# Patient Record
Sex: Male | Born: 1985 | Race: White | Hispanic: No | Marital: Single | Smoking: Never smoker
Health system: Southern US, Community
[De-identification: ages and names within clinical notes are randomized; demographics above are authoritative.]

## PROBLEM LIST (undated history)

## (undated) DIAGNOSIS — Z0000 Encounter for general adult medical examination without abnormal findings: Secondary | ICD-10-CM

## (undated) DIAGNOSIS — E785 Hyperlipidemia, unspecified: Secondary | ICD-10-CM

## (undated) DIAGNOSIS — R636 Underweight: Secondary | ICD-10-CM

## (undated) DIAGNOSIS — Z931 Gastrostomy status: Secondary | ICD-10-CM

## (undated) DIAGNOSIS — H6191 Disorder of right external ear, unspecified: Secondary | ICD-10-CM

## (undated) DIAGNOSIS — E079 Disorder of thyroid, unspecified: Secondary | ICD-10-CM

## (undated) DIAGNOSIS — R002 Palpitations: Secondary | ICD-10-CM

## (undated) DIAGNOSIS — R634 Abnormal weight loss: Secondary | ICD-10-CM

## (undated) DIAGNOSIS — E860 Dehydration: Secondary | ICD-10-CM

## (undated) DIAGNOSIS — G809 Cerebral palsy, unspecified: Secondary | ICD-10-CM

## (undated) DIAGNOSIS — E0590 Thyrotoxicosis, unspecified without thyrotoxic crisis or storm: Secondary | ICD-10-CM

## (undated) DIAGNOSIS — F418 Other specified anxiety disorders: Secondary | ICD-10-CM

## (undated) DIAGNOSIS — K219 Gastro-esophageal reflux disease without esophagitis: Secondary | ICD-10-CM

## (undated) HISTORY — DX: Abnormal weight loss: R63.4

## (undated) HISTORY — PX: SPINE SURGERY: SHX786

## (undated) HISTORY — DX: Gastrostomy status: Z93.1

## (undated) HISTORY — PX: TONSILLECTOMY: SUR1361

## (undated) HISTORY — DX: Other specified anxiety disorders: F41.8

## (undated) HISTORY — PX: EYE SURGERY: SHX253

## (undated) HISTORY — DX: Palpitations: R00.2

## (undated) HISTORY — DX: Encounter for general adult medical examination without abnormal findings: Z00.00

## (undated) HISTORY — DX: Dehydration: E86.0

## (undated) HISTORY — PX: HIP SURGERY: SHX245

## (undated) HISTORY — DX: Hyperlipidemia, unspecified: E78.5

## (undated) HISTORY — DX: Gastro-esophageal reflux disease without esophagitis: K21.9

## (undated) HISTORY — PX: SPINAL FUSION: SHX223

## (undated) HISTORY — DX: Underweight: R63.6

## (undated) HISTORY — DX: Disorder of right external ear, unspecified: H61.91

## (undated) HISTORY — DX: Disorder of thyroid, unspecified: E07.9

## (undated) HISTORY — PX: OTHER SURGICAL HISTORY: SHX169

---

## 2003-03-07 ENCOUNTER — Ambulatory Visit (HOSPITAL_COMMUNITY): Admission: RE | Admit: 2003-03-07 | Discharge: 2003-03-07 | Payer: Self-pay | Admitting: Pediatrics

## 2004-06-02 ENCOUNTER — Ambulatory Visit (HOSPITAL_COMMUNITY): Admission: RE | Admit: 2004-06-02 | Discharge: 2004-06-02 | Payer: Self-pay | Admitting: Endocrinology

## 2004-06-24 ENCOUNTER — Encounter (INDEPENDENT_AMBULATORY_CARE_PROVIDER_SITE_OTHER): Payer: Self-pay | Admitting: *Deleted

## 2004-06-25 ENCOUNTER — Encounter (INDEPENDENT_AMBULATORY_CARE_PROVIDER_SITE_OTHER): Payer: Self-pay | Admitting: *Deleted

## 2004-06-25 ENCOUNTER — Inpatient Hospital Stay (HOSPITAL_COMMUNITY): Admission: EM | Admit: 2004-06-25 | Discharge: 2004-06-27 | Payer: Self-pay | Admitting: Family Medicine

## 2004-09-16 ENCOUNTER — Observation Stay (HOSPITAL_COMMUNITY): Admission: EM | Admit: 2004-09-16 | Discharge: 2004-09-17 | Payer: Self-pay | Admitting: Emergency Medicine

## 2004-09-16 ENCOUNTER — Ambulatory Visit: Payer: Self-pay | Admitting: Internal Medicine

## 2004-09-16 ENCOUNTER — Ambulatory Visit: Payer: Self-pay | Admitting: Family Medicine

## 2004-09-16 ENCOUNTER — Ambulatory Visit: Payer: Self-pay | Admitting: Cardiology

## 2004-09-17 ENCOUNTER — Encounter: Payer: Self-pay | Admitting: Cardiology

## 2004-10-01 ENCOUNTER — Ambulatory Visit: Payer: Self-pay | Admitting: Family Medicine

## 2004-10-02 ENCOUNTER — Ambulatory Visit: Payer: Self-pay | Admitting: Family Medicine

## 2004-11-12 ENCOUNTER — Ambulatory Visit: Payer: Self-pay | Admitting: Family Medicine

## 2004-11-18 ENCOUNTER — Ambulatory Visit: Payer: Self-pay | Admitting: Gastroenterology

## 2004-11-25 ENCOUNTER — Encounter: Payer: Self-pay | Admitting: Gastroenterology

## 2004-11-25 ENCOUNTER — Ambulatory Visit (HOSPITAL_COMMUNITY): Admission: RE | Admit: 2004-11-25 | Discharge: 2004-11-25 | Payer: Self-pay | Admitting: Gastroenterology

## 2004-12-09 ENCOUNTER — Ambulatory Visit: Payer: Self-pay | Admitting: Family Medicine

## 2004-12-15 ENCOUNTER — Ambulatory Visit: Payer: Self-pay | Admitting: Cardiology

## 2005-01-06 IMAGING — US US SOFT TISSUE HEAD/NECK
1 series · 14 of 25 positions shown · non-contrast
Comparison: none

CLINICAL DATA: Mild hyperthyroidism.
 US THYROID
 Multiplanar imaging shows the overall size of the gland to be within normal limits.  The right lobe is 4.2 x 1.9 x 1.4 cm.  The left is 3.7 x 1.4 x 1.3 cm.  The gland is homogeneous with no focal lesions.
 IMPRESSION
 Thyroid gland is sonographically normal.

[Series 1: unknown · 0.09mm/px · 14 of 38 slices shown]
[im 1/38]
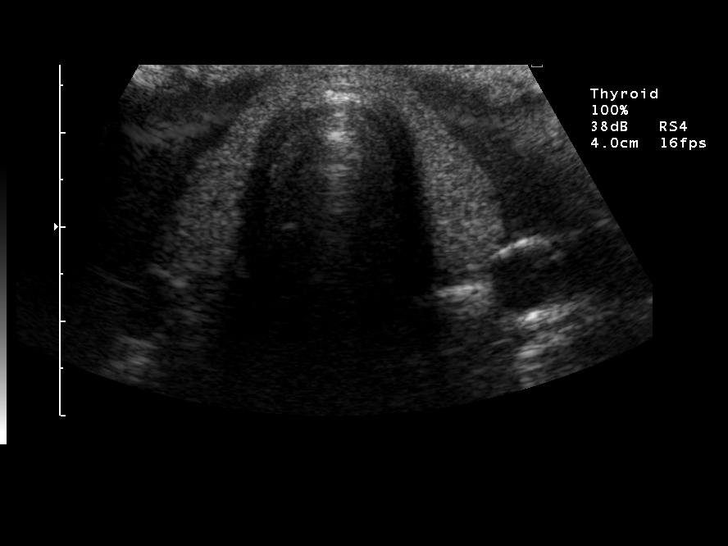
[im 4/38]
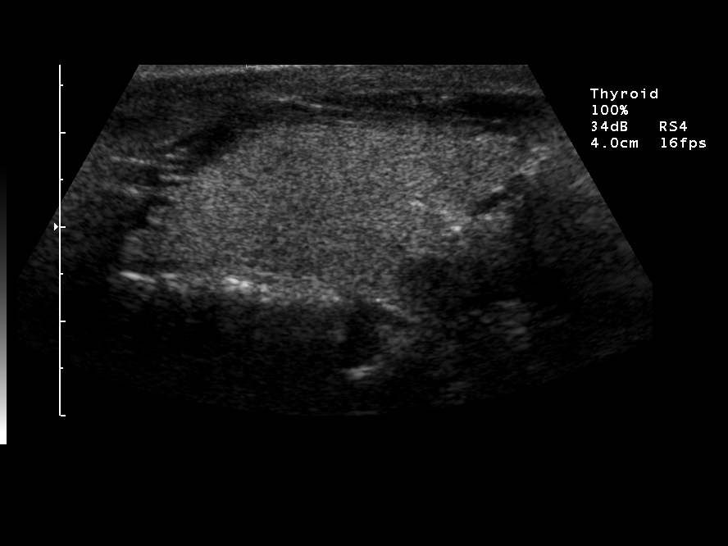
[im 7/38]
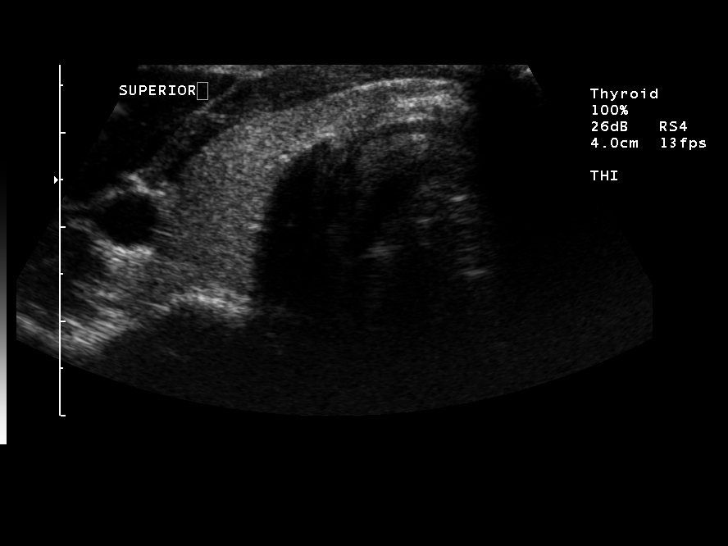
[im 10/38]
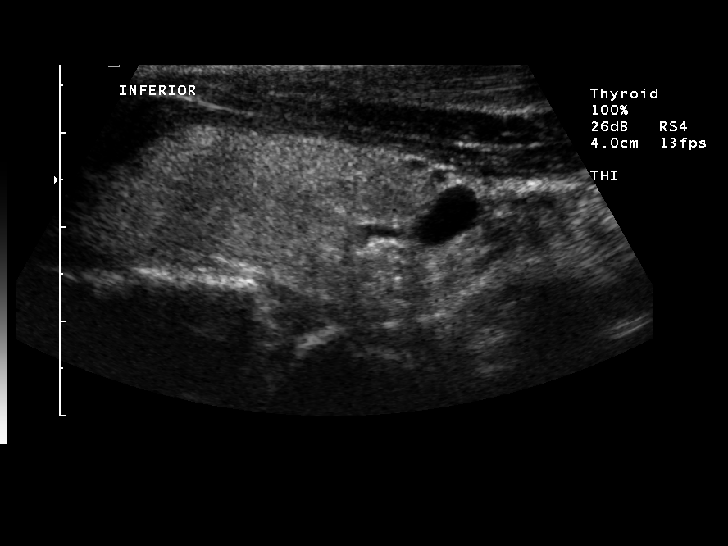
[im 13/38]
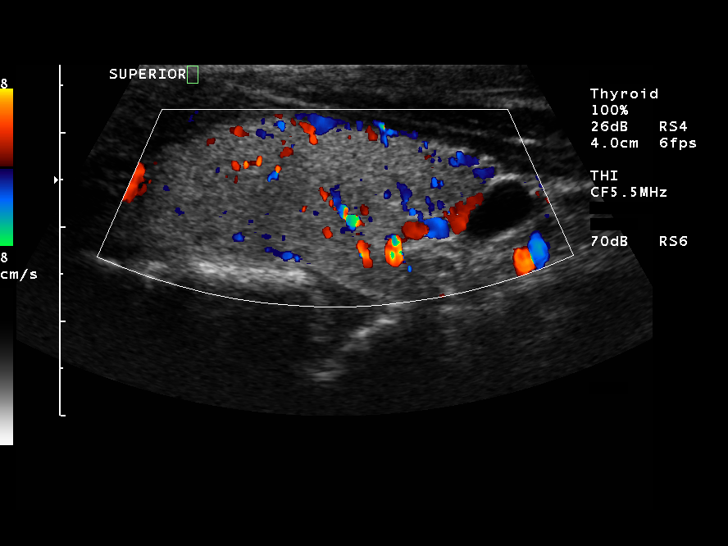
[im 14/38]
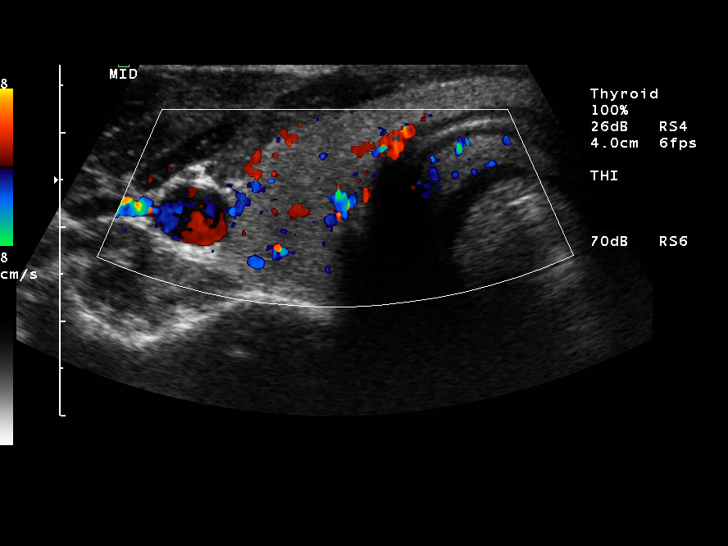
[im 17/38]
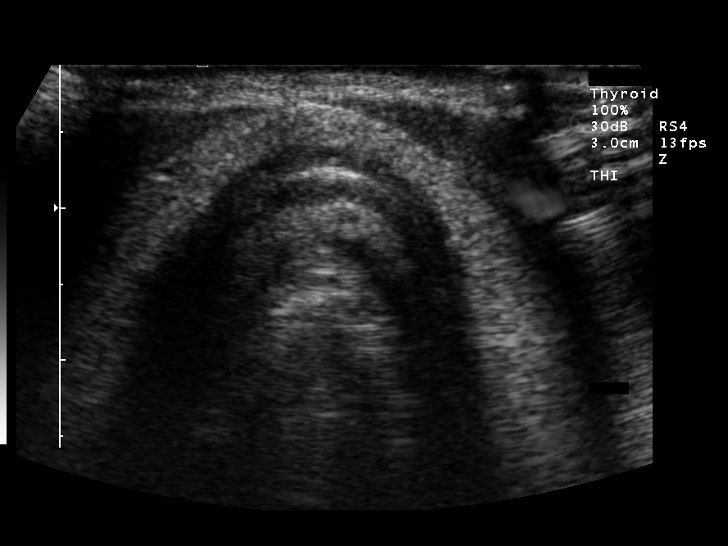
[im 21/38]
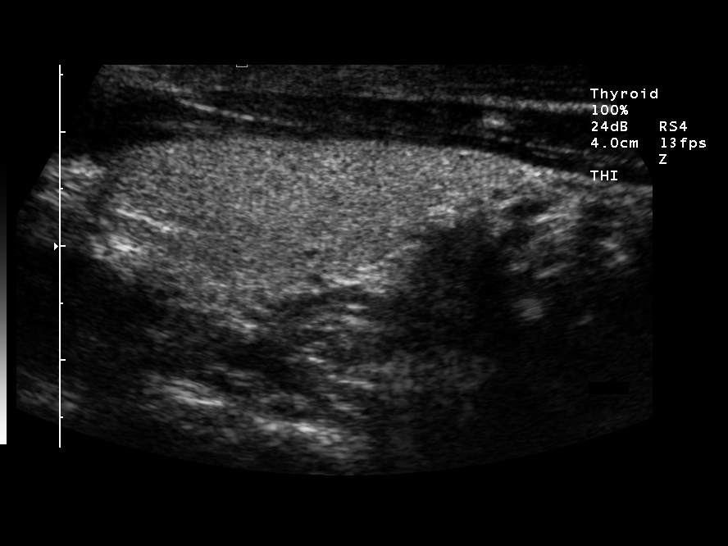
[im 24/38]
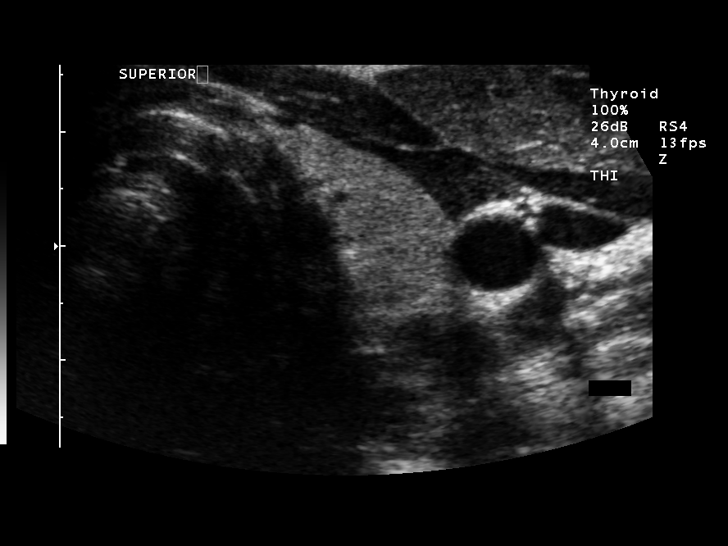
[im 25/38]
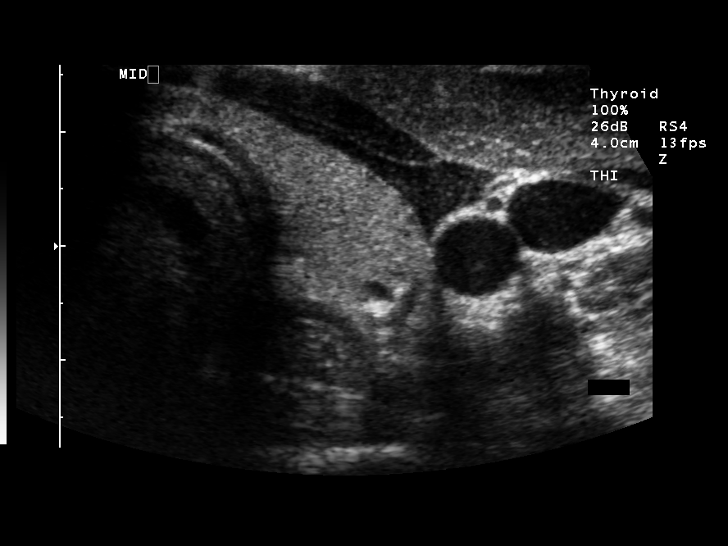
[im 28/38]
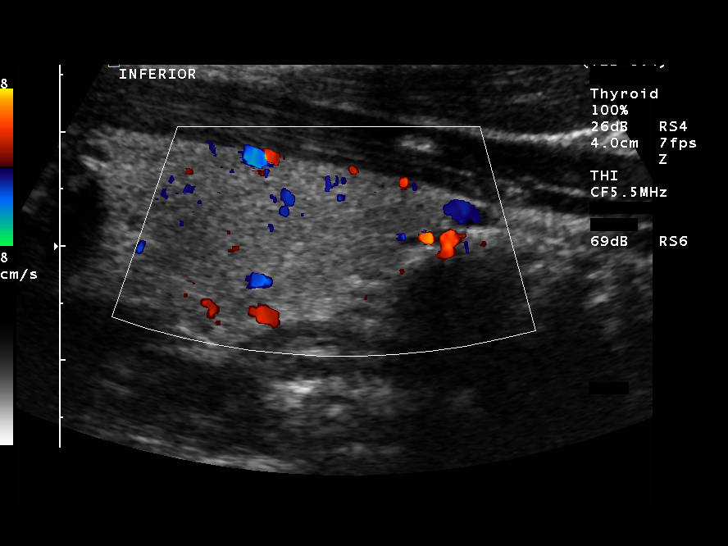
[im 31/38]
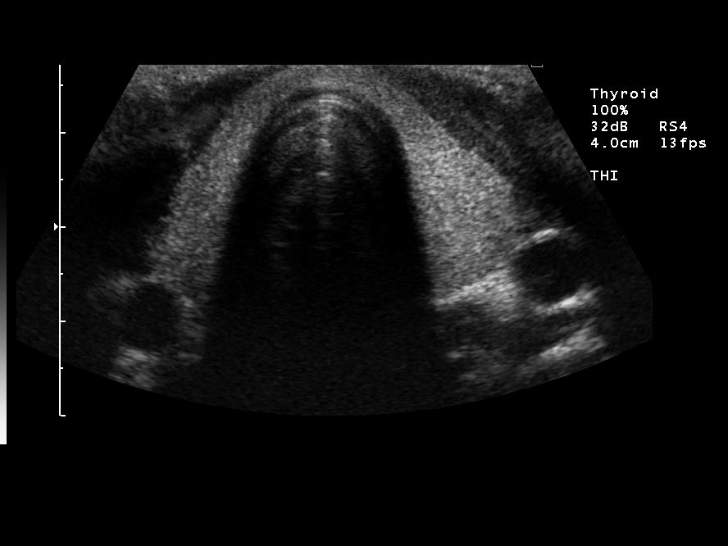
[im 34/38]
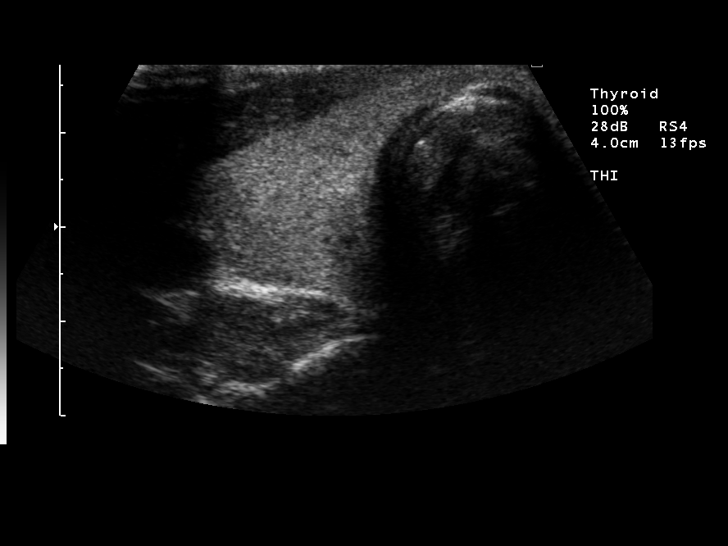
[im 38/38]
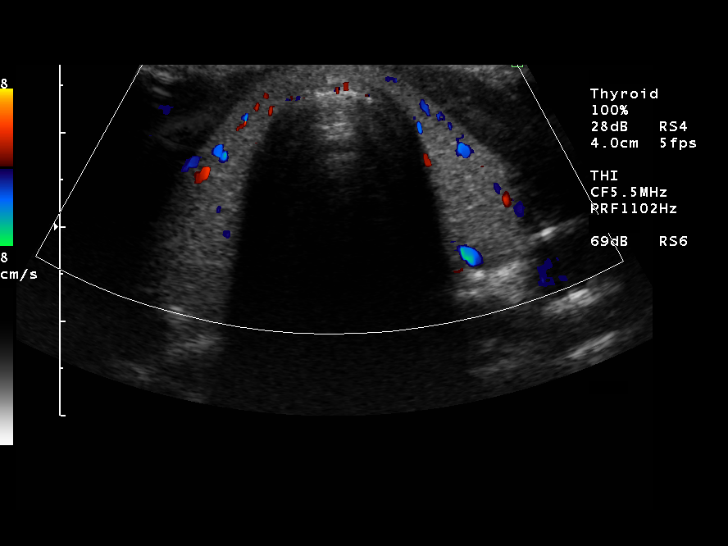

[14 of 25 positions shown; findings below may reference images not displayed]

## 2005-01-28 IMAGING — CR DG ABDOMEN ACUTE W/ 1V CHEST
3 series · 3 of 3 positions shown · non-contrast
Comparison: none

CLINICAL DATA: Abdominal pain.
 ACUTE ABDOMEN SERIES WITH CHEST 06/24/04 
 The patient is status post placement of Harrington rods in the thoracic and lumbar spines.  There is still moderate scoliosis.  The heart is normal size.  The lungs are clear.  
 There is a nonobstructive bowel gas pattern.  A moderate amount of stool in the colon.  No free air or evidence of organomegaly.  No abnormal calcification.
 IMPRESSION
 1.  No obstruction or free air. 
 2.  Spinal rods in place.

[view not recorded (1 of 3)]
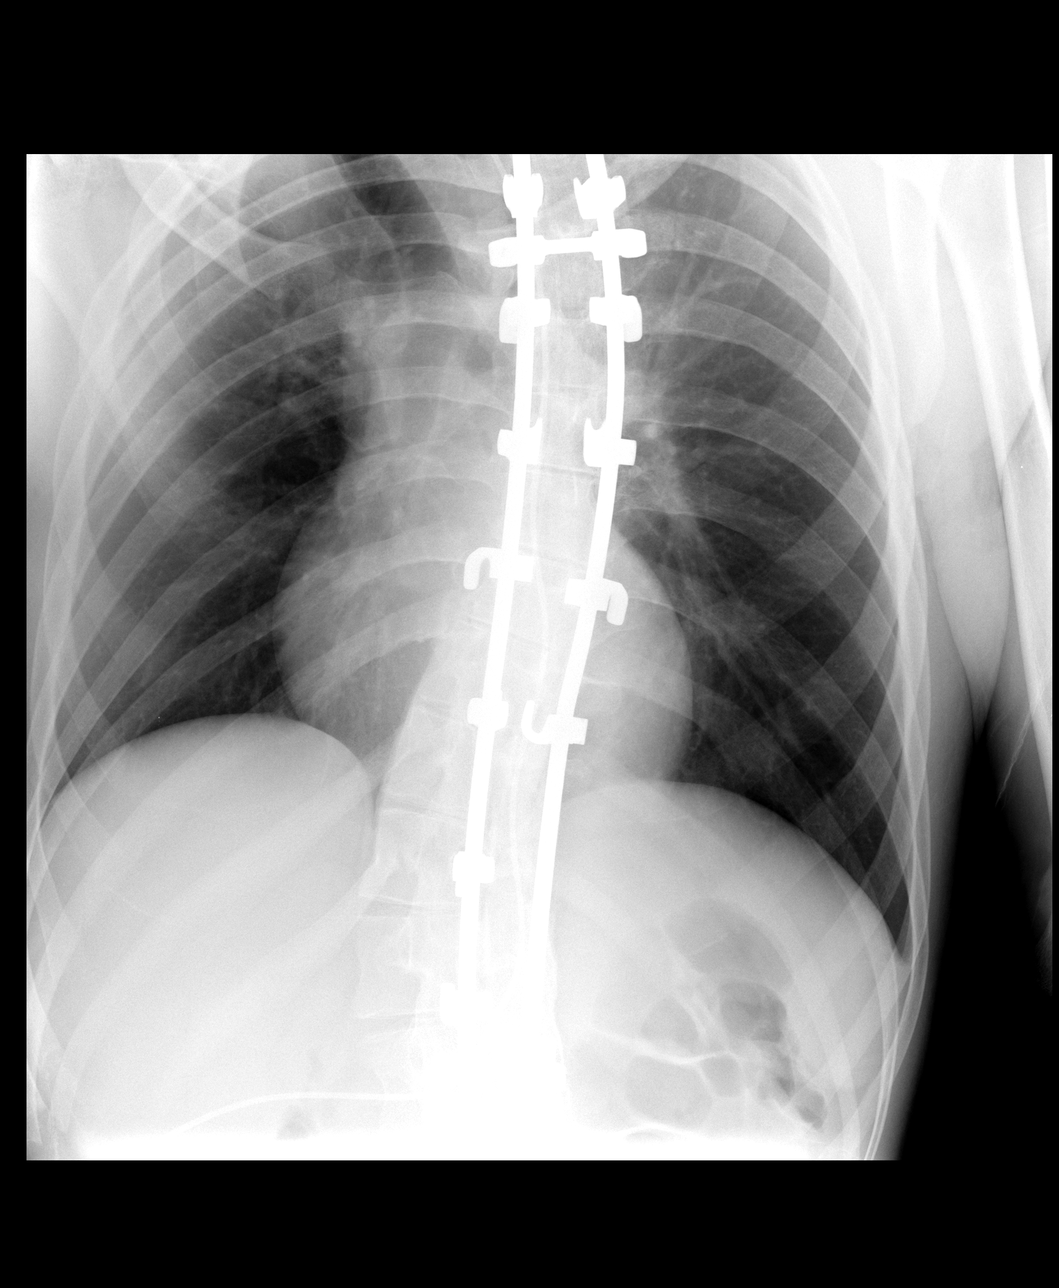

[view not recorded (2 of 3)]
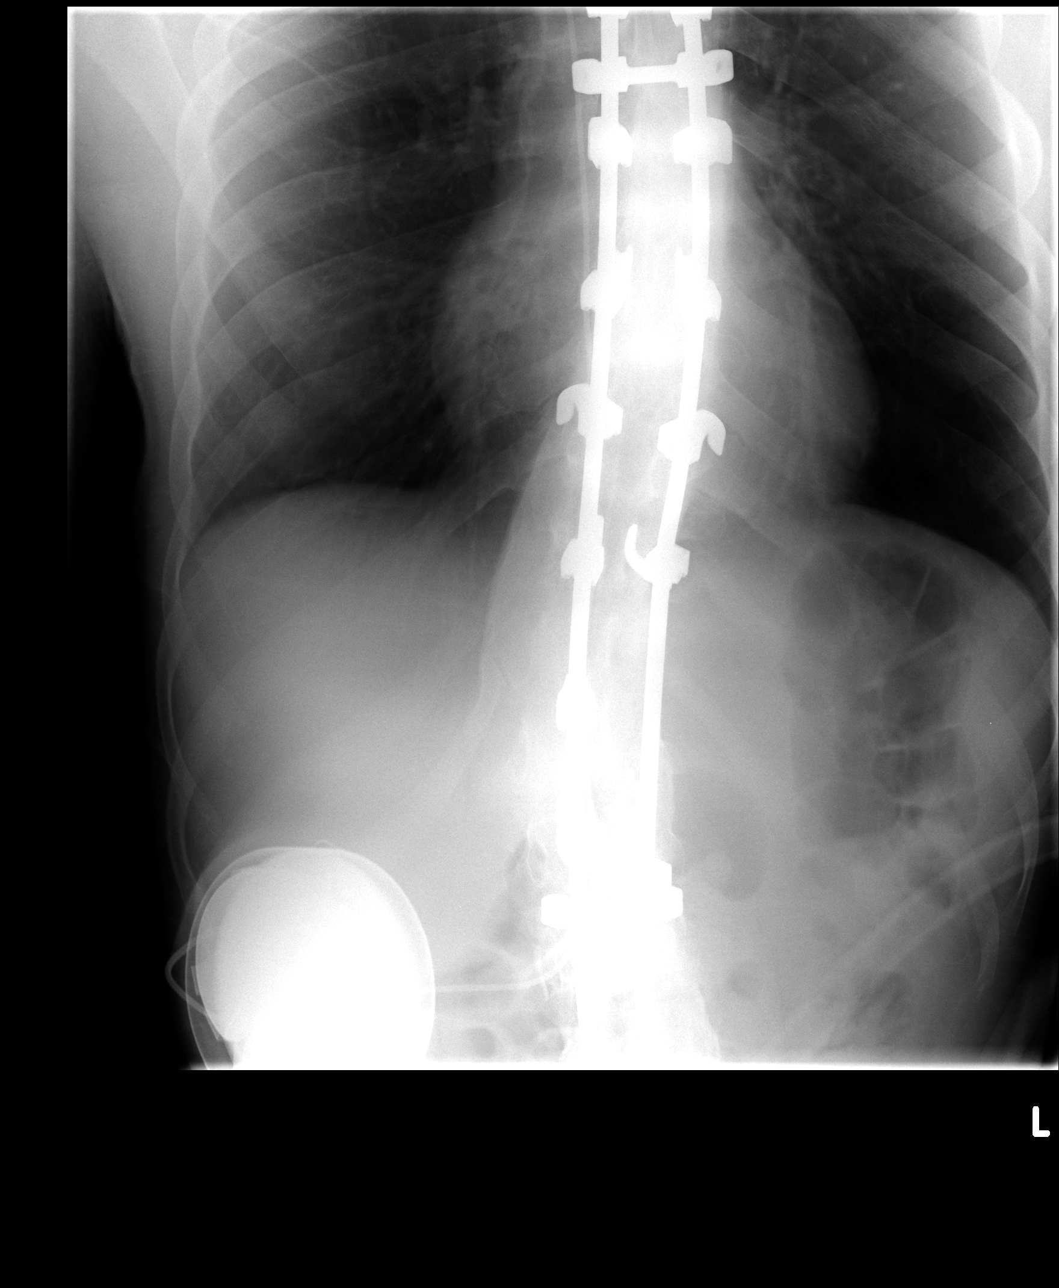

[view not recorded (3 of 3)]
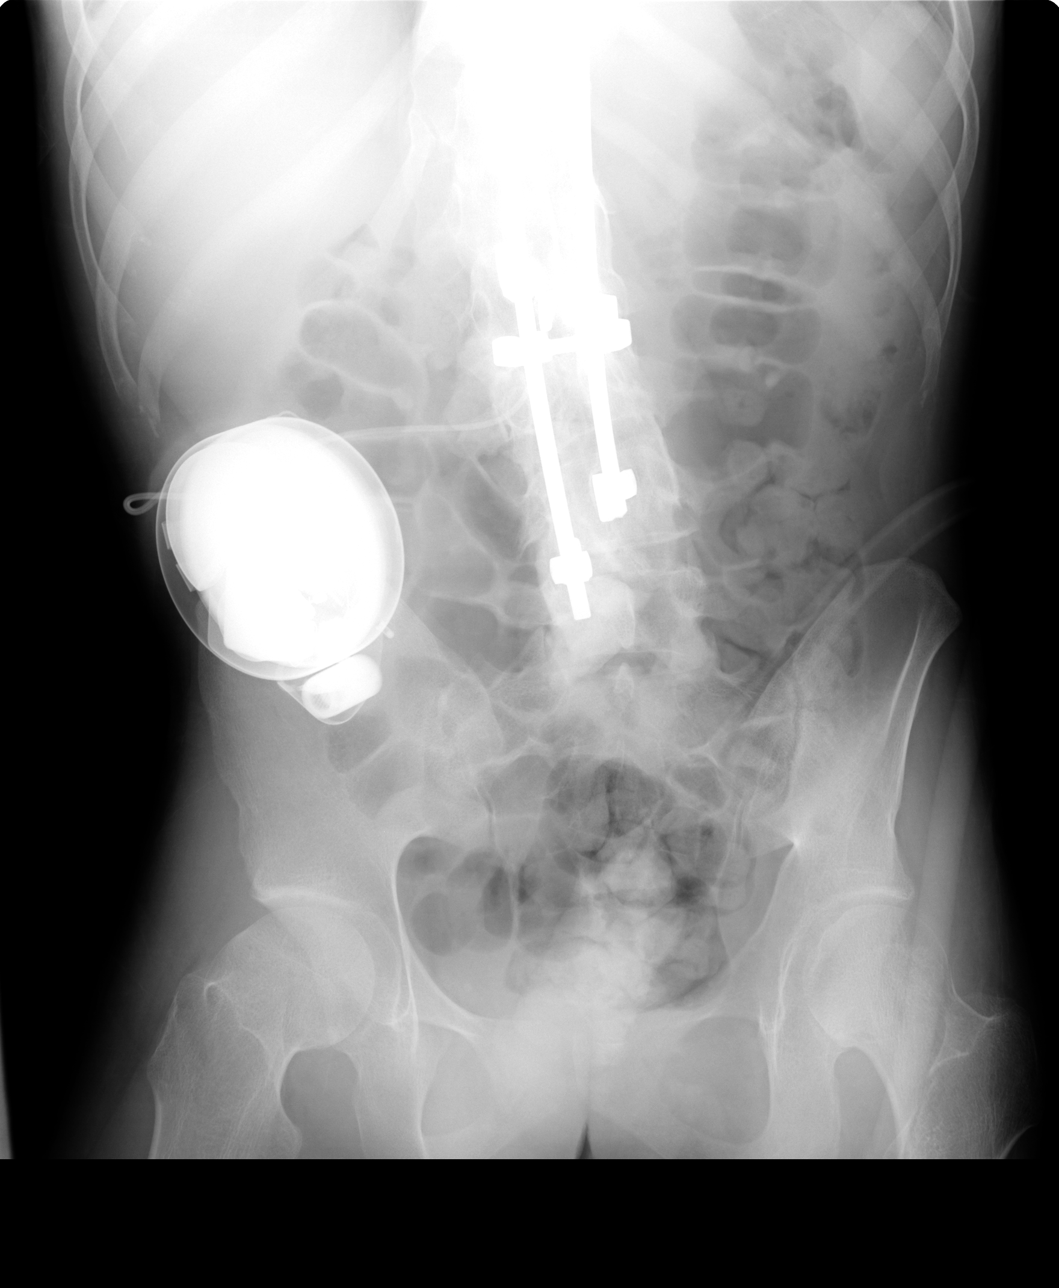

[3 of 3 positions shown; findings below may reference images not displayed]

## 2005-01-28 IMAGING — US US ABDOMEN COMPLETE
1 series · 14 of 25 positions shown · non-contrast
Comparison: none

CLINICAL DATA: Abdominal pain.
 COMPLETE ABDOMINAL ULTRASOUND 06/24/04
 The gallbladder is unremarkable. No evidence of cholelithiasis or acute cholecystitis.  Common bile duct is normal at 3.7 mm.  
 The visualized liver, pancreas, spleen, and kidneys, as well as abdominal aorta, are unremarkable.  The right kidney measures 10.2 cm.  The left kidney measures 9.9 cm.  
 Unable to see the IVC due to overlying bowel gas.  
 IMPRESSION
 Unremarkable abdominal ultrasound.

[Series 1: abdomen · 0.25mm/px · 14 of 68 slices shown]
[im 1/68]
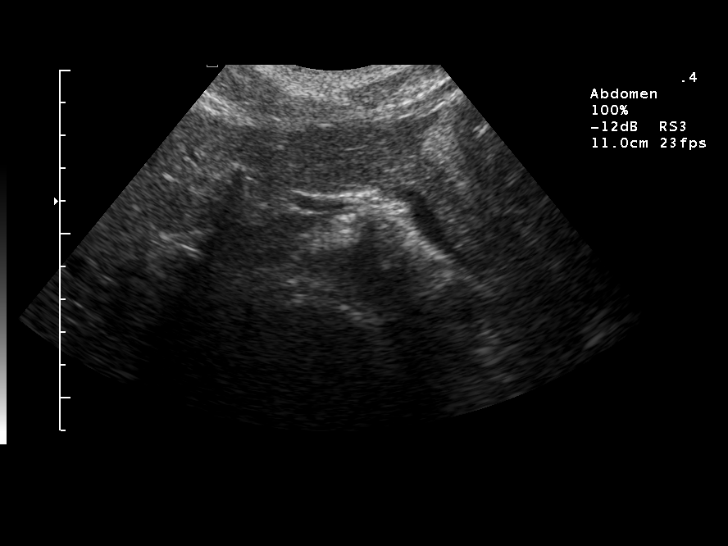
[im 6/68]
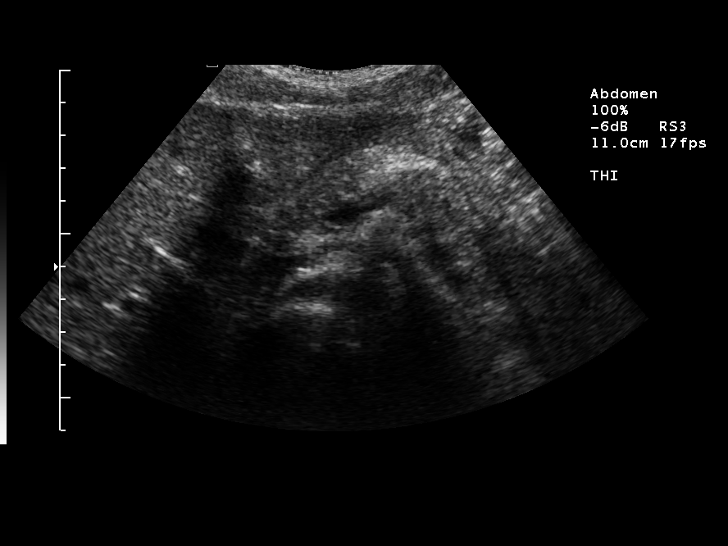
[im 12/68]
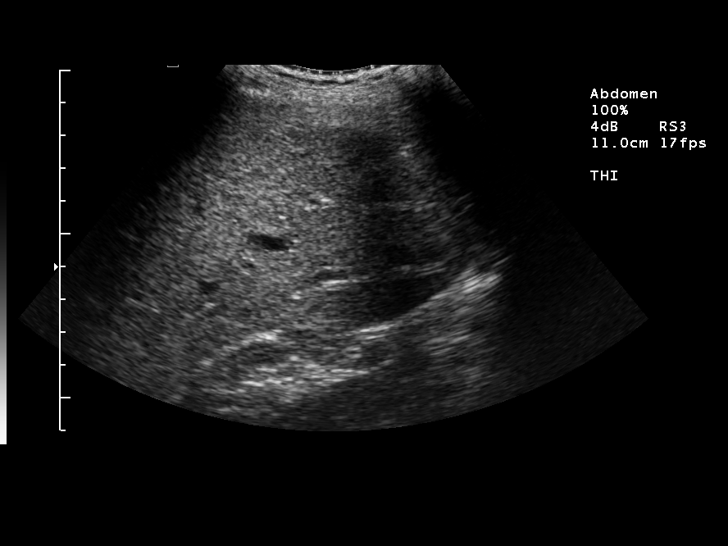
[im 17/68]
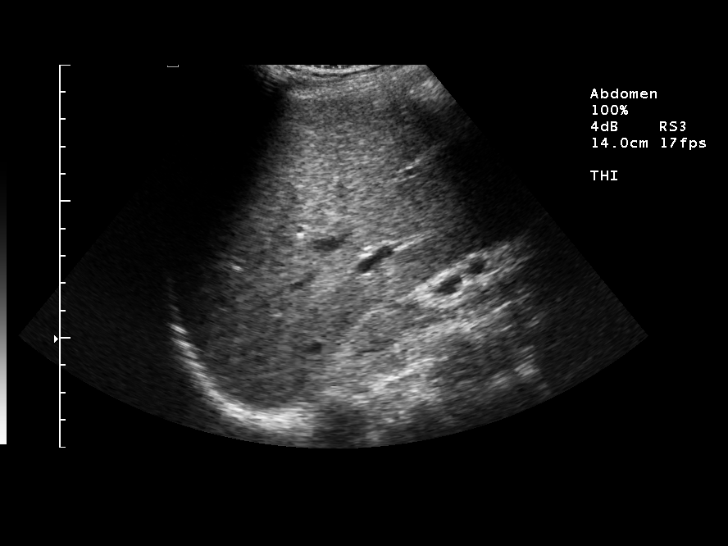
[im 23/68]
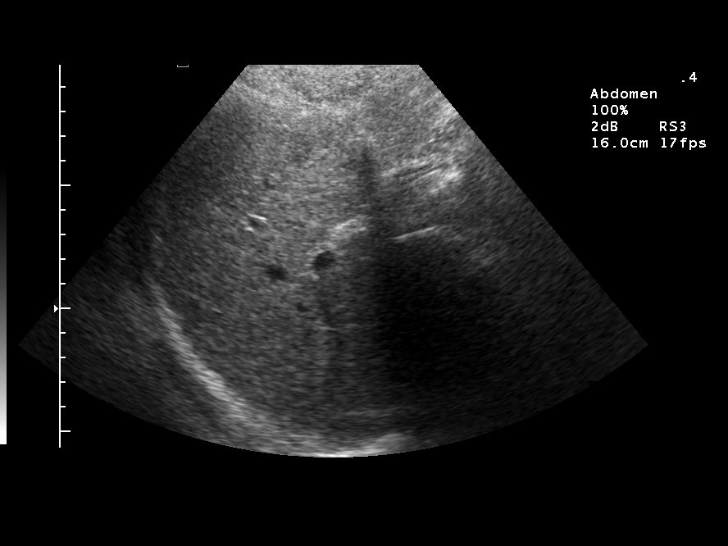
[im 26/68]
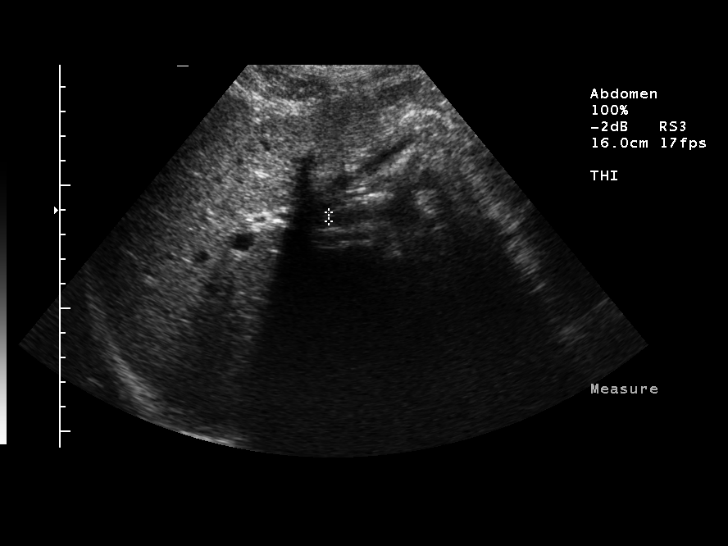
[im 31/68]
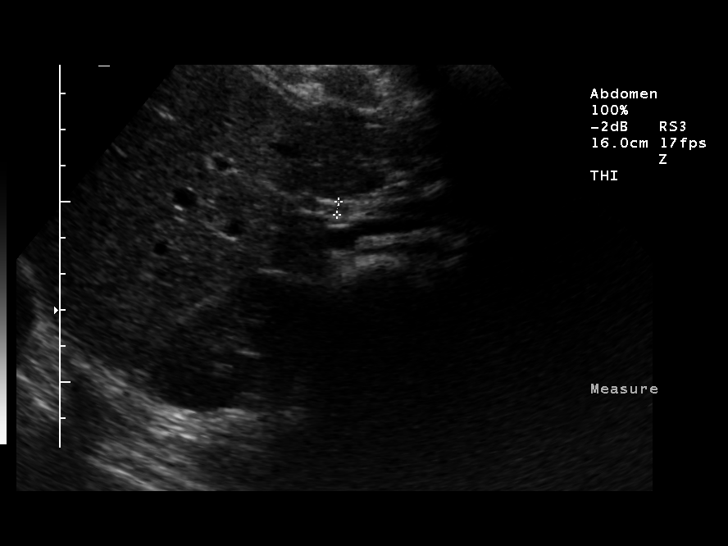
[im 37/68]
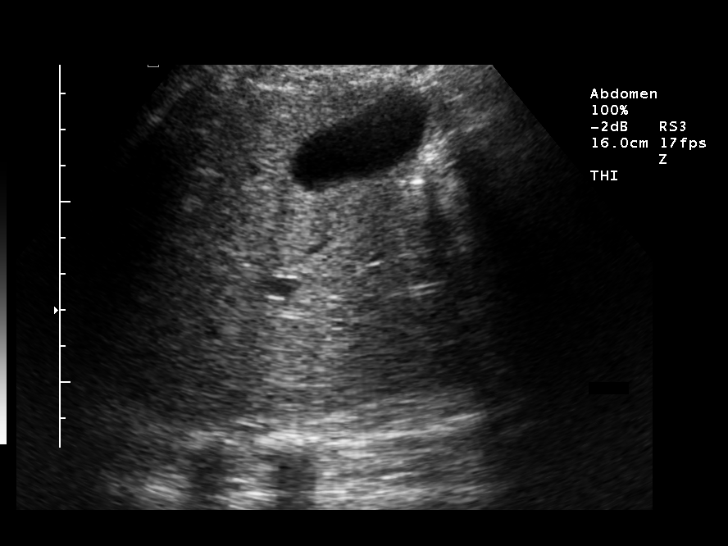
[im 42/68]
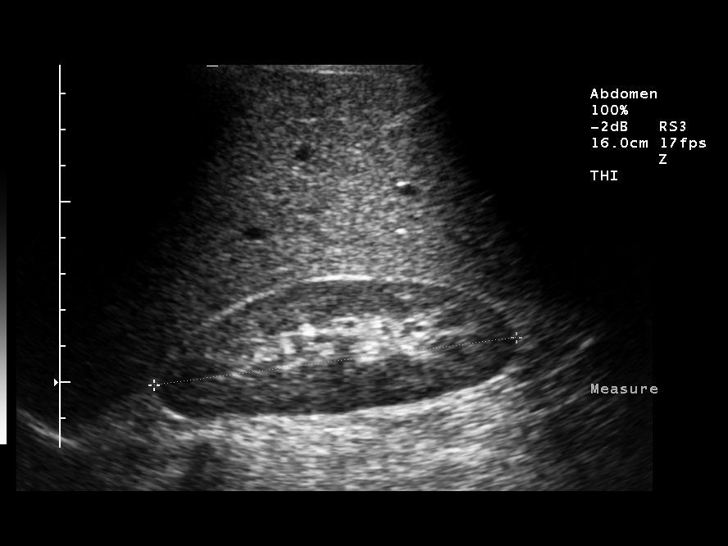
[im 45/68]
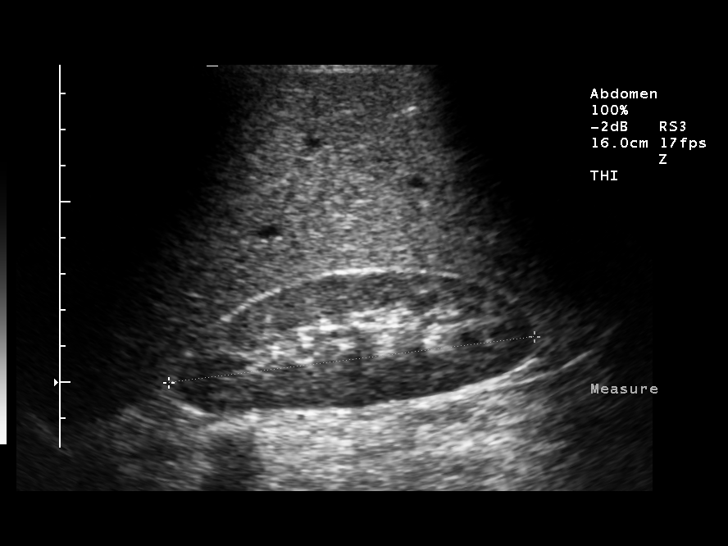
[im 51/68]
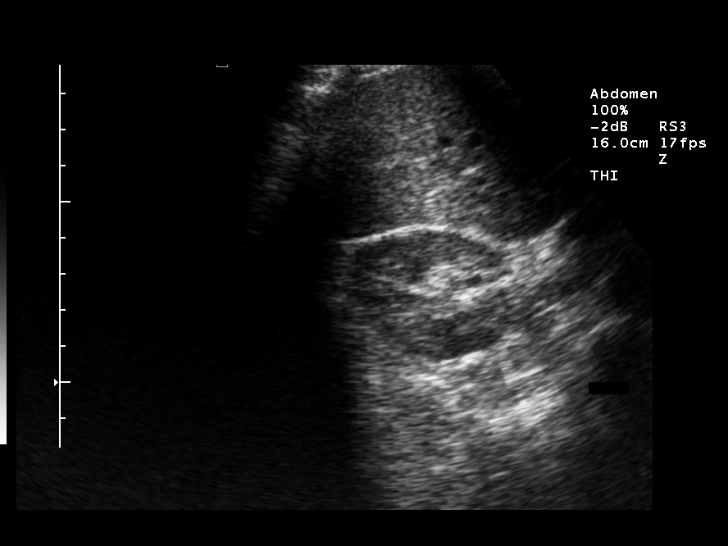
[im 56/68]
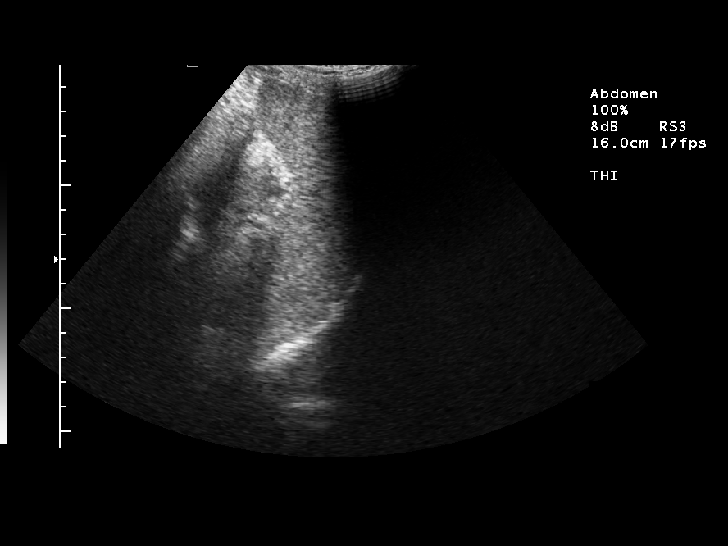
[im 62/68]
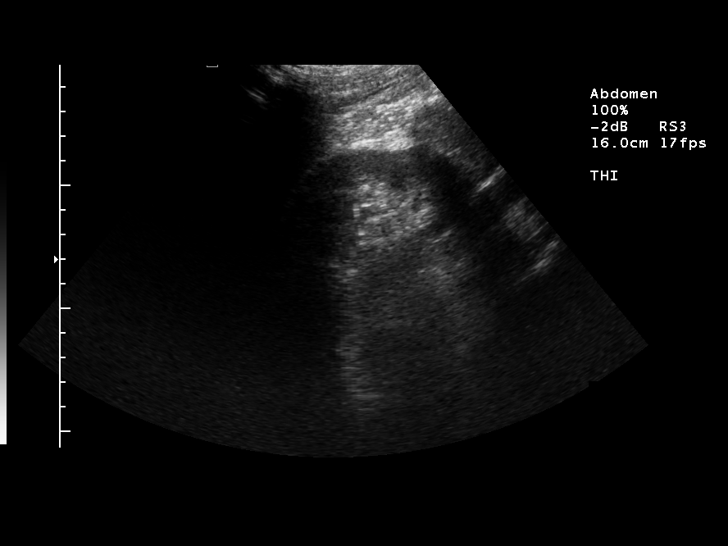
[im 68/68]
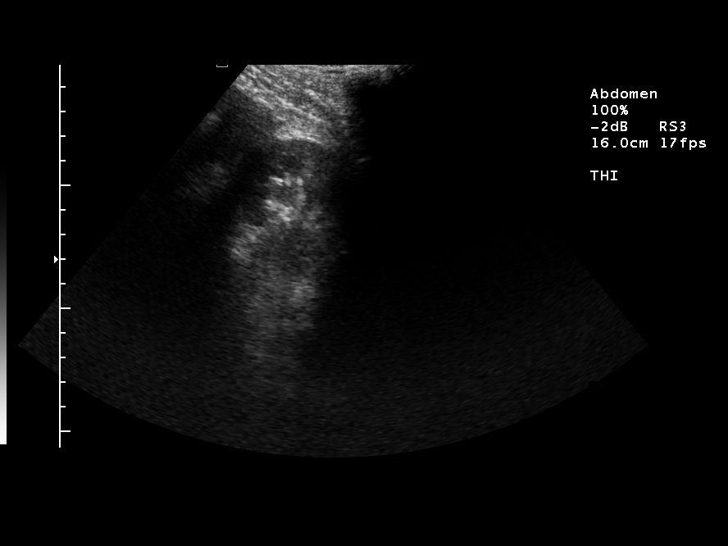

[14 of 25 positions shown; findings below may reference images not displayed]

## 2005-02-01 ENCOUNTER — Ambulatory Visit: Payer: Self-pay | Admitting: Family Medicine

## 2005-02-24 ENCOUNTER — Ambulatory Visit: Payer: Self-pay | Admitting: Family Medicine

## 2005-02-26 ENCOUNTER — Ambulatory Visit: Payer: Self-pay | Admitting: Family Medicine

## 2005-03-02 ENCOUNTER — Encounter: Admission: RE | Admit: 2005-03-02 | Discharge: 2005-03-02 | Payer: Self-pay | Admitting: Family Medicine

## 2005-03-03 ENCOUNTER — Ambulatory Visit: Payer: Self-pay | Admitting: Family Medicine

## 2005-03-03 ENCOUNTER — Ambulatory Visit: Payer: Self-pay | Admitting: Internal Medicine

## 2005-03-03 ENCOUNTER — Inpatient Hospital Stay (HOSPITAL_COMMUNITY): Admission: AD | Admit: 2005-03-03 | Discharge: 2005-03-06 | Payer: Self-pay | Admitting: Family Medicine

## 2005-03-04 ENCOUNTER — Encounter (INDEPENDENT_AMBULATORY_CARE_PROVIDER_SITE_OTHER): Payer: Self-pay | Admitting: *Deleted

## 2005-03-05 ENCOUNTER — Ambulatory Visit: Payer: Self-pay | Admitting: Internal Medicine

## 2005-04-15 ENCOUNTER — Ambulatory Visit: Payer: Self-pay | Admitting: Family Medicine

## 2005-04-22 IMAGING — CR DG CHEST 1V PORT
1 series · 1 of 1 positions shown · non-contrast
Comparison: none

CLINICAL DATA: 18-year-old with chest pain. 
 PORTABLE CHEST ? ONE VIEW:
 A single portable view of the chest without prior films for comparison demonstrates Harrington rods in the thoracolumbar spine.  There is a ventriculoperitoneal shunt catheter coursing down into the abdomen.  Heart size is normal.  Lungs are clear.

[view not recorded]
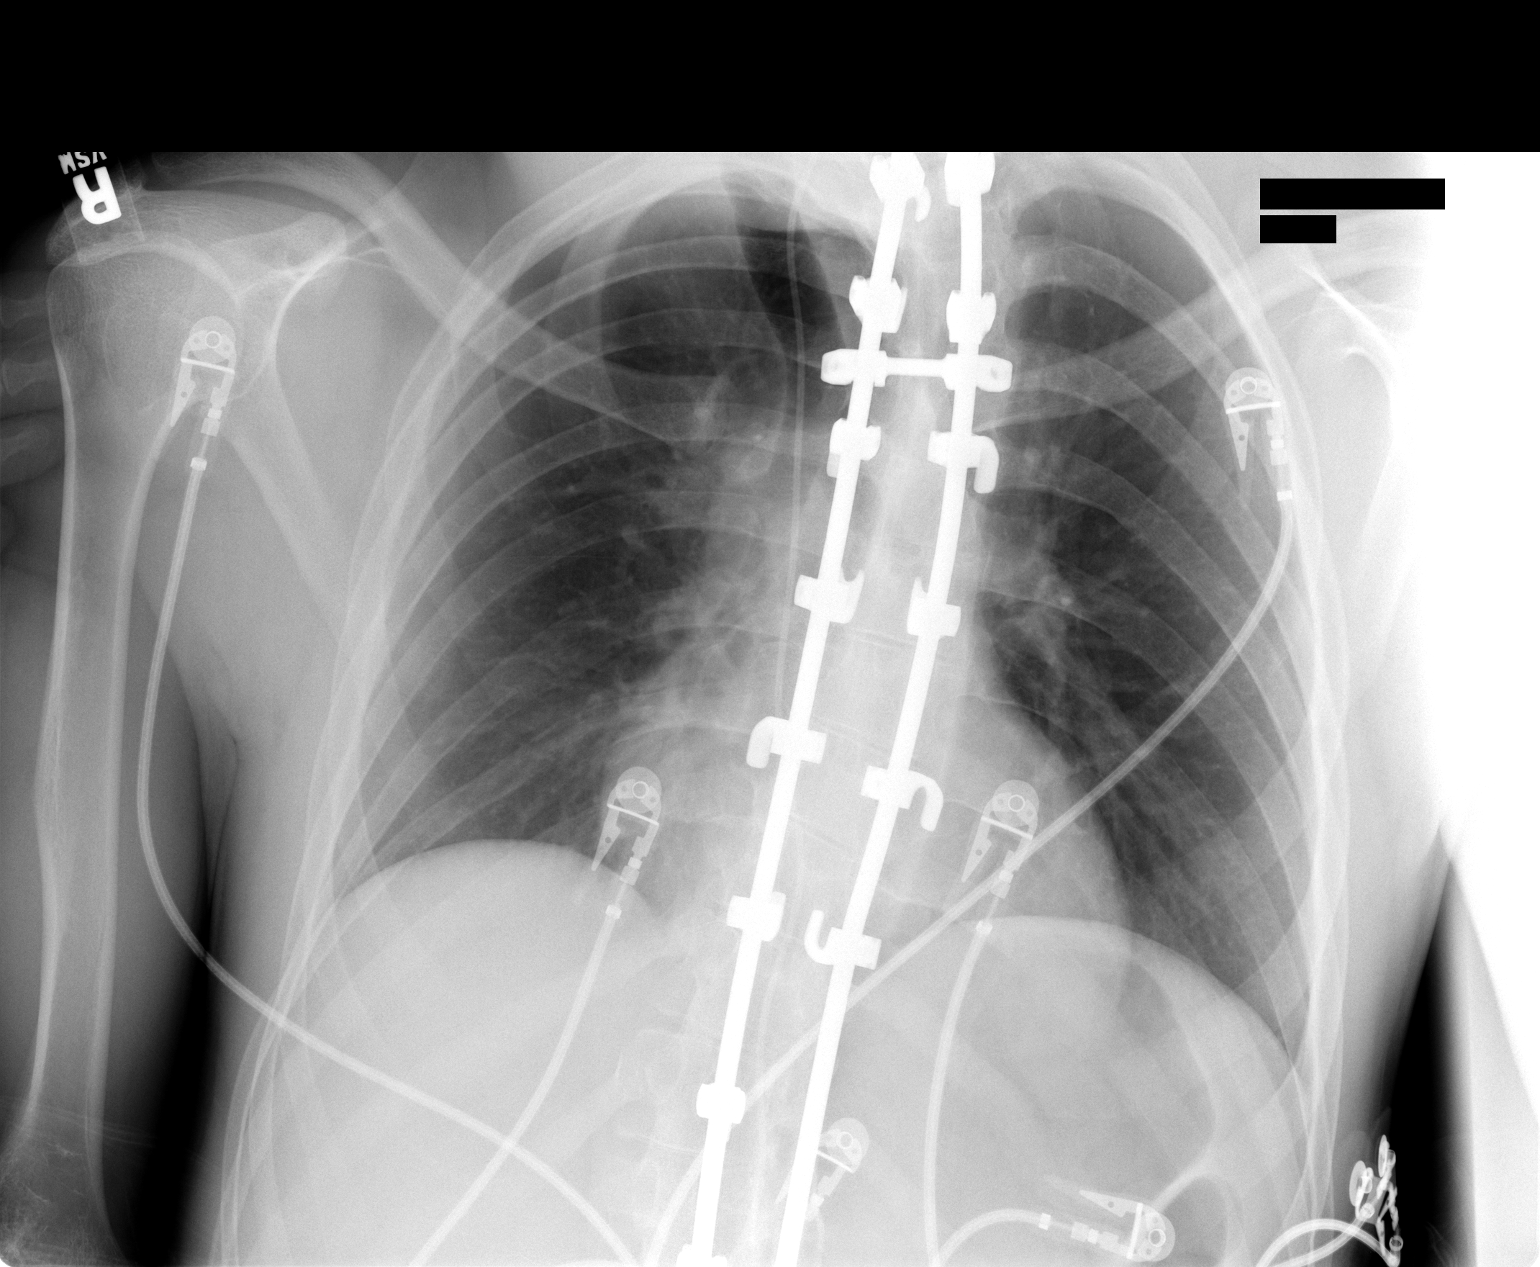

[1 of 1 positions shown; findings below may reference images not displayed]

IMPRESSION: 1.  No acute pulmonary finding.

## 2005-05-04 ENCOUNTER — Emergency Department (HOSPITAL_COMMUNITY): Admission: EM | Admit: 2005-05-04 | Discharge: 2005-05-04 | Payer: Self-pay | Admitting: Emergency Medicine

## 2005-05-07 ENCOUNTER — Ambulatory Visit: Payer: Self-pay | Admitting: Family Medicine

## 2005-05-12 ENCOUNTER — Ambulatory Visit: Payer: Self-pay | Admitting: Family Medicine

## 2005-05-27 ENCOUNTER — Ambulatory Visit: Payer: Self-pay | Admitting: Internal Medicine

## 2005-06-02 ENCOUNTER — Encounter (INDEPENDENT_AMBULATORY_CARE_PROVIDER_SITE_OTHER): Payer: Self-pay | Admitting: *Deleted

## 2005-06-02 HISTORY — PX: OTHER SURGICAL HISTORY: SHX169

## 2005-06-16 ENCOUNTER — Ambulatory Visit: Payer: Self-pay | Admitting: Family Medicine

## 2005-07-01 IMAGING — RF DG BE W/ CM - WO/W KUB
14 of 24 series · 14 of 24 positions shown · non-contrast
Comparison: none

CLINICAL DATA: Abdominal pain, decrease in appetite, reflux.  Left lower quadrant pain, constipation.
 BARIUM ENEMA:
 Preliminary KUB revealed much retained feces throughout the colon.  The patient was given several enemas.
 No obstruction is noted to the retrograde flow of barium with the complete colon being filled with barium including the cecum.  No reflux of the terminal ileum.  There is noted to be much retained feces throughout the entire colon with probable fecal impaction in the rectum.  There is no definite obstruction in the rectosigmoid; however, there is a prominent curvature of the rectosigmoid area which may lead to some partial obstruction and impaction.  There is also noted to be tortuosity of the sigmoid colon and transverse colon, but again no definite area of obstruction.

[Series 1: run · 1 of 1 slices shown (1 of 10)]
[im 1/1]
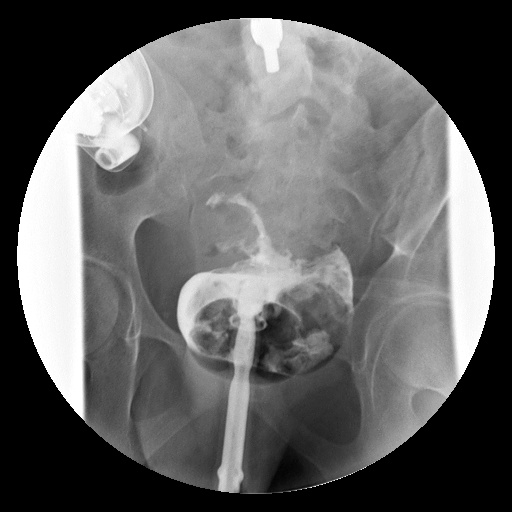

[Series 3: run · 1 of 1 slices shown (2 of 10)]
[im 1/1]
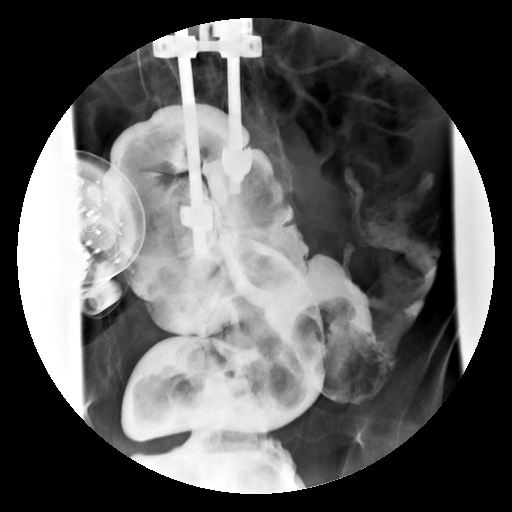

[Series 5: run · 1 of 1 slices shown (3 of 10)]
[im 1/1]
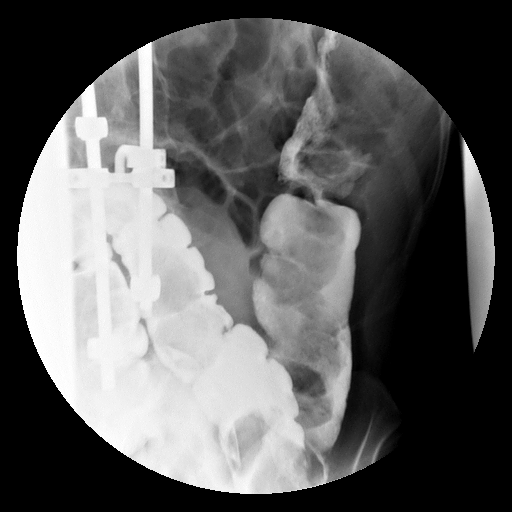

[Series 7: run · 1 of 1 slices shown (4 of 10)]
[im 1/1]
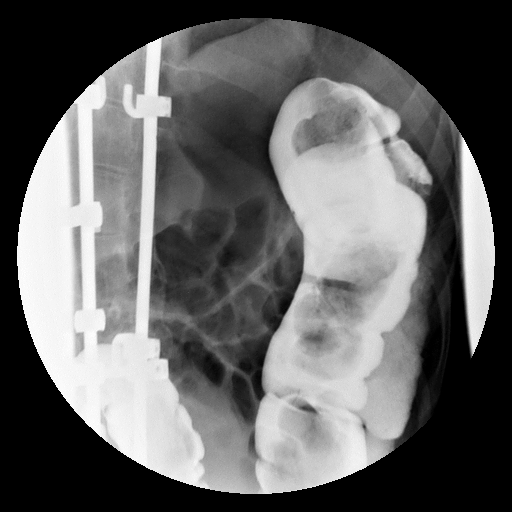

[Series 8: run · 1 of 1 slices shown (5 of 10)]
[im 1/1]
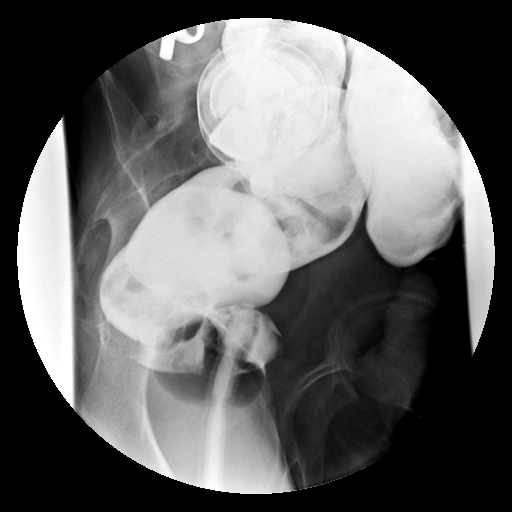

[Series 10: run · 1 of 1 slices shown (6 of 10)]
[im 1/1]
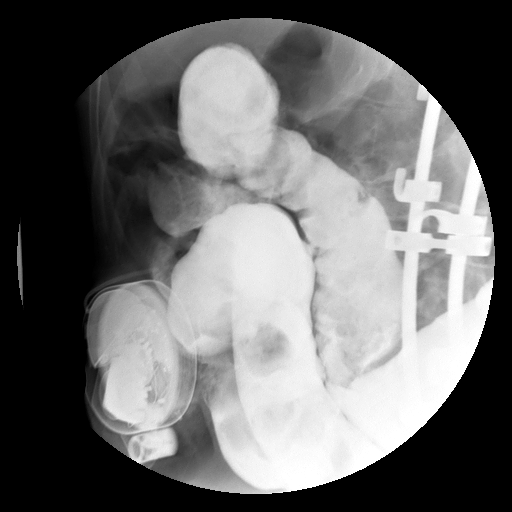

[Series 12: run · 1 of 1 slices shown (7 of 10)]
[im 1/1]
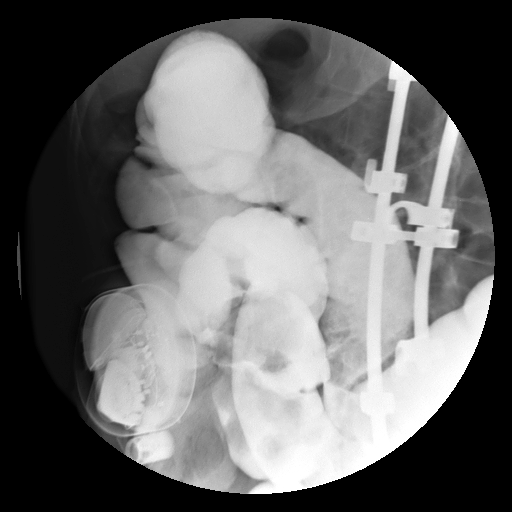

[Series 13: run · 1 of 1 slices shown (8 of 10)]
[im 1/1]
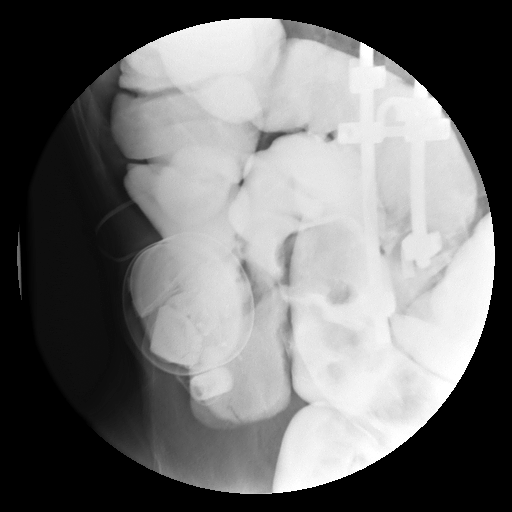

[Series 15: run · 1 of 1 slices shown (9 of 10)]
[im 1/1]
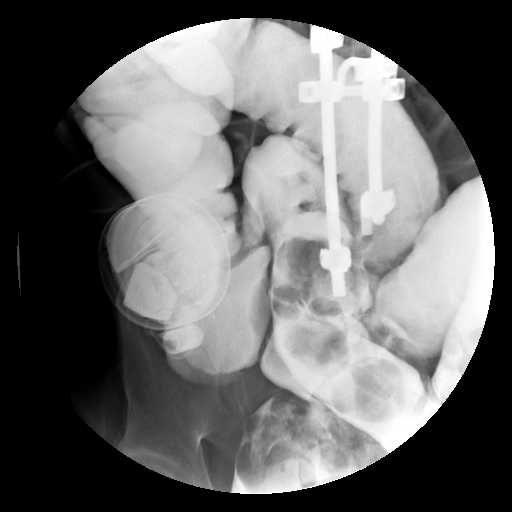

[Series 17: run · 1 of 1 slices shown (10 of 10)]
[im 1/1]
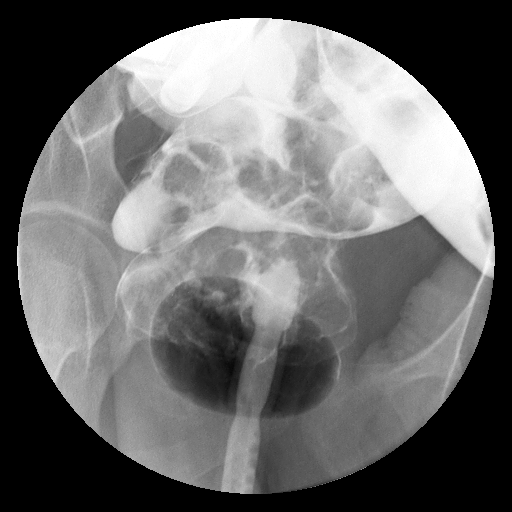

[Series 1001: view not recorded · 0.20mm/px · 1 of 1 slices shown (1 of 4)]
[im 1/1]
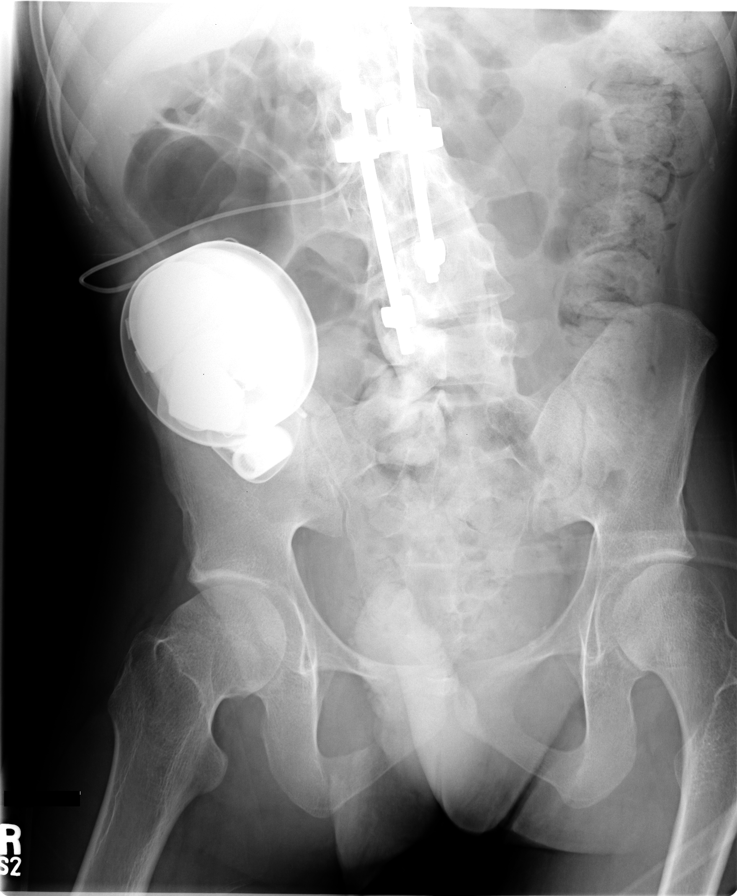

[Series 1002: view not recorded · 0.20mm/px · 1 of 1 slices shown (2 of 4)]
[im 1/1]
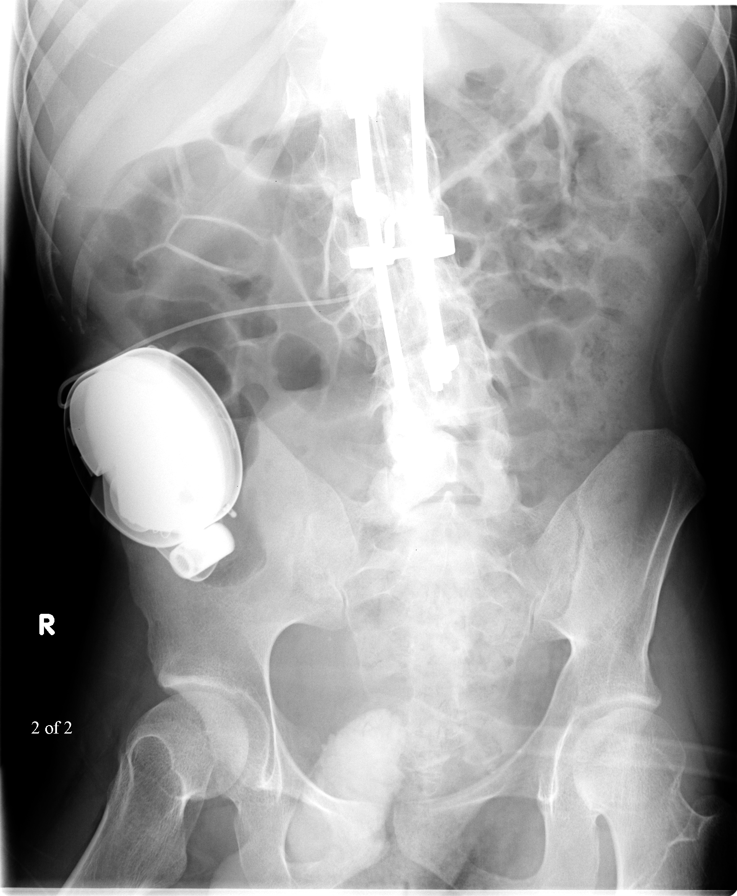

[Series 1004: view not recorded · 0.20mm/px · 1 of 1 slices shown (3 of 4)]
[im 1/1]
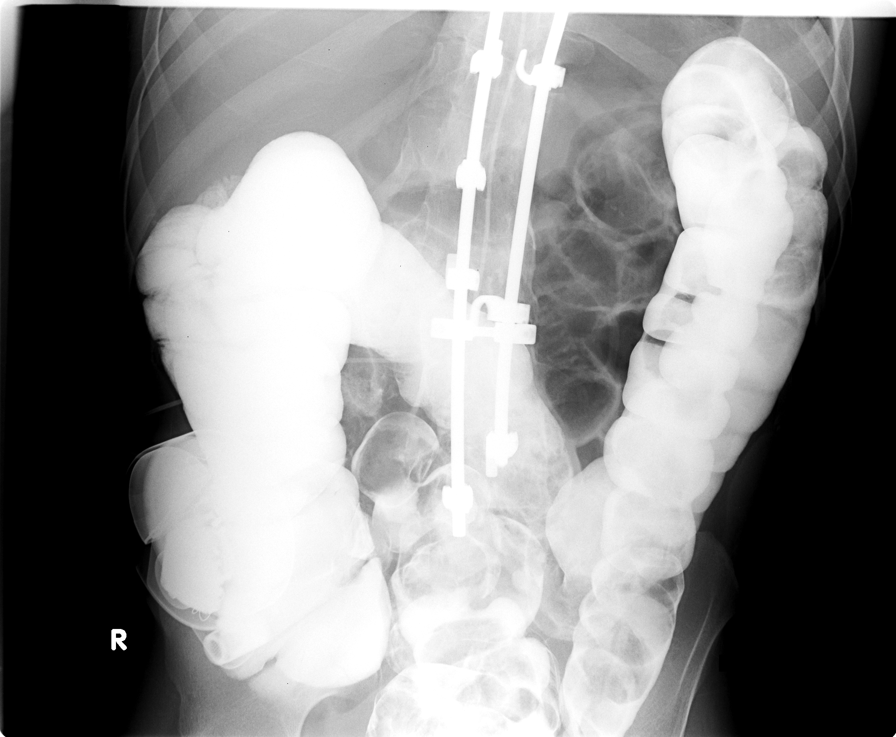

[Series 1006: view not recorded · 0.20mm/px · 1 of 1 slices shown (4 of 4)]
[im 1/1]
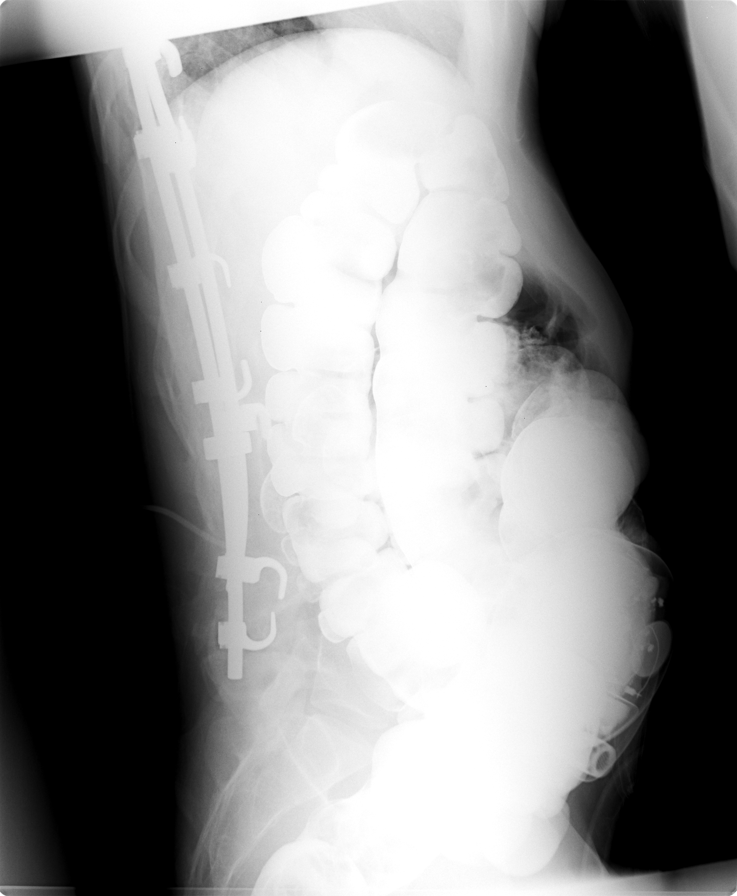

[14 of 24 positions shown; findings below may reference images not displayed]

IMPRESSION: Much retained feces throughout the entire colon, particularly the distal colon, with impaction in the rectosigmoid.  There is a curvature of the rectosigmoid area which could lead to some impaction; however, no definite obstruction is noted at this level to the retrograde flow of barium.  Postevacuation film reveals poor emptying of the colon with much retained feces in the colon with again feces noted in the region of the distal rectum.  No obstructive site is identified.

## 2005-07-21 ENCOUNTER — Encounter: Admission: RE | Admit: 2005-07-21 | Discharge: 2005-10-19 | Payer: Self-pay | Admitting: Family Medicine

## 2005-09-17 ENCOUNTER — Ambulatory Visit: Payer: Self-pay | Admitting: Family Medicine

## 2005-10-06 IMAGING — CR DG CHEST 1V
1 series · 1 of 1 positions shown · non-contrast
Comparison: none

CLINICAL DATA: Fever.
 CHEST X-RAY:
 A single view of the chest is compared to a chest x-ray of 09/16/04.  The lungs are clear. The heart is within normal limits in size.  Harrington rods are noted in the thoracic spine and a VP shunt is present.

[view not recorded]
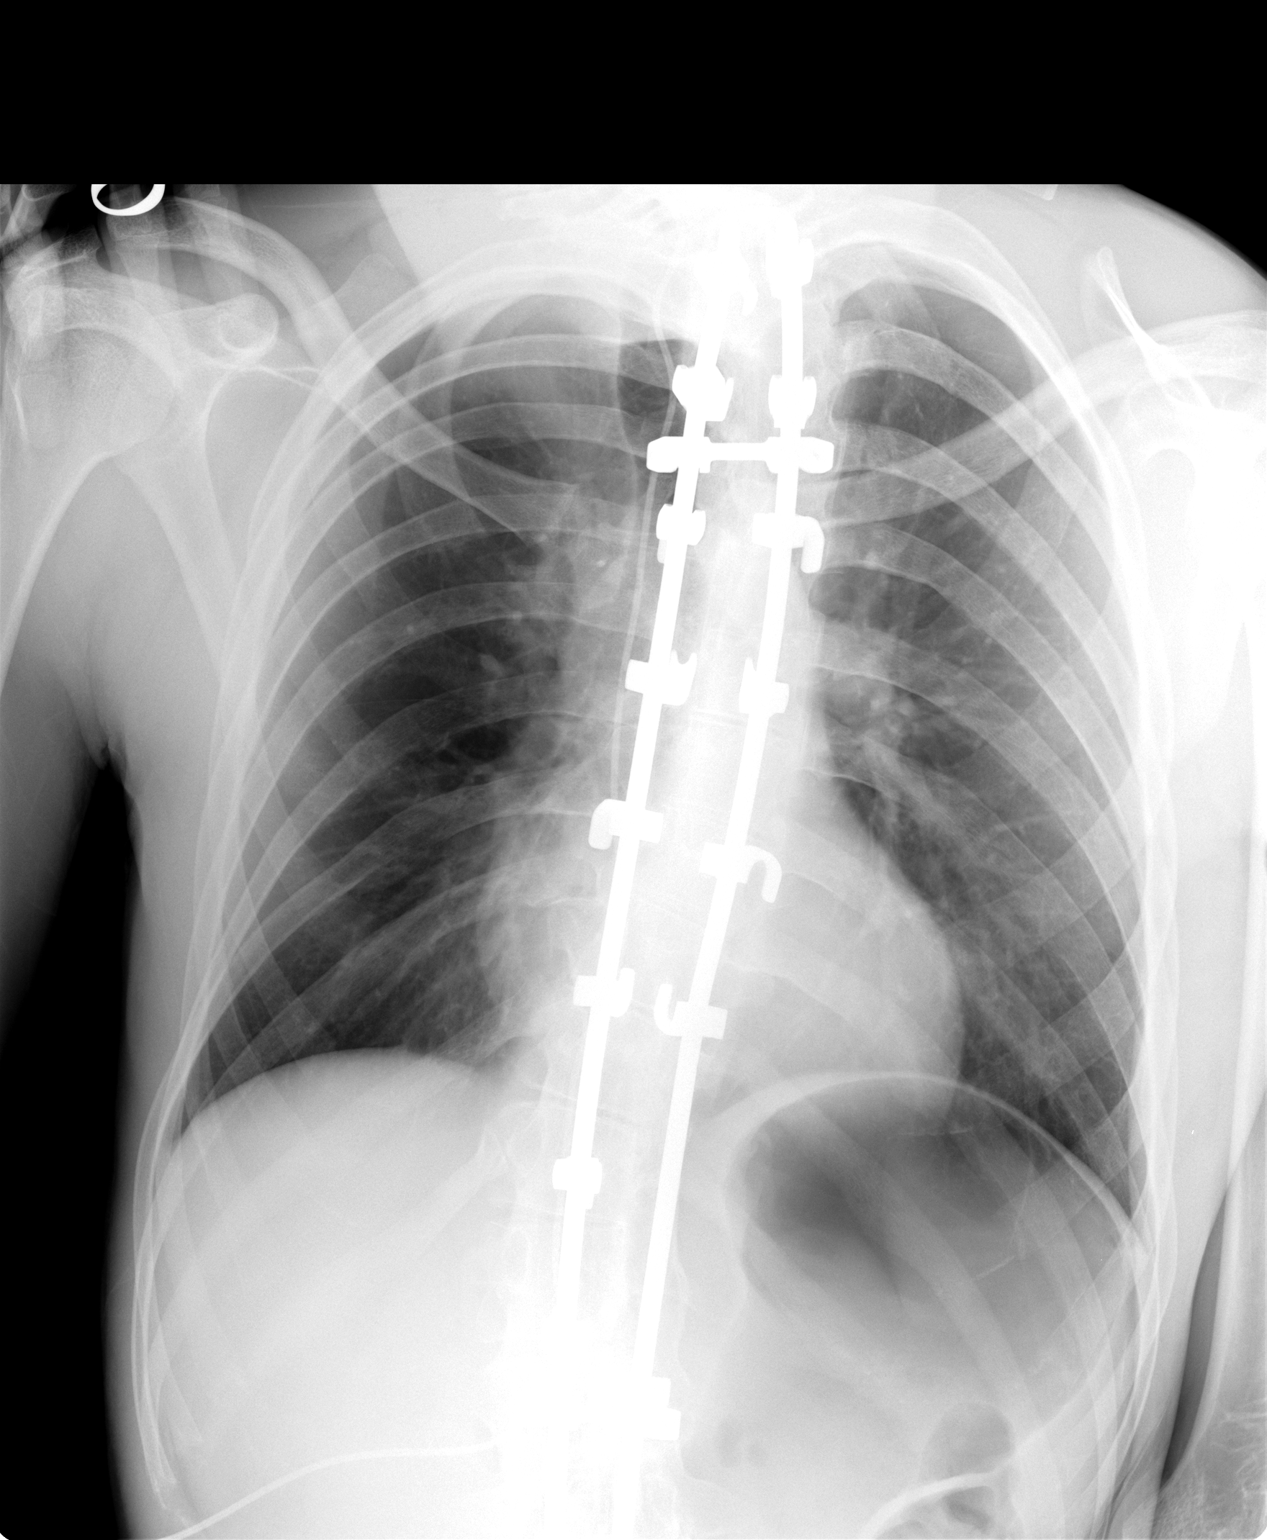

[1 of 1 positions shown; findings below may reference images not displayed]

IMPRESSION: No active lung disease.

## 2006-02-17 ENCOUNTER — Ambulatory Visit: Payer: Self-pay | Admitting: Family Medicine

## 2006-04-19 ENCOUNTER — Ambulatory Visit: Payer: Self-pay | Admitting: Family Medicine

## 2006-04-19 ENCOUNTER — Inpatient Hospital Stay (HOSPITAL_COMMUNITY): Admission: EM | Admit: 2006-04-19 | Discharge: 2006-04-24 | Payer: Self-pay | Admitting: Emergency Medicine

## 2006-04-22 ENCOUNTER — Encounter (INDEPENDENT_AMBULATORY_CARE_PROVIDER_SITE_OTHER): Payer: Self-pay | Admitting: Specialist

## 2006-04-22 ENCOUNTER — Encounter: Payer: Self-pay | Admitting: Internal Medicine

## 2006-04-23 ENCOUNTER — Encounter: Payer: Self-pay | Admitting: Internal Medicine

## 2006-04-24 ENCOUNTER — Ambulatory Visit: Payer: Self-pay | Admitting: Internal Medicine

## 2006-05-12 ENCOUNTER — Ambulatory Visit: Payer: Self-pay | Admitting: Family Medicine

## 2006-06-15 ENCOUNTER — Ambulatory Visit: Payer: Self-pay | Admitting: Family Medicine

## 2006-09-30 ENCOUNTER — Ambulatory Visit: Payer: Self-pay | Admitting: Family Medicine

## 2006-11-23 IMAGING — CR DG CHEST 1V PORT
1 series · 1 of 1 positions shown · non-contrast
Comparison: 03/02/05.

CLINICAL DATA: Chest pain. 
 PORTABLE CHEST ? 1 VIEW:

[view not recorded]
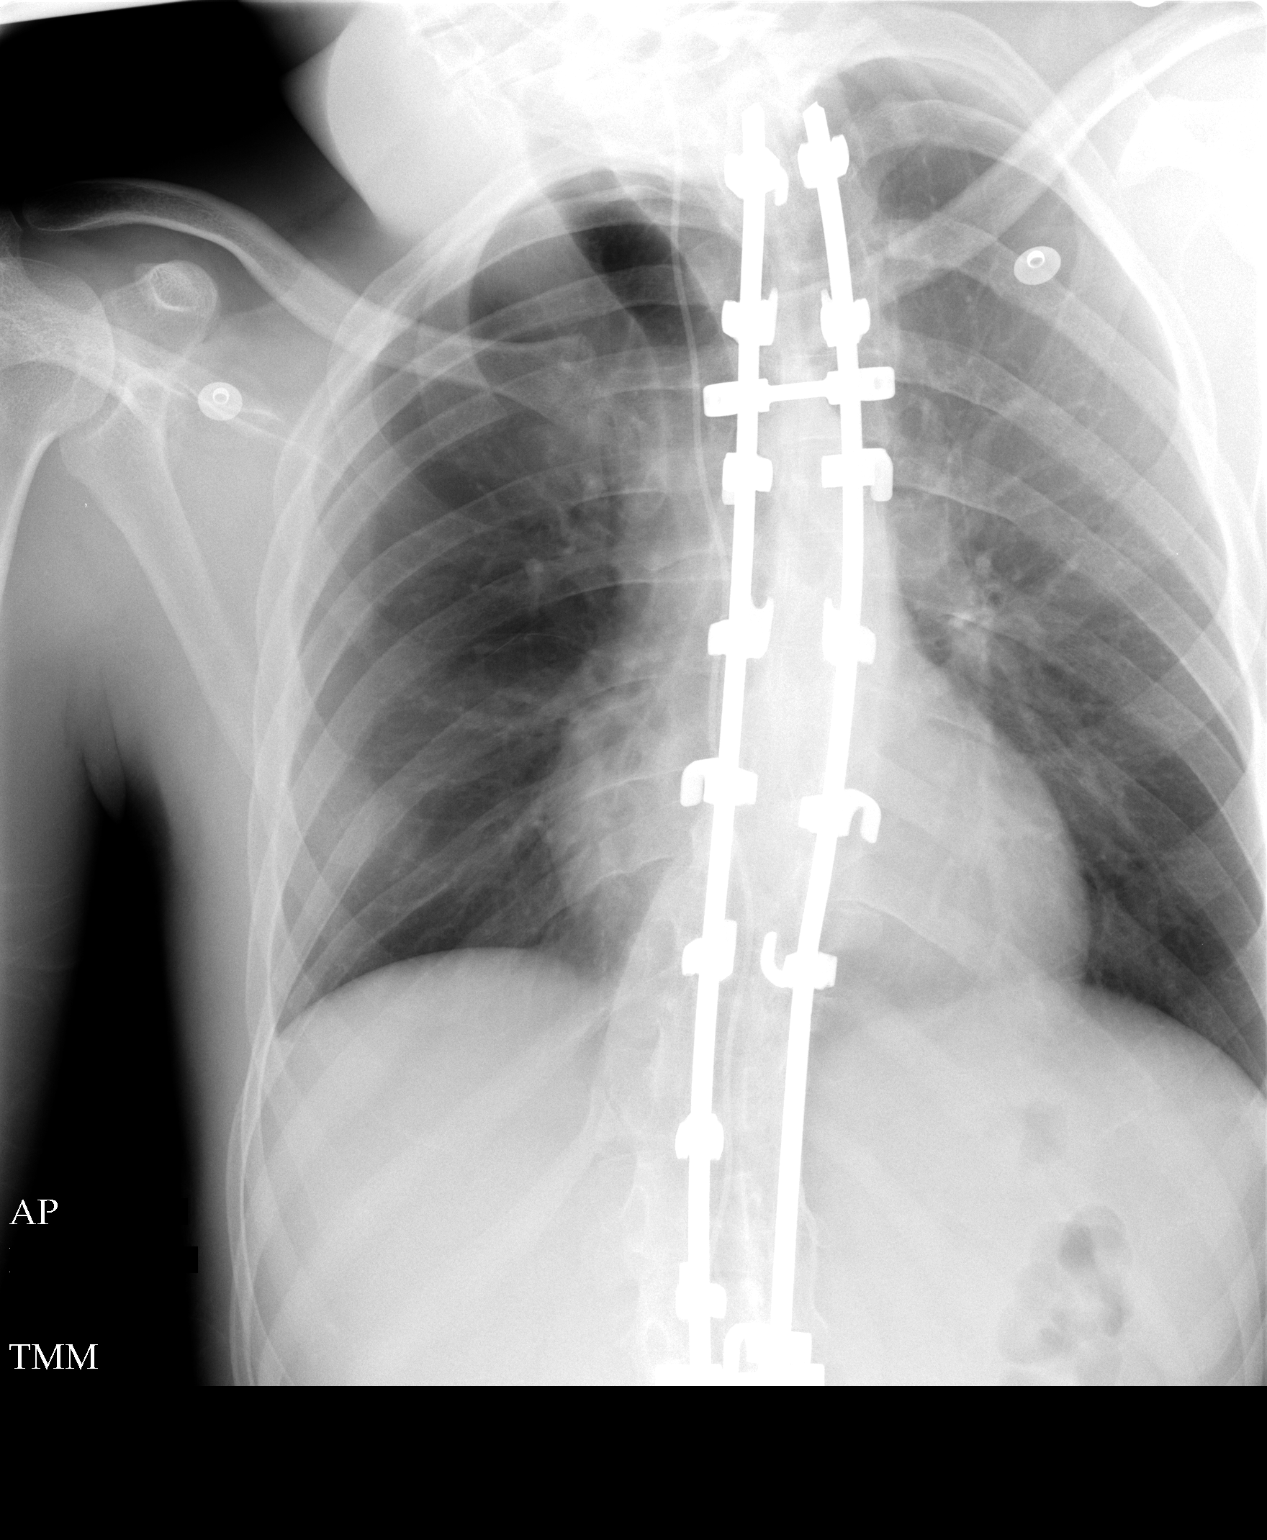

[1 of 1 positions shown; findings below may reference images not displayed]

FINDINGS: Heart size is stable. Lungs are clear.  Spinal rods are in place.
IMPRESSION: No acute findings.

## 2006-11-23 IMAGING — CT CT ANGIO CHEST
1 of 6 series · 11 of 30 positions shown · IV contrast (omnipaque)
Comparison: None.

CLINICAL DATA: Chest pain/short of breath.  Patient has cerebral palsy.  
 CT ANGIOGRAPHY OF CHEST:
TECHNIQUE: Multidetector CT imaging of the chest was performed during bolus injection of intravenous contrast.  Multiplanar CT angiographic image reconstructions were generated to evaluate the vascular anatomy.
 Contrast:  120 cc Omnipaque 300

[Series 4: pe · axial · 0.66mm/px · z∈[-268,-17]mm · 11 of 247 slices shown]
[im 23/247  lung]
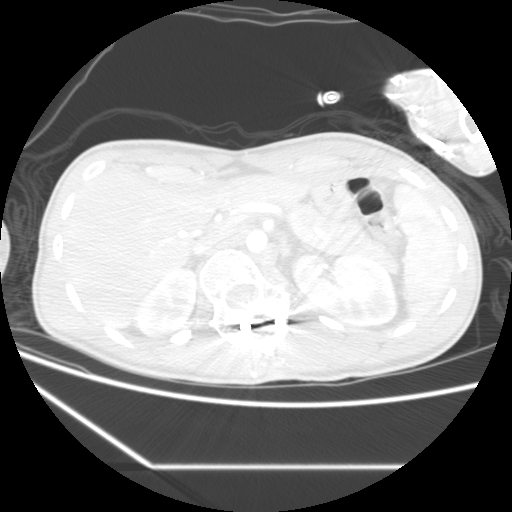
[im 45/247  mediastinal]
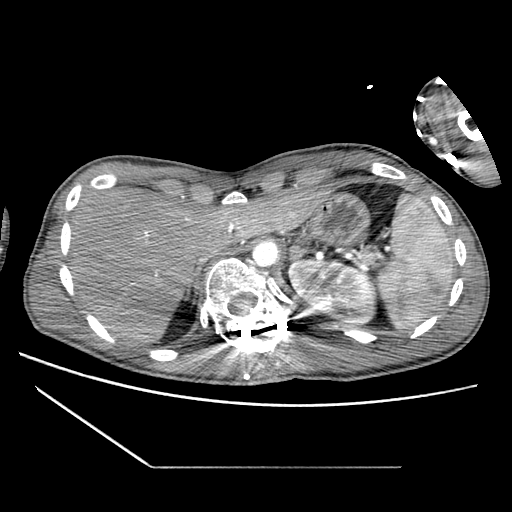
[im 68/247  lung]
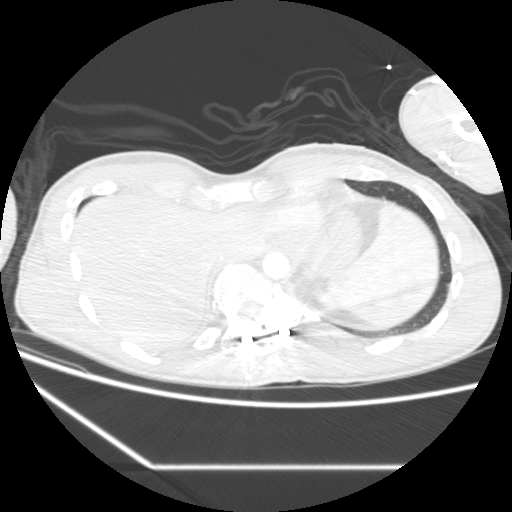
[im 90/247  mediastinal]
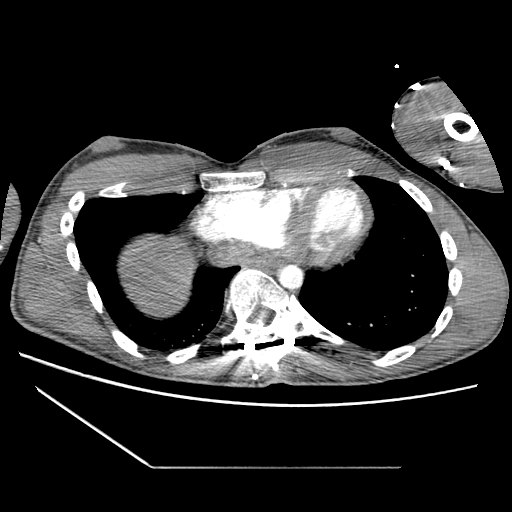
[im 112/247  lung]
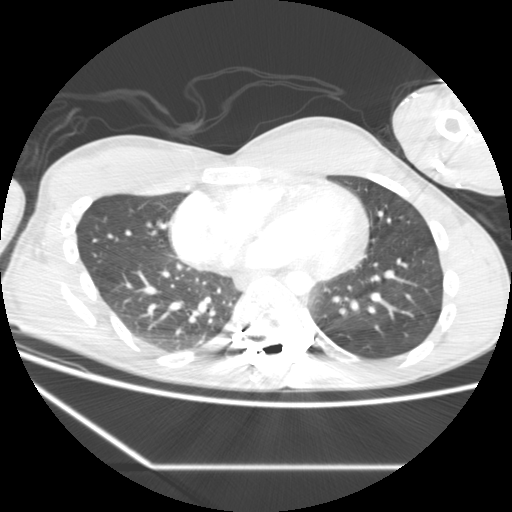
[im 116/247  mediastinal]
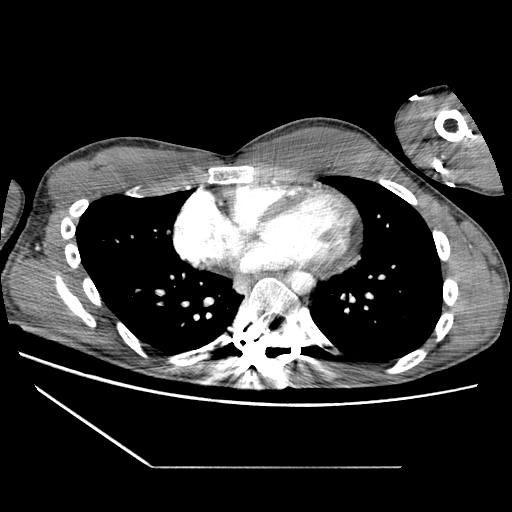
[im 135/247  lung]
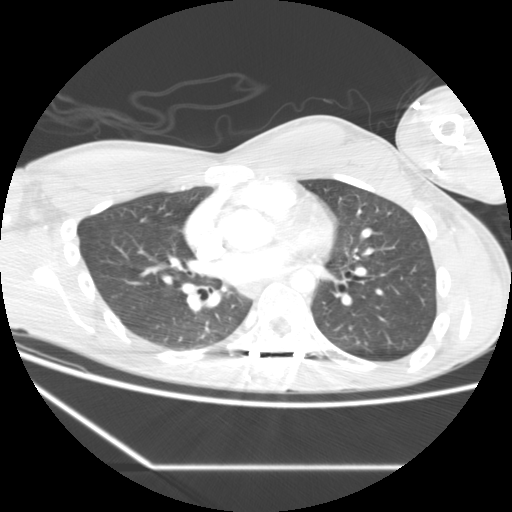
[im 157/247  mediastinal]
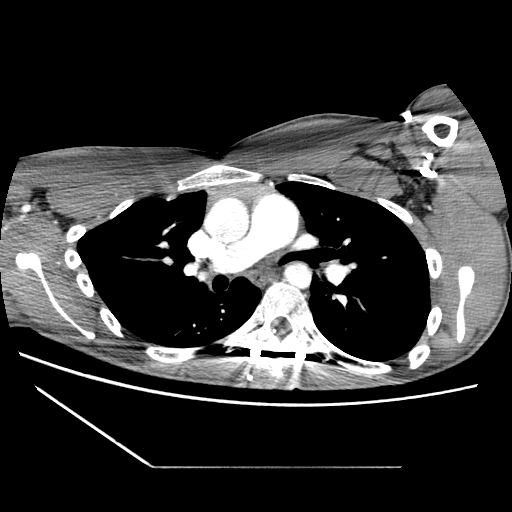
[im 179/247  lung]
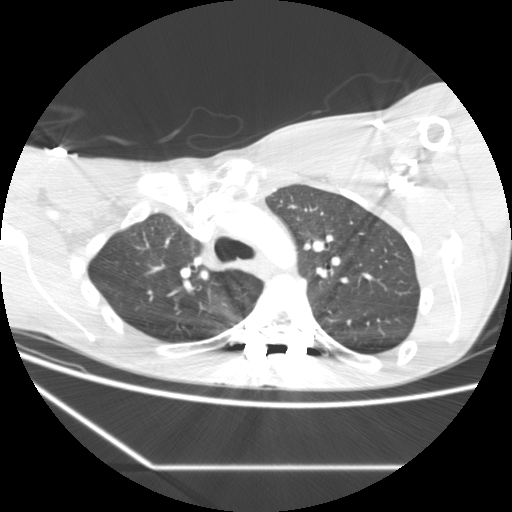
[im 202/247  mediastinal]
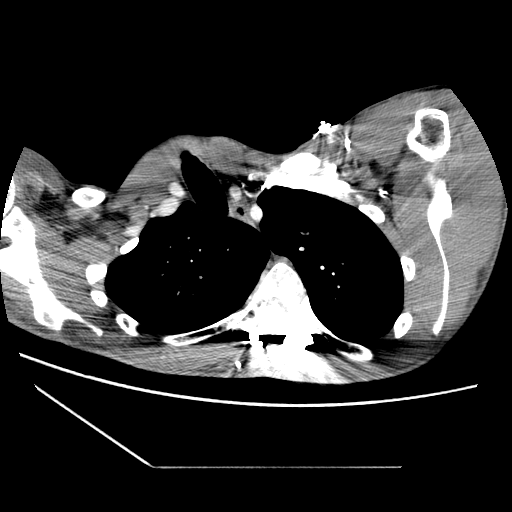
[im 224/247  lung]
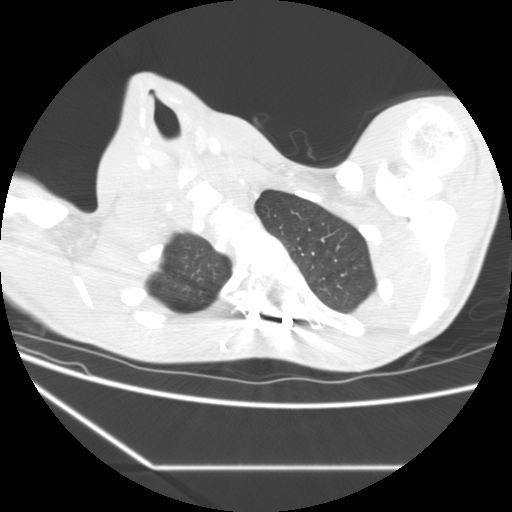

[11 of 30 positions shown; findings below may reference images not displayed]

FINDINGS: Patient has spinal deformities with Harrington rods running the entire length of his thoracic spine through the area scanned.  There is some beam hardening artifact emanating off the hardware.  This does not significantly degrade the image quality for pulmonary embolism evaluation although it does make visualization of the spine difficult.  
 There are no definite pulmonary emboli.  The lungs are clear.  No pleural or pericardial fluid.  No adenopathy.
IMPRESSION: 1.  No definite pulmonary emboli.  
 2.  Aorta normal.  
 3.  Lungs clear.  
 4.  Post Harrington rod fixation of the spine creates significant streak artifacts.  See report.

## 2006-12-14 ENCOUNTER — Ambulatory Visit: Payer: Self-pay | Admitting: Family Medicine

## 2007-01-29 ENCOUNTER — Emergency Department (HOSPITAL_COMMUNITY): Admission: EM | Admit: 2007-01-29 | Discharge: 2007-01-29 | Payer: Self-pay | Admitting: Emergency Medicine

## 2007-01-29 ENCOUNTER — Encounter (INDEPENDENT_AMBULATORY_CARE_PROVIDER_SITE_OTHER): Payer: Self-pay | Admitting: *Deleted

## 2007-02-02 ENCOUNTER — Ambulatory Visit: Payer: Self-pay | Admitting: Family Medicine

## 2007-02-08 ENCOUNTER — Encounter: Payer: Self-pay | Admitting: Family Medicine

## 2007-02-08 ENCOUNTER — Ambulatory Visit: Payer: Self-pay | Admitting: Family Medicine

## 2007-02-08 DIAGNOSIS — K2100 Gastro-esophageal reflux disease with esophagitis, without bleeding: Secondary | ICD-10-CM | POA: Insufficient documentation

## 2007-02-08 DIAGNOSIS — G808 Other cerebral palsy: Secondary | ICD-10-CM | POA: Insufficient documentation

## 2007-02-08 HISTORY — DX: Other cerebral palsy: G80.8

## 2007-02-16 ENCOUNTER — Ambulatory Visit: Payer: Self-pay | Admitting: Internal Medicine

## 2007-04-15 ENCOUNTER — Encounter: Payer: Self-pay | Admitting: Family Medicine

## 2007-04-29 ENCOUNTER — Ambulatory Visit: Payer: Self-pay | Admitting: Family Medicine

## 2007-04-29 DIAGNOSIS — R21 Rash and other nonspecific skin eruption: Secondary | ICD-10-CM | POA: Insufficient documentation

## 2007-05-02 LAB — CONVERTED CEMR LAB: Valproic Acid Lvl: 79.4 ug/mL

## 2007-05-04 LAB — CONVERTED CEMR LAB
BUN: 10 mg/dL (ref 6–23)
Basophils Absolute: 0 10*3/uL (ref 0.0–0.1)
Basophils Relative: 0.4 % (ref 0.0–1.0)
CO2: 30 meq/L (ref 19–32)
Calcium: 9.4 mg/dL (ref 8.4–10.5)
Chloride: 109 meq/L (ref 96–112)
Cholesterol: 152 mg/dL (ref 0–200)
Creatinine, Ser: 0.6 mg/dL (ref 0.4–1.5)
Eosinophils Absolute: 0.1 10*3/uL (ref 0.0–0.6)
Eosinophils Relative: 2.3 % (ref 0.0–5.0)
GFR calc Af Amer: 215 mL/min
GFR calc non Af Amer: 177 mL/min
Glucose, Bld: 83 mg/dL (ref 70–99)
HCT: 44.9 % (ref 39.0–52.0)
HDL: 26.1 mg/dL — ABNORMAL LOW (ref >39.0)
Hemoglobin: 15.1 g/dL (ref 13.0–17.0)
Hgb A1c MFr Bld: 5 % (ref 4.6–6.0)
LDL Cholesterol: 108 mg/dL — ABNORMAL HIGH (ref 0–99)
Lymphocytes Relative: 48.7 % — ABNORMAL HIGH (ref 12.0–46.0)
MCHC: 33.7 g/dL (ref 30.0–36.0)
MCV: 87.2 fL (ref 78.0–100.0)
Monocytes Absolute: 0.4 10*3/uL (ref 0.2–0.7)
Monocytes Relative: 6.1 % (ref 3.0–11.0)
Neutro Abs: 2.6 10*3/uL (ref 1.4–7.7)
Neutrophils Relative %: 42.5 % — ABNORMAL LOW (ref 43.0–77.0)
Platelets: 239 10*3/uL (ref 150–400)
Potassium: 3.9 meq/L (ref 3.5–5.1)
RBC: 5.15 M/uL (ref 4.22–5.81)
RDW: 12.4 % (ref 11.5–14.6)
Sodium: 143 meq/L (ref 135–145)
Total CHOL/HDL Ratio: 5.8
Triglycerides: 89 mg/dL (ref 0–149)
VLDL: 18 mg/dL (ref 0–40)
WBC: 6.2 10*3/uL (ref 4.5–10.5)

## 2007-05-10 ENCOUNTER — Encounter: Payer: Self-pay | Admitting: Family Medicine

## 2007-05-27 ENCOUNTER — Ambulatory Visit: Payer: Self-pay | Admitting: Family Medicine

## 2007-07-11 ENCOUNTER — Telehealth: Payer: Self-pay | Admitting: Family Medicine

## 2007-08-02 ENCOUNTER — Ambulatory Visit: Payer: Self-pay | Admitting: Family Medicine

## 2007-08-05 ENCOUNTER — Encounter: Payer: Self-pay | Admitting: Family Medicine

## 2007-08-09 ENCOUNTER — Encounter: Admission: RE | Admit: 2007-08-09 | Discharge: 2007-10-20 | Payer: Self-pay | Admitting: Family Medicine

## 2007-08-09 ENCOUNTER — Encounter: Payer: Self-pay | Admitting: Family Medicine

## 2007-08-15 ENCOUNTER — Encounter: Payer: Self-pay | Admitting: Family Medicine

## 2007-09-04 IMAGING — CR DG ABDOMEN 2V
1 series · 1 of 1 positions shown · non-contrast
Comparison: none

CLINICAL DATA: Cerebral palsy.  Possible GI bleeding.
 ABDOMEN ? 2 VIEW:
 Findings Supine and left side decubitus views show gas in the small and large bowel but no abnormally dilated loops or free air.  Gastrostomy is in place.  The patient has had spinal fusion.  An implantable pump is evident and appears unremarkable.

[view not recorded]
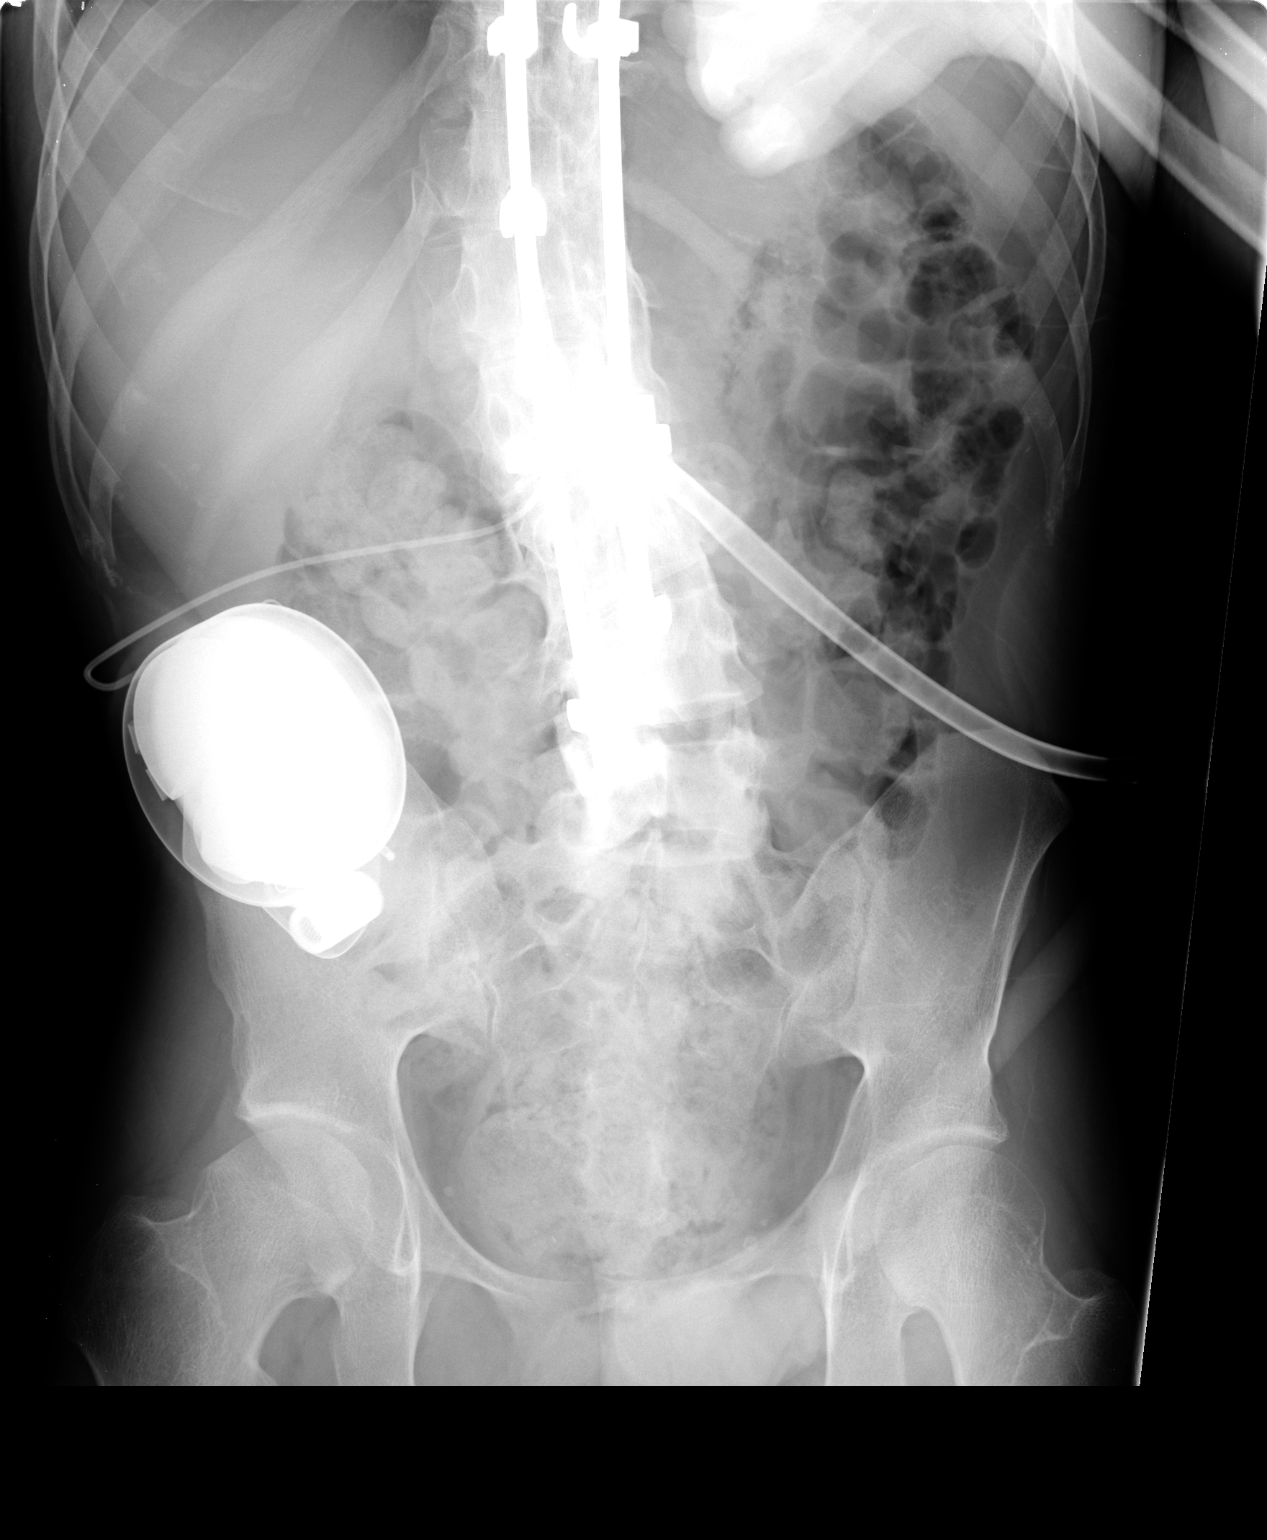

[1 of 1 positions shown; findings below may reference images not displayed]

IMPRESSION: No acute finding.

## 2007-09-12 ENCOUNTER — Encounter: Payer: Self-pay | Admitting: Family Medicine

## 2007-09-13 ENCOUNTER — Encounter: Payer: Self-pay | Admitting: Family Medicine

## 2007-09-16 ENCOUNTER — Encounter: Payer: Self-pay | Admitting: Family Medicine

## 2007-09-16 DIAGNOSIS — J984 Other disorders of lung: Secondary | ICD-10-CM | POA: Insufficient documentation

## 2007-09-16 DIAGNOSIS — M419 Scoliosis, unspecified: Secondary | ICD-10-CM

## 2007-09-27 ENCOUNTER — Ambulatory Visit: Payer: Self-pay | Admitting: Family Medicine

## 2007-10-11 ENCOUNTER — Ambulatory Visit: Payer: Self-pay | Admitting: Family Medicine

## 2007-10-12 ENCOUNTER — Telehealth (INDEPENDENT_AMBULATORY_CARE_PROVIDER_SITE_OTHER): Payer: Self-pay | Admitting: *Deleted

## 2007-10-13 ENCOUNTER — Encounter: Payer: Self-pay | Admitting: Family Medicine

## 2007-10-18 ENCOUNTER — Telehealth (INDEPENDENT_AMBULATORY_CARE_PROVIDER_SITE_OTHER): Payer: Self-pay | Admitting: *Deleted

## 2007-10-20 ENCOUNTER — Encounter: Payer: Self-pay | Admitting: Internal Medicine

## 2007-11-11 ENCOUNTER — Encounter: Payer: Self-pay | Admitting: Family Medicine

## 2007-11-18 ENCOUNTER — Encounter: Payer: Self-pay | Admitting: Family Medicine

## 2007-11-24 ENCOUNTER — Encounter: Admission: RE | Admit: 2007-11-24 | Discharge: 2007-11-24 | Payer: Self-pay | Admitting: Orthopedic Surgery

## 2008-01-17 ENCOUNTER — Encounter: Payer: Self-pay | Admitting: Family Medicine

## 2008-02-24 ENCOUNTER — Encounter: Payer: Self-pay | Admitting: Family Medicine

## 2008-03-28 ENCOUNTER — Encounter: Payer: Self-pay | Admitting: Internal Medicine

## 2008-04-02 ENCOUNTER — Encounter: Payer: Self-pay | Admitting: Internal Medicine

## 2008-04-02 DIAGNOSIS — R159 Full incontinence of feces: Secondary | ICD-10-CM | POA: Insufficient documentation

## 2008-04-02 DIAGNOSIS — R32 Unspecified urinary incontinence: Secondary | ICD-10-CM | POA: Insufficient documentation

## 2008-04-02 DIAGNOSIS — R1310 Dysphagia, unspecified: Secondary | ICD-10-CM | POA: Insufficient documentation

## 2008-04-03 ENCOUNTER — Encounter: Payer: Self-pay | Admitting: Internal Medicine

## 2008-04-24 ENCOUNTER — Encounter: Payer: Self-pay | Admitting: Family Medicine

## 2008-04-24 ENCOUNTER — Encounter: Admission: RE | Admit: 2008-04-24 | Discharge: 2008-07-23 | Payer: Self-pay | Admitting: Family Medicine

## 2008-05-21 ENCOUNTER — Ambulatory Visit: Payer: Self-pay | Admitting: Family Medicine

## 2008-05-22 ENCOUNTER — Telehealth (INDEPENDENT_AMBULATORY_CARE_PROVIDER_SITE_OTHER): Payer: Self-pay | Admitting: *Deleted

## 2008-05-31 ENCOUNTER — Encounter: Payer: Self-pay | Admitting: Family Medicine

## 2008-06-26 ENCOUNTER — Ambulatory Visit: Payer: Self-pay | Admitting: Family Medicine

## 2008-07-20 ENCOUNTER — Telehealth (INDEPENDENT_AMBULATORY_CARE_PROVIDER_SITE_OTHER): Payer: Self-pay | Admitting: *Deleted

## 2008-07-24 ENCOUNTER — Encounter: Payer: Self-pay | Admitting: Family Medicine

## 2008-07-26 ENCOUNTER — Encounter (INDEPENDENT_AMBULATORY_CARE_PROVIDER_SITE_OTHER): Payer: Self-pay | Admitting: *Deleted

## 2008-08-02 ENCOUNTER — Telehealth (INDEPENDENT_AMBULATORY_CARE_PROVIDER_SITE_OTHER): Payer: Self-pay | Admitting: *Deleted

## 2008-08-22 ENCOUNTER — Ambulatory Visit: Payer: Self-pay | Admitting: Family Medicine

## 2008-08-31 ENCOUNTER — Encounter: Payer: Self-pay | Admitting: Family Medicine

## 2008-10-01 ENCOUNTER — Encounter: Payer: Self-pay | Admitting: Family Medicine

## 2008-10-09 ENCOUNTER — Ambulatory Visit: Payer: Self-pay | Admitting: Family Medicine

## 2008-10-09 DIAGNOSIS — D239 Other benign neoplasm of skin, unspecified: Secondary | ICD-10-CM | POA: Insufficient documentation

## 2008-10-09 DIAGNOSIS — R109 Unspecified abdominal pain: Secondary | ICD-10-CM | POA: Insufficient documentation

## 2008-10-09 DIAGNOSIS — R300 Dysuria: Secondary | ICD-10-CM | POA: Insufficient documentation

## 2008-10-09 LAB — CONVERTED CEMR LAB
Bilirubin Urine: NEGATIVE
Blood in Urine, dipstick: NEGATIVE
Glucose, Urine, Semiquant: NEGATIVE
Ketones, urine, test strip: NEGATIVE
Nitrite: NEGATIVE
Protein, U semiquant: NEGATIVE
Specific Gravity, Urine: 1.025
Urobilinogen, UA: 0.2
WBC Urine, dipstick: NEGATIVE
pH: 5

## 2008-10-11 ENCOUNTER — Encounter (INDEPENDENT_AMBULATORY_CARE_PROVIDER_SITE_OTHER): Payer: Self-pay | Admitting: *Deleted

## 2008-10-11 LAB — CONVERTED CEMR LAB
ALT: 13 units/L (ref 0–53)
AST: 17 units/L (ref 0–37)
Albumin: 3.6 g/dL (ref 3.5–5.2)
Alkaline Phosphatase: 65 units/L (ref 39–117)
BUN: 8 mg/dL (ref 6–23)
Basophils Absolute: 0 10*3/uL (ref 0.0–0.1)
Basophils Relative: 0.4 % (ref 0.0–3.0)
Bilirubin, Direct: 0.1 mg/dL (ref 0.0–0.3)
CO2: 29 meq/L (ref 19–32)
Calcium: 9.1 mg/dL (ref 8.4–10.5)
Chloride: 103 meq/L (ref 96–112)
Creatinine, Ser: 0.6 mg/dL (ref 0.4–1.5)
Eosinophils Absolute: 0.1 10*3/uL (ref 0.0–0.7)
Eosinophils Relative: 0.9 % (ref 0.0–5.0)
GFR calc Af Amer: 213 mL/min
GFR calc non Af Amer: 176 mL/min
Glucose, Bld: 81 mg/dL (ref 70–99)
HCT: 41.6 % (ref 39.0–52.0)
Hemoglobin: 14.3 g/dL (ref 13.0–17.0)
Lymphocytes Relative: 41.9 % (ref 12.0–46.0)
MCHC: 34.3 g/dL (ref 30.0–36.0)
MCV: 89.5 fL (ref 78.0–100.0)
Monocytes Absolute: 0.2 10*3/uL (ref 0.1–1.0)
Monocytes Relative: 3.8 % (ref 3.0–12.0)
Neutro Abs: 3.5 10*3/uL (ref 1.4–7.7)
Neutrophils Relative %: 53 % (ref 43.0–77.0)
Platelets: 331 10*3/uL (ref 150–400)
Potassium: 3.6 meq/L (ref 3.5–5.1)
RBC: 4.65 M/uL (ref 4.22–5.81)
RDW: 11.6 % (ref 11.5–14.6)
Sodium: 139 meq/L (ref 135–145)
Total Bilirubin: 0.7 mg/dL (ref 0.3–1.2)
Total Protein: 7.8 g/dL (ref 6.0–8.3)
WBC: 6.5 10*3/uL (ref 4.5–10.5)

## 2008-11-06 ENCOUNTER — Telehealth: Payer: Self-pay | Admitting: Family Medicine

## 2008-11-06 ENCOUNTER — Ambulatory Visit: Payer: Self-pay | Admitting: Diagnostic Radiology

## 2008-11-06 ENCOUNTER — Ambulatory Visit (HOSPITAL_BASED_OUTPATIENT_CLINIC_OR_DEPARTMENT_OTHER): Admission: RE | Admit: 2008-11-06 | Discharge: 2008-11-06 | Payer: Self-pay | Admitting: Internal Medicine

## 2008-11-06 DIAGNOSIS — M25559 Pain in unspecified hip: Secondary | ICD-10-CM | POA: Insufficient documentation

## 2008-11-14 ENCOUNTER — Encounter: Payer: Self-pay | Admitting: Family Medicine

## 2008-12-27 ENCOUNTER — Encounter: Payer: Self-pay | Admitting: Family Medicine

## 2009-01-02 ENCOUNTER — Encounter: Payer: Self-pay | Admitting: Family Medicine

## 2009-01-28 ENCOUNTER — Encounter: Payer: Self-pay | Admitting: Family Medicine

## 2009-02-25 ENCOUNTER — Ambulatory Visit: Payer: Self-pay | Admitting: Family Medicine

## 2009-02-25 ENCOUNTER — Ambulatory Visit (HOSPITAL_BASED_OUTPATIENT_CLINIC_OR_DEPARTMENT_OTHER): Admission: RE | Admit: 2009-02-25 | Discharge: 2009-02-25 | Payer: Self-pay | Admitting: Family Medicine

## 2009-02-25 ENCOUNTER — Ambulatory Visit: Payer: Self-pay | Admitting: Diagnostic Radiology

## 2009-02-25 DIAGNOSIS — R05 Cough: Secondary | ICD-10-CM

## 2009-02-25 DIAGNOSIS — R002 Palpitations: Secondary | ICD-10-CM | POA: Insufficient documentation

## 2009-02-25 DIAGNOSIS — R059 Cough, unspecified: Secondary | ICD-10-CM | POA: Insufficient documentation

## 2009-02-25 DIAGNOSIS — F411 Generalized anxiety disorder: Secondary | ICD-10-CM | POA: Insufficient documentation

## 2009-02-26 ENCOUNTER — Telehealth (INDEPENDENT_AMBULATORY_CARE_PROVIDER_SITE_OTHER): Payer: Self-pay | Admitting: *Deleted

## 2009-03-05 ENCOUNTER — Encounter (INDEPENDENT_AMBULATORY_CARE_PROVIDER_SITE_OTHER): Payer: Self-pay | Admitting: *Deleted

## 2009-03-05 ENCOUNTER — Ambulatory Visit (HOSPITAL_COMMUNITY): Admission: RE | Admit: 2009-03-05 | Discharge: 2009-03-05 | Payer: Self-pay | Admitting: Family Medicine

## 2009-03-05 ENCOUNTER — Encounter: Payer: Self-pay | Admitting: Family Medicine

## 2009-03-07 ENCOUNTER — Encounter: Payer: Self-pay | Admitting: Family Medicine

## 2009-03-20 ENCOUNTER — Encounter: Payer: Self-pay | Admitting: Family Medicine

## 2009-03-21 ENCOUNTER — Encounter: Payer: Self-pay | Admitting: Family Medicine

## 2009-05-21 ENCOUNTER — Encounter: Payer: Self-pay | Admitting: Family Medicine

## 2009-06-12 IMAGING — CR DG HIP W/ PELVIS BILAT
5 series · 5 of 5 positions shown · non-contrast
Comparison: Abdomen radiograph 01/29/2007

CLINICAL DATA: Bilateral hip pain, prior left hip dislocation.
Cerebral palsy.

BILATERAL HIP WITH PELVIS - 4+ VIEW

[t pelvis a.p.]
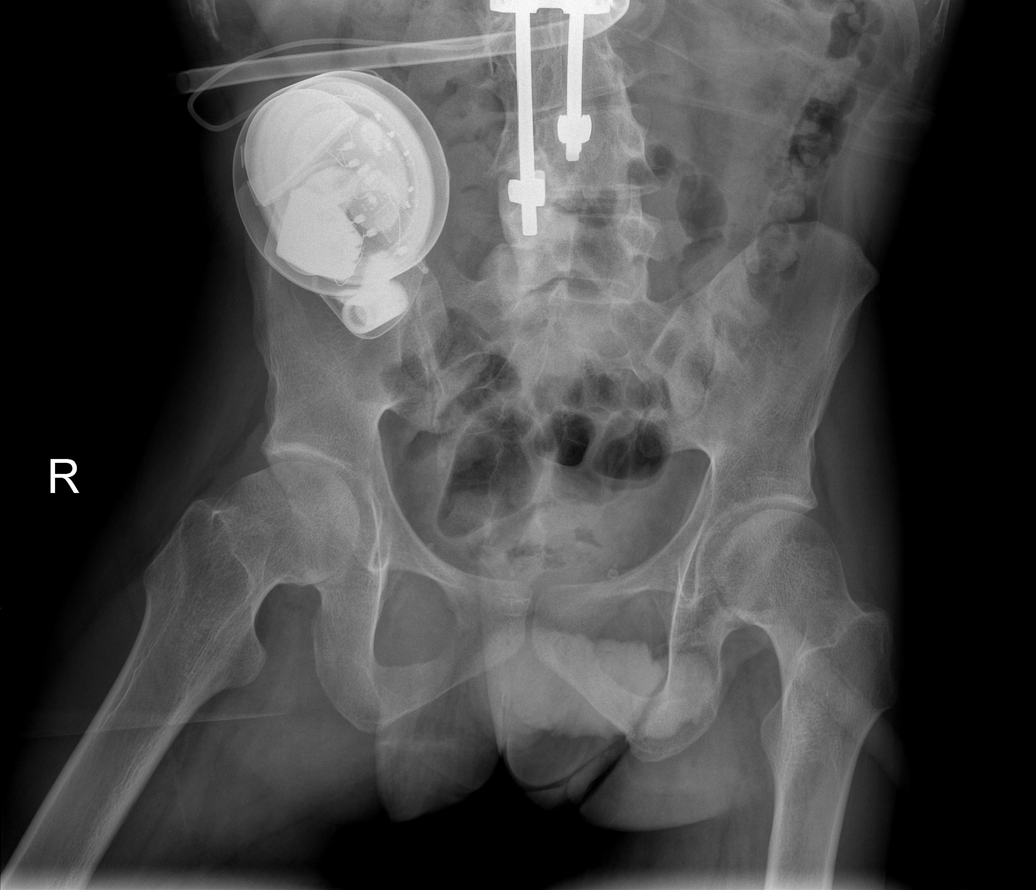

[t hip ap left]
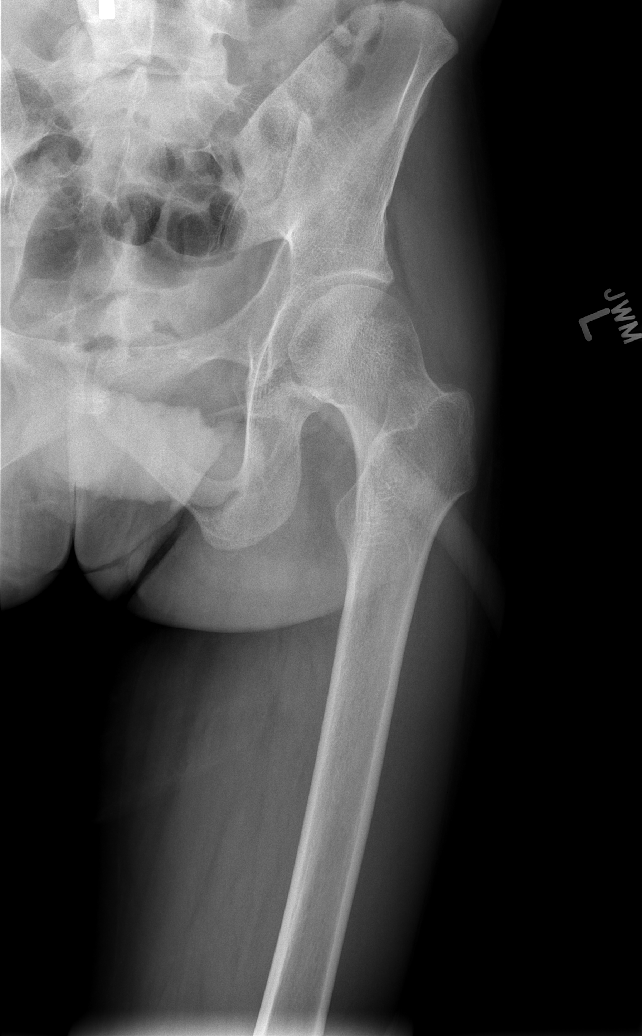

[t hip ap right]
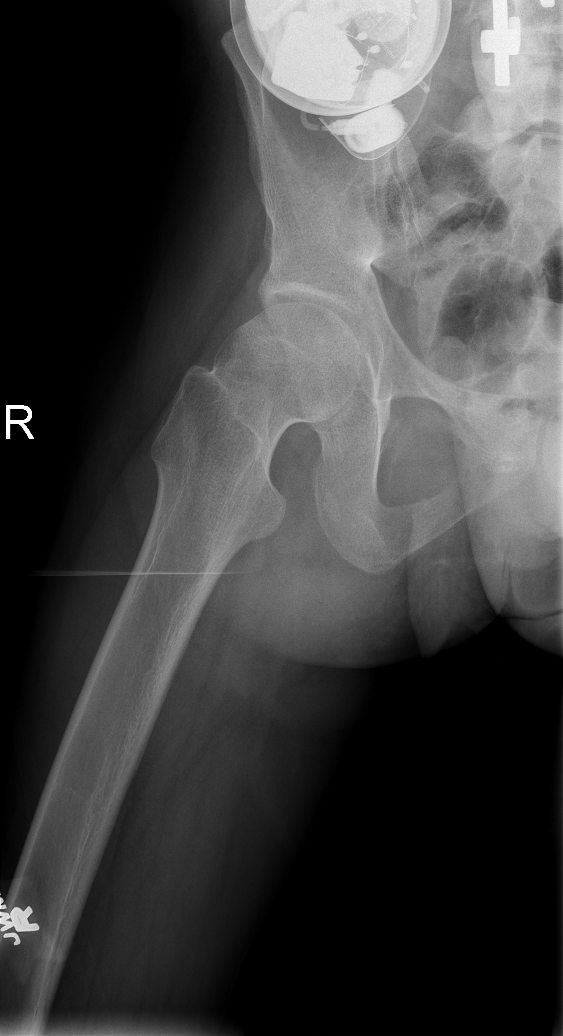

[t hip frog leg right]
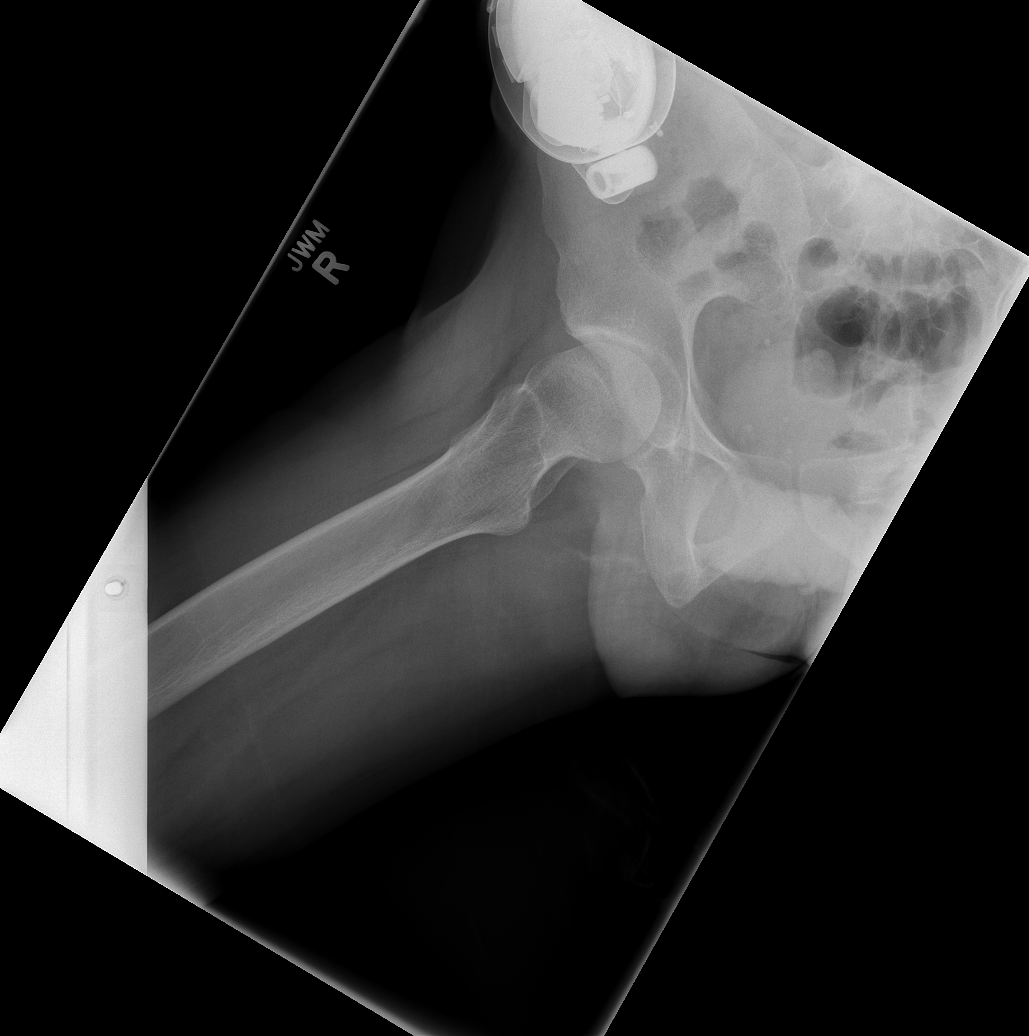

[t hip frog leg left]
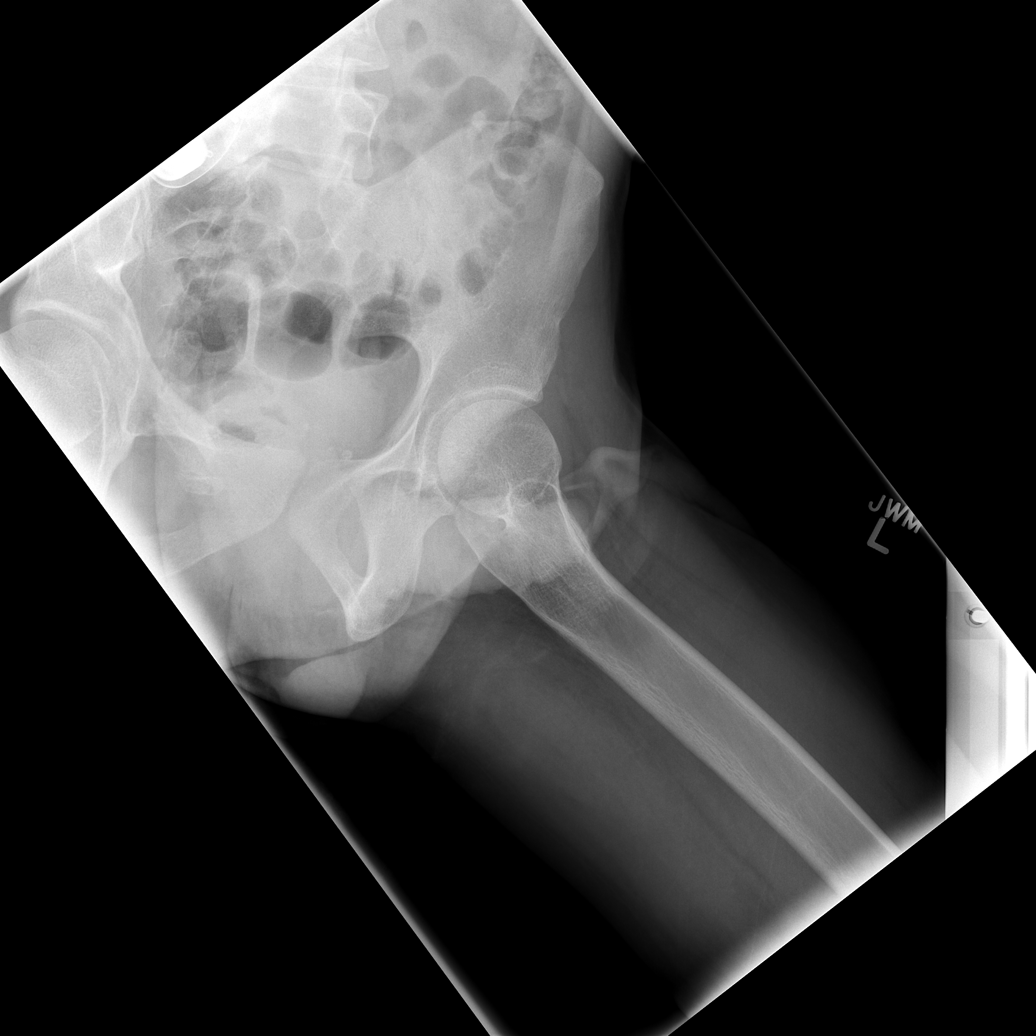

[5 of 5 positions shown; findings below may reference images not displayed]

FINDINGS: Lumbar fusion rods partly visualized.  Electronic
infusion device projects over the right lower quadrant.  No
visualized displaced pelvic fracture is identified.  Normal
visualized bowel gas pattern with partial obscuration of the
sacrum.  No femoral fracture or dislocation is seen.
IMPRESSION: No acute hip fracture or dislocation.

## 2009-07-04 ENCOUNTER — Emergency Department (HOSPITAL_BASED_OUTPATIENT_CLINIC_OR_DEPARTMENT_OTHER): Admission: EM | Admit: 2009-07-04 | Discharge: 2009-07-04 | Payer: Self-pay | Admitting: Emergency Medicine

## 2009-07-04 ENCOUNTER — Telehealth: Payer: Self-pay | Admitting: Family Medicine

## 2009-07-04 ENCOUNTER — Ambulatory Visit: Payer: Self-pay | Admitting: Diagnostic Radiology

## 2009-07-12 ENCOUNTER — Encounter: Payer: Self-pay | Admitting: Family Medicine

## 2009-07-18 ENCOUNTER — Encounter: Payer: Self-pay | Admitting: Family Medicine

## 2009-07-25 ENCOUNTER — Encounter: Payer: Self-pay | Admitting: Family Medicine

## 2009-08-15 ENCOUNTER — Ambulatory Visit: Payer: Self-pay | Admitting: Family Medicine

## 2009-09-16 ENCOUNTER — Encounter: Payer: Self-pay | Admitting: Family Medicine

## 2009-09-30 ENCOUNTER — Encounter: Payer: Self-pay | Admitting: Family Medicine

## 2009-10-01 IMAGING — CR DG CHEST 1V
1 series · 1 of 1 positions shown · non-contrast
Comparison: CT chest 04/19/2006 and chest x-ray 04/19/2006

CLINICAL DATA: Chest pain and cough.  Evaluate for aspiration.
Chocking when eating.

CHEST - 1 VIEW

[view not recorded]
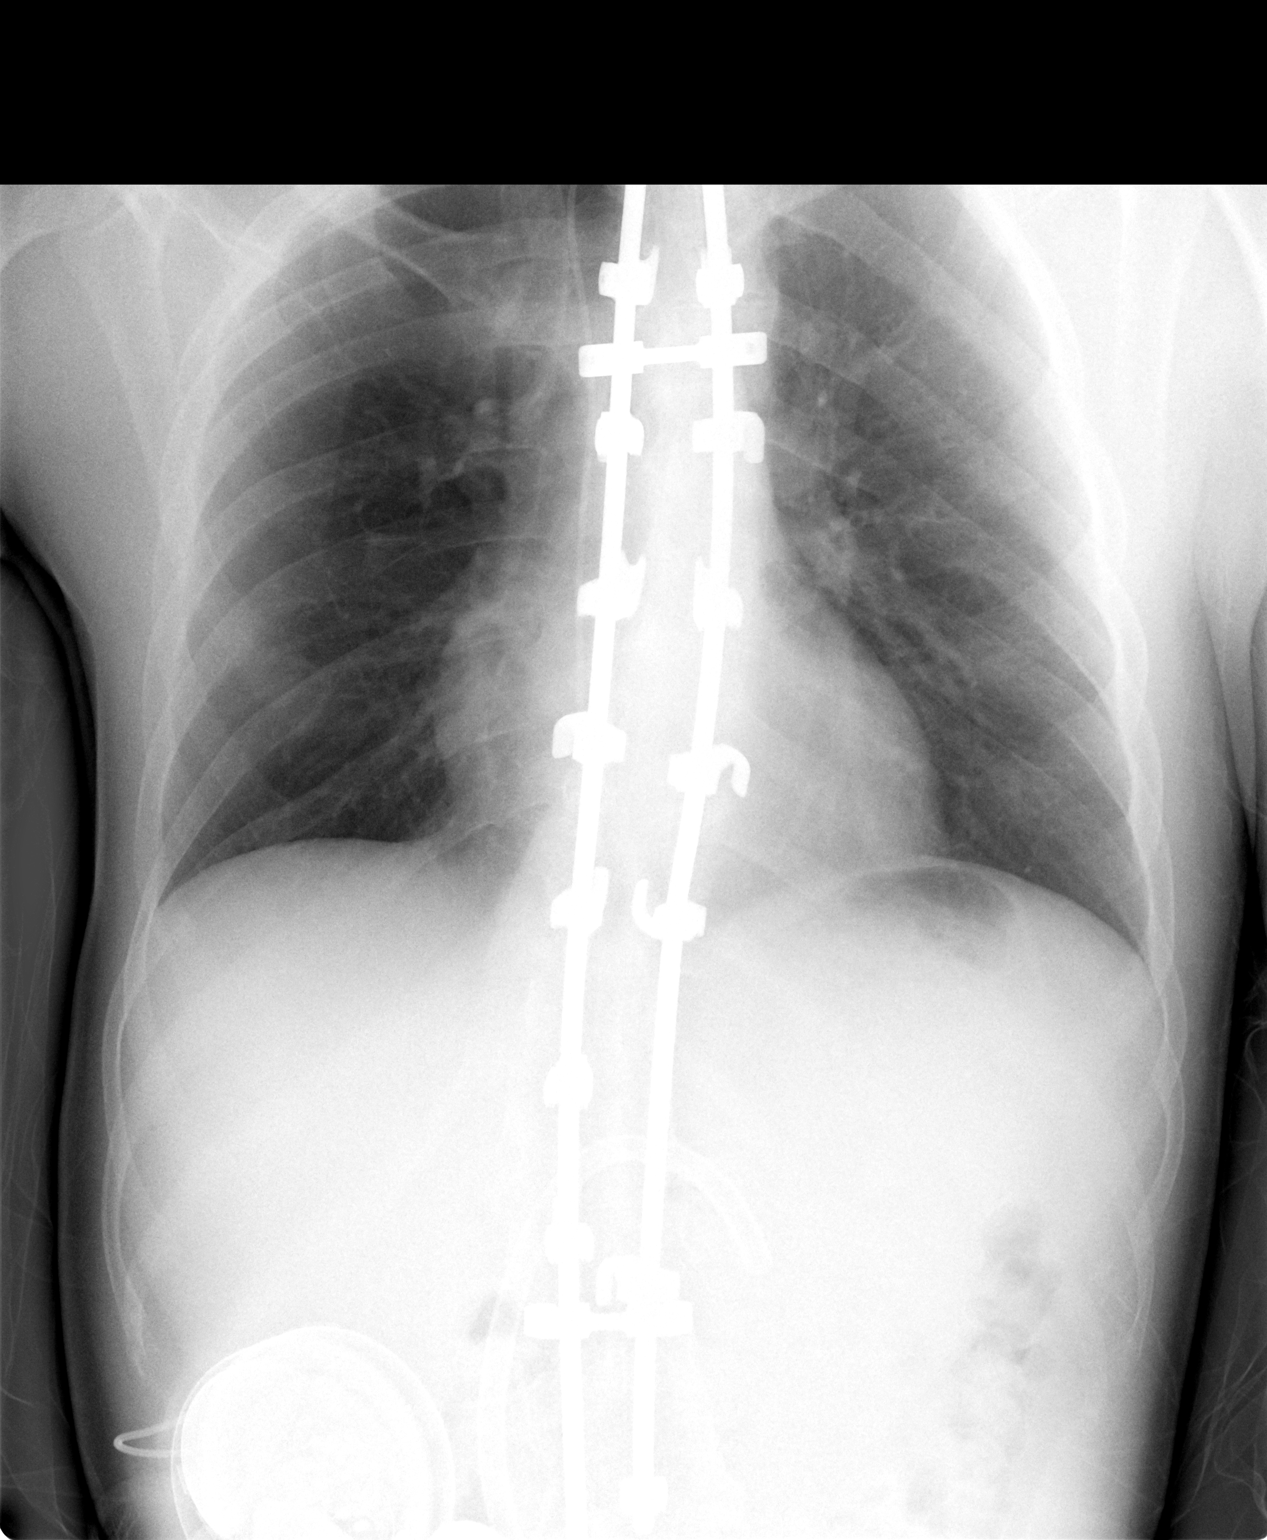

[1 of 1 positions shown; findings below may reference images not displayed]

FINDINGS: The patient is slightly rotated.  Heart size normal.
Lungs are clear.  Harrington rods span the length of the
thoracolumbar spine.
IMPRESSION: No acute findings.

## 2009-10-16 ENCOUNTER — Ambulatory Visit: Payer: Self-pay | Admitting: Family Medicine

## 2009-10-16 DIAGNOSIS — L708 Other acne: Secondary | ICD-10-CM | POA: Insufficient documentation

## 2009-10-23 ENCOUNTER — Telehealth (INDEPENDENT_AMBULATORY_CARE_PROVIDER_SITE_OTHER): Payer: Self-pay | Admitting: *Deleted

## 2009-10-23 ENCOUNTER — Encounter: Payer: Self-pay | Admitting: Family Medicine

## 2009-10-29 ENCOUNTER — Ambulatory Visit: Payer: Self-pay | Admitting: Diagnostic Radiology

## 2009-10-29 ENCOUNTER — Ambulatory Visit: Payer: Self-pay | Admitting: Internal Medicine

## 2009-10-29 ENCOUNTER — Ambulatory Visit (HOSPITAL_BASED_OUTPATIENT_CLINIC_OR_DEPARTMENT_OTHER): Admission: RE | Admit: 2009-10-29 | Discharge: 2009-10-29 | Payer: Self-pay | Admitting: Internal Medicine

## 2009-10-29 LAB — CONVERTED CEMR LAB
Bilirubin Urine: NEGATIVE
Blood in Urine, dipstick: NEGATIVE
Glucose, Urine, Semiquant: NEGATIVE
Ketones, urine, test strip: NEGATIVE
Nitrite: NEGATIVE
Protein, U semiquant: >=300
Specific Gravity, Urine: <1.005
Urobilinogen, UA: 0.2
WBC Urine, dipstick: NEGATIVE
pH: 8.5

## 2009-11-06 ENCOUNTER — Telehealth: Payer: Self-pay | Admitting: Internal Medicine

## 2009-11-06 ENCOUNTER — Ambulatory Visit: Payer: Self-pay | Admitting: Internal Medicine

## 2009-11-06 DIAGNOSIS — R609 Edema, unspecified: Secondary | ICD-10-CM | POA: Insufficient documentation

## 2009-11-07 ENCOUNTER — Telehealth (INDEPENDENT_AMBULATORY_CARE_PROVIDER_SITE_OTHER): Payer: Self-pay | Admitting: *Deleted

## 2009-12-30 ENCOUNTER — Encounter: Payer: Self-pay | Admitting: Family Medicine

## 2010-02-06 ENCOUNTER — Ambulatory Visit (HOSPITAL_BASED_OUTPATIENT_CLINIC_OR_DEPARTMENT_OTHER): Admission: RE | Admit: 2010-02-06 | Discharge: 2010-02-06 | Payer: Self-pay | Admitting: Family Medicine

## 2010-02-06 ENCOUNTER — Ambulatory Visit: Payer: Self-pay | Admitting: Family Medicine

## 2010-02-06 ENCOUNTER — Ambulatory Visit: Payer: Self-pay | Admitting: Interventional Radiology

## 2010-02-06 DIAGNOSIS — R1031 Right lower quadrant pain: Secondary | ICD-10-CM | POA: Insufficient documentation

## 2010-02-07 IMAGING — CR DG CHEST 2V
1 series · 1 of 1 positions shown · non-contrast
Comparison: 02/25/2009

CLINICAL DATA: Chest pain, cerebral palsy

CHEST - 2 VIEW

[w chest pa]
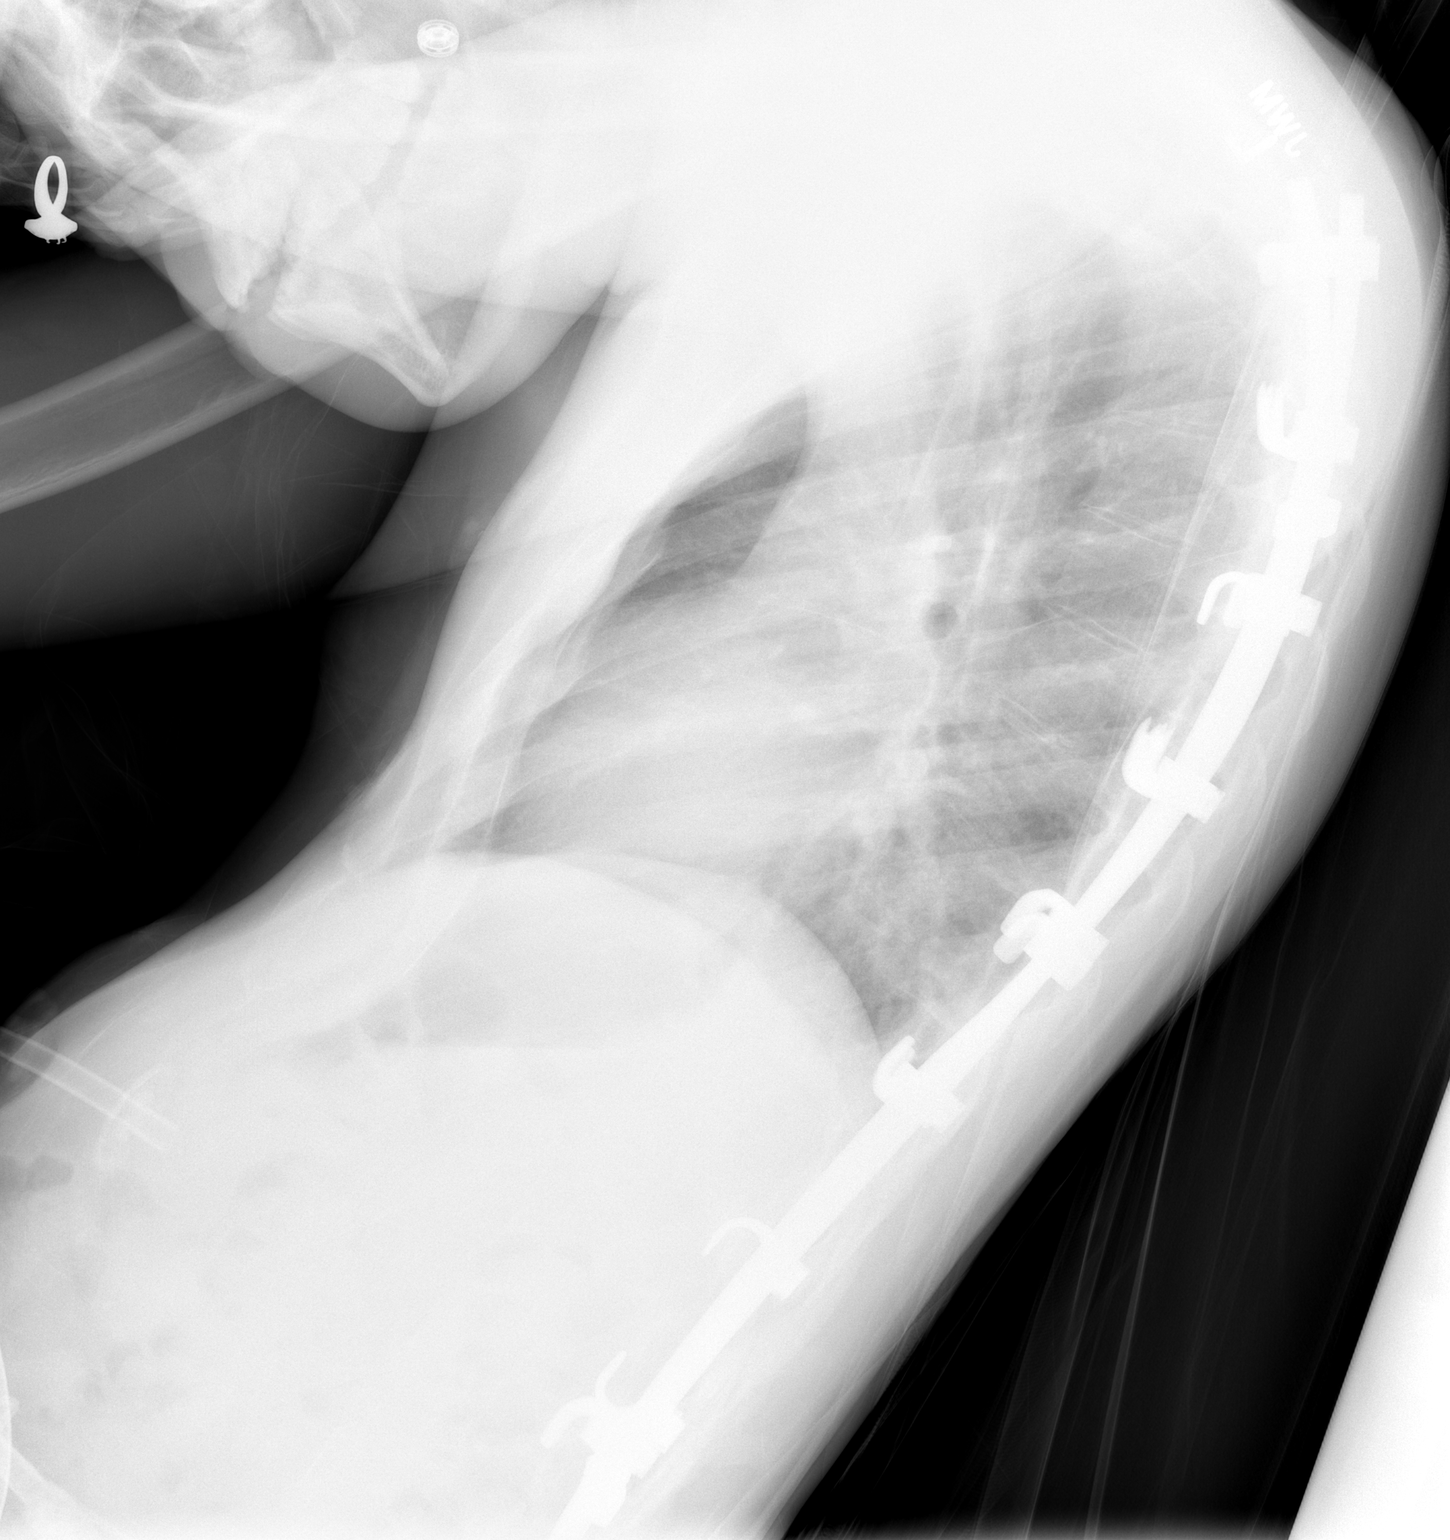

[1 of 1 positions shown; findings below may reference images not displayed]

FINDINGS: Scoliosis status post placement of bilateral spinal fixation rods.
Normal heart size, mediastinal contours and pulmonary vascularity
for degree of scoliosis.
No gross pulmonary infiltrate or pleural effusion.
Bones appear demineralized.
IMPRESSION: Scoliosis.
No acute abnormalities.

## 2010-02-13 ENCOUNTER — Encounter: Payer: Self-pay | Admitting: Family Medicine

## 2010-02-17 ENCOUNTER — Encounter: Payer: Self-pay | Admitting: Family Medicine

## 2010-03-06 ENCOUNTER — Encounter: Payer: Self-pay | Admitting: Family Medicine

## 2010-04-25 ENCOUNTER — Ambulatory Visit: Payer: Self-pay | Admitting: Family Medicine

## 2010-04-28 LAB — CONVERTED CEMR LAB
ALT: 15 units/L (ref 0–53)
AST: 23 units/L (ref 0–37)
Albumin: 4 g/dL (ref 3.5–5.2)
Alkaline Phosphatase: 55 units/L (ref 39–117)
BUN: 12 mg/dL (ref 6–23)
Basophils Absolute: 0 10*3/uL (ref 0.0–0.1)
Basophils Relative: 0.2 % (ref 0.0–3.0)
Bilirubin, Direct: 0.1 mg/dL (ref 0.0–0.3)
CO2: 30 meq/L (ref 19–32)
Calcium: 9.4 mg/dL (ref 8.4–10.5)
Chloride: 107 meq/L (ref 96–112)
Cholesterol: 151 mg/dL (ref 0–200)
Creatinine, Ser: 0.7 mg/dL (ref 0.4–1.5)
Eosinophils Absolute: 0.1 10*3/uL (ref 0.0–0.7)
Eosinophils Relative: 1.3 % (ref 0.0–5.0)
GFR calc non Af Amer: 157.69 mL/min (ref >60)
Glucose, Bld: 82 mg/dL (ref 70–99)
HCT: 44 % (ref 39.0–52.0)
HDL: 29.9 mg/dL — ABNORMAL LOW (ref >39.00)
Hemoglobin: 15.3 g/dL (ref 13.0–17.0)
LDL Cholesterol: 96 mg/dL (ref 0–99)
Lymphocytes Relative: 41.1 % (ref 12.0–46.0)
Lymphs Abs: 2.4 10*3/uL (ref 0.7–4.0)
MCHC: 34.7 g/dL (ref 30.0–36.0)
MCV: 90.2 fL (ref 78.0–100.0)
Monocytes Absolute: 0.3 10*3/uL (ref 0.1–1.0)
Monocytes Relative: 5.1 % (ref 3.0–12.0)
Neutro Abs: 3.1 10*3/uL (ref 1.4–7.7)
Neutrophils Relative %: 52.3 % (ref 43.0–77.0)
Platelets: 217 10*3/uL (ref 150.0–400.0)
Potassium: 4.4 meq/L (ref 3.5–5.1)
RBC: 4.88 M/uL (ref 4.22–5.81)
RDW: 12.2 % (ref 11.5–14.6)
Sodium: 143 meq/L (ref 135–145)
TSH: 1.99 microintl units/mL (ref 0.35–5.50)
Total Bilirubin: 0.6 mg/dL (ref 0.3–1.2)
Total CHOL/HDL Ratio: 5
Total Protein: 7.3 g/dL (ref 6.0–8.3)
Triglycerides: 125 mg/dL (ref 0.0–149.0)
VLDL: 25 mg/dL (ref 0.0–40.0)
WBC: 5.9 10*3/uL (ref 4.5–10.5)

## 2010-04-29 LAB — CONVERTED CEMR LAB: Valproic Acid Lvl: 53.2 ug/mL (ref 50.0–100.0)

## 2010-05-09 ENCOUNTER — Encounter: Payer: Self-pay | Admitting: Family Medicine

## 2010-05-19 ENCOUNTER — Telehealth (INDEPENDENT_AMBULATORY_CARE_PROVIDER_SITE_OTHER): Payer: Self-pay | Admitting: *Deleted

## 2010-06-04 IMAGING — CR DG CHEST 2V
2 series · 2 of 2 positions shown · non-contrast
Comparison: 07/04/2009

CLINICAL DATA: Fever

CHEST - 2 VIEW

[view not recorded (1 of 2)]
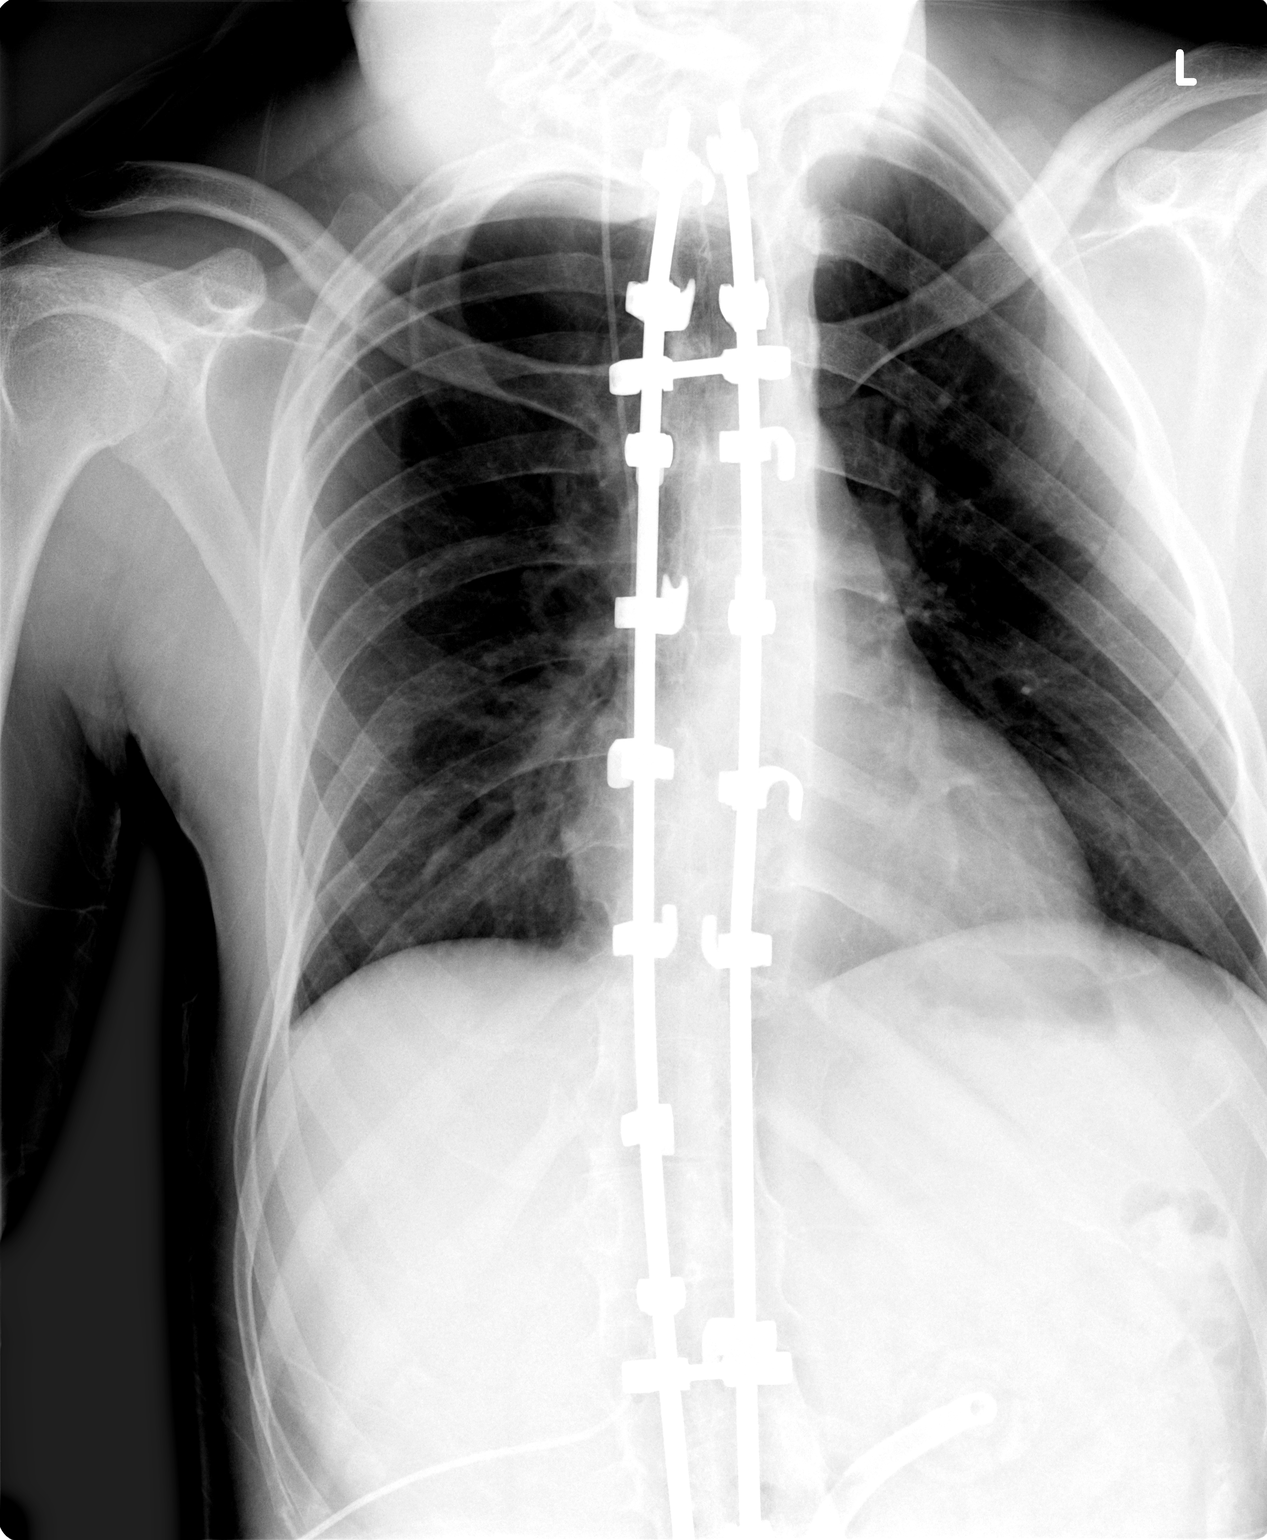

[view not recorded (2 of 2)]
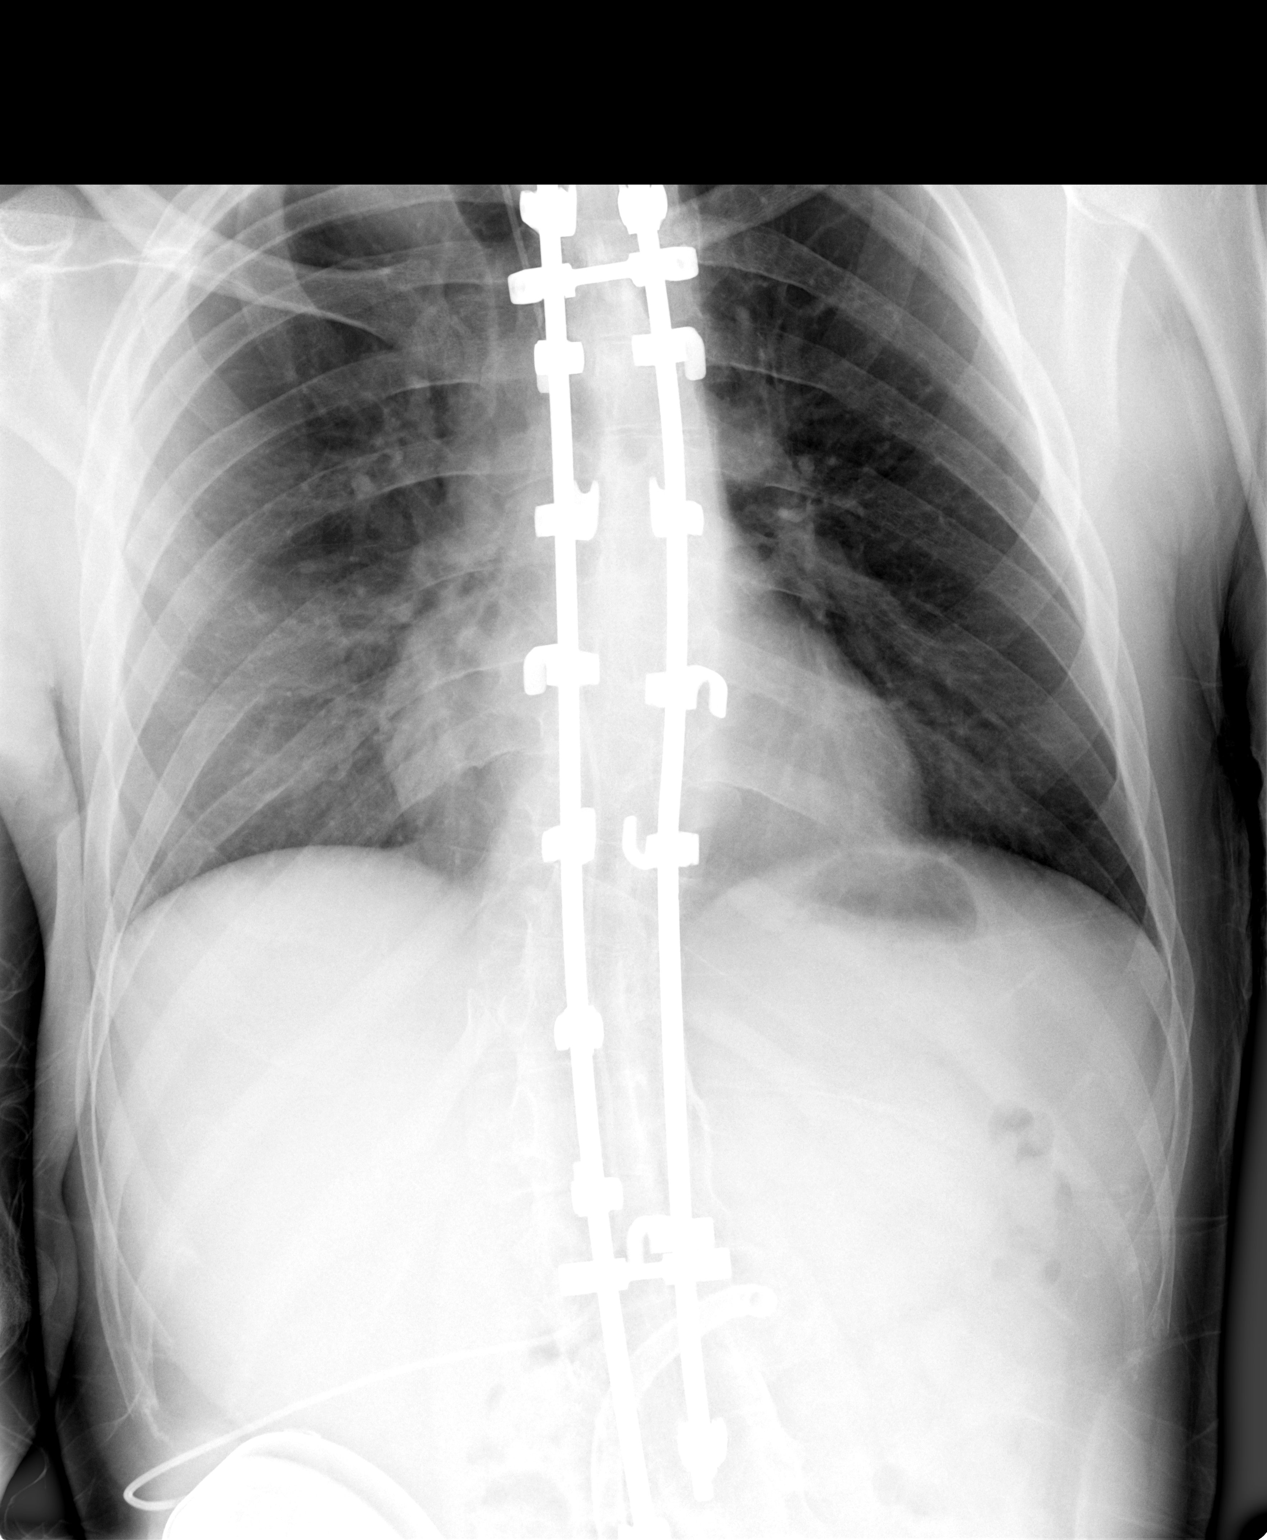

[2 of 2 positions shown; findings below may reference images not displayed]

FINDINGS: Scoliosis rods and gastrostomy tube are identified.

The heart size is normal.

No pleural effusion or pulmonary edema.

No airspace consolidation.

Review of the visualized osseous structures is unremarkable.
IMPRESSION: 1.  No active cardiopulmonary disease.

## 2010-06-12 IMAGING — CR DG ANKLE COMPLETE 3+V*L*
3 series · 3 of 3 positions shown · non-contrast
Comparison: None.

CLINICAL DATA: Left ankle pain and swelling after hitting ankle on
dresser.

LEFT ANKLE COMPLETE - 3+ VIEW,LEFT FOOT - COMPLETE 3+ VIEW

[view not recorded (1 of 3)]
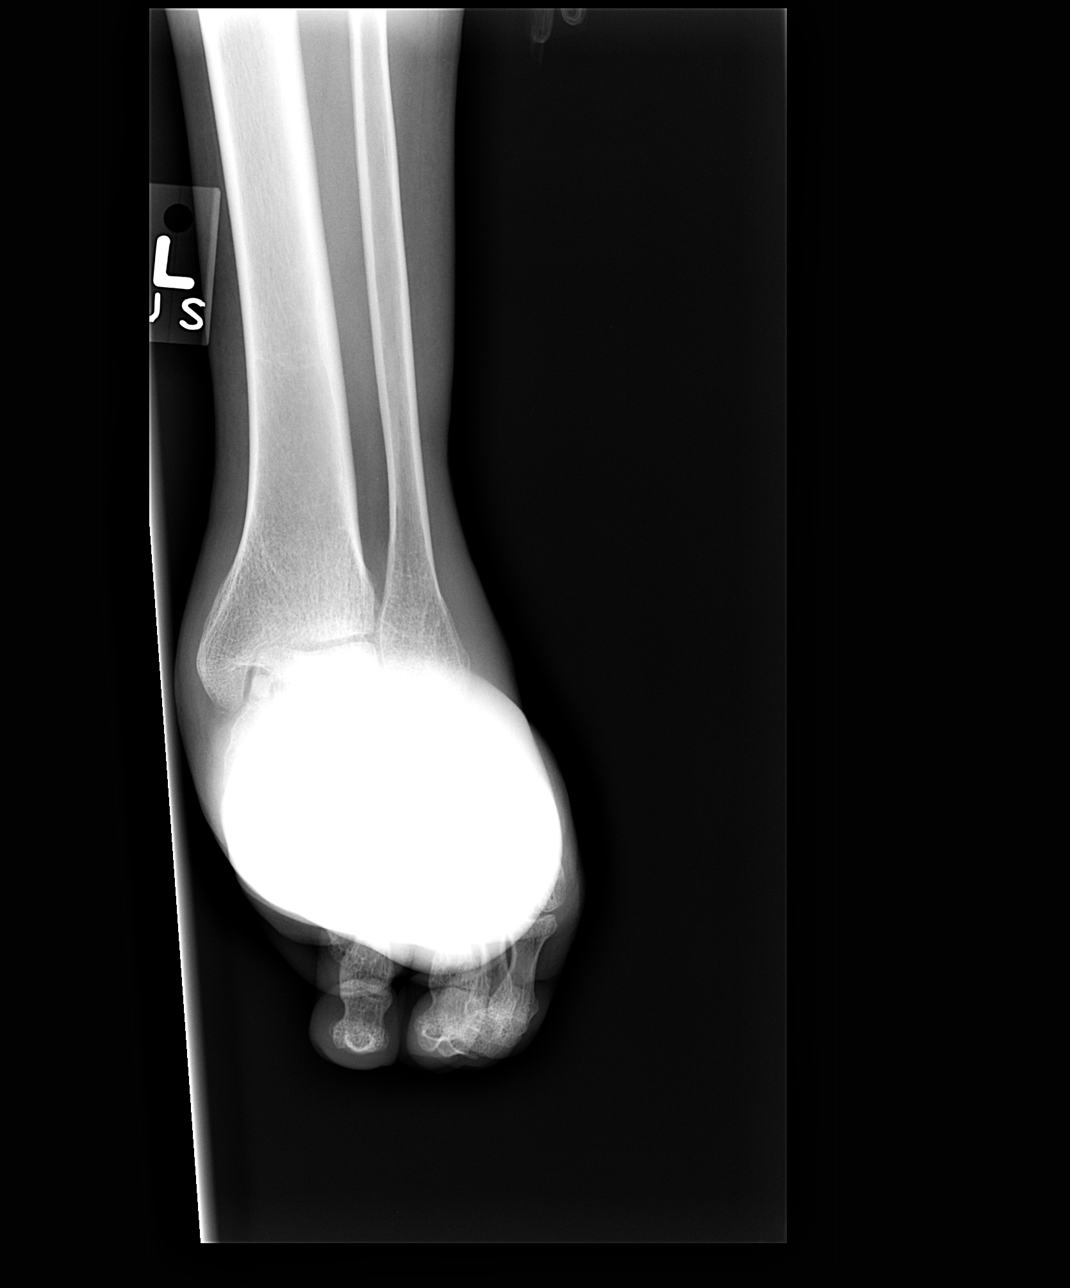

[view not recorded (2 of 3)]
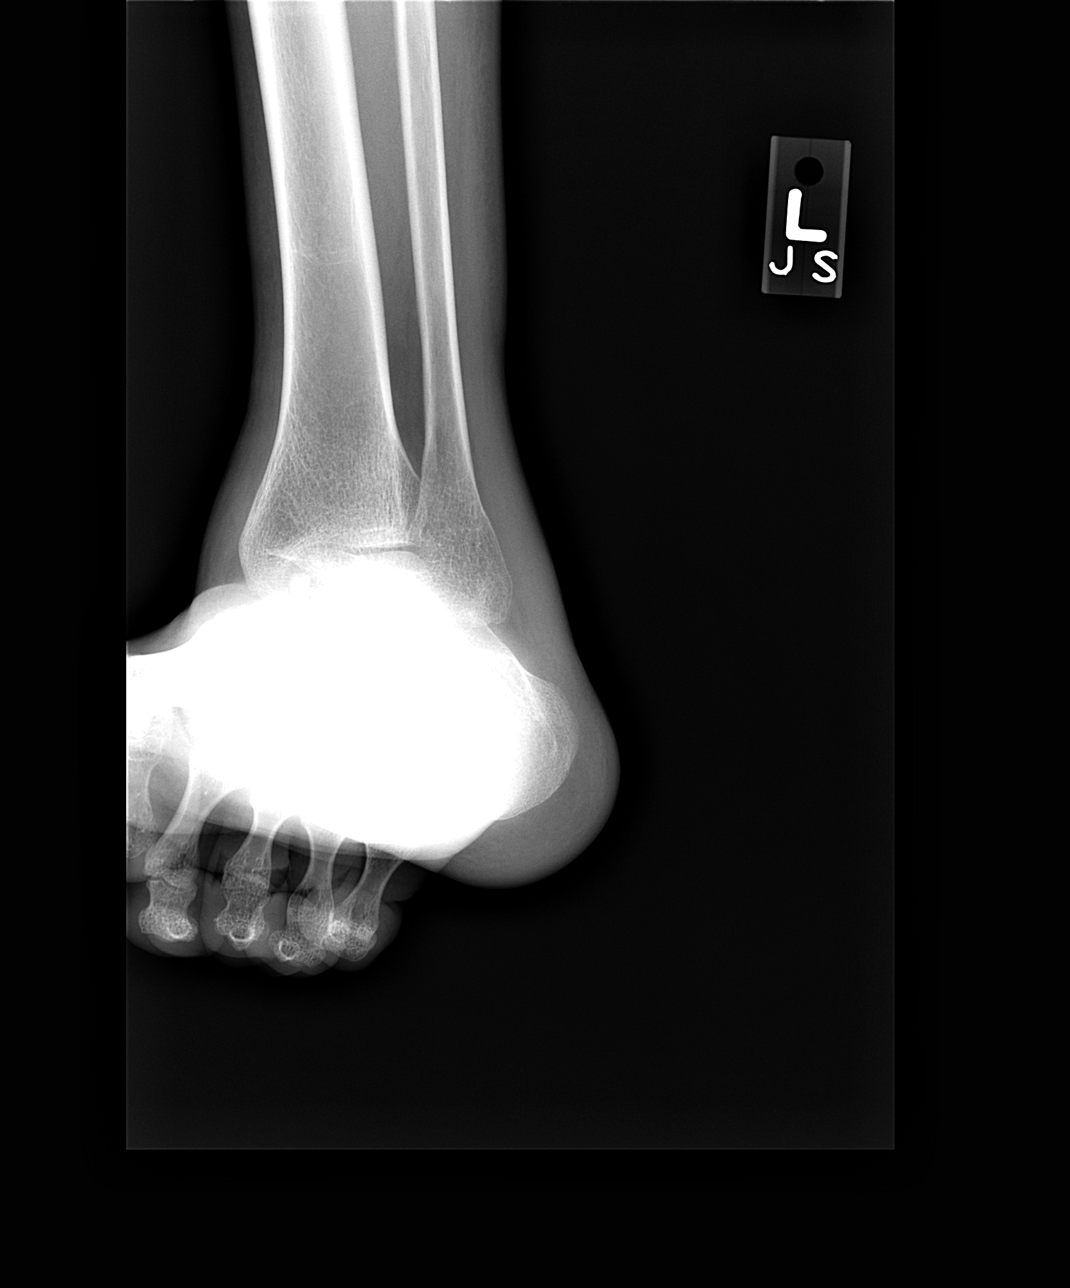

[view not recorded (3 of 3)]
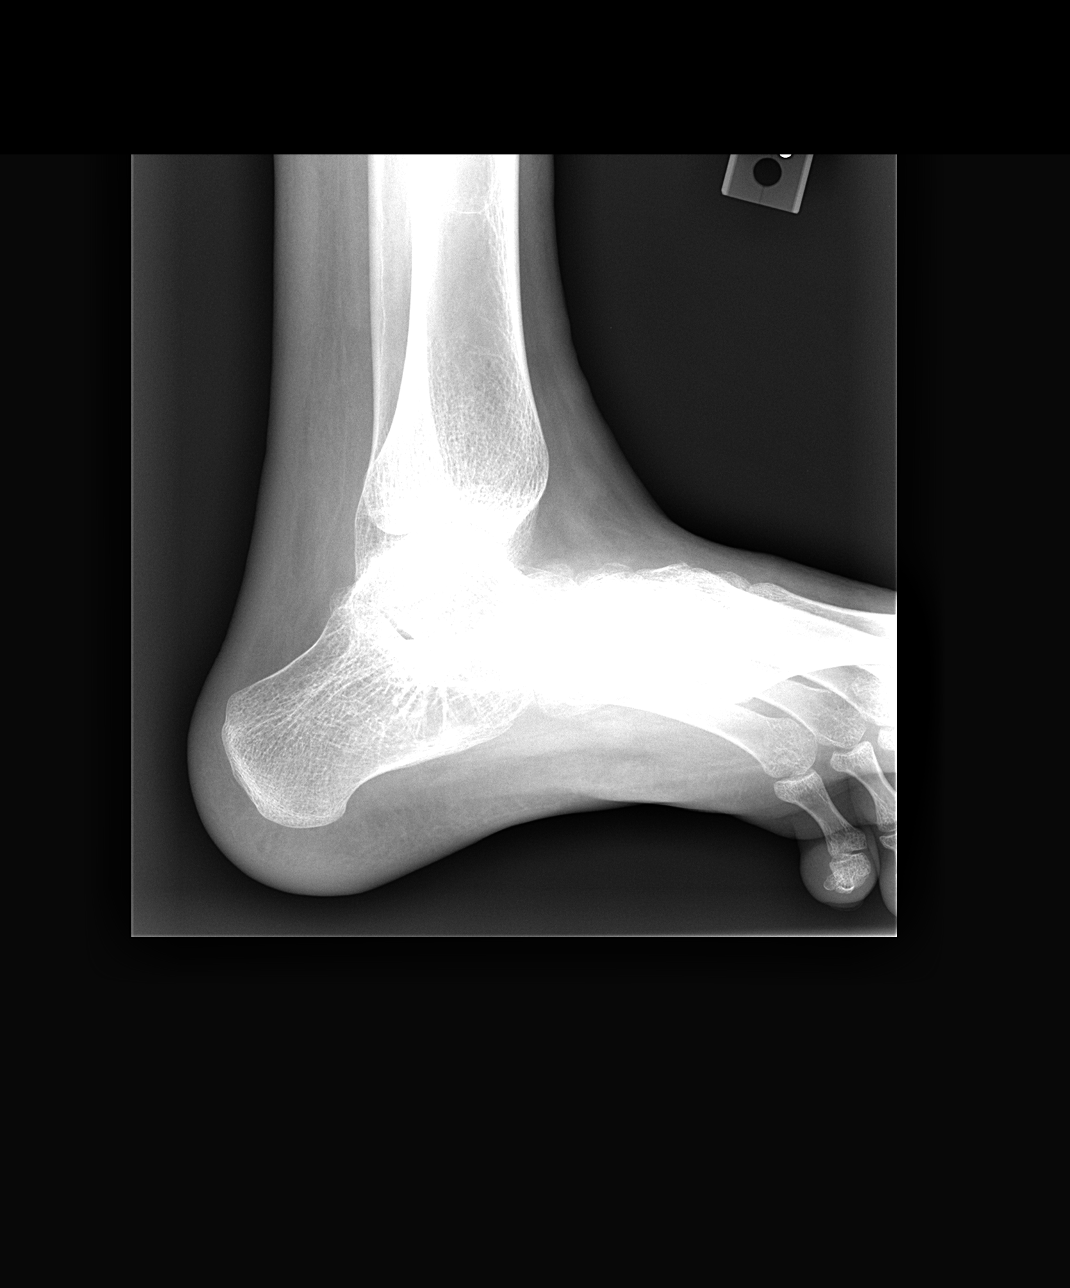

[3 of 3 positions shown; findings below may reference images not displayed]

FINDINGS: Examination is limited in that standard views were not
able to be obtained as the patient is quadriplegic.

No left ankle fracture detected on the nonstandard images.

Prior surgery left first toe with screw in place.  Mild angulation
at the base of the left second metatarsal.  This may be related to
projection however if the patient is tender in this region, subtle
fracture cannot be excluded.  This is not confirmed as a discrete
finding on other views.
IMPRESSION: Non standard imaging had to be performed secondary to patient's
quadriparesis.

No left ankle fracture detected.

Subtle angulation at the base of the left second metatarsal.  This
may be related to angulation although if the patient were tender in
this region, subtle fracture could not be excluded.  There is is
not confirmed as a discrete finding on other views.

This has been made a call report.

## 2010-06-12 IMAGING — CR DG FOOT COMPLETE 3+V*L*
4 series · 4 of 4 positions shown · non-contrast
Comparison: None.

CLINICAL DATA: Left ankle pain and swelling after hitting ankle on
dresser.

LEFT ANKLE COMPLETE - 3+ VIEW,LEFT FOOT - COMPLETE 3+ VIEW

[view not recorded (1 of 4)]
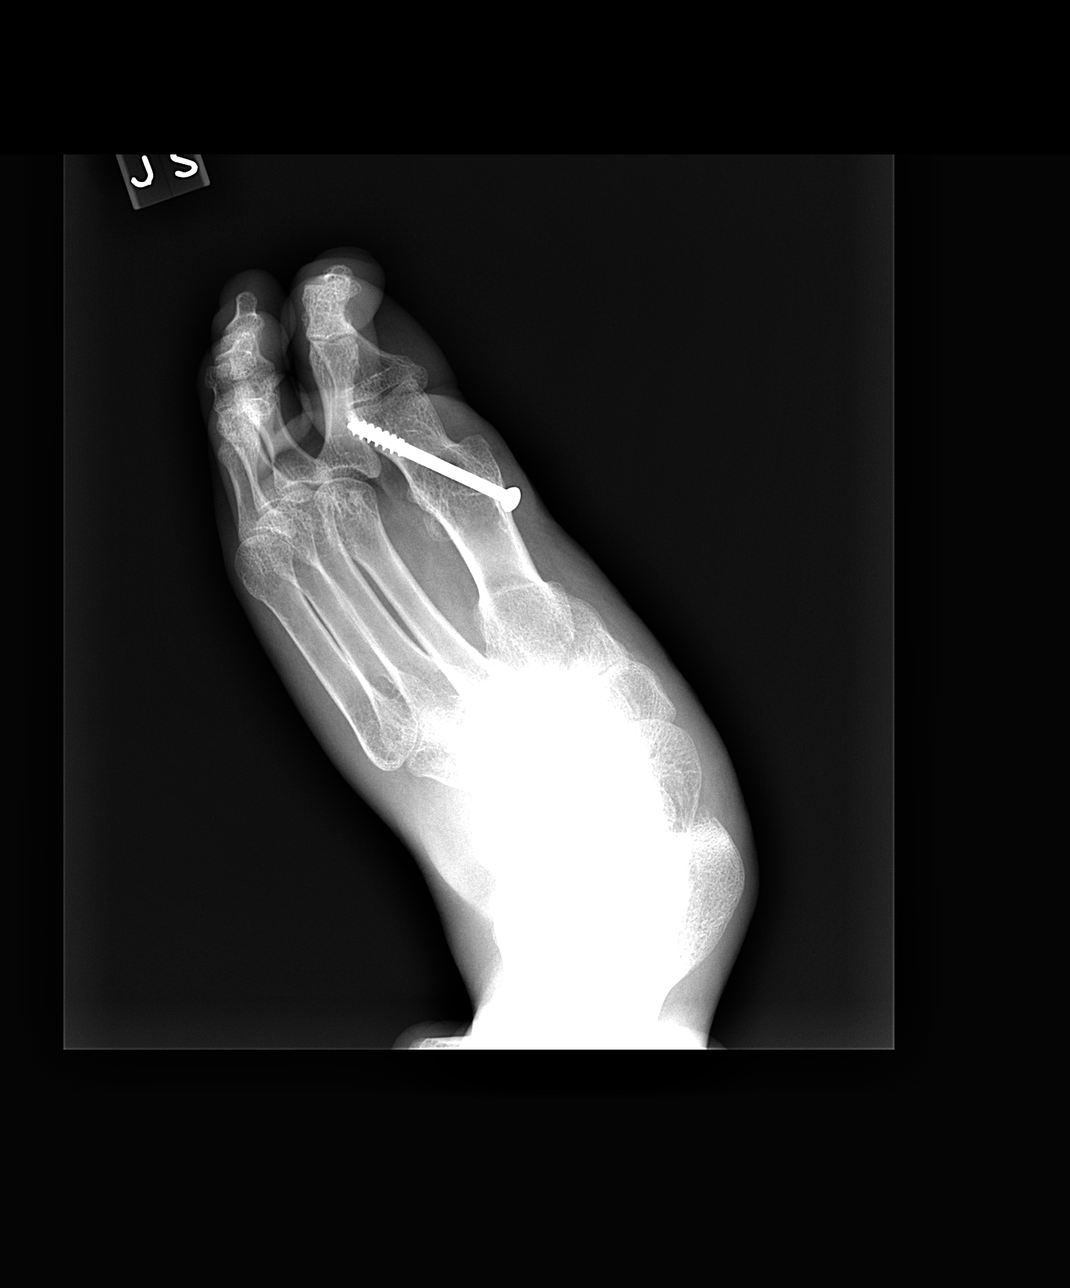

[view not recorded (2 of 4)]
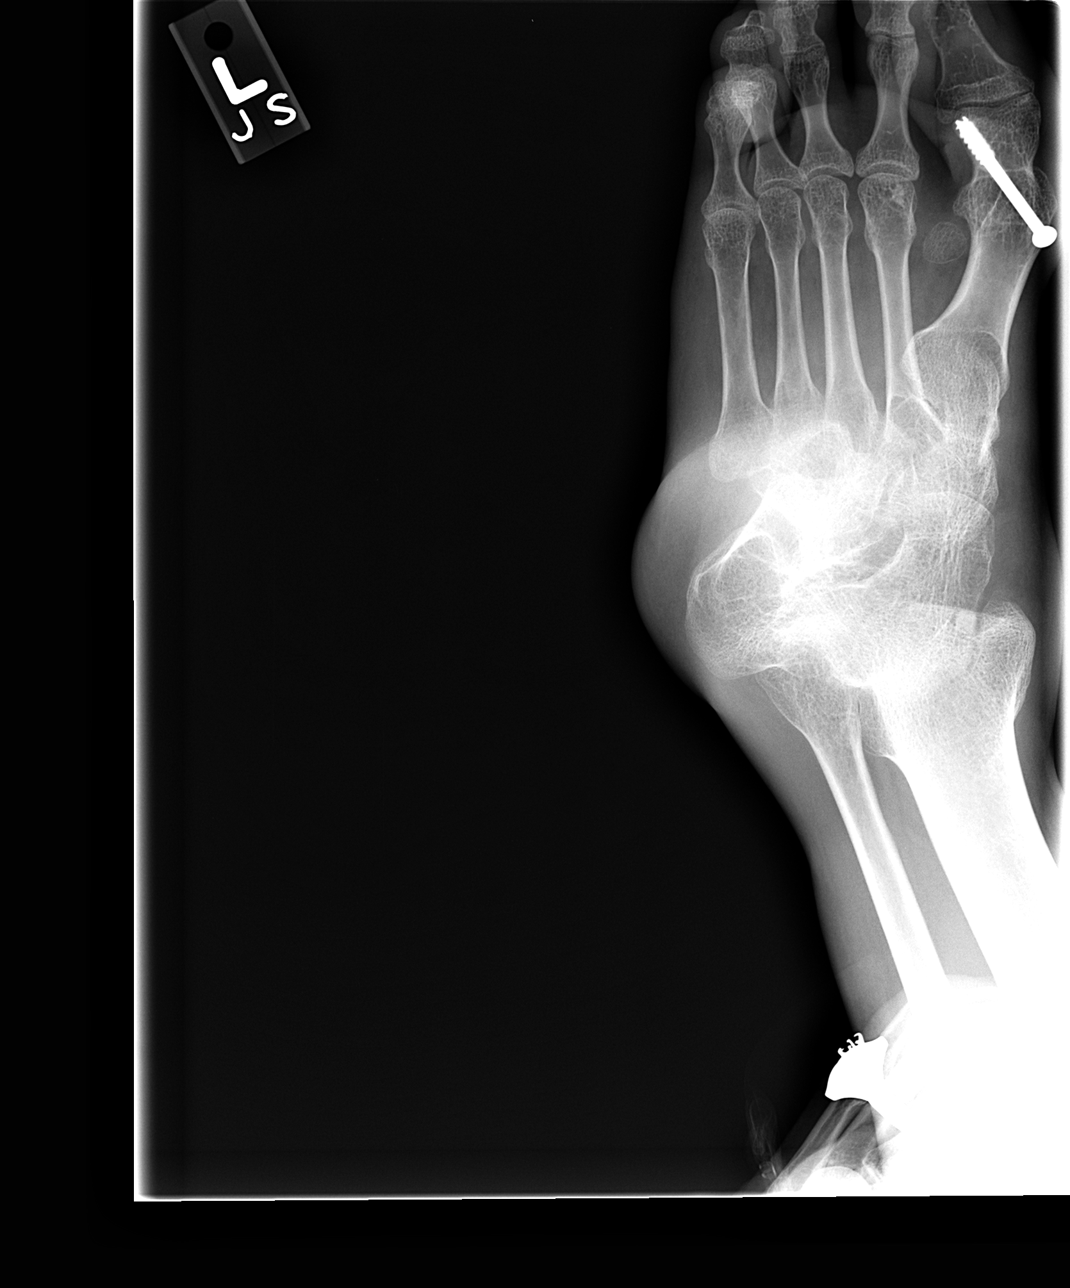

[view not recorded (3 of 4)]
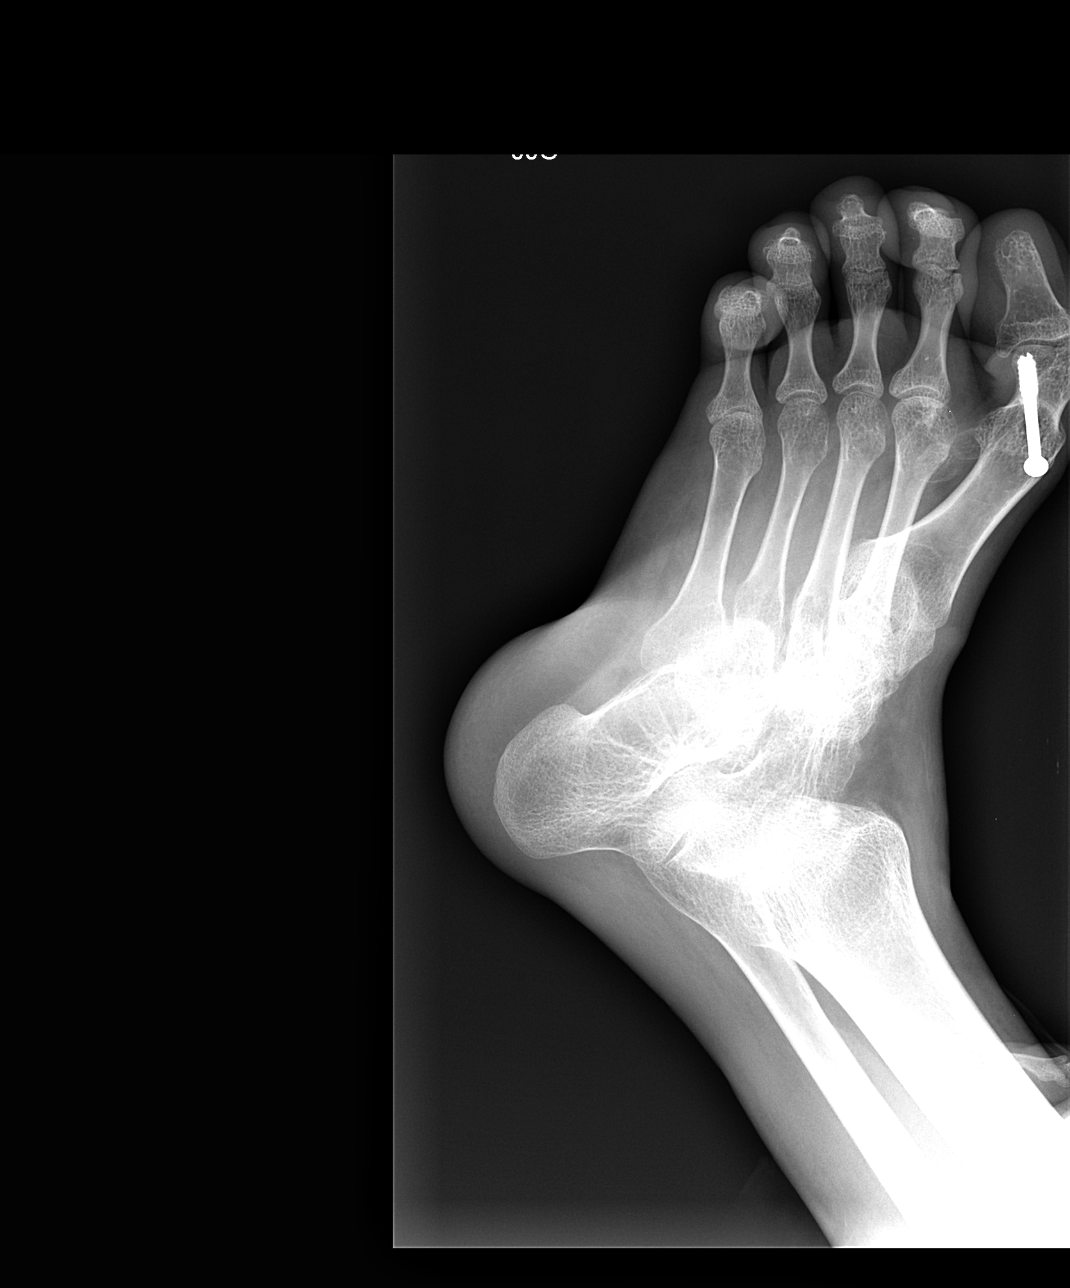

[view not recorded (4 of 4)]
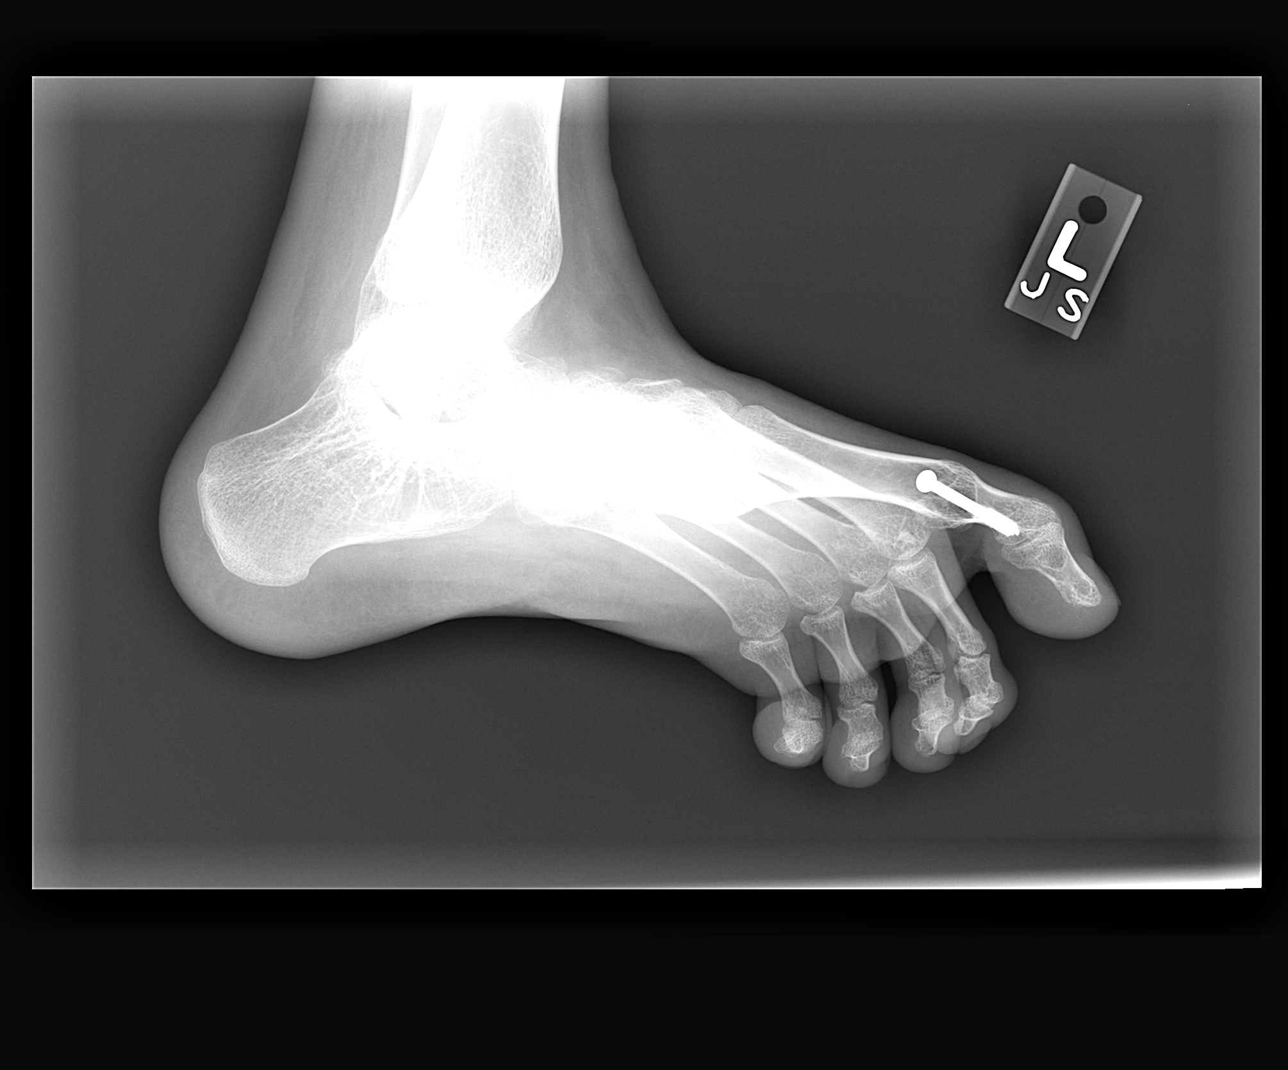

[4 of 4 positions shown; findings below may reference images not displayed]

FINDINGS: Examination is limited in that standard views were not
able to be obtained as the patient is quadriplegic.

No left ankle fracture detected on the nonstandard images.

Prior surgery left first toe with screw in place.  Mild angulation
at the base of the left second metatarsal.  This may be related to
projection however if the patient is tender in this region, subtle
fracture cannot be excluded.  This is not confirmed as a discrete
finding on other views.
IMPRESSION: Non standard imaging had to be performed secondary to patient's
quadriparesis.

No left ankle fracture detected.

Subtle angulation at the base of the left second metatarsal.  This
may be related to angulation although if the patient were tender in
this region, subtle fracture could not be excluded.  There is is
not confirmed as a discrete finding on other views.

This has been made a call report.

## 2010-07-10 ENCOUNTER — Encounter: Payer: Self-pay | Admitting: Family Medicine

## 2010-07-11 ENCOUNTER — Encounter: Payer: Self-pay | Admitting: Family Medicine

## 2010-07-11 ENCOUNTER — Telehealth (INDEPENDENT_AMBULATORY_CARE_PROVIDER_SITE_OTHER): Payer: Self-pay | Admitting: *Deleted

## 2010-07-11 DIAGNOSIS — J690 Pneumonitis due to inhalation of food and vomit: Secondary | ICD-10-CM | POA: Insufficient documentation

## 2010-07-14 ENCOUNTER — Ambulatory Visit: Payer: Self-pay | Admitting: Family Medicine

## 2010-07-14 ENCOUNTER — Telehealth (INDEPENDENT_AMBULATORY_CARE_PROVIDER_SITE_OTHER): Payer: Self-pay | Admitting: *Deleted

## 2010-07-16 ENCOUNTER — Telehealth (INDEPENDENT_AMBULATORY_CARE_PROVIDER_SITE_OTHER): Payer: Self-pay | Admitting: *Deleted

## 2010-07-16 ENCOUNTER — Encounter: Payer: Self-pay | Admitting: Family Medicine

## 2010-07-18 ENCOUNTER — Telehealth (INDEPENDENT_AMBULATORY_CARE_PROVIDER_SITE_OTHER): Payer: Self-pay | Admitting: *Deleted

## 2010-07-18 ENCOUNTER — Encounter: Payer: Self-pay | Admitting: Family Medicine

## 2010-07-28 ENCOUNTER — Encounter: Payer: Self-pay | Admitting: Family Medicine

## 2010-08-01 DIAGNOSIS — E079 Disorder of thyroid, unspecified: Secondary | ICD-10-CM

## 2010-08-01 DIAGNOSIS — F411 Generalized anxiety disorder: Secondary | ICD-10-CM | POA: Insufficient documentation

## 2010-08-01 DIAGNOSIS — F419 Anxiety disorder, unspecified: Secondary | ICD-10-CM | POA: Insufficient documentation

## 2010-08-01 DIAGNOSIS — F418 Other specified anxiety disorders: Secondary | ICD-10-CM | POA: Insufficient documentation

## 2010-08-01 DIAGNOSIS — Z8719 Personal history of other diseases of the digestive system: Secondary | ICD-10-CM | POA: Insufficient documentation

## 2010-08-01 DIAGNOSIS — K219 Gastro-esophageal reflux disease without esophagitis: Secondary | ICD-10-CM | POA: Insufficient documentation

## 2010-08-01 HISTORY — DX: Disorder of thyroid, unspecified: E07.9

## 2010-08-01 HISTORY — DX: Other specified anxiety disorders: F41.8

## 2010-08-04 ENCOUNTER — Telehealth: Payer: Self-pay | Admitting: Internal Medicine

## 2010-08-04 ENCOUNTER — Ambulatory Visit: Payer: Self-pay | Admitting: Internal Medicine

## 2010-08-04 DIAGNOSIS — R633 Feeding difficulties, unspecified: Secondary | ICD-10-CM | POA: Insufficient documentation

## 2010-08-04 DIAGNOSIS — G809 Cerebral palsy, unspecified: Secondary | ICD-10-CM | POA: Insufficient documentation

## 2010-08-05 ENCOUNTER — Ambulatory Visit: Payer: Self-pay | Admitting: Diagnostic Radiology

## 2010-08-05 ENCOUNTER — Ambulatory Visit (HOSPITAL_BASED_OUTPATIENT_CLINIC_OR_DEPARTMENT_OTHER): Admission: RE | Admit: 2010-08-05 | Discharge: 2010-08-05 | Payer: Self-pay | Admitting: Family Medicine

## 2010-08-05 ENCOUNTER — Ambulatory Visit: Payer: Self-pay | Admitting: Family Medicine

## 2010-08-06 ENCOUNTER — Encounter: Payer: Self-pay | Admitting: Family Medicine

## 2010-08-07 ENCOUNTER — Encounter: Payer: Self-pay | Admitting: Family Medicine

## 2010-08-18 ENCOUNTER — Encounter
Admission: RE | Admit: 2010-08-18 | Discharge: 2010-10-22 | Payer: Self-pay | Source: Home / Self Care | Attending: Orthopedic Surgery | Admitting: Orthopedic Surgery

## 2010-09-02 ENCOUNTER — Telehealth: Payer: Self-pay | Admitting: Internal Medicine

## 2010-09-02 ENCOUNTER — Telehealth: Payer: Self-pay | Admitting: Family Medicine

## 2010-09-03 ENCOUNTER — Telehealth: Payer: Self-pay | Admitting: Internal Medicine

## 2010-09-04 ENCOUNTER — Telehealth (INDEPENDENT_AMBULATORY_CARE_PROVIDER_SITE_OTHER): Payer: Self-pay | Admitting: *Deleted

## 2010-09-04 DIAGNOSIS — K9420 Gastrostomy complication, unspecified: Secondary | ICD-10-CM | POA: Insufficient documentation

## 2010-09-08 ENCOUNTER — Encounter: Payer: Self-pay | Admitting: Family Medicine

## 2010-09-12 ENCOUNTER — Ambulatory Visit: Payer: Self-pay | Admitting: Internal Medicine

## 2010-09-12 IMAGING — CT CT ABD-PELV W/ CM
2 of 3 series · 16 of 46 positions shown, 18 images · IV contrast (APPLIED)
Comparison: 06/25/2004

CLINICAL DATA: Abdominal pain, cerebral palsy, spinal fusion and
baclofen pump

CT ABDOMEN AND PELVIS WITH CONTRAST
TECHNIQUE: Multidetector CT imaging of the abdomen and pelvis was
performed following the standard protocol during bolus
administration of intravenous contrast.
Contrast: 1 ml Amnipaque-VFF

[Series 2: abd/pelvis 5.0 b31f · axial · 0.68mm/px · z∈[+750,+1110]mm · 13 of 84 slices shown, 15 images]
[im 6/84  soft-tissue]
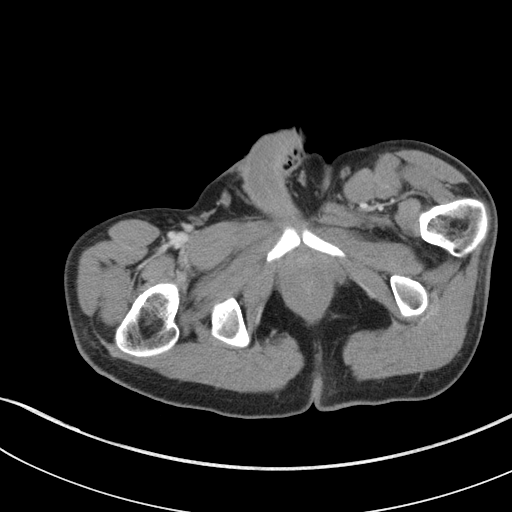
[im 6/84  bone]
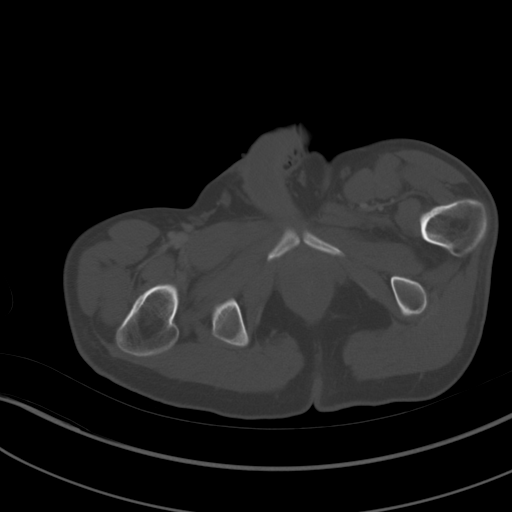
[im 11/84  soft-tissue]
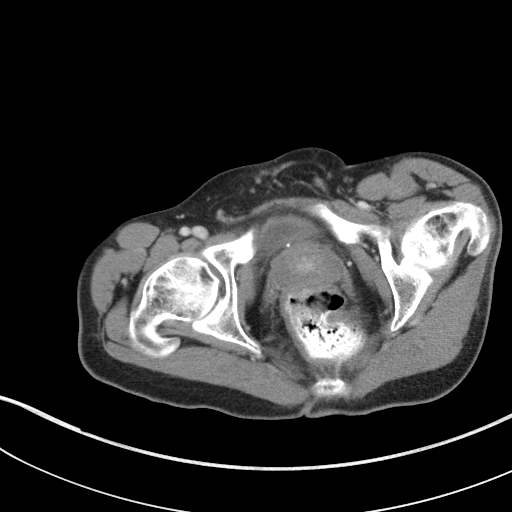
[im 17/84  soft-tissue]
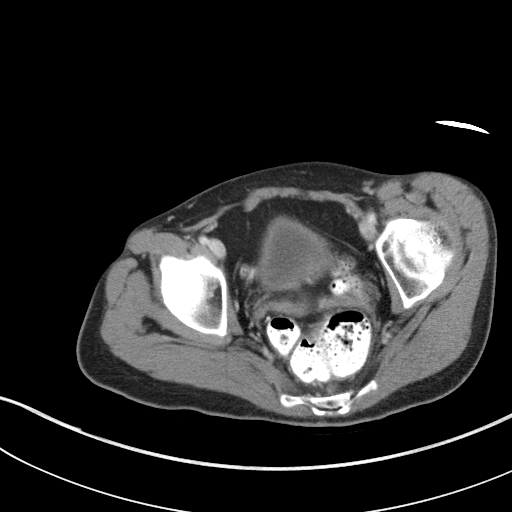
[im 25/84  soft-tissue]
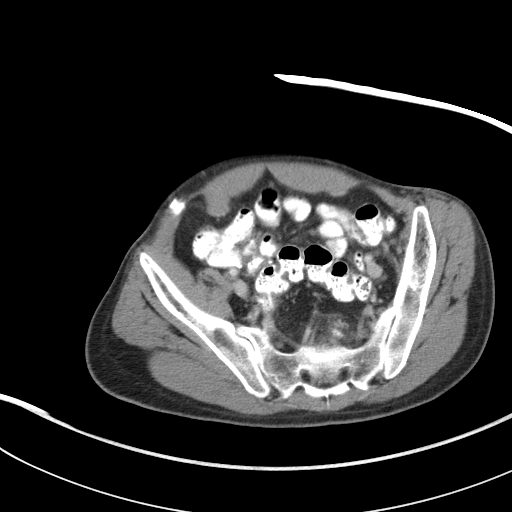
[im 30/84  soft-tissue]
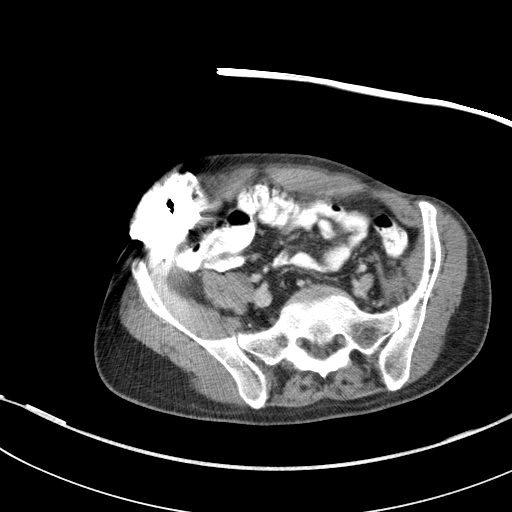
[im 35/84  soft-tissue]
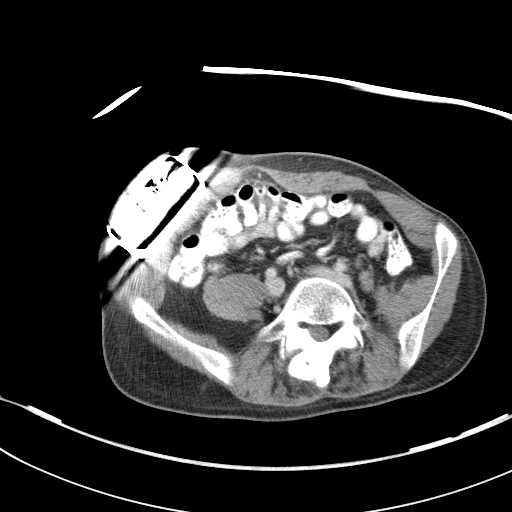
[im 43/84  soft-tissue]
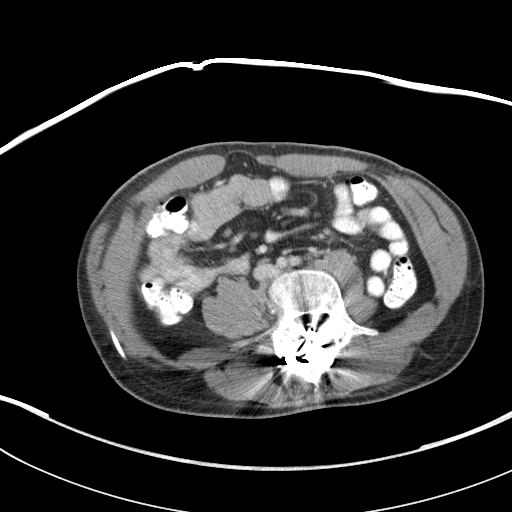
[im 49/84  soft-tissue]
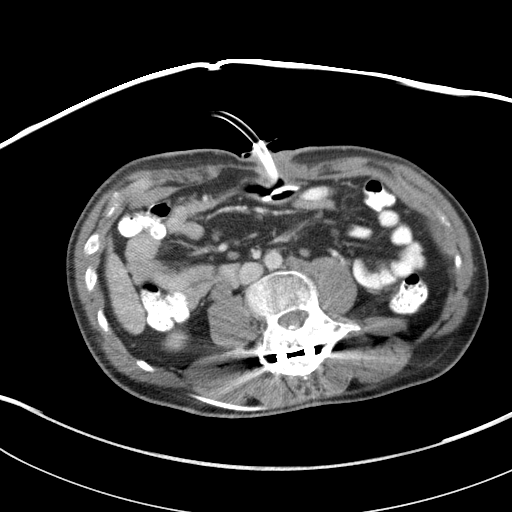
[im 54/84  soft-tissue]
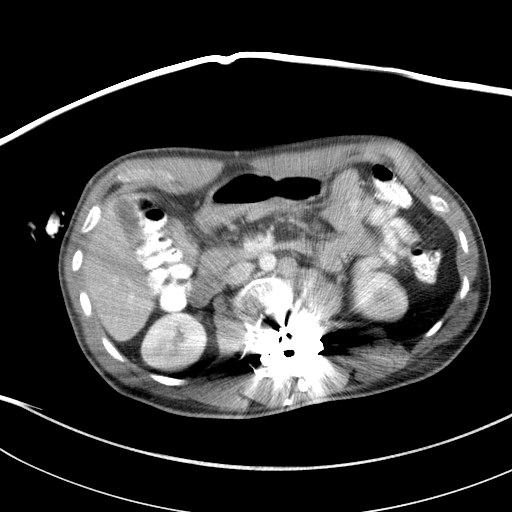
[im 54/84  bone]
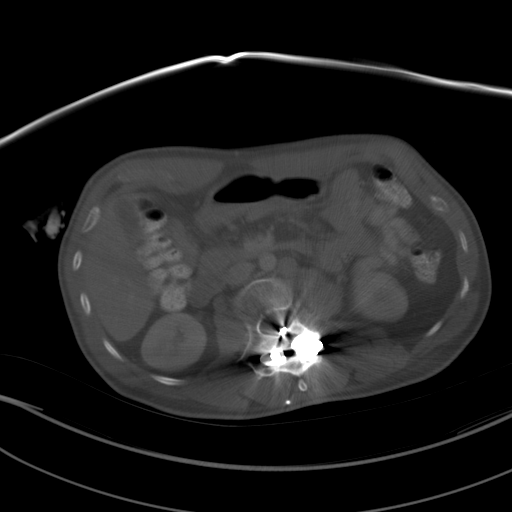
[im 59/84  soft-tissue]
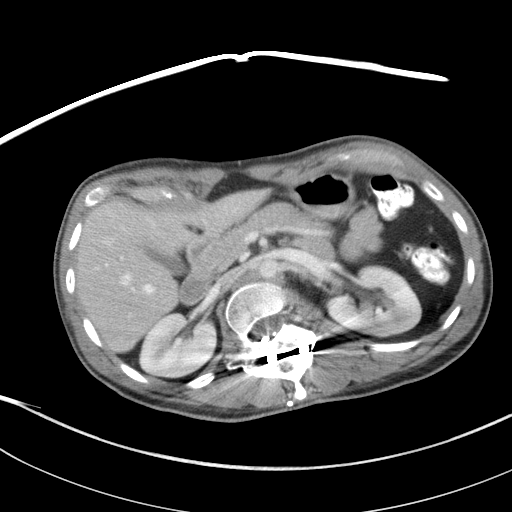
[im 67/84  soft-tissue]
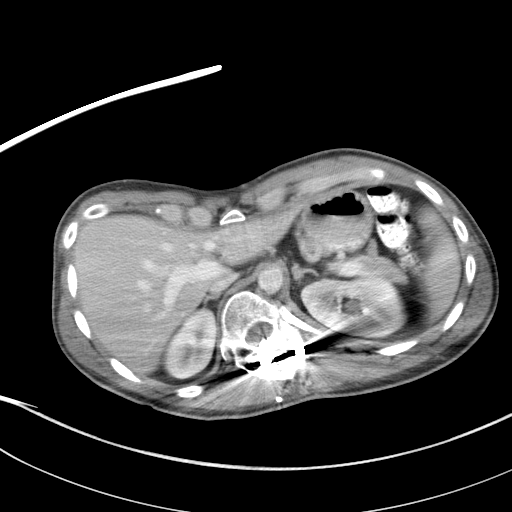
[im 73/84  soft-tissue]
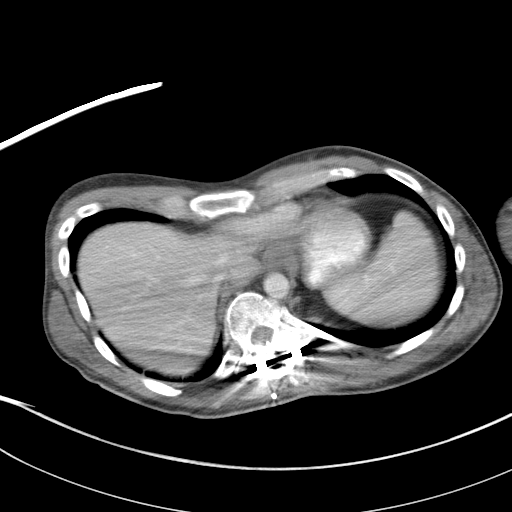
[im 78/84  soft-tissue]
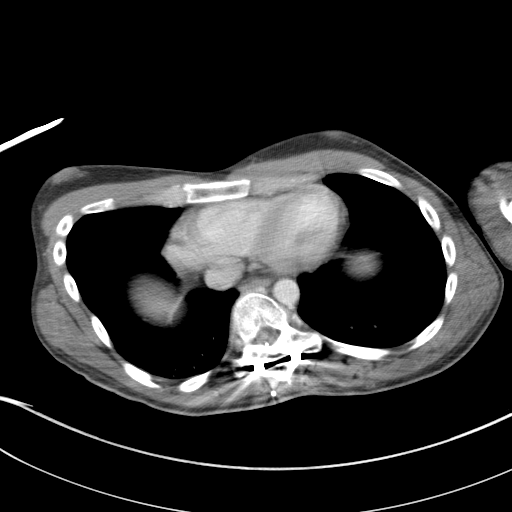

[Series 5: abd/pelvis 3.0 coronal · coronal · 0.61mm/px · 3 of 72 slices shown]
[im 24/72  soft-tissue]
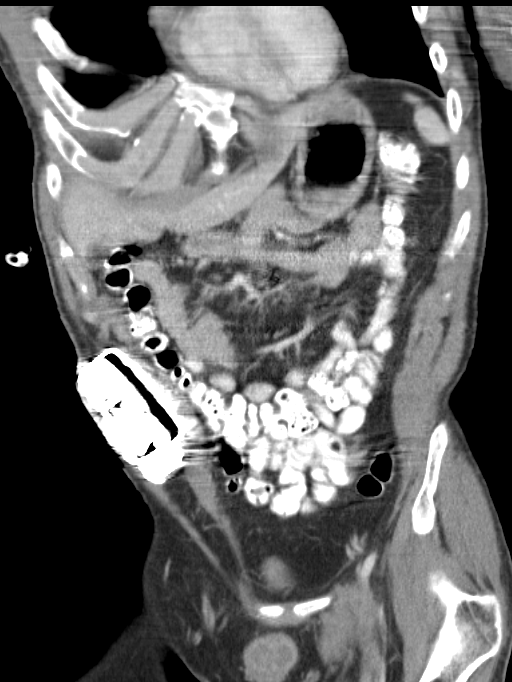
[im 32/72  soft-tissue]
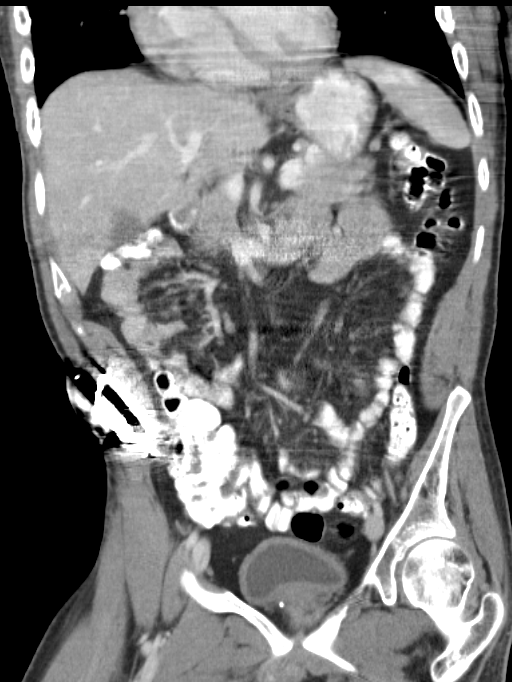
[im 40/72  soft-tissue]
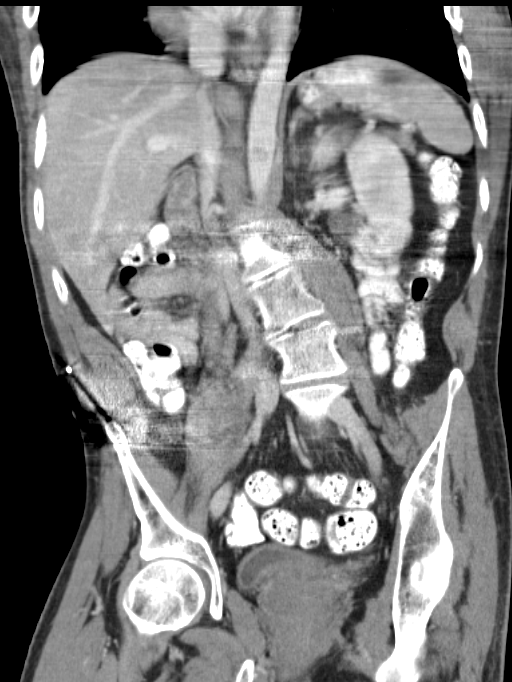

[16 of 46 positions shown; findings below may reference images not displayed]

FINDINGS: Lung bases clear.  No pericardial or pleural effusion.
Thoracic lumbar spine fusion hardware creates artifact.  There is
also motion artifact.  Gastrostomy noted in the stomach.  Liver,
gallbladder, biliary system, adrenal glands, spleen, kidneys, and
pancreas within normal limits for age.  No bowel obstruction,
dilatation, ileus or free air.  No abdominal free fluid, fluid
collection, hemorrhage or abscess.

Baclofen pump in the right lower quadrant creates artifact
throughout the lower abdomen pelvis.  Despite this, the appendix is
visualized and appears normal.  No evidence of inflammatory process
in the lower abdomen or pelvis.  The pelvic calcifications appear
vascular.  Prostate gland is prominent size.  Persistent likely
chronic thickening of the rectum noted.  This is nonspecific.
Urinary bladder is underdistended.  No acute distal bowel process
demonstrated.
IMPRESSION: No acute intra-abdominal or pelvic process.
Postop changes as described
Normal appendix
Prostate enlargement
Rectal wall thickening, question chronic proctitis

## 2010-09-18 ENCOUNTER — Ambulatory Visit: Payer: Self-pay | Admitting: Internal Medicine

## 2010-09-18 ENCOUNTER — Ambulatory Visit (HOSPITAL_COMMUNITY): Admission: RE | Admit: 2010-09-18 | Discharge: 2010-09-18 | Payer: Self-pay | Admitting: Internal Medicine

## 2010-09-19 ENCOUNTER — Encounter: Payer: Self-pay | Admitting: Internal Medicine

## 2010-10-10 ENCOUNTER — Encounter: Payer: Self-pay | Admitting: Family Medicine

## 2010-10-31 ENCOUNTER — Encounter: Payer: Self-pay | Admitting: Family Medicine

## 2010-11-02 HISTORY — PX: OTHER SURGICAL HISTORY: SHX169

## 2010-11-05 ENCOUNTER — Telehealth (INDEPENDENT_AMBULATORY_CARE_PROVIDER_SITE_OTHER): Payer: Self-pay | Admitting: *Deleted

## 2010-11-10 ENCOUNTER — Encounter: Payer: Self-pay | Admitting: Family Medicine

## 2010-11-11 ENCOUNTER — Ambulatory Visit
Admission: RE | Admit: 2010-11-11 | Discharge: 2010-11-11 | Payer: Self-pay | Source: Home / Self Care | Attending: Family Medicine | Admitting: Family Medicine

## 2010-11-11 DIAGNOSIS — M4000 Postural kyphosis, site unspecified: Secondary | ICD-10-CM | POA: Insufficient documentation

## 2010-11-12 ENCOUNTER — Encounter: Payer: Self-pay | Admitting: Family Medicine

## 2010-11-19 ENCOUNTER — Telehealth (INDEPENDENT_AMBULATORY_CARE_PROVIDER_SITE_OTHER): Payer: Self-pay | Admitting: *Deleted

## 2010-11-23 ENCOUNTER — Encounter: Payer: Self-pay | Admitting: Radiation Oncology

## 2010-11-27 ENCOUNTER — Encounter (INDEPENDENT_AMBULATORY_CARE_PROVIDER_SITE_OTHER): Payer: Self-pay | Admitting: *Deleted

## 2010-11-27 ENCOUNTER — Inpatient Hospital Stay (HOSPITAL_COMMUNITY)
Admission: EM | Admit: 2010-11-27 | Discharge: 2010-12-01 | Payer: Self-pay | Source: Home / Self Care | Attending: Internal Medicine | Admitting: Internal Medicine

## 2010-11-27 ENCOUNTER — Encounter: Payer: Self-pay | Admitting: Family Medicine

## 2010-11-27 LAB — CBC
HCT: 28.5 % — ABNORMAL LOW (ref 39.0–52.0)
Hemoglobin: 9.3 g/dL — ABNORMAL LOW (ref 13.0–17.0)
MCH: 27.9 pg (ref 26.0–34.0)
MCHC: 32.6 g/dL (ref 30.0–36.0)
MCV: 85.6 fL (ref 78.0–100.0)
Platelets: 432 10*3/uL — ABNORMAL HIGH (ref 150–400)
RBC: 3.33 MIL/uL — ABNORMAL LOW (ref 4.22–5.81)
RDW: 15.1 % (ref 11.5–15.5)
WBC: 8.2 10*3/uL (ref 4.0–10.5)

## 2010-11-27 LAB — COMPREHENSIVE METABOLIC PANEL
ALT: 33 U/L (ref 0–53)
AST: 27 U/L (ref 0–37)
Albumin: 2.8 g/dL — ABNORMAL LOW (ref 3.5–5.2)
Alkaline Phosphatase: 104 U/L (ref 39–117)
BUN: 16 mg/dL (ref 6–23)
CO2: 27 mEq/L (ref 19–32)
Calcium: 8.6 mg/dL (ref 8.4–10.5)
Chloride: 103 mEq/L (ref 96–112)
Creatinine, Ser: 0.46 mg/dL (ref 0.4–1.5)
GFR calc Af Amer: >60 mL/min (ref >60)
GFR calc non Af Amer: >60 mL/min (ref >60)
Glucose, Bld: 122 mg/dL — ABNORMAL HIGH (ref 70–99)
Potassium: 4.1 mEq/L (ref 3.5–5.1)
Sodium: 139 mEq/L (ref 135–145)
Total Bilirubin: 0.4 mg/dL (ref 0.3–1.2)
Total Protein: 6.3 g/dL (ref 6.0–8.3)

## 2010-11-27 LAB — URINALYSIS, ROUTINE W REFLEX MICROSCOPIC
Bilirubin Urine: NEGATIVE
Hgb urine dipstick: NEGATIVE
Ketones, ur: NEGATIVE mg/dL
Nitrite: NEGATIVE
Protein, ur: NEGATIVE mg/dL
Specific Gravity, Urine: 1.027 (ref 1.005–1.030)
Urine Glucose, Fasting: NEGATIVE mg/dL
Urobilinogen, UA: 0.2 mg/dL (ref 0.0–1.0)
pH: 7.5 (ref 5.0–8.0)

## 2010-11-27 LAB — DIFFERENTIAL
Basophils Absolute: 0 10*3/uL (ref 0.0–0.1)
Basophils Relative: 0 % (ref 0–1)
Eosinophils Absolute: 0.1 10*3/uL (ref 0.0–0.7)
Eosinophils Relative: 1 % (ref 0–5)
Lymphocytes Relative: 21 % (ref 12–46)
Lymphs Abs: 1.7 10*3/uL (ref 0.7–4.0)
Monocytes Absolute: 0.7 10*3/uL (ref 0.1–1.0)
Monocytes Relative: 9 % (ref 3–12)
Neutro Abs: 5.6 10*3/uL (ref 1.7–7.7)
Neutrophils Relative %: 69 % (ref 43–77)

## 2010-11-27 LAB — LIPASE, BLOOD: Lipase: 32 U/L (ref 11–59)

## 2010-11-27 LAB — OCCULT BLOOD, POC DEVICE: Fecal Occult Bld: NEGATIVE

## 2010-11-28 ENCOUNTER — Encounter (INDEPENDENT_AMBULATORY_CARE_PROVIDER_SITE_OTHER): Payer: Self-pay | Admitting: *Deleted

## 2010-11-28 LAB — BASIC METABOLIC PANEL
BUN: 8 mg/dL (ref 6–23)
CO2: 23 mEq/L (ref 19–32)
Calcium: 8.3 mg/dL — ABNORMAL LOW (ref 8.4–10.5)
Chloride: 106 mEq/L (ref 96–112)
Creatinine, Ser: 0.46 mg/dL (ref 0.4–1.5)
GFR calc Af Amer: >60 mL/min (ref >60)
GFR calc non Af Amer: >60 mL/min (ref >60)
Glucose, Bld: 89 mg/dL (ref 70–99)
Potassium: 3.7 mEq/L (ref 3.5–5.1)
Sodium: 140 mEq/L (ref 135–145)

## 2010-11-28 LAB — CBC
HCT: 22.6 % — ABNORMAL LOW (ref 39.0–52.0)
Hemoglobin: 7.3 g/dL — ABNORMAL LOW (ref 13.0–17.0)
MCH: 28.1 pg (ref 26.0–34.0)
MCHC: 32.3 g/dL (ref 30.0–36.0)
MCV: 86.9 fL (ref 78.0–100.0)
Platelets: 434 10*3/uL — ABNORMAL HIGH (ref 150–400)
RBC: 2.6 MIL/uL — ABNORMAL LOW (ref 4.22–5.81)
RDW: 15.5 % (ref 11.5–15.5)
WBC: 10.2 10*3/uL (ref 4.0–10.5)

## 2010-11-29 LAB — BASIC METABOLIC PANEL
BUN: 5 mg/dL — ABNORMAL LOW (ref 6–23)
CO2: 23 mEq/L (ref 19–32)
Calcium: 7.9 mg/dL — ABNORMAL LOW (ref 8.4–10.5)
Chloride: 107 mEq/L (ref 96–112)
Creatinine, Ser: 0.42 mg/dL (ref 0.4–1.5)
GFR calc Af Amer: >60 mL/min (ref >60)
GFR calc non Af Amer: >60 mL/min (ref >60)
Glucose, Bld: 113 mg/dL — ABNORMAL HIGH (ref 70–99)
Potassium: 3.2 mEq/L — ABNORMAL LOW (ref 3.5–5.1)
Sodium: 138 mEq/L (ref 135–145)

## 2010-11-29 LAB — VANCOMYCIN, TROUGH: Vancomycin Tr: 6 ug/mL — ABNORMAL LOW (ref 10.0–20.0)

## 2010-11-30 ENCOUNTER — Encounter (INDEPENDENT_AMBULATORY_CARE_PROVIDER_SITE_OTHER): Payer: Self-pay | Admitting: *Deleted

## 2010-11-30 LAB — DIFFERENTIAL
Basophils Absolute: 0 10*3/uL (ref 0.0–0.1)
Basophils Relative: 0 % (ref 0–1)
Eosinophils Absolute: 0.2 10*3/uL (ref 0.0–0.7)
Eosinophils Relative: 3 % (ref 0–5)
Lymphocytes Relative: 35 % (ref 12–46)
Lymphs Abs: 2.4 10*3/uL (ref 0.7–4.0)
Monocytes Absolute: 0.6 10*3/uL (ref 0.1–1.0)
Monocytes Relative: 9 % (ref 3–12)
Neutro Abs: 3.6 10*3/uL (ref 1.7–7.7)
Neutrophils Relative %: 53 % (ref 43–77)

## 2010-11-30 LAB — COMPREHENSIVE METABOLIC PANEL
ALT: 23 U/L (ref 0–53)
AST: 16 U/L (ref 0–37)
Albumin: 2.4 g/dL — ABNORMAL LOW (ref 3.5–5.2)
Alkaline Phosphatase: 65 U/L (ref 39–117)
BUN: 6 mg/dL (ref 6–23)
CO2: 26 mEq/L (ref 19–32)
Calcium: 8.2 mg/dL — ABNORMAL LOW (ref 8.4–10.5)
Chloride: 106 mEq/L (ref 96–112)
Creatinine, Ser: 0.5 mg/dL (ref 0.4–1.5)
GFR calc Af Amer: >60 mL/min (ref >60)
GFR calc non Af Amer: >60 mL/min (ref >60)
Glucose, Bld: 126 mg/dL — ABNORMAL HIGH (ref 70–99)
Potassium: 3.4 mEq/L — ABNORMAL LOW (ref 3.5–5.1)
Sodium: 141 mEq/L (ref 135–145)
Total Bilirubin: 0.4 mg/dL (ref 0.3–1.2)
Total Protein: 5.5 g/dL — ABNORMAL LOW (ref 6.0–8.3)

## 2010-11-30 LAB — CBC
HCT: 22.7 % — ABNORMAL LOW (ref 39.0–52.0)
Hemoglobin: 7.3 g/dL — ABNORMAL LOW (ref 13.0–17.0)
MCH: 27.8 pg (ref 26.0–34.0)
MCHC: 32.2 g/dL (ref 30.0–36.0)
MCV: 86.3 fL (ref 78.0–100.0)
Platelets: 580 10*3/uL — ABNORMAL HIGH (ref 150–400)
RBC: 2.63 MIL/uL — ABNORMAL LOW (ref 4.22–5.81)
RDW: 15.6 % — ABNORMAL HIGH (ref 11.5–15.5)
WBC: 6.7 10*3/uL (ref 4.0–10.5)

## 2010-11-30 LAB — ABO/RH: ABO/RH(D): A POS

## 2010-11-30 LAB — MAGNESIUM: Magnesium: 2.4 mg/dL (ref 1.5–2.5)

## 2010-12-01 ENCOUNTER — Encounter: Payer: Self-pay | Admitting: Family Medicine

## 2010-12-01 LAB — DIFFERENTIAL
Basophils Absolute: 0 10*3/uL (ref 0.0–0.1)
Basophils Relative: 0 % (ref 0–1)
Eosinophils Absolute: 0.2 10*3/uL (ref 0.0–0.7)
Eosinophils Relative: 3 % (ref 0–5)
Lymphocytes Relative: 32 % (ref 12–46)
Lymphs Abs: 1.8 10*3/uL (ref 0.7–4.0)
Monocytes Absolute: 0.3 10*3/uL (ref 0.1–1.0)
Monocytes Relative: 6 % (ref 3–12)
Neutro Abs: 3.2 10*3/uL (ref 1.7–7.7)
Neutrophils Relative %: 58 % (ref 43–77)

## 2010-12-01 LAB — COMPREHENSIVE METABOLIC PANEL
ALT: 21 U/L (ref 0–53)
AST: 15 U/L (ref 0–37)
Albumin: 2.4 g/dL — ABNORMAL LOW (ref 3.5–5.2)
Alkaline Phosphatase: 56 U/L (ref 39–117)
BUN: 8 mg/dL (ref 6–23)
CO2: 25 mEq/L (ref 19–32)
Calcium: 8.2 mg/dL — ABNORMAL LOW (ref 8.4–10.5)
Chloride: 109 mEq/L (ref 96–112)
Creatinine, Ser: 0.44 mg/dL (ref 0.4–1.5)
GFR calc Af Amer: >60 mL/min (ref >60)
GFR calc non Af Amer: >60 mL/min (ref >60)
Glucose, Bld: 126 mg/dL — ABNORMAL HIGH (ref 70–99)
Potassium: 4 mEq/L (ref 3.5–5.1)
Sodium: 141 mEq/L (ref 135–145)
Total Bilirubin: 0.3 mg/dL (ref 0.3–1.2)
Total Protein: 5.3 g/dL — ABNORMAL LOW (ref 6.0–8.3)

## 2010-12-01 LAB — CBC
HCT: 26 % — ABNORMAL LOW (ref 39.0–52.0)
Hemoglobin: 8.5 g/dL — ABNORMAL LOW (ref 13.0–17.0)
MCH: 28.1 pg (ref 26.0–34.0)
MCHC: 32.7 g/dL (ref 30.0–36.0)
MCV: 85.8 fL (ref 78.0–100.0)
Platelets: 610 10*3/uL — ABNORMAL HIGH (ref 150–400)
RBC: 3.03 MIL/uL — ABNORMAL LOW (ref 4.22–5.81)
RDW: 15.3 % (ref 11.5–15.5)
WBC: 5.5 10*3/uL (ref 4.0–10.5)

## 2010-12-01 LAB — CROSSMATCH
ABO/RH(D): A POS
Antibody Screen: NEGATIVE
Unit division: 0

## 2010-12-01 LAB — MAGNESIUM: Magnesium: 2.4 mg/dL (ref 1.5–2.5)

## 2010-12-01 LAB — VANCOMYCIN, TROUGH: Vancomycin Tr: 8.8 ug/mL — ABNORMAL LOW (ref 10.0–20.0)

## 2010-12-01 NOTE — H&P (Signed)
[name], [name]               ACCOUNT NO.:  [id number]  MEDICAL RECORD NO.:  [id number]          PATIENT TYPE:  INP  LOCATION:  4506                         FACILITY:  [facility]  PHYSICIAN:  Hillery Aldo, M.D.   DATE OF BIRTH:  1986-05-05  DATE OF ADMISSION:  11/27/2010 DATE OF DISCHARGE:                             HISTORY & PHYSICAL   PRIMARY CARE PHYSICIAN:  Loreen Freud, M.D.  CHIEF COMPLAINT:  Nausea and vomiting, ongoing fever, and diarrhea.  HISTORY OF PRESENT ILLNESS:  The patient is a 25 year old male with past medical history of cerebral palsy and kyphosis who underwent spinal fusion at [facility] on November 20, 2010.  The patient developed postoperative aspiration pneumonia and was treated with vancomycin and Zosyn and ultimately discharged home yesterday.  While at home, the patient continued to spike fevers and then subsequently developed an episode of vomiting up his tube feedings this morning.  The patient has a history of neurogenic dysphagia and has a percutaneous enteral gastrostomy tube for which he recently was started on tube feedings.  The patient is functioning at his baseline, he is able to take in some p.o. Nevertheless, the patient was evaluated by the emergency department physician and found to be febrile with a documented temperature of 101 and had an abdominal film showing mild ileus, and he subsequently has been referred to the hospitalist service for further evaluation and treatment.  PAST MEDICAL HISTORY: 1. Cerebral palsy. 2. Gastroesophageal reflux disease. 3. Mental retardation. 4. History of esophagitis. 5. Mood disorder. 6. Grade 3 kidney reflux. 7. 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 8. Neurogenic bladder. 9. History of hyperthyroidism. 10.Kyphosis status post spinal fusion.  PAST SURGICAL HISTORY: 1. Strabismus surgery x2. 2. Tympanostomy tubes. 3. Tonsillectomy and  adenoidectomy. 4. Hamstring, adductor, and heel cord release in 1989. 5. Intrathecal baclofen pump implant in 1999. 6. Spinal fusion in 2000 and again just recently. 7. Hamstring release, 2000. 8. Bilateral great toe IP fusion in 2002. 9. Baclofen pump and catheter revision in April 2003.  FAMILY HISTORY:  The patient's mother has asthma.  The patient's father has colonic polyps.  The patient's maternal grandmother had breast cancer, the patient's paternal grandfather had heart disease, and the patient's maternal grandfather had prostate cancer.  SOCIAL HISTORY:  The patient is single and disabled.  He is wheelchair bound at baseline and answers questions nonverbally.  He has no history of tobacco, alcohol, or drug use.  ALLERGIES:  BACTRIM and SULFA.  CURRENT MEDICATIONS: 1. Depakote sprinkles 250 mg p.o. q.a.m. and 500 mg p.o. q.p.m. 2. Paxil 20 mg p.o. daily. 3. Zyprexa 5 mg p.o. at bedtime. 4. Alprazolam 0.5 mg, 1/2 to 1 tablet p.o. p.r.n. agitation and     anxiety. 5. Valium 5 mg p.o. b.i.d. p.r.n. muscle spasm. 6. Doxycycline 100 mg p.o. daily. 7. Retin-A 0.1% cream at bedtime. 8. Albuterol sulfate 2.5 mg/3 mL, 1 neb q.6 h. p.r.n. chest     congestion. 9. Oxycodone 5 mg, 1-2 tablets p.o. q.4-6 h. p.r.n. pain. 10.Coats Aloe Vera liquid by mouth as directed. 11.Carafate 1 g  in 10 mL, 10 mL p.o. b.i.d.  REVIEW OF SYSTEMS:  As obtained by the patient's mother as the patient is nonverbal, but able to answer to yes and no questions.  There has been fever, but no frank weight loss.  The patient denies any dyspnea. The patient's mother reports moist cough.  The patient denies abdominal pain.  One episode of nausea and vomiting over the past 24 hours and one episode of diarrhea.  No frank blood in the stools.  All other systems reviewed and are unremarkable.  PHYSICAL EXAMINATION:  VITAL SIGNS:  Temperature 101, pulse 119, respirations 20, blood pressure 112/65, O2 saturation  99% on room air. GENERAL:  A well-developed, well-nourished male in no acute distress. HEENT:  Normocephalic, atraumatic.  PERRL.  Pupils are somewhat dilated, but equal and responsive to light and accommodation.  Extraocular movements are intact.  Oropharynx is clear. NECK:  Supple, no thyromegaly, no lymphadenopathy, no jugular venous distention. CHEST:  Lungs clear to auscultation bilaterally with good air movement. HEART:  Tachycardic rate, regular rhythm.  No murmurs, rubs or gallops. ABDOMEN:  Soft, nontender, nondistended.  There are normoactive bowel sounds.  Percutaneous enteral gastrostomy tube noted in left upper quadrant. EXTREMITIES:  Foot drop bilaterally.  No clubbing, edema, or cyanosis. SKIN:  Warm and dry.  No rashes.  Surgical operative site without signs of infection.  Steri-Strips intact to this area. NEUROLOGIC:  The patient is alert.  Unable to assess orientation.  He has chronic neurologic deficits related to his cerebral palsy.  DATA REVIEW:  Two views of the abdomen showed questionable mild ileus.  Chest x-ray shows mild posterior lobe atelectasis versus airspace disease.  LABORATORY DATA:  White blood cell count is 8.2, hemoglobin 9.3, hematocrit 28.5, and platelets 432.  Sodium is 139, potassium 4.1, chloride 103, bicarb 27, BUN 16, creatinine 0.46, glucose 122, calcium 8.6, total bilirubin 0.4, alkaline phosphatase 104, AST 27, ALT 33, total protein 6.3, albumin 2.8.  Lipase was 32.  Fecal occult blood testing was negative.  Urinalysis negative for nitrites and leukocytes.  ASSESSMENT/PLAN: 1. Aspiration pneumonia:  We will admit the patient and place him on     aspiration precautions.  Would restart Zosyn and vancomycin and     obtain two sets of blood cultures.  We will hold tube feedings for     now given his postoperative ileus. 2. Nausea and vomiting secondary to ileus:  We will hold the patient's     tube feeds and only slowly reintroduce him at  a lower rate.  We     will start him on Reglan. 3. Gastroesophageal reflux disease:  The patient is not currently on     proton pump inhibitor therapy, and given his high risk for     pneumonias, would not initiate this unless absolutely necessary. 4. Mood disorder:  Continue the patient's Depakote and Zyprexa. 5. Anemia:  Unclear if this is postoperative.  We will monitor. 6. Prophylaxis:  Initiate deep vein thrombosis prophylaxis with     Lovenox.  Time spent on admission including face-to-face time equals 1 hour.     Hillery Aldo, M.D.     CR/MEDQ  D:  11/27/2010  T:  11/28/2010  Job:  [id number]  Electronically Signed by Hillery Aldo M.D. on 12/01/2010 08:07:42 PM

## 2010-12-02 NOTE — Assessment & Plan Note (Signed)
Summary: FILL OUT FORM             Complete Medication List: 1)  Aciphex 20 Mg Tbec (Rabeprazole sodium) .Marland Kitchen.. 1 twice daily 2)  Depakote Sprinkles 125 Mg Cpsp (Divalproex sodium) .... Take 1 capsule twice a day 3)  Paxil 30 Mg Tabs (Paroxetine hcl)     ]

## 2010-12-02 NOTE — Miscellaneous (Signed)
Summary: [facility]   Imported By: Freddy Jaksch 09/27/2007 11:03:33  _____________________________________________________________________  External Attachment:    Type:   Image     Comment:   External Document

## 2010-12-02 NOTE — Assessment & Plan Note (Signed)
Summary: cpx//lch   Vital Signs:  Patient profile:   25 year old male Height:      63 inches Weight:      115 pounds BMI:     20.44 Temp:     98.3 degrees F oral Pulse rate:   68 / minute Pulse rhythm:   regular BP sitting:   116 / 74  (left arm) Cuff size:   regular  Vitals Entered By: Army Fossa CMA (April 25, 2010 9:07 AM) CC: Pt here for CPX   History of Present Illness: Pt here for cpe --mom is present.  Weight and height is approx since pt is in wheelchair and can not stand on his own.  No problems.  He needs refills.  Pt having surgery in august to have baclofen pump removed--Dr Elvina Sidle.    Preventive Screening-Counseling & Management  Alcohol-Tobacco     Alcohol drinks/day: 0     Smoking Status: never  Caffeine-Diet-Exercise     Diet Comments: loves sweets  Hep-HIV-STD-Contraception     HIV Risk: no     Dental Visit-last 6 months yes     Dental Care Counseling: not indicated; dental care within six months  Current Medications (verified): 1)  Depakote Sprinkles 125 Mg Cpsp (Divalproex Sodium) .... Take 2 Capsule in The Am and 4 Capsules Qpm Via G Tube 2)  Paxil 20 Mg Tabs (Paroxetine Hcl) .Marland Kitchen.. 1 By Mouth Qd 3)  Zyprexa 2.5 Mg Tabs (Olanzapine) .Marland Kitchen.. 1 By Mouth At Bedtime 4)  Alprazolam 0.5 Mg  Tbdp (Alprazolam) .... Take One-Half To One Tablet As Needed For Agitation/anxiety. 5)  Carafate 1 Gm/56ml  Susp (Sucralfate) .... Use 2 Tsps Through Tube When Tube Is Bleeding. 6)  Power Economist .... Dispense One [employer] manager. 7)  Dynavox .Marland Kitchen.. 343.2 8)  A Pair of Deep Lateral Supports .... As Directed Dx Cerebral Palsy 9)  Valium 5 Mg Tabs (Diazepam) .Marland Kitchen.. 1 By Mouth Two Times A Day As Needed 10)  Doxycycline Monohydrate 100 Mg Tabs (Doxycycline Monohydrate) .Marland Kitchen.. 1 By Mouth Once Daily 11)  Retin-A 0.1 % Crea (Tretinoin) .... Apply At Bedtime 12)  Albuterol Sulfate (2.5 Mg/84ml) 0.083% Nebu (Albuterol Sulfate) .Marland Kitchen.. 1 Neb Every 6 Hours As Needed For Chest  Congestion 13)  Iv Boards .... 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 Sulfa  Past History:  Past Medical History: Last updated: 02/08/2007 CP grade 3 kidney reflux Neurogenic bladder/ Trabeculated bladder G tube Bipolar disorder Neuromuscular scoliosis  Past Surgical History: Last updated: 02/08/2007 g tube s/p Baclofen pump s/p PSF T4-L4 03/1999  Family History: Last updated: 05/21/2008 Family History of Asthma Family History Breast cancer  Family History of Prostate CA  Social History: Last updated: 05/21/2008 Single Never Smoked Alcohol use-no Drug use-no Lives with parents wheelchair bound  Risk Factors: Alcohol Use: 0 (04/25/2010) Diet: loves sweets (04/25/2010)  Risk Factors: Smoking Status: never (04/25/2010)  Family History: Reviewed history from 05/21/2008 and no changes required. Family History of Asthma Family History Breast cancer  Family History of Prostate CA  Social History: Reviewed history from 05/21/2008 and no changes required. Single Never Smoked Alcohol use-no Drug use-no Lives with parents wheelchair bound Dental Care w/in 6 mos.:  yes  Review of Systems      See HPI  Physical Exam  General:  Well-developed,well-nourished,in no acute distress; alert,appropriate and cooperative throughout examination---  uses d Head:  no abnormalities observed.   Eyes:  pupils equal, pupils round,  pupils reactive to light, and no injection.   Ears:  External ear exam shows no significant lesions or deformities.  Otoscopic examination reveals clear canals, tympanic membranes are intact bilaterally without bulging, retraction, inflammation or discharge. Hearing is grossly normal bilaterally. Nose:  External nasal examination shows no deformity or inflammation. Nasal mucosa are pink and moist without lesions or exudates. Mouth:  Oral mucosa and oropharynx without lesions or exudates.  Teeth in good repair. Neck:  No  deformities, masses, or tenderness noted. Lungs:  Normal respiratory effort, chest expands symmetrically. Lungs are clear to auscultation, no crackles or wheezes. Heart:  normal rate and no murmur.   Abdomen:  g tube soft, non-tender, no distention, no masses, and no guarding.   Msk:  decreased ROM.   contractures Extremities:  no edema Neurologic:  alert & oriented X3.   + contractures pt wheelchair bound Skin:  Intact without suspicious lesions or rashes Cervical Nodes:  No lymphadenopathy noted Psych:  Oriented X3, memory intact for recent and remote, and normally interactive.     Impression & Recommendations:  Problem # 1:  PREVENTIVE HEALTH CARE (ICD-V70.0)  Orders: Venipuncture (16109) TLB-Lipid Panel (80061-LIPID) TLB-BMP (Basic Metabolic Panel-BMET) (80048-METABOL) TLB-CBC Platelet - w/Differential (85025-CBCD) TLB-Hepatic/Liver Function Pnl (80076-HEPATIC) TLB-TSH (Thyroid Stimulating Hormone) (84443-TSH) T- * Misc. Laboratory test [phone])  Problem # 2:  GENERALIZED ANXIETY DISORDER (ICD-300.02)  His updated medication list for this problem includes:    Paxil 20 Mg Tabs (Paroxetine hcl) .Marland Kitchen... 1 by mouth qd    Alprazolam 0.5 Mg Tbdp (Alprazolam) .Marland Kitchen... Take one-half to one tablet as needed for agitation/anxiety.    Valium 5 Mg Tabs (Diazepam) .Marland Kitchen... 1 by mouth two times a day as needed  Orders: Venipuncture (09811) TLB-Lipid Panel (80061-LIPID) TLB-BMP (Basic Metabolic Panel-BMET) (80048-METABOL) TLB-CBC Platelet - w/Differential (85025-CBCD) TLB-Hepatic/Liver Function Pnl (80076-HEPATIC) TLB-TSH (Thyroid Stimulating Hormone) (84443-TSH) T- * Misc. 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 Laboratory test [phone])  Complete Medication List: 1)  Depakote Sprinkles 125 Mg Cpsp (Divalproex sodium) .... Take 2 capsule in the am and 4 capsules qpm via g tube 2)  Paxil 20 Mg Tabs (Paroxetine hcl) .Marland Kitchen.. 1 by mouth qd 3)  Zyprexa 2.5 Mg Tabs (Olanzapine) .Marland Kitchen.. 1 by mouth at bedtime 4)  Alprazolam 0.5 Mg Tbdp (Alprazolam) .... Take one-half to one tablet as needed for agitation/anxiety. 5)  Carafate 1 Gm/73ml Susp (Sucralfate) .... Use 2 tsps through tube when tube is bleeding. 6)  Power Economist  .... Dispense one power wheel chair. 7)  Dynavox  .Marland Kitchen.. 343.2 8)  A Pair of Deep Lateral Supports  .... As directed dx cerebral palsy 9)  Valium 5 Mg Tabs (Diazepam) .Marland Kitchen.. 1 by mouth two times a day as needed 10)  Doxycycline Monohydrate 100 Mg Tabs (Doxycycline monohydrate) .Marland Kitchen.. 1 by mouth once daily 11)  Retin-a 0.1 % Crea (Tretinoin) .... Apply at bedtime 12)  Albuterol Sulfate (2.5 Mg/40ml) 0.083% Nebu (Albuterol sulfate) .Marland Kitchen.. 1 neb every 6 hours as needed for chest congestion 13)  Iv Boards  .... Re contractures secondary to cerebral palsy Prescriptions: IV BOARDS re contractures secondary to cerebral palsy  #12 x 0   Entered and Authorized by:   Loreen Freud DO   Signed by:   Loreen Freud DO on 04/25/2010  Method used:   Print then Give to Patient   RxID:   [id number] VALIUM 5 MG TABS (DIAZEPAM) 1 by mouth two times a day as needed  #60 x 5   Entered and Authorized by:   Loreen Freud DO   Signed by:   Loreen Freud DO on 04/25/2010   Method used:   Print then Give to Patient   RxID:   [id number] ZYPREXA 2.5 MG TABS (OLANZAPINE) 1 by mouth at bedtime  #30 Tablet x 5   Entered and Authorized by:   Loreen Freud DO   Signed by:   Loreen Freud DO on 04/25/2010   Method used:   Electronically to        [facility]  [address] [id number] (retail)       [address]       [place], [place]  [address]       Ph:  [phone] or [phone]       Fax: [phone]   RxID:   [id number] PAXIL 20 MG TABS (PAROXETINE HCL) 1 by mouth qd  #30 x 5   Entered and Authorized by:   Loreen Freud DO   Signed by:   Loreen Freud DO on 04/25/2010   Method used:   Electronically to        [facility]  [address] [id number] (retail)       [address]       [place], [place]  [address]       Ph: [phone] or [phone]       Fax: [phone]   RxID:   [id number] DEPAKOTE SPRINKLES 125 MG CPSP (DIVALPROEX SODIUM) Take 2 capsule in the am and 4 capsules qpm via g tube  #180 x 5   Entered and Authorized by:   Loreen Freud DO   Signed by:   Loreen Freud DO on 04/25/2010   Method used:   [facility]ronically to        [facility]  [address] [facility] ([facility]       [address]       [place], [place]  [address]       Ph: [phone] or [phone]       Fax: [phone]   RxID:   [id number] DOXYCYCLINE MONOHYDRATE 100 MG TABS (DOXYCYCLINE MONOHYDRATE) 1 by mouth once daily  #30 x 5   Entered and Authorized by:   Loreen Freud DO   Signed by:   Loreen Freud DO on 04/25/2010   Method used:   Electronically to        [facility]  [address] [id number] ([facility]       [address]       [place], [place]  [address]       Ph: [phone] or [phone]       Fax: [phone]   RxID:   [id number]   Appended Document: cpx//lch  Laboratory Results   Urine Tests   Date/Time Reported: April 25, 2010 12:47 PM   Routine Urinalysis   Color: lt. yellow Appearance: Clear Glucose: negative   (Normal Range: Negative) Bilirubin: negative   (Normal Range: Negative) Ketone: negative   (Normal Range: Negative) Spec. Gravity: <1.005   (Normal Range: 1.003-1.035) Blood: negative   (Normal Range: Negative) pH: 7.0   (Normal Range: 5.0-8.0) Protein: negative   (Normal Range: Negative)  Urobilinogen: negative   (Normal Range: 0-1) Nitrite: negative   (Normal Range:  Negative) Leukocyte Esterace: negative   (Normal Range: Negative)    Comments: Floydene Flock  April 25, 2010 12:48 PM

## 2010-12-02 NOTE — Miscellaneous (Signed)
Summary: RENEWAL FOR PHYSICAL THERAPY   RENEWAL FOR PHYSICAL THERAPY   Imported By: Freddy Jaksch 09/24/2007 12:29:02  _____________________________________________________________________  External Attachment:    Type:   Image     Comment:   External Document

## 2010-12-02 NOTE — Miscellaneous (Signed)
Summary: Case Conference Report/[facility]  Case Conference Report/[facility]   Imported By: Lanelle Bal 03/25/2009 11:42:08  _____________________________________________________________________  External Attachment:    Type:   Image     Comment:   External Document

## 2010-12-02 NOTE — Medication Information (Signed)
Summary: Order for Gastrostomy Care/[facility]  Order for Gastrostomy Care/[facility]   Imported By: Lanelle Bal 10/03/2008 10:16:19  _____________________________________________________________________  External Attachment:    Type:   Image     Comment:   External Document

## 2010-12-02 NOTE — Progress Notes (Signed)
 ----   Converted from flag ---- ---- 09/02/2010 8:35 PM, Hart Carwin MD wrote: Rene Kocher, could You, please, set up PEG change under Propofol for my next hospital week at [facility] and call the mother? [facility] ------------------------------  Phone Note Outgoing Call   Call placed by: Jesse Fall RN,  September 03, 2010 8:50 AM Call placed to: [facility] endo Summary of Call: [facility] endo to request PEG change with propofol on 09/18/10. No space available for this date. Please, advise Initial call taken by: Jesse Fall RN,  September 03, 2010 8:51 AM  Follow-up for Phone Call        please schedule for the first availavle spot. Follow-up by: Hart Carwin MD,  September 03, 2010 9:54 PM     Appended Document:  Scheduled patient for PEG change with Propofol at [facility] endo(Jill) on 09/18/10 @ 11:45 AM with arrival at 10:15 AM. NPO after midnight. Booking # [id number]. [name] notified.

## 2010-12-02 NOTE — Progress Notes (Signed)
Summary: RX FOR "PT"  Phone Note Call from Patient Call back at Home Phone [phone]   Caller: Mom Summary of Call: [name]'S MOTHER WANT A RX FOR HIM TO GET "PT" FROM [facility] [place] [facility] FOR HIS TRUNK,NECK AND HEAD (EVALUATION &TREATMENT) POSITIONING/STRENGTHENING BECAUSE OF THE REGRESSION IN HIM HOLDING HIS HEAD UP BECAUSE IT JUST HANGS.    "FYI"-DR COMPION @ [facility] ALSO SAID HE COULD SEE FRAKE HUNSEL @ [facility] OR JUST STAY LOCAL.    CALLER [name](MOTHER)  Initial call taken by: Vanessa [place],  July 11, 2007 4:10 PM  Follow-up for Phone Call        ok Follow-up by: Loreen Freud DO,  July 11, 2007 5:28 PM

## 2010-12-02 NOTE — Letter (Signed)
[facility]   Imported By: Freddy Jaksch 11/04/2007 10:27:42  _____________________________________________________________________  External Attachment:    Type:   Image     Comment:   INTER

## 2010-12-02 NOTE — Progress Notes (Signed)
Summary: NEED RX  Medications Added * REPAIRS TO DYNAVOX per work order       Phone Note From Other Clinic   Details for Reason: CASE MANAGER CALLED-NEED RX FOR REPAIR OF  Summary of Call: PTS CASEMANAGER CALLED---NEED RX FOR A REPAIR ON PTS DynaVox...Marland KitchenCOMPANY SAYS THEY NEED THE PRECRIPTION BEFORE THEY WILL REPAIR.Marland KitchenMarland KitchenTHE INVOICE TO REVIEW WHAT NEEDS TO BE DONE IS IN DR LOWNE'S YELLOW FOLOER.....................................................................Marland KitchenDaine Gip  October 12, 2007 4:47 PM Initial call taken by: Daine Gip,  October 12, 2007 4:47 PM  Follow-up for Phone Call        FAXED Follow-up by: Doristine Devoid,  October 13, 2007 9:30 AM    New/Updated Medications: * REPAIRS TO DYNAVOX per work order   Prescriptions: REPAIRS TO DYNAVOX per work order  #1 x 0   Entered and Authorized by:   Loreen Freud DO   Signed by:   Loreen Freud DO on 10/13/2007   Method used:   Print then Give to Patient   RxID:   [id number]

## 2010-12-02 NOTE — Consult Note (Signed)
Summary: Feeding Problem, Weight Loss, Dehydration   NAMEALAIN, [name]               ACCOUNT NO.:  [id number]   MEDICAL RECORD NO.:  [id number]          PATIENT TYPE:  INP   LOCATION:  4743                         FACILITY:  [facility]   PHYSICIAN:  Iva Boop, M.D. LHCDATE OF BIRTH:  [birth year]   DATE OF CONSULTATION:  03/04/2005  DATE OF DISCHARGE:                                   CONSULTATION   REASON FOR CONSULTATION:  Feeding problems, weight loss, dehydration.   HISTORY:  [name] is a 25 year old white male with cerebral palsy.  He  has had three episodes since November where he has been dehydrated and  required IV fluids.  His mom says he has lost about 12 pounds.  He has had  some trouble with a febrile illness with mouth sores in late April.  He was  given Magic mouthwash, was improved, but then had recurrent problems and was  not eating or drinking well.  Over the past few days, he has actually  started to drink and eat a little bit, but his urine output had been  decreased only 300 mL from 3 a.m. to 10:30 a.m., so he was admitted for IV  fluids and failure to thrive issues.  [name] apparently ate quite a bit today,  she says.  She has noted that he has had some problems sitting with his neck  down in a flexed position and she is concerned about contractures there.  She says that has progressed since he had spinal fusion a couple of years  ago.  She had considered a Port-A-Cath, as well, as a possible option.  They  do have Home Health Care.   PAST MEDICAL HISTORY:  1.  Cerebral palsy.  2.  Grade 3 reflux.  3.  Neurogenic bladder.  4.  Spinal fusion.  5.  Baclofen pump, Dr. Sharene Skeans cares for this.  6.  Anxiety.  7.  Palpitations.  8.  Hyperthyroidism.   PAST SURGICAL HISTORY:  1.  Strabismus surgery x 2.  2.  Tympanostomy tubes.  3.  Tonsillectomy and adenoidectomy.  4.  Hamstring, adductor, and heel cord release in 1989.  5.  Intrathecal Baclofen pump  implant in 1999.  6.  Spinal fusion in 2000.  7.  Hamstring release in 2000.  8.  Bilateral great toe IP fusion 2003.  9.  Baclofen pump and catheter revision April 2003.   MEDICATIONS AT HOME:  Doxycycline 100 mg daily for acne, Lexapro 10 mg  daily, and Retin-A.   MEDICATIONS IN THE HOSPITAL:  Baclofen pump.   ALLERGIES:  Sulfa.   SOCIAL HISTORY:  He lives at home with his parents, he is to graduate from  the [employer] on June 1.  No tobacco, alcohol, or drugs.   FAMILY HISTORY:  Asthma in his mother, breast cancer in grandmother and  aunt, prostate cancer in his grandfather and great uncles.   REVIEW OF SYMPTOMS:  Mental status is improved over the last 24 hours since  IV fluids.  He had been lethargic, not talking, not eating well, though  again that was starting to improve.  Urine output was off.  He has lost 12  pounds.  No other obvious problems at this time, though it is difficult to  get a complete review of systems given his status.  He does have wakefulness  at night, he has a chronic indwelling Foley catheter.   PHYSICAL EXAMINATION:  GENERAL:  Chronically ill young white male who is awake.  VITAL SIGNS:  Temperature 97.9, pulse 85, respirations 20, blood pressure  117/66.  HEENT:  Eyes are anicteric.  Mouth is free of obvious lesions, it was open  most of the time.  NECK:  Contracted forward slightly into the chest while he lies in the bed.  LUNGS:  Clear anteriorly, poor effort.  HEART:  S1 and S2, no murmurs, gallops, and rubs.  ABDOMEN:  Soft, Baclofen pump is in the right lower side, there were no  scars, the left upper quadrant is soft, nontender, no masses.  EXTREMITIES:  No edema.  MUSCULOSKELETAL/NEUROLOGICAL:  Multiple contractures of the upper and lower  extremities, generalized muscle wasting.   LABORATORY DATA:  Hemoglobin 15, hematocrit 45, white count 9.7.  BMP  normal.  LFTs normal.  TSH 0.345.   ASSESSMENT:  Feeding problems and weight  loss with recurrent dehydration  issues.  This in the setting of cerebral palsy.   RECOMMENDATIONS AND PLAN:  Percutaneous endoscopic gastrostomy is a  reasonable option to prevent hospitalizations in this patient.  I have  explained the risks, benefits, indications that include bleeding, infection,  medication reaction, possibility of surgery for complications.  Eventually,  it could be changed to a button type which is a lower profile tube.   His mom is going to think about this some more.  She seems inclined to do  it, but wants to talk to her husband some more.  Further plans pending that.  We might be able to do this in the next couple of days.  They have Home  Health care to help them with this.   I appreciate the opportunity to care for this patient.   ADDENDUM 03/08/05   Attempted PEG unsuccessful. He developed tachycardia and muscular  contraction of body that seemed voluntary. Once I told him that we were not  going to place the PEG he relaxed. If he and family decide to pursue PEG in  future, I would ask for general anesthesia and have a surgeon do it (in case  endoscopic route failed).      CEG/MEDQ  D:  03/04/2005  T:  03/04/2005  Job:  [id number]   cc:   Loreen Freud, M.D.

## 2010-12-02 NOTE — Letter (Signed)
Summary: [facility]   Imported By: Lanelle Bal 01/09/2009 09:12:43  _____________________________________________________________________  External Attachment:    Type:   Image     Comment:   External Document

## 2010-12-02 NOTE — Letter (Signed)
Summary: Follow-Up Form/[facility]  Follow-Up Form/[facility]   Imported By: Lanelle Bal 08/20/2009 11:37:56  _____________________________________________________________________  External Attachment:    Type:   Image     Comment:   External Document

## 2010-12-02 NOTE — Assessment & Plan Note (Signed)
Summary: clearance for sx.cbs   Vital Signs:  Patient Profile:   25 Years Old Male Weight:      115 pounds Temp:     98.7 degrees F oral Pulse rate:   68 / minute Resp:     16 per minute BP sitting:   120 / 80  (left arm)  Pt. in pain?   no  Vitals Entered By: Ardyth Man (May 21, 2008 12:55 PM)                  Referred by:  Dr Elvina Sidle-- Kateri Mc PCP:  Laury Axon  Chief Complaint:  surgery clearance, weight is approximate per mother., and Pre-op Evaluation.  History of Present Illness:  Pre-Op Evaluation      I was asked to see this delightful patient today for Pre-op Evaluation.  Pt is a 72 yo here with mom for surgical clearance for fusion of C4- T8 .  No complaints.  Pt with hx CP and doing well.  The patient presents with heavy ETOH use, but denies respiratory symptoms, GI bleeding, chest pain, edema, PND, and smoking.  Patient has no history of acute or recent MI, unstable or severe angina, decompensated CHF, high grade AV block, symptomatic ventricular arrhythmia, and severe valvular disease.  Patient has no history of mild angina(m), previous MI(m), compensated CHF(m), diabetes(m), renal insufficiency(m), advanced age(l), abnormal ECG(l), rhythm other than sinus(l), low functional capacity(l), stroke history(l), and uncontrolled HTN(l).  There is no history of antiplatelet agents, chronic steroids, warfarin, diabetes meds, antianginal meds, bleeding disorder, and FH anesthesia reaction.      Current Allergies: ! SULFA  Past Medical History:    Reviewed history from 02/08/2007 and no changes required:       CP       grade 3 kidney reflux       Neurogenic bladder/ Trabeculated bladder       G tube       Bipolar disorder       Neuromuscular scoliosis  Past Surgical History:    Reviewed history from 02/08/2007 and no changes required:       g tube       s/p Baclofen pump       s/p PSF T4-L4 03/1999   Family History:    Reviewed history and no changes required:  Family History of Asthma       Family History Breast cancer        Family History of Prostate CA         Social History:    Reviewed history and no changes required:       Single       Never Smoked       Alcohol use-no       Drug use-no       Lives with parents       wheelchair bound   Risk Factors:  Tobacco use:  never Drug use:  no HIV high-risk behavior:  no Alcohol use:  no  Family History Risk Factors:    Family History of MI in females < 47 years old:  no    Family History of MI in males < 50 years old:  no   Review of Systems      See HPI  General      Denies chills, fatigue, fever, loss of appetite, malaise, sleep disorder, sweats, weakness, and weight loss.  Eyes      Denies blurring, discharge, double vision, eye irritation, eye  pain, halos, itching, light sensitivity, red eye, vision loss-1 eye, and vision loss-both eyes.  ENT      Denies decreased hearing, difficulty swallowing, ear discharge, earache, hoarseness, nasal congestion, nosebleeds, postnasal drainage, ringing in ears, sinus pressure, and sore throat.  CV      Denies bluish discoloration of lips or nails, chest pain or discomfort, difficulty breathing at night, difficulty breathing while lying down, fainting, fatigue, leg cramps with exertion, lightheadness, near fainting, palpitations, shortness of breath with exertion, swelling of feet, swelling of hands, and weight gain.  Resp      Denies chest discomfort, chest pain with inspiration, cough, coughing up blood, excessive snoring, hypersomnolence, morning headaches, pleuritic, shortness of breath, sputum productive, and wheezing.  GI      Denies abdominal pain, bloody stools, change in bowel habits, constipation, dark tarry stools, diarrhea, excessive appetite, gas, hemorrhoids, indigestion, loss of appetite, nausea, vomiting, vomiting blood, and yellowish skin color.      G tube  GU      Complains of incontinence.      Denies decreased  libido, discharge, dysuria, erectile dysfunction, genital sores, hematuria, nocturia, urinary frequency, and urinary hesitancy.      [place] catheter  MS      Pt with hx Cp,  + scoliosis  Derm      Denies changes in color of skin, changes in nail beds, dryness, excessive perspiration, flushing, hair loss, insect bite(s), itching, lesion(s), poor wound healing, and rash.  Neuro      Complains of inability to speak.      Pt speaks with Dyna vox  Psych      Denies alternate hallucination ( auditory/visual), anxiety, depression, easily angered, easily tearful, irritability, mental problems, panic attacks, sense of great danger, suicidal thoughts/plans, thoughts of violence, unusual visions or sounds, and thoughts /plans of harming others.      stable with meds  Endo      Denies cold intolerance, excessive hunger, excessive thirst, excessive urination, heat intolerance, polyuria, and weight change.  Heme      See HPI      Denies abnormal bruising, bleeding, enlarge lymph nodes, fevers, pallor, and skin discoloration.  Allergy      Denies hives or rash, itching eyes, persistent infections, seasonal allergies, and sneezing.   Physical Exam  General:     Well-developed,well-nourished,in no acute distress; alert,appropriate and cooperative throughout examination Head:     Normocephalic and atraumatic without obvious abnormalities. No apparent alopecia or balding. Eyes:     pupils equal, pupils round, and pupils reactive to light.   Ears:     External ear exam shows no significant lesions or deformities.  Otoscopic examination reveals clear canals, tympanic membranes are intact bilaterally without bulging, retraction, inflammation or discharge. Hearing is grossly normal bilaterally. Nose:     External nasal examination shows no deformity or inflammation. Nasal mucosa are pink and moist without lesions or exudates. Mouth:     good dentition and pharynx pink and moist.   Neck:     No  deformities, masses, or tenderness noted. Lungs:     Normal respiratory effort, chest expands symmetrically. Lungs are clear to auscultation, no crackles or wheezes. Heart:     Normal rate and regular rhythm. S1 and S2 normal without gallop, murmur, click, rub or other extra sounds. Abdomen:     Bowel sounds positive,abdomen soft and non-tender without masses, organomegaly or hernias noted.   + g tube Genitalia:     [place]  catheter Msk:     + scoliosis Extremities:     No edema + contracions Neurologic:     alert & oriented X3.    Skin:     Intact without suspicious lesions or rashes Cervical Nodes:     No lymphadenopathy noted Psych:     normally interactive.      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 Take 1 capsule twice a day 2)  Paxil 30 Mg Tabs (Paroxetine hcl) 3)  Repairs To Dynavox  .... Per work order repairs are less expensive than buying new machine 4)  Zyprexa 5 Mg Tabs (Olanzapine) .... Take one tablet each evening. 5)  Propranolol Hcl 10 Mg Tabs (Propranolol hcl) .... Tak eone tablet each day in the evening. 6)  Alprazolam 0.5 Mg Tbdp (Alprazolam) .... Take one-half to one tablet as needed for agitation/anxiety. 7)  Carafate 1 Gm/63ml Susp (Sucralfate) .... Use 2 tsps through tube when tube is bleeding. 8)  Baclofen Pump  .Marland Kitchen.. 700 mcg daily.    ]  EKG  Procedure date:  05/21/2008  Findings:      sinus rhythm 87 bpm   EKG  Procedure date:  05/21/2008  Findings:      sinus rhythm 87 bpm

## 2010-12-02 NOTE — Assessment & Plan Note (Signed)
Summary: flu shot//tl   Nurse Visit    Prior Medications: ACIPHEX 20 MG  TBEC (RABEPRAZOLE SODIUM) 1 twice daily DEPAKOTE SPRINKLES 125 MG CPSP (DIVALPROEX SODIUM) Take 1 capsule twice a day PAXIL 30 MG TABS (PAROXETINE HCL)     Influenza Vaccine    Vaccine Type: Fluvax Non-MCR    Site: left deltoid    Dose: 0.5 ml    Route: IM    Given by: Chrae Malloy    Exp. Date: 05/01/2008    Lot #: [id number]    VIS given: 05/26/07 version given October 11, 2007.   Orders Added: 1)  Influenza Vaccine NON MCR [00028]    ]

## 2010-12-02 NOTE — Procedures (Signed)
Summary: Gastroenterology-EGD and Peg Placement  Gastroenterology-EGD and Peg Placement   Imported By: Harlow Mares CMA (AAMA) 08/01/2010 17:38:31  _____________________________________________________________________  External Attachment:    Type:   Image     Comment:   External Document

## 2010-12-02 NOTE — Miscellaneous (Signed)
[facility]   Imported By: Freddy Jaksch 11/01/2007 14:54:24  _____________________________________________________________________  External Attachment:    Type:   Image     Comment:   External Document

## 2010-12-02 NOTE — Letter (Signed)
Summary: [facility]  [facility]   Imported By: Lanelle Bal 05/16/2010 11:27:24  _____________________________________________________________________  External Attachment:    Type:   Image     Comment:   External Document

## 2010-12-02 NOTE — Progress Notes (Signed)
Summary: Referral for Hospital bed  Phone Note Call from Patient   Caller: Mom Details for Reason: Referral  Summary of Call: Spk with Bonita Quin who advised pt is unable to get his hospital bed through [facility] due to having [employer], [place] [facility] can take care of it. I called [facility] and Faxed the orders for a Hospital Bed with an air mattress.  Initial call taken by: Almeta Monas CMA (AAMA),  July 16, 2010 10:00 AM

## 2010-12-02 NOTE — Miscellaneous (Signed)
Summary: Plan of Care/[facility]  Plan of Care/[facility]   Imported By: Lanelle Bal 03/25/2009 11:40:52  _____________________________________________________________________  External Attachment:    Type:   Image     Comment:   External Document

## 2010-12-02 NOTE — Progress Notes (Signed)
Summary: Physician List from [facility] Brought by Patient  Physician List from [facility] Brought by Patient   Imported By: Lanelle Bal 07/23/2010 09:42:06  _____________________________________________________________________  External Attachment:    Type:   Image     Comment:   External Document

## 2010-12-02 NOTE — Letter (Signed)
Summary: powerchair  powerchair   Imported By: Freddy Jaksch 02/28/2008 08:32:06  _____________________________________________________________________  External Attachment:    Type:   Image     Comment:   External Document

## 2010-12-02 NOTE — Miscellaneous (Signed)
Summary: Care Plan/[facility]  Care Plan/[facility]   Imported By: Lanelle Bal 08/25/2010 12:32:08  _____________________________________________________________________  External Attachment:    Type:   Image     Comment:   External Document

## 2010-12-02 NOTE — Miscellaneous (Signed)
Summary: Face to Face Encounter/[facility]  Face to Face Encounter/[facility]   Imported By: Lanelle Bal 08/19/2010 14:40:54  _____________________________________________________________________  External Attachment:    Type:   Image     Comment:   External Document

## 2010-12-02 NOTE — Procedures (Signed)
Summary: Endo Prep/[facility]  Endo Prep/[facility]   Imported By: Lester [place] 08/05/2010 09:41:37  _____________________________________________________________________  External Attachment:    Type:   Image     Comment:   External Document

## 2010-12-02 NOTE — Miscellaneous (Signed)
Summary: Plan of Care/[facility]  Plan of Care/[facility]   Imported By: Lanelle Bal 01/30/2009 09:18:18  _____________________________________________________________________  External Attachment:    Type:   Image     Comment:   External Document

## 2010-12-02 NOTE — Miscellaneous (Signed)
Summary: Rehab Report  Rehab Report   Imported By: Freddy Jaksch 09/01/2007 09:49:32  _____________________________________________________________________  External Attachment:    Type:   Image     Comment:   INTERNAL

## 2010-12-02 NOTE — Assessment & Plan Note (Signed)
Summary: FILL OUT FORM

## 2010-12-02 NOTE — Miscellaneous (Signed)
Summary: [facility]   [facility]   Imported By: Lanelle Bal 08/01/2009 09:05:27  _____________________________________________________________________  External Attachment:    Type:   Image     Comment:   External Document

## 2010-12-02 NOTE — Miscellaneous (Signed)
Summary: Home Care Report  Home Care Report   Imported By: Job Founds 05/30/2007 15:40:14  _____________________________________________________________________  External Attachment:    Type:   Image     Comment:   External Document

## 2010-12-02 NOTE — Miscellaneous (Signed)
Summary: Order Confirmation for Bed/[facility]  Order Confirmation for Bed/[facility]   Imported By: Lanelle Bal 07/28/2010 08:29:23  _____________________________________________________________________  External Attachment:    Type:   Image     Comment:   External Document

## 2010-12-02 NOTE — Assessment & Plan Note (Signed)
Summary: fill out form for wheel chair/cbs   Vital Signs:  Patient Profile:   25 Years Old Male Pulse rate:   60 / minute Resp:     18 per minute BP sitting:   120 / 80  (left arm)  Pt. in pain?   no  Vitals Entered By: Jeremy Johann CMA (June 26, 2008 4:11 PM)                  Referred by:  Dr Elvina Sidle-- [facility] PCP:  Laury Axon  Chief Complaint:  FILL OUT FORMS.  History of Present Illness: Pt here with mom to have forms filled out for new wheelchair.  No complaints.    Current Allergies (reviewed today): ! SULFA  Past Medical History:    Reviewed history from 02/08/2007 and no changes required:       CP       grade 3 kidney reflux       Neurogenic bladder/ Trabeculated bladder       G tube       Bipolar disorder       Neuromuscular scoliosis   Family History:    Reviewed history from 05/21/2008 and no changes required:       Family History of Asthma       Family History Breast cancer        Family History of Prostate CA         Social History:    Reviewed history from 05/21/2008 and no changes required:       Single       Never Smoked       Alcohol use-no       Drug use-no       Lives with parents       wheelchair bound   Risk Factors: Tobacco use:  never Drug use:  no HIV high-risk behavior:  no Alcohol use:  no  Family History Risk Factors:    Family History of MI in females < 27 years old:  no    Family History of MI in males < 81 years old:  no   Review of Systems      See HPI   Physical Exam  General:     alert, well-nourished, and well-hydrated.   Skin:     no decubiti Psych:     Oriented X3, memory intact for recent and remote, normally interactive, not anxious appearing, and not depressed appearing.      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 Take 1 capsule twice a day 2)  Paxil 30 Mg Tabs (Paroxetine hcl) 3)  Repairs To Dynavox  .... Per work order repairs are less expensive than buying new machine 4)  Zyprexa 5 Mg Tabs (Olanzapine) .... Take one tablet each evening. 5)  Propranolol Hcl 10 Mg Tabs (Propranolol hcl) .... Tak eone tablet each day in the evening. 6)  Alprazolam 0.5 Mg Tbdp (Alprazolam) .... Take one-half to one tablet as needed for agitation/anxiety. 7)  Carafate 1 Gm/49ml Susp (Sucralfate) .... Use 2 tsps through tube when tube is bleeding. 8)  Baclofen Pump  .Marland Kitchen.. 700 mcg daily.    ]

## 2010-12-02 NOTE — Miscellaneous (Signed)
Summary: [facility]  [facility]   Imported By: Lanelle Bal 06/05/2008 10:05:09  _____________________________________________________________________  External Attachment:    Type:   Image     Comment:   External Document

## 2010-12-02 NOTE — Letter (Signed)
Summary: [facility]  [facility]   Imported By: Freddy Jaksch 10/13/2007 11:04:38  _____________________________________________________________________  External Attachment:    Type:   Image     Comment:   External Document

## 2010-12-02 NOTE — Progress Notes (Signed)
Summary: [facility] 02/26/09  Phone Note Outgoing Call Call back at Home Phone [phone]   Call placed by: Ardyth Man,  February 26, 2009 12:00 PM Call placed to: Patient Summary of Call: Left message for mother to call the office normal xray. Ardyth Man  February 26, 2009 12:00 PM   Follow-up for Phone Call        Patient mother aware Ardyth Man  February 26, 2009 1:38 PM  Follow-up by: Ardyth Man,  February 26, 2009 1:39 PM

## 2010-12-02 NOTE — Assessment & Plan Note (Signed)
Summary: raised red spots around his eyes/alj  Medications Added DEPAKOTE SPRINKLES 125 MG CPSP (DIVALPROEX SODIUM) Take 1 capsule twice a day PAXIL 30 MG TABS (PAROXETINE HCL)         Vital Signs:  Patient Profile:   25 Years Old Male Temp:     99.1 degrees F oral Pulse rate:   68 / minute Resp:     18 per minute BP sitting:   100 / 70  (left arm)  Pt. in pain?   no  Vitals Entered By: Ardyth Man (April 29, 2007 8:59 AM)                Chief Complaint:  Rash on left corner of skin around eye.        Physical Exam  General:     Well-developed,well-nourished,in no acute distress; alert,appropriate and cooperative throughout examination Skin:     around L eye---  papular rash,papular rash.      Impression & Recommendations:  Problem # 1:  FACIAL RASH (ICD-782.1) Assessment: New around L eye--  OTC cortisone F/u ophto if no better  Problem # 2:  AFTERCARE, LONG-TERM USE, MEDICATIONS NEC (ICD-V58.69) Fax labs to psych Orders: Venipuncture (47829) TLB-CBC Platelet - w/Differential (85025-CBCD) TLB-Lipid Panel (80061-LIPID) TLB-A1C / Hgb A1C (Glycohemoglobin) (83036-A1C) T- * Misc. Laboratory test [phone]) TLB-BMP (Basic Metabolic Panel-BMET) (80048-METABOL)   Medications Added to Medication List This Visit: 1)  Depakote Sprinkles 125 Mg Cpsp (Divalproex sodium) .... Take 1 capsule twice a day 2)  Paxil 30 Mg Tabs (Paroxetine hcl)  Appended Document: raised red spots around his eyes/alj      PCP:  [place]   History of Present Illness: Pt here with mom and worker.  C/o rash around L eye x several weeks.  No otc.  no other complaint.  They need labs done today to for psych.

## 2010-12-02 NOTE — Consult Note (Signed)
Summary: [facility]  [facility]   Imported By: Lanelle Bal 01/06/2010 12:30:33  _____________________________________________________________________  External Attachment:    Type:   Image     Comment:   External Document

## 2010-12-02 NOTE — Progress Notes (Signed)
Summary: xray results  Phone Note Call from Patient Call back at cell [phone]   Caller: Mom Summary of Call: pt mom called for xray results, mom has been informed , also per dr Drue Novel if no better ortho referral, which mom says she will wait till next week to see how he does still have some swelling  but keeping foot elevated. Initial call taken by: Kandice Hams,  November 07, 2009 3:00 PM

## 2010-12-02 NOTE — Miscellaneous (Signed)
Summary: Orders Update   Clinical Lists Changes  Orders: Added new Referral order of Physical Therapy Referral (PT) - Signed 

## 2010-12-02 NOTE — Assessment & Plan Note (Signed)
   Vital Signs:  Patient Profile:   25 Years Old Male Pulse rate:   80 / minute Resp:     20 per minute BP sitting:   100 / 70  (left arm)  Vitals Entered By: Shonna Chock (February 08, 2007 1:54 PM)               PCP:  Laury Axon  Chief Complaint:  PT SCHDULED FOR MED CHECK.Marland Kitchen  History of Present Illness: Pt is here with mom to F/U on ER visit.  Pt doing much better.  Pt had coffee ground material from g-tube. He was brought to the ER and work up was negative.  Dr Juanda Chance was consulted and Pt was put on ACiphex and Carafate.  Reglan was also RX but they were told by CP specialist at [facility] not to use Reglan because of mood swings and irritability.     Past Medical History:    CP    grade 3 kidney reflux    Neurogenic bladder/ Trabeculated bladder    G tube    Bipolar disorder    Neuromuscular scoliosis  Past Surgical History:    g tube    s/p Baclofen pump    s/p PSF T4-L4 03/1999     Review of Systems  General      Denies chills, fatigue, fever, loss of appetite, malaise, sleep disorder, sweats, weakness, and weight loss.  CV      Denies bluish discoloration of lips or nails, chest pain or discomfort, difficulty breathing at night, difficulty breathing while lying down, fainting, fatigue, leg cramps with exertion, lightheadness, near fainting, palpitations, shortness of breath with exertion, swelling of feet, swelling of hands, and weight gain.  Resp      Denies chest discomfort, chest pain with inspiration, cough, coughing up blood, excessive snoring, hypersomnolence, morning headaches, pleuritic, shortness of breath, sputum productive, and wheezing.  GI      Denies abdominal pain, bloody stools, change in bowel habits, constipation, dark tarry stools, diarrhea, excessive appetite, gas, hemorrhoids, indigestion, loss of appetite, nausea, vomiting, vomiting blood, and yellowish skin color.   Physical Exam  General:     Well-developed,well-nourished,in no acute distress;  alert,appropriate and cooperative throughout examination Mouth:     Oral mucosa and oropharynx without lesions or exudates.  Teeth in good repair. Abdomen:     soft, non-tender, normal bowel sounds, no distention, no masses, no guarding, and no abdominal hernia.  g-tube in place    Impression & Recommendations:  Problem # 1:  ESOPHAGITIS, REFLUX (ICD-530.11) Assessment: Deteriorated Pt changed from Omeprazole to Aciphex by Dr Juanda Chance Appt.  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  Mom will call with name of PT in [place] that specializes in spinal cord disorders.  Appended Document:  The pt also c/o of Thickend toenail on R. foot.  Mom is requesting a referral for podiatry.  We tried [facility] in past with no results.

## 2010-12-02 NOTE — Progress Notes (Signed)
Summary: prior auth APPROVED tretinoin  prescription solution  Phone Note From Pharmacy   Caller: [facility] [address] Summary of Call: prior auth [phone]  PRIOR AUTH APPROVED 10-23-09 UNTIL 10-23-10, PHARMACY FAXED...................Marland KitchenFelecia Deloach CMA  October 23, 2009 8:34 AM  Initial call taken by: Jeremy Johann CMA,  October 23, 2009 8:34 AM

## 2010-12-02 NOTE — Progress Notes (Signed)
  Phone Note Call from Patient   Caller: Mother-linda Korus Call For: [facility] Summary of Call: Pt was weaned off baclofen pump and had spasms.  He was jerking his legs a lot and c/o b/l hip pain and mother is concerned about hip dislocations--he has done this before. Initial call taken by: Loreen Freud DO,  November 06, 2008 1:41 PM  Follow-up for Phone Call        check xrays and refer to ortho as needed -- mother aware and will take [name] for xrays Follow-up by: Loreen Freud DO,  November 06, 2008 1:41 PM  New Problems: HIP PAIN, BILATERAL (ICD-719.45)   New Problems: HIP PAIN, BILATERAL (ICD-719.45)

## 2010-12-02 NOTE — Miscellaneous (Signed)
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

## 2010-12-02 NOTE — Progress Notes (Signed)
Summary: rx dynavox - dr Laury Axon  Phone Note Other Incoming   Summary of Call: Olegario Messier bargar case manager - needs rx for dynavox / communication divice - her # office [phone] ext [phone] --- cell [phone] --- fax [phone] ---info from dynavox was backed up in computer the computer crashed - all the info was lost  -- patient needs this to communicate Initial call taken by: Okey Regal [place],  August 02, 2008 9:07 AM  Follow-up for Phone Call        rx printed out Follow-up by: Loreen Freud DO,  August 02, 2008 12:13 PM  Additional Follow-up for Phone Call Additional follow up Details #1::        spoke with pt and rx fax  Additional Follow-up by: [facility] CMA,  August 02, 2008 2:56 PM    New/Updated Medications: * DYNAVOX 343.2   Prescriptions: DYNAVOX 343.2  #1 x 0   Entered and Authorized by:   Loreen Freud DO   Signed by:   Loreen Freud DO on 08/02/2008   Method used:   Printed then faxed to ...         RxID:   [id number]

## 2010-12-02 NOTE — Miscellaneous (Signed)
Summary: Plan of Treatment/[facility]  Plan of Treatment/[facility]   Imported By: Lanelle Bal 10/03/2008 08:53:35  _____________________________________________________________________  External Attachment:    Type:   Image     Comment:   External Document

## 2010-12-02 NOTE — Assessment & Plan Note (Signed)
Summary: QUARTLY REVIEW OF MEDICATIONS////SPH   Vital Signs:  Patient profile:   25 year old male Temp:     98.1 degrees F oral Pulse rate:   86 / minute Pulse rhythm:   regular BP sitting:   112 / 80  (left arm) Cuff size:   regular  Vitals Entered By: Army Fossa CMA (October 16, 2009 10:55 AM) CC: Refill on meds   History of Present Illness: Pt here with mom for med refill.  Pt has been doing great since off baclofen.  No complaints.    Current Medications (verified): 1)  Depakote Sprinkles 125 Mg Cpsp (Divalproex Sodium) .... Take 2 Capsule in Am and 4 By Mouth Qpm 2)  Paxil 20 Mg Tabs (Paroxetine Hcl) .Marland Kitchen.. 1 By Mouth Qd 3)  Repairs To Dynavox .... Per Work Buyer, retail Are Less Expensive Than Buying New Machine 4)  Zyprexa 2.5 Mg Tabs (Olanzapine) .Marland Kitchen.. 1 By Mouth At Bedtime 5)  Alprazolam 0.5 Mg  Tbdp (Alprazolam) .... Take One-Half To One Tablet As Needed For Agitation/anxiety. 6)  Carafate 1 Gm/23ml  Susp (Sucralfate) .... Use 2 Tsps Through Tube When Tube Is Bleeding. 7)  Power Economist .... Dispense One Heritage manager. 8)  Dynavox .Marland Kitchen.. 343.2 9)  Atenolol 25 Mg Tabs (Atenolol) .Marland Kitchen.. 1 By Mouth Two Times A Day As Needed 10)  A Pair of Deep Lateral Supports .... As Directed Dx Cerebral Palsy 11)  Valium 5 Mg Tabs (Diazepam) .Marland Kitchen.. 1 By Mouth Two Times A Day As Needed 12)  Doxycycline Monohydrate 100 Mg Tabs (Doxycycline Monohydrate) .Marland Kitchen.. 1 By Mouth Two Times A Day 13)  Retin-A 0.1 % Crea (Tretinoin) .... Apply At Bedtime  Allergies: 1)  ! Sulfa  Past History:  Past Medical History: Last updated: 02/08/2007 CP grade 3 kidney reflux Neurogenic bladder/ Trabeculated bladder G tube Bipolar disorder Neuromuscular scoliosis  Past Surgical History: Last updated: 02/08/2007 g tube s/p Baclofen pump s/p PSF T4-L4 03/1999  Family History: Last updated: 05/21/2008 Family History of Asthma Family History Breast cancer  Family History of Prostate  CA  Social History: Last updated: 05/21/2008 Single Never Smoked Alcohol use-no Drug use-no Lives with parents wheelchair bound  Risk Factors: Smoking Status: never (05/21/2008)  Family History: Reviewed history from 05/21/2008 and no changes required. Family History of Asthma Family History Breast cancer  Family History of Prostate CA  Social History: Reviewed history from 05/21/2008 and no changes required. Single Never Smoked Alcohol use-no Drug use-no Lives with parents wheelchair bound  Review of Systems      See HPI  Physical Exam  General:  Well-developed,well-nourished,in no acute distress; alert,appropriate and cooperative throughout examination--- Neck:  No deformities, masses, or tenderness noted. Lungs:  Normal respiratory effort, chest expands symmetrically. Lungs are clear to auscultation, no crackles or wheezes. Heart:  normal rate and no murmur.   Msk:  no change Neurologic:  no change Skin:  + acne on face Psych:  happy , talkative   Impression & Recommendations:  Problem # 1:  GENERALIZED ANXIETY DISORDER (ICD-300.02)  His updated medication list for this problem includes:    Paxil 20 Mg Tabs (Paroxetine hcl) .Marland Kitchen... 1 by mouth qd    Alprazolam 0.5 Mg Tbdp (Alprazolam) .Marland Kitchen... Take one-half to one tablet as needed for agitation/anxiety.    Valium 5 Mg Tabs (Diazepam) .Marland Kitchen... 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 1 by mouth two times a day    Retin-a 0.1 % Crea (Tretinoin) .Marland Kitchen... Apply at bedtime  Discussed care of the skin and different treatment options.   Complete Medication List: 1)  Depakote Sprinkles 125 Mg Cpsp (Divalproex sodium) .... Take 2 capsule in am and  4 by mouth qpm 2)  Paxil 20 Mg Tabs (Paroxetine hcl) .Marland Kitchen.. 1 by mouth qd 3)  Repairs To Dynavox  .... Per work order repairs are less expensive than buying new machine 4)  Zyprexa 2.5 Mg Tabs (Olanzapine) .Marland Kitchen.. 1 by mouth at bedtime 5)  Alprazolam 0.5 Mg Tbdp (Alprazolam) .... Take one-half to one tablet as needed for agitation/anxiety. 6)  Carafate 1 Gm/67ml Susp (Sucralfate) .... Use 2 tsps through tube when tube is bleeding. 7)  Power Economist  .... Dispense one power wheel chair. 8)  Dynavox  .Marland Kitchen.. 343.2 9)  Atenolol 25 Mg Tabs (Atenolol) .Marland Kitchen.. 1 by mouth two times a day as needed 10)  A Pair of Deep Lateral Supports  .... As directed dx cerebral palsy 11)  Valium 5 Mg Tabs (Diazepam) .Marland Kitchen.. 1 by mouth two times a day as needed 12)  Doxycycline Monohydrate 100 Mg Tabs (Doxycycline monohydrate) .Marland Kitchen.. 1 by mouth two times a day 13)  Retin-a 0.1 % Crea (Tretinoin) .... Apply at bedtime Prescriptions: RETIN-A 0.1 % CREA (TRETINOIN) apply at bedtime  #30g x 2   Entered and Authorized by:   Loreen Freud DO   Signed by:   Loreen Freud DO on 10/16/2009   Method used:   Print then Give to Patient   RxID:   [id number] DOXYCYCLINE MONOHYDRATE 100 MG TABS (DOXYCYCLINE MONOHYDRATE) 1 by mouth two times a day  #60 x 2   Entered and Authorized by:   Loreen Freud DO   Signed by:   Loreen Freud DO on 10/16/2009   Method used:   Print then Give to Patient   RxID:   [id number] PAXIL 20 MG TABS (PAROXETINE HCL) 1 by mouth qd  #30 x 5   Entered and Authorized by:   Loreen Freud DO   Signed by:   Loreen Freud DO on 10/16/2009   Method used:   Print then Give to Patient   RxID:   [id number] ZYPREXA 2.5 MG TABS (OLANZAPINE) 1 by mouth at bedtime  #30 x 5   Entered and Authorized by:   Loreen Freud DO   Signed by:   Loreen Freud DO on 10/16/2009   Method used:   Print then Give to Patient   RxID:   [id number] VALIUM 5 MG TABS (DIAZEPAM) 1 by mouth two times a day as needed  #60 x 5    Entered and Authorized by:   Loreen Freud DO   Signed by:   Loreen Freud DO on 10/16/2009   Method used:   Print then Give to Patient   RxID:   [id number] DEPAKOTE SPRINKLES 125 MG CPSP (DIVALPROEX SODIUM) Take 2 capsule in am and 4 by mouth qpm Brand medically necessary #180 x 5   Entered and Authorized by:   Loreen Freud DO   Signed by:   Loreen Freud DO on 10/16/2009   Method used:   Print then Give to Patient   RxID:   [id number]

## 2010-12-02 NOTE — Medication Information (Signed)
Summary: Order for Gastrostomy Care Supplies/[facility]  Order for Gastrostomy Care Supplies/[facility]   Imported By: Lanelle Bal 01/07/2009 08:32:22  _____________________________________________________________________  External Attachment:    Type:   Image     Comment:   External Document

## 2010-12-02 NOTE — Assessment & Plan Note (Signed)
Summary: ACUTE - CHOKING,CONGESTED/[facility]   Vital Signs:  Patient profile:   25 year old male Weight:      115 pounds Pulse rate:   88 / minute Pulse rhythm:   regular Resp:     22 per minute BP sitting:   100 / 74  (left arm) Cuff size:   regular  Vitals Entered By: Ardyth Man (February 25, 2009 3:48 PM) Comments Pt is experiencing "episodes" ; pt is choking on foods easily and getting upset after incident occurs. Mother notices that his heartrate is very rapid during "episodes" and pt is very difficult to calm down.     History of Present Illness: Pt here with mom c/o palpatations again with anxiety after his grandmother accused mom of stealing money and pt became very upset.  The grandmother is paranoid.  Pt had hx of tachy before and had been on atenolol but has been off of it for years because anxiety has been controlled.   Pt has also had increased trouble with swallowing and choking with eating and mom is concerned about aspiration.  No fevers and no cough since here today.  Current Medications (verified): 1)  Depakote Sprinkles 125 Mg Cpsp (Divalproex Sodium) .... Take 1 Capsule Twice A Day 2)  Paxil 30 Mg Tabs (Paroxetine Hcl) 3)  Repairs To Dynavox .... Per Work Buyer, retail Are Less Expensive Than Buying New Machine 4)  Zyprexa 5 Mg  Tabs (Olanzapine) .... Take One Tablet Each Evening. 5)  Propranolol Hcl 10 Mg  Tabs (Propranolol Hcl) .... Cindra Eves Tablet Each Day in The Evening. 6)  Alprazolam 0.5 Mg  Tbdp (Alprazolam) .... Take One-Half To One Tablet As Needed For Agitation/anxiety. 7)  Carafate 1 Gm/67ml  Susp (Sucralfate) .... Use 2 Tsps Through Tube When Tube Is Bleeding. 8)  Baclofen Pump .Marland KitchenMarland KitchenMarland Kitchen 140 Mcg Daily. 9)  Power Economist .... Dispense One Heritage manager. 10)  Dynavox .Marland Kitchen.. 343.2 11)  Doxycycline Hyclate 100 Mg Tabs (Doxycycline Hyclate) .Marland Kitchen.. 1 By Mouth Two Times A Day 12)  Atenolol 25 Mg Tabs (Atenolol) .Marland Kitchen.. 1 By Mouth Two Times A Day As  Needed  Allergies (verified): 1)  ! Sulfa  Past History:  Past medical, surgical, family and social histories (including risk factors) reviewed, and no changes noted (except as noted below).  Past Medical History:    Reviewed history from 02/08/2007 and no changes required:    CP    grade 3 kidney reflux    Neurogenic bladder/ Trabeculated bladder    G tube    Bipolar disorder    Neuromuscular scoliosis  Past Surgical History:    Reviewed history from 02/08/2007 and no changes required:    g tube    s/p Baclofen pump    s/p PSF T4-L4 03/1999  Family History:    Reviewed history from 05/21/2008 and no changes required:       Family History of Asthma       Family History Breast cancer        Family History of Prostate CA         Social History:    Reviewed history from 05/21/2008 and no changes required:       Single       Never Smoked       Alcohol use-no       Drug use-no       Lives with parents       wheelchair bound  Review of Systems  See HPI  Physical Exam  General:  Well-developed,well-nourished,in no acute distress; alert,appropriate and cooperative throughout examination Mouth:  Oral mucosa and oropharynx without lesions or exudates.  Teeth in good repair. Lungs:  Normal respiratory effort, chest expands symmetrically. Lungs are clear to auscultation, no crackles or wheezes. Heart:  Normal rate and regular rhythm. S1 and S2 normal without gallop, murmur, click, rub or other extra sounds. Abdomen:  Bowel sounds positive,abdomen soft and non-tender without masses, organomegaly or hernias noted. Psych:  Oriented X3 and normally interactive.     Impression & Recommendations:  Problem # 1:  COUGH (ICD-786.2)  Orders: Speech Therapy (Speech Therapy) T-1 View CXR (71010TC)  Problem # 2:  GENERALIZED ANXIETY DISORDER (ICD-300.02)  His updated medication list for this problem includes:    Paxil 30 Mg Tabs (Paroxetine hcl)    Alprazolam 0.5 Mg Tbdp  (Alprazolam) .Marland Kitchen... Take one-half to one tablet as needed for agitation/anxiety.  Discussed medication use and relaxation techniques.   Problem # 3:  PALPITATIONS (ICD-785.1)  The following medications were removed from the medication list:    Propranolol Hcl 10 Mg Tabs (Propranolol hcl) .Marland Kitchen... Tak eone tablet each day in the evening. His updated medication list for this problem includes:    Atenolol 25 Mg Tabs (Atenolol) .Marland Kitchen... 1 by mouth two times a day as needed  Avoid caffeine, chocolate, and stimulants. Stress reduction as well as medication options discussed.   Complete Medication List: 1)  Depakote Sprinkles 125 Mg Cpsp (Divalproex sodium) .... Take 1 capsule twice a day 2)  Paxil 30 Mg Tabs (Paroxetine hcl) 3)  Repairs To Dynavox  .... Per work order repairs are less expensive than buying new machine 4)  Zyprexa 5 Mg Tabs (Olanzapine) .... Take one tablet each evening. 5)  Alprazolam 0.5 Mg Tbdp (Alprazolam) .... Take one-half to one tablet as needed for agitation/anxiety. 6)  Carafate 1 Gm/87ml Susp (Sucralfate) .... Use 2 tsps through tube when tube is bleeding. 7)  Baclofen Pump  .Marland KitchenMarland KitchenMarland Kitchen 140 mcg daily. 8)  Heritage manager  .... Dispense one power wheel chair. 9)  Dynavox  .Marland Kitchen.. 343.2 10)  Doxycycline Hyclate 100 Mg Tabs (Doxycycline hyclate) .Marland Kitchen.. 1 by mouth two times a day 11)  Atenolol 25 Mg Tabs (Atenolol) .Marland Kitchen.. 1 by mouth two times a day as needed Prescriptions: ATENOLOL 25 MG TABS (ATENOLOL) 1 by mouth two times a day as needed  #60 x 0   Entered and Authorized by:   Loreen Freud DO   Signed by:   Loreen Freud DO on 02/25/2009   Method used:   [facility] to        [facility]  [address] [id number]       [address]       [place], [place]  [address]       Ph: [phone] or [phone]       Fax: [phone]   RxID:   [id number]

## 2010-12-02 NOTE — Assessment & Plan Note (Signed)
Summary: HEAD HURTS, STRONG ENOUGH FOR G-TUBE SURGERY?///SPH   Vital Signs:  Patient profile:   25 year old male O2 Sat:      97 % on Room air Temp:     98.6 degrees F oral Pulse rate:   75 / minute Pulse rhythm:   regular BP sitting:   96 / 68  (right arm) Cuff size:   regular  Vitals Entered By: Almeta Monas CMA Duncan Dull) (August 05, 2010 1:41 PM)  O2 Flow:  Room air CC: f/u on pneumonia, c/o head pain, not sleeping and mother wants to discuss G tube   History of Present Illness: Pt here with mom ---feeling much better.  No fevers or congestion.  Current Medications (verified): 1)  Depakote Sprinkles 125 Mg Cpsp (Divalproex Sodium) .... Take 2 Capsule in The Am and 4 Capsules Qpm Via G Tube 2)  Paxil 20 Mg Tabs (Paroxetine Hcl) .Marland Kitchen.. 1 By Mouth Qd 3)  Zyprexa 5 Mg Tabs (Olanzapine) .Marland Kitchen.. 1 By Mouth At Bedtime 4)  Alprazolam 0.5 Mg  Tbdp (Alprazolam) .... Take One-Half To One Tablet As Needed For Agitation/anxiety. 5)  Carafate 1 Gm/87ml  Susp (Sucralfate) .... Use 2 Tsps Through Tube When Tube Is Bleeding. 6)  Power Economist .... Dispense One Heritage manager. 7)  Dynavox .Marland Kitchen.. 343.2 8)  A Pair of Deep Lateral Supports .... As Directed Dx Cerebral Palsy 9)  Valium 5 Mg Tabs (Diazepam) .Marland Kitchen.. 1 By Mouth Two Times A Day As Needed 10)  Doxycycline Monohydrate 100 Mg Tabs (Doxycycline Monohydrate) .Marland Kitchen.. 1 By Mouth Once Daily 11)  Retin-A 0.1 % Crea (Tretinoin) .... Apply At Bedtime 12)  Albuterol Sulfate (2.5 Mg/25ml) 0.083% Nebu (Albuterol Sulfate) .Marland Kitchen.. 1 Neb Every 6 Hours As Needed For Chest Congestion 13)  Oxycodone Hcl 5 Mg Tabs (Oxycodone Hcl) .Marland Kitchen.. 1-2 Tablets Every 4-6 Hours 14)  Nexium 40 Mg Pack (Esomeprazole Magnesium) .... Take 1 Pack Via Peg Once Daily...pharmacy-Please D/c Prescription For Zegerid...too Expensive 15)  Carafate 1 Gm/75ml Susp (Sucralfate) .... Take 10 Cc By Mouth Two Times A Day 16)  Avelox 400 Mg Tabs (Moxifloxacin Hcl) .Marland Kitchen.. 1 By Mouth Once  Daily  Allergies (verified): 1)  ! Sulfa 2)  ! Bactrim  Past History:  Past medical, surgical, family and social histories (including risk factors) reviewed for relevance to current acute and chronic problems.  Past Medical History: Reviewed history from 08/04/2010 and no changes required. CP grade 3 kidney reflux Neurogenic bladder/ Trabeculated bladder G tube Bipolar disorder Neuromuscular scoliosis  ANXIETY (ICD-300.00) DEPRESSION (ICD-311) HYPERTHYROIDISM (ICD-242.90) ESOPHAGITIS, HX OF (ICD-V12.79) GERD (ICD-530.81) ASPIRATION PNEUMONIA (ICD-507.0) PREVENTIVE HEALTH CARE (ICD-V70.0) ABDOMINAL PAIN, RIGHT LOWER QUADRANT (ICD-789.03) EDEMA, ANKLES (ICD-782.3) OTHER ACNE (ICD-706.1) PALPITATIONS (ICD-785.1) GENERALIZED ANXIETY DISORDER (ICD-300.02) COUGH (ICD-786.2) HIP PAIN, BILATERAL (ICD-719.45) NEVI, MULTIPLE (ICD-216.9) ABDOMINAL PAIN OTHER SPECIFIED SITE (ICD-789.09) DYSURIA (ICD-788.1) PREOPERATIVE EXAMINATION (ICD-V72.84) UNSPECIFIED PRE-OPERATIVE EXAMINATION (ICD-V72.84) FAMILY HISTORY BREAST CANCER 1ST DEGREE RELATIVE <50 (ICD-V16.3) FAMILY HISTORY OF ASTHMA (ICD-V17.5) DYSPHAGIA UNSPECIFIED (ICD-787.20) INCONTINENCE OF FECES (ICD-787.6) UNSPECIFIED URINARY INCONTINENCE (ICD-788.30) 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 g tube s/p Baclofen pump with revision x 1 s/p PSF T4-L4 03/1999 Hamstring release x 2 Spinal Fusion Toe Surgery x 2 Eye Surgery x 2 Tonsillectomy  Family History: Reviewed history from 08/01/2010 and no changes required. Family History of Asthma Family History Breast  cancer : Maternal Grandmother Family History of Prostate CA: Maternal Grandfather Family History of Colon Cancer: Great  Grandmother Family History of Colon Polyps: Father Family History of Heart Disease: Paternal Grandfather  Social History: Reviewed history from 05/21/2008 and no changes required. Single Never Smoked Alcohol use-no Drug use-no Lives with parents wheelchair bound  Review of Systems      See HPI  Physical Exam  General:  Well-developed,well-nourished,in no acute distress; alert,appropriate and cooperative throughout examination Nose:  External nasal examination shows no deformity or inflammation. Nasal mucosa are pink and moist without lesions or exudates. Mouth:  drooling Neck:  No deformities, masses, or tenderness noted. Lungs:  ? crackles difficult to assess secondary to pt being in wheelchair Heart:  normal rate and no murmur.   Psych:  Oriented X3 and normally interactive.     Impression & Recommendations:  Problem # 1:  ASPIRATION PNEUMONIA (ICD-507.0) xray shows effusions and possible pneumonia avelox sent to pharmacy and pt referred to pulm Orders: Pulmonary Referral (Pulmonary)  Problem # 2:  ANXIETY (ICD-300.00)  His updated medication list for this problem includes:    Paxil 20 Mg Tabs (Paroxetine hcl) .Marland Kitchen... 1 by mouth qd    Alprazolam 0.5 Mg Tbdp (Alprazolam) .Marland Kitchen... Take one-half to one tablet as needed for agitation/anxiety.    Valium 5 Mg Tabs (Diazepam) .Marland Kitchen... 1 by mouth two times a day as needed  Complete Medication List: 1)  Depakote Sprinkles 125 Mg Cpsp (Divalproex sodium) .... Take 2 capsule in the am and 4 capsules qpm via g tube 2)  Paxil 20 Mg Tabs (Paroxetine hcl) .Marland Kitchen.. 1 by mouth qd 3)  Zyprexa 5 Mg Tabs (Olanzapine) .Marland Kitchen.. 1 by mouth at bedtime 4)  Alprazolam 0.5 Mg Tbdp (Alprazolam) .... Take one-half to one tablet as needed for agitation/anxiety. 5)  Carafate 1 Gm/54ml Susp (Sucralfate) .... Use 2 tsps through tube when tube is bleeding. 6)  Power Economist  .... Dispense one power wheel chair. 7)  Dynavox  .Marland Kitchen.. 343.2 8)  A Pair of Deep  Lateral Supports  .... As directed dx cerebral palsy 9)  Valium 5 Mg Tabs (Diazepam) .Marland Kitchen.. 1 by mouth two times a day as needed 10)  Doxycycline Monohydrate 100 Mg Tabs (Doxycycline monohydrate) .Marland Kitchen.. 1 by mouth once daily 11)  Retin-a 0.1 % Crea (Tretinoin) .... Apply at bedtime 12)  Albuterol Sulfate (2.5 Mg/63ml) 0.083% Nebu (Albuterol sulfate) .Marland Kitchen.. 1 neb every 6 hours as needed for chest congestion 13)  Oxycodone Hcl 5 Mg Tabs (Oxycodone hcl) .Marland Kitchen.. 1-2 tablets every 4-6 hours 14)  Nexium 40 Mg Pack (Esomeprazole magnesium) .... Take 1 pack via peg once daily...pharmacy-please d/c prescription for zegerid...too expensive 15)  Carafate 1 Gm/10ml Susp (Sucralfate) .... Take 10 cc by mouth two times a day 16)  Avelox 400 Mg Tabs (Moxifloxacin hcl) .Marland Kitchen.. 1 by mouth once daily  Other Orders: Admin 1st Vaccine (25366) Flu Vaccine 68yrs + (44034) Flu Vaccine Consent Questions     Do you have a history of severe allergic reactions to this vaccine? no    Any prior history of allergic reactions to egg and/or gelatin? no    Do you have a sensitivity to the preservative Thimersol? no    Do you have a past history of Guillan-Barre Syndrome? no    Do you currently have an acute febrile illness? no    Have you ever had a severe reaction to latex? no    Vaccine information given and explained to  patient? yes    Are you currently pregnant? no    Lot Number:[id number]   Exp Date:05/02/2011   Site Given  Right Deltoid IM Flu Vaccine 48yrs + (40347) Prescriptions: DEPAKOTE SPRINKLES 125 MG CPSP (DIVALPROEX SODIUM) Take 2 capsule in the am and 4 capsules qpm via g tube Brand medically necessary #180 Capsule x 5   Entered and Authorized by:   Loreen Freud DO   Signed by:   Loreen Freud DO on 08/05/2010   Method used:   Print then Give to Patient   RxID:   [id number] AVELOX 400 MG TABS (MOXIFLOXACIN HCL) 1 by mouth once daily  #10 x 0   Entered and Authorized by:   Loreen Freud DO   Signed by:   Loreen Freud DO on 08/05/2010   Method used:   [facility]  [address] [id number]       [address]       [place], [place]  [address]       Ph: [phone] or [phone]       Fax: [phone]   RxID:   [id number] DEPAKOTE SPRINKLES 125 MG CPSP (DIVALPROEX SODIUM) Take 2 capsule in the am and 4 capsules qpm via g tube Brand medically necessary #180 Capsule x 5   Entered and Authorized by:   Loreen Freud DO   Signed by:   Loreen Freud DO on 08/05/2010   Method used:   Print then Give to Patient   RxID:   [id number]   .lbflu

## 2010-12-02 NOTE — Progress Notes (Signed)
Summary: lowne-details needed on the diovox script   Medications Added * REPAIRS TO DYNAVOX per work order repairs are less expensive than buying new machine       Phone Note From Other Clinic Call back at [facility] [phone]   Caller: case worker Summary of Call: the script needs to say that the dinovox was broken and the repairs were necessary. it also needs to state that it was cheaper to fix the device rather than purchasing a new one.  please call her first before faxing the information.  she really needs to get this resolved today because they are not going to give her anymore extensions. Initial call taken by: Gwen Pounds,  October 18, 2007 12:45 PM  Follow-up for Phone Call        SPOKE WITH [facility] FAXED RX TO [phone] Follow-up by: Doristine Devoid,  October 18, 2007 4:47 PM    New/Updated Medications: * REPAIRS TO DYNAVOX per work order repairs are less expensive than buying new machine   Prescriptions: REPAIRS TO DYNAVOX per work order repairs are less expensive than buying new machine  #0 x 0   Entered by:   Doristine Devoid   Authorized by:   Loreen Freud DO   Signed by:   Doristine Devoid on 10/18/2007   Method used:   Reprint   RxID:   [id number] REPAIRS TO DYNAVOX per work order repairs are less expensive than buying new machine  #0 x 0   Entered by:   Doristine Devoid   Authorized by:   Loreen Freud DO   Signed by:   Doristine Devoid on 10/18/2007   Method used:   Print then Give to Patient   RxID:   [id number]

## 2010-12-02 NOTE — Miscellaneous (Signed)
Summary: Care Plan/[facility]  Care Plan/[facility]   Imported By: Lanelle Bal 09/19/2009 09:35:41  _____________________________________________________________________  External Attachment:    Type:   Image     Comment:   External Document

## 2010-12-02 NOTE — Letter (Signed)
Summary: Rehabilitation  Rehabilitation   Imported By: Freddy Jaksch 05/07/2008 14:44:37  _____________________________________________________________________  External Attachment:    Type:   Image     Comment:   External Document

## 2010-12-02 NOTE — Miscellaneous (Signed)
Summary: Plan of Care/[facility]   Plan of Care/[facility]   Imported By: Lanelle Bal 05/23/2009 12:22:12  _____________________________________________________________________  External Attachment:    Type:   Image     Comment:   External Document

## 2010-12-02 NOTE — Letter (Signed)
Summary: [facility]  [facility]   Imported By: Lanelle Bal 07/23/2010 08:40:10  _____________________________________________________________________  External Attachment:    Type:   Image     Comment:   External Document

## 2010-12-02 NOTE — Miscellaneous (Signed)
Summary: Discharge Notice/[facility]  Discharge Notice/[facility]   Imported By: Lanelle Bal 08/22/2010 11:50:38  _____________________________________________________________________  External Attachment:    Type:   Image     Comment:   External Document

## 2010-12-02 NOTE — Assessment & Plan Note (Signed)
Summary: aspirational pnuemonia/ mbw   Visit Type:  Initial Consult Copy to:  Dr [name] Primary Provider/Referring Provider:  Loreen Freud, DO , Dr Madilyn Fireman - Neurosurgeon [facility]   CC:  Pulmonary Consult for aspiration pneumonia. Pt set to have G tube replaced but last CXR still shows PNA. Marland Kitchen  History of Present Illness: September 12, 2010: 25 year old male. His mom Jane Broughton is my patient for asthma. HE has cerebral palsy. Underwent 8h T9-C6 spinal fusion at [facility] in Sept 2011. Reportredly PACU RN fed patient the following day pancake and patient aspirated. Had aspiration pneumonia and was in ICU for 3 days. Not intubated.  Dischargd home several days later. AT home mom noticed coughing initially when positions changed. FU cXR in Oct 2011 here at [facility] still showed infiltrates but by this time he was asymptomatic. Currently well and baseline. Needs G tube replacement now. So. Dr Juanda Chance wants pulmnary clearance before diprivan based G tube replacement. Current baseline.   Baseline mom has noticed wheezing in swimming pool along with shortness of breath. This happens only in summer. Otherwise, well without problems   Preventive Screening-Counseling & Management  Alcohol-Tobacco     Alcohol drinks/day: 0     Smoking Status: never  Caffeine-Diet-Exercise     Diet Comments: loves sweets  Current Medications (verified): 1)  Depakote Sprinkles 125 Mg Cpsp (Divalproex Sodium) .... Take 2 Capsule in The Am and 4 Capsules Qpm Via G Tube 2)  Paxil 20 Mg Tabs (Paroxetine Hcl) .Marland Kitchen.. 1 By Mouth Qd 3)  Zyprexa 5 Mg Tabs (Olanzapine) .Marland Kitchen.. 1 By Mouth At Bedtime 4)  Alprazolam 0.5 Mg  Tbdp (Alprazolam) .... Take One-Half To One Tablet As Needed For Agitation/anxiety. 5)  Carafate 1 Gm/73ml  Susp (Sucralfate) .... Use 2 Tsps Through Tube When Tube Is Bleeding. 6)  Power Economist .... Dispense [employer] manager. 7)  Dynavox .Marland Kitchen.. 343.2 8)  A Pair of Deep Lateral  Supports .... As Directed Dx Cerebral Palsy 9)  Valium 5 Mg Tabs (Diazepam) .Marland Kitchen.. 1 By Mouth Two Times A Day As Needed 10)  Doxycycline Monohydrate 100 Mg Tabs (Doxycycline Monohydrate) .Marland Kitchen.. 1 By Mouth Once Daily 11)  Retin-A 0.1 % Crea (Tretinoin) .... Apply At Bedtime 12)  Albuterol Sulfate (2.5 Mg/54ml) 0.083% Nebu (Albuterol Sulfate) .Marland Kitchen.. 1 Neb Every 6 Hours As Needed For Chest Congestion 13)  Oxycodone Hcl 5 Mg Tabs (Oxycodone Hcl) .Marland Kitchen.. 1-2 Tablets Every 4-6 Hours 14)  Nexium 40 Mg Pack (Esomeprazole Magnesium) .... Take 1 Pack Via Peg Once Daily...pharmacy-Please D/c Prescription For Zegerid...too Expensive 15)  Carafate 1 Gm/34ml Susp (Sucralfate) .... Take 10 Cc By Mouth Two Times A Day  Allergies (verified): 1)  ! Sulfa 2)  ! Bactrim  Past History:  Past Surgical History: g tube s/p Baclofen pump with revision x 1 s/p PSF T4-L4 03/1999 Hamstring release x 2 Spinal Fusion Toe Surgery x 2 Eye Surgery x 2 Tonsillectomy  Spinal Fusion from T-9 to C6 at [facility], Dr. Sharon Seller [facility]-07/2010  Review of Systems  The patient denies shortness of breath with activity, shortness of breath at rest, productive cough, non-productive cough, coughing up blood, chest pain, irregular heartbeats, acid heartburn, indigestion, loss of appetite, weight change, abdominal pain, difficulty swallowing, sore throat, tooth/dental problems, headaches, nasal congestion/difficulty breathing through nose, sneezing, itching, ear ache, anxiety, depression, hand/feet swelling, joint stiffness or pain, rash, change in color of mucus, and fever.    Vital  Signs:  Patient profile:   25 year old male Height:      63 inches Weight:      115 pounds BMI:     20.44 O2 Sat:      95 % on Room air Temp:     98.1 degrees F oral Pulse rate:   76 / minute BP sitting:   102 / 60  (right arm) Cuff size:   regular  Vitals Entered By: Carron Curie CMA (September 12, 2010 2:32 PM)  O2 Flow:  Room air CC:  Pulmonary Consult for aspiration pneumonia. Pt set to have G tube replaced but last CXR still shows PNA.  Comments Medications reviewed with patient Carron Curie CMA  September 12, 2010 2:32 PM Daytime phone number verified with patient.    Physical Exam  General:  Well-developed,well-nourished,in no acute distress; alert,appropriate and cooperative throughout examination Head:  no abnormalities observed.   Eyes:  PERRLA, no icterus. Ears:  External ear exam shows no significant lesions or deformities.  Otoscopic examination reveals clear canals, tympanic membranes are intact bilaterally without bulging, retraction, inflammation or discharge. Hearing is grossly normal bilaterally. Nose:  External nasal examination shows no deformity or inflammation. Nasal mucosa are pink and moist without lesions or exudates. Mouth:  drooling Neck:  No deformities, masses, or tenderness noted. Chest Wall:  chest deformity with recent scar over the cervical spine from a fusion. Lungs:  ? crackles difficult to assess secondary to pt being in wheelchair Heart:  normal rate and no murmur.   Abdomen:  soft Msk:  decreased ROM.   contractures Extremities:  deformities of the upper and lower extremities due to cerebral palsy. Neurologic:  alert & oriented X3.   + contractures pt wheelchair bound Skin:  Intact without suspicious lesions or rashes Cervical Nodes:  No lymphadenopathy noted Psych:  Oriented X3 and normally interactive.     CXR  Procedure date:  08/05/2010  Findings:      infiltrates and bilateral small effusions  CXR  Procedure date:  09/12/2010  Findings:      clearing infitlrates. Effusions are trace and better compared to 08/05/2010  Impression & Recommendations:  Problem # 1:  ASPIRATION PNEUMONIA (ICD-507.0) Assessment New  clinically well. Small effusion in Oct cxr. Repeat cxr shows improiving trace effusion. No infiltrates  plan expectant clinical  followup  Orders: T-2 View CXR (71020TC) Consultation Level IV (84132)  Problem # 2:  PRE-OPERATIVE RESPIRATORY EXAMINATION (ICD-V72.82) Assessment: New  appears wel and low - moderate risk for pulm complications following PEG tube plaement. Definitely agree wiht anesthesia support and procedure under LMA  at a mininum. Will forward message to Dr. [name]  Orders: T-2 View CXR (71020TC) Consultation Level IV [phone])  Patient Instructions: 1)  have cxr today 2)  ok to have procedure unless cxr is alarming 3)  will call you with cxr report 4)  wil forward message to dR. [name] 5)  return depending on cxr result

## 2010-12-02 NOTE — Assessment & Plan Note (Signed)
Summary: f/u pneumonia - ref to [facility] /[facility]   Vital Signs:  Patient profile:   25 year old male O2 Sat:      95 % on Room air Pulse rate:   94 / minute Pulse rhythm:   regular BP sitting:   116 / 60  (right arm)  Vitals Entered By: Almeta Monas CMA Duncan Dull) (July 14, 2010 11:46 AM)  O2 Flow:  Room air  Serial Vital Signs/Assessments:  Time      Position  BP       Pulse  Resp  Temp     By 12:22 PM                     70                    Loreen Freud DO   CC: F/U Pneumonia Comments Unable to get weight due to pt being wheel chair bound   History of Present Illness: Pt here f/u aspiration pneumonia-- he is taking augmentin.  Today is his last day of antibiotics.  Pt is uncomfortable at night so we are getting hospital bed for him.   He is still choking on his food so he is afraid to eat.     Current Medications (verified): 1)  Depakote Sprinkles 125 Mg Cpsp (Divalproex Sodium) .... Take 2 Capsule in The Am and 4 Capsules Qpm Via G Tube 2)  Paxil 20 Mg Tabs (Paroxetine Hcl) .Marland Kitchen.. 1 By Mouth Qd 3)  Zyprexa 5 Mg Tabs (Olanzapine) .Marland Kitchen.. 1 By Mouth At Bedtime 4)  Alprazolam 0.5 Mg  Tbdp (Alprazolam) .... Take One-Half To One Tablet As Needed For Agitation/anxiety. 5)  Carafate 1 Gm/60ml  Susp (Sucralfate) .... Use 2 Tsps Through Tube When Tube Is Bleeding. 6)  Power Economist .... Dispense [employer] manager. 7)  Dynavox .Marland Kitchen.. 343.2 8)  A Pair of Deep Lateral Supports .... As Directed Dx Cerebral Palsy 9)  Valium 5 Mg Tabs (Diazepam) .Marland Kitchen.. 1 By Mouth Two Times A Day As Needed 10)  Doxycycline Monohydrate 100 Mg Tabs (Doxycycline Monohydrate) .Marland Kitchen.. 1 By Mouth Once Daily 11)  Retin-A 0.1 % Crea (Tretinoin) .... Apply At Bedtime 12)  Albuterol Sulfate (2.5 Mg/27ml) 0.083% Nebu (Albuterol Sulfate) .Marland Kitchen.. 1 Neb Every 6 Hours As Needed For Chest Congestion 13)  Iv Boards .... Re Contractures Secondary To Cerebral Palsy 14)  Hospital Bed With Air Mattress .... Dx  507,  343.2 15)   Oxycodone Hcl 5 Mg Tabs (Oxycodone Hcl) .Marland Kitchen.. 1-2 Tablets Every 4-6 Hours  Allergies (verified): 1)  ! Sulfa  Past History:  Past medical, surgical, family and social histories (including risk factors) reviewed for relevance to current acute and chronic problems.  Past Medical History: Reviewed history from 02/08/2007 and no changes required. CP grade 3 kidney reflux Neurogenic bladder/ Trabeculated bladder G tube Bipolar disorder Neuromuscular scoliosis  Past Surgical History: Reviewed history from 02/08/2007 and no changes required. g tube s/p Baclofen pump s/p PSF T4-L4 03/1999  Family History: Reviewed history from 05/21/2008 and no changes required. Family History of Asthma Family History Breast cancer  Family History of Prostate CA  Social History: Reviewed history from 05/21/2008 and no changes required. Single Never Smoked Alcohol use-no Drug use-no Lives with parents wheelchair bound  Review of Systems      See HPI  Physical Exam  General:  Well-developed,well-nourished,in no acute distress; alert,appropriate and cooperative throughout examination Neck:  No deformities,  masses, or tenderness noted. Lungs:  Normal respiratory effort, chest expands symmetrically. Lungs are clear to auscultation, no crackles or wheezes. Heart:  Normal rate and regular rhythm. S1 and S2 normal without gallop, murmur, click, rub or other extra sounds. Extremities:  No clubbing, cyanosis, edema, or deformity noted with normal full range of motion of all joints.   Cervical Nodes:  No lymphadenopathy noted Psych:  Cognition and judgment appear intact. Alert and cooperative with normal attention span and concentration. No apparent delusions, illusions, hallucinations   Impression & Recommendations:  Problem # 1:  ASPIRATION PNEUMONIA (ICD-507.0)  Orders: T-2 View CXR (71020TC) Misc. Referral (Misc. Ref)  Complete Medication List: 1)  Depakote Sprinkles 125 Mg Cpsp  (Divalproex sodium) .... Take 2 capsule in the am and 4 capsules qpm via g tube 2)  Paxil 20 Mg Tabs (Paroxetine hcl) .Marland Kitchen.. 1 by mouth qd 3)  Zyprexa 5 Mg Tabs (Olanzapine) .Marland Kitchen.. 1 by mouth at bedtime 4)  Alprazolam 0.5 Mg Tbdp (Alprazolam) .... Take one-half to one tablet as needed for agitation/anxiety. 5)  Carafate 1 Gm/32ml Susp (Sucralfate) .... Use 2 tsps through tube when tube is bleeding. 6)  Power Economist  .... Dispense one power wheel chair. 7)  Dynavox  .Marland Kitchen.. 343.2 8)  A Pair of Deep Lateral Supports  .... As directed dx cerebral palsy 9)  Valium 5 Mg Tabs (Diazepam) .Marland Kitchen.. 1 by mouth two times a day as needed 10)  Doxycycline Monohydrate 100 Mg Tabs (Doxycycline monohydrate) .Marland Kitchen.. 1 by mouth once daily 11)  Retin-a 0.1 % Crea (Tretinoin) .... Apply at bedtime 12)  Albuterol Sulfate (2.5 Mg/62ml) 0.083% Nebu (Albuterol sulfate) .Marland Kitchen.. 1 neb every 6 hours as needed for chest congestion 13)  Iv Boards  .... 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 Dx  507,  343.2 15)  Oxycodone Hcl 5 Mg Tabs (Oxycodone hcl) .Marland Kitchen.. 1-2 tablets every 4-6 hours

## 2010-12-02 NOTE — Progress Notes (Signed)
Summary: G tube concern   Phone Note Call from Patient Call back at Home Phone [phone]   Caller: mother, [name] Call For: Dr. Juanda Chance Reason for Call: Talk to Nurse Summary of Call: states that pt needs G tube replaced but doesnt know when pt should come in for this Initial call taken by: Vallarie Mare,  September 02, 2010 9:31 AM  Follow-up for Phone Call        Spoke with patient's mother [name]. She is having problems with PEG tube giving patient's medications. States "I know it needs to be changed." Our office note from 08/04/10, states we are waiting on pulmonary clearance to reschedule PEG tube change. Mother states patient has an appointment with pulmonary MD on 09/12/10. She wants to know if we can go ahead and "schedule to change the PEG on a date after this visit. Please, advise. Follow-up by: Jesse Fall RN,  September 02, 2010 10:08 AM  Additional Follow-up for Phone Call Additional follow up Details #1::        I have spoken to mother [name], pt is doing OK, CXR ordered for tomorrow, then he will see Dr Colletta Maryland. I will set up change of PEG under Propofol. Additional Follow-up by: Hart Carwin MD,  September 02, 2010 8:33 PM

## 2010-12-02 NOTE — Letter (Signed)
Summary: [facility]  [facility]   Imported By: Lanelle Bal 02/19/2010 09:33:40  _____________________________________________________________________  External Attachment:    Type:   Image     Comment:   External Document

## 2010-12-02 NOTE — Progress Notes (Signed)
Summary: forms  Phone Note Call from Patient   Summary of Call: Dr. Laury Axon      paper dropped off to be filled out------paper given to the nurse. Initial call taken by: Vanessa Swazil[place],  July 20, 2008 3:31 PM  Follow-up for Phone Call        On Dr. Ernst Spell ledge Ardyth Man  July 20, 2008 3:40 PM  Follow-up by: Ardyth Man,  July 20, 2008 3:40 PM  Additional Follow-up for Phone Call Additional follow up Details #1::        Letter ready for pick up. Ardyth Man  July 26, 2008 3:59 PM  Additional Follow-up by: Ardyth Man,  July 26, 2008 3:59 PM      Appended Document: forms Patient mother aware Ardyth Man, 07/26/08

## 2010-12-02 NOTE — Progress Notes (Signed)
Summary: [name] pt status  Phone Note Call from Patient   Caller: Mom Summary of Call: Pt mom states that pt just had spinal fusion. Pt had some complication with this procedure and ended up getting aspirated pneumonia. Pt mom states that pt was D/C on yesterday which she feel he should not have been. Pt mom would like to discuss options for pt such as nursing facility, rehab, or home health aide. Pt mom states that she is really stressed, and tired right now which makes it so very hard to deal with this situation. Pt mom states that she just doesn't know what to do. Advise pt mom to come in for OV and bring all materials and info she has on facility so that she can discuss option with dr Laury Axon. Pt and mom coming in for appt to discuss................Marland KitchenFelecia Deloach CMA  July 11, 2010 1:29 PM    Follow-up for Phone Call        that is fine ----I will put an urgent request into home health as well to see what can be done in meantime Follow-up by: Loreen Freud DO,  July 11, 2010 1:59 PM  Additional Follow-up for Phone Call Additional follow up Details #1::        Left message to call back. Almeta Monas CMA Duncan Dull)  July 11, 2010 2:41 PM    New Problems: ASPIRATION PNEUMONIA (ICD-507.0)   Additional Follow-up for Phone Call Additional follow up Details #2::    pt aware, ok with a nurse visiting.   Almeta Monas CMA Duncan Dull)  July 11, 2010 3:46 PM   New Problems: ASPIRATION PNEUMONIA (ICD-507.0)

## 2010-12-02 NOTE — Progress Notes (Signed)
Summary: rx for hospital bed w air mattress   Phone Note Call from Patient Call back at Home Phone [phone]   Caller: Mom Summary of Call: patient mom called homehealth  did evaluation  sunday [phone] he will need hospital bed  motion air mattress - he will [facility] - if you need to consult surgeons pa her # is [phone] rachael robinson - patient mom said they are very supportive  Initial call taken by: Okey Regal [place],  July 14, 2010 8:54 AM  Follow-up for Phone Call        done Follow-up by: Loreen Freud DO,  July 14, 2010 9:23 AM    New/Updated Medications: * HOSPITAL BED WITH AIR MATTRESS dx  507,  343.2 Prescriptions: HOSPITAL BED WITH AIR MATTRESS dx  507,  343.2  #1 x 0   Entered and Authorized by:   Loreen Freud DO   Signed by:   Loreen Freud DO on 07/14/2010   Method used:   Print then Give to Patient   RxID:   [id number]

## 2010-12-02 NOTE — Letter (Signed)
Summary: Generic Letter  [facility] [place]  [address] [place], [place] [address]   Phone: [phone]  Fax: [phone]    07/26/2008       To Whom It May Concern:  In regards to [name] (DOB: 1986-04-23), there are several reasons that this patient needs a power wheelchair with a tilt/recline seating system to be driven by head controls and joystick.  [name] has used the same wheelchair for 8 year since spring of 2001, and these are all the same features that he has had.  The chair that he is in now does not function properly because of wear and tear due to daily all day use. The need for the tilt-recline seating options is that it is used all the time throughout the day. Other reasons are that [name] does not have any sitting balance, so tilt/recline is very important so he is not fighting gravity to sit up.  It helps [name] to be slightly tilted and reclined to lean into his headrest and drive.  The caregivers use the tilt/recline and swing away arm rests to tend to his toileting needs (for use when using the urinal in his chair) and to take care of his external catheter and leg bag needs. It is used to recline him to gain better access to his G-tube for tube feedings/hydration and giving his meds through the tube.  It is also used to change his positioning depending on activity and to prevent pressure sores from being in one position.  The need for 2 driving controls, the headrest and joystick, first because they have been used for the past 8 years plus, as well. This works well for [name] and caregivers so we want to continue with the same.  [name] can drive with his head controls and whe he is tilted/reclined slightly in more wide open areas independently.  In tighter areas, or when chair must be backed up, the caregivers use the joystick to maneuver through those areas and into the wheelchair [name] which is through narrow doorways/hallways and tight turns.   Caregivers also use the joystick to take over driving when [name] becomes fatigued or there are tone issues.  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  Sometimes he is spastic, sometimes he is low tone.  With low tone, more tilt/recline is needed.  Also, when [name]'s tone is real low, it can be much harder for him to hold his head up for long periods of time, so the use of the joystick is used.  As far as #2 under steps to a PMD-obviously [name] is not ambulatory so anything other than a power wheelchair are out of the question.  [name] would not have any independency in using a manual wheelchair because of his quadriplegia with no use of his hands or arms to propel a manual wheechair would be out of the question.  Also, the basic manual wheelchair seats do not suit [name]'s positioning needs.  [name] would be the beneficiary in this case. This power wheelchair would be a lifetime need for [name] and date of face to face examination was performed on 23-Jul-2008.         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

## 2010-12-02 NOTE — Miscellaneous (Signed)
Summary: Revised Face to Face Encounter/[facility]  Revised Face to Face Encounter/[facility]   Imported By: Lanelle Bal 09/02/2010 08:32:02  _____________________________________________________________________  External Attachment:    Type:   Image     Comment:   External Document

## 2010-12-02 NOTE — Letter (Signed)
Summary: External Correspondence-lab order  External Correspondence-lab order   Imported By: Doristine Devoid 04/29/2007 09:44:37  _____________________________________________________________________  External Attachment:    Type:   Image     Comment:   External Document

## 2010-12-02 NOTE — Medication Information (Signed)
Summary: Approval for Tretinoin/[facility]  Approval for Tretinoin/[facility]   Imported By: Lanelle Bal 10/30/2009 11:41:00  _____________________________________________________________________  External Attachment:    Type:   Image     Comment:   External Document

## 2010-12-02 NOTE — Progress Notes (Signed)
Summary: Medication  Medications Added NEXIUM 40 MG PACK (ESOMEPRAZOLE MAGNESIUM) Take 1 pack via PEG once daily...pharmacy-please d/c prescription for Zegerid...too expensive       Phone Note Call from Patient Call back at Home Phone [phone]   Caller: mother Bonita Quin Call For: Dr. Juanda Chance Summary of Call: Zegerid is too expensive...needs an alternative med. Initial call taken by: Karna Christmas,  August 04, 2010 3:49 PM  Follow-up for Phone Call        Prescription sent for Nexium instead. Follow-up by: Lamona Curl CMA Duncan Dull),  August 04, 2010 4:36 PM    New/Updated Medications: NEXIUM 40 MG PACK (ESOMEPRAZOLE MAGNESIUM) Take 1 pack via PEG once daily...pharmacy-please d/c prescription for Zegerid...too expensive Prescriptions: NEXIUM 40 MG PACK (ESOMEPRAZOLE MAGNESIUM) Take 1 pack via PEG once daily...pharmacy-please d/c prescription for Zegerid...too expensive  #30 packs x 3   Entered by:   Lamona Curl CMA (AAMA)   Authorized by:   Hart Carwin MD   Signed by:   Lamona Curl CMA (AAMA) on 08/04/2010   Method used:   Electronically to        [facility]  [address] [id number] (retail)       [address]       [place], [place]  [address]       Ph: [phone] or [phone]       Fax: [phone]   RxID:   [id number]

## 2010-12-02 NOTE — Miscellaneous (Signed)
Summary: Home Care Report  Home Care Report   Imported By: Freddy Jaksch 03/29/2008 10:54:19  _____________________________________________________________________  External Attachment:    Type:   Image     Comment:   External Document

## 2010-12-02 NOTE — Miscellaneous (Signed)
Summary: Orders Update  Medications Added * A PAIR OF DEEP LATERAL SUPPORTS as directed dx cerebral palsy       Clinical Lists Changes  Medications: Added new medication of * A PAIR OF DEEP LATERAL SUPPORTS as directed dx cerebral palsy - Signed Rx of A PAIR OF DEEP LATERAL SUPPORTS as directed dx cerebral palsy;  #1 x 0;  Signed;  Entered by: Loreen Freud DO;  Authorized by: Loreen Freud DO;  Method used: Print then Give to Patient    Prescriptions: A PAIR OF DEEP LATERAL SUPPORTS as directed dx cerebral palsy  #1 x 0   Entered and Authorized by:   Loreen Freud DO   Signed by:   Loreen Freud DO on 03/21/2009   Method used:   Print then Give to Patient   RxID:   [id number]

## 2010-12-02 NOTE — Miscellaneous (Signed)
Summary: Order Confirmation for [facility]  Order Confirmation for [facility]   Imported By: Lanelle Bal 07/28/2010 10:46:46  _____________________________________________________________________  External Attachment:    Type:   Image     Comment:   External Document

## 2010-12-02 NOTE — Assessment & Plan Note (Signed)
Summary: FILL OUT FORM    PCP:  [facility]   History of Present Illness: Form completed; no charge          Complete Medication List: 1)  Aciphex 20 Mg Tbec (Rabeprazole sodium) .Marland Kitchen.. 1 twice daily 2)  Depakote Sprinkles 125 Mg Cpsp (Divalproex sodium) .... Take 1 capsule twice a day 3)  Paxil 30 Mg Tabs (Paroxetine hcl) 4)  Repairs To Dynavox  .... Per work order repairs are less expensive than buying new machine    ]  Appended Document: FILL OUT FORM Correction: this was Home Health Certification; I already submitted charges. This EMR entry was not needed

## 2010-12-02 NOTE — Progress Notes (Signed)
Summary: FYI ED//[facility]  Phone Note Call from Patient   Caller: Mom Summary of Call: PT MOM CALLS TO SAY SON C/O CHEST PAIN SAYS FEELS LIKE A SQUEEZING THIS IS THE 3RD DAY DR Laury Axon I RECOMMEND ED ASAP MOTHER SAYS WILL TAKE SON TO ED ON [place] DAIR[address] Initial call taken by: Kandice Hams,  July 04, 2009 9:16 AM  Follow-up for Phone Call        please call later and see how pt is doing.    Follow-up by: Loreen Freud DO,  July 04, 2009 12:05 PM  Additional Follow-up for Phone Call Additional follow up Details #1::        pt mother states that everything is fine with the heart. But she feels that he may have over did it in pool on Monday. .....................Marland KitchenFelecia Deloach CMA  July 04, 2009 5:12 PM

## 2010-12-02 NOTE — Assessment & Plan Note (Signed)
Summary: ortho referral

## 2010-12-02 NOTE — Letter (Signed)
Summary: [facility]  [facility]   Imported By: Lanelle Bal 01/02/2009 12:22:59  _____________________________________________________________________  External Attachment:    Type:   Image     Comment:   External Document

## 2010-12-02 NOTE — Miscellaneous (Signed)
Summary: Plan of Care/[facility]  Plan of Care/[facility]   Imported By: Lanelle Bal 07/30/2008 10:12:30  _____________________________________________________________________  External Attachment:    Type:   Image     Comment:   External Document

## 2010-12-02 NOTE — Miscellaneous (Signed)
Summary: [facility] to Face Encounter/[facility]  Face to Face Encounter/[facility]   Imported By: Lanelle Bal 09/22/2010 12:41:32  _____________________________________________________________________  External Attachment:    Type:   Image     Comment:   External Document

## 2010-12-02 NOTE — Letter (Signed)
Summary: EGD Instructions  [facility]  [address] [place], [place] [address]   Phone: [phone]  Fax: [phone]       [name]    [birth year]    MRN: [id number]       Procedure Day /Date: Thursday 08/14/10     Arrival Time: 1:00 pm     Procedure Time: 2:00 pm     Location of Procedure:                    _x  _ [facility] ( Outpatient Registration)   PREPARATION FOR ENDOSCOPY   On 08/14/10 THE DAY OF THE PROCEDURE:  1.   No solid foods, milk or milk products are allowed after midnight the night before your procedure.  2.   Do not drink anything colored red or purple.  Avoid juices with pulp.  No orange juice.  3.  You may drink clear liquids until 10:00 am, which is 4 hours before your procedure.                                                                                                CLEAR LIQUIDS INCLUDE: Water Jello Ice Popsicles Tea (sugar ok, no milk/cream) Powdered fruit flavored drinks Coffee (sugar ok, no milk/cream) Gatorade Juice: apple, white grape, white cranberry  Lemonade Clear bullion, consomm, broth Carbonated beverages (any kind) Strained chicken noodle soup Hard Candy   MEDICATION INSTRUCTIONS  Unless otherwise instructed, you should take regular prescription medications with a small sip of water as early as possible the morning of your procedure.                 OTHER INSTRUCTIONS  You will need a responsible adult at least 27 years of age to accompany you and drive you home.   This person must remain in the waiting room during your procedure.  Wear loose fitting clothing that is easily removed.  Leave jewelry and other valuables at home.  However, you may wish to bring a book to read or an iPod/MP3 player to listen to music as you wait for your procedure to start.  Remove all body piercing jewelry and leave at home.  Total time from sign-in until discharge is approximately 2-3  hours.  You should go home directly after your procedure and rest.  You can resume normal activities the day after your procedure.  The day of your procedure you should not:   Drive   Make legal decisions   Operate machinery   Drink alcohol   Return to work  You will receive specific instructions about eating, activities and medications before you leave.    The above instructions have been reviewed and explained to me by   Lamona Curl CMA [facility])  August 04, 2010 11:14 AM     I fully understand and can verbalize these instructions _____________________________ Date 08/04/10

## 2010-12-02 NOTE — Assessment & Plan Note (Signed)
Summary: stomach pain/kdc   Vital Signs:  Patient profile:   25 year old male Temp:     97.5 degrees F oral Pulse rate:   64 / minute BP sitting:   112 / 70  (left arm)  Vitals Entered By: Doristine Devoid (February 06, 2010 2:10 PM) CC: xyest RLQ little appetite no NVD   History of Present Illness: 25 yo man here today w/ abd pain.  pain located around baclofen pump in RLQ.  pt also has G tube.  pain started Monday and has been worsening.  pain is not superficial like the pump but deep.  decreased appetite.  no nausea or vomiting.  no diarrhea- last BM 2 days ago (normal for pt).  pain w/ putting water in Gtube this AM.  still has appendix.  no fever.  Current Medications (verified): 1)  Depakote Sprinkles 125 Mg Cpsp (Divalproex Sodium) .... Take 2 Capsule in Am and 4 By Mouth Qpm 2)  Paxil 20 Mg Tabs (Paroxetine Hcl) .Marland Kitchen.. 1 By Mouth Qd 3)  Repairs To Dynavox .... Per Work Buyer, retail Are Less Expensive Than Buying New Machine 4)  Zyprexa 2.5 Mg Tabs (Olanzapine) .Marland Kitchen.. 1 By Mouth At Bedtime 5)  Alprazolam 0.5 Mg  Tbdp (Alprazolam) .... Take One-Half To One Tablet As Needed For Agitation/anxiety. 6)  Carafate 1 Gm/74ml  Susp (Sucralfate) .... Use 2 Tsps Through Tube When Tube Is Bleeding. 7)  Power Economist .... Dispense One [employer] manager. 8)  Dynavox .Marland Kitchen.. 343.2 9)  Atenolol 25 Mg Tabs (Atenolol) .Marland Kitchen.. 1 By Mouth Two Times A Day As Needed 10)  A Pair of Deep Lateral Supports .... As Directed Dx Cerebral Palsy 11)  Valium 5 Mg Tabs (Diazepam) .Marland Kitchen.. 1 By Mouth Two Times A Day As Needed 12)  Doxycycline Monohydrate 100 Mg Tabs (Doxycycline Monohydrate) .Marland Kitchen.. 1 By Mouth Two Times A Day 13)  Retin-A 0.1 % Crea (Tretinoin) .... Apply At Bedtime 14)  Avelox 400 Mg Tabs (Moxifloxacin Hcl) .... One Via Tupe Daily X 1 Week 15)  Albuterol Sulfate (2.5 Mg/55ml) 0.083% Nebu (Albuterol Sulfate) .Marland Kitchen.. 1 Neb Every 6 Hours As Needed For Chest Congestion  Allergies (verified): 1)  ! Sulfa  Review of  Systems      See HPI  Physical Exam  General:  alert.  wheelchair-bound Lungs:  no respiratory distress or increased work of breathing. CTA anteriorly.  exam limited due to wheel chair Heart:  normal rate and no murmur.   Abdomen:  soft, TTP over RLQ.  + rebound, guarding.  + pain w/ bumping wheelchair   Impression & Recommendations:  Problem # 1:  ABDOMINAL PAIN, RIGHT LOWER QUADRANT (ICD-789.03) Assessment New strongly suspect appendicitis given location of pain, fact it is worsening, anorexia.  will get CT.  if (+) pt will go to [facility] ER.  mom in agreement w/ plan.  if CT (-) will prescribe pain medicine and follow closely. Orders: CT with & without Contrast (CT w&w/o Contrast)  Complete Medication List: 1)  Depakote Sprinkles 125 Mg Cpsp (Divalproex sodium) .... Take 2 capsule in am and 4 by mouth qpm 2)  Paxil 20 Mg Tabs (Paroxetine hcl) .Marland Kitchen.. 1 by mouth qd 3)  Repairs To Dynavox  .... Per work order repairs are less expensive than buying new machine 4)  Zyprexa 2.5 Mg Tabs (Olanzapine) .Marland Kitchen.. 1 by mouth at bedtime 5)  Alprazolam 0.5 Mg Tbdp (Alprazolam) .... Take one-half to one tablet as needed for agitation/anxiety.  6)  Carafate 1 Gm/28ml Susp (Sucralfate) .... Use 2 tsps through tube when tube is bleeding. 7)  Power Economist  .... Dispense one power wheel chair. 8)  Dynavox  .Marland Kitchen.. 343.2 9)  Atenolol 25 Mg Tabs (Atenolol) .Marland Kitchen.. 1 by mouth two times a day as needed 10)  A Pair of Deep Lateral Supports  .... As directed dx cerebral palsy 11)  Valium 5 Mg Tabs (Diazepam) .Marland Kitchen.. 1 by mouth two times a day as needed 12)  Doxycycline Monohydrate 100 Mg Tabs (Doxycycline monohydrate) .Marland Kitchen.. 1 by mouth two times a day 13)  Retin-a 0.1 % Crea (Tretinoin) .... Apply at bedtime 14)  Avelox 400 Mg Tabs (Moxifloxacin hcl) .... One via tupe daily x 1 week 15)  Albuterol Sulfate (2.5 Mg/65ml) 0.083% Nebu (Albuterol sulfate) .Marland Kitchen.. 1 neb every 6 hours as needed for chest congestion  Patient  Instructions: 1)  Please schedule a follow-up appointment as needed. 2)  Your CT scan [facility] at the [facility] at 4pm- they will call me with the results and I will contact you with our plan 3)  Call with any questions or concerns 4)  Hang in there!!  Appended Document: stomach pain/kdc     Clinical Lists Changes  Orders: Added new Test order of CT with Contrast (CT w/ contrast) - Signed

## 2010-12-02 NOTE — Letter (Signed)
Summary: Patient Notice-Endo Biopsy Results  [facility]  [address] [place], [place] [address]   Phone: [phone]  Fax: [phone]        September 19, 2010 MRN: [id number]    [name] [address] [place], [place]  [address]    Dear Mr. MULDROW,  I am pleased to inform you that the biopsies taken during your recent endoscopic examination did not show any evidence of cancer upon pathologic examination.The biopsies confirn inflammation due to acid reflux.  Additional information/recommendations:  __No further action is needed at this time.  Please follow-up with      your primary care physician for your other healthcare needs.  __ Please call [phone] to schedule a return visit to review      your condition.  _x_ Continue with the treatment plan as outlined on the day of your     exam. Nexium 40 mg twice a day  __ You should have a repeat endoscopic examination for this problem              in _ months/years.   Please call us if you are having persistent problems or have questions about your condition that have not been fully answered at this time.  Sincerely,  Hart Carwin MD  This letter has been electronically signed by your physician.  Appended Document: Patient Notice-Endo Biopsy Results Letter mailed to patient's home.

## 2010-12-02 NOTE — Discharge Summary (Signed)
[name], [name]               ACCOUNT NO.:  [id number]  MEDICAL RECORD NO.:  [id number]          PATIENT TYPE:  INP  LOCATION:  4506                         FACILITY:  [facility]  PHYSICIAN:  Chad Ranger, MD       DATE OF BIRTH:  1985/11/25  DATE OF ADMISSION:  11/27/2010 DATE OF DISCHARGE:                              DISCHARGE SUMMARY   DISCHARGE DIAGNOSIS: 1. Aspiration pneumonitis. 2. Nausea and vomiting secondary to ileus resolved. 3. Gastroesophageal reflux disease. 4. Mood disorder. 5. Anemia. 6. History of cerebral palsy with mental retardation. 7. 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 8. Neurogenic bladder. 9. History of hyperthyroidism. 10.Kyphosis status post spinal fusion at [facility] on November 20, 2010.  DISCHARGE MEDICATIONS: 1. Albuterol nebs 2.5 mg every 6 hours as needed. 2. Diazepam 10 mg p.o. daily at bedtime. 3. Robitussin every 4 hours as needed for cough. 4. Jevity 40 mL an hour q.24 h. 5. Avelox 400 mg via tube daily for another 5 days. 6. Reglan 5 mg via tube every 8 hours as needed for nausea or tube     feeds residual more than 100 mL. 7. Tylenol 500 mg 1-2 tablets via tube every 6 hours as needed. 8. Atenolol 25 mg via tube daily. 9. Depakote 125 mg 2 caps in a.m. and 4 caps in p.m. twice daily. 10.Diazepam 5 mg every 6 hours as needed. 11.Roxicodone 5 mg via tube every 4 hours as needed for pain. 12.Paroxetine 20 mg p.o. via tube daily at bedtime. 13.Zyprexa 5 mg via tube daily at bedtime.  BRIEF HISTORY OF PRESENT ILLNESS AT THE TIME OF ADMISSION:  Mr. Fuhrmann is a 25 year old male with past medical history of cerebral palsy and kyphosis who underwent spinal fusion at [facility] on November 20, 2010.  The patient developed postoperative aspiration pneumonia and was treated with vancomycin and Zosyn and ultimately discharged home a day before the admission.  While at home, the  patient continued to spike fevers and subsequently developed an episode of vomiting of his tube feedings on the day of admission.  The patient was evaluated by the emergency room and found to be febrile with a documented temperature of 101.  An abdominal film showing a mild ileus.  RADIOLOGICAL DATA:  Chest x-ray 2 view January 26, mild posterior lower lobe atelectasis versus airspace disease, pneumonia not excluded. Abdominal x-ray January 26, nonspecific, nonobstructive bowel gas pattern, question mild ileus.  Abdominal x-ray on January 27 showed nonobstructive bowel gas pattern.  No free air.  Chest x-ray January 29, no definitive pneumonia and possible mild atelectasis and/or small effusion at the right lung base.  Recommend 2 view chest x-ray if possible.  BRIEF HOSPITALIZATION COURSE: 1. Aspiration pneumonitis.  The patient was admitted and was placed on     aspiration precautions.  Vancomycin, Zosyn, and Avelox was     restarted.  Tube feeds were held given his postoperative ileus.     Blood cultures were obtained which remained negative till date.     Repeat chest x-ray on  January 29 showed improvement in the     pneumonitis.  Likely the patient had aspiration pneumonitis     secondary to aspiration of the tube feedings. 2. Nausea and vomiting secondary to ileus.  The ileus was resolved on     the abdominal x-ray, repeat obtained next day.  The patient's tube     feeds were held and nutrition consult was obtained.  He was started     on the tube feeds albeit at much slower rate.  He was according to     the family at 90 mL an hour during the previous hospitalization     which likely led to the ileus.  He is currently tolerating 40 mL an     hour. 3. GERD.  Continue on PPI. 4. Anemia, unclear if this was postoperative.  The patient, however,     received 1 unit of packed RBC transfusion and at the time of     discharge, the hemoglobin is improved to 8.5.  The patient will be      discharged home today.  DISCHARGE PHYSICAL EXAMINATION:  VITAL SIGNS AT THE TIME OF DISCHARGE: BP 103/57, temperature 98.9, pulse 100, respirations 20, O2 sats 96% on room air. GENERAL: The patient is alert and awake and mental status at his baseline. HEENT:  Normocephalic, atraumatic.  Equally responding to light and accommodation.  EOMI. NECK:  Supple.  No lymphadenopathy.  No JVD. CHEST:  Fairly clear to auscultation bilaterally. HEART:  Tachycardiac, however, no murmur, rubs, or gallops. ABDOMEN:  Soft, nontender, nondistended.  Normoactive bowel sounds.  PEG tube noted in the left upper quadrant. EXTREMITIES:  Foot drop bilaterally.  No clubbing or edema. NEUROLOGICAL:  Currently at the baseline, alert and awake.  DISCHARGE TIME:  35 minutes.     Chad Ranger, MD     RR/MEDQ  D:  12/01/2010  T:  12/01/2010  Job:  [id number]  cc:   Lelon Perla, DO  Electronically Signed by Andres Labrum RAI  on 12/02/2010 04:52:14 PM

## 2010-12-02 NOTE — Procedures (Signed)
Summary: EGD   EGD  Procedure date:  04/22/2006  Findings:      Location: [facility]   Patient Name: [name], [name] MRN: [id number] Procedure Procedures: Panendoscopy (EGD) CPT: 43235.    with biopsy(s)/brushing(s). CPT: D1846139.  Personnel: Endoscopist: Dora L. Juanda Chance, MD.  Exam Location: Exam performed in Endoscopy Suite.  Patient Consent: Procedure, Alternatives, Risks and Benefits discussed, consent obtained, from patient. Consent was obtained by the RN.  Indications Symptoms: Abdominal pain, location: epigastric. Reflux symptoms  History  Current Medications: Patient is not currently taking Coumadin.  Pre-Exam Physical: Performed Apr 22, 2006  Cardio-pulmonary exam, HEENT exam WNL. Abdominal exam, Extremity exam, Neurological exam, Mental status exam abnormal. Abnormal PE findings include: see chart.  Comments: Pt. history reviewed/updated, physical exam performed prior to initiation of sedation? Exam Exam Info: Maximum depth of insertion Duodenum, intended Duodenum. Vocal cords visualized. Gastric retroflexion performed. Images taken. ASA Classification: III. Tolerance: good.  Sedation Meds: Patient assessed and found to be appropriate for moderate (conscious) sedation. Fentanyl 100 mcg. given IV. Versed 6 mg. given IV. Cetacaine Spray 2 sprays given aerosolized.  Monitoring: BP and pulse monitoring done. Oximetry used. Supplemental O2 given  Fluoroscopy: Fluoroscopy was not used.  Findings - OTHER FINDING: wide open reflux in Proximal Esophagus.  OTHER FINDING: functioning PEG in Body.  Normal: Duodenal 2nd Portion.   Assessment Abnormal examination, see findings above.  Comments: GERD, PEG Events  Unplanned Intervention: No unplanned interventions were required.  Unplanned Events: There were no complications. Plans Comments: see chart for recommendations Disposition: After procedure patient sent to recovery.   This report was created  from the original endoscopy report, which was reviewed and signed by the above listed endoscopist.

## 2010-12-02 NOTE — Miscellaneous (Signed)
Summary: [facility]  [facility]   Imported By: Charolette Child 11/30/2007 12:02:03  _____________________________________________________________________  External Attachment:    Type:   Image     Comment:   External Document

## 2010-12-02 NOTE — Miscellaneous (Signed)
Summary: Referral/[facility]  Referral/[facility]   Imported By: Lanelle Bal 08/04/2010 13:14:45  _____________________________________________________________________  External Attachment:    Type:   Image     Comment:   External Document

## 2010-12-02 NOTE — Miscellaneous (Signed)
Summary: Home Care Report  Home Care Report   Imported By: Freddy Jaksch 04/27/2007 11:22:36  _____________________________________________________________________  External Attachment:    Type:   Image     Comment:   HOME HEALTH

## 2010-12-02 NOTE — Progress Notes (Signed)
Summary: MOM NEEDS TO TALK ABOUT [facility]  Phone Note Call from Patient Call back at [facility] Phone [phone]   Caller: Patient Summary of Call: PATIENT'S MOM LINDA CALLED BACK--NEEDS TO TALK TO KIM AGAIN ABOUT [facility]  PLEASE CALL [phone] Initial call taken by: Jerolyn Shin,  July 11, 2010 4:30 PM  Follow-up for Phone Call        mom called and request [facility], I tried to arrange and was advised that they only coner orthopeadics for [employer] pt's. Mother voiced understanding. Almeta Monas CMA [facility])  July 11, 2010 5:05 PM

## 2010-12-02 NOTE — Progress Notes (Signed)
Summary: CXR request  Phone Note Call from Patient Call back at Home Phone [phone]   Summary of Call: Patient mother left message on triage that the patient appt with Marchelle Gearing has been r/s to 11/11. She would like to know if the patient should have another CXR prior to that appt due to recent aspiration/pneumo. Please advise.  She also mentioned patient g-tube, but I see that she has called GI regarding that issue. Initial call taken by: Lucious Groves CMA,  September 02, 2010 11:02 AM  Follow-up for Phone Call        only if pt is having symptoms Follow-up by: Loreen Freud DO,  September 02, 2010 12:04 PM  Additional Follow-up for Phone Call Additional follow up Details #1::        Discuss with patient mom............Marland KitchenFelecia Deloach CMA  September 03, 2010 9:24 AM

## 2010-12-02 NOTE — Letter (Signed)
Summary: Results Follow-up Letter  [facility]  [address] [place], [place] [address]   Phone: [phone]  Fax: [phone]    10/11/2008        Latham Kinzler [address] [place], [place]  [address]  Dear Mr. BABY,   The following are the results of your recent test(s):  Test     Result     Pap Smear    Normal_______  Not Normal_____       Comments: _________________________________________________________ Cholesterol LDL(Bad cholesterol):          Your goal is less than:         HDL (Good cholesterol):        Your goal is more than: _________________________________________________________ Other Tests:   _________________________________________________________  Please call for an appointment Or Please see attached labs._________________________________________________________ _________________________________________________________ _________________________________________________________  Sincerely,  Felecia Deloach CMA [name]

## 2010-12-02 NOTE — Letter (Signed)
[facility]   Imported By: Doristine Devoid 08/05/2007 16:52:07  _____________________________________________________________________  External Attachment:    Type:   Image     Comment:   External Document

## 2010-12-02 NOTE — Letter (Signed)
Summary: [facility]  [facility]   Imported By: Lanelle Bal 08/14/2010 13:53:46  _____________________________________________________________________  External Attachment:    Type:   Image     Comment:   External Document

## 2010-12-02 NOTE — Progress Notes (Signed)
Summary: foot no better  Phone Note Call from Patient Call back at Home Phone [phone] Call back at [phone] cell   Caller: Mom Summary of Call: Pt mom called back stating that swelling in foot increase last night and the bruising is still present on ankle and toes. pt mother would like to have a x-ray of foot. pt was seen for this on 10-29-09. dr Zuha Dejonge pls advise ok to order x-ray..............Marland KitchenFelecia Deloach CMA  November 06, 2009 12:53 PM   Follow-up for Phone Call        please order a LEFT  foot and ankle  X-ray if the area becomes red, warm-- needs a OV [facility] Recupero E. Haidynn Almendarez MD  November 06, 2009 2:00 PM    Additional Follow-up for Phone Call Additional follow up Details #1::        pt mother aware.................Marland KitchenFelecia Deloach CMA  November 06, 2009 2:54 PM   New Problems: EDEMA, ANKLES (ICD-782.3)   New Problems: EDEMA, ANKLES (ICD-782.3)

## 2010-12-02 NOTE — Consult Note (Signed)
Summary: [facility]   Imported By: Lanelle Bal 03/07/2010 10:23:24  _____________________________________________________________________  External Attachment:    Type:   Image     Comment:   External Document

## 2010-12-02 NOTE — Procedures (Signed)
Summary: Upper Endoscopy  Patient: [name] Note: All result statuses are Final unless otherwise noted.  Tests: (1) Upper Endoscopy (EGD)   EGD Upper Endoscopy       DONE     [facility]     [address] [place], [place]  [address]           ENDOSCOPY PROCEDURE REPORT           PATIENT:  Chad, [name]  MR#:  [id number]     BIRTHDATE:  10-Jan-1986, 24 yrs. old  GENDER:  male           ENDOSCOPIST:  Hedwig Morton. Juanda Chance, MD     Referred by:  Loreen Freud, DO           PROCEDURE DATE:  09/18/2010     PROCEDURE:  EGD with foreign body removal, EGD with PEG placement     ASA CLASS:  Class III     INDICATIONS:  pt with CP, PEG since 2006, cannot be removed     percutaneously, needs to be replaced due to malfunction           MEDICATIONS:   MAC sedation, administered by CRNA     TOPICAL ANESTHETIC:  none           DESCRIPTION OF PROCEDURE:   After the risks benefits and     alternatives of the procedure were thoroughly explained, informed     consent was obtained.  The  endoscope was introduced through the     mouth and advanced to the second portion of the duodenum, without     limitations.  The instrument was slowly withdrawn as the mucosa     was fully examined.     <<PROCEDUREIMAGES>>           Mild gastritis was found (see image6). erosions at the opposite     wall from gastrostomy T-bumper, the PEG in the antrum, unable to     pull out prcutaneously,     PEG removed after cutting the tubing at the t-bumper end and     placing a large snare, then gently pulled out through the     esophagus  Esophagitis was found in the distal esophagus. With     standard forceps, a biopsy was obtained and sent to pathology (see     image1, image2, and image3). Grade I reflux esophagitis  Otherwise     the examination was normal. Percutaneous endoscopic gastrostomy     tube was placed using standard technique (see image10, image9,     image8, and image7). 4F BS Push  gastrostomy placed via existing     track    Retroflexed views revealed no abnormalities.    The scope     was then withdrawn from the patient and the procedure completed.           COMPLICATIONS:  None           ENDOSCOPIC IMPRESSION:     1) Mild gastritis     2) Esophagitis in the distal esophagus     3) Otherwise normal examination     removal of old gastrostomy endoscopically     placement of 4FBS PUSH gastrostomy     RECOMMENDATIONS:     1) Anti-reflux regimen to be follow     continue Nexiem and Carafate slurry, resume oral feedingd when     alert  REPEAT EXAM:  In 1 year(s) for.           ______________________________     Hedwig Morton. Juanda Chance, MD           CC:           n.     eSIGNED:   Hedwig Morton. Aury Scollard at 09/18/2010 01:10 PM           Keturah Shavers, [id number]  Note: An exclamation mark (!) indicates a result that was not dispersed into the flowsheet. Document Creation Date: 09/18/2010 1:10 PM _______________________________________________________________________  (1) Order result status: Final Collection or observation date-time: 09/18/2010 12:59 Requested date-time:  Receipt date-time:  Reported date-time:  Referring Physician:   Ordering Physician: [name] [phone]) Specimen Source:  Source: Launa Grill Order Number: [id number] Lab site:

## 2010-12-02 NOTE — Assessment & Plan Note (Signed)
Summary: black around feeding tube..em    History of Present Illness Visit Type: Follow-up Visit Primary GI MD: [name] MD Primary Lizet Kelso: Loreen Freud, DO  Requesting Sharece Fleischhacker: na Chief Complaint: black around feeding tube  History of Present Illness:   This is a 25 year old white male with cerebral palsy who is wheelchair-bound. He has gastroesophageal reflux, vesiculo- ureteral reflux and is status post recent spinal fusion at [facility]  for thoracic kyphosis. He is status post surgical release of contractures in 1989 and intrathecal baclofen infusion. He had surgical placement of a percutaneous gastrostomy in August 2006 using a Kangaroo Dobhoff PEG. This PEG cannot be removed percutaneously since it is harbored with sutures. He has a borderline impaired gastric emptying based on his gastric emptying scan in 2007 having 33% retention in 120 minutes. An upper GI series in April 2008 showed gastroesophageal reflux.  He has had occasional coughing while eating. The tube is being used only for liquids and medications. It has a build up of brown and yellowish material around the tube which leaks around the gastrostomy. The tubing has been worn out. A modified barium swallow in May 2010 showed moderately severe oral dysphagia and mild to moderate pharyngeal dysphagia. There were no diet restrictions  except for aspiration precautions and eating solids and liquids separately.   GI Review of Systems      Denies abdominal pain, acid reflux, belching, bloating, chest pain, dysphagia with liquids, dysphagia with solids, heartburn, loss of appetite, nausea, vomiting, vomiting blood, weight loss, and  weight gain.        Denies anal fissure, black tarry stools, change in bowel habit, constipation, diarrhea, diverticulosis, fecal incontinence, heme positive stool, hemorrhoids, irritable bowel syndrome, jaundice, light color stool, liver problems, rectal bleeding, and  rectal pain.    Current Medications  (verified): 1)  Depakote Sprinkles 125 Mg Cpsp (Divalproex Sodium) .... Take 2 Capsule in The Am and 4 Capsules Qpm Via G Tube 2)  Paxil 20 Mg Tabs (Paroxetine Hcl) .Marland Kitchen.. 1 By Mouth Qd 3)  Zyprexa 5 Mg Tabs (Olanzapine) .Marland Kitchen.. 1 By Mouth At Bedtime 4)  Alprazolam 0.5 Mg  Tbdp (Alprazolam) .... Take One-Half To One Tablet As Needed For Agitation/anxiety. 5)  Carafate 1 Gm/52ml  Susp (Sucralfate) .... Use 2 Tsps Through Tube When Tube Is Bleeding. 6)  Power Economist .... Dispense One Heritage manager. 7)  Dynavox .Marland Kitchen.. 343.2 8)  A Pair of Deep Lateral Supports .... As Directed Dx Cerebral Palsy 9)  Valium 5 Mg Tabs (Diazepam) .Marland Kitchen.. 1 By Mouth Two Times A Day As Needed 10)  Doxycycline Monohydrate 100 Mg Tabs (Doxycycline Monohydrate) .Marland Kitchen.. 1 By Mouth Once Daily 11)  Retin-A 0.1 % Crea (Tretinoin) .... Apply At Bedtime 12)  Albuterol Sulfate (2.5 Mg/53ml) 0.083% Nebu (Albuterol Sulfate) .Marland Kitchen.. 1 Neb Every 6 Hours As Needed For Chest Congestion 13)  Oxycodone Hcl 5 Mg Tabs (Oxycodone Hcl) .Marland Kitchen.. 1-2 Tablets Every 4-6 Hours  Allergies (verified): 1)  ! Sulfa 2)  ! Bactrim  Past History:  Past Medical History: CP grade 3 kidney reflux Neurogenic bladder/ Trabeculated bladder G tube Bipolar disorder Neuromuscular scoliosis  ANXIETY (ICD-300.00) DEPRESSION (ICD-311) HYPERTHYROIDISM (ICD-242.90) ESOPHAGITIS, HX OF (ICD-V12.79) GERD (ICD-530.81) ASPIRATION PNEUMONIA (ICD-507.0) PREVENTIVE HEALTH CARE (ICD-V70.0) ABDOMINAL PAIN, RIGHT LOWER QUADRANT (ICD-789.03) EDEMA, ANKLES (ICD-782.3) OTHER ACNE (ICD-706.1) PALPITATIONS (ICD-785.1) GENERALIZED ANXIETY DISORDER (ICD-300.02) COUGH (ICD-786.2) HIP PAIN, BILATERAL (ICD-719.45) NEVI, MULTIPLE (ICD-216.9) ABDOMINAL PAIN OTHER SPECIFIED SITE (ICD-789.09) DYSURIA (ICD-788.1) PREOPERATIVE EXAMINATION (ICD-V72.84) UNSPECIFIED PRE-OPERATIVE  EXAMINATION (ICD-V72.84) FAMILY HISTORY BREAST CANCER 1ST DEGREE RELATIVE <50 (ICD-V16.3) FAMILY HISTORY  OF ASTHMA (ICD-V17.5) DYSPHAGIA UNSPECIFIED (ICD-787.20) INCONTINENCE OF FECES (ICD-787.6) UNSPECIFIED URINARY INCONTINENCE (ICD-788.30) 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 g tube s/p Baclofen pump with revision x 1 s/p PSF T4-L4 03/1999 Hamstring release x 2 Spinal Fusion Toe Surgery x 2 Eye Surgery x 2 Tonsillectomy  Family History: Reviewed history from 08/01/2010 and no changes required. Family History of Asthma Family History Breast cancer : Maternal Grandmother Family History of Prostate CA: Maternal Grandfather Family History of Colon Cancer: Great Grandmother Family History of Colon Polyps: Father Family History of Heart Disease: Paternal Grandfather  Social History: Reviewed history from 05/21/2008 and no changes required. Single Never Smoked Alcohol use-no Drug use-no Lives with parents wheelchair bound  Review of Systems  The patient denies allergy/sinus, anemia, anxiety-new, arthritis/joint pain, back pain, blood in urine, breast changes/lumps, change in vision, confusion, cough, coughing up blood, depression-new, fainting, fatigue, fever, headaches-new, hearing problems, heart murmur, heart rhythm changes, itching, menstrual pain, muscle pains/cramps, night sweats, nosebleeds, pregnancy symptoms, shortness of breath, skin rash, sleeping problems, sore throat, swelling of feet/legs, swollen lymph glands, thirst - excessive , urination - excessive , urination changes/pain, urine leakage, vision changes, and voice change.         Pertinent positive and negative review of systems were noted in the above HPI. All other ROS was otherwise negative.   Vital Signs:  Patient profile:   25 year old male Height:      63 inches Pulse  rate:   88 / minute Pulse rhythm:   regular BP sitting:   124 / 68  (left arm)  Vitals Entered By: Ok Anis CMA (August 04, 2010 10:39 AM)  Physical Exam  General:  patient not communicative. Using a wheelchair. Unable to verbalize. Eyes:  PERRLA, no icterus. Mouth:  no thrush. Neck:  Supple; no masses or thyromegaly. Chest Wall:  chest deformity with recent scar over the cervical spine from a fusion. Lungs:  no wheezes or rales. Heart:  Regular rate and rhythm; no murmurs, rubs,  or bruits. Abdomen:  24 French percutaneous gastrostomy in left upper quadrant with a large amount of debris and brownish secretions. The 4 x 4 pad was soaked. There is no blood. The tubing appears to be worn out and discolored. The adapter has been taped. Extremities:  deformities of the upper and lower extremities due to cerebral palsy. Psych:  nonverbal.   Impression & Recommendations:  Problem # 1:  GERD (ICD-530.81) Patient has chronic gastroesophageal reflux. Patient has been on aspiration precautions. We will start him on Zegerid 40 mg in powder form through his gastrostomy daily. Orders: ZPEG Change (ZPEG Change)  Problem # 2:  ASPIRATION PNEUMONIA (ICD-507.0) Patient has recent aspiration pneumonia following surgery for cervical spine fusion. He is doing fine now on oral feedings but may need a repeat modified barium swallow to assess his swallowing ability.  Problem # 3:  FEEDING PROBLEM (ICD-783.3) Patient has a poorly functioning gastrostomy which needs to be changed. We will schedule change this under propofol sedation. He will have placement of an original 24 [place] push gastrostomy. Until then, he will continue strict antireflux measures and Carafate slurry 10 cc twice a day by mouth.  Problem # 4:  CEREBRAL PALSY (ICD-343.9) severe  musculoskeletal  restrictions, speech impairment and cognitive deficits.Marland Kitchen He is wheelchair-bound  Patient Instructions: 1)  We have scheduled you to have  your PEG change with Propofol at [facility] on 08/14/10. You should arrive at 1:00 pm for registration at Outpatient registration. 2)  We will use a Occupational hygienist [place] Push Gastrostomy tube. 3)  Please pick up Zegerid packs at the pharmacy. You should take 1 pack via PEG once daily. 4)  Pick up carafate at the pharmacy. You should take 10 cc (2 teaspoons) by mouth two times a day. 5)  Copy sent to : Loreen Freud, DO 6)  The medication list was reviewed and reconciled.  All changed / newly prescribed medications were explained.  A complete medication list was provided to the patient / caregiver. Prescriptions: CARAFATE 1 GM/10ML SUSP (SUCRALFATE) Take 10 cc by mouth two times a day  #12 ounces x 3   Entered by:   Lamona Curl CMA (AAMA)   Authorized by:   Hart Carwin MD   Signed by:   Lamona Curl CMA (AAMA) on 08/04/2010   Method used:   Electronically to        [facility]  [address] [id number]       [address]       [place], [place]  [address]       Ph: [phone] or [phone]       Fax: [phone]   RxID:   [id number] ZEGERID 40-1680 MG PACK (OMEPRAZOLE-SODIUM BICARBONATE) Take 1 pack via peg once daily  #30 x 2   Entered by:   Lamona Curl CMA (AAMA)   Authorized by:   Hart Carwin MD   Signed by:   Lamona Curl CMA (AAMA) on 08/04/2010   Method used:   [facility]        [facility]  [address] [id number]       [address]       [place], [place]  [address]       Ph: [phone] or [phone]       Fax: [phone]   RxID:   [id number]   Appended Document: black around feeding tube.Marland Kitchenem ---- Converted from flag ---- ---- 08/05/2010 3:54 PM, Hart Carwin MD wrote: Karen Kitchens, Dr Laury Axon says pt has pleural effusions. She is sending him to Pulmonary. Please cancel PEG replacement, reschedule pending clearance from  Pulmonary. ------------------------------  PEG replacement cancelled for now. [name], Bonita Quin advised and Clydie Braun in Endo advised to cancel procedure.

## 2010-12-02 NOTE — Letter (Signed)
Summary: CMN & Prescription for Speech Device/[place] [facility]   CMN & Prescription for Speech Device/[place] [facility]   Imported By: Lanelle Bal 11/20/2008 09:04:02  _____________________________________________________________________  External Attachment:    Type:   Image     Comment:   External Document

## 2010-12-02 NOTE — Progress Notes (Signed)
Summary: new rx for mattress  Phone Note Call from Patient Call back at Home Phone [phone]   Caller: Mom Summary of Call: patient mom left msg on voicemail needs to get new prescription for mattress similar to what he had in the hospital one that has alternating pressure fax new prescription and [facility] will come and switch out beds. Initial call taken by: Doristine Devoid CMA,  July 18, 2010 9:02 AM    New/Updated Medications: * HOSPITAL BED WITH AIR MATTRESS AND ALTERNATING AIR PRESSURE dx  507,  343.2 Prescriptions: HOSPITAL BED WITH AIR MATTRESS AND ALTERNATING AIR PRESSURE dx  507,  343.2  #1 x 0   Entered by:   Doristine Devoid CMA   Authorized by:   Loreen Freud DO   Signed by:   Doristine Devoid CMA on 07/18/2010   Method used:   Printed then faxed to ...         RxID:   [id number]

## 2010-12-02 NOTE — Assessment & Plan Note (Signed)
Summary: FOLLOW UP ON HIS MED//PH   Vital Signs:  Patient profile:   25 year old male Temp:     98.3 degrees F oral Pulse rate:   82 / minute Pulse rhythm:   regular BP sitting:   106 / 78  (left arm) Cuff size:   regular  Vitals Entered By: Army Fossa CMA (August 15, 2009 1:50 PM) CC: follow up on medicaitons 3   History of Present Illness: [name] here for f/u on meds and refills.  [name] has been to Neuropsychiatry and [place] [facility].  Surgery was postponed because [name] doing so well off baclofen pump.    Allergies: 1)  ! Sulfa  Past History:  Past Medical History: Last updated: 02/08/2007 CP grade 3 kidney reflux Neurogenic bladder/ Trabeculated bladder G tube Bipolar disorder Neuromuscular scoliosis  Past Surgical History: Last updated: 02/08/2007 g tube s/p Baclofen pump s/p PSF T4-L4 03/1999  Family History: Last updated: 05/21/2008 Family History of Asthma Family History Breast cancer  Family History of Prostate CA  Social History: Last updated: 05/21/2008 Single Never Smoked Alcohol use-no Drug use-no Lives with parents wheelchair bound  Risk Factors: Smoking Status: never (05/21/2008)  Family History: Reviewed history from 05/21/2008 and no changes required. Family History of Asthma Family History Breast cancer  Family History of Prostate CA  Social History: Reviewed history from 05/21/2008 and no changes required. Single Never Smoked Alcohol use-no Drug use-no Lives with parents wheelchair bound  Review of Systems      See HPI  Physical Exam  General:  alert, well-nourished, and well-hydrated.   Head:  Normocephalic and atraumatic without obvious abnormalities. No apparent alopecia or balding. Mouth:  Oral mucosa and oropharynx without lesions or exudates.  Teeth in good repair. Neck:  No deformities, masses, or tenderness noted. Lungs:  Normal respiratory effort, chest expands symmetrically. Lungs are clear to auscultation, no  crackles or wheezes. Heart:  normal rate and no murmur.   Neurologic:  in wheelchair, neg tics / tremors Skin:  Intact without suspicious lesions or rashes Psych:  Oriented X3 and normally interactive.     Impression & Recommendations:  Problem # 1:  GENERALIZED ANXIETY DISORDER (ICD-300.02)  His updated medication list for this problem includes:    Paxil 20 Mg Tabs (Paroxetine hcl) .Marland Kitchen... 1 by mouth qd    Alprazolam 0.5 Mg Tbdp (Alprazolam) .Marland Kitchen... Take one-half to one tablet as needed for agitation/anxiety.    Valium 5 Mg Tabs (Diazepam) .Marland Kitchen... As needed  Discussed medication use and relaxation techniques.   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 Take 1 capsule twice a day 2)  Paxil 20 Mg Tabs (Paroxetine hcl) .Marland Kitchen.. 1 by mouth qd 3)  Repairs To Dynavox  .... Per work order repairs are less expensive than buying new machine 4)  Zyprexa 2.5 Mg Tabs (Olanzapine) .Marland Kitchen.. 1 by mouth at bedtime 5)  Alprazolam 0.5 Mg Tbdp (Alprazolam) .... Take one-half to one tablet as needed for agitation/anxiety. 6)  Carafate 1 Gm/51ml Susp (Sucralfate) .... Use 2 tsps through tube when tube is bleeding. 7)  Power Economist  .... Dispense one power wheel chair. 8)  Dynavox  .Marland Kitchen.. 343.2 9)  Atenolol 25 Mg Tabs (Atenolol) .Marland Kitchen.. 1 by mouth two times a day as needed 10)  A Pair of Deep Lateral Supports  .... As directed dx cerebral palsy 11)  Valium 5 Mg Tabs (Diazepam) .Marland KitchenMarland KitchenMarland Kitchen  As needed  Other Orders: Pneumococcal Vaccine ([address]109) Admin 1st Vaccine (60454) Admin 1st Vaccine (09811) Flu Vaccine 72yrs + [phone]) Prescriptions: PAXIL 20 MG TABS (PAROXETINE HCL) 1 by mouth qd  #30 x 5   Entered and Authorized by:   Loreen Freud DO   Signed by:   Army Fossa CMA on 08/15/2009   Method used:   Historical   RxID:   [id number]    Immunization History:  Tetanus/Td Immunization History:     Tetanus/Td:  Td (06/19/2009)    Tetanus/Td:  Td (06/14/2002)  Immunizations Administered:  Pneumonia Vaccine:    Vaccine Type: Pneumovax    Site: right deltoid    Mfr: Merck    Dose: 0.5 ml    Route: IM    Given by: Army Fossa CMA    Exp. Date: 11/06/2010    Lot #: [id number]      ANTICOAGULATION RECORD  NEW REGIMEN & LAB RESULTS Regimen:   (no change)     Immunization History:  Tetanus/Td Immunization History:    Tetanus/Td:  td (06/19/2009)    Tetanus/Td:  td (06/14/2002)  Immunizations Administered:  Pneumonia Vaccine:    Vaccine Type: Pneumovax    Site: right deltoid    Mfr: Merck    Dose: 0.5 ml    Route: IM    Given by: Army Fossa CMA    Exp. Date: 11/06/2010    Lot #: [id number] Flu Vaccine Consent Questions     Do you have a history of severe allergic reactions to this vaccine? no    Any prior history of allergic reactions to egg and/or gelatin? no    Do you have a sensitivity to the preservative Thimersol? no    Do you have a past history of Guillan-Barre Syndrome? no    Do you currently have an acute febrile illness? no    Have you ever had a severe reaction to latex? no    Vaccine information given and explained to patient? yes    Are you currently pregnant? no    Lot Number:[id number]   Exp Date:05/01/2010   Site Given  Left Deltoid IM    Given by: Army Fossa CMA    Exp. Date: 11/06/2010    Lot #: [id number] .[id number]

## 2010-12-02 NOTE — Letter (Signed)
Summary: Information on [employer]  Information on [employer]   Imported By: Lanelle Bal 10/02/2009 11:01:02  _____________________________________________________________________  External Attachment:    Type:   Image     Comment:   External Document

## 2010-12-02 NOTE — Progress Notes (Signed)
      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

## 2010-12-02 NOTE — Assessment & Plan Note (Signed)
Summary: acute only uti--not himself//ph   Vital Signs:  Patient Profile:   25 Years Old Male Temp:     98.7 degrees F oral Pulse rate:   80 / minute Resp:     18 per minute BP sitting:   90 / 70  (left arm)  Pt. in pain?   no  Vitals Entered By: Jeremy Johann CMA (October 09, 2008 11:34 AM)                  Referred by:  Dr Elvina Sidle-- Kateri Mc PCP:  Laury Axon  Chief Complaint:  uti and acne.  History of Present Illness: Pt here with mom and aid c/o abd pain and acne. Mom is suspecting UTI.    Current Allergies (reviewed today): ! SULFA      Physical Exam  General:     Well-developed,well-nourished,in no acute distress; alert,appropriate and cooperative throughout examination Lungs:     Normal respiratory effort, chest expands symmetrically. Lungs are clear to auscultation, no crackles or wheezes. Abdomen:     Left up. quad tendernesssoft, no distention, no masses, and no guarding.   Skin:     mult moles Psych:     Oriented X3, memory intact for recent and remote, and normally interactive.      Impression & Recommendations:  Problem # 1:  DYSURIA (ICD-788.1)  His updated medication list for this problem includes:    Doxycycline Hyclate 100 Mg Tabs (Doxycycline hyclate) .Marland Kitchen... 1 by mouth two times a day  Orders: T-Culture, Urine ([address]-60454) Venipuncture (09811) TLB-BMP (Basic Metabolic Panel-BMET) (80048-METABOL) TLB-CBC Platelet - w/Differential (85025-CBCD) TLB-Hepatic/Liver Function Pnl (80076-HEPATIC)  Encouraged to push clear liquids, get enough rest, and take acetaminophen as needed. To be seen in 10 days if no improvement, sooner if worse.   Problem # 2:  ABDOMINAL PAIN OTHER SPECIFIED SITE (ICD-789.09)  Orders: Venipuncture (91478) TLB-BMP (Basic Metabolic Panel-BMET) (80048-METABOL) TLB-CBC Platelet - w/Differential (85025-CBCD) TLB-Hepatic/Liver Function Pnl (80076-HEPATIC) Discussed use of medications, application of heat or cold, and  exercises.   Problem # 3:  NEVI, MULTIPLE (ICD-216.9)  Orders: Dermatology Referral (Derma)   Complete Medication List: 1)  Depakote Sprinkles 125 Mg Cpsp (Divalproex sodium) .... Take 1 capsule twice a day 2)  Paxil 30 Mg Tabs (Paroxetine hcl) 3)  Repairs To Dynavox  .... Per work order repairs are less expensive than buying new machine 4)  Zyprexa 5 Mg Tabs (Olanzapine) .... Take one tablet each evening. 5)  Propranolol Hcl 10 Mg Tabs (Propranolol hcl) .... Tak eone tablet each day in the evening. 6)  Alprazolam 0.5 Mg Tbdp (Alprazolam) .... Take one-half to one tablet as needed for agitation/anxiety. 7)  Carafate 1 Gm/1ml Susp (Sucralfate) .... Use 2 tsps through tube when tube is bleeding. 8)  Baclofen Pump  .Marland KitchenMarland KitchenMarland Kitchen 140 mcg daily. 9)  Power Economist  .... Dispense one power wheel chair. 10)  Dynavox  .Marland Kitchen.. 343.2 11)  Doxycycline Hyclate 100 Mg Tabs (Doxycycline hyclate) .Marland Kitchen.. 1 by mouth two times a day    Prescriptions: DOXYCYCLINE HYCLATE 100 MG TABS (DOXYCYCLINE HYCLATE) 1 by mouth two times a day  #20 x 3   Entered and Authorized by:   Loreen Freud DO   Signed by:   Loreen Freud DO on 10/09/2008   Method used:   [facility] to        [facility]  [id number]       [address]       [facility]  [place], [place]  [address]       Ph: [phone] or [phone]       Fax: [phone]   RxID:   [id number]  ] Laboratory Results   Urine Tests   Date/Time Reported: October 09, 2008 11:52 AM  Routine Urinalysis   Color: yellow Appearance: Clear Glucose: negative   (Normal Range: Negative) Bilirubin: negative   (Normal Range: Negative) Ketone: negative   (Normal Range: Negative) Spec. Gravity: 1.025   (Normal Range: 1.003-1.035) Blood: negative   (Normal Range: Negative) pH: 5.0   (Normal Range: 5.0-8.0) Protein: negative   (Normal Range: Negative) Urobilinogen: 0.2   (Normal Range: 0-1) Nitrite: negative   (Normal Range:  Negative) Leukocyte Esterace: negative   (Normal Range: Negative)

## 2010-12-02 NOTE — Letter (Signed)
Summary: Augmentative Communication Eval Report/[facility]  Augmentative Communication Eval Report/[facility]   Imported By: Lanelle Bal 11/20/2008 09:01:20  _____________________________________________________________________  External Attachment:    Type:   Image     Comment:   External Document

## 2010-12-02 NOTE — Letter (Signed)
Summary: CMN for Incontinence Supplies/[facility]  CMN for Incontinence Supplies/[facility]   Imported By: Lanelle Bal 03/11/2010 12:52:44  _____________________________________________________________________  External Attachment:    Type:   Image     Comment:   External Document

## 2010-12-02 NOTE — Progress Notes (Signed)
Summary: Medication Concerns  Phone Note Refill Request Call back at Home Phone [phone] Message from:  Patient  Refills Requested: Medication #1:  ZYPREXA 2.5 MG TABS 1 by mouth at bedtime Patient's mom has some concerns about Zyprexa, not helping as much. ? if dose can be increased. Patient will have a spinal fusion on 06/23/10 and it would help if we can keep the anxiety down to keep his muscles relaxed.  [facility] [address], Dr.Lowne please Konrad Felix CMA  May 19, 2010 10:39 AM    Follow-up for Phone Call        can increase to 5 mg at bedtime #30  2 refills Follow-up by: Loreen Freud DO,  May 19, 2010 11:48 AM  Additional Follow-up for Phone Call Additional follow up Details #1::        pt mom aware................Marland KitchenFelecia Deloach CMA  May 19, 2010 1:43 PM     New/Updated Medications: ZYPREXA 5 MG TABS (OLANZAPINE) 1 by mouth at bedtime Prescriptions: ZYPREXA 5 MG TABS (OLANZAPINE) 1 by mouth at bedtime  #30 x 2   Entered by:   Jeremy Johann CMA   Authorized by:   Loreen Freud DO   Signed by:   Jeremy Johann CMA on 05/19/2010   Method used:   [facility] to ...       [facility]  [id number]       [address]       [place], [place]  [address]       Ph: [phone] or [phone]       Fax: [phone]   RxID:   [id number]

## 2010-12-02 NOTE — Assessment & Plan Note (Signed)
Summary: COUGH W/CONGESTION/RH......   Vital Signs:  Patient profile:   25 year old male O2 Sat:      97 % on Room air Temp:     98.1 degrees F oral BP sitting:   120 / 80  Vitals Entered By: Kandice Hams (October 29, 2009 2:19 PM)  O2 Flow:  Room air CC: cough,congestion fever last night with some sob. mother says pt rammed foot in wheel chair left foot swollen and bruised   History of Present Illness: 10 days, history of cough, last night.  He felt warm had some chills  andSOB  on and off. Mom gave him  one of her nebulization treatments, and that seemed to help  he also injured his left foot see above   Allergies: 1)  ! Sulfa  Past History:  Past Medical History: Reviewed history from 02/08/2007 and no changes required. CP grade 3 kidney reflux Neurogenic bladder/ Trabeculated bladder G tube Bipolar disorder Neuromuscular scoliosis  Past Surgical History: Reviewed history from 02/08/2007 and no changes required. g tube s/p Baclofen pump s/p PSF T4-L4 03/1999  Social History: Reviewed history from 05/21/2008 and no changes required. Single Never Smoked Alcohol use-no Drug use-no Lives with parents wheelchair bound  Review of Systems       no nausea, vomiting, diarrhea. Mom feels like [name] has secretions  in the chest, but the patient is unable to bring them up. His urine look a baseline  Physical Exam  General:  alert.  wheelchair-bound Ears:  R ear normal and L ear normal.   Mouth:  pharynx pink and moist.   Lungs:  no respiratory distress or increased work of breathing. Exam limited as the patient cannot cough on command and does not have a deep inspiration.  Exam, however, is normal Extremities:  L ankle  slightly  swollen, no red    Impression & Recommendations:  Problem # 1:  FEVER (ICD-780.60)  history of fever and cough. vital signs are stable.  The patient does not look toxic. neg  UA. start Avelox. two samples and  a  prescription Chest x-ray. see  instructions  Orders: UA Dipstick w/o Micro (manual) (91478) T-2 View CXR (71020TC)  Complete Medication List: 1)  Depakote Sprinkles 125 Mg Cpsp (Divalproex sodium) .... Take 2 capsule in am and 4 by mouth qpm 2)  Paxil 20 Mg Tabs (Paroxetine hcl) .Marland Kitchen.. 1 by mouth qd 3)  Repairs To Dynavox  .... Per work order repairs are less expensive than buying new machine 4)  Zyprexa 2.5 Mg Tabs (Olanzapine) .Marland Kitchen.. 1 by mouth at bedtime 5)  Alprazolam 0.5 Mg Tbdp (Alprazolam) .... Take one-half to one tablet as needed for agitation/anxiety. 6)  Carafate 1 Gm/69ml Susp (Sucralfate) .... Use 2 tsps through tube when tube is bleeding. 7)  Power Economist  .... Dispense one power wheel chair. 8)  Dynavox  .Marland Kitchen.. 343.2 9)  Atenolol 25 Mg Tabs (Atenolol) .Marland Kitchen.. 1 by mouth two times a day as needed 10)  A Pair of Deep Lateral Supports  .... As directed dx cerebral palsy 11)  Valium 5 Mg Tabs (Diazepam) .Marland Kitchen.. 1 by mouth two times a day as needed 12)  Doxycycline Monohydrate 100 Mg Tabs (Doxycycline monohydrate) .Marland Kitchen.. 1 by mouth two times a day 13)  Retin-a 0.1 % Crea (Tretinoin) .... Apply at bedtime 14)  Avelox 400 Mg Tabs (Moxifloxacin hcl) .... One via tupe daily x 1 week 15)  Albuterol Sulfate (2.5 Mg/67ml) 0.083% Nebu (Albuterol  sulfate) .Marland Kitchen.. 1 neb every 6 hours as needed for chest congestion  Patient Instructions: 1)  avelox 400 mg daily for one week. 2)  robitussin DM as needed for cough. 3)  lots of fluids. 4)  albuterol nebs as needed  5)  E. R. if symptoms worse, more chest congestion, high fever, or difficulty breathing Prescriptions: ALBUTEROL SULFATE (2.5 MG/3ML) 0.083% NEBU (ALBUTEROL SULFATE) 1 neb every 6 hours as needed for chest congestion  #1 month supp x 0   Entered and Authorized by:   Nolon Rod. Paz MD   Signed by:   Nolon Rod. Paz MD on 10/29/2009   Method used:   Print then Give to Patient   RxID:   [id number] AVELOX 400 MG TABS (MOXIFLOXACIN HCL) one  via tupe daily x 1 week  #7 x 0   Entered and Authorized by:   Nolon Rod. Paz MD   Signed by:   Nolon Rod. Paz MD on 10/29/2009   Method used:   Print then Give to Patient   RxID:   [id number]   Laboratory Results   Urine Tests    Routine Urinalysis   Glucose: negative   (Normal Range: Negative) Bilirubin: negative   (Normal Range: Negative) Ketone: negative   (Normal Range: Negative) Spec. Gravity: <1.005   (Normal Range: 1.003-1.035) Blood: negative   (Normal Range: Negative) pH: 8.5   (Normal Range: 5.0-8.0) Protein: >=300   (Normal Range: Negative) Urobilinogen: 0.2   (Normal Range: 0-1) Nitrite: negative   (Normal Range: Negative) Leukocyte Esterace: negative   (Normal Range: Negative)

## 2010-12-03 ENCOUNTER — Emergency Department (HOSPITAL_BASED_OUTPATIENT_CLINIC_OR_DEPARTMENT_OTHER): Payer: Managed Care, Other (non HMO)

## 2010-12-03 ENCOUNTER — Telehealth: Payer: Self-pay | Admitting: Family Medicine

## 2010-12-03 ENCOUNTER — Encounter (HOSPITAL_BASED_OUTPATIENT_CLINIC_OR_DEPARTMENT_OTHER): Payer: Self-pay | Admitting: Radiology

## 2010-12-03 ENCOUNTER — Encounter (INDEPENDENT_AMBULATORY_CARE_PROVIDER_SITE_OTHER): Payer: Self-pay | Admitting: *Deleted

## 2010-12-03 ENCOUNTER — Emergency Department (HOSPITAL_BASED_OUTPATIENT_CLINIC_OR_DEPARTMENT_OTHER)
Admission: EM | Admit: 2010-12-03 | Discharge: 2010-12-03 | Disposition: A | Discharge status: Home / Self Care | Payer: Managed Care, Other (non HMO) | Attending: Internal Medicine | Admitting: Internal Medicine

## 2010-12-03 DIAGNOSIS — K5900 Constipation, unspecified: Secondary | ICD-10-CM

## 2010-12-03 DIAGNOSIS — G809 Cerebral palsy, unspecified: Secondary | ICD-10-CM | POA: Insufficient documentation

## 2010-12-03 DIAGNOSIS — K219 Gastro-esophageal reflux disease without esophagitis: Secondary | ICD-10-CM | POA: Insufficient documentation

## 2010-12-03 DIAGNOSIS — F79 Unspecified intellectual disabilities: Secondary | ICD-10-CM

## 2010-12-03 HISTORY — DX: Cerebral palsy, unspecified: G80.9

## 2010-12-03 LAB — CULTURE, BLOOD (ROUTINE X 2)
Culture  Setup Time: 201201261514
Culture  Setup Time: 201201261514
Culture: NO GROWTH
Culture: NO GROWTH

## 2010-12-04 ENCOUNTER — Telehealth (INDEPENDENT_AMBULATORY_CARE_PROVIDER_SITE_OTHER): Payer: Self-pay | Admitting: *Deleted

## 2010-12-04 ENCOUNTER — Telehealth: Payer: Self-pay | Admitting: Internal Medicine

## 2010-12-04 ENCOUNTER — Telehealth: Payer: Self-pay | Admitting: Family Medicine

## 2010-12-04 NOTE — Progress Notes (Signed)
Summary: Product information for W/C repair--mailed 12/30  Phone Note Other Incoming   Caller: Product information FOR W/C REPAIR Summary of Call: Rcv'd W/C repair forms.... They have been signed by [facility], and mailed back in the self addressed envelope on 12/20.Marland KitchenMarland KitchenMarland KitchenCopy sent to be scanned.... Almeta Monas CMA Duncan Dull)  November 05, 2010 4:45 PM

## 2010-12-04 NOTE — Letter (Signed)
Summary: Surgical Clearance/[facility]  Surgical Clearance/[facility]   Imported By: Lanelle Bal 11/20/2010 13:56:49  _____________________________________________________________________  External Attachment:    Type:   Image     Comment:   External Document

## 2010-12-04 NOTE — Letter (Signed)
Summary: [facility]  [facility]   Imported By: Lanelle Bal 10/28/2010 08:38:19  _____________________________________________________________________  External Attachment:    Type:   Image     Comment:   External Document

## 2010-12-04 NOTE — Letter (Signed)
Summary: CMN for Wheelchair Parts/[facility]  CMN for Wheelchair Parts/[facility]   Imported By: Lanelle Bal 11/11/2010 13:02:16  _____________________________________________________________________  External Attachment:    Type:   Image     Comment:   External Document

## 2010-12-04 NOTE — Assessment & Plan Note (Signed)
Summary: surgery clearance//KP   Vital Signs:  Patient profile:   25 year old male Weight:      115 pounds Temp:     98.1 degrees F oral Pulse rate:   80 / minute Pulse rhythm:   regular BP sitting:   116 / 76  (left arm) Cuff size:   regular  Vitals Entered By: Almeta Monas CMA Duncan Dull) (November 11, 2010 1:37 PM) CC: surgery clearance, Pre-op Evaluation   History of Present Illness:  Pre-op Evaluation      I was asked to see this delightful patient today for Pre-op Evaluation.  Pt is here with mom for preop clearance for fusion C6-C2.  Pt with hx CP.  The patient denies respiratory symptoms, GI bleeding, chest pain, edema, PND, heavy ETOH use, and smoking.  Patient has no history of acute or recent MI, unstable or severe angina, decompensated CHF, high grade AV block, symptomatic ventricular arrhythmia, and severe valvular disease.  Patient has no history of mild angina(m), previous MI(m), compensated CHF(m), diabetes(m), renal insufficiency(m), advanced age(l), abnormal ECG(l), rhythm other than sinus(l), low functional capacity(l), stroke history(l), and uncontrolled HTN(l).  There is no history of antiplatelet agents, chronic steroids, warfarin, diabetes meds, antianginal meds, bleeding disorder, and FH anesthesia reaction.    Pt had aspiration  pneumonia after last surgery.  Pt will have swallow study after surgery---no solid food until its done.   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) 5)  Cerebral Palsy   (ICD-343.9) 6)  Feeding Problem  (ICD-783.3) 7)  Anxiety  (ICD-300.00) 8)  Depression  (ICD-311) 9)  Hyperthyroidism  (ICD-242.90) 10)  Esophagitis, Hx of  (ICD-V12.79) 11)  Gerd  (ICD-530.81) 12)  Aspiration Pneumonia  (ICD-507.0) 13)  Preventive Health Care  (ICD-V70.0) 14)  Abdominal Pain, Right Lower Quadrant  (ICD-789.03) 15)  Edema, Ankles  (ICD-782.3) 16)  Other Acne  (ICD-706.1) 17)  Palpitations  (ICD-785.1) 18)  Generalized Anxiety Disorder  (ICD-300.02) 19)  Cough  (ICD-786.2) 20)  Hip Pain, Bilateral  (ICD-719.45) 21)  Nevi, Multiple  (ICD-216.9) 22)  Abdominal Pain Other Specified Site  (ICD-789.09) 23)  Dysuria  (ICD-788.1) 24)  Preoperative Examination  (ICD-V72.84) 25)  Unspecified Pre-operative Examination  (ICD-V72.84) 26)  Family History Breast Cancer 1st Degree Relative <50  (ICD-V16.3) 27)  Family History of Asthma  (ICD-V17.5) 28)  Dysphagia Unspecified  (ICD-787.20) 29)  Incontinence of Feces  (ICD-787.6) 30)  Unspecified Urinary Incontinence  (ICD-788.30) 31)  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 Take 2 Capsule in The Am and 4 Capsules Qpm Via G Tube 2)  Paxil 20 Mg Tabs (Paroxetine Hcl) .Marland Kitchen.. 1 By Mouth Qd 3)  Zyprexa 5 Mg Tabs (Olanzapine) .Marland Kitchen.. 1 By Mouth At Bedtime 4)  Alprazolam 0.5 Mg  Tbdp (Alprazolam) .... Take One-Half To One Tablet As Needed For Agitation/anxiety. 5)  Carafate 1 Gm/37ml  Susp (Sucralfate) .... Use 2 Tsps Through Tube When Tube Is Bleeding. 6)  Power Economist .... Dispense One [employer] manager. 7)  Dynavox .Marland Kitchen.. 343.2 8)  A Pair of Deep Lateral Supports .... As Directed Dx Cerebral Palsy 9)  Valium 5 Mg Tabs (Diazepam) .Marland Kitchen.. 1 By Mouth Two Times A Day As  Needed 10)  Doxycycline Monohydrate 100 Mg Tabs (Doxycycline Monohydrate) .Marland Kitchen.. 1 By Mouth Once Daily 11)  Retin-A 0.1 % Crea (Tretinoin) .... Apply At Bedtime 12)  Albuterol Sulfate (2.5 Mg/83ml) 0.083% Nebu (Albuterol Sulfate) .Marland Kitchen.. 1 Neb Every 6 Hours As Needed For Chest Congestion 13)  Oxycodone Hcl 5 Mg Tabs (Oxycodone Hcl) .Marland Kitchen.. 1-2 Tablets Every 4-6 Hours 14)  Nexium 40 Mg Pack (Esomeprazole Magnesium) .... Take 1 Pack Via Peg Once Daily...pharmacy-Please D/c Prescription For Zegerid...too Expensive 15)  Carafate 1 Gm/52ml Susp (Sucralfate) .... Take 10 Cc By Mouth Two Times A Day  Current Medications (verified): 1)  Depakote Sprinkles 125 Mg Cpsp (Divalproex Sodium) .... Take 2 Capsule in The Am and 4 Capsules Qpm Via G Tube 2)  Paxil 20 Mg Tabs (Paroxetine Hcl) .Marland Kitchen.. 1 By Mouth Qd 3)  Zyprexa 5 Mg Tabs (Olanzapine) .Marland Kitchen.. 1 By Mouth At Bedtime 4)  Alprazolam 0.5 Mg  Tbdp (Alprazolam) .... Take One-Half To One Tablet As Needed For Agitation/anxiety. 5)  Power Economist .... Dispense One Heritage manager. 6)  Dynavox .Marland Kitchen.. 343.2 7)  A Pair of Deep Lateral Supports .... As Directed Dx Cerebral Palsy 8)  Valium 5 Mg Tabs (Diazepam) .Marland Kitchen.. 1 By Mouth Two Times A Day As Needed 9)  Doxycycline Monohydrate 100 Mg Tabs (Doxycycline Monohydrate) .Marland Kitchen.. 1 By Mouth Once Daily 10)  Retin-A 0.1 % Crea (Tretinoin) .... Apply At Bedtime 11)  Albuterol Sulfate (2.5 Mg/23ml) 0.083% Nebu (Albuterol Sulfate) .Marland Kitchen.. 1 Neb Every 6 Hours As Needed For Chest Congestion 12)  Oxycodone Hcl 5 Mg Tabs (Oxycodone Hcl) .Marland Kitchen.. 1-2 Tablets Every 4-6 Hours 13)  Coats Aloe Vera  Liqd (Aloe) .... By Mouth As Directed 14)  Carafate 1 Gm/74ml Susp (Sucralfate) .... Take 10 Cc By Mouth Two Times A Day  Allergies (verified): 1)  ! Sulfa 2)  ! Bactrim  Past History:  Past Medical History: Last updated: 08/04/2010 CP grade 3 kidney reflux Neurogenic bladder/ Trabeculated bladder G tube Bipolar disorder Neuromuscular  scoliosis  ANXIETY (ICD-300.00) DEPRESSION (ICD-311) HYPERTHYROIDISM (ICD-242.90) ESOPHAGITIS, HX OF (ICD-V12.79) GERD (ICD-530.81) ASPIRATION PNEUMONIA (ICD-507.0) PREVENTIVE HEALTH CARE (ICD-V70.0) ABDOMINAL PAIN, RIGHT LOWER QUADRANT (ICD-789.03) EDEMA, ANKLES (ICD-782.3) OTHER ACNE (ICD-706.1) PALPITATIONS (ICD-785.1) GENERALIZED ANXIETY DISORDER (ICD-300.02) COUGH (ICD-786.2) HIP PAIN, BILATERAL (ICD-719.45) NEVI, MULTIPLE (ICD-216.9) ABDOMINAL PAIN OTHER SPECIFIED SITE (ICD-789.09) DYSURIA (ICD-788.1) PREOPERATIVE EXAMINATION (ICD-V72.84) UNSPECIFIED PRE-OPERATIVE EXAMINATION (ICD-V72.84) FAMILY HISTORY BREAST CANCER 1ST DEGREE RELATIVE <50 (ICD-V16.3) FAMILY HISTORY OF ASTHMA (ICD-V17.5) DYSPHAGIA UNSPECIFIED (ICD-787.20) INCONTINENCE OF FECES (ICD-787.6) UNSPECIFIED URINARY INCONTINENCE (ICD-788.30) OTHER KYPHOSIS (ICD-737.19) FACIAL RASH (ICD-782.1) AFTERCARE, LONG-TERM USE, MEDICATIONS NEC (ICD-V58.69) PALSY, INFANTILE CEREBRAL, 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 Sharon Seller Hey-07/2010  Family History: Last updated: 08/01/2010 Family History of Asthma Family History Breast cancer : Maternal Grandmother Family History  of Prostate CA: Maternal Grandfather Family History of Colon Cancer: Great Grandmother Family History of Colon Polyps: Father Family History of Heart Disease: Paternal Grandfather  Social History: Last updated: 05/21/2008 Single Never Smoked Alcohol use-no Drug use-no Lives with parents wheelchair bound  Risk Factors: Alcohol Use: 0 (11/11/2010) Diet: loves sweets (11/11/2010)  Risk Factors: Smoking Status: never (11/11/2010)  Family History: Reviewed  history from 08/01/2010 and no changes required. Family History of Asthma Family History Breast cancer : Maternal Grandmother Family History of Prostate CA: Maternal Grandfather Family History of Colon Cancer: Great Grandmother Family History of Colon Polyps: Father Family History of Heart Disease: Paternal Grandfather  Social History: Reviewed history from 05/21/2008 and no changes required. Single Never Smoked Alcohol use-no Drug use-no Lives with parents wheelchair bound  Review of Systems      See HPI General:  Denies chills, fatigue, fever, loss of appetite, malaise, sleep disorder, sweats, weakness, and weight loss. Eyes:  Denies blurring, discharge, double vision, eye irritation, eye pain, halos, itching, light sensitivity, red eye, vision loss-1 eye, and vision loss-both eyes. ENT:  Denies decreased hearing, difficulty swallowing, ear discharge, earache, hoarseness, nasal congestion, nosebleeds, postnasal drainage, ringing in ears, sinus pressure, and sore throat. CV:  Denies bluish discoloration of lips or nails, chest pain or discomfort, difficulty breathing at night, difficulty breathing while lying down, fainting, fatigue, leg cramps with exertion, lightheadness, near fainting, palpitations, shortness of breath with exertion, swelling of feet, swelling of hands, and weight gain. Resp:  Denies chest discomfort, chest pain with inspiration, cough, coughing up blood, excessive snoring, hypersomnolence, morning headaches, pleuritic, shortness of breath, sputum productive, and wheezing. GI:  Denies abdominal pain, bloody stools, change in bowel habits, constipation, dark tarry stools, diarrhea, excessive appetite, gas, hemorrhoids, indigestion, loss of appetite, nausea, vomiting, vomiting blood, and yellowish skin color. GU:  Denies decreased libido, discharge, dysuria, erectile dysfunction, genital sores, hematuria, incontinence, nocturia, urinary frequency, and urinary  hesitancy. MS:  Denies joint pain, joint redness, joint swelling, loss of strength, low back pain, mid back pain, muscle aches, muscle , cramps, muscle weakness, stiffness, and thoracic pain. Neuro:  Complains of poor balance and weakness; denies brief paralysis, difficulty with concentration, disturbances in coordination, headaches, memory loss, numbness, seizures, sensation of room spinning, tingling, tremors, and visual disturbances; pt with hx CP--is in wheelchair and speaks with computer. Psych:  Denies alternate hallucination ( auditory/visual), anxiety, depression, easily angered, easily tearful, irritability, mental problems, panic attacks, sense of great danger, suicidal thoughts/plans, thoughts of violence, unusual visions or sounds, and thoughts /plans of harming others. Endo:  Denies cold intolerance, excessive hunger, excessive thirst, excessive urination, heat intolerance, polyuria, and weight change. Heme:  Denies abnormal bruising, bleeding, enlarge lymph nodes, fevers, pallor, and skin discoloration. Allergy:  Denies hives or rash, itching eyes, persistent infections, seasonal allergies, and sneezing.  Physical Exam  General:  Well-developed,well-nourished,in no acute distress; alert,appropriate and cooperative throughout examination, in wheelchair Head:  Normocephalic and atraumatic without obvious abnormalities. No apparent alopecia or balding. Eyes:  pupils equal, pupils round, pupils reactive to light, and no injection.   Ears:  External ear exam shows no significant lesions or deformities.  Otoscopic examination reveals clear canals, tympanic membranes are intact bilaterally without bulging, retraction, inflammation or discharge. Hearing is grossly normal bilaterally. Nose:  External nasal examination shows no deformity or inflammation. Nasal mucosa are pink and moist without lesions or exudates. Mouth:  Oral mucosa and oropharynx without lesions or exudates.  Teeth in good  repair.  Neck:  No deformities, masses, or tenderness noted. Lungs:  Normal respiratory effort, chest expands symmetrically. Lungs are clear to auscultation, no crackles or wheezes. Heart:  normal rate and no murmur.   Abdomen:  + g tube---some errythema around tube,  no drainage Msk:  spastic movements--baseline for pt Extremities:  No clubbing, cyanosis, edema Neurologic:  alert & oriented X3.   Psych:  Oriented X3 and normally interactive.     Impression & Recommendations:  Problem # 1:  KYPHOSIS (ICD-737.10) Pt cleared for spinal fusion aspiration precautions to be taken  Problem # 2:  ASPIRATION PNEUMONIA (ICD-507.0) Assessment: Improved  Problem # 3:  CEREBRAL PALSY (ICD-343.9)  Problem # 4:  GENERALIZED ANXIETY DISORDER (ICD-300.02)  His updated medication list for this problem includes:    Paxil 20 Mg Tabs (Paroxetine hcl) .Marland Kitchen... 1 by mouth qd    Alprazolam 0.5 Mg Tbdp (Alprazolam) .Marland Kitchen... Take one-half to one tablet as needed for agitation/anxiety.    Valium 5 Mg Tabs (Diazepam) .Marland Kitchen... 1 by mouth two times a day as needed  Complete Medication List: 1)  Depakote Sprinkles 125 Mg Cpsp (Divalproex sodium) .... Take 2 capsule in the am and 4 capsules qpm via g tube 2)  Paxil 20 Mg Tabs (Paroxetine hcl) .Marland Kitchen.. 1 by mouth qd 3)  Zyprexa 5 Mg Tabs (Olanzapine) .Marland Kitchen.. 1 by mouth at bedtime 4)  Alprazolam 0.5 Mg Tbdp (Alprazolam) .... Take one-half to one tablet as needed for agitation/anxiety. 5)  Power Economist  .... Dispense one power wheel chair. 6)  Dynavox  .Marland Kitchen.. 343.2 7)  A Pair of Deep Lateral Supports  .... As directed dx cerebral palsy 8)  Valium 5 Mg Tabs (Diazepam) .Marland Kitchen.. 1 by mouth two times a day as needed 9)  Doxycycline Monohydrate 100 Mg Tabs (Doxycycline monohydrate) .Marland Kitchen.. 1 by mouth once daily 10)  Retin-a 0.1 % Crea (Tretinoin) .... Apply at bedtime 11)  Albuterol Sulfate (2.5 Mg/59ml) 0.083% Nebu (Albuterol sulfate) .Marland Kitchen.. 1 neb every 6 hours as needed for chest  congestion 12)  Oxycodone Hcl 5 Mg Tabs (Oxycodone hcl) .Marland Kitchen.. 1-2 tablets every 4-6 hours 13)  Coats Aloe Vera Liqd (Aloe) .... By mouth as directed 14)  Carafate 1 Gm/55ml Susp (Sucralfate) .... Take 10 cc by mouth two times a day   Orders Added: 1)  Est. Patient Level IV [14782]  Appended Document: surgery clearance//KP D/W Dr Harolyn Rutherford------ Pt is cleared for spinal fusion as long as aspiration precautions are taken.  Please call with any further questions.

## 2010-12-04 NOTE — Progress Notes (Signed)
Summary:  Prior Auth MED CHANGED TO GENERIC  [employer] DEPAKOTE   Phone Note Refill Request   Refills Requested: Medication #1:  DEPAKOTE SPRINKLES 125 MG CPSP Take 2 capsule in the am and 4 capsules qpm via g tube [BMN] Prior Auth [id number].........Marland KitchenFelecia Deloach CMA  November 19, 2010 10:50 AM    Follow-up for Phone Call        generic preferred med is: divalproex sodium  awaiting fax case #[id number]..........Marland KitchenFelecia Deloach CMA  November 19, 2010 11:10 AM   Per dr Laury Axon ok to change to generic but check with pt mom to make sure it is ok to substitute a generic. Tried to call no answer phone just rang will try again later.........Marland KitchenFelecia Deloach CMA  November 20, 2010 8:54 AM   Tried to contact Pt still no answer........Marland KitchenFelecia Deloach CMA  November 20, 2010 10:55 AM  Tried to call mom again still no answer or machine...Marland KitchenMarland KitchenFelecia Deloach CMA  November 20, 2010 3:04 PM   Additional Follow-up for Phone Call Additional follow up Details #1::        Left message for pts mom to call back. Army Fossa CMA  November 21, 2010 1:29 PM  I spoke w/ pts aunt who is watching the house. she states that the pt is having a procedure done and not sure when they will be back. She will have them give [place] a call. Army Fossa CMA  November 21, 2010 3:19 PM   Discuss with patient mom, ok with trying generic and will call with any issue regarding med change...Marland KitchenMarland KitchenFelecia Deloach CMA  November 25, 2010 12:39 PM     New/Updated Medications: DEPAKOTE SPRINKLES 125 MG CPSP (DIVALPROEX SODIUM) Take 2 capsule in the am and 4 capsules qpm via g tube**Generic ok**  Prescriptions: DEPAKOTE SPRINKLES 125 MG CPSP (DIVALPROEX SODIUM) Take 2 capsule in the am and 4 capsules qpm via g tube**Generic ok**  #180 x 3   Entered by:   Jeremy Johann CMA   Authorized by:   Loreen Freud DO   Signed by:   Jeremy Johann CMA on 11/25/2010   Method used:   Faxed to ...       [facility]  [address] [id number] (retail)  [address]       [place], [place]  [address]       Ph: [phone] or [phone]       Fax: [phone]   RxID:   [id number]

## 2010-12-05 ENCOUNTER — Encounter: Payer: Self-pay | Admitting: Nurse Practitioner

## 2010-12-05 ENCOUNTER — Ambulatory Visit (INDEPENDENT_AMBULATORY_CARE_PROVIDER_SITE_OTHER): Payer: Medicare Other | Admitting: Nurse Practitioner

## 2010-12-05 DIAGNOSIS — K5900 Constipation, unspecified: Secondary | ICD-10-CM | POA: Insufficient documentation

## 2010-12-05 DIAGNOSIS — K5909 Other constipation: Secondary | ICD-10-CM | POA: Insufficient documentation

## 2010-12-05 DIAGNOSIS — R635 Abnormal weight gain: Secondary | ICD-10-CM

## 2010-12-05 DIAGNOSIS — D649 Anemia, unspecified: Secondary | ICD-10-CM | POA: Insufficient documentation

## 2010-12-08 ENCOUNTER — Telehealth: Payer: Self-pay | Admitting: Nurse Practitioner

## 2010-12-10 ENCOUNTER — Encounter: Payer: Self-pay | Admitting: Family Medicine

## 2010-12-10 ENCOUNTER — Telehealth (INDEPENDENT_AMBULATORY_CARE_PROVIDER_SITE_OTHER): Payer: Self-pay | Admitting: *Deleted

## 2010-12-10 NOTE — Progress Notes (Signed)
Summary: Stomach is not emptying   Phone Note Call from Patient Call back at Home Phone [phone]   Caller: Kayren Eaves [phone]  Call For: DR Juanda Chance Reason for Call: Talk to Nurse Summary of Call: His stomach is not emptying. Has not had a bm since sunday or urinitating. Feels that he needs to be seen asap. Went to med center and they tell her he is fine but he is not fine. Initial call taken by: Leanor Kail [facility],  December 04, 2010 12:57 PM  Follow-up for Phone Call        Spoke with patients mother Bonita Quin. She states the patient had spinal fusion on 11/20/10 and was hospitalized until 11/25/10 (in [place]). He was d/c'ed and ended back in the hospital on 11/25/10-12/01/10 with an ileus and aspiration pneumonia([facility]). She states the home health nurse has been by and is stating his stomach is not emptying and he has weak bowel sounds. She states she took him last night to [facility] and they told her he was okay but she does not think he is. Spoke with Willette Cluster, RNP. Patient can come on and be seen. If distended, get KUB. Follow-up by: Jesse Fall RN,  December 04, 2010 2:39 PM  Additional Follow-up for Phone Call Additional follow up Details #1::        Spoke with patient's mother. She states she has now spoken with Dr. Laury Axon and she recommends they call the neurosurgeon and go back to [facility] where patient had the surgery. Mother states the patient is not having bowel movements or voiding. She is calling patient's neurosurgeon for directions at this time. She will call us if she needs anything after talking with his surgeon. Additional Follow-up by: Jesse Fall RN,  December 04, 2010 2:42 PM     Appended Document: Stomach is not emptying It does not sound like a neurosurgical problem but a GI problem. please offer KUB, add Reglan 5 mg per ? PEG three times a day and decrease tube feedings to facilitate gastric emptying. Dilute tube feedings 50% with water x 48 hrs. Also  OK to give him Dulcolox soppository as needed.

## 2010-12-10 NOTE — Progress Notes (Signed)
Summary: Pt bowel not emptying  Phone Note Call from Patient   Caller: Patient Call For: Loreen Freud DO Summary of Call: Call from [facility] and she stated that she does not think [name] is emptying his bowel. She stated the home health nurse does not think his stomach is not emptying as well because his bowel sounds were weak, he is vomiting, last weak had aspiration pneumonia due to the same issue, says he is miserable, I advised he will need to nbe seen by the ED ad she stated his X-ray was normal last night. Wants to know if she needs to come in or should she take him to [facility].  c/b # [id number] Initial call taken by: Almeta Monas CMA Duncan Dull),  December 04, 2010 2:06 PM  Follow-up for Phone Call        We can fax ER visit to [facility]--- she should call Dr Elvina Sidle since he did the surgery----call us back if she has any trouble Follow-up by: Loreen Freud DO,  December 04, 2010 2:17 PM  Additional Follow-up for Phone Call Additional follow up Details #1::        spoke with Nurse at Dr.Hey's office. THey spoke with the patient earlier today, I gave them details of the ED report and the above phone call  and she stated she would contact [place]. Report faxed to [phone] Additional Follow-up by: Almeta Monas CMA Duncan Dull),  December 04, 2010 2:33 PM

## 2010-12-10 NOTE — Progress Notes (Signed)
        Additional Follow-up for Phone Call Additional follow up Details #2::    Received a call from [facility] with [facility] asking if Willette Cluster, RNP would see patient today since they have no beds in their hospital at this time. Willette Cluster, RNP reviewed chart, xray and ER report from last PM. She states she can see patient in AM and have tube feeding held for now. She is concerned about patient not voiding?dehyrdation or? neuro problem. Spoke with Verlon Au at [facility] with Willette Cluster, RNP suggestions and concerns. Dr. Anda Latina office states they were unaware of patient not voiding and they will call patient's mother to discuss this and call us back. Jesse Fall RN  December 04, 2010 3:57 PM Verlon Au called back to report patient is voiding and would like for [place] to see patient in AM.  Per Linda(mother) his catheter had come off and his depend was wet.Called and spoke with Linda(mother) to schedule patient at 10:00 AM on 12/05/10 with Willette Cluster, RNP. Bonita Quin understands to hold tube feedings tonight. Follow-up by: Jesse Fall RN,  December 04, 2010 4:51 PM

## 2010-12-10 NOTE — Progress Notes (Signed)
Summary: order for  Phone Note From Other Clinic Call back at [phone]   Caller: [name] Summary of Call: Therapist F/U with PT after spinal fusion. Orders needed for home health occupational therapy. Verbal order ok. Pls advise..........Marland KitchenFelecia Deloach CMA  December 03, 2010 3:50 PM   Follow-up for Phone Call        shouldn't the surgeon who did surgery be doing this? Pt went to ER today---he may be readmitted.   Follow-up by: Loreen Freud DO,  December 03, 2010 8:15 PM  Additional Follow-up for Phone Call Additional follow up Details #1::        called and left a mssg on vm advising to contact the surgeon... Additional Follow-up by: Almeta Monas CMA Duncan Dull),  December 04, 2010 2:31 PM

## 2010-12-10 NOTE — Progress Notes (Signed)
Summary: FYI-abdominal tenderness going to er  Phone Note Call from Patient   Caller: Mom Summary of Call: Spoke w/ patient mom was just release from hospital for ileus on 12/01/10 and would like to know what she should do b/c patient is having abdominal tenderness and hasn't had BM in 3 days.  Wanted to know if she should just give patient an enema informed mom that w/ abdominal tenderness patient she be taken to emergency room to redo xray since hospital noted that ileus had resolved and to be sure patient doesn't have obstruction.  Wanted to get [facility] but informed that Dr. Laury Axon out of office but will go to [facility] for evaluation ..........Marland KitchenDoristine Devoid CMA  December 03, 2010 4:46 PM

## 2010-12-11 ENCOUNTER — Encounter: Payer: Self-pay | Admitting: Family Medicine

## 2010-12-11 ENCOUNTER — Other Ambulatory Visit: Payer: Self-pay | Admitting: Internal Medicine

## 2010-12-11 ENCOUNTER — Telehealth: Payer: Self-pay | Admitting: Internal Medicine

## 2010-12-11 ENCOUNTER — Telehealth (INDEPENDENT_AMBULATORY_CARE_PROVIDER_SITE_OTHER): Payer: Self-pay | Admitting: *Deleted

## 2010-12-11 ENCOUNTER — Ambulatory Visit (INDEPENDENT_AMBULATORY_CARE_PROVIDER_SITE_OTHER): Payer: Medicare Other | Admitting: Family Medicine

## 2010-12-11 ENCOUNTER — Other Ambulatory Visit: Payer: Self-pay | Admitting: Family Medicine

## 2010-12-11 DIAGNOSIS — R109 Unspecified abdominal pain: Secondary | ICD-10-CM

## 2010-12-11 DIAGNOSIS — K5900 Constipation, unspecified: Secondary | ICD-10-CM

## 2010-12-12 ENCOUNTER — Ambulatory Visit (INDEPENDENT_AMBULATORY_CARE_PROVIDER_SITE_OTHER)
Admission: RE | Admit: 2010-12-12 | Discharge: 2010-12-12 | Disposition: A | Discharge status: Home / Self Care | Payer: Managed Care, Other (non HMO) | Source: Ambulatory Visit | Attending: Family Medicine | Admitting: Family Medicine

## 2010-12-12 ENCOUNTER — Other Ambulatory Visit: Payer: Self-pay | Admitting: Internal Medicine

## 2010-12-12 ENCOUNTER — Telehealth: Payer: Self-pay | Admitting: Internal Medicine

## 2010-12-12 DIAGNOSIS — I81 Portal vein thrombosis: Secondary | ICD-10-CM | POA: Insufficient documentation

## 2010-12-12 DIAGNOSIS — R109 Unspecified abdominal pain: Secondary | ICD-10-CM

## 2010-12-12 LAB — TSH: TSH: 1.05 u[IU]/mL (ref 0.35–5.50)

## 2010-12-12 LAB — BASIC METABOLIC PANEL
BUN: 11 mg/dL (ref 6–23)
CO2: 28 mEq/L (ref 19–32)
Calcium: 9 mg/dL (ref 8.4–10.5)
Chloride: 100 mEq/L (ref 96–112)
Creatinine, Ser: 0.5 mg/dL (ref 0.4–1.5)
GFR: 239.87 mL/min (ref >60.00)
Glucose, Bld: 96 mg/dL (ref 70–99)
Potassium: 4.3 mEq/L (ref 3.5–5.1)
Sodium: 137 mEq/L (ref 135–145)

## 2010-12-12 LAB — CBC WITH DIFFERENTIAL/PLATELET
Basophils Absolute: 0 10*3/uL (ref 0.0–0.1)
Basophils Relative: 0.2 % (ref 0.0–3.0)
Eosinophils Absolute: 0.1 10*3/uL (ref 0.0–0.7)
Eosinophils Relative: 2 % (ref 0.0–5.0)
HCT: 34 % — ABNORMAL LOW (ref 39.0–52.0)
Hemoglobin: 11.4 g/dL — ABNORMAL LOW (ref 13.0–17.0)
Lymphocytes Relative: 54.5 % — ABNORMAL HIGH (ref 12.0–46.0)
Lymphs Abs: 2.6 10*3/uL (ref 0.7–4.0)
MCHC: 33.5 g/dL (ref 30.0–36.0)
MCV: 84.6 fl (ref 78.0–100.0)
Monocytes Absolute: 0.3 10*3/uL (ref 0.1–1.0)
Monocytes Relative: 6.5 % (ref 3.0–12.0)
Neutro Abs: 1.8 10*3/uL (ref 1.4–7.7)
Neutrophils Relative %: 36.8 % — ABNORMAL LOW (ref 43.0–77.0)
Platelets: 541 10*3/uL — ABNORMAL HIGH (ref 150.0–400.0)
RBC: 4.02 Mil/uL — ABNORMAL LOW (ref 4.22–5.81)
RDW: 15.1 % — ABNORMAL HIGH (ref 11.5–14.6)
WBC: 4.8 10*3/uL (ref 4.5–10.5)

## 2010-12-12 LAB — HEPATIC FUNCTION PANEL
ALT: 12 U/L (ref 0–53)
AST: 12 U/L (ref 0–37)
Albumin: 3.5 g/dL (ref 3.5–5.2)
Alkaline Phosphatase: 73 U/L (ref 39–117)
Bilirubin, Direct: 0.1 mg/dL (ref 0.0–0.3)
Total Bilirubin: 0.5 mg/dL (ref 0.3–1.2)
Total Protein: 6.6 g/dL (ref 6.0–8.3)

## 2010-12-12 MED ORDER — IOHEXOL 300 MG/ML  SOLN: 100.0000 mL | Freq: Once | INTRAMUSCULAR | Status: AC | PRN

## 2010-12-15 ENCOUNTER — Other Ambulatory Visit (HOSPITAL_COMMUNITY): Payer: Managed Care, Other (non HMO)

## 2010-12-15 ENCOUNTER — Encounter: Payer: Self-pay | Admitting: Internal Medicine

## 2010-12-15 ENCOUNTER — Ambulatory Visit (INDEPENDENT_AMBULATORY_CARE_PROVIDER_SITE_OTHER): Payer: Managed Care, Other (non HMO) | Admitting: Internal Medicine

## 2010-12-15 DIAGNOSIS — K3184 Gastroparesis: Secondary | ICD-10-CM

## 2010-12-15 DIAGNOSIS — K560 Paralytic ileus: Secondary | ICD-10-CM

## 2010-12-15 DIAGNOSIS — R933 Abnormal findings on diagnostic imaging of other parts of digestive tract: Secondary | ICD-10-CM

## 2010-12-17 ENCOUNTER — Ambulatory Visit
Admission: RE | Admit: 2010-12-17 | Discharge: 2010-12-17 | Disposition: A | Discharge status: Home / Self Care | Payer: Managed Care, Other (non HMO) | Source: Ambulatory Visit | Attending: Internal Medicine | Admitting: Internal Medicine

## 2010-12-17 ENCOUNTER — Other Ambulatory Visit: Payer: Self-pay | Admitting: Internal Medicine

## 2010-12-17 ENCOUNTER — Encounter: Payer: Self-pay | Admitting: Family Medicine

## 2010-12-17 DIAGNOSIS — M4000 Postural kyphosis, site unspecified: Secondary | ICD-10-CM

## 2010-12-17 DIAGNOSIS — G808 Other cerebral palsy: Secondary | ICD-10-CM

## 2010-12-17 DIAGNOSIS — I81 Portal vein thrombosis: Secondary | ICD-10-CM

## 2010-12-17 DIAGNOSIS — J690 Pneumonitis due to inhalation of food and vomit: Secondary | ICD-10-CM

## 2010-12-17 DIAGNOSIS — R1312 Dysphagia, oropharyngeal phase: Secondary | ICD-10-CM

## 2010-12-18 NOTE — Assessment & Plan Note (Signed)
Summary: Ileus/ok to add per kim/kn   Vital Signs:  Patient profile:   25 year old male O2 Sat:      97 % on Room air Temp:     98.1 degrees F oral Pulse rate:   82 / minute Pulse rhythm:   regular BP sitting:   116 / 72  (left arm) Cuff size:   regular  Vitals Entered By: Almeta Monas CMA Duncan Dull) (December 11, 2010 3:39 PM)  O2 Flow:  Room air CC: Issues with Bowel movements   History of Present Illness: Pt here with mom f/u ER and hospital for ileus.   Pt has been constipated and has been c/o abd pain.  mom is giving him fiber and miralax.  They are also using enemas every few days.   GI has scheduled UGI next week but mom is frustrated becase she feels he is not getting the nutrition he needs and is not having normal BMs/  Current Medications (verified): 1)  Depakote Sprinkles 125 Mg Cpsp (Divalproex Sodium) .... Take 2 Capsule in The Am and 4 Capsules Qpm Via G Tube**generic Ok** 2)  Paxil 20 Mg Tabs (Paroxetine Hcl) .Marland Kitchen.. 1 By Mouth Qd 3)  Zyprexa 5 Mg Tabs (Olanzapine) .Marland Kitchen.. 1 By Mouth At Bedtime 4)  Alprazolam 0.5 Mg  Tbdp (Alprazolam) .... Take One-Half To One Tablet As Needed For Agitation/anxiety. 5)  Power Economist .... Dispense One Heritage manager. 6)  Dynavox .Marland Kitchen.. 343.2 7)  A Pair of Deep Lateral Supports .... As Directed Dx Cerebral Palsy 8)  Valium 5 Mg Tabs (Diazepam) .Marland Kitchen.. 1 By Mouth Two Times A Day As Needed 9)  Retin-A 0.1 % Crea (Tretinoin) .... Apply At Bedtime 10)  Albuterol Sulfate (2.5 Mg/63ml) 0.083% Nebu (Albuterol Sulfate) .Marland Kitchen.. 1 Neb Every 6 Hours As Needed For Chest Congestion 11)  Oxycodone Hcl 5 Mg Tabs (Oxycodone Hcl) .Marland Kitchen.. 1-2 Tablets Every 4-6 Hours 12)  Coats Aloe Vera  Liqd (Aloe) .... By Mouth As Directed 13)  Scandishake (Lactose Free)  Pack (Nutritional Supplements) .Marland Kitchen.. 40 Ml/hr in Tube Every 24 Hours 14)  Reglan 5 Mg Tabs (Metoclopramide Hcl) .Marland Kitchen.. 1 Tablet in Tube Every 8 Hours As Needed 15)  Acetaminophen 500 Mg Tabs  (Acetaminophen) .Marland Kitchen.. 1 or 2 Tablets in Tube Every6 Hours As Needed 16)  Atenolol 50 Mg Tabs (Atenolol) .... 1/2 Tablet in Tube Two Times A Day 17)  Nexium 40 Mg Pack (Esomeprazole Magnesium) .... As Directed 18)  Miralax  Pack (Polyethylene Glycol 3350) .... As Directed 19)  Coats Aloe Vera  Liqd (Aloe Vera) .... As Directed  Allergies (verified): 1)  ! Sulfa 2)  ! Bactrim  Past History:  Past medical, surgical, family and social histories (including risk factors) reviewed for relevance to current acute and chronic problems.  Past Medical History: Reviewed history from 12/05/2010 and no changes required. 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) COUGH (ICD-786.2) HIP PAIN, BILATERAL (ICD-719.45) NEVI, MULTIPLE (ICD-216.9) DYSURIA (ICD-788.1) PREOPERATIVE EXAMINATION (ICD-V72.84) UNSPECIFIED PRE-OPERATIVE EXAMINATION (ICD-V72.84) FAMILY HISTORY BREAST CANCER 1ST DEGREE RELATIVE <50 (ICD-V16.3) FAMILY HISTORY OF ASTHMA (ICD-V17.5) INCONTINENCE OF FECES (ICD-787.6) UNSPECIFIED URINARY INCONTINENCE (ICD-788.30) 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 g tube s/p Baclofen pump with revision x 1 s/p PSF T4-L4 03/1999 Hamstring release x 2 Spinal Fusion Toe Surgery x 2 Eye Surgery x 2 Tonsillectomy  Spinal Fusion from T-9 to C6 at [facility], Dr. Sharon Seller  Hey-07/2010  Family History: Reviewed history from 08/01/2010 and no changes required. Family History of Asthma Family History Breast cancer : Maternal Grandmother Family History of Prostate CA: Maternal Grandfather Family History of Colon Cancer: Great Grandmother Family History of Colon Polyps: Father Family History of Heart Disease: Paternal Grandfather  Social History: Reviewed history from 05/21/2008 and no changes required. Single Never Smoked Alcohol use-no Drug use-no Lives with parents wheelchair bound  Review of Systems      See HPI  Physical Exam  General:  Well-developed,well-nourished,in no acute distress; alert,appropriate and cooperative throughout examination Abdomen:  + soft + tenderness LLQ and MLQ pain Extremities:  No clubbing, cyanosis, edema, or deformity noted with normal full range of motion of all joints.   Psych:  Cognition and judgment appear intact. Alert and cooperative with normal attention span and concentration. No apparent delusions, illusions, hallucinations   Impression & Recommendations:  Problem # 1:  ABDOMINAL PAIN OTHER SPECIFIED SITE (ICD-789.09)  His updated medication list for this problem includes:    Oxycodone Hcl 5 Mg Tabs (Oxycodone hcl) .Marland Kitchen... 1-2 tablets every 4-6 hours    Acetaminophen 500 Mg Tabs (Acetaminophen) .Marland Kitchen... 1 or 2 tablets in tube every6 hours as needed  Orders: Venipuncture (16109) TLB-BMP (Basic Metabolic Panel-BMET) (80048-METABOL) TLB-CBC Platelet - w/Differential (85025-CBCD) TLB-Hepatic/Liver Function Pnl (80076-HEPATIC) TLB-TSH (Thyroid Stimulating Hormone) (84443-TSH) Specimen Handling (60454) Radiology Referral (Radiology)  Discussed use of medications, application of heat or cold, and exercises.   Problem # 2:  CONSTIPATION (ICD-564.00)  His updated medication list for this problem includes:    Coats Aloe Vera Liqd (Aloe) ..... By mouth as directed    Miralax Pack (Polyethylene glycol 3350)  .Marland Kitchen... As directed  Discussed dietary fiber measures and increased water intake.   Complete Medication List: 1)  Depakote Sprinkles 125 Mg Cpsp (Divalproex sodium) .... Take 2 capsule in the am and 4 capsules qpm via g tube**generic ok** 2)  Paxil 20 Mg Tabs (Paroxetine hcl) .Marland Kitchen.. 1 by mouth qd 3)  Zyprexa 5 Mg Tabs (Olanzapine) .Marland Kitchen.. 1 by mouth at bedtime 4)  Alprazolam 0.5 Mg Tbdp (Alprazolam) .... Take one-half to one tablet as needed for agitation/anxiety. 5)  Power Economist  .... Dispense one power wheel chair. 6)  Dynavox  .Marland Kitchen.. 343.2 7)  A Pair of Deep Lateral Supports  .... As directed dx cerebral palsy 8)  Valium 5 Mg Tabs (Diazepam) .Marland Kitchen.. 1 by mouth two times a day as needed 9)  Retin-a 0.1 % Crea (Tretinoin) .... Apply at bedtime 10)  Albuterol Sulfate (2.5 Mg/62ml) 0.083% Nebu (Albuterol sulfate) .Marland Kitchen.. 1 neb every 6 hours as needed for chest congestion 11)  Oxycodone Hcl 5 Mg Tabs (Oxycodone hcl) .Marland Kitchen.. 1-2 tablets every 4-6 hours 12)  Coats Aloe Vera Liqd (Aloe) .... By mouth as directed 13)  Scandishake (lactose Free) Pack (Nutritional supplements) .Marland Kitchen.. 40 ml/hr in tube every 24 hours 14)  Reglan 5 Mg Tabs (Metoclopramide hcl) .Marland Kitchen.. 1 tablet in tube every 8 hours as needed 15)  Acetaminophen 500 Mg Tabs (Acetaminophen) .Marland Kitchen.. 1 or 2 tablets in tube every6 hours as needed 16)  Atenolol 50 Mg Tabs (Atenolol) .Marland KitchenMarland KitchenMarland Kitchen  1/2 tablet in tube two times a day 17)  Nexium 40 Mg Pack (Esomeprazole magnesium) .... As directed 18)  Miralax Pack (Polyethylene glycol 3350) .... As directed 19)  Coats Aloe Vera Liqd (Aloe vera) .... As directed   Orders Added: 1)  Venipuncture [36415] 2)  TLB-BMP (Basic Metabolic Panel-BMET) [80048-METABOL] 3)  TLB-CBC Platelet - w/Differential [85025-CBCD] 4)  TLB-Hepatic/Liver Function Pnl [80076-HEPATIC] 5)  TLB-TSH (Thyroid Stimulating Hormone) [84443-TSH] 6)  Specimen Handling [99000] 7)  Radiology Referral [Radiology] 8)  Est. Patient Level III  [81191]

## 2010-12-18 NOTE — Progress Notes (Signed)
 ----   Converted from flag ---- ---- 12/10/2010 9:57 PM, Hart Carwin MD wrote: please start Miralax 17 gms via G-tube daily, also set up  UGI and  small bowl follow through vie G.tube. May use Dulcolox supp  once daily to help the constipation. After Barium x-ray, continue Miralax to  clear the barium. He had a normal CT scan 01/2010 for similar symptoms. ------------------------------  Phone Note Outgoing Call   Summary of Call: Scheduled UGI with small bowel follow through at [facility] on 12/15/10 at 10:30 AM with 10:15 AM arrival. NPO after midnight. Called and spoke with Bonita Quin patient's mother and gave her the appointment and Dr. Regino Schultze recommendations. She states she started the Miralax last night and gave him a dose this AM also. Initial call taken by: Jesse Fall RN,  December 11, 2010 9:18 AM

## 2010-12-18 NOTE — Progress Notes (Signed)
Summary: Triage   Phone Note Call from Patient Call back at Home Phone [phone]   Caller: Patient Call For: Gunnar Fusi Reason for Call: Talk to Nurse Summary of Call: Pts mother is calling back to follow up with Rene Kocher from friday Initial call taken by: [place] Johnson,  December 08, 2010 9:54 AM  Follow-up for Phone Call        Spoke with patient's mother. Patient had been doing better over the weekend until last night. He had his last dose of Reglan yesterday.  She states the formula did not agree with him so she had been giving him Boost, Ensure or Soy Protein Milkshake 4 oz and he did well with this. Last night at 7 PM, she gave him Boost and 8 PM water and pills crushed via tube. At 8:30 PM, he was in bed and she gave him water and Depakote. He started coughing and choking. Coughed for one hour. She feels sure he aspirated. Temp up to 101.9. She got him up and ready to go to ER then he stopped coughing. Put him back to bed and he slept all night. Did not go to ER. This AM, he is up in chair, she has given him his morning Depakote and Reglan again. He has not had a BM since the enema on Friday. She has a call in to Dr. Laury Axon office for a visit also. She remembers someone saying he might need a "dye study." Spoke with Willette Cluster, RNP. Need to make sure he is taking PPI, Continue Reglan, Only feed when up in chair, schedule f/u with Dr. Juanda Chance. Dye study is not needed because his tube is functioning. Spoke with mom and gave her recommendations as per  Willette Cluster, NP. Also, reminded mom that she can use a Dulcolax supp to help with BM's. I am working on adding patient to Dr. Regino Schultze schedule for next week and will call her with appointment when I get it in the computer system.  Follow-up by: Jesse Fall RN,  December 08, 2010 10:41 AM  Additional Follow-up for Phone Call Additional follow up Details #1::        Scheduled patient to see Dr. Juanda Chance on 12/15/10 at 8:15 AM. Message left for  patient's mother to call back. Additional Follow-up by: Jesse Fall RN,  December 08, 2010 1:51 PM    Additional Follow-up for Phone Call Additional follow up Details #2::    [name] given appointment date and time. Follow-up by: Jesse Fall RN,  December 08, 2010 3:22 PM  Additional Follow-up for Phone Call Additional follow up Details #3:: Details for Additional Follow-up Action Taken: agree Additional Follow-up by: Hart Carwin MD,  December 08, 2010 10:32 PM

## 2010-12-18 NOTE — Progress Notes (Signed)
Summary: FYI for Dr.Brodie  Phone Note Outgoing Call   Call placed by: Almeta Monas CMA Duncan Dull),  December 11, 2010 4:22 PM Call placed to: Specialist Summary of Call: Called to [facility] and left a message with the receptionist to advised that patient is being seen today by Dr.Lowne and she is going to order a CT of the abdomen due to the patient still having the discomfort. I also advise mom was pretty frustrated. She stated she would get the mssg to Dr. Juanda Chance Initial call taken by: Almeta Monas CMA Duncan Dull),  December 11, 2010 4:26 PM

## 2010-12-18 NOTE — Progress Notes (Signed)
        Additional Follow-up for Phone Call Additional follow up Details #2::    Patient's mom called to update patient's condition. She has been trying to "rest" his stomach by holding his nutrition. Yesterday, she fed him water with a little ginger tea and after , patient begged her to stop c/o LLQ pain. Today she tried to give him some calories- 125cal. in 40ml diluted with water over 2 hours and he c/o pain again and asked her to stop. Mom is convinced patient's stomach isn't emptying even though she's giving him Nexium and Reglan; he hasn't thrown up. Patient sees Dr Juanda Chance on 12/15/10, but mom is concerned he hasn't had any nutrition since his ileus. Speech has worked with him on his swallowing and reports the swallowing is good. Today, patient had a smear of a BM; she doesn't think he's passing gas because they don't smell anything or hear anything. Mom wonders if Miralax would help the patient or does he need a CT scan? I informed Mom I would let Dr Juanda Chance know.Graciella Freer RN  December 10, 2010 4:40 PM   Additional Follow-up for Phone Call Additional follow up Details #3:: Details for Additional Follow-up Action Taken: See phone note on 12/11/10 for Dr. Delia Chimes recommendations Additional Follow-up by: Jesse Fall RN,  December 11, 2010 9:22 AM

## 2010-12-18 NOTE — Letter (Signed)
Summary: Conservation officer, historic buildings Healthcare Approval   Imported By: Kassie Mends 12/11/2010 09:56:35  _____________________________________________________________________  External Attachment:    Type:   Image     Comment:   External Document

## 2010-12-18 NOTE — Progress Notes (Signed)
Summary: Update   Phone Note Call from Patient Call back at Home Phone [phone]   Caller: Doctor office Call For: Dr. Juanda Chance Reason for Call: Talk to Nurse Summary of Call: Doctor Hulan Saas office called to inform us that this patient was seen there today and they just wanted to give [place] a heads up that the patients mother is very upset.Marland Kitchenalso see note entered by Dr. Hulan Saas office about CT Initial call taken by: [place] Johnson,  December 11, 2010 4:23 PM  Follow-up for Phone Call        reviewed, pt with severe UGI reflux, ? gastroparesis ans now prolonged ileus. His TF's have been cut back but his mother concerned about the lack of nutrition. I understand the problem. We have scheduled UGI with SBFT to assess gastric emptying as well as small bowl transit time/ rule out obstruction. He already had a normal CT scan within last 10 months. I will be seeing him in the office on 12/15/2010. Follow-up by: Hart Carwin MD,  December 11, 2010 10:19 PM

## 2010-12-18 NOTE — Assessment & Plan Note (Signed)
Summary: constipation,abd pain, hx brodie. no show,copay/regina   Vital Signs:  Patient profile:   25 year old male Pulse rate:   68 / minute Pulse rhythm:   regular BP sitting:   100 / 68  (left arm) Cuff size:   regular  Vitals Entered By: June McMurray CMA Duncan Dull) (December 05, 2010 10:04 AM) CC: No intake of liquids for 48 hours   History of Present Illness Primary GI MD: [name] MD Primary Provider: Loreen Freud DO Requesting Provider: na Chief Complaint: No intake of liquids for 48 hours History of Present Illness:   [name] is a 25 year old male with past medical history of cerebral palsy. He is known to Dr. Juanda Chance for history of GERD and neurogenic dysphagia. Had has a PEG tube. Patient has a history of kyphosis and underwent spinal fusion at [facility] on November 20, 2010.  According to the mother patient was eating prior to the surgery but then developed post-op dysphagia followed by aspiration pneumonia. He is now recieving PEG tube feedings though speech therapy is working with him to correct swallowing problems. Following discharge from [facility] the patient continued to spike fevers and subsequently developed an episode of vomiting. Patient was admitted to [facility] with fever and possible mild ileus.  At the time of discharge on 12/01/10 the ileus had resolved and he was tolerating tube feedings at 40cc/hr. Two days ago patient developed abdominal tenderness, constipation. Went to [facility], abdominal films were normal, WBC normal and BMET was normal. Yesterday home health nurse apparently checked a tube feed residual which was high and also mentioned to the patient's mother that patient's bowel sounds were weak.He was given Reglan upon hospital discharge but mother hasn't tried it yet.  Mother now concerned that patient isn't able to tolerate tube feeds. No vomiting at home since discharge from [facility]..    GI Review of Systems      Denies abdominal pain, acid  reflux, belching, bloating, chest pain, dysphagia with liquids, dysphagia with solids, heartburn, loss of appetite, nausea, vomiting, vomiting blood, weight loss, and  weight gain.        Denies anal fissure, black tarry stools, change in bowel habit, constipation, diarrhea, diverticulosis, fecal incontinence, heme positive stool, hemorrhoids, irritable bowel syndrome, jaundice, light color stool, liver problems, rectal bleeding, and  rectal pain.  Current Medications (verified): 1)  Depakote Sprinkles 125 Mg Cpsp (Divalproex Sodium) .... Take 2 Capsule in The Am and 4 Capsules Qpm Via G Tube**generic Ok** 2)  Paxil 20 Mg Tabs (Paroxetine Hcl) .Marland Kitchen.. 1 By Mouth Qd 3)  Zyprexa 5 Mg Tabs (Olanzapine) .Marland Kitchen.. 1 By Mouth At Bedtime 4)  Alprazolam 0.5 Mg  Tbdp (Alprazolam) .... Take One-Half To One Tablet As Needed For Agitation/anxiety. 5)  Power Economist .... Dispense One Heritage manager. 6)  Dynavox .Marland Kitchen.. 343.2 7)  A Pair of Deep Lateral Supports .... As Directed Dx Cerebral Palsy 8)  Valium 5 Mg Tabs (Diazepam) .Marland Kitchen.. 1 By Mouth Two Times A Day As Needed 9)  Retin-A 0.1 % Crea (Tretinoin) .... Apply At Bedtime 10)  Albuterol Sulfate (2.5 Mg/63ml) 0.083% Nebu (Albuterol Sulfate) .Marland Kitchen.. 1 Neb Every 6 Hours As Needed For Chest Congestion 11)  Oxycodone Hcl 5 Mg Tabs (Oxycodone Hcl) .Marland Kitchen.. 1-2 Tablets Every 4-6 Hours 12)  Coats Aloe Vera  Liqd (Aloe) .... By Mouth As Directed 13)  Guaifenesin 100 Mg/70ml Syrp (Guaifenesin) .... 5 Ml in Tube Every 4 Hours As Needed  14)  Scandishake (Lactose Free)  Pack (Nutritional Supplements) .Marland Kitchen.. 40 Ml/hr in Tube Every 24 Hours 15)  Avelox 400 Mg/216ml Soln (Moxifloxacin Hcl in Nacl) .... 400 Ml in Tube Once Daily 16)  Reglan 5 Mg Tabs (Metoclopramide Hcl) .Marland Kitchen.. 1 Tablet in Tube Every 8 Hours As Needed 17)  Acetaminophen 500 Mg Tabs (Acetaminophen) .Marland Kitchen.. 1 or 2 Tablets in Tube Every6 Hours As Needed 18)  Atenolol 50 Mg Tabs (Atenolol) .... 1/2 Tablet in Tube Two  Times A Day  Allergies (verified): 1)  ! Sulfa 2)  ! Bactrim  Past History:  Past Medical History: 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) COUGH (ICD-786.2) HIP PAIN, BILATERAL (ICD-719.45) NEVI, MULTIPLE (ICD-216.9) DYSURIA (ICD-788.1) PREOPERATIVE EXAMINATION (ICD-V72.84) UNSPECIFIED PRE-OPERATIVE EXAMINATION (ICD-V72.84) FAMILY HISTORY BREAST CANCER 1ST DEGREE RELATIVE <50 (ICD-V16.3) FAMILY HISTORY OF ASTHMA (ICD-V17.5) INCONTINENCE OF FECES (ICD-787.6) UNSPECIFIED URINARY INCONTINENCE (ICD-788.30) OTHER KYPHOSIS (ICD-737.19) FACIAL RASH (ICD-782.1) AFTERCARE, LONG-TERM USE, MEDICATIONS NEC (ICD-V58.69) PALSY, INFANTILE CEREBRAL, 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 g tube s/p Baclofen pump with revision x 1 s/p PSF T4-L4 03/1999 Hamstring release x 2 Spinal Fusion Toe Surgery x 2 Eye Surgery x 2 Tonsillectomy  Spinal Fusion from T-9 to C6 at [facility], Dr. Sharon Seller Hey-07/2010  Family History: Reviewed history from 08/01/2010 and no changes required. Family History of Asthma Family History Breast cancer : Maternal Grandmother Family History of Prostate CA: Maternal Grandfather Family History of Colon Cancer: Great Grandmother Family History of Colon Polyps: Father Family History of Heart Disease: Paternal Grandfather  Social History: Reviewed history from 05/21/2008 and no changes required. Single Never Smoked Alcohol use-no Drug use-no Lives with  parents wheelchair bound  Review of Systems       Unobtainable  Physical Exam  General:  White male in wheelchair Eyes:  Conjunctive pale Mouth:  Slightly dry but keeps mouth breathing Lungs:  Dimished breath sounds bilateral bases. Heart:  RRR. Abdomen:  Soft, nondistended with normoactive bowel sounds. There doesn't appear to be any tenderness. Peg site looks healthy. Tube easilty turns 360 degrees. Tube flushes without difficulty.  Rectal:  Not impacted. Scant light brown, heme negative stool Skin:  Intact without significant lesions or rashes.   Impression & Recommendations:  Problem # 1:  FEEDING PROBLEM (ICD-783.3) Assessment Deteriorated PEG tube is functioning. Abdominal examination is benign. Abdominal xray yesterday was normal. Patient hasn't had any futher vomiting since hospital discharge a couple of days ago. Mother is concerned about the high tube feed residual obtained yesterday by home health nurse. She is concerned that patient isn't tolerating tube feedings, that feedings are causing abdominal pain and worsening reflux. Advised to give Reglan 5mg  Q 8 hours via PEG x 48 hours. Dilute tube feedings with 50%water for 48 hours. Dulcolax suppository daily as needed. Resume  tube feedings slowly. Start at 20cc hours and advance by 10cc every few hours until back at goal. Since it is Friday afternoon I will contact the patient before I leave this afternoon to see how things are going.   Problem # 2:  CONSTIPATION (ICD-564.00) Assessment: Deteriorated Start daily Dulcolax suppository as needed.  Problem # 3:  ANEMIA-UNSPECIFIED (ICD-285.9) Hemoglobin was normal in November.  Suspect this is post-op anemia. Hemoglobin is low but stable. Heme negative in hospital 11/27/10, in ER yesterday and also in office today.   Problem # 4:  GERD (ICD-530.81) Assessment: Deteriorated Mother discontinued PPI and is giving patient Aloe Vera.   Patient Instructions: 1)  Give Reglan 5mg   every 8 hours through PEG tube for the next 48 hours 2)  For the next 48 hours decrease concentration of tube feeding supplement by 50% by adding water .  3)  Dulcolax suppository daily until bowel movement then may use daily as needed for constipation. 4)  We will call you this afternoon. If in the meantime you have any questions or problems, call [phone] and ask for [name] (medical assistant) 5)  Copy sent to: Dr. Loreen Freud.  Appended Document: constipation,abd pain, hx brodie. no show,copay/regina I did speak with patient's mother on the evening of 12/05/10. I recommeded restarting daily PPI as patient has known reflux disease. Furthermore, with recent vomiting and ileus, patient at even greater risk of esophagitis.

## 2010-12-18 NOTE — Medication Information (Signed)
Summary: [employer] Medicare Prescription Plan  [employer] Medicare Prescription Plan   Imported By: Kassie Mends 12/11/2010 10:43:40  _____________________________________________________________________  External Attachment:    Type:   Image     Comment:   External Document

## 2010-12-18 NOTE — Medication Information (Signed)
Summary: Depakote dismissed/[employer]  Depakote dismissed/[employer]   Imported By: Sherian Rein 12/10/2010 08:55:06  _____________________________________________________________________  External Attachment:    Type:   Image     Comment:   External Document

## 2010-12-18 NOTE — Progress Notes (Signed)
   Phone Note Other Incoming   Caller: Dr Juanda Chance Summary of Call: Received a call from Dr. Juanda Chance. Need to cancel patient's UGI and schedule a color flow Doppler study of portal vein for partial port vein thrombosis on CT scan. Cancelled UGI with Tobi Bastos. Scheduled patient for doppler study at [facility] on 01/15/11 at 8:15 AM for 8:30 AM procedure. NPO after midnight(Kathy). Initial call taken by: Jesse Fall RN,  December 12, 2010 1:41 PM  Follow-up for Phone Call        Spoke with patient's mother and gave her the appointment for the Doppler study. Instructions given to be NPO after midnight. 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

## 2010-12-19 ENCOUNTER — Telehealth: Payer: Self-pay | Admitting: Family Medicine

## 2010-12-23 ENCOUNTER — Encounter: Payer: Self-pay | Admitting: Family Medicine

## 2010-12-23 ENCOUNTER — Telehealth: Payer: Self-pay | Admitting: Family Medicine

## 2010-12-24 ENCOUNTER — Encounter: Payer: Self-pay | Admitting: Family Medicine

## 2010-12-24 ENCOUNTER — Telehealth (INDEPENDENT_AMBULATORY_CARE_PROVIDER_SITE_OTHER): Payer: Self-pay | Admitting: *Deleted

## 2010-12-24 NOTE — Miscellaneous (Signed)
Summary: Physician's Change Order Request/[facility]  Physician's Change Order Request/[facility]   Imported By: Maryln Gottron 12/18/2010 09:49:45  _____________________________________________________________________  External Attachment:    Type:   Image     Comment:   External Document

## 2010-12-24 NOTE — Progress Notes (Signed)
Summary: Verbal order request  Phone Note From Other Clinic   Caller: Speech Pathology--Janet  [phone] Summary of Call: Marylu Lund left message on triage that she sees that the patient for speech therapy--swallowing. She notes that she would like to extend the orders x2 weeks. Ok to provide verbal extension? Please advise. Initial call taken by: Lucious Groves CMA,  December 19, 2010 4:20 PM  Follow-up for Phone Call        yes Follow-up by: Loreen Freud DO,  December 19, 2010 4:41 PM  Additional Follow-up for Phone Call Additional follow up Details #1::        spoke with patient Jan gave her the verbal authorization to continue speech therapy Additional Follow-up by: Almeta Monas CMA Duncan Dull),  December 19, 2010 4:47 PM

## 2010-12-24 NOTE — Letter (Signed)
Summary: Medical Necessity for Suction Machine/[facility]  Medical Necessity for Suction Machine/[facility]   Imported By: Maryln Gottron 12/18/2010 09:47:28  _____________________________________________________________________  External Attachment:    Type:   Image     Comment:   External Document

## 2010-12-24 NOTE — Assessment & Plan Note (Signed)
Summary: follow up    History of Present Illness Visit Type: Follow-up Visit Primary GI MD: [name] MD Primary Provider: Loreen Freud DO Requesting Provider: na Chief Complaint: F/u from visit with Gunnar Fusi and CT scan  History of Present Illness:   This is a 25 year old white male with cerebral palsy who is wheelchair-bound. He has severe gastroesophageal reflux disease, vesicoureteral reflux, and is status post recent spinal fusion at [facility] with placement of a Harrington rod. He had a post operative ileus requiring increased laxatives. He has a history of gastroparesis. A gastric emptying scan in 2007 showed 33% retention in 120 minutes. An upper GI series in April 2008 showed reflux. He continues with limited oral intake and most of his feedings via PEG tube. A modified barium swallow in May 2010 showed moderately severe dysphagia with mild pharyngeal dysphagia. He was hospitalized on November 27, 2010 with aspiration pneumonia. Currently, his constipation is treated with MiraLax 17 g daily. A CT scan of the abdomen last week showed a small right pleural effusion and a new partial thrombus at the branch of the right portal vein anteriorly. The main portal vein is patent. Patient had a normal spleen and pancreas and his liver function tests have been normal.   GI Review of Systems      Denies abdominal pain, acid reflux, belching, bloating, chest pain, dysphagia with liquids, dysphagia with solids, heartburn, loss of appetite, nausea, vomiting, vomiting blood, weight loss, and  weight gain.        Denies anal fissure, black tarry stools, change in bowel habit, constipation, diarrhea, diverticulosis, fecal incontinence, heme positive stool, hemorrhoids, irritable bowel syndrome, jaundice, light color stool, liver problems, rectal bleeding, and  rectal pain.    Current Medications (verified): 1)  Depakote Sprinkles 125 Mg Cpsp (Divalproex Sodium) .... Take 2 Capsule in The Am and 4 Capsules Qpm  Via G Tube**generic Ok** 2)  Paxil 20 Mg Tabs (Paroxetine Hcl) .Marland Kitchen.. 1 By Mouth Qd 3)  Zyprexa 5 Mg Tabs (Olanzapine) .Marland Kitchen.. 1 By Mouth At Bedtime 4)  Power [employer] .... Dispense One [employer] manager. 5)  Dynavox .Marland Kitchen.. 343.2 6)  A Pair of Deep Lateral Supports .... As Directed Dx Cerebral Palsy 7)  Valium 5 Mg Tabs (Diazepam) .Marland Kitchen.. 1 By Mouth Two Times A Day As Needed 8)  Retin-A 0.1 % Crea (Tretinoin) .... Apply At Bedtime 9)  Albuterol Sulfate (2.5 Mg/41ml) 0.083% Nebu (Albuterol Sulfate) .Marland Kitchen.. 1 Neb Every 6 Hours As Needed For Chest Congestion 10)  Scandishake (Lactose Free)  Pack (Nutritional Supplements) .Marland Kitchen.. 40 Ml/hr in Tube Every 24 Hours 11)  Reglan 5 Mg Tabs (Metoclopramide Hcl) .Marland Kitchen.. 1 Tablet in Tube Every 8 Hours As Needed 12)  Acetaminophen 500 Mg Liquid (Acetaminophen) .... in Tube Every 6 Hours As Needed 13)  Atenolol 50 Mg Tabs (Atenolol) .... As Needed 14)  Nexium 40 Mg Pack (Esomeprazole Magnesium) .... As Directed 15)  Miralax  Pack (Polyethylene Glycol 3350) .... As Directed 16)  Diazepam 5 Mg Tabs (Diazepam) .... One Tablet in The Evening and Every 8 Hours As Needed  Allergies (verified): 1)  ! Sulfa 2)  ! 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) COUGH (ICD-786.2) HIP  PAIN, BILATERAL (ICD-719.45) NEVI, MULTIPLE (ICD-216.9) DYSURIA (ICD-788.1) PREOPERATIVE EXAMINATION (ICD-V72.84) UNSPECIFIED PRE-OPERATIVE EXAMINATION (ICD-V72.84) FAMILY HISTORY BREAST CANCER 1ST DEGREE RELATIVE <50 (ICD-V16.3) FAMILY HISTORY OF ASTHMA  (ICD-V17.5) INCONTINENCE OF FECES (ICD-787.6) UNSPECIFIED URINARY INCONTINENCE (ICD-788.30) OTHER KYPHOSIS (ICD-737.19) FACIAL RASH (ICD-782.1) AFTERCARE, LONG-TERM USE, MEDICATIONS NEC (ICD-V58.69) PALSY, INFANTILE CEREBRAL, 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 g tube s/p Baclofen pump with revision x 1 s/p PSF T4-L4 03/1999 Hamstring release x 2 Spinal Fusion Toe Surgery x 2 Eye Surgery x 2 Tonsillectomy  Spinal Fusion from T-9 to C6 at [facility], Dr. Sharon Seller [place]-07/2010  Family History: Reviewed history from 08/01/2010 and no changes required. Family History of Asthma Family History Breast cancer : Maternal Grandmother Family History of Prostate CA: Maternal Grandfather Family History of Colon Cancer: Great Grandmother Family History of Colon Polyps: Father Family History of Heart Disease: Paternal Grandfather  Social History: Reviewed history from 05/21/2008 and no changes required. Single Never Smoked Alcohol use-no Drug use-no Lives with parents wheelchair bound  Review of Systems  The patient denies allergy/sinus, anemia, anxiety-new, arthritis/joint pain, back pain, blood in urine, breast changes/lumps, change in vision, confusion, cough, coughing up blood, depression-new, fainting, fatigue, fever, headaches-new, hearing problems, heart murmur, heart rhythm changes, itching, menstrual pain, muscle pains/cramps, night sweats, nosebleeds, pregnancy symptoms, shortness of breath, skin rash, sleeping problems, sore throat, swelling of feet/legs, swollen lymph glands, thirst - excessive , urination - excessive , urination changes/pain, urine leakage, vision changes, and voice change.         Pertinent positive and negative review of systems were noted in the above HPI. All other ROS was otherwise negative.   Vital Signs:  Patient profile:   25 year old  male Pulse rate:   88 / minute Pulse rhythm:   regular BP sitting:   120 / 76  (left arm) Cuff size:   regular  Vitals Entered By: Ok Anis CMA (December 15, 2010 8:41 AM)  Physical Exam  General:  cerebral palsy with deformities. Wheelchair-bound. Eyes:  PERRLA, no icterus. Neck:  Supple; no masses or thyromegaly. Lungs:  Clear throughout to auscultation. Heart:  Regular rate and rhythm; no murmurs, rubs,  or bruits. Abdomen:  in a reclining position, bowel sounds are normal. There is no distention. No tenderness. Perkins gastrostomy site appears clean with a 24 French PEG in left upper quadrant. Extremities:  No clubbing, cyanosis, edema or deformities noted. Skin:  Intact without significant lesions or rashes. Psych:  nonverbal.   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 This probably occurred perioperatively but does not seem to be causing any impairment of his liver function. There is no evidence of portal hypertension. He is scheduled for Doppler studies of his portal vein to further define the defect. I do not anticipate the need for anti-coagulation.  Problem # 2:  FEEDING PROBLEM (ICD-783.3) Patient has an improving ileus. This was most likely due to postoperative changes. He will increase his Ensure to bolus feedings of Ensure Plus 3 cans a day and start nocturnal feedings from 8 PM to 8 AM at 30 cc an hour to improve his caloric intake.  Problem # 3:  CONSTIPATION (ICD-564.00) Patient is to continue MiraLax 17 g daily.  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 He is to continue Nexium  40 mg per gastrostomy and Reglan 10 mg at bedtime.  Patient Instructions: 1)  increase tube feedings as per recommendations in the assesment part of this note 2)  Antireflux measures. 3)  Continue Nexium and Reglan. 4)   Keep appointment for color flow Doppler of the portal vein. 5)  Repeat blood count in 4 weeks. 6)  Copy sent to : Dr Laury Axon 7)  The medication list was reviewed and reconciled.  All changed / newly prescribed medications were explained.  A complete medication list was provided to the patient / caregiver.

## 2010-12-25 ENCOUNTER — Encounter: Payer: Self-pay | Admitting: Family Medicine

## 2010-12-26 ENCOUNTER — Telehealth (INDEPENDENT_AMBULATORY_CARE_PROVIDER_SITE_OTHER): Payer: Self-pay | Admitting: *Deleted

## 2010-12-29 ENCOUNTER — Ambulatory Visit: Payer: Medicare Other | Admitting: Family Medicine

## 2010-12-30 NOTE — Progress Notes (Signed)
Summary: Patient not well  Phone Note Outgoing Call   Call placed by: Almeta Monas CMA Duncan Dull),  December 26, 2010 3:48 PM Call placed to: Patient Summary of Call: spoke with Bonita Quin and she stated [name] has been crying all day, and she does not know what to do. Thought it was from the Generic Zyprexa--I advised I called in the Brand Name. she stated she should have had an apt, thinks she needs to go to the ER, I advised patient  to go ahead and go to the ER for an Evaluation...she voiced understanding and agreed.... Initial call taken by: Almeta Monas CMA Duncan Dull),  December 26, 2010 3:53 PM

## 2010-12-30 NOTE — Miscellaneous (Signed)
Summary: Physician's Orders/[facility]  Physician's Orders/[facility]   Imported By: Maryln Gottron 12/26/2010 13:51:41  _____________________________________________________________________  External Attachment:    Type:   Image     Comment:   External Document

## 2010-12-30 NOTE — Progress Notes (Signed)
Summary: refill  Phone Note Refill Request Message from:  Fax from Pharmacy on December 26, 2010 2:50 PM  Refills Requested: Medication #1:  ZYPREXA 5 MG TABS 1 by mouth at bedtime [facility] - fax [phone]  --moter is requesting BRAND only on next fill --dispense as written  Initial call taken by: Okey Regal Spring,  December 26, 2010 2:52 PM    Prescriptions: ZYPREXA 5 MG TABS (OLANZAPINE) 1 by mouth at bedtime Brand medically necessary #30 Tablet x 1   Entered by:   Almeta Monas CMA (AAMA)   Authorized by:   Loreen Freud DO   Signed by:   Almeta Monas CMA (AAMA) on 12/26/2010   Method used:   Faxed to ...       [facility] (retail)       [address]       [address]       [place], [place]  [address]  [place]       Ph: [phone]       Fax: [phone]   RxID:   [id number]

## 2010-12-30 NOTE — Miscellaneous (Signed)
Summary: Certification and Plan of care/[facility]  Certification and Plan of care/[facility]   Imported By: Maryln Gottron 12/24/2010 13:47:46  _____________________________________________________________________  External Attachment:    Type:   Image     Comment:   External Document

## 2010-12-30 NOTE — Progress Notes (Signed)
Summary:  not feeling well  Phone Note Call from Patient Call back at [facility] Phone [phone]   Caller: Patient Summary of Call: Pt mom states that Pt c/o chills, fatigue, very sad and easily crying. Pt last experience these symptoms in [place] when blood was low. Pt inform mom that he needed to go to ED to get blood. Pls advise...............Marland KitchenFelecia Deloach CMA  December 23, 2010 3:53 PM   Follow-up for Phone Call        can home health draw a CBCD?   Follow-up by: Loreen Freud DO,  December 23, 2010 4:11 PM  Additional Follow-up for Phone Call Additional follow up Details #1::        spoke with Shawna Orleans at [facility] and she stated they will go out in the morning to draw the cbcd.Marland KitchenMarland KitchenMarland KitchenMade Linda aware and she voiced understanding Additional Follow-up by: Almeta Monas CMA Duncan Dull),  December 23, 2010 4:31 PM

## 2010-12-30 NOTE — Progress Notes (Signed)
Summary: Prior Auth approved depakote  Phone Note Refill Request   Refills Requested: Medication #1:  DEPAKOTE SPRINKLES 125 MG CPSP Take 2 capsule in the am and 4 capsules qpm via g tube**Generic ok** Pt mom called back indicating that generic med is clumpy up in g-tube with water and is not dissolving all the substance in syringe. Pt mom would like to go back to brand name. Left Pt detail message will work on getting med approved.Marland KitchenMarland KitchenMarland KitchenFelecia Deloach CMA  December 24, 2010 5:13 PM    Follow-up for Phone Call        Prior Auth Approved 12-25-10 until 12-24-10, Pt mom notified, Rx sent to pharmacy.......Marland KitchenFelecia Deloach CMA  December 25, 2010 11:49 AM     New/Updated Medications: DEPAKOTE SPRINKLES 125 MG CPSP (DIVALPROEX SODIUM) Take 2 capsule in the am and 4 capsules qpm via g tube [BMN] Prescriptions: DEPAKOTE SPRINKLES 125 MG CPSP (DIVALPROEX SODIUM) Take 2 capsule in the am and 4 capsules qpm via g tube Brand medically necessary #180 x 3   Entered by:   Jeremy Johann CMA   Authorized by:   Loreen Freud DO   Signed by:   Jeremy Johann CMA on 12/25/2010   Method used:   Re-Faxed to ...       [facility] (retail)       [address]       [address]       [place], [place]  [address]  [place]       Ph: [phone]       Fax: [phone]   RxID:   [id number] DEPAKOTE SPRINKLES 125 MG CPSP (DIVALPROEX SODIUM) Take 2 capsule in the am and 4 capsules qpm via g tube  #180 x 3   Entered by:   Jeremy Johann CMA   Authorized by:   Loreen Freud DO   Signed by:   Jeremy Johann CMA on 12/25/2010   Method used:   Faxed to ...       [facility] (retail)       [address]       [address]       [place], [place]  [address]  [place]       Ph: [phone]       Fax: [phone]   RxID:   [id number]

## 2011-01-02 ENCOUNTER — Encounter: Payer: Self-pay | Admitting: Family Medicine

## 2011-01-06 ENCOUNTER — Telehealth: Payer: Self-pay | Admitting: Family Medicine

## 2011-01-08 NOTE — Miscellaneous (Signed)
Summary: Physician's Orders/[facility]  Physician's Orders/[facility]   Imported By: Maryln Gottron 12/30/2010 10:15:34  _____________________________________________________________________  External Attachment:    Type:   Image     Comment:   External Document

## 2011-01-08 NOTE — Medication Information (Signed)
Summary: PA & Approval for Depakote Sprinkle Cap  PA & Approval for Depakote Sprinkle Cap   Imported By: Maryln Gottron 12/30/2010 10:12:26  _____________________________________________________________________  External Attachment:    Type:   Image     Comment:   External Document

## 2011-01-08 NOTE — Miscellaneous (Signed)
Summary: OT Orders/[facility]  OT Orders/[facility]   Imported By: Lanelle Bal 12/29/2010 14:24:32  _____________________________________________________________________  External Attachment:    Type:   Image     Comment:   External Document

## 2011-01-13 ENCOUNTER — Ambulatory Visit: Payer: Managed Care, Other (non HMO) | Admitting: Family Medicine

## 2011-01-13 NOTE — Progress Notes (Signed)
Summary: refill  Phone Note Refill Request Message from:  Fax from Pharmacy on January 06, 2011 1:13 PM  Refills Requested: Medication #1:  DIAZEPAM 5 MG TABS one tablet in the evening and every 8 hours as needed. [facility] - fax [phone]   Initial call taken by: Okey Regal Spring,  January 06, 2011 1:14 PM  Follow-up for Phone Call        last filled 04/05/10 and seen 12/15/10 please advise Follow-up by: Almeta Monas CMA Duncan Dull),  January 06, 2011 1:21 PM  Additional Follow-up for Phone Call Additional follow up Details #1::        refill for 6 months Additional Follow-up by: Loreen Freud DO,  January 06, 2011 1:25 PM    Prescriptions: VALIUM 5 MG TABS (DIAZEPAM) 1 by mouth two times a day as needed  #60 x 5   Entered by:   Almeta Monas CMA (AAMA)   Authorized by:   Loreen Freud DO   Signed by:   Almeta Monas CMA (AAMA) on 01/06/2011   Method used:   Print then Give to Patient   RxID:   [id number]

## 2011-01-15 ENCOUNTER — Ambulatory Visit: Payer: Managed Care, Other (non HMO) | Admitting: Family Medicine

## 2011-01-20 ENCOUNTER — Telehealth: Payer: Self-pay | Admitting: Internal Medicine

## 2011-01-20 ENCOUNTER — Encounter: Payer: Self-pay | Admitting: Family Medicine

## 2011-01-20 ENCOUNTER — Ambulatory Visit (INDEPENDENT_AMBULATORY_CARE_PROVIDER_SITE_OTHER): Payer: Managed Care, Other (non HMO) | Admitting: Family Medicine

## 2011-01-20 VITALS — BP 104/68 | HR 74 | Temp 98.5°F

## 2011-01-20 DIAGNOSIS — K219 Gastro-esophageal reflux disease without esophagitis: Secondary | ICD-10-CM

## 2011-01-20 DIAGNOSIS — G808 Other cerebral palsy: Secondary | ICD-10-CM

## 2011-01-20 DIAGNOSIS — J329 Chronic sinusitis, unspecified: Secondary | ICD-10-CM | POA: Insufficient documentation

## 2011-01-20 DIAGNOSIS — R1310 Dysphagia, unspecified: Secondary | ICD-10-CM

## 2011-01-20 MED ORDER — DOXYCYCLINE HYCLATE 100 MG PO TABS: 100.0000 mg | ORAL_TABLET | Freq: Two times a day (BID) | ORAL | Status: AC

## 2011-01-20 NOTE — Telephone Encounter (Signed)
Please schedule Barium esophagram ( not MBS,he had  It in 03/2009), "Dysphagia, r/o stricture, severe GERD"

## 2011-01-20 NOTE — Progress Notes (Signed)
  Subjective:    Patient ID: [name], male    DOB: Apr 01, 1986, 25 y.o.   MRN: [id number]  HPI  Pt here with mom--Dysphagia and choking is improving but not 100%.  + sinus congestion, low grade fever for for 10 days .   No otc meds.   Review of Systems  Constitutional: Negative.   HENT: Positive for congestion, rhinorrhea and postnasal drip. Negative for ear pain and neck pain.   Respiratory: Positive for choking. Negative for shortness of breath and wheezing.   Cardiovascular: Negative.   + dysphagia and choking      Objective:   Physical Exam  Constitutional: He appears well-developed. No distress.  HENT:  Head: Normocephalic.  Nose: Mucosal edema and sinus tenderness present. Right sinus exhibits maxillary sinus tenderness and frontal sinus tenderness. Left sinus exhibits maxillary sinus tenderness and frontal sinus tenderness.  Mouth/Throat: Oropharynx is clear and moist and mucous membranes are normal. No posterior oropharyngeal erythema.  Neck: Neck supple.  Cardiovascular: Normal rate and normal heart sounds.   Pulmonary/Chest: Breath sounds normal. No respiratory distress.  Lymphadenopathy:    He has no cervical adenopathy.  Skin: Skin is warm and dry. He is not diaphoretic.          Assessment & Plan:

## 2011-01-20 NOTE — Assessment & Plan Note (Signed)
PT ordered per Dr Elvina Sidle

## 2011-01-20 NOTE — Letter (Signed)
Summary: [facility]  [facility]   Imported By: Maryln Gottron 01/15/2011 12:20:50  _____________________________________________________________________  External Attachment:    Type:   Image     Comment:   External Document

## 2011-01-20 NOTE — Telephone Encounter (Signed)
Spoke with patients mother. He is still having swallowing problems and choking. He is some better but still having to eat mashed potatoes, pudding, ice cream, etc. He saw Dr. Laury Axon today and she wanted to see what Dr. Juanda Chance wanted to do. ? Swallow study or EGD. Please, advise.

## 2011-01-20 NOTE — Assessment & Plan Note (Signed)
Mom will make appointment with Dr Juanda Chance

## 2011-01-21 NOTE — Telephone Encounter (Signed)
Called radiology and spoke with Alinda Money to find out what to order in [facility] for Barium Esophagram. Per Alinda Money DX esp is the procedure to order. Scheduled patient for 01/26/11 at 11:00 AM with 10:45 AM arrival. May eat a light breakfast before 9:00 AM. [name] aware.

## 2011-01-26 ENCOUNTER — Ambulatory Visit (HOSPITAL_COMMUNITY)
Admission: RE | Admit: 2011-01-26 | Discharge: 2011-01-26 | Disposition: A | Discharge status: Home / Self Care | Payer: Managed Care, Other (non HMO) | Source: Ambulatory Visit | Attending: Internal Medicine | Admitting: Internal Medicine

## 2011-01-26 DIAGNOSIS — R1310 Dysphagia, unspecified: Secondary | ICD-10-CM | POA: Insufficient documentation

## 2011-01-26 DIAGNOSIS — K219 Gastro-esophageal reflux disease without esophagitis: Secondary | ICD-10-CM

## 2011-01-26 DIAGNOSIS — M4000 Postural kyphosis, site unspecified: Secondary | ICD-10-CM | POA: Insufficient documentation

## 2011-01-27 ENCOUNTER — Telehealth: Payer: Self-pay | Admitting: *Deleted

## 2011-01-27 ENCOUNTER — Other Ambulatory Visit: Payer: Self-pay | Admitting: Family Medicine

## 2011-01-27 MED ORDER — DIAZEPAM 5 MG PO TABS: 5.0000 mg | ORAL_TABLET | ORAL | Status: DC

## 2011-01-27 NOTE — Telephone Encounter (Signed)
[name] given results. She wants to know why he is choking on table foods. She also wants to know if this shows any problems with esophagus such as stricture. Please, advise.

## 2011-01-27 NOTE — Telephone Encounter (Signed)
Rx verbally given to pharmacy per Rx on 01-05-11,VALIUM 5 MG TABS (DIAZEPAM) 1 by mouth two times a day as needed #60 x 5.Felecia Eastyn Dattilo CMA

## 2011-01-27 NOTE — Telephone Encounter (Signed)
Message copied by Jesse Fall on Tue Jan 27, 2011  8:55 AM ------      Message from: [name]      Created: Mon Jan 26, 2011  9:21 PM       Please call pt's mother with results. This time ,barium is flowing normally into the stomach, without delay or aspiration. Please continue antireflux regimen.

## 2011-01-27 NOTE — Telephone Encounter (Signed)
[name](Linda) given Dr Regino Schultze answer to her questions.

## 2011-01-27 NOTE — Telephone Encounter (Signed)
Yes, the Ba esophagram would show a stricture, may be the problem is neurological in terms of his swallowing  mechanism is impaired. If he continues to choke on foods then he needs  to see a speech pathologist again. It is starting to be a chronic problem. And I am afraid that we may have to restrict his oral intake in favor of PEG feedings.

## 2011-02-06 LAB — BASIC METABOLIC PANEL
BUN: 8 mg/dL (ref 6–23)
CO2: 29 mEq/L (ref 19–32)
Calcium: 9.3 mg/dL (ref 8.4–10.5)
Chloride: 106 mEq/L (ref 96–112)
Creatinine, Ser: 0.6 mg/dL (ref 0.4–1.5)
GFR calc Af Amer: >60 mL/min (ref >60)
GFR calc non Af Amer: >60 mL/min (ref >60)
Glucose, Bld: 79 mg/dL (ref 70–99)
Potassium: 4.2 mEq/L (ref 3.5–5.1)
Sodium: 143 mEq/L (ref 135–145)

## 2011-02-06 LAB — POCT CARDIAC MARKERS
CKMB, poc: <1 ng/mL — ABNORMAL LOW (ref 1.0–8.0)
CKMB, poc: <1 ng/mL — ABNORMAL LOW (ref 1.0–8.0)
Myoglobin, poc: 47.5 ng/mL (ref 12–200)
Myoglobin, poc: 49 ng/mL (ref 12–200)
Troponin i, poc: <0.05 ng/mL (ref 0.00–0.09)
Troponin i, poc: <0.05 ng/mL (ref 0.00–0.09)

## 2011-02-06 LAB — DIFFERENTIAL
Basophils Absolute: 0.2 10*3/uL — ABNORMAL HIGH (ref 0.0–0.1)
Basophils Relative: 3 % — ABNORMAL HIGH (ref 0–1)
Eosinophils Absolute: 0 10*3/uL (ref 0.0–0.7)
Eosinophils Relative: 1 % (ref 0–5)
Lymphocytes Relative: 36 % (ref 12–46)
Lymphs Abs: 2.4 10*3/uL (ref 0.7–4.0)
Monocytes Absolute: 0.4 10*3/uL (ref 0.1–1.0)
Monocytes Relative: 5 % (ref 3–12)
Neutro Abs: 3.5 10*3/uL (ref 1.7–7.7)
Neutrophils Relative %: 55 % (ref 43–77)

## 2011-02-06 LAB — CBC
HCT: 44.5 % (ref 39.0–52.0)
Hemoglobin: 15.1 g/dL (ref 13.0–17.0)
MCHC: 33.8 g/dL (ref 30.0–36.0)
MCV: 90.1 fL (ref 78.0–100.0)
Platelets: 213 10*3/uL (ref 150–400)
RBC: 4.94 MIL/uL (ref 4.22–5.81)
RDW: 11.8 % (ref 11.5–15.5)
WBC: 6.5 10*3/uL (ref 4.0–10.5)

## 2011-02-18 ENCOUNTER — Ambulatory Visit: Payer: Managed Care, Other (non HMO) | Attending: Orthopedic Surgery | Admitting: Physical Therapy

## 2011-02-18 DIAGNOSIS — G809 Cerebral palsy, unspecified: Secondary | ICD-10-CM | POA: Insufficient documentation

## 2011-02-18 DIAGNOSIS — M6281 Muscle weakness (generalized): Secondary | ICD-10-CM | POA: Insufficient documentation

## 2011-02-18 DIAGNOSIS — IMO0001 Reserved for inherently not codable concepts without codable children: Secondary | ICD-10-CM | POA: Insufficient documentation

## 2011-02-24 ENCOUNTER — Other Ambulatory Visit: Payer: Self-pay

## 2011-02-24 NOTE — Telephone Encounter (Signed)
Last seen 01/20/11 and filled 01/28/11 please advise     KP

## 2011-02-25 MED ORDER — OLANZAPINE 5 MG PO TABS: 5.0000 mg | ORAL_TABLET | Freq: Every day | ORAL | Status: DC

## 2011-02-27 ENCOUNTER — Other Ambulatory Visit: Payer: Self-pay

## 2011-02-27 MED ORDER — DOXYCYCLINE HYCLATE 100 MG PO TABS: 100.0000 mg | ORAL_TABLET | Freq: Two times a day (BID) | ORAL | Status: AC

## 2011-03-11 IMAGING — CR DG CHEST 2V
1 series · 1 of 1 positions shown · non-contrast
Comparison: Chest x-ray of 10/29/2009

CLINICAL DATA: Cough, evaluate for possible aspiration pneumonia

CHEST - 2 VIEW

[w chest lat]
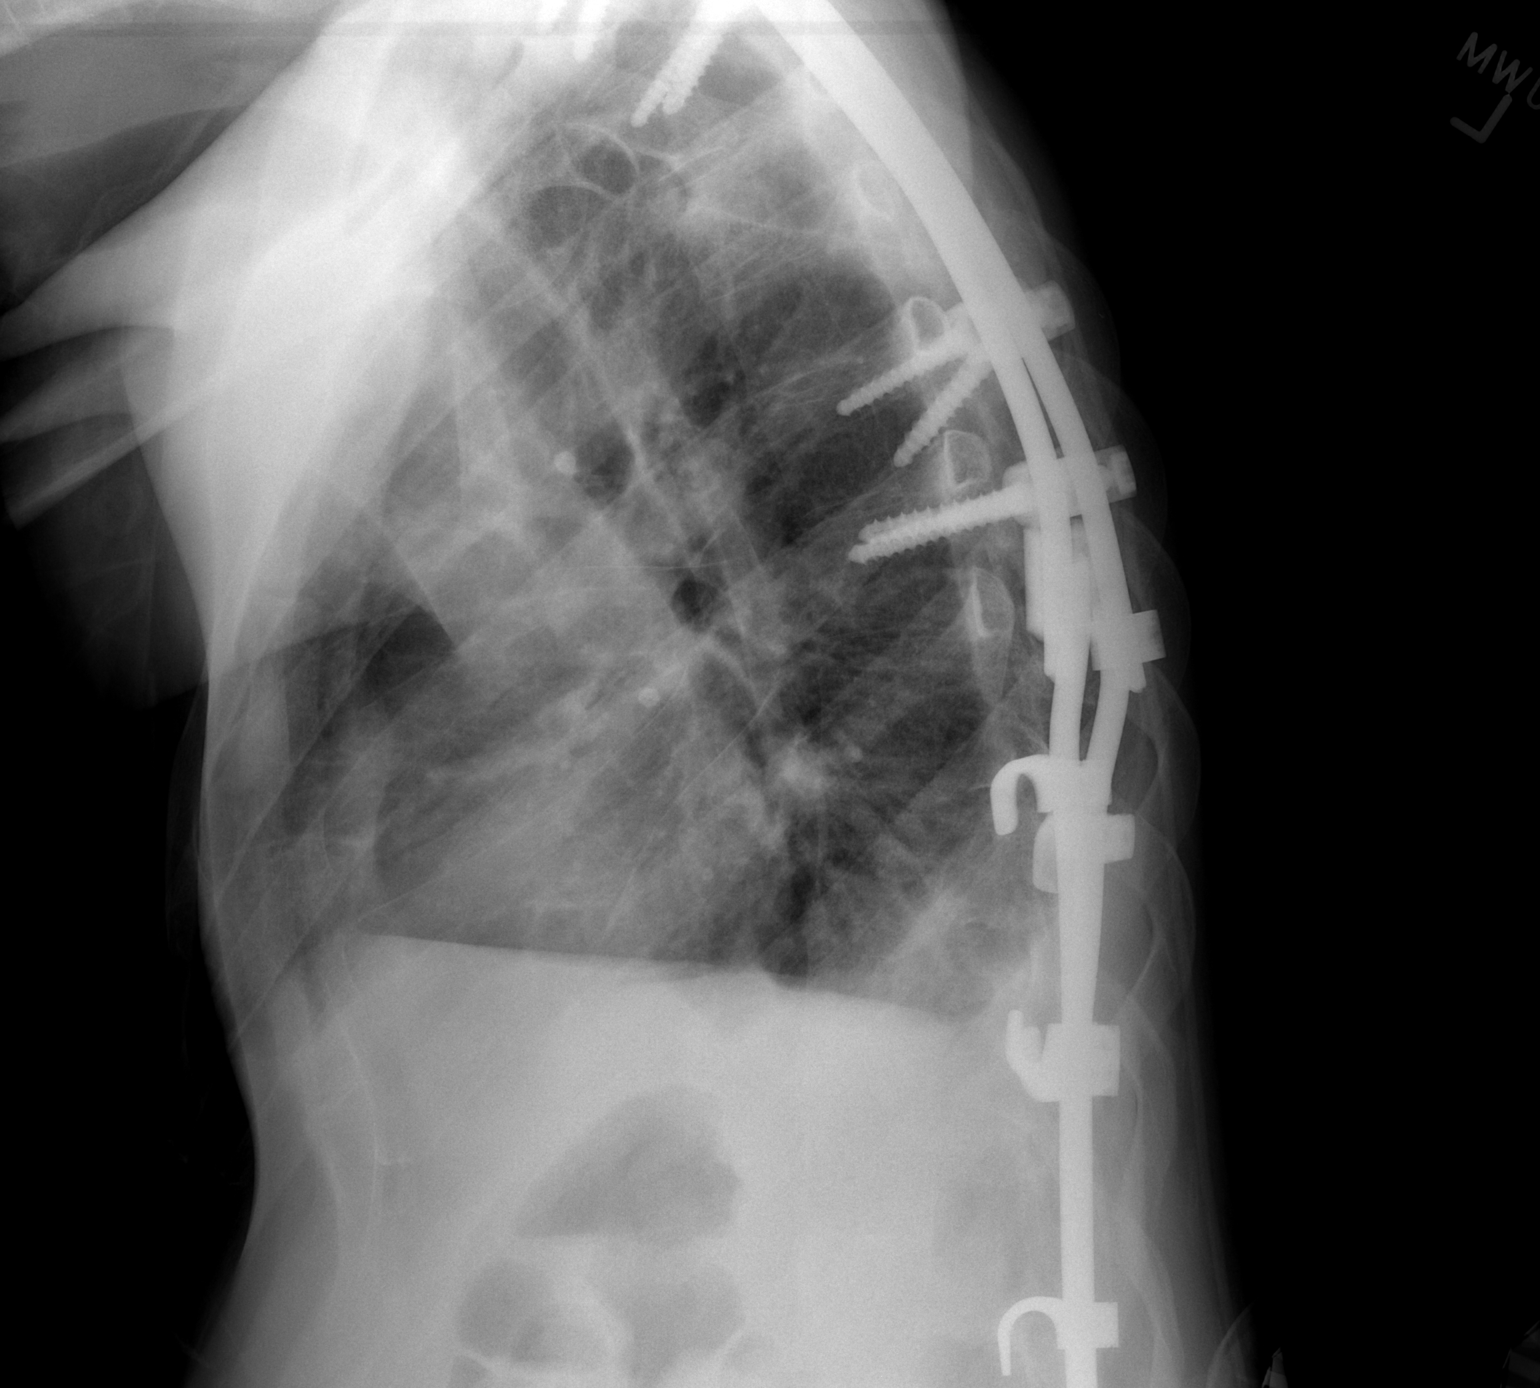

[1 of 1 positions shown; findings below may reference images not displayed]

FINDINGS: There is opacity posteriorly blunting the costophrenic
angles consistent with effusions.  Pneumonia is difficult to
exclude.  The heart is within upper limits normal in size.
Harrington rod fixation of the thoracolumbar spine is noted.
IMPRESSION: Opacity on the lateral view posteriorly consistent with effusions.
Cannot exclude posterior lower lobe pneumonia.

## 2011-03-16 ENCOUNTER — Telehealth: Payer: Self-pay | Admitting: Internal Medicine

## 2011-03-16 NOTE — Telephone Encounter (Signed)
The end of the tube is broken.  I have placed an end adapter at the front desk for the mother to come and pick up.  She is aware.

## 2011-03-17 ENCOUNTER — Ambulatory Visit: Payer: Medicare Other | Attending: Orthopedic Surgery | Admitting: Physical Therapy

## 2011-03-17 DIAGNOSIS — IMO0001 Reserved for inherently not codable concepts without codable children: Secondary | ICD-10-CM | POA: Insufficient documentation

## 2011-03-17 DIAGNOSIS — G809 Cerebral palsy, unspecified: Secondary | ICD-10-CM | POA: Insufficient documentation

## 2011-03-17 DIAGNOSIS — M6281 Muscle weakness (generalized): Secondary | ICD-10-CM | POA: Insufficient documentation

## 2011-03-20 NOTE — Discharge Summary (Signed)
[name], [name]               ACCOUNT NO.:  [id number]   MEDICAL RECORD NO.:  [id number]          PATIENT TYPE:  INP   LOCATION:  3742                         FACILITY:  [facility]   PHYSICIAN:  Bruce Rexene Edison. Swords, MD    DATE OF BIRTH:  1986/07/23   DATE OF ADMISSION:  04/19/2006  DATE OF DISCHARGE:  04/24/2006                                 DISCHARGE SUMMARY   STAT REPORT   DISCHARGE DIAGNOSES:  1. Atypical chest pain, unclear etiology.  2. 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  3. History of neurogenic bladder.  4. History of spinal fusion.  5. History of anxiety.  6. Acne.   DISCHARGE MEDICATIONS:  1. Nutrition per PEG.  2. Depakote 125 mg twice a day per PEG.  3. Reglan 10 mg per PEG 3 times daily.  4. AcipHex 20 mg b.i.d.  5. Zyprexa 10 mg q.h.s.  6. Paxil 20 mg q.h.s.  7. Inderal 15 mg b.i.d.  8. Doxycycline 100 mg daily.  9. Retin-A topically for acne control.   FOLLOW-UP PLANS:  Dr. Laury Axon in 1-2 weeks.   CONDITION ON DISCHARGE:  Chest pain, resolved.   HOSPITAL PROCEDURES:  EGD performed April 22, 2006 demonstrated normal PEG in  the body of the stomach.  Gastric emptying study on April 23, 2006  demonstrated mildly delayed gastric emptying.  CT of the chest demonstrated  no definite pulmonary emboli.   HOSPITAL COURSE:  The patient was admitted to the hospitalist service on  April 19, 2006 with history of chest pain and shortness of breath.  D-dimer  was normal.  Troponin-I throughout the hospital course and cardiac enzymes  were normal on April 20, 2006.  CK was elevated from 476 to 540 with a CK-MB  of 10.1 to 12.7.  Etiology of CK-MB unclear but likely related to his  spastic cerebral palsy.   The patient seen by GI, underwent EGD with results as above.   Febrile illness in a patient with fever in the hospital thought initially to  be a urinary source.  Urine culture was negative.  Antibiotics will be  discontinued.   Spastic cerebral palsy  medications adjusted in the hospital, Zyprexa  increased for mood disorder, Depakote started.   GI:  The patient seen by gastroenterology with EGD results above.  PPI  increase to b.i.d.  Kleb test was negative.  Dr. Juanda Chance would like to follow  up the patient in the office.  This can be done after the patient sees Dr.  Laury Axon as an outpatient.   Fluid electrolyte nutrition:  The patient with hypokalemia April 22, 2006  that was replaced April 23, 2006.  Potassium was normal at 3.7.      Bruce Rexene Edison Swords, MD  Electronically Signed     BHS/MEDQ  D:  04/24/2006  T:  04/24/2006  Job:  [id number]   cc:   Loreen Freud, M.D.

## 2011-03-20 NOTE — H&P (Signed)
[name], [name]               ACCOUNT NO.:  [id number]   MEDICAL RECORD NO.:  [id number]          PATIENT TYPE:  INP   LOCATION:  4743                         FACILITY:  [facility]   PHYSICIAN:  [name], M.D.  DATE OF BIRTH:  April 12, 1986   DATE OF ADMISSION:  03/03/2005  DATE OF DISCHARGE:                                HISTORY & PHYSICAL   ADMISSION DIAGNOSIS:  Failure to thrive and dehydration.   The patient is a 25 year old white male with history of cerebral palsy who  presented to our office today with decreased p.o. intake and urinary output  for greater than one week. The patient first presented to the office on  February 23, 2005, with decreased p.o. fluids and sores in the mouth. He had  been seen by Dr. Maurine [name] the day before and was given [employer] Magic Mouthwash  and by the next day the mouth sores seemed a lot better and he was drinking  some. The patient returned on February 26, 2005, with fever and decreased  urinary output, but the mouth was much better and the patient was acting  more like himself. He was given two liters of IV fluids in the office and  one overnight at home through home health, and the patient seemed to be  doing much better with increased urinary output and drinking more p.o.  fluids on his own. His mother called last Friday requesting that a Port-A-  Cath be put in for IV fluids. I was not in the office that day, but the  patient was told to make an office visit to discuss this because the patient  seemed to be doing better. The patient came into the office today, May 2nd,  and she said she was drinking a lot more fluids and he had started eating,  but very little, and he was acting very lethargic and not communicating with  his mother, using the computer at all. Mother complained of decreased urine  output again, only 300 cc from 3 a.m. this morning to just before the office  visit at 10:30 a.m. The patient is being admitted for IV fluids and GI  consult with failure to thrive.   PAST MEDICAL HISTORY:  1.  Cerebral palsy.  2.  Grade 3 kidney reflux.  He sees Dr. Truett [name].  3.  Neurogenic bladder.  4.  Spinal fusion.  5.  Baclofen pump which Dr. Sharene [name] takes care.  6.  Anxiety.  7.  Palpitations.  8.  Hyperthyroidism.   PAST SURGICAL HISTORY:  1.  Strabismus surgery times two.  2.  Tympanostomy tubes.  3.  Tonsillectomy and adenoidectomy.  4.  Hamstring, adductor, and heel cord release in 1989.  5.  Intrathecal Baclofen pump implant in 1999.  6.  Spinal fusion in 2000.  7.  Hamstring release in 2000.  8.  Bilateral great toe IP fusion in 2002.  9.  Baclofen pump and catheter revision in April 2003.   CURRENT MEDICATIONS:  1.  Doxycycline 100 mg a day for acne.  2.  Lexapro 10 mg a day, which was stopped  at his office visit.  3.  Baclofen pump 1200 mcg managed by Dr. Sharene [name].  4.  Retin-A.  5.  Recently On amoxicillin for sore throat and fevers.  6.  Xanax 0.5 mg b.i.d.  7.  Atenolol 25 mg as needed for palpitations per [facility].   SOCIAL HISTORY:  The patient lives at home with mother and father. No other  siblings.   PHYSICAL EXAMINATION:  VITAL SIGNS: Today temperature 98.3, pulse 88, blood  pressure 100/70, respirations 16.  GENERAL: The patient is lethargic and not communicating with [place] at all  today.  HEENT: Head is normocephalic and atraumatic. Eyes reveal pupils equal,  round, and reactive to light. Tympanic membranes are intact bilaterally.  Oropharynx is clear. There are no sores, exudates, or erythema.  HEART: Positive S1 and S2. No murmurs appreciated.  LUNGS: Clear bilaterally. No rales, rhonchi, or wheezes.  ABDOMEN: Soft and nontender. A Baclofen pump is palpated in the right lower  quadrant.  EXTREMITIES: Contractures.  MUSCULOSKELETAL: Increased cervical lordosis with curvature to the right of  the C-spine. Left shoulder hump with minimal curvature of the low spine. The  patient is  able to hold up his  midline when he gets stressed or excited.  NEUROLOGIC: Normally he is very alert, but today very lethargic and  nonverbal. Also not using computer.   LABORATORY DATA:  X-ray was done on May 1st, chest x-ray, which was  negative. On February 27, 2005, CBC was done with white count of 10.2,  hemoglobin 17.1, hematocrit 52.6, platelet count 342,000, MCV 85.2, MCHC  32.4, RDW 13.4.  We are unable to get a urine at that time.   ASSESSMENT/PLAN:  This is a 25 year old white male with history of  cerebral palsy being admitted for dehydration and failure to thrive.  Will  admit for IV fluids and GI referral.      YRL/MEDQ  D:  03/03/2005  T:  03/03/2005  Job:  [id number]

## 2011-03-20 NOTE — Assessment & Plan Note (Signed)
[facility]                                 ON-CALL NOTE   [name], [name]                        MRN:          [id number]  DATE:01/29/2007                            DOB:          May 17, 1986    TIME OF CALL:  11:03 a.m.   PHONE NUMBER:  [phone]   PRIMARY CARE PHYSICIAN:  Dr. Laury Axon.   CALLER:  The patient's mother.   CHIEF COMPLAINT:  Bleeding.  This is a disabled patient who has a G tube  that was put in 2 years ago.  She thinks he might be bleeding  internally.  Last night, the fluid coming back was dark with dark  specks.  This morning, blood came out from around the tube.  They called  the doctor at [facility] and was told that it was probably not surgical, that  they need to get evaluation by a primary care or GI doctor.  [name]  advised him to go to the emergency room for evaluation and she did call  [facility] to let them know he was coming.     Chad A. Tower, MD  Electronically Signed    MAT/MedQ  DD: 01/29/2007  DT: 01/29/2007  Job #: [id number]   cc:   Lelon Perla, DO

## 2011-03-20 NOTE — Letter (Signed)
September 20, 2006    [name]  Aeronautical engineer at  [employer].  [address]  [place], [place]  [address]   RE:  [name], [name]  MRN:  [id number]  /  DOB:  [birth year]   Dear Ms. Apple:   [name] is a 25 year old white male with a history of cerebral palsy,  grade 3 kidney reflux, neurogenic bladder and spinal fusion.  He has a power  wheelchair that is his only mode of transportation secondary to severe  spasticity and weakness in the muscles.  Patient is homebound.  His parents  are his primary caregivers.  Patient recently had several repairs done to  his wheelchair and a copy of the invoice is attached.  Please feel free to  call with any further questions.    Sincerely,      Lelon Perla, DO  Electronically Signed    Shawnie Dapper  DD: 09/20/2006  DT: 09/20/2006  Job #: [id number]

## 2011-03-20 NOTE — H&P (Signed)
[name], EMBLETON               ACCOUNT NO.:  [id number]   MEDICAL RECORD NO.:  [id number]          PATIENT TYPE:  INP   LOCATION:  1823                         FACILITY:  [facility]   PHYSICIAN:  Lelon Perla, M.D.  DATE OF BIRTH:  08-15-1986   DATE OF ADMISSION:  09/16/2004  DATE OF DISCHARGE:                                HISTORY & PHYSICAL   ADMISSION DIAGNOSIS:  Chest pain.   HISTORY OF PRESENT ILLNESS:  The patient is a 25 year old white male with a  history of cerebral palsy who presented to the office today with chest pain  that has been occurring on and off for the last few days.  His mother is  with him in the office and states that she had started him back on his  Atenolol which had been given to him before they moved to [place] a  few years ago.  He was given the medication for palpitations.  The pain has  not been constant but the patient is difficult to get a history from  secondary to cerebral palsy.  He communicates with a computer board but he  did tell his mother today that he wanted to go to the hospital.   The patient's mother was called this morning from day care stating that  Timmy was complaining of chest pain and had a pulse of 120 and blood  pressure of 130/70 at his day care today which is when mom called our office  for the patient to be seen.   PAST MEDICAL HISTORY:  1.  Cerebral palsy.  2.  Hypothyroidism.  3.  Grade 3 kidney reflux.  4.  Depression/anxiety.   PAST SURGICAL HISTORY:  1.  Spinal fusion.  2.  He does have a Baclofen pump which is adjusted by Dr. Deanna Artis.      Hickling.   MEDICATIONS:  1.  Doxycycline 100 mg q.d. for acne.  2.  Baclofen pump.  3.  Zyprexa 5 mg at night.  4.  Benzamycin cream.  5.  Retina-A.  6.  Lexapro 10 mg q.d.  7.  Xanax 0.5 mg p.r.n.  8.  Recently his mother put him back on his Atenolol.   PHYSICAL EXAMINATION:  VITAL SIGNS:  Pulse is 80, blood pressure 100/80,  afebrile.  The patient was  awake, alert, and in no acute distress in his  wheelchair.  He was able to communicate through his mother and through his  computer.  HEART:  Positive S1 and S2.  No murmurs.  Regular rhythm.  LUNGS:  Clear bilaterally.  No rales, rhonchi, or wheezing.  ABDOMEN:  Soft and nontender.  EXTREMITIES:  Contractures.   LABORATORY DATA:  EKG shows ST elevations throughout at a rate of 90.   ASSESSMENT/PLAN:  1.  Chest pain, possible pericarditis:  Did discuss with Dr. Cecil Cranker and thought it best for him to be admitted for observation.  We      will get a CBC, sedimentation rate, ANA, repeat thyroid, CPK x 3, and      order  2-D echocardiogram.  The patient is to be evaluated by cardiology.  2.  Cerebral palsy:  The patient is on a Baclofen pump that has recently      been adjusted but Dr. Deanna Artis. Hickling can be consulted if necessary.  3.  History of depression and anxiety:  We will keep him on his Lexapro and      Xanax 0.5 mg three times a day as needed.       YRL/MEDQ  D:  09/16/2004  T:  09/16/2004  Job:  [id number]

## 2011-03-20 NOTE — Assessment & Plan Note (Signed)
 HEALTHCARE                         GASTROENTEROLOGY OFFICE NOTE   MAKARIOS, MADLOCK                        MRN:          [id number]  DATE:02/16/2007                            DOB:          [birth year]    [name] is a 26 year old young man with cerebral palsy with percutaneous  gastrostomy since August 2006.  We saw him 1 year ago for  gastroesophageal reflux disease while he was hospitalized at [facility].  At that time, upper endoscopy showed reflux esophagitis.  He  has done well until several weeks ago when he developed coffee ground  material per PEG.  Since then, we put him on a Carafate slurry per PEG,  and doubled his AcipHex to 20 mg twice a day.  The bleeding has stopped.  His hemoglobin in the emergency room was over 12 gm.  He has not had  recurrent bleeding since then.   MEDICATIONS:  1. AcipHex 20 mg p.o. b.i.d.  2. Paxil 10 mg p.o. q.a.m.  3. Depakote 250 mg b.i.d.  4. Zyprexa 2.5 mg nightly.  5. Imdur 10 mg nightly.  6. Baclofen pump 650 mcg per day.   PHYSICAL EXAMINATION:  Blood pressure 114/70.  Pulse 88.  Weight 110  pounds.  He was alert and oriented in a wheelchair.  Exam  in the wheelchair was  limited  ABDOMEN:  Showed percutaneous gastrostomy in left upper quadrant with  some protrusion of the gastric and abdominal wall mucosa around the  gastrostomy site.  There was some purulent material, or perhaps tube  feedings around the gastrostomy in small amounts, but the skin itself  appeared clean.  Tube was a 22 [place] gastrostomy, which was moving  easily back and forth.  Tubing was somewhat worn out, and the adaptors  were also leaky and worn out .  Abdomen was otherwise soft with  normoactive bowel sounds.   IMPRESSION:  58. A 25 year old white male dependent on gastrostomy tube feedings      because of cerebral palsy related neurogenic dysphagia and high      risk for aspiration  2. Chronic gastroesophageal reflux.  3. Status post gastrointestinal bleed, likely secondary to irritation      of gastric wall by a T-bumper  of the gastrostomy tube.   PLAN:  1. Change the PEG tube.  2. Patient will need a larger tube because of the leakage of the tube      feeding around.  We will plan to change from 22 to 24 [place], would      prefer replacement PEG with the inflatable baloon to avoid havind      to endoscope him.  3. Continue AcipHex 20 mg p.o. b.i.d.  4. Carafate slurry prescription given to be used p.r.n. coffee ground      blood per gastrostomy.     Hedwig Morton. Juanda Chance, MD  Electronically Signed    DMB/MedQ  DD: 02/16/2007  DT: 02/16/2007  Job #: [id number]   cc:   Lelon Perla, DO

## 2011-03-20 NOTE — Discharge Summary (Signed)
NAMEZAKAR, BROSCH               ACCOUNT NO.:  [id number]   MEDICAL RECORD NO.:  [id number]          PATIENT TYPE:  INP   LOCATION:  4705                         FACILITY:  [facility]   PHYSICIAN:  Rene Paci, M.D. LHCDATE OF BIRTH:  September 02, 1986   DATE OF ADMISSION:  09/16/2004  DATE OF DISCHARGE:  09/17/2004                                 DISCHARGE SUMMARY   DISCHARGE DIAGNOSES:  1.  Atypical chest pain.  2.  Rule out gastroesophageal reflux disease.   HISTORY OF PRESENT ILLNESS:  [name] is a 25 year old white male with  cerebral palsy.  He presented to the office today with complaints of chest  pain.  His chest pain has been intermittent for the last few days.   PAST MEDICAL HISTORY:  1.  Cerebral palsy.  2.  Borderline hyperthyroidism, not currently on any medications.  3.  Grade III kidney reflux.  4.  Spinal fusion.  5.  Baclofen pump.  6.  Depression and anxiety.   HOSPITAL COURSE:  PROBLEM #1 -  CHEST PAIN:  The patient was admitted to  rule out myocardial infarction.  Serial cardiac enzymes were negative.  EKG  on admission revealed nonspecific ST elevation in leads II, III, aVF, V5 and  V6 but no signs of acute ischemia.  Dr. Maryagnes Amos wanted to rule out  pericarditis.  Sed rate was obtained and was zero.  Currently a 2-D echo was  pending but likely not pericarditis with a normal sed rate.  Echo is  pending, however, if it is negative for pericarditis, the patient will be  discharged home.  Other etiology includes possible gastroesophageal reflux  disease as he does describe kind of a constant epigastric pain that radiates  up into the esophagus.  The patient also has more pain when lying flat.  Reportedly, the patient was on a proton pump inhibitor in the past and was  discontinued because the family did not feel it was very effective.  Given  his body habitus, he certainly is at risk for reflux disease.  The patient  and mother are agreeable to another trial of  the proton pump inhibitor.   PROBLEM #2 -  CARDIOVASCULAR:  The patient has a history of heart  palpitations and had previously been on atenolol.  Dr. Eden Emms did not  recommend resuming the patient's atenolol at this time.  He recommended  pursuing outpatient event versus Holter monitor as the patient had recurrent  heart palpitations.   PROBLEM #3 -  ENDOCRINE:  The patient has a history of borderline  hyperthyroidism.  A free T3 and free T4 were ordered, however, a T3 and  total T4 were obtained.  These were normal and we have reordered his TFTs.   LABORATORY DATA AT DISCHARGE:  CMET was normal except for a glucose of 129.  CBC with differential was normal.  Sed rate was 0.  T3 was 158.4, total T4  7.8.  ANA is pending.   Chest x-ray was negative.   A 2-D echo is still pending.   DISCHARGE MEDICATIONS:  1.  Doxycycline 100 mg daily  for acne.  2.  Baclofen pump.  3.  Zyprexa 5 mg q.h.s.  4.  Lexapro 10 mg daily.  5.  Xanax p.r.n.  6.  He has been instructed not to take his atenolol.  7.  Protonix 40 mg daily for 30-day trial.   FOLLOW UP:  Follow up with Dr. Maryagnes Amos next week.      [name]   LC/MEDQ  D:  09/17/2004  T:  09/17/2004  Job:  [id number]

## 2011-03-20 NOTE — Consult Note (Signed)
NAMEHERSEY, MACLELLAN               ACCOUNT NO.:  [id number]   MEDICAL RECORD NO.:  [id number]          PATIENT TYPE:  INP   LOCATION:  4743                         FACILITY:  [facility]   PHYSICIAN:  Iva Boop, M.D. LHCDATE OF BIRTH:  1985/11/09   DATE OF CONSULTATION:  03/04/2005  DATE OF DISCHARGE:                                   CONSULTATION   REASON FOR CONSULTATION:  Feeding problems, weight loss, dehydration.   HISTORY:  [name] is a 25 year old white male with cerebral palsy.  He  has had three episodes since November where he has been dehydrated and  required IV fluids.  His mom says he has lost about 12 pounds.  He has had  some trouble with a febrile illness with mouth sores in late April.  He was  given Magic mouthwash, was improved, but then had recurrent problems and was  not eating or drinking well.  Over the past few days, he has actually  started to drink and eat a little bit, but his urine output had been  decreased only 300 mL from 3 a.m. to 10:30 a.m., so he was admitted for IV  fluids and failure to thrive issues.  [name] apparently ate quite a bit today,  she says.  She has noted that he has had some problems sitting with his neck  down in a flexed position and she is concerned about contractures there.  She says that has progressed since he had spinal fusion a couple of years  ago.  She had considered a Port-A-Cath, as well, as a possible option.  They  do have Home Health Care.   PAST MEDICAL HISTORY:  1.  Cerebral palsy.  2.  Grade 3 reflux.  3.  Neurogenic bladder.  4.  Spinal fusion.  5.  Baclofen pump, Dr. Sharene Skeans cares for this.  6.  Anxiety.  7.  Palpitations.  8.  Hyperthyroidism.   PAST SURGICAL HISTORY:  1.  Strabismus surgery x 2.  2.  Tympanostomy tubes.  3.  Tonsillectomy and adenoidectomy.  4.  Hamstring, adductor, and heel cord release in 1989.  5.  Intrathecal Baclofen pump implant in 1999.  6.  Spinal fusion in 2000.  7.   Hamstring release in 2000.  8.  Bilateral great toe IP fusion 2003.  9.  Baclofen pump and catheter revision April 2003.   MEDICATIONS AT HOME:  Doxycycline 100 mg daily for acne, Lexapro 10 mg  daily, and Retin-A.   MEDICATIONS IN THE HOSPITAL:  Baclofen pump.   ALLERGIES:  Sulfa.   SOCIAL HISTORY:  He lives at home with his parents, he is to graduate from  the [employer] on June 1.  No tobacco, alcohol, or drugs.   FAMILY HISTORY:  Asthma in his mother, breast cancer in grandmother and  aunt, prostate cancer in his grandfather and great uncles.   REVIEW OF SYMPTOMS:  Mental status is improved over the last 24 hours since  IV fluids.  He had been lethargic, not talking, not eating well, though  again that was starting to improve.  Urine  output was off.  He has lost 12  pounds.  No other obvious problems at this time, though it is difficult to  get a complete review of systems given his status.  He does have wakefulness  at night, he has a chronic indwelling Foley catheter.   PHYSICAL EXAMINATION:  GENERAL:  Chronically ill young white male who is awake.  VITAL SIGNS:  Temperature 97.9, pulse 85, respirations 20, blood pressure  117/66.  HEENT:  Eyes are anicteric.  Mouth is free of obvious lesions, it was open  most of the time.  NECK:  Contracted forward slightly into the chest while he lies in the bed.  LUNGS:  Clear anteriorly, poor effort.  HEART:  S1 and S2, no murmurs, gallops, and rubs.  ABDOMEN:  Soft, Baclofen pump is in the right lower side, there were no  scars, the left upper quadrant is soft, nontender, no masses.  EXTREMITIES:  No edema.  MUSCULOSKELETAL/NEUROLOGICAL:  Multiple contractures of the upper and lower  extremities, generalized muscle wasting.   LABORATORY DATA:  Hemoglobin 15, hematocrit 45, white count 9.7.  BMP  normal.  LFTs normal.  TSH 0.345.   ASSESSMENT:  Feeding problems and weight loss with recurrent dehydration  issues.  This in the  setting of cerebral palsy.   RECOMMENDATIONS AND PLAN:  Percutaneous endoscopic gastrostomy is a  reasonable option to prevent hospitalizations in this patient.  I have  explained the risks, benefits, indications that include bleeding, infection,  medication reaction, possibility of surgery for complications.  Eventually,  it could be changed to a button type which is a lower profile tube.   His mom is going to think about this some more.  She seems inclined to do  it, but wants to talk to her husband some more.  Further plans pending that.  We might be able to do this in the next couple of days.  They have Home  Health care to help them with this.   I appreciate the opportunity to care for this patient.   ADDENDUM 03/08/05   Attempted PEG unsuccessful. He developed tachycardia and muscular  contraction of body that seemed voluntary. Once I told him that we were not  going to place the PEG he relaxed. If he and family decide to pursue PEG in  future, I would ask for general anesthesia and have a surgeon do it (in case  endoscopic route failed).      CEG/MEDQ  D:  03/04/2005  T:  03/04/2005  Job:  [id number]   cc:   Loreen Freud, M.D.

## 2011-03-20 NOTE — H&P (Signed)
[name], [name]               ACCOUNT NO.:  [id number]   MEDICAL RECORD NO.:  [id number]          PATIENT TYPE:  INP   LOCATION:  3742                         FACILITY:  [facility]   PHYSICIAN:  Lelon Perla, M.D.  DATE OF BIRTH:  15-Feb-1986   DATE OF ADMISSION:  04/19/2006  DATE OF DISCHARGE:                                HISTORY & PHYSICAL   ADMITTING DIAGNOSES:  1.  Chest pain.  2.  Shortness of breath.   The patient is a 25 year old white male with a history of cerebral palsy,  who presented to the office with his mother complaining of shortness of  breath and chest pain since Thursday.  This occurred last month as well and  lasted 1 week.  It is difficult to get a good history from the patient  secondary to his condition.  He communicates with a computer.  Primary  caregivers are his parents, and they have home health from [place] during  the day.  The patient has been very agitated at night and not sleeping, so  both parents have been up with the patient several nights over the past few  weeks.  The mother is also requesting a social work consult for possible  placement in a group home or nursing home.   PAST MEDICAL HISTORY:  1.  Cerebral palsy, grade 3.  2.  Kidney reflux.  He sees Dr. Etta Grandchild.  3.  He has neurogenic bladder.  4.  Spinal fusion.  5.  Baclofen pump which he is followed by Dr. Sharene Skeans.  6.  Anxiety, palpitations, SCG.  He has been at [facility].  7.  Hypothyroidism.   MEDICATIONS:  1.  Doxycycline 100 mg daily.  2.  Baclofen pump.  3.  Retin-A.  4.  Paxil 20 mg.  5.  Zyprexa 5 mg.  6.  Inderal 10 mg twice daily.  7.  Aciphex.  8.  Valium 5 mg.   SOCIAL HISTORY:  The patient lives at home with mom and dad and has home  health services during the day.   PHYSICAL EXAMINATION:  VITAL SIGNS:  Pulse is 124 in the office.  Blood  pressure is 108/72.  Respirations 20.  Pulse oximetry 96% on room air.  The  patient is very agitated in the  exam room.  It took awhile to calm him down  in order to do any kind of exam.  HEENT:  Head normocephalic, atraumatic.  Eyes, pupils __________.  HEART:  Positive S1, S2.  No murmurs.  LUNGS:  Clear bilaterally.  No rales, rhonchi, or wheezing.  ABDOMEN:  Soft.  He does have a G tube and a Baclofen pump in the right  lower quadrant.  EXTREMITIES:  Multiple contractures.  MUSCULOSKELETAL:  He has cervical lordosis with curvature to the right in C-  spine, left shoulder hump.  NEUROLOGIC:  He is awake, alert, and oriented, is nonverbal, communicates  with a computer.   EKG with sinus tachycardia, and there is also a lot of artifact.   ASSESSMENT/PLAN:  1.  Chest pain, shortness of breath, rule out  pulmonary embolism.  We will      get a chest CT and labs and admit to telemetry.  See admit orders.  2.  Cerebral palsy.  We will get a social work consult for possible nursing      home or group home placement.  3.  Anxiety.  We will continue diazepam, Zyprexa, and Paxil.  4.  History of __________and chest pain in the past, seen by [facility]      Cardiology, and they put him on Inderal 10 mg twice daily.  The patient      has not been taking this regularly, however.      Lelon Perla, M.D.     Shawnie Dapper  D:  04/19/2006  T:  04/19/2006  Job:  [id number]

## 2011-03-20 NOTE — Op Note (Signed)
[name], STGERMAINE               ACCOUNT NO.:  [id number]   MEDICAL RECORD NO.:  [id number]          PATIENT TYPE:  INP   LOCATION:  4743                         FACILITY:  [facility]   PHYSICIAN:  Deanna Artis. Hickling, M.D.DATE OF BIRTH:  05/19/1986   DATE OF PROCEDURE:  03/05/2005  DATE OF DISCHARGE:                                 OPERATIVE REPORT   PROCEDURE:  Emptying, refilling, and reprogramming intrathecal Baclofen  pump.   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   DESCRIPTION OF PROCEDURE:  The patient was sterilely prepped and draped.  A  21 gauge Huber needle was inserted into the central port and drained it of  5.2 mL of Baclofen.  The pump was placed under partial vacuum.  A 20 mL  syringe of Baclofen concentration 200 mcg per mL was instilled through a  Millipore filter back into the reservoir filling it completely.  The  reservoir was reprogrammed to 18 mL.  The complex continuous infusion was  unchanged.  The patient received 50 mcg over a 15 minute period every six  hours.  For 5 hours 45 minutes following that, the patient received 37.5 mcg  per hour.  Total dose per day is 1050 mcg.  Refill interval is 30 days or  April 04, 2005.  The patient tolerated the procedure well.      [facility]/MEDQ  D:  03/05/2005  T:  03/05/2005  Job:  [id number]

## 2011-03-20 NOTE — H&P (Signed)
[name], [name]                           ACCOUNT NO.:  [id number]   MEDICAL RECORD NO.:  [id number]                   PATIENT TYPE:  OBV   LOCATION:  0446                                 FACILITY:  [facility]   PHYSICIAN:  Lelon Perla, M.D.               DATE OF BIRTH:  [birth year]   DATE OF ADMISSION:  06/24/2004  DATE OF DISCHARGE:                                HISTORY & PHYSICAL   ADMISSION DIAGNOSES:  1. Abdominal pain.  2. Dehydration.   HISTORY OF PRESENT ILLNESS:  The patient is a 25 year old white male with a  history of cerebral palsy, who presented to the office on June 23, 2004,  with the mother complaining of strong smelling urine, nausea, and abdominal  pain.  No vomiting or diarrhea at that time.  No constipation.  The patient  was drinking yesterday and eating, although his appetite and p.o. intake was  decreased.  He did have urine output.  Yesterday afternoon the patient was  unable to hold down medicine, but after a Phenergan suppository was able to  hold down Cipro and his other medicines as well with no problems.  This  morning the mother called stating the patient was worse.  He was not  drinking or eating anything and his urine output was very much decreased  from yesterday.   PAST MEDICAL HISTORY:  1. Cerebral palsy.  2. Grade III kidney reflux.  3. Trabeculated bladder (neurogenic bladder).  4. Depression/anxiety.   MEDICATIONS:  1. He is on a baclofen pump, which is moderated by Dr. Sharene Skeans.  2. Doxycycline for acne.  3. Zyprexa 2.5 mg at night.  4. Lexapro 10 mg a day.  5. Retin-A.  6. Benzamycin cream.  7. Cipro suspension that was started on June 23, 2004.   SOCIAL HISTORY:  The patient lives at home with his parents.   PAST SURGICAL HISTORY:  1. Spinal fusion in 2000.  2. Baclofen pump was placed in 1999.   PHYSICAL EXAMINATION:  VITAL SIGNS:  On June 23, 2004 temperature 97.1,  pulse 88, respirations 16, blood pressure  100/78.  GENERAL:  He was alert and active.  Really no different mental status-wise  than previously.  The patient is in a wheelchair and has a lot of  contractures.  HEENT:  The patient __________ mucous membranes are moist.  ABDOMEN:  Soft and nontender.   On June 24, 2004 this afternoon the patient returned quickly still  afebrile but much weaker.  Mucous membranes are dry.  He did have some  generalized abdominal pain.  No rebound but is was difficult to assess  secondary to the patient's condition.   LABORATORY DATA:  Urine showed largest ketones and trace protein.  Labs  drawn on June 23, 2004 a CBC and a complete metabolic panel were done.  White count was 8.6, hemoglobin 17.1, hematocrit 50.9, platelets 320.  Sodium 137,  chloride 102, CO2 of 26, BUN 13, creatinine 0.8.  Total  bilirubin was 1.5.  The rest of the C-MET was negative.   ASSESSMENT/PLAN:  1. Abdominal pain and dehydration.  The patient is being admitted for 24     hour observation for IV fluids.  Phenergan.  We will recheck labs and an     acute abdomen series.  2. History of cerebral palsy.  We will continue the baclofen pump.  3. Depression/anxiety.  Continue Lexapro and Zyprexa.  Also prescribe some     Xanax p.r.n.                                               Lelon Perla, M.D.    Shawnie Dapper  D:  06/24/2004  T:  06/24/2004  Job:  [id number]

## 2011-03-20 NOTE — Discharge Summary (Signed)
[name], [name]                           ACCOUNT NO.:  [id number]   MEDICAL RECORD NO.:  [id number]                   PATIENT TYPE:  INP   LOCATION:  0446                                 FACILITY:  [facility]   PHYSICIAN:  Titus Dubin. Alwyn Ren, M.D. [facility]         DATE OF BIRTH:  12-09-1985   DATE OF ADMISSION:  06/24/2004  DATE OF DISCHARGE:  06/27/2004                                 DISCHARGE SUMMARY   ADMITTING DIAGNOSES:  1. Abdominal pain and dehydration.  2. Cerebral palsy.  3. Depression and anxiety.   DISCHARGE DIAGNOSES:  1. Abdominal pain, resolved.  2. Anorexia, improved.   BRIEF HISTORY:  Mr. [name] is a 25 year old white male with history of  cerebral palsy who presented to the office June 23, 2004 with complaints  of strong-smelling urine and nausea and abdominal pain.  He had decreased  appetite and p.o. intake the day prior to the office visit and had an  inability to take his p.o. medicines due to nausea.  The urine output  decreased in the 12-24 hours prior to the office visit.   The patient was admitted to observation and treated with IV fluids.  Phenergan was administered on a p.r.n. basis.   His abdominal ultrasound was unremarkable.  Abdominal series revealed no  obstruction or free air.  Spinal rods were noted from previous surgery.   His admission white count was 7500, hematocrit 47.7.  Differential was  normal.  Despite his clinical findings suggesting dehydration, BUN was 13  and creatinine was 0.8.  Glucose was 87.  Following IV fluids, BUN dropped  to 1 and glucose was 140.  Urine showed small bilirubin and ketones.  Liver  functions were normal.  Urine culture revealed no growth.   The patient showed gradual improvement and on the day of discharge he was  taking a full clear liquid diet and he was anxious to advance to a toothless  soft-type diet.  His mother stated that he was still having some abdominal  pain.  Because of abdominal pain a CT scan  was performed.  This revealed  questionable fluid and swelling in the abdominal wall musculature around the  Baclofen and implantable pump.  There was no redness and he had no fever  during the hospitalization.  As noted, white count was normal.  CT of the  pelvis suggested also proctitis with a thick wall inflamed rectum.  He does  have enemas at least every third day.  Sometimes these are repeated and  according to this mother.   She was concerned about lethargy and at the time of discharge his Lexapro  was decreased from 10 mg daily to 5 mg daily.  His other home medications  were left unchanged.  He was to advance the diet as tolerated.   She did mention that he had been seen by Dr. Everardo All for possible thyroid  disorder but Dr. Everardo All did not  feel that medication was warranted.  The  thyroid function can be monitored by Dr. Laury Axon as an outpatient.   DISCHARGE STATUS:  Improved.   DIET:  Toothless soft as tolerated.   PROGNOSIS:  Depends on the multi systems problems he has, specifically his  cerebral palsy.  If the abdominal pain recurs then GI consultation will be  pursued, with possible flexible sigmoidoscopy in view of the CT findings.                                               Titus Dubin. Alwyn Ren, M.D. [facility]    [facility]/MED[facility]  D:  06/26/2004  T:  06/27/2004  Job:  [id number]   cc:   Loreen Freud, M.D.   Sean A. Everardo All, M.D. [facility]

## 2011-03-20 NOTE — Consult Note (Signed)
[name], [name]               ACCOUNT NO.:  [id number]   MEDICAL RECORD NO.:  [id number]          PATIENT TYPE:  INP   LOCATION:  1823                         FACILITY:  [facility]   PHYSICIAN:  Olga Millers, M.D. LHCDATE OF BIRTH:  November 09, 1985   DATE OF CONSULTATION:  DATE OF DISCHARGE:                                   CONSULTATION   PHYSICIANS:  1.  Loreen Freud, M.D., primary care physician.  2.  Deanna Artis. Sharene Skeans, M.D., neurologist.   CHIEF COMPLAINT:  Chest pain and palpitations.   HISTORY OF PRESENT ILLNESS:  [name] is a very pleasant 25 year old white  male with a history of cerebral palsy and no known history of coronary  artery disease.  He presented to his primary care physician's office today  with palpitations and chest discomfort.  He had EKG changes noted and was  referred to the emergency room for further evaluation and treatment.  The  patient has had palpitations off and on for the last four years.  He  apparently wore an event monitor in the past.  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  No therapy has been indicated yet.   He has continued to have palpitations and the patient has been started back  on Atenolol 25 mg twice a day.  He has a lot of muscle spasticity and  agitation.  His mother has noted that he becomes more agitated and has  increasing heart rate.  This is usually the time when he complains of chest  pain.  He also notes increasing chest pain with deep breathing and eating.  He denies any syncope, fevers, or symptoms consistent with viral infections.   PAST MEDICAL HISTORY:  1.  Significant for borderline hyperthyroidism.  No therapies indicated to      date.  2.  He also has a history of palpitations and has been evaluated in the past      with a 30-day event monitor.  3.  He has grade 3 kidney reflux.  4.  He is status post spinal fusion.   He has had Baclofen pump implanted and      revision x 1.  5.  He has a history of cerebral palsy.  6.  He is status post hamstring release x 2.  7.  Status post toe surgery x 2.   ALLERGIES:  SULFA.   MEDICATIONS:  1.  Lexapro 10 mg q.d.  2.  Zyprexa 5 mg q.h.s.  3.  Atenolol 25 mg b.i.d.  4.  Klonopin p.r.n.   SOCIAL HISTORY:  The patient is wheelchair-bound.  He attends [employer].  There is no history of tobacco or alcohol abuse.   FAMILY HISTORY:  Insignificant for premature coronary artery disease.   REVIEW OF SYMPTOMS:  Please see HPI.  Denies any melena, hematochezia,  hematuria, dysuria, cough.  The rest of the review of systems are negative.   PHYSICAL EXAMINATION:  VITAL SIGNS:  Blood pressure 117/79, temperature  98.6, pulse 81, respirations  14.  Oxygen saturation 97% on room air.  GENERAL:  He is a young male with upper and lower extremity spasticity,  wheelchair bound.  HEENT:  Unremarkable.  NECK:  Without JVD or bruits.  CARDIOVASCULAR:  Normal S1 and S2.  Regular rate and rhythm without murmurs  or rubs.  LUNGS:  Clear to auscultation bilaterally.  ABDOMEN:  Soft and nontender.  CHEST:  Pectus excavatum.  EXTREMITIES:  Without edema.  VASCULAR:  There are 2+ dorsalis pedis and posterior tibialis pulses  bilaterally.  ABDOMEN:  Soft and nontender.  There is an implantable device noted beneath  the skin consistent with Baclofen pump.   LABORATORY DATA:  Electrocardiogram reveals sinus rhythm with a heart rate  of 90 and diffuse ST elevation consistent with pericarditis.  Labs are  pending.   IMPRESSION:  1.  Chest pain with electrocardiogram changes:  Probable pericarditis.  2.  Cerebral palsy.  3.  Borderline hypothyroidism.  4.  Grade 3 kidney reflux.  5.  Status post multiple surgeries.  6.  Status post Baclofen pump.   PLAN:  The patient was also seen and examined by Dr. Olga Millers.  The  patient has a history of intermittent increased  heart rate for the last four  to five years and he notes chest pain with these episodes.  He had a  recurrent episode today at [facility] and was seen by Dr. Loreen Freud,  his primary care physician.  As noted above, he has had EKG changes.  His  symptoms and the findings on electrocardiogram seem consistent with  pericarditis.  We recommend treating with NSAIDs and we will go ahead and  start Motrin 400 mg three times a day.  We will also get an echocardiogram  and check TSH, free T3 to free T4.  The patient may need treatment for  hyperthyroidism.  He has been placed on Atenolol which sometimes help his  palpitations and we would recommend  continuing that.  If his palpitations persist, we would recommend an  outpatient event monitor, although we doubt ischemia as a cause of his  symptoms. We will go ahead and check serial cardiac enzymes.  We will be  happy to follow along with this patient throughout his stay.       SW/MEDQ  D:  09/16/2004  T:  09/16/2004  Job:  [id number]   cc:   Loreen Freud, M.D.   Deanna Artis. Sharene Skeans, M.D.  [address]  [address]  [place]  [place] [address]  Fax: [phone]

## 2011-03-25 ENCOUNTER — Ambulatory Visit: Payer: Medicare Other | Admitting: Physical Therapy

## 2011-03-31 ENCOUNTER — Other Ambulatory Visit: Payer: Self-pay | Admitting: Family Medicine

## 2011-03-31 MED ORDER — DOXYCYCLINE HYCLATE 100 MG PO TABS: 100.0000 mg | ORAL_TABLET | Freq: Two times a day (BID) | ORAL | Status: AC

## 2011-03-31 NOTE — Telephone Encounter (Signed)
Last seen 01/20/11 and filled 02/27/11 patient uses meds for Acne--- please advise    KP

## 2011-04-01 ENCOUNTER — Ambulatory Visit: Payer: Medicare Other | Admitting: Physical Therapy

## 2011-04-06 ENCOUNTER — Ambulatory Visit: Payer: Medicare Other | Attending: Orthopedic Surgery | Admitting: Physical Therapy

## 2011-04-07 ENCOUNTER — Ambulatory Visit (INDEPENDENT_AMBULATORY_CARE_PROVIDER_SITE_OTHER): Payer: Medicare Other | Admitting: Family Medicine

## 2011-04-07 ENCOUNTER — Encounter: Payer: Self-pay | Admitting: Family Medicine

## 2011-04-07 DIAGNOSIS — Z0000 Encounter for general adult medical examination without abnormal findings: Secondary | ICD-10-CM

## 2011-04-07 DIAGNOSIS — R1310 Dysphagia, unspecified: Secondary | ICD-10-CM

## 2011-04-07 DIAGNOSIS — G809 Cerebral palsy, unspecified: Secondary | ICD-10-CM

## 2011-04-07 DIAGNOSIS — Z23 Encounter for immunization: Secondary | ICD-10-CM

## 2011-04-07 NOTE — Assessment & Plan Note (Signed)
Papers filled out for day program Pt will get PPD and [employer] #1 today

## 2011-04-07 NOTE — Progress Notes (Signed)
  Subjective:    Patient ID: [name], male    DOB: Jun 16, 1986, 25 y.o.   MRN: [id number]  HPI Pt here with mom c/o difficulty swallowing still but they have found as long as he sits all the way up and food is pureed he does ok.  Pt will be going to a day program again and paper work needs to be filled out for [employer].     Review of Systems As above    Objective:   Physical Exam  Constitutional: He appears well-developed and well-nourished. No distress.  HENT:  Right Ear: External ear normal.  Left Ear: External ear normal.  Nose: Nose normal.  Mouth/Throat: Oropharynx is clear and moist.  Eyes: EOM are normal. Pupils are equal, round, and reactive to light. Right eye exhibits no discharge. Left eye exhibits no discharge. No scleral icterus.  Cardiovascular: Normal rate, regular rhythm and normal heart sounds.   No murmur heard. Pulmonary/Chest: Effort normal and breath sounds normal. No respiratory distress. He has no wheezes. He has no rales. He exhibits no tenderness.  Abdominal: Soft.  Musculoskeletal:       + contractures---no change from previous Pt is in wheelchair  Neurological: He is alert.       Speaks with dynavox  Skin: Skin is warm and dry. No rash noted.  Psychiatric: He has a normal mood and affect. His behavior is normal.          Assessment & Plan:

## 2011-04-07 NOTE — Assessment & Plan Note (Signed)
con't to eat only softened foods Aspiration precautions

## 2011-04-09 ENCOUNTER — Ambulatory Visit: Payer: Medicare Other | Admitting: Physical Therapy

## 2011-04-09 LAB — TB SKIN TEST
Induration: 0
TB Skin Test: NEGATIVE mm

## 2011-04-13 ENCOUNTER — Ambulatory Visit: Payer: Medicare Other | Admitting: Physical Therapy

## 2011-04-14 ENCOUNTER — Telehealth: Payer: Self-pay | Admitting: *Deleted

## 2011-04-14 DIAGNOSIS — G809 Cerebral palsy, unspecified: Secondary | ICD-10-CM

## 2011-04-14 NOTE — Telephone Encounter (Signed)
Pt mom is requesting Rx for speech therapy to be faxed to [phone] @ [facility].

## 2011-04-14 NOTE — Telephone Encounter (Signed)
Ok to send order. 

## 2011-04-14 NOTE — Telephone Encounter (Signed)
Order put in.       KP 

## 2011-04-16 ENCOUNTER — Ambulatory Visit: Payer: Medicare Other | Admitting: Physical Therapy

## 2011-04-18 IMAGING — CR DG CHEST 2V
2 series · 2 of 2 positions shown · non-contrast
Comparison: 08/05/2010

CLINICAL DATA: Cough and shortness of breath.  507.0.  V 72.82.

CHEST - 2 VIEW

[view not recorded (1 of 2)]
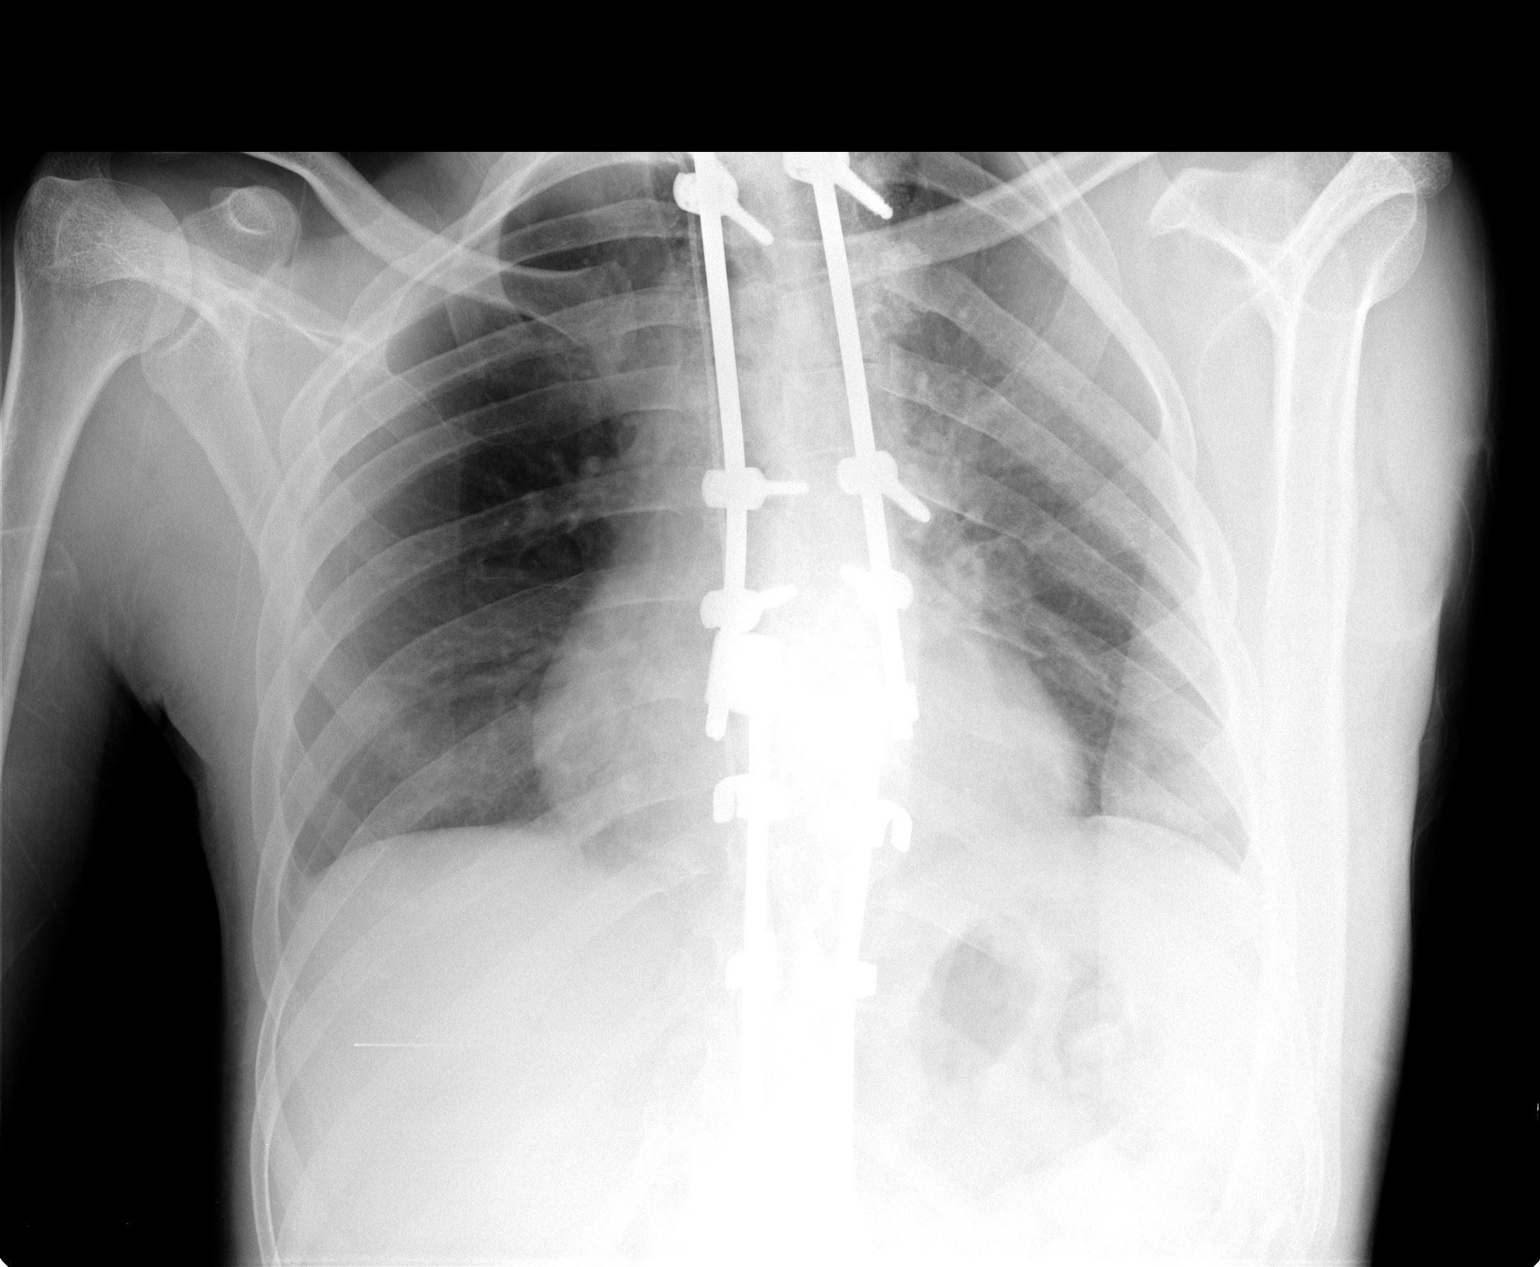

[view not recorded (2 of 2)]
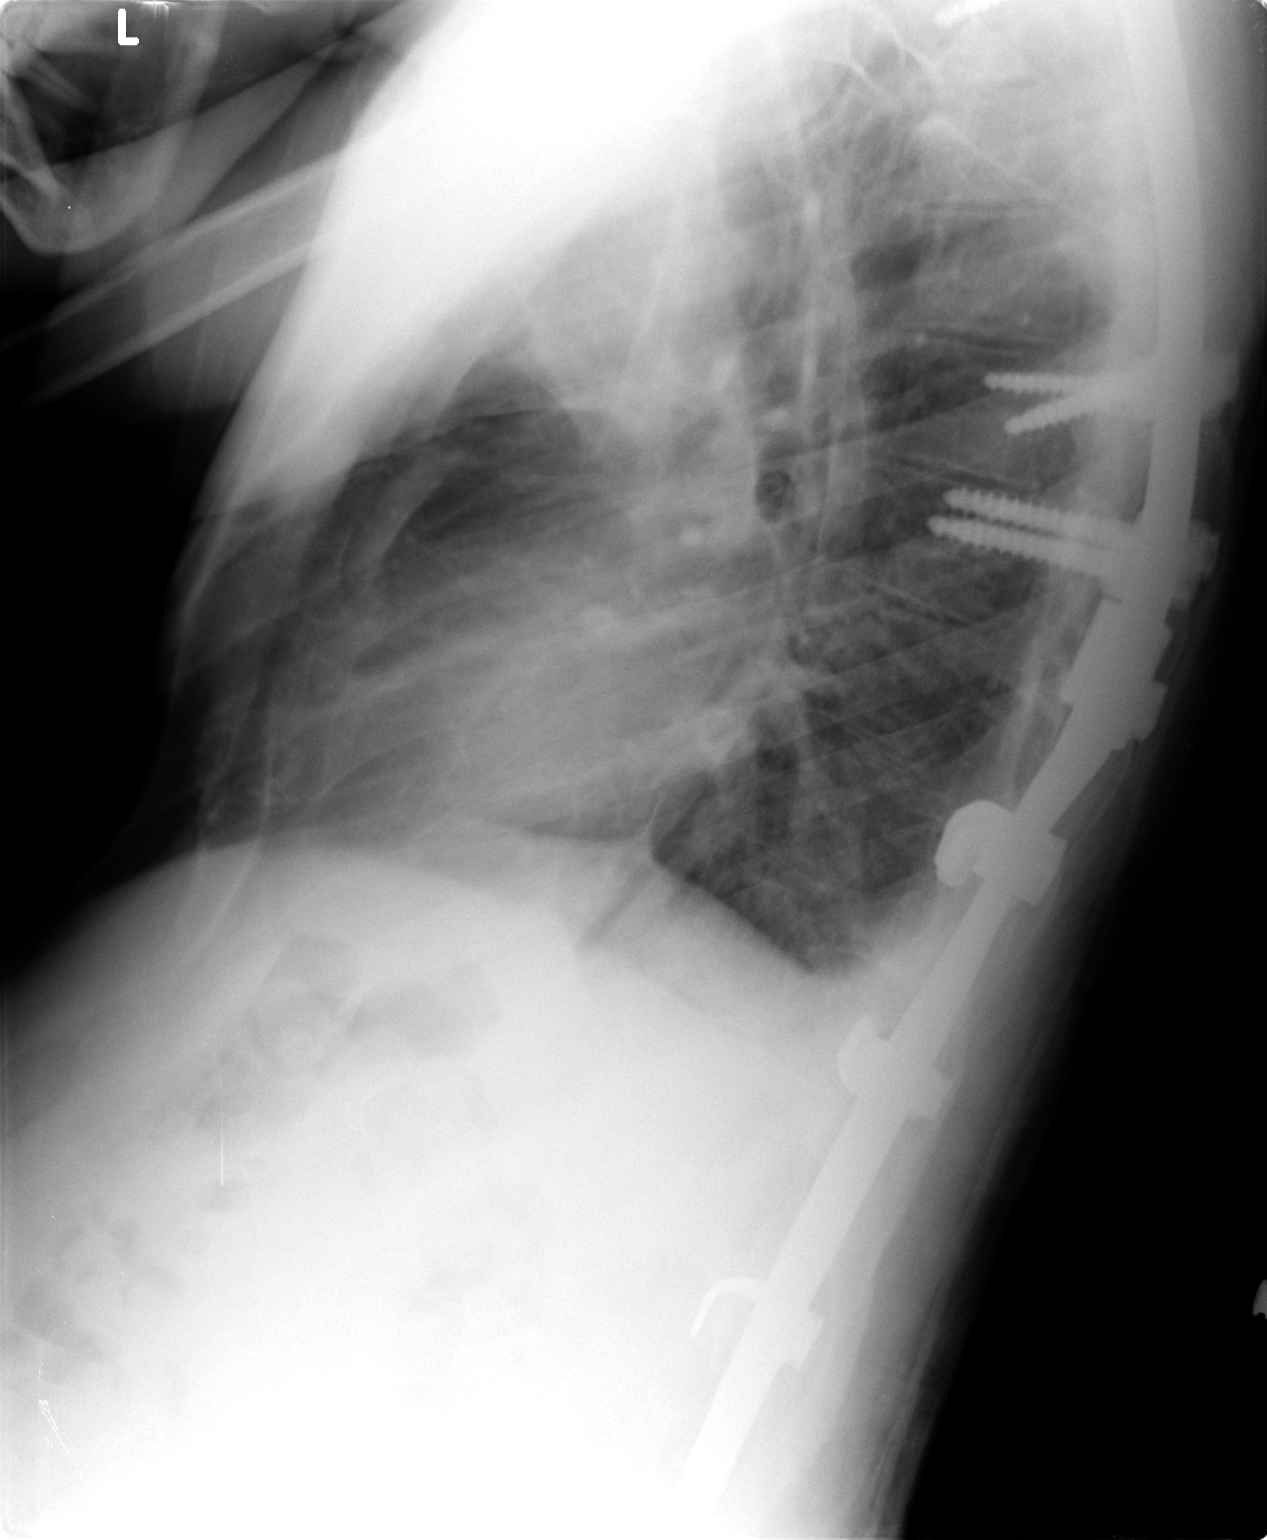

[2 of 2 positions shown; findings below may reference images not displayed]

FINDINGS: There are tiny bilateral pleural effusions.  There is no
discrete infiltrate.  Heart size and vascularity are normal.
Harrington rods noted in the thoracic and lumbar spine.
IMPRESSION: Small effusions.  No discrete infiltrates.

## 2011-04-20 ENCOUNTER — Ambulatory Visit: Payer: Medicare Other | Admitting: Physical Therapy

## 2011-04-21 ENCOUNTER — Other Ambulatory Visit (HOSPITAL_BASED_OUTPATIENT_CLINIC_OR_DEPARTMENT_OTHER): Payer: Self-pay | Admitting: Orthopedic Surgery

## 2011-04-21 ENCOUNTER — Ambulatory Visit (HOSPITAL_BASED_OUTPATIENT_CLINIC_OR_DEPARTMENT_OTHER)
Admission: RE | Admit: 2011-04-21 | Discharge: 2011-04-21 | Disposition: A | Discharge status: Home / Self Care | Payer: Medicare Other | Source: Ambulatory Visit | Attending: Orthopedic Surgery | Admitting: Orthopedic Surgery

## 2011-04-21 ENCOUNTER — Ambulatory Visit (INDEPENDENT_AMBULATORY_CARE_PROVIDER_SITE_OTHER)
Admission: RE | Admit: 2011-04-21 | Discharge: 2011-04-21 | Disposition: A | Discharge status: Home / Self Care | Payer: Medicare Other | Source: Ambulatory Visit | Attending: Orthopedic Surgery | Admitting: Orthopedic Surgery

## 2011-04-21 DIAGNOSIS — IMO0002 Reserved for concepts with insufficient information to code with codable children: Secondary | ICD-10-CM

## 2011-04-21 DIAGNOSIS — M4120 Other idiopathic scoliosis, site unspecified: Secondary | ICD-10-CM | POA: Insufficient documentation

## 2011-04-21 DIAGNOSIS — G809 Cerebral palsy, unspecified: Secondary | ICD-10-CM

## 2011-04-23 ENCOUNTER — Ambulatory Visit: Payer: Managed Care, Other (non HMO) | Admitting: Physical Therapy

## 2011-04-24 ENCOUNTER — Telehealth: Payer: Self-pay | Admitting: *Deleted

## 2011-04-24 MED ORDER — PAROXETINE HCL 30 MG PO TABS: 30.0000 mg | ORAL_TABLET | Freq: Every day | ORAL | Status: DC

## 2011-04-24 NOTE — Telephone Encounter (Signed)
Increase paxil to 30 mg daily Is she giving him the valium?---- can be given up to 4x a day if needed until paxil starts to work

## 2011-04-24 NOTE — Telephone Encounter (Signed)
Discussed with Bonita Quin and she voiced understanding, Rx sent to the pharmacy. She will follow up in a week if no improvement     KP

## 2011-04-27 ENCOUNTER — Telehealth: Payer: Self-pay | Admitting: *Deleted

## 2011-04-27 ENCOUNTER — Ambulatory Visit: Payer: Medicare Other

## 2011-04-27 ENCOUNTER — Ambulatory Visit: Payer: Medicare Other | Admitting: Physical Therapy

## 2011-04-27 NOTE — Telephone Encounter (Signed)
Ok to give verbal order.

## 2011-04-27 NOTE — Telephone Encounter (Signed)
Forwarded to [facility]      KP

## 2011-04-27 NOTE — Telephone Encounter (Signed)
I spoke with speech pathology about the pt. The pt has already had esophogram (barium swallow) that was ordered by Dr. Juanda Chance and speech pathology is recommending modified barium swallow. The previous study was limited due to amount of barium swallowed and patient participation. Speech pathologist notes that the pt would be better served by the modified barium swallow and is already receiving therapy with them at this time. I advised that Dr. Laury Axon would allow this and he will fax order to our office.

## 2011-04-27 NOTE — Telephone Encounter (Signed)
Order rcv'd, signed and Faxed back to Rehab    KP

## 2011-04-28 ENCOUNTER — Other Ambulatory Visit: Payer: Self-pay | Admitting: Family Medicine

## 2011-04-28 MED ORDER — DOXYCYCLINE HYCLATE 100 MG PO TABS: 100.0000 mg | ORAL_TABLET | Freq: Two times a day (BID) | ORAL | Status: AC

## 2011-04-28 NOTE — Telephone Encounter (Signed)
Rx faxed.    KP 

## 2011-04-30 ENCOUNTER — Ambulatory Visit: Payer: Medicare Other | Admitting: Physical Therapy

## 2011-04-30 ENCOUNTER — Other Ambulatory Visit: Payer: Self-pay | Admitting: Family Medicine

## 2011-04-30 MED ORDER — DIVALPROEX SODIUM 125 MG PO CPSP: ORAL_CAPSULE | ORAL | Status: DC

## 2011-04-30 NOTE — Telephone Encounter (Signed)
Last seen 04/07/11 and filled 12/25/10 # [phone] with 2 refills. Please advise    KP

## 2011-05-04 ENCOUNTER — Ambulatory Visit: Payer: Medicare Other | Attending: Orthopedic Surgery | Admitting: Physical Therapy

## 2011-05-04 DIAGNOSIS — M6281 Muscle weakness (generalized): Secondary | ICD-10-CM | POA: Insufficient documentation

## 2011-05-04 DIAGNOSIS — G809 Cerebral palsy, unspecified: Secondary | ICD-10-CM | POA: Insufficient documentation

## 2011-05-04 DIAGNOSIS — IMO0001 Reserved for inherently not codable concepts without codable children: Secondary | ICD-10-CM | POA: Insufficient documentation

## 2011-05-08 ENCOUNTER — Other Ambulatory Visit (HOSPITAL_COMMUNITY): Payer: Self-pay | Admitting: Family Medicine

## 2011-05-08 DIAGNOSIS — G809 Cerebral palsy, unspecified: Secondary | ICD-10-CM

## 2011-05-11 ENCOUNTER — Ambulatory Visit: Payer: Medicare Other | Admitting: Physical Therapy

## 2011-05-14 ENCOUNTER — Ambulatory Visit: Payer: Medicare Other | Admitting: Physical Therapy

## 2011-05-14 ENCOUNTER — Ambulatory Visit (INDEPENDENT_AMBULATORY_CARE_PROVIDER_SITE_OTHER): Payer: Medicare Other

## 2011-05-14 DIAGNOSIS — Z23 Encounter for immunization: Secondary | ICD-10-CM

## 2011-05-14 DIAGNOSIS — Z0000 Encounter for general adult medical examination without abnormal findings: Secondary | ICD-10-CM

## 2011-05-19 ENCOUNTER — Ambulatory Visit (HOSPITAL_COMMUNITY)
Admission: RE | Admit: 2011-05-19 | Discharge: 2011-05-19 | Disposition: A | Discharge status: Home / Self Care | Payer: Medicare Other | Source: Ambulatory Visit | Attending: Family Medicine | Admitting: Family Medicine

## 2011-05-19 ENCOUNTER — Ambulatory Visit (HOSPITAL_BASED_OUTPATIENT_CLINIC_OR_DEPARTMENT_OTHER)
Admission: RE | Admit: 2011-05-19 | Discharge: 2011-05-19 | Disposition: A | Discharge status: Home / Self Care | Payer: Medicare Other | Source: Ambulatory Visit | Attending: Family Medicine | Admitting: Family Medicine

## 2011-05-19 ENCOUNTER — Telehealth: Payer: Self-pay

## 2011-05-19 DIAGNOSIS — M25559 Pain in unspecified hip: Secondary | ICD-10-CM | POA: Insufficient documentation

## 2011-05-19 DIAGNOSIS — M25551 Pain in right hip: Secondary | ICD-10-CM

## 2011-05-19 DIAGNOSIS — G809 Cerebral palsy, unspecified: Secondary | ICD-10-CM | POA: Insufficient documentation

## 2011-05-19 DIAGNOSIS — R633 Feeding difficulties, unspecified: Secondary | ICD-10-CM | POA: Insufficient documentation

## 2011-05-19 DIAGNOSIS — R1310 Dysphagia, unspecified: Secondary | ICD-10-CM | POA: Insufficient documentation

## 2011-05-19 NOTE — Telephone Encounter (Signed)
Call from [facility] Physical Therapist with [facility] and she stated the patient is complaining of right Hip pain. She stated she thuoght it was muscular only, but the patient insist on having an X-ray done. He felt better after Therapy but when it was time to stand him up fr the transfer he had the pain upon standing. Hip pain is not related to the back pain.... Grant Fontana also stated she thought it would be good for the patient to see a physiatrist and wanted to know what you thought? Please advise

## 2011-05-19 NOTE — Telephone Encounter (Signed)
Xray can be ordered but we need to know where they want to go

## 2011-05-19 NOTE — Telephone Encounter (Signed)
Patient aware X-ray ordered     KP

## 2011-05-20 ENCOUNTER — Ambulatory Visit: Payer: Medicare Other | Admitting: Physical Therapy

## 2011-05-22 ENCOUNTER — Telehealth: Payer: Self-pay

## 2011-05-22 NOTE — Telephone Encounter (Signed)
Called to patient--- Barium swallow study received and reviewed by Dr.Lowne and they recommend that patient eats a Puree diet, Honey thick liquid and 5-6 extra swallows after bites/sips.       Mssg left for a return call      KP

## 2011-05-25 ENCOUNTER — Ambulatory Visit: Payer: Medicare Other | Admitting: Physical Therapy

## 2011-05-26 ENCOUNTER — Encounter: Payer: Medicare Other | Admitting: *Deleted

## 2011-05-27 ENCOUNTER — Ambulatory Visit: Payer: Medicare Other | Admitting: Physical Therapy

## 2011-05-27 NOTE — Telephone Encounter (Signed)
Discussed with Felecia and paperwork sent  To be scanned    KP

## 2011-06-01 ENCOUNTER — Ambulatory Visit: Payer: Medicare Other | Admitting: Physical Therapy

## 2011-06-01 ENCOUNTER — Encounter: Payer: Self-pay | Admitting: Family Medicine

## 2011-06-02 ENCOUNTER — Ambulatory Visit: Payer: Medicare Other

## 2011-06-03 ENCOUNTER — Ambulatory Visit: Payer: Medicare Other | Attending: Orthopedic Surgery | Admitting: Physical Therapy

## 2011-06-03 DIAGNOSIS — IMO0001 Reserved for inherently not codable concepts without codable children: Secondary | ICD-10-CM | POA: Insufficient documentation

## 2011-06-03 DIAGNOSIS — G809 Cerebral palsy, unspecified: Secondary | ICD-10-CM | POA: Insufficient documentation

## 2011-06-03 DIAGNOSIS — M6281 Muscle weakness (generalized): Secondary | ICD-10-CM | POA: Insufficient documentation

## 2011-06-09 ENCOUNTER — Ambulatory Visit: Payer: Medicare Other | Admitting: Physical Therapy

## 2011-06-09 ENCOUNTER — Ambulatory Visit: Payer: Medicare Other

## 2011-06-12 ENCOUNTER — Ambulatory Visit: Payer: Medicare Other

## 2011-06-15 ENCOUNTER — Ambulatory Visit: Payer: Medicare Other | Admitting: Physical Therapy

## 2011-06-15 ENCOUNTER — Ambulatory Visit: Payer: Medicare Other

## 2011-06-17 ENCOUNTER — Ambulatory Visit: Payer: Medicare Other

## 2011-06-22 ENCOUNTER — Encounter (HOSPITAL_BASED_OUTPATIENT_CLINIC_OR_DEPARTMENT_OTHER): Payer: Self-pay | Admitting: *Deleted

## 2011-06-22 ENCOUNTER — Ambulatory Visit (HOSPITAL_COMMUNITY)
Admit: 2011-06-22 | Discharge: 2011-06-22 | Disposition: A | Discharge status: Home / Self Care | Payer: Medicare Other | Source: Other Acute Inpatient Hospital | Attending: Emergency Medicine | Admitting: Emergency Medicine

## 2011-06-22 ENCOUNTER — Emergency Department (HOSPITAL_BASED_OUTPATIENT_CLINIC_OR_DEPARTMENT_OTHER)
Admission: EM | Admit: 2011-06-22 | Discharge: 2011-06-22 | Disposition: A | Discharge status: Home / Self Care | Payer: Medicare Other | Source: Home / Self Care | Attending: Emergency Medicine | Admitting: Emergency Medicine

## 2011-06-22 DIAGNOSIS — J45909 Unspecified asthma, uncomplicated: Secondary | ICD-10-CM | POA: Insufficient documentation

## 2011-06-22 DIAGNOSIS — G809 Cerebral palsy, unspecified: Secondary | ICD-10-CM | POA: Insufficient documentation

## 2011-06-22 DIAGNOSIS — Z431 Encounter for attention to gastrostomy: Secondary | ICD-10-CM | POA: Insufficient documentation

## 2011-06-22 DIAGNOSIS — E785 Hyperlipidemia, unspecified: Secondary | ICD-10-CM | POA: Insufficient documentation

## 2011-06-22 MED ORDER — IOHEXOL 300 MG/ML  SOLN
50.0000 mL | Freq: Once | INTRAMUSCULAR | Status: AC | PRN
  Administered 2011-06-22: 6 mL

## 2011-06-22 NOTE — ED Provider Notes (Signed)
History     CSN: [id number] Arrival date & time: 06/22/2011 10:30 AM  No chief complaint on file.  The history is provided by a caregiver. The history is limited by a language barrier.   patient with a history of a G-tube.  The G-tube incidentally came out this morning at approximately 9:30 AM.  The patient receives all his medications and food through the G-tube.  Presents to the ER today in hopes of G-tube replacement.  No recent complaints.  No nausea no vomiting no fevers.  Past Medical History  Diagnosis Date  . Cerebral palsy     Past Surgical History  Procedure Date  . Spine surgery ,11/20/2010, 2011  . Eye surgery   . Ears tubes   . Hamstring released   . Baclofen trial   . Baslofen pump implant   . Spinal fusion   . G-tube insert August 2006  . Spinal fusioncorrect 106 degree kyphosis   . Spinal fusion to correct 70 degree kyphosis     Family History  Problem Relation Age of Onset  . Asthma Mother   . Hyperlipidemia Mother     History  Substance Use Topics  . Smoking status: Never Smoker   . Smokeless tobacco: Not on file  . Alcohol Use: No      Review of Systems  All other systems reviewed and are negative.    Physical Exam  BP 110/77  Pulse 113  Temp(Src) 98.9 F (37.2 C) (Oral)  Resp 20  Ht 5\' 5"  (1.651 m)  Wt 115 lb (52.164 kg)  BMI 19.14 kg/m2  SpO2 97%  Physical Exam  Constitutional: He is oriented to person, place, and time. He appears well-developed and well-nourished.  HENT:  Head: Normocephalic.  Eyes: EOM are normal.  Neck: Normal range of motion.  Pulmonary/Chest: Effort normal.  Abdominal: Soft. There is no tenderness.       Well formed gastrostomy stoma.  Without signs of infection around the stoma  Musculoskeletal: Normal range of motion.  Neurological: He is alert and oriented to person, place, and time.  Psychiatric: He has a normal mood and affect.    ED Course  Gastrostomy tube replacement Date/Time: 06/22/2011 11:16  AM Performed by: Lyanne Co Authorized by: Lyanne Co Consent: Verbal consent obtained. Risks and benefits: risks, benefits and alternatives were discussed Consent given by: guardian Required items: required blood products, implants, devices, and special equipment available Patient identity confirmed: verbally with patient Preparation: Patient was prepped and draped in the usual sterile fashion. Local anesthesia used: no Patient sedated: no Patient tolerance: Patient tolerated the procedure well with no immediate complications. Comments: 87 French Foley catheter was placed without difficulty in the prior gastrostomy stoma.    MDM T-tube placed without difficulty.  This is a well-formed stoma.  I spoke with interventional radiology at [facility].  They will exchange his G-tube today.  Patient will be discharged home at this time.  He was given his home medications by his caregiver.  He would drive directly from the emergency department to [facility] for placement and exchange of his G-tube      Lyanne Co, MD 06/22/11 1118

## 2011-06-22 NOTE — ED Notes (Signed)
Patients caregiver states that the patients G-Tube came out this morning.

## 2011-06-22 NOTE — ED Notes (Signed)
MD at bedside. Attempting GTube placement by Dr. Patria Mane.

## 2011-06-23 ENCOUNTER — Ambulatory Visit: Payer: Medicare Other

## 2011-06-26 ENCOUNTER — Ambulatory Visit: Payer: Medicare Other

## 2011-06-26 ENCOUNTER — Telehealth: Payer: Self-pay | Admitting: Internal Medicine

## 2011-06-26 NOTE — Telephone Encounter (Signed)
Spoke with patient's mother and she states the patient's tube came out on Monday. She and her husband were out of town and the aide took him to [facility]. She states he had the tube replaced but she did not see any follow up/discharge instructions when she got home.( He was taken to IR for replacement of the tube at that time. Per IR records he had a 22 G [place] balloon retention gastrostomy tube placed.)She was unaware that IR had placed the tube and not [place]. Read her the report with type of tube placed. She wanted to know if he should have f/u visit with Dr. Juanda Chance. Please, advise.

## 2011-06-27 NOTE — Telephone Encounter (Signed)
According to the IR note, pt had 22 F Foley catheter placed. His prior PEG was a 70 F Push gastrostomy. He ought to have the folley catheter replaced with a standard  30F [employer] catheter. You may schedule it for my hospital week.

## 2011-06-29 ENCOUNTER — Ambulatory Visit: Payer: Medicare Other | Admitting: Physical Therapy

## 2011-06-29 ENCOUNTER — Ambulatory Visit: Payer: Medicare Other

## 2011-06-29 NOTE — Telephone Encounter (Signed)
  Clinical Data: Indwelling gastrostomy tube has inadvertently come  out. The patient requires replacement.  GASTROSTOMY CATHETER REPLACEMENT  Contrast: 6 ml Omnipaque-300  Fluoro time: 0.1 minutes  Comparison: None.  Findings: A new 20-French balloon retention gastrostomy tube was  advanced through the indwelling percutaneous tract. The retention  balloon was inflated with saline and the catheter checked with  contrast material to confirm position. Fluoroscopy demonstrates  intraluminal positioning within the stomach.  IMPRESSION:  Replacement of gastrostomy with new 20-French balloon retention  gastrostomy tube. The catheter lies in the stomach.  Original Report Authenticated By: Reola Calkins, M.D.      Imaging     IR Replc Gastro/Colonic Tube Percut W/Fluoro (Order #[id number]) on 06/22/2011 - Imaging Information         Imaging   Dr. Juanda Chance,  I am confused about the note you sent.The above report was from IR on 06/22/11. Does the patient need to have this replaced to the standard 24 F [employer] catheter? Please, advise.

## 2011-06-30 NOTE — Telephone Encounter (Signed)
Spoke with patient's mother and she states the PEG they have now is functioning well for them. States the medications actually are going through more easily. She does not wish to change the catheter at this time.

## 2011-06-30 NOTE — Telephone Encounter (Signed)
Ok db

## 2011-06-30 NOTE — Telephone Encounter (Signed)
The PEG that was replaced in IR  Is not the one that I usually put in. It will have to be changed. , unless pt's mother tells you that it is functioning  well and that she does not want to have it changed again. But  If I have to change it to 3F, then I will be able to do that during my next hospital week.

## 2011-07-03 ENCOUNTER — Ambulatory Visit: Payer: Medicare Other

## 2011-07-03 IMAGING — CR DG ABDOMEN 2V
3 series · 3 of 3 positions shown · non-contrast
Comparison: 02/06/2010 CT and 01/29/2007 abdominal radiograph

CLINICAL DATA: Abdominal pain and vomiting.  History of recent
spinal fusion.

ABDOMEN - 2 VIEW

[w abdomen decub *]
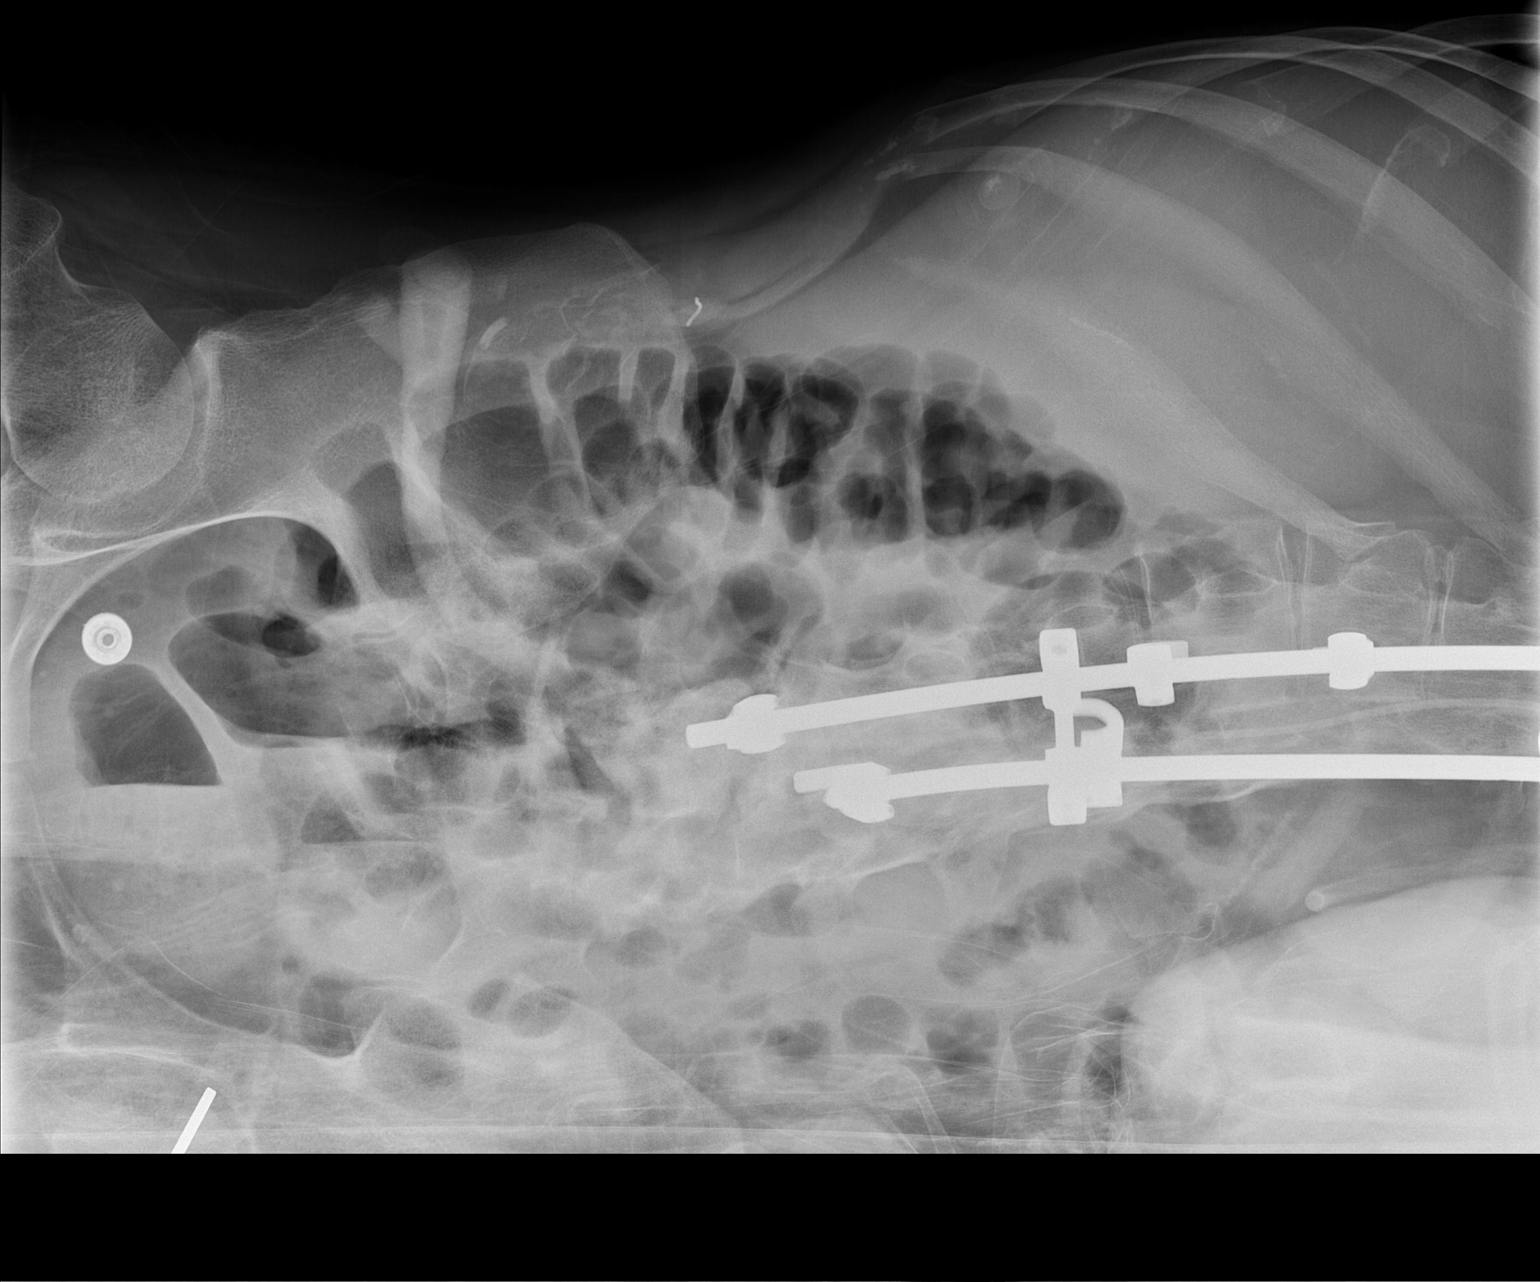

[w abdomen decub]
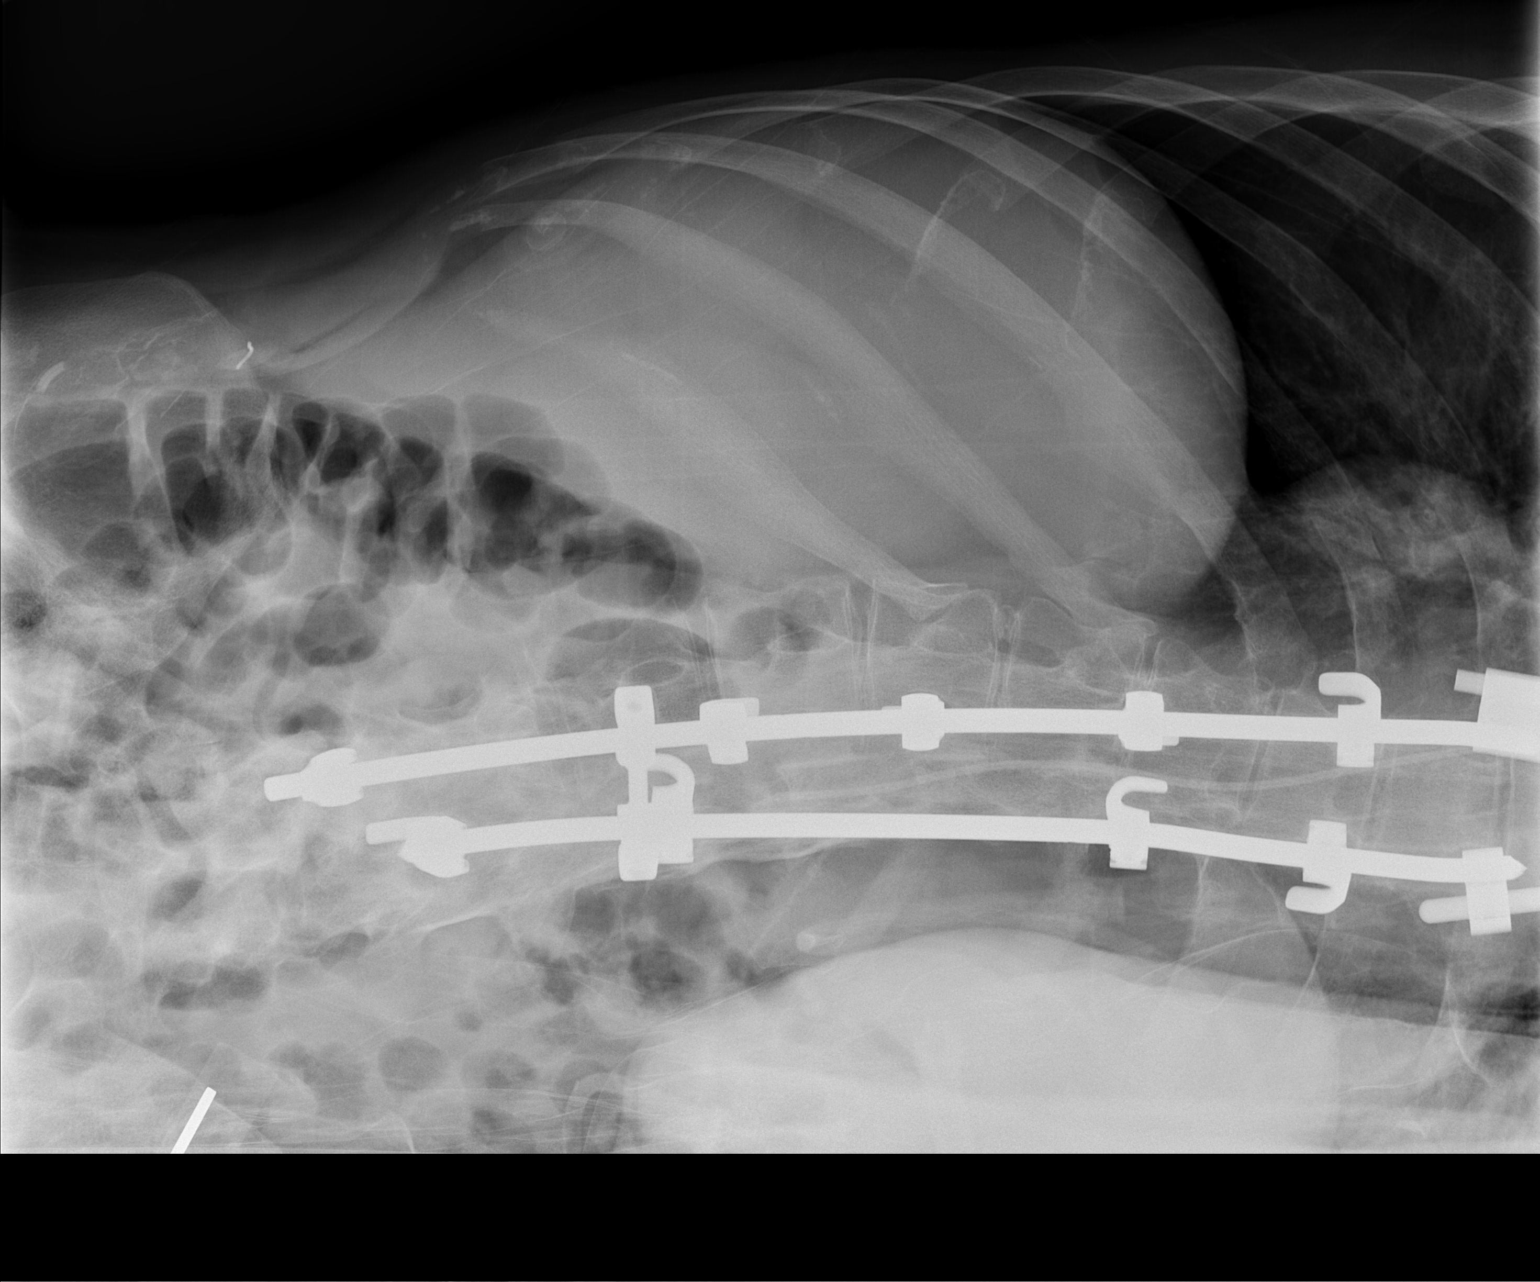

[t abdomen supine]
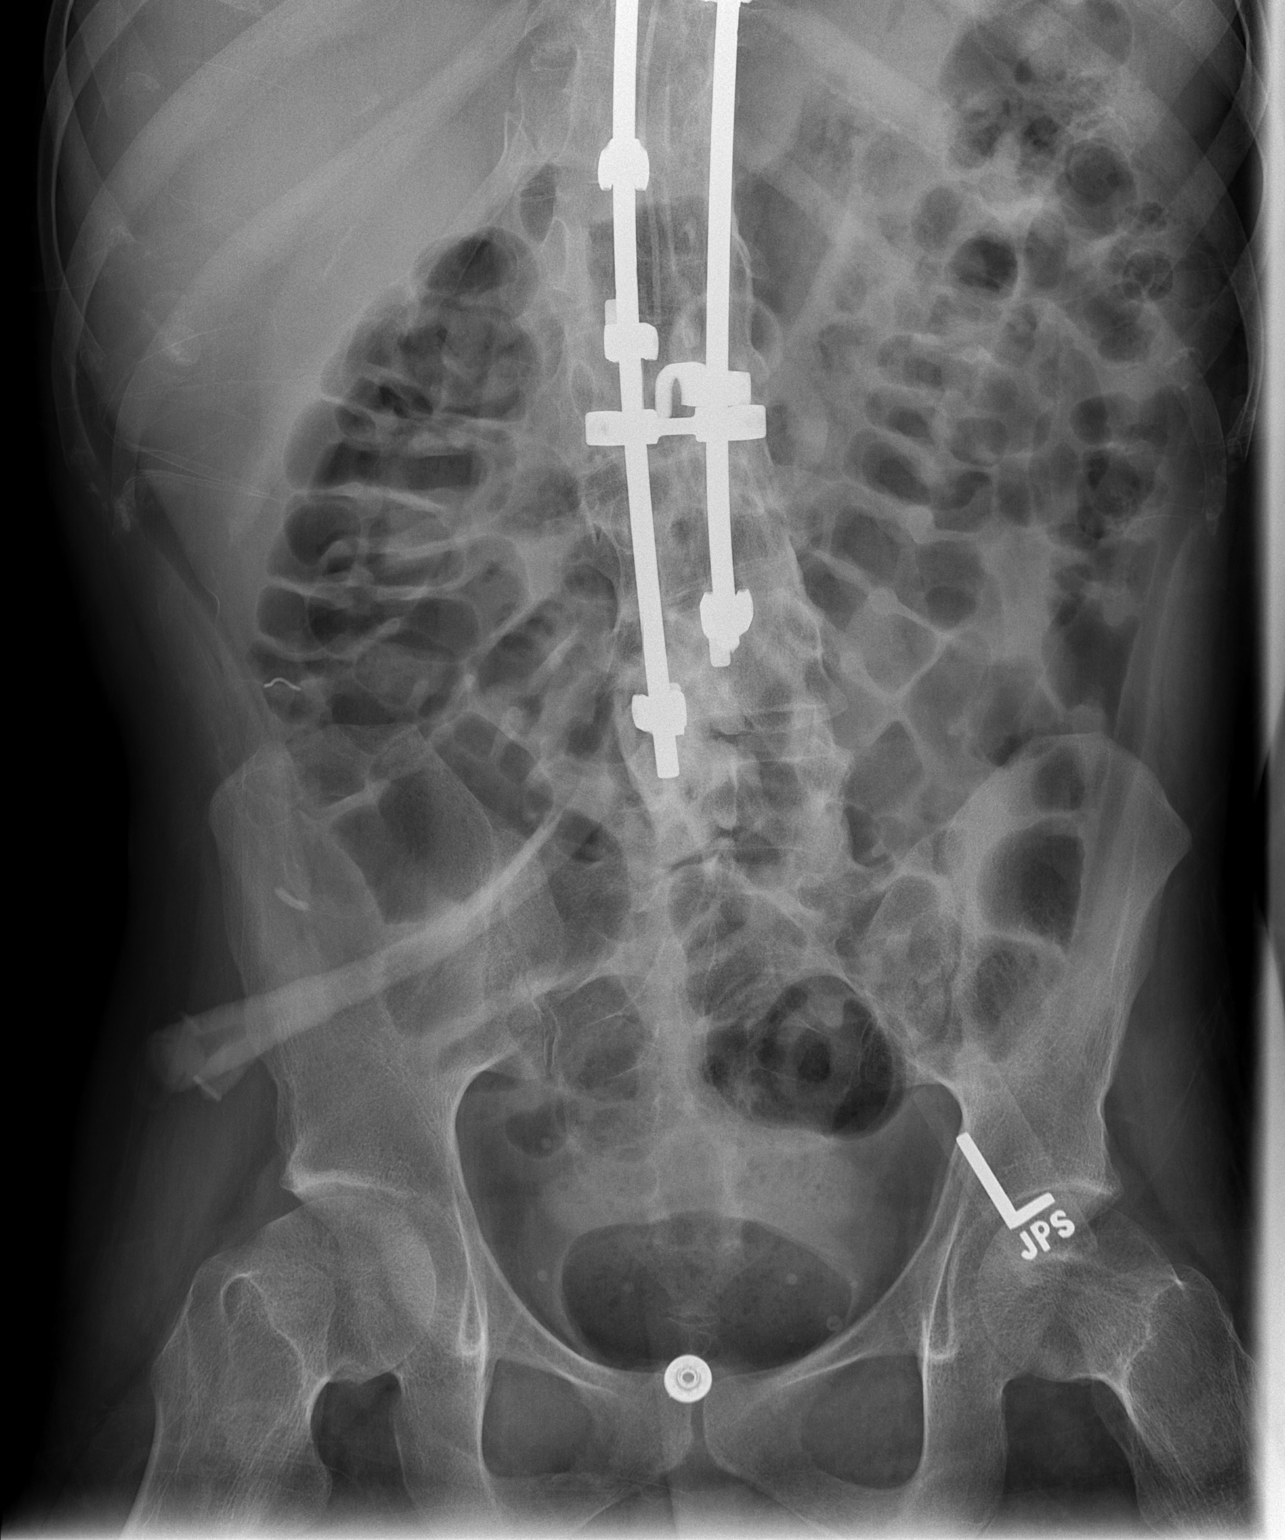

[3 of 3 positions shown; findings below may reference images not displayed]

FINDINGS: Gas filled loops of small bowel, colon and rectum are
noted.
No definite evidence of pneumoperitoneum identified.
A gastrostomy tube is present.
No suspicious calcifications are identified.
No acute bony abnormalities are noted.
IMPRESSION: Nonspecific nonobstructive bowel gas pattern - question mild ileus.

## 2011-07-03 IMAGING — CR DG CHEST 2V
1 series · 1 of 1 positions shown · non-contrast
Comparison: 09/12/2010

CLINICAL DATA: Pain and vomiting.  Recent spinal fusion.

CHEST - 2 VIEW

[view not recorded]
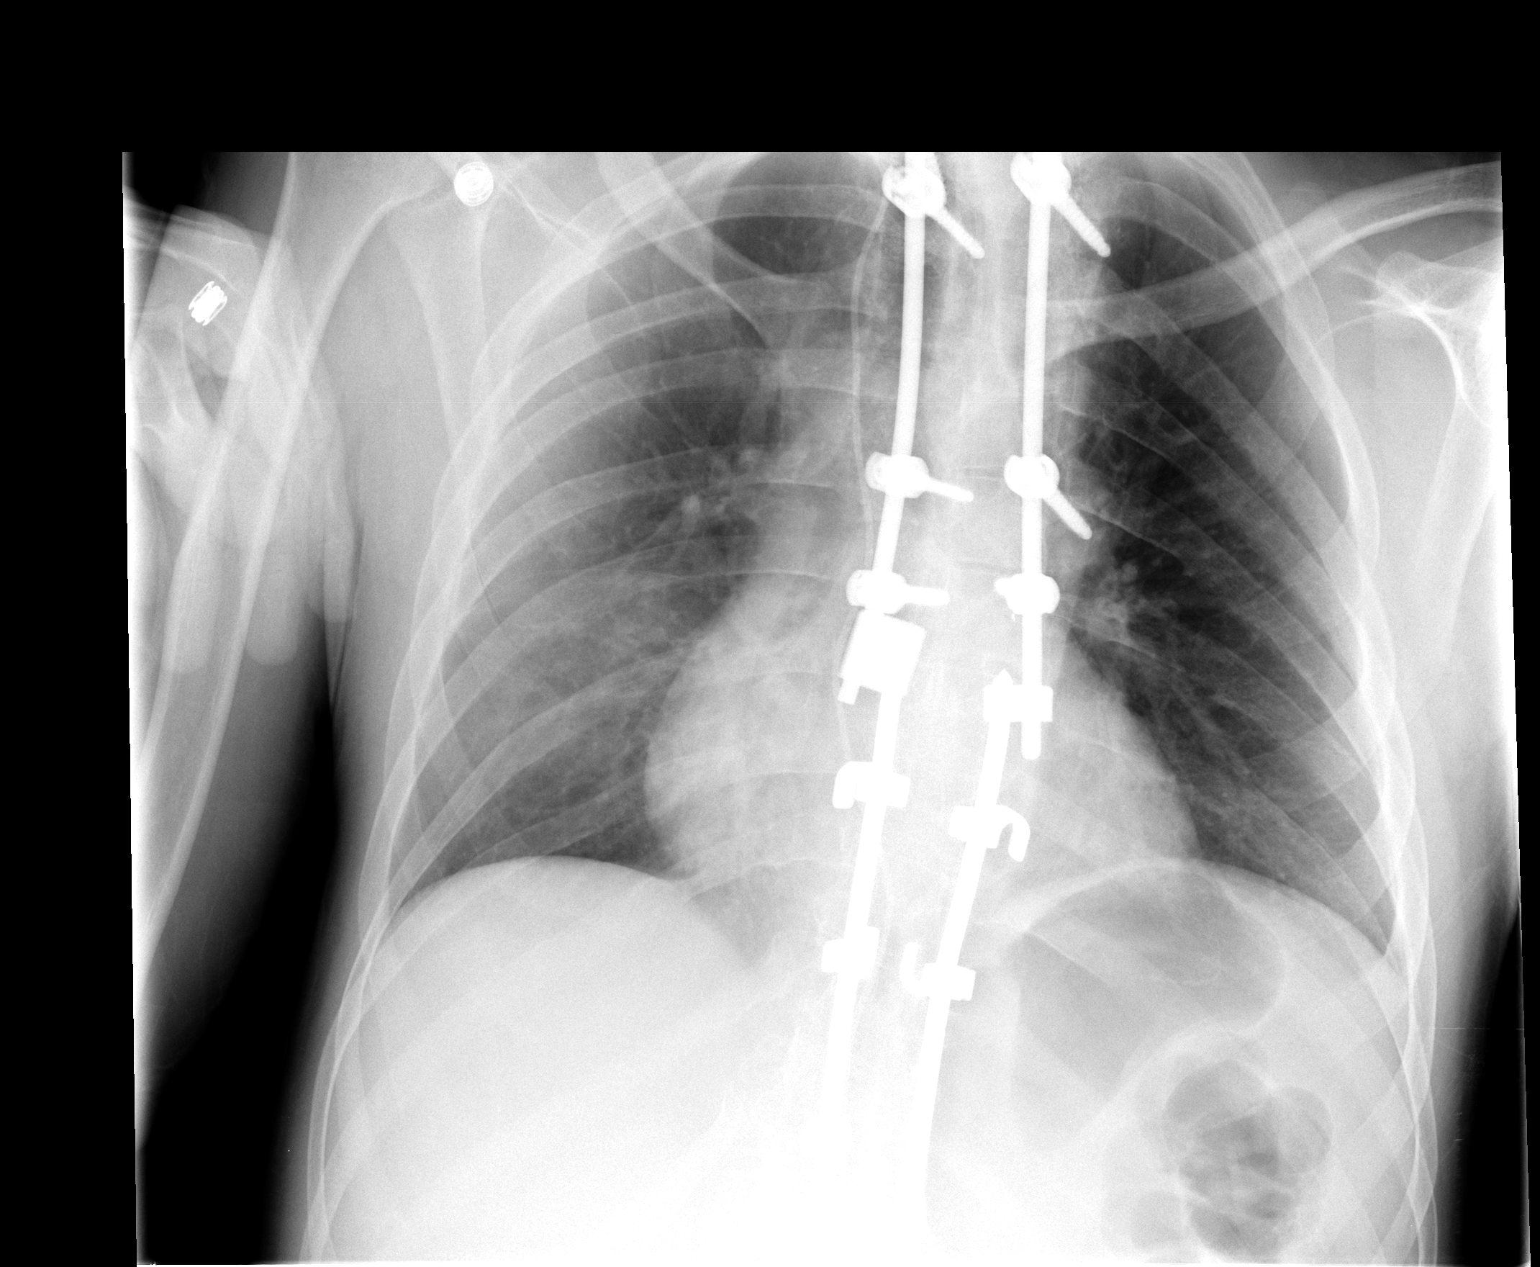

[1 of 1 positions shown; findings below may reference images not displayed]

FINDINGS: The cardiomediastinal silhouette is unremarkable.
Mild posterior lower lobe atelectasis/airspace disease is noted on
the lateral view.
There is no evidence of pneumothorax, pleural effusion or pulmonary
edema.
Posterior spinal fixation rods are again noted and appear
unchanged.
IMPRESSION: Mild posterior lower lobe atelectasis versus airspace disease.
Pneumonia is not excluded.

## 2011-07-04 IMAGING — CR DG ABDOMEN 2V
2 series · 2 of 2 positions shown · non-contrast
Comparison: 11/27/2010 and earlier.

CLINICAL DATA: 24-year-old male with ileus.

ABDOMEN - 2 VIEW

[w abdomen upright]
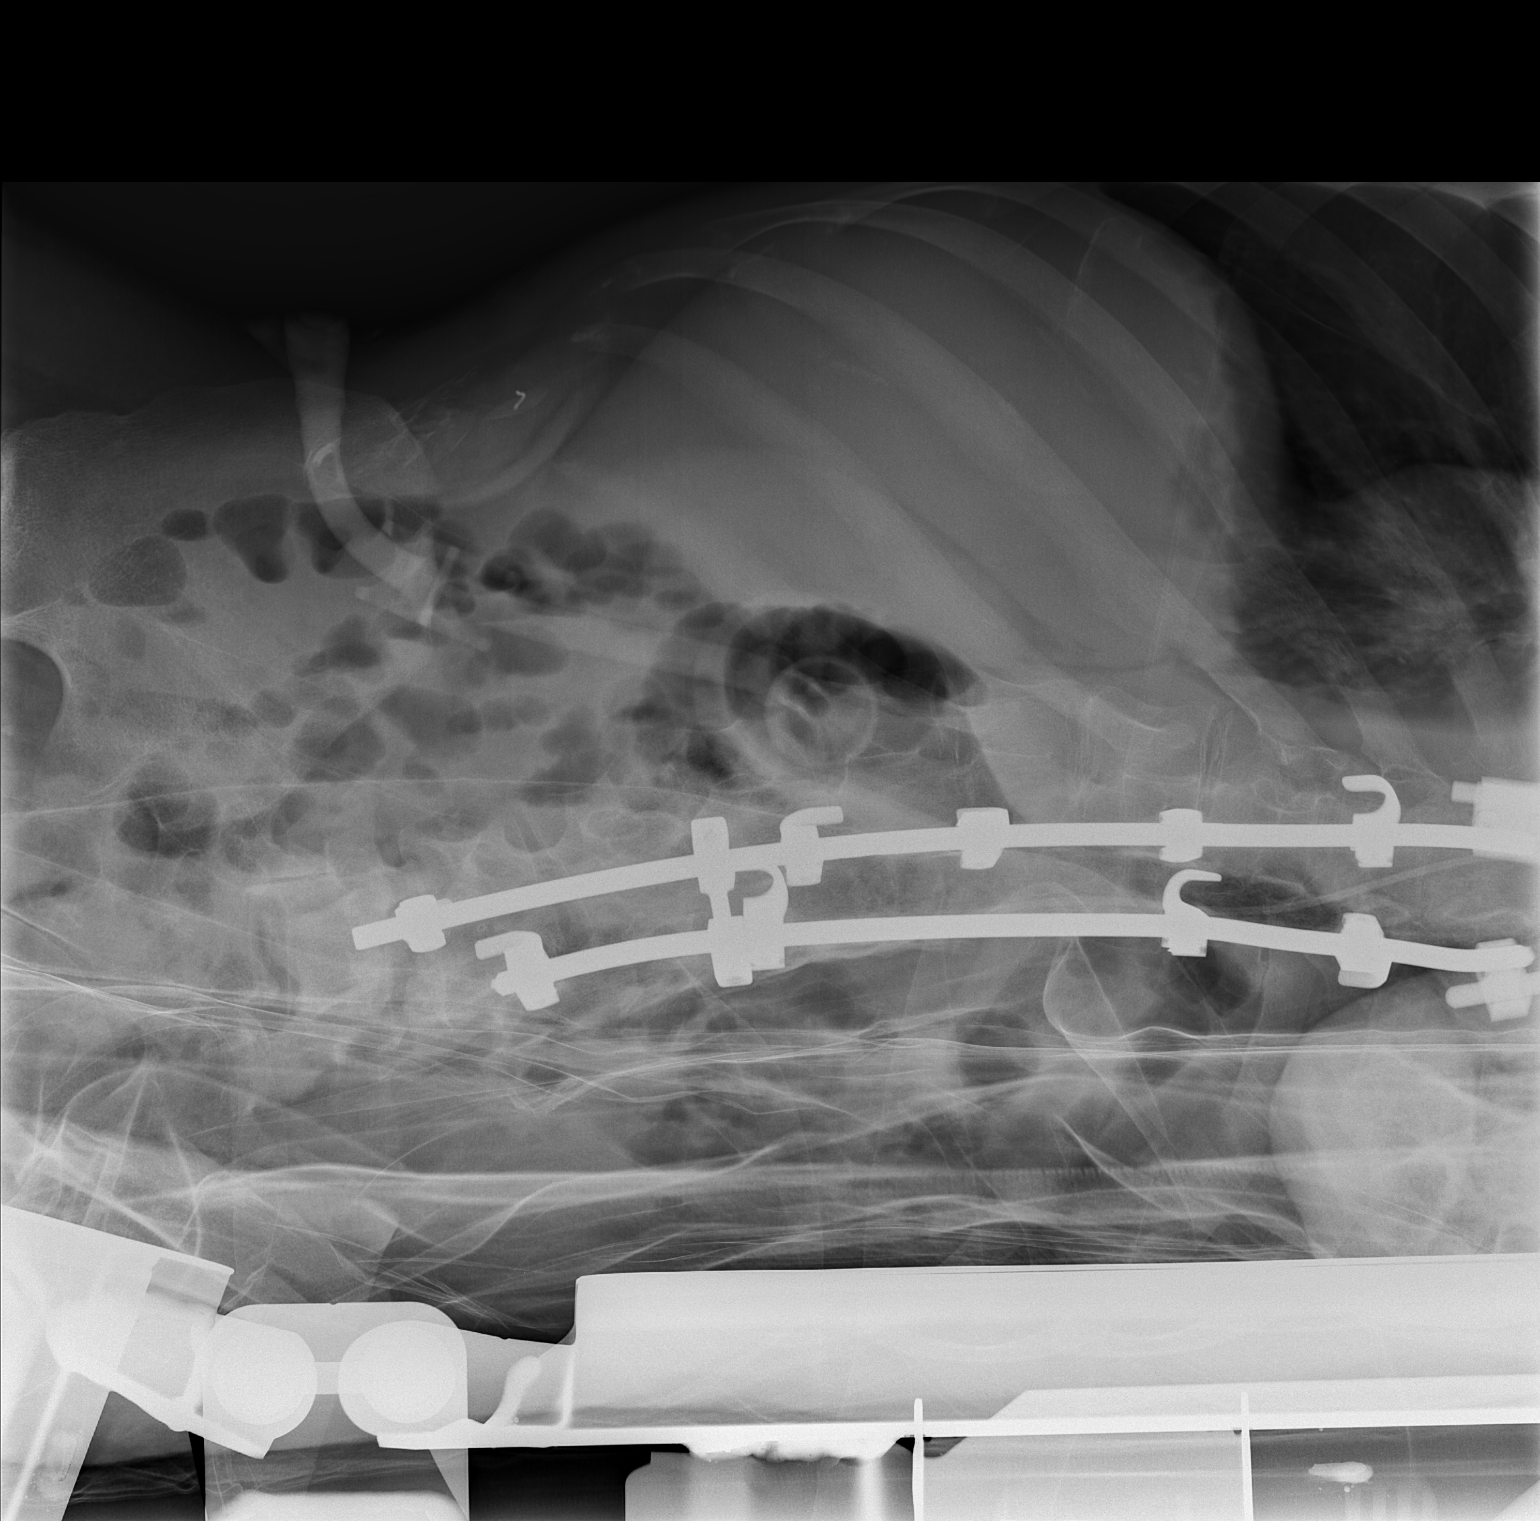

[t abdomen supine]
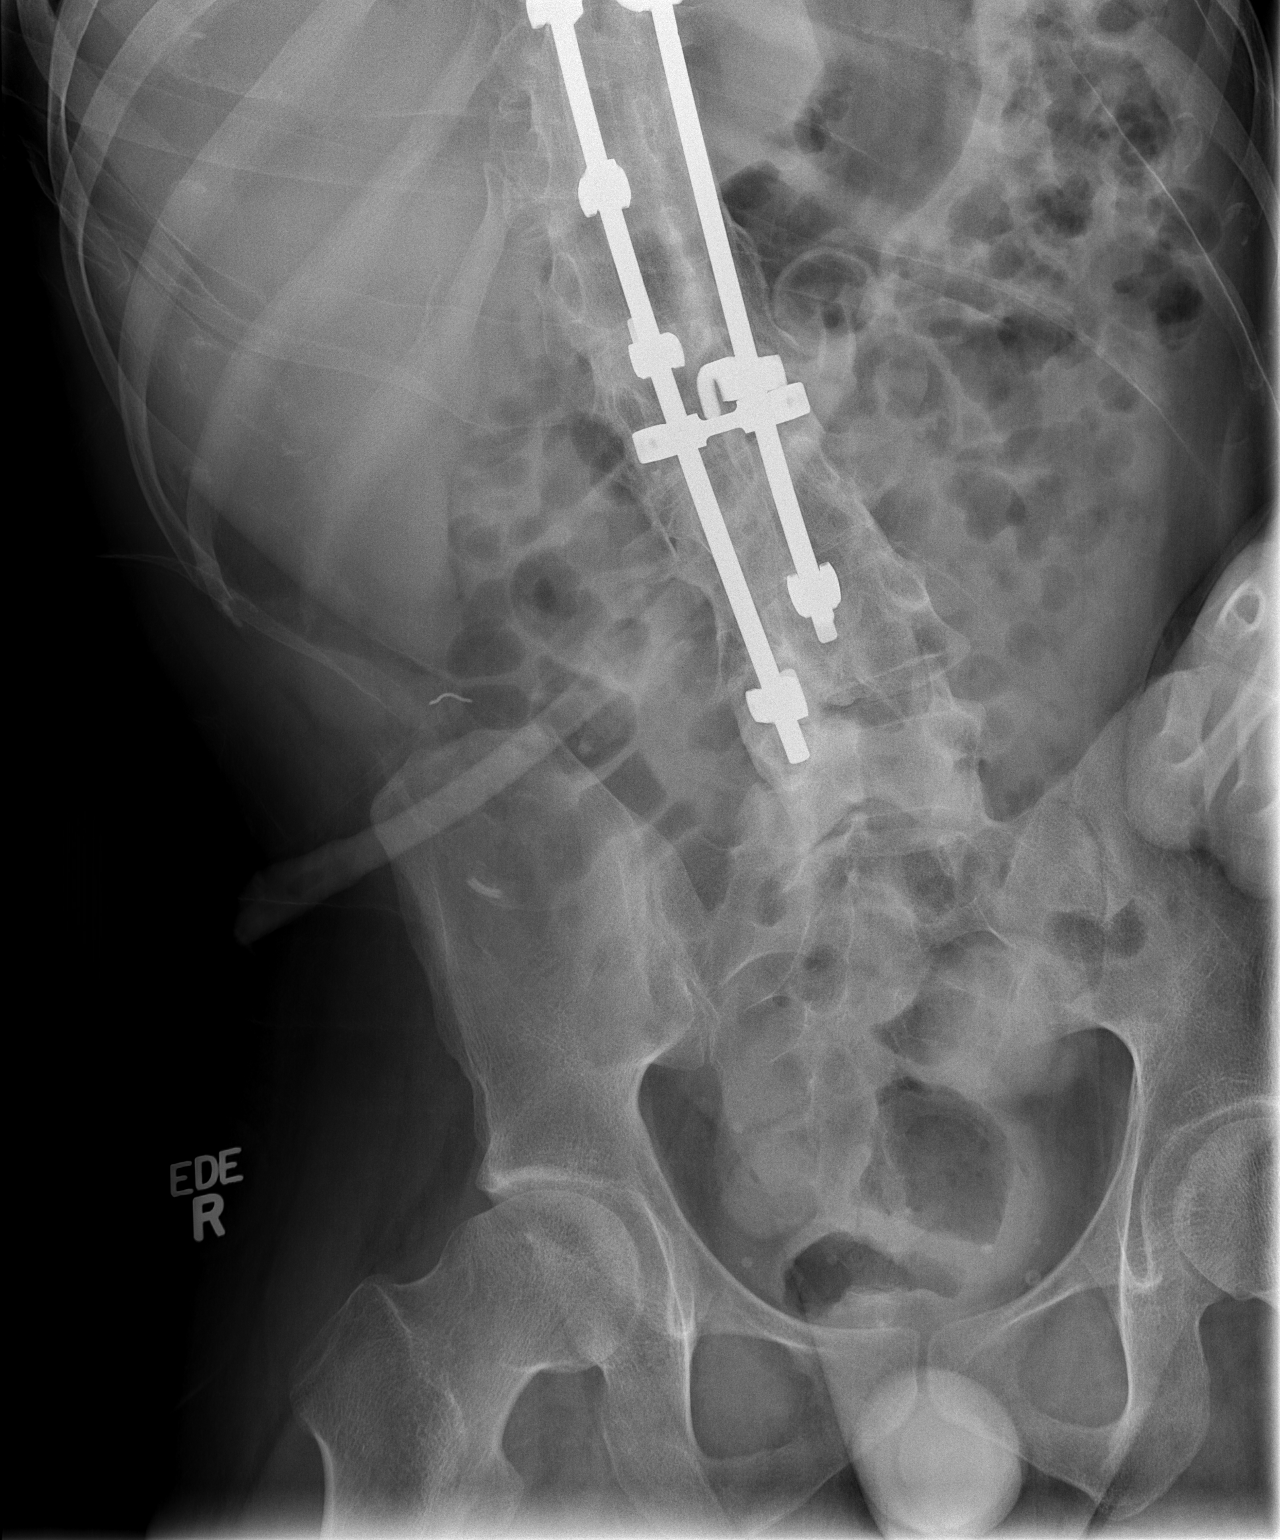

[2 of 2 positions shown; findings below may reference images not displayed]

FINDINGS: Chronic spinal deformity with posterior spinal hardware
re-identified.  No pneumoperitoneum on left side down lateral
decubitus view. Nonobstructed bowel gas pattern.  Surgical clips in
the right lower quadrant re-identified.  Pelvic phleboliths.
IMPRESSION: Nonobstructed bowel gas pattern, no free air.

## 2011-07-06 IMAGING — CR DG CHEST 1V
1 series · 1 of 1 positions shown · non-contrast
Comparison: Chest x-ray of 11/27/2010

CLINICAL DATA: Pneumonia

CHEST - 1 VIEW

[view not recorded]
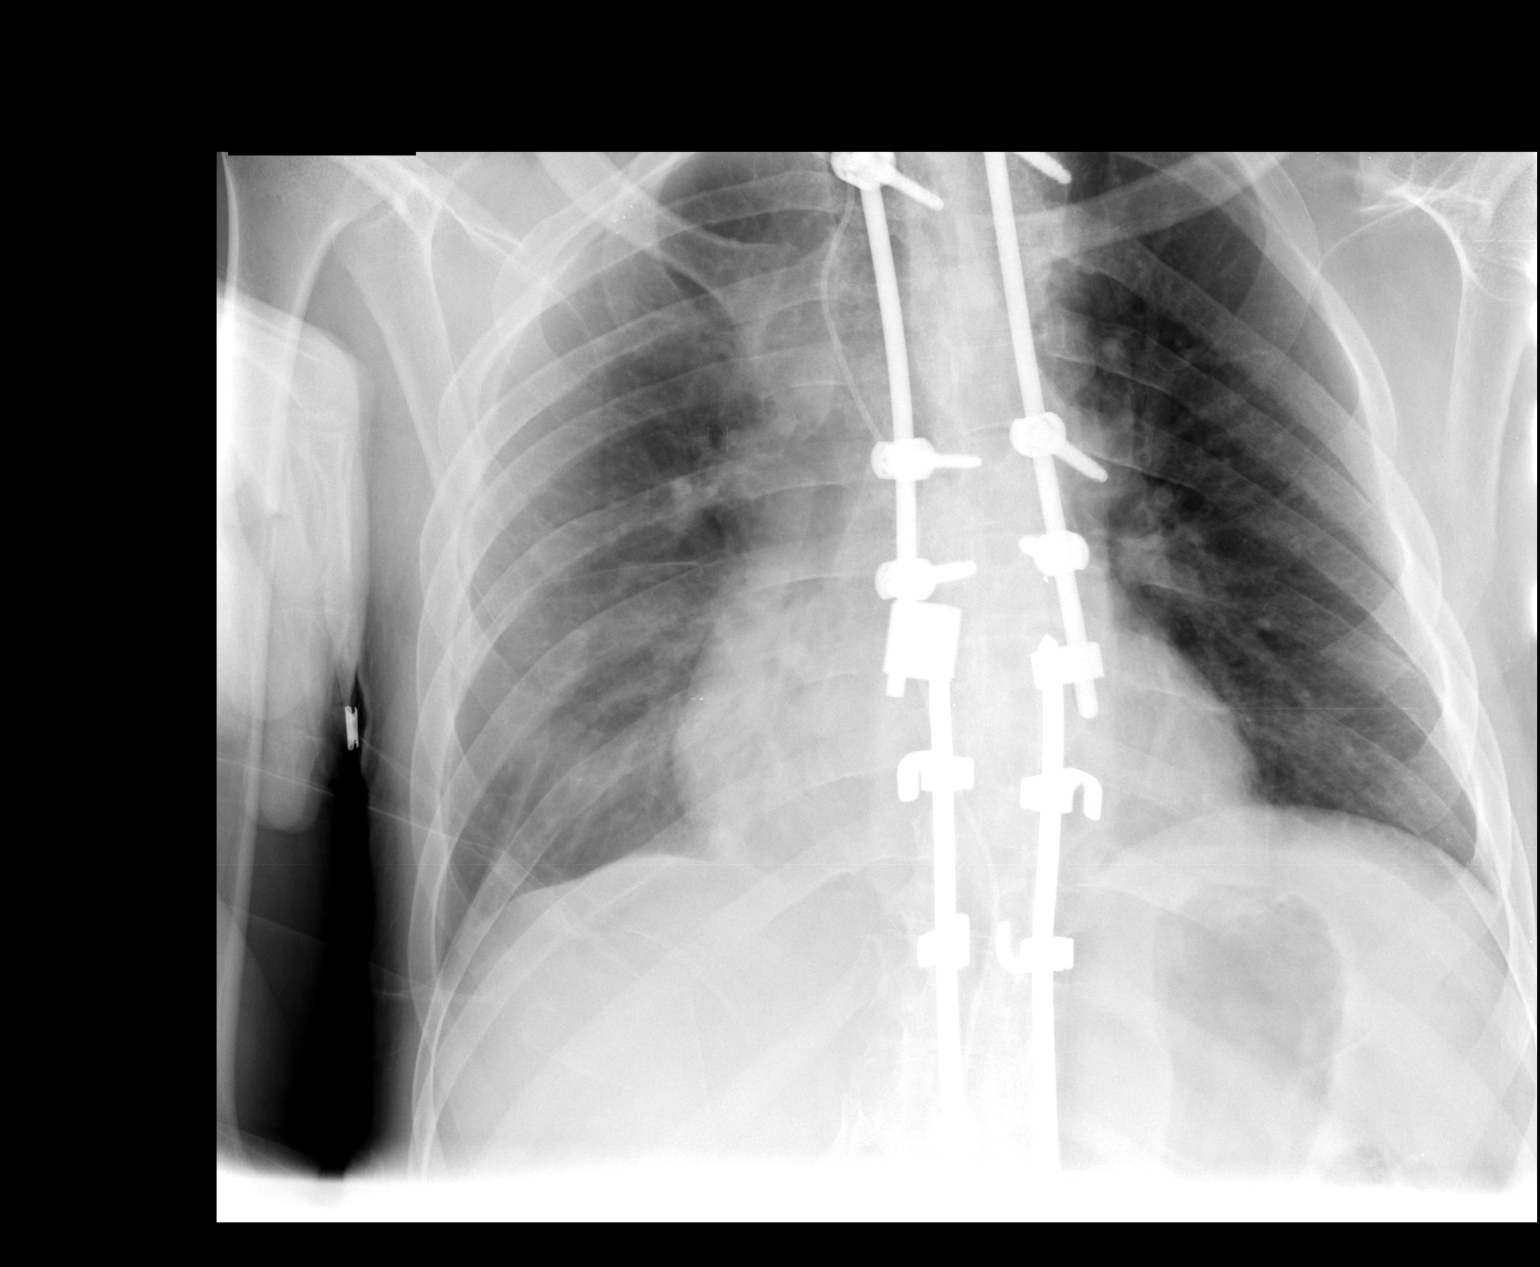

[1 of 1 positions shown; findings below may reference images not displayed]

FINDINGS: There may be mild volume loss at the right lung base but
no focal infiltrate is seen on this portable semi-erect view.  Two-
view chest x-ray is recommended if possible.  The left lung appears
clear.  Cardiomegaly is stable.  Right IJ central venous catheter
tip is seen to the mid upper SVC.  Hardware for fixation of the
thoracic spine appears stable.
IMPRESSION: No definite pneumonia.  Possible mild atelectasis and/or small
effusion at the right lung base.  Recommend two-view chest x-ray if
possible.

## 2011-07-07 ENCOUNTER — Ambulatory Visit: Payer: Medicare Other | Attending: Orthopedic Surgery

## 2011-07-07 DIAGNOSIS — M6281 Muscle weakness (generalized): Secondary | ICD-10-CM | POA: Insufficient documentation

## 2011-07-07 DIAGNOSIS — G809 Cerebral palsy, unspecified: Secondary | ICD-10-CM | POA: Insufficient documentation

## 2011-07-07 DIAGNOSIS — IMO0001 Reserved for inherently not codable concepts without codable children: Secondary | ICD-10-CM | POA: Insufficient documentation

## 2011-07-09 ENCOUNTER — Ambulatory Visit: Payer: Medicare Other

## 2011-07-09 IMAGING — CR DG ABDOMEN ACUTE W/ 1V CHEST
2 series · 2 of 2 positions shown · non-contrast
Comparison: 11/28/2010 and earlier.

CLINICAL DATA: 24-year-old male with possible obstruction,
increased 2 feet residual.  Cerebral palsy.

ACUTE ABDOMEN SERIES (ABDOMEN 2 VIEW & CHEST 1 VIEW)

[view not recorded (1 of 2)]
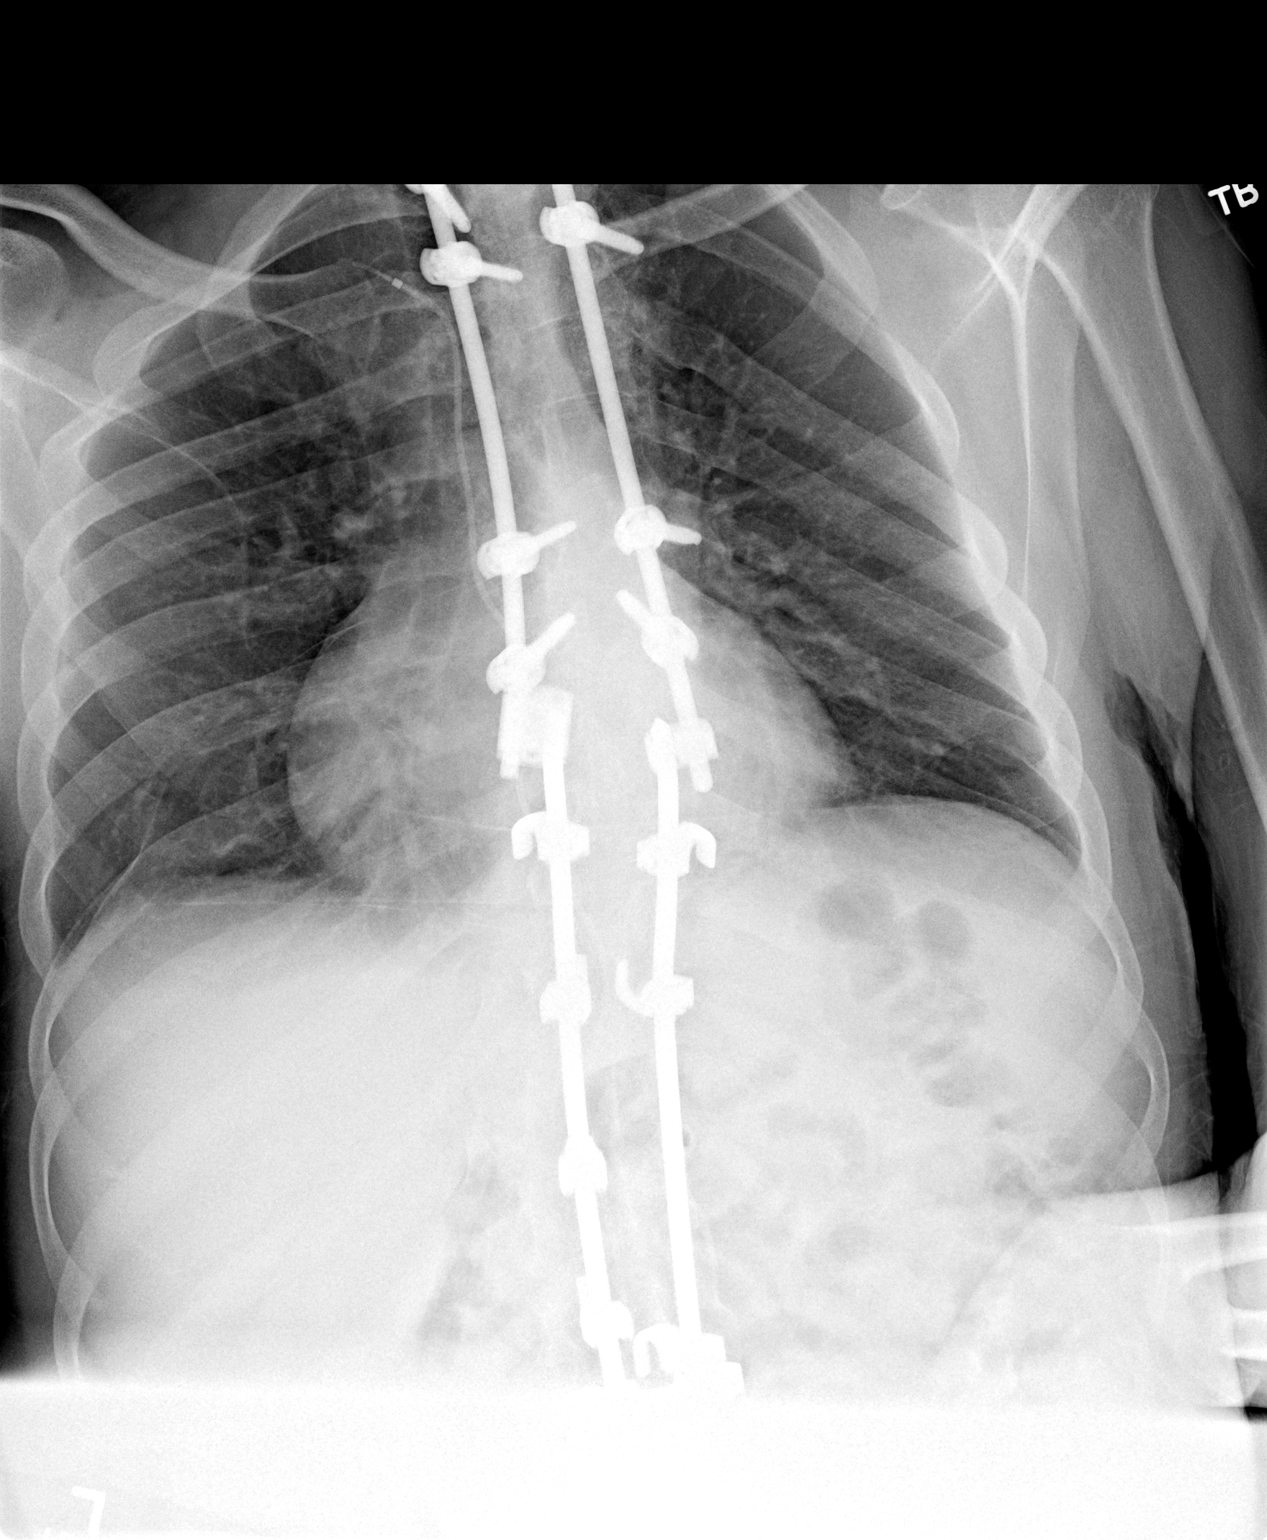

[view not recorded (2 of 2)]
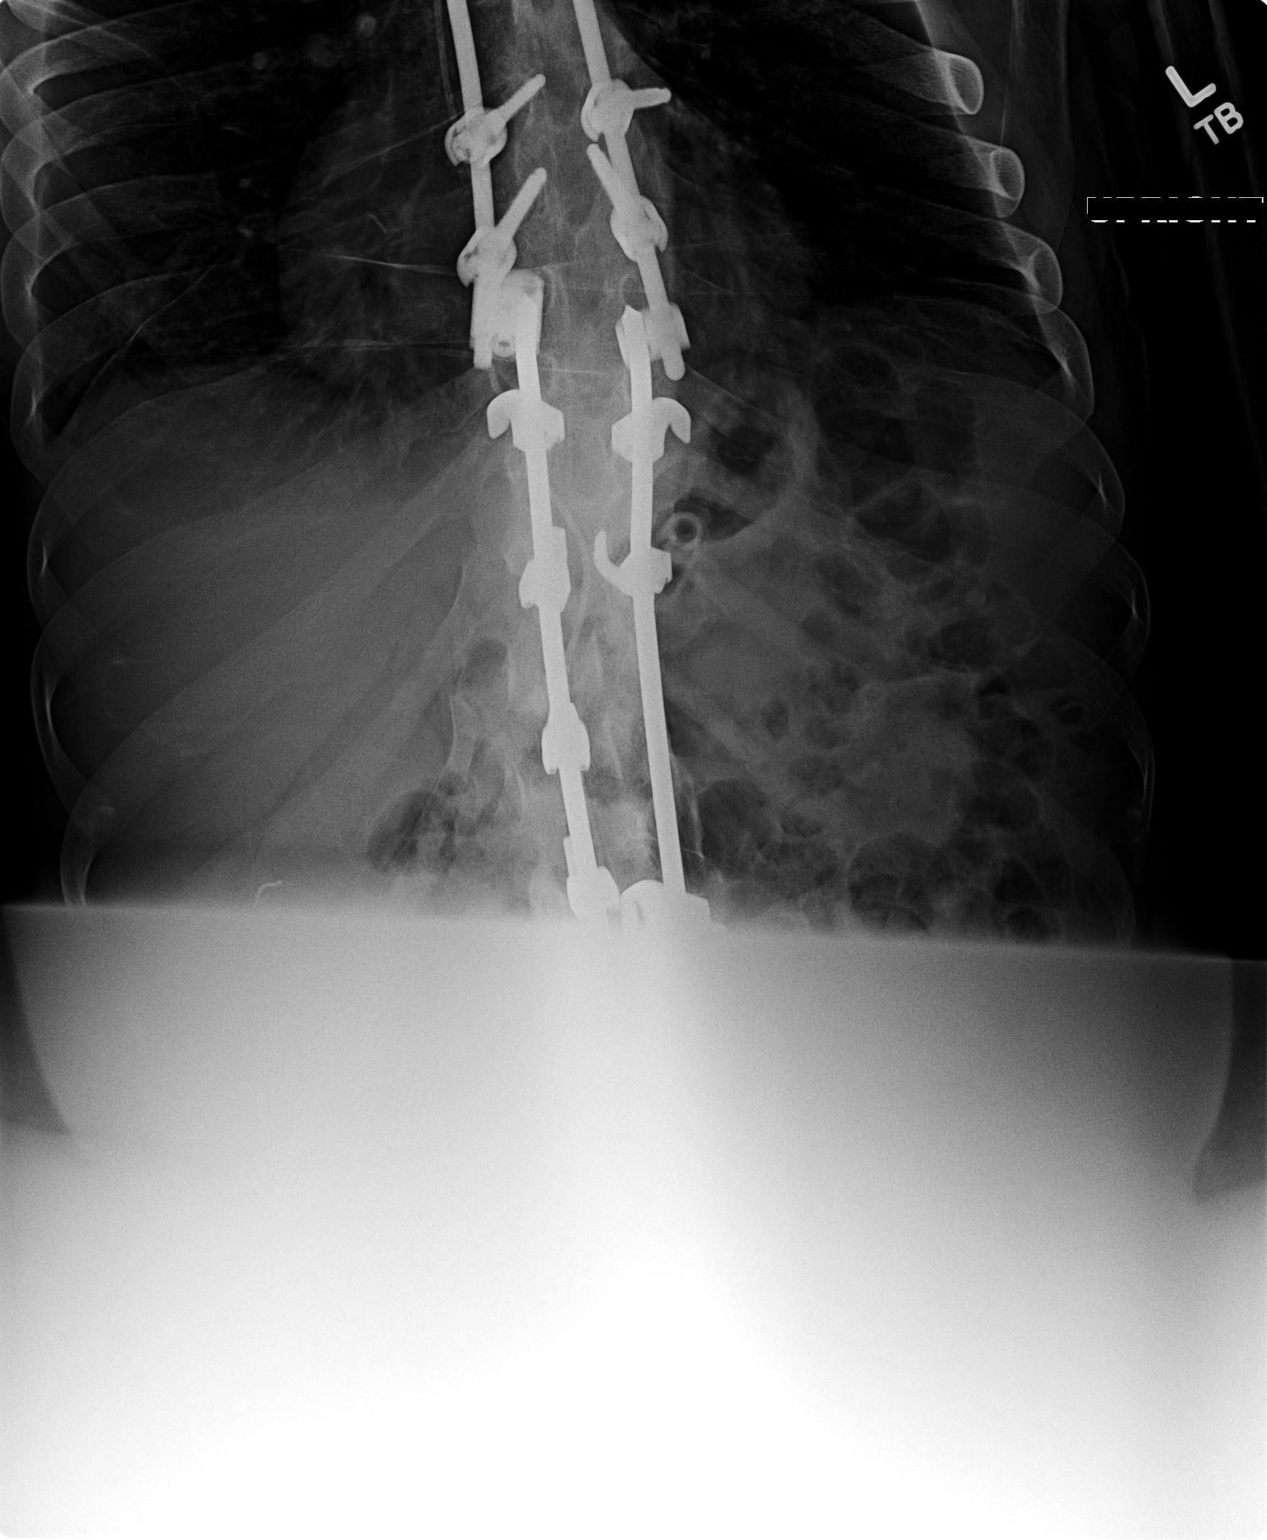

[2 of 2 positions shown; findings below may reference images not displayed]

FINDINGS: No pneumoperitoneum.  Lower lung volumes.  Stable cardiac
size and mediastinal contours.  Posterior spinal hardware again
noted. Nonobstructed bowel gas pattern.  Percutaneous enteric tube.
Tubing also projects over the pelvis. Stable visualized osseous
structures.
IMPRESSION: Nonobstructed bowel gas pattern, no free air.  Lower lung volumes.

## 2011-07-10 ENCOUNTER — Ambulatory Visit: Payer: Medicare Other

## 2011-07-13 ENCOUNTER — Ambulatory Visit: Payer: Medicare Other

## 2011-07-15 ENCOUNTER — Ambulatory Visit: Payer: Medicare Other

## 2011-07-17 ENCOUNTER — Ambulatory Visit: Payer: Medicare Other

## 2011-07-18 IMAGING — CT CT ABD-PELV W/ CM
1 of 2 series · 13 of 32 positions shown, 19 images · IV contrast (Omnipaque 300)
Comparison: Abdominal pelvic CT 02/06/2010.

CLINICAL DATA: Abdominal pain with constipation, ileus.

CT ABDOMEN AND PELVIS WITH CONTRAST
TECHNIQUE: Multidetector CT imaging of the abdomen and pelvis was
performed following the standard protocol during bolus
administration of intravenous contrast.
Contrast: 100 ml Omnipaque-TQQ intravenously.

[Series 2: abd/ pel 5mm · axial · 0.63mm/px · z∈[-534,-129]mm · 13 of 95 slices shown, 19 images]
[im 7/95  soft-tissue]
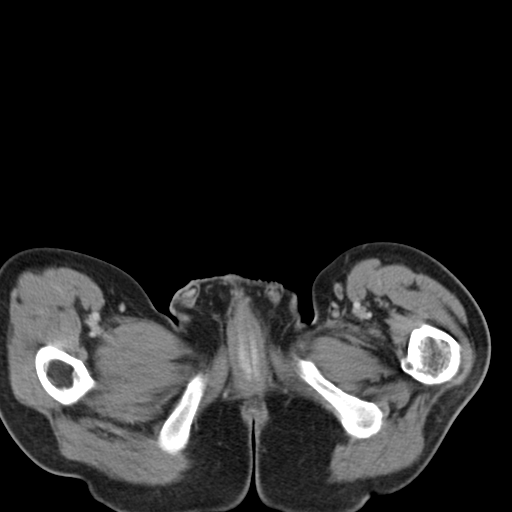
[im 7/95  bone]
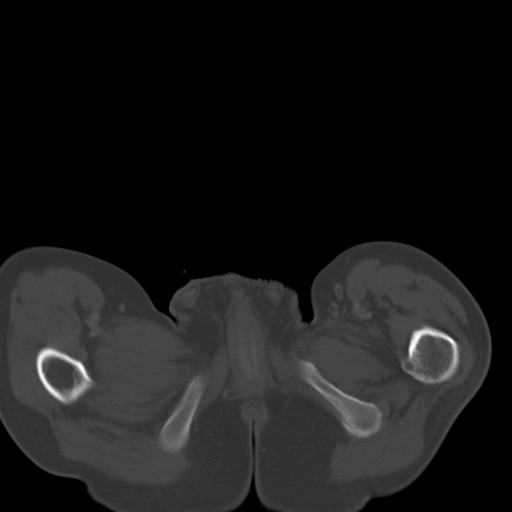
[im 13/95  soft-tissue]
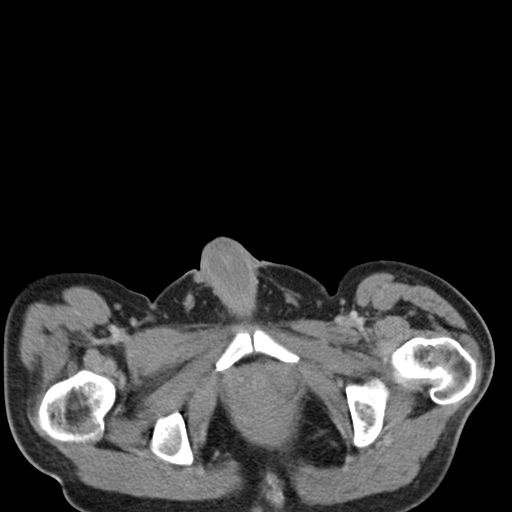
[im 19/95  soft-tissue]
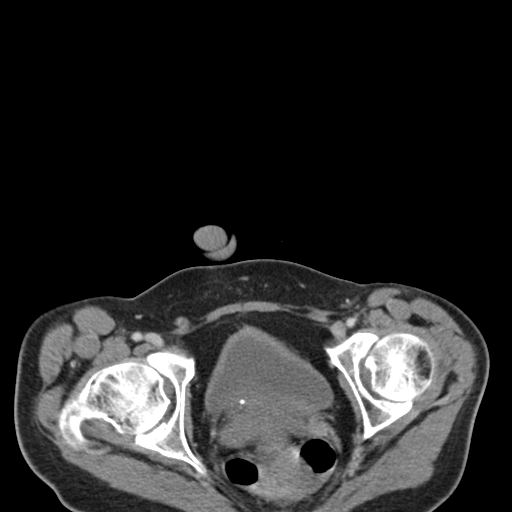
[im 26/95  soft-tissue]
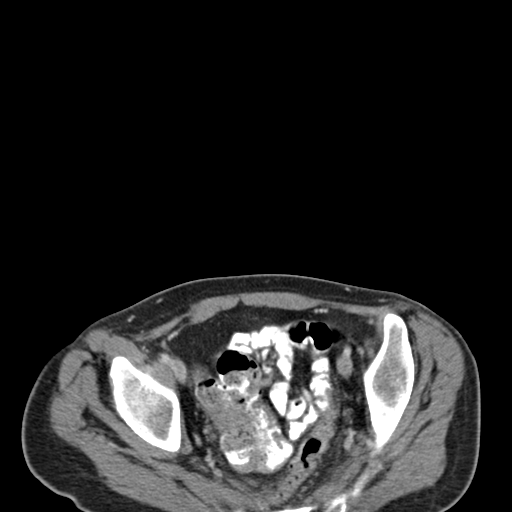
[im 32/95  soft-tissue]
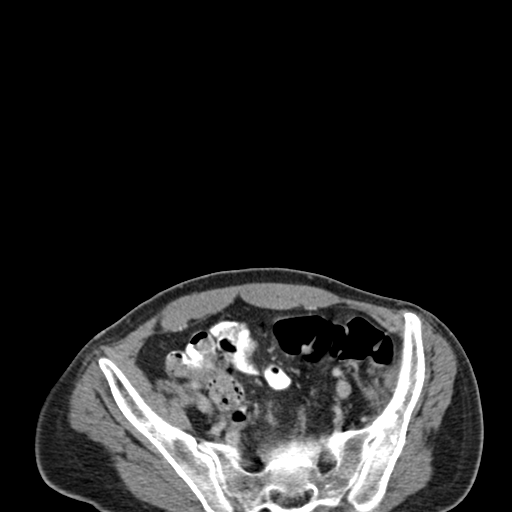
[im 38/95  soft-tissue]
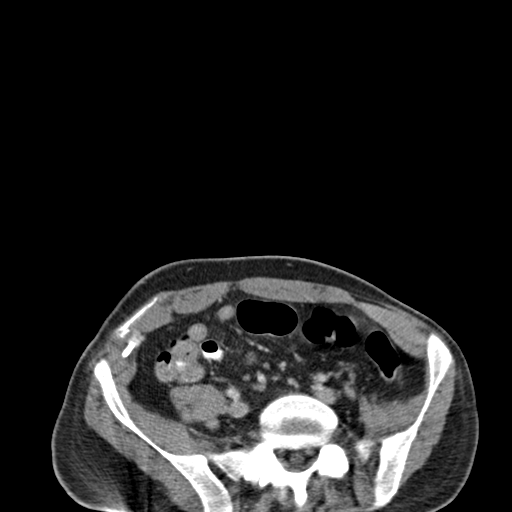
[im 51/95  soft-tissue]
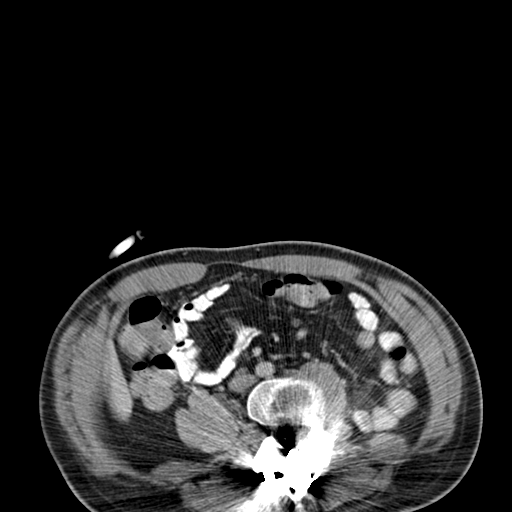
[im 57/95  soft-tissue]
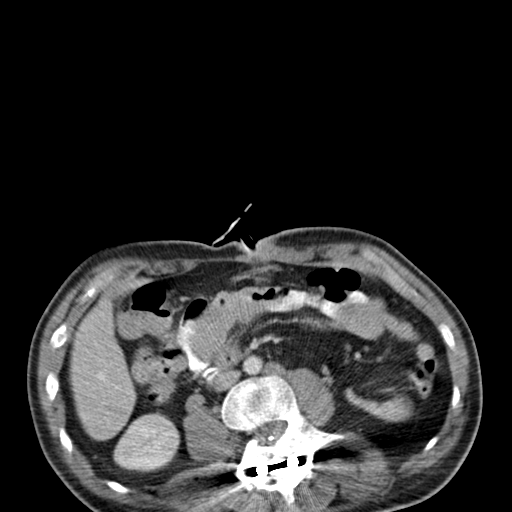
[im 63/95  soft-tissue]
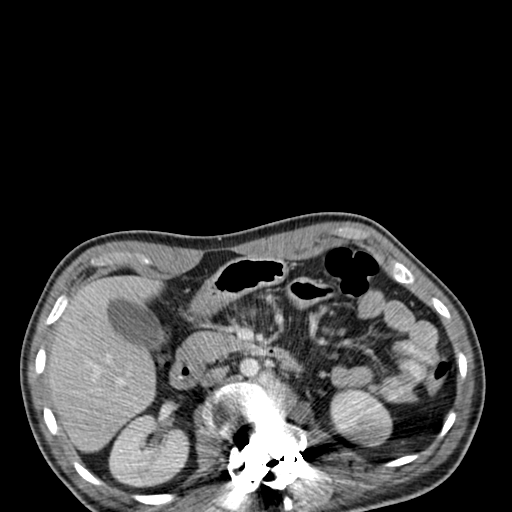
[im 63/95  bone]
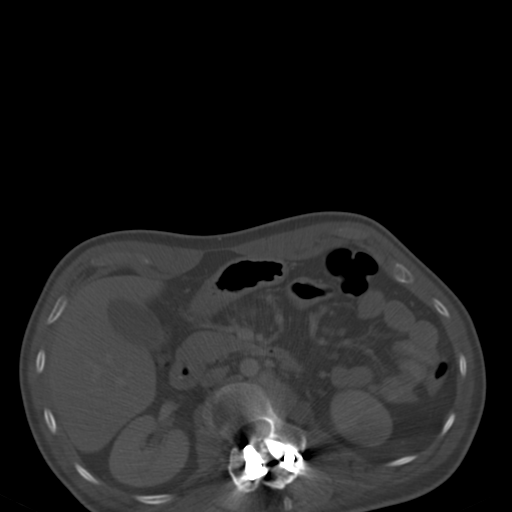
[im 69/95  soft-tissue]
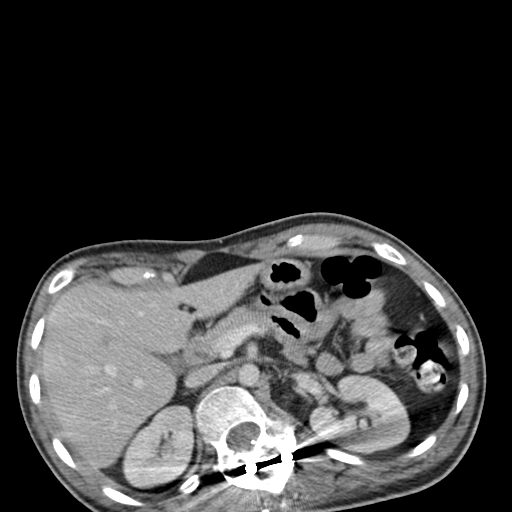
[im 69/95  lung]
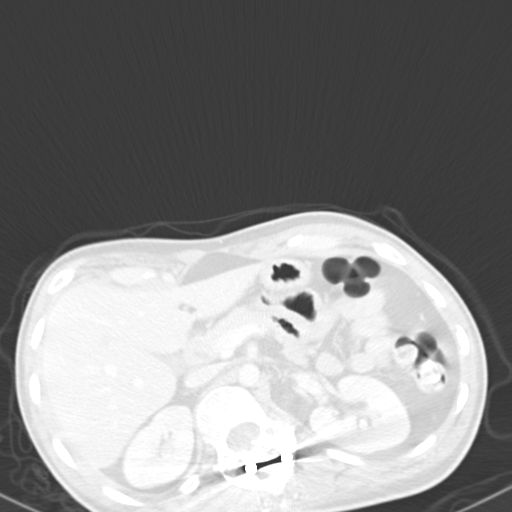
[im 76/95  soft-tissue]
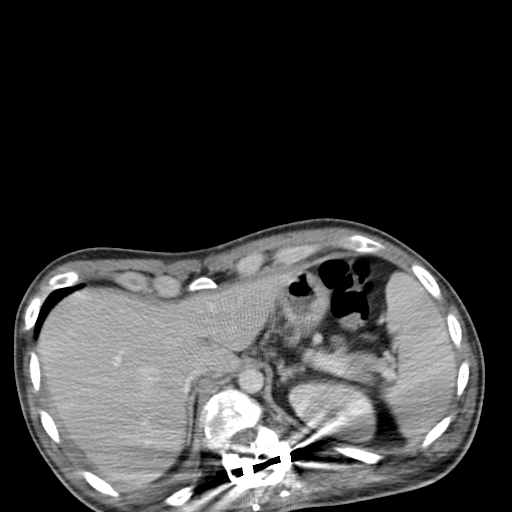
[im 76/95  lung]
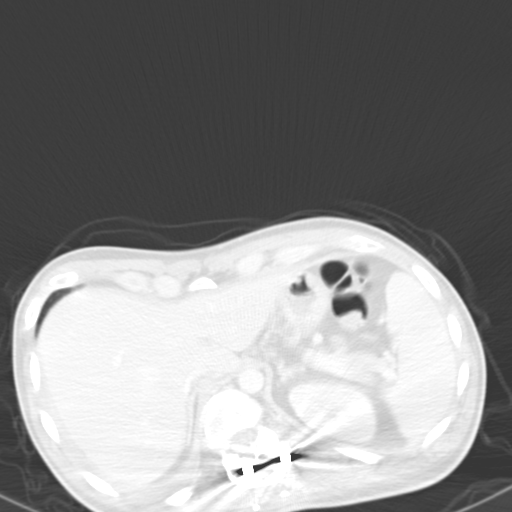
[im 82/95  soft-tissue]
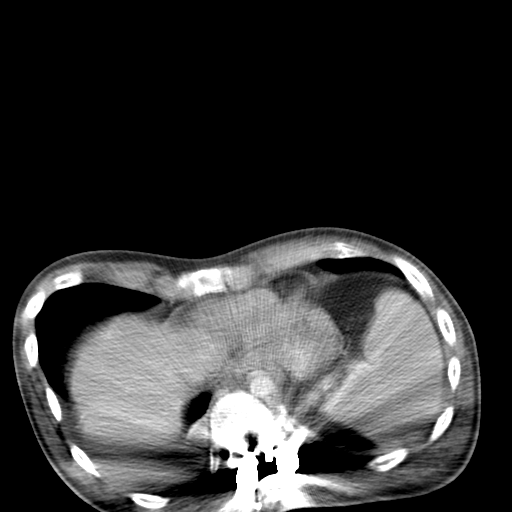
[im 82/95  lung]
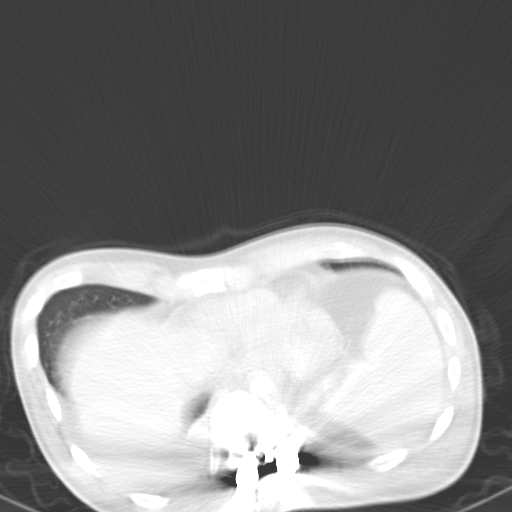
[im 88/95  soft-tissue]
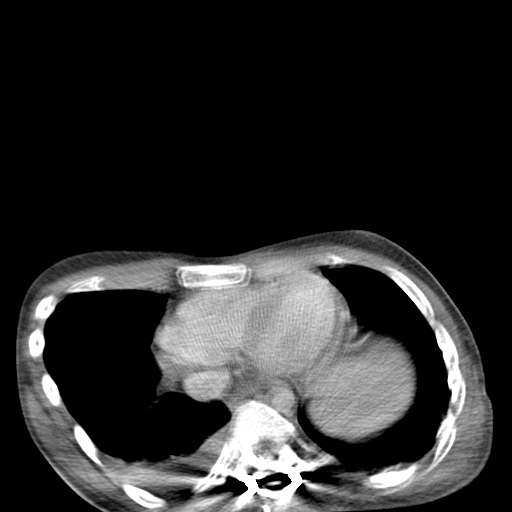
[im 88/95  lung]
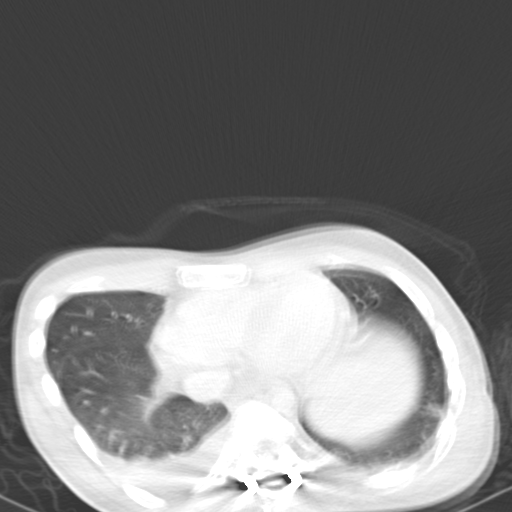

[13 of 32 positions shown; findings below may reference images not displayed]

FINDINGS: There is a new small right pleural effusion associated
with right lower lobe atelectasis.  There is a thoracolumbar
scoliosis status post Harrington rod fusion.  No acute osseous
findings are identified.

There is new partial thrombosis of the branches of the right portal
vein anteriorly (images 23 - 30).  The main portal vein is patent.
The superior mesenteric and splenic veins are patent.  There is no
biliary dilatation.  The gallbladder appears normal.

The spleen, pancreas, adrenal glands and kidneys appear normal.

The patient has a percutaneous G tube.  There is no bowel
distension or inflammatory process.  Postsurgical changes are
present within the right anterior abdominal wall.  The prostate
gland appears mildly enlarged.
IMPRESSION: 1.  Partial thrombosis of the anterior branches of the intrahepatic
right portal vein.  The main portal, superior mesenteric and
splenic veins are patent.
2.  New right pleural effusion with right basilar atelectasis.
3.  No intra-abdominal inflammatory changes or evidence of bowel
obstruction.

Findings were discussed by telephone with Dr. Nazareth at [id number] hours
on 12/12/2010.

## 2011-07-20 ENCOUNTER — Ambulatory Visit: Payer: Medicare Other | Admitting: Physical Therapy

## 2011-07-20 ENCOUNTER — Ambulatory Visit: Payer: Medicare Other

## 2011-07-22 ENCOUNTER — Ambulatory Visit: Payer: Medicare Other

## 2011-07-23 ENCOUNTER — Other Ambulatory Visit: Payer: Self-pay | Admitting: Family Medicine

## 2011-07-23 IMAGING — US US ART/VEN ABD/PELV/SCROTUM DOPPLER LTD
1 series · 13 of 25 positions shown · non-contrast
Comparison: CT dated 12/12/2010

CLINICAL DATA: Possible intrahepatic portal vein thrombosis in the
right lobe of the liver.

LIMITED ABDOMINAL ULTRASOUND
DUPLEX ULTRASOUND OF LIVER
TECHNIQUE: Sonographic evaluation of the liver and spleen was
performed.  Color and duplex Doppler ultrasound was performed to
evaluate the hepatic in-flow and out-flow vessels.

[Series 1: us art/ven abd/pelv/scrotum doppler ltd · 0.30mm/px · 13 of 57 slices shown]
[im 1/57]
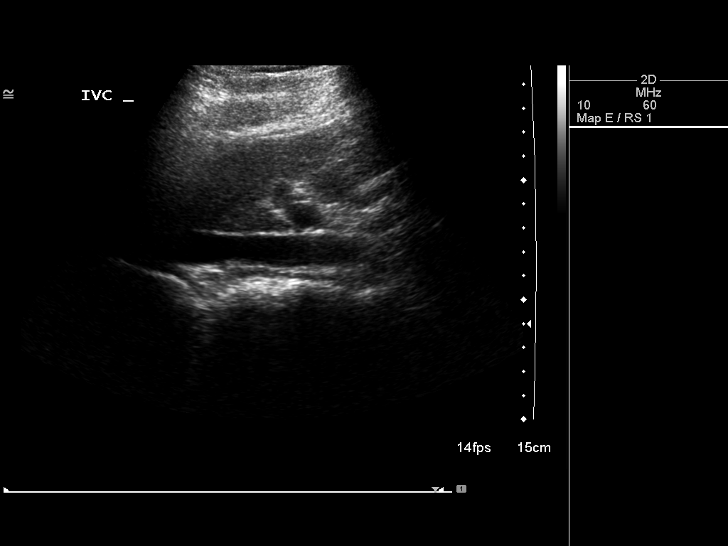
[im 5/57]
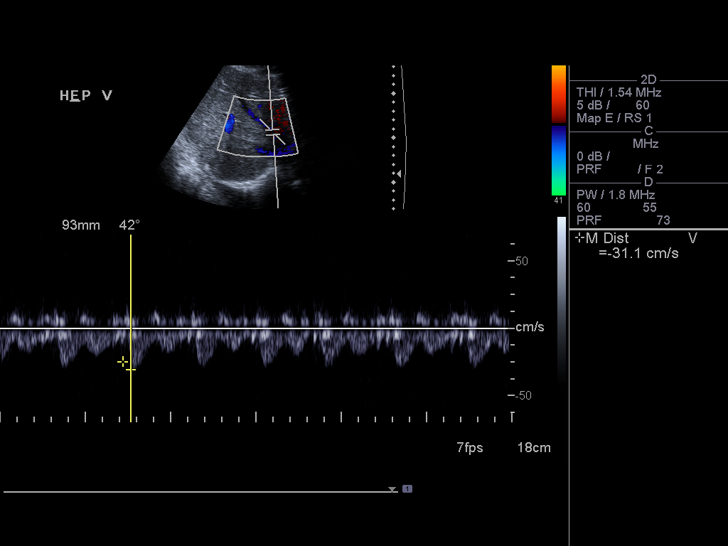
[im 10/57]
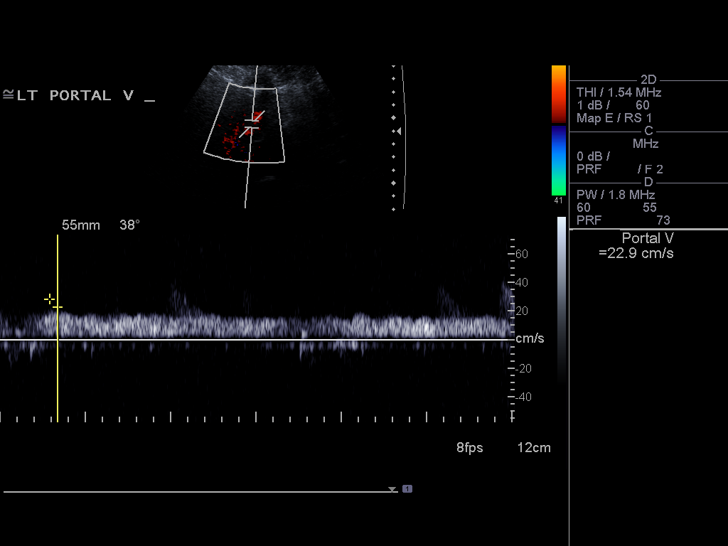
[im 15/57]
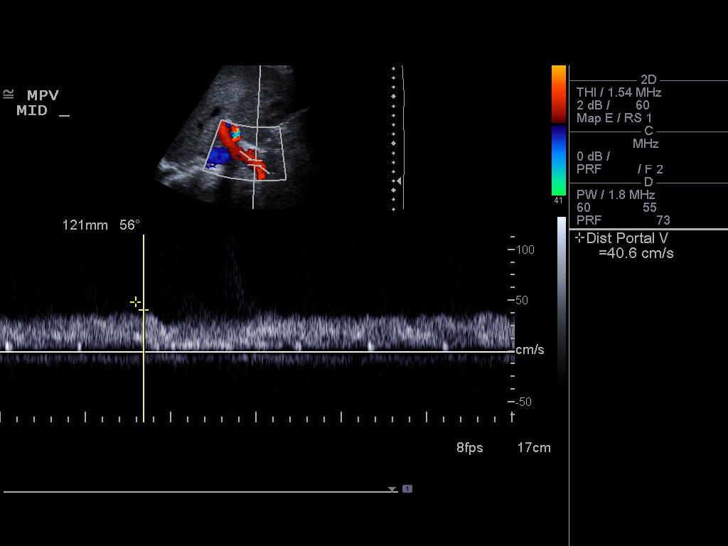
[im 19/57]
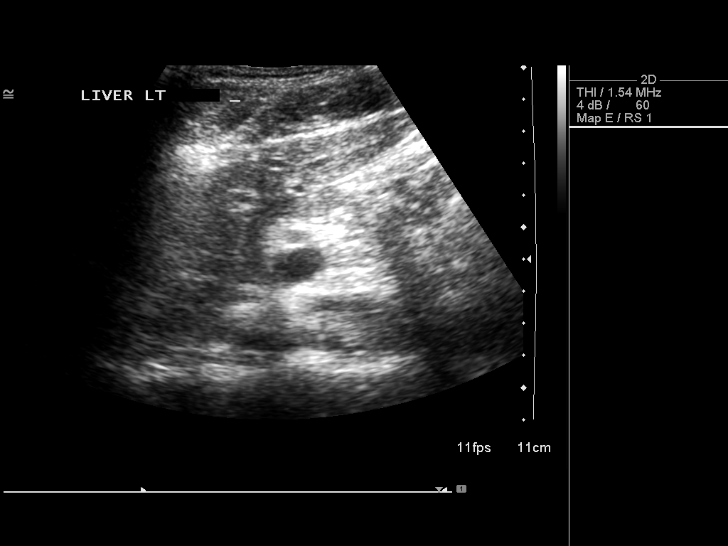
[im 24/57]
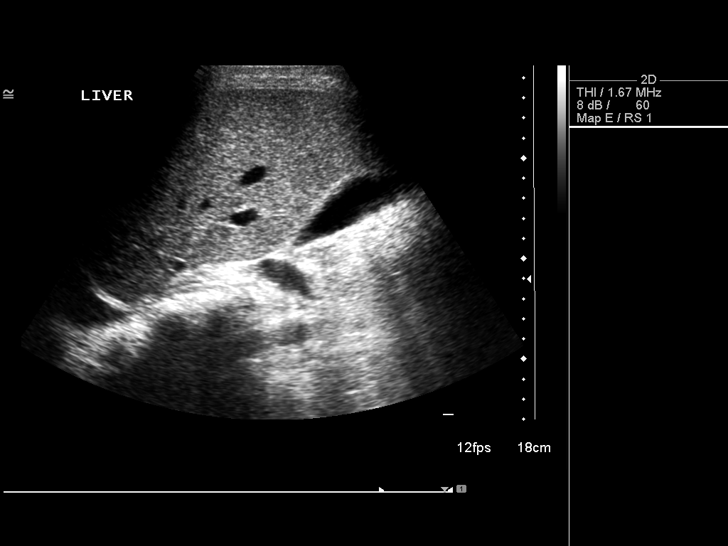
[im 29/57]
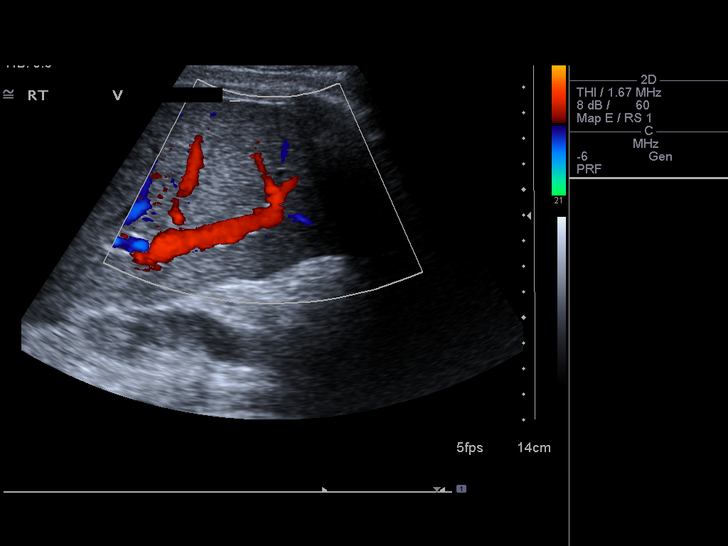
[im 33/57]
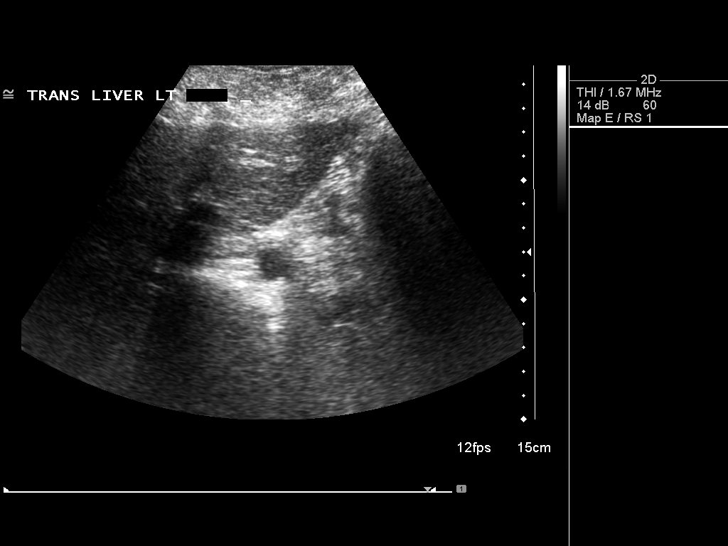
[im 38/57]
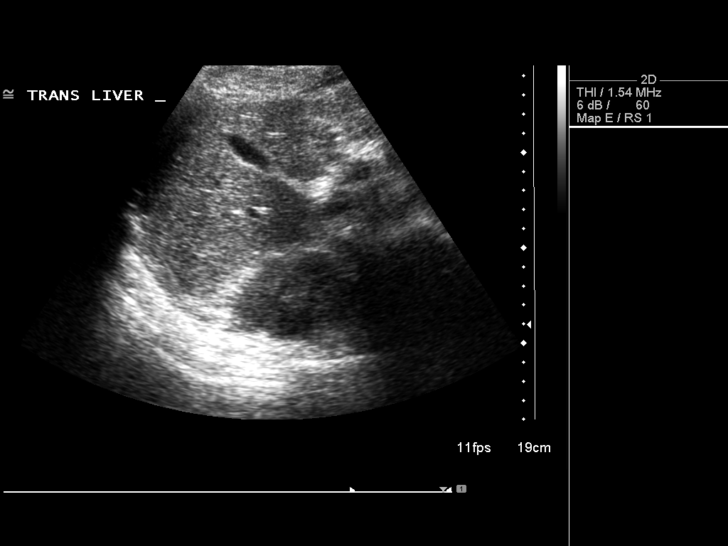
[im 43/57]
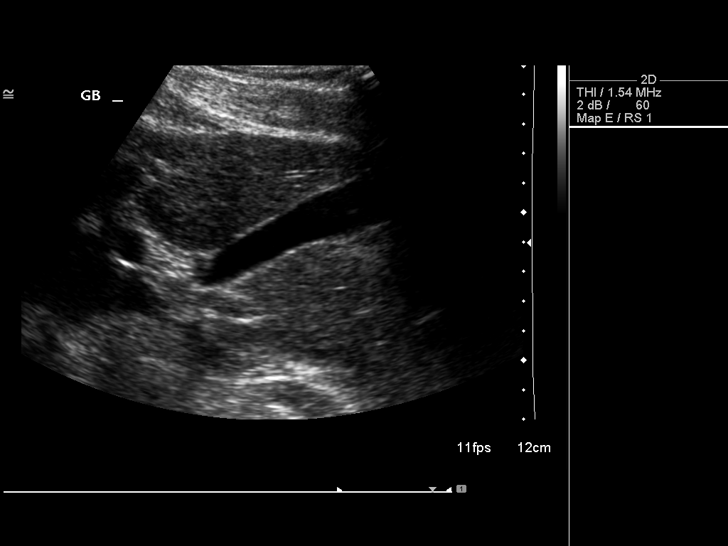
[im 47/57]
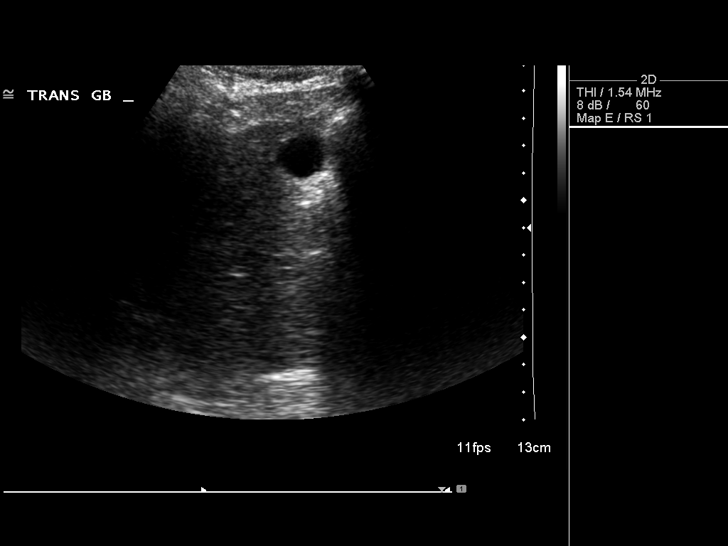
[im 52/57]
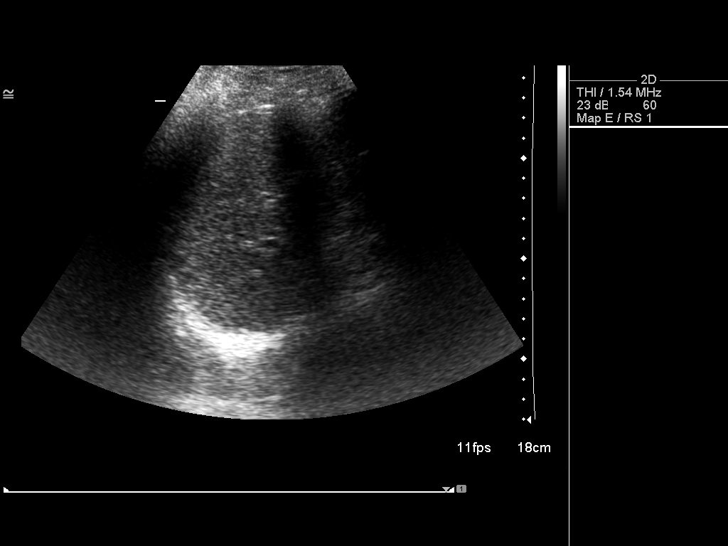
[im 57/57]
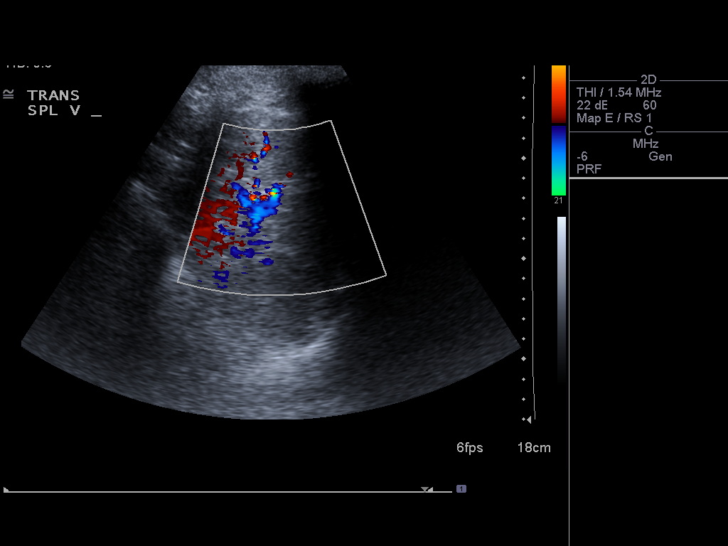

[13 of 25 positions shown; findings below may reference images not displayed]

Portal Vein Velocities:
Main:      41 - 48 cm/sec
Right:      36 cm/sec
Left:        23 cm/sec

Hepatic Vein Velocities:
Right:     55 cm/sec
Middle:  31 cm/sec
Left:       38 cm/sec

Hepatic Artery Velocity:   119 cm/sec
Splenic Vein Velocity:      21 cm/sec

Varices:   None visualized.
Ascites:   Absent.
FINDINGS: Sonographic evaluation of the liver is unremarkable
without evidence of mass or biliary ductal dilatation.  No overt
cirrhotic changes are identified.  The spleen is of normal
echotexture and size with estimated volume of 201 ml.

Portal vein patency is normal.  There is no evidence of
intrahepatic portal vein thrombosis by Doppler ultrasound.  Portal
veins demonstrate normal flow towards the liver and normal
velocities.  The intrapelvic vessels were adequately delineated.
CT findings likely are related to lack of contrast opacification
and not true thrombus.

Hepatic artery, hepatic veins and splenic vein are normally patent.
IMPRESSION: Unremarkable sonographic evaluation of the liver including a full
liver Doppler examination.  There is no evidence of portal vein
thrombosis or signs of portal hypertension.

## 2011-07-24 ENCOUNTER — Other Ambulatory Visit: Payer: Self-pay | Admitting: Family Medicine

## 2011-07-24 MED ORDER — DIAZEPAM 5 MG PO TABS: 5.0000 mg | ORAL_TABLET | ORAL | Status: DC

## 2011-07-24 NOTE — Telephone Encounter (Signed)
Faxed.   KP 

## 2011-07-24 NOTE — Telephone Encounter (Signed)
Last seen 04/07/11 and filled 01/27/11 with 5 refills . Please advise    KP

## 2011-07-27 NOTE — Telephone Encounter (Signed)
Last seen 04/07/11 and filled 04/24/11 with 2 refills. Please advise    KP

## 2011-08-21 ENCOUNTER — Other Ambulatory Visit: Payer: Self-pay | Admitting: Family Medicine

## 2011-08-21 MED ORDER — DIVALPROEX SODIUM 125 MG PO CPSP: ORAL_CAPSULE | ORAL | Status: DC

## 2011-08-21 MED ORDER — OLANZAPINE 5 MG PO TABS: 5.0000 mg | ORAL_TABLET | Freq: Every day | ORAL | Status: DC

## 2011-08-21 NOTE — Telephone Encounter (Signed)
Rx faxed.    KP 

## 2011-09-01 ENCOUNTER — Encounter: Payer: Self-pay | Admitting: Family Medicine

## 2011-09-01 ENCOUNTER — Ambulatory Visit (INDEPENDENT_AMBULATORY_CARE_PROVIDER_SITE_OTHER): Payer: Medicare Other | Admitting: Family Medicine

## 2011-09-01 VITALS — BP 110/70 | HR 76 | Temp 97.0°F | Resp 14

## 2011-09-01 DIAGNOSIS — L709 Acne, unspecified: Secondary | ICD-10-CM

## 2011-09-01 DIAGNOSIS — L708 Other acne: Secondary | ICD-10-CM

## 2011-09-01 DIAGNOSIS — Z23 Encounter for immunization: Secondary | ICD-10-CM

## 2011-09-01 IMAGING — RF DG ESOPHAGUS
11 series · 11 of 11 positions shown · non-contrast
Comparison: None.

CLINICAL DATA: Dysphagia/post kyphosis surgery November 2010.  The
patient was spoon fed.

ESOPHOGRAM/BARIUM SWALLOW
TECHNIQUE: Single contrast examination was performed using thick
barium. Also a thick barium mixed with apple sauce
Fluoroscopy time:  2.7 minutes.

[Series 1: run · 1 of 1 slices shown (1 of 11)]
[im 1/1]
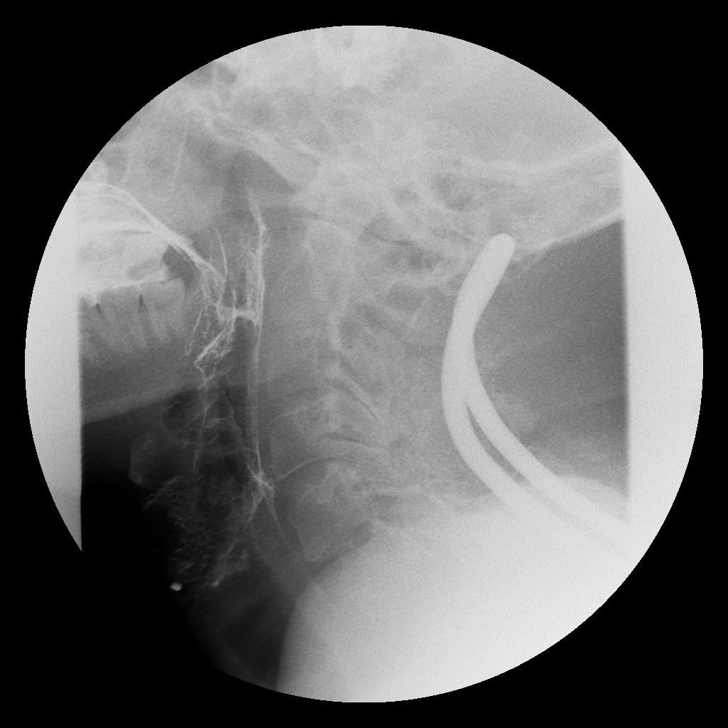

[Series 2: run · 1 of 1 slices shown (2 of 11)]
[im 1/1]
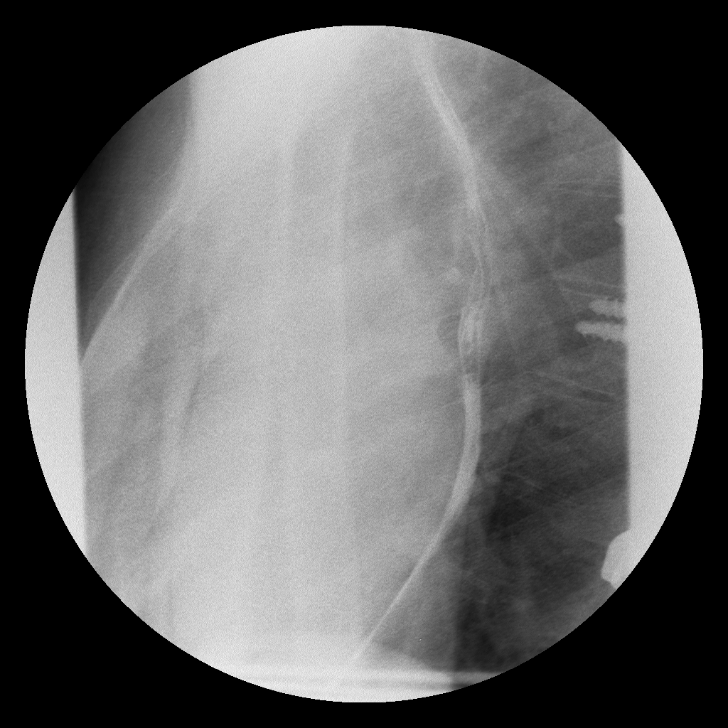

[Series 3: run · 1 of 1 slices shown (3 of 11)]
[im 1/1]
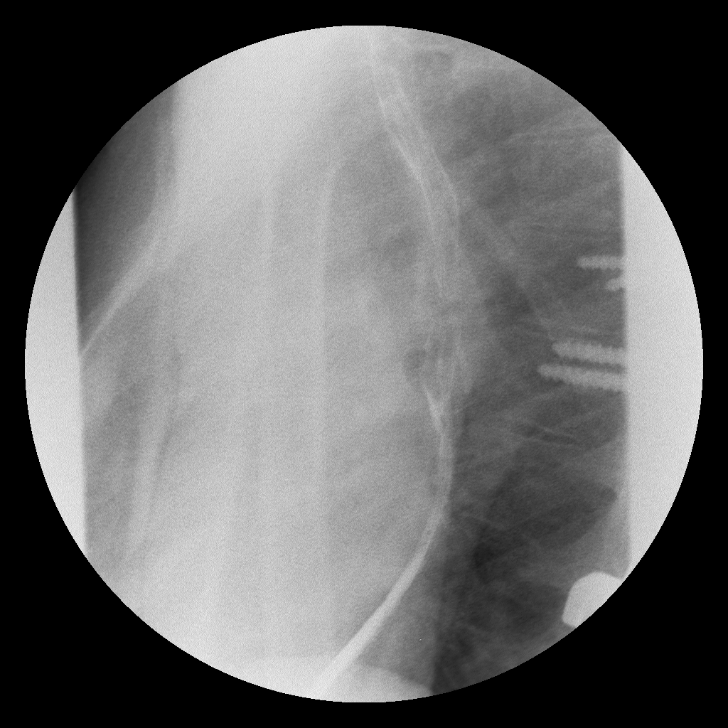

[Series 4: run · 1 of 1 slices shown (4 of 11)]
[im 1/1]
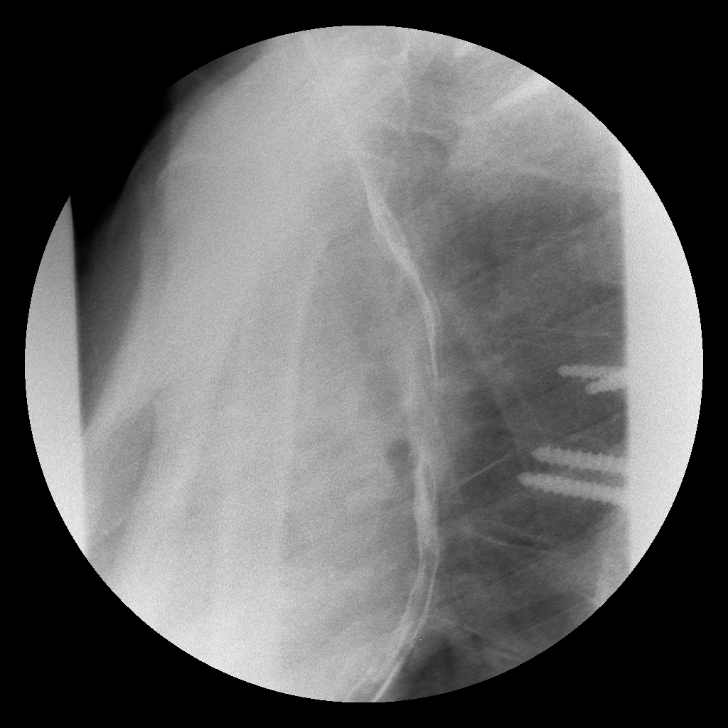

[Series 5: run · 1 of 1 slices shown (5 of 11)]
[im 1/1]
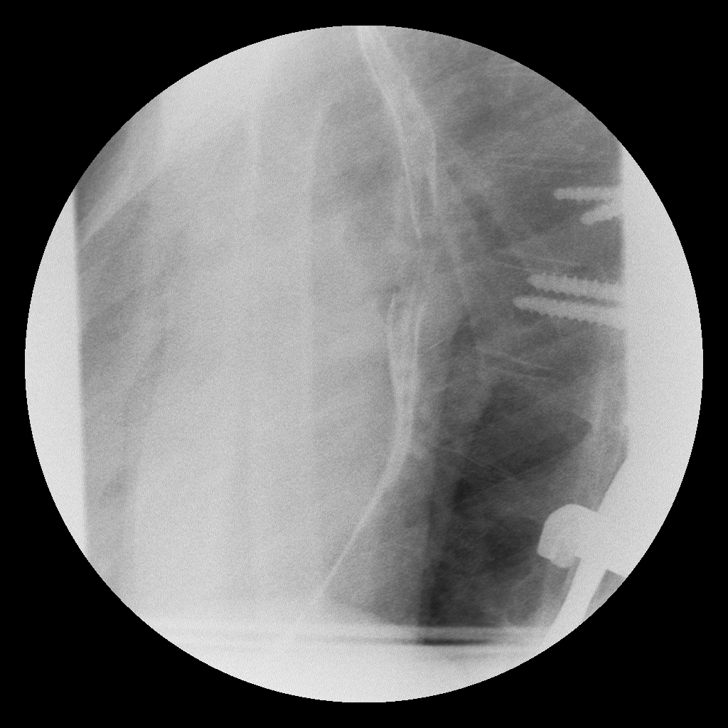

[Series 6: run · 1 of 1 slices shown (6 of 11)]
[im 1/1]
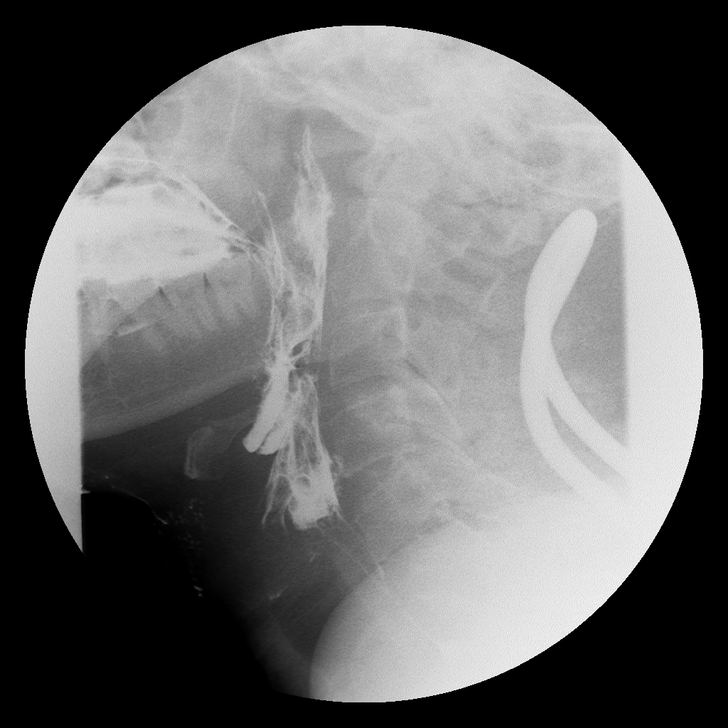

[Series 7: run · 1 of 1 slices shown (7 of 11)]
[im 1/1]
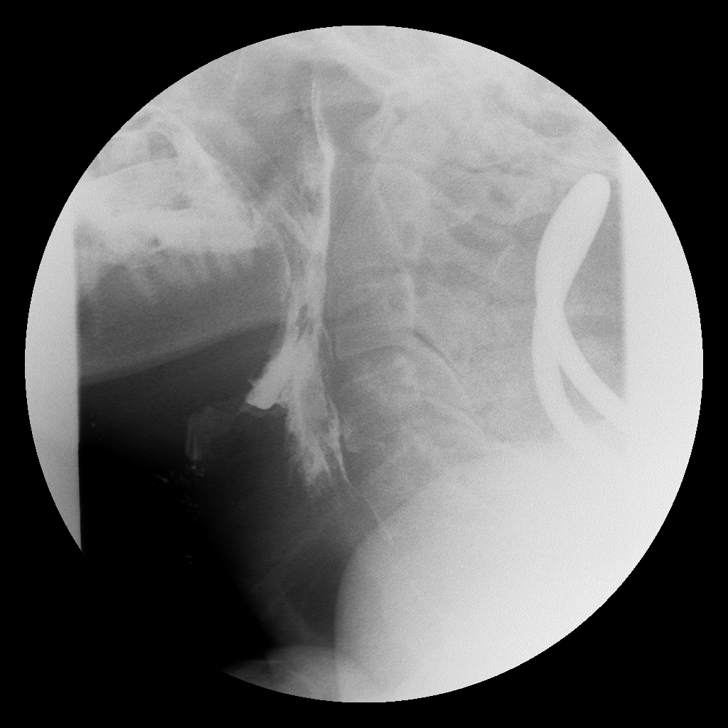

[Series 8: run · 1 of 1 slices shown (8 of 11)]
[im 1/1]
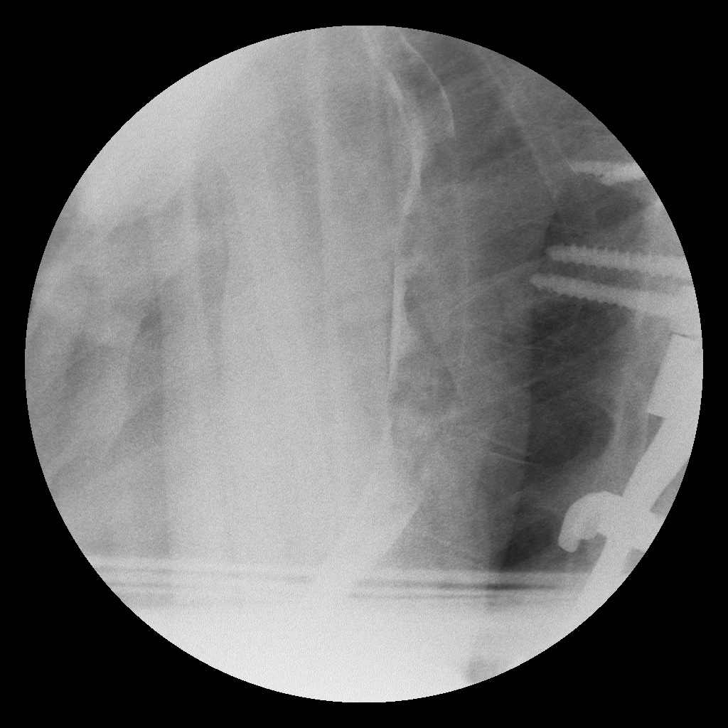

[Series 9: run · 1 of 1 slices shown (9 of 11)]
[im 1/1]
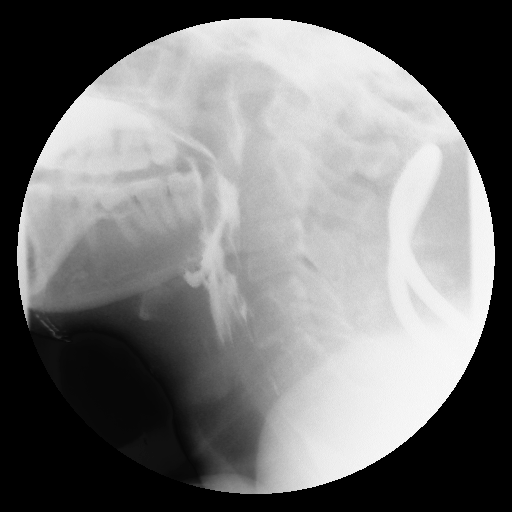

[Series 10: run · 1 of 1 slices shown (10 of 11)]
[im 1/1]
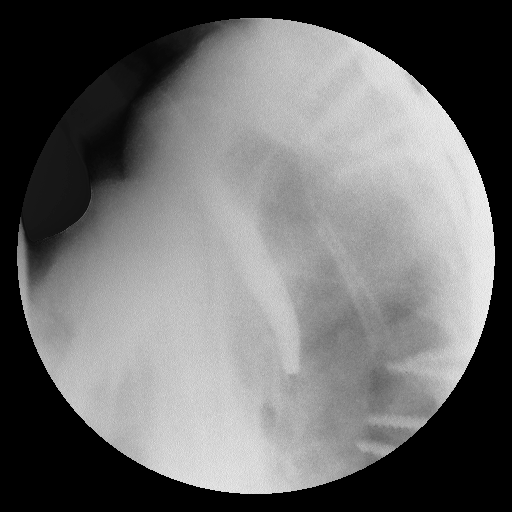

[Series 11: run · 1 of 1 slices shown (11 of 11)]
[im 1/1]
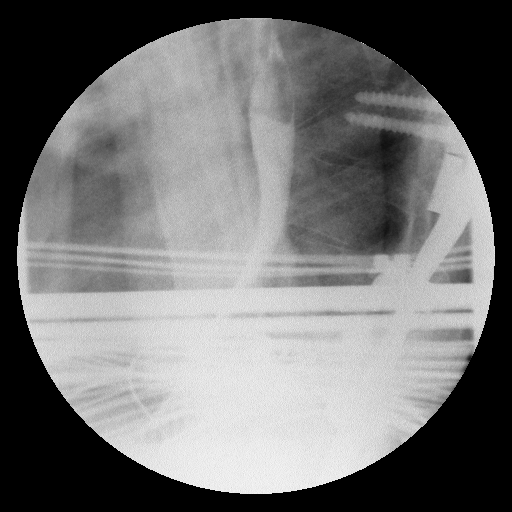

[11 of 11 positions shown; findings below may reference images not displayed]

FINDINGS: The study is limited technically.  This patient has
severe physical deformities and is wheelchair-bound.  The study was
done in a dysphagia chair, and is limited to the lateral
projection.

The patient was not fully cooperative, and would only swallow small
aliquots of barium.  We got him to swallow a bit more, by mixing of
barium with applesauce, but it is still a limited study.

There is no obvious penetration or aspiration.  No esophageal
obstruction or stricture is identified.
IMPRESSION: Limited study as described above.  No gross penetration or
aspiration.  No stricture or obstruction of the esophagus.

## 2011-09-01 MED ORDER — DOXYCYCLINE HYCLATE 100 MG PO TABS: 100.0000 mg | ORAL_TABLET | Freq: Two times a day (BID) | ORAL | Status: AC

## 2011-09-01 NOTE — Progress Notes (Signed)
  Subjective:    Patient ID: [name], male    DOB: Mar 12, 1986, 25 y.o.   MRN: [id number]  HPI Pt here with mom to have forms filled out and c/o acne.  Mom asking about accutane.  He has been off his doxy for a while.  They are using retin A but only puts cream on certain spots.     Review of Systems as above   Objective:   Physical Exam  Constitutional: He is oriented to person, place, and time. He appears well-developed and well-nourished.  Neurological: He is alert and oriented to person, place, and time.  Skin:       + cystic acne + closed comedones all over face  Psychiatric: He has a normal mood and affect. His behavior is normal. Judgment and thought content normal.          Assessment & Plan:  Acne---doxycycline 100 mg bid for 3 months then we will wean down                con't retin A but use all over

## 2011-09-01 NOTE — Patient Instructions (Signed)
Acne Acne is a common skin problem. Up to 80% of people get acne at some time, especially from ages 12 years to 24 years. Acne occurs when oil glands get blocked, become red (inflamed) or infected.  CAUSES  Hair follicles have glands that make an oily material called sebum. Acne happens when these glands get plugged with sebum and skin cells. Germs (bacteria) that are normally found in the oil glands can multiply. The main cause of acne is the change in hormones during adolescence. This causes the oil glands to get bigger and to make more sebum.  Other causes or things that can make acne worse include:  Hormone changes with women's menstrual cycles.   Oil based cosmetics and hair products.   Harshly scrubbing the skin.   Strong soaps or astringents.   Stress.   Heredity.   Hormone problems due to certain diseases.   Long or oily hair rubbing against the skin.   Some medicines.   Pressure from headbands, backpacks, or shoulder pads.   Job exposure to certain oils and chemicals.  SYMPTOMS  Acne often occurs where there are concentrations of oil glands, such as the face, neck, chest, and upper back. Symptoms often include:  Red pimples.   Whiteheads.   Blackheads.   Small pus-filled pimples (pustules).   Bigger red pimples or pustules that cause tenderness.  More severe acne can cause:  Boils.   Fluid filled swellings (cysts).   Scars.  HOME CARE INSTRUCTIONS  Acne usually disappears with time. Good skin care is the most important part of treatment:  Wash the skin gently at least twice a day and after exercise. Always wash your skin before bed.   Use mild soap.   After each wash, apply a non-oil based skin moisturizer. (Especially if you have dry skin).   Keep hair clean and off the face, and shampoo it daily.   Only use treatments prescribed by your caregiver.   Use a good sun block (SPF over 15). This is especially important when you use acne medicines.    Be patient with treatments. It can take 2 months before acne gets better.   Use cosmetics that are noncomedogenic. This means that they do not plug the oil glands.   Avoid things that make acne worse:   Leaning your chin or forehead on your hands.   Picking or squeezing pimples.  TREATMENT  There are many good treatments for acne. Some are available over-the-counter and some are available with a prescription. Treatment depends on:  The type of acne, such as red pimples or whiteheads.   How severe the acne is.  Common treatments include:  Creams and lotions that:   Prevent clogging of oil glands.   Treat or prevent infection and inflammation.   Antibiotics, put on the skin or taken as a pill.   Pills that decrease sebum production.   Birth control pills.   Special lights or lasers.   Minor surgery.   Injection of medicine into acne areas.   Chemicals that cause peeling of the skin.  SEEK MEDICAL CARE IF:   Acne is not better in 8 weeks.   Acne is worse.   A larger area of skin that is red or tender (may mean infection).  Document Released: 10/16/2000 Document Revised: 07/01/2011 Document Reviewed: 10/09/2008 [employer] Patient Information 2012 [employer]. 

## 2011-10-12 ENCOUNTER — Telehealth: Payer: Self-pay | Admitting: Internal Medicine

## 2011-10-12 DIAGNOSIS — K9423 Gastrostomy malfunction: Secondary | ICD-10-CM

## 2011-10-12 NOTE — Telephone Encounter (Signed)
Spoke with patient's mother. She states he had his gastrostomy tube replaced on 06/22/11 at IR(20 French balloon retention) and it has been working well until recently. She had a difficult time with it clogging up with his medications this weekend. States she had to push hard to get it working and is concerned she heard air when she pushed.  It is working currently but she would like to have it replaced. Please, advise if he needs OV prior to scheduling change or just schedule a change of tube.

## 2011-10-12 NOTE — Telephone Encounter (Signed)
He can be scheduled directly at [facility] for replacement

## 2011-10-13 ENCOUNTER — Other Ambulatory Visit: Payer: Self-pay | Admitting: *Deleted

## 2011-10-13 NOTE — Telephone Encounter (Signed)
Scheduled with Noreene Larsson at [facility] endo on 10/21/11 11:00 AM. NPO after midnight. Booking number [id number]. [name] aware of procedure date and instructions.

## 2011-10-21 ENCOUNTER — Other Ambulatory Visit: Payer: Self-pay | Admitting: Internal Medicine

## 2011-10-21 ENCOUNTER — Ambulatory Visit (HOSPITAL_COMMUNITY)
Admission: RE | Admit: 2011-10-21 | Discharge: 2011-10-21 | Disposition: A | Discharge status: Home / Self Care | Payer: Medicare Other | Source: Ambulatory Visit | Attending: Internal Medicine | Admitting: Internal Medicine

## 2011-10-21 ENCOUNTER — Encounter (HOSPITAL_COMMUNITY): Payer: Self-pay | Admitting: Internal Medicine

## 2011-10-21 ENCOUNTER — Other Ambulatory Visit: Payer: Self-pay | Admitting: Family Medicine

## 2011-10-21 ENCOUNTER — Encounter (HOSPITAL_COMMUNITY): Admission: RE | Disposition: A | Discharge status: Home / Self Care | Payer: Self-pay | Source: Ambulatory Visit

## 2011-10-21 DIAGNOSIS — K9423 Gastrostomy malfunction: Secondary | ICD-10-CM | POA: Insufficient documentation

## 2011-10-21 DIAGNOSIS — Y833 Surgical operation with formation of external stoma as the cause of abnormal reaction of the patient, or of later complication, without mention of misadventure at the time of the procedure: Secondary | ICD-10-CM | POA: Insufficient documentation

## 2011-10-21 DIAGNOSIS — R633 Feeding difficulties, unspecified: Secondary | ICD-10-CM

## 2011-10-21 DIAGNOSIS — T17998A Other foreign object in respiratory tract, part unspecified causing other injury, initial encounter: Secondary | ICD-10-CM

## 2011-10-21 DIAGNOSIS — R1319 Other dysphagia: Secondary | ICD-10-CM

## 2011-10-21 HISTORY — PX: PEG PLACEMENT: SHX5437

## 2011-10-21 SURGERY — REPLACEMENT, PEG TUBE, WITHOUT ENDOSCOPY
Anesthesia: Topical

## 2011-10-21 MED ORDER — PAROXETINE HCL 30 MG PO TABS: 30.0000 mg | ORAL_TABLET | Freq: Every day | ORAL | Status: DC

## 2011-10-21 MED ORDER — DIVALPROEX SODIUM 125 MG PO CPSP: ORAL_CAPSULE | ORAL | Status: DC

## 2011-10-21 NOTE — Brief Op Note (Signed)
10/21/2011  12:26 PM  PATIENT:  [name]  25 y.o. male  Procedure Note: change of PEG from 42F replacement gastrostomy to 9 F replacement gastrostomy. 6 cc sterile saline  Placed in the retention balloon. Tolerated well

## 2011-10-21 NOTE — Progress Notes (Signed)
[name] has been advised of time and date as well as location of modified barium swallow. Per Dr Regino Schultze recommendations, I have asked that patient had mbs completed with neck in "corrected" position and another mbs with neck in a tilted position as his mother does when patient is fed. 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

## 2011-10-21 NOTE — H&P (Signed)
  25 yo male with CP, dependent on PEG tube feedings, malfunctioning PEG, he is hetre for change of gastrostomy

## 2011-10-21 NOTE — Telephone Encounter (Signed)
Last seen 09/01/11 and filled Depakote 08/21/11 # 180 with 2 refills and paxil 07/24/11 # 30 with 2 refills. Please advise    KP

## 2011-10-22 ENCOUNTER — Other Ambulatory Visit (HOSPITAL_COMMUNITY): Payer: Self-pay | Admitting: Internal Medicine

## 2011-10-22 ENCOUNTER — Encounter (HOSPITAL_COMMUNITY): Payer: Self-pay | Admitting: Internal Medicine

## 2011-10-22 DIAGNOSIS — G809 Cerebral palsy, unspecified: Secondary | ICD-10-CM

## 2011-10-28 ENCOUNTER — Other Ambulatory Visit (HOSPITAL_COMMUNITY): Payer: Medicare Other

## 2011-10-28 ENCOUNTER — Ambulatory Visit (HOSPITAL_COMMUNITY): Payer: Medicare Other

## 2011-10-30 ENCOUNTER — Telehealth: Payer: Self-pay | Admitting: Internal Medicine

## 2011-10-30 NOTE — Telephone Encounter (Signed)
I don't have any problems with using gauze around the PEG, I think the nurse told her. I will talk to her about the PEG

## 2011-10-30 NOTE — Telephone Encounter (Signed)
Spoke with patient's mother. She states she was told to not use a gauze pad around the PEG tube site but states he always has a dark color drainage around it and she has to use gauze. Also states the medications/ water sometimes come out the tube which is new with this tube. She wants to schedule with Dr. Juanda Chance to discuss tube issues. Scheduled on 11/17/11 at 2:30 PM

## 2011-11-13 ENCOUNTER — Telehealth: Payer: Self-pay | Admitting: Internal Medicine

## 2011-11-13 NOTE — Telephone Encounter (Signed)
Spoke with patient's mother. She is in [facility] with bilateral pneumonia. She wants to reschedule patients appointment. Rescheduled to 12/09/11 at 2:15 PM. She states she has had to r/s the swallowing study also because her brother died unexpectedly right after Christmas. She will have the patient get the study prior to the OV.

## 2011-11-17 ENCOUNTER — Ambulatory Visit: Payer: Medicare Other | Admitting: Internal Medicine

## 2011-11-19 ENCOUNTER — Other Ambulatory Visit (HOSPITAL_COMMUNITY): Payer: Self-pay | Admitting: *Deleted

## 2011-11-19 DIAGNOSIS — T17998A Other foreign object in respiratory tract, part unspecified causing other injury, initial encounter: Secondary | ICD-10-CM

## 2011-11-19 DIAGNOSIS — G809 Cerebral palsy, unspecified: Secondary | ICD-10-CM

## 2011-11-25 IMAGING — CR DG LUMBAR SPINE 2-3V
1 series · 1 of 1 positions shown · non-contrast
Comparison: Lumbar spine films 12/03/2010.

CLINICAL DATA: Infantile cerebral palsy with scoliosis.  Status
post instrumentation.  Recent fall from wheelchair.

LUMBAR SPINE - 2-3 VIEW

[view not recorded]
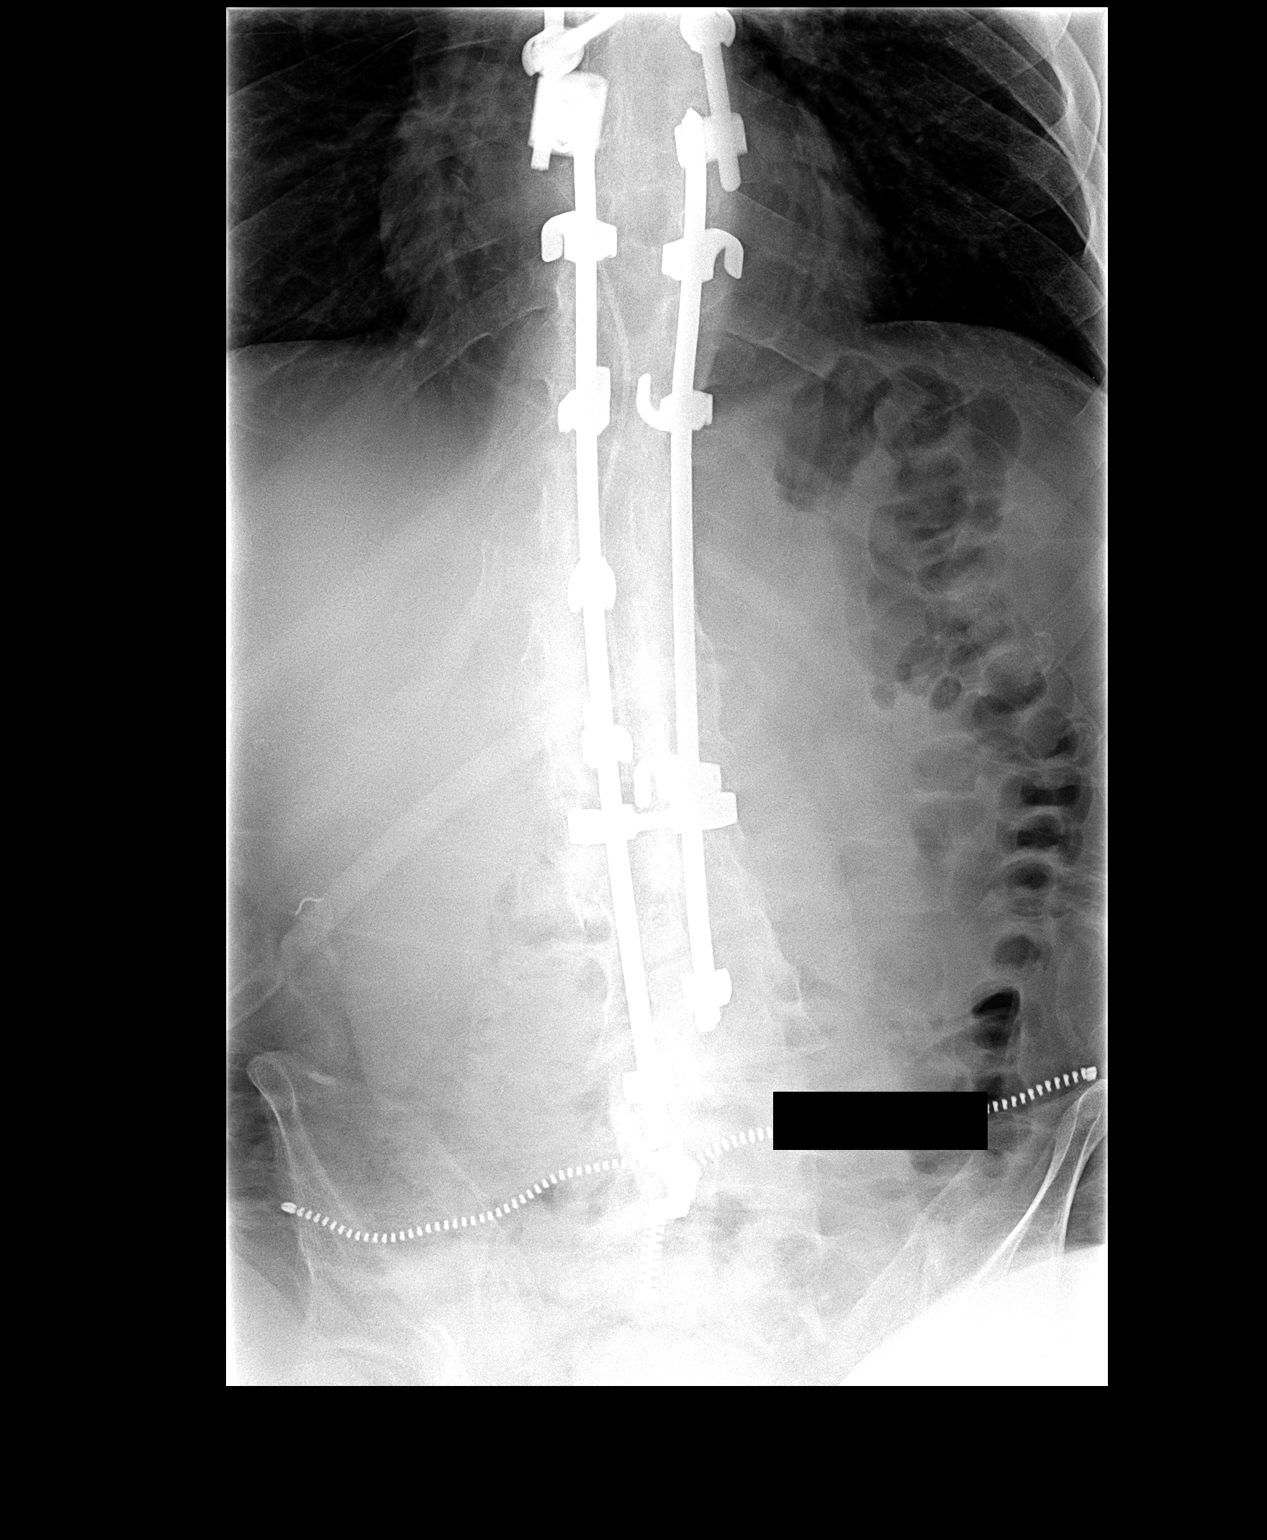

[1 of 1 positions shown; findings below may reference images not displayed]

FINDINGS: Hardware grossly intact with Harrington rods extending
down to L4 on the right and L3 on the left.  No visible post
traumatic deformity.  No apparent change from priors. Shunt tubing
in the posterior soft tissues of uncertain clinical significance.
IMPRESSION: Stable exam.

## 2011-11-25 IMAGING — CR DG CERVICAL SPINE 2 OR 3 VIEWS
1 series · 1 of 1 positions shown · non-contrast
Comparison: None.

CLINICAL DATA: Infantile cerebral palsy, post cervical thoracic and
lumbar instrumentation.  Recent fall from wheelchair.

CERVICAL SPINE - 2-3 VIEW

[w c-spine lat]
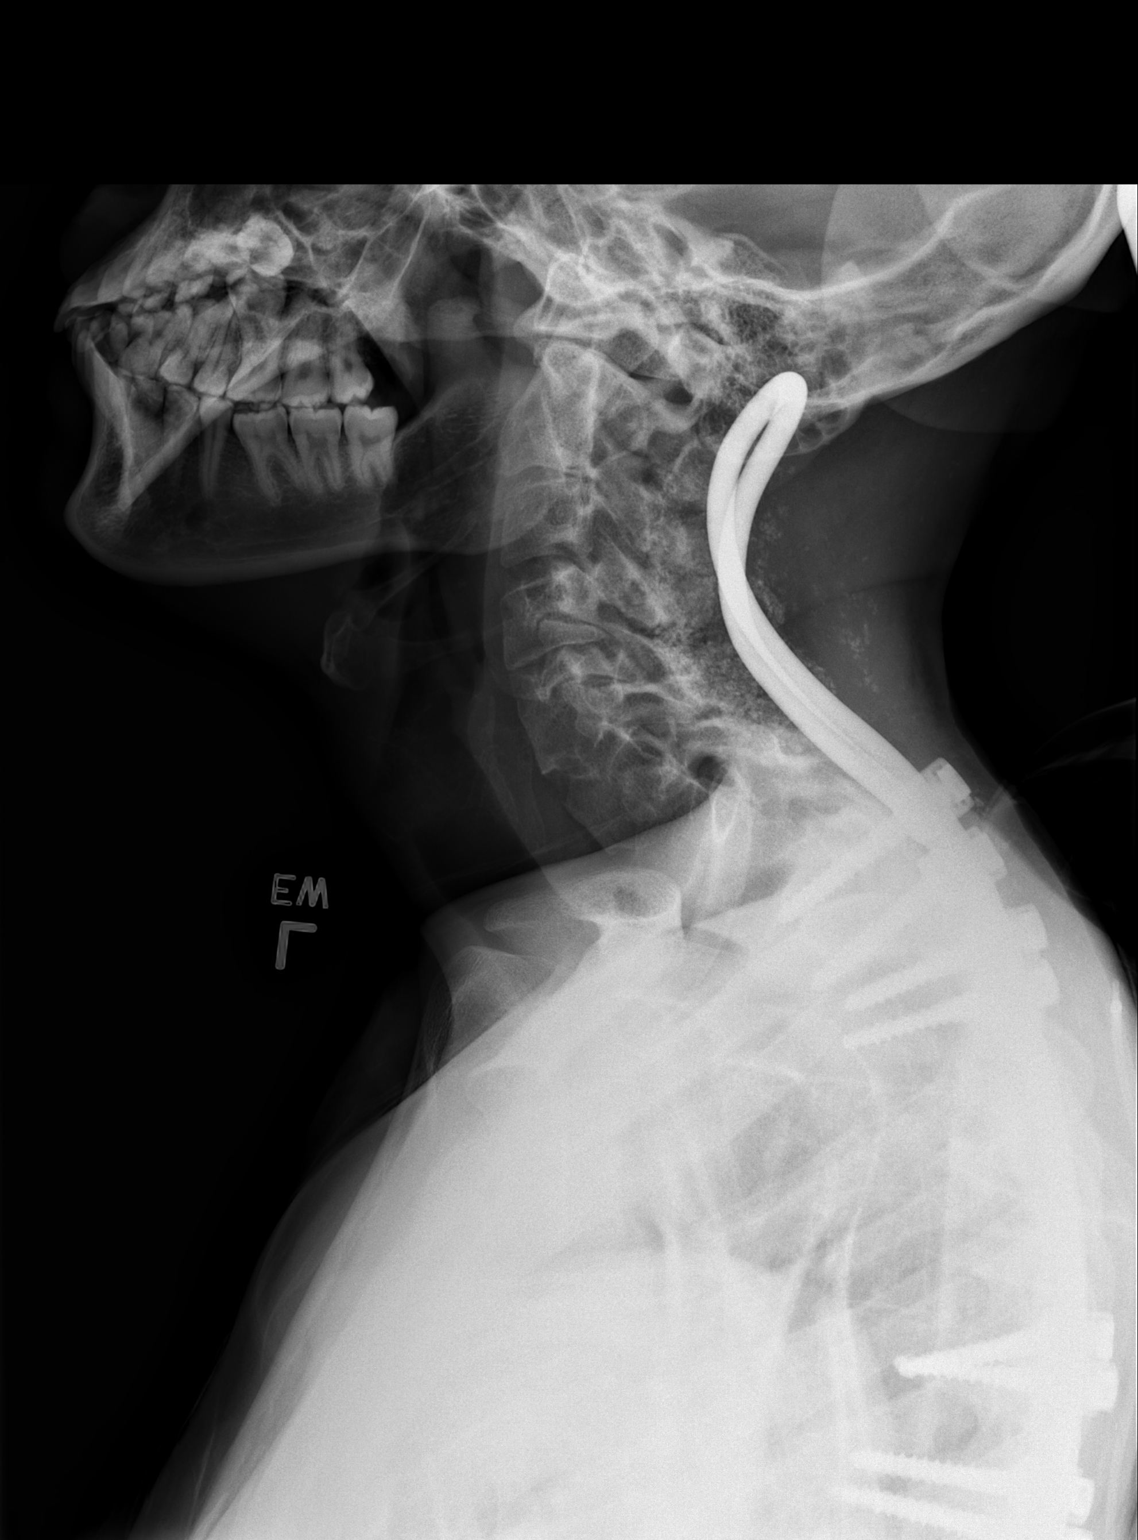

[1 of 1 positions shown; findings below may reference images not displayed]

FINDINGS: The patient is status post posterior curved rod
instrumentation for infantile scoliosis.  There is no visible
cervical spine fracture or traumatic subluxation based on these
available views.  Integrity of the fusion is not established with
plain films, but there does appear to be flocculent bone overlying
the posterior elements.
IMPRESSION: No visible fracture or post traumatic subluxation.  See comments
above.

## 2011-11-25 IMAGING — CR DG THORACIC SPINE 2V
1 series · 1 of 1 positions shown · non-contrast
Comparison: Prior chest x-rays, most recent 11/30/2010.

CLINICAL DATA: Infantile cerebral palsy with scoliosis.  The
patient's fluid out of wheelchair.

THORACIC SPINE - 2 VIEW

[w t-spine lat *]
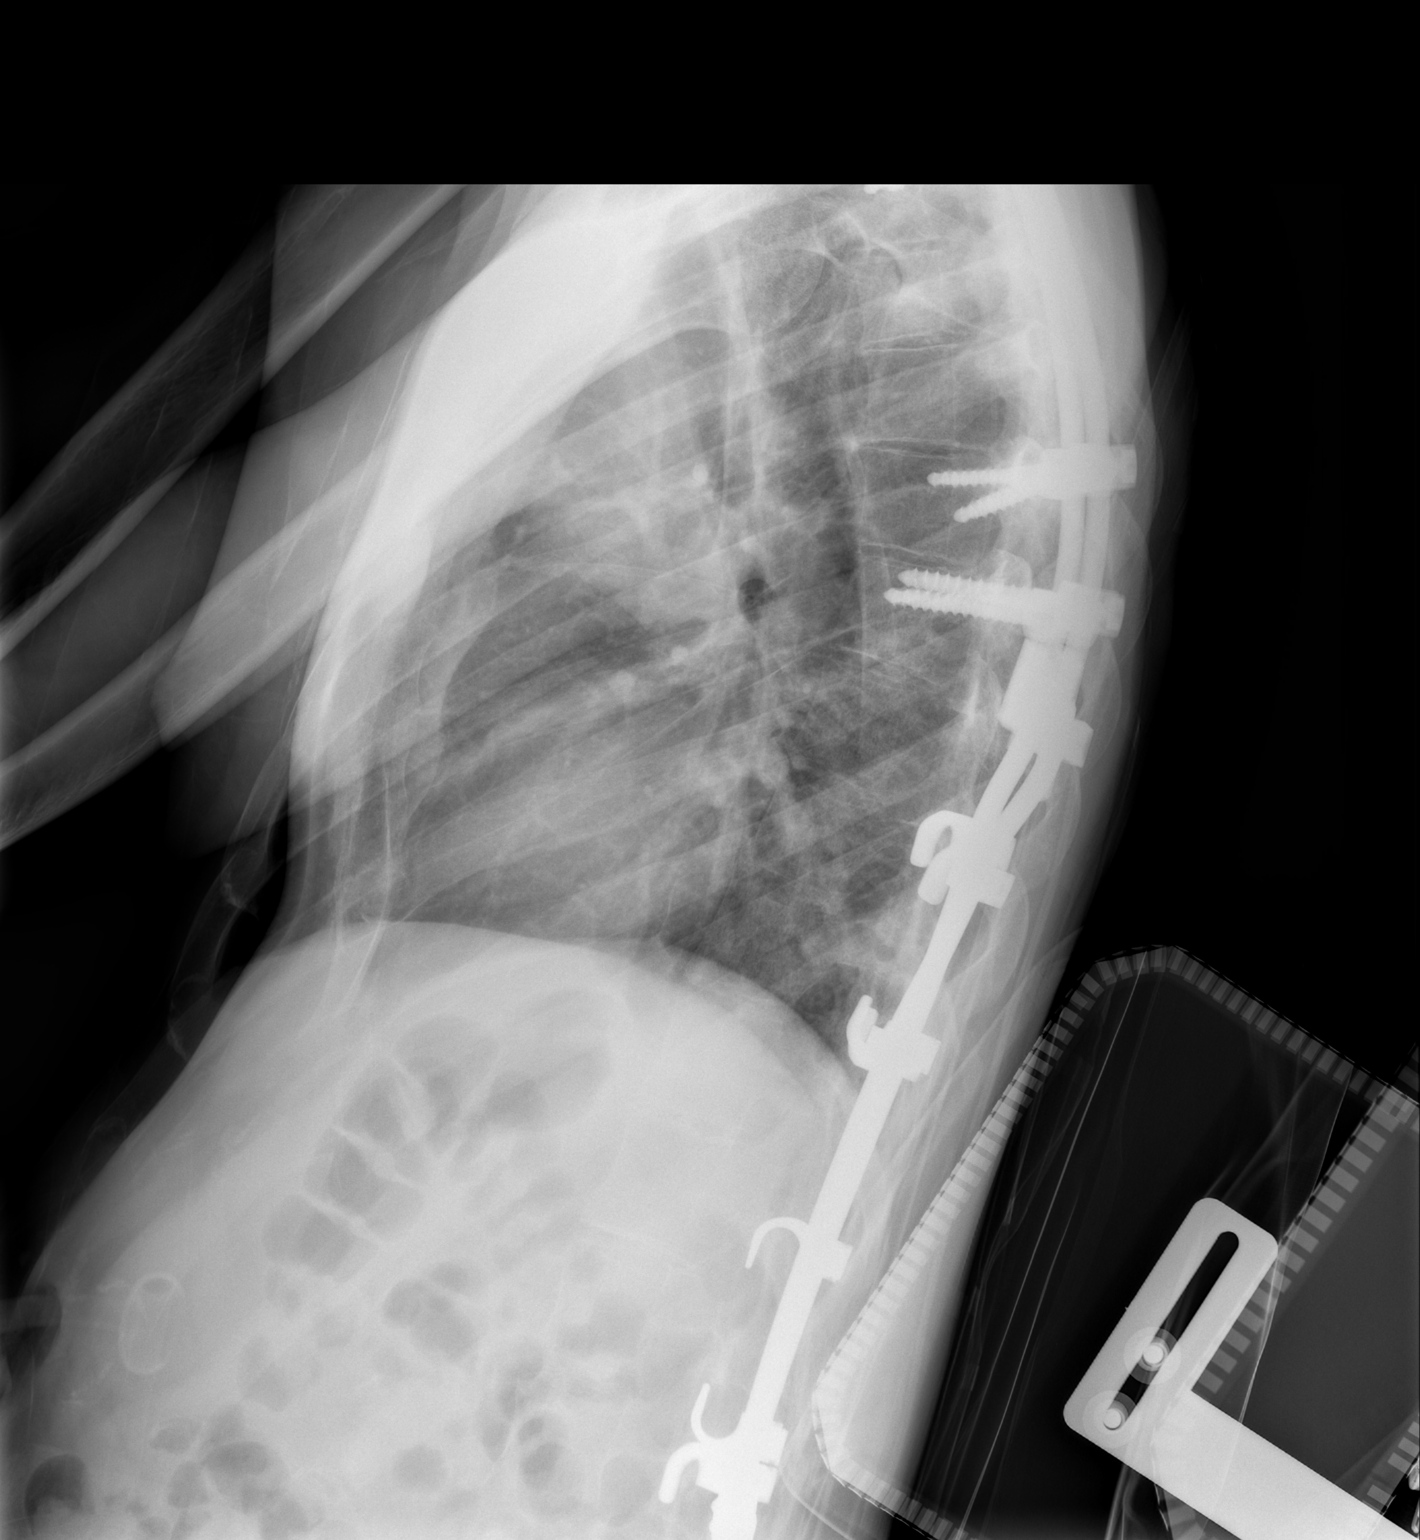

[1 of 1 positions shown; findings below may reference images not displayed]

FINDINGS: Anatomic alignment.  Hardware grossly intact. No adverse
osseous features.  Indwelling central venous catheter appears to
have been truncated in the supraclavicular region, possibly old
ventricular atrial shunt.
IMPRESSION: As above.  No visible hardware failure or post traumatic deformity.

## 2011-12-01 ENCOUNTER — Encounter: Payer: Self-pay | Admitting: Family Medicine

## 2011-12-02 ENCOUNTER — Ambulatory Visit (HOSPITAL_COMMUNITY)
Admission: RE | Admit: 2011-12-02 | Discharge: 2011-12-02 | Disposition: A | Discharge status: Home / Self Care | Payer: Medicare Other | Source: Ambulatory Visit | Attending: Internal Medicine | Admitting: Internal Medicine

## 2011-12-02 ENCOUNTER — Telehealth: Payer: Self-pay | Admitting: *Deleted

## 2011-12-02 DIAGNOSIS — R1319 Other dysphagia: Secondary | ICD-10-CM | POA: Insufficient documentation

## 2011-12-02 DIAGNOSIS — R1310 Dysphagia, unspecified: Secondary | ICD-10-CM | POA: Diagnosis not present

## 2011-12-02 DIAGNOSIS — G809 Cerebral palsy, unspecified: Secondary | ICD-10-CM | POA: Diagnosis not present

## 2011-12-02 NOTE — Procedures (Signed)
Outpatient Modified Barium Swallow Procedure Note Patient Details  Name: [name] MRN: [id number] Date of Birth: Mar 11, 1986  Today's Date: 12/02/2011 Time:  -     Past Medical History:  Past Medical History  Diagnosis Date  . Cerebral palsy   . GERD (gastroesophageal reflux disease)   . Anxiety   . Depression   . Thyroid disease     hyper  . Incontinence of feces   . Palpitations   . Esophagitis    Past Surgical History:  Past Surgical History  Procedure Date  . Spine surgery ,11/20/2010, 2011  . Eye surgery   . Ears tubes   . Hamstring released   . Baclofen trial   . Baslofen pump implant   . Spinal fusion   . G-tube insert August 2006  . Spinal fusioncorrect 106 degree kyphosis   . Spinal fusion to correct 70 degree kyphosis   . Tonsillectomy   . Peg placement 10/21/2011    Procedure: PERCUTANEOUS ENDOSCOPIC GASTROSTOMY (PEG) REPLACEMENT;  Surgeon: Hart Carwin, MD;  Location: [facility];  Service: Endoscopy;  Laterality: N/A;   HPI:  26 y.o. male referred for an outpatient MBSS by outpatient SLP.  PMHX: cerebral palsy, Posterior cervical spinal fusion in 9/11 and 1/12 to correct kyphosis.  Profound dysphagia thereafter with gradual improvement.  G-tube placed 6 years ago for hydration and medication delivery.  Previous MBSS on 05/14/11 revealing silent aspiration with liquid trials.  Diet recommendation of Dys.1 (puree) and Honey-thick liquids.  Patient has been participating in dysphagia treatment, through 9/12 with upgraded diet.  Patient mother reports improved swallowing and greater PO intake in the last month.  Patient consumes mostly solid textures and little PO liquid intake due to refusal.  Mother reports "he has never really chewed his food." MBSS to determine current swallow function and diet consistenty tolerance.     Recommendation/Prognosis  Clinical Impression Dysphagia Diagnosis: Severe oral phase dysphagia;Mild pharyngeal phase dysphagia;Mild  cervical esophageal phase dysphagia Clinical impression: Demonstrates consistent swallow pattern with diagnosis of cerebral palsy marked by a discoordinated, ataxic and disorganized oral phase and a discoordinated timing of the pharyngeal phase leading to both silent laryngeal penetration and silent tracheal aspiration of thin liquids.  This aspiration was of a trace amount, however patient unable to follow direction to or elicit a volitional cough to clear penetrate or aspirate.  Additionally, patient did not masticate soft solid trial, which resided on UES, requiring thin liquid sip to clear through into the esophagus.  Mother not present for study, however discussion on the telephone revealed that he has been likely tolerating small amounts of aspiration with thin liquids and typically does not masticate his solids (life long).  Mother verbalized her understanding of aspiration and aspiration pna risks and wants to continue his current diet of mechanical soft and thin liquids, using gastrostomy tube as primary hydration/medication source.  Swallow Evaluation Recommendations Solid Consistency: Dysphagia 3 (Mechanical soft) Liquid Consistency: Thin Liquid Administration via: Straw Medication Administration: Via alternative means Compensations: Small sips/bites;Slow rate;Check for pocketing;Check for anterior loss;Follow solids with liquid Postural Changes and/or Swallow Maneuvers: Out of bed for meals;Seated upright 90 degrees;Upright 30-60 min after meal Oral Care Recommendations: Oral care BID Follow up Recommendations: None   Individuals Consulted Consulted and Agree with Results and Recommendations: Family member/caregiver Family Member Consulted: Mother (legal guardian) Report Sent to : Referring physician  General:  Date of Onset: 12/02/11 Type of Study: Repeat MBS Diet Prior to this Study: Dysphagia 3 (soft);Thin  liquids;Other (Comment) (G tube) Temperature Spikes Noted: No Respiratory  Status: Room air History of Intubation: No Behavior/Cognition: Alert;Cooperative;Other (comment) (Total assist for communication) Oral Cavity - Dentition: Adequate natural dentition Oral Motor / Sensory Function: Impaired motor Oral impairment: Right lingual;Left lingual;Right labial;Left labial;Other (Comment) (Ataxic oral motor function) Vision:  (Functional; unable to self feed due to physical deficts.) Patient Positioning: Upright in chair Baseline Vocal Quality: Clear Volitional Cough: Cognitively unable to elicit Volitional Swallow: Able to elicit Anatomy: Within functional limits Pharyngeal Secretions: Not observed secondary MBS  Oral Phase Oral Preparation/Oral Phase Oral Phase: Impaired Oral - Thin Oral - Thin Cup: Incomplete tongue to palate contact;Lingual pumping;Reduced posterior propulsion;Piecemeal swallowing;Lingual/palatal residue;Delayed oral transit;Other (Comment) (Discoordinated; disorganized. Decr. lip seal) Oral - Thin Straw: Lingual pumping;Incomplete tongue to palate contact;Reduced posterior propulsion;Lingual/palatal residue;Piecemeal swallowing;Delayed oral transit;Other (Comment) (Discoordinated; disorganized.  Decr. lip seal) Oral - Solids Oral - Puree: Other (Comment);Lingual pumping;Incomplete tongue to palate contact;Reduced posterior propulsion;Right pocketing in lateral sulci (Discoordinated; disorganized. Decr. lip seal) Oral - Mechanical Soft: Lingual pumping;Incomplete tongue to palate contact;Impaired mastication;Reduced posterior propulsion;Right pocketing in lateral sulci;Other (Comment) (Discoordinated; disorganized. Decr. lip seal) Oral Phase - Comment Oral Phase - Comment: Severely ataxic, disorganized and discoordinated yet trace oral residue following 3-4 piecemeal swallows Pharyngeal Phase  Pharyngeal Phase Pharyngeal Phase: Impaired Pharyngeal - Thin Pharyngeal - Thin Cup: Penetration/Aspiration before swallow;Penetration/Aspiration during  swallow;Other (Comment);Delayed swallow initiation (Discoordinated timing) Penetration/Aspiration details (thin cup): Material enters airway, remains ABOVE vocal cords then ejected out;Material enters airway, remains ABOVE vocal cords and not ejected out Pharyngeal - Thin Straw: Penetration/Aspiration before swallow;Penetration/Aspiration during swallow;Delayed swallow initiation;Trace aspiration Penetration/Aspiration details (thin straw): Material enters airway, remains ABOVE vocal cords then ejected out;Material enters airway, remains ABOVE vocal cords and not ejected out;Material enters airway, passes BELOW cords without attempt by patient to eject out (silent aspiration) Pharyngeal - Solids Pharyngeal - Puree: Delayed swallow initiation;Other (Comment) (Discoordinated timing) Pharyngeal - Mechanical Soft: Delayed swallow initiation;Other (Comment) (Discoordinated timing) Cervical Esophageal Phase  Cervical Esophageal Phase Cervical Esophageal Phase: Impaired Cervical Esophageal Phase - Thin Thin Cup: Reduced cricopharyngeal relaxation Thin Straw: Reduced cricopharyngeal relaxation Cervical Esophageal Phase - Solids Puree: Reduced cricopharyngeal relaxation Mechanical Soft: Reduced cricopharyngeal relaxation Cervical Esophageal Phase - Comment Cervical Esophageal Comment: Consistent UES stasis of all tested consistencies (trace with liquids; minimal amount of solid bolus)   Myra Rude, M.S.,CCC-SLP Pager [phone]  12/02/2011, 2:00 PM

## 2011-12-02 NOTE — Telephone Encounter (Signed)
Approved coverage of Depakote Sprinkles through 11-01-12.

## 2011-12-08 ENCOUNTER — Other Ambulatory Visit: Payer: Self-pay | Admitting: Family Medicine

## 2011-12-08 MED ORDER — DOXYCYCLINE HYCLATE 100 MG PO TBEC: 100.0000 mg | DELAYED_RELEASE_TABLET | Freq: Two times a day (BID) | ORAL | Status: DC

## 2011-12-08 MED ORDER — DIVALPROEX SODIUM 125 MG PO CPSP: ORAL_CAPSULE | ORAL | Status: DC

## 2011-12-08 NOTE — Telephone Encounter (Signed)
Last seen 07/21/11 and filled doxy 08/21/11 and Depakote 10/21/11 # 30. Please advise    KP

## 2011-12-09 ENCOUNTER — Encounter: Payer: Self-pay | Admitting: Internal Medicine

## 2011-12-09 ENCOUNTER — Ambulatory Visit (INDEPENDENT_AMBULATORY_CARE_PROVIDER_SITE_OTHER): Payer: Medicare Other | Admitting: Internal Medicine

## 2011-12-09 VITALS — BP 118/82 | Ht 65.0 in

## 2011-12-09 DIAGNOSIS — R633 Feeding difficulties, unspecified: Secondary | ICD-10-CM

## 2011-12-09 DIAGNOSIS — K219 Gastro-esophageal reflux disease without esophagitis: Secondary | ICD-10-CM

## 2011-12-09 MED ORDER — SUCRALFATE 1 GM/10ML PO SUSP: ORAL | Status: DC

## 2011-12-09 MED ORDER — CIMETIDINE HCL 300 MG/5ML PO SOLN: ORAL | Status: DC

## 2011-12-09 NOTE — Patient Instructions (Signed)
We have sent the following medications to your pharmacy for you to pick up at your convenience: Cimetidine 5 ml (300 mg) per PEG once daily Carafate 2 teaspoons per PEG twice daily. CC: Dr Laury Axon

## 2011-12-09 NOTE — Progress Notes (Signed)
[name] 08-Apr-1986 MRN [id number]    History of Present Illness:  This is a 26 year old, white male with cerebral palsy dependent on gastroscopy tube feedings. He has bipolar disorder. A modified barium swallow with speech pathology on December 02, 2011 showed severe oral phase dysphagia, mild pharyngeal phase dysphagia and mild cervical esophageal phase dysphagia. He had silent laryngeal penetration and silent tracheal aspiration. The recommendations were for dysphagia 3 mechanical soft diet and thin liquids, administered via a straw;  his hydration is facilitated through gastrostomy. He has occasional gastroesophageal reflux. His gastrostomy was changed in August 2012. His mother would prefer a gastrostomy which is attached with sutures like the original PEG which was placed at [facility] and lasted 6 years. but actually we do not secure gastrostomy tubes with sutures any more and I am not sure that is we send him back to [facility] that he would have the PEG placed surgically.   Past Medical History  Diagnosis Date  . Cerebral palsy   . GERD (gastroesophageal reflux disease)   . Anxiety   . Depression   . Thyroid disease     hyper  . Incontinence of feces   . Palpitations   . Esophagitis    Past Surgical History  Procedure Date  . Spine surgery ,11/20/2010, 2011  . Eye surgery   . Ears tubes   . Hamstring released   . Baclofen trial   . Baslofen pump implant   . Spinal fusion   . G-tube insert August 2006  . Spinal fusioncorrect 106 degree kyphosis   . Spinal fusion to correct 70 degree kyphosis   . Tonsillectomy   . Peg placement 10/21/2011    Procedure: PERCUTANEOUS ENDOSCOPIC GASTROSTOMY (PEG) REPLACEMENT;  Surgeon: Hart Carwin, MD;  Location: [facility];  Service: Endoscopy;  Laterality: N/A;    reports that he has never smoked. He has never used smokeless tobacco. He reports that he does not drink alcohol or use illicit drugs. family history includes Asthma in his mother;  Cancer in his maternal grandfather and maternal grandmother; Heart disease in his paternal grandfather; and Hyperlipidemia in his mother. Allergies  Allergen Reactions  . Sulfamethoxazole W/Trimethoprim   . Sulfonamide Derivatives         Review of Systems: Negative for lower GI symptoms. Negative for abdominal pain  The remainder of the 10 point ROS is negative except as outlined in H&P   Physical Exam: General appearance  Well developed, in no distress. Sitting in a wheelchair. Unable to communicate clearly Eyes- non icteric. HEENT nontraumatic, normocephalic. Mouth no lesions, tongue papillated, no cheilosis. Neck supple without adenopathy, thyroid not enlarged, no carotid bruits, no JVD. Lungs Clear to auscultation bilaterally. Cor normal S1, normal S2, regular rhythm, no murmur,  quiet precordium. Abdomen: Soft with percutaneous gastrostomy and left upper quadrant. Intense erythema surrounding the adapter. No drainage. Normal active bowel sounds.  Rectal: Not done Extremities no pedal edema. Skin no lesions. Neurological alert and oriented x 3. Psychological normal mood and affect.  Assessment and Plan:  Problem #1 Functioning percutaneous gastrostomy. Patient has gastroesophageal reflux. We will start him on liquid Tagamet 300 mg per 5 cc per gastrostomy daily. He is to continue antireflux measures. His mother will continue him on a dysphagia 3 diet. The gastrostomy should be changed on a yearly basis. We will refill his Carafate.    12/09/2011 [name]

## 2011-12-15 ENCOUNTER — Telehealth: Payer: Self-pay | Admitting: Family Medicine

## 2011-12-15 NOTE — Telephone Encounter (Signed)
Refill- diazepam 5mg  tablet. Take one tablet by mouth in the evening and every 8 hrs as needed. Qty 30 last fill 1.10.13

## 2011-12-15 NOTE — Telephone Encounter (Signed)
Last filled 07/24/11 # 30 with 5 refills and seen 09/01/11. Please advise    KP

## 2011-12-15 NOTE — Telephone Encounter (Signed)
Refill for 6 months. 

## 2011-12-16 ENCOUNTER — Other Ambulatory Visit: Payer: Self-pay | Admitting: *Deleted

## 2011-12-16 MED ORDER — DIAZEPAM 5 MG PO TABS: 5.0000 mg | ORAL_TABLET | ORAL | Status: DC

## 2011-12-16 NOTE — Telephone Encounter (Signed)
Faxed.   KP 

## 2011-12-16 NOTE — Telephone Encounter (Signed)
Pt mother called to advise the pharmacy has notified our office about the medication refill diazepam per pt is now out. Noted 12-15-11 the medication was sent to the pharmacy for 30 tablets with 5 refills, called [facility] to advise, spoke to Stanton Kidney and advised that we had faxed in the medication on 12-15-11 the pharmacy stated they did not receive the order, gave verbal order for diazapam 5mg  #30 with 5 refills take one 5mg  tablet by mouth in the evening and every 8 hours PRN, called pt mother to advise the order has been called in, pt mother understood.

## 2011-12-23 IMAGING — CR DG HIP COMPLETE 2+V*R*
3 series · 3 of 3 positions shown · non-contrast
Comparison: None.

CLINICAL DATA: Right hip pain, cerebral palsy

RIGHT HIP - COMPLETE 2+ VIEW

[t pelvis a.p.]
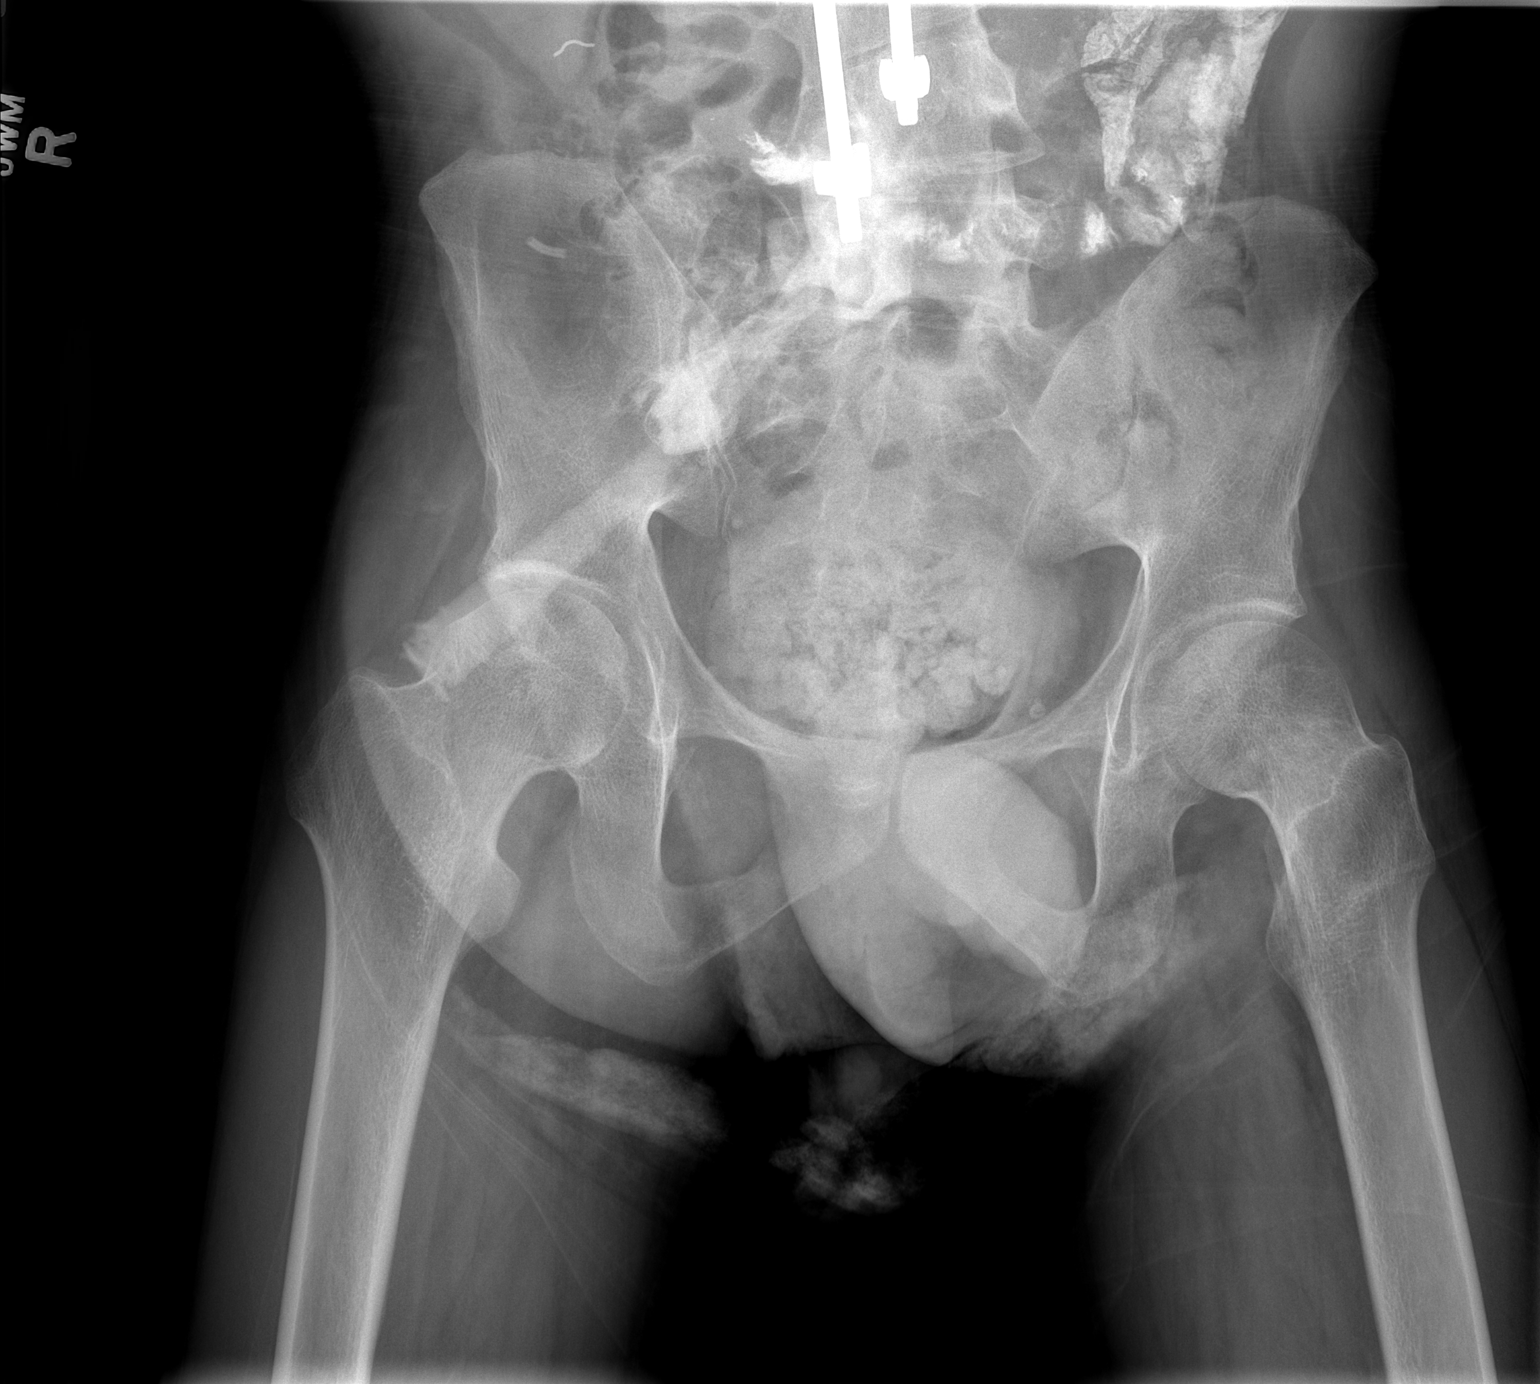

[t hip ap right]
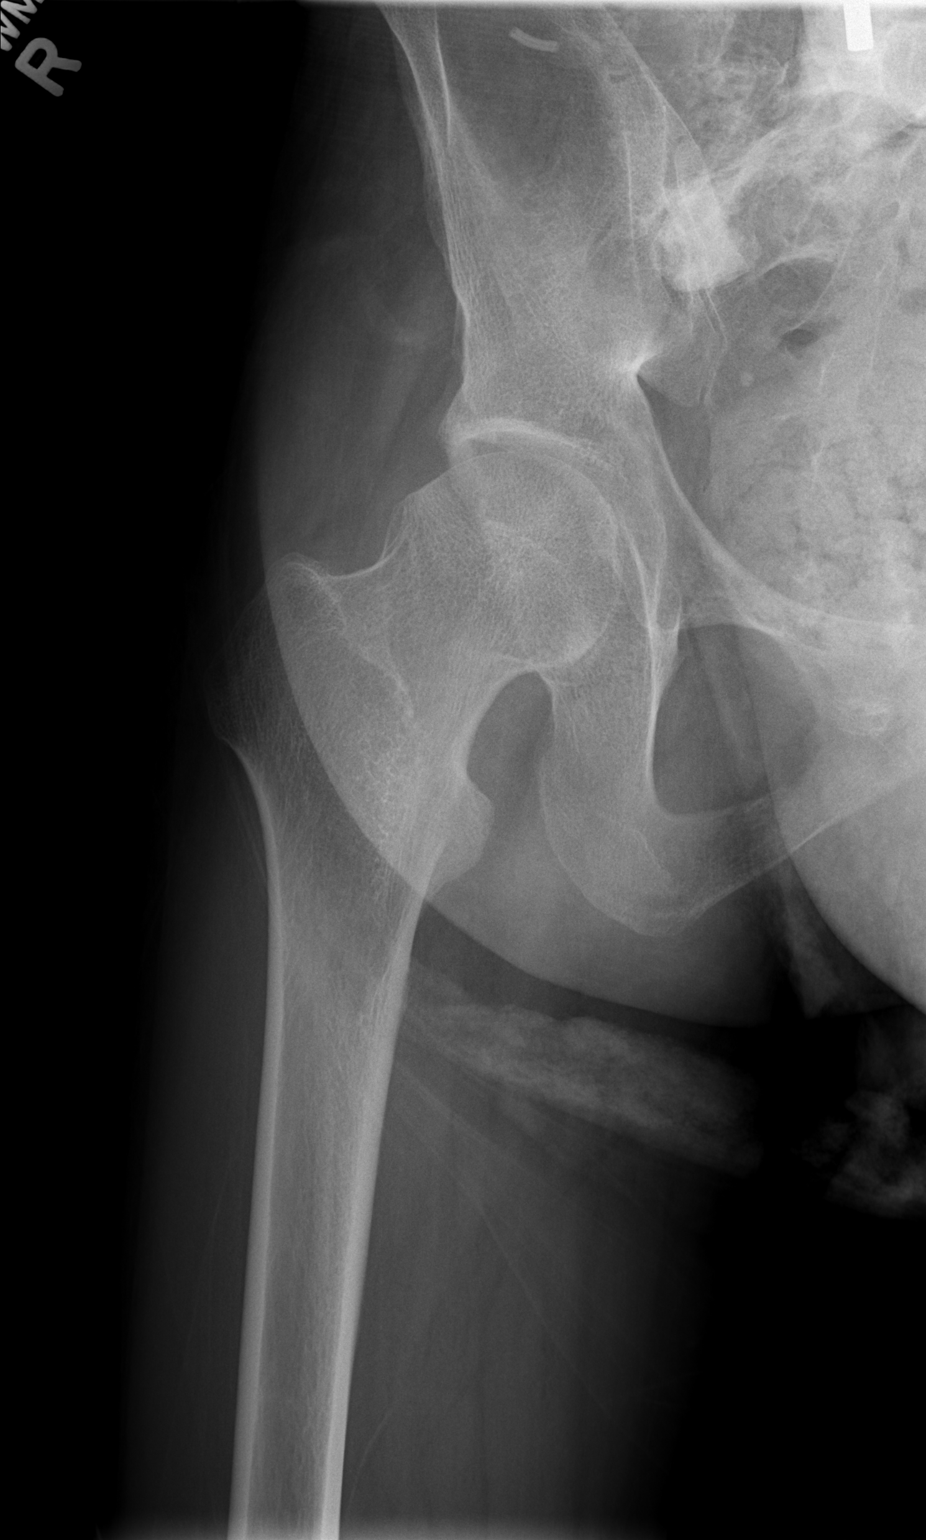

[t hip frog leg right]
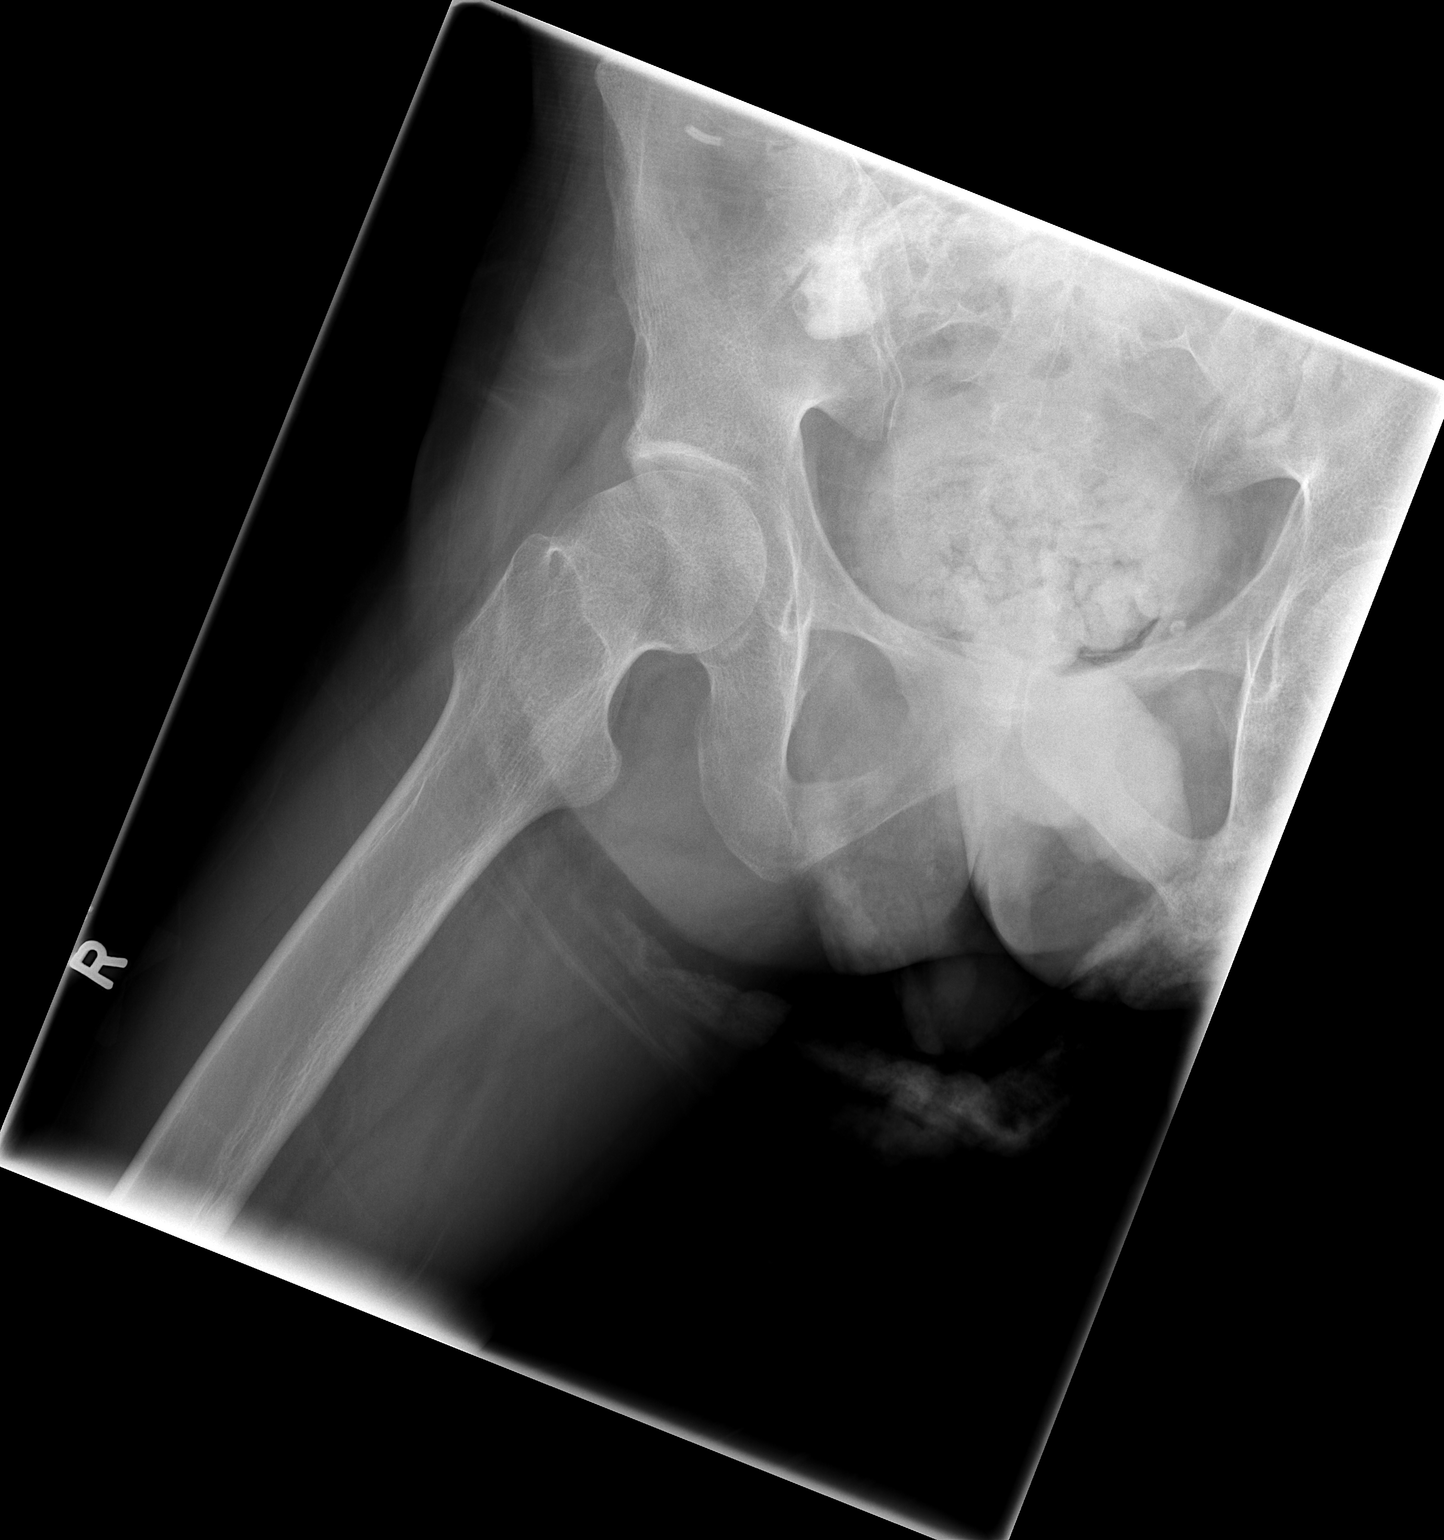

[3 of 3 positions shown; findings below may reference images not displayed]

FINDINGS: No fracture or dislocation is seen.

The bilateral hip joint spaces are symmetric.

The visualized bony pelvis appears intact.

Moderate stool in the rectum. Residual contrast in the small bowel.

Degenerative changes of the lower lumbar spine with Harrington
rods.
IMPRESSION: No fracture or dislocation is seen.

## 2011-12-31 ENCOUNTER — Telehealth: Payer: Self-pay | Admitting: Family Medicine

## 2011-12-31 MED ORDER — NONFORMULARY OR COMPOUNDED ITEM: Status: DC

## 2011-12-31 NOTE — Telephone Encounter (Signed)
Orders faxed

## 2011-12-31 NOTE — Telephone Encounter (Signed)
Ok to give order? 

## 2011-12-31 NOTE — Telephone Encounter (Signed)
Patient's case manager Olegario Messier) states she needs an order from Dr. Laury Axon for a bath lift chair for the patient. Fax # [phone]

## 2012-01-22 ENCOUNTER — Encounter: Payer: Self-pay | Admitting: Internal Medicine

## 2012-01-22 DIAGNOSIS — M4180 Other forms of scoliosis, site unspecified: Secondary | ICD-10-CM | POA: Diagnosis not present

## 2012-01-22 DIAGNOSIS — M4000 Postural kyphosis, site unspecified: Secondary | ICD-10-CM | POA: Diagnosis not present

## 2012-01-22 DIAGNOSIS — M4150 Other secondary scoliosis, site unspecified: Secondary | ICD-10-CM | POA: Diagnosis not present

## 2012-01-22 DIAGNOSIS — Z981 Arthrodesis status: Secondary | ICD-10-CM | POA: Diagnosis not present

## 2012-01-22 DIAGNOSIS — G809 Cerebral palsy, unspecified: Secondary | ICD-10-CM | POA: Diagnosis not present

## 2012-01-22 DIAGNOSIS — M4120 Other idiopathic scoliosis, site unspecified: Secondary | ICD-10-CM | POA: Diagnosis not present

## 2012-01-26 IMAGING — XA IR REPLACE G-TUBE/COLONIC TUBE
1 series · 4 of 4 positions shown · IV contrast (agent unspecified)
Comparison: None.

CLINICAL DATA: Indwelling gastrostomy tube has inadvertently come
out.  The patient requires replacement.

GASTROSTOMY CATHETER REPLACEMENT
Contrast:  6 ml Lmnipaque-PXX
Fluoro time:  0.1 minutes

[Series 1: run · 4 of 4 slices shown]
[im 1/4]
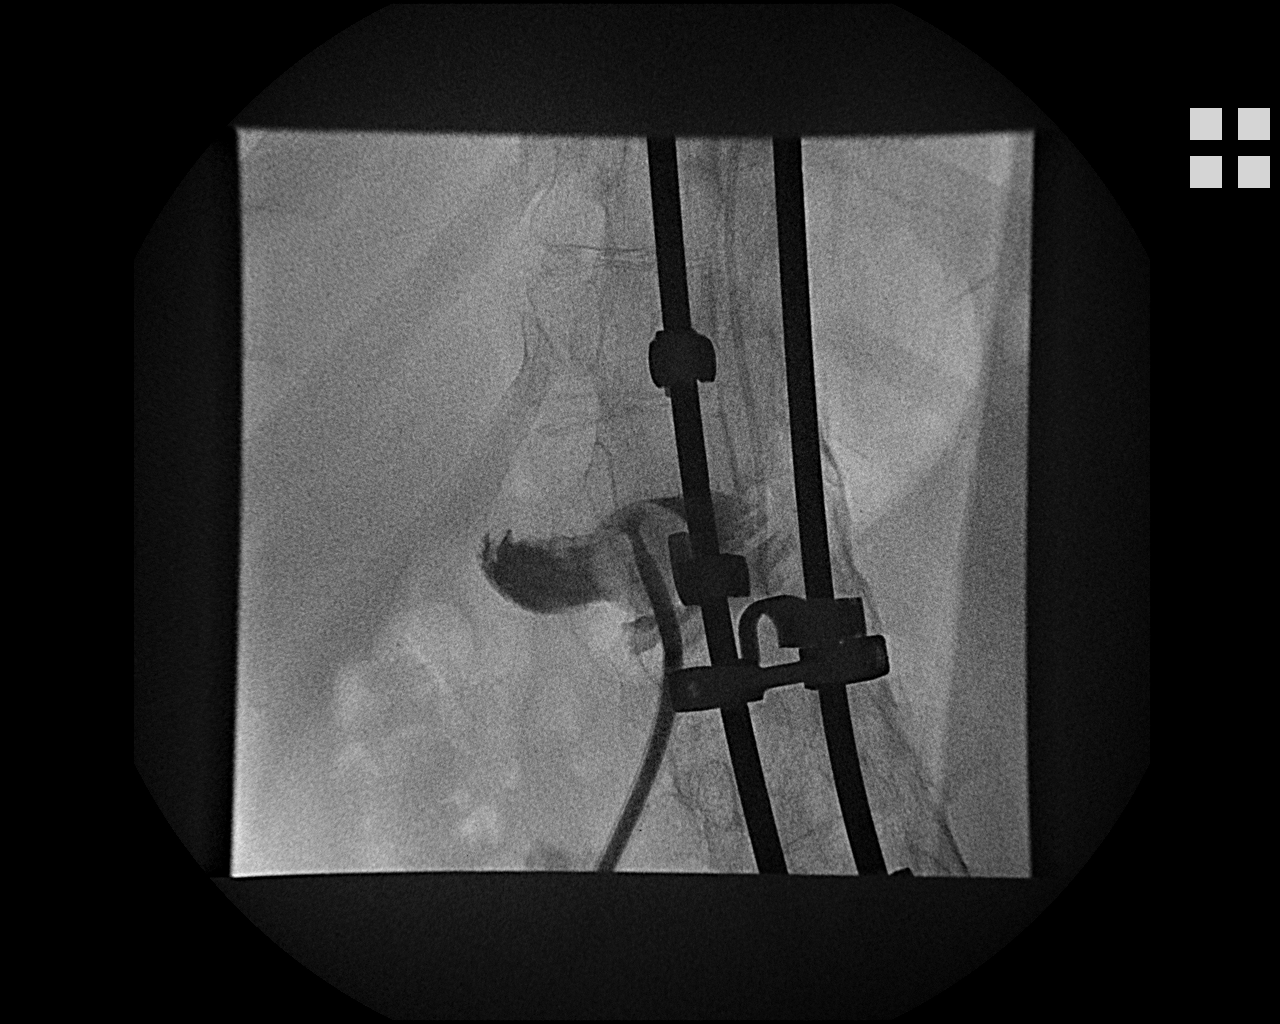
[im 2/4]
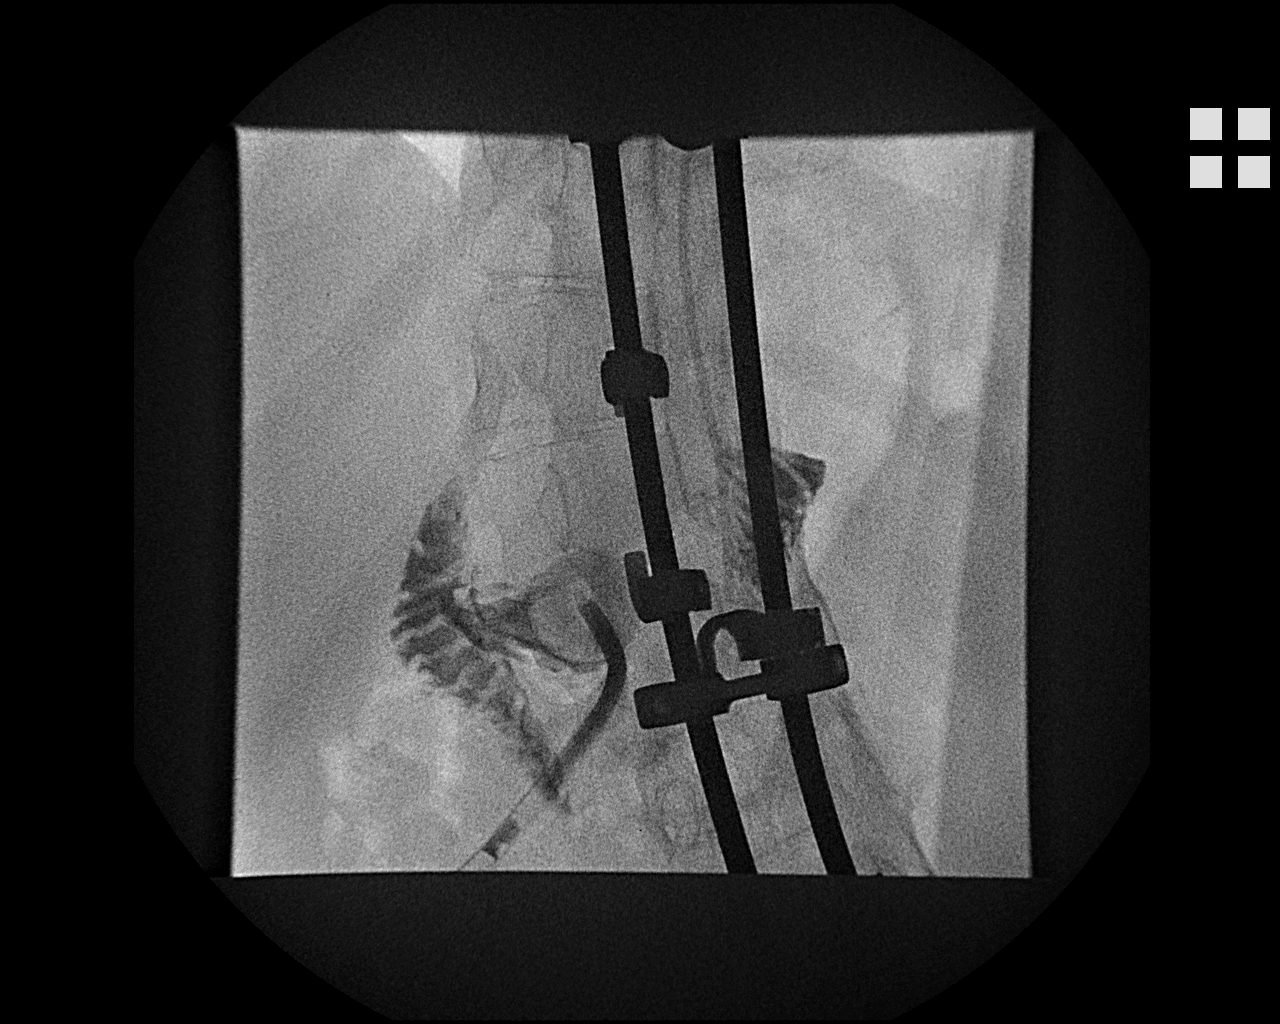
[im 3/4]
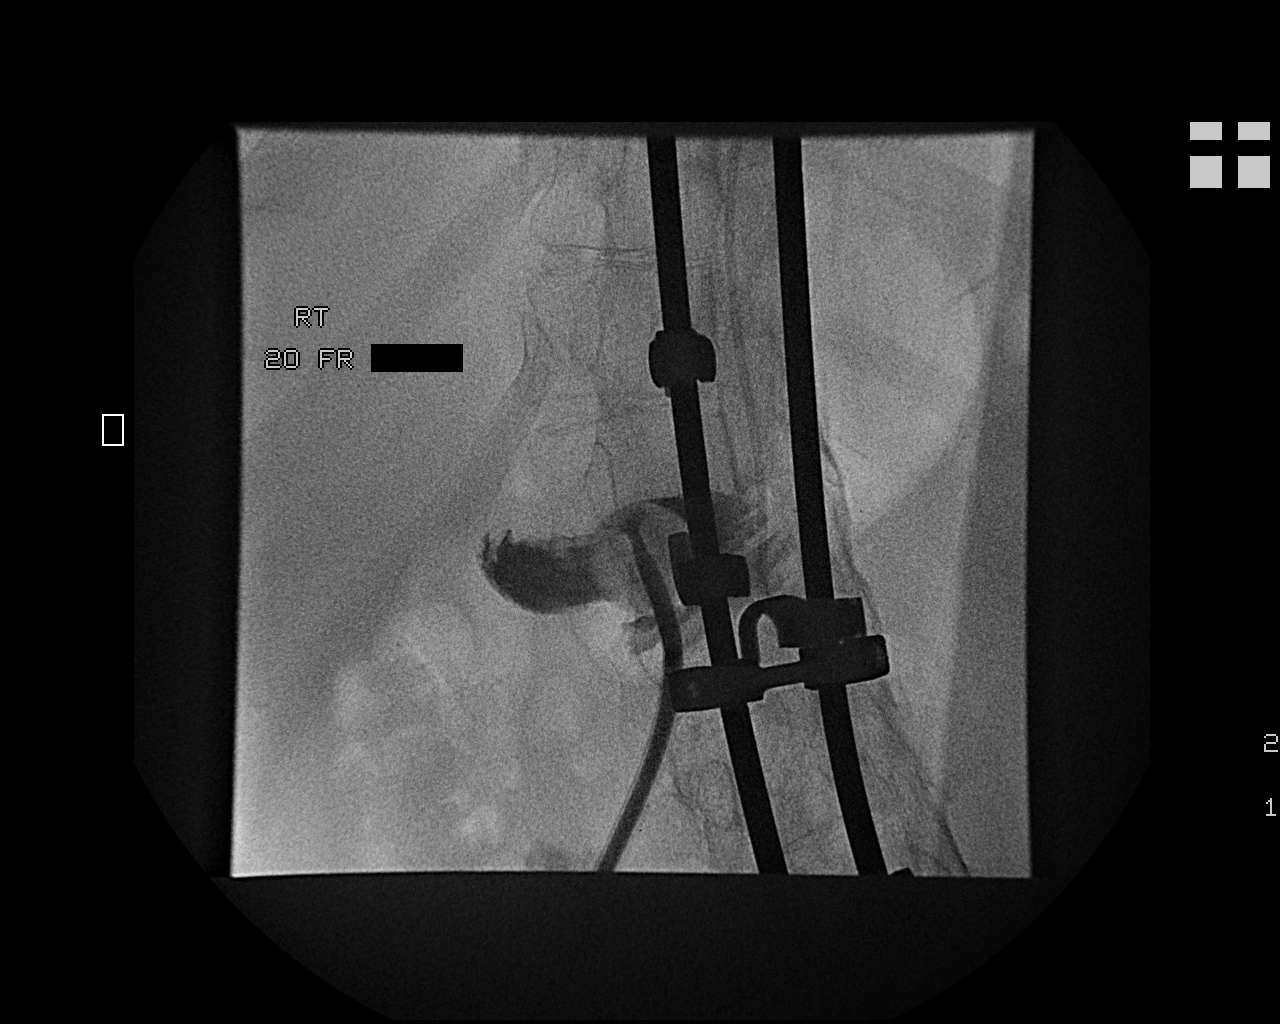
[im 4/4]
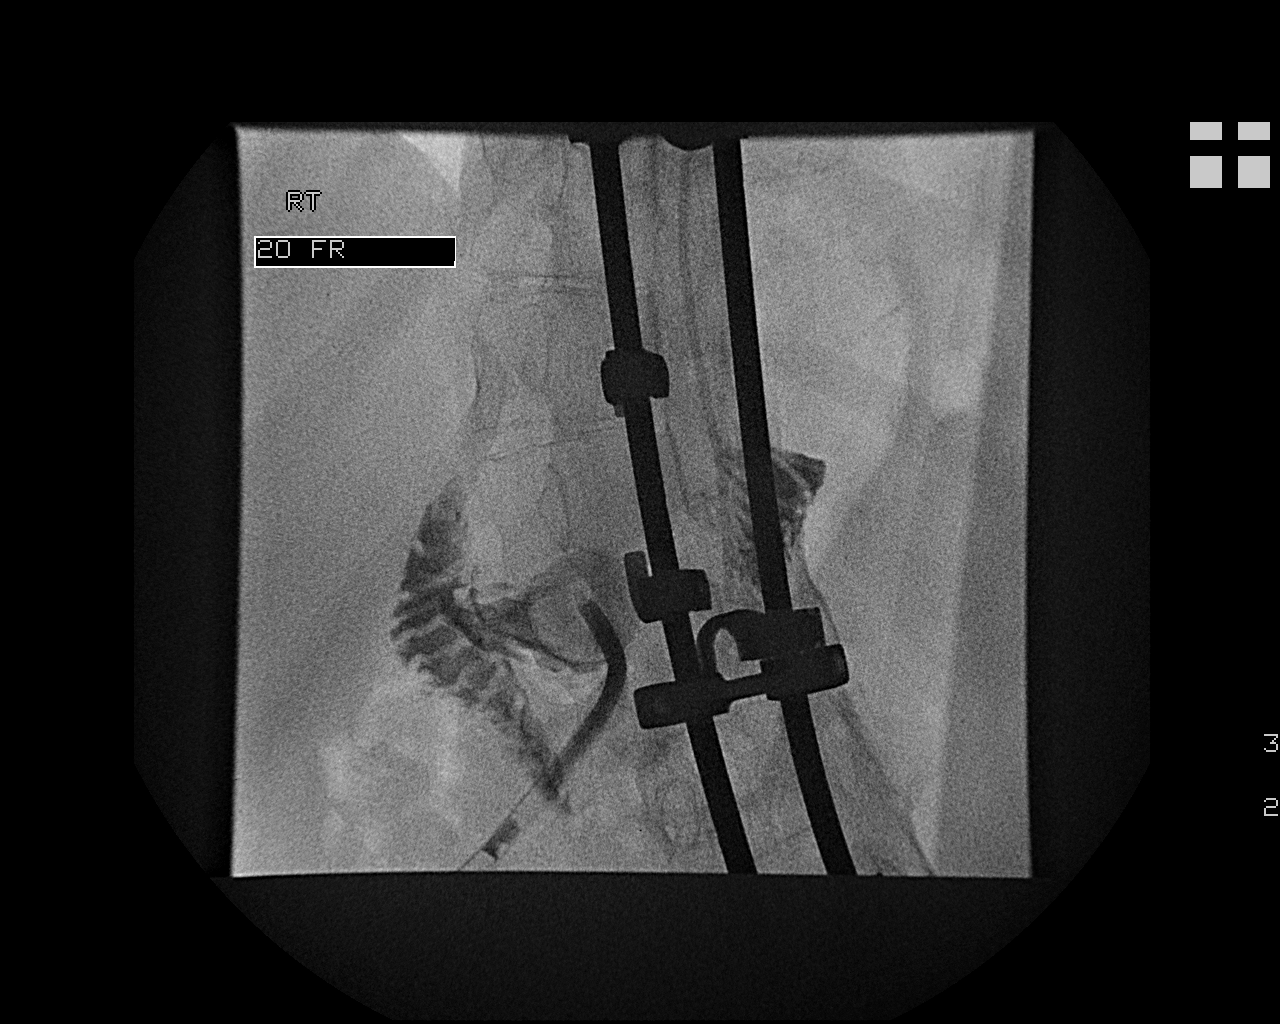

[4 of 4 positions shown; findings below may reference images not displayed]

FINDINGS: A new 20-French balloon retention gastrostomy tube was
advanced through the indwelling percutaneous tract.  The retention
balloon was inflated with saline and the catheter checked with
contrast material to confirm position.  Fluoroscopy demonstrates
intraluminal positioning within the stomach.
IMPRESSION: Replacement of gastrostomy with new 20-French balloon retention
gastrostomy tube.  The catheter lies in the stomach.

## 2012-02-18 ENCOUNTER — Encounter (HOSPITAL_COMMUNITY): Payer: Self-pay | Admitting: *Deleted

## 2012-02-18 ENCOUNTER — Telehealth: Payer: Self-pay | Admitting: Internal Medicine

## 2012-02-18 ENCOUNTER — Emergency Department (HOSPITAL_COMMUNITY): Payer: Medicare Other

## 2012-02-18 ENCOUNTER — Other Ambulatory Visit: Payer: Self-pay | Admitting: Family Medicine

## 2012-02-18 ENCOUNTER — Emergency Department (HOSPITAL_COMMUNITY)
Admission: EM | Admit: 2012-02-18 | Discharge: 2012-02-18 | Disposition: A | Discharge status: Home / Self Care | Payer: Medicare Other | Attending: Emergency Medicine | Admitting: Emergency Medicine

## 2012-02-18 DIAGNOSIS — G809 Cerebral palsy, unspecified: Secondary | ICD-10-CM | POA: Insufficient documentation

## 2012-02-18 DIAGNOSIS — F341 Dysthymic disorder: Secondary | ICD-10-CM | POA: Insufficient documentation

## 2012-02-18 DIAGNOSIS — K9423 Gastrostomy malfunction: Secondary | ICD-10-CM | POA: Diagnosis not present

## 2012-02-18 DIAGNOSIS — K219 Gastro-esophageal reflux disease without esophagitis: Secondary | ICD-10-CM | POA: Diagnosis not present

## 2012-02-18 DIAGNOSIS — Z431 Encounter for attention to gastrostomy: Secondary | ICD-10-CM | POA: Diagnosis not present

## 2012-02-18 DIAGNOSIS — Z79899 Other long term (current) drug therapy: Secondary | ICD-10-CM | POA: Diagnosis not present

## 2012-02-18 DIAGNOSIS — E079 Disorder of thyroid, unspecified: Secondary | ICD-10-CM | POA: Insufficient documentation

## 2012-02-18 DIAGNOSIS — Y849 Medical procedure, unspecified as the cause of abnormal reaction of the patient, or of later complication, without mention of misadventure at the time of the procedure: Secondary | ICD-10-CM | POA: Insufficient documentation

## 2012-02-18 MED ORDER — IOHEXOL 300 MG/ML  SOLN
50.0000 mL | Freq: Once | INTRAMUSCULAR | Status: AC | PRN
  Administered 2012-02-18: 50 mL via INTRAVENOUS

## 2012-02-18 NOTE — Discharge Instructions (Signed)
Care of a Feeding Tube Site  Individuals who have trouble swallowing or cannot take food or medication by mouth are sometimes given feeding tubes. A feeding tube can go into the nose and down to the stomach or through the skin in the abdomen and into the stomach or small bowel. Some of the names of these feeding tubes are gastrostomy tubes, PEG lines, nasogastric tubes, and gastrojejunostomy tubes.   Liquids or foods that have been made into a thick, smooth soup consistency (pureed), along with medications, may be given through the tube. There are several ways to give the liquid food (formula), and there are several kinds of prescribed formulas. Your caregiver will arrange for you to get nutrition in the right amounts for you.  EQUIPMENT NEEDED FOR TUBE FEEDING   A 60 mL syringe.   Formula suggested by your caregiver or dietician.   A specific plug to put into the feeding tube tip during the times when you are not getting food or medications.   A feeding pump or a place to hang your food container (an IV pole or a wall hook) so it can operate by gravity while you are getting your feedings. You attach the tube from the food container to the end of the feeding tube. Your caregivers will help you with these supplies or tell you where to get them.  PROCEDURE FOR TUBE FEEDING  1. Wash your hands before touching the equipment, food, medications, or the site (where the tube enters your body).  2. Check the tube placement before starting the feeding by removing the plug in the end of the feeding tube and attaching a syringe to the feeding tube. Pull the plunger back and you will usually see some yellowish or greenish fluid. This tells you the tube is in the correct place. Note the amount and gently push the residual back into the tube.   Ask your caregiver if there are instances when you would not start tube feedings depending on the amount or type of contents withdrawn from the stomach.   If gastrointestinal (GI)  contents do not show when you pull back on the plunger, measure the length from the stoma site (the opening in your body made for feeding) to the end of your feeding tube. If this different from the previous measurement, you should call your caregiver.   If at any point the feeding tube seems blocked, use your syringe and pull back on the plunger. If this does not work, try to gently force some warm water through the tube. If none of these methods work, call your caregiver. It is important not to miss your medications, food, or water.  3. If everything with the tube seems to be okay, insert the tip of the tube from the food container into your feeding tube.  4. While sitting up or with your head propped up at least 30 degrees to 45 degrees, start the feeding. If you develop coughing or have trouble breathing, stop the feeding immediately.  5. Administer the feeding for the length of time instructed by your caregiver.  6. To keep the tube open, following the feeding, flush the tube with 30 mLs of water using a syringe, and replace the plug at the end of the feeding tube.  7. Do not leave unused food out between feedings. Refrigerate or store it as directed.  GIVING YOUR MEDICATIONS THROUGH THE TUBE   Use liquid forms of your medications, if available.   Some pills or   tablets may be crushed and put into warm water. Do not use hot water, which could affect the contents.   Ask the pharmacist if you can crush your pills. Do not crush capsules that have SR (sustained release), XR (extended release), or CD (controlled release) printed on them unless your caregiver or pharmacist says it is okay.   After you have crushed your pills or capsules into small pieces or a powder, let the pieces dissolve in warm (not hot) water so that no pieces will clog your tube. Draw medication up into your syringe by pulling back on the plunger.   Attach the syringe to the end of the feeding tube and push on the plunger to give your  medication.   Flush the tube with 30 mLs of water after giving your medication. This makes sure you have received all of your medications.  CARE OF THE SKIN AROUND THE ENTRANCE OF THE TUBE   Check the skin daily where the tube enters the abdomen for redness, irritation, drainage, or tenderness.   Clean around the tube daily with water or soap and water using cotton-tipped applicators or gauze squares. Dry completely after cleaning.   Change the dressing around the tube insertion site daily or as instructed.   Apply antibiotic ointment around the site, if directed by your caregiver.   If the dressing becomes wet or soiled, change it as soon as convenient.   You may use tape to fasten your feeding tube to your skin for comfort or do as directed.  SEEK IMMEDIATE MEDICAL CARE IF:    You develop coughing or have trouble breathing during a feeding. Stop the feeding and call your caregiver immediately.   You notice swelling, redness, drainage, or tenderness at the tube insertion site.   You develop pain,nausea, vomiting, diarrhea, constipation, or bleeding around the tube insertion site.   The tube seems plugged and you are unable to get water through the tube.   There is food leaking from around the tube.   The tube falls out.  Document Released: 10/19/2005 Document Revised: 10/08/2011 Document Reviewed: 01/14/2007  [employer] Patient Information 2012 [employer]

## 2012-02-18 NOTE — ED Notes (Signed)
Pt. Mother stated they were leaving.

## 2012-02-18 NOTE — Telephone Encounter (Signed)
refill for Olanzapine 5MG  Tablet Qty 30, with 5-refills Last fill 3.19.13 Take 1-tablet at bedtime Last OV 2.6.13

## 2012-02-18 NOTE — Telephone Encounter (Signed)
Spoke with Dr. Leone Payor and patient should go to ED to have tube replaced. [name] notified.

## 2012-02-18 NOTE — Telephone Encounter (Signed)
[name] calling and his G-tube for feeding fell out yesterday and she put it back in. Today if fell out again.  She states he had had it replaced in IR in the past but Dr. Juanda Chance placed the last one. Dr. Juanda Chance is off today and is in [facility] tomorrow. Please, advise.[name] DOD)

## 2012-02-18 NOTE — ED Provider Notes (Signed)
History     CSN: [id number]  Arrival date & time 02/18/12  1739   First MD Initiated Contact with Patient 02/18/12 1839      Chief Complaint  Patient presents with  . GI Problem    pt here for g-tube placement. no other complaints.     (Consider location/radiation/quality/duration/timing/severity/associated sxs/prior treatment) HPI Comments: Patient presents with his mother who gives the history to the patient's developmental  delay.  Patient has a PEG tube which has been intermittently falling out over the last one to 2 days.  His mother has continue to replace it but wants to him because the balloon appears to have a leak.  Mother called the GI physicians who could not see him so she came here to the emergency department.  Patient otherwise has no acute issues at this time.  Mother just wants to have the PEG tube  replaced.  Patient is a 26 y.o. male presenting with GI illlness. The history is provided by a parent. No language interpreter was used.  GI Problem  This is a recurrent problem. The current episode started 3 to 5 hours ago. Pertinent negatives include no abdominal pain, no chills, no headaches and no cough.    Past Medical History  Diagnosis Date  . Cerebral palsy   . GERD (gastroesophageal reflux disease)   . Anxiety   . Depression   . Thyroid disease     hyper  . Incontinence of feces   . Palpitations   . Esophagitis     Past Surgical History  Procedure Date  . Spine surgery ,11/20/2010, 2011  . Eye surgery   . Ears tubes   . Hamstring released   . Baclofen trial   . Baslofen pump implant   . Spinal fusion   . G-tube insert August 2006  . Spinal fusioncorrect 106 degree kyphosis   . Spinal fusion to correct 70 degree kyphosis   . Tonsillectomy   . Peg placement 10/21/2011    Procedure: PERCUTANEOUS ENDOSCOPIC GASTROSTOMY (PEG) REPLACEMENT;  Surgeon: Hart Carwin, MD;  Location: [facility];  Service: Endoscopy;  Laterality: N/A;    Family History    Problem Relation Age of Onset  . Asthma Mother   . Hyperlipidemia Mother   . Cancer Maternal Grandmother     breast  . Cancer Maternal Grandfather     prostate  . Heart disease Paternal Grandfather     History  Substance Use Topics  . Smoking status: Never Smoker   . Smokeless tobacco: Never Used  . Alcohol Use: No      Review of Systems  Constitutional: Negative.  Negative for fever and chills.  HENT: Negative.   Eyes: Negative.  Negative for discharge and redness.  Respiratory: Negative.  Negative for cough and shortness of breath.   Cardiovascular: Negative.  Negative for chest pain.  Gastrointestinal: Negative.  Negative for abdominal pain.  Genitourinary: Negative.   Musculoskeletal: Negative.   Skin: Negative.   Neurological: Negative for headaches.  Hematological: Negative.  Negative for adenopathy.  Psychiatric/Behavioral: Negative.   All other systems reviewed and are negative.    Allergies  Sulfamethoxazole w/trimethoprim and Sulfonamide derivatives  Home Medications   Current Outpatient Rx  Name Route Sig Dispense Refill  . ACETAMINOPHEN 500 MG/5ML PO LIQD Oral Take by mouth as directed. In tube every 6 hours as needed     . ALBUTEROL SULFATE (2.5 MG/3ML) 0.083% IN NEBU Nebulization Take 2.5 mg by nebulization every 6 (six)  hours as needed.      Marland Kitchen CIMETIDINE HCL 300 MG/5ML PO SOLN  Take 5 ml by PEG once daily 240 mL 3    Take 5 cc per PEG once daily  . DIAZEPAM 5 MG PO TABS Oral Take 1 tablet (5 mg total) by mouth as directed. 1 tablet in the evening and every 8 hours prn 30 tablet 5  . DIVALPROEX SODIUM 125 MG PO CPSP  Take 2 capsules in the am and 4 capsules every pm via g tube---Brand Name Medically Neccessary 180 capsule 2    Dispense as written.  Marland Kitchen DOXYCYCLINE HYCLATE 100 MG PO TBEC Oral Take 1 tablet (100 mg total) by mouth 2 (two) times daily. 60 tablet 5  . NONFORMULARY OR COMPOUNDED ITEM  Bath lift chair 1 each 0  . SCANDISHAKE (LACTOSE FREE)  PO PACK Oral Take by mouth. 1000 ml 40 ml /hour in tube every 24 hours     . OLANZAPINE 5 MG PO TABS Oral Take 1 tablet (5 mg total) by mouth at bedtime. 30 tablet 5  . PAROXETINE HCL 30 MG PO TABS Oral Take 1 tablet (30 mg total) by mouth daily. 30 tablet 11  . POLYETHYLENE GLYCOL 3350 PO PACK Oral Take 17 g by mouth as directed.      . SUCRALFATE 1 GM/10ML PO SUSP  Take 2 teaspoons twice daily per PEG 420 mL 3  . TRETINOIN 0.1 % EX CREA Topical Apply topically at bedtime.        BP 125/74  Physical Exam  Nursing note and vitals reviewed. Constitutional: He appears well-developed and well-nourished.  Non-toxic appearance. He does not have a sickly appearance.       Patient is lying in his wheelchair.  HENT:  Head: Normocephalic and atraumatic.  Eyes: Conjunctivae, EOM and lids are normal. Pupils are equal, round, and reactive to light.  Neck: Trachea normal, normal range of motion and full passive range of motion without pain. Neck supple.  Cardiovascular: Normal rate, regular rhythm and normal heart sounds.   Pulmonary/Chest: Effort normal and breath sounds normal. No respiratory distress.  Abdominal: Soft. Normal appearance. He exhibits no distension. There is no tenderness. There is no rebound and no CVA tenderness.       There is a central abdominal wound that appears to be a well-developed tract with no drainage or surrounding erythema.  Musculoskeletal:       Decreased muscle tone  Neurological: He is alert. He has normal strength.  Skin: Skin is warm, dry and intact. No rash noted.  Psychiatric: He has a normal mood and affect. His behavior is normal. Thought content normal.    ED Course  Gastrostomy tube replacement Date/Time: 02/18/2012 7:09 PM Performed by: Emeline General A Authorized by: Emeline General A Consent: Verbal consent obtained. Written consent not obtained. Risks and benefits: risks, benefits and alternatives were discussed Consent given by:  parent Patient understanding: patient states understanding of the procedure being performed Patient identity confirmed: arm band Local anesthesia used: no Patient sedated: no Patient tolerance: Patient tolerated the procedure well with no immediate complications. Comments: 35 French G-tube was replaced here with no significant difficulties.   (including critical care time)  Labs Reviewed - No data to display No results found.   No diagnosis found.    MDM  Patient had his PEG tube replaced here without any significant difficulty.  An x-ray was completed to demonstrate appropriate placement and I did visualize the x-ray myself  with appropriate contrast in the stomach.  I believe patient is safe for discharge home.        Nat Christen, MD 02/18/12 [phone]

## 2012-02-19 ENCOUNTER — Telehealth: Payer: Self-pay | Admitting: Family Medicine

## 2012-02-19 MED ORDER — OLANZAPINE 5 MG PO TABS: 5.0000 mg | ORAL_TABLET | Freq: Every day | ORAL | Status: DC

## 2012-02-19 NOTE — Telephone Encounter (Signed)
RX filled in previous note.

## 2012-02-19 NOTE — Telephone Encounter (Signed)
Seen on 04/07/11 and noted to come back in one year.  RX sent until then.

## 2012-02-19 NOTE — Telephone Encounter (Signed)
refill Olanzapine 5MG  Tablet Qty 90 Last filled 03.19.13 Take 1-tablet at bedtime Last OV 10.30.12

## 2012-03-14 DIAGNOSIS — Z0279 Encounter for issue of other medical certificate: Secondary | ICD-10-CM

## 2012-03-15 ENCOUNTER — Telehealth: Payer: Self-pay | Admitting: Family Medicine

## 2012-03-15 MED ORDER — DIVALPROEX SODIUM 125 MG PO CPSP: ORAL_CAPSULE | ORAL | Status: DC

## 2012-03-15 NOTE — Telephone Encounter (Signed)
Refill with 5 refills 

## 2012-03-15 NOTE — Telephone Encounter (Signed)
Last seen 09/01/11 and filled 12/08/11 with 2 refills.     Please advise     KP

## 2012-03-15 NOTE — Telephone Encounter (Signed)
Rx sent 

## 2012-03-15 NOTE — Telephone Encounter (Signed)
Refill: Depakote 125mg  sprinkle #180. Take 2 capsules in the morning and 4 capsules every evening via g-tube. Brand name medically necessary. Last fill 02-18-12

## 2012-03-24 DIAGNOSIS — D485 Neoplasm of uncertain behavior of skin: Secondary | ICD-10-CM | POA: Diagnosis not present

## 2012-03-24 DIAGNOSIS — L708 Other acne: Secondary | ICD-10-CM | POA: Diagnosis not present

## 2012-03-24 DIAGNOSIS — L259 Unspecified contact dermatitis, unspecified cause: Secondary | ICD-10-CM | POA: Diagnosis not present

## 2012-05-03 ENCOUNTER — Ambulatory Visit (INDEPENDENT_AMBULATORY_CARE_PROVIDER_SITE_OTHER): Payer: Managed Care, Other (non HMO) | Admitting: Family Medicine

## 2012-05-03 ENCOUNTER — Encounter: Payer: Self-pay | Admitting: Family Medicine

## 2012-05-03 VITALS — BP 118/74 | HR 96 | Temp 98.9°F

## 2012-05-03 DIAGNOSIS — R51 Headache: Secondary | ICD-10-CM | POA: Diagnosis not present

## 2012-05-03 DIAGNOSIS — T17308A Unspecified foreign body in larynx causing other injury, initial encounter: Secondary | ICD-10-CM | POA: Diagnosis not present

## 2012-05-03 DIAGNOSIS — M62838 Other muscle spasm: Secondary | ICD-10-CM

## 2012-05-03 DIAGNOSIS — R519 Headache, unspecified: Secondary | ICD-10-CM

## 2012-05-03 DIAGNOSIS — F419 Anxiety disorder, unspecified: Secondary | ICD-10-CM

## 2012-05-03 DIAGNOSIS — IMO0002 Reserved for concepts with insufficient information to code with codable children: Secondary | ICD-10-CM | POA: Diagnosis not present

## 2012-05-03 DIAGNOSIS — G809 Cerebral palsy, unspecified: Secondary | ICD-10-CM

## 2012-05-03 MED ORDER — ACETAMINOPHEN 500 MG/5ML PO LIQD: ORAL | Status: DC

## 2012-05-03 MED ORDER — DIAZEPAM 5 MG PO TABS: 5.0000 mg | ORAL_TABLET | ORAL | Status: DC

## 2012-05-03 NOTE — Progress Notes (Signed)
  Subjective:    [name] is a 26 y.o. male who presents for evaluation of headache. Symptoms began about several weeks ago. Generally, the headaches last about several hours and occur several times per week. The headaches do not seem to be related to any time of the day. The headaches are usually dull and poorly described and are located in top of head.  The patient rates his most severe headaches a 5 on a scale from 1 to 10. Recently, the headaches have been increasing in frequency. Work attendance or other daily activities are not affected by the headaches. Precipitating factors include: none which have been determined. The headaches are usually not preceded by an aura. Associated neurologic symptoms: decreased physical activity and vision problems. The patient denies dizziness, muscle weakness and numbness of extremities. Home treatment has included acetaminophen, darkening the room and resting with marked improvement. Other history includes: cp and muscle spasms and ? TMJ. Family history includes no known family members with significant headaches. Pt is also having swallowing difficulties again and mom is asking to go back to neuro rehab. The following portions of the patient's history were reviewed and updated as appropriate: allergies, current medications, past family history, past medical history, past social history, past surgical history and problem list.  Review of Systems Pertinent items are noted in HPI.    Objective:    BP 118/74  Pulse 96  Temp 98.9 F (37.2 C) (Oral)  SpO2 98% General appearance: alert, cooperative, appears stated age and no distress Head: Normocephalic, without obvious abnormality, + tenderness over TMJ with muscle spasms Eyes: conjunctivae/corneas clear. PERRL, EOM's intact. Fundi benign.    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 Episodic therapy: liquid tylenol due to low frequency of pain. Side effect profile discussed in detail. Asked to keep headache diary. Patient reassured that neurodiagnostic workup not indicated from benign H&P. refill valium for muscle spasms and rx adult liquid tylenol

## 2012-05-03 NOTE — Patient Instructions (Signed)
Headache Headaches are caused by many different problems. Most commonly, headache is caused by muscle tension from an injury, fatigue, or emotional upset. Excessive muscle contractions in the scalp and neck result in a headache that often feels like a tight band around the head. Tension headaches often have areas of tenderness over the scalp and the back of the neck. These headaches may last for hours, days, or longer, and some may contribute to migraines in those who have migraine problems. Migraines usually cause a throbbing headache, which is made worse by activity. Sometimes only one side of the head hurts. Nausea, vomiting, eye pain, and avoidance of food are common with migraines. Visual symptoms such as light sensitivity, blind spots, or flashing lights may also occur. Loud noises may worsen migraine headaches. Many factors may cause migraine headaches:  Emotional stress, lack of sleep, and menstrual periods.   Alcohol and some drugs (such as birth control pills).   Diet factors (fasting, caffeine, food preservatives, chocolate).   Environmental factors (weather changes, bright lights, odors, smoke).  Other causes of headaches include minor injuries to the head. Arthritis in the neck; problems with the jaw, eyes, ears, or nose are also causes of headaches. Allergies, drugs, alcohol, and exposure to smoke can also cause moderate headaches. Rebound headaches can occur if someone uses pain medications for a long period of time and then stops. Less commonly, blood vessel problems in the neck and brain (including stroke) can cause various types of headache. Treatment of headaches includes medicines for pain and relaxation. Ice packs or heat applied to the back of the head and neck help some people. Massaging the shoulders, neck and scalp are often very useful. Relaxation techniques and stretching can help prevent these headaches. Avoid alcohol and cigarette smoking as these tend to make headaches  worse. Please see your caregiver if your headache is not better in 2 days.  SEEK IMMEDIATE MEDICAL CARE IF:   You develop a high fever, chills, or repeated vomiting.   You faint or have difficulty with vision.   You develop unusual numbness or weakness of your arms or legs.   Relief of pain is inadequate with medication, or you develop severe pain.   You develop confusion, or neck stiffness.   You have a worsening of a headache or do not obtain relief.  Document Released: 10/19/2005 Document Revised: 10/08/2011 Document Reviewed: 04/14/2007 [employer] Patient Information 2012 [employer]. 

## 2012-05-04 ENCOUNTER — Ambulatory Visit (INDEPENDENT_AMBULATORY_CARE_PROVIDER_SITE_OTHER): Payer: Medicare Other | Admitting: Family Medicine

## 2012-05-04 DIAGNOSIS — IMO0002 Reserved for concepts with insufficient information to code with codable children: Secondary | ICD-10-CM

## 2012-05-04 DIAGNOSIS — T17308A Unspecified foreign body in larynx causing other injury, initial encounter: Secondary | ICD-10-CM

## 2012-05-04 NOTE — Progress Notes (Signed)
  Subjective:    Patient ID: [name], male    DOB: [birth year], 26 y.o.   MRN: [id number]  HPI    Review of Systems     Objective:   Physical Exam  ,,,,,,      Assessment & Plan:

## 2012-05-10 ENCOUNTER — Ambulatory Visit: Payer: Medicare Other | Admitting: Family Medicine

## 2012-05-17 ENCOUNTER — Other Ambulatory Visit: Payer: Self-pay | Admitting: Family Medicine

## 2012-05-17 MED ORDER — OLANZAPINE 5 MG PO TABS: 5.0000 mg | ORAL_TABLET | Freq: Every day | ORAL | Status: DC

## 2012-05-17 NOTE — Telephone Encounter (Signed)
OLANZAPINE 5 MG TABLET QTY:30  REFILLS: 2 TAKE 1 TABLET BY MOUTH AT BEDTIME

## 2012-06-03 ENCOUNTER — Encounter: Payer: Self-pay | Admitting: Family Medicine

## 2012-06-03 ENCOUNTER — Ambulatory Visit (INDEPENDENT_AMBULATORY_CARE_PROVIDER_SITE_OTHER): Payer: Managed Care, Other (non HMO) | Admitting: Family Medicine

## 2012-06-03 VITALS — BP 100/70 | HR 97 | Temp 98.4°F

## 2012-06-03 DIAGNOSIS — J40 Bronchitis, not specified as acute or chronic: Secondary | ICD-10-CM | POA: Diagnosis not present

## 2012-06-03 MED ORDER — AZITHROMYCIN 250 MG PO TABS: ORAL_TABLET | ORAL | Status: AC

## 2012-06-03 MED ORDER — PREDNISONE 10 MG PO TABS: ORAL_TABLET | ORAL | Status: DC

## 2012-06-03 NOTE — Progress Notes (Signed)
  Subjective:     [name] is a 26 y.o. male here for evaluation of a cough. Onset of symptoms was 5 days ago. Symptoms have been gradually worsening since that time. The cough is productive and is aggravated by infection and reclining position. Associated symptoms include: fever, shortness of breath and sputum production. Patient does not have a history of asthma. Patient does not have a history of environmental allergens. Patient has not traveled recently. Patient does not have a history of smoking. Patient has not had a previous chest x-ray. Patient has not had a PPD done.  Pt with hx CP and taking mucinex.   The following portions of the patient's history were reviewed and updated as appropriate: allergies, current medications, past family history, past medical history, past social history, past surgical history and problem list.  Review of Systems Pertinent items are noted in HPI.    Objective:    Oxygen saturation 97% on room air BP 100/70  Pulse 97  Temp 98.4 F (36.9 C) (Oral)  SpO2 97% General appearance: alert, cooperative, appears stated age and no distress Ears: normal TM's and external ear canals both ears Nose: Nares normal. Septum midline. Mucosa normal. No drainage or sinus tenderness. Throat: lips, mucosa, and tongue normal; teeth and gums normal Lungs: rhonchi bilaterally Heart: S1, S2 normal    Assessment:    Acute Bronchitis    Plan:    Antibiotics per medication orders. Call if shortness of breath worsens, blood in sputum, change in character of cough, development of fever or chills, inability to maintain nutrition and hydration. Avoid exposure to tobacco smoke and fumes. mucinex for cough

## 2012-06-03 NOTE — Patient Instructions (Addendum)

## 2012-06-06 ENCOUNTER — Other Ambulatory Visit: Payer: Self-pay | Admitting: Family Medicine

## 2012-06-06 ENCOUNTER — Ambulatory Visit (HOSPITAL_BASED_OUTPATIENT_CLINIC_OR_DEPARTMENT_OTHER)
Admission: RE | Admit: 2012-06-06 | Discharge: 2012-06-06 | Disposition: A | Discharge status: Home / Self Care | Payer: Managed Care, Other (non HMO) | Source: Ambulatory Visit | Attending: Family Medicine | Admitting: Family Medicine

## 2012-06-06 ENCOUNTER — Telehealth: Payer: Self-pay | Admitting: *Deleted

## 2012-06-06 DIAGNOSIS — R05 Cough: Secondary | ICD-10-CM

## 2012-06-06 DIAGNOSIS — R059 Cough, unspecified: Secondary | ICD-10-CM

## 2012-06-06 NOTE — Telephone Encounter (Signed)
Pt mother left message on triage line to advise that if her son is no better after his Friday OV to call back to advise, pt mother notes he is still coughing all the time, notes pt is no worse however no better, please advise next steps

## 2012-06-06 NOTE — Telephone Encounter (Signed)
Check chest xray at [facility]

## 2012-06-06 NOTE — Telephone Encounter (Signed)
Returned your call.

## 2012-06-06 NOTE — Telephone Encounter (Signed)
Called pt mother to advise order for chest x ray has been placed in the chart or [facility], once the results come back in we will call with next steps, pt will go today and await reading and next steps to be advised, pt mom understood all instructions, received incoming call concerning order per noted one view chest x-ray, wanted to clarify if we need a chest x ray with 2 views advised per pt in wheelchair and not sure if 2 views is possible, radiology rep noted she will try to get the 2 view if she can, MD Lowne requested a 2 view order to be changed, will await call back if the order needs to be changed.  Placed order for 2 view, discontinued one view chest x-ray

## 2012-06-07 ENCOUNTER — Other Ambulatory Visit: Payer: Self-pay | Admitting: Family Medicine

## 2012-06-07 DIAGNOSIS — J40 Bronchitis, not specified as acute or chronic: Secondary | ICD-10-CM

## 2012-06-07 MED ORDER — MOXIFLOXACIN HCL 400 MG PO TABS: 400.0000 mg | ORAL_TABLET | Freq: Every day | ORAL | Status: AC

## 2012-06-07 NOTE — Telephone Encounter (Signed)
Noted pt treated by MD Lowne per cough concerns.

## 2012-06-08 ENCOUNTER — Telehealth: Payer: Self-pay | Admitting: Family Medicine

## 2012-06-08 NOTE — Telephone Encounter (Signed)
Caller: Linda/Mother; PCP: Lelon Perla.; CB#: [phone]; Call regarding Cough/Congestion; Calelr relates patient started on Avelox and she asks if the office will contact her about his progress as she feels he would benefit from steroid injection as she has improved significantly since she received steroids.  Caller relates patient has been sick for 9 days, increased sleep.  Afebrile/tactile. Cough interferes with sleep.  Advised see in 24 hours per Cough protocol. Caller states plan to follow up with office in am to schedule appointment with Dr. Laury Axon as her afternoon appointments are currently blocked.

## 2012-06-08 NOTE — Telephone Encounter (Signed)
Discussed with mother and Dr.Tabori is willing to call in a cough medication, she said she would rather come in an get a shot of prednisone for him because it worked great on her. Apt scheduled tomorrow on dod schedule     KP

## 2012-06-09 ENCOUNTER — Ambulatory Visit (INDEPENDENT_AMBULATORY_CARE_PROVIDER_SITE_OTHER): Payer: Managed Care, Other (non HMO) | Admitting: Family Medicine

## 2012-06-09 ENCOUNTER — Encounter: Payer: Self-pay | Admitting: Family Medicine

## 2012-06-09 VITALS — BP 114/66 | HR 100 | Temp 98.3°F

## 2012-06-09 DIAGNOSIS — J40 Bronchitis, not specified as acute or chronic: Secondary | ICD-10-CM

## 2012-06-09 MED ORDER — METHYLPREDNISOLONE ACETATE 80 MG/ML IJ SUSP
80.0000 mg | Freq: Once | INTRAMUSCULAR | Status: AC
  Administered 2012-06-09: 80 mg via INTRAMUSCULAR

## 2012-06-09 MED ORDER — ALBUTEROL SULFATE (5 MG/ML) 0.5% IN NEBU
2.5000 mg | INHALATION_SOLUTION | Freq: Once | RESPIRATORY_TRACT | Status: AC
  Administered 2012-06-09: 2.5 mg via RESPIRATORY_TRACT

## 2012-06-09 NOTE — Progress Notes (Signed)
  Subjective:     [name] is a 26 y.o. male here with hx CP  for evaluation of a cough.  Mom is present.  Onset of symptoms was a few weeks ago. Symptoms have been gradually improving since that time. The cough is productive and is aggravated by infection. Associated symptoms include: shortness of breath, sputum production and wheezing. Patient does not have a history of asthma. Patient does not have a history of environmental allergens. Patient has not traveled recently. Patient does not have a history of smoking. Patient has had a previous chest x-ray. Patient has not had a PPD done.  The following portions of the patient's history were reviewed and updated as appropriate: allergies, current medications, past family history, past medical history, past social history, past surgical history and problem list.  Review of Systems Pertinent items are noted in HPI.    Objective:    Oxygen saturation 94% on room air BP 114/66  Pulse 100  Temp 98.3 F (36.8 C) (Oral)  SpO2 94% General appearance: alert, cooperative and mild distress Lungs: wheezes bilaterally    Assessment:    Acute Bronchitis    Plan:    Antibiotics per medication orders. Antitussives per medication orders. Avoid exposure to tobacco smoke and fumes. Call if shortness of breath worsens, blood in sputum, change in character of cough, development of fever or chills, inability to maintain nutrition and hydration. Avoid exposure to tobacco smoke and fumes. finish abx and prednisone

## 2012-06-10 ENCOUNTER — Telehealth: Payer: Self-pay | Admitting: Family Medicine

## 2012-06-10 NOTE — Telephone Encounter (Signed)
In reference to Speech Pathology referral entered on 05/04/2012, for choking, after numerous attempts to get patient scheduled and reach him or his mother, I had finally spoke with patient's mother, [name].  I gave her the Neuro/Speech phone # to call & schedule patient, but she stated he has not been choking now, but will let us know if he starts again.  Referral declined.

## 2012-08-17 ENCOUNTER — Other Ambulatory Visit: Payer: Self-pay | Admitting: Family Medicine

## 2012-08-17 MED ORDER — OLANZAPINE 5 MG PO TABS: 5.0000 mg | ORAL_TABLET | Freq: Every day | ORAL | Status: DC

## 2012-08-17 NOTE — Telephone Encounter (Signed)
refill olanzapine 5mg  tablet #30 wt/2-refills Take 1 tablet at bedtime -- last fill 9.13.13  last ov 8.8.13 acute

## 2012-08-17 NOTE — Telephone Encounter (Signed)
Rx sent.    MW 

## 2012-09-15 ENCOUNTER — Other Ambulatory Visit: Payer: Self-pay | Admitting: Family Medicine

## 2012-09-15 NOTE — Telephone Encounter (Signed)
Rx sent.    MW 

## 2012-09-23 IMAGING — CR DG ABDOMEN 1V
1 series · 1 of 1 positions shown · non-contrast
Comparison: 06/22/2011.

CLINICAL DATA: Peg tube placement..

ABDOMEN - 1 VIEW

[t abdomen supine]
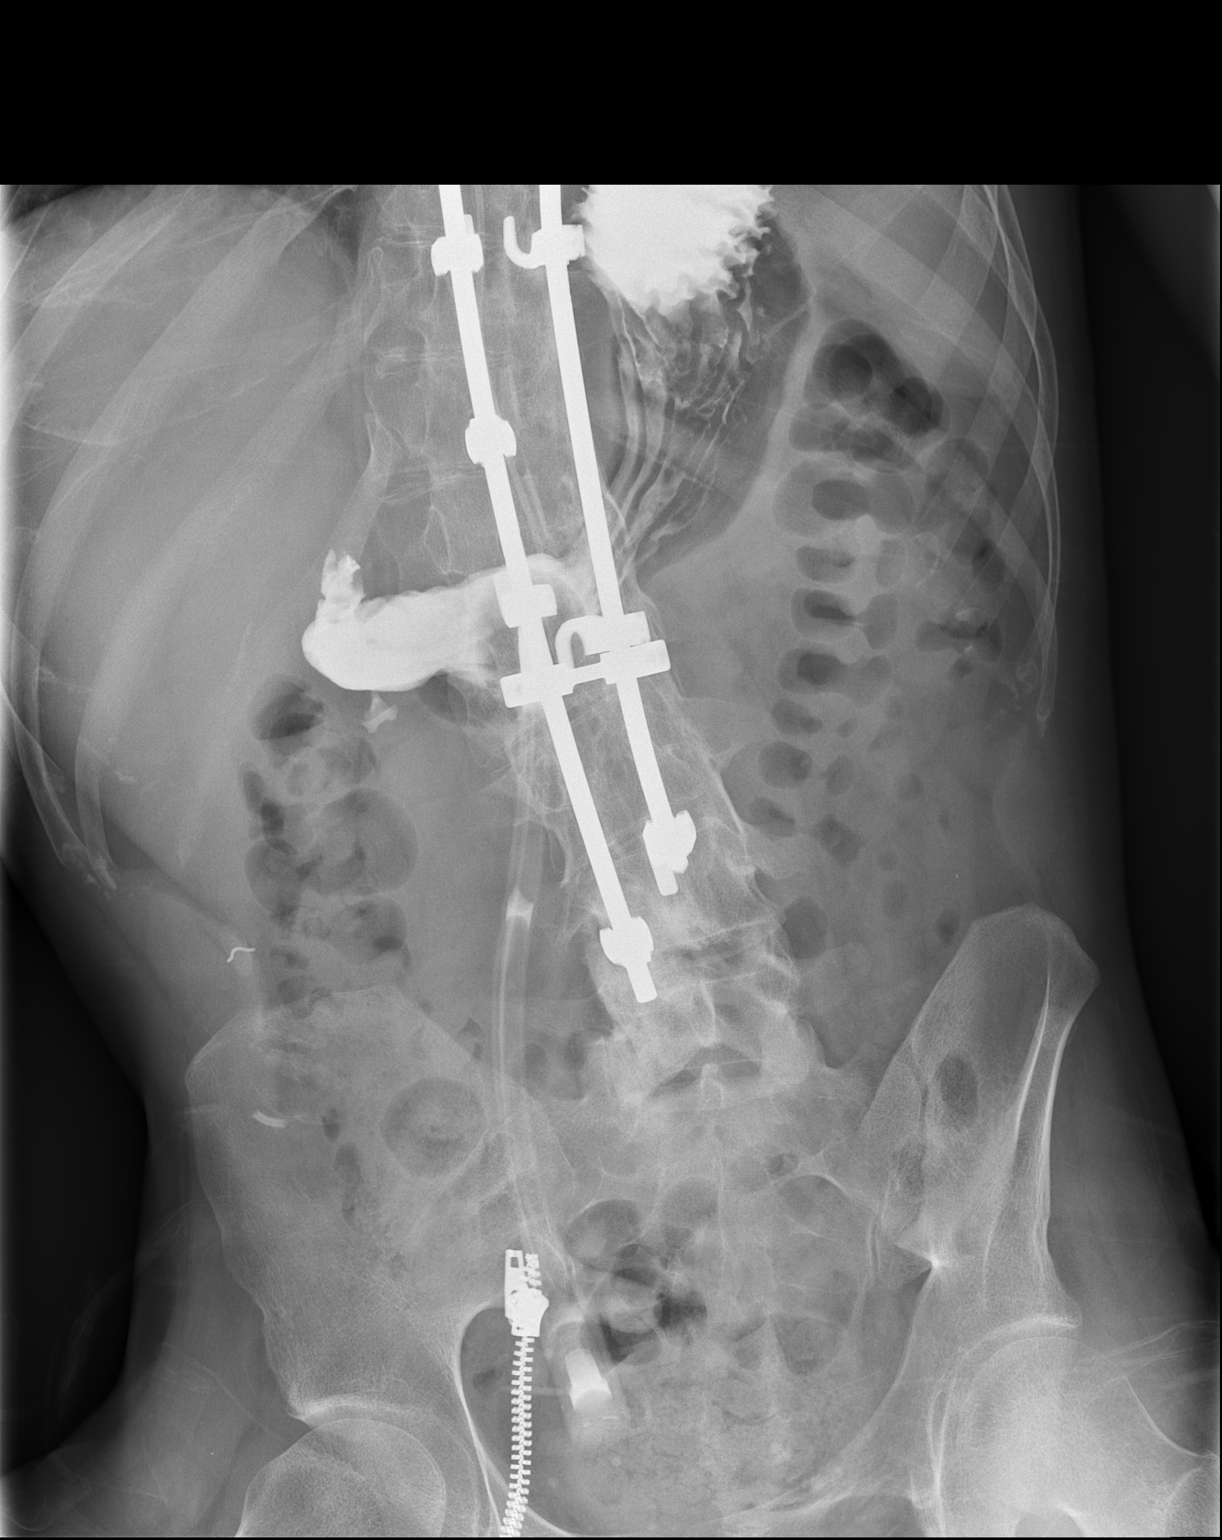

[1 of 1 positions shown; findings below may reference images not displayed]

FINDINGS: Contrast injected through the peg tube is contained in
the stomach and proximal duodenum.  There is no evidence for
significant leak.  The bowel gas pattern is unremarkable.  Spinal
hardware is stable.
IMPRESSION: 1.  Contrast injected through the peg tube is contained within the
stomach.
2.  No acute abnormality.

## 2012-10-06 ENCOUNTER — Telehealth: Payer: Self-pay | Admitting: Internal Medicine

## 2012-10-06 NOTE — Telephone Encounter (Signed)
[name] reports that her son is

## 2012-10-06 NOTE — Telephone Encounter (Signed)
[name] called back to discuss her sons PEG tube.  He is complaining of abdominal pain since Tuesday/.  She sees no redness, drainage, or irritation around the tube.  They started him on carafate again yesterday.  The mother is wondering what can be done, and would like to have Dr. Juanda Chance consider trading the tube out for a [facility] button after the first of the year.  The mom is offered an appt with Willette Cluster RNP tomorrow, she wants to wait to tomorrow to decide.  She will call back if they want an appt for tomorrow or next week with an extender.

## 2012-10-06 NOTE — Telephone Encounter (Signed)
Left message for patient to call back  

## 2012-10-06 NOTE — Telephone Encounter (Signed)
We will have to see him in the office to assess the size of new PEG tube which will have to be ordered.

## 2012-10-07 ENCOUNTER — Ambulatory Visit (INDEPENDENT_AMBULATORY_CARE_PROVIDER_SITE_OTHER): Payer: Managed Care, Other (non HMO) | Admitting: Nurse Practitioner

## 2012-10-07 ENCOUNTER — Other Ambulatory Visit (INDEPENDENT_AMBULATORY_CARE_PROVIDER_SITE_OTHER): Payer: Managed Care, Other (non HMO)

## 2012-10-07 ENCOUNTER — Encounter: Payer: Self-pay | Admitting: Nurse Practitioner

## 2012-10-07 VITALS — BP 122/72 | HR 70

## 2012-10-07 DIAGNOSIS — R109 Unspecified abdominal pain: Secondary | ICD-10-CM

## 2012-10-07 DIAGNOSIS — G809 Cerebral palsy, unspecified: Secondary | ICD-10-CM

## 2012-10-07 LAB — COMPREHENSIVE METABOLIC PANEL
ALT: 13 U/L (ref 0–53)
AST: 17 U/L (ref 0–37)
Albumin: 4 g/dL (ref 3.5–5.2)
Alkaline Phosphatase: 59 U/L (ref 39–117)
BUN: 9 mg/dL (ref 6–23)
CO2: 29 mEq/L (ref 19–32)
Calcium: 9.3 mg/dL (ref 8.4–10.5)
Chloride: 102 mEq/L (ref 96–112)
Creatinine, Ser: 0.5 mg/dL (ref 0.4–1.5)
GFR: 209.59 mL/min (ref >60.00)
Glucose, Bld: 110 mg/dL — ABNORMAL HIGH (ref 70–99)
Potassium: 3.9 mEq/L (ref 3.5–5.1)
Sodium: 139 mEq/L (ref 135–145)
Total Bilirubin: 0.7 mg/dL (ref 0.3–1.2)
Total Protein: 6.8 g/dL (ref 6.0–8.3)

## 2012-10-07 LAB — CBC WITH DIFFERENTIAL/PLATELET
Basophils Absolute: 0 10*3/uL (ref 0.0–0.1)
Basophils Relative: 0.4 % (ref 0.0–3.0)
Eosinophils Absolute: 0 10*3/uL (ref 0.0–0.7)
Eosinophils Relative: 0.6 % (ref 0.0–5.0)
HCT: 42.8 % (ref 39.0–52.0)
Hemoglobin: 14 g/dL (ref 13.0–17.0)
Lymphocytes Relative: 41.9 % (ref 12.0–46.0)
Lymphs Abs: 1.6 10*3/uL (ref 0.7–4.0)
MCHC: 32.8 g/dL (ref 30.0–36.0)
MCV: 89.6 fl (ref 78.0–100.0)
Monocytes Absolute: 0.2 10*3/uL (ref 0.1–1.0)
Monocytes Relative: 5.3 % (ref 3.0–12.0)
Neutro Abs: 2 10*3/uL (ref 1.4–7.7)
Neutrophils Relative %: 51.8 % (ref 43.0–77.0)
Platelets: 206 10*3/uL (ref 150.0–400.0)
RBC: 4.78 Mil/uL (ref 4.22–5.81)
RDW: 13.8 % (ref 11.5–14.6)
WBC: 3.9 10*3/uL — ABNORMAL LOW (ref 4.5–10.5)

## 2012-10-07 NOTE — Progress Notes (Signed)
Reviewed and agree. I guess his PEG does not need to be changed.

## 2012-10-07 NOTE — Patient Instructions (Addendum)
Your physician has requested that you go to the basement for the following lab work before leaving today: CBC, CMET  We will call you early next week to schedule a follow up appointment with Dr. Juanda Chance.  If you do not here from [place] please call [phone]

## 2012-10-07 NOTE — Telephone Encounter (Signed)
Spoke with patients mother and he is seeing Willette Cluster, NP today.

## 2012-10-07 NOTE — Progress Notes (Signed)
10/07/2012 [name] [id number] 08/16/1986   History of Present Illness:  Patient is a 26 year old male with cerebral palsy. He is known to Dr. Juanda Chance for history of dysphasia requiring PEG tube placement.  He had a PEG tube replacement in April of this year.  [name] provides the history today. [name] has been tolerating solid food, mainly mashed up vegetables, potatoes, yogurt, et Karie Soda. His PEG tube is mainly used for hydration and medications. [name] brings him in today for assessment of PEG tube. Patient has been complaining of pain at PEG tube site. Complaints of pain not associated with use of PEG itself. He's been somewhat lethargic with a diminished appetite. No fevers. No cough. Mother reports normal colored urine without odor.  Current Medications, Allergies, Past Medical History, Past Surgical History, Family History and Social History were reviewed in [employer] record.   Physical Exam: General: Small white male in wheelchair in no acute distress  Eyes:  sclerae anicteric, conjunctiva pink  Ears: Normal auditory acuity Lungs: Clear throughout to auscultation Heart: Regular rate and rhythm Abdomen: Soft, non tender and non distended. No masses, no hepatomegaly. Normal bowel sounds. Gastrostomy with circumferential erythema but no swelling or drainage. There is plenty of play between external bumper and skin. Tube rotates 360 without difficulty or suggestion of pain. Musculoskeletal: Multiple deformities secondary to cerebral palsy  Extremities: No edema  Neurological: Alert    Assessment and Recommendations:   Abdominal discomfort at PEG site. No tenderness on exam. PEG site looks okay except for some surrounding erythema which mother states is chronic and no worse than usual. Tube easily turns 360 . Etiology of his abdominal discomfort is unclear. Patient has recently become somewhat lethargic with diminished appetite. Perhaps  these symptoms are manifestations of an underlying problem unrelated to PEG. Will check some basic labs. Will add daily Prilosec for now (samples given), perhaps gastric fluid causing some irritation at the PEG site?? I asked mother to monitor for him for fevers. If symptoms persist will consider gastrograffin study of PEG. Mother will call with condition update in a few days.   2. Diminished appetite.

## 2012-10-13 ENCOUNTER — Encounter (HOSPITAL_COMMUNITY): Payer: Self-pay | Admitting: Emergency Medicine

## 2012-10-13 ENCOUNTER — Telehealth: Payer: Self-pay | Admitting: *Deleted

## 2012-10-13 ENCOUNTER — Emergency Department (HOSPITAL_COMMUNITY)
Admission: EM | Admit: 2012-10-13 | Discharge: 2012-10-13 | Disposition: A | Discharge status: Home / Self Care | Payer: Managed Care, Other (non HMO) | Attending: Emergency Medicine | Admitting: Emergency Medicine

## 2012-10-13 DIAGNOSIS — E0590 Thyrotoxicosis, unspecified without thyrotoxic crisis or storm: Secondary | ICD-10-CM | POA: Insufficient documentation

## 2012-10-13 DIAGNOSIS — G809 Cerebral palsy, unspecified: Secondary | ICD-10-CM | POA: Insufficient documentation

## 2012-10-13 DIAGNOSIS — T85528A Displacement of other gastrointestinal prosthetic devices, implants and grafts, initial encounter: Secondary | ICD-10-CM

## 2012-10-13 DIAGNOSIS — Z8679 Personal history of other diseases of the circulatory system: Secondary | ICD-10-CM | POA: Insufficient documentation

## 2012-10-13 DIAGNOSIS — K9423 Gastrostomy malfunction: Secondary | ICD-10-CM | POA: Insufficient documentation

## 2012-10-13 DIAGNOSIS — Z8719 Personal history of other diseases of the digestive system: Secondary | ICD-10-CM | POA: Insufficient documentation

## 2012-10-13 DIAGNOSIS — Z79899 Other long term (current) drug therapy: Secondary | ICD-10-CM | POA: Insufficient documentation

## 2012-10-13 DIAGNOSIS — Z8659 Personal history of other mental and behavioral disorders: Secondary | ICD-10-CM | POA: Insufficient documentation

## 2012-10-13 DIAGNOSIS — R159 Full incontinence of feces: Secondary | ICD-10-CM | POA: Insufficient documentation

## 2012-10-13 DIAGNOSIS — Y833 Surgical operation with formation of external stoma as the cause of abnormal reaction of the patient, or of later complication, without mention of misadventure at the time of the procedure: Secondary | ICD-10-CM | POA: Insufficient documentation

## 2012-10-13 NOTE — ED Notes (Signed)
Patient's G-tube came out today while undressing.  Patient has been c/o belly hurting x 10 days.  Patient has cerebral palsy and is wheelchair bound.  [name] denies N/V/D.

## 2012-10-13 NOTE — ED Notes (Addendum)
MD,Steinl unable to place 12 FR foley instead placed 14 FR. Foley.

## 2012-10-13 NOTE — ED Notes (Signed)
[id number]> Expected date:<BR> Expected time:<BR> Means of arrival:<BR> Comments:<BR> Triage 1

## 2012-10-13 NOTE — ED Provider Notes (Signed)
History     CSN: [id number]  Arrival date & time 10/13/12  1801   First MD Initiated Contact with Patient 10/13/12 1854      Chief Complaint  Patient presents with  . Illegal value: [    G-tube came out     HPI The patient presented to the emergency room because his G-tube fell out while undressing today. Patient has cerebral palsy and is wheelchair-bound. The mom states she's also been complaining of his stomach hurting him for the last 10 days. Any trouble with any nausea or vomiting. Mom noticed that when she looked at the G-tube the balloon had fallen apart. Mom tried to call his gastroenterologist but was unable to be seen. He was instructed to come to the emergency room. Past Medical History  Diagnosis Date  . Cerebral palsy   . GERD (gastroesophageal reflux disease)   . Anxiety   . Depression   . Thyroid disease     hyper  . Incontinence of feces   . Palpitations   . Esophagitis     Past Surgical History  Procedure Date  . Spine surgery ,11/20/2010, 2011  . Eye surgery   . Ears tubes   . Hamstring released   . Baclofen trial   . Baslofen pump implant   . Spinal fusion   . G-tube insert August 2006  . Spinal fusioncorrect 106 degree kyphosis   . Spinal fusion to correct 70 degree kyphosis   . Tonsillectomy   . Peg placement 10/21/2011    Procedure: PERCUTANEOUS ENDOSCOPIC GASTROSTOMY (PEG) REPLACEMENT;  Surgeon: Hart Carwin, MD;  Location: [facility];  Service: Endoscopy;  Laterality: N/A;    Family History  Problem Relation Age of Onset  . Asthma Mother   . Hyperlipidemia Mother   . Cancer Maternal Grandmother     breast  . Cancer Maternal Grandfather     prostate  . Heart disease Paternal Grandfather     History  Substance Use Topics  . Smoking status: Never Smoker   . Smokeless tobacco: Never Used  . Alcohol Use: No      Review of Systems  All other systems reviewed and are negative.    Allergies  Sulfamethoxazole w-trimethoprim  and Sulfonamide derivatives  Home Medications   Current Outpatient Rx  Name  Route  Sig  Dispense  Refill  . ACETAMINOPHEN 500 MG/5ML PO LIQD   Oral   Take 10 mLs by mouth as needed. In tube every 6 hours as needed         . CIMETIDINE HCL 300 MG/5ML PO SOLN      300 mg. Take 5 ml by PEG once daily         . DIAZEPAM 5 MG PO TABS   Tube   Give 5 mg by tube as directed. 1 tablet in the evening and every 8 hours prn         . DIVALPROEX SODIUM 125 MG PO CPSP      250-500 mg 2 (two) times daily. Take 2 capsules in the morning and 4 capsules in the evening via G-tube         . DOXYCYCLINE HYCLATE 100 MG PO TBEC      100 mg 2 (two) times daily. Via G-TUBE         . NONFORMULARY OR COMPOUNDED ITEM      Bath lift chair   1 each   0   . OLANZAPINE 5 MG PO TABS  5 mg at bedtime. VIA G-TUBE         . PAROXETINE HCL 30 MG PO TABS   Oral   Take 1 tablet (30 mg total) by mouth daily.   30 tablet   11   . POLYETHYLENE GLYCOL 3350 PO PACK   Oral   Take 17 g by mouth as directed.           . SUCRALFATE 1 GM/10ML PO SUSP      Take 2 teaspoons twice daily per PEG         . TRETINOIN 0.1 % EX CREA   Topical   Apply topically at bedtime.           . ALBUTEROL SULFATE (2.5 MG/3ML) 0.083% IN NEBU   Nebulization   Take 2.5 mg by nebulization every 6 (six) hours as needed.             BP 107/55  Pulse 92  Temp 98.2 F (36.8 C) (Axillary)  Resp 18  SpO2 99%  Physical Exam  Nursing note and vitals reviewed. Constitutional: He appears well-developed and well-nourished. No distress.  HENT:  Head: Normocephalic and atraumatic.  Right Ear: External ear normal.  Left Ear: External ear normal.  Eyes: Conjunctivae normal are normal. Right eye exhibits no discharge. Left eye exhibits no discharge. No scleral icterus.  Neck: Neck supple. No tracheal deviation present.  Cardiovascular: Normal rate and regular rhythm.   Pulmonary/Chest: Effort normal.  No stridor. No respiratory distress.  Abdominal: He exhibits no distension. There is no tenderness.       Gastrostomy stoma without erythema or exudate  Musculoskeletal: He exhibits no edema.  Neurological: He is alert. He displays atrophy. He displays no tremor. Cranial nerve deficit: no gross deficits. He exhibits abnormal muscle tone. He displays no seizure activity. Coordination abnormal.  Skin: Skin is warm and dry. No rash noted.  Psychiatric: He has a normal mood and affect.    ED Course  Gastrostomy tube replacement Performed by: Linwood Dibbles R Authorized by: Linwood Dibbles R Consent: Verbal consent obtained. Risks and benefits: risks, benefits and alternatives were discussed Consent given by: parent Time out: Immediately prior to procedure a "time out" was called to verify the correct patient, procedure, equipment, support staff and site/side marked as required. Local anesthesia used: no Patient sedated: no Patient tolerance: Patient tolerated the procedure well with no immediate complications. Comments: Attempted to replace a 22-gauge feeding tube. I was unable to pass the tube. I was unable to successfully place a 14-gauge Foley catheter. Arrangements will be made to have the patient seen in interventional radiology for formal replacement   (including critical care time)  Labs Reviewed - No data to display No results found.   1. Dislodged gastrostomy tube       MDM  Patient tolerated the temporary replacement of a Foley catheter. Findings were discussed with the mother. We'll arrange for outpatient followup       Celene Kras, MD 10/13/12 2052

## 2012-10-13 NOTE — Telephone Encounter (Signed)
Patient's PEG ripped apart when they pulled up his pants. She states part of the balloon is deflated. She will take him to ED to be evaluated. She will call back to schedule OV to have patient "measured for a Mickey button."

## 2012-10-14 ENCOUNTER — Ambulatory Visit (HOSPITAL_COMMUNITY)
Admission: RE | Admit: 2012-10-14 | Discharge: 2012-10-14 | Disposition: A | Discharge status: Home / Self Care | Payer: Managed Care, Other (non HMO) | Source: Ambulatory Visit | Attending: Emergency Medicine | Admitting: Emergency Medicine

## 2012-10-14 DIAGNOSIS — Z431 Encounter for attention to gastrostomy: Secondary | ICD-10-CM | POA: Insufficient documentation

## 2012-10-14 NOTE — Telephone Encounter (Signed)
Per Dr. Juanda Chance, Patient will need to be seen prior to measure for button

## 2012-10-14 NOTE — Telephone Encounter (Signed)
[name] wants patient to be scheduled for a "mickey button". Patient was in ED last night because his feeding tube came out. Please, advise.

## 2012-10-14 NOTE — Telephone Encounter (Signed)
Spoke with patient's mother. She states patient went to the ED to have a tube replacement. In 3 hours the hole had closed up and the ED had problems getting another tube in so they placed a 14 French foley to keep the hole open. Unable to get medications down this tube. He is scheduled to go to IR at 11:00 today to have a more permanent tube placed. She is going to call back to schedule OV in January to see Dr. Juanda Chance and figure out a plan for patient.

## 2012-10-14 NOTE — Procedures (Signed)
Successfully replaced 20Fr balloon retention gastrostomy tube Flushes easily Tol well, ready for use.  [place], [facility]

## 2012-10-14 NOTE — Telephone Encounter (Signed)
Left a message for patient's mother to call me.

## 2012-10-17 ENCOUNTER — Other Ambulatory Visit: Payer: Self-pay | Admitting: Family Medicine

## 2012-10-17 MED ORDER — PAROXETINE HCL 30 MG PO TABS: 30.0000 mg | ORAL_TABLET | Freq: Every day | ORAL | Status: DC

## 2012-10-17 NOTE — Telephone Encounter (Signed)
refill Paroxetine HCL 30MG  Tablet -- Take one tablet daily --last fill 11.14.13 #30 wt/11-refills last ov 8.8.13

## 2012-10-19 ENCOUNTER — Telehealth: Payer: Self-pay | Admitting: Internal Medicine

## 2012-10-19 MED ORDER — OMEPRAZOLE 40 MG PO CPDR: 40.0000 mg | DELAYED_RELEASE_CAPSULE | Freq: Every day | ORAL | Status: DC

## 2012-10-19 MED ORDER — SUCRALFATE 1 G PO TABS: ORAL_TABLET | ORAL | Status: DC

## 2012-10-19 NOTE — Telephone Encounter (Signed)
Please start Carafate 1gm per PEG qid x 1 week, #120, 1 refill then bid, new Prescription Prelosec 40 mg, 1 per PEG daily, #90, 3 refills and let mother call us back on Monday. If pain continues, please orger TUBE STUDY ( barium through PEG)

## 2012-10-19 NOTE — Telephone Encounter (Signed)
Spoke with patient's mother and gave her Dr. Regino Schultze recommendations. Rx sent to pharmacy. She will call Monday with update.

## 2012-10-19 NOTE — Telephone Encounter (Signed)
[name] Bonita Quin calling to report patient is still c/o stomach hurting and his appetite is down. He had a new tube placed last  Friday in IR.(after his old one fell out) He is taking Prilosec daily. He was given samples on OV with Willette Cluster, NP and will run out of this on Friday. He is not taking Cimetidine or Carafate at this time. Area around tube is red but not draining or bleeding. Denies vomiting or fever. Please, advise.

## 2012-11-14 ENCOUNTER — Other Ambulatory Visit: Payer: Self-pay | Admitting: Family Medicine

## 2012-11-16 ENCOUNTER — Telehealth: Payer: Self-pay | Admitting: Family Medicine

## 2012-11-16 NOTE — Telephone Encounter (Signed)
Refill x1  3 refills 

## 2012-11-16 NOTE — Telephone Encounter (Signed)
Refill: Diazepam 5 mg tablet. Take 1 tablet in the evening and every 8 hours as needed. Qty 60. Last fill 10-14-12

## 2012-11-16 NOTE — Telephone Encounter (Signed)
Please advise      KP 

## 2012-11-17 MED ORDER — DIAZEPAM 5 MG PO TABS: ORAL_TABLET | ORAL | Status: DC

## 2012-12-07 ENCOUNTER — Ambulatory Visit (INDEPENDENT_AMBULATORY_CARE_PROVIDER_SITE_OTHER): Payer: Medicare Other | Admitting: Internal Medicine

## 2012-12-07 ENCOUNTER — Encounter: Payer: Self-pay | Admitting: Internal Medicine

## 2012-12-07 VITALS — BP 112/62 | HR 84

## 2012-12-07 DIAGNOSIS — R633 Feeding difficulties, unspecified: Secondary | ICD-10-CM | POA: Diagnosis not present

## 2012-12-07 DIAGNOSIS — G809 Cerebral palsy, unspecified: Secondary | ICD-10-CM

## 2012-12-07 MED ORDER — OMEPRAZOLE 20 MG PO CPDR: DELAYED_RELEASE_CAPSULE | ORAL | Status: DC

## 2012-12-07 MED ORDER — FLUCONAZOLE 100 MG PO TABS: ORAL_TABLET | ORAL | Status: DC

## 2012-12-07 NOTE — Patient Instructions (Addendum)
You will be due for a PEG change in May 2014. We will order a 22 French Gastrostomy Replacement. We will contact you regarding a date and time of procedure once it gets closer to May.  We have sent the following medications to your pharmacy for you to pick up at your convenience: Diflucan (crushed) via PEG once daily x 5 days Prilosec 20 mg (2 tablets) via PEG daily-instead of 40 mg tablets.  CC: Dr Loreen Freud

## 2012-12-07 NOTE — Progress Notes (Signed)
[name] 1986/09/10 MRN [id number]   History of Present Illness:  This is a 27 year old white male with cerebral palsy who is dependent on percutaneous gastrostomy tube feedings since 2006. His gastrostomy has fallen out several times recently and was replaced in December 2013 in radiology. The mother is interested in being able to change the gastrostomy herself. She brought with her [name] bolus gastrostomy feeding instructions which stated that the patient caregiver may change the gastrostomy feeding tube themselves provided they have been properly obtained and had been given permission from the physician to do so. Her mother is interested in changing it at home because of the expense of having to go to the hospital which usually cost several thousand dollars. She has researched the subject and has a very reasonable approach to this problem. Currently, he has a [name]. In the past, he had [name].    Past Medical History  Diagnosis Date  . Cerebral palsy   . GERD (gastroesophageal reflux disease)   . Anxiety   . Depression   . Thyroid disease     hyper  . Incontinence of feces   . Palpitations   . Esophagitis    Past Surgical History  Procedure Date  . Spine surgery ,11/20/2010, 2011  . Eye surgery   . Ears tubes   . Hamstring released   . Baclofen trial   . Baslofen pump implant   . Spinal fusion   . G-tube insert August 2006  . Spinal fusioncorrect 106 degree kyphosis   . Spinal fusion to correct 70 degree kyphosis   . Tonsillectomy   . Peg placement 10/21/2011    Procedure: PERCUTANEOUS ENDOSCOPIC GASTROSTOMY (PEG) REPLACEMENT;  Surgeon: Hart Carwin, MD;  Location: [facility];  Service: Endoscopy;  Laterality: N/A;    reports that he has never smoked. He has never used smokeless tobacco. He reports that he does not drink alcohol or use illicit drugs. family history includes Asthma in his mother; Cancer in his  maternal grandfather and maternal grandmother; Heart disease in his paternal grandfather; and Hyperlipidemia in his mother. Allergies  Allergen Reactions  . Sulfamethoxazole W-Trimethoprim   . Sulfonamide Derivatives Rash        Review of Systems: No fever chest pain abdominal pain  The remainder of the 10 point ROS is negative except as outlined in H&P   Physical Exam: General appearance  cerebral palsy deformities of upper extremities and neck, in no distress. Eyes- non icteric. HEENT nontraumatic, normocephalic. Mouth no lesions, tongue papillated, no cheilosis. Neck supple without adenopathy, thyroid not enlarged, no carotid bruits, no JVD. Lungs Clear to auscultation bilaterally. Cor normal S1, normal S2, regular rhythm, no murmur,  quiet precordium. Abdomen: Soft abdomen with percutaneous gastrostomy and left upper quadrant. There is intense erythema surrounding the tube which appears to be black distally. There is no drainage from around the tube. Tube is widely patent. Rectal: Not done. Extremities no pedal edema. Deformities Skin no lesions. Neurological alert and oriented x 3. Patient is nonverbal but understands. Psychological normal mood and affect.  Assessment and Plan:  Problem #77 27 year old white male with cerebral palsy who is dependent on tube feedings via gastrostomy. I have discussed changing the gastrostomy by the mother after she is properly trained. We will try to train her during the next change of the gastrostomy which will be due in May 2014. We will plan to place a 22 French replacement gastrostomy. For now, we will  start Prilosec 20 mg twice a day and Diflucan 100 mg daily for 5 days.  12/07/2012 [name]

## 2012-12-08 ENCOUNTER — Other Ambulatory Visit: Payer: Self-pay | Admitting: *Deleted

## 2012-12-08 MED ORDER — OMEPRAZOLE-SODIUM BICARBONATE 20-1100 MG PO CAPS: ORAL_CAPSULE | ORAL | Status: DC

## 2012-12-08 NOTE — Telephone Encounter (Signed)
Patient seen in office yesterday. Per Dr Juanda Chance, patient mom requested omeprazole 20 mg daily since it dissolves better in tube. However, mother is now requesting Zegerid since it dissolves better in the tube. New rx sent to pharmacy.

## 2012-12-14 ENCOUNTER — Telehealth: Payer: Self-pay | Admitting: *Deleted

## 2012-12-14 ENCOUNTER — Other Ambulatory Visit: Payer: Self-pay | Admitting: Family Medicine

## 2012-12-14 MED ORDER — SUCRALFATE 1 GM/10ML PO SUSP: ORAL | Status: DC

## 2012-12-14 NOTE — Telephone Encounter (Signed)
Spoke with patient's mother and gave her Dr. Regino Schultze recommendation.

## 2012-12-14 NOTE — Telephone Encounter (Signed)
[name] states patient has c/o stomach hurting for last 2 days. States he still has a little black coming up in the tube even after completing to 5 days of Diflucan. States when she tried pulling back on the tube for residual, it just collapses the tube. Please, advise.

## 2012-12-14 NOTE — Telephone Encounter (Signed)
Please add Carafate slurry 10cc per PEG qid x 5 days then bid, #16 oz, 1 refill

## 2012-12-17 NOTE — Telephone Encounter (Signed)
Please advise on RE request.  Last OV:06-09-12.  Spoke with the pt's mother and she stated that the pt does have a few pills left, and he will not be running out.//AB/CMA

## 2013-01-03 ENCOUNTER — Encounter: Payer: Self-pay | Admitting: Family Medicine

## 2013-01-03 ENCOUNTER — Ambulatory Visit (INDEPENDENT_AMBULATORY_CARE_PROVIDER_SITE_OTHER): Payer: Medicare Other | Admitting: Family Medicine

## 2013-01-03 VITALS — BP 104/68 | HR 94 | Temp 97.7°F

## 2013-01-03 DIAGNOSIS — B370 Candidal stomatitis: Secondary | ICD-10-CM | POA: Diagnosis not present

## 2013-01-03 DIAGNOSIS — G809 Cerebral palsy, unspecified: Secondary | ICD-10-CM

## 2013-01-03 MED ORDER — FLUCONAZOLE 100 MG PO TABS: ORAL_TABLET | ORAL | Status: DC

## 2013-01-03 NOTE — Progress Notes (Signed)
  Subjective:    Patient ID: [name], male    DOB: 01/17/1986, 27 y.o.   MRN: [id number]  HPI Pt here with mom c/o residual yeast infection in mouth.  He had one previously that was in stomach too and GI rx diflucan.  Mom thinks it is not completely gone and would like a few more days.  No fever, no sore throat. No upper resp symptoms.    Review of Systems As above     Objective:   Physical Exam  BP 104/68  Pulse 94  Temp(Src) 97.7 F (36.5 C) (Tympanic)  SpO2 96% General appearance: alert, cooperative, appears stated age and no distress Throat: abnormal findings: + white coating on tongue,  no errythema      Assessment & Plan:  Oral thrush--- refill diflucan for 5 days                       Call prn

## 2013-01-03 NOTE — Patient Instructions (Signed)
Thrush, Adult   Thrush is a yeast infection that develops in the mouth and throat and on the tongue. The medical term for this is oropharyngeal candidiasis, or OPC. Thrush is most common in older adults, but it can occur at any age. Thrush occurs when a yeast called candida grows out of control. Candida normally is present in small amounts in the mouth and on other mucous membranes. However, under certain circumstances, candida can grow rapidly, causing thrush. Thrush can be a recurring problem for people who have chronic illnesses or who take medications that limit the body's ability to fight infection (weakened immune system). Since these people have difficulty fighting infections, the fungus that causes thrush can spread throughout the body. This can cause life-threatening blood or organ infections.  CAUSES   Candida, the yeast that causes thrush, is normally present in small amounts in the mouth and on other mucous membranes. It usually causes no harm. However, when conditions are present that allow the yeast to grow uncontrolled, it invades surrounding tissues and becomes an infection. Thrush is most commonly caused by the yeast Candida albicans. Less often, other forms of candida can lead to thrush.  There are many types of bacteria in your mouth that normally control the growth of candida. Sometimes a new type of bacteria gets into your mouth and disrupts the balance of the germs already there. This can allow candida to overgrow. Other factors that increase your risk of developing thrush include:   An impaired ability to fight infection (weakened immune system). A normal immune system is usually strong enough to prevent candida from overgrowing.   Older adults are more likely to develop thrush because they may have weaker immune systems.   People with human immunodeficiency virus (HIV) infection have a high likelihood of developing thrush. About 90% of people with HIV develop thrush at some point during  the course of their disease.   People with diabetes are more likely to get thrush because high blood sugar levels promote overgrowth of the candida fungus.   A dry mouth (xerostomia). Dry mouth can result from overuse of mouthwashes or from certain conditions such as Sjgren's syndrome.   Pregnancy. Hormone changes during pregnancy can lead to thrush by altering the balance of bacteria in the mouth.   Poor dental care, especially in people who have false teeth.   The use of antibiotic medications. This may lead to thrush by changing the balance of bacteria in the mouth.  SYMPTOMS   Thrush can be a mild infection that causes no symptoms. If symptoms develop, they may include the following:   A burning feeling in the mouth and throat. This can occur at the start of a thrush infection.   White patches that adhere to the mouth and tongue. The tissue around the patches may be red, raw, and painful. If rubbed (during tooth brushing, for example), the patches and the tissue of the mouth may bleed easily.   A bad taste in the mouth or difficulty tasting foods.   Cottony feeling in the mouth.   Sometimes pain during eating and swallowing.  DIAGNOSIS   Your caregiver can usually diagnose thrush by exam. In addition to looking in your mouth, your caregiver will ask you questions about your health.  TREATMENT   Medications that help prevent the growth of fungi (antifungals) are the standard treatment for thrush. These medications are either applied directly to the affected area (topical) or swallowed (oral).  Mild thrush  In   adults, mild cases of thrush may clear up with simple treatment that can be done at home. This treatment usually involves using an antifungal mouth rinse or lozenges. Treatment usually lasts about 14 days.  Moderate to severe thrush   More severe thrush infections that have spread to the esophagus are treated with an oral antifungal medication. A topical antifungal medication may also be  used.   For some severe infections, a treatment period longer than 14 days may be needed.   Oral antifungal medications are almost never used during pregnancy because the fetus may be harmed. However, if a pregnant woman has a rare, severe thrush infection that has spread to her blood, oral antifungal medications may be used. In this case, the risk of harm to the mother and fetus from the severe thrush infection may be greater than the risk posed by the use of antifungal medications.  Persistent or recurrent thrush  Persistent (does not go away) or recurrent (keeps coming back) cases of thrush may:   Need to be treated twice as long as the symptoms last.   Require treatment with both oral and topical antifungal medications.   People with weakened immune systems can take an antifungal medication on a continuous basis to prevent thrush infections.  It is important to treat conditions that make you more likely to get thrush, such as diabetes, human immunodeficiency virus (HIV), or cancer.   HOME CARE INSTRUCTIONS    If you are breast-feeding, you should clean your nipples with an antifungal medication, such as nystatin (Mycostatin). Dry your nipples after breast-feeding. Applying lanolin-containing body lotion may help relieve nipple soreness.   If you wear dentures and get thrush, remove dentures before going to bed, brush them vigorously, and soak in a solution of chlorhexidine gluconate or a product such as Polident or Efferdent.   Eating plain, unflavored yogurt that contains live cultures (check the label) can also help cure thrush. Yogurt helps healthy bacteria grow in the mouth. These bacteria stop the growth of the yeast that causes thrush.   Adults can treat thrush at home with gentian violet (1%), a dye that kills bacteria and fungi. It is available without a prescription. If there is no known cause for the infection or if gentian violet does not cure the thrush, you need to see your  caregiver.  Comfort measures  Measures can be taken to reduce the discomfort of thrush:   Drink cold liquids such as water or iced tea. Eat flavored ice treats or frozen juices.   Eat foods that are easy to swallow such as gelatin, ice cream, or custard.   If the patches are painful, try drinking from a straw.   Rinse your mouth several times a day with a warm saltwater rinse. You can make the saltwater mixture with 1 tsp (5 g) of salt in 8 fl oz (0.2 L) of warm water.  PROGNOSIS    Most cases of thrush are mild and clear up with the use of an antifungal mouth rinse or lozenges. Very mild cases of thrush may clear up without medical treatment. It usually takes about 14 days of treatment with an oral antifungal medication to cure more severe thrush infections. In some cases, thrush may last several weeks even with treatment.   If thrush goes untreated and does not go away by itself, it can spread to other parts of the body.   Thrush can spread to the throat, the vagina, or the skin. It rarely spreads   to other organs of the body.  Thrush is more likely to recur (come back) in:   People who use inhaled corticosteroids to treat asthma.   People who take antibiotic medications for a long time.   People who have false teeth.   People who have a weakened immune system.  RISKS AND COMPLICATIONS  Complications related to thrush are rare in healthy people.  There are several factors that can increase your risk of developing thrush.  Age  Older adults, especially those who have serious health problems, are more likely to develop thrush because their immune systems are likely to be weaker.  Behavior   The yeast that causes thrush can be spread by oral sex.   Heavy smoking can lower the body's ability to fight off infections. This makes thrush more likely to develop.  Other conditions   False teeth (dentures), braces, or a retainer that irritates the mouth make it hard to keep the mouth clean. An unclean mouth is  more likely to develop thrush than a clean mouth.   People with a weakened immune system, such as those who have diabetes or human immunodeficiency virus (HIV) or who are undergoing chemotherapy, have an increased risk for developing thrush.  Medications  Some medications can allow the fungus that causes thrush to grow uncontrolled. Common ones are:   Antibiotics, especially those that kill a wide range of organisms (broad-spectrum antibiotics), such as tetracycline commonly can cause thrush.   Birth control pills (oral contraceptives).   Medications that weaken the body's immune system, such as corticosteroids.  Environment  Exposure over time to certain environmental chemicals, such as benzene and pesticides, can weaken the body's immune system. This increases your risk for developing infections, including thrush.  SEEK IMMEDIATE MEDICAL CARE IF:   Your symptoms are getting worse or are not improving within 7 days of starting treatment.   You have symptoms of spreading infection, such as white patches on the skin outside of the mouth.   You are nursing and you have redness and pain in the nipples in spite of home treatment or if you have burning pain in the nipple area when you nurse. Your baby's mouth should also be examined to determine whether thrush is causing your symptoms.  Document Released: 07/14/2004 Document Revised: 01/11/2012 Document Reviewed: 10/24/2008  [employer] Patient Information 2013 [employer]

## 2013-01-05 ENCOUNTER — Encounter: Payer: Self-pay | Admitting: Lab

## 2013-01-06 ENCOUNTER — Ambulatory Visit: Payer: Managed Care, Other (non HMO) | Admitting: Family Medicine

## 2013-01-09 ENCOUNTER — Ambulatory Visit: Payer: Medicare Other | Admitting: Internal Medicine

## 2013-01-09 ENCOUNTER — Ambulatory Visit (INDEPENDENT_AMBULATORY_CARE_PROVIDER_SITE_OTHER): Payer: Medicare Other | Admitting: Family Medicine

## 2013-01-09 ENCOUNTER — Encounter: Payer: Self-pay | Admitting: Family Medicine

## 2013-01-09 ENCOUNTER — Ambulatory Visit: Payer: Medicare Other | Admitting: Family Medicine

## 2013-01-09 ENCOUNTER — Ambulatory Visit (HOSPITAL_BASED_OUTPATIENT_CLINIC_OR_DEPARTMENT_OTHER)
Admission: RE | Admit: 2013-01-09 | Discharge: 2013-01-09 | Disposition: A | Discharge status: Home / Self Care | Payer: Medicare Other | Source: Ambulatory Visit | Attending: Family Medicine | Admitting: Family Medicine

## 2013-01-09 VITALS — BP 108/62 | HR 91 | Temp 98.2°F

## 2013-01-09 DIAGNOSIS — M25559 Pain in unspecified hip: Secondary | ICD-10-CM | POA: Diagnosis not present

## 2013-01-09 DIAGNOSIS — M25551 Pain in right hip: Secondary | ICD-10-CM

## 2013-01-09 DIAGNOSIS — M549 Dorsalgia, unspecified: Secondary | ICD-10-CM | POA: Diagnosis not present

## 2013-01-09 DIAGNOSIS — Z981 Arthrodesis status: Secondary | ICD-10-CM | POA: Diagnosis not present

## 2013-01-09 DIAGNOSIS — M545 Low back pain, unspecified: Secondary | ICD-10-CM | POA: Insufficient documentation

## 2013-01-09 MED ORDER — DIAZEPAM 5 MG PO TABS: ORAL_TABLET | ORAL | Status: DC

## 2013-01-09 NOTE — Patient Instructions (Addendum)
Hip Pain  The hips join the upper legs to the lower pelvis. The bones, cartilage, tendons, and muscles of the hip joint perform a lot of work each day holding your body weight and allowing you to move around.  Hip pain is a common symptom. It can range from a minor ache to severe pain on 1 or both hips. Pain may be felt on the inside of the hip joint near the groin, or the outside near the buttocks and upper thigh. There may be swelling or stiffness as well. It occurs more often when a person walks or performs activity. There are many reasons hip pain can develop.  CAUSES   It is important to work with your caregiver to identify the cause since many conditions can impact the bones, cartilage, muscles, and tendons of the hips. Causes for hip pain include:   Broken (fractured) bones.   Separation of the thighbone from the hip socket (dislocation).   Torn cartilage of the hip joint.   Swelling (inflammation) of a tendon (tendonitis), the sac within the hip joint (bursitis), or a joint.   A weakening in the abdominal wall (hernia), affecting the nerves to the hip.   Arthritis in the hip joint or lining of the hip joint.   Pinched nerves in the back, hip, or upper thigh.   A bulging disc in the spine (herniated disc).   Rarely, bone infection or cancer.  DIAGNOSIS   The location of your hip pain will help your caregiver understand what may be causing the pain. A diagnosis is based on your medical history, your symptoms, results from your physical exam, and results from diagnostic tests. Diagnostic tests may include X-ray exams, a computerized magnetic scan (magnetic resonance imaging, MRI), or bone scan.  TREATMENT   Treatment will depend on the cause of your hip pain. Treatment may include:   Limiting activities and resting until symptoms improve.   Crutches or other walking supports (a cane or brace).   Ice, elevation, and compression.   Physical therapy or home exercises.    Shoe inserts or special shoes.   Losing weight.   Medications to reduce pain.   Undergoing surgery.  HOME CARE INSTRUCTIONS    Only take over-the-counter or prescription medicines for pain, discomfort, or fever as directed by your caregiver.   Put ice on the injured area:   Put ice in a plastic bag.   Place a towel between your skin and the bag.   Leave the ice on for 15 to 20 minutes at a time, 3 to 4 times a day.   Keep your leg raised (elevated) when possible to lessen swelling.   Avoid activities that cause pain.   Follow specific exercises as directed by your caregiver.   Sleep with a pillow between your legs on your most comfortable side.   Record how often you have hip pain, the location of the pain, and what it feels like. This information may be helpful to you and your caregiver.   Ask your caregiver about returning to work or sports and whether you should drive.   Follow up with your caregiver for further exams, therapy, or testing as directed.  SEEK MEDICAL CARE IF:    Your pain or swelling continues or worsens after 1 week.   You are feeling unwell or have chills.   You have increasing difficulty with walking.   You have a loss of sensation or other new symptoms.   You have questions   or concerns.  SEEK IMMEDIATE MEDICAL CARE IF:    You cannot put weight on the affected hip.   You have fallen.   You have a sudden increase in pain and swelling in your hip.   You have a fever.  MAKE SURE YOU:    Understand these instructions.   Will watch your condition.   Will get help right away if you are not doing well or get worse.  Document Released: 04/08/2010 Document Revised: 01/11/2012 Document Reviewed: 04/08/2010  Exit[employer] Patient Information 2013 [employer].

## 2013-01-09 NOTE — Progress Notes (Signed)
  Subjective:    [name] is a 27 y.o. male who presents with right hip pain. Onset of the symptoms was several weeks ago. Inciting event: none. The patient reports the hip pain is worse with weight bearing and he is in a wheelchair so it is only when parents and other care givers lift him to his feet.. Aggravating symptoms include: any weight bearing. Patient has had prior hip problems. Previous visits for this problem: none. Evaluation to date: pt has had several tests of back in past secondary to baclofen pump and problems with pain before.  none recent low back , hip.. Treatment to date: none.  The following portions of the patient's history were reviewed and updated as appropriate: allergies, current medications, past family history, past medical history, past social history, past surgical history and problem list.   Review of Systems Pertinent items are noted in HPI.   Objective:    BP 108/62  Pulse 91  Temp(Src) 98.2 F (36.8 C) (Oral)  SpO2 95% Right hip: difficult to asses secondary to pt in wheelchair and very difficult to move  Left hip: same as Right   Imaging: X-ray the right hip: not available    Assessment:    hip pain    Plan:    X-rays per orders. PT referral. check xray hip and low back

## 2013-01-10 ENCOUNTER — Other Ambulatory Visit: Payer: Self-pay | Admitting: Family Medicine

## 2013-01-10 IMAGING — CR DG CHEST 2V
1 series · 1 of 1 positions shown · non-contrast
Comparison: 04/21/2011

CLINICAL DATA: Productive cough

CHEST - 2 VIEW

[w chest lat]
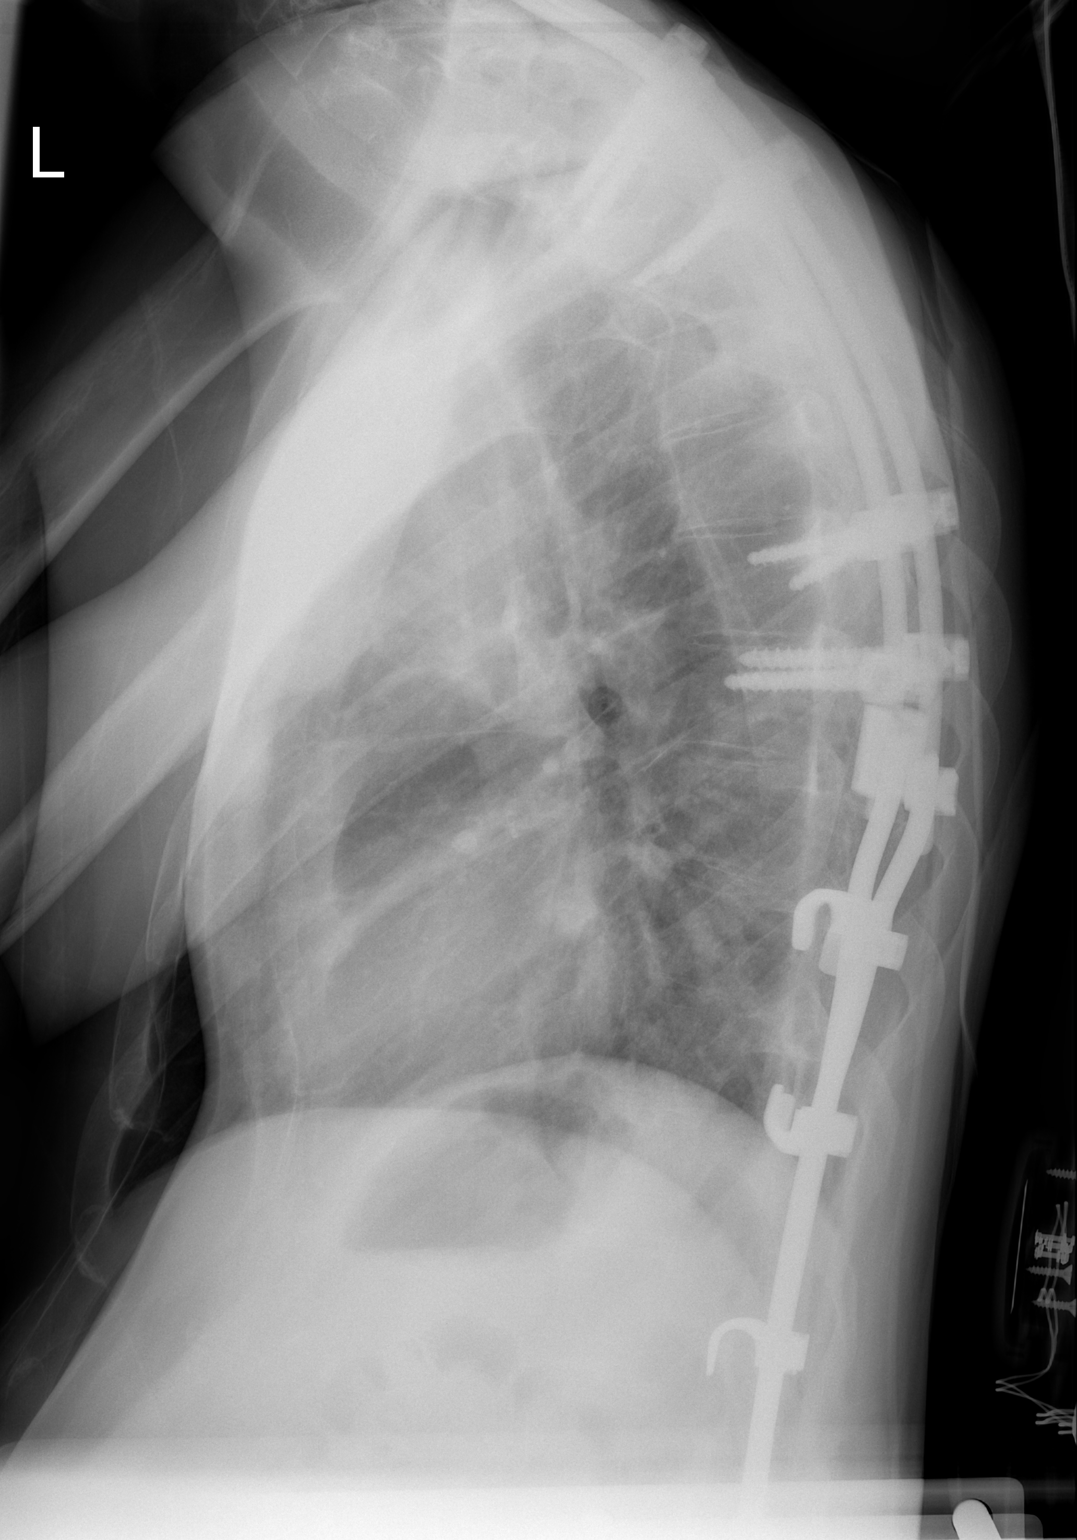

[1 of 1 positions shown; findings below may reference images not displayed]

FINDINGS: Cardiomediastinal silhouette is stable.  Metallic
fixation rods thoracolumbar spine are stable.  Stable catheter in
the right upper hemithorax probable old ventricular atrial shunt.
IMPRESSION: No active disease.  No significant change.

## 2013-01-16 ENCOUNTER — Telehealth: Payer: Self-pay | Admitting: Internal Medicine

## 2013-01-16 NOTE — Telephone Encounter (Signed)
Dr Juanda Chance- [name] requests that we send only #30 omeprazole tablets to the pharmacy for patient due to insurance coverage. I have explained that if [facility] pharmacy will send me a note, I can do a prior authorization to get him approved for omeprazole twice daily as this is how Dr Juanda Chance requested he take the medication. Mom states that she has been given Kwaku only once daily omeprazole for several weeks and he has done fine with the decreased dose. Dr Juanda Chance, are you okay with decreased dose or do you prefer I do prior auth?

## 2013-01-16 NOTE — Telephone Encounter (Signed)
OK to take Prilosec 20 mg once a day if the mother reports no symptoms with a once a day regimen

## 2013-01-17 ENCOUNTER — Other Ambulatory Visit: Payer: Self-pay | Admitting: Family Medicine

## 2013-01-17 DIAGNOSIS — M62838 Other muscle spasm: Secondary | ICD-10-CM

## 2013-01-17 MED ORDER — OMEPRAZOLE-SODIUM BICARBONATE 20-1100 MG PO CAPS: ORAL_CAPSULE | ORAL | Status: DC

## 2013-01-18 ENCOUNTER — Ambulatory Visit: Payer: Medicare Other | Admitting: Physical Therapy

## 2013-01-26 ENCOUNTER — Ambulatory Visit: Payer: Medicare Other | Attending: Family Medicine | Admitting: Physical Therapy

## 2013-01-26 DIAGNOSIS — IMO0001 Reserved for inherently not codable concepts without codable children: Secondary | ICD-10-CM | POA: Diagnosis not present

## 2013-01-26 DIAGNOSIS — M25559 Pain in unspecified hip: Secondary | ICD-10-CM | POA: Insufficient documentation

## 2013-01-26 DIAGNOSIS — M2420 Disorder of ligament, unspecified site: Secondary | ICD-10-CM | POA: Insufficient documentation

## 2013-01-26 DIAGNOSIS — M62838 Other muscle spasm: Secondary | ICD-10-CM | POA: Diagnosis not present

## 2013-01-26 DIAGNOSIS — M629 Disorder of muscle, unspecified: Secondary | ICD-10-CM | POA: Insufficient documentation

## 2013-01-26 DIAGNOSIS — M545 Low back pain, unspecified: Secondary | ICD-10-CM | POA: Insufficient documentation

## 2013-01-27 ENCOUNTER — Telehealth: Payer: Self-pay | Admitting: *Deleted

## 2013-01-27 NOTE — Telephone Encounter (Signed)
I have spoken to [facility]. Patient has been scheduled for PEG change with 22 french [employer] Gastrostomy Tube using no sedation. He is scheduled for 9:00 am 03/06/13 @ [facility]. I have called and spoken to patient's mother (his caregiver) to advise of time, date and location of gastrostomy change. No prep needed since he will not be sedated. He should arrive at 8:30 am for registration.

## 2013-01-27 NOTE — Telephone Encounter (Signed)
Message copied by Richardson Chiquito on Fri Jan 27, 2013  8:25 AM ------      Message from: Hart Carwin      Created: Thu Jan 26, 2013 10:43 PM       I agree. No sedation 53F BS Replacement gastrostomy.      ----- Message -----         From: Richardson Chiquito, CMA         Sent: 01/26/2013   8:55 AM           To: Hart Carwin, MD             Dr Juanda Chance,       Will PEG change be completed without sedation or do I need to arrange for sedation??      ----- Message -----         From: Richardson Chiquito, CMA         Sent: 01/26/2013           To: Richardson Chiquito, CMA                        ----- Message -----         From: Richardson Chiquito, CMA         Sent: 01/24/2013           To: Richardson Chiquito, CMA            Pt needs PEG change in May @ [facility]. He needs 22 french replacement gastrostomy tube. Call and schedule and let mom know time, date etc. See 12/07/12 office note.             ------

## 2013-01-31 ENCOUNTER — Ambulatory Visit: Payer: Managed Care, Other (non HMO) | Attending: Family Medicine | Admitting: Physical Therapy

## 2013-01-31 DIAGNOSIS — M629 Disorder of muscle, unspecified: Secondary | ICD-10-CM | POA: Diagnosis not present

## 2013-01-31 DIAGNOSIS — IMO0001 Reserved for inherently not codable concepts without codable children: Secondary | ICD-10-CM | POA: Insufficient documentation

## 2013-01-31 DIAGNOSIS — M25559 Pain in unspecified hip: Secondary | ICD-10-CM | POA: Diagnosis not present

## 2013-01-31 DIAGNOSIS — M2420 Disorder of ligament, unspecified site: Secondary | ICD-10-CM | POA: Insufficient documentation

## 2013-02-06 ENCOUNTER — Ambulatory Visit: Payer: Managed Care, Other (non HMO) | Admitting: Physical Therapy

## 2013-02-06 DIAGNOSIS — M25559 Pain in unspecified hip: Secondary | ICD-10-CM | POA: Diagnosis not present

## 2013-02-06 DIAGNOSIS — IMO0001 Reserved for inherently not codable concepts without codable children: Secondary | ICD-10-CM | POA: Diagnosis not present

## 2013-02-06 DIAGNOSIS — M629 Disorder of muscle, unspecified: Secondary | ICD-10-CM | POA: Diagnosis not present

## 2013-02-07 ENCOUNTER — Ambulatory Visit: Payer: Managed Care, Other (non HMO) | Admitting: Physical Therapy

## 2013-02-09 ENCOUNTER — Ambulatory Visit: Payer: Managed Care, Other (non HMO) | Admitting: Physical Therapy

## 2013-02-13 ENCOUNTER — Ambulatory Visit: Payer: Managed Care, Other (non HMO) | Admitting: Physical Therapy

## 2013-02-13 DIAGNOSIS — M629 Disorder of muscle, unspecified: Secondary | ICD-10-CM | POA: Diagnosis not present

## 2013-02-13 DIAGNOSIS — IMO0001 Reserved for inherently not codable concepts without codable children: Secondary | ICD-10-CM | POA: Diagnosis not present

## 2013-02-13 DIAGNOSIS — M25559 Pain in unspecified hip: Secondary | ICD-10-CM | POA: Diagnosis not present

## 2013-02-15 ENCOUNTER — Ambulatory Visit: Payer: Managed Care, Other (non HMO) | Admitting: Physical Therapy

## 2013-02-15 ENCOUNTER — Ambulatory Visit: Payer: Managed Care, Other (non HMO) | Admitting: Family Medicine

## 2013-02-15 DIAGNOSIS — M629 Disorder of muscle, unspecified: Secondary | ICD-10-CM | POA: Diagnosis not present

## 2013-02-15 DIAGNOSIS — M25559 Pain in unspecified hip: Secondary | ICD-10-CM | POA: Diagnosis not present

## 2013-02-15 DIAGNOSIS — IMO0001 Reserved for inherently not codable concepts without codable children: Secondary | ICD-10-CM | POA: Diagnosis not present

## 2013-02-20 ENCOUNTER — Telehealth: Payer: Self-pay | Admitting: *Deleted

## 2013-02-20 ENCOUNTER — Encounter (HOSPITAL_COMMUNITY): Payer: Self-pay | Admitting: Pharmacy Technician

## 2013-02-20 ENCOUNTER — Ambulatory Visit: Payer: Managed Care, Other (non HMO) | Admitting: Physical Therapy

## 2013-02-20 ENCOUNTER — Other Ambulatory Visit: Payer: Self-pay | Admitting: Internal Medicine

## 2013-02-20 DIAGNOSIS — B3789 Other sites of candidiasis: Secondary | ICD-10-CM

## 2013-02-20 DIAGNOSIS — M25559 Pain in unspecified hip: Secondary | ICD-10-CM | POA: Diagnosis not present

## 2013-02-20 DIAGNOSIS — IMO0001 Reserved for inherently not codable concepts without codable children: Secondary | ICD-10-CM | POA: Diagnosis not present

## 2013-02-20 DIAGNOSIS — M629 Disorder of muscle, unspecified: Secondary | ICD-10-CM | POA: Diagnosis not present

## 2013-02-20 NOTE — Telephone Encounter (Signed)
Called and spoke to [place] @ [facility] to confirm that they do have a 20 French 2.5 cm gastrostomy tube on hand for patient's PEG change on 03/06/13. Per Noreene Larsson there is a tube labeled for [place]. However, it is for an 9 [place] 2.5 cm. Advised her that this is incorrect. Dr Juanda Chance requested a 20 French 2.5 cm or 22 French 2.5 cm with preference being 20 [place]. Noreene Larsson states she will check into this.

## 2013-02-21 ENCOUNTER — Ambulatory Visit (HOSPITAL_COMMUNITY)
Admission: RE | Admit: 2013-02-21 | Discharge: 2013-02-21 | Disposition: A | Discharge status: Home / Self Care | Payer: Managed Care, Other (non HMO) | Source: Ambulatory Visit | Attending: Internal Medicine | Admitting: Internal Medicine

## 2013-02-21 ENCOUNTER — Telehealth: Payer: Self-pay | Admitting: *Deleted

## 2013-02-21 DIAGNOSIS — Z4682 Encounter for fitting and adjustment of non-vascular catheter: Secondary | ICD-10-CM | POA: Diagnosis not present

## 2013-02-21 DIAGNOSIS — B3789 Other sites of candidiasis: Secondary | ICD-10-CM

## 2013-02-21 DIAGNOSIS — R1310 Dysphagia, unspecified: Secondary | ICD-10-CM | POA: Insufficient documentation

## 2013-02-21 MED ORDER — IOHEXOL 300 MG/ML  SOLN
50.0000 mL | Freq: Once | INTRAMUSCULAR | Status: AC | PRN
  Administered 2013-02-21: 10 mL

## 2013-02-21 NOTE — Telephone Encounter (Signed)
Message copied by Daphine Deutscher on Tue Feb 21, 2013  4:29 PM ------      Message from: Hart Carwin      Created: Tue Feb 21, 2013 12:33 PM       Reviewed and agree. No  Follow up if PEG functioning again. ------

## 2013-02-21 NOTE — Procedures (Signed)
Successful fluoroscopic guided replacement of 20 Fr gastrostomy tube.  No immediate post procedural complications.  The feeding tube is ready for immediate use. 

## 2013-02-21 NOTE — Telephone Encounter (Signed)
Spoke with mother and PEG is functioning fine. Cancelled procedure in May.

## 2013-02-22 ENCOUNTER — Encounter: Payer: Self-pay | Admitting: Lab

## 2013-02-22 ENCOUNTER — Telehealth (HOSPITAL_COMMUNITY): Payer: Self-pay | Admitting: *Deleted

## 2013-02-23 ENCOUNTER — Encounter: Payer: Self-pay | Admitting: Internal Medicine

## 2013-02-23 ENCOUNTER — Emergency Department (HOSPITAL_COMMUNITY): Payer: Managed Care, Other (non HMO)

## 2013-02-23 ENCOUNTER — Other Ambulatory Visit: Payer: Self-pay

## 2013-02-23 ENCOUNTER — Ambulatory Visit: Payer: Managed Care, Other (non HMO) | Admitting: Physical Therapy

## 2013-02-23 ENCOUNTER — Ambulatory Visit: Payer: Managed Care, Other (non HMO) | Admitting: Family Medicine

## 2013-02-23 ENCOUNTER — Ambulatory Visit (INDEPENDENT_AMBULATORY_CARE_PROVIDER_SITE_OTHER): Payer: Managed Care, Other (non HMO) | Admitting: Internal Medicine

## 2013-02-23 ENCOUNTER — Emergency Department (HOSPITAL_COMMUNITY)
Admission: EM | Admit: 2013-02-23 | Discharge: 2013-02-23 | Disposition: A | Discharge status: Home / Self Care | Payer: Managed Care, Other (non HMO) | Attending: Emergency Medicine | Admitting: Emergency Medicine

## 2013-02-23 ENCOUNTER — Encounter (HOSPITAL_COMMUNITY): Payer: Self-pay | Admitting: *Deleted

## 2013-02-23 VITALS — BP 128/80 | HR 90 | Temp 97.8°F

## 2013-02-23 DIAGNOSIS — Z79899 Other long term (current) drug therapy: Secondary | ICD-10-CM | POA: Diagnosis not present

## 2013-02-23 DIAGNOSIS — S20219A Contusion of unspecified front wall of thorax, initial encounter: Secondary | ICD-10-CM | POA: Diagnosis not present

## 2013-02-23 DIAGNOSIS — E0590 Thyrotoxicosis, unspecified without thyrotoxic crisis or storm: Secondary | ICD-10-CM | POA: Diagnosis not present

## 2013-02-23 DIAGNOSIS — Y92009 Unspecified place in unspecified non-institutional (private) residence as the place of occurrence of the external cause: Secondary | ICD-10-CM | POA: Insufficient documentation

## 2013-02-23 DIAGNOSIS — Z8719 Personal history of other diseases of the digestive system: Secondary | ICD-10-CM | POA: Diagnosis not present

## 2013-02-23 DIAGNOSIS — R072 Precordial pain: Secondary | ICD-10-CM

## 2013-02-23 DIAGNOSIS — F329 Major depressive disorder, single episode, unspecified: Secondary | ICD-10-CM | POA: Insufficient documentation

## 2013-02-23 DIAGNOSIS — R0902 Hypoxemia: Secondary | ICD-10-CM | POA: Diagnosis not present

## 2013-02-23 DIAGNOSIS — R071 Chest pain on breathing: Secondary | ICD-10-CM | POA: Diagnosis not present

## 2013-02-23 DIAGNOSIS — F411 Generalized anxiety disorder: Secondary | ICD-10-CM | POA: Insufficient documentation

## 2013-02-23 DIAGNOSIS — Y939 Activity, unspecified: Secondary | ICD-10-CM | POA: Insufficient documentation

## 2013-02-23 DIAGNOSIS — J209 Acute bronchitis, unspecified: Secondary | ICD-10-CM | POA: Diagnosis not present

## 2013-02-23 DIAGNOSIS — K219 Gastro-esophageal reflux disease without esophagitis: Secondary | ICD-10-CM | POA: Insufficient documentation

## 2013-02-23 DIAGNOSIS — G809 Cerebral palsy, unspecified: Secondary | ICD-10-CM | POA: Diagnosis not present

## 2013-02-23 DIAGNOSIS — F3289 Other specified depressive episodes: Secondary | ICD-10-CM | POA: Insufficient documentation

## 2013-02-23 DIAGNOSIS — X58XXXA Exposure to other specified factors, initial encounter: Secondary | ICD-10-CM | POA: Insufficient documentation

## 2013-02-23 MED ORDER — LEVOFLOXACIN 25 MG/ML PO SOLN: 750.0000 mg | Freq: Every day | ORAL | Status: DC

## 2013-02-23 NOTE — Progress Notes (Signed)
  Subjective:    Patient ID: [name], male    DOB: 1986-09-29, 29 y.o.   MRN: [id number]  HPI Symptoms began 02/22/13 as a gurgly cough associated with chest discomfort substernally. He's also had frontal headache and facial pain without nasal purulence.  He describes chills and sweats; his mother has not noted any fever.  He does have albuterol but this was not initiated.  He has a past history of aspiration pneumonia &  Cerebral palsy  His mother states that there is no history of baseline hypoxemia .   Review of Systems  02/18/13 he had severe choking while being fed; 911 was called. Relief required the Heimlich maneuver, apparently 4-6 times.  He has a history of dysphasia and reflux esophagitis.     Objective:   Physical Exam Cerebral palsy musculoskeletal stigmata. In motorized scooter.Aphasia  No conjunctivitis is suggested clinically.  Tympanic membranes are normal with no bulging or erythema.  Nares are patent without exudate  Oropharynx reveals no erythema or exudate  Breath sounds to be decreased without wheezing or rhonchi. The chest is clear  He has a gallop-type rhythm without significant murmur  Neck musculature is tight; there is no apparent cervical lymphadenopathy. No axillary lymphadenopathy is noted either.          Assessment & Plan:  #1 nonproductive cough associated with some oxygen desaturation and substernal chest discomfort  #2 recent significant dysphagia requiring Heimlich maneuver  #3 substernal chest discomfort; this is most likely related to #2 in context of prior history of esophagitis but further evaluation is indicated to R/O acute esophageal injury/leak Plan: ER assessment with imaging is indicated to rule out aspiration pneumonia in view of the hypoxemia.

## 2013-02-23 NOTE — ED Notes (Signed)
Patient's mom is alert and orientedx4.  Patient and mother were explained discharge instructions and they understood them with no questions.  The patient's mom, [name] is taking the patient home.

## 2013-02-23 NOTE — Patient Instructions (Addendum)
Review and correct the record as indicated. Please share record with all medical staff seen.  

## 2013-02-23 NOTE — ED Provider Notes (Signed)
History     CSN: [id number]  Arrival date & time 02/23/13  1851   First MD Initiated Contact with Patient 02/23/13 1902      Chief Complaint  Patient presents with  . Chest Pain    (Consider location/radiation/quality/duration/timing/severity/associated sxs/prior treatment) HPI Comments: Level V caveat applies secondary to patient's cerebral palsy and nonverbal status.  The patient is a 27 year old male who has problems with dysphagia who was choking on food several days ago and required Heimlich maneuver.  He has since complained of bilateral lower chest wall pain and has had a hypoxia to 87% at the doctor's office, Dr. Alwyn Ren, prior to arrival.  He was sent for evaluation of his ribs and to rule out aspiration pneumonia. Family denies fevers.  Patient is a 27 y.o. male presenting with chest pain. The history is provided by a parent.  Chest Pain   Past Medical History  Diagnosis Date  . Cerebral palsy   . GERD (gastroesophageal reflux disease)   . Anxiety   . Depression   . Thyroid disease     hyper  . Incontinence of feces   . Palpitations   . Esophagitis     Past Surgical History  Procedure Laterality Date  . Spine surgery  ,11/20/2010, 2011  . Eye surgery    . Ears tubes    . Hamstring released    . Baclofen trial    . Baslofen pump implant    . Spinal fusion    . G-tube insert  August 2006  . Spinal fusioncorrect 106 degree kyphosis    . Spinal fusion to correct 70 degree kyphosis    . Tonsillectomy    . Peg placement  10/21/2011    Procedure: PERCUTANEOUS ENDOSCOPIC GASTROSTOMY (PEG) REPLACEMENT;  Surgeon: Hart Carwin, MD;  Location: [facility];  Service: Endoscopy;  Laterality: N/A;    Family History  Problem Relation Age of Onset  . Asthma Mother   . Hyperlipidemia Mother   . Cancer Maternal Grandmother     breast  . Cancer Maternal Grandfather     prostate  . Heart disease Paternal Grandfather     History  Substance Use Topics  . Smoking  status: Never Smoker   . Smokeless tobacco: Never Used  . Alcohol Use: No      Review of Systems  Unable to perform ROS: Patient nonverbal  Cardiovascular: Positive for chest pain.    Allergies  Sulfonamide derivatives  Home Medications   Current Outpatient Rx  Name  Route  Sig  Dispense  Refill  . diazepam (VALIUM) 5 MG tablet   Gastric Tube   5 mg by Gastric Tube route every 8 (eight) hours as needed for anxiety. 1 tablet in the evening and every 8 hours prn         . divalproex (DEPAKOTE SPRINKLE) 125 MG capsule   Gastric Tube   250-500 mg by Gastric Tube route 2 (two) times daily. Take 4 capsules in the morning and 2 in the evening         . doxycycline (DORYX) 100 MG EC tablet   Gastric Tube   100 mg by Gastric Tube route 2 (two) times daily.          Marland Kitchen OLANZapine (ZYPREXA) 5 MG tablet   Gastric Tube   5 mg by Gastric Tube route at bedtime.          Maxwell Caul Bicarbonate (ZEGERID) 20-1100 MG CAPS   Gastric Tube  1 capsule by Gastric Tube route daily before breakfast.          . PARoxetine (PAXIL) 30 MG tablet   Gastric Tube   30 mg by Gastric Tube route every morning.          . polyethylene glycol (MIRALAX) packet   Gastric Tube   17 g by Gastric Tube route daily as needed (constipation).          . sucralfate (CARAFATE) 1 GM/10ML suspension   Gastric Tube   1 g by Gastric Tube route 2 (two) times daily as needed (for bloody discharge).          . tretinoin (RETIN-A) 0.1 % cream   Topical   Apply 1 application topically at bedtime.            BP 110/79  Temp(Src) 98.4 F (36.9 C) (Oral)  Resp 24  SpO2 90%  Physical Exam  Nursing note and vitals reviewed. Constitutional: He appears well-nourished. No distress.  HENT:  Head: Normocephalic and atraumatic.  Mouth/Throat: Oropharynx is clear and moist.  Eyes: Conjunctivae are normal. No scleral icterus.  Cardiovascular: Normal rate, regular rhythm and intact distal  pulses.   Pulmonary/Chest: Effort normal and breath sounds normal. No respiratory distress. He has no wheezes. He has no rales. He exhibits tenderness ( Bilateral lower anterior chest wall tenderness, no crepitance or subcutaneous emphysema).  Abdominal: Soft. He exhibits no distension. There is no tenderness.  PEG tube left mid abdomen normal appearing  Musculoskeletal: He exhibits no edema.  Flexion contractures of the bilateral upper extremities and lower extremities  Neurological: He is alert.  Skin: Skin is warm and dry. No rash noted. He is not diaphoretic. No erythema.    ED Course  Procedures (including critical care time)  Labs Reviewed - No data to display No results found.   No diagnosis found.    MDM  The patient has reproducible chest wall tenderness, oxygen saturations are currently at around 90% on room air, he is not appear to be in distress but does have reproducible tenderness at lower choir imaging to evaluate for recurrent fractures, pneumothorax or aspiration pneumonia. EKG performed due 2 triage c/o chest pain shows no signs of cardiac abnormalities.  ED ECG REPORT  I personally interpreted this EKG   Date: 02/23/2013   Rate: 100  Rhythm: normal sinus rhythm  QRS Axis: normal  Intervals: normal  ST/T Wave abnormalities: normal  Conduction Disutrbances:none  Narrative Interpretation:   Old EKG Reviewed: Compared with 05/21/2008, no significant changes  At change of shift, care signed out to physician Asst. Geiple to followup imaging, the patient appears stable for discharge at imaging normal and oxygenation normal.     Vida Roller, MD 02/24/13 [phone]

## 2013-02-23 NOTE — ED Notes (Signed)
The pt has ahd chest pain and sob since Sunday when he choked  And ems arrived and relieved  His choking.  He was sent here from dr hoppers office to r/o.  No temp.  He has difficulty swallowing

## 2013-02-23 NOTE — ED Notes (Signed)
Patient was at his Auntie's this Saturday and she fed him some cinnamon roll.  The patient's mom said he does not eat anything other than mechanical soft foods but his auntie did not know it.  She did the Heimlich Maneuver and possibly injured his ribs and his abdomen.  His G-tube came out and they had to replace it as well.  The patient's mom is worried because he complained of chest pain today and his oxygen levels were low at his PCP's office.  The mother said he has had aspiration pneumonia before and she thinks he does have a "gurgly cough".

## 2013-02-23 NOTE — ED Provider Notes (Signed)
8:47 PM Handoff from Dr. Hyacinth Meeker. Pt pending x-ray to eval rib fx, pneumothorax, PNA. Plan: treat based on results. Mild hypoxia but in no resp distress. Will cover with levaquin.   8:55 PM X-ray reviewed by myself. Normal appearance. Mother and patient informed. O2 sats between 95-97% on room air during exam. Patient appears well, in no respiratory distress. Mother voices no concerns over treatment and followup.  Urged to return with worsening breathing, increased work of breathing, fever or any other concerns. Mother will use the Tylenol at home for pain.  Exam:  Gen NAD; Heart RRR, nml S1,S2, no m/r/g; Lungs CTAB, tenderness to palpation lower anterior ribs bilaterally; Abd soft, NT, no rebound or guarding.    Renne Crigler, PA-C 02/23/13 2057

## 2013-02-24 NOTE — ED Provider Notes (Signed)
  Medical screening examination/treatment/procedure(s) were conducted as a shared visit with non-physician practitioner(s) and myself.  I personally evaluated the patient during the encounter  Please see my separate respective documentation pertaining to this patient encounter   Vida Roller, MD 02/24/13 [phone]

## 2013-02-27 ENCOUNTER — Ambulatory Visit: Payer: Managed Care, Other (non HMO) | Admitting: Physical Therapy

## 2013-03-02 ENCOUNTER — Ambulatory Visit: Payer: Medicare Other | Admitting: Physical Therapy

## 2013-03-06 ENCOUNTER — Encounter (HOSPITAL_COMMUNITY): Admission: RE | Payer: Self-pay | Source: Ambulatory Visit

## 2013-03-06 ENCOUNTER — Other Ambulatory Visit: Payer: Self-pay | Admitting: Family Medicine

## 2013-03-06 ENCOUNTER — Ambulatory Visit (HOSPITAL_COMMUNITY)
Admission: RE | Admit: 2013-03-06 | Payer: Managed Care, Other (non HMO) | Source: Ambulatory Visit | Admitting: Internal Medicine

## 2013-03-06 SURGERY — REPLACEMENT, PEG TUBE, WITHOUT ENDOSCOPY

## 2013-03-06 NOTE — Telephone Encounter (Signed)
Last seen 01/09/13 and filled 12/14/12 #180 with 2 refills. Please advise     KP

## 2013-03-07 ENCOUNTER — Ambulatory Visit (INDEPENDENT_AMBULATORY_CARE_PROVIDER_SITE_OTHER): Payer: Managed Care, Other (non HMO) | Admitting: Family Medicine

## 2013-03-07 ENCOUNTER — Encounter: Payer: Self-pay | Admitting: Family Medicine

## 2013-03-07 VITALS — HR 99 | Temp 98.7°F | Resp 14

## 2013-03-07 DIAGNOSIS — F411 Generalized anxiety disorder: Secondary | ICD-10-CM | POA: Diagnosis not present

## 2013-03-07 DIAGNOSIS — G809 Cerebral palsy, unspecified: Secondary | ICD-10-CM | POA: Diagnosis not present

## 2013-03-07 MED ORDER — DIAZEPAM 10 MG PO TABS: 10.0000 mg | ORAL_TABLET | Freq: Four times a day (QID) | ORAL | Status: DC | PRN

## 2013-03-07 NOTE — Progress Notes (Signed)
  Subjective:    Patient ID: [name], male    DOB: 1986-07-02, 27 y.o.   MRN: [id number]  HPI Pt here with mom to discuss his meds.  The valium and depakote don't seem to be helping as much. They would like referral back to neuropsych at [facility].    No other complaints.   Review of Systems As above    Objective:   Physical Exam  Pulse 99  Temp(Src) 98.7 F (37.1 C) (Tympanic)  Resp 14  SpO2 97% General appearance: alert, cooperative, appears stated age and no distress       Assessment & Plan:

## 2013-03-07 NOTE — Assessment & Plan Note (Signed)
Con't meds-- inc valium 10 mg  Check depakote level Refer back to neuropsych

## 2013-03-07 NOTE — Patient Instructions (Addendum)

## 2013-03-08 NOTE — Addendum Note (Signed)
Addended by: Silvio Pate D on: 03/08/2013 04:48 PM   Modules accepted: Orders

## 2013-03-08 NOTE — Addendum Note (Signed)
Addended by: Silvio Pate D on: 03/08/2013 04:32 PM   Modules accepted: Orders

## 2013-03-08 NOTE — Addendum Note (Signed)
Addended by: Silvio Pate D on: 03/08/2013 04:46 PM   Modules accepted: Orders

## 2013-03-09 ENCOUNTER — Ambulatory Visit: Payer: Managed Care, Other (non HMO) | Admitting: Family Medicine

## 2013-04-03 ENCOUNTER — Encounter: Payer: Self-pay | Admitting: Family Medicine

## 2013-04-07 ENCOUNTER — Other Ambulatory Visit: Payer: Self-pay | Admitting: Family Medicine

## 2013-04-16 ENCOUNTER — Encounter: Payer: Self-pay | Admitting: Family Medicine

## 2013-04-16 DIAGNOSIS — F411 Generalized anxiety disorder: Secondary | ICD-10-CM

## 2013-04-17 MED ORDER — DIAZEPAM 5 MG PO TABS: 7.5000 mg | ORAL_TABLET | Freq: Every evening | ORAL | Status: DC

## 2013-04-19 ENCOUNTER — Telehealth: Payer: Self-pay

## 2013-04-19 NOTE — Telephone Encounter (Signed)
I/m pretty sure I filled it out.

## 2013-04-19 NOTE — Telephone Encounter (Signed)
I will forward to [facility] for review.     KP

## 2013-04-19 NOTE — Telephone Encounter (Signed)
Message left on VM by [facility] from [employer]- paperwork was faxed at Dr.Lowne's attention on 04/12/13 for request : letter of medical necessity, chart notes and physician orders, as of 04/19/13 no response received. Please advise if paperwork needs to be re-faxed or please fax requested items to [phone]. If you have questions or concerns contact Matt @ [phone]

## 2013-04-20 NOTE — Telephone Encounter (Signed)
Recent progress notes with mobility needs printed and faxed back to [employer] along with letter of necessity.

## 2013-05-02 ENCOUNTER — Telehealth: Payer: Self-pay | Admitting: Internal Medicine

## 2013-05-02 ENCOUNTER — Other Ambulatory Visit: Payer: Self-pay | Admitting: Internal Medicine

## 2013-05-02 NOTE — Telephone Encounter (Signed)
Spoke with patient's mother and she is asking to schedule for a PEG tube change. She states the tube is functioning fine but she states in April IR replaced it. She is interested in getting it changed by Dr. Juanda Chance so she can be trained to do the changing. She is aware that the next hospital week is is August. Please, advise.

## 2013-05-02 NOTE — Telephone Encounter (Signed)
Will change in August 2014. Current PEG placed in radiology 01/2013, is 70F. Iwould like to order 70F and 38F replacement gastrostomies,and I will use  One of them.Please, order them at  [facility]

## 2013-05-03 ENCOUNTER — Other Ambulatory Visit: Payer: Self-pay | Admitting: Family Medicine

## 2013-05-03 NOTE — Telephone Encounter (Signed)
Scheduled replacement with [name] on 06/13/13 at 11:00 AM. Booking number [id number]. She will order 33 F and 22 F 2.5 cm replacement gastrostomy for procedure. Spoke with patient's mother and gave her appointment date, time and instructions.

## 2013-05-03 NOTE — Telephone Encounter (Signed)
Last seen 03/07/13. Please advise    KP

## 2013-05-04 MED ORDER — DIAZEPAM 5 MG PO TABS: ORAL_TABLET | ORAL | Status: DC

## 2013-05-04 MED ORDER — OLANZAPINE 5 MG PO TABS: ORAL_TABLET | ORAL | Status: DC

## 2013-05-04 NOTE — Addendum Note (Signed)
Addended by: Arnette Norris on: 05/04/2013 08:15 AM   Modules accepted: Orders

## 2013-05-12 ENCOUNTER — Telehealth: Payer: Self-pay | Admitting: Internal Medicine

## 2013-05-12 NOTE — Telephone Encounter (Signed)
[name] that orders were faxed back on 05/09/13. The information has been sent to scanning so I am unable to refax the orders to her until it has been scanned. I advised that I will be glad to do this though. [name] verbalizes understanding and states that she did not receive the fax.

## 2013-05-12 NOTE — Telephone Encounter (Signed)
Left message on VM for [name] as follow-up that he received requested items. If yes ok to disregard message, if not [name] was instructed to call the office back (Number left) and follow-up with Dr.Lowne/Assistant

## 2013-05-15 ENCOUNTER — Encounter: Payer: Self-pay | Admitting: Family Medicine

## 2013-05-15 DIAGNOSIS — G801 Spastic diplegic cerebral palsy: Secondary | ICD-10-CM

## 2013-06-05 ENCOUNTER — Other Ambulatory Visit: Payer: Self-pay | Admitting: Family Medicine

## 2013-06-05 NOTE — Telephone Encounter (Signed)
Last seen 03/07/13 and filled 03/06/13 #180 with 2 refills. Please advise        KP

## 2013-06-08 ENCOUNTER — Telehealth: Payer: Self-pay | Admitting: *Deleted

## 2013-06-08 NOTE — Telephone Encounter (Signed)
[name] states she has the replacement tubes requested.

## 2013-06-08 NOTE — Telephone Encounter (Signed)
Message copied by Daphine Deutscher on Thu Jun 08, 2013 10:36 AM ------      Message from: Daphine Deutscher      Created: Wed May 03, 2013  8:51 AM       Call and make sure Noreene Larsson has 20 f and 22 f gastrostomy tube for T. Woolf  ------

## 2013-06-09 ENCOUNTER — Other Ambulatory Visit: Payer: Self-pay | Admitting: *Deleted

## 2013-06-09 ENCOUNTER — Ambulatory Visit (INDEPENDENT_AMBULATORY_CARE_PROVIDER_SITE_OTHER): Payer: Managed Care, Other (non HMO) | Admitting: Internal Medicine

## 2013-06-09 ENCOUNTER — Encounter: Payer: Self-pay | Admitting: Internal Medicine

## 2013-06-09 ENCOUNTER — Telehealth: Payer: Self-pay

## 2013-06-09 VITALS — BP 124/76 | HR 94 | Temp 97.4°F

## 2013-06-09 DIAGNOSIS — J209 Acute bronchitis, unspecified: Secondary | ICD-10-CM | POA: Diagnosis not present

## 2013-06-09 DIAGNOSIS — K9423 Gastrostomy malfunction: Secondary | ICD-10-CM

## 2013-06-09 MED ORDER — AMOXICILLIN-POT CLAVULANATE 250-62.5 MG/5ML PO SUSR: ORAL | Status: DC

## 2013-06-09 MED ORDER — PREDNISONE 20 MG PO TABS: ORAL_TABLET | ORAL | Status: DC

## 2013-06-09 MED ORDER — CEFUROXIME AXETIL 250 MG/5ML PO SUSR: 250.0000 mg | Freq: Two times a day (BID) | ORAL | Status: DC

## 2013-06-09 NOTE — Telephone Encounter (Signed)
RX was sent over per MD

## 2013-06-09 NOTE — Addendum Note (Signed)
Addended by: Maurice Small on: 06/09/2013 05:12 PM   Modules accepted: Orders

## 2013-06-09 NOTE — Progress Notes (Signed)
  Subjective:    Patient ID: [name], male    DOB: December 26, 1985, 27 y.o.   MRN: [id number]  HPI    Symptoms began 06/07/13 has some swallowing dysfunction in the context of postnasal drainage. Reflux symptoms were denied.  He continued to have a loose but nonproductive cough with some wheezing. Temperature has been as high as 100.7.  He has had some frontal headaches.  Over-the-counter cough suppressant, nasal decongestant, and guaifenesin have been employed with partial anomaly      Review of Systems  There has been no associated chills or sweats. He also denies sore throat, dental pain, otic pain, or otic discharge. No extrinsic symptoms     Objective:   Physical Exam General appearance:adequately nourished; no acute distress or increased work of breathing is present. CP stigmata. No  lymphadenopathy about the head, neck, or axilla noted.   Eyes: No conjunctival inflammation or lid edema is present.   Ears:  External ear exam shows no significant lesions or deformities.  Otoscopic examination reveals clear canals, tympanic membranes are intact bilaterally without bulging, retraction, inflammation or discharge.  Nose:  External nasal examination shows no deformity or inflammation. Nasal mucosa are erythematous without lesions or exudates. No septal dislocation or deviation.No obstruction to airflow.   Oral exam: Dental hygiene is good; lips and gums are healthy appearing.There is moderate oropharyngeal erythema ; no exudate noted.   Neck:  No deformities, masses, or tenderness noted.  Asymmetric nucchal rigidity with full range of motion without pain.   Heart:  Normal rate and regular rhythm. S1 and S2 normal without gallop,  click, rub or other extra sounds. Flow murmur  Lungs:decreased BS; rattly NP cough.No increased work of breathing.    Extremities:  No cyanosis, edema, or clubbing  noted    Skin: Warm & dry          Assessment & Plan:  #1 acute bronchitis w/o  bronchospasm #2 URI, acute vs rhinitis Plan: See orders and recommendations

## 2013-06-09 NOTE — Patient Instructions (Addendum)
Nasonex 1 spray in each nostril twice a day as needed toward the ear.Start with prednisone 20 mg twice a day with meals. This can be discontinued after being asymptomatic for 72 hours.

## 2013-06-09 NOTE — Telephone Encounter (Signed)
Message sent to voicemail: ABX that was e-scribed is not available in the generic and the brand name is not cover by patient's insurance plan. Please advise on a substitute patient's mother requested a LIQUID FORM Hopp please advise

## 2013-06-12 ENCOUNTER — Ambulatory Visit (INDEPENDENT_AMBULATORY_CARE_PROVIDER_SITE_OTHER): Payer: Managed Care, Other (non HMO) | Admitting: Family Medicine

## 2013-06-12 ENCOUNTER — Telehealth: Payer: Self-pay | Admitting: Family Medicine

## 2013-06-12 ENCOUNTER — Telehealth: Payer: Self-pay | Admitting: Internal Medicine

## 2013-06-12 ENCOUNTER — Encounter: Payer: Self-pay | Admitting: Family Medicine

## 2013-06-12 VITALS — BP 104/62 | HR 84 | Temp 98.3°F | Resp 18

## 2013-06-12 DIAGNOSIS — J209 Acute bronchitis, unspecified: Secondary | ICD-10-CM

## 2013-06-12 MED ORDER — AZITHROMYCIN 200 MG/5ML PO SUSR: ORAL | Status: DC

## 2013-06-12 MED ORDER — CEFTRIAXONE SODIUM 1 G IJ SOLR
1.0000 g | Freq: Once | INTRAMUSCULAR | Status: AC
  Administered 2013-06-12: 1 g via INTRAMUSCULAR

## 2013-06-12 NOTE — Telephone Encounter (Signed)
OK to proceed

## 2013-06-12 NOTE — Telephone Encounter (Signed)
Patient's Mom is calling with questions about whether the patient still needs to be taking the Prednisone rx he was prescribed when he came in Friday, 06/09/13. She says that his bronchitis has not improved. Please advise.

## 2013-06-12 NOTE — Telephone Encounter (Signed)
Patient has an appointment with Dr. Laury Axon at 3:30p today.

## 2013-06-12 NOTE — Patient Instructions (Signed)

## 2013-06-12 NOTE — H&P (Signed)
History of Present Illness:   This is a 27 year old white male with cerebral palsy who is dependent on percutaneous gastrostomy tube feedings since 2006. His gastrostomy has fallen out several times recently and was replaced in December 2013 in radiology. The mother is interested in being able to change the gastrostomy herself. She brought with her [name] bolus gastrostomy feeding instructions which stated that the patient caregiver may change the gastrostomy feeding tube themselves provided they have been properly obtained and had been given permission from the physician to do so. Her mother is interested in changing it at home because of the expense of having to go to the hospital which usually cost several thousand dollars. She has researched the subject and has a very reasonable approach to this problem. Currently, he has a [address] [place]. In the past, he had [address] [place].       Past Medical History   Diagnosis  Date   .  Cerebral palsy     .  GERD (gastroesophageal reflux disease)     .  Anxiety     .  Depression     .  Thyroid disease         hyper   .  Incontinence of feces     .  Palpitations     .  Esophagitis         Past Surgical History   Procedure  Date   .  Spine surgery  ,11/20/2010, 2011   .  Eye surgery     .  Ears tubes     .  Hamstring released     .  Baclofen trial     .  Baslofen pump implant     .  Spinal fusion     .  G-tube insert  August 2006   .  Spinal fusioncorrect 106 degree kyphosis     .  Spinal fusion to correct 70 degree kyphosis     .  Tonsillectomy     .  Peg placement  10/21/2011       Procedure: PERCUTANEOUS ENDOSCOPIC GASTROSTOMY (PEG) REPLACEMENT;  Surgeon: Hart Carwin, MD;  Location: [facility];  Service: Endoscopy;  Laterality: N/A;       reports that he has never smoked. He has never used smokeless tobacco. He reports that he does not drink alcohol or use illicit drugs. family  history includes Asthma in his mother; Cancer in his maternal grandfather and maternal grandmother; Heart disease in his paternal grandfather; and Hyperlipidemia in his mother. Allergies   Allergen  Reactions   .  Sulfamethoxazole W-Trimethoprim     .  Sulfonamide Derivatives  Rash              Review of Systems: No fever chest pain abdominal pain  The remainder of the 10 point ROS is negative except as outlined in H&P     Physical Exam: General appearance  cerebral palsy deformities of upper extremities and neck, in no distress. Eyes- non icteric. HEENT nontraumatic, normocephalic. Mouth no lesions, tongue papillated, no cheilosis. Neck supple without adenopathy, thyroid not enlarged, no carotid bruits, no JVD. Lungs Clear to auscultation bilaterally. Cor normal S1, normal S2, regular rhythm, no murmur,  quiet precordium. Abdomen: Soft abdomen with percutaneous gastrostomy and left upper quadrant. There is intense erythema surrounding the tube which appears to be black distally. There is no drainage from around the tube. Tube is widely patent. Rectal: Not  done. Extremities no pedal edema. Deformities Skin no lesions. Neurological alert and oriented x 3. Patient is nonverbal but understands. Psychological normal mood and affect.   Assessment and Plan:   Problem #75 27 year old white male with cerebral palsy who is dependent on tube feedings via gastrostomy. I have discussed changing the gastrostomy by the mother after she is properly trained. We will try to train her during the next change of the gastrostomy which will be due in May 2014. We will plan to place a 22 French replacement gastrostomy. For now, we will start Prilosec 20 mg twice a day and Diflucan 100 mg daily for 5 days.   12/07/2012 [name]              Revision History

## 2013-06-12 NOTE — Telephone Encounter (Signed)
Spoke with patient's mother and he has acute bronchitis and is on antibiotics. He does not have a fever just a congested cough. She state PCP thought it would be okay to do the PEG tube but wanted to let Dr. Juanda Chance know.

## 2013-06-12 NOTE — Progress Notes (Signed)
  Subjective:     [name] is a 27 y.o. male here for evaluation of a cough. Onset of symptoms was 1 week ago. Symptoms have been gradually worsening since that time. The cough is productive and is aggravated by infection and reclining position. Associated symptoms include: sputum production. Patient does not have a history of asthma. Patient does not have a history of environmental allergens. Patient has not traveled recently. Patient does not have a history of smoking. Patient has not had a previous chest x-ray. Patient has not had a PPD done.  The following portions of the patient's history were reviewed and updated as appropriate: allergies, current medications, past family history, past medical history, past social history, past surgical history and problem list.  Review of Systems Pertinent items are noted in HPI.    Objective:    Oxygen saturation 96% on room air BP 104/62  Pulse 84  Temp(Src) 98.3 F (36.8 C) (Oral)  Resp 18  SpO2 96% General appearance: alert, cooperative, appears stated age and no distress Ears: normal TM's and external ear canals both ears Nose: Nares normal. Septum midline. Mucosa normal. No drainage or sinus tenderness. Throat: lips, mucosa, and tongue normal; teeth and gums normal Neck: no adenopathy and thyroid not enlarged, symmetric, no tenderness/mass/nodules Lungs: rhonchi bilaterally Heart: S1, S2 normal    Assessment:    Acute Bronchitis    Plan:    Antibiotics per medication orders. Call if shortness of breath worsens, blood in sputum, change in character of cough, development of fever or chills, inability to maintain nutrition and hydration. Avoid exposure to tobacco smoke and fumes. Chest x-ray.

## 2013-06-13 ENCOUNTER — Ambulatory Visit (HOSPITAL_COMMUNITY)
Admission: RE | Admit: 2013-06-13 | Discharge: 2013-06-13 | Disposition: A | Discharge status: Home / Self Care | Payer: Managed Care, Other (non HMO) | Source: Ambulatory Visit | Attending: Internal Medicine | Admitting: Internal Medicine

## 2013-06-13 ENCOUNTER — Encounter (HOSPITAL_COMMUNITY): Payer: Self-pay | Admitting: *Deleted

## 2013-06-13 ENCOUNTER — Encounter (HOSPITAL_COMMUNITY)
Admission: RE | Disposition: A | Discharge status: Home / Self Care | Payer: Self-pay | Source: Ambulatory Visit | Attending: Internal Medicine

## 2013-06-13 DIAGNOSIS — K9423 Gastrostomy malfunction: Secondary | ICD-10-CM | POA: Diagnosis not present

## 2013-06-13 DIAGNOSIS — G809 Cerebral palsy, unspecified: Secondary | ICD-10-CM | POA: Insufficient documentation

## 2013-06-13 DIAGNOSIS — Y833 Surgical operation with formation of external stoma as the cause of abnormal reaction of the patient, or of later complication, without mention of misadventure at the time of the procedure: Secondary | ICD-10-CM | POA: Insufficient documentation

## 2013-06-13 DIAGNOSIS — K219 Gastro-esophageal reflux disease without esophagitis: Secondary | ICD-10-CM | POA: Insufficient documentation

## 2013-06-13 HISTORY — PX: PEG PLACEMENT: SHX5437

## 2013-06-13 SURGERY — REPLACEMENT, PEG TUBE, WITHOUT ENDOSCOPY
Anesthesia: Moderate Sedation

## 2013-06-13 NOTE — Interval H&P Note (Signed)
History and Physical Interval Note:  06/13/2013 12:48 PM  [name]  has presented today for surgery, with the diagnosis of PEG tube change  The various methods of treatment have been discussed with the patient and family. After consideration of risks, benefits and other options for treatment, the patient has consented to  Procedure(s): PERCUTANEOUS ENDOSCOPIC GASTROSTOMY (PEG) REPLACEMENT (N/A) as a surgical intervention .  The patient's history has been reviewed, patient examined, no change in status, stable for surgery.  I have reviewed the patient's chart and labs.  Questions were answered to the patient's satisfaction.     [name]

## 2013-06-13 NOTE — Telephone Encounter (Signed)
[name] notified.

## 2013-06-14 ENCOUNTER — Encounter (HOSPITAL_COMMUNITY): Payer: Self-pay | Admitting: Internal Medicine

## 2013-06-14 NOTE — Op Note (Signed)
[facility] [address] [place], [address]   OPERATIVE PROCEDURE REPORT  PATIENT: [name], [name]  MR#: [id number] BIRTHDATE: Dec 27, 1985  GENDER: Male ENDOSCOPIST: Hart Carwin, MD REFERRED ON:GEXBMW Lowne, DO PROCEDURE DATE:  06/13/2013 PROCEDURE: PEG change ASA CLASS:   Class III INDICATIONS:PEG malfunction, current PEG is 23F replacement , placed in radiology 01/2013 MEDICATIONS: None TOPICAL ANESTHETIC:   none  DESCRIPTION OF PROCEDURE:  A history and physical had previously been performed.  The procedure, indications, potential complications , and alternatives were explained to the patient who appeared to understand.  Opportunity for questions was provided and informed consent was obtained.  The existing PEG tube was removed using traction technique.  45F BS replacement gastrostomy     , The G-tube insertion site was then cleansed,  and the external bolster was placed over the tube to secure it to the abdominal wall.  A sterile dressing was then applied, and the procedure terminated.  COMPLICATIONS: There were no complications. ENDOSCOPIC IMPRESSION: PEG replaced with 45F BS replacement RECOMMENDATIONS: begin feeding today antireflux measures  REPEAT EXAM: as needed   ___________________________________ eSignedHart Carwin, MD 06/14/2013 10:41 AM   CC:

## 2013-06-15 ENCOUNTER — Encounter: Payer: Self-pay | Admitting: Family Medicine

## 2013-06-15 ENCOUNTER — Other Ambulatory Visit: Payer: Self-pay | Admitting: Family Medicine

## 2013-06-15 DIAGNOSIS — R05 Cough: Secondary | ICD-10-CM

## 2013-06-15 DIAGNOSIS — R059 Cough, unspecified: Secondary | ICD-10-CM

## 2013-06-16 ENCOUNTER — Encounter: Payer: Self-pay | Admitting: Family Medicine

## 2013-06-16 ENCOUNTER — Ambulatory Visit (HOSPITAL_BASED_OUTPATIENT_CLINIC_OR_DEPARTMENT_OTHER)
Admission: RE | Admit: 2013-06-16 | Discharge: 2013-06-16 | Disposition: A | Discharge status: Home / Self Care | Payer: Managed Care, Other (non HMO) | Source: Ambulatory Visit | Attending: Family Medicine | Admitting: Family Medicine

## 2013-06-16 DIAGNOSIS — R05 Cough: Secondary | ICD-10-CM

## 2013-06-16 DIAGNOSIS — Z982 Presence of cerebrospinal fluid drainage device: Secondary | ICD-10-CM | POA: Insufficient documentation

## 2013-06-16 DIAGNOSIS — R059 Cough, unspecified: Secondary | ICD-10-CM | POA: Insufficient documentation

## 2013-06-21 ENCOUNTER — Encounter: Payer: Self-pay | Admitting: Physical Medicine & Rehabilitation

## 2013-06-23 ENCOUNTER — Telehealth: Payer: Self-pay | Admitting: Internal Medicine

## 2013-06-23 NOTE — Telephone Encounter (Signed)
Pts mother states she is going to mail [place] a note for what she needs. States she needs a script for some Gtube supplies. States we should have it by Monday.

## 2013-06-26 NOTE — Telephone Encounter (Signed)
Spoke with patient's mother and we have not received any request yet. Will call her when we get it.

## 2013-06-28 MED ORDER — AMBULATORY NON FORMULARY MEDICATION: Status: DC

## 2013-06-28 NOTE — Telephone Encounter (Signed)
Spoke with Ms. Morelos. We received her request.  Sent rx to [facility] at [phone].

## 2013-07-04 ENCOUNTER — Other Ambulatory Visit: Payer: Self-pay | Admitting: Family Medicine

## 2013-07-04 ENCOUNTER — Emergency Department (HOSPITAL_COMMUNITY): Payer: Managed Care, Other (non HMO)

## 2013-07-04 ENCOUNTER — Ambulatory Visit: Payer: Managed Care, Other (non HMO) | Admitting: Family Medicine

## 2013-07-04 ENCOUNTER — Encounter (HOSPITAL_COMMUNITY): Payer: Self-pay | Admitting: Emergency Medicine

## 2013-07-04 ENCOUNTER — Emergency Department (HOSPITAL_COMMUNITY)
Admission: EM | Admit: 2013-07-04 | Discharge: 2013-07-04 | Disposition: A | Discharge status: Home / Self Care | Payer: Managed Care, Other (non HMO) | Attending: Emergency Medicine | Admitting: Emergency Medicine

## 2013-07-04 DIAGNOSIS — F3289 Other specified depressive episodes: Secondary | ICD-10-CM | POA: Insufficient documentation

## 2013-07-04 DIAGNOSIS — K219 Gastro-esophageal reflux disease without esophagitis: Secondary | ICD-10-CM | POA: Diagnosis not present

## 2013-07-04 DIAGNOSIS — R300 Dysuria: Secondary | ICD-10-CM | POA: Insufficient documentation

## 2013-07-04 DIAGNOSIS — M545 Low back pain, unspecified: Secondary | ICD-10-CM | POA: Diagnosis not present

## 2013-07-04 DIAGNOSIS — F329 Major depressive disorder, single episode, unspecified: Secondary | ICD-10-CM | POA: Insufficient documentation

## 2013-07-04 DIAGNOSIS — R05 Cough: Secondary | ICD-10-CM | POA: Diagnosis not present

## 2013-07-04 DIAGNOSIS — Z79899 Other long term (current) drug therapy: Secondary | ICD-10-CM | POA: Insufficient documentation

## 2013-07-04 DIAGNOSIS — R110 Nausea: Secondary | ICD-10-CM | POA: Diagnosis not present

## 2013-07-04 DIAGNOSIS — M549 Dorsalgia, unspecified: Secondary | ICD-10-CM | POA: Insufficient documentation

## 2013-07-04 DIAGNOSIS — F411 Generalized anxiety disorder: Secondary | ICD-10-CM | POA: Insufficient documentation

## 2013-07-04 DIAGNOSIS — R059 Cough, unspecified: Secondary | ICD-10-CM | POA: Diagnosis not present

## 2013-07-04 DIAGNOSIS — D3190 Benign neoplasm of unspecified part of unspecified eye: Secondary | ICD-10-CM | POA: Insufficient documentation

## 2013-07-04 LAB — CBC WITH DIFFERENTIAL/PLATELET
Basophils Absolute: 0 10*3/uL (ref 0.0–0.1)
Basophils Relative: 0 % (ref 0–1)
Eosinophils Absolute: 0 10*3/uL (ref 0.0–0.7)
Eosinophils Relative: 1 % (ref 0–5)
HCT: 42.1 % (ref 39.0–52.0)
Hemoglobin: 14.5 g/dL (ref 13.0–17.0)
Lymphocytes Relative: 40 % (ref 12–46)
Lymphs Abs: 2.5 10*3/uL (ref 0.7–4.0)
MCH: 29.2 pg (ref 26.0–34.0)
MCHC: 34.4 g/dL (ref 30.0–36.0)
MCV: 84.9 fL (ref 78.0–100.0)
Monocytes Absolute: 0.5 10*3/uL (ref 0.1–1.0)
Monocytes Relative: 8 % (ref 3–12)
Neutro Abs: 3.2 10*3/uL (ref 1.7–7.7)
Neutrophils Relative %: 51 % (ref 43–77)
Platelets: 197 10*3/uL (ref 150–400)
RBC: 4.96 MIL/uL (ref 4.22–5.81)
RDW: 13.3 % (ref 11.5–15.5)
WBC: 6.3 10*3/uL (ref 4.0–10.5)

## 2013-07-04 LAB — COMPREHENSIVE METABOLIC PANEL
ALT: 8 U/L (ref 0–53)
AST: 13 U/L (ref 0–37)
Albumin: 3.9 g/dL (ref 3.5–5.2)
Alkaline Phosphatase: 70 U/L (ref 39–117)
BUN: 7 mg/dL (ref 6–23)
CO2: 27 mEq/L (ref 19–32)
Calcium: 9.7 mg/dL (ref 8.4–10.5)
Chloride: 101 mEq/L (ref 96–112)
Creatinine, Ser: 0.54 mg/dL (ref 0.50–1.35)
GFR calc Af Amer: >90 mL/min (ref >90)
GFR calc non Af Amer: >90 mL/min (ref >90)
Glucose, Bld: 87 mg/dL (ref 70–99)
Potassium: 3.7 mEq/L (ref 3.5–5.1)
Sodium: 139 mEq/L (ref 135–145)
Total Bilirubin: 0.3 mg/dL (ref 0.3–1.2)
Total Protein: 7.5 g/dL (ref 6.0–8.3)

## 2013-07-04 LAB — URINALYSIS, ROUTINE W REFLEX MICROSCOPIC
Bilirubin Urine: NEGATIVE
Glucose, UA: NEGATIVE mg/dL
Hgb urine dipstick: NEGATIVE
Ketones, ur: NEGATIVE mg/dL
Leukocytes, UA: NEGATIVE
Nitrite: NEGATIVE
Protein, ur: NEGATIVE mg/dL
Specific Gravity, Urine: 1.009 (ref 1.005–1.030)
Urobilinogen, UA: 0.2 mg/dL (ref 0.0–1.0)
pH: 8 (ref 5.0–8.0)

## 2013-07-04 LAB — LIPASE, BLOOD: Lipase: 27 U/L (ref 11–59)

## 2013-07-04 MED ORDER — HYDROCODONE-ACETAMINOPHEN 7.5-325 MG/15ML PO SOLN: 7.5000 mL | ORAL | Status: DC | PRN

## 2013-07-04 MED ORDER — SODIUM CHLORIDE 0.9 % IV BOLUS (SEPSIS)
1000.0000 mL | Freq: Once | INTRAVENOUS | Status: AC
  Administered 2013-07-04: 1000 mL via INTRAVENOUS

## 2013-07-04 NOTE — Discharge Instructions (Signed)
Back Pain, Adult  Low back pain is very common. About 1 in 5 people have back pain. The cause of low back pain is rarely dangerous. The pain often gets better over time. About half of people with a sudden onset of back pain feel better in just 2 weeks. About 8 in 10 people feel better by 6 weeks.   CAUSES  Some common causes of back pain include:  · Strain of the muscles or ligaments supporting the spine.  · Wear and tear (degeneration) of the spinal discs.  · Arthritis.  · Direct injury to the back.  DIAGNOSIS  Most of the time, the direct cause of low back pain is not known. However, back pain can be treated effectively even when the exact cause of the pain is unknown. Answering your caregiver's questions about your overall health and symptoms is one of the most accurate ways to make sure the cause of your pain is not dangerous. If your caregiver needs more information, he or she may order lab work or imaging tests (X-rays or MRIs). However, even if imaging tests show changes in your back, this usually does not require surgery.  HOME CARE INSTRUCTIONS  For many people, back pain returns. Since low back pain is rarely dangerous, it is often a condition that people can learn to manage on their own.   · Remain active. It is stressful on the back to sit or stand in one place. Do not sit, drive, or stand in one place for more than 30 minutes at a time. Take short walks on level surfaces as soon as pain allows. Try to increase the length of time you walk each day.  · Do not stay in bed. Resting more than 1 or 2 days can delay your recovery.  · Do not avoid exercise or work. Your body is made to move. It is not dangerous to be active, even though your back may hurt. Your back will likely heal faster if you return to being active before your pain is gone.  · Pay attention to your body when you  bend and lift. Many people have less discomfort when lifting if they bend their knees, keep the load close to their bodies, and  avoid twisting. Often, the most comfortable positions are those that put less stress on your recovering back.  · Find a comfortable position to sleep. Use a firm mattress and lie on your side with your knees slightly bent. If you lie on your back, put a pillow under your knees.  · Only take over-the-counter or prescription medicines as directed by your caregiver. Over-the-counter medicines to reduce pain and inflammation are often the most helpful. Your caregiver may prescribe muscle relaxant drugs. These medicines help dull your pain so you can more quickly return to your normal activities and healthy exercise.  · Put ice on the injured area.  · Put ice in a plastic bag.  · Place a towel between your skin and the bag.  · Leave the ice on for 15-20 minutes, 3-4 times a day for the first 2 to 3 days. After that, ice and heat may be alternated to reduce pain and spasms.  · Ask your caregiver about trying back exercises and gentle massage. This may be of some benefit.  · Avoid feeling anxious or stressed. Stress increases muscle tension and can worsen back pain. It is important to recognize when you are anxious or stressed and learn ways to manage it. Exercise is a great option.  SEEK MEDICAL CARE IF:  · You have pain that is not relieved with rest or   medicine.  · You have pain that does not improve in 1 week.  · You have new symptoms.  · You are generally not feeling well.  SEEK IMMEDIATE MEDICAL CARE IF:   · You have pain that radiates from your back into your legs.  · You develop new bowel or bladder control problems.  · You have unusual weakness or numbness in your arms or legs.  · You develop nausea or vomiting.  · You develop abdominal pain.  · You feel faint.  Document Released: 10/19/2005 Document Revised: 04/19/2012 Document Reviewed: 03/09/2011  Exit[employer]® Patient Information ©2014 [employer].

## 2013-07-04 NOTE — ED Notes (Signed)
phlebotomy at bedside.  

## 2013-07-04 NOTE — ED Provider Notes (Signed)
CSN: [id number]     Arrival date & time 07/04/13  1129 History   First MD Initiated Contact with Patient 07/04/13 1333     Chief Complaint  Patient presents with  . Dysuria  . Back Pain   (Consider location/radiation/quality/duration/timing/severity/associated sxs/prior Treatment) HPI Comments: Brought to the ER by mother as primary caregiver. Patient has a history of cerebral palsy, is wheelchair bound. Patient has been complaining of increased back pain for the last 5 days. He has not been sleeping well. He has had several episodes of emesis, nonbilious nonbloody. Patient has not been eating or drinking as much as he normally does. He has not had a fever. He has had a cough, but that has been on for several weeks and he was treated with an antibiotic for that. Mother also concerned about the G-tube site. He has had increased drainage from the area and it appears to be irritated. Was seen by Doctor Juanda Chance last week, mother was told that the site might need to be changed.   Past Medical History  Diagnosis Date  . Cerebral palsy   . GERD (gastroesophageal reflux disease)   . Anxiety   . Depression   . Thyroid disease     hyper  . Incontinence of feces   . Palpitations   . Esophagitis    Past Surgical History  Procedure Laterality Date  . Spine surgery  ,11/20/2010, 2011  . Eye surgery    . Ears tubes    . Hamstring released    . Baclofen trial    . Baslofen pump implant    . Spinal fusion    . G-tube insert  August 2006  . Spinal fusioncorrect 106 degree kyphosis    . Spinal fusion to correct 70 degree kyphosis    . Tonsillectomy    . Peg placement  10/21/2011    Procedure: PERCUTANEOUS ENDOSCOPIC GASTROSTOMY (PEG) REPLACEMENT;  Surgeon: Hart Carwin, MD;  Location: [facility];  Service: Endoscopy;  Laterality: N/A;  . Peg placement N/A 06/13/2013    Procedure: PERCUTANEOUS ENDOSCOPIC GASTROSTOMY (PEG) REPLACEMENT;  Surgeon: Hart Carwin, MD;  Location: [facility];  Service:  Endoscopy;  Laterality: N/A;   Family History  Problem Relation Age of Onset  . Asthma Mother   . Hyperlipidemia Mother   . Cancer Maternal Grandmother     breast  . Cancer Maternal Grandfather     prostate  . Heart disease Paternal Grandfather    History  Substance Use Topics  . Smoking status: Never Smoker   . Smokeless tobacco: Never Used  . Alcohol Use: No    Review of Systems  Constitutional: Negative for fever.  Respiratory: Positive for cough.   Gastrointestinal: Positive for vomiting.  Musculoskeletal: Positive for back pain.  All other systems reviewed and are negative.    Allergies  Sulfonamide derivatives  Home Medications   Current Outpatient Rx  Name  Route  Sig  Dispense  Refill  . Acetylcysteine (N-ACETYL-L-CYSTEINE) 600 MG CAPS   Oral   Take by mouth 2 (two) times daily.         . AMBULATORY NON FORMULARY MEDICATION      Medication Name: MIC gastrostomy/bolus feeding tube 24 French Part number 0110-24. #2 and 10 cc lurer lock syringe #2 Dx:   2 Device   1     Dx: 783.3 abd 343.9   . azithromycin (ZITHROMAX) 200 MG/5ML suspension      2 tsp via J tube day 1  then 1/2 tsp po qd for 4 more days   22.5 mL   0   . DEPAKOTE SPRINKLES 125 MG capsule      TAKE 2 CAPSULES IN THE MORNING AND 4 CAPSULES EVERY EVENING VIA G TUBE.   180 capsule   2     Dispense as written.   . diazepam (VALIUM) 5 MG tablet      TAKE 1 TABLET BY MOUTH IN THE EVENING AND EVERY 8 HOURS AS NEEDED.   60 tablet   3   . doxycycline (DORYX) 100 MG EC tablet   Gastric Tube   100 mg by Gastric Tube route 2 (two) times daily.          Marland Kitchen OLANZapine (ZYPREXA) 5 MG tablet      TAKE 1 TABLET BY MOUTH AT BEDTIME   30 tablet   2   . Omeprazole-Sodium Bicarbonate (ZEGERID) 20-1100 MG CAPS      EMPTY CONTENTS OF 1 CAPSULE AND GIVE VIA PEG ONCE DAILY.   30 capsule   2   . PARoxetine (PAXIL) 30 MG tablet      TAKE 1 TABLET BY MOUTH DAILY.   30 tablet   2   .  polyethylene glycol (MIRALAX) packet   Gastric Tube   17 g by Gastric Tube route daily as needed (constipation).          . predniSONE (DELTASONE) 20 MG tablet      1 bid   14 tablet   0   . sucralfate (CARAFATE) 1 GM/10ML suspension   Gastric Tube   1 g by Gastric Tube route 2 (two) times daily as needed (for bloody discharge).          . tretinoin (RETIN-A) 0.1 % cream   Topical   Apply 1 application topically at bedtime.           BP 118/93  Pulse 73  Temp(Src) 98.6 F (37 C) (Oral)  Resp 16  Wt 115 lb (52.164 kg)  BMI 21.03 kg/m2  SpO2 98% Physical Exam  Constitutional: He is oriented to person, place, and time. He appears well-developed and well-nourished. No distress.  HENT:  Head: Normocephalic and atraumatic.  Right Ear: Hearing normal.  Left Ear: Hearing normal.  Nose: Nose normal.  Mouth/Throat: Oropharynx is clear and moist and mucous membranes are normal.  Eyes: Conjunctivae and EOM are normal. Pupils are equal, round, and reactive to light.  Neck: Normal range of motion. Neck supple.  Cardiovascular: Regular rhythm, S1 normal and S2 normal.  Exam reveals no gallop and no friction rub.   No murmur heard. Pulmonary/Chest: Effort normal and breath sounds normal. No respiratory distress. He exhibits no tenderness.  Abdominal: Soft. Normal appearance and bowel sounds are normal. There is no hepatosplenomegaly. There is no tenderness. There is no rebound, no guarding, no tenderness at McBurney's point and negative Murphy's sign. No hernia.  Musculoskeletal:  Upper extremity spastic contractions  Neurological: He is alert and oriented to person, place, and time. No cranial nerve deficit or sensory deficit. He exhibits abnormal muscle tone. Coordination normal. GCS eye subscore is 4. GCS verbal subscore is 5. GCS motor subscore is 6.  Skin: Skin is warm, dry and intact. No rash noted. No cyanosis.  Psychiatric: He has a normal mood and affect. His speech is  normal and behavior is normal. Thought content normal.    ED Course  Procedures (including critical care time) Labs Review Labs Reviewed  CBC  WITH DIFFERENTIAL  COMPREHENSIVE METABOLIC PANEL  LIPASE, BLOOD  URINALYSIS, ROUTINE W REFLEX MICROSCOPIC   Imaging Review Dg Chest 2 View  07/04/2013   *RADIOLOGY REPORT*  Clinical Data: Cough  CHEST - 2 VIEW  Comparison: 06/16/2013  Findings: Cardiac shadow is stable.  Harrington rods are again identified and stable.  A catheter is noted over the midline similar to that seen on the prior exam.  No focal infiltrate is seen.  IMPRESSION: There are no changes from prior exam.   Original Report Authenticated By: Alcide Clever, M.D.    MDM  Diagnosis: Back pain  Patient brought to the evaluation of back pain. Patient has been complaining of pain in his low back for the last 5 days. He has had decreased sleep at night because of this. He does have a history of cerebral palsy with significant contractions. Mother is concerned, however, because he has complained of some burning with urination and he has not been eating much. Patient was given IV fluids. Blood work is entirely normal. Urinalysis does not show any infection. Patient has had a cough, was treated with antibiotics for this 2 weeks ago. Chest x-ray was clear. At this time I do not have any cause for back pain, but do not suspect anything infectious. Patient has followup with physiatrist in one week. Will provide additional analgesia, followup as needed.    Gilda Crease, MD 07/04/13 [phone]

## 2013-07-04 NOTE — ED Notes (Addendum)
Pt brought in by mother - she reports about 5 days ago pt started to c/o lower back pain that has woken him up the past 5 nights and early in the morning requesting more pain medicine, mother has given extra valium. sts he has had decreased sleep and decreased PO appetite. sts normally he will eat soft foods by mouth but having a lot of gagging with that and vomited a few times recently and requesting ensure through his G-tube, which isn't normal for him. g-tube had some redness around site a few days ago along with increased yellow discharge.G-tube last changed on aug 14. Mother sts she gave him Carafate. Pt started to c/o pain with urination last night. Denies fevers at home. Recent d/x of bronchitis early august - was put on antibiotics and prednisone, finished about a week ago, still having some coughing but not much, reports last week his respiratory symptoms improved a lot. Pt in wheelchair, no acute distress noted at this time, resp e/u, skin warm and dry.

## 2013-07-04 NOTE — ED Notes (Signed)
Pt wheel chair bound with upper extremity contractions c/o lower back pain, dysuria, poor PO intake x 5 days; pt cared for at home by family

## 2013-07-11 ENCOUNTER — Encounter
Payer: Managed Care, Other (non HMO) | Attending: Physical Medicine & Rehabilitation | Admitting: Physical Medicine & Rehabilitation

## 2013-07-11 ENCOUNTER — Encounter: Payer: Self-pay | Admitting: Physical Medicine & Rehabilitation

## 2013-07-11 VITALS — BP 129/95 | HR 79 | Resp 14 | Ht 62.0 in | Wt 115.0 lb

## 2013-07-11 DIAGNOSIS — M40299 Other kyphosis, site unspecified: Secondary | ICD-10-CM | POA: Diagnosis not present

## 2013-07-11 DIAGNOSIS — R21 Rash and other nonspecific skin eruption: Secondary | ICD-10-CM | POA: Diagnosis not present

## 2013-07-11 DIAGNOSIS — R1312 Dysphagia, oropharyngeal phase: Secondary | ICD-10-CM | POA: Insufficient documentation

## 2013-07-11 DIAGNOSIS — M545 Low back pain, unspecified: Secondary | ICD-10-CM

## 2013-07-11 DIAGNOSIS — Z981 Arthrodesis status: Secondary | ICD-10-CM | POA: Insufficient documentation

## 2013-07-11 DIAGNOSIS — G808 Other cerebral palsy: Secondary | ICD-10-CM | POA: Diagnosis not present

## 2013-07-11 DIAGNOSIS — M4000 Postural kyphosis, site unspecified: Secondary | ICD-10-CM

## 2013-07-11 DIAGNOSIS — M549 Dorsalgia, unspecified: Secondary | ICD-10-CM | POA: Diagnosis not present

## 2013-07-11 DIAGNOSIS — Z931 Gastrostomy status: Secondary | ICD-10-CM | POA: Diagnosis not present

## 2013-07-11 MED ORDER — FLUCONAZOLE 100 MG PO TABS: 100.0000 mg | ORAL_TABLET | Freq: Every day | ORAL | Status: DC

## 2013-07-11 MED ORDER — LIDOCAINE 5 % EX PTCH: 2.0000 | MEDICATED_PATCH | CUTANEOUS | Status: DC

## 2013-07-11 NOTE — Patient Instructions (Signed)
TRY TYLENOL BEFORE BED, 500MG 

## 2013-07-11 NOTE — Progress Notes (Signed)
Subjective:    Patient ID: [name], male    DOB: November 13, 1985, 27 y.o.   MRN: [id number]  HPI  This is an initial visit for Mr. 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   Over the last few years he's had increased pain in his low back and hips and associated weakness. He saw his PCP and he was rx'ed therapy last year and again this year.   Nichollas has a long history of spasticity for which he had a baclofen pump placed(removed 2011), spinal fusion surgeries the last of which was in January of 2012.---This fusion extended from the mid thoracic spine to C2 levels approximately. He had lumbar spinal fusion back around 2000.   Over time he's developed worsening spasticity in his neck/trunk, limbs. Family, assistant continue to work on ROM at home. He's had therapy this spring.  His low back pain has been increasing more recently, as note above, often waking him up at night. Recent XRAYS of his backs and pelvis were unremarkable except for chronic, dengerative, findings. His sleep has been poor. The pain seems to have been worse over the last couple weeks. His mother and care givers have freqently spent time with him at night to help with his symptoms.   For pain relief, his family is using massage, topical meds, re-positioning. He has not been using any pain medication as he doesn't prefer to use pain medication. Occasionally he has used tylenol through his G-tube.      Pain Inventory Average Pain 10 Pain Right Now 10 My pain is intermittent, stabbing and tingling  In the last 24 hours, has pain interfered with the following? General activity 0 Relation with others 0 Enjoyment of life 0 What TIME of day is your pain at its worst? evening, night Sleep (in general) Poor  Pain is worse with: walking, bending and some activites Pain improves with: heat/ice, therapy/exercise and pacing activities Relief from Meds: 2  Mobility ability  to climb steps?  no do you drive?  no use a wheelchair needs help with transfers  Function disabled: date disabled na I need assistance with the following:  feeding, dressing, bathing, toileting, meal prep, household duties and shopping  Neuro/Psych bladder control problems numbness tremor tingling spasms depression anxiety  Prior Studies Any changes since last visit?  no  Physicians involved in your care Any changes since last visit?  no   Family History  Problem Relation Age of Onset  . Asthma Mother   . Hyperlipidemia Mother   . Cancer Maternal Grandmother     breast  . Cancer Maternal Grandfather     prostate  . Heart disease Paternal Grandfather    History   Social History  . Marital Status: Single    Spouse Name: N/A    Number of Children: 0  . Years of Education: N/A   Occupational History  . disbaled    Social History Main Topics  . Smoking status: Never Smoker   . Smokeless tobacco: Never Used  . Alcohol Use: No  . Drug Use: No  . Sexual Activity: None   Other Topics Concern  . None   Social History Narrative  . None   Past Surgical History  Procedure Laterality Date  . Spine surgery  ,11/20/2010, 2011  . Eye surgery    . Ears tubes    . Hamstring released    . Baclofen trial    . Baslofen pump  implant    . Spinal fusion    . G-tube insert  August 2006  . Spinal fusioncorrect 106 degree kyphosis    . Spinal fusion to correct 70 degree kyphosis    . Tonsillectomy    . Peg placement  10/21/2011    Procedure: PERCUTANEOUS ENDOSCOPIC GASTROSTOMY (PEG) REPLACEMENT;  Surgeon: Hart Carwin, MD;  Location: [facility];  Service: Endoscopy;  Laterality: N/A;  . Peg placement N/A 06/13/2013    Procedure: PERCUTANEOUS ENDOSCOPIC GASTROSTOMY (PEG) REPLACEMENT;  Surgeon: Hart Carwin, MD;  Location: [facility];  Service: Endoscopy;  Laterality: N/A;   Past Medical History  Diagnosis Date  . Cerebral palsy   . GERD (gastroesophageal reflux  disease)   . Anxiety   . Depression   . Thyroid disease     hyper  . Incontinence of feces   . Palpitations   . Esophagitis    BP 129/95  Pulse 79  Resp 14  Ht 5\' 2"  (1.575 m)  Wt 115 lb (52.164 kg)  BMI 21.03 kg/m2  SpO2 100%     Review of Systems  Constitutional: Positive for diaphoresis.  HENT:       Respiratory infection  Gastrointestinal: Positive for constipation.  Genitourinary: Positive for decreased urine volume.  Skin: Positive for rash.  Neurological: Positive for tremors and numbness.       Spasms, tingling   Psychiatric/Behavioral: Positive for confusion and dysphoric mood. The patient is nervous/anxious.   All other systems reviewed and are negative.       Objective:   Physical Exam  General: Alert and oriented x 3, No apparent distress. He is sitting in a motorized chair with head rest and trunk supports. He makes good eye contact HEENT: Head is normocephalic, atraumatic, PERRLA, EOMI, sclera anicteric, oral mucosa pink and moist, dentition intact, ext ear canals clear,  Neck: Supple without JVD or lymphadenopathy Heart: Reg rate and rhythm. No murmurs rubs or gallops Chest: CTA bilaterally without wheezes, rales, or rhonchi; no distress Abdomen: Soft, non-tender, non-distended, bowel sounds positive. Extremities: No clubbing, cyanosis, or edema. Pulses are 2+ Skin: Clean and intact without signs of breakdown although there is an area of red/demarcated macular rash around the tube site.  Neuro: pt able to respond to simple questions and answers, but speech is very dysarthric. He has decreased oralmotor control. He has some movement of his cervical musculature but is limited to his fusion and pain as well. Tone in the UE is 4/4 pec major/minor, 3/4 bicep and wrist/HI--perhaps even a little more than that. He had fluctuating tone in his hamstrings, quads, HAD's at 2-3/4. When supine he was able to lay surprisingly flat. Heel cords were tight as well at 3/4.  Pt with minimal volitional movement on exam. He can sense pain in the trunk and extremities Musculoskeletal: low back, trunk and cervical ROM is limited. He has some kyphosis. His neck tends to devitate to the right and rotates left. Both traps and SCM's are tender to touch and are tight, particularly the traps. He has tenderness with palpation of the lower lumbar spine---this re-produced his pain which he complains of at night. He had some tenderness in the hips with rotation and FABER's testing, but this was not the main pain he was here for.  Psych: Pt's affect is appropriate. Pt is cooperative given his cognitive linguistic issues.          Assessment & Plan:  1. CP with spastic tetraplegia 2. 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 Severe oro-pharyngeal dysphagia with G-tube, fungal rash at site.    Plan: 1. Given the prominence of his spasticity, it's not realistic to think that we can treat all of his tone with botox. There is also the contracture issue and spinal fusions. While spasticity in his hips and hamstrings may contribute to his low back pain, I don't believe they are main factor. I think that his hardware, spondylosis,  and atrophy of his trunk musculature is playing the biggest role.  He is having spasticity in his shoulder girdle and cervical musculature which is more directly related to his tone. Therefore, we will set him up for bilateral trap and SCM botox, perhaps a few other paraspinal muscles also, 200units. 2. Recommended the use of tylenol before bed, 500mg  3. Begin a trial of lidoderm patches, 5mg , 2 over the lower lumbar spine, nightly before bed. 4. Follow up with me in a few weeks for injections. May have to consider other analgesics to help with his pain control. 5. We discussed formal therapy, which we could consider after LE botox, if this is eventually done.

## 2013-07-12 ENCOUNTER — Encounter: Payer: Self-pay | Admitting: Physical Medicine & Rehabilitation

## 2013-07-14 ENCOUNTER — Telehealth: Payer: Self-pay

## 2013-07-14 ENCOUNTER — Ambulatory Visit (INDEPENDENT_AMBULATORY_CARE_PROVIDER_SITE_OTHER): Payer: Managed Care, Other (non HMO) | Admitting: General Surgery

## 2013-07-14 MED ORDER — LIDOCAINE HCL 2 % EX GEL: Freq: Two times a day (BID) | CUTANEOUS | Status: DC

## 2013-07-14 NOTE — Telephone Encounter (Signed)
We could try flector, but those are unlikely to be covered either.  How about lidocaine gel 5%, apply bid to low back

## 2013-07-14 NOTE — Telephone Encounter (Signed)
Lidocaine patches were denied by insurance.  Is there something else you would like the patient to try.  I am unsure what is covered at this time.

## 2013-07-14 NOTE — Telephone Encounter (Signed)
Lidocaine gel sent to pharmacy.

## 2013-07-27 ENCOUNTER — Ambulatory Visit (HOSPITAL_COMMUNITY)
Admission: RE | Admit: 2013-07-27 | Discharge: 2013-07-27 | Disposition: A | Discharge status: Home / Self Care | Payer: Managed Care, Other (non HMO) | Source: Ambulatory Visit | Attending: Interventional Radiology | Admitting: Interventional Radiology

## 2013-07-27 ENCOUNTER — Other Ambulatory Visit (HOSPITAL_COMMUNITY): Payer: Self-pay | Admitting: Interventional Radiology

## 2013-07-27 DIAGNOSIS — Z431 Encounter for attention to gastrostomy: Secondary | ICD-10-CM | POA: Diagnosis not present

## 2013-07-27 DIAGNOSIS — R1310 Dysphagia, unspecified: Secondary | ICD-10-CM

## 2013-07-27 DIAGNOSIS — K9423 Gastrostomy malfunction: Secondary | ICD-10-CM | POA: Diagnosis not present

## 2013-07-27 MED ORDER — IOHEXOL 300 MG/ML  SOLN
50.0000 mL | Freq: Once | INTRAMUSCULAR | Status: AC | PRN
  Administered 2013-07-27: 10 mL via INTRAVENOUS

## 2013-07-27 NOTE — Procedures (Signed)
Successful fluoroscopic guided replacement of 24 Fr gastrostomy tube.  No immediate post procedural complications.  The feeding tube is ready for immediate use. 

## 2013-08-01 ENCOUNTER — Ambulatory Visit: Payer: Managed Care, Other (non HMO) | Admitting: Internal Medicine

## 2013-08-04 ENCOUNTER — Emergency Department (HOSPITAL_COMMUNITY): Payer: Managed Care, Other (non HMO)

## 2013-08-04 ENCOUNTER — Emergency Department (HOSPITAL_COMMUNITY)
Admission: EM | Admit: 2013-08-04 | Discharge: 2013-08-05 | Disposition: A | Discharge status: Home / Self Care | Payer: Managed Care, Other (non HMO) | Attending: Emergency Medicine | Admitting: Emergency Medicine

## 2013-08-04 ENCOUNTER — Encounter (HOSPITAL_COMMUNITY): Payer: Self-pay

## 2013-08-04 DIAGNOSIS — Z79899 Other long term (current) drug therapy: Secondary | ICD-10-CM | POA: Insufficient documentation

## 2013-08-04 DIAGNOSIS — R6889 Other general symptoms and signs: Secondary | ICD-10-CM | POA: Diagnosis not present

## 2013-08-04 DIAGNOSIS — T17308A Unspecified foreign body in larynx causing other injury, initial encounter: Secondary | ICD-10-CM | POA: Diagnosis not present

## 2013-08-04 DIAGNOSIS — R0600 Dyspnea, unspecified: Secondary | ICD-10-CM

## 2013-08-04 DIAGNOSIS — R0609 Other forms of dyspnea: Secondary | ICD-10-CM | POA: Insufficient documentation

## 2013-08-04 DIAGNOSIS — K219 Gastro-esophageal reflux disease without esophagitis: Secondary | ICD-10-CM | POA: Diagnosis not present

## 2013-08-04 DIAGNOSIS — IMO0002 Reserved for concepts with insufficient information to code with codable children: Secondary | ICD-10-CM | POA: Insufficient documentation

## 2013-08-04 DIAGNOSIS — Z862 Personal history of diseases of the blood and blood-forming organs and certain disorders involving the immune mechanism: Secondary | ICD-10-CM | POA: Diagnosis not present

## 2013-08-04 DIAGNOSIS — F3289 Other specified depressive episodes: Secondary | ICD-10-CM | POA: Insufficient documentation

## 2013-08-04 DIAGNOSIS — R0602 Shortness of breath: Secondary | ICD-10-CM | POA: Diagnosis not present

## 2013-08-04 DIAGNOSIS — F329 Major depressive disorder, single episode, unspecified: Secondary | ICD-10-CM | POA: Insufficient documentation

## 2013-08-04 DIAGNOSIS — Z8639 Personal history of other endocrine, nutritional and metabolic disease: Secondary | ICD-10-CM | POA: Insufficient documentation

## 2013-08-04 DIAGNOSIS — Z8669 Personal history of other diseases of the nervous system and sense organs: Secondary | ICD-10-CM | POA: Insufficient documentation

## 2013-08-04 DIAGNOSIS — Y939 Activity, unspecified: Secondary | ICD-10-CM | POA: Insufficient documentation

## 2013-08-04 DIAGNOSIS — Y92009 Unspecified place in unspecified non-institutional (private) residence as the place of occurrence of the external cause: Secondary | ICD-10-CM | POA: Insufficient documentation

## 2013-08-04 DIAGNOSIS — F411 Generalized anxiety disorder: Secondary | ICD-10-CM | POA: Diagnosis not present

## 2013-08-04 DIAGNOSIS — R0989 Other specified symptoms and signs involving the circulatory and respiratory systems: Secondary | ICD-10-CM | POA: Insufficient documentation

## 2013-08-04 DIAGNOSIS — T17320A Food in larynx causing asphyxiation, initial encounter: Secondary | ICD-10-CM

## 2013-08-04 NOTE — ED Provider Notes (Signed)
CSN: [id number]     Arrival date & time 08/04/13  2039 History   First MD Initiated Contact with Patient 08/04/13 2141     Chief Complaint  Patient presents with  . Choking   (Consider location/radiation/quality/duration/timing/severity/associated sxs/prior Treatment) HPI Comments: Pt is a 27 year old, CP pt who had a choking episode prior to arrival tonight. He was with his aunt who is a certified care taker and he was eating some mashed up yams and and black bean burger and felt the sensation that he had something hung in his throat. He had difficulty breathing for a short period of time. He has had choking episodes before and he doesn't present in any distress at this time. Denies recent illness, fever, sick contacts or difficulty breathing before tonights episode.  The history is provided by the patient. No language interpreter was used.    Past Medical History  Diagnosis Date  . Cerebral palsy   . GERD (gastroesophageal reflux disease)   . Anxiety   . Depression   . Thyroid disease     hyper  . Incontinence of feces   . Palpitations   . Esophagitis    Past Surgical History  Procedure Laterality Date  . Spine surgery  ,11/20/2010, 2011  . Eye surgery    . Ears tubes    . Hamstring released    . Baclofen trial    . Baslofen pump implant    . Spinal fusion    . G-tube insert  August 2006  . Spinal fusioncorrect 106 degree kyphosis    . Spinal fusion to correct 70 degree kyphosis    . Tonsillectomy    . Peg placement  10/21/2011    Procedure: PERCUTANEOUS ENDOSCOPIC GASTROSTOMY (PEG) REPLACEMENT;  Surgeon: Hart Carwin, MD;  Location: [facility];  Service: Endoscopy;  Laterality: N/A;  . Peg placement N/A 06/13/2013    Procedure: PERCUTANEOUS ENDOSCOPIC GASTROSTOMY (PEG) REPLACEMENT;  Surgeon: Hart Carwin, MD;  Location: [facility];  Service: Endoscopy;  Laterality: N/A;   Family History  Problem Relation Age of Onset  . Asthma Mother   . Hyperlipidemia Mother   .  Cancer Maternal Grandmother     breast  . Cancer Maternal Grandfather     prostate  . Heart disease Paternal Grandfather    History  Substance Use Topics  . Smoking status: Never Smoker   . Smokeless tobacco: Never Used  . Alcohol Use: No    Review of Systems  Constitutional: Negative for fever.  Respiratory: Negative for cough, choking, chest tightness, shortness of breath, wheezing and stridor.   Gastrointestinal: Negative for vomiting.  All other systems reviewed and are negative.    Allergies  Sulfonamide derivatives  Home Medications   Current Outpatient Rx  Name  Route  Sig  Dispense  Refill  . Acetylcysteine (N-ACETYL-L-CYSTEINE) 600 MG CAPS   PEG Tube   1 capsule by PEG Tube route 2 (two) times daily.          . AMBULATORY NON FORMULARY MEDICATION      Medication Name: MIC gastrostomy/bolus feeding tube 24 French Part number 0110-24. #2 and 10 cc lurer lock syringe #2 Dx:   2 Device   1     Dx: 783.3 abd 343.9   . DEPAKOTE SPRINKLES 125 MG capsule      TAKE 2 CAPSULES IN THE MORNING AND 4 CAPSULES EVERY EVENING VIA G TUBE.   180 capsule   2  Dispense as written.   . diazepam (VALIUM) 5 MG tablet   Oral   Take 5-7.5 mg by mouth See admin instructions. Pt takes 7.5 every evening to help sleep, takes 5 mg q6h prn for agitation         . doxycycline (DORYX) 100 MG EC tablet   Gastric Tube   100 mg by Gastric Tube route 2 (two) times daily as needed. acne         . fluconazole (DIFLUCAN) 100 MG tablet   Oral   Take 1 tablet (100 mg total) by mouth daily.   5 tablet   0   . lidocaine (LIDODERM) 5 %   Transdermal   Place 2 patches onto the skin daily. Apply to the low back. Remove & Discard patch within 12 hours or as directed by MD   60 patch   0   . lidocaine (XYLOCAINE JELLY) 2 % jelly   Topical   Apply topically 2 (two) times daily.   30 mL   0   . Nutritional Supplements (ENSURE PO)   Oral   Take 8 oz by mouth daily.          Marland Kitchen OLANZapine (ZYPREXA) 5 MG tablet      TAKE 1 TABLET BY MOUTH AT BEDTIME   30 tablet   2   . Omeprazole-Sodium Bicarbonate (ZEGERID) 20-1100 MG CAPS      EMPTY CONTENTS OF 1 CAPSULE AND GIVE VIA PEG ONCE DAILY.   30 capsule   2   . PARoxetine (PAXIL) 30 MG tablet      TAKE 1 TABLET BY MOUTH DAILY.   30 tablet   2   . polyethylene glycol (MIRALAX) packet   Gastric Tube   17 g by Gastric Tube route daily as needed (constipation).          . sucralfate (CARAFATE) 1 G tablet   PEG Tube   1 g by PEG Tube route every morning.         . tretinoin (RETIN-A) 0.1 % cream   Topical   Apply 1 application topically daily as needed (acne).           BP 154/94  Pulse 123  Temp(Src) 98.8 F (37.1 C) (Oral)  Resp 18  SpO2 98% Physical Exam  Vitals reviewed. Constitutional: He appears well-developed and well-nourished. No distress.  HENT:  Head: Normocephalic and atraumatic.  Mouth/Throat: Oropharynx is clear and moist.  Eyes: Pupils are equal, round, and reactive to light.  Neck: Normal range of motion. Neck supple. No JVD present. No tracheal deviation present.  Cardiovascular: Normal rate, regular rhythm, normal heart sounds and intact distal pulses.   Pulmonary/Chest: Effort normal and breath sounds normal. No stridor. No respiratory distress. He has no wheezes. He has no rales.  Abdominal: Soft. Bowel sounds are normal.  Lymphadenopathy:    He has no cervical adenopathy.  Neurological: He is alert.  Skin: Skin is warm and dry.  Psychiatric: He has a normal mood and affect. His behavior is normal. Judgment and thought content normal.    ED Course  Procedures (including critical care time) Labs Review Labs Reviewed - No data to display Imaging Review No results found.  MDM   1. Choking due to food in larynx, initial encounter    Patent airway on arrival to ER, no difficulty breathing, wheezing or shortness of breath. He has had similar episodes before.  Vital signs stable while in ER. Instructed caregiver on  when to return to ER and signs of aspiration pneumonia. No acute process at this time, chest x-ray clear.    Irish Elders, NP 08/04/13 2328  Irish Elders, NP 08/04/13 2329  Medical screening examination/treatment/procedure(s) were conducted as a shared visit with non-physician practitioner(s) or resident and myself. I personally evaluated the patient during the encounter and agree with the findings and plan unless otherwise indicated.  Mild brief choking episode. Hx of aspiration pneumonia. Family member/ caregiver in the room, mild compared to previous. Pt has no sxs in ED. HR in the room 90s, increased when pt excited. Lungs clear, mild tachy at times, abd soft/ ND. Discussed possible delayed aspiration sxs and strict reasons to return. Pt smiling, no coughing. FM okay with plan and will stay with him next 24 hrs. DC  Choking event, CP hx   Enid Skeens, MD 08/05/13 [phone]

## 2013-08-04 NOTE — ED Notes (Signed)
Per [facility] pt live at home with a hx of CP, aunt is certified care taker, family out of town, pt receives most intake through G-tube, pt is allowed to have some PO foods, aunt gave pt a black bean burger, pt has a hx of choking, pt felt something hung in his and began choking, aunt reports hx of aspiration associated with his symptoms tonight, lungs clear bilateral, [place] 2 L O2 98-99%, unable to obtain BP to pt's arm constricted, HR 140 originally, HR 115 on arrival to ED. Pt alert and oriented x4.

## 2013-08-05 ENCOUNTER — Observation Stay (HOSPITAL_COMMUNITY): Payer: Managed Care, Other (non HMO)

## 2013-08-05 ENCOUNTER — Observation Stay (HOSPITAL_COMMUNITY)
Admission: EM | Admit: 2013-08-05 | Discharge: 2013-08-06 | DRG: 391 | Disposition: A | Discharge status: Home Health | Payer: Managed Care, Other (non HMO) | Attending: Internal Medicine | Admitting: Internal Medicine

## 2013-08-05 ENCOUNTER — Encounter (HOSPITAL_COMMUNITY): Payer: Self-pay

## 2013-08-05 DIAGNOSIS — Z79899 Other long term (current) drug therapy: Secondary | ICD-10-CM

## 2013-08-05 DIAGNOSIS — F329 Major depressive disorder, single episode, unspecified: Secondary | ICD-10-CM | POA: Diagnosis present

## 2013-08-05 DIAGNOSIS — F3289 Other specified depressive episodes: Secondary | ICD-10-CM | POA: Diagnosis present

## 2013-08-05 DIAGNOSIS — R633 Feeding difficulties, unspecified: Secondary | ICD-10-CM

## 2013-08-05 DIAGNOSIS — D649 Anemia, unspecified: Secondary | ICD-10-CM

## 2013-08-05 DIAGNOSIS — I81 Portal vein thrombosis: Secondary | ICD-10-CM

## 2013-08-05 DIAGNOSIS — M545 Low back pain, unspecified: Secondary | ICD-10-CM

## 2013-08-05 DIAGNOSIS — K9420 Gastrostomy complication, unspecified: Secondary | ICD-10-CM

## 2013-08-05 DIAGNOSIS — R0600 Dyspnea, unspecified: Secondary | ICD-10-CM

## 2013-08-05 DIAGNOSIS — D239 Other benign neoplasm of skin, unspecified: Secondary | ICD-10-CM

## 2013-08-05 DIAGNOSIS — E0590 Thyrotoxicosis, unspecified without thyrotoxic crisis or storm: Secondary | ICD-10-CM

## 2013-08-05 DIAGNOSIS — R6889 Other general symptoms and signs: Secondary | ICD-10-CM | POA: Diagnosis not present

## 2013-08-05 DIAGNOSIS — M4000 Postural kyphosis, site unspecified: Secondary | ICD-10-CM

## 2013-08-05 DIAGNOSIS — R21 Rash and other nonspecific skin eruption: Secondary | ICD-10-CM

## 2013-08-05 DIAGNOSIS — Z981 Arthrodesis status: Secondary | ICD-10-CM | POA: Diagnosis present

## 2013-08-05 DIAGNOSIS — K219 Gastro-esophageal reflux disease without esophagitis: Secondary | ICD-10-CM | POA: Diagnosis present

## 2013-08-05 DIAGNOSIS — T17908A Unspecified foreign body in respiratory tract, part unspecified causing other injury, initial encounter: Secondary | ICD-10-CM

## 2013-08-05 DIAGNOSIS — R0602 Shortness of breath: Secondary | ICD-10-CM | POA: Diagnosis not present

## 2013-08-05 DIAGNOSIS — F411 Generalized anxiety disorder: Secondary | ICD-10-CM | POA: Diagnosis present

## 2013-08-05 DIAGNOSIS — G809 Cerebral palsy, unspecified: Secondary | ICD-10-CM | POA: Diagnosis present

## 2013-08-05 DIAGNOSIS — R1310 Dysphagia, unspecified: Secondary | ICD-10-CM

## 2013-08-05 DIAGNOSIS — R1312 Dysphagia, oropharyngeal phase: Principal | ICD-10-CM | POA: Diagnosis present

## 2013-08-05 DIAGNOSIS — L708 Other acne: Secondary | ICD-10-CM

## 2013-08-05 DIAGNOSIS — R002 Palpitations: Secondary | ICD-10-CM

## 2013-08-05 DIAGNOSIS — R32 Unspecified urinary incontinence: Secondary | ICD-10-CM | POA: Diagnosis present

## 2013-08-05 DIAGNOSIS — G808 Other cerebral palsy: Secondary | ICD-10-CM | POA: Diagnosis present

## 2013-08-05 DIAGNOSIS — Z8719 Personal history of other diseases of the digestive system: Secondary | ICD-10-CM

## 2013-08-05 DIAGNOSIS — J690 Pneumonitis due to inhalation of food and vomit: Secondary | ICD-10-CM

## 2013-08-05 DIAGNOSIS — Z931 Gastrostomy status: Secondary | ICD-10-CM

## 2013-08-05 DIAGNOSIS — M40299 Other kyphosis, site unspecified: Secondary | ICD-10-CM

## 2013-08-05 LAB — CBC
HCT: 39.7 % (ref 39.0–52.0)
Hemoglobin: 13.7 g/dL (ref 13.0–17.0)
MCH: 29.5 pg (ref 26.0–34.0)
MCHC: 34.5 g/dL (ref 30.0–36.0)
MCV: 85.6 fL (ref 78.0–100.0)
Platelets: 212 10*3/uL (ref 150–400)
RBC: 4.64 MIL/uL (ref 4.22–5.81)
RDW: 13.5 % (ref 11.5–15.5)
WBC: 7.8 10*3/uL (ref 4.0–10.5)

## 2013-08-05 LAB — BASIC METABOLIC PANEL
BUN: 11 mg/dL (ref 6–23)
CO2: 25 mEq/L (ref 19–32)
Calcium: 9.1 mg/dL (ref 8.4–10.5)
Chloride: 104 mEq/L (ref 96–112)
Creatinine, Ser: 0.52 mg/dL (ref 0.50–1.35)
GFR calc Af Amer: >90 mL/min (ref >90)
GFR calc non Af Amer: >90 mL/min (ref >90)
Glucose, Bld: 102 mg/dL — ABNORMAL HIGH (ref 70–99)
Potassium: 3.7 mEq/L (ref 3.5–5.1)
Sodium: 143 mEq/L (ref 135–145)

## 2013-08-05 LAB — CREATININE, SERUM
Creatinine, Ser: 0.53 mg/dL (ref 0.50–1.35)
GFR calc Af Amer: >90 mL/min (ref >90)
GFR calc non Af Amer: >90 mL/min (ref >90)

## 2013-08-05 LAB — GLUCOSE, CAPILLARY
Glucose-Capillary: 101 mg/dL — ABNORMAL HIGH (ref 70–99)
Glucose-Capillary: 113 mg/dL — ABNORMAL HIGH (ref 70–99)
Glucose-Capillary: 127 mg/dL — ABNORMAL HIGH (ref 70–99)
Glucose-Capillary: 84 mg/dL (ref 70–99)

## 2013-08-05 LAB — CBC WITH DIFFERENTIAL/PLATELET
Basophils Absolute: 0 10*3/uL (ref 0.0–0.1)
Basophils Relative: 0 % (ref 0–1)
Eosinophils Absolute: 0.1 10*3/uL (ref 0.0–0.7)
Eosinophils Relative: 1 % (ref 0–5)
HCT: 41.2 % (ref 39.0–52.0)
Hemoglobin: 14.1 g/dL (ref 13.0–17.0)
Lymphocytes Relative: 41 % (ref 12–46)
Lymphs Abs: 3 10*3/uL (ref 0.7–4.0)
MCH: 29.4 pg (ref 26.0–34.0)
MCHC: 34.2 g/dL (ref 30.0–36.0)
MCV: 86 fL (ref 78.0–100.0)
Monocytes Absolute: 0.6 10*3/uL (ref 0.1–1.0)
Monocytes Relative: 8 % (ref 3–12)
Neutro Abs: 3.7 10*3/uL (ref 1.7–7.7)
Neutrophils Relative %: 50 % (ref 43–77)
Platelets: 218 10*3/uL (ref 150–400)
RBC: 4.79 MIL/uL (ref 4.22–5.81)
RDW: 13.5 % (ref 11.5–15.5)
WBC: 7.4 10*3/uL (ref 4.0–10.5)

## 2013-08-05 MED ORDER — OLANZAPINE 5 MG PO TABS
5.0000 mg | ORAL_TABLET | Freq: Every day | ORAL | Status: DC
  Administered 2013-08-05: 5 mg via ORAL
  Filled 2013-08-05 (×2): qty 1

## 2013-08-05 MED ORDER — PANTOPRAZOLE SODIUM 40 MG PO PACK
40.0000 mg | PACK | Freq: Every day | ORAL | Status: DC
  Administered 2013-08-05 – 2013-08-06 (×2): 40 mg
  Filled 2013-08-05 (×2): qty 20

## 2013-08-05 MED ORDER — CLINDAMYCIN PHOSPHATE 600 MG/50ML IV SOLN
600.0000 mg | Freq: Three times a day (TID) | INTRAVENOUS | Status: DC
  Administered 2013-08-05 – 2013-08-06 (×4): 600 mg via INTRAVENOUS
  Filled 2013-08-05 (×6): qty 50

## 2013-08-05 MED ORDER — ADVANCED PROBIOTIC 10 PO CAPS: 1.0000 | ORAL_CAPSULE | Freq: Every day | ORAL | Status: DC

## 2013-08-05 MED ORDER — SODIUM CHLORIDE 0.9 % IV SOLN: INTRAVENOUS | Status: DC

## 2013-08-05 MED ORDER — OSMOLITE 1.5 CAL PO LIQD
1000.0000 mL | ORAL | Status: DC
  Administered 2013-08-05: 1000 mL
  Filled 2013-08-05 (×4): qty 1000

## 2013-08-05 MED ORDER — FLUCONAZOLE 100 MG PO TABS
100.0000 mg | ORAL_TABLET | Freq: Every day | ORAL | Status: DC
  Administered 2013-08-05 – 2013-08-06 (×2): 100 mg via ORAL
  Filled 2013-08-05 (×2): qty 1

## 2013-08-05 MED ORDER — SODIUM CHLORIDE 0.9 % IV BOLUS (SEPSIS)
500.0000 mL | Freq: Once | INTRAVENOUS | Status: AC
  Administered 2013-08-05: 500 mL via INTRAVENOUS

## 2013-08-05 MED ORDER — HEPARIN SODIUM (PORCINE) 5000 UNIT/ML IJ SOLN
5000.0000 [IU] | Freq: Three times a day (TID) | INTRAMUSCULAR | Status: DC
  Administered 2013-08-05 – 2013-08-06 (×4): 5000 [IU] via SUBCUTANEOUS
  Filled 2013-08-05 (×7): qty 1

## 2013-08-05 MED ORDER — LEVOFLOXACIN IN D5W 750 MG/150ML IV SOLN
750.0000 mg | Freq: Once | INTRAVENOUS | Status: DC
  Administered 2013-08-05: 750 mg via INTRAVENOUS
  Filled 2013-08-05: qty 150

## 2013-08-05 MED ORDER — SACCHAROMYCES BOULARDII 250 MG PO CAPS
250.0000 mg | ORAL_CAPSULE | Freq: Two times a day (BID) | ORAL | Status: DC
  Administered 2013-08-05 – 2013-08-06 (×3): 250 mg via ORAL
  Filled 2013-08-05 (×4): qty 1

## 2013-08-05 MED ORDER — ACETYLCYSTEINE 20 % IN SOLN
600.0000 mg | Freq: Two times a day (BID) | RESPIRATORY_TRACT | Status: DC | PRN
  Filled 2013-08-05: qty 4

## 2013-08-05 MED ORDER — DIVALPROEX SODIUM 125 MG PO CPSP
250.0000 mg | ORAL_CAPSULE | Freq: Every day | ORAL | Status: DC
  Administered 2013-08-05 – 2013-08-06 (×2): 250 mg via ORAL
  Filled 2013-08-05 (×2): qty 2

## 2013-08-05 MED ORDER — PANTOPRAZOLE SODIUM 40 MG PO TBEC
40.0000 mg | DELAYED_RELEASE_TABLET | Freq: Every day | ORAL | Status: DC
  Filled 2013-08-05: qty 1

## 2013-08-05 MED ORDER — DEXTROSE-NACL 5-0.9 % IV SOLN
INTRAVENOUS | Status: DC
  Administered 2013-08-05: 1000 mL via INTRAVENOUS
  Administered 2013-08-05: 800 mL via INTRAVENOUS
  Administered 2013-08-05: 06:00:00 via INTRAVENOUS
  Administered 2013-08-05: 1000 mL via INTRAVENOUS
  Administered 2013-08-06: 02:00:00 via INTRAVENOUS

## 2013-08-05 MED ORDER — PAROXETINE HCL 30 MG PO TABS
30.0000 mg | ORAL_TABLET | ORAL | Status: DC
  Administered 2013-08-05 – 2013-08-06 (×2): 30 mg via ORAL
  Filled 2013-08-05 (×3): qty 1

## 2013-08-05 MED ORDER — ONDANSETRON HCL 4 MG/2ML IJ SOLN: 4.0000 mg | Freq: Four times a day (QID) | INTRAMUSCULAR | Status: DC | PRN

## 2013-08-05 MED ORDER — ONDANSETRON HCL 4 MG PO TABS: 4.0000 mg | ORAL_TABLET | Freq: Four times a day (QID) | ORAL | Status: DC | PRN

## 2013-08-05 MED ORDER — CLINDAMYCIN PHOSPHATE 600 MG/50ML IV SOLN
600.0000 mg | Freq: Once | INTRAVENOUS | Status: AC
  Administered 2013-08-05: 600 mg via INTRAVENOUS
  Filled 2013-08-05: qty 50

## 2013-08-05 MED ORDER — ACETYLCYSTEINE 600 MG PO CAPS: 600.0000 mg | ORAL_CAPSULE | Freq: Two times a day (BID) | ORAL | Status: DC | PRN

## 2013-08-05 MED ORDER — DIVALPROEX SODIUM 125 MG PO CPSP
500.0000 mg | ORAL_CAPSULE | Freq: Every day | ORAL | Status: DC
  Administered 2013-08-05: 500 mg via ORAL
  Filled 2013-08-05 (×2): qty 4

## 2013-08-05 MED ORDER — SODIUM CHLORIDE 0.9 % IJ SOLN: 3.0000 mL | Freq: Two times a day (BID) | INTRAMUSCULAR | Status: DC

## 2013-08-05 MED ORDER — POLYETHYLENE GLYCOL 3350 17 G PO PACK
17.0000 g | PACK | Freq: Every morning | ORAL | Status: DC
  Administered 2013-08-05 – 2013-08-06 (×2): 17 g
  Filled 2013-08-05 (×2): qty 1

## 2013-08-05 MED ORDER — DIAZEPAM 5 MG PO TABS: 2.5000 mg | ORAL_TABLET | Freq: Three times a day (TID) | ORAL | Status: DC | PRN

## 2013-08-05 NOTE — Progress Notes (Signed)
[name] PROGRESS NOTE  [name] ZOX:[id number] DOB: [birth year] DOA: 08/05/2013 PCP: Loreen Freud, DO I have seen and examined pt who is a 27 yo admitted this am by Dr Conley Rolls with cerebral palsy, cared for at home by parents and caretakers, hx of hyperthyrodism, GAD, depression, incontinence, seen earlier 10/4 for possible aspiration, sent home, bought back to the ER as his caretaker though he had a 15 seconds of apnea, no loss of consciousness. Chest x-ray was negative for acute infiltrates, Patient was empirically placed on clindamycin. I have discussed patient with his mother [name] and at this time she agrees to starting tube feedings and keeping with no oral intake for now, and a agrees to go pending swallow study. She would also like nutritionist to talk with her prior to recommending tube feedings as she does not want patient to be on a high rate of TF-have consulted dietary, will follow. Patient is alert and denies any complaints this a.m.      Kela Millin  [name] Pager [phone]. If 7PM-7AM, please contact night-coverage at [email or web address], password [facility] 08/05/2013, 8:34 AM  LOS: 0 days

## 2013-08-05 NOTE — ED Notes (Signed)
Patient was d/c'd earlier this evening, According to care giver Pt had 20 second pause in his breathing, called 911, EMT found patient NAD

## 2013-08-05 NOTE — H&P (Signed)
[name] History and Physical  [name] ZOX:[id number] DOB: Mar 30, 1986    PCP:   Loreen Freud, DO   Chief Complaint: stop breathing.  HPI: [name] is an 27 y.o. male with hx of cerebral palsy, cared for at home by four caretakers, hx of hyperthyrodism, GAD, depression, incontinence, seen earlier today for possible aspiration, sent home, bought back to the ER as his caretaker though he had a 15 seconds of apnea.  She said he never loss consciousness and he himself recall the event.  No cyanosis reported.  She turn him over and he was doing well then.  No report of seizure activities.  There was no report of fever or chills.  Evalatuion in the ER included a normal serology and clear CXR.  Hospitalist was asked to admit this patient for apnea and possible aspiration PNA.  Rewiew of Systems:  He recalled the events, no shortness of breath and no LOC.  Past Medical History  Diagnosis Date  . Cerebral palsy   . GERD (gastroesophageal reflux disease)   . Anxiety   . Depression   . Thyroid disease     hyper  . Incontinence of feces   . Palpitations   . Esophagitis     Past Surgical History  Procedure Laterality Date  . Spine surgery  ,11/20/2010, 2011  . Eye surgery    . Ears tubes    . Hamstring released    . Baclofen trial    . Baslofen pump implant    . Spinal fusion    . G-tube insert  August 2006  . Spinal fusioncorrect 106 degree kyphosis    . Spinal fusion to correct 70 degree kyphosis    . Tonsillectomy    . Peg placement  10/21/2011    Procedure: PERCUTANEOUS ENDOSCOPIC GASTROSTOMY (PEG) REPLACEMENT;  Surgeon: Hart Carwin, MD;  Location: [facility];  Service: Endoscopy;  Laterality: N/A;  . Peg placement N/A 06/13/2013    Procedure: PERCUTANEOUS ENDOSCOPIC GASTROSTOMY (PEG) REPLACEMENT;  Surgeon: Hart Carwin, MD;  Location: [facility];  Service: Endoscopy;  Laterality: N/A;    Medications:  HOME MEDS: Prior to Admission medications    Medication Sig Start Date End Date Taking? Authorizing Provider  Acetylcysteine 600 MG CAPS Give 600 mg by tube 2 (two) times daily as needed (for headaches).    Yes Historical Provider, MD  AMBULATORY NON FORMULARY MEDICATION Medication Name: MIC gastrostomy/bolus feeding tube 24 French Part number 0110-24. #2 and 10 cc lurer lock syringe #2 Dx: 06/28/13  Yes Hart Carwin, MD  diazepam (VALIUM) 5 MG tablet Give 2.5-5 mg by tube 2 (two) times daily as needed for anxiety.    Yes Historical Provider, MD  divalproex (DEPAKOTE SPRINKLE) 125 MG capsule Give 250-500 mg by tube 2 (two) times daily. Takes 250mg  in morning and takes 500mg  at bedtime   Yes Historical Provider, MD  fluconazole (DIFLUCAN) 100 MG tablet Take 1 tablet (100 mg total) by mouth daily. 07/11/13  Yes Ranelle Oyster, MD  lidocaine (LIDODERM) 5 % Place 2 patches onto the skin daily. Apply to the low back. Remove & Discard patch within 12 hours or as directed by MD 07/11/13  Yes Ranelle Oyster, MD  Nutritional Supplements (ENSURE PO) Give 8 oz by tube daily as needed (for supplementation, only as needed).    Yes Historical Provider, MD  OLANZapine (ZYPREXA) 5 MG tablet Give 5 mg by tube at bedtime.   Yes Historical Provider, MD  Omeprazole-Sodium Bicarbonate (ZEGERID) 20-1100 MG CAPS capsule Give 1 capsule by tube daily.   Yes Historical Provider, MD  PARoxetine (PAXIL) 30 MG tablet Give 30 mg by tube every morning.    Yes Historical Provider, MD  polyethylene glycol (MIRALAX) packet Give 17 g by tube every morning.    Yes Historical Provider, MD  Probiotic Product (ADVANCED PROBIOTIC 10) CAPS Give 1 capsule by tube daily.    Yes Historical Provider, MD  sucralfate (CARAFATE) 1 G tablet 1 g by PEG Tube route every morning.   Yes Historical Provider, MD     Allergies:  Allergies  Allergen Reactions  . Sulfonamide Derivatives Rash    Social History:   reports that he has never smoked. He has never used smokeless tobacco. He  reports that he does not drink alcohol or use illicit drugs.  Family History: Family History  Problem Relation Age of Onset  . Asthma Mother   . Hyperlipidemia Mother   . Cancer Maternal Grandmother     breast  . Cancer Maternal Grandfather     prostate  . Heart disease Paternal Grandfather      Physical Exam: Filed Vitals:   08/05/13 0300 08/05/13 0315 08/05/13 0330 08/05/13 0510  BP: 121/59 117/69 111/67 108/47  Pulse: 92 105  91  Temp:    98.4 F (36.9 C)  TempSrc:    Oral  Resp:      Height:    5\' 2"  (1.575 m)  Weight:    47.7 kg (105 lb 2.6 oz)  SpO2: 93% 87%  97%   Blood pressure 108/47, pulse 91, temperature 98.4 F (36.9 C), temperature source Oral, resp. rate 18, height 5\' 2"  (1.575 m), weight 47.7 kg (105 lb 2.6 oz), SpO2 97.00%.  GEN:  Pleasant patient lying in the stretcher in no acute distress; cooperative with exam. PSYCH: unable to assess.  HEENT: Mucous membranes pink and anicteric; PERRLA; EOM intact; no cervical lymphadenopathy nor thyromegaly or carotid bruit; no JVD; There were no stridor. Neck is very supple. Breasts:: Not examined CHEST WALL: No tenderness CHEST: Normal respiration, clear to auscultation bilaterally.  HEART: Regular rate and rhythm.  There are no murmur, rub, or gallops.   BACK: No kyphosis or scoliosis; no CVA tenderness ABDOMEN: soft and non-tender; no masses, no organomegaly, normal abdominal bowel sounds; no pannus; no intertriginous candida. There is no rebound and no distention. Rectal Exam: Not done EXTREMITIES: spstic paralysis. Genitalia: not examined PULSES: 2+ and symmetric SKIN: Normal hydration no rash or ulceration CNS: Cranial nerves 2-12 grossly intact no focal lateralizing neurologic deficit.  No sensory loss.   Labs on Admission:  Basic Metabolic Panel:  Recent Labs Lab 08/05/13 0255  NA 143  K 3.7  CL 104  CO2 25  GLUCOSE 102*  BUN 11  CREATININE 0.52  CALCIUM 9.1   Liver Function Tests: No  results found for this basename: AST, ALT, ALKPHOS, BILITOT, PROT, ALBUMIN,  in the last 168 hours No results found for this basename: LIPASE, AMYLASE,  in the last 168 hours No results found for this basename: AMMONIA,  in the last 168 hours CBC:  Recent Labs Lab 08/05/13 0255  WBC 7.4  NEUTROABS 3.7  HGB 14.1  HCT 41.2  MCV 86.0  PLT 218   Cardiac Enzymes: No results found for this basename: CKTOTAL, CKMB, CKMBINDEX, TROPONINI,  in the last 168 hours  CBG: No results found for this basename: GLUCAP,  in the last 168 hours  Radiological Exams on Admission: Dg Chest 2 View  08/04/2013   *RADIOLOGY REPORT*  Clinical Data: Possible aspiration.  CHEST - 2 VIEW  Comparison: Chest radiograph July 04, 2013  Findings: Cardiomediastinal silhouette is unremarkable and unchanged.  No pleural effusions or focal consolidations. Pulmonary vasculature is unremarkable.  No pneumothorax. Harrington rods partially imaged.  Gastrostomy tube in included view of the abdomen. Tube projecting in the right lung apex may reflect a central venous catheter and is unchanged in position from prior examination.  IMPRESSION: No acute cardiopulmonary process.  Stable appearance of the chest from July 04, 2013.   Original Report Authenticated By: Awilda Metro    Assessment/Plan Present on Admission:  . CEREBRAL PALSY . ASPIRATION PNEUMONIA . Generalized anxiety disorder . PALSY, INFANTILE CEREBRAL, QUADRIPLEGIC . Unspecified urinary incontinence  PLAN:  I am not convinced that he had apnea, as he never lost consciousness.  He may have had some aspiration from the first choking episode.  The caretaker was very concerned about the episode.  It doesn't seem to be a seizure.  I will admit for observation.  Will start him on IV clindamycin for presumed aspiration. His home meds were renewed with no changes.  Other plans as per orders.  Code Status: FULL Unk Lightning, MD. [facility] Pager [phone] 7pm to 7am.  08/05/2013, 5:24 AM

## 2013-08-05 NOTE — Progress Notes (Signed)
New Admission Note:   Arrival Method: Via stretcher from ED Mental Orientation: Alert  Telemetry: Box 20 Assessment: Completed  Skin: Warm, dry and intact  IV: Clean, dry and intact. Fluids started  Pain: No complaints  Tubes: Peg tub, LQ Safety Measures: Safety Fall Plan discussed with caregiver  Admission: Completed  5 Chad  Orientation: Caregiver oriented to the room, unit and staff  Family: Caregiver at bedside, mother and father out of town on vacation. Aware of patient admission  Orders have been reviewed and implemented. Will continue to monitor the patient. Call light has been placed within reach and bed alarm has been activated.   Doristine Devoid, RN  Phone number: [phone]

## 2013-08-05 NOTE — ED Notes (Signed)
Pt was here earlier in the evening for choking on [employer] burger, Patient discharged and went home.  Per EMS patient got home and went to bed, caregiver witnessed Pt have 20 second pause of apnea, Upon arrival of EMS patient NAD, but brought in for evaluation.

## 2013-08-05 NOTE — Progress Notes (Signed)
INITIAL NUTRITION ASSESSMENT  DOCUMENTATION CODES Per approved criteria  -Not Applicable   INTERVENTION: 1. Initiate Osmolite 1.5 @ 20 ml/hr via PEG and increase by 10 ml every 4 hours to goal rate of 45 ml/hr. At goal rate, tube feeding regimen will provide 1620 kcal, 68 grams of protein, and 823 ml of H2O.  2. Free water bolus to meet hydration needs, additional 200 ml free water 4 times daily. This will provide a total of 800 ml water (1623 ml with TF).  NUTRITION DIAGNOSIS: Inadequate oral intake related to inability to eat as evidenced by NPO diet.   Goal: Meet >/=90% estimated nutrition needs.   Monitor:  Diet advance/swallow study, weight trends, labs, I/O's   Reason for Assessment: Consult, enteral nutrition initiation and management   27 y.o. male  Admitting Dx: <principal problem not specified>  ASSESSMENT: Pt admitted for possible apnea episode. Recent hospitalization for possible aspiration PNA.  Pt with hx of cerebral palsy. Has a G-tub per aid at bedside. They use the tube for medications and occasionally will put a shake down the tube if he is not eating well. Agreeable to enteral nutrition at this time while waiting on swallow eval.  Spoke with pt's mother by phone, agreeable to low rate enteral nutrition. Will order Osmolite 1.5 with a goal rate of 45 ml/hr.   Weight hx shows weight loss, about 10 lbs in the past month.   Height: Ht Readings from Last 1 Encounters:  08/05/13 5\' 2"  (1.575 m)    Weight: Wt Readings from Last 1 Encounters:  08/05/13 105 lb 2.6 oz (47.7 kg)    Ideal Body Weight: 118 lbs   % Ideal Body Weight: 89%  Wt Readings from Last 10 Encounters:  08/05/13 105 lb 2.6 oz (47.7 kg)  07/11/13 115 lb (52.164 kg)  07/04/13 115 lb (52.164 kg)  06/13/13 120 lb (54.432 kg)  06/13/13 120 lb (54.432 kg)  10/21/11 115 lb (52.164 kg)  10/21/11 115 lb (52.164 kg)  06/22/11 115 lb (52.164 kg)  11/11/10 115 lb (52.164 kg)  09/12/10 115 lb  (52.164 kg)    Usual Body Weight: 115 lbs   % Usual Body Weight: 91%  BMI:  Body mass index is 19.23 kg/(m^2). WNL   Estimated Nutritional Needs: Kcal: 1500-1700 Protein: 50-60 gm  Fluid: 1.5-1.7 L   Skin: intact   Diet Order: NPO  EDUCATION NEEDS: -No education needs identified at this time  No intake or output data in the 24 hours ending 08/05/13 1145  Last BM: intact    Labs:   Recent Labs Lab 08/05/13 0255 08/05/13 0934  NA 143  --   K 3.7  --   CL 104  --   CO2 25  --   BUN 11  --   CREATININE 0.52 0.53  CALCIUM 9.1  --   GLUCOSE 102*  --     CBG (last 3)  No results found for this basename: GLUCAP,  in the last 72 hours  Scheduled Meds: . clindamycin (CLEOCIN) IV  600 mg Intravenous Q8H  . divalproex  250 mg Oral Daily  . divalproex  500 mg Oral QHS  . fluconazole  100 mg Oral Daily  . heparin  5,000 Units Subcutaneous Q8H  . OLANZapine  5 mg Oral QHS  . pantoprazole  40 mg Oral Daily  . PARoxetine  30 mg Oral BH-q7a  . polyethylene glycol  17 g Per Tube q morning - 10a  . saccharomyces  boulardii  250 mg Oral BID  . sodium chloride  3 mL Intravenous Q12H    Continuous Infusions: . dextrose 5 % and 0.9% NaCl 800 mL (08/05/13 1610)    Past Medical History  Diagnosis Date  . Cerebral palsy   . GERD (gastroesophageal reflux disease)   . Anxiety   . Depression   . Thyroid disease     hyper  . Incontinence of feces   . Palpitations   . Esophagitis     Past Surgical History  Procedure Laterality Date  . Spine surgery  ,11/20/2010, 2011  . Eye surgery    . Ears tubes    . Hamstring released    . Baclofen trial    . Baslofen pump implant    . Spinal fusion    . G-tube insert  August 2006  . Spinal fusioncorrect 106 degree kyphosis    . Spinal fusion to correct 70 degree kyphosis    . Tonsillectomy    . Peg placement  10/21/2011    Procedure: PERCUTANEOUS ENDOSCOPIC GASTROSTOMY (PEG) REPLACEMENT;  Surgeon: Hart Carwin, MD;   Location: [facility];  Service: Endoscopy;  Laterality: N/A;  . Peg placement N/A 06/13/2013    Procedure: PERCUTANEOUS ENDOSCOPIC GASTROSTOMY (PEG) REPLACEMENT;  Surgeon: Hart Carwin, MD;  Location: [facility];  Service: Endoscopy;  Laterality: N/A;    Isabell Jarvis RD, LDN Pager [phone] After Hours pager [phone]

## 2013-08-05 NOTE — ED Provider Notes (Signed)
CSN: [id number]     Arrival date & time 08/04/13  2039 History   First MD Initiated Contact with Patient 08/04/13 2141     Chief Complaint  Patient presents with  . Choking   (Consider location/radiation/quality/duration/timing/severity/associated sxs/prior Treatment) HPI Comments: 27 yo male with CP, aspiration pneumonia hx presents with breathing difficult and 15 seconds of not breathing PTA.  Pt was recently seen by myself and discharged.  Caregiver felt he was worsening at home and stopped breathing but was still conscious.  No fevers.  No other changes.  Improved with time.    The history is provided by the patient and a relative.    Past Medical History  Diagnosis Date  . Cerebral palsy   . GERD (gastroesophageal reflux disease)   . Anxiety   . Depression   . Thyroid disease     hyper  . Incontinence of feces   . Palpitations   . Esophagitis    Past Surgical History  Procedure Laterality Date  . Spine surgery  ,11/20/2010, 2011  . Eye surgery    . Ears tubes    . Hamstring released    . Baclofen trial    . Baslofen pump implant    . Spinal fusion    . G-tube insert  August 2006  . Spinal fusioncorrect 106 degree kyphosis    . Spinal fusion to correct 70 degree kyphosis    . Tonsillectomy    . Peg placement  10/21/2011    Procedure: PERCUTANEOUS ENDOSCOPIC GASTROSTOMY (PEG) REPLACEMENT;  Surgeon: Hart Carwin, MD;  Location: [facility];  Service: Endoscopy;  Laterality: N/A;  . Peg placement N/A 06/13/2013    Procedure: PERCUTANEOUS ENDOSCOPIC GASTROSTOMY (PEG) REPLACEMENT;  Surgeon: Hart Carwin, MD;  Location: [facility];  Service: Endoscopy;  Laterality: N/A;   Family History  Problem Relation Age of Onset  . Asthma Mother   . Hyperlipidemia Mother   . Cancer Maternal Grandmother     breast  . Cancer Maternal Grandfather     prostate  . Heart disease Paternal Grandfather    History  Substance Use Topics  . Smoking status: Never Smoker   . Smokeless  tobacco: Never Used  . Alcohol Use: No    Review of Systems  Constitutional: Negative for fever and chills.  HENT: Negative for neck pain and neck stiffness.   Eyes: Negative for visual disturbance.  Respiratory: Positive for shortness of breath.   Cardiovascular: Negative for chest pain.  Gastrointestinal: Negative for vomiting and abdominal pain.  Genitourinary: Negative for dysuria and flank pain.  Musculoskeletal: Negative for back pain.  Skin: Negative for rash.  Neurological: Negative for light-headedness and headaches.    Allergies  Sulfonamide derivatives  Home Medications  No current outpatient prescriptions on file. BP 130/75  Pulse 103  Temp(Src) 98.8 F (37.1 C) (Oral)  Resp 18  SpO2 98% Physical Exam  Nursing note and vitals reviewed. Constitutional: He appears well-developed and well-nourished.  HENT:  Head: Normocephalic and atraumatic.  Eyes: Conjunctivae are normal. Right eye exhibits no discharge. Left eye exhibits no discharge.  Neck: Normal range of motion. Neck supple. No tracheal deviation present.  Cardiovascular: Normal rate and regular rhythm.   Pulmonary/Chest: Effort normal and breath sounds normal.  Abdominal: Soft. He exhibits no distension. There is no tenderness. There is no guarding.  Musculoskeletal: He exhibits no edema.  Neurological: He is alert.  CP, flexed upper extremities Alert, follows commands  Skin: Skin is warm. No  rash noted.  Psychiatric: He has a normal mood and affect.    ED Course  Procedures (including critical care time) Labs Review Labs Reviewed - No data to display Imaging Review Dg Chest 2 View  08/04/2013   *RADIOLOGY REPORT*  Clinical Data: Possible aspiration.  CHEST - 2 VIEW  Comparison: Chest radiograph July 04, 2013  Findings: Cardiomediastinal silhouette is unremarkable and unchanged.  No pleural effusions or focal consolidations. Pulmonary vasculature is unremarkable.  No pneumothorax. Harrington  rods partially imaged.  Gastrostomy tube in included view of the abdomen. Tube projecting in the right lung apex may reflect a central venous catheter and is unchanged in position from prior examination.  IMPRESSION: No acute cardiopulmonary process.  Stable appearance of the chest from July 04, 2013.   Original Report Authenticated By: Awilda Metro   Dg Abd 1 View  08/05/2013   CLINICAL DATA:  Evaluate PEG tube placement  EXAM: ABDOMEN - 1 VIEW  COMPARISON:  None.  FINDINGS: Gastrostomy tube is identified within the central abdomen. Barium injected through the gastrostomy tube opacifies the stomach and duodenal C loop. The bowel gas pattern appears nonobstructive. Gas and stool is noted throughout the colon up to the rectum. Scoliosis rods are identified within the thoracic and lumbar spine.  IMPRESSION: Gastrostomy tube is within the gastric lumen.   Electronically Signed   By: Signa Kell M.D.   On: 08/05/2013 07:08    MDM   1. Choking due to food in larynx, initial encounter    Recurrent concerning episode and with high risk hx discussed with hospitalist to observe. Clindamycin IV given.  Pt had no apnea events in ED.   Admitted  Aspiration event, dyspnea   Enid Skeens, MD 08/05/13 [phone]

## 2013-08-06 ENCOUNTER — Observation Stay (HOSPITAL_COMMUNITY): Payer: Managed Care, Other (non HMO)

## 2013-08-06 DIAGNOSIS — R1310 Dysphagia, unspecified: Secondary | ICD-10-CM

## 2013-08-06 DIAGNOSIS — D649 Anemia, unspecified: Secondary | ICD-10-CM

## 2013-08-06 DIAGNOSIS — R0609 Other forms of dyspnea: Secondary | ICD-10-CM

## 2013-08-06 DIAGNOSIS — F411 Generalized anxiety disorder: Secondary | ICD-10-CM | POA: Diagnosis not present

## 2013-08-06 DIAGNOSIS — T17908A Unspecified foreign body in respiratory tract, part unspecified causing other injury, initial encounter: Secondary | ICD-10-CM | POA: Diagnosis not present

## 2013-08-06 DIAGNOSIS — R0989 Other specified symptoms and signs involving the circulatory and respiratory systems: Secondary | ICD-10-CM

## 2013-08-06 DIAGNOSIS — G808 Other cerebral palsy: Secondary | ICD-10-CM | POA: Diagnosis not present

## 2013-08-06 DIAGNOSIS — G809 Cerebral palsy, unspecified: Secondary | ICD-10-CM

## 2013-08-06 DIAGNOSIS — K219 Gastro-esophageal reflux disease without esophagitis: Secondary | ICD-10-CM

## 2013-08-06 LAB — GLUCOSE, CAPILLARY
Glucose-Capillary: 109 mg/dL — ABNORMAL HIGH (ref 70–99)
Glucose-Capillary: 87 mg/dL (ref 70–99)
Glucose-Capillary: 78 mg/dL (ref 70–99)

## 2013-08-06 MED ORDER — OSMOLITE 1.5 CAL PO LIQD: ORAL | Status: DC

## 2013-08-06 MED ORDER — INFLUENZA VAC SPLIT QUAD 0.5 ML IM SUSP
0.5000 mL | INTRAMUSCULAR | Status: AC
  Administered 2013-08-06: 0.5 mL via INTRAMUSCULAR
  Filled 2013-08-06: qty 0.5

## 2013-08-06 NOTE — Progress Notes (Signed)
   CARE MANAGEMENT NOTE 08/06/2013  Patient:  EVYN, PUTZIER   Account Number:  [id number]  Date Initiated:  08/06/2013  Documentation initiated by:  [facility]  Subjective/Objective Assessment:   cerebral palsy     Action/Plan:   Anticipated DC Date:     Anticipated DC Plan:  HOME W HOME HEALTH SERVICES      DC Planning Services  CM consult      [facility] Choice  HOME HEALTH   Choice offered to / List presented to:  C-6 Parent   DME arranged  OTHER - SEE COMMENT      DME agency  [facility]     HH arranged  HH-1 RN      [facility] agency  [facility]   Status of service:  Completed, signed off Medicare Important Message given?   (If response is "NO", the following Medicare IM given date fields will be blank) Date Medicare IM given:   Date Additional Medicare IM given:    Discharge Disposition:  HOME W HOME HEALTH SERVICES  Per UR Regulation:    If discussed at Long Length of Stay Meetings, dates discussed:    Comments:  08/06/13 Pt currently receives round-the-clock care from [facility] who will continue to provide daily care. CM spoke to mother of patient, Bonita Quin [phone] to offer choice.  Bonita Quin chose [facility] for [facility] (for education and eval).  Referral was called to [place] at [facility].  Pt will need continuous feeding which will be provided by [facility] special[facility].  Jacki Cones from [place] [facility] will refer to the intake team on Monday when the office is opened.  Jacki Cones is with [place], [phone].  Bonita Quin requested I fax facesheet, H&P, Discharge summary, Nutritional consult, HH nutritional orders to [phone].  Info faxed to aforementioned as requested. CM called Bonita Quin to notify her of [facility] arrangements.  No other CM needs were communicated.  Freddy Jaksch, [place], [phone].

## 2013-08-06 NOTE — Procedures (Addendum)
Objective Swallowing Evaluation: Modified Barium Swallowing Study  Patient Details  Name: Joaquim Tolen MRN: [id number] Date of Birth: 11/02/1986  Today's Date: 08/06/2013 Time: 1230-1253 SLP Time Calculation (min): 23 min  Past Medical History:  Past Medical History  Diagnosis Date  . Cerebral palsy   . GERD (gastroesophageal reflux disease)   . Anxiety   . Depression   . Thyroid disease     hyper  . Incontinence of feces   . Palpitations   . Esophagitis    Past Surgical History:  Past Surgical History  Procedure Laterality Date  . Spine surgery  ,11/20/2010, 2011  . Eye surgery    . Ears tubes    . Hamstring released    . Baclofen trial    . Baslofen pump implant    . Spinal fusion    . G-tube insert  August 2006  . Spinal fusioncorrect 106 degree kyphosis    . Spinal fusion to correct 70 degree kyphosis    . Tonsillectomy    . Peg placement  10/21/2011    Procedure: PERCUTANEOUS ENDOSCOPIC GASTROSTOMY (PEG) REPLACEMENT;  Surgeon: Hart Carwin, MD;  Location: [facility];  Service: Endoscopy;  Laterality: N/A;  . Peg placement N/A 06/13/2013    Procedure: PERCUTANEOUS ENDOSCOPIC GASTROSTOMY (PEG) REPLACEMENT;  Surgeon: Hart Carwin, MD;  Location: [facility];  Service: Endoscopy;  Laterality: N/A;   HPI:   27 y/o male admitted this am by Dr Conley Rolls with cerebral palsy, cared for at home by parents and caretakers, hx of hyperthyrodism, GAD, depression, incontinence, seen earlier 10/4 for possible aspiration, sent home, bought back to the ER as his caretaker though he had a 15 seconds of apnea, no loss of consciousness. Chest x-ray was negative for acute infiltrates, Patient was empirically placed on clindamycin. I have discussed patient with his mother Bonita Quin and at this time she agrees to starting tube feedings and keeping with no oral intake for now, and a agrees to go pending swallow study. She would also like nutritionist to talk with her prior to recommending tube feedings  as she does not want patient to be on a high rate of TF-have consulted dietary, will follow.  PMH significant for posterior cervical spinal fusion 9/11 and 1/12 to correct kyphosis.  Patient referred for MBS to assess risk for aspiration and recommend safest, PO diet following recent choking episode.       Assessment / Plan / Recommendation Clinical Impression  Dysphagia Diagnosis: Severe oral phase dysphagia;Moderate pharyngeal phase dysphagia;Mild cervical esophageal phase dysphagia Patient with head back position posture requiring pillow and rolled towels to maintain neutral head position.  Continues with severe baseline oral phase dyphagia with decreased lingual coordination affecting bolus cohesion and oral transit.  Reduced labial seal resulting in anterior.  Severe lingual pumping noted with piecemeal swallows.  Limited mastication with soft solid trial but baseline based on information taken from prior MBS.   Moderate, combination of  structural and weakness, pharyngeal phase dysphagia.  Severe curvature of the spine affected complete epiglottic deflection resulting in incomplete airway closure.  Severe narrowing of pharynx affected bolus transit.   Silent penetration with eventual silent aspiration with all liquid consistencies and puree consistency during the swallow.  Patient unable to follow directions to complete throat clear/cough to remove aspirates.  Delayed wet cough at end of study.  No penetration or aspiration noted with mechanical soft solid but bolus resided on UES.  Whole barium tablet deferred as patient is administered medication  via PEG tube.    Safest, is NPO with continued primary nutrition/hydration and medication per gastrostomy tube.    Review of MBS completed in 2013 indicates mild pharyngeal dysphagia in comparison to moderate pharyngeal dysphagia noted this date.  Patient may benefit from dysphagia treatment in Out Patient basis vs. Home Health.  If GOC is to continue PO diet  with known risk recommend dysphagia 1 ( puree) consistency and thin liquids by straw with aspiration and reflux precautions.  No penetration or aspiration noted with mechanical soft solids but due to severity of oral phase and recent episode recommend puree as safest at this time.  Defer decision to initiate PO's to MD.      Treatment Recommendation  Therapy as outlined in treatment plan below    Diet Recommendation Alternative means - long-term;Thin liquid;Dysphagia 1 (Puree)   Liquid Administration via: Spoon;Straw Medication Administration: Via alternative means Supervision: Staff feed patient Compensations: Slow rate;Small sips/bites;Check for pocketing;Check for anterior loss Postural Changes and/or Swallow Maneuvers: Seated upright 90 degrees;Upright 30-60 min after meal    Other  Recommendations Oral Care Recommendations: Oral care before and after PO   Follow Up Recommendations  Outpatient SLP    Frequency and Duration min 1 x/week  1 week       SLP Swallow Goals Patient will utilize recommended strategies during swallow to increase swallowing safety with: Total assistance   General Date of Onset: 08/05/13 HPI: I have seen and examined pt who is a 27 yo admitted this am by Dr Conley Rolls with cerebral palsy, cared for at home by parents and caretakers, hx of hyperthyrodism, GAD, depression, incontinence, seen earlier 10/4 for possible aspiration, sent home, bought back to the ER as his caretaker though he had a 15 seconds of apnea, no loss of consciousness. Chest x-ray was negative for acute infiltrates, Patient was empirically placed on clindamycin. I have discussed patient with his mother Bonita Quin and at this time she agrees to starting tube feedings and keeping with no oral intake for now, and a agrees to go pending swallow study. She would also like nutritionist to talk with her prior to recommending tube feedings as she does not want patient to be on a high rate of TF-have consulted  dietary, will follow. Patient is alert and denies any complaints this a.m. Type of Study: Modified Barium Swallowing Study Reason for Referral: Objectively evaluate swallowing function Diet Prior to this Study: NPO;PEG tube Temperature Spikes Noted: No Respiratory Status: Room air Behavior/Cognition: Alert;Cooperative;Pleasant mood Oral Cavity - Dentition: Adequate natural dentition Oral Motor / Sensory Function: Impaired motor;Impaired sensory Self-Feeding Abilities: Total assist Patient Positioning: Upright in chair Baseline Vocal Quality: Clear Volitional Cough: Cognitively unable to elicit Volitional Swallow: Unable to elicit Anatomy: Other (Comment)    Reason for Referral Objectively evaluate swallowing function   Oral Phase Oral Preparation/Oral Phase Oral Phase: Impaired Oral - Honey Oral - Honey Teaspoon: Weak lingual manipulation;Lingual pumping;Incomplete tongue to palate contact;Reduced posterior propulsion;Piecemeal swallowing;Decreased velopharyngeal closure;Delayed oral transit;Lingual/palatal residue Oral - Nectar Oral - Nectar Teaspoon: Weak lingual manipulation;Lingual pumping;Incomplete tongue to palate contact;Reduced posterior propulsion;Holding of bolus;Piecemeal swallowing;Decreased velopharyngeal closure;Lingual/palatal residue;Delayed oral transit Oral - Thin Oral - Thin Teaspoon: Reduced posterior propulsion;Holding of bolus;Weak lingual manipulation;Lingual pumping;Incomplete tongue to palate contact;Lingual/palatal residue;Piecemeal swallowing;Decreased velopharyngeal closure;Delayed oral transit Oral - Thin Straw: Weak lingual manipulation;Lingual pumping;Incomplete tongue to palate contact;Reduced posterior propulsion;Holding of bolus;Lingual/palatal residue;Pocketing in anterior sulcus;Delayed oral transit;Piecemeal swallowing;Decreased velopharyngeal closure Oral - Solids Oral - Puree: Weak lingual manipulation;Reduced posterior  propulsion;Holding of  bolus;Lingual pumping;Incomplete tongue to palate contact;Decreased velopharyngeal closure;Piecemeal swallowing;Lingual/palatal residue;Delayed oral transit Oral - Mechanical Soft: Impaired mastication;Weak lingual manipulation;Lingual pumping;Incomplete tongue to palate contact;Right pocketing in lateral sulci;Reduced posterior propulsion;Holding of bolus;Left pocketing in lateral sulci;Lingual/palatal residue;Piecemeal swallowing;Decreased velopharyngeal closure;Delayed oral transit   Pharyngeal Phase Pharyngeal Phase Pharyngeal Phase: Impaired Pharyngeal - Honey Pharyngeal - Honey Teaspoon: Reduced epiglottic inversion;Reduced pharyngeal peristalsis;Reduced anterior laryngeal mobility;Reduced tongue base retraction;Reduced airway/laryngeal closure;Reduced laryngeal elevation;Penetration/Aspiration during swallow;Trace aspiration;Pharyngeal residue - cp segment;Pharyngeal residue - posterior pharnyx Penetration/Aspiration details (honey teaspoon): Material enters airway, passes BELOW cords without attempt by patient to eject out (silent aspiration) Pharyngeal - Nectar Pharyngeal - Nectar Teaspoon: Trace aspiration;Penetration/Aspiration during swallow;Reduced tongue base retraction;Reduced anterior laryngeal mobility;Reduced laryngeal elevation;Reduced epiglottic inversion;Pharyngeal residue - posterior pharnyx;Pharyngeal residue - cp segment Penetration/Aspiration details (nectar teaspoon): Material enters airway, passes BELOW cords without attempt by patient to eject out (silent aspiration) Pharyngeal - Thin Pharyngeal - Thin Teaspoon: Trace aspiration;Penetration/Aspiration during swallow;Reduced tongue base retraction;Reduced airway/laryngeal closure;Reduced anterior laryngeal mobility;Reduced laryngeal elevation;Reduced pharyngeal peristalsis;Delayed swallow initiation;Reduced epiglottic inversion;Pharyngeal residue - cp segment;Pharyngeal residue - posterior pharnyx Penetration/Aspiration  details (thin teaspoon): Material enters airway, passes BELOW cords without attempt by patient to eject out (silent aspiration) Pharyngeal - Thin Straw: Reduced airway/laryngeal closure;Reduced tongue base retraction;Penetration/Aspiration before swallow;Reduced laryngeal elevation;Reduced pharyngeal peristalsis;Reduced anterior laryngeal mobility;Reduced epiglottic inversion;Delayed swallow initiation;Penetration/Aspiration during swallow;Trace aspiration;Pharyngeal residue - posterior pharnyx;Pharyngeal residue - cp segment Penetration/Aspiration details (thin straw): Material enters airway, passes BELOW cords without attempt by patient to eject out (silent aspiration) Pharyngeal - Solids Pharyngeal - Puree: Reduced tongue base retraction;Penetration/Aspiration before swallow;Reduced laryngeal elevation;Reduced anterior laryngeal mobility;Reduced airway/laryngeal closure;Reduced pharyngeal peristalsis;Premature spillage to pyriform sinuses;Trace aspiration;Penetration/Aspiration during swallow;Pharyngeal residue - posterior pharnyx;Pharyngeal residue - cp segment Penetration/Aspiration details (puree): Material enters airway, passes BELOW cords without attempt by patient to eject out (silent aspiration) Pharyngeal - Mechanical Soft: Reduced anterior laryngeal mobility;Reduced epiglottic inversion;Reduced laryngeal elevation;Reduced airway/laryngeal closure;Reduced tongue base retraction;Pharyngeal residue - cp segment  Cervical Esophageal Phase    GO    Cervical Esophageal Phase Cervical Esophageal Phase: Impaired Cervical Esophageal Phase - Honey Honey Teaspoon: Reduced cricopharyngeal relaxation Cervical Esophageal Phase - Nectar Nectar Teaspoon: Reduced cricopharyngeal relaxation Cervical Esophageal Phase - Solids Puree: Reduced cricopharyngeal relaxation        Moreen Fowler MS, CCC-SLP [phone] [facility] 08/06/2013, 1:36 PM

## 2013-08-06 NOTE — Discharge Summary (Signed)
Physician Discharge Summary  Jabir Dahlem ZOX:[id number] DOB: [birth year] DOA: 08/05/2013  PCP: Loreen Freud, DO  Admit date: 08/05/2013 Discharge date: 08/06/2013  Time spent: >30 minutes  Recommendations for Outpatient Follow-up:  Follow-up Information   Follow up with Loreen Freud, DO. (in 1-2weeks, call for appt upon discharge)    Specialty:  Family Medicine   Contact information:   [phone] [address]. [facility] [address] [place] [address] [phone]        Discharge Diagnoses:  Active Problems:   Generalized anxiety disorder   PALSY, INFANTILE CEREBRAL, QUADRIPLEGIC   CEREBRAL PALSY   ASPIRATION PNEUMONIA   Unspecified urinary incontinence   Discharge Condition: Improved/stable  Diet recommendation: Tube feedings, Osmolite 1.5 at 45 cc per hour  Filed Weights   08/05/13 0510  Weight: 47.7 kg (105 lb 2.6 oz)    History of present illness:  [name] is an 27 y.o. male with hx of cerebral palsy, cared for at home by four caretakers, hx of hyperthyrodism, GAD, depression, incontinence, seen earlier today for possible aspiration, sent home, bought back to the ER as his caretaker though he had a 15 seconds of apnea. She said he never loss consciousness and he himself recall the event. No cyanosis reported. She turn him over and he was doing well then. No report of seizure activities. There was no report of fever or chills. Evalatuion in the ER included a normal serology and clear CXR. Hospitalist was asked to admit this patient for apnea and possible aspiration PNA.   Hospital Course:   . Severe dysphagia with ASPIRATION  -As discussed above, upon admission patient was empirically placed on antibiotics for possible aspiration pneumonia -Swallow evaluation was done today and patient has severe dysphagia with high risk of aspirating  -Patient Has no evidence of aspiration pneumonia, chest x-ray negative and he is afebrile hemodynamically stable with no  leukocytosis, would not require any further antibiotics on discharge. -Patient was placed on tube feedings beginning 10/4 which he has tolerated well>> will continue upon discharge as discussed with his mother.speech therapy is noted the high risk of aspiration and recommends n.p.o. but states pt/family absolutely on a by mouth was dysphagia 1 with thin liquids-but that patient would be aspirating even with this. I have  discussed with his mother and for now patient will be n.p.o. and she is to follow up with her primary care for followup repeat swallow eval this well as further decisions on by mouth intake . Generalized anxiety disorder -Continue outpatient medications  . Unspecified urinary incontinence . CEREBRAL PALSY  Procedures:  none  Consultations:  none  Discharge Exam: Filed Vitals:   08/06/13 0530  BP: 119/64  Pulse: 90  Temp: 98.7 F (37.1 C)  Resp: 18    Exam:  General: alert &appropriate Cardiovascular: RRR, nl S1 s2 Respiratory: CTAB Abdomen: soft +BS NT/ND, no masses palpable Extremities: No cyanosis and no edema, contracture deformities present   Discharge Instructions  Discharge Orders   Future Appointments Provider Department Dept Phone   08/10/2013 11:45 AM Ernestene Mention, MD [facility] [place] [phone]   08/22/2013 3:15 PM Hart Carwin, MD [facility] [phone]   Future Orders Complete By Expires   Discharge instructions  As directed    Comments:     Diet: Tube feedings Osmolite 1.5cal at 45 cc per hour continuous May try pure, but as discussed patient would still be aspirating evan with this.   Increase activity slowly  As directed        Medication List         Acetylcysteine 600 MG Caps  Give 600 mg by tube 2 (two) times daily as needed (for headaches).     ADVANCED PROBIOTIC 10 Caps  Give 1 capsule by tube daily.     AMBULATORY NON FORMULARY MEDICATION  - Medication Name: MIC  gastrostomy/bolus feeding tube 24 French Part number 0110-24. #2 and  - 10 cc lurer lock syringe #2  - Dx:     diazepam 5 MG tablet  Commonly known as:  VALIUM  Give 2.5-5 mg by tube 2 (two) times daily as needed for anxiety.     divalproex 125 MG capsule  Commonly known as:  DEPAKOTE SPRINKLE  Give 250-500 mg by tube 2 (two) times daily. Takes 250mg  in morning and takes 500mg  at bedtime     feeding supplement (OSMOLITE 1.5 CAL) Liqd  Continuous Osmolite1.5cal at 45cc/hr     fluconazole 100 MG tablet  Commonly known as:  DIFLUCAN  Take 1 tablet (100 mg total) by mouth daily.     lidocaine 5 %  Commonly known as:  LIDODERM  Place 2 patches onto the skin daily. Apply to the low back. Remove & Discard patch within 12 hours or as directed by MD     [facility] packet  Generic drug:  polyethylene glycol  Give 17 g by tube every morning.     OLANZapine 5 MG tablet  Commonly known as:  ZYPREXA  Give 5 mg by tube at bedtime.     Omeprazole-Sodium Bicarbonate 20-1100 MG Caps capsule  Commonly known as:  ZEGERID  Give 1 capsule by tube daily.     PARoxetine 30 MG tablet  Commonly known as:  PAXIL  Give 30 mg by tube every morning.     sucralfate 1 G tablet  Commonly known as:  CARAFATE  1 g by PEG Tube route every morning.       Allergies  Allergen Reactions  . Sulfonamide Derivatives Rash       Follow-up Information   Follow up with Loreen Freud, DO. (in 1-2weeks, call for appt upon discharge)    Specialty:  Family Medicine   Contact information:   [phone] [address] [facility] [address] [place] [address] [phone]        The results of significant diagnostics from this hospitalization (including imaging, microbiology, ancillary and laboratory) are listed below for reference.    Significant Diagnostic Studies: Dg Chest 2 View  08/04/2013   *RADIOLOGY REPORT*  Clinical Data: Possible aspiration.  CHEST - 2 VIEW  Comparison: Chest radiograph  July 04, 2013  Findings: Cardiomediastinal silhouette is unremarkable and unchanged.  No pleural effusions or focal consolidations. Pulmonary vasculature is unremarkable.  No pneumothorax. Harrington rods partially imaged.  Gastrostomy tube in included view of the abdomen. Tube projecting in the right lung apex may reflect a central venous catheter and is unchanged in position from prior examination.  IMPRESSION: No acute cardiopulmonary process.  Stable appearance of the chest from July 04, 2013.   Original Report Authenticated By: Awilda Metro   Dg Abd 1 View  08/05/2013   CLINICAL DATA:  Evaluate PEG tube placement  EXAM: ABDOMEN - 1 VIEW  COMPARISON:  None.  FINDINGS: Gastrostomy tube is identified within the central abdomen. Barium injected through the gastrostomy tube opacifies the stomach and duodenal C loop. The bowel gas pattern appears nonobstructive. Gas and stool is noted  throughout the colon up to the rectum. Scoliosis rods are identified within the thoracic and lumbar spine.  IMPRESSION: Gastrostomy tube is within the gastric lumen.   Electronically Signed   By: Signa Kell M.D.   On: 08/05/2013 07:08   Ir Replc Gastro/colonic Tube Percut W/fluoro  07/27/2013   *RADIOLOGY REPORT*  Indication: Poorly functioning gastrostomy tube, subsequent encounter.  FLUROSCOPIC GUIDED REPLACEMENT OF GASTROSTOMY TUBE  Comparison: Fluoroscopic-guided gastrostomy tube exchange - 02/21/2013  Intravenous Medications: None  Contrast: 10 ml Omnipaque-300, administered enterically  Fluoroscopy Time: 12 seconds.  Complications: None immediate  Technique / Findings:  The upper abdomen and external portion of the existing gastrostomy tube was prepped and draped in the usual sterile fashion, and a sterile drape was applied covering the operative field.  Maximum barrier sterile technique with sterile gowns and gloves were used for the procedure.  A timeout was performed prior to the initiation of the  procedure.  The existing gastrostomy tube was injected with a small amount of contrast confirming appropriate positioning within the gastric lumen.  The existing gastrostomy tube was removed and a new 24- French MIC balloon inflatable gastrostomy tube.  The balloon was inflated with approximately 12 ml of a mixture of saline and dilute contrast and the disc was cinched.  Contrast was injected and several spot fluoroscopic images were obtained in various obliquities to confirm appropriate intraluminal positioning.  A dressing was placed.  The patient tolerated the procedure well without immediate postprocedural complication.  Impression:  Successful fluoroscopic guided replacement of a new 24-French gastrostomy tube.  The new gastrostomy tube is ready for immediate use.   Original Report Authenticated By: Tacey Ruiz, MD   Dg Swallowing Func-speech Pathology  08/06/2013   Lacinda Axon, CCC-SLP     08/06/2013  2:26 PM Objective Swallowing Evaluation: Modified Barium Swallowing Study   Patient Details  Name: Avinash Maltos MRN: [id number] Date of Birth: 1985-11-23  Today's Date: 08/06/2013 Time: 1230-1253 SLP Time Calculation (min): 23 min  Past Medical History:  Past Medical History  Diagnosis Date  . Cerebral palsy   . GERD (gastroesophageal reflux disease)   . Anxiety   . Depression   . Thyroid disease     hyper  . Incontinence of feces   . Palpitations   . Esophagitis    Past Surgical History:  Past Surgical History  Procedure Laterality Date  . Spine surgery  ,11/20/2010, 2011  . Eye surgery    . Ears tubes    . Hamstring released    . Baclofen trial    . Baslofen pump implant    . Spinal fusion    . G-tube insert  August 2006  . Spinal fusioncorrect 106 degree kyphosis    . Spinal fusion to correct 70 degree kyphosis    . Tonsillectomy    . Peg placement  10/21/2011    Procedure: PERCUTANEOUS ENDOSCOPIC GASTROSTOMY (PEG)  REPLACEMENT;  Surgeon: Hart Carwin, MD;  Location: [facility]  ENDOSCOPY;  Service: Endoscopy;   Laterality: N/A;  . Peg placement N/A 06/13/2013    Procedure: PERCUTANEOUS ENDOSCOPIC GASTROSTOMY (PEG)  REPLACEMENT;  Surgeon: Hart Carwin, MD;  Location: [facility]  ENDOSCOPY;  Service: Endoscopy;  Laterality: N/A;   HPI:   27 y/o male admitted this am by Dr Conley Rolls with cerebral palsy, cared  for at home by parents and caretakers, hx of hyperthyrodism, GAD,  depression, incontinence, seen earlier 10/4 for possible  aspiration, sent home, bought back to the  ER as his caretaker  though he had a 15 seconds of apnea, no loss of consciousness.  Chest x-ray was negative for acute infiltrates, Patient was  empirically placed on clindamycin. I have discussed patient with  his mother Bonita Quin and at this time she agrees to starting tube  feedings and keeping with no oral intake for now, and a agrees to  go pending swallow study. She would also like nutritionist to  talk with her prior to recommending tube feedings as she does not  want patient to be on a high rate of TF-have consulted dietary,  will follow.  PMH significant for posterior cervical spinal  fusion 9/11 and 1/12 to correct kyphosis.  Patient referred for  MBS to assess risk for aspiration and recommend safest, PO diet  following recent choking episode.       Assessment / Plan / Recommendation Clinical Impression  Dysphagia Diagnosis: Severe oral phase dysphagia;Moderate  pharyngeal phase dysphagia;Mild cervical esophageal phase  dysphagia Patient with head back position posture requiring pillow and  rolled towels to maintain neutral head position.  Continues with severe baseline oral phase dyphagia with decreased  lingual coordination affecting bolus cohesion and oral transit.   Reduced labial seal resulting in anterior.  Severe lingual  pumping noted with piecemeal swallows.  Limited mastication with  soft solid trial but baseline based on information taken from  prior MBS.   Moderate, combination of  structural and weakness,  pharyngeal phase dysphagia.  Severe  curvature of the spine  affected complete epiglottic deflection resulting in incomplete  airway closure.  Severe narrowing of pharynx affected bolus  transit.   Silent penetration with eventual silent aspiration  with all liquid consistencies and puree consistency during the  swallow.  Patient unable to follow directions to complete throat  clear/cough to remove aspirates.  Delayed wet cough at end of  study.  No penetration or aspiration noted with mechanical soft  solid but bolus resided on UES.  Whole barium tablet deferred as  patient is administered medication via PEG tube.    Safest, is  NPO with continued primary nutrition/hydration and medication per  gastrostomy tube.    Review of MBS completed in 2013 indicates  mild pharyngeal dysphagia in comparison to moderate pharyngeal  dysphagia noted this date.  Patient may benefit from dysphagia  treatment in Out Patient basis vs. Home Health.  If GOC is to  continue PO diet with known risk recommend dysphagia 1 ( puree)  consistency and thin liquids by straw with aspiration and reflux  precautions.  No penetration or aspiration noted with mechanical  soft solids but due to severity of oral phase and recent episode  recommend puree as safest at this time.  Defer decision to  initiate PO's to MD.      Treatment Recommendation  Therapy as outlined in treatment plan below    Diet Recommendation Alternative means - long-term;Thin  liquid;Dysphagia 1 (Puree)   Liquid Administration via: Spoon;Straw Medication Administration: Via alternative means Supervision: Staff feed patient Compensations: Slow rate;Small sips/bites;Check for  pocketing;Check for anterior loss Postural Changes and/or Swallow Maneuvers: Seated upright 90  degrees;Upright 30-60 min after meal    Other  Recommendations Oral Care Recommendations: Oral care  before and after PO   Follow Up Recommendations  Outpatient SLP    Frequency and Duration min 1 x/week  1 week       SLP Swallow Goals Patient will  utilize recommended strategies during swallow to  increase  swallowing safety with: Total assistance   General Date of Onset: 08/05/13 HPI: I have seen and examined pt who is a 27 yo admitted this am  by Dr Conley Rolls with cerebral palsy, cared for at home by parents and  caretakers, hx of hyperthyrodism, GAD, depression, incontinence,  seen earlier 10/4 for possible aspiration, sent home, bought back  to the ER as his caretaker though he had a 15 seconds of apnea,  no loss of consciousness. Chest x-ray was negative for acute  infiltrates, Patient was empirically placed on clindamycin. I  have discussed patient with his mother Bonita Quin and at this time she  agrees to starting tube feedings and keeping with no oral intake  for now, and a agrees to go pending swallow study. She would also  like nutritionist to talk with her prior to recommending tube  feedings as she does not want patient to be on a high rate of  TF-have consulted dietary, will follow. Patient is alert and  denies any complaints this a.m. Type of Study: Modified Barium Swallowing Study Reason for Referral: Objectively evaluate swallowing function Diet Prior to this Study: NPO;PEG tube Temperature Spikes Noted: No Respiratory Status: Room air Behavior/Cognition: Alert;Cooperative;Pleasant mood Oral Cavity - Dentition: Adequate natural dentition Oral Motor / Sensory Function: Impaired motor;Impaired sensory Self-Feeding Abilities: Total assist Patient Positioning: Upright in chair Baseline Vocal Quality: Clear Volitional Cough: Cognitively unable to elicit Volitional Swallow: Unable to elicit Anatomy: Other (Comment)    Reason for Referral Objectively evaluate swallowing function   Oral Phase Oral Preparation/Oral Phase Oral Phase: Impaired Oral - Honey Oral - Honey Teaspoon: Weak lingual manipulation;Lingual  pumping;Incomplete tongue to palate contact;Reduced posterior  propulsion;Piecemeal swallowing;Decreased velopharyngeal  closure;Delayed oral  transit;Lingual/palatal residue Oral - Nectar Oral - Nectar Teaspoon: Weak lingual manipulation;Lingual  pumping;Incomplete tongue to palate contact;Reduced posterior  propulsion;Holding of bolus;Piecemeal swallowing;Decreased  velopharyngeal closure;Lingual/palatal residue;Delayed oral  transit Oral - Thin Oral - Thin Teaspoon: Reduced posterior propulsion;Holding of  bolus;Weak lingual manipulation;Lingual pumping;Incomplete tongue  to palate contact;Lingual/palatal residue;Piecemeal  swallowing;Decreased velopharyngeal closure;Delayed oral transit Oral - Thin Straw: Weak lingual manipulation;Lingual  pumping;Incomplete tongue to palate contact;Reduced posterior  propulsion;Holding of bolus;Lingual/palatal residue;Pocketing in  anterior sulcus;Delayed oral transit;Piecemeal  swallowing;Decreased velopharyngeal closure Oral - Solids Oral - Puree: Weak lingual manipulation;Reduced posterior  propulsion;Holding of bolus;Lingual pumping;Incomplete tongue to  palate contact;Decreased velopharyngeal closure;Piecemeal  swallowing;Lingual/palatal residue;Delayed oral transit Oral - Mechanical Soft: Impaired mastication;Weak lingual  manipulation;Lingual pumping;Incomplete tongue to palate  contact;Right pocketing in lateral sulci;Reduced posterior  propulsion;Holding of bolus;Left pocketing in lateral  sulci;Lingual/palatal residue;Piecemeal swallowing;Decreased  velopharyngeal closure;Delayed oral transit   Pharyngeal Phase Pharyngeal Phase Pharyngeal Phase: Impaired Pharyngeal - Honey Pharyngeal - Honey Teaspoon: Reduced epiglottic inversion;Reduced  pharyngeal peristalsis;Reduced anterior laryngeal  mobility;Reduced tongue base retraction;Reduced airway/laryngeal  closure;Reduced laryngeal elevation;Penetration/Aspiration during  swallow;Trace aspiration;Pharyngeal residue - cp  segment;Pharyngeal residue - posterior pharnyx Penetration/Aspiration details (honey teaspoon): Material enters  airway, passes BELOW cords  without attempt by patient to eject  out (silent aspiration) Pharyngeal - Nectar Pharyngeal - Nectar Teaspoon: Trace  aspiration;Penetration/Aspiration during swallow;Reduced tongue  base retraction;Reduced anterior laryngeal mobility;Reduced  laryngeal elevation;Reduced epiglottic inversion;Pharyngeal  residue - posterior pharnyx;Pharyngeal residue - cp segment Penetration/Aspiration details (nectar teaspoon): Material enters  airway, passes BELOW cords without attempt by patient to eject  out (silent aspiration) Pharyngeal - Thin Pharyngeal - Thin Teaspoon: Trace  aspiration;Penetration/Aspiration during swallow;Reduced tongue  base retraction;Reduced airway/laryngeal closure;Reduced anterior  laryngeal mobility;Reduced laryngeal elevation;Reduced pharyngeal  peristalsis;Delayed swallow initiation;Reduced epiglottic  inversion;Pharyngeal residue - cp segment;Pharyngeal residue -  posterior pharnyx Penetration/Aspiration details (thin teaspoon): Material enters  airway, passes BELOW cords without attempt by patient to eject  out (silent aspiration) Pharyngeal - Thin Straw: Reduced airway/laryngeal closure;Reduced  tongue base retraction;Penetration/Aspiration before  swallow;Reduced laryngeal elevation;Reduced pharyngeal  peristalsis;Reduced anterior laryngeal mobility;Reduced  epiglottic inversion;Delayed swallow  initiation;Penetration/Aspiration during swallow;Trace  aspiration;Pharyngeal residue - posterior pharnyx;Pharyngeal  residue - cp segment Penetration/Aspiration details (thin straw): Material enters  airway, passes BELOW cords without attempt by patient to eject  out (silent aspiration) Pharyngeal - Solids Pharyngeal - Puree: Reduced tongue base  retraction;Penetration/Aspiration before swallow;Reduced  laryngeal elevation;Reduced anterior laryngeal mobility;Reduced  airway/laryngeal closure;Reduced pharyngeal peristalsis;Premature  spillage to pyriform sinuses;Trace  aspiration;Penetration/Aspiration  during swallow;Pharyngeal  residue - posterior pharnyx;Pharyngeal residue - cp segment Penetration/Aspiration details (puree): Material enters airway,  passes BELOW cords without attempt by patient to eject out  (silent aspiration) Pharyngeal - Mechanical Soft: Reduced anterior laryngeal  mobility;Reduced epiglottic inversion;Reduced laryngeal  elevation;Reduced airway/laryngeal closure;Reduced tongue base  retraction;Pharyngeal residue - cp segment  Cervical Esophageal Phase    GO    Cervical Esophageal Phase Cervical Esophageal Phase: Impaired Cervical Esophageal Phase - Honey Honey Teaspoon: Reduced cricopharyngeal relaxation Cervical Esophageal Phase - Nectar Nectar Teaspoon: Reduced cricopharyngeal relaxation Cervical Esophageal Phase - Solids Puree: Reduced cricopharyngeal relaxation        Moreen Fowler MS, CCC-SLP [phone] [facility] 08/06/2013, 1:36 PM     Microbiology: No results found for this or any previous visit (from the past 240 hour(s)).   Labs: Basic Metabolic Panel:  Recent Labs Lab 08/05/13 0255 08/05/13 0934  NA 143  --   K 3.7  --   CL 104  --   CO2 25  --   GLUCOSE 102*  --   BUN 11  --   CREATININE 0.52 0.53  CALCIUM 9.1  --    Liver Function Tests: No results found for this basename: AST, ALT, ALKPHOS, BILITOT, PROT, ALBUMIN,  in the last 168 hours No results found for this basename: LIPASE, AMYLASE,  in the last 168 hours No results found for this basename: AMMONIA,  in the last 168 hours CBC:  Recent Labs Lab 08/05/13 0255 08/05/13 0934  WBC 7.4 7.8  NEUTROABS 3.7  --   HGB 14.1 13.7  HCT 41.2 39.7  MCV 86.0 85.6  PLT 218 212   Cardiac Enzymes: No results found for this basename: CKTOTAL, CKMB, CKMBINDEX, TROPONINI,  in the last 168 hours BNP: BNP (last 3 results) No results found for this basename: PROBNP,  in the last 8760 hours CBG:  Recent Labs Lab 08/05/13 1621 08/05/13 2036 08/05/13 2355 08/06/13 0423 08/06/13 0801  GLUCAP 113*  101* 127* 109* 87       Signed:  Duriel Deery C  [name] 08/06/2013, 2:50 PM

## 2013-08-06 NOTE — Progress Notes (Signed)
NURSING PROGRESS NOTE  [name] [id number] Discharge Data: 08/06/2013 5:11 PM Attending Provider: Kela Millin, MD QMV:[id number] Laury Axon, DO     Keturah Shavers to be D/C'd Home per MD order.  Discussed with the patient the After Visit Summary and all questions fully answered. All IV's discontinued with no bleeding noted. All belongings returned to patient for patient to take home.   Last Vital Signs:  Blood pressure 119/64, pulse 90, temperature 98.7 F (37.1 C), temperature source Oral, resp. rate 18, height 5\' 2"  (1.575 m), weight 47.7 kg (105 lb 2.6 oz), SpO2 96.00%.  Discharge Medication List   Medication List         Acetylcysteine 600 MG Caps  Give 600 mg by tube 2 (two) times daily as needed (for headaches).     ADVANCED PROBIOTIC 10 Caps  Give 1 capsule by tube daily.     AMBULATORY NON FORMULARY MEDICATION  - Medication Name: MIC gastrostomy/bolus feeding tube 24 French Part number 0110-24. #2 and  - 10 cc lurer lock syringe #2  - Dx:     diazepam 5 MG tablet  Commonly known as:  VALIUM  Give 2.5-5 mg by tube 2 (two) times daily as needed for anxiety.     divalproex 125 MG capsule  Commonly known as:  DEPAKOTE SPRINKLE  Give 250-500 mg by tube 2 (two) times daily. Takes 250mg  in morning and takes 500mg  at bedtime     feeding supplement (OSMOLITE 1.5 CAL) Liqd  Continuous Osmolite1.5cal at 45cc/hr     fluconazole 100 MG tablet  Commonly known as:  DIFLUCAN  Take 1 tablet (100 mg total) by mouth daily.     lidocaine 5 %  Commonly known as:  LIDODERM  Place 2 patches onto the skin daily. Apply to the low back. Remove & Discard patch within 12 hours or as directed by MD     [facility] packet  Generic drug:  polyethylene glycol  Give 17 g by tube every morning.     OLANZapine 5 MG tablet  Commonly known as:  ZYPREXA  Give 5 mg by tube at bedtime.     Omeprazole-Sodium Bicarbonate 20-1100 MG Caps capsule  Commonly known as:  ZEGERID  Give 1 capsule by  tube daily.     PARoxetine 30 MG tablet  Commonly known as:  PAXIL  Give 30 mg by tube every morning.     sucralfate 1 G tablet  Commonly known as:  CARAFATE  1 g by PEG Tube route every morning.

## 2013-08-07 ENCOUNTER — Other Ambulatory Visit: Payer: Self-pay | Admitting: Family Medicine

## 2013-08-07 ENCOUNTER — Other Ambulatory Visit: Payer: Self-pay | Admitting: Internal Medicine

## 2013-08-07 NOTE — Care Management Note (Signed)
    Page 1 of 1   08/07/2013     5:21:40 PM   CARE MANAGEMENT NOTE 08/07/2013  Patient:  [name], [name]   Account Number:  [id number]  Date Initiated:  08/07/2013  Documentation initiated by:  PARROTT,CYNTHIA  Subjective/Objective Assessment:     Action/Plan:   Anticipated DC Date:     Anticipated DC Plan:           Choice offered to / List presented to:             Status of service:   Medicare Important Message given?   (If response is "NO", the following Medicare IM given date fields will be blank) Date Medicare IM given:   Date Additional Medicare IM given:    Discharge Disposition:    Per UR Regulation:    If discussed at Long Length of Stay Meetings, dates discussed:    Comments:  Called by patients mother regarding DME, no contact from [facility] has been made.  I called [facility] and the referral was not received.  I refaxed the documents to [phone].

## 2013-08-07 NOTE — Progress Notes (Signed)
   CARE MANAGEMENT NOTE 08/07/2013  Patient:  Bettenhausen,Lennox   Account Number:  [id number]  Date Initiated:  08/06/2013  Documentation initiated by:  Iliza Blankenbeckler  Subjective/Objective Assessment:   cerebral palsy     Action/Plan:   Anticipated DC Date:  08/06/2013   Anticipated DC Plan:  HOME W HOME HEALTH SERVICES      DC Planning Services  CM consult      PAC Choice  HOME HEALTH   Choice offered to / List presented to:  C-6 Parent   DME arranged  OTHER - SEE COMMENT      DME agency  [facility]     HH arranged  HH-1 RN      HH agency  [facility]   Status of service:  Completed, signed off Medicare Important Message given?   (If response is "NO", the following Medicare IM given date fields will be blank) Date Medicare IM given:   Date Additional Medicare IM given:    Discharge Disposition:  HOME W HOME HEALTH SERVICES  Per UR Regulation:    If discussed at Long Length of Stay Meetings, dates discussed:    Comments:  08/07/2013 1640 Contacted [facility] and they have not received. The referral has been faxed 3x with confirmation it was received. NCM contacted Kelly with [facility]. She states she will follow up and requested info be faxed directly to her. Received message from Kelly and she is working with [facility] to expedite insurance approval. Waiting for confirmation from [facility] that Tube Feedings are set up. [facility] was notified of pt's dc home. They are providing HH RN. Noely Kuhnle RN CCM Case Mgmt phone [phone]  08/06/13 Pt currently receives round-the-clock care from [facility] who will continue to provide daily care. CM spoke to mother of patient, Linda, [phone] to offer choice.  Linda chose [facility] for HHRN (for education and eval).  Referral was called to Jaime at [facility].  Pt will need continuous feeding which will be provided by [facility].  Laurie from [facility] will refer to the intake team on Monday when  the office is opened.  Laurie is with [facility], [phone].  Linda requested I fax facesheet, H&P, Discharge summary, Nutritional consult, HH nutritional orders to [phone].  Info faxed to aforementioned as requested. CM called Linda to notify her of HH arrangements.  No other CM needs were communicated.  Sarah Jeffries, BSN,CM  

## 2013-08-07 NOTE — Telephone Encounter (Signed)
Last seen 06/12/13 and filled 05/04/13 #30 with 2 refills. Please advise     KP

## 2013-08-08 ENCOUNTER — Telehealth: Payer: Self-pay | Admitting: Family Medicine

## 2013-08-08 NOTE — Telephone Encounter (Signed)
Spoke with Bonita Quin and she said [name] aspirated during sinner and he did not have pneumonia, but he blacked out after nurse bought him home, his dysphagia has gotten worst. He is receiving home care from [facility] and the insurance company will not pay for what he needs which is a feeding pump. Patient is trying to get the Rx through Medicare now and would like for you to re-issue the request for the feeding pump. She is currently feeding him by bolus but he is choking on the food. I advised the patient to call [facility] and get the nurse to call over the order and then Dr.Lowne will sign off on it. I made her aware I have already discussed with Dr.Lowne and she approved it.     KP

## 2013-08-08 NOTE — Telephone Encounter (Signed)
Patient mother called ans stated that [name] just got out the hospital and wanted to talk to kim about it. Mother would not go into details with me just wanted to talk to kim. cm

## 2013-08-10 ENCOUNTER — Ambulatory Visit (INDEPENDENT_AMBULATORY_CARE_PROVIDER_SITE_OTHER): Payer: Managed Care, Other (non HMO) | Admitting: Family Medicine

## 2013-08-10 ENCOUNTER — Ambulatory Visit (INDEPENDENT_AMBULATORY_CARE_PROVIDER_SITE_OTHER): Payer: Managed Care, Other (non HMO) | Admitting: General Surgery

## 2013-08-10 ENCOUNTER — Encounter: Payer: Self-pay | Admitting: Family Medicine

## 2013-08-10 VITALS — BP 92/64 | HR 73 | Temp 98.0°F

## 2013-08-10 DIAGNOSIS — R1312 Dysphagia, oropharyngeal phase: Secondary | ICD-10-CM

## 2013-08-10 NOTE — Assessment & Plan Note (Signed)
Severe oral phase Recommend con't Tube feeds per nutritionist recommendations

## 2013-08-10 NOTE — Progress Notes (Signed)
  Subjective:    Patient ID: [name], male    DOB: Dec 08, 1985, 27 y.o.   MRN: [id number]  HPI Pt is here with his mom to discuss recent ED visit ---pt was diagnosed with severe dysphagia and con't tube feeds were recommended.  Mom said she was ok with bolus feeds but pt prefers con't.  [facility] has been out to the house and evaluated the patient.  They will be sending over their recommendations.      Review of Systems As above    Objective:   Physical Exam BP 92/64  Pulse 73  Temp(Src) 98 F (36.7 C) (Oral)  SpO2 95% General appearance: alert, cooperative, appears stated age and no distress Lungs: clear to auscultation bilaterally   ER visit reviewed     Assessment & Plan:

## 2013-08-15 IMAGING — CR DG LUMBAR SPINE COMPLETE 4+V
5 series · 5 of 5 positions shown · non-contrast
Comparison: 02/18/2012.

CLINICAL DATA: Hip and back pain.

LUMBAR SPINE - COMPLETE 4+ VIEW

[t l-spine a.p.]
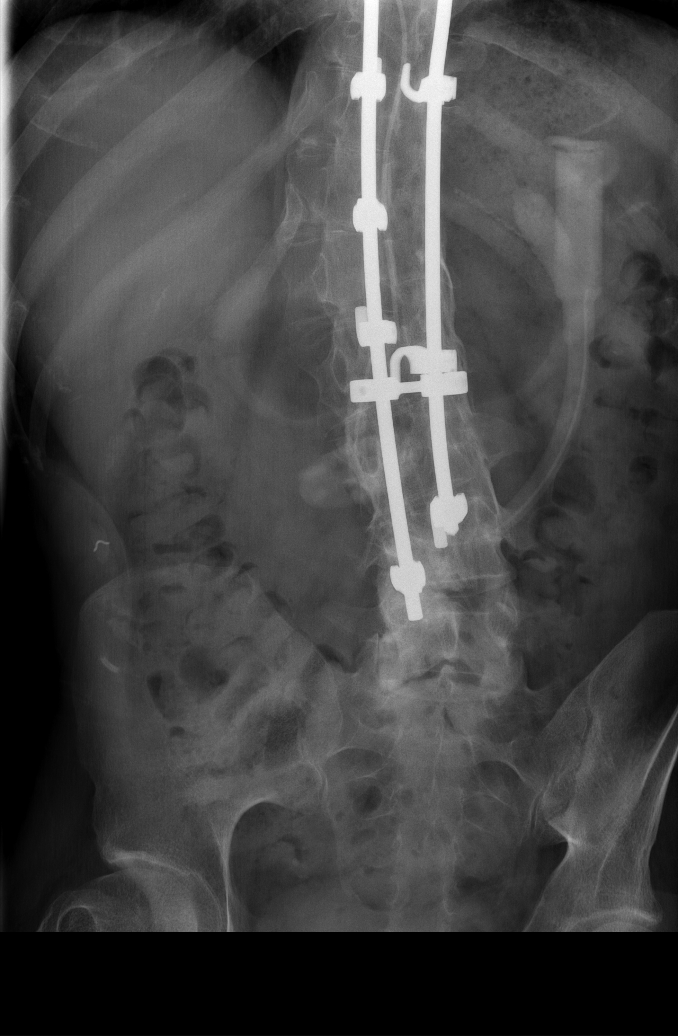

[t l-spine oblique exposure (1 of 2)]
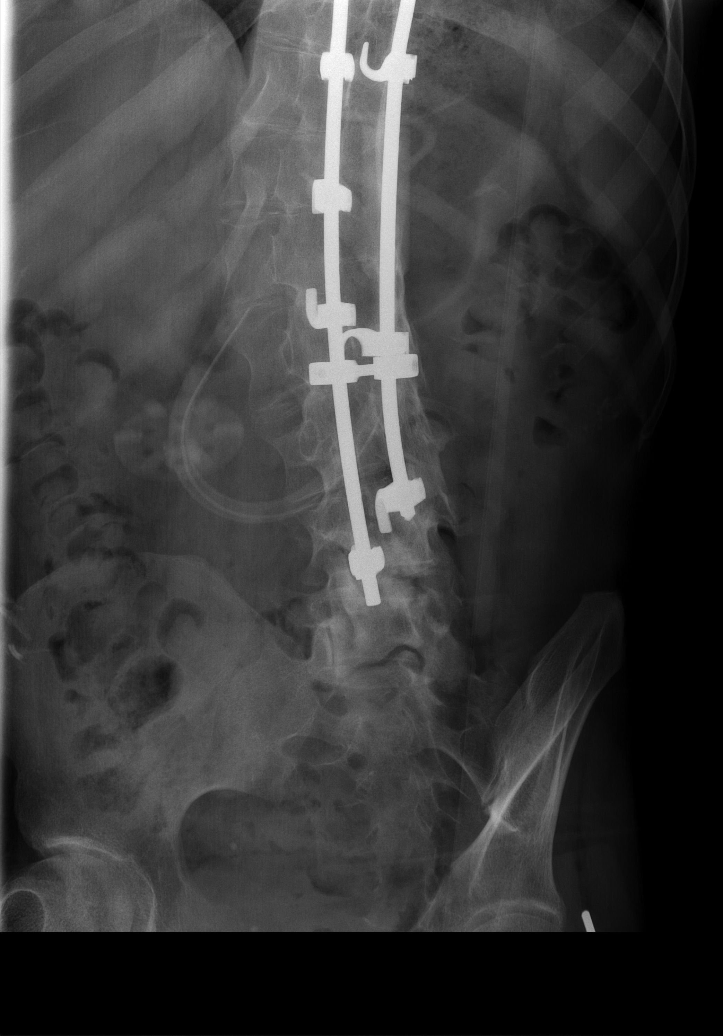

[t l-spine oblique exposure (2 of 2)]
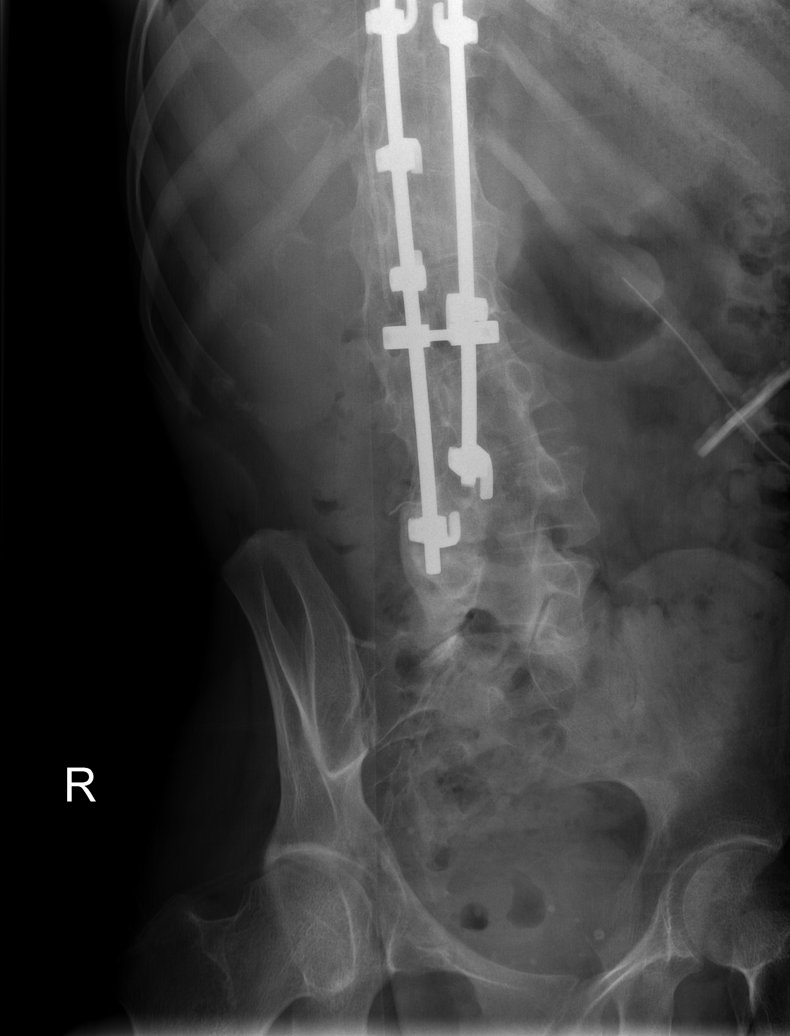

[t l-spine lat]
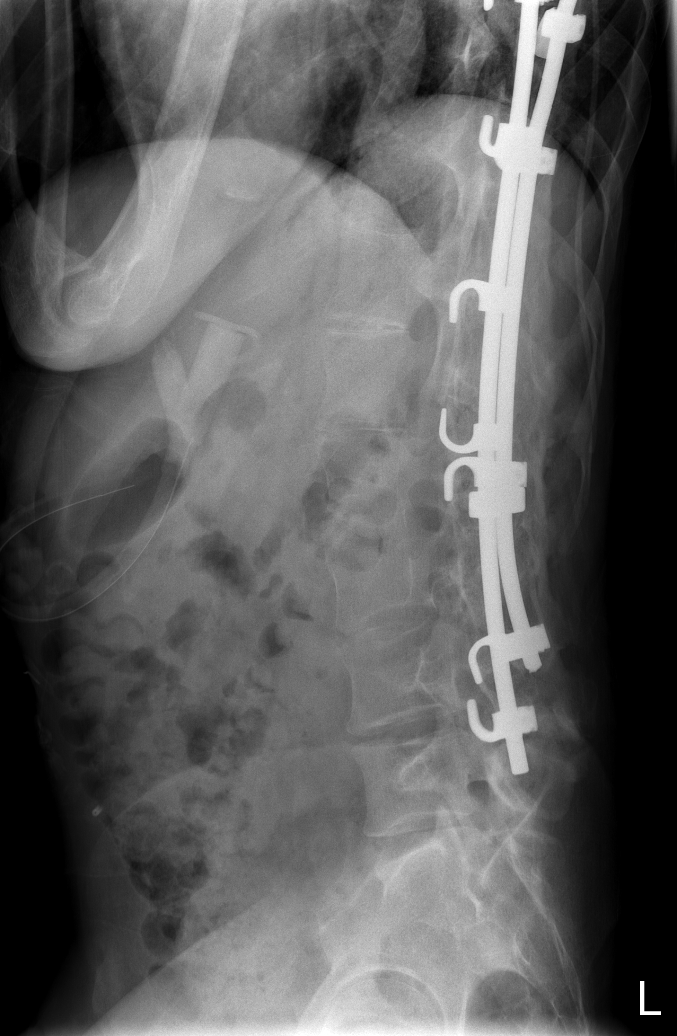

[t l-spine l5-s1 spot]
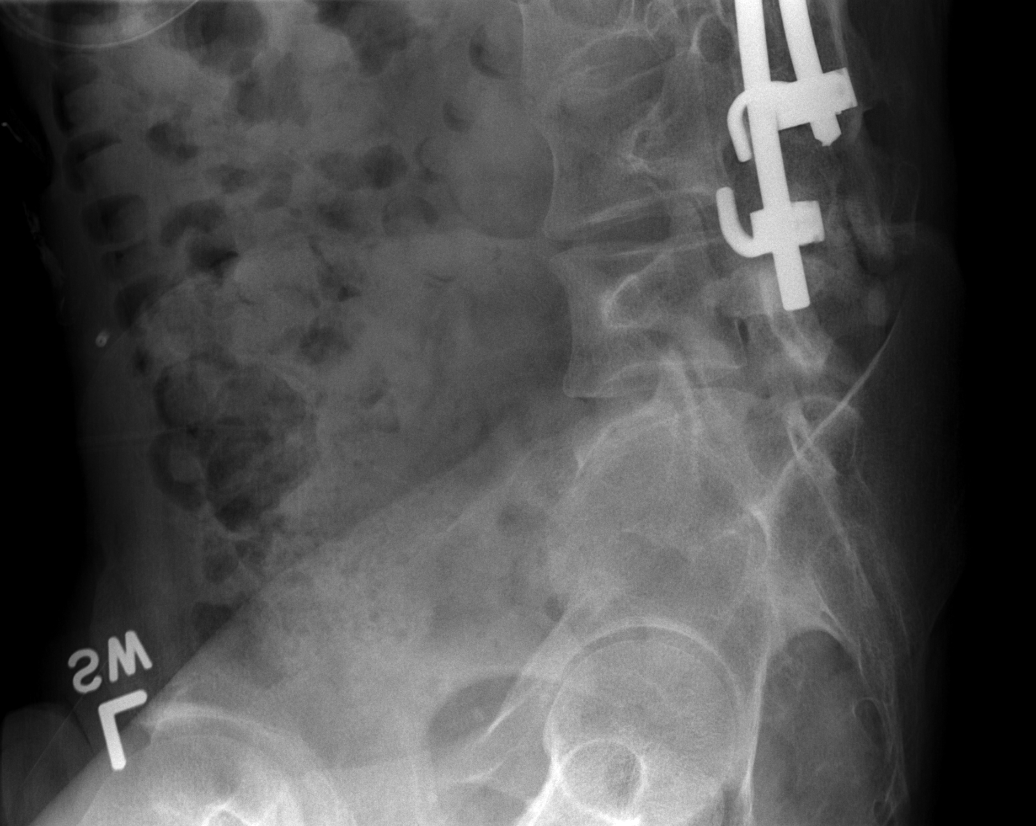

[5 of 5 positions shown; findings below may reference images not displayed]

FINDINGS: Harrington rod fixation is similar to the prior exam,
without hardware complication or change in spinal alignment.  The
superior aspect of Harrington rods is not visualized.  Gastrostomy
tube is present.  Sacral arcades appear within normal limits.
There is a dysmorphic appearance of the right femoral neck,
partially visualized. Vertebral body height appears preserved.
Stool burden is mild to moderate.
IMPRESSION: Unchanged Harrington rod fixation of the thoracolumbar spine.  No
hardware complication or acute osseous abnormality.

## 2013-08-15 IMAGING — CR DG HIP COMPLETE 2+V*R*
3 series · 3 of 3 positions shown · non-contrast
Comparison: None.

CLINICAL DATA: Right hip pain.  Right sided low back pain.
Cerebral palsy.

RIGHT HIP - COMPLETE 2+ VIEW

[t pelvis a.p.]
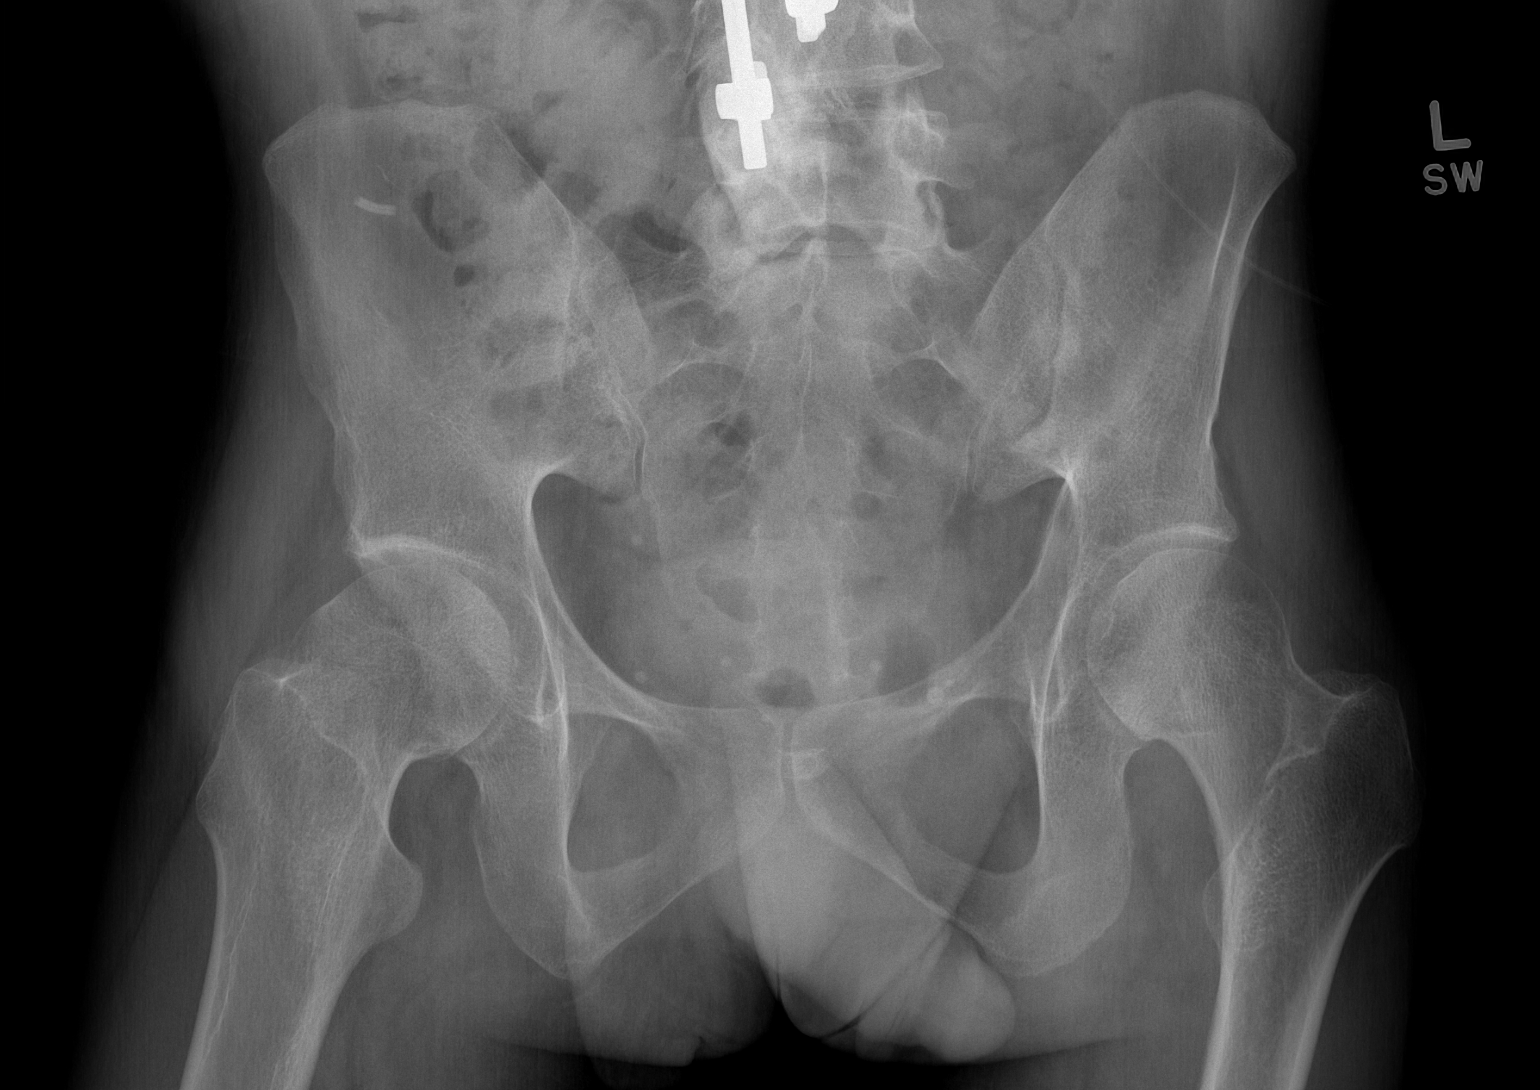

[t hip ap right]
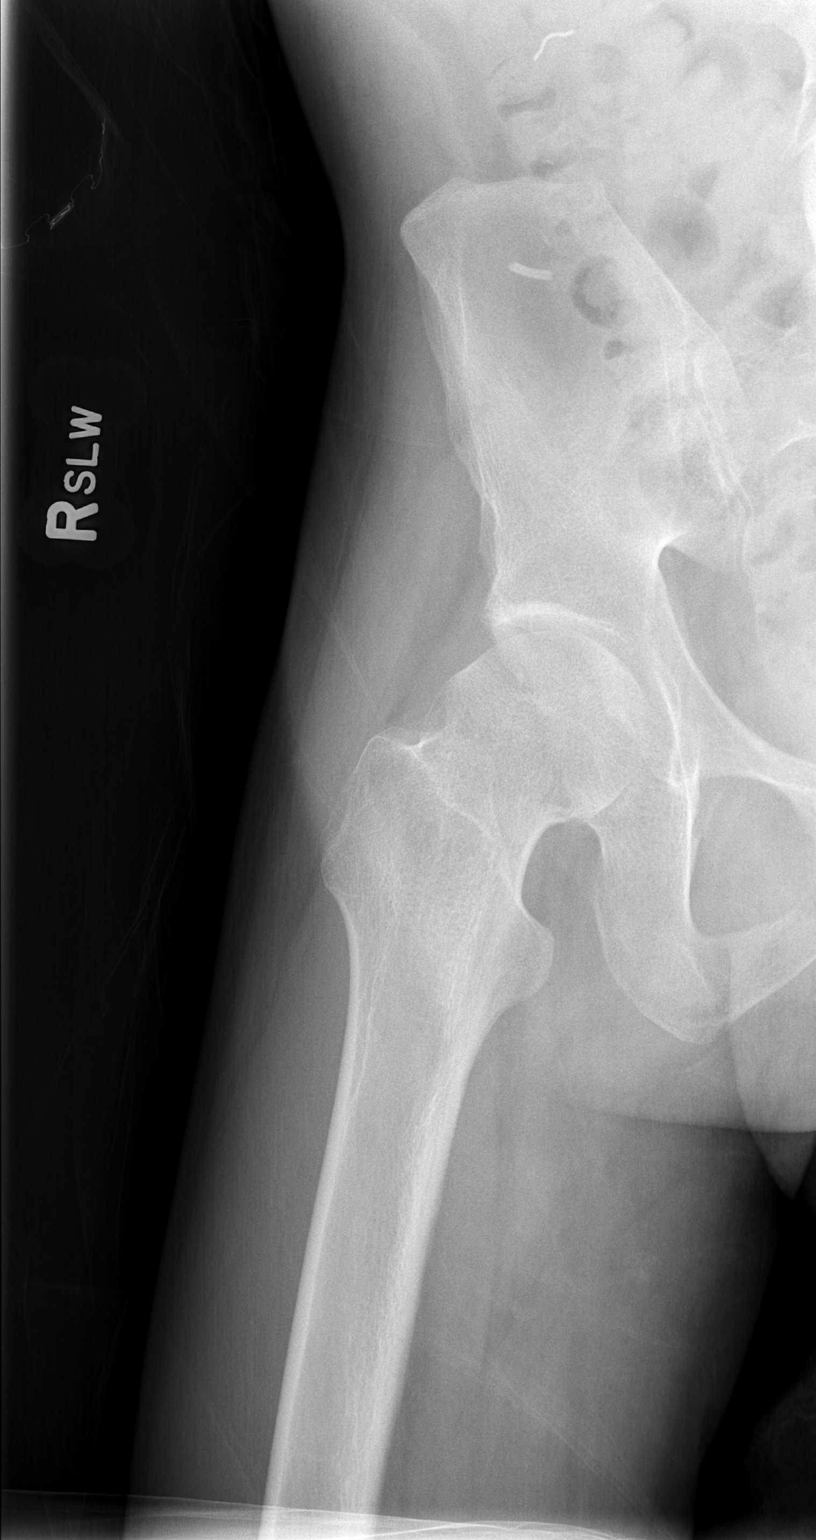

[t hip frog leg right]
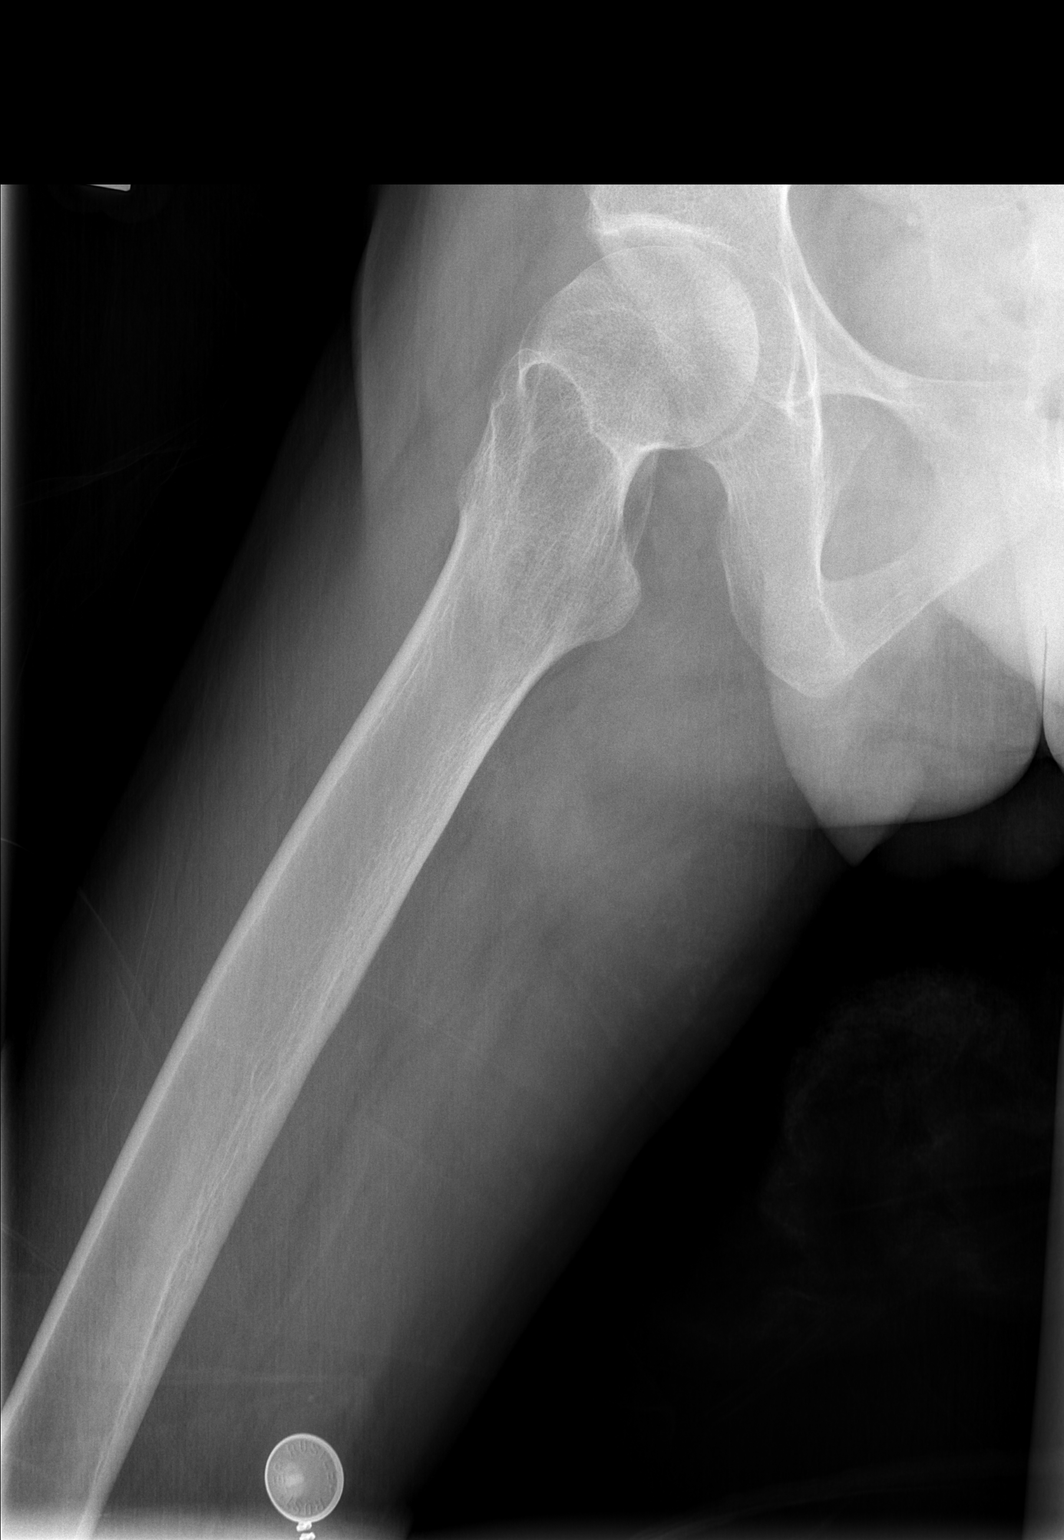

[3 of 3 positions shown; findings below may reference images not displayed]

FINDINGS: No interval change in the appearance of the pelvis or the
right hip with right coxa valga.  The partially visualized
Harrington rods.  Stool burden is normal.  Pelvic rings intact.
IMPRESSION: No interval change or acute abnormality.

## 2013-08-22 ENCOUNTER — Ambulatory Visit (INDEPENDENT_AMBULATORY_CARE_PROVIDER_SITE_OTHER): Payer: Managed Care, Other (non HMO) | Admitting: Internal Medicine

## 2013-08-22 ENCOUNTER — Encounter: Payer: Self-pay | Admitting: Internal Medicine

## 2013-08-22 VITALS — BP 100/62 | HR 80 | Ht 62.0 in | Wt 115.0 lb

## 2013-08-22 DIAGNOSIS — R633 Feeding difficulties, unspecified: Secondary | ICD-10-CM

## 2013-08-22 DIAGNOSIS — K9429 Other complications of gastrostomy: Secondary | ICD-10-CM

## 2013-08-22 DIAGNOSIS — K219 Gastro-esophageal reflux disease without esophagitis: Secondary | ICD-10-CM

## 2013-08-22 DIAGNOSIS — K9423 Gastrostomy malfunction: Secondary | ICD-10-CM

## 2013-08-22 MED ORDER — AMBULATORY NON FORMULARY MEDICATION: Status: DC

## 2013-08-22 NOTE — Progress Notes (Signed)
[name] [birth year] MRN [id number]   History of Present Illness:  This is a 27 year old white male with known cerebral palsy and gastroesophageal reflux disease. He is dependent on percutaneous gastrostomy tube feedings. Currently, he has a 19 [place] [name] replacement gastrostomy which was placed one month ago in Interventional radiology. He was recently seen in the emergency room after an episode of aspiration and coughing. The episode occurred when his parents were out of town and aids were with the patient. The mother reports an occasional cough with eating. He has never had aspiration pneumonia. A modified barium swallow showed severe oral phase dysphagia with incomplete closure of the glottis due to severe curvature of the cervical spine. He had silent penetration and aspiration of liquids but no penetration or aspiration with  soft food. The final recommendation was  dysphagia 1 diet consisting of pureed food, sitting upright and supervised feeding. His medications to be given via PEG. Continuous pump feedings were recommended but the insurance refused to pay for Kangaroo Pump and formula. Currently, his gastrostomy is functioning well. There is no leakage. The only time it leaks is when he coughs or does lot  lot or straining. He continues to eat soft foods.   Past Medical History  Diagnosis Date  . Cerebral palsy   . GERD (gastroesophageal reflux disease)   . Anxiety   . Depression   . Thyroid disease     hyper  . Incontinence of feces   . Palpitations   . Esophagitis    Past Surgical History  Procedure Laterality Date  . Spine surgery  ,11/20/2010, 2011  . Eye surgery    . Ears tubes    . Hamstring released    . Baclofen trial    . Baslofen pump implant    . Spinal fusion    . G-tube insert  August 2006  . Spinal fusioncorrect 106 degree kyphosis    . Spinal fusion to correct 70 degree kyphosis    . Tonsillectomy    . Peg placement  10/21/2011    Procedure:  PERCUTANEOUS ENDOSCOPIC GASTROSTOMY (PEG) REPLACEMENT;  Surgeon: Hart Carwin, MD;  Location: [facility];  Service: Endoscopy;  Laterality: N/A;  . Peg placement N/A 06/13/2013    Procedure: PERCUTANEOUS ENDOSCOPIC GASTROSTOMY (PEG) REPLACEMENT;  Surgeon: Hart Carwin, MD;  Location: [facility];  Service: Endoscopy;  Laterality: N/A;    reports that he has never smoked. He has never used smokeless tobacco. He reports that he does not drink alcohol or use illicit drugs. family history includes Asthma in his mother; Cancer in his maternal grandfather and maternal grandmother; Heart disease in his paternal grandfather; Hyperlipidemia in his mother. Allergies  Allergen Reactions  . Sulfonamide Derivatives Rash        Review of Systems: Normal bowel habits. Stable weight  The remainder of the 10 point ROS is negative except as outlined in H&P   Physical Exam: General appearance  Well developed, in no distress. Deformities of the upper and lower extremities due to cerebral palsy. Patient is nonverbal but is understanding of spoken speech. Eyes- non icteric. HEENT nontraumatic, normocephalic. Mouth no lesions, tongue papillated, no cheilosis. Drooling Neck supple without adenopathy, thyroid not enlarged, no carotid bruits, no JVD. Severe distortion of the cervical spine Lungs Clear to auscultation bilaterally. No wheezes or rales Cor normal S1, normal S2, regular rhythm, no murmur,  quiet precordium. Abdomen: Muscular soft with 24 French gastrostomy in left upper quadrant. Gastrostomy site appears clean. There  is no drainage. Rectal: Not done. Extremities no pedal edema. Deformities noted. Skin no lesions. Neurological alert and oriented x 3. Psychological normal mood and affect.  Assessment and Plan:  Problem #17 27 year old white male with a functioning percutaneous gastrostomy on bolus tube feedings.  Continuous feedings via a pump  Have been recommended but are not covered by his  insurance by his insurance. He will continue bolus tube feedings at a small volume of 4 ounces 2-4 times a day. He will continue on a soft dysphagia 1 diet as recommended by his speech pathologist. He is on an anti-reflux regimen which includes Carafate and Zegerid. [name] has learned how to replace his gastrostomy and we will provide a prescription for her which she can mail to the insurance company so she can receive a replacement gastrostomy in mail for $45. Pt  may in the future need a suction machine at home.   08/22/2013 [name]

## 2013-08-22 NOTE — Patient Instructions (Signed)
We have given you a prescription for the MIC gastrostomy to take to your medical supplier.  Cc: Dr Loreen Freud, Dr Ivory Broad, Dr Claud Kelp

## 2013-08-23 ENCOUNTER — Encounter
Payer: Managed Care, Other (non HMO) | Attending: Physical Medicine & Rehabilitation | Admitting: Physical Medicine & Rehabilitation

## 2013-08-23 ENCOUNTER — Encounter: Payer: Self-pay | Admitting: Physical Medicine & Rehabilitation

## 2013-08-23 VITALS — BP 101/54 | HR 80 | Resp 14 | Ht 62.0 in | Wt 115.0 lb

## 2013-08-23 DIAGNOSIS — G8250 Quadriplegia, unspecified: Secondary | ICD-10-CM | POA: Diagnosis not present

## 2013-08-23 DIAGNOSIS — G809 Cerebral palsy, unspecified: Secondary | ICD-10-CM | POA: Diagnosis not present

## 2013-08-23 DIAGNOSIS — G243 Spasmodic torticollis: Secondary | ICD-10-CM | POA: Insufficient documentation

## 2013-08-23 MED ORDER — TRAMADOL HCL 50 MG PO TABS: 25.0000 mg | ORAL_TABLET | Freq: Three times a day (TID) | ORAL | Status: DC | PRN

## 2013-08-23 NOTE — Progress Notes (Signed)
Botox Injection for spasticity using needle EMG guidance  Dilution: 100 Units/ml Indication: Severe spasticity which interferes with ADL,mobility and/or  hygiene and is unresponsive to medication management and other conservative care Informed consent was obtained after describing risks and benefits of the procedure with the patient. This includes bleeding, bruising, infection, excessive weakness, or medication side effects. A REMS form is on file and signed. Needle: 50mm injectable monopolar needle electrode Left trap: 3 access points, 100 units botox Left SCM: 2 access points 50 units botox Right trap 2 access points 50 units botox All injections were done after obtaining appropriate EMG activity and after negative drawback for blood. The patient tolerated the procedure well. Post procedure instructions were given. A followup appointment was made.   I will see him back in 2 months. An rx for tramadol was given to help with pain at night or severe breakthrough pain. He should use 25mg  only to start.

## 2013-08-23 NOTE — Patient Instructions (Signed)
CALL ME WITH ANY PROBLEMS OR QUESTIONS (#[phone]).  HAVE A GOOD DAY  IF THERE IS PAIN AT THE INJECTION SITES, TRY TYLENOL AND/OR ICE.  THIS SHOULD RESOLVE FAIRLY QUICKLY

## 2013-08-31 ENCOUNTER — Encounter (INDEPENDENT_AMBULATORY_CARE_PROVIDER_SITE_OTHER): Payer: Self-pay | Admitting: General Surgery

## 2013-08-31 ENCOUNTER — Ambulatory Visit (INDEPENDENT_AMBULATORY_CARE_PROVIDER_SITE_OTHER): Payer: Managed Care, Other (non HMO) | Admitting: General Surgery

## 2013-08-31 VITALS — BP 108/70 | HR 64 | Resp 16 | Ht 62.0 in | Wt 115.0 lb

## 2013-08-31 DIAGNOSIS — K9429 Other complications of gastrostomy: Secondary | ICD-10-CM

## 2013-08-31 DIAGNOSIS — Z931 Gastrostomy status: Secondary | ICD-10-CM

## 2013-08-31 DIAGNOSIS — K9420 Gastrostomy complication, unspecified: Secondary | ICD-10-CM

## 2013-08-31 HISTORY — DX: Gastrostomy status: Z93.1

## 2013-08-31 NOTE — Progress Notes (Signed)
Patient ID: [name], male   DOB: January 01, 1986, 27 y.o.   MRN: [id number]  Chief Complaint  Patient presents with  . New Evaluation    discuss G-Tube placement    HPI [name] is a 27 y.o. male.  He is brought by his mother, basically self-referred, for evaluation of gastrostomy tube site and leakage. His mother is a patient of mine, having undergone a laparoscopic ventral hernia repair. [name] has cerebral palsy. He is wheelchair dependent. He is awake and alert. Lots of spasticity. He is followed by Dr. Loreen Freud, Harold Hedge, and [name]. He initially had a gastrostomy tube placement years ago at [facility] This has been changed over the years. He currently has a 24 [place] [name] replacement gastrostomy, which was placed 2 months ago by interventional radiology. He is dependent on gastrostomy tube for hydration and meds because he cannot tolerate clear  liquids. He does tolerate puree diet very well. Earlier this month he was having increasing drainage around the tube especially after meals. The past week  everything has gotten better and there is very little drainage. The mother decided to bring him in any way for a consultation.  HPI  Past Medical History  Diagnosis Date  . Cerebral palsy   . GERD (gastroesophageal reflux disease)   . Anxiety   . Depression   . Thyroid disease     hyper  . Incontinence of feces   . Palpitations   . Esophagitis     Past Surgical History  Procedure Laterality Date  . Spine surgery  ,11/20/2010, 2011  . Eye surgery    . Ears tubes    . Hamstring released    . Baclofen trial    . Baslofen pump implant    . Spinal fusion    . G-tube insert  August 2006  . Spinal fusioncorrect 106 degree kyphosis    . Spinal fusion to correct 70 degree kyphosis    . Tonsillectomy    . Peg placement  10/21/2011    Procedure: PERCUTANEOUS ENDOSCOPIC GASTROSTOMY (PEG) REPLACEMENT;  Surgeon: Hart Carwin, MD;  Location: [facility];  Service:  Endoscopy;  Laterality: N/A;  . Peg placement N/A 06/13/2013    Procedure: PERCUTANEOUS ENDOSCOPIC GASTROSTOMY (PEG) REPLACEMENT;  Surgeon: Hart Carwin, MD;  Location: [facility];  Service: Endoscopy;  Laterality: N/A;    Family History  Problem Relation Age of Onset  . Asthma Mother   . Hyperlipidemia Mother   . Cancer Maternal Grandmother     breast  . Cancer Maternal Grandfather     prostate  . Heart disease Paternal Grandfather     Social History History  Substance Use Topics  . Smoking status: Never Smoker   . Smokeless tobacco: Never Used  . Alcohol Use: No    Allergies  Allergen Reactions  . Sulfonamide Derivatives Rash    Current Outpatient Prescriptions  Medication Sig Dispense Refill  . Acetylcysteine 600 MG CAPS Give 600 mg by tube 2 (two) times daily as needed (for headaches).       . AMBULATORY NON FORMULARY MEDICATION Medication Name: MIC gastrostomy/bolus feeding tube 24 French Part number 0110-24. #2 and 10 cc lurer lock syringe #2 Dx:  2 Device  1  . diazepam (VALIUM) 5 MG tablet Give 2.5-5 mg by tube 2 (two) times daily as needed for anxiety.       . divalproex (DEPAKOTE SPRINKLE) 125 MG capsule Give 250-500 mg by tube 2 (two) times  daily. Takes 250mg  in morning and takes 500mg  at bedtime      . Nutritional Supplements (FEEDING SUPPLEMENT, OSMOLITE 1.5 CAL,) LIQD Continuous Osmolite1.5cal at 45cc/hr    0  . OLANZapine (ZYPREXA) 5 MG tablet TAKE 1 TABLET BY MOUTH AT BEDTIME.  30 tablet  2  . Omeprazole-Sodium Bicarbonate (ZEGERID) 20-1100 MG CAPS capsule EMPTY CONTENTS OF 1 CAPSULE AND GIVE VIA PEG ONCE DAILY.  30 capsule  2  . PARoxetine (PAXIL) 30 MG tablet Give 30 mg by tube every morning.       . polyethylene glycol (MIRALAX) packet Give 17 g by tube every morning.       . Probiotic Product (ADVANCED PROBIOTIC 10) CAPS Give 1 capsule by tube daily.       . sucralfate (CARAFATE) 1 G tablet 1 g by PEG Tube route as needed.       . traMADol (ULTRAM) 50  MG tablet Take 0.5-1 tablets (25-50 mg total) by mouth every 8 (eight) hours as needed for pain.  45 tablet  0   No current facility-administered medications for this visit.    Review of Systems Review of Systems  Constitutional: Negative for fever, chills and unexpected weight change.  HENT: Negative for congestion, hearing loss, sore throat, trouble swallowing and voice change.   Eyes: Negative for visual disturbance.  Respiratory: Positive for cough. Negative for wheezing.   Cardiovascular: Negative for chest pain, palpitations and leg swelling.  Gastrointestinal: Positive for vomiting. Negative for nausea, abdominal pain, diarrhea, constipation, blood in stool, abdominal distention, anal bleeding and rectal pain.  Genitourinary: Negative for hematuria and difficulty urinating.  Musculoskeletal: Negative for arthralgias.  Skin: Negative for rash and wound.  Neurological: Negative for seizures, syncope, weakness and headaches.  Hematological: Negative for adenopathy. Does not bruise/bleed easily.  Psychiatric/Behavioral: Negative for confusion.    Blood pressure 108/70, pulse 64, resp. rate 16, height 5\' 2"  (1.575 m), weight 115 lb (52.164 kg).  Physical Exam Physical Exam  Constitutional: He is oriented to person, place, and time. No distress.  In a wheelchair. Upper extremity contractures and muscle atrophy. Some spasticity. Awake and alert.  HENT:  Nonverbal. Moans in reply to a question.  Eyes: Conjunctivae and EOM are normal. Pupils are equal, round, and reactive to light. Right eye exhibits no discharge. Left eye exhibits no discharge. No scleral icterus.  Neck: Normal range of motion. Neck supple. No JVD present. No tracheal deviation present. No thyromegaly present.  Cardiovascular: Normal rate, regular rhythm, normal heart sounds and intact distal pulses.   No murmur heard. Pulmonary/Chest: Effort normal and breath sounds normal. No stridor. No respiratory distress. He has  no wheezes. He has no rales.  Abdominal: Soft. Bowel sounds are normal. He exhibits no distension and no mass. There is no tenderness. There is no rebound and no guarding.  Gastrostomy tube left upper quadrant. The external plate is loose and I tightened this up by pushing it down the shaft of the tube about 5 cm, at which point there was a gentle snugness indicating the balloon was up against the gastric wall. I educated the mother about how to do this and to not do it too tightly for fair necrosis. The skin around the tube has faint erythema but this is very localized. The skin is actually healthy. The abdomen is benign.  Musculoskeletal: He exhibits no tenderness.  Contractures upper and lower extent of disease. Some muscle wasting.  Lymphadenopathy:    He has no cervical adenopathy.  Neurological: He is alert and oriented to person, place, and time. He has normal reflexes. Coordination normal.  Skin: Skin is warm and dry. No rash noted. He is not diaphoretic. No erythema. No pallor.    Data Reviewed Numerous office notes from numerous positions.  Assessment    Gastrostomy tube leakage and skin problems. They seem to be improving rapidly and it does not appear to be infected, and I do not think that further intervention is warranted.  Cerebral palsy  GERD  History spine surgery  History baclofen pump implant     Plan    There is no indication for surgical revision or replacement of his feeding gastrostomy tube.  Tube care discussed in detail with mother  Return to see me as needed.        Angelia Mould. Derrell Lolling, M.D., [facility] General and Minimally invasive Surgery Breast and Colorectal Surgery Office:   [phone] Pager:   [phone]  08/31/2013, 12:05 PM

## 2013-08-31 NOTE — Patient Instructions (Signed)
[name]'s current gastrostomy tube looks fine. The skin has minimal erythema and I do not think there is an infection.  Be sure to keep the tube pulled up gently against the inside of the stomach and reposition the external plate as necessary to get a good seal. Be sure not to pull this too tight.  Be sure that [name] sits up for a couple of hours after meals.  There is no indication for surgery or revision of the tube at this time.

## 2013-09-05 ENCOUNTER — Other Ambulatory Visit: Payer: Self-pay | Admitting: Family Medicine

## 2013-09-06 ENCOUNTER — Telehealth: Payer: Self-pay

## 2013-09-06 ENCOUNTER — Encounter: Payer: Self-pay | Admitting: Family Medicine

## 2013-09-06 MED ORDER — DIAZEPAM 5 MG PO TABS: 2.5000 mg | ORAL_TABLET | Freq: Two times a day (BID) | ORAL | Status: DC | PRN

## 2013-09-06 NOTE — Telephone Encounter (Signed)
Refill x1 with 5 refills  

## 2013-09-06 NOTE — Telephone Encounter (Signed)
Last seen 08/10/13 and filled 05/04/13 #60 with 3 refills. Please advise      KP

## 2013-09-07 NOTE — Telephone Encounter (Signed)
Last OV 08-10-13 Med last filled 06-05-13 #180 with 2 refills (inpatient)

## 2013-09-21 ENCOUNTER — Ambulatory Visit: Payer: Managed Care, Other (non HMO) | Admitting: Family Medicine

## 2013-09-25 ENCOUNTER — Ambulatory Visit: Payer: Managed Care, Other (non HMO) | Admitting: Family Medicine

## 2013-09-26 ENCOUNTER — Telehealth: Payer: Self-pay | Admitting: *Deleted

## 2013-09-26 NOTE — Telephone Encounter (Signed)
09/25/2013 Received letter to Dr. Laury Axon attached to Certificate of Medical Necessity for her to sign.  09/26/2013 Attached billing form, put in Sandra's folder.  bw

## 2013-09-27 IMAGING — XA IR REPLACE G-TUBE/COLONIC TUBE
1 series · 2 of 2 positions shown · non-contrast
Comparison: None

INDICATION: Gastric tube fallen out, replaced at home by the
patient's mother

FLUROSCOPIC GUIDED REPLACEMENT OF GASTROSTOMY TUBE

[Series 1: run · 2 of 2 slices shown]
[im 1/2]
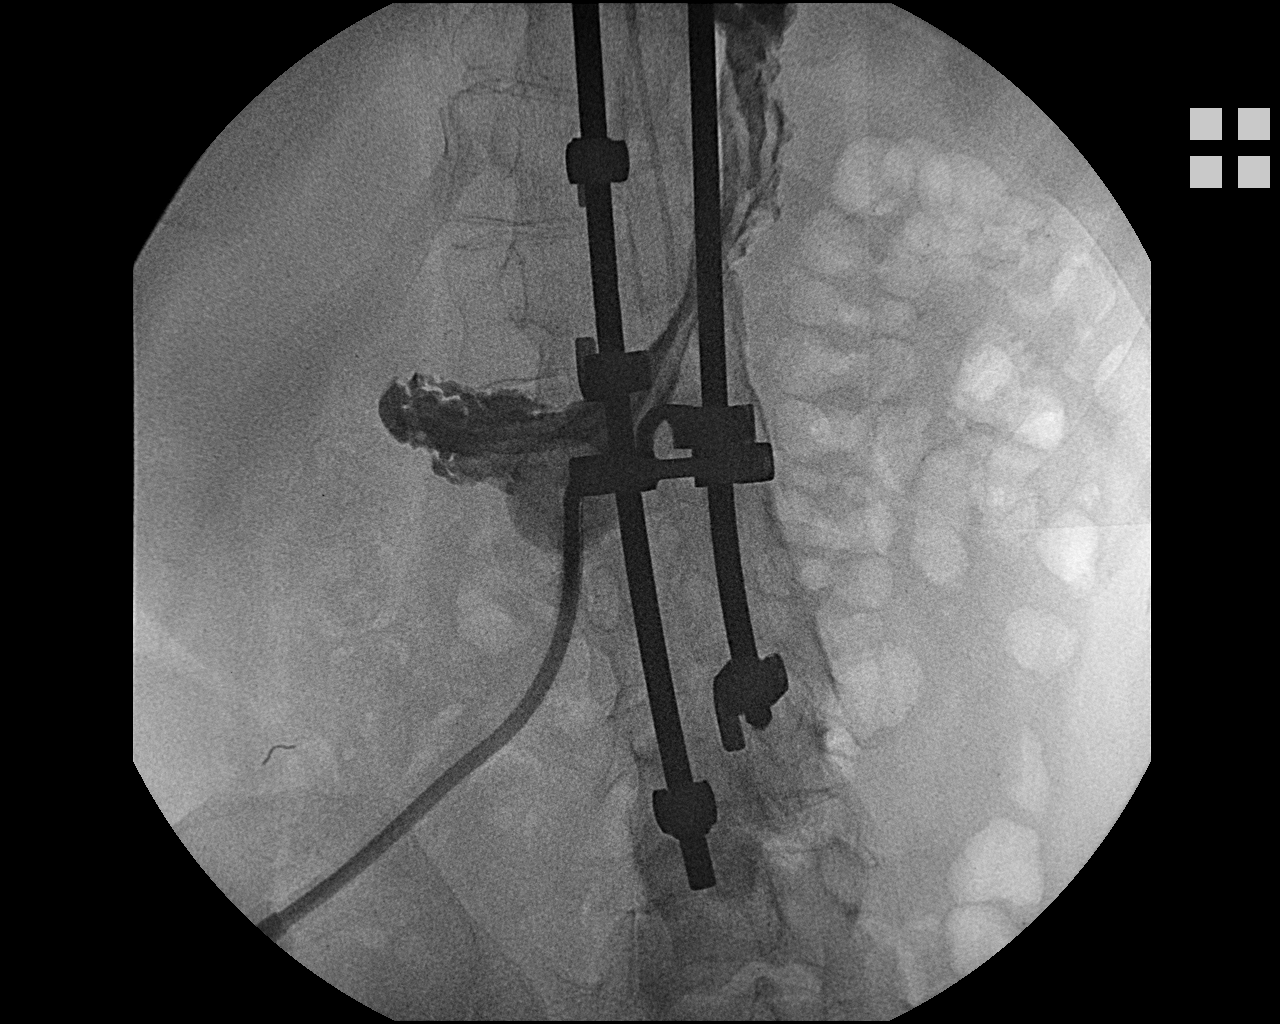
[im 2/2]
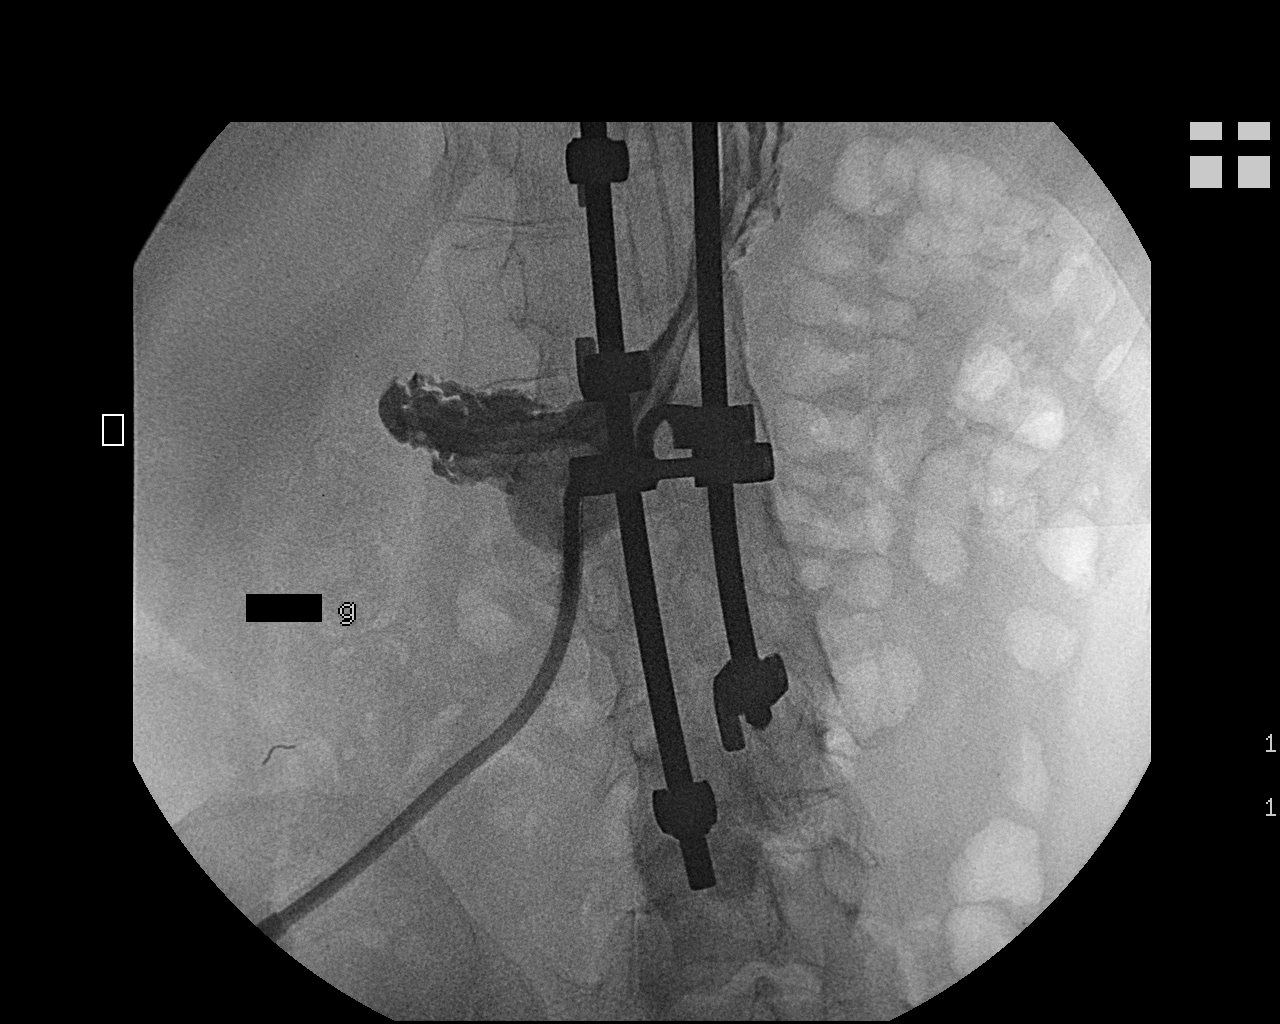

[2 of 2 positions shown; findings below may reference images not displayed]

Intravenous Medications: None

Contrast: 10 ml [id number], administered enterically

Fluoroscopy Time: 0.1 minutes.

Complications: None immediate

Technique / Findings:

Questions regarding the procedure were encouraged and answered.  A
timeout was performed prior to the initiation of the procedure.

The upper abdomen and external portion of the existing gastrostomy
tube was prepped and draped in the usual sterile fashion, and a
sterile drape was applied covering the operative field.  Maximum
barrier sterile technique with sterile gowns and gloves were used
for the procedure.  A timeout was performed prior to the initiation
of the procedure.

The existing gastrostomy tube balloon was deflated and removed
intact.  A new 20-Kofi Melendez balloon inflatable gastrostomy tube
was inserted through the gastrostomy tract.  The balloon was
inflated and disc was cinched.  Contrast was injected and a  spot
fluoroscopic image was obtained to confirm appropriate intraluminal
positioning.  A dressing was placed.  The patient tolerated the
procedure well without immediate postprocedural complication.
IMPRESSION: Successful fluoroscopic guided replacement of a new 20-French
gastrostomy tube.  The new gastrostomy tube is ready for immediate
use.

## 2013-09-27 NOTE — Telephone Encounter (Signed)
Letter signed by provider and mailed back to patients mother

## 2013-09-29 IMAGING — CR DG RIBS W/ CHEST 3+V BILAT
8 series · 8 of 8 positions shown · non-contrast
Comparison: Chest x-ray 06/06/2012.

CLINICAL DATA: Chest and bilateral rib pain.

BILATERAL RIBS AND CHEST - 4+ VIEW

[t chest supine]
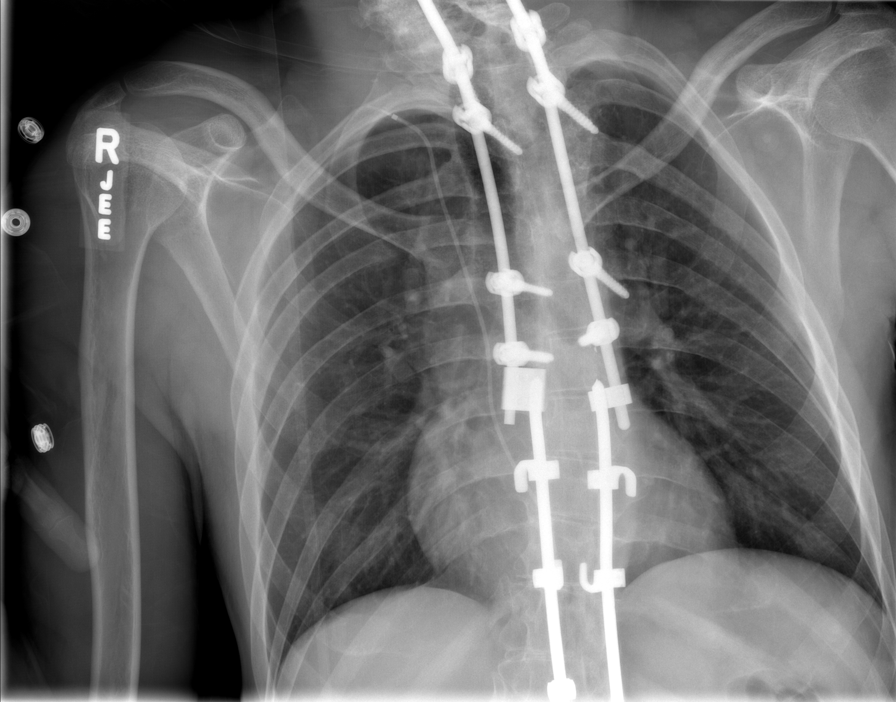

[t ribs ap upper right]
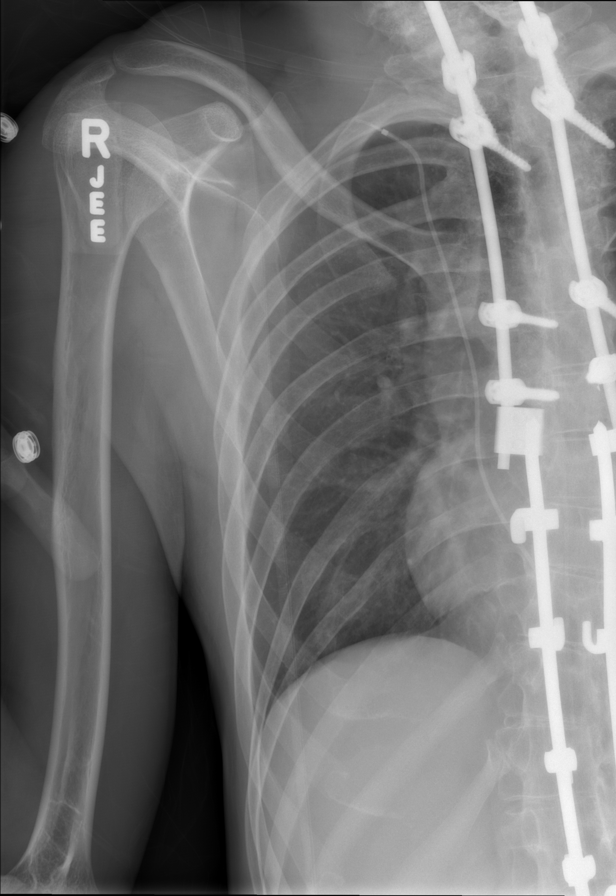

[t ribs ap lower right]
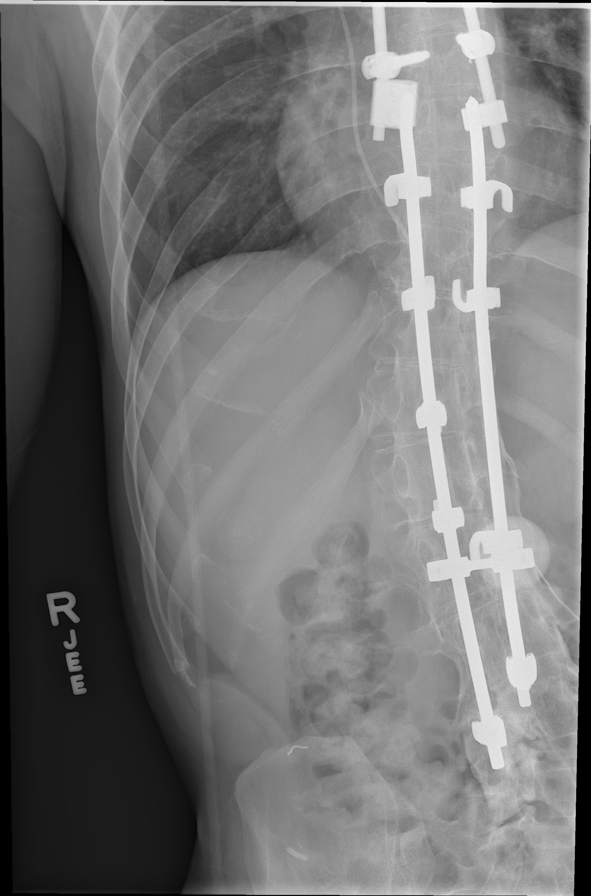

[t ribs rpo right (1 of 3)]
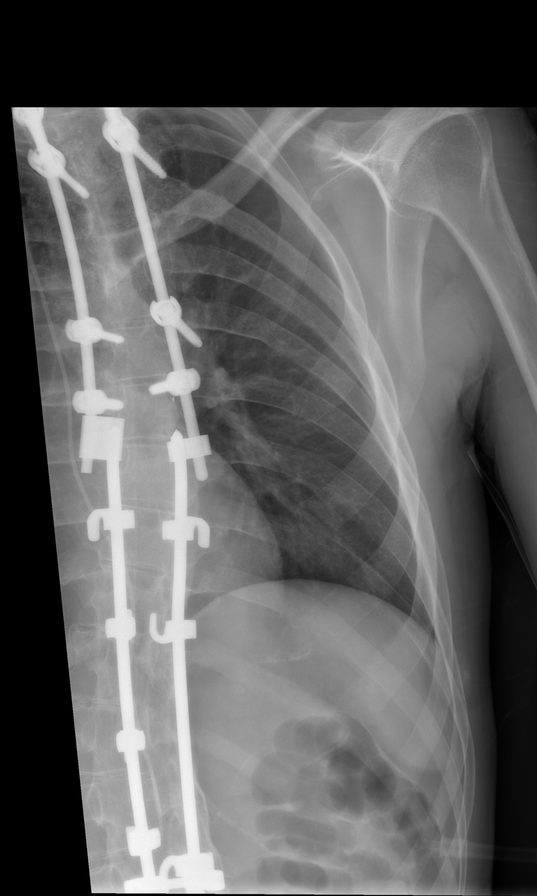

[t ribs ap lower left]
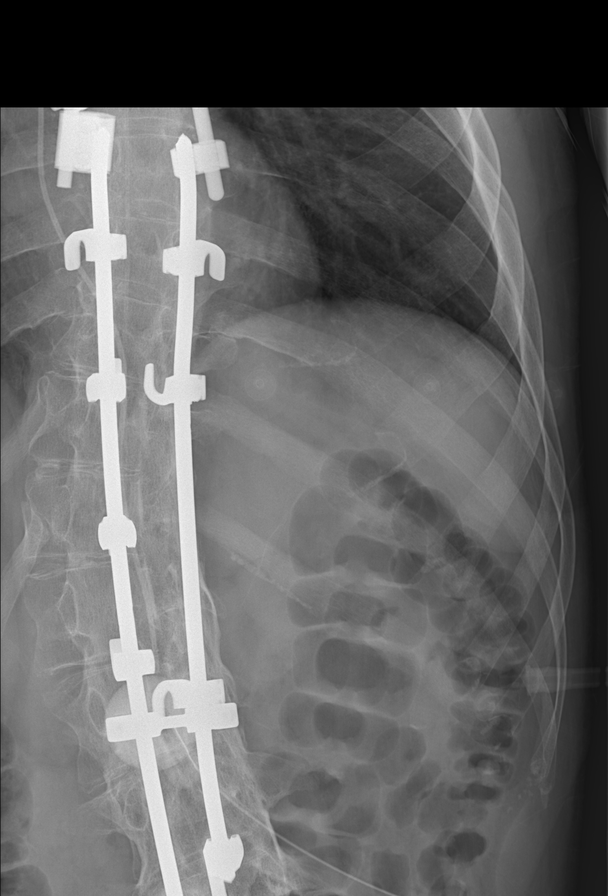

[t ribs rpo right (2 of 3)]
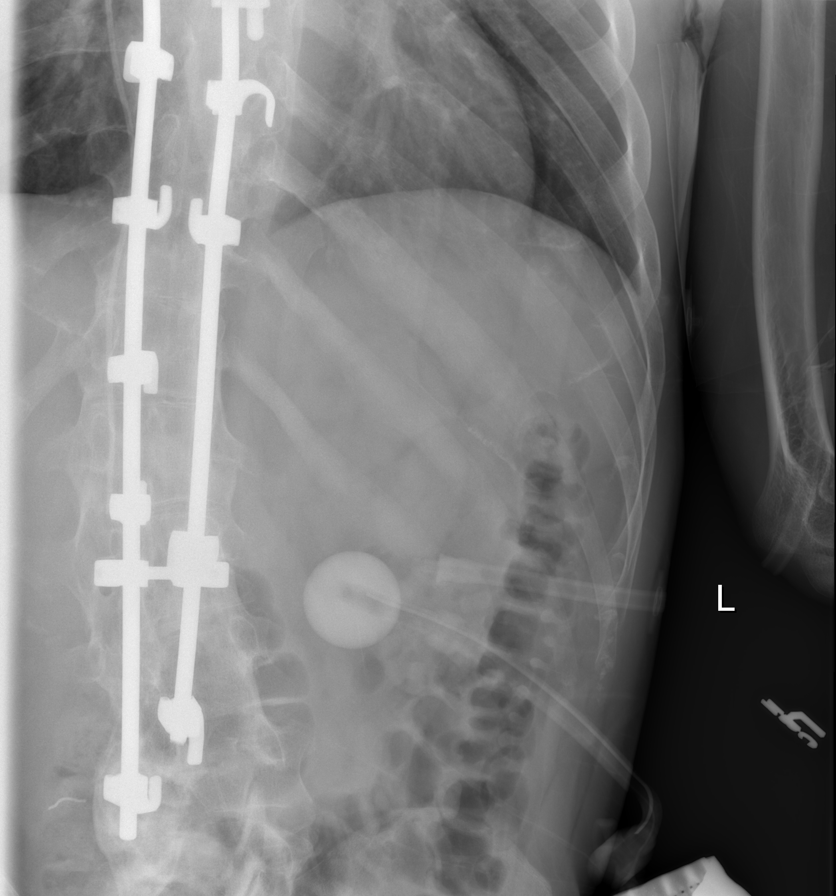

[t ribs lpo left]
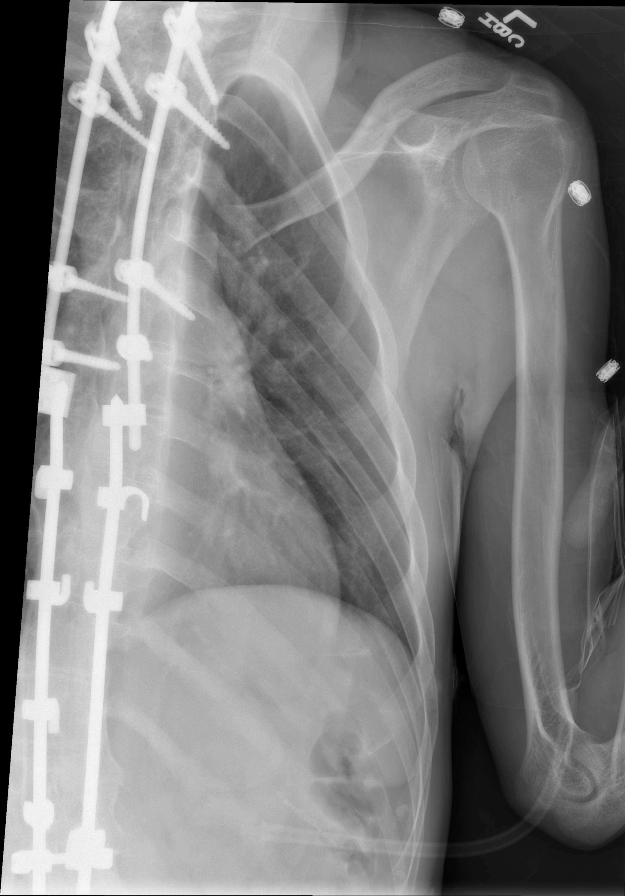

[t ribs rpo right (3 of 3)]
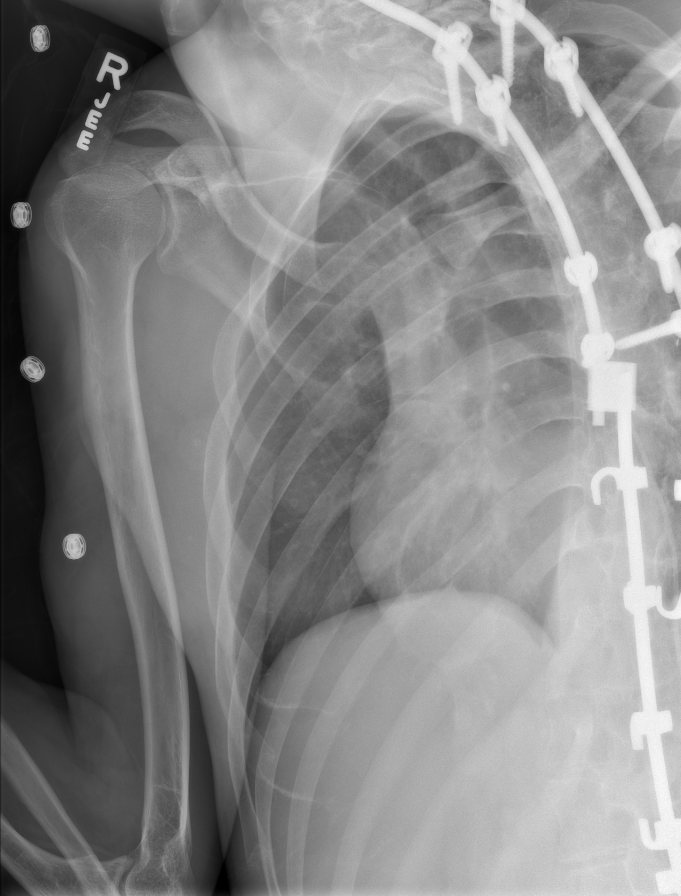

[8 of 8 positions shown; findings below may reference images not displayed]

FINDINGS: Stable spinal fusion hardware.  No complicating features.
Probable remote disconnected ventriculoperitoneal shunt catheter
noted. The cardiac silhouette, mediastinal and hilar contours are
normal.  The lungs are clear.  No pleural effusion or pneumothorax.

Dedicated views of both ribs demonstrate no definite acute rib
fractures.  No pleural thickening or pleural effusion.  A feeding
gastrostomy tube is noted.
IMPRESSION: No acute cardiopulmonary findings and no definite rib fractures.

## 2013-10-03 NOTE — Telephone Encounter (Signed)
10/03/2013  Sent copy of letter and Certificate of Medical Necessity to batch, no charge per [facility].  bw

## 2013-10-06 ENCOUNTER — Other Ambulatory Visit: Payer: Self-pay | Admitting: Family Medicine

## 2013-10-19 ENCOUNTER — Encounter: Payer: Self-pay | Admitting: Internal Medicine

## 2013-10-19 ENCOUNTER — Encounter: Payer: Self-pay | Admitting: Family Medicine

## 2013-10-20 ENCOUNTER — Telehealth: Payer: Self-pay | Admitting: *Deleted

## 2013-10-20 MED ORDER — OMEPRAZOLE 40 MG PO CPDR: 40.0000 mg | DELAYED_RELEASE_CAPSULE | Freq: Two times a day (BID) | ORAL | Status: DC

## 2013-10-20 NOTE — Telephone Encounter (Signed)
Rx sent 

## 2013-10-20 NOTE — Telephone Encounter (Signed)
I have completed a prior authorization for patient's omeprazole twice daily. Authorization request is being sent for review per [facility]. Omeprazole is typically only covered at once daily dosing. [name] advised.

## 2013-10-23 NOTE — Telephone Encounter (Signed)
Patient's omeprazole twice daily has been approved until 11/01/14. [name] has been advised.

## 2013-11-04 ENCOUNTER — Other Ambulatory Visit: Payer: Self-pay | Admitting: Family Medicine

## 2013-11-06 NOTE — Telephone Encounter (Signed)
Last seen 08/10/13  Depakote filled 09/05/13 #180 with 2 refills Zyprexa filled 08/07/13 #30 with 2 refills  Please advise      KP

## 2013-11-07 ENCOUNTER — Encounter: Payer: Managed Care, Other (non HMO) | Admitting: Physical Medicine & Rehabilitation

## 2013-11-14 ENCOUNTER — Telehealth: Payer: Self-pay | Admitting: *Deleted

## 2013-11-14 NOTE — Telephone Encounter (Signed)
How high is the fever?  If 100.4 or higher we can call in tamiflu again but there is a shortage if fever is not consistent she could wait.

## 2013-11-14 NOTE — Telephone Encounter (Signed)
Mother states that she will just continue using the tylenol cold and mucinex. Advised patient if any changes (fever, body aches, chills) to call the office. Mother understood. SW

## 2013-11-14 NOTE — Telephone Encounter (Signed)
Mother called and stated that she was giving Tamiflu as a precaution about 2 weeks ago. Mother states that patient has been having a fever on and off, running nose, chest congestion since Sunday. Mother states that patient feels like he doesn't need to be seen but she is concerned. Mother also states that she is not feeling that well enough yet to bring him in the office. Patient has been given tylenol cold OTC. Patient would like to now if she should just continue the tylenol or do he need a dose of Tamiflu also. Please advise. SW

## 2013-11-16 ENCOUNTER — Telehealth: Payer: Self-pay | Admitting: *Deleted

## 2013-11-16 NOTE — Telephone Encounter (Signed)
Patient was made an appointment

## 2013-11-16 NOTE — Telephone Encounter (Signed)
Mother called and stated that patient is still having some problems at night with a bad cough. Mother states that he is coughing up phlegm and is unable to spit it out. Mother would like to know how do she go about getting a suction machine for patient. Patient states that he is only getting about 3-4 hours a night of sleep and is having to sit up in his chair more than usual so he is having some swelling in his legs and feet. Mother would like to know if patient would benefit from a steroid injection or oral prednisone. Please advise. SW

## 2013-11-16 NOTE — Telephone Encounter (Signed)
He may-- we would need to see him

## 2013-11-17 ENCOUNTER — Emergency Department (HOSPITAL_BASED_OUTPATIENT_CLINIC_OR_DEPARTMENT_OTHER): Payer: Managed Care, Other (non HMO)

## 2013-11-17 ENCOUNTER — Emergency Department (HOSPITAL_BASED_OUTPATIENT_CLINIC_OR_DEPARTMENT_OTHER)
Admission: EM | Admit: 2013-11-17 | Discharge: 2013-11-17 | Disposition: A | Discharge status: Home / Self Care | Payer: Managed Care, Other (non HMO) | Attending: Emergency Medicine | Admitting: Emergency Medicine

## 2013-11-17 ENCOUNTER — Ambulatory Visit (INDEPENDENT_AMBULATORY_CARE_PROVIDER_SITE_OTHER): Payer: Managed Care, Other (non HMO) | Admitting: Family Medicine

## 2013-11-17 ENCOUNTER — Encounter: Payer: Self-pay | Admitting: Family Medicine

## 2013-11-17 ENCOUNTER — Encounter (HOSPITAL_BASED_OUTPATIENT_CLINIC_OR_DEPARTMENT_OTHER): Payer: Self-pay | Admitting: Emergency Medicine

## 2013-11-17 VITALS — BP 130/82 | HR 116 | Temp 99.1°F

## 2013-11-17 DIAGNOSIS — Z8679 Personal history of other diseases of the circulatory system: Secondary | ICD-10-CM | POA: Insufficient documentation

## 2013-11-17 DIAGNOSIS — F329 Major depressive disorder, single episode, unspecified: Secondary | ICD-10-CM | POA: Insufficient documentation

## 2013-11-17 DIAGNOSIS — Z8639 Personal history of other endocrine, nutritional and metabolic disease: Secondary | ICD-10-CM | POA: Insufficient documentation

## 2013-11-17 DIAGNOSIS — F3289 Other specified depressive episodes: Secondary | ICD-10-CM | POA: Diagnosis not present

## 2013-11-17 DIAGNOSIS — F411 Generalized anxiety disorder: Secondary | ICD-10-CM | POA: Diagnosis not present

## 2013-11-17 DIAGNOSIS — Z931 Gastrostomy status: Secondary | ICD-10-CM | POA: Insufficient documentation

## 2013-11-17 DIAGNOSIS — Z8669 Personal history of other diseases of the nervous system and sense organs: Secondary | ICD-10-CM | POA: Diagnosis not present

## 2013-11-17 DIAGNOSIS — R630 Anorexia: Secondary | ICD-10-CM | POA: Diagnosis not present

## 2013-11-17 DIAGNOSIS — J111 Influenza due to unidentified influenza virus with other respiratory manifestations: Secondary | ICD-10-CM | POA: Insufficient documentation

## 2013-11-17 DIAGNOSIS — Z862 Personal history of diseases of the blood and blood-forming organs and certain disorders involving the immune mechanism: Secondary | ICD-10-CM | POA: Diagnosis not present

## 2013-11-17 DIAGNOSIS — Z79899 Other long term (current) drug therapy: Secondary | ICD-10-CM | POA: Diagnosis not present

## 2013-11-17 DIAGNOSIS — K219 Gastro-esophageal reflux disease without esophagitis: Secondary | ICD-10-CM | POA: Insufficient documentation

## 2013-11-17 DIAGNOSIS — R509 Fever, unspecified: Secondary | ICD-10-CM

## 2013-11-17 LAB — CBC WITH DIFFERENTIAL/PLATELET
Basophils Absolute: 0 10*3/uL (ref 0.0–0.1)
Basophils Relative: 0 % (ref 0–1)
Eosinophils Absolute: 0.2 10*3/uL (ref 0.0–0.7)
Eosinophils Relative: 2 % (ref 0–5)
HCT: 40.1 % (ref 39.0–52.0)
Hemoglobin: 13.2 g/dL (ref 13.0–17.0)
Lymphocytes Relative: 32 % (ref 12–46)
Lymphs Abs: 2.4 10*3/uL (ref 0.7–4.0)
MCH: 29.1 pg (ref 26.0–34.0)
MCHC: 32.9 g/dL (ref 30.0–36.0)
MCV: 88.3 fL (ref 78.0–100.0)
Monocytes Absolute: 0.7 10*3/uL (ref 0.1–1.0)
Monocytes Relative: 9 % (ref 3–12)
Neutro Abs: 4.1 10*3/uL (ref 1.7–7.7)
Neutrophils Relative %: 56 % (ref 43–77)
Platelets: 286 10*3/uL (ref 150–400)
RBC: 4.54 MIL/uL (ref 4.22–5.81)
RDW: 13.2 % (ref 11.5–15.5)
WBC: 7.3 10*3/uL (ref 4.0–10.5)

## 2013-11-17 LAB — BASIC METABOLIC PANEL
BUN: 11 mg/dL (ref 6–23)
CO2: 28 mEq/L (ref 19–32)
Calcium: 9.4 mg/dL (ref 8.4–10.5)
Chloride: 99 mEq/L (ref 96–112)
Creatinine, Ser: 0.6 mg/dL (ref 0.50–1.35)
GFR calc Af Amer: >90 mL/min (ref >90)
GFR calc non Af Amer: >90 mL/min (ref >90)
Glucose, Bld: 87 mg/dL (ref 70–99)
Potassium: 3.8 mEq/L (ref 3.7–5.3)
Sodium: 142 mEq/L (ref 137–147)

## 2013-11-17 LAB — POCT URINALYSIS DIPSTICK
Bilirubin, UA: NEGATIVE
Blood, UA: NEGATIVE
Glucose, UA: NEGATIVE
Ketones, UA: NEGATIVE
Leukocytes, UA: NEGATIVE
Nitrite, UA: NEGATIVE
Protein, UA: NEGATIVE
Spec Grav, UA: 1.01
Urobilinogen, UA: 0.2
pH, UA: 7.5

## 2013-11-17 LAB — POCT INFLUENZA A/B
Influenza A, POC: NEGATIVE
Influenza B, POC: NEGATIVE

## 2013-11-17 MED ORDER — OSELTAMIVIR PHOSPHATE 75 MG PO CAPS
ORAL_CAPSULE | ORAL | Status: AC
  Filled 2013-11-17: qty 1

## 2013-11-17 MED ORDER — SODIUM CHLORIDE 0.9 % IV BOLUS (SEPSIS)
500.0000 mL | Freq: Once | INTRAVENOUS | Status: AC
  Administered 2013-11-17: 500 mL via INTRAVENOUS

## 2013-11-17 MED ORDER — OSELTAMIVIR NICU ORAL SYRINGE 6 MG/ML: ORAL | Status: DC

## 2013-11-17 MED ORDER — OSELTAMIVIR PHOSPHATE 75 MG PO CAPS
ORAL_CAPSULE | ORAL | Status: AC
  Administered 2013-11-17: 21:00:00
  Filled 2013-11-17: qty 1

## 2013-11-17 MED ORDER — SODIUM CHLORIDE 0.9 % IV SOLN
Freq: Once | INTRAVENOUS | Status: AC
  Administered 2013-11-17: 500 mL via INTRAVENOUS

## 2013-11-17 MED ORDER — HYDROCODONE-HOMATROPINE 5-1.5 MG/5ML PO SYRP: 5.0000 mL | ORAL_SOLUTION | Freq: Four times a day (QID) | ORAL | Status: DC | PRN

## 2013-11-17 NOTE — Progress Notes (Signed)
Pre visit review using our clinic review tool, if applicable. No additional management support is needed unless otherwise documented below in the visit note. 

## 2013-11-17 NOTE — ED Notes (Signed)
Fever, cough, no appetite, no BM and decreased urine output x 5 days.

## 2013-11-17 NOTE — ED Notes (Signed)
tamiflu cap dissolved in water and given via J tube, pt belched and med spouted out.  Another was prepared and given

## 2013-11-17 NOTE — Progress Notes (Signed)
  Subjective:     [name] is a 28 y.o. male here for evaluation of a cough. Onset of symptoms was several days ago. Symptoms have been rapidly worsening since that time. The cough is productive and is aggravated by  any activity. Associated symptoms include: chills, fever, night sweats, postnasal drip, shortness of breath and sputum production. Patient does not have a history of asthma. Patient does not have a history of environmental allergens. Patient has not traveled recently. Patient does not have a history of smoking. Patient has not had a previous chest x-ray. Patient has not had a PPD done.  + hx CP Pt is not drinking   The following portions of the patient's history were reviewed and updated as appropriate: allergies, current medications, past family history, past medical history, past social history, past surgical history and problem list.  Review of Systems Pertinent items are noted in HPI.    Objective:    Oxygen saturation 97% on room air BP 130/82  Pulse 116  Temp(Src) 99.1 F (37.3 C) (Oral)  SpO2 97% General appearance: alert, cooperative and mild distress Lungs: rales bilaterally and rhonchi bilaterally    Assessment:    Cough --- ? Pneumonia/flu   Plan:    sent to ER secondary to dehydration and possible pneumonia with hx CP

## 2013-11-17 NOTE — Discharge Instructions (Signed)

## 2013-11-17 NOTE — ED Provider Notes (Signed)
CSN: [id number]     Arrival date & time 11/17/13  1718 History   First MD Initiated Contact with Patient 11/17/13 1738     Chief Complaint  Patient presents with  . Influenza    HPI Patient has had 4-5 day history of fever, cough, decreased appetite.  Urine has been darker than normal.  Went to his primary care Dr. who felt like you need a chest x-ray.  Both mother and father had influenza earlier in the month. Past Medical History  Diagnosis Date  . Cerebral palsy   . GERD (gastroesophageal reflux disease)   . Anxiety   . Depression   . Thyroid disease     hyper  . Incontinence of feces   . Palpitations   . Esophagitis    Past Surgical History  Procedure Laterality Date  . Spine surgery  ,11/20/2010, 2011  . Eye surgery    . Ears tubes    . Hamstring released    . Baclofen trial    . Baslofen pump implant    . Spinal fusion    . G-tube insert  August 2006  . Spinal fusioncorrect 106 degree kyphosis    . Spinal fusion to correct 70 degree kyphosis    . Tonsillectomy    . Peg placement  10/21/2011    Procedure: PERCUTANEOUS ENDOSCOPIC GASTROSTOMY (PEG) REPLACEMENT;  Surgeon: [name], MD;  Location: [facility];  Service: Endoscopy;  Laterality: N/A;  . Peg placement N/A 06/13/2013    Procedure: PERCUTANEOUS ENDOSCOPIC GASTROSTOMY (PEG) REPLACEMENT;  Surgeon: [name], MD;  Location: [facility];  Service: Endoscopy;  Laterality: N/A;   Family History  Problem Relation Age of Onset  . Asthma Mother   . Hyperlipidemia Mother   . Cancer Maternal Grandmother     breast  . Cancer Maternal Grandfather     prostate  . Heart disease Paternal Grandfather    History  Substance Use Topics  . Smoking status: Never Smoker   . Smokeless tobacco: Never Used  . Alcohol Use: No    Review of Systems  Unable to perform ROS: Other    Allergies  Sulfonamide derivatives  Home Medications   Current Outpatient Rx  Name  Route  Sig  Dispense  Refill  . AMBULATORY  NON FORMULARY MEDICATION      Medication Name: MIC gastrostomy/bolus feeding tube 24 French Part number 0110-24. #2 and 10 cc lurer lock syringe #2 Dx:   2 Device   1     Dx: 783.3 abd 343.9   . DEPAKOTE SPRINKLES 125 MG capsule      TAKE 2 CAPSULES EVERY MORNING AND 4 CAPSULES EVERY EVENING VIA G TUBE.   180 capsule   0     Dispense as written.   . diazepam (VALIUM) 5 MG tablet   Oral   Take 0.5-1 tablets (2.5-5 mg total) by mouth 2 (two) times daily as needed for anxiety.   30 tablet   5   . HYDROcodone-homatropine (HYCODAN) 5-1.5 MG/5ML syrup   Oral   Take 5 mLs by mouth every 6 (six) hours as needed for cough.   120 mL   0   . OLANZapine (ZYPREXA) 5 MG tablet      TAKE 1 TABLET BY MOUTH AT BEDTIME.   30 tablet   2   . omeprazole (PRILOSEC) 40 MG capsule   Oral   Take 1 capsule (40 mg total) by mouth 2 (two) times daily.  60 capsule   2   . oseltamivir (TAMIFLU) 6 mg/mL SUSP      12.5 cc to be given through gastric tube twice daily for 5 days   150 mL   0   . PARoxetine (PAXIL) 30 MG tablet      TAKE 1 TABLET BY MOUTH DAILY.   30 tablet   2   . polyethylene glycol (MIRALAX) packet   Tube   Give 17 g by tube every morning.          . Probiotic Product (ADVANCED PROBIOTIC 10) CAPS   Tube   Give 1 capsule by tube daily.          . sucralfate (CARAFATE) 1 G tablet   PEG Tube   1 g by PEG Tube route as needed.           BP 129/81  Pulse 106  Temp(Src) 100.6 F (38.1 C) (Rectal)  Resp 20  Wt 115 lb (52.164 kg)  SpO2 96% Physical Exam  Nursing note and vitals reviewed. Constitutional: He appears well-developed and well-nourished. No distress.  HENT:  Head: Normocephalic and atraumatic.  Eyes: Pupils are equal, round, and reactive to light.  Neck: Normal range of motion.  Cardiovascular: Normal rate and intact distal pulses.   Pulmonary/Chest: No respiratory distress. He has rhonchi (Scattered).  Abdominal: Normal appearance. He  exhibits no distension.  Musculoskeletal: Normal range of motion.  Neurological: He is alert.  Patient has physical findings consistent with cerebral palsy.  Skin: Skin is warm and dry. No rash noted.  Psychiatric: He has a normal mood and affect. His behavior is normal.    ED Course  Procedures (including critical care time) Labs Review Labs Reviewed  BASIC METABOLIC PANEL  CBC WITH DIFFERENTIAL  INFLUENZA PANEL BY PCR (TYPE A & B, H1N1)    Medications  sodium chloride 0.9 % bolus 500 mL (0 mLs Intravenous Stopped 11/17/13 2001)  0.9 %  sodium chloride infusion (500 mLs Intravenous New Bag/Given 11/17/13 2027)    Imaging Review Dg Chest 2 View  IMPRESSION:  1. No radiographic evidence of acute cardiopulmonary disease. The appearance of the chest is similar to prior studies, as above.    Electronically Signed   By: Vinnie Langton M.D.   On: 11/17/2013 19:16    EKG Interpretation   None       MDM   1. Influenza        Dot Lanes, MD 11/17/13 2042

## 2013-11-18 LAB — INFLUENZA PANEL BY PCR (TYPE A & B)
H1N1 flu by pcr: NOT DETECTED
Influenza A By PCR: NEGATIVE
Influenza B By PCR: NEGATIVE

## 2013-11-20 ENCOUNTER — Ambulatory Visit (INDEPENDENT_AMBULATORY_CARE_PROVIDER_SITE_OTHER): Payer: Managed Care, Other (non HMO) | Admitting: Pulmonary Disease

## 2013-11-20 ENCOUNTER — Encounter: Payer: Self-pay | Admitting: Pulmonary Disease

## 2013-11-20 ENCOUNTER — Telehealth: Payer: Self-pay | Admitting: Internal Medicine

## 2013-11-20 VITALS — BP 104/64 | HR 98 | Temp 97.3°F | Wt 115.0 lb

## 2013-11-20 DIAGNOSIS — J690 Pneumonitis due to inhalation of food and vomit: Secondary | ICD-10-CM | POA: Diagnosis not present

## 2013-11-20 DIAGNOSIS — R1310 Dysphagia, unspecified: Secondary | ICD-10-CM

## 2013-11-20 MED ORDER — LEVOFLOXACIN 25 MG/ML PO SOLN: 500.0000 mg | Freq: Every day | ORAL | Status: DC

## 2013-11-20 MED ORDER — PREDNISONE 5 MG/5ML PO SOLN: ORAL | Status: DC

## 2013-11-20 NOTE — Progress Notes (Signed)
Subjective:    Patient ID: [name], male    DOB: September 11, 1986, 28 y.o.   MRN: [id number]  HPI  Chief Complaint  Patient presents with  . Acute Visit    MR pt, has not been seen in 4 years. Pt's mother reports coughing, chest congestion, wheezing. Has not been able to sleep because of cough.   28year old male.- accompanied by mom Kern Reap . He has cerebral palsy and needs assistance for all activities of daily living, baseline wheelchair-bound. Underwent 8h T9-C6 spinal fusion at [facility] in Sept 2011. He had a PEG placed in 2012 for hydration. He was hospitalized in 08/2013 for aspiration pneumonia,Swallow evaluation showed severe dysphagia with high risk of aspirating. He has 4 caregivers at home. Apparently there have been some issues with getting him a feeding pump at home, and he is been maintained on a dysphagia 3 diet.  Mom and dad got sick in first week of January and he has been coughing since 11/12/2012. He's been unable to sleep due to coughing was febrile 4 days ago. He had a visit on 1/16 showed normal labs and chest x-ray without infiltrates or effusions. Rapid flu test was negative. He is unable to expectorate, in the past section arthritis has helped him. Supplier is [employer]  Past Medical History  Diagnosis Date  . Cerebral palsy   . GERD (gastroesophageal reflux disease)   . Anxiety   . Depression   . Thyroid disease     hyper  . Incontinence of feces   . Palpitations   . Esophagitis     Past Surgical History  Procedure Laterality Date  . Spine surgery  ,11/20/2010, 2011  . Eye surgery    . Ears tubes    . Hamstring released    . Baclofen trial    . Baslofen pump implant    . Spinal fusion    . G-tube insert  August 2006  . Spinal fusioncorrect 106 degree kyphosis    . Spinal fusion to correct 70 degree kyphosis    . Tonsillectomy    . Peg placement  10/21/2011    Procedure: PERCUTANEOUS ENDOSCOPIC GASTROSTOMY (PEG) REPLACEMENT;   Surgeon: [name], MD;  Location: [facility];  Service: Endoscopy;  Laterality: N/A;  . Peg placement N/A 06/13/2013    Procedure: PERCUTANEOUS ENDOSCOPIC GASTROSTOMY (PEG) REPLACEMENT;  Surgeon: [name], MD;  Location: [facility];  Service: Endoscopy;  Laterality: N/A;    Allergies  Allergen Reactions  . Sulfonamide Derivatives Rash    History   Social History  . Marital Status: Single    Spouse Name: N/A    Number of Children: 0  . Years of Education: N/A   Occupational History  . disbaled    Social History Main Topics  . Smoking status: Never Smoker   . Smokeless tobacco: Never Used  . Alcohol Use: No  . Drug Use: No  . Sexual Activity: Not on file   Other Topics Concern  . Not on file   Social History Narrative  . No narrative on file    Family History  Problem Relation Age of Onset  . Asthma Mother   . Hyperlipidemia Mother   . Cancer Maternal Grandmother     breast  . Cancer Maternal Grandfather     prostate  . Heart disease Paternal Grandfather          Review of Systems Constitutional: negative for anorexia, fevers and sweats  Eyes: negative for irritation, redness and visual disturbance  Ears, nose, mouth, throat, and face: negative for earaches, epistaxis, nasal congestion and sore throat  Respiratory: negative for  dyspnea on exertion, sputum and wheezing  Cardiovascular: negative for chest pain, dyspnea, lower extremity edema, orthopnea, palpitations and syncope  Gastrointestinal: negative for abdominal pain, constipation, diarrhea, melena, nausea and vomiting  Genitourinary:negative for dysuria, frequency and hematuria  Hematologic/lymphatic: negative for bleeding, easy bruising and lymphadenopathy  Musculoskeletal:negative for arthralgias, muscle weakness and stiff joints  Neurological: negative for coordination problems, gait problems, headaches and weakness  Endocrine: negative for diabetic symptoms including polydipsia,  polyuria and weight loss     Objective:   Physical Exam  Gen. Pleasant,  in mild distress due to coughing, normal affect, in wheelchair ENT - no lesions, no post nasal drip, class 2-3 airway Neck: No JVD, no thyromegaly, no carotid bruits Lungs: no use of accessory muscles, no dullness to percussion, decreased without rales or rhonchi  Cardiovascular: Rhythm regular, heart sounds  normal, no murmurs or gallops, no peripheral edema Abdomen: soft and non-tender, no hepatosplenomegaly, BS normal. Musculoskeletal: No deformities, no cyanosis or clubbing Neuro:  alert, non focal, no tremors       Assessment & Plan:

## 2013-11-20 NOTE — Patient Instructions (Signed)
Levaquin 500 daily x 7 days Prednisone 10 mg tabs  Take 2 tabs daily with food x 5ds, then 1 tab daily with food x 5ds then STOP Ok for cough syrup q 6h as needed Suction machine Rx will be sent to DME- [facility] STOP oral feeding x 1 week, stay with tube feeds Call if no better

## 2013-11-20 NOTE — Telephone Encounter (Signed)
Pt has been scheduled today with RA at 2:15pm. I got the okay to double book appointment from [place].

## 2013-11-20 NOTE — Assessment & Plan Note (Signed)
While his current symptoms could be related to acute bronchitis, note a recent viral syndrome and mom and dad, but this could also be related to recurrent aspiration. Levaquin 500 daily x 7 days Prednisone 10 mg tabs  Take 2 tabs daily with food x 5ds, then 1 tab daily with food x 5ds then STOP Ok for cough syrup q 6h as needed for symptomatic relief Suction machine Rx will be sent to DME- [facility] -due to recurrent aspiration STOP oral feeding x 1 week, stay with tube feeds - for dysphagia and malnutrition Call if no better

## 2013-11-21 ENCOUNTER — Telehealth: Payer: Self-pay | Admitting: Pulmonary Disease

## 2013-11-21 NOTE — Telephone Encounter (Signed)
Patient Instructions     Levaquin 500 daily x 7 days  Prednisone 10 mg tabs Take 2 tabs daily with food x 5ds, then 1 tab daily with food x 5ds then STOP  Ok for cough syrup q 6h as needed  Suction machine Rx will be sent to DME- [facility]  STOP oral feeding x 1 week, stay with tube feeds  Call if no better    Spoke with patients mother-she states that the orders have been taken care of and they are getting the supplies. However, she then told me that she is to the point of taking the patient to the ER as he is not able to eat as when she opens the g tube fluid comes out. She stated that a caregiver/nurse came into the home today and heard gurgles in the patients chest-they are wondering if the fluids in patients stomach is backing up into his chest/lungs and/or esophagus.The mother feels the patient is in distress again.  He is not urinating well and when he is able to the urine has a strong odor. She has also been sick and unable to get well for making sure her son is taken care of.   Pt has appointment with new PCP tomorrow but mother is concerned he can not wait until then.   I paged RA regarding the patient. Will await a call back to advise on.

## 2013-11-21 NOTE — Telephone Encounter (Signed)
[name] with [name] states that the orders just need to be sent to [facility] fax # [phone] (this fax @ is different than the one previously left) & only to call her if we have questions regarding these orders.  Also, ok to call pt's mother, [name] at [phone] with questions regarding the orders.  Satira Anis

## 2013-11-21 NOTE — Telephone Encounter (Signed)
Per Dr. Elsworth Soho. Take pt to the ED for eval.  i called and made mother aware. Nothing further needed

## 2013-11-21 NOTE — Telephone Encounter (Signed)
lmomtcb x1 [name]. We need to speak with her regarding the orders. Called pt mother and LMTCB x1 as well to see about the orders

## 2013-11-21 NOTE — Telephone Encounter (Signed)
lmomtcb x1 for [employer]

## 2013-11-22 ENCOUNTER — Encounter: Payer: Self-pay | Admitting: Family Medicine

## 2013-11-22 ENCOUNTER — Inpatient Hospital Stay (HOSPITAL_BASED_OUTPATIENT_CLINIC_OR_DEPARTMENT_OTHER)
Admission: EM | Admit: 2013-11-22 | Discharge: 2013-11-27 | DRG: 640 | Disposition: A | Discharge status: Home Health | Payer: Managed Care, Other (non HMO) | Attending: Family Medicine | Admitting: Family Medicine

## 2013-11-22 ENCOUNTER — Ambulatory Visit (INDEPENDENT_AMBULATORY_CARE_PROVIDER_SITE_OTHER): Payer: Managed Care, Other (non HMO) | Admitting: Family Medicine

## 2013-11-22 ENCOUNTER — Emergency Department (HOSPITAL_BASED_OUTPATIENT_CLINIC_OR_DEPARTMENT_OTHER): Payer: Managed Care, Other (non HMO)

## 2013-11-22 ENCOUNTER — Encounter (HOSPITAL_BASED_OUTPATIENT_CLINIC_OR_DEPARTMENT_OTHER): Payer: Self-pay | Admitting: Emergency Medicine

## 2013-11-22 ENCOUNTER — Inpatient Hospital Stay (HOSPITAL_BASED_OUTPATIENT_CLINIC_OR_DEPARTMENT_OTHER): Payer: Managed Care, Other (non HMO)

## 2013-11-22 ENCOUNTER — Telehealth: Payer: Self-pay | Admitting: Pulmonary Disease

## 2013-11-22 VITALS — BP 110/82 | HR 103 | Temp 98.2°F | Ht 62.0 in

## 2013-11-22 DIAGNOSIS — E860 Dehydration: Principal | ICD-10-CM

## 2013-11-22 DIAGNOSIS — G809 Cerebral palsy, unspecified: Secondary | ICD-10-CM | POA: Diagnosis not present

## 2013-11-22 DIAGNOSIS — R935 Abnormal findings on diagnostic imaging of other abdominal regions, including retroperitoneum: Secondary | ICD-10-CM | POA: Diagnosis not present

## 2013-11-22 DIAGNOSIS — Z981 Arthrodesis status: Secondary | ICD-10-CM

## 2013-11-22 DIAGNOSIS — Z79899 Other long term (current) drug therapy: Secondary | ICD-10-CM

## 2013-11-22 DIAGNOSIS — F329 Major depressive disorder, single episode, unspecified: Secondary | ICD-10-CM | POA: Diagnosis present

## 2013-11-22 DIAGNOSIS — J690 Pneumonitis due to inhalation of food and vomit: Secondary | ICD-10-CM | POA: Diagnosis present

## 2013-11-22 DIAGNOSIS — F3289 Other specified depressive episodes: Secondary | ICD-10-CM | POA: Diagnosis present

## 2013-11-22 DIAGNOSIS — Z931 Gastrostomy status: Secondary | ICD-10-CM | POA: Diagnosis not present

## 2013-11-22 DIAGNOSIS — N1370 Vesicoureteral-reflux, unspecified: Secondary | ICD-10-CM | POA: Diagnosis present

## 2013-11-22 DIAGNOSIS — R1310 Dysphagia, unspecified: Secondary | ICD-10-CM

## 2013-11-22 DIAGNOSIS — G808 Other cerebral palsy: Secondary | ICD-10-CM | POA: Diagnosis not present

## 2013-11-22 DIAGNOSIS — F411 Generalized anxiety disorder: Secondary | ICD-10-CM

## 2013-11-22 DIAGNOSIS — E43 Unspecified severe protein-calorie malnutrition: Secondary | ICD-10-CM | POA: Diagnosis present

## 2013-11-22 DIAGNOSIS — K219 Gastro-esophageal reflux disease without esophagitis: Secondary | ICD-10-CM | POA: Diagnosis present

## 2013-11-22 DIAGNOSIS — IMO0002 Reserved for concepts with insufficient information to code with codable children: Secondary | ICD-10-CM

## 2013-11-22 DIAGNOSIS — R638 Other symptoms and signs concerning food and fluid intake: Secondary | ICD-10-CM | POA: Diagnosis not present

## 2013-11-22 DIAGNOSIS — E0590 Thyrotoxicosis, unspecified without thyrotoxic crisis or storm: Secondary | ICD-10-CM | POA: Diagnosis present

## 2013-11-22 DIAGNOSIS — R1312 Dysphagia, oropharyngeal phase: Secondary | ICD-10-CM | POA: Diagnosis not present

## 2013-11-22 DIAGNOSIS — Z4682 Encounter for fitting and adjustment of non-vascular catheter: Secondary | ICD-10-CM | POA: Diagnosis not present

## 2013-11-22 HISTORY — DX: Dehydration: E86.0

## 2013-11-22 LAB — COMPREHENSIVE METABOLIC PANEL
ALT: 19 U/L (ref 0–53)
AST: 25 U/L (ref 0–37)
Albumin: 3.9 g/dL (ref 3.5–5.2)
Alkaline Phosphatase: 65 U/L (ref 39–117)
BUN: 6 mg/dL (ref 6–23)
CO2: 28 mEq/L (ref 19–32)
Calcium: 9.2 mg/dL (ref 8.4–10.5)
Chloride: 100 mEq/L (ref 96–112)
Creatinine, Ser: 0.5 mg/dL (ref 0.50–1.35)
GFR calc Af Amer: >90 mL/min (ref >90)
GFR calc non Af Amer: >90 mL/min (ref >90)
Glucose, Bld: 94 mg/dL (ref 70–99)
Potassium: 3.8 mEq/L (ref 3.7–5.3)
Sodium: 142 mEq/L (ref 137–147)
Total Bilirubin: 0.3 mg/dL (ref 0.3–1.2)
Total Protein: 7.4 g/dL (ref 6.0–8.3)

## 2013-11-22 LAB — CG4 I-STAT (LACTIC ACID): Lactic Acid, Venous: 0.71 mmol/L (ref 0.5–2.2)

## 2013-11-22 LAB — CBC
HCT: 41.8 % (ref 39.0–52.0)
Hemoglobin: 13.7 g/dL (ref 13.0–17.0)
MCH: 29.3 pg (ref 26.0–34.0)
MCHC: 32.8 g/dL (ref 30.0–36.0)
MCV: 89.3 fL (ref 78.0–100.0)
Platelets: 363 10*3/uL (ref 150–400)
RBC: 4.68 MIL/uL (ref 4.22–5.81)
RDW: 14.2 % (ref 11.5–15.5)
WBC: 6.4 10*3/uL (ref 4.0–10.5)

## 2013-11-22 LAB — URINALYSIS, ROUTINE W REFLEX MICROSCOPIC
Bilirubin Urine: NEGATIVE
Glucose, UA: NEGATIVE mg/dL
Hgb urine dipstick: NEGATIVE
Ketones, ur: NEGATIVE mg/dL
Leukocytes, UA: NEGATIVE
Nitrite: NEGATIVE
Protein, ur: NEGATIVE mg/dL
Specific Gravity, Urine: 1.008 (ref 1.005–1.030)
Urobilinogen, UA: 1 mg/dL (ref 0.0–1.0)
pH: 8 (ref 5.0–8.0)

## 2013-11-22 MED ORDER — DEXTROSE-NACL 5-0.9 % IV SOLN
INTRAVENOUS | Status: AC
  Administered 2013-11-23: 03:00:00 via INTRAVENOUS

## 2013-11-22 MED ORDER — ONDANSETRON HCL 4 MG PO TABS: 4.0000 mg | ORAL_TABLET | Freq: Four times a day (QID) | ORAL | Status: DC | PRN

## 2013-11-22 MED ORDER — OLANZAPINE 5 MG PO TBDP
5.0000 mg | ORAL_TABLET | Freq: Every day | ORAL | Status: DC
  Administered 2013-11-23 – 2013-11-26 (×5): 5 mg via ORAL
  Filled 2013-11-22 (×6): qty 1

## 2013-11-22 MED ORDER — LORAZEPAM 2 MG/ML IJ SOLN
0.5000 mg | Freq: Four times a day (QID) | INTRAMUSCULAR | Status: DC | PRN
  Administered 2013-11-23 – 2013-11-25 (×4): 0.5 mg via INTRAVENOUS
  Filled 2013-11-22 (×5): qty 1

## 2013-11-22 MED ORDER — SODIUM CHLORIDE 0.9 % IV BOLUS (SEPSIS)
1000.0000 mL | Freq: Once | INTRAVENOUS | Status: AC
  Administered 2013-11-22: 1000 mL via INTRAVENOUS

## 2013-11-22 MED ORDER — ACETAMINOPHEN 650 MG RE SUPP: 650.0000 mg | Freq: Four times a day (QID) | RECTAL | Status: DC | PRN

## 2013-11-22 MED ORDER — ALBUTEROL SULFATE (2.5 MG/3ML) 0.083% IN NEBU: 2.5000 mg | INHALATION_SOLUTION | RESPIRATORY_TRACT | Status: DC | PRN

## 2013-11-22 MED ORDER — METHYLPREDNISOLONE SODIUM SUCC 40 MG IJ SOLR
20.0000 mg | Freq: Every day | INTRAMUSCULAR | Status: DC
  Administered 2013-11-23: 20 mg via INTRAVENOUS
  Filled 2013-11-22: qty 0.5

## 2013-11-22 MED ORDER — LEVOFLOXACIN IN D5W 500 MG/100ML IV SOLN
500.0000 mg | INTRAVENOUS | Status: DC
  Administered 2013-11-23: 500 mg via INTRAVENOUS
  Filled 2013-11-22: qty 100

## 2013-11-22 MED ORDER — ACETAMINOPHEN 325 MG PO TABS
650.0000 mg | ORAL_TABLET | Freq: Four times a day (QID) | ORAL | Status: DC | PRN
  Administered 2013-11-23 (×3): 650 mg via ORAL
  Filled 2013-11-22 (×3): qty 2

## 2013-11-22 MED ORDER — VALPROATE SODIUM 500 MG/5ML IV SOLN
500.0000 mg | Freq: Every day | INTRAVENOUS | Status: DC
  Administered 2013-11-23: 500 mg via INTRAVENOUS
  Filled 2013-11-22 (×2): qty 5

## 2013-11-22 MED ORDER — ENOXAPARIN SODIUM 40 MG/0.4ML ~~LOC~~ SOLN
40.0000 mg | SUBCUTANEOUS | Status: DC
  Administered 2013-11-23 – 2013-11-27 (×5): 40 mg via SUBCUTANEOUS
  Filled 2013-11-22 (×6): qty 0.4

## 2013-11-22 MED ORDER — VALPROATE SODIUM 500 MG/5ML IV SOLN
250.0000 mg | Freq: Every day | INTRAVENOUS | Status: DC
  Administered 2013-11-23: 250 mg via INTRAVENOUS
  Filled 2013-11-22: qty 2.5

## 2013-11-22 MED ORDER — ONDANSETRON HCL 4 MG/2ML IJ SOLN
4.0000 mg | Freq: Four times a day (QID) | INTRAMUSCULAR | Status: DC | PRN
  Filled 2013-11-22: qty 2

## 2013-11-22 MED ORDER — LORAZEPAM 2 MG/ML IJ SOLN
1.0000 mg | Freq: Once | INTRAMUSCULAR | Status: AC
  Administered 2013-11-22: 1 mg via INTRAVENOUS
  Filled 2013-11-22: qty 1

## 2013-11-22 NOTE — Assessment & Plan Note (Signed)
Patient and Mom very anxious and shakey in office due to poor health

## 2013-11-22 NOTE — ED Notes (Signed)
Per mother, wheelchair to be taken home with her.

## 2013-11-22 NOTE — Progress Notes (Signed)
Patient ID: [name], male   DOB: 02-Jun-1986, 28 y.o.   MRN: [id number] [name] [id number] 02-21-1986 11/22/2013      Progress Note-Follow Up  Subjective  Chief Complaint  Chief Complaint  Patient presents with  . Establish Care    new patient/ transfer from Dr Charlett Blake    HPI  Patient is a 28 year old Caucasian male in today with his mother for urgent new patient appt. He has a very long and complicated medical history which has been made significantly worse by an acute respiratory illness this week. 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 He was in his usual state of health and cared for at home by his parents until about a week ago. He developed a cough roughly 10 days ago then proceeded to develop fevers, anorexia, agitation, constipation and edema roughly 5 days ago. He was seen by our pulmonologist 3 days ago and made NPO for risk of aspiration pneumonia. A feeding pump was ordered but has not arrived. Mom notes no more than 10 cc of liquids have gone in to him daily for the past week until today which is only slightly better. As a result he has alos had decreased urine output and constipation. Did have a small movement today after some prune juice but mom notes his GI tract is moving very slowly and fluids are not going in to his PEG tube easily. He has been coughing and getting excessively agitated. Has not slept in over a week and parents and patient are exhausted.   Past Medical History  Diagnosis Date  . Cerebral palsy   . GERD (gastroesophageal reflux disease)   . Anxiety   . Depression   . Thyroid disease     hyper  . Incontinence of feces   . Palpitations   . Esophagitis     Past Surgical History  Procedure Laterality Date  . Spine surgery  ,11/20/2010, 2011  . Eye surgery    . Ears tubes    . Hamstring released    . Baclofen trial    . Baslofen pump  implant    . Spinal fusion    . G-tube insert  August 2006  . Spinal fusioncorrect 106 degree kyphosis    . Spinal fusion to correct 70 degree kyphosis    . Tonsillectomy    . Peg placement  10/21/2011    Procedure: PERCUTANEOUS ENDOSCOPIC GASTROSTOMY (PEG) REPLACEMENT;  Surgeon: [name], MD;  Location: [facility];  Service: Endoscopy;  Laterality: N/A;  . Peg placement N/A 06/13/2013    Procedure: PERCUTANEOUS ENDOSCOPIC GASTROSTOMY (PEG) REPLACEMENT;  Surgeon: [name], MD;  Location: [facility];  Service: Endoscopy;  Laterality: N/A;    Family History  Problem Relation Age of Onset  . Asthma Mother   . Hyperlipidemia Mother   . COPD Mother   . Other Mother     bronchial stasis/ABPA  . Cancer Maternal Grandmother 68    breast  . Hyperlipidemia Maternal Grandmother   . Hypertension Maternal Grandmother   . Cancer Maternal Grandfather     prostate  . Heart disease Paternal Grandfather     CHF  . Osteoporosis Paternal Grandmother   . Arthritis Paternal Grandmother     rheumatoid    History   Social History  . Marital Status: Single    Spouse Name: N/A    Number of Children: 0  .  Years of Education: N/A   Occupational History  . disbaled    Social History Main Topics  . Smoking status: Never Smoker   . Smokeless tobacco: Never Used  . Alcohol Use: No  . Drug Use: No  . Sexual Activity: No   Other Topics Concern  . Not on file   Social History Narrative  . No narrative on file    Current Outpatient Prescriptions on File Prior to Visit  Medication Sig Dispense Refill  . AMBULATORY NON FORMULARY MEDICATION Medication Name: MIC gastrostomy/bolus feeding tube 24 French Part number 0110-24. #2 and 10 cc lurer lock syringe #2 Dx:  2 Device  1  . DEPAKOTE SPRINKLES 125 MG capsule TAKE 2 CAPSULES EVERY MORNING AND 4 CAPSULES EVERY EVENING VIA G TUBE.  180 capsule  0  . diazepam (VALIUM) 5 MG tablet Take 5 mg by mouth 2 (two) times daily as needed for  anxiety.      Marland Kitchen HYDROcodone-homatropine (HYCODAN) 5-1.5 MG/5ML syrup Take 5 mLs by mouth every 6 (six) hours as needed for cough.  120 mL  0  . levofloxacin (LEVAQUIN) 25 MG/ML solution Take 20 mLs (500 mg total) by mouth daily.  140 mL  0  . OLANZapine (ZYPREXA) 5 MG tablet TAKE 1 TABLET BY MOUTH AT BEDTIME.  30 tablet  2  . omeprazole (PRILOSEC) 40 MG capsule Take 1 capsule (40 mg total) by mouth 2 (two) times daily.  60 capsule  2  . PARoxetine (PAXIL) 30 MG tablet TAKE 1 TABLET BY MOUTH DAILY.  30 tablet  2  . polyethylene glycol (MIRALAX) packet Give 17 g by tube every morning.       . predniSONE 5 MG/5ML solution Take 20mg  with food x5 days, then 10mg  with food x5 days then STOP  150 mL  0  . Probiotic Product (ADVANCED PROBIOTIC 10) CAPS Give 1 capsule by tube daily.       . sucralfate (CARAFATE) 1 G tablet 1 g by PEG Tube route as needed.        No current facility-administered medications on file prior to visit.    Allergies  Allergen Reactions  . Sulfonamide Derivatives Rash    Review of Systems: see below  Physical Exam  Constitutional: He appears distressed.  HENT:  Head: Normocephalic and atraumatic.  Dry mucus membranes, very dry lips  Eyes: Conjunctivae are normal.  Neck: Neck supple. No thyromegaly present.  Cardiovascular: Regular rhythm and normal heart sounds.   No murmur heard. tachycardia  Pulmonary/Chest: Effort normal. No respiratory distress.  Rhonchi b/l bases  Abdominal: Bowel sounds are normal. He exhibits no distension and no mass. There is no tenderness.  Peg tube LUQ, prune juice visible in external tube.  Musculoskeletal: He exhibits no edema.  Neurological: He is alert.  Very contracted, spastic extremities. Intermittent episodes of acute tremulousness  Skin: Skin is warm.  Psychiatric:  agitated    Objective  BP 110/82  Pulse 103  Temp(Src) 98.2 F (36.8 C) (Axillary)  Ht 5\' 2"  (1.575 m)  SpO2 93%  Physical Exam see above  Review  of Systems  Constitutional: Positive for fever and malaise/fatigue.  HENT: Positive for congestion.   Eyes: Negative for discharge.  Respiratory: Positive for cough and sputum production. Negative for shortness of breath.   Cardiovascular: Negative for chest pain, palpitations and leg swelling.  Gastrointestinal: Positive for constipation. Negative for nausea, abdominal pain, diarrhea, blood in stool and melena.  Genitourinary: Negative for dysuria.  Musculoskeletal: Negative for falls.  Skin: Negative for rash.  Neurological: Negative for loss of consciousness and headaches.  Endo/Heme/Allergies: Negative for polydipsia.  Psychiatric/Behavioral: Negative for depression and suicidal ideas. The patient has insomnia. The patient is not nervous/anxious.     Lab Results  Component Value Date   TSH 1.05 12/11/2010   Lab Results  Component Value Date   WBC 7.3 11/17/2013   HGB 13.2 11/17/2013   HCT 40.1 11/17/2013   MCV 88.3 11/17/2013   PLT 286 11/17/2013   Lab Results  Component Value Date   CREATININE 0.60 11/17/2013   BUN 11 11/17/2013   NA 142 11/17/2013   K 3.8 11/17/2013   CL 99 11/17/2013   CO2 28 11/17/2013   Lab Results  Component Value Date   ALT 8 07/04/2013   AST 13 07/04/2013   ALKPHOS 70 07/04/2013   BILITOT 0.3 07/04/2013   Lab Results  Component Value Date   CHOL 151 04/25/2010   Lab Results  Component Value Date   HDL 29.90* 04/25/2010   Lab Results  Component Value Date   LDLCALC 96 04/25/2010   Lab Results  Component Value Date   TRIG 125.0 04/25/2010   Lab Results  Component Value Date   CHOLHDL 5 04/25/2010     Assessment & Plan  No problem-specific assessment & plan notes found for this encounter.

## 2013-11-22 NOTE — Telephone Encounter (Signed)
Spoke with Mindy, no one has called pt/mother today Called spoke with [name], advised that the message she is returning is left over from yesterday - [name] okay with this and verbalized her understanding Pt is at Dr Azalee Course office now, and that office is trying to admit pt - wished [name] "good luck" and hope that pt gets everything he needs Will sign off

## 2013-11-22 NOTE — Assessment & Plan Note (Signed)
Has been told to stay NPO but is not getting adequate peg tube feeds at this time, would benefit from some urgent labs and at minimimum IVF but speech evaluation and nutrition evaluation to help establish appropriate po intake would be appropriate

## 2013-11-22 NOTE — ED Notes (Signed)
Sent here from [facility] for dehydration and possible admission 10 days of decreased nutritional intake and URi symptoms

## 2013-11-22 NOTE — Assessment & Plan Note (Signed)
Placed on levaquin and prednisone by pulmonology this week, so far only one dose of Levaquin and 2 of prednisone. Still having fevers, cough. NPO

## 2013-11-22 NOTE — Assessment & Plan Note (Signed)
No PO this week. Very minimal fluids via PEG tube due to poor GI motility and constipation this week.

## 2013-11-22 NOTE — Progress Notes (Addendum)
25XA male w/ complicated PMH c/o cough x10d developing into fevers, anorexia, agitation, constipation, insomnia, and edema, admitted w/ dehydration, to begin IV ABX for bronchitis; pt known to have h/o aspiration PNA though has been NPO for a few days by outpt pulmonologist for aspiration prevention.  Will begin Unasyn 1.5g IV Q6H for CrCl ~90 ml/min and monitor CBC, Cx, clinical progression.  Wynona Neat, PharmD, BCPS  11/22/2013 11:15 PM

## 2013-11-22 NOTE — Progress Notes (Signed)
Pre visit review using our clinic review tool, if applicable. No additional management support is needed unless otherwise documented below in the visit note. 

## 2013-11-22 NOTE — ED Provider Notes (Signed)
CSN: [id number]     Arrival date & time 11/22/13  1521 History   First MD Initiated Contact with Patient 11/22/13 1530     Chief Complaint  Patient presents with  . Dehydration   (Consider location/radiation/quality/duration/timing/severity/associated sxs/prior Treatment) HPI Comments: Sent by PCP for conerns of dehdyration.  Patient is a 28 y.o. male presenting with cough. The history is provided by the patient.  Cough Cough characteristics:  Productive Severity:  Moderate Onset quality:  Gradual Duration:  10 days Timing:  Constant Progression:  Worsening Chronicity:  New Smoker: no   Context: not sick contacts   Relieved by:  Nothing Worsened by:  Nothing tried Ineffective treatments: antibiotics, steroids. Associated symptoms: fever   Associated symptoms: no chest pain and no sore throat     Past Medical History  Diagnosis Date  . Cerebral palsy   . GERD (gastroesophageal reflux disease)   . Anxiety   . Depression   . Thyroid disease     hyper  . Incontinence of feces   . Palpitations   . Esophagitis   . Dehydration 11/22/2013   Past Surgical History  Procedure Laterality Date  . Spine surgery  ,11/20/2010, 2011  . Eye surgery    . Ears tubes    . Hamstring released    . Baclofen trial    . Baslofen pump implant    . Spinal fusion    . G-tube insert  August 2006  . Spinal fusioncorrect 106 degree kyphosis    . Spinal fusion to correct 70 degree kyphosis    . Tonsillectomy    . Peg placement  10/21/2011    Procedure: PERCUTANEOUS ENDOSCOPIC GASTROSTOMY (PEG) REPLACEMENT;  Surgeon: [name], MD;  Location: [facility];  Service: Endoscopy;  Laterality: N/A;  . Peg placement N/A 06/13/2013    Procedure: PERCUTANEOUS ENDOSCOPIC GASTROSTOMY (PEG) REPLACEMENT;  Surgeon: [name], MD;  Location: [facility];  Service: Endoscopy;  Laterality: N/A;   Family History  Problem Relation Age of Onset  . Asthma Mother   . Hyperlipidemia Mother   . COPD  Mother   . Other Mother     bronchial stasis/ABPA  . Cancer Maternal Grandmother 73    breast  . Hyperlipidemia Maternal Grandmother   . Hypertension Maternal Grandmother   . Cancer Maternal Grandfather     prostate  . Heart disease Paternal Grandfather     CHF  . Osteoporosis Paternal Grandmother   . Arthritis Paternal Grandmother     rheumatoid   History  Substance Use Topics  . Smoking status: Never Smoker   . Smokeless tobacco: Never Used  . Alcohol Use: No    Review of Systems  Constitutional: Positive for fever.  HENT: Positive for congestion. Negative for sore throat.   Respiratory: Positive for cough.   Cardiovascular: Negative for chest pain.  All other systems reviewed and are negative.    Allergies  Sulfonamide derivatives  Home Medications   Current Outpatient Rx  Name  Route  Sig  Dispense  Refill  . AMBULATORY NON FORMULARY MEDICATION      Medication Name: MIC gastrostomy/bolus feeding tube 24 French Part number 0110-24. #2 and 10 cc lurer lock syringe #2 Dx:   2 Device   1     Dx: 783.3 abd 343.9   . DEPAKOTE SPRINKLES 125 MG capsule      TAKE 2 CAPSULES EVERY MORNING AND 4 CAPSULES EVERY EVENING VIA G TUBE.   180 capsule  0     Dispense as written.   . diazepam (VALIUM) 5 MG tablet   Oral   Take 5 mg by mouth 2 (two) times daily as needed for anxiety.         Marland Kitchen HYDROcodone-homatropine (HYCODAN) 5-1.5 MG/5ML syrup   Oral   Take 5 mLs by mouth every 6 (six) hours as needed for cough.   120 mL   0   . levofloxacin (LEVAQUIN) 25 MG/ML solution   Oral   Take 20 mLs (500 mg total) by mouth daily.   140 mL   0   . OLANZapine (ZYPREXA) 5 MG tablet      TAKE 1 TABLET BY MOUTH AT BEDTIME.   30 tablet   2   . omeprazole (PRILOSEC) 40 MG capsule   Oral   Take 1 capsule (40 mg total) by mouth 2 (two) times daily.   60 capsule   2   . PARoxetine (PAXIL) 30 MG tablet      TAKE 1 TABLET BY MOUTH DAILY.   30 tablet   2   .  polyethylene glycol (MIRALAX) packet   Tube   Give 17 g by tube every morning.          . predniSONE 5 MG/5ML solution      Take 20mg  with food x5 days, then 10mg  with food x5 days then STOP   150 mL   0   . Probiotic Product (ADVANCED PROBIOTIC 10) CAPS   Tube   Give 1 capsule by tube daily.          . sucralfate (CARAFATE) 1 G tablet   PEG Tube   1 g by PEG Tube route as needed.           BP 114/70  Pulse 64  Temp(Src) 98.2 F (36.8 C) (Axillary)  Resp 16  Wt 115 lb (52.164 kg)  SpO2 99% Physical Exam  Nursing note and vitals reviewed. Constitutional: He appears well-developed and well-nourished. No distress.  HENT:  Head: Normocephalic and atraumatic.  Right Ear: Tympanic membrane normal.  Left Ear: Tympanic membrane normal.  Mouth/Throat: Mucous membranes are dry (severe). No oropharyngeal exudate.  Eyes: EOM are normal. Pupils are equal, round, and reactive to light.  Neck: Normal range of motion. Neck supple.  Cardiovascular: Normal rate and regular rhythm.  Exam reveals no friction rub.   No murmur heard. Pulmonary/Chest: Effort normal and breath sounds normal. No respiratory distress. He has no wheezes. He has no rales.  Abdominal: He exhibits no distension. There is no tenderness. There is no rebound.    Musculoskeletal: Normal range of motion. He exhibits no edema.  Neurological: He is alert. No cranial nerve deficit. He exhibits normal muscle tone.  Follows commands, verbal, but speech is difficult to understand. All limbs with contractures  Skin: No rash noted. He is not diaphoretic.    ED Course  Procedures (including critical care time) Labs Review Labs Reviewed  CBC  COMPREHENSIVE METABOLIC PANEL  URINALYSIS, ROUTINE W REFLEX MICROSCOPIC   Imaging Review Dg Chest 2 View  11/22/2013   CLINICAL DATA:  Dehydration, upper respiratory symptoms  EXAM: CHEST  2 VIEW  COMPARISON:  11/17/2013; 08/04/2013  FINDINGS: Grossly unchanged cardiac  silhouette and mediastinal contours. There is grossly unchanged mild diffuse slightly nodular thickening of the pulmonary interstitium. Perihilar heterogeneous opacities are grossly unchanged, right greater than left, likely atelectasis. No new discrete focal airspace opacities. No definite pleural effusion or pneumothorax. Grossly  unchanged bones including long segment paraspinal cervical, thoracic and lumbar paraspinal fusion. Depending catheter tubing again overlies the medial aspect of the right side of the mediastinum.  IMPRESSION: Grossly unchanged perihilar atelectasis without acute cardiopulmonary disease.   Electronically Signed   By: [name] M.D.   On: 11/22/2013 16:39   Dg Abd 1 View  11/22/2013   CLINICAL DATA:  GT placement  EXAM: ABDOMEN - 1 VIEW  COMPARISON:  None.  FINDINGS: Contrast has been injected into the gastrostomy tube. The tip is in the body of the stomach. Contrast fills the stomach and proximal small bowel without evidence of extravasation.  IMPRESSION: Gastrostomy tube is positioned in the body of the stomach.   Electronically Signed   By: Maryclare Bean M.D.   On: 11/22/2013 18:50    EKG Interpretation    Date/Time:    Ventricular Rate:    PR Interval:    QRS Duration:   QT Interval:    QTC Calculation:   R Axis:     Text Interpretation:              MDM   1. Dehydration    28 year old male with history of cerebral palsy, spastic quadriplegia with contractures presents with dehydration. He was seen at his primary care doctor's office today for persistent cough, inability to feed. His primary care doctor sent here for further evaluation.   mom is having difficulty with feeding and she'll give a can of Ensure, and 8 hours later she can pull out the entire can of Ensure from the G-tube. She is concerned that his digestive system is not moving. He also has problems with dysphasia and taking by mouth, so she thinks everything is coughing up he is swelling and he  has had a strong risk for aspiration. He's had fevers up to 101 over the past and days, but none in the past 2 days. He is also on steroids for a few days given by his pulmonologist, but is not on them in the past couple days. He was started on Levaquin by his pulmonologist also. Patient here is coughing. He is afebrile with stable vitals. He has rhonchorous lung sounds, however is difficult as he makes a lot of upper airway noise with each breath. His belly is soft and benign. His mucous membranes are extremely dry. I am concerned he is dehydrated. Will start labs, fluids, chest x-ray. Labs normal. CXR without infiltrate. Still clinically dehydrated. Patient given fluids, admitted. KUB ordered per hospitalist to check G-tube placement.   Osvaldo Shipper, MD 11/23/13 [phone]

## 2013-11-23 ENCOUNTER — Inpatient Hospital Stay (HOSPITAL_COMMUNITY): Payer: Managed Care, Other (non HMO)

## 2013-11-23 DIAGNOSIS — E43 Unspecified severe protein-calorie malnutrition: Secondary | ICD-10-CM | POA: Insufficient documentation

## 2013-11-23 LAB — CBC
HCT: 39.8 % (ref 39.0–52.0)
Hemoglobin: 13 g/dL (ref 13.0–17.0)
MCH: 29.6 pg (ref 26.0–34.0)
MCHC: 32.7 g/dL (ref 30.0–36.0)
MCV: 90.7 fL (ref 78.0–100.0)
Platelets: 326 10*3/uL (ref 150–400)
RBC: 4.39 MIL/uL (ref 4.22–5.81)
RDW: 14.4 % (ref 11.5–15.5)
WBC: 5.8 10*3/uL (ref 4.0–10.5)

## 2013-11-23 LAB — GLUCOSE, CAPILLARY
Glucose-Capillary: 103 mg/dL — ABNORMAL HIGH (ref 70–99)
Glucose-Capillary: 97 mg/dL (ref 70–99)
Glucose-Capillary: 98 mg/dL (ref 70–99)

## 2013-11-23 LAB — AMMONIA: Ammonia: 49 umol/L (ref 11–60)

## 2013-11-23 LAB — CBC WITH DIFFERENTIAL/PLATELET
Basophils Absolute: 0 10*3/uL (ref 0.0–0.1)
Basophils Relative: 0 % (ref 0–1)
Eosinophils Absolute: 0.1 10*3/uL (ref 0.0–0.7)
Eosinophils Relative: 1 % (ref 0–5)
HCT: 41 % (ref 39.0–52.0)
Hemoglobin: 13.3 g/dL (ref 13.0–17.0)
Lymphocytes Relative: 45 % (ref 12–46)
Lymphs Abs: 2.9 10*3/uL (ref 0.7–4.0)
MCH: 29.4 pg (ref 26.0–34.0)
MCHC: 32.4 g/dL (ref 30.0–36.0)
MCV: 90.5 fL (ref 78.0–100.0)
Monocytes Absolute: 0.5 10*3/uL (ref 0.1–1.0)
Monocytes Relative: 8 % (ref 3–12)
Neutro Abs: 2.9 10*3/uL (ref 1.7–7.7)
Neutrophils Relative %: 45 % (ref 43–77)
Platelets: 336 10*3/uL (ref 150–400)
RBC: 4.53 MIL/uL (ref 4.22–5.81)
RDW: 14.5 % (ref 11.5–15.5)
WBC: 6.3 10*3/uL (ref 4.0–10.5)

## 2013-11-23 LAB — COMPREHENSIVE METABOLIC PANEL
ALT: 16 U/L (ref 0–53)
AST: 24 U/L (ref 0–37)
Albumin: 3.4 g/dL — ABNORMAL LOW (ref 3.5–5.2)
Alkaline Phosphatase: 61 U/L (ref 39–117)
BUN: 4 mg/dL — ABNORMAL LOW (ref 6–23)
CO2: 23 mEq/L (ref 19–32)
Calcium: 8.6 mg/dL (ref 8.4–10.5)
Chloride: 101 mEq/L (ref 96–112)
Creatinine, Ser: 0.48 mg/dL — ABNORMAL LOW (ref 0.50–1.35)
GFR calc Af Amer: >90 mL/min (ref >90)
GFR calc non Af Amer: >90 mL/min (ref >90)
Glucose, Bld: 83 mg/dL (ref 70–99)
Potassium: 3.9 mEq/L (ref 3.7–5.3)
Sodium: 140 mEq/L (ref 137–147)
Total Bilirubin: 0.4 mg/dL (ref 0.3–1.2)
Total Protein: 6.5 g/dL (ref 6.0–8.3)

## 2013-11-23 LAB — CREATININE, SERUM
Creatinine, Ser: 0.49 mg/dL — ABNORMAL LOW (ref 0.50–1.35)
GFR calc Af Amer: >90 mL/min (ref >90)
GFR calc non Af Amer: >90 mL/min (ref >90)

## 2013-11-23 LAB — VALPROIC ACID LEVEL: Valproic Acid Lvl: 34.4 ug/mL — ABNORMAL LOW (ref 50.0–100.0)

## 2013-11-23 LAB — TSH: TSH: 0.519 u[IU]/mL (ref 0.350–4.500)

## 2013-11-23 LAB — INFLUENZA PANEL BY PCR (TYPE A & B)
H1N1 flu by pcr: NOT DETECTED
Influenza A By PCR: NEGATIVE
Influenza B By PCR: NEGATIVE

## 2013-11-23 MED ORDER — OSMOLITE 1.2 CAL PO LIQD
1000.0000 mL | ORAL | Status: DC
  Administered 2013-11-23: 1000 mL
  Filled 2013-11-23 (×4): qty 1000

## 2013-11-23 MED ORDER — POLYETHYLENE GLYCOL 3350 17 G PO PACK
17.0000 g | PACK | Freq: Every evening | ORAL | Status: DC
  Administered 2013-11-25: 17 g
  Filled 2013-11-23 (×6): qty 1

## 2013-11-23 MED ORDER — PAROXETINE HCL 30 MG PO TABS
30.0000 mg | ORAL_TABLET | Freq: Every day | ORAL | Status: DC
  Administered 2013-11-23 – 2013-11-26 (×4): 30 mg via ORAL
  Filled 2013-11-23 (×5): qty 1

## 2013-11-23 MED ORDER — OLANZAPINE 5 MG PO TABS: 5.0000 mg | ORAL_TABLET | Freq: Every day | ORAL | Status: DC

## 2013-11-23 MED ORDER — SODIUM BICARBONATE 650 MG PO TABS
650.0000 mg | ORAL_TABLET | Freq: Once | ORAL | Status: AC
  Administered 2013-11-23: 650 mg via ORAL
  Filled 2013-11-23: qty 1

## 2013-11-23 MED ORDER — IOHEXOL 300 MG/ML  SOLN
100.0000 mL | Freq: Once | INTRAMUSCULAR | Status: AC | PRN
  Administered 2013-11-23: 100 mL via INTRAVENOUS

## 2013-11-23 MED ORDER — PANTOPRAZOLE SODIUM 40 MG PO TBEC
40.0000 mg | DELAYED_RELEASE_TABLET | Freq: Every day | ORAL | Status: DC
Start: 2013-11-23 — End: 2013-11-24
  Administered 2013-11-23: 40 mg via ORAL
  Filled 2013-11-23 (×2): qty 1

## 2013-11-23 MED ORDER — SODIUM CHLORIDE 0.9 % IV SOLN
1.5000 g | Freq: Four times a day (QID) | INTRAVENOUS | Status: DC
  Administered 2013-11-23 – 2013-11-25 (×10): 1.5 g via INTRAVENOUS
  Filled 2013-11-23 (×12): qty 1.5

## 2013-11-23 MED ORDER — DIAZEPAM 5 MG PO TABS
5.0000 mg | ORAL_TABLET | Freq: Three times a day (TID) | ORAL | Status: DC | PRN
  Administered 2013-11-23 – 2013-11-27 (×7): 5 mg via ORAL
  Filled 2013-11-23 (×8): qty 1

## 2013-11-23 MED ORDER — DIVALPROEX SODIUM 125 MG PO CPSP
250.0000 mg | ORAL_CAPSULE | Freq: Every day | ORAL | Status: DC
  Administered 2013-11-24 – 2013-11-27 (×4): 250 mg via ORAL
  Filled 2013-11-23 (×5): qty 2

## 2013-11-23 MED ORDER — DIVALPROEX SODIUM 125 MG PO CPSP
500.0000 mg | ORAL_CAPSULE | Freq: Every day | ORAL | Status: DC
  Administered 2013-11-23 – 2013-11-26 (×4): 500 mg via ORAL
  Filled 2013-11-23 (×6): qty 4

## 2013-11-23 MED ORDER — PANCRELIPASE (LIP-PROT-AMYL) 12000-38000 UNITS PO CPEP
2.0000 | ORAL_CAPSULE | Freq: Once | ORAL | Status: AC
  Administered 2013-11-23: 2 via ORAL
  Filled 2013-11-23: qty 2

## 2013-11-23 NOTE — Progress Notes (Signed)
Utilization review completed. Carlon Chaloux, RN, BSN. 

## 2013-11-23 NOTE — Progress Notes (Signed)
INITIAL NUTRITION ASSESSMENT  DOCUMENTATION CODES Per approved criteria  -Severe malnutrition in the context of acute illness or injury   INTERVENTION: 1.  Patient at risk for refeeding syndrome given no PO intake for the past 7 days. Recommend monitoring magnesium, potassium, and phosphorous for 3 days. 2.  Recommend bolus feeds, 2-8 fl oz cans Osmolite 1.2 TID to provide 1710 kcals, 79 grams protein, and 1164 grams free water. 3.  If bolus feeds are not tolerated, recommend continuous feed, start at 30 mL/hr and advancing by 10 mL every 12 hours to reach goal rate of 60 mL/hr to provide 1728 kcal, 80 grams protein and 1181 grams free water.   NUTRITION DIAGNOSIS: Inadequate oral intake related to inability to eat as evidenced by NPO status.   Goal: Patient to meet >/=90% of estimated nutrition needs  Monitor:  PO diet advancement, EN tolerance, Weight trends, Lab trends, I/Os  Reason for Assessment: Malnutrition Screening Tool Risk  28 y.o. male  Admitting Dx: Dehydration  ASSESSMENT: Patient is a 28 y.o. male with history of cerebral palsy completely dependent on daily living activities, generalized anxiety disorder and depression has been having poor oral intake since January 11 of this month. Patient has been having productive cough since January 11 with wheezing and patient has been having poor oral intake with water the patient takes in even through PEG tube patient is to regurgitate. Patient had fever as per patient's mother with temperatures around 101F from January 11-15. Patient has a history of aspiration PNA.   Patient is currently NPO. Per patient's aunt, patient normally consumes soft foods like apple sauce and mashed potatoes PO, but only receives liquids via PEG tube. However, for the past week the patient's aunt reports that the patient has ben unable to take in any food PO and only 10 mL of liquid via PEG tube. The patient's aunt stated that they have attempted to  provide Ensure via PEG, but "it was pushed back out."   Per medical records, the patient has had decreased urine output and constipation; fluids have not been going into PEG tube easily. Using pancreatic enzymes protocol, feeding tube unclogged and flushes easily (1/22).  According to patient's aunt, the patient's usual weight is 124-125 lb. Current weight is reported at 106. The patient's aunt is concerned about the recent weight loss.   Nutrition Focused Physical Exam:  Subcutaneous Fat:  Orbital Region:  WNL Upper Arm Region: WNL Thoracic and Lumbar Region: moderate depletion  Muscle:  Temple Region: WNL Clavicle Bone Region: moderate depletion Clavicle and Acromion Bone Region: mild depletion Scapular Bone Region: N/A Dorsal Hand: N/A Patellar Region: severe depletion* Anterior Thigh Region: severe depletion* Posterior Calf Region: severe depletion*  Edema: absent  *severe depletion is not abnormal given his condition  Patient meets criteria for severe malnutrition in the context of acute illness as evidenced by >5% weight loss in 1 month, </= 50% energy intake for >/=5 days, and moderate muscle mass and body fat depletion.   Height: Ht Readings from Last 1 Encounters:  11/22/13 5\' 2"  (1.575 m)    Weight: Wt Readings from Last 1 Encounters:  11/22/13 106 lb 4.2 oz (48.2 kg)    Ideal Body Weight: 100-106 lb   % Ideal Body Weight: 100%  Wt Readings from Last 10 Encounters:  11/22/13 106 lb 4.2 oz (48.2 kg)  11/20/13 115 lb (52.164 kg)  11/17/13 115 lb (52.164 kg)  08/31/13 115 lb (52.164 kg)  08/23/13 115 lb (52.164  kg)  08/22/13 115 lb (52.164 kg)  08/05/13 105 lb 2.6 oz (47.7 kg)  07/11/13 115 lb (52.164 kg)  07/04/13 115 lb (52.164 kg)  06/13/13 120 lb (54.432 kg)    Usual Body Weight: 124 lb  % Usual Body Weight: 85%  BMI:  Body mass index is 19.43 kg/(m^2).  Estimated Nutritional Needs: Kcal: 1550-1750 Protein: 75-85 grams Fluid: 1.5-1.7  L  Skin: no wounds  Diet Order: NPO  EDUCATION NEEDS: -No education needs identified at this time   Intake/Output Summary (Last 24 hours) at 11/23/13 1142 Last data filed at 11/23/13 1100  Gross per 24 hour  Intake      0 ml  Output   1025 ml  Net  -1025 ml    Last BM: 1/20  Labs:   Recent Labs Lab 11/17/13 1810 11/22/13 1600 11/22/13 2259 11/23/13 0343  NA 142 142  --  140  K 3.8 3.8  --  3.9  CL 99 100  --  101  CO2 28 28  --  23  BUN 11 6  --  4*  CREATININE 0.60 0.50 0.49* 0.48*  CALCIUM 9.4 9.2  --  8.6  GLUCOSE 87 94  --  83    CBG (last 3)  No results found for this basename: GLUCAP,  in the last 72 hours  Scheduled Meds: . ampicillin-sulbactam (UNASYN) IV  1.5 g Intravenous Q6H  . enoxaparin (LOVENOX) injection  40 mg Subcutaneous Q24H  . methylPREDNISolone (SOLU-MEDROL) injection  20 mg Intravenous Daily  . OLANZapine zydis  5 mg Oral QHS  . valproate sodium  250 mg Intravenous Daily  . valproate sodium  500 mg Intravenous QHS    Continuous Infusions: . dextrose 5 % and 0.9% NaCl 75 mL/hr at 11/23/13 6384    Past Medical History  Diagnosis Date  . Cerebral palsy   . GERD (gastroesophageal reflux disease)   . Anxiety   . Depression   . Thyroid disease     hyper  . Incontinence of feces   . Palpitations   . Esophagitis   . Dehydration 11/22/2013    Past Surgical History  Procedure Laterality Date  . Spine surgery  ,11/20/2010, 2011  . Eye surgery    . Ears tubes    . Hamstring released    . Baclofen trial    . Baslofen pump implant    . Spinal fusion    . G-tube insert  August 2006  . Spinal fusioncorrect 106 degree kyphosis    . Spinal fusion to correct 70 degree kyphosis    . Tonsillectomy    . Peg placement  10/21/2011    Procedure: PERCUTANEOUS ENDOSCOPIC GASTROSTOMY (PEG) REPLACEMENT;  Surgeon: [name], MD;  Location: [facility];  Service: Endoscopy;  Laterality: N/A;  . Peg placement N/A 06/13/2013    Procedure:  PERCUTANEOUS ENDOSCOPIC GASTROSTOMY (PEG) REPLACEMENT;  Surgeon: [name], MD;  Location: [facility];  Service: Endoscopy;  Laterality: N/A;    Claudell Kyle, Dietetic Intern Pager: [phone]

## 2013-11-23 NOTE — Progress Notes (Signed)
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-prior Baclofen pump [removed 2011]-multiple spinal fusion surgeries, PEG 02/2012 for hydration,grade 3 kidney reflux Last Speech eval 10/14 severe dysphagiaa-currently dys 3 diet admitted [facility] hospital c prodcutive cough   Past medical history-As per Problem list Chart reviewed as below- reviewed  Consultants:  none  Procedures:  CT abd pelvis 1/22  Antibiotics:  Levaquin  Unasyn 1/22   Subjective  Well Verbal but inco andsiblemprehen   Objective    Interim History: none  Telemetry: nsr   Objective: Filed Vitals:   11/22/13 1526 11/22/13 1831 11/22/13 2006 11/23/13 0621  BP: 114/70 114/70 119/56 90/76  Pulse: 64 103 85 94  Temp: 98.2 F (36.8 C) 98.2 F (36.8 C) 98.5 F (36.9 C) 97.6 F (36.4 C)  TempSrc: Axillary  Axillary Oral  Resp: 16 16 16 16   Height:   5\' 2"  (1.575 m)   Weight: 52.164 kg (115 lb)  48.2 kg (106 lb 4.2 oz)   SpO2: 99% 99% 100% 97%    Intake/Output Summary (Last 24 hours) at 11/23/13 1256 Last data filed at 11/23/13 1100  Gross per 24 hour  Intake      0 ml  Output   1025 ml  Net  -1025 ml    Exam:  General: eomi, ncat, Body mass index is 19.43 kg/(m^2). Cardiovascular: s1 s2 no m/r/g Respiratory: clear Abdomen: soft, PEG tube area clean Skin soft Neuro spasticity +.  Moves all 4 limbs  Data Reviewed: Basic Metabolic Panel:  Recent Labs Lab 11/17/13 1810 11/22/13 1600 11/22/13 2259 11/23/13 0343  NA 142 142  --  140  K 3.8 3.8  --  3.9  CL 99 100  --  101  CO2 28 28  --  23  GLUCOSE 87 94  --  83  BUN 11 6  --  4*  CREATININE 0.60 0.50 0.49* 0.48*  CALCIUM 9.4 9.2  --  8.6   Liver Function Tests:  Recent Labs Lab 11/22/13 1600 11/23/13 0343  AST 25 24  ALT 19 16  ALKPHOS 65 61  BILITOT 0.3 0.4  PROT 7.4 6.5  ALBUMIN 3.9 3.4*   No results  found for this basename: LIPASE, AMYLASE,  in the last 168 hours  Recent Labs Lab 11/23/13 0825  AMMONIA 49   CBC:  Recent Labs Lab 11/17/13 1810 11/22/13 1600 11/22/13 2259 11/23/13 0343  WBC 7.3 6.4 5.8 6.3  NEUTROABS 4.1  --   --  2.9  HGB 13.2 13.7 13.0 13.3  HCT 40.1 41.8 39.8 41.0  MCV 88.3 89.3 90.7 90.5  PLT 286 363 326 336   Cardiac Enzymes: No results found for this basename: CKTOTAL, CKMB, CKMBINDEX, TROPONINI,  in the last 168 hours BNP: No components found with this basename: POCBNP,  CBG:  Recent Labs Lab 11/23/13 1225  GLUCAP 98    No results found for this or any previous visit (from the past 240 hour(s)).   Studies:              All Imaging reviewed and is as per above notation   Scheduled Meds: . ampicillin-sulbactam (UNASYN) IV  1.5 g Intravenous Q6H  . enoxaparin (LOVENOX) injection  40 mg Subcutaneous Q24H  . methylPREDNISolone (SOLU-MEDROL) injection  20 mg Intravenous Daily  . OLANZapine zydis  5 mg Oral QHS  . valproate sodium  250 mg  Intravenous Daily  . valproate sodium  500 mg Intravenous QHS   Continuous Infusions: . dextrose 5 % and 0.9% NaCl 75 mL/hr at 11/23/13 0307     Assessment/Plan: 1. Likely aspiration pneumonia-continue Unasyn, appreciate speech therapy input, will keep n.p.o.  D/c steroids. 2. Severe dysphagia-n.p.o. status, consult nutrition for acute feeds 3. Bipolar/[seizure?]-continue valproic acid, olanzapine 5 mg 4. Spastic cerebral palsy-chronic 5. S/p multiple surgeries   Code Status: Full Family Communication:  Discussed with WIfe, Aunt Disposition Plan:  inpatient   Verneita Griffes, MD  [name] Pager [phone] 11/23/2013, 12:56 PM    LOS: 1 day

## 2013-11-23 NOTE — H&P (Signed)
[name] History and Physical  [name] [id number] DOB: 1986/07/19 DOA: 11/22/2013  Referring physician: ER physician. PCP: Chad Homans, MD  Specialists: Dr. Kara [name]. Pulmonologist.  Chief Complaint: Poor oral intake.  HPI: [name] is a 28 y.o. male with history of cerebral palsy completely dependent on daily living activities, generalized anxiety disorder and depression has been having poor oral intake since January 11 of this month. Patient has been having productive cough since January 11 with wheezing and patient has been having poor oral intake with water the patient takes in even through PEG tube patient is to regurgitate. Patient had fever as per patient's mother with temperatures around 101F from January 11-15. Patient was taken to the ER and was placed on Tamiflu empirically. Eventual results show that influenza PCR has been negative. Due to patient's persistent cough and wheezing patient was taken to pulmonologist on January 19. Patient is placed on Levaquin and prednisone and was advised to stop oral feeds and feeding pump was arranged. Due to patient's poor oral intake patient was taken to the ER chest x-ray did not show any acute findings and x-ray abdomen shows G-tube in place. Due to patient's dehydration and poor oral intake patient has been on med for further management. As per patient's mother patient did not vomit and did not have any diarrhea. He has not had good bowel movements for last one week.  Review of Systems: As presented in the history of presenting illness, rest negative.  Past Medical History  Diagnosis Date  . Cerebral palsy   . GERD (gastroesophageal reflux disease)   . Anxiety   . Depression   . Thyroid disease     hyper  . Incontinence of feces   . Palpitations   . Esophagitis   . Dehydration 11/22/2013   Past Surgical History  Procedure Laterality Date  . Spine surgery  ,11/20/2010, 2011  . Eye surgery    . Ears  tubes    . Hamstring released    . Baclofen trial    . Baslofen pump implant    . Spinal fusion    . G-tube insert  August 2006  . Spinal fusioncorrect 106 degree kyphosis    . Spinal fusion to correct 70 degree kyphosis    . Tonsillectomy    . Peg placement  10/21/2011    Procedure: PERCUTANEOUS ENDOSCOPIC GASTROSTOMY (PEG) REPLACEMENT;  Surgeon: [name], MD;  Location: [facility];  Service: Endoscopy;  Laterality: N/A;  . Peg placement N/A 06/13/2013    Procedure: PERCUTANEOUS ENDOSCOPIC GASTROSTOMY (PEG) REPLACEMENT;  Surgeon: [name], MD;  Location: [facility];  Service: Endoscopy;  Laterality: N/A;   Social History:  reports that he has never smoked. He has never used smokeless tobacco. He reports that he does not drink alcohol or use illicit drugs. Where does patient live home. Can patient participate in ADLs? No.  Allergies  Allergen Reactions  . Sulfonamide Derivatives Rash    Family History:  Family History  Problem Relation Age of Onset  . Asthma Mother   . Hyperlipidemia Mother   . COPD Mother   . Other Mother     bronchial stasis/ABPA  . Cancer Maternal Grandmother 46    breast  . Hyperlipidemia Maternal Grandmother   . Hypertension Maternal Grandmother   . Cancer Maternal Grandfather     prostate  . Heart disease Paternal Grandfather     CHF  . Osteoporosis Paternal Grandmother   . Arthritis  Paternal Grandmother     rheumatoid      Prior to Admission medications   Medication Sig Start Date End Date Taking? Authorizing Provider  AMBULATORY NON FORMULARY MEDICATION Medication Name: MIC gastrostomy/bolus feeding tube 24 French Part number 0110-24. #2 and 10 cc lurer lock syringe #2 Dx: 08/22/13  Yes [name], MD  diazepam (VALIUM) 5 MG tablet Take 5 mg by mouth 3 (three) times daily as needed for anxiety.  09/06/13  Yes Yvonne R Lowne, DO  divalproex (DEPAKOTE SPRINKLE) 125 MG capsule Take 250-500 mg by mouth 2 (two) times daily. Takes  250mg  in the morning and 500mg  in the evening   Yes Historical Provider, MD  HYDROcodone-homatropine (HYCODAN) 5-1.5 MG/5ML syrup Take 5 mLs by mouth every 6 (six) hours as needed for cough.   Yes Historical Provider, MD  levofloxacin (LEVAQUIN) 25 MG/ML solution Take 500 mg by mouth daily.   Yes Historical Provider, MD  OLANZapine (ZYPREXA) 5 MG tablet Take 5 mg by mouth at bedtime.   Yes Historical Provider, MD  omeprazole (PRILOSEC) 40 MG capsule Take 40 mg by mouth 2 (two) times daily.   Yes Historical Provider, MD  PARoxetine (PAXIL) 30 MG tablet Take 30 mg by mouth at bedtime.   Yes Historical Provider, MD  polyethylene glycol (MIRALAX) packet Give 17 g by tube every evening.    Yes Historical Provider, MD  predniSONE 5 MG/5ML solution Take 10-20 mg by mouth daily with breakfast. Takes 20mg  daily for 5 days, then 10mg  daily for 5 days, then stop   Yes Historical Provider, MD  Probiotic Product (ADVANCED PROBIOTIC 10) CAPS Give 1 capsule by tube daily.    Yes Historical Provider, MD  sucralfate (CARAFATE) 1 G tablet 1 g by PEG Tube route as needed (for stomach problems).    Yes Historical Provider, MD    Physical Exam: Filed Vitals:   11/22/13 1526 11/22/13 1831 11/22/13 2006  BP: 114/70 114/70 119/56  Pulse: 64 103 85  Temp: 98.2 F (36.8 C) 98.2 F (36.8 C) 98.5 F (36.9 C)  TempSrc: Axillary  Axillary  Resp: 16 16 16   Height:   5\' 2"  (1.575 m)  Weight: 52.164 kg (115 lb)  48.2 kg (106 lb 4.2 oz)  SpO2: 99% 99% 100%     General:  Not in acute distress.  Eyes: Anicteric no pallor.  ENT: No discharge from the ears eyes nose mouth.  Neck: No mass felt.  Cardiovascular: S1-S2 heard.  Respiratory: Harsh breath sounds bilaterally.  Abdomen: PEG tube in place. Nontender soft bowel sounds present.  Skin: Small rash in the second toe of the foot.  Musculoskeletal: Patient's limbs are contracted.  Psychiatric: Patient has cerebral palsy.  Neurologic: Patient has cerebral  palsy and has contractures.  Labs on Admission:  Basic Metabolic Panel:  Recent Labs Lab 11/17/13 1810 11/22/13 1600 11/22/13 2259  NA 142 142  --   K 3.8 3.8  --   CL 99 100  --   CO2 28 28  --   GLUCOSE 87 94  --   BUN 11 6  --   CREATININE 0.60 0.50 0.49*  CALCIUM 9.4 9.2  --    Liver Function Tests:  Recent Labs Lab 11/22/13 1600  AST 25  ALT 19  ALKPHOS 65  BILITOT 0.3  PROT 7.4  ALBUMIN 3.9   No results found for this basename: LIPASE, AMYLASE,  in the last 168 hours No results found for this basename: AMMONIA,  in the last 168 hours CBC:  Recent Labs Lab 11/17/13 1810 11/22/13 1600 11/22/13 2259  WBC 7.3 6.4 5.8  NEUTROABS 4.1  --   --   HGB 13.2 13.7 13.0  HCT 40.1 41.8 39.8  MCV 88.3 89.3 90.7  PLT 286 363 326   Cardiac Enzymes: No results found for this basename: CKTOTAL, CKMB, CKMBINDEX, TROPONINI,  in the last 168 hours  BNP (last 3 results) No results found for this basename: PROBNP,  in the last 8760 hours CBG: No results found for this basename: GLUCAP,  in the last 168 hours  Radiological Exams on Admission: Dg Chest 2 View  11/22/2013   CLINICAL DATA:  Dehydration, upper respiratory symptoms  EXAM: CHEST  2 VIEW  COMPARISON:  11/17/2013; 08/04/2013  FINDINGS: Grossly unchanged cardiac silhouette and mediastinal contours. There is grossly unchanged mild diffuse slightly nodular thickening of the pulmonary interstitium. Perihilar heterogeneous opacities are grossly unchanged, right greater than left, likely atelectasis. No new discrete focal airspace opacities. No definite pleural effusion or pneumothorax. Grossly unchanged bones including long segment paraspinal cervical, thoracic and lumbar paraspinal fusion. Depending catheter tubing again overlies the medial aspect of the right side of the mediastinum.  IMPRESSION: Grossly unchanged perihilar atelectasis without acute cardiopulmonary disease.   Electronically Signed   By: [name] M.D.    On: 11/22/2013 16:39   Dg Abd 1 View  11/22/2013   CLINICAL DATA:  GT placement  EXAM: ABDOMEN - 1 VIEW  COMPARISON:  None.  FINDINGS: Contrast has been injected into the gastrostomy tube. The tip is in the body of the stomach. Contrast fills the stomach and proximal small bowel without evidence of extravasation.  IMPRESSION: Gastrostomy tube is positioned in the body of the stomach.   Electronically Signed   By: Maryclare Bean M.D.   On: 11/22/2013 18:50   Ct Abdomen Pelvis W Contrast  11/23/2013   CLINICAL DATA:  Clogged G-tube.  Vomiting.  EXAM: CT ABDOMEN AND PELVIS WITH CONTRAST  TECHNIQUE: Multidetector CT imaging of the abdomen and pelvis was performed using the standard protocol following bolus administration of intravenous contrast.  CONTRAST:  100 mL of Omnipaque 300 IV contrast  COMPARISON:  CT of the abdomen and pelvis performed 02/06/2010  FINDINGS: Right basilar airspace opacity likely reflects atelectasis, though mild aspiration might have a similar appearance, given clinical concern.  The liver and spleen are unremarkable in appearance. The gallbladder is within normal limits. The pancreas and adrenal glands are unremarkable.  The kidneys are unremarkable in appearance. There is no evidence of hydronephrosis. No renal or ureteral stones are seen, though evaluation for renal stones is limited given contrast within the renal calyces. No perinephric stranding is appreciated.  The patient's G-tube is seen ending at the antrum of the stomach; the balloon is slightly larger than typically seen, filled with approximately 24 mL of contrast. The stomach is largely decompressed, containing trace air and fluid.  No free fluid is identified. The small bowel is unremarkable in appearance. No acute vascular abnormalities are seen.  The appendix is filled with contrast and air, without evidence for appendicitis. Contrast is seen filling the colon. The colon is unremarkable in appearance.  The bladder is  decompressed. Marked bladder wall thickening may reflect chronic neurogenic bladder. The prostate is borderline normal in size. No inguinal lymphadenopathy is seen.  No acute osseous abnormalities are identified. Right convex thoracolumbar scoliosis is noted. Thoracolumbar spinal fusion rods are noted.  IMPRESSION: 1. G-tube noted  ending at the antrum of the stomach; the balloon is slightly larger than typically seen, filled with approximately 24 mL of contrast. The stomach is largely decompressed and grossly unremarkable in appearance. The G-tube is directed towards the pylorus. 2. Marked bladder wall thickening is thought to reflect chronic neurogenic bladder. 3. Right basilar airspace opacity likely reflects atelectasis, though mild aspiration might have a similar appearance, given clinical concern. 4. Right convex thoracolumbar scoliosis noted, with associated hardware.   Electronically Signed   By: [name] Balding M.D.   On: 11/23/2013 02:07     Assessment/Plan Principal Problem:   Dehydration Active Problems:   Generalized anxiety disorder   PALSY, INFANTILE CEREBRAL, QUADRIPLEGIC   CEREBRAL PALSY   DYSPHAGIA UNSPECIFIED   Decreased oral intake   1. Dehydration secondary to poor oral intake - patient at this time has been placed on D5 normal saline. All oral feeds and PEG tube feeds are on hold. Due to persistent poor oral intake and difficulty feeding through PEG tube I have ordered CT abdomen pelvis. Consultations gastroenterologist Dr. Olevia Perches in a.m. for further recommendations. I have held all oral medications and change to IV dosing. Zyprexa has been given a sublingual and this was communicated to the patient's mother. We'll check Depakote levels TSH and ammonia levels. For diazepam I have placed patient on when necessary IV Ativan for anxiety. 2. Possible aspiration pneumonia - patient was placed on Levaquin and prednisone by patient's pulmonologist for possible bronchitis. Patient has  already received a course of Tamiflu for possible influenza. At this time if patient's CAT scan shows any evidence of aspiration I will change antibiotics to Unasyn. 3. Anxiety disorder and depression - presently patient is on IV Ativan when necessary along with Zyprexa sublingual. 4. History of cerebral palsy.  I have reviewed patient's old charts labs and recent consults. I discussed with pharmacist regarding patient's medication dose conversion to IV.    Code Status: Full code.  Family Communication: [name].  Disposition Plan: Admit to inpatient.    KAKRAKANDY,ARSHAD N. [name] Pager [phone].  If 7PM-7AM, please contact night-coverage [email or web address] Password [facility] 11/23/2013, 4:35 AM

## 2013-11-23 NOTE — Progress Notes (Signed)
Agree with dietetic intern note except for the following changes: RD consulted for TF initiation and management after time of visit. Will initiate continuous TF instead of bolus due to risk of refeeding syndrome and pt's ability to only tolerate 10 ml of liquid via PEG PTA.  Initiate Osmolite 1.2 @ 20 ml/hr via PEG and increase by 10 ml every 8 hours to goal rate of 60 ml/hr. At goal rate, tube feeding regimen will provide 1728 kcal, 80 grams of protein, and 1181 ml of H2O.   Pryor Ochoa RD, LDN Inpatient Clinical Dietitian Pager: [phone] After Hours Pager: [phone]

## 2013-11-23 NOTE — Progress Notes (Signed)
Using the pancreatic enzymes protocol feeding tube unclogged and flushes easily

## 2013-11-23 NOTE — Evaluation (Signed)
Clinical/Bedside Swallow Evaluation Patient Details  Name: [name] MRN: [id number] Date of Birth: [birth year]  Today's Date: 11/23/2013 Time: 1030-1100 SLP Time Calculation (min): 30 min  Past Medical History:  Past Medical History  Diagnosis Date  . Cerebral palsy   . GERD (gastroesophageal reflux disease)   . Anxiety   . Depression   . Thyroid disease     hyper  . Incontinence of feces   . Palpitations   . Esophagitis   . Dehydration 11/22/2013   Past Surgical History:  Past Surgical History  Procedure Laterality Date  . Spine surgery  ,11/20/2010, 2011  . Eye surgery    . Ears tubes    . Hamstring released    . Baclofen trial    . Baslofen pump implant    . Spinal fusion    . G-tube insert  August 2006  . Spinal fusioncorrect 106 degree kyphosis    . Spinal fusion to correct 70 degree kyphosis    . Tonsillectomy    . Peg placement  10/21/2011    Procedure: PERCUTANEOUS ENDOSCOPIC GASTROSTOMY (PEG) REPLACEMENT;  Surgeon: [name], MD;  Location: [facility];  Service: Endoscopy;  Laterality: N/A;  . Peg placement N/A 06/13/2013    Procedure: PERCUTANEOUS ENDOSCOPIC GASTROSTOMY (PEG) REPLACEMENT;  Surgeon: [name], MD;  Location: [facility];  Service: Endoscopy;  Laterality: N/A;   HPI:  [name] is a 28 y.o. male with history of cerebral palsy completely dependent on daily living activities, generalized anxiety disorder and depression has been having poor oral intake since January 11 of this month. Patient has been having productive cough since January 11 with wheezing and patient has been having poor oral intake with water the patient takes in even through PEG tube patient is to regurgitate. Patient had fever as per patient's mother with temperatures around 101F from January 11-15. Patient was taken to the ER and was placed on Tamiflu empirically. Eventual results show that influenza PCR has been negative. Due to patient's persistent cough and  wheezing patient was taken to pulmonologist on January 19. Patient is placed on Levaquin and prednisone and was advised to stop oral feeds and feeding pump was arranged. Due to patient's poor oral intake patient was taken to the ER chest x-ray did not show any acute findings and x-ray abdomen shows G-tube in place. Due to patient's dehydration and poor oral intake patient has been on med for further management. As per patient's mother patient did not vomit and did not have any diarrhea. He has not had good bowel movements for last one week. BSE indicated to assess risk for aspiration and recommend safest, PO diet.  MBS 03/05/2009, 05/19/2011, 06/03/11, 12/02/11, and 08/06/13.  Results of last MBS indicates severe baseline oral dysphagia with moderate pharyngeal dysphagia with silent penetration and silent aspiration with all liquid consistencies.  Diet recommendations NPO vs. Comfort feeds with puree consistency and thin liquids by straw with known risk.    Assessment / Plan / Recommendation Clinical Impression  No PO's attempted due to current respiratory status with patient presenting with baseline wet, congested cough.  Family member present reports patient taking PO's at home- mostly puree with no liquids.   Caregiver present reports swallow function had improved s/p last MBS completed 08/06/13 but "not sure"if patient had received dysphagia therapy.  Attempt x2 to contact patient's mother by phone for [name] but not contacted.  Recommend at this time continued NPO status with nutrition/hydration via PEG  as tolerated mainly due to current respiratory status.  Recommend oral care QID with use of oral suction.  ST to f/u on 11/24/13 for POC.  Will continue attempts to contact POA.        Aspiration Risk  Severe    Diet Recommendation NPO;Alternative means - long-term   Medication Administration: Via alternative means    Other  Recommendations Oral Care Recommendations: Oral care Q4 per protocol   Follow Up  Recommendations   (TBD)    Frequency and Duration min 2x/week  2 weeks       SLP Swallow Goals Please refer to Care Plans  Swallow Study Prior Functional Status  Per family member patient tolerating puree consistency.  Liquids not administered     General Date of Onset: 11/22/13 HPI: [name] is a 28 y.o. male with history of cerebral palsy completely dependent on daily living activities, generalized anxiety disorder and depression has been having poor oral intake since January 11 of this month. Patient has been having productive cough since January 11 with wheezing and patient has been having poor oral intake with water the patient takes in even through PEG tube patient is to regurgitate. Patient had fever as per patient's mother with temperatures around 101F from January 11-15. Patient was taken to the ER and was placed on Tamiflu empirically. Eventual results show that influenza PCR has been negative. Due to patient's persistent cough and wheezing patient was taken to pulmonologist on January 19. Patient is placed on Levaquin and prednisone and was advised to stop oral feeds and feeding pump was arranged. Due to patient's poor oral intake patient was taken to the ER chest x-ray did not show any acute findings and x-ray abdomen shows G-tube in place. Due to patient's dehydration and poor oral intake patient has been on med for further management. As per patient's mother patient did not vomit and did not have any diarrhea. He has not had good bowel movements for last one week. Type of Study: Bedside swallow evaluation Previous Swallow Assessment: MBS 08/2013 NPO vs. administering puree/thin liquids with known risk  Diet Prior to this Study: NPO Respiratory Status: Room air History of Recent Intubation: No Behavior/Cognition: Alert;Cooperative;Pleasant mood Oral Cavity - Dentition: Poor condition Baseline Vocal Quality: Wet Volitional Cough: Cognitively unable to elicit Volitional  Swallow: Unable to elicit    Oral/Motor/Sensory Function Overall Oral Motor/Sensory Function: Impaired at baseline   Ice Chips Ice chips: Not tested   Thin Liquid Thin Liquid: Not tested    Nectar Thick Nectar Thick Liquid: Not tested   Honey Thick Honey Thick Liquid: Not tested   Puree Puree: Not tested   Solid   GO    Solid: Not tested      Sharman Crate [place], CCC-SLP [phone] [facility] 11/23/2013,12:49 PM

## 2013-11-24 ENCOUNTER — Ambulatory Visit: Payer: Managed Care, Other (non HMO) | Admitting: Family Medicine

## 2013-11-24 LAB — GLUCOSE, CAPILLARY
Glucose-Capillary: 100 mg/dL — ABNORMAL HIGH (ref 70–99)
Glucose-Capillary: 105 mg/dL — ABNORMAL HIGH (ref 70–99)
Glucose-Capillary: 121 mg/dL — ABNORMAL HIGH (ref 70–99)
Glucose-Capillary: 86 mg/dL (ref 70–99)

## 2013-11-24 LAB — BASIC METABOLIC PANEL
BUN: 7 mg/dL (ref 6–23)
CO2: 23 mEq/L (ref 19–32)
Calcium: 8.9 mg/dL (ref 8.4–10.5)
Chloride: 104 mEq/L (ref 96–112)
Creatinine, Ser: 0.51 mg/dL (ref 0.50–1.35)
GFR calc Af Amer: >90 mL/min (ref >90)
GFR calc non Af Amer: >90 mL/min (ref >90)
Glucose, Bld: 107 mg/dL — ABNORMAL HIGH (ref 70–99)
Potassium: 3.4 mEq/L — ABNORMAL LOW (ref 3.7–5.3)
Sodium: 143 mEq/L (ref 137–147)

## 2013-11-24 LAB — MAGNESIUM
Magnesium: 2.2 mg/dL (ref 1.5–2.5)
Magnesium: 2.4 mg/dL (ref 1.5–2.5)

## 2013-11-24 LAB — PHOSPHORUS
Phosphorus: 4.5 mg/dL (ref 2.3–4.6)
Phosphorus: 3.4 mg/dL (ref 2.3–4.6)

## 2013-11-24 MED ORDER — PANTOPRAZOLE SODIUM 40 MG PO PACK
40.0000 mg | PACK | Freq: Every day | ORAL | Status: DC
  Administered 2013-11-24 – 2013-11-27 (×4): 40 mg
  Filled 2013-11-24 (×4): qty 20

## 2013-11-24 MED ORDER — OSMOLITE 1.2 CAL PO LIQD
1000.0000 mL | ORAL | Status: DC
  Filled 2013-11-24: qty 1000

## 2013-11-24 NOTE — Progress Notes (Signed)
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     Past medical history-As per Problem list Chart reviewed as below- reviewed  Consultants:  none  Procedures:  CT abd pelvis 1/22  Antibiotics:  Levaquin  Unasyn 1/22   Subjective   Well Doing fair per mom who is at bedside Seems better. Patient thinks he is somewhat "full" from all the feeds Nursing requested to cut back rate 40-->20     Objective    Interim History: none  Telemetry: nsr   Objective: Filed Vitals:   11/23/13 1822 11/23/13 2018 11/24/13 0617 11/24/13 1323  BP:  112/65 107/62 115/76  Pulse:  80 68 83  Temp:  97.8 F (36.6 C) 98.2 F (36.8 C) 97.6 F (36.4 C)  TempSrc:  Axillary Oral Axillary  Resp:  18 15 18   Height:      Weight: 50.6 kg (111 lb 8.8 oz)  50.3 kg (110 lb 14.3 oz)   SpO2:  98% 90% 94%    Intake/Output Summary (Last 24 hours) at 11/24/13 1655 Last data filed at 11/24/13 1200  Gross per 24 hour  Intake 1205.08 ml  Output      0 ml  Net 1205.08 ml    Exam:  General: eomi, ncat, Body mass index is 20.28 kg/(m^2). Cardiovascular: s1 s2 no m/r/g Respiratory: clear, no TVR, TVF Abdomen: soft, PEG tube area clean Skin soft Neuro spasticity +.  Moves all 4 limbs  Data Reviewed: Basic Metabolic Panel:  Recent Labs Lab 11/17/13 1810 11/22/13 1600 11/22/13 2259 11/23/13 0343 11/24/13 0305  NA 142 142  --  140 143  K 3.8 3.8  --  3.9 3.4*  CL 99 100  --  101 104  CO2 28 28  --  23 23  GLUCOSE 87 94  --  83 107*  BUN 11 6  --  4* 7  CREATININE 0.60 0.50 0.49* 0.48* 0.51  CALCIUM 9.4 9.2  --  8.6 8.9  MG  --   --   --   --  2.2    PHOS  --   --   --   --  4.5   Liver Function Tests:  Recent Labs Lab 11/22/13 1600 11/23/13 0343  AST 25 24  ALT 19 16  ALKPHOS 65 61  BILITOT 0.3 0.4  PROT 7.4 6.5  ALBUMIN 3.9 3.4*   No results found for this basename: LIPASE, AMYLASE,  in the last 168 hours  Recent Labs Lab 11/23/13 0825  AMMONIA 49   CBC:  Recent Labs Lab 11/17/13 1810 11/22/13 1600 11/22/13 2259 11/23/13 0343  WBC 7.3 6.4 5.8 6.3  NEUTROABS 4.1  --   --  2.9  HGB 13.2 13.7 13.0 13.3  HCT 40.1 41.8 39.8 41.0  MCV 88.3 89.3 90.7 90.5  PLT 286 363 326 336   Cardiac Enzymes: No results found for this basename: CKTOTAL, CKMB, CKMBINDEX, TROPONINI,  in the last 168 hours BNP: No components found with this basename: POCBNP,  CBG:  Recent Labs Lab 11/23/13 1225 11/23/13 1738 11/23/13 2355 11/24/13 0610 11/24/13 1124  GLUCAP 98 97 103* 121* 100*    No results found for this or any previous visit (from the  past 240 hour(s)).   Studies:              All Imaging reviewed and is as per above notation   Scheduled Meds: . ampicillin-sulbactam (UNASYN) IV  1.5 g Intravenous Q6H  . divalproex  250 mg Oral Daily  . divalproex  500 mg Oral QHS  . enoxaparin (LOVENOX) injection  40 mg Subcutaneous Q24H  . feeding supplement (OSMOLITE 1.2 CAL)  1,000 mL Per Tube Q24H  . OLANZapine zydis  5 mg Oral QHS  . pantoprazole sodium  40 mg Per Tube Daily  . PARoxetine  30 mg Oral QHS  . polyethylene glycol  17 g Per Tube QPM   Continuous Infusions: . feeding supplement (OSMOLITE 1.2 CAL) 1,000 mL (11/24/13 1000)     Assessment/Plan: 1. Likely aspiration pneumonia-continue Unasyn, appreciate speech therapy input, will keep n.p.o.  D/c steroids. Severe dysphagia-n.p.o. status, appreciate nutrition input.  Cut back feeds to 20 cc/hr-At risk for refeeding ? check phos and mag. 2. 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 Spastic cerebral palsy-chronic 4. S/p multiple  surgeries   Code Status: Full Family Communication:  Discussed with Mom who understands aspiration can recur Disposition Plan:  inpatient   Verneita Griffes, MD  [name] Pager [phone] 11/24/2013, 4:55 PM    LOS: 2 days

## 2013-11-24 NOTE — Progress Notes (Signed)
NUTRITION FOLLOW UP  Intervention:   Continue Osmolite 1.2 @ 40 ml/hr via PEG and continue to increase by 10 ml every 8 hours to goal rate of 60 ml/hr. At goal rate, tube feeding regimen will provide 1728 kcal, 80 grams of protein, and 1181 ml of H2O.  RD to continue to monitor Diet advancement per SLP/MD discretion  Nutrition Dx:   Inadequate oral intake related to inability to eat as evidenced by NPO status; ongoing  Goal:   Pt to meet >/= 90% of their estimated nutrition needs; progressing  Monitor:   PO diet advancement, EN tolerance, weight rends, labs, I/O's  Assessment:   Pt denies nausea with TF. TF currently running at 40 ml/hr and advancing slowly. Per RN, pt has not had any residuals today; pt has had some coughing but, he is not coughing up any TF's.  Magnesium and phosphorus are WNL; potassium decreased  Last BM 1/23  Height: Ht Readings from Last 1 Encounters:  11/22/13 5\' 2"  (1.575 m)    Weight Status:   Wt Readings from Last 1 Encounters:  11/24/13 110 lb 14.3 oz (50.3 kg)    Re-estimated needs:  Kcal: 1550-1750  Protein: 75-85 grams  Fluid: 1.5-1.7 L  Skin: intact  Diet Order: NPO   Intake/Output Summary (Last 24 hours) at 11/24/13 1238 Last data filed at 11/24/13 1200  Gross per 24 hour  Intake 1205.08 ml  Output    650 ml  Net 555.08 ml    Last BM: 1/23   Labs:   Recent Labs Lab 11/22/13 1600 11/22/13 2259 11/23/13 0343 11/24/13 0305  NA 142  --  140 143  K 3.8  --  3.9 3.4*  CL 100  --  101 104  CO2 28  --  23 23  BUN 6  --  4* 7  CREATININE 0.50 0.49* 0.48* 0.51  CALCIUM 9.2  --  8.6 8.9  MG  --   --   --  2.2  PHOS  --   --   --  4.5  GLUCOSE 94  --  83 107*    CBG (last 3)   Recent Labs  11/23/13 2355 11/24/13 0610 11/24/13 1124  GLUCAP 103* 121* 100*    Scheduled Meds: . ampicillin-sulbactam (UNASYN) IV  1.5 g Intravenous Q6H  . divalproex  250 mg Oral Daily  . divalproex  500 mg Oral QHS  . enoxaparin  (LOVENOX) injection  40 mg Subcutaneous Q24H  . OLANZapine zydis  5 mg Oral QHS  . pantoprazole sodium  40 mg Per Tube Daily  . PARoxetine  30 mg Oral QHS  . polyethylene glycol  17 g Per Tube QPM    Continuous Infusions: . feeding supplement (OSMOLITE 1.2 CAL) 1,000 mL (11/24/13 1000)    Pryor Ochoa RD, LDN Inpatient Clinical Dietitian Pager: [phone] After Hours Pager: [phone]

## 2013-11-25 LAB — COMPREHENSIVE METABOLIC PANEL
ALT: 13 U/L (ref 0–53)
AST: 17 U/L (ref 0–37)
Albumin: 3.6 g/dL (ref 3.5–5.2)
Alkaline Phosphatase: 58 U/L (ref 39–117)
BUN: 10 mg/dL (ref 6–23)
CO2: 26 mEq/L (ref 19–32)
Calcium: 9.2 mg/dL (ref 8.4–10.5)
Chloride: 107 mEq/L (ref 96–112)
Creatinine, Ser: 0.53 mg/dL (ref 0.50–1.35)
GFR calc Af Amer: >90 mL/min (ref >90)
GFR calc non Af Amer: >90 mL/min (ref >90)
Glucose, Bld: 113 mg/dL — ABNORMAL HIGH (ref 70–99)
Potassium: 4.1 mEq/L (ref 3.7–5.3)
Sodium: 146 mEq/L (ref 137–147)
Total Bilirubin: 0.3 mg/dL (ref 0.3–1.2)
Total Protein: 6.9 g/dL (ref 6.0–8.3)

## 2013-11-25 LAB — GLUCOSE, CAPILLARY
Glucose-Capillary: 101 mg/dL — ABNORMAL HIGH (ref 70–99)
Glucose-Capillary: 95 mg/dL (ref 70–99)

## 2013-11-25 LAB — MAGNESIUM: Magnesium: 2.4 mg/dL (ref 1.5–2.5)

## 2013-11-25 MED ORDER — OSMOLITE 1.2 CAL PO LIQD
1000.0000 mL | ORAL | Status: DC
  Administered 2013-11-27: 1000 mL
  Filled 2013-11-25 (×2): qty 1000

## 2013-11-25 MED ORDER — ZOLPIDEM TARTRATE 5 MG PO TABS
5.0000 mg | ORAL_TABLET | Freq: Every evening | ORAL | Status: DC | PRN
  Administered 2013-11-26: 5 mg via ORAL
  Filled 2013-11-25: qty 1

## 2013-11-25 MED ORDER — AMOXICILLIN-POT CLAVULANATE 875-125 MG PO TABS
1.0000 | ORAL_TABLET | Freq: Two times a day (BID) | ORAL | Status: DC
  Administered 2013-11-25 – 2013-11-27 (×4): 1 via ORAL
  Filled 2013-11-25 (×6): qty 1

## 2013-11-25 MED ORDER — DIAZEPAM 2 MG PO TABS: 2.0000 mg | ORAL_TABLET | Freq: Every evening | ORAL | Status: DC | PRN

## 2013-11-25 NOTE — Progress Notes (Signed)
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     Past medical history-As per Problem list Chart reviewed as below- reviewed  Consultants:  none  Procedures:  CT abd pelvis 1/22  Antibiotics:  Levaquin  Unasyn 1/22   Subjective   Well Doing vbetter Wants ice cream Doesn't feel as "full" from feeds today     Objective    Interim History: none  Telemetry: nsr   Objective: Filed Vitals:   11/24/13 0617 11/24/13 1323 11/24/13 2200 11/25/13 0622  BP: 107/62 115/76 122/85 126/63  Pulse: 68 83 85 97  Temp: 98.2 F (36.8 C) 97.6 F (36.4 C) 98.6 F (37 C) 98.4 F (36.9 C)  TempSrc: Oral Axillary Oral Oral  Resp: 15 18 18 18   Height:      Weight: 50.3 kg (110 lb 14.3 oz)   50.5 kg (111 lb 5.3 oz)  SpO2: 90% 94% 96% 94%    Intake/Output Summary (Last 24 hours) at 11/25/13 1451 Last data filed at 11/25/13 0500  Gross per 24 hour  Intake    420 ml  Output      0 ml  Net    420 ml    Exam:  General: eomi, ncat, Body mass index is 20.36 kg/(m^2). Cardiovascular: s1 s2 no m/r/g Respiratory: clear, no TVR, TVF Abdomen: soft, PEG tube area clean Skin soft Neuro spasticity +.  Moves all 4 limbs  Data Reviewed: Basic Metabolic Panel:  Recent Labs Lab 11/22/13 1600 11/22/13 2259 11/23/13 0343 11/24/13 0305 11/24/13 1915 11/25/13 0500  NA 142  --  140 143  --  146  K 3.8  --  3.9 3.4*  --  4.1  CL 100  --  101 104  --  107  CO2 28  --  23 23  --  26  GLUCOSE 94  --  83 107*  --  113*  BUN 6  --  4* 7  --  10  CREATININE 0.50 0.49* 0.48* 0.51  --  0.53  CALCIUM 9.2  --  8.6 8.9  --  9.2  MG  --   --   --  2.2 2.4   --   PHOS  --   --   --  4.5 3.4  --    Liver Function Tests:  Recent Labs Lab 11/22/13 1600 11/23/13 0343 11/25/13 0500  AST 25 24 17   ALT 19 16 13   ALKPHOS 65 61 58  BILITOT 0.3 0.4 0.3  PROT 7.4 6.5 6.9  ALBUMIN 3.9 3.4* 3.6   No results found for this basename: LIPASE, AMYLASE,  in the last 168 hours  Recent Labs Lab 11/23/13 0825  AMMONIA 49   CBC:  Recent Labs Lab 11/22/13 1600 11/22/13 2259 11/23/13 0343  WBC 6.4 5.8 6.3  NEUTROABS  --   --  2.9  HGB 13.7 13.0 13.3  HCT 41.8 39.8 41.0  MCV 89.3 90.7 90.5  PLT 363 326 336   Cardiac Enzymes: No results found for this basename: CKTOTAL, CKMB, CKMBINDEX, TROPONINI,  in the last 168 hours BNP: No components found with this basename: POCBNP,  CBG:  Recent Labs Lab 11/24/13 0610 11/24/13 1124 11/24/13 1807 11/24/13 2351 11/25/13 1142  GLUCAP 121* 100* 105* 86 101*    No results found for this or any previous visit (from the past 240 hour(s)).   Studies:              All Imaging reviewed and is as per above notation   Scheduled Meds: . amoxicillin-clavulanate  1 tablet Oral Q12H  . divalproex  250 mg Oral Daily  . divalproex  500 mg Oral QHS  . enoxaparin (LOVENOX) injection  40 mg Subcutaneous Q24H  . feeding supplement (OSMOLITE 1.2 CAL)  1,000 mL Per Tube Q24H  . OLANZapine zydis  5 mg Oral QHS  . pantoprazole sodium  40 mg Per Tube Daily  . PARoxetine  30 mg Oral QHS  . polyethylene glycol  17 g Per Tube QPM   Continuous Infusions:     Assessment/Plan: 1. Likely aspiration pneumonia-continue Unasyn, appreciate speech therapy input, will keep n.p.o.  D/c steroids. Severe dysphagia-n.p.o. status, appreciate nutrition input.  Increased feeds to 40 cc/hr-At risk for refeeding ? check phos and mag. 2. 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 Spastic cerebral palsy-chronic 4. S/p multiple surgeries   Code Status: Full Family Communication:  Discussed with dad at  bedside Disposition Plan:  inpatient  >4min    Verneita Griffes, MD  [name] Pager [phone] 11/25/2013, 2:51 PM    LOS: 3 days

## 2013-11-25 NOTE — Progress Notes (Signed)
Speech Language Pathology Treatment: Dysphagia  Patient Details Name: [name] MRN: [id number] DOB: [birth year] Today's Date: 11/25/2013 Time: 9678-9381 SLP Time Calculation (min): 35 min  Assessment / Plan / Recommendation Clinical Impression  Reviewed past MBS result and treatment notes.  MD ordered for further recommendations prior to discharge.  Dad stated prior to admission pt. did not drink (liquids via PEG) and consumed yogurt, puddings, ice cream and sometimes mashed up meatloaf and meatballs).  SLP observed pt. with ice cream (pt. Request) resulting in what appeared to be significant aspiration episode with first bite including numerous swallows (1/2 tsp ice cream), red face, coughing and gasping simultaneously.  SLP encouraged him to continue coughing and swallowing and he recovered after a minute.  Two additional trials were administered with decreased coughing and multiple swallows and likely aspiration as well.  SLP educated pt. and dad on recommendation to continue NPO with PEG feedings explaining pt's endurance and reserve have decreased with current illness and inability to tolerate aspiration (little sleep past week).  Educated to slowly initiate purees at home when strength, endurance, sleep pattern return to baseline (with known aspiration risks).  SLP will sign off at this time.  Please reconsult if needed.   HPI HPI: [name] is a 28 y.o. male with history of cerebral palsy completely dependent on daily living activities, generalized anxiety disorder and depression has been having poor oral intake since January 11 of this month. Patient has been having productive cough since January 11 with wheezing and patient has been having poor oral intake with water the patient takes in even through PEG tube patient is to regurgitate. Patient had fever as per patient's mother with temperatures around 101F from January 11-15. Patient was taken to the ER and was placed on Tamiflu  empirically. Eventual results show that influenza PCR has been negative. Due to patient's persistent cough and wheezing patient was taken to pulmonologist on January 19. Patient is placed on Levaquin and prednisone and was advised to stop oral feeds and feeding pump was arranged. Due to patient's poor oral intake patient was taken to the ER chest x-ray did not show any acute findings and x-ray abdomen shows G-tube in place. Due to patient's dehydration and poor oral intake patient has been on med for further management. As per patient's mother patient did not vomit and did not have any diarrhea. He has not had good bowel movements for last one week  SLP received order for further recommendations prior to discharge.   Pertinent Vitals WDL  SLP Plan  Discharge SLP treatment due to (comment) (see impression statement)    Recommendations Diet recommendations: NPO              Oral Care Recommendations: Oral care Q4 per protocol Follow up Recommendations: None Plan: Discharge SLP treatment due to (comment) (see impression statement)    GO     Cranford Mon.Ed [employer] [phone]  11/25/2013

## 2013-11-25 NOTE — Progress Notes (Signed)
ANTIBIOTIC CONSULT NOTE - FOLLOW UP  Pharmacy Consult for unasyn Indication: rule out pneumonia  Allergies  Allergen Reactions  . Sulfonamide Derivatives Rash    Patient Measurements: Height: 5\' 2"  (157.5 cm) Weight: 111 lb 5.3 oz (50.5 kg) IBW/kg (Calculated) : 54.6 Adjusted Body Weight:   Vital Signs: Temp: 98.4 F (36.9 C) (01/24 0622) Temp src: Oral (01/24 0622) BP: 126/63 mmHg (01/24 0622) Pulse Rate: 97 (01/24 0622) Intake/Output from previous day: 01/23 0701 - 01/24 0700 In: 730 [NG/GT:580; IV Piggyback:150] Out: -  Intake/Output from this shift:    Labs:  Recent Labs  11/22/13 1600 11/22/13 2259 11/23/13 0343 11/24/13 0305 11/25/13 0500  WBC 6.4 5.8 6.3  --   --   HGB 13.7 13.0 13.3  --   --   PLT 363 326 336  --   --   CREATININE 0.50 0.49* 0.48* 0.51 0.53   Estimated Creatinine Clearance: 97.3 ml/min (by C-G formula based on Cr of 0.53). No results found for this basename: VANCOTROUGH, VANCOPEAK, VANCORANDOM, GENTTROUGH, GENTPEAK, GENTRANDOM, TOBRATROUGH, TOBRAPEAK, TOBRARND, AMIKACINPEAK, AMIKACINTROU, AMIKACIN,  in the last 72 hours   Microbiology: No results found for this or any previous visit (from the past 720 hour(s)).  Anti-infectives   Start     Dose/Rate Route Frequency Ordered Stop   11/23/13 0600  ampicillin-sulbactam (UNASYN) 1.5 g in sodium chloride 0.9 % 50 mL IVPB     1.5 g 100 mL/hr over 30 Minutes Intravenous Every 6 hours 11/23/13 0434     11/23/13 0000  levofloxacin (LEVAQUIN) IVPB 500 mg  Status:  Discontinued     500 mg 100 mL/hr over 60 Minutes Intravenous Every 24 hours 11/22/13 2316 11/23/13 0435      Assessment: 5 yom presented to the hospital with cough and fever. Started on unasyn for possible aspiration pneumonia. Today is D#4 of therapy. No cultures done. Dose remains appropriate. Pt is afebrile and last WBC on 1/22 was WNL. Renal fxn is stable.   Unasyn 1/21>> Levaquin x 1 1/21  Goal of Therapy:  Eradication  of infection  Plan:  1. Continue unasyn 1.5gm IV Q6H 2. F/u renal fxn, C&S, clinical status and trough at SS 3. Consider changing to PO antibiotic such as augmentin to complete course of therapy  Kilea Mccarey, Rande Lawman 11/25/2013,8:20 AM

## 2013-11-26 LAB — CBC WITH DIFFERENTIAL/PLATELET
Basophils Absolute: 0 10*3/uL (ref 0.0–0.1)
Basophils Relative: 0 % (ref 0–1)
Eosinophils Absolute: 0.1 10*3/uL (ref 0.0–0.7)
Eosinophils Relative: 1 % (ref 0–5)
HCT: 43 % (ref 39.0–52.0)
Hemoglobin: 14 g/dL (ref 13.0–17.0)
Lymphocytes Relative: 58 % — ABNORMAL HIGH (ref 12–46)
Lymphs Abs: 3.3 10*3/uL (ref 0.7–4.0)
MCH: 29.4 pg (ref 26.0–34.0)
MCHC: 32.6 g/dL (ref 30.0–36.0)
MCV: 90.3 fL (ref 78.0–100.0)
Monocytes Absolute: 0.4 10*3/uL (ref 0.1–1.0)
Monocytes Relative: 7 % (ref 3–12)
Neutro Abs: 2 10*3/uL (ref 1.7–7.7)
Neutrophils Relative %: 35 % — ABNORMAL LOW (ref 43–77)
Platelets: 305 10*3/uL (ref 150–400)
RBC: 4.76 MIL/uL (ref 4.22–5.81)
RDW: 14.5 % (ref 11.5–15.5)
WBC: 5.7 10*3/uL (ref 4.0–10.5)

## 2013-11-26 LAB — COMPREHENSIVE METABOLIC PANEL
ALT: 16 U/L (ref 0–53)
AST: 20 U/L (ref 0–37)
Albumin: 3.6 g/dL (ref 3.5–5.2)
Alkaline Phosphatase: 56 U/L (ref 39–117)
BUN: 12 mg/dL (ref 6–23)
CO2: 23 mEq/L (ref 19–32)
Calcium: 9 mg/dL (ref 8.4–10.5)
Chloride: 105 mEq/L (ref 96–112)
Creatinine, Ser: 0.51 mg/dL (ref 0.50–1.35)
GFR calc Af Amer: >90 mL/min (ref >90)
GFR calc non Af Amer: >90 mL/min (ref >90)
Glucose, Bld: 108 mg/dL — ABNORMAL HIGH (ref 70–99)
Potassium: 4.2 mEq/L (ref 3.7–5.3)
Sodium: 144 mEq/L (ref 137–147)
Total Bilirubin: 0.3 mg/dL (ref 0.3–1.2)
Total Protein: 7 g/dL (ref 6.0–8.3)

## 2013-11-26 LAB — PHOSPHORUS: Phosphorus: 4.4 mg/dL (ref 2.3–4.6)

## 2013-11-26 LAB — GLUCOSE, CAPILLARY
Glucose-Capillary: 102 mg/dL — ABNORMAL HIGH (ref 70–99)
Glucose-Capillary: 92 mg/dL (ref 70–99)
Glucose-Capillary: 95 mg/dL (ref 70–99)
Glucose-Capillary: 99 mg/dL (ref 70–99)

## 2013-11-26 MED ORDER — BIOTENE DRY MOUTH MT LIQD
15.0000 mL | Freq: Two times a day (BID) | OROMUCOSAL | Status: DC
  Administered 2013-11-26 (×2): 15 mL via OROMUCOSAL

## 2013-11-26 MED ORDER — CHLORHEXIDINE GLUCONATE 0.12 % MT SOLN
15.0000 mL | Freq: Two times a day (BID) | OROMUCOSAL | Status: DC
  Administered 2013-11-27 (×2): 15 mL via OROMUCOSAL
  Filled 2013-11-26: qty 15

## 2013-11-26 NOTE — Progress Notes (Signed)
Pt tolerating 40cc of tube feeding Osmolite 1.2 without any residual.  Pt stated x1 that he felt full and so I stopped the tube feeding for 1/2 hr and then restarted it.  Pt's mom and case manager, Mariane Masters have talked about DC plans.  Faxed info to Mariane Masters from mom concerning their insurance company and pt needs.  Case manager will follow up tomorrow to assist in getting everything ready for DC home.

## 2013-11-26 NOTE — Progress Notes (Signed)
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     Past medical history-As per Problem list Chart reviewed as below- reviewed  Consultants:  none  Procedures:  CT abd pelvis 1/22  Antibiotics:  Levaquin  Unasyn 1/22   Subjective   Well Slept better last night.  Feels full again   Objective    Interim History: Speech therapy 1/24 SLP educated pt. and dad on recommendation to continue NPO with PEG feedings explaining pt's endurance and reserve have decreased with current illness and inability to tolerate aspiration (little sleep past week). Educated to slowly initiate purees at home when strength, endurance, sleep pattern return to baseline (with known aspiration risks). SLP will sign off at this time.    Telemetry: nsr   Objective: Filed Vitals:   11/24/13 2200 11/25/13 0622 11/25/13 1430 11/26/13 0620  BP: 122/85 126/63 132/65 103/52  Pulse: 85 97 87 76  Temp: 98.6 F (37 C) 98.4 F (36.9 C) 97.2 F (36.2 C) 97.5 F (36.4 C)  TempSrc: Oral Oral Oral Oral  Resp: 18 18 20 20   Height:      Weight:  50.5 kg (111 lb 5.3 oz)  47.129 kg (103 lb 14.4 oz)  SpO2: 96% 94% 93% 96%    Intake/Output Summary (Last 24 hours) at 11/26/13 1346 Last data filed at 11/26/13 0823  Gross per 24 hour  Intake 700.33 ml  Output      0 ml  Net 700.33 ml    Exam:  General: eomi, ncat, Body mass index is 19 kg/(m^2). Cardiovascular: s1 s2 no m/r/g Respiratory: clear, no TVR, TVF Abdomen: soft, PEG tube area clean Skin soft Neuro spasticity +.  Moves all 4 limbs  Data Reviewed: Basic Metabolic Panel:  Recent Labs Lab 11/22/13 1600  11/22/13 2259 11/23/13 0343 11/24/13 0305 11/24/13 1915 11/25/13 0500 11/26/13 0800  NA 142  --  140 143  --  146 144  K 3.8  --  3.9 3.4*  --  4.1 4.2  CL 100  --  101 104  --  107 105  CO2 28  --  23 23  --  26 23  GLUCOSE 94  --  83 107*  --  113* 108*  BUN 6  --  4* 7  --  10 12  CREATININE 0.50 0.49* 0.48* 0.51  --  0.53 0.51  CALCIUM 9.2  --  8.6 8.9  --  9.2 9.0  MG  --   --   --  2.2 2.4 2.4  --   PHOS  --   --   --  4.5 3.4  --  4.4   Liver Function Tests:  Recent Labs Lab 11/22/13 1600 11/23/13 0343 11/25/13 0500 11/26/13 0800  AST 25 24 17 20   ALT 19 16 13 16   ALKPHOS 65 61 58 56  BILITOT 0.3 0.4 0.3 0.3  PROT 7.4 6.5 6.9 7.0  ALBUMIN 3.9 3.4* 3.6 3.6   No results found for this basename: LIPASE, AMYLASE,  in the last 168 hours  Recent Labs Lab 11/23/13 0825  AMMONIA 49   CBC:  Recent Labs Lab 11/22/13 1600 11/22/13 2259 11/23/13 0343 11/26/13 0800  WBC 6.4 5.8 6.3 5.7  NEUTROABS  --   --  2.9 2.0  HGB 13.7 13.0 13.3 14.0  HCT 41.8 39.8 41.0 43.0  MCV 89.3 90.7 90.5 90.3  PLT 363 326 336 305   Cardiac Enzymes: No results found for this basename: CKTOTAL, CKMB, CKMBINDEX, TROPONINI,  in the last 168 hours BNP: No components found with this basename: POCBNP,  CBG:  Recent Labs Lab 11/25/13 1142 11/25/13 1915 11/26/13 0035 11/26/13 0617 11/26/13 1152  GLUCAP 101* 95 99 102* 95    No results found for this or any previous visit (from the past 240 hour(s)).   Studies:              All Imaging reviewed and is as per above notation   Scheduled Meds: . amoxicillin-clavulanate  1 tablet Oral Q12H  . antiseptic oral rinse  15 mL Mouth Rinse q12n4p  . chlorhexidine  15 mL Mouth Rinse BID  . divalproex  250 mg Oral Daily  . divalproex  500 mg Oral QHS  . enoxaparin (LOVENOX) injection  40 mg Subcutaneous Q24H  . feeding supplement (OSMOLITE 1.2 CAL)  1,000 mL Per Tube Q24H  . OLANZapine zydis  5 mg Oral QHS  . pantoprazole sodium  40  mg Per Tube Daily  . PARoxetine  30 mg Oral QHS  . polyethylene glycol  17 g Per Tube QPM   Continuous Infusions:     Assessment/Plan: 1. Likely aspiration pneumonia-transitined Unasyn to PO augmentin 1/25.  Keep NPO as per speech.  CM consulted to get Feeds arranged for home and is aware Severe dysphagia-n.p.o. status, appreciate nutrition input.  Increased feeds to 40 cc/hr-At risk for refeeding ? check phos and mag. 2. 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 Spastic cerebral palsy-chronic-continue BZD.  Have explained that these ar neurotoxic but might be a necessity for patent' spasm.  Patient supposed to get Botox by Dr. Tessa Lerner soon. 4. S/p multiple surgeries   Code Status: Full Family Communication:  Discussed with dad at bedside  Disposition Plan:  inpatient  20 min   Verneita Griffes, MD  [name] Pager [phone] 11/26/2013, 1:46 PM    LOS: 4 days

## 2013-11-26 NOTE — Progress Notes (Signed)
   CARE MANAGEMENT NOTE 11/26/2013  Patient:  [name], [name]   Account Number:  [id number]  Date Initiated:  11/26/2013  Documentation initiated by:  [facility]  Subjective/Objective Assessment:   adm: Sent by PCP for conerns of dehdyration     Action/Plan:   discharge planning   Anticipated DC Date:  11/27/2013   Anticipated DC Plan:  [employer]  CM consult      Choice offered to / List presented to:             Status of service:  In process, will continue to follow Medicare Important Message given?   (If response is "NO", the following Medicare IM given date fields will be blank) Date Medicare IM given:   Date Additional Medicare IM given:    Discharge Disposition:    Per UR Regulation:    If discussed at Long Length of Stay Meetings, dates discussed:    Comments:  11/26/13 13:10 CM met with Marya Fossa, mother and caretaker of pt in room and had a lengthy discussion concerning her frustrations with [employer].  [employer] is responsible for pump but is closed on weekends: [phone].  [employer] was to deliver to home the suction machine but linda states it was not delivered to home bc [name] has not given them a credit card number for their files.  Both DME devices need to be in place prior to pt going home. Katherine Basset is Nurse Case Mgr with [employer]: [place]. This CM will discuss this pt's needs with weekday CM 11/27/13 who will continue to follow.  Mariane Masters, BSN, CM [phone].

## 2013-11-27 LAB — GLUCOSE, CAPILLARY
Glucose-Capillary: 102 mg/dL — ABNORMAL HIGH (ref 70–99)
Glucose-Capillary: 110 mg/dL — ABNORMAL HIGH (ref 70–99)
Glucose-Capillary: 111 mg/dL — ABNORMAL HIGH (ref 70–99)
Glucose-Capillary: 89 mg/dL (ref 70–99)

## 2013-11-27 MED ORDER — KANGAROO EPUMP SET 1000ML MISC: 1.0000 [IU] | Status: DC

## 2013-11-27 MED ORDER — OSMOLITE 1.2 CAL PO LIQD: 1000.0000 mL | ORAL | Status: DC

## 2013-11-27 MED ORDER — AMBULATORY NON FORMULARY MEDICATION: Status: DC

## 2013-11-27 MED ORDER — AMOXICILLIN-POT CLAVULANATE 875-125 MG PO TABS: 1.0000 | ORAL_TABLET | Freq: Two times a day (BID) | ORAL | Status: DC

## 2013-11-27 MED ORDER — FEEDING TUBES - PUMP MISC: 1.0000 [IU] | Status: DC

## 2013-11-27 MED ORDER — FLEXIFLO FEEDING BAG/PUMP MISC: 1.0000 [IU] | Status: DC

## 2013-11-27 NOTE — Progress Notes (Signed)
Pt discharged per written orders. Discharge instructions/info/follow up care/home care discussed with [name], patient's mother. Case Manager Nira Conn helped arrange home feeding pump/tube feed supplies and equipment. [name] was given all prescriptions. Time allowed for questions and concerns. [name] stated she had none at this time.  I assisted Linda and [name] (pt) to the front main entrance.  Soyla Dryer, RN

## 2013-11-27 NOTE — Discharge Summary (Signed)
Physician Discharge Summary  [name] [id number] DOB: May 04, 1986 DOA: 11/22/2013  PCP: [name], MD  Admit date: 11/22/2013 Discharge date: 11/27/2013  Time spent: 40 minutes  Recommendations for Outpatient Follow-up:  1. Needs Feeding pump and supplies [ordered] until he can safely be placed back on an oral diet 2. 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  Found ultimately to have aspiration PNa  Hospital Course: 1. Aspiration pneumonia-transitined Unasyn to PO augmentin 1/25. Keep NPO as per speech. CM consulted to get Feeds arranged for home and is aware-I have ordered all feeding supplies needed.  Case Manager aware.  Speech therapy and regular therapy ordered for home 2. Severe dysphagia-n.p.o. status, appreciate nutrition input. Increased feeds to 40 cc/hr-At risk for refeeding ? check phos and mag-this was persistently neg.  Stop checking labs 2. 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 Spastic cerebral palsy-chronic-continue BZD. Have explained that these ar neurotoxic but might be a necessity for patent' spasm. Patient supposed to get Botox by Dr. Tessa Lerner  soon. 4. S/p multiple surgeries 5. Grade 3 Urinary reflux-OP Urology input needed  Consultants:  none Procedures:  CT abd pelvis 1/22 Antibiotics:  Levaquin 1/21 Unasyn 1/22-->11/25/13 Augmentin 1/25-->12/05/13  Discharge Exam: Filed Vitals:   11/27/13 0611  BP: 105/70  Pulse: 75  Temp: 98 F (36.7 C)  Resp: 19   Stable NO other issues  General: EOMI, NCAT Cardiovascular:  s1 s2 no m/r/g Respiratory: clear  Discharge Instructions  Discharge Orders   Future Appointments Provider Department Dept Phone   12/08/2013 11:40 AM Meredith Staggers, MD [facility] [phone]   12/11/2013 2:30 PM Melvenia Needles, NP [facility] [phone]   Future Orders Complete By Expires   Diet - low sodium heart healthy  As directed    Discharge instructions  As directed    Comments:     Finish Augmentin 12/05/13 See new PCP soon Follow with Urology as OP for grade 3 reflux NPO until speech therapy sees him at home.  Will set this up   Face-to-face encounter (required for Medicare/Medicaid patients)  As directed    Comments:     I [name], [facility] certify that this patient is under my care and that I, or a nurse practitioner or physician's assistant working with me, had a face-to-face encounter that meets the physician face-to-face encounter requirements with this patient on 11/27/2013. The encounter with the patient was in whole, or in part for the following medical condition(s) which is the primary reason for home health care (List medical condition):   NEEDS SLP INPUT FOR FEEDS/GRADUATION OF DIET AND SKILLED CARE AT HOME   Questions:     The encounter with the patient was in whole, or  in part, for the following medical condition, which is the primary reason for home health care:  [place]   I certify that, based on my findings, the following services are medically necessary home health services:  Speech language pathology    Physical therapy   Nursing   My clinical findings support the need for the above services:  Shortness of breath with activity   Further, I certify that my clinical findings support that this patient is homebound due to:  Mental confusion   Reason for Medically Necessary Home Health Services:  [place]  As directed    Questions:     To provide the following care/treatments:  SLP   OT   PT   Home Health Aide   Increase activity slowly  As directed        Medication List    STOP taking these medications       levofloxacin 25 MG/ML solution  Commonly known as:  LEVAQUIN     predniSONE 5 MG/5ML solution      TAKE these medications       ADVANCED PROBIOTIC 10 Caps  Give 1 capsule by tube daily.     AMBULATORY NON FORMULARY MEDICATION  - Medication Name: MIC gastrostomy/bolus feeding tube 24 French Part number 0110-24. #2 and  - 10 cc lurer lock syringe #2  - Dx:     amoxicillin-clavulanate 875-125 MG per tablet  Commonly known as:  AUGMENTIN  Take 1 tablet by mouth every 12 (twelve) hours.     diazepam 5 MG tablet  Commonly known as:  VALIUM  Take 5 mg by mouth 3 (three) times daily as needed for anxiety.     divalproex 125 MG capsule  Commonly known as:  DEPAKOTE SPRINKLE  Take 250-500 mg by mouth 2 (two) times daily. Takes 250mg  in the morning and 500mg  in the evening     feeding supplement (OSMOLITE 1.2 CAL) Liqd  Place 1,000 mLs into feeding tube daily.     Feeding Tubes - Pump Misc  1 Units by Does not apply route continuous.     FLEXIFLO FEEDING BAG/PUMP Misc  1 Units by Does not apply route continuous.     HYDROcodone-homatropine 5-1.5 MG/5ML syrup  Commonly known as:  HYCODAN  Take 5 mLs by mouth every 6 (six) hours as needed for cough.     KANGAROO EPUMP SET 1000ML Misc  1 Units by Does not apply route continuous.     MIRALAX packet  Generic drug:  polyethylene glycol  Give 17 g by tube every  evening.     OLANZapine 5 MG tablet  Commonly known as:  ZYPREXA  Take 5 mg by mouth at bedtime.     omeprazole 40 MG capsule  Commonly known as:  PRILOSEC  Take 40 mg by mouth 2 (two) times daily.     PARoxetine 30 MG tablet  Commonly known as:  PAXIL  Take 30 mg by mouth at bedtime.     sucralfate 1 G tablet  Commonly known as:  CARAFATE  1 g by PEG Tube route as needed (for stomach problems).       Allergies  Allergen Reactions  . Sulfonamide Derivatives Rash      The results of significant diagnostics from this hospitalization (including imaging, microbiology, ancillary and laboratory) are listed below for reference.    Significant Diagnostic Studies: Dg Chest 2 View  11/22/2013   CLINICAL DATA:  Dehydration, upper respiratory symptoms  EXAM: CHEST  2 VIEW  COMPARISON:  11/17/2013; 08/04/2013  FINDINGS: Grossly unchanged cardiac silhouette and mediastinal contours. There is grossly unchanged mild diffuse slightly nodular thickening of the pulmonary interstitium. Perihilar heterogeneous opacities are grossly unchanged, right greater than left, likely atelectasis. No new discrete focal airspace opacities. No definite pleural effusion or pneumothorax. Grossly unchanged bones including long segment paraspinal cervical, thoracic and lumbar paraspinal fusion. Depending catheter tubing again overlies the medial aspect of the right side of the mediastinum.  IMPRESSION: Grossly unchanged perihilar atelectasis without acute cardiopulmonary disease.   Electronically Signed   By: [name] M.D.   On: 11/22/2013 16:39   Dg Chest 2 View  11/17/2013   CLINICAL DATA:  Cough and congestion.  EXAM: CHEST  2 VIEW  COMPARISON:  Chest x-ray 08/04/2013.  FINDINGS: Lung volumes are normal. No consolidative airspace disease. No pleural effusions. No evidence of pulmonary edema. Heart size is normal. The patient is rotated to the right on today's exam, resulting in distortion of the mediastinal  contours and reduced diagnostic sensitivity and specificity for mediastinal pathology. There is either an abandoned catheter or old abandoned pacemaker wire seen projecting over the central upper thorax, which is not confidently localized on the lateral projection. This is similar to prior examinations. Harrington rods are noted throughout the visualized cervical, thoracic and lumbar spine.  IMPRESSION: 1. No radiographic evidence of acute cardiopulmonary disease. The appearance of the chest is similar to prior studies, as above.   Electronically Signed   By: Vinnie Langton M.D.   On: 11/17/2013 19:16   Dg Abd 1 View  11/22/2013   CLINICAL DATA:  GT placement  EXAM: ABDOMEN - 1 VIEW  COMPARISON:  None.  FINDINGS: Contrast has been injected into the gastrostomy tube. The tip is in the body of the stomach. Contrast fills the stomach and proximal small bowel without evidence of extravasation.  IMPRESSION: Gastrostomy tube is positioned in the body of the stomach.   Electronically Signed   By: Maryclare Bean M.D.   On: 11/22/2013 18:50   Ct Abdomen Pelvis W Contrast  11/23/2013   CLINICAL DATA:  Clogged G-tube.  Vomiting.  EXAM: CT ABDOMEN AND PELVIS WITH CONTRAST  TECHNIQUE: Multidetector CT imaging of the abdomen and pelvis was performed using the standard protocol following bolus administration of intravenous contrast.  CONTRAST:  100 mL of Omnipaque 300 IV contrast  COMPARISON:  CT of the abdomen and pelvis performed 02/06/2010  FINDINGS: Right basilar airspace opacity likely reflects atelectasis, though mild aspiration might have a similar appearance, given clinical concern.  The liver and spleen are unremarkable in appearance. The gallbladder is within normal limits. The pancreas and adrenal glands are unremarkable.  The kidneys are unremarkable in appearance. There is no evidence of hydronephrosis. No renal or ureteral stones are seen, though evaluation for renal stones is limited given contrast within the  renal calyces. No perinephric stranding is appreciated.  The patient's G-tube is seen ending at the antrum of the stomach; the balloon is slightly larger than typically seen, filled with approximately 24 mL of contrast. The stomach is largely decompressed, containing trace air and fluid.  No free fluid is identified. The small bowel is unremarkable in appearance. No acute vascular abnormalities are seen.  The appendix is filled with contrast and air, without evidence for appendicitis. Contrast is seen filling the colon. The colon is unremarkable in appearance.  The bladder is decompressed. Marked bladder wall thickening  may reflect chronic neurogenic bladder. The prostate is borderline normal in size. No inguinal lymphadenopathy is seen.  No acute osseous abnormalities are identified. Right convex thoracolumbar scoliosis is noted. Thoracolumbar spinal fusion rods are noted.  IMPRESSION: 1. G-tube noted ending at the antrum of the stomach; the balloon is slightly larger than typically seen, filled with approximately 24 mL of contrast. The stomach is largely decompressed and grossly unremarkable in appearance. The G-tube is directed towards the pylorus. 2. Marked bladder wall thickening is thought to reflect chronic neurogenic bladder. 3. Right basilar airspace opacity likely reflects atelectasis, though mild aspiration might have a similar appearance, given clinical concern. 4. Right convex thoracolumbar scoliosis noted, with associated hardware.   Electronically Signed   By: [name] Balding M.D.   On: 11/23/2013 02:07    Microbiology: No results found for this or any previous visit (from the past 240 hour(s)).   Labs: Basic Metabolic Panel:  Recent Labs Lab 11/22/13 1600 11/22/13 2259 11/23/13 0343 11/24/13 0305 11/24/13 1915 11/25/13 0500 11/26/13 0800  NA 142  --  140 143  --  146 144  K 3.8  --  3.9 3.4*  --  4.1 4.2  CL 100  --  101 104  --  107 105  CO2 28  --  23 23  --  26 23  GLUCOSE 94   --  83 107*  --  113* 108*  BUN 6  --  4* 7  --  10 12  CREATININE 0.50 0.49* 0.48* 0.51  --  0.53 0.51  CALCIUM 9.2  --  8.6 8.9  --  9.2 9.0  MG  --   --   --  2.2 2.4 2.4  --   PHOS  --   --   --  4.5 3.4  --  4.4   Liver Function Tests:  Recent Labs Lab 11/22/13 1600 11/23/13 0343 11/25/13 0500 11/26/13 0800  AST 25 24 17 20   ALT 19 16 13 16   ALKPHOS 65 61 58 56  BILITOT 0.3 0.4 0.3 0.3  PROT 7.4 6.5 6.9 7.0  ALBUMIN 3.9 3.4* 3.6 3.6   No results found for this basename: LIPASE, AMYLASE,  in the last 168 hours  Recent Labs Lab 11/23/13 0825  AMMONIA 49   CBC:  Recent Labs Lab 11/22/13 1600 11/22/13 2259 11/23/13 0343 11/26/13 0800  WBC 6.4 5.8 6.3 5.7  NEUTROABS  --   --  2.9 2.0  HGB 13.7 13.0 13.3 14.0  HCT 41.8 39.8 41.0 43.0  MCV 89.3 90.7 90.5 90.3  PLT 363 326 336 305   Cardiac Enzymes: No results found for this basename: CKTOTAL, CKMB, CKMBINDEX, TROPONINI,  in the last 168 hours BNP: BNP (last 3 results) No results found for this basename: PROBNP,  in the last 8760 hours CBG:  Recent Labs Lab 11/26/13 1152 11/26/13 1742 11/27/13 0012 11/27/13 0610 11/27/13 0758  GLUCAP 95 92 89 111* 102*       Signed:  Mikiya Nebergall, [name]  [name] 11/27/2013, 11:13 AM

## 2013-11-27 NOTE — Care Management Note (Signed)
Page 1 of 2   11/27/2013     3:07:23 PM   CARE MANAGEMENT NOTE 11/27/2013  Patient:  [name], [name]   Account Number:  [id number]  Date Initiated:  11/26/2013  Documentation initiated by:  [facility] [place]  Subjective/Objective Assessment:   adm: Sent by PCP for conerns of dehdyration     Action/Plan:   discharge planning   Anticipated DC Date:  11/27/2013   Anticipated DC Plan:  [place]  CM consult      Choice offered to / List presented to:  C-6 Parent   DME arranged  SUCTION      DME agency  [place] arranged  HH-1 RN  HH-2 PT  HH-3 OT  HH-4 NURSE'S AIDE  HH-6 SOCIAL WORKER      Status of service:  In process, will continue to follow Medicare Important Message given?   (If response is "NO", the following Medicare IM given date fields will be blank) Date Medicare IM given:   Date Additional Medicare IM given:    Discharge Disposition:    Per UR Regulation:    If discussed at Long Length of Stay Meetings, dates discussed:    Comments:  11-27-13 Face sheet information confirmed with patient's mother.  11-27-13 Ria Comment with [facility] returned phone call. [facility] can provide tude feeding and tube feeding pump , Medicare will pay 80% and parents will be responsible for 20% , Niam Nepomuceno is aware and agreeable . Same will be delivered to home today . [facility] unable to send [facility] to home until tomorrow morning , Mrs Beck aware and understands ( she will provide bolus feedings until that time ) .   Mrs Reznick states Huey Romans has called her regarding delivery of suction   Magdalen Spatz RN BSN   11-27-13  Called [facility] to inquire on tube feeding and tube feeding spoke with Elita Boone was instructed TF and TF pump will be supplied by [employer] , [facility] office [phone]) called same spoke to [place] who stated [facility] does not have a contract with [employer] .  Called [facility] back  Spoke with Rayla , who explained the patient's [employer] does not cover tube feeding , patient has secondary insurance which is Medicare  that would pick up cost , however , [facility] is not allowed to bill an secondary insurance . Rayla recommended CM to call [facility] [phone] and have patient "set up " with an infusion company that can bill a secondary insurance .  Spoke with Thurmond Butts at [employer] [facility] instructed to fax order for tube feeding formula and tube feeding pump to [phone] Patient ID number [phone] and she will submit it for review to see if Christella Scheuermann will cover tube feedings .  [facility] , was told they could bill Medicare for tube feedings and pump , if Medicare approved same Medicare will cover 80% of tube feedings and patient / family would be responsible for 20% .  Rosita Kea in agreement to send referral to [facility] same done. [name] wanting to take patient home , confirmed contact number , will call her when hear back from [facility] . In mean time [name] wants prescription for TF and TF pump , she is willing to order same and pay 100% . MD aware and prescriptions provided. Bedside nurse aware.  Magdalen Spatz RN BSN [phone]    11/26/13 13:10 CM met with Marya Fossa, mother and caretaker of pt in room and had a lengthy discussion concerning her frustrations with [employer].  [facility] is responsible for pump but is closed on weekends: [phone].  [facility] was to deliver to home the suction machine but linda states it was not delivered to home bc [name] has not given them a credit card number for their files.  Both DME devices need to be in place prior to pt going home. Katherine Basset is Nurse Case Mgr with [employer]: [place]. This CM will discuss this pt's needs with weekday CM 11/27/13 who will continue to follow.  Mariane Masters, BSN, CM [phone].

## 2013-11-28 ENCOUNTER — Encounter: Payer: Self-pay | Admitting: Family Medicine

## 2013-11-29 ENCOUNTER — Emergency Department (HOSPITAL_COMMUNITY): Payer: Managed Care, Other (non HMO)

## 2013-11-29 ENCOUNTER — Telehealth: Payer: Self-pay | Admitting: Family Medicine

## 2013-11-29 ENCOUNTER — Emergency Department (HOSPITAL_COMMUNITY)
Admission: EM | Admit: 2013-11-29 | Discharge: 2013-11-29 | Disposition: A | Discharge status: Home / Self Care | Payer: Managed Care, Other (non HMO) | Attending: Emergency Medicine | Admitting: Emergency Medicine

## 2013-11-29 DIAGNOSIS — G4700 Insomnia, unspecified: Secondary | ICD-10-CM | POA: Insufficient documentation

## 2013-11-29 DIAGNOSIS — E860 Dehydration: Secondary | ICD-10-CM | POA: Diagnosis not present

## 2013-11-29 DIAGNOSIS — Z8669 Personal history of other diseases of the nervous system and sense organs: Secondary | ICD-10-CM | POA: Insufficient documentation

## 2013-11-29 DIAGNOSIS — K219 Gastro-esophageal reflux disease without esophagitis: Secondary | ICD-10-CM | POA: Insufficient documentation

## 2013-11-29 DIAGNOSIS — F329 Major depressive disorder, single episode, unspecified: Secondary | ICD-10-CM | POA: Insufficient documentation

## 2013-11-29 DIAGNOSIS — Z79899 Other long term (current) drug therapy: Secondary | ICD-10-CM | POA: Insufficient documentation

## 2013-11-29 DIAGNOSIS — F411 Generalized anxiety disorder: Secondary | ICD-10-CM | POA: Insufficient documentation

## 2013-11-29 DIAGNOSIS — R000 Tachycardia, unspecified: Secondary | ICD-10-CM | POA: Insufficient documentation

## 2013-11-29 DIAGNOSIS — F3289 Other specified depressive episodes: Secondary | ICD-10-CM | POA: Insufficient documentation

## 2013-11-29 LAB — CBC WITH DIFFERENTIAL/PLATELET
Basophils Absolute: 0 10*3/uL (ref 0.0–0.1)
Basophils Relative: 0 % (ref 0–1)
Eosinophils Absolute: 0 10*3/uL (ref 0.0–0.7)
Eosinophils Relative: 0 % (ref 0–5)
HCT: 45.8 % (ref 39.0–52.0)
Hemoglobin: 15.7 g/dL (ref 13.0–17.0)
Lymphocytes Relative: 26 % (ref 12–46)
Lymphs Abs: 2.3 10*3/uL (ref 0.7–4.0)
MCH: 30.5 pg (ref 26.0–34.0)
MCHC: 34.3 g/dL (ref 30.0–36.0)
MCV: 89.1 fL (ref 78.0–100.0)
Monocytes Absolute: 0.8 10*3/uL (ref 0.1–1.0)
Monocytes Relative: 9 % (ref 3–12)
Neutro Abs: 5.9 10*3/uL (ref 1.7–7.7)
Neutrophils Relative %: 66 % (ref 43–77)
Platelets: 304 10*3/uL (ref 150–400)
RBC: 5.14 MIL/uL (ref 4.22–5.81)
RDW: 14.2 % (ref 11.5–15.5)
WBC: 9 10*3/uL (ref 4.0–10.5)

## 2013-11-29 LAB — COMPREHENSIVE METABOLIC PANEL
ALT: 21 U/L (ref 0–53)
AST: 21 U/L (ref 0–37)
Albumin: 4.5 g/dL (ref 3.5–5.2)
Alkaline Phosphatase: 64 U/L (ref 39–117)
BUN: 17 mg/dL (ref 6–23)
CO2: 25 mEq/L (ref 19–32)
Calcium: 9.8 mg/dL (ref 8.4–10.5)
Chloride: 104 mEq/L (ref 96–112)
Creatinine, Ser: 0.65 mg/dL (ref 0.50–1.35)
GFR calc Af Amer: >90 mL/min (ref >90)
GFR calc non Af Amer: >90 mL/min (ref >90)
Glucose, Bld: 110 mg/dL — ABNORMAL HIGH (ref 70–99)
Potassium: 4.2 mEq/L (ref 3.7–5.3)
Sodium: 149 mEq/L — ABNORMAL HIGH (ref 137–147)
Total Bilirubin: 0.5 mg/dL (ref 0.3–1.2)
Total Protein: 8.3 g/dL (ref 6.0–8.3)

## 2013-11-29 LAB — URINALYSIS, ROUTINE W REFLEX MICROSCOPIC
Bilirubin Urine: NEGATIVE
Glucose, UA: NEGATIVE mg/dL
Hgb urine dipstick: NEGATIVE
Ketones, ur: >80 mg/dL — AB
Leukocytes, UA: NEGATIVE
Nitrite: NEGATIVE
Protein, ur: 30 mg/dL — AB
Specific Gravity, Urine: 1.039 — ABNORMAL HIGH (ref 1.005–1.030)
Urobilinogen, UA: 1 mg/dL (ref 0.0–1.0)
pH: 6.5 (ref 5.0–8.0)

## 2013-11-29 LAB — VALPROIC ACID LEVEL: Valproic Acid Lvl: 61.6 ug/mL (ref 50.0–100.0)

## 2013-11-29 LAB — URINE MICROSCOPIC-ADD ON

## 2013-11-29 LAB — LIPASE, BLOOD: Lipase: 19 U/L (ref 11–59)

## 2013-11-29 MED ORDER — SODIUM CHLORIDE 0.9 % IV BOLUS (SEPSIS)
1000.0000 mL | Freq: Once | INTRAVENOUS | Status: AC
  Administered 2013-11-29: 1000 mL via INTRAVENOUS

## 2013-11-29 MED ORDER — PROMETHAZINE HCL 25 MG/ML IJ SOLN
12.5000 mg | Freq: Four times a day (QID) | INTRAMUSCULAR | Status: DC | PRN
  Filled 2013-11-29: qty 1

## 2013-11-29 MED ORDER — LORAZEPAM 2 MG/ML IJ SOLN
1.0000 mg | Freq: Once | INTRAMUSCULAR | Status: AC
  Administered 2013-11-29: 1 mg via INTRAVENOUS
  Filled 2013-11-29: qty 1

## 2013-11-29 MED ORDER — PROMETHAZINE HCL 25 MG PO TABS: 25.0000 mg | ORAL_TABLET | Freq: Four times a day (QID) | ORAL | Status: DC | PRN

## 2013-11-29 MED ORDER — PROMETHAZINE HCL 25 MG/ML IJ SOLN
25.0000 mg | Freq: Once | INTRAMUSCULAR | Status: AC
  Administered 2013-11-29: 25 mg via INTRAVENOUS
  Filled 2013-11-29: qty 1

## 2013-11-29 MED ORDER — PROMETHAZINE HCL 25 MG RE SUPP: 25.0000 mg | Freq: Four times a day (QID) | RECTAL | Status: DC | PRN

## 2013-11-29 MED ORDER — SODIUM CHLORIDE 0.9 % IV SOLN
INTRAVENOUS | Status: DC
  Administered 2013-11-29: 13:00:00 via INTRAVENOUS

## 2013-11-29 MED ORDER — LORAZEPAM 1 MG PO TABS: 2.0000 mg | ORAL_TABLET | ORAL | Status: DC | PRN

## 2013-11-29 NOTE — Telephone Encounter (Signed)
Spoke with pt's mother and informed of provider's recommendations for patient's immediate medical care needs at this time; needs to be taken to ER for A&E, so that appropriate Tx can be given. Caller inquired about getting pt in with Neurology/Psych and stated this was to be addressed at 01.29.15 hospital f/u OV w/Cody Cyndi Lennert; informed caller that this definitely can be addressed at [place] but cannot give any information RE: matter on phone now, as pt has never been seen or evaluated by Mr. Hassell Done. Caller also states that patient needs some promethazine to help him with sleep; informed caller that she can address this matter at ER with pt's medical issues and if there was any problem, she could call me back for Rx, caller understood & agreed/SLS

## 2013-11-29 NOTE — ED Notes (Signed)
[name] Case management speaking with family.

## 2013-11-29 NOTE — ED Notes (Addendum)
Pt. Moved from wheelchair to bed and placed in gown. Pt had 1 episode of "anxiety" per mother in ED pt speaking loudly incomprehensible speech appx 1 min. Pt consolable. Mothers sts these episodes have been happening at home "constantly" pt laying in bed, semi-fowlers, comfortably with normal CP upper extremity movements. Pt sts all his muscles are hurting. Mother sts was discharged Monday, first 24 hours pt did okay, then yesterday at home she has given patient 81mL of tube feeding then pt std "no more no more" because his stomach was full. Pt c/o abdominal pain. Mother noticed "black, thick, mucousy" discharge around the peg tube site and in patients mouth. Black dried content noted in patients roof of mouth. Dr. Zenia Resides made aware. Pt c/o muscles aching and requesting valium.

## 2013-11-29 NOTE — ED Notes (Signed)
RN reviewed discharge instructions with family, who was grateful for assistance and explaining roles of home health, ED, PCP and understanding of discharge instructions. Pt lethargic, vital signs WNL per patient baseline. Pt dressed and placed in wheelchair.

## 2013-11-29 NOTE — Progress Notes (Signed)
[facility] ED CM received referral regarding [place] services adjustment. Pt  Presented to [facility] ED c/o agitation and insomnia. Pt  Was recently discharged from [facility] after being admitted for aspiration pneumonia. PMH CP GERD, Anxiety, Depression, Pt has a PEG with TF. Meet mother in hall and was taken to consultation room for private conversation at [employer] request. Mom states, that patient has been agitated and restless throughout the night for some time now. Mom believes it has increased over the last couple of days. No remarkable finding noted on ED evaluation.  Pt lives at home with parents and has 50hr/week PCAs. Pt was discharged with [facility] RN services. [place] [facility] were to start today however, patient was enroute to the ED.when the were contacted.  Mom reports that they have a f/u appt with PCP tomorrow. Will notify [facility] to update on patient status. No further ED CM needs identified.

## 2013-11-29 NOTE — Discharge Instructions (Signed)
Dehydration, Adult Dehydration is when you lose more fluids from the body than you take in. Vital organs like the kidneys, brain, and heart cannot function without a proper amount of fluids and salt. Any loss of fluids from the body can cause dehydration.  CAUSES   Vomiting.  Diarrhea.  Excessive sweating.  Excessive urine output.  Fever. SYMPTOMS  Mild dehydration  Thirst.  Dry lips.  Slightly dry mouth. Moderate dehydration  Very dry mouth.  Sunken eyes.  Skin does not bounce back quickly when lightly pinched and released.  Dark urine and decreased urine production.  Decreased tear production.  Headache. Severe dehydration  Very dry mouth.  Extreme thirst.  Rapid, weak pulse (more than 100 beats per minute at rest).  Cold hands and feet.  Not able to sweat in spite of heat and temperature.  Rapid breathing.  Blue lips.  Confusion and lethargy.  Difficulty being awakened.  Minimal urine production.  No tears. DIAGNOSIS  Your caregiver will diagnose dehydration based on your symptoms and your exam. Blood and urine tests will help confirm the diagnosis. The diagnostic evaluation should also identify the cause of dehydration. TREATMENT  Treatment of mild or moderate dehydration can often be done at home by increasing the amount of fluids that you drink. It is best to drink small amounts of fluid more often. Drinking too much at one time can make vomiting worse. Refer to the home care instructions below. Severe dehydration needs to be treated at the hospital where you will probably be given intravenous (IV) fluids that contain water and electrolytes. HOME CARE INSTRUCTIONS   Ask your caregiver about specific rehydration instructions.  Drink enough fluids to keep your urine clear or pale yellow.  Drink small amounts frequently if you have nausea and vomiting.  Eat as you normally do.  Avoid:  Foods or drinks high in sugar.  Carbonated  drinks.  Juice.  Extremely hot or cold fluids.  Drinks with caffeine.  Fatty, greasy foods.  Alcohol.  Tobacco.  Overeating.  Gelatin desserts.  Wash your hands well to avoid spreading bacteria and viruses.  Only take over-the-counter or prescription medicines for pain, discomfort, or fever as directed by your caregiver.  Ask your caregiver if you should continue all prescribed and over-the-counter medicines.  Keep all follow-up appointments with your caregiver. SEEK MEDICAL CARE IF:  You have abdominal pain and it increases or stays in one area (localizes).  You have a rash, stiff neck, or severe headache.  You are irritable, sleepy, or difficult to awaken.  You are weak, dizzy, or extremely thirsty. SEEK IMMEDIATE MEDICAL CARE IF:   You are unable to keep fluids down or you get worse despite treatment.  You have frequent episodes of vomiting or diarrhea.  You have blood or green matter (bile) in your vomit.  You have blood in your stool or your stool looks black and tarry.  You have not urinated in 6 to 8 hours, or you have only urinated a small amount of very dark urine.  You have a fever.  You faint. MAKE SURE YOU:   Understand these instructions.  Will watch your condition.  Will get help right away if you are not doing well or get worse. Document Released: 10/19/2005 Document Revised: 01/11/2012 Document Reviewed: 06/08/2011 ExitCare Patient Information 2014 [employer].  

## 2013-11-29 NOTE — ED Notes (Signed)
Dr. Zenia Resides spoke to patient. Wanda Case management called to assist.

## 2013-11-29 NOTE — Telephone Encounter (Signed)
Was in the hospital for failure to thrive.  He has not had a tube feeding since yesterday at 2.  He only slept about 2 hours.  He is screaming  In pain and anxiety.  She gave him 10mg  melatonin and 30 mg diazepam.  What should she do.

## 2013-11-29 NOTE — ED Notes (Addendum)
Pt sts he is anxious about going home due to pain. RN asking what patient thinks will help him. Pt unable to answer- pt asking if Pain medications PRN for patient will help. Pt sts yes.

## 2013-11-29 NOTE — ED Notes (Signed)
Pt very anxious about going home after mother spoke with Dr. Zenia Resides, pt speaking loudly.

## 2013-11-29 NOTE — ED Provider Notes (Signed)
CSN: [id number]     Arrival date & time 11/29/13  1115 History   First MD Initiated Contact with Patient 11/29/13 1121     Chief Complaint  Patient presents with  . Agitation  . Insomnia   (Consider location/radiation/quality/duration/timing/severity/associated sxs/prior Treatment) The history is provided by a parent, the patient and medical records.   patient here with increased agitation x24 hours. Patient recently discharged the hospital after admission for aspiration pneumonia. Mother has given the patient Valium for his agitation without improvement. No reported history of patient being short of breath or coughing up blood he does note some vague abdominal discomfort. He has not had a bowel movement in over 5 days. Patient has a history of cerebral palsy and has limited medication. No noted fever or chills. No reported chest pain. Nothing makes the symptoms better or worse.  Past Medical History  Diagnosis Date  . Cerebral palsy   . GERD (gastroesophageal reflux disease)   . Anxiety   . Depression   . Thyroid disease     hyper  . Incontinence of feces   . Palpitations   . Esophagitis   . Dehydration 11/22/2013   Past Surgical History  Procedure Laterality Date  . Spine surgery  ,11/20/2010, 2011  . Eye surgery    . Ears tubes    . Hamstring released    . Baclofen trial    . Baslofen pump implant    . Spinal fusion    . G-tube insert  August 2006  . Spinal fusioncorrect 106 degree kyphosis    . Spinal fusion to correct 70 degree kyphosis    . Tonsillectomy    . Peg placement  10/21/2011    Procedure: PERCUTANEOUS ENDOSCOPIC GASTROSTOMY (PEG) REPLACEMENT;  Surgeon: [name], MD;  Location: [facility];  Service: Endoscopy;  Laterality: N/A;  . Peg placement N/A 06/13/2013    Procedure: PERCUTANEOUS ENDOSCOPIC GASTROSTOMY (PEG) REPLACEMENT;  Surgeon: [name], MD;  Location: [facility];  Service: Endoscopy;  Laterality: N/A;   Family History  Problem Relation  Age of Onset  . Asthma Mother   . Hyperlipidemia Mother   . COPD Mother   . Other Mother     bronchial stasis/ABPA  . Cancer Maternal Grandmother 27    breast  . Hyperlipidemia Maternal Grandmother   . Hypertension Maternal Grandmother   . Cancer Maternal Grandfather     prostate  . Heart disease Paternal Grandfather     CHF  . Osteoporosis Paternal Grandmother   . Arthritis Paternal Grandmother     rheumatoid   History  Substance Use Topics  . Smoking status: Never Smoker   . Smokeless tobacco: Never Used  . Alcohol Use: No    Review of Systems  All other systems reviewed and are negative.    Allergies  Sulfonamide derivatives  Home Medications   Current Outpatient Rx  Name  Route  Sig  Dispense  Refill  . AMBULATORY NON FORMULARY MEDICATION      Medication Name: MIC gastrostomy/bolus feeding tube 24 French Part number 0110-24. #2 and 10 cc lurer lock syringe #2 Dx:   2 Device   1     Dx: 783.3 abd 343.9   . amoxicillin-clavulanate (AUGMENTIN) 400-57 MG/5ML suspension   Oral   Take 10 mLs by mouth 2 (two) times daily.         Marland Kitchen amoxicillin-clavulanate (AUGMENTIN) 875-125 MG per tablet   Oral   Take 1 tablet by mouth every  12 (twelve) hours.   16 tablet   0   . COD LIVER OIL PO   Oral   Take 5 mLs by mouth daily.         . diazepam (VALIUM) 5 MG tablet   Oral   Take 5 mg by mouth 3 (three) times daily as needed for anxiety.          . divalproex (DEPAKOTE SPRINKLE) 125 MG capsule   Oral   Take 250-500 mg by mouth 2 (two) times daily. Takes 250mg  in the morning and 500mg  in the evening         . Feeding Tubes - Bags (FLEXIFLO FEEDING BAG/PUMP) MISC   Does not apply   1 Units by Does not apply route continuous.   1 each   30   . Feeding Tubes - Pump MISC   Does not apply   1 Units by Does not apply route continuous.   1 each   0   . Feeding Tubes - Sets (KANGAROO EPUMP SET 1000ML) MISC   Does not apply   1 Units by Does not  apply route continuous.   1 each   0   . Melatonin 3 MG CAPS   Oral   Take 9 mg by mouth at bedtime.         . Nutritional Supplements (FEEDING SUPPLEMENT, OSMOLITE 1.2 CAL,) LIQD   Per Tube   Place 1,000 mLs into feeding tube daily.   1000 mL   30   . OLANZapine (ZYPREXA) 5 MG tablet   Oral   Take 5 mg by mouth at bedtime.         Marland Kitchen omeprazole (PRILOSEC) 40 MG capsule   Oral   Take 40 mg by mouth 2 (two) times daily.         Marland Kitchen PARoxetine (PAXIL) 30 MG tablet   Oral   Take 30 mg by mouth at bedtime.         . Probiotic Product (ADVANCED PROBIOTIC 10) CAPS   Tube   Give 1 capsule by tube daily.          . sucralfate (CARAFATE) 1 G tablet   PEG Tube   1 g by PEG Tube route as needed (for stomach problems).           BP   Pulse 134  Temp(Src) 98.7 F (37.1 C) (Axillary)  Resp 16  SpO2 95% Physical Exam  Nursing note and vitals reviewed. Constitutional: He appears well-developed and well-nourished.  Non-toxic appearance. No distress.  HENT:  Head: Normocephalic and atraumatic.  Eyes: Conjunctivae, EOM and lids are normal. Pupils are equal, round, and reactive to light.  Neck: Normal range of motion. Neck supple. No tracheal deviation present. No mass present.  Cardiovascular: Regular rhythm and normal heart sounds.  Tachycardia present.  Exam reveals no gallop.   No murmur heard. Pulmonary/Chest: Effort normal and breath sounds normal. No stridor. No respiratory distress. He has no decreased breath sounds. He has no wheezes. He has no rhonchi. He has no rales.  Abdominal: Soft. Normal appearance and bowel sounds are normal. He exhibits no distension. There is no tenderness. There is no rebound and no CVA tenderness.  Musculoskeletal: Normal range of motion. He exhibits no edema and no tenderness.  Neurological: He is alert. No cranial nerve deficit. GCS eye subscore is 4. GCS verbal subscore is 5. GCS motor subscore is 6.  Patient with upper extremity  flexion contractures noted  Skin: Skin is warm and dry. No abrasion and no rash noted.  Psychiatric: His mood appears anxious. His speech is delayed. He is agitated.    ED Course  Procedures (including critical care time) Labs Review Labs Reviewed - No data to display Imaging Review No results found.  EKG Interpretation   None       MDM  No diagnosis found. Patient was agitated when he arrived here. Was given Ativan as well as Phenergan. Was reassessed multiple times and was resting more comfortably. He had periods of agitation his mother explained to him that he'll be going home. Urinalysis and lower are consistent with dehydration and patient given 3 L of IV saline. Patient seen by social work and increased assistance at home as been scheduled. Mother and father carpal taking the patient home and he will followup with his Dr. tomorrow. I will give patient a prescription for Phenergan as well as Ativan. Patient has been transiently tachycardic and this is due to his agitation. Patient has no signs of worsening aspiration pneumonia. No concern for pulmonary embolism. The patient is calm relaxed heart rate is in the 90s.       Leota Jacobsen, MD 11/29/13 [phone]

## 2013-11-29 NOTE — Telephone Encounter (Signed)
If patient is not tolerating feedings and is in extreme pain, he should be evaluated in the ER.  I have never seen or evaluated the patient.

## 2013-11-29 NOTE — ED Notes (Signed)
Mother spoke to RN- sts spoke to RN on phone about discharge and mother updated RN. RN on phone sts to request neurological consult to MD or to speak to CN. This RN spoke to patient about what she would like for [place] to do and engaged in active listening with mothers stress. RN offered comfort and will updated Dr. Zenia Resides.

## 2013-11-29 NOTE — ED Notes (Signed)
Dr. Zenia Resides made aware of pt muscle pains, and valium intake. Mothers sts 30mg  via PEG in last 24 hours.

## 2013-11-29 NOTE — ED Notes (Signed)
Pt still at X-ray 

## 2013-11-29 NOTE — Telephone Encounter (Signed)
See 11/29/13 phone note.

## 2013-11-29 NOTE — ED Notes (Signed)
Pt. Voided.

## 2013-11-29 NOTE — ED Notes (Signed)
Pt from home, multiple complaints. Per caregiver, pt has not been sleeping since hospital discharge, pt becoming agitated. Per Care giver, pt started tube feeding, no BM since discharge. Pt asks for feeding to stop after beginning.

## 2013-11-29 NOTE — ED Notes (Signed)
Janett Billow RN acting as CN/AD role speaking to patient.

## 2013-11-30 ENCOUNTER — Encounter: Payer: Self-pay | Admitting: Physician Assistant

## 2013-11-30 ENCOUNTER — Ambulatory Visit (INDEPENDENT_AMBULATORY_CARE_PROVIDER_SITE_OTHER): Payer: Managed Care, Other (non HMO) | Admitting: Physician Assistant

## 2013-11-30 ENCOUNTER — Emergency Department (HOSPITAL_BASED_OUTPATIENT_CLINIC_OR_DEPARTMENT_OTHER)
Admission: EM | Admit: 2013-11-30 | Discharge: 2013-11-30 | Discharge status: Against Medical Advice | Payer: Managed Care, Other (non HMO) | Attending: Emergency Medicine | Admitting: Emergency Medicine

## 2013-11-30 ENCOUNTER — Encounter: Payer: Self-pay | Admitting: Family Medicine

## 2013-11-30 ENCOUNTER — Other Ambulatory Visit: Payer: Self-pay | Admitting: Physician Assistant

## 2013-11-30 VITALS — BP 120/76 | HR 103 | Temp 98.1°F | Resp 16 | Ht 62.0 in | Wt 107.4 lb

## 2013-11-30 DIAGNOSIS — K9420 Gastrostomy complication, unspecified: Secondary | ICD-10-CM

## 2013-11-30 DIAGNOSIS — Y833 Surgical operation with formation of external stoma as the cause of abnormal reaction of the patient, or of later complication, without mention of misadventure at the time of the procedure: Secondary | ICD-10-CM | POA: Insufficient documentation

## 2013-11-30 DIAGNOSIS — F411 Generalized anxiety disorder: Secondary | ICD-10-CM

## 2013-11-30 DIAGNOSIS — N1370 Vesicoureteral-reflux, unspecified: Secondary | ICD-10-CM | POA: Diagnosis not present

## 2013-11-30 DIAGNOSIS — E860 Dehydration: Secondary | ICD-10-CM

## 2013-11-30 DIAGNOSIS — K9423 Gastrostomy malfunction: Secondary | ICD-10-CM | POA: Insufficient documentation

## 2013-11-30 DIAGNOSIS — G809 Cerebral palsy, unspecified: Secondary | ICD-10-CM

## 2013-11-30 DIAGNOSIS — J690 Pneumonitis due to inhalation of food and vomit: Secondary | ICD-10-CM

## 2013-11-30 LAB — URINE CULTURE
Colony Count: NO GROWTH
Culture: NO GROWTH

## 2013-11-30 MED ORDER — LORAZEPAM 1 MG PO TABS: 2.0000 mg | ORAL_TABLET | ORAL | Status: DC | PRN

## 2013-11-30 MED ORDER — PROMETHAZINE HCL 25 MG PO TABS: 25.0000 mg | ORAL_TABLET | Freq: Four times a day (QID) | ORAL | Status: DC | PRN

## 2013-11-30 NOTE — Patient Instructions (Signed)
Please proceed to the ER to have a foley placed in the G-tube opening.  Please go to Interventional Radiology at [facility] at 7:45 am for G-tube replacement.  This is very important.  I will call you in regards to your referral to Neurology and Urology.  You should hear something tomorrow.  Please have home health check a CBC, BMP and Urinalysis on the patient when they come next week.

## 2013-11-30 NOTE — Progress Notes (Signed)
Pre visit review using our clinic review tool, if applicable. No additional management support is needed unless otherwise documented below in the visit note/SLS  

## 2013-11-30 NOTE — ED Notes (Signed)
Pt here with family member who states pt's g-tube came out while he was at a follow up appt with MD upstais. Pt is non-verbal and in motorized wheelchair. Family member expressing frustration that pt unable to have G-tube replaced until tomorrow am by interventional radiology. Family states that she will take pt home because she has a g-tube and has been trained to change them. Advised pt to allow EDP to screen pt and allow make recommendations for treatment. Family member declined and left triage area with patient. Did not sign AMA form.

## 2013-12-01 ENCOUNTER — Encounter: Payer: Self-pay | Admitting: Family Medicine

## 2013-12-01 ENCOUNTER — Telehealth: Payer: Self-pay | Admitting: Family Medicine

## 2013-12-01 ENCOUNTER — Ambulatory Visit (INDEPENDENT_AMBULATORY_CARE_PROVIDER_SITE_OTHER): Payer: Managed Care, Other (non HMO) | Admitting: Family Medicine

## 2013-12-01 ENCOUNTER — Other Ambulatory Visit: Payer: Self-pay | Admitting: *Deleted

## 2013-12-01 ENCOUNTER — Telehealth: Payer: Self-pay | Admitting: *Deleted

## 2013-12-01 ENCOUNTER — Ambulatory Visit (HOSPITAL_COMMUNITY): Admission: RE | Admit: 2013-12-01 | Payer: Managed Care, Other (non HMO) | Source: Ambulatory Visit

## 2013-12-01 VITALS — BP 108/80 | HR 106 | Temp 98.2°F | Ht 62.0 in

## 2013-12-01 DIAGNOSIS — B379 Candidiasis, unspecified: Secondary | ICD-10-CM

## 2013-12-01 DIAGNOSIS — R451 Restlessness and agitation: Secondary | ICD-10-CM

## 2013-12-01 DIAGNOSIS — Z931 Gastrostomy status: Secondary | ICD-10-CM

## 2013-12-01 DIAGNOSIS — J690 Pneumonitis due to inhalation of food and vomit: Secondary | ICD-10-CM

## 2013-12-01 DIAGNOSIS — F411 Generalized anxiety disorder: Secondary | ICD-10-CM

## 2013-12-01 DIAGNOSIS — IMO0002 Reserved for concepts with insufficient information to code with codable children: Secondary | ICD-10-CM | POA: Diagnosis not present

## 2013-12-01 DIAGNOSIS — K9420 Gastrostomy complication, unspecified: Secondary | ICD-10-CM

## 2013-12-01 DIAGNOSIS — K9429 Other complications of gastrostomy: Secondary | ICD-10-CM

## 2013-12-01 DIAGNOSIS — E860 Dehydration: Secondary | ICD-10-CM

## 2013-12-01 DIAGNOSIS — G809 Cerebral palsy, unspecified: Secondary | ICD-10-CM

## 2013-12-01 MED ORDER — FLUCONAZOLE 40 MG/ML PO SUSR: ORAL | Status: DC

## 2013-12-01 MED ORDER — NYSTATIN 100000 UNIT/GM EX CREA: 1.0000 "application " | TOPICAL_CREAM | Freq: Two times a day (BID) | CUTANEOUS | Status: DC | PRN

## 2013-12-01 NOTE — Telephone Encounter (Signed)
Message copied by [name] on Fri Dec 01, 2013  2:47 PM ------      Message from: [name]      Created: Fri Dec 01, 2013  1:19 PM       Hello Erline Levine. Thanks for your note. I have just spoken to Mrs. Curley. She replaced  the G-tube herself she is qualified to do that. She has already replaced it once before, it is much cheaper for her to do that than go to ED or IR. Unfortunately [name] has been aspirating and his general neurological condition has  deteriorated in the last few months. We will give him a trial of Reglan liquid 5 mg before each tube feeding and make sure she follows  strict antireflux measures. We may consider in the future a  placement of postpyloric feeding tube through existing gastrostomy. That would have to be done in IR. She will give me a call in 2 weeks and we will consider Plan B. Thanks for you know e know if you need anything I know him and his mother very well.DB      Regina, please, call in Reglan liquid 5mg /tsp, disp 1 tsp before each tube feeding, not to exceed 4/day. 12,oz, 1 refill.      ----- Message -----         From: [name], MD         Sent: 12/01/2013  12:15 PM           To: [name], MD            Good Afternoon            I have just inherited this patient from Dr Etter Sjogren. It has been very complicated. I have already seen and admitted him once in the 2 weeks he has been with [place]. They were in my physician assistant yesterday for hospital follow up and while they were here the Mother accidentally pulled out his G Tube and then left ER AMA when they were supposed to have it replaced. Then my PA set up an appt for them to see IR this am to have it placed. Unfortunately they did not go.      The mother is reporting that she replaced the GTube herself and that was trained and given permission to do that herself by gastro.       Just wondering if you remember these folks or can speak to whether or not you feel comfortable with the mother placing  the GTube.             I am trying to decide if I am going to take her at her word or make her come back in here or to ED to check the GTube placement again. Just do not know them well enough yet.            Any thoughts you have would be helpful            Thanks            [name]       ------

## 2013-12-01 NOTE — Telephone Encounter (Signed)
pts mother sent you and [name] a message.  You can look in mychart to see what was sent to Dr Charlett Blake  [name]'s message read:  All is well. [place] home. At 4:20 I inserted new G-tube. I inflated balloon w/sodium chloride syringe and put 6 oz water through G-tube I then set up his tube feeds to resume along with water flushes for hydration. G-tube is in place for all meds. IR and and ER options not acceptable because [name] could not be w/o food/water/meds for the next 16 hrs and would h/b detrimental to him.

## 2013-12-01 NOTE — Telephone Encounter (Signed)
Spoke with Dr. Olevia Perches about Reglan rx that was ordered. Interaction with Paxil. D/c'ed order for Reglan. [name] aware.

## 2013-12-01 NOTE — Telephone Encounter (Signed)
Unfortunately, Reglan is contraindicated with Paxil, so we cannot use it.

## 2013-12-01 NOTE — Progress Notes (Signed)
Pre visit review using our clinic review tool, if applicable. No additional management support is needed unless otherwise documented below in the visit note. 

## 2013-12-01 NOTE — Telephone Encounter (Signed)
Thanks so much for the reassurance. We are happy to get to know them I just needed some guidance as we get to know each other.

## 2013-12-01 NOTE — Telephone Encounter (Signed)
I tried to fax paperwork to [place] at [place] 5 times and got fax failed: busy  I called and left a message for Allie to call me on Monday and I called and informed the pts mother. Pts mother states she is on the phone with another case Freight forwarder. Pts mother got another fax number [phone] Ref/intake number [phone]  Faxed

## 2013-12-03 ENCOUNTER — Encounter: Payer: Self-pay | Admitting: Family Medicine

## 2013-12-03 NOTE — Assessment & Plan Note (Signed)
Mother accidentally removed gastrostomy tube in clinic.  Triaged to ER for temporary foley placement.  Scheduled G-tube replacement with IR tomorrow morning.  Mother endorses being trained to replace G-tube herself, however I cannot verify this at present time.  Encouraged mother to take patient to ER as discussed.

## 2013-12-03 NOTE — Assessment & Plan Note (Addendum)
Repeat CBC.  Patient's symptoms improving.  Patient with [facility] and Speech therapy.  Follow-up in 1-2 weeks.

## 2013-12-03 NOTE — Assessment & Plan Note (Signed)
Cough is improving. Speech Therapy was out to house and she has cleared him for some po intake again per [facility] office [phone]

## 2013-12-03 NOTE — Progress Notes (Signed)
Patient presents to clinic today c/o for ER/Hospital follow-up.  Patient was seen as a new patient by Dr. Charlett Blake on 11/22/13.  At that time, patient was found to be dehydrated.  Patient was sent from office to [facility] ER. Patient was then admitted to the hospital.  Patient was diagnosed with aspiration pneumonia.  Patient had necessary workup and treatment.  Patient was discharged with PO augmentin and instructions to continue tube feedings. Mother endorses giving medication to patient through his feeding tube as prescribed. States patient was initially doing better, but then became extremely agitated.  Mother then endorses deciding to call the office.  [name] called yesterday complaining that her son had been extremely agitated and had been howling in pain.  Patient was requesting medications be sent to the pharmacy.  Patient's PCP was out of office and this provider would not call in RX due to never evaluating patient.  Mother was advised to take patient to the ER.  At ER visit on 11/29/13, patient was found to be dehydrated.  Patient was given IV fluids and medication for anxiety.  Patient was found to be stable and calm and was therefore discharged.  Mother states patient has been doing much better.  The mother is concerned because the patient's agitation is worsening from his baseline.  She is requesting a referral to Neurology as the patient does not currently have a neurologist.  Mother is also requesting refill to Urology due to patient's bladder reflux diagnosed by previous physician.  Mother states the patient has been tolerating his tube feedings.  States he has been sleeping decently well the past few nights but had been restless prior.  Patient has home health coming out several times a week to help with care.  At the end of the visit the patient's mother has accidentally pulled the patient's G-tube out.  States that she has been trained to replace G-tubes.  I cannot verify  this. Mother triaged to ER for foley placement until IR can replace G-tube.  Appointment scheduled with IR for g-tube placement tomorrow morning.  Past Medical History  Diagnosis Date  . Cerebral palsy   . GERD (gastroesophageal reflux disease)   . Anxiety   . Depression   . Thyroid disease     hyper  . Incontinence of feces   . Palpitations   . Esophagitis   . Dehydration 11/22/2013    Current Outpatient Prescriptions on File Prior to Visit  Medication Sig Dispense Refill  . AMBULATORY NON FORMULARY MEDICATION Medication Name: MIC gastrostomy/bolus feeding tube 24 French Part number 0110-24. #2 and 10 cc lurer lock syringe #2 Dx:  2 Device  1  . amoxicillin-clavulanate (AUGMENTIN) 400-57 MG/5ML suspension Take 10 mLs by mouth 2 (two) times daily.      . COD LIVER OIL PO Take 5 mLs by mouth daily.      . divalproex (DEPAKOTE SPRINKLE) 125 MG capsule Take 250-500 mg by mouth 2 (two) times daily. Takes 278m in the morning and 5033min the evening      . Feeding Tubes - Bags (FLEXIFLO FEEDING BAG/PUMP) MISC 1 Units by Does not apply route continuous.  1 each  30  . Feeding Tubes - Pump MISC 1 Units by Does not apply route continuous.  1 each  0  . Feeding Tubes - Sets (KANGAROO EPUMP SET 1000ML) MISC 1 Units by Does not apply route continuous.  1 each  0  . Melatonin 3 MG CAPS Take  9 mg by mouth at bedtime.      . Nutritional Supplements (FEEDING SUPPLEMENT, OSMOLITE 1.2 CAL,) LIQD Place 1,000 mLs into feeding tube daily.  1000 mL  30  . OLANZapine (ZYPREXA) 5 MG tablet Take 5 mg by mouth at bedtime.      Marland Kitchen omeprazole (PRILOSEC) 40 MG capsule Take 40 mg by mouth 2 (two) times daily.      Marland Kitchen PARoxetine (PAXIL) 30 MG tablet Take 30 mg by mouth at bedtime.      . Probiotic Product (ADVANCED PROBIOTIC 10) CAPS Give 1 capsule by tube daily.       . sucralfate (CARAFATE) 1 G tablet 1 g by PEG Tube route as needed (for stomach problems).       . diazepam (VALIUM) 5 MG tablet Take 5 mg by mouth  3 (three) times daily as needed for anxiety.        No current facility-administered medications on file prior to visit.    Allergies  Allergen Reactions  . Ambien [Zolpidem Tartrate] Nausea Only  . Sulfonamide Derivatives Rash    Family History  Problem Relation Age of Onset  . Asthma Mother   . Hyperlipidemia Mother   . COPD Mother   . Other Mother     bronchial stasis/ABPA  . Cancer Maternal Grandmother 9    breast  . Hyperlipidemia Maternal Grandmother   . Hypertension Maternal Grandmother   . Cancer Maternal Grandfather     prostate  . Heart disease Paternal Grandfather     CHF  . Osteoporosis Paternal Grandmother   . Arthritis Paternal Grandmother     rheumatoid    History   Social History  . Marital Status: Single    Spouse Name: N/A    Number of Children: 0  . Years of Education: N/A   Occupational History  . disbaled    Social History Main Topics  . Smoking status: Never Smoker   . Smokeless tobacco: Never Used  . Alcohol Use: No  . Drug Use: No  . Sexual Activity: No   Other Topics Concern  . None   Social History Narrative  . None   Review of Systems - cannot be accurately obtained due to patient's medical conditions  Filed Vitals:   11/30/13 1342  BP: 120/76  Pulse: 103  Temp: 98.1 F (36.7 C)  Resp: 16   Physical Exam  Constitutional: He is well-developed, well-nourished, and in no distress.  Patient with CP, bound to wheelchair.  HENT:  Head: Normocephalic and atraumatic.  Right Ear: External ear normal.  Left Ear: External ear normal.  Nose: Nose normal.  Mouth/Throat: Oropharynx is clear and moist. No oropharyngeal exudate.  Eyes: Conjunctivae are normal. Pupils are equal, round, and reactive to light.  Cardiovascular: Normal rate, regular rhythm and normal heart sounds.   Pulmonary/Chest: Effort normal and breath sounds normal. No respiratory distress. He has no wheezes. He has no rales. He exhibits no tenderness.   Abdominal:  G-tube in place.  Lymphadenopathy:    He has no cervical adenopathy.  Neurological: He is alert.  Skin: Skin is warm and dry. No rash noted.  Psychiatric:  agitated    Recent Results (from the past 2160 hour(s))  POCT INFLUENZA A/B     Status: Normal   Collection Time    11/17/13  5:04 PM      Result Value Range   Influenza A, POC Negative     Influenza B, POC Negative  POCT URINALYSIS DIPSTICK     Status: Normal   Collection Time    11/17/13  5:09 PM      Result Value Range   Color, UA Dark Yellow     Clarity, UA Clear     Glucose, UA Negative     Bilirubin, UA Negative     Ketones, UA Negative     Spec Grav, UA 1.010     Blood, UA Negative     pH, UA 7.5     Protein, UA Negative     Urobilinogen, UA 0.2     Nitrite, UA Negative     Leukocytes, UA Negative    BASIC METABOLIC PANEL     Status: None   Collection Time    11/17/13  6:10 PM      Result Value Range   Sodium 142  137 - 147 mEq/L   Potassium 3.8  3.7 - 5.3 mEq/L   Chloride 99  96 - 112 mEq/L   CO2 28  19 - 32 mEq/L   Glucose, Bld 87  70 - 99 mg/dL   BUN 11  6 - 23 mg/dL   Creatinine, Ser 0.60  0.50 - 1.35 mg/dL   Calcium 9.4  8.4 - 10.5 mg/dL   GFR calc non Af Amer >90  >90 mL/min   GFR calc Af Amer >90  >90 mL/min   Comment: (NOTE)     The eGFR has been calculated using the CKD EPI equation.     This calculation has not been validated in all clinical situations.     eGFR's persistently <90 mL/min signify possible Chronic Kidney     Disease.  CBC WITH DIFFERENTIAL     Status: None   Collection Time    11/17/13  6:10 PM      Result Value Range   WBC 7.3  4.0 - 10.5 K/uL   RBC 4.54  4.22 - 5.81 MIL/uL   Hemoglobin 13.2  13.0 - 17.0 g/dL   HCT 40.1  39.0 - 52.0 %   MCV 88.3  78.0 - 100.0 fL   MCH 29.1  26.0 - 34.0 pg   MCHC 32.9  30.0 - 36.0 g/dL   RDW 13.2  11.5 - 15.5 %   Platelets 286  150 - 400 K/uL   Neutrophils Relative % 56  43 - 77 %   Neutro Abs 4.1  1.7 - 7.7 K/uL    Lymphocytes Relative 32  12 - 46 %   Lymphs Abs 2.4  0.7 - 4.0 K/uL   Monocytes Relative 9  3 - 12 %   Monocytes Absolute 0.7  0.1 - 1.0 K/uL   Eosinophils Relative 2  0 - 5 %   Eosinophils Absolute 0.2  0.0 - 0.7 K/uL   Basophils Relative 0  0 - 1 %   Basophils Absolute 0.0  0.0 - 0.1 K/uL  INFLUENZA PANEL BY PCR (TYPE A & B, H1N1)     Status: None   Collection Time    11/17/13  6:11 PM      Result Value Range   Influenza A By PCR NEGATIVE  NEGATIVE   Influenza B By PCR NEGATIVE  NEGATIVE   H1N1 flu by pcr NOT DETECTED  NOT DETECTED   Comment:            The Xpert Flu assay (FDA approved for     nasal aspirates or washes and     nasopharyngeal swab specimens),  is     intended as an aid in the diagnosis of     influenza and should not be used as     a sole basis for treatment.     Performed at [facility]  CBC     Status: None   Collection Time    11/22/13  4:00 PM      Result Value Range   WBC 6.4  4.0 - 10.5 K/uL   RBC 4.68  4.22 - 5.81 MIL/uL   Hemoglobin 13.7  13.0 - 17.0 g/dL   HCT 41.8  39.0 - 52.0 %   MCV 89.3  78.0 - 100.0 fL   MCH 29.3  26.0 - 34.0 pg   MCHC 32.8  30.0 - 36.0 g/dL   RDW 14.2  11.5 - 15.5 %   Platelets 363  150 - 400 K/uL  COMPREHENSIVE METABOLIC PANEL     Status: None   Collection Time    11/22/13  4:00 PM      Result Value Range   Sodium 142  137 - 147 mEq/L   Potassium 3.8  3.7 - 5.3 mEq/L   Chloride 100  96 - 112 mEq/L   CO2 28  19 - 32 mEq/L   Glucose, Bld 94  70 - 99 mg/dL   BUN 6  6 - 23 mg/dL   Creatinine, Ser 0.50  0.50 - 1.35 mg/dL   Calcium 9.2  8.4 - 10.5 mg/dL   Total Protein 7.4  6.0 - 8.3 g/dL   Albumin 3.9  3.5 - 5.2 g/dL   AST 25  0 - 37 U/L   ALT 19  0 - 53 U/L   Alkaline Phosphatase 65  39 - 117 U/L   Total Bilirubin 0.3  0.3 - 1.2 mg/dL   GFR calc non Af Amer >90  >90 mL/min   GFR calc Af Amer >90  >90 mL/min   Comment: (NOTE)     The eGFR has been calculated using the CKD EPI equation.     This calculation  has not been validated in all clinical situations.     eGFR's persistently <90 mL/min signify possible Chronic Kidney     Disease.  URINALYSIS, ROUTINE W REFLEX MICROSCOPIC     Status: None   Collection Time    11/22/13  4:05 PM      Result Value Range   Color, Urine YELLOW  YELLOW   APPearance CLEAR  CLEAR   Specific Gravity, Urine 1.008  1.005 - 1.030   pH 8.0  5.0 - 8.0   Glucose, UA NEGATIVE  NEGATIVE mg/dL   Hgb urine dipstick NEGATIVE  NEGATIVE   Bilirubin Urine NEGATIVE  NEGATIVE   Ketones, ur NEGATIVE  NEGATIVE mg/dL   Protein, ur NEGATIVE  NEGATIVE mg/dL   Urobilinogen, UA 1.0  0.0 - 1.0 mg/dL   Nitrite NEGATIVE  NEGATIVE   Leukocytes, UA NEGATIVE  NEGATIVE   Comment: MICROSCOPIC NOT DONE ON URINES WITH NEGATIVE PROTEIN, BLOOD, LEUKOCYTES, NITRITE, OR GLUCOSE <1000 mg/dL.  CG4 I-STAT (LACTIC ACID)     Status: None   Collection Time    11/22/13  4:08 PM      Result Value Range   Lactic Acid, Venous 0.71  0.5 - 2.2 mmol/L  INFLUENZA PANEL BY PCR (TYPE A & B, H1N1)     Status: None   Collection Time    11/22/13  8:18 PM      Result Value Range  Influenza A By PCR NEGATIVE  NEGATIVE   Influenza B By PCR NEGATIVE  NEGATIVE   H1N1 flu by pcr NOT DETECTED  NOT DETECTED   Comment:            The Xpert Flu assay (FDA approved for     nasal aspirates or washes and     nasopharyngeal swab specimens), is     intended as an aid in the diagnosis of     influenza and should not be used as     a sole basis for treatment.  TSH     Status: None   Collection Time    11/22/13 10:59 PM      Result Value Range   TSH 0.519  0.350 - 4.500 uIU/mL   Comment: Performed at [place] ACID LEVEL     Status: Abnormal   Collection Time    11/22/13 10:59 PM      Result Value Range   Valproic Acid Lvl 34.4 (*) 50.0 - 100.0 ug/mL  CBC     Status: None   Collection Time    11/22/13 10:59 PM      Result Value Range   WBC 5.8  4.0 - 10.5 K/uL   RBC 4.39  4.22 - 5.81  MIL/uL   Hemoglobin 13.0  13.0 - 17.0 g/dL   HCT 39.8  39.0 - 52.0 %   MCV 90.7  78.0 - 100.0 fL   MCH 29.6  26.0 - 34.0 pg   MCHC 32.7  30.0 - 36.0 g/dL   RDW 14.4  11.5 - 15.5 %   Platelets 326  150 - 400 K/uL  CREATININE, SERUM     Status: Abnormal   Collection Time    11/22/13 10:59 PM      Result Value Range   Creatinine, Ser 0.49 (*) 0.50 - 1.35 mg/dL   GFR calc non Af Amer >90  >90 mL/min   GFR calc Af Amer >90  >90 mL/min   Comment: (NOTE)     The eGFR has been calculated using the CKD EPI equation.     This calculation has not been validated in all clinical situations.     eGFR's persistently <90 mL/min signify possible Chronic Kidney     Disease.  COMPREHENSIVE METABOLIC PANEL     Status: Abnormal   Collection Time    11/23/13  3:43 AM      Result Value Range   Sodium 140  137 - 147 mEq/L   Potassium 3.9  3.7 - 5.3 mEq/L   Chloride 101  96 - 112 mEq/L   CO2 23  19 - 32 mEq/L   Glucose, Bld 83  70 - 99 mg/dL   BUN 4 (*) 6 - 23 mg/dL   Creatinine, Ser 0.48 (*) 0.50 - 1.35 mg/dL   Calcium 8.6  8.4 - 10.5 mg/dL   Total Protein 6.5  6.0 - 8.3 g/dL   Albumin 3.4 (*) 3.5 - 5.2 g/dL   AST 24  0 - 37 U/L   ALT 16  0 - 53 U/L   Alkaline Phosphatase 61  39 - 117 U/L   Total Bilirubin 0.4  0.3 - 1.2 mg/dL   GFR calc non Af Amer >90  >90 mL/min   GFR calc Af Amer >90  >90 mL/min   Comment: (NOTE)     The eGFR has been calculated using the CKD EPI equation.     This calculation has  not been validated in all clinical situations.     eGFR's persistently <90 mL/min signify possible Chronic Kidney     Disease.  CBC WITH DIFFERENTIAL     Status: None   Collection Time    11/23/13  3:43 AM      Result Value Range   WBC 6.3  4.0 - 10.5 K/uL   RBC 4.53  4.22 - 5.81 MIL/uL   Hemoglobin 13.3  13.0 - 17.0 g/dL   HCT 41.0  39.0 - 52.0 %   MCV 90.5  78.0 - 100.0 fL   MCH 29.4  26.0 - 34.0 pg   MCHC 32.4  30.0 - 36.0 g/dL   RDW 14.5  11.5 - 15.5 %   Platelets 336  150 - 400  K/uL   Neutrophils Relative % 45  43 - 77 %   Neutro Abs 2.9  1.7 - 7.7 K/uL   Lymphocytes Relative 45  12 - 46 %   Lymphs Abs 2.9  0.7 - 4.0 K/uL   Monocytes Relative 8  3 - 12 %   Monocytes Absolute 0.5  0.1 - 1.0 K/uL   Eosinophils Relative 1  0 - 5 %   Eosinophils Absolute 0.1  0.0 - 0.7 K/uL   Basophils Relative 0  0 - 1 %   Basophils Absolute 0.0  0.0 - 0.1 K/uL  AMMONIA     Status: None   Collection Time    11/23/13  8:25 AM      Result Value Range   Ammonia 49  11 - 60 umol/L  GLUCOSE, CAPILLARY     Status: None   Collection Time    11/23/13 12:25 PM      Result Value Range   Glucose-Capillary 98  70 - 99 mg/dL  GLUCOSE, CAPILLARY     Status: None   Collection Time    11/23/13  5:38 PM      Result Value Range   Glucose-Capillary 97  70 - 99 mg/dL  GLUCOSE, CAPILLARY     Status: Abnormal   Collection Time    11/23/13 11:55 PM      Result Value Range   Glucose-Capillary 103 (*) 70 - 99 mg/dL  MAGNESIUM     Status: None   Collection Time    11/24/13  3:05 AM      Result Value Range   Magnesium 2.2  1.5 - 2.5 mg/dL  PHOSPHORUS     Status: None   Collection Time    11/24/13  3:05 AM      Result Value Range   Phosphorus 4.5  2.3 - 4.6 mg/dL  BASIC METABOLIC PANEL     Status: Abnormal   Collection Time    11/24/13  3:05 AM      Result Value Range   Sodium 143  137 - 147 mEq/L   Potassium 3.4 (*) 3.7 - 5.3 mEq/L   Chloride 104  96 - 112 mEq/L   CO2 23  19 - 32 mEq/L   Glucose, Bld 107 (*) 70 - 99 mg/dL   BUN 7  6 - 23 mg/dL   Creatinine, Ser 0.51  0.50 - 1.35 mg/dL   Calcium 8.9  8.4 - 10.5 mg/dL   GFR calc non Af Amer >90  >90 mL/min   GFR calc Af Amer >90  >90 mL/min   Comment: (NOTE)     The eGFR has been calculated using the CKD EPI equation.     This  calculation has not been validated in all clinical situations.     eGFR's persistently <90 mL/min signify possible Chronic Kidney     Disease.  GLUCOSE, CAPILLARY     Status: Abnormal   Collection Time     11/24/13  6:10 AM      Result Value Range   Glucose-Capillary 121 (*) 70 - 99 mg/dL  GLUCOSE, CAPILLARY     Status: Abnormal   Collection Time    11/24/13 11:24 AM      Result Value Range   Glucose-Capillary 100 (*) 70 - 99 mg/dL  GLUCOSE, CAPILLARY     Status: Abnormal   Collection Time    11/24/13  6:07 PM      Result Value Range   Glucose-Capillary 105 (*) 70 - 99 mg/dL  PHOSPHORUS     Status: None   Collection Time    11/24/13  7:15 PM      Result Value Range   Phosphorus 3.4  2.3 - 4.6 mg/dL  MAGNESIUM     Status: None   Collection Time    11/24/13  7:15 PM      Result Value Range   Magnesium 2.4  1.5 - 2.5 mg/dL  GLUCOSE, CAPILLARY     Status: None   Collection Time    11/24/13 11:51 PM      Result Value Range   Glucose-Capillary 86  70 - 99 mg/dL  COMPREHENSIVE METABOLIC PANEL     Status: Abnormal   Collection Time    11/25/13  5:00 AM      Result Value Range   Sodium 146  137 - 147 mEq/L   Potassium 4.1  3.7 - 5.3 mEq/L   Comment: DELTA CHECK NOTED   Chloride 107  96 - 112 mEq/L   CO2 26  19 - 32 mEq/L   Glucose, Bld 113 (*) 70 - 99 mg/dL   BUN 10  6 - 23 mg/dL   Creatinine, Ser 0.53  0.50 - 1.35 mg/dL   Calcium 9.2  8.4 - 10.5 mg/dL   Total Protein 6.9  6.0 - 8.3 g/dL   Albumin 3.6  3.5 - 5.2 g/dL   AST 17  0 - 37 U/L   ALT 13  0 - 53 U/L   Alkaline Phosphatase 58  39 - 117 U/L   Total Bilirubin 0.3  0.3 - 1.2 mg/dL   GFR calc non Af Amer >90  >90 mL/min   GFR calc Af Amer >90  >90 mL/min   Comment: (NOTE)     The eGFR has been calculated using the CKD EPI equation.     This calculation has not been validated in all clinical situations.     eGFR's persistently <90 mL/min signify possible Chronic Kidney     Disease.  MAGNESIUM     Status: None   Collection Time    11/25/13  5:00 AM      Result Value Range   Magnesium 2.4  1.5 - 2.5 mg/dL  GLUCOSE, CAPILLARY     Status: Abnormal   Collection Time    11/25/13 11:42 AM      Result Value Range    Glucose-Capillary 101 (*) 70 - 99 mg/dL   Comment 1 Notify RN    GLUCOSE, CAPILLARY     Status: None   Collection Time    11/25/13  7:15 PM      Result Value Range   Glucose-Capillary 95  70 - 99 mg/dL  Comment 1 Notify RN    GLUCOSE, CAPILLARY     Status: None   Collection Time    11/26/13 12:35 AM      Result Value Range   Glucose-Capillary 99  70 - 99 mg/dL  GLUCOSE, CAPILLARY     Status: Abnormal   Collection Time    11/26/13  6:17 AM      Result Value Range   Glucose-Capillary 102 (*) 70 - 99 mg/dL  CBC WITH DIFFERENTIAL     Status: Abnormal   Collection Time    11/26/13  8:00 AM      Result Value Range   WBC 5.7  4.0 - 10.5 K/uL   RBC 4.76  4.22 - 5.81 MIL/uL   Hemoglobin 14.0  13.0 - 17.0 g/dL   HCT 43.0  39.0 - 52.0 %   MCV 90.3  78.0 - 100.0 fL   MCH 29.4  26.0 - 34.0 pg   MCHC 32.6  30.0 - 36.0 g/dL   RDW 14.5  11.5 - 15.5 %   Platelets 305  150 - 400 K/uL   Neutrophils Relative % 35 (*) 43 - 77 %   Neutro Abs 2.0  1.7 - 7.7 K/uL   Lymphocytes Relative 58 (*) 12 - 46 %   Lymphs Abs 3.3  0.7 - 4.0 K/uL   Monocytes Relative 7  3 - 12 %   Monocytes Absolute 0.4  0.1 - 1.0 K/uL   Eosinophils Relative 1  0 - 5 %   Eosinophils Absolute 0.1  0.0 - 0.7 K/uL   Basophils Relative 0  0 - 1 %   Basophils Absolute 0.0  0.0 - 0.1 K/uL  COMPREHENSIVE METABOLIC PANEL     Status: Abnormal   Collection Time    11/26/13  8:00 AM      Result Value Range   Sodium 144  137 - 147 mEq/L   Potassium 4.2  3.7 - 5.3 mEq/L   Chloride 105  96 - 112 mEq/L   CO2 23  19 - 32 mEq/L   Glucose, Bld 108 (*) 70 - 99 mg/dL   BUN 12  6 - 23 mg/dL   Creatinine, Ser 0.51  0.50 - 1.35 mg/dL   Calcium 9.0  8.4 - 10.5 mg/dL   Total Protein 7.0  6.0 - 8.3 g/dL   Albumin 3.6  3.5 - 5.2 g/dL   AST 20  0 - 37 U/L   ALT 16  0 - 53 U/L   Alkaline Phosphatase 56  39 - 117 U/L   Total Bilirubin 0.3  0.3 - 1.2 mg/dL   GFR calc non Af Amer >90  >90 mL/min   GFR calc Af Amer >90  >90 mL/min    Comment: (NOTE)     The eGFR has been calculated using the CKD EPI equation.     This calculation has not been validated in all clinical situations.     eGFR's persistently <90 mL/min signify possible Chronic Kidney     Disease.  PHOSPHORUS     Status: None   Collection Time    11/26/13  8:00 AM      Result Value Range   Phosphorus 4.4  2.3 - 4.6 mg/dL  GLUCOSE, CAPILLARY     Status: None   Collection Time    11/26/13 11:52 AM      Result Value Range   Glucose-Capillary 95  70 - 99 mg/dL  GLUCOSE, CAPILLARY  Status: None   Collection Time    11/26/13  5:42 PM      Result Value Range   Glucose-Capillary 92  70 - 99 mg/dL  GLUCOSE, CAPILLARY     Status: None   Collection Time    11/27/13 12:12 AM      Result Value Range   Glucose-Capillary 89  70 - 99 mg/dL   Comment 1 Notify RN     Comment 2 Documented in Chart    GLUCOSE, CAPILLARY     Status: Abnormal   Collection Time    11/27/13  6:10 AM      Result Value Range   Glucose-Capillary 111 (*) 70 - 99 mg/dL   Comment 1 Notify RN     Comment 2 Documented in Chart    GLUCOSE, CAPILLARY     Status: Abnormal   Collection Time    11/27/13  7:58 AM      Result Value Range   Glucose-Capillary 102 (*) 70 - 99 mg/dL   Comment 1 Notify RN    GLUCOSE, CAPILLARY     Status: Abnormal   Collection Time    11/27/13 12:35 PM      Result Value Range   Glucose-Capillary 110 (*) 70 - 99 mg/dL  CBC WITH DIFFERENTIAL     Status: None   Collection Time    11/29/13 12:03 PM      Result Value Range   WBC 9.0  4.0 - 10.5 K/uL   RBC 5.14  4.22 - 5.81 MIL/uL   Hemoglobin 15.7  13.0 - 17.0 g/dL   HCT 45.8  39.0 - 52.0 %   MCV 89.1  78.0 - 100.0 fL   MCH 30.5  26.0 - 34.0 pg   MCHC 34.3  30.0 - 36.0 g/dL   RDW 14.2  11.5 - 15.5 %   Platelets 304  150 - 400 K/uL   Neutrophils Relative % 66  43 - 77 %   Neutro Abs 5.9  1.7 - 7.7 K/uL   Lymphocytes Relative 26  12 - 46 %   Lymphs Abs 2.3  0.7 - 4.0 K/uL   Monocytes Relative 9  3 - 12 %    Monocytes Absolute 0.8  0.1 - 1.0 K/uL   Eosinophils Relative 0  0 - 5 %   Eosinophils Absolute 0.0  0.0 - 0.7 K/uL   Basophils Relative 0  0 - 1 %   Basophils Absolute 0.0  0.0 - 0.1 K/uL  COMPREHENSIVE METABOLIC PANEL     Status: Abnormal   Collection Time    11/29/13 12:03 PM      Result Value Range   Sodium 149 (*) 137 - 147 mEq/L   Potassium 4.2  3.7 - 5.3 mEq/L   Chloride 104  96 - 112 mEq/L   CO2 25  19 - 32 mEq/L   Glucose, Bld 110 (*) 70 - 99 mg/dL   BUN 17  6 - 23 mg/dL   Creatinine, Ser 0.65  0.50 - 1.35 mg/dL   Calcium 9.8  8.4 - 10.5 mg/dL   Total Protein 8.3  6.0 - 8.3 g/dL   Albumin 4.5  3.5 - 5.2 g/dL   AST 21  0 - 37 U/L   ALT 21  0 - 53 U/L   Alkaline Phosphatase 64  39 - 117 U/L   Total Bilirubin 0.5  0.3 - 1.2 mg/dL   GFR calc non Af Amer >90  >90 mL/min  GFR calc Af Amer >90  >90 mL/min   Comment: (NOTE)     The eGFR has been calculated using the CKD EPI equation.     This calculation has not been validated in all clinical situations.     eGFR's persistently <90 mL/min signify possible Chronic Kidney     Disease.  LIPASE, BLOOD     Status: None   Collection Time    11/29/13 12:03 PM      Result Value Range   Lipase 19  11 - 59 U/L  VALPROIC ACID LEVEL     Status: None   Collection Time    11/29/13 12:03 PM      Result Value Range   Valproic Acid Lvl 61.6  50.0 - 100.0 ug/mL  URINALYSIS, ROUTINE W REFLEX MICROSCOPIC     Status: Abnormal   Collection Time    11/29/13 12:13 PM      Result Value Range   Color, Urine AMBER (*) YELLOW   Comment: BIOCHEMICALS MAY BE AFFECTED BY COLOR   APPearance CLEAR  CLEAR   Specific Gravity, Urine 1.039 (*) 1.005 - 1.030   pH 6.5  5.0 - 8.0   Glucose, UA NEGATIVE  NEGATIVE mg/dL   Hgb urine dipstick NEGATIVE  NEGATIVE   Bilirubin Urine NEGATIVE  NEGATIVE   Ketones, ur >80 (*) NEGATIVE mg/dL   Protein, ur 30 (*) NEGATIVE mg/dL   Urobilinogen, UA 1.0  0.0 - 1.0 mg/dL   Nitrite NEGATIVE  NEGATIVE    Leukocytes, UA NEGATIVE  NEGATIVE  URINE CULTURE     Status: None   Collection Time    11/29/13 12:13 PM      Result Value Range   Specimen Description URINE, CLEAN CATCH     Special Requests NONE     Culture  Setup Time       Value: 11/29/2013 20:34     Performed at [place]       Value: NO GROWTH     Performed at [facility]   Culture       Value: NO GROWTH     Performed at [facility]   Report Status 11/30/2013 FINAL    URINE MICROSCOPIC-ADD ON     Status: None   Collection Time    11/29/13 12:13 PM      Result Value Range   WBC, UA 0-2  <3 WBC/hpf   Bacteria, UA RARE  RARE   Urine-Other MUCOUS PRESENT      Assessment/Plan: No problem-specific assessment & plan notes found for this encounter.

## 2013-12-03 NOTE — Assessment & Plan Note (Signed)
Patient agitated in office, no change in meds at present but may need adjustments if he does not calm down once he feels better.

## 2013-12-03 NOTE — Assessment & Plan Note (Signed)
Continue current regimen for now.  Follow-up with PCP in 1 week.

## 2013-12-03 NOTE — Assessment & Plan Note (Signed)
Will recheck CBC, CMP and Urinalysis.  Continue tube feedings.  Will need reassessment by Speech Therapy before PO intake can be resumed.

## 2013-12-03 NOTE — Progress Notes (Signed)
Patient ID: [name], male   DOB: 10-29-1986, 28 y.o.   MRN: [id number] [name] [id number] 07/05/1986 12/03/2013      Progress Note-Follow Up  Subjective  Chief Complaint  Chief Complaint  Patient presents with  . Follow-up    HPI  Patient is a 28 year old Caucasian male who is in today for evaluation of his PEG tube. His mom replaced his PEG tube yesterday but comes and have it evaluated. The skin around the lesion is erythematous. They have been able to get he continues to do well. He continues to be agitated and not sleeping well but his cough is improving. No fevers or new concerns. Has been cleared by ST to take some food PO again  Past Medical History  Diagnosis Date  . Cerebral palsy   . GERD (gastroesophageal reflux disease)   . Anxiety   . Depression   . Thyroid disease     hyper  . Incontinence of feces   . Palpitations   . Esophagitis   . Dehydration 11/22/2013    Past Surgical History  Procedure Laterality Date  . Spine surgery  ,11/20/2010, 2011  . Eye surgery    . Ears tubes    . Hamstring released    . Baclofen trial    . Baslofen pump implant    . Spinal fusion    . G-tube insert  August 2006  . Spinal fusioncorrect 106 degree kyphosis    . Spinal fusion to correct 70 degree kyphosis    . Tonsillectomy    . Peg placement  10/21/2011    Procedure: PERCUTANEOUS ENDOSCOPIC GASTROSTOMY (PEG) REPLACEMENT;  Surgeon: [name], MD;  Location: [facility];  Service: Endoscopy;  Laterality: N/A;  . Peg placement N/A 06/13/2013    Procedure: PERCUTANEOUS ENDOSCOPIC GASTROSTOMY (PEG) REPLACEMENT;  Surgeon: [name], MD;  Location: [facility];  Service: Endoscopy;  Laterality: N/A;    Family History  Problem Relation Age of Onset  . Asthma Mother   . Hyperlipidemia Mother   . COPD Mother   . Other Mother     bronchial stasis/ABPA  . Cancer Maternal Grandmother 26    breast  . Hyperlipidemia Maternal Grandmother   . Hypertension  Maternal Grandmother   . Cancer Maternal Grandfather     prostate  . Heart disease Paternal Grandfather     CHF  . Osteoporosis Paternal Grandmother   . Arthritis Paternal Grandmother     rheumatoid    History   Social History  . Marital Status: Single    Spouse Name: N/A    Number of Children: 0  . Years of Education: N/A   Occupational History  . disbaled    Social History Main Topics  . Smoking status: Never Smoker   . Smokeless tobacco: Never Used  . Alcohol Use: No  . Drug Use: No  . Sexual Activity: No   Other Topics Concern  . Not on file   Social History Narrative  . No narrative on file    Current Outpatient Prescriptions on File Prior to Visit  Medication Sig Dispense Refill  . AMBULATORY NON FORMULARY MEDICATION Medication Name: MIC gastrostomy/bolus feeding tube 24 French Part number 0110-24. #2 and 10 cc lurer lock syringe #2 Dx:  2 Device  1  . amoxicillin-clavulanate (AUGMENTIN) 400-57 MG/5ML suspension Take 10 mLs by mouth 2 (two) times daily.      . COD LIVER OIL PO Take 5 mLs by mouth daily.      Marland Kitchen  diazepam (VALIUM) 5 MG tablet Take 5 mg by mouth 3 (three) times daily as needed for anxiety.       . divalproex (DEPAKOTE SPRINKLE) 125 MG capsule Take 250-500 mg by mouth 2 (two) times daily. Takes 250mg  in the morning and 500mg  in the evening      . Feeding Tubes - Bags (FLEXIFLO FEEDING BAG/PUMP) MISC 1 Units by Does not apply route continuous.  1 each  30  . Feeding Tubes - Pump MISC 1 Units by Does not apply route continuous.  1 each  0  . Feeding Tubes - Sets (KANGAROO EPUMP SET 1000ML) MISC 1 Units by Does not apply route continuous.  1 each  0  . LORazepam (ATIVAN) 1 MG tablet Take 2 tablets (2 mg total) by mouth every 4 (four) hours as needed for anxiety.  30 tablet  0  . Melatonin 3 MG CAPS Take 9 mg by mouth at bedtime.      . Nutritional Supplements (FEEDING SUPPLEMENT, OSMOLITE 1.2 CAL,) LIQD Place 1,000 mLs into feeding tube daily.  1000 mL   30  . OLANZapine (ZYPREXA) 5 MG tablet Take 5 mg by mouth at bedtime.      Marland Kitchen omeprazole (PRILOSEC) 40 MG capsule Take 40 mg by mouth 2 (two) times daily.      Marland Kitchen PARoxetine (PAXIL) 30 MG tablet Take 30 mg by mouth at bedtime.      . Probiotic Product (ADVANCED PROBIOTIC 10) CAPS Give 1 capsule by tube daily.       . promethazine (PHENERGAN) 25 MG tablet Take 1 tablet (25 mg total) by mouth every 6 (six) hours as needed for nausea or vomiting.  30 tablet  0  . sucralfate (CARAFATE) 1 G tablet 1 g by PEG Tube route as needed (for stomach problems).        No current facility-administered medications on file prior to visit.    Allergies  Allergen Reactions  . Ambien [Zolpidem Tartrate] Nausea Only  . Sulfonamide Derivatives Rash    Review of Systems  Review of Systems  Constitutional: Negative for fever and malaise/fatigue.  HENT: Positive for congestion.   Eyes: Negative for discharge.  Respiratory: Positive for cough. Negative for shortness of breath.   Cardiovascular: Negative for chest pain, palpitations and leg swelling.  Gastrointestinal: Negative for nausea, abdominal pain and diarrhea.  Genitourinary: Negative for dysuria.  Musculoskeletal: Negative for falls.  Skin: Positive for rash.  Neurological: Negative for loss of consciousness and headaches.  Endo/Heme/Allergies: Negative for polydipsia.  Psychiatric/Behavioral: Negative for depression and suicidal ideas. The patient is nervous/anxious and has insomnia.     Objective  BP 108/80  Pulse 106  Temp(Src) 98.2 F (36.8 C) (Oral)  Ht 5\' 2"  (1.575 m)  SpO2 95%  Physical Exam  Physical Exam  Constitutional: He is oriented to person, place, and time and well-developed, well-nourished, and in no distress. No distress.  Patient with CP in wheelchair  HENT:  Head: Normocephalic and atraumatic.  Eyes: Conjunctivae are normal.  Neck: Neck supple. No thyromegaly present.  Cardiovascular: Normal rate, regular rhythm and  normal heart sounds.   No murmur heard. Pulmonary/Chest: Effort normal and breath sounds normal. No respiratory distress.  Abdominal: Soft. Bowel sounds are normal. He exhibits no distension and no mass. There is no tenderness.  Gastronomy tube in place with erythematous swollen skin around   Musculoskeletal: He exhibits no edema.  Neurological: He is alert and oriented to person, place, and time.  Skin: Skin is warm. Rash noted.  Psychiatric:  agitated    Lab Results  Component Value Date   TSH 0.519 11/22/2013   Lab Results  Component Value Date   WBC 9.0 11/29/2013   HGB 15.7 11/29/2013   HCT 45.8 11/29/2013   MCV 89.1 11/29/2013   PLT 304 11/29/2013   Lab Results  Component Value Date   CREATININE 0.65 11/29/2013   BUN 17 11/29/2013   NA 149* 11/29/2013   K 4.2 11/29/2013   CL 104 11/29/2013   CO2 25 11/29/2013   Lab Results  Component Value Date   ALT 21 11/29/2013   AST 21 11/29/2013   ALKPHOS 64 11/29/2013   BILITOT 0.5 11/29/2013   Lab Results  Component Value Date   CHOL 151 04/25/2010   Lab Results  Component Value Date   HDL 29.90* 04/25/2010   Lab Results  Component Value Date   LDLCALC 96 04/25/2010   Lab Results  Component Value Date   TRIG 125.0 04/25/2010   Lab Results  Component Value Date   CHOLHDL 5 04/25/2010     Assessment & Plan  ASPIRATION PNEUMONIA Cough is improving. Speech Therapy was out to house and she has cleared him for some po intake again per [facility] office [phone], [place] Has had increased agitation, insomnia since developing pneumonia recently. Have completed paperwork and faxed to [facility] fax [phone] to help family with night time nursing.  Gastrostomy tube skin breakdown Skin appears to have a yeast infection, given Diflucan and Nystatin cream.  Generalized anxiety disorder Patient agitated in office, no change in meds at present but may need adjustments if he does not calm down once he  feels better.

## 2013-12-03 NOTE — Assessment & Plan Note (Signed)
Skin appears to have a yeast infection, given Diflucan and Nystatin cream.

## 2013-12-03 NOTE — Assessment & Plan Note (Signed)
Has had increased agitation, insomnia since developing pneumonia recently. Have completed paperwork and faxed to [facility] fax [phone] to help family with night time nursing.

## 2013-12-03 NOTE — Assessment & Plan Note (Addendum)
Referral to Neurology placed due to increased agitation and need for follow-up. Patient to continue current medication regimen for agitation.

## 2013-12-04 ENCOUNTER — Telehealth: Payer: Self-pay | Admitting: *Deleted

## 2013-12-04 ENCOUNTER — Telehealth (HOSPITAL_COMMUNITY): Payer: Self-pay | Admitting: Radiology

## 2013-12-04 NOTE — Telephone Encounter (Signed)
pts mother states she will check into this and call us back and let us know

## 2013-12-04 NOTE — Telephone Encounter (Signed)
Becky returned my call wanting to know if order is for 24 hour care to administer nutrition. If so, she states that would need to come from a [place] instead of home health. She states they can go out 1 time to teach care giver and or have dietician to do a telephone assessment to check on pt's status and see if further recommendations are needed otherwise they do not administer the nutrition themselves beyond the teaching phase. Please advise.

## 2013-12-04 NOTE — Telephone Encounter (Signed)
So it is to administer feeds at night. So I have never ordered a private duty nurse we will need to check with family and see if they have a company in mind and I will use them. If they do not they should check and see which company their insurance will cover and I will use them

## 2013-12-04 NOTE — Telephone Encounter (Signed)
Received call from [place], RN with [facility] requesting clarification of order for enteral nutrition or 24hour? Also wanted to know if we wanted them to do teaching with the caregiver on proper care for pt?  Left message for Jacqlyn Larsen to return my call to clarify above request.

## 2013-12-05 ENCOUNTER — Telehealth: Payer: Self-pay

## 2013-12-05 ENCOUNTER — Telehealth: Payer: Self-pay | Admitting: Family Medicine

## 2013-12-05 MED ORDER — DIVALPROEX SODIUM 125 MG PO CPSP: 250.0000 mg | ORAL_CAPSULE | Freq: Two times a day (BID) | ORAL | Status: DC

## 2013-12-05 NOTE — Telephone Encounter (Signed)
refill-depakote 125mg 

## 2013-12-05 NOTE — Telephone Encounter (Signed)
Written prescription provided for faxing.

## 2013-12-05 NOTE — Telephone Encounter (Signed)
[name] states he needs an RX for a portable suction unit- ACDC device? Reference # for this is [id number]  Please advise?

## 2013-12-05 NOTE — Telephone Encounter (Signed)
I called and spoke to patients mom, to see what all services were needed for the patient (for the home health care). Pts mom states that she decided that since pt is doing so much better and eating now she does not wish to continue at this time with the extra help at night.

## 2013-12-06 ENCOUNTER — Telehealth: Payer: Self-pay

## 2013-12-06 NOTE — Telephone Encounter (Signed)
Check other notes. I spoke to patients mother and she stated she would not like to proceed with this at the present time due to her son doing so well

## 2013-12-06 NOTE — Telephone Encounter (Signed)
OK to give order for barium swallow due to cp and dysphaghia

## 2013-12-06 NOTE — Telephone Encounter (Signed)
Merry Proud assessed patient to see if patient could receive botox in hip for pain relief and help with transfer.  Per Merry Proud he thinks patient would benefit.  Patient did well with other botox.  Patient does not need home therapy per Merry Proud.  Please advise if botox appointment needs to be made.

## 2013-12-06 NOTE — Telephone Encounter (Signed)
[name] w/[facility] left a message stating that she would like to schedule a modified barium swallow study to see how he is swallowing. [name] stated that she would go ahead and schedule this and send the paperwork over for Dr Charlett Blake to sign.  I left a message on [name]'s machine stating that I'm sure Dr Charlett Blake would be ok with this, HOWEVER Dr Charlett Blake is out of the office until 12-14-13 so we won't be able to sign or give a verbal ok yet.

## 2013-12-06 NOTE — Telephone Encounter (Signed)
RX faxed

## 2013-12-06 NOTE — Telephone Encounter (Signed)
Merry Proud @ [facility] is requesting a call back. Patient's family has questions about possible Botox injections for the patient. Attempted to reach Merry Proud to answer questions, left a voicemail for him to contact clinic back.

## 2013-12-07 ENCOUNTER — Other Ambulatory Visit (HOSPITAL_COMMUNITY): Payer: Self-pay | Admitting: Family Medicine

## 2013-12-07 DIAGNOSIS — R1310 Dysphagia, unspecified: Secondary | ICD-10-CM

## 2013-12-07 NOTE — Telephone Encounter (Signed)
Ebony Hail w/ pts speech therapy left a message stating that pts procedure is 12-18-12. Ebony Hail just wants md to make sure to sign paperwork when she gets back from vacation.   Paperwork is on [employer]

## 2013-12-07 NOTE — Telephone Encounter (Signed)
This patient has been evaluated already regarding LE botox. I am happy to review again at his follow up visit. 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

## 2013-12-07 NOTE — Telephone Encounter (Signed)
They are going to do this procedure. They will just have to wait until MD returns for paperwork order to be signed

## 2013-12-08 ENCOUNTER — Encounter: Payer: Self-pay | Admitting: Physical Medicine & Rehabilitation

## 2013-12-08 ENCOUNTER — Other Ambulatory Visit: Payer: Self-pay | Admitting: Physician Assistant

## 2013-12-08 ENCOUNTER — Encounter
Payer: Managed Care, Other (non HMO) | Attending: Physical Medicine & Rehabilitation | Admitting: Physical Medicine & Rehabilitation

## 2013-12-08 VITALS — Resp 14

## 2013-12-08 DIAGNOSIS — G243 Spasmodic torticollis: Secondary | ICD-10-CM

## 2013-12-08 DIAGNOSIS — G8250 Quadriplegia, unspecified: Secondary | ICD-10-CM

## 2013-12-08 DIAGNOSIS — G808 Other cerebral palsy: Secondary | ICD-10-CM | POA: Insufficient documentation

## 2013-12-08 MED ORDER — DANTROLENE SODIUM 25 MG PO CAPS: 25.0000 mg | ORAL_CAPSULE | Freq: Three times a day (TID) | ORAL | Status: DC

## 2013-12-08 NOTE — Telephone Encounter (Signed)
Ok to send #30 with zero refills. Please contact mother.  Is he taking valium also?  I don't want him to take both together.

## 2013-12-08 NOTE — Patient Instructions (Signed)
TAKE DANTRIUM ONE PILL AT NIGHT FOR 5 DAYS THEN ONE TWICE DAILY FOR 5 DAYS THEN ONE THREE X DAILY THEREAFTER

## 2013-12-08 NOTE — Progress Notes (Signed)
Subjective:    Patient ID: [name], male    DOB: 14-Mar-1986, 28 y.o.   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 He had excellent results with the botox to his traps and SCM at last visit. The effects of these seemed to rapidly wear off in mid to late January. He missed an appt with me due to a respiratory illness and hospitalization.  He has been having more extensor spasms in the lower extremities, particularly the quads which make it difficult to transfer and to keep positioning while in his wheel chair. He has tried a baclofen pump in the past with poor results.     Pain Inventory Average Pain 0 Pain Right Now 10 My pain is constant, stabbing and spastic tight  In the last 24 hours, has pain interfered with the following? General activity 0 Relation with others 0 Enjoyment of life 0 What TIME of day is your pain at its worst? hurts all day Sleep (in general) Poor  Pain is worse with: n/a Pain improves with: n/a Relief from Meds: 2  Mobility use a wheelchair Do you have any goals in this area?  yes  Function disabled: date disabled .  Neuro/Psych bladder control problems spasms confusion anxiety  Prior Studies x-rays CT/MRI  Physicians involved in your care Dr [name]   Family History  Problem Relation Age of Onset  . Asthma Mother   . Hyperlipidemia Mother   . COPD Mother   . Other Mother     bronchial stasis/ABPA  . Cancer Maternal Grandmother 1    breast  . Hyperlipidemia Maternal Grandmother   . Hypertension Maternal Grandmother   . Cancer Maternal Grandfather     prostate  . Heart disease Paternal Grandfather     CHF  . Osteoporosis Paternal Grandmother   . Arthritis Paternal Grandmother     rheumatoid   History   Social History  . Marital Status: Single    Spouse Name: N/A    Number of Children: 0  . Years of Education: N/A   Occupational History  . disbaled    Social History  Main Topics  . Smoking status: Never Smoker   . Smokeless tobacco: Never Used  . Alcohol Use: No  . Drug Use: No  . Sexual Activity: No   Other Topics Concern  . None   Social History Narrative  . None   Past Surgical History  Procedure Laterality Date  . Spine surgery  ,11/20/2010, 2011  . Eye surgery    . Ears tubes    . Hamstring released    . Baclofen trial    . Baslofen pump implant    . Spinal fusion    . G-tube insert  August 2006  . Spinal fusioncorrect 106 degree kyphosis    . Spinal fusion to correct 70 degree kyphosis    . Tonsillectomy    . Peg placement  10/21/2011    Procedure: PERCUTANEOUS ENDOSCOPIC GASTROSTOMY (PEG) REPLACEMENT;  Surgeon: [name], MD;  Location: [facility];  Service: Endoscopy;  Laterality: N/A;  . Peg placement N/A 06/13/2013    Procedure: PERCUTANEOUS ENDOSCOPIC GASTROSTOMY (PEG) REPLACEMENT;  Surgeon: [name], MD;  Location: [facility];  Service: Endoscopy;  Laterality: N/A;   Past Medical History  Diagnosis Date  . Cerebral palsy   . GERD (gastroesophageal reflux disease)   . Anxiety   . Depression   . Thyroid disease  hyper  . Incontinence of feces   . Palpitations   . Esophagitis   . Dehydration 11/22/2013   BP   Pulse   Resp 14  SpO2 %  Opioid Risk Score:   Fall Risk Score: Low Fall Risk (0-5 points) (family educated handout declined)   Review of Systems  Constitutional: Positive for appetite change and unexpected weight change.  Respiratory: Positive for shortness of breath, wheezing and stridor.   Gastrointestinal: Positive for constipation.  Genitourinary: Positive for dysuria and difficulty urinating.  Psychiatric/Behavioral: Positive for confusion. The patient is nervous/anxious.   All other systems reviewed and are negative.       Objective:   Physical Exam General: Alert and oriented x 3, No apparent distress. He is sitting in a motorized chair with head rest and trunk supports. He makes  good eye contact  HEENT: Head is normocephalic, atraumatic, PERRLA, EOMI, sclera anicteric, oral mucosa pink and moist, dentition intact, ext ear canals clear,  Neck: Supple without JVD or lymphadenopathy  Heart: Reg rate and rhythm. No murmurs rubs or gallops  Chest: CTA bilaterally without wheezes, rales, or rhonchi; no distress  Abdomen: Soft, non-tender, non-distended, bowel sounds positive.  Extremities: No clubbing, cyanosis, or edema. Pulses are 2+  Skin: Clean and intact without signs of breakdown although there is an area of red/demarcated macular rash around the tube site.  Neuro: pt able to respond to simple questions and answers, but speech is very dysarthric. He has decreased oralmotor control. He has some movement of his cervical musculature but is limited to his fusion and pain as well. Tone in the UE is 4/4 pec major/minor, 3/4 bicep and wrist/HI--perhaps even a little more than that. He had fluctuating tone in his hamstrings, quads, HAD's at 2-3/4. When supine he was able to lay surprisingly flat. Heel cords were tight as well at 3/4. Pt with minimal volitional movement on exam. He can sense pain in the trunk and extremities  Musculoskeletal: low back, trunk and cervical ROM is limited. He has some kyphosis. His neck tends to devitate to the right and rotates left. Both traps and SCM's are tender to touch and are tight, particularly the traps. He has tenderness with palpation of the lower lumbar spine---this re-produced his pain which he complains of at night. He had some tenderness in the hips with rotation and FABER's testing, but this was not the main pain he was here for.  Psych: Pt's affect is appropriate. Pt is cooperative given his cognitive linguistic issues.           Assessment & Plan:  Botox Injection for spasticity using needle EMG guidance Indication: spastic tetraplegia/torticollis   Dilution: 100 Units/ml        Total Units Injected: 200 Indication: Severe  spasticity which interferes with ADL,mobility and/or  hygiene and is unresponsive to medication management and other conservative care Informed consent was obtained after describing risks and benefits of the procedure with the patient. This includes bleeding, bruising, infection, excessive weakness, or medication side effects. A REMS form is on file and signed.  Needle: 13mm injectable monopolar needle electrode  Number of units per muscle Left trap: 100u  4 ACCESS POINTS Left SCM: 50 u 2 ACCESS POINTS Right trap: 50 u  2 ACCESS POINTS All injections were done after obtaining appropriate EMG activity and after negative drawback for blood. The patient tolerated the procedure well. Post procedure instructions were given. A followup appointment was made.   We will seek approval  for botox injections for his bilateral quadriceps and hip adductors as these appear to affect hygiene and positioning the most. I would recommend 400 units in total to these muscle groups.

## 2013-12-08 NOTE — Telephone Encounter (Signed)
eScribe request from [facility] for refill on Lorazepam 1 mg Last filled - 01.29.15, #30x0 Last AEX - 01.30.15 Next AEX - 3 Weeks Please Advise on refills in Dr. Frederik Pear absence/SLS

## 2013-12-08 NOTE — Telephone Encounter (Signed)
Refill called to pharmacist (?Ronalee Belts) at [facility] as below. Left detailed message on mother's voicemail to remember not to give lorazepam and valium together at the same time and to call if any questions.

## 2013-12-11 ENCOUNTER — Ambulatory Visit: Payer: Managed Care, Other (non HMO) | Admitting: Adult Health

## 2013-12-11 ENCOUNTER — Ambulatory Visit (INDEPENDENT_AMBULATORY_CARE_PROVIDER_SITE_OTHER): Payer: Medicare Other | Admitting: Neurology

## 2013-12-11 ENCOUNTER — Telehealth: Payer: Self-pay | Admitting: Neurology

## 2013-12-11 ENCOUNTER — Encounter: Payer: Self-pay | Admitting: Neurology

## 2013-12-11 VITALS — BP 90/60 | HR 68 | Temp 97.0°F | Resp 16 | Ht 62.0 in | Wt 107.0 lb

## 2013-12-11 DIAGNOSIS — F919 Conduct disorder, unspecified: Secondary | ICD-10-CM | POA: Diagnosis not present

## 2013-12-11 DIAGNOSIS — R259 Unspecified abnormal involuntary movements: Secondary | ICD-10-CM | POA: Diagnosis not present

## 2013-12-11 DIAGNOSIS — R252 Cramp and spasm: Secondary | ICD-10-CM

## 2013-12-11 DIAGNOSIS — IMO0002 Reserved for concepts with insufficient information to code with codable children: Secondary | ICD-10-CM

## 2013-12-11 DIAGNOSIS — F411 Generalized anxiety disorder: Secondary | ICD-10-CM

## 2013-12-11 MED ORDER — DIVALPROEX SODIUM 125 MG PO CPSP: ORAL_CAPSULE | ORAL | Status: DC

## 2013-12-11 NOTE — Telephone Encounter (Signed)
At check out, patient's mother asked [place] to confirm who is the best suited to see this patient d/t his CP. We scheduled him w/ Dr. Delice Lesch b/c she had the first available. The pt and mother both liked Dr. Delice Lesch but wanted to make sure between our providers who is best suited for his needed. Pt will need a follow up in 4 months, we will hold off on this pending Dr. Doristine Devoid review / Venida Jarvis

## 2013-12-11 NOTE — Patient Instructions (Signed)
1. Increase Depakote sprinkles 125mg  capsules to 2 caps in AM, 5 caps in PM 2. Continue all other medications for now 3. Contact [facility] for appointment in May 4. Follow-up in 4 months, call for any problems

## 2013-12-11 NOTE — Telephone Encounter (Signed)
Dr. Delice Lesch is well qualified to take care of him.

## 2013-12-11 NOTE — Progress Notes (Signed)
NEUROLOGY CONSULTATION NOTE  [name] MRN: [id number] DOB: 1986-08-09  Referring provider: Leeanne Rio PA-C Primary care provider: Dr. Willette Alma  Reason for consult:  Neurologic followup, medication management   Thank you for your kind referral of [name] for consultation of the above symptoms. Although his history is well known to you, please allow me to reiterate it for the purpose of our medical record. The patient was accompanied to the clinic by his who provides majority of information. Records from his other physicians have been reviewed.  HISTORY OF PRESENT ILLNESS: This is a pleasant 28 year old man with a history of spastic cerebral palsy.  He has had a long history of spasticity affecting his neck, trunk, and limbs.  His mother reports Baclofen pump placement in 1999, then eventual removal in 2011.  He has had spinal fusion surgeries from the mid thoracic spine to C2 levels approximately, as well as lumbar fusion surgery.  He has been having significant back pain, with sleep difficulties.  His mother also expressed concern about personality changes that she attributes to the baclofen pump.  She reports that he had always been a happy, joyful, smiling person, however over the past few years, he started having behavioral changes and significant anxiety.  He has been taking Pzxil for many years.  He would be awake for 48 to 72 hours, crying, saying "not me."  He had seen several physicians at [facility], [place], and [place], until he saw a physician in [place] then Dr. Gaynell Face, who was able to wean him off the baclofen pump until it was removed in 2011.  He however continued to have the episodes of anxiety and behavioral changes, occurring every 6 to 8 weeks, lasting 48 to 72 hours.  He had been following with [facility] until he had some insurance changes.  He was started on Depakote sprinkles, which did help with these episodes.  He had also been taking  diazepam 5mg  1-2x/day for the past 28 years, which was stopped during a recent admission for dehydration and aspiration pneumonia on 11/30/13.  He has been taking Ativan 2mg  qhs since then, which seems to help with the anxiety attacks.  He has been receiving Botox injections, which have significantly helped with the spasticity and pain.    Records and images were personally reviewed where available.   Laboratory Data: Lab Results  Component Value Date   WBC 9.0 11/29/2013   HGB 15.7 11/29/2013   HCT 45.8 11/29/2013   MCV 89.1 11/29/2013   PLT 304 11/29/2013     Chemistry      Component Value Date/Time   NA 149* 11/29/2013 1203   K 4.2 11/29/2013 1203   CL 104 11/29/2013 1203   CO2 25 11/29/2013 1203   BUN 17 11/29/2013 1203   CREATININE 0.65 11/29/2013 1203      Component Value Date/Time   CALCIUM 9.8 11/29/2013 1203   ALKPHOS 64 11/29/2013 1203   AST 21 11/29/2013 1203   ALT 21 11/29/2013 1203   BILITOT 0.5 11/29/2013 1203     Lab Results  Component Value Date   VALPROATE 61.6 11/29/2013     PAST MEDICAL HISTORY: Past Medical History  Diagnosis Date  . Cerebral palsy   . GERD (gastroesophageal reflux disease)   . Anxiety   . Depression   . Thyroid disease     hyper  . Incontinence of feces   . Palpitations   . Esophagitis   .  Dehydration 11/22/2013    PAST SURGICAL HISTORY: Past Surgical History  Procedure Laterality Date  . Spine surgery  ,11/20/2010, 2011  . Eye surgery    . Ears tubes    . Hamstring released    . Baclofen trial    . Baslofen pump implant    . Spinal fusion    . G-tube insert  August 2006  . Spinal fusioncorrect 106 degree kyphosis    . Spinal fusion to correct 70 degree kyphosis    . Tonsillectomy    . Peg placement  10/21/2011    Procedure: PERCUTANEOUS ENDOSCOPIC GASTROSTOMY (PEG) REPLACEMENT;  Surgeon: [name], MD;  Location: [facility];  Service: Endoscopy;  Laterality: N/A;  . Peg placement N/A 06/13/2013    Procedure: PERCUTANEOUS  ENDOSCOPIC GASTROSTOMY (PEG) REPLACEMENT;  Surgeon: [name], MD;  Location: [facility];  Service: Endoscopy;  Laterality: N/A;    MEDICATIONS: Current Outpatient Prescriptions on File Prior to Visit  Medication Sig Dispense Refill  . AMBULATORY NON FORMULARY MEDICATION Medication Name: MIC gastrostomy/bolus feeding tube 24 French Part number 0110-24. #2 and 10 cc lurer lock syringe #2 Dx:  2 Device  1  . COD LIVER OIL PO Take 5 mLs by mouth daily.      . dantrolene (DANTRIUM) 25 MG capsule Take 1 capsule (25 mg total) by mouth 3 (three) times daily.  90 capsule  3  . Feeding Tubes - Bags (FLEXIFLO FEEDING BAG/PUMP) MISC 1 Units by Does not apply route continuous.  1 each  30  . Feeding Tubes - Pump MISC 1 Units by Does not apply route continuous.  1 each  0  . Feeding Tubes - Sets (KANGAROO EPUMP SET 1000ML) MISC 1 Units by Does not apply route continuous.  1 each  0  . fluconazole (DIFLUCAN) 40 MG/ML suspension 5 ml per GT daily x 3 dasy then 5 ml weekly x 3 weeks then weekly as needed.  100 mL  1  . LORazepam (ATIVAN) 1 MG tablet TAKE 2 TABLETS BY MOUTH EVERY 4 HOURS AS NEEDED FOR ANXIETY.  30 tablet  0  . Melatonin 3 MG CAPS Take 9 mg by mouth at bedtime.      . Nutritional Supplements (FEEDING SUPPLEMENT, OSMOLITE 1.2 CAL,) LIQD Place 1,000 mLs into feeding tube daily.  1000 mL  30  . nystatin cream (MYCOSTATIN) Apply 1 application topically 2 (two) times daily as needed for dry skin.  30 g  1  . OLANZapine (ZYPREXA) 5 MG tablet Take 5 mg by mouth at bedtime.      Marland Kitchen omeprazole (PRILOSEC) 40 MG capsule Take 40 mg by mouth 2 (two) times daily.      Marland Kitchen PARoxetine (PAXIL) 30 MG tablet Take 30 mg by mouth at bedtime.      . Probiotic Product (ADVANCED PROBIOTIC 10) CAPS Give 1 capsule by tube daily.       . promethazine (PHENERGAN) 25 MG tablet Take 1 tablet (25 mg total) by mouth every 6 (six) hours as needed for nausea or vomiting.  30 tablet  0  . sucralfate (CARAFATE) 1 G tablet 1 g  by PEG Tube route as needed (for stomach problems).        No current facility-administered medications on file prior to visit.    ALLERGIES: Allergies  Allergen Reactions  . Ambien [Zolpidem Tartrate] Nausea Only  . Codeine   . Sulfonamide Derivatives Rash    FAMILY HISTORY: Family History  Problem Relation  Age of Onset  . Asthma Mother   . Hyperlipidemia Mother   . COPD Mother   . Other Mother     bronchial stasis/ABPA  . Cancer Maternal Grandmother 93    breast  . Hyperlipidemia Maternal Grandmother   . Hypertension Maternal Grandmother   . Cancer Maternal Grandfather     prostate  . Heart disease Paternal Grandfather     CHF  . Osteoporosis Paternal Grandmother   . Arthritis Paternal Grandmother     rheumatoid    SOCIAL HISTORY: History   Social History  . Marital Status: Single    Spouse Name: N/A    Number of Children: 0  . Years of Education: N/A   Occupational History  . disbaled    Social History Main Topics  . Smoking status: Never Smoker   . Smokeless tobacco: Never Used  . Alcohol Use: No  . Drug Use: No  . Sexual Activity: No   Other Topics Concern  . Not on file   Social History Narrative  . No narrative on file    REVIEW OF SYSTEMS: as per mother Constitutional: No fevers, chills, or sweats, no generalized fatigue, change in appetite Eyes: No visual changes, double vision, eye pain Ear, nose and throat: No hearing loss, ear pain, nasal congestion, sore throat Cardiovascular: No chest pain, palpitations Respiratory:  No shortness of breath at rest or with exertion, wheezes GastrointestinaI: No nausea, vomiting, diarrhea, abdominal pain, fecal incontinence Genitourinary:  No dysuria Musculoskeletal:  +back pain Integumentary: No rash, pruritus, skin lesions Neurological: as above Psychiatric: +insomnia, anxiety Endocrine: No palpitations, fatigue, diaphoresis, mood swings, change in appetite, change in weight, increased  thirst Hematologic/Lymphatic:  No anemia, purpura, petechiae. Allergic/Immunologic: no itchy/runny eyes, nasal congestion, recent allergic reactions, rashes  PHYSICAL EXAM: Filed Vitals:   12/11/13 1339  BP: 90/60  Pulse: 68  Temp: 97 F (36.1 C)  Resp: 16   General: No acute distress, smiling, wheelchair-bound Head:  Normocephalic/atraumatic Neck: limited movement due to fusion, deviated to the right Back: limited ROM Heart: regular rate and rhythm Lungs: Clear to auscultation bilaterally. Vascular: No carotid bruits. Skin/Extremities: No rash, no edema Neurological Exam: Mental status: communicates using communication board, very dysarthric able to say yes and no.   Cranial nerves: CN I: not tested CN II: pupils equal, round and reactive to light, visual fields intact CN III, IV, VI:  full range of motion, left-beating nystagmus on left gaze.  no ptosis CN V: facial sensation intact CN VII: upper and lower face symmetric CN VIII: hearing intact CN IX, X: gag intact, uvula midline CN XI: sternocleidomastoid and trapezius muscles tight CN XII: tongue midline Bulk & Tone: significantly increased tone with spastic quadriplegia Motor: minimal movement of both UE, none noted on both LE Sensation: intact to light touch, cold,  Deep Tendon Reflexes: brisk +3 throughout, with sustained clonus on the left ankle Plantar responses: mute Finger to nose testing: unable to perform Gait: not tested  IMPRESSION: This is a 28 year old man with a history of cerebral palsy with spastic quadriplegia presenting for medication management. His mother brings a list of questions today, her main concern is help with psychiatry medications, including Paxil, Zyprexa, and Depakote. He has been having increasing anxiety attacks for the past year, as well as sleep issues. They expressed concern about the increased aspiration pneumonia, being told that this was seen in geriatric populations with dementia,  which was very concerning for them. With the history obtained, the cause  of increased aspiration is unclear.  The anxiety episodes appear to be related to behavioral and anxiety issues, which will be a better addressed with a psychiatrist, as he had previously been seeing in the past. At this time, we have agreed to slightly increase his Depakote sprinkles 125 mg to 2 capsules in the morning, 5 capsules at bedtime. We discussed benzodiazepine use, with the diazepam to be used instead of Ativan for the spasticity. He will continue with the Botox injections, which have significantly helped. We discussed using Ativan only as needed the for the anxiety attacks. His mother is agreeable to this, and she will schedule him for follow up with the neuropsychologist the that he was seeing in the past.  He will follow-up in 4 months, if they have difficulties establishing care with the psychiatrist.    Thank you for allowing me to participate in the care of this patient. Please do not hesitate to call for any questions or concerns.   Ellouise Newer, M.D.

## 2013-12-13 NOTE — Telephone Encounter (Signed)
Left Merry Proud @ [facility] a voicemail to return call to clinic to inform him that Dr. Naaman Plummer would be happy to reevaluate the patient at his follow up visit, but his severe contractures will limit the efficacy of botox as he has discussed with family and patient before on many occasions.

## 2013-12-18 ENCOUNTER — Other Ambulatory Visit (HOSPITAL_COMMUNITY): Payer: Managed Care, Other (non HMO)

## 2013-12-18 ENCOUNTER — Ambulatory Visit (HOSPITAL_COMMUNITY): Payer: Managed Care, Other (non HMO)

## 2013-12-19 ENCOUNTER — Inpatient Hospital Stay (HOSPITAL_COMMUNITY): Admission: RE | Admit: 2013-12-19 | Payer: Managed Care, Other (non HMO) | Source: Ambulatory Visit

## 2013-12-19 ENCOUNTER — Ambulatory Visit (HOSPITAL_COMMUNITY): Payer: Managed Care, Other (non HMO)

## 2013-12-20 ENCOUNTER — Telehealth: Payer: Self-pay

## 2013-12-20 DIAGNOSIS — E875 Hyperkalemia: Secondary | ICD-10-CM

## 2013-12-20 NOTE — Telephone Encounter (Signed)
Lab order placed.

## 2013-12-20 NOTE — Telephone Encounter (Signed)
Spoke w/ mom. She is aware of Dr. Doristine Devoid response and agreeable to continued care w/ Dr. Delice Lesch / Venida Jarvis

## 2013-12-20 NOTE — Telephone Encounter (Signed)
Per MD let pts mother know that insurance is not wanting to cover Lorazepam. MD wants pts mother to check with insurance and pharmacy to see if they cover any similar medication. And to call us back with this information and we will send the medication to the pharmacy.

## 2013-12-22 ENCOUNTER — Other Ambulatory Visit: Payer: Self-pay | Admitting: Family Medicine

## 2013-12-22 ENCOUNTER — Ambulatory Visit (HOSPITAL_COMMUNITY)
Admission: RE | Admit: 2013-12-22 | Discharge: 2013-12-22 | Disposition: A | Discharge status: Home / Self Care | Payer: Managed Care, Other (non HMO) | Source: Ambulatory Visit | Attending: Family Medicine | Admitting: Family Medicine

## 2013-12-22 DIAGNOSIS — R1310 Dysphagia, unspecified: Secondary | ICD-10-CM

## 2013-12-22 DIAGNOSIS — R1313 Dysphagia, pharyngeal phase: Secondary | ICD-10-CM | POA: Insufficient documentation

## 2013-12-22 NOTE — Procedures (Signed)
Objective Swallowing Evaluation: Modified Barium Swallowing Study  Patient Details  Name: [name] MRN: [id number] Date of Birth: May 01, 1986  Today's Date: 12/22/2013 Time: 1130-1215 SLP Time Calculation (min): 45 min  Past Medical History:  Past Medical History  Diagnosis Date  . Cerebral palsy   . GERD (gastroesophageal reflux disease)   . Anxiety   . Depression   . Thyroid disease     hyper  . Incontinence of feces   . Palpitations   . Esophagitis   . Dehydration 11/22/2013   Past Surgical History:  Past Surgical History  Procedure Laterality Date  . Spine surgery  ,11/20/2010, 2011  . Eye surgery    . Ears tubes    . Hamstring released    . Baclofen trial    . Baslofen pump implant    . Spinal fusion    . G-tube insert  August 2006  . Spinal fusioncorrect 106 degree kyphosis    . Spinal fusion to correct 70 degree kyphosis    . Tonsillectomy    . Peg placement  10/21/2011    Procedure: PERCUTANEOUS ENDOSCOPIC GASTROSTOMY (PEG) REPLACEMENT;  Surgeon: [name], MD;  Location: [facility];  Service: Endoscopy;  Laterality: N/A;  . Peg placement N/A 06/13/2013    Procedure: PERCUTANEOUS ENDOSCOPIC GASTROSTOMY (PEG) REPLACEMENT;  Surgeon: [name], MD;  Location: [facility];  Service: Endoscopy;  Laterality: N/A;   HPI:  28 y.o. male with history of cerebral palsy with spastic quadriplegia referred for OPMBS.  Hx includes anxiety, posterior cervical spinal fusion in 9/11 and 1/12 to correct kyphosis.  Per chart, swallowing difficulties were exacerbated after fusion.  Pt has had a PEG for years.  He has had fluctuating degrees of dysphagia with chronic aspiration.  Multiple MBS studies have been completed, confirming varying levels of dysphagia but generally noting an ataxic swallow with significant oral phase involvement (lingual pumping, discoordinated timing,) and trace aspiration of thin and nectar-thick liquids.  Symptoms are consistent with his dx of CP.    Pt's mother and caregiver present for study today.  Mrs. Ludtke demonstrates a good understanding of her son's swallowing issues.  She is perceptive to subtle s/s of aspiration and is competent in managing his PO intake and safety.  Pt has had intermittent SLP treatment to assist with dysphagia issues.       Assessment / Plan / Recommendation Clinical Impression  Dysphagia Diagnosis: Moderate oral phase dysphagia;Moderate pharyngeal phase dysphagia  Clinical impression:  Pt presents with swallow function that is generally consistent with prior studies.  Oral phase is marked by ataxic motor control, with improvements in overall coordination/control noted as study progressed.  Pharyngeal anatomy is characterized by limited pyriform sinus space and patent opening through larynx to upper airway.  Once pureed bolus material reaches vallecular space, it tends to reside there, then is cleared by multiple f/u swallows.  Thin and nectar-thick liquids tend to penetrate to the level of the vocal cords due to discoordinated laryngeal closure and limited pyriform space.  Trace amounts do not elicit a spontaneous cough; larger amounts near cords do elicit a protective cough response.    Overall,  function appears stable.  Recommend resuming POs - purees, thin liquids in small bolus sizes - with careful hand-feeding and attention to s/s of aspiration; cease feeding when aspiration signs are present.  Pt's mother verbalizes info; she assisted with feeding her son during the study and collaborated in decision-making, discussion of video and results.  She understands chronicity of aspiration and our inability to prevent it, only minimize it.    Treatment Recommendation  Defer treatment plan to SLP at (Comment)    Diet Recommendation Dysphagia 1 (Puree);Thin liquid   Liquid Administration via: Spoon;Cup Medication Administration: Via alternative means Supervision: Trained caregiver to feed patient Compensations:  Slow rate;Small sips/bites;Clear throat intermittently Postural Changes and/or Swallow Maneuvers: Seated upright 90 degrees    Other  Recommendations Oral Care Recommendations: Oral care BID   Follow Up Recommendations  Home health SLP        General Type of Study: Modified Barium Swallowing Study Reason for Referral: Objectively evaluate swallowing function Previous Swallow Assessment: multiple studies Diet Prior to this Study: PEG tube;Dysphagia 1 (puree) Temperature Spikes Noted: No Respiratory Status: Room air History of Recent Intubation: No Behavior/Cognition: Alert;Cooperative;Pleasant mood Oral Cavity - Dentition: Adequate natural dentition Oral Motor / Sensory Function: Impaired motor Self-Feeding Abilities: Total assist Patient Positioning: Upright in bed Baseline Vocal Quality: Clear Volitional Cough: Cognitively unable to elicit Volitional Swallow: Unable to elicit Anatomy:  (minimal pyriform sinus space) Pharyngeal Secretions: Not observed secondary MBS    Reason for Referral Objectively evaluate swallowing function   Oral Phase Oral Preparation/Oral Phase Oral Phase: Impaired Oral - Nectar Oral - Nectar Cup: Impaired mastication;Weak lingual manipulation;Lingual pumping;Reduced posterior propulsion Oral - Thin Oral - Thin Cup: Impaired mastication;Weak lingual manipulation;Lingual pumping;Reduced posterior propulsion Oral - Solids Oral - Puree: Impaired mastication;Weak lingual manipulation;Lingual pumping;Reduced posterior propulsion   Pharyngeal Phase Pharyngeal Phase Pharyngeal Phase: Impaired Pharyngeal - Nectar Pharyngeal - Nectar Cup: Delayed swallow initiation;Premature spillage to pyriform sinuses;Reduced laryngeal elevation;Reduced anterior laryngeal mobility;Reduced airway/laryngeal closure;Reduced tongue base retraction;Penetration/Aspiration during swallow;Penetration/Aspiration after swallow;Trace aspiration Penetration/Aspiration details (nectar  cup): Material enters airway, CONTACTS cords and not ejected out Pharyngeal - Thin Pharyngeal - Thin Cup: Delayed swallow initiation;Premature spillage to pyriform sinuses;Reduced laryngeal elevation;Reduced anterior laryngeal mobility;Reduced airway/laryngeal closure;Reduced tongue base retraction;Penetration/Aspiration during swallow;Penetration/Aspiration after swallow;Trace aspiration Penetration/Aspiration details (thin cup): Material enters airway, CONTACTS cords and not ejected out;Material enters airway, CONTACTS cords then ejected out Pharyngeal - Solids Pharyngeal - Puree: Delayed swallow initiation;Premature spillage to pyriform sinuses;Reduced laryngeal elevation;Reduced anterior laryngeal mobility;Reduced airway/laryngeal closure;Reduced tongue base retraction  Cervical Esophageal Phase    GO         Functional Assessment Tool Used: clinical judgement Functional Limitations: Swallowing Swallow Current Status ([id number]): At least 40 percent but less than 60 percent impaired, limited or restricted Swallow Goal Status [phone]): At least 40 percent but less than 60 percent impaired, limited or restricted Swallow Discharge Status [phone]): At least 40 percent but less than 60 percent impaired, limited or restricted    Juan Quam Laurice 12/22/2013, 2:15 PM

## 2014-01-02 ENCOUNTER — Telehealth: Payer: Self-pay | Admitting: Family Medicine

## 2014-01-02 ENCOUNTER — Other Ambulatory Visit: Payer: Self-pay

## 2014-01-02 MED ORDER — PAROXETINE HCL 30 MG PO TABS: 30.0000 mg | ORAL_TABLET | Freq: Every day | ORAL | Status: DC

## 2014-01-02 MED ORDER — LORAZEPAM 1 MG PO TABS: ORAL_TABLET | ORAL | Status: DC

## 2014-01-02 NOTE — Telephone Encounter (Signed)
Ok to refill lorazepam with same number with 1 rf

## 2014-01-02 NOTE — Telephone Encounter (Signed)
Refill paroxetine

## 2014-01-02 NOTE — Telephone Encounter (Signed)
Refill lorazepam

## 2014-01-02 NOTE — Telephone Encounter (Signed)
RX faxed

## 2014-01-02 NOTE — Telephone Encounter (Signed)
Please advise refill?  Last RX was done on 12-08-13 quantity 30 with 0 refills

## 2014-01-09 ENCOUNTER — Encounter: Payer: Self-pay | Admitting: Family Medicine

## 2014-01-09 ENCOUNTER — Telehealth: Payer: Self-pay

## 2014-01-09 NOTE — Telephone Encounter (Signed)
We received paperwork from [facility] stating that they have only provided pt with a limited supply of Lorazepam.   Please get a pa started   Phone number is [phone]

## 2014-01-10 NOTE — Telephone Encounter (Signed)
Can you please forward paperwork to me.

## 2014-01-11 NOTE — Telephone Encounter (Signed)
I don't have it? Sorry. I sent it that day to be scanned into his chart

## 2014-01-11 NOTE — Telephone Encounter (Signed)
Awaiting form to be faxed from insurance company

## 2014-01-12 NOTE — Telephone Encounter (Signed)
Please advise labs?  PA is on MD's desk

## 2014-01-12 NOTE — Telephone Encounter (Signed)
PA for received, forward to nurse

## 2014-01-12 NOTE — Telephone Encounter (Signed)
So he just needs a repeat renal panel where ever they want to do it please arrange with patient

## 2014-01-15 NOTE — Addendum Note (Signed)
Addended by: Varney Daily on: 01/15/2014 10:26 AM   Modules accepted: Orders

## 2014-01-19 ENCOUNTER — Encounter
Payer: Managed Care, Other (non HMO) | Attending: Physical Medicine & Rehabilitation | Admitting: Physical Medicine & Rehabilitation

## 2014-01-19 ENCOUNTER — Other Ambulatory Visit (INDEPENDENT_AMBULATORY_CARE_PROVIDER_SITE_OTHER): Payer: Managed Care, Other (non HMO)

## 2014-01-19 ENCOUNTER — Encounter: Payer: Self-pay | Admitting: Physical Medicine & Rehabilitation

## 2014-01-19 VITALS — Resp 14 | Ht 60.0 in | Wt 110.0 lb

## 2014-01-19 DIAGNOSIS — E875 Hyperkalemia: Secondary | ICD-10-CM

## 2014-01-19 DIAGNOSIS — G8250 Quadriplegia, unspecified: Secondary | ICD-10-CM | POA: Insufficient documentation

## 2014-01-19 LAB — RENAL FUNCTION PANEL
Albumin: 4.2 g/dL (ref 3.5–5.2)
BUN: 8 mg/dL (ref 6–23)
CO2: 30 mEq/L (ref 19–32)
Calcium: 9.3 mg/dL (ref 8.4–10.5)
Chloride: 103 mEq/L (ref 96–112)
Creatinine, Ser: 0.5 mg/dL (ref 0.4–1.5)
GFR: 234.58 mL/min (ref >60.00)
Glucose, Bld: 87 mg/dL (ref 70–99)
Phosphorus: 3.4 mg/dL (ref 2.3–4.6)
Potassium: 4.1 mEq/L (ref 3.5–5.1)
Sodium: 140 mEq/L (ref 135–145)

## 2014-01-19 MED ORDER — DANTROLENE SODIUM 25 MG PO CAPS: 25.0000 mg | ORAL_CAPSULE | Freq: Four times a day (QID) | ORAL | Status: DC

## 2014-01-19 NOTE — Progress Notes (Signed)
Subjective:    Patient ID: [name], male    DOB: [birth year], 28 y.o.   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 He has been doing fairly well. He again has had good results with botox for his traps and SCM's.   He still has some pain in his hips. It seems to be more along his inner thighs. The pain wakes him at night and he needs to be repositioned. Some of his sleep pattern has been interrupted by watching too much basketball!!!  He continues on the dantrium 25mg  TID currently.  Pain Inventory Average Pain 6 Pain Right Now 6 My pain is intermittent, tingling and aching  In the last 24 hours, has pain interfered with the following? General activity 0 Relation with others 0 Enjoyment of life 0 What TIME of day is your pain at its worst? night Sleep (in general) Fair  Pain is worse with: na Pain improves with: medication and getting out of bed Relief from Meds: 9  Mobility use a wheelchair needs help with transfers Do you have any goals in this area?  no  Function disabled: date disabled na I need assistance with the following:  feeding, dressing, bathing, toileting, meal prep, household duties and shopping Do you have any goals in this area?  no  Neuro/Psych anxiety  Prior Studies Any changes since last visit?  no  Physicians involved in your care Any changes since last visit?  no   Family History  Problem Relation Age of Onset  . Asthma Mother   . Hyperlipidemia Mother   . COPD Mother   . Other Mother     bronchial stasis/ABPA  . Cancer Maternal Grandmother 41    breast  . Hyperlipidemia Maternal Grandmother   . Hypertension Maternal Grandmother   . Cancer Maternal Grandfather     prostate  . Heart disease Paternal Grandfather     CHF  . Osteoporosis Paternal Grandmother   . Arthritis Paternal Grandmother     rheumatoid   History   Social History  . Marital Status: Single    Spouse Name: N/A    Number of  Children: 0  . Years of Education: N/A   Occupational History  . disbaled    Social History Main Topics  . Smoking status: Never Smoker   . Smokeless tobacco: Never Used  . Alcohol Use: No  . Drug Use: No  . Sexual Activity: No   Other Topics Concern  . None   Social History Narrative  . None   Past Surgical History  Procedure Laterality Date  . Spine surgery  ,11/20/2010, 2011  . Eye surgery    . Ears tubes    . Hamstring released    . Baclofen trial    . Baslofen pump implant    . Spinal fusion    . G-tube insert  August 2006  . Spinal fusioncorrect 106 degree kyphosis    . Spinal fusion to correct 70 degree kyphosis    . Tonsillectomy    . Peg placement  10/21/2011    Procedure: PERCUTANEOUS ENDOSCOPIC GASTROSTOMY (PEG) REPLACEMENT;  Surgeon: [name], MD;  Location: [facility];  Service: Endoscopy;  Laterality: N/A;  . Peg placement N/A 06/13/2013    Procedure: PERCUTANEOUS ENDOSCOPIC GASTROSTOMY (PEG) REPLACEMENT;  Surgeon: [name], MD;  Location: [facility];  Service: Endoscopy;  Laterality: N/A;   Past Medical History  Diagnosis Date  . Cerebral palsy   .  GERD (gastroesophageal reflux disease)   . Anxiety   . Depression   . Thyroid disease     hyper  . Incontinence of feces   . Palpitations   . Esophagitis   . Dehydration 11/22/2013   Resp 14  Ht 5' (1.524 m)  Wt 110 lb (49.896 kg)  BMI 21.48 kg/m2  Opioid Risk Score:   Fall Risk Score:  (pt educated and given brochure previously)   Review of Systems  Psychiatric/Behavioral: The patient is nervous/anxious.   All other systems reviewed and are negative.       Objective:   Physical Exam  General: Alert and oriented x 3, No apparent distress. He is sitting in a motorized chair with head rest and trunk supports. He appears to be in good spirits. HEENT: Head is normocephalic, atraumatic, PERRLA, EOMI, sclera anicteric, oral mucosa pink and moist, dentition intact, ext ear canals  clear,  Neck: Supple without JVD or lymphadenopathy  Heart: Reg rate and rhythm. No murmurs rubs or gallops  Chest: CTA bilaterally without wheezes, rales, or rhonchi; no distress  Abdomen: Soft, non-tender, non-distended, bowel sounds positive.  Extremities: No clubbing, cyanosis, or edema. Pulses are 2+  Skin: Clean and intact without signs of breakdown although there is an area of red/demarcated macular rash around the tube site.  Neuro:He has decreased oralmotor control. He has some movement of his cervical musculature but is limited to his fusion and pain as well. Tone in the UE is 4/4 pec major/minor, 3/4 bicep and wrist/HI--perhaps even a little more than that. He had fluctuating tone in his hamstrings, quads, HAD's at 2-3/4. When supine he was able to lay surprisingly flat. Heel cords were tight as well at 3/4. Pt with minimal volitional movement on exam. He can sense pain in the trunk and extremities  Musculoskeletal: low back, trunk and cervical ROM is limited. He has  kyphosis. His neck tends to devitate to the right and rotates left. Both traps and SCM's are tight, but improved from baseline. Head posture much improved He has tightness in his hip adductors and Hip flexors as well as hamstrings. Psych: Pt's affect is appropriate. Pt is cooperative given his cognitive linguistic issues. Does understand basic commands and answers questions although his speech is difficult to understand.     Assessment & Plan:   1. CP with spastic tetraplegia  2. 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 Severe oro-pharyngeal dysphagia with G-tube.     Plan:  1. Will set up botox for his SCM's, traps, and hip adductors, ?HF for May. 400 U 2. Increase HS dantrium to 50mg  qhs. Continue daytime dosing as is..  3. Lidoderm patches, 5mg , 2 over the lower lumbar spine, nightly before bed.  4. Follow up in May. 15 minutes of  face to face patient care time were spent during this visit. All questions were encouraged and answered.

## 2014-01-19 NOTE — Patient Instructions (Signed)
TAKE AN ADDITIONAL DANTRIUM (TO TOTAL 50MG ) AT NIGHT.   PLEASE CALL ME WITH ANY PROBLEMS OR QUESTIONS (#[phone]).

## 2014-01-20 IMAGING — CR DG CHEST 2V
1 series · 1 of 1 positions shown · non-contrast
Comparison: February 23, 2013.

CLINICAL DATA: Cough

CHEST - 2 VIEW

[view not recorded]
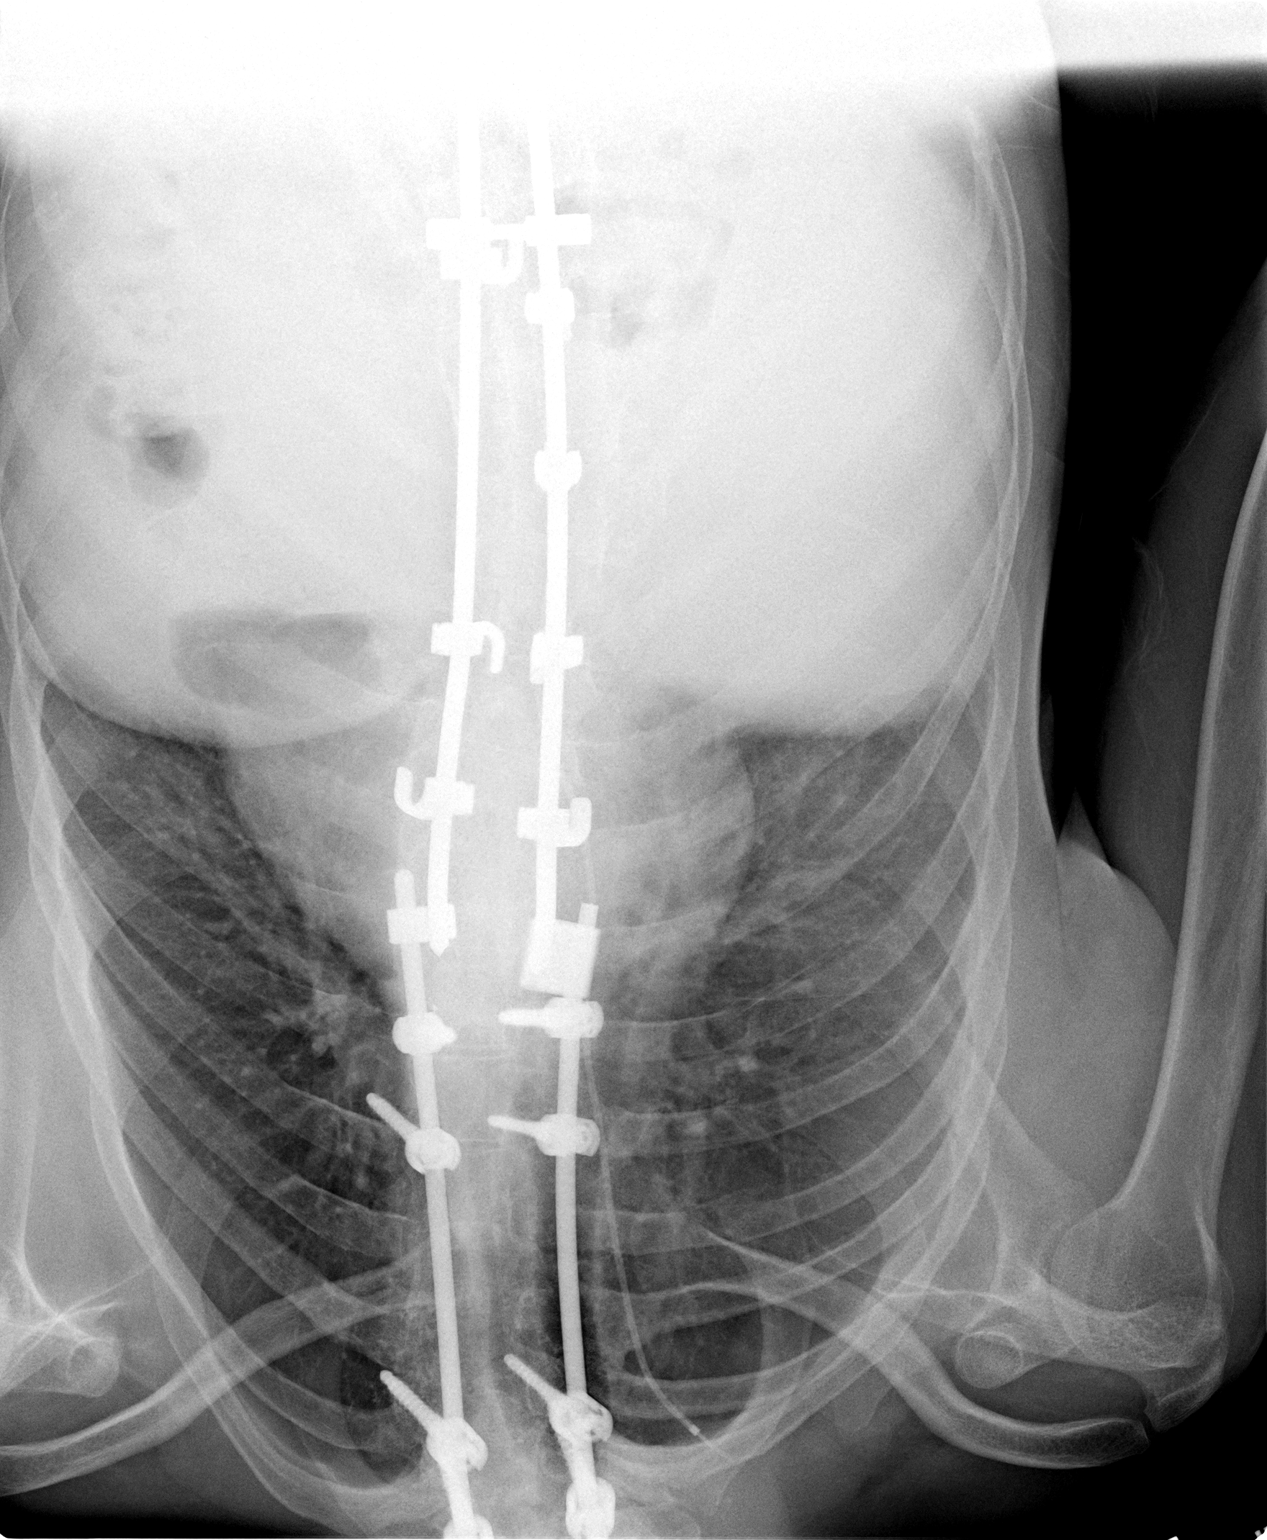

[1 of 1 positions shown; findings below may reference images not displayed]

FINDINGS: Bilateral Harrington rods are seen involving the thoracic
and lumbar spine.  Old ventriculoperitoneal shunt is again noted in
the right side.  Cardiomediastinal silhouette appears normal.  No
acute pulmonary disease is noted.  Bony thorax is intact.  No
pleural effusion or pneumothorax is noted.
IMPRESSION: No acute cardiopulmonary abnormality seen.

## 2014-01-23 ENCOUNTER — Telehealth: Payer: Self-pay

## 2014-01-23 MED ORDER — LORAZEPAM 1 MG PO TABS: ORAL_TABLET | ORAL | Status: DC

## 2014-01-23 NOTE — Telephone Encounter (Signed)
Per md: she believes that [employer] is asking that we change the signature to 1 tab tid.   I informed pts mother per Dr Frederik Pear directions and she stated its ok, and pt doesn't take that many.  New RX sent

## 2014-01-23 NOTE — Telephone Encounter (Signed)
Opened in error

## 2014-01-26 ENCOUNTER — Telehealth: Payer: Self-pay | Admitting: Family Medicine

## 2014-01-26 ENCOUNTER — Telehealth: Payer: Self-pay

## 2014-01-26 NOTE — Telephone Encounter (Signed)
[name] with [employer] part D is calling in regards to patients appeal for the [facility]. Per [name] they can disregard the appeal if the new dosage of 90 pills for a 30 day supply is correct( this is covered under his insurance plan), please contact him back if OK to disregard medication appeal.

## 2014-01-26 NOTE — Telephone Encounter (Signed)
Spoke with patients mother and she says that he does not seem to be oversedated with medications.  She will continue to observe him and will call if she has any issues.

## 2014-01-26 NOTE — Telephone Encounter (Signed)
That's fine as long as he's tolerating it.  Observe for oversedation though

## 2014-01-26 NOTE — Telephone Encounter (Signed)
FYI:: [name] called to inform you that she has been giving patient diazepam 5 mg and melatonin 6 mg in addition to dantrium 50 mg for insomnia. Is that okay?

## 2014-01-26 NOTE — Telephone Encounter (Signed)
[name] with [employer] was informed that a new RX was sent in this week for 90 tablets for 30 days

## 2014-01-29 ENCOUNTER — Other Ambulatory Visit: Payer: Self-pay | Admitting: Internal Medicine

## 2014-01-29 NOTE — Telephone Encounter (Signed)
Message copied by Larina Bras on Mon Jan 29, 2014  1:35 PM ------      Message from: [name]      Created: Mon Jan 29, 2014  1:27 PM       Yes, OK to refill Carafate via ? PEG, bid,  10cc at a time,14 oz, 1 refill      ----- Message -----         From: Larina Bras, CMA         Sent: 01/29/2014   1:23 PM           To: [name], MD            I have an rx refill request for sucralfate on patient. It looks like it has been a while since he has had this. Can I go ahead and refill?       ------

## 2014-01-29 NOTE — Telephone Encounter (Signed)
Opened in error

## 2014-01-29 NOTE — Telephone Encounter (Signed)
Rx sent 

## 2014-01-30 ENCOUNTER — Telehealth: Payer: Self-pay

## 2014-01-30 NOTE — Telephone Encounter (Signed)
I filled out the form this week for 90, are they still saying no or is she asking for more than that?

## 2014-01-30 NOTE — Telephone Encounter (Signed)
FYI:  pts mother left a message stating that she looked at her [employer] book and pt is allowed 90 lorazepam in a 30 day period. Pt would like [place] to do know more with this because she believes that the issue is because the first RX was picked up and then the one for 90  Was picked up 20 days later. Pts mother states pt doesn't take 3 a day and this RX is going to last awhile.

## 2014-01-30 NOTE — Telephone Encounter (Signed)
No she said she doesn't wast [place] to fill anymore of the forms out. She already got the 2 RX's

## 2014-02-01 ENCOUNTER — Other Ambulatory Visit: Payer: Self-pay | Admitting: Family Medicine

## 2014-02-07 IMAGING — CR DG CHEST 2V
3 series · 3 of 3 positions shown · non-contrast
Comparison: 06/16/2013

CLINICAL DATA: Cough

CHEST - 2 VIEW

[x chest ap (1 of 2)]
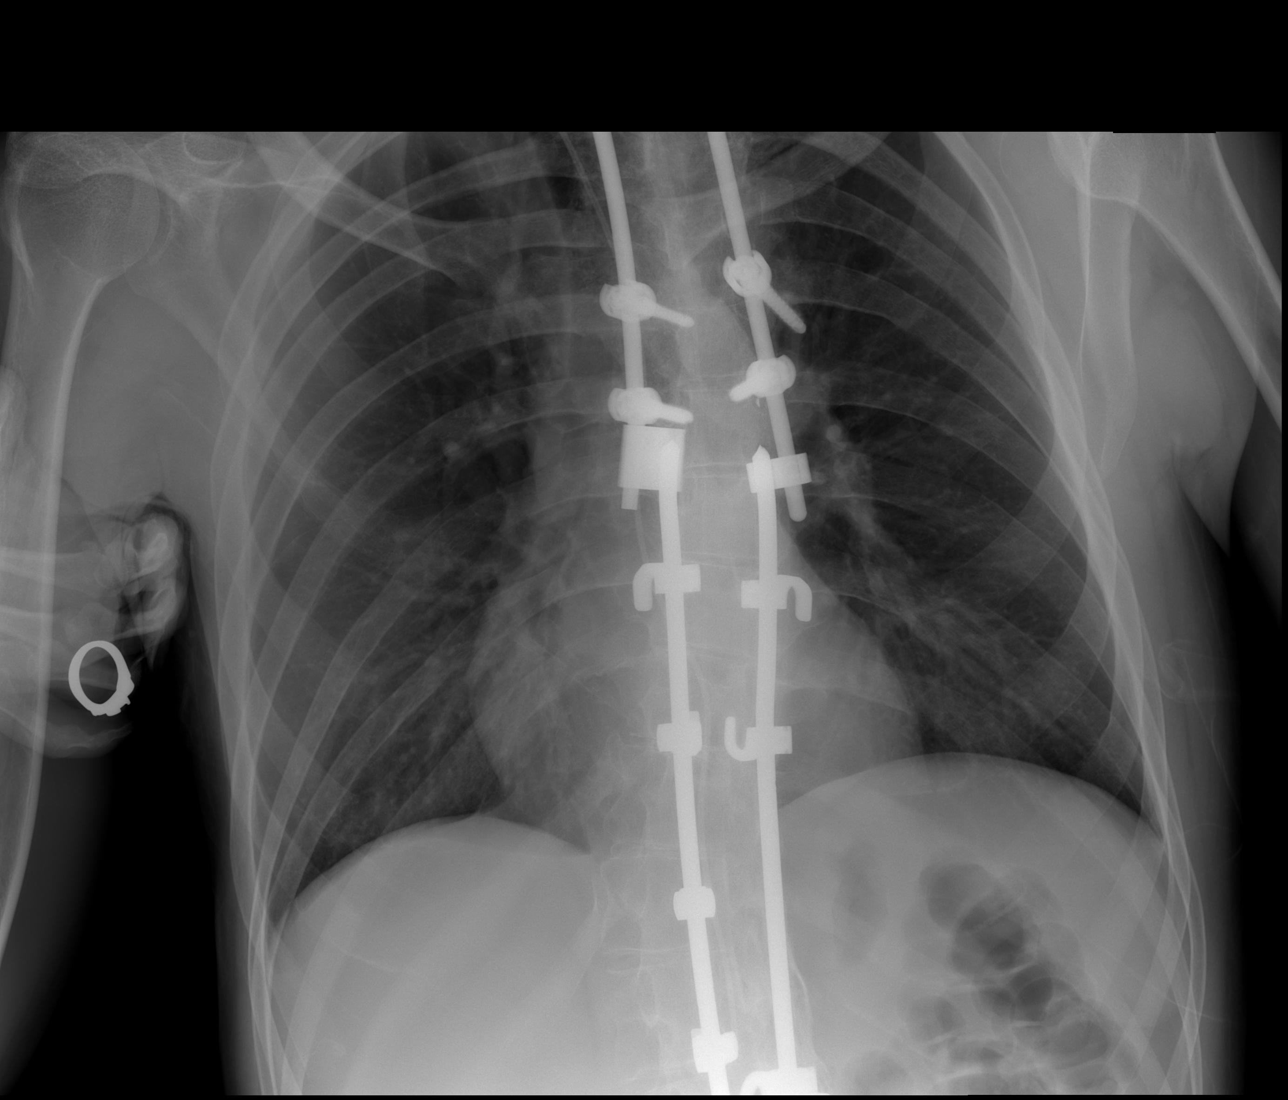

[x chest ap (2 of 2)]
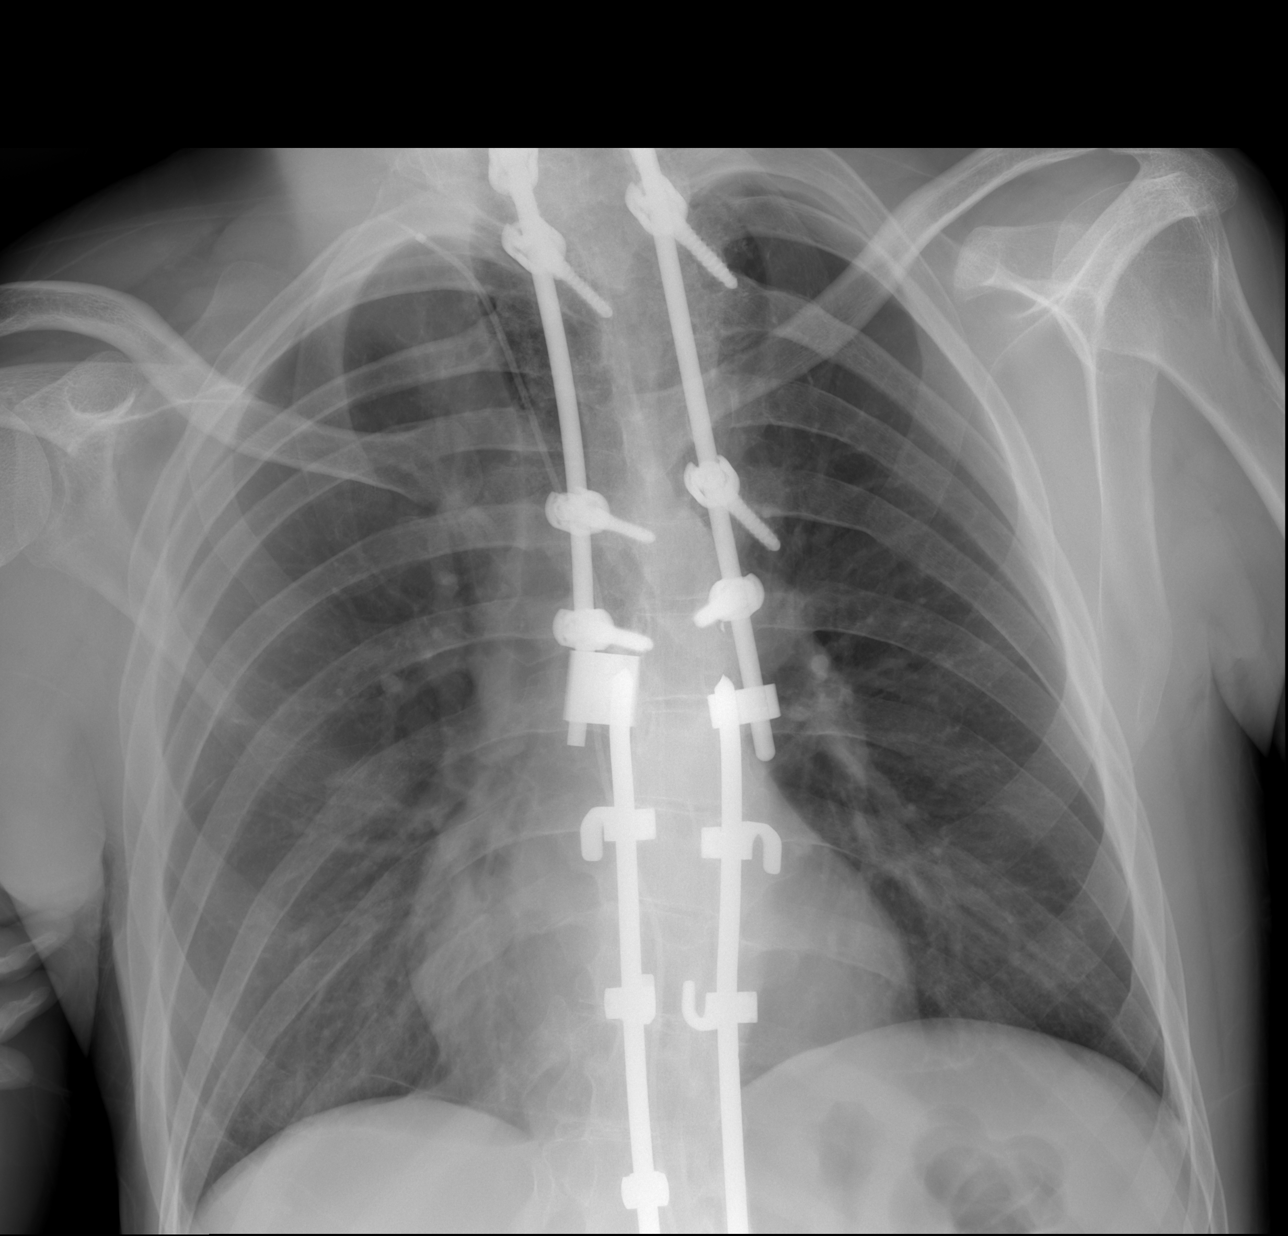

[w chest lat]
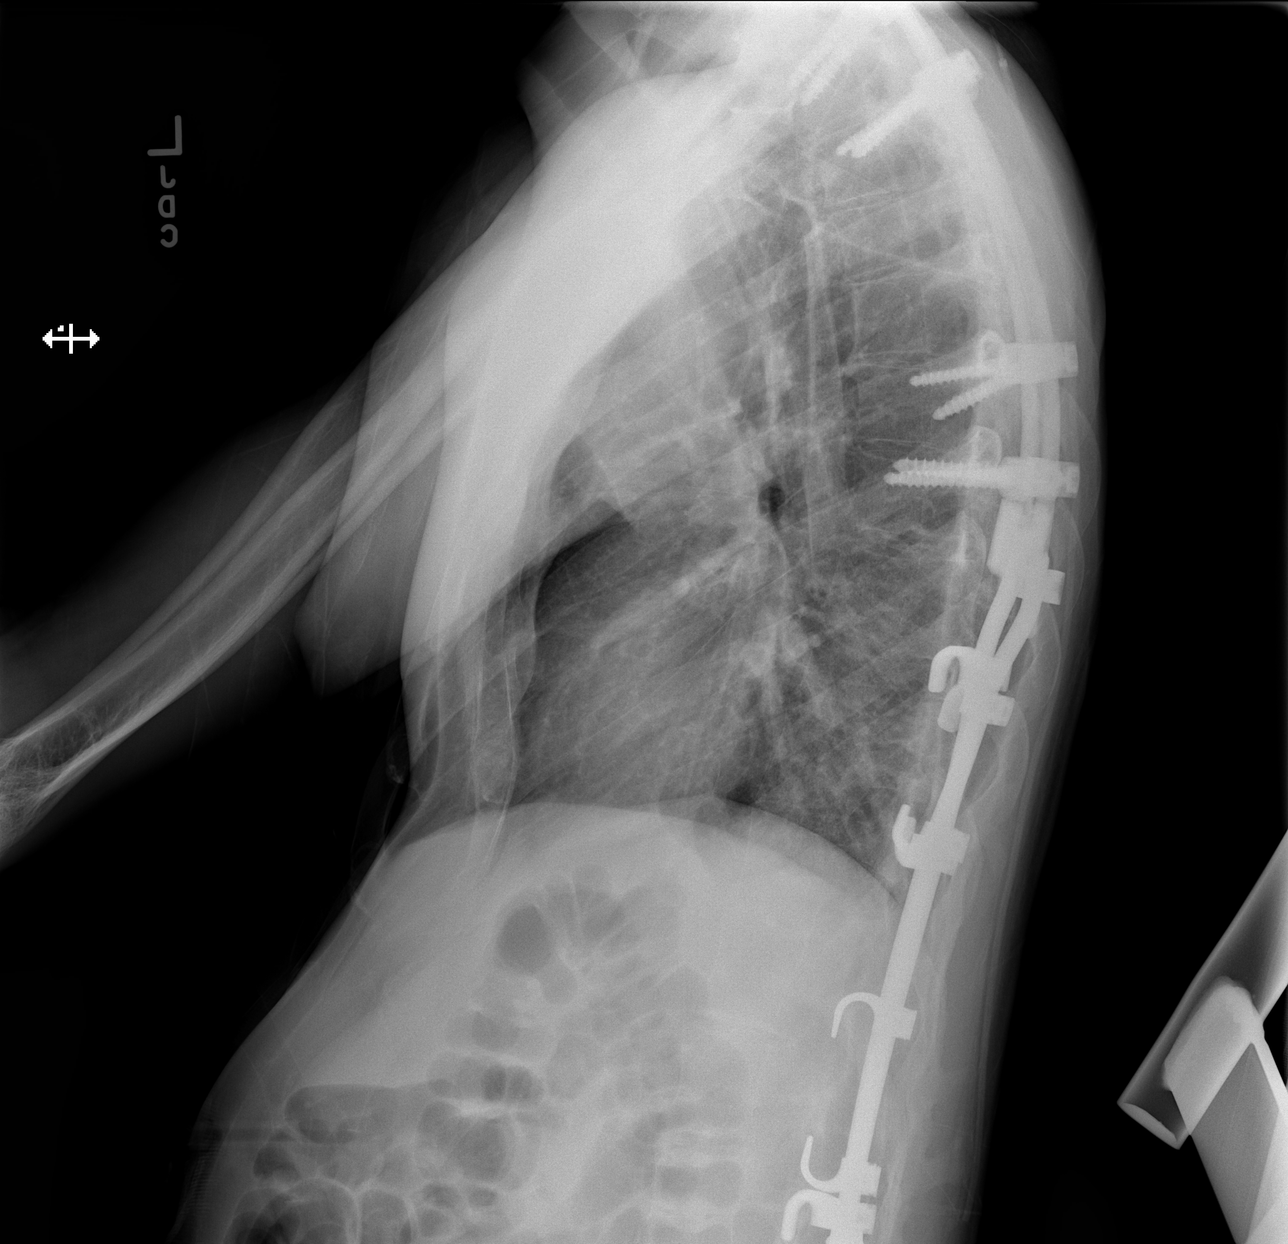

[3 of 3 positions shown; findings below may reference images not displayed]

FINDINGS: Cardiac shadow is stable.  Harrington rods are again
identified and stable.  A catheter is noted over the midline
similar to that seen on the prior exam.  No focal infiltrate is
seen.
IMPRESSION: There are no changes from prior exam.

## 2014-02-20 ENCOUNTER — Encounter
Payer: Managed Care, Other (non HMO) | Attending: Physical Medicine & Rehabilitation | Admitting: Physical Medicine & Rehabilitation

## 2014-02-20 ENCOUNTER — Encounter: Payer: Self-pay | Admitting: Physical Medicine & Rehabilitation

## 2014-02-20 VITALS — BP 94/79 | HR 75 | Resp 14

## 2014-02-20 DIAGNOSIS — G8250 Quadriplegia, unspecified: Secondary | ICD-10-CM

## 2014-02-20 NOTE — Progress Notes (Signed)
Botox Injection for spasticity using needle EMG guidance Indication: spastic tetraplegia  Dilution: 100 Units/ml        Total Units Injected: 400  Bilateral hip adductors and trap/scm   Indication: Severe spasticity which interferes with ADL,mobility and/or  hygiene and is unresponsive to medication management and other conservative care Informed consent was obtained after describing risks and benefits of the procedure with the patient. This includes bleeding, bruising, infection, excessive weakness, or medication side effects. A REMS form is on file and signed.  Needle: 70mm injectable monopolar needle electrode  Number of units per muscle Right Trap 50 units in 3 access points Left Trap 75 units in 3 access points Right SCM 25 units in 2 accesspoints Left SCM 50 units in 3 access points  Hip Adductor Right 100 units, 3 access points Hip Adductor Left 100 units 3 access points     All injections were done after obtaining appropriate EMG activity and after negative drawback for blood. The patient tolerated the procedure well. Post procedure instructions were given. A followup appointment was made.    Discussed adjusting dantrium up to 75mg  at night ultimately. Will also try to wean valium off again. Currently he's on 7.5mg .

## 2014-02-20 NOTE — Patient Instructions (Signed)
PLEASE CALL ME WITH ANY PROBLEMS OR QUESTIONS (#[phone]).      

## 2014-02-23 ENCOUNTER — Encounter: Payer: Self-pay | Admitting: Adult Health

## 2014-02-23 ENCOUNTER — Ambulatory Visit (INDEPENDENT_AMBULATORY_CARE_PROVIDER_SITE_OTHER): Payer: Managed Care, Other (non HMO) | Admitting: Adult Health

## 2014-02-23 VITALS — BP 108/78 | HR 73 | Temp 97.8°F | Ht 62.0 in

## 2014-02-23 DIAGNOSIS — J209 Acute bronchitis, unspecified: Secondary | ICD-10-CM

## 2014-02-23 MED ORDER — AZITHROMYCIN 250 MG PO TABS: ORAL_TABLET | ORAL | Status: AC

## 2014-02-23 NOTE — Patient Instructions (Addendum)
Zpack take as directed. Per tube .  Mucinex liquid per tube As needed   Please contact office for sooner follow up if symptoms do not improve or worsen or seek emergency care  Follow up Dr Elsworth Soho  3 month and As needed

## 2014-02-27 DIAGNOSIS — J189 Pneumonia, unspecified organism: Secondary | ICD-10-CM | POA: Insufficient documentation

## 2014-02-27 DIAGNOSIS — J209 Acute bronchitis, unspecified: Secondary | ICD-10-CM | POA: Insufficient documentation

## 2014-02-27 NOTE — Assessment & Plan Note (Signed)
Acute Bronchitis in setting of CP /poor mucus clearance   Plan  Zpack take as directed. Per tube .  Mucinex liquid per tube As needed   Please contact office for sooner follow up if symptoms do not improve or worsen or seek emergency care  Follow up Dr. Chase Caller 3 month and As needed

## 2014-02-27 NOTE — Progress Notes (Signed)
Subjective:    Patient ID: [name], male    DOB: 1986-02-19, 28 y.o.   MRN: [id number]  HPI  28year old male.- accompanied by mother . He has cerebral palsy and needs assistance for all activities of daily living, baseline wheelchair-bound. Underwent 8h T9-C6 spinal fusion at [facility] in Sept 2011. He had a PEG placed in 2012 for hydration. He was hospitalized in 08/2013 for aspiration pneumonia,Swallow evaluation showed severe dysphagia with high risk of aspirating. He has 4 caregivers at home. Apparently there have been some issues with getting him a feeding pump at home, and he is been maintained on a dysphagia 3 diet.    02/23/14 Acute OV  Accompanied by mother  Complains increase in fatigue. Pts mother stated he is afebrile.  She states he has had a wet cough for 4 days  but unable to produce mucous with coughing. C/o CP when coughing. Cant get mucus to come up , rattles because he cant cough up. No fever, edema , vomiting.  No recent abx.  Remains on D3 diet w/ some bolus feeds via peg.    Past Medical History  Diagnosis Date  . Cerebral palsy   . GERD (gastroesophageal reflux disease)   . Anxiety   . Depression   . Thyroid disease     hyper  . Incontinence of feces   . Palpitations   . Esophagitis   . Dehydration 11/22/2013    Past Surgical History  Procedure Laterality Date  . Spine surgery  ,11/20/2010, 2011  . Eye surgery    . Ears tubes    . Hamstring released    . Baclofen trial    . Baslofen pump implant    . Spinal fusion    . G-tube insert  August 2006  . Spinal fusioncorrect 106 degree kyphosis    . Spinal fusion to correct 70 degree kyphosis    . Tonsillectomy    . Peg placement  10/21/2011    Procedure: PERCUTANEOUS ENDOSCOPIC GASTROSTOMY (PEG) REPLACEMENT;  Surgeon: [name], MD;  Location: [facility];  Service: Endoscopy;  Laterality: N/A;  . Peg placement N/A 06/13/2013    Procedure: PERCUTANEOUS ENDOSCOPIC GASTROSTOMY  (PEG) REPLACEMENT;  Surgeon: [name], MD;  Location: [facility];  Service: Endoscopy;  Laterality: N/A;    Allergies  Allergen Reactions  . Ambien [Zolpidem Tartrate] Nausea Only  . Codeine   . Sulfonamide Derivatives Rash    History   Social History  . Marital Status: Single    Spouse Name: N/A    Number of Children: 0  . Years of Education: N/A   Occupational History  . disbaled    Social History Main Topics  . Smoking status: Never Smoker   . Smokeless tobacco: Never Used  . Alcohol Use: No  . Drug Use: No  . Sexual Activity: No   Other Topics Concern  . Not on file   Social History Narrative  . No narrative on file    Family History  Problem Relation Age of Onset  . Asthma Mother   . Hyperlipidemia Mother   . COPD Mother   . Other Mother     bronchial stasis/ABPA  . Cancer Maternal Grandmother 55    breast  . Hyperlipidemia Maternal Grandmother   . Hypertension Maternal Grandmother   . Cancer Maternal Grandfather     prostate  . Heart disease Paternal Grandfather     CHF  . Osteoporosis Paternal Grandmother   .  Arthritis Paternal Grandmother     rheumatoid         Review of Systems Constitutional:   No  weight loss, night sweats,  Fevers, chills, fatigue, or  lassitude.  HEENT:   No headaches,  Difficulty swallowing,  Tooth/dental problems, or  Sore throat,                No sneezing, itching, ear ache, + nasal congestion, post nasal drip,   CV:  No chest pain,  Orthopnea, PND, swelling in lower extremities, anasarca, dizziness, palpitations, syncope.   GI  No heartburn, indigestion, abdominal pain, nausea, vomiting, diarrhea, change in bowel habits, loss of appetite, bloody stools.   Resp:  .  No chest wall deformity  Skin: no rash or lesions.  GU: no dysuria, change in color of urine, no urgency or frequency.  No flank pain, no hematuria   MS:   .  No back pain.  Psych:   No memory loss.      Objective:   Physical  Exam   Gen. Pleasant, in power wheelchair ENT - no lesions, no post nasal drip, class 2-3 airway Neck: No JVD, no thyromegaly, no carotid bruits Lungs: no use of accessory muscles, no dullness to percussion, few rhonchi  Cardiovascular: Rhythm regular, heart sounds  normal, no murmurs or gallops, no peripheral edema Abdomen: soft and non-tender, no hepatosplenomegaly, BS normal. Musculoskeletal: CP contractures , no cyanosis or clubbing Neuro:  Alert, no tremors        Assessment & Plan:

## 2014-03-02 IMAGING — XA IR REPLACE G-TUBE/COLONIC TUBE
2 series · 5 of 5 positions shown · non-contrast
Comparison: Fluoroscopic-guided gastrostomy tube exchange -
02/21/2013

INDICATION: Poorly functioning gastrostomy tube, subsequent
encounter.

FLUROSCOPIC GUIDED REPLACEMENT OF GASTROSTOMY TUBE

[Series 1: fl neuro · 2 of 2 slices shown]
[im 1/2]
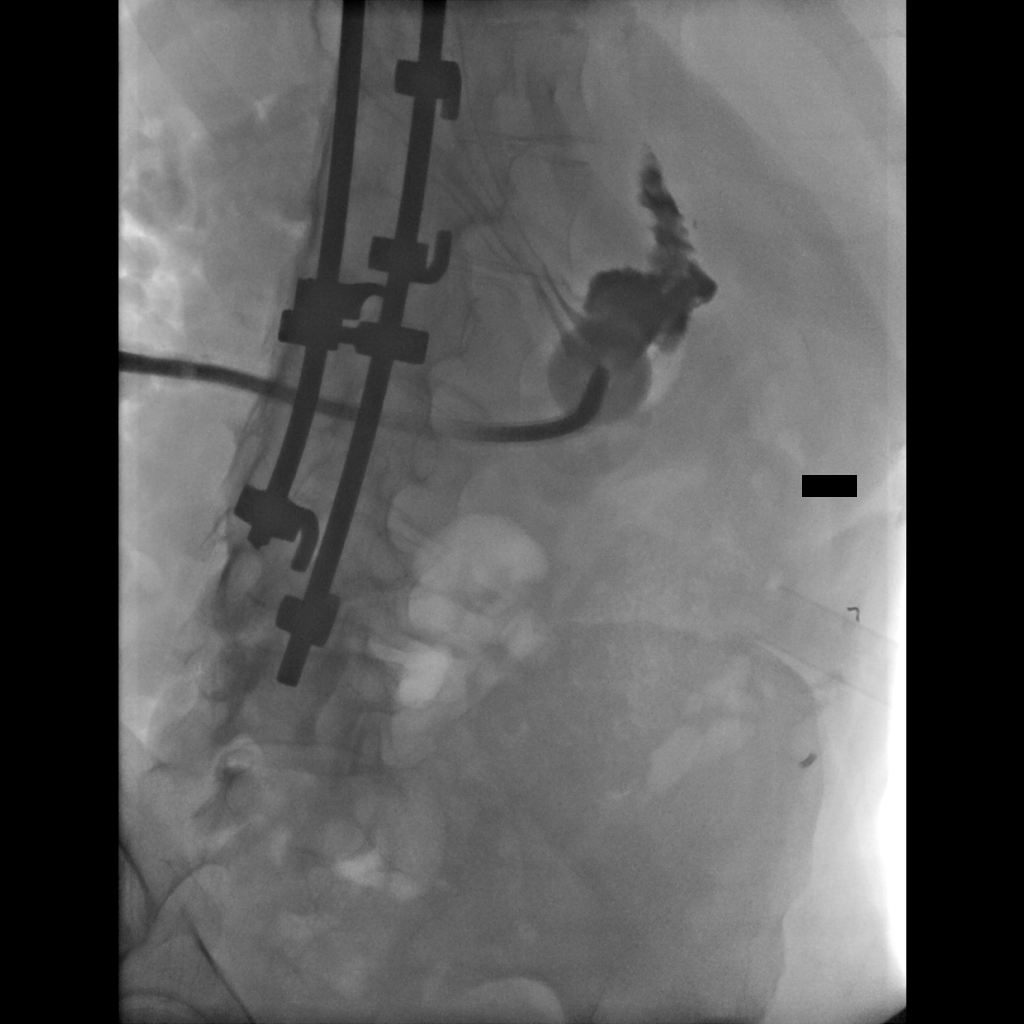
[im 2/2]
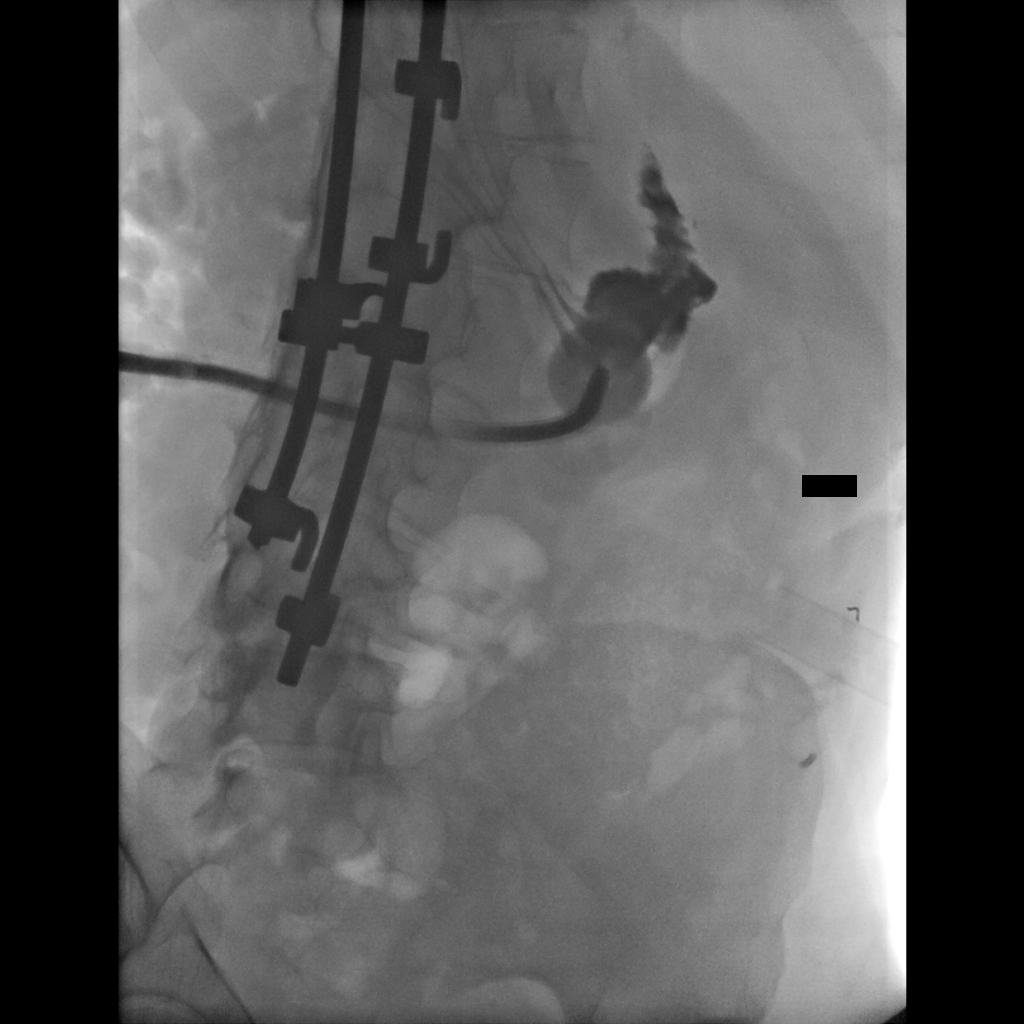

[Series 300: sp replc gastro/colonic tube percut w/fl · 3 of 3 slices shown]
[im 1/3]
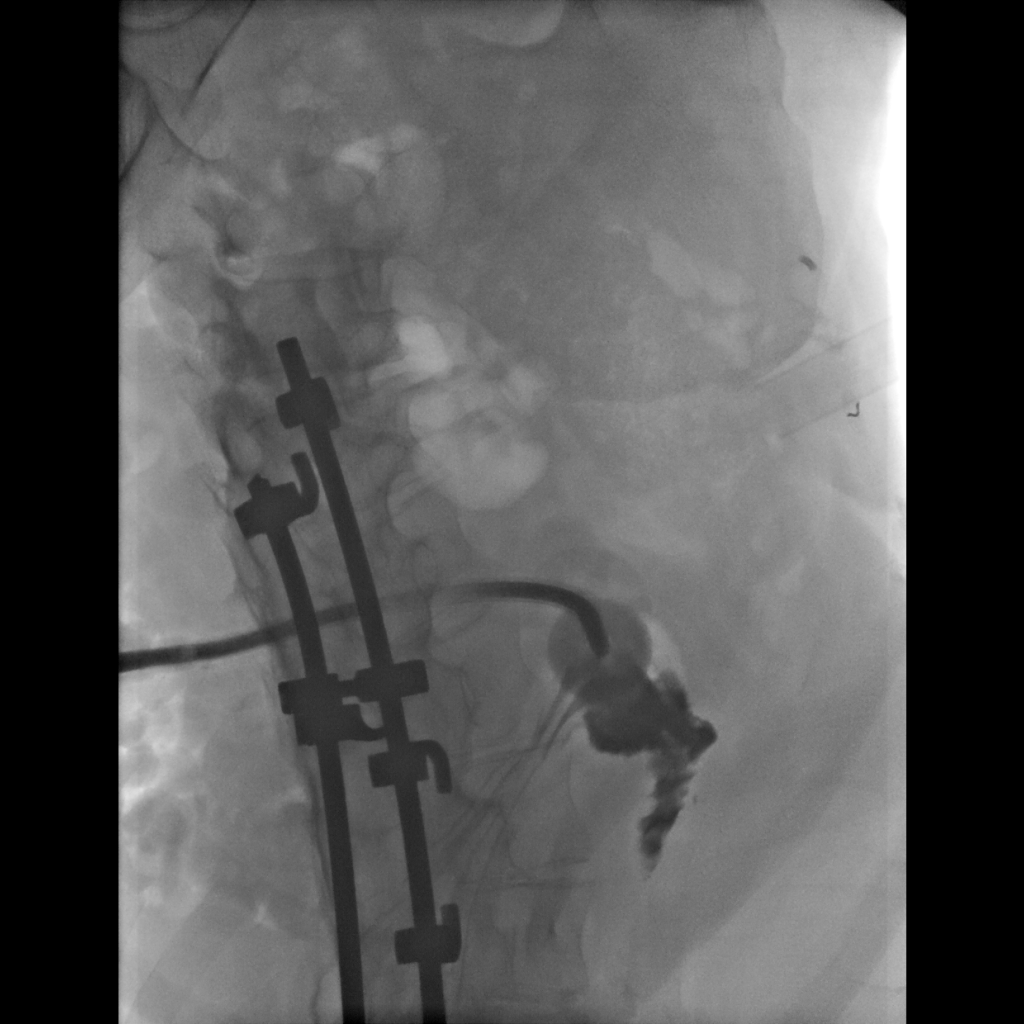
[im 2/3]
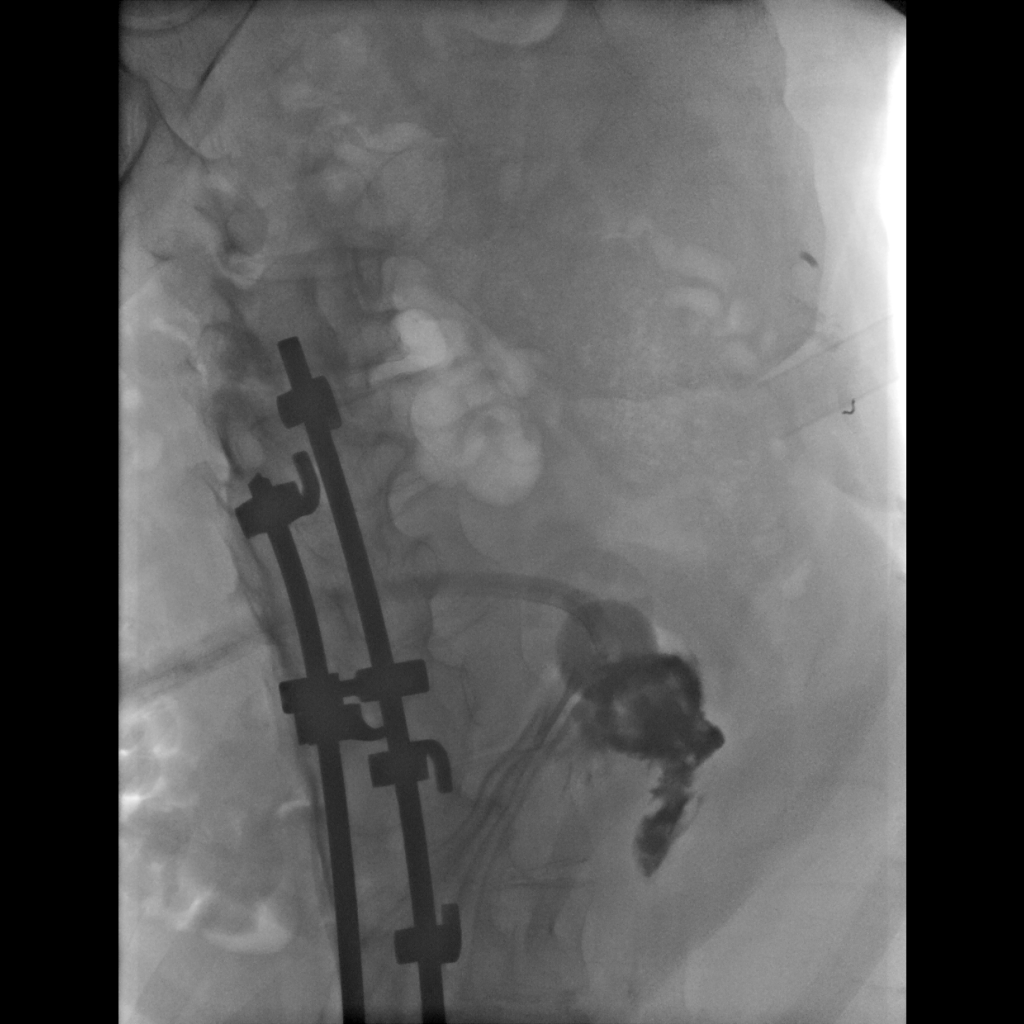
[im 3/3]
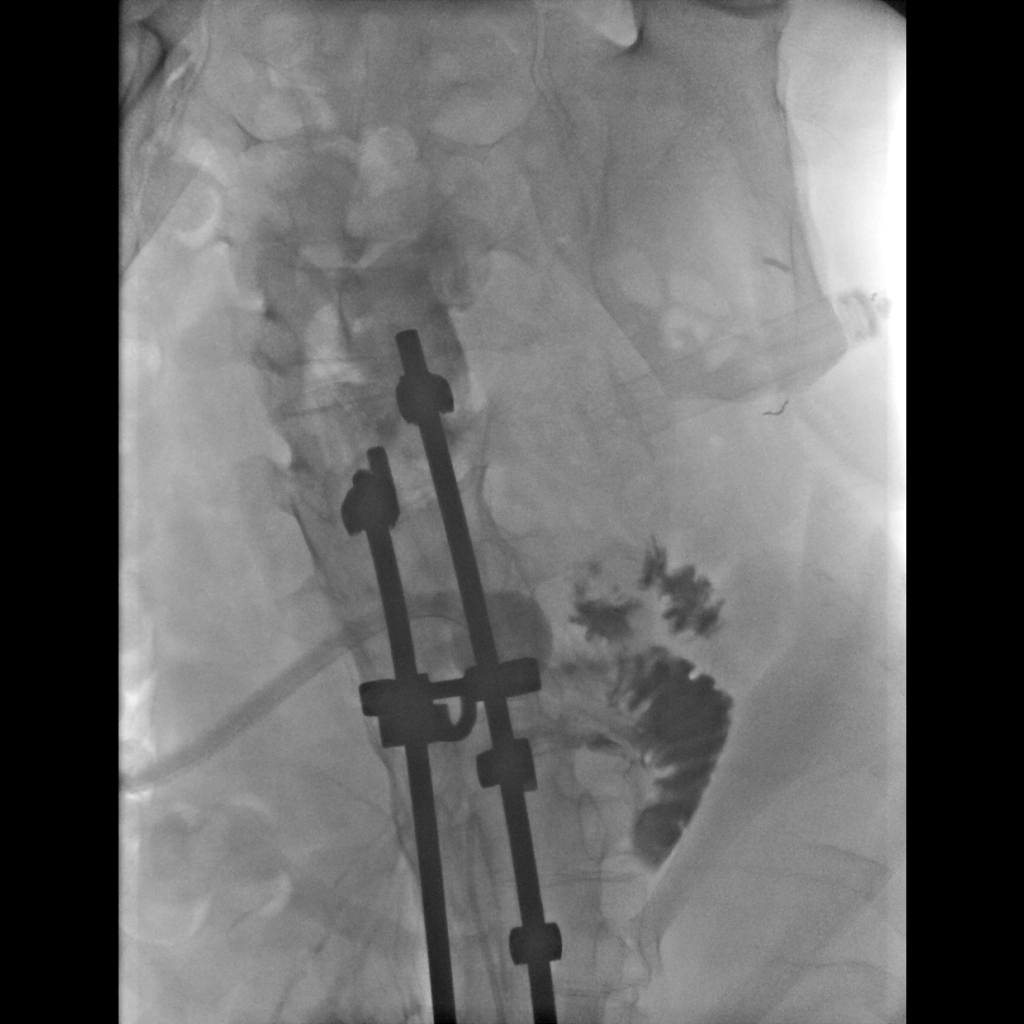

[5 of 5 positions shown; findings below may reference images not displayed]

Intravenous Medications: None

Contrast: 10 ml Nmnipaque-XHH, administered enterically

Fluoroscopy Time: 12 seconds.

Complications: None immediate

Technique / Findings:

The upper abdomen and external portion of the existing gastrostomy
tube was prepped and draped in the usual sterile fashion, and a
sterile drape was applied covering the operative field.  Maximum
barrier sterile technique with sterile gowns and gloves were used
for the procedure.  A timeout was performed prior to the initiation
of the procedure.

The existing gastrostomy tube was injected with a small amount of
contrast confirming appropriate positioning within the gastric
lumen.  The existing gastrostomy tube was removed and a new 24-
Gilera Runner Draga balloon inflatable gastrostomy tube.  The balloon was
inflated with approximately 12 ml of a mixture of saline and dilute
contrast and the disc was cinched.  Contrast was injected and
several spot fluoroscopic images were obtained in various
obliquities to confirm appropriate intraluminal positioning.  A
dressing was placed.  The patient tolerated the procedure well
without immediate postprocedural complication.
IMPRESSION: Successful fluoroscopic guided replacement of a new 24-French
gastrostomy tube.  The new gastrostomy tube is ready for immediate
use.

## 2014-03-10 IMAGING — CR DG CHEST 2V
1 series · 1 of 1 positions shown · non-contrast
Comparison: Chest radiograph July 04, 2013

CLINICAL DATA: Possible aspiration.

CHEST - 2 VIEW

[w chest lat]
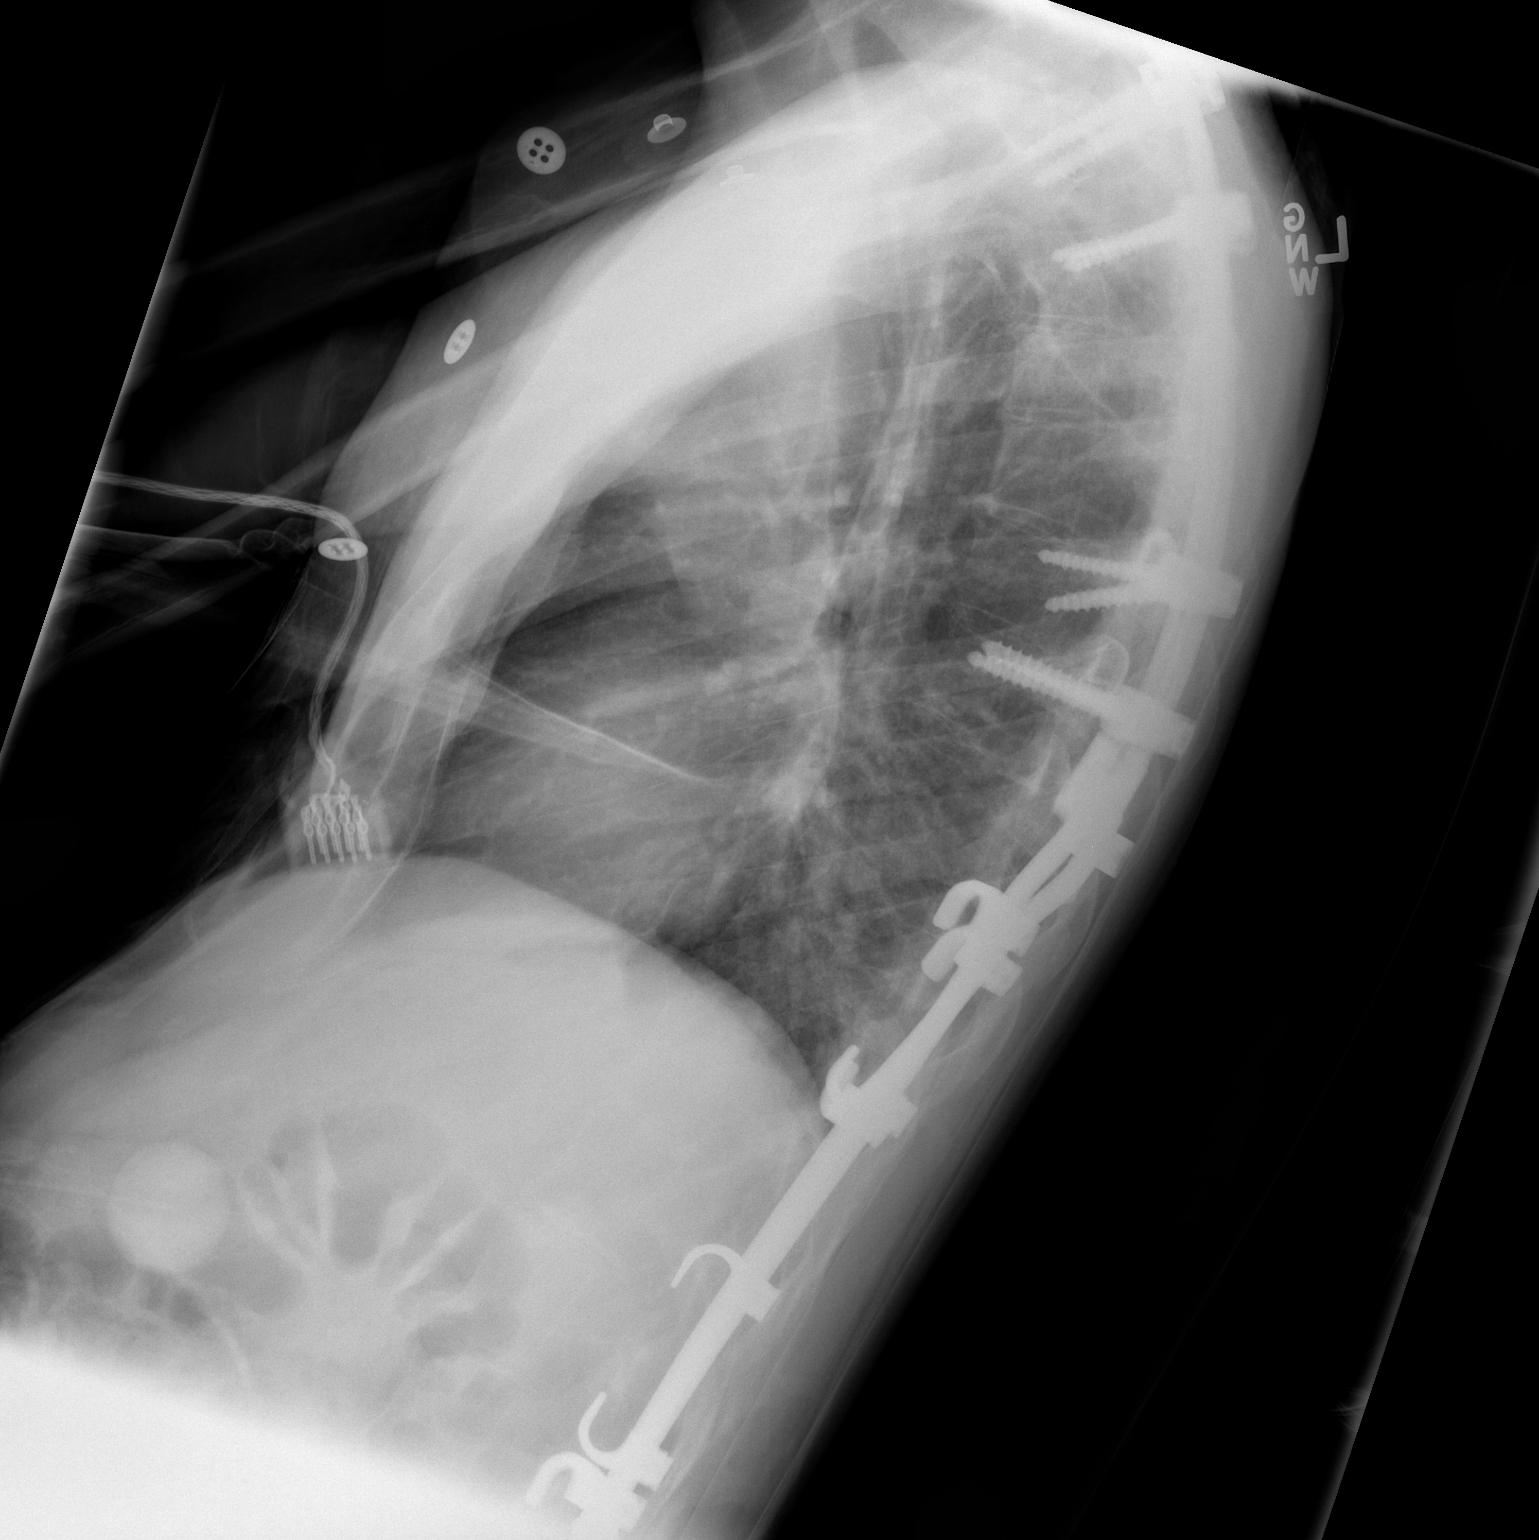

[1 of 1 positions shown; findings below may reference images not displayed]

FINDINGS: Cardiomediastinal silhouette is unremarkable and
unchanged.  No pleural effusions or focal consolidations.
Pulmonary vasculature is unremarkable.  No pneumothorax.
Harrington rods partially imaged.  Gastrostomy tube in included
view of the abdomen. Tube projecting in the right lung apex may
reflect a central venous catheter and is unchanged in position from
prior examination.
IMPRESSION: No acute cardiopulmonary process.  Stable appearance of the chest
from July 04, 2013.

## 2014-03-11 IMAGING — CR DG ABDOMEN 1V
1 series · 1 of 1 positions shown · non-contrast
Comparison: None.

CLINICAL DATA: Evaluate PEG tube placement

EXAM:
ABDOMEN - 1 VIEW

[x abdomen supine]
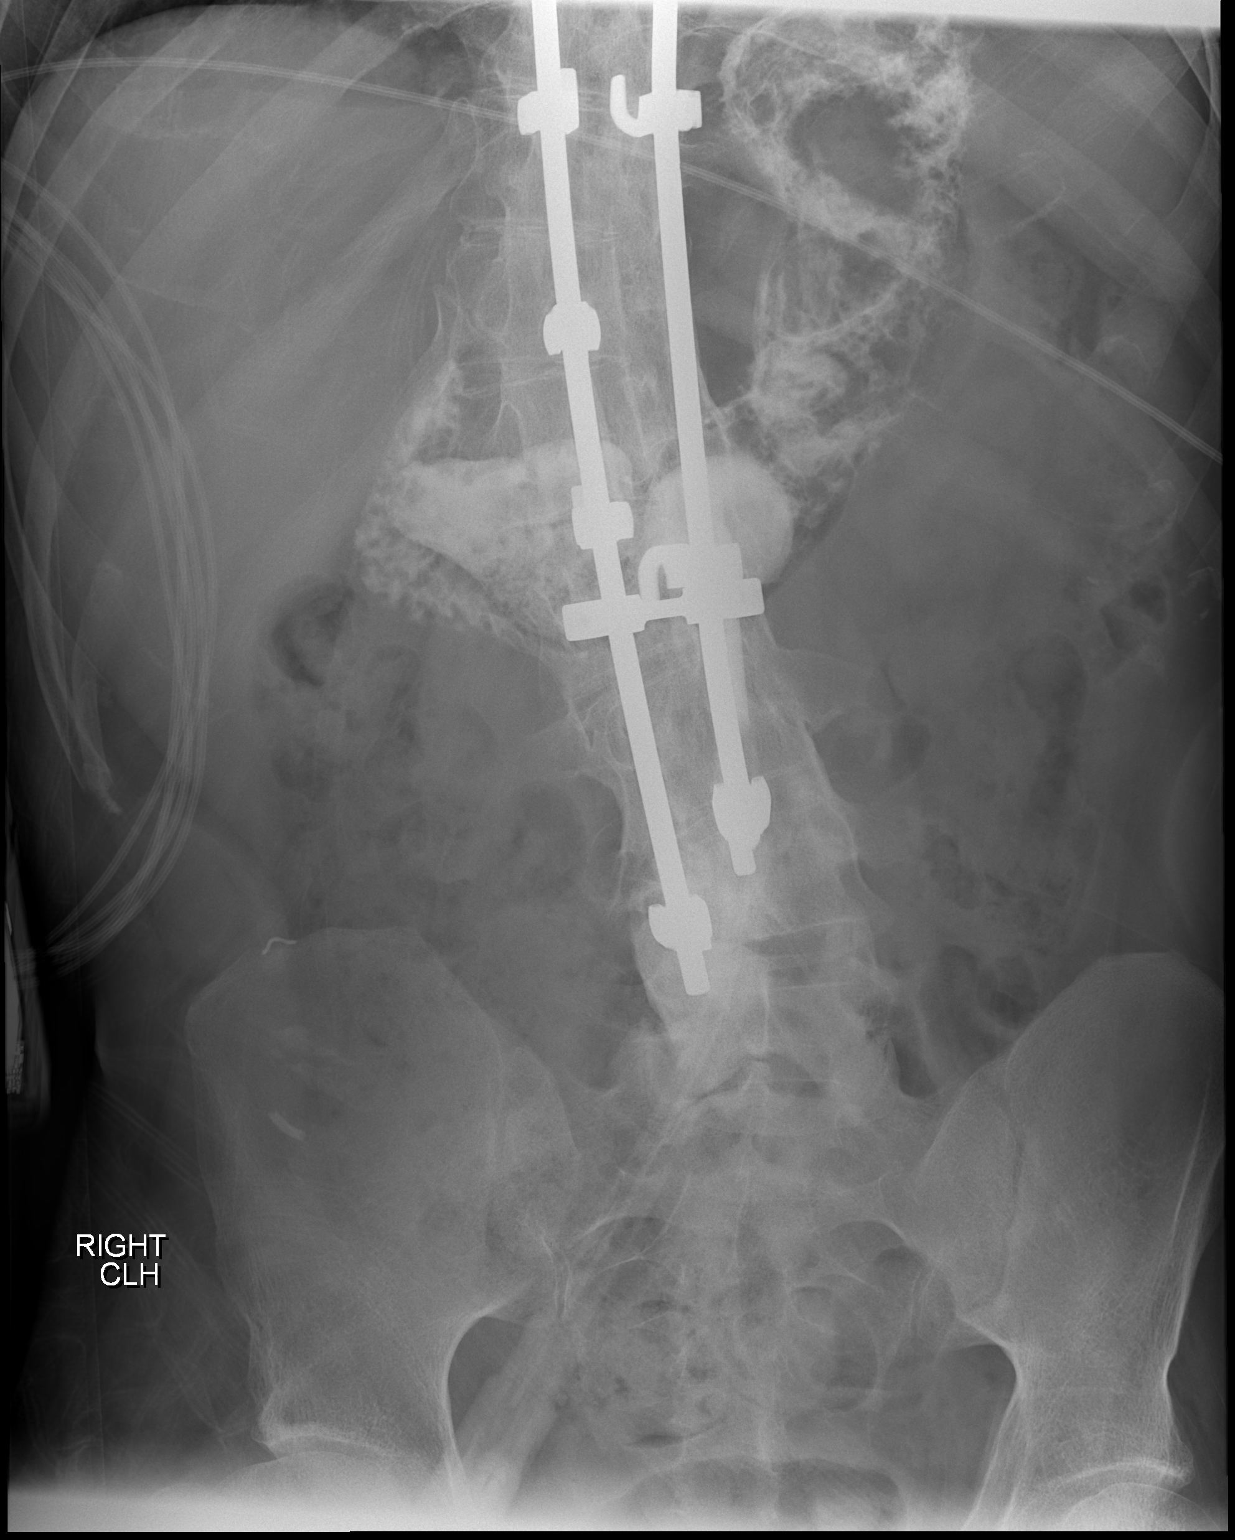

[1 of 1 positions shown; findings below may reference images not displayed]

FINDINGS: Gastrostomy tube is identified within the central abdomen. Barium
injected through the gastrostomy tube opacifies the stomach and
duodenal C loop. The bowel gas pattern appears nonobstructive. Gas
and stool is noted throughout the colon up to the rectum. Scoliosis
rods are identified within the thoracic and lumbar spine.
IMPRESSION: Gastrostomy tube is within the gastric lumen.

## 2014-03-13 DIAGNOSIS — N1330 Unspecified hydronephrosis: Secondary | ICD-10-CM | POA: Diagnosis not present

## 2014-03-13 DIAGNOSIS — N3941 Urge incontinence: Secondary | ICD-10-CM | POA: Diagnosis not present

## 2014-03-21 ENCOUNTER — Ambulatory Visit: Payer: Managed Care, Other (non HMO) | Admitting: Physical Medicine & Rehabilitation

## 2014-03-27 ENCOUNTER — Encounter: Payer: Self-pay | Admitting: Family Medicine

## 2014-03-27 ENCOUNTER — Ambulatory Visit (INDEPENDENT_AMBULATORY_CARE_PROVIDER_SITE_OTHER): Payer: Medicare Other | Admitting: Family Medicine

## 2014-03-27 VITALS — BP 118/70 | HR 94 | Temp 98.2°F | Ht 62.0 in

## 2014-03-27 DIAGNOSIS — L309 Dermatitis, unspecified: Secondary | ICD-10-CM

## 2014-03-27 DIAGNOSIS — G809 Cerebral palsy, unspecified: Secondary | ICD-10-CM

## 2014-03-27 DIAGNOSIS — L259 Unspecified contact dermatitis, unspecified cause: Secondary | ICD-10-CM | POA: Diagnosis not present

## 2014-03-27 DIAGNOSIS — J209 Acute bronchitis, unspecified: Secondary | ICD-10-CM

## 2014-03-27 DIAGNOSIS — F411 Generalized anxiety disorder: Secondary | ICD-10-CM | POA: Diagnosis not present

## 2014-03-27 DIAGNOSIS — R638 Other symptoms and signs concerning food and fluid intake: Secondary | ICD-10-CM

## 2014-03-27 DIAGNOSIS — G808 Other cerebral palsy: Secondary | ICD-10-CM

## 2014-03-27 DIAGNOSIS — R32 Unspecified urinary incontinence: Secondary | ICD-10-CM

## 2014-03-27 MED ORDER — CLOTRIMAZOLE-BETAMETHASONE 1-0.05 % EX CREA: 1.0000 "application " | TOPICAL_CREAM | Freq: Two times a day (BID) | CUTANEOUS | Status: DC

## 2014-03-27 MED ORDER — DIAZEPAM 5 MG PO TABS: 2.5000 mg | ORAL_TABLET | Freq: Every day | ORAL | Status: DC

## 2014-03-27 NOTE — Progress Notes (Signed)
Pre visit review using our clinic review tool, if applicable. No additional management support is needed unless otherwise documented below in the visit note. 

## 2014-04-01 ENCOUNTER — Encounter: Payer: Self-pay | Admitting: Family Medicine

## 2014-04-01 NOTE — Progress Notes (Signed)
Patient ID: [name], male   DOB: Jul 11, 1986, 28 y.o.   MRN: [id number] [name] [id number] 13-Apr-1986 04/01/2014      Progress Note-Follow Up  Subjective  Chief Complaint  Chief Complaint  Patient presents with  . Follow-up    from specialists    HPI  Patient is a 28 year old male in today for routine medical care. Brought in today by his mother for follow up and to have forms completed. He is doing much better. He is taking by mouth intake well. His anxiety appears improved. His contractures have been improved some with Botox injections. No recent new concerns. Has been seen by urology and started on Ditropan. He is tolerating. Denies CP/palp/SOB/HA/congestion/fevers/GI or GU c/o. Taking meds as prescribed  Past Medical History  Diagnosis Date  . Cerebral palsy   . GERD (gastroesophageal reflux disease)   . Anxiety   . Depression   . Thyroid disease     hyper  . Incontinence of feces   . Palpitations   . Esophagitis   . Dehydration 11/22/2013    Past Surgical History  Procedure Laterality Date  . Spine surgery  ,11/20/2010, 2011  . Eye surgery    . Ears tubes    . Hamstring released    . Baclofen trial    . Baslofen pump implant    . Spinal fusion    . G-tube insert  August 2006  . Spinal fusioncorrect 106 degree kyphosis    . Spinal fusion to correct 70 degree kyphosis    . Tonsillectomy    . Peg placement  10/21/2011    Procedure: PERCUTANEOUS ENDOSCOPIC GASTROSTOMY (PEG) REPLACEMENT;  Surgeon: [name], MD;  Location: [facility];  Service: Endoscopy;  Laterality: N/A;  . Peg placement N/A 06/13/2013    Procedure: PERCUTANEOUS ENDOSCOPIC GASTROSTOMY (PEG) REPLACEMENT;  Surgeon: [name], MD;  Location: [facility];  Service: Endoscopy;  Laterality: N/A;    Family History  Problem Relation Age of Onset  . Asthma Mother   . Hyperlipidemia Mother   . COPD Mother   . Other Mother     bronchial stasis/ABPA  . Cancer Maternal Grandmother  30    breast  . Hyperlipidemia Maternal Grandmother   . Hypertension Maternal Grandmother   . Cancer Maternal Grandfather     prostate  . Heart disease Paternal Grandfather     CHF  . Osteoporosis Paternal Grandmother   . Arthritis Paternal Grandmother     rheumatoid    History   Social History  . Marital Status: Single    Spouse Name: N/A    Number of Children: 0  . Years of Education: N/A   Occupational History  . disbaled    Social History Main Topics  . Smoking status: Never Smoker   . Smokeless tobacco: Never Used  . Alcohol Use: No  . Drug Use: No  . Sexual Activity: No   Other Topics Concern  . Not on file   Social History Narrative  . No narrative on file    Current Outpatient Prescriptions on File Prior to Visit  Medication Sig Dispense Refill  . AMBULATORY NON FORMULARY MEDICATION Medication Name: MIC gastrostomy/bolus feeding tube 24 French Part number 0110-24. #2 and 10 cc lurer lock syringe #2 Dx:  2 Device  1  . dantrolene (DANTRIUM) 25 MG capsule Take 25 mg by mouth 3 (three) times daily. 1 tab bid and 2 tab at qhs      .  divalproex (DEPAKOTE SPRINKLE) 125 MG capsule Take 2 capsules in morning, 5 capsules at bedtime  210 capsule  4  . Feeding Tubes - Bags (FLEXIFLO FEEDING BAG/PUMP) MISC 1 Units by Does not apply route continuous.  1 each  30  . Feeding Tubes - Pump MISC 1 Units by Does not apply route continuous.  1 each  0  . Feeding Tubes - Sets (KANGAROO EPUMP SET 1000ML) MISC 1 Units by Does not apply route continuous.  1 each  0  . fluconazole (DIFLUCAN) 40 MG/ML suspension 5 ml per GT daily x 3 dasy then 5 ml weekly x 3 weeks then weekly as needed.  100 mL  1  . LORazepam (ATIVAN) 1 MG tablet Take 1 tab po tid prn for anxiety  90 tablet  1  . Nutritional Supplements (FEEDING SUPPLEMENT, OSMOLITE 1.2 CAL,) LIQD Place 1,000 mLs into feeding tube daily.  1000 mL  30  . nystatin cream (MYCOSTATIN) Apply 1 application topically 2 (two) times daily  as needed for dry skin.  30 g  1  . OLANZapine (ZYPREXA) 5 MG tablet TAKE 1 TABLET BY MOUTH AT BEDTIME.  30 tablet  2  . omeprazole (PRILOSEC) 40 MG capsule Take 40 mg by mouth 2 (two) times daily.      Marland Kitchen PARoxetine (PAXIL) 30 MG tablet Take 30 mg by mouth daily.      . Probiotic Product (ADVANCED PROBIOTIC 10) CAPS Give 1 capsule by tube daily.       . sucralfate (CARAFATE) 1 G tablet 1 tablet twice daily via PEG  60 tablet  1   No current facility-administered medications on file prior to visit.    Allergies  Allergen Reactions  . Ambien [Zolpidem Tartrate] Nausea Only  . Codeine   . Sulfonamide Derivatives Rash    Review of Systems  Review of Systems  Constitutional: Negative for fever and malaise/fatigue.  HENT: Negative for congestion.   Eyes: Negative for discharge.  Respiratory: Negative for shortness of breath.   Cardiovascular: Negative for chest pain, palpitations and leg swelling.  Gastrointestinal: Negative for nausea, abdominal pain and diarrhea.  Genitourinary: Negative for dysuria.  Musculoskeletal: Negative for falls.  Skin: Negative for rash.  Neurological: Negative for loss of consciousness and headaches.  Endo/Heme/Allergies: Negative for polydipsia.  Psychiatric/Behavioral: Negative for depression and suicidal ideas. The patient is nervous/anxious. The patient does not have insomnia.     Objective  BP 118/70  Pulse 94  Temp(Src) 98.2 F (36.8 C) (Oral)  Ht 5\' 2"  (1.575 m)  SpO2 95%  Physical Exam  Physical Exam  Constitutional: He is oriented to person, place, and time and well-developed, well-nourished, and in no distress. No distress.  HENT:  Head: Normocephalic and atraumatic.  Eyes: Conjunctivae are normal.  Neck: Neck supple. No thyromegaly present.  Cardiovascular: Normal rate, regular rhythm, normal heart sounds and intact distal pulses.   No murmur heard. Pulmonary/Chest: Effort normal and breath sounds normal. No respiratory distress.   Abdominal: Soft. Bowel sounds are normal. He exhibits no distension and no mass. There is no tenderness.  G tube LUQ intact no surrounding erythema  Musculoskeletal: He exhibits no edema.  Neurological: He is alert and oriented to person, place, and time.  In wheelchair, spastic quadriplegia  Skin: Skin is warm.  Psychiatric: Memory, affect and judgment normal.    Lab Results  Component Value Date   TSH 0.519 11/22/2013   Lab Results  Component Value Date   WBC 9.0  11/29/2013   HGB 15.7 11/29/2013   HCT 45.8 11/29/2013   MCV 89.1 11/29/2013   PLT 304 11/29/2013   Lab Results  Component Value Date   CREATININE 0.5 01/19/2014   BUN 8 01/19/2014   NA 140 01/19/2014   K 4.1 01/19/2014   CL 103 01/19/2014   CO2 30 01/19/2014   Lab Results  Component Value Date   ALT 21 11/29/2013   AST 21 11/29/2013   ALKPHOS 64 11/29/2013   BILITOT 0.5 11/29/2013   Lab Results  Component Value Date   CHOL 151 04/25/2010   Lab Results  Component Value Date   HDL 29.90* 04/25/2010   Lab Results  Component Value Date   LDLCALC 96 04/25/2010   Lab Results  Component Value Date   TRIG 125.0 04/25/2010   Lab Results  Component Value Date   CHOLHDL 5 04/25/2010     Assessment & Plan  Decreased oral intake Doing much better. Taking po well. Using peg tube for meds and hydration.   Acute bronchitis resolved  CEREBRAL PALSY Forms completed for his ongoing programs  Generalized anxiety disorder Improved today. Has neuropsych evaluation  PALSY, INFANTILE CEREBRAL, [place] Working with PMR Dr Tessa Lerner and tolerating Botox injections in hips, shoulders, etc with good results  Unspecified urinary incontinence Tolerating Ditropan

## 2014-04-01 NOTE — Assessment & Plan Note (Signed)
Working with PMR Dr Tessa Lerner and tolerating Botox injections in hips, shoulders, etc with good results

## 2014-04-01 NOTE — Assessment & Plan Note (Signed)
resolved 

## 2014-04-01 NOTE — Assessment & Plan Note (Signed)
Improved today. Has neuropsych evaluation

## 2014-04-01 NOTE — Assessment & Plan Note (Signed)
Forms completed for his ongoing programs

## 2014-04-01 NOTE — Assessment & Plan Note (Signed)
Tolerating Ditropan

## 2014-04-01 NOTE — Assessment & Plan Note (Signed)
Doing much better. Taking po well. Using peg tube for meds and hydration.

## 2014-04-10 ENCOUNTER — Telehealth: Payer: Self-pay | Admitting: Family Medicine

## 2014-04-10 ENCOUNTER — Other Ambulatory Visit: Payer: Self-pay | Admitting: Family Medicine

## 2014-04-10 MED ORDER — LORAZEPAM 1 MG PO TABS: ORAL_TABLET | ORAL | Status: DC

## 2014-04-10 NOTE — Telephone Encounter (Signed)
Refill- lorazepam  [facility]

## 2014-04-10 NOTE — Telephone Encounter (Signed)
Last RX was done on 01-23-14 quantity 90 with 1 refill  rx printed for md to sign and fax

## 2014-04-24 ENCOUNTER — Encounter: Payer: Medicare Other | Attending: Physical Medicine & Rehabilitation | Admitting: Physical Medicine & Rehabilitation

## 2014-04-24 ENCOUNTER — Encounter: Payer: Self-pay | Admitting: Physical Medicine & Rehabilitation

## 2014-04-24 VITALS — Resp 16

## 2014-04-24 DIAGNOSIS — F411 Generalized anxiety disorder: Secondary | ICD-10-CM | POA: Diagnosis not present

## 2014-04-24 DIAGNOSIS — G243 Spasmodic torticollis: Secondary | ICD-10-CM | POA: Diagnosis not present

## 2014-04-24 DIAGNOSIS — G8250 Quadriplegia, unspecified: Secondary | ICD-10-CM | POA: Diagnosis not present

## 2014-04-24 DIAGNOSIS — G809 Cerebral palsy, unspecified: Secondary | ICD-10-CM | POA: Diagnosis not present

## 2014-04-24 MED ORDER — DANTROLENE SODIUM 50 MG PO CAPS: 50.0000 mg | ORAL_CAPSULE | Freq: Four times a day (QID) | ORAL | Status: DC

## 2014-04-24 MED ORDER — DIAZEPAM 2 MG PO TABS: 1.0000 mg | ORAL_TABLET | Freq: Every evening | ORAL | Status: DC | PRN

## 2014-04-24 NOTE — Patient Instructions (Signed)
PLEASE CALL ME WITH ANY PROBLEMS OR QUESTIONS (#[phone]).     TAKE 50MG  DANTRIUM IN THE MORNING AND AFTERNOON AND 50MG  AT NIGHT FOR 2 WEEKS. INCREASE TO 100MG  AT NIGHT IF HE'S TOLERATING MEDICATION AND SPASTICITY IS STILL PRESENT

## 2014-04-24 NOTE — Progress Notes (Signed)
Subjective:    Patient ID: [name], male    DOB: 1986/05/02, 28 y.o.   MRN: [id number]  HPI  [name] is back regarding his CP and spastic tetraplegia. He continues to have good results with the botox. His neck rom and hip rom is better. His pain levels are down. [name] has noticed improvement with the adjustment in his dantrium and valium as well.  Dantrium has been increased to 25mg  bid and 50mg  hs and valium was weaned to 2.5mg  qhs.   He is more alert and communicating more. He seems happier as a whole.  [name] tried to stop depakote and valium but he developed some anxiety so she resumed his current dosing which is 250mg  during the day and 625mg  at night in addition to the 2.5mg  valium  Pain Inventory Average Pain 2 Pain Right Now 0 My pain is intermittent and tingling  In the last 24 hours, has pain interfered with the following? General activity 0 Relation with others 0 Enjoyment of life 0 What TIME of day is your pain at its worst? night Sleep (in general) Good  Pain is worse with: bending and standing Pain improves with: rest, heat/ice, medication and injections Relief from Meds: 2  Mobility use a wheelchair  Function disabled: date disabled . I need assistance with the following:  feeding, dressing, bathing, toileting, meal prep, household duties and shopping Do you have any goals in this area?  no  Neuro/Psych bladder control problems weakness spasms  Prior Studies Any changes since last visit?  no  Physicians involved in your care Any changes since last visit?  no   Family History  Problem Relation Age of Onset  . Asthma Mother   . Hyperlipidemia Mother   . COPD Mother   . Other Mother     bronchial stasis/ABPA  . Cancer Maternal Grandmother 53    breast  . Hyperlipidemia Maternal Grandmother   . Hypertension Maternal Grandmother   . Cancer Maternal Grandfather     prostate  . Heart disease Paternal Grandfather     CHF  . Osteoporosis  Paternal Grandmother   . Arthritis Paternal Grandmother     rheumatoid   History   Social History  . Marital Status: Single    Spouse Name: N/A    Number of Children: 0  . Years of Education: N/A   Occupational History  . disbaled    Social History Main Topics  . Smoking status: Never Smoker   . Smokeless tobacco: Never Used  . Alcohol Use: No  . Drug Use: No  . Sexual Activity: No   Other Topics Concern  . None   Social History Narrative  . None   Past Surgical History  Procedure Laterality Date  . Spine surgery  ,11/20/2010, 2011  . Eye surgery    . Ears tubes    . Hamstring released    . Baclofen trial    . Baslofen pump implant    . Spinal fusion    . G-tube insert  August 2006  . Spinal fusioncorrect 106 degree kyphosis    . Spinal fusion to correct 70 degree kyphosis    . Tonsillectomy    . Peg placement  10/21/2011    Procedure: PERCUTANEOUS ENDOSCOPIC GASTROSTOMY (PEG) REPLACEMENT;  Surgeon: [name], MD;  Location: [facility];  Service: Endoscopy;  Laterality: N/A;  . Peg placement N/A 06/13/2013    Procedure: PERCUTANEOUS ENDOSCOPIC GASTROSTOMY (PEG) REPLACEMENT;  Surgeon: [name],  MD;  Location: [facility];  Service: Endoscopy;  Laterality: N/A;   Past Medical History  Diagnosis Date  . Cerebral palsy   . GERD (gastroesophageal reflux disease)   . Anxiety   . Depression   . Thyroid disease     hyper  . Incontinence of feces   . Palpitations   . Esophagitis   . Dehydration 11/22/2013   BP   Pulse   Resp 16  Opioid Risk Score:   Fall Risk Score:  (patient educated handout declined)   Review of Systems  Genitourinary: Positive for difficulty urinating.  Neurological: Positive for weakness.       Spasms  All other systems reviewed and are negative.      Objective:   Physical Exam  General: Alert and oriented x 3, No apparent distress. He is sitting in a motorized chair with head rest and trunk supports. He appears to be  in good spirits.  HEENT: Head is normocephalic, atraumatic, PERRLA, EOMI, sclera anicteric, oral mucosa pink and moist, dentition intact, ext ear canals clear,  Neck: Supple without JVD or lymphadenopathy  Heart: Reg rate and rhythm. No murmurs rubs or gallops  Chest: CTA bilaterally without wheezes, rales, or rhonchi; no distress  Abdomen: Soft, non-tender, non-distended, bowel sounds positive.  Extremities: No clubbing, cyanosis, or edema. Pulses are 2+  Skin: Clean and intact without signs of breakdown although there is an area of red/demarcated macular rash around the tube site.  Neuro:He has improved oralmotor control. He appears much more alert. His eye contact his better. Posture is better. He holds his head more in a neutral position although it remains rotated and bent to the left.. Tone in the UE is 3-4/4 pec major/minor, 3/4 bicep and wrist/HI--p  He had fluctuating tone in his hamstrings, quads 2-3/4, HAD's at1+/4. When supine he was able to lay surprisingly flat. Heel cords were tight as well at 3/4. Pt with minimal volitional movement on exam. He can sense pain in the trunk and extremities  Musculoskeletal: low back, trunk and cervical ROM is limited. He has kyphosis.  . Both traps and SCM's are tight, but improved from baseline. Head posture much improved He has tightness in his hip adductors and Hip flexors as well as hamstrings.  Psych: Pt's affect is appropriate. Pt is cooperative given his cognitive linguistic issues. Does understand basic commands and answers questions although his speech is difficult to understand.    Assessment & Plan:  1. CP with spastic tetraplegia  2. 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 Severe oro-pharyngeal dysphagia with G-tube.    Plan:  1. Will set up botox for his SCM's, traps, and hip adductors, QUADS  for July or August. 500 U in total. 2. Increase HS  dantrium to 50mg  bid and 100mg  qhs ultimately. Weaning valium to off. Would maintain depakote at current dosing for now. 3. Lidoderm patches, 5mg , 2 over the lower lumbar spine, nightly before bed.  4. Follow up in about a month. 25 minutes of face to face patient care time were spent during this visit. All questions were encouraged and answered.

## 2014-04-25 ENCOUNTER — Encounter: Payer: Self-pay | Admitting: Physical Medicine & Rehabilitation

## 2014-05-09 ENCOUNTER — Telehealth: Payer: Self-pay | Admitting: Family Medicine

## 2014-05-09 MED ORDER — OLANZAPINE 5 MG PO TABS: ORAL_TABLET | ORAL | Status: DC

## 2014-05-09 NOTE — Telephone Encounter (Signed)
Refill-olanzapine  [facility]

## 2014-05-09 NOTE — Telephone Encounter (Signed)
Refill sent to pharmacy.   

## 2014-05-10 ENCOUNTER — Telehealth: Payer: Self-pay

## 2014-05-10 NOTE — Telephone Encounter (Signed)
Message sent to mychart

## 2014-05-10 NOTE — Telephone Encounter (Signed)
OK to try the Mucinex with Tylenol OTC via Gtube and see if that helps

## 2014-05-10 NOTE — Telephone Encounter (Signed)
Cough since last night- not sure if she should give pt OTC mucinex cold formula with acetaminophen through his tube? No temp at 10 am this morning but has a temp now 100.5  pts mother states she has a scratchy throat also  Please advise?

## 2014-05-11 ENCOUNTER — Encounter: Payer: Self-pay | Admitting: Physician Assistant

## 2014-05-11 ENCOUNTER — Ambulatory Visit (INDEPENDENT_AMBULATORY_CARE_PROVIDER_SITE_OTHER): Payer: Medicare Other | Admitting: Physician Assistant

## 2014-05-11 ENCOUNTER — Ambulatory Visit: Payer: Medicare Other | Admitting: Physician Assistant

## 2014-05-11 VITALS — BP 110/83 | HR 97 | Temp 99.7°F | Resp 20 | Ht 62.0 in

## 2014-05-11 DIAGNOSIS — J209 Acute bronchitis, unspecified: Secondary | ICD-10-CM

## 2014-05-11 DIAGNOSIS — H65199 Other acute nonsuppurative otitis media, unspecified ear: Secondary | ICD-10-CM

## 2014-05-11 DIAGNOSIS — H65191 Other acute nonsuppurative otitis media, right ear: Secondary | ICD-10-CM

## 2014-05-11 MED ORDER — AZITHROMYCIN 200 MG/5ML PO SUSR: ORAL | Status: DC

## 2014-05-11 NOTE — Progress Notes (Signed)
Pre visit review using our clinic review tool, if applicable. No additional management support is needed unless otherwise documented below in the visit note/SLS  

## 2014-05-11 NOTE — Patient Instructions (Signed)
Please take Azithromycin as directed.  Increase fluid intake.  Rest.  Continue good protein intake.  Can use over-the-counter cough aids.  Please place a humidifier in his bedroom.  Continue other medications as directed.  Please call or return to clinic if symptoms are not improving.

## 2014-05-13 DIAGNOSIS — H65191 Other acute nonsuppurative otitis media, right ear: Secondary | ICD-10-CM | POA: Insufficient documentation

## 2014-05-13 NOTE — Assessment & Plan Note (Signed)
With bacterial bronchitis.  Rx Azithromycin.

## 2014-05-13 NOTE — Progress Notes (Signed)
Patient presents to clinic today with his mother who notes patient has had chest congestion, productive cough and low-grade fever for several days.  Denies SOB or chest pain. Denies change to bowel or bladder habits.  Denies recent travel or sick contact.  Mother has noticed some increased agitation over the past few days due to illness.  Patient is tolerating feedings at present.  Past Medical History  Diagnosis Date  . Cerebral palsy   . GERD (gastroesophageal reflux disease)   . Anxiety   . Depression   . Thyroid disease     hyper  . Incontinence of feces   . Palpitations   . Esophagitis   . Dehydration 11/22/2013    Current Outpatient Prescriptions on File Prior to Visit  Medication Sig Dispense Refill  . AMBULATORY NON FORMULARY MEDICATION Medication Name: MIC gastrostomy/bolus feeding tube 24 French Part number 0110-24. #2 and 10 cc lurer lock syringe #2 Dx:  2 Device  1  . clotrimazole-betamethasone (LOTRISONE) cream Apply 1 application topically 2 (two) times daily.  45 g  1  . dantrolene (DANTRIUM) 50 MG capsule Take 1 capsule (50 mg total) by mouth 4 (four) times daily. 1 tab bid and 2 tab at qhs  120 capsule  4  . diazepam (VALIUM) 2 MG tablet Take 0.5-1 tablets (1-2 mg total) by mouth at bedtime as needed for anxiety.  30 tablet  1  . divalproex (DEPAKOTE SPRINKLE) 125 MG capsule Take 2 capsules in morning, 5 capsules at bedtime  210 capsule  4  . Feeding Tubes - Bags (FLEXIFLO FEEDING BAG/PUMP) MISC 1 Units by Does not apply route continuous.  1 each  30  . Feeding Tubes - Pump MISC 1 Units by Does not apply route continuous.  1 each  0  . Feeding Tubes - Sets (KANGAROO EPUMP SET 1000ML) MISC 1 Units by Does not apply route continuous.  1 each  0  . fluconazole (DIFLUCAN) 40 MG/ML suspension 5 ml per GT daily x 3 dasy then 5 ml weekly x 3 weeks then weekly as needed.  100 mL  1  . LORazepam (ATIVAN) 1 MG tablet Take 1 tab po tid prn for anxiety  90 tablet  1  . Nutritional  Supplements (FEEDING SUPPLEMENT, OSMOLITE 1.2 CAL,) LIQD Place 1,000 mLs into feeding tube daily.  1000 mL  30  . nystatin cream (MYCOSTATIN) Apply 1 application topically 2 (two) times daily as needed for dry skin.  30 g  1  . OLANZapine (ZYPREXA) 5 MG tablet TAKE 1 TABLET BY MOUTH AT BEDTIME.  30 tablet  3  . omeprazole (PRILOSEC) 40 MG capsule Take 40 mg by mouth 2 (two) times daily.      Marland Kitchen PARoxetine (PAXIL) 30 MG tablet TAKE 1 TABLET BY MOUTH AT BEDTIME.  30 tablet  2  . Probiotic Product (ADVANCED PROBIOTIC 10) CAPS Give 1 capsule by tube daily.       . sucralfate (CARAFATE) 1 G tablet 1 tablet twice daily via PEG  60 tablet  1   No current facility-administered medications on file prior to visit.    Allergies  Allergen Reactions  . Ambien [Zolpidem Tartrate] Nausea Only  . Codeine   . Baclofen Anxiety  . Diazepam Anxiety  . Sulfonamide Derivatives Rash    Family History  Problem Relation Age of Onset  . Asthma Mother   . Hyperlipidemia Mother   . COPD Mother   . Other Mother     bronchial  stasis/ABPA  . Cancer Maternal Grandmother 81    breast  . Hyperlipidemia Maternal Grandmother   . Hypertension Maternal Grandmother   . Cancer Maternal Grandfather     prostate  . Heart disease Paternal Grandfather     CHF  . Osteoporosis Paternal Grandmother   . Arthritis Paternal Grandmother     rheumatoid    History   Social History  . Marital Status: Single    Spouse Name: N/A    Number of Children: 0  . Years of Education: N/A   Occupational History  . disbaled    Social History Main Topics  . Smoking status: Never Smoker   . Smokeless tobacco: Never Used  . Alcohol Use: No  . Drug Use: No  . Sexual Activity: No   Other Topics Concern  . None   Social History Narrative  . None    Review of Systems - See HPI.  All other ROS are negative.  BP 110/83  Pulse 97  Temp(Src) 99.7 F (37.6 C) (Oral)  Resp 20  Ht 5\' 2"  (1.575 m)  SpO2 95%  Physical Exam   Vitals reviewed. Constitutional: He is well-developed, well-nourished, and in no distress.  HENT:  Head: Normocephalic and atraumatic.  Right Ear: External ear normal.  Left Ear: External ear normal.  Nose: Nose normal.  Mouth/Throat: Uvula is midline, oropharynx is clear and moist and mucous membranes are normal. No oropharyngeal exudate.  R TM erythematous and bulging.  L TM within normal limits.   Eyes: Conjunctivae are normal. Pupils are equal, round, and reactive to light.  Neck: Neck supple.  Cardiovascular: Normal rate, regular rhythm, normal heart sounds and intact distal pulses.   Pulmonary/Chest: Effort normal and breath sounds normal. No respiratory distress. He has no wheezes. He has no rales. He exhibits no tenderness.  Lymphadenopathy:    He has no cervical adenopathy.  Neurological: He is alert.  Skin: Skin is warm and dry. No rash noted.    No results found for this or any previous visit (from the past 2160 hour(s)).  Assessment/Plan: Acute nonsuppurative otitis media of right ear With bacterial bronchitis.  Rx Azithromycin.   Acute bronchitis Rx Azithromycin suspension.  Increase fluids.  Daily MTV.  Place a humidifier in bedroom.  Delsym for cough.  Return precautions discussed with mother.

## 2014-05-13 NOTE — Assessment & Plan Note (Signed)
Rx Azithromycin suspension.  Increase fluids.  Daily MTV.  Place a humidifier in bedroom.  Delsym for cough.  Return precautions discussed with mother.

## 2014-05-14 ENCOUNTER — Other Ambulatory Visit: Payer: Self-pay | Admitting: Physician Assistant

## 2014-05-14 ENCOUNTER — Encounter: Payer: Self-pay | Admitting: Physician Assistant

## 2014-05-14 DIAGNOSIS — J209 Acute bronchitis, unspecified: Secondary | ICD-10-CM

## 2014-05-14 DIAGNOSIS — R05 Cough: Secondary | ICD-10-CM

## 2014-05-14 DIAGNOSIS — R053 Chronic cough: Secondary | ICD-10-CM

## 2014-05-14 MED ORDER — HYDROCODONE-HOMATROPINE 5-1.5 MG/5ML PO SYRP: 5.0000 mL | ORAL_SOLUTION | Freq: Three times a day (TID) | ORAL | Status: DC | PRN

## 2014-05-17 ENCOUNTER — Encounter: Payer: Self-pay | Admitting: Physician Assistant

## 2014-05-17 ENCOUNTER — Encounter (HOSPITAL_BASED_OUTPATIENT_CLINIC_OR_DEPARTMENT_OTHER): Payer: Self-pay | Admitting: Emergency Medicine

## 2014-05-17 ENCOUNTER — Emergency Department (HOSPITAL_BASED_OUTPATIENT_CLINIC_OR_DEPARTMENT_OTHER)
Admission: EM | Admit: 2014-05-17 | Discharge: 2014-05-17 | Disposition: A | Discharge status: Home / Self Care | Payer: Medicare Other | Attending: Emergency Medicine | Admitting: Emergency Medicine

## 2014-05-17 ENCOUNTER — Emergency Department (HOSPITAL_BASED_OUTPATIENT_CLINIC_OR_DEPARTMENT_OTHER): Payer: Medicare Other

## 2014-05-17 ENCOUNTER — Ambulatory Visit (INDEPENDENT_AMBULATORY_CARE_PROVIDER_SITE_OTHER): Payer: Medicare Other | Admitting: Physician Assistant

## 2014-05-17 ENCOUNTER — Ambulatory Visit: Payer: Medicare Other | Admitting: Medical

## 2014-05-17 VITALS — BP 108/80 | HR 133 | Temp 98.1°F

## 2014-05-17 DIAGNOSIS — Z79899 Other long term (current) drug therapy: Secondary | ICD-10-CM | POA: Diagnosis not present

## 2014-05-17 DIAGNOSIS — R059 Cough, unspecified: Secondary | ICD-10-CM | POA: Diagnosis not present

## 2014-05-17 DIAGNOSIS — F329 Major depressive disorder, single episode, unspecified: Secondary | ICD-10-CM | POA: Diagnosis not present

## 2014-05-17 DIAGNOSIS — Z8639 Personal history of other endocrine, nutritional and metabolic disease: Secondary | ICD-10-CM | POA: Diagnosis not present

## 2014-05-17 DIAGNOSIS — K219 Gastro-esophageal reflux disease without esophagitis: Secondary | ICD-10-CM | POA: Diagnosis not present

## 2014-05-17 DIAGNOSIS — Z862 Personal history of diseases of the blood and blood-forming organs and certain disorders involving the immune mechanism: Secondary | ICD-10-CM | POA: Diagnosis not present

## 2014-05-17 DIAGNOSIS — J209 Acute bronchitis, unspecified: Secondary | ICD-10-CM | POA: Diagnosis not present

## 2014-05-17 DIAGNOSIS — R0602 Shortness of breath: Secondary | ICD-10-CM | POA: Diagnosis not present

## 2014-05-17 DIAGNOSIS — IMO0002 Reserved for concepts with insufficient information to code with codable children: Secondary | ICD-10-CM | POA: Diagnosis not present

## 2014-05-17 DIAGNOSIS — E860 Dehydration: Secondary | ICD-10-CM

## 2014-05-17 DIAGNOSIS — Z8669 Personal history of other diseases of the nervous system and sense organs: Secondary | ICD-10-CM | POA: Insufficient documentation

## 2014-05-17 DIAGNOSIS — F3289 Other specified depressive episodes: Secondary | ICD-10-CM | POA: Diagnosis not present

## 2014-05-17 DIAGNOSIS — F411 Generalized anxiety disorder: Secondary | ICD-10-CM | POA: Diagnosis not present

## 2014-05-17 DIAGNOSIS — R000 Tachycardia, unspecified: Secondary | ICD-10-CM | POA: Insufficient documentation

## 2014-05-17 DIAGNOSIS — R05 Cough: Secondary | ICD-10-CM

## 2014-05-17 LAB — CBC WITH DIFFERENTIAL/PLATELET
Basophils Absolute: 0 10*3/uL (ref 0.0–0.1)
Basophils Relative: 0 % (ref 0–1)
Eosinophils Absolute: 0 10*3/uL (ref 0.0–0.7)
Eosinophils Relative: 0 % (ref 0–5)
HCT: 43.5 % (ref 39.0–52.0)
Hemoglobin: 14.3 g/dL (ref 13.0–17.0)
Lymphocytes Relative: 11 % — ABNORMAL LOW (ref 12–46)
Lymphs Abs: 0.7 10*3/uL (ref 0.7–4.0)
MCH: 28.7 pg (ref 26.0–34.0)
MCHC: 32.9 g/dL (ref 30.0–36.0)
MCV: 87.2 fL (ref 78.0–100.0)
Monocytes Absolute: 0 10*3/uL — ABNORMAL LOW (ref 0.1–1.0)
Monocytes Relative: 1 % — ABNORMAL LOW (ref 3–12)
Neutro Abs: 5.1 10*3/uL (ref 1.7–7.7)
Neutrophils Relative %: 88 % — ABNORMAL HIGH (ref 43–77)
Platelets: 249 10*3/uL (ref 150–400)
RBC: 4.99 MIL/uL (ref 4.22–5.81)
RDW: 13.3 % (ref 11.5–15.5)
WBC: 5.8 10*3/uL (ref 4.0–10.5)

## 2014-05-17 LAB — COMPREHENSIVE METABOLIC PANEL
ALT: 8 U/L (ref 0–53)
AST: 13 U/L (ref 0–37)
Albumin: 4 g/dL (ref 3.5–5.2)
Alkaline Phosphatase: 65 U/L (ref 39–117)
Anion gap: 16 — ABNORMAL HIGH (ref 5–15)
BUN: 8 mg/dL (ref 6–23)
CO2: 26 mEq/L (ref 19–32)
Calcium: 9.7 mg/dL (ref 8.4–10.5)
Chloride: 99 mEq/L (ref 96–112)
Creatinine, Ser: 0.4 mg/dL — ABNORMAL LOW (ref 0.50–1.35)
GFR calc Af Amer: >90 mL/min (ref >90)
GFR calc non Af Amer: >90 mL/min (ref >90)
Glucose, Bld: 131 mg/dL — ABNORMAL HIGH (ref 70–99)
Potassium: 4.1 mEq/L (ref 3.7–5.3)
Sodium: 141 mEq/L (ref 137–147)
Total Bilirubin: 0.3 mg/dL (ref 0.3–1.2)
Total Protein: 8.3 g/dL (ref 6.0–8.3)

## 2014-05-17 LAB — I-STAT CG4 LACTIC ACID, ED: Lactic Acid, Venous: 1.17 mmol/L (ref 0.5–2.2)

## 2014-05-17 MED ORDER — HYDROCOD POLST-CHLORPHEN POLST 10-8 MG/5ML PO LQCR: 5.0000 mL | Freq: Two times a day (BID) | ORAL | Status: DC | PRN

## 2014-05-17 MED ORDER — ALBUTEROL SULFATE (2.5 MG/3ML) 0.083% IN NEBU
5.0000 mg | INHALATION_SOLUTION | Freq: Once | RESPIRATORY_TRACT | Status: AC
Start: 2014-05-17 — End: 2014-05-17
  Administered 2014-05-17: 5 mg via RESPIRATORY_TRACT
  Filled 2014-05-17: qty 6

## 2014-05-17 MED ORDER — LORAZEPAM 2 MG/ML IJ SOLN
1.0000 mg | Freq: Once | INTRAMUSCULAR | Status: AC
  Administered 2014-05-17: 1 mg via INTRAVENOUS
  Filled 2014-05-17: qty 1

## 2014-05-17 MED ORDER — FEEDING TUBES - PUMP MISC: 1.0000 [IU] | Status: DC

## 2014-05-17 MED ORDER — SODIUM CHLORIDE 0.9 % IV BOLUS (SEPSIS)
1000.0000 mL | Freq: Once | INTRAVENOUS | Status: AC
  Administered 2014-05-17: 1000 mL via INTRAVENOUS

## 2014-05-17 NOTE — Discharge Instructions (Signed)
Cough, Adult  A cough is a reflex that helps clear your throat and airways. It can help heal the body or may be a reaction to an irritated airway. A cough may only last 2 or 3 weeks (acute) or may last more than 8 weeks (chronic).  CAUSES Acute cough:  Viral or bacterial infections. Chronic cough:  Infections.  Allergies.  Asthma.  Post-nasal drip.  Smoking.  Heartburn or acid reflux.  Some medicines.  Chronic lung problems (COPD).  Cancer. SYMPTOMS   Cough.  Fever.  Chest pain.  Increased breathing rate.  High-pitched whistling sound when breathing (wheezing).  Colored mucus that you cough up (sputum). TREATMENT   A bacterial cough may be treated with antibiotic medicine.  A viral cough must run its course and will not respond to antibiotics.  Your caregiver may recommend other treatments if you have a chronic cough. HOME CARE INSTRUCTIONS   Only take over-the-counter or prescription medicines for pain, discomfort, or fever as directed by your caregiver. Use cough suppressants only as directed by your caregiver.  Use a cold steam vaporizer or humidifier in your bedroom or home to help loosen secretions.  Sleep in a semi-upright position if your cough is worse at night.  Rest as needed.  Stop smoking if you smoke. SEEK IMMEDIATE MEDICAL CARE IF:   You have pus in your sputum.  Your cough starts to worsen.  You cannot control your cough with suppressants and are losing sleep.  You begin coughing up blood.  You have difficulty breathing.  You develop pain which is getting worse or is uncontrolled with medicine.  You have a fever. MAKE SURE YOU:   Understand these instructions.  Will watch your condition.  Will get help right away if you are not doing well or get worse. Document Released: 04/17/2011 Document Revised: 01/11/2012 Document Reviewed: 04/17/2011 Exit[employer] Patient Information 2015 [employer]. This information is not intended  to replace advice given to you by your health care provider. Make sure you discuss any questions you have with your health care provider.  

## 2014-05-17 NOTE — Assessment & Plan Note (Addendum)
Patient in distress. Pulse at 133. Noted signs of dehydration. O2 will not get higher than 95%.  Patient with increased risk for aspiration pneumonia.  I have concerns of this. Patient triaged to ER for IV hydration, tube feedings and further acute evaluation.

## 2014-05-17 NOTE — Progress Notes (Signed)
Patient presents to clinic today with mother who notes worsening cough and congestion with SOB.  Patient is a quadriplegic with cerebral palsy and is unable to verbalize how he is feeling.  Patient was seen last week and diagnosed with bronchitis and OM.  Was given cough syrup and Rx for Azithromycin suspension.  Mother denies improvement in respiratory symptoms except for resolution of fever after 4 days.  States patient having pain with breathing and very labored breathing.  Concerns for aspiration pneumonia.  Has not been tolerating PO feedings.  Mother has been giving tube feedings via bolus, but has not been able to give full amount of nutrients.    Past Medical History  Diagnosis Date  . Cerebral palsy   . GERD (gastroesophageal reflux disease)   . Anxiety   . Depression   . Thyroid disease     hyper  . Incontinence of feces   . Palpitations   . Esophagitis   . Dehydration 11/22/2013    Current Outpatient Prescriptions on File Prior to Visit  Medication Sig Dispense Refill  . AMBULATORY NON FORMULARY MEDICATION Medication Name: MIC gastrostomy/bolus feeding tube 24 French Part number 0110-24. #2 and 10 cc lurer lock syringe #2 Dx:  2 Device  1  . azithromycin (ZITHROMAX) 200 MG/5ML suspension Take 12 mL by mouth on day 1.  Then take 6 mL by mouth for 4 more days.  45 mL  0  . clotrimazole-betamethasone (LOTRISONE) cream Apply 1 application topically 2 (two) times daily.  45 g  1  . dantrolene (DANTRIUM) 50 MG capsule Take 1 capsule (50 mg total) by mouth 4 (four) times daily. 1 tab bid and 2 tab at qhs  120 capsule  4  . diazepam (VALIUM) 2 MG tablet Take 0.5-1 tablets (1-2 mg total) by mouth at bedtime as needed for anxiety.  30 tablet  1  . divalproex (DEPAKOTE SPRINKLE) 125 MG capsule Take 2 capsules in morning, 5 capsules at bedtime  210 capsule  4  . Feeding Tubes - Bags (FLEXIFLO FEEDING BAG/PUMP) MISC 1 Units by Does not apply route continuous.  1 each  30  . Feeding Tubes -  Sets (KANGAROO EPUMP SET 1000ML) MISC 1 Units by Does not apply route continuous.  1 each  0  . fluconazole (DIFLUCAN) 40 MG/ML suspension 5 ml per GT daily x 3 dasy then 5 ml weekly x 3 weeks then weekly as needed.  100 mL  1  . LORazepam (ATIVAN) 1 MG tablet Take 1 tab po tid prn for anxiety  90 tablet  1  . Nutritional Supplements (FEEDING SUPPLEMENT, OSMOLITE 1.2 CAL,) LIQD Place 1,000 mLs into feeding tube daily.  1000 mL  30  . nystatin cream (MYCOSTATIN) Apply 1 application topically 2 (two) times daily as needed for dry skin.  30 g  1  . OLANZapine (ZYPREXA) 5 MG tablet TAKE 1 TABLET BY MOUTH AT BEDTIME.  30 tablet  3  . omeprazole (PRILOSEC) 40 MG capsule Take 40 mg by mouth 2 (two) times daily.      Marland Kitchen PARoxetine (PAXIL) 30 MG tablet TAKE 1 TABLET BY MOUTH AT BEDTIME.  30 tablet  2  . Probiotic Product (ADVANCED PROBIOTIC 10) CAPS Give 1 capsule by tube daily.       . sucralfate (CARAFATE) 1 G tablet 1 tablet twice daily via PEG  60 tablet  1   No current facility-administered medications on file prior to visit.    Allergies  Allergen  Reactions  . Ambien [Zolpidem Tartrate] Nausea Only  . Codeine   . Baclofen Anxiety  . Diazepam Anxiety  . Sulfonamide Derivatives Rash    Family History  Problem Relation Age of Onset  . Asthma Mother   . Hyperlipidemia Mother   . COPD Mother   . Other Mother     bronchial stasis/ABPA  . Cancer Maternal Grandmother 8    breast  . Hyperlipidemia Maternal Grandmother   . Hypertension Maternal Grandmother   . Cancer Maternal Grandfather     prostate  . Heart disease Paternal Grandfather     CHF  . Osteoporosis Paternal Grandmother   . Arthritis Paternal Grandmother     rheumatoid    History   Social History  . Marital Status: Single    Spouse Name: N/A    Number of Children: 0  . Years of Education: N/A   Occupational History  . disbaled    Social History Main Topics  . Smoking status: Never Smoker   . Smokeless tobacco:  Never Used  . Alcohol Use: No  . Drug Use: No  . Sexual Activity: No   Other Topics Concern  . None   Social History Narrative  . None   Review of Systems - See HPI.  All other ROS are negative.  BP 108/80  Pulse 133  Temp(Src) 98.1 F (36.7 C) (Axillary)  SpO2 95%  Physical Exam  Vitals reviewed. Constitutional: He is oriented to person, place, and time.  Patient in wheelchair with notable agitation and emotional distress.  HENT:  Head: Normocephalic and atraumatic.  Right Ear: Tympanic membrane, external ear and ear canal normal.  Left Ear: Tympanic membrane, external ear and ear canal normal.  Nose: Nose normal.  Mouth/Throat: Uvula is midline. Mucous membranes are dry. No oropharyngeal exudate.  Eyes: Conjunctivae are normal.  Neck: Neck supple.  Cardiovascular: Regular rhythm, normal heart sounds and intact distal pulses.   Tachycardic.  Pulmonary/Chest: No respiratory distress. He has wheezes. He has no rales. He exhibits no tenderness.  Lymphadenopathy:    He has no cervical adenopathy.  Neurological: He is alert and oriented to person, place, and time.  Skin: Skin is warm and dry. No rash noted.   Assessment/Plan: Acute bronchitis Patient in distress. Pulse at 133. Noted signs of dehydration. O2 will not get higher than 95%.  Patient with increased risk for aspiration pneumonia.  I have concerns of this. Patient triaged to ER for IV hydration, tube feedings and further acute evaluation.  Dehydration No tolerating PO fluids well due to cough.  Mother unable to give adequate tube feedings.  Triaged to ER for hydration and evaluation.

## 2014-05-17 NOTE — ED Provider Notes (Signed)
CSN: [id number]     Arrival date & time 05/17/14  1549 History   First MD Initiated Contact with Patient 05/17/14 1603     Chief Complaint  Patient presents with  . Shortness of Breath     (Consider location/radiation/quality/duration/timing/severity/associated sxs/prior Treatment) HPI 28 year old male with a history of cerebral palsy presents with family from clinic with continued cough. The patient has been having dry lips over the last couple days. The cough is also seemed to worsen the last couple days. Started about 8 days ago for the first 4 days he had a fever, his maximum temperature was 101.6. Since then he has not had a fever. He has occasionally appeared short of breath. He is acting at his normal mental baseline. Due to patient being nonverbal he is unable to give any history. They have been trying a liquid anti-tussive with no relief. The patient has occasionally been wheezing as well.  Past Medical History  Diagnosis Date  . Cerebral palsy   . GERD (gastroesophageal reflux disease)   . Anxiety   . Depression   . Thyroid disease     hyper  . Incontinence of feces   . Palpitations   . Esophagitis   . Dehydration 11/22/2013   Past Surgical History  Procedure Laterality Date  . Spine surgery  ,11/20/2010, 2011  . Eye surgery    . Ears tubes    . Hamstring released    . Baclofen trial    . Baslofen pump implant    . Spinal fusion    . G-tube insert  August 2006  . Spinal fusioncorrect 106 degree kyphosis    . Spinal fusion to correct 70 degree kyphosis    . Tonsillectomy    . Peg placement  10/21/2011    Procedure: PERCUTANEOUS ENDOSCOPIC GASTROSTOMY (PEG) REPLACEMENT;  Surgeon: [name], MD;  Location: [facility];  Service: Endoscopy;  Laterality: N/A;  . Peg placement N/A 06/13/2013    Procedure: PERCUTANEOUS ENDOSCOPIC GASTROSTOMY (PEG) REPLACEMENT;  Surgeon: [name], MD;  Location: [facility];  Service: Endoscopy;  Laterality: N/A;   Family History   Problem Relation Age of Onset  . Asthma Mother   . Hyperlipidemia Mother   . COPD Mother   . Other Mother     bronchial stasis/ABPA  . Cancer Maternal Grandmother 64    breast  . Hyperlipidemia Maternal Grandmother   . Hypertension Maternal Grandmother   . Cancer Maternal Grandfather     prostate  . Heart disease Paternal Grandfather     CHF  . Osteoporosis Paternal Grandmother   . Arthritis Paternal Grandmother     rheumatoid   History  Substance Use Topics  . Smoking status: Never Smoker   . Smokeless tobacco: Never Used  . Alcohol Use: No    Review of Systems  Unable to perform ROS: Patient nonverbal      Allergies  Ambien; Codeine; Baclofen; Diazepam; and Sulfonamide derivatives  Home Medications   Prior to Admission medications   Medication Sig Start Date End Date Taking? Authorizing Provider  AMBULATORY NON FORMULARY MEDICATION Medication Name: MIC gastrostomy/bolus feeding tube 24 French Part number 0110-24. #2 and 10 cc lurer lock syringe #2 Dx: 11/27/13   Nita Sells, MD  azithromycin (ZITHROMAX) 200 MG/5ML suspension Take 12 mL by mouth on day 1.  Then take 6 mL by mouth for 4 more days. 05/11/14   Leeanne Rio, PA-C  chlorpheniramine-HYDROcodone (TUSSIONEX PENNKINETIC ER) 10-8 MG/5ML [facility] Take 5  mLs by mouth every 12 (twelve) hours as needed. 05/17/14   Leeanne Rio, PA-C  clotrimazole-betamethasone (LOTRISONE) cream Apply 1 application topically 2 (two) times daily. 03/27/14   [name], MD  dantrolene (DANTRIUM) 50 MG capsule Take 1 capsule (50 mg total) by mouth 4 (four) times daily. 1 tab bid and 2 tab at qhs 04/24/14   Meredith Staggers, MD  diazepam (VALIUM) 2 MG tablet Take 0.5-1 tablets (1-2 mg total) by mouth at bedtime as needed for anxiety. 04/24/14   Meredith Staggers, MD  divalproex (DEPAKOTE SPRINKLE) 125 MG capsule Take 2 capsules in morning, 5 capsules at bedtime 12/11/13   Cameron Sprang, MD  Feeding Tubes - Bags (FLEXIFLO  FEEDING BAG/PUMP) MISC 1 Units by Does not apply route continuous. 11/27/13   Nita Sells, MD  Feeding Tubes - Pump MISC 1 Units by Does not apply route continuous. 05/17/14   Leeanne Rio, PA-C  Feeding Tubes - Sets (KANGAROO EPUMP SET 1000ML) MISC 1 Units by Does not apply route continuous. 11/27/13   Nita Sells, MD  fluconazole (DIFLUCAN) 40 MG/ML suspension 5 ml per GT daily x 3 dasy then 5 ml weekly x 3 weeks then weekly as needed. 12/01/13   [name], MD  LORazepam (ATIVAN) 1 MG tablet Take 1 tab po tid prn for anxiety 04/10/14   [name], MD  Nutritional Supplements (FEEDING SUPPLEMENT, OSMOLITE 1.2 CAL,) LIQD Place 1,000 mLs into feeding tube daily. 11/27/13   Nita Sells, MD  nystatin cream (MYCOSTATIN) Apply 1 application topically 2 (two) times daily as needed for dry skin. 12/01/13   [name], MD  OLANZapine (ZYPREXA) 5 MG tablet TAKE 1 TABLET BY MOUTH AT BEDTIME. 05/09/14   [name], MD  omeprazole (PRILOSEC) 40 MG capsule Take 40 mg by mouth 2 (two) times daily.    Historical Provider, MD  PARoxetine (PAXIL) 30 MG tablet TAKE 1 TABLET BY MOUTH AT BEDTIME. 04/10/14   [name], MD  Probiotic Product (ADVANCED PROBIOTIC 10) CAPS Give 1 capsule by tube daily.     Historical Provider, MD  sucralfate (CARAFATE) 1 G tablet 1 tablet twice daily via PEG 01/29/14   [name], MD   BP 124/75  Pulse 103  Resp 24  Wt 95 lb (43.092 kg)  SpO2 95% Physical Exam  Nursing note and vitals reviewed. Constitutional: No distress.  Contracted arms  HENT:  Head: Normocephalic and atraumatic.  Right Ear: External ear normal.  Left Ear: External ear normal.  Nose: Nose normal.  Dry lips. Does not open mouth on command for further oropharynx evaluation  Eyes: Right eye exhibits no discharge. Left eye exhibits no discharge.  Neck: Neck supple.  Cardiovascular: Regular rhythm, normal heart sounds and intact distal pulses.  Tachycardia present.    Pulmonary/Chest: Effort normal and breath sounds normal. He has no wheezes.  Abdominal: Soft. He exhibits no distension.  Musculoskeletal: He exhibits no edema.  Neurological: He is alert.  Skin: Skin is warm and dry.    ED Course  Procedures (including critical care time) Labs Review Labs Reviewed  CBC WITH DIFFERENTIAL - Abnormal; Notable for the following:    Neutrophils Relative % 88 (*)    Lymphocytes Relative 11 (*)    Monocytes Relative 1 (*)    Monocytes Absolute 0.0 (*)    All other components within normal limits  COMPREHENSIVE METABOLIC PANEL - Abnormal; Notable for the following:  Glucose, Bld 131 (*)    Creatinine, Ser 0.40 (*)    Anion gap 16 (*)    All other components within normal limits  I-STAT CG4 LACTIC ACID, ED    Imaging Review Dg Chest Port 1 View  05/17/2014   CLINICAL DATA:  Shortness of breath and cough for 1 week.  EXAM: PORTABLE CHEST - 1 VIEW  COMPARISON:  11/29/2013 and 11/17/2013  FINDINGS: Spinal stabilization hardware over the thoracolumbar spine is intact and unchanged. Catheter segment over the upper thorax right of midline unchanged. Lungs are adequately inflated with no focal consolidation or effusion. Cardiomediastinal silhouette and remainder of the exam is unchanged.  IMPRESSION: No active disease.   Electronically Signed   By: Marin Olp M.D.   On: 05/17/2014 18:56     EKG Interpretation None      MDM   Final diagnoses:  Cough    Patient is well-appearing in at his normal baseline. No hypoxia noted. He did occasionally have dips in his oxygen level but this is correlated with poor waveform. He has not appeared in any distress. His chest x-ray and lung exam is un-concerning. Given that family states she's been wheezing and albuterol treatment was tried but there has been no change in any of his exam. His laboratory is also unremarkable. He likely had a respiratory infection earlier when he is having a cough and fever at the  beginning of this is likely continuing to have cough. At this point I feel he is stable for discharge and family agrees and will followup with PCP.    Ephraim Hamburger, MD 05/17/14 [phone]

## 2014-05-17 NOTE — ED Notes (Signed)
Aware of Pt. In room 3.  Resp. Therapist has seen the Pt. With reports to RN of no distress noted.

## 2014-05-17 NOTE — Assessment & Plan Note (Signed)
No tolerating PO fluids well due to cough.  Mother unable to give adequate tube feedings.  Triaged to ER for hydration and evaluation.

## 2014-05-17 NOTE — Progress Notes (Signed)
Pre visit review using our clinic review tool, if applicable. No additional management support is needed unless otherwise documented below in the visit note. 

## 2014-05-17 NOTE — ED Notes (Signed)
Pt. Throwing a temper tantrum in room because he wants to stay in hospital and wants to be admitted.

## 2014-05-17 NOTE — ED Notes (Signed)
SOB and cough for a week. Symptoms worse over the past 2 days.

## 2014-05-17 NOTE — Addendum Note (Signed)
Addended by: Raiford Noble on: 05/17/2014 01:29 PM   Modules accepted: Orders

## 2014-05-22 ENCOUNTER — Telehealth: Payer: Self-pay | Admitting: Pulmonary Disease

## 2014-05-22 ENCOUNTER — Other Ambulatory Visit: Payer: Self-pay | Admitting: Pulmonary Disease

## 2014-05-22 NOTE — Telephone Encounter (Signed)
Called spoke w/ pt mother. Reports pt has had URI x 2 weeks. Has seen PCP twice and been to ED. She scheduled pt an appt to see RA tomorrow for acute visit. Nothing further needed

## 2014-05-23 ENCOUNTER — Ambulatory Visit (INDEPENDENT_AMBULATORY_CARE_PROVIDER_SITE_OTHER): Payer: Medicare Other | Admitting: Pulmonary Disease

## 2014-05-23 ENCOUNTER — Encounter: Payer: Self-pay | Admitting: Pulmonary Disease

## 2014-05-23 VITALS — BP 122/84 | HR 112 | Temp 98.1°F | Ht 62.0 in | Wt 95.0 lb

## 2014-05-23 DIAGNOSIS — E43 Unspecified severe protein-calorie malnutrition: Secondary | ICD-10-CM

## 2014-05-23 DIAGNOSIS — J209 Acute bronchitis, unspecified: Secondary | ICD-10-CM

## 2014-05-23 MED ORDER — PREDNISONE 5 MG/5ML PO SOLN: ORAL | Status: DC

## 2014-05-23 NOTE — Patient Instructions (Signed)
Prednisone 20 mg elixir  Take 20 mg daily with food x 5ds, then 10 mg daily with food x 5ds then STOP Duonebs twice daily & as needed Take mucinex daytime & ok to take hydrocodone at night Nutrition consult

## 2014-05-23 NOTE — Progress Notes (Signed)
   Subjective:    Patient ID: [name], male    DOB: [birth year], 28 y.o.   MRN: [id number]  HPI  28year old male.- accompanied by mother . He has cerebral palsy and needs assistance for all activities of daily living, baseline wheelchair-bound. Underwent  T9-C6 spinal fusion at [facility] in Sept 2011.  He had a PEG placed in 2012 for hydration.  He was hospitalized in 08/2013 for aspiration pneumonia,Swallow evaluation showed severe dysphagia with high risk of aspirating. He has 4 caregivers at home.  Apparently there have been some issues with getting him a feeding pump at home, and he is  maintained on a dysphagia 3 diet.   05/23/2014  Chief Complaint  Patient presents with  . Acute Visit    Had upper respiratory infection last week and was treated with round of antibiotics and reports still having cough   Last Acute OV  Was in 01/2014  Accompanied by mother  Complains increase in fatigue. Pts mother stated he started with a URI, green sputum, was given antibiotics with some improvement but cough has persisted.  Cant get mucus to come up , rattles because he cant cough up. No fever, edema , vomiting.  Cough disturbs his sleep and keeps him up at night, hydrocodone cough syrup has been tried without relief Remains on D3 diet w/ some bolus feeds via peg. However he continues to lose weight, is down to 95 pounds Chest x-ray on 7/16 did not show acute infiltrates She wonders if he needs mechanical vest for secretion clearance  Review of Systems neg for any significant sore throat, dysphagia, itching, sneezing, nasal congestion or excess/ purulent secretions, fever, chills, sweats, unintended wt loss, pleuritic or exertional cp, hempoptysis, orthopnea pnd or change in chronic leg swelling. Also denies presyncope, palpitations, heartburn, abdominal pain, nausea, vomiting, diarrhea or change in bowel or urinary habits, dysuria,hematuria, rash, arthralgias, visual complaints,  headache, numbness weakness or ataxia.      Objective:   Physical Exam  Gen. Pleasant, thin, appears malnourished, in no distress, in wheelchair ENT - no lesions, no post nasal drip Neck: No JVD, no thyromegaly, no carotid bruits Lungs: no use of accessory muscles, no dullness to percussion, clear without rales or rhonchi  Cardiovascular: Rhythm regular, heart sounds  normal, no murmurs or gallops, no peripheral edema Musculoskeletal: No deformities, no cyanosis or clubbing        Assessment & Plan:

## 2014-05-24 NOTE — Assessment & Plan Note (Signed)
Nutrition consult given his weight loss. He may benefit from a feeding pump

## 2014-05-24 NOTE — Assessment & Plan Note (Signed)
Wonder if this is related to aspiration, but history of recent URI obtained Prednisone 20 mg elixir  Take 20 mg daily with food x 5ds, then 10 mg daily with food x 5ds then STOP Duonebs twice daily & as needed Take mucinex daytime & ok to take hydrocodone at night

## 2014-05-25 ENCOUNTER — Telehealth: Payer: Self-pay | Admitting: Pulmonary Disease

## 2014-05-25 NOTE — Telephone Encounter (Signed)
Called and spoke to pt's mother, [name]. [name] stated there is a small bloody tinged drainage around [name]'s PEG tube. [name] also stated he has a temp of 99.4 and is complaining of being hot and needing the fan on him. [name] stated there is a slight increase in mucous production and still with the persistent cough. Pt denies SOB, CP, swelling, hemoptysis. Pt taking all prescribed medication as directed. Pt was seen by RA on 05/23/14 as an acute visit.  RA please advise.   Allergies  Allergen Reactions  . Ambien [Zolpidem Tartrate] Nausea Only  . Codeine   . Baclofen Anxiety  . Diazepam Anxiety  . Sulfonamide Derivatives Rash

## 2014-05-25 NOTE — Telephone Encounter (Signed)
Pt mom aware of recs. She actually had levaquin at home that was prescribed for him a few months ago but he was admitted to the hospital so it was never used. I verified the strength and directions and it is what RA advised. She will start med now. [place] Bing, CMA

## 2014-05-25 NOTE — Telephone Encounter (Signed)
[id number] to advise pt mom. Thur[place] Bing, CMA

## 2014-05-25 NOTE — Telephone Encounter (Signed)
levaquin 500 suspension daily x 7days Hydrate via PEG tube  - 300 cc water every 4h To ER if worse

## 2014-05-26 ENCOUNTER — Emergency Department (HOSPITAL_BASED_OUTPATIENT_CLINIC_OR_DEPARTMENT_OTHER): Payer: Medicare Other

## 2014-05-26 ENCOUNTER — Encounter (HOSPITAL_BASED_OUTPATIENT_CLINIC_OR_DEPARTMENT_OTHER): Payer: Self-pay | Admitting: Emergency Medicine

## 2014-05-26 ENCOUNTER — Emergency Department (HOSPITAL_BASED_OUTPATIENT_CLINIC_OR_DEPARTMENT_OTHER)
Admission: EM | Admit: 2014-05-26 | Discharge: 2014-05-26 | Disposition: A | Discharge status: Home / Self Care | Payer: Medicare Other | Attending: Emergency Medicine | Admitting: Emergency Medicine

## 2014-05-26 DIAGNOSIS — Z8669 Personal history of other diseases of the nervous system and sense organs: Secondary | ICD-10-CM | POA: Diagnosis not present

## 2014-05-26 DIAGNOSIS — IMO0002 Reserved for concepts with insufficient information to code with codable children: Secondary | ICD-10-CM | POA: Diagnosis not present

## 2014-05-26 DIAGNOSIS — F329 Major depressive disorder, single episode, unspecified: Secondary | ICD-10-CM | POA: Insufficient documentation

## 2014-05-26 DIAGNOSIS — F3289 Other specified depressive episodes: Secondary | ICD-10-CM | POA: Diagnosis not present

## 2014-05-26 DIAGNOSIS — Z8639 Personal history of other endocrine, nutritional and metabolic disease: Secondary | ICD-10-CM | POA: Insufficient documentation

## 2014-05-26 DIAGNOSIS — J069 Acute upper respiratory infection, unspecified: Secondary | ICD-10-CM | POA: Diagnosis not present

## 2014-05-26 DIAGNOSIS — R0989 Other specified symptoms and signs involving the circulatory and respiratory systems: Secondary | ICD-10-CM | POA: Diagnosis not present

## 2014-05-26 DIAGNOSIS — F411 Generalized anxiety disorder: Secondary | ICD-10-CM | POA: Diagnosis not present

## 2014-05-26 DIAGNOSIS — K219 Gastro-esophageal reflux disease without esophagitis: Secondary | ICD-10-CM | POA: Diagnosis not present

## 2014-05-26 DIAGNOSIS — Z79899 Other long term (current) drug therapy: Secondary | ICD-10-CM | POA: Diagnosis not present

## 2014-05-26 DIAGNOSIS — Z862 Personal history of diseases of the blood and blood-forming organs and certain disorders involving the immune mechanism: Secondary | ICD-10-CM | POA: Diagnosis not present

## 2014-05-26 MED ORDER — SODIUM CHLORIDE 0.9 % IV BOLUS (SEPSIS)
1000.0000 mL | Freq: Once | INTRAVENOUS | Status: AC
  Administered 2014-05-26: 1000 mL via INTRAVENOUS

## 2014-05-26 MED ORDER — LORAZEPAM 2 MG/ML IJ SOLN
0.5000 mg | Freq: Once | INTRAMUSCULAR | Status: AC
  Administered 2014-05-26: 0.5 mg via INTRAVENOUS
  Filled 2014-05-26: qty 1

## 2014-05-26 NOTE — ED Provider Notes (Signed)
Medical screening examination/treatment/procedure(s) were conducted as a shared visit with non-physician practitioner(s) and myself.  I personally evaluated the patient during the encounter.   EKG Interpretation None       Orlie Dakin, MD 05/26/14 [phone]

## 2014-05-26 NOTE — ED Provider Notes (Signed)
CSN: [id number]     Arrival date & time 05/26/14  1127 History   First MD Initiated Contact with Patient 05/26/14 1229     Chief Complaint  Patient presents with  . URI     (Consider location/radiation/quality/duration/timing/severity/associated sxs/prior Treatment) Patient is a 28 y.o. male presenting with URI. The history is provided by a parent. No language interpreter was used.  URI Presenting symptoms: congestion, cough, rhinorrhea and sore throat   Presenting symptoms: no fever   Associated symptoms comment:  Per mom, the patient who has cerebral palsy has been sick with symptoms of nasal congestion, chest congestion and cough with thick secretions for the past one month. He has been treated with antibiotics, cough medication, ativan and, currently, with steroids. Mom reports he is not sleeping at night with coughing and is having thick secretions that are difficult to expectorate.   Past Medical History  Diagnosis Date  . Cerebral palsy   . GERD (gastroesophageal reflux disease)   . Anxiety   . Depression   . Thyroid disease     hyper  . Incontinence of feces   . Palpitations   . Esophagitis   . Dehydration 11/22/2013   Past Surgical History  Procedure Laterality Date  . Spine surgery  ,11/20/2010, 2011  . Eye surgery    . Ears tubes    . Hamstring released    . Baclofen trial    . Baslofen pump implant    . Spinal fusion    . G-tube insert  August 2006  . Spinal fusioncorrect 106 degree kyphosis    . Spinal fusion to correct 70 degree kyphosis    . Tonsillectomy    . Peg placement  10/21/2011    Procedure: PERCUTANEOUS ENDOSCOPIC GASTROSTOMY (PEG) REPLACEMENT;  Surgeon: [name], MD;  Location: [facility];  Service: Endoscopy;  Laterality: N/A;  . Peg placement N/A 06/13/2013    Procedure: PERCUTANEOUS ENDOSCOPIC GASTROSTOMY (PEG) REPLACEMENT;  Surgeon: [name], MD;  Location: [facility];  Service: Endoscopy;  Laterality: N/A;   Family History   Problem Relation Age of Onset  . Asthma Mother   . Hyperlipidemia Mother   . COPD Mother   . Other Mother     bronchial stasis/ABPA  . Cancer Maternal Grandmother 20    breast  . Hyperlipidemia Maternal Grandmother   . Hypertension Maternal Grandmother   . Cancer Maternal Grandfather     prostate  . Heart disease Paternal Grandfather     CHF  . Osteoporosis Paternal Grandmother   . Arthritis Paternal Grandmother     rheumatoid   History  Substance Use Topics  . Smoking status: Never Smoker   . Smokeless tobacco: Never Used  . Alcohol Use: No    Review of Systems  Constitutional: Negative for fever and chills.  HENT: Positive for congestion, rhinorrhea and sore throat.   Respiratory: Positive for cough. Negative for shortness of breath.   Cardiovascular: Negative for chest pain.  Gastrointestinal: Negative.  Negative for nausea and vomiting.  Musculoskeletal: Negative.   Skin: Negative.       Allergies  Ambien; Codeine; Baclofen; Diazepam; and Sulfonamide derivatives  Home Medications   Prior to Admission medications   Medication Sig Start Date End Date Taking? Authorizing Provider  AMBULATORY NON FORMULARY MEDICATION Medication Name: MIC gastrostomy/bolus feeding tube 24 French Part number 0110-24. #2 and 10 cc lurer lock syringe #2 Dx: 11/27/13   Nita Sells, MD  chlorpheniramine-HYDROcodone (TUSSIONEX PENNKINETIC ER) 10-8 MG/5ML  LQCR Take 5 mLs by mouth every 12 (twelve) hours as needed. 05/17/14   Leeanne Rio, PA-C  clotrimazole-betamethasone (LOTRISONE) cream Apply 1 application topically 2 (two) times daily. 03/27/14   [name], MD  dantrolene (DANTRIUM) 50 MG capsule Take 1 capsule (50 mg total) by mouth 4 (four) times daily. 1 tab bid and 2 tab at qhs 04/24/14   Meredith Staggers, MD  diazepam (VALIUM) 2 MG tablet Take 0.5-1 tablets (1-2 mg total) by mouth at bedtime as needed for anxiety. 04/24/14   Meredith Staggers, MD  divalproex  (DEPAKOTE SPRINKLE) 125 MG capsule Take 2 capsules in morning, 5 capsules at bedtime 12/11/13   Cameron Sprang, MD  Feeding Tubes - Bags (FLEXIFLO FEEDING BAG/PUMP) MISC 1 Units by Does not apply route continuous. 11/27/13   Nita Sells, MD  Feeding Tubes - Pump MISC 1 Units by Does not apply route continuous. 05/17/14   Leeanne Rio, PA-C  Feeding Tubes - Sets (KANGAROO EPUMP SET 1000ML) MISC 1 Units by Does not apply route continuous. 11/27/13   Nita Sells, MD  fluconazole (DIFLUCAN) 40 MG/ML suspension 5 ml per GT daily x 3 dasy then 5 ml weekly x 3 weeks then weekly as needed. 12/01/13   [name], MD  LORazepam (ATIVAN) 1 MG tablet Take 1 tab po tid prn for anxiety 04/10/14   [name], MD  Nutritional Supplements (FEEDING SUPPLEMENT, OSMOLITE 1.2 CAL,) LIQD Place 1,000 mLs into feeding tube daily. 11/27/13   Nita Sells, MD  nystatin cream (MYCOSTATIN) Apply 1 application topically 2 (two) times daily as needed for dry skin. 12/01/13   [name], MD  OLANZapine (ZYPREXA) 5 MG tablet TAKE 1 TABLET BY MOUTH AT BEDTIME. 05/09/14   [name], MD  omeprazole (PRILOSEC) 40 MG capsule Take 40 mg by mouth 2 (two) times daily.    Historical Provider, MD  PARoxetine (PAXIL) 30 MG tablet TAKE 1 TABLET BY MOUTH AT BEDTIME. 04/10/14   [name], MD  predniSONE 5 MG/5ML solution Take 20mg  with food x5 days, then 10mg  with food x5 days then STOP. TAKE VIA TUBE 05/23/14   Rigoberto Noel, MD  Probiotic Product (ADVANCED PROBIOTIC 10) CAPS Give 1 capsule by tube daily.     Historical Provider, MD  sucralfate (CARAFATE) 1 G tablet 1 tablet twice daily via PEG 01/29/14   [name], MD   BP 116/85  Pulse 107  Temp(Src) 98.3 F (36.8 C) (Axillary)  Resp 22  Wt 95 lb (43.092 kg)  SpO2 94% Physical Exam  Constitutional: He appears well-developed and well-nourished.  HENT:  Head: Normocephalic.  Neck: Normal range of motion. Neck supple.  Cardiovascular: Normal  rate and regular rhythm.   Pulmonary/Chest: Effort normal and breath sounds normal. He has no wheezes. He has no rales.  Abdominal: Soft. Bowel sounds are normal. There is no tenderness. There is no rebound and no guarding.  Musculoskeletal: Normal range of motion.  UE and LE contractures.  Lymphadenopathy:    He has no cervical adenopathy.  Neurological: He is alert.  Skin: Skin is warm and dry. No rash noted.  Psychiatric: He has a normal mood and affect.    ED Course  Procedures (including critical care time) Labs Review Labs Reviewed - No data to display  Imaging Review Dg Chest Portable 1 View  05/26/2014   CLINICAL DATA:  Upper respiratory symptoms, cerebral palsy, prior spinal fusion  EXAM: PORTABLE  CHEST - 1 VIEW  COMPARISON:  05/17/2014  FINDINGS: Slight rotation to the right. Fusion hardware noted of the entire visualized spine. Normal heart size and vascularity. No focal pneumonia, collapse or consolidation. No edema, effusion or pneumothorax. Stable catheter/tubing over the spine, suspect prior pump implant tubing.  IMPRESSION: Stable chest exam.  No new process.   Electronically Signed   By: Daryll Brod M.D.   On: 05/26/2014 14:02     EKG Interpretation None      MDM   Final diagnoses:  None    1. URI  Patient seen by Dr. Winfred Leeds who recommends discontinuation of steroids as it may be causing agitation and restlessness. Continue supportive care and PCp follow up.    Dewaine Oats, PA-C 05/26/14 1603

## 2014-05-26 NOTE — ED Provider Notes (Addendum)
Pt with history of cerebral palsy. Spent last night unable to sleep. Mother reports has increased since from his mouth.. On exam patient in no respiratory distress. Place on prednisone this week ago by Dr. Elsworth Soho. Suggest discontinue prednisone in light of patient's agitation. Chest xray viewed by me  Orlie Dakin, MD 05/26/14 1607  Orlie Dakin, MD 05/26/14 [place], MD 05/26/14 1245

## 2014-05-26 NOTE — ED Notes (Signed)
Pt has hx of CP and has been sick x several weeks with a URI.  pts mother reports that pt has been anxious over the past several nights and has not slept due to prednisone.  Pt was noted to be coughing-but is unable to clear airway.  Pt suctioned, thick white mucus present.

## 2014-05-26 NOTE — Discharge Instructions (Signed)
USE THE TUSSIONEX FOR COUGH. FOLLOW UP WITH YOUR DOCTOR IN 2-3 DAYS FOR RECHECK. RETURN HERE WITH ANY WORSENING SYMPTOMS.    Upper Respiratory Infection, Adult An upper respiratory infection (URI) is also sometimes known as the common cold. The upper respiratory tract includes the nose, sinuses, throat, trachea, and bronchi. Bronchi are the airways leading to the lungs. Most people improve within 1 week, but symptoms can last up to 2 weeks. A residual cough may last even longer.  CAUSES Many different viruses can infect the tissues lining the upper respiratory tract. The tissues become irritated and inflamed and often become very moist. Mucus production is also common. A cold is contagious. You can easily spread the virus to others by oral contact. This includes kissing, sharing a glass, coughing, or sneezing. Touching your mouth or nose and then touching a surface, which is then touched by another person, can also spread the virus. SYMPTOMS  Symptoms typically develop 1 to 3 days after you come in contact with a cold virus. Symptoms vary from person to person. They may include:  Runny nose.  Sneezing.  Nasal congestion.  Sinus irritation.  Sore throat.  Loss of voice (laryngitis).  Cough.  Fatigue.  Muscle aches.  Loss of appetite.  Headache.  Low-grade fever. DIAGNOSIS  You might diagnose your own cold based on familiar symptoms, since most people get a cold 2 to 3 times a year. Your caregiver can confirm this based on your exam. Most importantly, your caregiver can check that your symptoms are not due to another disease such as strep throat, sinusitis, pneumonia, asthma, or epiglottitis. Blood tests, throat tests, and X-rays are not necessary to diagnose a common cold, but they may sometimes be helpful in excluding other more serious diseases. Your caregiver will decide if any further tests are required. RISKS AND COMPLICATIONS  You may be at risk for a more severe case of the  common cold if you smoke cigarettes, have chronic heart disease (such as heart failure) or lung disease (such as asthma), or if you have a weakened immune system. The very young and very old are also at risk for more serious infections. Bacterial sinusitis, middle ear infections, and bacterial pneumonia can complicate the common cold. The common cold can worsen asthma and chronic obstructive pulmonary disease (COPD). Sometimes, these complications can require emergency medical care and may be life-threatening. PREVENTION  The best way to protect against getting a cold is to practice good hygiene. Avoid oral or hand contact with people with cold symptoms. Wash your hands often if contact occurs. There is no clear evidence that vitamin C, vitamin E, echinacea, or exercise reduces the chance of developing a cold. However, it is always recommended to get plenty of rest and practice good nutrition. TREATMENT  Treatment is directed at relieving symptoms. There is no cure. Antibiotics are not effective, because the infection is caused by a virus, not by bacteria. Treatment may include:  Increased fluid intake. Sports drinks offer valuable electrolytes, sugars, and fluids.  Breathing heated mist or steam (vaporizer or shower).  Eating chicken soup or other clear broths, and maintaining good nutrition.  Getting plenty of rest.  Using gargles or lozenges for comfort.  Controlling fevers with ibuprofen or acetaminophen as directed by your caregiver.  Increasing usage of your inhaler if you have asthma. Zinc gel and zinc lozenges, taken in the first 24 hours of the common cold, can shorten the duration and lessen the severity of symptoms. Pain medicines  may help with fever, muscle aches, and throat pain. A variety of non-prescription medicines are available to treat congestion and runny nose. Your caregiver can make recommendations and may suggest nasal or lung inhalers for other symptoms.  HOME CARE  INSTRUCTIONS   Only take over-the-counter or prescription medicines for pain, discomfort, or fever as directed by your caregiver.  Use a warm mist humidifier or inhale steam from a shower to increase air moisture. This may keep secretions moist and make it easier to breathe.  Drink enough water and fluids to keep your urine clear or pale yellow.  Rest as needed.  Return to work when your temperature has returned to normal or as your caregiver advises. You may need to stay home longer to avoid infecting others. You can also use a face mask and careful hand washing to prevent spread of the virus. SEEK MEDICAL CARE IF:   After the first few days, you feel you are getting worse rather than better.  You need your caregiver's advice about medicines to control symptoms.  You develop chills, worsening shortness of breath, or brown or red sputum. These may be signs of pneumonia.  You develop yellow or brown nasal discharge or pain in the face, especially when you bend forward. These may be signs of sinusitis.  You develop a fever, swollen neck glands, pain with swallowing, or white areas in the back of your throat. These may be signs of strep throat. SEEK IMMEDIATE MEDICAL CARE IF:   You have a fever.  You develop severe or persistent headache, ear pain, sinus pain, or chest pain.  You develop wheezing, a prolonged cough, cough up blood, or have a change in your usual mucus (if you have chronic lung disease).  You develop sore muscles or a stiff neck. Document Released: 04/14/2001 Document Revised: 01/11/2012 Document Reviewed: 01/24/2014 [facility] Patient Information 2015 [place], [place]. This information is not intended to replace advice given to you by your health care provider. Make sure you discuss any questions you have with your health care provider.

## 2014-05-26 NOTE — ED Notes (Signed)
Mother sts pt has had URI for the past week and has been taking prednisone. Pt has been seen by his pcp. She sts that pt has been unable to sleep more than just a couple of hours a night. She also believes he may be dehydrated.

## 2014-05-28 ENCOUNTER — Encounter: Payer: Self-pay | Admitting: Family Medicine

## 2014-05-28 ENCOUNTER — Ambulatory Visit (INDEPENDENT_AMBULATORY_CARE_PROVIDER_SITE_OTHER): Payer: Medicare Other | Admitting: Family Medicine

## 2014-05-28 ENCOUNTER — Encounter: Payer: Self-pay | Admitting: Pulmonary Disease

## 2014-05-28 ENCOUNTER — Telehealth: Payer: Self-pay | Admitting: *Deleted

## 2014-05-28 VITALS — BP 128/74 | HR 125 | Ht 62.0 in

## 2014-05-28 DIAGNOSIS — E860 Dehydration: Secondary | ICD-10-CM | POA: Diagnosis not present

## 2014-05-28 DIAGNOSIS — R002 Palpitations: Secondary | ICD-10-CM

## 2014-05-28 DIAGNOSIS — F411 Generalized anxiety disorder: Secondary | ICD-10-CM | POA: Diagnosis not present

## 2014-05-28 DIAGNOSIS — R633 Feeding difficulties, unspecified: Secondary | ICD-10-CM

## 2014-05-28 DIAGNOSIS — R638 Other symptoms and signs concerning food and fluid intake: Secondary | ICD-10-CM

## 2014-05-28 DIAGNOSIS — J209 Acute bronchitis, unspecified: Secondary | ICD-10-CM

## 2014-05-28 MED ORDER — KANGAROO EPUMP SET 1000ML MISC: Status: DC

## 2014-05-28 MED ORDER — OSMOLITE 1.2 CAL PO LIQD: 1000.0000 mL | ORAL | Status: DC

## 2014-05-28 MED ORDER — FLEXIFLO FEEDING BAG/PUMP MISC: 1.0000 [IU] | Status: DC

## 2014-05-28 MED ORDER — FEEDING TUBES - PUMP MISC: 1.0000 [IU] | Status: DC

## 2014-05-28 MED ORDER — METOPROLOL TARTRATE 25 MG PO TABS: 25.0000 mg | ORAL_TABLET | Freq: Two times a day (BID) | ORAL | Status: DC

## 2014-05-28 NOTE — Telephone Encounter (Signed)
Per MD we can open at 4:15 slot this afternoon to see pt.   Patients mother aware and states she will bring him then

## 2014-05-28 NOTE — Progress Notes (Signed)
Pre visit review using our clinic review tool, if applicable. No additional management support is needed unless otherwise documented below in the visit note. 

## 2014-05-28 NOTE — Telephone Encounter (Signed)
Please see other notes and mychart messages.  Pts mother stating heart rate 125 with palpitations. In message on my phone pts mother stated heart rate was 112

## 2014-05-28 NOTE — Patient Instructions (Signed)
[name]'s [facility] daily Robitussin/Guaifensin liquid 400 to 800 mg twice daily   Nonspecific Tachycardia Tachycardia is a faster than normal heartbeat (more than 100 beats per minute). In adults, the heart normally beats between 60 and 100 times a minute. A fast heartbeat may be a normal response to exercise or stress. It does not necessarily mean that something is wrong. However, sometimes when your heart beats too fast it may not be able to pump enough blood to the rest of your body. This can result in chest pain, shortness of breath, dizziness, and even fainting. Nonspecific tachycardia means that the specific cause or pattern of your tachycardia is unknown. CAUSES  Tachycardia may be harmless or it may be due to a more serious underlying cause. Possible causes of tachycardia include:  Exercise or exertion.  Fever.  Pain or injury.  Infection.  Loss of body fluids (dehydration).  Overactive thyroid.  Lack of red blood cells (anemia).  Anxiety and stress.  Alcohol.  Caffeine.  Tobacco products.  Diet pills.  Illegal drugs.  Heart disease. SYMPTOMS  Rapid or irregular heartbeat (palpitations).  Suddenly feeling your heart beating (cardiac awareness).  Dizziness.  Tiredness (fatigue).  Shortness of breath.  Chest pain.  Nausea.  Fainting. DIAGNOSIS  Your caregiver will perform a physical exam and take your medical history. In some cases, a heart specialist (cardiologist) may be consulted. Your caregiver may also order:  Blood tests.  Electrocardiography. This test records the electrical activity of your heart.  A heart monitoring test. TREATMENT  Treatment will depend on the likely cause of your tachycardia. The goal is to treat the underlying cause of your tachycardia. Treatment methods may include:  Replacement of fluids or blood through an intravenous (IV) tube for moderate to severe dehydration or anemia.  New medicines or changes in your  current medicines.  Diet and lifestyle changes.  Treatment for certain infections.  Stress relief or relaxation methods. HOME CARE INSTRUCTIONS   Rest.  Drink enough fluids to keep your urine clear or pale yellow.  Do not smoke.  Avoid:  Caffeine.  Tobacco.  Alcohol.  Chocolate.  Stimulants such as over-the-counter diet pills or pills that help you stay awake.  Situations that cause anxiety or stress.  Illegal drugs such as marijuana, phencyclidine (PCP), and cocaine.  Only take medicine as directed by your caregiver.  Keep all follow-up appointments as directed by your caregiver. SEEK IMMEDIATE MEDICAL CARE IF:   You have pain in your chest, upper arms, jaw, or neck.  You become weak, dizzy, or feel faint.  You have palpitations that will not go away.  You vomit, have diarrhea, or pass blood in your stool.  Your skin is cool, pale, and wet.  You have a fever that will not go away with rest, fluids, and medicine. MAKE SURE YOU:   Understand these instructions.  Will watch your condition.  Will get help right away if you are not doing well or get worse. Document Released: 11/26/2004 Document Revised: 01/11/2012 Document Reviewed: 09/29/2011 [facility] Patient Information 2015 [place], [place]. This information is not intended to replace advice given to you by your health care provider. Make sure you discuss any questions you have with your health care provider.

## 2014-05-28 NOTE — Telephone Encounter (Signed)
FYI:  See 05/26/14 ER note:  [facility] Triage Call Report Triage Record Num: [id number] Operator: Amy Maggart Patient Name: [name] Call Date & Time: 05/26/2014 9:59:39AM Patient Phone: [phone] PCP: Gwyneth Revels Patient Gender: Male PCP Fax : [phone] Patient DOB: 01/10/1986 [facility] Name: [facility] [place] Reason for Call: Caller: Linda/Mother; PCP: [name] (Family Practice); CB#: [phone]; Call regarding Cough/Congestion; Onset 05/09/14. Afebrile. Mom reports URI x 17 days, was seen again in office 05/23/14, taking Prednisone and Levaquin as instructed, but no improvement is sx. Mom wants seen again today. Triaged per Breathing Problems guideline, disp. to See Provider in 8hrs for "being treated for secondary infection and no improvement in symptoms." No appts available at [facility] office, instructed mom to take to [place] for evaluation/treatment; mom verbalized understanding. Protocol(s) Used: Breathing Problems Recommended Outcome per Protocol: Call Provider within 8 Hours Reason for Outcome: Being treated by a provider for a secondary infection AND no improvement in symptoms, symptoms have worsened OR has new symptoms after following treatment plan for the time specified by provider.

## 2014-05-28 NOTE — Telephone Encounter (Signed)
Patients mom left a message along with a mychart message to Dr Charlett Blake and to Dr Elsworth Soho.  Pts mom stated that Dr Charlett Blake has no appts open nor does Marvene Staff have any appts open. Patients mom was given an oppurtunity to see Nicky Pugh at [facility] and pts mother stated she wanted to speak to Dr Frederik Pear nurse (thats when message was left per [place]).  pts mom states she doesn't want to go to ER because they won't do anything? pts mom states pts heart rate is 112?  Pts mother wants to know why he is dehydrated when fluids are being pushed? Pt doesn't sleep at night? Is anxiety is out of control?  Please advise?

## 2014-05-29 ENCOUNTER — Encounter: Payer: Self-pay | Admitting: Family Medicine

## 2014-05-29 ENCOUNTER — Telehealth: Payer: Self-pay

## 2014-05-29 DIAGNOSIS — F39 Unspecified mood [affective] disorder: Secondary | ICD-10-CM | POA: Diagnosis not present

## 2014-05-29 NOTE — Assessment & Plan Note (Signed)
Has been entirely on tube feeds for past week and is becoming increasingly sob

## 2014-05-29 NOTE — Assessment & Plan Note (Signed)
Will give orders for home health to go to home and run IVF x 2 days

## 2014-05-29 NOTE — Progress Notes (Signed)
I will place order for IVF I want NS 1000L bag over 4 hours for 2 days

## 2014-05-29 NOTE — Telephone Encounter (Signed)
Ordered please print and fax.

## 2014-05-29 NOTE — Progress Notes (Signed)
Patient ID: [name], male   DOB: Oct 03, 1986, 28 y.o.   MRN: [id number] [name] [id number] 30-Sep-1986 05/29/2014      Progress Note-Follow Up  Subjective  Chief Complaint  Chief Complaint  Patient presents with  . Follow-up    still congested, high pulse rate (over 100-133), anxiety    HPI  Patient is a 28 year old male in today for routine medical care. In [facility] brought in by mother, who is his care provider. Very anxious and loud. Has been sick with respiratory illness for over a week and has stopped eating. He is congested and not sleeping well. Procedure post retired he when he becomes ill he tends to do much worse with anxiety. He's had high heart rate as a result. GI or GU complaints although he has stopped taking fluids by mouth and is only taking pain tube feedings  Past Medical History  Diagnosis Date  . Cerebral palsy   . GERD (gastroesophageal reflux disease)   . Anxiety   . Depression   . Thyroid disease     hyper  . Incontinence of feces   . Palpitations   . Esophagitis   . Dehydration 11/22/2013    Past Surgical History  Procedure Laterality Date  . Spine surgery  ,11/20/2010, 2011  . Eye surgery    . Ears tubes    . Hamstring released    . Baclofen trial    . Baslofen pump implant    . Spinal fusion    . G-tube insert  August 2006  . Spinal fusioncorrect 106 degree kyphosis    . Spinal fusion to correct 70 degree kyphosis    . Tonsillectomy    . Peg placement  10/21/2011    Procedure: PERCUTANEOUS ENDOSCOPIC GASTROSTOMY (PEG) REPLACEMENT;  Surgeon: [name], MD;  Location: [facility];  Service: Endoscopy;  Laterality: N/A;  . Peg placement N/A 06/13/2013    Procedure: PERCUTANEOUS ENDOSCOPIC GASTROSTOMY (PEG) REPLACEMENT;  Surgeon: [name], MD;  Location: [facility];  Service: Endoscopy;  Laterality: N/A;    Family History  Problem Relation Age of Onset  . Asthma Mother   . Hyperlipidemia Mother   . COPD Mother   . Other  Mother     bronchial stasis/ABPA  . Cancer Maternal Grandmother 33    breast  . Hyperlipidemia Maternal Grandmother   . Hypertension Maternal Grandmother   . Cancer Maternal Grandfather     prostate  . Heart disease Paternal Grandfather     CHF  . Osteoporosis Paternal Grandmother   . Arthritis Paternal Grandmother     rheumatoid    History   Social History  . Marital Status: Single    Spouse Name: N/A    Number of Children: 0  . Years of Education: N/A   Occupational History  . disbaled    Social History Main Topics  . Smoking status: Never Smoker   . Smokeless tobacco: Never Used  . Alcohol Use: No  . Drug Use: No  . Sexual Activity: No   Other Topics Concern  . Not on file   Social History Narrative  . No narrative on file    Current Outpatient Prescriptions on File Prior to Visit  Medication Sig Dispense Refill  . AMBULATORY NON FORMULARY MEDICATION Medication Name: MIC gastrostomy/bolus feeding tube 24 French Part number 0110-24. #2 and 10 cc lurer lock syringe #2 Dx:  2 Device  1  . clotrimazole-betamethasone (LOTRISONE) cream Apply 1  application topically 2 (two) times daily.  45 g  1  . dantrolene (DANTRIUM) 50 MG capsule Take 1 capsule (50 mg total) by mouth 4 (four) times daily. 1 tab bid and 2 tab at qhs  120 capsule  4  . diazepam (VALIUM) 2 MG tablet Take 0.5-1 tablets (1-2 mg total) by mouth at bedtime as needed for anxiety.  30 tablet  1  . divalproex (DEPAKOTE SPRINKLE) 125 MG capsule Take 2 capsules in morning, 5 capsules at bedtime  210 capsule  4  . fluconazole (DIFLUCAN) 40 MG/ML suspension 5 ml per GT daily x 3 dasy then 5 ml weekly x 3 weeks then weekly as needed.  100 mL  1  . LORazepam (ATIVAN) 1 MG tablet Take 1 tab po tid prn for anxiety  90 tablet  1  . nystatin cream (MYCOSTATIN) Apply 1 application topically 2 (two) times daily as needed for dry skin.  30 g  1  . OLANZapine (ZYPREXA) 5 MG tablet TAKE 1 TABLET BY MOUTH AT BEDTIME.  30  tablet  3  . omeprazole (PRILOSEC) 40 MG capsule Take 40 mg by mouth 2 (two) times daily.      Marland Kitchen PARoxetine (PAXIL) 30 MG tablet TAKE 1 TABLET BY MOUTH AT BEDTIME.  30 tablet  2  . Probiotic Product (ADVANCED PROBIOTIC 10) CAPS Give 1 capsule by tube daily.       . sucralfate (CARAFATE) 1 G tablet 1 tablet twice daily via PEG  60 tablet  1   No current facility-administered medications on file prior to visit.    Allergies  Allergen Reactions  . Ambien [Zolpidem Tartrate] Nausea Only  . Codeine   . Baclofen Anxiety  . Diazepam Anxiety  . Sulfonamide Derivatives Rash    Review of Systems  Review of Systems  Constitutional: Positive for malaise/fatigue. Negative for fever.  HENT: Positive for congestion.   Eyes: Negative for discharge.  Respiratory: Positive for cough and sputum production. Negative for shortness of breath.   Cardiovascular: Negative for chest pain, palpitations and leg swelling.  Gastrointestinal: Negative for nausea, abdominal pain and diarrhea.  Genitourinary: Negative for dysuria.  Musculoskeletal: Negative for falls.  Skin: Negative for rash.  Neurological: Negative for loss of consciousness and headaches.  Endo/Heme/Allergies: Negative for polydipsia.  Psychiatric/Behavioral: Negative for depression and suicidal ideas. The patient is nervous/anxious and has insomnia.     Objective  BP 128/74  Pulse 125  Ht 5\' 2"  (1.575 m)  Physical Exam  Physical Exam  Constitutional: He is oriented to person, place, and time and well-developed, well-nourished, and in no distress. No distress.  In wheelchair  HENT:  Head: Normocephalic and atraumatic.  Dry mucus membraines  Eyes: Conjunctivae are normal.  Neck: Neck supple. No thyromegaly present.  Cardiovascular: Normal rate, regular rhythm and normal heart sounds.   No murmur heard. Pulmonary/Chest: Effort normal. No respiratory distress. He has wheezes.  scattered  Abdominal: He exhibits no distension and  no mass. There is no tenderness.  PEG tube LUQ  Musculoskeletal: He exhibits no edema.  Neurological: He is alert and oriented to person, place, and time.  Skin: Skin is warm.  Psychiatric: Memory, affect and judgment normal.  Very anxious and calling out.    Lab Results  Component Value Date   TSH 0.519 11/22/2013   Lab Results  Component Value Date   WBC 5.8 05/17/2014   HGB 14.3 05/17/2014   HCT 43.5 05/17/2014   MCV 87.2 05/17/2014  PLT 249 05/17/2014   Lab Results  Component Value Date   CREATININE 0.40* 05/17/2014   BUN 8 05/17/2014   NA 141 05/17/2014   K 4.1 05/17/2014   CL 99 05/17/2014   CO2 26 05/17/2014   Lab Results  Component Value Date   ALT 8 05/17/2014   AST 13 05/17/2014   ALKPHOS 65 05/17/2014   BILITOT 0.3 05/17/2014   Lab Results  Component Value Date   CHOL 151 04/25/2010   Lab Results  Component Value Date   HDL 29.90* 04/25/2010   Lab Results  Component Value Date   LDLCALC 96 04/25/2010   Lab Results  Component Value Date   TRIG 125.0 04/25/2010   Lab Results  Component Value Date   CHOLHDL 5 04/25/2010     Assessment & Plan  Dehydration Will give orders for home health to go to home and run IVF x 2 days  ANXIETY Worse today, mom very loud and jumpy in room.   Generalized anxiety disorder Seeing psychiatry tomorrow. No new meds til soon  PALPITATIONS Sinus tachycardia noted today start on Metoprolol bid and reasses  Decreased oral intake Will need IVF and npo for now. Needs PEG tube feedings  FEEDING PROBLEM Has been entirely on tube feeds for past week and is becoming increasingly sob  Acute bronchitis Improving on levaquin encouraged to add Mucinex, increase rest and hydration

## 2014-05-29 NOTE — Assessment & Plan Note (Signed)
Improving on levaquin encouraged to add Mucinex, increase rest and hydration

## 2014-05-29 NOTE — Progress Notes (Signed)
I placed order please fax

## 2014-05-29 NOTE — Telephone Encounter (Signed)
I spoke with Levada Dy at [facility] and she stated that they can do IV Fluids.  They need office notes and referral of exactly what is being ordered faxed to [phone] Please advise?   FYI:  The [facility] infusion Center that's in epic is incorrect. I spoke to [place] to cancel this but she states they didn't even receive this.  I sent the RX to the correct office at fax # [phone]

## 2014-05-29 NOTE — Assessment & Plan Note (Signed)
Worse today, mom very loud and jumpy in room.

## 2014-05-29 NOTE — Assessment & Plan Note (Signed)
Sinus tachycardia noted today start on Metoprolol bid and reasses

## 2014-05-29 NOTE — Assessment & Plan Note (Signed)
Seeing psychiatry tomorrow. No new meds til soon

## 2014-05-29 NOTE — Assessment & Plan Note (Addendum)
Will need IVF and npo for now. Needs PEG tube feedings

## 2014-05-29 NOTE — Telephone Encounter (Signed)
I spoke with Levada Dy at [facility] and she stated that they can do IV Fluids.  They need office notes and referral of exactly what is being ordered faxed to [phone]  Please advise?    FYI:  The [facility] infusion [facility] that's in epic is incorrect. I spoke to [place] to cancel this but she states they didn't even receive this.  I sent the RX to the correct office at fax # [phone]

## 2014-05-30 NOTE — Telephone Encounter (Signed)
Notified pt's mom. She states she feels they are doing well on pt's hydration. Pt just saw his neuropsychiatrist and they are going to decrease his paroxetine gradually from 30mg  to 20mg  dose to see if this will help improve his hydration as well. She states she will send Dr Charlett Blake an update tonight.

## 2014-05-30 NOTE — Telephone Encounter (Signed)
Per Marj,  [facility] says today that they are not taking any new pts.  [facility] and [facility] will not provide supplies for IV infusion.  Please contact pt's mom and advise her that I have reviewed this with Dr. Charlett Blake who is out of the office and that options at this point include increasing hydration via PEG tube feedings.  If severe dehydration he should be brought to the ED. Dr. Charlett Blake said that if they would like she can try to work on possibility of outpatient infusion at [facility] when she returns to the office tomorrow.

## 2014-06-01 ENCOUNTER — Encounter: Payer: Self-pay | Admitting: Family Medicine

## 2014-06-01 ENCOUNTER — Encounter: Payer: Self-pay | Admitting: Pulmonary Disease

## 2014-06-01 ENCOUNTER — Ambulatory Visit (INDEPENDENT_AMBULATORY_CARE_PROVIDER_SITE_OTHER): Payer: Medicare Other | Admitting: Pulmonary Disease

## 2014-06-01 VITALS — BP 102/64 | HR 88

## 2014-06-01 DIAGNOSIS — J984 Other disorders of lung: Secondary | ICD-10-CM | POA: Diagnosis not present

## 2014-06-01 DIAGNOSIS — M4150 Other secondary scoliosis, site unspecified: Secondary | ICD-10-CM

## 2014-06-01 DIAGNOSIS — J209 Acute bronchitis, unspecified: Secondary | ICD-10-CM | POA: Diagnosis not present

## 2014-06-01 DIAGNOSIS — M419 Scoliosis, unspecified: Secondary | ICD-10-CM

## 2014-06-01 NOTE — Assessment & Plan Note (Signed)
Airway clearance Measures have been tried for his secretions-include suctioning, breathing techniques, some manner percussion as much as possible by caregivers-however it seems that we are not successful in mobilizing secretions. 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  Hence we will go and prescribe  a chest vest for physiotherapy and secretion clearance.

## 2014-06-01 NOTE — Assessment & Plan Note (Signed)
-   with difficulty clearing secretions COmplete course on levaquin - use probiotic We will put in request for  mechanical vest for secretion clearance Mucinex daytime + suctioning as needed OK to use tussionex 2.5 ml qhs for cough suppression Call if worse

## 2014-06-01 NOTE — Patient Instructions (Signed)
COmplete course on levaquin - use probiotic We will put in request for vest Mucinex daytime + suctioning as needed OK to use tussionex 2.5 ml qhs for cough suppression Call if worse

## 2014-06-01 NOTE — Progress Notes (Signed)
   Subjective:    Patient ID: [name], male    DOB: 1986/03/23, 28 y.o.   MRN: [id number]  HPI 28year old male.- accompanied by mother . He has cerebral palsy and needs assistance for all activities of daily living, baseline wheelchair-bound. Underwent T9-C6 spinal fusion at [facility] in Sept 2011.  He had a PEG placed in 2012 for hydration.  He was hospitalized in 08/2013 for aspiration pneumonia,Swallow evaluation showed severe dysphagia with high risk of aspirating. He has 4 caregivers at home.  Apparently there have been some issues with getting him a feeding pump at home, and he is maintained on a dysphagia 3 diet.   06/01/2014 Chief Complaint  Patient presents with  . Acute Visit    reports she is suctioning him frequently-1/2 dozen times. Pt is very congested at night when lying in bed. Secertions are yellow. She is giving pt 5,000mg  of black elderberry extract to help with congestion.   Last seen 7/25 Returns for ongoing congestion & inability to clear secretions Accompanied by mother  Complains increase in fatigue. Pts mother stated he started with a URI, green sputum, was given antibiotics with some improvement but cough has persisted. Cant get mucus to come up , rattles because he cant cough up. No fever, edema , vomiting.  Cough disturbs his sleep and keeps him up at night, hydrocodone cough syrup has been tried without relief  Remains on D3 diet w/ some bolus feeds via peg. However he continues to lose weight, is down to 95 pounds  -awaiting nutrition consult Chest x-ray on 7/25 did not show acute infiltrates   Interim - Saw PCP for dehydration - got IVFs Saw neuro psych - paxil decreased , added zyprexa     Review of Systems neg for any significant sore throat, dysphagia, itching, sneezing, nasal congestion or excess/ purulent secretions, fever, chills, sweats, unintended wt loss, pleuritic or exertional cp, hempoptysis, orthopnea pnd or change in chronic  leg swelling. Also denies presyncope, palpitations, heartburn, abdominal pain, nausea, vomiting, diarrhea or change in bowel or urinary habits, dysuria,hematuria, rash, arthralgias, visual complaints, headache, numbness weakness or ataxia.     Objective:   Physical Exam  Gen. Pleasant, well-nourished, in no distress ENT - no lesions, no post nasal drip Neck: No JVD, no thyromegaly, no carotid bruits Lungs: excessive coughing, gagging & choking with inability to expectorate, neede suctioning, no use of accessory muscles, no dullness to percussion, clear without rales or rhonchi  Cardiovascular: Rhythm regular, heart sounds  normal, no murmurs or gallops, no peripheral edema Musculoskeletal: No deformities, no cyanosis or clubbing        Assessment & Plan:

## 2014-06-08 ENCOUNTER — Ambulatory Visit: Payer: Medicare Other | Admitting: Family Medicine

## 2014-06-08 ENCOUNTER — Ambulatory Visit (INDEPENDENT_AMBULATORY_CARE_PROVIDER_SITE_OTHER): Payer: Medicare Other | Admitting: Family Medicine

## 2014-06-08 ENCOUNTER — Encounter: Payer: Self-pay | Admitting: Family Medicine

## 2014-06-08 VITALS — BP 100/70 | Temp 98.2°F

## 2014-06-08 DIAGNOSIS — J209 Acute bronchitis, unspecified: Secondary | ICD-10-CM | POA: Diagnosis not present

## 2014-06-08 DIAGNOSIS — R1312 Dysphagia, oropharyngeal phase: Secondary | ICD-10-CM

## 2014-06-08 DIAGNOSIS — F411 Generalized anxiety disorder: Secondary | ICD-10-CM | POA: Diagnosis not present

## 2014-06-08 DIAGNOSIS — E0590 Thyrotoxicosis, unspecified without thyrotoxic crisis or storm: Secondary | ICD-10-CM

## 2014-06-08 DIAGNOSIS — R946 Abnormal results of thyroid function studies: Secondary | ICD-10-CM | POA: Diagnosis not present

## 2014-06-08 DIAGNOSIS — E860 Dehydration: Secondary | ICD-10-CM

## 2014-06-08 NOTE — Patient Instructions (Signed)
Try Diazepam 2 mg tabs try 2 at bedtime to see if that helps the sleep and the heat   Insomnia Insomnia is frequent trouble falling and/or staying asleep. Insomnia can be a long term problem or a short term problem. Both are common. Insomnia can be a short term problem when the wakefulness is related to a certain stress or worry. Long term insomnia is often related to ongoing stress during waking hours and/or poor sleeping habits. Overtime, sleep deprivation itself can make the problem worse. Every little thing feels more severe because you are overtired and your ability to cope is decreased. CAUSES   Stress, anxiety, and depression.  Poor sleeping habits.  Distractions such as TV in the bedroom.  Naps close to bedtime.  Engaging in emotionally charged conversations before bed.  Technical reading before sleep.  Alcohol and other sedatives. They may make the problem worse. They can hurt normal sleep patterns and normal dream activity.  Stimulants such as caffeine for several hours prior to bedtime.  Pain syndromes and shortness of breath can cause insomnia.  Exercise late at night.  Changing time zones may cause sleeping problems (jet lag). It is sometimes helpful to have someone observe your sleeping patterns. They should look for periods of not breathing during the night (sleep apnea). They should also look to see how long those periods last. If you live alone or observers are uncertain, you can also be observed at a sleep clinic where your sleep patterns will be professionally monitored. Sleep apnea requires a checkup and treatment. Give your caregivers your medical history. Give your caregivers observations your family has made about your sleep.  SYMPTOMS   Not feeling rested in the morning.  Anxiety and restlessness at bedtime.  Difficulty falling and staying asleep. TREATMENT   Your caregiver may prescribe treatment for an underlying medical disorders. Your caregiver can  give advice or help if you are using alcohol or other drugs for self-medication. Treatment of underlying problems will usually eliminate insomnia problems.  Medications can be prescribed for short time use. They are generally not recommended for lengthy use.  Over-the-counter sleep medicines are not recommended for lengthy use. They can be habit forming.  You can promote easier sleeping by making lifestyle changes such as:  Using relaxation techniques that help with breathing and reduce muscle tension.  Exercising earlier in the day.  Changing your diet and the time of your last meal. No night time snacks.  Establish a regular time to go to bed.  Counseling can help with stressful problems and worry.  Soothing music and white noise may be helpful if there are background noises you cannot remove.  Stop tedious detailed work at least one hour before bedtime. HOME CARE INSTRUCTIONS   Keep a diary. Inform your caregiver about your progress. This includes any medication side effects. See your caregiver regularly. Take note of:  Times when you are asleep.  Times when you are awake during the night.  The quality of your sleep.  How you feel the next day. This information will help your caregiver care for you.  Get out of bed if you are still awake after 15 minutes. Read or do some quiet activity. Keep the lights down. Wait until you feel sleepy and go back to bed.  Keep regular sleeping and waking hours. Avoid naps.  Exercise regularly.  Avoid distractions at bedtime. Distractions include watching television or engaging in any intense or detailed activity like attempting to balance the household  checkbook.  Develop a bedtime ritual. Keep a familiar routine of bathing, brushing your teeth, climbing into bed at the same time each night, listening to soothing music. Routines increase the success of falling to sleep faster.  Use relaxation techniques. This can be using breathing and  muscle tension release routines. It can also include visualizing peaceful scenes. You can also help control troubling or intruding thoughts by keeping your mind occupied with boring or repetitive thoughts like the old concept of counting sheep. You can make it more creative like imagining planting one beautiful flower after another in your backyard garden.  During your day, work to eliminate stress. When this is not possible use some of the previous suggestions to help reduce the anxiety that accompanies stressful situations. MAKE SURE YOU:   Understand these instructions.  Will watch your condition.  Will get help right away if you are not doing well or get worse. Document Released: 10/16/2000 Document Revised: 01/11/2012 Document Reviewed: 11/16/2007 [facility] Patient Information 2015 [place], [place]. This information is not intended to replace advice given to you by your health care provider. Make sure you discuss any questions you have with your health care provider.

## 2014-06-08 NOTE — Progress Notes (Signed)
Pre visit review using our clinic review tool, if applicable. No additional management support is needed unless otherwise documented below in the visit note. 

## 2014-06-09 LAB — TSH: TSH: 0.774 u[IU]/mL (ref 0.350–4.500)

## 2014-06-09 LAB — T3, FREE: T3, Free: 3.6 pg/mL (ref 2.3–4.2)

## 2014-06-09 LAB — T4, FREE: Free T4: 1.38 ng/dL (ref 0.80–1.80)

## 2014-06-10 ENCOUNTER — Encounter: Payer: Self-pay | Admitting: Family Medicine

## 2014-06-10 DIAGNOSIS — R946 Abnormal results of thyroid function studies: Secondary | ICD-10-CM | POA: Insufficient documentation

## 2014-06-10 NOTE — Assessment & Plan Note (Signed)
Improving with treatment.  ?

## 2014-06-10 NOTE — Assessment & Plan Note (Signed)
Thyroid studies wnl today

## 2014-06-10 NOTE — Assessment & Plan Note (Addendum)
Improved recently.  

## 2014-06-10 NOTE — Progress Notes (Signed)
Patient ID: [name], male   DOB: 1986-10-06, 28 y.o.   MRN: [id number] [name] [id number] 01/17/1986 06/10/2014      Progress Note-Follow Up  Subjective  Chief Complaint  Chief Complaint  Patient presents with  . Follow-up    HPI  Patient is a 28 year old male in today for routine medical care with his mother. 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 Has been recovering from a respiratory illness. He is improving but is still coughing and agitated. He has begun to eat and drink some again. No obvious fevers. No new GI c/o is not sleeping well. Requiring ongoing suctioning, usually white or clear. Denies CP/palp/SOB/HAfevers/GI or GU c/o. Taking meds as prescribed Past Medical History  Diagnosis Date  . Cerebral palsy   . GERD (gastroesophageal reflux disease)   . Anxiety   . Depression   . Thyroid disease     hyper  . Incontinence of feces   . Palpitations   . Esophagitis   . Dehydration 11/22/2013    Past Surgical History  Procedure Laterality Date  . Spine surgery  ,11/20/2010, 2011  . Eye surgery    . Ears tubes    . Hamstring released    . Baclofen trial    . Baslofen pump implant    . Spinal fusion    . G-tube insert  August 2006  . Spinal fusioncorrect 106 degree kyphosis    . Spinal fusion to correct 70 degree kyphosis    . Tonsillectomy    . Peg placement  10/21/2011    Procedure: PERCUTANEOUS ENDOSCOPIC GASTROSTOMY (PEG) REPLACEMENT;  Surgeon: [name], MD;  Location: [facility];  Service: Endoscopy;  Laterality: N/A;  . Peg placement N/A 06/13/2013    Procedure: PERCUTANEOUS ENDOSCOPIC GASTROSTOMY (PEG) REPLACEMENT;  Surgeon: [name], MD;  Location: [facility];  Service: Endoscopy;  Laterality: N/A;    Family History  Problem Relation Age of Onset  . Asthma Mother   . Hyperlipidemia Mother   . COPD Mother   . Other Mother     bronchial stasis/ABPA  . Cancer Maternal Grandmother 39    breast  .  Hyperlipidemia Maternal Grandmother   . Hypertension Maternal Grandmother   . Cancer Maternal Grandfather     prostate  . Heart disease Paternal Grandfather     CHF  . Osteoporosis Paternal Grandmother   . Arthritis Paternal Grandmother     rheumatoid    History   Social History  . Marital Status: Single    Spouse Name: N/A    Number of Children: 0  . Years of Education: N/A   Occupational History  . disbaled    Social History Main Topics  . Smoking status: Never Smoker   . Smokeless tobacco: Never Used  . Alcohol Use: No  . Drug Use: No  . Sexual Activity: No   Other Topics Concern  . Not on file   Social History Narrative  . No narrative on file    Current Outpatient Prescriptions on File Prior to Visit  Medication Sig Dispense Refill  . AMBULATORY NON FORMULARY MEDICATION Medication Name: MIC gastrostomy/bolus feeding tube 24 French Part number 0110-24. #2 and 10 cc lurer lock syringe #2 Dx:  2 Device  1  . clotrimazole-betamethasone (LOTRISONE) cream Apply 1 application topically 2 (two) times daily.  45 g  1  . dantrolene (DANTRIUM) 50 MG capsule Take 1 capsule (50 mg total) by mouth 4 (  four) times daily. 1 tab bid and 2 tab at qhs  120 capsule  4  . diazepam (VALIUM) 2 MG tablet Take 0.5-1 tablets (1-2 mg total) by mouth at bedtime as needed for anxiety.  30 tablet  1  . divalproex (DEPAKOTE SPRINKLE) 125 MG capsule Take 2 capsules in morning, 5 capsules at bedtime  210 capsule  4  . Feeding Tubes - Bags (FLEXIFLO FEEDING BAG/PUMP) MISC 1 Units by Does not apply route continuous.  1 each  30  . Feeding Tubes - Pump MISC 1 Units by Does not apply route continuous.  1 each  0  . Feeding Tubes - Sets (KANGAROO EPUMP SET 1000ML) MISC 30 day supply of Kangaroo Joey PUmp set with flush bag  1 each  5  . fluconazole (DIFLUCAN) 40 MG/ML suspension 5 ml per GT daily x 3 dasy then 5 ml weekly x 3 weeks then weekly as needed.  100 mL  1  . levofloxacin (LEVAQUIN) 25 MG/ML  solution Take 500 mg by mouth daily.      Marland Kitchen LORazepam (ATIVAN) 1 MG tablet Take 1 tab po tid prn for anxiety  90 tablet  1  . metoprolol tartrate (LOPRESSOR) 25 MG tablet Take 1 tablet (25 mg total) by mouth 2 (two) times daily.  180 tablet  3  . Nutritional Supplements (FEEDING SUPPLEMENT, OSMOLITE 1.2 CAL,) LIQD Place 1,000 mLs into feeding tube daily.  1000 mL  30  . nystatin cream (MYCOSTATIN) Apply 1 application topically 2 (two) times daily as needed for dry skin.  30 g  1  . OLANZapine (ZYPREXA) 5 MG tablet 1/2 tab at 9am and 1/2 at 3pm      . omeprazole (PRILOSEC) 40 MG capsule Take 40 mg by mouth 2 (two) times daily.      Marland Kitchen PARoxetine (PAXIL) 30 MG tablet tape down per neuro phyciatrist      . Probiotic Product (ADVANCED PROBIOTIC 10) CAPS 2 tablespoons      . sucralfate (CARAFATE) 1 G tablet 1 tablet twice daily via PEG  60 tablet  1   No current facility-administered medications on file prior to visit.    Allergies  Allergen Reactions  . Ambien [Zolpidem Tartrate] Nausea Only  . Codeine   . Baclofen Anxiety  . Diazepam Anxiety  . Sulfonamide Derivatives Rash    Review of Systems  Review of Systems  Constitutional: Negative for fever and malaise/fatigue.  HENT: Positive for congestion.   Eyes: Negative for discharge.  Respiratory: Positive for cough and sputum production. Negative for shortness of breath.   Cardiovascular: Negative for chest pain, palpitations and leg swelling.  Gastrointestinal: Negative for nausea, abdominal pain, diarrhea, constipation, blood in stool and melena.  Genitourinary: Negative for dysuria.  Musculoskeletal: Negative for falls.  Skin: Negative for rash.  Neurological: Negative for loss of consciousness and headaches.  Endo/Heme/Allergies: Negative for polydipsia.  Psychiatric/Behavioral: Negative for depression and suicidal ideas. The patient is nervous/anxious and has insomnia.     Objective  BP 100/70  Temp(Src) 98.2 F (36.8 C)  (Oral)  Physical Exam  Physical Exam  Constitutional: He is oriented to person, place, and time and well-developed, well-nourished, and in no distress. No distress.  HENT:  Head: Normocephalic and atraumatic.  Eyes: Conjunctivae are normal.  Neck: Neck supple. No thyromegaly present.  Cardiovascular: Normal rate, regular rhythm and normal heart sounds.   No murmur heard. Pulmonary/Chest: Effort normal and breath sounds normal. No respiratory distress.  Abdominal: He  exhibits no distension and no mass. There is no tenderness.  Musculoskeletal: He exhibits no edema.  Neurological: He is alert and oriented to person, place, and time.  Skin: Skin is warm.  Psychiatric: Memory, affect and judgment normal.    Lab Results  Component Value Date   TSH 0.774 06/08/2014   Lab Results  Component Value Date   WBC 5.8 05/17/2014   HGB 14.3 05/17/2014   HCT 43.5 05/17/2014   MCV 87.2 05/17/2014   PLT 249 05/17/2014   Lab Results  Component Value Date   CREATININE 0.40* 05/17/2014   BUN 8 05/17/2014   NA 141 05/17/2014   K 4.1 05/17/2014   CL 99 05/17/2014   CO2 26 05/17/2014   Lab Results  Component Value Date   ALT 8 05/17/2014   AST 13 05/17/2014   ALKPHOS 65 05/17/2014   BILITOT 0.3 05/17/2014   Lab Results  Component Value Date   CHOL 151 04/25/2010   Lab Results  Component Value Date   HDL 29.90* 04/25/2010   Lab Results  Component Value Date   LDLCALC 96 04/25/2010   Lab Results  Component Value Date   TRIG 125.0 04/25/2010   Lab Results  Component Value Date   CHOLHDL 5 04/25/2010     Assessment & Plan  Dysphagia, oropharyngeal phase Parents have purchased a feeding pump out of pocket secondary to so much trouble with insurance. He has begun to eat po again slightly, they will proceed with a combo of feeding tube and po as tolerated  Acute bronchitis Improving with treatment  ANXIETY Following closely with psychiatry but still easily agitated, may try 2 Diazepam  especially qhs as needed  HYPERTHYROIDISM Thyroid studies wnl today  Dehydration Improved recently

## 2014-06-10 NOTE — Assessment & Plan Note (Signed)
Parents have purchased a feeding pump out of pocket secondary to so much trouble with insurance. He has begun to eat po again slightly, they will proceed with a combo of feeding tube and po as tolerated

## 2014-06-10 NOTE — Assessment & Plan Note (Addendum)
Following closely with psychiatry but still easily agitated, may try 2 Diazepam especially qhs as needed

## 2014-06-14 ENCOUNTER — Ambulatory Visit: Payer: Medicare Other | Admitting: Family Medicine

## 2014-06-14 DIAGNOSIS — F39 Unspecified mood [affective] disorder: Secondary | ICD-10-CM | POA: Diagnosis not present

## 2014-06-18 ENCOUNTER — Telehealth: Payer: Self-pay

## 2014-06-18 MED ORDER — CEFDINIR 250 MG/5ML PO SUSR: 250.0000 mg | Freq: Two times a day (BID) | ORAL | Status: DC

## 2014-06-18 NOTE — Telephone Encounter (Signed)
Patients mother left a message stating that patient has a boil on the front of his shin, leg swollen down to ankle, redness the size of half dollar- darker red, purplish. pts mother has applied hot compresses, antibiotic salve and had some hydro cleanse in her medicine cabinet that she used also. Some stuff came out when the hot compress was applied.  pts mother stated her husband had this in the past and was given an antibiotic for a boil?  Please advise?

## 2014-06-18 NOTE — Telephone Encounter (Signed)
Can have Cefdinir liquid 250/5 6 cc down peg tube bid x 10 days will treat an abscess.

## 2014-06-18 NOTE — Telephone Encounter (Signed)
Patients mother informed and RX sent to [facility] per mothers request

## 2014-06-19 ENCOUNTER — Telehealth: Payer: Self-pay | Admitting: Pulmonary Disease

## 2014-06-19 NOTE — Telephone Encounter (Signed)
Returned called to [facility] for Marquita to return my call. New Form faxed over on patient's Vest order, completed requested information and faxed back to [facility] at [phone]. Waiting on return call to verify that this is all that was needed. Rhonda J Cobb

## 2014-06-20 NOTE — Telephone Encounter (Signed)
Marquita with [facility] returned my call and stated that they have everything they need now to move forward with obtaining patient the in [facility] V[facility]. Nothing else is needed. Pt/family will be contacted to arrange delivery and set up by one of [facility]'s nurses. Rhonda J Cobb

## 2014-06-23 IMAGING — CR DG CHEST 2V
1 series · 1 of 1 positions shown · non-contrast
Comparison: Chest x-ray 08/04/2013.

CLINICAL DATA: Cough and congestion.

EXAM:
CHEST  2 VIEW

[view not recorded]
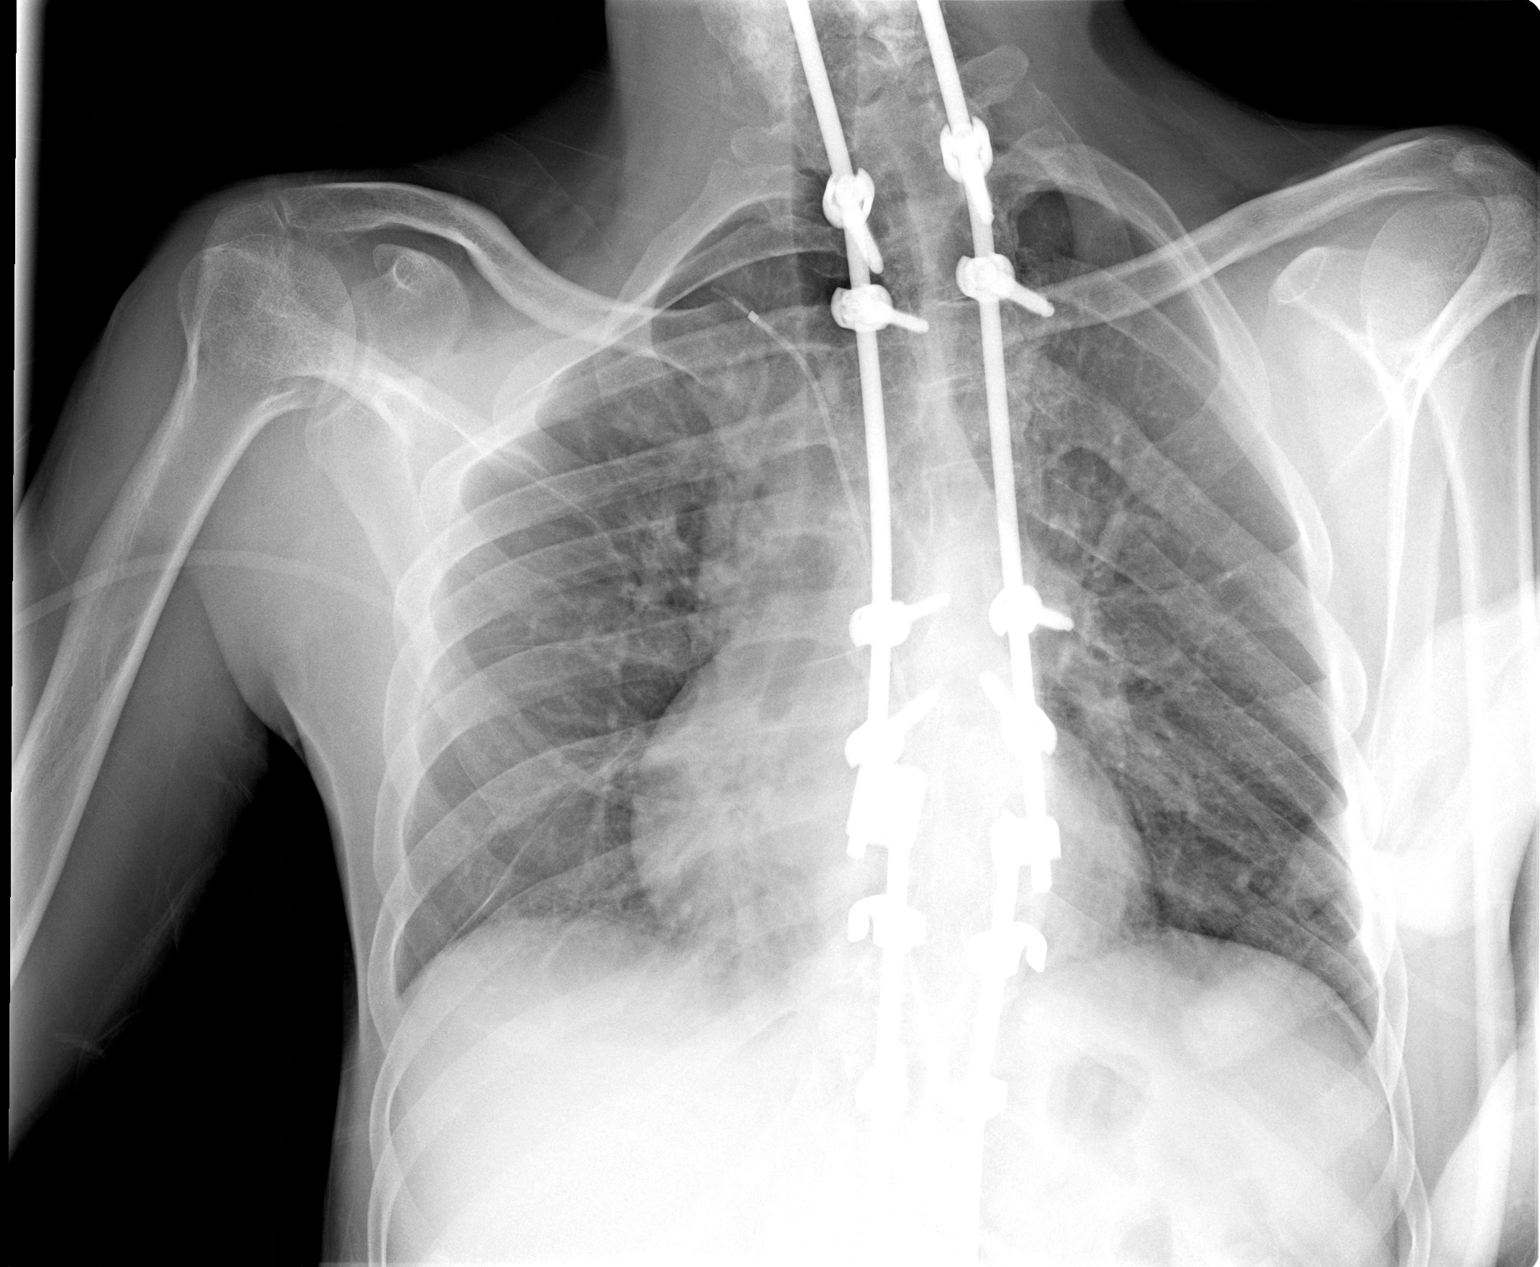

[1 of 1 positions shown; findings below may reference images not displayed]

FINDINGS: Lung volumes are normal. No consolidative airspace disease. No
pleural effusions. No evidence of pulmonary edema. Heart size is
normal. The patient is rotated to the right on today's exam,
resulting in distortion of the mediastinal contours and reduced
diagnostic sensitivity and specificity for mediastinal pathology.
There is either an abandoned catheter or old abandoned pacemaker
wire seen projecting over the central upper thorax, which is not
confidently localized on the lateral projection. This is similar to
prior examinations. Harrington rods are noted throughout the
visualized cervical, thoracic and lumbar spine.
IMPRESSION: 1. No radiographic evidence of acute cardiopulmonary disease. The
appearance of the chest is similar to prior studies, as above.

## 2014-06-25 ENCOUNTER — Encounter: Payer: Self-pay | Admitting: Pulmonary Disease

## 2014-06-27 ENCOUNTER — Encounter: Payer: Self-pay | Admitting: Physical Medicine & Rehabilitation

## 2014-06-27 ENCOUNTER — Encounter: Payer: Medicare Other | Attending: Physical Medicine & Rehabilitation | Admitting: Physical Medicine & Rehabilitation

## 2014-06-27 VITALS — BP 97/44 | HR 60 | Resp 16

## 2014-06-27 DIAGNOSIS — G809 Cerebral palsy, unspecified: Secondary | ICD-10-CM | POA: Diagnosis not present

## 2014-06-27 DIAGNOSIS — G8250 Quadriplegia, unspecified: Secondary | ICD-10-CM | POA: Diagnosis not present

## 2014-06-27 NOTE — Progress Notes (Signed)
Botox Injection for spasticity using needle EMG guidance  Indication: spastic tetraplegia, torticollis--- Traps, HAD's, quads  Dilution: 100 Units/ml Total Units Injected: 400 Bilateral hip adductors and trap/quads  Indication: Severe spasticity which interferes with ADL,mobility and/or hygiene and is unresponsive to medication management and other conservative care  Informed consent was obtained after describing risks and benefits of the procedure with the patient. This includes bleeding, bruising, infection, excessive weakness, or medication side effects. A REMS form is on file and signed.  Needle: 40mm injectable monopolar needle electrode  Number of units per muscle  Right Trap 0 today  Left Trap 50 units in 4 access points  Right SCM 0  Left SCM 0 Hip Adductor Right 75 units, 3 access points  Hip Adductor Left 75 units 3 access points  RIght quad 100units, 3 access points Left quad 100 units, 3 access points All injections were done after obtaining appropriate EMG activity and after negative drawback for blood. The patient tolerated the procedure well. Post procedure instructions were given. A followup appointment was made.   We decided to be less aggressive in the neck today given his recent URI and the fact that some of there chest PT and associated therapy has been helpful for his tone there.  Follow up with me in about 2 months.

## 2014-06-27 NOTE — Patient Instructions (Signed)
PLEASE CALL ME WITH ANY PROBLEMS OR QUESTIONS (#[phone]).      

## 2014-06-28 ENCOUNTER — Telehealth: Payer: Self-pay | Admitting: Emergency Medicine

## 2014-06-28 IMAGING — CR DG CHEST 2V
1 series · 1 of 1 positions shown · non-contrast
Comparison: 11/17/2013; 08/04/2013

CLINICAL DATA: Dehydration, upper respiratory symptoms

EXAM:
CHEST  2 VIEW

[view not recorded]
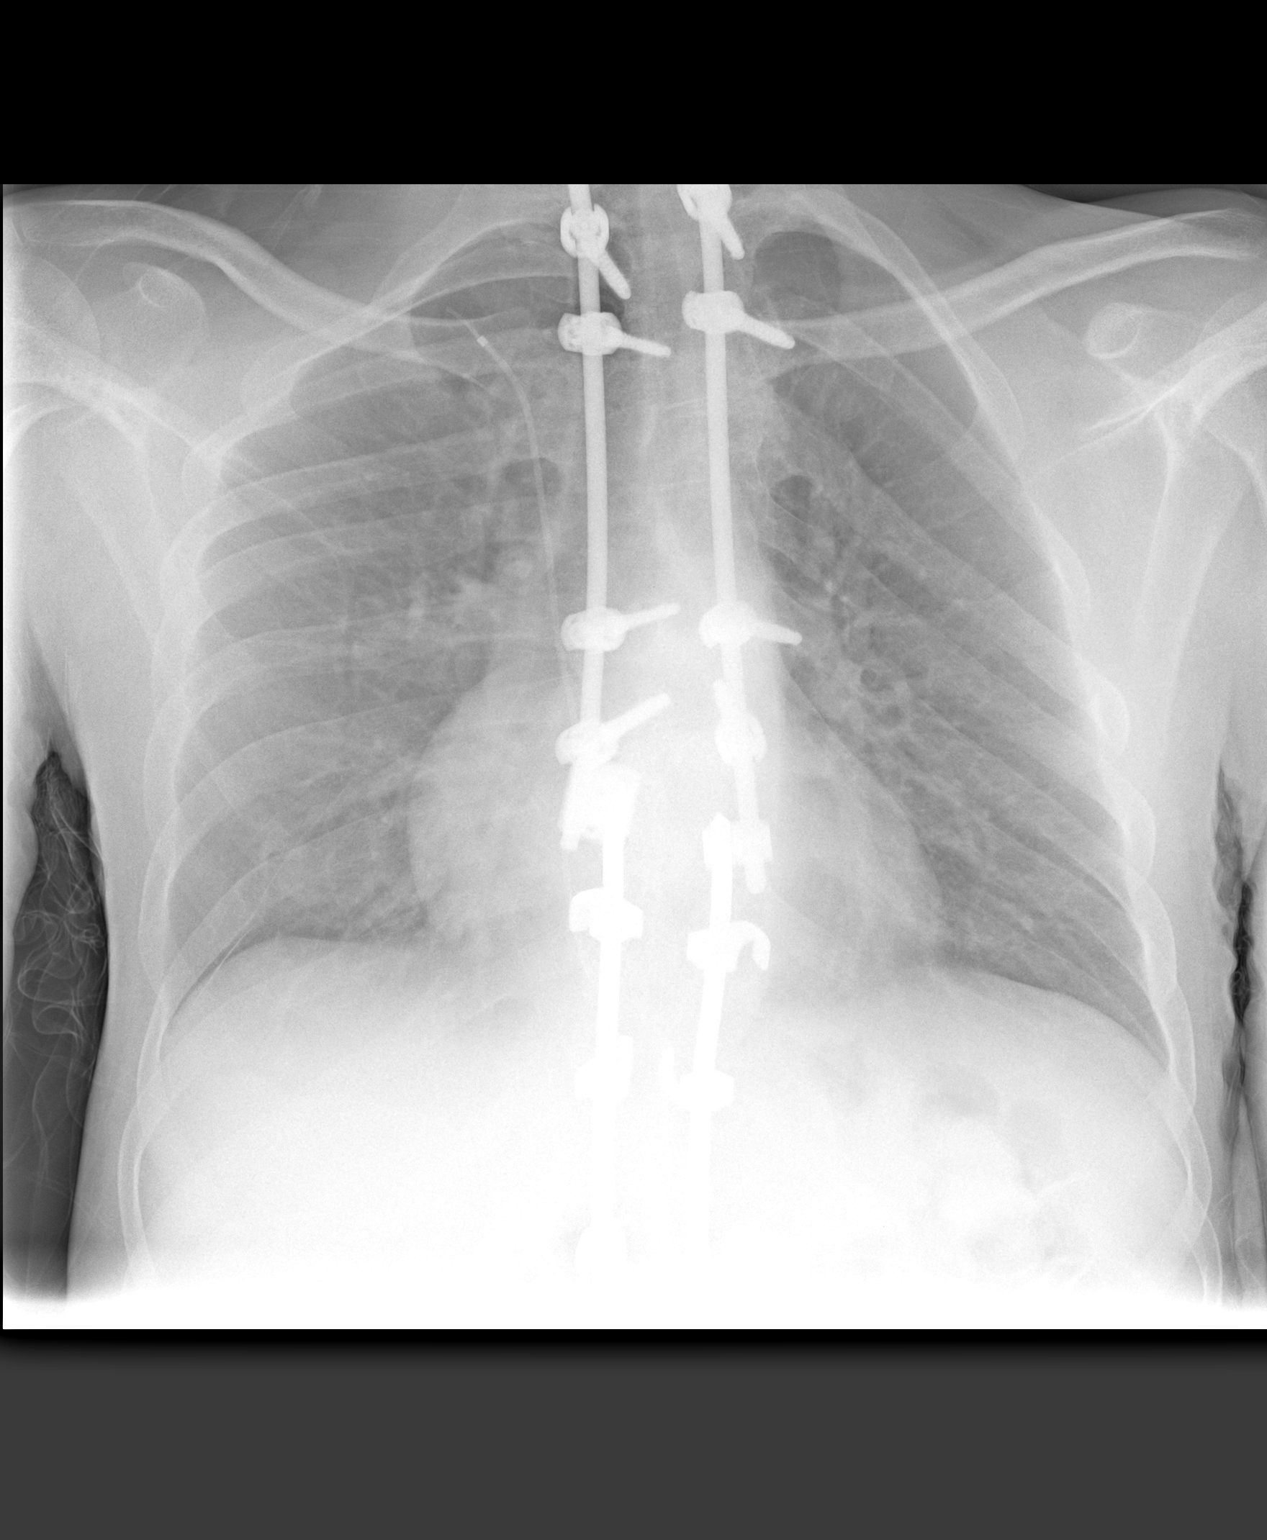

[1 of 1 positions shown; findings below may reference images not displayed]

FINDINGS: Grossly unchanged cardiac silhouette and mediastinal contours. There
is grossly unchanged mild diffuse slightly nodular thickening of the
pulmonary interstitium. Perihilar heterogeneous opacities are
grossly unchanged, right greater than left, likely atelectasis. No
new discrete focal airspace opacities. No definite pleural effusion
or pneumothorax. Grossly unchanged bones including long segment
paraspinal cervical, thoracic and lumbar paraspinal fusion.
Depending catheter tubing again overlies the medial aspect of the
right side of the mediastinum.
IMPRESSION: Grossly unchanged perihilar atelectasis without acute
cardiopulmonary disease.

## 2014-06-28 NOTE — Telephone Encounter (Signed)
Mother of [name] wrote letter states pt needs suction supplies. Pt last seen by RA on 06/01/2014.   Per pt's mother, pt needs: -Suction canister -Suction yankauer ([employer] [id number], with bulp tip) -Suction tubing  Rx can be sent to: [facility] medical supply [place], [place] [address] Phone: [phone]  RA please advise if to continue with order. Letter from pt's mother is in your look-at's.

## 2014-06-28 NOTE — Telephone Encounter (Signed)
OK to send.

## 2014-06-28 NOTE — Telephone Encounter (Signed)
I have faxed the below orders to [facility] to [phone] Copy placed in Dr Bari Mantis scan folder

## 2014-06-29 ENCOUNTER — Telehealth: Payer: Self-pay | Admitting: Pulmonary Disease

## 2014-06-29 IMAGING — CT CT ABD-PELV W/ CM
2 of 4 series · 15 of 46 positions shown, 17 images · IV contrast (APPLIED)
Comparison: CT of the abdomen and pelvis performed 02/06/2010

CLINICAL DATA: Clogged G-tube.  Vomiting.

EXAM:
CT ABDOMEN AND PELVIS WITH CONTRAST
TECHNIQUE: Multidetector CT imaging of the abdomen and pelvis was performed
using the standard protocol following bolus administration of
intravenous contrast.
CONTRAST:  100 mL of Omnipaque 300 IV contrast

[Series 2: abd/ pelvis 5.0 i30f 1 · axial · 0.64mm/px · z∈[-820,-440]mm · 12 of 87 slices shown, 14 images]
[im 7/87  soft-tissue]
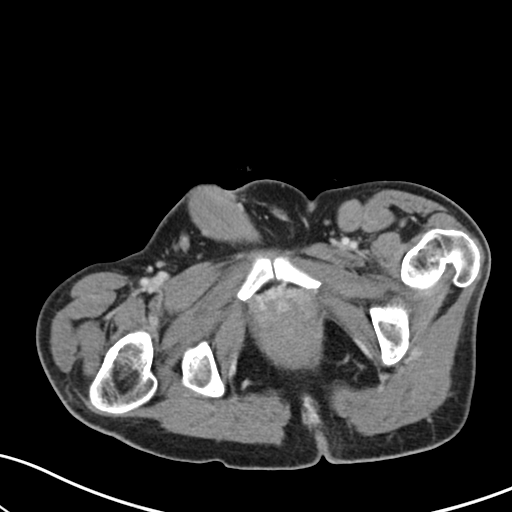
[im 7/87  bone]
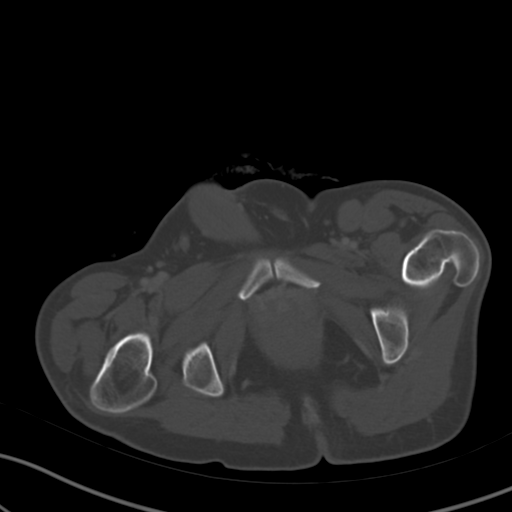
[im 14/87  soft-tissue]
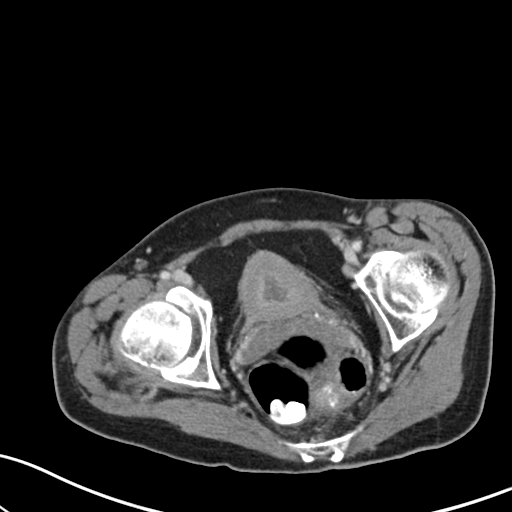
[im 21/87  soft-tissue]
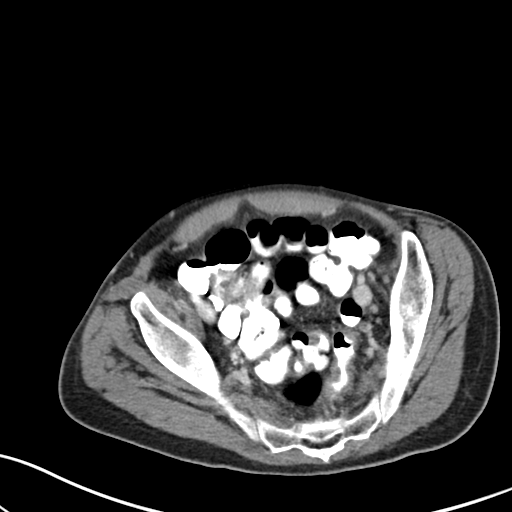
[im 28/87  soft-tissue]
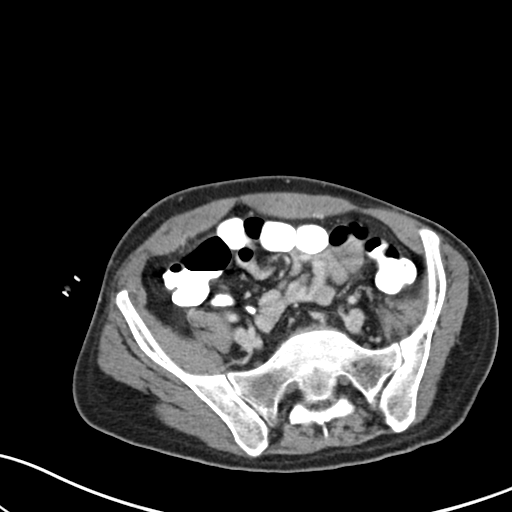
[im 35/87  soft-tissue]
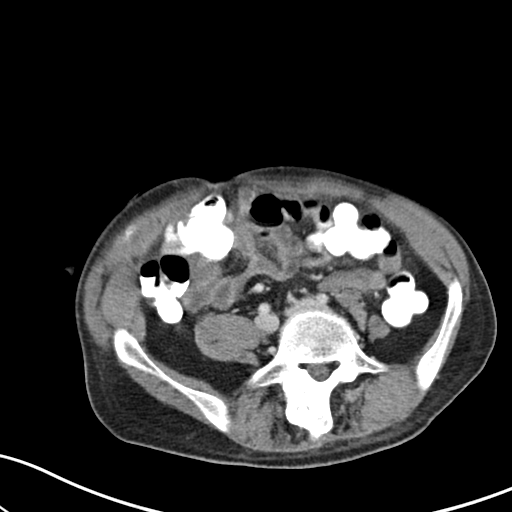
[im 42/87  soft-tissue]
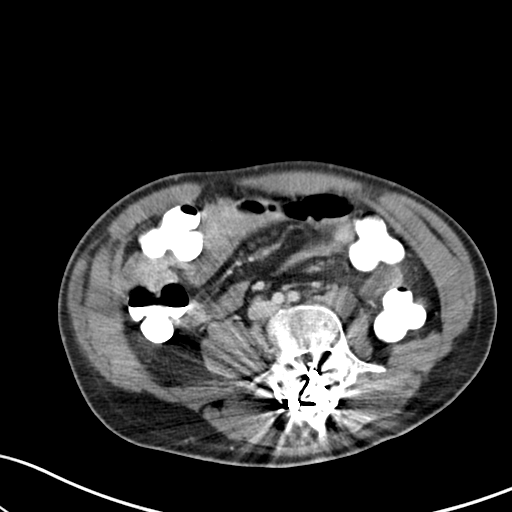
[im 49/87  soft-tissue]
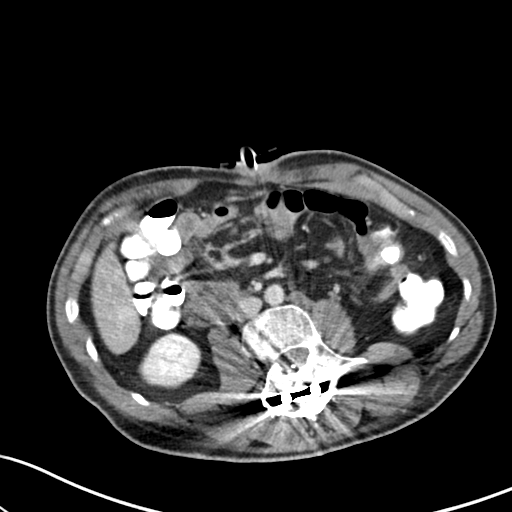
[im 56/87  soft-tissue]
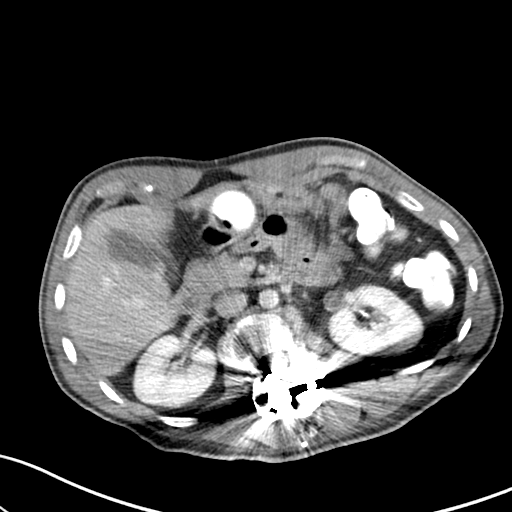
[im 62/87  soft-tissue]
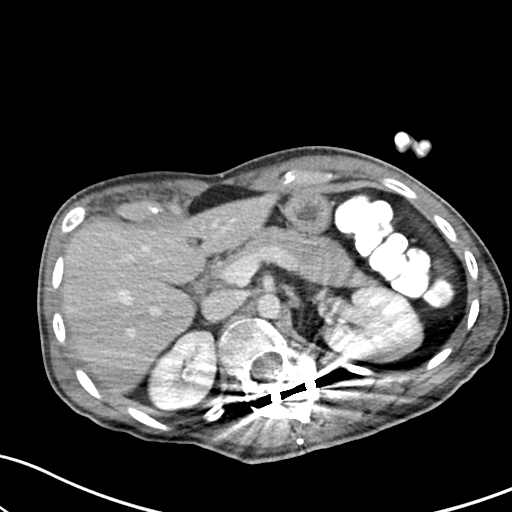
[im 62/87  bone]
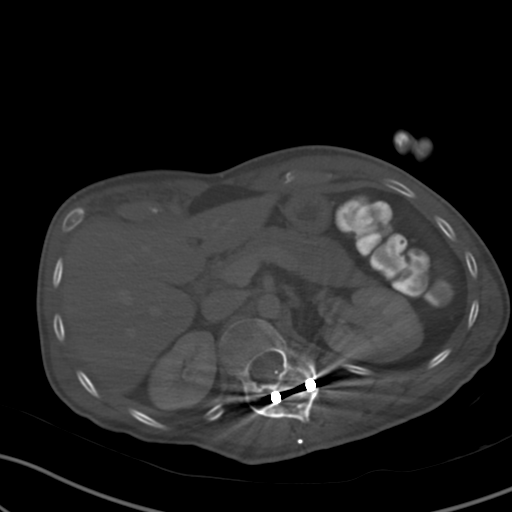
[im 69/87  soft-tissue]
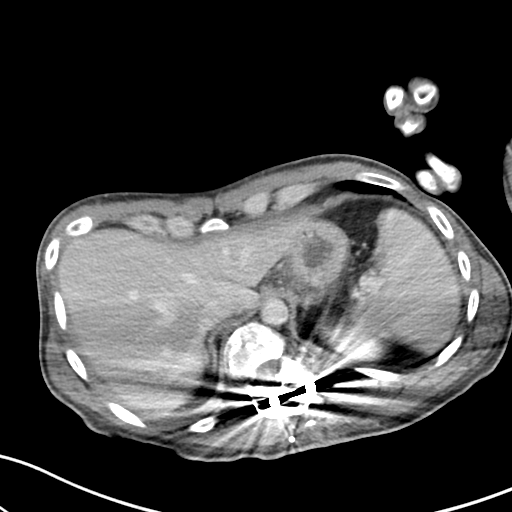
[im 76/87  soft-tissue]
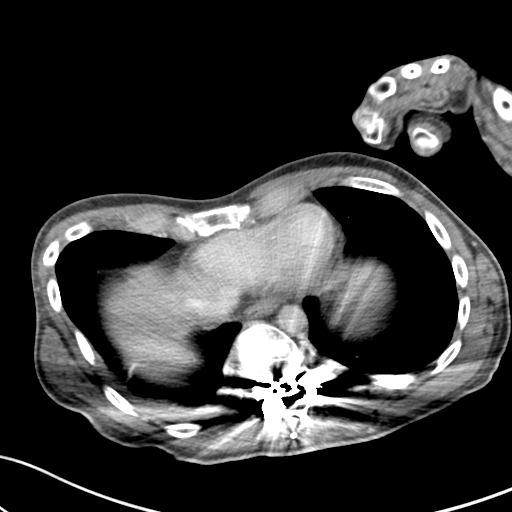
[im 83/87  soft-tissue]
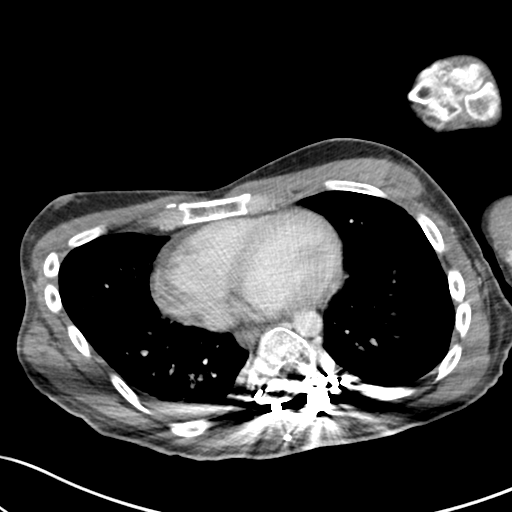

[Series 5: coronal soft tissue · coronal · 0.61mm/px · 3 of 70 slices shown]
[im 24/70  soft-tissue]
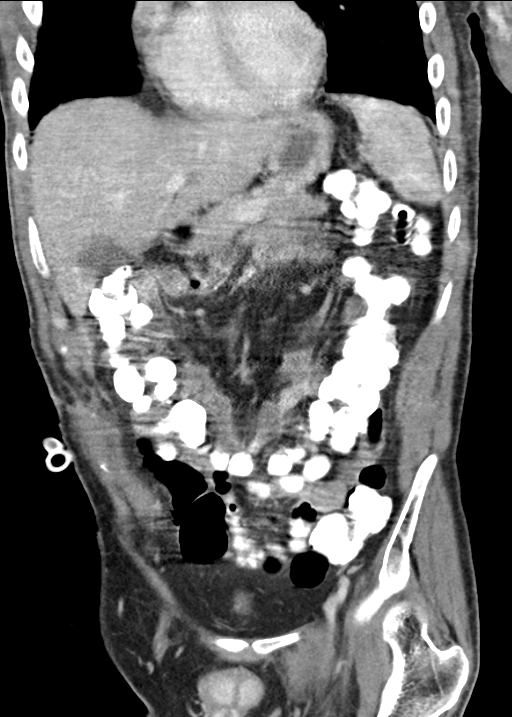
[im 31/70  soft-tissue]
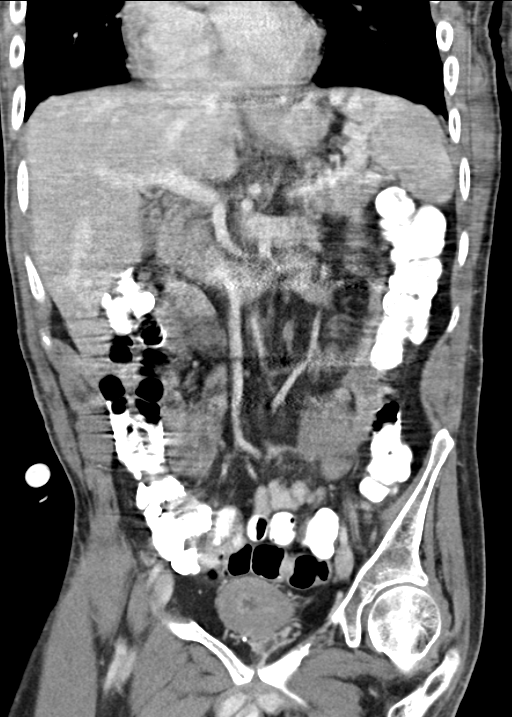
[im 39/70  soft-tissue]
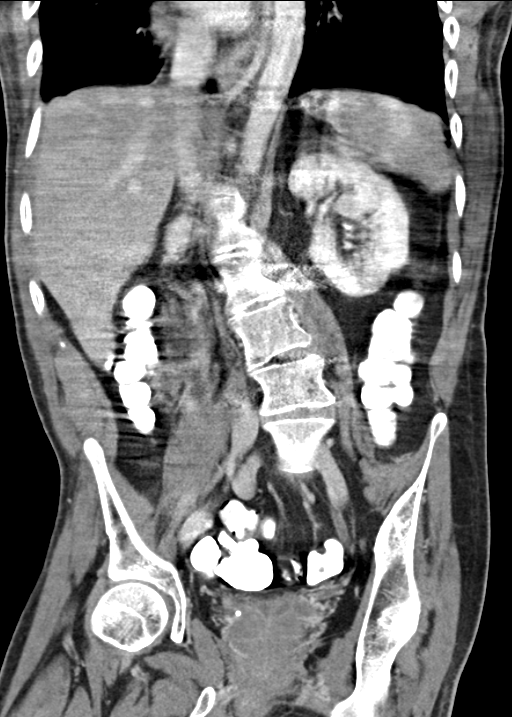

[15 of 46 positions shown; findings below may reference images not displayed]

FINDINGS: Right basilar airspace opacity likely reflects atelectasis, though
mild aspiration might have a similar appearance, given clinical
concern.

The liver and spleen are unremarkable in appearance. The gallbladder
is within normal limits. The pancreas and adrenal glands are
unremarkable.

The kidneys are unremarkable in appearance. There is no evidence of
hydronephrosis. No renal or ureteral stones are seen, though
evaluation for renal stones is limited given contrast within the
renal calyces. No perinephric stranding is appreciated.

The patient's G-tube is seen ending at the antrum of the stomach;
the balloon is slightly larger than typically seen, filled with
approximately 24 mL of contrast. The stomach is largely
decompressed, containing trace air and fluid.

No free fluid is identified. The small bowel is unremarkable in
appearance. No acute vascular abnormalities are seen.

The appendix is filled with contrast and air, without evidence for
appendicitis. Contrast is seen filling the colon. The colon is
unremarkable in appearance.

The bladder is decompressed. Marked bladder wall thickening may
reflect chronic neurogenic bladder. The prostate is borderline
normal in size. No inguinal lymphadenopathy is seen.

No acute osseous abnormalities are identified. Right convex
thoracolumbar scoliosis is noted. Thoracolumbar spinal fusion rods
are noted.
IMPRESSION: 1. G-tube noted ending at the antrum of the stomach; the balloon is
slightly larger than typically seen, filled with approximately 24 mL
of contrast. The stomach is largely decompressed and grossly
unremarkable in appearance. The G-tube is directed towards the
pylorus.
2. Marked bladder wall thickening is thought to reflect chronic
neurogenic bladder.
3. Right basilar airspace opacity likely reflects atelectasis,
though mild aspiration might have a similar appearance, given
clinical concern.
4. Right convex thoracolumbar scoliosis noted, with associated
hardware.

## 2014-06-29 NOTE — Telephone Encounter (Signed)
Called and spoke with cheryl and she will do 6 tubing and 6 yaunkers and the family can call for refills of these when needed.  Nothing further is needed.

## 2014-06-29 NOTE — Telephone Encounter (Signed)
Calling back needing to know how much supplies this pt needs, please response asap.[name]

## 2014-06-29 NOTE — Telephone Encounter (Signed)
RA this was the AVS instructions from last OV on 06/01/14  COmplete course on levaquin - use probiotic  We will put in request for vest  Mucinex daytime + suctioning as needed  OK to use tussionex 2.5 ml qhs for cough suppression  Call if worse  Questions about the suction?   They say order was faxed to suction supplys and they needed more information about the set up of suction--how often? Etc.  Please advise. thanks  Allergies  Allergen Reactions  . Ambien [Zolpidem Tartrate] Nausea Only  . Codeine   . Baclofen Anxiety  . Diazepam Anxiety  . Sulfonamide Derivatives Rash   Current Outpatient Prescriptions on File Prior to Visit  Medication Sig Dispense Refill  . AMBULATORY NON FORMULARY MEDICATION Medication Name: MIC gastrostomy/bolus feeding tube 24 French Part number 0110-24. #2 and 10 cc lurer lock syringe #2 Dx:  2 Device  1  . cefdinir (OMNICEF) 250 MG/5ML suspension Take 5 mLs (250 mg total) by mouth 2 (two) times daily. 6cc down peg tube bid X 10 days  60 mL  0  . clotrimazole-betamethasone (LOTRISONE) cream Apply 1 application topically 2 (two) times daily.  45 g  1  . dantrolene (DANTRIUM) 50 MG capsule Take 1 capsule (50 mg total) by mouth 4 (four) times daily. 1 tab bid and 2 tab at qhs  120 capsule  4  . diazepam (VALIUM) 2 MG tablet Take 0.5-1 tablets (1-2 mg total) by mouth at bedtime as needed for anxiety.  30 tablet  1  . divalproex (DEPAKOTE SPRINKLE) 125 MG capsule Take 2 capsules in morning, 5 capsules at bedtime  210 capsule  4  . Feeding Tubes - Bags (FLEXIFLO FEEDING BAG/PUMP) MISC 1 Units by Does not apply route continuous.  1 each  30  . Feeding Tubes - Pump MISC 1 Units by Does not apply route continuous.  1 each  0  . Feeding Tubes - Sets (KANGAROO EPUMP SET 1000ML) MISC 30 day supply of Kangaroo Joey PUmp set with flush bag  1 each  5  . fluconazole (DIFLUCAN) 40 MG/ML suspension 5 ml per GT daily x 3 dasy then 5 ml weekly x 3 weeks then weekly as needed.   100 mL  1  . levofloxacin (LEVAQUIN) 25 MG/ML solution Take 500 mg by mouth daily.      Marland Kitchen LORazepam (ATIVAN) 1 MG tablet Take 1 tab po tid prn for anxiety  90 tablet  1  . metoprolol tartrate (LOPRESSOR) 25 MG tablet Take 1 tablet (25 mg total) by mouth 2 (two) times daily.  180 tablet  3  . Nutritional Supplements (FEEDING SUPPLEMENT, OSMOLITE 1.2 CAL,) LIQD Place 1,000 mLs into feeding tube daily.  1000 mL  30  . nystatin cream (MYCOSTATIN) Apply 1 application topically 2 (two) times daily as needed for dry skin.  30 g  1  . OLANZapine (ZYPREXA) 5 MG tablet 1/2 tab at 9am and 1/2 at 3pm      . omeprazole (PRILOSEC) 40 MG capsule Take 40 mg by mouth 2 (two) times daily.      Marland Kitchen oxybutynin (DITROPAN) 5 MG/5ML syrup       . PARoxetine (PAXIL) 30 MG tablet tape down per neuro phyciatrist      . Probiotic Product (ADVANCED PROBIOTIC 10) CAPS 2 tablespoons      . sucralfate (CARAFATE) 1 G tablet 1 tablet twice daily via PEG  60 tablet  1   No current facility-administered medications  on file prior to visit.

## 2014-06-29 NOTE — Telephone Encounter (Signed)
Pl look at last phone note.  They only need supplies - bid prn ok

## 2014-07-05 IMAGING — CT CT HEAD W/O CM
2 of 3 series · 16 of 30 positions shown, 19 images · non-contrast
Comparison: None

CLINICAL DATA: Altered mental status.

EXAM:
CT HEAD WITHOUT CONTRAST
TECHNIQUE: Contiguous axial images were obtained from the base of the skull
through the vertex without intravenous contrast.

[Series 2: head 5.0 h30s · axial · 0.42mm/px · z∈[-128,-8]mm · 6 of 34 slices shown]
[im 5/34  brain]
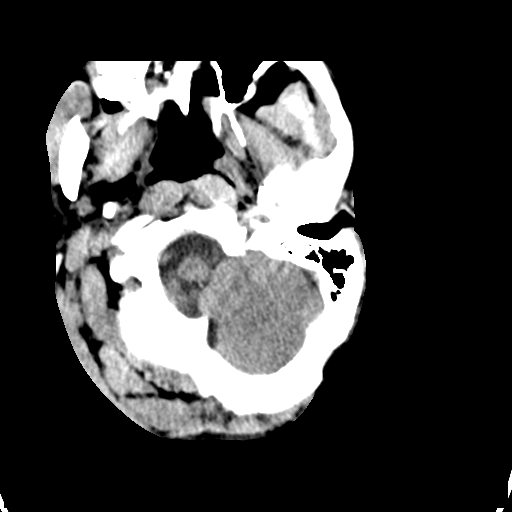
[im 10/34  brain]
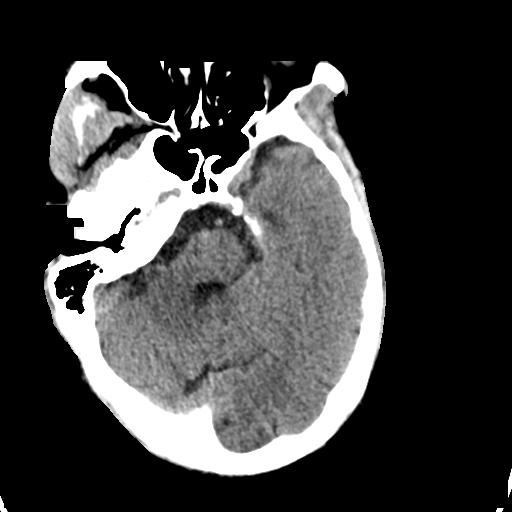
[im 15/34  brain]
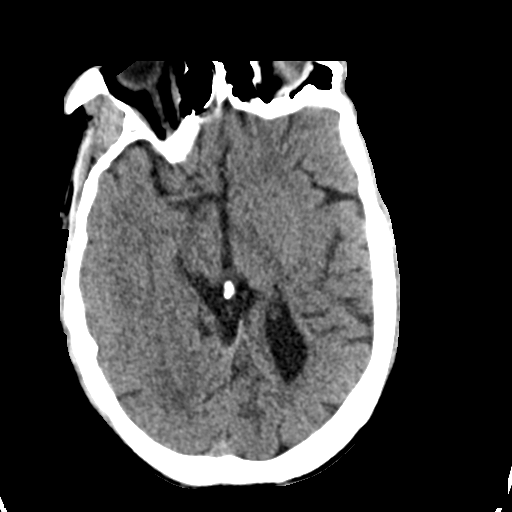
[im 19/34  brain]
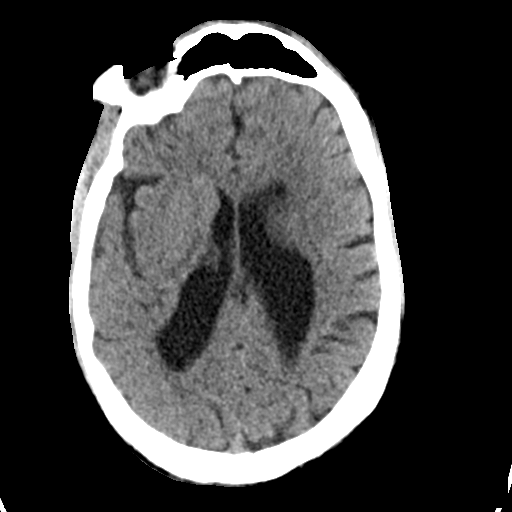
[im 24/34  brain]
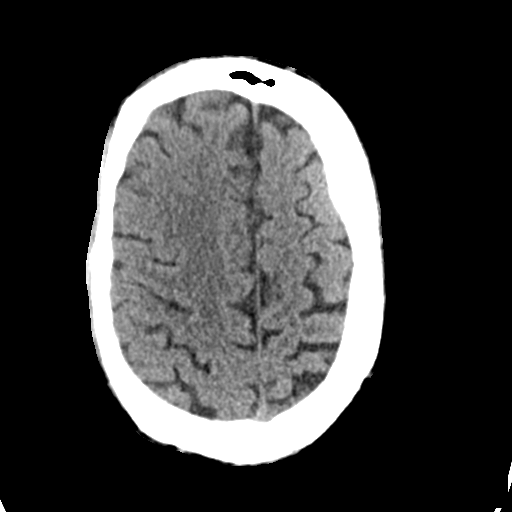
[im 29/34  brain]
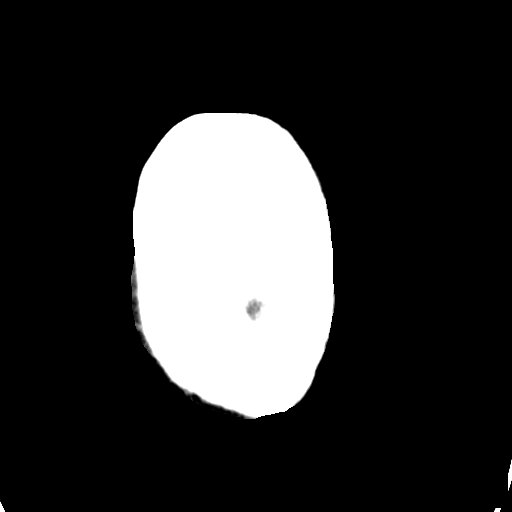

[Series 603: recon st · axial · 0.46mm/px · z∈[-101,+17]mm · 10 of 51 slices shown, 13 images]
[im 5/51  brain]
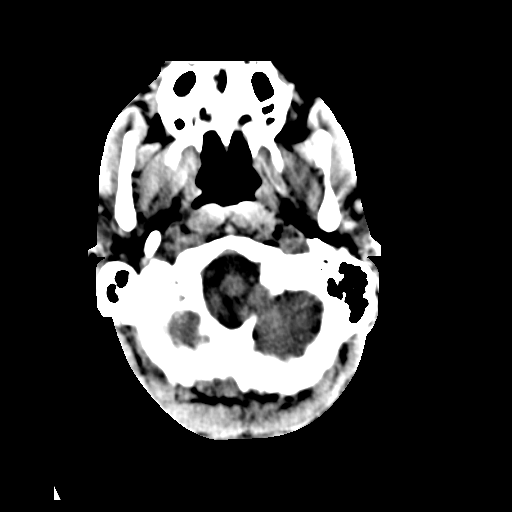
[im 5/51  bone]
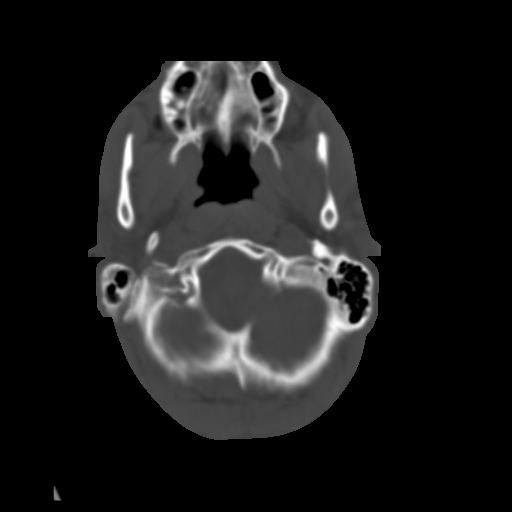
[im 10/51  brain]
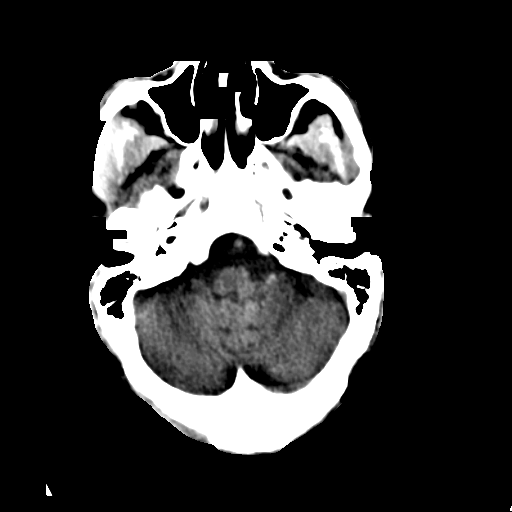
[im 14/51  brain]
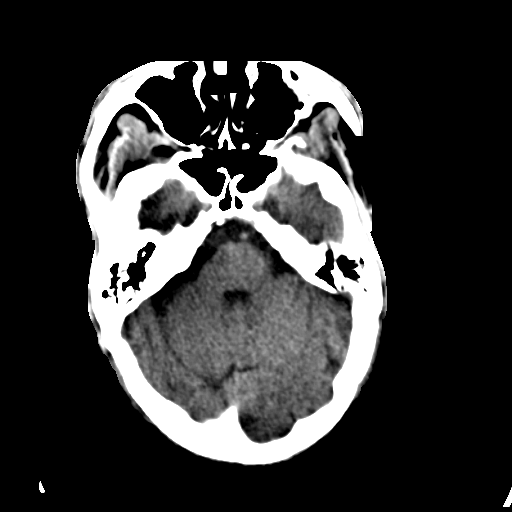
[im 19/51  brain]
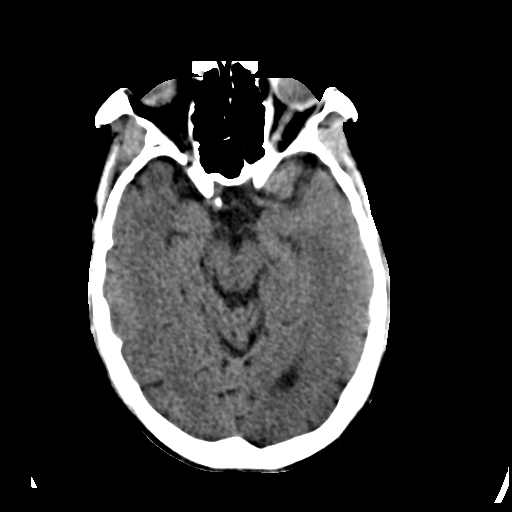
[im 23/51  brain]
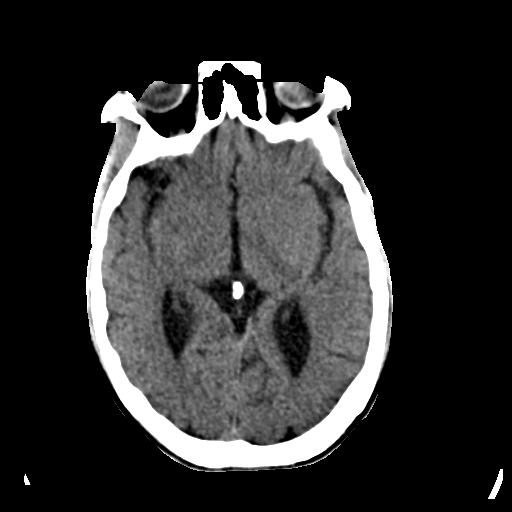
[im 23/51  bone]
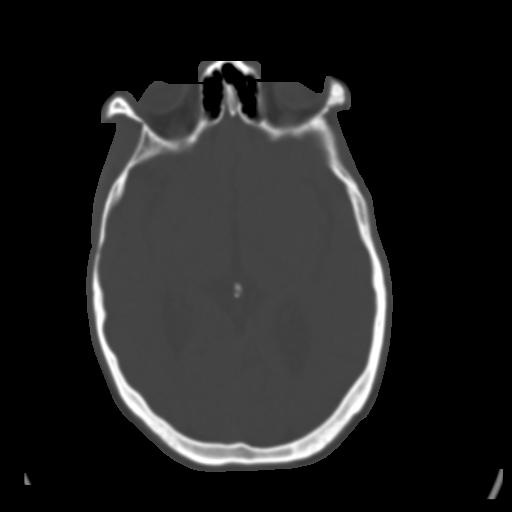
[im 28/51  brain]
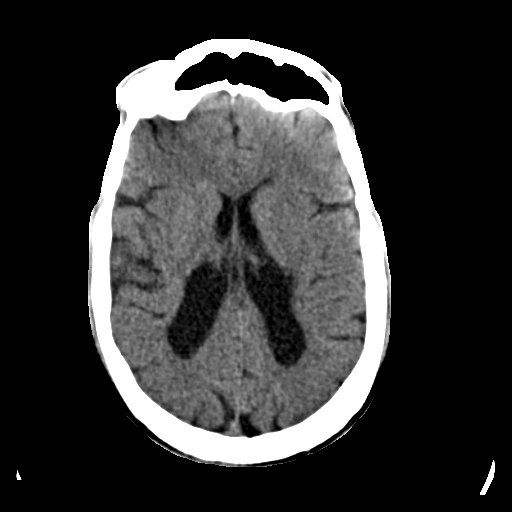
[im 32/51  brain]
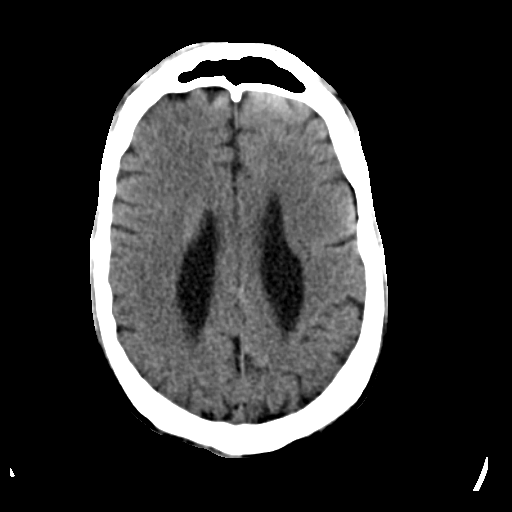
[im 37/51  brain]
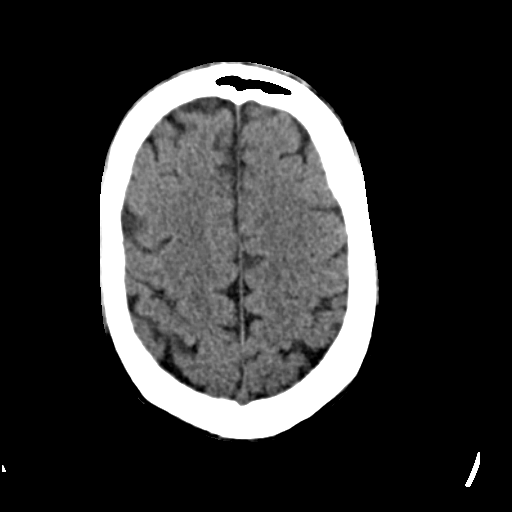
[im 41/51  brain]
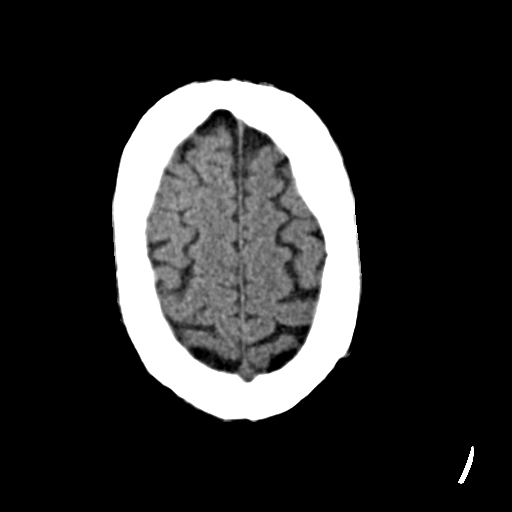
[im 41/51  bone]
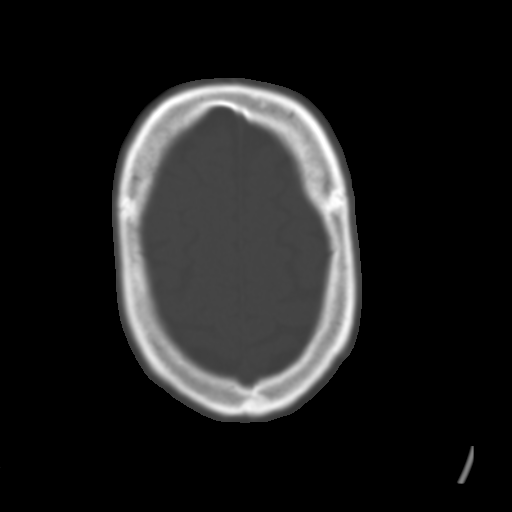
[im 46/51  brain]
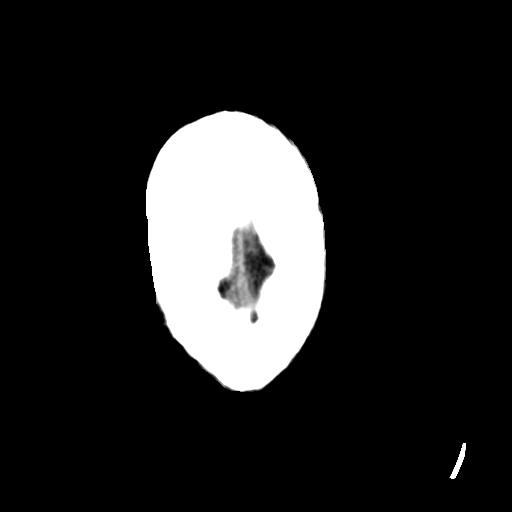

[16 of 30 positions shown; findings below may reference images not displayed]

FINDINGS: No mass lesion. No midline shift. No acute hemorrhage or hematoma.
No extra-axial fluid collections. No evidence of acute infarction.
There is congenital deformity of the brain primarily of the frontal
lobes. There is slight distortion of the ventricles which is felt to
be chronic. No acute osseous abnormalities.
IMPRESSION: No acute abnormalities. Chronic probable congenital abnormalities of
the brain as described.

## 2014-07-05 IMAGING — CR DG ABDOMEN ACUTE W/ 1V CHEST
2 series · 2 of 2 positions shown · non-contrast
Comparison: 11/23/2013

CLINICAL DATA: Cerebral palsy.  Abdominal pain.

EXAM:
ACUTE ABDOMEN SERIES (ABDOMEN 2 VIEW & CHEST 1 VIEW)

[view not recorded (1 of 2)]
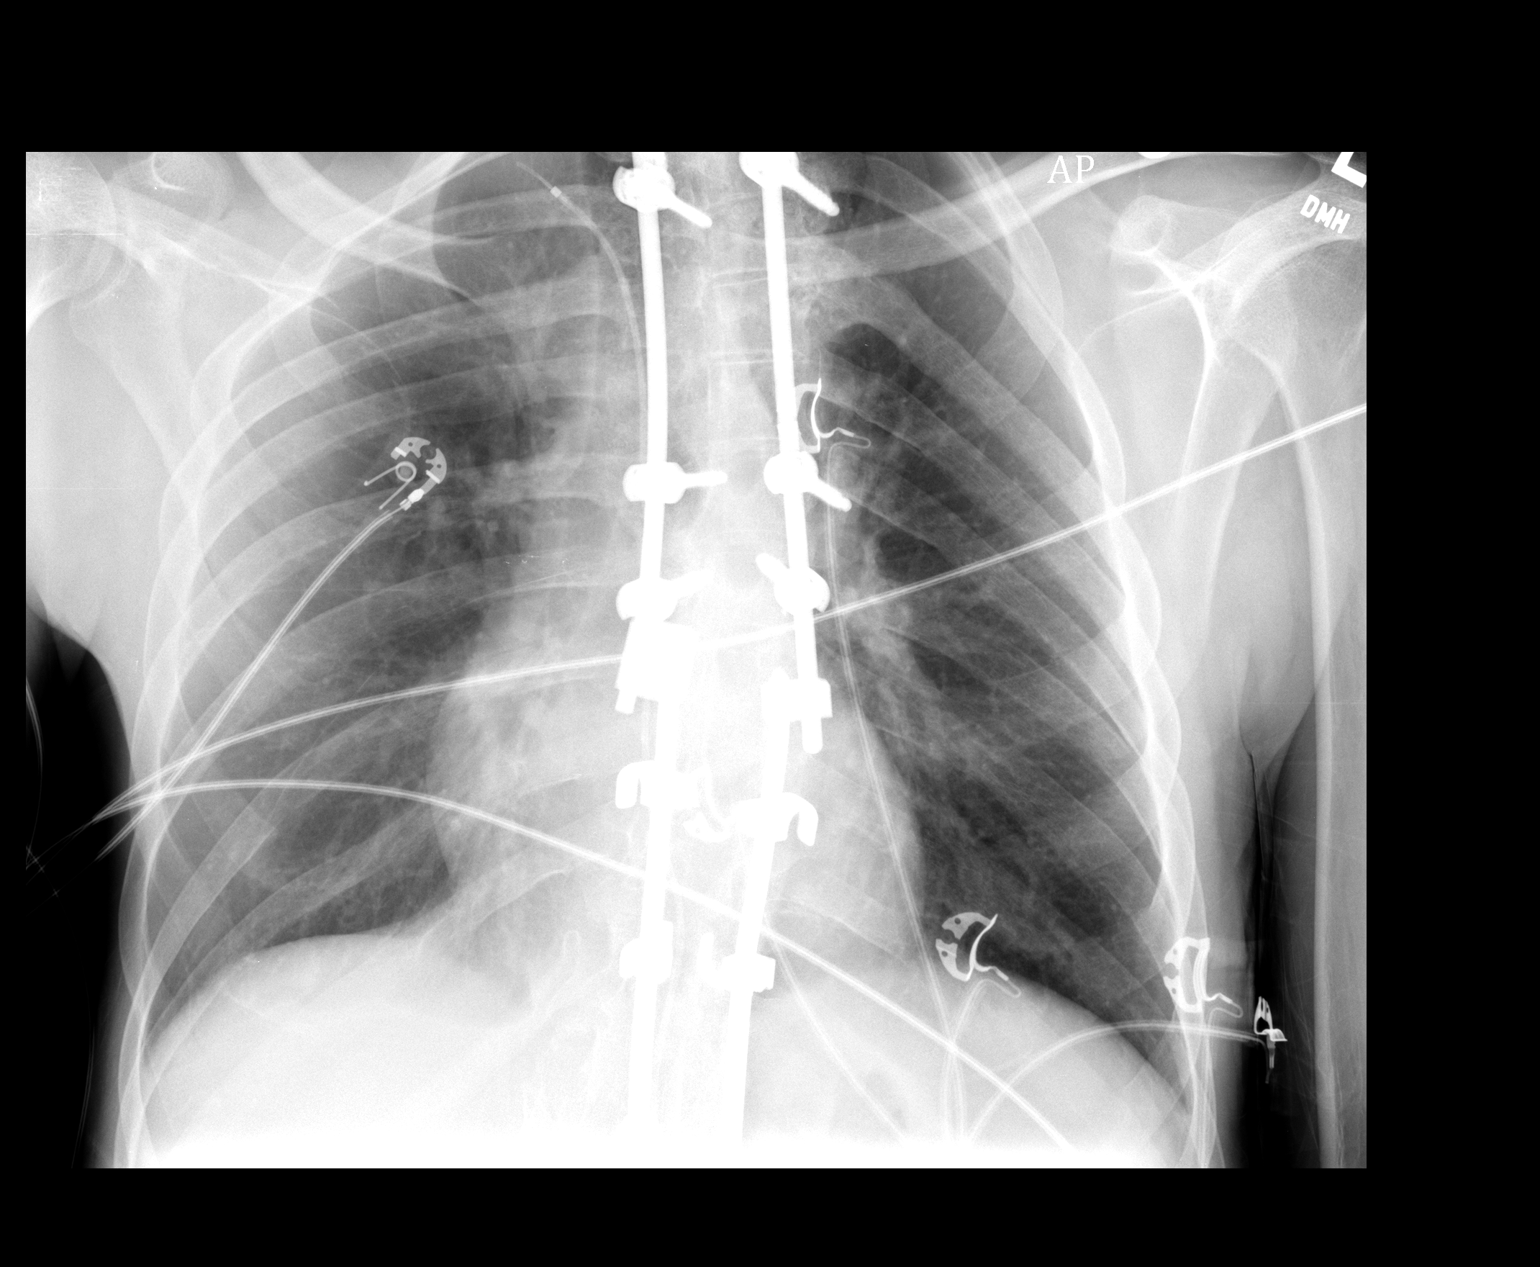

[view not recorded (2 of 2)]
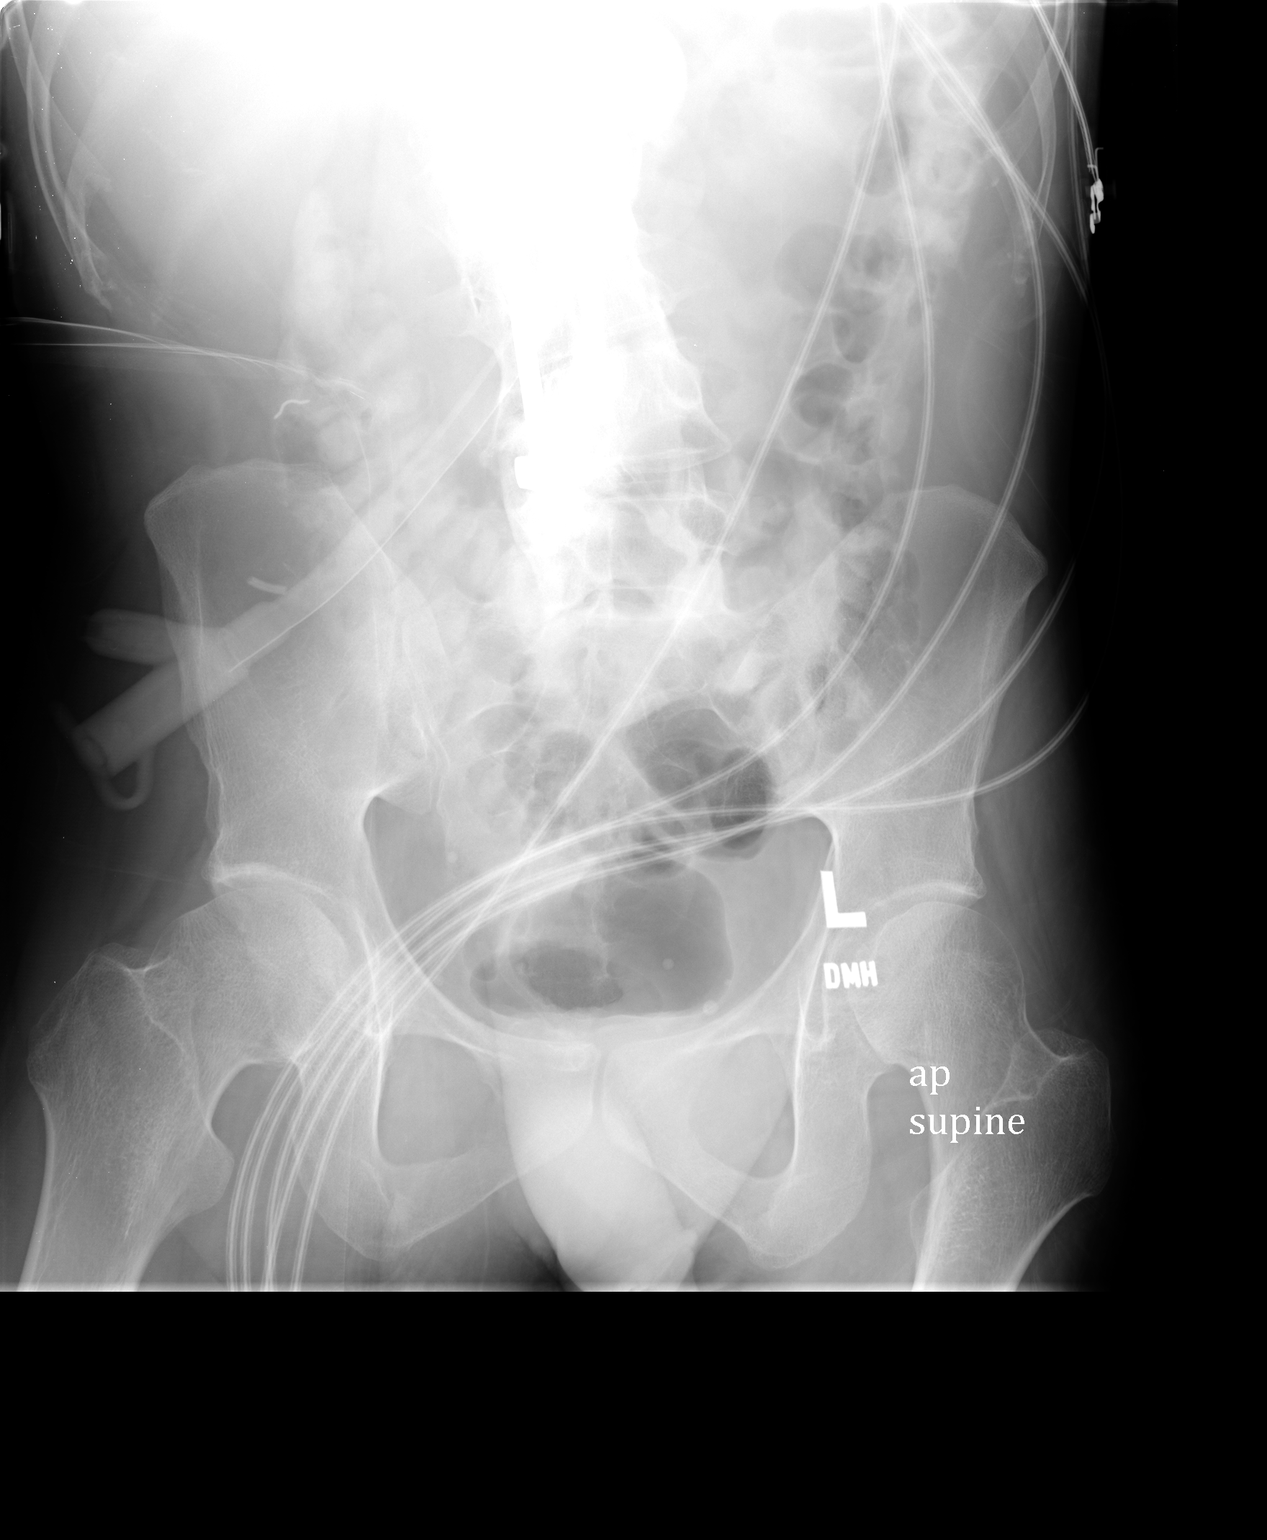

[2 of 2 positions shown; findings below may reference images not displayed]

FINDINGS: Bowel gas pattern does not show evidence of ileus, obstruction or
free air. Contrast is present in the colon related to the previous
CT. Gastrostomy tube is in place grossly unchanged. No worrisome
calcifications or acute bony findings. Previous scoliosis surgery
with rod placement.

One-view chest shows unremarkable heart and mediastinal shadows.
Disconnected lead again in place.
IMPRESSION: No acute finding to explain abdominal pain. Previously administered
contrast has passed throughout the colon. Unremarkable bowel
appearance. A gastrostomy tube appears as expected.

## 2014-07-11 ENCOUNTER — Ambulatory Visit: Payer: Medicare Other | Admitting: Dietician

## 2014-07-16 ENCOUNTER — Ambulatory Visit (INDEPENDENT_AMBULATORY_CARE_PROVIDER_SITE_OTHER): Payer: Medicare Other | Admitting: Family Medicine

## 2014-07-16 ENCOUNTER — Encounter: Payer: Self-pay | Admitting: Family Medicine

## 2014-07-16 VITALS — BP 112/70 | HR 82 | Temp 98.5°F | Ht 62.0 in

## 2014-07-16 DIAGNOSIS — F341 Dysthymic disorder: Secondary | ICD-10-CM

## 2014-07-16 DIAGNOSIS — R000 Tachycardia, unspecified: Secondary | ICD-10-CM

## 2014-07-16 DIAGNOSIS — L738 Other specified follicular disorders: Secondary | ICD-10-CM

## 2014-07-16 DIAGNOSIS — IMO0002 Reserved for concepts with insufficient information to code with codable children: Secondary | ICD-10-CM | POA: Diagnosis not present

## 2014-07-16 DIAGNOSIS — F4322 Adjustment disorder with anxiety: Secondary | ICD-10-CM

## 2014-07-16 DIAGNOSIS — R002 Palpitations: Secondary | ICD-10-CM

## 2014-07-16 DIAGNOSIS — J209 Acute bronchitis, unspecified: Secondary | ICD-10-CM

## 2014-07-16 DIAGNOSIS — R451 Restlessness and agitation: Secondary | ICD-10-CM

## 2014-07-16 DIAGNOSIS — Z23 Encounter for immunization: Secondary | ICD-10-CM | POA: Diagnosis not present

## 2014-07-16 DIAGNOSIS — R1310 Dysphagia, unspecified: Secondary | ICD-10-CM

## 2014-07-16 DIAGNOSIS — L739 Follicular disorder, unspecified: Secondary | ICD-10-CM

## 2014-07-16 DIAGNOSIS — F418 Other specified anxiety disorders: Secondary | ICD-10-CM

## 2014-07-16 DIAGNOSIS — K9420 Gastrostomy complication, unspecified: Secondary | ICD-10-CM

## 2014-07-16 MED ORDER — AMBULATORY NON FORMULARY MEDICATION: Status: DC

## 2014-07-16 MED ORDER — OLANZAPINE 5 MG PO TABS: 2.5000 mg | ORAL_TABLET | Freq: Two times a day (BID) | ORAL | Status: DC | PRN

## 2014-07-16 MED ORDER — CEFDINIR 250 MG/5ML PO SUSR: 250.0000 mg | Freq: Two times a day (BID) | ORAL | Status: DC

## 2014-07-16 MED ORDER — DIAZEPAM 5 MG PO TABS: 5.0000 mg | ORAL_TABLET | Freq: Every evening | ORAL | Status: DC | PRN

## 2014-07-16 MED ORDER — METOPROLOL TARTRATE 25 MG PO TABS: 25.0000 mg | ORAL_TABLET | Freq: Two times a day (BID) | ORAL | Status: DC | PRN

## 2014-07-16 NOTE — Progress Notes (Signed)
Pre visit review using our clinic review tool, if applicable. No additional management support is needed unless otherwise documented below in the visit note. 

## 2014-07-16 NOTE — Progress Notes (Signed)
Patient ID: [name], male   DOB: 04-29-1986, 28 y.o.   MRN: [id number] [name] [id number] 09-23-1986 07/16/2014      Progress Note-Follow Up  Subjective  Chief Complaint  Chief Complaint  Patient presents with  . Follow-up  . Injections    flu    HPI  Patient is a 28 year old male in today for routine medical care. He is in today with his mother. His bronchitis has improved. He is eating better and sleeping better. He is less agitated. They've been able to drop the olanzapine back down to bedtime and stop Toprol for now she gets good relief from his pulmonary test and mom does feel that his secretions. There's been no other acute concerns although he does continue to eat less by mouth after each flare.  Past Medical History  Diagnosis Date  . Cerebral palsy   . GERD (gastroesophageal reflux disease)   . Anxiety   . Depression   . Thyroid disease     hyper  . Incontinence of feces   . Palpitations   . Esophagitis   . Dehydration 11/22/2013    Past Surgical History  Procedure Laterality Date  . Spine surgery  ,11/20/2010, 2011  . Eye surgery    . Ears tubes    . Hamstring released    . Baclofen trial    . Baslofen pump implant    . Spinal fusion    . G-tube insert  August 2006  . Spinal fusioncorrect 106 degree kyphosis    . Spinal fusion to correct 70 degree kyphosis    . Tonsillectomy    . Peg placement  10/21/2011    Procedure: PERCUTANEOUS ENDOSCOPIC GASTROSTOMY (PEG) REPLACEMENT;  Surgeon: [name], MD;  Location: [facility];  Service: Endoscopy;  Laterality: N/A;  . Peg placement N/A 06/13/2013    Procedure: PERCUTANEOUS ENDOSCOPIC GASTROSTOMY (PEG) REPLACEMENT;  Surgeon: [name], MD;  Location: [facility];  Service: Endoscopy;  Laterality: N/A;    Family History  Problem Relation Age of Onset  . Asthma Mother   . Hyperlipidemia Mother   . COPD Mother   . Other Mother     bronchial stasis/ABPA  . Cancer Maternal Grandmother 27     breast  . Hyperlipidemia Maternal Grandmother   . Hypertension Maternal Grandmother   . Cancer Maternal Grandfather     prostate  . Heart disease Paternal Grandfather     CHF  . Osteoporosis Paternal Grandmother   . Arthritis Paternal Grandmother     rheumatoid    History   Social History  . Marital Status: Single    Spouse Name: N/A    Number of Children: 0  . Years of Education: N/A   Occupational History  . disbaled    Social History Main Topics  . Smoking status: Never Smoker   . Smokeless tobacco: Never Used  . Alcohol Use: No  . Drug Use: No  . Sexual Activity: No   Other Topics Concern  . Not on file   Social History Narrative  . No narrative on file    Current Outpatient Prescriptions on File Prior to Visit  Medication Sig Dispense Refill  . AMBULATORY NON FORMULARY MEDICATION Medication Name: MIC gastrostomy/bolus feeding tube 24 French Part number 0110-24. #2 and 10 cc lurer lock syringe #2 Dx:  2 Device  1  . clotrimazole-betamethasone (LOTRISONE) cream Apply 1 application topically 2 (two) times daily.  45 g  1  .  dantrolene (DANTRIUM) 50 MG capsule Take 1 capsule (50 mg total) by mouth 4 (four) times daily. 1 tab bid and 2 tab at qhs  120 capsule  4  . divalproex (DEPAKOTE SPRINKLE) 125 MG capsule Take 2 capsules in morning, 5 capsules at bedtime  210 capsule  4  . Feeding Tubes - Bags (FLEXIFLO FEEDING BAG/PUMP) MISC 1 Units by Does not apply route continuous.  1 each  30  . Feeding Tubes - Pump MISC 1 Units by Does not apply route continuous.  1 each  0  . Feeding Tubes - Sets (KANGAROO EPUMP SET 1000ML) MISC 30 day supply of Kangaroo Joey PUmp set with flush bag  1 each  5  . LORazepam (ATIVAN) 1 MG tablet Take 1 tab po tid prn for anxiety  90 tablet  1  . Nutritional Supplements (FEEDING SUPPLEMENT, OSMOLITE 1.2 CAL,) LIQD Place 1,000 mLs into feeding tube daily.  1000 mL  30  . nystatin cream (MYCOSTATIN) Apply 1 application topically 2 (two)  times daily as needed for dry skin.  30 g  1  . oxybutynin (DITROPAN) 5 MG/5ML syrup       . PARoxetine (PAXIL) 30 MG tablet tape down per neuro phyciatrist- 15 mg      . Probiotic Product (ADVANCED PROBIOTIC 10) CAPS 2 tablespoons      . sucralfate (CARAFATE) 1 G tablet 1 tablet twice daily via PEG  60 tablet  1  . cefdinir (OMNICEF) 250 MG/5ML suspension Take 5 mLs (250 mg total) by mouth 2 (two) times daily. 6cc down peg tube bid X 10 days  60 mL  0   No current facility-administered medications on file prior to visit.    Allergies  Allergen Reactions  . Ambien [Zolpidem Tartrate] Nausea Only  . Codeine   . Baclofen Anxiety  . Diazepam Anxiety  . Sulfonamide Derivatives Rash    Review of Systems  Review of Systems  Constitutional: Negative for fever and malaise/fatigue.  HENT: Negative for congestion.   Eyes: Negative for discharge.  Respiratory: Negative for shortness of breath.   Cardiovascular: Negative for chest pain, palpitations and leg swelling.  Gastrointestinal: Negative for nausea, abdominal pain and diarrhea.  Genitourinary: Negative for dysuria.  Musculoskeletal: Negative for falls.  Skin: Negative for rash.  Neurological: Negative for loss of consciousness and headaches.  Endo/Heme/Allergies: Negative for polydipsia.  Psychiatric/Behavioral: Negative for depression and suicidal ideas. The patient is not nervous/anxious and does not have insomnia.     Objective  BP 112/70  Pulse 82  Temp(Src) 98.5 F (36.9 C) (Axillary)  Ht 5\' 2"  (1.575 m)  Physical Exam  Physical Exam  Constitutional: He is oriented to person, place, and time and well-developed, well-nourished, and in no distress. No distress.  HENT:  Head: Normocephalic and atraumatic.  Eyes: Conjunctivae are normal.  Neck: Neck supple. No thyromegaly present.  Cardiovascular: Normal rate, regular rhythm and normal heart sounds.   No murmur heard. Pulmonary/Chest: Effort normal and breath sounds  normal. No respiratory distress.  Abdominal: He exhibits no distension and no mass. There is no tenderness.  Musculoskeletal: He exhibits no edema.  Neurological: He is alert and oriented to person, place, and time.  Skin: Skin is warm.  Psychiatric: Memory, affect and judgment normal.    Lab Results  Component Value Date   TSH 0.774 06/08/2014   Lab Results  Component Value Date   WBC 5.8 05/17/2014   HGB 14.3 05/17/2014   HCT 43.5 05/17/2014  MCV 87.2 05/17/2014   PLT 249 05/17/2014   Lab Results  Component Value Date   CREATININE 0.40* 05/17/2014   BUN 8 05/17/2014   NA 141 05/17/2014   K 4.1 05/17/2014   CL 99 05/17/2014   CO2 26 05/17/2014   Lab Results  Component Value Date   ALT 8 05/17/2014   AST 13 05/17/2014   ALKPHOS 65 05/17/2014   BILITOT 0.3 05/17/2014   Lab Results  Component Value Date   CHOL 151 04/25/2010   Lab Results  Component Value Date   HDL 29.90* 04/25/2010   Lab Results  Component Value Date   LDLCALC 96 04/25/2010   Lab Results  Component Value Date   TRIG 125.0 04/25/2010   Lab Results  Component Value Date   CHOLHDL 5 04/25/2010     Assessment & Plan  PALPITATIONS tachycardia resolved, has stopped Metoprolol for now may use prn when he is sick or struggling  GASTROSTOMY COMPLICATION Given refill on G tubes so mom can replace as needed  DYSPHAGIA UNSPECIFIED Is eating less po but does continue to eat each day to some degree, Mom supplements with tube feeds but is having trouble getting insurance to pay for supplements since he is not strictly NPO  Depression with anxiety Improved as illness has improved. Is down to 5 mg of Olanzapine at bedtime again can add back 2.5 bid as needed. Follows with psychiatry  Acute bronchitis Improved. Is doing well with vest.

## 2014-07-17 ENCOUNTER — Encounter: Payer: Self-pay | Admitting: Family Medicine

## 2014-07-17 NOTE — Assessment & Plan Note (Signed)
Improved as illness has improved. Is down to 5 mg of Olanzapine at bedtime again can add back 2.5 bid as needed. Follows with psychiatry

## 2014-07-17 NOTE — Assessment & Plan Note (Signed)
tachycardia resolved, has stopped Metoprolol for now may use prn when he is sick or struggling

## 2014-07-17 NOTE — Assessment & Plan Note (Signed)
Improved. Is doing well with vest.

## 2014-07-17 NOTE — Assessment & Plan Note (Signed)
Is eating less po but does continue to eat each day to some degree, Mom supplements with tube feeds but is having trouble getting insurance to pay for supplements since he is not strictly NPO

## 2014-07-17 NOTE — Assessment & Plan Note (Signed)
Given refill on G tubes so mom can replace as needed

## 2014-07-20 ENCOUNTER — Telehealth: Payer: Self-pay

## 2014-07-20 DIAGNOSIS — R451 Restlessness and agitation: Secondary | ICD-10-CM

## 2014-07-20 MED ORDER — DIAZEPAM 5 MG PO TABS: 5.0000 mg | ORAL_TABLET | Freq: Every evening | ORAL | Status: DC | PRN

## 2014-07-20 NOTE — Telephone Encounter (Signed)
Is patient suppose to be taking Diazepam 2 mg or 5 mg tablets?

## 2014-07-20 NOTE — Telephone Encounter (Signed)
I thought he had weaned off the valium entirely!!!

## 2014-07-24 ENCOUNTER — Ambulatory Visit (HOSPITAL_BASED_OUTPATIENT_CLINIC_OR_DEPARTMENT_OTHER)
Admission: RE | Admit: 2014-07-24 | Discharge: 2014-07-24 | Disposition: A | Discharge status: Home / Self Care | Payer: Medicare Other | Source: Ambulatory Visit | Attending: Internal Medicine | Admitting: Internal Medicine

## 2014-07-24 ENCOUNTER — Encounter: Payer: Self-pay | Admitting: Internal Medicine

## 2014-07-24 ENCOUNTER — Ambulatory Visit (INDEPENDENT_AMBULATORY_CARE_PROVIDER_SITE_OTHER): Payer: Medicare Other | Admitting: Internal Medicine

## 2014-07-24 VITALS — BP 102/58 | HR 65 | Temp 96.4°F | Wt 91.5 lb

## 2014-07-24 DIAGNOSIS — M25552 Pain in left hip: Secondary | ICD-10-CM

## 2014-07-24 DIAGNOSIS — R634 Abnormal weight loss: Secondary | ICD-10-CM

## 2014-07-24 DIAGNOSIS — M25559 Pain in unspecified hip: Secondary | ICD-10-CM

## 2014-07-24 DIAGNOSIS — R05 Cough: Secondary | ICD-10-CM

## 2014-07-24 DIAGNOSIS — R059 Cough, unspecified: Secondary | ICD-10-CM

## 2014-07-24 NOTE — Patient Instructions (Signed)
  Stop by the first floor and get the XR

## 2014-07-24 NOTE — Progress Notes (Signed)
Pre visit review using our clinic review tool, if applicable. No additional management support is needed unless otherwise documented below in the visit note. 

## 2014-07-24 NOTE — Progress Notes (Signed)
Subjective:    Patient ID: [name], male    DOB: Aug 29, 1986, 28 y.o.   MRN: [id number]  DOS:  07/24/2014 Type of visit - description : acute, ere w/other and care giver  Interval history: Started to complain of left hip pain 5 days ago when they were doing passive range of motion. Denies any injury or fall. The family has not noticed any redness or swelling. They also concerned about weight loss, weight is reviewed, has lost 4 pounds in the last 2 months however at some point last year he weighed 120 pounds.   ROS No fever or chills. he continue with cough with chest congestion on and off, mother somehow concerned about it  Past Medical History  Diagnosis Date  . Cerebral palsy   . GERD (gastroesophageal reflux disease)   . Anxiety   . Depression   . Thyroid disease     hyper  . Incontinence of feces   . Palpitations   . Esophagitis   . Dehydration 11/22/2013  . Depression with anxiety 08/01/2010    Qualifier: Diagnosis of  By: Nelson-Smith CMA (AAMA), Dottie      Past Surgical History  Procedure Laterality Date  . Spine surgery  ,11/20/2010, 2011  . Eye surgery    . Ears tubes    . Hamstring released    . Baclofen trial    . Baslofen pump implant    . Spinal fusion    . G-tube insert  August 2006  . Spinal fusioncorrect 106 degree kyphosis    . Spinal fusion to correct 70 degree kyphosis  11-2010  . Tonsillectomy    . Peg placement  10/21/2011    Procedure: PERCUTANEOUS ENDOSCOPIC GASTROSTOMY (PEG) REPLACEMENT;  Surgeon: [name], MD;  Location: [facility];  Service: Endoscopy;  Laterality: N/A;  . Peg placement N/A 06/13/2013    Procedure: PERCUTANEOUS ENDOSCOPIC GASTROSTOMY (PEG) REPLACEMENT;  Surgeon: [name], MD;  Location: [facility];  Service: Endoscopy;  Laterality: N/A;  . Hip surgery      x2 , side     History   Social History  . Marital Status: Single    Spouse Name: N/A    Number of Children: 0  . Years of Education: N/A    Occupational History  . disbaled    Social History Main Topics  . Smoking status: Never Smoker   . Smokeless tobacco: Never Used  . Alcohol Use: No  . Drug Use: No  . Sexual Activity: No   Other Topics Concern  . Not on file   Social History Narrative  . No narrative on file        Medication List       This list is accurate as of: 07/24/14 11:59 PM.  Always use your most recent med list.               ADVANCED PROBIOTIC 10 Caps  2 tablespoons     AMBULATORY NON FORMULARY MEDICATION  - Medication Name: MIC gastrostomy/bolus feeding tube 24 French Part number 0110-24. #2 and  - 10 cc lurer lock syringe #2  - Dx:     cefdinir 250 MG/5ML suspension  Commonly known as:  OMNICEF  Take 5 mLs (250 mg total) by mouth 2 (two) times daily. 6cc down peg tube bid X 10 days     clotrimazole-betamethasone cream  Commonly known as:  LOTRISONE  Apply 1 application topically 2 (two) times daily.  dantrolene 50 MG capsule  Commonly known as:  DANTRIUM  Take 1 capsule (50 mg total) by mouth 4 (four) times daily. 1 tab bid and 2 tab at qhs     diazepam 5 MG tablet  Commonly known as:  VALIUM  Take 1 tablet (5 mg total) by mouth at bedtime as needed for anxiety or sedation.     divalproex 125 MG capsule  Commonly known as:  DEPAKOTE SPRINKLE  Take 2 capsules in morning, 5 capsules at bedtime     feeding supplement (OSMOLITE 1.2 CAL) Liqd  Place 1,000 mLs into feeding tube daily.     Feeding Tubes - Pump Misc  1 Units by Does not apply route continuous.     FLEXIFLO FEEDING BAG/PUMP Misc  1 Units by Does not apply route continuous.     KANGAROO EPUMP SET 1000ML Misc  30 day supply of Kangaroo Joey PUmp set with flush bag     LORazepam 1 MG tablet  Commonly known as:  ATIVAN  Take 1 tab po tid prn for anxiety     metoprolol tartrate 25 MG tablet  Commonly known as:  LOPRESSOR  Take 1 tablet (25 mg total) by mouth 2 (two) times daily as needed. Palpitaitons,  tachycardia     nystatin cream  Commonly known as:  MYCOSTATIN  Apply 1 application topically 2 (two) times daily as needed for dry skin.     OLANZapine 5 MG tablet  Commonly known as:  ZYPREXA  Take 5 mg by mouth at bedtime.     OLANZapine 5 MG tablet  Commonly known as:  ZYPREXA  Take 0.5 tablets (2.5 mg total) by mouth 2 (two) times daily as needed (agitation).     Omeprazole-Sodium Bicarbonate 20-1100 MG Caps capsule  Commonly known as:  ZEGERID  Take 1 capsule by mouth daily before breakfast.     oxybutynin 5 MG/5ML syrup  Commonly known as:  DITROPAN     PARoxetine 30 MG tablet  Commonly known as:  PAXIL  tape down per neuro phyciatrist- 15 mg     PRESCRIPTION MEDICATION  G-tube     sucralfate 1 G tablet  Commonly known as:  CARAFATE  1 tablet twice daily via PEG           Objective:   Physical Exam BP 102/58  Pulse 65  Temp(Src) 96.4 F (35.8 C) (Axillary)  Wt 91 lb 8 oz (41.504 kg) General -- very thin young man in no acute distress  Lungs --Poor inspiratory effort, no obvious crackles. No wheezing Heart-- normal rate, regular rhythm, no murmur.   Extremities-- Left hip range of motion decrease, on palpation there is no swelling, redness.     Assessment & Plan:    Hip pain, left side. Start  assessment with a x-ray, depending on results he may need to see his rehab  medicine physician Dr. Tessa Lerner  Weight loss, Labs within normal, specifically TSH normal. He was already referred to a nutritionist but needs somebody who knows about PEG feedings, will call the nutritionist department   Cough, Continue with a mild cough and chest congestion. Will get a chest x-ray, he will see his pulmonologist next   Today , I spent more than 25 min with the patient and his mother : >50% of the time counseling regards  nutrition, reviewing the chart and coordinating his care

## 2014-07-25 ENCOUNTER — Telehealth: Payer: Self-pay | Admitting: Internal Medicine

## 2014-07-25 DIAGNOSIS — G809 Cerebral palsy, unspecified: Secondary | ICD-10-CM

## 2014-07-25 DIAGNOSIS — R633 Feeding difficulties, unspecified: Secondary | ICD-10-CM

## 2014-07-25 NOTE — Telephone Encounter (Signed)
Order was placed by [facility] but she cancelled it

## 2014-07-26 NOTE — Telephone Encounter (Signed)
i spoke w/ the nutritionist at the hospital, they [facility], they have dieticians  that work with patient's who have PEGs in place. Please call [place] or [facility] and ask for a dietician referral Let pt's mother know about the plan

## 2014-07-26 NOTE — Telephone Encounter (Signed)
Informed Pts mother that we have sent dietary referral to [facility] for intake.

## 2014-07-26 NOTE — Telephone Encounter (Signed)
Referral faxed to [facility]. [phone].

## 2014-07-30 ENCOUNTER — Other Ambulatory Visit: Payer: Self-pay

## 2014-07-30 ENCOUNTER — Telehealth: Payer: Self-pay

## 2014-07-30 ENCOUNTER — Telehealth: Payer: Self-pay | Admitting: *Deleted

## 2014-07-30 ENCOUNTER — Encounter: Payer: Self-pay | Admitting: Physical Medicine & Rehabilitation

## 2014-07-30 ENCOUNTER — Encounter: Payer: Self-pay | Admitting: Internal Medicine

## 2014-07-30 DIAGNOSIS — R633 Feeding difficulties, unspecified: Secondary | ICD-10-CM

## 2014-07-30 DIAGNOSIS — G809 Cerebral palsy, unspecified: Secondary | ICD-10-CM

## 2014-07-30 MED ORDER — BARD LEG BAG STRAPS/FABRIC MISC: Status: DC

## 2014-07-30 MED ORDER — PROTECTIVE BARRIER WIPES MISC: Status: DC

## 2014-07-30 MED ORDER — PREVAIL BREEZERS MEDIUM MISC: Status: DC

## 2014-07-30 NOTE — Telephone Encounter (Signed)
Message from email sent to [place]:  RE: Botox injections [name] has been getting in shoulder/neck area this past year. As we discussed last visit, [name]'s dysphagia has worsened this last year since starting the Botox injections and so the last round of Botox you only gave him shots in the Trap muscles and avoided the neck area. Since [name]'s dysphagia continues to worsen and he has a gurgly cough all the time, we want to stop Botox in this area and see if over time the dysphagia improves. He has gone from 115 lbs when starting Botox a year ago and is now down to 92 lbs. There is no pneumonia, bronchitis, fluid in lungs or around heart that would be reason for the gurgliness, so I assume this is dysphagia related? [name] choked bad on a light whipped yogurt on Fri. so nutrition only by g-tube right now. Referrals put in for home nurse, nutrition, SLT to evaluate and advise on [name]'s nutrition.Thank you, Marya Fossa

## 2014-07-30 NOTE — Telephone Encounter (Signed)
RX's sent per pts mothers request

## 2014-07-30 NOTE — Telephone Encounter (Signed)
botox in his traps would not likely have any effect on his swallowing mechanism.  i can understand her concern however, and i'm fine with holding off on further injections.  However, i would look at other causes for the dysphagia---?imaging of brain. SLP should repeat a swallowing study also

## 2014-08-01 ENCOUNTER — Encounter: Payer: Self-pay | Admitting: Pulmonary Disease

## 2014-08-01 ENCOUNTER — Ambulatory Visit (INDEPENDENT_AMBULATORY_CARE_PROVIDER_SITE_OTHER): Payer: Medicare Other | Admitting: Pulmonary Disease

## 2014-08-01 ENCOUNTER — Telehealth: Payer: Self-pay | Admitting: Internal Medicine

## 2014-08-01 VITALS — BP 112/70 | HR 90 | Temp 97.0°F | Ht 62.0 in | Wt 92.0 lb

## 2014-08-01 DIAGNOSIS — J690 Pneumonitis due to inhalation of food and vomit: Secondary | ICD-10-CM

## 2014-08-01 DIAGNOSIS — E43 Unspecified severe protein-calorie malnutrition: Secondary | ICD-10-CM | POA: Diagnosis not present

## 2014-08-01 NOTE — Telephone Encounter (Signed)
Caller name:Melissa  Relation to pt: [facility]  Call back number: [phone]   Reason for call:   Melissa from [facility] stated they have not service patient for over a year and the RX would have to be sent to  [facility] or from where he has been getting he's ostomy supplies from.

## 2014-08-01 NOTE — Patient Instructions (Signed)
Nothing by mouth x 1 month PEG feeds only If cough no better in 2 weeks, call back for Rx for RObinul to dry up secretions

## 2014-08-01 NOTE — Telephone Encounter (Signed)
Response sent via [employer] where message originated.

## 2014-08-01 NOTE — Progress Notes (Signed)
   Subjective:    Patient ID: [name], male    DOB: 06/29/1986, 28 y.o.   MRN: [id number]  HPI  28 year old male.- accompanied by mother . He has cerebral palsy and needs assistance for all activities of daily living, baseline wheelchair-bound. Underwent T9-C6 spinal fusion at [facility] in Sept 2011.  He had a PEG placed in 2012 for hydration.  He was hospitalized in 08/2013 for aspiration pneumonia,Swallow evaluation showed severe dysphagia with high risk of aspirating. He has 4 caregivers at home.  he is maintained on a dysphagia 3 diet.  Saw neuro psych - paxil decreased , added zyprexa   08/01/2014  Chief Complaint  Patient presents with  . Follow-up    Pt has wet cough with white mucus. Mother states this wet cough has not resolved since visit with RA on 06/01/14. Pt currently getting botox injections in shoulders x 1 year, mother questions if this could be contributing to pts cough and swallowing issues. Pt has only had tube feeds today.    77m FU  Continues to have 'wet ' cough, occasional white sputum but mostly swallows,  ongoing congestion & inability to clear secretions  Accompanied by mother  Remains on D3 diet w/  bolus feeds via peg. However he continues to lose weight, is down to 95 pounds -was unable to see nutrition consult - 'they do not evaluate for Tfs' per mom Chest x-ray on 9/22  did not show acute infiltrates  Obtained vest -compliant Completed cefdinir for leg cellulitis  Review of Systems neg for any significant sore throat, dysphagia, itching, sneezing, nasal congestion or excess/ purulent secretions, fever, chills, sweats, unintended wt loss, pleuritic or exertional cp, hempoptysis, orthopnea pnd or change in chronic leg swelling. Also denies presyncope, palpitations, heartburn, abdominal pain, nausea, vomiting, diarrhea or change in bowel or urinary habits, dysuria,hematuria, rash, arthralgias, visual complaints, headache, numbness weakness or  ataxia.     Objective:   Physical Exam  Gen. Pleasant, thin, in no distress, in wheelchair, communicates with machine & via mom ENT - no lesions, no post nasal drip Neck: No JVD, no thyromegaly, no carotid bruits Lungs: no use of accessory muscles, no dullness to percussion, clear without rales or rhonchi  Cardiovascular: Rhythm regular, heart sounds  normal, no murmurs or gallops, no peripheral edema Musculoskeletal: No deformities, no cyanosis or clubbing        Assessment & Plan:

## 2014-08-02 NOTE — Assessment & Plan Note (Signed)
Does need nutrition consult & increase calorie intake vs add protein to current formula

## 2014-08-02 NOTE — Telephone Encounter (Signed)
Please advise pts mother of this and see if she would like them sent to [facility]?  We will need the fax number if so?

## 2014-08-02 NOTE — Assessment & Plan Note (Signed)
I do feel that [name] is aspirating Nothing by mouth x 1 month PEG feeds only If cough no better in 2 weeks, call back for Rx for RObinul to dry up secretions

## 2014-08-03 ENCOUNTER — Encounter: Payer: Self-pay | Admitting: Family Medicine

## 2014-08-03 ENCOUNTER — Telehealth: Payer: Self-pay | Admitting: Internal Medicine

## 2014-08-03 ENCOUNTER — Telehealth: Payer: Self-pay | Admitting: Family Medicine

## 2014-08-03 NOTE — Telephone Encounter (Signed)
Paperwork faxed to [facility]  Please advise about the rX for formula

## 2014-08-03 NOTE — Telephone Encounter (Signed)
Dr. Olevia Perches do you approve of switch to Dr. Hilarie Fredrickson

## 2014-08-03 NOTE — Telephone Encounter (Signed)
Please call Crystal at [facility]. P: [phone]  [facility] does not supply the ostomy supplies

## 2014-08-03 NOTE — Telephone Encounter (Signed)
Dr. Pyrtle will you accept 

## 2014-08-03 NOTE — Telephone Encounter (Signed)
Need orders for nursing and speech, please fax [phone]

## 2014-08-03 NOTE — Telephone Encounter (Signed)
Patient mom states that [facility] uses [facility]. Patient mom does not know fax but phone # [phone]  Patient mom states that Dr. Elsworth Soho told her to not feed patient by mouth for a month. Patient is now on formula as primary source of nutrition and would like to know if Dr. Charlett Blake would write an rx for formula stating that patient is using formula as primary source of nutrition, so insurance will pay for this. Patient is using [facility] for this. P: [phone] F: [phone]

## 2014-08-03 NOTE — Telephone Encounter (Signed)
Please inform crystal that they will need to notify pts mother. This is where the mom wanted it to go

## 2014-08-03 NOTE — Telephone Encounter (Signed)
OK to transfer 

## 2014-08-03 NOTE — Telephone Encounter (Signed)
Informed Crystal of this and she states that she will call patients mother.

## 2014-08-03 NOTE — Telephone Encounter (Signed)
Spoke with mother she advised please use [facility] fax # [phone]

## 2014-08-03 NOTE — Telephone Encounter (Signed)
Ok, what does he need to be seen for at present?

## 2014-08-03 NOTE — Telephone Encounter (Signed)
Establish care with Dr. Hilarie Fredrickson only.  He will come on 10/09/14

## 2014-08-05 ENCOUNTER — Other Ambulatory Visit: Payer: Self-pay | Admitting: Family Medicine

## 2014-08-05 DIAGNOSIS — G809 Cerebral palsy, unspecified: Secondary | ICD-10-CM

## 2014-08-05 NOTE — Telephone Encounter (Signed)
I have written the orders. Please fax as corrected

## 2014-08-06 NOTE — Telephone Encounter (Deleted)
Hi, we have tried 4 times to fax the orders and each time, the fax has failed. Is [phone] the correct fax number?  Thanks, Lamount Cohen, CMA

## 2014-08-06 NOTE — Telephone Encounter (Signed)
I don't believe the order was wrote? It looks like an order was trying to be placed but never completed?

## 2014-08-06 NOTE — Telephone Encounter (Signed)
It is there o10/4 it is the home health order and within that is the speech and nursing order

## 2014-08-07 NOTE — Telephone Encounter (Signed)
I found this under the referral so Marj has already done this and here is the response:  - Message ----- From: Jiles Crocker Sent: 08/06/2014 10:39 AM To: Elveria Royals Subject: RE: home health referral After our processing dept had a chance to look at this referral, we are unable to take this one at this time due to staffing in our [place] area. Thank you for thinking of [facility]. We are sorry that we cannot take the referral at this time. Thanks! ----- Message ----- From: Elveria Royals Sent: 08/06/2014 7:18 AM To: Darlina Guys Subject: home health referral

## 2014-08-07 NOTE — Telephone Encounter (Signed)
OK so we need to tell mom and see who else she wants to use or pick a different home health agency if she does not have a preference and call around to see who has openings

## 2014-08-11 ENCOUNTER — Encounter: Payer: Self-pay | Admitting: Family Medicine

## 2014-08-15 NOTE — Telephone Encounter (Signed)
Scripts refaxed and confirmation received. JG//CMA

## 2014-08-20 DIAGNOSIS — G809 Cerebral palsy, unspecified: Secondary | ICD-10-CM | POA: Diagnosis not present

## 2014-08-20 DIAGNOSIS — R1312 Dysphagia, oropharyngeal phase: Secondary | ICD-10-CM | POA: Diagnosis not present

## 2014-08-20 DIAGNOSIS — J984 Other disorders of lung: Secondary | ICD-10-CM | POA: Diagnosis not present

## 2014-08-20 DIAGNOSIS — E43 Unspecified severe protein-calorie malnutrition: Secondary | ICD-10-CM | POA: Diagnosis not present

## 2014-08-20 DIAGNOSIS — Z431 Encounter for attention to gastrostomy: Secondary | ICD-10-CM | POA: Diagnosis not present

## 2014-08-20 DIAGNOSIS — M419 Scoliosis, unspecified: Secondary | ICD-10-CM | POA: Diagnosis not present

## 2014-08-20 MED ORDER — OSMOLITE 1.2 CAL PO LIQD: ORAL | Status: DC

## 2014-08-20 NOTE — Addendum Note (Signed)
Addended by: Varney Daily on: 08/20/2014 10:24 AM   Modules accepted: Orders

## 2014-08-20 NOTE — Telephone Encounter (Signed)
RX printed for md to sign and fax to [facility]

## 2014-08-20 NOTE — Addendum Note (Signed)
Addended by: Varney Daily on: 08/20/2014 01:16 PM   Modules accepted: Orders

## 2014-08-21 ENCOUNTER — Telehealth: Payer: Self-pay | Admitting: Family Medicine

## 2014-08-21 ENCOUNTER — Telehealth: Payer: Self-pay

## 2014-08-21 NOTE — Telephone Encounter (Signed)
Caller name:Joy-Gentiva Relation to OE:UMPN Call back number:[phone] Pharmacy:  Reason for call: needing clarification on orders for speech evaluation, and also pt is needing documentation from the dr. Charlett Blake for [facility] to provide his formula. [facility] is stating they are needing documentation as to why the pt has to have the formula

## 2014-08-21 NOTE — Telephone Encounter (Signed)
[name]  [name] called and said she needs office notes and a letter for medicare qualification. Please call her

## 2014-08-22 NOTE — Telephone Encounter (Signed)
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 These are also appropriate diagnoses as to why he needs formula. Need to clarify what formula he has tolerated and what they will allow him to get. Mother should know what formula has worked.

## 2014-08-22 NOTE — Telephone Encounter (Signed)
pts mother wants to disregard the speech evaluation after speaking to nurse that comes to her house

## 2014-08-22 NOTE — Telephone Encounter (Signed)
Per pts mother: Needs letter to say there is a status change, pts health declining in the past year, been in the hospital twice for aspiration pneumonia, has lost weight in the past year (23 pounds 115 to 92), is now 100% tube feed permanetly.  Dr Elsworth Soho informed pts mother during visit that pt should be on tube feeds 100%

## 2014-08-22 NOTE — Telephone Encounter (Signed)
[name] calling in again regarding this.

## 2014-08-22 NOTE — Telephone Encounter (Signed)
I cannot anser these questions without more input. Please call Baxter Flattery and clarify that a letter is sufficient. I am afraid this is going to require a face to face visit to document for Medicare purposes. I have not seen him since he was advised to proceed with all tube feeds and I am not sure which formula they have settled on. If he needs appt please schedule. Mother is the one who knows what formula they have been able to get that he tolerates. Please check with her

## 2014-08-23 NOTE — Telephone Encounter (Signed)
Spoke with Baxter Flattery who states that she needs current office notes that will support the PEG tube feeding. Patient will need a face to face. Appt is scheduled to see Dr Charlett Blake.  Evaluation should denote status change, PEG necessary to sustain, eating is for please but will not sustain life.

## 2014-08-23 NOTE — Telephone Encounter (Signed)
thanks

## 2014-08-23 NOTE — Telephone Encounter (Signed)
LM for Baxter Flattery, dietician to return the call

## 2014-08-23 NOTE — Telephone Encounter (Signed)
[name] will you please contact this agency and see what they are needing the note to say and the other information that Dr Charlett Blake has listed below.  Thanks

## 2014-08-24 DIAGNOSIS — J984 Other disorders of lung: Secondary | ICD-10-CM | POA: Diagnosis not present

## 2014-08-24 DIAGNOSIS — R1312 Dysphagia, oropharyngeal phase: Secondary | ICD-10-CM | POA: Diagnosis not present

## 2014-08-24 DIAGNOSIS — Z431 Encounter for attention to gastrostomy: Secondary | ICD-10-CM | POA: Diagnosis not present

## 2014-08-24 DIAGNOSIS — G809 Cerebral palsy, unspecified: Secondary | ICD-10-CM | POA: Diagnosis not present

## 2014-08-24 DIAGNOSIS — E43 Unspecified severe protein-calorie malnutrition: Secondary | ICD-10-CM | POA: Diagnosis not present

## 2014-08-24 DIAGNOSIS — M419 Scoliosis, unspecified: Secondary | ICD-10-CM | POA: Diagnosis not present

## 2014-08-27 ENCOUNTER — Telehealth: Payer: Self-pay

## 2014-08-27 NOTE — Telephone Encounter (Signed)
Will discuss at visit

## 2014-08-27 NOTE — Telephone Encounter (Signed)
Please advise? Or wait until tomorrow at pts visit? Mom stated last week that she doesn't think patient needs this?

## 2014-08-27 NOTE — Telephone Encounter (Signed)
[facility] - Speech Pathologist - [phone] Evette Georges recommends current Modified Barium Swallow Test - If you agree please fax order to [phone]

## 2014-08-28 ENCOUNTER — Encounter: Payer: Self-pay | Admitting: Physical Medicine & Rehabilitation

## 2014-08-28 ENCOUNTER — Telehealth: Payer: Self-pay

## 2014-08-28 ENCOUNTER — Ambulatory Visit (INDEPENDENT_AMBULATORY_CARE_PROVIDER_SITE_OTHER): Payer: Medicare Other | Admitting: Family Medicine

## 2014-08-28 ENCOUNTER — Encounter: Payer: Self-pay | Admitting: Family Medicine

## 2014-08-28 ENCOUNTER — Encounter: Payer: Medicare Other | Attending: Physical Medicine & Rehabilitation | Admitting: Physical Medicine & Rehabilitation

## 2014-08-28 VITALS — BP 91/53 | HR 56 | Temp 97.5°F | Ht 62.0 in

## 2014-08-28 VITALS — BP 90/64 | HR 68 | Resp 14 | Wt 91.0 lb

## 2014-08-28 DIAGNOSIS — G809 Cerebral palsy, unspecified: Secondary | ICD-10-CM

## 2014-08-28 DIAGNOSIS — G8250 Quadriplegia, unspecified: Secondary | ICD-10-CM | POA: Insufficient documentation

## 2014-08-28 DIAGNOSIS — J690 Pneumonitis due to inhalation of food and vomit: Secondary | ICD-10-CM

## 2014-08-28 DIAGNOSIS — E860 Dehydration: Secondary | ICD-10-CM

## 2014-08-28 DIAGNOSIS — R1312 Dysphagia, oropharyngeal phase: Secondary | ICD-10-CM | POA: Insufficient documentation

## 2014-08-28 DIAGNOSIS — R634 Abnormal weight loss: Secondary | ICD-10-CM

## 2014-08-28 DIAGNOSIS — R1314 Dysphagia, pharyngoesophageal phase: Secondary | ICD-10-CM

## 2014-08-28 DIAGNOSIS — G808 Other cerebral palsy: Secondary | ICD-10-CM

## 2014-08-28 DIAGNOSIS — L0100 Impetigo, unspecified: Secondary | ICD-10-CM

## 2014-08-28 HISTORY — DX: Abnormal weight loss: R63.4

## 2014-08-28 MED ORDER — DANTROLENE SODIUM 50 MG PO CAPS: 50.0000 mg | ORAL_CAPSULE | Freq: Two times a day (BID) | ORAL | Status: DC

## 2014-08-28 MED ORDER — DIAZEPAM 2 MG PO TABS: 2.0000 mg | ORAL_TABLET | Freq: Every day | ORAL | Status: DC

## 2014-08-28 MED ORDER — JEVITY 1.5 CAL/FIBER PO LIQD: 1000.0000 mL | Freq: Every day | ORAL | Status: DC

## 2014-08-28 MED ORDER — BACITRACIN 500 UNIT/GM EX OINT: 1.0000 "application " | TOPICAL_OINTMENT | Freq: Two times a day (BID) | CUTANEOUS | Status: DC

## 2014-08-28 NOTE — Telephone Encounter (Signed)
Need fax # for [facility]

## 2014-08-28 NOTE — Progress Notes (Signed)
Pre visit review using our clinic review tool, if applicable. No additional management support is needed unless otherwise documented below in the visit note. 

## 2014-08-28 NOTE — Patient Instructions (Signed)
Decrease dantrium to twice daily.

## 2014-08-28 NOTE — Progress Notes (Signed)
Subjective:    Patient ID: [name], male    DOB: [birth year], 28 y.o.   MRN: [id number]  HPI  [name] is back regarding his spastic tetraplegia. He has had good results with the botox in his neck and his legs. We injected his hip adductors primarily at last visit in August. (only 50 u in left trap).   Unfortunately, mother feels that swallowing has worsened over the last year and he has lost weight as well. He is being followed pulmonary medicine and is currently NPO. A MBS is being arranged.   He continues on dantrium 50u tid. Valium still is at 5mg  at night.  Pain Inventory Average Pain 2 Pain Right Now 0 My pain is intermittent, sharp, dull and aching  In the last 24 hours, has pain interfered with the following? General activity 0 Relation with others 0 Enjoyment of life 0 What TIME of day is your pain at its worst? . Sleep (in general) Good  Pain is worse with: . Pain improves with: . Relief from Meds: 5  Mobility use a wheelchair  Function disabled: date disabled .  Neuro/Psych No problems in this area  Prior Studies Any changes since last visit?  no  Physicians involved in your care Any changes since last visit?  no   Family History  Problem Relation Age of Onset  . Asthma Mother   . Hyperlipidemia Mother   . COPD Mother   . Other Mother     bronchial stasis/ABPA  . Cancer Maternal Grandmother 61    breast  . Hyperlipidemia Maternal Grandmother   . Hypertension Maternal Grandmother   . Cancer Maternal Grandfather     prostate  . Heart disease Paternal Grandfather     CHF  . Osteoporosis Paternal Grandmother   . Arthritis Paternal Grandmother     rheumatoid   History   Social History  . Marital Status: Single    Spouse Name: N/A    Number of Children: 0  . Years of Education: N/A   Occupational History  . disbaled    Social History Main Topics  . Smoking status: Never Smoker   . Smokeless tobacco: Never Used  . Alcohol Use: No  .  Drug Use: No  . Sexual Activity: No   Other Topics Concern  . None   Social History Narrative  . None   Past Surgical History  Procedure Laterality Date  . Spine surgery  ,11/20/2010, 2011  . Eye surgery    . Ears tubes    . Hamstring released    . Baclofen trial    . Baslofen pump implant    . Spinal fusion    . G-tube insert  August 2006  . Spinal fusioncorrect 106 degree kyphosis    . Spinal fusion to correct 70 degree kyphosis  11-2010  . Tonsillectomy    . Peg placement  10/21/2011    Procedure: PERCUTANEOUS ENDOSCOPIC GASTROSTOMY (PEG) REPLACEMENT;  Surgeon: [name], MD;  Location: [facility];  Service: Endoscopy;  Laterality: N/A;  . Peg placement N/A 06/13/2013    Procedure: PERCUTANEOUS ENDOSCOPIC GASTROSTOMY (PEG) REPLACEMENT;  Surgeon: [name], MD;  Location: [facility];  Service: Endoscopy;  Laterality: N/A;  . Hip surgery      x2 , side    Past Medical History  Diagnosis Date  . Cerebral palsy   . GERD (gastroesophageal reflux disease)   . Anxiety   . Depression   . Thyroid disease  hyper  . Incontinence of feces   . Palpitations   . Esophagitis   . Dehydration 11/22/2013  . Depression with anxiety 08/01/2010    Qualifier: Diagnosis of  By: Nelson-Smith CMA (AAMA), Dottie    . Loss of weight 08/28/2014   BP 90/64  Pulse 68  Resp 14  Wt 91 lb (41.277 kg)  SpO2 97%  Opioid Risk Score:   Fall Risk Score: Low Fall Risk (0-5 points)    Review of Systems     Objective:   Physical Exam  General: Alert and oriented x 3, No apparent distress. He is sitting in a motorized chair with head rest and trunk supports. He appears to be in good spirits.  HEENT: Head is normocephalic, atraumatic, PERRLA, EOMI, sclera anicteric, oral mucosa pink and moist, dentition intact, ext ear canals clear,  Neck: Supple without JVD or lymphadenopathy  Heart: Reg rate and rhythm. No murmurs rubs or gallops  Chest: CTA bilaterally without wheezes, rales, or  rhonchi; no distress  Abdomen: Soft, non-tender, non-distended, bowel sounds positive.  Extremities: No clubbing, cyanosis, or edema. Pulses are 2+  Skin: Clean and intact   Neuro:He has improved oralmotor control. He appears much more alert. His eye contact his better. Posture is better. He holds his head more in a neutral position although it remains rotated and bent to the left.. Tone in the UE is 3-4/4 pec major/minor, 3/4 bicep and wrist/HI--p He had fluctuating tone in his hamstrings, quads 2/4, HAD's at 0/4. When supine he was able to lay surprisingly flat. Heel cords were tight as well at 3/4. Pt with minimal volitional movement on exam. He can sense pain in the trunk and extremities  Musculoskeletal: low back, trunk and cervical ROM is limited. He has kyphosis. . Both traps and SCM's are improved from baseline. Head posture much improved. His hip adductors are much improved 1/4.  Psych: Pt's affect is appropriate. Pt is cooperative given his cognitive linguistic issues. Does understand basic commands and answers questions although his speech is difficult to understand.   Assessment & Plan:  1. CP with spastic tetraplegia  2. 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 Severe oro-pharyngeal dysphagia with G-tube. Worsened over the last several months. ?response to botox vs dantrium? Central process?   Plan:  1. Hold on further botox for now, although I don't feel these were likely related to increased dysphagia/aspiration. Will order CT of neck and head to assess of new changes which could be relate to dysphagia.   2. Some of this dysphagia could be related to dantrium (again [place] but worth considering) will decrease dantrium to 50mg  BID for now. Additionally, will again try to slowly wean the valium---try to go down to 4mg  first and decrease by 1mg  increments afterwards. Would maintain depakote at  current dosing for now.  3. Lidoderm patches, 5mg , 2 over the lower lumbar spine, nightly before bed.  4. Follow up in about 2 months. 25 minutes of face to face patient care time were spent during this visit. All questions were encouraged and answered.

## 2014-08-29 ENCOUNTER — Telehealth: Payer: Self-pay | Admitting: *Deleted

## 2014-08-29 NOTE — Telephone Encounter (Signed)
Pharmacist needed to clarify med sig... 1 50 mg cap BID and 2 at Bed time.  Spoke to RX - quantity should be 120.  Gave verbal order

## 2014-08-30 ENCOUNTER — Other Ambulatory Visit (HOSPITAL_COMMUNITY): Payer: Self-pay | Admitting: Family Medicine

## 2014-08-30 DIAGNOSIS — R1310 Dysphagia, unspecified: Secondary | ICD-10-CM

## 2014-08-31 ENCOUNTER — Telehealth: Payer: Self-pay | Admitting: Family Medicine

## 2014-08-31 DIAGNOSIS — M419 Scoliosis, unspecified: Secondary | ICD-10-CM | POA: Diagnosis not present

## 2014-08-31 DIAGNOSIS — G809 Cerebral palsy, unspecified: Secondary | ICD-10-CM | POA: Diagnosis not present

## 2014-08-31 DIAGNOSIS — Z431 Encounter for attention to gastrostomy: Secondary | ICD-10-CM | POA: Diagnosis not present

## 2014-08-31 DIAGNOSIS — J984 Other disorders of lung: Secondary | ICD-10-CM | POA: Diagnosis not present

## 2014-08-31 DIAGNOSIS — E43 Unspecified severe protein-calorie malnutrition: Secondary | ICD-10-CM | POA: Diagnosis not present

## 2014-08-31 DIAGNOSIS — R1312 Dysphagia, oropharyngeal phase: Secondary | ICD-10-CM | POA: Diagnosis not present

## 2014-08-31 NOTE — Telephone Encounter (Signed)
Caller name: Joy Relation to pt: Home Health Nurse  Call back number: [phone]   Reason for call:   Requesting orders for [place] regarding disease education and nutrition.

## 2014-09-02 ENCOUNTER — Other Ambulatory Visit: Payer: Self-pay | Admitting: Family Medicine

## 2014-09-02 DIAGNOSIS — L0100 Impetigo, unspecified: Secondary | ICD-10-CM | POA: Insufficient documentation

## 2014-09-02 DIAGNOSIS — G809 Cerebral palsy, unspecified: Secondary | ICD-10-CM

## 2014-09-02 DIAGNOSIS — R1314 Dysphagia, pharyngoesophageal phase: Secondary | ICD-10-CM | POA: Insufficient documentation

## 2014-09-02 NOTE — Assessment & Plan Note (Signed)
Right ear, worsening, started on bactroban and cefdinir

## 2014-09-02 NOTE — Assessment & Plan Note (Signed)
Resolved with decreased po intake and treatment

## 2014-09-02 NOTE — Assessment & Plan Note (Signed)
Doing better with PEG tube feeds

## 2014-09-02 NOTE — Assessment & Plan Note (Signed)
No longer able to tolerate po intake, dysphagia worsening, will now remain NPO and mom and patient are in agreement. Preferred feeds are Jevity 1.5 with probiotics and fiber, 355 ml via PEG tube

## 2014-09-02 NOTE — Progress Notes (Signed)
[name] [id number] 1986/01/11 09/02/2014      Progress Note-Follow Up  Subjective  Chief Complaint  Chief Complaint  Patient presents with  . Follow-up    HPI  Patient is a 28  year old male in today for routine medical care. With spastic CP, dysphagia continues to worsen has had numerous hospitalizations dating back to October of 2014 with recurrent aspiration pneumonia and as a result significant weight loss and agitation. He is coughing less and has not been hospitalized again since they finally decided to make him completely NPO. Mom reports he is in agreement and does not ask for food any more.   Past Medical History  Diagnosis Date  . Cerebral palsy   . GERD (gastroesophageal reflux disease)   . Anxiety   . Depression   . Thyroid disease     hyper  . Incontinence of feces   . Palpitations   . Esophagitis   . Dehydration 11/22/2013  . Depression with anxiety 08/01/2010    Qualifier: Diagnosis of  By: Nelson-Smith CMA (AAMA), Dottie    . Loss of weight 08/28/2014    Past Surgical History  Procedure Laterality Date  . Spine surgery  ,11/20/2010, 2011  . Eye surgery    . Ears tubes    . Hamstring released    . Baclofen trial    . Baslofen pump implant    . Spinal fusion    . G-tube insert  August 2006  . Spinal fusioncorrect 106 degree kyphosis    . Spinal fusion to correct 70 degree kyphosis  11-2010  . Tonsillectomy    . Peg placement  10/21/2011    Procedure: PERCUTANEOUS ENDOSCOPIC GASTROSTOMY (PEG) REPLACEMENT;  Surgeon: [name], MD;  Location: [facility];  Service: Endoscopy;  Laterality: N/A;  . Peg placement N/A 06/13/2013    Procedure: PERCUTANEOUS ENDOSCOPIC GASTROSTOMY (PEG) REPLACEMENT;  Surgeon: [name], MD;  Location: [facility];  Service: Endoscopy;  Laterality: N/A;  . Hip surgery      x2 , side     Family History  Problem Relation Age of Onset  . Asthma Mother   . Hyperlipidemia Mother   . COPD Mother   . Other Mother      bronchial stasis/ABPA  . Cancer Maternal Grandmother 23    breast  . Hyperlipidemia Maternal Grandmother   . Hypertension Maternal Grandmother   . Cancer Maternal Grandfather     prostate  . Heart disease Paternal Grandfather     CHF  . Osteoporosis Paternal Grandmother   . Arthritis Paternal Grandmother     rheumatoid    History   Social History  . Marital Status: Single    Spouse Name: N/A    Number of Children: 0  . Years of Education: N/A   Occupational History  . disbaled    Social History Main Topics  . Smoking status: Never Smoker   . Smokeless tobacco: Never Used  . Alcohol Use: No  . Drug Use: No  . Sexual Activity: No   Other Topics Concern  . Not on file   Social History Narrative  . No narrative on file    Current Outpatient Prescriptions on File Prior to Visit  Medication Sig Dispense Refill  . AMBULATORY NON FORMULARY MEDICATION Medication Name: MIC gastrostomy/bolus feeding tube 24 French Part number 0110-24. #2 and 10 cc lurer lock syringe #2 Dx: 2 Device 1  . clotrimazole-betamethasone (LOTRISONE) cream Apply 1 application topically 2 (two)  times daily. 45 g 1  . divalproex (DEPAKOTE SPRINKLE) 125 MG capsule Take 2 capsules in morning, 5 capsules at bedtime 210 capsule 4  . Feeding Tubes - Bags (FLEXIFLO FEEDING BAG/PUMP) MISC 1 Units by Does not apply route continuous. 1 each 30  . Feeding Tubes - Pump MISC 1 Units by Does not apply route continuous. 1 each 0  . Feeding Tubes - Sets (KANGAROO EPUMP SET 1000ML) MISC 30 day supply of Kangaroo Joey PUmp set with flush bag 1 each 5  . Incontinence Supplies (BARD LEG BAG STRAPS/FABRIC) MISC Bard Dispoz-a-Bag leg bag w/flip flo Valve, sterile, w/Gabric strap, 18" extension tubing 19 oz  Item #[id number] 2 each 6  . Incontinence Supply Disposable (PREVAIL BREEZERS MEDIUM) MISC pkg of 16- size medium 32" to 44"  Breathable cloth-like outer fabric (can't use the plastic outer surgace  Item #  [id number]/[id number] 16 each 6  . LORazepam (ATIVAN) 1 MG tablet Take 1 tab po tid prn for anxiety 90 tablet 1  . NON FORMULARY Bard Leg Bag Extension tubing w/Connector 18", Sterile, latex-free  Item# [id number]    . NON FORMULARY Colorplast Freedom Cath Latex Self-Adhering Male External Catheter 110mm Diameter Intermediate  Item# [id number]    . nystatin cream (MYCOSTATIN) Apply 1 application topically 2 (two) times daily as needed for dry skin. 30 g 1  . OLANZapine (ZYPREXA) 5 MG tablet Take 5 mg by mouth at bedtime.    Earney Navy Bicarbonate (ZEGERID) 20-1100 MG CAPS capsule Take 1 capsule by mouth daily before breakfast.    . Ostomy Supplies (PROTECTIVE BARRIER WIPES) MISC 1-1/4" X 3"  Item #[id number] 75 each 6  . oxybutynin (DITROPAN) 5 MG/5ML syrup     . PARoxetine (PAXIL) 30 MG tablet tape down per neuro phyciatrist- 15 mg    . PRESCRIPTION MEDICATION G-tube    . PRESCRIPTION MEDICATION Colorplast Freedom Cath Latex Self-Adhering Male External Catheter 39mm Diameter Intermediate  Item# [id number]    . Probiotic Product (ADVANCED PROBIOTIC 10) CAPS 2 tablespoons    . sucralfate (CARAFATE) 1 G tablet 1 tablet twice daily via PEG 60 tablet 1  . cefdinir (OMNICEF) 250 MG/5ML suspension Take 5 mLs (250 mg total) by mouth 2 (two) times daily. 6cc down peg tube bid X 10 days 120 mL 0  . dantrolene (DANTRIUM) 50 MG capsule Take 1 capsule (50 mg total) by mouth 2 (two) times daily. 1 tab bid and 2 tab at qhs 60 capsule 4  . diazepam (VALIUM) 2 MG tablet Take 1-2 tablets (2-4 mg total) by mouth at bedtime. 60 tablet 2   No current facility-administered medications on file prior to visit.    Allergies  Allergen Reactions  . Ambien [Zolpidem Tartrate] Nausea Only  . Codeine   . Baclofen Anxiety  . Diazepam Anxiety  . Sulfonamide Derivatives Rash    Review of Systems  Review of Systems  Constitutional: Negative for fever and malaise/fatigue.  HENT: Positive for congestion.   Eyes: Negative for  discharge.  Respiratory: Negative for shortness of breath.   Cardiovascular: Negative for chest pain, palpitations and leg swelling.  Gastrointestinal: Negative for nausea, abdominal pain and diarrhea.  Genitourinary: Negative for dysuria.  Musculoskeletal: Negative for falls.  Skin: Positive for rash.  Neurological: Negative for loss of consciousness and headaches.  Endo/Heme/Allergies: Negative for polydipsia.  Psychiatric/Behavioral: Negative for depression and suicidal ideas. The patient is nervous/anxious. The patient does not have insomnia.     Objective  BP 91/53 mmHg  Pulse 56  Temp(Src) 97.5 F (36.4 C) (Tympanic)  Ht 5\' 2"  (1.575 m)  Wt   SpO2 96%  Physical Exam  Physical Exam  Constitutional: He is oriented to person, place, and time and well-developed, well-nourished, and in no distress. No distress.  HENT:  Head: Normocephalic and atraumatic.  Eyes: Conjunctivae are normal.  Neck: Neck supple. No thyromegaly present.  Cardiovascular: Normal rate, regular rhythm and normal heart sounds.   No murmur heard. Pulmonary/Chest: Effort normal and breath sounds normal. No respiratory distress.  Abdominal: Soft. Bowel sounds are normal. He exhibits no distension and no mass. There is no tenderness.  LUQ peg tube in place  Musculoskeletal: He exhibits no edema.  Neurological: He is alert and oriented to person, place, and time.  Spastic CP  Skin: Skin is warm.  Psychiatric: Memory, affect and judgment normal.    Lab Results  Component Value Date   TSH 0.774 06/08/2014   Lab Results  Component Value Date   WBC 5.8 05/17/2014   HGB 14.3 05/17/2014   HCT 43.5 05/17/2014   MCV 87.2 05/17/2014   PLT 249 05/17/2014   Lab Results  Component Value Date   CREATININE 0.40* 05/17/2014   BUN 8 05/17/2014   NA 141 05/17/2014   K 4.1 05/17/2014   CL 99 05/17/2014   CO2 26 05/17/2014   Lab Results  Component Value Date   ALT 8 05/17/2014   AST 13 05/17/2014    ALKPHOS 65 05/17/2014   BILITOT 0.3 05/17/2014   Lab Results  Component Value Date   CHOL 151 04/25/2010   Lab Results  Component Value Date   HDL 29.90* 04/25/2010   Lab Results  Component Value Date   LDLCALC 96 04/25/2010   Lab Results  Component Value Date   TRIG 125.0 04/25/2010   Lab Results  Component Value Date   CHOLHDL 5 04/25/2010     Assessment & Plan  Impetigo Right ear, worsening, started on bactroban and cefdinir  Loss of weight No longer able to tolerate po intake, dysphagia worsening, will now remain NPO and mom and patient are in agreement. Preferred feeds are Jevity 1.5 with probiotics and fiber, 355 ml via PEG tube  ASPIRATION PNEUMONIA Resolved with decreased po intake and treatment  Dehydration Doing better with PEG tube feeds

## 2014-09-03 ENCOUNTER — Ambulatory Visit (HOSPITAL_COMMUNITY)
Admission: RE | Admit: 2014-09-03 | Discharge: 2014-09-03 | Disposition: A | Discharge status: Home / Self Care | Payer: Medicare Other | Source: Ambulatory Visit | Attending: Family Medicine | Admitting: Family Medicine

## 2014-09-03 ENCOUNTER — Encounter: Payer: Self-pay | Admitting: Family Medicine

## 2014-09-03 DIAGNOSIS — R1310 Dysphagia, unspecified: Secondary | ICD-10-CM

## 2014-09-03 DIAGNOSIS — R130 Aphagia: Secondary | ICD-10-CM | POA: Diagnosis not present

## 2014-09-03 DIAGNOSIS — G809 Cerebral palsy, unspecified: Secondary | ICD-10-CM | POA: Diagnosis not present

## 2014-09-03 DIAGNOSIS — R05 Cough: Secondary | ICD-10-CM | POA: Diagnosis not present

## 2014-09-03 DIAGNOSIS — R634 Abnormal weight loss: Secondary | ICD-10-CM

## 2014-09-03 DIAGNOSIS — R1314 Dysphagia, pharyngoesophageal phase: Secondary | ICD-10-CM

## 2014-09-03 DIAGNOSIS — J690 Pneumonitis due to inhalation of food and vomit: Secondary | ICD-10-CM | POA: Diagnosis not present

## 2014-09-03 NOTE — Procedures (Signed)
Objective Swallowing Evaluation: Modified Barium Swallowing Study  Patient Details  Name: [name] MRN: [id number] Date of Birth: 01-18-1986  Today's Date: 09/03/2014 Time: 1315-1400 SLP Time Calculation (min): 45 min  Past Medical History:  Past Medical History  Diagnosis Date  . Cerebral palsy   . GERD (gastroesophageal reflux disease)   . Anxiety   . Depression   . Thyroid disease     hyper  . Incontinence of feces   . Palpitations   . Esophagitis   . Dehydration 11/22/2013  . Depression with anxiety 08/01/2010    Qualifier: Diagnosis of  By: Nelson-Smith CMA (AAMA), Dottie    . Loss of weight 08/28/2014   Past Surgical History:  Past Surgical History  Procedure Laterality Date  . Spine surgery  ,11/20/2010, 2011  . Eye surgery    . Ears tubes    . Hamstring released    . Baclofen trial    . Baslofen pump implant    . Spinal fusion    . G-tube insert  August 2006  . Spinal fusioncorrect 106 degree kyphosis    . Spinal fusion to correct 70 degree kyphosis  11-2010  . Tonsillectomy    . Peg placement  10/21/2011    Procedure: PERCUTANEOUS ENDOSCOPIC GASTROSTOMY (PEG) REPLACEMENT;  Surgeon: [name], MD;  Location: [facility];  Service: Endoscopy;  Laterality: N/A;  . Peg placement N/A 06/13/2013    Procedure: PERCUTANEOUS ENDOSCOPIC GASTROSTOMY (PEG) REPLACEMENT;  Surgeon: [name], MD;  Location: [facility];  Service: Endoscopy;  Laterality: N/A;  . 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  HX includes anxiety, posterior cervical spine fusions 9/11 and 1/12 to improve kyphosis.  Mother reports pt desired second fusion to help him to sit upright to watch sports,etc.   Pt has h/o swallowing difficulties and mother reports they were exacerbated after fusion.  Pt has had a PEG for years for nutritional support - yet continues to lose weight.  Pt has undergone botox treatments for  spasticity with last being August 28th - mother reports pt "likes the feel of the injections" due to his tightness.  Mother reports pt with increased coughing when getting tube feeding continuously but improved tolerance with bolus feeds.  Pt has had several bouts of pneumonia (January/July 2015) that last approximately 6-8 weeks now instead of 6-10 days per mother.  He requires pulmonary vest use and oral suctioning at home to help clear secretions as he is unable to expectorate.  Currently mother reports pt is requiring suction once a day after chest vest usage.  Critical care MD informed mother for pt to continue NPO and use tube feeding at least for several weeks.  He had been getting small amounts of pureed foods only - yogurt, applesauce, etc prior to that time.  Pt has also lost 24 pounds and has not seen a nutritionist outside of the hospital.  Mother reports RD who was to see them as outpt in September called and said she did not manage pt's with tube feeding and pt has not been referred to another dietician yet.  Pt xerostomic and mother reports pt is increasingly resistant to mouthcare. .       Assessment / Plan / Recommendation Clinical Impression  Dysphagia Diagnosis: Moderate oral phase dysphagia;Severe pharyngeal phase dysphagia Clinical impression: Pt with significantly worsened dysphagia compared to MBS  completed 12/2013.  Oral deficits continue to be marked by delayed transiting, poor coordination and lingual pumping with resultant premature spillage of barium into pharynx.  Oropharyngeal swallow improvement was not observed as testing continued as noted on prior MBS "warm up effect".    Pharyngeal swallow was delayed due to sensory deficits and weak resulting in severe residuals without pt awareness.  Pyriform sinus space is shallow which limits pt's abiilty to retain residuals/secretions.  Trace aspiration and penetration did not clear with cued cough nor attempts for pt to dry swallow.   Reflexive dry swallows were weak and ineffective to clear stasis - minimally helpful at best.     Pt's mother orally suctioned him eliciting a gag response which allowed regurgitation of pharyngeal residuals into oral cavity where she was able to remove with suction.    Recommend pt be npo except 1/3 tsp water after oral care go maximize pt's oral hygiene due to his resistance to oral care.    Educated mother thoroughly to suspected ongoing aspiration of secretions and tube feeding not preventing aspiration but providing pt with nutrition.  Rec pt see a dietician to help maximize his nutrition via PEG.      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  HX includes anxiety, posterior cervical spine fusions 9/11 and 1/12 to improve kyphosis.  Mother reports pt desired second fusion to help him to sit upright to watch sports,etc.   Pt has h/o swallowing difficulties and mother reports they were exacerbated after fusion.  Pt has had a PEG for years for nutritional support - yet continues to lose weight.  Pt has undergone botox treatments for spasticity with last being August 28th - mother reports pt "likes the feel of the injections" due to his tightness.  Mother reports pt with increased coughing when getting tube feeding continuously but improved tolerance with bolus feeds.  Pt has had several bouts of pneumonia (January/July 2015) that last approximately 6-8 weeks now instead of 6-10 days per mother.  He requires pulmonary vest use and oral suctioning at home to help clear secretions as he is unable to expectorate.   Currently mother reports pt is requiring suction once a day after chest vest usage.  Critical care MD informed mother for pt to continue NPO and use tube feeding at least for several weeks.  He had been getting small amounts of pureed foods only - yogurt, applesauce, etc prior to that time.  Pt has also lost 24 pounds and has not seen a nutritionist outside of the hospital.  Mother reports RD who was to see them as outpt in September called and said she did not manage pt's with tube feeding and pt has not been referred to another dietician yet.  Pt xerostomic and mother reports pt is increasingly resistant to mouthcare. .   Type of Study: Modified Barium Swallowing Study Reason for Referral: Objectively evaluate swallowing function Diet Prior to this Study: NPO Temperature Spikes Noted: No Respiratory Status: Room air History of Recent Intubation: No Behavior/Cognition: Alert;Cooperative;Decreased sustained attention Oral Cavity - Dentition: Adequate natural dentition Oral Motor /  Sensory Function: Impaired sensory;Impaired motor (gag sensitive and bilateral, lingual motor deficits) Self-Feeding Abilities: Total assist (slp requested mother feed pt) Patient Positioning: Upright in chair Baseline Vocal Quality: Low vocal intensity (pt essentially aphonic, does not verbalize per mom) Volitional Cough: Weak (nonproductive) Volitional Swallow: Unable to elicit (xerostomia significant) Pharyngeal Secretions: Standing secretions in (comment) (pharynx, mixed with barium and did not clear)    Reason for Referral Objectively evaluate swallowing function   Oral Phase Oral Preparation/Oral Phase Oral Phase: Impaired Oral - Nectar Oral - Nectar Teaspoon: Weak lingual manipulation;Lingual pumping;Lingual/palatal residue;Delayed oral transit;Reduced posterior propulsion;Piecemeal swallowing Oral - Solids Oral - Puree: Weak lingual manipulation;Lingual pumping;Lingual/palatal residue;Delayed oral  transit;Reduced posterior propulsion;Piecemeal swallowing Oral Phase - Comment Oral Phase - Comment: SLP orally suctioned pt to remove barium residuals that he did not sense   Pharyngeal Phase Pharyngeal Phase Pharyngeal Phase: Impaired Pharyngeal - Nectar Pharyngeal - Nectar Teaspoon: Delayed swallow initiation;Reduced pharyngeal peristalsis;Reduced epiglottic inversion;Reduced anterior laryngeal mobility;Reduced laryngeal elevation;Reduced airway/laryngeal closure;Reduced tongue base retraction;Pharyngeal residue - cp segment;Pharyngeal residue - posterior pharnyx;Pharyngeal residue - pyriform sinuses;Pharyngeal residue - valleculae;Trace aspiration;Penetration/Aspiration during swallow Penetration/Aspiration details (nectar teaspoon): Material enters airway, passes BELOW cords without attempt by patient to eject out (silent aspiration) Pharyngeal - Solids Pharyngeal - Puree: Delayed swallow initiation;Reduced pharyngeal peristalsis;Reduced epiglottic inversion;Reduced anterior laryngeal mobility;Reduced laryngeal elevation;Reduced airway/laryngeal closure;Reduced tongue base retraction;Pharyngeal residue - posterior pharnyx;Pharyngeal residue - pyriform sinuses;Pharyngeal residue - valleculae Pharyngeal Phase - Comment Pharyngeal Comment: trace aspiration and penetration did not clear with cued cough nor attempts to dry swallow, reflexive dry swallows were weak and ineffective to clear stasis - minimally helpful at best, pt's mother orally suctioned him eliciting a gag response which allowed regurgitation of pharyngeal residuals into oral cavity where she was able to remove with suction  Cervical Esophageal Phase    GO    Cervical Esophageal Phase Cervical Esophageal Phase: Impaired Cervical Esophageal Phase - Nectar Nectar Teaspoon: Reduced cricopharyngeal relaxation Cervical Esophageal Phase - Solids Puree: Reduced cricopharyngeal relaxation Cervical Esophageal Phase -  Comment Cervical Esophageal Comment: minimal UES opening, compromised laryngeal elevation contributes to deficits likely     Functional Assessment Tool Used: clinical judgement Functional Limitations: Swallowing Swallow Current Status (O1224): At least 80 percent but less than 100 percent impaired, limited or restricted Swallow Goal Status [phone]): At least 80 percent but less than 100 percent impaired, limited or restricted Swallow Discharge Status [phone]): At least 80 percent but less than 100 percent impaired, limited or restricted    Luanna Salk, [facility] SLP [phone]

## 2014-09-04 MED ORDER — OSMOLITE 1.5 CAL PO LIQD: 1000.0000 mL | ORAL | Status: DC

## 2014-09-04 NOTE — Telephone Encounter (Signed)
rx printed for md to sign and fax 

## 2014-09-04 NOTE — Addendum Note (Signed)
Addended by: Varney Daily on: 09/04/2014 06:11 PM   Modules accepted: Orders, Medications

## 2014-09-05 ENCOUNTER — Encounter (HOSPITAL_BASED_OUTPATIENT_CLINIC_OR_DEPARTMENT_OTHER): Payer: Self-pay

## 2014-09-05 ENCOUNTER — Telehealth: Payer: Self-pay | Admitting: Family Medicine

## 2014-09-05 ENCOUNTER — Emergency Department (HOSPITAL_BASED_OUTPATIENT_CLINIC_OR_DEPARTMENT_OTHER): Payer: Medicare Other

## 2014-09-05 ENCOUNTER — Emergency Department (HOSPITAL_COMMUNITY)
Admission: EM | Admit: 2014-09-05 | Discharge: 2014-09-05 | Discharge status: Against Medical Advice | Payer: Medicare Other | Source: Home / Self Care

## 2014-09-05 ENCOUNTER — Inpatient Hospital Stay (HOSPITAL_BASED_OUTPATIENT_CLINIC_OR_DEPARTMENT_OTHER)
Admission: EM | Admit: 2014-09-05 | Discharge: 2014-09-08 | DRG: 177 | Disposition: A | Discharge status: Home / Self Care | Payer: Medicare Other | Attending: Internal Medicine | Admitting: Internal Medicine

## 2014-09-05 DIAGNOSIS — E43 Unspecified severe protein-calorie malnutrition: Secondary | ICD-10-CM | POA: Diagnosis not present

## 2014-09-05 DIAGNOSIS — R933 Abnormal findings on diagnostic imaging of other parts of digestive tract: Secondary | ICD-10-CM | POA: Diagnosis not present

## 2014-09-05 DIAGNOSIS — Z79899 Other long term (current) drug therapy: Secondary | ICD-10-CM

## 2014-09-05 DIAGNOSIS — Z792 Long term (current) use of antibiotics: Secondary | ICD-10-CM | POA: Diagnosis not present

## 2014-09-05 DIAGNOSIS — R1311 Dysphagia, oral phase: Secondary | ICD-10-CM | POA: Diagnosis present

## 2014-09-05 DIAGNOSIS — G800 Spastic quadriplegic cerebral palsy: Secondary | ICD-10-CM | POA: Diagnosis present

## 2014-09-05 DIAGNOSIS — Z66 Do not resuscitate: Secondary | ICD-10-CM | POA: Diagnosis present

## 2014-09-05 DIAGNOSIS — R05 Cough: Secondary | ICD-10-CM

## 2014-09-05 DIAGNOSIS — F419 Anxiety disorder, unspecified: Secondary | ICD-10-CM | POA: Diagnosis present

## 2014-09-05 DIAGNOSIS — R636 Underweight: Secondary | ICD-10-CM | POA: Diagnosis present

## 2014-09-05 DIAGNOSIS — J90 Pleural effusion, not elsewhere classified: Secondary | ICD-10-CM | POA: Diagnosis not present

## 2014-09-05 DIAGNOSIS — R1313 Dysphagia, pharyngeal phase: Secondary | ICD-10-CM | POA: Diagnosis present

## 2014-09-05 DIAGNOSIS — K625 Hemorrhage of anus and rectum: Secondary | ICD-10-CM | POA: Diagnosis present

## 2014-09-05 DIAGNOSIS — I959 Hypotension, unspecified: Secondary | ICD-10-CM | POA: Diagnosis present

## 2014-09-05 DIAGNOSIS — Z681 Body mass index (BMI) 19 or less, adult: Secondary | ICD-10-CM | POA: Diagnosis not present

## 2014-09-05 DIAGNOSIS — G809 Cerebral palsy, unspecified: Secondary | ICD-10-CM | POA: Diagnosis present

## 2014-09-05 DIAGNOSIS — K210 Gastro-esophageal reflux disease with esophagitis: Secondary | ICD-10-CM | POA: Diagnosis present

## 2014-09-05 DIAGNOSIS — R059 Cough, unspecified: Secondary | ICD-10-CM

## 2014-09-05 DIAGNOSIS — J690 Pneumonitis due to inhalation of food and vomit: Principal | ICD-10-CM | POA: Diagnosis present

## 2014-09-05 DIAGNOSIS — J984 Other disorders of lung: Secondary | ICD-10-CM | POA: Diagnosis not present

## 2014-09-05 DIAGNOSIS — Z993 Dependence on wheelchair: Secondary | ICD-10-CM

## 2014-09-05 DIAGNOSIS — Z931 Gastrostomy status: Secondary | ICD-10-CM

## 2014-09-05 DIAGNOSIS — R1314 Dysphagia, pharyngoesophageal phase: Secondary | ICD-10-CM | POA: Diagnosis not present

## 2014-09-05 DIAGNOSIS — R1310 Dysphagia, unspecified: Secondary | ICD-10-CM

## 2014-09-05 DIAGNOSIS — R159 Full incontinence of feces: Secondary | ICD-10-CM | POA: Diagnosis present

## 2014-09-05 DIAGNOSIS — R1319 Other dysphagia: Secondary | ICD-10-CM | POA: Diagnosis present

## 2014-09-05 DIAGNOSIS — Z981 Arthrodesis status: Secondary | ICD-10-CM | POA: Diagnosis not present

## 2014-09-05 DIAGNOSIS — Z431 Encounter for attention to gastrostomy: Secondary | ICD-10-CM | POA: Diagnosis not present

## 2014-09-05 DIAGNOSIS — S00512A Abrasion of oral cavity, initial encounter: Secondary | ICD-10-CM | POA: Diagnosis not present

## 2014-09-05 DIAGNOSIS — R1312 Dysphagia, oropharyngeal phase: Secondary | ICD-10-CM | POA: Diagnosis not present

## 2014-09-05 DIAGNOSIS — J189 Pneumonia, unspecified organism: Secondary | ICD-10-CM | POA: Diagnosis not present

## 2014-09-05 DIAGNOSIS — M419 Scoliosis, unspecified: Secondary | ICD-10-CM | POA: Diagnosis not present

## 2014-09-05 HISTORY — DX: Thyrotoxicosis, unspecified without thyrotoxic crisis or storm: E05.90

## 2014-09-05 LAB — I-STAT CG4 LACTIC ACID, ED: Lactic Acid, Venous: 1.32 mmol/L (ref 0.5–2.2)

## 2014-09-05 LAB — COMPREHENSIVE METABOLIC PANEL
ALT: 13 U/L (ref 0–53)
AST: 21 U/L (ref 0–37)
Albumin: 3.6 g/dL (ref 3.5–5.2)
Alkaline Phosphatase: 74 U/L (ref 39–117)
Anion gap: 10 (ref 5–15)
BUN: 11 mg/dL (ref 6–23)
CO2: 30 mEq/L (ref 19–32)
Calcium: 9.2 mg/dL (ref 8.4–10.5)
Chloride: 102 mEq/L (ref 96–112)
Creatinine, Ser: 0.5 mg/dL (ref 0.50–1.35)
GFR calc Af Amer: >90 mL/min (ref >90)
GFR calc non Af Amer: >90 mL/min (ref >90)
Glucose, Bld: 101 mg/dL — ABNORMAL HIGH (ref 70–99)
Potassium: 4.4 mEq/L (ref 3.7–5.3)
Sodium: 142 mEq/L (ref 137–147)
Total Bilirubin: 0.2 mg/dL — ABNORMAL LOW (ref 0.3–1.2)
Total Protein: 7.5 g/dL (ref 6.0–8.3)

## 2014-09-05 LAB — OCCULT BLOOD X 1 CARD TO LAB, STOOL: Fecal Occult Bld: NEGATIVE

## 2014-09-05 LAB — CBC WITH DIFFERENTIAL/PLATELET
Basophils Absolute: 0 10*3/uL (ref 0.0–0.1)
Basophils Relative: 0 % (ref 0–1)
Eosinophils Absolute: 0.3 10*3/uL (ref 0.0–0.7)
Eosinophils Relative: 4 % (ref 0–5)
HCT: 40.4 % (ref 39.0–52.0)
Hemoglobin: 13.3 g/dL (ref 13.0–17.0)
Lymphocytes Relative: 19 % (ref 12–46)
Lymphs Abs: 1.6 10*3/uL (ref 0.7–4.0)
MCH: 30.1 pg (ref 26.0–34.0)
MCHC: 32.9 g/dL (ref 30.0–36.0)
MCV: 91.4 fL (ref 78.0–100.0)
Monocytes Absolute: 0.7 10*3/uL (ref 0.1–1.0)
Monocytes Relative: 8 % (ref 3–12)
Neutro Abs: 5.4 10*3/uL (ref 1.7–7.7)
Neutrophils Relative %: 69 % (ref 43–77)
Platelets: 202 10*3/uL (ref 150–400)
RBC: 4.42 MIL/uL (ref 4.22–5.81)
RDW: 13.1 % (ref 11.5–15.5)
WBC: 8 10*3/uL (ref 4.0–10.5)

## 2014-09-05 LAB — URINALYSIS, ROUTINE W REFLEX MICROSCOPIC
Bilirubin Urine: NEGATIVE
Glucose, UA: NEGATIVE mg/dL
Hgb urine dipstick: NEGATIVE
Ketones, ur: NEGATIVE mg/dL
Nitrite: NEGATIVE
Protein, ur: 30 mg/dL — AB
Specific Gravity, Urine: 1.02 (ref 1.005–1.030)
Urobilinogen, UA: 1 mg/dL (ref 0.0–1.0)
pH: 8.5 — ABNORMAL HIGH (ref 5.0–8.0)

## 2014-09-05 LAB — URINE MICROSCOPIC-ADD ON

## 2014-09-05 MED ORDER — CHLORHEXIDINE GLUCONATE 0.12 % MT SOLN
15.0000 mL | Freq: Two times a day (BID) | OROMUCOSAL | Status: DC
  Administered 2014-09-06 – 2014-09-08 (×4): 15 mL via OROMUCOSAL
  Filled 2014-09-05 (×8): qty 15

## 2014-09-05 MED ORDER — CETYLPYRIDINIUM CHLORIDE 0.05 % MT LIQD
7.0000 mL | Freq: Two times a day (BID) | OROMUCOSAL | Status: DC
  Administered 2014-09-06 – 2014-09-08 (×5): 7 mL via OROMUCOSAL

## 2014-09-05 MED ORDER — SODIUM CHLORIDE 0.9 % IV BOLUS (SEPSIS)
1000.0000 mL | Freq: Once | INTRAVENOUS | Status: AC
  Administered 2014-09-05: 1000 mL via INTRAVENOUS

## 2014-09-05 MED ORDER — CEFTRIAXONE SODIUM 1 G IJ SOLR
INTRAMUSCULAR | Status: AC
  Filled 2014-09-05: qty 10

## 2014-09-05 MED ORDER — SODIUM CHLORIDE 0.9 % IV SOLN
INTRAVENOUS | Status: DC
  Administered 2014-09-05: via INTRAVENOUS

## 2014-09-05 MED ORDER — DEXTROSE 5 % IV SOLN
500.0000 mg | Freq: Once | INTRAVENOUS | Status: AC
  Administered 2014-09-05: 500 mg via INTRAVENOUS
  Filled 2014-09-05: qty 500

## 2014-09-05 MED ORDER — ACETAMINOPHEN 650 MG RE SUPP
650.0000 mg | Freq: Once | RECTAL | Status: AC
  Administered 2014-09-05: 650 mg via RECTAL
  Filled 2014-09-05: qty 1

## 2014-09-05 MED ORDER — CEFTRIAXONE SODIUM 1 G IJ SOLR
1.0000 g | Freq: Once | INTRAMUSCULAR | Status: AC
  Administered 2014-09-05: 1 g via INTRAVENOUS

## 2014-09-05 NOTE — ED Provider Notes (Signed)
CSN: [id number]     Arrival date & time 09/05/14  1558 History   First MD Initiated Contact with Patient 09/05/14 1652     Chief Complaint  Patient presents with  . Cough     (Consider location/radiation/quality/duration/timing/severity/associated sxs/prior Treatment) The history is provided by a relative and a parent.  [name] is a 28 y.o. male hx of cerebral palsy with quadriplegia, with G-tube and aspiration pneumonia here presenting with possible aspiration. He had a swallow eval done 4 days ago. He has been coughing for several weeks but as per mother that the cough was worse. Mental status is also declining. He has been losing weight. Last 3 days mother also noticed some bloody bowel movements. Sent from [facility]D for evaluation. Last admission was in Jan 2015.     Level V caveat- cerebral palsy   Past Medical History  Diagnosis Date  . Cerebral palsy   . GERD (gastroesophageal reflux disease)   . Anxiety   . Depression   . Thyroid disease     hyper  . Incontinence of feces   . Palpitations   . Esophagitis   . Dehydration 11/22/2013  . Depression with anxiety 08/01/2010    Qualifier: Diagnosis of  By: Nelson-Smith CMA (AAMA), Dottie    . Loss of weight 08/28/2014   Past Surgical History  Procedure Laterality Date  . Spine surgery  ,11/20/2010, 2011  . Eye surgery    . Ears tubes    . Hamstring released    . Baclofen trial    . Baslofen pump implant    . Spinal fusion    . G-tube insert  August 2006  . Spinal fusioncorrect 106 degree kyphosis    . Spinal fusion to correct 70 degree kyphosis  11-2010  . Tonsillectomy    . Peg placement  10/21/2011    Procedure: PERCUTANEOUS ENDOSCOPIC GASTROSTOMY (PEG) REPLACEMENT;  Surgeon: [name], MD;  Location: [facility];  Service: Endoscopy;  Laterality: N/A;  . Peg placement N/A 06/13/2013    Procedure: PERCUTANEOUS ENDOSCOPIC GASTROSTOMY (PEG) REPLACEMENT;  Surgeon: [name], MD;  Location: [facility];  Service:  Endoscopy;  Laterality: N/A;  . Hip surgery      x2 , side    Family History  Problem Relation Age of Onset  . Asthma Mother   . Hyperlipidemia Mother   . COPD Mother   . Other Mother     bronchial stasis/ABPA  . Cancer Maternal Grandmother 72    breast  . Hyperlipidemia Maternal Grandmother   . Hypertension Maternal Grandmother   . Cancer Maternal Grandfather     prostate  . Heart disease Paternal Grandfather     CHF  . Osteoporosis Paternal Grandmother   . Arthritis Paternal Grandmother     rheumatoid   History  Substance Use Topics  . Smoking status: Never Smoker   . Smokeless tobacco: Never Used  . Alcohol Use: No    Review of Systems  Respiratory: Positive for cough.   Gastrointestinal: Positive for blood in stool.  All other systems reviewed and are negative.     Allergies  Ambien; Codeine; Baclofen; Diazepam; and Sulfonamide derivatives  Home Medications   Prior to Admission medications   Medication Sig Start Date End Date Taking? Authorizing Provider  AMBULATORY NON FORMULARY MEDICATION Medication Name: MIC gastrostomy/bolus feeding tube 24 French Part number 0110-24. #2 and 10 cc lurer lock syringe #2 Dx: 07/16/14   [name], MD  bacitracin  500 UNIT/GM ointment Apply 1 application topically 2 (two) times daily. 08/28/14   [name], MD  cefdinir (OMNICEF) 250 MG/5ML suspension Take 5 mLs (250 mg total) by mouth 2 (two) times daily. 6cc down peg tube bid X 10 days 07/16/14   [name], MD  clotrimazole-betamethasone (LOTRISONE) cream Apply 1 application topically 2 (two) times daily. 03/27/14   [name], MD  dantrolene (DANTRIUM) 50 MG capsule Take 1 capsule (50 mg total) by mouth 2 (two) times daily. 1 tab bid and 2 tab at qhs 08/28/14   Meredith Staggers, MD  diazepam (VALIUM) 2 MG tablet Take 1-2 tablets (2-4 mg total) by mouth at bedtime. 08/28/14   Meredith Staggers, MD  divalproex (DEPAKOTE SPRINKLE) 125 MG capsule Take 2 capsules in  morning, 5 capsules at bedtime 12/11/13   Cameron Sprang, MD  Feeding Tubes - Bags (FLEXIFLO FEEDING BAG/PUMP) MISC 1 Units by Does not apply route continuous. 05/28/14   [name], MD  Feeding Tubes - Pump MISC 1 Units by Does not apply route continuous. 05/28/14   [name], MD  Feeding Tubes - Sets [facility] EPUMP SET 1000ML) MISC 30 day supply of [place] PUmp set with flush bag 05/28/14   [name], MD  Incontinence Supplies (BARD LEG BAG STRAPS/FABRIC) MISC Bard Dispoz-a-Bag leg bag w/flip flo Valve, sterile, w/Gabric strap, 18" extension tubing 19 oz  Item #[id number] 07/30/14   [name], MD  Incontinence Supply Disposable (PREVAIL BREEZERS MEDIUM) MISC pkg of 16- size medium 32" to 44"  Breathable cloth-like outer fabric (can't use the plastic outer surgace  Item # [id number] 07/30/14   [name], MD  LORazepam (ATIVAN) 1 MG tablet Take 1 tab po tid prn for anxiety 04/10/14   [name], MD  metoprolol tartrate (LOPRESSOR) 25 MG tablet Take 25 mg by mouth 2 (two) times daily. Palpitaitons, tachycardia 07/16/14   [name], MD  NON FORMULARY Bard Leg Bag Extension tubing w/Connector 18", Sterile, latex-free  Item# [id number]    Historical Provider, MD  NON FORMULARY Colorplast Freedom Cath Latex Self-Adhering Male External Catheter 24mm Diameter Intermediate  Item# [id number]    Historical Provider, MD  Nutritional Supplements (FEEDING SUPPLEMENT, JEVITY 1.5 CAL/FIBER,) LIQD Place 1,000 mLs into feeding tube 5 (five) times daily. 08/28/14   [name], MD  Nutritional Supplements (FEEDING SUPPLEMENT, OSMOLITE 1.5 CAL,) LIQD Place 1,000 mLs into feeding tube continuous. Osmolite 1.5 X 5 cans/day. Pump rate @100  ml/hr X 12 hrs a day 09/04/14   [name], MD  nystatin cream (MYCOSTATIN) Apply 1 application topically 2 (two) times daily as needed for dry skin. 12/01/13   [name], MD  OLANZapine (ZYPREXA) 5 MG tablet Take 5 mg by mouth at bedtime.     Historical Provider, MD  Omeprazole-Sodium Bicarbonate (ZEGERID) 20-1100 MG CAPS capsule Take 1 capsule by mouth daily before breakfast.    Historical Provider, MD  Ostomy Supplies (PROTECTIVE BARRIER WIPES) [place] 1-1/4" X 3"  Item #[id number] 07/30/14   [name], MD  oxybutynin [facility]) 5 MG/5ML syrup  06/14/14   Historical Provider, MD  PARoxetine (PAXIL) 30 MG tablet tape down per neuro phyciatrist- 15 mg 04/10/14   [name], MD  PRESCRIPTION MEDICATION G-tube    Historical Provider, MD  PRESCRIPTION MEDICATION Colorplast Freedom Cath Latex Self-Adhering Male External Catheter 37mm Diameter Intermediate  Item# [id number]    Historical Provider, MD  Probiotic Product (ADVANCED PROBIOTIC 10) CAPS 2 tablespoons    Historical Provider, MD  sucralfate (CARAFATE) 1 G tablet 1 tablet twice daily via PEG 01/29/14   [name], MD   BP 106/51 mmHg  Pulse 89  Temp(Src) 100.6 F (38.1 C) (Rectal)  Resp 24  Wt 91 lb (41.277 kg)  SpO2 97% Physical Exam  Constitutional:  Chronically ill, paraplegic. Emaciated   HENT:  Head: Normocephalic.  MM dry. Dry blood in roof of mouth   Eyes: Conjunctivae are normal. Pupils are equal, round, and reactive to light.  Neck: Normal range of motion. Neck supple.  Cardiovascular: Normal rate, regular rhythm and normal heart sounds.   Pulmonary/Chest: Effort normal.  Diminished breath sounds bilaterally.   Abdominal: Soft. Bowel sounds are normal. He exhibits no distension. There is no tenderness. There is no rebound.  G tube in place, nontender   Musculoskeletal: Normal range of motion. He exhibits no edema or tenderness.  Neurological: He is alert.  Unable to answer questions. Contracted   Skin: Skin is warm and dry.  Psychiatric:  Unable   Nursing note and vitals reviewed.   ED Course  Procedures (including critical care time) Labs Review Labs Reviewed  COMPREHENSIVE METABOLIC PANEL - Abnormal; Notable for the following:    Glucose, Bld 101  (*)    Total Bilirubin 0.2 (*)    All other components within normal limits  CULTURE, BLOOD (ROUTINE X 2)  CULTURE, BLOOD (ROUTINE X 2)  URINE CULTURE  CBC WITH DIFFERENTIAL  OCCULT BLOOD X 1 CARD TO LAB, STOOL  LACTIC ACID, PLASMA  URINALYSIS, ROUTINE W REFLEX MICROSCOPIC  I-STAT CG4 LACTIC ACID, ED    Imaging Review Dg Chest 2 View  09/05/2014   CLINICAL DATA:  Cough for 3 days ; mother states onset following barium swallow examination on Monday  EXAM: CHEST  2 VIEW  COMPARISON:  Report of the barium swallow examination of September 03, 2014  FINDINGS: The lungs are adequately inflated. No barium is demonstrated in the tracheal or bronchial passages. There is small right pleural effusion. There is right basilar pneumonia posteriorly. The heart and pulmonary vascularity are normal. There is a tubular structure which extends from the level of the right pulmonary apex inferiorly to the thoracolumbar junction. There are posterior fusion devices and Harrington rods from the cervical through the visualized portions of the lumbar spine.  IMPRESSION: There is right lower lobe pneumonia with small right pleural effusion. No radiodense barium is demonstrated.   Electronically Signed   By: Carlyle Mcelrath  [place]   On: 09/05/2014 16:48     EKG Interpretation None      MDM   Final diagnoses:  Cough    [name] is a 28 y.o. male here with possible aspiration. Febrile here, borderline hypotensive. Concerned for sepsis. Will do sepsis workup. Will likely admit.    6:09 PM CXR showed pneumonia. BP improved with NS. Ceftriaxone/azithro ordered. Lactate nl. Will admit to med/surg.    Wandra Arthurs, MD 09/05/14 [phone]

## 2014-09-05 NOTE — ED Notes (Signed)
Called x one with no response from lobby.

## 2014-09-05 NOTE — Telephone Encounter (Signed)
Caller name:[facility] Relation to PH:[facility] Call back number:[phone] Pharmacy:  Reason for call: would like for you to give her a call regarding pt's appt, states she needs the office notes from his appt with Dr. Charlett Blake. Fax to [phone]

## 2014-09-05 NOTE — Telephone Encounter (Signed)
Opened in error edh/can

## 2014-09-05 NOTE — ED Notes (Signed)
Called x 3 with no response from lobby.

## 2014-09-05 NOTE — ED Notes (Signed)
Mother reports pt with cough and blood oozing from mouth-s/s started after swallow study at [facility] 2 days ago-pt with hx of aspiration

## 2014-09-05 NOTE — Telephone Encounter (Signed)
Patient Information:  Caller Name: [name]  Phone: [phone]  Patient: [name], [name]  Gender: Male  DOB: April 26, 1986  Age: 28 Years  PCP: [name] [facility])  Office Follow Up:  Does the office need to follow up with this patient?: No  Instructions For The Office: N/A  RN Note:  On Cefdinir since 08/30/14.  Last stool was Sunday evening, 09/02/14. Frequent cough present.  Suctioning thick, pale-yellow and blood tinged mucus.  Concerned about possible aspiration since swallow study 09/03/14 was stopped after 4 tests.  Lost #24 lbs in past year.  [facility] care comes weekly; next visit scheduled for 09/06/14.  Pt indicated malaise, agitated at times, and difficulty breathing.  Upset and agitated since 09/04/14. Mild wheezing present.  Respiratory rate counted as 48 rpm; Mother had great difficulty counting due to squeling/crying. Audible tachypnea. Pulse Ox 96-97%, Pulse 86-106 bpm.  Has not use Albuterol neb for couhg/wheezing.  Has transportation (wheel chair van.) Call provider now for severe chest pain per Chest Pain. Discussed with Gay/office nurse who approved sending to ED now. Verbalized understanding.  Symptoms  Reason For Call & Symptoms: Bright red blood seen in BM 3-4 times since 08/31/14-09/02/14,  black material resembling dried blood on G-tube dressing and spots of blood on roof of mouth 09/04/14.  Reviewed Health History In EMR: Yes  Reviewed Medications In EMR: Yes  Reviewed Allergies In EMR: Yes  Reviewed Surgeries / Procedures: Yes  Date of Onset of Symptoms: 08/31/2014  Guideline(s) Used:  Breathing Difficulty  Chest Pain  Disposition Per Guideline:   Go to ED Now  Reason For Disposition Reached:   Severe chest pain  Advice Given:  Call Back If:  You become worse.  Patient Will Follow Care Advice:  YES

## 2014-09-05 NOTE — ED Notes (Signed)
Called x 2 with no response from lobby.

## 2014-09-05 NOTE — Evaluation (Signed)
EDP: [facility] Reason for Admission: Aspiration/CAP  Pt with baseline quadriplegia. Has had multiple episodes of aspiration PNA in the past. Recent swallow study showed silent aspiration. + cough and increased WOB per mom. tmax 100.6, BP 100s. Satting around 97% on RA. Lactate WNL. CBC and BMET WNL. CXR shows RLL PNA and small R pleural effusion. Started on rocephin and azithromycin. Accepted to med surg bed at [facility]

## 2014-09-06 ENCOUNTER — Ambulatory Visit (HOSPITAL_BASED_OUTPATIENT_CLINIC_OR_DEPARTMENT_OTHER): Payer: Medicare Other

## 2014-09-06 ENCOUNTER — Encounter (HOSPITAL_COMMUNITY): Payer: Self-pay | Admitting: Internal Medicine

## 2014-09-06 ENCOUNTER — Inpatient Hospital Stay (HOSPITAL_COMMUNITY): Payer: Medicare Other

## 2014-09-06 DIAGNOSIS — R1310 Dysphagia, unspecified: Secondary | ICD-10-CM | POA: Diagnosis present

## 2014-09-06 DIAGNOSIS — S00512A Abrasion of oral cavity, initial encounter: Secondary | ICD-10-CM

## 2014-09-06 DIAGNOSIS — K625 Hemorrhage of anus and rectum: Secondary | ICD-10-CM

## 2014-09-06 DIAGNOSIS — R933 Abnormal findings on diagnostic imaging of other parts of digestive tract: Secondary | ICD-10-CM

## 2014-09-06 DIAGNOSIS — G809 Cerebral palsy, unspecified: Secondary | ICD-10-CM

## 2014-09-06 DIAGNOSIS — J690 Pneumonitis due to inhalation of food and vomit: Principal | ICD-10-CM

## 2014-09-06 DIAGNOSIS — R1319 Other dysphagia: Secondary | ICD-10-CM | POA: Diagnosis present

## 2014-09-06 DIAGNOSIS — Z931 Gastrostomy status: Secondary | ICD-10-CM

## 2014-09-06 LAB — GLUCOSE, CAPILLARY
Glucose-Capillary: 101 mg/dL — ABNORMAL HIGH (ref 70–99)
Glucose-Capillary: 102 mg/dL — ABNORMAL HIGH (ref 70–99)
Glucose-Capillary: 103 mg/dL — ABNORMAL HIGH (ref 70–99)
Glucose-Capillary: 126 mg/dL — ABNORMAL HIGH (ref 70–99)

## 2014-09-06 LAB — COMPREHENSIVE METABOLIC PANEL
ALT: 12 U/L (ref 0–53)
AST: 18 U/L (ref 0–37)
Albumin: 2.9 g/dL — ABNORMAL LOW (ref 3.5–5.2)
Alkaline Phosphatase: 59 U/L (ref 39–117)
Anion gap: 14 (ref 5–15)
BUN: 6 mg/dL (ref 6–23)
CO2: 22 mEq/L (ref 19–32)
Calcium: 8.6 mg/dL (ref 8.4–10.5)
Chloride: 109 mEq/L (ref 96–112)
Creatinine, Ser: 0.43 mg/dL — ABNORMAL LOW (ref 0.50–1.35)
GFR calc Af Amer: >90 mL/min (ref >90)
GFR calc non Af Amer: >90 mL/min (ref >90)
Glucose, Bld: 89 mg/dL (ref 70–99)
Potassium: 3.9 mEq/L (ref 3.7–5.3)
Sodium: 145 mEq/L (ref 137–147)
Total Bilirubin: 0.2 mg/dL — ABNORMAL LOW (ref 0.3–1.2)
Total Protein: 6.1 g/dL (ref 6.0–8.3)

## 2014-09-06 LAB — CBC WITH DIFFERENTIAL/PLATELET
Basophils Absolute: 0 10*3/uL (ref 0.0–0.1)
Basophils Relative: 0 % (ref 0–1)
Eosinophils Absolute: 0.4 10*3/uL (ref 0.0–0.7)
Eosinophils Relative: 7 % — ABNORMAL HIGH (ref 0–5)
HCT: 36.3 % — ABNORMAL LOW (ref 39.0–52.0)
Hemoglobin: 11.9 g/dL — ABNORMAL LOW (ref 13.0–17.0)
Lymphocytes Relative: 29 % (ref 12–46)
Lymphs Abs: 1.7 10*3/uL (ref 0.7–4.0)
MCH: 29.8 pg (ref 26.0–34.0)
MCHC: 32.8 g/dL (ref 30.0–36.0)
MCV: 90.8 fL (ref 78.0–100.0)
Monocytes Absolute: 0.6 10*3/uL (ref 0.1–1.0)
Monocytes Relative: 10 % (ref 3–12)
Neutro Abs: 3.3 10*3/uL (ref 1.7–7.7)
Neutrophils Relative %: 54 % (ref 43–77)
Platelets: 165 10*3/uL (ref 150–400)
RBC: 4 MIL/uL — ABNORMAL LOW (ref 4.22–5.81)
RDW: 13.4 % (ref 11.5–15.5)
WBC: 6 10*3/uL (ref 4.0–10.5)

## 2014-09-06 LAB — URINE CULTURE
Colony Count: NO GROWTH
Culture: NO GROWTH

## 2014-09-06 MED ORDER — IOHEXOL 300 MG/ML  SOLN
50.0000 mL | Freq: Once | INTRAMUSCULAR | Status: AC | PRN
  Administered 2014-09-06: 50 mL via ORAL

## 2014-09-06 MED ORDER — DIVALPROEX SODIUM 125 MG PO CPSP
625.0000 mg | ORAL_CAPSULE | Freq: Every day | ORAL | Status: DC
  Administered 2014-09-06 – 2014-09-07 (×3): 625 mg via ORAL
  Filled 2014-09-06 (×4): qty 5

## 2014-09-06 MED ORDER — DEXTROSE-NACL 5-0.9 % IV SOLN
INTRAVENOUS | Status: AC
  Administered 2014-09-06: 01:00:00 via INTRAVENOUS

## 2014-09-06 MED ORDER — DIAZEPAM 2 MG PO TABS
2.0000 mg | ORAL_TABLET | Freq: Every day | ORAL | Status: DC
  Administered 2014-09-06 – 2014-09-07 (×2): 2 mg
  Filled 2014-09-06 (×2): qty 1

## 2014-09-06 MED ORDER — DANTROLENE SODIUM 25 MG PO CAPS
25.0000 mg | ORAL_CAPSULE | Freq: Two times a day (BID) | ORAL | Status: DC
  Administered 2014-09-06: 25 mg via ORAL
  Filled 2014-09-06 (×3): qty 1

## 2014-09-06 MED ORDER — METOPROLOL TARTRATE 25 MG PO TABS
25.0000 mg | ORAL_TABLET | Freq: Two times a day (BID) | ORAL | Status: DC
  Administered 2014-09-06 – 2014-09-07 (×2): 25 mg
  Filled 2014-09-06 (×5): qty 1

## 2014-09-06 MED ORDER — GUAIFENESIN 100 MG/5ML PO SOLN
25.0000 mL | Freq: Two times a day (BID) | ORAL | Status: DC
  Administered 2014-09-06 – 2014-09-08 (×5): 500 mg via ORAL
  Filled 2014-09-06 (×7): qty 30

## 2014-09-06 MED ORDER — METOPROLOL TARTRATE 25 MG PO TABS
25.0000 mg | ORAL_TABLET | Freq: Two times a day (BID) | ORAL | Status: DC
  Administered 2014-09-06: 25 mg via ORAL
  Filled 2014-09-06 (×3): qty 1

## 2014-09-06 MED ORDER — METOPROLOL TARTRATE 25 MG/10 ML ORAL SUSPENSION
25.0000 mg | Freq: Two times a day (BID) | ORAL | Status: DC
  Filled 2014-09-06: qty 10

## 2014-09-06 MED ORDER — ACETAMINOPHEN 650 MG RE SUPP: 650.0000 mg | RECTAL | Status: DC | PRN

## 2014-09-06 MED ORDER — PANTOPRAZOLE SODIUM 40 MG IV SOLR
40.0000 mg | Freq: Two times a day (BID) | INTRAVENOUS | Status: DC
  Administered 2014-09-06 – 2014-09-08 (×6): 40 mg via INTRAVENOUS
  Filled 2014-09-06 (×7): qty 40

## 2014-09-06 MED ORDER — LORAZEPAM 1 MG PO TABS
1.0000 mg | ORAL_TABLET | Freq: Three times a day (TID) | ORAL | Status: DC | PRN
  Filled 2014-09-06: qty 1

## 2014-09-06 MED ORDER — ONDANSETRON HCL 4 MG PO TABS: 4.0000 mg | ORAL_TABLET | Freq: Four times a day (QID) | ORAL | Status: DC | PRN

## 2014-09-06 MED ORDER — FLEET ENEMA 7-19 GM/118ML RE ENEM
1.0000 | ENEMA | Freq: Two times a day (BID) | RECTAL | Status: DC
  Administered 2014-09-07: 1 via RECTAL
  Filled 2014-09-06 (×2): qty 1

## 2014-09-06 MED ORDER — PAROXETINE HCL 30 MG PO TABS
15.0000 mg | ORAL_TABLET | Freq: Every day | ORAL | Status: DC
  Administered 2014-09-06 – 2014-09-07 (×3): 15 mg
  Filled 2014-09-06 (×4): qty 0.5

## 2014-09-06 MED ORDER — OLANZAPINE 5 MG PO TABS
5.0000 mg | ORAL_TABLET | Freq: Every day | ORAL | Status: DC
  Administered 2014-09-06 – 2014-09-07 (×3): 5 mg
  Filled 2014-09-06 (×4): qty 1

## 2014-09-06 MED ORDER — PROBIOTIC ACIDOPHILUS BIOBEADS PO CAPS: ORAL_CAPSULE | Freq: Every day | ORAL | Status: DC

## 2014-09-06 MED ORDER — SCOPOLAMINE 1 MG/3DAYS TD PT72
1.0000 | MEDICATED_PATCH | TRANSDERMAL | Status: DC
  Filled 2014-09-06: qty 1

## 2014-09-06 MED ORDER — OSMOLITE 1.5 CAL PO LIQD
220.0000 mL | Freq: Every day | ORAL | Status: DC
  Administered 2014-09-06 – 2014-09-08 (×5): 220 mL
  Filled 2014-09-06 (×15): qty 237

## 2014-09-06 MED ORDER — ALBUTEROL SULFATE (2.5 MG/3ML) 0.083% IN NEBU: 2.5000 mg | INHALATION_SOLUTION | RESPIRATORY_TRACT | Status: DC | PRN

## 2014-09-06 MED ORDER — ACETAMINOPHEN 650 MG RE SUPP: 650.0000 mg | Freq: Four times a day (QID) | RECTAL | Status: DC | PRN

## 2014-09-06 MED ORDER — DANTROLENE SODIUM 25 MG PO CAPS: 50.0000 mg | ORAL_CAPSULE | Freq: Two times a day (BID) | ORAL | Status: DC

## 2014-09-06 MED ORDER — DANTROLENE SODIUM 25 MG PO CAPS
50.0000 mg | ORAL_CAPSULE | Freq: Every day | ORAL | Status: DC
  Administered 2014-09-06 – 2014-09-07 (×2): 50 mg
  Filled 2014-09-06 (×3): qty 2

## 2014-09-06 MED ORDER — JEVITY 1.2 CAL PO LIQD: 1000.0000 mL | ORAL | Status: DC

## 2014-09-06 MED ORDER — ACETAMINOPHEN 160 MG/5ML PO SOLN: 650.0000 mg | Freq: Four times a day (QID) | ORAL | Status: DC | PRN

## 2014-09-06 MED ORDER — IOHEXOL 300 MG/ML  SOLN
100.0000 mL | Freq: Once | INTRAMUSCULAR | Status: AC | PRN
  Administered 2014-09-06: 100 mL via INTRAVENOUS

## 2014-09-06 MED ORDER — DEXTROMETHORPHAN POLISTIREX 30 MG/5ML PO LQCR
30.0000 mg | Freq: Every evening | ORAL | Status: DC | PRN
  Filled 2014-09-06: qty 5

## 2014-09-06 MED ORDER — DIVALPROEX SODIUM 125 MG PO CPSP
250.0000 mg | ORAL_CAPSULE | Freq: Every day | ORAL | Status: DC
  Administered 2014-09-06 – 2014-09-08 (×3): 250 mg via ORAL
  Filled 2014-09-06 (×3): qty 2

## 2014-09-06 MED ORDER — ONDANSETRON HCL 4 MG/2ML IJ SOLN: 4.0000 mg | Freq: Four times a day (QID) | INTRAMUSCULAR | Status: DC | PRN

## 2014-09-06 MED ORDER — DANTROLENE SODIUM 25 MG PO CAPS
25.0000 mg | ORAL_CAPSULE | Freq: Two times a day (BID) | ORAL | Status: DC
  Administered 2014-09-06 – 2014-09-08 (×4): 25 mg
  Filled 2014-09-06 (×6): qty 1

## 2014-09-06 MED ORDER — DANTROLENE SODIUM 25 MG PO CAPS
50.0000 mg | ORAL_CAPSULE | Freq: Every day | ORAL | Status: DC
  Administered 2014-09-06: 50 mg via ORAL
  Filled 2014-09-06 (×2): qty 2

## 2014-09-06 MED ORDER — RISAQUAD PO CAPS
1.0000 | ORAL_CAPSULE | Freq: Every day | ORAL | Status: DC
  Administered 2014-09-06 – 2014-09-08 (×3): 1 via ORAL
  Filled 2014-09-06 (×3): qty 1

## 2014-09-06 MED ORDER — SODIUM CHLORIDE 0.9 % IV SOLN
INTRAVENOUS | Status: DC
  Administered 2014-09-06: via INTRAVENOUS

## 2014-09-06 MED ORDER — SODIUM CHLORIDE 0.9 % IV SOLN
3.0000 g | Freq: Four times a day (QID) | INTRAVENOUS | Status: DC
  Administered 2014-09-06 – 2014-09-08 (×10): 3 g via INTRAVENOUS
  Filled 2014-09-06 (×13): qty 3

## 2014-09-06 MED ORDER — DIAZEPAM 2 MG PO TABS
2.0000 mg | ORAL_TABLET | Freq: Every day | ORAL | Status: DC
  Administered 2014-09-06: 2 mg via ORAL
  Filled 2014-09-06: qty 1

## 2014-09-06 MED ORDER — ACETAMINOPHEN 325 MG PO TABS: 650.0000 mg | ORAL_TABLET | Freq: Four times a day (QID) | ORAL | Status: DC | PRN

## 2014-09-06 MED ORDER — OXYBUTYNIN CHLORIDE 5 MG/5ML PO SYRP
5.0000 mg | ORAL_SOLUTION | Freq: Two times a day (BID) | ORAL | Status: DC
  Administered 2014-09-06 – 2014-09-07 (×4): 5 mg
  Filled 2014-09-06 (×6): qty 5

## 2014-09-06 NOTE — Progress Notes (Signed)
INITIAL NUTRITION ASSESSMENT  DOCUMENTATION CODES Per approved criteria  -Underweight   INTERVENTION: Provide 220 ml of Osmolite 1.5 via PEG 5 times daily. This will provide 1650 kcal, 69 grams of protein, and 838 ml of water.   When IV fluids are discontinued add 100 ml flushes TID.   RD to continue to monitor  NUTRITION DIAGNOSIS: Inadequate oral intake related to inability to eat as evidenced by NPO status and G-tube dependence.   Goal: Pt to meet >/= 90% of their estimated nutrition needs   Monitor:  TF initiation and tolerance, weight trend, labs  Reason for Assessment: Malnutrition Screening Tool, score of 3 and Consult for Enteral/TF Management  28 y.o. male  Admitting Dx: Aspiration pneumonia  ASSESSMENT: 28 y.o. male with history of cerebral palsy was brought to the ER after patient was found to have increasing cough productive of sputum. Patient also was I mild shortness of breath. In addition patient's mother noted that patient has been examined rectal bleeding off and on lasting 3 days. When suctioning patient's mouth patient mother found the patient also had some blood on the palate. In the ER chest x-ray showed possible pneumonia and patient was febrile.  RD met with pt's mother at bedside who reports that patient has lost 25 lbs in the past year- 22% weight loss. Mom reports that pt has had frequent issues with TF regimen making it difficult for patient to meet 100% of energy needs. Pt was not able to tolerate full volume with Osmolite 1.2 and was getting full after 3 cans of Jevity 1.5. Mom reports that patient has been able to tolerate up to 240 ml of Osmolite 1.5. She also reports that patient seems to cough more with continuous feed which she has tried Osmolite 1.5 @ 40 ml/hr overnight.   Nutrition Focused Physical Exam:  Subcutaneous Fat:  Orbital Region: wnl Upper Arm Region: mild wasting Thoracic and Lumbar Region: NA  Muscle:  Temple Region: mild  wasting Clavicle Bone Region: mild wasting Clavicle and Acromion Bone Region: mild wasting Scapular Bone Region: NA Dorsal Hand: mild wasting Patellar Region: severe wasting Anterior Thigh Region: severe wasting Posterior Calf Region: severe wasting  Edema: none noted  Height: Ht Readings from Last 1 Encounters:  08/28/14 _0  (1.575 m)    Weight: Wt Readings from Last 1 Encounters:  09/05/14 91 lb (41.277 kg)    Ideal Body Weight: 112 lbs  % Ideal Body Weight: 81%  Wt Readings from Last 10 Encounters:  09/05/14 91 lb (41.277 kg)  08/28/14 91 lb (41.277 kg)  08/01/14 92 lb (41.731 kg)  07/24/14 91 lb 8 oz (41.504 kg)  05/26/14 95 lb (43.092 kg)  05/23/14 95 lb (43.092 kg)  05/17/14 95 lb (43.092 kg)  01/19/14 110 lb (49.896 kg)  12/11/13 107 lb (48.535 kg)  11/30/13 107 lb 7 oz (48.733 kg)    Usual Body Weight: 116 lbs  % Usual Body Weight: 78%  BMI:  Body mass index is 16.64 kg/(m^2).  Estimated Nutritional Needs: Kcal: 1657-9038 Protein: 62-74 grams Fluid: 1.5-1.65 L/day  Skin: intact  Diet Order: Diet NPO time specified Except for: Sips with Meds  EDUCATION NEEDS: -No education needs identified at this time   Intake/Output Summary (Last 24 hours) at 09/06/14 1626 Last data filed at 09/06/14 1540  Gross per 24 hour  Intake 1347.08 ml  Output    200 ml  Net 1147.08 ml    Last BM: 11/5   Labs:  Recent Labs Lab 09/05/14 1714 09/06/14 0544  NA 142 145  K 4.4 3.9  CL 102 109  CO2 30 22  BUN 11 6  CREATININE 0.50 0.43*  CALCIUM 9.2 8.6  GLUCOSE 101* 89    CBG (last 3)   Recent Labs  09/06/14 0049 09/06/14 0539 09/06/14 0815  GLUCAP 103* 102* 101*    Scheduled Meds: . acidophilus  1 capsule Oral Daily  . ampicillin-sulbactam (UNASYN) IV  3 g Intravenous Q6H  . antiseptic oral rinse  7 mL Mouth Rinse q12n4p  . chlorhexidine  15 mL Mouth Rinse BID  . dantrolene  25 mg Per Tube BID AC   And  . dantrolene  50 mg Per Tube  QHS  . diazepam  2-4 mg Per Tube QHS  . divalproex  250 mg Oral Daily  . divalproex  625 mg Oral QHS  . guaiFENesin  25 mL Oral BID  . metoprolol tartrate  25 mg Per Tube BID  . OLANZapine  5 mg Per Tube QHS  . oxybutynin  5 mg Per Tube BID  . pantoprazole (PROTONIX) IV  40 mg Intravenous Q12H  . PARoxetine  15 mg Per Tube QHS  . scopolamine  1 patch Transdermal Q72H  . [START ON 09/07/2014] sodium phosphate  1 enema Rectal BID    Continuous Infusions: . sodium chloride    . dextrose 5 % and 0.9% NaCl 75 mL/hr at 09/06/14 0200    Past Medical History  Diagnosis Date  . Cerebral palsy   . GERD (gastroesophageal reflux disease)   . Hyperthyroidism   . Incontinence of feces   . Palpitations   . Esophagitis 2011  . Dehydration 11/22/2013  . Depression with anxiety 08/01/2010    Qualifier: Diagnosis of  By: Nelson-Smith CMA (AAMA), Dottie    . Loss of weight 08/28/2014    Past Surgical History  Procedure Laterality Date  . Spine surgery  ,11/20/2010, 2011    for correction of severe contracturing spinal kyphosis.   . Eye surgery    . Ears tubes    . Hamstring released      to treat contractures.   . Baclofen trial    . Baslofen pump implant    . Spinal fusion    . G-tube insert  August 2006  . Spinal fusioncorrect 106 degree kyphosis    . Spinal fusion to correct 70 degree kyphosis  11-2010  . Tonsillectomy    . Peg placement  10/21/2011    Procedure: PERCUTANEOUS ENDOSCOPIC GASTROSTOMY (PEG) REPLACEMENT;  Surgeon: [name], MD;  Location: [facility];  Service: Endoscopy;  Laterality: N/A;  . Peg placement N/A 06/13/2013    Procedure: PERCUTANEOUS ENDOSCOPIC GASTROSTOMY (PEG) REPLACEMENT;  Surgeon: [name], MD;  Location: [facility];  Service: Endoscopy;  Laterality: N/A;  . Hip surgery      x2 , side     Pryor Ochoa RD, LDN Inpatient Clinical Dietitian Pager: [phone] After Hours Pager: [phone]

## 2014-09-06 NOTE — Telephone Encounter (Signed)
Faxed mds last office visit note

## 2014-09-06 NOTE — Consult Note (Signed)
[facility] Consult: 10:31 AM 09/06/2014  LOS: 1 day    Referring Provider: Dr Cruzita Lederer Primary Care Physician:  [name], MD Primary Gastroenterologist:  Dr. Hilarie Fredrickson, was to establish care 10/09/14.  Previously pt of [facility].      Reason for Consultation:  Bleeding from    HPI: [name] is a 28 y.o. male.  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  S/p spinal fusions which corrected his severe kyphosis but led to worsening dysphagia.  Hx post op ileus.  S/p PEG due to dysphagia and aspiration pna.  Dr Elsworth Soho had ordered strict NPO as of 10/1.  MBSS swallow eval of 10/2 confirmed progressive oral and pharyngeal dysphagia.  Recommended NPO except 1/3 tsp water post oral care. SLP advised mom that pt likely to aspirate his oral secretions even if NPO.   10 days of Cefdenir added 10/29 for staph infection of external right ear.  This has improved.   On Friday, Sat, Sunday mom saw some red blood per rectum, small to moderate volume with otherwise brown BMs.  Mom did digital exam and could not feel a hemorrhoid or mass. This has resolved and he was FOBT negative in ED yesterday.  On Monday, Tuesday saw some blackish discharge at [facility] site, this has resolved and Mom notes this occurs intermittently and she will treat it with a few days of Carafate via tube as she was advised to do by Dr Olevia Perches.  Developed cough after the MBSS 10/2 and at one point, while vigorously coughing, had red blood coming from the roof of his mouth. This has also resolved and pt says roof of mouth feels sore as does abdomen.  Normal bowel pattern is soft, formed every other day.  He is able to tell caregivers when he needs to defecate and not having a lot of incontinence.  Generally tolerating tube feeds.  But mom not able to give  all necessary cans per day as he tends to feels full.  If she uses continuous pump to infuse feedings, pt coughs too much.   CT scan of abd/pelvis today with ? Rectal wall thickening.  RLL consolidation noted but not completely seen. On Unasyn for aspiration pneumonia.   Past Medical History  Diagnosis Date  . Cerebral palsy   . GERD (gastroesophageal reflux disease)   . Anxiety   . Depression   . Thyroid disease     hyper  . Incontinence of feces   . Palpitations   . Esophagitis   . Dehydration 11/22/2013  . Depression with anxiety 08/01/2010    Qualifier: Diagnosis of  By: Nelson-Smith CMA (AAMA), Dottie    . Loss of weight 08/28/2014    Past Surgical History  Procedure Laterality Date  . Spine surgery  ,11/20/2010, 2011  . Eye surgery    . Ears tubes    . Hamstring released    . Baclofen trial    . Baslofen pump implant    . Spinal fusion    . G-tube insert  August 2006  .  Spinal fusioncorrect 106 degree kyphosis    . Spinal fusion to correct 70 degree kyphosis  11-2010  . Tonsillectomy    . Peg placement  10/21/2011    Procedure: PERCUTANEOUS ENDOSCOPIC GASTROSTOMY (PEG) REPLACEMENT;  Surgeon: [name], MD;  Location: [facility];  Service: Endoscopy;  Laterality: N/A;  . Peg placement N/A 06/13/2013    Procedure: PERCUTANEOUS ENDOSCOPIC GASTROSTOMY (PEG) REPLACEMENT;  Surgeon: [name], MD;  Location: [facility];  Service: Endoscopy;  Laterality: N/A;  . Hip surgery      x2 , side     Prior to Admission medications   Medication Sig Start Date End Date Taking? Authorizing Provider  bacitracin 500 UNIT/GM ointment Apply 1 application topically 2 (two) times daily. 08/28/14  Yes [name], MD  cefdinir (OMNICEF) 250 MG/5ML suspension Take 5 mLs (250 mg total) by mouth 2 (two) times daily. 6cc down peg tube bid X 10 days 07/16/14  Yes [name], MD  dantrolene (DANTRIUM) 50 MG capsule Take 1 capsule (50 mg total) by mouth 2 (two) times daily. 1 tab bid  and 2 tab at qhs Patient taking differently: Take 50 mg by mouth 3 (three) times daily.  08/28/14  Yes Meredith Staggers, MD  diazepam (VALIUM) 2 MG tablet Take 1-2 tablets (2-4 mg total) by mouth at bedtime. 08/28/14  Yes Meredith Staggers, MD  Lactobacillus (PROBIOTIC ACIDOPHILUS PO) Place 30 mLs into feeding tube daily.   Yes Historical Provider, MD  LORazepam (ATIVAN) 1 MG tablet Take 1 tab po tid prn for anxiety 04/10/14  Yes [name], MD  metoprolol tartrate (LOPRESSOR) 25 MG tablet Take 25 mg by mouth 2 (two) times daily. Palpitaitons, tachycardia 07/16/14  Yes [name], MD  OLANZapine (ZYPREXA) 5 MG tablet Take 5 mg by mouth at bedtime.   Yes Historical Provider, MD  Omeprazole-Sodium Bicarbonate (ZEGERID) 20-1100 MG CAPS capsule Take 1 capsule by mouth daily before breakfast.   Yes Historical Provider, MD  oxybutynin (DITROPAN) 5 MG/5ML syrup Take 5 mg by mouth 2 (two) times daily.  06/14/14  Yes Historical Provider, MD  PARoxetine (PAXIL) 10 MG tablet Place 15 mg into feeding tube at bedtime.   Yes Historical Provider, MD  AMBULATORY NON FORMULARY MEDICATION Medication Name: MIC gastrostomy/bolus feeding tube 24 French Part number 0110-24. #2 and 10 cc lurer lock syringe #2 Dx: 07/16/14   [name], MD  clotrimazole-betamethasone (LOTRISONE) cream Apply 1 application topically 2 (two) times daily. 03/27/14   [name], MD  divalproex (DEPAKOTE SPRINKLE) 125 MG capsule Take 2 capsules in morning, 5 capsules at bedtime Patient taking differently: 250-625 mg. Take 2 capsules in morning, 5 capsules at bedtime 12/11/13   Cameron Sprang, MD  Feeding Tubes - Bags (FLEXIFLO FEEDING BAG/PUMP) MISC 1 Units by Does not apply route continuous. 05/28/14   [name], MD  Feeding Tubes - Pump MISC 1 Units by Does not apply route continuous. 05/28/14   [name], MD  Feeding Tubes - Sets [facility] EPUMP SET 1000ML) MISC 30 day supply of [place] PUmp set with flush bag 05/28/14    [name], MD  Incontinence Supplies (BARD LEG BAG STRAPS/FABRIC) MISC Bard Dispoz-a-Bag leg bag w/flip flo Valve, sterile, w/Gabric strap, 18" extension tubing 19 oz  Item #[id number] 07/30/14   [name], MD  Incontinence Supply Disposable (PREVAIL BREEZERS MEDIUM) MISC pkg of 16- size medium 32" to 44"  Breathable cloth-like  outer fabric (can't use the plastic outer surgace  Item # [id number] 07/30/14   [name], MD  NON FORMULARY Bard Leg Bag Extension tubing w/Connector 18", Sterile, latex-free  Item# [id number]    Historical Provider, MD  NON FORMULARY Colorplast Freedom Cath Latex Self-Adhering Male External Catheter 85mm Diameter Intermediate  Item# [id number]    Historical Provider, MD  Nutritional Supplements (FEEDING SUPPLEMENT, JEVITY 1.5 CAL/FIBER,) LIQD Place 1,000 mLs into feeding tube 5 (five) times daily. 08/28/14   [name], MD  Nutritional Supplements (FEEDING SUPPLEMENT, OSMOLITE 1.5 CAL,) LIQD Place 1,000 mLs into feeding tube continuous. Osmolite 1.5 X 5 cans/day. Pump rate @100  ml/hr X 12 hrs a day 09/04/14   [name], MD  nystatin cream (MYCOSTATIN) Apply 1 application topically 2 (two) times daily as needed for dry skin. 12/01/13   [name], MD  Ostomy Supplies (PROTECTIVE BARRIER WIPES) [place] 1-1/4" X 3"  Item #[id number] 07/30/14   [name], MD  PARoxetine (PAXIL) 30 MG tablet tape down per neuro phyciatrist- 15 mg 04/10/14   [name], MD  PRESCRIPTION MEDICATION G-tube    Historical Provider, MD  [place] Cath Latex Self-Adhering Male External Catheter 46mm Diameter Intermediate  Item# [id number]    Historical Provider, MD  Probiotic Product (ADVANCED PROBIOTIC 10) CAPS 2 tablets. 2 tablespoons    Historical Provider, MD  sucralfate (CARAFATE) 1 G tablet 1 tablet twice daily via PEG 01/29/14   [name], MD    Scheduled Meds: . ampicillin-sulbactam (UNASYN) IV  3 g Intravenous Q6H  . antiseptic oral  rinse  7 mL Mouth Rinse q12n4p  . chlorhexidine  15 mL Mouth Rinse BID  . dantrolene  25 mg Oral BID AC   And  . dantrolene  50 mg Oral QHS  . diazepam  2-4 mg Oral QHS  . divalproex  250 mg Oral Daily  . divalproex  625 mg Oral QHS  . metoprolol tartrate  25 mg Oral BID  . OLANZapine  5 mg Per Tube QHS  . oxybutynin  5 mg Per Tube BID  . pantoprazole (PROTONIX) IV  40 mg Intravenous Q12H  . PARoxetine  15 mg Per Tube QHS   Infusions: . dextrose 5 % and 0.9% NaCl 75 mL/hr at 09/06/14 0200   PRN Meds: acetaminophen **OR** acetaminophen, LORazepam, ondansetron **OR** ondansetron (ZOFRAN) IV   Allergies as of 09/05/2014 - Review Complete 09/05/2014  Allergen Reaction Noted  . Ambien [zolpidem tartrate] Nausea Only 11/30/2013  . Codeine  12/08/2013  . Baclofen Anxiety 04/01/2014  . Diazepam Anxiety 04/01/2014  . Sulfonamide derivatives Rash 05/21/2008    Family History  Problem Relation Age of Onset  . Asthma Mother   . Hyperlipidemia Mother   . COPD Mother   . Other Mother     bronchial stasis/ABPA  . Cancer Maternal Grandmother 31    breast  . Hyperlipidemia Maternal Grandmother   . Hypertension Maternal Grandmother   . Cancer Maternal Grandfather     prostate  . Heart disease Paternal Grandfather     CHF  . Osteoporosis Paternal Grandmother   . Arthritis Paternal Grandmother     rheumatoid    History   Social History  . Marital Status: Single    Spouse Name: N/A    Number of Children: 0  . Years of Education: N/A   Occupational History  . disbaled    Social History Main Topics  . Smoking status:  Never Smoker   . Smokeless tobacco: Never Used  . Alcohol Use: No  . Drug Use: No  . Sexual Activity: No   Other Topics Concern  . Not on file   Social History Narrative    REVIEW OF SYSTEMS: Constitutional:  Weight drop of 24 # in one year ENT:  No nose bleeds Pulm:  Per HPI CV:  No palpitations, no LE edema.  GU:  No hematuria, no frequency GI:   Per HPI Heme:  Per HPI   Transfusions:  none Neuro:  No headaches, no peripheral tingling or numbness Derm:  No itching, no rash or sores.  Endocrine:  No sweats or chills.  No polyuria or dysuria Immunization:  Up to date on all vaccines  Travel:  None beyond local counties in last few months.    PHYSICAL EXAM: Vital signs in last 24 hours: Filed Vitals:   09/06/14 0542  BP: 98/59  Pulse: 79  Temp: 99.7 F (37.6 C)  Resp: 16   Wt Readings from Last 3 Encounters:  09/05/14 91 lb (41.277 kg)  08/28/14 91 lb (41.277 kg)  08/01/14 92 lb (41.731 kg)    General: cachectic, contractured but comfortable and nontoxic appearing white male. Head:  No swelling  Eyes:  no Icterus or pallor Ears:  There is some remnant minimally erythematous and crusty lesion on the right ear  Nose:   No congestion or discharge Mouth:  There is some erythema, peeling slightly macerated area at the roof of the mouth Neck:  No mass or JVD. Lungs:  Lungs are clear to auscultation in front. He does have a very wet cough and appears to have retained secretions in the upper throat Heart: regular rate rhythm. No murmurs rubs gallops Abdomen:  . Thin, soft, active bowel sounds, nondistended. PEG site on the left is not erythematous or bloody. There is no guarding or rebound but patient nods confirmation that there is discomfort with palpation.   Rectal: deferred   Musc/Skeltl: contractures of the upper extremities more so than the lower extremities Extremities:  No pedal edema, no cyanosis. Feet are warm  Neurologic:  Patient is alert and able to respond to questions with simple answers. He can follow commands. Limb strength and full neurologic evaluation was not performed Skin:  No telangiectasias, no rash. Skin is pale, not so much from anemia as from likely lack of sun exposure over a lifetime Tattoos:  none Nodes:  No cervical adenopathy   Psych:  Quiet, not agitated.  Intake/Output from previous  day: 11/04 0701 - 11/05 0700 In: 572.1 [I.V.:192.1; IV Piggyback:350] Out: -  Intake/Output this shift:    LAB RESULTS:  Recent Labs  09/05/14 1714 09/06/14 0544  WBC 8.0 6.0  HGB 13.3 11.9*  HCT 40.4 36.3*  PLT 202 165   BMET Lab Results  Component Value Date   NA 145 09/06/2014   NA 142 09/05/2014   NA 141 05/17/2014   K 3.9 09/06/2014   K 4.4 09/05/2014   K 4.1 05/17/2014   CL 109 09/06/2014   CL 102 09/05/2014   CL 99 05/17/2014   CO2 22 09/06/2014   CO2 30 09/05/2014   CO2 26 05/17/2014   GLUCOSE 89 09/06/2014   GLUCOSE 101* 09/05/2014   GLUCOSE 131* 05/17/2014   BUN 6 09/06/2014   BUN 11 09/05/2014   BUN 8 05/17/2014   CREATININE 0.43* 09/06/2014   CREATININE 0.50 09/05/2014   CREATININE 0.40* 05/17/2014   CALCIUM 8.6  09/06/2014   CALCIUM 9.2 09/05/2014   CALCIUM 9.7 05/17/2014   LFT  Recent Labs  09/05/14 1714 09/06/14 0544  PROT 7.5 6.1  ALBUMIN 3.6 2.9*  AST 21 18  ALT 13 12  ALKPHOS 74 59  BILITOT 0.2* 0.2*   PT/INR No results found for: INR, PROTIME Hepatitis Panel No results for input(s): HEPBSAG, HCVAB, HEPAIGM, HEPBIGM in the last 72 hours. C-Diff No components found for: CDIFF Lipase     Component Value Date/Time   LIPASE 19 11/29/2013 1203    Drugs of Abuse  No results found for: LABOPIA, COCAINSCRNUR, LABBENZ, AMPHETMU, THCU, LABBARB   RADIOLOGY STUDIES: Dg Chest 2 View  09/05/2014   CLINICAL DATA:  Cough for 3 days ; mother states onset following barium swallow examination on Monday  EXAM: CHEST  2 VIEW  COMPARISON:  Report of the barium swallow examination of September 03, 2014  FINDINGS: The lungs are adequately inflated. No barium is demonstrated in the tracheal or bronchial passages. There is small right pleural effusion. There is right basilar pneumonia posteriorly. The heart and pulmonary vascularity are normal. There is a tubular structure which extends from the level of the right pulmonary apex inferiorly to the  thoracolumbar junction. There are posterior fusion devices and Harrington rods from the cervical through the visualized portions of the lumbar spine.  IMPRESSION: There is right lower lobe pneumonia with small right pleural effusion. No radiodense barium is demonstrated.   Electronically Signed   By: David  Martinique   On: 09/05/2014 16:48   Ct Head Wo Contrast  09/06/2014   CLINICAL DATA:  Dysphagia.  Cerebral palsy.  EXAM: CT HEAD WITHOUT CONTRAST  TECHNIQUE: Contiguous axial images were obtained from the base of the skull through the vertex without intravenous contrast.  COMPARISON:  CT scan of November 29, 2013.  FINDINGS: Bony calvarium appears intact. 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 No mass effect or midline shift is noted. Ventricular size is within normal limits. There is no evidence of mass lesion, hemorrhage or acute infarction.  IMPRESSION: No acute intracranial abnormality seen.   Electronically Signed   By: Sabino Dick M.D.   On: 09/06/2014 09:51   Ct Abdomen Pelvis W Contrast  09/06/2014   CLINICAL DATA:  Intermittent rectal bleeding for 3 days. History of cerebral palsy.  EXAM: CT ABDOMEN AND PELVIS WITH CONTRAST  TECHNIQUE: Multidetector CT imaging of the abdomen and pelvis was performed using the standard protocol following bolus administration of intravenous contrast.  CONTRAST:  186mL OMNIPAQUE IOHEXOL 300 MG/ML  SOLN  COMPARISON:  11/23/2013  FINDINGS: Right lower lobe consolidation is partially visualized, as is a small right pleural effusion. 3 mm focus of calcification versus aspirated contrast in the right lower lobe is unchanged.  Evaluation of the abdomen is mildly limited by streak artifact from posterior spinal fusion hardware. Allowing for this, the liver, gallbladder, spleen, adrenal glands, right kidney, and pancreas have a grossly unremarkable appearance. 6 mm hypodensity in the lower pole of the left kidney is grossly unchanged and most likely represents  a cyst.  A percutaneous gastrostomy tube is in place. Oral contrast is present in the loops of nondilated small and large bowel without evidence of obstruction. Rectal wall thickening is questioned. The appendix is identified in the right lower quadrant and is unremarkable. No free fluid or enlarged lymph nodes are identified.  Bladder is more distended than on the prior study without evidence of wall thickening. Prostate is  upper limits of normal in size. Coarse calcifications in the right lower abdominal wall are unchanged. Moderate thoracolumbar dextroscoliosis is again seen with prior posterior thoracolumbar fusion noted.  IMPRESSION: 1. Small right pleural effusion and right lower lobe consolidation, incompletely visualized but suspicious for pneumonia. 2. Question rectal wall thickening. Otherwise no evidence of acute abnormality or mass in the abdomen or pelvis.   Electronically Signed   By: Logan Bores   On: 09/06/2014 10:05    ENDOSCOPIC STUDIES: 09/2010 EGD with foreign body removal, EGD with PEG placement  ENDOSCOPIC IMPRESSION:  1) Mild gastritis  2) Esophagitis in the distal esophagus  3) Otherwise normal examination  removal of old gastrostomy endoscopically  placement of 24FBS PUSH gastrostomy  RECOMMENDATIONS:  1) Anti-reflux regimen to be follow, continue Nexiem and Carafate slurry, resume oral feedingd when alert Pathology: 1. Esophagogastric junction, biopsy, :  - SQUAMOUS MUCOSA WITH INTRAEPITHELIAL EOSINOPHILS (UP TO 5 /HPF) AND EPITHELIAL CHANGES CONSISTENT WITH REFLUX RELATED INJURY - NEGATIVE FOR INTESTINAL METAPLASIA WITH GOBLET CELLS (BARRETT'S ESOPHAGUS) - NEGATIVE FOR MORPHOLOGICAL FEATURES OF EOSINOPHILIC ESOPHAGITIS - NO COLUMNAR/GLANDULAR MUCOSA PRESENT.  04/2006  EGD  Findings wide open reflux in Proximal Esophagus. Functioning PEG in Body.  Normal: Duodenal 2nd Portion.    IMPRESSION:   *  Blood per mouth, rectal "bleeding",  dark material from PEG site.  On exam there is a non-bleeding shallow, peeling, erythematous area on roof of mouth to explain the oral bleeding.  The dark material at peg site has resolved and is an intermittent problem. Rectal bleeding resolved but is a new issue, CT scan with ? rectal wall thickening.   *  Aspiration pna right LL.  Chronic progressive dysphagia and failed a swallow eval of 11/2.    *  S/p PEG.  Initial placement 2006.  Latest replacement 06/2013 due to malfunction. .   *  Cerebral palsy   PLAN:     *  Given resolution of his bleeding issues and active aspiration PNA, not sure we need to pursue investigative endoscopies at present.    Azucena Freed  09/06/2014, 10:31 AM Pager: [phone]

## 2014-09-06 NOTE — Progress Notes (Addendum)
PROGRESS NOTE  [name] WLN:[id number] DOB: 04/13/1986 DOA: 09/05/2014 PCP: [name], MD  HPI/Subjective: 28 yo male with cerebral palsy brought to the ED with increasing productive cough and shortness of breath.  Pt's mother noted that patient has had some intermittent rectal bleeding over the last 3 days. While suctioning pt's mouth pt's mother found the pt also had some blood on the palate.  On tube feeds for last few weeks. Swallow study done 2 days ago showed aspiration. PEG tube area showed some dark discharge. Pt was noticed to be having poor appetite over the last few weeks with loss of weight and patient's physical therapist was planned to get a CT head and neck and also was decreasing the dose of dantrolene.      No evidence of blood in mouth.  No discharge present around or in PEG tube.     Assessment/Plan: Aspiration pneumonia: Likely due to aspiration of secretions.  Mildly elevated temp 99.7-100.6.  CXR shows RLL pneumonia with small right pleural effusion.  Continue tube feeds.  Bld cultures show NGTD.  Continue IV Unasyn.     Will apply scopolamine patch to attempt to reduce secretions.     Dysphagia: Increased dysphagia and wt loss.  Failed swallowing study on 11/02.  CT of neck shows no mass or acute abnormality identified in the neck.  CT of head shows no acute intracranial abnormality seen.  Continue tube feeds.   Patient is NPO.  Rectal bleeding: No bleeding noted today.  CT of abdomen Question rectal wall thickening. Otherwise no evidence of acute abnormality or mass in the abdomen or pelvis. H/H 11.9/36.3 decreased from 13.3/40.4 on 11/04.  GI does not recommend pursuing endoscopy. Continue to Monitor CBC.    Cerebral palsy: Quadriplegic.  Cared for by mother and family friends.    DVT Prophylaxis:  SCDs  Code Status: DNR Family Communication: Spoke with pt's mother and care taker at bedside. Disposition Plan: Will return home when medically appropriate.     Consultants:  GI  Procedures:  None  Antibiotics:  Unasyn 3g IV q6h  Objective: Filed Vitals:   09/05/14 2012 09/05/14 2140 09/06/14 0542 09/06/14 1301  BP: 123/75 125/72 98/59 96/49   Pulse: 91  79 68  Temp: 100.1 F (37.8 C) 99.7 F (37.6 C) 99.7 F (37.6 C) 98.2 F (36.8 C)  TempSrc: Rectal Oral Oral Oral  Resp: 16 14 16 17   Weight:      SpO2: 97% 96% 96% 98%    Intake/Output Summary (Last 24 hours) at 09/06/14 1350 Last data filed at 09/06/14 1100  Gross per 24 hour  Intake 1347.08 ml  Output      0 ml  Net 1347.08 ml   Filed Weights   09/05/14 1614  Weight: 41.277 kg (91 lb)   Exam: General: Quadriplegic male, NAD  HEENT:  EOMI, Anicteic Sclera, MMM. No pharyngeal erythema or exudates  Neck: Supple, no JVD, no masses  Cardiovascular: RRR, S1 S2 auscultated, no rubs, murmurs or gallops.   Respiratory: Diffuse rhonchi, coarse breath sounds, no accessory muscle use  Abdomen: Soft, mildly tender, nondistended, + bowel sounds. PEG tube in place with minimal erythema, no cellulitis noted Extremities: warm dry without cyanosis clubbing or edema.  Neuro: Awake and alert.   Skin: Without rashes exudates or nodules.    Data Reviewed: Basic Metabolic Panel:  Recent Labs Lab 09/05/14 1714 09/06/14 0544  NA 142 145  K 4.4 3.9  CL 102 109  CO2 30 22  GLUCOSE 101* 89  BUN 11 6  CREATININE 0.50 0.43*  CALCIUM 9.2 8.6   Liver Function Tests:  Recent Labs Lab 09/05/14 1714 09/06/14 0544  AST 21 18  ALT 13 12  ALKPHOS 74 59  BILITOT 0.2* 0.2*  PROT 7.5 6.1  ALBUMIN 3.6 2.9*   CBC:  Recent Labs Lab 09/05/14 1714 09/06/14 0544  WBC 8.0 6.0  NEUTROABS 5.4 3.3  HGB 13.3 11.9*  HCT 40.4 36.3*  MCV 91.4 90.8  PLT 202 165   CBG:  Recent Labs Lab 09/06/14 0049 09/06/14 0539 09/06/14 0815  GLUCAP 103* 102* 101*    Recent Results (from the past 240 hour(s))  Blood culture (routine x 2)     Status: None (Preliminary result)    Collection Time: 09/05/14  5:30 PM  Result Value Ref Range Status   Specimen Description BLOOD RIGHT FOREARM  Final   Special Requests NONE BOTTLES DRAWN AEROBIC AND ANAEROBIC 5ML  Final   Culture  Setup Time   Final    09/05/2014 23:43 Performed at [facility]    Culture   Final           BLOOD CULTURE RECEIVED NO GROWTH TO DATE CULTURE WILL BE HELD FOR 5 DAYS BEFORE ISSUING A FINAL NEGATIVE REPORT Performed at [facility]    Report Status PENDING  Incomplete  Blood culture (routine x 2)     Status: None (Preliminary result)   Collection Time: 09/05/14  6:25 PM  Result Value Ref Range Status   Specimen Description BLOOD LEFT FA  Final   Special Requests BOTTLES DRAWN AEROBIC AND ANAEROBIC 5CC EACH  Final   Culture  Setup Time   Final    09/05/2014 23:44 Performed at [facility]    Culture   Final           BLOOD CULTURE RECEIVED NO GROWTH TO DATE CULTURE WILL BE HELD FOR 5 DAYS BEFORE ISSUING A FINAL NEGATIVE REPORT Performed at [facility]    Report Status PENDING  Incomplete     Studies: Dg Chest 2 View  09/05/2014   CLINICAL DATA:  Cough for 3 days ; mother states onset following barium swallow examination on Monday  EXAM: CHEST  2 VIEW  COMPARISON:  Report of the barium swallow examination of September 03, 2014  FINDINGS: The lungs are adequately inflated. No barium is demonstrated in the tracheal or bronchial passages. There is small right pleural effusion. There is right basilar pneumonia posteriorly. The heart and pulmonary vascularity are normal. There is a tubular structure which extends from the level of the right pulmonary apex inferiorly to the thoracolumbar junction. There are posterior fusion devices and Harrington rods from the cervical through the visualized portions of the lumbar spine.  IMPRESSION: There is right lower lobe pneumonia with small right pleural effusion. No radiodense barium is demonstrated.   Electronically Signed    By: David  Martinique   On: 09/05/2014 16:48   Ct Head Wo Contrast  09/06/2014   CLINICAL DATA:  Dysphagia.  Cerebral palsy.  EXAM: CT HEAD WITHOUT CONTRAST  TECHNIQUE: Contiguous axial images were obtained from the base of the skull through the vertex without intravenous contrast.  COMPARISON:  CT scan of November 29, 2013.  FINDINGS: Bony calvarium appears intact. 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 No mass effect or midline shift is noted. Ventricular size is within normal limits. There is no evidence  of mass lesion, hemorrhage or acute infarction.  IMPRESSION: No acute intracranial abnormality seen.   Electronically Signed   By: Sabino Dick M.D.   On: 09/06/2014 09:51   Ct Soft Tissue Neck W Contrast  09/06/2014   CLINICAL DATA:  Dysphagia.  Aspiration pneumonia.  Cerebral palsy.  EXAM: CT NECK WITH CONTRAST  TECHNIQUE: Multidetector CT imaging of the neck was performed using the standard protocol following the bolus administration of intravenous contrast.  CONTRAST:  18mL OMNIPAQUE IOHEXOL 300 MG/ML  SOLN  COMPARISON:  Cervical spine CT 11/24/2007  FINDINGS: The visualized portions of the brain and orbits are unremarkable. Visualized paranasal sinuses and mastoid air cells are clear.  The nasopharynx, oropharynx, oral cavity, and larynx are unremarkable. The thyroid, submandibular, and parotid glands are unremarkable.  No enlarged lymph nodes are identified in the neck. Subcentimeter left level II lymph nodes are mildly more prominent than those on the right but are likely reactive. Major vascular structures of the neck appear patent.  Moderate right pleural effusion is partially visualized. Posterior fusion hardware is partially visualized in the cervical and upper thoracic spine. The left T3 screw projects into the medial aspect of the left lung apex lateral to the vertebral body.  IMPRESSION: 1. No mass or acute abnormality identified in the neck. 2. Moderate right pleural effusion.    Electronically Signed   By: Logan Bores   On: 09/06/2014 10:52   Ct Abdomen Pelvis W Contrast  09/06/2014   CLINICAL DATA:  Intermittent rectal bleeding for 3 days. History of cerebral palsy.  EXAM: CT ABDOMEN AND PELVIS WITH CONTRAST  TECHNIQUE: Multidetector CT imaging of the abdomen and pelvis was performed using the standard protocol following bolus administration of intravenous contrast.  CONTRAST:  172mL OMNIPAQUE IOHEXOL 300 MG/ML  SOLN  COMPARISON:  11/23/2013  FINDINGS: Right lower lobe consolidation is partially visualized, as is a small right pleural effusion. 3 mm focus of calcification versus aspirated contrast in the right lower lobe is unchanged.  Evaluation of the abdomen is mildly limited by streak artifact from posterior spinal fusion hardware. Allowing for this, the liver, gallbladder, spleen, adrenal glands, right kidney, and pancreas have a grossly unremarkable appearance. 6 mm hypodensity in the lower pole of the left kidney is grossly unchanged and most likely represents a cyst.  A percutaneous gastrostomy tube is in place. Oral contrast is present in the loops of nondilated small and large bowel without evidence of obstruction. Rectal wall thickening is questioned. The appendix is identified in the right lower quadrant and is unremarkable. No free fluid or enlarged lymph nodes are identified.  Bladder is more distended than on the prior study without evidence of wall thickening. Prostate is upper limits of normal in size. Coarse calcifications in the right lower abdominal wall are unchanged. Moderate thoracolumbar dextroscoliosis is again seen with prior posterior thoracolumbar fusion noted.  IMPRESSION: 1. Small right pleural effusion and right lower lobe consolidation, incompletely visualized but suspicious for pneumonia. 2. Question rectal wall thickening. Otherwise no evidence of acute abnormality or mass in the abdomen or pelvis.   Electronically Signed   By: Logan Bores   On:  09/06/2014 10:05    Scheduled Meds: . ampicillin-sulbactam (UNASYN) IV  3 g Intravenous Q6H  . antiseptic oral rinse  7 mL Mouth Rinse q12n4p  . chlorhexidine  15 mL Mouth Rinse BID  . dantrolene  25 mg Per Tube BID AC   And  . dantrolene  50 mg Per  Tube QHS  . diazepam  2-4 mg Per Tube QHS  . divalproex  250 mg Oral Daily  . divalproex  625 mg Oral QHS  . guaiFENesin  25 mL Oral BID  . metoprolol tartrate  25 mg Per Tube BID  . OLANZapine  5 mg Per Tube QHS  . oxybutynin  5 mg Per Tube BID  . pantoprazole (PROTONIX) IV  40 mg Intravenous Q12H  . PARoxetine  15 mg Per Tube QHS  . PROBIOTIC ACIDOPHILUS   Per Tube Daily  . scopolamine  1 patch Transdermal Q72H   Continuous Infusions: . dextrose 5 % and 0.9% NaCl 75 mL/hr at 09/06/14 0200    Principal Problem:   Aspiration pneumonia Active Problems:   Infantile cerebral palsy   Rectal bleeding   Esophageal dysphagia    Adelene Idler PA-S  Imogene Burn, PA-C [name] Pager [phone]. If 7PM-7AM, please contact night-coverage at [email or web address], password [facility] 09/06/2014, 1:50 PM  LOS: 1 day       Patient seen and examined, chart and data base reviewed. Note edited.  I agree with the above assessment and plan.  For full details please see Mrs. Imogene Burn PA note.  Young male in NAD today, with coarse breath sounds but appearing to breathe comfortably.  Acute on chronic aspiration pneumonia, doing well on Unasyn, on room air today. GI consulted, appreciate input. If stable and no further GI workup should be able to go home in 1-2 days.    Marzetta Board, MD [name] [phone]

## 2014-09-06 NOTE — H&P (Signed)
[name] History and Physical  [name]:[id number] DOB: [birth year] DOA: 09/05/2014   Referring physician: patient was transferred from [facility] [place]. PCP: [name], MD   History obtained from patient's mother.  Chief Complaint: Increasing cough.  HPI: [name] is a 28 y.o. male with history of cerebral palsy was brought to the ER after patient was found to have increasing cough productive of sputum. Patient also was I mild shortness of breath. In addition patient's mother noted that patient has been examined rectal bleeding off and on lasting 3 days. When suctioning patient's mouth patient mother found the patient also had some blood on the palate. In the ER chest x-ray showed possible pneumonia and patient was febrile. Follow-up of blood was negative. Patient mother stated that patient has been only on tube feeds for last few weeks. And also had a recent swallow study done 2 days ago which showed aspiration. Patient's G-tube area shows some dark discharge. Patient was noticed to be having poor appetite over the last few weeks with loss of weight and patient's physical therapist was planned to get a CT head and neck and also was decreasing the dose of dantrolene.  Review of Systems: As presented in the history of presenting illness, rest negative.  Past Medical History  Diagnosis Date  . Cerebral palsy   . GERD (gastroesophageal reflux disease)   . Anxiety   . Depression   . Thyroid disease     hyper  . Incontinence of feces   . Palpitations   . Esophagitis   . Dehydration 11/22/2013  . Depression with anxiety 08/01/2010    Qualifier: Diagnosis of  By: Nelson-Smith CMA (AAMA), Dottie    . Loss of weight 08/28/2014   Past Surgical History  Procedure Laterality Date  . Spine surgery  ,11/20/2010, 2011  . Eye surgery    . Ears tubes    . Hamstring released    . Baclofen trial    . Baslofen pump implant    . Spinal fusion    . G-tube insert   August 2006  . Spinal fusioncorrect 106 degree kyphosis    . Spinal fusion to correct 70 degree kyphosis  11-2010  . Tonsillectomy    . Peg placement  10/21/2011    Procedure: PERCUTANEOUS ENDOSCOPIC GASTROSTOMY (PEG) REPLACEMENT;  Surgeon: [name], MD;  Location: [facility];  Service: Endoscopy;  Laterality: N/A;  . Peg placement N/A 06/13/2013    Procedure: PERCUTANEOUS ENDOSCOPIC GASTROSTOMY (PEG) REPLACEMENT;  Surgeon: [name], MD;  Location: [facility];  Service: Endoscopy;  Laterality: N/A;  . Hip surgery      x2 , side    Social History:  reports that he has never smoked. He has never used smokeless tobacco. He reports that he does not drink alcohol or use illicit drugs. Where does patient live home. Can patient participate in ADLs? No.  Allergies  Allergen Reactions  . Ambien [Zolpidem Tartrate] Nausea Only  . Codeine   . Baclofen Anxiety  . Diazepam Anxiety  . Sulfonamide Derivatives Rash    Family History:  Family History  Problem Relation Age of Onset  . Asthma Mother   . Hyperlipidemia Mother   . COPD Mother   . Other Mother     bronchial stasis/ABPA  . Cancer Maternal Grandmother 48    breast  . Hyperlipidemia Maternal Grandmother   . Hypertension Maternal Grandmother   . Cancer Maternal Grandfather     prostate  .  Heart disease Paternal Grandfather     CHF  . Osteoporosis Paternal Grandmother   . Arthritis Paternal Grandmother     rheumatoid      Prior to Admission medications   Medication Sig Start Date End Date Taking? Authorizing Provider  bacitracin 500 UNIT/GM ointment Apply 1 application topically 2 (two) times daily. 08/28/14  Yes [name], MD  cefdinir (OMNICEF) 250 MG/5ML suspension Take 5 mLs (250 mg total) by mouth 2 (two) times daily. 6cc down peg tube bid X 10 days 07/16/14  Yes [name], MD  dantrolene (DANTRIUM) 50 MG capsule Take 1 capsule (50 mg total) by mouth 2 (two) times daily. 1 tab bid and 2 tab at  qhs Patient taking differently: Take 50 mg by mouth 3 (three) times daily.  08/28/14  Yes Meredith Staggers, MD  diazepam (VALIUM) 2 MG tablet Take 1-2 tablets (2-4 mg total) by mouth at bedtime. 08/28/14  Yes Meredith Staggers, MD  Lactobacillus (PROBIOTIC ACIDOPHILUS PO) Place 30 mLs into feeding tube daily.   Yes Historical Provider, MD  LORazepam (ATIVAN) 1 MG tablet Take 1 tab po tid prn for anxiety 04/10/14  Yes [name], MD  metoprolol tartrate (LOPRESSOR) 25 MG tablet Take 25 mg by mouth 2 (two) times daily. Palpitaitons, tachycardia 07/16/14  Yes [name], MD  OLANZapine (ZYPREXA) 5 MG tablet Take 5 mg by mouth at bedtime.   Yes Historical Provider, MD  Omeprazole-Sodium Bicarbonate (ZEGERID) 20-1100 MG CAPS capsule Take 1 capsule by mouth daily before breakfast.   Yes Historical Provider, MD  oxybutynin (DITROPAN) 5 MG/5ML syrup Take 5 mg by mouth 2 (two) times daily.  06/14/14  Yes Historical Provider, MD  PARoxetine (PAXIL) 10 MG tablet Place 15 mg into feeding tube at bedtime.   Yes Historical Provider, MD  AMBULATORY NON FORMULARY MEDICATION Medication Name: MIC gastrostomy/bolus feeding tube 24 French Part number 0110-24. #2 and 10 cc lurer lock syringe #2 Dx: 07/16/14   [name], MD  clotrimazole-betamethasone (LOTRISONE) cream Apply 1 application topically 2 (two) times daily. 03/27/14   [name], MD  divalproex (DEPAKOTE SPRINKLE) 125 MG capsule Take 2 capsules in morning, 5 capsules at bedtime Patient taking differently: 250-625 mg. Take 2 capsules in morning, 5 capsules at bedtime 12/11/13   Cameron Sprang, MD  Feeding Tubes - Bags (FLEXIFLO FEEDING BAG/PUMP) MISC 1 Units by Does not apply route continuous. 05/28/14   [name], MD  Feeding Tubes - Pump MISC 1 Units by Does not apply route continuous. 05/28/14   [name], MD  Feeding Tubes - Sets [facility] EPUMP SET 1000ML) MISC 30 day supply of [place] PUmp set with flush bag 05/28/14   [name], MD  Incontinence Supplies (BARD LEG BAG STRAPS/FABRIC) MISC Bard Dispoz-a-Bag leg bag w/flip flo Valve, sterile, w/Gabric strap, 18" extension tubing 19 oz  Item #[id number] 07/30/14   [name], MD  Incontinence Supply Disposable (PREVAIL BREEZERS MEDIUM) MISC pkg of 16- size medium 32" to 44"  Breathable cloth-like outer fabric (can't use the plastic outer surgace  Item # [id number] 07/30/14   [name], MD  NON FORMULARY Bard Leg Bag Extension tubing w/Connector 18", Sterile, latex-free  Item# [id number]    Historical Provider, MD  NON FORMULARY Colorplast Freedom Cath Latex Self-Adhering Male External Catheter 1mm Diameter Intermediate  Item# [id number]    Historical Provider, MD  Nutritional Supplements (FEEDING SUPPLEMENT, JEVITY 1.5 CAL/FIBER,)  LIQD Place 1,000 mLs into feeding tube 5 (five) times daily. 08/28/14   [name], MD  Nutritional Supplements (FEEDING SUPPLEMENT, OSMOLITE 1.5 CAL,) LIQD Place 1,000 mLs into feeding tube continuous. Osmolite 1.5 X 5 cans/day. Pump rate @100  ml/hr X 12 hrs a day 09/04/14   [name], MD  nystatin cream (MYCOSTATIN) Apply 1 application topically 2 (two) times daily as needed for dry skin. 12/01/13   [name], MD  Ostomy Supplies (PROTECTIVE BARRIER WIPES) [place] 1-1/4" X 3"  Item #[id number] 07/30/14   [name], MD  PARoxetine (PAXIL) 30 MG tablet tape down per neuro phyciatrist- 15 mg 04/10/14   [name], MD  PRESCRIPTION MEDICATION G-tube    Historical Provider, MD  [name] Cath Latex Self-Adhering Male External Catheter 98mm Diameter Intermediate  Item# [id number]    Historical Provider, MD  Probiotic Product (ADVANCED PROBIOTIC 10) CAPS 2 tablets. 2 tablespoons    Historical Provider, MD  sucralfate (CARAFATE) 1 G tablet 1 tablet twice daily via PEG 01/29/14   [name], MD    Physical Exam: Filed Vitals:   09/05/14 1827 09/05/14 1855 09/05/14 2012 09/05/14 2140  BP: 106/69  114/56 123/75 125/72  Pulse:  84 91   Temp:   100.1 F (37.8 C) 99.7 F (37.6 C)  TempSrc:   Rectal Oral  Resp:  16 16 14   Weight:      SpO2:  96% 97% 96%     General:  Poorly built and nourished.  Eyes: anicteric no pallor.  ENT: no discharge from the ears eyes nose mouth.  Neck: no mass felt.  Cardiovascular: S1-S2 heard.  Respiratory: no rhonchi or crepitations.  Abdomen: soft nontender bowel sounds present.  Skin: no rash.  Musculoskeletal: no edema.  Psychiatric: patient has cerebral palsy.  Neurologic: patient has cerebral palsy.  Labs on Admission:  Basic Metabolic Panel:  Recent Labs Lab 09/05/14 1714  NA 142  K 4.4  CL 102  CO2 30  GLUCOSE 101*  BUN 11  CREATININE 0.50  CALCIUM 9.2   Liver Function Tests:  Recent Labs Lab 09/05/14 1714  AST 21  ALT 13  ALKPHOS 74  BILITOT 0.2*  PROT 7.5  ALBUMIN 3.6   No results for input(s): LIPASE, AMYLASE in the last 168 hours. No results for input(s): AMMONIA in the last 168 hours. CBC:  Recent Labs Lab 09/05/14 1714  WBC 8.0  NEUTROABS 5.4  HGB 13.3  HCT 40.4  MCV 91.4  PLT 202   Cardiac Enzymes: No results for input(s): CKTOTAL, CKMB, CKMBINDEX, TROPONINI in the last 168 hours.  BNP (last 3 results) No results for input(s): PROBNP in the last 8760 hours. CBG: No results for input(s): GLUCAP in the last 168 hours.  Radiological Exams on Admission: Dg Chest 2 View  09/05/2014   CLINICAL DATA:  Cough for 3 days ; mother states onset following barium swallow examination on Monday  EXAM: CHEST  2 VIEW  COMPARISON:  Report of the barium swallow examination of September 03, 2014  FINDINGS: The lungs are adequately inflated. No barium is demonstrated in the tracheal or bronchial passages. There is small right pleural effusion. There is right basilar pneumonia posteriorly. The heart and pulmonary vascularity are normal. There is a tubular structure which extends from the level of the right  pulmonary apex inferiorly to the thoracolumbar junction. There are posterior fusion devices and Harrington rods from the cervical through the visualized portions  of the lumbar spine.  IMPRESSION: There is right lower lobe pneumonia with small right pleural effusion. No radiodense barium is demonstrated.   Electronically Signed   By: David  Martinique   On: 09/05/2014 16:48     Assessment/Plan Principal Problem:   Aspiration pneumonia Active Problems:   Infantile cerebral palsy   Rectal bleeding   1. Aspiration pneumonia - patient has been placed on Unasyn and closely follow respiratory status. Frequent suctioning as needed. Patient had failed a recent swallow study done 2 days ago.Patient's physical therapist was planning to order CT head and neck as outpatient due to patient's progressive dysphagia which has been ordered now. 2. Rectal bleeding - patient's mother noticed some rectal bleeding and darkening around the G-tube site. At this time I have place patient nothing by mouth except medications and closely follow CBC and I have place patient on Protonix since there was some darkening around the G-tube area I have ordered CT abdomen and pelvis. Consult GI in a.m. 3. Cerebral palsy - continue home medications.    Code Status: DO NOT RESUSCITATE.  Family Communication: [name] IS AT BEDSIDE.  Disposition Plan: admit to inpatient.    Anthonie Lotito N. [name] Pager [phone].  If 7PM-7AM, please contact night-coverage [email or web address] Password [email or web address] 09/06/2014, 12:24 AM

## 2014-09-07 ENCOUNTER — Encounter (HOSPITAL_COMMUNITY)
Admission: EM | Disposition: A | Discharge status: Home / Self Care | Payer: Self-pay | Source: Home / Self Care | Attending: Internal Medicine

## 2014-09-07 ENCOUNTER — Encounter (HOSPITAL_COMMUNITY): Payer: Self-pay

## 2014-09-07 DIAGNOSIS — R1314 Dysphagia, pharyngoesophageal phase: Secondary | ICD-10-CM

## 2014-09-07 HISTORY — PX: FLEXIBLE SIGMOIDOSCOPY: SHX5431

## 2014-09-07 LAB — GLUCOSE, CAPILLARY
Glucose-Capillary: 104 mg/dL — ABNORMAL HIGH (ref 70–99)
Glucose-Capillary: 110 mg/dL — ABNORMAL HIGH (ref 70–99)
Glucose-Capillary: 139 mg/dL — ABNORMAL HIGH (ref 70–99)
Glucose-Capillary: 89 mg/dL (ref 70–99)
Glucose-Capillary: 92 mg/dL (ref 70–99)

## 2014-09-07 SURGERY — SIGMOIDOSCOPY, FLEXIBLE
Anesthesia: Moderate Sedation

## 2014-09-07 MED ORDER — OXYBUTYNIN CHLORIDE 5 MG PO TABS
5.0000 mg | ORAL_TABLET | Freq: Two times a day (BID) | ORAL | Status: DC
  Administered 2014-09-07 – 2014-09-08 (×2): 5 mg
  Filled 2014-09-07 (×3): qty 1

## 2014-09-07 MED ORDER — DIPHENHYDRAMINE HCL 50 MG/ML IJ SOLN
INTRAMUSCULAR | Status: AC
  Filled 2014-09-07: qty 1

## 2014-09-07 MED ORDER — DEXTROSE-NACL 5-0.9 % IV SOLN
INTRAVENOUS | Status: DC
  Administered 2014-09-07: 1000 mL via INTRAVENOUS
  Administered 2014-09-07 (×2): via INTRAVENOUS

## 2014-09-07 MED ORDER — FENTANYL CITRATE 0.05 MG/ML IJ SOLN
INTRAMUSCULAR | Status: AC
  Filled 2014-09-07: qty 2

## 2014-09-07 MED ORDER — MIDAZOLAM HCL 5 MG/ML IJ SOLN
INTRAMUSCULAR | Status: AC
  Filled 2014-09-07: qty 2

## 2014-09-07 NOTE — Progress Notes (Signed)
ANTIBIOTIC CONSULT NOTE  Pharmacy Consult for Unasyn Indication: aspiration pneumonia  Allergies  Allergen Reactions  . Ambien [Zolpidem Tartrate] Nausea Only  . Codeine   . Baclofen Anxiety  . Diazepam Anxiety  . Sulfonamide Derivatives Rash    Patient Measurements: Height: 5\' 2"  (157.5 cm) Weight: 91 lb (41.277 kg) IBW/kg (Calculated) : 54.6  Vital Signs: Temp: 98.6 F (37 C) (11/06 1208) Temp Source: Axillary (11/06 1208) BP: 109/58 mmHg (11/06 1338) Pulse Rate: 68 (11/06 1338) Intake/Output from previous day: 11/05 0701 - 11/06 0700 In: 1122.5 [I.V.:962.5; IV Piggyback:100] Out: 200 [Urine:200] Intake/Output from this shift: Total I/O In: 400 [Other:400] Out: 600 [Urine:600]  Labs:  Recent Labs  09/05/14 1714 09/06/14 0544  WBC 8.0 6.0  HGB 13.3 11.9*  PLT 202 165  CREATININE 0.50 0.43*   Estimated Creatinine Clearance: 78.9 mL/min (by C-G formula based on Cr of 0.43).  Assessment:  Day # 3 Unasyn for aspiration pneumonia coverage.  Also received Azithromycin 500 mg IV and Ceftriaxone 1 gram IV x 1 on 11/4.  Blood cultures negative to date. Tmax 99.3, WBC 6.0, renal function stable.  Goal of Therapy:  appropriate Unasyn dose for renal function and infection  Plan:   Continue Unasyn 3 grams IV q6hrs.  Do not expect any need to adjust regimen.  Noted plan to switch to Augmentin at discharge.  Arty Baumgartner, Doctor [facility] Pager: [phone] 09/07/2014,2:54 PM

## 2014-09-07 NOTE — Op Note (Signed)
[facility] [place], [address]   FLEXIBLE SIGMOIDOSCOPY PROCEDURE REPORT  PATIENT: Chad, [name]  MR#: [id number] BIRTHDATE: 1986-06-13 , 28  yrs. old GENDER: male ENDOSCOPIST: [name], MD PROCEDURE DATE:  09/07/2014 PROCEDURE:   Sigmoidoscopy, diagnostic ASA CLASS:   Class III INDICATIONS:rectal bleeding. question of rectal thickening at recent CT scan MEDICATIONS: None  DESCRIPTION OF PROCEDURE:   After the risks benefits and alternatives of the procedure were thoroughly explained, informed consent was obtained.  Digital rectal exam was performed and revealed no rectal mass.   The EC-[id number] ([id number])  endoscope was introduced through the anus  and advanced to the sigmoid colon , The exam was Without limitations.    The quality of the prep was The overall prep quality was adequate. .  The instrument was then slowly withdrawn as the mucosa was fully examined.        COLON FINDINGS: The colonic mucosa appeared normal in the examined portions of the sigmoid colon, the rectosigmoid colon, and rectum. No inflammation was seen. No polyps or tumors.  Retroflexed views revealed no abnormalities.    The scope was then withdrawn from the patient and the procedure terminated.  COMPLICATIONS: There were no immediate complications.  ENDOSCOPIC IMPRESSION: The colonic mucosa appeared normal in the sigmoid colon, rectosigmoid colon, and rectum  RECOMMENDATIONS: Anusol can be used for 2-3 days as needed for minor rectal bleeding felt most likely to be hemorrhoidal in nature  eSigned:  [name], MD 09/07/2014 1:33 PM   CC:The Patient

## 2014-09-07 NOTE — Progress Notes (Addendum)
PROGRESS NOTE  [name] BPZ:[id number] DOB: [birth year] DOA: 09/05/2014 PCP: [name], MD  HPI/Subjective: 28 yo male with cerebral palsy brought to the ED with increasing productive cough and shortness of breath.  Pt's mother noted that patient has had some intermittent rectal bleeding over the last 3 days. While suctioning pt's mouth pt's mother found the pt also had some blood on the palate.  On tube feeds for last few weeks. Swallow study done 2 days ago showed aspiration. PEG tube area showed some dark discharge. Pt was noticed to be having poor appetite over the last few weeks with loss of weight and patient's physical therapist was planned to get a CT head and neck and also was decreasing the dose of dantrolene.      Pt seems improved today.  Caretaker reports he had a good night.  No blood noted in stools overnight.  Cough is decreasing.       Assessment/Plan: Aspiration pneumonia: Likely due to aspiration of secretions.  Mildly elevated temp 98.2-99.3.  CXR shows RLL pneumonia with small right pleural effusion.  Continue tube feeds.  Bld cultures show NGTD.  Continue IV Unasyn. Will likely switch to Augmentin on D/C.    Mother does not want pt to have scopolamine patch- she believes it will dry him out too much.       Oropharyngeal Dysphagia: Increased dysphagia and wt loss.  Failed swallowing study on 11/02.  CT of neck shows no mass or acute abnormality identified in the neck.  CT of head shows no acute intracranial abnormality seen.  GI mentioned that recent botox injections could be affecting swallowing.  Resume tube feeds following flex sig today- evaluate how pt handles tube feeds.   Rectal bleeding: No bleeding noted today.  CT of abdomen Question rectal wall thickening. Otherwise no evidence of acute abnormality or mass in the abdomen or pelvis. H/H 11.9/36.3 decreased from 13.3/40.4 on 11/04.  GI to perform flex sig today. Continue to monitor CBC.    Cerebral palsy:  Quadriplegic.  Cared for by mother and family friends.    DVT Prophylaxis:  SCDs  Diet: NPO.  Will resume Jevity and Osmolite tube feeds after flex sig.   Code Status: DNR Family Communication: Spoke with pt's mother and care taker at bedside. Disposition Plan: Will return home when medically appropriate.    Consultants:  GI  Procedures:  None  Antibiotics:  Unasyn 3g IV q6h  Objective: Filed Vitals:   09/06/14 2242 09/06/14 2256 09/07/14 0701 09/07/14 1000  BP: 114/65  108/56 96/59  Pulse: 92  73 60  Temp:   99.3 F (37.4 C) 98 F (36.7 C)  TempSrc:   Oral Axillary  Resp:   15   Height:  5\' 2"  (1.575 m)    Weight:  41.277 kg (91 lb)    SpO2: 98%  97%     Intake/Output Summary (Last 24 hours) at 09/07/14 1146 Last data filed at 09/07/14 1052  Gross per 24 hour  Intake  347.5 ml  Output    800 ml  Net -452.5 ml   Filed Weights   09/05/14 1614 09/06/14 2256  Weight: 41.277 kg (91 lb) 41.277 kg (91 lb)   Exam: General: Quadriplegic male, NAD, looks pleasant not aggitated  HEENT:  EOMI, Anicteic Sclera, MMM. No pharyngeal erythema or exudates  Neck: Supple, no JVD, no masses  Cardiovascular: RRR, S1 S2 auscultated, no rubs, murmurs or gallops.   Respiratory: CTAB anteriorly, no  accessory muscle use  Abdomen: Soft, mildly tender, nondistended, + bowel sounds. PEG tube in place with minimal erythema, no cellulitis noted Extremities: warm dry without cyanosis clubbing or edema.  Neuro: Awake and alert.   Skin: Without rashes exudates or nodules.    Data Reviewed: Basic Metabolic Panel:  Recent Labs Lab 09/05/14 1714 09/06/14 0544  NA 142 145  K 4.4 3.9  CL 102 109  CO2 30 22  GLUCOSE 101* 89  BUN 11 6  CREATININE 0.50 0.43*  CALCIUM 9.2 8.6   Liver Function Tests:  Recent Labs Lab 09/05/14 1714 09/06/14 0544  AST 21 18  ALT 13 12  ALKPHOS 74 59  BILITOT 0.2* 0.2*  PROT 7.5 6.1  ALBUMIN 3.6 2.9*   CBC:  Recent Labs Lab 09/05/14 1714  09/06/14 0544  WBC 8.0 6.0  NEUTROABS 5.4 3.3  HGB 13.3 11.9*  HCT 40.4 36.3*  MCV 91.4 90.8  PLT 202 165   CBG:  Recent Labs Lab 09/06/14 0815 09/06/14 2029 09/07/14 0013 09/07/14 0419 09/07/14 0754  GLUCAP 101* 126* 89 104* 92    Recent Results (from the past 240 hour(s))  Blood culture (routine x 2)     Status: None (Preliminary result)   Collection Time: 09/05/14  5:30 PM  Result Value Ref Range Status   Specimen Description BLOOD RIGHT FOREARM  Final   Special Requests NONE BOTTLES DRAWN AEROBIC AND ANAEROBIC 5ML  Final   Culture  Setup Time   Final    09/05/2014 23:43 Performed at [facility]    Culture   Final           BLOOD CULTURE RECEIVED NO GROWTH TO DATE CULTURE WILL BE HELD FOR 5 DAYS BEFORE ISSUING A FINAL NEGATIVE REPORT Performed at [facility]    Report Status PENDING  Incomplete  Urine culture     Status: None   Collection Time: 09/05/14  5:48 PM  Result Value Ref Range Status   Specimen Description URINE, CLEAN CATCH  Final   Special Requests NONE  Final   Culture  Setup Time   Final    09/06/2014 00:47 Performed at [facility] Performed at [facility]   Final   Culture NO GROWTH Performed at [facility]   Final   Report Status 09/06/2014 FINAL  Final  Blood culture (routine x 2)     Status: None (Preliminary result)   Collection Time: 09/05/14  6:25 PM  Result Value Ref Range Status   Specimen Description BLOOD LEFT FA  Final   Special Requests BOTTLES DRAWN AEROBIC AND ANAEROBIC 5CC EACH  Final   Culture  Setup Time   Final    09/05/2014 23:44 Performed at [facility]    Culture   Final           BLOOD CULTURE RECEIVED NO GROWTH TO DATE CULTURE WILL BE HELD FOR 5 DAYS BEFORE ISSUING A FINAL NEGATIVE REPORT Performed at [facility]    Report Status PENDING  Incomplete     Studies: Dg Chest 2 View  09/05/2014   CLINICAL DATA:  Cough for 3  days ; mother states onset following barium swallow examination on Monday  EXAM: CHEST  2 VIEW  COMPARISON:  Report of the barium swallow examination of September 03, 2014  FINDINGS: The lungs are adequately inflated. No barium is demonstrated in the tracheal or bronchial passages. There is small  right pleural effusion. There is right basilar pneumonia posteriorly. The heart and pulmonary vascularity are normal. There is a tubular structure which extends from the level of the right pulmonary apex inferiorly to the thoracolumbar junction. There are posterior fusion devices and Harrington rods from the cervical through the visualized portions of the lumbar spine.  IMPRESSION: There is right lower lobe pneumonia with small right pleural effusion. No radiodense barium is demonstrated.   Electronically Signed   By: David  Martinique   On: 09/05/2014 16:48   Ct Head Wo Contrast  09/06/2014   CLINICAL DATA:  Dysphagia.  Cerebral palsy.  EXAM: CT HEAD WITHOUT CONTRAST  TECHNIQUE: Contiguous axial images were obtained from the base of the skull through the vertex without intravenous contrast.  COMPARISON:  CT scan of November 29, 2013.  FINDINGS: Bony calvarium appears intact. 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 No mass effect or midline shift is noted. Ventricular size is within normal limits. There is no evidence of mass lesion, hemorrhage or acute infarction.  IMPRESSION: No acute intracranial abnormality seen.   Electronically Signed   By: Sabino Dick M.D.   On: 09/06/2014 09:51   Ct Soft Tissue Neck W Contrast  09/06/2014   CLINICAL DATA:  Dysphagia.  Aspiration pneumonia.  Cerebral palsy.  EXAM: CT NECK WITH CONTRAST  TECHNIQUE: Multidetector CT imaging of the neck was performed using the standard protocol following the bolus administration of intravenous contrast.  CONTRAST:  156mL OMNIPAQUE IOHEXOL 300 MG/ML  SOLN  COMPARISON:  Cervical spine CT 11/24/2007  FINDINGS: The visualized portions of  the brain and orbits are unremarkable. Visualized paranasal sinuses and mastoid air cells are clear.  The nasopharynx, oropharynx, oral cavity, and larynx are unremarkable. The thyroid, submandibular, and parotid glands are unremarkable.  No enlarged lymph nodes are identified in the neck. Subcentimeter left level II lymph nodes are mildly more prominent than those on the right but are likely reactive. Major vascular structures of the neck appear patent.  Moderate right pleural effusion is partially visualized. Posterior fusion hardware is partially visualized in the cervical and upper thoracic spine. The left T3 screw projects into the medial aspect of the left lung apex lateral to the vertebral body.  IMPRESSION: 1. No mass or acute abnormality identified in the neck. 2. Moderate right pleural effusion.   Electronically Signed   By: Logan Bores   On: 09/06/2014 10:52   Ct Abdomen Pelvis W Contrast  09/06/2014   CLINICAL DATA:  Intermittent rectal bleeding for 3 days. History of cerebral palsy.  EXAM: CT ABDOMEN AND PELVIS WITH CONTRAST  TECHNIQUE: Multidetector CT imaging of the abdomen and pelvis was performed using the standard protocol following bolus administration of intravenous contrast.  CONTRAST:  156mL OMNIPAQUE IOHEXOL 300 MG/ML  SOLN  COMPARISON:  11/23/2013  FINDINGS: Right lower lobe consolidation is partially visualized, as is a small right pleural effusion. 3 mm focus of calcification versus aspirated contrast in the right lower lobe is unchanged.  Evaluation of the abdomen is mildly limited by streak artifact from posterior spinal fusion hardware. Allowing for this, the liver, gallbladder, spleen, adrenal glands, right kidney, and pancreas have a grossly unremarkable appearance. 6 mm hypodensity in the lower pole of the left kidney is grossly unchanged and most likely represents a cyst.  A percutaneous gastrostomy tube is in place. Oral contrast is present in the loops of nondilated small and  large bowel without evidence of obstruction. Rectal wall thickening is  questioned. The appendix is identified in the right lower quadrant and is unremarkable. No free fluid or enlarged lymph nodes are identified.  Bladder is more distended than on the prior study without evidence of wall thickening. Prostate is upper limits of normal in size. Coarse calcifications in the right lower abdominal wall are unchanged. Moderate thoracolumbar dextroscoliosis is again seen with prior posterior thoracolumbar fusion noted.  IMPRESSION: 1. Small right pleural effusion and right lower lobe consolidation, incompletely visualized but suspicious for pneumonia. 2. Question rectal wall thickening. Otherwise no evidence of acute abnormality or mass in the abdomen or pelvis.   Electronically Signed   By: Logan Bores   On: 09/06/2014 10:05    Scheduled Meds: . acidophilus  1 capsule Oral Daily  . ampicillin-sulbactam (UNASYN) IV  3 g Intravenous Q6H  . antiseptic oral rinse  7 mL Mouth Rinse q12n4p  . chlorhexidine  15 mL Mouth Rinse BID  . dantrolene  25 mg Per Tube BID AC   And  . dantrolene  50 mg Per Tube QHS  . diazepam  2-4 mg Per Tube QHS  . divalproex  250 mg Oral Daily  . divalproex  625 mg Oral QHS  . feeding supplement (OSMOLITE 1.5 CAL)  220 mL Per Tube 5 X Daily  . guaiFENesin  25 mL Oral BID  . metoprolol tartrate  25 mg Per Tube BID  . OLANZapine  5 mg Per Tube QHS  . oxybutynin  5 mg Per Tube BID  . pantoprazole (PROTONIX) IV  40 mg Intravenous Q12H  . PARoxetine  15 mg Per Tube QHS  . scopolamine  1 patch Transdermal Q72H  . sodium phosphate  1 enema Rectal BID   Continuous Infusions: . sodium chloride 20 mL/hr at 09/06/14 2350  . dextrose 5 % and 0.9% NaCl 75 mL/hr at 09/07/14 1194    Principal Problem:   Aspiration pneumonia Active Problems:   Infantile cerebral palsy   Rectal bleeding   Esophageal dysphagia    Adelene Idler PA-S  Imogene Burn, PA-C [facility] Pager [phone]. If 7PM-7AM, please contact night-coverage at [email or web address], password [facility] 09/07/2014, 11:46 AM  LOS: 2 days        Patient seen and examined, chart and data base reviewed.  I agree with the above assessment and plan.  For full details please see Mrs. Imogene Burn PA note.  He is doing really well from respiratory standpoint, on room air, tolerating antibiotics  Will have a flex sig today per GI  Anticipate that if he is stable may go home tomorrow.   Marzetta Board, MD [name] [phone]

## 2014-09-07 NOTE — Care Management Note (Signed)
    Page 1 of 1   09/07/2014     11:57:35 AM CARE MANAGEMENT NOTE 09/07/2014  Patient:  [name], [name]   Account Number:  [id number]  Date Initiated:  09/07/2014  Documentation initiated by:  Tomi Bamberger  Subjective/Objective Assessment:   dx gib  admit- from home with mom, and care taker. Has tue feeds.     Action/Plan:   11/6 flex sig today.   Anticipated DC Date:  09/08/2014   Anticipated DC Plan:  [place]  CM consult      Choice offered to / List presented to:             Status of service:  Completed, signed off Medicare Important Message given?  YES (If response is "NO", the following Medicare IM given date fields will be blank) Date Medicare IM given:  09/07/2014 Medicare IM given by:  Tomi Bamberger Date Additional Medicare IM given:   Additional Medicare IM given by:    Discharge Disposition:    Per UR Regulation:  Reviewed for med. necessity/level of care/duration of stay  If discussed at [place] of Stay Meetings, dates discussed:    Comments:  09/07/14 [place], BSN [phone] patient lives with mom, patient is on tube feeds, patient is for a flex sig today and for possible dc tomorrow.

## 2014-09-08 LAB — GLUCOSE, CAPILLARY
Glucose-Capillary: 106 mg/dL — ABNORMAL HIGH (ref 70–99)
Glucose-Capillary: 98 mg/dL (ref 70–99)

## 2014-09-08 MED ORDER — AMOXICILLIN-POT CLAVULANATE 875-125 MG PO TABS: 1.0000 | ORAL_TABLET | Freq: Two times a day (BID) | ORAL | Status: DC

## 2014-09-08 NOTE — Discharge Summary (Signed)
Physician Discharge Summary  [name] PQA:[id number] DOB: 05-Feb-1986 DOA: 09/05/2014  PCP: [name], MD  Admit date: 09/05/2014 Discharge date: 09/08/2014  Time spent: 35 minutes  Recommendations for Outpatient Follow-up:  1. Follow-up with PCP in 1-2 weeks 2. Follow-up with gastroenterology as an outpatient in 1 month   Recommendations for primary care physician for things to follow:  Monitor respiratory status  Discharge Diagnoses:  Principal Problem:   Aspiration pneumonia Active Problems:   Infantile cerebral palsy   Rectal bleeding   Esophageal dysphagia   Discharge Condition: stable  Diet recommendation: nothing by mouth, tube feeds only  Filed Weights   09/05/14 1614 09/06/14 2256  Weight: 41.277 kg (91 lb) 41.277 kg (91 lb)    History of present illness:  28 yo male with cerebral palsy brought to the ED with increasing productive cough and shortness of breath. Pt's mother noted that [name] has had some intermittent rectal bleeding over the last 3 days. While suctioning pt's mouth pt's mother found the pt also had some blood on the palate. On tube feeds for last few weeks. Swallow study done 2 days ago showed aspiration. PEG tube area showed some dark discharge. Pt was noticed to be having poor appetite over the last few weeks with loss of weight and [name]'s physical therapist was planned to get a CT head and neck and also was decreasing the dose of dantrolene.   Hospital Course:  Aspiration pneumonia in the setting of oropharyngeal dysphagia. [name] was admitted to the hospital with aspiration pneumonia. Initial chest x-ray did indeed showed right lower lobe pneumonia with small right pleural effusion. [name]'s respiratory status has been fairly good without need for supplemental oxygen and he was comfortable on room air. He was started on IV Unasyn on admission, and discharged home on by mouth Augmentin which she is to continue to complete a week  course of total antibiotics.  Rectal bleeding - this has resolved following admission. He underwent a CT of the abdomen and pelvis which showed questionable rectal wall thickening. Gastroenterology was consulted, and [name] underwent a flexible sigmoidoscopy on 09/07/2014 which was negative for acute findings. Cerebral palsy - stable His chronic medical problems were stable during this hospitalization and no additional medications changes were made to his chronic regimen.  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  Discharge Instructions     Medication List    STOP taking these medications        cefdinir 250 MG/5ML suspension  Commonly known as:  OMNICEF      TAKE these medications        ADVANCED PROBIOTIC 10 Caps  2 tablets. 2 tablespoons     AMBULATORY NON FORMULARY MEDICATION  - Medication Name: MIC gastrostomy/bolus feeding tube 24 French Part number 0110-24. #2 and  - 10 cc lurer lock syringe #2  - Dx:     amoxicillin-clavulanate  875-125 MG per tablet  Commonly known as:  AUGMENTIN  Take 1 tablet by mouth 2 (two) times daily.     bacitracin 500 UNIT/GM ointment  Apply 1 application topically 2 (two) times daily.     BARD LEG BAG STRAPS/FABRIC Misc  - Bard Dispoz-a-Bag leg bag w/flip flo Valve, sterile, w/Gabric strap, 18" extension tubing 19 oz  -   - Item #[id number]     clotrimazole-betamethasone cream  Commonly known as:  LOTRISONE  Apply 1  application topically 2 (two) times daily.     dantrolene 50 MG capsule  Commonly known as:  DANTRIUM  Take 1 capsule (50 mg total) by mouth 2 (two) times daily. 1 tab bid and 2 tab at qhs     diazepam 2 MG tablet  Commonly known as:  VALIUM  Take 1-2 tablets (2-4 mg total) by mouth at bedtime.     divalproex 125 MG capsule  Commonly known as:  DEPAKOTE SPRINKLE  Take 2 capsules in morning, 5 capsules at bedtime     feeding supplement (JEVITY 1.5 CAL/FIBER) Liqd  Place 1,000 mLs into feeding tube 5 (five) times daily.     feeding supplement (OSMOLITE 1.5 CAL) Liqd  Place 1,000 mLs into feeding tube continuous. Osmolite 1.5 X 5 cans/day. Pump rate @100  ml/hr X 12 hrs a day     Feeding Tubes - Pump Misc  1 Units by Does not apply route continuous.     FLEXIFLO FEEDING BAG/PUMP Misc  1 Units by Does not apply route continuous.     KANGAROO EPUMP SET 1000ML Misc  30 day supply of Kangaroo Joey PUmp set with flush bag     LORazepam 1 MG tablet  Commonly known as:  ATIVAN  Take 1 tab po tid prn for anxiety     metoprolol tartrate 25 MG tablet  Commonly known as:  LOPRESSOR  Take 25 mg by mouth 2 (two) times daily. Palpitaitons, tachycardia     NON FORMULARY  - Bard Leg Bag Extension tubing w/Connector 18", Sterile, latex-free  -   - Item# [id number]     NON FORMULARY  - Colorplast Freedom Cath Latex Self-Adhering Male External Catheter 84mm Diameter Intermediate  -   - Item# [id number]     nystatin cream  Commonly known as:  MYCOSTATIN  Apply 1 application topically 2 (two) times daily as needed for dry skin.     OLANZapine 5 MG tablet  Commonly known as:  ZYPREXA  Take 5 mg by mouth at bedtime.     Omeprazole-Sodium Bicarbonate 20-1100 MG Caps capsule  Commonly known as:  ZEGERID  Take 1 capsule by mouth daily before breakfast.     oxybutynin 5 MG/5ML syrup  Commonly known as:  DITROPAN  Take 5 mg by mouth 2 (two) times daily.     PARoxetine 10 MG tablet    Commonly known as:  PAXIL  Place 15 mg into feeding tube at bedtime.     PARoxetine 30 MG tablet  Commonly known as:  PAXIL  tape down per neuro phyciatrist- 15 mg     PRESCRIPTION MEDICATION  G-tube     PRESCRIPTION MEDICATION  - Colorplast Freedom Cath Latex Self-Adhering Male External Catheter 42mm Diameter Intermediate  -   - Item# [id number]     PREVAIL BREEZERS MEDIUM Misc  - pkg of 16- size medium 32" to 44"  -   - Breathable cloth-like outer fabric (can't use the plastic outer surgace  -   -  Item # [id number]     PROBIOTIC ACIDOPHILUS PO  Place 30 mLs into feeding tube daily.     Protective Barrier Wipes Misc  - 1-1/4" X 3"  -   - Item #[id number]     sucralfate 1 G tablet  Commonly known as:  CARAFATE  1 tablet twice daily via PEG           Follow-up Information    Follow up with [name], MD. Schedule an appointment as soon as possible for a visit in 1 week.   Specialty:  Family Medicine   Contact information:   [place] [address] [phone]       The results of significant diagnostics from this hospitalization (including imaging, microbiology, ancillary and laboratory) are listed below for reference.    Significant Diagnostic Studies: Dg Chest 2 View  09/05/2014   CLINICAL DATA:  Cough for 3 days ; mother states onset following barium swallow examination on Monday  EXAM: CHEST  2 VIEW  COMPARISON:  Report of the barium swallow examination of September 03, 2014  FINDINGS: The lungs are adequately inflated. No barium is demonstrated in the tracheal or bronchial passages. There is small right pleural effusion. There is right basilar pneumonia posteriorly. The heart and pulmonary vascularity are normal. There is a tubular structure which extends from the level of the right pulmonary apex inferiorly to the thoracolumbar junction. There are posterior fusion devices and Harrington rods from the cervical through the  visualized portions of the lumbar spine.  IMPRESSION: There is right lower lobe pneumonia with small right pleural effusion. No radiodense barium is demonstrated.   Electronically Signed   By: David  Martinique   On: 09/05/2014 16:48   Ct Head Wo Contrast  09/06/2014   CLINICAL DATA:  Dysphagia.  Cerebral palsy.  EXAM: CT HEAD WITHOUT CONTRAST  TECHNIQUE: Contiguous axial images were obtained from the base of the skull through the vertex without intravenous contrast.  COMPARISON:  CT scan of November 29, 2013.  FINDINGS: Bony calvarium appears intact. 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 No mass effect or midline shift is noted. Ventricular size is within normal limits. There is no evidence of mass lesion, hemorrhage or acute infarction.  IMPRESSION: No acute intracranial abnormality seen.   Electronically Signed   By: Sabino Dick M.D.   On: 09/06/2014 09:51   Ct Soft Tissue Neck W Contrast  09/06/2014   CLINICAL DATA:  Dysphagia.  Aspiration pneumonia.  Cerebral palsy.  EXAM: CT NECK WITH CONTRAST  TECHNIQUE: Multidetector CT imaging of the neck was performed using the standard protocol following the bolus administration of intravenous contrast.  CONTRAST:  172mL OMNIPAQUE IOHEXOL 300 MG/ML  SOLN  COMPARISON:  Cervical spine CT 11/24/2007  FINDINGS: The visualized portions of the brain and orbits are unremarkable. Visualized paranasal sinuses and mastoid air cells are clear.  The nasopharynx, oropharynx, oral cavity, and larynx are unremarkable. The thyroid, submandibular, and parotid glands are unremarkable.  No enlarged lymph nodes are identified in the neck. Subcentimeter left level II lymph nodes are mildly more prominent than those on the right but are likely reactive. Major vascular structures of the neck appear patent.  Moderate right pleural effusion is partially visualized. Posterior fusion hardware is partially visualized in the cervical and upper thoracic spine. The left T3 screw  projects into the medial aspect of the left lung apex lateral to the vertebral body.  IMPRESSION: 1.  No mass or acute abnormality identified in the neck. 2. Moderate right pleural effusion.   Electronically Signed   By: Logan Bores   On: 09/06/2014 10:52   Ct Abdomen Pelvis W Contrast  09/06/2014   CLINICAL DATA:  Intermittent rectal bleeding for 3 days. History of cerebral palsy.  EXAM: CT ABDOMEN AND PELVIS WITH CONTRAST  TECHNIQUE: Multidetector CT imaging of the abdomen and pelvis was performed using the standard protocol following bolus administration of intravenous contrast.  CONTRAST:  155mL OMNIPAQUE IOHEXOL 300 MG/ML  SOLN  COMPARISON:  11/23/2013  FINDINGS: Right lower lobe consolidation is partially visualized, as is a small right pleural effusion. 3 mm focus of calcification versus aspirated contrast in the right lower lobe is unchanged.  Evaluation of the abdomen is mildly limited by streak artifact from posterior spinal fusion hardware. Allowing for this, the liver, gallbladder, spleen, adrenal glands, right kidney, and pancreas have a grossly unremarkable appearance. 6 mm hypodensity in the lower pole of the left kidney is grossly unchanged and most likely represents a cyst.  A percutaneous gastrostomy tube is in place. Oral contrast is present in the loops of nondilated small and large bowel without evidence of obstruction. Rectal wall thickening is questioned. The appendix is identified in the right lower quadrant and is unremarkable. No free fluid or enlarged lymph nodes are identified.  Bladder is more distended than on the prior study without evidence of wall thickening. Prostate is upper limits of normal in size. Coarse calcifications in the right lower abdominal wall are unchanged. Moderate thoracolumbar dextroscoliosis is again seen with prior posterior thoracolumbar fusion noted.  IMPRESSION: 1. Small right pleural effusion and right lower lobe consolidation, incompletely visualized but  suspicious for pneumonia. 2. Question rectal wall thickening. Otherwise no evidence of acute abnormality or mass in the abdomen or pelvis.   Electronically Signed   By: Logan Bores   On: 09/06/2014 10:05   Dg Op Swallowing Func-medicare/speech Path  09/03/2014   CLINICAL DATA:  28 year old male with cerebral palsy and dysphagia. Initial encounter.  EXAM: MODIFIED BARIUM SWALLOW  TECHNIQUE: Different consistencies of barium were administered orally to the [name] by the Speech Pathologist. Imaging of the pharynx was performed in the lateral projection.  FLUOROSCOPY TIME:  2 min 37 seconds  COMPARISON:  Chest radiographs 07/24/2014 and earlier.  FINDINGS: Thin liquid- not attempted  Nectar thick liquid- aspiration (with the larger of two spoonfuls) which did not elicit spontaneous coughing. Also directed cough then did not significantly clear the residual.  Honey- not attempted  Pure (pudding consistency)- penetration, and adherence of barium along the dorsal surface of the epiglottis which was poorly cleared with additional swallows and coughing. Residual, poorly cleared.  Cracker-not attempted  Pure with cracker- not attempted  Barium tablet -  not attempted  IMPRESSION: Penetration and silent aspiration demonstrated.  Please refer to the Speech Pathologists report for complete details and recommendations.   Electronically Signed   By: Lars Pinks M.D.   On: 09/03/2014 14:35    Microbiology: Recent Results (from the past 240 hour(s))  Blood culture (routine x 2)     Status: None (Preliminary result)   Collection Time: 09/05/14  5:30 PM  Result Value Ref Range Status   Specimen Description BLOOD RIGHT FOREARM  Final   Special Requests NONE BOTTLES DRAWN AEROBIC AND ANAEROBIC 5ML  Final   Culture  Setup Time   Final    09/05/2014 23:43 Performed at [facility]  Culture   Final           BLOOD CULTURE RECEIVED NO GROWTH TO DATE CULTURE WILL BE HELD FOR 5 DAYS BEFORE ISSUING A FINAL  NEGATIVE REPORT Performed at [facility]    Report Status PENDING  Incomplete  Urine culture     Status: None   Collection Time: 09/05/14  5:48 PM  Result Value Ref Range Status   Specimen Description URINE, CLEAN CATCH  Final   Special Requests NONE  Final   Culture  Setup Time   Final    09/06/2014 00:47 Performed at [place] Performed at [facility]   Final   Culture NO GROWTH Performed at [facility]   Final   Report Status 09/06/2014 FINAL  Final  Blood culture (routine x 2)     Status: None (Preliminary result)   Collection Time: 09/05/14  6:25 PM  Result Value Ref Range Status   Specimen Description BLOOD LEFT FA  Final   Special Requests BOTTLES DRAWN AEROBIC AND ANAEROBIC 5CC EACH  Final   Culture  Setup Time   Final    09/05/2014 23:44 Performed at [facility]    Culture   Final           BLOOD CULTURE RECEIVED NO GROWTH TO DATE CULTURE WILL BE HELD FOR 5 DAYS BEFORE ISSUING A FINAL NEGATIVE REPORT Performed at [facility]    Report Status PENDING  Incomplete     Labs: Basic Metabolic Panel:  Recent Labs Lab 09/05/14 1714 09/06/14 0544  NA 142 145  K 4.4 3.9  CL 102 109  CO2 30 22  GLUCOSE 101* 89  BUN 11 6  CREATININE 0.50 0.43*  CALCIUM 9.2 8.6   Liver Function Tests:  Recent Labs Lab 09/05/14 1714 09/06/14 0544  AST 21 18  ALT 13 12  ALKPHOS 74 59  BILITOT 0.2* 0.2*  PROT 7.5 6.1  ALBUMIN 3.6 2.9*   CBC:  Recent Labs Lab 09/05/14 1714 09/06/14 0544  WBC 8.0 6.0  NEUTROABS 5.4 3.3  HGB 13.3 11.9*  HCT 40.4 36.3*  MCV 91.4 90.8  PLT 202 165   CBG:  Recent Labs Lab 09/07/14 0754 09/07/14 1659 09/07/14 2205 09/07/14 2358 09/08/14 0423  GLUCAP 92 139* 110* 98 106*    Signed:  Harlie Buening  [name] 09/08/2014, 3:51 PM

## 2014-09-08 NOTE — Progress Notes (Signed)
[name] to be D/C'd Home per MD order.  Discussed with the patient and all questions fully answered.    Medication List    STOP taking these medications        cefdinir 250 MG/5ML suspension  Commonly known as:  OMNICEF      TAKE these medications        ADVANCED PROBIOTIC 10 Caps  2 tablets. 2 tablespoons     AMBULATORY NON FORMULARY MEDICATION  - Medication Name: MIC gastrostomy/bolus feeding tube 24 French Part number 0110-24. #2 and  - 10 cc lurer lock syringe #2  - Dx:     amoxicillin-clavulanate 875-125 MG per tablet  Commonly known as:  AUGMENTIN  Take 1 tablet by mouth 2 (two) times daily.     bacitracin 500 UNIT/GM ointment  Apply 1 application topically 2 (two) times daily.     BARD LEG BAG STRAPS/FABRIC Misc  - Bard Dispoz-a-Bag leg bag w/flip flo Valve, sterile, w/Gabric strap, 18" extension tubing 19 oz  -   - Item #[id number]     clotrimazole-betamethasone cream  Commonly known as:  LOTRISONE  Apply 1 application topically 2 (two) times daily.     dantrolene 50 MG capsule  Commonly known as:  DANTRIUM  Take 1 capsule (50 mg total) by mouth 2 (two) times daily. 1 tab bid and 2 tab at qhs     diazepam 2 MG tablet  Commonly known as:  VALIUM  Take 1-2 tablets (2-4 mg total) by mouth at bedtime.     divalproex 125 MG capsule  Commonly known as:  DEPAKOTE SPRINKLE  Take 2 capsules in morning, 5 capsules at bedtime     feeding supplement (JEVITY 1.5 CAL/FIBER) Liqd  Place 1,000 mLs into feeding tube 5 (five) times daily.     feeding supplement (OSMOLITE 1.5 CAL) Liqd  Place 1,000 mLs into feeding tube continuous. Osmolite 1.5 X 5 cans/day. Pump rate @100  ml/hr X 12 hrs a day     Feeding Tubes - Pump Misc  1 Units by Does not apply route continuous.     FLEXIFLO FEEDING BAG/PUMP Misc  1 Units by Does not apply route continuous.     KANGAROO EPUMP SET 1000ML Misc  30 day supply of Kangaroo Joey PUmp set with flush bag     LORazepam 1 MG  tablet  Commonly known as:  ATIVAN  Take 1 tab po tid prn for anxiety     metoprolol tartrate 25 MG tablet  Commonly known as:  LOPRESSOR  Take 25 mg by mouth 2 (two) times daily. Palpitaitons, tachycardia     NON FORMULARY  - Bard Leg Bag Extension tubing w/Connector 18", Sterile, latex-free  -   - Item# [id number]     NON FORMULARY  - Colorplast Freedom Cath Latex Self-Adhering Male External Catheter 77mm Diameter Intermediate  -   - Item# [id number]     nystatin cream  Commonly known as:  MYCOSTATIN  Apply 1 application topically 2 (two) times daily as needed for dry skin.     OLANZapine 5 MG tablet  Commonly known as:  ZYPREXA  Take 5 mg by mouth at bedtime.     Omeprazole-Sodium Bicarbonate 20-1100 MG Caps capsule  Commonly known as:  ZEGERID  Take 1 capsule by mouth daily before breakfast.     oxybutynin 5 MG/5ML syrup  Commonly known as:  DITROPAN  Take 5 mg by mouth 2 (two) times daily.     PARoxetine  10 MG tablet  Commonly known as:  PAXIL  Place 15 mg into feeding tube at bedtime.     PARoxetine 30 MG tablet  Commonly known as:  PAXIL  tape down per neuro phyciatrist- 15 mg     PRESCRIPTION MEDICATION  G-tube     PRESCRIPTION MEDICATION  - Colorplast Freedom Cath Latex Self-Adhering Male External Catheter 31mm Diameter Intermediate  -   - Item# [id number]     PREVAIL BREEZERS MEDIUM Misc  - pkg of 16- size medium 32" to 44"  -   - Breathable cloth-like outer fabric (can't use the plastic outer surgace  -   - Item # [id number]     PROBIOTIC ACIDOPHILUS PO  Place 30 mLs into feeding tube daily.     Protective Barrier Wipes Misc  - 1-1/4" X 3"  -   - Item #[id number]     sucralfate 1 G tablet  Commonly known as:  CARAFATE  1 tablet twice daily via PEG        VVS, Skin clean, dry and intact without evidence of skin break down, no evidence of skin tears noted. IV catheter discontinued intact. Site without signs and symptoms of complications.  Dressing and pressure applied.  An After Visit Summary was printed and given to the patient.  D/c education completed with patient/family including follow up instructions, medication list, d/c activities limitations if indicated, with other d/c instructions as indicated by MD - patient able to verbalize understanding, all questions fully answered.   Patient instructed to return to ED, call 911, or call MD for any changes in condition.   Patient escorted via [place], and D/C home via private auto.  [name] D 09/08/2014 11:21 AM

## 2014-09-08 NOTE — Discharge Instructions (Signed)

## 2014-09-10 ENCOUNTER — Telehealth: Payer: Self-pay | Admitting: Family Medicine

## 2014-09-10 ENCOUNTER — Telehealth: Payer: Self-pay | Admitting: Pulmonary Disease

## 2014-09-10 ENCOUNTER — Encounter (HOSPITAL_COMMUNITY): Payer: Self-pay | Admitting: Internal Medicine

## 2014-09-10 MED ORDER — LEVOFLOXACIN 500 MG PO TABS: 500.0000 mg | ORAL_TABLET | Freq: Every day | ORAL | Status: DC

## 2014-09-10 NOTE — Telephone Encounter (Signed)
Pt mother returning call.[name] ° °

## 2014-09-10 NOTE — Telephone Encounter (Signed)
Called spoke with patient's mother and discussed RA's recommendations with her Levaquin 500mg  #5 sent to [facility] follow up scheduled with TP for 11.20.15 Nothing further needed at this time; will sign off

## 2014-09-10 NOTE — Telephone Encounter (Signed)
lmtcb X1 for mother. Will send in abx after verifying pharmacy.

## 2014-09-10 NOTE — Telephone Encounter (Signed)
Spoke with Mother [name] Pt seen in hospital with aspiration PNA 09/05/14 Pt D/C on Augmentin 875 (1 pill BID) 09/08/14 Pt having increased diarrhea, unable to keep any of his feedings in. Mother states that she stopped his Augmentin 875mg  09/08/14  d/t these increased GI issues.  Mother was concerned with the fact that he was able to keep anything in his system Still coughing up yellow thick mucus. Mother denies hearing any wheezing or chest rattling.  Please advise Dr Elsworth Soho. Thanks.

## 2014-09-10 NOTE — Telephone Encounter (Signed)
Reviewed CXR & other ER scans Augmentin can cause diarrhea SUggest -use levaquin 500 x 5 days instead

## 2014-09-10 NOTE — Telephone Encounter (Signed)
I am also confused please check with Mom and clarify what services she wants.

## 2014-09-10 NOTE — Telephone Encounter (Signed)
Please advise? I thought mom wasn't wanting speech therapy?

## 2014-09-10 NOTE — Telephone Encounter (Signed)
Caller name: Leafy Ro  Relation to pt: self  Call back number: [phone]   Reason for call:   Requesting orders to resume skill nursing and speech therapist pt was discharged from hospital

## 2014-09-11 ENCOUNTER — Encounter: Payer: Self-pay | Admitting: *Deleted

## 2014-09-11 LAB — CULTURE, BLOOD (ROUTINE X 2)
Culture: NO GROWTH
Culture: NO GROWTH

## 2014-09-13 ENCOUNTER — Telehealth: Payer: Self-pay

## 2014-09-13 NOTE — Telephone Encounter (Signed)
Admit date: 09/05/2014 Discharge date: 09/08/2014  Left a message for call back.

## 2014-09-14 ENCOUNTER — Encounter: Payer: Self-pay | Admitting: Family Medicine

## 2014-09-17 ENCOUNTER — Other Ambulatory Visit: Payer: Self-pay | Admitting: Family Medicine

## 2014-09-17 DIAGNOSIS — G809 Cerebral palsy, unspecified: Secondary | ICD-10-CM

## 2014-09-17 NOTE — Telephone Encounter (Signed)
Spoke with [name], patient's mother, who states that Dr. Charlett Blake was fine with her following up with her pulmonologist since patient went into the hospital for aspiration pneumonia.  Pt has an appointment scheduled with him on Friday, 09/21/14.  Pt states that what she needs from Dr. Charlett Blake is an order to re-establish nursing care services for her son through [place].  She said that speech therapy is not need.  She also mentioned that patient has been having diarrhea x 9 days.  Mother shared that she stopped given son Levaquin due to diarrhea (pt only received two doses of the antibiotic before it was stopped on 09/13/14), but the diarrhea has not stopped.  Diarrhea was present even while patient was in the hospital.  She is self-treating with Imodium and pedialyte with no relief.  She said that patient's "bottom and private area is all red."     Please advise.

## 2014-09-17 NOTE — Telephone Encounter (Signed)
When patient calls back, please schedule a hospital follow up appointment.

## 2014-09-17 NOTE — Telephone Encounter (Signed)
Left a message for call back.  

## 2014-09-18 ENCOUNTER — Encounter (HOSPITAL_BASED_OUTPATIENT_CLINIC_OR_DEPARTMENT_OTHER): Payer: Self-pay

## 2014-09-18 ENCOUNTER — Telehealth: Payer: Self-pay | Admitting: Family Medicine

## 2014-09-18 ENCOUNTER — Emergency Department (HOSPITAL_BASED_OUTPATIENT_CLINIC_OR_DEPARTMENT_OTHER): Payer: Medicare Other

## 2014-09-18 ENCOUNTER — Emergency Department (HOSPITAL_BASED_OUTPATIENT_CLINIC_OR_DEPARTMENT_OTHER)
Admission: EM | Admit: 2014-09-18 | Discharge: 2014-09-18 | Disposition: A | Discharge status: Home / Self Care | Payer: Medicare Other | Attending: Emergency Medicine | Admitting: Emergency Medicine

## 2014-09-18 DIAGNOSIS — Z79899 Other long term (current) drug therapy: Secondary | ICD-10-CM | POA: Diagnosis not present

## 2014-09-18 DIAGNOSIS — Z8639 Personal history of other endocrine, nutritional and metabolic disease: Secondary | ICD-10-CM | POA: Insufficient documentation

## 2014-09-18 DIAGNOSIS — R197 Diarrhea, unspecified: Secondary | ICD-10-CM | POA: Insufficient documentation

## 2014-09-18 DIAGNOSIS — J690 Pneumonitis due to inhalation of food and vomit: Secondary | ICD-10-CM | POA: Insufficient documentation

## 2014-09-18 DIAGNOSIS — R059 Cough, unspecified: Secondary | ICD-10-CM

## 2014-09-18 DIAGNOSIS — Z792 Long term (current) use of antibiotics: Secondary | ICD-10-CM | POA: Insufficient documentation

## 2014-09-18 DIAGNOSIS — R05 Cough: Secondary | ICD-10-CM

## 2014-09-18 DIAGNOSIS — F419 Anxiety disorder, unspecified: Secondary | ICD-10-CM | POA: Insufficient documentation

## 2014-09-18 DIAGNOSIS — K219 Gastro-esophageal reflux disease without esophagitis: Secondary | ICD-10-CM | POA: Diagnosis not present

## 2014-09-18 DIAGNOSIS — G8250 Quadriplegia, unspecified: Secondary | ICD-10-CM | POA: Diagnosis not present

## 2014-09-18 DIAGNOSIS — G809 Cerebral palsy, unspecified: Secondary | ICD-10-CM | POA: Insufficient documentation

## 2014-09-18 DIAGNOSIS — F329 Major depressive disorder, single episode, unspecified: Secondary | ICD-10-CM | POA: Diagnosis not present

## 2014-09-18 LAB — COMPREHENSIVE METABOLIC PANEL
ALT: 15 U/L (ref 0–53)
AST: 20 U/L (ref 0–37)
Albumin: 3.5 g/dL (ref 3.5–5.2)
Alkaline Phosphatase: 85 U/L (ref 39–117)
Anion gap: 11 (ref 5–15)
BUN: 6 mg/dL (ref 6–23)
CO2: 30 mEq/L (ref 19–32)
Calcium: 9.7 mg/dL (ref 8.4–10.5)
Chloride: 104 mEq/L (ref 96–112)
Creatinine, Ser: 0.4 mg/dL — ABNORMAL LOW (ref 0.50–1.35)
GFR calc Af Amer: >90 mL/min (ref >90)
GFR calc non Af Amer: >90 mL/min (ref >90)
Glucose, Bld: 90 mg/dL (ref 70–99)
Potassium: 4.2 mEq/L (ref 3.7–5.3)
Sodium: 145 mEq/L (ref 137–147)
Total Bilirubin: <0.2 mg/dL — ABNORMAL LOW (ref 0.3–1.2)
Total Protein: 7.8 g/dL (ref 6.0–8.3)

## 2014-09-18 LAB — CBC WITH DIFFERENTIAL/PLATELET
Basophils Absolute: 0 10*3/uL (ref 0.0–0.1)
Basophils Relative: 0 % (ref 0–1)
Eosinophils Absolute: 0.2 10*3/uL (ref 0.0–0.7)
Eosinophils Relative: 3 % (ref 0–5)
HCT: 43.1 % (ref 39.0–52.0)
Hemoglobin: 14.1 g/dL (ref 13.0–17.0)
Lymphocytes Relative: 28 % (ref 12–46)
Lymphs Abs: 1.7 10*3/uL (ref 0.7–4.0)
MCH: 29.7 pg (ref 26.0–34.0)
MCHC: 32.7 g/dL (ref 30.0–36.0)
MCV: 90.7 fL (ref 78.0–100.0)
Monocytes Absolute: 0.4 10*3/uL (ref 0.1–1.0)
Monocytes Relative: 6 % (ref 3–12)
Neutro Abs: 3.7 10*3/uL (ref 1.7–7.7)
Neutrophils Relative %: 63 % (ref 43–77)
Platelets: 255 10*3/uL (ref 150–400)
RBC: 4.75 MIL/uL (ref 4.22–5.81)
RDW: 12.7 % (ref 11.5–15.5)
WBC: 5.9 10*3/uL (ref 4.0–10.5)

## 2014-09-18 MED ORDER — LEVOFLOXACIN 25 MG/ML PO SOLN: 750.0000 mg | Freq: Every day | ORAL | Status: DC

## 2014-09-18 MED ORDER — SODIUM CHLORIDE 0.9 % IV BOLUS (SEPSIS)
1000.0000 mL | Freq: Once | INTRAVENOUS | Status: AC
  Administered 2014-09-18: 1000 mL via INTRAVENOUS

## 2014-09-18 NOTE — ED Notes (Signed)
Mother states pt was released from hospital ten days ago for pneumonia.  Pt has been having numerous loose stools and seemed to be losing too much fluids intake.  Mother states his BP has been going lower 32-76 systolic and she has given fluid bolus in GT tube.  Pt also has dry cracked lips, and mucous membranes seem pasty.  BP improved today but is concerned about fluids lost due to diarrhea.  Pt also continues to have a productive cough.  She has stopped augmentin temporarily due to dehydration.

## 2014-09-18 NOTE — Telephone Encounter (Signed)
Caller name: Meghan  Relation to pt: other  Call back number: [phone]   Reason for call:  RN Advised  Timonthy mother to take pt to [facility] due to pt BP being 79/40

## 2014-09-18 NOTE — Discharge Instructions (Signed)
Aspiration Pneumonia  Aspiration pneumonia is an infection in your lungs. It occurs when food, liquid, or stomach contents (vomit) are inhaled (aspirated) into your lungs. When these things get into your lungs, swelling (inflammation) and infection can occur. This can make it difficult for you to breathe. Aspiration pneumonia is a serious condition and can be life threatening. RISK FACTORS Aspiration pneumonia is more likely to occur when a person's cough (gag) reflex or ability to swallow has been decreased. Some things that can do this include:   Having a brain injury or disease, such as stroke, seizures, Parkinson's disease, dementia, or amyotrophic lateral sclerosis (ALS).   Being given general anesthetic for procedures.   Being in a coma (unconscious).   Having a narrowing of the tube that carries food to the stomach (esophagus).   Drinking too much alcohol. If a person passes out and vomits, vomit can be swallowed into the lungs.   Taking certain medicines, such as tranquilizers or sedatives.  SIGNS AND SYMPTOMS   Coughing after swallowing food or liquids.   Breathing problems, such as wheezing or shortness of breath.   Bluish skin. This can be caused by lack of oxygen.   Coughing up food or mucus. The mucus might contain blood, greenish material, or yellowish-white fluid (pus).   Fever.   Chest pain.   Being more tired than usual (fatigue).   Sweating more than usual.   Bad breath.  DIAGNOSIS  A physical exam will be done. During the exam, the health care provider will listen to your lungs with a stethoscope to check for:   Crackling sounds in the lungs.  Decreased breath sounds.  A rapid heartbeat. Various tests may be ordered. These may include:   Chest X-ray.   CT scan.   Swallowing study. This test looks at how food is swallowed and whether it goes into your breathing tube (trachea) or food pipe (esophagus).   Sputum culture. Saliva and  mucus (sputum) are collected from the lungs or the tubes that carry air to the lungs (bronchi). The sputum is then tested for bacteria.   Bronchoscopy. This test uses a flexible tube (bronchoscope) to see inside the lungs. TREATMENT  Treatment will usually include antibiotic medicines. Other medicines may also be used to reduce fever or pain. You may need to be treated in the hospital. In the hospital, your breathing will be carefully monitored. Depending on how well you are breathing, you may need to be given oxygen, or you may need breathing support from a breathing machine (ventilator). For people who fail a swallowing study, a feeding tube might be placed in the stomach, or they may be asked to avoid certain food textures or liquids when they eat. HOME CARE INSTRUCTIONS   Carefully follow any special eating instructions you were given, such as avoiding certain food textures or thickening liquids. This reduces the risk of developing aspiration pneumonia again.  Only take over-the-counter or prescription medicines as directed by your health care provider. Follow the directions carefully.   If you were prescribed antibiotics, take them as directed. Finish them even if you start to feel better.   Rest as instructed by your health care provider.   Keep all follow-up appointments with your health care provider.  SEEK MEDICAL CARE IF:   You develop worsening shortness of breath, wheezing, or difficulty breathing.   You develop a fever.   You have chest pain.  MAKE SURE YOU:   Understand these instructions.  Will watch   condition.  Will get help right away if you are not doing well or get worse. Document Released: 08/16/2009 Document Revised: 10/24/2013 Document Reviewed: 04/06/2013 [facility] Patient Information 2015 [place], [place]. This information is not intended to replace advice given to you by your health care provider. Make sure you discuss any questions you have with  your health care provider.  Clostridium Difficile Toxin This is a test which may be done when a patient has diarrhea that lasts for several days, or has abdominal pain, fever, and nausea after antibiotic therapy.  This test looks for the presence of Clostridium difficile (C.diff.) toxin in a stool sample. C.diff. is a germ (bacterium) that is one of the groups of bacteria that are usually in the colon, called "normal flora." If something upsets the growth of the other normal flora, C.diff. may overgrow and disrupt the balance of bacteria in the colon. C. diff. may produce two toxins, A and B. The combination of overgrowth and toxins may cause prolonged diarrhea. The toxins may damage the lining of the colon and lead to colitis.  While some cases of C. diff. diarrhea and colitis do not require treatment, others require specific oral antibiotic therapy. Most patients improve as the normal flora re-establishes itself, but about some may have one or more relapses, with symptoms and detectible toxin levels coming back. PREPARATION FOR TEST There is no special preparation for the test. A fresh stool sample is collected in a sterile container. The sample should not be mixed with urine or water. The stool should be taken to the lab within an hour. It may be refrigerated or frozen and taken to the lab as soon as possible. The container should be labeled with your name and the date and time of the stool collection.  NORMAL FINDINGS Negative Tissue Culture (no toxin identified) Ranges for normal findings may vary among different laboratories and hospitals. You should always check with your doctor after having lab work or other tests done to discuss the meaning of your test results and whether your values are considered within normal limits. MEANING OF TEST  Your caregiver will go over the test results with you and discuss the importance and meaning of your results, as well as treatment options and the need for  additional tests if necessary. OBTAINING THE TEST RESULTS It is your responsibility to obtain your test results. Ask the lab or department performing the test when and how you will get your results. Document Released: 11/11/2004 Document Revised: 01/11/2012 Document Reviewed: 09/27/2008 [facility] Patient Information 2015 [place], [place]. This information is not intended to replace advice given to you by your health care provider. Make sure you discuss any questions you have with your health care provider.   Continue levofloxacin as we discussed. Checking for C. Difficile would be important. Return for any new or worse symptoms.

## 2014-09-18 NOTE — Telephone Encounter (Signed)
Spoke with [name]'s mother who stated that they were on their way to Urgent Care.  Mother stated that the 58/40 blood pressure was yesterday.  During the night his blood pressure was in the 60s/30s.  She shared that his blood pressure has improved this morning at 127/85, HR 80 after giving him 2500 ml of Pedialyte and water (mother states 500 mls of the Pedialyte was given as a bolus over 1 hour through peg.  The remainder of the fluids--Pedialyte and water--was given at a rate 200 ml/hr).  She says that his personality has returned and his smile is back now.   She says that he is afebrile.  Mother was still encouraged to take patient to UC for evaluation.

## 2014-09-18 NOTE — ED Provider Notes (Signed)
CSN: [id number]     Arrival date & time 09/18/14  1042 History   First MD Initiated Contact with Patient 09/18/14 1155     Chief Complaint  Patient presents with  . Diarrhea     (Consider location/radiation/quality/duration/timing/severity/associated sxs/prior Treatment) Patient is a 28 y.o. male presenting with diarrhea. The history is provided by the patient and a parent. The history is limited by the condition of the patient.  Diarrhea history provided by the parent. Patient has cerebral palsy and quadriplegia.patient was admitted for an aspiration pneumonia in November 4-7. Discharged home on oral antibiotics Augmentin initially and recently changed by primary care doctor to [place]. However the pills been difficult to get down the G-tube so he has not had any doses slightly. Patient has been having difficulty with diarrhea since arrival home. Worse the first few days home now improving however not resolved. No blood in the bowel movements. Not having greater than 10 bowel movements a day. Patient appeared to the parent to be dehydrated overnight she gave him fluid boluses. Felt that if seemed to be better today. Also had some bouts of some low blood pressure yesterday. Here patient looks good vital signs look good.  Past Medical History  Diagnosis Date  . Cerebral palsy   . GERD (gastroesophageal reflux disease)   . Hyperthyroidism   . Incontinence of feces   . Palpitations   . Esophagitis 2011  . Dehydration 11/22/2013  . Depression with anxiety 08/01/2010    Qualifier: Diagnosis of  By: Nelson-Smith CMA (AAMA), Dottie    . Loss of weight 08/28/2014   Past Surgical History  Procedure Laterality Date  . Spine surgery  ,11/20/2010, 2011    for correction of severe contracturing spinal kyphosis.   . Eye surgery    . Ears tubes    . Hamstring released      to treat contractures.   . Baclofen trial    . Baslofen pump implant    . Spinal fusion    . G-tube insert  August 2006  .  Spinal fusioncorrect 106 degree kyphosis    . Spinal fusion to correct 70 degree kyphosis  11-2010  . Tonsillectomy    . Peg placement  10/21/2011    Procedure: PERCUTANEOUS ENDOSCOPIC GASTROSTOMY (PEG) REPLACEMENT;  Surgeon: [name], MD;  Location: [facility] ENDOSCOPY;  Service: Endoscopy;  Laterality: N/A;  . Peg placement N/A 06/13/2013    Procedure: PERCUTANEOUS ENDOSCOPIC GASTROSTOMY (PEG) REPLACEMENT;  Surgeon: [name], MD;  Location: [facility] ENDOSCOPY;  Service: Endoscopy;  Laterality: N/A;  . Hip surgery      x2 , side   . Flexible sigmoidoscopy N/A 09/07/2014    Procedure: FLEXIBLE SIGMOIDOSCOPY;  Surgeon: [name], MD;  Location: [facility] ENDOSCOPY;  Service: Endoscopy;  Laterality: N/A;   Family History  Problem Relation Age of Onset  . Asthma Mother   . Hyperlipidemia Mother   . COPD Mother   . Other Mother     bronchial stasis/ABPA  . Cancer Maternal Grandmother 63    breast  . Hyperlipidemia Maternal Grandmother   . Hypertension Maternal Grandmother   . Cancer Maternal Grandfather     prostate  . Heart disease Paternal Grandfather     CHF  . Osteoporosis Paternal Grandmother   . Arthritis Paternal Grandmother     rheumatoid   History  Substance Use Topics  . Smoking status: Never Smoker   . Smokeless tobacco: Never Used  . Alcohol Use: No  Review of Systems  Unable to perform ROS Gastrointestinal: Positive for diarrhea.  level V caveat applies to the review of systems. Information provided by parent    Allergies  Ambien; Codeine; Baclofen; and Sulfonamide derivatives  Home Medications   Prior to Admission medications   Medication Sig Start Date End Date Taking? Authorizing Provider  amoxicillin-clavulanate (AUGMENTIN) 875-125 MG per tablet Take 1 tablet by mouth 2 (two) times daily. 09/08/14  Yes Alice, MD  AMBULATORY NON FORMULARY MEDICATION Medication Name: MIC gastrostomy/bolus feeding tube 24 French Part number 0110-24. #2 and 10 cc lurer  lock syringe #2 Dx: 07/16/14   [name], MD  bacitracin 500 UNIT/GM ointment Apply 1 application topically 2 (two) times daily. 08/28/14   [name], MD  clotrimazole-betamethasone (LOTRISONE) cream Apply 1 application topically 2 (two) times daily. 03/27/14   [name], MD  dantrolene (DANTRIUM) 50 MG capsule Take 1 capsule (50 mg total) by mouth 2 (two) times daily. 1 tab bid and 2 tab at qhs Patient taking differently: Take 50 mg by mouth 3 (three) times daily.  08/28/14   Meredith Staggers, MD  diazepam (VALIUM) 2 MG tablet Take 1-2 tablets (2-4 mg total) by mouth at bedtime. 08/28/14   Meredith Staggers, MD  divalproex (DEPAKOTE SPRINKLE) 125 MG capsule Take 2 capsules in morning, 5 capsules at bedtime Patient taking differently: 250-625 mg. Take 2 capsules in morning, 5 capsules at bedtime 12/11/13   Cameron Sprang, MD  Feeding Tubes - Bags (FLEXIFLO FEEDING BAG/PUMP) MISC 1 Units by Does not apply route continuous. 05/28/14   [name], MD  Feeding Tubes - Pump MISC 1 Units by Does not apply route continuous. 05/28/14   [name], MD  Feeding Tubes - Sets [facility] EPUMP SET 1000ML) MISC 30 day supply of [place] PUmp set with flush bag 05/28/14   [name], MD  Incontinence Supplies (BARD LEG BAG STRAPS/FABRIC) MISC Bard Dispoz-a-Bag leg bag w/flip flo Valve, sterile, w/Gabric strap, 18" extension tubing 19 oz  Item #[id number] 07/30/14   [name], MD  Incontinence Supply Disposable (PREVAIL BREEZERS MEDIUM) MISC pkg of 16- size medium 32" to 44"  Breathable cloth-like outer fabric (can't use the plastic outer surgace  Item # [id number] 07/30/14   [name], MD  Lactobacillus (PROBIOTIC ACIDOPHILUS PO) Place 30 mLs into feeding tube daily.    Historical Provider, MD  levofloxacin (LEVAQUIN) 25 MG/ML solution Take 30 mLs (750 mg total) by mouth daily. 09/18/14   Fredia Sorrow, MD  LORazepam (ATIVAN) 1 MG tablet Take 1 tab po tid prn for anxiety 04/10/14    [name], MD  metoprolol tartrate (LOPRESSOR) 25 MG tablet Take 25 mg by mouth 2 (two) times daily. Palpitaitons, tachycardia 07/16/14   [name], MD  NON FORMULARY Bard Leg Bag Extension tubing w/Connector 18", Sterile, latex-free  Item# [id number]    Historical Provider, MD  NON FORMULARY Colorplast Freedom Cath Latex Self-Adhering Male External Catheter 67mm Diameter Intermediate  Item# [id number]    Historical Provider, MD  Nutritional Supplements (FEEDING SUPPLEMENT, JEVITY 1.5 CAL/FIBER,) LIQD Place 1,000 mLs into feeding tube 5 (five) times daily. 08/28/14   [name], MD  Nutritional Supplements (FEEDING SUPPLEMENT, OSMOLITE 1.5 CAL,) LIQD Place 1,000 mLs into feeding tube continuous. Osmolite 1.5 X 5 cans/day. Pump rate @100  ml/hr X 12 hrs a day 09/04/14   [name], MD  nystatin cream (MYCOSTATIN) Apply  1 application topically 2 (two) times daily as needed for dry skin. 12/01/13   [name], MD  OLANZapine (ZYPREXA) 5 MG tablet Take 5 mg by mouth at bedtime.    Historical Provider, MD  Omeprazole-Sodium Bicarbonate (ZEGERID) 20-1100 MG CAPS capsule Take 1 capsule by mouth daily before breakfast.    Historical Provider, MD  Ostomy Supplies (PROTECTIVE BARRIER WIPES) [place] 1-1/4" X 3"  Item #[id number] 07/30/14   [name], MD  oxybutynin (DITROPAN) 5 MG/5ML syrup Take 5 mg by mouth 2 (two) times daily.  06/14/14   Historical Provider, MD  PARoxetine (PAXIL) 10 MG tablet Place 15 mg into feeding tube at bedtime.    Historical Provider, MD  PARoxetine (PAXIL) 30 MG tablet tape down per neuro phyciatrist- 15 mg 04/10/14   [name], MD  PRESCRIPTION MEDICATION G-tube    Historical Provider, MD  [place] Cath Latex Self-Adhering Male External Catheter 59mm Diameter Intermediate  Item# [id number]    Historical Provider, MD  Probiotic Product (ADVANCED PROBIOTIC 10) CAPS 2 tablets. 2 tablespoons    Historical Provider, MD  sucralfate  (CARAFATE) 1 G tablet 1 tablet twice daily via PEG 01/29/14   [name], MD   BP 123/86 mmHg  Pulse 86  Temp(Src) 98.2 F (36.8 C) (Axillary)  Resp 18  SpO2 97% Physical Exam  Constitutional: He appears well-developed and well-nourished.  HENT:  Mouth/Throat: Oropharynx is clear and moist.  Mucous membranes are moist with some cracking of the lips.  Eyes: EOM are normal. Pupils are equal, round, and reactive to light.  Cardiovascular: Normal rate, regular rhythm and normal heart sounds.   Pulmonary/Chest: Effort normal and breath sounds normal. No respiratory distress. He has no rales.  Abdominal: Soft. Bowel sounds are normal. There is no tenderness.  G-tube in place.  Musculoskeletal: He exhibits no edema.  Neurological: He is alert.  Patient's mental status is baseline. Patient has cerebral palsy with quadriplegia.  Skin: No erythema.  Nursing note and vitals reviewed.   ED Course  Procedures (including critical care time) Labs Review Labs Reviewed  COMPREHENSIVE METABOLIC PANEL - Abnormal; Notable for the following:    Creatinine, Ser 0.40 (*)    Total Bilirubin <0.2 (*)    All other components within normal limits  CLOSTRIDIUM DIFFICILE BY PCR  CBC WITH DIFFERENTIAL   Results for orders placed or performed during the hospital encounter of 09/18/14  CBC with Differential  Result Value Ref Range   WBC 5.9 4.0 - 10.5 K/uL   RBC 4.75 4.22 - 5.81 MIL/uL   Hemoglobin 14.1 13.0 - 17.0 g/dL   HCT 43.1 39.0 - 52.0 %   MCV 90.7 78.0 - 100.0 fL   MCH 29.7 26.0 - 34.0 pg   MCHC 32.7 30.0 - 36.0 g/dL   RDW 12.7 11.5 - 15.5 %   Platelets 255 150 - 400 K/uL   Neutrophils Relative % 63 43 - 77 %   Neutro Abs 3.7 1.7 - 7.7 K/uL   Lymphocytes Relative 28 12 - 46 %   Lymphs Abs 1.7 0.7 - 4.0 K/uL   Monocytes Relative 6 3 - 12 %   Monocytes Absolute 0.4 0.1 - 1.0 K/uL   Eosinophils Relative 3 0 - 5 %   Eosinophils Absolute 0.2 0.0 - 0.7 K/uL   Basophils Relative 0 0 - 1 %    Basophils Absolute 0.0 0.0 - 0.1 K/uL  Comprehensive metabolic panel  Result Value Ref  Range   Sodium 145 137 - 147 mEq/L   Potassium 4.2 3.7 - 5.3 mEq/L   Chloride 104 96 - 112 mEq/L   CO2 30 19 - 32 mEq/L   Glucose, Bld 90 70 - 99 mg/dL   BUN 6 6 - 23 mg/dL   Creatinine, Ser 0.40 (L) 0.50 - 1.35 mg/dL   Calcium 9.7 8.4 - 10.5 mg/dL   Total Protein 7.8 6.0 - 8.3 g/dL   Albumin 3.5 3.5 - 5.2 g/dL   AST 20 0 - 37 U/L   ALT 15 0 - 53 U/L   Alkaline Phosphatase 85 39 - 117 U/L   Total Bilirubin <0.2 (L) 0.3 - 1.2 mg/dL   GFR calc non Af Amer >90 >90 mL/min   GFR calc Af Amer >90 >90 mL/min   Anion gap 11 5 - 15     Imaging Review Dg Chest 2 View  09/18/2014   CLINICAL DATA:  28 year old male with recent pneumonia. Diarrhea dehydration in cough. Initial encounter.  EXAM: CHEST  2 VIEW  COMPARISON:  09/05/2014 and earlier.  FINDINGS: Upright AP and lateral views of the chest. Extensive posterior spinal hardware partially re - identified. Stable lung volumes. Confluent right retrocardiac and lower lobe opacity persists. Small right pleural effusion suspected. Left lung stable and clear. No pneumothorax or pulmonary edema. Stable cardiac size and mediastinal contours. Disconnected appearing catheter or wire projecting over the medial right lung apex is in the posterior chest wall soft tissues as demonstrated on 09/06/2014 neck CT. No pneumoperitoneum. Negative visible bowel gas in the left upper quadrant.  IMPRESSION: 1. Persistent right lower lobe pneumonia with small pleural effusion. 2. No new cardiopulmonary abnormality identified.   Electronically Signed   By: Lars Pinks M.D.   On: 09/18/2014 12:56     EKG Interpretation None      MDM   Final diagnoses:  Cough  Aspiration pneumonia, unspecified aspiration pneumonia type  Diarrhea    Patient was admitted November 4-7 for an aspiration pneumonia. Was supposed to still be on Levaquin changed by primary care doctor from  Augmentin. But has not been getting that. The concern was the diarrhea. Possibility of C. Difficile is present however he is not having that frequent of bowel movements. Ruling out C. Difficile would be important. Labs ordered for that. If not can be done by home nurse. Patient's initial chest x-ray does show persistent pneumonia. Patient here is nontoxic no acute distress. Oxygen saturation are normal not tachycardic no hypotension. Patient looking well hydrated both clinically and by labs. We'll switch him over to the liquid Levaquin because it's  Easier to put down the G-tube. [name] will restart the Levaquin. They will follow-up with primary care doctor. Patient nontoxic no acute distress here today. No evidence of worsening pneumonia.    Fredia Sorrow, MD 09/18/14 1327

## 2014-09-19 ENCOUNTER — Telehealth: Payer: Self-pay | Admitting: Family Medicine

## 2014-09-19 DIAGNOSIS — Z431 Encounter for attention to gastrostomy: Secondary | ICD-10-CM | POA: Diagnosis not present

## 2014-09-19 DIAGNOSIS — R1312 Dysphagia, oropharyngeal phase: Secondary | ICD-10-CM | POA: Diagnosis not present

## 2014-09-19 DIAGNOSIS — E43 Unspecified severe protein-calorie malnutrition: Secondary | ICD-10-CM | POA: Diagnosis not present

## 2014-09-19 DIAGNOSIS — M419 Scoliosis, unspecified: Secondary | ICD-10-CM | POA: Diagnosis not present

## 2014-09-19 DIAGNOSIS — J984 Other disorders of lung: Secondary | ICD-10-CM | POA: Diagnosis not present

## 2014-09-19 DIAGNOSIS — G809 Cerebral palsy, unspecified: Secondary | ICD-10-CM | POA: Diagnosis not present

## 2014-09-19 LAB — CLOSTRIDIUM DIFFICILE BY PCR: Toxigenic C. Difficile by PCR: NEGATIVE

## 2014-09-19 NOTE — Telephone Encounter (Signed)
Caller name: meagan with [facility] Relation to pt: Call back number: [phone] Pharmacy:  Reason for call:   Will start seeing patient again and wants to know if Dr. Charlett Blake would sign orders. They will be seeing him twice a week for three weeks and once a week for two weeks.

## 2014-09-20 NOTE — Telephone Encounter (Signed)
Left a detailed message on [employer] vm and asked if anything else is needed to return my call

## 2014-09-20 NOTE — Telephone Encounter (Signed)
I wrote orders on 09/17/14 that should be good enough, please check with [name] and make sure they do not need anything else

## 2014-09-21 ENCOUNTER — Ambulatory Visit: Payer: Medicare Other | Admitting: Family Medicine

## 2014-09-21 ENCOUNTER — Ambulatory Visit: Payer: Medicare Other | Admitting: Adult Health

## 2014-09-21 DIAGNOSIS — M419 Scoliosis, unspecified: Secondary | ICD-10-CM | POA: Diagnosis not present

## 2014-09-21 DIAGNOSIS — Z431 Encounter for attention to gastrostomy: Secondary | ICD-10-CM | POA: Diagnosis not present

## 2014-09-21 DIAGNOSIS — E43 Unspecified severe protein-calorie malnutrition: Secondary | ICD-10-CM | POA: Diagnosis not present

## 2014-09-21 DIAGNOSIS — J984 Other disorders of lung: Secondary | ICD-10-CM | POA: Diagnosis not present

## 2014-09-21 DIAGNOSIS — G809 Cerebral palsy, unspecified: Secondary | ICD-10-CM | POA: Diagnosis not present

## 2014-09-21 DIAGNOSIS — R1312 Dysphagia, oropharyngeal phase: Secondary | ICD-10-CM | POA: Diagnosis not present

## 2014-09-25 DIAGNOSIS — M419 Scoliosis, unspecified: Secondary | ICD-10-CM | POA: Diagnosis not present

## 2014-09-25 DIAGNOSIS — Z431 Encounter for attention to gastrostomy: Secondary | ICD-10-CM | POA: Diagnosis not present

## 2014-09-25 DIAGNOSIS — G809 Cerebral palsy, unspecified: Secondary | ICD-10-CM | POA: Diagnosis not present

## 2014-09-25 DIAGNOSIS — J984 Other disorders of lung: Secondary | ICD-10-CM | POA: Diagnosis not present

## 2014-09-25 DIAGNOSIS — E43 Unspecified severe protein-calorie malnutrition: Secondary | ICD-10-CM | POA: Diagnosis not present

## 2014-09-25 DIAGNOSIS — R1312 Dysphagia, oropharyngeal phase: Secondary | ICD-10-CM | POA: Diagnosis not present

## 2014-10-03 ENCOUNTER — Telehealth: Payer: Self-pay | Admitting: *Deleted

## 2014-10-03 DIAGNOSIS — G809 Cerebral palsy, unspecified: Secondary | ICD-10-CM | POA: Diagnosis not present

## 2014-10-03 DIAGNOSIS — R1312 Dysphagia, oropharyngeal phase: Secondary | ICD-10-CM | POA: Diagnosis not present

## 2014-10-03 DIAGNOSIS — M419 Scoliosis, unspecified: Secondary | ICD-10-CM | POA: Diagnosis not present

## 2014-10-03 DIAGNOSIS — Z431 Encounter for attention to gastrostomy: Secondary | ICD-10-CM | POA: Diagnosis not present

## 2014-10-03 DIAGNOSIS — J984 Other disorders of lung: Secondary | ICD-10-CM | POA: Diagnosis not present

## 2014-10-03 DIAGNOSIS — E43 Unspecified severe protein-calorie malnutrition: Secondary | ICD-10-CM | POA: Diagnosis not present

## 2014-10-03 NOTE — Telephone Encounter (Signed)
Received physician interim order resumption of care via fax from [place]. Forms forwarded to Dr. Charlett Blake. JG//CMA

## 2014-10-03 NOTE — Telephone Encounter (Signed)
Received home health certification and plan of care via fax from [place]. Forms forwarded to Dr. Charlett Blake. JG//CMA

## 2014-10-07 DIAGNOSIS — R1312 Dysphagia, oropharyngeal phase: Secondary | ICD-10-CM

## 2014-10-08 NOTE — Telephone Encounter (Signed)
Signed forms faxed to [place] at [phone]. Fax confirmation received. JG//CMA

## 2014-10-09 ENCOUNTER — Ambulatory Visit: Payer: Medicare Other | Admitting: Internal Medicine

## 2014-10-11 DIAGNOSIS — Z431 Encounter for attention to gastrostomy: Secondary | ICD-10-CM | POA: Diagnosis not present

## 2014-10-11 DIAGNOSIS — G809 Cerebral palsy, unspecified: Secondary | ICD-10-CM | POA: Diagnosis not present

## 2014-10-11 DIAGNOSIS — R1312 Dysphagia, oropharyngeal phase: Secondary | ICD-10-CM | POA: Diagnosis not present

## 2014-10-11 DIAGNOSIS — M419 Scoliosis, unspecified: Secondary | ICD-10-CM | POA: Diagnosis not present

## 2014-10-11 DIAGNOSIS — J984 Other disorders of lung: Secondary | ICD-10-CM | POA: Diagnosis not present

## 2014-10-11 DIAGNOSIS — E43 Unspecified severe protein-calorie malnutrition: Secondary | ICD-10-CM | POA: Diagnosis not present

## 2014-10-12 ENCOUNTER — Telehealth: Payer: Self-pay

## 2014-10-12 NOTE — Telephone Encounter (Signed)
Concern for dehydration.  Needs to be seen -- [facility] or [facility].

## 2014-10-12 NOTE — Telephone Encounter (Signed)
Called and informed patient's mother. She verbalized understanding. JG//CMA

## 2014-10-12 NOTE — Telephone Encounter (Signed)
Please advise. JG//CMA

## 2014-10-12 NOTE — Telephone Encounter (Signed)
[name] Mother [phone]  [name] called to say that Marcques's blood pressure has been dropping, today at 10 am it was 90/60 and yesterday it was 70/40's. She has been giving him extra fluids trying to avoid dehydration. Could someone call her back

## 2014-10-15 ENCOUNTER — Telehealth: Payer: Self-pay | Admitting: Family Medicine

## 2014-10-15 NOTE — Telephone Encounter (Signed)
Caller name: Jamey Reas from [facility]   Relation to pt: other Call back number: [phone]   Reason for call:  [facility] called to follow up on paperwork that was faxed on 09/14/14 regarding urine catheter, as per repsentative paperwork was faxed 09/14/14 for the second time. Follow up on dec 2 stated paperwork was received and awaiting MD signature. As per repsentaive mother called today checking on the stauts. Please advise

## 2014-10-15 NOTE — Telephone Encounter (Signed)
Paperwork is still with Dr. Charlett Blake. JG//CMA

## 2014-10-15 NOTE — Telephone Encounter (Signed)
Caller name: joy with [facility] Relation to pt: Call back number: [phone] Pharmacy:  Reason for call:   Requesting orders for nursing to continue visits with patient for once a week for 5 weeks and to recertification with agency.

## 2014-10-16 ENCOUNTER — Encounter: Payer: Self-pay | Admitting: Family Medicine

## 2014-10-16 ENCOUNTER — Ambulatory Visit: Payer: Medicare Other | Admitting: Family Medicine

## 2014-10-16 ENCOUNTER — Ambulatory Visit (INDEPENDENT_AMBULATORY_CARE_PROVIDER_SITE_OTHER): Payer: Medicare Other | Admitting: Family Medicine

## 2014-10-16 ENCOUNTER — Ambulatory Visit (HOSPITAL_BASED_OUTPATIENT_CLINIC_OR_DEPARTMENT_OTHER)
Admission: RE | Admit: 2014-10-16 | Discharge: 2014-10-16 | Disposition: A | Discharge status: Home / Self Care | Payer: Medicare Other | Source: Ambulatory Visit | Attending: Family Medicine | Admitting: Family Medicine

## 2014-10-16 VITALS — HR 97 | Temp 98.6°F

## 2014-10-16 DIAGNOSIS — G809 Cerebral palsy, unspecified: Secondary | ICD-10-CM | POA: Diagnosis not present

## 2014-10-16 DIAGNOSIS — R197 Diarrhea, unspecified: Secondary | ICD-10-CM | POA: Insufficient documentation

## 2014-10-16 DIAGNOSIS — R159 Full incontinence of feces: Secondary | ICD-10-CM | POA: Diagnosis not present

## 2014-10-16 DIAGNOSIS — Z8719 Personal history of other diseases of the digestive system: Secondary | ICD-10-CM | POA: Diagnosis not present

## 2014-10-16 DIAGNOSIS — G808 Other cerebral palsy: Secondary | ICD-10-CM

## 2014-10-16 DIAGNOSIS — R109 Unspecified abdominal pain: Secondary | ICD-10-CM | POA: Insufficient documentation

## 2014-10-16 DIAGNOSIS — F418 Other specified anxiety disorders: Secondary | ICD-10-CM

## 2014-10-16 DIAGNOSIS — Z967 Presence of other bone and tendon implants: Secondary | ICD-10-CM | POA: Diagnosis not present

## 2014-10-16 DIAGNOSIS — Z931 Gastrostomy status: Secondary | ICD-10-CM | POA: Insufficient documentation

## 2014-10-16 DIAGNOSIS — K219 Gastro-esophageal reflux disease without esophagitis: Secondary | ICD-10-CM | POA: Diagnosis not present

## 2014-10-16 NOTE — Progress Notes (Signed)
Pre visit review using our clinic review tool, if applicable. No additional management support is needed unless otherwise documented below in the visit note. 

## 2014-10-16 NOTE — Telephone Encounter (Signed)
OK to give verbal order for services requested then they can send a paper copy for me to sign

## 2014-10-16 NOTE — Patient Instructions (Signed)

## 2014-10-17 ENCOUNTER — Telehealth: Payer: Self-pay | Admitting: Family Medicine

## 2014-10-17 DIAGNOSIS — J984 Other disorders of lung: Secondary | ICD-10-CM | POA: Diagnosis not present

## 2014-10-17 DIAGNOSIS — R1312 Dysphagia, oropharyngeal phase: Secondary | ICD-10-CM | POA: Diagnosis not present

## 2014-10-17 DIAGNOSIS — E43 Unspecified severe protein-calorie malnutrition: Secondary | ICD-10-CM | POA: Diagnosis not present

## 2014-10-17 DIAGNOSIS — Z431 Encounter for attention to gastrostomy: Secondary | ICD-10-CM | POA: Diagnosis not present

## 2014-10-17 DIAGNOSIS — M419 Scoliosis, unspecified: Secondary | ICD-10-CM | POA: Diagnosis not present

## 2014-10-17 DIAGNOSIS — G809 Cerebral palsy, unspecified: Secondary | ICD-10-CM | POA: Diagnosis not present

## 2014-10-17 LAB — HEPATIC FUNCTION PANEL
ALT: 23 U/L (ref 0–53)
AST: 25 U/L (ref 0–37)
Albumin: 4.5 g/dL (ref 3.5–5.2)
Alkaline Phosphatase: 84 U/L (ref 39–117)
Bilirubin, Direct: 0.1 mg/dL (ref 0.0–0.3)
Total Bilirubin: 0.4 mg/dL (ref 0.2–1.2)
Total Protein: 8.5 g/dL — ABNORMAL HIGH (ref 6.0–8.3)

## 2014-10-17 LAB — CBC
HCT: 47 % (ref 39.0–52.0)
Hemoglobin: 15.5 g/dL (ref 13.0–17.0)
MCHC: 33 g/dL (ref 30.0–36.0)
MCV: 89.7 fl (ref 78.0–100.0)
Platelets: 252 10*3/uL (ref 150.0–400.0)
RBC: 5.24 Mil/uL (ref 4.22–5.81)
RDW: 13.6 % (ref 11.5–15.5)
WBC: 7 10*3/uL (ref 4.0–10.5)

## 2014-10-17 LAB — RENAL FUNCTION PANEL
Albumin: 4.5 g/dL (ref 3.5–5.2)
BUN: 10 mg/dL (ref 6–23)
CO2: 28 mEq/L (ref 19–32)
Calcium: 9.5 mg/dL (ref 8.4–10.5)
Chloride: 104 mEq/L (ref 96–112)
Creatinine, Ser: 0.4 mg/dL (ref 0.4–1.5)
GFR: 259.89 mL/min (ref >60.00)
Glucose, Bld: 85 mg/dL (ref 70–99)
Phosphorus: 3.9 mg/dL (ref 2.3–4.6)
Potassium: 4 mEq/L (ref 3.5–5.1)
Sodium: 139 mEq/L (ref 135–145)

## 2014-10-17 NOTE — Telephone Encounter (Signed)
correct 

## 2014-10-17 NOTE — Telephone Encounter (Signed)
Caller name: joy with [facility] Relation to pt: Call back number: [phone] Pharmacy:  Reason for call:   Requesting verbal orders for skilled nursing to continue to see the patient for twice a week for one week and then once for three weeks.

## 2014-10-17 NOTE — Telephone Encounter (Signed)
Advised ok to continue.

## 2014-10-18 ENCOUNTER — Encounter: Payer: Self-pay | Admitting: Physical Medicine & Rehabilitation

## 2014-10-18 ENCOUNTER — Encounter: Payer: Self-pay | Admitting: Pulmonary Disease

## 2014-10-18 ENCOUNTER — Encounter: Payer: Self-pay | Admitting: Family Medicine

## 2014-10-18 DIAGNOSIS — F71 Moderate intellectual disabilities: Secondary | ICD-10-CM | POA: Diagnosis not present

## 2014-10-18 DIAGNOSIS — F411 Generalized anxiety disorder: Secondary | ICD-10-CM | POA: Diagnosis not present

## 2014-10-18 DIAGNOSIS — F339 Major depressive disorder, recurrent, unspecified: Secondary | ICD-10-CM | POA: Diagnosis not present

## 2014-10-19 ENCOUNTER — Encounter: Payer: Self-pay | Admitting: Physical Medicine & Rehabilitation

## 2014-10-19 ENCOUNTER — Telehealth: Payer: Self-pay | Admitting: Family Medicine

## 2014-10-19 ENCOUNTER — Ambulatory Visit: Payer: Medicare Other | Admitting: Family Medicine

## 2014-10-19 DIAGNOSIS — R32 Unspecified urinary incontinence: Secondary | ICD-10-CM | POA: Diagnosis not present

## 2014-10-19 DIAGNOSIS — G800 Spastic quadriplegic cerebral palsy: Secondary | ICD-10-CM | POA: Diagnosis not present

## 2014-10-19 DIAGNOSIS — F0390 Unspecified dementia without behavioral disturbance: Secondary | ICD-10-CM | POA: Diagnosis not present

## 2014-10-19 DIAGNOSIS — F341 Dysthymic disorder: Secondary | ICD-10-CM | POA: Diagnosis not present

## 2014-10-19 DIAGNOSIS — E039 Hypothyroidism, unspecified: Secondary | ICD-10-CM | POA: Diagnosis not present

## 2014-10-19 DIAGNOSIS — R1312 Dysphagia, oropharyngeal phase: Secondary | ICD-10-CM | POA: Diagnosis not present

## 2014-10-19 NOTE — Telephone Encounter (Signed)
Caller name: [name] Relation to pt: mom Call back number: [phone] Pharmacy:  Reason for call:   Patient mom is requesting that we place that referral to [facility]. Neuro would like [place] to fax office notes to [phone]. Patient mom would also like a callback about patients declining health and low blood pressure.

## 2014-10-20 ENCOUNTER — Encounter: Payer: Self-pay | Admitting: Family Medicine

## 2014-10-20 ENCOUNTER — Encounter: Payer: Self-pay | Admitting: Physical Medicine & Rehabilitation

## 2014-10-22 ENCOUNTER — Encounter: Payer: Self-pay | Admitting: Family Medicine

## 2014-10-22 NOTE — Telephone Encounter (Signed)
Spoke with mother who states that his BP has returned to a normal level. Her main concerns were that she felt he is declining in health and she did not think CP was a disease that this would be evident. For now she states that she is good and has an appt scheduled with you on 11/06/2014. She plans to enjoy the Christmas week and hopes you do as well.

## 2014-10-22 NOTE — Assessment & Plan Note (Signed)
Diarrhea has increased again, Mom is working hard to replace fluids with good results his labs do not suggest dehydration.

## 2014-10-22 NOTE — Telephone Encounter (Signed)
Please check with mother and see how they are doing. See what her concerns are or what she thinks I can help with, I will consider calling tomorrow when I am back in office if she thinks I can help. THX

## 2014-10-22 NOTE — Assessment & Plan Note (Signed)
Worsening over all health, referred for consideration to new neurologist.

## 2014-10-22 NOTE — Progress Notes (Signed)
[name]  [id number] 01/18/1986 10/22/2014      Progress Note-Follow Up  Subjective  Chief Complaint  Chief Complaint  Patient presents with  . Blood Pressure Check    BP running low.  . Dry lips    HPI  Patient is a 28 y.o. male in today for routine medical care. Patient in today with his mother. Agitated during visit. Certainly give vital signs and exam. Can sit only for minutes and becomes agitated during conversation. Has been struggling with some increased diarrhea. Mom has been able to increase his feeds and water down his PEG tube and he has continued to urinate normally. No fevers or chills. Mom reports at home his blood pressures low.  Past Medical History  Diagnosis Date  . Cerebral palsy   . GERD (gastroesophageal reflux disease)   . Hyperthyroidism   . Incontinence of feces   . Palpitations   . Esophagitis 2011  . Dehydration 11/22/2013  . Depression with anxiety 08/01/2010    Qualifier: Diagnosis of  By: Nelson-Smith CMA (AAMA), Dottie    . Loss of weight 08/28/2014  . Depression with anxiety 08/01/2010    Qualifier: Diagnosis of  By: Nelson-Smith CMA (AAMA), Dottie      Past Surgical History  Procedure Laterality Date  . Spine surgery  ,11/20/2010, 2011    for correction of severe contracturing spinal kyphosis.   . Eye surgery    . Ears tubes    . Hamstring released      to treat contractures.   . Baclofen trial    . Baslofen pump implant    . Spinal fusion    . G-tube insert  August 2006  . Spinal fusioncorrect 106 degree kyphosis    . Spinal fusion to correct 70 degree kyphosis  11-2010  . Tonsillectomy    . Peg placement  10/21/2011    Procedure: PERCUTANEOUS ENDOSCOPIC GASTROSTOMY (PEG) REPLACEMENT;  Surgeon: [name], MD;  Location: [facility] ENDOSCOPY;  Service: Endoscopy;  Laterality: N/A;  . Peg placement N/A 06/13/2013    Procedure: PERCUTANEOUS ENDOSCOPIC GASTROSTOMY (PEG) REPLACEMENT;  Surgeon: [name], MD;  Location: [facility]  ENDOSCOPY;  Service: Endoscopy;  Laterality: N/A;  . Hip surgery      x2 , side   . Flexible sigmoidoscopy N/A 09/07/2014    Procedure: FLEXIBLE SIGMOIDOSCOPY;  Surgeon: [name], MD;  Location: [facility] ENDOSCOPY;  Service: Endoscopy;  Laterality: N/A;    Family History  Problem Relation Age of Onset  . Asthma Mother   . Hyperlipidemia Mother   . COPD Mother   . Other Mother     bronchial stasis/ABPA  . Cancer Maternal Grandmother 10    breast  . Hyperlipidemia Maternal Grandmother   . Hypertension Maternal Grandmother   . Cancer Maternal Grandfather     prostate  . Heart disease Paternal Grandfather     CHF  . Osteoporosis Paternal Grandmother   . Arthritis Paternal Grandmother     rheumatoid    History   Social History  . Marital Status: Single    Spouse Name: N/A    Number of Children: 0  . Years of Education: N/A   Occupational History  . disbaled    Social History Main Topics  . Smoking status: Never Smoker   . Smokeless tobacco: Never Used  . Alcohol Use: No  . Drug Use: No  . Sexual Activity: No   Other Topics Concern  . Not on file  Social History Narrative    Current Outpatient Prescriptions on File Prior to Visit  Medication Sig Dispense Refill  . AMBULATORY NON FORMULARY MEDICATION Medication Name: MIC gastrostomy/bolus feeding tube 24 French Part number 0110-24. #2 and 10 cc lurer lock syringe #2 Dx: 2 Device 1  . bacitracin 500 UNIT/GM ointment Apply 1 application topically 2 (two) times daily. 30 g 1  . clotrimazole-betamethasone (LOTRISONE) cream Apply 1 application topically 2 (two) times daily. 45 g 1  . dantrolene (DANTRIUM) 50 MG capsule Take 1 capsule (50 mg total) by mouth 2 (two) times daily. 1 tab bid and 2 tab at qhs (Patient taking differently: Take 50 mg by mouth 3 (three) times daily. ) 60 capsule 4  . diazepam (VALIUM) 2 MG tablet Take 1-2 tablets (2-4 mg total) by mouth at bedtime. 60 tablet 2  . divalproex (DEPAKOTE SPRINKLE)  125 MG capsule Take 2 capsules in morning, 5 capsules at bedtime (Patient taking differently: 250-625 mg. Take 2 capsules in morning, 5 capsules at bedtime) 210 capsule 4  . Feeding Tubes - Bags (FLEXIFLO FEEDING BAG/PUMP) MISC 1 Units by Does not apply route continuous. 1 each 30  . Feeding Tubes - Pump MISC 1 Units by Does not apply route continuous. 1 each 0  . Feeding Tubes - Sets (KANGAROO EPUMP SET 1000ML) MISC 30 day supply of Kangaroo Joey PUmp set with flush bag 1 each 5  . Incontinence Supplies (BARD LEG BAG STRAPS/FABRIC) MISC Bard Dispoz-a-Bag leg bag w/flip flo Valve, sterile, w/Gabric strap, 18" extension tubing 19 oz  Item #[id number] 2 each 6  . Incontinence Supply Disposable (PREVAIL BREEZERS MEDIUM) MISC pkg of 16- size medium 32" to 44"  Breathable cloth-like outer fabric (can't use the plastic outer surgace  Item # [id number] 16 each 6  . Lactobacillus (PROBIOTIC ACIDOPHILUS PO) Place 30 mLs into feeding tube daily.    Marland Kitchen levofloxacin (LEVAQUIN) 25 MG/ML solution Take 30 mLs (750 mg total) by mouth daily. 75 mL 0  . LORazepam (ATIVAN) 1 MG tablet Take 1 tab po tid prn for anxiety 90 tablet 1  . metoprolol tartrate (LOPRESSOR) 25 MG tablet Take 25 mg by mouth 2 (two) times daily. Palpitaitons, tachycardia    . NON FORMULARY Bard Leg Bag Extension tubing w/Connector 18", Sterile, latex-free  Item# [id number]    . NON FORMULARY Colorplast Freedom Cath Latex Self-Adhering Male External Catheter 18mm Diameter Intermediate  Item# [id number]    . Nutritional Supplements (FEEDING SUPPLEMENT, JEVITY 1.5 CAL/FIBER,) LIQD Place 1,000 mLs into feeding tube 5 (five) times daily. 15550 mL 11  . Nutritional Supplements (FEEDING SUPPLEMENT, OSMOLITE 1.5 CAL,) LIQD Place 1,000 mLs into feeding tube continuous. Osmolite 1.5 X 5 cans/day. Pump rate @100  ml/hr X 12 hrs a day 35550 mL 3  . nystatin cream (MYCOSTATIN) Apply 1 application topically 2 (two) times daily as needed for dry skin. 30 g 1  .  OLANZapine (ZYPREXA) 5 MG tablet Take 5 mg by mouth at bedtime.    Earney Navy Bicarbonate (ZEGERID) 20-1100 MG CAPS capsule Take 1 capsule by mouth daily before breakfast.    . Ostomy Supplies (PROTECTIVE BARRIER WIPES) MISC 1-1/4" X 3"  Item #[id number] 75 each 6  . oxybutynin (DITROPAN) 5 MG/5ML syrup Take 5 mg by mouth 2 (two) times daily.     Marland Kitchen PARoxetine (PAXIL) 10 MG tablet Place 15 mg into feeding tube at bedtime.    Marland Kitchen PARoxetine (PAXIL) 30 MG tablet tape down per neuro phyciatrist- 15 mg    .  PRESCRIPTION MEDICATION G-tube    . PRESCRIPTION MEDICATION Colorplast Freedom Cath Latex Self-Adhering Male External Catheter 10mm Diameter Intermediate  Item# [id number]    . Probiotic Product (ADVANCED PROBIOTIC 10) CAPS 2 tablets. 2 tablespoons    . sucralfate (CARAFATE) 1 G tablet 1 tablet twice daily via PEG 60 tablet 1  . amoxicillin-clavulanate (AUGMENTIN) 875-125 MG per tablet Take 1 tablet by mouth 2 (two) times daily. (Patient not taking: Reported on 10/16/2014) 10 tablet 0   No current facility-administered medications on file prior to visit.    Allergies  Allergen Reactions  . Ambien [Zolpidem Tartrate] Nausea Only  . Codeine   . Baclofen Anxiety  . Sulfonamide Derivatives Rash    Review of Systems  Review of Systems  Constitutional: Positive for weight loss. Negative for fever and malaise/fatigue.  HENT: Negative for congestion.   Eyes: Negative for discharge.  Respiratory: Negative for shortness of breath.   Cardiovascular: Negative for chest pain, palpitations and leg swelling.  Gastrointestinal: Positive for abdominal pain and diarrhea. Negative for nausea.  Genitourinary: Negative for dysuria.  Musculoskeletal: Negative for falls.  Skin: Negative for rash.  Neurological: Negative for loss of consciousness and headaches.  Endo/Heme/Allergies: Negative for polydipsia.  Psychiatric/Behavioral: Negative for depression and suicidal ideas. The patient is  nervous/anxious. The patient does not have insomnia.     Objective  Pulse 97  Temp(Src) 98.6 F (37 C) (Tympanic)  SpO2 98%  Physical Exam  Physical Exam  Constitutional: He is oriented to person, place, and time and well-developed, well-nourished, and in no distress. No distress.  HENT:  Head: Normocephalic and atraumatic.  Eyes: Conjunctivae are normal.  Neck: Neck supple. No thyromegaly present.  Cardiovascular: Normal rate, regular rhythm and normal heart sounds.   No murmur heard. Pulmonary/Chest: Effort normal and breath sounds normal. No respiratory distress.  Abdominal: He exhibits no distension and no mass. There is no tenderness.  PEG tube in place, is  Leaking some,   Musculoskeletal: He exhibits no edema.  Neurological: He is alert and oriented to person, place, and time.  Skin: Skin is warm.  Psychiatric:  Agitated, yelling.    Lab Results  Component Value Date   TSH 0.774 06/08/2014   Lab Results  Component Value Date   WBC 7.0 10/16/2014   HGB 15.5 10/16/2014   HCT 47.0 10/16/2014   MCV 89.7 10/16/2014   PLT 252.0 10/16/2014   Lab Results  Component Value Date   CREATININE 0.4 10/16/2014   BUN 10 10/16/2014   NA 139 10/16/2014   K 4.0 10/16/2014   CL 104 10/16/2014   CO2 28 10/16/2014   Lab Results  Component Value Date   ALT 23 10/16/2014   AST 25 10/16/2014   ALKPHOS 84 10/16/2014   BILITOT 0.4 10/16/2014   Lab Results  Component Value Date   CHOL 151 04/25/2010   Lab Results  Component Value Date   HDL 29.90* 04/25/2010   Lab Results  Component Value Date   LDLCALC 96 04/25/2010   Lab Results  Component Value Date   TRIG 125.0 04/25/2010   Lab Results  Component Value Date   CHOLHDL 5 04/25/2010     Assessment & Plan  Incontinence of feces Diarrhea has increased again, Mom is working hard to replace fluids with good results his labs do not suggest dehydration.   Depression with anxiety Patient very agitated  intermittently during visit, not very cooperative during discussions. Showing significant declines in his overall health. Marland Kitchen  May need adjustment to meds if persists.   PALSY, INFANTILE CEREBRAL, QUADRIPLEGIC Worsening over all health, referred for consideration to new neurologist.

## 2014-10-22 NOTE — Assessment & Plan Note (Signed)
Patient very agitated intermittently during visit, not very cooperative during discussions. Showing significant declines in his overall health. . May need adjustment to meds if persists.

## 2014-10-23 DIAGNOSIS — R1312 Dysphagia, oropharyngeal phase: Secondary | ICD-10-CM | POA: Diagnosis not present

## 2014-10-23 DIAGNOSIS — R32 Unspecified urinary incontinence: Secondary | ICD-10-CM | POA: Diagnosis not present

## 2014-10-23 DIAGNOSIS — F341 Dysthymic disorder: Secondary | ICD-10-CM | POA: Diagnosis not present

## 2014-10-23 DIAGNOSIS — G800 Spastic quadriplegic cerebral palsy: Secondary | ICD-10-CM | POA: Diagnosis not present

## 2014-10-23 DIAGNOSIS — E039 Hypothyroidism, unspecified: Secondary | ICD-10-CM | POA: Diagnosis not present

## 2014-10-23 DIAGNOSIS — F0390 Unspecified dementia without behavioral disturbance: Secondary | ICD-10-CM | POA: Diagnosis not present

## 2014-10-23 NOTE — Telephone Encounter (Signed)
Thanks

## 2014-10-23 NOTE — Telephone Encounter (Signed)
Already been completed by another provider .

## 2014-10-30 ENCOUNTER — Other Ambulatory Visit: Payer: Self-pay | Admitting: Family Medicine

## 2014-10-31 ENCOUNTER — Encounter: Payer: Self-pay | Admitting: Family Medicine

## 2014-10-31 DIAGNOSIS — F341 Dysthymic disorder: Secondary | ICD-10-CM | POA: Diagnosis not present

## 2014-10-31 DIAGNOSIS — G800 Spastic quadriplegic cerebral palsy: Secondary | ICD-10-CM | POA: Diagnosis not present

## 2014-10-31 DIAGNOSIS — E039 Hypothyroidism, unspecified: Secondary | ICD-10-CM | POA: Diagnosis not present

## 2014-10-31 DIAGNOSIS — F0390 Unspecified dementia without behavioral disturbance: Secondary | ICD-10-CM | POA: Diagnosis not present

## 2014-10-31 DIAGNOSIS — R1312 Dysphagia, oropharyngeal phase: Secondary | ICD-10-CM | POA: Diagnosis not present

## 2014-10-31 DIAGNOSIS — R32 Unspecified urinary incontinence: Secondary | ICD-10-CM | POA: Diagnosis not present

## 2014-10-31 NOTE — Telephone Encounter (Signed)
Caller name: Alex  [facility]  Relation to pt: other  Call back number: [phone]    Reason for call:  [facility] called to follow up on paperwork regarding urine catheter. Representative states pt is running out of supplies.

## 2014-11-01 ENCOUNTER — Encounter: Payer: Self-pay | Admitting: Family Medicine

## 2014-11-01 NOTE — Telephone Encounter (Signed)
Called mother to verify frequency of in and out caths.  No answer.  Left message for call back.

## 2014-11-05 ENCOUNTER — Other Ambulatory Visit: Payer: Self-pay | Admitting: Physical Medicine & Rehabilitation

## 2014-11-06 ENCOUNTER — Encounter: Payer: Self-pay | Admitting: Family Medicine

## 2014-11-06 ENCOUNTER — Ambulatory Visit (INDEPENDENT_AMBULATORY_CARE_PROVIDER_SITE_OTHER): Payer: Medicare Other | Admitting: Family Medicine

## 2014-11-06 ENCOUNTER — Ambulatory Visit: Payer: Medicare Other | Admitting: Internal Medicine

## 2014-11-06 VITALS — BP 110/75 | HR 90 | Temp 97.8°F | Wt 99.0 lb

## 2014-11-06 DIAGNOSIS — R1310 Dysphagia, unspecified: Secondary | ICD-10-CM | POA: Diagnosis not present

## 2014-11-06 DIAGNOSIS — R634 Abnormal weight loss: Secondary | ICD-10-CM

## 2014-11-06 DIAGNOSIS — G809 Cerebral palsy, unspecified: Secondary | ICD-10-CM | POA: Diagnosis not present

## 2014-11-06 DIAGNOSIS — F418 Other specified anxiety disorders: Secondary | ICD-10-CM

## 2014-11-06 DIAGNOSIS — Z79899 Other long term (current) drug therapy: Secondary | ICD-10-CM | POA: Diagnosis not present

## 2014-11-06 DIAGNOSIS — E43 Unspecified severe protein-calorie malnutrition: Secondary | ICD-10-CM

## 2014-11-06 DIAGNOSIS — R32 Unspecified urinary incontinence: Secondary | ICD-10-CM

## 2014-11-06 DIAGNOSIS — R1314 Dysphagia, pharyngoesophageal phase: Secondary | ICD-10-CM | POA: Diagnosis not present

## 2014-11-06 MED ORDER — AMBULATORY NON FORMULARY MEDICATION: Status: DC

## 2014-11-06 MED ORDER — PAROXETINE HCL 10 MG PO TABS: 10.0000 mg | ORAL_TABLET | Freq: Every day | ORAL | Status: DC

## 2014-11-06 MED ORDER — JEVITY 1.5 CAL/FIBER PO LIQD: 1000.0000 mL | Freq: Every day | ORAL | Status: DC

## 2014-11-06 NOTE — Progress Notes (Signed)
Pre visit review using our clinic review tool, if applicable. No additional management support is needed unless otherwise documented below in the visit note. 

## 2014-11-06 NOTE — Telephone Encounter (Addendum)
Spoke with mother and she stated that patient uses condom catheters instead of in and out caths.  She said she uses approximately 2 per day and about 60 per month.  The same was written on supply form.  Mother is concerned about why we are having to do the paperwork over again.  She said that she has been trying to get supplies for [place] since September.  She shared that she even brought patient in back in September and had forms filled out then and asked what happened to the paperwork?  No copies of paperwork found in chart.  Paperwork on hand indicate that it was faxed to our office on 10/30/14.  She asked to speak to [place] Johnson, office manager regarding the situation.  Meanwhile, the mother was assured that a copy of the completed forms would be faxed and sent to scanning for permanent record, and the original copy will be given to her during the office visit today.  Mother stated understanding and agreed with plan.  Call transferred to [place].

## 2014-11-06 NOTE — Telephone Encounter (Signed)
Original copy given to Kirtland Bouchard, [place] for Dr. Charlett Blake.  Fax confirmation received indicating that fax went through.  A copy of paperwork sent off for scanning.

## 2014-11-06 NOTE — Patient Instructions (Addendum)
Florinef is a possibility for BP Try Gatorade   Postural Orthostatic Tachycardia Syndrome Postural orthostatic tachycardia syndrome (POTS) is an increased heart rate when going from a lying (supine) position to a standing position. The heart rate may increase more than 30 beats per minute (BPM) above its resting rate when going from a lying to a standing position. POTS occurs more frequently in women than in men.  SYMPTOMS  POTS symptoms may be increased in the morning. Symptoms of POTS include:  Fainting or near fainting.  Inability to think clearly.  Extreme or chronic fatigue.  Exercise intolerance.  Chest pain.  Having the lower legs develop a reddish-blue color due to decreased blood flow (acrocyanosis). CAUSES POTS can be caused by different conditions. Sometimes, it has no known cause (idiopathic). Some causes of POTS include:  Viral illness.  Pregnancy.  Autoimmune diseases.  Medications.  Major surgery.  Trauma such as a car accident or major injury.  Medical conditions such as anemia, dehydration, and hyperthyroidism. DIAGNOSIS  POTS is diagnosed by:  Taking a complete history and physical exam.  Measuring the heart rate while lying and then upon standing.  Measuring blood pressure when going from a lying to a standing position. POTS is usually not associated with low blood pressure (orthostatic hypotension) when going from a lying to standing position. While standing, blood pressure should be taken 2, 5, and 10 minutes after getting up. TREATMENT  Treatment of POTS depends upon the severity of the symptoms. Treatment includes:  Drinking plenty of fluids to avoid getting dehydrated.  Avoiding very hot environments to not get overheated.  Increasing your dietary salt intake as instructed by your caregiver.  Taking different types of medications as prescribed for POTS.  Avoiding some classes of medications such as vasodilators and diuretics. SEEK  IMMEDIATE MEDICAL CARE IF  You have severe chest pain that does not go away. Call your local emergency service immediately.  You feel your heart racing or beating rapidly.  You feel like passing out.  You have very confused thinking. MAKE SURE YOU  Understand these instructions.  Will watch your condition.  Will get help right away if you are not doing well or get worse. Document Released: 10/09/2002 Document Revised: 03/05/2014 Document Reviewed: 12/17/2010 [facility] Patient Information 2015 [place], [place]. This information is not intended to replace advice given to you by your health care provider. Make sure you discuss any questions you have with your health care provider.

## 2014-11-07 ENCOUNTER — Encounter: Payer: Self-pay | Admitting: Family Medicine

## 2014-11-07 ENCOUNTER — Encounter: Payer: Self-pay | Admitting: Pulmonary Disease

## 2014-11-07 DIAGNOSIS — G800 Spastic quadriplegic cerebral palsy: Secondary | ICD-10-CM | POA: Diagnosis not present

## 2014-11-07 DIAGNOSIS — E039 Hypothyroidism, unspecified: Secondary | ICD-10-CM | POA: Diagnosis not present

## 2014-11-07 DIAGNOSIS — F341 Dysthymic disorder: Secondary | ICD-10-CM | POA: Diagnosis not present

## 2014-11-07 DIAGNOSIS — R1312 Dysphagia, oropharyngeal phase: Secondary | ICD-10-CM | POA: Diagnosis not present

## 2014-11-07 DIAGNOSIS — R32 Unspecified urinary incontinence: Secondary | ICD-10-CM | POA: Diagnosis not present

## 2014-11-07 DIAGNOSIS — F0390 Unspecified dementia without behavioral disturbance: Secondary | ICD-10-CM | POA: Diagnosis not present

## 2014-11-07 LAB — VALPROIC ACID LEVEL: Valproic Acid Lvl: 60 ug/mL (ref 50.0–100.0)

## 2014-11-09 ENCOUNTER — Telehealth: Payer: Self-pay

## 2014-11-09 DIAGNOSIS — Z4782 Encounter for orthopedic aftercare following scoliosis surgery: Secondary | ICD-10-CM | POA: Diagnosis not present

## 2014-11-09 DIAGNOSIS — M4003 Postural kyphosis, cervicothoracic region: Secondary | ICD-10-CM | POA: Diagnosis not present

## 2014-11-09 DIAGNOSIS — G800 Spastic quadriplegic cerebral palsy: Secondary | ICD-10-CM | POA: Diagnosis not present

## 2014-11-09 DIAGNOSIS — R1314 Dysphagia, pharyngoesophageal phase: Secondary | ICD-10-CM

## 2014-11-09 DIAGNOSIS — R634 Abnormal weight loss: Secondary | ICD-10-CM

## 2014-11-09 DIAGNOSIS — G809 Cerebral palsy, unspecified: Secondary | ICD-10-CM

## 2014-11-09 MED ORDER — AMBULATORY NON FORMULARY MEDICATION: Status: DC

## 2014-11-09 MED ORDER — JEVITY 1.5 CAL/FIBER PO LIQD: 1000.0000 mL | Freq: Every day | ORAL | Status: DC

## 2014-11-09 NOTE — Telephone Encounter (Signed)
Message routed to Kirtland Bouchard, [place]

## 2014-11-09 NOTE — Telephone Encounter (Signed)
Dr. Charlett Blake,  Re: [name] Enteral Feedings  I just off phone with Pryor Ochoa, the Dietician @ [facility]  I was going to mail her the two RX's you gave me at [name]'s visit on 11/06/2013, but she told the Pharmacy needs to have FAXED RX's!  So could someone on your staff please FAX the RX for the Jevity 1.5 formula and the MIC gastrostomy/bolus feeding tube 24 [place]?  The Direct FAX # is [phone]  Thank you for everything!  Marya Fossa   Order given by Dr. Charlett Blake to fax Rx's.  Rx's printed and placed in Dr. Charlett Blake red folder for review and signature.

## 2014-11-11 ENCOUNTER — Encounter: Payer: Self-pay | Admitting: Family Medicine

## 2014-11-11 NOTE — Assessment & Plan Note (Signed)
Is being catheterized routinely given prescription for supplies

## 2014-11-11 NOTE — Assessment & Plan Note (Signed)
Using Jevity via PEG tube feeds, 24 french gastronomy tube.

## 2014-11-11 NOTE — Assessment & Plan Note (Signed)
No longer taking food by mouth due to worsening dysphagia.

## 2014-11-11 NOTE — Assessment & Plan Note (Signed)
Calmer today than at last couple of visits. Is now on low dose of Paroxetine and is going to stay on it for now. Will follow up with Neurology

## 2014-11-11 NOTE — Progress Notes (Signed)
[name]  [id number] 04/13/1986 11/11/2014      Progress Note-Follow Up  Subjective  Chief Complaint  Chief Complaint  Patient presents with  . Follow-up    HPI  Patient is a 29 y.o. male in today for routine medical care. He is in today with his mother. They are in need of incontinence supplies and PEG tube feeding supplies. He is feeling better. He is much calmer today than his last few visits. They report his congestion is better. No cough or fever. Denies CP/palp/SOB/HA/congestion/fevers/GI or GU c/o. Taking meds as prescribed  Past Medical History  Diagnosis Date  . Cerebral palsy   . GERD (gastroesophageal reflux disease)   . Hyperthyroidism   . Incontinence of feces   . Palpitations   . Esophagitis 2011  . Dehydration 11/22/2013  . Depression with anxiety 08/01/2010    Qualifier: Diagnosis of  By: Nelson-Smith CMA (AAMA), Dottie    . Loss of weight 08/28/2014  . Depression with anxiety 08/01/2010    Qualifier: Diagnosis of  By: Nelson-Smith CMA (AAMA), Dottie      Past Surgical History  Procedure Laterality Date  . Spine surgery  ,11/20/2010, 2011    for correction of severe contracturing spinal kyphosis.   . Eye surgery    . Ears tubes    . Hamstring released      to treat contractures.   . Baclofen trial    . Baslofen pump implant    . Spinal fusion    . G-tube insert  August 2006  . Spinal fusioncorrect 106 degree kyphosis    . Spinal fusion to correct 70 degree kyphosis  11-2010  . Tonsillectomy    . Peg placement  10/21/2011    Procedure: PERCUTANEOUS ENDOSCOPIC GASTROSTOMY (PEG) REPLACEMENT;  Surgeon: [name], MD;  Location: [facility] END[facility]COPY;  Service: Endoscopy;  Laterality: N/A;  . Peg placement N/A 06/13/2013    Procedure: PERCUTANEOUS ENDOSCOPIC GASTROSTOMY (PEG) REPLACEMENT;  Surgeon: [name], MD;  Location: [facility];  Service: Endoscopy;  Laterality: N/A;  . Hip surgery      x2 , side   . Flexible sigmoidoscopy N/A 09/07/2014   Procedure: FLEXIBLE SIGMOIDOSCOPY;  Surgeon: [name], MD;  Location: [facility] ENDOSCOPY;  Service: Endoscopy;  Laterality: N/A;    Family History  Problem Relation Age of Onset  . Asthma Mother   . Hyperlipidemia Mother   . COPD Mother   . Other Mother     bronchial stasis/ABPA  . Cancer Maternal Grandmother 69    breast  . Hyperlipidemia Maternal Grandmother   . Hypertension Maternal Grandmother   . Cancer Maternal Grandfather     prostate  . Heart disease Paternal Grandfather     CHF  . Osteoporosis Paternal Grandmother   . Arthritis Paternal Grandmother     rheumatoid    History   Social History  . Marital Status: Single    Spouse Name: N/A    Number of Children: 0  . Years of Education: N/A   Occupational History  . disbaled    Social History Main Topics  . Smoking status: Never Smoker   . Smokeless tobacco: Never Used  . Alcohol Use: No  . Drug Use: No  . Sexual Activity: No   Other Topics Concern  . Not on file   Social History Narrative    Current Outpatient Prescriptions on File Prior to Visit  Medication Sig Dispense Refill  . bacitracin 500 UNIT/GM ointment Apply  1 application topically 2 (two) times daily. 30 g 1  . clotrimazole-betamethasone (LOTRISONE) cream Apply 1 application topically 2 (two) times daily. 45 g 1  . dantrolene (DANTRIUM) 50 MG capsule Take 1 capsule (50 mg total) by mouth 2 (two) times daily. 60 capsule 3  . diazepam (VALIUM) 2 MG tablet Take 1-2 tablets (2-4 mg total) by mouth at bedtime. 60 tablet 2  . divalproex (DEPAKOTE SPRINKLE) 125 MG capsule Take 2 capsules in morning, 5 capsules at bedtime (Patient taking differently: 250-625 mg. Take 2 capsules in morning, 5 capsules at bedtime) 210 capsule 4  . Feeding Tubes - Bags (FLEXIFLO FEEDING BAG/PUMP) MISC 1 Units by Does not apply route continuous. 1 each 30  . Feeding Tubes - Pump MISC 1 Units by Does not apply route continuous. 1 each 0  . Feeding Tubes - Sets (KANGAROO EPUMP  SET 1000ML) MISC 30 day supply of Kangaroo Joey PUmp set with flush bag 1 each 5  . Incontinence Supplies (BARD LEG BAG STRAPS/FABRIC) MISC Bard Dispoz-a-Bag leg bag w/flip flo Valve, sterile, w/Gabric strap, 18" extension tubing 19 oz  Item #[id number] 2 each 6  . Incontinence Supply Disposable (PREVAIL BREEZERS MEDIUM) MISC pkg of 16- size medium 32" to 44"  Breathable cloth-like outer fabric (can't use the plastic outer surgace  Item # [id number]/[id number] 16 each 6  . Lactobacillus (PROBIOTIC ACIDOPHILUS PO) Place 30 mLs into feeding tube daily.    Marland Kitchen LORazepam (ATIVAN) 1 MG tablet Take 1 tab po tid prn for anxiety 90 tablet 1  . metoprolol tartrate (LOPRESSOR) 25 MG tablet Take 25 mg by mouth 2 (two) times daily as needed. Palpitaitons, tachycardia    . NON FORMULARY Bard Leg Bag Extension tubing w/Connector 18", Sterile, latex-free  Item# [id number]    . NON FORMULARY Colorplast Freedom Cath Latex Self-Adhering Male External Catheter 35mm Diameter Intermediate  Item# [id number]    . nystatin cream (MYCOSTATIN) Apply 1 application topically 2 (two) times daily as needed for dry skin. 30 g 1  . OLANZapine (ZYPREXA) 5 MG tablet Take 5 mg by mouth at bedtime.    Earney Navy Bicarbonate (ZEGERID) 20-1100 MG CAPS capsule Take 1 capsule by mouth daily before breakfast.    . Ostomy Supplies (PROTECTIVE BARRIER WIPES) MISC 1-1/4" X 3"  Item #[id number] 75 each 6  . oxybutynin (DITROPAN) 5 MG/5ML syrup Take 5 mg by mouth 2 (two) times daily.     Marland Kitchen PRESCRIPTION MEDICATION G-tube    . PRESCRIPTION MEDICATION Colorplast Freedom Cath Latex Self-Adhering Male External Catheter 35mm Diameter Intermediate  Item# [id number]    . sucralfate (CARAFATE) 1 G tablet 1 tablet twice daily via PEG 60 tablet 1   No current facility-administered medications on file prior to visit.    Allergies  Allergen Reactions  . Ambien [Zolpidem Tartrate] Nausea Only  . Codeine   . Baclofen Anxiety  . Sulfonamide Derivatives Rash      Review of Systems  Review of Systems  Constitutional: Negative for fever and malaise/fatigue.  HENT: Negative for congestion.   Eyes: Negative for discharge.  Respiratory: Positive for cough. Negative for shortness of breath.   Cardiovascular: Negative for chest pain, palpitations and leg swelling.  Gastrointestinal: Negative for nausea, abdominal pain and diarrhea.  Genitourinary: Negative for dysuria.  Musculoskeletal: Negative for falls.  Skin: Negative for rash.  Neurological: Negative for loss of consciousness and headaches.  Endo/Heme/Allergies: Negative for polydipsia.  Psychiatric/Behavioral: Negative for depression and suicidal ideas. The patient is nervous/anxious.  The patient does not have insomnia.     Objective  BP 110/75 mmHg  Pulse 90  Temp(Src) 97.8 F (36.6 C) (Axillary)  Ht   Wt 99 lb (44.906 kg)  SpO2 99%  Physical Exam  Physical Exam  Constitutional: He is oriented to person, place, and time and well-developed, well-nourished, and in no distress. No distress.  HENT:  Head: Normocephalic and atraumatic.  Eyes: Conjunctivae are normal.  Neck: Neck supple. No thyromegaly present.  Cardiovascular: Normal rate, regular rhythm and normal heart sounds.   No murmur heard. Pulmonary/Chest: Effort normal and breath sounds normal. No respiratory distress.  Abdominal: He exhibits no distension and no mass. There is no tenderness.  LUQ gastrostomy tube in place. No surrounding erythema or fluctuance  Musculoskeletal: He exhibits no edema.  Neurological: He is alert and oriented to person, place, and time.  Skin: Skin is warm.  Psychiatric: Memory, affect and judgment normal.    Lab Results  Component Value Date   TSH 0.774 06/08/2014   Lab Results  Component Value Date   WBC 7.0 10/16/2014   HGB 15.5 10/16/2014   HCT 47.0 10/16/2014   MCV 89.7 10/16/2014   PLT 252.0 10/16/2014   Lab Results  Component Value Date   CREATININE 0.4 10/16/2014    BUN 10 10/16/2014   NA 139 10/16/2014   K 4.0 10/16/2014   CL 104 10/16/2014   CO2 28 10/16/2014   Lab Results  Component Value Date   ALT 23 10/16/2014   AST 25 10/16/2014   ALKPHOS 84 10/16/2014   BILITOT 0.4 10/16/2014   Lab Results  Component Value Date   CHOL 151 04/25/2010   Lab Results  Component Value Date   HDL 29.90* 04/25/2010   Lab Results  Component Value Date   LDLCALC 96 04/25/2010   Lab Results  Component Value Date   TRIG 125.0 04/25/2010   Lab Results  Component Value Date   CHOLHDL 5 04/25/2010     Assessment & Plan  Dysphagia No longer taking food by mouth due to worsening dysphagia.   Urinary incontinence Is being catheterized routinely given prescription for supplies  Protein-calorie malnutrition, severe Using Jevity via PEG tube feeds, 24 french gastronomy tube.   Depression with anxiety Calmer today than at last couple of visits. Is now on low dose of Paroxetine and is going to stay on it for now. Will follow up with Neurology

## 2014-11-12 ENCOUNTER — Ambulatory Visit: Payer: Medicare Other | Admitting: Pulmonary Disease

## 2014-11-12 MED ORDER — JEVITY 1.5 CAL/FIBER PO LIQD: ORAL | Status: DC

## 2014-11-12 NOTE — Telephone Encounter (Signed)
Rx's printed signed and faxed to the pharmacy.//AB/CMA

## 2014-11-13 ENCOUNTER — Ambulatory Visit (INDEPENDENT_AMBULATORY_CARE_PROVIDER_SITE_OTHER): Payer: Medicare Other | Admitting: Internal Medicine

## 2014-11-13 ENCOUNTER — Encounter: Payer: Self-pay | Admitting: Internal Medicine

## 2014-11-13 VITALS — BP 105/60 | HR 78 | Ht 60.0 in | Wt 96.0 lb

## 2014-11-13 DIAGNOSIS — R195 Other fecal abnormalities: Secondary | ICD-10-CM

## 2014-11-13 DIAGNOSIS — Z931 Gastrostomy status: Secondary | ICD-10-CM | POA: Diagnosis not present

## 2014-11-13 DIAGNOSIS — G809 Cerebral palsy, unspecified: Secondary | ICD-10-CM

## 2014-11-13 NOTE — Patient Instructions (Addendum)
Please Follow up in 1 year.  Cc. Willette Alma

## 2014-11-13 NOTE — Progress Notes (Signed)
Subjective:    Patient ID: [name], male    DOB: 10-Mar-1986, 29 y.o.   MRN: [id number]  HPI [name] is a 29 year old male with past medical history of cerebral palsy, history of severe scoliosis/kyphosis status post spinal fusions which has led to worsening dysphagia and now dependence on tube feedings. He is also had history of aspiration pneumonia and was seen in the hospital to evaluate rectal bleeding in November 2015. He had a flexible sigmoidoscopy on 09/07/2014 which revealed normal sigmoid, rectosigmoid and rectum. It was presumed that large bowel movements and constipation relating to anorectal bleeding. He is managed by his mother and home health care. He is using tube feeds continuously though very rarely eats some food for comfort by mouth such as yogurt. Overall he is been doing well. He has gained about 6 pounds since resuming tube feeds full time. They are switching tube feeding formula to Jevity 1.5 because he has been having loose stools. His loose stools are only occurring every 2-3 days but can be quite loose. Stools are nonbloody with no evidence of rectal bleeding or melena. [name]'s mom reports he has has been feeling well without complaints lately. PEG tube is working well and she is changing this about every 3 months at home. It is a 17 French PEG tube with balloon. No recent pneumonias.   Review of Systems As per history of present illness, otherwise negative  Current Medications, Allergies, Past Medical History, Past Surgical History, Family History and Social History were reviewed in [employer] record.     Objective:   Physical Exam BP 105/60 mmHg  Pulse 78  Ht 5' (1.524 m)  Wt 96 lb (43.545 kg)  BMI 18.75 kg/m2  SpO2 98% Constitutional: Well-developed and well-nourished. No distress. HEENT: Normocephalic and atraumatic. Oropharynx is clear and moist. No oropharyngeal exudate. Conjunctivae are normal.  No scleral icterus. Neck: Neck  supple. Trachea midline. Cardiovascular: Normal rate, regular rhythm and intact distal pulses. No M/R/G Pulmonary/chest: Effort normal and breath sounds normal. No wheezing, rales or rhonchi. Abdominal: Soft, nontender, nondistended. Bowel sounds active throughout. PEG tube in place in the left middle abdomen without surrounding erythema or induration. Extremities:contractures in the upper extremities contractures in the upper extremities, no lower extremity edem Neurological: Alert, makes eye contacts can communicate by grunting , follows commands  Skin: Skin is warm and dry. No rashes noted. Psychiatric: Normal mood and affect. Behavior is normal.  CBC    Component Value Date/Time   WBC 7.0 10/16/2014 1838   RBC 5.24 10/16/2014 1838   HGB 15.5 10/16/2014 1838   HCT 47.0 10/16/2014 1838   PLT 252.0 10/16/2014 1838   MCV 89.7 10/16/2014 1838   MCH 29.7 09/18/2014 1123   MCHC 33.0 10/16/2014 1838   RDW 13.6 10/16/2014 1838   LYMPHSABS 1.7 09/18/2014 1123   MONOABS 0.4 09/18/2014 1123   EOSABS 0.2 09/18/2014 1123   BASOSABS 0.0 09/18/2014 1123    CMP     Component Value Date/Time   NA 139 10/16/2014 1838   K 4.0 10/16/2014 1838   CL 104 10/16/2014 1838   CO2 28 10/16/2014 1838   GLUCOSE 85 10/16/2014 1838   BUN 10 10/16/2014 1838   CREATININE 0.4 10/16/2014 1838   CALCIUM 9.5 10/16/2014 1838   PROT 8.5* 10/16/2014 1838   ALBUMIN 4.5 10/16/2014 1838   ALBUMIN 4.5 10/16/2014 1838   AST 25 10/16/2014 1838   ALT 23 10/16/2014 1838   ALKPHOS  84 10/16/2014 1838   BILITOT 0.4 10/16/2014 1838   GFRNONAA >90 09/18/2014 1123   GFRAA >90 09/18/2014 1123   CLINICAL DATA:  Intermittent rectal bleeding for 3 days. History of cerebral palsy.   EXAM: CT ABDOMEN AND PELVIS WITH CONTRAST   TECHNIQUE: Multidetector CT imaging of the abdomen and pelvis was performed using the standard protocol following bolus administration of intravenous contrast.   CONTRAST:  175mL OMNIPAQUE  IOHEXOL 300 MG/ML  SOLN   COMPARISON:  11/23/2013   FINDINGS: Right lower lobe consolidation is partially visualized, as is a small right pleural effusion. 3 mm focus of calcification versus aspirated contrast in the right lower lobe is unchanged.   Evaluation of the abdomen is mildly limited by streak artifact from posterior spinal fusion hardware. Allowing for this, the liver, gallbladder, spleen, adrenal glands, right kidney, and pancreas have a grossly unremarkable appearance. 6 mm hypodensity in the lower pole of the left kidney is grossly unchanged and most likely represents a cyst.   A percutaneous gastrostomy tube is in place. Oral contrast is present in the loops of nondilated small and large bowel without evidence of obstruction. Rectal wall thickening is questioned. The appendix is identified in the right lower quadrant and is unremarkable. No free fluid or enlarged lymph nodes are identified.   Bladder is more distended than on the prior study without evidence of wall thickening. Prostate is upper limits of normal in size. Coarse calcifications in the right lower abdominal wall are unchanged. Moderate thoracolumbar dextroscoliosis is again seen with prior posterior thoracolumbar fusion noted.   IMPRESSION: 1. Small right pleural effusion and right lower lobe consolidation, incompletely visualized but suspicious for pneumonia. 2. Question rectal wall thickening. Otherwise no evidence of acute abnormality or mass in the abdomen or pelvis.     Electronically Signed   By: Logan Bores   On: 09/06/2014 10:05       Assessment & Plan:   29 year old male with past medical history of cerebral palsy, history of severe scoliosis/kyphosis status post spinal fusions which has led to worsening dysphagia and now dependence on tube feedings.  1. CP with dysphagia worsening after spinal surgery to correct kyphosis -- tube feeds are going well and he has been able to gain  weight. No recent hospitalizations for pneumonia. Ideally he would have nothing by mouth to reduce risk of aspiration. Recent change in tube feed to at fiber in attempt to help bulk stools and prevent loose stools associated with tube feeds.   2. Rectal bleeding -- felt secondary to large and hard bowel movements before initiation of tube feeds. This has resolved.no further rectal bleeding   3.  PEG -- mother cares for PEG at home. Mother reports PEG tube site seems to become sore and bothersome if not changed every 3 months. Will plan to change PEG tube every 3 months at home with 24 French replacement balloon PEG tube. Jevity 1.5 tube feeding formula as ordered by primary care   Follow-up in one year, sooner if necessary

## 2014-11-14 ENCOUNTER — Telehealth: Payer: Self-pay | Admitting: *Deleted

## 2014-11-14 DIAGNOSIS — G800 Spastic quadriplegic cerebral palsy: Secondary | ICD-10-CM | POA: Diagnosis not present

## 2014-11-14 DIAGNOSIS — R32 Unspecified urinary incontinence: Secondary | ICD-10-CM | POA: Diagnosis not present

## 2014-11-14 DIAGNOSIS — F0390 Unspecified dementia without behavioral disturbance: Secondary | ICD-10-CM | POA: Diagnosis not present

## 2014-11-14 DIAGNOSIS — R1312 Dysphagia, oropharyngeal phase: Secondary | ICD-10-CM | POA: Diagnosis not present

## 2014-11-14 DIAGNOSIS — E039 Hypothyroidism, unspecified: Secondary | ICD-10-CM | POA: Diagnosis not present

## 2014-11-14 DIAGNOSIS — F341 Dysthymic disorder: Secondary | ICD-10-CM | POA: Diagnosis not present

## 2014-11-14 NOTE — Telephone Encounter (Signed)
Per Dr Hilarie Fredrickson, patient needs #3 PEG tubes per year. Unfortunately, medicare will only cover 2 tubes per year. Per patient's mother, she can get a tube off of [employer] that she will pay out of pocket for as long as we provide a prescription. She states that an rx is not needed at this time because Dr Charlett Blake just provided one. However, she will let us know when she does need script.

## 2014-11-15 NOTE — Telephone Encounter (Signed)
Still has not heard anything back on peg tube refills, please Wilburn Mylar @ [phone]

## 2014-11-20 ENCOUNTER — Telehealth: Payer: Self-pay | Admitting: Family Medicine

## 2014-11-20 ENCOUNTER — Encounter: Payer: Medicare Other | Attending: Physical Medicine & Rehabilitation | Admitting: Physical Medicine & Rehabilitation

## 2014-11-20 ENCOUNTER — Encounter: Payer: Self-pay | Admitting: Physical Medicine & Rehabilitation

## 2014-11-20 VITALS — BP 114/61 | HR 90 | Resp 16

## 2014-11-20 DIAGNOSIS — Z931 Gastrostomy status: Secondary | ICD-10-CM | POA: Insufficient documentation

## 2014-11-20 DIAGNOSIS — R634 Abnormal weight loss: Secondary | ICD-10-CM

## 2014-11-20 DIAGNOSIS — G8929 Other chronic pain: Secondary | ICD-10-CM | POA: Diagnosis not present

## 2014-11-20 DIAGNOSIS — G809 Cerebral palsy, unspecified: Secondary | ICD-10-CM

## 2014-11-20 DIAGNOSIS — R1312 Dysphagia, oropharyngeal phase: Secondary | ICD-10-CM | POA: Insufficient documentation

## 2014-11-20 DIAGNOSIS — G243 Spasmodic torticollis: Secondary | ICD-10-CM | POA: Diagnosis not present

## 2014-11-20 DIAGNOSIS — R1314 Dysphagia, pharyngoesophageal phase: Secondary | ICD-10-CM

## 2014-11-20 DIAGNOSIS — G8250 Quadriplegia, unspecified: Secondary | ICD-10-CM | POA: Diagnosis not present

## 2014-11-20 DIAGNOSIS — M549 Dorsalgia, unspecified: Secondary | ICD-10-CM | POA: Diagnosis not present

## 2014-11-20 NOTE — Patient Instructions (Signed)
LOW BP:  THIGH HIGH TED STOCKINGS ABDOMINAL BINDER ?REDUCE DAY TIME DANTRIUM TO 25MG  OR OFF?

## 2014-11-20 NOTE — Progress Notes (Signed)
Subjective:    Patient ID: [name], male    DOB: 05-31-1986, 29 y.o.   MRN: [id number]  HPI   [name] is back regarding his CP. He has had some struggles with his nutrition and dysphagia. He has had to go back to a lot of TF due to aspiration. His BP's have been low at times, usually associated with poor intake and infections.  CT scans have not shown any obvious acute changes. He has been out of the hopsital for a while fortunately. His mother/caregivers take diligent care of him at home. He is unable to cough up secretions and requires suction and regular oral care..        Pain Inventory Average Pain 0 Pain Right Now 0 My pain is no pain  In the last 24 hours, has pain interfered with the following? General activity 0 Relation with others 0 Enjoyment of life 0 What TIME of day is your pain at its worst? no pain Sleep (in general) no pain  Pain is worse with: no pain Pain improves with: no pain Relief from Meds: no pain  Mobility use a wheelchair needs help with transfers  Function disabled: date disabled . I need assistance with the following:  feeding, dressing, bathing, toileting, meal prep, household duties and shopping  Neuro/Psych No problems in this area  Prior Studies Any changes since last visit?  no  Physicians involved in your care Any changes since last visit?  no   Family History  Problem Relation Age of Onset  . Asthma Mother   . Hyperlipidemia Mother   . COPD Mother   . Other Mother     bronchial stasis/ABPA  . Cancer Maternal Grandmother 21    breast  . Hyperlipidemia Maternal Grandmother   . Hypertension Maternal Grandmother   . Cancer Maternal Grandfather     prostate  . Heart disease Paternal Grandfather     CHF  . Osteoporosis Paternal Grandmother   . Arthritis Paternal Grandmother     rheumatoid   History   Social History  . Marital Status: Single    Spouse Name: N/A    Number of Children: 0  . Years of  Education: N/A   Occupational History  . disbaled    Social History Main Topics  . Smoking status: Never Smoker   . Smokeless tobacco: Never Used  . Alcohol Use: No  . Drug Use: No  . Sexual Activity: No   Other Topics Concern  . None   Social History Narrative   Past Surgical History  Procedure Laterality Date  . Spine surgery  ,11/20/2010, 2011    for correction of severe contracturing spinal kyphosis.   . Eye surgery    . Ears tubes    . Hamstring released      to treat contractures.   . Baclofen trial    . Baslofen pump implant    . Spinal fusion    . G-tube insert  August 2006  . Spinal fusioncorrect 106 degree kyphosis    . Spinal fusion to correct 70 degree kyphosis  11-2010  . Tonsillectomy    . Peg placement  10/21/2011    Procedure: PERCUTANEOUS ENDOSCOPIC GASTROSTOMY (PEG) REPLACEMENT;  Surgeon: [name], MD;  Location: [facility];  Service: Endoscopy;  Laterality: N/A;  . Peg placement N/A 06/13/2013    Procedure: PERCUTANEOUS ENDOSCOPIC GASTROSTOMY (PEG) REPLACEMENT;  Surgeon: [name], MD;  Location: [facility];  Service: Endoscopy;  Laterality: N/A;  .  Hip surgery      x2 , side   . Flexible sigmoidoscopy N/A 09/07/2014    Procedure: FLEXIBLE SIGMOIDOSCOPY;  Surgeon: [name], MD;  Location: [facility] ENDOSCOPY;  Service: Endoscopy;  Laterality: N/A;   Past Medical History  Diagnosis Date  . Cerebral palsy   . GERD (gastroesophageal reflux disease)   . Hyperthyroidism   . Incontinence of feces   . Palpitations   . Esophagitis 2011  . Dehydration 11/22/2013  . Depression with anxiety 08/01/2010    Qualifier: Diagnosis of  By: Nelson-Smith CMA (AAMA), Dottie    . Loss of weight 08/28/2014  . Depression with anxiety 08/01/2010    Qualifier: Diagnosis of  By: Nelson-Smith CMA (AAMA), Dottie     BP 114/61 mmHg  Pulse 90  Resp 16  SpO2 97%  Opioid Risk Score:   Fall Risk Score: Low Fall Risk (0-5 points) Review of Systems  All other systems  reviewed and are negative.      Objective:   Physical Exam  General: Alert and oriented x 3, No apparent distress. He is sitting in a motorized chair with head rest and trunk supports. He appears to be in good spirits.  HEENT: Head is normocephalic, atraumatic, PERRLA, EOMI, sclera anicteric, oral mucosa pink and dry--occasional plaques white, dentition intact, ext ear canals clear,  Neck: Supple without JVD or lymphadenopathy  Heart: Reg rate and rhythm. No murmurs rubs or gallops  Chest: CTA bilaterally without wheezes, rales, or rhonchi; no distress  Abdomen: Soft, non-tender, non-distended, bowel sounds positive.  Extremities: No clubbing, cyanosis, or edema. Pulses are 2+  Skin: Clean and intact  Neuro:He worsened oralmotor control. He appears much more alert however. His eye contact his better. Posture is better. He holds his head more in a neutral position although it remains rotated and bent to the left.. Tone in the UE is 3-4/4 pec major/minor, 3/4 bicep and wrist/HI--p He had fluctuating tone in his hamstrings, quads 2/4, HAD's at 0/4. When supine he was able to lay surprisingly flat. Heel cords were tight as well at 3/4. Pt with minimal volitional movement on exam. He can sense pain in the trunk and extremities  Musculoskeletal: low back, trunk and cervical ROM is limited. He has kyphosis. . Both traps and SCM's are improved from baseline. Head posture much improved. His hip adductors are tight 2-3/4.  Psych: Pt's affect is appropriate. Pt is cooperative given his cognitive linguistic issues. Does understand basic commands and answers questions although his speech is difficult to understand.   Assessment & Plan:  1. CP with spastic tetraplegia  2. 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 Severe oro-pharyngeal dysphagia with G-tube. Likely related to general course/progression of his cerebral  palsy.  Plan:  1. Consider further botox to LE's----mother will call me   2. Maintain dantrium but if BP continues low can reduce AM dose to 25mg  or stop completely 3. Reviewed fluids, nutrition. Consider abdominal binder and THT's as well for low bp  4. Follow up in about 6 months. 25 minutes of face to face patient care time were spent during this visit. All questions were encouraged and answered.

## 2014-11-20 NOTE — Telephone Encounter (Signed)
Rx's was faxed to the pharmacy on (11/12/14).  Confirmation received.//AB/CMA

## 2014-11-20 NOTE — Telephone Encounter (Signed)
Caller name: Pryor Ochoa Relation to pt:The Dietician  Reason for call:  the Dietician @ [facility] never received RX for Jevity 1.5 formula and the MIC gastrostomy/bolus. Direct fax # [phone]

## 2014-11-21 DIAGNOSIS — F341 Dysthymic disorder: Secondary | ICD-10-CM | POA: Diagnosis not present

## 2014-11-21 DIAGNOSIS — R32 Unspecified urinary incontinence: Secondary | ICD-10-CM | POA: Diagnosis not present

## 2014-11-21 DIAGNOSIS — R1312 Dysphagia, oropharyngeal phase: Secondary | ICD-10-CM | POA: Diagnosis not present

## 2014-11-21 DIAGNOSIS — F0390 Unspecified dementia without behavioral disturbance: Secondary | ICD-10-CM | POA: Diagnosis not present

## 2014-11-21 DIAGNOSIS — E039 Hypothyroidism, unspecified: Secondary | ICD-10-CM | POA: Diagnosis not present

## 2014-11-21 DIAGNOSIS — G800 Spastic quadriplegic cerebral palsy: Secondary | ICD-10-CM | POA: Diagnosis not present

## 2014-11-21 MED ORDER — JEVITY 1.5 CAL PO LIQD: ORAL | Status: DC

## 2014-11-21 NOTE — Telephone Encounter (Signed)
Jevity Rx reprinted and faxed to number listed.    eal

## 2014-12-03 ENCOUNTER — Other Ambulatory Visit: Payer: Self-pay | Admitting: *Deleted

## 2014-12-03 ENCOUNTER — Telehealth: Payer: Self-pay

## 2014-12-03 MED ORDER — DIAZEPAM 2 MG PO TABS: 2.0000 mg | ORAL_TABLET | Freq: Every day | ORAL | Status: DC

## 2014-12-03 NOTE — Telephone Encounter (Signed)
Notification received regarding discharge received. Form reviewed and signed by Dr. Charlett Blake.  Form faxed back to [place] at [phone].  Fax confirmation received. Form placed in scan basket for scanning.

## 2014-12-03 NOTE — Telephone Encounter (Signed)
Diazepam refilled per fax request from pharmacy.  Called to pharmacy

## 2014-12-04 ENCOUNTER — Ambulatory Visit (HOSPITAL_BASED_OUTPATIENT_CLINIC_OR_DEPARTMENT_OTHER)
Admission: RE | Admit: 2014-12-04 | Discharge: 2014-12-04 | Disposition: A | Discharge status: Home / Self Care | Payer: Medicare Other | Source: Ambulatory Visit | Attending: Internal Medicine | Admitting: Internal Medicine

## 2014-12-04 ENCOUNTER — Telehealth: Payer: Self-pay | Admitting: Internal Medicine

## 2014-12-04 DIAGNOSIS — R112 Nausea with vomiting, unspecified: Secondary | ICD-10-CM | POA: Insufficient documentation

## 2014-12-04 DIAGNOSIS — J189 Pneumonia, unspecified organism: Secondary | ICD-10-CM | POA: Diagnosis not present

## 2014-12-04 DIAGNOSIS — J90 Pleural effusion, not elsewhere classified: Secondary | ICD-10-CM | POA: Diagnosis not present

## 2014-12-04 DIAGNOSIS — R05 Cough: Secondary | ICD-10-CM

## 2014-12-04 DIAGNOSIS — R059 Cough, unspecified: Secondary | ICD-10-CM

## 2014-12-04 DIAGNOSIS — R1111 Vomiting without nausea: Secondary | ICD-10-CM

## 2014-12-04 DIAGNOSIS — R634 Abnormal weight loss: Secondary | ICD-10-CM | POA: Insufficient documentation

## 2014-12-04 NOTE — Telephone Encounter (Signed)
Spoke with pts mother and she is aware. Orders entered for xrays, they would like to go to [facility].

## 2014-12-04 NOTE — Telephone Encounter (Signed)
Would recommend 2 view chest and abdomen x-ray, rule out aspiration, rule out ileus If x-ray negative for ileus then could consider adding Reglan 5 mg before tube feeds and at bedtime Obviously, needs to remain upright after tube feeds for at least an hour Notify Dr. Elsworth Soho regarding possible aspiration

## 2014-12-04 NOTE — Telephone Encounter (Signed)
Mother states that for the past 5 days she has not been able to run his tube feeding at night on the pump. States she cannot give him anything via his tube when he is lying down without him throwing up. Not able to get 5 cans in him a day, only about 3 a day. Also reports he is coughing a lot and she isn't sure if he might has aspirated some. Please advise.

## 2014-12-05 ENCOUNTER — Other Ambulatory Visit: Payer: Self-pay

## 2014-12-05 MED ORDER — AMOXICILLIN-POT CLAVULANATE 600-42.9 MG/5ML PO SUSR: 875.0000 mg | Freq: Two times a day (BID) | ORAL | Status: DC

## 2014-12-11 ENCOUNTER — Encounter: Payer: Self-pay | Admitting: Physician Assistant

## 2014-12-11 ENCOUNTER — Ambulatory Visit (INDEPENDENT_AMBULATORY_CARE_PROVIDER_SITE_OTHER): Payer: Medicare Other | Admitting: Physician Assistant

## 2014-12-11 VITALS — BP 117/96 | HR 80 | Temp 98.0°F | Resp 18 | Wt 94.4 lb

## 2014-12-11 DIAGNOSIS — J189 Pneumonia, unspecified organism: Secondary | ICD-10-CM

## 2014-12-11 DIAGNOSIS — K219 Gastro-esophageal reflux disease without esophagitis: Secondary | ICD-10-CM

## 2014-12-11 DIAGNOSIS — IMO0001 Reserved for inherently not codable concepts without codable children: Secondary | ICD-10-CM

## 2014-12-11 NOTE — Patient Instructions (Signed)
Please finish Augmentin, taking as directed for a total of 10 days.  Continue reflux medication.  Put a humidifier in the bedroom. Continue other medications as directed. Follow-up with Dr. Hilarie Fredrickson as scheduled.  Please see Dr. Elsworth Soho if anything is recurring.

## 2014-12-11 NOTE — Progress Notes (Signed)
Pre visit review using our clinic review tool, if applicable. No additional management support is needed unless otherwise documented below in the visit note/SLS  

## 2014-12-12 ENCOUNTER — Telehealth: Payer: Self-pay | Admitting: *Deleted

## 2014-12-12 ENCOUNTER — Telehealth: Payer: Self-pay | Admitting: Physician Assistant

## 2014-12-12 NOTE — Telephone Encounter (Signed)
-----   Message from [name], MD sent at 12/12/2014 12:37 PM EST ----- Einar Pheasant,  Reglan may be beneficial, but given the possible neurologic side-effects I would 1st want to clear with his neurologist  Barbera Setters or Dottie I would like [name]'s neurologist's opinion regarding reglan as an option to use chronically in attempt to speed gastric emptying and try to help prevent reflux/aspiration  Thanks  Zenovia Jarred   ----- Message -----    From: Brunetta Jeans, PA-C    Sent: 12/11/2014   3:07 PM      To: [name], MD  Hi Dr. Hilarie Fredrickson,  I saw patient above and his mother today for a follow-up of RLL pneumonia, thought to be from aspiration giving tube feedings and reflux.   Patient was clinically improved on exam.  Lungs sounded great.  No fever, tachycardia or tachypnea.  Discussed possibility of change in tube feeding formula as this reflux has worsened since they started new formula.   I saw where you had mentioned adding Reglan to his reflux regimen, but wanted to defer this to you as patient is also on Paxil and the combination can cause EPS. I thought I would defer to you on starting this medication as you have much more experience with Reglan.    Any input you can give is appreciated.  Brunetta Jeans, PA-C

## 2014-12-12 NOTE — Telephone Encounter (Signed)
Spoke with Dr. Hilarie Fredrickson regarding Reglan use for [facility].  He thinks Neurologist should be consulted due to the potential neurological side effects of medication. He is having his staff contact [name]'s Neurologist for input and will notify her once this is done.

## 2014-12-12 NOTE — Telephone Encounter (Signed)
[name], [name],  informed, understood & agreed/SLS

## 2014-12-12 NOTE — Telephone Encounter (Signed)
Dr Delice Lesch- Please advise.... Would it be acceptable to you for patient to take reglan chronically in attempt to accelerate gastric emptying and prevent aspiration and reflux?

## 2014-12-12 NOTE — Telephone Encounter (Signed)
Hi, there!  Dr. Delice Lesch is out for the week and I am covering.  I don't know this patient but I feel pretty confident that Dr. Delice Lesch isn't going to feel comfortable giving recommendations on a pt she hasn't seen in over a year.  I will make sure that Dr. Delice Lesch gets a copy of this but pt would need follow up with Dr. Delice Lesch

## 2014-12-12 NOTE — Telephone Encounter (Signed)
Per patient's mother, patient actually sees a neuropsychiatrist, Dr Agapito Games more frequently and he is acutally the one that prescribes all of Vaun's medication. I will forward note to Dr Tamera Punt for his opinion.

## 2014-12-17 DIAGNOSIS — J189 Pneumonia, unspecified organism: Secondary | ICD-10-CM | POA: Insufficient documentation

## 2014-12-17 DIAGNOSIS — K219 Gastro-esophageal reflux disease without esophagitis: Secondary | ICD-10-CM | POA: Insufficient documentation

## 2014-12-17 DIAGNOSIS — J690 Pneumonitis due to inhalation of food and vomit: Secondary | ICD-10-CM | POA: Insufficient documentation

## 2014-12-17 NOTE — Progress Notes (Signed)
Patient presents to clinic today with mother and his brother for a follow-up of CAP pneumonia, possibly aspiration pneumonia.  Recent CXR on 12/04/14 ordered by his gastroenterologist revealed RLL pneumonia.  Was given prescription for Augmentin suspension as patient cannot tolerate PO medications. Mother endorses giving medication as instructed through his feeding tube.  Mother denies fever.  Patient denies chest pain or shortness of breath.  Mother notes patient has seemed more interactive the past few days.  Mother has noticed increased in "gurgling" since they have started a new tube feeding formula. Patient with impaired swallowing of all liquid consistencies.  Is not currently getting anything PO.  Is on Zegerid daily for reflux.  GI had mentioned starting Reglan since abdominal x-ray was negative, but mother states this has not been re-addressed.  Past Medical History  Diagnosis Date  . Cerebral palsy   . GERD (gastroesophageal reflux disease)   . Hyperthyroidism   . Incontinence of feces   . Palpitations   . Esophagitis 2011  . Dehydration 11/22/2013  . Depression with anxiety 08/01/2010    Qualifier: Diagnosis of  By: Nelson-Smith CMA (AAMA), Dottie    . Loss of weight 08/28/2014  . Depression with anxiety 08/01/2010    Qualifier: Diagnosis of  By: Harlon Ditty CMA (AAMA), Dottie      Current Outpatient Prescriptions on File Prior to Visit  Medication Sig Dispense Refill  . AMBULATORY NON FORMULARY MEDICATION Medication Name: MIC gastrostomy/bolus feeding tube 24 French Part number 0110-24. #2 and 10 cc lurer lock syringe #2 Dx: 4 Device 2  . amoxicillin-clavulanate (AUGMENTIN) 600-42.9 MG/5ML suspension Take 7.3 mLs (875 mg total) by mouth 2 (two) times daily. 200 mL 0  . bacitracin 500 UNIT/GM ointment Apply 1 application topically 2 (two) times daily. 30 g 1  . clotrimazole-betamethasone (LOTRISONE) cream Apply 1 application topically 2 (two) times daily. 45 g 1  . dantrolene  (DANTRIUM) 50 MG capsule Take 1 capsule (50 mg total) by mouth 2 (two) times daily. 60 capsule 3  . diazepam (VALIUM) 2 MG tablet Take 1-2 tablets (2-4 mg total) by mouth at bedtime. 60 tablet 2  . divalproex (DEPAKOTE SPRINKLE) 125 MG capsule Take 2 capsules in morning, 5 capsules at bedtime (Patient taking differently: 250-625 mg. Take 2 capsules in morning, 5 capsules at bedtime) 210 capsule 4  . Feeding Tubes - Bags (FLEXIFLO FEEDING BAG/PUMP) MISC 1 Units by Does not apply route continuous. 1 each 30  . Feeding Tubes - Pump MISC 1 Units by Does not apply route continuous. 1 each 0  . Feeding Tubes - Sets (KANGAROO EPUMP SET 1000ML) MISC 30 day supply of Kangaroo Joey PUmp set with flush bag 1 each 5  . Incontinence Supplies (BARD LEG BAG STRAPS/FABRIC) MISC Bard Dispoz-a-Bag leg bag w/flip flo Valve, sterile, w/Gabric strap, 18" extension tubing 19 oz  Item #[id number] 2 each 6  . Incontinence Supply Disposable (PREVAIL BREEZERS MEDIUM) MISC pkg of 16- size medium 32" to 44"  Breathable cloth-like outer fabric (can't use the plastic outer surgace  Item # [id number]-012/[id number] 16 each 6  . Lactobacillus (PROBIOTIC ACIDOPHILUS PO) Place 30 mLs into feeding tube daily.    Marland Kitchen LORazepam (ATIVAN) 1 MG tablet Take 1 tab po tid prn for anxiety 90 tablet 1  . metoprolol tartrate (LOPRESSOR) 25 MG tablet Take 25 mg by mouth 2 (two) times daily as needed. Palpitaitons, tachycardia    . NON FORMULARY Bard Leg Bag Extension tubing w/Connector 18", Sterile, latex-free  Item# [id number]    . NON FORMULARY Colorplast Freedom Cath Latex Self-Adhering Male External Catheter 66m Diameter Intermediate  Item# [id number]   . Nutritional Supplements (FEEDING SUPPLEMENT, JEVITY 1.5 CAL,) LIQD Run tube feed at 100 mL per hour over 12 hours. 35550 mL 11  . nystatin (MYCOSTATIN) 100000 UNIT/ML suspension     . nystatin cream (MYCOSTATIN) Apply 1 application topically 2 (two) times daily as needed for dry skin. 30 g 1  . OLANZapine  (ZYPREXA) 5 MG tablet Take 5 mg by mouth at bedtime.    .Earney NavyBicarbonate (ZEGERID) 20-1100 MG CAPS capsule Take 1 capsule by mouth daily before breakfast.    . Ostomy Supplies (PROTECTIVE BARRIER WIPES) MISC 1-1/4" X 3"  Item ##[id number] each 6  . oxybutynin (DITROPAN) 5 MG/5ML syrup Take 5 mg by mouth 2 (two) times daily.     .Marland KitchenPARoxetine (PAXIL) 10 MG tablet Take 1 tablet (10 mg total) by mouth daily.    .Marland KitchenPRESCRIPTION MEDICATION G-tube    . PRESCRIPTION MEDICATION Colorplast Freedom Cath Latex Self-Adhering Male External Catheter 327mDiameter Intermediate  Item# [id number]  . sucralfate (CARAFATE) 1 G tablet 1 tablet twice daily via PEG 60 tablet 1   No current facility-administered medications on file prior to visit.    Allergies  Allergen Reactions  . Ambien [Zolpidem Tartrate] Nausea Only  . Codeine   . Baclofen Anxiety  . Sulfonamide Derivatives Rash    Family History  Problem Relation Age of Onset  . Asthma Mother   . Hyperlipidemia Mother   . COPD Mother   . Other Mother     bronchial stasis/ABPA  . Cancer Maternal Grandmother 5258  breast  . Hyperlipidemia Maternal Grandmother   . Hypertension Maternal Grandmother   . Cancer Maternal Grandfather     prostate  . Heart disease Paternal Grandfather     CHF  . Osteoporosis Paternal Grandmother   . Arthritis Paternal Grandmother     rheumatoid    History   Social History  . Marital Status: Single    Spouse Name: N/A  . Number of Children: 0  . Years of Education: N/A   Occupational History  . disbaled    Social History Main Topics  . Smoking status: Never Smoker   . Smokeless tobacco: Never Used  . Alcohol Use: No  . Drug Use: No  . Sexual Activity: No   Other Topics Concern  . None   Social History Narrative    Review of Systems - See HPI.  All other ROS are negative.  BP 117/96 mmHg  Pulse 80  Temp(Src) 98 F (36.7 C)  Resp 18  Wt 94 lb 6 oz (42.808 kg)  SpO2  100%  Physical Exam  Constitutional: He is well-developed, well-nourished, and in no distress.  HENT:  Head: Normocephalic and atraumatic.  Eyes: Conjunctivae are normal.  Neck: Neck supple.  Cardiovascular: Normal rate, regular rhythm, normal heart sounds and intact distal pulses.   Pulmonary/Chest: Effort normal and breath sounds normal. No respiratory distress. He has no wheezes. He has no rales. He exhibits no tenderness.  Neurological: He is alert.  Skin: Skin is warm and dry. No rash noted.  Psychiatric: Affect normal.  Vitals reviewed.   Recent Results (from the past 2160 hour(s))  CBC with Differential     Status: None   Collection Time: 09/18/14 11:23 AM  Result Value Ref Range   WBC 5.9 4.0 - 10.5  K/uL   RBC 4.75 4.22 - 5.81 MIL/uL   Hemoglobin 14.1 13.0 - 17.0 g/dL   HCT 43.1 39.0 - 52.0 %   MCV 90.7 78.0 - 100.0 fL   MCH 29.7 26.0 - 34.0 pg   MCHC 32.7 30.0 - 36.0 g/dL   RDW 12.7 11.5 - 15.5 %   Platelets 255 150 - 400 K/uL   Neutrophils Relative % 63 43 - 77 %   Neutro Abs 3.7 1.7 - 7.7 K/uL   Lymphocytes Relative 28 12 - 46 %   Lymphs Abs 1.7 0.7 - 4.0 K/uL   Monocytes Relative 6 3 - 12 %   Monocytes Absolute 0.4 0.1 - 1.0 K/uL   Eosinophils Relative 3 0 - 5 %   Eosinophils Absolute 0.2 0.0 - 0.7 K/uL   Basophils Relative 0 0 - 1 %   Basophils Absolute 0.0 0.0 - 0.1 K/uL  Comprehensive metabolic panel     Status: Abnormal   Collection Time: 09/18/14 11:23 AM  Result Value Ref Range   Sodium 145 137 - 147 mEq/L   Potassium 4.2 3.7 - 5.3 mEq/L   Chloride 104 96 - 112 mEq/L   CO2 30 19 - 32 mEq/L   Glucose, Bld 90 70 - 99 mg/dL   BUN 6 6 - 23 mg/dL   Creatinine, Ser 0.40 (L) 0.50 - 1.35 mg/dL   Calcium 9.7 8.4 - 10.5 mg/dL   Total Protein 7.8 6.0 - 8.3 g/dL   Albumin 3.5 3.5 - 5.2 g/dL   AST 20 0 - 37 U/L   ALT 15 0 - 53 U/L   Alkaline Phosphatase 85 39 - 117 U/L   Total Bilirubin <0.2 (L) 0.3 - 1.2 mg/dL   GFR calc non Af Amer >90 >90 mL/min   GFR  calc Af Amer >90 >90 mL/min    Comment: (NOTE) The eGFR has been calculated using the CKD EPI equation. This calculation has not been validated in all clinical situations. eGFR's persistently <90 mL/min signify possible Chronic Kidney Disease.    Anion gap 11 5 - 15  Clostridium Difficile by PCR     Status: None   Collection Time: 09/18/14  8:05 PM  Result Value Ref Range   C difficile by pcr NEGATIVE NEGATIVE    Comment: Performed at [facility]  CBC     Status: None   Collection Time: 10/16/14  6:38 PM  Result Value Ref Range   WBC 7.0 4.0 - 10.5 K/uL   RBC 5.24 4.22 - 5.81 Mil/uL   Platelets 252.0 150.0 - 400.0 K/uL   Hemoglobin 15.5 13.0 - 17.0 g/dL   HCT 47.0 39.0 - 52.0 %   MCV 89.7 78.0 - 100.0 fl   MCHC 33.0 30.0 - 36.0 g/dL   RDW 13.6 11.5 - 15.5 %  Renal function panel     Status: None   Collection Time: 10/16/14  6:38 PM  Result Value Ref Range   Sodium 139 135 - 145 mEq/L   Potassium 4.0 3.5 - 5.1 mEq/L   Chloride 104 96 - 112 mEq/L   CO2 28 19 - 32 mEq/L   Calcium 9.5 8.4 - 10.5 mg/dL   Albumin 4.5 3.5 - 5.2 g/dL   BUN 10 6 - 23 mg/dL   Creatinine, Ser 0.4 0.4 - 1.5 mg/dL   Glucose, Bld 85 70 - 99 mg/dL   Phosphorus 3.9 2.3 - 4.6 mg/dL   GFR 259.89 >60.00 mL/min  Hepatic function  panel     Status: Abnormal   Collection Time: 10/16/14  6:38 PM  Result Value Ref Range   Total Bilirubin 0.4 0.2 - 1.2 mg/dL   Bilirubin, Direct 0.1 0.0 - 0.3 mg/dL   Alkaline Phosphatase 84 39 - 117 U/L   AST 25 0 - 37 U/L   ALT 23 0 - 53 U/L   Total Protein 8.5 (H) 6.0 - 8.3 g/dL   Albumin 4.5 3.5 - 5.2 g/dL  Valproic Acid level     Status: None   Collection Time: 11/06/14  2:10 PM  Result Value Ref Range   Valproic Acid Lvl 60.0 50.0 - 100.0 ug/mL    Assessment/Plan: Reflux Recommend tube feeding formula change due to increased reflux.  Continue Zegerid. Will defer initiating reglan to GI as patient is currently on Paxil daily and the combination of these two  medications can have significant neurological side effects.   CAP (community acquired pneumonia) Suspected aspiration.  Lung exam sounds good.  Vitals stable.  Instructed mom to have patient finish complete course of Augmentin.  Supportive measures discussed.  Follow-up in 1 week.

## 2014-12-17 NOTE — Assessment & Plan Note (Signed)
Suspected aspiration.  Lung exam sounds good.  Vitals stable.  Instructed mom to have patient finish complete course of Augmentin.  Supportive measures discussed.  Follow-up in 1 week.

## 2014-12-17 NOTE — Assessment & Plan Note (Signed)
Recommend tube feeding formula change due to increased reflux.  Continue Zegerid. Will defer initiating reglan to GI as patient is currently on Paxil daily and the combination of these two medications can have significant neurological side effects.

## 2014-12-21 IMAGING — CR DG CHEST 1V PORT
1 series · 1 of 1 positions shown · non-contrast
Comparison: 11/29/2013 and 11/17/2013

CLINICAL DATA: Shortness of breath and cough for 1 week.

EXAM:
PORTABLE CHEST - 1 VIEW

[view not recorded]
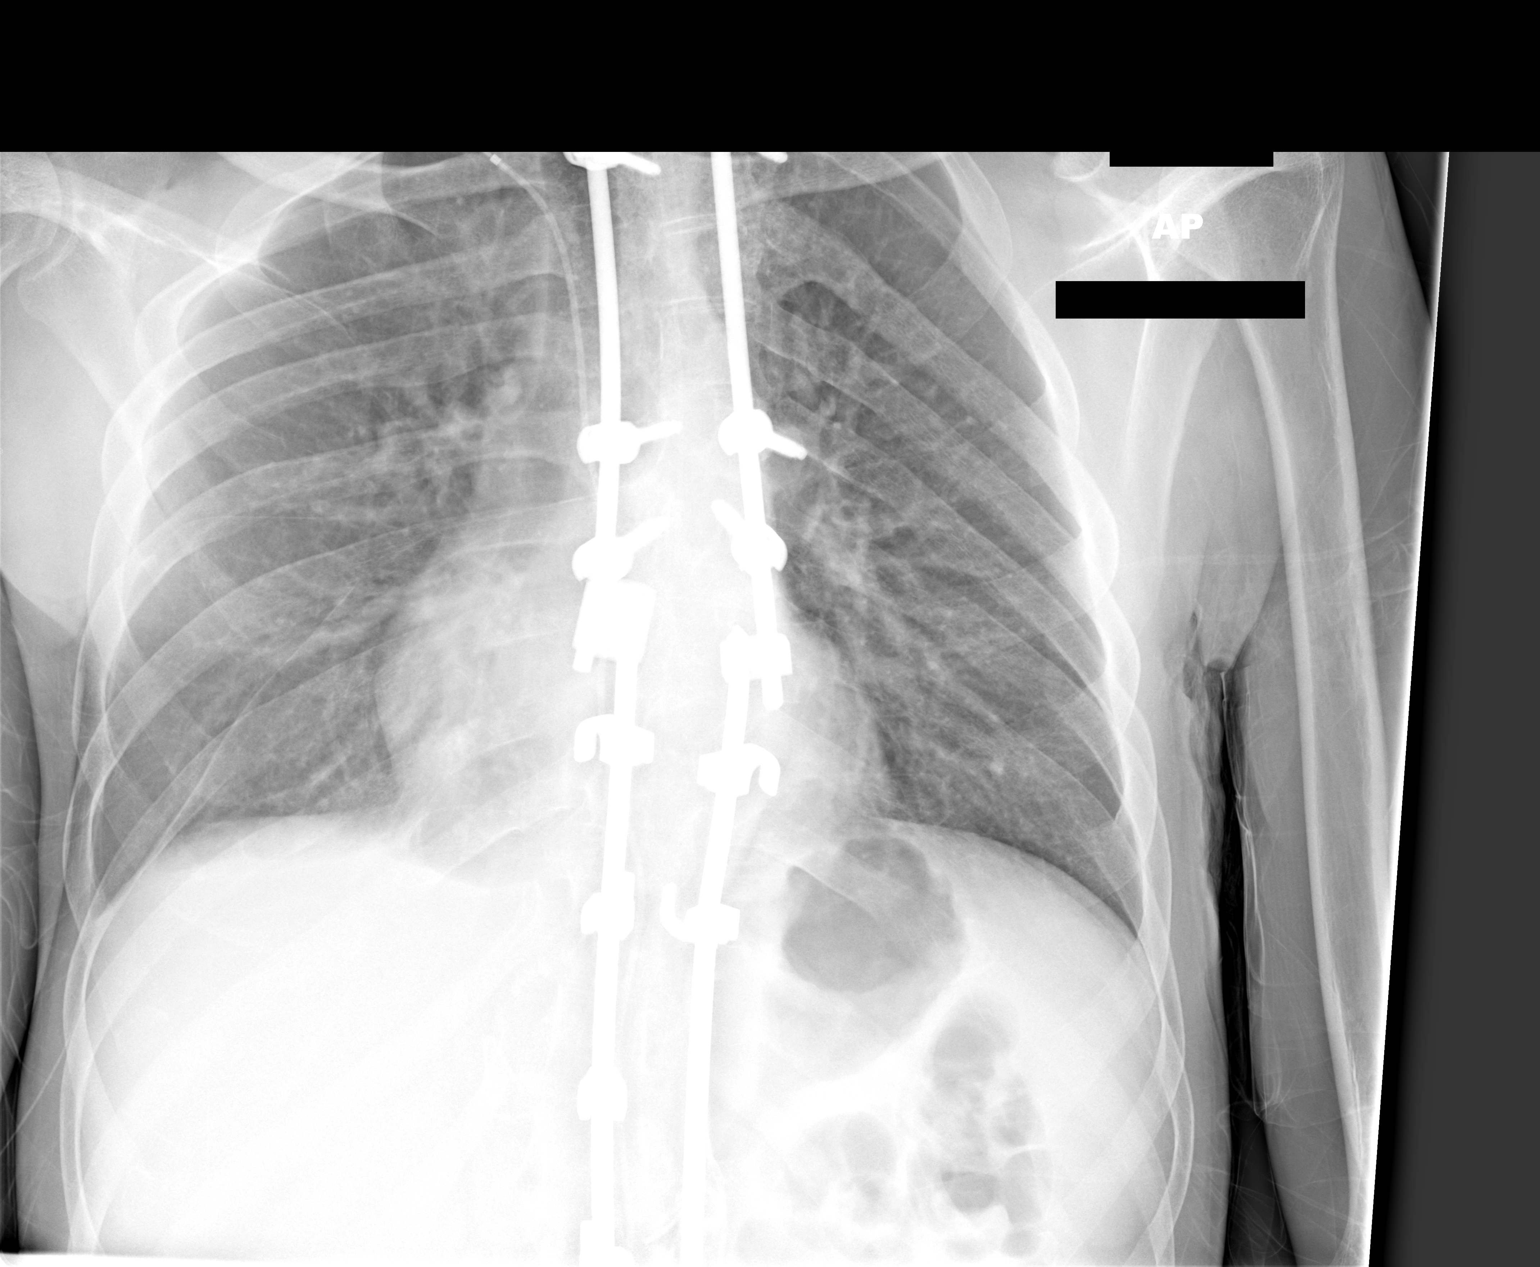

[1 of 1 positions shown; findings below may reference images not displayed]

FINDINGS: Spinal stabilization hardware over the thoracolumbar spine is intact
and unchanged. Catheter segment over the upper thorax right of
midline unchanged. Lungs are adequately inflated with no focal
consolidation or effusion. Cardiomediastinal silhouette and
remainder of the exam is unchanged.
IMPRESSION: No active disease.

## 2014-12-24 ENCOUNTER — Encounter: Payer: Self-pay | Admitting: Family Medicine

## 2014-12-25 DIAGNOSIS — F339 Major depressive disorder, recurrent, unspecified: Secondary | ICD-10-CM | POA: Diagnosis not present

## 2014-12-25 NOTE — Telephone Encounter (Signed)
I have contacted patient's mother regarding note we sent to Dr Agapito Games back on 12/12/14. We have not received any return correspondence. I attempted to contact Dr Bettina Gavia office by phone but was unable to do so as the phone number provided on the [facility] website is no longer a working number. [name], [name] states that they actually have an appointment with Dr Tamera Punt today and will get his input on the Reglan. She will contact me once she has spoken to him.

## 2014-12-27 ENCOUNTER — Ambulatory Visit (INDEPENDENT_AMBULATORY_CARE_PROVIDER_SITE_OTHER): Payer: Medicare Other | Admitting: Medical

## 2014-12-27 ENCOUNTER — Encounter: Payer: Self-pay | Admitting: Medical

## 2014-12-27 ENCOUNTER — Ambulatory Visit (HOSPITAL_BASED_OUTPATIENT_CLINIC_OR_DEPARTMENT_OTHER)
Admission: RE | Admit: 2014-12-27 | Discharge: 2014-12-27 | Disposition: A | Discharge status: Home / Self Care | Payer: Medicare Other | Source: Ambulatory Visit | Attending: Medical | Admitting: Medical

## 2014-12-27 VITALS — BP 101/80 | HR 97 | Temp 97.8°F | Ht 60.0 in | Wt 94.0 lb

## 2014-12-27 DIAGNOSIS — J209 Acute bronchitis, unspecified: Secondary | ICD-10-CM

## 2014-12-27 DIAGNOSIS — R918 Other nonspecific abnormal finding of lung field: Secondary | ICD-10-CM | POA: Diagnosis not present

## 2014-12-27 DIAGNOSIS — J90 Pleural effusion, not elsewhere classified: Secondary | ICD-10-CM | POA: Diagnosis not present

## 2014-12-27 DIAGNOSIS — Z452 Encounter for adjustment and management of vascular access device: Secondary | ICD-10-CM | POA: Diagnosis not present

## 2014-12-27 DIAGNOSIS — R05 Cough: Secondary | ICD-10-CM | POA: Diagnosis present

## 2014-12-27 MED ORDER — CEFTRIAXONE SODIUM 1 G IJ SOLR
1.0000 g | Freq: Once | INTRAMUSCULAR | Status: AC
  Administered 2014-12-27: 1 g via INTRAMUSCULAR

## 2014-12-27 MED ORDER — AMOXICILLIN-POT CLAVULANATE 875-125 MG PO TABS: 1.0000 | ORAL_TABLET | Freq: Two times a day (BID) | ORAL | Status: DC

## 2014-12-27 MED ORDER — AMOXICILLIN-POT CLAVULANATE 600-42.9 MG/5ML PO SUSR: 875.0000 mg | Freq: Two times a day (BID) | ORAL | Status: DC

## 2014-12-27 NOTE — Progress Notes (Signed)
Subjective:    Patient ID: [name], male    DOB: Jun 20, 1986, 29 y.o.   MRN: [id number]  HPI   Pt in with cough all week. Pt temp 100 last night. This am no fever. Pt brought up dark colored thick  mucous. Hx of aspiration pneumonia and mucous. No wheezing reported.  Pt has airway clearance vest. Pt has suction machine since last January.  Appointment next week with neurology.  Hx of cp infantil.  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  Pt on February second had rt lower lobe aspiration pneumonia.     Review of Systems  Constitutional: Positive for fever. Negative for chills and fatigue.  HENT: Negative for congestion.   Respiratory: Positive for cough. Negative for chest tightness, shortness of breath and wheezing.   Cardiovascular: Negative for chest pain and palpitations.  Musculoskeletal: Negative for back pain and gait problem.  Neurological: Negative for dizziness, seizures, facial asymmetry, light-headedness, numbness and headaches.  Hematological: Negative for adenopathy. Does not bruise/bleed easily.    Past Medical History  Diagnosis Date  . Cerebral palsy   . GERD (gastroesophageal reflux disease)   . Hyperthyroidism   . Incontinence of feces   . Palpitations   . Esophagitis 2011  . Dehydration 11/22/2013  . Depression with anxiety 08/01/2010    Qualifier: Diagnosis of  By: Nelson-Smith CMA (AAMA), Dottie    . Loss of weight 08/28/2014  . Depression with anxiety 08/01/2010    Qualifier: Diagnosis of  By: Nelson-Smith CMA (AAMA), Dottie      History   Social History  . Marital Status: Single    Spouse Name: N/A  . Number of Children: 0  . Years of Education: N/A   Occupational History  . disbaled    Social History Main Topics  . Smoking status: Never Smoker   . Smokeless tobacco: Never Used  . Alcohol Use: No  . Drug Use: No  . Sexual Activity: No   Other Topics Concern  . Not on file   Social History Narrative    Past  Surgical History  Procedure Laterality Date  . Spine surgery  ,11/20/2010, 2011    for correction of severe contracturing spinal kyphosis.   . Eye surgery    . Ears tubes    . Hamstring released      to treat contractures.   . Baclofen trial    . Baslofen pump implant    . Spinal fusion    . G-tube insert  August 2006  . Spinal fusioncorrect 106 degree kyphosis    . Spinal fusion to correct 70 degree kyphosis  11-2010  . Tonsillectomy    . Peg placement  10/21/2011    Procedure: PERCUTANEOUS ENDOSCOPIC GASTROSTOMY (PEG) REPLACEMENT;  Surgeon: [name], MD;  Location: [facility] ENDOSCOPY;  Service: Endoscopy;  Laterality: N/A;  . Peg placement N/A 06/13/2013    Procedure: PERCUTANEOUS ENDOSCOPIC GASTROSTOMY (PEG) REPLACEMENT;  Surgeon: [name], MD;  Location: [facility] ENDOSCOPY;  Service: Endoscopy;  Laterality: N/A;  . Hip surgery      x2 , side   . Flexible sigmoidoscopy N/A 09/07/2014    Procedure: FLEXIBLE SIGMOIDOSCOPY;  Surgeon: [name], MD;  Location: [facility] ENDOSCOPY;  Service: Endoscopy;  Laterality: N/A;    Family History  Problem Relation Age of Onset  . Asthma Mother   . Hyperlipidemia Mother   . COPD Mother   . Other Mother     bronchial stasis/ABPA  . Cancer  Maternal Grandmother 77    breast  . Hyperlipidemia Maternal Grandmother   . Hypertension Maternal Grandmother   . Cancer Maternal Grandfather     prostate  . Heart disease Paternal Grandfather     CHF  . Osteoporosis Paternal Grandmother   . Arthritis Paternal Grandmother     rheumatoid    Allergies  Allergen Reactions  . Ambien [Zolpidem Tartrate] Nausea Only  . Codeine   . Baclofen Anxiety  . Sulfonamide Derivatives Rash    Current Outpatient Prescriptions on File Prior to Visit  Medication Sig Dispense Refill  . AMBULATORY NON FORMULARY MEDICATION Medication Name: MIC gastrostomy/bolus feeding tube 24 French Part number 0110-24. #2 and 10 cc lurer lock syringe #2 Dx: 4 Device 2  . bacitracin  500 UNIT/GM ointment Apply 1 application topically 2 (two) times daily. 30 g 1  . clotrimazole-betamethasone (LOTRISONE) cream Apply 1 application topically 2 (two) times daily. 45 g 1  . dantrolene (DANTRIUM) 50 MG capsule Take 1 capsule (50 mg total) by mouth 2 (two) times daily. 60 capsule 3  . diazepam (VALIUM) 2 MG tablet Take 1-2 tablets (2-4 mg total) by mouth at bedtime. 60 tablet 2  . divalproex (DEPAKOTE SPRINKLE) 125 MG capsule Take 2 capsules in morning, 5 capsules at bedtime (Patient taking differently: 250-625 mg. Take 2 capsules in morning, 5 capsules at bedtime) 210 capsule 4  . Feeding Tubes - Bags (FLEXIFLO FEEDING BAG/PUMP) MISC 1 Units by Does not apply route continuous. 1 each 30  . Feeding Tubes - Pump MISC 1 Units by Does not apply route continuous. 1 each 0  . Feeding Tubes - Sets (KANGAROO EPUMP SET 1000ML) MISC 30 day supply of Kangaroo Joey PUmp set with flush bag 1 each 5  . Incontinence Supplies (BARD LEG BAG STRAPS/FABRIC) MISC Bard Dispoz-a-Bag leg bag w/flip flo Valve, sterile, w/Gabric strap, 18" extension tubing 19 oz  Item #[id number] 2 each 6  . Incontinence Supply Disposable (PREVAIL BREEZERS MEDIUM) MISC pkg of 16- size medium 32" to 44"  Breathable cloth-like outer fabric (can't use the plastic outer surgace  Item # [id number]-012/2 16 each 6  . Lactobacillus (PROBIOTIC ACIDOPHILUS PO) Place 30 mLs into feeding tube daily.    Marland Kitchen LORazepam (ATIVAN) 1 MG tablet Take 1 tab po tid prn for anxiety 90 tablet 1  . metoprolol tartrate (LOPRESSOR) 25 MG tablet Take 25 mg by mouth 2 (two) times daily as needed. Palpitaitons, tachycardia    . NON FORMULARY Bard Leg Bag Extension tubing w/Connector 18", Sterile, latex-free  Item# [id number]    . NON FORMULARY Colorplast Freedom Cath Latex Self-Adhering Male External Catheter 58mm Diameter Intermediate  Item# [id number]    . Nutritional Supplements (FEEDING SUPPLEMENT, JEVITY 1.5 CAL,) LIQD Run tube feed at 100 mL per hour over 12  hours. 35550 mL 11  . nystatin (MYCOSTATIN) 100000 UNIT/ML suspension     . nystatin cream (MYCOSTATIN) Apply 1 application topically 2 (two) times daily as needed for dry skin. 30 g 1  . OLANZapine (ZYPREXA) 5 MG tablet Take 5 mg by mouth at bedtime.    Earney Navy Bicarbonate (ZEGERID) 20-1100 MG CAPS capsule Take 1 capsule by mouth daily before breakfast.    . Ostomy Supplies (PROTECTIVE BARRIER WIPES) MISC 1-1/4" X 3"  Item #[id number] 75 each 6  . oxybutynin (DITROPAN) 5 MG/5ML syrup Take 5 mg by mouth 2 (two) times daily.     Marland Kitchen PARoxetine (PAXIL) 10 MG tablet Take 1 tablet (10 mg  total) by mouth daily.    Marland Kitchen PRESCRIPTION MEDICATION G-tube    . PRESCRIPTION MEDICATION Colorplast Freedom Cath Latex Self-Adhering Male External Catheter 54mm Diameter Intermediate  Item# [id number]    . sucralfate (CARAFATE) 1 G tablet 1 tablet twice daily via PEG 60 tablet 1   No current facility-administered medications on file prior to visit.    BP 101/80 mmHg  Pulse 97  Temp(Src) 97.8 F (36.6 C) (Axillary)  Ht 5' (1.524 m)  Wt 94 lb (42.638 kg)  BMI 18.36 kg/m2  SpO2 97%       Objective:   Physical Exam   General  Mental Status - Alert. General Appearance - Well groomed. Not in acute distress.(Wheel chair bound)  Skin Rashes- No Rashes.  HEENT Head- Normal. Ear  Eye Sclera/Conjunctiva- Left- Normal. Right- Normal. Nose & Sinuses Nasal Mucosa- Left-  Not boggy or Congested. Right-  Not  boggy or Congested. Mouth & Throat Not examined.  Neck Neck- Supple. No Masses.no lymphadenopathy.   Chest and Lung Exam Auscultation: Breath Sounds:- even and unlabored. But very shallow. Did not hear any abnormal lung sound.(but cough a lot during interview. Sounds wet)  Cardiovascular Auscultation:Rythm- Regular, rate and rhythm. Murmurs & Other Heart Sounds:Ausculatation of the heart reveal- No Murmurs.  Lymphatic Head & Neck General Head & Neck Lymphatics: Bilateral:  Description- No Localized lymphadenopathy.        Assessment & Plan:

## 2014-12-27 NOTE — Patient Instructions (Addendum)
Acute bronchitis Vs pneumonia. Pt CP conditions may predispose for the latter.  Will get cxr stat and cbc stat.  Will rx augmentin.   Have mom and pt stay down stairs. If xray come back pneumonia will have them come up for rocephin im.  Follow up 3-4 days or as needed.   If worsening signs or symptoms over weekend then ED evalutaion.   Reviewed stat cxr want him to get 1 gram rocephin.

## 2014-12-27 NOTE — Addendum Note (Signed)
Addended by: Harl Bowie on: 12/27/2014 04:47 PM   Modules accepted: Orders

## 2014-12-27 NOTE — Addendum Note (Signed)
Addended by: Bunnie Domino on: 12/27/2014 03:49 PM   Modules accepted: Orders

## 2014-12-27 NOTE — Assessment & Plan Note (Signed)
Vs pneumonia. Pt CP conditions may predispose for the latter.  Will get cxr stat and cbc stat.  Will rx augmentin.   Have mom and pt stay down stairs. If xray come back pneumonia will have them come up for rocephin im.  Follow up 3-4 days or as needed.   If worsening signs or symptoms over weekend then ED evalutaion.

## 2014-12-27 NOTE — Progress Notes (Signed)
Pre visit review using our clinic review tool, if applicable. No additional management support is needed unless otherwise documented below in the visit note. 

## 2014-12-28 LAB — CBC WITH DIFFERENTIAL/PLATELET
Basophils Absolute: 0 10*3/uL (ref 0.0–0.1)
Basophils Relative: 0.5 % (ref 0.0–3.0)
Eosinophils Absolute: 0 10*3/uL (ref 0.0–0.7)
Eosinophils Relative: 0.5 % (ref 0.0–5.0)
HCT: 44.3 % (ref 39.0–52.0)
Hemoglobin: 15.1 g/dL (ref 13.0–17.0)
Lymphocytes Relative: 18.3 % (ref 12.0–46.0)
Lymphs Abs: 1.6 10*3/uL (ref 0.7–4.0)
MCHC: 34.1 g/dL (ref 30.0–36.0)
MCV: 88.7 fl (ref 78.0–100.0)
Monocytes Absolute: 0.4 10*3/uL (ref 0.1–1.0)
Monocytes Relative: 4.1 % (ref 3.0–12.0)
Neutro Abs: 6.9 10*3/uL (ref 1.4–7.7)
Neutrophils Relative %: 76.6 % (ref 43.0–77.0)
Platelets: 197 10*3/uL (ref 150.0–400.0)
RBC: 5 Mil/uL (ref 4.22–5.81)
RDW: 13.2 % (ref 11.5–15.5)
WBC: 9 10*3/uL (ref 4.0–10.5)

## 2014-12-30 IMAGING — CR DG CHEST 1V PORT
1 series · 1 of 1 positions shown · non-contrast
Comparison: 05/17/2014

CLINICAL DATA: Upper respiratory symptoms, cerebral palsy, prior
spinal fusion

EXAM:
PORTABLE CHEST - 1 VIEW

[view not recorded]
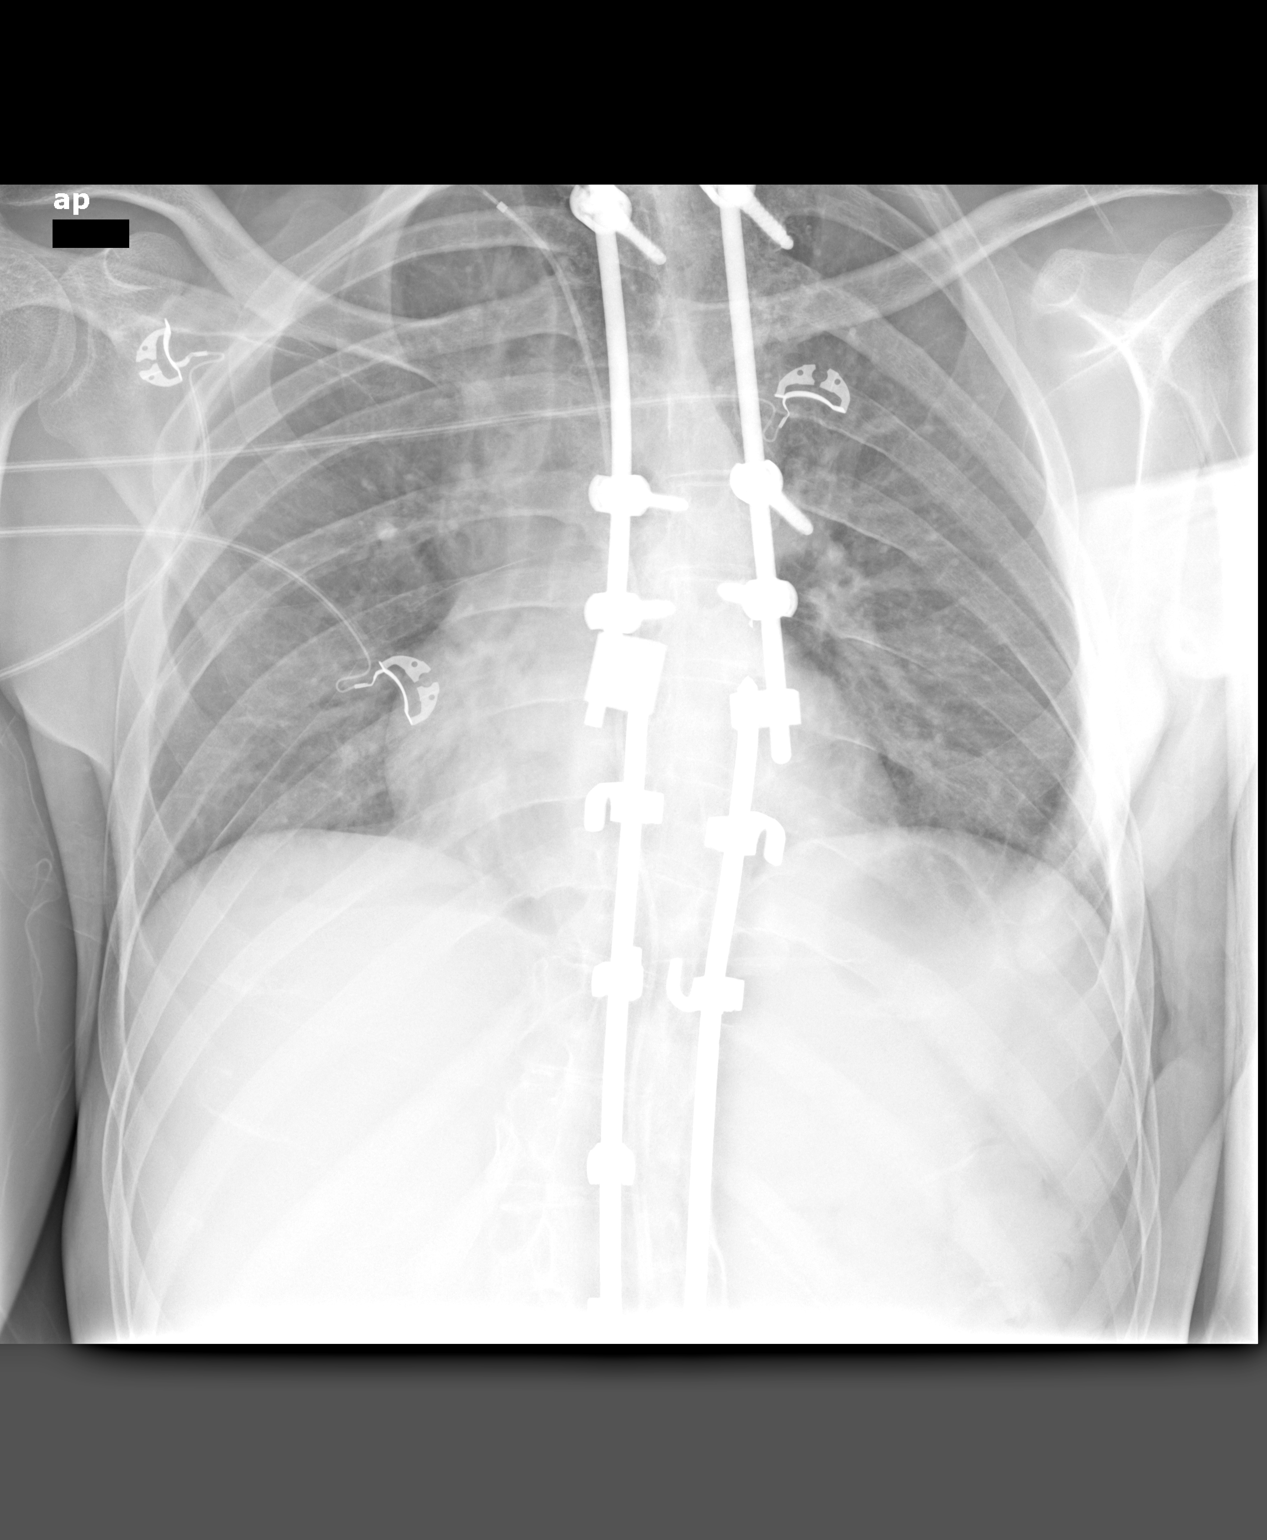

[1 of 1 positions shown; findings below may reference images not displayed]

FINDINGS: Slight rotation to the right. Fusion hardware noted of the entire
visualized spine. Normal heart size and vascularity. No focal
pneumonia, collapse or consolidation. No edema, effusion or
pneumothorax. Stable catheter/tubing over the spine, suspect prior
pump implant tubing.
IMPRESSION: Stable chest exam.  No new process.

## 2014-12-31 ENCOUNTER — Encounter: Payer: Self-pay | Admitting: Medical

## 2014-12-31 ENCOUNTER — Ambulatory Visit (INDEPENDENT_AMBULATORY_CARE_PROVIDER_SITE_OTHER): Payer: Medicare Other | Admitting: Medical

## 2014-12-31 VITALS — BP 102/65 | HR 78 | Temp 98.8°F | Ht 61.0 in | Wt 94.0 lb

## 2014-12-31 DIAGNOSIS — J209 Acute bronchitis, unspecified: Secondary | ICD-10-CM

## 2014-12-31 DIAGNOSIS — R197 Diarrhea, unspecified: Secondary | ICD-10-CM

## 2014-12-31 DIAGNOSIS — R918 Other nonspecific abnormal finding of lung field: Secondary | ICD-10-CM

## 2014-12-31 MED ORDER — DIPHENOXYLATE-ATROPINE 2.5-0.025 MG PO TABS: 1.0000 | ORAL_TABLET | Freq: Four times a day (QID) | ORAL | Status: DC | PRN

## 2014-12-31 NOTE — Patient Instructions (Signed)
Acute bronchitis Vs pneumonia.   Continue the augmentin.  Will get ct of the chest as recommended by radiologist hopefully sometime next week. If respiratory signs or symptoms worsen or change notify us.   Diarrhea Stool panel studies including c dif recommended. Antiobitoic side effect vs c dif is possible.  Rx lomotil to help control diarrhea for 2 days then use only immodium.   Turn in studies as soon as possible. Keep him hydrated.     Benefit vs risk of advised with mom.  Follow up in 7 days or as needed.

## 2014-12-31 NOTE — Progress Notes (Signed)
   Subjective:    Patient ID: [name], male    DOB: [birth year], 29 y.o.   MRN: [id number]  HPI   Pt in for follow up. His cough is better as of yesterday. He sounds clearer today. No fever, or chills or sweats.  Pt is on 5th day of Augmentin.   He is having 3 loose stools. ( no hx of c dificile.) Watery like. No hx of c dif.      Review of Systems  Constitutional: Negative for fever, chills and fatigue.  Respiratory: Positive for cough. Negative for chest tightness, shortness of breath and wheezing.        Got some better since yesterday.  Cardiovascular: Negative for chest pain and palpitations.  Gastrointestinal: Positive for diarrhea. Negative for nausea, vomiting, abdominal pain, constipation and abdominal distention.  Musculoskeletal: Negative for back pain.       Objective:   Physical Exam  General  Mental Status - Alert. General Appearance - Well groomed. Not in acute distress. He looks better today. Brighter affect.  Skin Rashes- No Rashes.  HEENT Head- Normal. Ear Auditory Canal - Left- Normal. Right - Normal.Tympanic Membrane- Left- Normal. Right- Normal. Eye Sclera/Conjunctiva- Left- Normal. Right- Normal. Nose & Sinuses Nasal Mucosa- Left-  Not boggy or Congested. Right-  Not  boggy or Congested. Mouth & Throat Lips: Upper Lip- Normal: no dryness, cracking, pallor, cyanosis, or vesicular eruption. Lower Lip-Normal: no dryness, cracking, pallor, cyanosis or vesicular eruption. Buccal Mucosa- Bilateral- No Aphthous ulcers. Oropharynx- No Discharge or Erythema. Tonsils: Characteristics- Bilateral- No Erythema or Congestion. Size/Enlargement- Bilateral- No enlargement. Discharge- bilateral-None.  Neck Neck- Supple. No Masses.   Chest and Lung Exam Auscultation: Breath Sounds:- even and unlabored, decreased breath sounds.  Cardiovascular Auscultation:Rythm- Regular, rate and rhythm. Murmurs & Other Heart Sounds:Ausculatation of the heart reveal-  No Murmurs.  Lymphatic Head & Neck General Head & Neck Lymphatics: Bilateral: Description- No Localized lymphadenopathy.       Assessment & Plan:

## 2014-12-31 NOTE — Assessment & Plan Note (Signed)
Stool panel studies including c dif recommended. Antiobitoic side effect vs c dif is possible.  Rx lomotil to help control diarrhea for 2 days then use only immodium.   Turn in studies as soon as possible. Keep him hydrated.

## 2014-12-31 NOTE — Telephone Encounter (Signed)
Notified mom of plan to do ct of the chest per radiologist.

## 2014-12-31 NOTE — Assessment & Plan Note (Signed)
Vs pneumonia.   Continue the augmentin.  Will get ct of the chest as recommended by radiologist hopefully sometime next week. If respiratory signs or symptoms worsen or change notify us.

## 2014-12-31 NOTE — Progress Notes (Signed)
Pre visit review using our clinic review tool, if applicable. No additional management support is needed unless otherwise documented below in the visit note. 

## 2015-01-01 ENCOUNTER — Other Ambulatory Visit: Payer: Medicare Other

## 2015-01-01 DIAGNOSIS — R197 Diarrhea, unspecified: Secondary | ICD-10-CM | POA: Diagnosis not present

## 2015-01-02 DIAGNOSIS — R1319 Other dysphagia: Secondary | ICD-10-CM | POA: Diagnosis not present

## 2015-01-02 DIAGNOSIS — G800 Spastic quadriplegic cerebral palsy: Secondary | ICD-10-CM | POA: Diagnosis not present

## 2015-01-02 LAB — OVA AND PARASITE EXAMINATION: OP: NONE SEEN

## 2015-01-02 LAB — CLOSTRIDIUM DIFFICILE BY PCR: Toxigenic C. Difficile by PCR: NOT DETECTED

## 2015-01-05 LAB — STOOL CULTURE

## 2015-01-08 ENCOUNTER — Ambulatory Visit: Payer: Medicare Other | Admitting: Medical

## 2015-01-08 ENCOUNTER — Ambulatory Visit (HOSPITAL_BASED_OUTPATIENT_CLINIC_OR_DEPARTMENT_OTHER): Payer: Medicare Other

## 2015-01-11 ENCOUNTER — Telehealth: Payer: Self-pay | Admitting: Internal Medicine

## 2015-01-11 NOTE — Telephone Encounter (Signed)
Spoke with pts mother and she will keep appt as scheduled and knows to call if pt needs to be seen sooner.

## 2015-01-11 NOTE — Telephone Encounter (Signed)
Pt has had aspiration pneumonia several times recently. Was seen by neurologist and nutritionist at [facility] and they recommend pt have a J-tube placed instead of G-tube. Scheduled pt for 1st available OV in May for mother to discuss with Dr. Hilarie Fredrickson. Mother wanted to know if Dr. Hilarie Fredrickson agrees with this and thinks it will make a difference. States that when the pump is running pt does have gurgly cough.

## 2015-01-11 NOTE — Telephone Encounter (Signed)
I would think that J-tube feeding would make sense and should have a significantly lower risk of aspiration associated with tube feeds A G-J tube may be best, which has a gastric port for meds and a j-port for feedings I assume he relies on the g-tube for medication as well? Happy to answer questions and apologize for very long wait for appt.  Can be worked in if this is not sufficient, just let me know Thanks

## 2015-01-13 ENCOUNTER — Encounter: Payer: Self-pay | Admitting: Internal Medicine

## 2015-01-13 DIAGNOSIS — J690 Pneumonitis due to inhalation of food and vomit: Secondary | ICD-10-CM

## 2015-01-14 ENCOUNTER — Ambulatory Visit (HOSPITAL_BASED_OUTPATIENT_CLINIC_OR_DEPARTMENT_OTHER): Payer: Medicare Other

## 2015-01-14 ENCOUNTER — Other Ambulatory Visit: Payer: Self-pay

## 2015-01-14 DIAGNOSIS — T17908D Unspecified foreign body in respiratory tract, part unspecified causing other injury, subsequent encounter: Secondary | ICD-10-CM

## 2015-01-14 NOTE — Telephone Encounter (Signed)
The change out of a GJ tube is more complex than a G-tube and likely requires radiology each time. That being said it is likely better for him so that he can avoid aspiration Please arrange for him to see IR for placement of a GJ tube, hopefully using his current G-tube site. If this is not possible, please notify me Have Mrs. Ourada notify me of any problems/questions/concerns

## 2015-01-16 ENCOUNTER — Encounter (HOSPITAL_BASED_OUTPATIENT_CLINIC_OR_DEPARTMENT_OTHER): Payer: Self-pay

## 2015-01-16 ENCOUNTER — Ambulatory Visit (HOSPITAL_BASED_OUTPATIENT_CLINIC_OR_DEPARTMENT_OTHER)
Admission: RE | Admit: 2015-01-16 | Discharge: 2015-01-16 | Disposition: A | Discharge status: Home / Self Care | Payer: Medicare Other | Source: Ambulatory Visit | Attending: Medical | Admitting: Medical

## 2015-01-16 DIAGNOSIS — J8410 Pulmonary fibrosis, unspecified: Secondary | ICD-10-CM | POA: Diagnosis not present

## 2015-01-16 DIAGNOSIS — R918 Other nonspecific abnormal finding of lung field: Secondary | ICD-10-CM | POA: Insufficient documentation

## 2015-01-16 DIAGNOSIS — J90 Pleural effusion, not elsewhere classified: Secondary | ICD-10-CM | POA: Diagnosis not present

## 2015-01-16 DIAGNOSIS — G809 Cerebral palsy, unspecified: Secondary | ICD-10-CM | POA: Insufficient documentation

## 2015-01-16 MED ORDER — IOHEXOL 300 MG/ML  SOLN
80.0000 mL | Freq: Once | INTRAMUSCULAR | Status: AC | PRN
  Administered 2015-01-16: 80 mL via INTRAVENOUS

## 2015-01-17 NOTE — Telephone Encounter (Signed)
Reviewed ct finding of chest with mom. Explained to mom would refer him back to his pulmonologist for follow up regarding the recent chest congestion and possible pneumonia. Also to review ct of the chest.

## 2015-01-22 ENCOUNTER — Other Ambulatory Visit (HOSPITAL_COMMUNITY): Payer: Medicare Other

## 2015-01-25 DIAGNOSIS — G800 Spastic quadriplegic cerebral palsy: Secondary | ICD-10-CM | POA: Diagnosis not present

## 2015-01-28 ENCOUNTER — Telehealth: Payer: Self-pay | Admitting: Pulmonary Disease

## 2015-01-28 NOTE — Telephone Encounter (Signed)
Spoke with Milagros Loll  She states that she faxed over a medicaid form on this pt to Rhonda's attn on 01/23/15  She is asking for the status of this  Please advise thanks

## 2015-01-28 NOTE — Telephone Encounter (Signed)
I saw the cover sheet but didn't see the second sheet. I don't know if Alida pulled this off and placed it in Dr. Bari Mantis look at or not. The cover sheet was lying on my desk on Thurs 01/24/15 when I returned from being in [facility] for two days. Will need to check with Alida when she returns tomorrow. Rhonda J Cobb

## 2015-01-29 ENCOUNTER — Encounter: Payer: Self-pay | Admitting: Family Medicine

## 2015-01-29 NOTE — Telephone Encounter (Signed)
Thom Ollinger form is in Dr Elsworth Soho blue to be signed folder, will give folder to his nurse .Verdie Mosher

## 2015-01-29 NOTE — Telephone Encounter (Signed)
OK, will forward to Alida  Please advise, thanks

## 2015-01-30 ENCOUNTER — Ambulatory Visit (HOSPITAL_COMMUNITY)
Admission: RE | Admit: 2015-01-30 | Discharge: 2015-01-30 | Disposition: A | Discharge status: Home / Self Care | Payer: Medicare Other | Source: Ambulatory Visit | Attending: Interventional Radiology | Admitting: Interventional Radiology

## 2015-01-30 DIAGNOSIS — Z431 Encounter for attention to gastrostomy: Secondary | ICD-10-CM | POA: Diagnosis not present

## 2015-01-30 DIAGNOSIS — J690 Pneumonitis due to inhalation of food and vomit: Secondary | ICD-10-CM | POA: Insufficient documentation

## 2015-01-30 DIAGNOSIS — T17908D Unspecified foreign body in respiratory tract, part unspecified causing other injury, subsequent encounter: Secondary | ICD-10-CM

## 2015-01-30 MED ORDER — IOHEXOL 300 MG/ML  SOLN
10.0000 mL | Freq: Once | INTRAMUSCULAR | Status: AC | PRN
  Administered 2015-01-30: 10 mL

## 2015-01-30 NOTE — Procedures (Signed)
Successful conversion gtube to a 24 fr GJ tube No comp Stable Ready for use

## 2015-01-31 ENCOUNTER — Encounter: Payer: Self-pay | Admitting: Family Medicine

## 2015-02-05 ENCOUNTER — Other Ambulatory Visit (HOSPITAL_COMMUNITY): Payer: Medicare Other

## 2015-02-11 ENCOUNTER — Ambulatory Visit: Payer: Medicare Other | Admitting: Family Medicine

## 2015-02-11 NOTE — Telephone Encounter (Signed)
Linda Please check with [name]'s mom after having G tube converted to [place] to make sure all is working well Thanks [employer]

## 2015-02-12 ENCOUNTER — Ambulatory Visit (INDEPENDENT_AMBULATORY_CARE_PROVIDER_SITE_OTHER): Payer: Medicare Other | Admitting: Family Medicine

## 2015-02-12 ENCOUNTER — Encounter: Payer: Self-pay | Admitting: Family Medicine

## 2015-02-12 VITALS — BP 118/72 | HR 108 | Temp 97.9°F | Wt 92.0 lb

## 2015-02-12 DIAGNOSIS — R634 Abnormal weight loss: Secondary | ICD-10-CM

## 2015-02-12 DIAGNOSIS — R633 Feeding difficulties, unspecified: Secondary | ICD-10-CM

## 2015-02-12 DIAGNOSIS — D649 Anemia, unspecified: Secondary | ICD-10-CM

## 2015-02-12 NOTE — Progress Notes (Signed)
Pre visit review using our clinic review tool, if applicable. No additional management support is needed unless otherwise documented below in the visit note. 

## 2015-02-12 NOTE — Patient Instructions (Signed)
Needs a short appt in late July   Needs a medicare wellness visit in 6 months.   Food Choices for Gastroesophageal Reflux Disease When you have gastroesophageal reflux disease (GERD), the foods you eat and your eating habits are very important. Choosing the right foods can help ease your discomfort.  WHAT GUIDELINES DO I NEED TO FOLLOW?   Choose fruits, vegetables, whole grains, and low-fat dairy products.   Choose low-fat meat, fish, and poultry.  Limit fats such as oils, salad dressings, butter, nuts, and avocado.   Keep a food diary. This helps you identify foods that cause symptoms.   Avoid foods that cause symptoms. These may be different for everyone.   Eat small meals often instead of 3 large meals a day.   Eat your meals slowly, in a place where you are relaxed.   Limit fried foods.   Cook foods using methods other than frying.   Avoid drinking alcohol.   Avoid drinking large amounts of liquids with your meals.   Avoid bending over or lying down until 2-3 hours after eating.  WHAT FOODS ARE NOT RECOMMENDED?  These are some foods and drinks that may make your symptoms worse: Vegetables Tomatoes. Tomato juice. Tomato and spaghetti sauce. Chili peppers. Onion and garlic. Horseradish. Fruits Oranges, grapefruit, and lemon (fruit and juice). Meats High-fat meats, fish, and poultry. This includes hot dogs, ribs, ham, sausage, salami, and bacon. Dairy Whole milk and chocolate milk. Sour cream. Cream. Butter. Ice cream. Cream cheese.  Drinks Coffee and tea. Bubbly (carbonated) drinks or energy drinks. Condiments Hot sauce. Barbecue sauce.  Sweets/Desserts Chocolate and cocoa. Donuts. Peppermint and spearmint. Fats and Oils High-fat foods. This includes [place] fries and potato chips. Other Vinegar. Strong spices. This includes black pepper, white pepper, red pepper, cayenne, curry powder, cloves, ginger, and chili powder. The items listed above may not be  a complete list of foods and drinks to avoid. Contact your dietitian for more information. Document Released: 04/19/2012 Document Revised: 10/24/2013 Document Reviewed: 08/23/2013 [facility] Patient Information 2015 [place], [place]. This information is not intended to replace advice given to you by your health care provider. Make sure you discuss any questions you have with your health care provider.

## 2015-02-14 ENCOUNTER — Encounter: Payer: Self-pay | Admitting: Pulmonary Disease

## 2015-02-14 ENCOUNTER — Ambulatory Visit (HOSPITAL_BASED_OUTPATIENT_CLINIC_OR_DEPARTMENT_OTHER)
Admission: RE | Admit: 2015-02-14 | Discharge: 2015-02-14 | Disposition: A | Discharge status: Home / Self Care | Payer: Medicare Other | Source: Ambulatory Visit | Attending: Pulmonary Disease | Admitting: Pulmonary Disease

## 2015-02-14 ENCOUNTER — Ambulatory Visit (INDEPENDENT_AMBULATORY_CARE_PROVIDER_SITE_OTHER): Payer: Medicare Other | Admitting: Pulmonary Disease

## 2015-02-14 VITALS — BP 115/74 | HR 75 | Temp 98.3°F | Ht 62.0 in | Wt 92.0 lb

## 2015-02-14 DIAGNOSIS — M419 Scoliosis, unspecified: Secondary | ICD-10-CM | POA: Diagnosis not present

## 2015-02-14 DIAGNOSIS — J439 Emphysema, unspecified: Secondary | ICD-10-CM | POA: Diagnosis not present

## 2015-02-14 DIAGNOSIS — J690 Pneumonitis due to inhalation of food and vomit: Secondary | ICD-10-CM

## 2015-02-14 DIAGNOSIS — J984 Other disorders of lung: Secondary | ICD-10-CM

## 2015-02-14 DIAGNOSIS — J9811 Atelectasis: Secondary | ICD-10-CM

## 2015-02-14 DIAGNOSIS — J90 Pleural effusion, not elsewhere classified: Secondary | ICD-10-CM | POA: Insufficient documentation

## 2015-02-14 DIAGNOSIS — R05 Cough: Secondary | ICD-10-CM | POA: Diagnosis not present

## 2015-02-14 NOTE — Progress Notes (Signed)
   Subjective:    Patient ID: [name], male    DOB: 02-22-1986, 29 y.o.   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 He has cerebral palsy and needs assistance for all activities of daily living, baseline wheelchair-bound. Underwent T9-C6 spinal fusion at [facility] in Sept 2011.  PEG placed in 2012 for hydration.  He was hospitalized in 08/2013 for aspiration pneumonia,Swallow evaluation showed severe dysphagia with high risk of aspirating. He has 4 caregivers at home.  Started back on tube feeds exclusively due to aspiration 07/2014 - weight, is down to 95 pounds 01/30/2015 - PEJ placed    02/14/2015  Chief Complaint  Patient presents with  . Follow-up    chronic cough, coughing up white foamy mucus, discuss CT scan from last month   Six-month follow-up, accompanied by mother He continues to have problems with secretion clearance, is compliant with his vest, complains of a wet cough, he had hospitalization in 09/2014 He had another episode but quickly received antibiotics from PCP and got over it Gastrostomy was recently converted to a gastrojejunostomy, mom hopes episodes will be the lesser  01/16/15 CT chest with contrast -Volume loss in the RIGHT lower lobe with rounding of vessels suggesting rounded atelectasis, Pleural fluid collection at posterior inferior RIGHT hemi thorax  Chest x-ray today-persistent right pleural effusion,  Review of Systems neg for any significant sore throat, dysphagia, itching, sneezing, nasal congestion or excess/ purulent secretions, fever, chills, sweats, unintended wt loss, pleuritic or exertional cp, hempoptysis, orthopnea pnd or change in chronic leg swelling. Also denies presyncope, palpitations, heartburn, abdominal pain, nausea, vomiting, diarrhea or change in bowel or urinary habits, dysuria,hematuria, rash, arthralgias, visual complaints,  headache, numbness weakness or ataxia.     Objective:   Physical Exam  Gen. Pleasant, poorly nourished, in wheelchair no distress ENT - no lesions, no post nasal drip Neck: No JVD, no thyromegaly, no carotid bruits Lungs: no use of accessory muscles, scoliosis ,no dullness to percussion, decreased breath sounds bilateral without rales or rhonchi  Cardiovascular: Rhythm regular, heart sounds  normal, no murmurs or gallops, no peripheral edema Musculoskeletal: Scoliosis, no cyanosis or clubbing        Assessment & Plan:

## 2015-02-14 NOTE — Patient Instructions (Signed)
Call me if breathing worse or secretions worse or pneumonia recurs Keep using vest CXR today

## 2015-02-15 ENCOUNTER — Encounter: Payer: Self-pay | Admitting: *Deleted

## 2015-02-15 NOTE — Assessment & Plan Note (Signed)
We may have to obtain an arterial blood gas in the future to see if there is CO2 retention

## 2015-02-15 NOTE — Assessment & Plan Note (Signed)
We discussed risks and benefits of a bronchoscopy in the setting- while it may give him some relief from secretions for a day or 2, I'm afraid that [name] is aspirating continuously and his secretions would come right back. As such, I will perform bronchoscopy only if there is definite lobar atelectasis or if he gets into severe trouble with the secretions. Conscious sedation were also pose some risks for [name]. Call me if breathing worse or secretions worse or pneumonia recurs Keep using vest

## 2015-02-17 ENCOUNTER — Encounter: Payer: Self-pay | Admitting: Family Medicine

## 2015-02-17 NOTE — Progress Notes (Signed)
[name]  [id number] October 27, 1986 02/17/2015      Progress Note-Follow Up  Subjective  Chief Complaint  Chief Complaint  Patient presents with  . Follow-up    HPI  Patient is a 29 y.o. male in today for routine medical care. He is here today in his wheelchair in good spirits today. Accompanied by his mother and healthy age. He has recently undergone some workup at [facility]. ANS shows no new dysfunction. follows with Dr Ileene Rubens of neurology at [facility] is setting him up with PT and speech and for some OT and botox injections for his contractures. Has recently had a PEJ tube placed. Continues to struggle with aspirations but no recent fever or hospitalization. They are not giving him any further food by mouth is tolerating tube feeds. Less anxiety recently  Past Medical History  Diagnosis Date  . Cerebral palsy   . GERD (gastroesophageal reflux disease)   . Hyperthyroidism   . Incontinence of feces   . Palpitations   . Esophagitis 2011  . Dehydration 11/22/2013  . Loss of weight 08/28/2014  . Depression with anxiety 08/01/2010    Qualifier: Diagnosis of  By: Nelson-Smith CMA (AAMA), Dottie      Past Surgical History  Procedure Laterality Date  . Spine surgery  ,11/20/2010, 2011    for correction of severe contracturing spinal kyphosis.   . Eye surgery    . Ears tubes    . Hamstring released      to treat contractures.   . Baclofen trial    . Baslofen pump implant    . Spinal fusion    . G-tube insert  August 2006  . Spinal fusioncorrect 106 degree kyphosis    . Spinal fusion to correct 70 degree kyphosis  11-2010  . Tonsillectomy    . Peg placement  10/21/2011    Procedure: PERCUTANEOUS ENDOSCOPIC GASTROSTOMY (PEG) REPLACEMENT;  Surgeon: [name], MD;  Location: [facility] ENDOSCOPY;  Service: Endoscopy;  Laterality: N/A;  . Peg placement N/A 06/13/2013    Procedure: PERCUTANEOUS ENDOSCOPIC GASTROSTOMY (PEG) REPLACEMENT;  Surgeon: [name], MD;  Location: [facility]  ENDOSCOPY;  Service: Endoscopy;  Laterality: N/A;  . Hip surgery      x2 , side   . Flexible sigmoidoscopy N/A 09/07/2014    Procedure: FLEXIBLE SIGMOIDOSCOPY;  Surgeon: [name], MD;  Location: [facility] ENDOSCOPY;  Service: Endoscopy;  Laterality: N/A;    Family History  Problem Relation Age of Onset  . Asthma Mother   . Hyperlipidemia Mother   . COPD Mother   . Other Mother     bronchial stasis/ABPA  . Cancer Maternal Grandmother 6    breast  . Hyperlipidemia Maternal Grandmother   . Hypertension Maternal Grandmother   . Cancer Maternal Grandfather     prostate  . Heart disease Paternal Grandfather     CHF  . Osteoporosis Paternal Grandmother   . Arthritis Paternal Grandmother     rheumatoid    History   Social History  . Marital Status: Single    Spouse Name: N/A  . Number of Children: 0  . Years of Education: N/A   Occupational History  . disbaled    Social History Main Topics  . Smoking status: Never Smoker   . Smokeless tobacco: Never Used  . Alcohol Use: No  . Drug Use: No  . Sexual Activity: No   Other Topics Concern  . Not on file   Social History Narrative  Current Outpatient Prescriptions on File Prior to Visit  Medication Sig Dispense Refill  . AMBULATORY NON FORMULARY MEDICATION Medication Name: MIC gastrostomy/bolus feeding tube 24 French Part number 0110-24. #2 and 10 cc lurer lock syringe #2 Dx: 4 Device 2  . bacitracin 500 UNIT/GM ointment Apply 1 application topically 2 (two) times daily. 30 g 1  . clotrimazole-betamethasone (LOTRISONE) cream Apply 1 application topically 2 (two) times daily. 45 g 1  . dantrolene (DANTRIUM) 50 MG capsule Take 1 capsule (50 mg total) by mouth 2 (two) times daily. 60 capsule 3  . diazepam (VALIUM) 2 MG tablet Take 1-2 tablets (2-4 mg total) by mouth at bedtime. 60 tablet 2  . diphenoxylate-atropine (LOMOTIL) 2.5-0.025 MG per tablet Take 1 tablet by mouth 4 (four) times daily as needed for diarrhea or loose  stools. 8 tablet 0  . divalproex (DEPAKOTE SPRINKLE) 125 MG capsule Take 2 capsules in morning, 5 capsules at bedtime (Patient taking differently: 250-625 mg. Take 2 capsules in morning, 5 capsules at bedtime) 210 capsule 4  . Feeding Tubes - Bags (FLEXIFLO FEEDING BAG/PUMP) MISC 1 Units by Does not apply route continuous. 1 each 30  . Feeding Tubes - Pump MISC 1 Units by Does not apply route continuous. 1 each 0  . Feeding Tubes - Sets (KANGAROO EPUMP SET 1000ML) MISC 30 day supply of Kangaroo Joey PUmp set with flush bag 1 each 5  . Incontinence Supplies (BARD LEG BAG STRAPS/FABRIC) MISC Bard Dispoz-a-Bag leg bag w/flip flo Valve, sterile, w/Gabric strap, 18" extension tubing 19 oz  Item #[id number] 2 each 6  . Incontinence Supply Disposable (PREVAIL BREEZERS MEDIUM) MISC pkg of 16- size medium 32" to 44"  Breathable cloth-like outer fabric (can't use the plastic outer surgace  Item # [id number]/[id number] 16 each 6  . LORazepam (ATIVAN) 1 MG tablet Take 1 tab po tid prn for anxiety 90 tablet 1  . metoprolol tartrate (LOPRESSOR) 25 MG tablet Take 25 mg by mouth 2 (two) times daily as needed. Palpitaitons, tachycardia    . NON FORMULARY Bard Leg Bag Extension tubing w/Connector 18", Sterile, latex-free  Item# [id number]    . NON FORMULARY Colorplast Freedom Cath Latex Self-Adhering Male External Catheter 61mm Diameter Intermediate  Item# [id number]    . Nutritional Supplements (FEEDING SUPPLEMENT, JEVITY 1.5 CAL,) LIQD Run tube feed at 100 mL per hour over 12 hours. 35550 mL 11  . nystatin (MYCOSTATIN) 100000 UNIT/ML suspension     . nystatin cream (MYCOSTATIN) Apply 1 application topically 2 (two) times daily as needed for dry skin. 30 g 1  . OLANZapine (ZYPREXA) 5 MG tablet Take 5 mg by mouth at bedtime.    Earney Navy Bicarbonate (ZEGERID) 20-1100 MG CAPS capsule Take 1 capsule by mouth daily before breakfast.    . Ostomy Supplies (PROTECTIVE BARRIER WIPES) MISC 1-1/4" X 3"  Item #[id number] 75 each  6  . oxybutynin (DITROPAN) 5 MG/5ML syrup Take 5 mg by mouth 2 (two) times daily.     Marland Kitchen PARoxetine (PAXIL) 10 MG tablet Take 1 tablet (10 mg total) by mouth daily.    Marland Kitchen PRESCRIPTION MEDICATION Colorplast Freedom Cath Latex Self-Adhering Male External Catheter 30mm Diameter Intermediate  Item# [id number]    . sucralfate (CARAFATE) 1 G tablet 1 tablet twice daily via PEG 60 tablet 1   No current facility-administered medications on file prior to visit.    Allergies  Allergen Reactions  . Ambien [Zolpidem Tartrate] Nausea Only  . Codeine Other (See Comments)  Makes patient too active after a few days.  . Baclofen Anxiety  . Sulfonamide Derivatives Rash    Review of Systems  ROS  Objective  BP 118/72 mmHg  Pulse 108  Temp(Src) 97.9 F (36.6 C) (Oral)  Wt 92 lb (41.731 kg)  SpO2 98%  Physical Exam  Physical Exam  Lab Results  Component Value Date   TSH 0.774 06/08/2014   Lab Results  Component Value Date   WBC 9.0 12/27/2014   HGB 15.1 12/27/2014   HCT 44.3 12/27/2014   MCV 88.7 12/27/2014   PLT 197.0 12/27/2014   Lab Results  Component Value Date   CREATININE 0.4 10/16/2014   BUN 10 10/16/2014   NA 139 10/16/2014   K 4.0 10/16/2014   CL 104 10/16/2014   CO2 28 10/16/2014   Lab Results  Component Value Date   ALT 23 10/16/2014   AST 25 10/16/2014   ALKPHOS 84 10/16/2014   BILITOT 0.4 10/16/2014   Lab Results  Component Value Date   CHOL 151 04/25/2010   Lab Results  Component Value Date   HDL 29.90* 04/25/2010   Lab Results  Component Value Date   LDLCALC 96 04/25/2010   Lab Results  Component Value Date   TRIG 125.0 04/25/2010   Lab Results  Component Value Date   CHOLHDL 5 04/25/2010     Assessment & Plan  FEEDING PROBLEM Now with PEJ tube, is struggling with frequent aspiration but appears well today. No longer tolerating food by mouth. Continue same   Anemia resolved   Loss of weight Are trying to get his feeds up to 5  cans to help stabilize his weight, is tolerating 4 cans daily.

## 2015-02-17 NOTE — Assessment & Plan Note (Signed)
Now with PEJ tube, is struggling with frequent aspiration but appears well today. No longer tolerating food by mouth. Continue same

## 2015-02-17 NOTE — Assessment & Plan Note (Signed)
Are trying to get his feeds up to 5 cans to help stabilize his weight, is tolerating 4 cans daily.

## 2015-02-17 NOTE — Assessment & Plan Note (Signed)
resolved 

## 2015-02-18 DIAGNOSIS — F809 Developmental disorder of speech and language, unspecified: Secondary | ICD-10-CM | POA: Diagnosis not present

## 2015-02-18 DIAGNOSIS — Z5189 Encounter for other specified aftercare: Secondary | ICD-10-CM | POA: Diagnosis not present

## 2015-02-18 DIAGNOSIS — G800 Spastic quadriplegic cerebral palsy: Secondary | ICD-10-CM | POA: Diagnosis not present

## 2015-02-27 ENCOUNTER — Other Ambulatory Visit: Payer: Self-pay | Admitting: Physical Medicine & Rehabilitation

## 2015-02-27 IMAGING — CR DG CHEST 1V
1 series · 1 of 1 positions shown · non-contrast
Comparison: May 26, 2014

CLINICAL DATA: Cough

EXAM:
CHEST - 1 VIEW

[t chest supine]
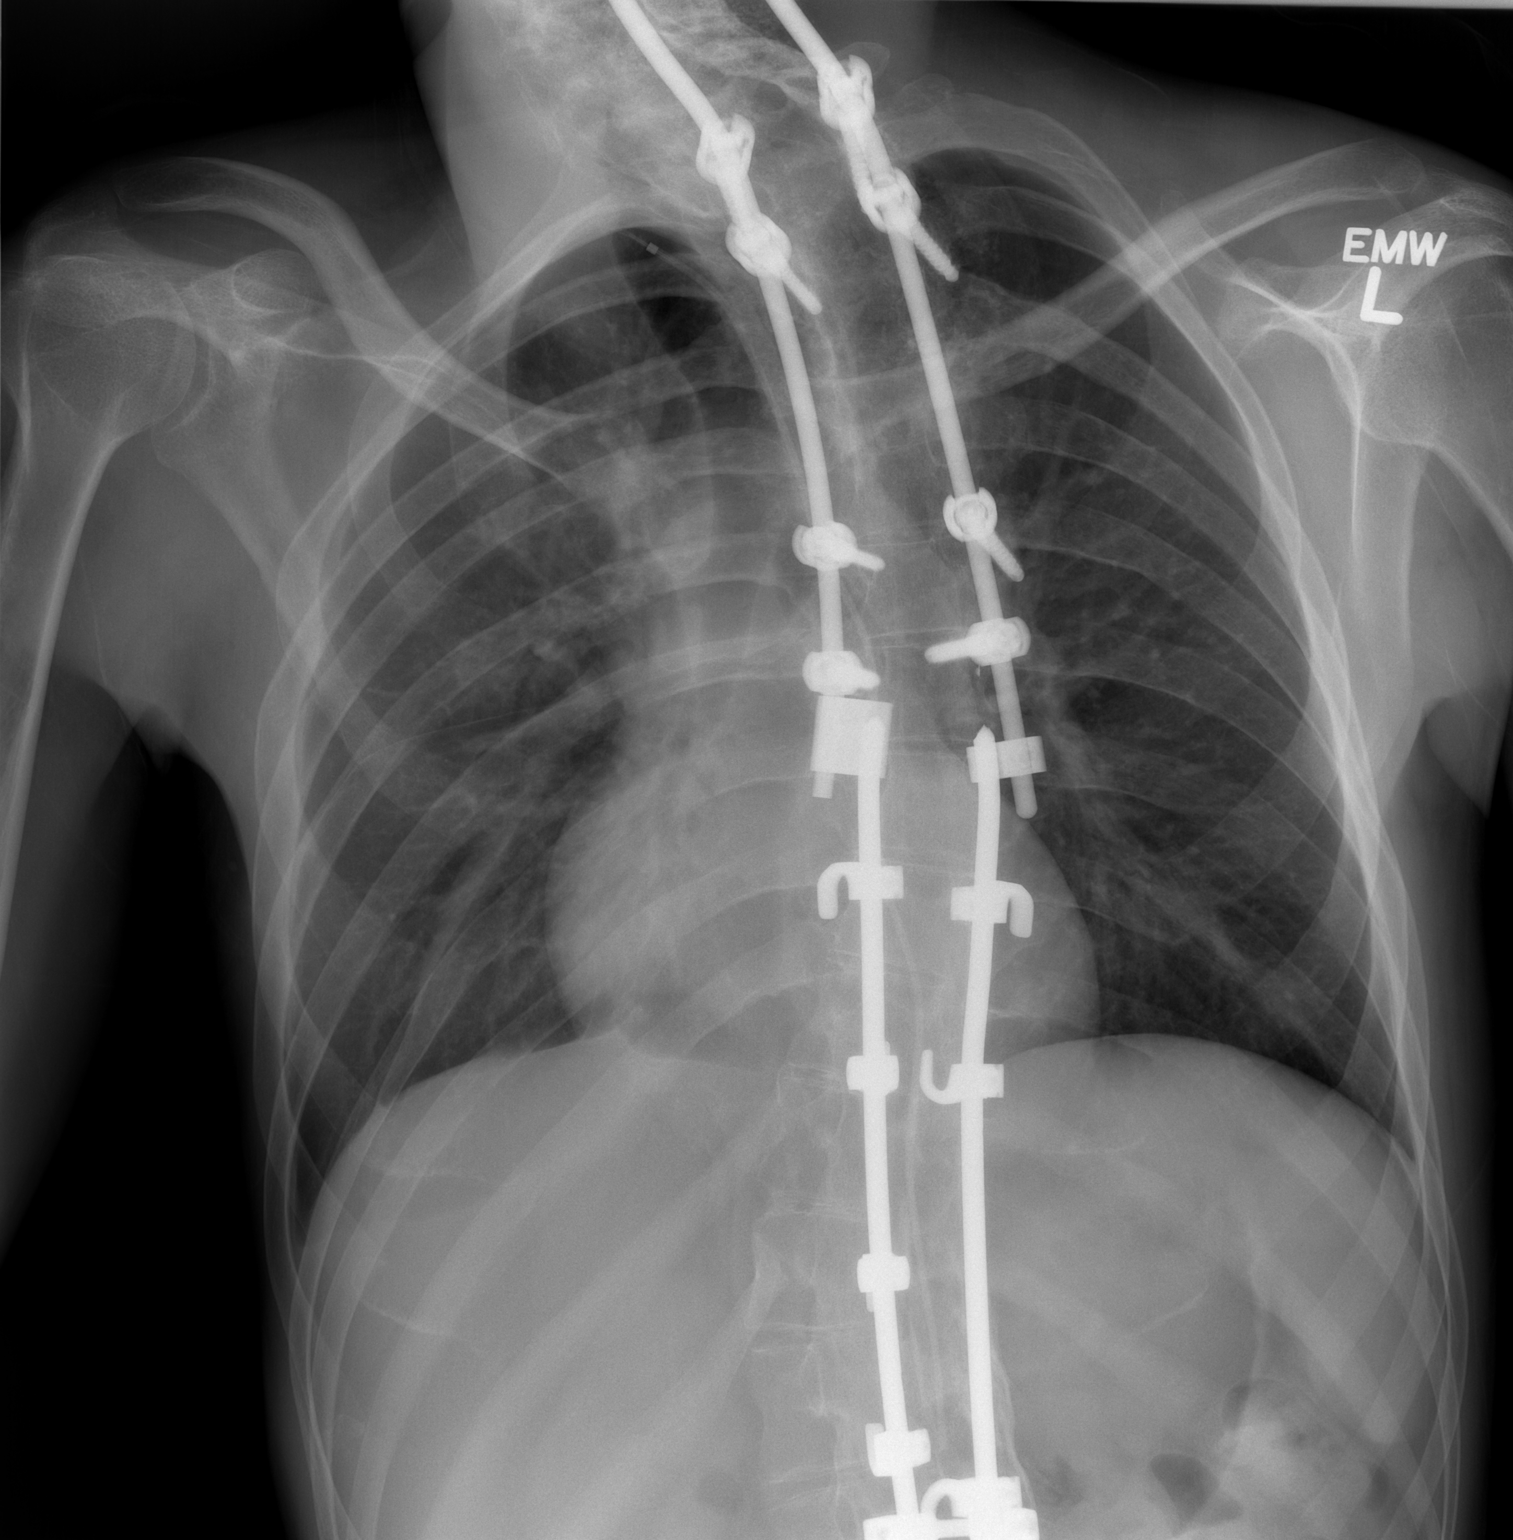

[1 of 1 positions shown; findings below may reference images not displayed]

FINDINGS: The heart size and mediastinal contours are stable. There is no
focal infiltrate, pulmonary edema, or pleural effusion. Spinal rods
are noted. Stable catheter is identified over the spine unchanged.
IMPRESSION: No active cardiopulmonary disease.

## 2015-02-27 IMAGING — CR DG HIP (WITH OR WITHOUT PELVIS) 2-3V*L*
3 series · 3 of 3 positions shown · non-contrast
Comparison: None.

CLINICAL DATA: Left hip pain

EXAM:
LEFT HIP - COMPLETE 2+ VIEW

[t pelvis a.p.]
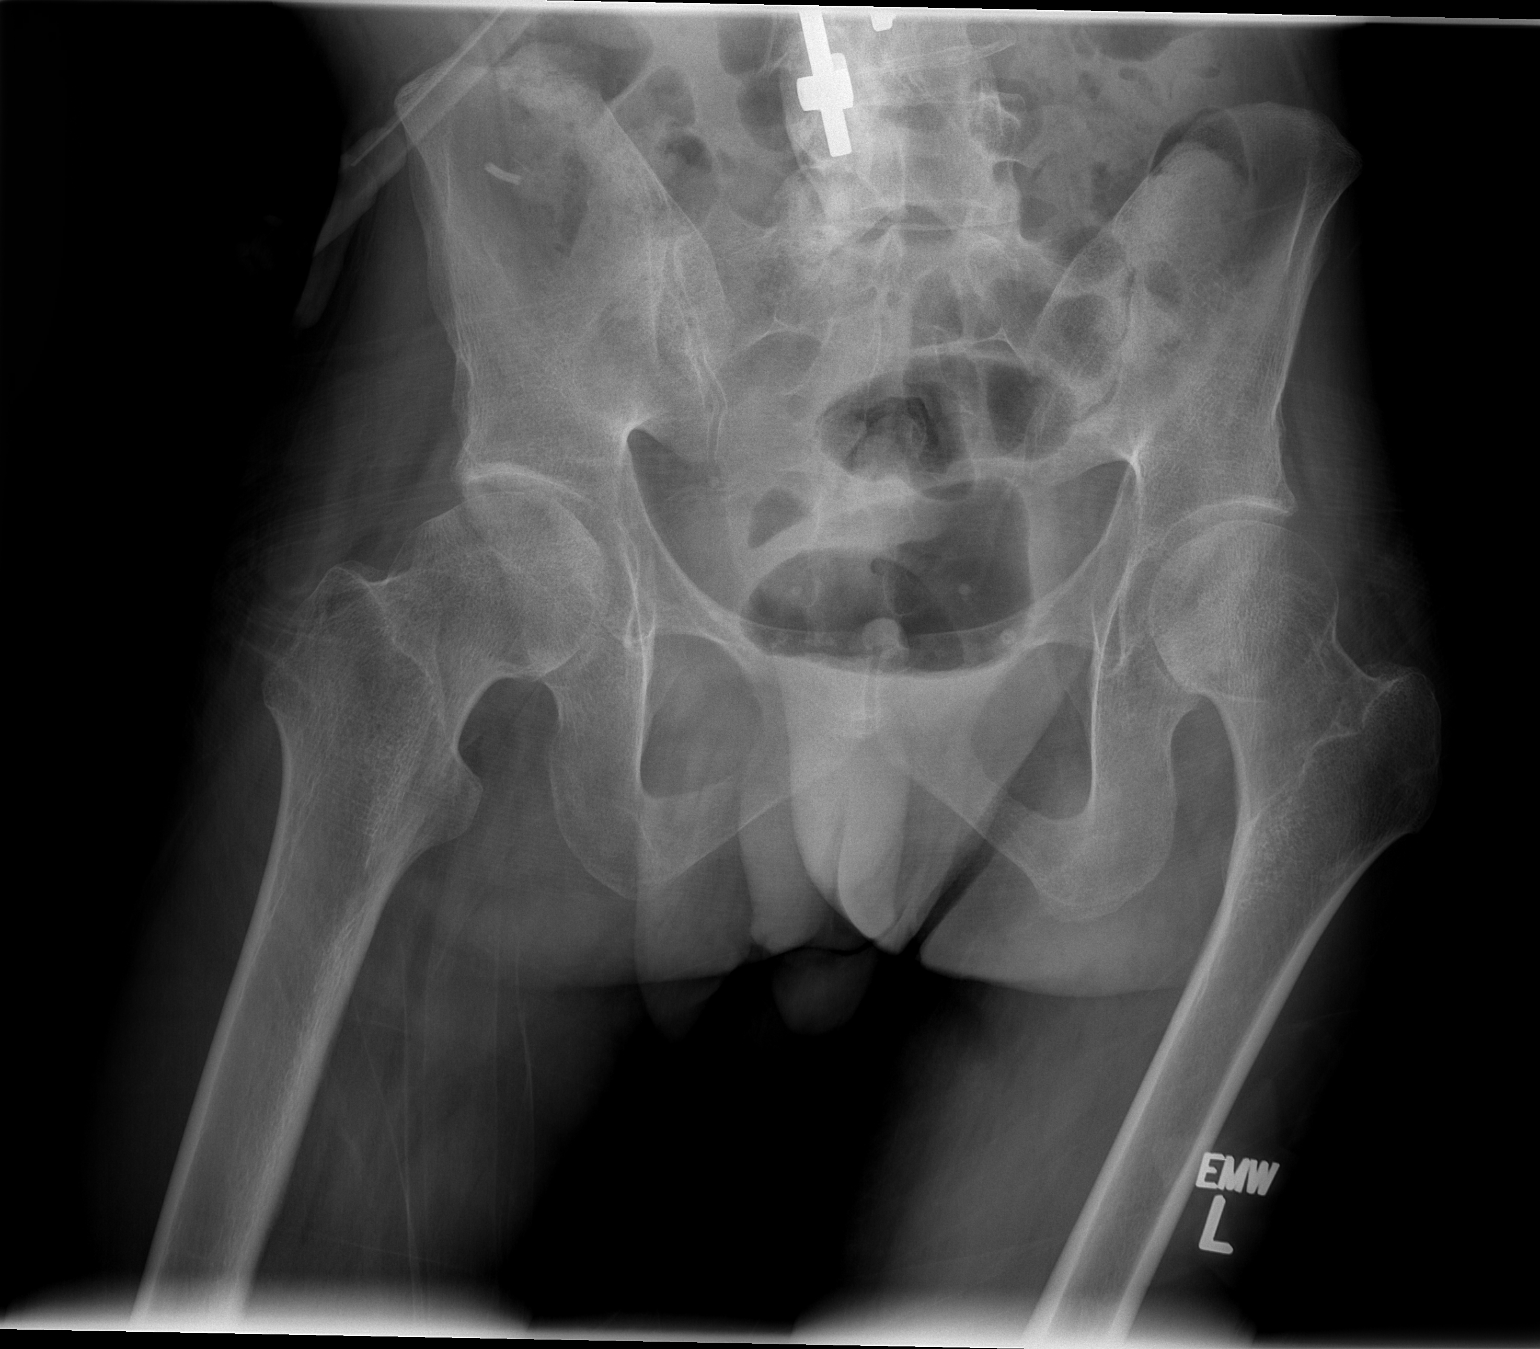

[t hip ap left]
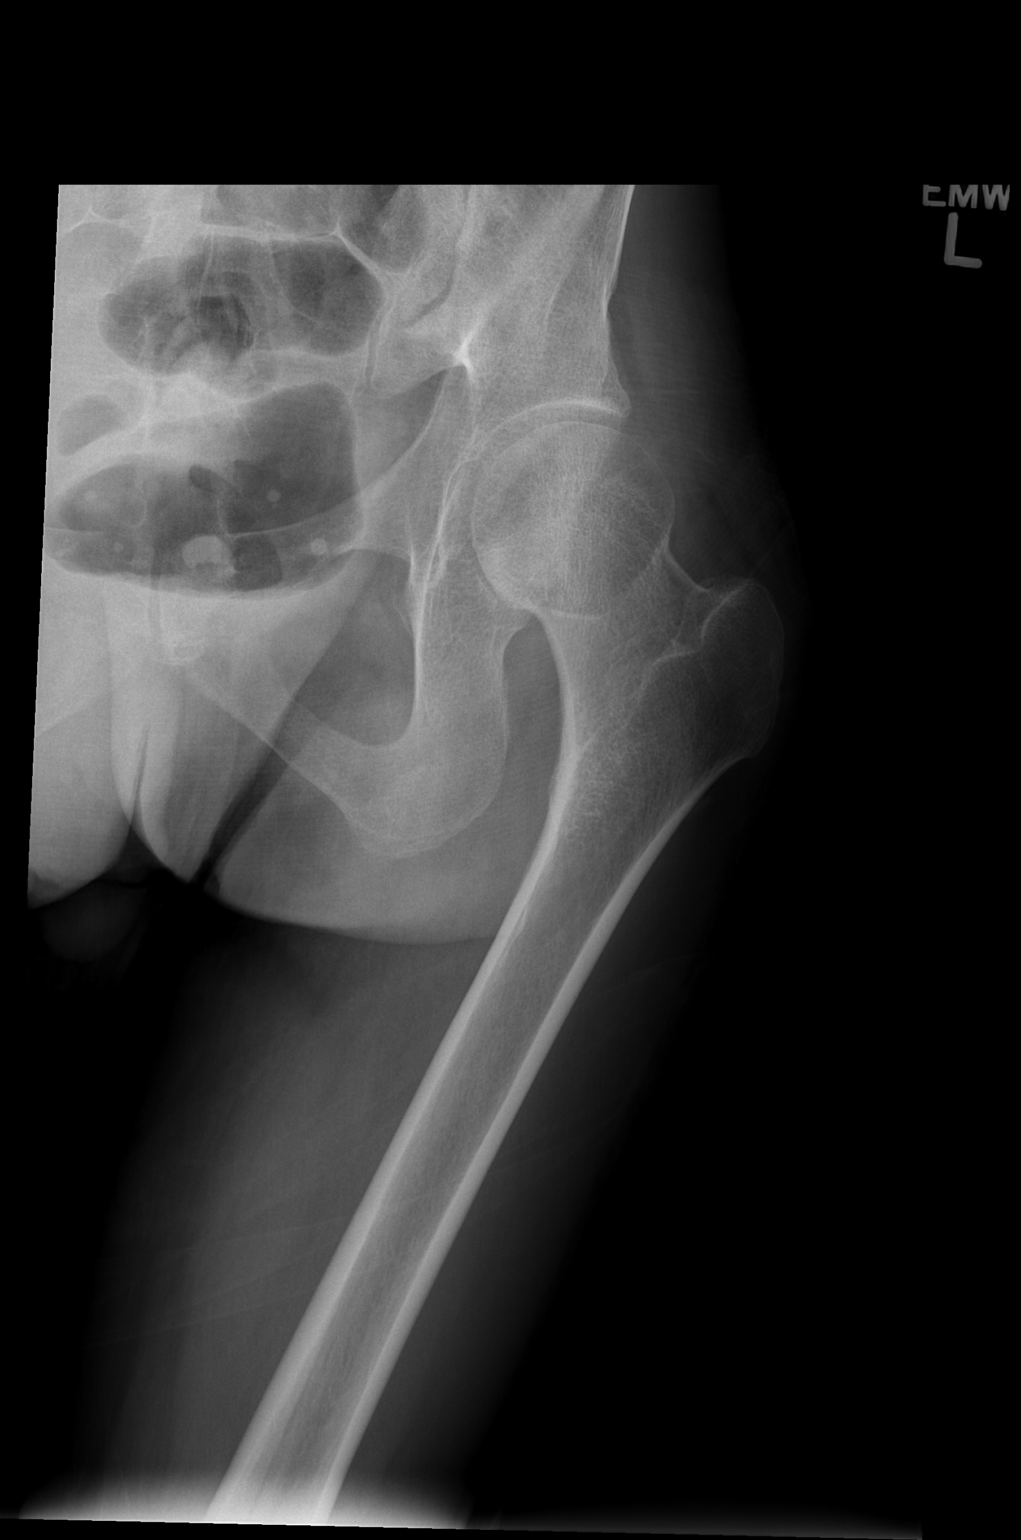

[t hip frog leg left]
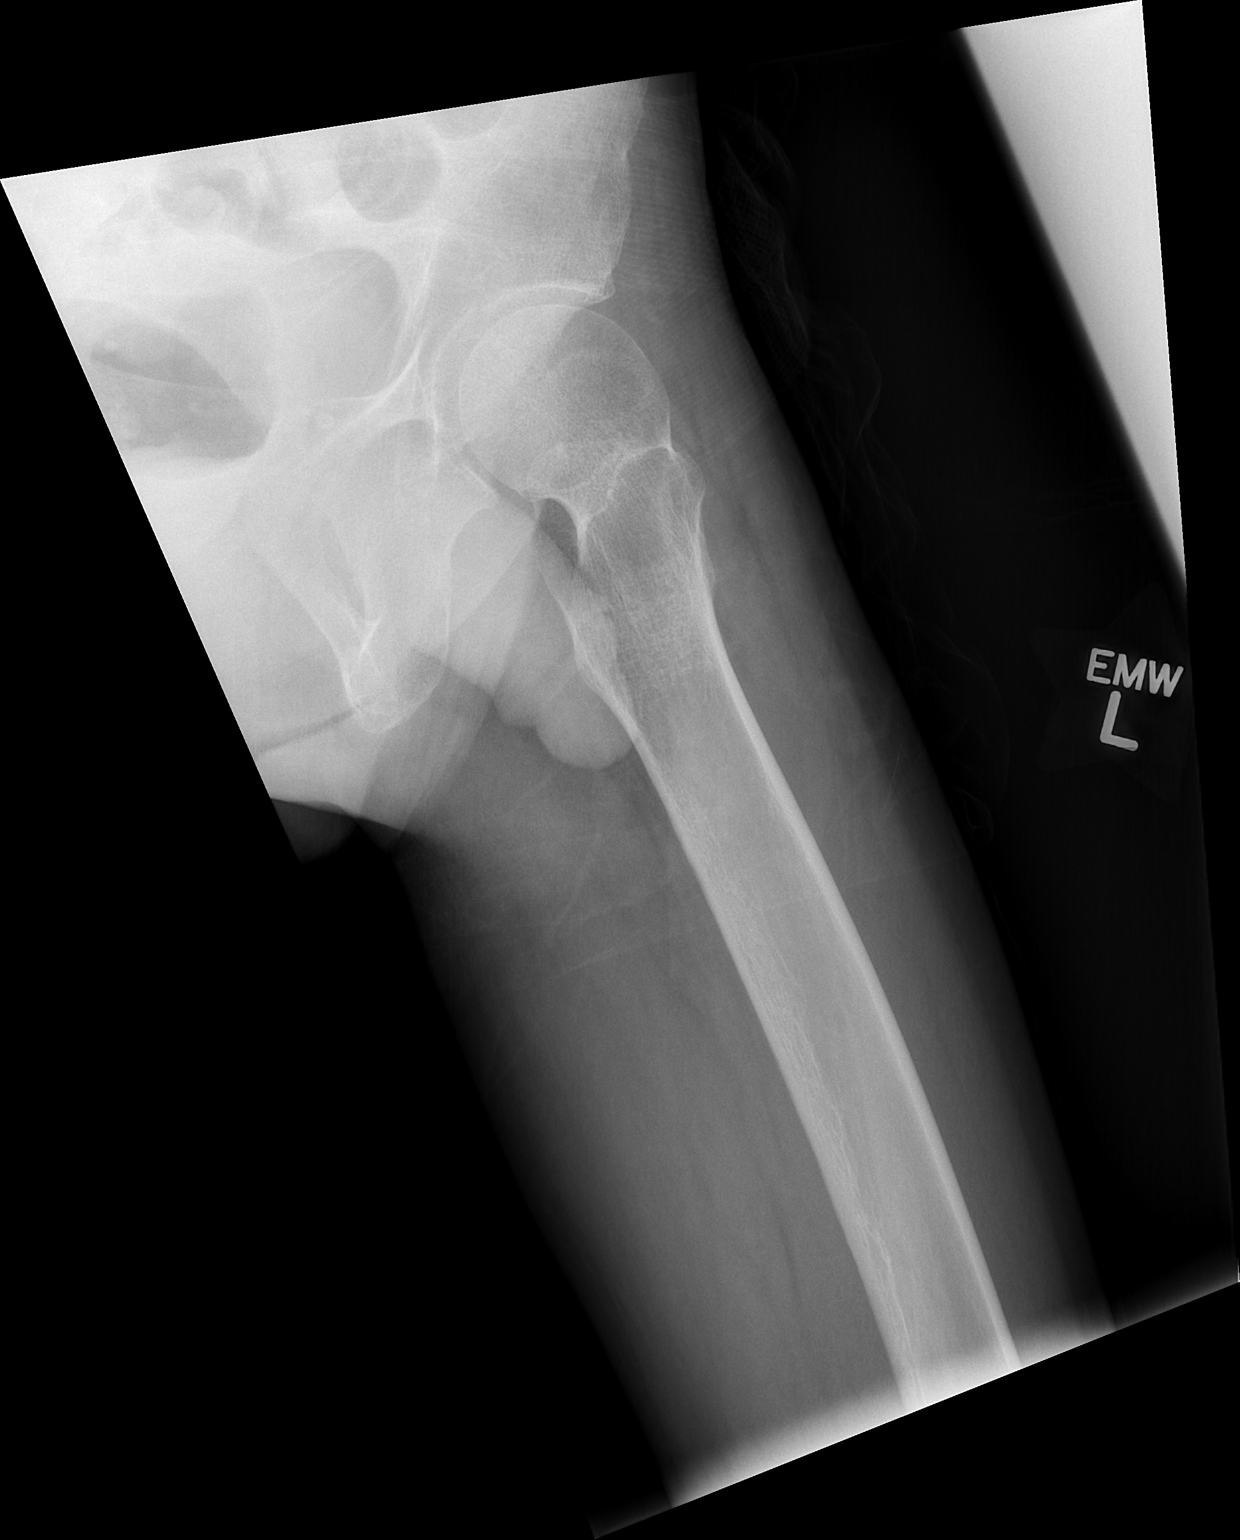

[3 of 3 positions shown; findings below may reference images not displayed]

FINDINGS: There is no evidence of hip fracture or dislocation. Gracile left
femur consistent with patient history. Osseous prominence of the
right superior femoral head neck junction. There is no evidence of
arthropathy or other focal bone abnormality.
IMPRESSION: No acute osseous injury of the left hip.

## 2015-02-27 NOTE — Telephone Encounter (Signed)
Called in RX - Dantrium 50 mg caps, take 1 cap two times daily and Valium 2 mg tablets, take 1-2 tablets at bedtime.

## 2015-03-04 ENCOUNTER — Encounter: Payer: Self-pay | Admitting: Internal Medicine

## 2015-03-11 ENCOUNTER — Encounter: Payer: Self-pay | Admitting: Family Medicine

## 2015-03-13 ENCOUNTER — Encounter: Payer: Self-pay | Admitting: Internal Medicine

## 2015-03-13 ENCOUNTER — Telehealth: Payer: Self-pay

## 2015-03-13 ENCOUNTER — Ambulatory Visit (INDEPENDENT_AMBULATORY_CARE_PROVIDER_SITE_OTHER): Payer: Medicare Other | Admitting: Internal Medicine

## 2015-03-13 VITALS — BP 90/70 | HR 68

## 2015-03-13 DIAGNOSIS — Z8709 Personal history of other diseases of the respiratory system: Secondary | ICD-10-CM

## 2015-03-13 DIAGNOSIS — Z934 Other artificial openings of gastrointestinal tract status: Secondary | ICD-10-CM

## 2015-03-13 DIAGNOSIS — K625 Hemorrhage of anus and rectum: Secondary | ICD-10-CM

## 2015-03-13 DIAGNOSIS — G809 Cerebral palsy, unspecified: Secondary | ICD-10-CM

## 2015-03-13 DIAGNOSIS — R1314 Dysphagia, pharyngoesophageal phase: Secondary | ICD-10-CM

## 2015-03-13 DIAGNOSIS — Z931 Gastrostomy status: Secondary | ICD-10-CM | POA: Diagnosis not present

## 2015-03-13 NOTE — Patient Instructions (Signed)
Please follow up with Dr Hilarie Fredrickson in 6 months.  Please continue to flush J Tube (can continue to use coke to unclog).  Please call our office should rectal bleeding increase.  You will be scheduled for an appointment with a nutritionist. We will contact you regarding this.

## 2015-03-13 NOTE — Telephone Encounter (Signed)
[facility] called and state they received a referral for G-J tube care. [facility] called back and state that they do not have the staff to provide coverage in his zip code area now. Dottie were you working on this?

## 2015-03-13 NOTE — Telephone Encounter (Signed)
Dr Hilarie Fredrickson do you have any further recommendations? [name] is currently using a nutritionist at the location where she purchases patient's supplies. [place] now states they cannot provide services to patient. [facility] cannot assist with any patients on tube feedings. I contacted inpatient nutritionist who does not have any additional suggestions.

## 2015-03-13 NOTE — Progress Notes (Signed)
Subjective:    Patient ID: [name], male    DOB: [birth year], 29 y.o.   MRN: [id number]  HPI [name] is a 29 year old male with history of cerebral palsy, severe scoliosis/kyphosis status post spinal fusions, dysphagia now to feeding dependent with recently placed GJ tube who is seen in follow-up. He is here with his mom. 10 has done well after having the Hemlock tube placed by interventional radiology. He had a G-tube converted to a GJ tube because he continued to have aspiration events and pneumonia. He's had no further pneumonia since getting tube feeds to the J-tube. Mother reports much less cough and gurgling with breathing. They are using 2 cans of Jevity before bed and using overnight pump infusion to instill 3 cans of Jevity. He was having diarrhea when using Osmolite but this has completely resolved with change into feeling formula. He's now having formed stools daily which are easier to pass. There is no urgency giving him time to get him to the toilet. Family reports occasional spots of blood on the stool particularly when the stool is large. This does not seem to cause pain or discomfort.  [name] was able to attend a basketball session last month with [facility] senior star players. He was able to get a [place] autographed and he brings this in today. He also has plans to go to [facility] in July with his mother and father. He is extremely excited about this trip  His mother reports they have had issues with J-tube clogging but they have now realize this is when they forget to flush the tube after tube feeds. They've used Coke to unclog the tube with success. No reported abdominal pain, nausea or vomiting Review of Systems As per HPI, otherwise negative  Current Medications, Allergies, Past Medical History, Past Surgical History, Family History and Social History were reviewed in [employer] record.      Objective:   Physical Exam BP 90/70 mmHg  Pulse 68  Ht   Wt    Constitutional: Thin male sitting in wheelchair no acute distress HEENT: Normocephalic and atraumatic. Oropharynx is clear and moist. No oropharyngeal exudate. Conjunctivae are normal.  No scleral icterus. Neck: Neck supple. Trachea midline. Cardiovascular: Normal rate, regular rhythm and intact distal pulses. Pulmonary/chest: Effort normal and breath sounds normal. No wheezing, rales or rhonchi. Abdominal: Soft, nontender, nondistended. Bowel sounds active throughout. G-tube in place with mild surrounding erythema which is nontender and not firm, bumper turns easily Extremities: no clubbing, cyanosis, or edema Neurological: Alert, makes eye contact, able to verbalize sounds which his mother seems to understand, and tractors in the upper extremities no lower extremity edema Skin: Skin is warm and dry. No rashes noted. Psychiatric: Normal mood and affect. Behavior is normal.      Assessment & Plan:  29 year old male with history of cerebral palsy, severe scoliosis/kyphosis status post spinal fusions, dysphagia now to feeding dependent with recently placed GJ tube who is seen in follow-up.  1. CP with dysphagia likely secondary to spinal surgery to correct kyphosis now on tube feeds -- he is doing well with GJ feedings overall. Less aspiration less cough. We'll continue Jevity but I would like him to be seen by nutrition to be sure he is meeting caloric goal so that he can gain weight appropriately. Finding a nutritionist who will see him has been surprisingly difficult. We will work to arrange this appointment for him. He'll continue Jevity 5 cans daily for now awaiting  nutrition assessment. Tube change outs will be per IR  2. Rectal bleeding -- flexible sigmoidoscopy in October on remarkable. Possibly internal hemorrhoid intermittent or even fissure. Rare no further evaluation at this time unless worsening or more frequent.  Six-month follow-up, sooner if necessary 25 minutes spent with  patient and mother today

## 2015-03-13 NOTE — Telephone Encounter (Signed)
Spoke with patient mother about [facility] coming out to help with J tube.  According the [facility] they can send someone out to evaluate the patient and see if they can help him.  Patient mother [name] said it is ok to do so, even though she had problems with this in the past.  Order placed and faxed to [facility] they will contact patient.

## 2015-03-14 NOTE — Telephone Encounter (Signed)
Spoke to inpatient dietitian today who referred me to [facility] dietitian I spoke to several people, lastly Thersa Salt who is a Firefighter with [facility]. It sounds like we need to determine from whom [name] is receiving his tube feeding formula and supplies. If he gets supplies from [facility] than the dietitian can see him. Otherwise possibly the service providing formula will have a dietitian Phone number for Thersa Salt is [phone] extension [phone] I tried [name]'s mom, [name], 2 times but got voicemail. Please try to contact her to determine where his formula and supplies are coming from and maybe this will lead to a dietitian

## 2015-03-14 NOTE — Telephone Encounter (Signed)
Patient gets supplies from [facility] (phone [phone]). Per Ray, patients mother already consults with a dietitian Comrie Hummingbird and Baxter Flattery) every 2 months. Ray states that he will send correspondence to one of the dieticians that Ms. Lesko already communicates with to let them know that we would like their input on whether patient's caloric needs are being taken care of with 5 cans of Jevity daily. We have already left a message for Ms.Lagares to call back at which time I will advise her of the above.

## 2015-03-15 NOTE — Telephone Encounter (Signed)
Forms completed by Dr. Charlett Blake. Copy made and sent to scanning. Called and informed pt's mother, [name], that forms are ready for pick up at our front desk. She states that [name] and his nursing aide will be by today to pick them up. She was very pleased that the forms were completed so quickly. JG//CMA

## 2015-04-09 IMAGING — RF DG SWALLOWING FUNCTION
2 series · 2 of 2 positions shown · non-contrast
Comparison: Chest radiographs 07/24/2014 and earlier.

CLINICAL DATA: 28-year-old male with cerebral palsy and dysphagia.
Initial encounter.

EXAM:
MODIFIED BARIUM SWALLOW
TECHNIQUE: Different consistencies of barium were administered orally to the
patient by the Speech Pathologist. Imaging of the pharynx was
performed in the lateral projection.
FLUOROSCOPY TIME:  2 min 37 seconds

[Series 1: run · 1 of 1 slices shown (1 of 2)]
[im 1/1]
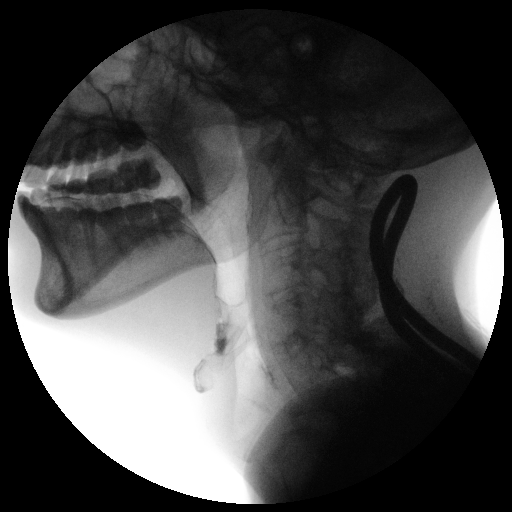

[Series 1: run · 1 of 1 slices shown (2 of 2)]
[im 1/1]
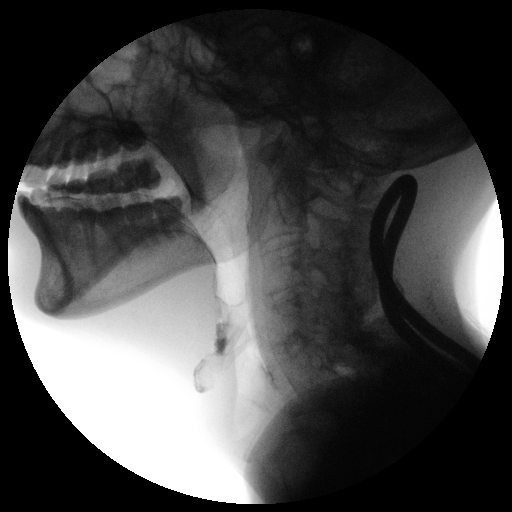

[2 of 2 positions shown; findings below may reference images not displayed]

FINDINGS: Thin liquid- not attempted

Nectar thick liquid- aspiration (with the larger of two spoonfuls)
which did not elicit spontaneous coughing. Also directed cough then
did not significantly clear the residual.

Honey- not attempted

Mihkel-Matteus?Canel (pudding consistency)- penetration, and adherence of barium
along the dorsal surface of the epiglottis which was poorly cleared
with additional swallows and coughing. Residual, poorly cleared.

Cracker-not attempted

Mihkel-Matteus?Canel with cracker- not attempted

Barium tablet -  not attempted
IMPRESSION: Penetration and silent aspiration demonstrated.

Please refer to the Speech Pathologists report for complete details
and recommendations.

## 2015-04-11 IMAGING — CR DG CHEST 2V
1 series · 1 of 1 positions shown · non-contrast
Comparison: Report of the barium swallow examination September 03, 2014

CLINICAL DATA: Cough for 3 days ; mother states onset following
barium swallow examination on [REDACTED]

EXAM:
CHEST  2 VIEW

[view not recorded]
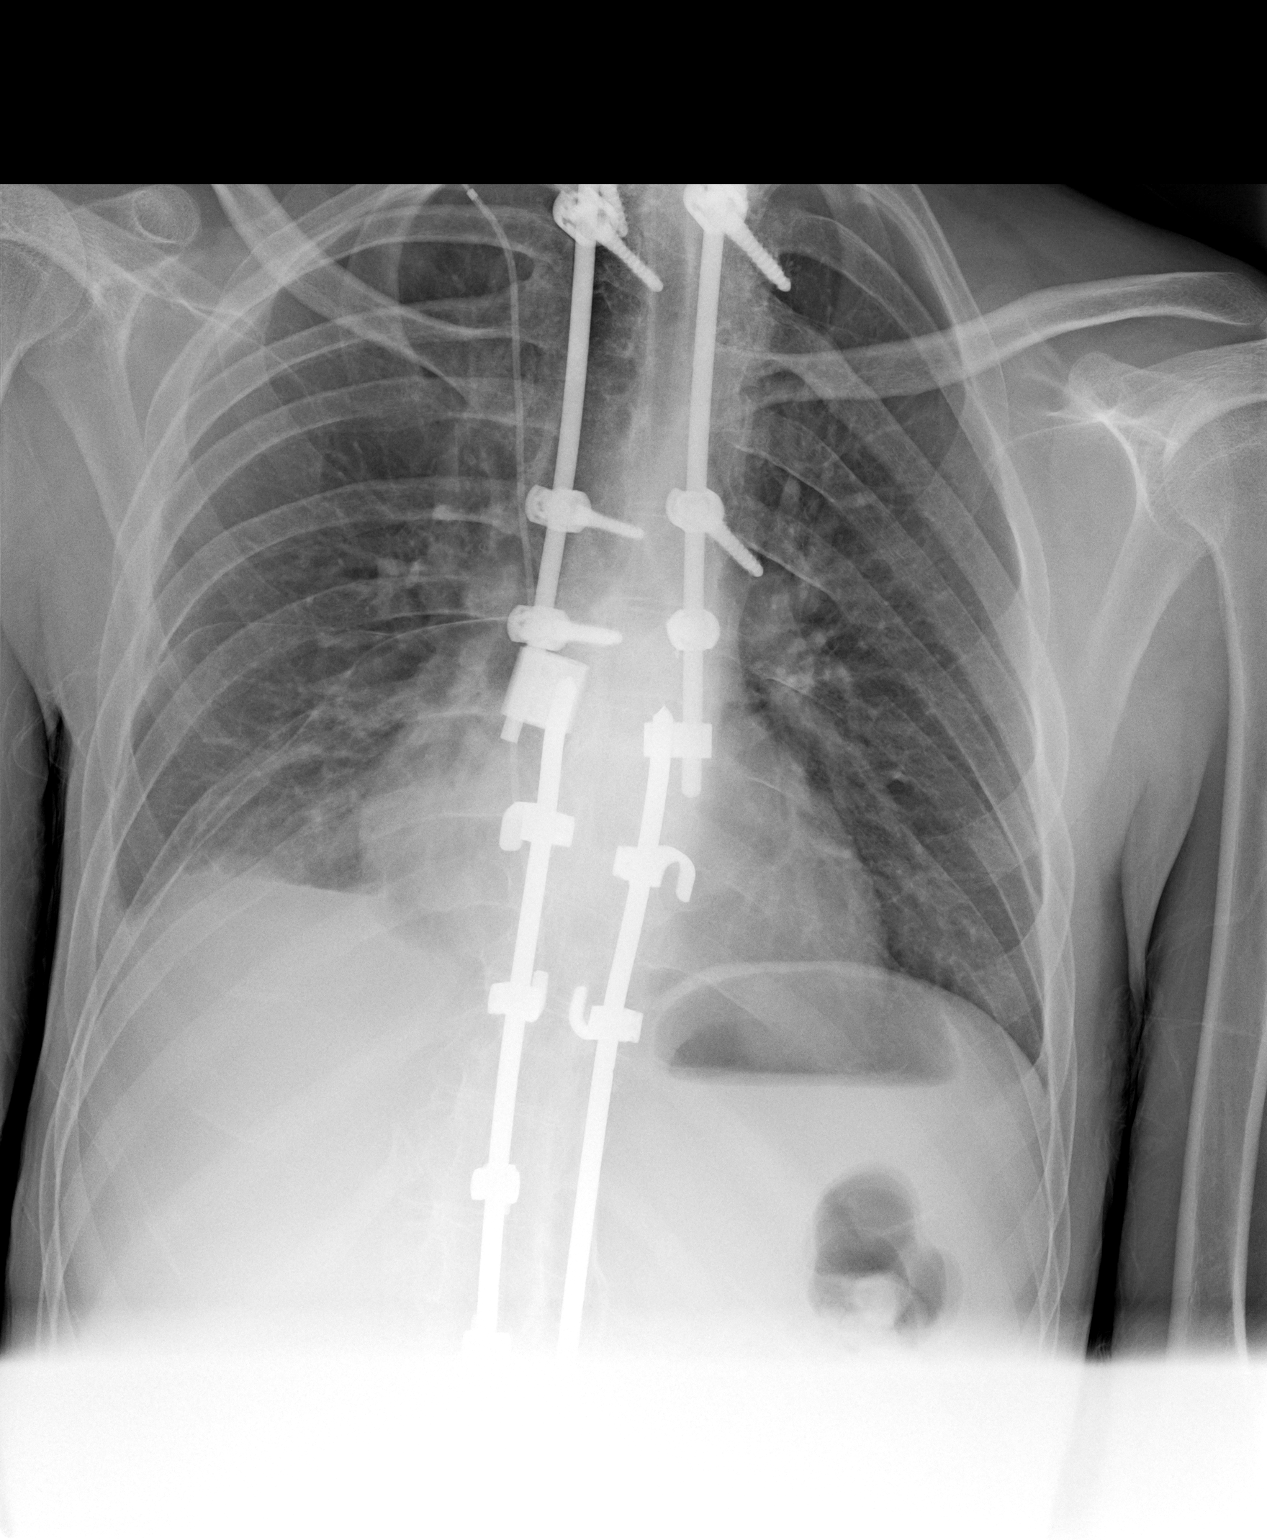

[1 of 1 positions shown; findings below may reference images not displayed]

FINDINGS: The lungs are adequately inflated. No barium is demonstrated in the
tracheal or bronchial passages. There is small right pleural
effusion. There is right basilar pneumonia posteriorly. The heart
and pulmonary vascularity are normal. There is a tubular structure
which extends from the level of the right pulmonary apex inferiorly
to the thoracolumbar junction. There are posterior fusion devices
and Harrington rods from the cervical through the visualized
portions of the lumbar spine.
IMPRESSION: There is right lower lobe pneumonia with small right pleural
effusion. No radiodense barium is demonstrated.

## 2015-04-12 IMAGING — CT CT HEAD W/O CM
1 of 2 series · 13 of 30 positions shown, 17 images · non-contrast
Comparison: CT scan of November 29, 2013.

CLINICAL DATA: Dysphagia.  Cerebral palsy.

EXAM:
CT HEAD WITHOUT CONTRAST
TECHNIQUE: Contiguous axial images were obtained from the base of the skull
through the vertex without intravenous contrast.

[Series 2: head 5.0 h30s · axial · 0.46mm/px · z∈[-142,-2]mm · 13 of 34 slices shown, 17 images]
[im 3/34  brain]
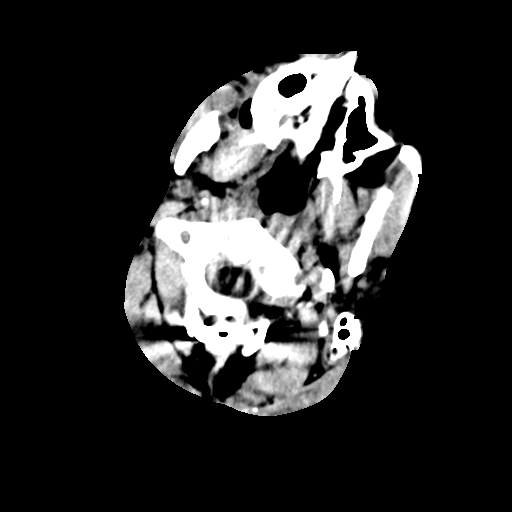
[im 3/34  bone]
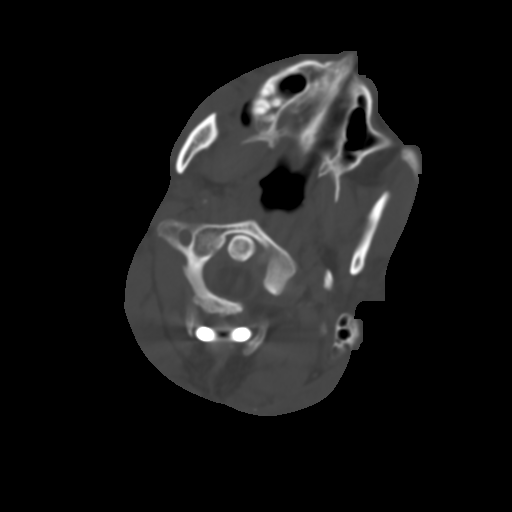
[im 5/34  brain]
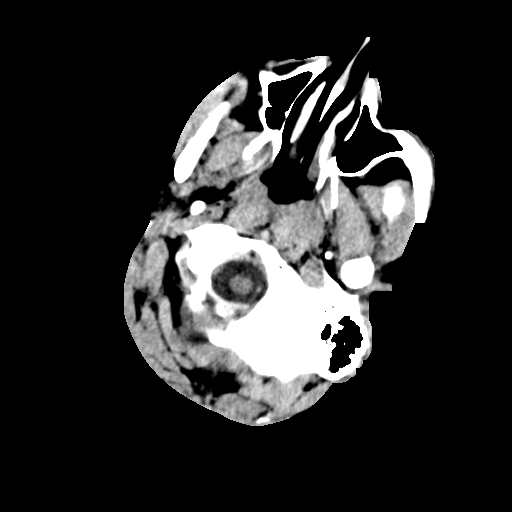
[im 8/34  brain]
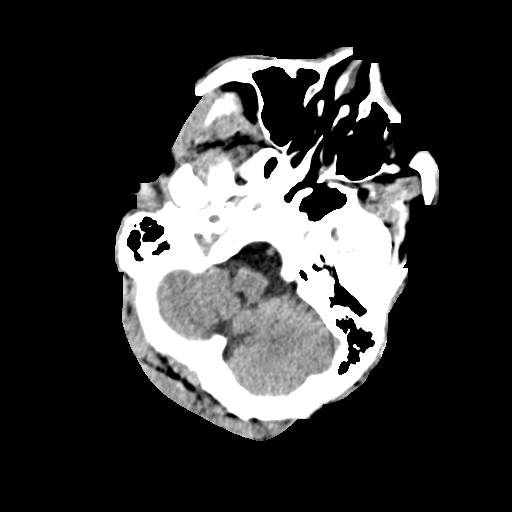
[im 10/34  brain]
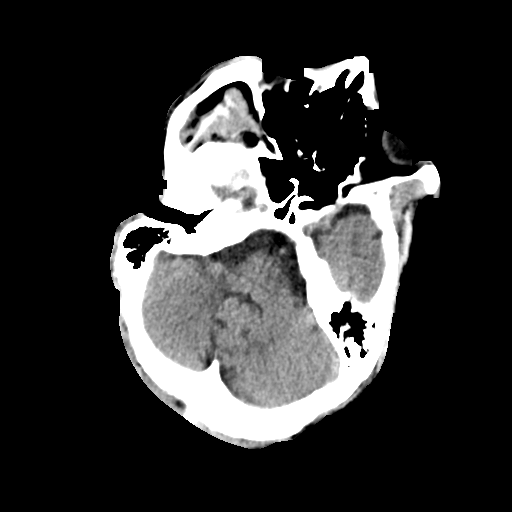
[im 12/34  brain]
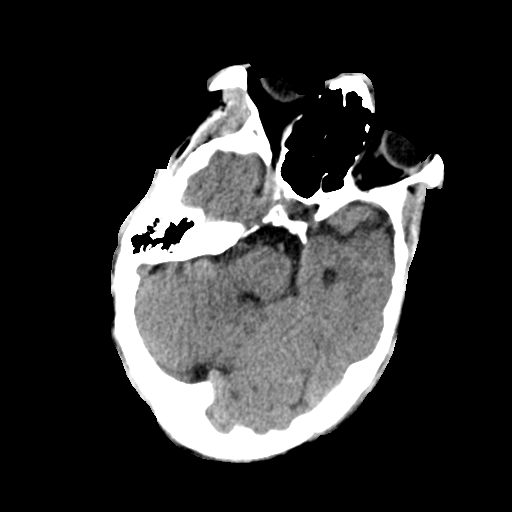
[im 12/34  bone]
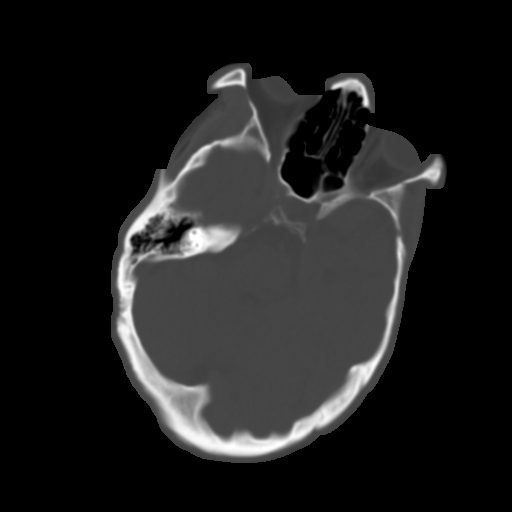
[im 15/34  brain]
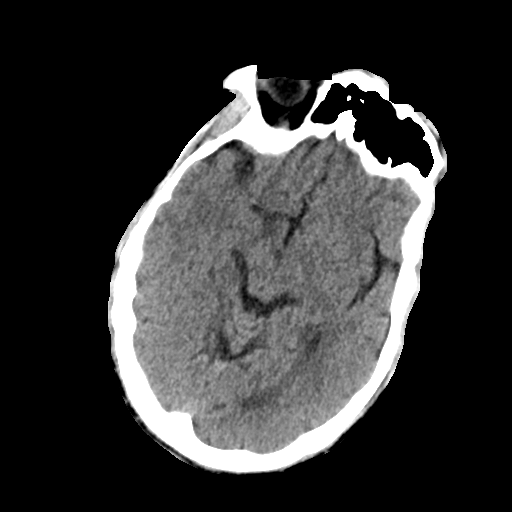
[im 17/34  brain]
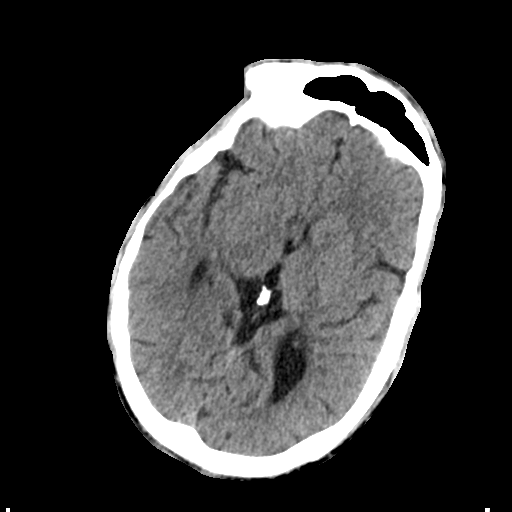
[im 19/34  brain]
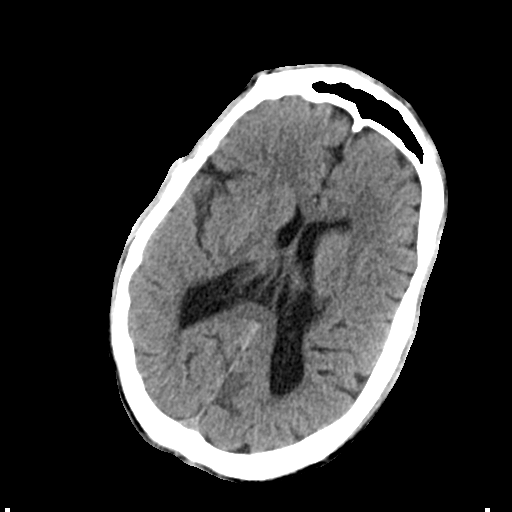
[im 22/34  brain]
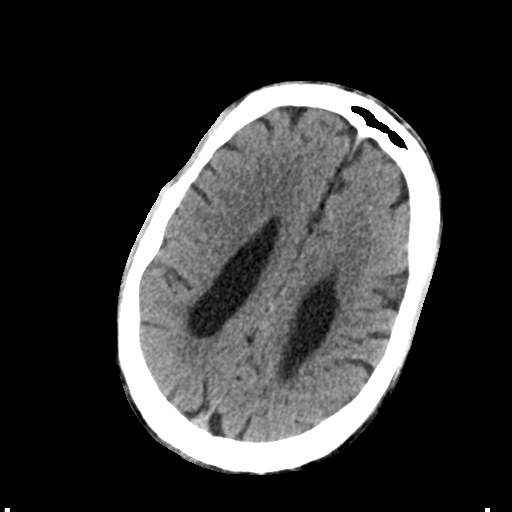
[im 22/34  bone]
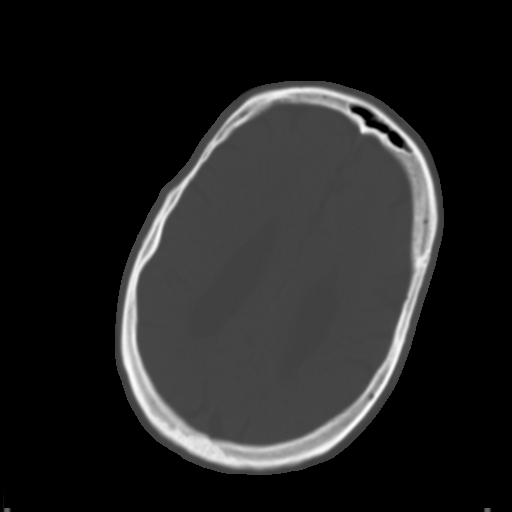
[im 24/34  brain]
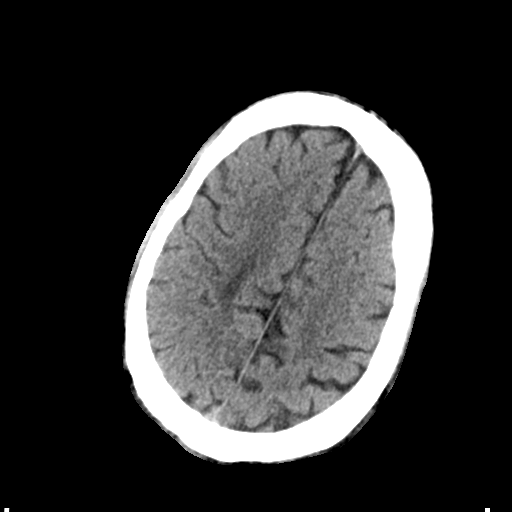
[im 26/34  brain]
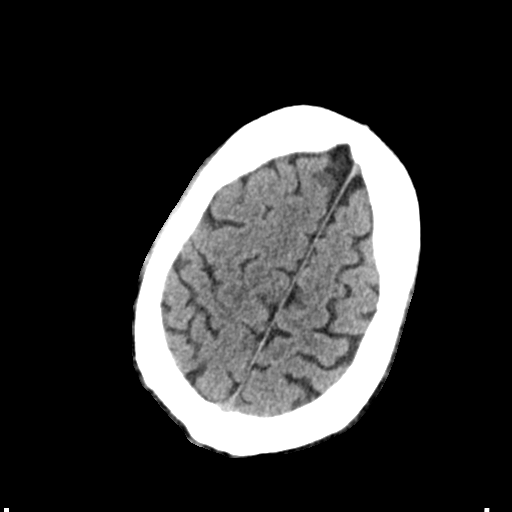
[im 29/34  brain]
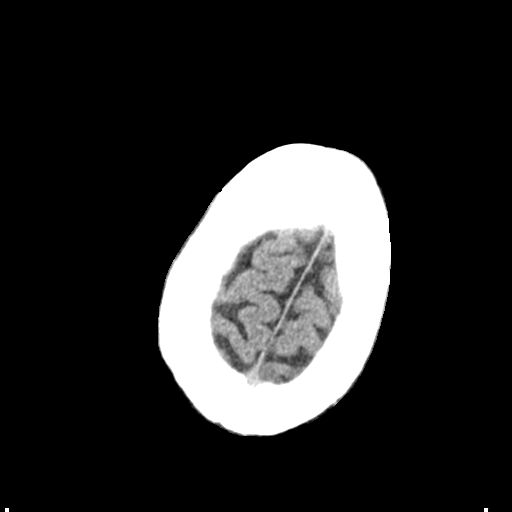
[im 31/34  brain]
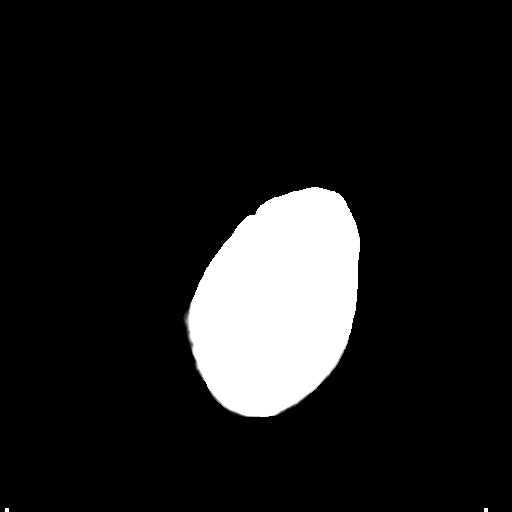
[im 31/34  bone]
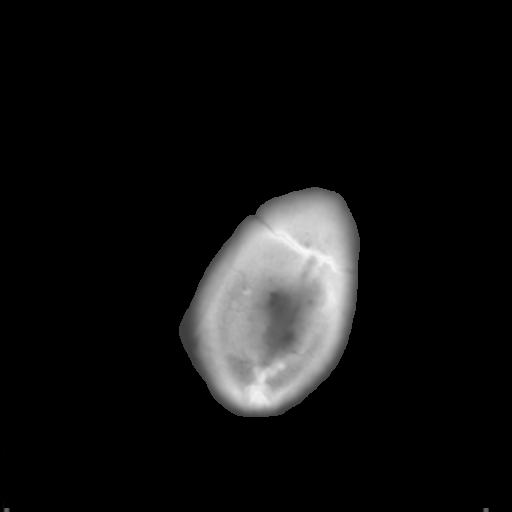

[13 of 30 positions shown; findings below may reference images not displayed]

FINDINGS: Bony calvarium appears intact. Stable congenital deformity of the
frontal lobes is noted. No mass effect or midline shift is noted.
Ventricular size is within normal limits. There is no evidence of
mass lesion, hemorrhage or acute infarction.
IMPRESSION: No acute intracranial abnormality seen.

## 2015-04-12 IMAGING — CT CT NECK W/ CM
1 of 2 series · 9 of 14 positions shown, 12 images · IV contrast (Omni 300)
Comparison: Cervical spine CT 11/24/2007

CLINICAL DATA: Dysphagia.  Aspiration pneumonia.  Cerebral palsy.

EXAM:
CT NECK WITH CONTRAST
TECHNIQUE: Multidetector CT imaging of the neck was performed using the
standard protocol following the bolus administration of intravenous
contrast.
CONTRAST:  100mL OMNIPAQUE IOHEXOL 300 MG/ML  SOLN

[Series 16: neck thins · axial · 0.61mm/px · z∈[-309,-125]mm · 9 of 458 slices shown, 12 images]
[im 46/458  soft-tissue]
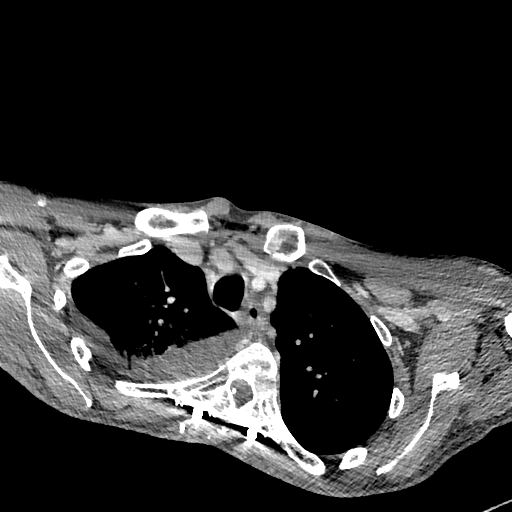
[im 46/458  bone]
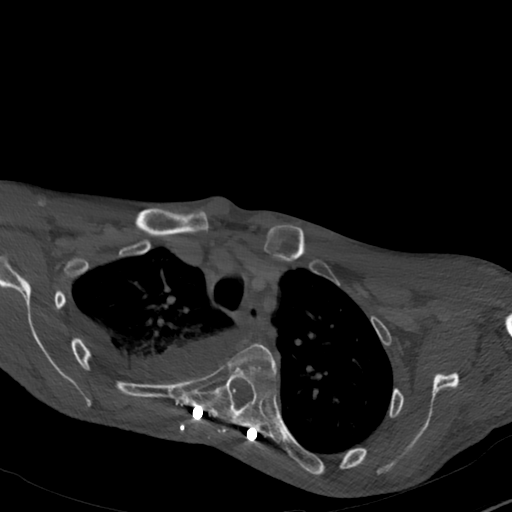
[im 92/458  bone]
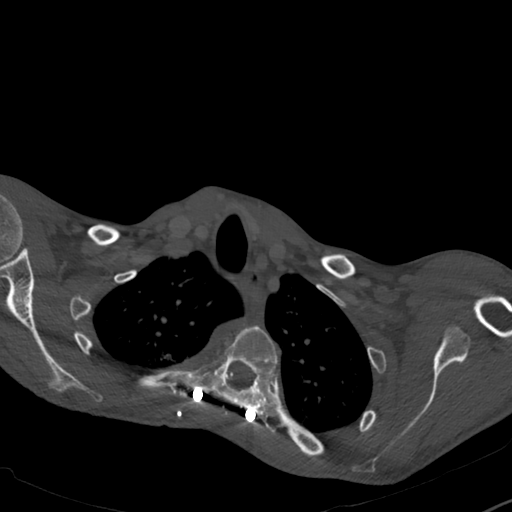
[im 138/458  bone]
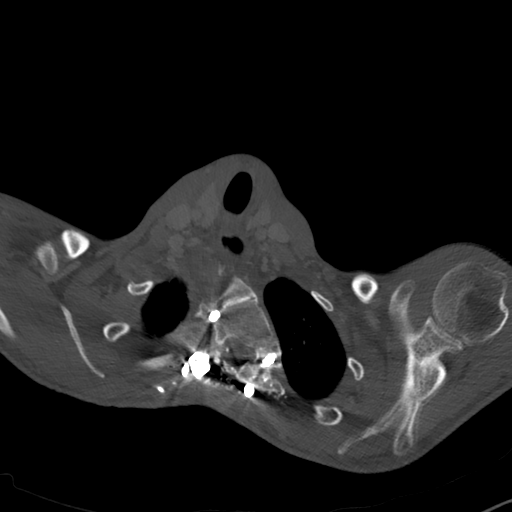
[im 183/458  bone]
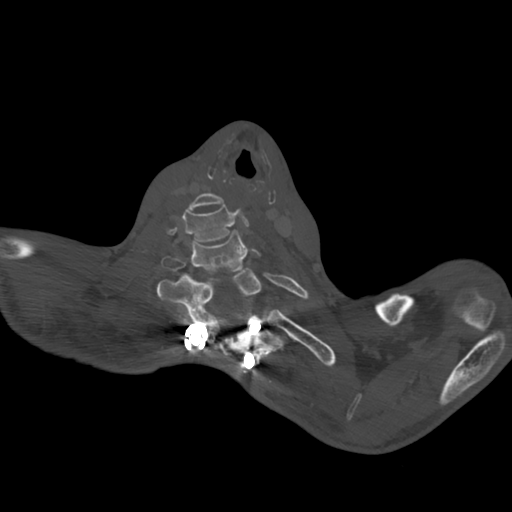
[im 229/458  soft-tissue]
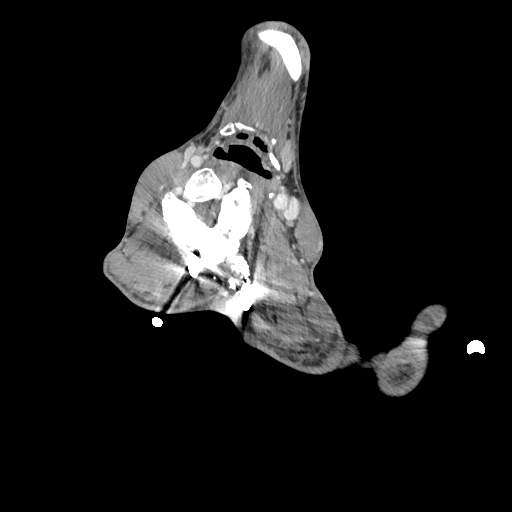
[im 229/458  bone]
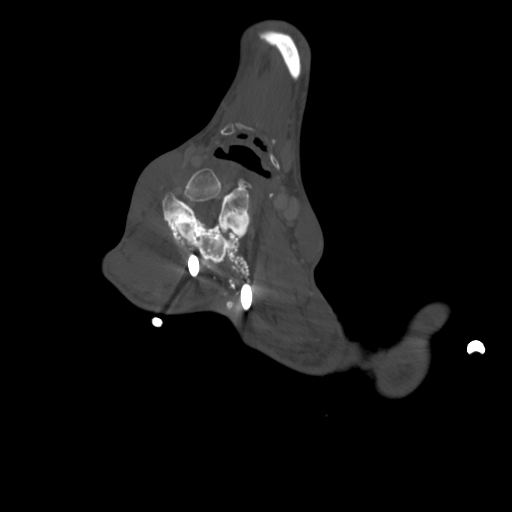
[im 275/458  bone]
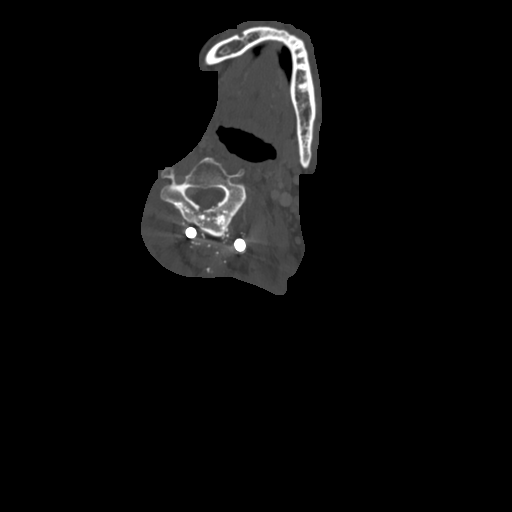
[im 320/458  bone]
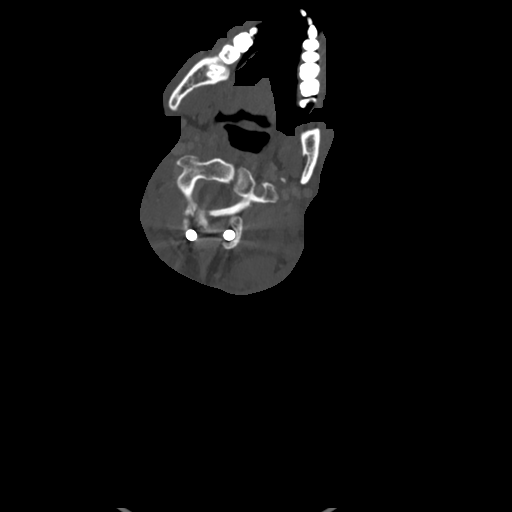
[im 366/458  bone]
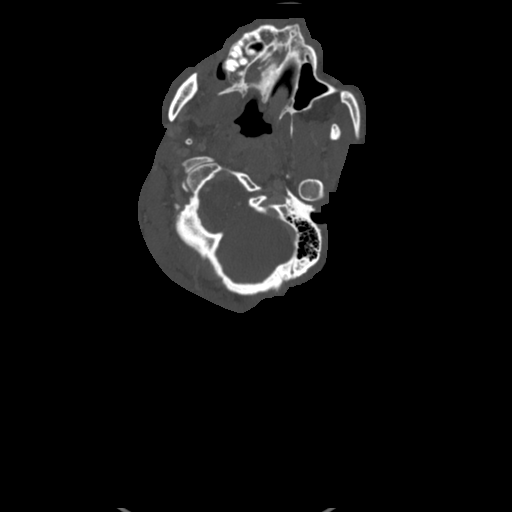
[im 412/458  soft-tissue]
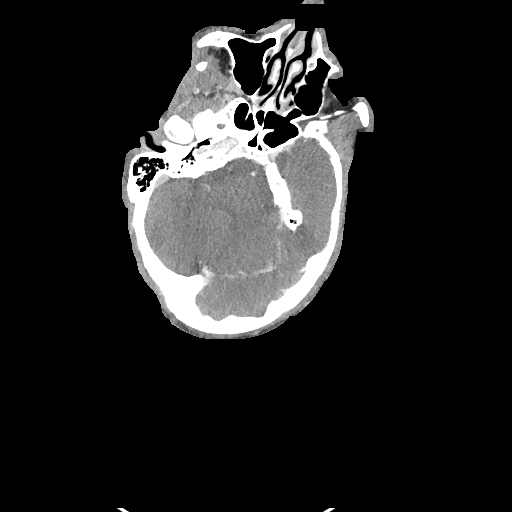
[im 412/458  bone]
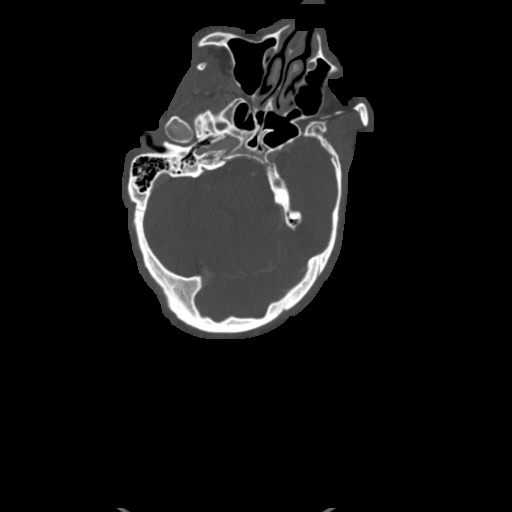

[9 of 14 positions shown; findings below may reference images not displayed]

FINDINGS: The visualized portions of the brain and orbits are unremarkable.
Visualized paranasal sinuses and mastoid air cells are clear.

The nasopharynx, oropharynx, oral cavity, and larynx are
unremarkable. The thyroid, submandibular, and parotid glands are
unremarkable.

No enlarged lymph nodes are identified in the neck. Subcentimeter
left level II lymph nodes are mildly more prominent than those on
the right but are likely reactive. Major vascular structures of the
neck appear patent.

Moderate right pleural effusion is partially visualized. Posterior
fusion hardware is partially visualized in the cervical and upper
thoracic spine. The left T3 screw projects into the medial aspect of
the left lung apex lateral to the vertebral body.
IMPRESSION: 1. No mass or acute abnormality identified in the neck.
2. Moderate right pleural effusion.

## 2015-04-12 IMAGING — CT CT ABD-PELV W/ CM
2 of 5 series · 16 of 46 positions shown, 18 images · IV contrast (omnipaque)
Comparison: 11/23/2013

CLINICAL DATA: Intermittent rectal bleeding for 3 days. History of
cerebral palsy.

EXAM:
CT ABDOMEN AND PELVIS WITH CONTRAST
TECHNIQUE: Multidetector CT imaging of the abdomen and pelvis was performed
using the standard protocol following bolus administration of
intravenous contrast.
CONTRAST:  100mL OMNIPAQUE IOHEXOL 300 MG/ML  SOLN

[Series 3: abd/ pelvis 5.0 i30f 1 · axial · 0.70mm/px · z∈[-857,-427]mm · 13 of 98 slices shown, 15 images]
[im 6/98  soft-tissue]
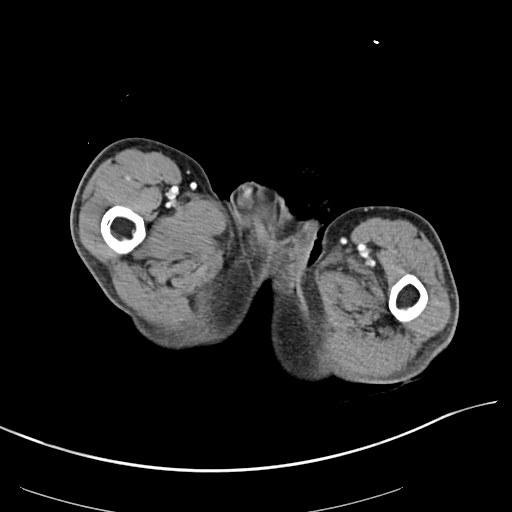
[im 6/98  bone]
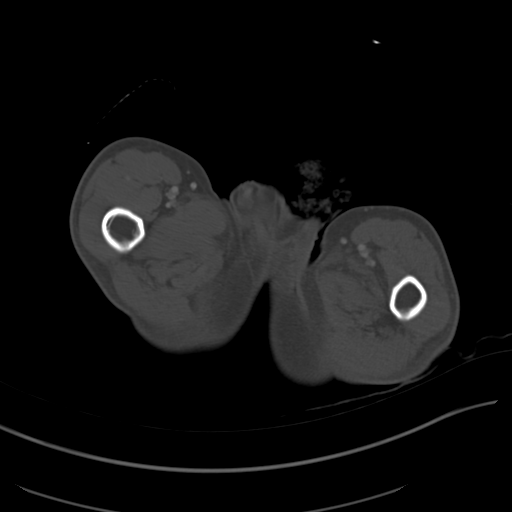
[im 11/98  soft-tissue]
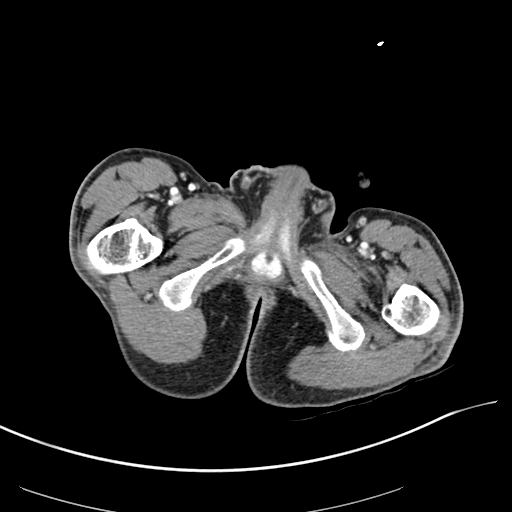
[im 22/98  soft-tissue]
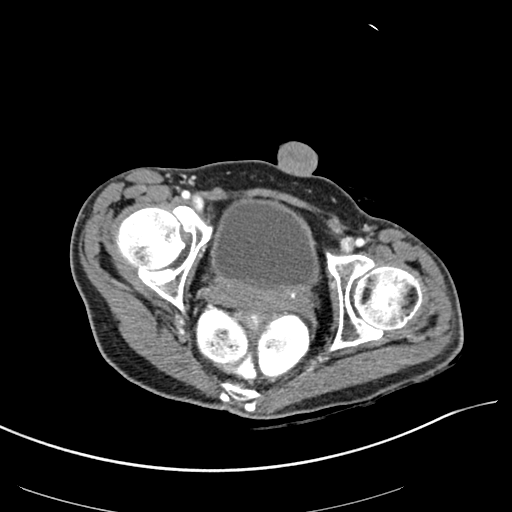
[im 27/98  soft-tissue]
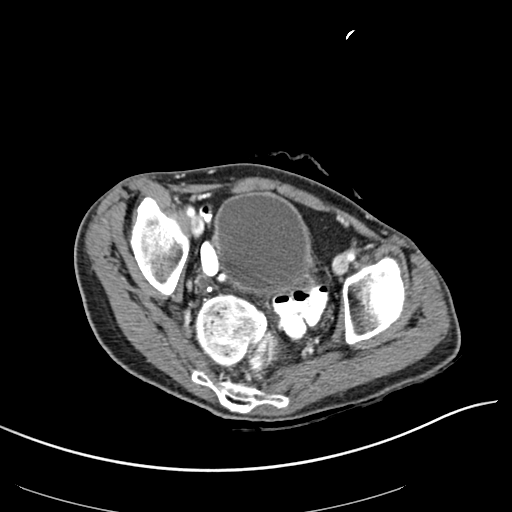
[im 33/98  soft-tissue]
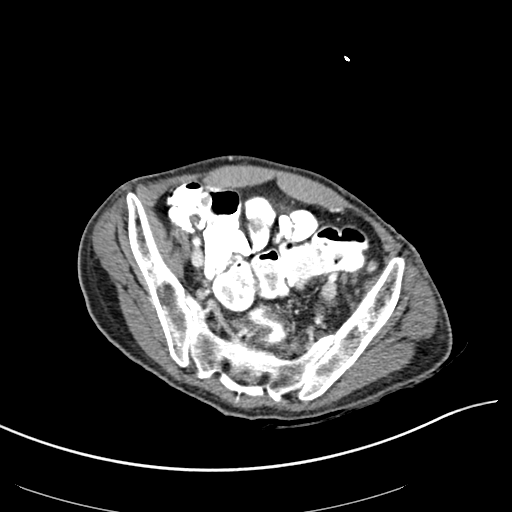
[im 44/98  soft-tissue]
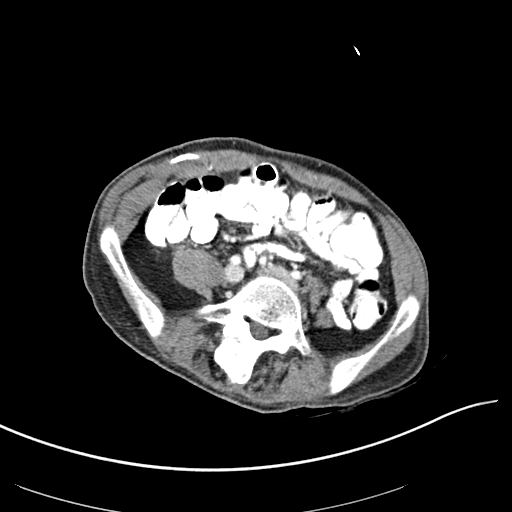
[im 49/98  soft-tissue]
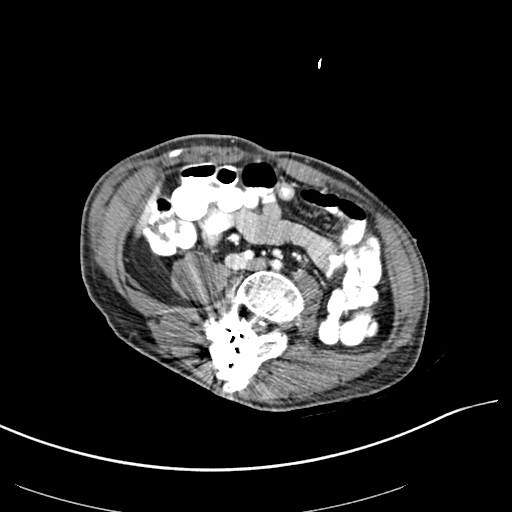
[im 54/98  soft-tissue]
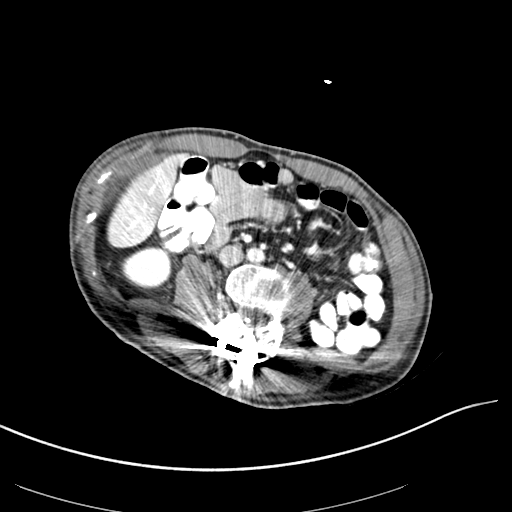
[im 65/98  soft-tissue]
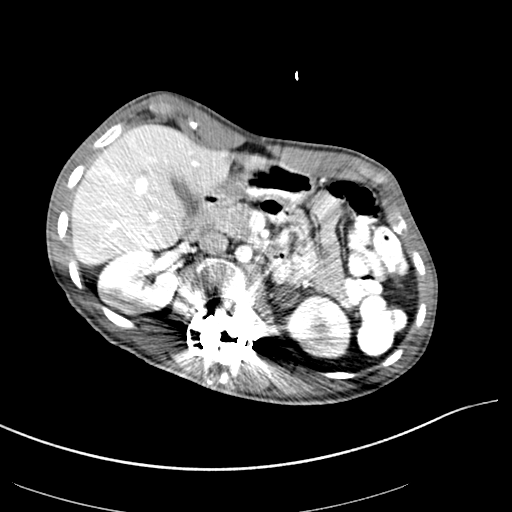
[im 65/98  bone]
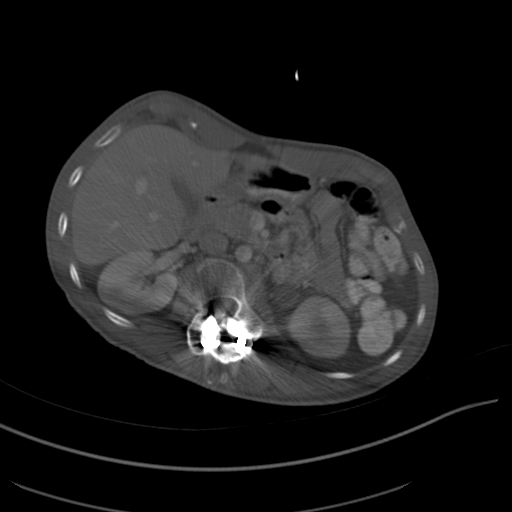
[im 71/98  soft-tissue]
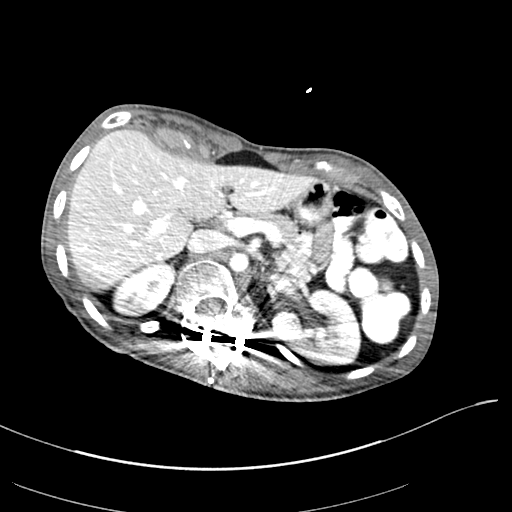
[im 76/98  soft-tissue]
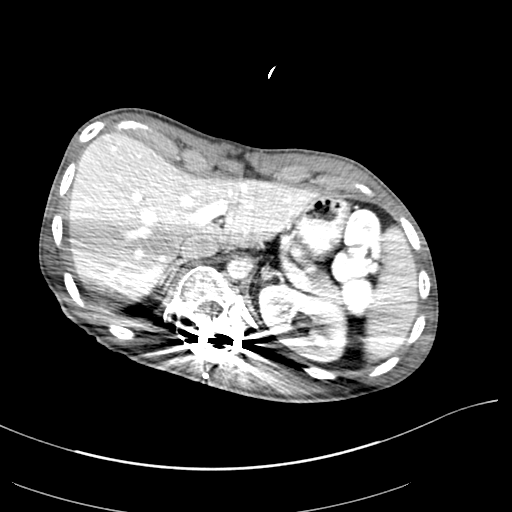
[im 87/98  soft-tissue]
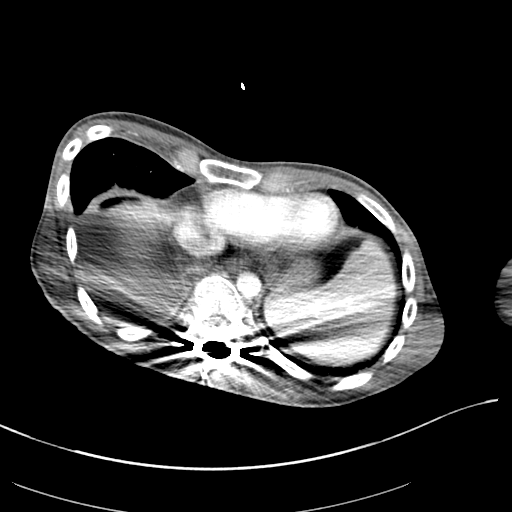
[im 92/98  soft-tissue]
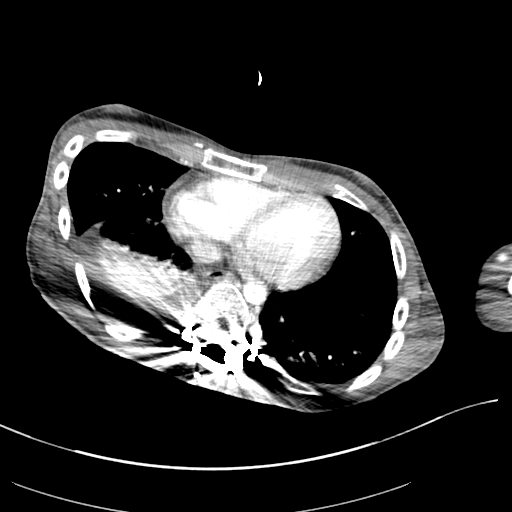

[Series 6: coronals · coronal · 0.76mm/px · 3 of 101 slices shown]
[im 34/101  soft-tissue]
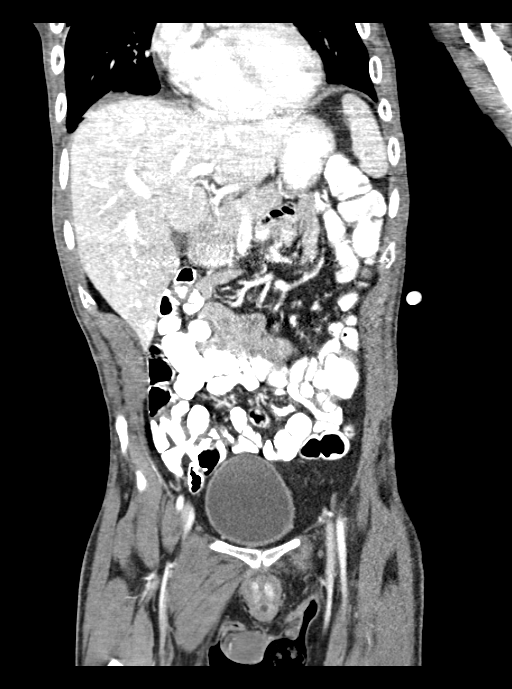
[im 45/101  soft-tissue]
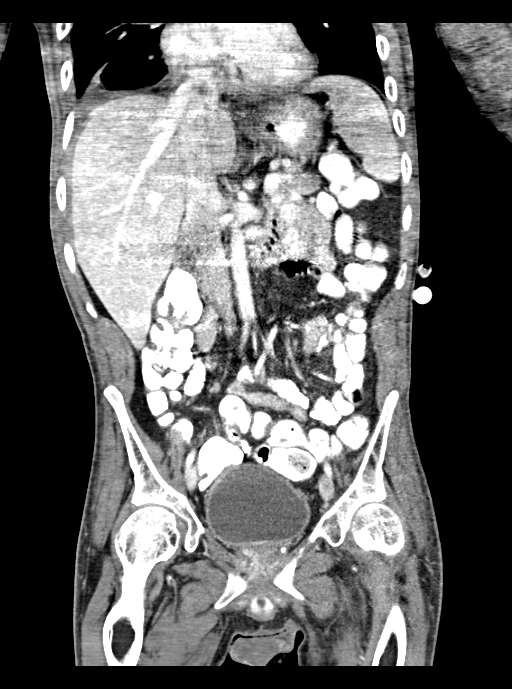
[im 56/101  soft-tissue]
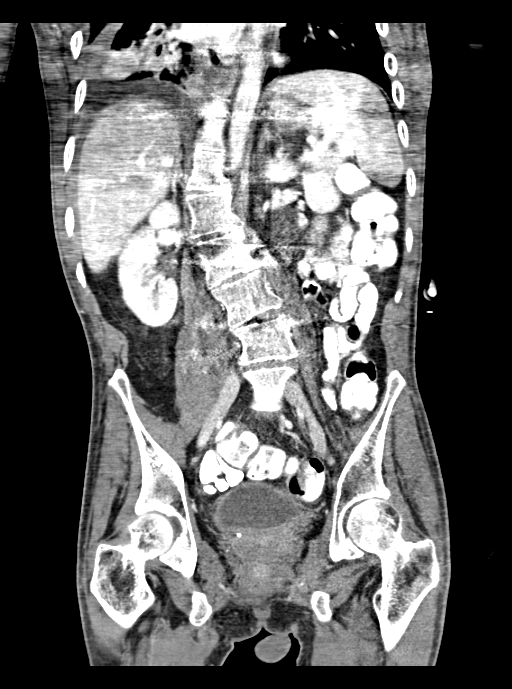

[16 of 46 positions shown; findings below may reference images not displayed]

FINDINGS: Right lower lobe consolidation is partially visualized, as is a
small right pleural effusion. 3 mm focus of calcification versus
aspirated contrast in the right lower lobe is unchanged.

Evaluation of the abdomen is mildly limited by streak artifact from
posterior spinal fusion hardware. Allowing for this, the liver,
gallbladder, spleen, adrenal glands, right kidney, and pancreas have
a grossly unremarkable appearance. 6 mm hypodensity in the lower
pole of the left kidney is grossly unchanged and most likely
represents a cyst.

A percutaneous gastrostomy tube is in place. Oral contrast is
present in the loops of nondilated small and large bowel without
evidence of obstruction. Rectal wall thickening is questioned. The
appendix is identified in the right lower quadrant and is
unremarkable. No free fluid or enlarged lymph nodes are identified.

Bladder is more distended than on the prior study without evidence
of wall thickening. Prostate is upper limits of normal in size.
Coarse calcifications in the right lower abdominal wall are
unchanged. Moderate thoracolumbar dextroscoliosis is again seen with
prior posterior thoracolumbar fusion noted.
IMPRESSION: 1. Small right pleural effusion and right lower lobe consolidation,
incompletely visualized but suspicious for pneumonia.
2. Question rectal wall thickening. Otherwise no evidence of acute
abnormality or mass in the abdomen or pelvis.

## 2015-04-15 DIAGNOSIS — G800 Spastic quadriplegic cerebral palsy: Secondary | ICD-10-CM | POA: Diagnosis not present

## 2015-04-22 ENCOUNTER — Telehealth: Payer: Self-pay | Admitting: Family Medicine

## 2015-04-22 NOTE — Telephone Encounter (Signed)
So I have written this before I believe but not frequently we will have to find the order may be in a mychart message from Mom or in misce orders? Not sure where. If unable to find, Mom knows what formula he is using and at what rate, how much she needs for him

## 2015-04-22 NOTE — Telephone Encounter (Signed)
Caller name: Tomi Bamberger  Call back number: [facility]  Pharmacy: [phone]   Reason for call:  Requesting verbal orders to send referral over to [facility] regarding Enteral formula

## 2015-04-23 NOTE — Telephone Encounter (Signed)
A verbal is fine but eventually there is a bucket full of paper work that goes along with his feeds

## 2015-04-23 NOTE — Telephone Encounter (Signed)
Think they are needing only a verbal order to send referral to [facility]?

## 2015-04-23 NOTE — Telephone Encounter (Signed)
Called [facility] and spoke to [place] and gave verbal ok to [facility].

## 2015-04-24 IMAGING — CR DG CHEST 2V
1 series · 1 of 1 positions shown · non-contrast
Comparison: 09/05/2014 and earlier.

CLINICAL DATA: 28-year-old male with recent pneumonia. Diarrhea
dehydration in cough. Initial encounter.

EXAM:
CHEST  2 VIEW

[w chest lat]
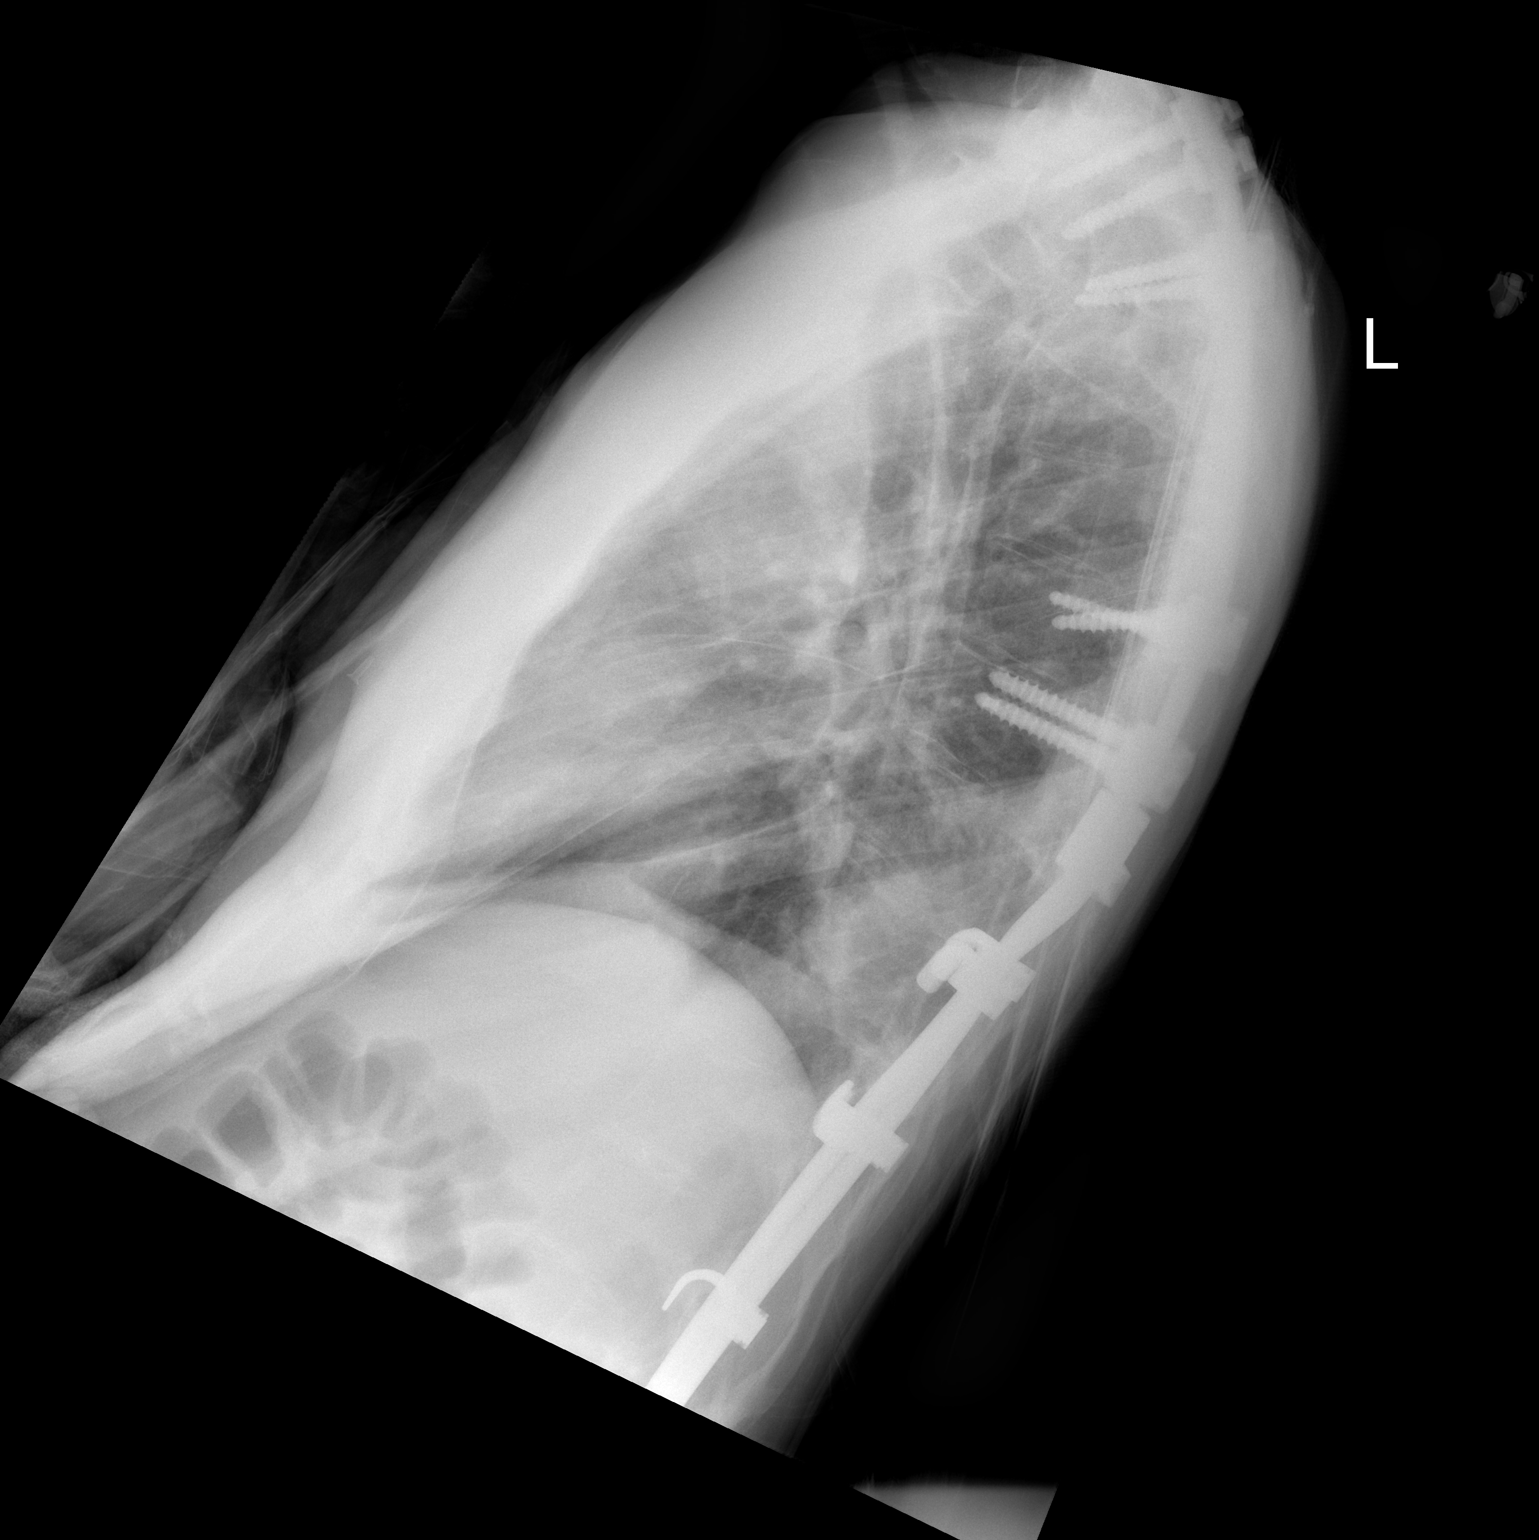

[1 of 1 positions shown; findings below may reference images not displayed]

FINDINGS: Upright AP and lateral views of the chest. Extensive posterior
spinal hardware partially re - identified. Stable lung volumes.
Confluent right retrocardiac and lower lobe opacity persists. Small
right pleural effusion suspected. Left lung stable and clear. No
pneumothorax or pulmonary edema. Stable cardiac size and mediastinal
contours. Disconnected appearing catheter or wire projecting over
the medial right lung apex is in the posterior chest wall soft
tissues as demonstrated on 09/06/2014 neck CT. No pneumoperitoneum.
Negative visible bowel gas in the left upper quadrant.
IMPRESSION: 1. Persistent right lower lobe pneumonia with small pleural
effusion.
2. No new cardiopulmonary abnormality identified.

## 2015-05-07 ENCOUNTER — Encounter: Payer: Self-pay | Admitting: Family Medicine

## 2015-05-07 ENCOUNTER — Ambulatory Visit (INDEPENDENT_AMBULATORY_CARE_PROVIDER_SITE_OTHER): Payer: Medicare Other | Admitting: Family Medicine

## 2015-05-07 ENCOUNTER — Telehealth: Payer: Self-pay | Admitting: Family Medicine

## 2015-05-07 VITALS — BP 108/70 | HR 58 | Temp 97.6°F

## 2015-05-07 DIAGNOSIS — M545 Low back pain: Secondary | ICD-10-CM

## 2015-05-07 DIAGNOSIS — Z79899 Other long term (current) drug therapy: Secondary | ICD-10-CM | POA: Diagnosis not present

## 2015-05-07 DIAGNOSIS — E0590 Thyrotoxicosis, unspecified without thyrotoxic crisis or storm: Secondary | ICD-10-CM

## 2015-05-07 DIAGNOSIS — D508 Other iron deficiency anemias: Secondary | ICD-10-CM | POA: Diagnosis not present

## 2015-05-07 DIAGNOSIS — R634 Abnormal weight loss: Secondary | ICD-10-CM

## 2015-05-07 DIAGNOSIS — R638 Other symptoms and signs concerning food and fluid intake: Secondary | ICD-10-CM

## 2015-05-07 DIAGNOSIS — J690 Pneumonitis due to inhalation of food and vomit: Secondary | ICD-10-CM

## 2015-05-07 DIAGNOSIS — E43 Unspecified severe protein-calorie malnutrition: Secondary | ICD-10-CM

## 2015-05-07 NOTE — Patient Instructions (Signed)
Anemia, Nonspecific Anemia is a condition in which the concentration of red blood cells or hemoglobin in the blood is below normal. Hemoglobin is a substance in red blood cells that carries oxygen to the tissues of the body. Anemia results in not enough oxygen reaching these tissues.  CAUSES  Common causes of anemia include:   Excessive bleeding. Bleeding may be internal or external. This includes excessive bleeding from periods (in women) or from the intestine.   Poor nutrition.   Chronic kidney, thyroid, and liver disease.  Bone marrow disorders that decrease red blood cell production.  Cancer and treatments for cancer.  HIV, AIDS, and their treatments.  Spleen problems that increase red blood cell destruction.  Blood disorders.  Excess destruction of red blood cells due to infection, medicines, and autoimmune disorders. SIGNS AND SYMPTOMS   Minor weakness.   Dizziness.   Headache.  Palpitations.   Shortness of breath, especially with exercise.   Paleness.  Cold sensitivity.  Indigestion.  Nausea.  Difficulty sleeping.  Difficulty concentrating. Symptoms may occur suddenly or they may develop slowly.  DIAGNOSIS  Additional blood tests are often needed. These help your health care provider determine the best treatment. Your health care provider will check your stool for blood and look for other causes of blood loss.  TREATMENT  Treatment varies depending on the cause of the anemia. Treatment can include:   Supplements of iron, vitamin B12, or folic acid.   Hormone medicines.   A blood transfusion. This may be needed if blood loss is severe.   Hospitalization. This may be needed if there is significant continual blood loss.   Dietary changes.  Spleen removal. HOME CARE INSTRUCTIONS Keep all follow-up appointments. It often takes many weeks to correct anemia, and having your health care provider check on your condition and your response to  treatment is very important. SEEK IMMEDIATE MEDICAL CARE IF:   You develop extreme weakness, shortness of breath, or chest pain.   You become dizzy or have trouble concentrating.  You develop heavy vaginal bleeding.   You develop a rash.   You have bloody or black, tarry stools.   You faint.   You vomit up blood.   You vomit repeatedly.   You have abdominal pain.  You have a fever or persistent symptoms for more than 2-3 days.   You have a fever and your symptoms suddenly get worse.   You are dehydrated.  MAKE SURE YOU:  Understand these instructions.  Will watch your condition.  Will get help right away if you are not doing well or get worse. Document Released: 11/26/2004 Document Revised: 06/21/2013 Document Reviewed: 04/14/2013 Exit[employer] Patient Information 2015 [employer]. This information is not intended to replace advice given to you by your health care provider. Make sure you discuss any questions you have with your health care provider.  

## 2015-05-07 NOTE — Telephone Encounter (Signed)
Please print this order, I have written before and I will sign. 6 month supply

## 2015-05-07 NOTE — Telephone Encounter (Signed)
Caller name: Aldona Bar with [facility] Can be reached: [phone] Fax#: [phone]  Reason for call: Needing to obtain order for enteral/tube feeding for patient. This is a DME transfer from [facility] (due to competitive bidding [facility] is the provider in this area). They need signed new order effective 05/03/15. They have that the pt is using Jevity 1.5 112ml per hour for 12 hours daily by pump via g-tube. Order can be faxed to # above, attn: Samantha. Please make sure order is signed, not stamped, by Dr. Charlett Blake.

## 2015-05-07 NOTE — Progress Notes (Signed)
Pre visit review using our clinic review tool, if applicable. No additional management support is needed unless otherwise documented below in the visit note. 

## 2015-05-08 LAB — CBC
HCT: 44.2 % (ref 39.0–52.0)
Hemoglobin: 14.7 g/dL (ref 13.0–17.0)
MCHC: 33.3 g/dL (ref 30.0–36.0)
MCV: 89.1 fl (ref 78.0–100.0)
Platelets: 200 10*3/uL (ref 150.0–400.0)
RBC: 4.96 Mil/uL (ref 4.22–5.81)
RDW: 13.1 % (ref 11.5–15.5)
WBC: 7.3 10*3/uL (ref 4.0–10.5)

## 2015-05-08 LAB — COMPREHENSIVE METABOLIC PANEL
ALT: 13 U/L (ref 0–53)
AST: 16 U/L (ref 0–37)
Albumin: 4.2 g/dL (ref 3.5–5.2)
Alkaline Phosphatase: 72 U/L (ref 39–117)
BUN: 10 mg/dL (ref 6–23)
CO2: 25 mEq/L (ref 19–32)
Calcium: 9.5 mg/dL (ref 8.4–10.5)
Chloride: 106 mEq/L (ref 96–112)
Creatinine, Ser: 0.48 mg/dL (ref 0.40–1.50)
GFR: 215.88 mL/min (ref >60.00)
Glucose, Bld: 72 mg/dL (ref 70–99)
Potassium: 4.2 mEq/L (ref 3.5–5.1)
Sodium: 141 mEq/L (ref 135–145)
Total Bilirubin: 0.4 mg/dL (ref 0.2–1.2)
Total Protein: 8.1 g/dL (ref 6.0–8.3)

## 2015-05-08 LAB — TSH: TSH: 2.51 u[IU]/mL (ref 0.35–4.50)

## 2015-05-08 LAB — VALPROIC ACID LEVEL: Valproic Acid Lvl: 63 ug/mL (ref 50.0–100.0)

## 2015-05-09 ENCOUNTER — Encounter: Payer: Self-pay | Admitting: Family Medicine

## 2015-05-09 ENCOUNTER — Ambulatory Visit (HOSPITAL_COMMUNITY)
Admission: RE | Admit: 2015-05-09 | Discharge: 2015-05-09 | Disposition: A | Discharge status: Home / Self Care | Payer: Medicare Other | Source: Ambulatory Visit | Attending: Interventional Radiology | Admitting: Interventional Radiology

## 2015-05-09 ENCOUNTER — Other Ambulatory Visit (HOSPITAL_COMMUNITY): Payer: Self-pay | Admitting: Interventional Radiology

## 2015-05-09 DIAGNOSIS — K9413 Enterostomy malfunction: Secondary | ICD-10-CM

## 2015-05-09 DIAGNOSIS — K9419 Other complications of enterostomy: Secondary | ICD-10-CM | POA: Insufficient documentation

## 2015-05-09 DIAGNOSIS — K9423 Gastrostomy malfunction: Secondary | ICD-10-CM | POA: Diagnosis not present

## 2015-05-09 MED ORDER — JEVITY 1.5 CAL PO LIQD
ORAL | Status: DC
Start: 2015-05-09 — End: 2015-06-10

## 2015-05-09 MED ORDER — IOHEXOL 300 MG/ML  SOLN
10.0000 mL | Freq: Once | INTRAMUSCULAR | Status: AC | PRN
  Administered 2015-05-09: 10 mL

## 2015-05-09 MED ORDER — JEVITY 1.5 CAL PO LIQD: ORAL | Status: DC

## 2015-05-09 NOTE — Telephone Encounter (Signed)
Spoke with representative from [facility] to clarify orders- orders for Jevity nutritional supplement and kangaroo feeding tube printed and awaiting signature.

## 2015-05-09 NOTE — Procedures (Signed)
Successful exchg of the chronic GJ tube No comp Stable Ready for use

## 2015-05-09 NOTE — Telephone Encounter (Signed)
Faxed hardcopy to [facility] at [phone]

## 2015-05-12 ENCOUNTER — Encounter: Payer: Self-pay | Admitting: Family Medicine

## 2015-05-12 NOTE — Assessment & Plan Note (Signed)
Unable to weight here today but is going to daycare a couple times a week now and can weigh there will let us know

## 2015-05-12 NOTE — Assessment & Plan Note (Signed)
No recent flares since changing to tube feeds

## 2015-05-12 NOTE — Assessment & Plan Note (Signed)
They are in need of a new prescription for his Jevity feeds due to his insurance changing the required home health agency, will proceed

## 2015-05-12 NOTE — Progress Notes (Signed)
[name]  [id number] [birth year] 05/12/2015      Progress Note-Follow Up  Subjective  Chief Complaint  Chief Complaint  Patient presents with  . Follow-up    HPI  Patient is a 29 y.o. male in today for routine medical care. He is in today with his mother, he is doing well lately. He has not had any recurrent pneumonia since they switched to PEG tube feeds. As his breathing has improved so has his anxiety. No new or acute concerns. Denies CP/palp/SOB/HA/congestion/fevers/GI or GU c/o. Taking meds as prescribed  Past Medical History  Diagnosis Date  . Cerebral palsy   . GERD (gastroesophageal reflux disease)   . Hyperthyroidism   . Incontinence of feces   . Palpitations   . Esophagitis 2011  . Dehydration 11/22/2013  . Loss of weight 08/28/2014  . Depression with anxiety 08/01/2010    Qualifier: Diagnosis of  By: Nelson-Smith CMA (AAMA), Dottie      Past Surgical History  Procedure Laterality Date  . Spine surgery  ,11/20/2010, 2011    for correction of severe contracturing spinal kyphosis.   . Eye surgery    . Ears tubes    . Hamstring released      to treat contractures.   . Baclofen trial    . Baslofen pump implant    . Spinal fusion    . G-tube insert  August 2006  . Spinal fusioncorrect 106 degree kyphosis    . Spinal fusion to correct 70 degree kyphosis  11-2010  . Tonsillectomy    . Peg placement  10/21/2011    Procedure: PERCUTANEOUS ENDOSCOPIC GASTROSTOMY (PEG) REPLACEMENT;  Surgeon: [name], MD;  Location: [facility] END[facility]COPY;  Service: Endoscopy;  Laterality: N/A;  . Peg placement N/A 06/13/2013    Procedure: PERCUTANEOUS ENDOSCOPIC GASTROSTOMY (PEG) REPLACEMENT;  Surgeon: [name], MD;  Location: [facility];  Service: Endoscopy;  Laterality: N/A;  . Hip surgery      x2 , side   . Flexible sigmoidoscopy N/A 09/07/2014    Procedure: FLEXIBLE SIGMOIDOSCOPY;  Surgeon: [name], MD;  Location: [facility] ENDOSCOPY;  Service: Endoscopy;  Laterality: N/A;      Family History  Problem Relation Age of Onset  . Asthma Mother   . Hyperlipidemia Mother   . COPD Mother   . Other Mother     bronchial stasis/ABPA  . Cancer Maternal Grandmother 52    breast  . Hyperlipidemia Maternal Grandmother   . Hypertension Maternal Grandmother   . Cancer Maternal Grandfather     prostate  . Heart disease Paternal Grandfather     CHF  . Osteoporosis Paternal Grandmother   . Arthritis Paternal Grandmother     rheumatoid    History   Social History  . Marital Status: Single    Spouse Name: N/A  . Number of Children: 0  . Years of Education: N/A   Occupational History  . disbaled    Social History Main Topics  . Smoking status: Never Smoker   . Smokeless tobacco: Never Used  . Alcohol Use: No  . Drug Use: No  . Sexual Activity: No   Other Topics Concern  . Not on file   Social History Narrative    Current Outpatient Prescriptions on File Prior to Visit  Medication Sig Dispense Refill  . AMBULATORY NON FORMULARY MEDICATION Medication Name: MIC gastrostomy/bolus feeding tube 24 French Part number 0110-24. #2 and 10 cc lurer lock syringe #2 Dx: 4 Device  2  . bacitracin 500 UNIT/GM ointment Apply 1 application topically 2 (two) times daily. 30 g 1  . clotrimazole-betamethasone (LOTRISONE) cream Apply 1 application topically 2 (two) times daily. 45 g 1  . dantrolene (DANTRIUM) 50 MG capsule TAKE 1 CAPSULE BY MOUTH 2 TIMES DAILY. 60 capsule 3  . diazepam (VALIUM) 2 MG tablet TAKE 1 TO 2 TABLETS AT BEDTIME. 60 tablet 2  . diphenoxylate-atropine (LOMOTIL) 2.5-0.025 MG per tablet Take 1 tablet by mouth 4 (four) times daily as needed for diarrhea or loose stools. 8 tablet 0  . divalproex (DEPAKOTE SPRINKLE) 125 MG capsule Take 2 capsules in morning, 5 capsules at bedtime (Patient taking differently: 250-625 mg. Take 2 capsules in morning, 5 capsules at bedtime) 210 capsule 4  . Feeding Tubes - Bags (FLEXIFLO FEEDING BAG/PUMP) MISC 1 Units by  Does not apply route continuous. 1 each 30  . Feeding Tubes - Pump MISC 1 Units by Does not apply route continuous. 1 each 0  . Feeding Tubes - Sets (KANGAROO EPUMP SET 1000ML) MISC 30 day supply of Kangaroo Joey PUmp set with flush bag 1 each 5  . Incontinence Supplies (BARD LEG BAG STRAPS/FABRIC) MISC Bard Dispoz-a-Bag leg bag w/flip flo Valve, sterile, w/Gabric strap, 18" extension tubing 19 oz  Item #[id number] 2 each 6  . Incontinence Supply Disposable (PREVAIL BREEZERS MEDIUM) MISC pkg of 16- size medium 32" to 44"  Breathable cloth-like outer fabric (can't use the plastic outer surgace  Item # [id number] 16 each 6  . LORazepam (ATIVAN) 1 MG tablet Take 1 tab po tid prn for anxiety 90 tablet 1  . metoprolol tartrate (LOPRESSOR) 25 MG tablet Take 25 mg by mouth 2 (two) times daily as needed. Palpitaitons, tachycardia    . NON FORMULARY Bard Leg Bag Extension tubing w/Connector 18", Sterile, latex-free  Item# [id number]    . NON FORMULARY Colorplast Freedom Cath Latex Self-Adhering Male External Catheter 20mm Diameter Intermediate  Item# [id number]    . nystatin (MYCOSTATIN) 100000 UNIT/ML suspension     . nystatin cream (MYCOSTATIN) Apply 1 application topically 2 (two) times daily as needed for dry skin. 30 g 1  . OLANZapine (ZYPREXA) 5 MG tablet Take 5 mg by mouth at bedtime.    Earney Navy Bicarbonate (ZEGERID) 20-1100 MG CAPS capsule Take 1 capsule by mouth daily before breakfast.    . Ostomy Supplies (PROTECTIVE BARRIER WIPES) MISC 1-1/4" X 3"  Item #[id number] 75 each 6  . oxybutynin (DITROPAN) 5 MG/5ML syrup Take 5 mg by mouth 2 (two) times daily.     Marland Kitchen PARoxetine (PAXIL) 10 MG tablet Take 1 tablet (10 mg total) by mouth daily.    Marland Kitchen PRESCRIPTION MEDICATION Colorplast Freedom Cath Latex Self-Adhering Male External Catheter 36mm Diameter Intermediate  Item# [id number]    . sucralfate (CARAFATE) 1 G tablet 1 tablet twice daily via PEG 60 tablet 1   No current facility-administered  medications on file prior to visit.    Allergies  Allergen Reactions  . Ambien [Zolpidem Tartrate] Nausea Only  . Codeine Other (See Comments)    Makes patient too active after a few days.  . Baclofen Anxiety  . Sulfonamide Derivatives Rash    Review of Systems  Review of Systems  Constitutional: Negative for fever and malaise/fatigue.  HENT: Negative for congestion.   Eyes: Negative for discharge.  Respiratory: Negative for shortness of breath.   Cardiovascular: Negative for chest pain, palpitations and leg swelling.  Gastrointestinal: Negative for nausea, abdominal pain  and diarrhea.  Genitourinary: Negative for dysuria.  Musculoskeletal: Negative for falls.  Skin: Negative for rash.  Neurological: Negative for loss of consciousness and headaches.  Endo/Heme/Allergies: Negative for polydipsia.  Psychiatric/Behavioral: Negative for depression and suicidal ideas. The patient is not nervous/anxious and does not have insomnia.     Objective  BP 108/70 mmHg  Pulse 58  Temp(Src) 97.6 F (36.4 C) (Oral)  SpO2 97%  Physical Exam  Physical Exam  Constitutional: He is oriented to person, place, and time and well-developed, well-nourished, and in no distress. No distress.   Cachectic, in wheelchair, contractures  HENT:  Head: Normocephalic and atraumatic.  Eyes: Conjunctivae are normal.  Neck: Neck supple. No thyromegaly present.  Cardiovascular: Normal rate, regular rhythm and normal heart sounds.   No murmur heard. Pulmonary/Chest: Effort normal and breath sounds normal. No respiratory distress.  Abdominal: Soft. Bowel sounds are normal. He exhibits no distension and no mass. There is no tenderness.  LUQ gastrostomy tube in place, no surrounding erythema or fluctuance  Musculoskeletal: He exhibits no edema.  Neurological: He is alert and oriented to person, place, and time.  Skin: Skin is warm.  Psychiatric: Memory, affect and judgment normal.    Lab Results    Component Value Date   TSH 2.51 05/07/2015   Lab Results  Component Value Date   WBC 7.3 05/07/2015   HGB 14.7 05/07/2015   HCT 44.2 05/07/2015   MCV 89.1 05/07/2015   PLT 200.0 05/07/2015   Lab Results  Component Value Date   CREATININE 0.48 05/07/2015   BUN 10 05/07/2015   NA 141 05/07/2015   K 4.2 05/07/2015   CL 106 05/07/2015   CO2 25 05/07/2015   Lab Results  Component Value Date   ALT 13 05/07/2015   AST 16 05/07/2015   ALKPHOS 72 05/07/2015   BILITOT 0.4 05/07/2015   Lab Results  Component Value Date   CHOL 151 04/25/2010   Lab Results  Component Value Date   HDL 29.90* 04/25/2010   Lab Results  Component Value Date   LDLCALC 96 04/25/2010   Lab Results  Component Value Date   TRIG 125.0 04/25/2010   Lab Results  Component Value Date   CHOLHDL 5 04/25/2010     Assessment & Plan  Protein-calorie malnutrition, severe They are in need of a new prescription for his Jevity feeds due to his insurance changing the required home health agency, will proceed  Loss of weight Unable to weight here today but is going to daycare a couple times a week now and can weigh there will let us know  Low back pain No recent complaints of pain  ASPIRATION PNEUMONIA No recent flares since changing to tube feeds

## 2015-05-12 NOTE — Assessment & Plan Note (Signed)
No recent complaints of pain

## 2015-05-16 DIAGNOSIS — G800 Spastic quadriplegic cerebral palsy: Secondary | ICD-10-CM | POA: Diagnosis not present

## 2015-05-22 IMAGING — CR DG ABDOMEN 2V
1 series · 1 of 1 positions shown · non-contrast
Comparison: 11/29/2013 abdominal radiographs, CT abdomen and pelvis
09/06/2014

CLINICAL DATA: Diarrhea for 2 weeks, LEFT side abdominal pain,
history cerebral palsy, GERD

EXAM:
ABDOMEN - 2 VIEW

[view not recorded]
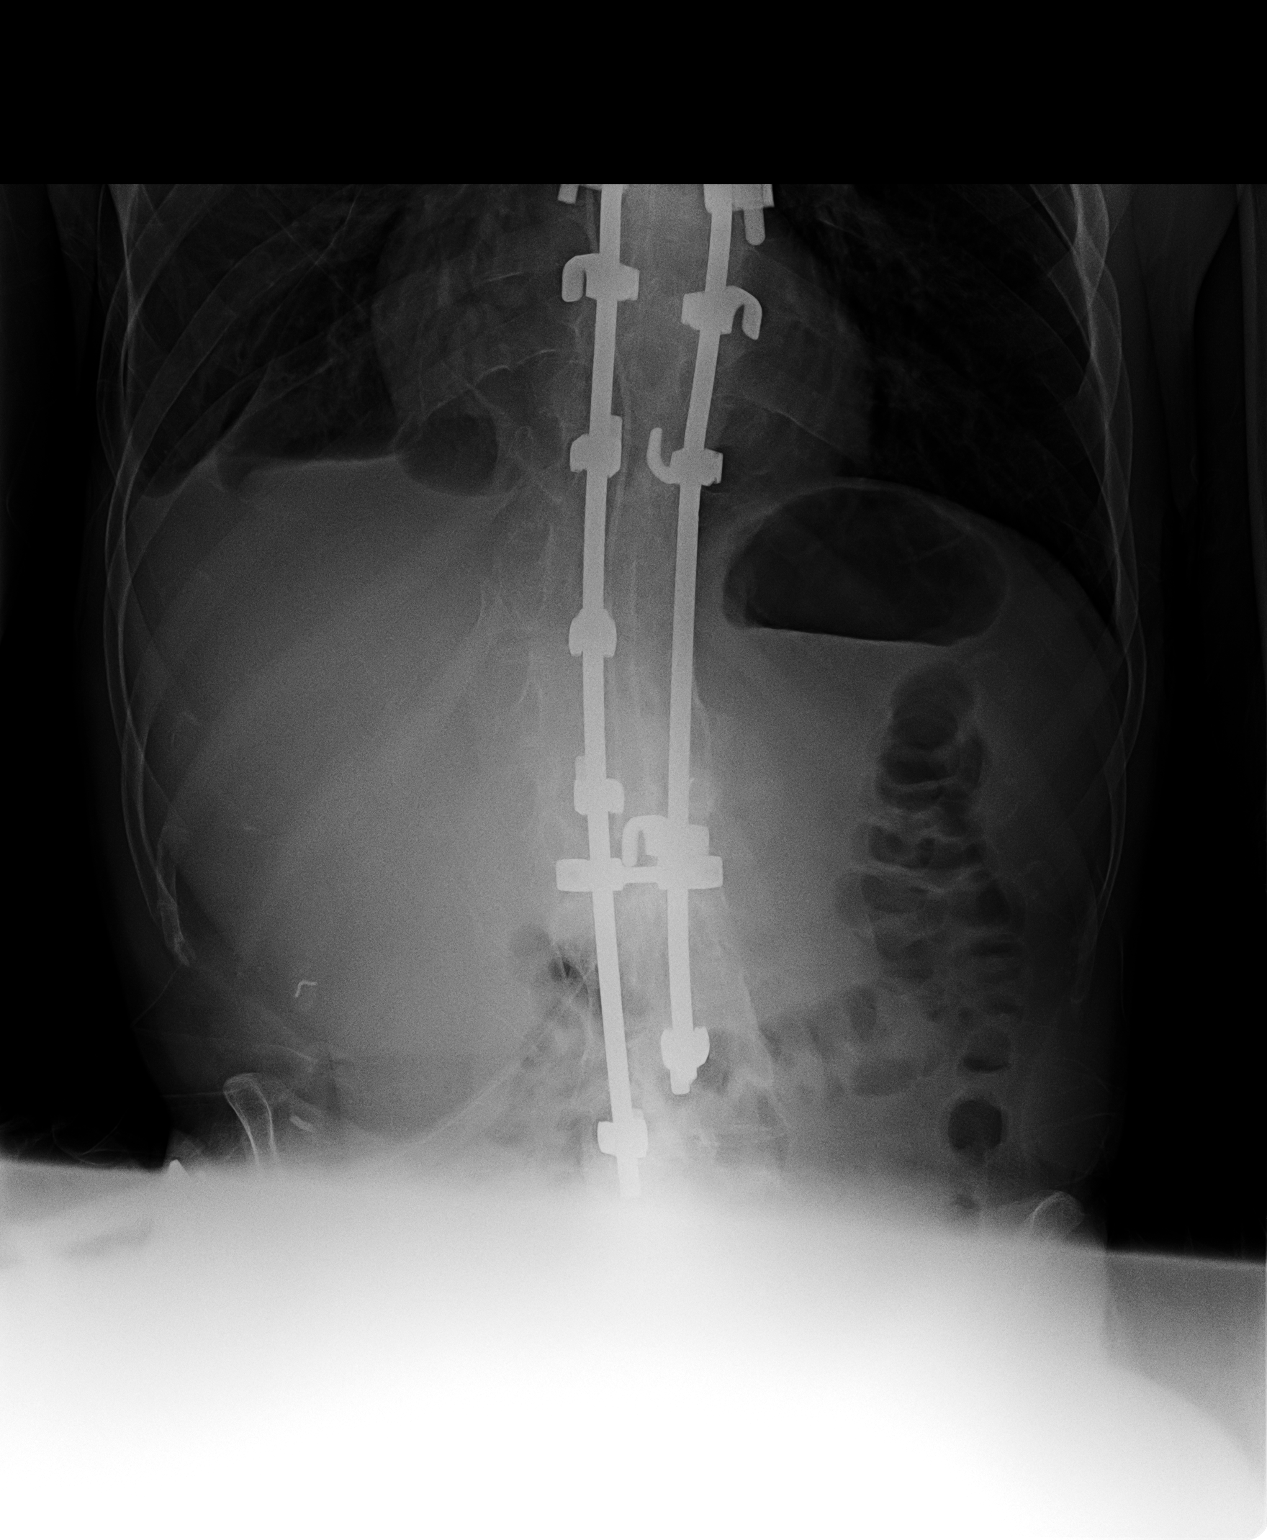

[1 of 1 positions shown; findings below may reference images not displayed]

FINDINGS: Lung bases hyperinflated with minimal atelectasis or scarring at
RIGHT base.

Bones demineralized with evidence of prior thoracolumbar spinal
fixation for scoliosis.

Gastrostomy tube projects over gastric antrum.

Nonobstructive bowel gas pattern.

No bowel dilatation or bowel wall thickening.

No free intraperitoneal air.

No urinary tract calcification.
IMPRESSION: Normal bowel gas pattern.

## 2015-06-01 ENCOUNTER — Other Ambulatory Visit: Payer: Self-pay | Admitting: Physical Medicine & Rehabilitation

## 2015-06-04 ENCOUNTER — Encounter: Payer: Self-pay | Admitting: Family Medicine

## 2015-06-04 ENCOUNTER — Telehealth: Payer: Self-pay | Admitting: *Deleted

## 2015-06-04 MED ORDER — LORAZEPAM 1 MG PO TABS: ORAL_TABLET | ORAL | Status: DC

## 2015-06-04 MED ORDER — NONFORMULARY OR COMPOUNDED ITEM: Status: DC

## 2015-06-04 NOTE — Telephone Encounter (Signed)
FYI -  The patient was sent a syringe instead of bags to do flushes.  Spoke to Lucan, mother, who states that he needs the bags to program the pump for flushes instead of doing manually.  Spoke with [place] who stated that they will need medical necessity to show that patient needs these flushes vs manual.  They stated it is hard to qualify for this.  Per mother, patient needs because he has a history of dehydration and she programs feeds overnight and the flush every 4 hours.  Also, he gets feedings through the J-tube which is smaller than the G-tube and his formula is high protein so is thick.  For patency as well as hydration, they need this flush overnight.  She stated she spoke with Ray from [facility] who they previously used to see how they qualified before and she will send a MyChart message with suggestions (will look for).

## 2015-06-04 NOTE — Telephone Encounter (Signed)
Caller name: Arbie Cookey  [facility])   Relationship to patient: [place]  Can be reached:  Pharmacy:  Reason for call: Arbie Cookey states that she need all of the clinical documentation supporting why this order is needed. She says that she received only 1 page (the order).

## 2015-06-04 NOTE — Telephone Encounter (Signed)
Re-ordered and faxed to [facility] nutrition team.

## 2015-06-06 ENCOUNTER — Encounter: Payer: Self-pay | Admitting: Family Medicine

## 2015-06-10 ENCOUNTER — Other Ambulatory Visit: Payer: Self-pay | Admitting: *Deleted

## 2015-06-10 MED ORDER — JEVITY 1.5 CAL PO LIQD: ORAL | Status: DC

## 2015-06-11 ENCOUNTER — Other Ambulatory Visit: Payer: Self-pay | Admitting: *Deleted

## 2015-06-11 ENCOUNTER — Telehealth: Payer: Self-pay | Admitting: Family Medicine

## 2015-06-11 DIAGNOSIS — R350 Frequency of micturition: Secondary | ICD-10-CM | POA: Diagnosis not present

## 2015-06-11 DIAGNOSIS — N3941 Urge incontinence: Secondary | ICD-10-CM | POA: Diagnosis not present

## 2015-06-11 MED ORDER — JEVITY 1.5 CAL PO LIQD: ORAL | Status: DC

## 2015-06-11 NOTE — Telephone Encounter (Signed)
Caller name: Anan Dapolito  Relationship to patient: Mother  Can be reached: [phone] Pharmacy:  Reason for call: pt's mother would like to speak with you in regards to her son's med Rx. Feeding Tubes. Please call back to advise.

## 2015-06-11 NOTE — Telephone Encounter (Signed)
Tube feeding re-ordered to reflect 5 cans/day per mother's request.

## 2015-06-11 NOTE — Telephone Encounter (Signed)
Mother requested increase in amount of cans/day of tube feeding.  Rx re-ordered to reflect 5 cans/day.

## 2015-06-27 ENCOUNTER — Other Ambulatory Visit: Payer: Self-pay | Admitting: Physical Medicine & Rehabilitation

## 2015-06-27 NOTE — Telephone Encounter (Signed)
We rec'd an electronic request to refill pt's dantrium. His last visit with you was 11/20/2014 in which your note read 'Maintain dantrium but if BP continues low can reduce AM dose to 25mg  or stop completely'.  His next visit with you is 08/02/2015.  How would you like to proceed?

## 2015-07-02 DIAGNOSIS — F339 Major depressive disorder, recurrent, unspecified: Secondary | ICD-10-CM | POA: Diagnosis not present

## 2015-07-10 IMAGING — CR DG ABDOMEN 2V
1 series · 1 of 1 positions shown · non-contrast
Comparison: 10/16/2014

CLINICAL DATA: Vomiting, nausea, losing weight

EXAM:
ABDOMEN - 2 VIEW

[t abdomen supine]
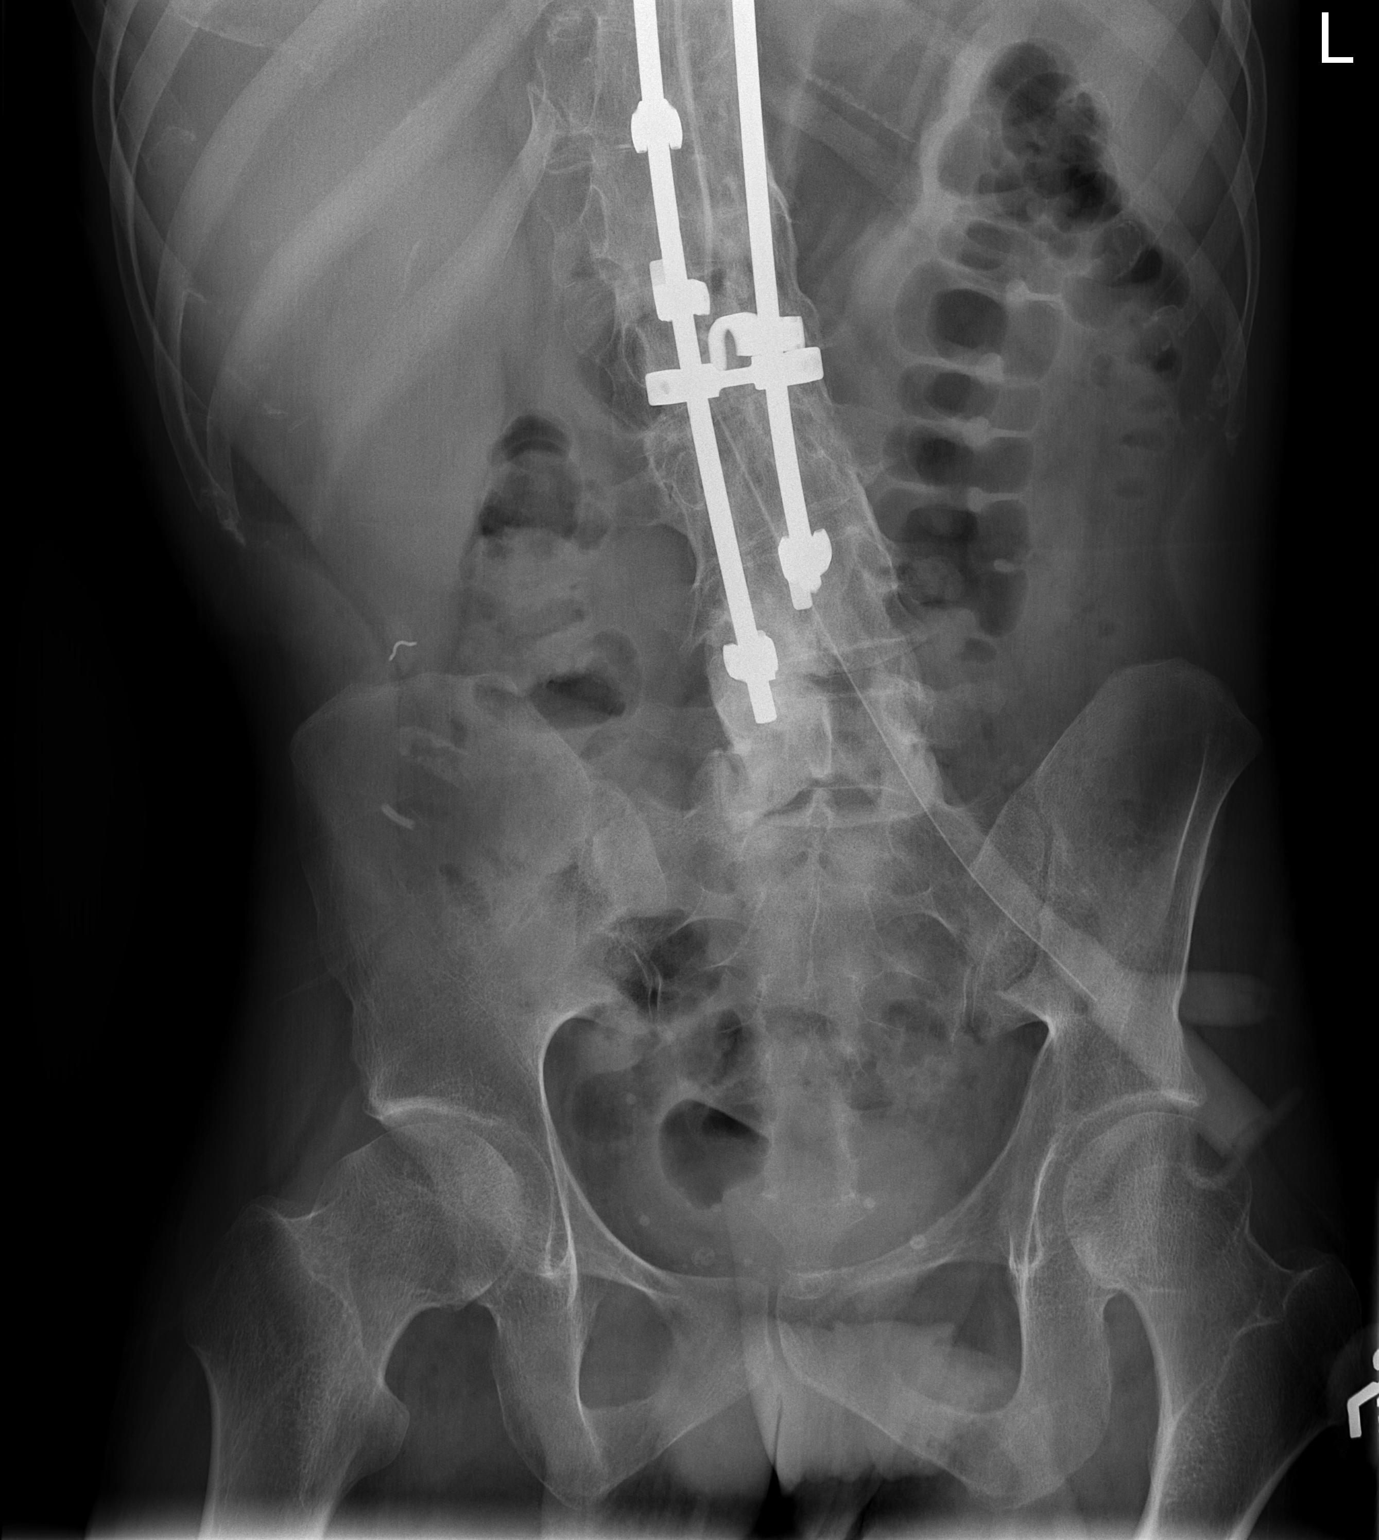

[1 of 1 positions shown; findings below may reference images not displayed]

FINDINGS: Again noted metallic fixation rods thoracolumbar spine. There is
nonspecific nonobstructive bowel gas pattern. A gastrostomy feeding
tube is noted in distal stomach.
IMPRESSION: Nonspecific nonobstructive bowel gas pattern.

## 2015-07-10 IMAGING — CR DG CHEST 2V
1 series · 1 of 1 positions shown · non-contrast
Comparison: 09/18/2014

CLINICAL DATA: Vomiting, cough, possible aspiration

EXAM:
CHEST  2 VIEW

[view not recorded]
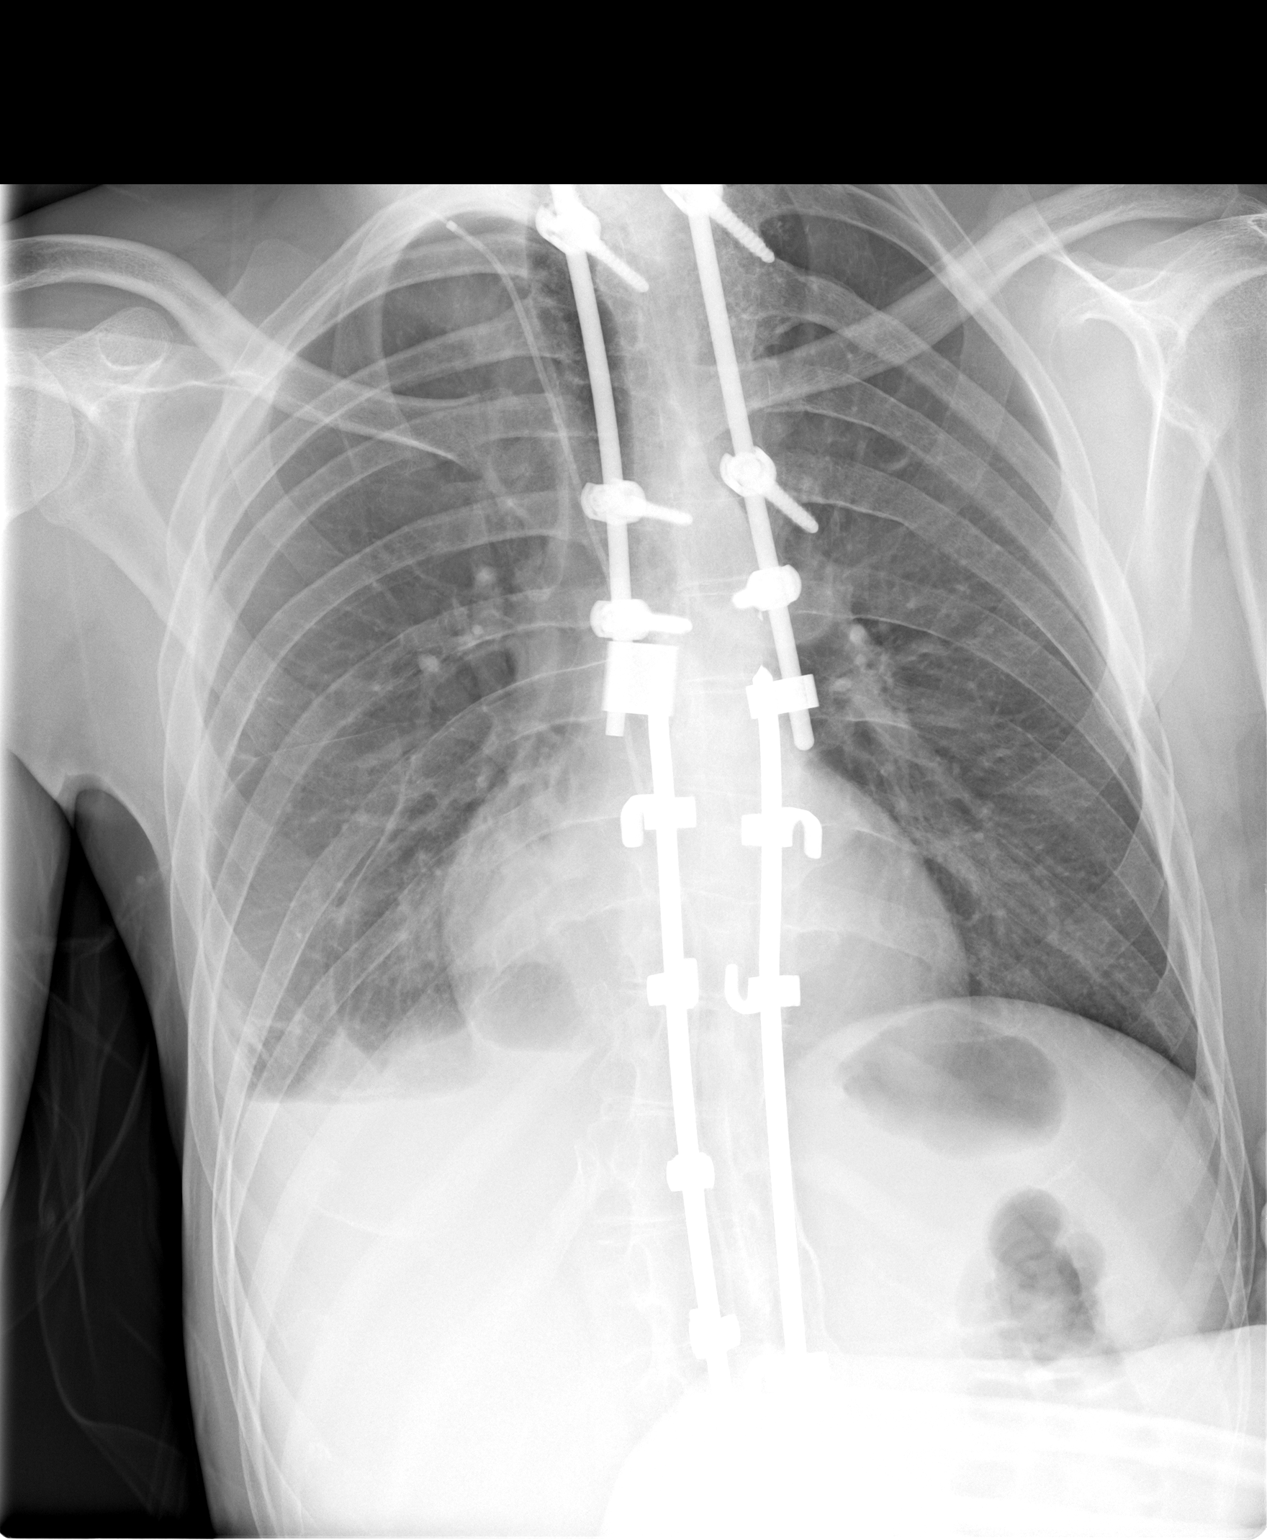

[1 of 1 positions shown; findings below may reference images not displayed]

FINDINGS: Cardiomediastinal silhouette is stable. Metallic fixation rods
thoracolumbar spine again noted. There is recurrent infiltrate
/pneumonia in right lower lobe with small right pleural effusion.
Left lung is clear. No pulmonary edema.
IMPRESSION: Recurrent infiltrate/ pneumonia in right lower lobe with small right
pleural effusion.

## 2015-07-30 ENCOUNTER — Encounter: Payer: Self-pay | Admitting: Physical Medicine & Rehabilitation

## 2015-07-31 MED ORDER — DANTROLENE SODIUM 50 MG PO CAPS: ORAL_CAPSULE | ORAL | Status: DC

## 2015-07-31 NOTE — Telephone Encounter (Signed)
Dantrium adjusted to TID. rx sent to pharmacy

## 2015-08-01 ENCOUNTER — Telehealth: Payer: Self-pay | Admitting: Family Medicine

## 2015-08-01 NOTE — Telephone Encounter (Signed)
Patient Name: [name] DOB: Apr 18, 1986 Initial Comment Caller states, son w/ cerebral palsy, fell out of bed Tues morning, has a few scrapes on forehead and beside his eye. He has an appt tomorrow morning, wants to know if he needs to come in earlier. Nurse Assessment Nurse: Ronnald Ramp, RN, Miranda Date/Time (Eastern Time): 08/01/2015 9:51:51 AM Confirm and document reason for call. If symptomatic, describe symptoms. ---Caller states she did not have any questions. She made him an appt for tomorrow but the office transferred her to speak to a nurse. She does not need any further advice. Has the patient traveled out of the country within the last 30 days? ---Not Applicable Does the patient require triage? ---Declined Triage Guidelines Guideline Title Affirmed Question Affirmed Notes Final Disposition User Clinical Call Ronnald Ramp, RN, Jeannetta Nap

## 2015-08-02 ENCOUNTER — Ambulatory Visit: Payer: Self-pay | Admitting: Physical Medicine & Rehabilitation

## 2015-08-02 ENCOUNTER — Ambulatory Visit: Payer: Medicare Other | Admitting: Family Medicine

## 2015-08-02 ENCOUNTER — Encounter: Payer: Self-pay | Admitting: Family Medicine

## 2015-08-02 ENCOUNTER — Telehealth: Payer: Self-pay | Admitting: Family Medicine

## 2015-08-02 IMAGING — CR DG CHEST 2V
2 series · 2 of 2 positions shown · non-contrast
Comparison: Two-view chest x-ray 12/04/2014.

CLINICAL DATA: Cough since [REDACTED].

EXAM:
CHEST - 2 VIEW

[w chest lat (1 of 2)]
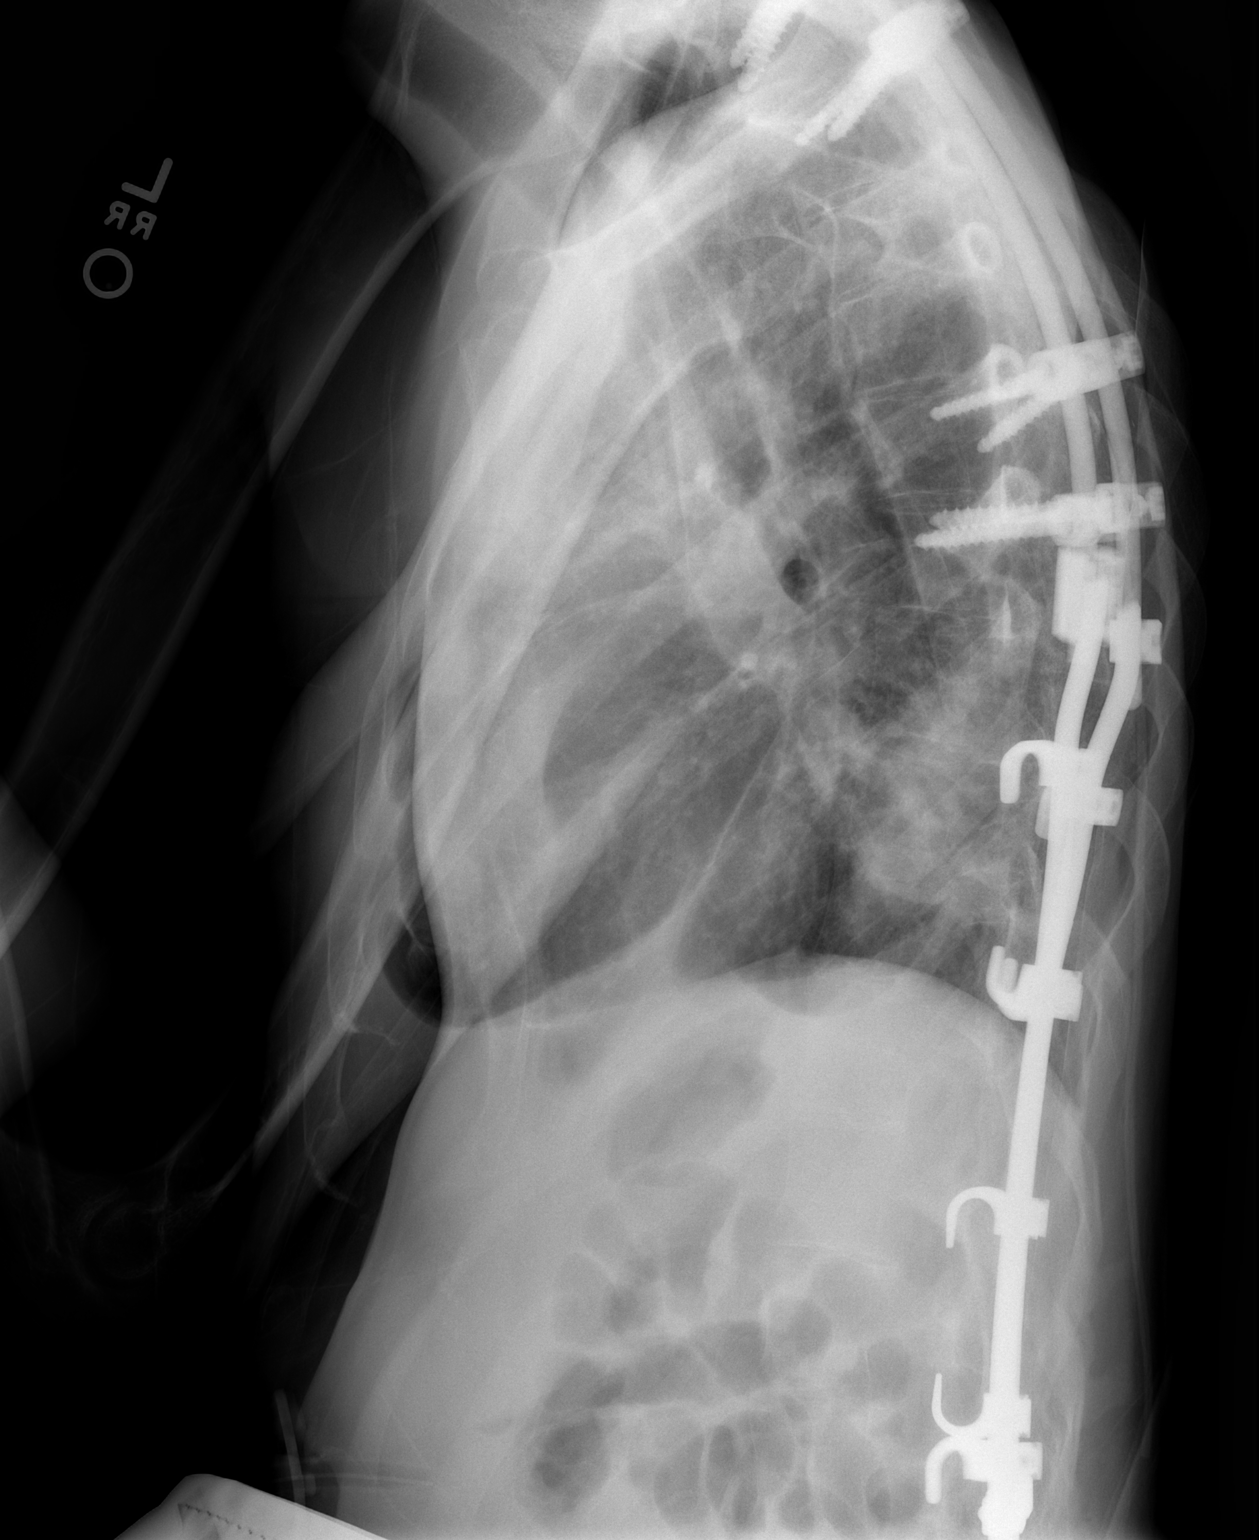

[w chest lat (2 of 2)]
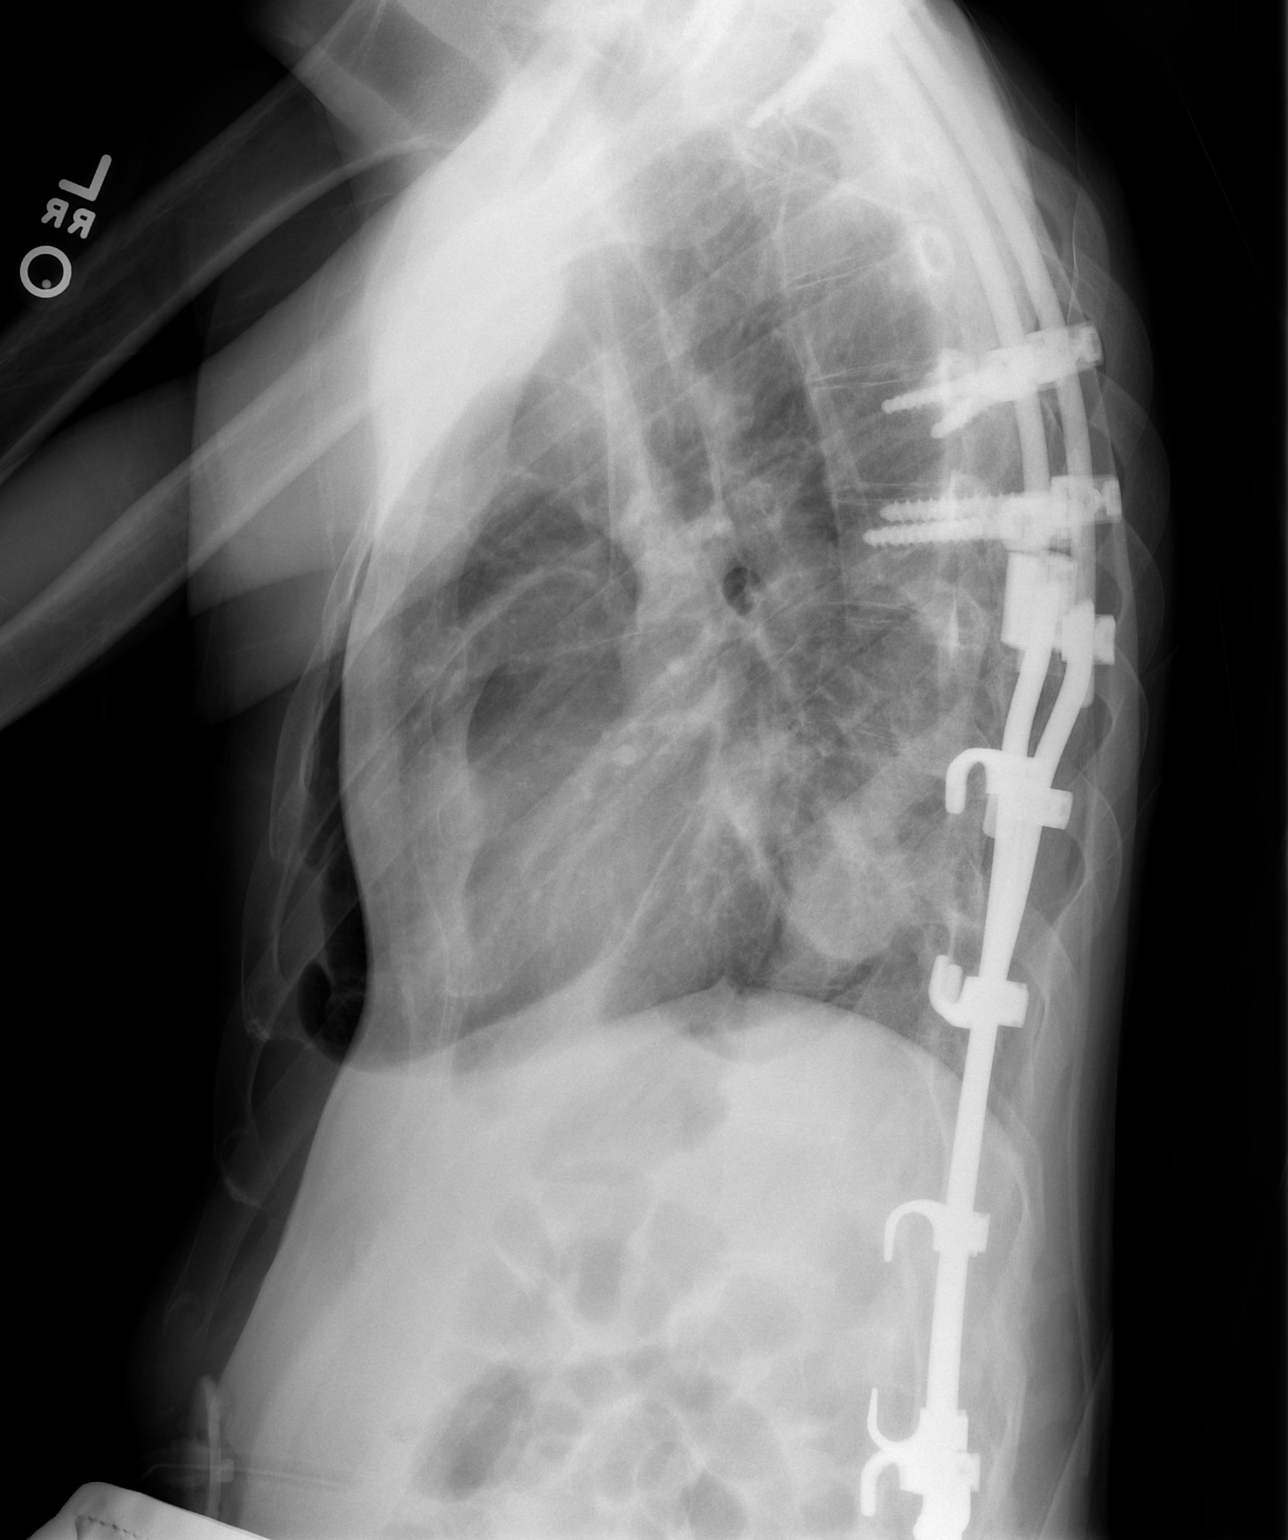

[2 of 2 positions shown; findings below may reference images not displayed]

FINDINGS: Heart size is normal. Persistent right lower lobe airspace
opacification is evident. A right pleural effusion is similar to the
prior exam. The left lung is clear. Extensive spinal surgery is
again noted.
IMPRESSION: 1. Persistent right lower lobe airspace opacification. Given the
persistence of disease, CT of the chest with contrast would be
useful further evaluation in the appropriate clinical setting.
2. Persistent right pleural effusion.
3. Right IJ line is stable.

## 2015-08-02 NOTE — Telephone Encounter (Signed)
Tell her I am very sorry just seeing this now. She does not need to come back in today unless he needs something urgent. She can have refills she needs and just try and reschedule them for some time in next 8 weeks

## 2015-08-02 NOTE — Telephone Encounter (Signed)
Per notes she was here with pt today for 10:30am appt and Dr. Charlett Blake was running behind. They left at 11:34am without being seen because she was not feeling well. She just 11:54am from [phone] to see if she should come back in.

## 2015-08-05 NOTE — Telephone Encounter (Signed)
Spoke to his mother and informed of PCP instructions.  She was fine (actually relaxing on the beach at [facility]).  She stated they have rescheduled for next week August 15, 2015.   Nothing needed at this time.

## 2015-08-15 ENCOUNTER — Encounter: Payer: Self-pay | Admitting: Family Medicine

## 2015-08-15 ENCOUNTER — Ambulatory Visit (INDEPENDENT_AMBULATORY_CARE_PROVIDER_SITE_OTHER): Payer: Medicare Other | Admitting: Family Medicine

## 2015-08-15 VITALS — BP 112/72 | HR 78 | Temp 98.1°F | Wt 93.0 lb

## 2015-08-15 DIAGNOSIS — D508 Other iron deficiency anemias: Secondary | ICD-10-CM

## 2015-08-15 DIAGNOSIS — J690 Pneumonitis due to inhalation of food and vomit: Secondary | ICD-10-CM

## 2015-08-15 DIAGNOSIS — R1319 Other dysphagia: Secondary | ICD-10-CM

## 2015-08-15 DIAGNOSIS — Z23 Encounter for immunization: Secondary | ICD-10-CM

## 2015-08-15 DIAGNOSIS — R32 Unspecified urinary incontinence: Secondary | ICD-10-CM | POA: Diagnosis not present

## 2015-08-15 DIAGNOSIS — E860 Dehydration: Secondary | ICD-10-CM

## 2015-08-15 DIAGNOSIS — G243 Spasmodic torticollis: Secondary | ICD-10-CM

## 2015-08-15 DIAGNOSIS — R634 Abnormal weight loss: Secondary | ICD-10-CM

## 2015-08-15 DIAGNOSIS — G809 Cerebral palsy, unspecified: Secondary | ICD-10-CM

## 2015-08-15 DIAGNOSIS — E43 Unspecified severe protein-calorie malnutrition: Secondary | ICD-10-CM

## 2015-08-15 DIAGNOSIS — E785 Hyperlipidemia, unspecified: Secondary | ICD-10-CM | POA: Diagnosis not present

## 2015-08-15 DIAGNOSIS — F418 Other specified anxiety disorders: Secondary | ICD-10-CM

## 2015-08-15 DIAGNOSIS — Z931 Gastrostomy status: Secondary | ICD-10-CM

## 2015-08-15 DIAGNOSIS — Z0000 Encounter for general adult medical examination without abnormal findings: Secondary | ICD-10-CM | POA: Diagnosis not present

## 2015-08-15 DIAGNOSIS — R1314 Dysphagia, pharyngoesophageal phase: Secondary | ICD-10-CM

## 2015-08-15 DIAGNOSIS — R1310 Dysphagia, unspecified: Secondary | ICD-10-CM

## 2015-08-15 DIAGNOSIS — E0580 Other thyrotoxicosis without thyrotoxic crisis or storm: Secondary | ICD-10-CM

## 2015-08-15 MED ORDER — OLANZAPINE 2.5 MG PO TABS: 2.5000 mg | ORAL_TABLET | Freq: Two times a day (BID) | ORAL | Status: DC | PRN

## 2015-08-15 NOTE — Patient Instructions (Signed)

## 2015-08-15 NOTE — Progress Notes (Signed)
Pre visit review using our clinic review tool, if applicable. No additional management support is needed unless otherwise documented below in the visit note. 

## 2015-08-16 LAB — LIPID PANEL
Cholesterol: 105 mg/dL (ref 0–200)
HDL: 33.4 mg/dL — ABNORMAL LOW (ref >39.00)
LDL Cholesterol: 50 mg/dL (ref 0–99)
NonHDL: 71.73
Total CHOL/HDL Ratio: 3
Triglycerides: 109 mg/dL (ref 0.0–149.0)
VLDL: 21.8 mg/dL (ref 0.0–40.0)

## 2015-08-22 IMAGING — CT CT CHEST W/ CM
2 of 3 series · 15 of 36 positions shown, 18 images · IV contrast (APPLIED)
Comparison: 04/19/2006 ; correlation chest radiograph

CLINICAL DATA: Persistent RIGHT lower lobe opacification, back
cough for few months secondary to aspiration, history cerebral
palsy, GERD, esophagitis

EXAM:
CT CHEST WITH CONTRAST
TECHNIQUE: Multidetector CT imaging of the chest was performed during
intravenous contrast administration. Sagittal and coronal MPR images
reconstructed from axial data set.
CONTRAST:  80mL OMNIPAQUE IOHEXOL 300 MG/ML  SOLN IV

[Series 2: chest 5.0 b31f · axial · 0.64mm/px · z∈[+1013,+1253]mm · 12 of 58 slices shown, 15 images]
[im 5/58  mediastinal]
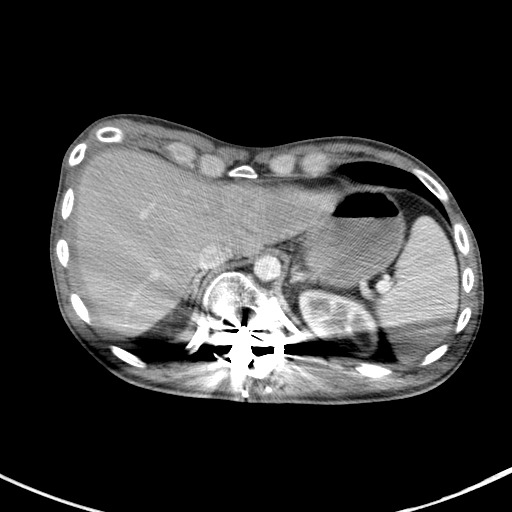
[im 5/58  lung]
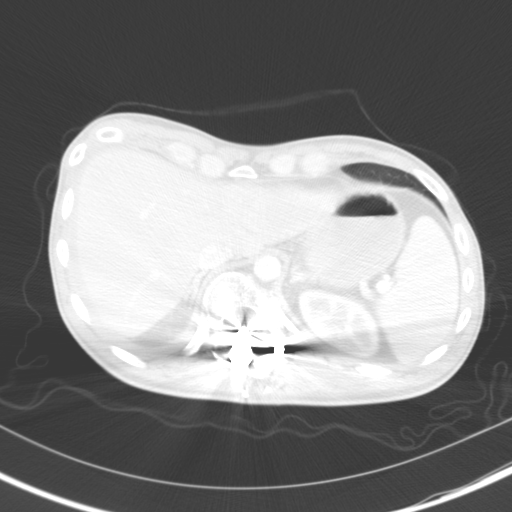
[im 9/58  lung]
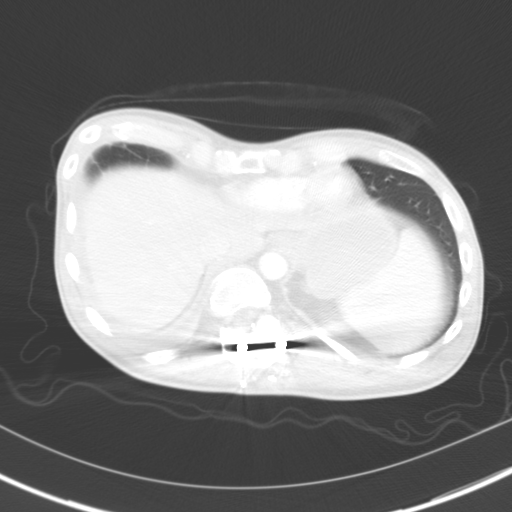
[im 13/58  lung]
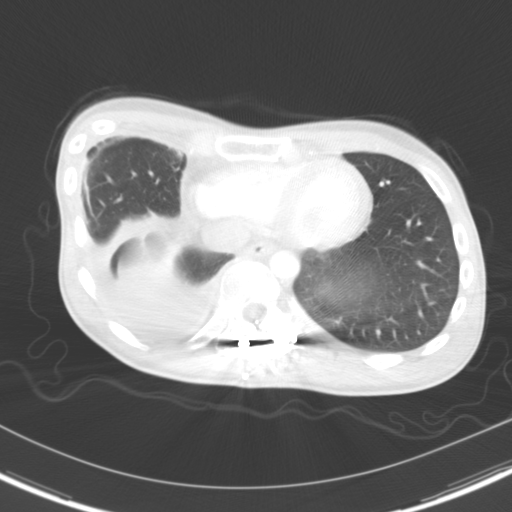
[im 17/58  lung]
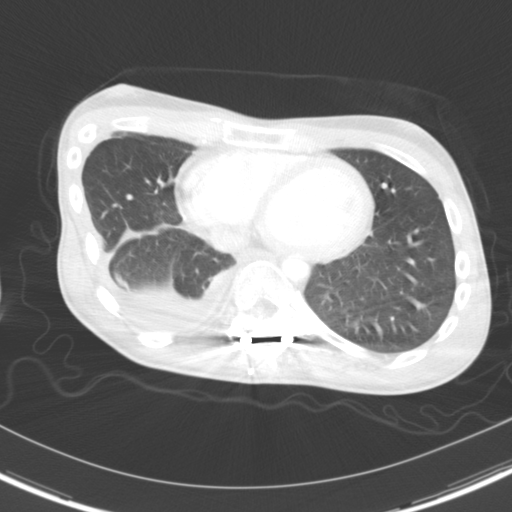
[im 22/58  mediastinal]
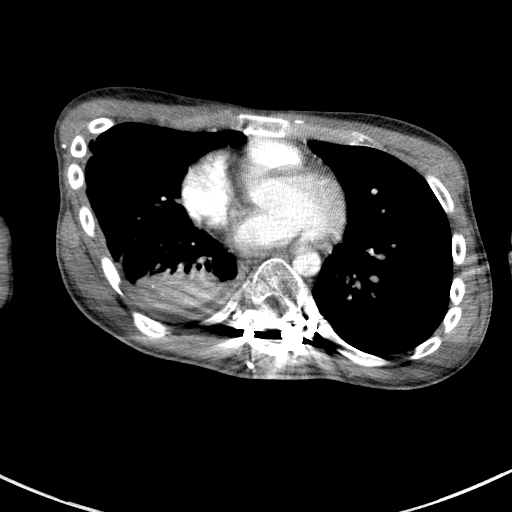
[im 22/58  lung]
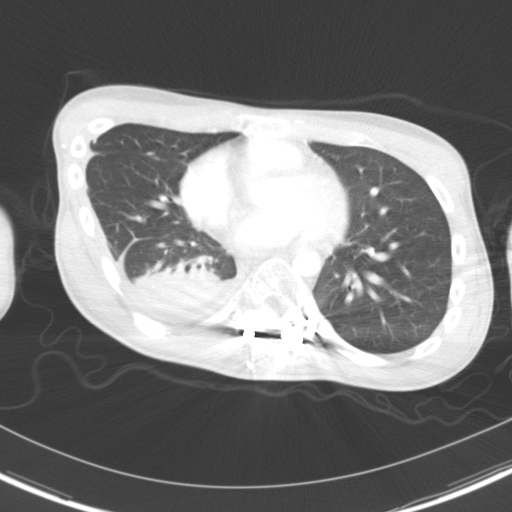
[im 26/58  lung]
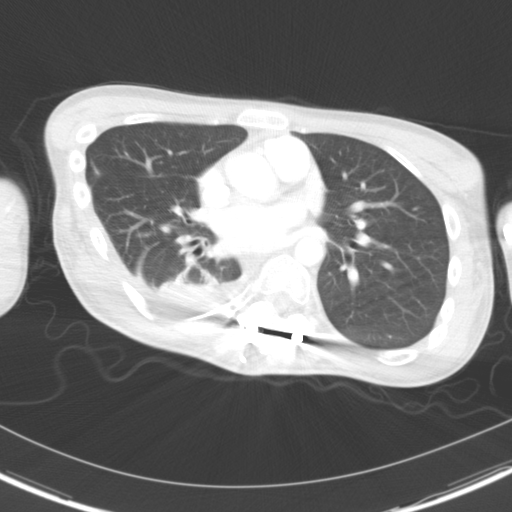
[im 32/58  lung]
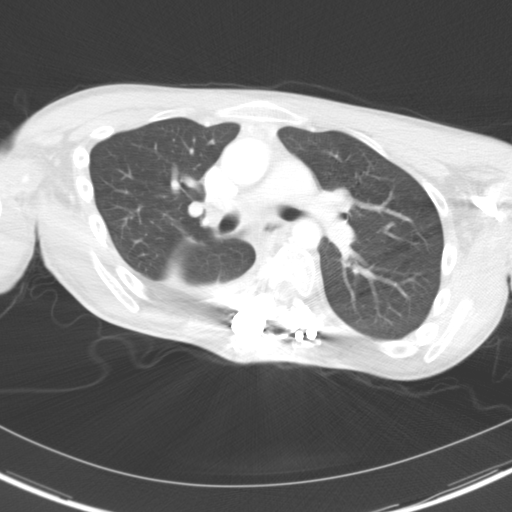
[im 36/58  lung]
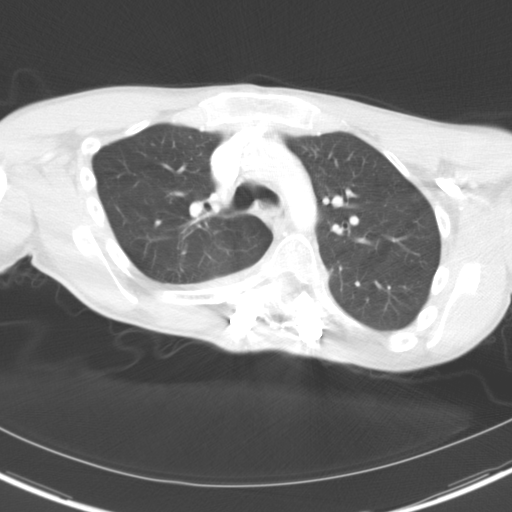
[im 41/58  mediastinal]
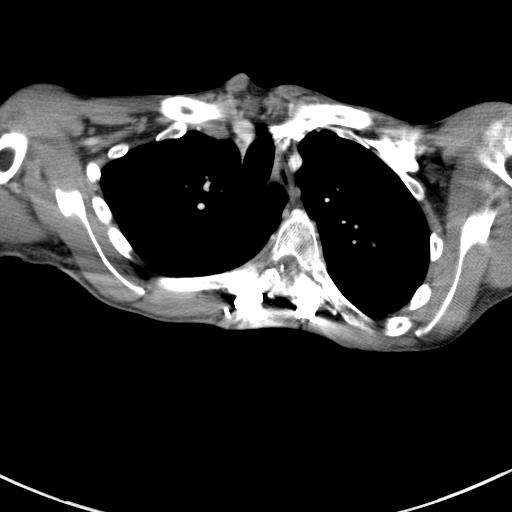
[im 41/58  lung]
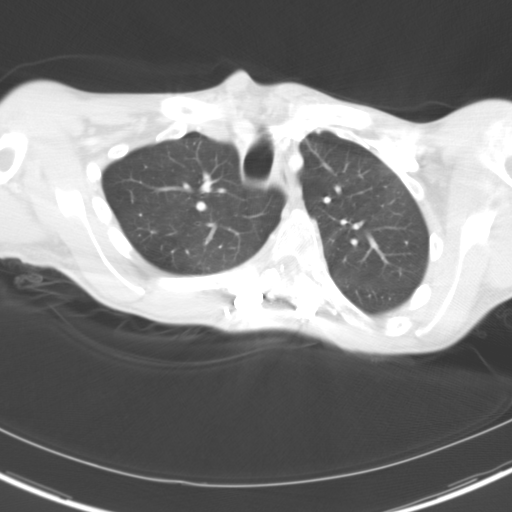
[im 45/58  lung]
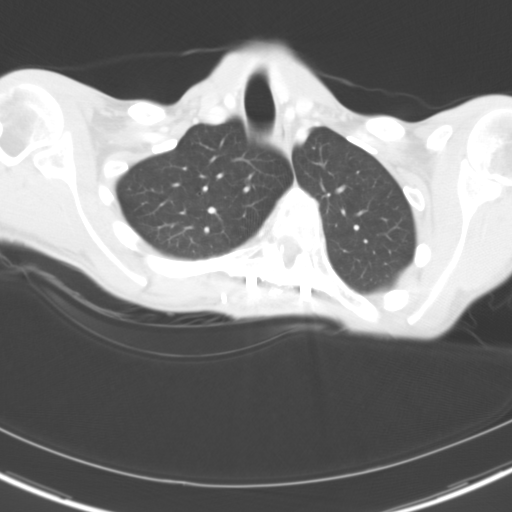
[im 49/58  lung]
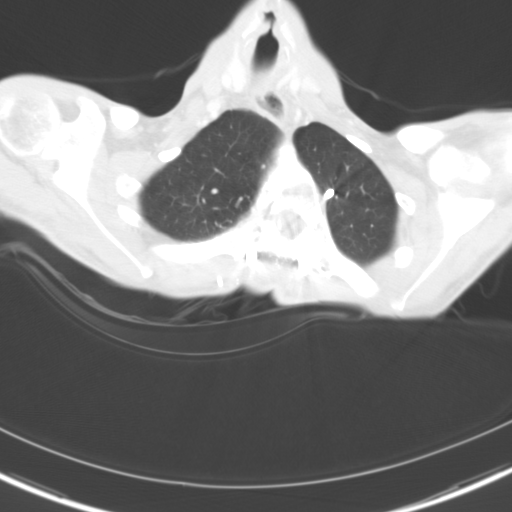
[im 53/58  lung]
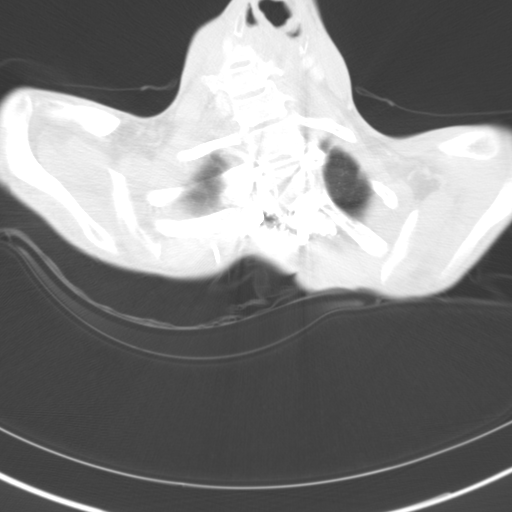

[Series 6: chest 3.0 coronal · coronal · 0.59mm/px · 3 of 69 slices shown]
[im 14/69  lung]
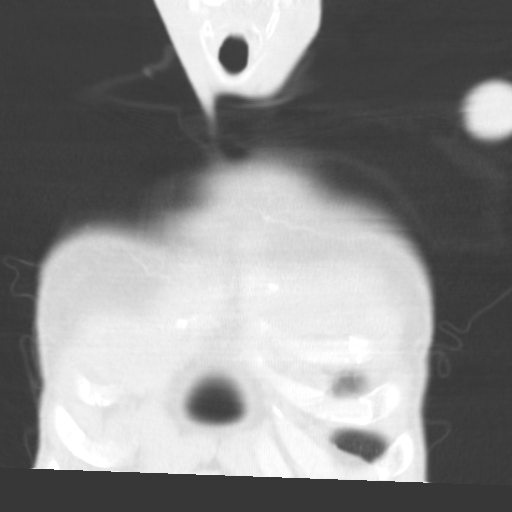
[im 28/69  lung]
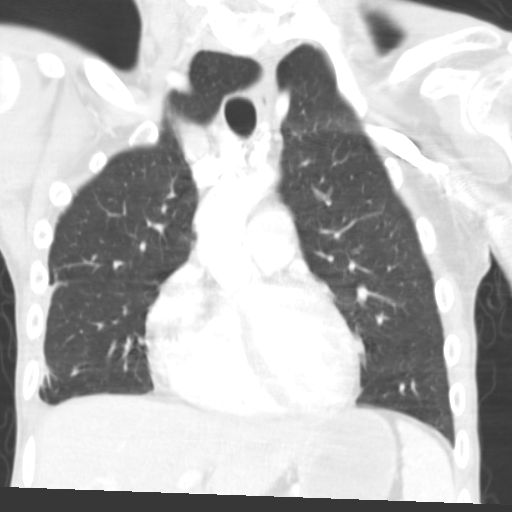
[im 41/69  lung]
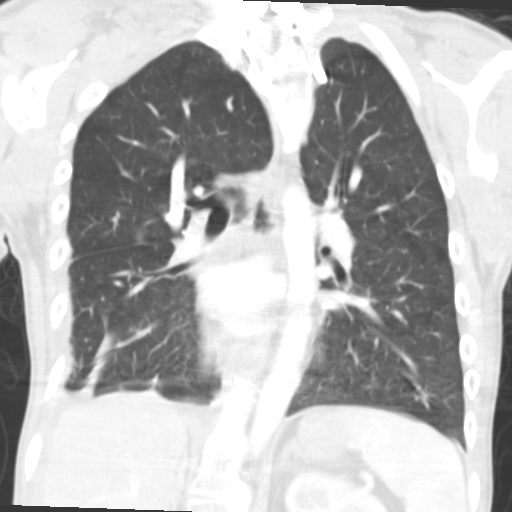

[15 of 36 positions shown; findings below may reference images not displayed]

FINDINGS: Extensive beam hardening artifacts secondary to spinal fixation
hardware as a result of scoliosis.

Thoracic vascular structures grossly patent on nondedicated exam.

No thoracic adenopathy.

Visualized upper abdomen normal.

Pleural effusion identified at posterior inferior RIGHT hemi thorax
with question mild pleural thickening.

Calcified granuloma RIGHT lower lobe.

Rounded opacity in volume loss in the RIGHT lower lobe may represent
rounded atelectasis or less likely rounded pneumonia.

No definite mass identified.

Remaining lungs clear.

No pneumothorax or LEFT pleural effusion.

Bones demineralized
IMPRESSION: Volume loss in the RIGHT lower lobe with rounding of vessels
suggesting rounded atelectasis, less likely a round pneumonia ;
radiographic followup until resolution recommended to exclude
underlying abnormalities.

Pleural fluid collection at posterior inferior RIGHT hemi thorax
with questionable pleural thickening, potentially reactive but
cannot exclude infected collections/empyema ; if this fails to
resolve or enlarges this may require aspiration to exclude
infection.

## 2015-08-25 ENCOUNTER — Encounter: Payer: Self-pay | Admitting: Family Medicine

## 2015-08-25 DIAGNOSIS — E785 Hyperlipidemia, unspecified: Secondary | ICD-10-CM

## 2015-08-25 DIAGNOSIS — Z0000 Encounter for general adult medical examination without abnormal findings: Secondary | ICD-10-CM

## 2015-08-25 HISTORY — DX: Encounter for general adult medical examination without abnormal findings: Z00.00

## 2015-08-25 HISTORY — DX: Hyperlipidemia, unspecified: E78.5

## 2015-08-25 NOTE — Assessment & Plan Note (Signed)
Following with [facility] neuropsychiatry, Dr Agapito Games. Doing better at present on Paroxetine.

## 2015-08-25 NOTE — Assessment & Plan Note (Signed)
Follows with PT/OT at [facility]

## 2015-08-25 NOTE — Assessment & Plan Note (Signed)
Is doing better with hi protein feeds

## 2015-08-25 NOTE — Assessment & Plan Note (Signed)
Continues to follow with [facility] needs to continue same care

## 2015-08-25 NOTE — Progress Notes (Signed)
Subjective:    Patient ID: [name], male    DOB: Mar 19, 1986, 29 y.o.   MRN: [id number]  Chief Complaint  Patient presents with  . Medicare Wellness    HPI Patient is in today for annual Medicare wellness exam. He is coming by his mother. He relies on her 400% of his care secondary to his significant cerebral palsy. He is wheelchair-bound and dependent on a gastrotomy tube and incontinence supplies. They offer no acute complaints. They report he is much less agitated since starting proximal 13. They report he's had less recurrent pulmonary disease since switching to largely nothing by mouth status. No new complaints. Does have some neck spasm at times. No complaints today. Denies CP/palp/SOB/HA/congestion/fevers/GI or GU c/o. Taking meds as prescribed  Past Medical History  Diagnosis Date  . Cerebral palsy ([place])   . GERD (gastroesophageal reflux disease)   . Hyperthyroidism   . Incontinence of feces   . Palpitations   . Esophagitis 2011  . Dehydration 11/22/2013  . Loss of weight 08/28/2014  . Depression with anxiety 08/01/2010    Qualifier: Diagnosis of  By: Nelson-Smith CMA (AAMA), Dottie    . Gastrostomy in place [facility]) 08/31/2013  . Medicare annual wellness visit, subsequent 08/25/2015  . Hyperlipidemia, mild 08/25/2015    Past Surgical History  Procedure Laterality Date  . Spine surgery  ,11/20/2010, 2011    for correction of severe contracturing spinal kyphosis.   . Eye surgery    . Ears tubes    . Hamstring released      to treat contractures.   . Baclofen trial    . Baslofen pump implant    . Spinal fusion    . G-tube insert  August 2006  . Spinal fusioncorrect 106 degree kyphosis    . Spinal fusion to correct 70 degree kyphosis  11-2010  . Tonsillectomy    . Peg placement  10/21/2011    Procedure: PERCUTANEOUS ENDOSCOPIC GASTROSTOMY (PEG) REPLACEMENT;  Surgeon: [name], MD;  Location: [facility] ENDOSCOPY;  Service: Endoscopy;  Laterality: N/A;  . Peg  placement N/A 06/13/2013    Procedure: PERCUTANEOUS ENDOSCOPIC GASTROSTOMY (PEG) REPLACEMENT;  Surgeon: [name], MD;  Location: [facility] ENDOSCOPY;  Service: Endoscopy;  Laterality: N/A;  . Hip surgery      x2 , side   . Flexible sigmoidoscopy N/A 09/07/2014    Procedure: FLEXIBLE SIGMOIDOSCOPY;  Surgeon: [name], MD;  Location: [facility] ENDOSCOPY;  Service: Endoscopy;  Laterality: N/A;    Family History  Problem Relation Age of Onset  . Asthma Mother   . Hyperlipidemia Mother   . COPD Mother   . Other Mother     bronchial stasis/ABPA  . Cancer Maternal Grandmother 62    breast  . Hyperlipidemia Maternal Grandmother   . Hypertension Maternal Grandmother   . Cancer Maternal Grandfather     prostate  . Heart disease Paternal Grandfather     CHF  . Osteoporosis Paternal Grandmother   . Arthritis Paternal Grandmother     rheumatoid    Social History   Social History  . Marital Status: Single    Spouse Name: N/A  . Number of Children: 0  . Years of Education: N/A   Occupational History  . disbaled    Social History Main Topics  . Smoking status: Never Smoker   . Smokeless tobacco: Never Used  . Alcohol Use: No  . Drug Use: No  . Sexual Activity: No   Other  Topics Concern  . Not on file   Social History Narrative    Outpatient Prescriptions Prior to Visit  Medication Sig Dispense Refill  . AMBULATORY NON FORMULARY MEDICATION Medication Name: MIC gastrostomy/bolus feeding tube 24 French Part number 0110-24. #2 and 10 cc lurer lock syringe #2 Dx: 4 Device 2  . bacitracin 500 UNIT/GM ointment Apply 1 application topically 2 (two) times daily. 30 g 1  . clotrimazole-betamethasone (LOTRISONE) cream Apply 1 application topically 2 (two) times daily. 45 g 1  . dantrolene (DANTRIUM) 50 MG capsule TAKE 1 CAPSULE BY MOUTH 3 TIMES A DAY. 90 capsule 4  . diazepam (VALIUM) 2 MG tablet TAKE 1 TO 2 TABLETS AT BEDTIME. 60 tablet 2  . diphenoxylate-atropine (LOMOTIL) 2.5-0.025 MG  per tablet Take 1 tablet by mouth 4 (four) times daily as needed for diarrhea or loose stools. 8 tablet 0  . divalproex (DEPAKOTE SPRINKLE) 125 MG capsule Take 2 capsules in morning, 5 capsules at bedtime (Patient taking differently: 250-625 mg. Take 2 capsules in morning, 5 capsules at bedtime) 210 capsule 4  . Feeding Tubes - Bags (FLEXIFLO FEEDING BAG/PUMP) MISC 1 Units by Does not apply route continuous. 1 each 30  . Feeding Tubes - Pump MISC 1 Units by Does not apply route continuous. 1 each 0  . Feeding Tubes - Sets (KANGAROO EPUMP SET 1000ML) MISC 30 day supply of Kangaroo Joey PUmp set with flush bag 1 each 5  . Incontinence Supplies (BARD LEG BAG STRAPS/FABRIC) MISC Bard Dispoz-a-Bag leg bag w/flip flo Valve, sterile, w/Gabric strap, 18" extension tubing 19 oz  Item #[id number] 2 each 6  . Incontinence Supply Disposable (PREVAIL BREEZERS MEDIUM) MISC pkg of 16- size medium 32" to 44"  Breathable cloth-like outer fabric (can't use the plastic outer surgace  Item # [id number]/[id number] 16 each 6  . LORazepam (ATIVAN) 1 MG tablet Take one tablet in evenings as needed for insomnia. 30 tablet 1  . metoprolol tartrate (LOPRESSOR) 25 MG tablet Take 25 mg by mouth 2 (two) times daily as needed. Palpitaitons, tachycardia    . NON FORMULARY Bard Leg Bag Extension tubing w/Connector 18", Sterile, latex-free  Item# [id number]    . NON FORMULARY Colorplast Freedom Cath Latex Self-Adhering Male External Catheter 42mm Diameter Intermediate  Item# [id number]    . NONFORMULARY OR COMPOUNDED ITEM [employer] REF [id number] - Kangaroo Joey Pump Set with Flush Bags - 1000 mL 1 each 0  . Nutritional Supplements (FEEDING SUPPLEMENT, JEVITY 1.5 CAL,) LIQD Run tube feed at 70 mL per hour over 17 hours. 35550 mL 11  . nystatin cream (MYCOSTATIN) Apply 1 application topically 2 (two) times daily as needed for dry skin. 30 g 1  . OLANZapine (ZYPREXA) 5 MG tablet Take 5 mg by mouth at bedtime.    Earney Navy Bicarbonate  (ZEGERID) 20-1100 MG CAPS capsule Take 1 capsule by mouth daily before breakfast.    . Ostomy Supplies (PROTECTIVE BARRIER WIPES) MISC 1-1/4" X 3"  Item #[id number] 75 each 6  . oxybutynin (DITROPAN) 5 MG/5ML syrup Take 5 mg by mouth 2 (two) times daily.     Marland Kitchen PARoxetine (PAXIL) 10 MG tablet Take 1 tablet (10 mg total) by mouth daily.    Marland Kitchen PRESCRIPTION MEDICATION Colorplast Freedom Cath Latex Self-Adhering Male External Catheter 67mm Diameter Intermediate  Item# [id number]    . sucralfate (CARAFATE) 1 G tablet 1 tablet twice daily via PEG 60 tablet 1  . nystatin (MYCOSTATIN) 100000 UNIT/ML suspension  No facility-administered medications prior to visit.    Allergies  Allergen Reactions  . Ambien [Zolpidem Tartrate] Nausea Only  . Codeine Other (See Comments)    Makes patient too active after a few days.  . Baclofen Anxiety  . Sulfonamide Derivatives Rash    Review of Systems  Constitutional: Negative for fever, chills and malaise/fatigue.  HENT: Negative for congestion and hearing loss.   Eyes: Negative for discharge.  Respiratory: Negative for cough, sputum production and shortness of breath.   Cardiovascular: Negative for chest pain, palpitations and leg swelling.  Gastrointestinal: Negative for heartburn, nausea, vomiting, abdominal pain, diarrhea, constipation and blood in stool.  Genitourinary: Negative for dysuria, urgency, frequency and hematuria.  Musculoskeletal: Positive for neck pain. Negative for myalgias, back pain and falls.  Skin: Negative for rash.  Neurological: Negative for dizziness, sensory change, loss of consciousness, weakness and headaches.  Endo/Heme/Allergies: Negative for environmental allergies. Does not bruise/bleed easily.  Psychiatric/Behavioral: Negative for depression and suicidal ideas. The patient is not nervous/anxious and does not have insomnia.        Objective:    Physical Exam  Constitutional: He is oriented to person, place, and time.  He appears well-developed and well-nourished. No distress.  In wheelchair, with Cerebral palsy and significant muscle wasting and contractures throughout.   HENT:  Head: Normocephalic and atraumatic.  Eyes: Conjunctivae are normal.  Neck: Neck supple. No thyromegaly present.  Cardiovascular: Normal rate, regular rhythm and normal heart sounds.   No murmur heard. Pulmonary/Chest: Effort normal and breath sounds normal. No respiratory distress. He has no wheezes.  Abdominal: Soft. Bowel sounds are normal. He exhibits no mass. There is no tenderness.  Musculoskeletal: He exhibits no edema.  Lymphadenopathy:    He has no cervical adenopathy.  Neurological: He is alert and oriented to person, place, and time.  Skin: Skin is warm and dry.  Psychiatric: He has a normal mood and affect. His behavior is normal.    BP 112/72 mmHg  Pulse 78  Temp(Src) 98.1 F (36.7 C) (Oral)  Wt 93 lb (42.185 kg)  SpO2 93% Wt Readings from Last 3 Encounters:  08/15/15 93 lb (42.185 kg)  02/14/15 92 lb (41.731 kg)  02/12/15 92 lb (41.731 kg)     Lab Results  Component Value Date   WBC 7.3 05/07/2015   HGB 14.7 05/07/2015   HCT 44.2 05/07/2015   PLT 200.0 05/07/2015   GLUCOSE 72 05/07/2015   CHOL 105 08/15/2015   TRIG 109.0 08/15/2015   HDL 33.40* 08/15/2015   LDLCALC 50 08/15/2015   ALT 13 05/07/2015   AST 16 05/07/2015   NA 141 05/07/2015   K 4.2 05/07/2015   CL 106 05/07/2015   CREATININE 0.48 05/07/2015   BUN 10 05/07/2015   CO2 25 05/07/2015   TSH 2.51 05/07/2015   HGBA1C 5.0 04/29/2007    Lab Results  Component Value Date   TSH 2.51 05/07/2015   Lab Results  Component Value Date   WBC 7.3 05/07/2015   HGB 14.7 05/07/2015   HCT 44.2 05/07/2015   MCV 89.1 05/07/2015   PLT 200.0 05/07/2015   Lab Results  Component Value Date   NA 141 05/07/2015   K 4.2 05/07/2015   CO2 25 05/07/2015   GLUCOSE 72 05/07/2015   BUN 10 05/07/2015   CREATININE 0.48 05/07/2015   BILITOT 0.4  05/07/2015   ALKPHOS 72 05/07/2015   AST 16 05/07/2015   ALT 13 05/07/2015   PROT 8.1 05/07/2015  ALBUMIN 4.2 05/07/2015   CALCIUM 9.5 05/07/2015   ANIONGAP 11 09/18/2014   GFR 215.88 05/07/2015   Lab Results  Component Value Date   CHOL 105 08/15/2015   Lab Results  Component Value Date   HDL 33.40* 08/15/2015   Lab Results  Component Value Date   LDLCALC 50 08/15/2015   Lab Results  Component Value Date   TRIG 109.0 08/15/2015   Lab Results  Component Value Date   CHOLHDL 3 08/15/2015   Lab Results  Component Value Date   HGBA1C 5.0 04/29/2007       Assessment & Plan:   Problem List Items Addressed This Visit    Urinary incontinence    Continues to follow with [facility] needs to continue same care      Thyrotoxicosis   Protein-calorie malnutrition, severe ([place])    Is doing better with hi protein feeds      Medicare annual wellness visit, subsequent    Patient denies any difficulties at home. Relies on mother for all ADLs, depression or falls. No recent changes to vision or hearing. Is UTD with immunizations. Is UTD with screening. Discussed Advanced Directives, mother encouraged to get HCP and LW together for patient in case she is incapacitated. Encouraged heart healthy diet and adequate sleep. Follows with Dr Raquel James of [facility], Dr McDiarmid of [facility], Dr Agapito Games of [facility] neuropsych and PT/ST at [facility]. Labs ordered and reviewed.  See problem list for risk factors See AVS for schedule for preventative health measures      Loss of weight   Infantile cerebral palsy ([address])    Completely dependent on his mother for care, doing well at home      Hyperlipidemia, mild    Encouraged heart healthy diet, increase exercise, avoid trans fats, consider a krill oil cap daily      Gastrostomy in place [facility])    Had a recent episode of it getting jarred loose but was replaced. Did not like the new G-J tube has replaced with olod  tube      Esophageal dysphagia (Chronic)    Essentially maintained NPO due to recurrent issues this past year including pneumonia      Dysphagia, pharyngoesophageal phase    Follows with [facility] doing better.      Depression with anxiety    Following with [facility] neuropsychiatry, Dr Agapito Games. Doing better at present on Paroxetine.       Dehydration   Cervical dystonia    Follows with PT/OT at [facility]      RESOLVED: ASPIRATION PNEUMONIA   Anemia    Other Visit Diagnoses    Encounter for immunization    -  Primary    Dyslipidemia        Relevant Orders    Lipid panel (Completed)        I have discontinued Mr. Gilkes nystatin. I am also having him start on OLANZapine. Additionally, I am having him maintain his nystatin cream, divalproex, sucralfate, clotrimazole-betamethasone, KANGAROO EPUMP SET 1000ML, Feeding Tubes - Pump, FLEXIFLO FEEDING BAG/PUMP, oxybutynin, Omeprazole-Sodium Bicarbonate, OLANZapine, NON FORMULARY, NON FORMULARY, PRESCRIPTION MEDICATION, Protective Barrier Wipes, PREVAIL BREEZERS MEDIUM, BARD LEG BAG STRAPS/FABRIC, metoprolol tartrate, bacitracin, PARoxetine, AMBULATORY NON FORMULARY MEDICATION, diphenoxylate-atropine, diazepam, NONFORMULARY OR COMPOUNDED ITEM, LORazepam, feeding supplement (JEVITY 1.5 CAL), and dantrolene.  Meds ordered this encounter  Medications  . DISCONTD: OLANZapine (ZYPREXA) 2.5 MG tablet    Sig: Take 2.5 mg by mouth 3 (three) times daily as needed.  Marland Kitchen  OLANZapine (ZYPREXA) 2.5 MG tablet    Sig: Take 1 tablet (2.5 mg total) by mouth 2 (two) times daily as needed.     [name], MD

## 2015-08-25 NOTE — Assessment & Plan Note (Addendum)
Patient denies any difficulties at home. Relies on mother for all ADLs, depression or falls. No recent changes to vision or hearing. Is UTD with immunizations. Is UTD with screening. Discussed Advanced Directives, mother encouraged to get HCP and LW together for patient in case she is incapacitated. Encouraged heart healthy diet and adequate sleep. Follows with Dr Raquel James of [facility], Dr McDiarmid of [facility], Dr Agapito Games of [facility] neuropsych and PT/ST at [facility]. Labs ordered and reviewed.  See problem list for risk factors See AVS for schedule for preventative health measures

## 2015-08-25 NOTE — Assessment & Plan Note (Signed)
Had a recent episode of it getting jarred loose but was replaced. Did not like the new G-J tube has replaced with olod tube

## 2015-08-25 NOTE — Assessment & Plan Note (Signed)
Encouraged heart healthy diet, increase exercise, avoid trans fats, consider a krill oil cap daily 

## 2015-08-25 NOTE — Assessment & Plan Note (Signed)
Completely dependent on his mother for care, doing well at home

## 2015-08-25 NOTE — Assessment & Plan Note (Signed)
Follows with [facility] doing better.

## 2015-08-25 NOTE — Assessment & Plan Note (Signed)
Essentially maintained NPO due to recurrent issues this past year including pneumonia

## 2015-09-03 ENCOUNTER — Encounter: Payer: Self-pay | Admitting: Physical Medicine & Rehabilitation

## 2015-09-03 ENCOUNTER — Encounter: Payer: Medicare Other | Attending: Physical Medicine & Rehabilitation | Admitting: Physical Medicine & Rehabilitation

## 2015-09-03 DIAGNOSIS — K219 Gastro-esophageal reflux disease without esophagitis: Secondary | ICD-10-CM | POA: Insufficient documentation

## 2015-09-03 DIAGNOSIS — R159 Full incontinence of feces: Secondary | ICD-10-CM | POA: Diagnosis not present

## 2015-09-03 DIAGNOSIS — G809 Cerebral palsy, unspecified: Secondary | ICD-10-CM | POA: Diagnosis not present

## 2015-09-03 DIAGNOSIS — R002 Palpitations: Secondary | ICD-10-CM | POA: Diagnosis not present

## 2015-09-03 DIAGNOSIS — E785 Hyperlipidemia, unspecified: Secondary | ICD-10-CM | POA: Insufficient documentation

## 2015-09-03 DIAGNOSIS — Z931 Gastrostomy status: Secondary | ICD-10-CM | POA: Insufficient documentation

## 2015-09-03 DIAGNOSIS — F418 Other specified anxiety disorders: Secondary | ICD-10-CM | POA: Diagnosis not present

## 2015-09-03 DIAGNOSIS — K209 Esophagitis, unspecified: Secondary | ICD-10-CM | POA: Insufficient documentation

## 2015-09-03 DIAGNOSIS — G8250 Quadriplegia, unspecified: Secondary | ICD-10-CM | POA: Diagnosis not present

## 2015-09-03 DIAGNOSIS — E0590 Thyrotoxicosis, unspecified without thyrotoxic crisis or storm: Secondary | ICD-10-CM | POA: Diagnosis not present

## 2015-09-03 MED ORDER — DANTROLENE SODIUM 50 MG PO CAPS: ORAL_CAPSULE | ORAL | Status: DC

## 2015-09-03 NOTE — Progress Notes (Signed)
Subjective:    Patient ID: [name], male    DOB: [birth year], 29 y.o.   MRN: [id number]  HPI   [name] is here for follow up and botox injections today. Mother states that hips are increasingly tight.   Pain Inventory Average Pain 8 Pain Right Now 8 My pain is constant, sharp, burning, stabbing and tingling  In the last 24 hours, has pain interfered with the following? General activity NA Relation with others NA Enjoyment of life NA What TIME of day is your pain at its worst? Daytime, Evening Sleep (in general) Fair  Pain is worse with: NA Pain improves with: therapy/exercise and MAssage Relief from Meds: NA  Mobility how many minutes can you walk? NA use a wheelchair  Function disabled: date disabled NA I need assistance with the following:  feeding, dressing, bathing, toileting, meal prep, household duties and shopping  Neuro/Psych bladder control problems bowel control problems tingling spasms depression anxiety  Prior Studies Any changes since last visit?  no  Physicians involved in your care Any changes since last visit?  no   Family History  Problem Relation Age of Onset  . Asthma Mother   . Hyperlipidemia Mother   . COPD Mother   . Other Mother     bronchial stasis/ABPA  . Cancer Maternal Grandmother 84    breast  . Hyperlipidemia Maternal Grandmother   . Hypertension Maternal Grandmother   . Cancer Maternal Grandfather     prostate  . Heart disease Paternal Grandfather     CHF  . Osteoporosis Paternal Grandmother   . Arthritis Paternal Grandmother     rheumatoid   Social History   Social History  . Marital Status: Single    Spouse Name: N/A  . Number of Children: 0  . Years of Education: N/A   Occupational History  . disbaled    Social History Main Topics  . Smoking status: Never Smoker   . Smokeless tobacco: Never Used  . Alcohol Use: No  . Drug Use: No  . Sexual Activity: No   Other Topics Concern  . None    Social History Narrative   Past Surgical History  Procedure Laterality Date  . Spine surgery  ,11/20/2010, 2011    for correction of severe contracturing spinal kyphosis.   . Eye surgery    . Ears tubes    . Hamstring released      to treat contractures.   . Baclofen trial    . Baslofen pump implant    . Spinal fusion    . G-tube insert  August 2006  . Spinal fusioncorrect 106 degree kyphosis    . Spinal fusion to correct 70 degree kyphosis  11-2010  . Tonsillectomy    . Peg placement  10/21/2011    Procedure: PERCUTANEOUS ENDOSCOPIC GASTROSTOMY (PEG) REPLACEMENT;  Surgeon: [name], MD;  [facility];  Service: Endoscopy;  Laterality: N/A;  . Peg placement N/A 06/13/2013    Procedure: PERCUTANEOUS ENDOSCOPIC GASTROSTOMY (PEG) REPLACEMENT;  Surgeon: [name], MD;  Location[facility];  Service: Endoscopy;  Laterality: N/A;  . Hip surgery      x2 , side   . Flexible sigmoidoscopy N/A 09/07/2014    Procedure: FLEXIBLE SIGMOIDOSCOPY;  Surgeon: [name], MD;  Location: [facility] ENDOSCOPY;  Service: Endoscopy;  Laterality: N/A;   Past Medical History  Diagnosis Date  . Cerebral palsy ([place])   . GERD (gastroesophageal reflux disease)   . Hyperthyroidism   .  Incontinence of feces   . Palpitations   . Esophagitis 2011  . Dehydration 11/22/2013  . Loss of weight 08/28/2014  . Depression with anxiety 08/01/2010    Qualifier: Diagnosis of  By: Nelson-Smith CMA (AAMA), Dottie    . Gastrostomy in place [facility]) 08/31/2013  . Medicare annual wellness visit, subsequent 08/25/2015  . Hyperlipidemia, mild 08/25/2015   There were no vitals taken for this visit.  Opioid Risk Score:   Fall Risk Score:  `1  Depression screen PHQ 2/9  No flowsheet data found.   Review of Systems  Respiratory: Positive for cough.   Gastrointestinal:       Bowel Control Problems  Genitourinary:       Bladder Control Problems Urine Retention  Musculoskeletal:       Spasms   Neurological:       Tingling  Psychiatric/Behavioral: The patient is nervous/anxious.        Depression  All other systems reviewed and are negative.      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      Assessment & Plan:   Botox Injection for spasticity using needle EMG guidance Indication: spastic tetraplegia  Dilution: 100 Units/ml        Total Units Injected: 400 units Indication: Severe spasticity which interferes with ADL,mobility and/or  hygiene and is unresponsive to medication management and other conservative care Informed consent was obtained after describing risks and benefits of the procedure with the patient. This includes bleeding, bruising, infection, excessive weakness, or medication side effects. A REMS form is on file and signed.  Needle: 74mm injectable monopolar needle electrode  Number of units per muscle Hip Adductors: 0 units Quadriceps 100 units each leg, 3 access points each Gastroc/soleus 0 units Hamstrings 100 units each leg, 3 access points each   All injections were done after obtaining appropriate EMG activity and after negative drawback for blood. The patient tolerated the procedure well. Post procedure instructions were given. A followup appointment was made.

## 2015-09-03 NOTE — Patient Instructions (Signed)
PLEASE CALL ME WITH ANY PROBLEMS OR QUESTIONS (#[phone]).  HAVE A GOOD DAY!    

## 2015-09-05 IMAGING — XA IR CONVERT G-TUBE TO G-JTUBE
1 series · 2 of 2 positions shown · non-contrast
Comparison: none

CLINICAL DATA: Aspiration pneumonia, exchange gastrostomy for a
gastrojejunostomy

[Series 300: sp gastr tube convert gastr-jej  per w/f · 2 of 2 slices shown]
[im 1/2]
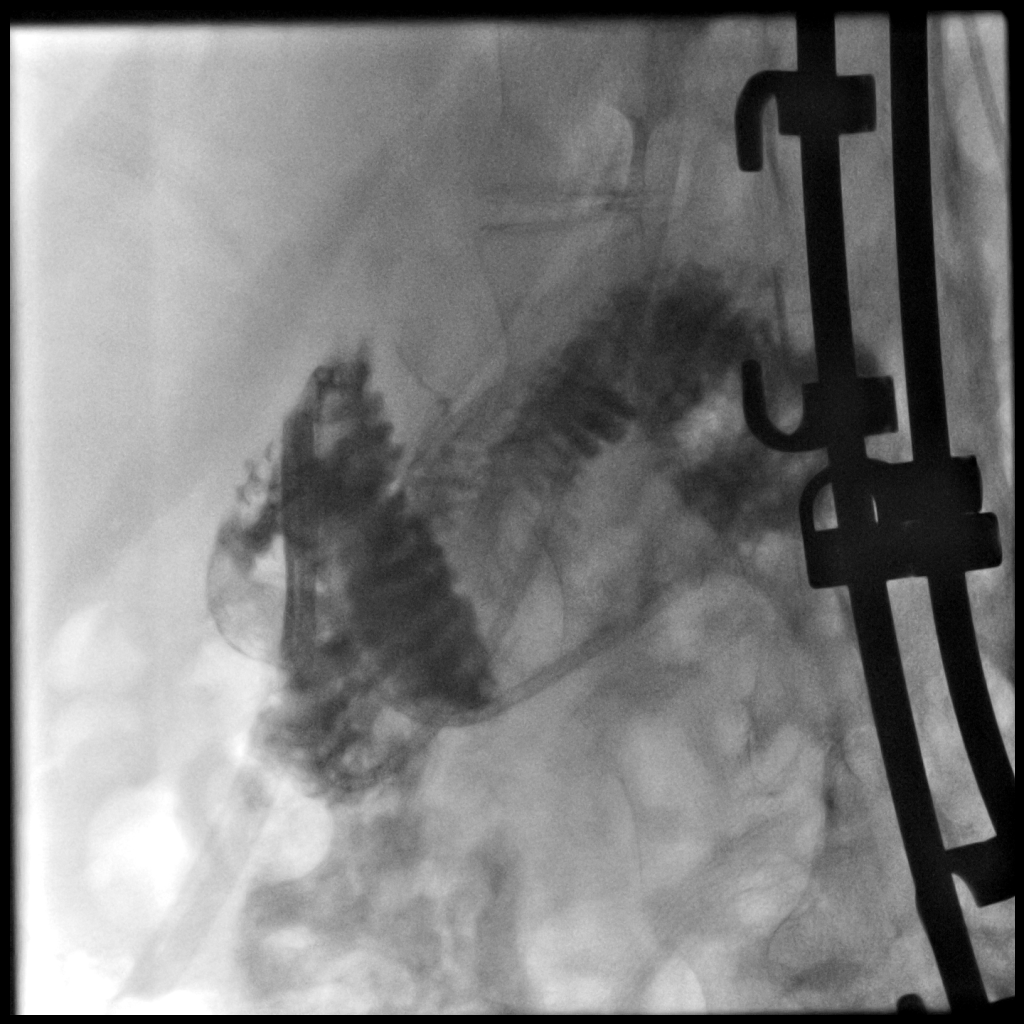
[im 2/2]
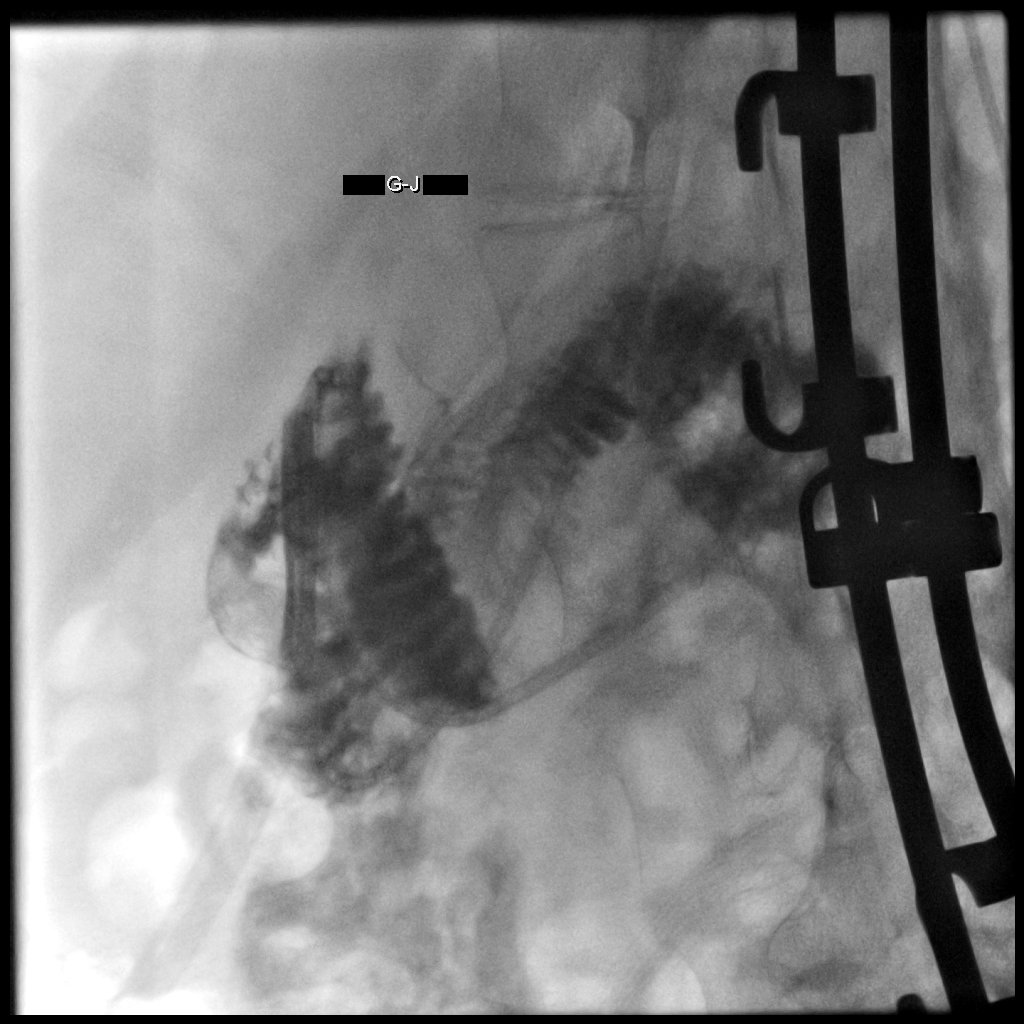

[2 of 2 positions shown; findings below may reference images not displayed]

EXAM:
FLUOROSCOPIC EXCHANGE OF THE EXISTING GASTROSTOMY FOR A 24 FRENCH
GASTROJEJUNOSTOMY

Date:  [DATE] [DATE]

Radiologist:  Dipietro, Moo

Guidance:  Fluoroscopic

FLUOROSCOPY TIME:  6 minutes 24 seconds, 116 mGy

MEDICATIONS AND MEDICAL HISTORY:
None.

ANESTHESIA/SEDATION:
None.

CONTRAST:  10mL OMNIPAQUE IOHEXOL 300 MG/ML  SOLN

COMPLICATIONS:
None immediate

PROCEDURE:
Informed consent was obtained from the patient following explanation
of the procedure, risks, benefits and alternatives. The patient
understands, agrees and consents for the procedure. All questions
were addressed. A time out was performed.

Maximal barrier sterile technique utilized including caps, mask,
sterile gowns, sterile gloves, large sterile drape, hand hygiene,
and Betadine.

The existing gastrostomy was injected confirming position in the
stomach. Catheter was cut and removed over a C2 catheter. Catheter
and guidewire were advanced peripherally into the jejunum. Over a
stiff Glidewire, a new 24 French gastrojejunostomy was advanced with
the distal aspect in the jejunum. Retention balloon inflated with
8cc saline and retracted against the anterior gastric wall. Access
ready for use.
IMPRESSION: Successful conversion of the existing gastrostomy for a 24 French
gastrojejunostomy under fluoroscopy.

## 2015-09-20 IMAGING — CR DG CHEST 2V
1 series · 1 of 1 positions shown · non-contrast
Comparison: 12/27/2014

CLINICAL DATA: RIGHT lower lobe atelectasis, cough, congestion

EXAM:
CHEST  2 VIEW

[w chest lat]
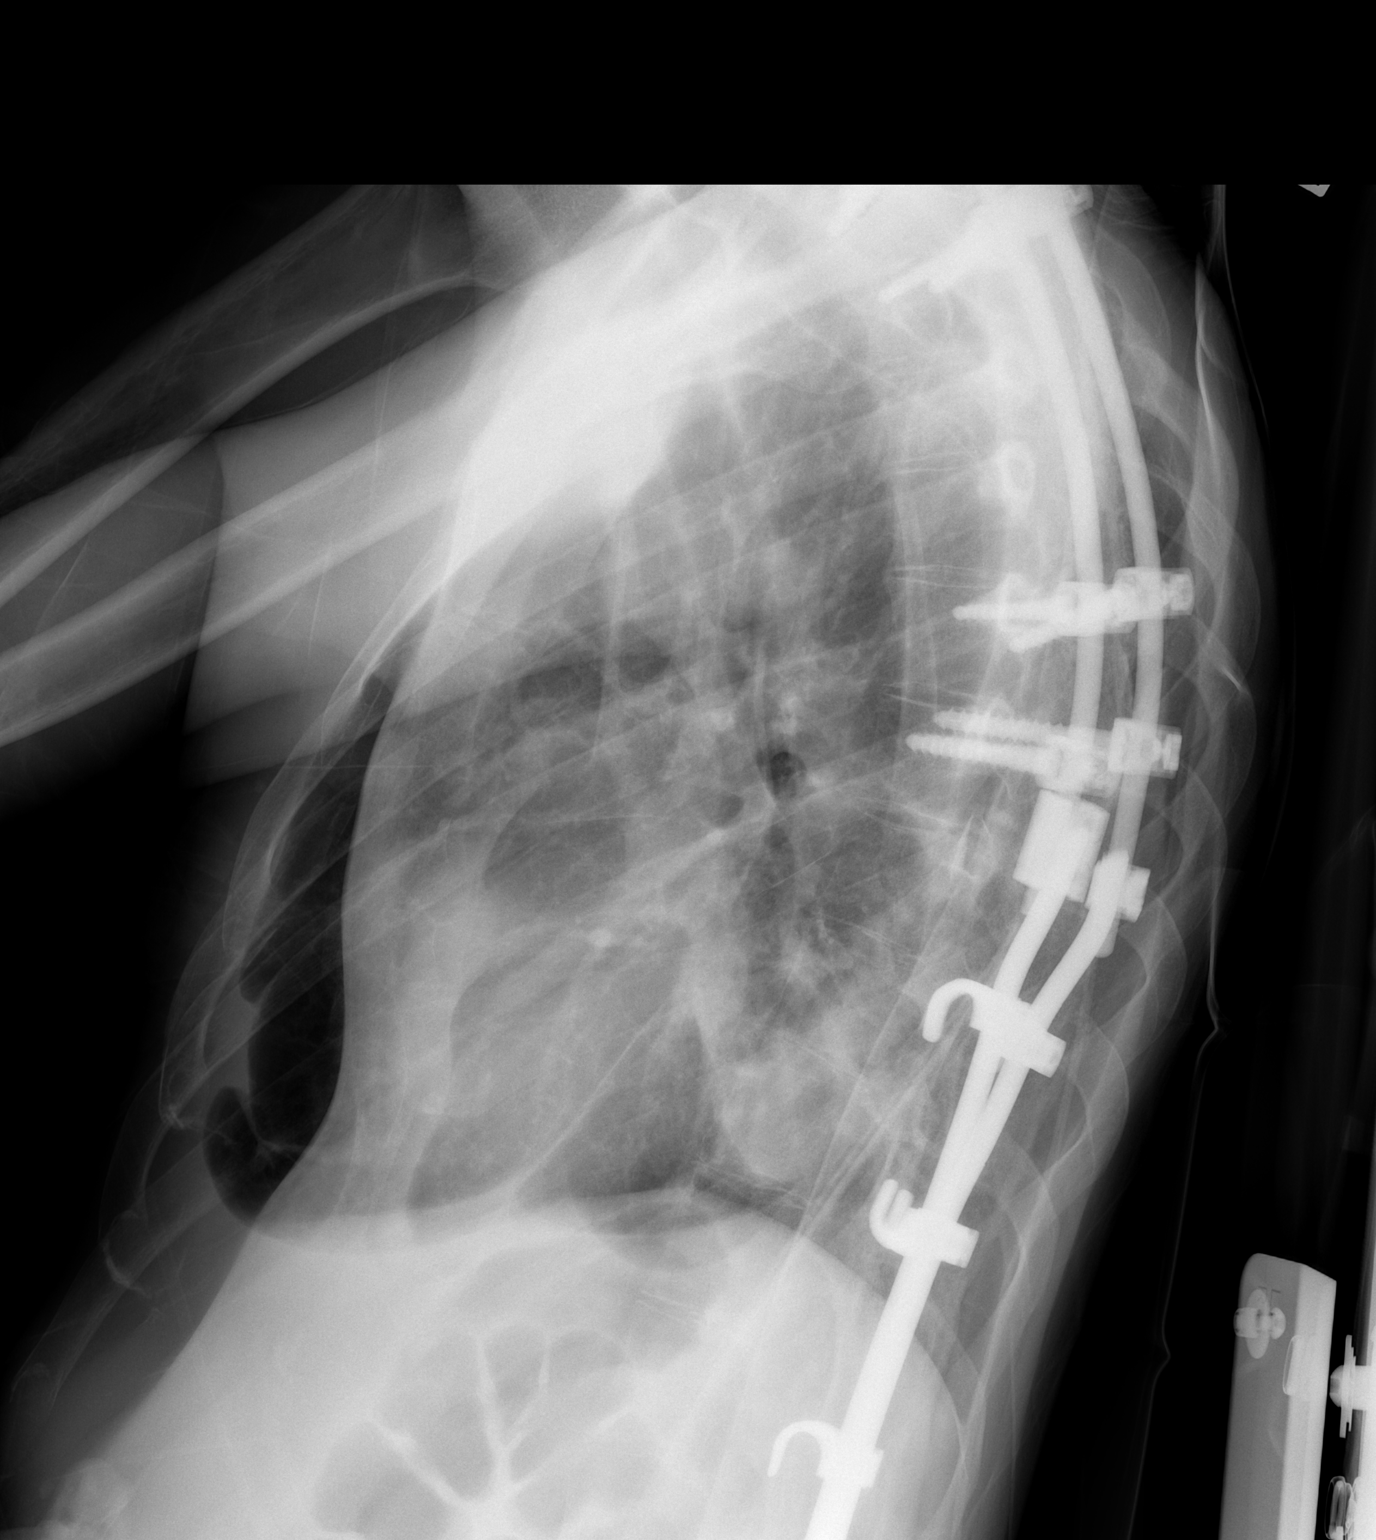

[1 of 1 positions shown; findings below may reference images not displayed]

FINDINGS: Extensive spinal fixation rods cervical, thoracic and lumbar spine.

Old fractured lead projecting over upper medial RIGHT chest.

Upper normal heart size.

Normal mediastinal contours and pulmonary vascularity.

Lungs appear emphysematous with pleural effusion and atelectasis
versus consolidation at RIGHT base.

No infiltrate, pneumothorax or definite mass/nodule.

Bones unremarkable.
IMPRESSION: Emphysematous changes with RIGHT pleural effusion and RIGHT basilar
atelectasis versus consolidation.

## 2015-10-02 DIAGNOSIS — G800 Spastic quadriplegic cerebral palsy: Secondary | ICD-10-CM | POA: Diagnosis not present

## 2015-10-03 DIAGNOSIS — F339 Major depressive disorder, recurrent, unspecified: Secondary | ICD-10-CM | POA: Diagnosis not present

## 2015-10-09 ENCOUNTER — Encounter: Payer: Self-pay | Admitting: Family Medicine

## 2015-10-11 ENCOUNTER — Other Ambulatory Visit: Payer: Self-pay | Admitting: Family Medicine

## 2015-10-14 ENCOUNTER — Ambulatory Visit (INDEPENDENT_AMBULATORY_CARE_PROVIDER_SITE_OTHER): Payer: Medicare Other | Admitting: Internal Medicine

## 2015-10-14 ENCOUNTER — Encounter: Payer: Self-pay | Admitting: Internal Medicine

## 2015-10-14 ENCOUNTER — Telehealth: Payer: Self-pay | Admitting: Family Medicine

## 2015-10-14 VITALS — BP 90/64 | HR 76

## 2015-10-14 DIAGNOSIS — G800 Spastic quadriplegic cerebral palsy: Secondary | ICD-10-CM | POA: Diagnosis not present

## 2015-10-14 DIAGNOSIS — Z931 Gastrostomy status: Secondary | ICD-10-CM

## 2015-10-14 NOTE — Telephone Encounter (Signed)
Caller name: Arita Miss from [facility]  Call back number: [phone] and fax [phone]    Reason for call:  Prescription was faxed over and not legible, in need of clarification. Please follow up

## 2015-10-14 NOTE — Patient Instructions (Signed)
Continue G Tube feedings.

## 2015-10-14 NOTE — Progress Notes (Signed)
Subjective:    Patient ID: [name], male    DOB: 12/17/1985, 29 y.o.   MRN: [id number]  HPI [name] is a 29 year old male with history of cerebral palsy, severe scoliosis/kyphosis with prior spinal fusions, dysphagia now feeding dependent on G-tube is here for follow-up. He was last seen in May 2016. He is here with his mother today. He was having issues with aspiration pneumonia and so his long-standing G-tube was changed to a GJ tube in April 2016. There are multiple flow issues and errors and the tube was changed out on 05/09/2015. The tip was cut off this time to try to prevent clogging. Unfortunately one of his health aides pulled the tube out in early August while he was being turned and rolled in bed. Since this time his mother replaced the [place] tube with a standard G-tube with a balloon. He had previously been using this to. It is been working well. No recent aspiration. He's been gaining weight. He is using Jevity 4 cans per day. With the G-tube he is not able to eat as much as previously with the J-tube. While using the J portion he was having 5 cans of tube feeds per day. Recently bowel movements have been regular and are formed. No longer loose and no diarrhea. No rectal bleeding. He has had a cough which is somewhat chronic but no fevers, chills or again aspiration pneumonias. Both he and his mother are currently happy with current PEG tube situation.  Weight has been stable as of last weight.  [name] got to go to [place] to see the Cubs play over the summer, which was great for him. He is getting ready to see [employer] play [place] at the [place] in a few weeks.  He also recently got a new motorized wheelchair with a tablet. This has been a great help for both he and his family   Review of Systems  as per history of present illness, otherwise negative  Current Medications, Allergies, Past Medical History, Past Surgical History, Family History and Social History were reviewed in [employer] record.     Objective:   Physical Exam Constitutional: Thin male sitting in wheelchair no acute distress HEENT: Normocephalic and atraumatic. Oropharynx is clear and moist. No oropharyngeal exudate. Conjunctivae are normal. No scleral icterus. Neck: Neck supple. Trachea midline. Cardiovascular: Normal rate, regular rhythm and intact distal pulses. Pulmonary/chest: Effort normal and breath sounds normal. No wheezing, rales or rhonchi. Abdominal: Soft, nontender, nondistended. Bowel sounds active throughout. G-tube in place with mild surrounding erythema which is nontender and not firm, bumper turns easily Extremities: no clubbing, cyanosis, or edema Neurological: Alert, makes eye contact, able to verbalize sounds which his mother seems to understand, and tractors in the upper extremities no lower extremity edema Skin: Skin is warm and dry. No rashes noted. Psychiatric: Normal mood and affect. Behavior is normal.     Assessment & Plan:  29 year old male with history of cerebral palsy, severe scoliosis/kyphosis with prior spinal fusions, dysphagia now feeding dependent on G-tube is here for follow-up.  1. 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 The GJ was leading to clogging of the jejunostomy portion and frequent issues with the infuser. Given his lack of aspiration pneumonias, regular bowel movements, lack of nausea or vomiting, we will continue with G-tube feedings. His mother is well versed and able to change these  tubes out every 3-4 months. She's been doing this for years. We will provide a prescription for the G-tube which she purchases through health supply store and she can change out at home. Notify me of any issues with tube feedings or recurrent abdominal pain, aspiration pneumonia etc. She is happy with this plan

## 2015-10-14 NOTE — Telephone Encounter (Signed)
clarification done.

## 2015-10-15 ENCOUNTER — Encounter: Payer: Self-pay | Admitting: Internal Medicine

## 2015-10-15 MED ORDER — AMBULATORY NON FORMULARY MEDICATION: Status: DC

## 2015-10-18 ENCOUNTER — Ambulatory Visit: Payer: Self-pay | Admitting: Internal Medicine

## 2015-11-05 ENCOUNTER — Encounter: Payer: Medicare Other | Attending: Physical Medicine & Rehabilitation | Admitting: Physical Medicine & Rehabilitation

## 2015-11-05 ENCOUNTER — Encounter: Payer: Self-pay | Admitting: Physical Medicine & Rehabilitation

## 2015-11-05 VITALS — BP 143/71 | HR 74 | Resp 14

## 2015-11-05 DIAGNOSIS — R002 Palpitations: Secondary | ICD-10-CM | POA: Insufficient documentation

## 2015-11-05 DIAGNOSIS — E0590 Thyrotoxicosis, unspecified without thyrotoxic crisis or storm: Secondary | ICD-10-CM | POA: Insufficient documentation

## 2015-11-05 DIAGNOSIS — Z931 Gastrostomy status: Secondary | ICD-10-CM | POA: Insufficient documentation

## 2015-11-05 DIAGNOSIS — K219 Gastro-esophageal reflux disease without esophagitis: Secondary | ICD-10-CM | POA: Insufficient documentation

## 2015-11-05 DIAGNOSIS — G243 Spasmodic torticollis: Secondary | ICD-10-CM

## 2015-11-05 DIAGNOSIS — F418 Other specified anxiety disorders: Secondary | ICD-10-CM | POA: Diagnosis not present

## 2015-11-05 DIAGNOSIS — K209 Esophagitis, unspecified: Secondary | ICD-10-CM | POA: Insufficient documentation

## 2015-11-05 DIAGNOSIS — G8250 Quadriplegia, unspecified: Secondary | ICD-10-CM

## 2015-11-05 DIAGNOSIS — R159 Full incontinence of feces: Secondary | ICD-10-CM | POA: Diagnosis not present

## 2015-11-05 DIAGNOSIS — G809 Cerebral palsy, unspecified: Secondary | ICD-10-CM

## 2015-11-05 DIAGNOSIS — E785 Hyperlipidemia, unspecified: Secondary | ICD-10-CM | POA: Diagnosis not present

## 2015-11-05 NOTE — Patient Instructions (Addendum)
PLEASE CALL ME WITH ANY PROBLEMS OR QUESTIONS [id number]).     IF YOU WOULD LIKE TO PURSUE THERAPY, LET ME KNOW.

## 2015-11-05 NOTE — Progress Notes (Signed)
Subjective:    Patient ID: [name], male    DOB: [birth year], 30 y.o.   MRN: [id number]  HPI   [name] is here in follow up of his spastic tetraplegia. We did botox on his hamstrings and hip adductors at last visit with good results. He is getting stretching at home especially in the evening. He seems to be tolerating the dantrium well, but he doesn't always get the afternoon dose as it doesn't match up with any of his other meds and he's not eating by mouth currently.     Pain Inventory Average Pain 2 Pain Right Now 0 My pain is sharp, stabbing and tingling  In the last 24 hours, has pain interfered with the following? General activity 0 Relation with others 0 Enjoyment of life 0 What TIME of day is your pain at its worst? evening, night Sleep (in general) NA  Pain is worse with: NA Pain improves with: NA Relief from Meds: 0  Mobility ability to climb steps?  no do you drive?  no use a wheelchair needs help with transfers  Function disabled: date disabled birth I need assistance with the following:  feeding, dressing, bathing, toileting, meal prep, household duties and shopping  Neuro/Psych bladder control problems weakness  Prior Studies Any changes since last visit?  no  Physicians involved in your care Any changes since last visit?  no   Family History  Problem Relation Age of Onset  . Asthma Mother   . Hyperlipidemia Mother   . COPD Mother   . Other Mother     bronchial stasis/ABPA  . Cancer Maternal Grandmother 77    breast  . Hyperlipidemia Maternal Grandmother   . Hypertension Maternal Grandmother   . Cancer Maternal Grandfather     prostate  . Heart disease Paternal Grandfather     CHF  . Osteoporosis Paternal Grandmother   . Arthritis Paternal Grandmother     rheumatoid   Social History   Social History  . Marital Status: Single    Spouse Name: N/A  . Number of Children: 0  . Years of Education: N/A   Occupational History  .  disbaled    Social History Main Topics  . Smoking status: Never Smoker   . Smokeless tobacco: Never Used  . Alcohol Use: No  . Drug Use: No  . Sexual Activity: No   Other Topics Concern  . None   Social History Narrative   Past Surgical History  Procedure Laterality Date  . Spine surgery  ,11/20/2010, 2011    for correction of severe contracturing spinal kyphosis.   . Eye surgery    . Ears tubes    . Hamstring released      to treat contractures.   . Baclofen trial    . Baslofen pump implant    . Spinal fusion    . G-tube insert  August 2006  . Spinal fusioncorrect 106 degree kyphosis    . Spinal fusion to correct 70 degree kyphosis  11-2010  . Tonsillectomy    . Peg placement  10/21/2011    Procedure: PERCUTANEOUS ENDOSCOPIC GASTROSTOMY (PEG) REPLACEMENT;  Surgeon: [name], MD;  Location: [facility] ENDOSCOPY;  Service: Endoscopy;  Laterality: N/A;  . Peg placement N/A 06/13/2013    Procedure: PERCUTANEOUS ENDOSCOPIC GASTROSTOMY (PEG) REPLACEMENT;  Surgeon: [name], MD;  Location: [facility] ENDOSCOPY;  Service: Endoscopy;  Laterality: N/A;  . Hip surgery      x2 , side   .  Flexible sigmoidoscopy N/A 09/07/2014    Procedure: FLEXIBLE SIGMOIDOSCOPY;  Surgeon: [name], MD;  Location[facility];  Service: Endoscopy;  Laterality: N/A;   Past Medical History  Diagnosis Date  . Cerebral palsy ([place])   . GERD (gastroesophageal reflux disease)   . Hyperthyroidism   . Incontinence of feces   . Palpitations   . Esophagitis 2011  . Dehydration 11/22/2013  . Loss of weight 08/28/2014  . Depression with anxiety 08/01/2010    Qualifier: Diagnosis of  By: Nelson-Smith CMA (AAMA), Dottie    . Gastrostomy in place [facility]) 08/31/2013  . Medicare annual wellness visit, subsequent 08/25/2015  . Hyperlipidemia, mild 08/25/2015   BP 143/71 mmHg  Pulse 74  Resp 14  Opioid Risk Score:   Fall Risk Score:  `1  Depression screen PHQ 2/9  No flowsheet data found.   Review of Systems   Constitutional:       Bladder control problems  Neurological: Positive for weakness.  All other systems reviewed and are negative.      Objective:   Physical Exam  General: Alert and oriented x 3, No apparent distress. He is sitting in a motorized chair with head rest and trunk supports. He appears to be in good spirits.  HEENT: Head is normocephalic, atraumatic, PERRLA, EOMI, sclera anicteric, oral mucosa pink and dry--occasional plaques white, dentition intact, ext ear canals clear,  Neck: Supple without JVD or lymphadenopathy  Heart: Reg rate and rhythm. No murmurs rubs or gallops  Chest: CTA bilaterally without wheezes, rales, or rhonchi; no distress  Abdomen: Soft, non-tender, non-distended, bowel sounds positive.  Extremities: No clubbing, cyanosis, or edema. Pulses are 2+  Skin: Clean and intact  Neuro:He worsened oralmotor control. He appears much more alert however. His eye contact his better. Posture is better. He holds his head more in a neutral position although it remains rotated and bent to the left.. Tone in the UE is 3-4/4 pec major/minor, 3/4 bicep and wrist/HI-- 1/4 hamstrings, quads 2/4, HAD's at 0/4.   Heel cords were tight as well at 3/4. Pt with minimal volitional movement on exam. He can sense pain in the trunk and extremities  Musculoskeletal: low back, trunk and cervical ROM is limited. He has kyphosis. . Both traps and SCM's are improved from baseline. Head posture much improved. His hip adductors are tight 2-3/4.  Psych: Pt's affect is appropriate. Pt is cooperative given his cognitive linguistic issues. Does understand basic commands and answers questions although his speech is difficult to understand.   Assessment & Plan:  1. CP with spastic tetraplegia  2. 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 Severe oro-pharyngeal dysphagia with G-tube. Likely related to  general course/progression of his cerebral palsy.  Plan:  1. Has benefited from botox. Can look at follow up injections in the spring.   2.Continue dantrium 50mg  am, 50mg  dinner and 50mg  qhs.  3. Consider brief course of PT to address ROM.  4. Follow up in about 3 months. 25 minutes of face to face patient care time were spent during this visit. All questions were encouraged and answered.

## 2015-11-21 ENCOUNTER — Ambulatory Visit (HOSPITAL_BASED_OUTPATIENT_CLINIC_OR_DEPARTMENT_OTHER)
Admission: RE | Admit: 2015-11-21 | Discharge: 2015-11-21 | Disposition: A | Discharge status: Home / Self Care | Payer: Medicare Other | Source: Ambulatory Visit | Attending: Pulmonary Disease | Admitting: Pulmonary Disease

## 2015-11-21 ENCOUNTER — Other Ambulatory Visit: Payer: Medicare Other

## 2015-11-21 ENCOUNTER — Other Ambulatory Visit (INDEPENDENT_AMBULATORY_CARE_PROVIDER_SITE_OTHER): Payer: Medicare Other

## 2015-11-21 ENCOUNTER — Encounter: Payer: Self-pay | Admitting: Internal Medicine

## 2015-11-21 ENCOUNTER — Encounter: Payer: Self-pay | Admitting: Pulmonary Disease

## 2015-11-21 ENCOUNTER — Telehealth: Payer: Self-pay | Admitting: Family Medicine

## 2015-11-21 ENCOUNTER — Ambulatory Visit (INDEPENDENT_AMBULATORY_CARE_PROVIDER_SITE_OTHER): Payer: Medicare Other | Admitting: Pulmonary Disease

## 2015-11-21 VITALS — BP 109/73 | HR 81 | Ht 62.0 in | Wt 93.0 lb

## 2015-11-21 DIAGNOSIS — R0989 Other specified symptoms and signs involving the circulatory and respiratory systems: Secondary | ICD-10-CM | POA: Insufficient documentation

## 2015-11-21 DIAGNOSIS — J984 Other disorders of lung: Secondary | ICD-10-CM

## 2015-11-21 DIAGNOSIS — M419 Scoliosis, unspecified: Secondary | ICD-10-CM

## 2015-11-21 DIAGNOSIS — J439 Emphysema, unspecified: Secondary | ICD-10-CM | POA: Diagnosis not present

## 2015-11-21 DIAGNOSIS — E860 Dehydration: Secondary | ICD-10-CM

## 2015-11-21 DIAGNOSIS — E43 Unspecified severe protein-calorie malnutrition: Secondary | ICD-10-CM

## 2015-11-21 DIAGNOSIS — R05 Cough: Secondary | ICD-10-CM | POA: Insufficient documentation

## 2015-11-21 LAB — CBC WITH DIFFERENTIAL/PLATELET
Basophils Absolute: 0 10*3/uL (ref 0.0–0.1)
Basophils Relative: 0 % (ref 0–1)
Eosinophils Absolute: 0.1 10*3/uL (ref 0.0–0.7)
Eosinophils Relative: 1 % (ref 0–5)
HCT: 43.6 % (ref 39.0–52.0)
Hemoglobin: 14.4 g/dL (ref 13.0–17.0)
Lymphocytes Relative: 36 % (ref 12–46)
Lymphs Abs: 2.4 10*3/uL (ref 0.7–4.0)
MCH: 30.3 pg (ref 26.0–34.0)
MCHC: 33 g/dL (ref 30.0–36.0)
MCV: 91.8 fL (ref 78.0–100.0)
MPV: 11 fL (ref 8.6–12.4)
Monocytes Absolute: 0.4 10*3/uL (ref 0.1–1.0)
Monocytes Relative: 6 % (ref 3–12)
Neutro Abs: 3.9 10*3/uL (ref 1.7–7.7)
Neutrophils Relative %: 57 % (ref 43–77)
Platelets: 207 10*3/uL (ref 150–400)
RBC: 4.75 MIL/uL (ref 4.22–5.81)
RDW: 13.4 % (ref 11.5–15.5)
WBC: 6.8 10*3/uL (ref 4.0–10.5)

## 2015-11-21 LAB — BASIC METABOLIC PANEL
BUN: 9 mg/dL (ref 7–25)
CO2: 25 mmol/L (ref 20–31)
Calcium: 9.5 mg/dL (ref 8.6–10.3)
Chloride: 100 mmol/L (ref 98–110)
Creat: 0.36 mg/dL — ABNORMAL LOW (ref 0.60–1.35)
Glucose, Bld: 73 mg/dL (ref 65–99)
Potassium: 3.6 mmol/L (ref 3.5–5.3)
Sodium: 137 mmol/L (ref 135–146)

## 2015-11-21 NOTE — Telephone Encounter (Signed)
Pt's mother dropped off letter wanting Dr. Charlett Blake to prepare a letter and to please mail or call pt's mother Malacki Venier) to pick up.

## 2015-11-21 NOTE — Patient Instructions (Signed)
Blood work &  CXR today We will provide letter as requested Call as needed

## 2015-11-21 NOTE — Progress Notes (Signed)
Subjective:    Patient ID: [name], male    DOB: 08/07/1986, 30 y.o.   MRN: [id number]  HPI 30 year old male.- accompanied by mother for follow-up of recurrent pneumonias due to poor secretion clearance. He has cerebral palsy and needs assistance for all activities of daily living, baseline wheelchair-bound. Underwent T9-C6 spinal fusion at [facility] in Sept 2011.  PEG placed in 2012 for hydration.  He was hospitalized in 08/2013 for aspiration pneumonia,Swallow evaluation showed severe dysphagia with high risk of aspirating. He has 4 caregivers at home.  Started back on tube feeds exclusively due to aspiration 07/2014  01/30/2015 - PEJ placed     11/21/2015  Chief Complaint  Patient presents with  . Follow-up    Pt c/o chest pain at times, cough with thick white mucus, and wheezing. Denies any sinus draiange/pressure, fever nausea or vomiting.    73-month follow-up, accompanied by mother  Had GJ tube last year - this fell out in 06/2015 & [name] replaced with G tube Last few days , had spots around G tube , she has contacted GI- changed again yesterday by mom, concern for dehydration since Tfs held   Mom also needs a letter for [employer] has a new wheelchair and laptop -he is excited to use this and was able to demonstrate to me  He continues to have problems with secretion clearance, is compliant with his vest, complains of a wet cough, last hospitalization in 09/2014   01/2015 CT chest with contrast -Volume loss in the RIGHT lower lobe with rounding of vessels suggesting rounded atelectasis, Pleural fluid collection at posterior inferior RIGHT hemi thorax  Chest x-ray today- no effusion or infiltrate   Review of Systems neg for any significant sore throat, dysphagia, itching, sneezing, nasal congestion or excess/ purulent secretions, fever, chills, sweats, unintended wt loss, pleuritic or exertional cp, hempoptysis, orthopnea pnd or change in chronic leg  swelling. Also denies presyncope, palpitations, heartburn, abdominal pain, nausea, vomiting, diarrhea or change in bowel or urinary habits, dysuria,hematuria, rash, arthralgias, visual complaints, headache, numbness weakness or ataxia.      Objective:   Physical Exam  Gen. Pleasant, thin, in no distress, in wheelchair ENT - no lesions, no post nasal drip Neck: No JVD, no thyromegaly, no carotid bruits Lungs: Scoliosis no use of accessory muscles, no dullness to percussion, decreased left base without rales or rhonchi  Cardiovascular: Rhythm regular, heart sounds  normal, no murmurs or gallops, no peripheral edema Musculoskeletal: Scoliosis, no cyanosis or clubbing         Assessment & Plan:   To whom it May concern  Re:  I am writing this letter to support [name]'s application to maintain the level of care that he currently has through [facility]  Mamon has cerebral palsy and needs assistance for all activities of daily living. I have been involved in his care since 2014. He is wheelchair bound. He enjoys moving around in his wheelchair and operating his computer. He is able to interact well and enjoys watching his favorite Cubs play. He has dysphagia and requires tube feeding. Unfortunately his calorie intake has suffered due to recent illnesses and he does have severe protein calorie malnutrition. He is incontinent and requires a Foley catheter. Due to his pain and spasticity he requires frequent repositioning in bed to provide pressure relief and prevent bedsores. This does mean that he needs frequent care throughout the night.  Please help [name] in whatever way you can  to maintain his current quality of life.   [name] MD

## 2015-11-22 NOTE — Telephone Encounter (Signed)
Forwarded to Dr. Blyth. JG//CMA  

## 2015-11-22 NOTE — Assessment & Plan Note (Signed)
No evidence of dehydration on labs today She will continue to hydrate

## 2015-11-22 NOTE — Assessment & Plan Note (Signed)
He continues to have problems with secretion clearance. I do feel that this would be a chronic issue. They have not required bronchodilators-he continues to use vest with good results

## 2015-11-22 NOTE — Assessment & Plan Note (Signed)
His nutrition remains an issue

## 2015-12-05 ENCOUNTER — Encounter: Payer: Self-pay | Admitting: Internal Medicine

## 2015-12-06 ENCOUNTER — Other Ambulatory Visit: Payer: Self-pay

## 2015-12-06 MED ORDER — FLUCONAZOLE 100 MG PO TABS: ORAL_TABLET | ORAL | Status: DC

## 2015-12-06 NOTE — Telephone Encounter (Signed)
Not sure it will help, but very little down-side Let's treat for yeast infection and see if this helps. Fluconazole 200 mg x 1 day, 100 mg x 9 days Have her let me know if it helps, otherwise may need to pursue G-J with IR

## 2015-12-09 ENCOUNTER — Telehealth: Payer: Self-pay | Admitting: *Deleted

## 2015-12-09 DIAGNOSIS — G800 Spastic quadriplegic cerebral palsy: Secondary | ICD-10-CM | POA: Diagnosis not present

## 2015-12-10 NOTE — Telephone Encounter (Signed)
Ms. Lamia has requested that Dr Hilarie Fredrickson write a letter stating Colum's need for [facility]  Budget support (it is to be cut $10,000 this year). See her letter under "media" tab in EPIC. Letter has been created and signed by Dr Hilarie Fredrickson. I have mailed the letter to the patient's home address.

## 2015-12-13 DIAGNOSIS — G800 Spastic quadriplegic cerebral palsy: Secondary | ICD-10-CM | POA: Diagnosis not present

## 2015-12-13 IMAGING — XA IR REPLACE G/J TUBE W/ FLUORO
2 series · 7 of 7 positions shown · non-contrast
Comparison: none

CLINICAL DATA: Occluded gastrojejunostomy, chronic tube feeds

[Series 2: fl - angio · 4 of 30 frames shown]
[frame 5/30]
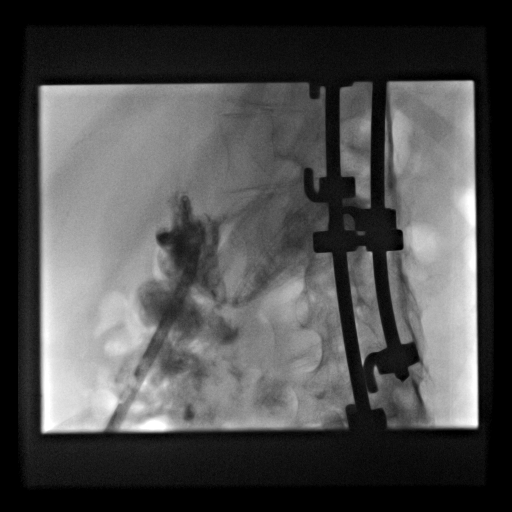
[frame 7/30]
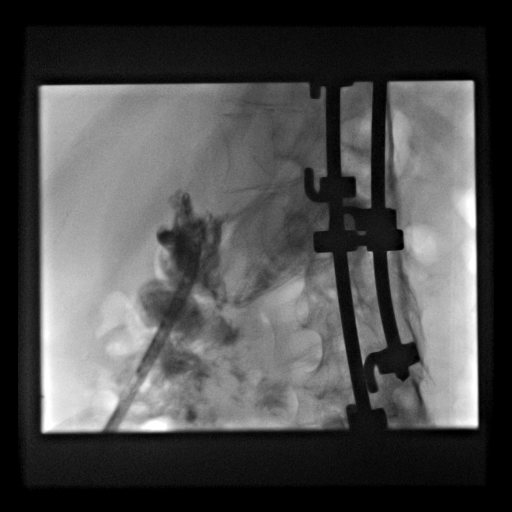
[frame 16/30]
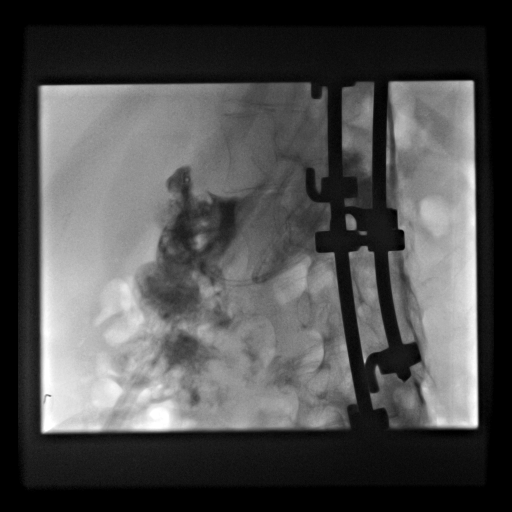
[frame 26/30]
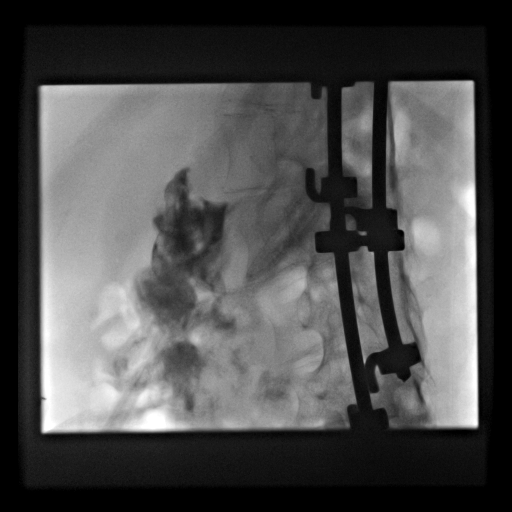

[Series 300: ir radiologist eval & mgmt · 3 of 3 slices shown]
[im 1/3]
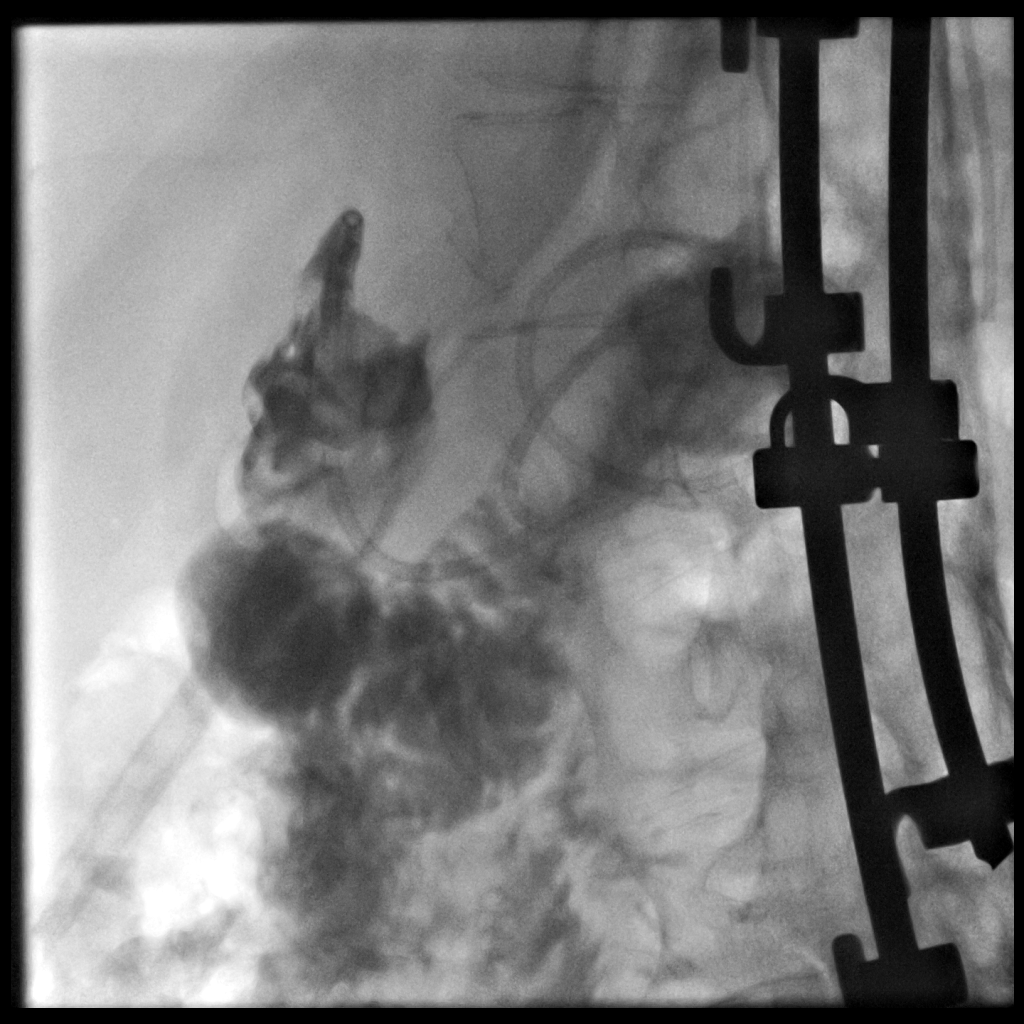
[im 2/3]
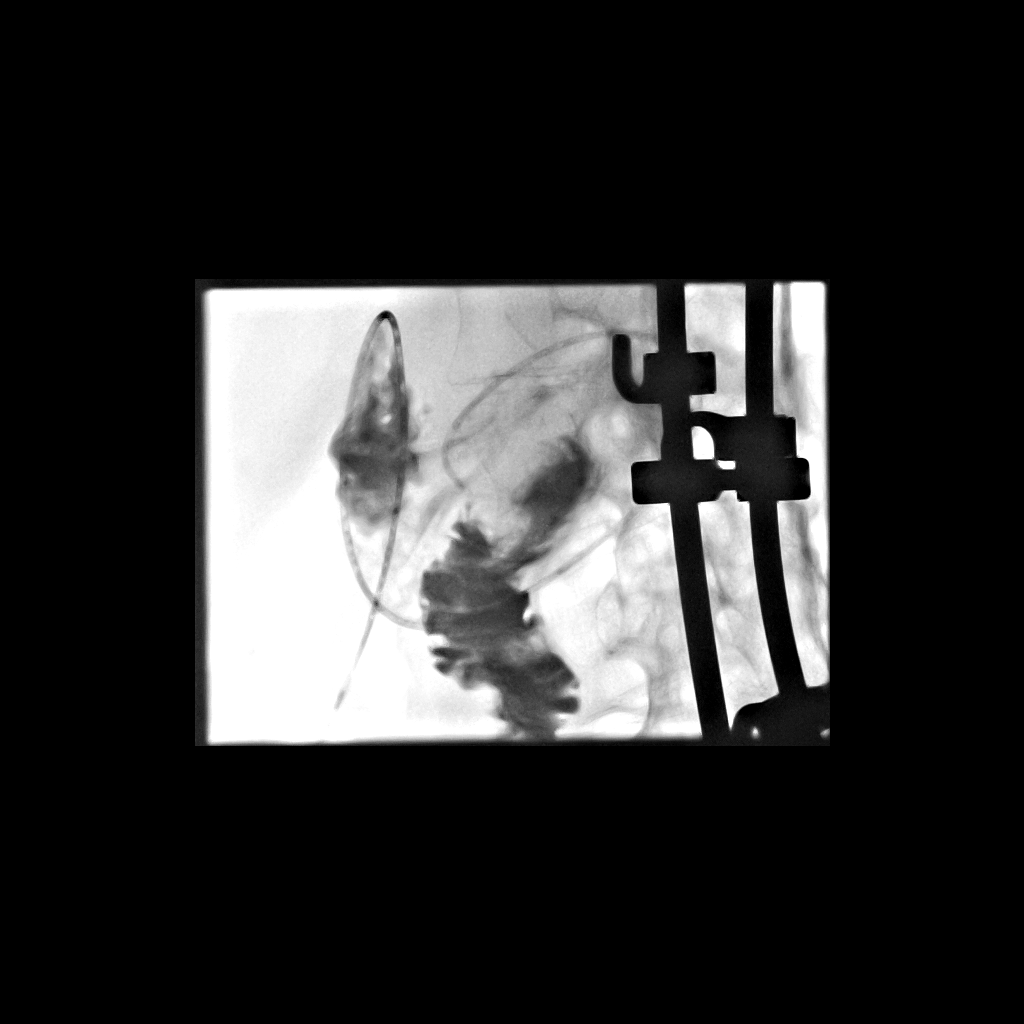
[im 3/3]
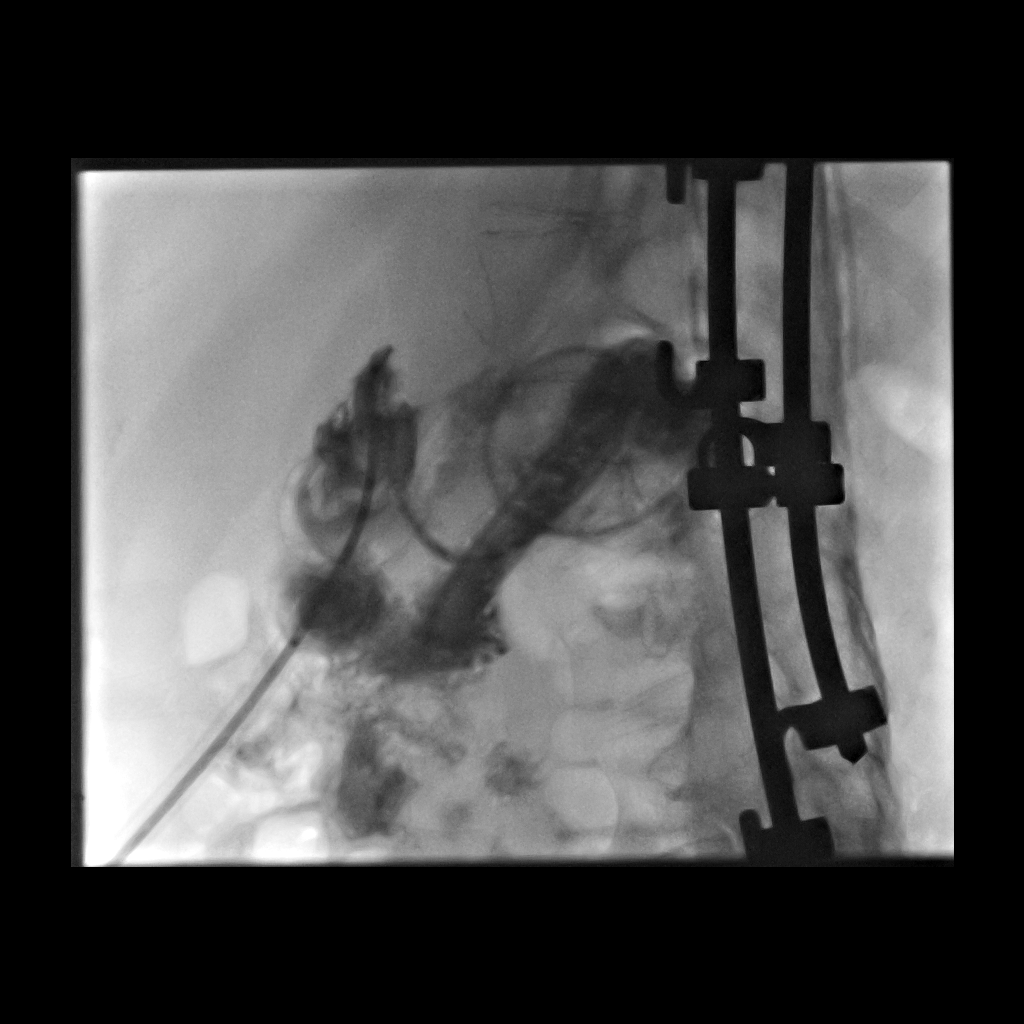

[7 of 7 positions shown; findings below may reference images not displayed]

EXAM:
FLUOROSCOPIC EXCHANGE OF THE EXISTING GASTROJEJUNOSTOMY

Date:  [DATE] [DATE]

Radiologist:  Dabasi, Jakab Andi

Guidance:  Fluoroscopic

FLUOROSCOPY TIME:  4 minutes 54 seconds, 58 mGy

MEDICATIONS AND MEDICAL HISTORY:
None.

ANESTHESIA/SEDATION:
None.

CONTRAST:  20 cc Omnipaque 300

COMPLICATIONS:
None immediate

PROCEDURE:
Informed consent was obtained from the patient following explanation
of the procedure, risks, benefits and alternatives. The patient
understands, agrees and consents for the procedure. All questions
were addressed. A time out was performed.

Maximal barrier sterile technique utilized including caps, mask,
sterile gowns, sterile gloves, large sterile drape, hand hygiene,
and ChloraPrep.

Under sterile conditions, the existing occluded gastrojejunostomy
was cut and removed over a stiff Glidewire. A new 24 French
gastrojejunostomy was advanced over the Glidewire into the proximal
jejunum. Position confirmed with fluoroscopy. Both lumens are
patent. Retention balloon inflated with 7 cc saline. This was
retracted against the anterior gastric wall. Access secured
externally.
IMPRESSION: Successful fluoroscopic exchange of the 24 French gastrojejunostomy.

## 2015-12-16 ENCOUNTER — Encounter: Payer: Self-pay | Admitting: Family Medicine

## 2015-12-24 DIAGNOSIS — F339 Major depressive disorder, recurrent, unspecified: Secondary | ICD-10-CM | POA: Diagnosis not present

## 2015-12-26 ENCOUNTER — Encounter: Payer: Self-pay | Admitting: Medical

## 2015-12-26 ENCOUNTER — Ambulatory Visit (INDEPENDENT_AMBULATORY_CARE_PROVIDER_SITE_OTHER): Payer: Medicare Other | Admitting: Medical

## 2015-12-26 VITALS — BP 103/70 | HR 89 | Temp 98.1°F | Wt 93.0 lb

## 2015-12-26 DIAGNOSIS — J0100 Acute maxillary sinusitis, unspecified: Secondary | ICD-10-CM

## 2015-12-26 DIAGNOSIS — J209 Acute bronchitis, unspecified: Secondary | ICD-10-CM | POA: Diagnosis not present

## 2015-12-26 MED ORDER — FLUTICASONE PROPIONATE 50 MCG/ACT NA SUSP: 2.0000 | Freq: Every day | NASAL | Status: DC

## 2015-12-26 MED ORDER — AZITHROMYCIN 200 MG/5ML PO SUSR: ORAL | Status: DC

## 2015-12-26 NOTE — Patient Instructions (Signed)
For probable sinusitis and bronchitis will rx azithromycin.  For nasal congestion. Did make flonase available.  Will watch closely and if any worsening then get chest xray and cbc.  Follow up in 7 days or as needed

## 2015-12-26 NOTE — Progress Notes (Signed)
Subjective:    Patient ID: [name], male    DOB: 1986-08-23, 30 y.o.   MRN: [id number]  HPI  2 days of nasal and chest congestion. No fever or chills. He is coughing up some mucous. No mucous drainage from nose. In 2016 did not have many respiratory type illness. Pt aid who works with him was sick with bronchitis. Pt had some exposure before he was ill.   No body aches.  Pt has spastic tetraplegia/CP.  Pt relative state in past cough meds will overstimulate him.   Review of Systems  Constitutional: Negative for fever, chills and fatigue.  HENT: Positive for congestion, rhinorrhea and sinus pressure. Negative for ear pain, postnasal drip, sneezing and sore throat.   Respiratory: Positive for cough. Negative for shortness of breath and wheezing.   Cardiovascular: Negative for chest pain and palpitations.  Gastrointestinal: Negative for abdominal pain.  Musculoskeletal: Negative for myalgias and back pain.  Skin: Negative for rash.  Hematological: Negative for adenopathy. Does not bruise/bleed easily.  Psychiatric/Behavioral: Negative for behavioral problems and confusion.     Past Medical History  Diagnosis Date  . Cerebral palsy ([place])   . GERD (gastroesophageal reflux disease)   . Hyperthyroidism   . Incontinence of feces   . Palpitations   . Esophagitis 2011  . Dehydration 11/22/2013  . Loss of weight 08/28/2014  . Depression with anxiety 08/01/2010    Qualifier: Diagnosis of  By: Nelson-Smith CMA (AAMA), Dottie    . 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 Medicare annual wellness visit, subsequent 08/25/2015  . Hyperlipidemia, mild 08/25/2015    Social History   Social History  . Marital Status: Single    Spouse Name: N/A  . Number of Children: 0  . Years of Education: N/A   Occupational History  . disbaled    Social History Main Topics  . Smoking status: Never Smoker   . Smokeless tobacco: Never Used  . Alcohol Use: No  . Drug Use: No  . Sexual  Activity: No   Other Topics Concern  . Not on file   Social History Narrative    Past Surgical History  Procedure Laterality Date  . Spine surgery  ,11/20/2010, 2011    for correction of severe contracturing spinal kyphosis.   . Eye surgery    . Ears tubes    . Hamstring released      to treat contractures.   . Baclofen trial    . Baslofen pump implant    . Spinal fusion    . G-tube insert  August 2006  . Spinal fusioncorrect 106 degree kyphosis    . Spinal fusion to correct 70 degree kyphosis  11-2010  . Tonsillectomy    . Peg placement  10/21/2011    Procedure: PERCUTANEOUS ENDOSCOPIC GASTROSTOMY (PEG) REPLACEMENT;  Surgeon: [name], MD;  Location: [facility] ENDOSCOPY;  Service: Endoscopy;  Laterality: N/A;  . Peg placement N/A 06/13/2013    Procedure: PERCUTANEOUS ENDOSCOPIC GASTROSTOMY (PEG) REPLACEMENT;  Surgeon: [name], MD;  Location: [facility] ENDOSCOPY;  Service: Endoscopy;  Laterality: N/A;  . Hip surgery      x2 , side   . Flexible sigmoidoscopy N/A 09/07/2014    Procedure: FLEXIBLE SIGMOIDOSCOPY;  Surgeon: [name], MD;  Location: [facility] ENDOSCOPY;  Service: Endoscopy;  Laterality: N/A;    Family History  Problem Relation Age of Onset  . Asthma Mother   . Hyperlipidemia Mother   . COPD Mother   .  Other Mother     bronchial stasis/ABPA  . Cancer Maternal Grandmother 38    breast  . Hyperlipidemia Maternal Grandmother   . Hypertension Maternal Grandmother   . Cancer Maternal Grandfather     prostate  . Heart disease Paternal Grandfather     CHF  . Osteoporosis Paternal Grandmother   . Arthritis Paternal Grandmother     rheumatoid    Allergies  Allergen Reactions  . Ambien [Zolpidem Tartrate] Nausea Only  . Codeine Other (See Comments)    Makes patient too active after a few days.  . Baclofen Anxiety  . Sulfonamide Derivatives Rash    Current Outpatient Prescriptions on File Prior to Visit  Medication Sig Dispense Refill  . AMBULATORY NON FORMULARY  MEDICATION Medication Name: MIC gastrostomy/bolus feeding tube 24 French Part number 0110-24. #2 and 10 cc lurer lock syringe #2 Dx: 4 Device 2  . bacitracin 500 UNIT/GM ointment Apply 1 application topically 2 (two) times daily. 30 g 1  . clotrimazole-betamethasone (LOTRISONE) cream Apply 1 application topically 2 (two) times daily. 45 g 1  . dantrolene (DANTRIUM) 50 MG capsule TAKE 1 CAPSULE BY MOUTH 3 TIMES A DAY. 90 capsule 4  . divalproex (DEPAKOTE SPRINKLE) 125 MG capsule Take 2 capsules in morning, 5 capsules at bedtime (Patient taking differently: 250-625 mg. Take 2 capsules in morning, 5 capsules at bedtime) 210 capsule 4  . Feeding Tubes - Bags (FLEXIFLO FEEDING BAG/PUMP) MISC 1 Units by Does not apply route continuous. 1 each 30  . Feeding Tubes - Pump MISC 1 Units by Does not apply route continuous. 1 each 0  . Feeding Tubes - Sets (KANGAROO EPUMP SET 1000ML) MISC 30 day supply of Kangaroo Joey PUmp set with flush bag 1 each 5  . Incontinence Supplies (BARD LEG BAG STRAPS/FABRIC) MISC Bard Dispoz-a-Bag leg bag w/flip flo Valve, sterile, w/Gabric strap, 18" extension tubing 19 oz  Item ID:[id number] 2 each 6  . Incontinence Supply Disposable (PREVAIL BREEZERS MEDIUM) MISC pkg of 16- size medium 32" to 44"  Breathable cloth-like outer fabric (can't use the plastic outer surgace  Item # [id number] 16 each 6  . LORazepam (ATIVAN) 1 MG tablet Take one tablet in evenings as needed for insomnia. 30 tablet 1  . metoprolol tartrate (LOPRESSOR) 25 MG tablet Take 25 mg by mouth 2 (two) times daily as needed. Reported on 11/21/2015    . NON FORMULARY Bard Leg Bag Extension tubing w/Connector 18", Sterile, latex-free  Item# [id number]    . NON FORMULARY Colorplast Freedom Cath Latex Self-Adhering Male External Catheter 31mm Diameter Intermediate  Item# [id number]    . NONFORMULARY OR COMPOUNDED ITEM Covidien REF [id number] - Kangaroo Joey Pump Set with Flush Bags - 1000 mL 1 each 0  . Nutritional  Supplements (FEEDING SUPPLEMENT, JEVITY 1.5 CAL,) LIQD Run tube feed at 70 mL per hour over 17 hours. 35550 mL 11  . nystatin cream (MYCOSTATIN) Apply 1 application topically 2 (two) times daily as needed for dry skin. 30 g 1  . OLANZapine (ZYPREXA) 2.5 MG tablet Take 1 tablet (2.5 mg total) by mouth 2 (two) times daily as needed.    Marland Kitchen OLANZapine (ZYPREXA) 5 MG tablet Take 5 mg by mouth at bedtime.    Earney Navy Bicarbonate (ZEGERID) 20-1100 MG CAPS capsule Take 1 capsule by mouth daily before breakfast.    . Ostomy Supplies (PROTECTIVE BARRIER WIPES) MISC 1-1/4" X 3"  Item ID:[id number] 75 each 6  . oxybutynin (DITROPAN) 5 MG/5ML syrup  Take 5 mg by mouth 2 (two) times daily.     Marland Kitchen PARoxetine (PAXIL) 10 MG tablet Take 15 mg by mouth daily.    Marland Kitchen PRESCRIPTION MEDICATION Colorplast Freedom Cath Latex Self-Adhering Male External Catheter 53mm Diameter Intermediate  Item# [id number]    . fluconazole (DIFLUCAN) 100 MG tablet Take 2 tabs day 1 then one tablet daily until gone (Patient not taking: Reported on 12/26/2015) 11 tablet 0   No current facility-administered medications on file prior to visit.    BP 103/70 mmHg  Pulse 89  Temp(Src) 98.1 F (36.7 C)  Wt 93 lb (42.185 kg)  SpO2 99%       Objective:   Physical Exam  General  Mental Status - Alert. General Appearance - Well groomed. Not in acute distress.(Pt has cp. Here with relative)   Skin Rashes- No Rashes.  HEENT Head- Normal. Ear Auditory Canal - Left- Normal. Right - Normal.Tympanic Membrane- Left- Normal. Right- Normal. Eye Sclera/Conjunctiva- Left- Normal. Right- Normal. Nose & Sinuses Nasal Mucosa- Left-  Boggy and Congested. Right-  Boggy and  Congested. Bilateral maxillary and frontal sinus pressure. Mouth & Throat Lips: Upper Lip- Normal: no dryness, cracking, pallor, cyanosis, or vesicular eruption. Lower Lip-Normal: no dryness, cracking, pallor, cyanosis or vesicular eruption. Buccal Mucosa- Bilateral- No  Aphthous ulcers. Mucous present posterior pharyx/palate area Oropharynx- No Discharge or Erythema. Tonsils: Characteristics- Bilateral- mild Erythema and mild Congestion. Size/Enlargement- Bilateral- No enlargement. Discharge- bilateral-None.  Neck Neck- Supple. No Masses.   Chest and Lung Exam Auscultation: Breath Sounds:-Clear even and unlabored.  Cardiovascular Auscultation:Rythm- Regular, rate and rhythm. Murmurs & Other Heart Sounds:Ausculatation of the heart reveal- No Murmurs.  Lymphatic Head & Neck General Head & Neck Lymphatics: Bilateral: Description- No Localized lymphadenopathy.       Assessment & Plan:  For probable sinusitis and bronchitis will rx azithromycin.  For nasal congestion. Did make flonase available.  Will watch closely and if any worsening then get chest xray and cbc.  Follow up in 7 days or as needed

## 2015-12-31 ENCOUNTER — Encounter: Payer: Self-pay | Admitting: Medical

## 2015-12-31 ENCOUNTER — Ambulatory Visit: Payer: Self-pay | Admitting: Physical Medicine & Rehabilitation

## 2016-01-02 ENCOUNTER — Encounter: Payer: Self-pay | Admitting: Medical

## 2016-01-02 ENCOUNTER — Telehealth: Payer: Self-pay | Admitting: Family Medicine

## 2016-01-02 ENCOUNTER — Ambulatory Visit (HOSPITAL_BASED_OUTPATIENT_CLINIC_OR_DEPARTMENT_OTHER)
Admission: RE | Admit: 2016-01-02 | Discharge: 2016-01-02 | Disposition: A | Discharge status: Home / Self Care | Payer: Medicare Other | Source: Ambulatory Visit | Attending: Medical | Admitting: Medical

## 2016-01-02 ENCOUNTER — Ambulatory Visit (INDEPENDENT_AMBULATORY_CARE_PROVIDER_SITE_OTHER): Payer: Medicare Other | Admitting: Medical

## 2016-01-02 VITALS — BP 106/72 | HR 86 | Temp 98.1°F

## 2016-01-02 DIAGNOSIS — R059 Cough, unspecified: Secondary | ICD-10-CM

## 2016-01-02 DIAGNOSIS — R05 Cough: Secondary | ICD-10-CM | POA: Insufficient documentation

## 2016-01-02 DIAGNOSIS — R918 Other nonspecific abnormal finding of lung field: Secondary | ICD-10-CM | POA: Insufficient documentation

## 2016-01-02 MED ORDER — HYDROCODONE-HOMATROPINE 5-1.5 MG/5ML PO SYRP: 5.0000 mL | ORAL_SOLUTION | Freq: Three times a day (TID) | ORAL | Status: DC | PRN

## 2016-01-02 NOTE — Progress Notes (Signed)
Pre visit review using our clinic review tool, if applicable. No additional management support is needed unless otherwise documented below in the visit note. 

## 2016-01-02 NOTE — Telephone Encounter (Signed)
Caller name:Linda  Relation to pt:mom Call back number:[phone] Pharmacy:  Reason for call: pt's mom picked up a letter that dr. Charlett Blake had written for the pt explaining his visit and care medical care with dr. Charlett Blake because medicaid is trying to stop his services, however mom states there was additional paperwork that she was suppose to get with the letter. She states you had called her and informed her that the letter and paperwork was ready. We were able to print the letter again however there was no additional paperwork. She would like for you to call her.

## 2016-01-02 NOTE — Progress Notes (Signed)
Subjective:    Patient ID: [name], male    DOB: 1985/11/07, 30 y.o.   MRN: [id number]  HPI  Pt was coughing the other day at day program per Nurse who takes care of hism.  Pt did take 5 days of antibiotic z-pack. Pt had some loose stools transiently but none since. No vomiting. With antibiotic not much improvement with cough.  Continues with chest congestion. No fevers. No chills. No body aches. Pt refused to use his nasonex.  They are suctioning and mucous is white. Not colored. He has neb available for him at home.  Review of Systems  Constitutional: Negative for fever, chills and diaphoresis.  HENT: Negative for congestion, ear discharge, ear pain, postnasal drip, rhinorrhea and sinus pressure.   Respiratory: Positive for cough. Negative for chest tightness, shortness of breath and wheezing.   Cardiovascular: Negative for chest pain and palpitations.  Gastrointestinal: Negative for abdominal pain.  Musculoskeletal: Negative for back pain.  Skin: Negative for rash.  Neurological: Negative for dizziness and headaches.  Hematological: Negative for adenopathy. Does not bruise/bleed easily.    Past Medical History  Diagnosis Date  . Cerebral palsy ([place])   . GERD (gastroesophageal reflux disease)   . Hyperthyroidism   . Incontinence of feces   . Palpitations   . Esophagitis 2011  . Dehydration 11/22/2013  . Loss of weight 08/28/2014  . Depression with anxiety 08/01/2010    Qualifier: Diagnosis of  By: Nelson-Smith CMA (AAMA), Dottie    . 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 Medicare annual wellness visit, subsequent 08/25/2015  . Hyperlipidemia, mild 08/25/2015    Social History   Social History  . Marital Status: Single    Spouse Name: N/A  . Number of Children: 0  . Years of Education: N/A   Occupational History  . disbaled    Social History Main Topics  . Smoking status: Never Smoker   . Smokeless tobacco: Never Used  . Alcohol Use: No  . Drug  Use: No  . Sexual Activity: No   Other Topics Concern  . Not on file   Social History Narrative    Past Surgical History  Procedure Laterality Date  . Spine surgery  ,11/20/2010, 2011    for correction of severe contracturing spinal kyphosis.   . Eye surgery    . Ears tubes    . Hamstring released      to treat contractures.   . Baclofen trial    . Baslofen pump implant    . Spinal fusion    . G-tube insert  August 2006  . Spinal fusioncorrect 106 degree kyphosis    . Spinal fusion to correct 70 degree kyphosis  11-2010  . Tonsillectomy    . Peg placement  10/21/2011    Procedure: PERCUTANEOUS ENDOSCOPIC GASTROSTOMY (PEG) REPLACEMENT;  Surgeon: [name], MD;  Location: [facility] ENDOSCOPY;  Service: Endoscopy;  Laterality: N/A;  . Peg placement N/A 06/13/2013    Procedure: PERCUTANEOUS ENDOSCOPIC GASTROSTOMY (PEG) REPLACEMENT;  Surgeon: [name], MD;  Location: [facility] ENDOSCOPY;  Service: Endoscopy;  Laterality: N/A;  . Hip surgery      x2 , side   . Flexible sigmoidoscopy N/A 09/07/2014    Procedure: FLEXIBLE SIGMOIDOSCOPY;  Surgeon: [name], MD;  Location: [facility] ENDOSCOPY;  Service: Endoscopy;  Laterality: N/A;    Family History  Problem Relation Age of Onset  . Asthma Mother   . Hyperlipidemia Mother   . COPD Mother   .  Other Mother     bronchial stasis/ABPA  . Cancer Maternal Grandmother 87    breast  . Hyperlipidemia Maternal Grandmother   . Hypertension Maternal Grandmother   . Cancer Maternal Grandfather     prostate  . Heart disease Paternal Grandfather     CHF  . Osteoporosis Paternal Grandmother   . Arthritis Paternal Grandmother     rheumatoid    Allergies  Allergen Reactions  . Ambien [Zolpidem Tartrate] Nausea Only  . Codeine Other (See Comments)    Makes patient too active after a few days.  . Baclofen Anxiety  . Sulfonamide Derivatives Rash    Current Outpatient Prescriptions on File Prior to Visit  Medication Sig Dispense Refill  .  AMBULATORY NON FORMULARY MEDICATION Medication Name: MIC gastrostomy/bolus feeding tube 24 French Part number 0110-24. #2 and 10 cc lurer lock syringe #2 Dx: 4 Device 2  . bacitracin 500 UNIT/GM ointment Apply 1 application topically 2 (two) times daily. 30 g 1  . clotrimazole-betamethasone (LOTRISONE) cream Apply 1 application topically 2 (two) times daily. 45 g 1  . dantrolene (DANTRIUM) 50 MG capsule TAKE 1 CAPSULE BY MOUTH 3 TIMES A DAY. 90 capsule 4  . divalproex (DEPAKOTE SPRINKLE) 125 MG capsule Take 2 capsules in morning, 5 capsules at bedtime (Patient taking differently: 250-625 mg. Take 2 capsules in morning, 5 capsules at bedtime) 210 capsule 4  . Feeding Tubes - Bags (FLEXIFLO FEEDING BAG/PUMP) MISC 1 Units by Does not apply route continuous. 1 each 30  . Feeding Tubes - Pump MISC 1 Units by Does not apply route continuous. 1 each 0  . Feeding Tubes - Sets (KANGAROO EPUMP SET 1000ML) MISC 30 day supply of Kangaroo Joey PUmp set with flush bag 1 each 5  . fluconazole (DIFLUCAN) 100 MG tablet Take 2 tabs day 1 then one tablet daily until gone 11 tablet 0  . fluticasone (FLONASE) 50 MCG/ACT nasal spray Place 2 sprays into both nostrils daily. 16 g 1  . Incontinence Supplies (BARD LEG BAG STRAPS/FABRIC) MISC Bard Dispoz-a-Bag leg bag w/flip flo Valve, sterile, w/Gabric strap, 18" extension tubing 19 oz  Item ID:[id number] 2 each 6  . Incontinence Supply Disposable (PREVAIL BREEZERS MEDIUM) MISC pkg of 16- size medium 32" to 44"  Breathable cloth-like outer fabric (can't use the plastic outer surgace  Item # [id number]/2 16 each 6  . LORazepam (ATIVAN) 1 MG tablet Take one tablet in evenings as needed for insomnia. 30 tablet 1  . metoprolol tartrate (LOPRESSOR) 25 MG tablet Take 25 mg by mouth 2 (two) times daily as needed. Reported on 11/21/2015    . NON FORMULARY Bard Leg Bag Extension tubing w/Connector 18", Sterile, latex-free  Item# [id number]    . NON FORMULARY Colorplast Freedom Cath  Latex Self-Adhering Male External Catheter 8mm Diameter Intermediate  Item# [id number]    . NONFORMULARY OR COMPOUNDED ITEM Covidien REF [id number] - Kangaroo Joey Pump Set with Flush Bags - 1000 mL 1 each 0  . Nutritional Supplements (FEEDING SUPPLEMENT, JEVITY 1.5 CAL,) LIQD Run tube feed at 70 mL per hour over 17 hours. 35550 mL 11  . nystatin cream (MYCOSTATIN) Apply 1 application topically 2 (two) times daily as needed for dry skin. 30 g 1  . OLANZapine (ZYPREXA) 2.5 MG tablet Take 1 tablet (2.5 mg total) by mouth 2 (two) times daily as needed.    Marland Kitchen OLANZapine (ZYPREXA) 5 MG tablet Take 5 mg by mouth at bedtime.    Earney Navy Bicarbonate (  ZEGERID) 20-1100 MG CAPS capsule Take 1 capsule by mouth daily before breakfast.    . Ostomy Supplies (PROTECTIVE BARRIER WIPES) MISC 1-1/4" X 3"  Item [id number] 75 each 6  . oxybutynin (DITROPAN) 5 MG/5ML syrup Take 5 mg by mouth 2 (two) times daily.     Marland Kitchen PARoxetine (PAXIL) 10 MG tablet Take 15 mg by mouth daily.    Marland Kitchen PRESCRIPTION MEDICATION Colorplast Freedom Cath Latex Self-Adhering Male External Catheter 25mm Diameter Intermediate  Item# [id number]     No current facility-administered medications on file prior to visit.    BP 106/72 mmHg  Pulse 86  Temp(Src) 98.1 F (36.7 C) (Oral)  Wt   SpO2 98%       Objective:   Physical Exam  General  Mental Status - Alert. General Appearance - Well groomed. Not in acute distress.  Skin Rashes- No Rashes.  HEENT Head- Normal. Ear Auditory Canal - Left- Normal. Right - Normal.Tympanic Membrane- Left- Normal. Right- Normal. Eye Sclera/Conjunctiva- Left- Normal. Right- Normal. Nose & Sinuses Nasal Mucosa- Left-  Not boggy or Congested. Right-  Not  boggy or Congested. Mouth & Throat Lips: Upper Lip- Normal: no dryness, cracking, pallor, cyanosis, or vesicular eruption. Lower Lip-Normal: no dryness, cracking, pallor, cyanosis or vesicular eruption. Buccal Mucosa- Bilateral- No Aphthous  ulcers. Oropharynx- No Discharge or Erythema. Tonsils: Characteristics- Bilateral- No Erythema or Congestion. Size/Enlargement- Bilateral- No enlargement. Discharge- bilateral-None.  Neck Neck- Supple. No Masses.   Chest and Lung Exam Auscultation: Breath Sounds:- even and unlabored, but  Very faint  bilateral upper lobe rhonchi. Faint expiratory wheeze.  Cardiovascular Auscultation:Rythm- Regular, rate and rhythm. Murmurs & Other Heart Sounds:Ausculatation of the heart reveal- No Murmurs.  Lymphatic Head & Neck General Head & Neck Lymphatics: Bilateral: Description- No Localized lymphadenopathy.       Assessment & Plan:  Concern for bronchitis but did not improve with zpack. Will get cxr today and see if any indication of pneumonia.  Pt cough may be allergic component vs reactive airways. Will rx hydrodocone to use at night if needed. Advise to use fllonase spray.  Pt has neb machine can use treatment every 6 hours.   Will follow cxr and see how he responds to above. May need second antibiotic but holding off for now unless cxr +.  If these symptoms persist and cxr negative may get pulmonologist opinion.

## 2016-01-02 NOTE — Patient Instructions (Addendum)
Concern for bronchitis but did not improve with zpack. Will get cxr today and see if any indication of pneumonia.  Pt cough may be allergic component vs reactive airways. Will rx hydrodocone to use at night if needed. Advise to use fllonase spray.  Pt has neb machine can use treatment every 6 hours.   Will follow cxr and see how he responds to above. May need second antibiotic but holding off for now unless cxr +.  If these symptoms persist and cxr negative may get pulmonologist opinion.  Follow up in 7 days or as needed

## 2016-01-03 ENCOUNTER — Telehealth: Payer: Self-pay | Admitting: *Deleted

## 2016-01-03 MED ORDER — LEVOFLOXACIN 500 MG PO TABS: 500.0000 mg | ORAL_TABLET | Freq: Every day | ORAL | Status: DC

## 2016-01-03 NOTE — Telephone Encounter (Signed)
Received request for Signed/Dated list of Medication for patient; faxed/SLS 03/03

## 2016-01-03 NOTE — Telephone Encounter (Signed)
Referral placed for pt. To Dr. Elsworth Soho.

## 2016-01-06 NOTE — Telephone Encounter (Signed)
In [facility] work que/awaiting appt

## 2016-01-08 ENCOUNTER — Other Ambulatory Visit (INDEPENDENT_AMBULATORY_CARE_PROVIDER_SITE_OTHER): Payer: Medicare Other

## 2016-01-08 DIAGNOSIS — R300 Dysuria: Secondary | ICD-10-CM

## 2016-01-08 LAB — POC URINALSYSI DIPSTICK (AUTOMATED)
Bilirubin, UA: NEGATIVE
Blood, UA: NEGATIVE
Glucose, UA: NEGATIVE
Ketones, UA: NEGATIVE
Leukocytes, UA: NEGATIVE
Nitrite, UA: NEGATIVE
Protein, UA: NEGATIVE
Spec Grav, UA: 1.015
Urobilinogen, UA: 0.2
pH, UA: 8.5

## 2016-01-09 ENCOUNTER — Encounter: Payer: Self-pay | Admitting: Medical

## 2016-01-09 ENCOUNTER — Encounter: Payer: Self-pay | Admitting: Family Medicine

## 2016-01-09 DIAGNOSIS — G800 Spastic quadriplegic cerebral palsy: Secondary | ICD-10-CM | POA: Diagnosis not present

## 2016-01-12 LAB — URINE CULTURE: Colony Count: >=100000

## 2016-01-13 ENCOUNTER — Encounter: Payer: Self-pay | Admitting: Family Medicine

## 2016-01-21 ENCOUNTER — Telehealth: Payer: Self-pay | Admitting: Family Medicine

## 2016-01-21 DIAGNOSIS — R05 Cough: Secondary | ICD-10-CM

## 2016-01-21 DIAGNOSIS — R059 Cough, unspecified: Secondary | ICD-10-CM

## 2016-01-21 NOTE — Addendum Note (Signed)
Addended by: Sharon Seller B on: 01/21/2016 05:47 PM   Modules accepted: Orders

## 2016-01-21 NOTE — Telephone Encounter (Signed)
Spoke to his mom and patient is doing better.  Has completed levaquin.  suddently Friday night had a lot of congestion and cough. Sunday night cough med. That Edward prescribed worked well.  Nights are better, but still has a congested cough, not having to suction as much,  No fever. Mom thinks doing ok at home handling it, humidifier runnning, essentials oils.  Patient states his chest doesn't feel well and patient does not want to go to the day program tomorrow due to not feeling better.  Wondererd if he needs another chest xray? Mom has also increase his flush bags to try to thin mucous.   Cell phone number [phone]

## 2016-01-21 NOTE — Telephone Encounter (Signed)
Caller name:Linda Relationship to patient: Mother Can be reached: [phone]   Reason for call: Mom wants to explain what has been going on with patient and see if he needs another antibiotic

## 2016-01-21 NOTE — Telephone Encounter (Signed)
OK to order CXR for cough

## 2016-01-21 NOTE — Telephone Encounter (Signed)
Mom informed chest xray ordered and do at convenience.

## 2016-01-21 NOTE — Telephone Encounter (Signed)
Put order in for chest xray. Called the mom on her cell number, no answer/no voice mail.

## 2016-01-22 ENCOUNTER — Ambulatory Visit (HOSPITAL_BASED_OUTPATIENT_CLINIC_OR_DEPARTMENT_OTHER)
Admission: RE | Admit: 2016-01-22 | Discharge: 2016-01-22 | Disposition: A | Discharge status: Home / Self Care | Payer: Medicare Other | Source: Ambulatory Visit | Attending: Family Medicine | Admitting: Family Medicine

## 2016-01-22 DIAGNOSIS — R059 Cough, unspecified: Secondary | ICD-10-CM

## 2016-01-22 DIAGNOSIS — R05 Cough: Secondary | ICD-10-CM | POA: Insufficient documentation

## 2016-01-22 DIAGNOSIS — R918 Other nonspecific abnormal finding of lung field: Secondary | ICD-10-CM | POA: Diagnosis not present

## 2016-01-24 ENCOUNTER — Other Ambulatory Visit: Payer: Self-pay | Admitting: Family Medicine

## 2016-01-24 MED ORDER — AMOXICILLIN-POT CLAVULANATE 875-125 MG PO TABS: 1.0000 | ORAL_TABLET | Freq: Two times a day (BID) | ORAL | Status: DC

## 2016-02-13 ENCOUNTER — Ambulatory Visit: Payer: Self-pay | Admitting: Family Medicine

## 2016-03-09 ENCOUNTER — Telehealth: Payer: Self-pay | Admitting: *Deleted

## 2016-03-09 NOTE — Telephone Encounter (Signed)
Received Disabled Child Attending 2 Statement via mail from pt's Mother for Dental Insurance coverage; forwarded to provider/SLS 05/08

## 2016-03-12 NOTE — Telephone Encounter (Signed)
Completed forms mailed to pt's mother in envelope provided. Copy sent for scanning. JG//CMA

## 2016-03-26 ENCOUNTER — Ambulatory Visit: Payer: Self-pay | Admitting: Family Medicine

## 2016-03-31 DIAGNOSIS — F339 Major depressive disorder, recurrent, unspecified: Secondary | ICD-10-CM | POA: Diagnosis not present

## 2016-04-01 ENCOUNTER — Encounter: Payer: Self-pay | Admitting: Internal Medicine

## 2016-04-01 ENCOUNTER — Encounter: Payer: Self-pay | Admitting: Family Medicine

## 2016-04-01 ENCOUNTER — Encounter: Payer: Self-pay | Admitting: Pulmonary Disease

## 2016-04-10 ENCOUNTER — Ambulatory Visit: Payer: Self-pay | Admitting: Family Medicine

## 2016-04-14 ENCOUNTER — Ambulatory Visit (INDEPENDENT_AMBULATORY_CARE_PROVIDER_SITE_OTHER): Payer: Medicare Other | Admitting: Family Medicine

## 2016-04-14 ENCOUNTER — Encounter: Payer: Self-pay | Admitting: Family Medicine

## 2016-04-14 VITALS — BP 116/77 | HR 88 | Wt 83.2 lb

## 2016-04-14 DIAGNOSIS — G8250 Quadriplegia, unspecified: Secondary | ICD-10-CM

## 2016-04-14 DIAGNOSIS — G800 Spastic quadriplegic cerebral palsy: Secondary | ICD-10-CM

## 2016-04-14 DIAGNOSIS — K117 Disturbances of salivary secretion: Secondary | ICD-10-CM

## 2016-04-14 DIAGNOSIS — G809 Cerebral palsy, unspecified: Secondary | ICD-10-CM | POA: Insufficient documentation

## 2016-04-14 DIAGNOSIS — R1314 Dysphagia, pharyngoesophageal phase: Secondary | ICD-10-CM

## 2016-04-14 NOTE — Assessment & Plan Note (Signed)
Patient has been on tube feeds only for some time now.  Mom tells me that there has been no change in patient's tolerance of these in the recent past.

## 2016-04-14 NOTE — Assessment & Plan Note (Signed)
Recommend mother perform more frequent suctioning of his oral pharyngeal cavities.  Use chest physiotherapy vest per protocol and advised mother to call that specialist, pulmonology, to see if that can be increased in freq at all.

## 2016-04-14 NOTE — Assessment & Plan Note (Signed)
Stable per mom, no acute changes.

## 2016-04-14 NOTE — Progress Notes (Signed)
[name], D.O. Primary care at [facility]  Subjective:    CC: New pt, Mom brought pt here to discuss possible benefits/ disadvantages of changing PCP  HPI: [name] appears to be a happy 30 y.o. male who presents to [place] at [facility] today accompanied by his mother and caregiver today.  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  Patients mom is his primary caregiver and tells me today that [name] understands everything that is said to him and generally is quite pleasant per MOm.   She has help - home care- for a couple hours 3 days per week.  Patient also goes to a day program 2 times per week "after [facility]."       He has a lot of issues with secretions and congestion in his chest and upper airways.  He has a physiotherapeutic vest that he wears twice daily to increase his mucociliary elevatation; she also suctions him out multiple times throughout the day.     CC:  Mom wants me to listen to him today and make sure he sounds clear.  Mom thinks patient has been having more secretions than usual lately.  No fever, no difficulty in breathing more than his normal.  Energy level and awareness within normal limits.  No lethargy  Patient's mom states that she overall she is very happy with the care she is receiving over at [facility] through patient's current PCP but would like it to be closer.  She tells me today she is looking for the ability to get him in as needed even on short notice and have after hours care.  She has concerns today that since I am only 1 provider, there will be limited access.  She also has concerns that we do not have an emergency room or urgent care attached to our clinic.  She is looking for a guarantee from me today that [name] will be able to get in to see me at any time when he is acutely ill.  I further explained that our x-ray  machine is not functional and we do not know of an exact time it will be.    PCP: Dr. Gwyneth Revels - primary care - [facility], last seen 01-02-16  Dr. Marco Collie gastroenterology- last seen December 2012.  Dr. Liliana Cline neurology at [facility] health: Last seen February 2017  Dr. Kyra Searles neurologist at [facility] for Botox injections, last seen February 2017  Dr. Agapito Games neuropsychiatry from [place], [place] last seen May 2017  Dr. Lavell Anchors pulmonology- last seen January 2017  Dr. Bjorn Loser- [facility] last seen August 2016  Dr. Tawni Levy: Rehabilitation medicine and Botox injections, last seen January 2017  Dr. Earnie Larsson hey: Headache clinic for scoliosis and spine surgery in [place], [place] seen January 2016  Dr. Lisabeth Register, general surgeon in [place]          Past Medical History  Diagnosis Date  . Cerebral palsy ([place])   . GERD (gastroesophageal reflux disease)   . Hyperthyroidism   . Incontinence of feces   . Palpitations   . Esophagitis 2011  . Dehydration 11/22/2013  . Loss of weight 08/28/2014  . Depression with anxiety 08/01/2010    Qualifier: Diagnosis of  By: Nelson-Smith CMA (AAMA), Dottie    .  Gastrostomy in place [facility]) 08/31/2013  . Medicare annual wellness visit, subsequent 08/25/2015  . Hyperlipidemia, mild 08/25/2015    Past Surgical History  Procedure Laterality Date  . Spine surgery  ,11/20/2010, 2011    for correction of severe contracturing spinal kyphosis.   . Eye surgery    . Ears tubes    . Hamstring released      to treat contractures.   . Baclofen trial    . Baslofen pump implant    . Spinal fusion    . G-tube insert  August 2006  . Spinal fusioncorrect 106 degree kyphosis    . Spinal fusion to correct 70 degree kyphosis  11-2010  . Tonsillectomy    . Peg placement  10/21/2011    Procedure: PERCUTANEOUS  ENDOSCOPIC GASTROSTOMY (PEG) REPLACEMENT;  Surgeon: [name], MD;  Location: [facility] ENDOSCOPY;  Service: Endoscopy;  Laterality: N/A;  . Peg placement N/A 06/13/2013    Procedure: PERCUTANEOUS ENDOSCOPIC GASTROSTOMY (PEG) REPLACEMENT;  Surgeon: [name], MD;  Location: [facility] ENDOSCOPY;  Service: Endoscopy;  Laterality: N/A;  . Hip surgery      x2 , side   . Flexible sigmoidoscopy N/A 09/07/2014    Procedure: FLEXIBLE SIGMOIDOSCOPY;  Surgeon: [name], MD;  Location: [facility] ENDOSCOPY;  Service: Endoscopy;  Laterality: N/A;    Family History  Problem Relation Age of Onset  . Asthma Mother   . Hyperlipidemia Mother   . COPD Mother   . Other Mother     bronchial stasis/ABPA  . Cancer Maternal Grandmother 25    breast  . Hyperlipidemia Maternal Grandmother   . Hypertension Maternal Grandmother   . Cancer Maternal Grandfather     prostate  . Heart disease Paternal Grandfather     CHF  . Osteoporosis Paternal Grandmother   . Arthritis Paternal Grandmother     rheumatoid    History  Drug Use No  ,  History  Alcohol Use No  ,  History  Smoking status  . Never Smoker   Smokeless tobacco  . Never Used  ,  History  Sexual Activity  . Sexual Activity: No    Patient's Medications  New Prescriptions   No medications on file  Previous Medications   AMBULATORY NON FORMULARY MEDICATION    Medication Name: MIC gastrostomy/bolus feeding tube 24 French Part number 0110-24. #2 and 10 cc lurer lock syringe #2 Dx:   BACITRACIN 500 UNIT/GM OINTMENT    Apply 1 application topically 2 (two) times daily.   CLOTRIMAZOLE-BETAMETHASONE (LOTRISONE) CREAM    Apply 1 application topically 2 (two) times daily.   DANTROLENE (DANTRIUM) 50 MG CAPSULE    TAKE 1 CAPSULE BY MOUTH 3 TIMES A DAY.   DIVALPROEX (DEPAKOTE SPRINKLE) 125 MG CAPSULE    Take 2 capsules in morning, 5 capsules at bedtime   FEEDING TUBES - BAGS (FLEXIFLO FEEDING BAG/PUMP) MISC    1 Units by Does not apply route continuous.    FEEDING TUBES - PUMP MISC    1 Units by Does not apply route continuous.   FEEDING TUBES - SETS (KANGAROO EPUMP SET 1000ML) MISC    30 day supply of Kangaroo Joey PUmp set with flush bag   FLUTICASONE (FLONASE) 50 MCG/ACT NASAL SPRAY    Place 2 sprays into both nostrils daily.   INCONTINENCE SUPPLIES (BARD LEG BAG STRAPS/FABRIC) MISC    Bard Dispoz-a-Bag leg bag w/flip flo Valve, sterile, w/Gabric strap, 18" extension tubing 19 oz  Item ID:[id number]  INCONTINENCE SUPPLY DISPOSABLE (PREVAIL BREEZERS MEDIUM) MISC    pkg of 16- size medium 32" to 44"  Breathable cloth-like outer fabric (can't use the plastic outer surgace  Item # [id number]   LORAZEPAM (ATIVAN) 1 MG TABLET    Take one tablet in evenings as needed for insomnia.   NON FORMULARY    Bard Leg Bag Extension tubing w/Connector 18", Sterile, latex-free  Item# [id number]   NON FORMULARY    Colorplast Freedom Cath Latex Self-Adhering Male External Catheter 57mm Diameter Intermediate  Item# [id number]   NONFORMULARY OR COMPOUNDED Suzy Bouchard REF [id number] - Kangaroo Joey Pump Set with Flush Bags - 1000 mL   NUTRITIONAL SUPPLEMENTS (FEEDING SUPPLEMENT, JEVITY 1.5 CAL,) LIQD    Run tube feed at 70 mL per hour over 17 hours.   NYSTATIN CREAM (MYCOSTATIN)    Apply 1 application topically 2 (two) times daily as needed for dry skin.   OLANZAPINE (ZYPREXA) 2.5 MG TABLET    Place 1 tablet into feeding tube 3 (three) times daily.   OMEPRAZOLE-SODIUM BICARBONATE (ZEGERID) 20-1100 MG CAPS CAPSULE    Take 1 capsule by mouth daily before breakfast.   OSTOMY SUPPLIES (PROTECTIVE BARRIER WIPES) MISC    1-1/4" X 3"  Item [id number]   PAROXETINE (PAXIL) 10 MG TABLET    Take 15 mg by mouth daily.   PRESCRIPTION MEDICATION    Colorplast Freedom Cath Latex Self-Adhering Male External Catheter 58mm Diameter Intermediate  Item# [id number]  Modified Medications   No medications on file  Discontinued Medications   AMOXICILLIN-CLAVULANATE (AUGMENTIN) 875-125 MG TABLET     Take 1 tablet by mouth 2 (two) times daily. Take for 10 days   FLUCONAZOLE (DIFLUCAN) 100 MG TABLET    Take 2 tabs day 1 then one tablet daily until gone   HYDROCODONE-HOMATROPINE (HYCODAN) 5-1.5 MG/5ML SYRUP    Take 5 mLs by mouth every 8 (eight) hours as needed for cough.   LEVOFLOXACIN (LEVAQUIN) 500 MG TABLET    Take 1 tablet (500 mg total) by mouth daily.   METOPROLOL TARTRATE (LOPRESSOR) 25 MG TABLET    Take 25 mg by mouth 2 (two) times daily as needed. Reported on 11/21/2015   OLANZAPINE (ZYPREXA) 2.5 MG TABLET    Take 1 tablet (2.5 mg total) by mouth 2 (two) times daily as needed.   OLANZAPINE (ZYPREXA) 5 MG TABLET    Take 5 mg by mouth at bedtime.   OXYBUTYNIN (DITROPAN) 5 MG/5ML SYRUP    Take 5 mg by mouth 2 (two) times daily.     ALLERGIES: Ambien; Antihistamines, chlorpheniramine-type; Codeine; Baclofen; and Sulfonamide derivatives  Review of Systems: General:   Denies fever, chills Optho/Auditory:   Denies visual changes Respiratory:   Denies SOB, +cough, no wheezing.  Cardiovascular:   Denies chest pain, + palpitations on occ when gets excited Gastrointestinal:   Denies vomiting, diarrhea.  Genitourinary:    Denies dysuria, increased frequency  Musculoskeletal:  Denies any acute change in functional status.  Skin:  Denies rash Psychiatric/Behavioral:   Denies mood changes   Objective:   Blood pressure 116/77, pulse 88, weight 83 lb 3.2 oz (37.739 kg). Body mass index is 15.21 kg/(m^2).  General: Pleasant,  in no acute distress.  Neuro: non-verbal, alert Skin: no new rashes  Cardiac: Regular rate and rhythm, no murmurs rubs or gallops.  Respiratory: Essentially clear to auscultation bilaterally Abdominal: Soft, not grossly distended Musculoskeletal: Severe flexion of the wrists and arms bilaterally, also bilateral lower extremities.  Vasc: less 2 sec cap RF, warm and pink  Psych:  No HI/SI per MOM    Impression and Recommendations:    Reassured mother that  lung sounds are clear and abnormalities I hear are from upper airway secretions.  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  Reassured mom that lower airways sound clear today  Dysphagia, pharyngoesophageal phase Patient has been on tube feeds only for some time now.  Mom tells me that there has been no change in patient's tolerance of these in the recent past.  Increased oropharyngeal secretions Recommend mother perform more frequent suctioning of his oral pharyngeal cavities.  Use chest physiotherapy vest per protocol and advised mother to call that specialist, pulmonology, to see if that can be increased in freq at all.  I discussed with mother in a very frank way that I cannot promise we will always get patient in every time she calls for acute care visit.  Explained we do not have functional x-ray as of yet but will in the future.  Told her I could not promise her a date that we'll be functional.  She tells me today.  She will think about the pluses and minuses and consider whether or not she will transfer his care to our clinic.  Patient will return in 2 days for an office visit with me and transfer of her own personal care to [place].  - parent was counselled, risk factors / benefits were discussed of transferring patients care to here, anticipatory guidance given.  Note: This document was prepared using Dragon voice recognition software and may include unintentional dictation errors.

## 2016-04-14 NOTE — Assessment & Plan Note (Signed)
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  Reassured mom that lower airways sound clear today

## 2016-05-01 ENCOUNTER — Telehealth: Payer: Self-pay | Admitting: Internal Medicine

## 2016-05-01 DIAGNOSIS — G800 Spastic quadriplegic cerebral palsy: Secondary | ICD-10-CM | POA: Diagnosis not present

## 2016-05-01 NOTE — Telephone Encounter (Signed)
Mother states that for about 3 weeks pt has been having more issues at night with reflux. Reports they have had to stop the feeding about half way through the night due to reflux. Mother wonders if they need to go back to G-J tube. Please advise.

## 2016-05-02 NOTE — Telephone Encounter (Signed)
Options include referral back to IR for G-J tube placement Or Trial of reglan 10 mg per tube 1 hour before noctural feeding (this may help gastric emptying and prevent reflux during that time) His mother is very experienced with this and either of the above can be pursued based on her preference

## 2016-05-06 MED ORDER — METOCLOPRAMIDE HCL 5 MG/5ML PO SOLN: 10.0000 mg | Freq: Every day | ORAL | Status: DC

## 2016-05-06 NOTE — Telephone Encounter (Signed)
Spoke with pts mother and she is aware. She would like to try the reglan and see if that helps. Script sent to pharmacy.

## 2016-05-12 ENCOUNTER — Encounter: Payer: Self-pay | Admitting: Internal Medicine

## 2016-05-12 ENCOUNTER — Encounter: Payer: Self-pay | Admitting: Adult Health

## 2016-05-12 ENCOUNTER — Ambulatory Visit (INDEPENDENT_AMBULATORY_CARE_PROVIDER_SITE_OTHER): Payer: Medicare Other | Admitting: Adult Health

## 2016-05-12 ENCOUNTER — Ambulatory Visit (INDEPENDENT_AMBULATORY_CARE_PROVIDER_SITE_OTHER)
Admission: RE | Admit: 2016-05-12 | Discharge: 2016-05-12 | Disposition: A | Discharge status: Home / Self Care | Payer: Medicare Other | Source: Ambulatory Visit | Attending: Adult Health | Admitting: Adult Health

## 2016-05-12 VITALS — BP 140/80 | HR 93 | Temp 98.0°F | Ht 62.0 in | Wt 94.0 lb

## 2016-05-12 DIAGNOSIS — J209 Acute bronchitis, unspecified: Secondary | ICD-10-CM | POA: Diagnosis not present

## 2016-05-12 DIAGNOSIS — J40 Bronchitis, not specified as acute or chronic: Secondary | ICD-10-CM

## 2016-05-12 DIAGNOSIS — R05 Cough: Secondary | ICD-10-CM | POA: Diagnosis not present

## 2016-05-12 NOTE — Assessment & Plan Note (Signed)
Will check cxr today  Appears to more secretion clearance. Try to use VEST more often   Plan  Chest xray today  Increase VEST use Twice daily  .  Follow up with Dr. Elsworth Soho  In [facility] in 6 months and As needed  Please contact office for sooner follow up if symptoms do not improve or worsen or seek emergency care

## 2016-05-12 NOTE — Patient Instructions (Addendum)
Chest xray today  Increase VEST use Twice daily  .  Follow up with Dr. Elsworth Soho  In [facility] in 6 months and As needed  Please contact office for sooner follow up if symptoms do not improve or worsen or seek emergency care

## 2016-05-12 NOTE — Progress Notes (Signed)
   Subjective:    Patient ID: [name], male    DOB: 1985-11-05, 30 y.o.   MRN: [id number]  HPI 30 year old male.- accompanied by mother for follow-up of recurrent pneumonias due to poor secretion clearance. He has cerebral palsy and needs assistance for all activities of daily living, baseline wheelchair-bound. Underwent T9-C6 spinal fusion at [facility] in Sept 2011.  PEG placed in 2012 for hydration.  He was hospitalized in 08/2013 for aspiration pneumonia,Swallow evaluation showed severe dysphagia with high risk of aspirating. He has 4 caregivers at home.  Started back on tube feeds exclusively due to aspiration 07/2014  01/30/2015 - PEJ placed   05/12/2016  Chief Complaint  Patient presents with  . Acute Visit    RA pt here c/o chest congestion with yellow mucus. Denies any sinus congestion/drainage, fever, nausea or vomiting.    Accompanied by mother  Has GJ tube on tube feeds  Having issues with feedings, started on reglan but not able to tolerate.   He continues to have problems with secretion clearance, complains of a wet cough,  With thicker white to yellow mucus. No fever or hemoptyiss. Uses VEST daily now . Used to do 2-3 times a day.  Last abx in March 2017. Mother requests chest xray .    01/2015 CT chest with contrast -Volume loss in the RIGHT lower lobe with rounding of vessels suggesting rounded atelectasis, Pleural fluid collection at posterior inferior RIGHT hemi thorax    Review of Systems neg for any significant sore throat, dysphagia, itching, sneezing, nasal congestion or excess/ purulent secretions, fever, chills, sweats, unintended wt loss, pleuritic or exertional cp, hempoptysis, orthopnea pnd or change in chronic leg swelling. Also denies presyncope, palpitations, heartburn, abdominal pain, nausea, vomiting, diarrhea or change in bowel or urinary habits, dysuria,hematuria, rash, arthralgias, visual complaints, headache, numbness weakness or  ataxia.      Objective:   Physical Exam Filed Vitals:   05/12/16 1502  BP: 140/80  Pulse: 93  Temp: 98 F (36.7 C)  TempSrc: Oral  Height: 5\' 2"  (1.575 m)  Weight: 94 lb (42.638 kg)  SpO2: 97%    Gen. Pleasant, thin, in no distress, in wheelchair, moaning  ENT - no lesions, no post nasal drip Neck: No JVD, no thyromegaly, no carotid bruits Lungs: Scoliosis no use of accessory muscles, no dullness to percussion, few trace rhonchi, clears with cough.   Cardiovascular: Rhythm regular, heart sounds  normal, no murmurs or gallops, no peripheral edema Musculoskeletal: Scoliosis, no cyanosis or clubbing     [name] Parrett NP-C  [facility]  05/12/2016

## 2016-05-13 ENCOUNTER — Other Ambulatory Visit: Payer: Self-pay | Admitting: Internal Medicine

## 2016-05-13 NOTE — Progress Notes (Signed)
Quick Note:  Called spoke with pt's mother. Reviewed results and recs. She voiced understanding and had no further questions. Nothing further needed. ______

## 2016-05-21 ENCOUNTER — Encounter: Payer: Self-pay | Admitting: Adult Health

## 2016-05-21 ENCOUNTER — Telehealth: Payer: Self-pay | Admitting: Pulmonary Disease

## 2016-05-21 DIAGNOSIS — R1312 Dysphagia, oropharyngeal phase: Secondary | ICD-10-CM

## 2016-05-21 NOTE — Telephone Encounter (Signed)
Number listed is unavailable at this time.  LM on other number listed on chart for [name] - [phone] Will have to call back in the AM

## 2016-05-21 NOTE — Telephone Encounter (Signed)
MyChart Message:  [name] / Dr. Elsworth Soho,     [name] has had this continual gurgly cough since June 1 w/LOT of mucous and we are doing a LOT of suctioning.    Lung x-rays are clear, so we are trying to understand causes and what our next step of action might be.    Also been in touch with Dr. Hilarie Fredrickson and tried adding a 2nd Reflux medication, REGLAN, but adversely affected his mental health and behavior, as [name] witnessed at the latest appt.    We have since tried a different formula for his feeds - no difference, but it was just an OTC Ensure 350 calorie formula. Wondering if it might be worthwhile to consult a nutritionist if you think a different formula reduce mucous/chronic gurgly cough?    Also talked with Dr. Hilarie Fredrickson about replacing G-tube with G/J tube as our next step to see if that might help.    Otherwise, is this decline and maybe [name] has dropped down a step or two with the dysphagia worsening?     Another next step would be contacting Dr. Nicoletta Ba, his Neurologist at [facility] to get her input?    Marya Fossa    Dr. Elsworth Soho, please advise.

## 2016-05-21 NOTE — Telephone Encounter (Signed)
Mucinex twice daily Send sputum culture OK to consult nutritionist for diff formula

## 2016-05-22 ENCOUNTER — Encounter: Payer: Self-pay | Admitting: Pulmonary Disease

## 2016-05-22 NOTE — Telephone Encounter (Signed)
Called spoke with pt's mother. Reviewed RA's recs. She states she sent an email this morning stating she would like to have the patient seen in office. I offered an ov but she refused stating she would try the mucinex over the weekend and call back on Monday if he was no better. She voiced understanding and had no further questions. Nothing further needed.

## 2016-05-22 NOTE — Telephone Encounter (Signed)
See phone note 05/21/16. Estill Bamberg spoke with pt mother today

## 2016-05-27 ENCOUNTER — Telehealth: Payer: Self-pay

## 2016-05-27 ENCOUNTER — Telehealth: Payer: Self-pay | Admitting: *Deleted

## 2016-05-27 NOTE — Telephone Encounter (Signed)
Received confirmation of Verbal order from [facility],forwarded to Dr. Charlett Blake for signature.

## 2016-05-27 NOTE — Telephone Encounter (Signed)
Physician orders signed and faxed.  Confirmation received.//AB/CMA

## 2016-06-02 ENCOUNTER — Telehealth: Payer: Self-pay | Admitting: *Deleted

## 2016-06-02 NOTE — Telephone Encounter (Signed)
Received completed and signed paperwork from Dr. Charlett Blake.  All paperwork faxed to [place] at [phone]).  Confirmation received.//AB/CMA   Received Prescriber Orders paperwork via fax from [place].  Placed in folder for Dr. Charlett Blake to review and sign.//AB/CMA

## 2016-06-04 ENCOUNTER — Ambulatory Visit (INDEPENDENT_AMBULATORY_CARE_PROVIDER_SITE_OTHER): Payer: Medicare Other | Admitting: Adult Health

## 2016-06-04 ENCOUNTER — Encounter: Payer: Self-pay | Admitting: Adult Health

## 2016-06-04 DIAGNOSIS — J209 Acute bronchitis, unspecified: Secondary | ICD-10-CM

## 2016-06-04 MED ORDER — AZITHROMYCIN 200 MG/5ML PO SUSR: 250.0000 mg | Freq: Every day | ORAL | 0 refills | Status: AC

## 2016-06-04 NOTE — Patient Instructions (Addendum)
Azithromycin per tube for 7 days .  Continue on VEST Twice daily  .  Mucinex liquid per tube As needed   Please contact office for sooner follow up if symptoms do not improve or worsen or seek emergency care  Follow up Dr Elsworth Soho  6 month and As needed

## 2016-06-05 NOTE — Assessment & Plan Note (Signed)
Flare   Plan  Azithromycin per tube for 7 days .  Continue on VEST Twice daily  .  Mucinex liquid per tube As needed   Please contact office for sooner follow up if symptoms do not improve or worsen or seek emergency care  Follow up Dr Elsworth Soho  6 month and As needed

## 2016-06-05 NOTE — Progress Notes (Signed)
Subjective:    Patient ID: [name], male    DOB: [birth year], 30 y.o.   MRN: [id number]  HPI 30 year old male.- accompanied by mother for follow-up of recurrent pneumonias due to poor secretion clearance. He has cerebral palsy and needs assistance for all activities of daily living, baseline wheelchair-bound. Underwent T9-C6 spinal fusion at [facility] in Sept 2011.  PEG placed in 2012 for hydration.  He was hospitalized in 08/2013 for aspiration pneumonia,Swallow evaluation showed severe dysphagia with high risk of aspirating. He has 4 caregivers at home.  Started back on tube feeds exclusively due to aspiration 07/2014  01/30/2015 - PEJ placed   06/05/2016 Acute OV   Chief Complaint  Patient presents with  . Acute Visit    cough, a lot of thick, yellow mucus.  changed formula Friday.  chest congestion, fever.  presents with mom and sitter has noticed more congestion with thick mucus for last few days.  Was having G Tube /TF issues but seems to be better. He is off reglan which he could not tolerate. Changed TF this week , so waiting to see if better .  We discussed VEST use . No vomiting , fever or hemopsytis .  Mood has been better since last ov.   Past Medical History:  Diagnosis Date  . Cerebral palsy ([place])   . Dehydration 11/22/2013  . Depression with anxiety 08/01/2010   Qualifier: Diagnosis of  By: Nelson-Smith CMA (AAMA), Dottie    . Esophagitis 2011  . Gastrostomy in place [facility]) 08/31/2013  . GERD (gastroesophageal reflux disease)   . Hyperlipidemia, mild 08/25/2015  . Hyperthyroidism   . Incontinence of feces   . Loss of weight 08/28/2014  . Medicare annual wellness visit, subsequent 08/25/2015  . Palpitations    Current Outpatient Prescriptions on File Prior to Visit  Medication Sig Dispense Refill  . AMBULATORY NON FORMULARY MEDICATION Medication Name: MIC gastrostomy/bolus feeding tube 24 French Part number 0110-24. #2 and 10 cc lurer lock syringe  #2 Dx: 4 Device 2  . bacitracin 500 UNIT/GM ointment Apply 1 application topically 2 (two) times daily. 30 g 1  . clotrimazole-betamethasone (LOTRISONE) cream Apply 1 application topically 2 (two) times daily. 45 g 1  . dantrolene (DANTRIUM) 50 MG capsule TAKE 1 CAPSULE BY MOUTH 3 TIMES A DAY. 90 capsule 4  . divalproex (DEPAKOTE SPRINKLE) 125 MG capsule Take 2 capsules in morning, 5 capsules at bedtime (Patient taking differently: 250-625 mg. Take 2 capsules in morning, 5 capsules at bedtime) 210 capsule 4  . fluticasone (FLONASE) 50 MCG/ACT nasal spray Place 2 sprays into both nostrils daily. 16 g 1  . Incontinence Supply Disposable (PREVAIL BREEZERS MEDIUM) MISC pkg of 16- size medium 32" to 44"  Breathable cloth-like outer fabric (can't use the plastic outer surgace  Item # [id number] 16 each 6  . LORazepam (ATIVAN) 1 MG tablet Take one tablet in evenings as needed for insomnia. 30 tablet 1  . NON FORMULARY Bard Leg Bag Extension tubing w/Connector 18", Sterile, latex-free  Item# [id number]    . NON FORMULARY Colorplast Freedom Cath Latex Self-Adhering Male External Catheter 85mm Diameter Intermediate  Item# [id number]    . NONFORMULARY OR COMPOUNDED ITEM Covidien REF [id number] - Kangaroo Joey Pump Set with Flush Bags - 1000 mL 1 each 0  . Nutritional Supplements (FEEDING SUPPLEMENT, JEVITY 1.5 CAL,) LIQD Run tube feed at 70 mL per hour over 17 hours. 35550 mL 11  . nystatin cream (  MYCOSTATIN) Apply 1 application topically 2 (two) times daily as needed for dry skin. 30 g 1  . OLANZapine (ZYPREXA) 2.5 MG tablet Place 1 tablet into feeding tube 3 (three) times daily.  3  . Omeprazole-Sodium Bicarbonate (ZEGERID) 20-1100 MG CAPS capsule Take 1 capsule by mouth daily before breakfast.    . Ostomy Supplies (PROTECTIVE BARRIER WIPES) MISC 1-1/4" X 3"  Item [id number] 75 each 6  . PARoxetine (PAXIL) 10 MG tablet Take 15 mg by mouth daily.     No current facility-administered medications on file prior  to visit.        Review of Systems neg for any significant sore throat, dysphagia, itching, sneezing, nasal congestion or excess/ purulent secretions, fever, chills, sweats, unintended wt loss, pleuritic or exertional cp, hempoptysis, orthopnea pnd or change in chronic leg swelling. Also denies presyncope, palpitations, heartburn, abdominal pain, nausea, vomiting, diarrhea or change in bowel or urinary habits, dysuria,hematuria, rash, arthralgias, visual complaints, headache, numbness weakness or ataxia.      Objective:   Physical Exam Vitals:   06/04/16 1216  BP: 105/70  Pulse: 85  Temp: 97.8 F (36.6 C)  TempSrc: Axillary  SpO2: 93%  Weight: 93 lb (42.2 kg)  Height: 5' (1.524 m)    Gen. Pleasant, thin, in no distress, in wheelchair ENT - no lesions, no post nasal drip Neck: No JVD, no thyromegaly, no carotid bruits Lungs: Scoliosis no use of accessory muscles, no dullness to percussion, few trace rhonchi,  Cardiovascular: Rhythm regular, heart sounds  normal, no murmurs or gallops, no peripheral edema Musculoskeletal: Scoliosis, no cyanosis or clubbing     Magen Suriano NP-C  [facility]  06/04/16

## 2016-06-23 DIAGNOSIS — R350 Frequency of micturition: Secondary | ICD-10-CM | POA: Diagnosis not present

## 2016-06-23 DIAGNOSIS — N3941 Urge incontinence: Secondary | ICD-10-CM | POA: Diagnosis not present

## 2016-06-26 IMAGING — DX DG CHEST 2V
2 series · 2 of 2 positions shown · non-contrast
Comparison: February 14, 2015

CLINICAL DATA: Cough and congestion for 10 days

EXAM:
CHEST  2 VIEW

[chest lat]
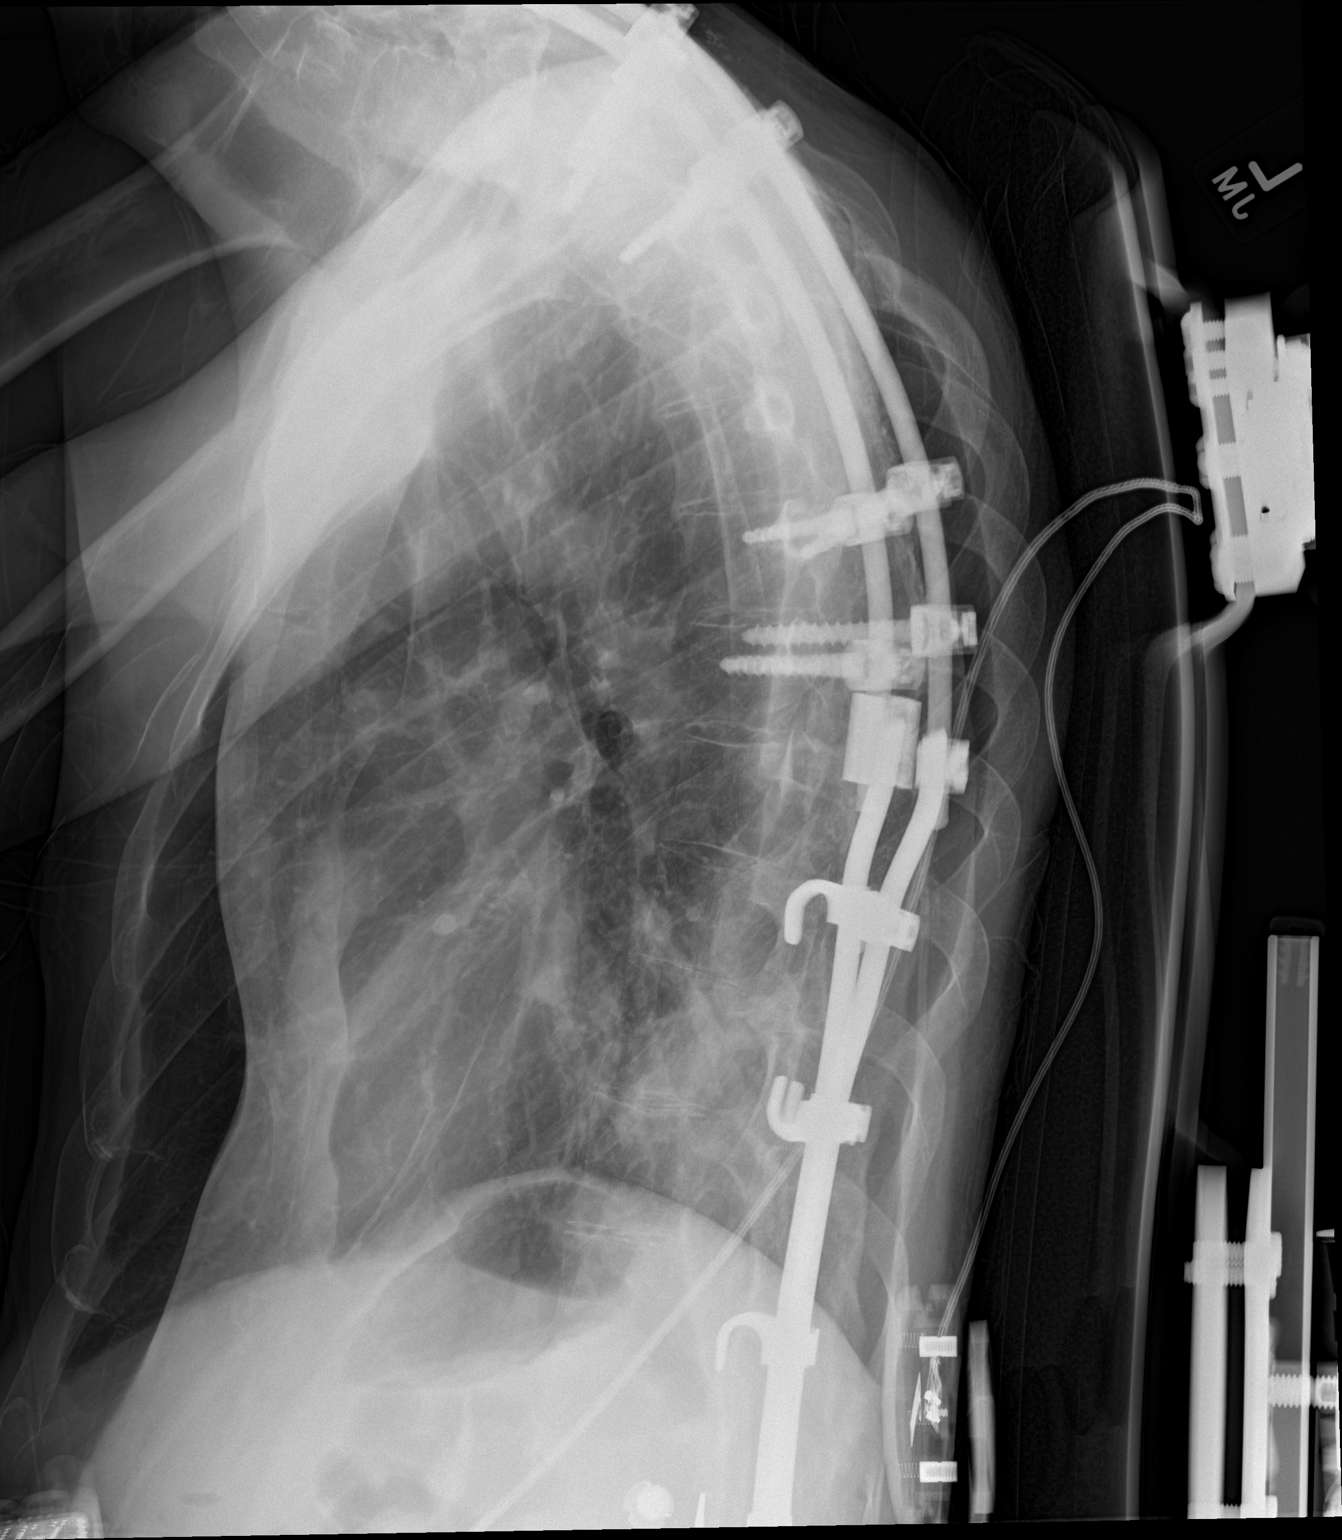

[chest ap]
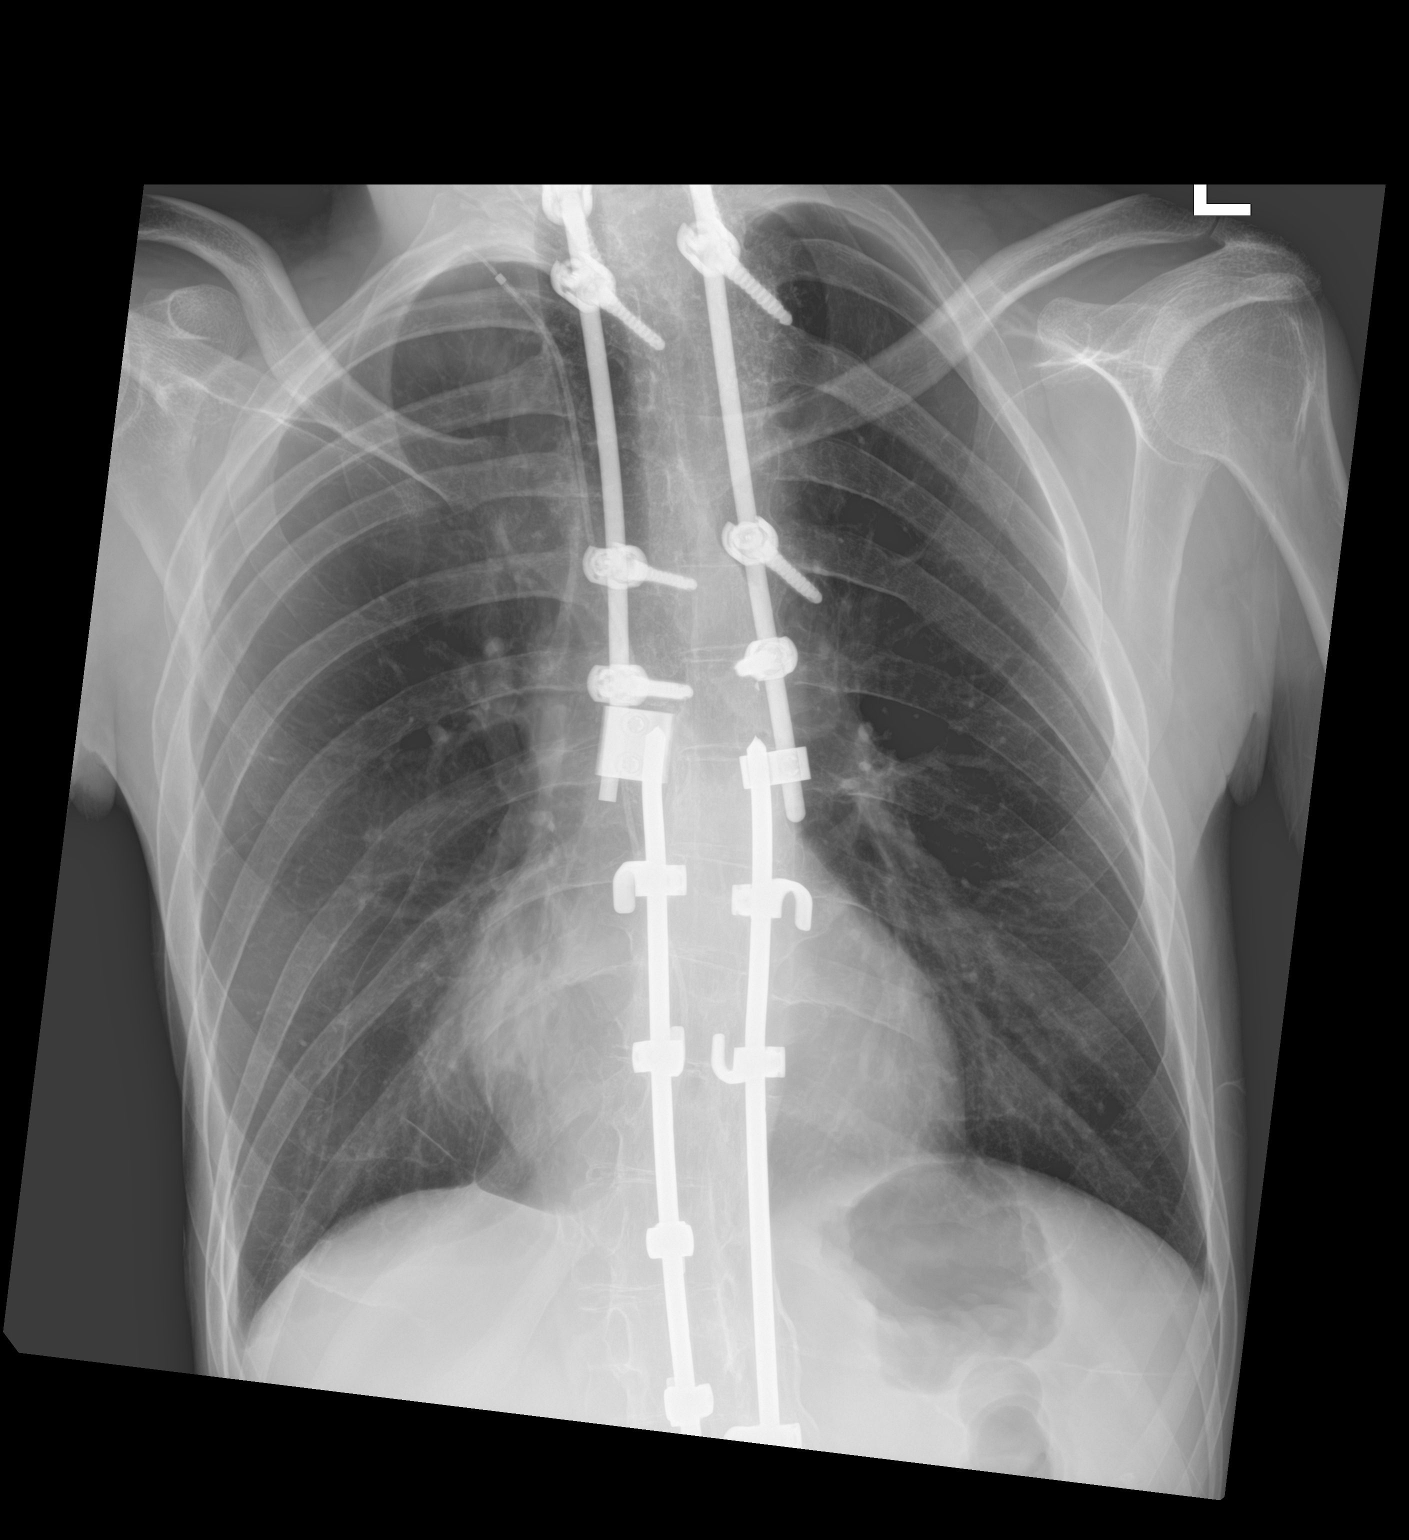

[2 of 2 positions shown; findings below may reference images not displayed]

FINDINGS: The heart size and mediastinal contours are within normal limits.
The lungs are hyperinflated. There is no focal infiltrate, pulmonary
edema, or pleural effusion. Spinal rods are noted.
IMPRESSION: No active cardiopulmonary disease.  Emphysema.

## 2016-06-29 ENCOUNTER — Telehealth: Payer: Self-pay | Admitting: Internal Medicine

## 2016-06-29 ENCOUNTER — Other Ambulatory Visit: Payer: Self-pay

## 2016-06-29 DIAGNOSIS — K9423 Gastrostomy malfunction: Secondary | ICD-10-CM

## 2016-06-29 NOTE — Telephone Encounter (Signed)
Yes this will have to be okay I would advise they hold feeding until tomorrow Can try water for hydration through the tube tonight. If he cannot stay hydrated via tube at home he would need to go to the ED I am sorry to hear the tube is malfunctioning and that they are having trouble.  Hopefully this will be resolved tomorrow

## 2016-06-29 NOTE — Telephone Encounter (Signed)
Patient called back stating that the facility where patient lives just called her and she states that the situation "is now much worse". She is requesting a callback. [phone]

## 2016-06-29 NOTE — Telephone Encounter (Signed)
Spoke with pts mother and she is aware of appt. Pt to arrive there at 8:15am. States she may have an issue with the appt time and she wanted the phone number to reschedule it if she needed to, phone number [phone] given to pts mother.

## 2016-06-29 NOTE — Telephone Encounter (Signed)
IR can see him tomorrow am at 8:30am, they just want her to hold feedings for 4 hours prior to the appt. Are you ok with that appt?

## 2016-06-29 NOTE — Telephone Encounter (Signed)
Pts mother states pts G tube is leaking at the site. States it is soaking the gauze and his shirt. She changed the tube last Friday thinking that would help but is hasn't. States he is up to a 24 french now and she doesn't know what to do, Dr. Hilarie Fredrickson please advise.

## 2016-06-29 NOTE — Telephone Encounter (Signed)
Pts mother called back and states that the daycare that [name] is at tried to give his bolus feed slowly over 45 min and is all leaked out. She is very concerned. Please advise. See previous note below.

## 2016-06-29 NOTE — Telephone Encounter (Signed)
Per phone discussion Would ask IR for tube check ASAP If unable to accommodate will likely need ER eval (GI could see him there)

## 2016-06-30 ENCOUNTER — Other Ambulatory Visit (HOSPITAL_COMMUNITY): Payer: Self-pay

## 2016-07-01 ENCOUNTER — Ambulatory Visit (HOSPITAL_COMMUNITY)
Admission: RE | Admit: 2016-07-01 | Discharge: 2016-07-01 | Disposition: A | Discharge status: Home / Self Care | Payer: Medicare Other | Source: Ambulatory Visit | Attending: Internal Medicine | Admitting: Internal Medicine

## 2016-07-01 ENCOUNTER — Encounter (HOSPITAL_COMMUNITY): Payer: Self-pay | Admitting: Interventional Radiology

## 2016-07-01 ENCOUNTER — Other Ambulatory Visit: Payer: Self-pay | Admitting: Internal Medicine

## 2016-07-01 DIAGNOSIS — G809 Cerebral palsy, unspecified: Secondary | ICD-10-CM | POA: Insufficient documentation

## 2016-07-01 DIAGNOSIS — Y733 Surgical instruments, materials and gastroenterology and urology devices (including sutures) associated with adverse incidents: Secondary | ICD-10-CM | POA: Diagnosis not present

## 2016-07-01 DIAGNOSIS — K219 Gastro-esophageal reflux disease without esophagitis: Secondary | ICD-10-CM | POA: Insufficient documentation

## 2016-07-01 DIAGNOSIS — Z431 Encounter for attention to gastrostomy: Secondary | ICD-10-CM | POA: Diagnosis present

## 2016-07-01 DIAGNOSIS — K9423 Gastrostomy malfunction: Secondary | ICD-10-CM

## 2016-07-01 HISTORY — PX: IR GENERIC HISTORICAL: IMG1180011

## 2016-07-01 MED ORDER — IOPAMIDOL (ISOVUE-300) INJECTION 61%
50.0000 mL | Freq: Once | INTRAVENOUS | Status: AC | PRN
  Administered 2016-07-01: 20 mL

## 2016-07-07 ENCOUNTER — Other Ambulatory Visit (HOSPITAL_COMMUNITY): Payer: Self-pay | Admitting: General Surgery

## 2016-07-07 DIAGNOSIS — R633 Feeding difficulties, unspecified: Secondary | ICD-10-CM

## 2016-07-08 ENCOUNTER — Other Ambulatory Visit (HOSPITAL_COMMUNITY): Payer: Self-pay | Admitting: General Surgery

## 2016-07-08 ENCOUNTER — Ambulatory Visit (HOSPITAL_COMMUNITY)
Admission: RE | Admit: 2016-07-08 | Discharge: 2016-07-08 | Disposition: A | Discharge status: Home / Self Care | Payer: Medicare Other | Source: Ambulatory Visit | Attending: General Surgery | Admitting: General Surgery

## 2016-07-08 ENCOUNTER — Encounter (HOSPITAL_COMMUNITY): Payer: Self-pay | Admitting: Radiology

## 2016-07-08 DIAGNOSIS — R633 Feeding difficulties, unspecified: Secondary | ICD-10-CM

## 2016-07-08 HISTORY — PX: IR GENERIC HISTORICAL: IMG1180011

## 2016-07-08 NOTE — Procedures (Signed)
Patient came to IR with a tear in the [place] hub between the G port and the J port.  Tube was repaired with liquidband and the two ports secured with ties.  Family was happy with the plan to repair instead of having to remove and replace the [place].

## 2016-07-14 ENCOUNTER — Encounter (HOSPITAL_COMMUNITY): Payer: Self-pay | Admitting: Interventional Radiology

## 2016-07-14 ENCOUNTER — Other Ambulatory Visit (HOSPITAL_COMMUNITY): Payer: Self-pay | Admitting: Radiology

## 2016-07-14 ENCOUNTER — Ambulatory Visit (HOSPITAL_COMMUNITY)
Admission: RE | Admit: 2016-07-14 | Discharge: 2016-07-14 | Disposition: A | Discharge status: Home / Self Care | Payer: Medicare Other | Source: Ambulatory Visit | Attending: Radiology | Admitting: Radiology

## 2016-07-14 DIAGNOSIS — K9413 Enterostomy malfunction: Secondary | ICD-10-CM | POA: Diagnosis not present

## 2016-07-14 DIAGNOSIS — Y733 Surgical instruments, materials and gastroenterology and urology devices (including sutures) associated with adverse incidents: Secondary | ICD-10-CM | POA: Diagnosis not present

## 2016-07-14 DIAGNOSIS — R633 Feeding difficulties, unspecified: Secondary | ICD-10-CM

## 2016-07-14 HISTORY — PX: IR GENERIC HISTORICAL: IMG1180011

## 2016-07-14 MED ORDER — IOPAMIDOL (ISOVUE-300) INJECTION 61%
INTRAVENOUS | Status: DC | PRN
  Administered 2016-07-14: 15 mL via INTRAVENOUS

## 2016-07-20 ENCOUNTER — Other Ambulatory Visit (HOSPITAL_COMMUNITY): Payer: Self-pay | Admitting: Interventional Radiology

## 2016-07-20 DIAGNOSIS — R633 Feeding difficulties, unspecified: Secondary | ICD-10-CM

## 2016-07-21 ENCOUNTER — Other Ambulatory Visit (HOSPITAL_COMMUNITY): Payer: Self-pay | Admitting: Interventional Radiology

## 2016-07-21 ENCOUNTER — Encounter (HOSPITAL_COMMUNITY): Payer: Self-pay | Admitting: Radiology

## 2016-07-21 ENCOUNTER — Ambulatory Visit (HOSPITAL_COMMUNITY)
Admission: RE | Admit: 2016-07-21 | Discharge: 2016-07-21 | Disposition: A | Discharge status: Home / Self Care | Payer: Medicare Other | Source: Ambulatory Visit | Attending: Interventional Radiology | Admitting: Interventional Radiology

## 2016-07-21 ENCOUNTER — Telehealth: Payer: Self-pay | Admitting: Internal Medicine

## 2016-07-21 DIAGNOSIS — R633 Feeding difficulties, unspecified: Secondary | ICD-10-CM

## 2016-07-21 DIAGNOSIS — Z434 Encounter for attention to other artificial openings of digestive tract: Secondary | ICD-10-CM | POA: Diagnosis not present

## 2016-07-21 HISTORY — PX: IR GENERIC HISTORICAL: IMG1180011

## 2016-07-21 NOTE — Procedures (Signed)
Patient came in with clogged j port of GJ tube placed last week.  I was successful in unclogging the j port with 10 cc flushes using a catheter tip adapter.  I discussed with t he mom that since he had a 64 fr GJ tube we could convert that to a j only tube that would still be placed in the same track but would have a single J port with a much larger inner lumen than the current tube.  I advised her to discuss this with the GI physician to see if that is an option for [facility]. I later found out that this option is only available in a 64 french which is what he had previously.

## 2016-07-21 NOTE — Telephone Encounter (Signed)
Pt's mother aware.

## 2016-07-21 NOTE — Telephone Encounter (Signed)
IR nurse called to clarify that she was able to unclog the J port on his GJ tube. States what they want to do is insert a 24 j tube through the stomach. States they would go the existing hole in his stomach. Please advise is you are ok with this plan.

## 2016-07-21 NOTE — Telephone Encounter (Signed)
Ok with that plan per IR

## 2016-07-21 NOTE — Telephone Encounter (Signed)
Pts mother states that [name]'s G tube is clogged and the IR nurse is supposed to try to put a wire in today at noon to try and unclog the tube. IR nurse wanted to know if anyone had ever talked to them about a J tube being placed in the colon to bypass the stomach. Pts mother wants to know if Dr. Hilarie Fredrickson thinks this would work for [employer], wants to make sure his meds would be absorbed as they should with this tube. Pt has IR appt at noon. Please advise.

## 2016-07-24 ENCOUNTER — Telehealth: Payer: Self-pay | Admitting: Family Medicine

## 2016-07-24 NOTE — Telephone Encounter (Signed)
Caller name: Beth  Relationship to patient: [facility] Can be reached: [phone] Pharmacy:  Reason for call: Needs progress notes from last office visit faxed to [phone]

## 2016-07-24 NOTE — Telephone Encounter (Signed)
Notes faxed as requested.

## 2016-07-27 ENCOUNTER — Other Ambulatory Visit: Payer: Self-pay | Admitting: Physical Medicine & Rehabilitation

## 2016-07-27 DIAGNOSIS — G809 Cerebral palsy, unspecified: Secondary | ICD-10-CM

## 2016-07-27 DIAGNOSIS — G8250 Quadriplegia, unspecified: Secondary | ICD-10-CM

## 2016-07-28 ENCOUNTER — Encounter: Payer: Self-pay | Admitting: Family Medicine

## 2016-07-28 ENCOUNTER — Telehealth: Payer: Self-pay | Admitting: Family Medicine

## 2016-07-28 NOTE — Telephone Encounter (Signed)
Faxed office notes to [facility]

## 2016-07-28 NOTE — Telephone Encounter (Signed)
Caller name: Raquel Sarna  Relation to pt: [facility]  Call back number: [phone]    Reason for call:  Requesting 6 month of office notes reflecting the need of catheter such as urine retention etc supporting why catheter is needed. Please  Fax # [phone]

## 2016-07-30 ENCOUNTER — Other Ambulatory Visit: Payer: Self-pay | Admitting: Family Medicine

## 2016-07-30 DIAGNOSIS — E785 Hyperlipidemia, unspecified: Secondary | ICD-10-CM

## 2016-07-30 DIAGNOSIS — E0590 Thyrotoxicosis, unspecified without thyrotoxic crisis or storm: Secondary | ICD-10-CM

## 2016-07-30 DIAGNOSIS — G40909 Epilepsy, unspecified, not intractable, without status epilepticus: Secondary | ICD-10-CM

## 2016-07-30 DIAGNOSIS — R252 Cramp and spasm: Secondary | ICD-10-CM

## 2016-07-31 DIAGNOSIS — G800 Spastic quadriplegic cerebral palsy: Secondary | ICD-10-CM | POA: Diagnosis not present

## 2016-08-04 ENCOUNTER — Other Ambulatory Visit (INDEPENDENT_AMBULATORY_CARE_PROVIDER_SITE_OTHER): Payer: Medicare Other

## 2016-08-04 DIAGNOSIS — G40909 Epilepsy, unspecified, not intractable, without status epilepticus: Secondary | ICD-10-CM

## 2016-08-04 DIAGNOSIS — E0590 Thyrotoxicosis, unspecified without thyrotoxic crisis or storm: Secondary | ICD-10-CM | POA: Diagnosis not present

## 2016-08-04 DIAGNOSIS — R252 Cramp and spasm: Secondary | ICD-10-CM

## 2016-08-04 DIAGNOSIS — E785 Hyperlipidemia, unspecified: Secondary | ICD-10-CM

## 2016-08-04 LAB — LIPID PANEL
Cholesterol: 118 mg/dL (ref 0–200)
HDL: 38 mg/dL — ABNORMAL LOW (ref >39.00)
LDL Cholesterol: 67 mg/dL (ref 0–99)
NonHDL: 79.96
Total CHOL/HDL Ratio: 3
Triglycerides: 67 mg/dL (ref 0.0–149.0)
VLDL: 13.4 mg/dL (ref 0.0–40.0)

## 2016-08-04 LAB — CBC WITH DIFFERENTIAL/PLATELET
Basophils Absolute: 0 10*3/uL (ref 0.0–0.1)
Basophils Relative: 0.4 % (ref 0.0–3.0)
Eosinophils Absolute: 0 10*3/uL (ref 0.0–0.7)
Eosinophils Relative: 0.6 % (ref 0.0–5.0)
HCT: 43.1 % (ref 39.0–52.0)
Hemoglobin: 14.9 g/dL (ref 13.0–17.0)
Lymphocytes Relative: 26.6 % (ref 12.0–46.0)
Lymphs Abs: 1.6 10*3/uL (ref 0.7–4.0)
MCHC: 34.7 g/dL (ref 30.0–36.0)
MCV: 91 fl (ref 78.0–100.0)
Monocytes Absolute: 0.3 10*3/uL (ref 0.1–1.0)
Monocytes Relative: 4.5 % (ref 3.0–12.0)
Neutro Abs: 4 10*3/uL (ref 1.4–7.7)
Neutrophils Relative %: 67.9 % (ref 43.0–77.0)
Platelets: 211 10*3/uL (ref 150.0–400.0)
RBC: 4.73 Mil/uL (ref 4.22–5.81)
RDW: 13.1 % (ref 11.5–15.5)
WBC: 5.9 10*3/uL (ref 4.0–10.5)

## 2016-08-04 LAB — COMPREHENSIVE METABOLIC PANEL
ALT: 17 U/L (ref 0–53)
AST: 16 U/L (ref 0–37)
Albumin: 4.1 g/dL (ref 3.5–5.2)
Alkaline Phosphatase: 62 U/L (ref 39–117)
BUN: 8 mg/dL (ref 6–23)
CO2: 29 mEq/L (ref 19–32)
Calcium: 9.2 mg/dL (ref 8.4–10.5)
Chloride: 104 mEq/L (ref 96–112)
Creatinine, Ser: 0.34 mg/dL — ABNORMAL LOW (ref 0.40–1.50)
GFR: 318.84 mL/min (ref >60.00)
Glucose, Bld: 83 mg/dL (ref 70–99)
Potassium: 4 mEq/L (ref 3.5–5.1)
Sodium: 140 mEq/L (ref 135–145)
Total Bilirubin: 0.4 mg/dL (ref 0.2–1.2)
Total Protein: 7.4 g/dL (ref 6.0–8.3)

## 2016-08-04 LAB — MAGNESIUM: Magnesium: 2 mg/dL (ref 1.5–2.5)

## 2016-08-04 LAB — TSH: TSH: 1.38 u[IU]/mL (ref 0.35–4.50)

## 2016-08-05 LAB — VALPROIC ACID LEVEL: Valproic Acid Lvl: 54.9 ug/mL (ref 50.0–100.0)

## 2016-08-11 DIAGNOSIS — F339 Major depressive disorder, recurrent, unspecified: Secondary | ICD-10-CM | POA: Diagnosis not present

## 2016-08-27 ENCOUNTER — Encounter: Payer: Self-pay | Admitting: Family Medicine

## 2016-08-27 ENCOUNTER — Ambulatory Visit (INDEPENDENT_AMBULATORY_CARE_PROVIDER_SITE_OTHER): Payer: Medicare Other | Admitting: Family Medicine

## 2016-08-27 VITALS — BP 111/76 | HR 82 | Temp 98.6°F | Ht 60.0 in | Wt 93.0 lb

## 2016-08-27 DIAGNOSIS — Z0000 Encounter for general adult medical examination without abnormal findings: Secondary | ICD-10-CM

## 2016-08-27 DIAGNOSIS — Z23 Encounter for immunization: Secondary | ICD-10-CM | POA: Diagnosis not present

## 2016-08-27 DIAGNOSIS — E785 Hyperlipidemia, unspecified: Secondary | ICD-10-CM

## 2016-08-27 DIAGNOSIS — G800 Spastic quadriplegic cerebral palsy: Secondary | ICD-10-CM | POA: Diagnosis not present

## 2016-08-27 DIAGNOSIS — R1319 Other dysphagia: Secondary | ICD-10-CM

## 2016-08-27 DIAGNOSIS — R1310 Dysphagia, unspecified: Secondary | ICD-10-CM | POA: Diagnosis not present

## 2016-08-27 IMAGING — DX DG CHEST 2V
2 series · 2 of 2 positions shown · non-contrast
Comparison: 01/02/2016 chest radiograph.

CLINICAL DATA: Cough and congestion.

EXAM:
CHEST  2 VIEW

[chest lat]
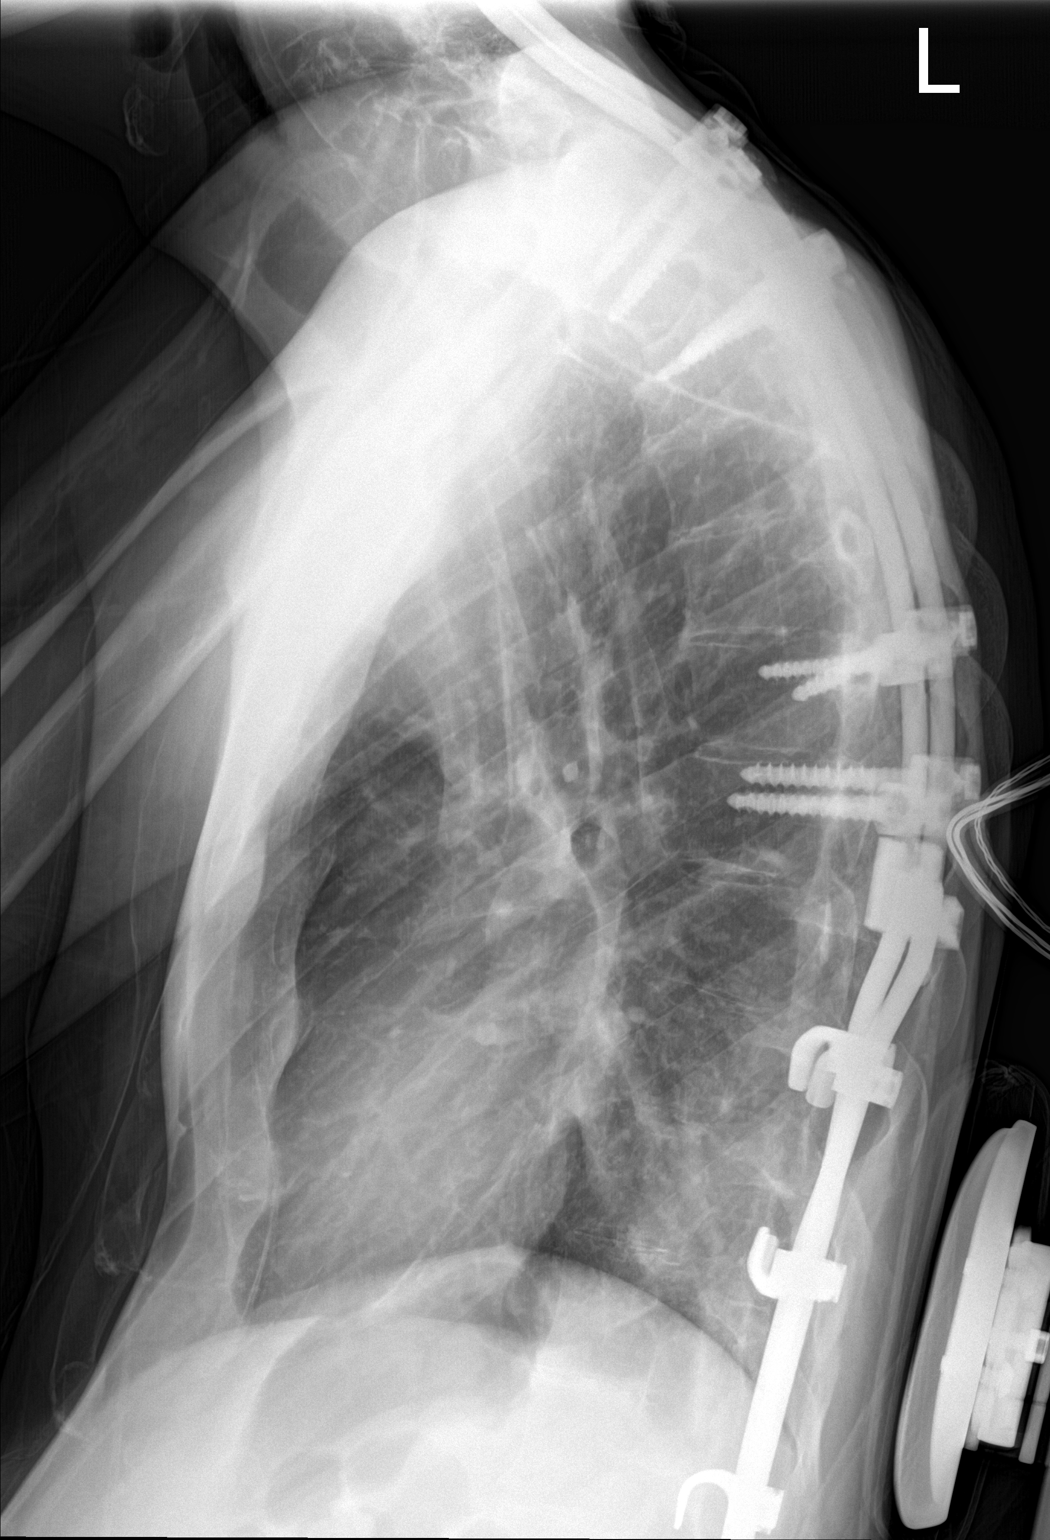

[chest ap]
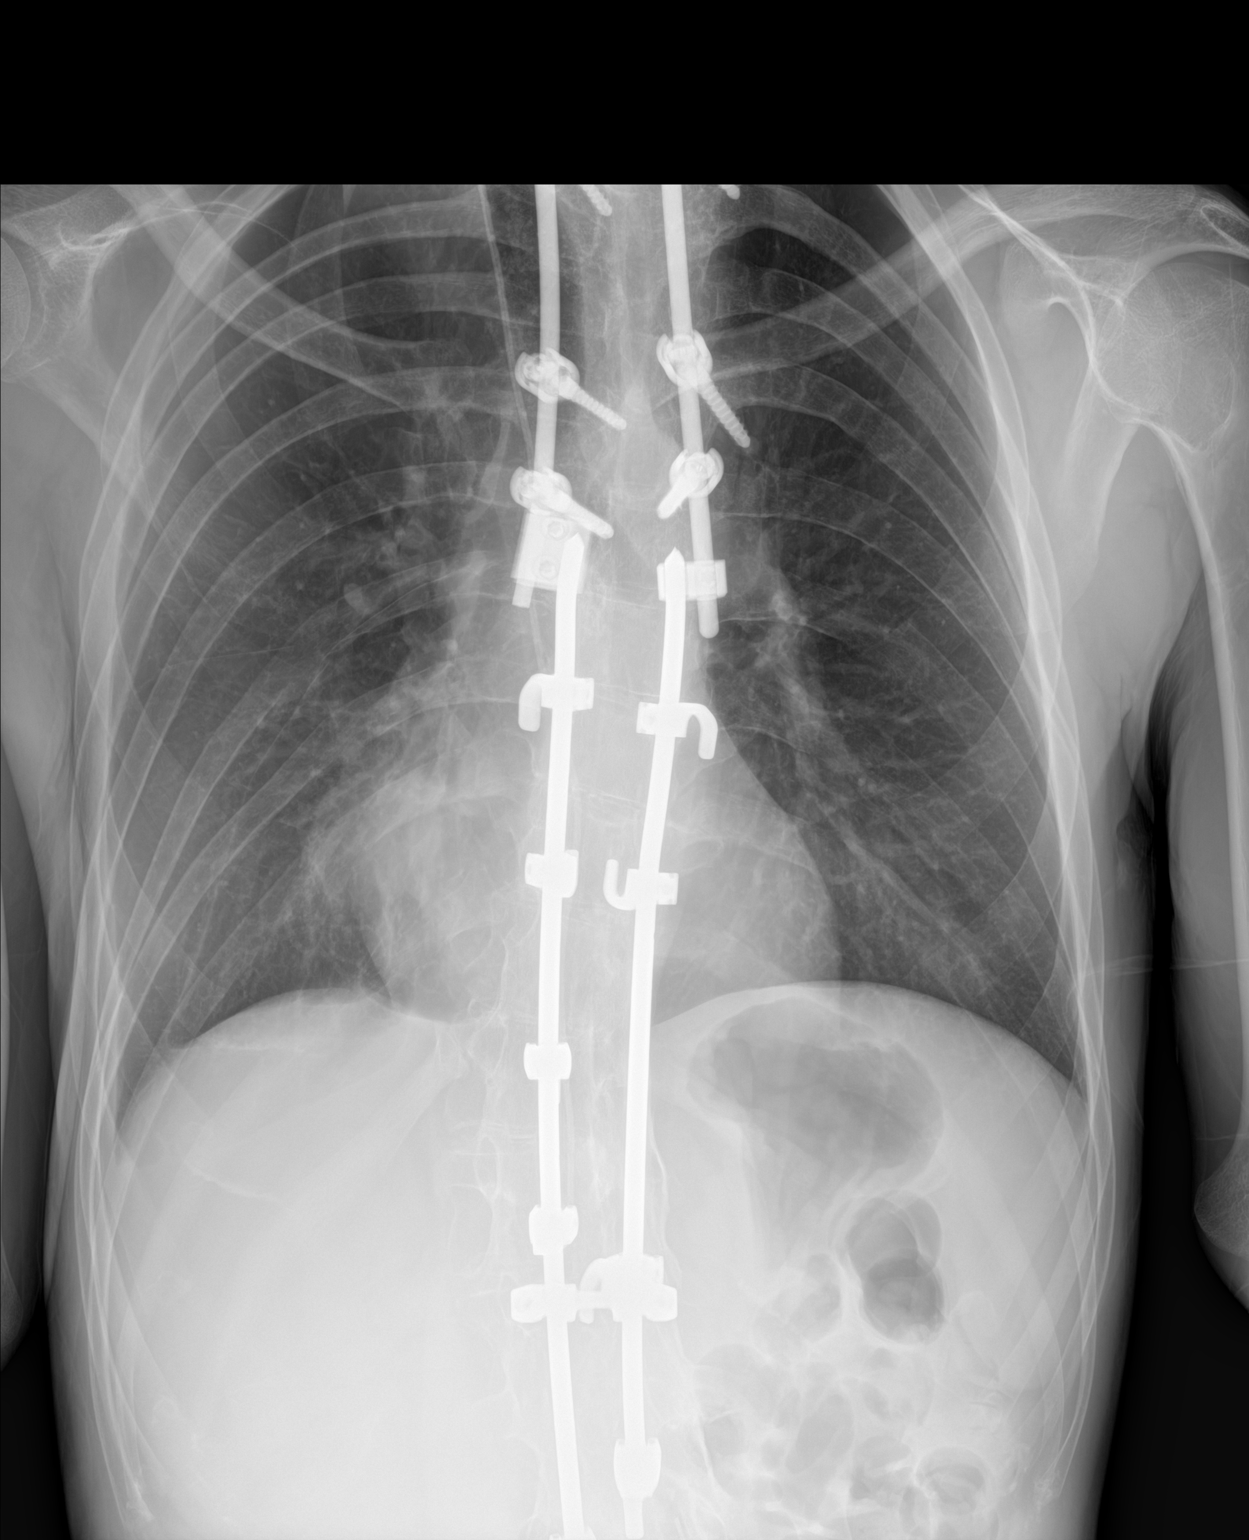

[2 of 2 positions shown; findings below may reference images not displayed]

FINDINGS: Partially visualized bilateral posterior spinal fusion hardware
extending from the upper cervical spine to the T7 level, with
separate bilateral partially visualize posterior spinal fusion
hardware extending from T8 inferiorly into the lumbar spine . No
evidence of hardware fracture or loosening. Stable cardiomediastinal
silhouette with normal heart size. No pneumothorax. No pleural
effusion. No pulmonary edema. There is a focal opacity at the medial
posterior right lung base, not appreciably changed since 01/16/2015
chest CT. No acute consolidative airspace disease. A
vertically-oriented catheter is again noted in the medial right
chest, which correlates with a catheter in the medial right back
subcutaneous soft tissues on the 01/16/2015 chest CT.
IMPRESSION: Chronic focal opacity at the medial posterior right lung base, not
appreciably changed since 01/16/2015 chest CT, where a diagnosis of
rounded atelectasis was suggested. No acute consolidative airspace
disease.

## 2016-08-27 NOTE — Progress Notes (Signed)
Pre visit review using our clinic review tool, if applicable. No additional management support is needed unless otherwise documented below in the visit note. 

## 2016-08-27 NOTE — Patient Instructions (Signed)

## 2016-08-27 NOTE — Assessment & Plan Note (Signed)
Has had worsening cough and gurgling has had G tube changed to GJ tube and then they have changed the formula to Vital 1.5 (3 cans) and Jevity (2 cans) for roughly 1700 kcal

## 2016-08-30 ENCOUNTER — Encounter: Payer: Self-pay | Admitting: Family Medicine

## 2016-08-30 NOTE — Assessment & Plan Note (Signed)
Encouraged heart healthy diet, increase exercise, avoid trans fats, consider a krill oil cap daily 

## 2016-08-30 NOTE — Progress Notes (Signed)
Patient ID: [name], male   DOB: 20-Feb-1986, 30 y.o.   MRN: [id number]   Subjective:    Patient ID: [name], male    DOB: 11/30/1985, 30 y.o.   MRN: [id number]  Chief Complaint  Patient presents with  . Medicare Wellness    HPI Patient is in today for annual medicare wellness exam and follow up on chronic medical concerns. He is brought in today by his mother. He has had trouble with cough and recurrent bronchitis since last visit. No fevers. They are using a combination of Jevity and Vital 1.5 for his feeds and that is helping him have formed BMs, when he was just using Vital 1.5 they had a great deal of trouble with diarrhea but it has improved. Now. They have switched him from a G tube to a GJ tube. No recent hospitalizations. Denies CP/palp/SOB/HA//fevers or GU c/o. Taking meds as prescribed  Past Medical History:  Diagnosis Date  . Cerebral palsy ([place])   . Dehydration 11/22/2013  . Depression with anxiety 08/01/2010   Qualifier: Diagnosis of  By: Nelson-Smith CMA (AAMA), Dottie    . Esophagitis 2011  . 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 GERD (gastroesophageal reflux disease)   . Hyperlipidemia, mild 08/25/2015  . Hyperthyroidism   . Incontinence of feces   . Loss of weight 08/28/2014  . Medicare annual wellness visit, subsequent 08/25/2015  . Palpitations     Past Surgical History:  Procedure Laterality Date  . baclofen trial    . baslofen pump implant    . ears tubes    . EYE SURGERY    . FLEXIBLE SIGMOIDOSCOPY N/A 09/07/2014   Procedure: FLEXIBLE SIGMOIDOSCOPY;  Surgeon: [name], MD;  Location: [facility] ENDOSCOPY;  Service: Endoscopy;  Laterality: N/A;  . g-tube insert  August 2006  . hamstring released     to treat contractures.   Marland Kitchen HIP SURGERY     x2 , side   . IR GENERIC HISTORICAL  07/01/2016   IR GASTR TUBE CONVERT GASTR-JEJ PER W/FL MOD SED 07/01/2016 [name], MD [facility]INTER[facility] RAD  . IR GENERIC HISTORICAL  07/08/2016   IR PATIENT EVAL TECH  0-60 MINS 07/08/2016 [name], MD [facility]  . IR GENERIC HISTORICAL  07/14/2016   IR GJ TUBE CHANGE 07/14/2016 [name], MD [facility]  . IR GENERIC HISTORICAL  07/21/2016   IR PATIENT EVAL TECH 0-60 MINS [facility]  . PEG PLACEMENT  10/21/2011   Procedure: PERCUTANEOUS ENDOSCOPIC GASTROSTOMY (PEG) REPLACEMENT;  Surgeon: [name], MD;  Location: [facility] ENDOSCOPY;  Service: Endoscopy;  Laterality: N/A;  . PEG PLACEMENT N/A 06/13/2013   Procedure: PERCUTANEOUS ENDOSCOPIC GASTROSTOMY (PEG) REPLACEMENT;  Surgeon: [name], MD;  Location: [facility] ENDOSCOPY;  Service: Endoscopy;  Laterality: N/A;  . SPINAL FUSION    . spinal fusion to correct 70 degree kyphosis  11-2010  . spinal fusioncorrect 106 degree kyphosis    . SPINE SURGERY  ,11/20/2010, 2011   for correction of severe contracturing spinal kyphosis.   . TONSILLECTOMY      Family History  Problem Relation Age of Onset  . Asthma Mother   . Hyperlipidemia Mother   . COPD Mother   . Other Mother     bronchial stasis/ABPA  . Cancer Maternal Grandmother 93    breast  . Hyperlipidemia Maternal Grandmother   . Hypertension Maternal Grandmother   . Cancer Maternal Grandfather     prostate  . Heart disease  Paternal Grandfather     CHF  . Osteoporosis Paternal Grandmother   . Arthritis Paternal Grandmother     rheumatoid    Social History   Social History  . Marital status: Single    Spouse name: N/A  . Number of children: 0  . Years of education: N/A   Occupational History  . disbaled    Social History Main Topics  . Smoking status: Never Smoker  . Smokeless tobacco: Never Used  . Alcohol use No  . Drug use: No  . Sexual activity: No   Other Topics Concern  . Not on file   Social History Narrative  . No narrative on file    Outpatient Medications Prior to Visit  Medication Sig Dispense Refill  . AMBULATORY NON FORMULARY MEDICATION Medication Name: MIC gastrostomy/bolus feeding tube 24 French Part  number 0110-24. #2 and 10 cc lurer lock syringe #2 Dx: 4 Device 2  . bacitracin 500 UNIT/GM ointment Apply 1 application topically 2 (two) times daily. 30 g 1  . clotrimazole-betamethasone (LOTRISONE) cream Apply 1 application topically 2 (two) times daily. 45 g 1  . dantrolene (DANTRIUM) 50 MG capsule TAKE 1 CAPSULE BY MOUTH 3 TIMES A DAY. 90 capsule 4  . divalproex (DEPAKOTE SPRINKLE) 125 MG capsule Take 2 capsules in morning, 5 capsules at bedtime (Patient taking differently: 250-625 mg. Take 2 capsules in morning, 5 capsules at bedtime) 210 capsule 4  . fluticasone (FLONASE) 50 MCG/ACT nasal spray Place 2 sprays into both nostrils daily. 16 g 1  . Incontinence Supply Disposable (PREVAIL BREEZERS MEDIUM) MISC pkg of 16- size medium 32" to 44"  Breathable cloth-like outer fabric (can't use the plastic outer surgace  Item # [id number]-012/[id number] 16 each 6  . LORazepam (ATIVAN) 1 MG tablet Take one tablet in evenings as needed for insomnia. 30 tablet 1  . NON FORMULARY Bard Leg Bag Extension tubing w/Connector 18", Sterile, latex-free  Item# [id number]    . NON FORMULARY Colorplast Freedom Cath Latex Self-Adhering Male External Catheter 24mm Diameter Intermediate  Item# [id number]    . NONFORMULARY OR COMPOUNDED ITEM Covidien REF [id number] - Kangaroo Joey Pump Set with Flush Bags - 1000 mL 1 each 0  . Nutritional Supplements (FEEDING SUPPLEMENT, JEVITY 1.5 CAL,) LIQD Run tube feed at 70 mL per hour over 17 hours. 35550 mL 11  . nystatin cream (MYCOSTATIN) Apply 1 application topically 2 (two) times daily as needed for dry skin. 30 g 1  . OLANZapine (ZYPREXA) 2.5 MG tablet Place 1 tablet into feeding tube 3 (three) times daily.  3  . Omeprazole-Sodium Bicarbonate (ZEGERID) 20-1100 MG CAPS capsule Take 1 capsule by mouth daily before breakfast.    . Ostomy Supplies (PROTECTIVE BARRIER WIPES) MISC 1-1/4" X 3"  Item ID:[id number] 75 each 6  . PARoxetine (PAXIL) 10 MG tablet Take 15 mg by mouth daily.     No  facility-administered medications prior to visit.     Allergies  Allergen Reactions  . Ambien [Zolpidem Tartrate] Nausea Only  . Antihistamines, Chlorpheniramine-Type     Other reaction(s): Other (See Comments) Other Reaction: agitation  . Codeine Other (See Comments)    Makes patient too active after a few days.  . Metoclopramide Other (See Comments)    Delusion, emotionality   . Baclofen Anxiety  . Sulfonamide Derivatives Rash    Review of Systems  Constitutional: Positive for malaise/fatigue. Negative for chills and fever.  HENT: Negative for congestion and hearing loss.   Eyes: Negative  for discharge.  Respiratory: Negative for cough, sputum production and shortness of breath.   Cardiovascular: Negative for chest pain, palpitations and leg swelling.  Gastrointestinal: Positive for abdominal pain and heartburn. Negative for blood in stool, constipation, diarrhea, nausea and vomiting.  Genitourinary: Negative for dysuria, frequency, hematuria and urgency.  Musculoskeletal: Positive for myalgias. Negative for back pain and falls.  Skin: Negative for rash.  Neurological: Negative for dizziness, sensory change, loss of consciousness, weakness and headaches.  Endo/Heme/Allergies: Negative for environmental allergies. Does not bruise/bleed easily.  Psychiatric/Behavioral: Negative for depression and suicidal ideas. The patient is not nervous/anxious and does not have insomnia.        Objective:    Physical Exam  Constitutional: He is oriented to person, place, and time. He appears well-developed and well-nourished. No distress.  HENT:  Head: Normocephalic and atraumatic.  Eyes: Conjunctivae are normal.  Neck: Neck supple. No thyromegaly present.  Cardiovascular: Normal rate, regular rhythm and normal heart sounds.   No murmur heard. Pulmonary/Chest: Effort normal and breath sounds normal. No respiratory distress. He has no wheezes.  Abdominal: Soft. Bowel sounds are normal.  He exhibits no mass. There is no tenderness.  PEG tube in LUQ, no surrounding fluctuance or erythema  Musculoskeletal: He exhibits no edema.  Severe spasticity in all extremities. In wheelchair  Lymphadenopathy:    He has no cervical adenopathy.  Neurological: He is alert and oriented to person, place, and time.  Skin: Skin is warm and dry.  Psychiatric: He has a normal mood and affect. His behavior is normal.    BP 111/76 (BP Location: Left Arm, Patient Position: Sitting, Cuff Size: Normal)   Pulse 82   Temp 98.6 F (37 C) (Axillary)   Ht 5' (1.524 m)   Wt 93 lb (42.2 kg)   SpO2 97%   BMI 18.16 kg/m  Wt Readings from Last 3 Encounters:  08/27/16 93 lb (42.2 kg)  06/04/16 93 lb (42.2 kg)  05/12/16 94 lb (42.6 kg)     Lab Results  Component Value Date   WBC 5.9 08/04/2016   HGB 14.9 08/04/2016   HCT 43.1 08/04/2016   PLT 211.0 08/04/2016   GLUCOSE 83 08/04/2016   CHOL 118 08/04/2016   TRIG 67.0 08/04/2016   HDL 38.00 (L) 08/04/2016   LDLCALC 67 08/04/2016   ALT 17 08/04/2016   AST 16 08/04/2016   NA 140 08/04/2016   K 4.0 08/04/2016   CL 104 08/04/2016   CREATININE 0.34 (L) 08/04/2016   BUN 8 08/04/2016   CO2 29 08/04/2016   TSH 1.38 08/04/2016   HGBA1C 5.0 04/29/2007    Lab Results  Component Value Date   TSH 1.38 08/04/2016   Lab Results  Component Value Date   WBC 5.9 08/04/2016   HGB 14.9 08/04/2016   HCT 43.1 08/04/2016   MCV 91.0 08/04/2016   PLT 211.0 08/04/2016   Lab Results  Component Value Date   NA 140 08/04/2016   K 4.0 08/04/2016   CO2 29 08/04/2016   GLUCOSE 83 08/04/2016   BUN 8 08/04/2016   CREATININE 0.34 (L) 08/04/2016   BILITOT 0.4 08/04/2016   ALKPHOS 62 08/04/2016   AST 16 08/04/2016   ALT 17 08/04/2016   PROT 7.4 08/04/2016   ALBUMIN 4.1 08/04/2016   CALCIUM 9.2 08/04/2016   ANIONGAP 11 09/18/2014   GFR 318.84 08/04/2016   Lab Results  Component Value Date   CHOL 118 08/04/2016   Lab Results  Component Value  Date   HDL 38.00 (L) 08/04/2016   Lab Results  Component Value Date   LDLCALC 67 08/04/2016   Lab Results  Component Value Date   TRIG 67.0 08/04/2016   Lab Results  Component Value Date   CHOLHDL 3 08/04/2016   Lab Results  Component Value Date   HGBA1C 5.0 04/29/2007       Assessment & Plan:   Problem List Items Addressed This Visit    Esophageal dysphagia (Chronic)    Is exclusive taking PEG tube feedings and takes a mix of Jevity and Vital 1.5      Medicare annual wellness visit, subsequent    Patient denies any difficulties at home. No trouble with ADLs, depression or falls. See EMR for functional status screen and depression screen. No recent changes to vision or hearing. Is UTD with immunizations. Is UTD with screening. Discussed Advanced Directives. Encouraged heart healthy diet, exercise as tolerated and adequate sleep. See patient's problem list for health risk factors to monitor. See AVS for preventative healthcare recommendation schedule.      Hyperlipidemia, mild    Encouraged heart healthy diet, increase exercise, avoid trans fats, consider a krill oil cap daily      Cerebral palsy ([facility])    Has had worsening cough and gurgling has had G tube changed to [place] tube and then they have changed the formula to Vital 1.5 (3 cans) and Jevity (2 cans) for roughly 1700 kcal       Other Visit Diagnoses    Need for vaccination with 13-polyvalent pneumococcal conjugate vaccine    -  Primary   Relevant Orders   Pneumococcal conjugate vaccine 13-valent (Completed)   Encounter for immunization       Relevant Orders   Flu Vaccine QUAD 36+ mos IM (Completed)      I am having Mr. Basara maintain his nystatin cream, divalproex, clotrimazole-betamethasone, Omeprazole-Sodium Bicarbonate, NON FORMULARY, NON FORMULARY, Protective Barrier Wipes, PREVAIL BREEZERS MEDIUM, bacitracin, NONFORMULARY OR COMPOUNDED ITEM, LORazepam, feeding supplement (JEVITY 1.5 CAL), dantrolene,  PARoxetine, AMBULATORY NON FORMULARY MEDICATION, fluticasone, and OLANZapine.  No orders of the defined types were placed in this encounter.    [name], MD

## 2016-08-30 NOTE — Assessment & Plan Note (Signed)
Is exclusive taking PEG tube feedings and takes a mix of Jevity and Vital 1.5

## 2016-08-30 NOTE — Assessment & Plan Note (Signed)
Patient denies any difficulties at home. No trouble with ADLs, depression or falls. See EMR for functional status screen and depression screen. No recent changes to vision or hearing. Is UTD with immunizations. Is UTD with screening. Discussed Advanced Directives. Encouraged heart healthy diet, exercise as tolerated and adequate sleep. See patient's problem list for health risk factors to monitor. See AVS for preventative healthcare recommendation schedule. 

## 2016-08-31 ENCOUNTER — Ambulatory Visit: Payer: Self-pay | Admitting: Medical

## 2016-08-31 ENCOUNTER — Encounter (HOSPITAL_COMMUNITY): Payer: Self-pay | Admitting: Interventional Radiology

## 2016-08-31 ENCOUNTER — Ambulatory Visit (HOSPITAL_COMMUNITY)
Admission: RE | Admit: 2016-08-31 | Discharge: 2016-08-31 | Disposition: A | Discharge status: Home / Self Care | Payer: Medicare Other | Source: Ambulatory Visit | Attending: Interventional Radiology | Admitting: Interventional Radiology

## 2016-08-31 ENCOUNTER — Other Ambulatory Visit (HOSPITAL_COMMUNITY): Payer: Self-pay | Admitting: Interventional Radiology

## 2016-08-31 DIAGNOSIS — E785 Hyperlipidemia, unspecified: Secondary | ICD-10-CM | POA: Diagnosis not present

## 2016-08-31 DIAGNOSIS — G809 Cerebral palsy, unspecified: Secondary | ICD-10-CM | POA: Insufficient documentation

## 2016-08-31 DIAGNOSIS — Z8261 Family history of arthritis: Secondary | ICD-10-CM | POA: Insufficient documentation

## 2016-08-31 DIAGNOSIS — Z431 Encounter for attention to gastrostomy: Secondary | ICD-10-CM | POA: Insufficient documentation

## 2016-08-31 DIAGNOSIS — Z888 Allergy status to other drugs, medicaments and biological substances status: Secondary | ICD-10-CM | POA: Diagnosis not present

## 2016-08-31 DIAGNOSIS — Z885 Allergy status to narcotic agent status: Secondary | ICD-10-CM | POA: Insufficient documentation

## 2016-08-31 DIAGNOSIS — Z803 Family history of malignant neoplasm of breast: Secondary | ICD-10-CM | POA: Insufficient documentation

## 2016-08-31 DIAGNOSIS — R633 Feeding difficulties, unspecified: Secondary | ICD-10-CM

## 2016-08-31 DIAGNOSIS — Z882 Allergy status to sulfonamides status: Secondary | ICD-10-CM | POA: Insufficient documentation

## 2016-08-31 DIAGNOSIS — K210 Gastro-esophageal reflux disease with esophagitis: Secondary | ICD-10-CM | POA: Insufficient documentation

## 2016-08-31 DIAGNOSIS — Z881 Allergy status to other antibiotic agents status: Secondary | ICD-10-CM | POA: Diagnosis not present

## 2016-08-31 DIAGNOSIS — Z9889 Other specified postprocedural states: Secondary | ICD-10-CM | POA: Diagnosis not present

## 2016-08-31 DIAGNOSIS — Z8042 Family history of malignant neoplasm of prostate: Secondary | ICD-10-CM | POA: Insufficient documentation

## 2016-08-31 DIAGNOSIS — Z8249 Family history of ischemic heart disease and other diseases of the circulatory system: Secondary | ICD-10-CM | POA: Insufficient documentation

## 2016-08-31 DIAGNOSIS — R002 Palpitations: Secondary | ICD-10-CM | POA: Diagnosis not present

## 2016-08-31 DIAGNOSIS — K9423 Gastrostomy malfunction: Secondary | ICD-10-CM | POA: Diagnosis not present

## 2016-08-31 HISTORY — PX: IR GENERIC HISTORICAL: IMG1180011

## 2016-08-31 MED ORDER — IOPAMIDOL (ISOVUE-300) INJECTION 61%
50.0000 mL | Freq: Once | INTRAVENOUS | Status: AC | PRN
  Administered 2016-08-31: 10 mL via INTRAVENOUS

## 2016-08-31 NOTE — Procedures (Signed)
Occluded GJ tube  S/p fluoro 75fr GJ tube exchg Tip prox jejunum No comp Stable Ready for use Full report in PACS

## 2016-09-01 ENCOUNTER — Other Ambulatory Visit: Payer: Self-pay | Admitting: Family Medicine

## 2016-09-01 MED ORDER — DOXYCYCLINE HYCLATE 100 MG PO CAPS: 100.0000 mg | ORAL_CAPSULE | Freq: Two times a day (BID) | ORAL | 0 refills | Status: DC

## 2016-09-02 ENCOUNTER — Ambulatory Visit: Payer: Self-pay | Admitting: Medical

## 2016-09-02 ENCOUNTER — Ambulatory Visit (INDEPENDENT_AMBULATORY_CARE_PROVIDER_SITE_OTHER): Payer: Medicare Other | Admitting: Medical

## 2016-09-02 ENCOUNTER — Ambulatory Visit (HOSPITAL_BASED_OUTPATIENT_CLINIC_OR_DEPARTMENT_OTHER)
Admission: RE | Admit: 2016-09-02 | Discharge: 2016-09-02 | Disposition: A | Discharge status: Home / Self Care | Payer: Medicare Other | Source: Ambulatory Visit | Attending: Medical | Admitting: Medical

## 2016-09-02 ENCOUNTER — Encounter: Payer: Self-pay | Admitting: Medical

## 2016-09-02 VITALS — HR 78 | Temp 97.0°F | Ht 60.0 in | Wt 93.0 lb

## 2016-09-02 DIAGNOSIS — R918 Other nonspecific abnormal finding of lung field: Secondary | ICD-10-CM | POA: Diagnosis not present

## 2016-09-02 DIAGNOSIS — L089 Local infection of the skin and subcutaneous tissue, unspecified: Secondary | ICD-10-CM | POA: Diagnosis not present

## 2016-09-02 DIAGNOSIS — R0989 Other specified symptoms and signs involving the circulatory and respiratory systems: Secondary | ICD-10-CM | POA: Insufficient documentation

## 2016-09-02 DIAGNOSIS — R05 Cough: Secondary | ICD-10-CM

## 2016-09-02 DIAGNOSIS — R059 Cough, unspecified: Secondary | ICD-10-CM

## 2016-09-02 DIAGNOSIS — G800 Spastic quadriplegic cerebral palsy: Secondary | ICD-10-CM | POA: Diagnosis not present

## 2016-09-02 LAB — CBC WITH DIFFERENTIAL/PLATELET
Basophils Absolute: 0 10*3/uL (ref 0.0–0.1)
Basophils Relative: 0.2 % (ref 0.0–3.0)
Eosinophils Absolute: 0.1 10*3/uL (ref 0.0–0.7)
Eosinophils Relative: 0.9 % (ref 0.0–5.0)
HCT: 42.5 % (ref 39.0–52.0)
Hemoglobin: 14.6 g/dL (ref 13.0–17.0)
Lymphocytes Relative: 28.9 % (ref 12.0–46.0)
Lymphs Abs: 1.8 10*3/uL (ref 0.7–4.0)
MCHC: 34.3 g/dL (ref 30.0–36.0)
MCV: 91.3 fl (ref 78.0–100.0)
Monocytes Absolute: 0.3 10*3/uL (ref 0.1–1.0)
Monocytes Relative: 4.3 % (ref 3.0–12.0)
Neutro Abs: 4 10*3/uL (ref 1.4–7.7)
Neutrophils Relative %: 65.7 % (ref 43.0–77.0)
Platelets: 219 10*3/uL (ref 150.0–400.0)
RBC: 4.66 Mil/uL (ref 4.22–5.81)
RDW: 12.2 % (ref 11.5–15.5)
WBC: 6.2 10*3/uL (ref 4.0–10.5)

## 2016-09-02 MED ORDER — MUPIROCIN 2 % EX OINT: TOPICAL_OINTMENT | CUTANEOUS | 0 refills | Status: DC

## 2016-09-02 NOTE — Patient Instructions (Addendum)
For skin infection/ possible infected sebaceous cyst or abscess(that drained) continue the doxycycline antibiotic. Since areas has been problematic for 6- 7 months will refer to ENT. If re-swells again prior to appointment with ENT then please return to our office.  For cough and chest congestion will get cbc and cxr.  Follow up in 2-3 weeks or as needed.

## 2016-09-02 NOTE — Progress Notes (Signed)
Pre visit review using our clinic review tool, if applicable. No additional management support is needed unless otherwise documented below in the visit note. 

## 2016-09-02 NOTE — Progress Notes (Signed)
Subjective:    Patient ID: [name], male    DOB: 09-30-1986, 30 y.o.   MRN: [id number]  HPI  Pt has area behind his rt ear that has been swollen on and off for some time. This area has been re-occurring variou times in past 6-7 months. Most recently this area flared up and rupture(over past 7-10 days). Pt caregiver states expresses some blood from area yesterday. Now the area has flat appearance.  Pt caretaker contacted Dr. Charlett Blake  and doxycycline was sent  to his pharmacy(med started just recently per caretaker). He is on med presently. Per caretaker in the  past the area would burst and bleed like yesterday.  Also some recent chest congestion and gurgling over last 24-48 hous. Per caretaker. No fever, no chills or sweats. No obvious wheezing.     Review of Systems  Constitutional: Negative for chills, fatigue and fever.  HENT: Positive for ear pain. Negative for congestion, sinus pressure and sore throat.        Rt ear region pain with swelling recently.  Respiratory: Positive for cough. Negative for chest tightness, shortness of breath and wheezing.        Occasional.  Cardiovascular: Negative for chest pain and palpitations.  Gastrointestinal: Negative for abdominal pain.  Musculoskeletal: Negative for neck stiffness.  Neurological: Negative for dizziness and headaches.  Hematological: Negative for adenopathy. Does not bruise/bleed easily.  Psychiatric/Behavioral: Negative for behavioral problems and confusion.       No apparent.    Past Medical History:  Diagnosis Date  . Cerebral palsy ([place])   . Dehydration 11/22/2013  . Depression with anxiety 08/01/2010   Qualifier: Diagnosis of  By: Nelson-Smith CMA (AAMA), Dottie    . Esophagitis 2011  . Gastrostomy in place [facility]) 08/31/2013  . GERD (gastroesophageal reflux disease)   . Hyperlipidemia, mild 08/25/2015  . Hyperthyroidism   . Incontinence of feces   . Loss of weight 08/28/2014  . Medicare annual wellness visit,  subsequent 08/25/2015  . Palpitations      Social History   Social History  . Marital status: Single    Spouse name: N/A  . Number of children: 0  . Years of education: N/A   Occupational History  . disbaled    Social History Main Topics  . Smoking status: Never Smoker  . Smokeless tobacco: Never Used  . Alcohol use No  . Drug use: No  . Sexual activity: No   Other Topics Concern  . Not on file   Social History Narrative  . No narrative on file    Past Surgical History:  Procedure Laterality Date  . baclofen trial    . baslofen pump implant    . ears tubes    . EYE SURGERY    . FLEXIBLE SIGMOIDOSCOPY N/A 09/07/2014   Procedure: FLEXIBLE SIGMOIDOSCOPY;  Surgeon: [name], MD;  Location[facility] ENDOSCOPY;  Service: Endoscopy;  Laterality: N/A;  . g-tube insert  August 2006  . hamstring released     to treat contractures.   Marland Kitchen HIP SURGERY     x2 , side   . IR GENERIC HISTORICAL  07/01/2016   IR GASTR TUBE CONVERT GASTR-JEJ PER W/FL MOD SED 07/01/2016 [name], MD [facility]-INTERV RAD  . IR GENERIC HISTORICAL  07/08/2016   IR PATIENT EVAL TECH 0-60 MINS 07/08/2016 [name], MD [facility]  . IR GENERIC HISTORICAL  07/14/2016   IR GJ TUBE CHANGE 07/14/2016 [name], MD [facility]  .  IR GENERIC HISTORICAL  07/21/2016   IR PATIENT EVAL TECH 0-60 MINS [facility]  . IR GENERIC HISTORICAL  08/31/2016   IR [place] DUODEN/JEJUNO TUBE PERCUT W/FLUORO 08/31/2016 [name], MD [facility]INTER[facility]  . PEG PLACEMENT  10/21/2011   Procedure: PERCUTANEOUS ENDOSCOPIC GASTROSTOMY (PEG) REPLACEMENT;  Surgeon: [name], MD;  Location: [facility] ENDOSCOPY;  Service: Endoscopy;  Laterality: N/A;  . PEG PLACEMENT N/A 06/13/2013   Procedure: PERCUTANEOUS ENDOSCOPIC GASTROSTOMY (PEG) REPLACEMENT;  Surgeon: [name], MD;  Location: [facility] ENDOSCOPY;  Service: Endoscopy;  Laterality: N/A;  . SPINAL FUSION    . spinal fusion to correct 70 degree kyphosis  11-2010  . spinal fusioncorrect 106  degree kyphosis    . SPINE SURGERY  ,11/20/2010, 2011   for correction of severe contracturing spinal kyphosis.   . TONSILLECTOMY      Family History  Problem Relation Age of Onset  . Asthma Mother   . Hyperlipidemia Mother   . COPD Mother   . Other Mother     bronchial stasis/ABPA  . Cancer Maternal Grandmother 22    breast  . Hyperlipidemia Maternal Grandmother   . Hypertension Maternal Grandmother   . Cancer Maternal Grandfather     prostate  . Heart disease Paternal Grandfather     CHF  . Osteoporosis Paternal Grandmother   . Arthritis Paternal Grandmother     rheumatoid    Allergies  Allergen Reactions  . Ambien [Zolpidem Tartrate] Nausea Only  . Antihistamines, Chlorpheniramine-Type     Other reaction(s): Other (See Comments) Other Reaction: agitation  . Codeine Other (See Comments)    Makes patient too active after a few days.  . Metoclopramide Other (See Comments)    Delusion, emotionality   . Baclofen Anxiety  . Sulfonamide Derivatives Rash    Current Outpatient Prescriptions on File Prior to Visit  Medication Sig Dispense Refill  . AMBULATORY NON FORMULARY MEDICATION Medication Name: MIC gastrostomy/bolus feeding tube 24 French Part number 0110-24. #2 and 10 cc lurer lock syringe #2 Dx: 4 Device 2  . bacitracin 500 UNIT/GM ointment Apply 1 application topically 2 (two) times daily. 30 g 1  . clotrimazole-betamethasone (LOTRISONE) cream Apply 1 application topically 2 (two) times daily. 45 g 1  . dantrolene (DANTRIUM) 50 MG capsule TAKE 1 CAPSULE BY MOUTH 3 TIMES A DAY. 90 capsule 4  . divalproex (DEPAKOTE SPRINKLE) 125 MG capsule Take 2 capsules in morning, 5 capsules at bedtime (Patient taking differently: 250-625 mg. Take 2 capsules in morning, 5 capsules at bedtime) 210 capsule 4  . doxycycline (VIBRAMYCIN) 100 MG capsule Take 1 capsule (100 mg total) by mouth 2 (two) times daily. Take for 10 days 20 capsule 0  . fluticasone (FLONASE) 50 MCG/ACT nasal  spray Place 2 sprays into both nostrils daily. 16 g 1  . Incontinence Supply Disposable (PREVAIL BREEZERS MEDIUM) MISC pkg of 16- size medium 32" to 44"  Breathable cloth-like outer fabric (can't use the plastic outer surgace  Item # [id number] 16 each 6  . LORazepam (ATIVAN) 1 MG tablet Take one tablet in evenings as needed for insomnia. 30 tablet 1  . NON FORMULARY Bard Leg Bag Extension tubing w/Connector 18", Sterile, latex-free  Item# [id number]    . NON FORMULARY Colorplast Freedom Cath Latex Self-Adhering Male External Catheter 21mm Diameter Intermediate  Item# [id number]    . NONFORMULARY OR COMPOUNDED Suzy Bouchard REF [id number] - Kangaroo Joey Pump Set with Flush Bags - 1000 mL 1 each 0  .  Nutritional Supplements (FEEDING SUPPLEMENT, JEVITY 1.5 CAL,) LIQD Run tube feed at 70 mL per hour over 17 hours. 35550 mL 11  . nystatin cream (MYCOSTATIN) Apply 1 application topically 2 (two) times daily as needed for dry skin. 30 g 1  . OLANZapine (ZYPREXA) 2.5 MG tablet Place 1 tablet into feeding tube 3 (three) times daily.  3  . Omeprazole-Sodium Bicarbonate (ZEGERID) 20-1100 MG CAPS capsule Take 1 capsule by mouth daily before breakfast.    . Ostomy Supplies (PROTECTIVE BARRIER WIPES) MISC 1-1/4" X 3"  Item ID:[id number] 75 each 6  . PARoxetine (PAXIL) 10 MG tablet Take 15 mg by mouth daily.     No current facility-administered medications on file prior to visit.     Pulse 78   Temp 97 F (36.1 C) (Oral)   Ht 5' (1.524 m)   Wt 93 lb (42.2 kg)   SpO2 96%   BMI 18.16 kg/m       Objective:   Physical Exam  Mental Status - Alert. General Appearance - Well groomed. Not in acute distress.(pt appears pleasant. Good mood. In wheel chair. Hx of cp.  Skin Rashes- No Rashes.  HEENT Head- Normal. Ear Auditory Canal - Left- Normal. Right - Normal.Tympanic Membrane- Left- Normal. Right- Normal. Behind rt ear just above lobe area. 1 cm hyperpgmented area with faint  Tiny broken down area in  center where discharge was expressed.(area is flat, not fluctuant. No warmth or tenderness. No dc presently) Rt ear looks normal.  Eye Sclera/Conjunctiva- Left- Normal. Right- Normal. Nose & Sinuses Nasal Mucosa- Left-  Not boggy or Congested. Right-  Not  boggy or Congested. Mouth & Throat Lips: Upper Lip- Normal: no dryness, cracking, pallor, cyanosis, or vesicular eruption. Lower Lip-Normal: no dryness, cracking, pallor, cyanosis or vesicular eruption. Buccal Mucosa- Bilateral- No Aphthous ulcers. Oropharynx- No Discharge or Erythema. Tonsils: Characteristics- Bilateral- No Erythema or Congestion. Size/Enlargement- Bilateral- No enlargement. Discharge- bilateral-None.  Neck Neck- Supple. No Masses.   Chest and Lung Exam Auscultation: Breath Sounds:- even and unlabored, but  Very faint  bilateral upper lobe rhonchi. No wheezing.  Cardiovascular Auscultation:Rythm- Regular, rate and rhythm. Murmurs & Other Heart Sounds:Ausculatation of the heart reveal- No Murmurs.  Lymphatic Head & Neck General Head & Neck Lymphatics: Bilateral: Description- No Localized lymphadenopathy.     Assessment & Plan:  For skin infection/ possible infected sebaceous cyst or abscess(that drained) continue the doxycycline antibiotic. Since areas has been problematic for 6-7 months will refer to ENT. If re-swells again prior to appointment with ENT then please return to our office.  For cough and chest congestion will get cbc and cxr.  Follow up in 2-3 weeks or as needed.   Note if cxr positive for pneumonia then would need sooner follow up.  Rx mupirocin written could use in future early on if skin area behind ear becomes inflamed.

## 2016-09-03 ENCOUNTER — Other Ambulatory Visit (HOSPITAL_COMMUNITY): Payer: Self-pay | Admitting: Interventional Radiology

## 2016-09-03 ENCOUNTER — Ambulatory Visit (HOSPITAL_COMMUNITY)
Admission: RE | Admit: 2016-09-03 | Discharge: 2016-09-03 | Disposition: A | Discharge status: Home / Self Care | Payer: Medicare Other | Source: Ambulatory Visit | Attending: Interventional Radiology | Admitting: Interventional Radiology

## 2016-09-03 ENCOUNTER — Encounter (HOSPITAL_COMMUNITY): Payer: Self-pay | Admitting: Interventional Radiology

## 2016-09-03 DIAGNOSIS — K9423 Gastrostomy malfunction: Secondary | ICD-10-CM | POA: Diagnosis not present

## 2016-09-03 DIAGNOSIS — Z434 Encounter for attention to other artificial openings of digestive tract: Secondary | ICD-10-CM | POA: Diagnosis not present

## 2016-09-03 DIAGNOSIS — R633 Feeding difficulties, unspecified: Secondary | ICD-10-CM

## 2016-09-03 HISTORY — PX: IR GENERIC HISTORICAL: IMG1180011

## 2016-09-03 MED ORDER — IOPAMIDOL (ISOVUE-300) INJECTION 61%
INTRAVENOUS | Status: AC
  Administered 2016-09-03: 25 mL
  Filled 2016-09-03: qty 50

## 2016-09-03 NOTE — Procedures (Signed)
Successful fluoroscopic guided replacement of GJ tube.  No immediate post procedural complications.  The feeding tube is ready for immediate use.  Ronny Bacon, MD Pager #: [phone]

## 2016-09-04 ENCOUNTER — Other Ambulatory Visit (HOSPITAL_COMMUNITY): Payer: Self-pay | Admitting: Interventional Radiology

## 2016-09-04 ENCOUNTER — Encounter (HOSPITAL_COMMUNITY): Payer: Self-pay

## 2016-09-04 DIAGNOSIS — R633 Feeding difficulties, unspecified: Secondary | ICD-10-CM

## 2016-09-10 ENCOUNTER — Encounter (HOSPITAL_COMMUNITY): Payer: Self-pay | Admitting: Interventional Radiology

## 2016-09-10 ENCOUNTER — Other Ambulatory Visit (HOSPITAL_COMMUNITY): Payer: Self-pay | Admitting: Interventional Radiology

## 2016-09-10 ENCOUNTER — Ambulatory Visit (HOSPITAL_COMMUNITY)
Admission: RE | Admit: 2016-09-10 | Discharge: 2016-09-10 | Disposition: A | Discharge status: Home / Self Care | Payer: Medicare Other | Source: Ambulatory Visit | Attending: Interventional Radiology | Admitting: Interventional Radiology

## 2016-09-10 DIAGNOSIS — K9423 Gastrostomy malfunction: Secondary | ICD-10-CM | POA: Diagnosis not present

## 2016-09-10 DIAGNOSIS — R633 Feeding difficulties, unspecified: Secondary | ICD-10-CM

## 2016-09-10 DIAGNOSIS — Y733 Surgical instruments, materials and gastroenterology and urology devices (including sutures) associated with adverse incidents: Secondary | ICD-10-CM | POA: Diagnosis not present

## 2016-09-10 HISTORY — PX: IR GENERIC HISTORICAL: IMG1180011

## 2016-09-10 MED ORDER — IOPAMIDOL (ISOVUE-300) INJECTION 61%
50.0000 mL | Freq: Once | INTRAVENOUS | Status: AC | PRN
  Administered 2016-09-10: 15 mL via INTRAVENOUS

## 2016-09-10 NOTE — Procedures (Signed)
Successful fluoro guided conversion of GJ to J tube. J tube is ready for immediate use. EBL: None No immediate post procedural complications.  Ronny Bacon, MD Pager #: [phone]

## 2016-09-16 ENCOUNTER — Ambulatory Visit (HOSPITAL_COMMUNITY)
Admission: RE | Admit: 2016-09-16 | Discharge: 2016-09-16 | Disposition: A | Discharge status: Home / Self Care | Payer: Medicare Other | Source: Ambulatory Visit | Attending: Interventional Radiology | Admitting: Interventional Radiology

## 2016-09-16 ENCOUNTER — Other Ambulatory Visit (HOSPITAL_COMMUNITY): Payer: Self-pay | Admitting: Interventional Radiology

## 2016-09-16 ENCOUNTER — Encounter (HOSPITAL_COMMUNITY): Payer: Self-pay | Admitting: Radiology

## 2016-09-16 DIAGNOSIS — R633 Feeding difficulties, unspecified: Secondary | ICD-10-CM

## 2016-09-16 HISTORY — PX: IR GENERIC HISTORICAL: IMG1180011

## 2016-09-16 NOTE — Procedures (Signed)
Patient came in with concerns that the newly placed j tube was not flushing as expected.  The gravity feedings taking longer than before.  I evaluated the catheter and it flushed easily with a 10 cc syringe.  The mom also used the home syringe without difficulty.  I advised the patient's mom to try using the 10 cc syring after medication administration to help clear any residual in the catheter. Also, I suggested to slightly recline the patient if the feedings slow down since we had no problems at all when the patient was reclined on the stretcher.  They will call us back if they experience any further problems and we will discuss if xrays are needed.  I discussed this with Dr Vernard Gambles and he was in agreement.

## 2016-09-17 ENCOUNTER — Other Ambulatory Visit (HOSPITAL_COMMUNITY): Payer: Self-pay

## 2016-09-18 ENCOUNTER — Encounter: Payer: Self-pay | Admitting: Family Medicine

## 2016-09-28 ENCOUNTER — Telehealth: Payer: Self-pay | Admitting: Internal Medicine

## 2016-09-28 NOTE — Telephone Encounter (Signed)
Mom given an appt for 10/02/16 9:45

## 2016-09-29 ENCOUNTER — Other Ambulatory Visit (HOSPITAL_COMMUNITY): Payer: Self-pay | Admitting: Interventional Radiology

## 2016-09-29 ENCOUNTER — Encounter (HOSPITAL_COMMUNITY): Payer: Self-pay | Admitting: Interventional Radiology

## 2016-09-29 ENCOUNTER — Ambulatory Visit (HOSPITAL_COMMUNITY)
Admission: RE | Admit: 2016-09-29 | Discharge: 2016-09-29 | Disposition: A | Discharge status: Home / Self Care | Payer: Medicare Other | Source: Ambulatory Visit | Attending: Interventional Radiology | Admitting: Interventional Radiology

## 2016-09-29 DIAGNOSIS — Z434 Encounter for attention to other artificial openings of digestive tract: Secondary | ICD-10-CM | POA: Insufficient documentation

## 2016-09-29 DIAGNOSIS — K9423 Gastrostomy malfunction: Secondary | ICD-10-CM | POA: Diagnosis not present

## 2016-09-29 DIAGNOSIS — R633 Feeding difficulties, unspecified: Secondary | ICD-10-CM

## 2016-09-29 HISTORY — PX: IR GENERIC HISTORICAL: IMG1180011

## 2016-09-29 MED ORDER — IOPAMIDOL (ISOVUE-300) INJECTION 61%
INTRAVENOUS | Status: AC
  Administered 2016-09-29: 35 mL
  Filled 2016-09-29: qty 50

## 2016-10-02 ENCOUNTER — Ambulatory Visit (INDEPENDENT_AMBULATORY_CARE_PROVIDER_SITE_OTHER): Payer: Medicare Other | Admitting: Internal Medicine

## 2016-10-02 ENCOUNTER — Encounter: Payer: Self-pay | Admitting: Internal Medicine

## 2016-10-02 VITALS — BP 86/60 | HR 72 | Ht 62.0 in | Wt 93.0 lb

## 2016-10-02 DIAGNOSIS — R195 Other fecal abnormalities: Secondary | ICD-10-CM

## 2016-10-02 DIAGNOSIS — K9423 Gastrostomy malfunction: Secondary | ICD-10-CM | POA: Diagnosis not present

## 2016-10-02 DIAGNOSIS — Z931 Gastrostomy status: Secondary | ICD-10-CM | POA: Diagnosis not present

## 2016-10-02 DIAGNOSIS — G800 Spastic quadriplegic cerebral palsy: Secondary | ICD-10-CM | POA: Diagnosis not present

## 2016-10-02 MED ORDER — DIPHENOXYLATE-ATROPINE 2.5-0.025 MG PO TABS: ORAL_TABLET | ORAL | 2 refills | Status: DC

## 2016-10-02 NOTE — Progress Notes (Signed)
Subjective:    Patient ID: [name], male    DOB: 04/06/1986, 30 y.o.   MRN: [id number]  HPI [name] is a 30 year old male with history of cerebral palsy, severe scoliosis/kyphosis with prior spinal fusion, dysphagia and regurgitation now dependent on G-tube feedings who is here for follow-up. He was last in the office one year ago. He is here today with his mother.  He has had multiple manipulations and change out of his feeding tube with interventional radiology. He has had previously just simply gastrostomy tube, then GJ tube then just J-tube. 2 days ago IR replaced his J-tube with a GJ tube due to kinking and trouble with tube obstruction. His mother and father who take excellent care of him have had issues with this tube multiple times in the past year. When he was getting simply gastric feeding he was having more frequent issue with cough, regurgitation and concern for aspiration. We tried him on Reglan but he did not tolerate this medication and had delusion, confusion and psychiatric side effects. The medication stopped and these symptoms resolved. The tube seems to be working well since change out 2 days ago. He is getting about 8-10 hours of nocturnal J-tube feedings with tube feedings alternating between Jevity and Vital 1.5. When he was taking vital 1.5 only his stools were too loose, but Jevity alone seem to be too hard to digest. On current tube feeds his stools have been better but he will still have some days where he will have 2-3 consecutive loose bowel movements which are difficult to control. No blood in his stool or report of melena. He does have a chronic cough due to issues managing secretions. His mother clearly notes that his cough improves when his feeding is via the J-tube rather than the G-tube.    No vomiting recently. No reported abdominal pain.  He did recently run a marathon in [place]. He was pushed by a marathon runner from [place] state. He thoroughly enjoyed  this and plans to run again if possible perhaps in [place] near his parents hometown.   Review of Systems As per history of present illness, otherwise negative  Current Medications, Allergies, Past Medical History, Past Surgical History, Family History and Social History were reviewed in [employer] record.     Objective:   Physical Exam BP (!) 86/60   Pulse 72   Ht 5\' 2"  (1.575 m)   Wt 93 lb (42.2 kg) Comment: pt in wheel chair; unable to weigh; used most recent  BMI 17.01 kg/m  Constitutional: Well-developed and well-nourished. No distress. HEENT: Normocephalic and atraumatic.  Conjunctivae are normal.  No scleral icterus. Abdominal: Soft, nontender, nondistended. Bowel sounds active throughout. GJ tube intact Extremities: Contractures Neurological: Alert makes eye contact, verbalizes sounds without words. Skin: Skin is warm and dry.  Psychiatric: Normal mood and affect. Behavior is normal.     Assessment & Plan:  30 year old male with history of cerebral palsy, severe scoliosis/kyphosis with prior spinal fusion, dysphagia and regurgitation now dependent on G-tube feedings who is here for follow-up.  1. Cerebral palsy with spasticity/tube feeding dependence -- he has had many issues recently with his feeding tube, primarily becoming kinked or clogged. He clearly does better with feeding the small bowel rather than stomach particularly from a reflux and aspiration perspective. He now has a GJ tube. He is receiving medications via the gastric tube as well as some supplemental hydration but all feedings via the jejunal tube. Hopefully  this will be more long lasting. --We spent time today discussing options. One option which may be necessary is a surgically placed J-tube which could be better anchored in place and hopefully more long lasting. He would likely keep his current G-tube for medication only. His mother feels that this would be an option if necessary as they  have had multiple trips to and from the IR suite. She understands this will require surgery and another access point to the small bowel. --Domperidone is a potential option if we are forced to go back to gastric feedings. Would need EKG first to rule out QT prolongation. Reglan caused intolerable neurologic/psychiatric side effects. --Continue tube feeding formula per [facility] nutrition specialist.  2. Intermittent loose stools -- Lomotil 1-2 caps 3 times a day when necessary for loose stools  Can follow-up on an as needed basis 25 minutes spent with the patient today. Greater than 50% was spent in counseling and coordination of care with the patient

## 2016-10-02 NOTE — Patient Instructions (Signed)
We have sent the following medications to your pharmacy for you to pick up at your convenience: Lomotil 1-2 tablets via tub three times daily as needed for loose stool  If you are age 32 or older, your body mass index should be between 23-30. Your Body mass index is 17.01 kg/m. If this is out of the aforementioned range listed, please consider follow up with your Primary Care Provider.  If you are age 7 or younger, your body mass index should be between 19-25. Your Body mass index is 17.01 kg/m. If this is out of the aformentioned range listed, please consider follow up with your Primary Care Provider.

## 2016-10-05 DIAGNOSIS — R221 Localized swelling, mass and lump, neck: Secondary | ICD-10-CM | POA: Diagnosis not present

## 2016-10-06 ENCOUNTER — Other Ambulatory Visit (HOSPITAL_BASED_OUTPATIENT_CLINIC_OR_DEPARTMENT_OTHER): Payer: Self-pay | Admitting: Otolaryngology

## 2016-10-07 ENCOUNTER — Other Ambulatory Visit (HOSPITAL_BASED_OUTPATIENT_CLINIC_OR_DEPARTMENT_OTHER): Payer: Self-pay | Admitting: Otolaryngology

## 2016-10-07 DIAGNOSIS — R221 Localized swelling, mass and lump, neck: Secondary | ICD-10-CM

## 2016-10-20 ENCOUNTER — Ambulatory Visit (HOSPITAL_BASED_OUTPATIENT_CLINIC_OR_DEPARTMENT_OTHER): Payer: Medicare Other

## 2016-11-05 ENCOUNTER — Encounter: Payer: Self-pay | Admitting: Internal Medicine

## 2016-11-06 DIAGNOSIS — G800 Spastic quadriplegic cerebral palsy: Secondary | ICD-10-CM | POA: Diagnosis not present

## 2016-11-10 ENCOUNTER — Other Ambulatory Visit (HOSPITAL_BASED_OUTPATIENT_CLINIC_OR_DEPARTMENT_OTHER): Payer: Self-pay

## 2016-11-10 DIAGNOSIS — H10023 Other mucopurulent conjunctivitis, bilateral: Secondary | ICD-10-CM | POA: Diagnosis not present

## 2016-11-13 ENCOUNTER — Ambulatory Visit (HOSPITAL_BASED_OUTPATIENT_CLINIC_OR_DEPARTMENT_OTHER)
Admission: RE | Admit: 2016-11-13 | Discharge: 2016-11-13 | Disposition: A | Discharge status: Home / Self Care | Payer: Medicare Other | Source: Ambulatory Visit | Attending: Otolaryngology | Admitting: Otolaryngology

## 2016-11-13 ENCOUNTER — Encounter (HOSPITAL_BASED_OUTPATIENT_CLINIC_OR_DEPARTMENT_OTHER): Payer: Self-pay

## 2016-11-13 DIAGNOSIS — G809 Cerebral palsy, unspecified: Secondary | ICD-10-CM | POA: Diagnosis not present

## 2016-11-13 DIAGNOSIS — R221 Localized swelling, mass and lump, neck: Secondary | ICD-10-CM | POA: Diagnosis present

## 2016-11-13 DIAGNOSIS — J180 Bronchopneumonia, unspecified organism: Secondary | ICD-10-CM | POA: Diagnosis not present

## 2016-11-13 MED ORDER — IOPAMIDOL (ISOVUE-300) INJECTION 61%
100.0000 mL | Freq: Once | INTRAVENOUS | Status: AC | PRN
  Administered 2016-11-13: 75 mL via INTRAVENOUS

## 2016-11-26 ENCOUNTER — Encounter: Payer: Self-pay | Admitting: Adult Health

## 2016-11-26 ENCOUNTER — Ambulatory Visit (INDEPENDENT_AMBULATORY_CARE_PROVIDER_SITE_OTHER): Payer: Medicare Other | Admitting: Adult Health

## 2016-11-26 DIAGNOSIS — J209 Acute bronchitis, unspecified: Secondary | ICD-10-CM | POA: Diagnosis not present

## 2016-11-26 DIAGNOSIS — M419 Scoliosis, unspecified: Secondary | ICD-10-CM

## 2016-11-26 DIAGNOSIS — J984 Other disorders of lung: Secondary | ICD-10-CM | POA: Diagnosis not present

## 2016-11-26 MED ORDER — AZITHROMYCIN 200 MG/5ML PO SUSR: 250.0000 mg | Freq: Every day | ORAL | 0 refills | Status: DC

## 2016-11-26 NOTE — Progress Notes (Signed)
@Patient  ID: [name], male    DOB: 04/06/1986, 31 y.o.   MRN: [id number]  Chief Complaint  Patient presents with  . Acute Visit    bronchitis     Referring provider: [name], MD  HPI: 31 year old male.- accompanied by mother for follow-up of recurrent pneumonias due to poor secretion clearance. He has cerebral palsy and needs assistance for all activities of daily living, baseline wheelchair-bound. Underwent T9-C6 spinal fusion at [facility] in Sept 2011.  PEG placed in 2012 for hydration.  He was hospitalized in 08/2013 for aspiration pneumonia,Swallow evaluation showed severe dysphagia with high risk of aspirating. He has 4 caregivers at home.  Started back on tube feeds exclusively due to aspiration 07/2014  01/30/2015 - PEJ placed   11/26/2016 Acute OV  Pt presents for an acute office visit. . Over last 1-2 days with increased cough and congestion . No fever, change in behavior, discolored mucus or n/v.d  He is accompanied by mother and care giver . She is here to be seen today and wanted to bring in to make sure he is okay.  He is happy today and smiling. Usually when he gets sick he is agitated and moans.  He remains on VEST Twice daily  . Uses mucinex liquid sometimes.    Allergies  Allergen Reactions  . Ambien [Zolpidem Tartrate] Nausea Only  . Antihistamines, Chlorpheniramine-Type     Other reaction(s): Other (See Comments) Other Reaction: agitation  . Codeine Other (See Comments)    Makes patient too active after a few days.  . Metoclopramide Other (See Comments)    Delusion, emotionality   . Baclofen Anxiety  . Sulfonamide Derivatives Rash    Immunization History  Administered Date(s) Administered  . Hep A / Hep B 04/07/2011, 05/14/2011  . Influenza Split 09/01/2011  . Influenza Whole 10/11/2007, 08/22/2008, 08/15/2009, 08/05/2010, 08/11/2012  . Influenza,inj,Quad PF,36+ Mos 08/06/2013, 07/16/2014, 08/15/2015, 08/27/2016  . PPD Test  04/07/2011  . Pneumococcal Conjugate-13 08/27/2016  . Pneumococcal Polysaccharide-23 08/15/2009  . Td 06/14/2002, 06/19/2009    Past Medical History:  Diagnosis Date  . Cerebral palsy ([place])   . Dehydration 11/22/2013  . Depression with anxiety 08/01/2010   Qualifier: Diagnosis of  By: Nelson-Smith CMA (AAMA), Dottie    . Esophagitis 2011  . Gastrostomy in place [facility]) 08/31/2013  . GERD (gastroesophageal reflux disease)   . Hyperlipidemia, mild 08/25/2015  . Hyperthyroidism   . Incontinence of feces   . Loss of weight 08/28/2014  . Medicare annual wellness visit, subsequent 08/25/2015  . Palpitations     Tobacco History: History  Smoking Status  . Never Smoker  Smokeless Tobacco  . Never Used   Counseling given: Not Answered   Outpatient Encounter Prescriptions as of 11/26/2016  Medication Sig  . AMBULATORY NON FORMULARY MEDICATION Medication Name: MIC gastrostomy/bolus feeding tube 24 French Part number 0110-24. #2 and 10 cc lurer lock syringe #2 Dx:  . bacitracin 500 UNIT/GM ointment Apply 1 application topically 2 (two) times daily.  . clotrimazole-betamethasone (LOTRISONE) cream Apply 1 application topically 2 (two) times daily.  . dantrolene (DANTRIUM) 50 MG capsule TAKE 1 CAPSULE BY MOUTH 3 TIMES A DAY.  . diphenoxylate-atropine (LOMOTIL) 2.5-0.025 MG tablet Take 1-2 tablets via tub three times daily as needed for loose stool  . divalproex (DEPAKOTE SPRINKLE) 125 MG capsule Take 2 capsules in morning, 5 capsules at bedtime (Patient taking differently: 250-625 mg. Take 2 capsules in morning, 5 capsules  at bedtime)  . fluticasone (FLONASE) 50 MCG/ACT nasal spray Place 2 sprays into both nostrils daily. (Patient taking differently: Place 2 sprays into both nostrils daily. AS NEEDED)  . Incontinence Supply Disposable (PREVAIL BREEZERS MEDIUM) MISC pkg of 16- size medium 32" to 44"  Breathable cloth-like outer fabric (can't use the plastic outer surgace  Item # [id number]/[id number]    . LORazepam (ATIVAN) 1 MG tablet Take one tablet in evenings as needed for insomnia.  . mupirocin ointment (BACTROBAN) 2 % Apply to area thin film twice daily if needed  . NON FORMULARY Bard Leg Bag Extension tubing w/Connector 18", Sterile, latex-free  Item# [id number]  . NON FORMULARY Colorplast Freedom Cath Latex Self-Adhering Male External Catheter 65mm Diameter Intermediate  Item# [id number]  . NON FORMULARY NUTRITIONAL VITAL 1.5 SUPPLEMENT  -run tube feed @@ 77ml  . NONFORMULARY OR COMPOUNDED Suzy Bouchard REF [id number] - Kangaroo Joey Pump Set with Flush Bags - 1000 mL  . Nutritional Supplements (FEEDING SUPPLEMENT, JEVITY 1.5 CAL,) LIQD Run tube feed at 70 mL per hour over 17 hours.  Marland Kitchen nystatin cream (MYCOSTATIN) Apply 1 application topically 2 (two) times daily as needed for dry skin.  Marland Kitchen OLANZapine (ZYPREXA) 2.5 MG tablet Place 1 tablet into feeding tube 3 (three) times daily.  Earney Navy Bicarbonate (ZEGERID) 20-1100 MG CAPS capsule Take 1 capsule by mouth daily before breakfast.  . Ostomy Supplies (PROTECTIVE BARRIER WIPES) MISC 1-1/4" X 3"  Item ID:[id number]  . PARoxetine (PAXIL) 10 MG tablet Take 15 mg by mouth daily.  Marland Kitchen azithromycin (ZITHROMAX) 200 MG/5ML suspension Take 6.3 mLs (250 mg total) by mouth daily.   No facility-administered encounter medications on file as of 11/26/2016.      Review of Systems per mother :  +chronic cough/congestion  Hard to obtain as pt is nonverbal  Physical Exam  BP 109/76 (BP Location: Left Arm, Cuff Size: Normal)   Pulse 82   Wt 96 lb (43.5 kg)   SpO2 96%   BMI 17.56 kg/m   GEN: Pleasant /NAD , appears happy smiling in power chair    HEENT:  [place]/AT,     NECK:  no lymphadenopathy.    RESP  Clear  P & A; w/o, wheezes/ rales/ or rhonchi. no accessory muscle use, no dullness to percussion  CARD:  RRR, no m/r/g, no peripheral edema, pulses intact, no cyanosis or clubbing.  GI:   Soft & nt; nml bowel sounds; no organomegaly or  masses detected.   Musco: +contractures of hands /in wc   Neuro: calm today , smiling   Skin: Warm, no lesions or rashes  Lab Results:  CBC    Component Value Date/Time   WBC 6.2 09/02/2016 1513   RBC 4.66 09/02/2016 1513   HGB 14.6 09/02/2016 1513   HCT 42.5 09/02/2016 1513   PLT 219.0 09/02/2016 1513   MCV 91.3 09/02/2016 1513   MCH 30.3 11/21/2015 1434   MCHC 34.3 09/02/2016 1513   RDW 12.2 09/02/2016 1513   LYMPHSABS 1.8 09/02/2016 1513   MONOABS 0.3 09/02/2016 1513   EOSABS 0.1 09/02/2016 1513   BASOSABS 0.0 09/02/2016 1513    BMET    Component Value Date/Time   NA 140 08/04/2016 1501   K 4.0 08/04/2016 1501   CL 104 08/04/2016 1501   CO2 29 08/04/2016 1501   GLUCOSE 83 08/04/2016 1501   BUN 8 08/04/2016 1501   CREATININE 0.34 (L) 08/04/2016 1501   CREATININE 0.36 (L) 11/21/2015 1434   CALCIUM  9.2 08/04/2016 1501   GFRNONAA >90 09/18/2014 1123   GFRAA >90 09/18/2014 1123    BNP No results found for: BNP  ProBNP No results found for: PROBNP  Imaging: Ct Soft Tissue Neck W Contrast  Result Date: 11/13/2016 CLINICAL DATA:  31 year old male with cerebral palsy. Caregivers report an intermittent neck mass, although the area of clinical concern could not be identified or specified today. Initial encounter. EXAM: CT NECK WITH CONTRAST TECHNIQUE: Multidetector CT imaging of the neck was performed using the standard protocol following the bolus administration of intravenous contrast. CONTRAST:  35mL ISOVUE-300 IOPAMIDOL (ISOVUE-300) INJECTION 61% COMPARISON:  Neck CT 09/06/2014. FINDINGS: Pharynx and larynx: Negative larynx. There is a small volume of retained secretions in the hypopharynx including both piriform sinuses (series 3, image 65). Otherwise negative hypopharynx. Small volume of retained secretions in the oropharynx. Negative nasopharynx. Negative parapharyngeal and retropharyngeal spaces. Salivary glands: Negative sublingual space. The left submandibular  gland does appear asymmetrically larger than the right gland (series 3, image 58 on the left versus image 46 on the right) but there is no submandibular gland inflammation or mass. No sialolithiasis. The left gland was somewhat larger in 08-Feb-2014 also. Both parotid glands are within normal limits. Thyroid: Negative. Lymph nodes: Cervical lymph nodes are stable since 2014-02-08 and within normal limits. Vascular: Major vascular structures in the neck and at the skullbase are patent. Limited intracranial: Stable , negative. Visualized orbits: Negative. Mastoids and visualized paranasal sinuses: Mild ethmoid and sphenoid sinus mucosal thickening. There is opacification of some left anterior ethmoid air cells which is new since 2014-02-08 (series 6, image 1). The bilateral tympanic cavities and mastoids are clear. Skeleton: Kyphoscoliosis and exaggerated cervical lordosis status bilateral posterior spinal rod fusion. Solid appearing posterior element arthrodesis throughout the visible spine. There is an upper thoracic intrathecal catheter or neural stimulator which is unchanged in position since 02/08/2014. Upper chest: There is a 2 cm area of distal peribronchial opacity in the right upper lobe today which appears inflammatory. The previously-seen moderate to large right pleural effusion with compressive atelectasis has resolved. Visible airways are patent. The visible left lung is clear. No superior mediastinal or visible axillary lymphadenopathy. IMPRESSION: 1. No acute or inflammatory process identified in the neck (see #2). No neck mass or lymphadenopathy. There is mild asymmetry of the submandibular glands (the right is smaller) which seems chronic but more pronounced since Feb 08, 2014. No associated sialolithiasis. 2. Small peripheral bronchopneumonia in the right upper lobe. No right pleural effusion identified. There is a small volume of layering retained secretions in the pharynx, but the visible trachea and airways are clear.  Electronically Signed   By: Genevie [name] M.D.   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 -to have on hold if mucus worsens and does not improve .  Continue on VEST Twice daily  .  Mucinex liquid per tube As needed   Please contact office for sooner follow up if symptoms do not improve or worsen or seek emergency care  Follow up Dr Elsworth Soho  Next  month and As needed         Rexene Edison, NP 11/26/2016

## 2016-11-26 NOTE — Patient Instructions (Addendum)
Azithromycin per tube for 7 days . -to have on hold if mucus worsens and does not improve .  Continue on VEST Twice daily  .  Mucinex liquid per tube As needed   Please contact office for sooner follow up if symptoms do not improve or worsen or seek emergency care  Follow up Dr Elsworth Soho  Next  month and As needed

## 2016-11-26 NOTE — Assessment & Plan Note (Signed)
Cont w/ VEST use

## 2016-11-26 NOTE — Assessment & Plan Note (Signed)
Mild flare with URI  Cont w/ mucucillary clearance   Plan  Patient Instructions  Azithromycin per tube for 7 days . -to have on hold if mucus worsens and does not improve .  Continue on VEST Twice daily  .  Mucinex liquid per tube As needed   Please contact office for sooner follow up if symptoms do not improve or worsen or seek emergency care  Follow up Dr Elsworth Soho  Next  month and As needed

## 2016-12-01 NOTE — Progress Notes (Signed)
Reviewed & agree with plan  

## 2016-12-08 DIAGNOSIS — H10023 Other mucopurulent conjunctivitis, bilateral: Secondary | ICD-10-CM | POA: Diagnosis not present

## 2016-12-09 ENCOUNTER — Other Ambulatory Visit: Payer: Self-pay | Admitting: Family Medicine

## 2016-12-09 ENCOUNTER — Encounter: Payer: Self-pay | Admitting: Family Medicine

## 2016-12-09 DIAGNOSIS — G800 Spastic quadriplegic cerebral palsy: Secondary | ICD-10-CM

## 2016-12-11 ENCOUNTER — Telehealth: Payer: Self-pay | Admitting: Pulmonary Disease

## 2016-12-11 MED ORDER — OSELTAMIVIR PHOSPHATE 75 MG PO CAPS: 75.0000 mg | ORAL_CAPSULE | Freq: Every day | ORAL | 0 refills | Status: DC

## 2016-12-11 NOTE — Telephone Encounter (Signed)
Spoke with pt's mother. She is aware of MR's recommendation. Rx has been sent in. Nothing further was needed.

## 2016-12-11 NOTE — Telephone Encounter (Signed)
tamfilu 75mg  daily x 10 days (ensure normal renal funcion by their hx)  Dr. Brand Males, M.D., [facility].C.P Pulmonary and Critical Care Medicine Staff Physician [facility] Pager: [phone], If no answer or between  15:00h - 7:00h: call [phone]  12/11/2016 3:31 PM

## 2016-12-11 NOTE — Telephone Encounter (Signed)
Spoke with pt's mother. States that her husband was diagnosed with the flu on Wednesday. Pt's mother would like Tamiflu for the pt for preventative measures. Pt is not currently having any symptoms.  MR - please advise. Thanks.

## 2016-12-16 IMAGING — DX DG CHEST 2V
2 series · 2 of 2 positions shown · non-contrast
Comparison: 01/22/2016

CLINICAL DATA: Cough and congestion for 1 month

EXAM:
CHEST  2 VIEW

[chest lat]
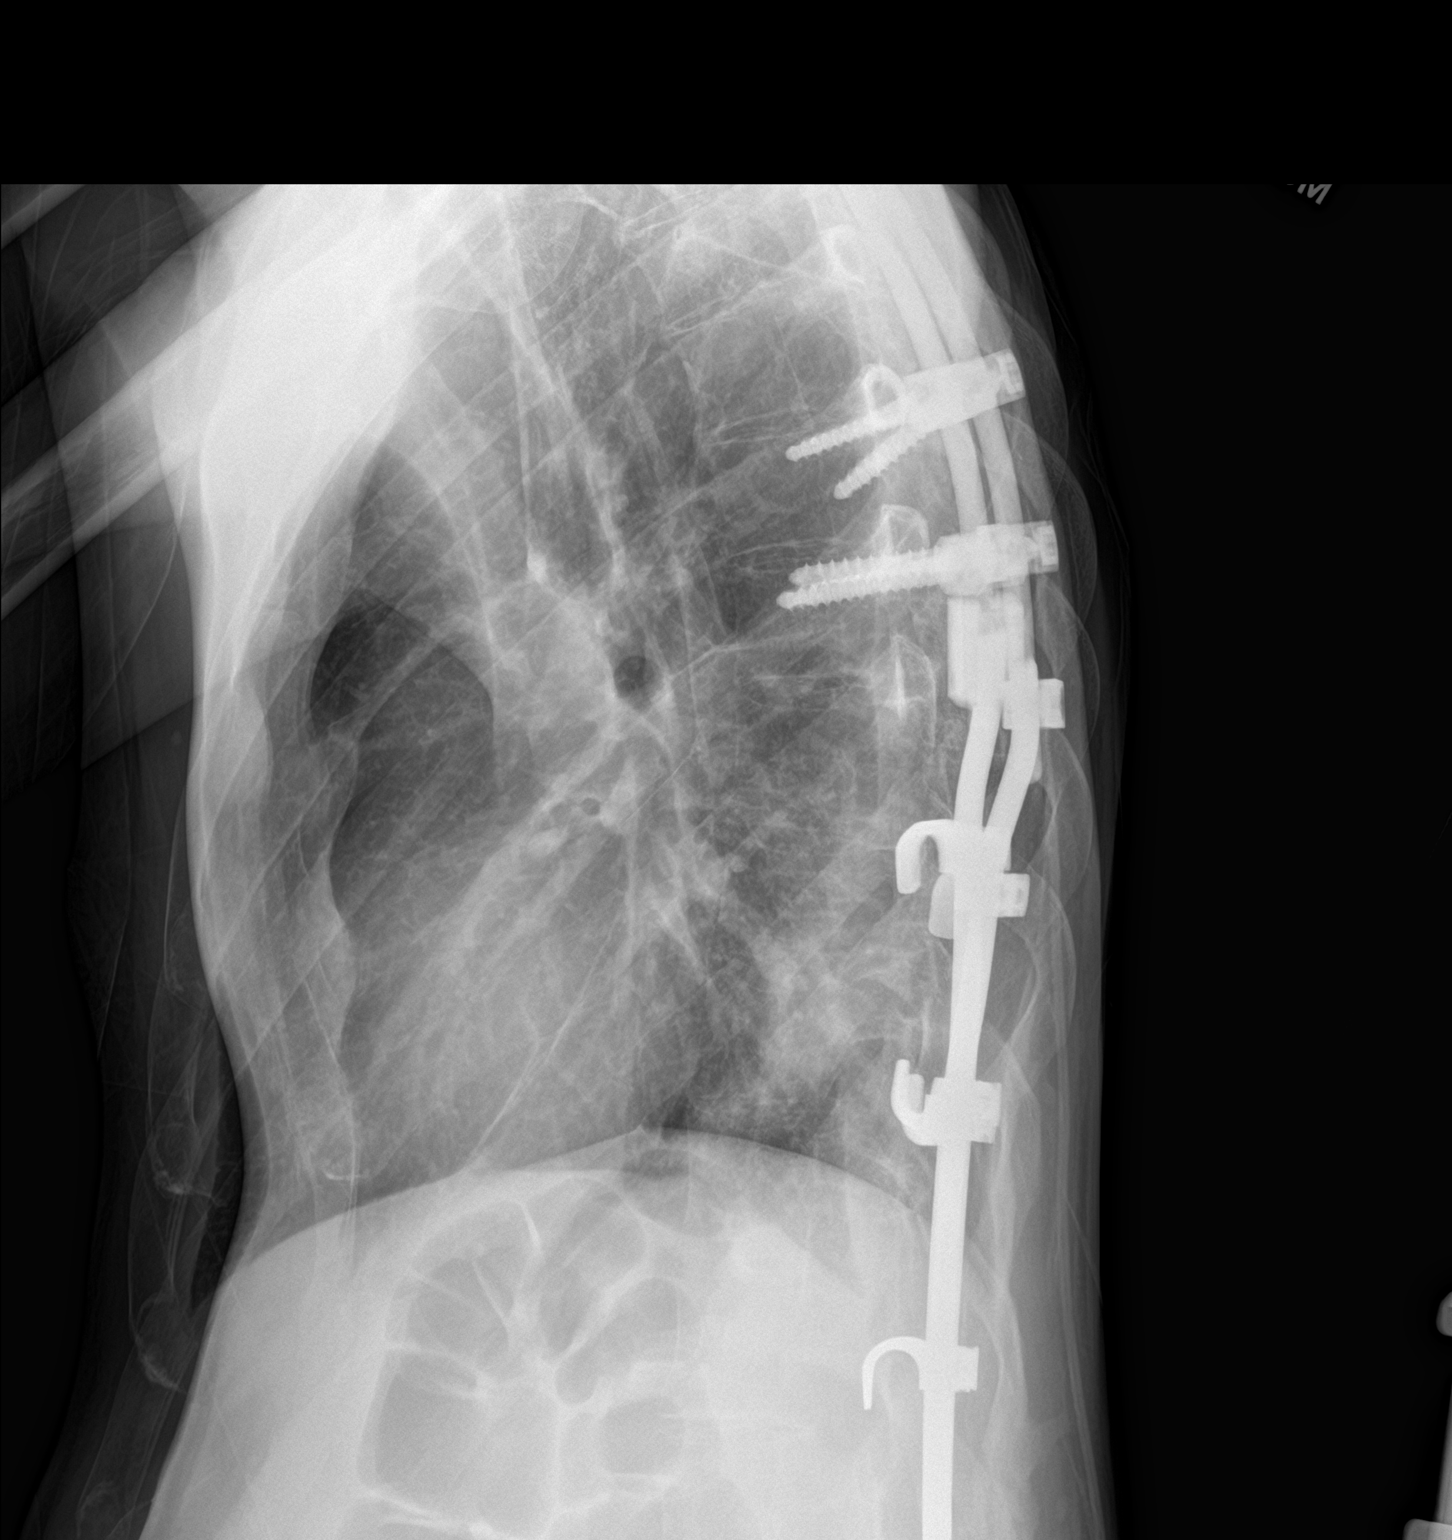

[chest ap]
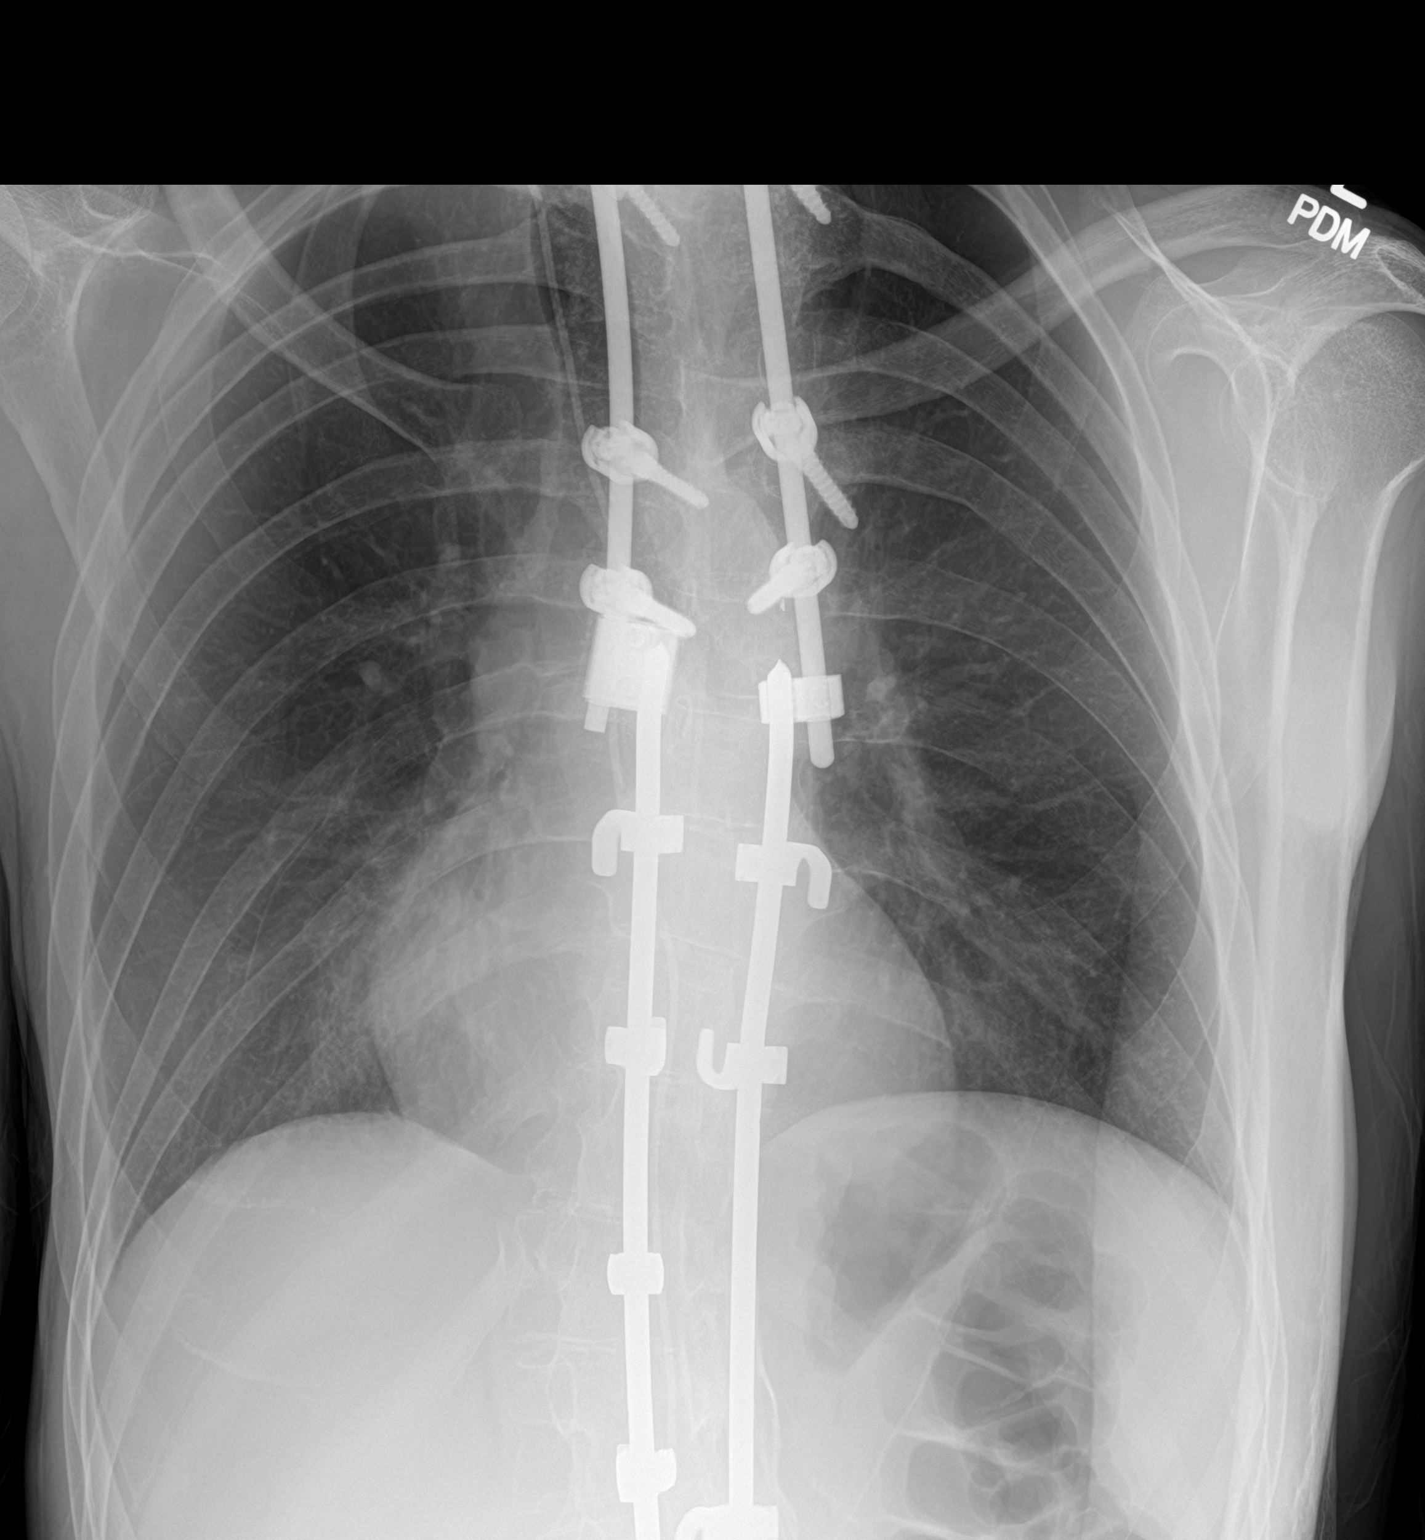

[2 of 2 positions shown; findings below may reference images not displayed]

FINDINGS: Cardiac shadow is stable. Persistent right basilar density has
improved somewhat in the interval from the prior exam. Catheter is
again noted over the posterior chest stable from the prior exam.
Postsurgical changes are noted in the thoracic spine stable from the
prior study. No new focal abnormality is seen. No acute bony
abnormality is noted
IMPRESSION: Slightly improved density in the right medial lung base when
compared with the prior exam.

No new focal abnormality is seen.

## 2016-12-22 DIAGNOSIS — F339 Major depressive disorder, recurrent, unspecified: Secondary | ICD-10-CM | POA: Diagnosis not present

## 2016-12-24 ENCOUNTER — Ambulatory Visit: Payer: Self-pay | Admitting: Pulmonary Disease

## 2016-12-29 DIAGNOSIS — L0291 Cutaneous abscess, unspecified: Secondary | ICD-10-CM | POA: Diagnosis not present

## 2017-01-04 DIAGNOSIS — G808 Other cerebral palsy: Secondary | ICD-10-CM | POA: Diagnosis not present

## 2017-01-06 ENCOUNTER — Ambulatory Visit: Payer: Medicare Other | Attending: Family Medicine | Admitting: Physical Therapy

## 2017-01-06 DIAGNOSIS — M6281 Muscle weakness (generalized): Secondary | ICD-10-CM | POA: Diagnosis not present

## 2017-01-06 NOTE — Therapy (Signed)
[facility] [address] [place], [place], [address] Phone: [phone]   Fax:  [phone]  Physical Therapy Evaluation  Patient Details  Name: [name] MRN: [id number] Date of Birth: [birth year] Referring Provider: Dr. [name]  Encounter Date: 01/06/2017      PT End of Session - 01/06/17 1244    Visit Number 1   Authorization Type Medicare   PT Start Time 1055   PT Stop Time 1206   PT Time Calculation (min) 71 min      Past Medical History:  Diagnosis Date  . Cerebral palsy ([place])   . Dehydration 11/22/2013  . Depression with anxiety 08/01/2010   Qualifier: Diagnosis of  By: Nelson-Smith CMA (AAMA), Dottie    . Esophagitis 2011  . Gastrostomy in place [facility]) 08/31/2013  . GERD (gastroesophageal reflux disease)   . Hyperlipidemia, mild 08/25/2015  . Hyperthyroidism   . Incontinence of feces   . Loss of weight 08/28/2014  . Medicare annual wellness visit, subsequent 08/25/2015  . Palpitations     Past Surgical History:  Procedure Laterality Date  . baclofen trial    . baslofen pump implant    . ears tubes    . EYE SURGERY    . FLEXIBLE SIGMOIDOSCOPY N/A 09/07/2014   Procedure: FLEXIBLE SIGMOIDOSCOPY;  Surgeon: [name], MD;  Location[facility] ENDOSCOPY;  Service: Endoscopy;  Laterality: N/A;  . g-tube insert  August 2006  . hamstring released     to treat contractures.   Marland Kitchen HIP SURGERY     x2 , side   . IR GENERIC HISTORICAL  07/01/2016   IR GASTR TUBE CONVERT GASTR-JEJ PER W/FL MOD SED 07/01/2016 [name], MD [facility]-INTERV RAD  . IR GENERIC HISTORICAL  07/08/2016   IR PATIENT EVAL TECH 0-60 MINS 07/08/2016 [name], MD [facility]  . IR GENERIC HISTORICAL  07/14/2016   IR GJ TUBE CHANGE 07/14/2016 [name], MD [facility]  . IR GENERIC HISTORICAL  07/21/2016   IR PATIENT EVAL TECH 0-60 MINS [facility]  . IR GENERIC HISTORICAL  08/31/2016   IR [place] DUODEN/JEJUNO TUBE PERCUT W/FLUORO  08/31/2016 [name], MD [facility]INTER[facility]  . IR GENERIC HISTORICAL  09/03/2016   IR GJ TUBE CHANGE 09/03/2016 [name], MD [facility]-INTERV RAD  . IR GENERIC HISTORICAL  09/10/2016   IR GASTR TUBE CONVERT GASTR-JEJ PER W/FL MOD SED 09/10/2016 [facility]  . IR GENERIC HISTORICAL  09/16/2016   IR PATIENT EVAL TECH 0-60 MINS [facility]  . IR GENERIC HISTORICAL  09/29/2016   IR GJ TUBE CHANGE 09/29/2016 [name], MD [facility]INTER[facility]  . PEG PLACEMENT  10/21/2011   Procedure: PERCUTANEOUS ENDOSCOPIC GASTROSTOMY (PEG) REPLACEMENT;  Surgeon: [name], MD;  Location: [facility] ENDOSCOPY;  Service: Endoscopy;  Laterality: N/A;  . PEG PLACEMENT N/A 06/13/2013   Procedure: PERCUTANEOUS ENDOSCOPIC GASTROSTOMY (PEG) REPLACEMENT;  Surgeon: [name], MD;  Location: [facility] ENDOSCOPY;  Service: Endoscopy;  Laterality: N/A;  . SPINAL FUSION    . spinal fusion to correct 70 degree kyphosis  11-2010  . spinal fusioncorrect 106 degree kyphosis    . SPINE SURGERY  ,11/20/2010, 2011   for correction of severe contracturing spinal kyphosis.   . TONSILLECTOMY      There were no vitals filed for this visit.       Subjective Assessment - 01/06/17 1114    Subjective Pt accompanied to PT by mother - she reports they are here  for modifications to wheelchair - pt has had 26# weight loss due to permanent PEG tube feedings - had alot of problems with PEG tube resulting in numerous changes - now using a J tube   Patient is accompained by: Family member  mother [name]   Pertinent History CP with spastic quadriplegia;     Patient Stated Goals evaluate for needed seating modifications   Currently in Pain? No/denies            [facility] PT Assessment - 01/06/17 1120      Assessment   Medical Diagnosis CP with spastic quadriplegia   Referring Provider Dr. [name]   Onset Date/Surgical Date --  Congenital        LMN for Roho cushion, hip guide and flexure feet to be completed for optimal positioning and  pressure relief in wheelchair.                               Plan - 01/06/17 1144    Clinical Impression Statement Pt is a 31 year old male with spastic CP with quadriplegia.  Pt obtained a new wheelchair in Oct. 2016 and has experienced a 26# weight loss due to problems with PEG tube, requiring numerous procedures with changes.  Pt currently has a Lt pelvic obliquity, resulting in pressure on Lt femoral head with much pressure, placing pt at risk for skin breakdown.  Pt is in need of hip guide for proper pelvic positioning, flexure feet and Roho cushion.  LMN to be written.                                                                                                                                            Rehab Potential Good   PT Frequency One time visit   PT Treatment/Interventions --  wheelchair management   Consulted and Agree with Plan of Care Patient;Family member/caregiver   Family Member Consulted mother [name]      Patient will benefit from skilled therapeutic intervention in order to improve the following deficits and impairments:     Visit Diagnosis: Muscle weakness (generalized) - Plan: PT plan of care cert/re-cert      G-Codes - 45/80/99 1130    Functional Assessment Tool Used (Outpatient Only) clinical judgment - trial of various cushions with pressure mapping performed for achievement of optimal pressure relief provided by each cushion   Functional Limitation Self care   Self Care Current Status (I3382) At least 60 percent but less than 80 percent impaired, limited or restricted   Self Care Goal Status (N0539) At least 60 percent but less than 80 percent impaired, limited or restricted   Self Care Discharge Status [phone]) At least 60 percent but less than 80 percent impaired, limited or restricted       Problem List Patient Active  Problem List   Diagnosis Date Noted  . Cerebral palsy ([place]) 04/14/2016  . Increased oropharyngeal  secretions 04/14/2016  . Medicare annual wellness visit, subsequent 08/25/2015  . Hyperlipidemia, mild 08/25/2015  . Reflux 12/17/2014  . Rectal bleeding 09/06/2014  . Esophageal dysphagia 09/06/2014  . Dysphagia, pharyngoesophageal phase 09/02/2014  . Impetigo 09/02/2014  . Abnormal thyroid function test 06/10/2014  . Acute nonsuppurative otitis media of right ear 05/13/2014  . Acute bronchitis 02/27/2014  . Protein-calorie malnutrition, severe ([place]) 11/23/2013  . Decreased oral intake 11/22/2013  . Gastrostomy in place [facility]) 08/31/2013  . 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 Cervical dystonia 08/23/2013  . Dysphagia, oropharyngeal phase 08/10/2013  . Low back pain 07/11/2013  . 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 Anemia 12/05/2010  . 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 GASTROSTOMY COMPLICATION 30/16/0109  . Infantile cerebral palsy ([place]) 08/04/2010  . Thyrotoxicosis 08/01/2010  . Depression with anxiety 08/01/2010  . GERD 08/01/2010  . OTHER ACNE 10/16/2009  . PALPITATIONS 02/25/2009  . HIP PAIN, BILATERAL 11/06/2008  . NEVI, MULTIPLE 10/09/2008  . Dysphagia 04/02/2008  . Incontinence of feces 04/02/2008  . Urinary incontinence 04/02/2008  . 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 FACIAL RASH 04/29/2007  . PALSY, INFANTILE CEREBRAL, 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    Alda Lea, PT 01/06/2017, 12:45 PM  [facility] [name] [place], [place], [address] Phone: [phone]   Fax:  [phone]  Name: JAMIEL GONCALVES MRN: [id number] Date of Birth: 16-Dec-1985

## 2017-01-07 ENCOUNTER — Encounter: Payer: Self-pay | Admitting: Family Medicine

## 2017-01-08 ENCOUNTER — Other Ambulatory Visit: Payer: Self-pay | Admitting: Family Medicine

## 2017-01-08 ENCOUNTER — Encounter: Payer: Self-pay | Admitting: Family Medicine

## 2017-01-08 DIAGNOSIS — G809 Cerebral palsy, unspecified: Secondary | ICD-10-CM

## 2017-01-08 DIAGNOSIS — G800 Spastic quadriplegic cerebral palsy: Secondary | ICD-10-CM

## 2017-01-08 NOTE — Telephone Encounter (Signed)
Corene Cornea from [facility] called in to make provider aware that they dont carry the supplies that pt is requesting. He said that they would need to request through [place] for insurance purposes.

## 2017-01-29 ENCOUNTER — Ambulatory Visit (HOSPITAL_COMMUNITY)
Admission: RE | Admit: 2017-01-29 | Discharge: 2017-01-29 | Disposition: A | Discharge status: Home / Self Care | Payer: Medicare Other | Source: Ambulatory Visit | Attending: Radiology | Admitting: Radiology

## 2017-01-29 ENCOUNTER — Encounter (HOSPITAL_COMMUNITY): Payer: Self-pay

## 2017-01-29 ENCOUNTER — Other Ambulatory Visit (HOSPITAL_COMMUNITY): Payer: Self-pay | Admitting: Radiology

## 2017-01-29 DIAGNOSIS — R633 Feeding difficulties, unspecified: Secondary | ICD-10-CM

## 2017-02-04 IMAGING — XA IR CONVERT G-TUBE TO G-JTUBE
1 series · 1 of 1 positions shown · non-contrast
Comparison: none

INDICATION: History cerebral palsy with chronic need for nutrition via previous
gastrostomy and gastric jejunal feeding tubes. Current 24 French
balloon retention gastrostomy tube is in place and there is
significant leakage around the tube is well as gastroesophageal
reflux and poor gastric emptying.

[Series 300: tube placements · 1 of 1 slices shown]
[im 1/1]
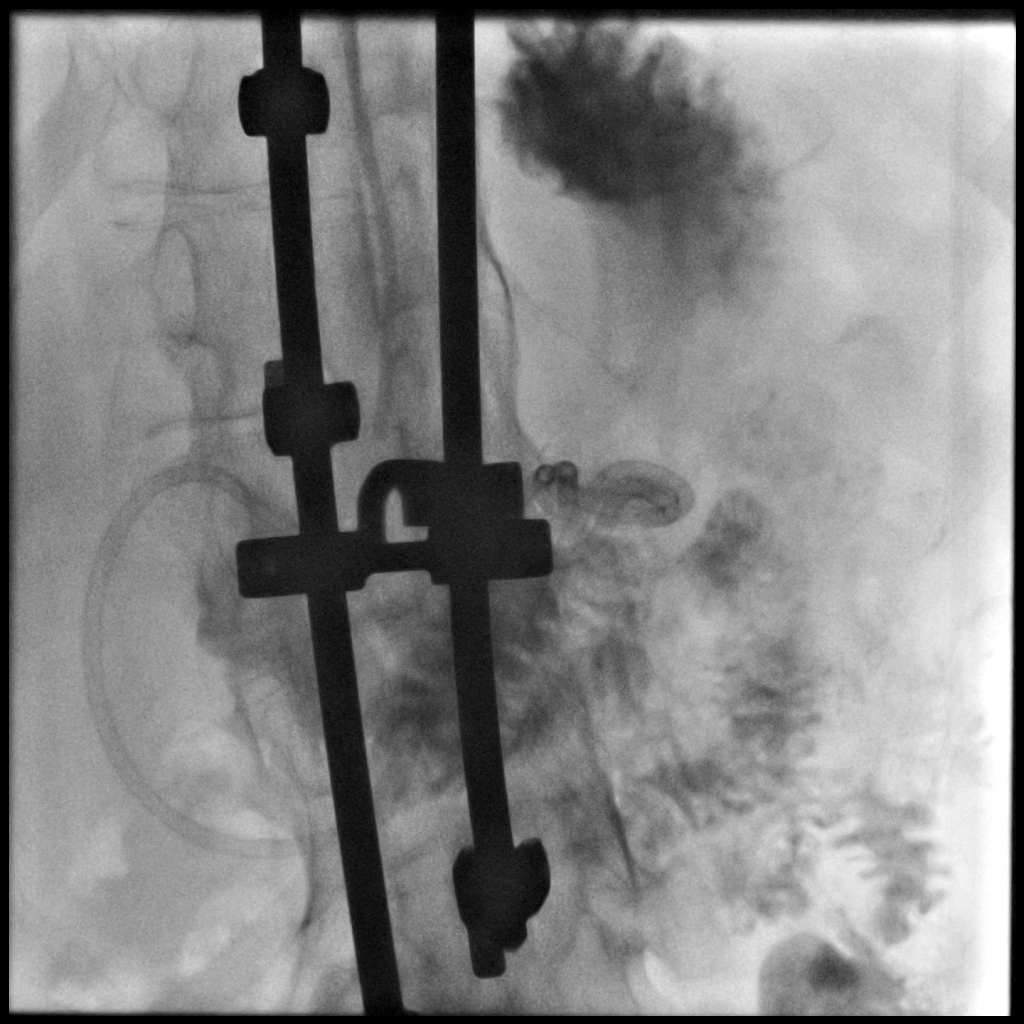

[1 of 1 positions shown; findings below may reference images not displayed]

EXAM:
CONVERT G-TUBE TO G-JTUBE

MEDICATIONS:
None

ANESTHESIA/SEDATION:
None

CONTRAST:  20mL KMJ9U2-833 IOPAMIDOL (KMJ9U2-833) INJECTION 61% -
administered into the gastric lumen.

FLUOROSCOPY TIME:  Fluoroscopy Time: 3 minutes and 24 seconds.

COMPLICATIONS:
None immediate.

PROCEDURE:
Informed written consent was obtained from the patient after a
thorough discussion of the procedural risks, benefits and
alternatives. All questions were addressed. Maximal Sterile Barrier
Technique was utilized including caps, mask, sterile gowns, sterile
gloves, sterile drape, hand hygiene and skin antiseptic. A timeout
was performed prior to the initiation of the procedure.

The pre-existing gastrostomy tube was injected with contrast
material. The tube was removed. A 5 French multipurpose catheter was
advanced into the stomach. The catheter was further advanced into
the duodenum over a hydrophilic wire.

A new 26 French balloon retention gastrojejunal feeding catheter was
advanced over the wire. Catheter position was confirmed by
fluoroscopy after contrast injection.
FINDINGS: After catheter replacement and conversion, the jejunal lumen tip
lies in the proximal jejunum.
IMPRESSION: Conversion of 24 French balloon retention gastrostomy tube to new 26
French balloon retention gastrojejunal feeding tube.

## 2017-02-05 ENCOUNTER — Ambulatory Visit (HOSPITAL_COMMUNITY)
Admission: RE | Admit: 2017-02-05 | Discharge: 2017-02-05 | Disposition: A | Discharge status: Home / Self Care | Payer: Medicare Other | Source: Ambulatory Visit | Attending: Interventional Radiology | Admitting: Interventional Radiology

## 2017-02-05 ENCOUNTER — Encounter (HOSPITAL_COMMUNITY): Payer: Self-pay | Admitting: Interventional Radiology

## 2017-02-05 ENCOUNTER — Other Ambulatory Visit (HOSPITAL_COMMUNITY): Payer: Self-pay | Admitting: Interventional Radiology

## 2017-02-05 DIAGNOSIS — R633 Feeding difficulties, unspecified: Secondary | ICD-10-CM

## 2017-02-05 DIAGNOSIS — R1310 Dysphagia, unspecified: Secondary | ICD-10-CM | POA: Insufficient documentation

## 2017-02-05 DIAGNOSIS — K9413 Enterostomy malfunction: Secondary | ICD-10-CM | POA: Insufficient documentation

## 2017-02-05 DIAGNOSIS — G809 Cerebral palsy, unspecified: Secondary | ICD-10-CM | POA: Diagnosis not present

## 2017-02-05 DIAGNOSIS — Y733 Surgical instruments, materials and gastroenterology and urology devices (including sutures) associated with adverse incidents: Secondary | ICD-10-CM | POA: Insufficient documentation

## 2017-02-05 DIAGNOSIS — K9423 Gastrostomy malfunction: Secondary | ICD-10-CM | POA: Diagnosis not present

## 2017-02-05 HISTORY — PX: IR GJ TUBE CHANGE: IMG1440

## 2017-02-05 MED ORDER — IOPAMIDOL (ISOVUE-300) INJECTION 61%
50.0000 mL | Freq: Once | INTRAVENOUS | Status: AC | PRN
  Administered 2017-02-05: 20 mL via INTRAVENOUS

## 2017-02-05 MED ORDER — IOPAMIDOL (ISOVUE-300) INJECTION 61%
INTRAVENOUS | Status: AC
  Administered 2017-02-05: 20 mL via INTRAVENOUS
  Filled 2017-02-05: qty 50

## 2017-02-05 NOTE — Procedures (Signed)
Interventional Radiology Procedure Note  Procedure: Exchange of malfunctioning GJ tube  Complications: None Recommendations:  - Ok to use -  - Routine care   Signed,  Dulcy Fanny. Earleen Newport, DO

## 2017-02-10 ENCOUNTER — Encounter: Payer: Self-pay | Admitting: Internal Medicine

## 2017-02-10 ENCOUNTER — Encounter: Payer: Self-pay | Admitting: Family Medicine

## 2017-02-11 ENCOUNTER — Ambulatory Visit (INDEPENDENT_AMBULATORY_CARE_PROVIDER_SITE_OTHER): Payer: Medicare Other | Admitting: Pulmonary Disease

## 2017-02-11 ENCOUNTER — Encounter: Payer: Self-pay | Admitting: Pulmonary Disease

## 2017-02-11 ENCOUNTER — Encounter: Payer: Self-pay | Admitting: Family Medicine

## 2017-02-11 DIAGNOSIS — J984 Other disorders of lung: Secondary | ICD-10-CM | POA: Diagnosis not present

## 2017-02-11 DIAGNOSIS — E43 Unspecified severe protein-calorie malnutrition: Secondary | ICD-10-CM

## 2017-02-11 DIAGNOSIS — M419 Scoliosis, unspecified: Secondary | ICD-10-CM

## 2017-02-11 NOTE — Patient Instructions (Signed)
Call us as needed. Letter will be given for Medicaid

## 2017-02-11 NOTE — Assessment & Plan Note (Signed)
Stable, Continue efforts of airway clearance with chest vest twice a day and Mucinex whenever secretion buildup occurs

## 2017-02-11 NOTE — Assessment & Plan Note (Signed)
Suggest adding protein to his tube feeds. Letter given for Medicaid purposes

## 2017-02-11 NOTE — Progress Notes (Signed)
   Subjective:    Patient ID: [name], male    DOB: 06-May-1986, 31 y.o.   MRN: [id number]  HPI  31 year old male.- accompanied by mother for follow-up of recurrent pneumonias due to poor secretion clearance. He has cerebral palsy and needs assistance for all activities of daily living, baseline wheelchair-bound. Underwent T9-C6 spinal fusion at [facility] in Sept 2011.  PEG placed in 2012 for hydration.  01/30/2015 - PEJ placed   Annual follow-up, accompanied by mother, [name]. He had a good winter and no hospitalizations in the past year. He uses the chest vest , and occasionally complains about it. He remains wheelchair bound, he has been able to clear secretions better. Uses Mucinex only as needed. His weight has not improved much, gets about 4 cans of feeding through the day. Last G-tube lasted for a half months  Mom needs a letter again for Medicaid purposes    01/2015 CT chest with contrast -Volume loss in the RIGHT lower lobe with rounding of vessels suggesting rounded atelectasis, Pleural fluid collection at posterior inferior RIGHT hemi thorax  Review of Systems neg for any significant sore throat, dysphagia, itching, sneezing, nasal congestion or excess/ purulent secretions, fever, chills, sweats, unintended wt loss, pleuritic or exertional cp, hempoptysis, orthopnea pnd or change in chronic leg swelling. Also denies presyncope, palpitations, heartburn, abdominal pain, nausea, vomiting, diarrhea or change in bowel or urinary habits, dysuria,hematuria, rash, arthralgias, visual complaints, headache, numbness weakness or ataxia.     Objective:   Physical Exam   Gen. Pleasant, cachectic in no distress ENT - no thrush, no post nasal drip Neck: No JVD, no thyromegaly, no carotid bruits Lungs: no use of accessory muscles, no dullness to percussion, clear without rales or rhonchi  Cardiovascular: Rhythm regular, heart sounds  normal, no murmurs or gallops, no  peripheral edema Musculoskeletal: scoliosis, kphosis        Assessment & Plan:

## 2017-02-16 ENCOUNTER — Ambulatory Visit: Payer: Medicare Other | Admitting: Physical Therapy

## 2017-02-17 IMAGING — XA IR REPLACE G/J TUBE W/ FLUORO
1 series · 6 of 6 positions shown · non-contrast
Comparison: Fluoroscopic guided gas jejunostomy catheter exchange -
07/01/2016

INDICATION: Fractured external component of feeding jejunostomy catheter. Please
perform fluoroscopic guided exchange

EXAM:
FLUOROSCOPIC GUIDED REPLACEMENT OF GASTROJEJUNOSTOMY TUBE

[Series 300: ir gj tube change · 6 of 6 slices shown]
[im 1/6]
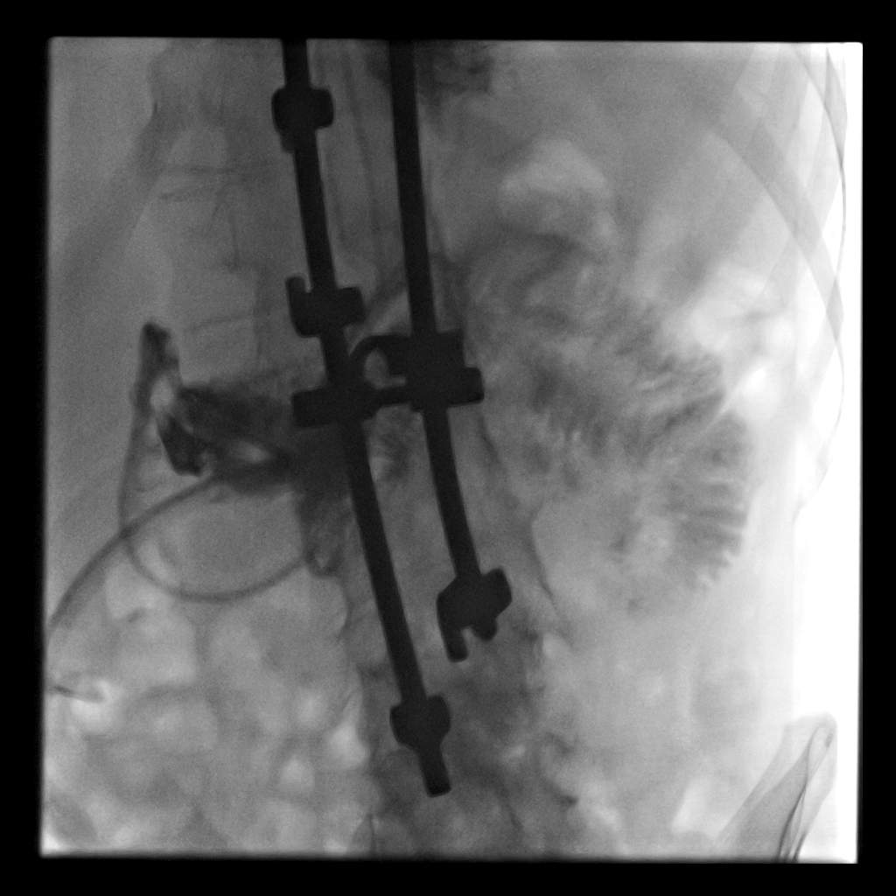
[im 2/6]
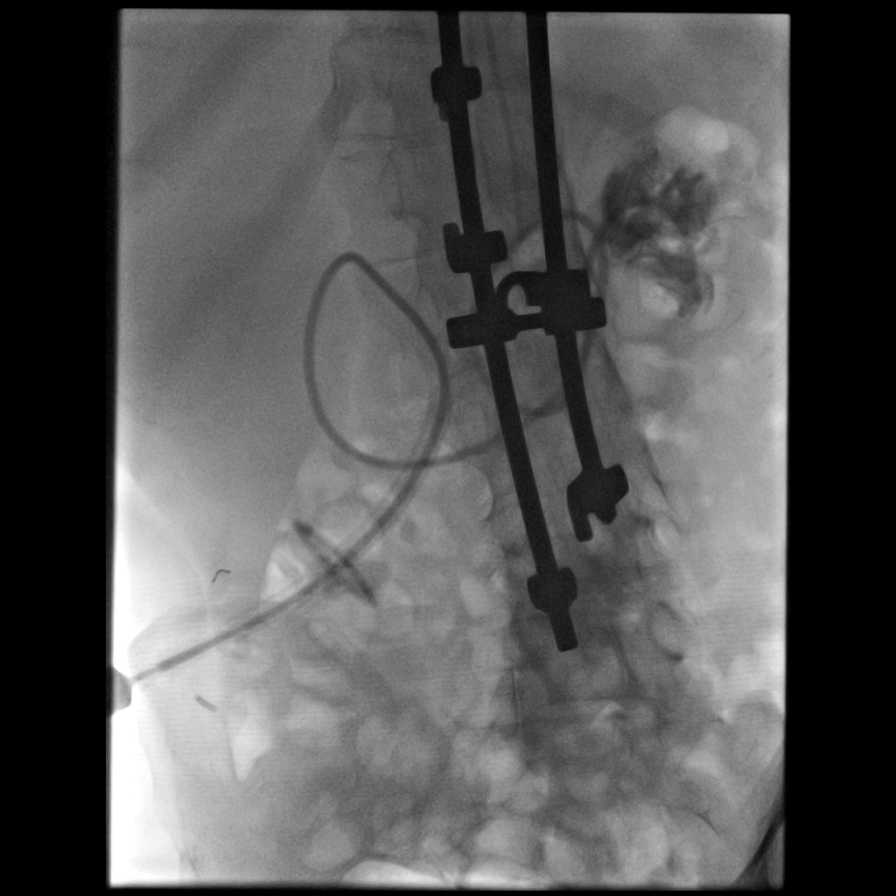
[im 3/6]
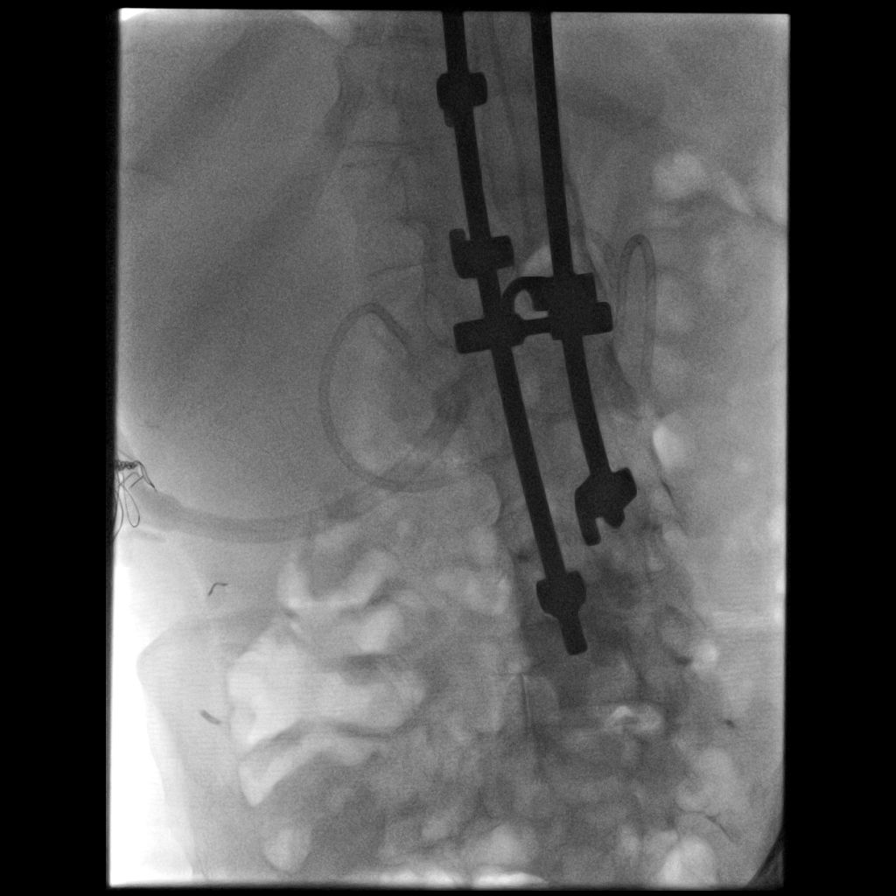
[im 4/6]
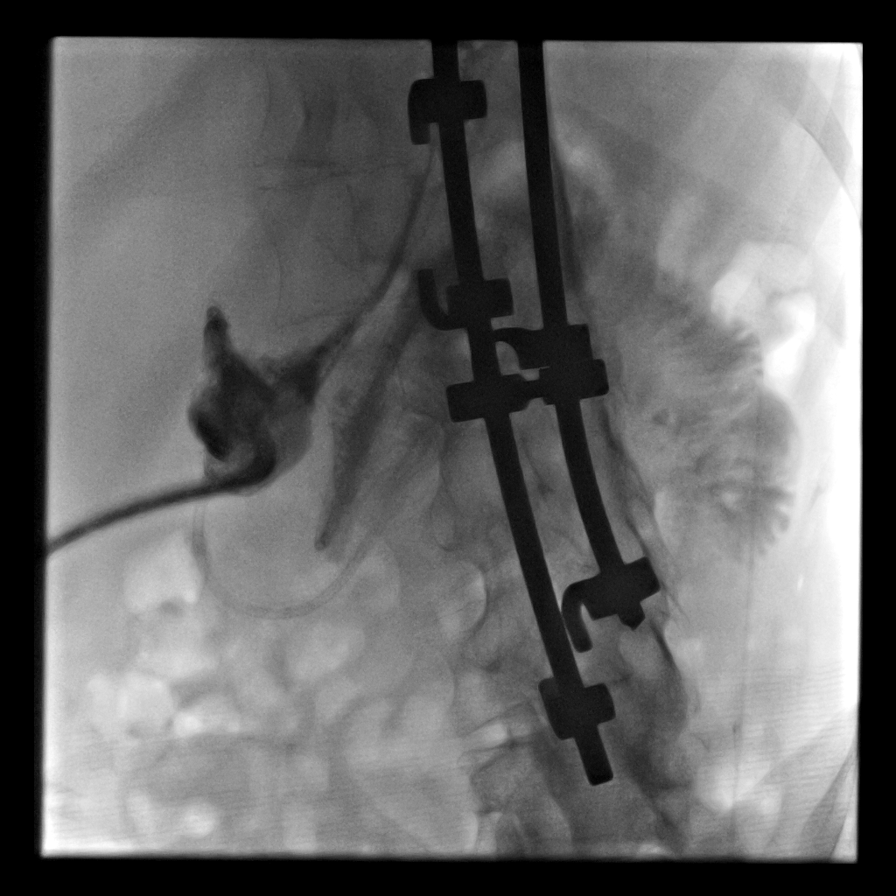
[im 5/6]
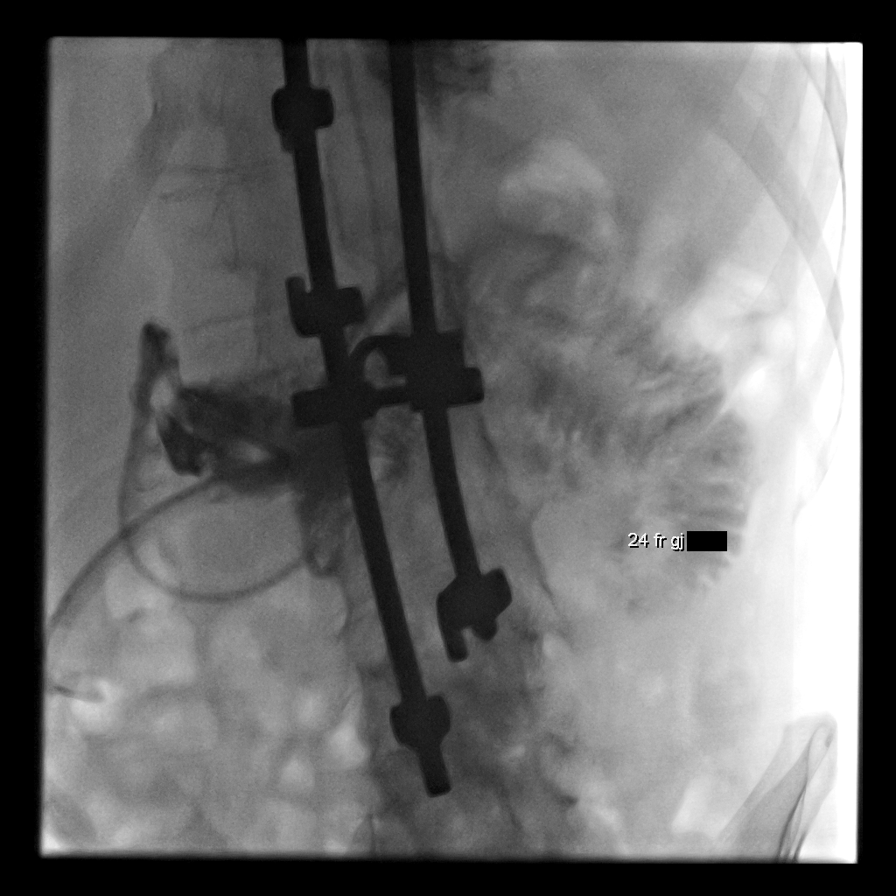
[im 6/6]
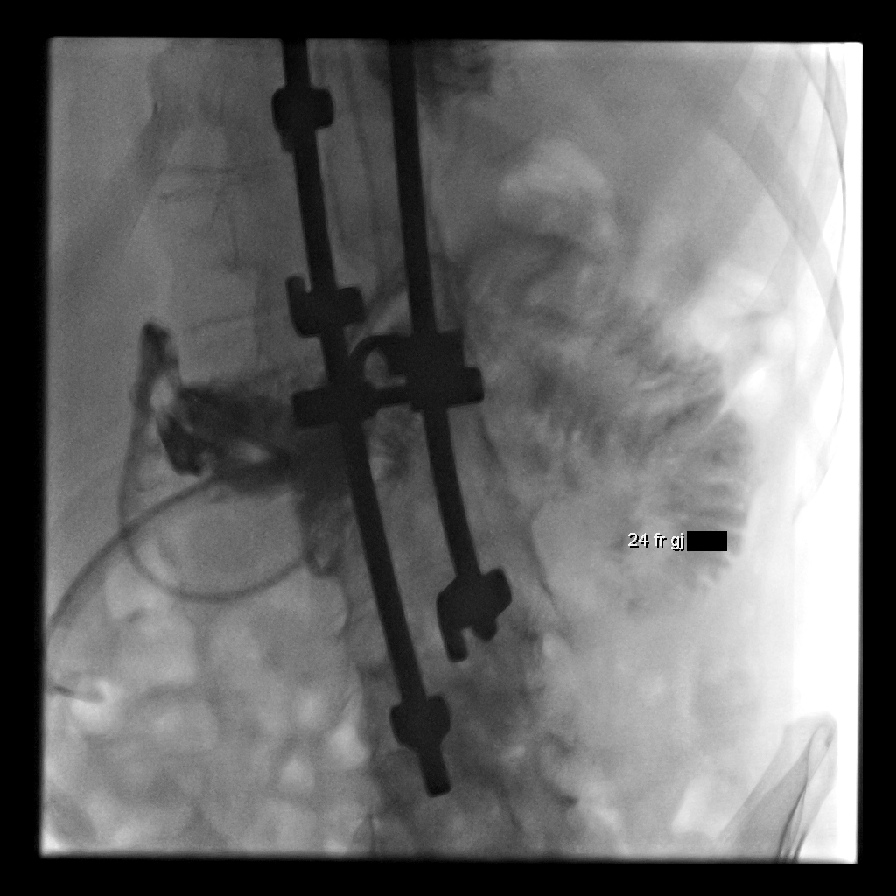

[6 of 6 positions shown; findings below may reference images not displayed]

MEDICATIONS:
None.

CONTRAST:  15 cc Isovue 300, administered into the enteric tract

FLUOROSCOPY TIME:  2 minutes 30 seconds (12 mGy)

COMPLICATIONS:
None.

PROCEDURE:
The upper abdomen and the external portion of the existing
gastrojejunostomy tube was prepped and draped in the usual sterile
fashion, and a sterile drape was applied covering the operative
field. Maximum barrier sterile technique with sterile gowns and
gloves were used for the procedure. A timeout was performed prior to
the initiation of the procedure.

The jejunal limb of the existing 26 French gastrojejunostomy
catheter was cannulated with a stiff Glidewire however was unable to
be advanced to the entirety of the catheter due to catheter
exclusion.

As such, the gastrostomy lumen was cannulated with a stiff glide
wire which was advanced along the track of the jejunostomy catheter
to the level the proximal small bowel. Under intermittent
fluoroscopic guidance, the existing gastrojejunostomy catheter was
exchanged for a new 26 French weighted tip gastrojejunostomy
catheter with tip ultimately terminating within the proximal small
bowel.

The balloon was insufflated with saline and a small amount of dilute
contrast and was pulled against the ventral aspect of the inter wall
of the stomach.

Contrast injection of both of the gastrostomy and jejunostomy lumens
demonstrated appropriate positioning and functionality.

A dressing was placed. The patient tolerated procedure well without
immediate postprocedural complication.
IMPRESSION: Successful fluoroscopic guided replacement of new 26 French
gastrojejunostomy catheter. Both lumens of the feeding tube are
ready for immediate use.

## 2017-02-19 ENCOUNTER — Ambulatory Visit: Payer: Medicare Other | Attending: Family Medicine | Admitting: Physical Therapy

## 2017-02-19 DIAGNOSIS — R293 Abnormal posture: Secondary | ICD-10-CM | POA: Insufficient documentation

## 2017-02-19 DIAGNOSIS — M6281 Muscle weakness (generalized): Secondary | ICD-10-CM | POA: Diagnosis not present

## 2017-02-19 DIAGNOSIS — M6249 Contracture of muscle, multiple sites: Secondary | ICD-10-CM | POA: Diagnosis not present

## 2017-02-19 DIAGNOSIS — G800 Spastic quadriplegic cerebral palsy: Secondary | ICD-10-CM | POA: Diagnosis not present

## 2017-02-19 DIAGNOSIS — R29898 Other symptoms and signs involving the musculoskeletal system: Secondary | ICD-10-CM | POA: Insufficient documentation

## 2017-02-19 NOTE — Therapy (Signed)
[facility] [address] [place], [place], [address] Phone: [phone]   Fax:  [phone]  Physical Therapy Evaluation  Patient Details  Name: [name] MRN: [id number] Date of Birth: June 25, 1986 Referring Provider: Dr. [name]  Encounter Date: 02/19/2017      PT End of Session - 02/21/17 2058    Visit Number 1  G1   Number of Visits 9   Date for PT Re-Evaluation 03/21/17   Authorization Type [employer] Medicare   Authorization Time Period 02-19-17 - 04-20-17   PT Start Time 1320   PT Stop Time 1410   PT Time Calculation (min) 50 min      Past Medical History:  Diagnosis Date  . Cerebral palsy ([place])   . Dehydration 11/22/2013  . Depression with anxiety 08/01/2010   Qualifier: Diagnosis of  By: Nelson-Smith CMA (AAMA), Dottie    . Esophagitis 2011  . Gastrostomy in place [facility]) 08/31/2013  . GERD (gastroesophageal reflux disease)   . Hyperlipidemia, mild 08/25/2015  . Hyperthyroidism   . Incontinence of feces   . Loss of weight 08/28/2014  . Medicare annual wellness visit, subsequent 08/25/2015  . Palpitations     Past Surgical History:  Procedure Laterality Date  . baclofen trial    . baslofen pump implant    . ears tubes    . EYE SURGERY    . FLEXIBLE SIGMOIDOSCOPY N/A 09/07/2014   Procedure: FLEXIBLE SIGMOIDOSCOPY;  Surgeon: [name], MD;  Location: [facility] ENDOSCOPY;  Service: Endoscopy;  Laterality: N/A;  . g-tube insert  August 2006  . hamstring released     to treat contractures.   Marland Kitchen HIP SURGERY     x2 , side   . IR GENERIC HISTORICAL  07/01/2016   IR GASTR TUBE CONVERT GASTR-JEJ PER W/FL MOD SED 07/01/2016 [name], MD [facility]  . IR GENERIC HISTORICAL  07/08/2016   IR PATIENT EVAL TECH 0-60 MINS 07/08/2016 [name], MD [facility]  . IR GENERIC HISTORICAL  07/14/2016   IR GJ TUBE CHANGE 07/14/2016 [name], MD [facility]  . IR GENERIC HISTORICAL  07/21/2016   IR PATIENT  EVAL TECH 0-60 MINS [facility]  . IR GENERIC HISTORICAL  08/31/2016   IR [place] DUODEN/JEJUNO TUBE PERCUT W/FLUORO 08/31/2016 [name], MD [facility]  . IR GENERIC HISTORICAL  09/03/2016   IR GJ TUBE CHANGE 09/03/2016 [name], MD [facility]  . IR GENERIC HISTORICAL  09/10/2016   IR GASTR TUBE CONVERT GASTR-JEJ PER W/FL MOD SED 09/10/2016 [facility]  . IR GENERIC HISTORICAL  09/16/2016   IR PATIENT EVAL TECH 0-60 MINS [facility]  . IR GENERIC HISTORICAL  09/29/2016   IR GJ TUBE CHANGE 09/29/2016 [name], MD [facility]  . IR GJ TUBE CHANGE  02/05/2017  . PEG PLACEMENT  10/21/2011   Procedure: PERCUTANEOUS ENDOSCOPIC GASTROSTOMY (PEG) REPLACEMENT;  Surgeon: [name], MD;  [facility];  Service: Endoscopy;  Laterality: N/A;  . PEG PLACEMENT N/A 06/13/2013   Procedure: PERCUTANEOUS ENDOSCOPIC GASTROSTOMY (PEG) REPLACEMENT;  Surgeon: [name], MD;  Location[facility];  Service: Endoscopy;  Laterality: N/A;  . SPINAL FUSION    . spinal fusion to correct 70 degree kyphosis  11-2010  . spinal fusioncorrect 106 degree kyphosis    . SPINE SURGERY  ,11/20/2010, 2011   for correction of severe contracturing spinal kyphosis.   . TONSILLECTOMY      There were no  vitals filed for this visit.       Subjective Assessment - 02/21/17 2039    Subjective Pt's mother, [name] reports pt began receiving Botox in upper traps in 2013 which is along same time as dysphagia; last Botox injection Jan 2018; appt with Dr. Naaman Plummer is scheduled for 02-23-17; pt is going to massage therapist prn    Patient is accompained by: Family member  caregiver Quillian Quince   Pertinent History CP with spastic quadriplegia;     Patient Stated Goals work on stretching legs; work on sitting balance; stretching for UE's and neck - pt to see Dr. Naaman Plummer on 02-23-17   Currently in Pain? No/denies            [facility] PT Assessment - 02/21/17 0001      Assessment   Medical Diagnosis CP with  spastic quadriplegia   Referring Provider Dr. [name]   Onset Date/Surgical Date --  Congenital     Precautions   Precautions Fall     Balance Screen   Has the patient fallen in the past 6 months No   Has the patient had a decrease in activity level because of a fear of falling?  No   Is the patient reluctant to leave their home because of a fear of falling?  No     Prior Function   Level of Independence Needs assistance with ADLs;Needs assistance with homemaking  pt requires use of power wheelchair for mobility    Comments pt goes to Day Program on Monday and Wed.'s     Transfers   Transfers Squat Pivot Transfers   Squat Pivot Transfers 1: +1 Total assist     Ambulation/Gait   Ambulation/Gait No        PROM bil. UE's:   Rt wrist ext - 45 degrees pass; lt 50 degrees pass extension  Rt elbow ext from 35 -  115 = 80    L elbow ext - 65  rt shoulder flexion 82;   75  rt sahoulder abduction 58 degrees;   Rt shoulder horizontal abduction- 50  Lt shoulder horiz. Abduction -29      PT Education - 02/21/17 2053    Education provided Yes   Education Details cervical stretches to increase Rt lateral flexion   Person(s) Educated Patient;Caregiver(s)   Methods Demonstration   Comprehension Verbalized understanding             PT Long Term Goals - 02/21/17 2116      PT LONG TERM GOAL #1   Title Pt will sit unsupported on side of mat for 3" with CGA with UE support prn.   (TARGET DATE 03-21-17)   Time 4   Period Weeks   Status New     PT LONG TERM GOAL #2   Title Instruct caregiver in HEP for stretching UE's, LE's and lateral cervical flexion.  03-21-17   Time 4   Period Weeks   Status New     PT LONG TERM GOAL #3   Title Pt will subjectively report at least 25% improvement in mobility/functional use of LUE.  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  Pt is dependent for transfers and requires a power wheelchair  for mobility.  Pt also presents with postural assymetry with head laterally flexed to left due to spasticity.                                                                                                                    Rehab Potential Good   PT Frequency 2x / week   PT Duration 4 weeks   PT Treatment/Interventions ADLs/Self Care Home Management;Functional mobility training;Therapeutic activities;Therapeutic exercise;Balance training;Neuromuscular re-education;Patient/family education;Passive range of motion;Manual techniques   PT Next Visit Plan measure PROM of hips and knees   PT Home Exercise Plan stretching for UE's   Recommended Other Services OT for UE stretching may possibly be beneficial - pt is receiving consult for Botox on 02-23-17 (Dr. Naaman Plummer)   Consulted and Agree with Plan of Care Patient;Family member/caregiver   Family Member Consulted mother [name]      Patient will benefit from skilled therapeutic intervention in order to improve the following deficits and impairments:  Decreased balance, Decreased mobility, Decreased strength, Hypomobility, Impaired tone, Impaired UE functional use, Impaired flexibility, Postural dysfunction, Decreased range of motion  Visit Diagnosis: Spastic quadriplegic cerebral palsy (HCC) - Plan: PT plan of care cert/re-cert  Abnormal posture - Plan: PT plan of care cert/re-cert  Muscle weakness (generalized) - Plan: PT plan of care cert/re-cert  Contracture of muscle, multiple sites - Plan: PT plan of care cert/re-cert  Other symptoms and signs involving the musculoskeletal system - Plan: PT plan of care cert/re-cert     Problem List Patient Active Problem List   Diagnosis Date Noted  . Cerebral palsy ([place]) 04/14/2016  . Increased oropharyngeal secretions 04/14/2016  . Medicare annual wellness  visit, subsequent 08/25/2015  . Hyperlipidemia, mild 08/25/2015  . Reflux 12/17/2014  . Rectal bleeding 09/06/2014  . Esophageal dysphagia 09/06/2014  . Dysphagia, pharyngoesophageal phase 09/02/2014  . Impetigo 09/02/2014  . Abnormal thyroid function test 06/10/2014  . Acute nonsuppurative otitis media of right ear 05/13/2014  . Acute bronchitis 02/27/2014  . Protein-calorie malnutrition, severe ([place]) 11/23/2013  . Decreased oral intake 11/22/2013  . Gastrostomy in place [facility]) 08/31/2013  . 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 Cervical dystonia 08/23/2013  . Dysphagia, oropharyngeal phase 08/10/2013  . Low back pain 07/11/2013  . 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 Anemia 12/05/2010  . 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 GASTROSTOMY COMPLICATION 14/97/0263  . Infantile cerebral palsy ([place]) 08/04/2010  . Thyrotoxicosis 08/01/2010  . Depression with anxiety 08/01/2010  . GERD 08/01/2010  . OTHER ACNE 10/16/2009  . PALPITATIONS 02/25/2009  . HIP PAIN, BILATERAL 11/06/2008  . NEVI, MULTIPLE 10/09/2008  . Dysphagia 04/02/2008  . Incontinence of feces 04/02/2008  . Urinary incontinence 04/02/2008  . 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 FACIAL RASH 04/29/2007  . PALSY, INFANTILE CEREBRAL, 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    Alda Lea, PT 02/21/2017, 9:39 PM  [facility] [address] [place], [place], [address] Phone: [phone]   Fax:  [phone]  Name:  [name] MRN: [id number] Date of Birth: [birth year]

## 2017-02-21 ENCOUNTER — Encounter: Payer: Self-pay | Admitting: Physical Therapy

## 2017-02-23 ENCOUNTER — Encounter: Payer: Medicare Other | Attending: Physical Medicine & Rehabilitation | Admitting: Physical Medicine & Rehabilitation

## 2017-02-23 ENCOUNTER — Ambulatory Visit: Payer: Self-pay | Admitting: Physical Medicine & Rehabilitation

## 2017-02-23 ENCOUNTER — Ambulatory Visit: Payer: Medicare Other | Admitting: Physical Therapy

## 2017-02-23 ENCOUNTER — Encounter: Payer: Self-pay | Admitting: Physical Medicine & Rehabilitation

## 2017-02-23 VITALS — BP 109/72 | HR 80 | Resp 14

## 2017-02-23 DIAGNOSIS — G243 Spasmodic torticollis: Secondary | ICD-10-CM

## 2017-02-23 DIAGNOSIS — Z9889 Other specified postprocedural states: Secondary | ICD-10-CM | POA: Insufficient documentation

## 2017-02-23 DIAGNOSIS — G800 Spastic quadriplegic cerebral palsy: Secondary | ICD-10-CM | POA: Insufficient documentation

## 2017-02-23 DIAGNOSIS — F418 Other specified anxiety disorders: Secondary | ICD-10-CM | POA: Insufficient documentation

## 2017-02-23 DIAGNOSIS — K219 Gastro-esophageal reflux disease without esophagitis: Secondary | ICD-10-CM | POA: Insufficient documentation

## 2017-02-23 DIAGNOSIS — G808 Other cerebral palsy: Secondary | ICD-10-CM

## 2017-02-23 NOTE — Patient Instructions (Signed)
PLEASE FEEL FREE TO CALL OUR OFFICE WITH ANY PROBLEMS OR QUESTIONS ([phone])      

## 2017-02-23 NOTE — Progress Notes (Signed)
Subjective:    Patient ID: [name], male    DOB: [birth year], 31 y.o.   MRN: [id number]  HPI   [name] is here in follow up of his CP and associated spastic tetraplegia. I last saw him in January of 2017. He was experiencing a neurological decline and has been largely by Neurology at [facility]. Mom brings [name] back today with concerns over increased spasticity in his upper extremities.  He last had botox at [facility] in January to his bilateral UE's, and bilateral rectus femoris muscles.   He has seen [name] NTZGYF for an initial evaluation at [facility] on 4/20.   Because of his spasticity [name] has daily pain, difficulties with care and basic hygiene. When his spasticity is under better control he has occasionally been able to control the joystick on his wheelchair.   Pain Inventory Average Pain 0 Pain Right Now 0 My pain is intermittent and tightness, spasms  In the last 24 hours, has pain interfered with the following? General activity 0 Relation with others 0 Enjoyment of life 0 What TIME of day is your pain at its worst? no pain Sleep (in general) Good  Pain is worse with: no pain Pain improves with: no pain Relief from Meds: no pain  Mobility how many minutes can you walk? 0 ability to climb steps?  no do you drive?  no use a wheelchair  Function disabled: date disabled . I need assistance with the following:  feeding, dressing, bathing, toileting, meal prep, household duties and shopping  Neuro/Psych bladder control problems bowel control problems spasms  Prior Studies Any changes since last visit?  no  Physicians involved in your care Any changes since last visit?  no   Family History  Problem Relation Age of Onset  . Asthma Mother   . Hyperlipidemia Mother   . COPD Mother   . Other Mother     bronchial stasis/ABPA  . Cancer Maternal Grandmother 62    breast  . Hyperlipidemia Maternal Grandmother   . Hypertension Maternal Grandmother   . Cancer  Maternal Grandfather     prostate  . Heart disease Paternal Grandfather     CHF  . Osteoporosis Paternal Grandmother   . Arthritis Paternal Grandmother     rheumatoid   Social History   Social History  . Marital status: Single    Spouse name: N/A  . Number of children: 0  . Years of education: N/A   Occupational History  . disbaled    Social History Main Topics  . Smoking status: Never Smoker  . Smokeless tobacco: Never Used  . Alcohol use No  . Drug use: No  . Sexual activity: No   Other Topics Concern  . None   Social History Narrative  . None   Past Surgical History:  Procedure Laterality Date  . baclofen trial    . baslofen pump implant    . ears tubes    . EYE SURGERY    . FLEXIBLE SIGMOIDOSCOPY N/A 09/07/2014   Procedure: FLEXIBLE SIGMOIDOSCOPY;  Surgeon: [name], MD;  Location: [facility] ENDOSCOPY;  Service: Endoscopy;  Laterality: N/A;  . g-tube insert  August 2006  . hamstring released     to treat contractures.   Marland Kitchen HIP SURGERY     x2 , side   . IR GENERIC HISTORICAL  07/01/2016   IR GASTR TUBE CONVERT GASTR-JEJ PER W/FL MOD SED 07/01/2016 [name], MD [facility]  . IR GENERIC HISTORICAL  07/08/2016   IR PATIENT EVAL TECH 0-60 MINS 07/08/2016 [name], MD [facility]  . IR GENERIC HISTORICAL  07/14/2016   IR GJ TUBE CHANGE 07/14/2016 [name], MD [facility]  . IR GENERIC HISTORICAL  07/21/2016   IR PATIENT EVAL TECH 0-60 MINS [facility]  . IR GENERIC HISTORICAL  08/31/2016   IR [place] DUODEN/JEJUNO TUBE PERCUT W/FLUORO 08/31/2016 [name], MD [facility]  . IR GENERIC HISTORICAL  09/03/2016   IR GJ TUBE CHANGE 09/03/2016 [name], MD [facility]  . IR GENERIC HISTORICAL  09/10/2016   IR GASTR TUBE CONVERT GASTR-JEJ PER W/FL MOD SED 09/10/2016 [facility]  . IR GENERIC HISTORICAL  09/16/2016   IR PATIENT EVAL TECH 0-60 MINS [facility]  . IR GENERIC HISTORICAL  09/29/2016   IR GJ TUBE CHANGE 09/29/2016 [name], MD  [facility]  . IR GJ TUBE CHANGE  02/05/2017  . PEG PLACEMENT  10/21/2011   Procedure: PERCUTANEOUS ENDOSCOPIC GASTROSTOMY (PEG) REPLACEMENT;  Surgeon: [name], MD;  [facility];  Service: Endoscopy;  Laterality: N/A;  . PEG PLACEMENT N/A 06/13/2013   Procedure: PERCUTANEOUS ENDOSCOPIC GASTROSTOMY (PEG) REPLACEMENT;  Surgeon: [name], MD;  [facility];  Service: Endoscopy;  Laterality: N/A;  . SPINAL FUSION    . spinal fusion to correct 70 degree kyphosis  11-2010  . spinal fusioncorrect 106 degree kyphosis    . SPINE SURGERY  ,11/20/2010, 2011   for correction of severe contracturing spinal kyphosis.   . TONSILLECTOMY     Past Medical History:  Diagnosis Date  . Cerebral palsy ([place])   . Dehydration 11/22/2013  . Depression with anxiety 08/01/2010   Qualifier: Diagnosis of  By: Nelson-Smith CMA (AAMA), Dottie    . Esophagitis 2011  . Gastrostomy in place [facility]) 08/31/2013  . GERD (gastroesophageal reflux disease)   . Hyperlipidemia, mild 08/25/2015  . Hyperthyroidism   . Incontinence of feces   . Loss of weight 08/28/2014  . Medicare annual wellness visit, subsequent 08/25/2015  . Palpitations    BP 109/72 (BP Location: Right Arm, Patient Position: Sitting, Cuff Size: Normal)   Pulse 80   Resp 14   SpO2 96%   Opioid Risk Score:   Fall Risk Score:  `1  Depression screen PHQ 2/9  No flowsheet data found.   Review of Systems  Constitutional: Negative.   HENT: Negative.   Eyes: Negative.   Respiratory: Positive for cough.   Cardiovascular: Negative.   Gastrointestinal:       Neurogenic bowel  Endocrine: Negative.   Genitourinary: Positive for difficulty urinating.       Neurogenic bladder  Musculoskeletal:       Spasms  Skin: Negative.   Allergic/Immunologic: Negative.   Neurological: Negative.   Hematological: Negative.   Psychiatric/Behavioral: Negative.   All other systems reviewed and are negative.      Objective:   Physical  Exam   General: Alert and oriented x 3, No apparent distress HEENT: Head is normocephalic, atraumatic, PERRLA, EOMI, sclera anicteric, oral mucosa pink and moist, dentition intact, ext ear canals clear,  Neck: Supple without JVD or lymphadenopathy Heart: RRR Chest: CTA, occasional cough Abdomen: Soft, non-tender, non-distended, bowel sounds positive. Extremities: No clubbing, cyanosis, or edema. Pulses are 2+ Skin: Clean and intact without signs of breakdown Neuro: [name] has reasonable insight and awareness. His speech remains largely unintellgibile. He has some simple movement in all four limbs but is limited by tone. He has  ongoing tightness in his left SCM although head is more neutral than in past. Biceps are 2/4 bilaterally. Wrist and finger flexors/lumbricals are 3-4/4 with wrists both pronated.. Hamstrings and quads are 2/4 bilaterally. 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 He does sense pain in all 4 limbs.  Musculoskeletal: Full ROM, No pain with AROM or PROM in the neck, trunk, or extremities. Posture appropriate Psych: Pt's affect is appropriate. Pt is cooperative        Assessment & Plan:  1. 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 Cervical Dystonia   Plan:  1. Will arrange for botox 600 units to bilateral wrist and finger flexors. Also consider elbow flexors as well, but as I described to mother, the more we stretch this, the less effective it will be for his severe tone 2. Continue with outpt PT at neuro-rehab. Made a referral as well to outpt OT to coincide with botox injections.  3. Will see him back in about 6 weeks for injections.

## 2017-02-26 ENCOUNTER — Ambulatory Visit: Payer: Medicare Other | Admitting: Physical Therapy

## 2017-02-26 ENCOUNTER — Encounter: Payer: Self-pay | Admitting: Physical Therapy

## 2017-02-26 DIAGNOSIS — R29898 Other symptoms and signs involving the musculoskeletal system: Secondary | ICD-10-CM

## 2017-02-26 DIAGNOSIS — M6249 Contracture of muscle, multiple sites: Secondary | ICD-10-CM

## 2017-02-26 DIAGNOSIS — R293 Abnormal posture: Secondary | ICD-10-CM

## 2017-02-26 DIAGNOSIS — M6281 Muscle weakness (generalized): Secondary | ICD-10-CM

## 2017-02-26 DIAGNOSIS — G800 Spastic quadriplegic cerebral palsy: Secondary | ICD-10-CM | POA: Diagnosis not present

## 2017-02-26 NOTE — Therapy (Signed)
[facility] [address] [place], [place], [address] Phone: [phone]   Fax:  [phone]  Physical Therapy Treatment  Patient Details  Name: [name] MRN: [id number] Date of Birth: December 20, 1985 Referring Provider: Dr. [name]  Encounter Date: 02/26/2017      PT End of Session - 02/26/17 1312    Visit Number 2  G1   Number of Visits 9   Date for PT Re-Evaluation 03/21/17   Authorization Type [facility] Medicare   Authorization Time Period 02-19-17 - 04-20-17   PT Start Time 1152  arrived late   PT Stop Time 1233   PT Time Calculation (min) 41 min   Activity Tolerance Patient tolerated treatment well  smiling throughout      Past Medical History:  Diagnosis Date  . Cerebral palsy ([place])   . Dehydration 11/22/2013  . Depression with anxiety 08/01/2010   Qualifier: Diagnosis of  By: Nelson-Smith CMA (AAMA), Dottie    . Esophagitis 2011  . Gastrostomy in place [facility]) 08/31/2013  . GERD (gastroesophageal reflux disease)   . Hyperlipidemia, mild 08/25/2015  . Hyperthyroidism   . Incontinence of feces   . Loss of weight 08/28/2014  . Medicare annual wellness visit, subsequent 08/25/2015  . Palpitations     Past Surgical History:  Procedure Laterality Date  . baclofen trial    . baslofen pump implant    . ears tubes    . EYE SURGERY    . FLEXIBLE SIGMOIDOSCOPY N/A 09/07/2014   Procedure: FLEXIBLE SIGMOIDOSCOPY;  Surgeon: [name], MD;  Location: [facility] ENDOSCOPY;  Service: Endoscopy;  Laterality: N/A;  . g-tube insert  August 2006  . hamstring released     to treat contractures.   Marland Kitchen HIP SURGERY     x2 , side   . IR GENERIC HISTORICAL  07/01/2016   IR GASTR TUBE CONVERT GASTR-JEJ PER W/FL MOD SED 07/01/2016 [name], MD [facility]  . IR GENERIC HISTORICAL  07/08/2016   IR PATIENT EVAL TECH 0-60 MINS 07/08/2016 [name], MD [facility]  . IR GENERIC HISTORICAL  07/14/2016   IR GJ TUBE CHANGE  07/14/2016 [name], MD [facility]  . IR GENERIC HISTORICAL  07/21/2016   IR PATIENT EVAL TECH 0-60 MINS [facility]  . IR GENERIC HISTORICAL  08/31/2016   IR [place] DUODEN/JEJUNO TUBE PERCUT W/FLUORO 08/31/2016 [name], MD [facility]  . IR GENERIC HISTORICAL  09/03/2016   IR GJ TUBE CHANGE 09/03/2016 [name], MD [facility]  . IR GENERIC HISTORICAL  09/10/2016   IR GASTR TUBE CONVERT GASTR-JEJ PER W/FL MOD SED 09/10/2016 [facility]  . IR GENERIC HISTORICAL  09/16/2016   IR PATIENT EVAL TECH 0-60 MINS [facility]  . IR GENERIC HISTORICAL  09/29/2016   IR GJ TUBE CHANGE 09/29/2016 [name], MD [facility]  . IR GJ TUBE CHANGE  02/05/2017  . PEG PLACEMENT  10/21/2011   Procedure: PERCUTANEOUS ENDOSCOPIC GASTROSTOMY (PEG) REPLACEMENT;  Surgeon: [name], MD;  [facility];  Service: Endoscopy;  Laterality: N/A;  . PEG PLACEMENT N/A 06/13/2013   Procedure: PERCUTANEOUS ENDOSCOPIC GASTROSTOMY (PEG) REPLACEMENT;  Surgeon: [name], MD;  [facility] ENDOSCOPY;  Service: Endoscopy;  Laterality: N/A;  . SPINAL FUSION    . spinal fusion to correct 70 degree kyphosis  11-2010  . spinal fusioncorrect 106 degree kyphosis    . SPINE SURGERY  ,11/20/2010, 2011   for correction of severe contracturing  spinal kyphosis.   . TONSILLECTOMY      There were no vitals filed for this visit.      Subjective Assessment - 02/26/17 1154    Subjective Nothing new to report per caregiver Quillian Quince)   Patient is accompained by: --  caregiver Quillian Quince   Pertinent History CP with spastic quadriplegia;     Patient Stated Goals work on stretching legs; work on sitting balance; stretching for UE's and neck - pt to see Dr. Naaman Plummer on 02-23-17   Currently in Pain? No/denies            [facility] PT Assessment - 02/26/17 1203      ROM / Strength   AROM / PROM / Strength PROM     PROM   Overall PROM  Deficits   PROM Assessment Site Hip;Knee;Ankle   Right/Left Hip Left;Right    Right Hip External Rotation  19   Right Hip Internal Rotation  0   Left Hip Extension -10   Left Hip Flexion 85   Left Hip External Rotation  125   Left Hip Internal Rotation  9   Left Hip ABduction 13   Left Hip ADduction 25   Right/Left Knee Left   Right Knee Extension -24   Right Knee Flexion 118   Left Knee Extension 20   Left Knee Flexion 108   Right/Left Ankle Left   Right Ankle Dorsiflexion 27   Right Ankle Plantar Flexion 35   Left Ankle Dorsiflexion 32   Left Ankle Plantar Flexion 62     Right Hip   Right Hip Extension -12   Right Hip Flexion 105   Right Hip ABduction 28   Right Hip ADduction 18                     [facility] Adult PT Treatment/Exercise - 02/26/17 0001      Exercises   Exercises Other Exercises   Other Exercises  PROM 5- 10 reps of bil hip, knee, ankle in each plane of movement prior to taking PROM measurements.                 PT Education - 02/26/17 1311    Education provided Yes   Education Details Quillian Quince interested in how to stretch [name]'s legs and educated throughout session   Person(s) Educated Patient;Caregiver(s)   Methods Explanation;Demonstration   Comprehension Verbalized understanding             PT Long Term Goals - 02/21/17 2116      PT LONG TERM GOAL #1   Title Pt will sit unsupported on side of mat for 3" with CGA with UE support prn.   (TARGET DATE 03-21-17)   Time 4   Period Weeks   Status New     PT LONG TERM GOAL #2   Title Instruct caregiver in HEP for stretching UE's, LE's and lateral cervical flexion.  03-21-17   Time 4   Period Weeks   Status New     PT LONG TERM GOAL #3   Title Pt will subjectively report at least 25% improvement in mobility/functional use of LUE.  03-21-17   Time 4   Period Weeks   Status New               Plan - 02/26/17 1314    Clinical Impression Statement Session focused on PROM/stretching of bil LEs and obtaining baseline measurements. Quillian Quince,  caregiver very attentive.   Rehab Potential Good  PT Frequency 2x / week   PT Duration 4 weeks   PT Treatment/Interventions ADLs/Self Care Home Management;Functional mobility training;Therapeutic activities;Therapeutic exercise;Balance training;Neuromuscular re-education;Patient/family education;Passive range of motion;Manual techniques   PT Next Visit Plan ? work on sitting balance vs educating caregiver (? handouts) for PROM    PT Home Exercise Plan stretching for UE's   Consulted and Agree with Plan of Care Patient;Family member/caregiver   Family Member Consulted mother [name]      Patient will benefit from skilled therapeutic intervention in order to improve the following deficits and impairments:  Decreased balance, Decreased mobility, Decreased strength, Hypomobility, Impaired tone, Impaired UE functional use, Impaired flexibility, Postural dysfunction, Decreased range of motion  Visit Diagnosis: Abnormal posture  Muscle weakness (generalized)  Contracture of muscle, multiple sites  Other symptoms and signs involving the musculoskeletal system     Problem List Patient Active Problem List   Diagnosis Date Noted  . Cerebral palsy ([place]) 04/14/2016  . Increased oropharyngeal secretions 04/14/2016  . Medicare annual wellness visit, subsequent 08/25/2015  . Hyperlipidemia, mild 08/25/2015  . Reflux 12/17/2014  . Rectal bleeding 09/06/2014  . Esophageal dysphagia 09/06/2014  . Dysphagia, pharyngoesophageal phase 09/02/2014  . Impetigo 09/02/2014  . Abnormal thyroid function test 06/10/2014  . Acute nonsuppurative otitis media of right ear 05/13/2014  . Acute bronchitis 02/27/2014  . Protein-calorie malnutrition, severe ([place]) 11/23/2013  . Decreased oral intake 11/22/2013  . Gastrostomy in place [facility]) 08/31/2013  . 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 Cervical dystonia 08/23/2013  . Dysphagia, oropharyngeal phase 08/10/2013  . Low back pain 07/11/2013  . 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 Anemia 12/05/2010  . 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 GASTROSTOMY COMPLICATION 44/11/270  . Infantile cerebral palsy ([place]) 08/04/2010  . Thyrotoxicosis 08/01/2010  . Depression with anxiety 08/01/2010  . GERD 08/01/2010  . OTHER ACNE 10/16/2009  . PALPITATIONS 02/25/2009  . HIP PAIN, BILATERAL 11/06/2008  . NEVI, MULTIPLE 10/09/2008  . Dysphagia 04/02/2008  . Incontinence of feces 04/02/2008  . Urinary incontinence 04/02/2008  . 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 FACIAL RASH 04/29/2007  . PALSY, INFANTILE CEREBRAL, 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    Rexanne Mano, PT 02/26/2017, 1:18 PM  [facility] [address] [place], [place], [address] Phone: [phone]   Fax:  [phone]  Name: [name] MRN: [id number] Date of Birth: [birth year]

## 2017-03-02 ENCOUNTER — Encounter: Payer: Self-pay | Admitting: Physical Therapy

## 2017-03-02 ENCOUNTER — Encounter: Payer: Self-pay | Admitting: Physical Medicine & Rehabilitation

## 2017-03-02 ENCOUNTER — Ambulatory Visit: Payer: Self-pay | Admitting: Family Medicine

## 2017-03-02 ENCOUNTER — Ambulatory Visit: Payer: Medicare Other | Attending: Family Medicine | Admitting: Physical Therapy

## 2017-03-02 ENCOUNTER — Encounter: Payer: Medicare Other | Attending: Physical Medicine & Rehabilitation | Admitting: Physical Medicine & Rehabilitation

## 2017-03-02 VITALS — BP 116/76 | HR 85

## 2017-03-02 DIAGNOSIS — G800 Spastic quadriplegic cerebral palsy: Secondary | ICD-10-CM | POA: Diagnosis not present

## 2017-03-02 DIAGNOSIS — K219 Gastro-esophageal reflux disease without esophagitis: Secondary | ICD-10-CM | POA: Diagnosis not present

## 2017-03-02 DIAGNOSIS — R29898 Other symptoms and signs involving the musculoskeletal system: Secondary | ICD-10-CM | POA: Insufficient documentation

## 2017-03-02 DIAGNOSIS — M6281 Muscle weakness (generalized): Secondary | ICD-10-CM | POA: Diagnosis not present

## 2017-03-02 DIAGNOSIS — R293 Abnormal posture: Secondary | ICD-10-CM | POA: Diagnosis not present

## 2017-03-02 DIAGNOSIS — F418 Other specified anxiety disorders: Secondary | ICD-10-CM | POA: Diagnosis not present

## 2017-03-02 DIAGNOSIS — G8250 Quadriplegia, unspecified: Secondary | ICD-10-CM

## 2017-03-02 DIAGNOSIS — Z9889 Other specified postprocedural states: Secondary | ICD-10-CM | POA: Insufficient documentation

## 2017-03-02 DIAGNOSIS — M6249 Contracture of muscle, multiple sites: Secondary | ICD-10-CM | POA: Diagnosis not present

## 2017-03-02 NOTE — Patient Instructions (Signed)
PLEASE FEEL FREE TO CALL OUR OFFICE WITH ANY PROBLEMS OR QUESTIONS ([phone])      

## 2017-03-02 NOTE — Progress Notes (Signed)
Botox Injection for spasticity using needle EMG guidance Indication: Spastic tetraplegia (HCC)  Spastic quadriplegic cerebral palsy (HCC)   Dilution: 100 Units/ml        Total Units Injected: 600 Indication: Severe spasticity which interferes with ADL,mobility and/or  hygiene and is unresponsive to medication management and other conservative care Informed consent was obtained after describing risks and benefits of the procedure with the patient. This includes bleeding, bruising, infection, excessive weakness, or medication side effects. A REMS form is on file and signed.  Needle: 67mm injectable monopolar needle electrode  Number of units per muscle Pectoralis Major 0 units Pectoralis Minor 0 units Biceps 0 units Brachioradialis 0 units FCR 25 units left, 25 units right FCU 25 units left, 25 units right.  FDS 100 units, 75 units right FDP 100 units, 75 units right FPL 0 units Pronator Teres 0 units Pronator Quadratus 0 units Lumbricals 1,2 on right 50 units, Lumbricals 1,2,3,4 100 units on left  All injections were done after obtaining appropriate EMG activity and after negative drawback for blood. The patient tolerated the procedure well. Post procedure instructions were given. Return in about 2 months (around 05/02/2017).

## 2017-03-02 NOTE — Therapy (Signed)
[facility] [address] [place], [place], [address] Phone: [phone]   Fax:  [phone]  Physical Therapy Treatment  Patient Details  Name: [name] MRN: [id number] Date of Birth: 1985/11/04 Referring Provider: Dr. [name]  Encounter Date: 03/02/2017      PT End of Session - 03/02/17 1745    Visit Number 3  G3   Number of Visits 9   Date for PT Re-Evaluation 03/21/17   Authorization Type [employer] Medicare   Authorization Time Period 02-19-17 - 04-20-17   PT Start Time 1316   PT Stop Time 1402   PT Time Calculation (min) 46 min   Activity Tolerance Patient tolerated treatment well  smiling throughout   Behavior During Therapy [facility] for tasks assessed/performed      Past Medical History:  Diagnosis Date  . Cerebral palsy ([place])   . Dehydration 11/22/2013  . Depression with anxiety 08/01/2010   Qualifier: Diagnosis of  By: Nelson-Smith CMA (AAMA), Dottie    . Esophagitis 2011  . Gastrostomy in place [facility]) 08/31/2013  . GERD (gastroesophageal reflux disease)   . Hyperlipidemia, mild 08/25/2015  . Hyperthyroidism   . Incontinence of feces   . Loss of weight 08/28/2014  . Medicare annual wellness visit, subsequent 08/25/2015  . Palpitations     Past Surgical History:  Procedure Laterality Date  . baclofen trial    . baslofen pump implant    . ears tubes    . EYE SURGERY    . FLEXIBLE SIGMOIDOSCOPY N/A 09/07/2014   Procedure: FLEXIBLE SIGMOIDOSCOPY;  Surgeon: [name], MD;  Location: [facility] ENDOSCOPY;  Service: Endoscopy;  Laterality: N/A;  . g-tube insert  August 2006  . hamstring released     to treat contractures.   Marland Kitchen HIP SURGERY     x2 , side   . IR GENERIC HISTORICAL  07/01/2016   IR GASTR TUBE CONVERT GASTR-JEJ PER W/FL MOD SED 07/01/2016 [name], MD [facility]  . IR GENERIC HISTORICAL  07/08/2016   IR PATIENT EVAL TECH 0-60 MINS 07/08/2016 [name], MD [facility]  . IR GENERIC  HISTORICAL  07/14/2016   IR GJ TUBE CHANGE 07/14/2016 [name], MD [facility]  . IR GENERIC HISTORICAL  07/21/2016   IR PATIENT EVAL TECH 0-60 MINS [facility]  . IR GENERIC HISTORICAL  08/31/2016   IR [place] DUODEN/JEJUNO TUBE PERCUT W/FLUORO 08/31/2016 [name], MD [facility]  . IR GENERIC HISTORICAL  09/03/2016   IR GJ TUBE CHANGE 09/03/2016 [name], MD [facility]  . IR GENERIC HISTORICAL  09/10/2016   IR GASTR TUBE CONVERT GASTR-JEJ PER W/FL MOD SED 09/10/2016 [facility]  . IR GENERIC HISTORICAL  09/16/2016   IR PATIENT EVAL TECH 0-60 MINS [facility]  . IR GENERIC HISTORICAL  09/29/2016   IR GJ TUBE CHANGE 09/29/2016 [name], MD [facility]  . IR GJ TUBE CHANGE  02/05/2017  . PEG PLACEMENT  10/21/2011   Procedure: PERCUTANEOUS ENDOSCOPIC GASTROSTOMY (PEG) REPLACEMENT;  Surgeon: [name], MD;  [facility] ENDOS[facility];  Service: Endoscopy;  Laterality: N/A;  . PEG PLACEMENT N/A 06/13/2013   Procedure: PERCUTANEOUS ENDOSCOPIC GASTROSTOMY (PEG) REPLACEMENT;  Surgeon: [name], MD;  [facility]OS[facility];  Service: Endoscopy;  Laterality: N/A;  . SPINAL FUSION    . spinal fusion to correct 70 degree kyphosis  11-2010  . spinal fusioncorrect 106 degree kyphosis    . [place]  ,11/20/2010, 2011  for correction of severe contracturing spinal kyphosis.   . TONSILLECTOMY      There were no vitals filed for this visit.      Subjective Assessment - 03/02/17 1317    Subjective Patient had botox injections today for bil UEs and hands. Caregiver reports pt's ADLs are completed in supine due to requires total assist to sit EOB. She sometimes sits pt EOB just for the activity, but she does all the work.    Patient is accompained by: --  caregiver Quillian Quince, and male caregiver   Pertinent History CP with spastic quadriplegia;     Patient Stated Goals work on stretching legs; work on sitting balance; stretching for UE's and neck - pt to see Dr. Naaman Plummer on  02-23-17                         [facility] Adult PT Treatment/Exercise - 03/02/17 1741      Transfers   Comments caregiver Quillian Quince lifts pt into/out of w/c     Exercises   Other Exercises  PROM with caregiver education to bil shoulders (abdct only when in external rotation and at most to 90*), elbows, minimal/gentle wrist ROM, gentle finger PROM; bil hips (including prolonged supine stretch for hip extension and knee extension with bolster/pillow under lower leg--not his heels)                PT Education - 03/02/17 1743    Education provided Yes   Education Details to both caregivers re: techniques to assist with ROM of UEs (after shower, when warm; when drowsy; joint protection education for fingers, wrist and shoulder)   Person(s) Educated Patient;Caregiver(s)   Methods Explanation;Demonstration;Handout  handout for LEs   Comprehension Verbalized understanding;Returned demonstration             PT Long Term Goals - 03/02/17 1754      PT LONG TERM GOAL #1   Title Pt will sit unsupported on side of mat for 3" with CGA with UE support prn.   (TARGET DATE 03-21-17)   Baseline 03/02/17 discontinued based on conversation with caregivers; pt is total assist, cannot functionally use his UEs for support; they already do EOB as an activity, however is not functional for him   Time 4   Period Weeks   Status Deferred     PT LONG TERM GOAL #2   Title Instruct caregiver in HEP for stretching UE's, LE's and lateral cervical flexion.  03-21-17   Baseline 5/1 Reviewed shoulder, elbow, wrist, fingers, hips, knees, hamstrings   Time 4   Period Weeks   Status On-going     PT LONG TERM GOAL #3   Title Pt will subjectively report at least 25% improvement in mobility/functional use of LUE.  03-21-17   Time 4   Period Weeks   Status On-going               Plan - 03/02/17 1746    Clinical Impression Statement Session focused on caregiver education re: UE and LE  PROM. Male and male caregivers present. They report it has been >4 yrs since [place] had splints for his hands/wrists and they are unsure why they were stopped. Discussed potential role for OT now that pt is s/p botox. Caregivers expressed concern re: 1.5 hours of therapies (2 45 minute sessions if both PT and OT seen). Were open to OT once PT concluded, however we would need to discuss with pt's family.  Goals updated after discussion with caregivers re: sitting balance. Will continue to educate on PROM (with potential for gains now s/p botox).    Rehab Potential Good   PT Frequency 2x / week   PT Duration 4 weeks   PT Treatment/Interventions ADLs/Self Care Home Management;Functional mobility training;Therapeutic activities;Therapeutic exercise;Balance training;Neuromuscular re-education;Patient/family education;Passive range of motion;Manual techniques   PT Next Visit Plan ROM of UEs (s/p botox 5/1--can take 10-14 days for most effectiveness); sitting EOB goal d/c'd; discuss with an OT if at all approp for splinting (has now had botox) and consider transition to OT--or if no splinting, just finish out PT)    PT Home Exercise Plan stretching for UE's   Consulted and Agree with Plan of Care Patient;Family member/caregiver   Family Member Consulted caregivers, Quillian Quince and a male      Patient will benefit from skilled therapeutic intervention in order to improve the following deficits and impairments:  Decreased balance, Decreased mobility, Decreased strength, Hypomobility, Impaired tone, Impaired UE functional use, Impaired flexibility, Postural dysfunction, Decreased range of motion  Visit Diagnosis: Abnormal posture  Muscle weakness (generalized)  Contracture of muscle, multiple sites     Problem List Patient Active Problem List   Diagnosis Date Noted  . Cerebral palsy ([place]) 04/14/2016  . Increased oropharyngeal secretions 04/14/2016  . Medicare annual wellness visit, subsequent  08/25/2015  . Hyperlipidemia, mild 08/25/2015  . Reflux 12/17/2014  . Rectal bleeding 09/06/2014  . Esophageal dysphagia 09/06/2014  . Dysphagia, pharyngoesophageal phase 09/02/2014  . Impetigo 09/02/2014  . Abnormal thyroid function test 06/10/2014  . Acute nonsuppurative otitis media of right ear 05/13/2014  . Acute bronchitis 02/27/2014  . Protein-calorie malnutrition, severe ([place]) 11/23/2013  . Decreased oral intake 11/22/2013  . Gastrostomy in place [facility]) 08/31/2013  . 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 Cervical dystonia 08/23/2013  . Dysphagia, oropharyngeal phase 08/10/2013  . Low back pain 07/11/2013  . 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 Anemia 12/05/2010  . 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 GASTROSTOMY COMPLICATION 56/70/1410  . Infantile cerebral palsy ([place]) 08/04/2010  . Thyrotoxicosis 08/01/2010  . Depression with anxiety 08/01/2010  . GERD 08/01/2010  . OTHER ACNE 10/16/2009  . PALPITATIONS 02/25/2009  . HIP PAIN, BILATERAL 11/06/2008  . NEVI, MULTIPLE 10/09/2008  . Dysphagia 04/02/2008  . Incontinence of feces 04/02/2008  . Urinary incontinence 04/02/2008  . 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 FACIAL RASH 04/29/2007  . PALSY, INFANTILE CEREBRAL, 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    Rexanne Mano, PT 03/02/2017, 5:58 PM  [facility] [address] [place], [place], [address] Phone: [phone]   Fax:  [phone]  Name: LEVERETT CAMPLIN MRN: [id number] Date of Birth: 12-02-1985

## 2017-03-09 ENCOUNTER — Ambulatory Visit: Payer: Medicare Other | Admitting: Physical Therapy

## 2017-03-09 DIAGNOSIS — M6249 Contracture of muscle, multiple sites: Secondary | ICD-10-CM | POA: Diagnosis not present

## 2017-03-09 DIAGNOSIS — R293 Abnormal posture: Secondary | ICD-10-CM | POA: Diagnosis not present

## 2017-03-09 DIAGNOSIS — M6281 Muscle weakness (generalized): Secondary | ICD-10-CM

## 2017-03-09 DIAGNOSIS — R29898 Other symptoms and signs involving the musculoskeletal system: Secondary | ICD-10-CM | POA: Diagnosis not present

## 2017-03-09 DIAGNOSIS — L708 Other acne: Secondary | ICD-10-CM | POA: Diagnosis not present

## 2017-03-10 DIAGNOSIS — N3941 Urge incontinence: Secondary | ICD-10-CM | POA: Diagnosis not present

## 2017-03-10 DIAGNOSIS — N1330 Unspecified hydronephrosis: Secondary | ICD-10-CM | POA: Diagnosis not present

## 2017-03-10 NOTE — Therapy (Signed)
[facility] [address] [place], [place], [address] Phone: [phone]   Fax:  [phone]  Physical Therapy Treatment  Patient Details  Name: [name] MRN: [id number] Date of Birth: Jan 16, 1986 Referring Provider: Dr. [name]  Encounter Date: 03/09/2017      PT End of Session - 03/10/17 2200    Visit Number 4   Number of Visits 9   Date for PT Re-Evaluation 03/21/17   Authorization Type [employer] Medicare   Authorization Time Period 02-19-17 - 04-20-17   PT Start Time 1147   PT Stop Time 1244   PT Time Calculation (min) 57 min      Past Medical History:  Diagnosis Date  . Cerebral palsy ([place])   . Dehydration 11/22/2013  . Depression with anxiety 08/01/2010   Qualifier: Diagnosis of  By: Nelson-Smith CMA (AAMA), Dottie    . Esophagitis 2011  . Gastrostomy in place [facility]) 08/31/2013  . GERD (gastroesophageal reflux disease)   . Hyperlipidemia, mild 08/25/2015  . Hyperthyroidism   . Incontinence of feces   . Loss of weight 08/28/2014  . Medicare annual wellness visit, subsequent 08/25/2015  . Palpitations     Past Surgical History:  Procedure Laterality Date  . baclofen trial    . baslofen pump implant    . ears tubes    . EYE SURGERY    . FLEXIBLE SIGMOIDOSCOPY N/A 09/07/2014   Procedure: FLEXIBLE SIGMOIDOSCOPY;  Surgeon: [name], MD;  Location: [facility] ENDOSCOPY;  Service: Endoscopy;  Laterality: N/A;  . g-tube insert  August 2006  . hamstring released     to treat contractures.   Marland Kitchen HIP SURGERY     x2 , side   . IR GENERIC HISTORICAL  07/01/2016   IR GASTR TUBE CONVERT GASTR-JEJ PER W/FL MOD SED 07/01/2016 [name], MD [facility]  . IR GENERIC HISTORICAL  07/08/2016   IR PATIENT EVAL TECH 0-60 MINS 07/08/2016 [name], MD [facility]  . IR GENERIC HISTORICAL  07/14/2016   IR GJ TUBE CHANGE 07/14/2016 [name], MD [facility]  . IR GENERIC HISTORICAL  07/21/2016   IR PATIENT EVAL  TECH 0-60 MINS [facility]  . IR GENERIC HISTORICAL  08/31/2016   IR [place] DUODEN/JEJUNO TUBE PERCUT W/FLUORO 08/31/2016 [name], MD [facility]  . IR GENERIC HISTORICAL  09/03/2016   IR GJ TUBE CHANGE 09/03/2016 [name], MD [facility]  . IR GENERIC HISTORICAL  09/10/2016   IR GASTR TUBE CONVERT GASTR-JEJ PER W/FL MOD SED 09/10/2016 [facility]  . IR GENERIC HISTORICAL  09/16/2016   IR PATIENT EVAL TECH 0-60 MINS [facility]  . IR GENERIC HISTORICAL  09/29/2016   IR GJ TUBE CHANGE 09/29/2016 [name], MD [facility]  . IR GJ TUBE CHANGE  02/05/2017  . PEG PLACEMENT  10/21/2011   Procedure: PERCUTANEOUS ENDOSCOPIC GASTROSTOMY (PEG) REPLACEMENT;  Surgeon: [name], MD;  [facility];  Service: Endoscopy;  Laterality: N/A;  . PEG PLACEMENT N/A 06/13/2013   Procedure: PERCUTANEOUS ENDOSCOPIC GASTROSTOMY (PEG) REPLACEMENT;  Surgeon: [name], MD;  Location[facility];  Service: Endoscopy;  Laterality: N/A;  . SPINAL FUSION    . spinal fusion to correct 70 degree kyphosis  11-2010  . spinal fusioncorrect 106 degree kyphosis    . SPINE SURGERY  ,11/20/2010, 2011   for correction of severe contracturing spinal kyphosis.   . TONSILLECTOMY      There were no vitals filed  for this visit.      Subjective Assessment - 03/10/17 2158    Subjective pt's mother states she does not like the [place] cushion pt is on because it does not provide any positioning for him   Pertinent History CP with spastic quadriplegia;     Patient Stated Goals work on stretching legs; work on sitting balance; stretching for UE's and neck - pt to see Dr. Naaman Plummer on 02-23-17   Currently in Pain? No/denies      Pt was transferred from wheelchair to mat by total assist from caregiver  TherEx:   PROM of bil. LE's including hip, knee and ankle: PROM of bil. UE's for shoulder flexion/extension and elbow flexion/extension and wrist extension and finger extension on each hand  Mother  videotaped PROM for instruction for pt's caregivers                           PT Long Term Goals - 03/02/17 1754      PT LONG TERM GOAL #1   Title Pt will sit unsupported on side of mat for 3" with CGA with UE support prn.   (TARGET DATE 03-21-17)   Baseline 03/02/17 discontinued based on conversation with caregivers; pt is total assist, cannot functionally use his UEs for support; they already do EOB as an activity, however is not functional for him   Time 4   Period Weeks   Status Deferred     PT LONG TERM GOAL #2   Title Instruct caregiver in HEP for stretching UE's, LE's and lateral cervical flexion.  03-21-17   Baseline 5/1 Reviewed shoulder, elbow, wrist, fingers, hips, knees, hamstrings   Time 4   Period Weeks   Status On-going     PT LONG TERM GOAL #3   Title Pt will subjectively report at least 25% improvement in mobility/functional use of LUE.  03-21-17   Time 4   Period Weeks   Status On-going               Plan - 03/10/17 2200    Clinical Impression Statement Minimal change noted in passive wrist extension with pt s/p Botox injections on 03-02-17.  Instructed caregiver in PROM for bil. LE's and UE's; pt 's mother videotaped PROM for pt's caregivers.   Rehab Potential Good   PT Frequency 2x / week   PT Duration 4 weeks   PT Treatment/Interventions ADLs/Self Care Home Management;Functional mobility training;Therapeutic activities;Therapeutic exercise;Balance training;Neuromuscular re-education;Patient/family education;Passive range of motion;Manual techniques   PT Next Visit Plan ROM of UEs (s/p botox 5/1--can take 10-14 days for most effectiveness); sitting EOB goal d/c'd   PT Home Exercise Plan stretching for UE's   Consulted and Agree with Plan of Care Patient;Family member/caregiver   Family Member Consulted [name] (mother) and caregiver Quillian Quince      Patient will benefit from skilled therapeutic intervention in order to improve the following  deficits and impairments:  Decreased balance, Decreased mobility, Decreased strength, Hypomobility, Impaired tone, Impaired UE functional use, Impaired flexibility, Postural dysfunction, Decreased range of motion  Visit Diagnosis: Muscle weakness (generalized)  Contracture of muscle, multiple sites  Other symptoms and signs involving the musculoskeletal system     Problem List Patient Active Problem List   Diagnosis Date Noted  . Cerebral palsy ([place]) 04/14/2016  . Increased oropharyngeal secretions 04/14/2016  . Medicare annual wellness visit, subsequent 08/25/2015  . Hyperlipidemia, mild 08/25/2015  . Reflux 12/17/2014  . Rectal bleeding  09/06/2014  . Esophageal dysphagia 09/06/2014  . Dysphagia, pharyngoesophageal phase 09/02/2014  . Impetigo 09/02/2014  . Abnormal thyroid function test 06/10/2014  . Acute nonsuppurative otitis media of right ear 05/13/2014  . Acute bronchitis 02/27/2014  . Protein-calorie malnutrition, severe ([place]) 11/23/2013  . Decreased oral intake 11/22/2013  . Gastrostomy in place [facility]) 08/31/2013  . 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 Cervical dystonia 08/23/2013  . Dysphagia, oropharyngeal phase 08/10/2013  . Low back pain 07/11/2013  . 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 Anemia 12/05/2010  . 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 GASTROSTOMY COMPLICATION 66/04/3015  . Infantile cerebral palsy ([place]) 08/04/2010  . Thyrotoxicosis 08/01/2010  . Depression with anxiety 08/01/2010  . GERD 08/01/2010  . OTHER ACNE 10/16/2009  . PALPITATIONS 02/25/2009  . HIP PAIN, BILATERAL 11/06/2008  . NEVI, MULTIPLE 10/09/2008  . Dysphagia 04/02/2008  . Incontinence of feces 04/02/2008  . Urinary incontinence 04/02/2008  . 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 FACIAL RASH 04/29/2007  . PALSY, INFANTILE CEREBRAL, 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    Alda Lea, PT 03/10/2017, 10:06 PM  [facility] [address] [place], [place], [address] Phone: [phone]   Fax:  [phone]  Name: [name] MRN: [id number] Date of Birth: April 30, 1986

## 2017-03-11 ENCOUNTER — Ambulatory Visit: Payer: Medicare Other | Admitting: Physical Therapy

## 2017-03-11 DIAGNOSIS — R293 Abnormal posture: Secondary | ICD-10-CM | POA: Diagnosis not present

## 2017-03-11 DIAGNOSIS — M6281 Muscle weakness (generalized): Secondary | ICD-10-CM | POA: Diagnosis not present

## 2017-03-11 DIAGNOSIS — M6249 Contracture of muscle, multiple sites: Secondary | ICD-10-CM

## 2017-03-11 DIAGNOSIS — R29898 Other symptoms and signs involving the musculoskeletal system: Secondary | ICD-10-CM | POA: Diagnosis not present

## 2017-03-12 NOTE — Therapy (Signed)
[facility] [address] [place], [place], [address] Phone: [phone]   Fax:  [phone]  Physical Therapy Treatment  Patient Details  Name: [name] MRN: [id number] Date of Birth: May 15, 1986 Referring Provider: Dr. [name]  Encounter Date: 03/11/2017      PT End of Session - 03/12/17 1604    Visit Number 5   Number of Visits 9   Date for PT Re-Evaluation 03/21/17   Authorization Type UHC Medicare   Authorization Time Period 02-19-17 - 04-20-17   PT Start Time 1318   PT Stop Time 1401   PT Time Calculation (min) 43 min      Past Medical History:  Diagnosis Date  . Cerebral palsy ([place])   . Dehydration 11/22/2013  . Depression with anxiety 08/01/2010   Qualifier: Diagnosis of  By: Nelson-Smith CMA (AAMA), Dottie    . Esophagitis 2011  . Gastrostomy in place [facility]) 08/31/2013  . GERD (gastroesophageal reflux disease)   . Hyperlipidemia, mild 08/25/2015  . Hyperthyroidism   . Incontinence of feces   . Loss of weight 08/28/2014  . Medicare annual wellness visit, subsequent 08/25/2015  . Palpitations     Past Surgical History:  Procedure Laterality Date  . baclofen trial    . baslofen pump implant    . ears tubes    . EYE SURGERY    . FLEXIBLE SIGMOIDOSCOPY N/A 09/07/2014   Procedure: FLEXIBLE SIGMOIDOSCOPY;  Surgeon: [name], MD;  Location: [facility] ENDOSCOPY;  Service: Endoscopy;  Laterality: N/A;  . g-tube insert  August 2006  . hamstring released     to treat contractures.   Marland Kitchen HIP SURGERY     x2 , side   . IR GENERIC HISTORICAL  07/01/2016   IR GASTR TUBE CONVERT GASTR-JEJ PER W/FL MOD SED 07/01/2016 [name], MD [facility]  . IR GENERIC HISTORICAL  07/08/2016   IR PATIENT EVAL TECH 0-60 MINS 07/08/2016 [name], MD [facility]  . IR GENERIC HISTORICAL  07/14/2016   IR GJ TUBE CHANGE 07/14/2016 [name], MD [facility]  . IR GENERIC HISTORICAL  07/21/2016   IR PATIENT EVAL  TECH 0-60 MINS [facility]  . IR GENERIC HISTORICAL  08/31/2016   IR [place] DUODEN/JEJUNO TUBE PERCUT W/FLUORO 08/31/2016 [name], MD [facility]  . IR GENERIC HISTORICAL  09/03/2016   IR GJ TUBE CHANGE 09/03/2016 [name], MD [facility]  . IR GENERIC HISTORICAL  09/10/2016   IR GASTR TUBE CONVERT GASTR-JEJ PER W/FL MOD SED 09/10/2016 [facility]  . IR GENERIC HISTORICAL  09/16/2016   IR PATIENT EVAL TECH 0-60 MINS [facility]  . IR GENERIC HISTORICAL  09/29/2016   IR GJ TUBE CHANGE 09/29/2016 [name], MD [facility]  . IR GJ TUBE CHANGE  02/05/2017  . PEG PLACEMENT  10/21/2011   Procedure: PERCUTANEOUS ENDOSCOPIC GASTROSTOMY (PEG) REPLACEMENT;  Surgeon: [name], MD;  [facility];  Service: Endoscopy;  Laterality: N/A;  . PEG PLACEMENT N/A 06/13/2013   Procedure: PERCUTANEOUS ENDOSCOPIC GASTROSTOMY (PEG) REPLACEMENT;  Surgeon: [name], MD;  [facility];  Service: Endoscopy;  Laterality: N/A;  . SPINAL FUSION    . spinal fusion to correct 70 degree kyphosis  11-2010  . spinal fusioncorrect 106 degree kyphosis    . SPINE SURGERY  ,11/20/2010, 2011   for correction of severe contracturing spinal kyphosis.   . TONSILLECTOMY      There were no vitals filed  for this visit.      Subjective Assessment - 03/12/17 1602    Subjective Mother states they have gone back to using the [employer] cushion due to better positioning achieved with this cushion   Patient is accompained by: Family member   Pertinent History CP with spastic quadriplegia;     Patient Stated Goals work on stretching legs; work on sitting balance; stretching for UE's and neck - pt to see Dr. Naaman Plummer on 02-23-17   Currently in Pain? No/denies      TherEx:  Performed PROM of bil. LE's - including stretching bil. Hamstrings with pt supine x 30 sec hold x 2 reps Hip flexion and extension 15 reps each leg;  Passive dorsiflexion bil. Feet with 30 sec hold x 2 reps each  PROM  of bil. UE's including shoulder flexion/extension, abduction 10 reps each; bil. Elbow flexion/extension 10 reps and bil. Wrist extension as able due to flexion contracture at 90 degrees of Rt and Lt wrist Attempted passive finger extension on each hand as tolerated  Sitting balance - unsupported on side of mat - with mod to min assist - then with close SBA for 15 secs 1st rep and then 60 secs After multiple trials                                PT Long Term Goals - 03/12/17 1608      PT LONG TERM GOAL #1   Title Pt will sit unsupported on side of mat for 3" with CGA with UE support prn.   (TARGET DATE 03-21-17)   Baseline reinstate this goal as of 03-11-17 - pt able to sit 1" unsupported on side of mat   Time 4   Period Weeks   Status New     PT LONG TERM GOAL #2   Title Instruct caregiver in HEP for stretching UE's, LE's and lateral cervical flexion.  03-21-17     PT LONG TERM GOAL #3   Title Pt will subjectively report at least 25% improvement in mobility/functional use of LUE.  03-21-17               Plan - 03/12/17 1605    Clinical Impression Statement Pt able to sit unsupported for 15 secs on 1st rep and then 60 secs after mutiple attempts in maintaining sitting balance on side of mat; slight improvement noted in incr. Lt wrist extension (due to effect of Botox)   Rehab Potential Good   PT Frequency 2x / week   PT Duration 4 weeks   PT Treatment/Interventions ADLs/Self Care Home Management;Functional mobility training;Therapeutic activities;Therapeutic exercise;Balance training;Neuromuscular re-education;Patient/family education;Passive range of motion;Manual techniques   PT Next Visit Plan ROM of UEs (s/p botox 5/1--can take 10-14 days for most effectiveness); sitting EOB goal reinstated - 03-11-17   PT Home Exercise Plan stretching for UE's   Consulted and Agree with Plan of Care Patient;Family member/caregiver   Family Member Consulted [name]  (mother) and caregiver Quillian Quince      Patient will benefit from skilled therapeutic intervention in order to improve the following deficits and impairments:  Decreased balance, Decreased mobility, Decreased strength, Hypomobility, Impaired tone, Impaired UE functional use, Impaired flexibility, Postural dysfunction, Decreased range of motion  Visit Diagnosis: Muscle weakness (generalized)  Contracture of muscle, multiple sites     Problem List Patient Active Problem List   Diagnosis Date Noted  . Cerebral palsy ([place]) 04/14/2016  .  Increased oropharyngeal secretions 04/14/2016  . Medicare annual wellness visit, subsequent 08/25/2015  . Hyperlipidemia, mild 08/25/2015  . Reflux 12/17/2014  . Rectal bleeding 09/06/2014  . Esophageal dysphagia 09/06/2014  . Dysphagia, pharyngoesophageal phase 09/02/2014  . Impetigo 09/02/2014  . Abnormal thyroid function test 06/10/2014  . Acute nonsuppurative otitis media of right ear 05/13/2014  . Acute bronchitis 02/27/2014  . Protein-calorie malnutrition, severe ([place]) 11/23/2013  . Decreased oral intake 11/22/2013  . Gastrostomy in place [facility]) 08/31/2013  . 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 Cervical dystonia 08/23/2013  . Dysphagia, oropharyngeal phase 08/10/2013  . Low back pain 07/11/2013  . 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 Anemia 12/05/2010  . 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 GASTROSTOMY COMPLICATION 32/10/2481  . Infantile cerebral palsy ([place]) 08/04/2010  . Thyrotoxicosis 08/01/2010  . Depression with anxiety 08/01/2010  . GERD 08/01/2010  . OTHER ACNE 10/16/2009  . PALPITATIONS 02/25/2009  . HIP PAIN, BILATERAL 11/06/2008  . NEVI, MULTIPLE 10/09/2008  . Dysphagia 04/02/2008  . Incontinence of feces 04/02/2008  . Urinary incontinence 04/02/2008  . 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 FACIAL RASH 04/29/2007  . PALSY, INFANTILE CEREBRAL, 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    Alda Lea, PT 03/12/2017,  4:10 PM  [facility] [address] [place], [place], [address] Phone: [phone]   Fax:  [phone]  Name: [name] MRN: [id number] Date of Birth: Jan 14, 1986

## 2017-03-16 ENCOUNTER — Telehealth: Payer: Self-pay

## 2017-03-16 ENCOUNTER — Encounter: Payer: Self-pay | Admitting: Family Medicine

## 2017-03-16 ENCOUNTER — Ambulatory Visit: Payer: Self-pay | Admitting: Physical Therapy

## 2017-03-16 ENCOUNTER — Ambulatory Visit (INDEPENDENT_AMBULATORY_CARE_PROVIDER_SITE_OTHER): Payer: Medicare Other | Admitting: Family Medicine

## 2017-03-16 DIAGNOSIS — G808 Other cerebral palsy: Secondary | ICD-10-CM | POA: Diagnosis not present

## 2017-03-16 DIAGNOSIS — R1319 Other dysphagia: Secondary | ICD-10-CM

## 2017-03-16 DIAGNOSIS — R1310 Dysphagia, unspecified: Secondary | ICD-10-CM | POA: Diagnosis not present

## 2017-03-16 DIAGNOSIS — R636 Underweight: Secondary | ICD-10-CM

## 2017-03-16 DIAGNOSIS — H6191 Disorder of right external ear, unspecified: Secondary | ICD-10-CM | POA: Insufficient documentation

## 2017-03-16 DIAGNOSIS — L989 Disorder of the skin and subcutaneous tissue, unspecified: Secondary | ICD-10-CM | POA: Diagnosis not present

## 2017-03-16 DIAGNOSIS — G809 Cerebral palsy, unspecified: Secondary | ICD-10-CM | POA: Diagnosis not present

## 2017-03-16 HISTORY — DX: Disorder of right external ear, unspecified: H61.91

## 2017-03-16 HISTORY — DX: Underweight: R63.6

## 2017-03-16 NOTE — Assessment & Plan Note (Signed)
Has been recurrent and is following with ENT, Dr Janace Hoard, they are encouraged to cleanse daily with H2O2 and apply Mupirocin ointment. Try for one week to apply Mupirocin to both nares to try and suppress recurrence. Will consider surgical removal if recurrence continues to occur

## 2017-03-16 NOTE — Assessment & Plan Note (Signed)
Has stayed off of oral intake and he has had no further aspiration. Had a very good winter

## 2017-03-16 NOTE — Progress Notes (Signed)
Subjective:  I acted as a Education administrator for Dr. Charlett [name]. [name], [place]  Patient ID: [name], male    DOB: 06-12-1986, 31 y.o.   MRN: [id number]  Chief Complaint  Patient presents with  . Follow-up    HPI  Patient is in today for a 6 month follow up. Patient is in with his Mom and caregiver [name]. Mom stated he has done well since the last visit. She c/o Right ear discomfort behind the ear, which looks like an infection and bleeds out some. Mom would like to change his rx to help with loose stool to add more fiber into his diet. 3 Jevity and 2 vital. Mom has noticed arthritic changes in his digits. He has not had any hospitalizations this winter or aspirations. He is having more pain in his hands due to contractures. Mother and patient deny CP/palp/SOB/HA/congestion/fevers or GU c/o. Taking meds as prescribed  Patient Care Team: Chad Lukes, MD as PCP - General (Family Medicine)   Past Medical History:  Diagnosis Date  . Cerebral palsy ([place])   . Dehydration 11/22/2013  . Depression with anxiety 08/01/2010   Qualifier: Diagnosis of  By: [name] CMA (AAMA), Chad    . Esophagitis 2011  . Gastrostomy in place [facility]) 08/31/2013  . GERD (gastroesophageal reflux disease)   . Hyperlipidemia, mild 08/25/2015  . Hyperthyroidism   . Incontinence of feces   . Loss of weight 08/28/2014  . Medicare annual wellness visit, subsequent 08/25/2015  . Mildly underweight adult 03/16/2017  . Palpitations   . Skin lesion of right ear 03/16/2017    Past Surgical History:  Procedure Laterality Date  . baclofen trial    . baslofen pump implant    . ears tubes    . EYE SURGERY    . FLEXIBLE SIGMOIDOSCOPY N/A 09/07/2014   Procedure: FLEXIBLE SIGMOIDOSCOPY;  Surgeon: Chad Bears, MD;  Location: [facility] ENDOSCOPY;  Service: Endoscopy;  Laterality: N/A;  . g-tube insert  August 2006  . hamstring released     to treat contractures.   [facility] HIP SURGERY     x2 , side   . IR GENERIC HISTORICAL  07/01/2016     IR GASTR TUBE CONVERT GASTR-JEJ PER W/FL MOD SED 07/01/2016 Chad Edouard, MD [facility]  . IR GENERIC HISTORICAL  07/08/2016   IR PATIENT EVAL TECH 0-60 MINS 07/08/2016 Chad Edouard, MD [facility]  . IR GENERIC HISTORICAL  07/14/2016   IR GJ TUBE CHANGE 07/14/2016 Chad Mariscal, MD [facility]  . IR GENERIC HISTORICAL  07/21/2016   IR PATIENT EVAL TECH 0-60 MINS [facility]  . IR GENERIC HISTORICAL  08/31/2016   IR [place] DUODEN/JEJUNO TUBE PERCUT W/FLUORO 08/31/2016 Chad Keen, MD [facility]  . IR GENERIC HISTORICAL  09/03/2016   IR GJ TUBE CHANGE 09/03/2016 Chad Mariscal, MD [facility]  . IR GENERIC HISTORICAL  09/10/2016   IR GASTR TUBE CONVERT GASTR-JEJ PER W/FL MOD SED 09/10/2016 [facility]  . IR GENERIC HISTORICAL  09/16/2016   IR PATIENT EVAL TECH 0-60 MINS [facility]  . IR GENERIC HISTORICAL  09/29/2016   IR GJ TUBE CHANGE 09/29/2016 Chad Cleveland, MD [facility]  . IR GJ TUBE CHANGE  02/05/2017  . PEG PLACEMENT  10/21/2011   Procedure: PERCUTANEOUS ENDOSCOPIC GASTROSTOMY (PEG) REPLACEMENT;  Surgeon: Chad Dragon, MD;  Location: [facility] ENDOSCOPY;  Service: Endoscopy;  Laterality: N/A;  . PEG PLACEMENT N/A 06/13/2013   Procedure: PERCUTANEOUS ENDOSCOPIC GASTROSTOMY (PEG) REPLACEMENT;  Surgeon:  Chad Dragon, MD;  Location: [facility] ENDOSCOPY;  Service: Endoscopy;  Laterality: N/A;  . SPINAL FUSION    . spinal fusion to correct 70 degree kyphosis  11-2010  . spinal fusioncorrect 106 degree kyphosis    . SPINE SURGERY  ,11/20/2010, 2011   for correction of severe contracturing spinal kyphosis.   . TONSILLECTOMY      Family History  Problem Relation Age of Onset  . Asthma Mother   . Hyperlipidemia Mother   . COPD Mother   . Other Mother        bronchial stasis/ABPA  . Cancer Maternal Grandmother 12       breast  . Hyperlipidemia Maternal Grandmother   . Hypertension Maternal Grandmother   . Cancer Maternal Grandfather        prostate  . Heart disease Paternal  Grandfather        CHF  . Osteoporosis Paternal Grandmother   . Arthritis Paternal Grandmother        rheumatoid    Social History   Social History  . Marital status: Single    Spouse name: N/A  . Number of children: 0  . Years of education: N/A   Occupational History  . disbaled    Social History Main Topics  . Smoking status: Never Smoker  . Smokeless tobacco: Never Used  . Alcohol use No  . Drug use: No  . Sexual activity: No   Other Topics Concern  . Not on file   Social History Narrative  . No narrative on file    Outpatient Medications Prior to Visit  Medication Sig Dispense Refill  . AMBULATORY NON FORMULARY MEDICATION Medication Name: MIC gastrostomy/bolus feeding tube 24 French Part number 0110-24. #2 and 10 cc lurer lock syringe #2 Dx: 4 Device 2  . bacitracin 500 UNIT/GM ointment Apply 1 application topically 2 (two) times daily. 30 g 1  . clotrimazole-betamethasone (LOTRISONE) cream Apply 1 application topically 2 (two) times daily. 45 g 1  . dantrolene (DANTRIUM) 50 MG capsule TAKE 1 CAPSULE BY MOUTH 3 TIMES A DAY. 90 capsule 4  . diphenoxylate-atropine (LOMOTIL) 2.5-0.025 MG tablet Take 1-2 tablets via tub three times daily as needed for loose stool 90 tablet 2  . divalproex (DEPAKOTE SPRINKLE) 125 MG capsule Take 2 capsules in morning, 5 capsules at bedtime (Patient taking differently: 250-625 mg. Take 2 capsules in morning, 5 capsules at bedtime) 210 capsule 4  . Incontinence Supply Disposable (PREVAIL BREEZERS MEDIUM) MISC pkg of 16- size medium 32" to 44"  Breathable cloth-like outer fabric (can't use the plastic outer surgace  Item # [id number] 16 each 6  . LORazepam (ATIVAN) 1 MG tablet Take one tablet in evenings as needed for insomnia. 30 tablet 1  . mupirocin ointment (BACTROBAN) 2 % Apply to area thin film twice daily if needed 22 g 0  . NON FORMULARY Bard Leg Bag Extension tubing w/Connector 18", Sterile, latex-free  Item# [id number]    . NON  FORMULARY Colorplast Freedom Cath Latex Self-Adhering Male External Catheter 66mm Diameter Intermediate  Item# [id number]    . NONFORMULARY OR COMPOUNDED ITEM [employer] REF [id number] - [place] Joey Pump Set with Flush Bags - 1000 mL 1 each 0  . Nutritional Supplements (FEEDING SUPPLEMENT, JEVITY 1.5 CAL,) LIQD Run tube feed at 70 mL per hour over 17 hours. 35550 mL 11  . nystatin cream (MYCOSTATIN) Apply 1 application topically 2 (two) times daily as needed for dry skin. 30 g 1  .  OLANZapine (ZYPREXA) 2.5 MG tablet Place 1 tablet into feeding tube 3 (three) times daily.  3  . Omeprazole-Sodium Bicarbonate (ZEGERID) 20-1100 MG CAPS capsule Take 1 capsule by mouth daily before breakfast.    . Ostomy Supplies (PROTECTIVE BARRIER WIPES) MISC 1-1/4" X 3"  Item #[id number] 75 each 6  . PARoxetine (PAXIL) 10 MG tablet Take 15 mg by mouth daily.     No facility-administered medications prior to visit.     Allergies  Allergen Reactions  . Ambien [Zolpidem Tartrate] Nausea Only  . Antihistamines, Chlorpheniramine-Type     Other reaction(s): Other (See Comments) Other Reaction: agitation  . Codeine Other (See Comments)    Makes patient too active after a few days.  . Metoclopramide Other (See Comments)    Delusion, emotionality   . Baclofen Anxiety  . Sulfonamide Derivatives Rash    Review of Systems  Constitutional: Negative for fever and malaise/fatigue.  HENT: Negative for congestion.   Eyes: Negative for blurred vision.  Respiratory: Negative for shortness of breath.   Cardiovascular: Negative for chest pain, palpitations and leg swelling.  Gastrointestinal: Negative for abdominal pain, blood in stool and nausea.  Genitourinary: Negative for dysuria and frequency.  Musculoskeletal: Negative for falls.  Skin: Negative for rash.  Neurological: Negative for dizziness, loss of consciousness and headaches.  Endo/Heme/Allergies: Negative for environmental allergies.  Psychiatric/Behavioral:  Negative for depression. The patient is not nervous/anxious.        Objective:    Physical Exam  Constitutional: He is oriented to person, place, and time. He appears well-developed and well-nourished. No distress.  HENT:  Head: Normocephalic and atraumatic.  Nose: Nose normal.  Eyes: Right eye exhibits no discharge. Left eye exhibits no discharge.  Neck: Normal range of motion. Neck supple.  Cardiovascular: Normal rate and regular rhythm.   No murmur heard. Pulmonary/Chest: Effort normal and breath sounds normal.  Abdominal: Soft. Bowel sounds are normal. There is no tenderness.  RUQ GJ tube in place no fluctuance  Musculoskeletal: He exhibits no edema.  Neurological: He is alert and oriented to person, place, and time.  Spastic paraplegia noted  Skin: Skin is warm and dry.  Scabbed lesion behind right ear lobe with mild surrounding erythema  Psychiatric: He has a normal mood and affect.  Nursing note and vitals reviewed.   BP 107/75 (BP Location: Left Arm, Patient Position: Sitting, Cuff Size: Normal)   Pulse 87   Temp 97.9 F (36.6 C) (Oral)   Resp 18   Wt 95 lb (43.1 kg)   SpO2 96%   BMI 17.38 kg/m  Wt Readings from Last 3 Encounters:  03/16/17 95 lb (43.1 kg)  11/26/16 96 lb (43.5 kg)  10/02/16 93 lb (42.2 kg)   BP Readings from Last 3 Encounters:  03/16/17 107/75  03/02/17 116/76  02/23/17 109/72     Immunization History  Administered Date(s) Administered  . Hep A / Hep B 04/07/2011, 05/14/2011  . Influenza Split 09/01/2011  . Influenza Whole 10/11/2007, 08/22/2008, 08/15/2009, 08/05/2010, 08/11/2012  . Influenza,inj,Quad PF,36+ Mos 08/06/2013, 07/16/2014, 08/15/2015, 08/27/2016  . PPD Test 04/07/2011  . Pneumococcal Conjugate-13 08/27/2016  . Pneumococcal Polysaccharide-23 08/15/2009  . Td 06/14/2002, 06/19/2009    Health Maintenance  Topic Date Due  . HIV Screening  03/16/1999  . INFLUENZA VACCINE  06/02/2017  . TETANUS/TDAP  06/20/2019    Lab  Results  Component Value Date   WBC 6.2 09/02/2016   HGB 14.6 09/02/2016   HCT 42.5 09/02/2016  PLT 219.0 09/02/2016   GLUCOSE 83 08/04/2016   CHOL 118 08/04/2016   TRIG 67.0 08/04/2016   HDL 38.00 (L) 08/04/2016   LDLCALC 67 08/04/2016   ALT 17 08/04/2016   AST 16 08/04/2016   NA 140 08/04/2016   K 4.0 08/04/2016   CL 104 08/04/2016   CREATININE 0.34 (L) 08/04/2016   BUN 8 08/04/2016   CO2 29 08/04/2016   TSH 1.38 08/04/2016   HGBA1C 5.0 04/29/2007    Lab Results  Component Value Date   TSH 1.38 08/04/2016   Lab Results  Component Value Date   WBC 6.2 09/02/2016   HGB 14.6 09/02/2016   HCT 42.5 09/02/2016   MCV 91.3 09/02/2016   PLT 219.0 09/02/2016   Lab Results  Component Value Date   NA 140 08/04/2016   K 4.0 08/04/2016   CO2 29 08/04/2016   GLUCOSE 83 08/04/2016   BUN 8 08/04/2016   CREATININE 0.34 (L) 08/04/2016   BILITOT 0.4 08/04/2016   ALKPHOS 62 08/04/2016   AST 16 08/04/2016   ALT 17 08/04/2016   PROT 7.4 08/04/2016   ALBUMIN 4.1 08/04/2016   CALCIUM 9.2 08/04/2016   ANIONGAP 11 09/18/2014   GFR 318.84 08/04/2016   Lab Results  Component Value Date   CHOL 118 08/04/2016   Lab Results  Component Value Date   HDL 38.00 (L) 08/04/2016   Lab Results  Component Value Date   LDLCALC 67 08/04/2016   Lab Results  Component Value Date   TRIG 67.0 08/04/2016   Lab Results  Component Value Date   CHOLHDL 3 08/04/2016   Lab Results  Component Value Date   HGBA1C 5.0 04/29/2007         Assessment & Plan:   Problem List Items Addressed This Visit    Esophageal dysphagia (Chronic)    Has stayed off of oral intake and he has had no further aspiration. Had a very good winter      PALSY, INFANTILE CEREBRAL, QUADRIPLEGIC    Recently had botox in both wrists and hands recently with Dr Naaman Plummer of [facility] has gotten relief.       Infantile cerebral palsy ([place])    Doing well at home and with therapy      Mildly underweight adult     Weight is stable but has had some recent loose stool mother is requesting that we switch his Vital 1.5 cans from 3 to 2 and increase his Jevity from 2 to 3 cans since it has fiber in it. New rx sent      Skin lesion of right ear    Has been recurrent and is following with ENT, Dr Janace Hoard, they are encouraged to cleanse daily with H2O2 and apply Mupirocin ointment. Try for one week to apply Mupirocin to both nares to try and suppress recurrence. Will consider surgical removal if recurrence continues to occur         I am having Mr. Schlicker maintain his nystatin cream, divalproex, clotrimazole-betamethasone, Omeprazole-Sodium Bicarbonate, NON FORMULARY, NON FORMULARY, Protective Barrier Wipes, PREVAIL BREEZERS MEDIUM, bacitracin, NONFORMULARY OR COMPOUNDED ITEM, LORazepam, feeding supplement (JEVITY 1.5 CAL), dantrolene, PARoxetine, AMBULATORY NON FORMULARY MEDICATION, OLANZapine, mupirocin ointment, diphenoxylate-atropine, and feeding supplement (VITAL 1.5 CAL).  Meds ordered this encounter  Medications  . Nutritional Supplements (FEEDING SUPPLEMENT, VITAL 1.5 CAL,) LIQD    Sig: Place 1,000 mLs into feeding tube continuous.    CMA served as Education administrator during this visit. History, Physical and Plan performed by  medical provider. Documentation and orders reviewed and attested to.  [name], MD

## 2017-03-16 NOTE — Assessment & Plan Note (Addendum)
Weight is stable but has had some recent loose stool mother is requesting that we switch his Vital 1.5 cans from 3 to 2 and increase his Jevity from 2 to 3 cans since it has fiber in it. New rx sent

## 2017-03-16 NOTE — Telephone Encounter (Signed)
Please change orders to following and resend it to [place] 1.5  Vital 1.5

## 2017-03-16 NOTE — Assessment & Plan Note (Signed)
Recently had botox in both wrists and hands recently with Dr Naaman Plummer of [facility] has gotten relief.

## 2017-03-16 NOTE — Telephone Encounter (Signed)
Left message on general answering machine stating the need to change jis nutritional feeding  to what is listed below. Per his mother and Dr. Charlett Blake they want him to have more fiber in his diet and when mom gives him more Jevity it helps with his bowel movements. Mom wants to increase his Jevity to 3 and the vital to 2/ 3 times a day. I called today  and spoke with Arbie Cookey from [facility] and she told me exactly what to write on the Rx pad in order for his nutrition supplies to be ordered. This is a new rx.     PC

## 2017-03-16 NOTE — Telephone Encounter (Signed)
As requested medication change has been sent over to [facility].   Change is as follows: Jevity 3 Vital 2 By feeding pumb Rate: 80 mLs/ 15 hours Total 5 cans per day.   Rx has been sent over confirmation received. Waiting for order to be faxed back.     PC

## 2017-03-16 NOTE — Patient Instructions (Addendum)
Lidocaine roll on for the pain in the hands      Joint Pain Joint pain, which is also called arthralgia, can be caused by many things. Joint pain often goes away when you follow your health care provider's instructions for relieving pain at home. However, joint pain can also be caused by conditions that require further treatment. Common causes of joint pain include:  Bruising in the area of the joint.  Overuse of the joint.  Wear and tear on the joints that occur with aging (osteoarthritis).  Various other forms of arthritis.  A buildup of a crystal form of uric acid in the joint (gout).  Infections of the joint (septic arthritis) or of the bone (osteomyelitis). Your health care provider may recommend medicine to help with the pain. If your joint pain continues, additional tests may be needed to diagnose your condition. Follow these instructions at home: Watch your condition for any changes. Follow these instructions as directed to lessen the pain that you are feeling.  Take medicines only as directed by your health care provider.  Rest the affected area for as long as your health care provider says that you should. If directed to do so, raise the painful joint above the level of your heart while you are sitting or lying down.  Do not do things that cause or worsen pain.  If directed, apply ice to the painful area:  Put ice in a plastic bag.  Place a towel between your skin and the bag.  Leave the ice on for 20 minutes, 2-3 times per day.  Wear an elastic bandage, splint, or sling as directed by your health care provider. Loosen the elastic bandage or splint if your fingers or toes become numb and tingle, or if they turn cold and blue.  Begin exercising or stretching the affected area as directed by your health care provider. Ask your health care provider what types of exercise are safe for you.  Keep all follow-up visits as directed by your health care provider. This is  important. Contact a health care provider if:  Your pain increases, and medicine does not help.  Your joint pain does not improve within 3 days.  You have increased bruising or swelling.  You have a fever.  You lose 10 lb (4.5 kg) or more without trying. Get help right away if:  You are not able to move the joint.  Your fingers or toes become numb or they turn cold and blue. This information is not intended to replace advice given to you by your health care provider. Make sure you discuss any questions you have with your health care provider. Document Released: 10/19/2005 Document Revised: 03/20/2016 Document Reviewed: 07/31/2014 Elsevier Interactive Patient Education  2017 [employer].

## 2017-03-16 NOTE — Assessment & Plan Note (Signed)
He is doing GJ tube now and has had some obstructions but they are considering switching to separate G and J tubes. Follows with [facility]

## 2017-03-16 NOTE — Assessment & Plan Note (Signed)
Doing well at home and with therapy

## 2017-03-17 ENCOUNTER — Other Ambulatory Visit: Payer: Self-pay

## 2017-03-17 ENCOUNTER — Telehealth: Payer: Self-pay

## 2017-03-17 MED ORDER — VITAL 1.5 CAL PO LIQD: 80.0000 mL | ORAL | 11 refills | Status: DC

## 2017-03-17 MED ORDER — JEVITY 1.5 CAL PO LIQD: ORAL | 11 refills | Status: DC

## 2017-03-17 MED ORDER — VITAL 1.5 CAL PO LIQD
80.0000 mL | ORAL | 11 refills | Status: DC
Start: 2017-03-17 — End: 2020-03-12

## 2017-03-17 NOTE — Telephone Encounter (Signed)
Received call from [place]. Feeding tube suppliments ordered yesterday were orded  incorrectly. Need correct Rx sent . Ordered Jevity 1 ml and Vital 1 ml needs to read 1.53ml on both. Please advise.

## 2017-03-17 NOTE — Telephone Encounter (Signed)
Supplements signed per [facility] request

## 2017-03-18 ENCOUNTER — Ambulatory Visit: Payer: Medicare Other | Admitting: Physical Therapy

## 2017-03-18 DIAGNOSIS — R29898 Other symptoms and signs involving the musculoskeletal system: Secondary | ICD-10-CM | POA: Diagnosis not present

## 2017-03-18 DIAGNOSIS — M6249 Contracture of muscle, multiple sites: Secondary | ICD-10-CM | POA: Diagnosis not present

## 2017-03-18 DIAGNOSIS — R293 Abnormal posture: Secondary | ICD-10-CM | POA: Diagnosis not present

## 2017-03-18 DIAGNOSIS — M6281 Muscle weakness (generalized): Secondary | ICD-10-CM

## 2017-03-19 NOTE — Telephone Encounter (Signed)
Written Order received from [place]; forward to PCP/[facility] 05/18

## 2017-03-19 NOTE — Telephone Encounter (Signed)
Done

## 2017-03-19 NOTE — Therapy (Signed)
[facility] [address] [place], [place], [address] Phone: [phone]   Fax:  [phone]  Physical Therapy Treatment  Patient Details  Name: [name] MRN: [id number] Date of Birth: Oct 29, 1986 Referring Provider: Dr. [name]  Encounter Date: 03/18/2017      PT End of Session - 03/19/17 1644    Visit Number 6   Number of Visits 9   Date for PT Re-Evaluation 03/21/17   Authorization Type [employer] Medicare   Authorization Time Period 02-19-17 - 04-20-17   PT Start Time 1316   PT Stop Time 1402   PT Time Calculation (min) 46 min      Past Medical History:  Diagnosis Date  . Cerebral palsy ([place])   . Dehydration 11/22/2013  . Depression with anxiety 08/01/2010   Qualifier: Diagnosis of  By: Nelson-Smith CMA (AAMA), Dottie    . Esophagitis 2011  . Gastrostomy in place [facility]) 08/31/2013  . GERD (gastroesophageal reflux disease)   . Hyperlipidemia, mild 08/25/2015  . Hyperthyroidism   . Incontinence of feces   . Loss of weight 08/28/2014  . Medicare annual wellness visit, subsequent 08/25/2015  . Mildly underweight adult 03/16/2017  . Palpitations   . Skin lesion of right ear 03/16/2017    Past Surgical History:  Procedure Laterality Date  . baclofen trial    . baslofen pump implant    . ears tubes    . EYE SURGERY    . FLEXIBLE SIGMOIDOSCOPY N/A 09/07/2014   Procedure: FLEXIBLE SIGMOIDOSCOPY;  Surgeon: [name], MD;  Location: [facility] ENDOSCOPY;  Service: Endoscopy;  Laterality: N/A;  . g-tube insert  August 2006  . hamstring released     to treat contractures.   Marland Kitchen HIP SURGERY     x2 , side   . IR GENERIC HISTORICAL  07/01/2016   IR GASTR TUBE CONVERT GASTR-JEJ PER W/FL MOD SED 07/01/2016 [name], MD [facility]  . IR GENERIC HISTORICAL  07/08/2016   IR PATIENT EVAL TECH 0-60 MINS 07/08/2016 [name], MD [facility]  . IR GENERIC HISTORICAL  07/14/2016   IR GJ TUBE CHANGE 07/14/2016 [name], MD [facility]  . IR GENERIC HISTORICAL  07/21/2016   IR PATIENT EVAL TECH 0-60 MINS [facility]  . IR GENERIC HISTORICAL  08/31/2016   IR [place] DUODEN/JEJUNO TUBE PERCUT W/FLUORO 08/31/2016 [name], MD [facility]  . IR GENERIC HISTORICAL  09/03/2016   IR GJ TUBE CHANGE 09/03/2016 [name], MD [facility]  . IR GENERIC HISTORICAL  09/10/2016   IR GASTR TUBE CONVERT GASTR-JEJ PER W/FL MOD SED 09/10/2016 [facility]  . IR GENERIC HISTORICAL  09/16/2016   IR PATIENT EVAL TECH 0-60 MINS [facility]  . IR GENERIC HISTORICAL  09/29/2016   IR GJ TUBE CHANGE 09/29/2016 [name], MD [facility]  . IR GJ TUBE CHANGE  02/05/2017  . PEG PLACEMENT  10/21/2011   Procedure: PERCUTANEOUS ENDOSCOPIC GASTROSTOMY (PEG) REPLACEMENT;  Surgeon: [name], MD;  [facility];  Service: Endoscopy;  Laterality: N/A;  . PEG PLACEMENT N/A 06/13/2013   Procedure: PERCUTANEOUS ENDOSCOPIC GASTROSTOMY (PEG) REPLACEMENT;  Surgeon: [name], MD;  Location[facility] ENDOSCOPY;  Service: Endoscopy;  Laterality: N/A;  . SPINAL FUSION    . spinal fusion to correct 70 degree kyphosis  11-2010  . spinal fusioncorrect 106 degree kyphosis    . SPINE SURGERY  ,11/20/2010, 2011   for correction of severe contracturing spinal kyphosis.   Marland Kitchen  TONSILLECTOMY      There were no vitals filed for this visit.      Subjective Assessment - 03/19/17 1643    Subjective pt's ROHO cushion has been modified with a wedge under the front and use of ankle huggers to maintain feet placement on foot plates   Patient is accompained by: Family member   Pertinent History CP with spastic quadriplegia;     Patient Stated Goals work on stretching legs; work on sitting balance; stretching for UE's and neck - pt to see Dr. Naaman Plummer on 02-23-17   Currently in Pain? No/denies          Pt transferred wheelchair to mat with total assist by caregiver, Quillian Quince; worked on sitting balance unsupported on side of mat - pt  sat approx. 2' unsupported Pt performed trunk extension and flexion x 5 reps with mod assist  PROM for bil. LE's and UE's - bil. Hamstring stretching 30 sec hold x 2 reps each leg Hip/knee flexion and extension x 10 reps each  Bil. Shoulder flexion and abdct to 90 degrees - 10 reps each Bil. Wrist extension with slow stretch as tolerated - 15 sec hold                             PT Long Term Goals - 03/19/17 1652      PT LONG TERM GOAL #1   Title Pt will sit unsupported on side of mat for 3" with CGA with UE support prn.   (TARGET DATE 03-21-17)   Baseline pt able to sit for approx. 2" with CGA unsupported on side of mat (03-18-17)   Time 4   Period Weeks   Status Partially Met     PT LONG TERM GOAL #2   Title Instruct caregiver in HEP for stretching UE's, LE's and lateral cervical flexion.  03-21-17   Baseline 5/1 Reviewed shoulder, elbow, wrist, fingers, hips, knees, hamstrings   Time 4   Period Weeks   Status Achieved     PT LONG TERM GOAL #3   Title Pt will subjectively report at least 25% improvement in mobility/functional use of LUE.  03-21-17   Baseline Pt received Botox injections on 03-02-17 - wrist PROM is improving; pt is scheduled for OT eval in early June   Time 4   Period Weeks   Status Partially Met               Plan - 03/19/17 1647    Clinical Impression Statement Pt has met LTG #2 and partially met LTG #1 and 3:  pt's PROM is improving slowly with pt having received Botox on 03-02-17;  pt is scheduled to receive OT eval in early June   Rehab Potential Good   PT Frequency 2x / week   PT Duration 4 weeks   PT Treatment/Interventions ADLs/Self Care Home Management;Functional mobility training;Therapeutic activities;Therapeutic exercise;Balance training;Neuromuscular re-education;Patient/family education;Passive range of motion;Manual techniques   PT Next Visit Plan D/C'd on 03-18-17   PT Home Exercise Plan stretching for UE's    Recommended Other Services pt has OT eval scheduled in early June   Consulted and Agree with Plan of Care Patient;Family member/caregiver   Family Member Consulted [name] (mother) and caregiver Quillian Quince      Patient will benefit from skilled therapeutic intervention in order to improve the following deficits and impairments:  Decreased balance, Decreased mobility, Decreased strength, Hypomobility, Impaired tone, Impaired UE functional use,  Impaired flexibility, Postural dysfunction, Decreased range of motion  Visit Diagnosis: Muscle weakness (generalized)  Other symptoms and signs involving the musculoskeletal system       G-Codes - 04/10/2017 1654    Functional Assessment Tool Used (Outpatient Only) pt has PROM limitations of all 4 extremities due to spastic CP   Functional Limitation Other PT primary   Other PT Primary Goal Status (B1478) At least 40 percent but less than 60 percent impaired, limited or restricted   Other PT Primary Discharge Status (G9562) At least 40 percent but less than 60 percent impaired, limited or restricted      Problem List Patient Active Problem List   Diagnosis Date Noted  . Mildly underweight adult 03/16/2017  . Skin lesion of right ear 03/16/2017  . Cerebral palsy ([place]) 04/14/2016  . Increased oropharyngeal secretions 04/14/2016  . Medicare annual wellness visit, subsequent 08/25/2015  . Hyperlipidemia, mild 08/25/2015  . Reflux 12/17/2014  . Rectal bleeding 09/06/2014  . Esophageal dysphagia 09/06/2014  . Dysphagia, pharyngoesophageal phase 09/02/2014  . Impetigo 09/02/2014  . Abnormal thyroid function test 06/10/2014  . Acute nonsuppurative otitis media of right ear 05/13/2014  . Acute bronchitis 02/27/2014  . Protein-calorie malnutrition, severe ([place]) 11/23/2013  . Decreased oral intake 11/22/2013  . Gastrostomy in place [facility]) 08/31/2013  . 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 Cervical dystonia 08/23/2013  . Dysphagia, oropharyngeal  phase 08/10/2013  . Low back pain 07/11/2013  . 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 Anemia 12/05/2010  . 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 GASTROSTOMY COMPLICATION 13/06/6577  . Infantile cerebral palsy ([place]) 08/04/2010  . Thyrotoxicosis 08/01/2010  . Depression with anxiety 08/01/2010  . GERD 08/01/2010  . OTHER ACNE 10/16/2009  . PALPITATIONS 02/25/2009  . HIP PAIN, BILATERAL 11/06/2008  . NEVI, MULTIPLE 10/09/2008  . Dysphagia 04/02/2008  . Incontinence of feces 04/02/2008  . Urinary incontinence 04/02/2008  . 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 FACIAL RASH 04/29/2007  . PALSY, INFANTILE CEREBRAL, 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    PHYSICAL THERAPY DISCHARGE SUMMARY  Visits from Start of Care: 6  Current functional level related to goals / functional outcomes: See above for progress towards LTG's   Remaining deficits: Continued UE flexion contractures with very limited PROM of bil. Wrists Continued decr. Strength in trunk, UE's and LE's with decr. Functional mobility Cont. Decreased sitting balance   Education / Equipment: Pt's caregivers have been instructed in PROM for bil. UE's and LE's; also instructed in sitting balance activity for trunk musc. strengthening Plan: Patient agrees to discharge.  Patient goals were partially met. Patient is being discharged due to meeting the stated rehab goals.  ?????       Alda Lea, PT 03/19/2017, 4:57 PM  [facility] [address] [place], [place], [address] Phone: [phone]   Fax:  [phone]  Name: [name] MRN: [id number] Date of Birth: [birth year]

## 2017-03-23 DIAGNOSIS — F339 Major depressive disorder, recurrent, unspecified: Secondary | ICD-10-CM | POA: Diagnosis not present

## 2017-04-06 ENCOUNTER — Ambulatory Visit: Payer: Medicare Other | Attending: Family Medicine | Admitting: Occupational Therapy

## 2017-04-06 ENCOUNTER — Encounter: Payer: Self-pay | Admitting: Occupational Therapy

## 2017-04-06 DIAGNOSIS — R29818 Other symptoms and signs involving the nervous system: Secondary | ICD-10-CM | POA: Diagnosis not present

## 2017-04-06 DIAGNOSIS — R29898 Other symptoms and signs involving the musculoskeletal system: Secondary | ICD-10-CM | POA: Insufficient documentation

## 2017-04-06 IMAGING — XA IR REPLC DUODEN/JEJUNO TUBE PERCUT W/FLUORO
1 series · 1 of 1 positions shown · non-contrast
Comparison: none

INDICATION: CEREBRAL PALSY, CHRONIC GJ FEEDING TUBE, OCCLUDED

[Series 300: tube placements · 1 of 1 slices shown]
[im 1/1]
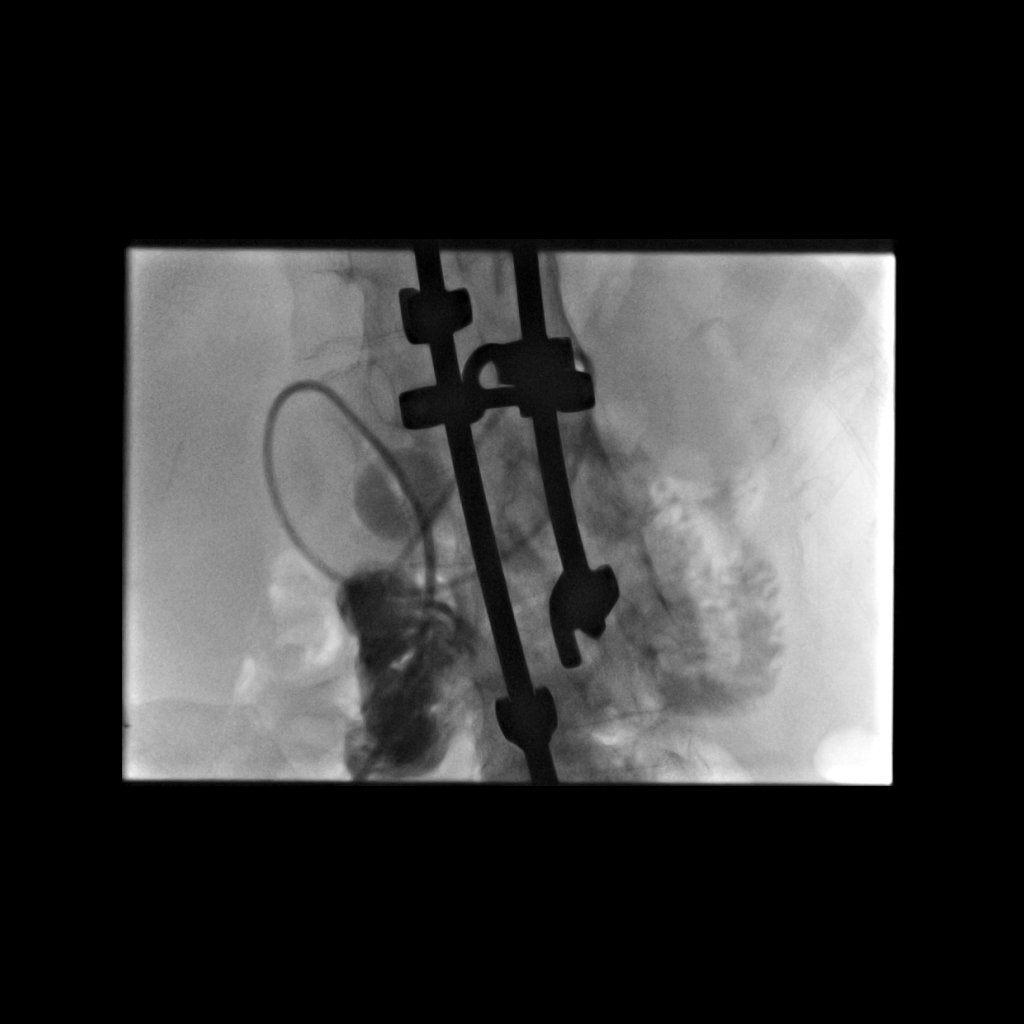

[1 of 1 positions shown; findings below may reference images not displayed]

EXAM:
IR [place]PLACE DUODEN/JEJUNO TUBE PERCUT WITH FLOURO

MEDICATIONS:
None.

ANESTHESIA/SEDATION:
None.

CONTRAST:  10 cc Isovue 300 - administered into the gastric lumen.

FLUOROSCOPY TIME:  Fluoroscopy Time: 3 minutes 30 seconds (17 mGy).

COMPLICATIONS:
None immediate.

PROCEDURE:
Informed written consent was obtained from the patient after a
thorough discussion of the procedural risks, benefits and
alternatives. All questions were addressed. Maximal Sterile Barrier
Technique was utilized including caps, mask, sterile gowns, sterile
gloves, sterile drape, hand hygiene and skin antiseptic. A timeout
was performed prior to the initiation of the procedure.

Under sterile conditions, the existing occluded GJ tube was removed.
Kumpe catheter and Glidewire were utilized to manipulate the access
into the proximal jejunum. Over a stiff Glidewire, a new 24 French
balloon retention gastrostomy was advanced with the tip position in
the proximal jejunum. The G-tube weighted tip was removed. Contrast
injection confirms position. Access ready for use.
IMPRESSION: Successful fluoroscopic exchange of the 24 French GJ tube. Ready for
use.

## 2017-04-06 NOTE — Therapy (Signed)
[facility] [address] [place], [place], [address] Phone: [phone]   Fax:  [phone]  Occupational Therapy Evaluation  Patient Details  Name: [name] MRN: [id number] Date of Birth: 11-12-1985 Referring Provider: Dr. Naaman Plummer  Encounter Date: 04/06/2017      OT End of Session - 04/06/17 1629    Visit Number 1   Number of Visits 5   Date for OT Re-Evaluation 05/04/17   Authorization Type medicare will need PN and G code every 10th visit   Authorization Time Period 60 days   Authorization - Visit Number 1   Authorization - Number of Visits 5   OT Start Time 3154   OT Stop Time 1220   OT Time Calculation (min) 33 min   Activity Tolerance Patient tolerated treatment well      Past Medical History:  Diagnosis Date  . Cerebral palsy ([place])   . Dehydration 11/22/2013  . Depression with anxiety 08/01/2010   Qualifier: Diagnosis of  By: Nelson-Smith CMA (AAMA), Dottie    . Esophagitis 2011  . Gastrostomy in place [facility]) 08/31/2013  . GERD (gastroesophageal reflux disease)   . Hyperlipidemia, mild 08/25/2015  . Hyperthyroidism   . Incontinence of feces   . Loss of weight 08/28/2014  . Medicare annual wellness visit, subsequent 08/25/2015  . Mildly underweight adult 03/16/2017  . Palpitations   . Skin lesion of right ear 03/16/2017    Past Surgical History:  Procedure Laterality Date  . baclofen trial    . baslofen pump implant    . ears tubes    . EYE SURGERY    . FLEXIBLE SIGMOIDOSCOPY N/A 09/07/2014   Procedure: FLEXIBLE SIGMOIDOSCOPY;  Surgeon: [name], MD;  Location: [facility] ENDOSCOPY;  Service: Endoscopy;  Laterality: N/A;  . g-tube insert  August 2006  . hamstring released     to treat contractures.   Marland Kitchen HIP SURGERY     x2 , side   . IR GENERIC HISTORICAL  07/01/2016   IR GASTR TUBE CONVERT GASTR-JEJ PER W/FL MOD SED 07/01/2016 [name], MD [facility]  . IR GENERIC HISTORICAL  07/08/2016    IR PATIENT EVAL TECH 0-60 MINS 07/08/2016 [name], MD [facility]  . IR GENERIC HISTORICAL  07/14/2016   IR GJ TUBE CHANGE 07/14/2016 [name], MD [facility]  . IR GENERIC HISTORICAL  07/21/2016   IR PATIENT EVAL TECH 0-60 MINS [facility]  . IR GENERIC HISTORICAL  08/31/2016   IR [place] DUODEN/JEJUNO TUBE PERCUT W/FLUORO 08/31/2016 [name], MD [facility]  . IR GENERIC HISTORICAL  09/03/2016   IR GJ TUBE CHANGE 09/03/2016 [name], MD [facility]  . IR GENERIC HISTORICAL  09/10/2016   IR GASTR TUBE CONVERT GASTR-JEJ PER W/FL MOD SED 09/10/2016 [facility]  . IR GENERIC HISTORICAL  09/16/2016   IR PATIENT EVAL TECH 0-60 MINS [facility]  . IR GENERIC HISTORICAL  09/29/2016   IR GJ TUBE CHANGE 09/29/2016 [name], MD [facility]  . IR GJ TUBE CHANGE  02/05/2017  . PEG PLACEMENT  10/21/2011   Procedure: PERCUTANEOUS ENDOSCOPIC GASTROSTOMY (PEG) REPLACEMENT;  Surgeon: [name], MD;  [facility]OS[facility];  Service: Endoscopy;  Laterality: N/A;  . PEG PLACEMENT N/A 06/13/2013   Procedure: PERCUTANEOUS ENDOSCOPIC GASTROSTOMY (PEG) REPLACEMENT;  Surgeon: [name], MD;  [facility] ENDOSCOPY;  Service: Endoscopy;  Laterality: N/A;  . SPINAL FUSION    . spinal fusion to correct 70  degree kyphosis  11-2010  . spinal fusioncorrect 106 degree kyphosis    . SPINE SURGERY  ,11/20/2010, 2011   for correction of severe contracturing spinal kyphosis.   . TONSILLECTOMY      There were no vitals filed for this visit.      Subjective Assessment - 04/06/17 1150    Patient is accompained by: Family member  Mother [name], [place] Quillian Quince   Currently in Pain? No/denies           [facility] OT Assessment - 04/06/17 0001      Assessment   Diagnosis spastic quadraparesis   Referring Provider Dr. 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      Precautions   Precautions Fall   just finished PT     Prior Function   Level of Independence Needs assistance with ADLs;Needs assistance with homemaking;Needs assistance with transfers;Other (comment)  needs assistance to drive power chair   Comments pt goes to Day Program two days a  week.     ADL   ADL comments Pt is totally dependent for all activities - pt has no functional control or movement in either UE     Mobility   Mobility Status Needs assist   Mobility Status Comments Pt needs assist to drive power chair     Cognition   Overall Cognitive Status History of cognitive impairments - at baseline     Coordination   Gross Motor Movements are Fluid and Coordinated No   Fine Motor Movements are Fluid and Coordinated No   Other Pt with spastic quadraplegia and no functional use of UE's     Tone   Assessment Location Right Upper Extremity;Left Upper Extremity     ROM / Strength   AROM / PROM / Strength PROM     PROM   Overall PROM  Deficits   Overall PROM Comments Pt with approximately 50* of shoulder flexion, 80* of abduction and flexed at 90* of elbow flexion BUE's.  PT very limited in supination/pronation in BUE's.  Pt with severe wrist flexion contracture in both wrists, R greater than L.  Pt also with limited PROM for finger flexion in both hands due to contractions and both thumbs with signiifcant adduction and very limted passive abduction.  WIll further asses if splints may be helpful however mom states pt has not been compliant in the past wearing splints.      Hand Function   Comment PT has no functional use of either hand     RUE Tone   RUE Tone Severe     LUE Tone   LUE Tone Severe                              OT Long Term Goals - 04/06/17 1307      OT LONG TERM GOAL #1   Title Pt's caregiver and mom will be independent in HEP - 05/04/2017   Status New     OT LONG TERM GOAL #2   Title Caregiver and mom will be independent in splint wear and care if indicated (will  need to further assess given pt's poor tolerance in the past).    Status New               Plan - 04/06/17 1309    Clinical  Impression Statement Pt is a 31 year old male with spastic quadraplegia who is s/p botox injection on to [place] 03/02/2017. Pt was referred following botox injection.  Pt presents with the following deficts that place pt at risk for futher contracture, pain and skin breakdown (pt is dependent for all actvities): severe spastic quadraplegia, stiffness of BUE's, pain with PROM.  Pt will benefit from short course of OT following botox injection to formalize HEP for stretching, education to caregivers and assessment if possible splints.    Occupational performance deficits (Please refer to evaluation for details): ADL's;IADL's;Rest and Sleep;Education;Work;Play;Leisure;Social Participation   Rehab Potential Fair   Current Impairments/barriers affecting progress: severty of deficits.   OT Frequency 1x / week   OT Duration 4 weeks   OT Treatment/Interventions Passive range of motion;Splinting;Patient/family education;DME and/or AE instruction   Plan instruct mom and caregiver in HEP for passive stretching to BUE's in chair   Consulted and Agree with Plan of Care Patient;Family member/caregiver   Family Member Consulted mom      Patient will benefit from skilled therapeutic intervention in order to improve the following deficits and impairments:  Decreased range of motion, Impaired UE functional use, Impaired tone, Pain  Visit Diagnosis: Other symptoms and signs involving the nervous system - Plan: Ot plan of care cert/re-cert  Other symptoms and signs involving the musculoskeletal system - Plan: Ot plan of care cert/re-cert      G-Codes - 24/40/10 1631    Functional Assessment Tool Used (Outpatient only) skilled clinical observation pt unable to particpate in any standardized tests   Functional Limitation Self care  family's ability to care for pt post botox  injection   Self Care Current Status (U7253) At least 20 percent but less than 40 percent impaired, limited or restricted   Self Care Goal Status (G6440) At least 1 percent but less than 20 percent impaired, limited or restricted      Problem List Patient Active Problem List   Diagnosis Date Noted  . Mildly underweight adult 03/16/2017  . Skin lesion of right ear 03/16/2017  . Cerebral palsy ([place]) 04/14/2016  . Increased oropharyngeal secretions 04/14/2016  . Medicare annual wellness visit, subsequent 08/25/2015  . Hyperlipidemia, mild 08/25/2015  . Reflux 12/17/2014  . Rectal bleeding 09/06/2014  . Esophageal dysphagia 09/06/2014  . Dysphagia, pharyngoesophageal phase 09/02/2014  . Impetigo 09/02/2014  . Abnormal thyroid function test 06/10/2014  . Acute nonsuppurative otitis media of right ear 05/13/2014  . Acute bronchitis 02/27/2014  . Protein-calorie malnutrition, severe ([place]) 11/23/2013  . Decreased oral intake 11/22/2013  . Gastrostomy in place [facility]) 08/31/2013  . 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 Cervical dystonia 08/23/2013  . Dysphagia, oropharyngeal phase 08/10/2013  . Low back pain 07/11/2013  . 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 Anemia 12/05/2010  . 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 GASTROSTOMY COMPLICATION 34/74/2595  . Infantile cerebral palsy ([place]) 08/04/2010  . Thyrotoxicosis 08/01/2010  . Depression with anxiety 08/01/2010  . GERD 08/01/2010  . OTHER ACNE 10/16/2009  . PALPITATIONS 02/25/2009  . HIP PAIN, BILATERAL 11/06/2008  . NEVI, MULTIPLE 10/09/2008  . Dysphagia 04/02/2008  . Incontinence of feces 04/02/2008  . Urinary incontinence 04/02/2008  . 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 FACIAL RASH 04/29/2007  . PALSY, INFANTILE CEREBRAL, 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    Quay Burow, OTR/L 04/06/2017, 4:40 PM  [facility] [address] [place], [place],  [address] Phone: [phone]   Fax:  [phone]  Name: [name] MRN: [id number] Date of Birth: 01-12-1986

## 2017-04-08 IMAGING — DX DG CHEST 2V
2 series · 2 of 2 positions shown · non-contrast
Comparison: 05/12/2016

CLINICAL DATA: Cough, congestion since [REDACTED]

EXAM:
CHEST  2 VIEW

[chest lat]
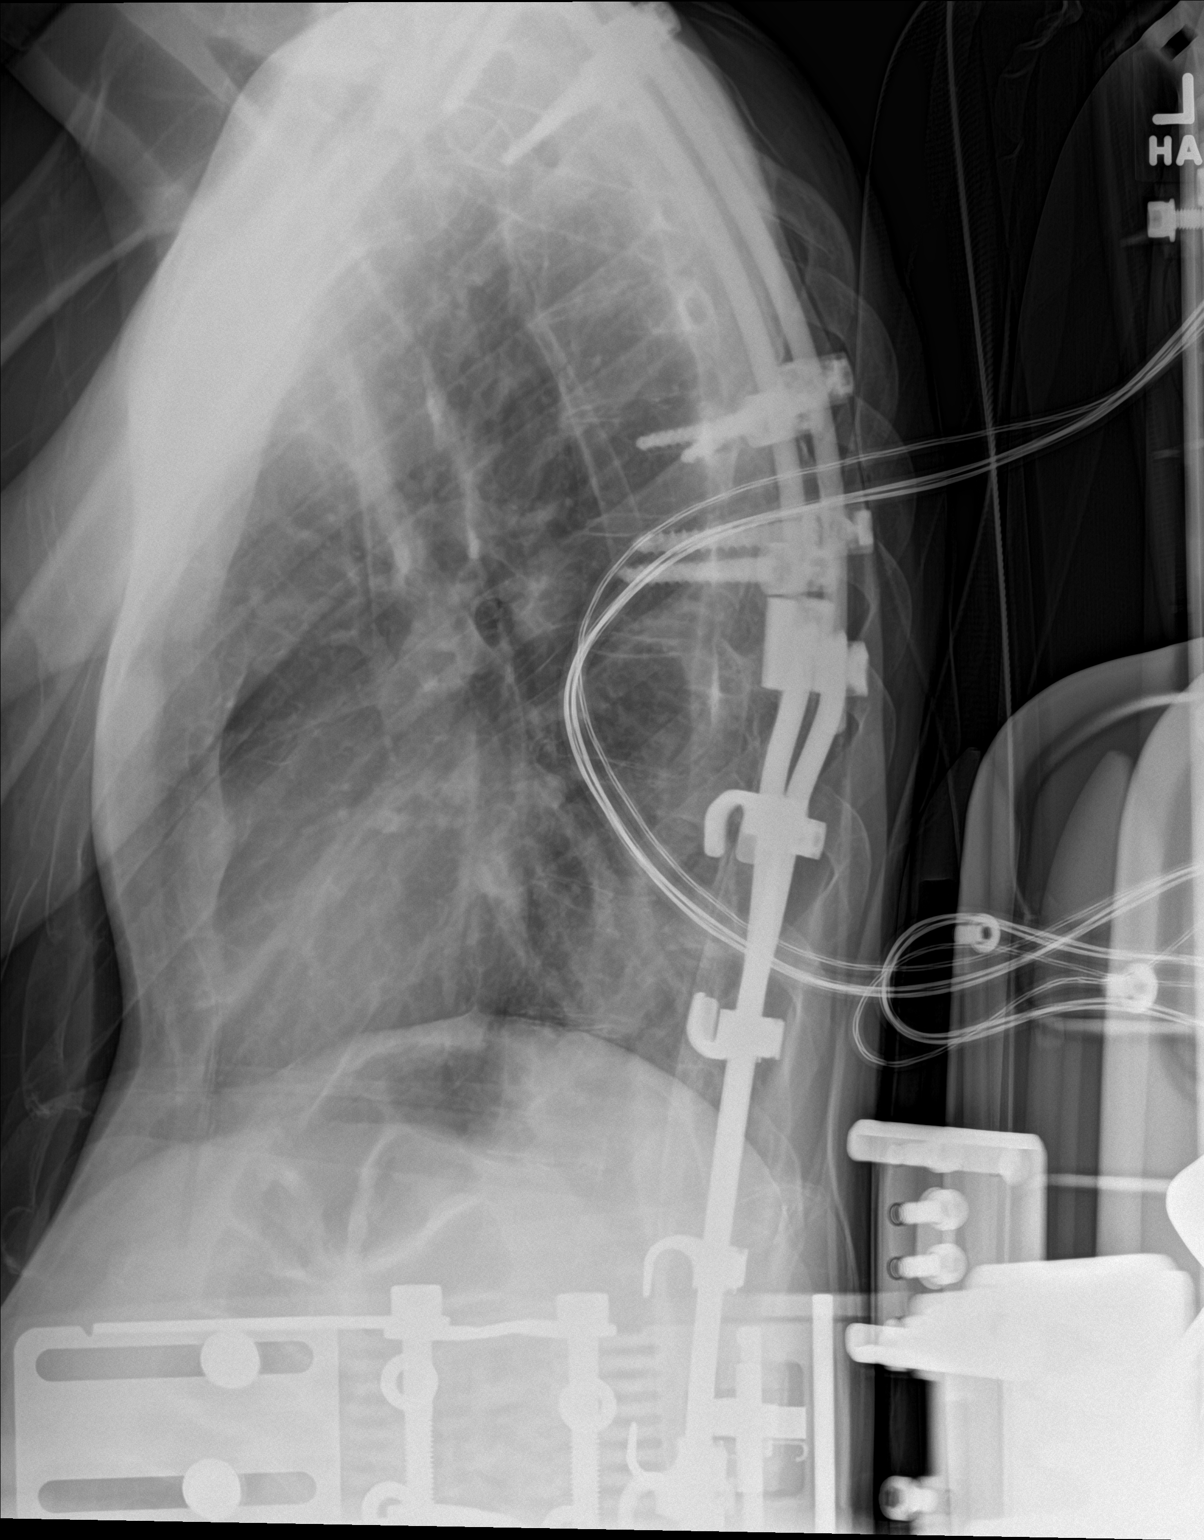

[chest ap]
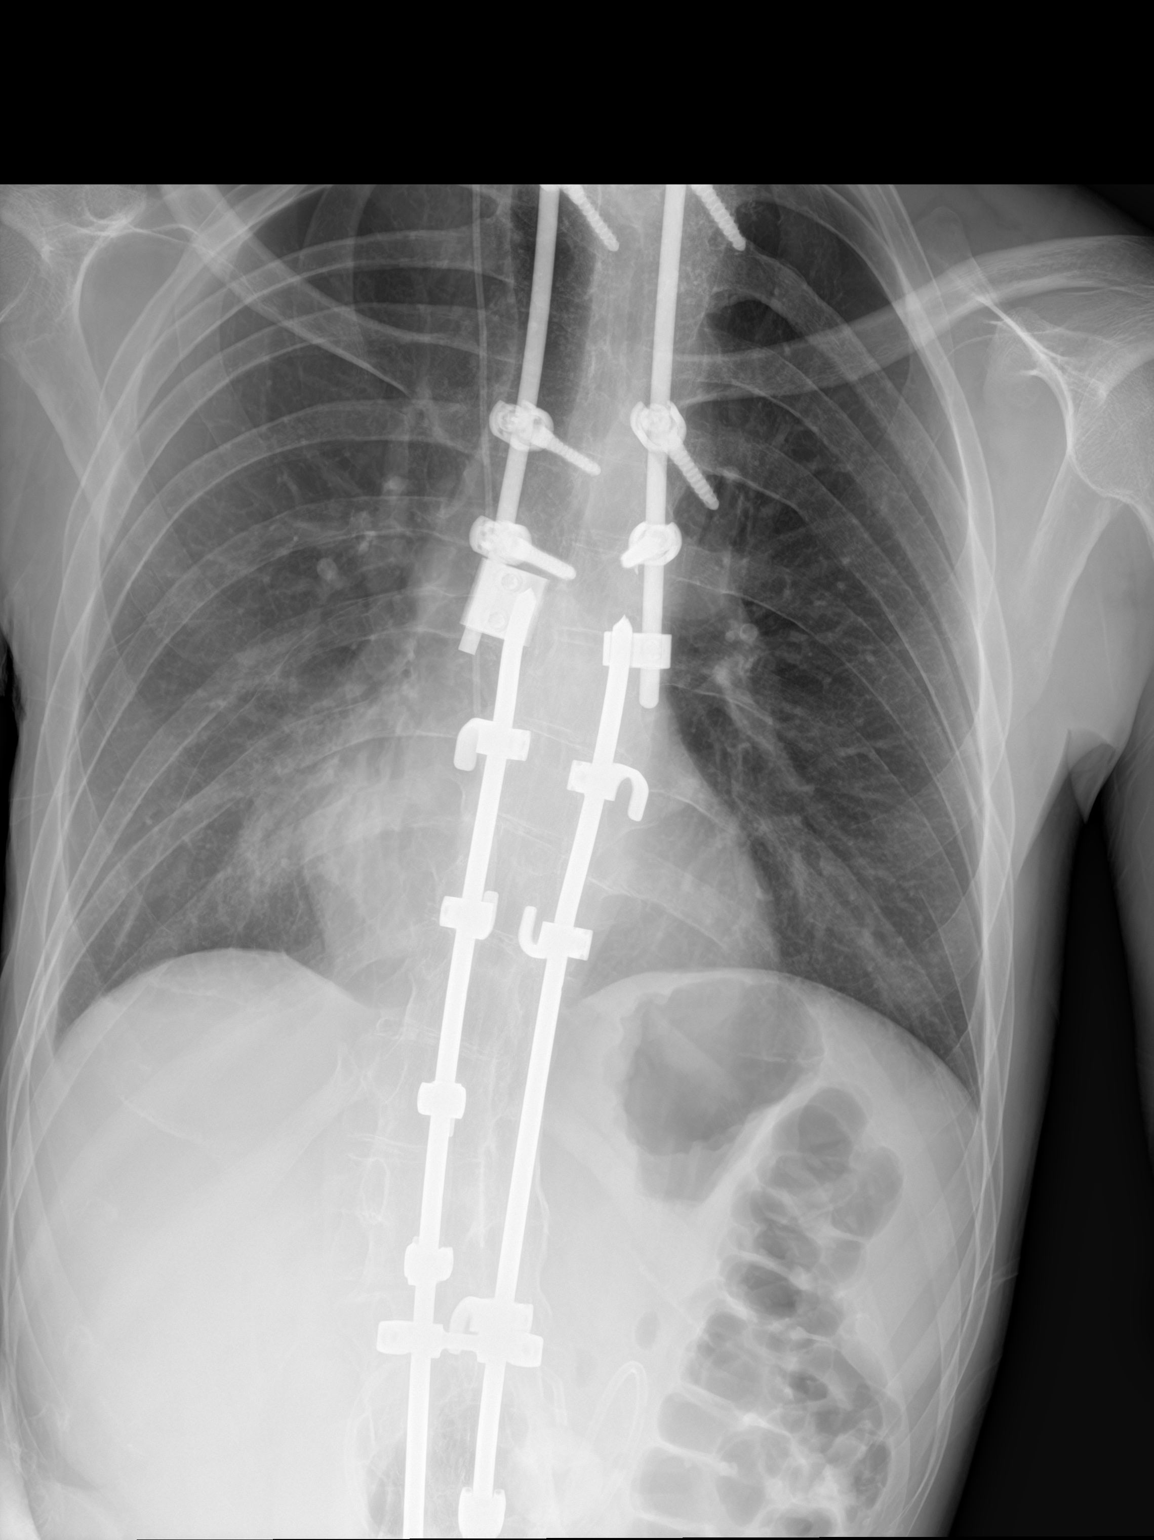

[2 of 2 positions shown; findings below may reference images not displayed]

FINDINGS: There is persistent airspace opacity in the medial right lower lung
unchanged compared with prior CT chest 01/16/2015 demonstrating an
area of round atelectasis. There is no new focal parenchymal
opacity. There is a small right pleural effusion. There is no left
pleural effusion. There is no pneumothorax. The heart and
mediastinal contours are unremarkable.

There are posterior spinal fixation rods of the thoracolumbar spine.
IMPRESSION: 1. No active cardiopulmonary disease.
2. Chronic airspace opacity in the medial right lower lobe likely
reflecting round atelectasis with a small right pleural effusion.

## 2017-04-09 IMAGING — XA IR REPLC DUODEN/JEJUNO TUBE PERCUT W/FLUORO
1 series · 4 of 4 positions shown · non-contrast
Comparison: Fluoroscopic guided gastric gastrojejunostomy catheter
exchange - 08/31/2016;

INDICATION: Inadvertent removal of gastrojejunostomy feeding tube

EXAM:
FLUOROSCOPIC GUIDED REPLACEMENT OF GASTROJEJUNOSTOMY TUBE

[Series 100: ir replc duoden/jejuno tube percut w/flu · 4 of 4 slices shown]
[im 1/4]
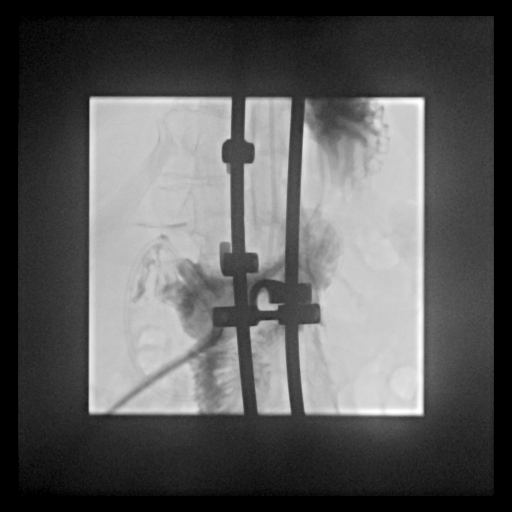
[im 2/4]
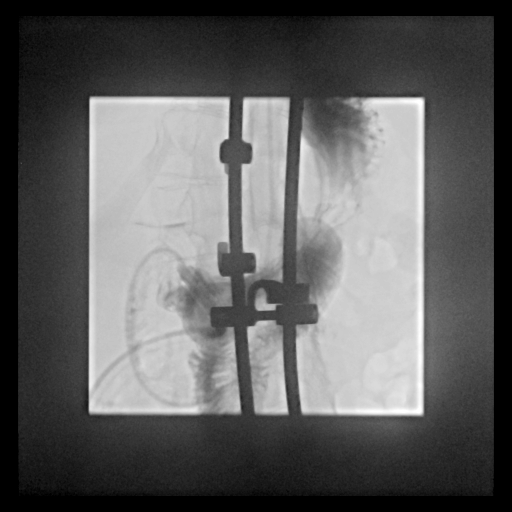
[im 3/4]
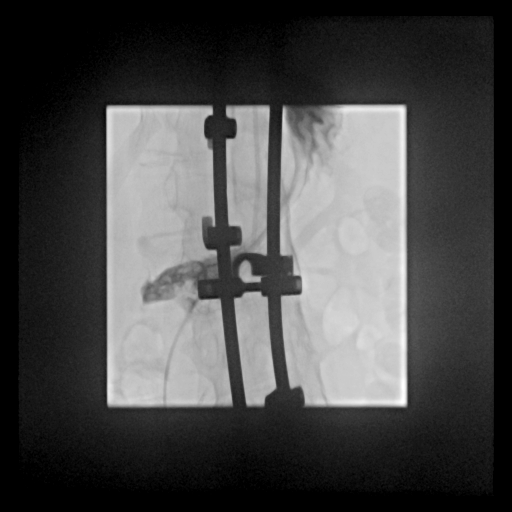
[im 4/4]
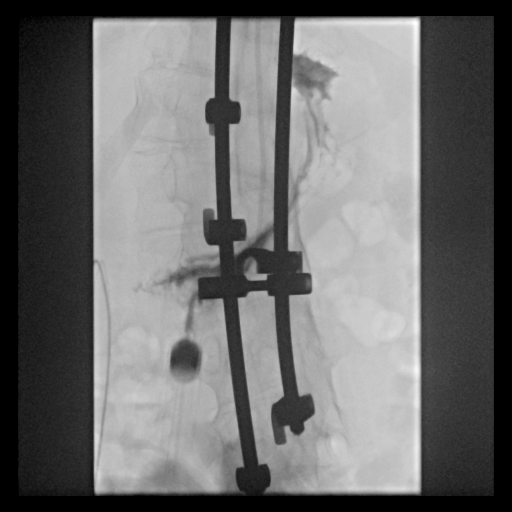

[4 of 4 positions shown; findings below may reference images not displayed]

07/14/2016

MEDICATIONS:
None.

CONTRAST:  25mL A0023U-6RR IOPAMIDOL (A0023U-6RR) INJECTION 61% -
administered into the gastric lumen.

FLUOROSCOPY TIME:  8 minutes (41 mGy)

COMPLICATIONS:
None.

PROCEDURE:
The upper abdomen and the external portion of the existing
gastrojejunostomy tube was prepped and draped in the usual sterile
fashion, and a sterile drape was applied covering the operative
field. Maximum barrier sterile technique with sterile gowns and
gloves were used for the procedure. A timeout was performed prior to
the initiation of the procedure.

The track of the chronic gastrostomy catheter was cannulated with [REDACTED] tree adapter with contrast injection demonstrating patency
of the gastrostomy tract. Under fluoroscopic guidance, a Kumpe
catheter was utilized to manipulate a stiff glide wire to the level
of the proximal jejunum. Contrast injection confirmed appropriate
positioning.

Under intermittent fluoroscopic guidance, a new 24 French balloon
retention gastrostomy catheter was advanced over the stiff Glidewire
with tip ultimately terminating within the proximal small bowel.
Note, the distal end of the gastrostomy catheter was trimmed by
approximately 10 cm as had been done previously. Contrast injection
via the jejunostomy and gastric lumens confirmed appropriate
functioning and positioning.

A dressing was placed. The patient tolerated procedure well without
immediate postprocedural complication.
IMPRESSION: Successful fluoroscopic guided replacement of a new 24 French
balloon retention gastrojejunostomy tube. The tip of the jejunostomy
lumen lies within the proximal jejunum. Both lumens ready for
immediate use.

## 2017-04-13 ENCOUNTER — Encounter: Payer: Self-pay | Admitting: Occupational Therapy

## 2017-04-16 IMAGING — XA IR CONVERT G-TUBE TO G-JTUBE
1 series · 8 of 8 positions shown · non-contrast
Comparison: Fluoroscopic guided replacement of gastrojejunostomy
catheter - 09/03/2016;

INDICATION: History of chronic gastrojejunostomy catheter. Unfortunately, the
patient's gastrojejunostomy catheter has been frequently clogged and
as such, request made for conversion of the GJ tube to jejunostomy
tube.

EXAM:
FLUOROSCOPIC GUIDED CONVERSION OF GASTROJEJUNOSTOMY CATHETER TO A
JEJUNOSTOMY CATHETER

[Series 300: ir replc gastro/colonic tube percut w/fl · 8 of 8 slices shown]
[im 1/8]
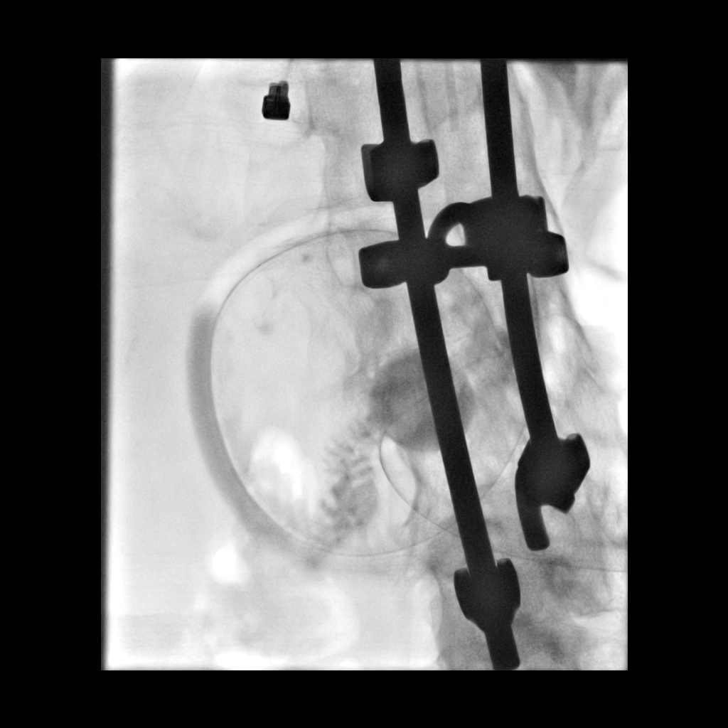
[im 2/8]
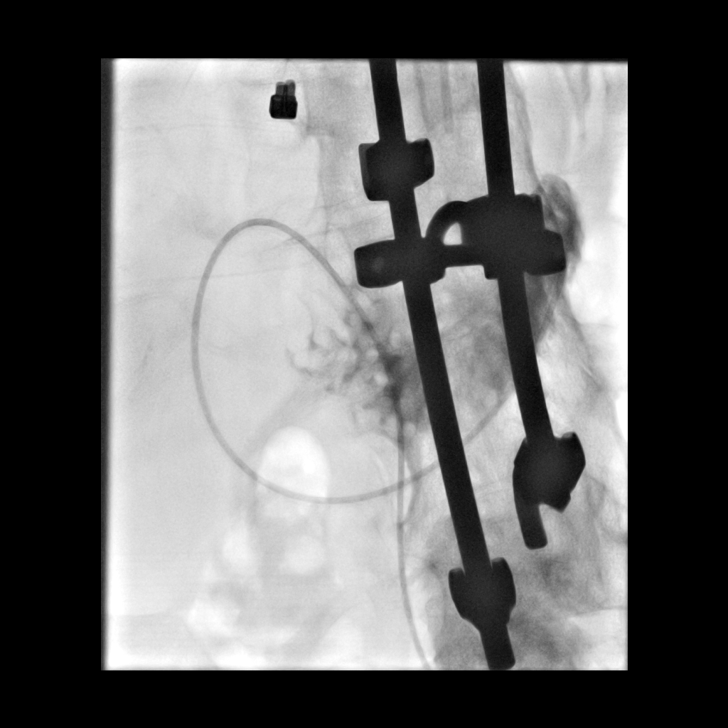
[im 3/8]
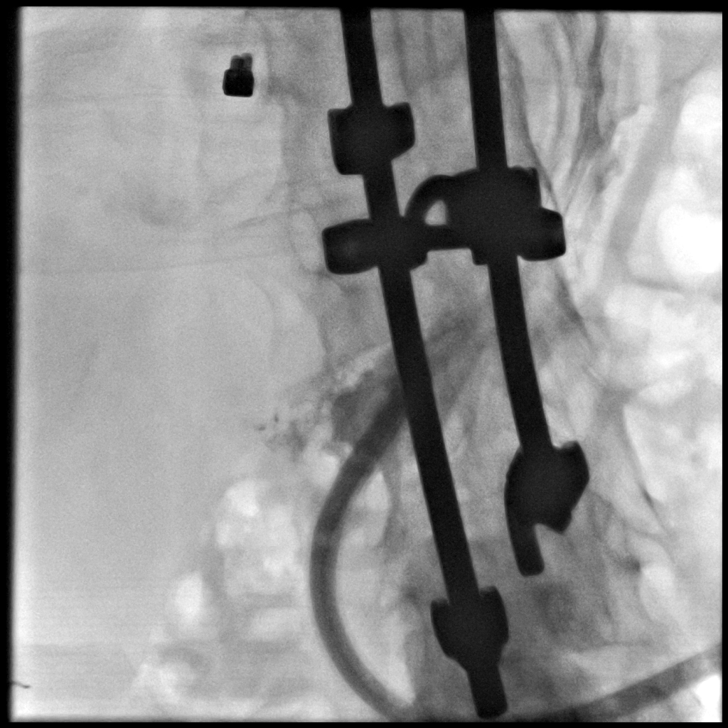
[im 4/8]
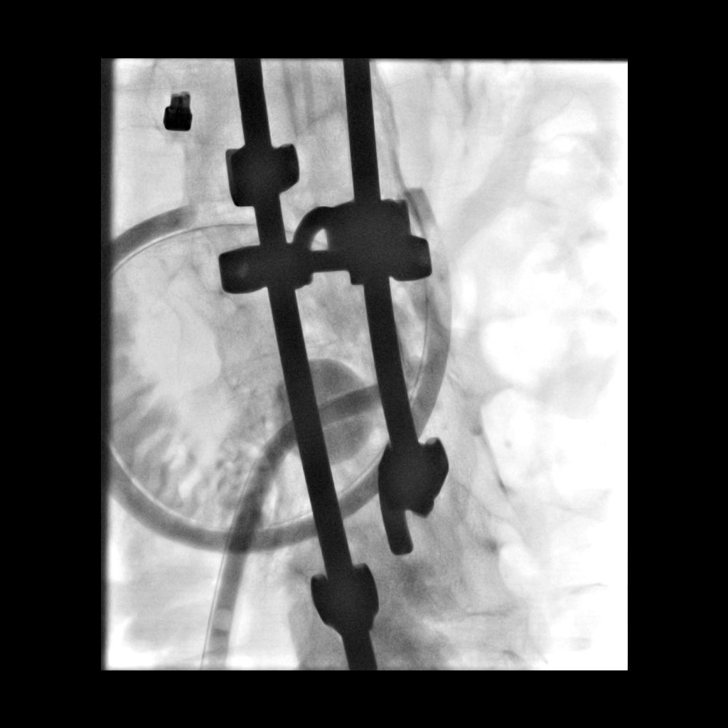
[im 5/8]
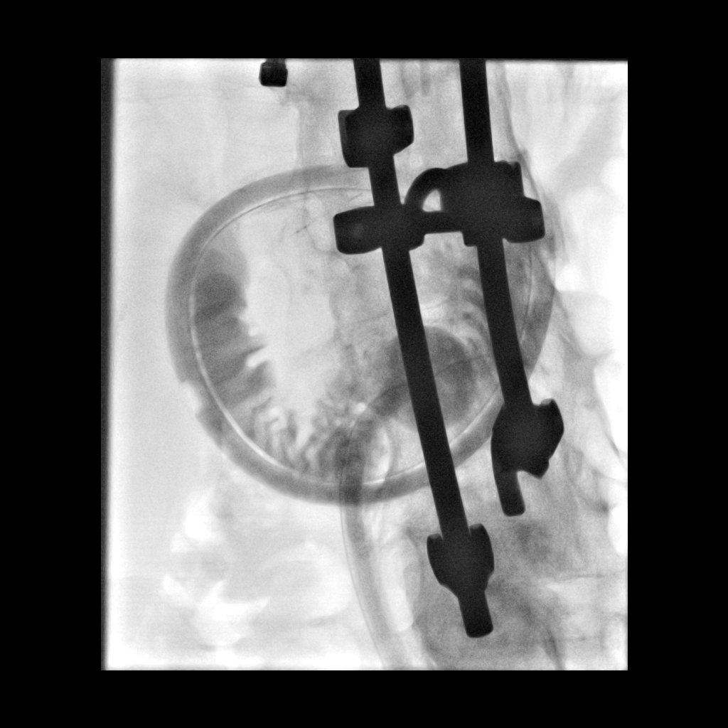
[im 6/8]
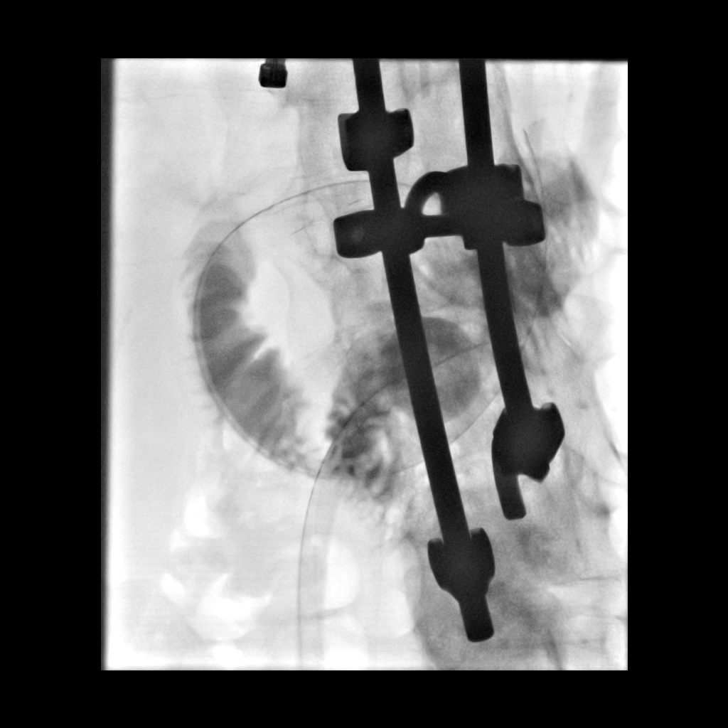
[im 7/8]
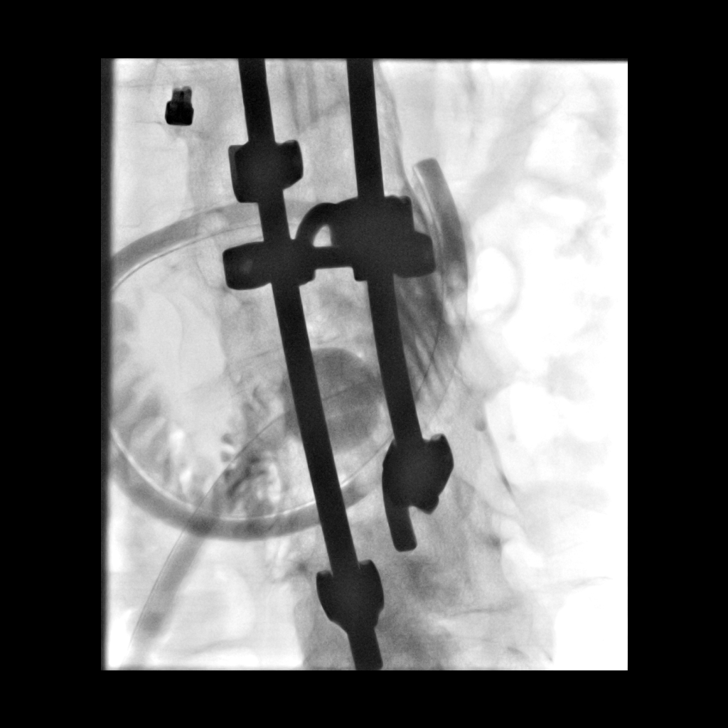
[im 8/8]
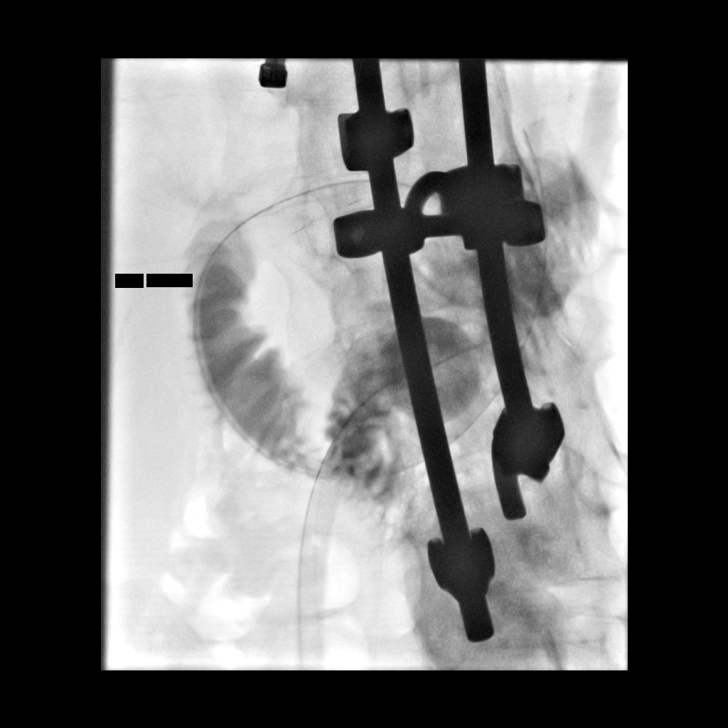

[8 of 8 positions shown; findings below may reference images not displayed]

08/31/2026

MEDICATIONS:
None.

CONTRAST:  15 cc Isovue 300 - administered into enteric lumen.

FLUOROSCOPY TIME:  2 minutes 12 seconds (68 mGy)

COMPLICATIONS:
None.

PROCEDURE:
The upper abdomen and the external portion of the existing
gastrostomy tube (the patient's mother removed the gastrojejunostomy
catheter for a gastrostomy catheter as the jejunal limb was clogged)
was prepped and draped in the usual sterile fashion, and a sterile
drape was applied covering the operative field. Maximum barrier
sterile technique with sterile gowns and gloves were used for the
procedure. A timeout was performed prior to the initiation of the
procedure.

Small amount of contrast was injected via the existing gastrostomy
catheter.

The existing gastrostomy catheter retention balloon was deflated and
removed intact. A Kumpe catheter was advanced through the
gastrostomy tract and with the use of a stiff Glidewire, manipulated
to the ascending portion of the duodenum. Contrast injection
confirmed appropriate positioning.

At this time, under intermittent fluoroscopic guidance, the Kumpe
catheter was exchanged for a new 24 French balloon retention
jejunostomy catheter with end ultimately terminating regional to the
ligament of [place]. Contrast injection confirmed appropriate
positioning.

A dressing was placed. The patient tolerated procedure well without
immediate postprocedural complication.
IMPRESSION: Successful fluoroscopic guided conversion of a gastrojejunostomy
catheter to a 24 French balloon retention jejunostomy catheter. The
jejunostomy catheter is ready for immediate use.

## 2017-05-04 ENCOUNTER — Ambulatory Visit: Payer: Medicare Other | Attending: Family Medicine | Admitting: Occupational Therapy

## 2017-05-04 ENCOUNTER — Encounter: Payer: Self-pay | Admitting: Occupational Therapy

## 2017-05-04 DIAGNOSIS — R29898 Other symptoms and signs involving the musculoskeletal system: Secondary | ICD-10-CM | POA: Diagnosis not present

## 2017-05-04 DIAGNOSIS — M6281 Muscle weakness (generalized): Secondary | ICD-10-CM | POA: Diagnosis not present

## 2017-05-04 DIAGNOSIS — R29818 Other symptoms and signs involving the nervous system: Secondary | ICD-10-CM | POA: Diagnosis not present

## 2017-05-04 NOTE — Patient Instructions (Signed)
Home PROM/stretching/tone reduction for BUE's.

## 2017-05-04 NOTE — Therapy (Signed)
[facility] [address] [place], [place], [address] Phone: [phone]   Fax:  [phone]  Occupational Therapy Treatment  Patient Details  Name: [name] MRN: [id number] Date of Birth: [birth year] Referring Provider: Dr. Naaman Plummer  Encounter Date: 05/04/2017      OT End of Session - 05/04/17 1638    Visit Number 2   Number of Visits 5   Date for OT Re-Evaluation 05/04/17   Authorization Type medicare will need PN and G code every 10th visit   Authorization Time Period 60 days   Authorization - Visit Number 2   Authorization - Number of Visits 5   OT Start Time 3295   OT Stop Time 1358   OT Time Calculation (min) 43 min   Activity Tolerance Patient tolerated treatment well      Past Medical History:  Diagnosis Date  . Cerebral palsy ([place])   . Dehydration 11/22/2013  . Depression with anxiety 08/01/2010   Qualifier: Diagnosis of  By: Nelson-Smith CMA (AAMA), Dottie    . Esophagitis 2011  . 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 GERD (gastroesophageal reflux disease)   . Hyperlipidemia, mild 08/25/2015  . Hyperthyroidism   . Incontinence of feces   . Loss of weight 08/28/2014  . Medicare annual wellness visit, subsequent 08/25/2015  . Mildly underweight adult 03/16/2017  . Palpitations   . Skin lesion of right ear 03/16/2017    Past Surgical History:  Procedure Laterality Date  . baclofen trial    . baslofen pump implant    . ears tubes    . EYE SURGERY    . FLEXIBLE SIGMOIDOSCOPY N/A 09/07/2014   Procedure: FLEXIBLE SIGMOIDOSCOPY;  Surgeon: [name], MD;  Location: [facility] ENDOSCOPY;  Service: Endoscopy;  Laterality: N/A;  . g-tube insert  August 2006  . hamstring released     to treat contractures.   Marland Kitchen HIP SURGERY     x2 , side   . IR GENERIC HISTORICAL  07/01/2016   IR GASTR TUBE CONVERT GASTR-JEJ PER W/FL MOD SED 07/01/2016 [name], MD [facility]  . IR GENERIC HISTORICAL  07/08/2016    IR PATIENT EVAL TECH 0-60 MINS 07/08/2016 [name], MD [facility]  . IR GENERIC HISTORICAL  07/14/2016   IR GJ TUBE CHANGE 07/14/2016 [name], MD [facility]  . IR GENERIC HISTORICAL  07/21/2016   IR PATIENT EVAL TECH 0-60 MINS [facility]  . IR GENERIC HISTORICAL  08/31/2016   IR [place] DUODEN/JEJUNO TUBE PERCUT W/FLUORO 08/31/2016 [name], MD [facility]  . IR GENERIC HISTORICAL  09/03/2016   IR GJ TUBE CHANGE 09/03/2016 [name], MD [facility]  . IR GENERIC HISTORICAL  09/10/2016   IR GASTR TUBE CONVERT GASTR-JEJ PER W/FL MOD SED 09/10/2016 [facility]  . IR GENERIC HISTORICAL  09/16/2016   IR PATIENT EVAL TECH 0-60 MINS [facility]  . IR GENERIC HISTORICAL  09/29/2016   IR GJ TUBE CHANGE 09/29/2016 [name], MD [facility]  . IR GJ TUBE CHANGE  02/05/2017  . PEG PLACEMENT  10/21/2011   Procedure: PERCUTANEOUS ENDOSCOPIC GASTROSTOMY (PEG) REPLACEMENT;  Surgeon: [name], MD;  Location[facility];  Service: Endoscopy;  Laterality: N/A;  . PEG PLACEMENT N/A 06/13/2013   Procedure: PERCUTANEOUS ENDOSCOPIC GASTROSTOMY (PEG) REPLACEMENT;  Surgeon: [name], MD;  Location[facility] ENDOSCOPY;  Service: Endoscopy;  Laterality: N/A;  . SPINAL FUSION    . spinal fusion to correct 70  degree kyphosis  11-2010  . spinal fusioncorrect 106 degree kyphosis    . SPINE SURGERY  ,11/20/2010, 2011   for correction of severe contracturing spinal kyphosis.   . TONSILLECTOMY      There were no vitals filed for this visit.      Subjective Assessment - 05/04/17 1318    Subjective  NO when asked if stretching was causing any pain.   Patient is accompained by: Family member  mom and primary caregiver   Pertinent History see epic.  Pt s/p botox to both wrists and hands and referred to OT for follow up.   Currently in Pain? No/denies                      OT Treatments/Exercises (OP) - 05/04/17 0001      ADLs   ADL Comments Education regarding  reflexes, positioning and strategies to decrease tone in trunk and extremeties.       Exercises   Exercises Shoulder     Shoulder Exercises: Seated   Other Seated Exercises Demonstrated home program for stretching, tone reduction and preventing further loss of range of motion for BUE"s for shoulders, elbows, wrists and hands.  Caregiver able to return demonstrate after instruction and practice.  Mother videotaped in order to ensure carry over for evening caregivers.                  OT Education - 05/04/17 1629    Education provided Yes   Education Details HEP for UE stretching, ROM and tone reduction in wheelchair.   Person(s) Educated Patient;Parent(s);Caregiver(s)   Methods Explanation;Demonstration;Other (comment)  mother videotaped   Comprehension Verbalized understanding;Returned demonstration  caregiver able to return demonstrate             OT Long Term Goals - 05/04/17 1636      OT LONG TERM GOAL #1   Title Pt's caregiver and mom will be independent in HEP - 05/04/2017   Status On-going     OT LONG TERM GOAL #2   Title Caregiver and mom will be independent in splint wear and care if indicated (will need to further assess given pt's poor tolerance in the past).    Status On-going               Plan - 05/04/17 1636    Clinical Impression Statement Reviewed goals with pt, mom who are in agreement.  Addressed HEP today with mom and caregiver.    Rehab Potential Fair   Current Impairments/barriers affecting progress: severty of deficits.   OT Frequency 1x / week   OT Duration 4 weeks   OT Treatment/Interventions Passive range of motion;Splinting;Patient/family education;DME and/or AE instruction   Plan check on how HEP is going, assess possible splinting to address further wrist contracture and prevent palm breakdown.    Consulted and Agree with Plan of Care Patient;Family member/caregiver   Family Member Consulted mom      Patient will benefit from  skilled therapeutic intervention in order to improve the following deficits and impairments:  Decreased range of motion, Impaired UE functional use, Impaired tone, Pain  Visit Diagnosis: Other symptoms and signs involving the nervous system  Other symptoms and signs involving the musculoskeletal system  Muscle weakness (generalized)    Problem List Patient Active Problem List   Diagnosis Date Noted  . Mildly underweight adult 03/16/2017  . Skin lesion of right ear 03/16/2017  . Cerebral palsy ([place]) 04/14/2016  .  Increased oropharyngeal secretions 04/14/2016  . Medicare annual wellness visit, subsequent 08/25/2015  . Hyperlipidemia, mild 08/25/2015  . Reflux 12/17/2014  . Rectal bleeding 09/06/2014  . Esophageal dysphagia 09/06/2014  . Dysphagia, pharyngoesophageal phase 09/02/2014  . Impetigo 09/02/2014  . Abnormal thyroid function test 06/10/2014  . Acute nonsuppurative otitis media of right ear 05/13/2014  . Acute bronchitis 02/27/2014  . Protein-calorie malnutrition, severe ([place]) 11/23/2013  . Decreased oral intake 11/22/2013  . 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 Cervical dystonia 08/23/2013  . Dysphagia, oropharyngeal phase 08/10/2013  . Low back pain 07/11/2013  . 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 Anemia 12/05/2010  . 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 GASTROSTOMY COMPLICATION 67/11/4101  . Infantile cerebral palsy ([place]) 08/04/2010  . Thyrotoxicosis 08/01/2010  . Depression with anxiety 08/01/2010  . GERD 08/01/2010  . OTHER ACNE 10/16/2009  . PALPITATIONS 02/25/2009  . HIP PAIN, BILATERAL 11/06/2008  . NEVI, MULTIPLE 10/09/2008  . Dysphagia 04/02/2008  . Incontinence of feces 04/02/2008  . Urinary incontinence 04/02/2008  . 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 FACIAL RASH 04/29/2007  . PALSY, INFANTILE CEREBRAL, 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    Quay Burow, OTR/L 05/04/2017, 4:39 PM  [name] [address] [place], [place], [address] Phone: [phone]   Fax:  [phone]  Name: [name] MRN: [id number] Date of Birth: 03-08-1986

## 2017-05-05 IMAGING — XA IR REPLACE G/J TUBE W/ FLUORO
3 series · 5 of 5 positions shown · non-contrast
Comparison: none

INDICATION: Recurrent occlusion of single-lumen 24 French gastrojejunostomy
catheter.

[Series 1: care single · 1 of 1 slices shown (1 of 2)]
[im 1/1]
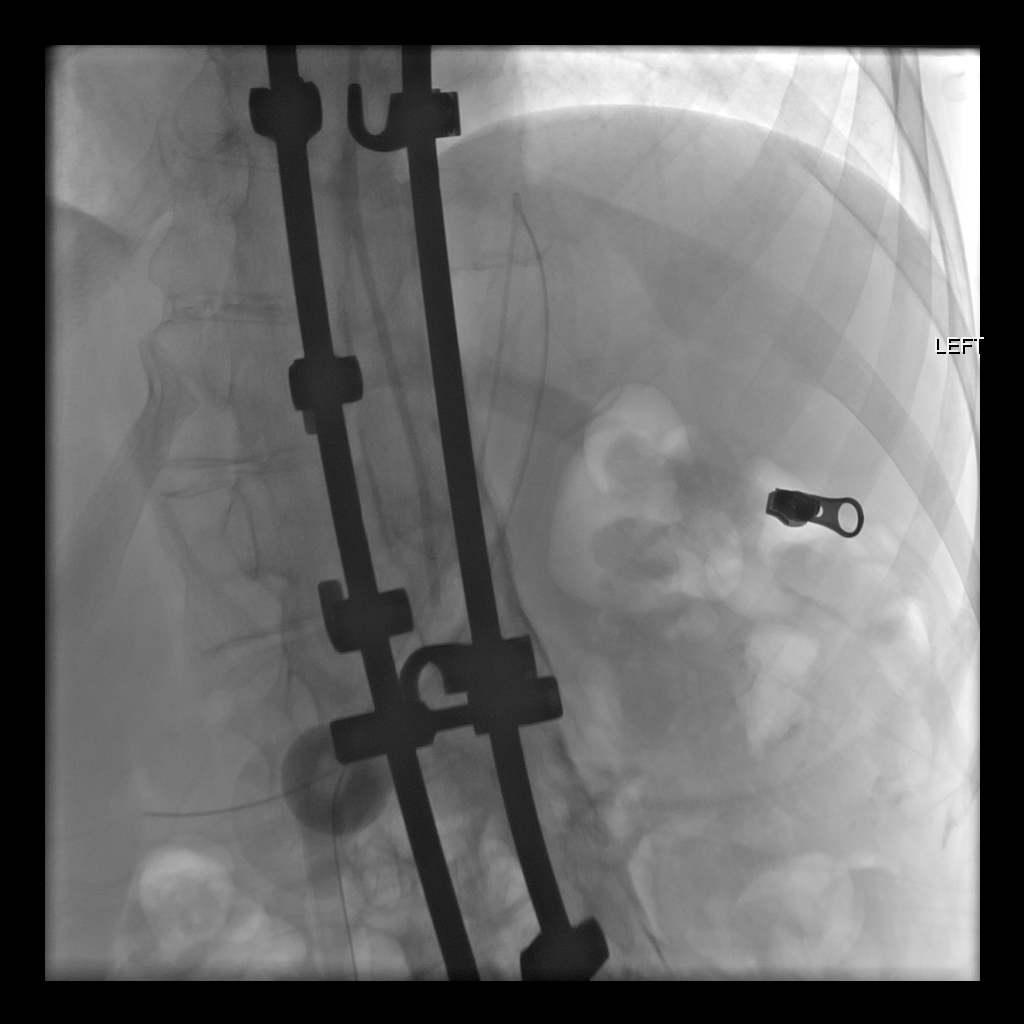

[Series 2: care single · 1 of 1 slices shown (2 of 2)]
[im 1/1]
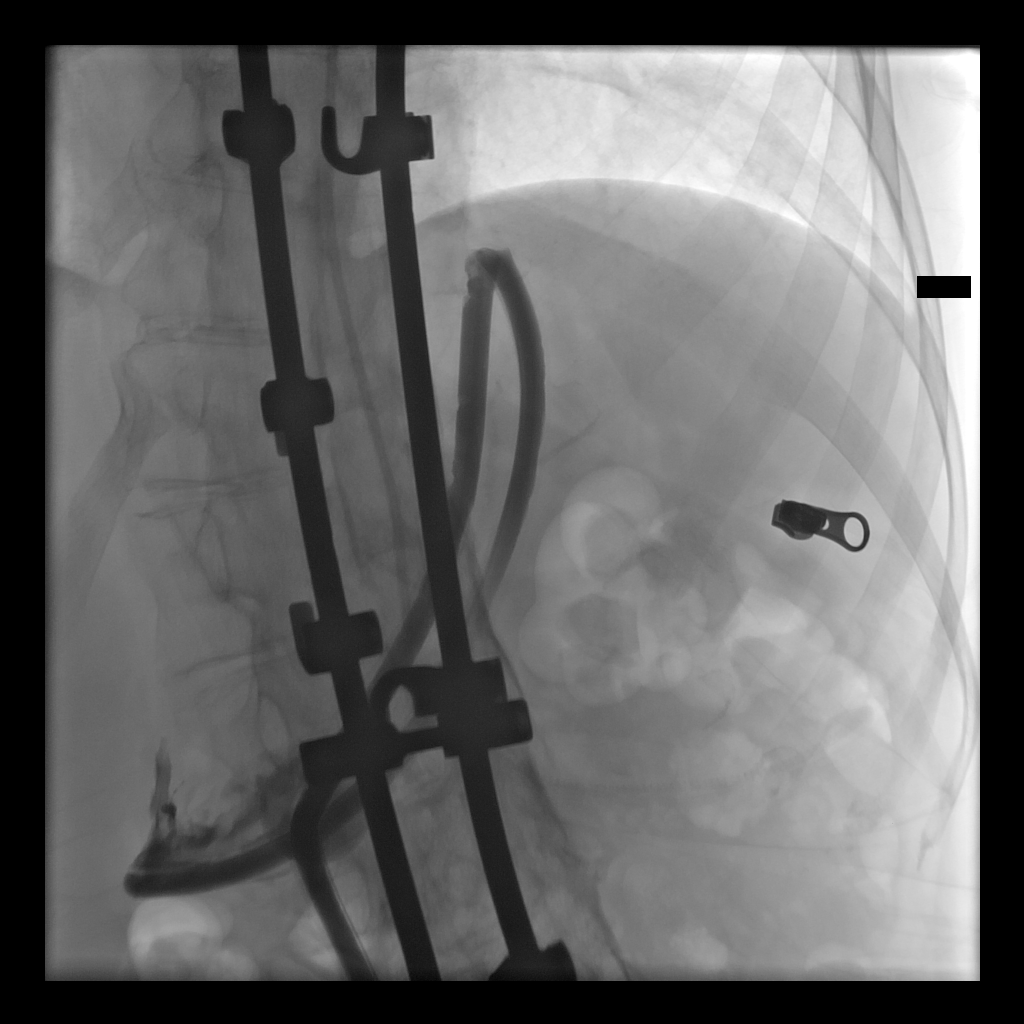

[Series 300: line placements · 3 of 3 slices shown]
[im 1/3]
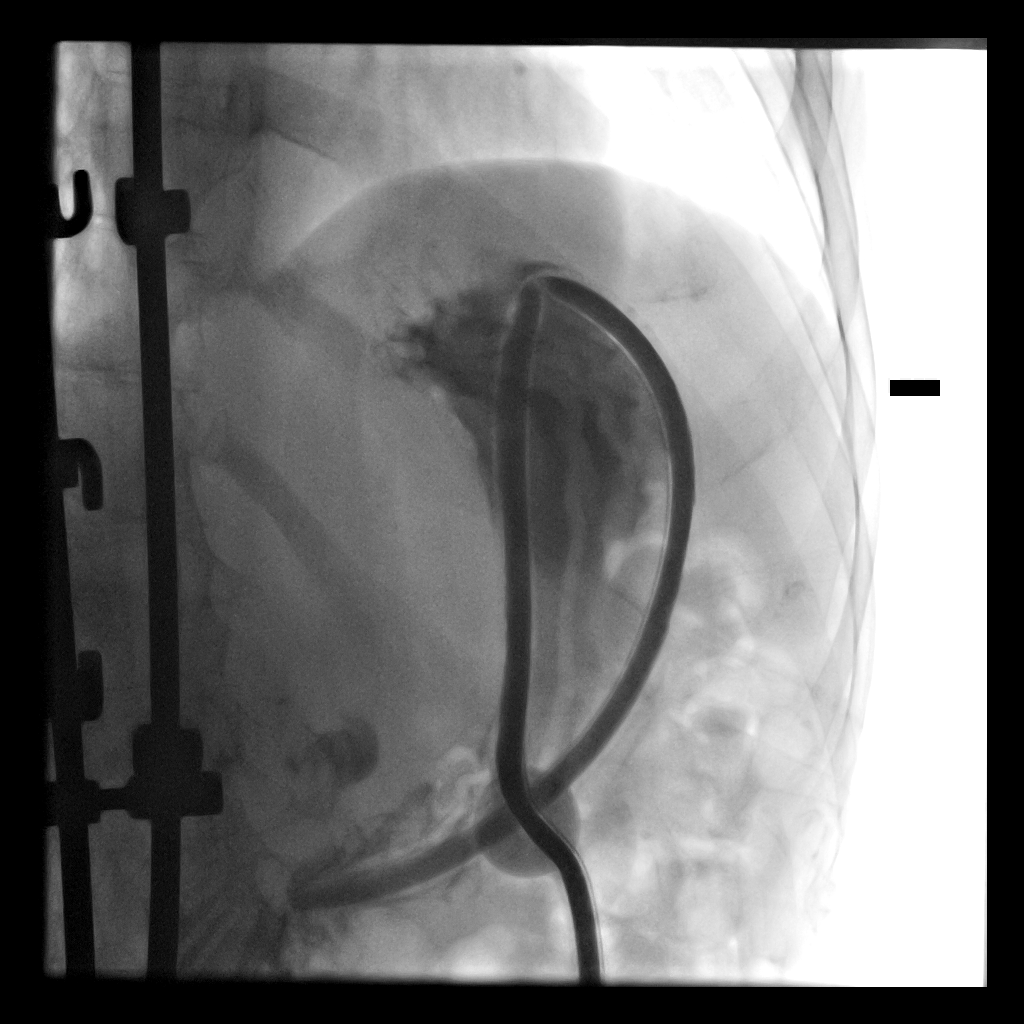
[im 2/3]
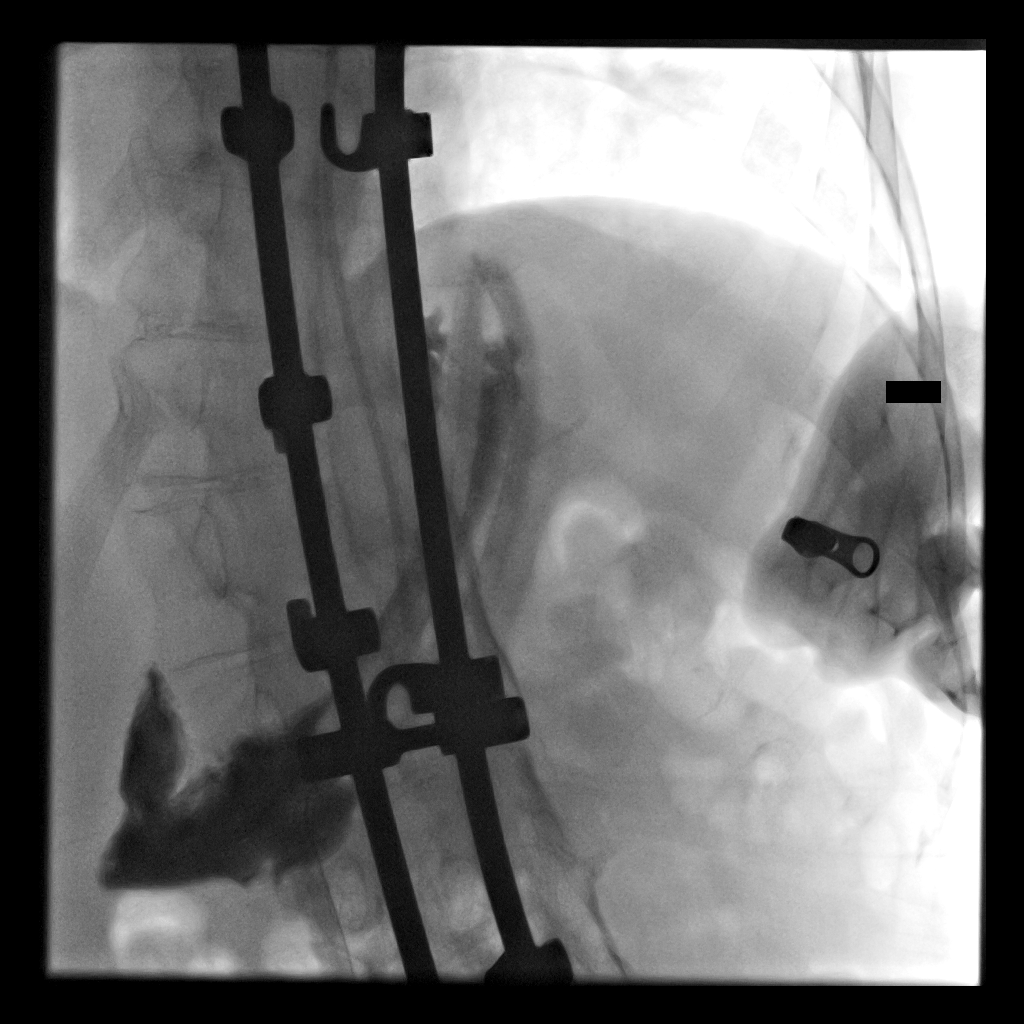
[im 3/3]
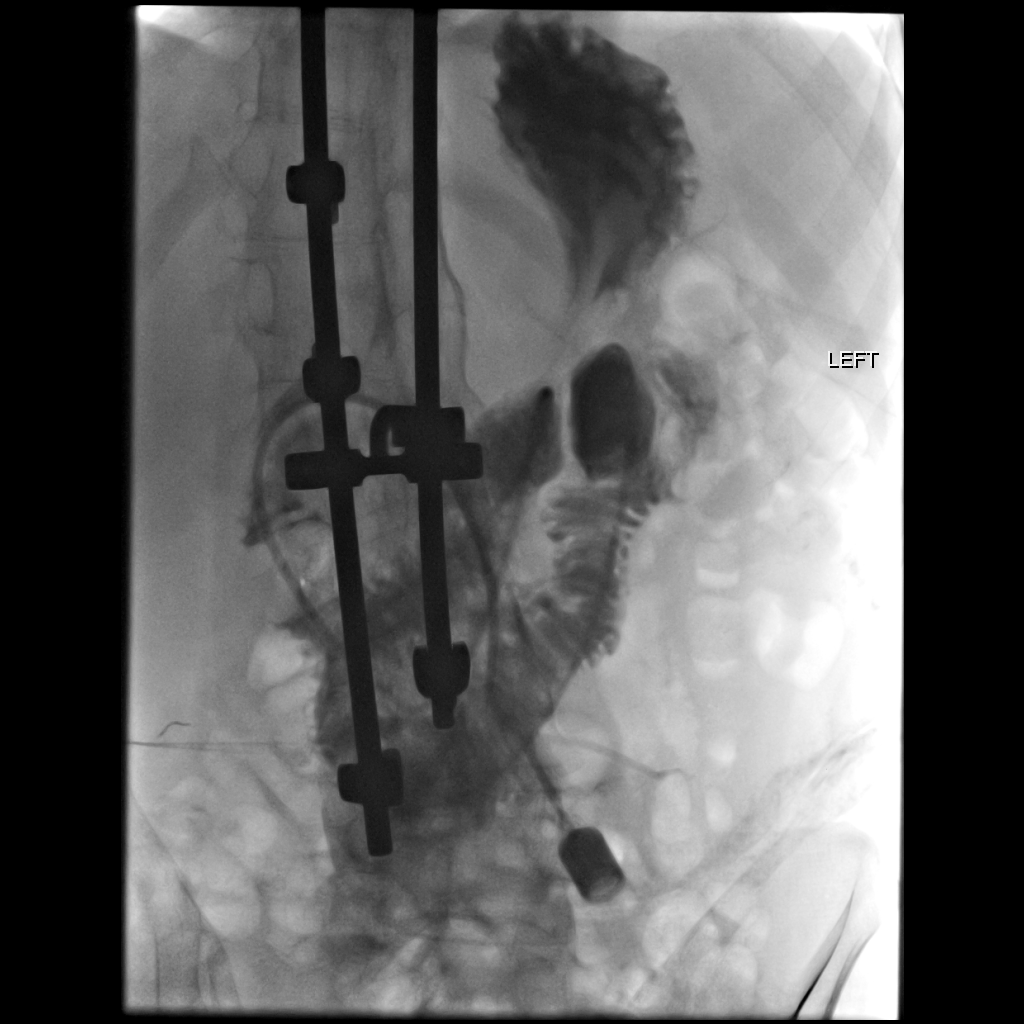

[5 of 5 positions shown; findings below may reference images not displayed]

EXAM:
JEJUNAL CATHETER INJECTION and EXCHANGE

FLUOROSCOPY TIME:  8.8 minutes, 6699  uLym4 DAP

COMPLICATIONS:
None immediate.

PROCEDURE:
Informed written consent was obtained from the family after a
thorough discussion of the procedural risks, benefits and
alternatives. All questions were addressed.

Initially, injection of the single-lumen gastrojejunostomy catheter
was performed. This demonstrate that the catheter had looped into
the fundus, tip near the pylorus. There is a kink in the catheter at
the apex of its loop in the fundus. Patient placed in left posterior
oblique position partially relieved the kink and allowed antegrade
flushing. The situation was reviewed with the patient's mother, and
the decision made to converted back to a dual-lumen device which had
worked well previously.

Maximal Sterile Barrier Technique was utilized including caps, mask,
sterile gowns, sterile gloves, sterile drape, hand hygiene and skin
antiseptic. A timeout was performed prior to the initiation of the
procedure.

The retention balloon was deflated. The catheter was removed over a
stiff angled Glidewire. With the aid of an angled angiographic
catheter, the Glidewire was advanced into the proximal jejunum. A
new dual-lumen 24 French gastrojejunostomy catheter was advanced,
positioned with the tip of the jejunal limb in the proximal jejunum.
The retention balloon was inflated with 10 mL sterile saline.
Injection of the jejunal lumen demonstrates patency into the
proximal jejunum. The external bumper was applied and the site
covered with a sterile dressing. The patient tolerated the procedure
well.
IMPRESSION: 1. The previously placed 24 French single-lumen GJ tube looped up
into the gastric fundus where the tube kinked because of the acuity
of the angle.
2. Technically successful exchange for a new dual-lumen 24 French
gastrojejunostomy device, okay for routine use.

## 2017-05-11 ENCOUNTER — Encounter: Payer: Self-pay | Admitting: Physical Medicine & Rehabilitation

## 2017-05-11 ENCOUNTER — Encounter: Payer: Self-pay | Admitting: Occupational Therapy

## 2017-05-11 ENCOUNTER — Encounter: Payer: Medicare Other | Attending: Physical Medicine & Rehabilitation | Admitting: Physical Medicine & Rehabilitation

## 2017-05-11 ENCOUNTER — Ambulatory Visit: Payer: Medicare Other | Admitting: Occupational Therapy

## 2017-05-11 VITALS — BP 106/71 | HR 78

## 2017-05-11 DIAGNOSIS — R29898 Other symptoms and signs involving the musculoskeletal system: Secondary | ICD-10-CM | POA: Diagnosis not present

## 2017-05-11 DIAGNOSIS — F418 Other specified anxiety disorders: Secondary | ICD-10-CM | POA: Insufficient documentation

## 2017-05-11 DIAGNOSIS — K219 Gastro-esophageal reflux disease without esophagitis: Secondary | ICD-10-CM | POA: Insufficient documentation

## 2017-05-11 DIAGNOSIS — R29818 Other symptoms and signs involving the nervous system: Secondary | ICD-10-CM | POA: Diagnosis not present

## 2017-05-11 DIAGNOSIS — Z9889 Other specified postprocedural states: Secondary | ICD-10-CM | POA: Diagnosis not present

## 2017-05-11 DIAGNOSIS — G8250 Quadriplegia, unspecified: Secondary | ICD-10-CM | POA: Diagnosis not present

## 2017-05-11 DIAGNOSIS — G800 Spastic quadriplegic cerebral palsy: Secondary | ICD-10-CM | POA: Diagnosis not present

## 2017-05-11 DIAGNOSIS — G809 Cerebral palsy, unspecified: Secondary | ICD-10-CM | POA: Diagnosis not present

## 2017-05-11 DIAGNOSIS — M6281 Muscle weakness (generalized): Secondary | ICD-10-CM | POA: Diagnosis not present

## 2017-05-11 NOTE — Therapy (Signed)
[facility] [address] [place], [place], [address] Phone: [phone]   Fax:  [phone]  Occupational Therapy Treatment  Patient Details  Name: CABOT CROMARTIE MRN: [id number] Date of Birth: [birth year] Referring Provider: Dr. Naaman Plummer  Encounter Date: 05/11/2017      OT End of Session - 05/11/17 1531    Visit Number 3   Number of Visits 5   Date for OT Re-Evaluation 05/04/17   Authorization Type medicare will need PN and G code every 10th visit   Authorization Time Period 60 days   Authorization - Visit Number 3   Authorization - Number of Visits 5   OT Start Time 1400   OT Stop Time 1525   OT Time Calculation (min) 85 min   Activity Tolerance Patient tolerated treatment well   Behavior During Therapy [facility] for tasks assessed/performed      Past Medical History:  Diagnosis Date  . Cerebral palsy ([place])   . Dehydration 11/22/2013  . Depression with anxiety 08/01/2010   Qualifier: Diagnosis of  By: Nelson-Smith CMA (AAMA), Dottie    . Esophagitis 2011  . 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 GERD (gastroesophageal reflux disease)   . Hyperlipidemia, mild 08/25/2015  . Hyperthyroidism   . Incontinence of feces   . Loss of weight 08/28/2014  . Medicare annual wellness visit, subsequent 08/25/2015  . Mildly underweight adult 03/16/2017  . Palpitations   . Skin lesion of right ear 03/16/2017    Past Surgical History:  Procedure Laterality Date  . baclofen trial    . baslofen pump implant    . ears tubes    . EYE SURGERY    . FLEXIBLE SIGMOIDOSCOPY N/A 09/07/2014   Procedure: FLEXIBLE SIGMOIDOSCOPY;  Surgeon: [name], MD;  Location: [facility] ENDOSCOPY;  Service: Endoscopy;  Laterality: N/A;  . g-tube insert  August 2006  . hamstring released     to treat contractures.   Marland Kitchen HIP SURGERY     x2 , side   . IR GENERIC HISTORICAL  07/01/2016   IR GASTR TUBE CONVERT GASTR-JEJ PER W/FL MOD SED 07/01/2016 [name], MD [facility]  . IR GENERIC HISTORICAL  07/08/2016   IR PATIENT EVAL TECH 0-60 MINS 07/08/2016 [name], MD [facility]  . IR GENERIC HISTORICAL  07/14/2016   IR GJ TUBE CHANGE 07/14/2016 [name], MD [facility]  . IR GENERIC HISTORICAL  07/21/2016   IR PATIENT EVAL TECH 0-60 MINS [facility]  . IR GENERIC HISTORICAL  08/31/2016   IR [place] DUODEN/JEJUNO TUBE PERCUT W/FLUORO 08/31/2016 [name], MD [facility]  . IR GENERIC HISTORICAL  09/03/2016   IR GJ TUBE CHANGE 09/03/2016 [name], MD [facility]  . IR GENERIC HISTORICAL  09/10/2016   IR GASTR TUBE CONVERT GASTR-JEJ PER W/FL MOD SED 09/10/2016 [facility]  . IR GENERIC HISTORICAL  09/16/2016   IR PATIENT EVAL TECH 0-60 MINS [facility]  . IR GENERIC HISTORICAL  09/29/2016   IR GJ TUBE CHANGE 09/29/2016 [name], MD [facility]  . IR GJ TUBE CHANGE  02/05/2017  . PEG PLACEMENT  10/21/2011   Procedure: PERCUTANEOUS ENDOSCOPIC GASTROSTOMY (PEG) REPLACEMENT;  Surgeon: [name], MD;  [facility];  Service: Endoscopy;  Laterality: N/A;  . PEG PLACEMENT N/A 06/13/2013   Procedure: PERCUTANEOUS ENDOSCOPIC GASTROSTOMY (PEG) REPLACEMENT;  Surgeon: [name], MD;  [facility];  Service: Endoscopy;  Laterality: N/A;  . SPINAL FUSION    .  spinal fusion to correct 70 degree kyphosis  11-2010  . spinal fusioncorrect 106 degree kyphosis    . SPINE SURGERY  ,11/20/2010, 2011   for correction of severe contracturing spinal kyphosis.   . TONSILLECTOMY      There were no vitals filed for this visit.      Subjective Assessment - 05/11/17 1525    Subjective  Mom indicated they just left Dr Charm Barges office, and he will hold off on Botox for 2 more months   Patient is accompained by: Family member   Pertinent History see epic.  Pt s/p botox to both wrists and hands and referred to OT for follow up.   Currently in Pain? No/denies   Pain Score 0-No pain                       OT Treatments/Exercises (OP) - 05/11/17 0001      Splinting   Splinting Patient seen today for splint assessment bilateral UE's.  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  Patient has more available movement/  passive motion in left wrist, and uses left hand on joystick to drive wheelchair.  Discussed several prefab options for right splint.  Fabricated a dorsal wrist splint with ulnar post to prevent further wrist subluxation, and maintain available joint range.  Mom and caregiver able to effectively don and doff splint.  Educated both in splint wearing schedule especially first wearing attempt.  Will order antispasticity splint for clinic to determine if this would be helpful for patient's right hand/wrist.                  OT Education - 05/11/17 1531    Education provided Yes   Education Details splint wear and care   Person(s) Educated Patient;Caregiver(s);Parent(s)   Methods Explanation;Demonstration;Tactile cues;Verbal cues   Comprehension Verbalized understanding;Returned demonstration             OT Long Term Goals - 05/11/17 1631      OT LONG TERM GOAL #1   Title Pt's caregiver and mom will be independent in HEP - 05/04/2017   Status Achieved     OT LONG TERM GOAL #2   Title Caregiver and mom will be independent in splint wear and care if indicated (will need to further assess given pt's poor tolerance in the past).    Status On-going               Plan - 05/11/17 1532    Clinical Impression Statement Fabricated left dorsal wrist support, to promote current wrist available range   Rehab Potential Fair   Current Impairments/barriers affecting progress: severty of deficits.   OT Frequency 1x / week   OT Duration 4 weeks   OT Treatment/Interventions Passive range of motion;Splinting;Patient/family education;DME and/or AE instruction   Plan check left wrist splint - assess right  wrist/hand splint   Consulted and Agree with Plan of Care Patient;Family member/caregiver   Family Member Consulted mom      Patient will benefit from skilled therapeutic intervention in order to improve the following deficits and impairments:  Decreased range of motion, Impaired UE functional use, Impaired tone, Pain  Visit Diagnosis: Other symptoms and signs involving the nervous system  Other symptoms and signs involving the musculoskeletal system    Problem List Patient Active Problem List   Diagnosis Date Noted  . Mildly underweight adult 03/16/2017  . Skin lesion of right ear 03/16/2017  .  Cerebral palsy ([place]) 04/14/2016  . Increased oropharyngeal secretions 04/14/2016  . Medicare annual wellness visit, subsequent 08/25/2015  . Hyperlipidemia, mild 08/25/2015  . Reflux 12/17/2014  . Rectal bleeding 09/06/2014  . Esophageal dysphagia 09/06/2014  . Dysphagia, pharyngoesophageal phase 09/02/2014  . Impetigo 09/02/2014  . Abnormal thyroid function test 06/10/2014  . Acute nonsuppurative otitis media of right ear 05/13/2014  . Acute bronchitis 02/27/2014  . Protein-calorie malnutrition, severe ([place]) 11/23/2013  . Decreased oral intake 11/22/2013  . 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 Cervical dystonia 08/23/2013  . Dysphagia, oropharyngeal phase 08/10/2013  . Low back pain 07/11/2013  . 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 Anemia 12/05/2010  . 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 GASTROSTOMY COMPLICATION 76/15/1834  . Infantile cerebral palsy ([place]) 08/04/2010  . Thyrotoxicosis 08/01/2010  . Depression with anxiety 08/01/2010  . GERD 08/01/2010  . OTHER ACNE 10/16/2009  . PALPITATIONS 02/25/2009  . HIP PAIN, BILATERAL 11/06/2008  . NEVI, MULTIPLE 10/09/2008  . Dysphagia 04/02/2008  . Incontinence of feces 04/02/2008  . Urinary incontinence 04/02/2008  . 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 FACIAL RASH  04/29/2007  . PALSY, INFANTILE CEREBRAL, 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    Mariah Milling, OTR/L 05/11/2017, 4:33 PM  [facility] [address] [place], [place], [address] Phone: [phone]   Fax:  [phone]  Name: ERVINE WITUCKI MRN: [id number] Date of Birth: [birth year]

## 2017-05-11 NOTE — Progress Notes (Signed)
Subjective:    Patient ID: [name], male    DOB: [birth year], 31 y.o.   MRN: [id number]  HPI   [name] is back regarding his spastic tetraplegia and CP. We performed botox injections in may to his finger and wrist flexors and he's had nice results. Mother has noticed much improvement in his hand ROM bilaterally. [name] states that his pain levels have improved dramatically. He is working with outpt OT on rom and potential splinting options. Mother/family stretch the hands and fingers daily.   Pain Inventory Average Pain 1 Pain Right Now 1 My pain is intermittent and tingling  In the last 24 hours, has pain interfered with the following? General activity 1 Relation with others 1 Enjoyment of life 0 What TIME of day is your pain at its worst? . Sleep (in general) Good  Pain is worse with: . Pain improves with: . Relief from Meds: 8  Mobility use a wheelchair needs help with transfers  Function I need assistance with the following:  feeding, dressing, bathing, toileting, meal prep, household duties and shopping  Neuro/Psych No problems in this area  Prior Studies Any changes since last visit?  no  Physicians involved in your care Any changes since last visit?  no   Family History  Problem Relation Age of Onset  . Asthma Mother   . Hyperlipidemia Mother   . COPD Mother   . Other Mother        bronchial stasis/ABPA  . Cancer Maternal Grandmother 83       breast  . Hyperlipidemia Maternal Grandmother   . Hypertension Maternal Grandmother   . Cancer Maternal Grandfather        prostate  . Heart disease Paternal Grandfather        CHF  . Osteoporosis Paternal Grandmother   . Arthritis Paternal Grandmother        rheumatoid   Social History   Social History  . Marital status: Single    Spouse name: N/A  . Number of children: 0  . Years of education: N/A   Occupational History  . disbaled    Social History Main Topics  . Smoking status: Never Smoker  .  Smokeless tobacco: Never Used  . Alcohol use No  . Drug use: No  . Sexual activity: No   Other Topics Concern  . Not on file   Social History Narrative  . No narrative on file   Past Surgical History:  Procedure Laterality Date  . baclofen trial    . baslofen pump implant    . ears tubes    . EYE SURGERY    . FLEXIBLE SIGMOIDOSCOPY N/A 09/07/2014   Procedure: FLEXIBLE SIGMOIDOSCOPY;  Surgeon: [name], MD;  Location: [facility] ENDOSCOPY;  Service: Endoscopy;  Laterality: N/A;  . g-tube insert  August 2006  . hamstring released     to treat contractures.   [name] Kitchen HIP SURGERY     x2 , side   . IR GENERIC HISTORICAL  07/01/2016   IR GASTR TUBE CONVERT GASTR-JEJ PER W/FL MOD SED 07/01/2016 [name], MD [facility]  . IR GENERIC HISTORICAL  07/08/2016   IR PATIENT EVAL TECH 0-60 MINS 07/08/2016 [name], MD [facility]  . IR GENERIC HISTORICAL  07/14/2016   IR GJ TUBE CHANGE 07/14/2016 [name], MD [facility]  . IR GENERIC HISTORICAL  07/21/2016   IR PATIENT EVAL TECH 0-60 MINS [facility]  . IR GENERIC HISTORICAL  08/31/2016  IR [place] DUODEN/JEJUNO TUBE PERCUT W/FLUORO 08/31/2016 [name], MD [facility]  . IR GENERIC HISTORICAL  09/03/2016   IR GJ TUBE CHANGE 09/03/2016 [name], MD [facility]  . IR GENERIC HISTORICAL  09/10/2016   IR GASTR TUBE CONVERT GASTR-JEJ PER W/FL MOD SED 09/10/2016 [facility]  . IR GENERIC HISTORICAL  09/16/2016   IR PATIENT EVAL TECH 0-60 MINS [facility]  . IR GENERIC HISTORICAL  09/29/2016   IR GJ TUBE CHANGE 09/29/2016 [name], MD [facility]  . IR GJ TUBE CHANGE  02/05/2017  . PEG PLACEMENT  10/21/2011   Procedure: PERCUTANEOUS ENDOSCOPIC GASTROSTOMY (PEG) REPLACEMENT;  Surgeon: [name], MD;  [facility];  Service: Endoscopy;  Laterality: N/A;  . PEG PLACEMENT N/A 06/13/2013   Procedure: PERCUTANEOUS ENDOSCOPIC GASTROSTOMY (PEG) REPLACEMENT;  Surgeon: [name], MD;  [facility];   Service: Endoscopy;  Laterality: N/A;  . SPINAL FUSION    . spinal fusion to correct 70 degree kyphosis  11-2010  . spinal fusioncorrect 106 degree kyphosis    . SPINE SURGERY  ,11/20/2010, 2011   for correction of severe contracturing spinal kyphosis.   . TONSILLECTOMY     Past Medical History:  Diagnosis Date  . Cerebral palsy ([place])   . Dehydration 11/22/2013  . Depression with anxiety 08/01/2010   Qualifier: Diagnosis of  By: Nelson-Smith CMA (AAMA), Dottie    . Esophagitis 2011  . Gastrostomy in place [facility]) 08/31/2013  . GERD (gastroesophageal reflux disease)   . Hyperlipidemia, mild 08/25/2015  . Hyperthyroidism   . Incontinence of feces   . Loss of weight 08/28/2014  . Medicare annual wellness visit, subsequent 08/25/2015  . Mildly underweight adult 03/16/2017  . Palpitations   . Skin lesion of right ear 03/16/2017   There were no vitals taken for this visit.  Opioid Risk Score:   Fall Risk Score:  `1  Depression screen PHQ 2/9  No flowsheet data found.   Review of Systems  All other systems reviewed and are negative.      Objective:   Physical Exam General: alert and appears to be in good health.  HEENT: Head is normocephalic, atraumatic, PERRLA, EOMI, sclera anicteric, oral mucosa pink and moist, dentition intact, ext ear canals clear,  Neck: Supple without JVD or lymphadenopathy Heart: rrr Chest: cta, normal effort Abdomen: Soft, non-tender, non-distended, bowel sounds positive. Extremities: No clubbing, cyanosis, or edema. Pulses are 2+ Skin: Clean and intact without signs of breakdown Neuro: [name] has reasonable insight and awareness. His speech remains largely unintellgibile. He has some simple movement in all four limbs but is limited by tone. He has   tightness in his left SCM although head remains more neutral than in past. Biceps are 2/4 bilaterally. Wrist and finger flexors/lumbricals are 2/4 with underlying contracture of palmar tendons and finger  joints, wrists both pronated.. Hamstrings and quads are 2/4 bilaterally. 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 He does sense pain in all 4 limbs.  Musculoskeletal: Full ROM, No pain with AROM or PROM in the neck, trunk, or extremities. Posture appropriate Psych: Pt's affect is appropriate. Pt is cooperative        Assessment & Plan:  1. 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 Cervical Dystonia   Plan:  1. Will arrange for repeat botox 600 units to bilateral wrist and finger flexors in two months. We can potentially put these off longer depending upon his response to therapy. We discussed splinting options today  as well.  2. Continue with outpt PT at [facility].  .  3. Will see him back in about 2 months for potential injections. Fifteen minutes of face to face patient care time were spent during this visit. All questions were encouraged and answered.   [name] Kitchen

## 2017-05-11 NOTE — Patient Instructions (Signed)
Hand ball splint??

## 2017-05-18 ENCOUNTER — Ambulatory Visit: Payer: Medicare Other | Admitting: Occupational Therapy

## 2017-05-18 ENCOUNTER — Encounter: Payer: Self-pay | Admitting: Occupational Therapy

## 2017-05-18 DIAGNOSIS — R29818 Other symptoms and signs involving the nervous system: Secondary | ICD-10-CM | POA: Diagnosis not present

## 2017-05-18 DIAGNOSIS — R29898 Other symptoms and signs involving the musculoskeletal system: Secondary | ICD-10-CM

## 2017-05-18 DIAGNOSIS — M6281 Muscle weakness (generalized): Secondary | ICD-10-CM

## 2017-05-18 NOTE — Therapy (Signed)
[facility] [address] [place], [place], [address] Phone: [phone]   Fax:  [phone]  Occupational Therapy Treatment  Patient Details  Name: [name] MRN: [id number] Date of Birth: December 27, 1985 Referring Provider: Dr. Naaman Plummer  Encounter Date: 05/18/2017      OT End of Session - 05/18/17 1722    Visit Number 4   Number of Visits 5   Date for OT Re-Evaluation 05/04/17   Authorization Type medicare will need PN and G code every 10th visit   Authorization Time Period 60 days   Authorization - Visit Number 4   Authorization - Number of Visits 5   OT Start Time 9833   OT Stop Time 1402   OT Time Calculation (min) 46 min   Activity Tolerance Patient tolerated treatment well      Past Medical History:  Diagnosis Date  . Cerebral palsy ([place])   . Dehydration 11/22/2013  . Depression with anxiety 08/01/2010   Qualifier: Diagnosis of  By: Nelson-Smith CMA (AAMA), Dottie    . Esophagitis 2011  . Gastrostomy in place [facility]) 08/31/2013  . GERD (gastroesophageal reflux disease)   . Hyperlipidemia, mild 08/25/2015  . Hyperthyroidism   . Incontinence of feces   . Loss of weight 08/28/2014  . Medicare annual wellness visit, subsequent 08/25/2015  . Mildly underweight adult 03/16/2017  . Palpitations   . Skin lesion of right ear 03/16/2017    Past Surgical History:  Procedure Laterality Date  . baclofen trial    . baslofen pump implant    . ears tubes    . EYE SURGERY    . FLEXIBLE SIGMOIDOSCOPY N/A 09/07/2014   Procedure: FLEXIBLE SIGMOIDOSCOPY;  Surgeon: [name], MD;  Location: [facility] ENDOSCOPY;  Service: Endoscopy;  Laterality: N/A;  . g-tube insert  August 2006  . hamstring released     to treat contractures.   Marland Kitchen HIP SURGERY     x2 , side   . IR GENERIC HISTORICAL  07/01/2016   IR GASTR TUBE CONVERT GASTR-JEJ PER W/FL MOD SED 07/01/2016 [name], MD [facility]  . IR GENERIC HISTORICAL  07/08/2016    IR PATIENT EVAL TECH 0-60 MINS 07/08/2016 [name], MD [facility]  . IR GENERIC HISTORICAL  07/14/2016   IR GJ TUBE CHANGE 07/14/2016 [name], MD [facility]  . IR GENERIC HISTORICAL  07/21/2016   IR PATIENT EVAL TECH 0-60 MINS [facility]  . IR GENERIC HISTORICAL  08/31/2016   IR [place] DUODEN/JEJUNO TUBE PERCUT W/FLUORO 08/31/2016 [name], MD [facility]  . IR GENERIC HISTORICAL  09/03/2016   IR GJ TUBE CHANGE 09/03/2016 [name], MD [facility]  . IR GENERIC HISTORICAL  09/10/2016   IR GASTR TUBE CONVERT GASTR-JEJ PER W/FL MOD SED 09/10/2016 [facility]  . IR GENERIC HISTORICAL  09/16/2016   IR PATIENT EVAL TECH 0-60 MINS [facility]  . IR GENERIC HISTORICAL  09/29/2016   IR GJ TUBE CHANGE 09/29/2016 [name], MD [facility]  . IR GJ TUBE CHANGE  02/05/2017  . PEG PLACEMENT  10/21/2011   Procedure: PERCUTANEOUS ENDOSCOPIC GASTROSTOMY (PEG) REPLACEMENT;  Surgeon: [name], MD;  [facility];  Service: Endoscopy;  Laterality: N/A;  . PEG PLACEMENT N/A 06/13/2013   Procedure: PERCUTANEOUS ENDOSCOPIC GASTROSTOMY (PEG) REPLACEMENT;  Surgeon: [name], MD;  [facility];  Service: Endoscopy;  Laterality: N/A;  . SPINAL FUSION    . spinal fusion to correct 70  degree kyphosis  11-2010  . spinal fusioncorrect 106 degree kyphosis    . SPINE SURGERY  ,11/20/2010, 2011   for correction of severe contracturing spinal kyphosis.   . TONSILLECTOMY      There were no vitals filed for this visit.      Subjective Assessment - 05/18/17 1317    Subjective  Mom states no problems with splint made last session for left hand   Patient is accompained by: Family member  mom and caregiver   Pertinent History see epic.  Pt s/p botox to both wrists and hands and referred to OT for follow up.   Currently in Pain? No/denies                      OT Treatments/Exercises (OP) - 05/18/17 0001      Splinting   Splinting Started  fabication for custom volar splint for RUE to prevent further worsening of wrist flexion contracture and potential skin break down.  Splinting difficult due to signficant contracture and current spasticity and therefore time consuming.  Will complete next visit.                      OT Long Term Goals - 05/18/17 1714      OT LONG TERM GOAL #1   Title Pt's caregiver and mom will be independent in HEP - 06/01/2017 (date adjusted to meet certication period and pt has attended 4/5 visits)   Status Achieved     OT LONG TERM GOAL #2   Title Caregiver and mom will be independent in splint wear and care if indicated (will need to further assess given pt's poor tolerance in the past).    Status On-going               Plan - 05/18/17 1721    Clinical Impression Statement Pt progressing toward goals; mom and caregiver report that pt is tolerating LUE splint well.     Rehab Potential Fair   Current Impairments/barriers affecting progress: severty of deficits.   OT Frequency 1x / week   OT Duration 4 weeks   OT Treatment/Interventions Passive range of motion;Splinting;Patient/family education;DME and/or AE instruction   Plan renew, complete R splint, issue wearing schedule, add 1 additional visits for splint check.   Consulted and Agree with Plan of Care Patient;Family member/caregiver   Family Member Consulted mom,       Patient will benefit from skilled therapeutic intervention in order to improve the following deficits and impairments:  Decreased range of motion, Impaired UE functional use, Impaired tone, Pain  Visit Diagnosis: Other symptoms and signs involving the nervous system  Other symptoms and signs involving the musculoskeletal system  Muscle weakness (generalized)    Problem List Patient Active Problem List   Diagnosis Date Noted  . Mildly underweight adult 03/16/2017  . Skin lesion of right ear 03/16/2017  . Cerebral palsy ([place]) 04/14/2016  . Increased  oropharyngeal secretions 04/14/2016  . Medicare annual wellness visit, subsequent 08/25/2015  . Hyperlipidemia, mild 08/25/2015  . Reflux 12/17/2014  . Rectal bleeding 09/06/2014  . Esophageal dysphagia 09/06/2014  . Dysphagia, pharyngoesophageal phase 09/02/2014  . Impetigo 09/02/2014  . Abnormal thyroid function test 06/10/2014  . Acute nonsuppurative otitis media of right ear 05/13/2014  . Acute bronchitis 02/27/2014  . Protein-calorie malnutrition, severe ([place]) 11/23/2013  . Decreased oral intake 11/22/2013  . Gastrostomy in place [facility]) 08/31/2013  . Spastic tetraplegia ([place]) 08/23/2013  .  Cervical dystonia 08/23/2013  . Dysphagia, oropharyngeal phase 08/10/2013  . Low back pain 07/11/2013  . 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 Anemia 12/05/2010  . 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 GASTROSTOMY COMPLICATION 46/27/0350  . Infantile cerebral palsy ([place]) 08/04/2010  . Thyrotoxicosis 08/01/2010  . Depression with anxiety 08/01/2010  . GERD 08/01/2010  . OTHER ACNE 10/16/2009  . PALPITATIONS 02/25/2009  . HIP PAIN, BILATERAL 11/06/2008  . NEVI, MULTIPLE 10/09/2008  . Dysphagia 04/02/2008  . Incontinence of feces 04/02/2008  . Urinary incontinence 04/02/2008  . 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 FACIAL RASH 04/29/2007  . PALSY, INFANTILE CEREBRAL, 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    Quay Burow, OTR/L 05/18/2017, 5:23 PM  [facility] [address] [place], [place], [address] Phone: [phone]   Fax:  [phone]  Name: [name] MRN: [id number] Date of Birth: 20-Jul-1986

## 2017-05-20 ENCOUNTER — Other Ambulatory Visit (HOSPITAL_COMMUNITY): Payer: Self-pay | Admitting: Interventional Radiology

## 2017-05-20 DIAGNOSIS — R633 Feeding difficulties, unspecified: Secondary | ICD-10-CM

## 2017-05-21 ENCOUNTER — Encounter (HOSPITAL_COMMUNITY): Payer: Self-pay | Admitting: Interventional Radiology

## 2017-05-21 ENCOUNTER — Ambulatory Visit (HOSPITAL_COMMUNITY)
Admission: RE | Admit: 2017-05-21 | Discharge: 2017-05-21 | Disposition: A | Discharge status: Home / Self Care | Payer: Medicare Other | Source: Ambulatory Visit | Attending: Interventional Radiology | Admitting: Interventional Radiology

## 2017-05-21 DIAGNOSIS — T85518A Breakdown (mechanical) of other gastrointestinal prosthetic devices, implants and grafts, initial encounter: Secondary | ICD-10-CM | POA: Diagnosis not present

## 2017-05-21 DIAGNOSIS — G809 Cerebral palsy, unspecified: Secondary | ICD-10-CM | POA: Diagnosis not present

## 2017-05-21 DIAGNOSIS — Y733 Surgical instruments, materials and gastroenterology and urology devices (including sutures) associated with adverse incidents: Secondary | ICD-10-CM | POA: Diagnosis not present

## 2017-05-21 DIAGNOSIS — R633 Feeding difficulties, unspecified: Secondary | ICD-10-CM

## 2017-05-21 DIAGNOSIS — R1310 Dysphagia, unspecified: Secondary | ICD-10-CM | POA: Diagnosis not present

## 2017-05-21 DIAGNOSIS — K9423 Gastrostomy malfunction: Secondary | ICD-10-CM | POA: Diagnosis not present

## 2017-05-21 HISTORY — PX: IR GJ TUBE CHANGE: IMG1440

## 2017-05-21 MED ORDER — IOPAMIDOL (ISOVUE-300) INJECTION 61%
25.0000 mL | Freq: Once | INTRAVENOUS | Status: AC | PRN
  Administered 2017-05-21: 25 mL

## 2017-05-21 NOTE — Procedures (Signed)
Interventional Radiology Procedure Note  Procedure: Replacement of damaged G-J tube.  30F placed, modified by amputating approximately 5cm proximal to the weighted tip.   Complications: None Recommendations:  - Ok to use - Do not submerge  - Routine care   Signed,  Dulcy Fanny. Earleen Newport, DO

## 2017-05-27 ENCOUNTER — Telehealth: Payer: Self-pay | Admitting: Internal Medicine

## 2017-05-27 ENCOUNTER — Other Ambulatory Visit: Payer: Self-pay

## 2017-05-27 MED ORDER — SUCRALFATE 1 G PO TABS: 1.0000 g | ORAL_TABLET | Freq: Three times a day (TID) | ORAL | 0 refills | Status: DC

## 2017-05-27 NOTE — Telephone Encounter (Signed)
Yes, ok for carafate 1 g up to TID-AC and HS

## 2017-05-27 NOTE — Progress Notes (Signed)
Mrs. Conigliaro advised. Rx to [facility]

## 2017-05-27 NOTE — Telephone Encounter (Signed)
Mother is advised.

## 2017-05-27 NOTE — Telephone Encounter (Signed)
Mrs. Knust has already contacted IR about the feeding tube. She would like to try Carafate for a few days first. Patient complains of his stomach hurting. Says he has used Carafate in the past with success for the same issue. IR said they will evaluatethe placement if Carafet does not help. Okay to do this? Carafate 1 gram BID?

## 2017-05-28 ENCOUNTER — Encounter (HOSPITAL_COMMUNITY): Payer: Self-pay | Admitting: Interventional Radiology

## 2017-05-28 ENCOUNTER — Ambulatory Visit (HOSPITAL_COMMUNITY)
Admission: RE | Admit: 2017-05-28 | Discharge: 2017-05-28 | Disposition: A | Discharge status: Home / Self Care | Payer: Medicare Other | Source: Ambulatory Visit | Attending: General Surgery | Admitting: General Surgery

## 2017-05-28 ENCOUNTER — Other Ambulatory Visit (HOSPITAL_COMMUNITY): Payer: Self-pay | Admitting: General Surgery

## 2017-05-28 DIAGNOSIS — Z434 Encounter for attention to other artificial openings of digestive tract: Secondary | ICD-10-CM | POA: Diagnosis not present

## 2017-05-28 DIAGNOSIS — R633 Feeding difficulties, unspecified: Secondary | ICD-10-CM

## 2017-05-28 DIAGNOSIS — K9423 Gastrostomy malfunction: Secondary | ICD-10-CM | POA: Diagnosis not present

## 2017-05-28 HISTORY — PX: IR CM INJ ANY COLONIC TUBE W/FLUORO: IMG2336

## 2017-05-28 MED ORDER — IOPAMIDOL (ISOVUE-300) INJECTION 61%
15.0000 mL | Freq: Once | INTRAVENOUS | Status: AC | PRN
  Administered 2017-05-28: 15 mL

## 2017-05-28 MED ORDER — IOPAMIDOL (ISOVUE-300) INJECTION 61%
INTRAVENOUS | Status: DC
Start: 2017-05-28 — End: 2017-05-29
  Filled 2017-05-28: qty 50

## 2017-06-01 ENCOUNTER — Telehealth: Payer: Self-pay | Admitting: *Deleted

## 2017-06-01 ENCOUNTER — Encounter: Payer: Self-pay | Admitting: Occupational Therapy

## 2017-06-01 NOTE — Telephone Encounter (Signed)
Received Physician Orders and Addendum to Plan of [address], forwarded to provider/SLS 07/31

## 2017-06-11 ENCOUNTER — Other Ambulatory Visit (HOSPITAL_COMMUNITY): Payer: Self-pay

## 2017-06-15 ENCOUNTER — Ambulatory Visit: Payer: Medicare Other | Attending: Family Medicine | Admitting: Occupational Therapy

## 2017-06-15 ENCOUNTER — Encounter: Payer: Self-pay | Admitting: Occupational Therapy

## 2017-06-15 DIAGNOSIS — R29898 Other symptoms and signs involving the musculoskeletal system: Secondary | ICD-10-CM | POA: Diagnosis not present

## 2017-06-15 DIAGNOSIS — R29818 Other symptoms and signs involving the nervous system: Secondary | ICD-10-CM | POA: Diagnosis not present

## 2017-06-15 DIAGNOSIS — M6281 Muscle weakness (generalized): Secondary | ICD-10-CM | POA: Diagnosis not present

## 2017-06-15 NOTE — Therapy (Signed)
[facility] [address] [place], [place], [address] Phone: [phone]   Fax:  [phone]  Occupational Therapy Treatment  Patient Details  Name: [name] MRN: [id number] Date of Birth: 1986-08-13 Referring Provider: Dr. Naaman Plummer  Encounter Date: 06/15/2017      OT End of Session - 06/15/17 1626    Visit Number 5   Number of Visits 7   Date for OT Re-Evaluation 07/13/17   Authorization Type medicare will need PN and G code every 10th visit   Authorization Time Period 60 days   Authorization - Visit Number 5   Authorization - Number of Visits 7   OT Start Time 5631   OT Stop Time 1445   OT Time Calculation (min) 43 min   Activity Tolerance Patient tolerated treatment well      Past Medical History:  Diagnosis Date  . Cerebral palsy ([place])   . Dehydration 11/22/2013  . Depression with anxiety 08/01/2010   Qualifier: Diagnosis of  By: Nelson-Smith CMA (AAMA), Dottie    . Esophagitis 2011  . Gastrostomy in place [facility]) 08/31/2013  . GERD (gastroesophageal reflux disease)   . Hyperlipidemia, mild 08/25/2015  . Hyperthyroidism   . Incontinence of feces   . Loss of weight 08/28/2014  . Medicare annual wellness visit, subsequent 08/25/2015  . Mildly underweight adult 03/16/2017  . Palpitations   . Skin lesion of right ear 03/16/2017    Past Surgical History:  Procedure Laterality Date  . baclofen trial    . baslofen pump implant    . ears tubes    . EYE SURGERY    . FLEXIBLE SIGMOIDOSCOPY N/A 09/07/2014   Procedure: FLEXIBLE SIGMOIDOSCOPY;  Surgeon: [name], MD;  Location: [facility] ENDOSCOPY;  Service: Endoscopy;  Laterality: N/A;  . g-tube insert  August 2006  . hamstring released     to treat contractures.   Marland Kitchen HIP SURGERY     x2 , side   . IR CM INJ ANY COLONIC TUBE W/FLUORO  05/28/2017  . IR GENERIC HISTORICAL  07/01/2016   IR GASTR TUBE CONVERT GASTR-JEJ PER W/FL MOD SED 07/01/2016 [name], MD  [facility]  . IR GENERIC HISTORICAL  07/08/2016   IR PATIENT EVAL TECH 0-60 MINS 07/08/2016 [name], MD [facility]  . IR GENERIC HISTORICAL  07/14/2016   IR GJ TUBE CHANGE 07/14/2016 [name], MD [facility]  . IR GENERIC HISTORICAL  07/21/2016   IR PATIENT EVAL TECH 0-60 MINS [facility]  . IR GENERIC HISTORICAL  08/31/2016   IR [place] DUODEN/JEJUNO TUBE PERCUT W/FLUORO 08/31/2016 [name], MD [facility]  . IR GENERIC HISTORICAL  09/03/2016   IR GJ TUBE CHANGE 09/03/2016 [name], MD [facility]  . IR GENERIC HISTORICAL  09/10/2016   IR GASTR TUBE CONVERT GASTR-JEJ PER W/FL MOD SED 09/10/2016 [facility]  . IR GENERIC HISTORICAL  09/16/2016   IR PATIENT EVAL TECH 0-60 MINS [facility]  . IR GENERIC HISTORICAL  09/29/2016   IR GJ TUBE CHANGE 09/29/2016 [name], MD [facility]  . IR GJ TUBE CHANGE  02/05/2017  . IR GJ TUBE CHANGE  05/21/2017  . PEG PLACEMENT  10/21/2011   Procedure: PERCUTANEOUS ENDOSCOPIC GASTROSTOMY (PEG) REPLACEMENT;  Surgeon: [name], MD;  [facility];  Service: Endoscopy;  Laterality: N/A;  . PEG PLACEMENT N/A 06/13/2013   Procedure: PERCUTANEOUS ENDOSCOPIC GASTROSTOMY (PEG) REPLACEMENT;  Surgeon: [name], MD;  [facility];  Service: Endoscopy;  Laterality: N/A;  . SPINAL FUSION    . spinal fusion to correct 70 degree kyphosis  11-2010  . spinal fusioncorrect 106 degree kyphosis    . SPINE SURGERY  ,11/20/2010, 2011   for correction of severe contracturing spinal kyphosis.   . TONSILLECTOMY      There were no vitals filed for this visit.      Subjective Assessment - 06/15/17 1620    Subjective  Mom states pt is still doing well with splint for L hand   Patient is accompained by: Family member  mother   Pertinent History see epic.  Pt s/p botox to both wrists and hands and referred to OT for follow up.   Currently in Pain? No/denies                      OT Treatments/Exercises  (OP) - 06/15/17 0001      Splinting   Splinting Completed splint for R hand to prevent further contracture in wrist.  Reviewed purpose of splint (splint is not intended to increase ROM of R hand/wrist but to prevent further contracture as pt will not tolerate stretch to wrist/fingers), wearing schedule and things to monitor. Mom is aware that she can call and make an appt should any issues arise with splint.  Pt able to verbalize that splint was comfortable.                  OT Education - 06/15/17 1624    Education provided Yes   Education Details splint wear and care for RUE   Person(s) Educated Patient;Parent(s)   Methods Explanation   Comprehension Verbalized understanding             OT Long Term Goals - 06/15/17 1625      OT LONG TERM GOAL #1   Title Pt's caregiver and mom will be independent in HEP - 06/01/2017 (date adjusted to meet certication period and pt has attended 4/5 visits)   Status Achieved     OT LONG TERM GOAL #2   Title Caregiver and mom will be independent in splint wear and care if indicated (will need to further assess given pt's poor tolerance in the past).    Status Achieved               Plan - 06/15/17 1625    Clinical Impression Statement Pt has met all LTG's.  Mom aware that she can call and make an appt if issues arise with splints.    Rehab Potential Fair   Current Impairments/barriers affecting progress: severty of deficits.   OT Frequency 1x / week   OT Duration 4 weeks   OT Treatment/Interventions Passive range of motion;Splinting;Patient/family education;DME and/or AE instruction   Plan place on hold in case pt has issue with splint.    Consulted and Agree with Plan of Care Patient;Family member/caregiver   Family Member Consulted mom,       Patient will benefit from skilled therapeutic intervention in order to improve the following deficits and impairments:  Decreased range of motion, Impaired UE functional use, Impaired  tone, Pain  Visit Diagnosis: Other symptoms and signs involving the nervous system - Plan: Ot plan of care cert/re-cert  Other symptoms and signs involving the musculoskeletal system - Plan: Ot plan of care cert/re-cert  Muscle weakness (generalized) - Plan: Ot plan of care cert/re-cert    Problem List Patient Active Problem List   Diagnosis Date Noted  .  Mildly underweight adult 03/16/2017  . Skin lesion of right ear 03/16/2017  . Cerebral palsy ([place]) 04/14/2016  . Increased oropharyngeal secretions 04/14/2016  . Medicare annual wellness visit, subsequent 08/25/2015  . Hyperlipidemia, mild 08/25/2015  . Reflux 12/17/2014  . Rectal bleeding 09/06/2014  . Esophageal dysphagia 09/06/2014  . Dysphagia, pharyngoesophageal phase 09/02/2014  . Impetigo 09/02/2014  . Abnormal thyroid function test 06/10/2014  . Acute nonsuppurative otitis media of right ear 05/13/2014  . Acute bronchitis 02/27/2014  . Protein-calorie malnutrition, severe ([place]) 11/23/2013  . Decreased oral intake 11/22/2013  . Gastrostomy in place [facility]) 08/31/2013  . Spastic tetraplegia ([place]) 08/23/2013  . Cervical dystonia 08/23/2013  . Dysphagia, oropharyngeal phase 08/10/2013  . Low back pain 07/11/2013  . 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 Anemia 12/05/2010  . 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 GASTROSTOMY COMPLICATION 95/97/4718  . Infantile cerebral palsy ([place]) 08/04/2010  . Thyrotoxicosis 08/01/2010  . Depression with anxiety 08/01/2010  . GERD 08/01/2010  . OTHER ACNE 10/16/2009  . PALPITATIONS 02/25/2009  . HIP PAIN, BILATERAL 11/06/2008  . NEVI, MULTIPLE 10/09/2008  . Dysphagia 04/02/2008  . Incontinence of feces 04/02/2008  . Urinary incontinence 04/02/2008  . 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 FACIAL RASH 04/29/2007  . PALSY, INFANTILE CEREBRAL, 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    Quay Burow, OTR/L 06/15/2017, 4:31 PM  [facility] [address] [place], [place], [address] Phone: [phone]   Fax:  [phone]  Name: DESIREE FLEMING MRN: [id number] Date of Birth: 05-07-1986

## 2017-06-19 IMAGING — CT CT NECK W/ CM
4 of 5 series · 14 of 33 positions shown, 16 images · IV contrast (iopamidol)
Comparison: Neck CT 09/06/2014.

CLINICAL DATA: 30-year-old male with cerebral palsy. Caregivers
report an intermittent neck mass, although the area of clinical
concern could not be identified or specified today. Initial
encounter.

EXAM:
CT NECK WITH CONTRAST
TECHNIQUE: Multidetector CT imaging of the neck was performed using the
standard protocol following the bolus administration of intravenous
contrast.
CONTRAST:  75mL 1O3AXW-CYY IOPAMIDOL (1O3AXW-CYY) INJECTION 61%

[Series 3: axial neck · axial · 0.51mm/px · z∈[-185,-39]mm · 4 of 123 slices shown, 5 images]
[im 25/123  soft-tissue]
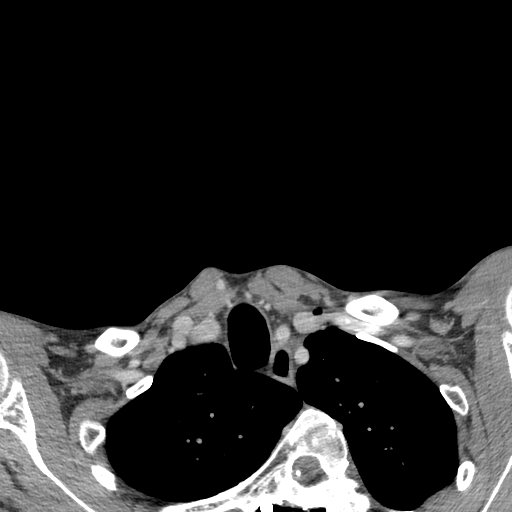
[im 25/123  bone]
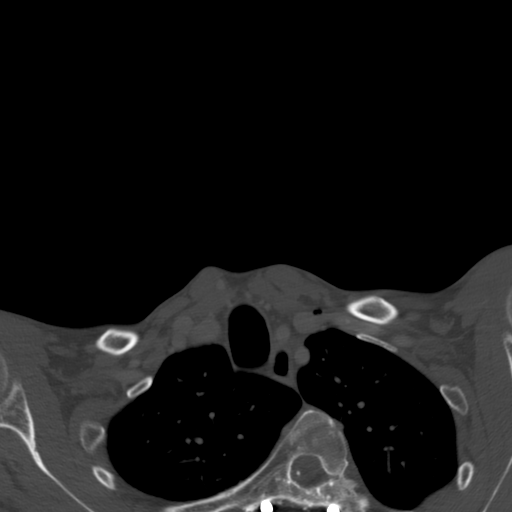
[im 49/123  bone]
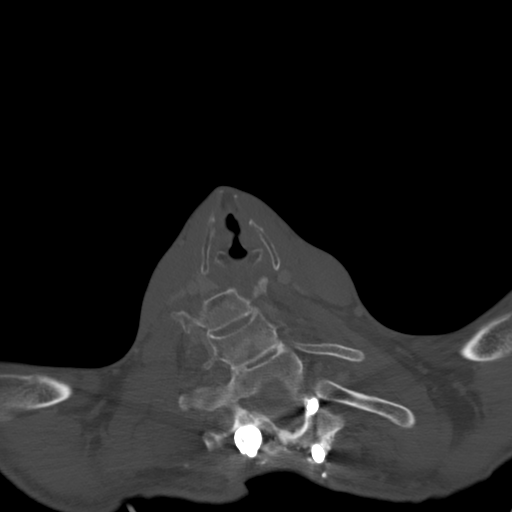
[im 74/123  bone]
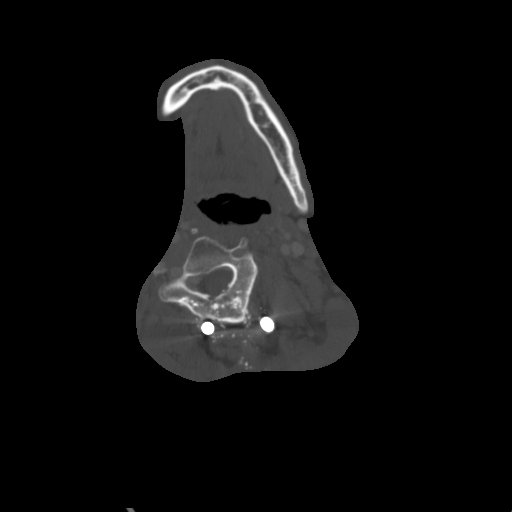
[im 98/123  bone]
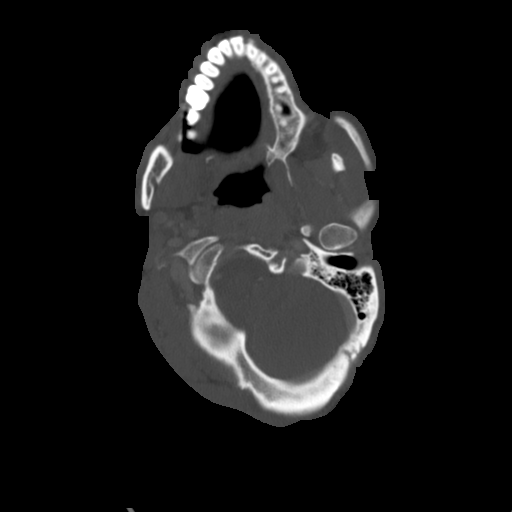

[Series 7: sag neck · sagittal · 0.48mm/px · 5 of 97 slices shown, 6 images]
[im 33/97  bone]
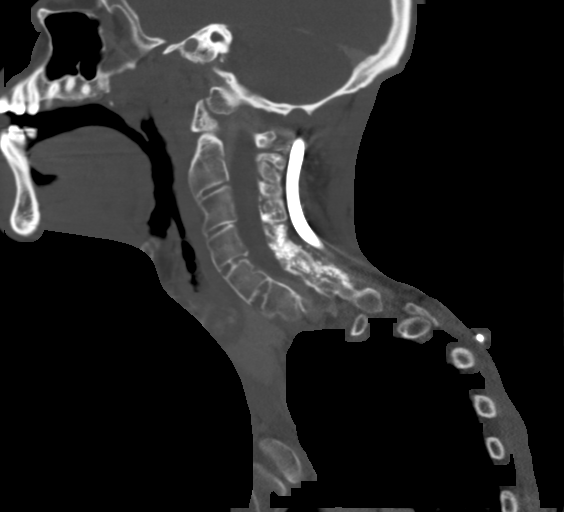
[im 41/97  bone]
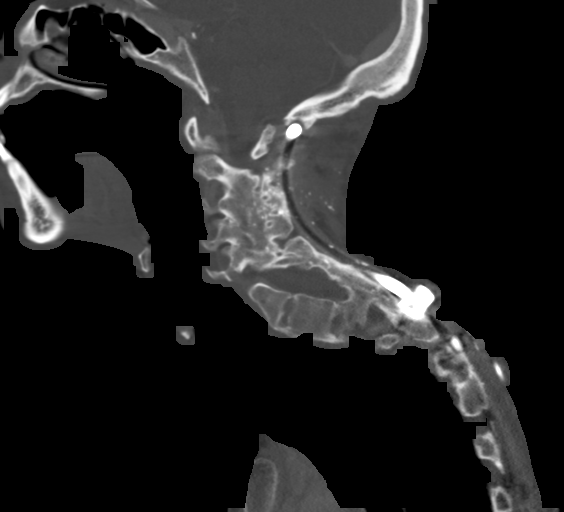
[im 49/97  soft-tissue]
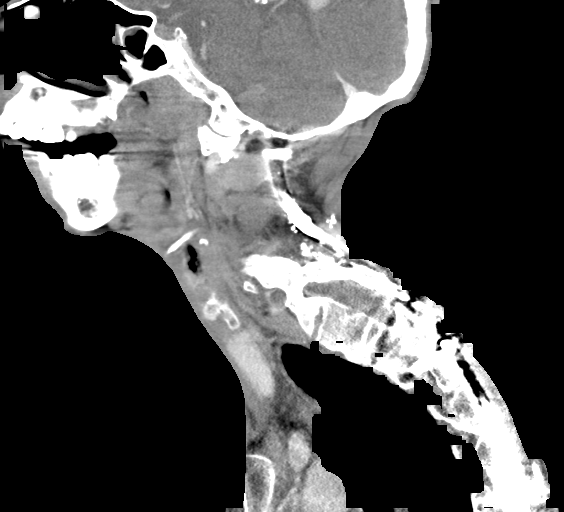
[im 49/97  bone]
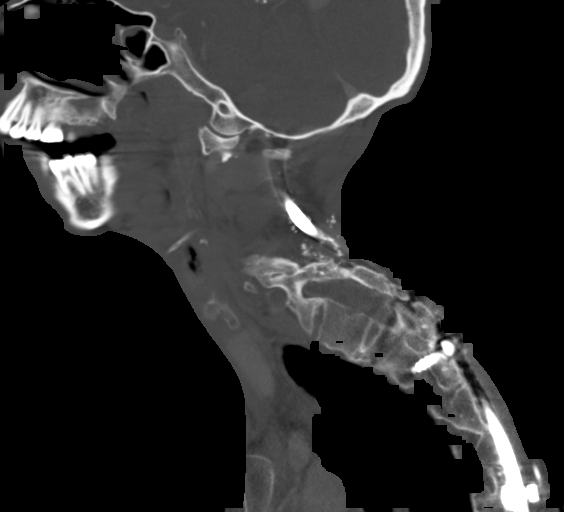
[im 57/97  bone]
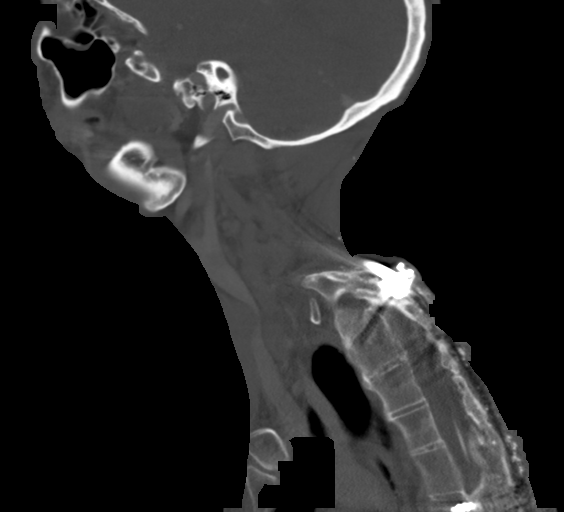
[im 65/97  bone]
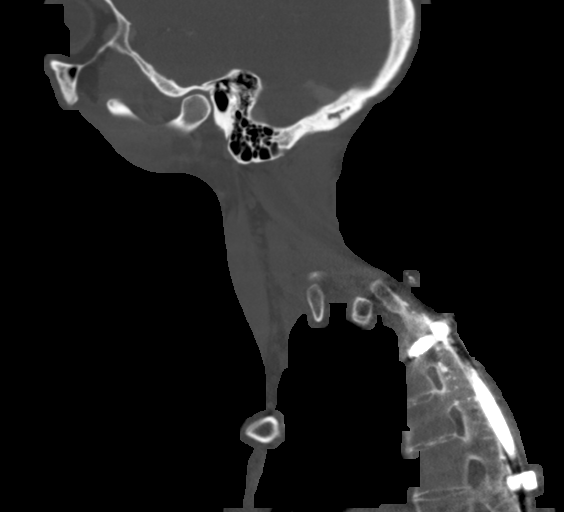

[Series 8: cor neck · coronal · 0.45mm/px · 3 of 119 slices shown]
[im 40/119  bone]
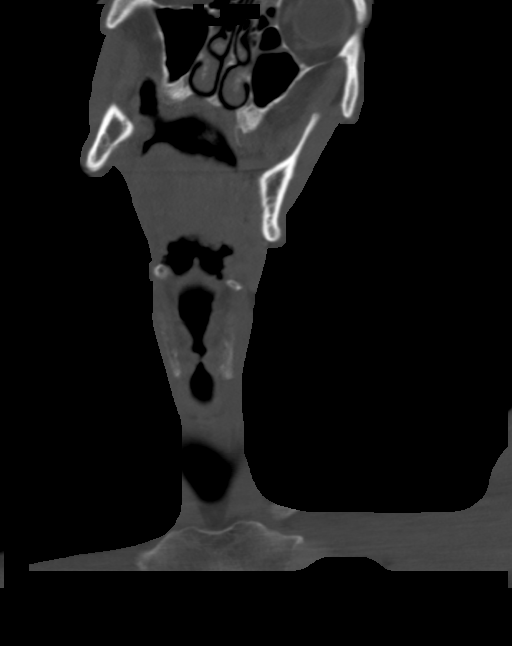
[im 53/119  bone]
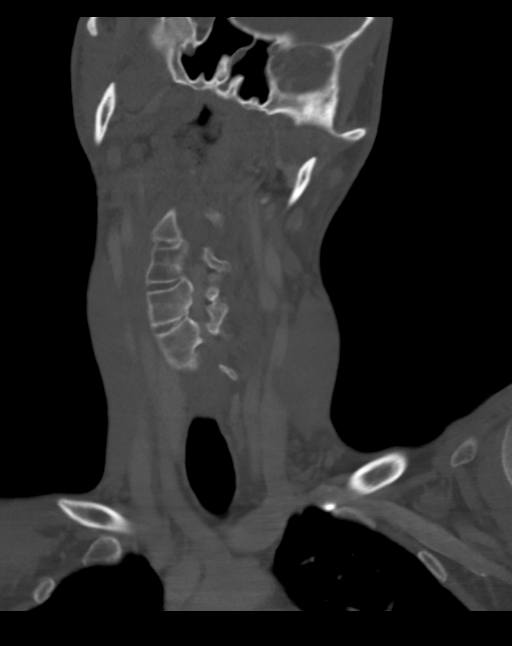
[im 66/119  bone]
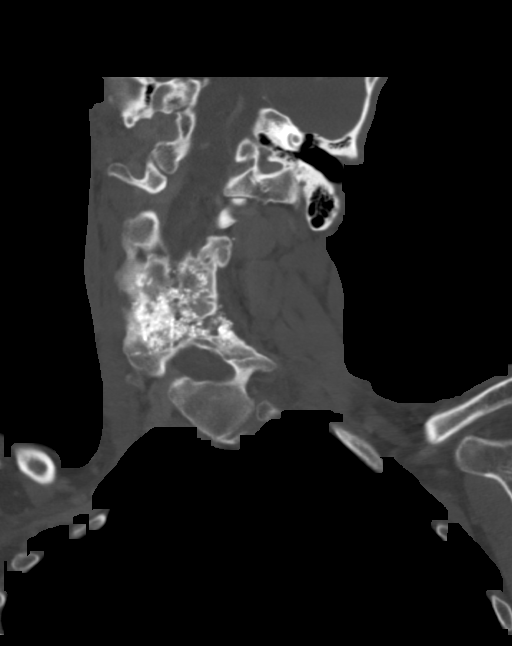

[Series 9: orthogonal ax · axial · 0.39mm/px · z∈[-218,-169]mm · 2 of 130 slices shown]
[im 26/130  bone]
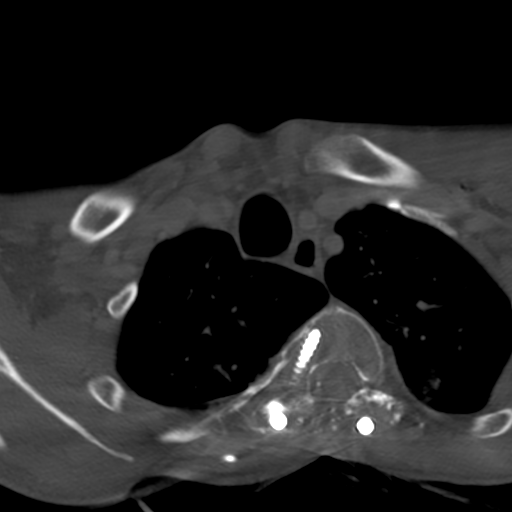
[im 52/130  bone]
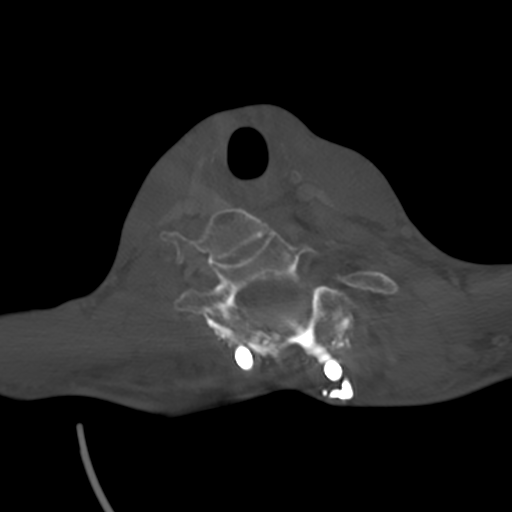

[14 of 33 positions shown; findings below may reference images not displayed]

FINDINGS: Pharynx and larynx: Negative larynx. There is a small volume of
retained secretions in the hypopharynx including both piriform
sinuses (series 3, image 65). Otherwise negative hypopharynx. Small
volume of retained secretions in the oropharynx. Negative
nasopharynx. Negative parapharyngeal and retropharyngeal spaces.

Salivary glands: Negative sublingual space. The left submandibular
gland does appear asymmetrically larger than the right gland (series
3, image 58 on the left versus image 46 on the right) but there is
no submandibular gland inflammation or mass. No sialolithiasis. The
left gland was somewhat larger in 8444 also. Both parotid glands are
within normal limits.

Thyroid: Negative.

Lymph nodes: Cervical lymph nodes are stable since 8444 and within
normal limits.

Vascular: Major vascular structures in the neck and at the skullbase
are patent.

Limited intracranial: Stable , negative.

Visualized orbits: Negative.

Mastoids and visualized paranasal sinuses: Mild ethmoid and sphenoid
sinus mucosal thickening. There is opacification of some left
anterior ethmoid air cells which is new since 8444 (series 6, image
1). The bilateral tympanic cavities and mastoids are clear.

Skeleton: Kyphoscoliosis and exaggerated cervical lordosis status
bilateral posterior spinal rod fusion. Solid appearing posterior
element arthrodesis throughout the visible spine. There is an upper
thoracic intrathecal catheter or neural stimulator which is
unchanged in position since 8444.

Upper chest: There is a 2 cm area of distal peribronchial opacity in
the right upper lobe today which appears inflammatory. The
previously-seen moderate to large right pleural effusion with
compressive atelectasis has resolved. Visible airways are patent.
The visible left lung is clear. No superior mediastinal or visible
axillary lymphadenopathy.
IMPRESSION: 1. No acute or inflammatory process identified in the neck (see #2).
No neck mass or lymphadenopathy. There is mild asymmetry of the
submandibular glands (the right is smaller) which seems chronic but
more pronounced since 8444. No associated sialolithiasis.
2. Small peripheral bronchopneumonia in the right upper lobe. No
right pleural effusion identified. There is a small volume of
layering retained secretions in the pharynx, but the visible trachea
and airways are clear.

## 2017-07-08 ENCOUNTER — Telehealth: Payer: Self-pay | Admitting: *Deleted

## 2017-07-08 NOTE — Telephone Encounter (Signed)
Received Physician Orders from [facility] for Catheter & Incontinence Supplies; forwarded to provider/SLS 09/06

## 2017-07-13 DIAGNOSIS — H10023 Other mucopurulent conjunctivitis, bilateral: Secondary | ICD-10-CM | POA: Diagnosis not present

## 2017-07-14 ENCOUNTER — Encounter: Payer: Self-pay | Admitting: Physical Medicine & Rehabilitation

## 2017-07-14 ENCOUNTER — Encounter: Payer: Self-pay | Admitting: *Deleted

## 2017-07-14 ENCOUNTER — Encounter: Payer: Medicare Other | Attending: Physical Medicine & Rehabilitation | Admitting: Physical Medicine & Rehabilitation

## 2017-07-14 VITALS — BP 111/75 | HR 92

## 2017-07-14 DIAGNOSIS — Z9889 Other specified postprocedural states: Secondary | ICD-10-CM | POA: Diagnosis not present

## 2017-07-14 DIAGNOSIS — G8250 Quadriplegia, unspecified: Secondary | ICD-10-CM

## 2017-07-14 DIAGNOSIS — F418 Other specified anxiety disorders: Secondary | ICD-10-CM | POA: Insufficient documentation

## 2017-07-14 DIAGNOSIS — K219 Gastro-esophageal reflux disease without esophagitis: Secondary | ICD-10-CM | POA: Diagnosis not present

## 2017-07-14 DIAGNOSIS — G800 Spastic quadriplegic cerebral palsy: Secondary | ICD-10-CM | POA: Diagnosis not present

## 2017-07-14 NOTE — Progress Notes (Signed)
Botox Injection for spasticity using needle EMG guidance Indication: Spastic tetraplegia (HCC)   Dilution: 100 Units/ml        Total Units Injected: 600 Indication: Severe spasticity which interferes with ADL,mobility and/or  hygiene and is unresponsive to medication management and other conservative care Informed consent was obtained after describing risks and benefits of the procedure with the patient. This includes bleeding, bruising, infection, excessive weakness, or medication side effects. A REMS form is on file and signed.  Needle: 55mm injectable monopolar needle electrode  Number of units per muscle  Quadriceps 300 units into each quad with 6 separate access points Gastroc/soleus 0 units Hamstrings 0 units Tibialis Posterior 0 units EHL 0 units All injections were done after obtaining appropriate EMG activity and after negative drawback for blood. The patient tolerated the procedure well. Post procedure instructions were given. Return in about 3 months (around 10/13/2017). Consider further botox at that time

## 2017-07-14 NOTE — Patient Instructions (Signed)
PLEASE FEEL FREE TO CALL OUR OFFICE WITH ANY PROBLEMS OR QUESTIONS ([phone])      

## 2017-07-29 ENCOUNTER — Encounter: Payer: Self-pay | Admitting: Occupational Therapy

## 2017-07-29 DIAGNOSIS — R29818 Other symptoms and signs involving the nervous system: Secondary | ICD-10-CM

## 2017-07-29 NOTE — Therapy (Signed)
[facility] [address] [place], [place], [address] Phone: [phone]   Fax:  [phone]  Occupational Therapy Treatment  Patient Details  Name: [name] MRN: [id number] Date of Birth: 1985-11-19 Referring Provider: Dr. Naaman Plummer  Encounter Date: 07/29/2017    Past Medical History:  Diagnosis Date  . Cerebral palsy ([place])   . Dehydration 11/22/2013  . Depression with anxiety 08/01/2010   Qualifier: Diagnosis of  By: Nelson-Smith CMA (AAMA), Dottie    . Esophagitis 2011  . 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 GERD (gastroesophageal reflux disease)   . Hyperlipidemia, mild 08/25/2015  . Hyperthyroidism   . Incontinence of feces   . Loss of weight 08/28/2014  . Medicare annual wellness visit, subsequent 08/25/2015  . Mildly underweight adult 03/16/2017  . Palpitations   . Skin lesion of right ear 03/16/2017    Past Surgical History:  Procedure Laterality Date  . baclofen trial    . baslofen pump implant    . ears tubes    . EYE SURGERY    . FLEXIBLE SIGMOIDOSCOPY N/A 09/07/2014   Procedure: FLEXIBLE SIGMOIDOSCOPY;  Surgeon: [name], MD;  Location: [facility] ENDOSCOPY;  Service: Endoscopy;  Laterality: N/A;  . g-tube insert  August 2006  . hamstring released     to treat contractures.   Marland Kitchen HIP SURGERY     x2 , side   . IR CM INJ ANY COLONIC TUBE W/FLUORO  05/28/2017  . IR GENERIC HISTORICAL  07/01/2016   IR GASTR TUBE CONVERT GASTR-JEJ PER W/FL MOD SED 07/01/2016 [name], MD [facility]INTER[facility] RAD  . IR GENERIC HISTORICAL  07/08/2016   IR PATIENT EVAL TECH 0-60 MINS 07/08/2016 [name], MD [facility]  . IR GENERIC HISTORICAL  07/14/2016   IR GJ TUBE CHANGE 07/14/2016 [name], MD [facility]  . IR GENERIC HISTORICAL  07/21/2016   IR PATIENT EVAL TECH 0-60 MINS [facility]  . IR GENERIC HISTORICAL  08/31/2016   IR [place] DUODEN/JEJUNO TUBE PERCUT W/FLUORO 08/31/2016 [name], MD [facility]  .  IR GENERIC HISTORICAL  09/03/2016   IR GJ TUBE CHANGE 09/03/2016 [name], MD [facility]  . IR GENERIC HISTORICAL  09/10/2016   IR GASTR TUBE CONVERT GASTR-JEJ PER W/FL MOD SED 09/10/2016 [facility]  . IR GENERIC HISTORICAL  09/16/2016   IR PATIENT EVAL TECH 0-60 MINS [facility]  . IR GENERIC HISTORICAL  09/29/2016   IR GJ TUBE CHANGE 09/29/2016 [name], MD [facility]  . IR GJ TUBE CHANGE  02/05/2017  . IR GJ TUBE CHANGE  05/21/2017  . PEG PLACEMENT  10/21/2011   Procedure: PERCUTANEOUS ENDOSCOPIC GASTROSTOMY (PEG) REPLACEMENT;  Surgeon: [name], MD;  Location: [facility] ENDOSCOPY;  Service: Endoscopy;  Laterality: N/A;  . PEG PLACEMENT N/A 06/13/2013   Procedure: PERCUTANEOUS ENDOSCOPIC GASTROSTOMY (PEG) REPLACEMENT;  Surgeon: [name], MD;  Location: [facility] ENDOSCOPY;  Service: Endoscopy;  Laterality: N/A;  . SPINAL FUSION    . spinal fusion to correct 70 degree kyphosis  11-2010  . spinal fusioncorrect 106 degree kyphosis    . SPINE SURGERY  ,11/20/2010, 2011   for correction of severe contracturing spinal kyphosis.   . TONSILLECTOMY      There were no vitals filed for this visit.  OT Long Term Goals - 08-19-17 1005      OT LONG TERM GOAL #1   Title Pt's caregiver and mom will be independent in HEP - 06/01/2017 (date adjusted to meet certication period and pt has attended 4/5 visits)   Status Achieved     OT LONG TERM GOAL #2   Title Caregiver and mom will be independent in splint wear and care if indicated (will need to further assess given pt's poor tolerance in the past).    Status Achieved               Plan - 2017/08/19 1005    Clinical Impression Statement Pt has not returned therefore assume no issues have arisen from splint and will d/c from OT at this time.   OT Treatment/Interventions Passive range of motion;Splinting;Patient/family education;DME and/or AE instruction      Patient will  benefit from skilled therapeutic intervention in order to improve the following deficits and impairments:  Decreased range of motion, Impaired UE functional use, Impaired tone, Pain  Visit Diagnosis: Other symptoms and signs involving the nervous system      G-Codes - 08/19/17 1006    Functional Assessment Tool Used (Outpatient only) skilled clinical observation pt unable to particpate in any standardized tests   Functional Limitation --  family's ability to care for pt post botox injection   Self Care Current Status (E7035) At least 1 percent but less than 20 percent impaired, limited or restricted   Self Care Goal Status (K0938) At least 1 percent but less than 20 percent impaired, limited or restricted   Self Care Discharge Status [phone]) At least 1 percent but less than 20 percent impaired, limited or restricted      Problem List Patient Active Problem List   Diagnosis Date Noted  . Mildly underweight adult 03/16/2017  . Skin lesion of right ear 03/16/2017  . Cerebral palsy ([place]) 04/14/2016  . Increased oropharyngeal secretions 04/14/2016  . Medicare annual wellness visit, subsequent 08/25/2015  . Hyperlipidemia, mild 08/25/2015  . Reflux 12/17/2014  . Rectal bleeding 09/06/2014  . Esophageal dysphagia 09/06/2014  . Dysphagia, pharyngoesophageal phase 09/02/2014  . Impetigo 09/02/2014  . Abnormal thyroid function test 06/10/2014  . Acute nonsuppurative otitis media of right ear 05/13/2014  . Acute bronchitis 02/27/2014  . Protein-calorie malnutrition, severe ([place]) 11/23/2013  . Decreased oral intake 11/22/2013  . 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 Spastic tetraplegia ([place]) 08/23/2013  . Cervical dystonia 08/23/2013  . Dysphagia, oropharyngeal phase 08/10/2013  . Low back pain 07/11/2013  . 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 Anemia 12/05/2010  . 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 GASTROSTOMY COMPLICATION 37/16/9678  . Infantile cerebral palsy ([place]) 08/04/2010  .  Thyrotoxicosis 08/01/2010  . Depression with anxiety 08/01/2010  . GERD 08/01/2010  . OTHER ACNE 10/16/2009  . PALPITATIONS 02/25/2009  . HIP PAIN, BILATERAL 11/06/2008  . NEVI, MULTIPLE 10/09/2008  . Dysphagia 04/02/2008  . Incontinence of feces 04/02/2008  . Urinary incontinence 04/02/2008  . 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 FACIAL RASH 04/29/2007  . PALSY, INFANTILE CEREBRAL, 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   OCCUPATIONAL THERAPY DISCHARGE SUMMARY  Visits from Start of Care: 5  Current functional level related to goals / functional outcomes: See above   Remaining deficits: See eval   Education / Equipment: HEP, Splints Plan: Patient agrees to discharge.  Patient goals were met. Patient is being discharged due to meeting the stated rehab goals.  ?????  Quay Burow , OTR/L 07/29/2017, 10:06 AM  [facility] [address] [place], [place], [address] Phone: [phone]   Fax:  [phone]  Name: [name] MRN: [id number] Date of Birth: Jul 08, 1986

## 2017-08-04 ENCOUNTER — Telehealth: Payer: Self-pay | Admitting: *Deleted

## 2017-08-04 NOTE — Telephone Encounter (Signed)
Received Physician Orders for Addendum to Care Plan from [facility]; forwarded to provider/SLS 10/03

## 2017-08-10 DIAGNOSIS — F339 Major depressive disorder, recurrent, unspecified: Secondary | ICD-10-CM | POA: Diagnosis not present

## 2017-08-12 ENCOUNTER — Ambulatory Visit: Payer: Self-pay | Admitting: Family Medicine

## 2017-08-19 ENCOUNTER — Ambulatory Visit (INDEPENDENT_AMBULATORY_CARE_PROVIDER_SITE_OTHER): Payer: Medicare Other | Admitting: Family Medicine

## 2017-08-19 ENCOUNTER — Encounter: Payer: Self-pay | Admitting: Family Medicine

## 2017-08-19 VITALS — BP 114/101 | HR 91 | Temp 97.7°F | Resp 18 | Wt 101.0 lb

## 2017-08-19 DIAGNOSIS — K219 Gastro-esophageal reflux disease without esophagitis: Secondary | ICD-10-CM

## 2017-08-19 DIAGNOSIS — J984 Other disorders of lung: Secondary | ICD-10-CM

## 2017-08-19 DIAGNOSIS — G809 Cerebral palsy, unspecified: Secondary | ICD-10-CM | POA: Diagnosis not present

## 2017-08-19 DIAGNOSIS — E43 Unspecified severe protein-calorie malnutrition: Secondary | ICD-10-CM

## 2017-08-19 DIAGNOSIS — D509 Iron deficiency anemia, unspecified: Secondary | ICD-10-CM | POA: Diagnosis not present

## 2017-08-19 DIAGNOSIS — M419 Scoliosis, unspecified: Secondary | ICD-10-CM | POA: Diagnosis not present

## 2017-08-19 DIAGNOSIS — E785 Hyperlipidemia, unspecified: Secondary | ICD-10-CM | POA: Diagnosis not present

## 2017-08-19 DIAGNOSIS — E079 Disorder of thyroid, unspecified: Secondary | ICD-10-CM | POA: Diagnosis not present

## 2017-08-19 HISTORY — DX: Hyperlipidemia, unspecified: E78.5

## 2017-08-19 LAB — COMPREHENSIVE METABOLIC PANEL
ALT: 25 U/L (ref 0–53)
AST: 20 U/L (ref 0–37)
Albumin: 4.1 g/dL (ref 3.5–5.2)
Alkaline Phosphatase: 76 U/L (ref 39–117)
BUN: 9 mg/dL (ref 6–23)
CO2: 31 mEq/L (ref 19–32)
Calcium: 9.3 mg/dL (ref 8.4–10.5)
Chloride: 101 mEq/L (ref 96–112)
Creatinine, Ser: 0.34 mg/dL — ABNORMAL LOW (ref 0.40–1.50)
GFR: 316.8 mL/min (ref >60.00)
Glucose, Bld: 98 mg/dL (ref 70–99)
Potassium: 3.9 mEq/L (ref 3.5–5.1)
Sodium: 138 mEq/L (ref 135–145)
Total Bilirubin: 0.3 mg/dL (ref 0.2–1.2)
Total Protein: 7.3 g/dL (ref 6.0–8.3)

## 2017-08-19 LAB — CBC
HCT: 43.7 % (ref 39.0–52.0)
Hemoglobin: 14.7 g/dL (ref 13.0–17.0)
MCHC: 33.6 g/dL (ref 30.0–36.0)
MCV: 92.4 fl (ref 78.0–100.0)
Platelets: 194 10*3/uL (ref 150.0–400.0)
RBC: 4.73 Mil/uL (ref 4.22–5.81)
RDW: 12.6 % (ref 11.5–15.5)
WBC: 4.2 10*3/uL (ref 4.0–10.5)

## 2017-08-19 LAB — LIPID PANEL
Cholesterol: 118 mg/dL (ref 0–200)
HDL: 35 mg/dL — ABNORMAL LOW (ref >39.00)
LDL Cholesterol: 70 mg/dL (ref 0–99)
NonHDL: 82.98
Total CHOL/HDL Ratio: 3
Triglycerides: 65 mg/dL (ref 0.0–149.0)
VLDL: 13 mg/dL (ref 0.0–40.0)

## 2017-08-19 LAB — TSH: TSH: 1.12 u[IU]/mL (ref 0.35–4.50)

## 2017-08-19 NOTE — Assessment & Plan Note (Signed)
PEG tube feedings, doing well. Check labs

## 2017-08-19 NOTE — Assessment & Plan Note (Signed)
6 pound weight gain since last visit. They are trying to get in 5 cans of his tube feeds instead of 4 as tolerated

## 2017-08-19 NOTE — Assessment & Plan Note (Signed)
Check tsh today 

## 2017-08-19 NOTE — Assessment & Plan Note (Signed)
Increase leafy greens, consider increased lean red meat and using cast iron cookware. Continue to monitor, report any concerns 

## 2017-08-19 NOTE — Patient Instructions (Signed)
Preventive Care 18-39 Years, Male Preventive care refers to lifestyle choices and visits with your health care provider that can promote health and wellness. What does preventive care include?  A yearly physical exam. This is also called an annual well check.  Dental exams once or twice a year.  Routine eye exams. Ask your health care provider how often you should have your eyes checked.  Personal lifestyle choices, including: ? Daily care of your teeth and gums. ? Regular physical activity. ? Eating a healthy diet. ? Avoiding tobacco and drug use. ? Limiting alcohol use. ? Practicing safe sex. What happens during an annual well check? The services and screenings done by your health care provider during your annual well check will depend on your age, overall health, lifestyle risk factors, and family history of disease. Counseling Your health care provider may ask you questions about your:  Alcohol use.  Tobacco use.  Drug use.  Emotional well-being.  Home and relationship well-being.  Sexual activity.  Eating habits.  Work and work environment.  Screening You may have the following tests or measurements:  Height, weight, and BMI.  Blood pressure.  Lipid and cholesterol levels. These may be checked every 5 years starting at age 20.  Diabetes screening. This is done by checking your blood sugar (glucose) after you have not eaten for a while (fasting).  Skin check.  Hepatitis C blood test.  Hepatitis B blood test.  Sexually transmitted disease (STD) testing.  Discuss your test results, treatment options, and if necessary, the need for more tests with your health care provider. Vaccines Your health care provider may recommend certain vaccines, such as:  Influenza vaccine. This is recommended every year.  Tetanus, diphtheria, and acellular pertussis (Tdap, Td) vaccine. You may need a Td booster every 10 years.  Varicella vaccine. You may need this if you  have not been vaccinated.  HPV vaccine. If you are 26 or younger, you may need three doses over 6 months.  Measles, mumps, and rubella (MMR) vaccine. You may need at least one dose of MMR.You may also need a second dose.  Pneumococcal 13-valent conjugate (PCV13) vaccine. You may need this if you have certain conditions and have not been vaccinated.  Pneumococcal polysaccharide (PPSV23) vaccine. You may need one or two doses if you smoke cigarettes or if you have certain conditions.  Meningococcal vaccine. One dose is recommended if you are age 19-21 years and a first-year college student living in a residence hall, or if you have one of several medical conditions. You may also need additional booster doses.  Hepatitis A vaccine. You may need this if you have certain conditions or if you travel or work in places where you may be exposed to hepatitis A.  Hepatitis B vaccine. You may need this if you have certain conditions or if you travel or work in places where you may be exposed to hepatitis B.  Haemophilus influenzae type b (Hib) vaccine. You may need this if you have certain risk factors.  Talk to your health care provider about which screenings and vaccines you need and how often you need them. This information is not intended to replace advice given to you by your health care provider. Make sure you discuss any questions you have with your health care provider. Document Released: 12/15/2001 Document Revised: 07/08/2016 Document Reviewed: 08/20/2015 Elsevier Interactive Patient Education  2017 Elsevier Inc.  

## 2017-08-19 NOTE — Assessment & Plan Note (Signed)
He has done much better this past year since getting his PEG tube placed and stopping po intake. No recurrent hospitalizations.

## 2017-08-19 NOTE — Assessment & Plan Note (Signed)
Very slight, will monitor

## 2017-08-19 NOTE — Assessment & Plan Note (Signed)
Check TB for patient participation in school program

## 2017-08-19 NOTE — Progress Notes (Signed)
Subjective:  I acted as a Education administrator for Dr. Charlett Blake. [name], [place]  Patient ID: [name], male    DOB: 02/22/1986, 31 y.o.   MRN: [id number]  No chief complaint on file.   HPI  Patient is in today for a follow up and completion of forms for his aftergateway program. He is brought in by an aide today as his parents have actually managed to get away to the beach for a few days since he is doing so well. No recent hospitalizations or febrile illness. Since having his PEG tube placed he has had many fewer respiratory concerns. They are getting in 4-5 cans of his feeds daily and he has gained 6 pounds since his last visit. Patient himself denies any pain today. Aide does not report the mother having any concerns. Forms brought in to be completed for his aftergateway program.  Patient Care Team: [name], MD as PCP - General (Family Medicine)   Past Medical History:  Diagnosis Date  . Cerebral palsy ([place])   . Dehydration 11/22/2013  . Depression with anxiety 08/01/2010   Qualifier: Diagnosis of  By: Nelson-Smith CMA (AAMA), Dottie    . Dyslipidemia 08/19/2017  . Esophagitis 2011  . Gastrostomy in place [facility]) 08/31/2013  . GERD (gastroesophageal reflux disease)   . Hyperlipidemia, mild 08/25/2015  . Hyperthyroidism   . Incontinence of feces   . Loss of weight 08/28/2014  . Medicare annual wellness visit, subsequent 08/25/2015  . Mildly underweight adult 03/16/2017  . Palpitations   . Skin lesion of right ear 03/16/2017  . Thyroid disease 08/01/2010   Qualifier: Diagnosis of  By: Harlon Ditty CMA (AAMA), Dottie      Past Surgical History:  Procedure Laterality Date  . baclofen trial    . baslofen pump implant    . ears tubes    . EYE SURGERY    . FLEXIBLE SIGMOIDOSCOPY N/A 09/07/2014   Procedure: FLEXIBLE SIGMOIDOSCOPY;  Surgeon: [name], MD;  [facility];  Service: Endoscopy;  Laterality: N/A;  . g-tube insert  August 2006  . hamstring released     to treat  contractures.   Marland Kitchen HIP SURGERY     x2 , side   . IR CM INJ ANY COLONIC TUBE W/FLUORO  05/28/2017  . IR GENERIC HISTORICAL  07/01/2016   IR GASTR TUBE CONVERT GASTR-JEJ PER W/FL MOD SED 07/01/2016 [name], MD [facility]INTER[facility]  . IR GENERIC HISTORICAL  07/08/2016   IR PATIENT EVAL TECH 0-60 MINS 07/08/2016 [name], MD [facility]  . IR GENERIC HISTORICAL  07/14/2016   IR GJ TUBE CHANGE 07/14/2016 [name], MD [facility]  . IR GENERIC HISTORICAL  07/21/2016   IR PATIENT EVAL TECH 0-60 MINS [facility]  . IR GENERIC HISTORICAL  08/31/2016   IR [place] DUODEN/JEJUNO TUBE PERCUT W/FLUORO 08/31/2016 [name], MD [facility]  . IR GENERIC HISTORICAL  09/03/2016   IR GJ TUBE CHANGE 09/03/2016 [name], MD [facility]  . IR GENERIC HISTORICAL  09/10/2016   IR GASTR TUBE CONVERT GASTR-JEJ PER W/FL MOD SED 09/10/2016 [facility]  . IR GENERIC HISTORICAL  09/16/2016   IR PATIENT EVAL TECH 0-60 MINS [facility]  . IR GENERIC HISTORICAL  09/29/2016   IR GJ TUBE CHANGE 09/29/2016 [name], MD [facility]  . IR GJ TUBE CHANGE  02/05/2017  . IR GJ TUBE CHANGE  05/21/2017  . PEG PLACEMENT  10/21/2011   Procedure: PERCUTANEOUS ENDOSCOPIC GASTROSTOMY (  PEG) REPLACEMENT;  Surgeon: [name], MD;  Location[facility] ENDOS[facility];  Service: Endoscopy;  Laterality: N/A;  . PEG PLACEMENT N/A 06/13/2013   Procedure: PERCUTANEOUS ENDOSCOPIC GASTROSTOMY (PEG) REPLACEMENT;  Surgeon: [name], MD;  Location: [facility] ENDOSCOPY;  Service: Endoscopy;  Laterality: N/A;  . SPINAL FUSION    . spinal fusion to correct 70 degree kyphosis  11-2010  . spinal fusioncorrect 106 degree kyphosis    . SPINE SURGERY  ,11/20/2010, 2011   for correction of severe contracturing spinal kyphosis.   . TONSILLECTOMY      Family History  Problem Relation Age of Onset  . Asthma Mother   . Hyperlipidemia Mother   . COPD Mother   . Other Mother        bronchial stasis/ABPA  . Cancer Maternal Grandmother 22        breast  . Hyperlipidemia Maternal Grandmother   . Hypertension Maternal Grandmother   . Cancer Maternal Grandfather        prostate  . Heart disease Paternal Grandfather        CHF  . Osteoporosis Paternal Grandmother   . Arthritis Paternal Grandmother        rheumatoid    Social History   Social History  . Marital status: Single    Spouse name: N/A  . Number of children: 0  . Years of education: N/A   Occupational History  . disbaled    Social History Main Topics  . Smoking status: Never Smoker  . Smokeless tobacco: Never Used  . Alcohol use No  . Drug use: No  . Sexual activity: No   Other Topics Concern  . Not on file   Social History Narrative  . No narrative on file    Outpatient Medications Prior to Visit  Medication Sig Dispense Refill  . AMBULATORY NON FORMULARY MEDICATION Medication Name: MIC gastrostomy/bolus feeding tube 24 French Part number 0110-24. #2 and 10 cc lurer lock syringe #2 Dx: 4 Device 2  . bacitracin 500 UNIT/GM ointment Apply 1 application topically 2 (two) times daily. 30 g 1  . clotrimazole-betamethasone (LOTRISONE) cream Apply 1 application topically 2 (two) times daily. 45 g 1  . dantrolene (DANTRIUM) 50 MG capsule TAKE 1 CAPSULE BY MOUTH 3 TIMES A DAY. 90 capsule 4  . diphenoxylate-atropine (LOMOTIL) 2.5-0.025 MG tablet Take 1-2 tablets via tub three times daily as needed for loose stool 90 tablet 2  . divalproex (DEPAKOTE SPRINKLE) 125 MG capsule Take 2 capsules in morning, 5 capsules at bedtime (Patient taking differently: 250-625 mg. Take 2 capsules in morning, 5 capsules at bedtime) 210 capsule 4  . Incontinence Supply Disposable (PREVAIL BREEZERS MEDIUM) MISC pkg of 16- size medium 32" to 44"  Breathable cloth-like outer fabric (can't use the plastic outer surgace  Item # [id number] 16 each 6  . LORazepam (ATIVAN) 1 MG tablet Take one tablet in evenings as needed for insomnia. 30 tablet 1  . mupirocin ointment (BACTROBAN) 2 %  Apply to area thin film twice daily if needed 22 g 0  . NON FORMULARY Bard Leg Bag Extension tubing w/Connector 18", Sterile, latex-free  Item# [id number]    . NON FORMULARY Colorplast Freedom Cath Latex Self-Adhering Male External Catheter 29mm Diameter Intermediate  Item# [id number]    . NONFORMULARY OR COMPOUNDED Suzy Bouchard REF [id number] - [place] Joey Pump Set with Flush Bags - 1000 mL 1 each 0  . Nutritional Supplements (FEEDING SUPPLEMENT, JEVITY 1.5 CAL,) LIQD Run tube feed  at 80 mL per hour over 15 hours. (3 cartons) 80 mL 11  . Nutritional Supplements (FEEDING SUPPLEMENT, VITAL 1.5 CAL,) LIQD Place 80 mLs into feeding tube continuous. 2 cartons 80 mL 11  . nystatin cream (MYCOSTATIN) Apply 1 application topically 2 (two) times daily as needed for dry skin. 30 g 1  . OLANZapine (ZYPREXA) 2.5 MG tablet Place 1 tablet into feeding tube 3 (three) times daily.  3  . Omeprazole-Sodium Bicarbonate (ZEGERID) 20-1100 MG CAPS capsule Take 1 capsule by mouth daily before breakfast.    . Ostomy Supplies (PROTECTIVE BARRIER WIPES) MISC 1-1/4" X 3"  Item #[id number] 75 each 6  . PARoxetine (PAXIL) 10 MG tablet Take 15 mg by mouth daily.    . sucralfate (CARAFATE) 1 g tablet Take 1 tablet (1 g total) by mouth 4 (four) times daily -  with meals and at bedtime. 120 tablet 0   No facility-administered medications prior to visit.     Allergies  Allergen Reactions  . Ambien [Zolpidem Tartrate] Nausea Only  . Antihistamines, Chlorpheniramine-Type     Other reaction(s): Other (See Comments) Other Reaction: agitation  . Codeine Other (See Comments)    Makes patient too active after a few days.  . Metoclopramide Other (See Comments)    Delusion, emotionality   . Baclofen Anxiety  . Sulfonamide Derivatives Rash    Review of Systems  Constitutional: Negative for fever and malaise/fatigue.  HENT: Negative for congestion.   Eyes: Positive for discharge. Negative for blurred vision.  Respiratory:  Negative for shortness of breath.   Cardiovascular: Negative for chest pain, palpitations and leg swelling.  Gastrointestinal: Negative for abdominal pain, blood in stool and nausea.  Genitourinary: Negative for dysuria and frequency.  Musculoskeletal: Negative for falls.  Skin: Negative for rash.  Neurological: Negative for dizziness, loss of consciousness and headaches.  Endo/Heme/Allergies: Negative for environmental allergies.  Psychiatric/Behavioral: Negative for depression. The patient is not nervous/anxious.        Objective:    Physical Exam  Constitutional: He is oriented to person, place, and time. He appears well-developed and well-nourished. No distress.  HENT:  Head: Normocephalic and atraumatic.  Eyes: Conjunctivae are normal.  Neck: Neck supple. No thyromegaly present.  Cardiovascular: Normal rate, regular rhythm and normal heart sounds.   No murmur heard. Pulmonary/Chest: Effort normal and breath sounds normal. No respiratory distress. He has no wheezes.  Abdominal: Soft. Bowel sounds are normal. He exhibits no mass. There is no tenderness.  PEG tube in place skin intact no surrounding fluctuance or erythema  Genitourinary:  Genitourinary Comments: Catheter bag in place. Urine light yellow, no cloudiness.   Musculoskeletal: He exhibits no edema.  Lymphadenopathy:    He has no cervical adenopathy.  Neurological: He is alert and oriented to person, place, and time. He displays abnormal reflex. He exhibits abnormal muscle tone.  Spastic tetraplegia. Contractures thoughout.  Skin: Skin is warm and dry.  Psychiatric: He has a normal mood and affect. His behavior is normal.    BP (!) 114/101 (BP Location: Left Arm, Patient Position: Sitting, Cuff Size: Normal)   Pulse 91   Temp 97.7 F (36.5 C) (Oral)   Resp 18   Wt 101 lb (45.8 kg)   SpO2 98%   BMI 18.47 kg/m  Wt Readings from Last 3 Encounters:  08/19/17 101 lb (45.8 kg)  03/16/17 95 lb (43.1 kg)  11/26/16  96 lb (43.5 kg)   BP Readings from Last 3 Encounters:  08/19/17 Marland Kitchen)  114/101  07/14/17 111/75  05/11/17 106/71     Immunization History  Administered Date(s) Administered  . Hep A / Hep B 04/07/2011, 05/14/2011  . Influenza Split 09/01/2011  . Influenza Whole 10/11/2007, 08/22/2008, 08/15/2009, 08/05/2010, 08/11/2012  . Influenza,inj,Quad PF,6+ Mos 08/06/2013, 07/16/2014, 08/15/2015, 08/27/2016  . PPD Test 04/07/2011  . Pneumococcal Conjugate-13 08/27/2016  . Pneumococcal Polysaccharide-23 08/15/2009  . Td 06/14/2002, 06/19/2009    Health Maintenance  Topic Date Due  . HIV Screening  03/16/1999  . INFLUENZA VACCINE  06/02/2017  . TETANUS/TDAP  06/20/2019    Lab Results  Component Value Date   WBC 6.2 09/02/2016   HGB 14.6 09/02/2016   HCT 42.5 09/02/2016   PLT 219.0 09/02/2016   GLUCOSE 83 08/04/2016   CHOL 118 08/04/2016   TRIG 67.0 08/04/2016   HDL 38.00 (L) 08/04/2016   LDLCALC 67 08/04/2016   ALT 17 08/04/2016   AST 16 08/04/2016   NA 140 08/04/2016   K 4.0 08/04/2016   CL 104 08/04/2016   CREATININE 0.34 (L) 08/04/2016   BUN 8 08/04/2016   CO2 29 08/04/2016   TSH 1.38 08/04/2016   HGBA1C 5.0 04/29/2007    Lab Results  Component Value Date   TSH 1.38 08/04/2016   Lab Results  Component Value Date   WBC 6.2 09/02/2016   HGB 14.6 09/02/2016   HCT 42.5 09/02/2016   MCV 91.3 09/02/2016   PLT 219.0 09/02/2016   Lab Results  Component Value Date   NA 140 08/04/2016   K 4.0 08/04/2016   CO2 29 08/04/2016   GLUCOSE 83 08/04/2016   BUN 8 08/04/2016   CREATININE 0.34 (L) 08/04/2016   BILITOT 0.4 08/04/2016   ALKPHOS 62 08/04/2016   AST 16 08/04/2016   ALT 17 08/04/2016   PROT 7.4 08/04/2016   ALBUMIN 4.1 08/04/2016   CALCIUM 9.2 08/04/2016   ANIONGAP 11 09/18/2014   GFR 318.84 08/04/2016   Lab Results  Component Value Date   CHOL 118 08/04/2016   Lab Results  Component Value Date   HDL 38.00 (L) 08/04/2016   Lab Results  Component Value  Date   LDLCALC 67 08/04/2016   Lab Results  Component Value Date   TRIG 67.0 08/04/2016   Lab Results  Component Value Date   CHOLHDL 3 08/04/2016   Lab Results  Component Value Date   HGBA1C 5.0 04/29/2007         Assessment & Plan:   Problem List Items Addressed This Visit    Thyroid disease    Check tsh today      Relevant Orders   TSH   Infantile cerebral palsy ([place])    He has done much better this past year since getting his PEG tube placed and stopping po intake. No recurrent hospitalizations.      GERD    PEG tube feedings, doing well. 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 Continue to monitor, report any concerns      Relevant Orders   CBC   Protein-calorie malnutrition, severe ([place])    6 pound weight gain since last visit. They are trying to get in 5 cans  of his tube feeds instead of 4 as tolerated      RESOLVED: Cerebral palsy ([place]) - Primary   Relevant Orders   QuantiFERON-TB Gold Plus   Dyslipidemia    Very slight, will monitor      Relevant Orders   Lipid panel      I am having Mr. Broaden maintain his nystatin cream, divalproex, clotrimazole-betamethasone, Omeprazole-Sodium Bicarbonate, NON FORMULARY, NON FORMULARY, Protective Barrier Wipes, PREVAIL BREEZERS MEDIUM, bacitracin, NONFORMULARY OR COMPOUNDED ITEM, LORazepam, PARoxetine, AMBULATORY NON FORMULARY MEDICATION, OLANZapine, dantrolene, mupirocin ointment, diphenoxylate-atropine, feeding supplement (JEVITY 1.5 CAL), feeding supplement (VITAL 1.5 CAL), and sucralfate.  No orders of the defined types were placed in this encounter.   CMA served as Education administrator during this visit. History, Physical and Plan performed  by medical provider. Documentation and orders reviewed and attested to.  [name], MD

## 2017-08-21 LAB — QUANTIFERON TB GOLD ASSAY (BLOOD)
Mitogen-Nil: >10 IU/mL
QUANTIFERON(R)-TB GOLD: NEGATIVE
Quantiferon Nil Value: 0.02 IU/mL
Quantiferon Tb Ag Minus Nil Value: 0.01 IU/mL

## 2017-08-21 LAB — TIQ-MISC

## 2017-08-23 ENCOUNTER — Telehealth: Payer: Self-pay | Admitting: Family Medicine

## 2017-08-23 NOTE — Telephone Encounter (Signed)
°  Relation to pt: mother / Connye Burkitt Call back number: [phone]   Reason for call:  Patient last seen 08/19/17 by PCP, mother was not in attendance. Mrs. Mccauley  would like to know if patient received flu shot and would like to know if she can pick up paperwork on Wednesday, she will be in the office for lab work, please advise

## 2017-08-24 NOTE — Telephone Encounter (Signed)
Patient did not receive his flu shot   Paperwork is on my desk

## 2017-08-25 ENCOUNTER — Encounter (HOSPITAL_COMMUNITY): Payer: Self-pay | Admitting: Interventional Radiology

## 2017-08-25 ENCOUNTER — Other Ambulatory Visit (HOSPITAL_COMMUNITY): Payer: Self-pay | Admitting: Interventional Radiology

## 2017-08-25 ENCOUNTER — Ambulatory Visit (HOSPITAL_COMMUNITY)
Admission: RE | Admit: 2017-08-25 | Discharge: 2017-08-25 | Disposition: A | Discharge status: Home / Self Care | Payer: Medicare Other | Source: Ambulatory Visit | Attending: Radiology | Admitting: Radiology

## 2017-08-25 ENCOUNTER — Other Ambulatory Visit (HOSPITAL_COMMUNITY): Payer: Self-pay | Admitting: Radiology

## 2017-08-25 DIAGNOSIS — R633 Feeding difficulties, unspecified: Secondary | ICD-10-CM

## 2017-08-25 DIAGNOSIS — Y733 Surgical instruments, materials and gastroenterology and urology devices (including sutures) associated with adverse incidents: Secondary | ICD-10-CM | POA: Insufficient documentation

## 2017-08-25 DIAGNOSIS — K9413 Enterostomy malfunction: Secondary | ICD-10-CM | POA: Insufficient documentation

## 2017-08-25 DIAGNOSIS — K9423 Gastrostomy malfunction: Secondary | ICD-10-CM | POA: Diagnosis not present

## 2017-08-25 HISTORY — PX: IR GJ TUBE CHANGE: IMG1440

## 2017-08-25 MED ORDER — IOPAMIDOL (ISOVUE-300) INJECTION 61%: 50.0000 mL | Freq: Once | INTRAVENOUS | Status: DC | PRN

## 2017-08-25 MED ORDER — IOPAMIDOL (ISOVUE-300) INJECTION 61%
INTRAVENOUS | Status: AC
  Filled 2017-08-25: qty 50

## 2017-08-25 NOTE — Telephone Encounter (Signed)
Called patients mom she will come get copy of his paperwork

## 2017-08-26 DIAGNOSIS — Z23 Encounter for immunization: Secondary | ICD-10-CM | POA: Diagnosis not present

## 2017-09-02 ENCOUNTER — Ambulatory Visit: Payer: Self-pay | Admitting: Family Medicine

## 2017-09-11 IMAGING — XA IR REPLACE G/J TUBE W/ FLUORO
1 series · 4 of 4 positions shown · non-contrast
Comparison: none

INDICATION: 30-year-old male with a history of cerebral palsy and
gastrojejunostomy tube.

[Series 300: ir gj tube change · 4 of 4 slices shown]
[im 1/4]
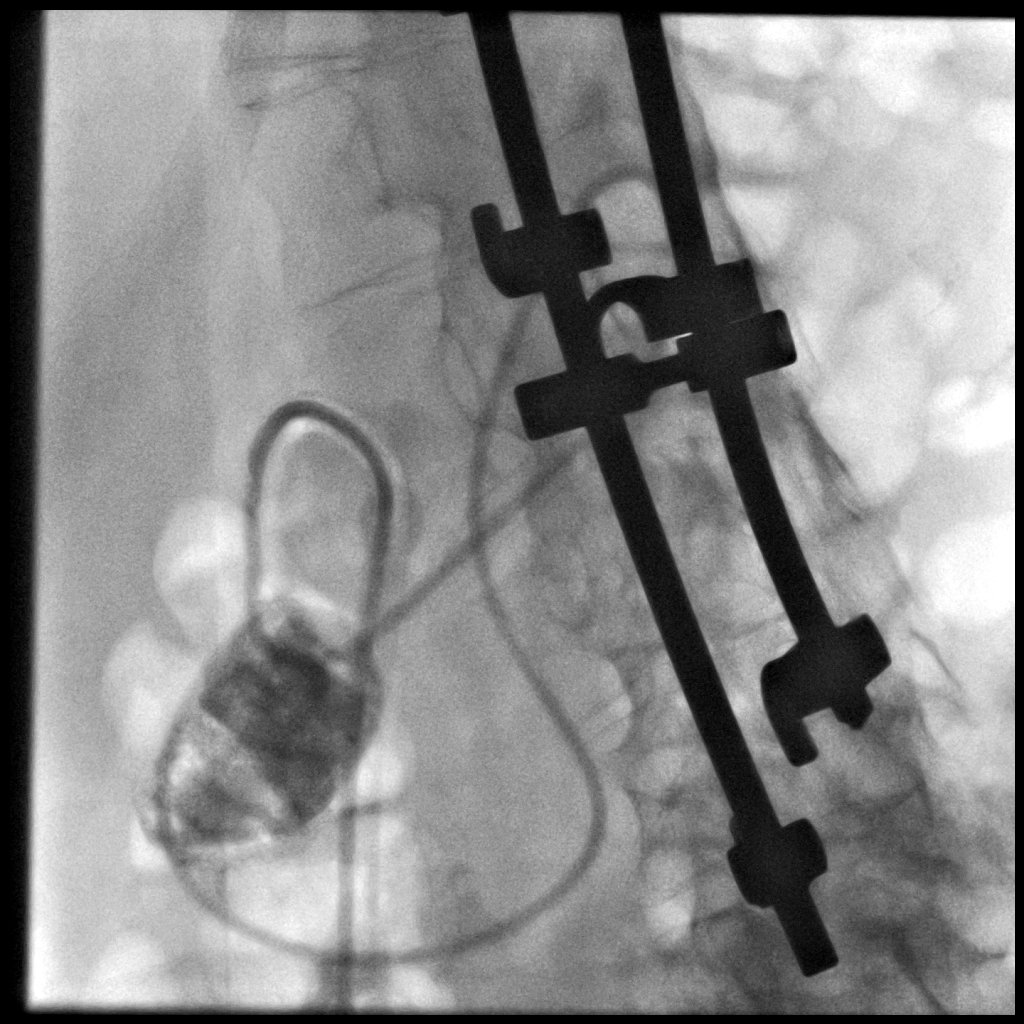
[im 2/4]
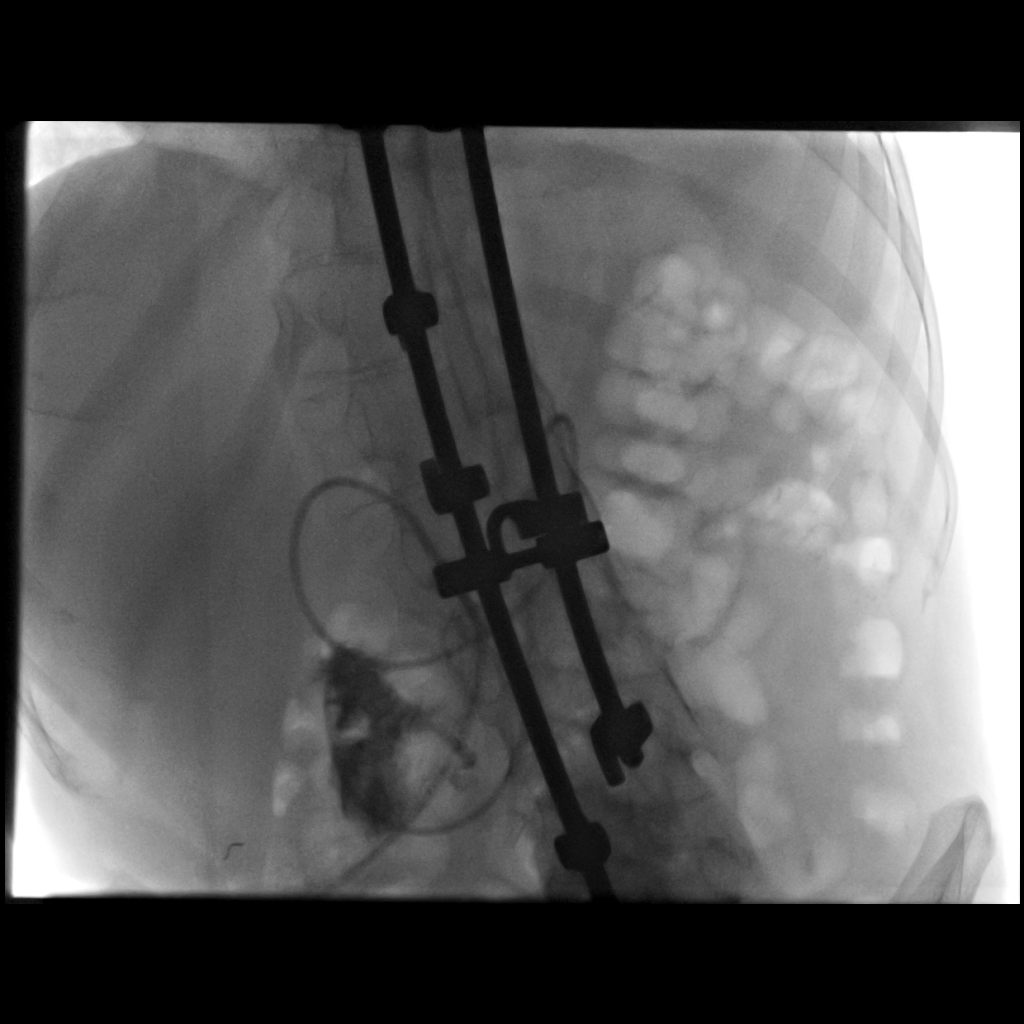
[im 3/4]
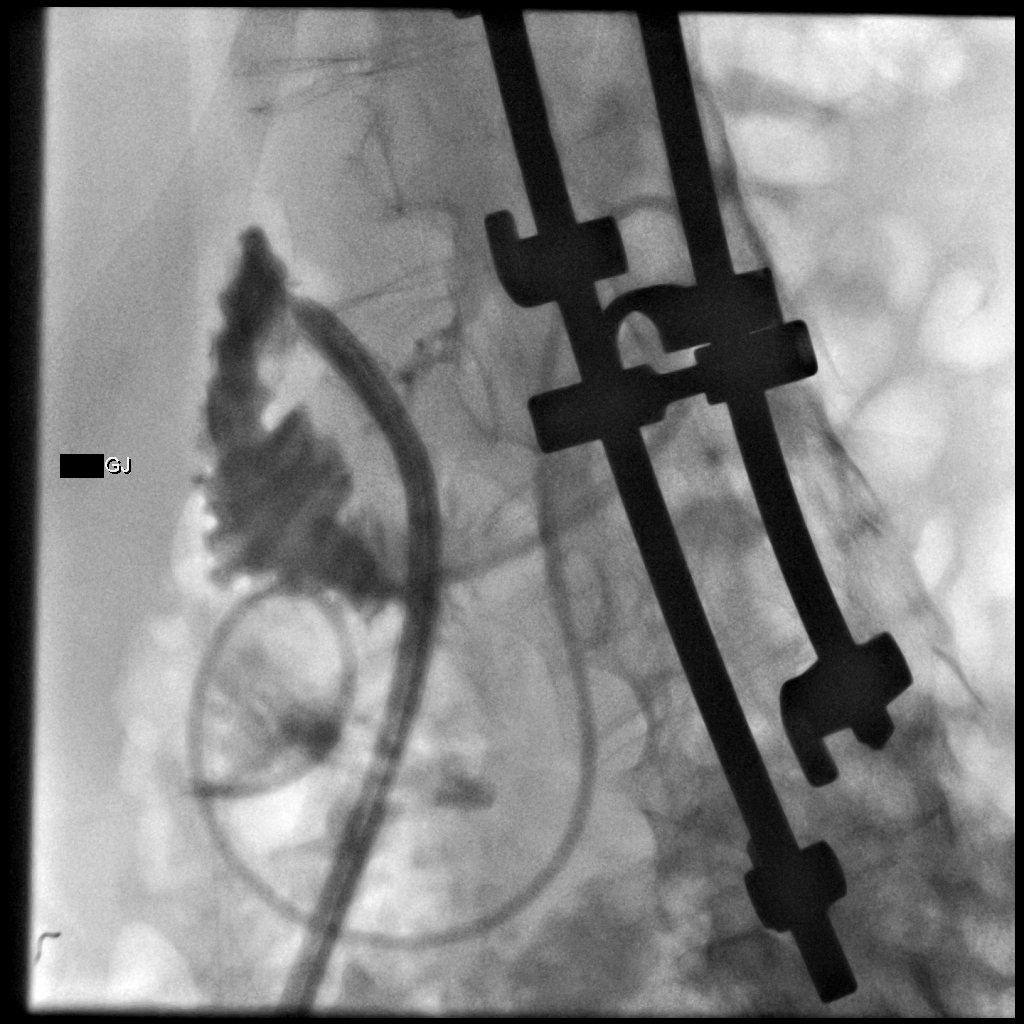
[im 4/4]
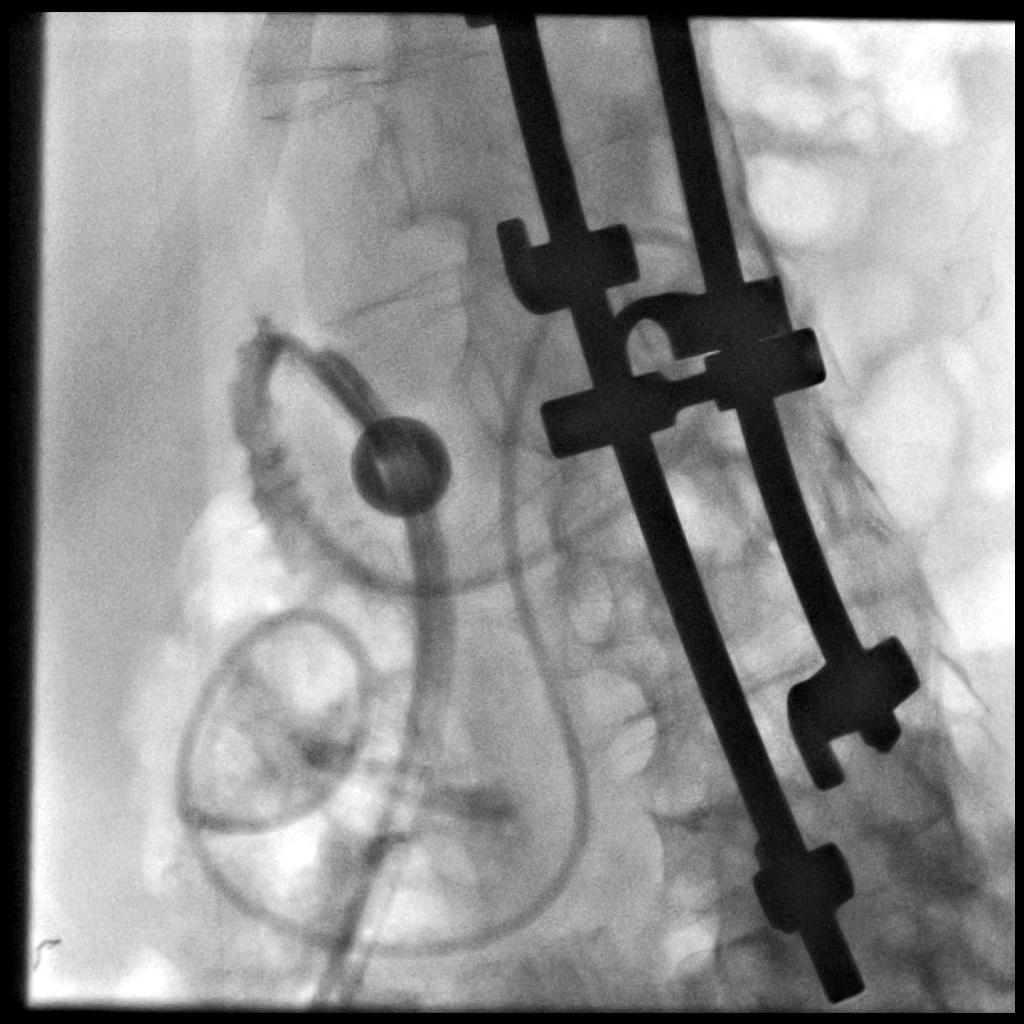

[4 of 4 positions shown; findings below may reference images not displayed]

The indwelling tube has become damaged.

EXAM:
FLUORO GUIDED EXCHANGE OF GASTROJEJUNOSTOMY

MEDICATIONS:
None

ANESTHESIA/SEDATION:
None

CONTRAST:  10 cc - administered into the gastric lumen.

FLUOROSCOPY TIME:  Fluoroscopy Time: 4 minutes 30 seconds (35 mGy).

COMPLICATIONS:
None

PROCEDURE:
Informed written consent was obtained from the patient and the
patient's family after a thorough discussion of the procedural
risks, benefits and alternatives. All questions were addressed.
Maximal Sterile Barrier Technique was utilized including caps, mask,
sterile gowns, sterile gloves, sterile drape, hand hygiene and skin
antiseptic. A timeout was performed prior to the initiation of the
procedure.

Patient positioned supine position on the fluoroscopy table. Scout
images of the abdomen were stored and sent to PACs.

Patient is prepped and draped in the usual sterile fashion.

The indwelling catheter balloon was deflated demonstrating
discoloration of the saline within the bulb, with much less than 10
cc present.

Stiff Glidewire was attempted to pass through the catheter, with
obstruction just after the skin site. Back and of the stiff
glidewire passed through this damage section proving patency, with
the back end removed.

A 4 French glide catheter was then passed through the jejunal lumen,
which helped to navigate the Glidewire through the damaged section.
Glidewire was advanced into the jejunum with the assistance of the
coaxial 4 French glide catheter.

The glide catheter and the gastrojejunostomy tube were removed over
the Glidewire.

The new 24 French gastrojejunostomy was modified with amputation of
the weighted tip. The catheter was then advanced over the Glidewire.
The distal jejunal catheter passed easily through the pylorus and
into the proximal small bowel, however, the wire buckled upon
attempted passage of the proximal catheter through the pylorus. Four
French glide catheter was then pass through the lumen over the
Glidewire for extra support, and the GJ was then advanced distally
over the coaxial wire and catheter.

Once the catheter was in position, the balloon was inflated, though
determined to be within the pylorus. Balloon was deflated, GJ was
withdrawn, balloon was then reinflated with contrast. Small amount
of contrast infusion through the gastrostomy port confirmed location
in the stomach lumen. Balloon was then deflated emptying the
contrast solution, and reinflated with saline.

Final image was stored after infusion of the jejunal limb with
contrast.

Patient tolerated the procedure well and remained hemodynamically
stable throughout.

No complications were encountered and no significant blood loss.
IMPRESSION: Status post exchange of damage gastrojejunostomy, with placement of
a new 24 French feeding tube.

## 2017-09-30 ENCOUNTER — Telehealth: Payer: Self-pay | Admitting: *Deleted

## 2017-09-30 NOTE — Telephone Encounter (Signed)
Received Physician Orders from [facility]; forwarded to provider/SLS 11/29

## 2017-10-04 ENCOUNTER — Telehealth: Payer: Self-pay | Admitting: *Deleted

## 2017-10-04 NOTE — Telephone Encounter (Signed)
Received Physician Orders from [facility];  forwarded to provider/SLS 12/03

## 2017-10-12 ENCOUNTER — Encounter: Payer: Medicaid Other | Admitting: Physical Medicine & Rehabilitation

## 2017-10-19 ENCOUNTER — Ambulatory Visit: Payer: Self-pay | Admitting: Family Medicine

## 2017-10-20 ENCOUNTER — Other Ambulatory Visit: Payer: Self-pay | Admitting: Family Medicine

## 2017-10-20 ENCOUNTER — Encounter: Payer: Self-pay | Admitting: Family Medicine

## 2017-10-20 MED ORDER — CEFDINIR 300 MG PO CAPS: 300.0000 mg | ORAL_CAPSULE | Freq: Two times a day (BID) | ORAL | 0 refills | Status: AC

## 2017-10-25 ENCOUNTER — Telehealth: Payer: Self-pay

## 2017-10-25 NOTE — Telephone Encounter (Signed)
[facility] Follow up call made to patients mother regarding call made to [facility]. Left message for return call. Mother states son has been lethargic and feels it came from Cefdinir he was given for cough. States she stopped medication and start Gatorade states the did not want to go to ED at the time (10-23-17)

## 2017-10-25 NOTE — Telephone Encounter (Signed)
patietns mother back per Dr. Lorelei Pont who states Cefdinir should not cause lethargy. Per mother she has seen some improvement in patient since stopping however he is not back to baselien. Advised Dr. Lorelei Pont feels he needs to be seen in Ed. Mother states she is giving him fluids, Mucinex and suctioning. States if he is no better Wed or Thurday she will bring patient in.

## 2017-10-25 NOTE — Telephone Encounter (Signed)
This is probably for Dr. Janett Billow Yola Paradiso or Dr. Frederico Hamman Adynn Caseres. Thank you .

## 2017-11-12 ENCOUNTER — Encounter: Payer: Self-pay | Admitting: Physical Medicine & Rehabilitation

## 2017-11-22 ENCOUNTER — Encounter: Payer: Medicare Other | Attending: Physical Medicine & Rehabilitation | Admitting: Physical Medicine & Rehabilitation

## 2017-11-22 ENCOUNTER — Encounter: Payer: Self-pay | Admitting: Physical Medicine & Rehabilitation

## 2017-11-22 ENCOUNTER — Other Ambulatory Visit: Payer: Self-pay

## 2017-11-22 VITALS — BP 133/67 | HR 87

## 2017-11-22 DIAGNOSIS — G8250 Quadriplegia, unspecified: Secondary | ICD-10-CM | POA: Diagnosis not present

## 2017-11-22 NOTE — Progress Notes (Signed)
Botox Injection for spasticity using needle EMG guidance Indication: Spastic tetraplegia (HCC)   Dilution: 100 Units/ml        Total Units Injected: 600 Indication: Severe spasticity which interferes with ADL,mobility and/or  hygiene and is unresponsive to medication management and other conservative care Informed consent was obtained after describing risks and benefits of the procedure with the patient. This includes bleeding, bruising, infection, excessive weakness, or medication side effects. A REMS form is on file and signed.  Needle: 72mm injectable monopolar needle electrode  Number of units per muscle  Quadriceps 200 units bilaterally in 4 access points each leg Left FDP and FDS 125 units Left PT 25 units Left FCR 25 units Left FCU 25 units   All injections were done after obtaining appropriate EMG activity and after negative drawback for blood. The patient tolerated the procedure well. Post procedure instructions were given. Return in about 3 months (around 02/20/2018).

## 2017-11-22 NOTE — Patient Instructions (Signed)
PLEASE FEEL FREE TO CALL OUR OFFICE WITH ANY PROBLEMS OR QUESTIONS ([phone])      

## 2017-12-03 ENCOUNTER — Telehealth: Payer: Self-pay | Admitting: *Deleted

## 2017-12-03 NOTE — Telephone Encounter (Signed)
Received Physician Orders/Plan of Care from [facility]; forwarded to provider/SLS 02/01

## 2017-12-08 NOTE — Telephone Encounter (Signed)
Plan of care- addendum signed and faxed to [facility] at [phone]. Form sent for scanning.

## 2017-12-21 DIAGNOSIS — F339 Major depressive disorder, recurrent, unspecified: Secondary | ICD-10-CM | POA: Diagnosis not present

## 2017-12-25 IMAGING — XA IR REPLACE G/J TUBE W/ FLUORO
2 series · 6 of 6 positions shown · non-contrast
Comparison: none

INDICATION: 31-year-old male with dysphagia and cerebral palsy, presents with
fracture of gastrojejunostomy tube

[Series 2: fl - angio · 4 of 61 frames shown]
[frame 10/61]
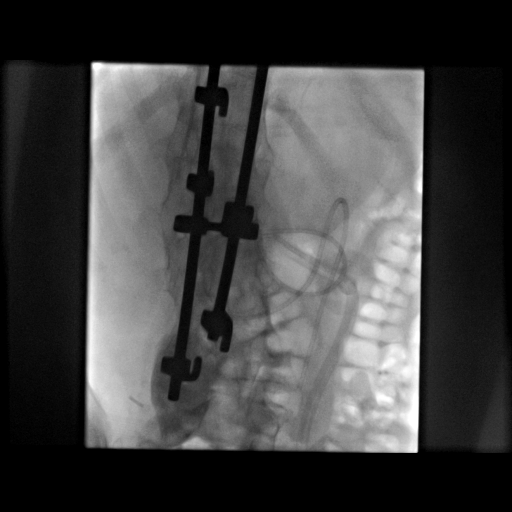
[frame 31/61]
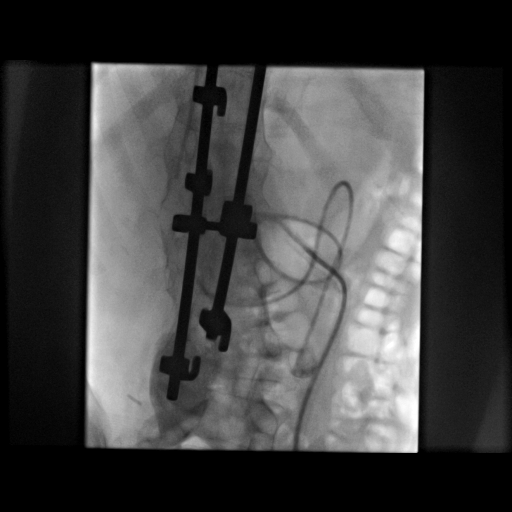
[frame 35/61]
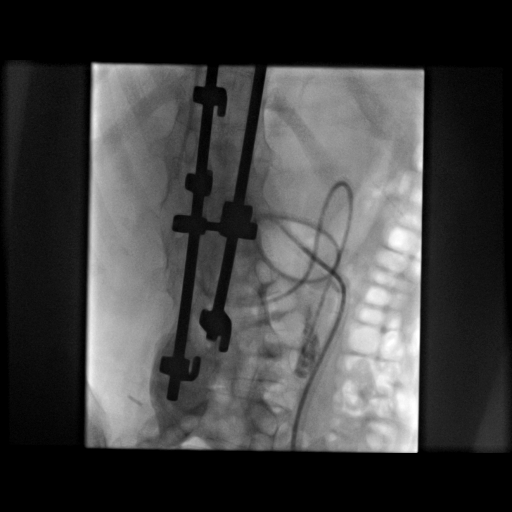
[frame 52/61]
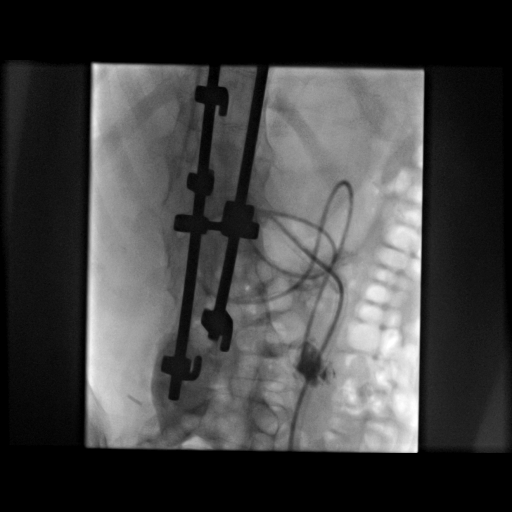

[Series 300: tube placements · 2 of 2 slices shown]
[im 1/2]
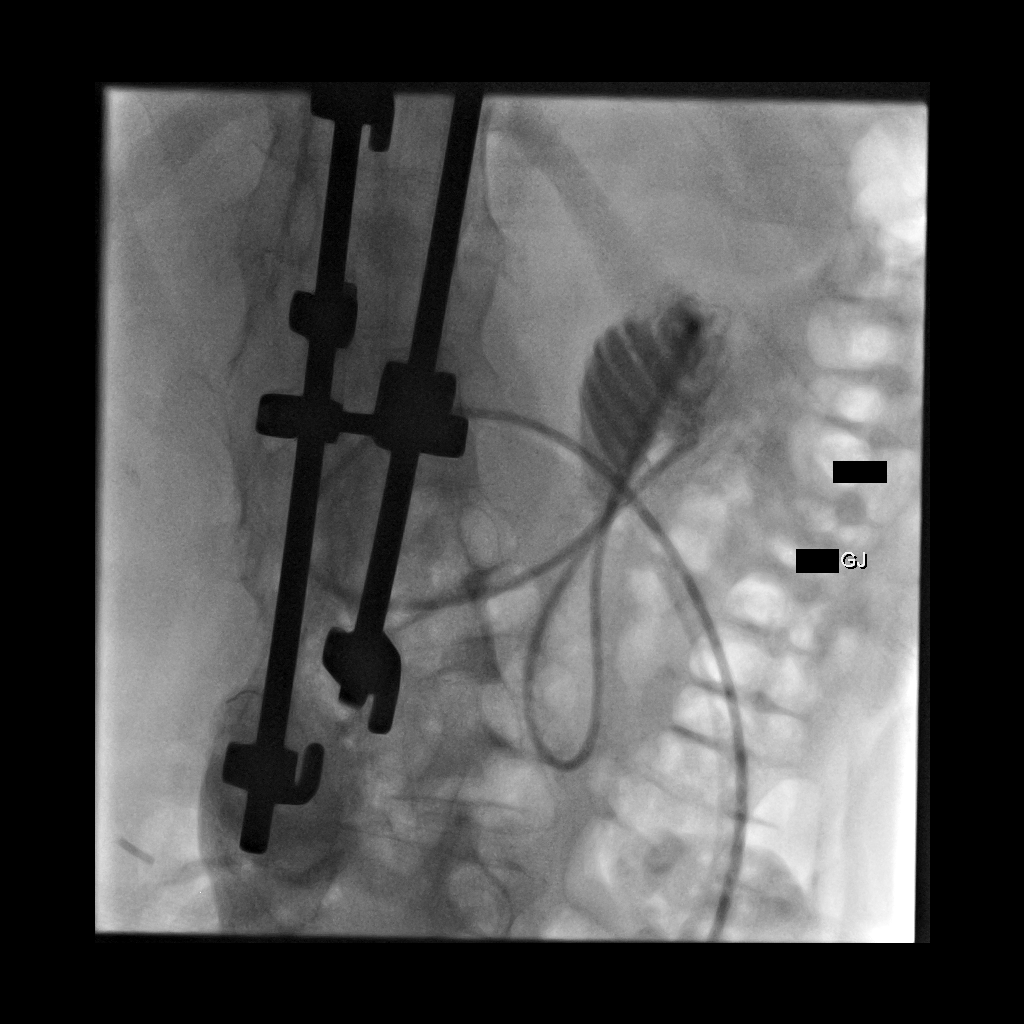
[im 2/2]
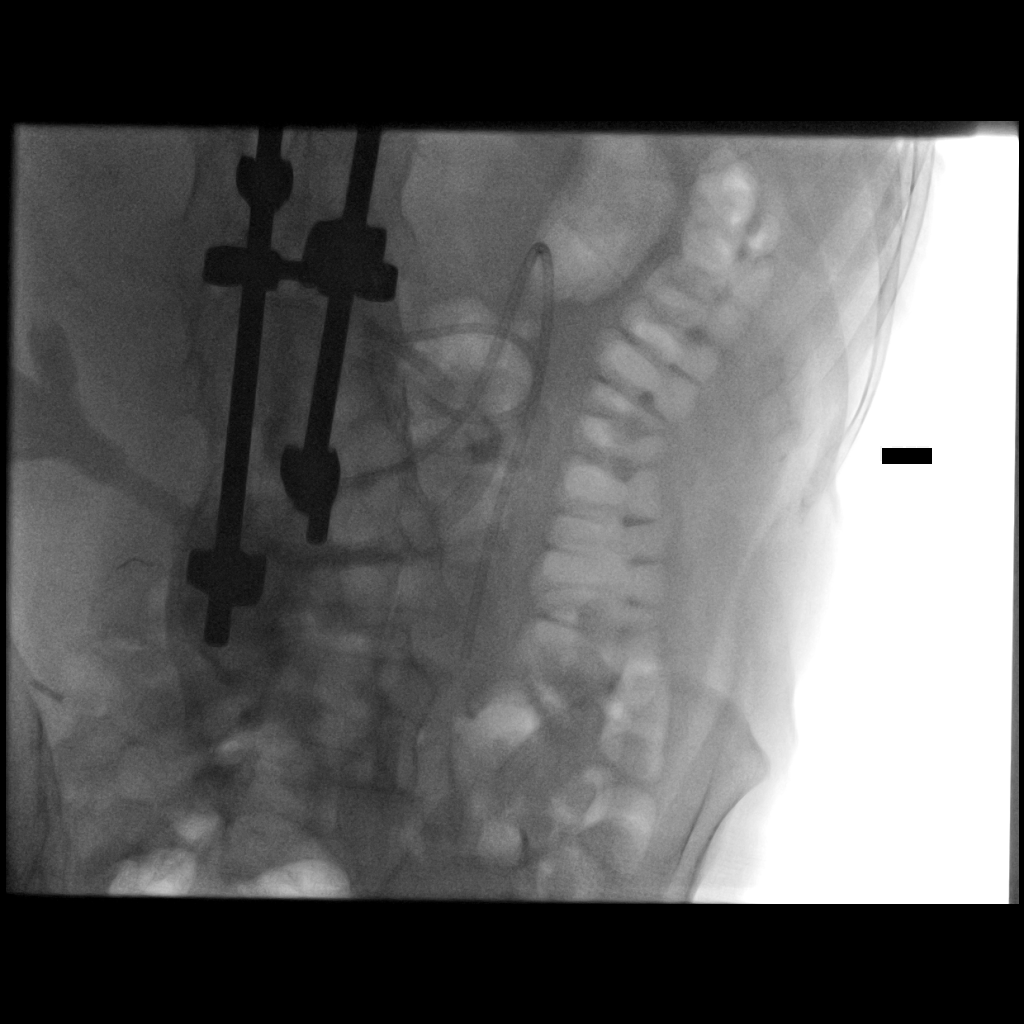

[6 of 6 positions shown; findings below may reference images not displayed]

EXAM:
IMAGE GUIDED EXCHANGE OF PERCUTANEOUS GASTROJEJUNOSTOMY

MEDICATIONS:
None

ANESTHESIA/SEDATION:
None

CONTRAST:  25 cc - administered into the gastrointestinal lumen.

FLUOROSCOPY TIME:  Fluoroscopy Time: 7 minutes 36 seconds (52 mGy).

COMPLICATIONS:
None

PROCEDURE:
Informed written consent was obtained from the patient after a
thorough discussion of the procedural risks, benefits and
alternatives. All questions were addressed. Maximal Sterile Barrier
Technique was utilized including caps, mask, sterile gowns, sterile
gloves, sterile drape, hand hygiene and skin antiseptic. A timeout
was performed prior to the initiation of the procedure.

Patient positioned supine position on the interventional table. The
upper abdomen and the tube were prepped and draped in the usual
sterile fashion.

Scout images were acquired.

Contrast was injected confirming location within the proximal small
bowel.

Combination of a straight tip 4 French glide cath and a standard
Glidewire were used to navigate the Glidewire into the jejunostomy
port/tube. One the Glidewire was advanced through the tip of the
catheter within the proximal small bowel, the balloon was deflated
and the catheter was withdrawn from the Glidewire. Given the
mismatch and the wire length and tube length, the tube was amputated
in the mid segment, removed from the back and the Glidewire. The
remaining section was then removed over the Glidewire.

Once the jejunostomy tube and Glidewire were removed, a new straight
tip glide cath was placed over the wire into the duodenum. Glidewire
was removed and 035 guidewire was placed into the fourth portion of
the duodenum. A new 24 French gastrojejunostomy tube was modified by
amputation the tube approximately 5 cm proximal to the weighted tip.
Catheter was then advanced over the wire. Wire was removed and
contrast injected confirmed location.

Approximately 8 cc of saline was infused into the lumen of the
balloon retention.

Final image was stored.

Patient tolerated the procedure well and remained hemodynamically
stable throughout.

No complications were encountered and no significant blood loss.
IMPRESSION: Status post exchange of gastrojejunostomy, with placement of a new
24 French tube, modified by amputated Ortuno approximately 5 cm
proximal to the weighted tip.

## 2017-12-27 DIAGNOSIS — H00022 Hordeolum internum right lower eyelid: Secondary | ICD-10-CM | POA: Diagnosis not present

## 2017-12-27 DIAGNOSIS — H00021 Hordeolum internum right upper eyelid: Secondary | ICD-10-CM | POA: Diagnosis not present

## 2017-12-29 ENCOUNTER — Encounter: Payer: Self-pay | Admitting: Family Medicine

## 2017-12-31 NOTE — Telephone Encounter (Signed)
Spoke with Aldona Bar at [facility]. Pt's prescription is still active for the Vital and Jevity through 03/16/18. She states that refills should be needed on both at the same time and is wanting to know if we are making a change in his treatment.  If so, they will need a new order with reason for the change. Sent mychart message requesting clarification of their request.

## 2018-01-01 IMAGING — XA IR GI TUBE CONTRAST INJECTION
2 series · 10 of 10 positions shown · non-contrast
Comparison: none

INDICATION: Pain at entry site of gastrojejunal feeding tube. This tube was
recently exchanged on 05/21/2017.

[Series 4: fl - angio · 4 of 69 frames shown]
[frame 11/69]
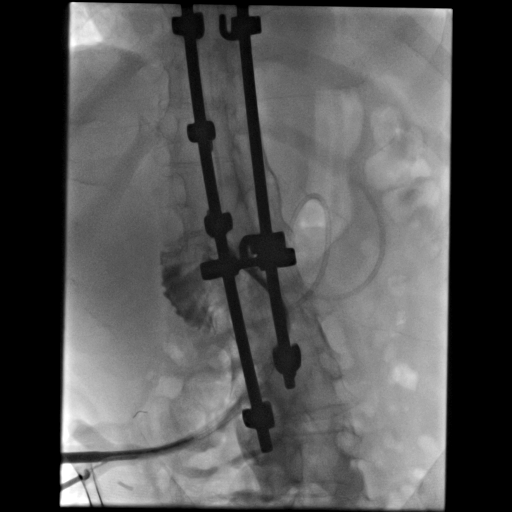
[frame 14/69]
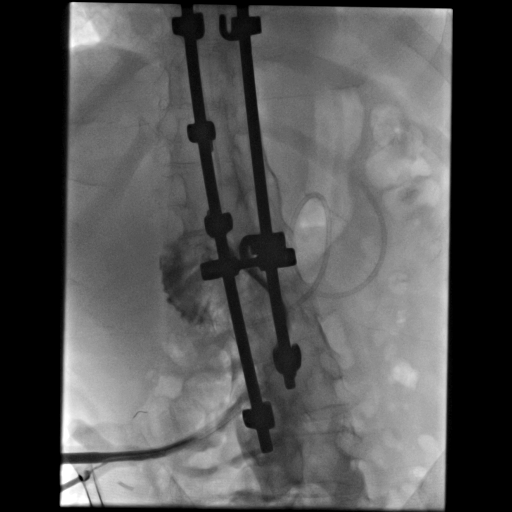
[frame 35/69]
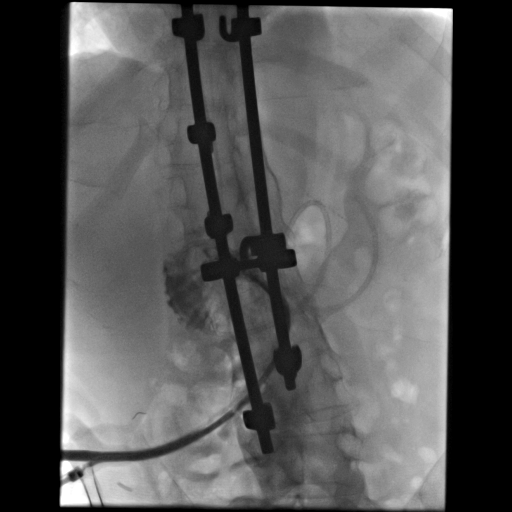
[frame 59/69]
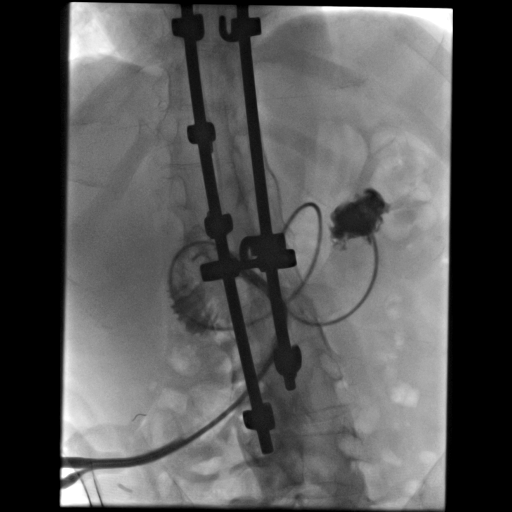

[Series 300: line placements · 6 of 6 slices shown]
[im 1/6]
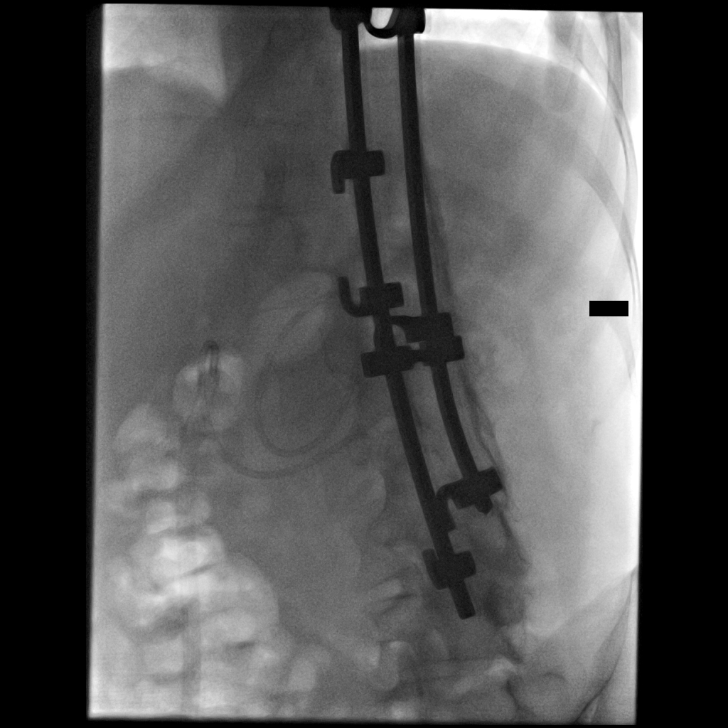
[im 2/6]
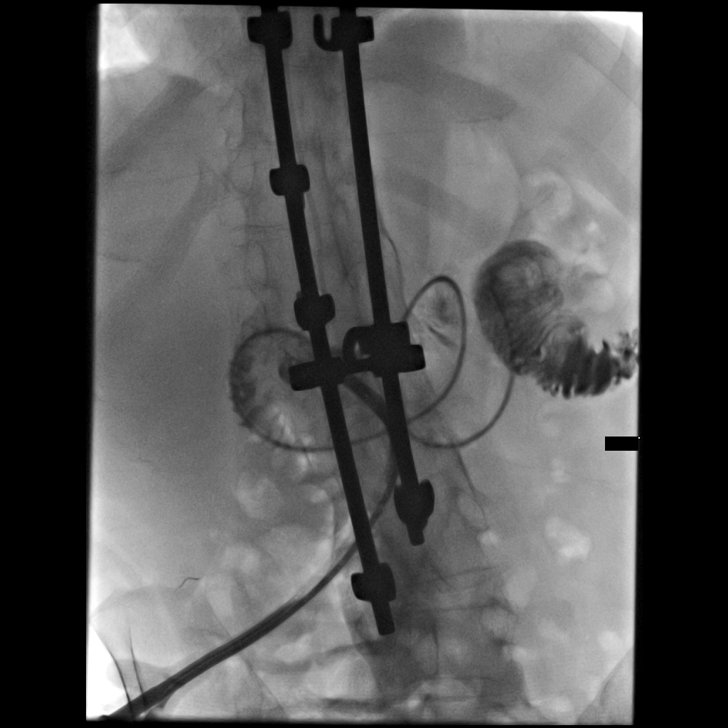
[im 3/6]
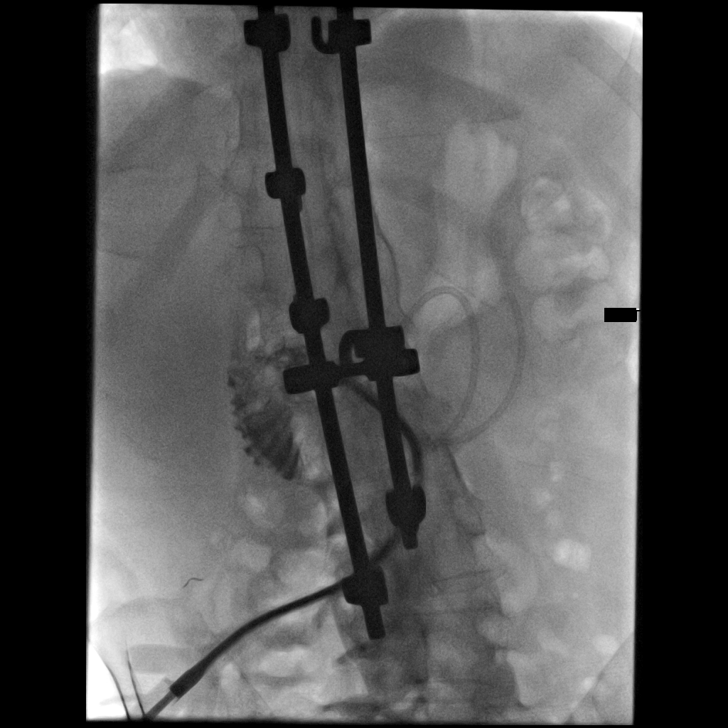
[im 4/6]
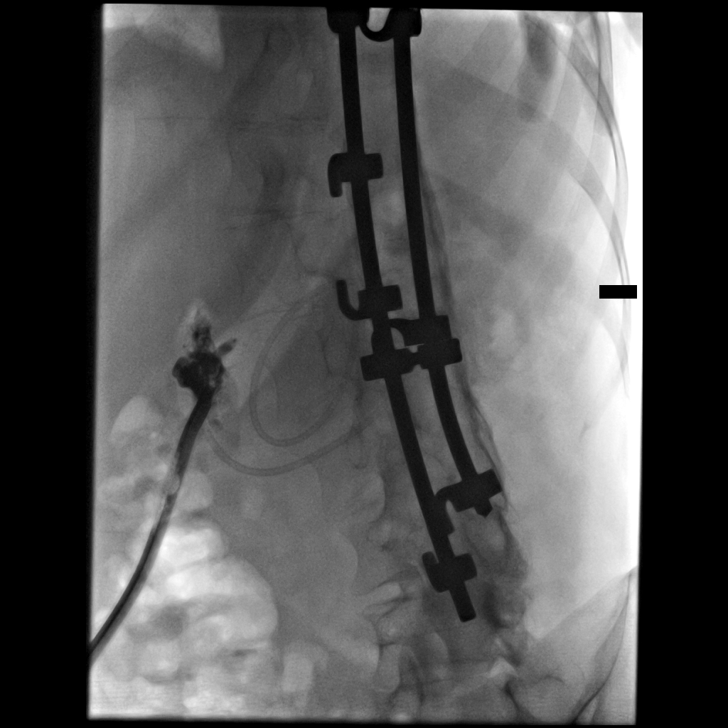
[im 5/6]
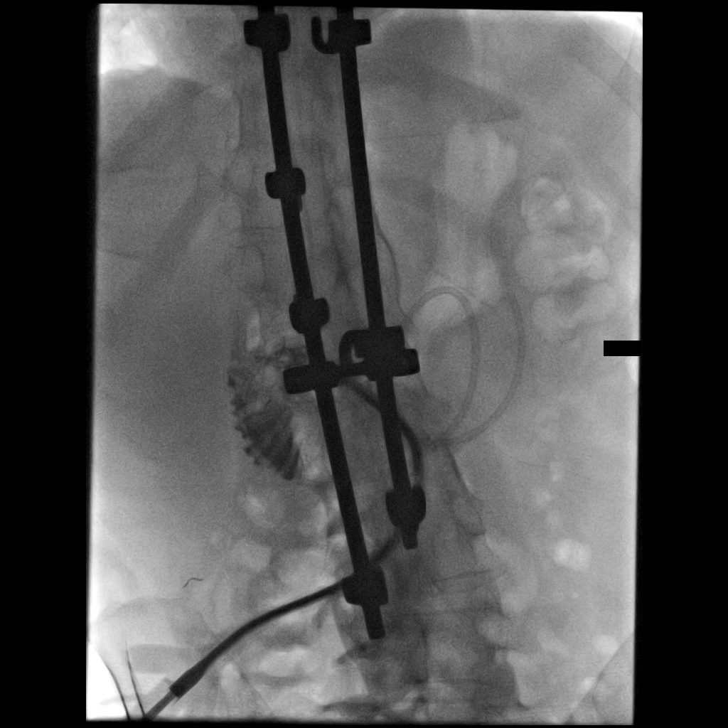
[im 6/6]
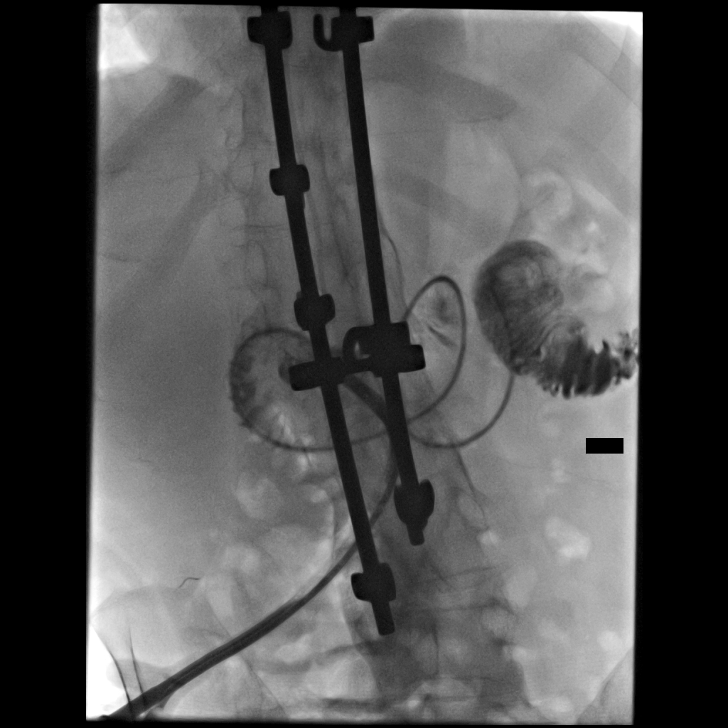

[10 of 10 positions shown; findings below may reference images not displayed]

EXAM:
INJECTION OF ENTERIC FEEDING TUBE UNDER FLUOROSCOPY

MEDICATIONS:
None

ANESTHESIA/SEDATION:
None

CONTRAST:  15 mL [id number] - administered into the stomach and
jejunum.

FLUOROSCOPY TIME:  Fluoroscopy Time: 30 seconds.  2.4 mGy.

COMPLICATIONS:
None immediate.

PROCEDURE:
Both gastric and jejunal feeding poor its of the indwelling balloon
retention gastrojejunal feeding catheter were injected with contrast
material. Fluoroscopic spot images were saved. The retention balloon
was deflated as well and the gastric portion moved back and forth
slightly. The retention balloon was then re-inflated with 7 mL of
saline.
FINDINGS: The gastrojejunal feeding tube is in stable and appropriate position
with the gastric portion terminating in the lumen of the stomach and
the jejunal portion located well within the proximal jejunum. Both
lumens are widely patent. No evidence of leakage of contrast
material into the peritoneal cavity.
IMPRESSION: Unremarkable contrast injection study of the indwelling balloon
retention gastrojejunal feeding catheter. The catheter is
appropriately positioned and normally patent.

## 2018-01-06 ENCOUNTER — Other Ambulatory Visit: Payer: Self-pay | Admitting: Physical Medicine & Rehabilitation

## 2018-01-06 DIAGNOSIS — G809 Cerebral palsy, unspecified: Secondary | ICD-10-CM

## 2018-01-06 DIAGNOSIS — G8250 Quadriplegia, unspecified: Secondary | ICD-10-CM

## 2018-01-18 ENCOUNTER — Encounter (HOSPITAL_COMMUNITY): Payer: Self-pay | Admitting: Interventional Radiology

## 2018-01-18 ENCOUNTER — Ambulatory Visit (HOSPITAL_COMMUNITY)
Admission: RE | Admit: 2018-01-18 | Discharge: 2018-01-18 | Disposition: A | Discharge status: Home / Self Care | Payer: Medicare Other | Source: Ambulatory Visit | Attending: Interventional Radiology | Admitting: Interventional Radiology

## 2018-01-18 ENCOUNTER — Other Ambulatory Visit (HOSPITAL_COMMUNITY): Payer: Self-pay | Admitting: Interventional Radiology

## 2018-01-18 DIAGNOSIS — Y733 Surgical instruments, materials and gastroenterology and urology devices (including sutures) associated with adverse incidents: Secondary | ICD-10-CM | POA: Diagnosis not present

## 2018-01-18 DIAGNOSIS — R633 Feeding difficulties, unspecified: Secondary | ICD-10-CM

## 2018-01-18 DIAGNOSIS — K9413 Enterostomy malfunction: Secondary | ICD-10-CM | POA: Diagnosis not present

## 2018-01-18 DIAGNOSIS — K9423 Gastrostomy malfunction: Secondary | ICD-10-CM | POA: Diagnosis not present

## 2018-01-18 HISTORY — PX: IR GJ TUBE CHANGE: IMG1440

## 2018-01-18 MED ORDER — IOPAMIDOL (ISOVUE-300) INJECTION 61%
INTRAVENOUS | Status: AC
  Administered 2018-01-18: 40 mL
  Filled 2018-01-18: qty 50

## 2018-01-18 MED ORDER — IOPAMIDOL (ISOVUE-300) INJECTION 61%
50.0000 mL | Freq: Once | INTRAVENOUS | Status: AC | PRN
  Administered 2018-01-18: 40 mL

## 2018-01-19 DIAGNOSIS — H1031 Unspecified acute conjunctivitis, right eye: Secondary | ICD-10-CM | POA: Diagnosis not present

## 2018-01-21 ENCOUNTER — Ambulatory Visit (HOSPITAL_BASED_OUTPATIENT_CLINIC_OR_DEPARTMENT_OTHER)
Admission: RE | Admit: 2018-01-21 | Discharge: 2018-01-21 | Disposition: A | Discharge status: Home / Self Care | Payer: Medicare Other | Source: Ambulatory Visit | Attending: Medical | Admitting: Medical

## 2018-01-21 ENCOUNTER — Encounter: Payer: Self-pay | Admitting: Medical

## 2018-01-21 ENCOUNTER — Ambulatory Visit (INDEPENDENT_AMBULATORY_CARE_PROVIDER_SITE_OTHER): Payer: Medicare Other | Admitting: Medical

## 2018-01-21 ENCOUNTER — Telehealth: Payer: Self-pay | Admitting: Medical

## 2018-01-21 VITALS — BP 105/68 | HR 93 | Resp 18

## 2018-01-21 DIAGNOSIS — J90 Pleural effusion, not elsewhere classified: Secondary | ICD-10-CM

## 2018-01-21 DIAGNOSIS — B49 Unspecified mycosis: Secondary | ICD-10-CM | POA: Diagnosis not present

## 2018-01-21 DIAGNOSIS — R059 Cough, unspecified: Secondary | ICD-10-CM

## 2018-01-21 DIAGNOSIS — R109 Unspecified abdominal pain: Secondary | ICD-10-CM | POA: Diagnosis not present

## 2018-01-21 DIAGNOSIS — R05 Cough: Secondary | ICD-10-CM

## 2018-01-21 DIAGNOSIS — R110 Nausea: Secondary | ICD-10-CM

## 2018-01-21 DIAGNOSIS — J984 Other disorders of lung: Secondary | ICD-10-CM

## 2018-01-21 DIAGNOSIS — R079 Chest pain, unspecified: Secondary | ICD-10-CM | POA: Diagnosis not present

## 2018-01-21 MED ORDER — AZITHROMYCIN 250 MG PO TABS: ORAL_TABLET | ORAL | 0 refills | Status: DC

## 2018-01-21 MED ORDER — ONDANSETRON 8 MG PO TBDP: 8.0000 mg | ORAL_TABLET | Freq: Three times a day (TID) | ORAL | 0 refills | Status: DC | PRN

## 2018-01-21 MED ORDER — FLUCONAZOLE 150 MG PO TABS: ORAL_TABLET | ORAL | 0 refills | Status: DC

## 2018-01-21 NOTE — Progress Notes (Signed)
Subjective:    Patient ID: [name], male    DOB: April 08, 1986, 32 y.o.   MRN: [id number]  HPI  Pt mother in for concerns about feeding/skin area. Family got tube changed on monday  Before they removed tube there was some hard precipate in the tube. So they did change feeding tube. The tube looks good now.Some mild stomach tenderness recently. He did vomit one time yesterday and this morning. Mom used  carafate recenlty but did not help much. Seems little lethargic per mom reently  but no fever. Watching more tv little less interactive.   This did happen 2 years ago with same presentation. Pt GI MD did give him fluconazole and he did get better last time.  Some cough and clearing of his throat recently. No worse than his baseline.     Review of Systems  Constitutional: Positive for fatigue. Negative for chills and fever.  HENT: Negative for congestion, ear pain, postnasal drip, sinus pressure, sinus pain and sore throat.   Respiratory: Positive for cough. Negative for apnea, chest tightness and shortness of breath.   Cardiovascular: Negative for chest pain and palpitations.  Gastrointestinal: Positive for abdominal pain and nausea. Negative for abdominal distention, constipation, diarrhea and vomiting.  Musculoskeletal: Negative for arthralgias, back pain, neck pain and neck stiffness.  Skin: Negative for rash.  Neurological: Negative for dizziness, seizures, weakness and light-headedness.  Hematological: Negative for adenopathy. Does not bruise/bleed easily.  Psychiatric/Behavioral: Negative for behavioral problems and confusion.    Past Medical History:  Diagnosis Date  . Cerebral palsy ([place])   . Dehydration 11/22/2013  . Depression with anxiety 08/01/2010   Qualifier: Diagnosis of  By: Nelson-Smith CMA (AAMA), Dottie    . Dyslipidemia 08/19/2017  . Esophagitis 2011  . 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 GERD (gastroesophageal reflux disease)   . Hyperlipidemia,  mild 08/25/2015  . Hyperthyroidism   . Incontinence of feces   . Loss of weight 08/28/2014  . Medicare annual wellness visit, subsequent 08/25/2015  . Mildly underweight adult 03/16/2017  . Palpitations   . Skin lesion of right ear 03/16/2017  . Thyroid disease 08/01/2010   Qualifier: Diagnosis of  By: Harlon Ditty CMA (AAMA), Dottie       Social History   Socioeconomic History  . Marital status: Single    Spouse name: Not on file  . Number of children: 0  . Years of education: Not on file  . Highest education level: Not on file  Occupational History  . Occupation: disbaled  Social Needs  . Financial resource strain: Not on file  . Food insecurity:    Worry: Not on file    Inability: Not on file  . Transportation needs:    Medical: Not on file    Non-medical: Not on file  Tobacco Use  . Smoking status: Never Smoker  . Smokeless tobacco: Never Used  Substance and Sexual Activity  . Alcohol use: No  . Drug use: No  . Sexual activity: Never  Lifestyle  . Physical activity:    Days per week: Not on file    Minutes per session: Not on file  . Stress: Not on file  Relationships  . Social connections:    Talks on phone: Not on file    Gets together: Not on file    Attends religious service: Not on file    Active member of club or organization: Not on file    Attends meetings of clubs or organizations:  Not on file    Relationship status: Not on file  . Intimate partner violence:    Fear of current or ex partner: Not on file    Emotionally abused: Not on file    Physically abused: Not on file    Forced sexual activity: Not on file  Other Topics Concern  . Not on file  Social History Narrative  . Not on file    Past Surgical History:  Procedure Laterality Date  . baclofen trial    . baslofen pump implant    . ears tubes    . EYE SURGERY    . FLEXIBLE SIGMOIDOSCOPY N/A 09/07/2014   Procedure: FLEXIBLE SIGMOIDOSCOPY;  Surgeon: [name], MD;  Location: [facility]  ENDOSCOPY;  Service: Endoscopy;  Laterality: N/A;  . g-tube insert  August 2006  . hamstring released     to treat contractures.   Marland Kitchen HIP SURGERY     x2 , side   . IR CM INJ ANY COLONIC TUBE W/FLUORO  05/28/2017  . IR GENERIC HISTORICAL  07/01/2016   IR GASTR TUBE CONVERT GASTR-JEJ PER W/FL MOD SED 07/01/2016 [name], MD [facility]  . IR GENERIC HISTORICAL  07/08/2016   IR PATIENT EVAL TECH 0-60 MINS 07/08/2016 [name], MD [facility]  . IR GENERIC HISTORICAL  07/14/2016   IR GJ TUBE CHANGE 07/14/2016 [name], MD [facility]  . IR GENERIC HISTORICAL  07/21/2016   IR PATIENT EVAL TECH 0-60 MINS [facility]  . IR GENERIC HISTORICAL  08/31/2016   IR Gaastra DUODEN/JEJUNO TUBE PERCUT W/FLUORO 08/31/2016 [name], MD [facility]  . IR GENERIC HISTORICAL  09/03/2016   IR GJ TUBE CHANGE 09/03/2016 [name], MD [facility]  . IR GENERIC HISTORICAL  09/10/2016   IR GASTR TUBE CONVERT GASTR-JEJ PER W/FL MOD SED 09/10/2016 [facility]  . IR GENERIC HISTORICAL  09/16/2016   IR PATIENT EVAL TECH 0-60 MINS [facility]  . IR GENERIC HISTORICAL  09/29/2016   IR GJ TUBE CHANGE 09/29/2016 [name], MD [facility]  . IR GJ TUBE CHANGE  02/05/2017  . IR GJ TUBE CHANGE  05/21/2017  . IR GJ TUBE CHANGE  08/25/2017  . IR GJ TUBE CHANGE  01/18/2018  . PEG PLACEMENT  10/21/2011   Procedure: PERCUTANEOUS ENDOSCOPIC GASTROSTOMY (PEG) REPLACEMENT;  Surgeon: [name], MD;  Location: [facility] ENDOSCOPY;  Service: Endoscopy;  Laterality: N/A;  . PEG PLACEMENT N/A 06/13/2013   Procedure: PERCUTANEOUS ENDOSCOPIC GASTROSTOMY (PEG) REPLACEMENT;  Surgeon: [name], MD;  Location: [facility] ENDOSCOPY;  Service: Endoscopy;  Laterality: N/A;  . SPINAL FUSION    . spinal fusion to correct 70 degree kyphosis  11-2010  . spinal fusioncorrect 106 degree kyphosis    . SPINE SURGERY  ,11/20/2010, 2011   for correction of severe contracturing spinal kyphosis.   . TONSILLECTOMY      Family History    Problem Relation Age of Onset  . Asthma Mother   . Hyperlipidemia Mother   . COPD Mother   . Other Mother        bronchial stasis/ABPA  . Cancer Maternal Grandmother 36       breast  . Hyperlipidemia Maternal Grandmother   . Hypertension Maternal Grandmother   . Cancer Maternal Grandfather        prostate  . Heart disease Paternal Grandfather        CHF  . Osteoporosis Paternal Grandmother   . Arthritis Paternal Grandmother  rheumatoid    Allergies  Allergen Reactions  . Ambien [Zolpidem Tartrate] Nausea Only  . Antihistamines, Chlorpheniramine-Type     Other reaction(s): Other (See Comments) Other Reaction: agitation  . Codeine Other (See Comments)    Makes patient too active after a few days.  . Metoclopramide Other (See Comments)    Delusion, emotionality   . Baclofen Anxiety  . Pheniramine Rash    Other reaction(s): Other (See Comments) Other Reaction: agitation  . Sulfa Antibiotics Rash  . Sulfonamide Derivatives Rash    Current Outpatient Medications on File Prior to Visit  Medication Sig Dispense Refill  . AMBULATORY NON FORMULARY MEDICATION Medication Name: MIC gastrostomy/bolus feeding tube 24 French Part number 0110-24. #2 and 10 cc lurer lock syringe #2 Dx: 4 Device 2  . bacitracin 500 UNIT/GM ointment Apply 1 application topically 2 (two) times daily. 30 g 1  . clotrimazole-betamethasone (LOTRISONE) cream Apply 1 application topically 2 (two) times daily. 45 g 1  . dantrolene (DANTRIUM) 50 MG capsule TAKE 1 CAPSULE BY MOUTH 3 TIMES A DAY. 90 capsule 4  . diphenoxylate-atropine (LOMOTIL) 2.5-0.025 MG tablet Take 1-2 tablets via tub three times daily as needed for loose stool 90 tablet 2  . divalproex (DEPAKOTE SPRINKLE) 125 MG capsule Take 2 capsules in morning, 5 capsules at bedtime (Patient taking differently: 250-625 mg. Take 2 capsules in morning, 5 capsules at bedtime) 210 capsule 4  . Incontinence Supply Disposable (PREVAIL BREEZERS MEDIUM)  MISC pkg of 16- size medium 32" to 44"  Breathable cloth-like outer fabric (can't use the plastic outer surgace  Item # [id number] 16 each 6  . LORazepam (ATIVAN) 1 MG tablet Take one tablet in evenings as needed for insomnia. (Patient taking differently: Take one tablet in evenings for insomnia.) 30 tablet 1  . mupirocin ointment (BACTROBAN) 2 % Apply to area thin film twice daily if needed 22 g 0  . NON FORMULARY Bard Leg Bag Extension tubing w/Connector 18", Sterile, latex-free  Item# [id number]    . NON FORMULARY Colorplast Freedom Cath Latex Self-Adhering Male External Catheter 31mm Diameter Intermediate  Item# [id number]    . NONFORMULARY OR COMPOUNDED ITEM [employer] REF [id number] - Kangaroo Joey Pump Set with Flush Bags - 1000 mL 1 each 0  . Nutritional Supplements (FEEDING SUPPLEMENT, JEVITY 1.5 CAL,) LIQD Run tube feed at 80 mL per hour over 15 hours. (3 cartons) 80 mL 11  . Nutritional Supplements (FEEDING SUPPLEMENT, VITAL 1.5 CAL,) LIQD Place 80 mLs into feeding tube continuous. 2 cartons 80 mL 11  . nystatin cream (MYCOSTATIN) Apply 1 application topically 2 (two) times daily as needed for dry skin. 30 g 1  . OLANZapine (ZYPREXA) 2.5 MG tablet Place 5 mg into feeding tube 2 (two) times daily.   3  . Omeprazole-Sodium Bicarbonate (ZEGERID) 20-1100 MG CAPS capsule Take 1 capsule by mouth daily before breakfast.    . Ostomy Supplies (PROTECTIVE BARRIER WIPES) MISC 1-1/4" X 3"  Item #[id number] 75 each 6  . PARoxetine (PAXIL) 10 MG tablet Take 15 mg by mouth daily.    . sucralfate (CARAFATE) 1 g tablet Take 1 tablet (1 g total) by mouth 4 (four) times daily -  with meals and at bedtime. 120 tablet 0   No current facility-administered medications on file prior to visit.     BP 105/68 (BP Location: Left Arm, Patient Position: Sitting, Cuff Size: Normal)   Pulse 93   Resp 18   SpO2 95%  Objective:   Physical Exam  General Mental Status- Alert. General Appearance- Not in acute  distress Pleasant. Smiles. Seems his usual pleasant self.     Skin General: Color- Normal Color. Moisture- Normal Moisture.  Neck Carotid Arteries- Normal color. Moisture- Normal Moisture. No carotid bruits. No JVD.  Chest and Lung Exam Auscultation: Breath Sounds:-Normal.faint rough breath sounds upper lobes.  Cardiovascular Auscultation:Rythm- Regular. Murmurs & Other Heart Sounds:Auscultation of the heart reveals- No Murmurs.  Abdomen Inspection:-Inspeection Normal. Palpation/Percussion:Note:No mass. Palpation and Percussion of the abdomen reveal- Non Tender, Non Distended + BS, no rebound or guarding. Around tube no redness or warmth. Tube feels patent. No build up.   Neurologic Cranial Nerve exam:- CN III-XII intact(No nystagmus), symmetric smile. Drift Test:- No drift. Romberg Exam:- Negative.  Heal to Toe Gait exam:-Normal. Finger to Nose:- Normal/Intact Strength:- 5/5 equal and symmetric strength both upper and lower extremities.      Assessment & Plan:  [phone]. For the concern of potential fungal infection(very similar presentation as 2 years ago), I am prescribing fluconazole tablets to use 1 tab daily for 8 days.  Also as we approach weekend I would like to get CBC, CMP and pancreas enzymes.  In addition with recent cough would like to get chest x-ray to make sure no pneumonia present.  Will follow labs and chest x-ray and notify you of results when those are in.  If labs and x-rays relatively normal will send message by my chart.  If any significant findings would likely call as well.  Follow-up in 5-7 days or as needed.  After hours decided to send zofran to pt pharmacy.    Mackie Pai, PA-C

## 2018-01-21 NOTE — Telephone Encounter (Signed)
See referal to pulmonologist.

## 2018-01-21 NOTE — Telephone Encounter (Signed)
Pt temperature was not documented in computer. Will you document that or let me know so I can addendum. If you can't remember please let me know. Maybe it is in shred basket.

## 2018-01-21 NOTE — Telephone Encounter (Signed)
Referral to pulmonologist placed. 

## 2018-01-21 NOTE — Telephone Encounter (Signed)
Rx of azithromycin sent to pt pharmacy.

## 2018-01-21 NOTE — Patient Instructions (Addendum)
For the concern of potential fungal infection(very similar presentation as 2 years ago), I am prescribing fluconazole tablets to use 1 tab daily for 8 days.  Also as we approach weekend I would like to get CBC, CMP and pancreas enzymes.  In addition with recent cough would like to get chest x-ray to make sure no pneumonia present.  Will follow labs and chest x-ray and notify you of results when those are in.  If labs and x-rays relatively normal will send message by my chart.  If any significant findings would likely call as well.  Follow-up in 5-7 days or as needed.

## 2018-01-22 LAB — AMYLASE: Amylase: 25 U/L (ref 21–101)

## 2018-01-22 LAB — COMPREHENSIVE METABOLIC PANEL
AG Ratio: 1.5 (calc) (ref 1.0–2.5)
ALT: 21 U/L (ref 9–46)
AST: 20 U/L (ref 10–40)
Albumin: 4.4 g/dL (ref 3.6–5.1)
Alkaline phosphatase (APISO): 95 U/L (ref 40–115)
BUN/Creatinine Ratio: 26 (calc) — ABNORMAL HIGH (ref 6–22)
BUN: 9 mg/dL (ref 7–25)
CO2: 27 mmol/L (ref 20–32)
Calcium: 9.3 mg/dL (ref 8.6–10.3)
Chloride: 101 mmol/L (ref 98–110)
Creat: 0.34 mg/dL — ABNORMAL LOW (ref 0.60–1.35)
Globulin: 3 g/dL (calc) (ref 1.9–3.7)
Glucose, Bld: 95 mg/dL (ref 65–99)
Potassium: 4.2 mmol/L (ref 3.5–5.3)
Sodium: 136 mmol/L (ref 135–146)
Total Bilirubin: 0.3 mg/dL (ref 0.2–1.2)
Total Protein: 7.4 g/dL (ref 6.1–8.1)

## 2018-01-22 LAB — CBC WITH DIFFERENTIAL/PLATELET
Basophils Absolute: 21 cells/uL (ref 0–200)
Basophils Relative: 0.4 %
Eosinophils Absolute: 80 cells/uL (ref 15–500)
Eosinophils Relative: 1.5 %
HCT: 44.8 % (ref 38.5–50.0)
Hemoglobin: 15.6 g/dL (ref 13.2–17.1)
Lymphs Abs: 1913 cells/uL (ref 850–3900)
MCH: 31.2 pg (ref 27.0–33.0)
MCHC: 34.8 g/dL (ref 32.0–36.0)
MCV: 89.6 fL (ref 80.0–100.0)
MPV: 11.1 fL (ref 7.5–12.5)
Monocytes Relative: 7.3 %
Neutro Abs: 2899 cells/uL (ref 1500–7800)
Neutrophils Relative %: 54.7 %
Platelets: 210 10*3/uL (ref 140–400)
RBC: 5 10*6/uL (ref 4.20–5.80)
RDW: 12.1 % (ref 11.0–15.0)
Total Lymphocyte: 36.1 %
WBC mixed population: 387 cells/uL (ref 200–950)
WBC: 5.3 10*3/uL (ref 3.8–10.8)

## 2018-01-22 LAB — LIPASE: Lipase: 20 U/L (ref 7–60)

## 2018-01-24 ENCOUNTER — Telehealth: Payer: Self-pay | Admitting: *Deleted

## 2018-01-24 NOTE — Telephone Encounter (Signed)
Received request for current Medication Records signed by provider, and also any specific protocols that staff should follow while providing dservices in the event of a seizure from [facility]; forwarded to provider with medication list and seizure protocol instructions/SLS 03/25

## 2018-01-24 NOTE — Telephone Encounter (Signed)
Received Physician Orders from [facility]; forwarded to provider/SLS 03/25

## 2018-01-25 NOTE — Telephone Encounter (Signed)
[name] w/[facility] called to f/u on status of orders for tube feeding that was faxed 01/21/18  Call back if needed [phone]

## 2018-01-26 NOTE — Telephone Encounter (Signed)
Sent Edward a skype message explaining I did not check patient's temp at visit while he was here. I apologize for the inconvenience.

## 2018-01-26 NOTE — Telephone Encounter (Signed)
Paperwork faxed 01/25/18//SLS

## 2018-01-28 ENCOUNTER — Telehealth: Payer: Self-pay | Admitting: *Deleted

## 2018-01-28 NOTE — Telephone Encounter (Signed)
Received Physician Orders [continued order] Gastrostomy/Jejunostomy Tube Feeding from [facility]; forwarded to provider/SLS 03/29

## 2018-01-28 NOTE — Telephone Encounter (Signed)
Received Physician Orders/DME from [facility]; forwarded to provider/SLS 03/29

## 2018-02-01 DIAGNOSIS — H10021 Other mucopurulent conjunctivitis, right eye: Secondary | ICD-10-CM | POA: Diagnosis not present

## 2018-02-02 ENCOUNTER — Other Ambulatory Visit: Payer: Self-pay | Admitting: Radiology

## 2018-02-02 ENCOUNTER — Other Ambulatory Visit (HOSPITAL_COMMUNITY): Payer: Self-pay | Admitting: Radiology

## 2018-02-02 ENCOUNTER — Telehealth: Payer: Self-pay

## 2018-02-02 DIAGNOSIS — R633 Feeding difficulties, unspecified: Secondary | ICD-10-CM

## 2018-02-02 NOTE — Telephone Encounter (Signed)
Just grab me when she calls. I would be fine with patient taking a course of Doxycycline.

## 2018-02-02 NOTE — Telephone Encounter (Signed)
Dr. Milana Huntsman De'Allen OD from [facility] in [place] called, call aback # is [phone]. She wanted to discuss oral antibiotic (Doxycycline) for chronic eye infection on this patient. She has used a few topical antibiotics but infection does not go away completely. She will like to discuss with Dr. Charlett Blake.  Dr. Annice Pih said she will call back tomorrow 02-03-18 to discuss with Dr. Charlett Blake.

## 2018-02-03 ENCOUNTER — Other Ambulatory Visit (HOSPITAL_COMMUNITY): Payer: Self-pay

## 2018-02-04 ENCOUNTER — Encounter (HOSPITAL_COMMUNITY): Payer: Self-pay | Admitting: Interventional Radiology

## 2018-02-04 ENCOUNTER — Ambulatory Visit (HOSPITAL_COMMUNITY)
Admission: RE | Admit: 2018-02-04 | Discharge: 2018-02-04 | Disposition: A | Discharge status: Home / Self Care | Payer: Medicare Other | Source: Ambulatory Visit | Attending: Radiology | Admitting: Radiology

## 2018-02-04 DIAGNOSIS — Z431 Encounter for attention to gastrostomy: Secondary | ICD-10-CM | POA: Diagnosis not present

## 2018-02-04 DIAGNOSIS — Z434 Encounter for attention to other artificial openings of digestive tract: Secondary | ICD-10-CM | POA: Diagnosis not present

## 2018-02-04 DIAGNOSIS — R633 Feeding difficulties, unspecified: Secondary | ICD-10-CM

## 2018-02-04 HISTORY — PX: IR GJ TUBE CHANGE: IMG1440

## 2018-02-04 MED ORDER — IOPAMIDOL (ISOVUE-300) INJECTION 61%
INTRAVENOUS | Status: AC
  Administered 2018-02-04: 10 mL
  Filled 2018-02-04: qty 50

## 2018-02-04 MED ORDER — IOPAMIDOL (ISOVUE-300) INJECTION 61%
25.0000 mL | Freq: Once | INTRAVENOUS | Status: AC | PRN
  Administered 2018-02-04: 10 mL

## 2018-02-04 NOTE — Telephone Encounter (Signed)
I spoke to Dr. Annice Pih again yesterday morning and advised her it was ok with Dr. Charlett Blake for patient to take oral Doxycycline. She will send rx for patient and notify mother.

## 2018-02-04 NOTE — Procedures (Signed)
GJ exchange Tip jejunum EBL 0 Comp 0

## 2018-02-14 ENCOUNTER — Encounter: Payer: Self-pay | Admitting: Family Medicine

## 2018-02-14 ENCOUNTER — Ambulatory Visit (INDEPENDENT_AMBULATORY_CARE_PROVIDER_SITE_OTHER): Payer: Medicare Other | Admitting: Family Medicine

## 2018-02-14 ENCOUNTER — Ambulatory Visit: Payer: Medicare Other | Admitting: Family Medicine

## 2018-02-14 DIAGNOSIS — H44001 Unspecified purulent endophthalmitis, right eye: Secondary | ICD-10-CM

## 2018-02-14 DIAGNOSIS — R1319 Other dysphagia: Secondary | ICD-10-CM

## 2018-02-14 DIAGNOSIS — E785 Hyperlipidemia, unspecified: Secondary | ICD-10-CM | POA: Diagnosis not present

## 2018-02-14 DIAGNOSIS — G8250 Quadriplegia, unspecified: Secondary | ICD-10-CM | POA: Diagnosis not present

## 2018-02-14 DIAGNOSIS — R1310 Dysphagia, unspecified: Secondary | ICD-10-CM | POA: Diagnosis not present

## 2018-02-14 NOTE — Progress Notes (Signed)
Subjective:  I acted as a Education administrator for Dr. Charlett Blake. [name], [place]  Patient ID: [name], male    DOB: [birth year], 32 y.o.   MRN: [id number]  No chief complaint on file.   HPI  Patient is in today for a 6 month follow up and is accompanied by his mother and an aide. They report he is doing well. No recent febrile illness or hospitalizations. He is tolerating his PEG tube feedings. He is having trouble with a persistent infection of right eyelid despite topical treatments. Denies CP/palp/SOB/HA/congestion/fevers/GI or GU c/o. Taking meds as prescribed  Patient Care Team: [name], MD as PCP - General (Family Medicine)   Past Medical History:  Diagnosis Date  . Cerebral palsy ([place])   . Dehydration 11/22/2013  . Depression with anxiety 08/01/2010   Qualifier: Diagnosis of  By: Nelson-Smith CMA (AAMA), Dottie    . Dyslipidemia 08/19/2017  . Esophagitis 2011  . Gastrostomy in place [facility]) 08/31/2013  . GERD (gastroesophageal reflux disease)   . Hyperlipidemia, mild 08/25/2015  . Hyperthyroidism   . Incontinence of feces   . Loss of weight 08/28/2014  . Medicare annual wellness visit, subsequent 08/25/2015  . Mildly underweight adult 03/16/2017  . Palpitations   . Skin lesion of right ear 03/16/2017  . Thyroid disease 08/01/2010   Qualifier: Diagnosis of  By: Harlon Ditty CMA (AAMA), Dottie      Past Surgical History:  Procedure Laterality Date  . baclofen trial    . baslofen pump implant    . ears tubes    . EYE SURGERY    . FLEXIBLE SIGMOIDOSCOPY N/A 09/07/2014   Procedure: FLEXIBLE SIGMOIDOSCOPY;  Surgeon: [name], MD;  Location: [facility] ENDOSCOPY;  Service: Endoscopy;  Laterality: N/A;  . g-tube insert  August 2006  . hamstring released     to treat contractures.   Marland Kitchen HIP SURGERY     x2 , side   . IR CM INJ ANY COLONIC TUBE W/FLUORO  05/28/2017  . IR GENERIC HISTORICAL  07/01/2016   IR GASTR TUBE CONVERT GASTR-JEJ PER W/FL MOD SED 07/01/2016 [name], MD  [facility]  . IR GENERIC HISTORICAL  07/08/2016   IR PATIENT EVAL TECH 0-60 MINS 07/08/2016 [name], MD [facility]  . IR GENERIC HISTORICAL  07/14/2016   IR GJ TUBE CHANGE 07/14/2016 [name], MD [facility]  . IR GENERIC HISTORICAL  07/21/2016   IR PATIENT EVAL TECH 0-60 MINS [facility]  . IR GENERIC HISTORICAL  08/31/2016   IR [place] DUODEN/JEJUNO TUBE PERCUT W/FLUORO 08/31/2016 [name], MD [facility]  . IR GENERIC HISTORICAL  09/03/2016   IR GJ TUBE CHANGE 09/03/2016 [name], MD [facility]  . IR GENERIC HISTORICAL  09/10/2016   IR GASTR TUBE CONVERT GASTR-JEJ PER W/FL MOD SED 09/10/2016 [facility]  . IR GENERIC HISTORICAL  09/16/2016   IR PATIENT EVAL TECH 0-60 MINS [facility]  . IR GENERIC HISTORICAL  09/29/2016   IR GJ TUBE CHANGE 09/29/2016 [name], MD [facility]  . IR GJ TUBE CHANGE  02/05/2017  . IR GJ TUBE CHANGE  05/21/2017  . IR GJ TUBE CHANGE  08/25/2017  . IR GJ TUBE CHANGE  01/18/2018  . IR GJ TUBE CHANGE  02/04/2018  . PEG PLACEMENT  10/21/2011   Procedure: PERCUTANEOUS ENDOSCOPIC GASTROSTOMY (PEG) REPLACEMENT;  Surgeon: [name], MD;  Location: [facility];  Service: Endoscopy;  Laterality: N/A;  . PEG PLACEMENT N/A 06/13/2013  Procedure: PERCUTANEOUS ENDOSCOPIC GASTROSTOMY (PEG) REPLACEMENT;  Surgeon: [name], MD;  Location: [facility];  Service: Endoscopy;  Laterality: N/A;  . SPINAL FUSION    . spinal fusion to correct 70 degree kyphosis  11-2010  . spinal fusioncorrect 106 degree kyphosis    . SPINE SURGERY  ,11/20/2010, 2011   for correction of severe contracturing spinal kyphosis.   . TONSILLECTOMY      Family History  Problem Relation Age of Onset  . Asthma Mother   . Hyperlipidemia Mother   . COPD Mother   . Other Mother        bronchial stasis/ABPA  . Cancer Maternal Grandmother 9       breast  . Hyperlipidemia Maternal Grandmother   . Hypertension Maternal Grandmother   . Cancer Maternal Grandfather          prostate  . Heart disease Paternal Grandfather        CHF  . Osteoporosis Paternal Grandmother   . Arthritis Paternal Grandmother        rheumatoid    Social History   Socioeconomic History  . Marital status: Single    Spouse name: Not on file  . Number of children: 0  . Years of education: Not on file  . Highest education level: Not on file  Occupational History  . Occupation: disbaled  Social Needs  . Financial resource strain: Not on file  . Food insecurity:    Worry: Not on file    Inability: Not on file  . Transportation needs:    Medical: Not on file    Non-medical: Not on file  Tobacco Use  . Smoking status: Never Smoker  . Smokeless tobacco: Never Used  Substance and Sexual Activity  . Alcohol use: No  . Drug use: No  . Sexual activity: Never  Lifestyle  . Physical activity:    Days per week: Not on file    Minutes per session: Not on file  . Stress: Not on file  Relationships  . Social connections:    Talks on phone: Not on file    Gets together: Not on file    Attends religious service: Not on file    Active member of club or organization: Not on file    Attends meetings of clubs or organizations: Not on file    Relationship status: Not on file  . Intimate partner violence:    Fear of current or ex partner: Not on file    Emotionally abused: Not on file    Physically abused: Not on file    Forced sexual activity: Not on file  Other Topics Concern  . Not on file  Social History Narrative  . Not on file    Outpatient Medications Prior to Visit  Medication Sig Dispense Refill  . AMBULATORY NON FORMULARY MEDICATION Medication Name: MIC gastrostomy/bolus feeding tube 24 French Part number 0110-24. #2 and 10 cc lurer lock syringe #2 Dx: 4 Device 2  . bacitracin 500 UNIT/GM ointment Apply 1 application topically 2 (two) times daily. 30 g 1  . clotrimazole-betamethasone (LOTRISONE) cream Apply 1 application topically 2 (two) times daily. 45 g 1   . dantrolene (DANTRIUM) 50 MG capsule TAKE 1 CAPSULE BY MOUTH 3 TIMES A DAY. 90 capsule 4  . divalproex (DEPAKOTE SPRINKLE) 125 MG capsule Take 2 capsules in morning, 5 capsules at bedtime (Patient taking differently: 250-625 mg. Take 2 capsules in morning, 5 capsules at bedtime) 210 capsule 4  .  Incontinence Supply Disposable (PREVAIL BREEZERS MEDIUM) MISC pkg of 16- size medium 32" to 44"  Breathable cloth-like outer fabric (can't use the plastic outer surgace  Item # [id number] 16 each 6  . LORazepam (ATIVAN) 1 MG tablet Take one tablet in evenings as needed for insomnia. (Patient taking differently: Take one tablet in evenings for insomnia.) 30 tablet 1  . mupirocin ointment (BACTROBAN) 2 % Apply to area thin film twice daily if needed 22 g 0  . NON FORMULARY Bard Leg Bag Extension tubing w/Connector 18", Sterile, latex-free  Item# [id number]    . NON FORMULARY Colorplast Freedom Cath Latex Self-Adhering Male External Catheter 72mm Diameter Intermediate  Item# [id number]    . NONFORMULARY OR COMPOUNDED ITEM [employer] REF [id number] - [place] Pump Set with Flush Bags - 1000 mL 1 each 0  . Nutritional Supplements (FEEDING SUPPLEMENT, JEVITY 1.5 CAL,) LIQD Run tube feed at 80 mL per hour over 15 hours. (3 cartons) 80 mL 11  . Nutritional Supplements (FEEDING SUPPLEMENT, VITAL 1.5 CAL,) LIQD Place 80 mLs into feeding tube continuous. 2 cartons 80 mL 11  . OLANZapine (ZYPREXA) 2.5 MG tablet Place 5 mg into feeding tube 2 (two) times daily.   3  . Omeprazole-Sodium Bicarbonate (ZEGERID) 20-1100 MG CAPS capsule Take 1 capsule by mouth daily before breakfast.    . Ostomy Supplies (PROTECTIVE BARRIER WIPES) MISC 1-1/4" X 3"  Item #[id number] 75 each 6  . PARoxetine (PAXIL) 10 MG tablet Take 15 mg by mouth daily.    . sucralfate (CARAFATE) 1 g tablet Take 1 tablet (1 g total) by mouth 4 (four) times daily -  with meals and at bedtime. 120 tablet 0  . azithromycin (ZITHROMAX) 250 MG tablet Take 2 tablets  by mouth on day 1, followed by 1 tablet by mouth daily for 4 days. 6 tablet 0  . diphenoxylate-atropine (LOMOTIL) 2.5-0.025 MG tablet Take 1-2 tablets via tub three times daily as needed for loose stool 90 tablet 2  . fluconazole (DIFLUCAN) 150 MG tablet 1 tab po q day for 8 days 8 tablet 0  . nystatin cream (MYCOSTATIN) Apply 1 application topically 2 (two) times daily as needed for dry skin. 30 g 1  . ondansetron (ZOFRAN ODT) 8 MG disintegrating tablet Take 1 tablet (8 mg total) by mouth every 8 (eight) hours as needed for nausea or vomiting. 20 tablet 0   No facility-administered medications prior to visit.     Allergies  Allergen Reactions  . Ambien [Zolpidem Tartrate] Nausea Only  . Antihistamines, Chlorpheniramine-Type     Other reaction(s): Other (See Comments) Other Reaction: agitation  . Codeine Other (See Comments)    Makes patient too active after a few days.  . Metoclopramide Other (See Comments)    Delusion, emotionality   . Baclofen Anxiety  . Pheniramine Rash    Other reaction(s): Other (See Comments) Other Reaction: agitation  . Sulfa Antibiotics Rash  . Sulfonamide Derivatives Rash    Review of Systems  Constitutional: Negative for fever and malaise/fatigue.  HENT: Negative for congestion.   Eyes: Negative for blurred vision.  Respiratory: Negative for shortness of breath.   Cardiovascular: Negative for chest pain, palpitations and leg swelling.  Gastrointestinal: Negative for abdominal pain, blood in stool and nausea.  Genitourinary: Negative for dysuria and frequency.  Musculoskeletal: Negative for falls.  Skin: Positive for rash.  Neurological: Negative for dizziness, loss of consciousness and headaches.  Endo/Heme/Allergies: Negative for environmental allergies.  Psychiatric/Behavioral: Negative for depression.  The patient is not nervous/anxious.        Objective:    Physical Exam  Constitutional: He is oriented to person, place, and time. He  appears well-developed and well-nourished. No distress.  HENT:  Head: Normocephalic and atraumatic.  Nose: Nose normal.  Eyes: Right eye exhibits no discharge. Left eye exhibits no discharge.  Neck: Normal range of motion. Neck supple.  Cardiovascular: Normal rate and regular rhythm.  No murmur heard. Pulmonary/Chest: Effort normal and breath sounds normal.  Abdominal: Soft. Bowel sounds are normal. There is no tenderness.  Musculoskeletal: He exhibits no edema.  Neurological: He is alert and oriented to person, place, and time. He displays abnormal reflex. He exhibits abnormal muscle tone. Coordination abnormal.  In wheelchair  Skin: Skin is warm and dry.  Psychiatric: He has a normal mood and affect.  Nursing note and vitals reviewed.   BP 132/90 (BP Location: Left Arm, Patient Position: Sitting, Cuff Size: Normal)   Pulse 90   Resp 18  Wt Readings from Last 3 Encounters:  08/19/17 101 lb (45.8 kg)  03/16/17 95 lb (43.1 kg)  11/26/16 96 lb (43.5 kg)   BP Readings from Last 3 Encounters:  02/14/18 132/90  01/21/18 105/68  11/22/17 133/67     Immunization History  Administered Date(s) Administered  . Hep A / Hep B 04/07/2011, 05/14/2011  . Influenza Split 09/01/2011  . Influenza Whole 10/11/2007, 08/22/2008, 08/15/2009, 08/05/2010, 08/11/2012  . Influenza,inj,Quad PF,6+ Mos 08/06/2013, 07/16/2014, 08/15/2015, 08/27/2016  . Influenza-Unspecified 08/26/2017  . PPD Test 04/07/2011  . Pneumococcal Conjugate-13 08/27/2016  . Pneumococcal Polysaccharide-23 08/15/2009  . Td 06/14/2002, 06/19/2009    Health Maintenance  Topic Date Due  . HIV Screening  03/16/1999  . INFLUENZA VACCINE  06/02/2018  . TETANUS/TDAP  06/20/2019    Lab Results  Component Value Date   WBC 5.3 01/21/2018   HGB 15.6 01/21/2018   HCT 44.8 01/21/2018   PLT 210 01/21/2018   GLUCOSE 95 01/21/2018   CHOL 118 08/19/2017   TRIG 65.0 08/19/2017   HDL 35.00 (L) 08/19/2017   LDLCALC 70 08/19/2017    ALT 21 01/21/2018   AST 20 01/21/2018   NA 136 01/21/2018   K 4.2 01/21/2018   CL 101 01/21/2018   CREATININE 0.34 (L) 01/21/2018   BUN 9 01/21/2018   CO2 27 01/21/2018   TSH 1.12 08/19/2017   HGBA1C 5.0 04/29/2007    Lab Results  Component Value Date   TSH 1.12 08/19/2017   Lab Results  Component Value Date   WBC 5.3 01/21/2018   HGB 15.6 01/21/2018   HCT 44.8 01/21/2018   MCV 89.6 01/21/2018   PLT 210 01/21/2018   Lab Results  Component Value Date   NA 136 01/21/2018   K 4.2 01/21/2018   CO2 27 01/21/2018   GLUCOSE 95 01/21/2018   BUN 9 01/21/2018   CREATININE 0.34 (L) 01/21/2018   BILITOT 0.3 01/21/2018   ALKPHOS 76 08/19/2017   AST 20 01/21/2018   ALT 21 01/21/2018   PROT 7.4 01/21/2018   ALBUMIN 4.1 08/19/2017   CALCIUM 9.3 01/21/2018   ANIONGAP 11 09/18/2014   GFR 316.80 08/19/2017   Lab Results  Component Value Date   CHOL 118 08/19/2017   Lab Results  Component Value Date   HDL 35.00 (L) 08/19/2017   Lab Results  Component Value Date   LDLCALC 70 08/19/2017   Lab Results  Component Value Date   TRIG 65.0 08/19/2017   Lab  Results  Component Value Date   CHOLHDL 3 08/19/2017   Lab Results  Component Value Date   HGBA1C 5.0 04/29/2007         Assessment & Plan:   Problem List Items Addressed This Visit    Esophageal dysphagia (Chronic)    Patient completely NPO and using J tube, mother manages feeds well at home. Weight stable, no changes      Spastic tetraplegia (HCC)    Completed forms for patient's need for ongoing care today. Doing well at home with his parents.       Hyperlipidemia, mild    Mild, will monitor infrequently. No changes      Eye infection, right    Is following with opthamology did not respond well to topical treatments is now willing to try oral antibiotics. Encouraged to clean lids with wipes of [place].          I have discontinued Lajuana Matte "[name]"'s nystatin cream,  diphenoxylate-atropine, fluconazole, ondansetron, and azithromycin. I am also having him maintain his divalproex, clotrimazole-betamethasone, Omeprazole-Sodium Bicarbonate, NON FORMULARY, NON FORMULARY, Protective Barrier Wipes, PREVAIL BREEZERS MEDIUM, bacitracin, NONFORMULARY OR COMPOUNDED ITEM, LORazepam, PARoxetine, AMBULATORY NON FORMULARY MEDICATION, OLANZapine, mupirocin ointment, feeding supplement (JEVITY 1.5 CAL), feeding supplement (VITAL 1.5 CAL), sucralfate, and dantrolene.  No orders of the defined types were placed in this encounter.   CMA served as Education administrator during this visit. History, Physical and Plan performed by medical provider. Documentation and orders reviewed and attested to.  [name], MD

## 2018-02-17 ENCOUNTER — Ambulatory Visit: Payer: Self-pay | Admitting: Adult Health

## 2018-02-21 DIAGNOSIS — H44001 Unspecified purulent endophthalmitis, right eye: Secondary | ICD-10-CM | POA: Insufficient documentation

## 2018-02-21 NOTE — Assessment & Plan Note (Signed)
Mild, will monitor infrequently. No changes

## 2018-02-21 NOTE — Assessment & Plan Note (Signed)
Patient completely NPO and using J tube, mother manages feeds well at home. Weight stable, no changes

## 2018-02-21 NOTE — Assessment & Plan Note (Signed)
Completed forms for patient's need for ongoing care today. Doing well at home with his parents.

## 2018-02-21 NOTE — Assessment & Plan Note (Signed)
Is following with opthamology did not respond well to topical treatments is now willing to try oral antibiotics. Encouraged to clean lids with wipes of [place].

## 2018-02-23 ENCOUNTER — Other Ambulatory Visit: Payer: Self-pay

## 2018-02-23 ENCOUNTER — Encounter: Payer: Medicare Other | Attending: Physical Medicine & Rehabilitation | Admitting: Physical Medicine & Rehabilitation

## 2018-02-23 ENCOUNTER — Encounter: Payer: Self-pay | Admitting: Physical Medicine & Rehabilitation

## 2018-02-23 VITALS — BP 125/87 | HR 99

## 2018-02-23 DIAGNOSIS — G8250 Quadriplegia, unspecified: Secondary | ICD-10-CM | POA: Insufficient documentation

## 2018-02-23 DIAGNOSIS — G801 Spastic diplegic cerebral palsy: Secondary | ICD-10-CM

## 2018-02-23 NOTE — Patient Instructions (Signed)
PLEASE FEEL FREE TO CALL OUR OFFICE WITH ANY PROBLEMS OR QUESTIONS ([phone])      

## 2018-02-23 NOTE — Progress Notes (Signed)
Botox Injection for spasticity using needle EMG guidance Indication: Spastic tetraplegia (HCC)   Dilution: 100 Units/ml        Total Units Injected: 600 Indication: Severe spasticity which interferes with ADL,mobility and/or  hygiene and is unresponsive to medication management and other conservative care Informed consent was obtained after describing risks and benefits of the procedure with the patient. This includes bleeding, bruising, infection, excessive weakness, or medication side effects. A REMS form is on file and signed.  Needle: 29mm injectable monopolar needle electrode  Number of units per muscle Left biceps 200 units in 4 separate access points FCR 0 units FCU 0 units FDS 0 units FDP 0 units FPL 0 units Pronator Teres 0 units Pronator Quadratus 0 units Quadriceps 200 units each quadriceps muscle Left and Right, 4 access points each leg Gastroc/soleus 0 units Hamstrings 0 units Tibialis Posterior 0 units EHL 0 units All injections were done after obtaining appropriate EMG activity and after negative drawback for blood. The patient tolerated the procedure well. Post procedure instructions were given. Return in about 3 months (around 05/25/2018).

## 2018-03-31 IMAGING — XA IR REPLACE G/J TUBE W/ FLUORO
5 series · 13 of 16 positions shown · non-contrast
Comparison: none

INDICATION: Indwelling 24 French gastrojejunal feeding tube with difficulty
utilizing the to for medication administration and feeding.

[Series 2: fl - angio · 3 of 60 frames shown (1 of 2)]
[frame 9/60]
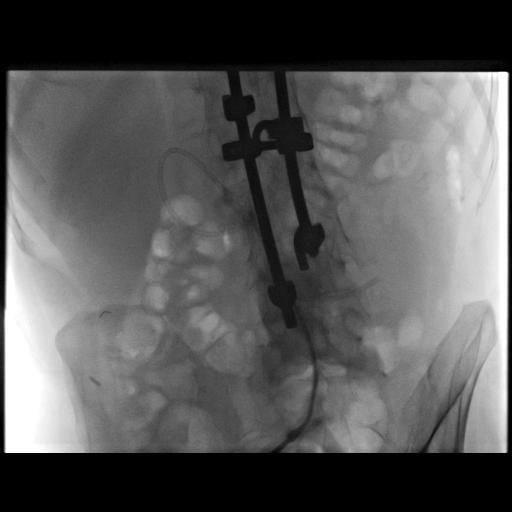
[frame 10/60]
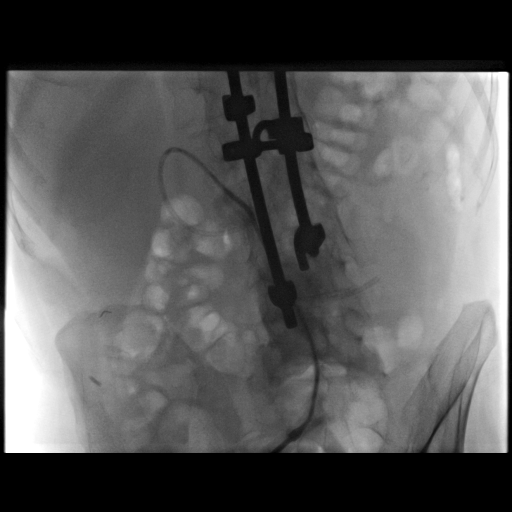
[frame 52/60]
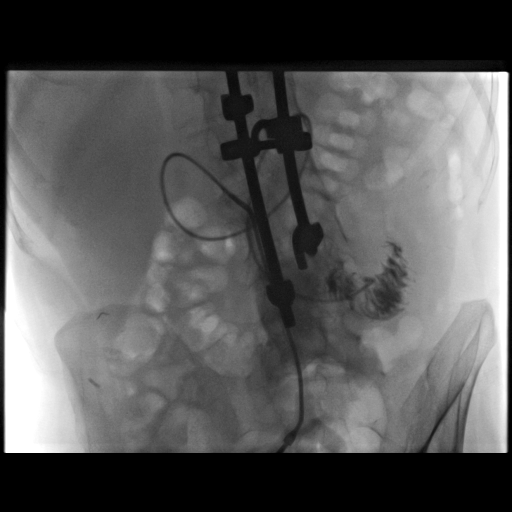

[Series 6: care single · 1 of 1 slices shown (1 of 2)]
[im 1/1]
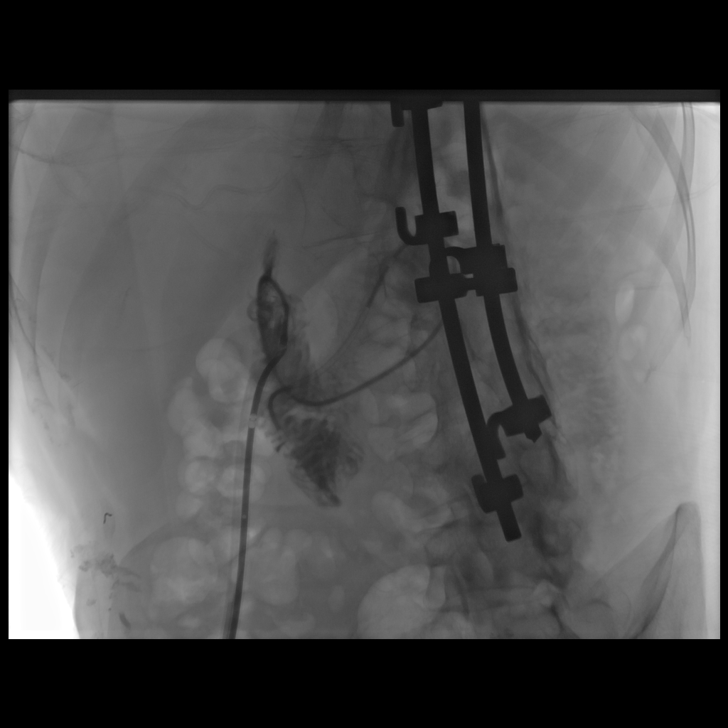

[Series 7: care single · 1 of 1 slices shown (2 of 2)]
[im 1/1]
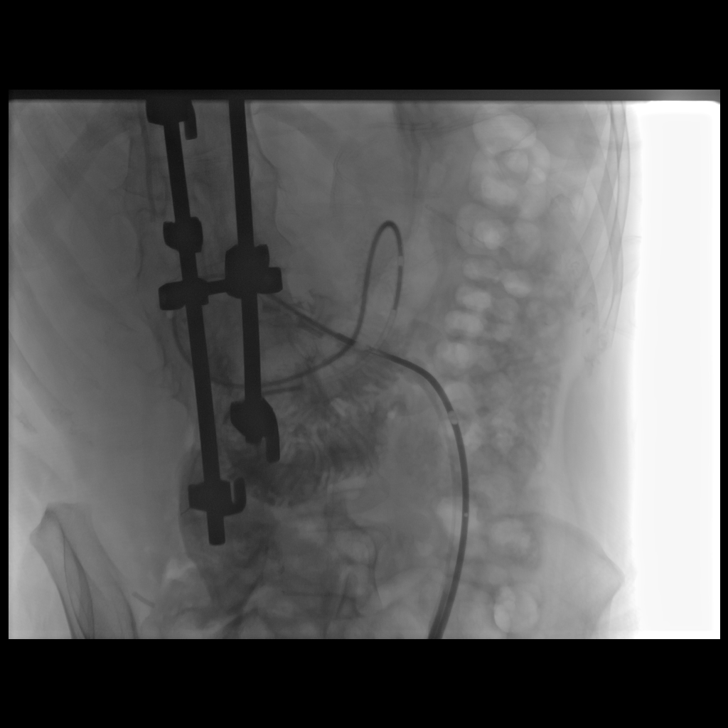

[Series 10: fl - angio · 3 of 74 frames shown (2 of 2)]
[frame 9/74]
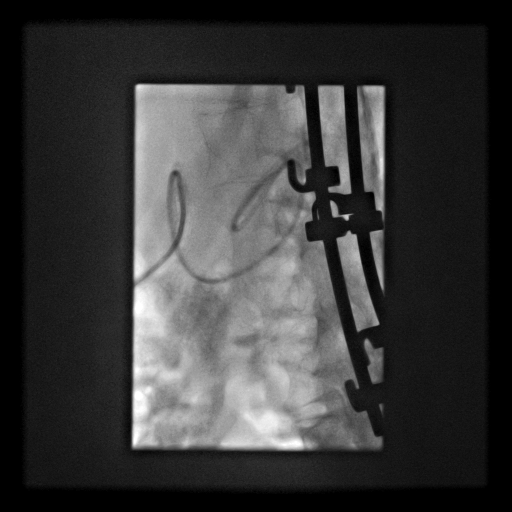
[frame 38/74]
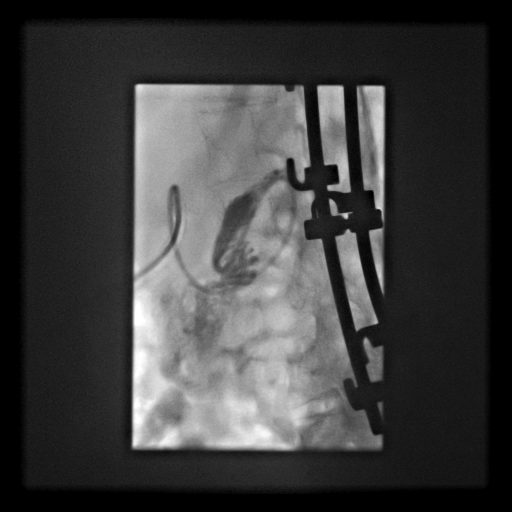
[frame 63/74]
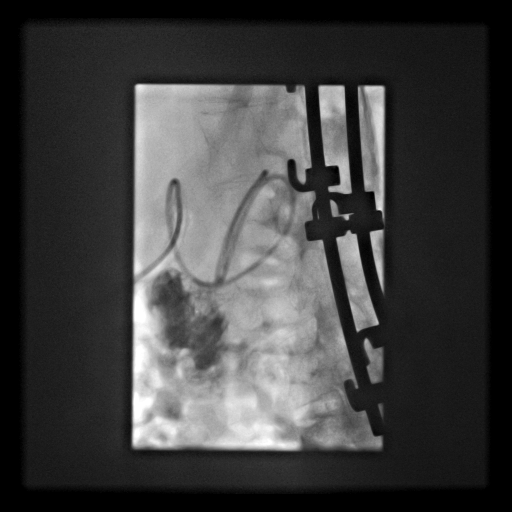

[Series 300: ir cm inj any colonic tube w/fluoro · 5 of 6 slices shown]
[im 1/6]
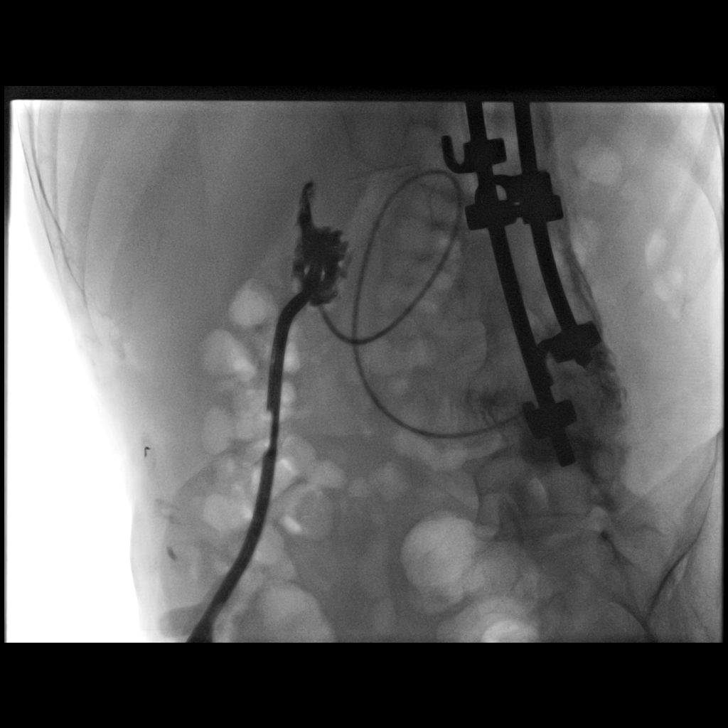
[im 2/6]
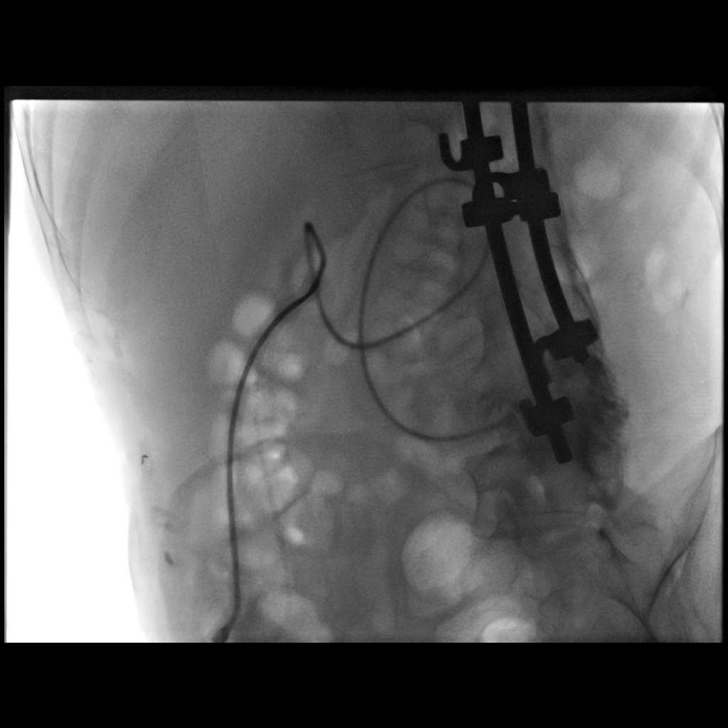
[im 3/6]
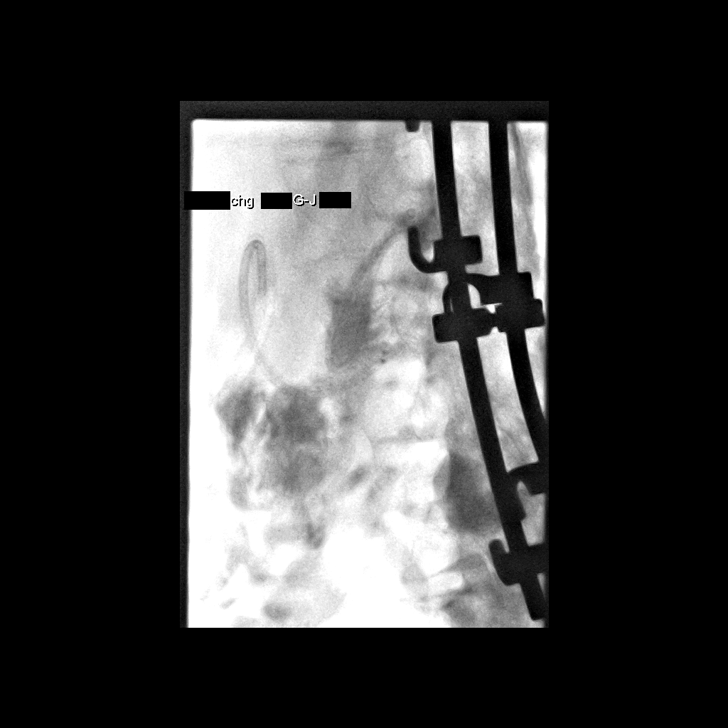
[im 5/6]
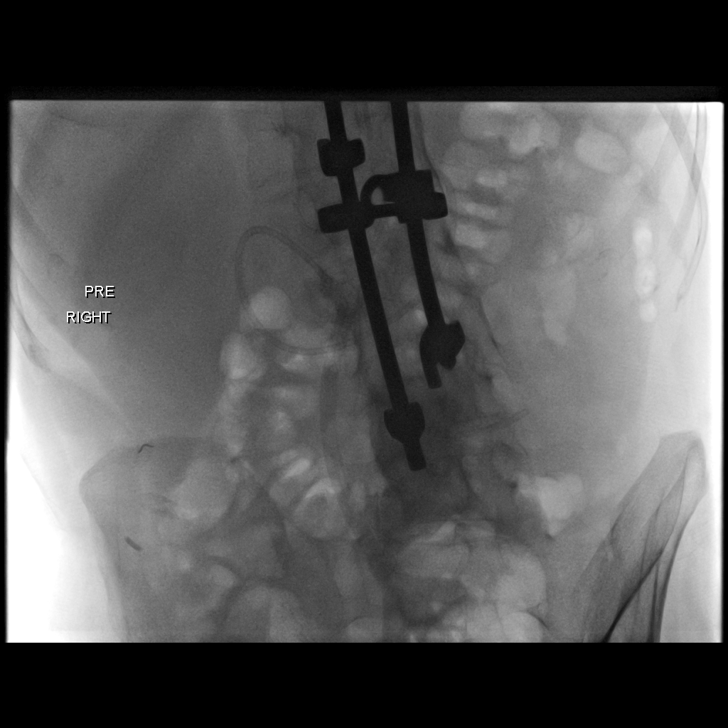
[im 6/6]
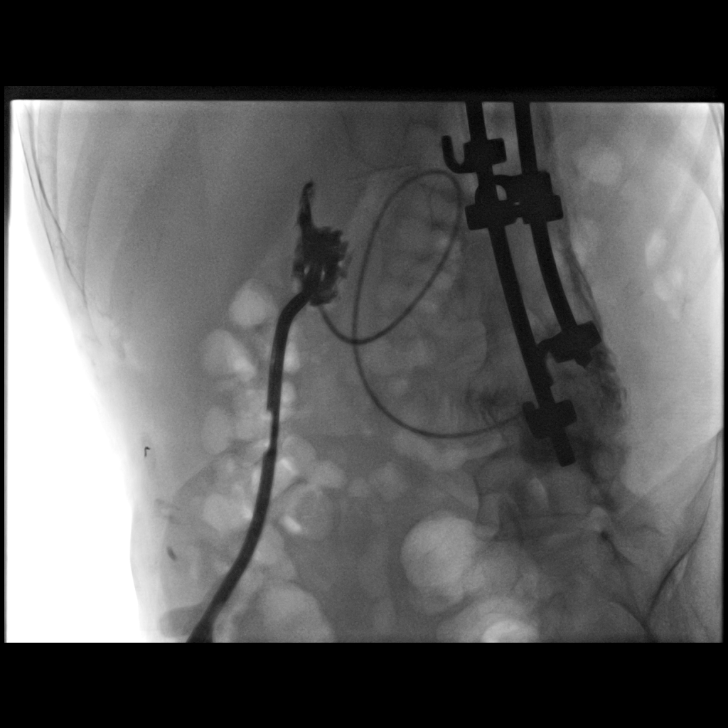

[13 of 16 positions shown; findings below may reference images not displayed]

EXAM:
EXCHANGE GASTROJEJUNAL FEEDING TUBE UNDER FLUOROSCOPY

MEDICATIONS:
None

ANESTHESIA/SEDATION:
None

CONTRAST:  35 mL Tsovue-Q66 - administered into the gastric lumen.

FLUOROSCOPY TIME:  Fluoroscopy Time: 1 minute and 30 seconds.
mGy.

COMPLICATIONS:
None immediate.

PROCEDURE:
Informed written consent was obtained from the patient's mother
after a thorough discussion of the procedural risks, benefits and
alternatives. All questions were addressed. Maximal Sterile Barrier
Technique was utilized including caps, mask, sterile gowns, sterile
gloves, sterile drape, hand hygiene and skin antiseptic. A timeout
was performed prior to the initiation of the procedure.

The pre-existing catheter was injected with contrast material. The
retention balloon was deflated. The gastric portion of the catheter
was retracted outside of the body. The jejunal catheter was cut. It
was then removed over a guidewire.

A new 24 French balloon retention gastrojejunal feeding tube was
prepared. As with prior procedures, the distal roughly 6 cm of the
jejunal catheter was trimmed. The catheter was advanced over a
hydrophilic guidewire. Catheter position was confirmed by
fluoroscopy as the catheter was injected with contrast.
FINDINGS: There was some difficulty passing a guidewire through the jejunal
lumen until the gastric portion of the pre-existing catheter was
retracted and the jejunal lumen cut. This allowed exchange over a
guidewire for a new catheter. The catheter tip lies in the jejunum.
IMPRESSION: Exchange of malfunctioning 24 French balloon retention gastrojejunal
catheter for a new catheter. The jejunal feeding portion lies in the
jejunum.

## 2018-04-01 ENCOUNTER — Telehealth: Payer: Self-pay | Admitting: *Deleted

## 2018-04-01 NOTE — Telephone Encounter (Signed)
Received Physician Orders from [facility]/Kim Arnoldo Morale, RD, LDN; forwarded to provider SLS 05/31

## 2018-04-04 ENCOUNTER — Telehealth: Payer: Self-pay | Admitting: *Deleted

## 2018-04-04 NOTE — Telephone Encounter (Signed)
Received Physician Orders from [facility]; forwarded to provider/SLS 06/03

## 2018-04-08 ENCOUNTER — Telehealth: Payer: Self-pay | Admitting: *Deleted

## 2018-04-08 NOTE — Telephone Encounter (Signed)
Received Physician Orders from [facility]; forwarded to provider/SLS 06/07

## 2018-04-12 DIAGNOSIS — F339 Major depressive disorder, recurrent, unspecified: Secondary | ICD-10-CM | POA: Diagnosis not present

## 2018-04-13 ENCOUNTER — Telehealth: Payer: Self-pay | Admitting: *Deleted

## 2018-04-13 NOTE — Telephone Encounter (Signed)
Received Physician Orders from [facility]; forwarded to provider/SLS 06/12

## 2018-04-19 ENCOUNTER — Ambulatory Visit (INDEPENDENT_AMBULATORY_CARE_PROVIDER_SITE_OTHER): Payer: Medicare Other | Admitting: Internal Medicine

## 2018-04-19 ENCOUNTER — Encounter: Payer: Self-pay | Admitting: Internal Medicine

## 2018-04-19 VITALS — BP 100/70 | HR 74

## 2018-04-19 DIAGNOSIS — Z934 Other artificial openings of gastrointestinal tract status: Secondary | ICD-10-CM

## 2018-04-19 DIAGNOSIS — G800 Spastic quadriplegic cerebral palsy: Secondary | ICD-10-CM | POA: Diagnosis not present

## 2018-04-19 MED ORDER — SUCRALFATE 1 G PO TABS: 1.0000 g | ORAL_TABLET | Freq: Three times a day (TID) | ORAL | 5 refills | Status: DC

## 2018-04-19 NOTE — Patient Instructions (Signed)
We have sent the following medications to your pharmacy for you to pick up at your convenience: Carafate four times daily as needed   Please purchase the following medications over the counter and take as directed: Zegerid   Please follow up as needed.  If you are age 34 or older, your body mass index should be between 23-30. Your There is no height or weight on file to calculate BMI. If this is out of the aforementioned range listed, please consider follow up with your Primary Care Provider.  If you are age 20 or younger, your body mass index should be between 19-25. Your There is no height or weight on file to calculate BMI. If this is out of the aformentioned range listed, please consider follow up with your Primary Care Provider.

## 2018-04-19 NOTE — Progress Notes (Signed)
Subjective:    Patient ID: [name], male    DOB: 07/21/1986, 32 y.o.   MRN: [id number]  HPI [name] is a 32 year old male with a history of cerebral palsy, severe scoliosis/kyphosis with previous spinal fusion, history of esophageal dysphagia and regurgitation now dependent on GJ tube feedings who is here for follow-up.  He is here today with his mother and home health aide.  He was last seen on 10/02/2016.  Overall [name] is doing well recently.  He is doing continuous tube feedings per his jejunostomy tube from about 3-5PM to 7-9 AM daily.  He is using 2 cartons of Jevity and 3 cartons of Vital daily.  They run the rate between 60 and 70 mL's per hour and for the most part he tolerates this well.  He is having a bowel movement daily which have been regular with no further bleeding.  He had an episode of gagging and vomiting on 2 occasions last week which can often lead to esophageal spasm.  In the past when this occurred it was due to his J-tube having migrated proximally and becoming out of position.  He is having his GJ tubes exchanged via IR about every 3 to 6 months.  His last tube was placed around February 03, 2018.  He is receiving medications through the G portion of the tube.  Occasionally he will use Carafate slurry once or twice daily on an as-needed basis.  This is when he is having stomach discomfort and it seems to work well.  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  This was 100 mile race over 28 hours in December.  He is planning another marathon in November in [place].   Review of Systems As per HPI, otherwise negative  Current Medications, Allergies, Past Medical History, Past Surgical History, Family History and Social History were reviewed in [employer] record.     Objective:   Physical Exam BP 100/70   Pulse 74  Constitutional: [name] appears well today sitting in his automatic  wheelchair HEENT:  No scleral icterus. Neck: Neck supple. Trachea midline. Cardiovascular: Normal rate, regular rhythm and intact distal pulses.  Pulmonary/chest: Slightly decreased breath sounds at the right base though there is air movement there, no wheezing, left lung clear  Abdominal: Soft, nontender, nondistended. Bowel sounds active throughout.  GJ tube in place site looks clean without induration or redness Extremities: no clubbing, cyanosis, or edema Skin: Skin is warm and dry. Psychiatric: Normal mood and affect. Behavior is normal.     Assessment & Plan:  32 year old male with a history of cerebral palsy, severe scoliosis/kyphosis with previous spinal fusion, history of esophageal dysphagia and regurgitation now dependent on GJ tube feedings who is here for follow-up.   1.  Cerebral palsy with spasticity/feeding tube dependency --he is doing well with GJ feeds though when he does have reflux/regurgitation it is often due to migration of the G-tube proximally.  If this occurs again his mother will contact IR for tube exchange.  She does an excellent job of maintaining his medical care and needs.  They will continue tube feedings continuous, 12 to 14-hour feeds in the evening and overnight.  Okay for Carafate slurry 2-3 times a day as needed.  They are using this quite infrequently.  Continue tube feedings per advanced home care nutrition specialist.  He will continue Zegerid daily OTC.  25 minutes  spent with the patient today. Greater than 50% was spent in counseling and coordination of care with the patient

## 2018-04-20 ENCOUNTER — Other Ambulatory Visit (HOSPITAL_COMMUNITY): Payer: Self-pay | Admitting: Radiology

## 2018-04-20 DIAGNOSIS — R633 Feeding difficulties, unspecified: Secondary | ICD-10-CM

## 2018-04-21 ENCOUNTER — Encounter (HOSPITAL_COMMUNITY): Payer: Self-pay | Admitting: Diagnostic Radiology

## 2018-04-21 ENCOUNTER — Ambulatory Visit (HOSPITAL_COMMUNITY)
Admission: RE | Admit: 2018-04-21 | Discharge: 2018-04-21 | Disposition: A | Discharge status: Home / Self Care | Payer: Medicare Other | Source: Ambulatory Visit | Attending: Radiology | Admitting: Radiology

## 2018-04-21 DIAGNOSIS — G809 Cerebral palsy, unspecified: Secondary | ICD-10-CM | POA: Diagnosis not present

## 2018-04-21 DIAGNOSIS — Y733 Surgical instruments, materials and gastroenterology and urology devices (including sutures) associated with adverse incidents: Secondary | ICD-10-CM | POA: Insufficient documentation

## 2018-04-21 DIAGNOSIS — R633 Feeding difficulties, unspecified: Secondary | ICD-10-CM

## 2018-04-21 DIAGNOSIS — K9413 Enterostomy malfunction: Secondary | ICD-10-CM | POA: Diagnosis not present

## 2018-04-21 DIAGNOSIS — K9423 Gastrostomy malfunction: Secondary | ICD-10-CM | POA: Diagnosis not present

## 2018-04-21 HISTORY — PX: IR GJ TUBE CHANGE: IMG1440

## 2018-04-21 MED ORDER — IOPAMIDOL (ISOVUE-300) INJECTION 61%
INTRAVENOUS | Status: AC
  Administered 2018-04-21: 25 mL
  Filled 2018-04-21: qty 50

## 2018-04-21 MED ORDER — IOPAMIDOL (ISOVUE-300) INJECTION 61%
25.0000 mL | Freq: Once | INTRAVENOUS | Status: AC | PRN
  Administered 2018-04-21: 25 mL

## 2018-04-21 NOTE — Procedures (Signed)
Successful exchange of GJ tube (22 Fr).  Tip in jejunum.  No blood loss and no immediate complication.

## 2018-04-24 ENCOUNTER — Encounter: Payer: Self-pay | Admitting: Internal Medicine

## 2018-05-03 ENCOUNTER — Telehealth: Payer: Self-pay | Admitting: *Deleted

## 2018-05-03 NOTE — Telephone Encounter (Signed)
Received Physician Orders from [facility]; forwarded to provider/SLS 07/02

## 2018-05-31 ENCOUNTER — Encounter: Payer: Medicare Other | Admitting: Physical Medicine & Rehabilitation

## 2018-06-02 ENCOUNTER — Encounter: Payer: Self-pay | Admitting: Nurse Practitioner

## 2018-06-02 ENCOUNTER — Ambulatory Visit (INDEPENDENT_AMBULATORY_CARE_PROVIDER_SITE_OTHER): Payer: Medicare Other | Admitting: Nurse Practitioner

## 2018-06-02 ENCOUNTER — Other Ambulatory Visit: Payer: Self-pay | Admitting: Physical Medicine & Rehabilitation

## 2018-06-02 VITALS — HR 94 | Temp 98.8°F

## 2018-06-02 DIAGNOSIS — G809 Cerebral palsy, unspecified: Secondary | ICD-10-CM

## 2018-06-02 DIAGNOSIS — J984 Other disorders of lung: Secondary | ICD-10-CM

## 2018-06-02 DIAGNOSIS — M419 Scoliosis, unspecified: Secondary | ICD-10-CM | POA: Diagnosis not present

## 2018-06-02 DIAGNOSIS — G8250 Quadriplegia, unspecified: Secondary | ICD-10-CM

## 2018-06-02 DIAGNOSIS — J209 Acute bronchitis, unspecified: Secondary | ICD-10-CM | POA: Diagnosis not present

## 2018-06-02 MED ORDER — AZITHROMYCIN 200 MG/5ML PO SUSR: 250.0000 mg | Freq: Every day | ORAL | 0 refills | Status: AC

## 2018-06-02 NOTE — Assessment & Plan Note (Signed)
Azithromycin per tube x7 days Continue vest twice daily mucinex liquid per tube as needed Please call if symptoms worsen Keep routine follow up with Dr. Elsworth Soho in April

## 2018-06-02 NOTE — Assessment & Plan Note (Signed)
Continue vest therapy twice daily

## 2018-06-02 NOTE — Patient Instructions (Signed)
Azithromycin per tube x7 days Continue vest twice daily mucinex liquid per tube as needed Please call if symptoms worsen Keep routine follow up with Dr. Elsworth Soho in April

## 2018-06-02 NOTE — Progress Notes (Signed)
@Patient  ID: [name], male    DOB: Oct 06, 1986, 32 y.o.   MRN: [id number]  Chief Complaint  Patient presents with  . Acute Visit    cough, congestion, low grade fever since Monday    Referring provider: [name], MD   32 year old male.- accompanied by mother for follow-up of recurrent pneumonias due to poor secretion clearance. He has cerebral palsy and needs assistance for all activities of daily living, baseline wheelchair-bound. Underwent T9-C6 spinal fusion at [facility] in Sept 2011.  PEG placed in 2012 for hydration.  01/30/2015 - PEJ placed   HPI: Cough  This is a new problem. The current episode started in the past 7 days. The problem has been unchanged. The problem occurs every few minutes. The cough is productive of sputum. Pertinent negatives include no chills, fever, nasal congestion or wheezing. He has tried body position changes and rest (chest vest therapy) for the symptoms. The treatment provided no relief. His past medical history is significant for bronchitis and pneumonia.     Recent [facility] Encounters:   Summary from  [place] 02-11-17 Annual follow-up, accompanied by mother, [name]. He had a good winter and no hospitalizations in the past year. He uses the chest vest , and occasionally complains about it. He remains wheelchair bound, he has been able to clear secretions better. Uses Mucinex only as needed. His weight has not improved much, gets about 4 cans of feeding through the day. Last G-tube lasted for a half months  Mom needs a letter again for Medicaid purposes    01/2015 CT chest with contrast -Volume loss in the RIGHT lower lobe with rounding of vessels suggesting rounded atelectasis, Pleural fluid collection at posterior inferior RIGHT hemi thorax     Allergies  Allergen Reactions  . Ambien [Zolpidem Tartrate] Nausea Only  . Antihistamines, Chlorpheniramine-Type     Other reaction(s): Other (See Comments) Other  Reaction: agitation  . Codeine Other (See Comments)    Makes patient too active after a few days.  . Metoclopramide Other (See Comments)    Delusion, emotionality   . Baclofen Anxiety  . Pheniramine Rash    Other reaction(s): Other (See Comments) Other Reaction: agitation  . Sulfa Antibiotics Rash  . Sulfonamide Derivatives Rash    Immunization History  Administered Date(s) Administered  . Hep A / Hep B 04/07/2011, 05/14/2011  . Influenza Split 09/01/2011  . Influenza Whole 10/11/2007, 08/22/2008, 08/15/2009, 08/05/2010, 08/11/2012  . Influenza,inj,Quad PF,6+ Mos 08/06/2013, 07/16/2014, 08/15/2015, 08/27/2016  . Influenza-Unspecified 08/26/2017  . PPD Test 04/07/2011  . Pneumococcal Conjugate-13 08/27/2016  . Pneumococcal Polysaccharide-23 08/15/2009  . Td 06/14/2002, 06/19/2009    Past Medical History:  Diagnosis Date  . Cerebral palsy ([place])   . Dehydration 11/22/2013  . Depression with anxiety 08/01/2010   Qualifier: Diagnosis of  By: Nelson-Smith CMA (AAMA), Dottie    . Dyslipidemia 08/19/2017  . Esophagitis 2011  . Gastrostomy in place [facility]) 08/31/2013  . GERD (gastroesophageal reflux disease)   . Hyperlipidemia, mild 08/25/2015  . Hyperthyroidism   . Incontinence of feces   . Loss of weight 08/28/2014  . Medicare annual wellness visit, subsequent 08/25/2015  . Mildly underweight adult 03/16/2017  . Palpitations   . Skin lesion of right ear 03/16/2017  . Thyroid disease 08/01/2010   Qualifier: Diagnosis of  By: Nelson-Smith CMA (AAMA), Dottie      Tobacco History: Social History   Tobacco Use  Smoking Status Never Smoker  Smokeless Tobacco Never Used   Counseling given: Never smoker   Outpatient Encounter Medications as of 06/02/2018  Medication Sig  . AMBULATORY NON FORMULARY MEDICATION Medication Name: MIC gastrostomy/bolus feeding tube 24 French Part number 0110-24. #2 and 10 cc lurer lock syringe #2 Dx:  . bacitracin 500 UNIT/GM ointment Apply 1  application topically 2 (two) times daily.  . clotrimazole-betamethasone (LOTRISONE) cream Apply 1 application topically 2 (two) times daily.  . dantrolene (DANTRIUM) 50 MG capsule TAKE 1 CAPSULE BY MOUTH 3 TIMES A DAY.  . divalproex (DEPAKOTE SPRINKLE) 125 MG capsule Take 2 capsules in morning, 5 capsules at bedtime (Patient taking differently: 250-625 mg. Take 2 capsules in morning, 5 capsules at bedtime)  . Incontinence Supply Disposable (PREVAIL BREEZERS MEDIUM) MISC pkg of 16- size medium 32" to 44"  Breathable cloth-like outer fabric (can't use the plastic outer surgace  Item # PVB-012/2  . LORazepam (ATIVAN) 1 MG tablet Take one tablet in evenings as needed for insomnia. (Patient taking differently: Take one tablet in evenings for insomnia.)  . mupirocin ointment (BACTROBAN) 2 % Apply to area thin film twice daily if needed  . NON FORMULARY Bard Leg Bag Extension tubing w/Connector 18", Sterile, latex-free  Item# [id number]  . NON FORMULARY Colorplast Freedom Cath Latex Self-Adhering Male External Catheter 50mm Diameter Intermediate  Item# [id number]  . NONFORMULARY OR COMPOUNDED Suzy Bouchard REF [id number] - Kangaroo Joey Pump Set with Flush Bags - 1000 mL  . Nutritional Supplements (FEEDING SUPPLEMENT, JEVITY 1.5 CAL,) LIQD Run tube feed at 80 mL per hour over 15 hours. (3 cartons)  . Nutritional Supplements (FEEDING SUPPLEMENT, VITAL 1.5 CAL,) LIQD Place 80 mLs into feeding tube continuous. 2 cartons  . OLANZapine (ZYPREXA) 2.5 MG tablet Place 5 mg into feeding tube 2 (two) times daily.   Earney Navy Bicarbonate (ZEGERID) 20-1100 MG CAPS capsule Take 1 capsule by mouth daily before breakfast.  . Ostomy Supplies (PROTECTIVE BARRIER WIPES) MISC 1-1/4" X 3"  Item #[id number]  . PARoxetine (PAXIL) 10 MG tablet Take 15 mg by mouth daily.  . sucralfate (CARAFATE) 1 g tablet Take 1 tablet (1 g total) by mouth 4 (four) times daily -  with meals and at bedtime.  Marland Kitchen azithromycin (ZITHROMAX) 200  MG/5ML suspension Place 6.3 mLs (250 mg total) into feeding tube daily for 7 days.   No facility-administered encounter medications on file as of 06/02/2018.      Review of Systems  Review of Systems  Constitutional: Negative for chills and fever.  HENT: Negative.   Respiratory: Positive for cough. Negative for wheezing.   Skin: Negative.   Neurological: Negative.   Psychiatric/Behavioral: Negative.        Physical Exam  Pulse 94   Temp 98.8 F (37.1 C) (Axillary)   SpO2 95%   Wt Readings from Last 5 Encounters:  08/19/17 101 lb (45.8 kg)  03/16/17 95 lb (43.1 kg)  11/26/16 96 lb (43.5 kg)  10/02/16 93 lb (42.2 kg)  09/02/16 93 lb (42.2 kg)     Physical Exam  Constitutional: He is oriented to person, place, and time. No distress.  Cardiovascular: Normal rate and regular rhythm.  Pulmonary/Chest: Effort normal and breath sounds normal.  Frequent harsh cough  Musculoskeletal:  CP with contractures to bilateral upper extremities. Wheel chair dependent.    Neurological: He is alert and oriented to person, place, and time.  Skin: Skin is warm and dry.  Psychiatric: He has a normal mood and affect.  Nursing note  and vitals reviewed.    Lab Results:  CBC    Component Value Date/Time   WBC 5.3 01/21/2018 1523   RBC 5.00 01/21/2018 1523   HGB 15.6 01/21/2018 1523   HCT 44.8 01/21/2018 1523   PLT 210 01/21/2018 1523   MCV 89.6 01/21/2018 1523   MCH 31.2 01/21/2018 1523   MCHC 34.8 01/21/2018 1523   RDW 12.1 01/21/2018 1523   LYMPHSABS 1,913 01/21/2018 1523   MONOABS 0.3 09/02/2016 1513   EOSABS 80 01/21/2018 1523   BASOSABS 21 01/21/2018 1523    BMET    Component Value Date/Time   NA 136 01/21/2018 1523   K 4.2 01/21/2018 1523   CL 101 01/21/2018 1523   CO2 27 01/21/2018 1523   GLUCOSE 95 01/21/2018 1523   BUN 9 01/21/2018 1523   CREATININE 0.34 (L) 01/21/2018 1523   CALCIUM 9.3 01/21/2018 1523   GFRNONAA >90 09/18/2014 1123   GFRAA >90 09/18/2014  1123    BNP No results found for: BNP  ProBNP No results found for: PROBNP  Imaging: No results found.   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 Elsworth Soho in April      Fenton Foy, NP 06/02/2018

## 2018-06-07 ENCOUNTER — Encounter: Payer: Self-pay | Admitting: Physical Medicine & Rehabilitation

## 2018-06-07 ENCOUNTER — Telehealth: Payer: Self-pay | Admitting: *Deleted

## 2018-06-07 ENCOUNTER — Encounter: Payer: Medicare Other | Attending: Physical Medicine & Rehabilitation | Admitting: Physical Medicine & Rehabilitation

## 2018-06-07 VITALS — BP 101/69 | HR 80 | Ht 65.0 in | Wt 105.0 lb

## 2018-06-07 DIAGNOSIS — G8250 Quadriplegia, unspecified: Secondary | ICD-10-CM | POA: Diagnosis not present

## 2018-06-07 DIAGNOSIS — G808 Other cerebral palsy: Secondary | ICD-10-CM

## 2018-06-07 NOTE — Progress Notes (Signed)
Botox Injection for spasticity using needle EMG guidance Indication: Spastic tetraplegia (HCC)  Other cerebral palsy (HCC)   Dilution: 100 Units/ml        Total Units Injected: 600 Indication: Severe spasticity which interferes with ADL,mobility and/or  hygiene and is unresponsive to medication management and other conservative care Informed consent was obtained after describing risks and benefits of the procedure with the patient. This includes bleeding, bruising, infection, excessive weakness, or medication side effects. A REMS form is on file and signed.  Needle: 64mm injectable monopolar needle electrode Right Number of units per muscle Pectoralis Major 0 units Pectoralis Minor 0 units Biceps 100 units Brachioradialis 50 units FCR 25 units FCU 25 units FDS 50 units FDP 50 units FPL 0 units Pronator Teres 0 units Pronator Quadratus 0 units Lumbricals 50 units  Left Biceps 25 units Brachioradialis 75 units FCR 25 units FCU 25 units FDS 50 units FDP 50 units FPL 0 units Pronator Teres 0 units Pronator Quadratus 0 units Lumbricals 50units      All injections were done after obtaining appropriate EMG activity and after negative drawback for blood. The patient tolerated the procedure well. Post procedure instructions were given. Return in about 3 months (around 09/07/2018) for can do follow up botox at this visit. Marland Kitchen

## 2018-06-07 NOTE — Telephone Encounter (Signed)
Received Physician Orders from [facility]; forwarded to provider/SLS 08/06

## 2018-06-07 NOTE — Patient Instructions (Signed)
PLEASE FEEL FREE TO CALL OUR OFFICE WITH ANY PROBLEMS OR QUESTIONS ([phone])      

## 2018-07-06 DIAGNOSIS — N1330 Unspecified hydronephrosis: Secondary | ICD-10-CM | POA: Diagnosis not present

## 2018-07-12 DIAGNOSIS — H1033 Unspecified acute conjunctivitis, bilateral: Secondary | ICD-10-CM | POA: Diagnosis not present

## 2018-07-14 ENCOUNTER — Telehealth: Payer: Self-pay | Admitting: *Deleted

## 2018-07-14 NOTE — Telephone Encounter (Signed)
Received Physician Orders from [facility] for Renewal Medicaid Forms /Incontinence and Catheter Supplies; forwarded to provider/SLS 09/12

## 2018-07-18 ENCOUNTER — Telehealth: Payer: Self-pay | Admitting: *Deleted

## 2018-07-18 NOTE — Telephone Encounter (Signed)
Received Physician Orders from [facility]; forwarded to provider/SLS

## 2018-08-16 ENCOUNTER — Ambulatory Visit (INDEPENDENT_AMBULATORY_CARE_PROVIDER_SITE_OTHER): Payer: Medicare Other | Admitting: Family Medicine

## 2018-08-16 DIAGNOSIS — E785 Hyperlipidemia, unspecified: Secondary | ICD-10-CM

## 2018-08-16 DIAGNOSIS — R946 Abnormal results of thyroid function studies: Secondary | ICD-10-CM

## 2018-08-16 DIAGNOSIS — Z23 Encounter for immunization: Secondary | ICD-10-CM | POA: Diagnosis not present

## 2018-08-16 DIAGNOSIS — K625 Hemorrhage of anus and rectum: Secondary | ICD-10-CM

## 2018-08-16 DIAGNOSIS — G808 Other cerebral palsy: Secondary | ICD-10-CM | POA: Diagnosis not present

## 2018-08-16 DIAGNOSIS — R1314 Dysphagia, pharyngoesophageal phase: Secondary | ICD-10-CM | POA: Diagnosis not present

## 2018-08-16 DIAGNOSIS — K219 Gastro-esophageal reflux disease without esophagitis: Secondary | ICD-10-CM

## 2018-08-16 NOTE — Assessment & Plan Note (Addendum)
No further po intake and doing well.

## 2018-08-16 NOTE — Assessment & Plan Note (Signed)
He is now NPO and doing very well. No trouble with aspiration or respiratory infection since the switch

## 2018-08-16 NOTE — Patient Instructions (Signed)

## 2018-08-16 NOTE — Progress Notes (Signed)
Subjective:    Patient ID: [name], male    DOB: 1986-09-02, 32 y.o.   MRN: [id number]  No chief complaint on file.   HPI Patient is in today for follow-up.  He is brought in today by an aide and his mother.  They report he is doing very well.  He is completely n.p.o. now and as a result has had no real trouble with cough or dysphagia.  No heartburn.  He offers no complaints such as fevers, abdominal pain or hospitalizations.  Continues to follow with physiatry and is doing well. Denies CP/palp/SOB/HA/congestion/fevers/GI or GU c/o. Taking meds as prescribed  Past Medical History:  Diagnosis Date  . Cerebral palsy ([place])   . Dehydration 11/22/2013  . Depression with anxiety 08/01/2010   Qualifier: Diagnosis of  By: Nelson-Smith CMA (AAMA), Dottie    . Dyslipidemia 08/19/2017  . Esophagitis 2011  . 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 GERD (gastroesophageal reflux disease)   . Hyperlipidemia, mild 08/25/2015  . Hyperthyroidism   . Incontinence of feces   . Loss of weight 08/28/2014  . Medicare annual wellness visit, subsequent 08/25/2015  . Mildly underweight adult 03/16/2017  . Palpitations   . Skin lesion of right ear 03/16/2017  . Thyroid disease 08/01/2010   Qualifier: Diagnosis of  By: Harlon Ditty CMA (AAMA), Dottie      Past Surgical History:  Procedure Laterality Date  . baclofen trial    . baslofen pump implant    . ears tubes    . EYE SURGERY    . FLEXIBLE SIGMOIDOSCOPY N/A 09/07/2014   Procedure: FLEXIBLE SIGMOIDOSCOPY;  Surgeon: [name], MD;  Location: [facility] ENDOSCOPY;  Service: Endoscopy;  Laterality: N/A;  . g-tube insert  August 2006  . hamstring released     to treat contractures.   Marland Kitchen HIP SURGERY     x2 , side   . IR CM INJ ANY COLONIC TUBE W/FLUORO  05/28/2017  . IR GENERIC HISTORICAL  07/01/2016   IR GASTR TUBE CONVERT GASTR-JEJ PER W/FL MOD SED 07/01/2016 [name], MD [facility]INTER[facility]  . IR GENERIC HISTORICAL  07/08/2016   IR PATIENT EVAL TECH  0-60 MINS 07/08/2016 [name], MD [facility]  . IR GENERIC HISTORICAL  07/14/2016   IR GJ TUBE CHANGE 07/14/2016 [name], MD [facility]  . IR GENERIC HISTORICAL  07/21/2016   IR PATIENT EVAL TECH 0-60 MINS [facility]  . IR GENERIC HISTORICAL  08/31/2016   IR [place] DUODEN/JEJUNO TUBE PERCUT W/FLUORO 08/31/2016 [name], MD [facility]  . IR GENERIC HISTORICAL  09/03/2016   IR GJ TUBE CHANGE 09/03/2016 [name], MD [facility]  . IR GENERIC HISTORICAL  09/10/2016   IR GASTR TUBE CONVERT GASTR-JEJ PER W/FL MOD SED 09/10/2016 [facility]  . IR GENERIC HISTORICAL  09/16/2016   IR PATIENT EVAL TECH 0-60 MINS [facility]  . IR GENERIC HISTORICAL  09/29/2016   IR GJ TUBE CHANGE 09/29/2016 [name], MD [facility]  . IR GJ TUBE CHANGE  02/05/2017  . IR GJ TUBE CHANGE  05/21/2017  . IR GJ TUBE CHANGE  08/25/2017  . IR GJ TUBE CHANGE  01/18/2018  . IR GJ TUBE CHANGE  02/04/2018  . IR GJ TUBE CHANGE  04/21/2018  . PEG PLACEMENT  10/21/2011   Procedure: PERCUTANEOUS ENDOSCOPIC GASTROSTOMY (PEG) REPLACEMENT;  Surgeon: [name], MD;  Location: [facility] ENDOSCOPY;  Service: Endoscopy;  Laterality: N/A;  . PEG PLACEMENT N/A 06/13/2013  Procedure: PERCUTANEOUS ENDOSCOPIC GASTROSTOMY (PEG) REPLACEMENT;  Surgeon: [name], MD;  Location: [facility] ENDOSCOPY;  Service: Endoscopy;  Laterality: N/A;  . SPINAL FUSION    . spinal fusion to correct 70 degree kyphosis  11-2010  . spinal fusioncorrect 106 degree kyphosis    . SPINE SURGERY  ,11/20/2010, 2011   for correction of severe contracturing spinal kyphosis.   . TONSILLECTOMY      Family History  Problem Relation Age of Onset  . Asthma Mother   . Hyperlipidemia Mother   . COPD Mother   . Other Mother        bronchial stasis/ABPA  . Cancer Maternal Grandmother 85       breast  . Hyperlipidemia Maternal Grandmother   . Hypertension Maternal Grandmother   . Cancer Maternal Grandfather        prostate  . Heart disease  Paternal Grandfather        CHF  . Osteoporosis Paternal Grandmother   . Arthritis Paternal Grandmother        rheumatoid    Social History   Socioeconomic History  . Marital status: Single    Spouse name: Not on file  . Number of children: 0  . Years of education: Not on file  . Highest education level: Not on file  Occupational History  . Occupation: disbaled  Social Needs  . Financial resource strain: Not on file  . Food insecurity:    Worry: Not on file    Inability: Not on file  . Transportation needs:    Medical: Not on file    Non-medical: Not on file  Tobacco Use  . Smoking status: Never Smoker  . Smokeless tobacco: Never Used  Substance and Sexual Activity  . Alcohol use: No  . Drug use: No  . Sexual activity: Never  Lifestyle  . Physical activity:    Days per week: Not on file    Minutes per session: Not on file  . Stress: Not on file  Relationships  . Social connections:    Talks on phone: Not on file    Gets together: Not on file    Attends religious service: Not on file    Active member of club or organization: Not on file    Attends meetings of clubs or organizations: Not on file    Relationship status: Not on file  . Intimate partner violence:    Fear of current or ex partner: Not on file    Emotionally abused: Not on file    Physically abused: Not on file    Forced sexual activity: Not on file  Other Topics Concern  . Not on file  Social History Narrative  . Not on file    Outpatient Medications Prior to Visit  Medication Sig Dispense Refill  . AMBULATORY NON FORMULARY MEDICATION Medication Name: MIC gastrostomy/bolus feeding tube 24 French Part number 0110-24. #2 and 10 cc lurer lock syringe #2 Dx: 4 Device 2  . bacitracin 500 UNIT/GM ointment Apply 1 application topically 2 (two) times daily. 30 g 1  . clotrimazole-betamethasone (LOTRISONE) cream Apply 1 application topically 2 (two) times daily. 45 g 1  . dantrolene (DANTRIUM) 50 MG  capsule TAKE 1 CAPSULE BY MOUTH 3 TIMES A DAY. 90 capsule 4  . divalproex (DEPAKOTE SPRINKLE) 125 MG capsule Take 2 capsules in morning, 5 capsules at bedtime (Patient taking differently: 250-625 mg. Take 2 capsules in morning, 5 capsules at bedtime) 210 capsule 4  . Incontinence  Supply Disposable (PREVAIL BREEZERS MEDIUM) MISC pkg of 16- size medium 32" to 44"  Breathable cloth-like outer fabric (can't use the plastic outer surgace  Item # PVB-012/2 16 each 6  . LORazepam (ATIVAN) 1 MG tablet Take one tablet in evenings as needed for insomnia. (Patient taking differently: Take one tablet in evenings for insomnia.) 30 tablet 1  . mupirocin ointment (BACTROBAN) 2 % Apply to area thin film twice daily if needed 22 g 0  . NON FORMULARY Bard Leg Bag Extension tubing w/Connector 18", Sterile, latex-free  Item# [id number]    . NON FORMULARY Colorplast Freedom Cath Latex Self-Adhering Male External Catheter 54mm Diameter Intermediate  Item# [id number]    . NONFORMULARY OR COMPOUNDED ITEM Cov[employer]ien REF [id number] - Kangaroo Joey Pump Set with Flush Bags - 1000 mL 1 each 0  . Nutritional Supplements (FEEDING SUPPLEMENT, JEVITY 1.5 CAL,) LIQD Run tube feed at 80 mL per hour over 15 hours. (3 cartons) 80 mL 11  . Nutritional Supplements (FEEDING SUPPLEMENT, VITAL 1.5 CAL,) LIQD Place 80 mLs into feeding tube continuous. 2 cartons 80 mL 11  . OLANZapine (ZYPREXA) 2.5 MG tablet Place 5 mg into feeding tube 2 (two) times daily.   3  . Omeprazole-Sodium Bicarbonate (ZEGERID) 20-1100 MG CAPS capsule Take 1 capsule by mouth daily before breakfast.    . Ostomy Supplies (PROTECTIVE BARRIER WIPES) MISC 1-1/4" X 3"  Item #[id number] 75 each 6  . PARoxetine (PAXIL) 10 MG tablet Take 15 mg by mouth daily.    . sucralfate (CARAFATE) 1 g tablet Take 1 tablet (1 g total) by mouth 4 (four) times daily -  with meals and at bedtime. 120 tablet 5   No facility-administered medications prior to visit.     Allergies  Allergen  Reactions  . Ambien [Zolpidem Tartrate] Nausea Only  . Antihistamines, Chlorpheniramine-Type     Other reaction(s): Other (See Comments) Other Reaction: agitation  . Codeine Other (See Comments)    Makes patient too active after a few days.  . Metoclopramide Other (See Comments)    Delusion, emotionality   . Baclofen Anxiety  . Pheniramine Rash    Other reaction(s): Other (See Comments) Other Reaction: agitation  . Sulfa Antibiotics Rash  . Sulfonamide Derivatives Rash    Review of Systems  Constitutional: Negative for fever and malaise/fatigue.  HENT: Negative for congestion.   Eyes: Negative for blurred vision.  Respiratory: Negative for shortness of breath.   Cardiovascular: Negative for chest pain, palpitations and leg swelling.  Gastrointestinal: Negative for abdominal pain, blood in stool and nausea.  Genitourinary: Negative for dysuria and frequency.  Musculoskeletal: Negative for falls.  Skin: Negative for rash.  Neurological: Negative for dizziness, loss of consciousness and headaches.  Endo/Heme/Allergies: Negative for environmental allergies.  Psychiatric/Behavioral: Negative for depression. The patient is not nervous/anxious.        Objective:    Physical Exam  Constitutional: He is oriented to person, place, and time. He appears well-developed and well-nourished. No distress.  HENT:  Head: Normocephalic and atraumatic.  Nose: Nose normal.  Eyes: Right eye exhibits no discharge. Left eye exhibits no discharge.  Neck: Normal range of motion. Neck supple.  Cardiovascular: Normal rate and regular rhythm.  No murmur heard. Pulmonary/Chest: Effort normal and breath sounds normal.  Abdominal: Soft. Bowel sounds are normal. He exhibits no distension and no mass. There is no tenderness. There is no guarding.  LUQ feeding tube in place  Musculoskeletal: He exhibits deformity. He exhibits no edema  or tenderness.  Neurological: He is alert and oriented to person,  place, and time. He displays abnormal reflex. A cranial nerve deficit is present. He exhibits abnormal muscle tone. Coordination abnormal.  Skin: Skin is warm and dry.  Psychiatric: He has a normal mood and affect.  Nursing note and vitals reviewed.   Wt 103 lb 6.4 oz (46.9 kg)   BMI 17.21 kg/m  Wt Readings from Last 3 Encounters:  08/16/18 103 lb 6.4 oz (46.9 kg)  06/07/18 105 lb (47.6 kg)  08/19/17 101 lb (45.8 kg)     Lab Results  Component Value Date   WBC 5.4 08/16/2018   HGB 15.0 08/16/2018   HCT 44.7 08/16/2018   PLT 219.0 08/16/2018   GLUCOSE 67 (L) 08/16/2018   CHOL 119 08/16/2018   TRIG 78.0 08/16/2018   HDL 32.70 (L) 08/16/2018   LDLCALC 70 08/16/2018   ALT 31 08/16/2018   AST 21 08/16/2018   NA 138 08/16/2018   K 4.1 08/16/2018   CL 101 08/16/2018   CREATININE 0.34 (L) 08/16/2018   BUN 8 08/16/2018   CO2 28 08/16/2018   TSH 2.22 08/16/2018   HGBA1C 5.0 04/29/2007    Lab Results  Component Value Date   TSH 2.22 08/16/2018   Lab Results  Component Value Date   WBC 5.4 08/16/2018   HGB 15.0 08/16/2018   HCT 44.7 08/16/2018   MCV 90.4 08/16/2018   PLT 219.0 08/16/2018   Lab Results  Component Value Date   NA 138 08/16/2018   K 4.1 08/16/2018   CO2 28 08/16/2018   GLUCOSE 67 (L) 08/16/2018   BUN 8 08/16/2018   CREATININE 0.34 (L) 08/16/2018   BILITOT 0.4 08/16/2018   ALKPHOS 90 08/16/2018   AST 21 08/16/2018   ALT 31 08/16/2018   PROT 7.4 08/16/2018   ALBUMIN 4.3 08/16/2018   CALCIUM 9.3 08/16/2018   ANIONGAP 11 09/18/2014   GFR 314.92 08/16/2018   Lab Results  Component Value Date   CHOL 119 08/16/2018   Lab Results  Component Value Date   HDL 32.70 (L) 08/16/2018   Lab Results  Component Value Date   LDLCALC 70 08/16/2018   Lab Results  Component Value Date   TRIG 78.0 08/16/2018   Lab Results  Component Value Date   CHOLHDL 4 08/16/2018   Lab Results  Component Value Date   HGBA1C 5.0 04/29/2007       Assessment  & Plan:   Problem List Items Addressed This Visit    PALSY, INFANTILE CEREBRAL, QUADRIPLEGIC    Follows with physiatry and they have used Botox injections a lot and that has helped the spasticity. They use it in the arms especially with good relief. They are working on getting a new conversion Lucianne Lei and forms are completed today      GERD    No further po intake and doing well.       Relevant Orders   Comprehensive metabolic panel (Completed)   Abnormal thyroid function test    Check TSH      Relevant Orders   TSH (Completed)   Dysphagia, pharyngoesophageal phase    He is now NPO and doing very well. No trouble with aspiration or respiratory infection since the switch      Rectal bleeding    No recent episodes      Relevant Orders   CBC (Completed)   Dyslipidemia    Encouraged heart healthy diet, increase exercise, avoid trans fats,  consider a krill oil cap daily      Relevant Orders   Lipid panel (Completed)      I am having Xylon S. Spradlin "[name]" maintain his divalproex, clotrimazole-betamethasone, Omeprazole-Sodium Bicarbonate, NON FORMULARY, NON FORMULARY, Protective Barrier Wipes, PREVAIL BREEZERS MEDIUM, bacitracin, NONFORMULARY OR COMPOUNDED ITEM, LORazepam, PARoxetine, AMBULATORY NON FORMULARY MEDICATION, OLANZapine, mupirocin ointment, feeding supplement (JEVITY 1.5 CAL), feeding supplement (VITAL 1.5 CAL), sucralfate, and dantrolene.  No orders of the defined types were placed in this encounter.    [name], MD

## 2018-08-16 NOTE — Assessment & Plan Note (Signed)
Encouraged heart healthy diet, increase exercise, avoid trans fats, consider a krill oil cap daily 

## 2018-08-16 NOTE — Assessment & Plan Note (Addendum)
Follows with physiatry and they have used Botox injections a lot and that has helped the spasticity. They use it in the arms especially with good relief. They are working on getting a new conversion [name] and forms are completed today

## 2018-08-16 NOTE — Assessment & Plan Note (Signed)
No recent episodes

## 2018-08-16 NOTE — Assessment & Plan Note (Signed)
Check TSH 

## 2018-08-17 ENCOUNTER — Other Ambulatory Visit (HOSPITAL_COMMUNITY): Payer: Self-pay | Admitting: Diagnostic Radiology

## 2018-08-17 ENCOUNTER — Other Ambulatory Visit (HOSPITAL_COMMUNITY): Payer: Self-pay

## 2018-08-17 DIAGNOSIS — R633 Feeding difficulties, unspecified: Secondary | ICD-10-CM

## 2018-08-17 LAB — LIPID PANEL
Cholesterol: 119 mg/dL (ref 0–200)
HDL: 32.7 mg/dL — ABNORMAL LOW (ref >39.00)
LDL Cholesterol: 70 mg/dL (ref 0–99)
NonHDL: 86.02
Total CHOL/HDL Ratio: 4
Triglycerides: 78 mg/dL (ref 0.0–149.0)
VLDL: 15.6 mg/dL (ref 0.0–40.0)

## 2018-08-17 LAB — CBC
HCT: 44.7 % (ref 39.0–52.0)
Hemoglobin: 15 g/dL (ref 13.0–17.0)
MCHC: 33.6 g/dL (ref 30.0–36.0)
MCV: 90.4 fl (ref 78.0–100.0)
Platelets: 219 10*3/uL (ref 150.0–400.0)
RBC: 4.94 Mil/uL (ref 4.22–5.81)
RDW: 12.9 % (ref 11.5–15.5)
WBC: 5.4 10*3/uL (ref 4.0–10.5)

## 2018-08-17 LAB — COMPREHENSIVE METABOLIC PANEL
ALT: 31 U/L (ref 0–53)
AST: 21 U/L (ref 0–37)
Albumin: 4.3 g/dL (ref 3.5–5.2)
Alkaline Phosphatase: 90 U/L (ref 39–117)
BUN: 8 mg/dL (ref 6–23)
CO2: 28 mEq/L (ref 19–32)
Calcium: 9.3 mg/dL (ref 8.4–10.5)
Chloride: 101 mEq/L (ref 96–112)
Creatinine, Ser: 0.34 mg/dL — ABNORMAL LOW (ref 0.40–1.50)
GFR: 314.92 mL/min (ref >60.00)
Glucose, Bld: 67 mg/dL — ABNORMAL LOW (ref 70–99)
Potassium: 4.1 mEq/L (ref 3.5–5.1)
Sodium: 138 mEq/L (ref 135–145)
Total Bilirubin: 0.4 mg/dL (ref 0.2–1.2)
Total Protein: 7.4 g/dL (ref 6.0–8.3)

## 2018-08-17 LAB — TSH: TSH: 2.22 u[IU]/mL (ref 0.35–4.50)

## 2018-08-18 ENCOUNTER — Encounter (HOSPITAL_COMMUNITY): Payer: Self-pay | Admitting: Diagnostic Radiology

## 2018-08-18 ENCOUNTER — Ambulatory Visit (HOSPITAL_COMMUNITY)
Admission: RE | Admit: 2018-08-18 | Discharge: 2018-08-18 | Disposition: A | Discharge status: Home / Self Care | Payer: Medicare Other | Source: Ambulatory Visit | Attending: Diagnostic Radiology | Admitting: Diagnostic Radiology

## 2018-08-18 DIAGNOSIS — R633 Feeding difficulties, unspecified: Secondary | ICD-10-CM

## 2018-08-18 DIAGNOSIS — K9413 Enterostomy malfunction: Secondary | ICD-10-CM | POA: Diagnosis not present

## 2018-08-18 DIAGNOSIS — Z431 Encounter for attention to gastrostomy: Secondary | ICD-10-CM | POA: Diagnosis not present

## 2018-08-18 HISTORY — PX: IR GJ TUBE CHANGE: IMG1440

## 2018-08-18 MED ORDER — IOPAMIDOL (ISOVUE-300) INJECTION 61%
50.0000 mL | Freq: Once | INTRAVENOUS | Status: AC | PRN
  Administered 2018-08-18: 30 mL

## 2018-08-18 MED ORDER — IOPAMIDOL (ISOVUE-300) INJECTION 61%
INTRAVENOUS | Status: AC
  Administered 2018-08-18: 30 mL
  Filled 2018-08-18: qty 50

## 2018-08-18 NOTE — Procedures (Signed)
Successful exchange of 22 Fr GJ feeding tube.  Tip in jejunum and ready to use.  Balloon inflated with 10 ml of saline.  No blood loss and no immediate complication.

## 2018-08-24 IMAGING — XA IR REPLACE G/J TUBE W/ FLUORO
3 series · 13 of 13 positions shown · non-contrast
Comparison: none

INDICATION: OCCLUDED FEEDING TUBE

[Series 3: fl - angio · 4 of 98 frames shown (1 of 2)]
[frame 10/98]
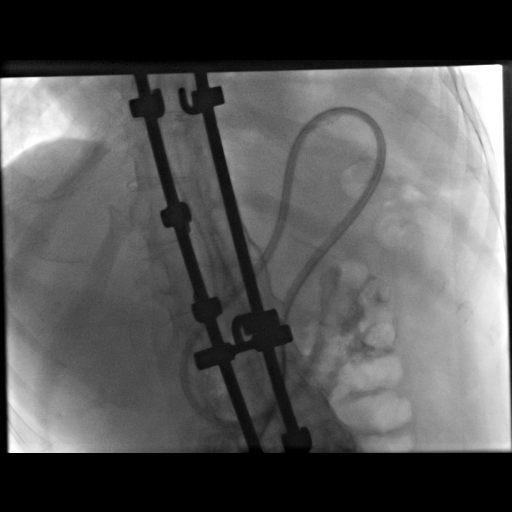
[frame 15/98]
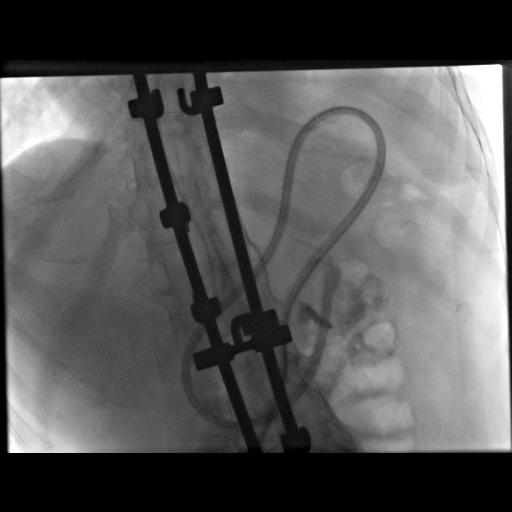
[frame 50/98]
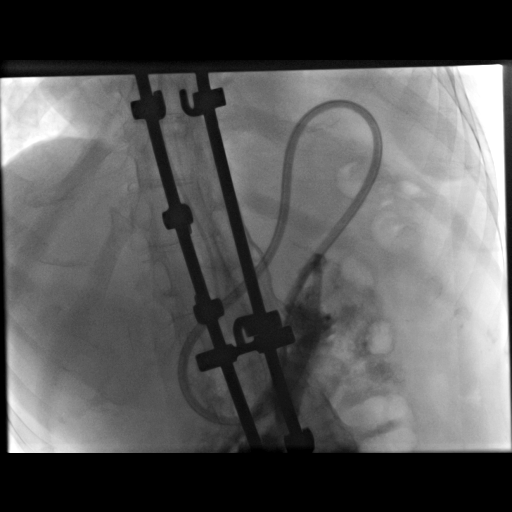
[frame 84/98]
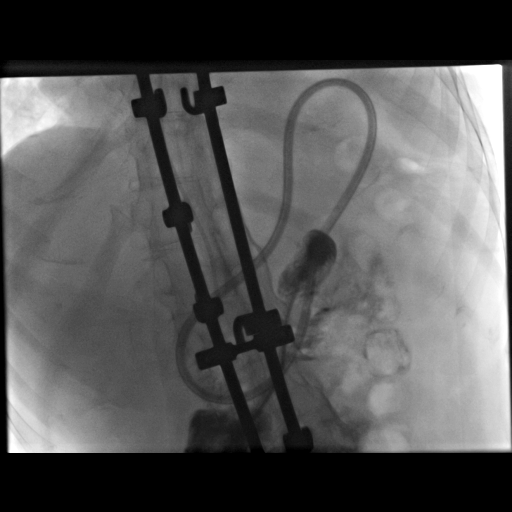

[Series 5: fl - angio · 4 of 78 frames shown (2 of 2)]
[frame 12/78]
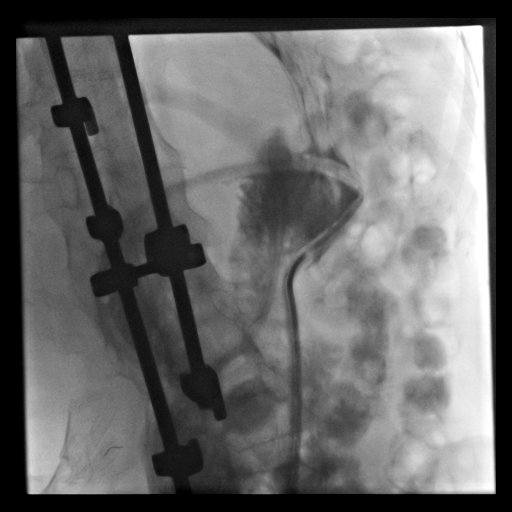
[frame 21/78]
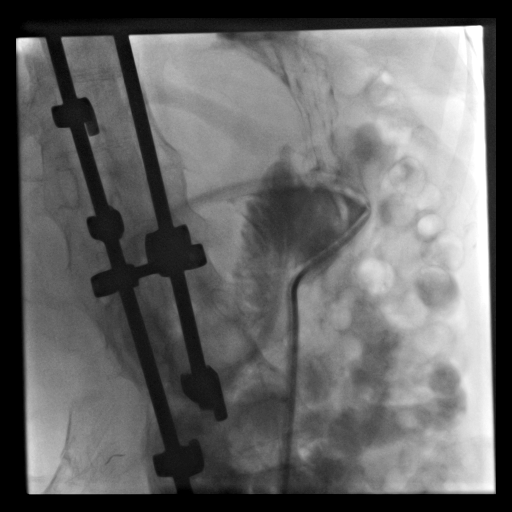
[frame 40/78]
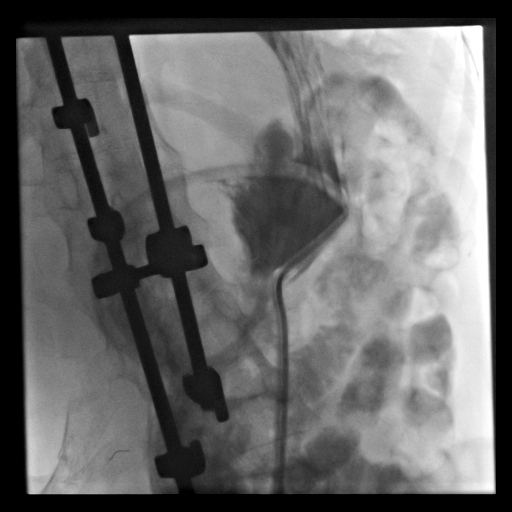
[frame 67/78]
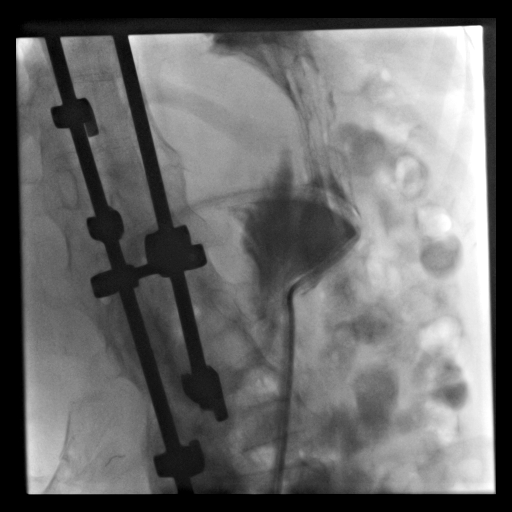

[Series 300: tube placements · 5 of 5 slices shown]
[im 1/5]
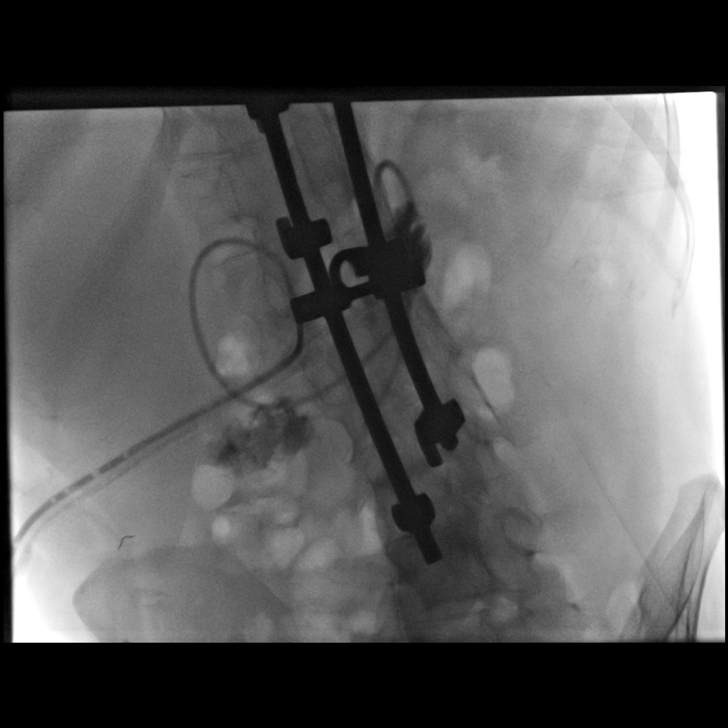
[im 2/5]
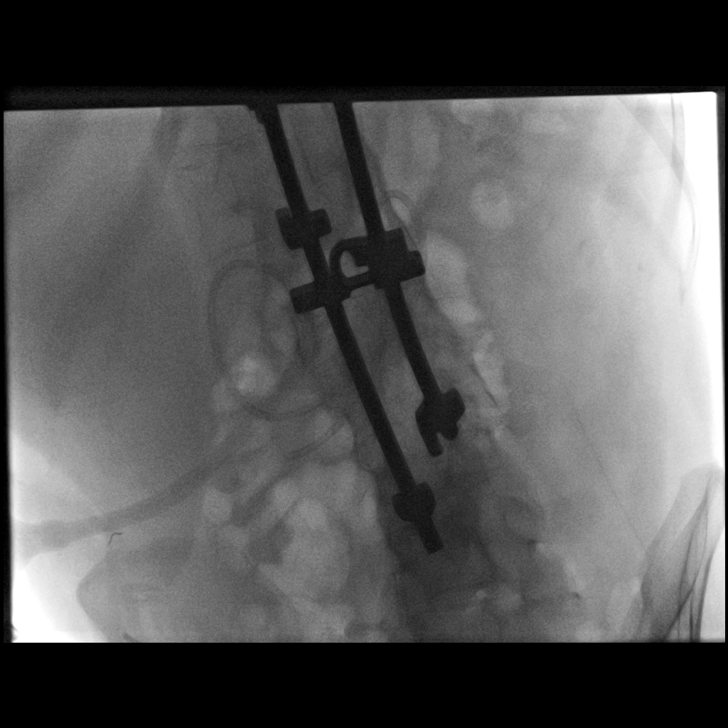
[im 3/5]
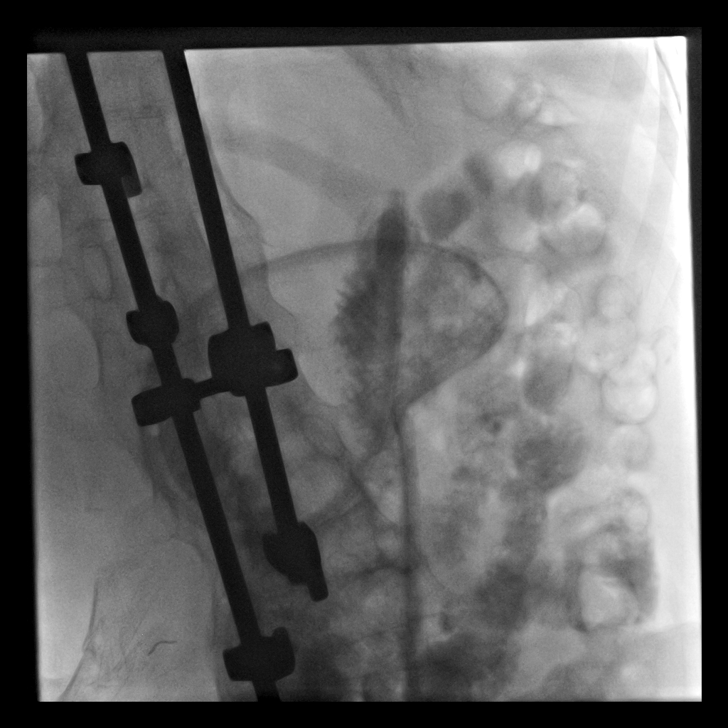
[im 4/5]
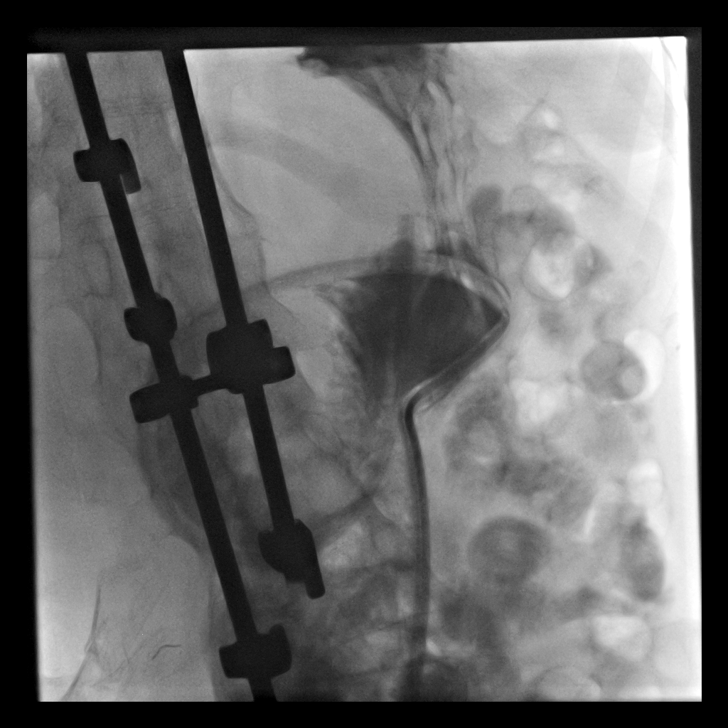
[im 5/5]
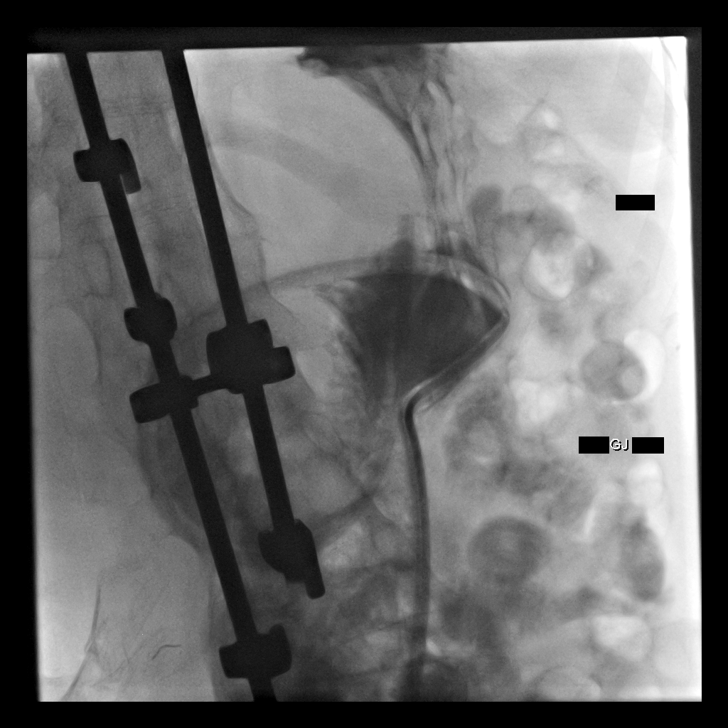

[13 of 13 positions shown; findings below may reference images not displayed]

EXAM:
Fluoroscopic exchange of the 22 French gastrojejunostomy

MEDICATIONS:
None.

ANESTHESIA/SEDATION:
None.

CONTRAST:  50 cc Xsovue-PFF-administered into the gastric lumen.

FLUOROSCOPY TIME:  Fluoroscopy Time: 6 minutes 30 seconds (37 mGy).

COMPLICATIONS:
None immediate.

PROCEDURE:
Informed written consent was obtained from the patient's family
after a thorough discussion of the procedural risks, benefits and
alternatives. All questions were addressed. Maximal Sterile Barrier
Technique was utilized including caps, mask, sterile gowns, sterile
gloves, sterile drape, hand hygiene and skin antiseptic. A timeout
was performed prior to the initiation of the procedure.

Under sterile conditions, the existing gastrojejunostomy was cut and
removed over a stiff Glidewire. A new 22 French jejunostomy was
advanced with the distal aspect at the proximal jejunum. Contrast
injection confirms position of the gastric and jejunal ports. Images
obtained for documentation. Retention balloon inflated with 7 cc
saline and retracted against anterior gastric wall. No immediate
complication. Patient tolerated the procedure well.
IMPRESSION: Successful fluoroscopic replacement of the 22 French
gastrojejunostomy. Ready for use.

## 2018-08-27 IMAGING — DX DG CHEST 2V
2 series · 2 of 2 positions shown · non-contrast
Comparison: 09/02/2016

CLINICAL DATA: Pt with chest pain. Pt has infection from feeding
tube replacement; home nurse aide thought she heard "sounds" in his
lungs; pt has persistent cough; no fever; no h/o asthma; pt is
wheelchair bound

EXAM:
CHEST - 2 VIEW

[chest lat]
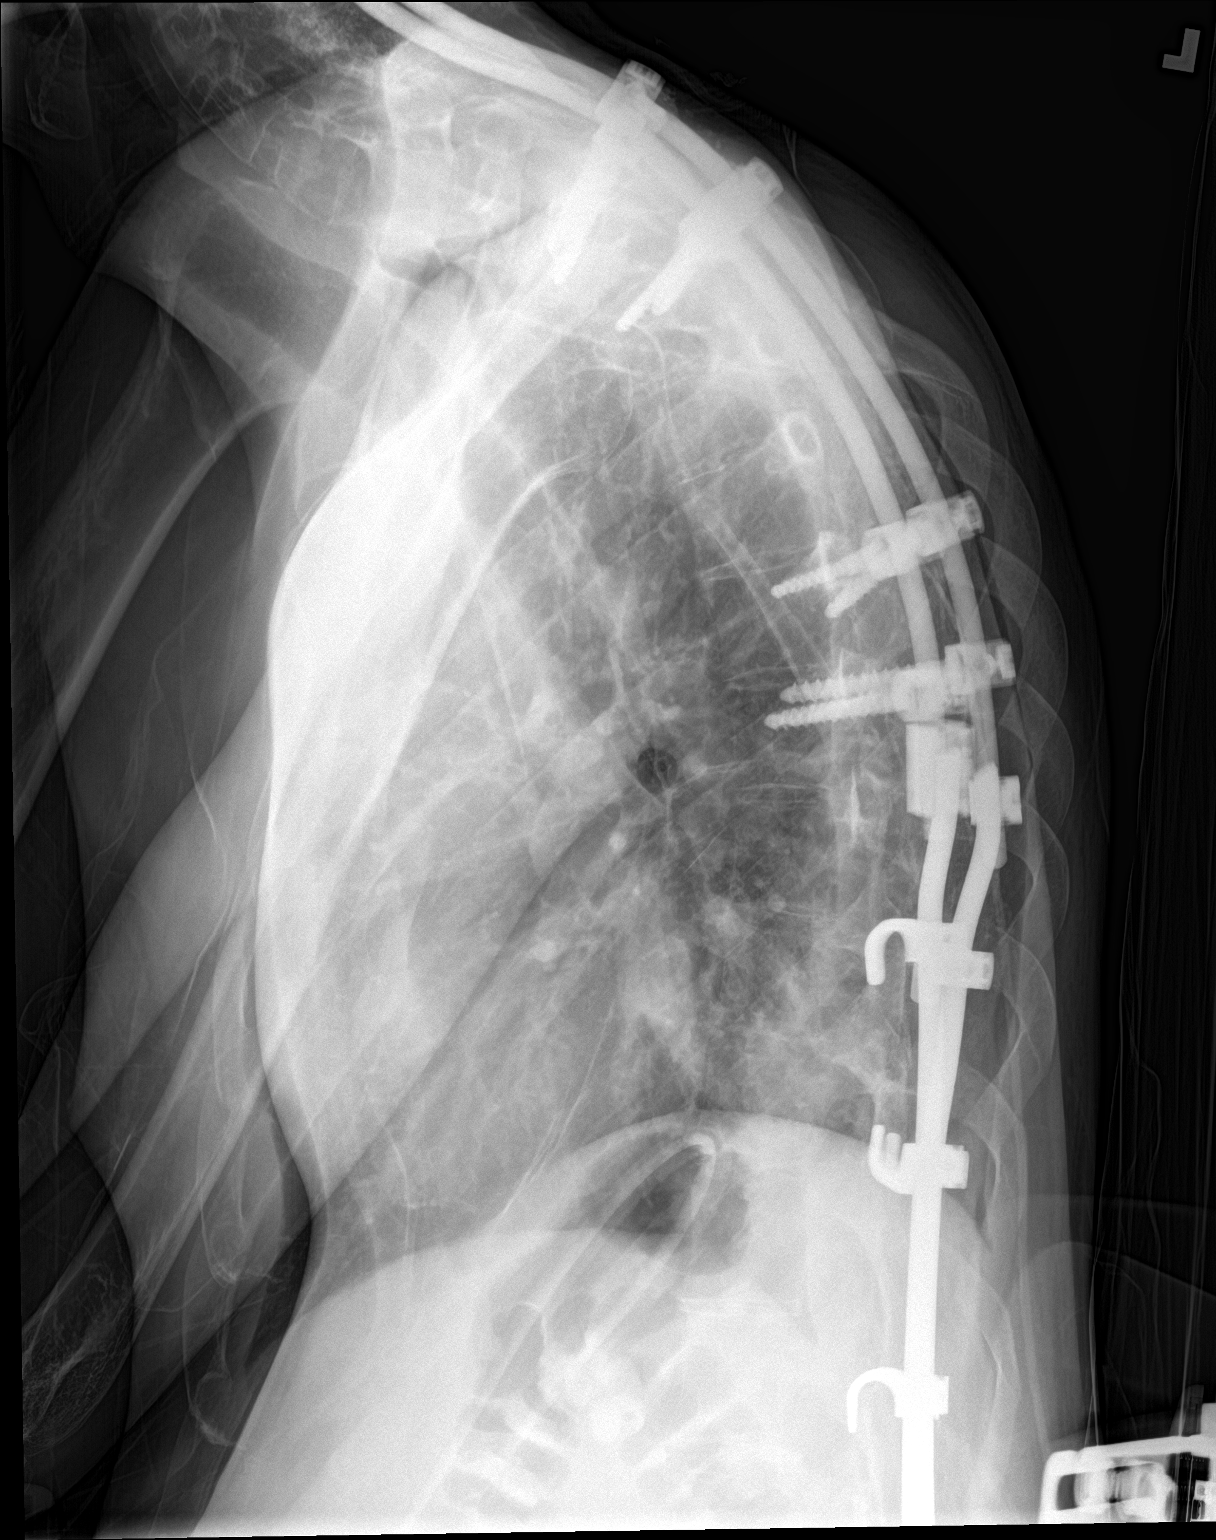

[chest ap]
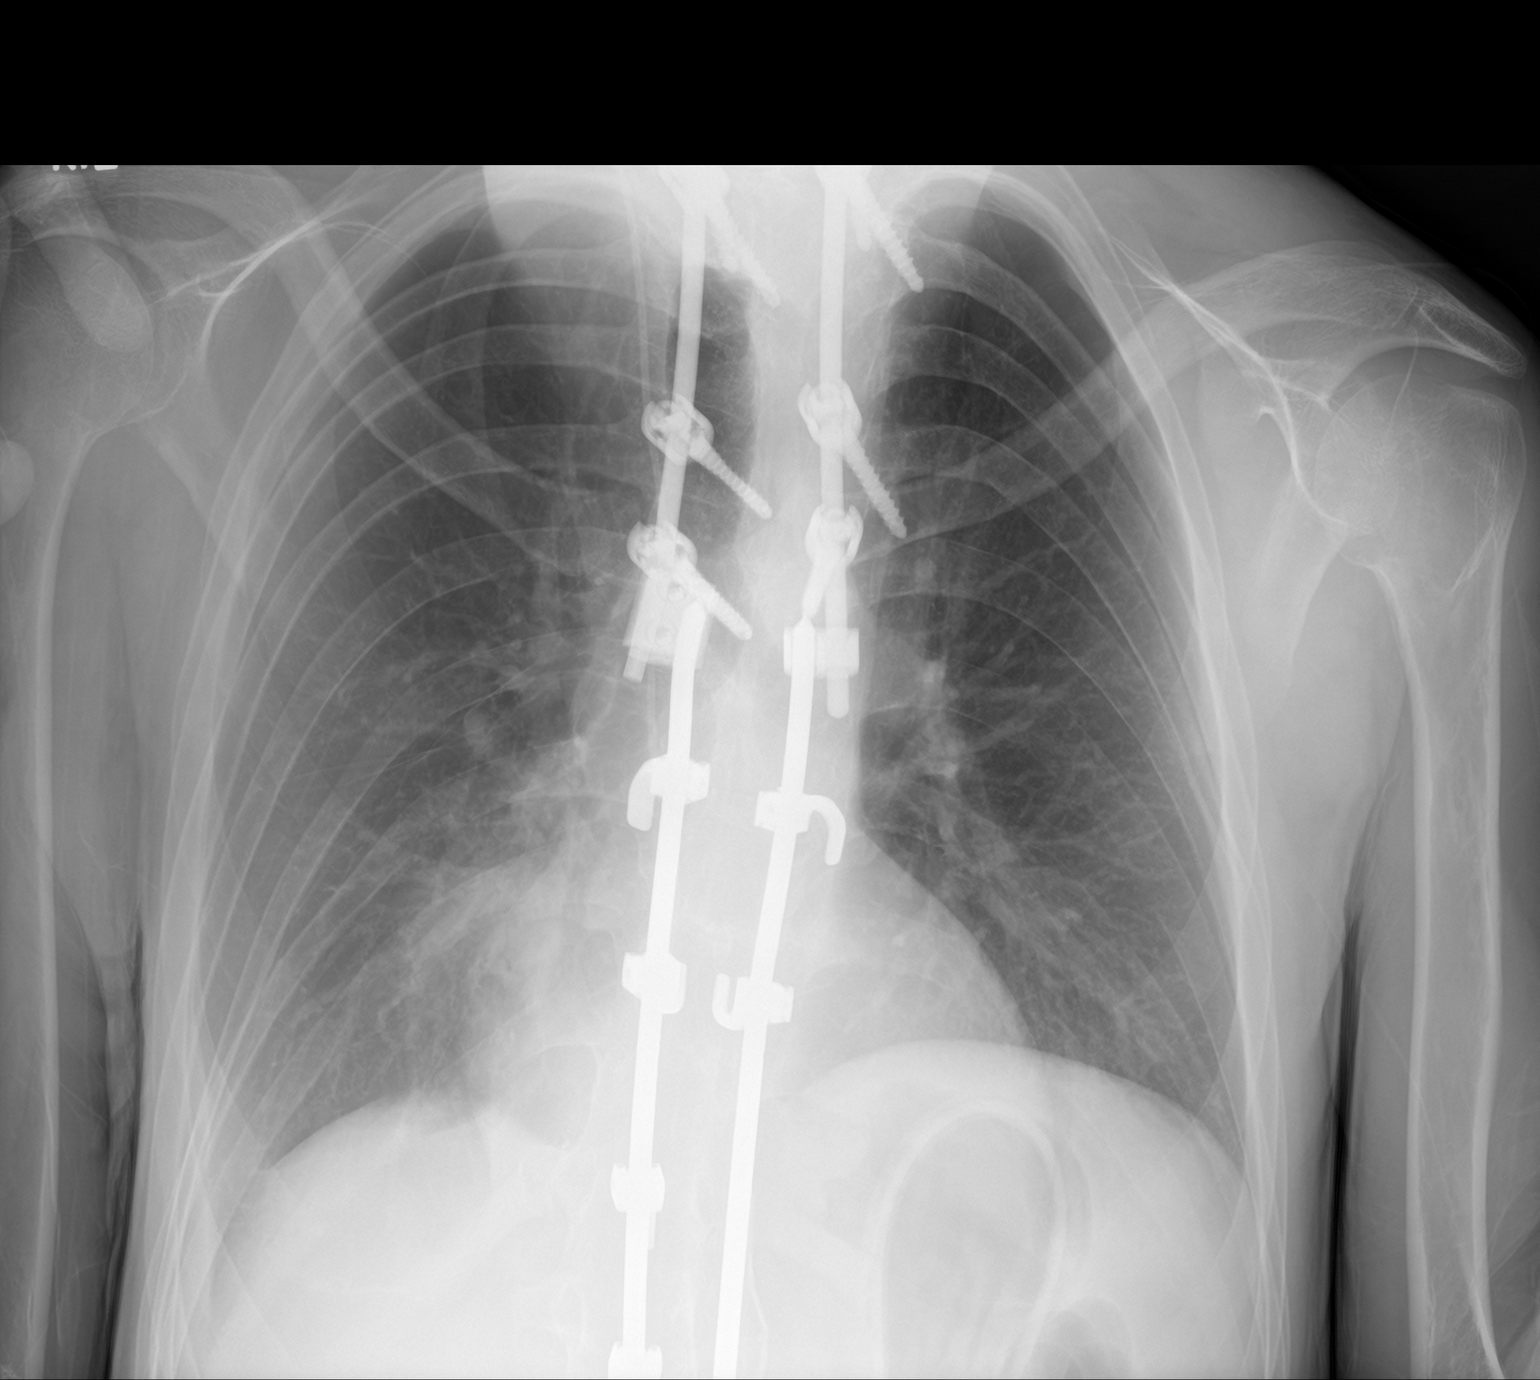

[2 of 2 positions shown; findings below may reference images not displayed]

FINDINGS: There is a trace right pleural effusion. There is no focal
consolidation. There is no pleural effusion or pneumothorax. The
heart and mediastinal contours are unremarkable.

There is posterior spinal fusion hardware.
IMPRESSION: Trace right pleural effusion.

## 2018-08-30 DIAGNOSIS — F339 Major depressive disorder, recurrent, unspecified: Secondary | ICD-10-CM | POA: Diagnosis not present

## 2018-09-01 ENCOUNTER — Other Ambulatory Visit (HOSPITAL_COMMUNITY): Payer: Self-pay | Admitting: Radiology

## 2018-09-01 DIAGNOSIS — R633 Feeding difficulties, unspecified: Secondary | ICD-10-CM

## 2018-09-02 ENCOUNTER — Other Ambulatory Visit (HOSPITAL_COMMUNITY): Payer: Self-pay | Admitting: Radiology

## 2018-09-02 ENCOUNTER — Encounter (HOSPITAL_COMMUNITY): Payer: Self-pay | Admitting: Radiology

## 2018-09-02 ENCOUNTER — Ambulatory Visit (HOSPITAL_COMMUNITY)
Admission: RE | Admit: 2018-09-02 | Discharge: 2018-09-02 | Disposition: A | Discharge status: Home / Self Care | Payer: Medicare Other | Source: Ambulatory Visit | Attending: Radiology | Admitting: Radiology

## 2018-09-02 DIAGNOSIS — R633 Feeding difficulties, unspecified: Secondary | ICD-10-CM

## 2018-09-02 DIAGNOSIS — K9423 Gastrostomy malfunction: Secondary | ICD-10-CM | POA: Diagnosis not present

## 2018-09-02 DIAGNOSIS — K9420 Gastrostomy complication, unspecified: Secondary | ICD-10-CM | POA: Insufficient documentation

## 2018-09-02 HISTORY — PX: IR MECH REMOV OBSTRUC MAT ANY COLON TUBE W/FLUORO: IMG2335

## 2018-09-02 NOTE — Procedures (Signed)
Patient's existing 22 [place] GJ tube was successfully unclogged utilizing blue declogger cath.  Tube was then flushed without difficulty.  No immediate complications.

## 2018-09-06 ENCOUNTER — Encounter: Payer: Self-pay | Admitting: Physical Medicine & Rehabilitation

## 2018-09-06 ENCOUNTER — Encounter: Payer: Medicare Other | Attending: Physical Medicine & Rehabilitation | Admitting: Physical Medicine & Rehabilitation

## 2018-09-06 VITALS — BP 123/69 | HR 86 | Ht 61.0 in | Wt 103.0 lb

## 2018-09-06 DIAGNOSIS — G8250 Quadriplegia, unspecified: Secondary | ICD-10-CM | POA: Diagnosis not present

## 2018-09-06 DIAGNOSIS — G808 Other cerebral palsy: Secondary | ICD-10-CM

## 2018-09-06 NOTE — Progress Notes (Signed)
Botox Injection for spasticity using needle EMG guidance Indication: Spastic tetraplegia (HCC)   Dilution: 100 Units/ml        Total Units Injected: 600 Indication: Severe spasticity which interferes with ADL,mobility and/or  hygiene and is unresponsive to medication management and other conservative care Informed consent was obtained after describing risks and benefits of the procedure with the patient. This includes bleeding, bruising, infection, excessive weakness, or medication side effects. A REMS form is on file and signed.  Needle: 38mm injectable monopolar needle electrode  Number of units per muscle Pectoralis Major 0 units Pectoralis Minor 0 units Biceps 50 units Brachioradialis 25 units FCR 10 units FCU 10 units FDS 65 units FDP 65 units FPL 0 units Pronator Teres 0 units Pronator Quadratus 0 units Lumbricals 75 u  Right Number of units per muscle Pectoralis Major 0 units Pectoralis Minor 0 units Biceps 50 units Brachioradialis 25 units FCR 10 units FCU 10 units FDS 65 units FDP 65 units FPL 0 units Pronator Teres 0 units Pronator Quadratus 0 units Lumbricals 75u  All injections were done after obtaining appropriate EMG activity and after negative drawback for blood. The patient tolerated the procedure well. Post procedure instructions were given. Return in about 3 months (around 12/07/2018).

## 2018-09-06 NOTE — Patient Instructions (Signed)
PLEASE FEEL FREE TO CALL OUR OFFICE WITH ANY PROBLEMS OR QUESTIONS ([phone])      

## 2018-10-07 ENCOUNTER — Telehealth: Payer: Self-pay | Admitting: *Deleted

## 2018-10-07 NOTE — Telephone Encounter (Signed)
Received Physician Orders from [facility]; forwarded to provider/SLS 12/06

## 2018-10-17 ENCOUNTER — Other Ambulatory Visit (HOSPITAL_COMMUNITY): Payer: Self-pay | Admitting: Diagnostic Radiology

## 2018-10-17 ENCOUNTER — Encounter (HOSPITAL_COMMUNITY): Payer: Self-pay | Admitting: Diagnostic Radiology

## 2018-10-17 ENCOUNTER — Ambulatory Visit (HOSPITAL_COMMUNITY)
Admission: RE | Admit: 2018-10-17 | Discharge: 2018-10-17 | Disposition: A | Discharge status: Home / Self Care | Payer: Medicare Other | Source: Ambulatory Visit | Attending: Diagnostic Radiology | Admitting: Diagnostic Radiology

## 2018-10-17 DIAGNOSIS — Y733 Surgical instruments, materials and gastroenterology and urology devices (including sutures) associated with adverse incidents: Secondary | ICD-10-CM | POA: Insufficient documentation

## 2018-10-17 DIAGNOSIS — R633 Feeding difficulties, unspecified: Secondary | ICD-10-CM

## 2018-10-17 DIAGNOSIS — G809 Cerebral palsy, unspecified: Secondary | ICD-10-CM | POA: Diagnosis not present

## 2018-10-17 DIAGNOSIS — K9423 Gastrostomy malfunction: Secondary | ICD-10-CM | POA: Diagnosis not present

## 2018-10-17 DIAGNOSIS — K9413 Enterostomy malfunction: Secondary | ICD-10-CM | POA: Diagnosis not present

## 2018-10-17 HISTORY — PX: IR REPLC GASTRO/COLONIC TUBE PERCUT W/FLUORO: IMG2333

## 2018-10-17 MED ORDER — LIDOCAINE VISCOUS HCL 2 % MT SOLN
OROMUCOSAL | Status: AC
  Filled 2018-10-17: qty 15

## 2018-10-17 MED ORDER — IOPAMIDOL (ISOVUE-300) INJECTION 61%
INTRAVENOUS | Status: AC
  Administered 2018-10-17: 15 mL
  Filled 2018-10-17: qty 50

## 2018-10-17 NOTE — Procedures (Signed)
Interventional Radiology Procedure:   Indications: Clogged GJ tube  Procedure: Exchange of [name] feeding tube  Findings: Tip in jejunum  Complications: None     EBL: Minimal  Plan: GJ is ready for use.     [name] R. Anselm Pancoast, MD  Pager: [phone]

## 2018-10-18 ENCOUNTER — Other Ambulatory Visit: Payer: Self-pay | Admitting: Physical Medicine & Rehabilitation

## 2018-10-18 DIAGNOSIS — G809 Cerebral palsy, unspecified: Secondary | ICD-10-CM

## 2018-10-18 DIAGNOSIS — G8250 Quadriplegia, unspecified: Secondary | ICD-10-CM

## 2018-11-25 IMAGING — XA IR REPLACE G/J TUBE W/ FLUORO
4 series · 12 of 15 positions shown · non-contrast
Comparison: none

INDICATION: 32-year-old with cerebral palsy and chronic GJ feeding tube. Patient
has been recently coughing up the enteral feeds. Concern for tube
malpositioning.

[Series 2: fl - angio · 3 of 31 frames shown (1 of 3)]
[frame 5/31]
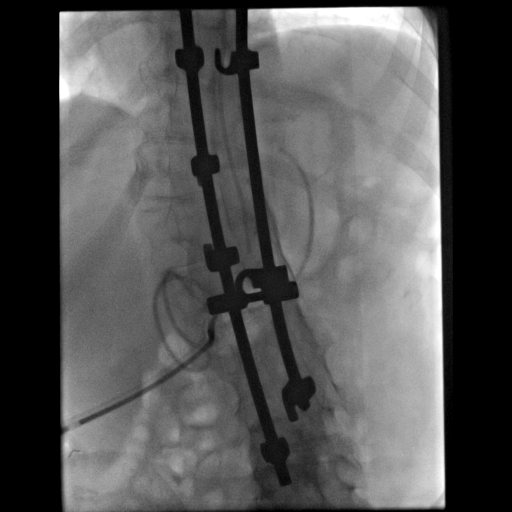
[frame 16/31]
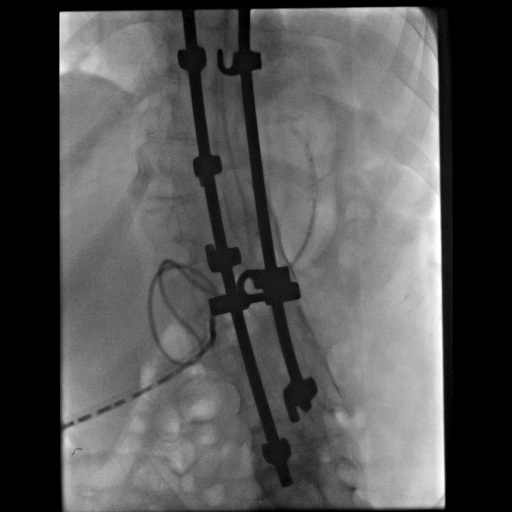
[frame 27/31]
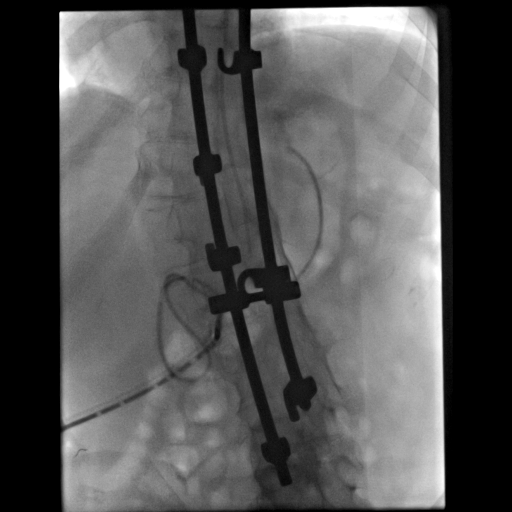

[Series 3: fl - angio · 3 of 30 frames shown (2 of 3)]
[frame 5/30]
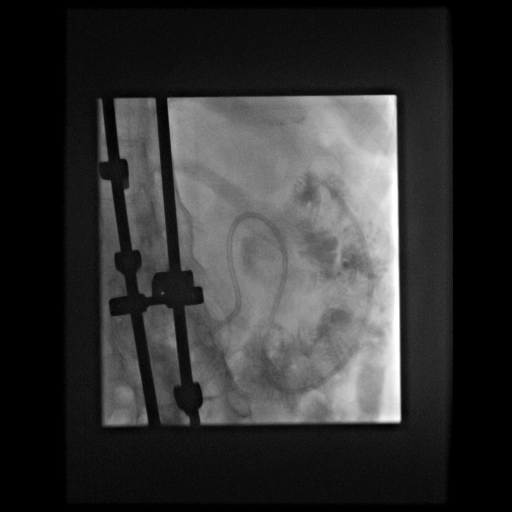
[frame 6/30]
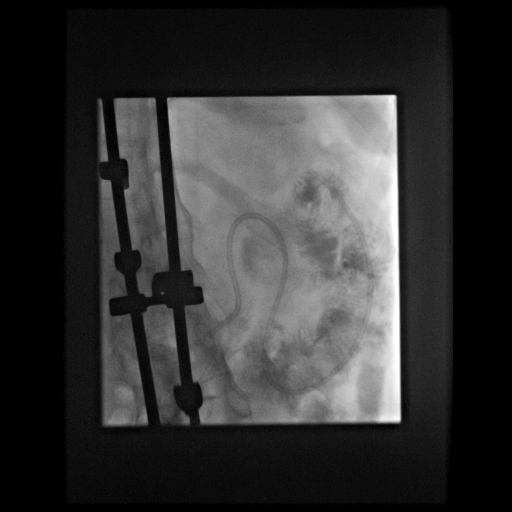
[frame 16/30]
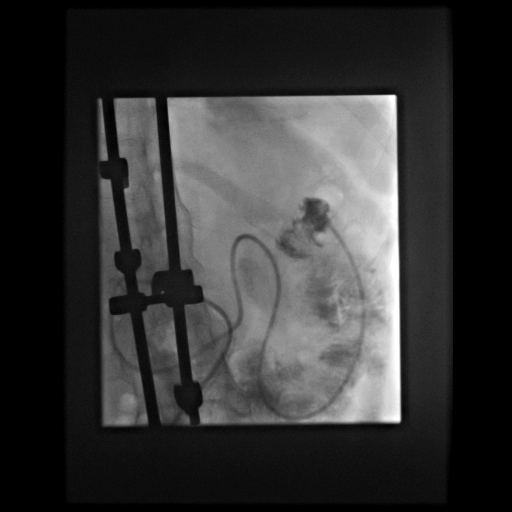

[Series 4: fl - angio · 4 of 100 frames shown (3 of 3)]
[frame 16/100]
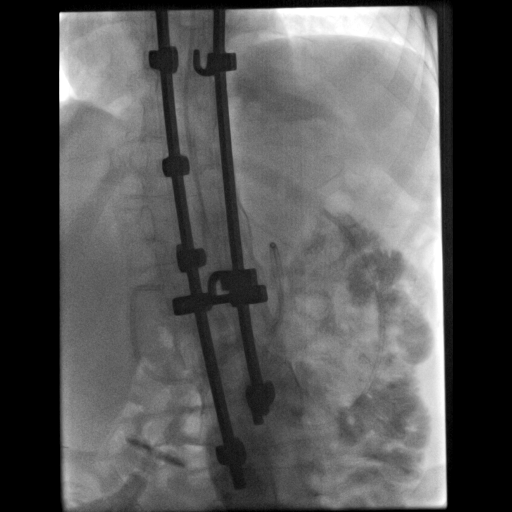
[frame 35/100]
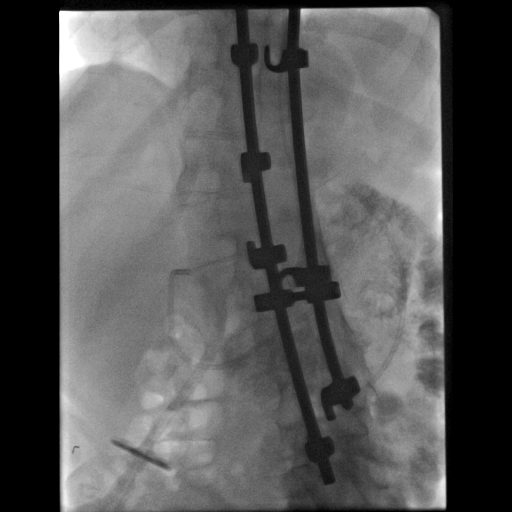
[frame 51/100]
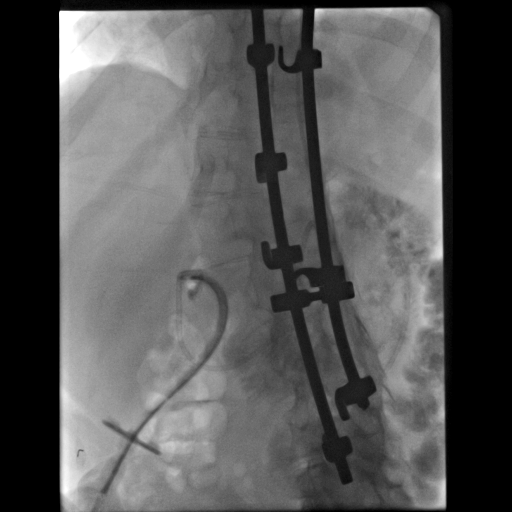
[frame 86/100]
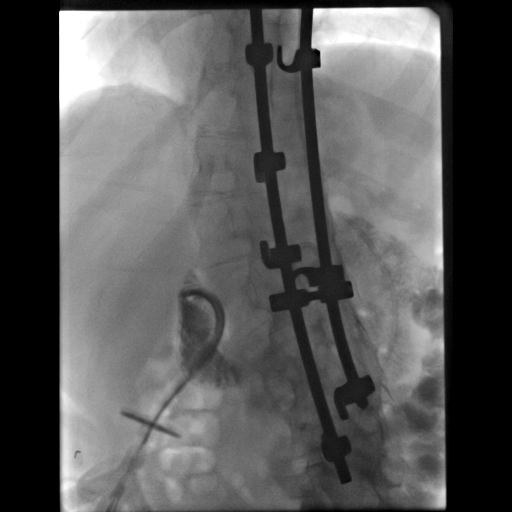

[Series 300: tube placements · 2 of 3 slices shown]
[im 2/3]
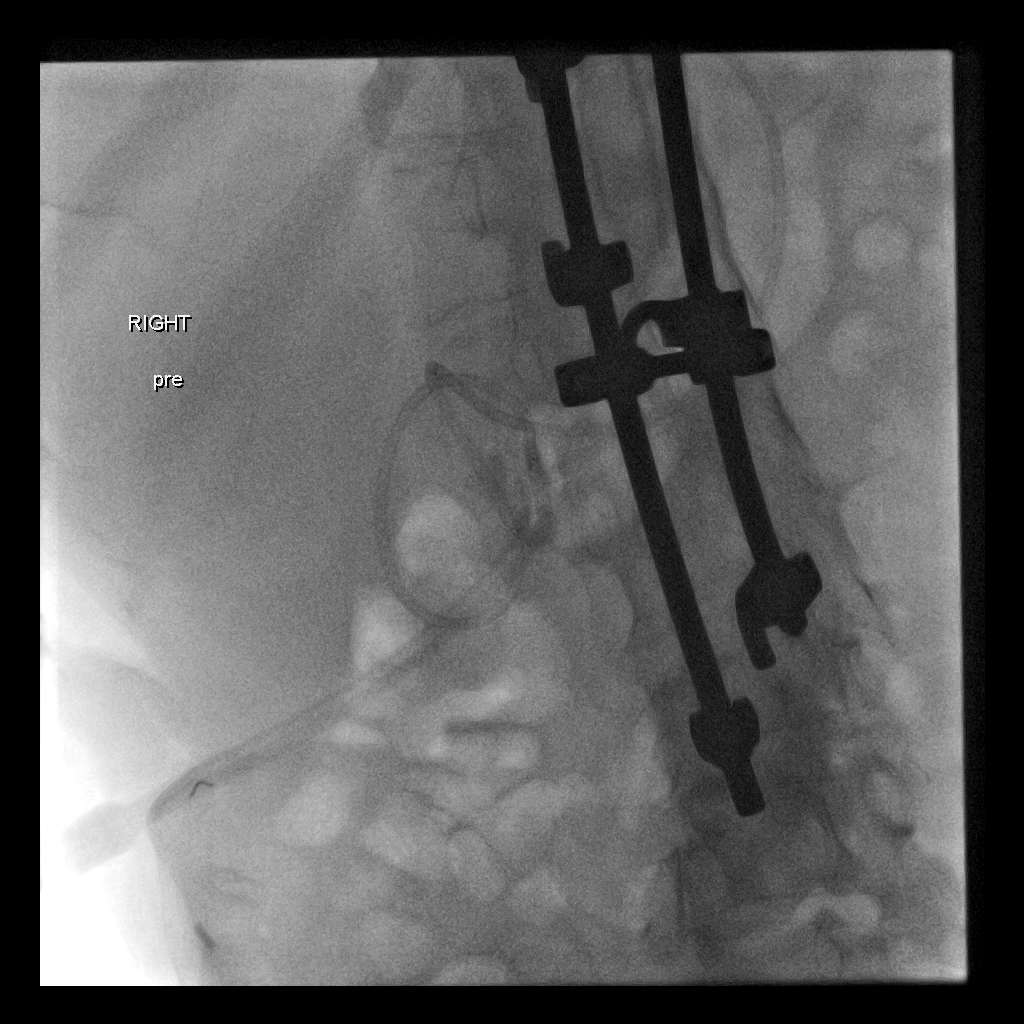
[im 3/3]
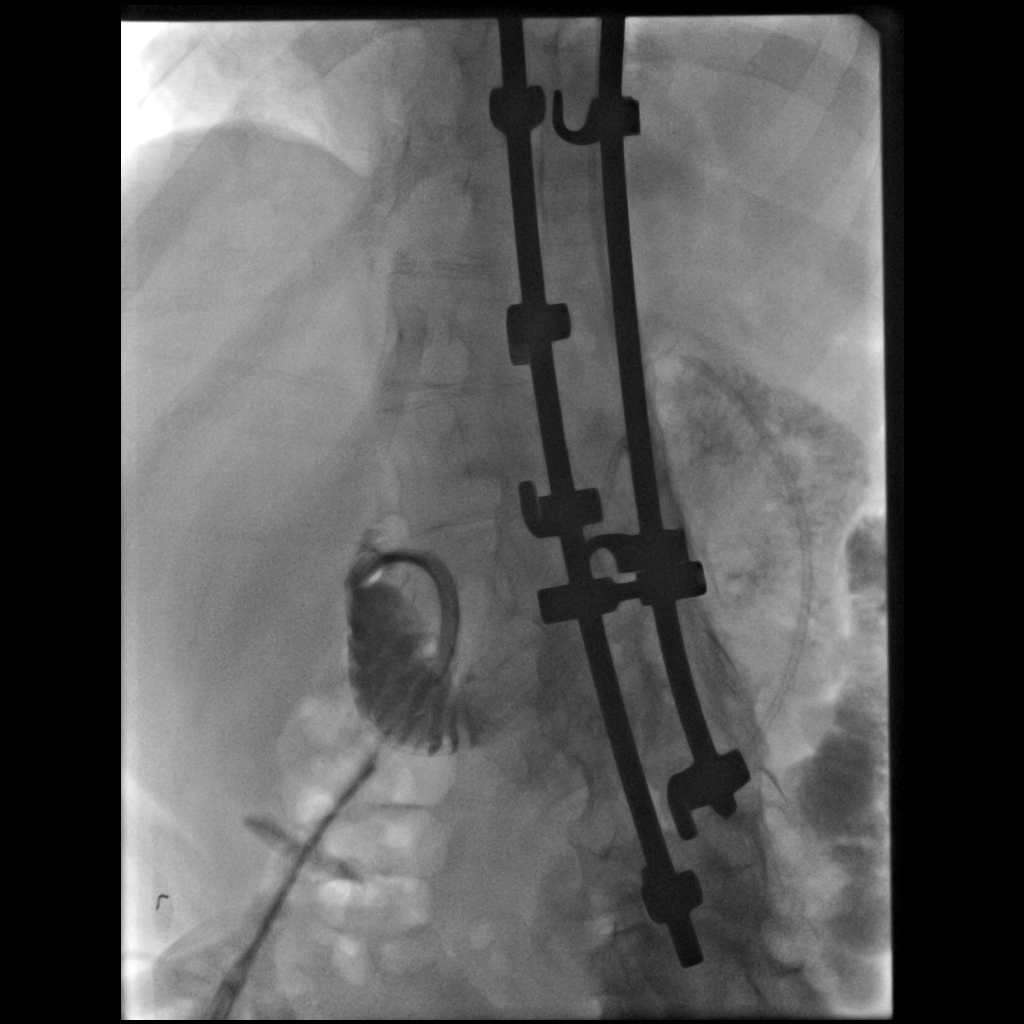

[12 of 15 positions shown; findings below may reference images not displayed]

EXAM:
GASTROJEJUNOSTOMY TUBE EXCHANGE WITH FLUOROSCOPY

MEDICATIONS:
None

ANESTHESIA/SEDATION:
None

CONTRAST:  25 mL Isovue 300-administered into the gastric lumen.

FLUOROSCOPY TIME:  Fluoroscopy Time: 5 minutes 42 seconds (39 mGy).

COMPLICATIONS:
None immediate.

PROCEDURE:
Maximal Sterile Barrier Technique was utilized including caps, mask,
sterile gowns, sterile gloves, sterile drape, hand hygiene and skin
antiseptic. A timeout was performed prior to the initiation of the
procedure.

The existing tube and surrounding skin was prepped and draped in
sterile fashion. Fluoroscopy and tube injection confirmed that the
tip was now in the stomach and actually pointing up towards the
fundus. The retention balloon was deflated and this tube was
completely removed. Following removal of the tube, a large amount
yellow tube feeds came out of the gastrostomy tube hole. A C2
catheter was used to cannulate the stomach and duodenum. A Glidewire
was advanced into the jejunum. Catheter was advanced over the wire.
Catheter was removed over a stiff Glidewire. A 22 French GJ feeding
tube was selected. The weighted tip was cut off due to problems with
this component in the past. The new tube was easily advanced over
the wire and the tip was positioned in the jejunum. Contrast
injection confirmed placement in the small bowel. Jejunal lumen was
flushed with saline. The balloon was inflated with approximately 8
mL of saline. The gastric lumen was also injected and confirmed
appropriate positioning. The gastric lumen was then flushed with
saline.

Fluoroscopic images were taken and saved for this procedure.
FINDINGS: The old GJ tube tip was within the stomach. The new GJ tube tip is
in the jejunum.
IMPRESSION: Successful replacement of the GJ feeding tube. The tip of the
catheter is within the jejunum. Catheter is ready to be used.

## 2018-11-29 ENCOUNTER — Telehealth: Payer: Self-pay | Admitting: *Deleted

## 2018-11-29 NOTE — Telephone Encounter (Signed)
Received Physician Orders from [place] [facility], LDN; forwarded to provider/SLS 01/28

## 2018-12-01 ENCOUNTER — Ambulatory Visit (INDEPENDENT_AMBULATORY_CARE_PROVIDER_SITE_OTHER): Payer: Medicare Other | Admitting: Medical

## 2018-12-01 ENCOUNTER — Ambulatory Visit (HOSPITAL_BASED_OUTPATIENT_CLINIC_OR_DEPARTMENT_OTHER)
Admission: RE | Admit: 2018-12-01 | Discharge: 2018-12-01 | Disposition: A | Discharge status: Home / Self Care | Payer: Medicare Other | Source: Ambulatory Visit | Attending: Medical | Admitting: Medical

## 2018-12-01 VITALS — BP 119/87 | HR 96 | Temp 97.8°F | Resp 16

## 2018-12-01 DIAGNOSIS — J40 Bronchitis, not specified as acute or chronic: Secondary | ICD-10-CM | POA: Insufficient documentation

## 2018-12-01 DIAGNOSIS — R05 Cough: Secondary | ICD-10-CM | POA: Insufficient documentation

## 2018-12-01 DIAGNOSIS — R509 Fever, unspecified: Secondary | ICD-10-CM | POA: Diagnosis not present

## 2018-12-01 DIAGNOSIS — R059 Cough, unspecified: Secondary | ICD-10-CM

## 2018-12-01 MED ORDER — NYSTATIN 100000 UNIT/ML MT SUSP: OROMUCOSAL | 0 refills | Status: DC

## 2018-12-01 MED ORDER — AZITHROMYCIN 200 MG/5ML PO SUSR
ORAL | 0 refills | Status: DC
Start: 2018-12-01 — End: 2019-04-12

## 2018-12-01 MED ORDER — HYDROCODONE-HOMATROPINE 5-1.5 MG/5ML PO SYRP: 5.0000 mL | ORAL_SOLUTION | Freq: Four times a day (QID) | ORAL | 0 refills | Status: DC | PRN

## 2018-12-01 NOTE — Progress Notes (Signed)
Subjective:    Patient ID: [name], male    DOB: May 24, 1986, 33 y.o.   MRN: [id number]  HPI  Pt in cough and chest congestion.  2 of patients health aids were sick with nasal congestion.  Saturday, Sunday and Monday.  Had fever 100-102.6. No fever since Tuesday. His fever broke Monday night. Coughed a lot Tuesday and Wednesday. Then last night he got recurrent fever was 101 yesterday at 2.  Pt has done well in past with tussionex in past. No side effect with hydrocodone.  Cough is productive.  Pt did have flu vaccine this year.(last week one of caregiver)    Review of Systems  Constitutional: Positive for fever. Negative for chills and fatigue.  Respiratory: Positive for cough. Negative for chest tightness, shortness of breath and wheezing.   Cardiovascular: Negative for chest pain and palpitations.  Gastrointestinal: Negative for abdominal pain.  Musculoskeletal: Negative for back pain, gait problem, myalgias and neck stiffness.  Skin: Negative for rash.  Neurological: Negative for dizziness, weakness, light-headedness and headaches.  Hematological: Negative for adenopathy. Does not bruise/bleed easily.  Psychiatric/Behavioral: Negative for behavioral problems, confusion, sleep disturbance and suicidal ideas. The patient is not nervous/anxious.      Past Medical History:  Diagnosis Date  . Cerebral palsy ([place])   . Dehydration 11/22/2013  . Depression with anxiety 08/01/2010   Qualifier: Diagnosis of  By: Nelson-Smith CMA (AAMA), Dottie    . Dyslipidemia 08/19/2017  . Esophagitis 2011  . 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 GERD (gastroesophageal reflux disease)   . Hyperlipidemia, mild 08/25/2015  . Hyperthyroidism   . Incontinence of feces   . Loss of weight 08/28/2014  . Medicare annual wellness visit, subsequent 08/25/2015  . Mildly underweight adult 03/16/2017  . Palpitations   . Skin lesion of right ear 03/16/2017  . Thyroid disease 08/01/2010   Qualifier: Diagnosis of  By: Harlon Ditty CMA (AAMA), Dottie       Social History   Socioeconomic History  . Marital status: Single    Spouse name: Not on file  . Number of children: 0  . Years of education: Not on file  . Highest education level: Not on file  Occupational History  . Occupation: disbaled  Social Needs  . Financial resource strain: Not on file  . Food insecurity:    Worry: Not on file    Inability: Not on file  . Transportation needs:    Medical: Not on file    Non-medical: Not on file  Tobacco Use  . Smoking status: Never Smoker  . Smokeless tobacco: Never Used  Substance and Sexual Activity  . Alcohol use: No  . Drug use: No  . Sexual activity: Never  Lifestyle  . Physical activity:    Days per week: Not on file    Minutes per session: Not on file  . Stress: Not on file  Relationships  . Social connections:    Talks on phone: Not on file    Gets together: Not on file    Attends religious service: Not on file    Active member of club or organization: Not on file    Attends meetings of clubs or organizations: Not on file    Relationship status: Not on file  . Intimate partner violence:    Fear of current or ex partner: Not on file    Emotionally abused: Not on file    Physically abused: Not on file    Forced sexual  activity: Not on file  Other Topics Concern  . Not on file  Social History Narrative  . Not on file    Past Surgical History:  Procedure Laterality Date  . baclofen trial    . baslofen pump implant    . ears tubes    . EYE SURGERY    . FLEXIBLE SIGMOIDOSCOPY N/A 09/07/2014   Procedure: FLEXIBLE SIGMOIDOSCOPY;  Surgeon: [name], MD;  Location: [facility] ENDOSCOPY;  Service: Endoscopy;  Laterality: N/A;  . g-tube insert  August 2006  . hamstring released     to treat contractures.   Marland Kitchen HIP SURGERY     x2 , side   . IR CM INJ ANY COLONIC TUBE W/FLUORO  05/28/2017  . IR GENERIC HISTORICAL  07/01/2016   IR GASTR TUBE CONVERT GASTR-JEJ  PER W/FL MOD SED 07/01/2016 [name], MD [facility]  . IR GENERIC HISTORICAL  07/08/2016   IR PATIENT EVAL TECH 0-60 MINS 07/08/2016 [name], MD [facility]  . IR GENERIC HISTORICAL  07/14/2016   IR GJ TUBE CHANGE 07/14/2016 [name], MD [facility]  . IR GENERIC HISTORICAL  07/21/2016   IR PATIENT EVAL TECH 0-60 MINS [facility]  . IR GENERIC HISTORICAL  08/31/2016   IR [place] DUODEN/JEJUNO TUBE PERCUT W/FLUORO 08/31/2016 [name], MD [facility]  . IR GENERIC HISTORICAL  09/03/2016   IR GJ TUBE CHANGE 09/03/2016 [name], MD [facility]  . IR GENERIC HISTORICAL  09/10/2016   IR GASTR TUBE CONVERT GASTR-JEJ PER W/FL MOD SED 09/10/2016 [facility]  . IR GENERIC HISTORICAL  09/16/2016   IR PATIENT EVAL TECH 0-60 MINS [facility]  . IR GENERIC HISTORICAL  09/29/2016   IR GJ TUBE CHANGE 09/29/2016 [name], MD [facility]  . IR GJ TUBE CHANGE  02/05/2017  . IR GJ TUBE CHANGE  05/21/2017  . IR GJ TUBE CHANGE  08/25/2017  . IR GJ TUBE CHANGE  01/18/2018  . IR GJ TUBE CHANGE  02/04/2018  . IR GJ TUBE CHANGE  04/21/2018  . IR GJ TUBE CHANGE  08/18/2018  . IR MECH REMOV OBSTRUC MAT ANY COLON TUBE W/FLUORO  09/02/2018  . IR REPLC GASTRO/COLONIC TUBE PERCUT W/FLUORO  10/17/2018  . PEG PLACEMENT  10/21/2011   Procedure: PERCUTANEOUS ENDOSCOPIC GASTROSTOMY (PEG) REPLACEMENT;  Surgeon: [name], MD;  Location: [facility];  Service: Endoscopy;  Laterality: N/A;  . PEG PLACEMENT N/A 06/13/2013   Procedure: PERCUTANEOUS ENDOSCOPIC GASTROSTOMY (PEG) REPLACEMENT;  Surgeon: [name], MD;  Location: [facility];  Service: Endoscopy;  Laterality: N/A;  . SPINAL FUSION    . spinal fusion to correct 70 degree kyphosis  11-2010  . spinal fusioncorrect 106 degree kyphosis    . SPINE SURGERY  ,11/20/2010, 2011   for correction of severe contracturing spinal kyphosis.   . TONSILLECTOMY      Family History  Problem Relation Age of Onset  . Asthma Mother   .  Hyperlipidemia Mother   . COPD Mother   . Other Mother        bronchial stasis/ABPA  . Cancer Maternal Grandmother 68       breast  . Hyperlipidemia Maternal Grandmother   . Hypertension Maternal Grandmother   . Cancer Maternal Grandfather        prostate  . Heart disease Paternal Grandfather        CHF  . Osteoporosis Paternal Grandmother   . Arthritis Paternal Grandmother  rheumatoid    Allergies  Allergen Reactions  . Ambien [Zolpidem Tartrate] Nausea Only  . Antihistamines, Chlorpheniramine-Type     Other reaction(s): Other (See Comments) Other Reaction: agitation  . Codeine Other (See Comments)    Makes patient too active after a few days.  . Metoclopramide Other (See Comments)    Delusion, emotionality   . Baclofen Anxiety  . Pheniramine Rash    Other reaction(s): Other (See Comments) Other Reaction: agitation  . Sulfa Antibiotics Rash  . Sulfonamide Derivatives Rash    Current Outpatient Medications on File Prior to Visit  Medication Sig Dispense Refill  . AMBULATORY NON FORMULARY MEDICATION Medication Name: MIC gastrostomy/bolus feeding tube 24 French Part number 0110-24. #2 and 10 cc lurer lock syringe #2 Dx: 4 Device 2  . bacitracin 500 UNIT/GM ointment Apply 1 application topically 2 (two) times daily. 30 g 1  . clotrimazole-betamethasone (LOTRISONE) cream Apply 1 application topically 2 (two) times daily. 45 g 1  . dantrolene (DANTRIUM) 50 MG capsule TAKE 1 CAPSULE BY MOUTH 3 TIMES A DAY. 90 capsule 4  . divalproex (DEPAKOTE SPRINKLE) 125 MG capsule Take 2 capsules in morning, 5 capsules at bedtime (Patient taking differently: 250-625 mg. Take 2 capsules in morning, 5 capsules at bedtime) 210 capsule 4  . Incontinence Supply Disposable (PREVAIL BREEZERS MEDIUM) MISC pkg of 16- size medium 32" to 44"  Breathable cloth-like outer fabric (can't use the plastic outer surgace  Item # [id number]/[id number] 16 each 6  . LORazepam (ATIVAN) 1 MG tablet Take one tablet  in evenings as needed for insomnia. (Patient taking differently: Take one tablet in evenings for insomnia.) 30 tablet 1  . mupirocin ointment (BACTROBAN) 2 % Apply to area thin film twice daily if needed 22 g 0  . NON FORMULARY Bard Leg Bag Extension tubing w/Connector 18", Sterile, latex-free  Item# [id number]    . NON FORMULARY Colorplast Freedom Cath Latex Self-Adhering Male External Catheter 81mm Diameter Intermediate  Item# [id number]    . NONFORMULARY OR COMPOUNDED ITEM [employer] REF [id number] - [place] Pump Set with Flush Bags - 1000 mL 1 each 0  . Nutritional Supplements (FEEDING SUPPLEMENT, JEVITY 1.5 CAL,) LIQD Run tube feed at 80 mL per hour over 15 hours. (3 cartons) 80 mL 11  . Nutritional Supplements (FEEDING SUPPLEMENT, VITAL 1.5 CAL,) LIQD Place 80 mLs into feeding tube continuous. 2 cartons 80 mL 11  . OLANZapine (ZYPREXA) 2.5 MG tablet Place 5 mg into feeding tube 2 (two) times daily.   3  . Omeprazole-Sodium Bicarbonate (ZEGERID) 20-1100 MG CAPS capsule Take 1 capsule by mouth daily before breakfast.    . Ostomy Supplies (PROTECTIVE BARRIER WIPES) MISC 1-1/4" X 3"  Item #[id number] 75 each 6  . PARoxetine (PAXIL) 10 MG tablet Take 15 mg by mouth daily.    . sucralfate (CARAFATE) 1 g tablet Take 1 tablet (1 g total) by mouth 4 (four) times daily -  with meals and at bedtime. 120 tablet 5   No current facility-administered medications on file prior to visit.     BP 119/87   Pulse 96   Temp 97.8 F (36.6 C) (Axillary)   Resp 16   SpO2 96%        Objective:   Physical Exam  General- No acute distress. Pleasant patient. But coughing intermittently during the exam. Mouth- mild beefy red appearance to tongue. Dry white dc. Neck- Full range of motion, no jvd Lungs- Clear, even and unlabored.(But shallow breathing on  exam). Not labored. Heart- regular rate and rhythm. Neurologic- CNII- XII grossly intact.      Assessment & Plan:  You do appear to have bronchitis type  symptoms recently following potential exposure to caretakers who were sick with viral syndrome possible flu versus URI.  Symptoms present for about 5 days.  You had flu vaccine this year and in theory if you were exposed to flu already passed the window treatment timeframe.  Will get CBC today and chest x-ray.  I did prescribe Hycodan cough syrup.  Hopefully you will be able to sleep with this.  Also prescribing a azithromycin antibiotic.  Will follow chest x-ray and lab results.  If chest x-ray shows pneumonia then will add Augmentin.  Follow-up in 7 days or as needed.  Mycostatin rx written to start in event early thrush. He will be on antibiotics.  Mackie Pai, PA-C

## 2018-12-01 NOTE — Patient Instructions (Signed)
You do appear to have bronchitis type symptoms recently following potential exposure to caretakers who were sick with viral syndrome possible flu versus URI.  Symptoms present for about 5 days.  You had flu vaccine this year and in theory if you were exposed to flu already passed the window treatment timeframe.  Will get CBC today and chest x-ray.  I did prescribe Hycodan cough syrup.  Hopefully you will be able to sleep with this.  Also prescribing a azithromycin antibiotic.  Will follow chest x-ray and lab results.  If chest x-ray shows pneumonia then will add Augmentin.  Follow-up in 7 days or as needed.

## 2018-12-02 LAB — CBC WITH DIFFERENTIAL/PLATELET
Basophils Absolute: 0 10*3/uL (ref 0.0–0.1)
Basophils Relative: 1 % (ref 0.0–3.0)
Eosinophils Absolute: 0 10*3/uL (ref 0.0–0.7)
Eosinophils Relative: 0.7 % (ref 0.0–5.0)
HCT: 43.6 % (ref 39.0–52.0)
Hemoglobin: 15.1 g/dL (ref 13.0–17.0)
Lymphocytes Relative: 40.8 % (ref 12.0–46.0)
Lymphs Abs: 1.5 10*3/uL (ref 0.7–4.0)
MCHC: 34.5 g/dL (ref 30.0–36.0)
MCV: 91.2 fl (ref 78.0–100.0)
Monocytes Absolute: 0.5 10*3/uL (ref 0.1–1.0)
Monocytes Relative: 13.5 % — ABNORMAL HIGH (ref 3.0–12.0)
Neutro Abs: 1.6 10*3/uL (ref 1.4–7.7)
Neutrophils Relative %: 44 % (ref 43.0–77.0)
Platelets: 132 10*3/uL — ABNORMAL LOW (ref 150.0–400.0)
RBC: 4.78 Mil/uL (ref 4.22–5.81)
RDW: 12.8 % (ref 11.5–15.5)
WBC: 3.7 10*3/uL — ABNORMAL LOW (ref 4.0–10.5)

## 2018-12-05 ENCOUNTER — Encounter: Payer: Self-pay | Admitting: Medical

## 2018-12-05 ENCOUNTER — Telehealth: Payer: Self-pay | Admitting: *Deleted

## 2018-12-05 NOTE — Telephone Encounter (Signed)
Received Physician Orders from [facility]; forwarded to provider/SLS 02/03

## 2018-12-06 ENCOUNTER — Telehealth: Payer: Self-pay | Admitting: *Deleted

## 2018-12-06 NOTE — Telephone Encounter (Signed)
Received Physician Orders from [facility]; forwarded to provider/SLS 02/04

## 2018-12-13 ENCOUNTER — Encounter: Payer: Self-pay | Admitting: Physical Medicine & Rehabilitation

## 2018-12-13 ENCOUNTER — Encounter: Payer: Medicare Other | Attending: Physical Medicine & Rehabilitation | Admitting: Physical Medicine & Rehabilitation

## 2018-12-13 VITALS — BP 116/87 | HR 94 | Resp 14 | Ht 61.0 in | Wt 103.0 lb

## 2018-12-13 DIAGNOSIS — G8250 Quadriplegia, unspecified: Secondary | ICD-10-CM | POA: Insufficient documentation

## 2018-12-13 NOTE — Patient Instructions (Signed)
THINK ABOUT WHERE YOU WANT BOTOX INJECTIONS AT NEXT VISIT

## 2018-12-13 NOTE — Progress Notes (Signed)
Subjective:    Patient ID: [name], male    DOB: Nov 15, 1985, 33 y.o.   MRN: [id number]  HPI   [name] is here in follow up of his spastic tetraplegia. He's just getting over a 2 week upper respiratory tract infection. He's had good results with recent botox injection. Mother asked him where he wanted next injections and she states he's feeling "ok" at present. Family stretches him daily.      Pain Inventory Average Pain 0 Pain Right Now 0 My pain is no pain  In the last 24 hours, has pain interfered with the following? General activity 0 Relation with others 0 Enjoyment of life 0 What TIME of day is your pain at its worst? no pain Sleep (in general) Fair  Pain is worse with: no pain Pain improves with: no pain Relief from Meds: no pain  Mobility use a wheelchair needs help with transfers  Function disabled: date disabled .  Neuro/Psych trouble walking  Prior Studies Any changes since last visit?  no  Physicians involved in your care Any changes since last visit?  no   Family History  Problem Relation Age of Onset  . Asthma Mother   . Hyperlipidemia Mother   . COPD Mother   . Other Mother        bronchial stasis/ABPA  . Cancer Maternal Grandmother 41       breast  . Hyperlipidemia Maternal Grandmother   . Hypertension Maternal Grandmother   . Cancer Maternal Grandfather        prostate  . Heart disease Paternal Grandfather        CHF  . Osteoporosis Paternal Grandmother   . Arthritis Paternal Grandmother        rheumatoid   Social History   Socioeconomic History  . Marital status: Single    Spouse name: Not on file  . Number of children: 0  . Years of education: Not on file  . Highest education level: Not on file  Occupational History  . Occupation: disbaled  Social Needs  . Financial resource strain: Not on file  . Food insecurity:    Worry: Not on file    Inability: Not on file  . Transportation needs:    Medical: Not on file   Non-medical: Not on file  Tobacco Use  . Smoking status: Never Smoker  . Smokeless tobacco: Never Used  Substance and Sexual Activity  . Alcohol use: No  . Drug use: No  . Sexual activity: Never  Lifestyle  . Physical activity:    Days per week: Not on file    Minutes per session: Not on file  . Stress: Not on file  Relationships  . Social connections:    Talks on phone: Not on file    Gets together: Not on file    Attends religious service: Not on file    Active member of club or organization: Not on file    Attends meetings of clubs or organizations: Not on file    Relationship status: Not on file  Other Topics Concern  . Not on file  Social History Narrative  . Not on file   Past Surgical History:  Procedure Laterality Date  . baclofen trial    . baslofen pump implant    . ears tubes    . EYE SURGERY    . FLEXIBLE SIGMOIDOSCOPY N/A 09/07/2014   Procedure: FLEXIBLE SIGMOIDOSCOPY;  Surgeon: [name], MD;  Location: [facility];  Service: Endoscopy;  Laterality: N/A;  . g-tube insert  August 2006  . hamstring released     to treat contractures.   Marland Kitchen HIP SURGERY     x2 , side   . IR CM INJ ANY COLONIC TUBE W/FLUORO  05/28/2017  . IR GENERIC HISTORICAL  07/01/2016   IR GASTR TUBE CONVERT GASTR-JEJ PER W/FL MOD SED 07/01/2016 [name], MD [facility]  . IR GENERIC HISTORICAL  07/08/2016   IR PATIENT EVAL TECH 0-60 MINS 07/08/2016 [name], MD [facility]  . IR GENERIC HISTORICAL  07/14/2016   IR GJ TUBE CHANGE 07/14/2016 [name], MD [facility]  . IR GENERIC HISTORICAL  07/21/2016   IR PATIENT EVAL TECH 0-60 MINS [facility]  . IR GENERIC HISTORICAL  08/31/2016   IR [place] DUODEN/JEJUNO TUBE PERCUT W/FLUORO 08/31/2016 [name], MD [facility]  . IR GENERIC HISTORICAL  09/03/2016   IR GJ TUBE CHANGE 09/03/2016 [name], MD [facility]  . IR GENERIC HISTORICAL  09/10/2016   IR GASTR TUBE CONVERT GASTR-JEJ PER W/FL MOD SED 09/10/2016 [facility]    . IR GENERIC HISTORICAL  09/16/2016   IR PATIENT EVAL TECH 0-60 MINS [facility]  . IR GENERIC HISTORICAL  09/29/2016   IR GJ TUBE CHANGE 09/29/2016 [name], MD [facility]  . IR GJ TUBE CHANGE  02/05/2017  . IR GJ TUBE CHANGE  05/21/2017  . IR GJ TUBE CHANGE  08/25/2017  . IR GJ TUBE CHANGE  01/18/2018  . IR GJ TUBE CHANGE  02/04/2018  . IR GJ TUBE CHANGE  04/21/2018  . IR GJ TUBE CHANGE  08/18/2018  . IR MECH REMOV OBSTRUC MAT ANY COLON TUBE W/FLUORO  09/02/2018  . IR REPLC GASTRO/COLONIC TUBE PERCUT W/FLUORO  10/17/2018  . PEG PLACEMENT  10/21/2011   Procedure: PERCUTANEOUS ENDOSCOPIC GASTROSTOMY (PEG) REPLACEMENT;  Surgeon: [name], MD;  [facility];  Service: Endoscopy;  Laterality: N/A;  . PEG PLACEMENT N/A 06/13/2013   Procedure: PERCUTANEOUS ENDOSCOPIC GASTROSTOMY (PEG) REPLACEMENT;  Surgeon: [name], MD;  [facility];  Service: Endoscopy;  Laterality: N/A;  . SPINAL FUSION    . spinal fusion to correct 70 degree kyphosis  11-2010  . spinal fusioncorrect 106 degree kyphosis    . SPINE SURGERY  ,11/20/2010, 2011   for correction of severe contracturing spinal kyphosis.   . TONSILLECTOMY     Past Medical History:  Diagnosis Date  . Cerebral palsy ([place])   . Dehydration 11/22/2013  . Depression with anxiety 08/01/2010   Qualifier: Diagnosis of  By: Nelson-Smith CMA (AAMA), Dottie    . Dyslipidemia 08/19/2017  . Esophagitis 2011  . Gastrostomy in place [facility]) 08/31/2013  . GERD (gastroesophageal reflux disease)   . Hyperlipidemia, mild 08/25/2015  . Hyperthyroidism   . Incontinence of feces   . Loss of weight 08/28/2014  . Medicare annual wellness visit, subsequent 08/25/2015  . Mildly underweight adult 03/16/2017  . Palpitations   . Skin lesion of right ear 03/16/2017  . Thyroid disease 08/01/2010   Qualifier: Diagnosis of  By: Nelson-Smith CMA (AAMA), Dottie     BP 116/87   Pulse 94   Resp 14   Ht 5\' 1"  (1.549 m)   Wt 103 lb (46.7  kg)   SpO2 97%   BMI 19.46 kg/m   Opioid Risk Score:   Fall Risk Score:  `1  Depression screen PHQ 2/9  Depression screen [facility] 2/9 02/23/2018 11/22/2017  Decreased  Interest 0 0  Down, Depressed, Hopeless 0 0  PHQ - 2 Score 0 0  Some recent data might be hidden    Review of Systems  Constitutional: Negative.   HENT: Negative.   Eyes: Negative.   Respiratory: Negative.   Gastrointestinal: Negative.   Endocrine: Negative.   Genitourinary: Positive for difficulty urinating.  Musculoskeletal: Positive for gait problem.  Skin: Negative.   Allergic/Immunologic: Negative.   Hematological: Negative.   Psychiatric/Behavioral: Negative.   All other systems reviewed and are negative.      Objective:   Physical Exam  General: No acute distress. In his power chair HEENT: EOMI, oral membranes moist Cards: reg rate  Chest: normal effort Abdomen: Soft, NT, ND Skin: dry, intact Extremities: no edema   Neuro:Earnestine has reasonable insight and awareness. His speech remains largely unintellgibile. He has some simple movement in all four limbs but is limited by tone. He has   tightness in his right SCM as well as both traps which leave him extended and bent to right with rotation to left.. Biceps are 2/4 bilaterally. Wrist and finger flexors/lumbricals are 2/4 with underlying contracture of palmar tendons and finger joints, wrists both pronated---unchanged.. Hamstrings tr2/4 bilaterally, quads became 2/4 after extension of legs.. 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 He does sense pain in all 4 limbs.  Musculoskeletal:Full ROM, No pain with AROM or PROM in the neck, trunk, or extremities. Posture appropriate Psych:Pt's affect is appropriate. Pt is cooperative      Assessment & Plan:  1. 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 Cervical Dystonia   Plan:  1. Will arrange for repeat botox 600 units to potentially neck and shoulder girdle. Consider other  locations depending upon his presentation.  2. Continue with HEP.  .  3. Will see him back in about 1 months for potential injections. 15 minutes of face to face patient care time were spent during this visit. All questions were encouraged and answered.

## 2018-12-27 DIAGNOSIS — F339 Major depressive disorder, recurrent, unspecified: Secondary | ICD-10-CM | POA: Diagnosis not present

## 2019-01-10 ENCOUNTER — Encounter: Payer: Medicare Other | Admitting: Physical Medicine & Rehabilitation

## 2019-01-17 ENCOUNTER — Other Ambulatory Visit: Payer: Self-pay

## 2019-01-17 ENCOUNTER — Encounter: Payer: Self-pay | Admitting: Physical Medicine & Rehabilitation

## 2019-01-17 ENCOUNTER — Encounter: Payer: Medicare Other | Attending: Physical Medicine & Rehabilitation | Admitting: Physical Medicine & Rehabilitation

## 2019-01-17 VITALS — BP 114/79 | HR 93 | Resp 14 | Ht 61.0 in | Wt 103.0 lb

## 2019-01-17 DIAGNOSIS — G243 Spasmodic torticollis: Secondary | ICD-10-CM

## 2019-01-17 DIAGNOSIS — G8250 Quadriplegia, unspecified: Secondary | ICD-10-CM | POA: Insufficient documentation

## 2019-01-17 NOTE — Patient Instructions (Signed)
PLEASE FEEL FREE TO CALL OUR OFFICE WITH ANY PROBLEMS OR QUESTIONS ([phone])      

## 2019-01-17 NOTE — Progress Notes (Signed)
Botox Injection for spasticity using needle EMG guidance Indication: Cervical dystonia  Spastic tetraplegia (HCC)   Dilution: 100 Units/ml        Total Units Injected: 600 Indication: Severe spasticity which interferes with ADL,mobility and/or  hygiene and is unresponsive to medication management and other conservative care Informed consent was obtained after describing risks and benefits of the procedure with the patient. This includes bleeding, bruising, infection, excessive weakness, or medication side effects. A REMS form is on file and signed.  Needle: 23mm injectable monopolar needle electrode Left SCM: 100u in two access points Left medial trap 50 units in 2 access points Number of units per muscle Pectoralis Major 0 units Pectoralis Minor 0 units Biceps 25 units left, 25 right Brachioradialis 25 units left, 25u right FCR 25 units left, 25u right FCU 25 units left, 25 u right FDS 75 units left 75 u right FDP 50 units left, 50 u right FPL   units Pronator Teres   units Pronator Quadratus   units  All injections were done after obtaining appropriate EMG activity and after negative drawback for blood. The patient tolerated the procedure well. Post procedure instructions were given. Return in about 3 months (around 04/19/2019).

## 2019-02-21 ENCOUNTER — Ambulatory Visit (INDEPENDENT_AMBULATORY_CARE_PROVIDER_SITE_OTHER): Payer: Medicare Other | Admitting: Family Medicine

## 2019-02-21 ENCOUNTER — Other Ambulatory Visit: Payer: Self-pay

## 2019-02-21 DIAGNOSIS — G8250 Quadriplegia, unspecified: Secondary | ICD-10-CM

## 2019-02-21 DIAGNOSIS — R32 Unspecified urinary incontinence: Secondary | ICD-10-CM

## 2019-02-21 DIAGNOSIS — R1310 Dysphagia, unspecified: Secondary | ICD-10-CM

## 2019-02-21 DIAGNOSIS — R1319 Other dysphagia: Secondary | ICD-10-CM

## 2019-02-21 NOTE — Assessment & Plan Note (Signed)
Uses [facility] for supplies.

## 2019-02-21 NOTE — Assessment & Plan Note (Signed)
Continues to maintain tube feeds and his mother is considering a change from [facility] to [facility] who also manages his incontinence supplies. She will let us know if she decides to make the switch

## 2019-02-21 NOTE — Assessment & Plan Note (Signed)
Wheelchair bound and follows closely with Dr Naaman Plummer of [facility]

## 2019-02-21 NOTE — Progress Notes (Signed)
Virtual Visit via Video Note  I connected with [name] on 02/21/19 at 10:00 AM EDT by a video enabled telemedicine application and verified that I am speaking with the correct person using two identifiers.   I discussed the limitations of evaluation and management by telemedicine and the availability of in person appointments. The patient expressed understanding and agreed to proceed. Magdalene Molly, CMA was able to get patient and his mother on the video platform for the visit.     Subjective:    Patient ID: [name], male    DOB: [birth year], 33 y.o.   MRN: [id number]  No chief complaint on file.   HPI Patient is in today for follow up on chronic medical concerns includng cerebral palsy with tetraplegia, urinary and fecal incontinence, takes nothing by mouth and has tube feeds and more. He had a respiratory illness in January that his Mom is questioning if it was Covid. He had roughly 4 days of temps up to 103 that would respond some to Tylenol and then recurred. He started with a sore throat, dry cough, lethargy, myalges. He was seen and treated with a codeine cough suppressant to help him rest and eventually he recovered. No persistent symptoms. No c/o CP/palp/SOB/HA/congestion/fevers. Taking meds as prescribed  Past Medical History:  Diagnosis Date  . Cerebral palsy ([place])   . Dehydration 11/22/2013  . Depression with anxiety 08/01/2010   Qualifier: Diagnosis of  By: Nelson-Smith CMA (AAMA), Dottie    . Dyslipidemia 08/19/2017  . Esophagitis 2011  . Gastrostomy in place [facility]) 08/31/2013  . GERD (gastroesophageal reflux disease)   . Hyperlipidemia, mild 08/25/2015  . Hyperthyroidism   . Incontinence of feces   . Loss of weight 08/28/2014  . Medicare annual wellness visit, subsequent 08/25/2015  . Mildly underweight adult 03/16/2017  . Palpitations   . Skin lesion of right ear 03/16/2017  . Thyroid disease 08/01/2010   Qualifier: Diagnosis of  By: Harlon Ditty CMA  (AAMA), Dottie      Past Surgical History:  Procedure Laterality Date  . baclofen trial    . baslofen pump implant    . ears tubes    . EYE SURGERY    . FLEXIBLE SIGMOIDOSCOPY N/A 09/07/2014   Procedure: FLEXIBLE SIGMOIDOSCOPY;  Surgeon: [name], MD;  Location: [facility] ENDOSCOPY;  Service: Endoscopy;  Laterality: N/A;  . g-tube insert  August 2006  . hamstring released     to treat contractures.   Marland Kitchen HIP SURGERY     x2 , side   . IR CM INJ ANY COLONIC TUBE W/FLUORO  05/28/2017  . IR GENERIC HISTORICAL  07/01/2016   IR GASTR TUBE CONVERT GASTR-JEJ PER W/FL MOD SED 07/01/2016 [name], MD [facility]  . IR GENERIC HISTORICAL  07/08/2016   IR PATIENT EVAL TECH 0-60 MINS 07/08/2016 [name], MD [facility]  . IR GENERIC HISTORICAL  07/14/2016   IR GJ TUBE CHANGE 07/14/2016 [name], MD [facility]  . IR GENERIC HISTORICAL  07/21/2016   IR PATIENT EVAL TECH 0-60 MINS [facility]  . IR GENERIC HISTORICAL  08/31/2016   IR [place] DUODEN/JEJUNO TUBE PERCUT W/FLUORO 08/31/2016 [name], MD [facility]  . IR GENERIC HISTORICAL  09/03/2016   IR GJ TUBE CHANGE 09/03/2016 [name], MD [facility]  . IR GENERIC HISTORICAL  09/10/2016   IR GASTR TUBE CONVERT GASTR-JEJ PER W/FL MOD SED 09/10/2016 [facility]  . IR GENERIC HISTORICAL  09/16/2016   IR PATIENT  EVAL TECH 0-60 MINS [facility]  . IR GENERIC HISTORICAL  09/29/2016   IR GJ TUBE CHANGE 09/29/2016 [name], MD [facility]  . IR GJ TUBE CHANGE  02/05/2017  . IR GJ TUBE CHANGE  05/21/2017  . IR GJ TUBE CHANGE  08/25/2017  . IR GJ TUBE CHANGE  01/18/2018  . IR GJ TUBE CHANGE  02/04/2018  . IR GJ TUBE CHANGE  04/21/2018  . IR GJ TUBE CHANGE  08/18/2018  . IR MECH REMOV OBSTRUC MAT ANY COLON TUBE W/FLUORO  09/02/2018  . IR REPLC GASTRO/COLONIC TUBE PERCUT W/FLUORO  10/17/2018  . PEG PLACEMENT  10/21/2011   Procedure: PERCUTANEOUS ENDOSCOPIC GASTROSTOMY (PEG) REPLACEMENT;  Surgeon: [name], MD;  [facility];  Service: Endoscopy;  Laterality: N/A;  . PEG PLACEMENT N/A 06/13/2013   Procedure: PERCUTANEOUS ENDOSCOPIC GASTROSTOMY (PEG) REPLACEMENT;  Surgeon: [name], MD;  [facility];  Service: Endoscopy;  Laterality: N/A;  . SPINAL FUSION    . spinal fusion to correct 70 degree kyphosis  11-2010  . spinal fusioncorrect 106 degree kyphosis    . SPINE SURGERY  ,11/20/2010, 2011   for correction of severe contracturing spinal kyphosis.   . TONSILLECTOMY      Family History  Problem Relation Age of Onset  . Asthma Mother   . Hyperlipidemia Mother   . COPD Mother   . Other Mother        bronchial stasis/ABPA  . Cancer Maternal Grandmother 32       breast  . Hyperlipidemia Maternal Grandmother   . Hypertension Maternal Grandmother   . Cancer Maternal Grandfather        prostate  . Heart disease Paternal Grandfather        CHF  . Osteoporosis Paternal Grandmother   . Arthritis Paternal Grandmother        rheumatoid    Social History   Socioeconomic History  . Marital status: Single    Spouse name: Not on file  . Number of children: 0  . Years of education: Not on file  . Highest education level: Not on file  Occupational History  . Occupation: disbaled  Social Needs  . Financial resource strain: Not on file  . Food insecurity:    Worry: Not on file    Inability: Not on file  . Transportation needs:    Medical: Not on file    Non-medical: Not on file  Tobacco Use  . Smoking status: Never Smoker  . Smokeless tobacco: Never Used  Substance and Sexual Activity  . Alcohol use: No  . Drug use: No  . Sexual activity: Never  Lifestyle  . Physical activity:    Days per week: Not on file    Minutes per session: Not on file  . Stress: Not on file  Relationships  . Social connections:    Talks on phone: Not on file    Gets together: Not on file    Attends religious service: Not on file    Active member of club or organization: Not on file    Attends  meetings of clubs or organizations: Not on file    Relationship status: Not on file  . Intimate partner violence:    Fear of current or ex partner: Not on file    Emotionally abused: Not on file    Physically abused: Not on file    Forced sexual activity: Not on file  Other Topics Concern  . Not  on file  Social History Narrative  . Not on file    Outpatient Medications Prior to Visit  Medication Sig Dispense Refill  . AMBULATORY NON FORMULARY MEDICATION Medication Name: MIC gastrostomy/bolus feeding tube 24 French Part number 0110-24. #2 and 10 cc lurer lock syringe #2 Dx: 4 Device 2  . azithromycin (ZITHROMAX) 200 MG/5ML suspension 12 ml po day 1 then 6 ml po x 4 days 36 mL 0  . bacitracin 500 UNIT/GM ointment Apply 1 application topically 2 (two) times daily. 30 g 1  . clotrimazole-betamethasone (LOTRISONE) cream Apply 1 application topically 2 (two) times daily. 45 g 1  . dantrolene (DANTRIUM) 50 MG capsule TAKE 1 CAPSULE BY MOUTH 3 TIMES A DAY. 90 capsule 4  . divalproex (DEPAKOTE SPRINKLE) 125 MG capsule Take 2 capsules in morning, 5 capsules at bedtime (Patient taking differently: 250-625 mg. Take 2 capsules in morning, 5 capsules at bedtime) 210 capsule 4  . HYDROcodone-homatropine (HYCODAN) 5-1.5 MG/5ML syrup Take 5 mLs by mouth every 6 (six) hours as needed. 100 mL 0  . Incontinence Supply Disposable (PREVAIL BREEZERS MEDIUM) MISC pkg of 16- size medium 32" to 44"  Breathable cloth-like outer fabric (can't use the plastic outer surgace  Item # [id number] 16 each 6  . LORazepam (ATIVAN) 1 MG tablet Take one tablet in evenings as needed for insomnia. (Patient taking differently: Take one tablet in evenings for insomnia.) 30 tablet 1  . mupirocin ointment (BACTROBAN) 2 % Apply to area thin film twice daily if needed 22 g 0  . NON FORMULARY Bard Leg Bag Extension tubing w/Connector 18", Sterile, latex-free  Item# [id number]    . NON FORMULARY Colorplast Freedom Cath Latex  Self-Adhering Male External Catheter 7mm Diameter Intermediate  Item# [id number]    . NONFORMULARY OR COMPOUNDED ITEM [employer] REF [id number] - [place] Joey Pump Set with Flush Bags - 1000 mL 1 each 0  . Nutritional Supplements (FEEDING SUPPLEMENT, JEVITY 1.5 CAL,) LIQD Run tube feed at 80 mL per hour over 15 hours. (3 cartons) 80 mL 11  . Nutritional Supplements (FEEDING SUPPLEMENT, VITAL 1.5 CAL,) LIQD Place 80 mLs into feeding tube continuous. 2 cartons 80 mL 11  . nystatin (MYCOSTATIN) 100000 UNIT/ML suspension 5 ml per tube qid 60 mL 0  . OLANZapine (ZYPREXA) 2.5 MG tablet Place 5 mg into feeding tube 2 (two) times daily.   3  . Omeprazole-Sodium Bicarbonate (ZEGERID) 20-1100 MG CAPS capsule Take 1 capsule by mouth daily before breakfast.    . Ostomy Supplies (PROTECTIVE BARRIER WIPES) MISC 1-1/4" X 3"  Item #[id number] 75 each 6  . PARoxetine (PAXIL) 10 MG tablet Take 15 mg by mouth daily.    . sucralfate (CARAFATE) 1 g tablet Take 1 tablet (1 g total) by mouth 4 (four) times daily -  with meals and at bedtime. 120 tablet 5   No facility-administered medications prior to visit.     Allergies  Allergen Reactions  . Ambien [Zolpidem Tartrate] Nausea Only  . Antihistamines, Chlorpheniramine-Type     Other reaction(s): Other (See Comments) Other Reaction: agitation  . Codeine Other (See Comments)    Makes patient too active after a few days.  . Metoclopramide Other (See Comments)    Delusion, emotionality   . Baclofen Anxiety  . Pheniramine Rash    Other reaction(s): Other (See Comments) Other Reaction: agitation  . Sulfa Antibiotics Rash  . Sulfonamide Derivatives Rash    Review of Systems  Constitutional: Negative for fever and malaise/fatigue.  HENT: Negative for congestion.   Eyes: Negative for blurred vision.  Respiratory: Negative for shortness of breath.   Cardiovascular: Negative for chest pain, palpitations and leg swelling.  Gastrointestinal: Negative for abdominal pain,  blood in stool and nausea.  Genitourinary: Negative for dysuria and frequency.  Musculoskeletal: Negative for falls.  Skin: Negative for rash.  Neurological: Positive for focal weakness. Negative for dizziness, speech change, loss of consciousness and headaches.  Endo/Heme/Allergies: Negative for environmental allergies.  Psychiatric/Behavioral: Negative for depression. The patient is not nervous/anxious.        Objective:    Physical Exam Constitutional:      Appearance: Normal appearance. He is not ill-appearing.     Comments: smiling  HENT:     Head: Normocephalic and atraumatic.     Nose: Nose normal.  Pulmonary:     Effort: Pulmonary effort is normal.  Neurological:     Mental Status: He is alert.     Comments: Spastic tetraplegia, in wheelchair  Psychiatric:        Mood and Affect: Mood normal.        Behavior: Behavior normal.     BP 110/79   Pulse 92   Temp 98.5 F (36.9 C)   SpO2 98%  Wt Readings from Last 3 Encounters:  01/17/19 103 lb (46.7 kg)  12/13/18 103 lb (46.7 kg)  09/06/18 103 lb (46.7 kg)    Diabetic Foot Exam - Simple   No data filed     Lab Results  Component Value Date   WBC 3.7 (L) 12/01/2018   HGB 15.1 12/01/2018   HCT 43.6 12/01/2018   PLT 132.0 (L) 12/01/2018   GLUCOSE 67 (L) 08/16/2018   CHOL 119 08/16/2018   TRIG 78.0 08/16/2018   HDL 32.70 (L) 08/16/2018   LDLCALC 70 08/16/2018   ALT 31 08/16/2018   AST 21 08/16/2018   NA 138 08/16/2018   K 4.1 08/16/2018   CL 101 08/16/2018   CREATININE 0.34 (L) 08/16/2018   BUN 8 08/16/2018   CO2 28 08/16/2018   TSH 2.22 08/16/2018   HGBA1C 5.0 04/29/2007    Lab Results  Component Value Date   TSH 2.22 08/16/2018   Lab Results  Component Value Date   WBC 3.7 (L) 12/01/2018   HGB 15.1 12/01/2018   HCT 43.6 12/01/2018   MCV 91.2 12/01/2018   PLT 132.0 (L) 12/01/2018   Lab Results  Component Value Date   NA 138 08/16/2018   K 4.1 08/16/2018   CO2 28 08/16/2018   GLUCOSE  67 (L) 08/16/2018   BUN 8 08/16/2018   CREATININE 0.34 (L) 08/16/2018   BILITOT 0.4 08/16/2018   ALKPHOS 90 08/16/2018   AST 21 08/16/2018   ALT 31 08/16/2018   PROT 7.4 08/16/2018   ALBUMIN 4.3 08/16/2018   CALCIUM 9.3 08/16/2018   ANIONGAP 11 09/18/2014   GFR 314.92 08/16/2018   Lab Results  Component Value Date   CHOL 119 08/16/2018   Lab Results  Component Value Date   HDL 32.70 (L) 08/16/2018   Lab Results  Component Value Date   LDLCALC 70 08/16/2018   Lab Results  Component Value Date   TRIG 78.0 08/16/2018   Lab Results  Component Value Date   CHOLHDL 4 08/16/2018   Lab Results  Component Value Date   HGBA1C 5.0 04/29/2007       Assessment & Plan:   Problem List Items Addressed This Visit    Esophageal dysphagia (Chronic)  Continues to maintain tube feeds and his mother is considering a change from [place] to [facility] who also manages his incontinence supplies. She will let us know if she decides to make the switch      Urinary incontinence    Uses [facility] for supplies.       Spastic tetraplegia ([place])    Wheelchair bound and follows closely with Dr Naaman Plummer [facility]         I am having Lajuana Matte "[name]" maintain his divalproex, clotrimazole-betamethasone, Omeprazole-Sodium Bicarbonate, NON FORMULARY, NON FORMULARY, Protective Barrier Wipes, Prevail Breezers Medium, bacitracin, NONFORMULARY OR COMPOUNDED ITEM, LORazepam, PARoxetine, AMBULATORY NON FORMULARY MEDICATION, OLANZapine, mupirocin ointment, feeding supplement (JEVITY 1.5 CAL), feeding supplement (VITAL 1.5 CAL), sucralfate, dantrolene, azithromycin, HYDROcodone-homatropine, and nystatin.  No orders of the defined types were placed in this encounter.   I discussed the assessment and treatment plan with the patient. The patient was provided an opportunity to ask questions and all were answered. The patient agreed with the plan and demonstrated an understanding of  the instructions.   The patient was advised to call back or seek an in-person evaluation if the symptoms worsen or if the condition fails to improve as anticipated.  I provided 25 minutes of non-face-to-face time during this encounter.   [name], MD

## 2019-03-08 ENCOUNTER — Encounter: Payer: Self-pay | Admitting: Family Medicine

## 2019-03-09 ENCOUNTER — Other Ambulatory Visit (HOSPITAL_COMMUNITY): Payer: Self-pay | Admitting: Interventional Radiology

## 2019-03-09 DIAGNOSIS — R633 Feeding difficulties, unspecified: Secondary | ICD-10-CM

## 2019-03-10 ENCOUNTER — Other Ambulatory Visit: Payer: Self-pay | Admitting: Student

## 2019-03-10 ENCOUNTER — Inpatient Hospital Stay (HOSPITAL_COMMUNITY): Admission: RE | Admit: 2019-03-10 | Payer: Medicare Other | Source: Ambulatory Visit

## 2019-03-13 ENCOUNTER — Telehealth: Payer: Self-pay

## 2019-03-13 ENCOUNTER — Encounter: Payer: Self-pay | Admitting: Family Medicine

## 2019-03-13 DIAGNOSIS — G809 Cerebral palsy, unspecified: Secondary | ICD-10-CM

## 2019-03-13 DIAGNOSIS — M62838 Other muscle spasm: Secondary | ICD-10-CM

## 2019-03-13 NOTE — Telephone Encounter (Signed)
Copied from [place] [id number]. Topic: General - Other >> Mar 13, 2019  3:46 PM Rainey Pines A wrote: Reason for CRM: Deandra from [facility] wanted [name] to know that she doesn't have a fax number but to mail the referral to her at : [address],[place] [id number]

## 2019-03-13 NOTE — Telephone Encounter (Signed)
Called number provided left message for [name] to give the office a call back to give me updated fax number

## 2019-03-14 ENCOUNTER — Other Ambulatory Visit (HOSPITAL_COMMUNITY): Payer: Self-pay | Admitting: Interventional Radiology

## 2019-03-14 ENCOUNTER — Ambulatory Visit (HOSPITAL_COMMUNITY)
Admission: RE | Admit: 2019-03-14 | Discharge: 2019-03-14 | Disposition: A | Discharge status: Home / Self Care | Payer: Medicare Other | Source: Ambulatory Visit | Attending: Interventional Radiology | Admitting: Interventional Radiology

## 2019-03-14 ENCOUNTER — Encounter (HOSPITAL_COMMUNITY): Payer: Self-pay | Admitting: Interventional Radiology

## 2019-03-14 ENCOUNTER — Other Ambulatory Visit: Payer: Self-pay

## 2019-03-14 DIAGNOSIS — R633 Feeding difficulties, unspecified: Secondary | ICD-10-CM

## 2019-03-14 DIAGNOSIS — K9423 Gastrostomy malfunction: Secondary | ICD-10-CM | POA: Diagnosis not present

## 2019-03-14 DIAGNOSIS — Z431 Encounter for attention to gastrostomy: Secondary | ICD-10-CM | POA: Diagnosis not present

## 2019-03-14 HISTORY — PX: IR GASTR TUBE CONVERT GASTR-JEJ PER W/FL MOD SED: IMG2332

## 2019-03-14 MED ORDER — LIDOCAINE VISCOUS HCL 2 % MT SOLN
OROMUCOSAL | Status: DC | PRN
  Administered 2019-03-14: 10 mL

## 2019-03-14 MED ORDER — LIDOCAINE VISCOUS HCL 2 % MT SOLN
OROMUCOSAL | Status: AC
  Filled 2019-03-14: qty 15

## 2019-03-14 MED ORDER — ALL-BODY MASSAGE MISC: 1.0000 [IU]/h | 99 refills | Status: DC | PRN

## 2019-03-14 MED ORDER — IOHEXOL 300 MG/ML  SOLN
50.0000 mL | Freq: Once | INTRAMUSCULAR | Status: AC | PRN
  Administered 2019-03-14: 18 mL

## 2019-03-14 NOTE — Procedures (Signed)
Pre procedural Dx: Dysphagia, poorly functioning feeding tube. Post procedural Dx: Same  Successful fluoroscopic guided replacement of 24 Fr gastrojejunostomy tube.   The feeding tube is ready for immediate use.  EBL: None  Complications: None immediate.  Ronny Bacon, MD Pager #: [phone]

## 2019-03-14 NOTE — Telephone Encounter (Signed)
Printed rx and mailed

## 2019-03-15 NOTE — Telephone Encounter (Signed)
Please let the patient's mother know that I think this needs to be replaced in IR due to the issues/problems that she raised Please make this referral for them, thank you

## 2019-03-20 ENCOUNTER — Other Ambulatory Visit: Payer: Self-pay | Admitting: Radiology

## 2019-03-24 IMAGING — XA IR REPLACE G/J TUBE W/ FLUORO
1 series · 7 of 7 positions shown · non-contrast
Comparison: none

INDICATION: 32-year-old with cerebral palsy and chronic GJ feeding tube. Tube
dysfunction and needs replacement.

[Series 300: tube placements · 7 of 7 slices shown]
[im 1/7]
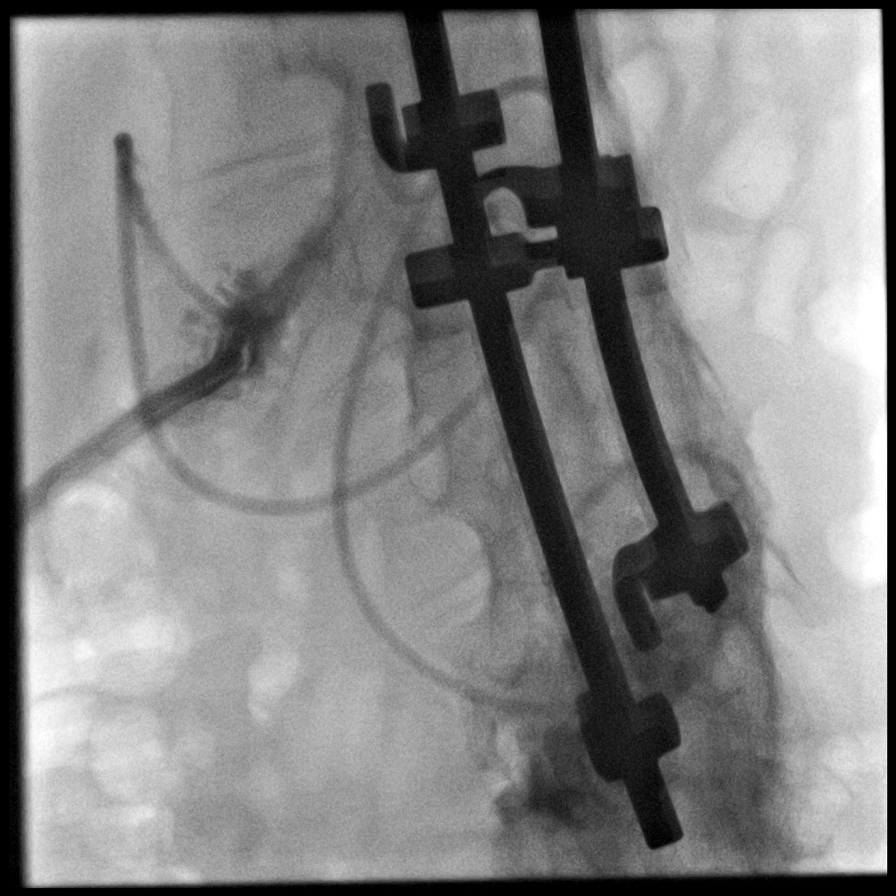
[im 2/7]
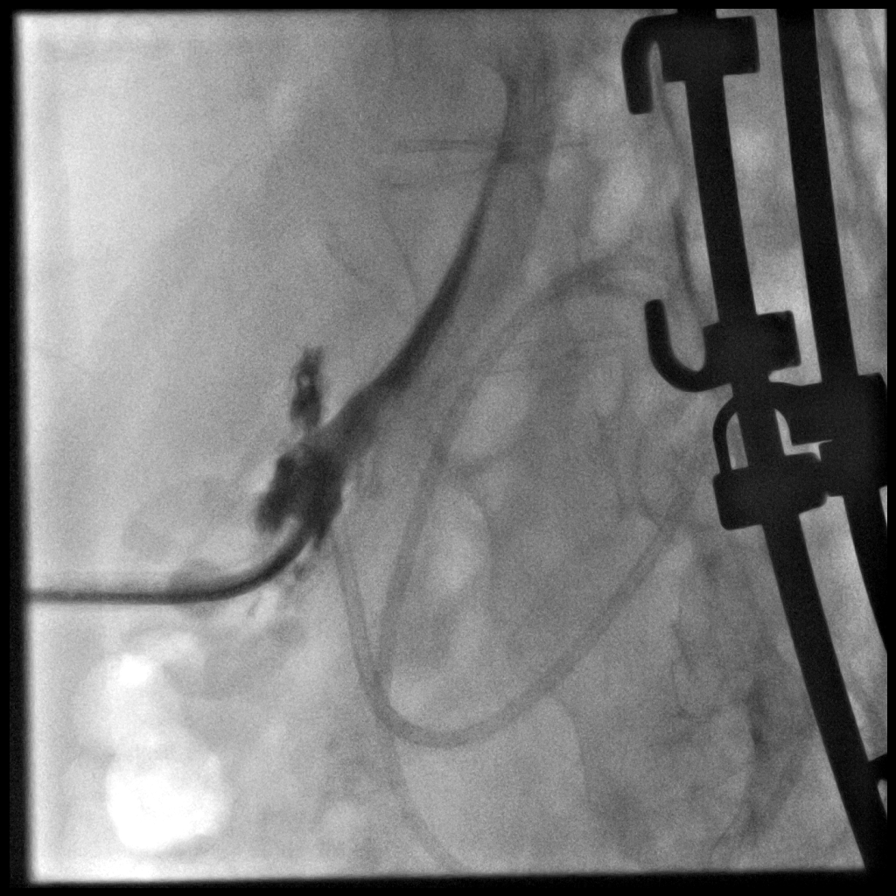
[im 3/7]
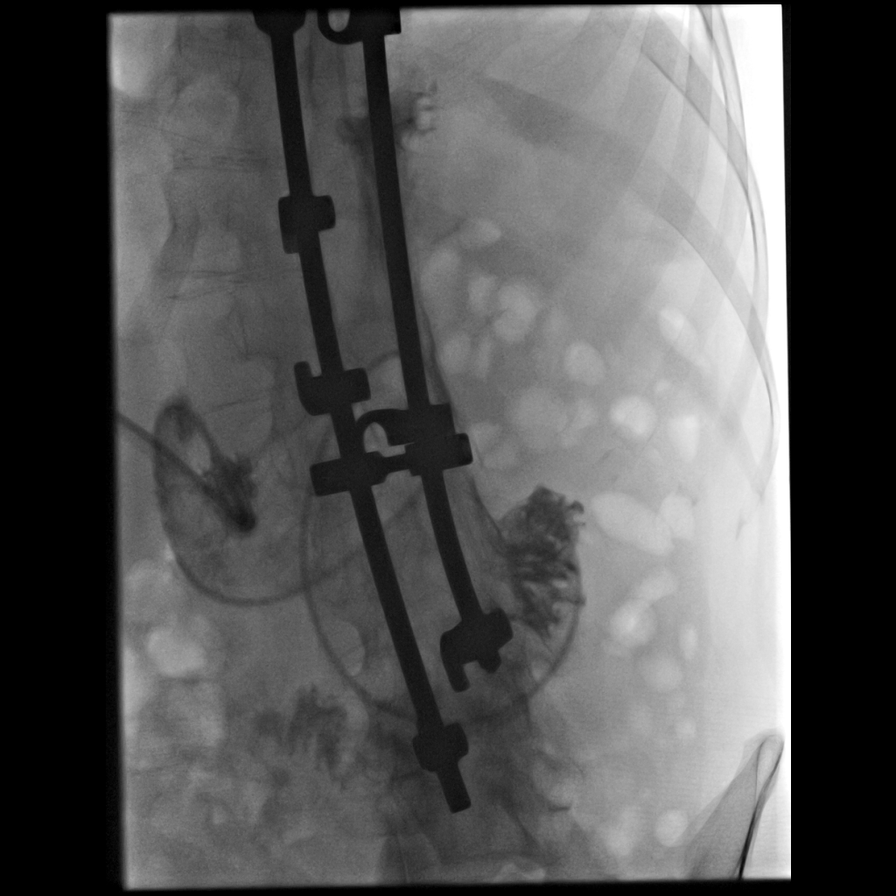
[im 4/7]
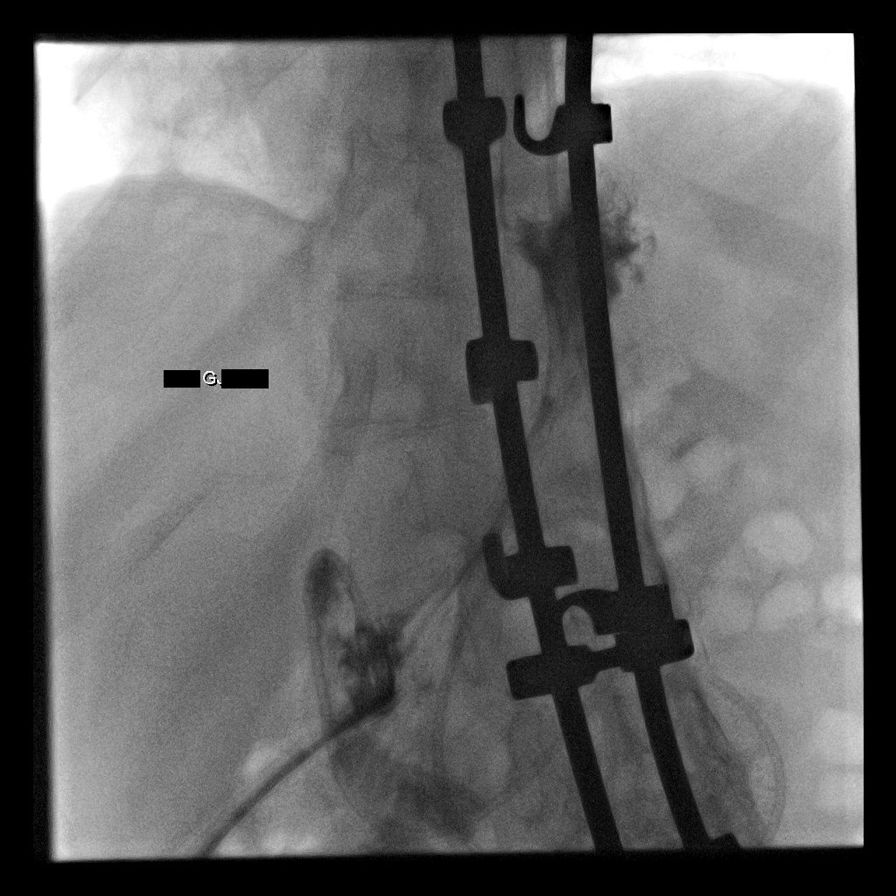
[im 5/7]
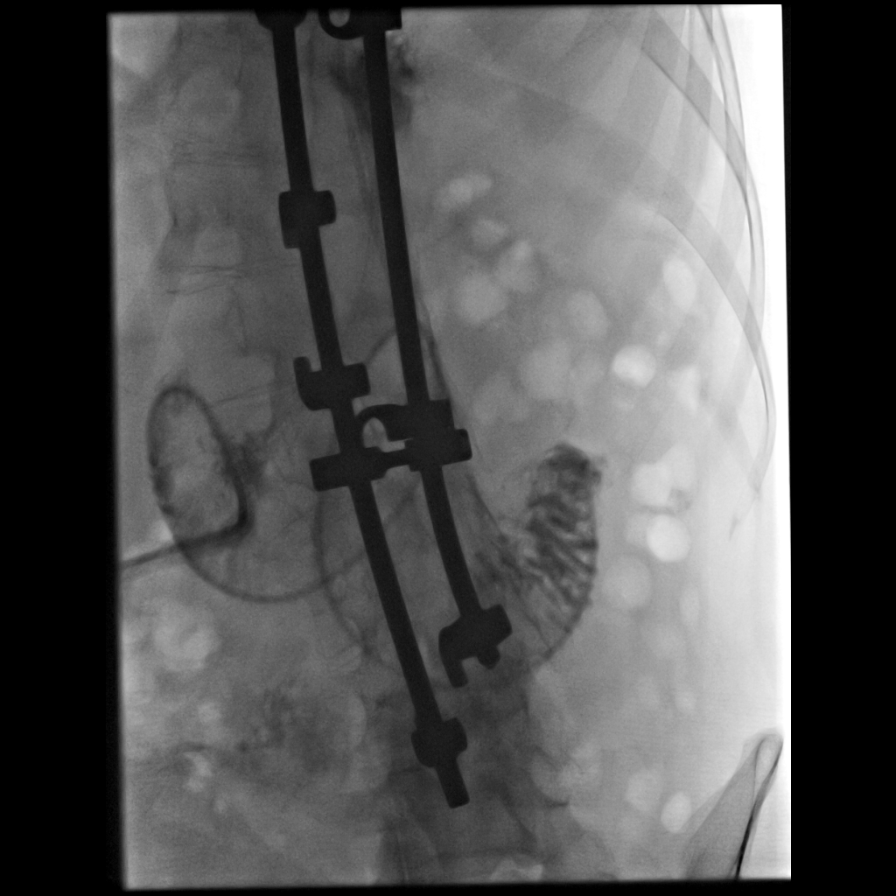
[im 6/7]
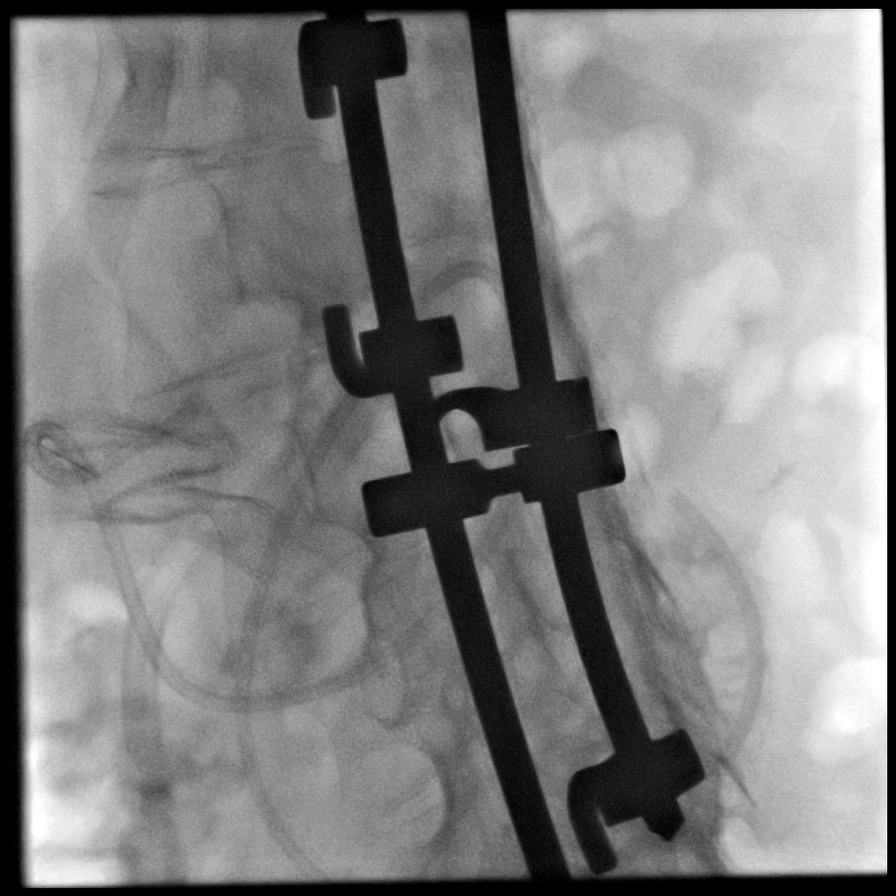
[im 7/7]
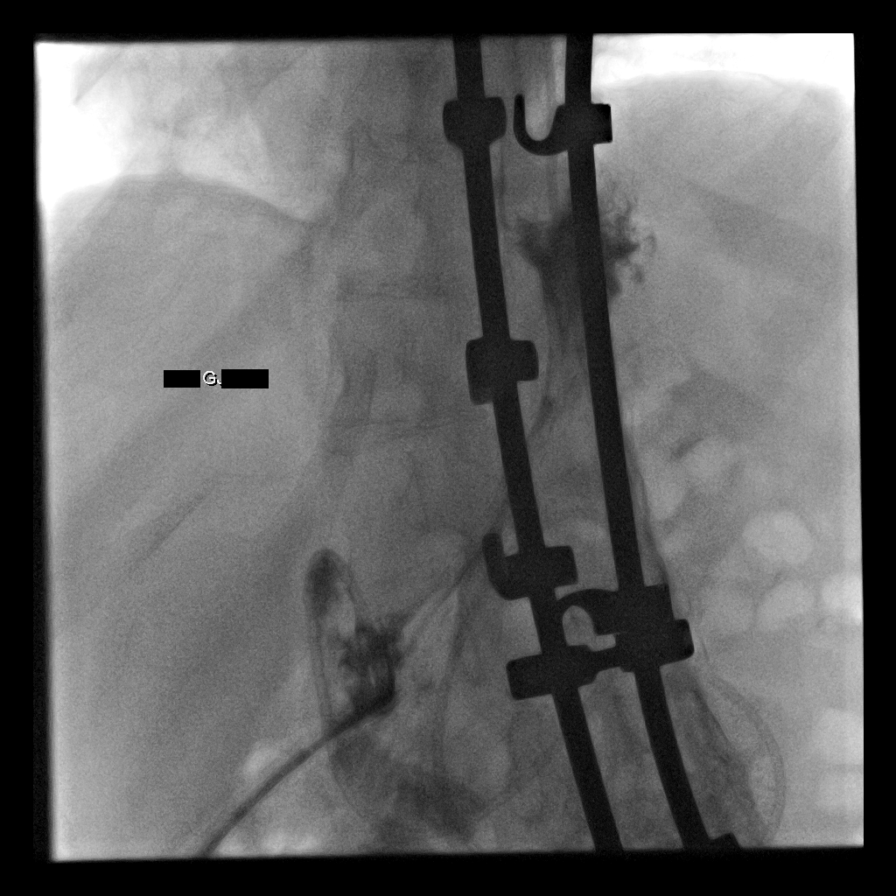

[7 of 7 positions shown; findings below may reference images not displayed]

EXAM:
GASTROJEJUNOSTOMY TUBE EXCHANGE WITH FLUOROSCOPY

MEDICATIONS:
None

ANESTHESIA/SEDATION:
None

CONTRAST:  20 mL-administered into the gastric lumen.

FLUOROSCOPY TIME:  Fluoroscopy Time: 1 minutes and 42 seconds,
mGy

COMPLICATIONS:
None immediate.

PROCEDURE:
Patient was placed supine on the table. The existing tube was
prepped and draped in sterile fashion. Maximal barrier sterile
technique was utilized including caps, mask, sterile gowns, sterile
gloves, sterile drape, hand hygiene and skin antiseptic.

Contrast was injected through both lumens 2 confirm placement. The
balloon was deflated. The gastric portion of the tube was removed.
The tube was cut at the junction of the gastric and jejunal tubes. A
Glidewire was advanced through the jejunal tube and the tube was
removed over the wire. A new 22 French GJ tube was selected and the
weighted tip was cut off. The GJ tube was easily advanced over wire.
Tip was positioned in the small bowel. Gastric balloon was inflated
with 10 mL of saline. Contrast was injected through both lumens to
confirm placement in the stomach and small bowel. Both lumens were
flushed with saline.
FINDINGS: Tip of the feeding tube is in the small bowel.
IMPRESSION: Successful exchange of the GJ tube with fluoroscopy.

## 2019-03-30 ENCOUNTER — Other Ambulatory Visit: Payer: Self-pay | Admitting: Physical Medicine & Rehabilitation

## 2019-03-30 DIAGNOSIS — G809 Cerebral palsy, unspecified: Secondary | ICD-10-CM

## 2019-03-30 DIAGNOSIS — G8250 Quadriplegia, unspecified: Secondary | ICD-10-CM

## 2019-04-12 ENCOUNTER — Encounter: Payer: Medicare Other | Attending: Physical Medicine & Rehabilitation | Admitting: Physical Medicine & Rehabilitation

## 2019-04-12 ENCOUNTER — Other Ambulatory Visit: Payer: Self-pay

## 2019-04-12 ENCOUNTER — Encounter: Payer: Self-pay | Admitting: Physical Medicine & Rehabilitation

## 2019-04-12 DIAGNOSIS — G8250 Quadriplegia, unspecified: Secondary | ICD-10-CM | POA: Diagnosis not present

## 2019-04-12 DIAGNOSIS — G809 Cerebral palsy, unspecified: Secondary | ICD-10-CM | POA: Diagnosis not present

## 2019-04-12 MED ORDER — DANTROLENE SODIUM 50 MG PO CAPS: 50.0000 mg | ORAL_CAPSULE | Freq: Three times a day (TID) | ORAL | 6 refills | Status: DC

## 2019-04-12 NOTE — Progress Notes (Signed)
Subjective:    Patient ID: [name], male    DOB: 18-May-1986, 33 y.o.   MRN: [id number]  HPI   [name] is here in follow up of his spastic CP. We last did injections in June. He had good results for the most part again.  We injected his sternocleidomastoid as well as trap in addition to his elbow wrist and finger flexors.  Injections were done in March.  We did not set him up for injections today.  Team thinks he can wait a month before repeating these again at least.  He had  feeding tube replaced the other day.  He reports no changes in health status.  He is accompanied by his father today.  Pain Inventory Average Pain 0 Pain Right Now 0 My pain is na  In the last 24 hours, has pain interfered with the following? General activity 0 Relation with others 0 Enjoyment of life 0 What TIME of day is your pain at its worst? na Sleep (in general) Fair  Pain is worse with: na Pain improves with: na Relief from Meds: na  Mobility ability to climb steps?  no do you drive?  no use a wheelchair  Function disabled: date disabled .  Neuro/Psych bladder control problems bowel control problems spasms anxiety  Prior Studies Any changes since last visit?  no  Physicians involved in your care Any changes since last visit?  no   Family History  Problem Relation Age of Onset  . Asthma Mother   . Hyperlipidemia Mother   . COPD Mother   . Other Mother        bronchial stasis/ABPA  . Cancer Maternal Grandmother 17       breast  . Hyperlipidemia Maternal Grandmother   . Hypertension Maternal Grandmother   . Cancer Maternal Grandfather        prostate  . Heart disease Paternal Grandfather        CHF  . Osteoporosis Paternal Grandmother   . Arthritis Paternal Grandmother        rheumatoid   Social History   Socioeconomic History  . Marital status: Single    Spouse name: Not on file  . Number of children: 0  . Years of education: Not on file  . Highest education  level: Not on file  Occupational History  . Occupation: disbaled  Social Needs  . Financial resource strain: Not on file  . Food insecurity:    Worry: Not on file    Inability: Not on file  . Transportation needs:    Medical: Not on file    Non-medical: Not on file  Tobacco Use  . Smoking status: Never Smoker  . Smokeless tobacco: Never Used  Substance and Sexual Activity  . Alcohol use: No  . Drug use: No  . Sexual activity: Never  Lifestyle  . Physical activity:    Days per week: Not on file    Minutes per session: Not on file  . Stress: Not on file  Relationships  . Social connections:    Talks on phone: Not on file    Gets together: Not on file    Attends religious service: Not on file    Active member of club or organization: Not on file    Attends meetings of clubs or organizations: Not on file    Relationship status: Not on file  Other Topics Concern  . Not on file  Social History Narrative  . Not on file  Past Surgical History:  Procedure Laterality Date  . baclofen trial    . baslofen pump implant    . ears tubes    . EYE SURGERY    . FLEXIBLE SIGMOIDOSCOPY N/A 09/07/2014   Procedure: FLEXIBLE SIGMOIDOSCOPY;  Surgeon: [name], MD;  Location: [facility];  Service: Endoscopy;  Laterality: N/A;  . g-tube insert  August 2006  . hamstring released     to treat contractures.   Marland Kitchen HIP SURGERY     x2 , side   . IR CM INJ ANY COLONIC TUBE W/FLUORO  05/28/2017  . IR GASTR TUBE CONVERT GASTR-JEJ PER W/FL MOD SED  03/14/2019  . IR GENERIC HISTORICAL  07/01/2016   IR GASTR TUBE CONVERT GASTR-JEJ PER W/FL MOD SED 07/01/2016 [name], MD [facility]  . IR GENERIC HISTORICAL  07/08/2016   IR PATIENT EVAL TECH 0-60 MINS 07/08/2016 [name], MD [facility]  . IR GENERIC HISTORICAL  07/14/2016   IR GJ TUBE CHANGE 07/14/2016 [name], MD [facility]  . IR GENERIC HISTORICAL  07/21/2016   IR PATIENT EVAL TECH 0-60 MINS [facility]  . IR GENERIC  HISTORICAL  08/31/2016   IR [place] DUODEN/JEJUNO TUBE PERCUT W/FLUORO 08/31/2016 [name], MD [facility]  . IR GENERIC HISTORICAL  09/03/2016   IR GJ TUBE CHANGE 09/03/2016 [name], MD [facility]  . IR GENERIC HISTORICAL  09/10/2016   IR GASTR TUBE CONVERT GASTR-JEJ PER W/FL MOD SED 09/10/2016 [facility]  . IR GENERIC HISTORICAL  09/16/2016   IR PATIENT EVAL TECH 0-60 MINS [facility]  . IR GENERIC HISTORICAL  09/29/2016   IR GJ TUBE CHANGE 09/29/2016 [name], MD [facility]  . IR GJ TUBE CHANGE  02/05/2017  . IR GJ TUBE CHANGE  05/21/2017  . IR GJ TUBE CHANGE  08/25/2017  . IR GJ TUBE CHANGE  01/18/2018  . IR GJ TUBE CHANGE  02/04/2018  . IR GJ TUBE CHANGE  04/21/2018  . IR GJ TUBE CHANGE  08/18/2018  . IR MECH REMOV OBSTRUC MAT ANY COLON TUBE W/FLUORO  09/02/2018  . IR REPLC GASTRO/COLONIC TUBE PERCUT W/FLUORO  10/17/2018  . PEG PLACEMENT  10/21/2011   Procedure: PERCUTANEOUS ENDOSCOPIC GASTROSTOMY (PEG) REPLACEMENT;  Surgeon: [name], MD;  Location: [facility];  Service: Endoscopy;  Laterality: N/A;  . PEG PLACEMENT N/A 06/13/2013   Procedure: PERCUTANEOUS ENDOSCOPIC GASTROSTOMY (PEG) REPLACEMENT;  Surgeon: [name], MD;  Location: [facility];  Service: Endoscopy;  Laterality: N/A;  . SPINAL FUSION    . spinal fusion to correct 70 degree kyphosis  11-2010  . spinal fusioncorrect 106 degree kyphosis    . SPINE SURGERY  ,11/20/2010, 2011   for correction of severe contracturing spinal kyphosis.   . TONSILLECTOMY     Past Medical History:  Diagnosis Date  . Cerebral palsy ([place])   . Dehydration 11/22/2013  . Depression with anxiety 08/01/2010   Qualifier: Diagnosis of  By: Nelson-Smith CMA (AAMA), Dottie    . Dyslipidemia 08/19/2017  . Esophagitis 2011  . Gastrostomy in place [facility]) 08/31/2013  . GERD (gastroesophageal reflux disease)   . Hyperlipidemia, mild 08/25/2015  . Hyperthyroidism   . Incontinence of feces   . Loss of weight 08/28/2014  .  Medicare annual wellness visit, subsequent 08/25/2015  . Mildly underweight adult 03/16/2017  . Palpitations   . Skin lesion of right ear 03/16/2017  . Thyroid disease 08/01/2010   Qualifier: Diagnosis of  By: Harlon Ditty  CMA (AAMA), Dottie     BP 119/81   Pulse (!) 105   Ht 5\' 3"  (1.6 m)   Wt 105 lb (47.6 kg)   SpO2 95%   BMI 18.60 kg/m   Opioid Risk Score:   Fall Risk Score:  `1  Depression screen PHQ 2/9  Depression screen [facility] 2/9 02/23/2018 11/22/2017  Decreased Interest 0 0  Down, Depressed, Hopeless 0 0  PHQ - 2 Score 0 0  Some recent data might be hidden     Review of Systems  Constitutional: Negative.   HENT: Negative.   Eyes: Negative.   Respiratory: Negative.   Cardiovascular: Negative.   Gastrointestinal: Negative.   Endocrine: Negative.   Genitourinary: Negative.   Musculoskeletal: Positive for myalgias.  Skin: Negative.   Allergic/Immunologic: Negative.   Neurological: Negative.   Hematological: Negative.   Psychiatric/Behavioral: The patient is nervous/anxious.   All other systems reviewed and are negative.      Objective:   Physical Exam General: No acute distress HEENT: EOMI, oral membranes moist Cards: reg rate  Chest: normal effort Abdomen: Soft, NT, ND Skin: dry, intact Extremities: no edema Skin:Clean and intact without signs of breakdown Neuro:[name] has reasonable insight and awareness. His speech remains largely unintellgibile. He has some simple movement in all four limbs but is limited by tone.     SCM 1-2/4,  Trap 1/4. Marland Kitchen Biceps are 1-2/4 bilaterally. Finger and wrist flexors 3/4 with contacture.   Quads 2-3/4 bilaterally.  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 He does sense pain in all 4 limbs.  Musculoskeletal:Full ROM, No pain with AROM or PROM in the neck, trunk, or extremities. Posture appropriate Psych: pleasant      Assessment & Plan:  1. 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  Severe dysarthria and dysphagia   Plan:  1. Will arrange for repeat botox 600 units to bilateral  Quads, left SCM/ ?trap, ?left or right elbow flexors 2. Continue with HEP.  Refilled dantrium. 3. Will see him back in about 1 months for botox injection. 15 minutes of face to face patient care time were spent during this visit. All questions were encouraged and answered.

## 2019-04-13 DIAGNOSIS — F339 Major depressive disorder, recurrent, unspecified: Secondary | ICD-10-CM | POA: Diagnosis not present

## 2019-04-18 ENCOUNTER — Ambulatory Visit: Payer: Self-pay | Admitting: Physical Medicine & Rehabilitation

## 2019-05-03 ENCOUNTER — Encounter

## 2019-05-03 ENCOUNTER — Encounter: Payer: Medicare Other | Admitting: Physical Medicine & Rehabilitation

## 2019-05-04 ENCOUNTER — Telehealth: Payer: Self-pay | Admitting: *Deleted

## 2019-05-04 DIAGNOSIS — Z20822 Contact with and (suspected) exposure to covid-19: Secondary | ICD-10-CM

## 2019-05-04 NOTE — Telephone Encounter (Signed)
[phone] testing scheduled for 7/6 at the [facility] site for 11:00a. Informed parent to wear mask and stay in vehicle.

## 2019-05-07 ENCOUNTER — Encounter: Payer: Self-pay | Admitting: Medical

## 2019-05-08 ENCOUNTER — Other Ambulatory Visit: Payer: Self-pay

## 2019-05-08 DIAGNOSIS — R6889 Other general symptoms and signs: Secondary | ICD-10-CM | POA: Diagnosis not present

## 2019-05-08 DIAGNOSIS — Z20822 Contact with and (suspected) exposure to covid-19: Secondary | ICD-10-CM

## 2019-05-13 LAB — NOVEL CORONAVIRUS, NAA: SARS-CoV-2, NAA: NOT DETECTED

## 2019-05-14 ENCOUNTER — Encounter: Payer: Self-pay | Admitting: Family Medicine

## 2019-05-23 IMAGING — XA IR REPLACE G-TUBE/COLONIC TUBE
2 series · 7 of 7 positions shown · non-contrast
Comparison: none

INDICATION: 32-year-old with cerebral palsy and chronic GJ feeding tube. The J
tube is clogged and needs replacement.

[Series 1: fl - angio · 4 of 59 frames shown]
[frame 5/59]
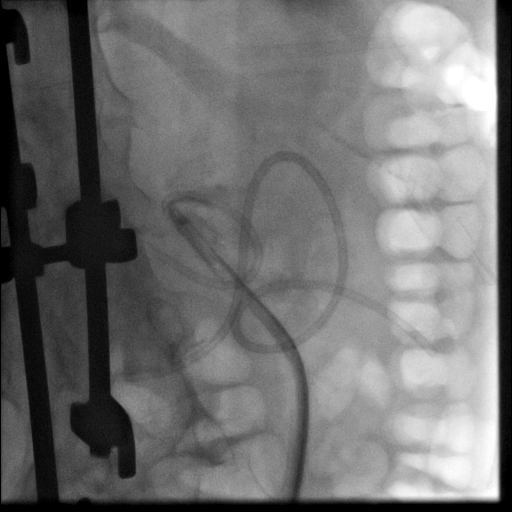
[frame 9/59]
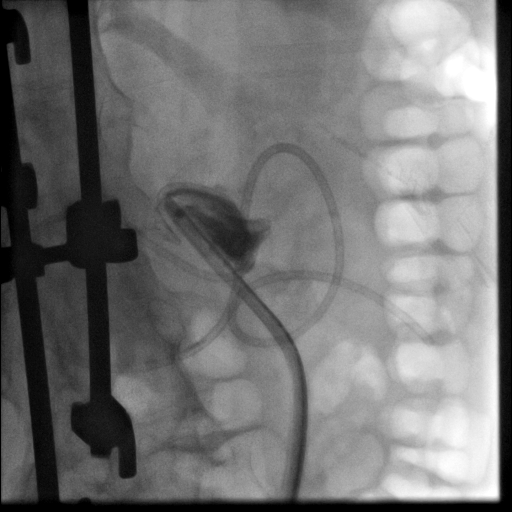
[frame 30/59]
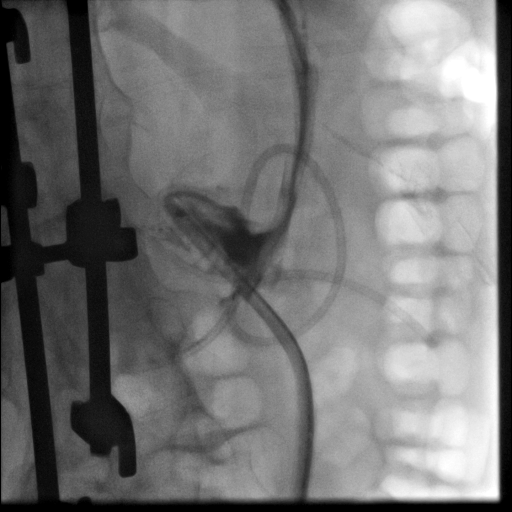
[frame 51/59]
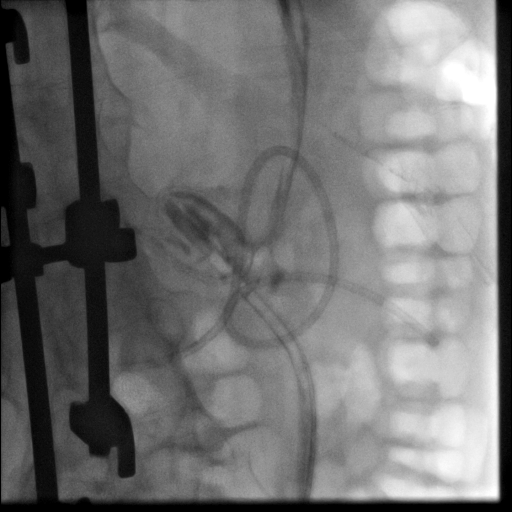

[Series 300: tube placements · 3 of 3 slices shown]
[im 1/3]
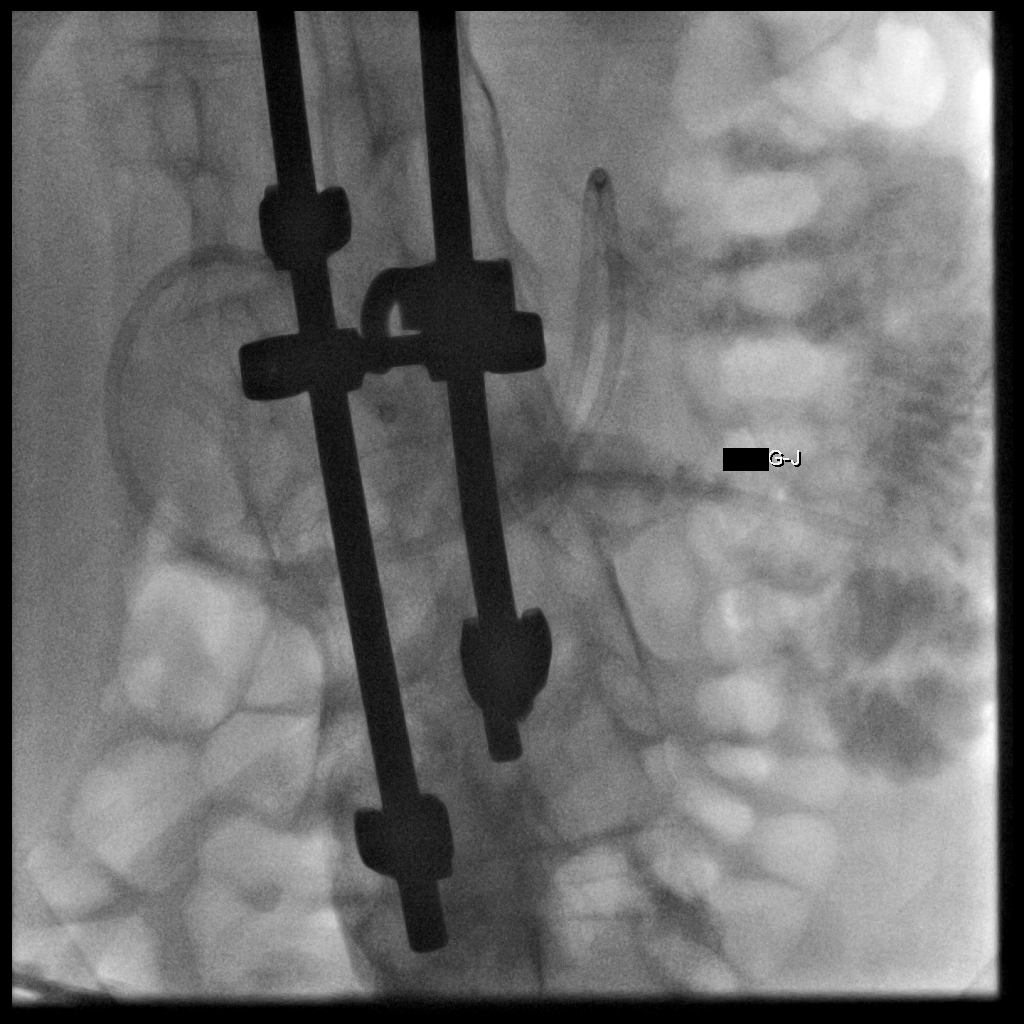
[im 2/3]
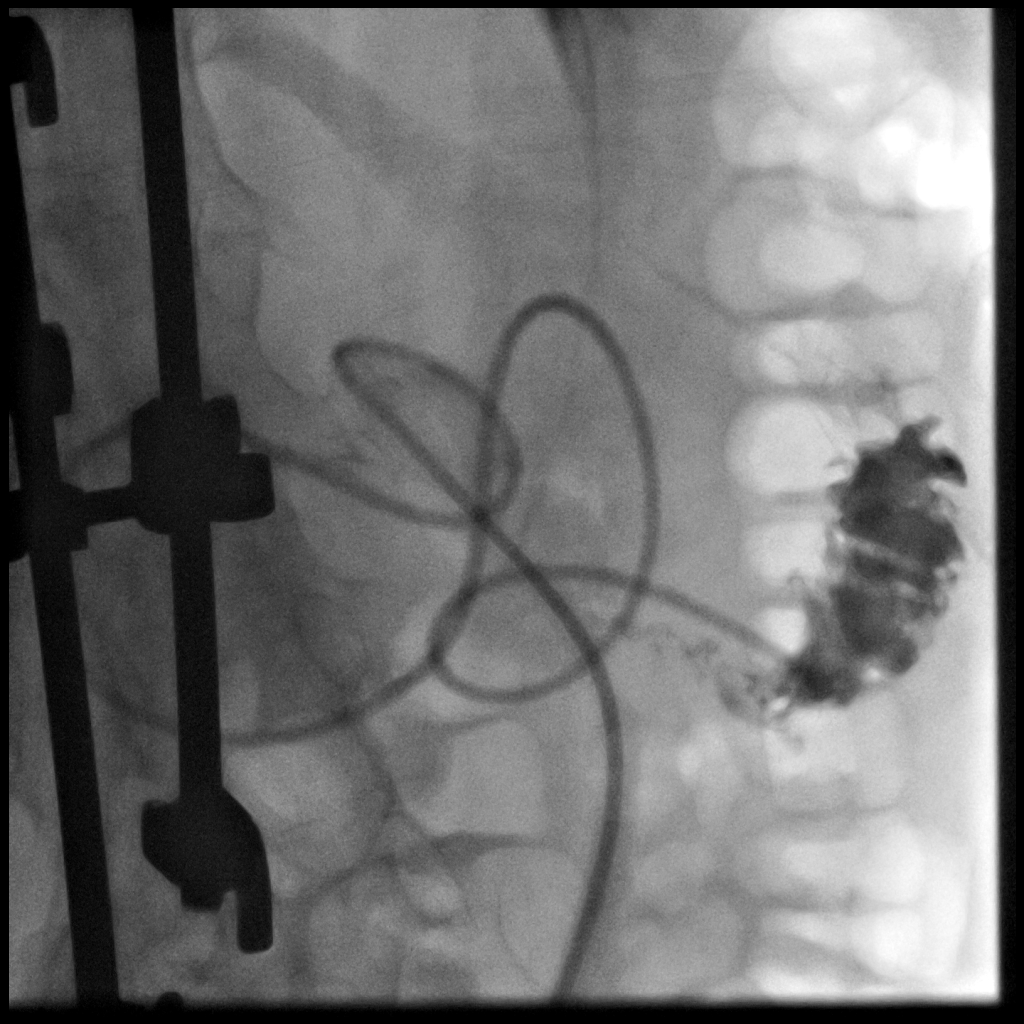
[im 3/3]
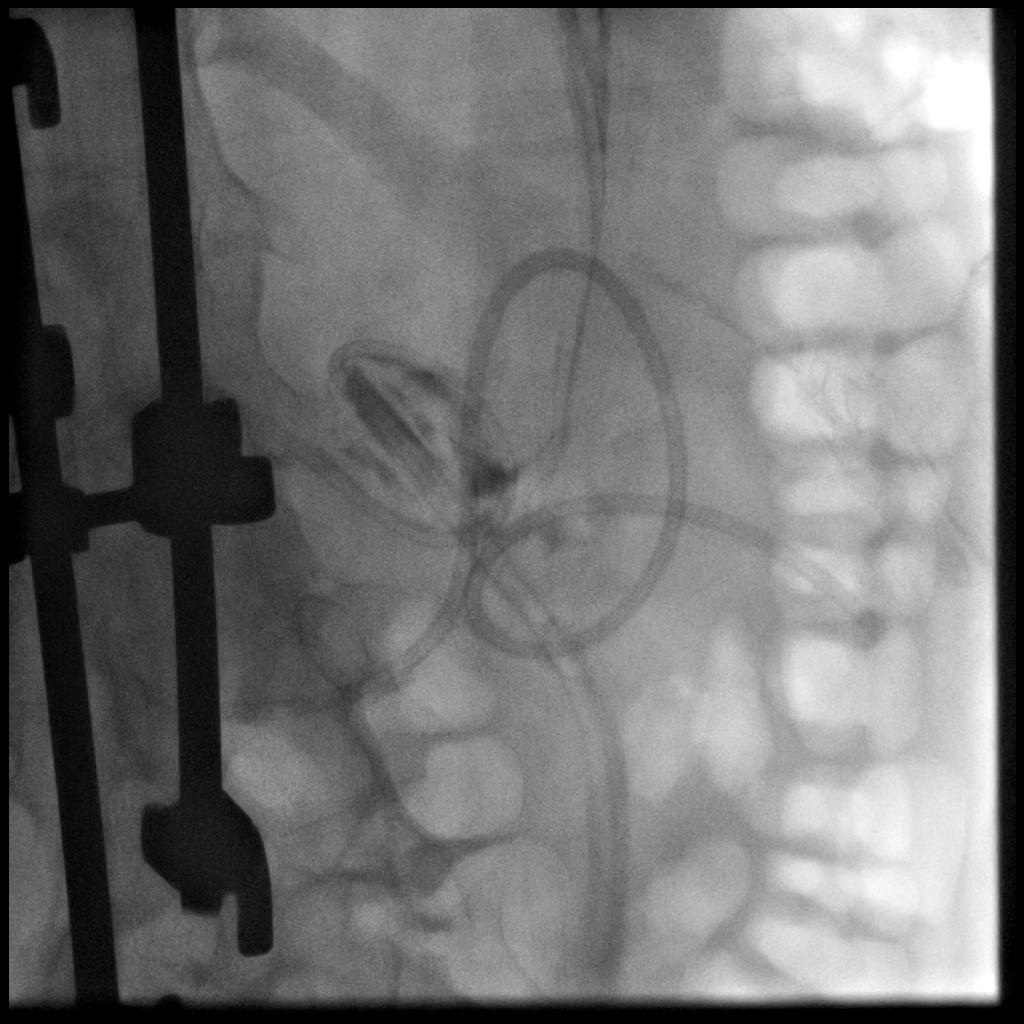

[7 of 7 positions shown; findings below may reference images not displayed]

EXAM:
EXCHANGE OF GJ FEEDING TUBE WITH FLUOROSCOPY

MEDICATIONS:
None

ANESTHESIA/SEDATION:
None

CONTRAST:  20 mL-administered into the gastric lumen.

FLUOROSCOPY TIME:  Fluoroscopy Time: 4 minutes, 18 seconds, 32 mGy

COMPLICATIONS:
None immediate.

PROCEDURE:
Patient was placed supine on the interventional table. The existing
tube was prepped and draped in sterile fashion. Maximal barrier
sterile technique was utilized including caps, mask, sterile gowns,
sterile gloves, sterile drape, hand hygiene and skin antiseptic.

The gastric lumen was injected with contrast to confirm placement in
the stomach. The balloon was deflated. The tube was partially pulled
back and the J tube was cut. Stiff Glidewire was advanced through
the J tube and the tube was removed over the wire. A Kumpe catheter
was advanced further into the small bowel and removed over a
Roadrunner wire. 22 French GJ tube was selected. The weighted tip
was cut off the J tube. Tube was advanced over the wire without
difficulty. Tube was advanced into the jejunum. Contrast injection
confirmed placement in the small bowel. The gastric balloon was
inflated with 8 mL of saline. Injection of the gastric lumen
confirmed placement in the stomach. Both lumens were flushed with
saline.

Fluoroscopic images were taken and saved for this procedure.
IMPRESSION: Successful exchange of the GJ feeding tube with fluoroscopy.

## 2019-05-30 ENCOUNTER — Other Ambulatory Visit: Payer: Self-pay

## 2019-05-30 ENCOUNTER — Encounter: Payer: Self-pay | Admitting: Physical Medicine & Rehabilitation

## 2019-05-30 ENCOUNTER — Encounter: Payer: Medicare Other | Attending: Physical Medicine & Rehabilitation | Admitting: Physical Medicine & Rehabilitation

## 2019-05-30 VITALS — BP 109/62 | HR 90 | Temp 97.7°F

## 2019-05-30 DIAGNOSIS — G8250 Quadriplegia, unspecified: Secondary | ICD-10-CM | POA: Diagnosis not present

## 2019-05-30 DIAGNOSIS — G243 Spasmodic torticollis: Secondary | ICD-10-CM

## 2019-05-30 NOTE — Patient Instructions (Signed)
PLEASE FEEL FREE TO CALL OUR OFFICE WITH ANY PROBLEMS OR QUESTIONS ([phone])      

## 2019-05-30 NOTE — Progress Notes (Signed)
Botox Injection for spasticity using needle EMG guidance Indication: Cervical dystonia -  Spastic tetraplegia (HCC) -   Dilution: 100 Units/ml        Total Units Injected: 600 Indication: Severe spasticity which interferes with ADL,mobility and/or  hygiene and is unresponsive to medication management and other conservative care Informed consent was obtained after describing risks and benefits of the procedure with the patient. This includes bleeding, bruising, infection, excessive weakness, or medication side effects. A REMS form is on file and signed.  Needle: 33mm injectable monopolar needle electrode  Number of units per muscle Left SCM: 100u 2 access points Left trap 100 u 2 access points Pectoralis Major - units Pectoralis Minor - units Biceps - units Brachioradialis - units FCR - units FCU - units FDS - units FDP - units FPL - units Pronator Teres - units Pronator Quadratus - units Quadriceps 200 units 4 acces points right and 200u 4 access points left Gastroc/soleus  units Hamstrings - units Tibialis Posterior - units EHL - units All injections were done after obtaining appropriate EMG activity and after negative drawback for blood. The patient tolerated the procedure well. Post procedure instructions were given. Return in about 3 months (around 08/30/2019) for repeat botox 600u vs observation.

## 2019-06-07 ENCOUNTER — Ambulatory Visit: Payer: Medicare Other | Admitting: Physical Medicine & Rehabilitation

## 2019-06-08 ENCOUNTER — Telehealth: Payer: Self-pay | Admitting: Family Medicine

## 2019-06-08 NOTE — Telephone Encounter (Signed)
Received paperwork for patient requesting a MASK. Per Dr. Charlett Blake she wants to know from mom what type of mask does patient need and if mom wants him to have this MASK. Left mom detailed message

## 2019-06-08 NOTE — Telephone Encounter (Signed)
Pt's mother stated the masks are medical masks, she doesn't have specific name and she does want to get them. Please advise.

## 2019-06-09 NOTE — Telephone Encounter (Signed)
Noted , will shred order form

## 2019-06-29 ENCOUNTER — Other Ambulatory Visit: Payer: Self-pay

## 2019-06-29 ENCOUNTER — Ambulatory Visit (INDEPENDENT_AMBULATORY_CARE_PROVIDER_SITE_OTHER): Payer: Medicare Other | Admitting: Family Medicine

## 2019-06-29 DIAGNOSIS — E079 Disorder of thyroid, unspecified: Secondary | ICD-10-CM

## 2019-06-29 DIAGNOSIS — Z79899 Other long term (current) drug therapy: Secondary | ICD-10-CM

## 2019-06-29 DIAGNOSIS — E785 Hyperlipidemia, unspecified: Secondary | ICD-10-CM | POA: Diagnosis not present

## 2019-06-29 DIAGNOSIS — R946 Abnormal results of thyroid function studies: Secondary | ICD-10-CM

## 2019-06-29 DIAGNOSIS — K219 Gastro-esophageal reflux disease without esophagitis: Secondary | ICD-10-CM | POA: Diagnosis not present

## 2019-07-02 NOTE — Assessment & Plan Note (Signed)
Monitor

## 2019-07-02 NOTE — Assessment & Plan Note (Signed)
On PEG tube feedings, stable

## 2019-07-02 NOTE — Progress Notes (Signed)
Subjective:    Patient ID: [name], male    DOB: 1986/01/06, 33 y.o.   MRN: [id number]  No chief complaint on file.   HPI Patient is in today for follow up on his cerebral palsy, reflux and more. He lives with his mother and she reports he is doing well. She provides the history as he is unable to speak. No recent febrile illness or hospitalizations. No fevers or chills. Denies CP/palp/SOB/HA/congestion/fevers/GI or GU c/o. Taking meds as prescribed  Past Medical History:  Diagnosis Date  . Cerebral palsy ([place])   . Dehydration 11/22/2013  . Depression with anxiety 08/01/2010   Qualifier: Diagnosis of  By: Nelson-Smith CMA (AAMA), Dottie    . Dyslipidemia 08/19/2017  . Esophagitis 2011  . Gastrostomy in place [facility]) 08/31/2013  . GERD (gastroesophageal reflux disease)   . Hyperlipidemia, mild 08/25/2015  . Hyperthyroidism   . Incontinence of feces   . Loss of weight 08/28/2014  . Medicare annual wellness visit, subsequent 08/25/2015  . Mildly underweight adult 03/16/2017  . Palpitations   . Skin lesion of right ear 03/16/2017  . Thyroid disease 08/01/2010   Qualifier: Diagnosis of  By: Harlon Ditty CMA (AAMA), Dottie      Past Surgical History:  Procedure Laterality Date  . baclofen trial    . baslofen pump implant    . ears tubes    . EYE SURGERY    . FLEXIBLE SIGMOIDOSCOPY N/A 09/07/2014   Procedure: FLEXIBLE SIGMOIDOSCOPY;  Surgeon: [name], MD;  Location: [facility] ENDOSCOPY;  Service: Endoscopy;  Laterality: N/A;  . g-tube insert  August 2006  . hamstring released     to treat contractures.   Marland Kitchen HIP SURGERY     x2 , side   . IR CM INJ ANY COLONIC TUBE W/FLUORO  05/28/2017  . IR GASTR TUBE CONVERT GASTR-JEJ PER W/FL MOD SED  03/14/2019  . IR GENERIC HISTORICAL  07/01/2016   IR GASTR TUBE CONVERT GASTR-JEJ PER W/FL MOD SED 07/01/2016 [name], MD [facility]-INTERV RAD  . IR GENERIC HISTORICAL  07/08/2016   IR PATIENT EVAL TECH 0-60 MINS 07/08/2016 [name], MD  [facility]  . IR GENERIC HISTORICAL  07/14/2016   IR GJ TUBE CHANGE 07/14/2016 [name], MD [facility]  . IR GENERIC HISTORICAL  07/21/2016   IR PATIENT EVAL TECH 0-60 MINS [facility]  . IR GENERIC HISTORICAL  08/31/2016   IR [place] DUODEN/JEJUNO TUBE PERCUT W/FLUORO 08/31/2016 [name], MD [facility]  . IR GENERIC HISTORICAL  09/03/2016   IR GJ TUBE CHANGE 09/03/2016 [name], MD [facility]  . IR GENERIC HISTORICAL  09/10/2016   IR GASTR TUBE CONVERT GASTR-JEJ PER W/FL MOD SED 09/10/2016 [facility]  . IR GENERIC HISTORICAL  09/16/2016   IR PATIENT EVAL TECH 0-60 MINS [facility]  . IR GENERIC HISTORICAL  09/29/2016   IR GJ TUBE CHANGE 09/29/2016 [name], MD [facility]  . IR GJ TUBE CHANGE  02/05/2017  . IR GJ TUBE CHANGE  05/21/2017  . IR GJ TUBE CHANGE  08/25/2017  . IR GJ TUBE CHANGE  01/18/2018  . IR GJ TUBE CHANGE  02/04/2018  . IR GJ TUBE CHANGE  04/21/2018  . IR GJ TUBE CHANGE  08/18/2018  . IR MECH REMOV OBSTRUC MAT ANY COLON TUBE W/FLUORO  09/02/2018  . IR REPLC GASTRO/COLONIC TUBE PERCUT W/FLUORO  10/17/2018  . PEG PLACEMENT  10/21/2011   Procedure: PERCUTANEOUS ENDOSCOPIC GASTROSTOMY (PEG) REPLACEMENT;  Surgeon:  [name], MD;  Location: [facility];  Service: Endoscopy;  Laterality: N/A;  . PEG PLACEMENT N/A 06/13/2013   Procedure: PERCUTANEOUS ENDOSCOPIC GASTROSTOMY (PEG) REPLACEMENT;  Surgeon: [name], MD;  Location: [facility] ENDOSCOPY;  Service: Endoscopy;  Laterality: N/A;  . SPINAL FUSION    . spinal fusion to correct 70 degree kyphosis  11-2010  . spinal fusioncorrect 106 degree kyphosis    . SPINE SURGERY  ,11/20/2010, 2011   for correction of severe contracturing spinal kyphosis.   . TONSILLECTOMY      Family History  Problem Relation Age of Onset  . Asthma Mother   . Hyperlipidemia Mother   . COPD Mother   . Other Mother        bronchial stasis/ABPA  . Cancer Maternal Grandmother 13       breast  . Hyperlipidemia Maternal  Grandmother   . Hypertension Maternal Grandmother   . Cancer Maternal Grandfather        prostate  . Heart disease Paternal Grandfather        CHF  . Osteoporosis Paternal Grandmother   . Arthritis Paternal Grandmother        rheumatoid    Social History   Socioeconomic History  . Marital status: Single    Spouse name: Not on file  . Number of children: 0  . Years of education: Not on file  . Highest education level: Not on file  Occupational History  . Occupation: disbaled  Social Needs  . Financial resource strain: Not on file  . Food insecurity    Worry: Not on file    Inability: Not on file  . Transportation needs    Medical: Not on file    Non-medical: Not on file  Tobacco Use  . Smoking status: Never Smoker  . Smokeless tobacco: Never Used  Substance and Sexual Activity  . Alcohol use: No  . Drug use: No  . Sexual activity: Never  Lifestyle  . Physical activity    Days per week: Not on file    Minutes per session: Not on file  . Stress: Not on file  Relationships  . Social Herbalist on phone: Not on file    Gets together: Not on file    Attends religious service: Not on file    Active member of club or organization: Not on file    Attends meetings of clubs or organizations: Not on file    Relationship status: Not on file  . Intimate partner violence    Fear of current or ex partner: Not on file    Emotionally abused: Not on file    Physically abused: Not on file    Forced sexual activity: Not on file  Other Topics Concern  . Not on file  Social History Narrative  . Not on file    Outpatient Medications Prior to Visit  Medication Sig Dispense Refill  . AMBULATORY NON FORMULARY MEDICATION Medication Name: MIC gastrostomy/bolus feeding tube 24 French Part number 0110-24. #2 and 10 cc lurer lock syringe #2 Dx: 4 Device 2  . bacitracin 500 UNIT/GM ointment Apply 1 application topically 2 (two) times daily. 30 g 1  .  clotrimazole-betamethasone (LOTRISONE) cream Apply 1 application topically 2 (two) times daily. 45 g 1  . dantrolene (DANTRIUM) 50 MG capsule Take 1 capsule (50 mg total) by mouth 3 (three) times daily. 90 capsule 6  . divalproex (DEPAKOTE SPRINKLE) 125 MG capsule Take 2 capsules in  morning, 5 capsules at bedtime (Patient taking differently: 250-625 mg. Take 2 capsules in morning, 5 capsules at bedtime) 210 capsule 4  . Incontinence Supply Disposable (PREVAIL BREEZERS MEDIUM) MISC pkg of 16- size medium 32" to 44"  Breathable cloth-like outer fabric (can't use the plastic outer surgace  Item # [id number] 16 each 6  . LORazepam (ATIVAN) 1 MG tablet Take one tablet in evenings as needed for insomnia. (Patient taking differently: Take one tablet in evenings for insomnia.) 30 tablet 1  . Misc. Devices (ALL-BODY MASSAGE) MISC 1 Units/hr by Does not apply route as needed. Full body massage for Muscle spasticity due to Cerebral palsy 99 each 99  . mupirocin ointment (BACTROBAN) 2 % Apply to area thin film twice daily if needed 22 g 0  . NON FORMULARY Bard Leg Bag Extension tubing w/Connector 18", Sterile, latex-free  Item# [id number]    . NON FORMULARY Colorplast Freedom Cath Latex Self-Adhering Male External Catheter 57mm Diameter Intermediate  Item# [id number]    . NONFORMULARY OR COMPOUNDED ITEM Covidien REF [id number] - [place]oo Joey Pump Set with Flush Bags - 1000 mL 1 each 0  . Nutritional Supplements (FEEDING SUPPLEMENT, JEVITY 1.5 CAL,) LIQD Run tube feed at 80 mL per hour over 15 hours. (3 cartons) 80 mL 11  . Nutritional Supplements (FEEDING SUPPLEMENT, VITAL 1.5 CAL,) LIQD Place 80 mLs into feeding tube continuous. 2 cartons 80 mL 11  . nystatin (MYCOSTATIN) 100000 UNIT/ML suspension 5 ml per tube qid 60 mL 0  . OLANZapine (ZYPREXA) 2.5 MG tablet Place 5 mg into feeding tube 2 (two) times daily.   3  . Omeprazole-Sodium Bicarbonate (ZEGERID) 20-1100 MG CAPS capsule Take 1 capsule by mouth daily before  breakfast.    . Ostomy Supplies (PROTECTIVE BARRIER WIPES) MISC 1-1/4" X 3"  Item ID:[id number] 75 each 6  . PARoxetine (PAXIL) 10 MG tablet Take 15 mg by mouth daily.    . sucralfate (CARAFATE) 1 g tablet Take 1 tablet (1 g total) by mouth 4 (four) times daily -  with meals and at bedtime. 120 tablet 5   No facility-administered medications prior to visit.     Allergies  Allergen Reactions  . Ambien [Zolpidem Tartrate] Nausea Only  . Antihistamines, Chlorpheniramine-Type     Other reaction(s): Other (See Comments) Other Reaction: agitation  . Codeine Other (See Comments)    Makes patient too active after a few days.  . Metoclopramide Other (See Comments)    Delusion, emotionality   . Baclofen Anxiety  . Pheniramine Rash    Other reaction(s): Other (See Comments) Other Reaction: agitation  . Sulfa Antibiotics Rash  . Sulfonamide Derivatives Rash    Review of Systems  Constitutional: Negative for fever and malaise/fatigue.  HENT: Negative for congestion.   Eyes: Negative for blurred vision.  Respiratory: Negative for shortness of breath.   Cardiovascular: Negative for chest pain, palpitations and leg swelling.  Gastrointestinal: Negative for abdominal pain, blood in stool and nausea.  Genitourinary: Negative for dysuria and frequency.  Musculoskeletal: Negative for falls.  Skin: Negative for rash.  Neurological: Negative for dizziness, loss of consciousness and headaches.  Endo/Heme/Allergies: Negative for environmental allergies.  Psychiatric/Behavioral: Negative for depression. The patient is not nervous/anxious.        Objective:    Physical Exam unable to obtain as patient was not with his mother at the time of the interview.   There were no vitals taken for this visit. Wt Readings from Last 3 Encounters:  04/12/19  105 lb (47.6 kg)  01/17/19 103 lb (46.7 kg)  12/13/18 103 lb (46.7 kg)    Diabetic Foot Exam - Simple   No data filed     Lab Results  Component  Value Date   WBC 3.7 (L) 12/01/2018   HGB 15.1 12/01/2018   HCT 43.6 12/01/2018   PLT 132.0 (L) 12/01/2018   GLUCOSE 67 (L) 08/16/2018   CHOL 119 08/16/2018   TRIG 78.0 08/16/2018   HDL 32.70 (L) 08/16/2018   LDLCALC 70 08/16/2018   ALT 31 08/16/2018   AST 21 08/16/2018   NA 138 08/16/2018   K 4.1 08/16/2018   CL 101 08/16/2018   CREATININE 0.34 (L) 08/16/2018   BUN 8 08/16/2018   CO2 28 08/16/2018   TSH 2.22 08/16/2018   HGBA1C 5.0 04/29/2007    Lab Results  Component Value Date   TSH 2.22 08/16/2018   Lab Results  Component Value Date   WBC 3.7 (L) 12/01/2018   HGB 15.1 12/01/2018   HCT 43.6 12/01/2018   MCV 91.2 12/01/2018   PLT 132.0 (L) 12/01/2018   Lab Results  Component Value Date   NA 138 08/16/2018   K 4.1 08/16/2018   CO2 28 08/16/2018   GLUCOSE 67 (L) 08/16/2018   BUN 8 08/16/2018   CREATININE 0.34 (L) 08/16/2018   BILITOT 0.4 08/16/2018   ALKPHOS 90 08/16/2018   AST 21 08/16/2018   ALT 31 08/16/2018   PROT 7.4 08/16/2018   ALBUMIN 4.3 08/16/2018   CALCIUM 9.3 08/16/2018   ANIONGAP 11 09/18/2014   GFR 314.92 08/16/2018   Lab Results  Component Value Date   CHOL 119 08/16/2018   Lab Results  Component Value Date   HDL 32.70 (L) 08/16/2018   Lab Results  Component Value Date   LDLCALC 70 08/16/2018   Lab Results  Component Value Date   TRIG 78.0 08/16/2018   Lab Results  Component Value Date   CHOLHDL 4 08/16/2018   Lab Results  Component Value Date   HGBA1C 5.0 04/29/2007       Assessment & Plan:   Problem List Items Addressed This Visit    Thyroid disease    Monitor       GERD   Relevant Orders   Comprehensive metabolic panel   CBC with Differential/Platelet   Abnormal thyroid function test - Primary   Relevant Orders   TSH   Chronic GERD    Mom notes he has been having more gurgling lately.  She is concerned his PEG tube has moved out of place if it gets worse she will present to ER for evaluation.        Hyperlipidemia, mild    On PEG tube feedings, stable      RESOLVED: Dyslipidemia   Relevant Orders   Lipid panel      I am having Sherwood S. Brunker "[name]" maintain his divalproex, clotrimazole-betamethasone, Omeprazole-Sodium Bicarbonate, NON FORMULARY, NON FORMULARY, Protective Barrier Wipes, Prevail Breezers Medium, bacitracin, NONFORMULARY OR COMPOUNDED ITEM, LORazepam, PARoxetine, AMBULATORY NON FORMULARY MEDICATION, OLANZapine, mupirocin ointment, feeding supplement (JEVITY 1.5 CAL), feeding supplement (VITAL 1.5 CAL), sucralfate, nystatin, All-Body Massage, and dantrolene.  No orders of the defined types were placed in this encounter.    [name], MD

## 2019-07-02 NOTE — Assessment & Plan Note (Signed)
Mom notes he has been having more gurgling lately.  She is concerned his PEG tube has moved out of place if it gets worse she will present to ER for evaluation.

## 2019-07-06 ENCOUNTER — Other Ambulatory Visit (HOSPITAL_COMMUNITY): Payer: Self-pay | Admitting: Interventional Radiology

## 2019-07-06 DIAGNOSIS — R633 Feeding difficulties, unspecified: Secondary | ICD-10-CM

## 2019-07-07 ENCOUNTER — Other Ambulatory Visit: Payer: Self-pay

## 2019-07-07 ENCOUNTER — Ambulatory Visit (HOSPITAL_COMMUNITY)
Admission: RE | Admit: 2019-07-07 | Discharge: 2019-07-07 | Disposition: A | Discharge status: Home / Self Care | Payer: Medicare Other | Source: Ambulatory Visit | Attending: Interventional Radiology | Admitting: Interventional Radiology

## 2019-07-07 ENCOUNTER — Encounter (HOSPITAL_COMMUNITY): Payer: Self-pay | Admitting: Diagnostic Radiology

## 2019-07-07 DIAGNOSIS — Z431 Encounter for attention to gastrostomy: Secondary | ICD-10-CM | POA: Insufficient documentation

## 2019-07-07 DIAGNOSIS — R633 Feeding difficulties, unspecified: Secondary | ICD-10-CM

## 2019-07-07 HISTORY — PX: IR CM INJ ANY COLONIC TUBE W/FLUORO: IMG2336

## 2019-07-07 IMAGING — DX DG CHEST 2V
2 series · 2 of 2 positions shown · non-contrast
Comparison: January 21, 2018

CLINICAL DATA: Fever and cough for 6 weeks

EXAM:
CHEST - 2 VIEW

[chest lat]
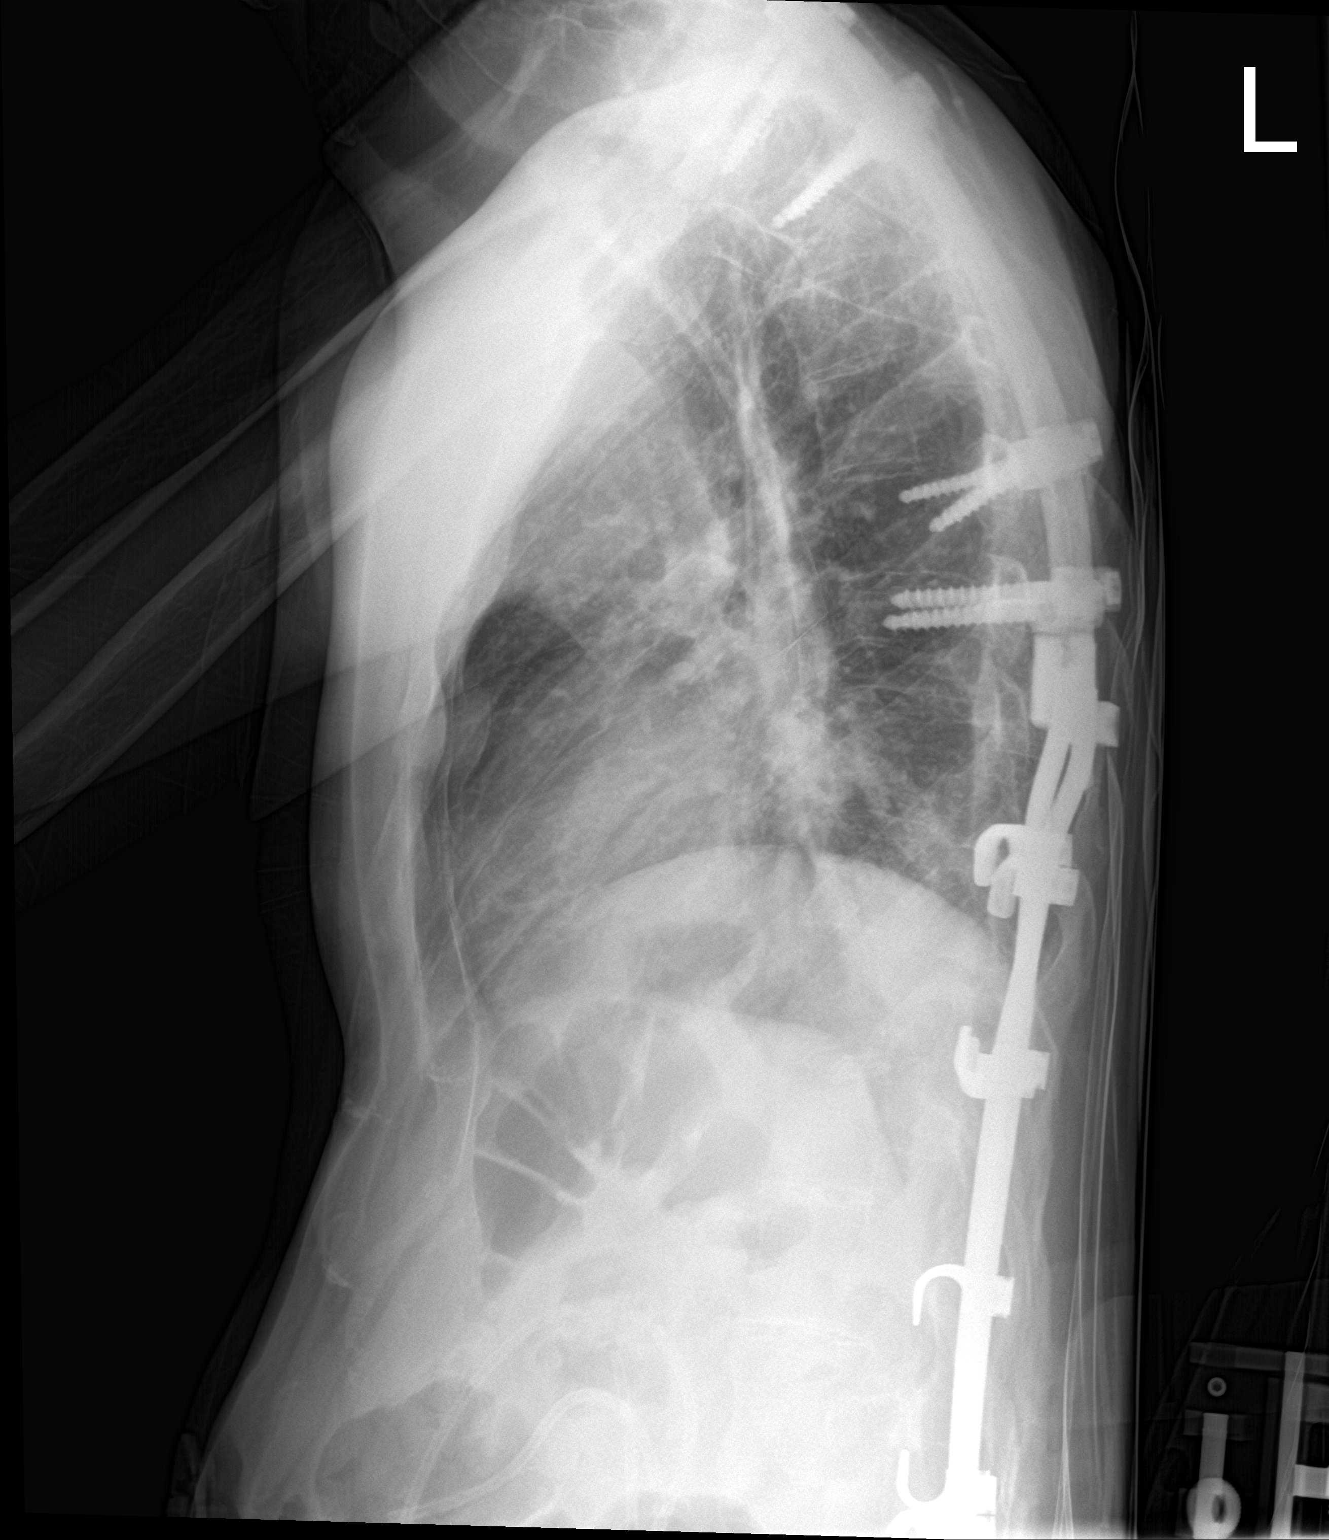

[chest ap]
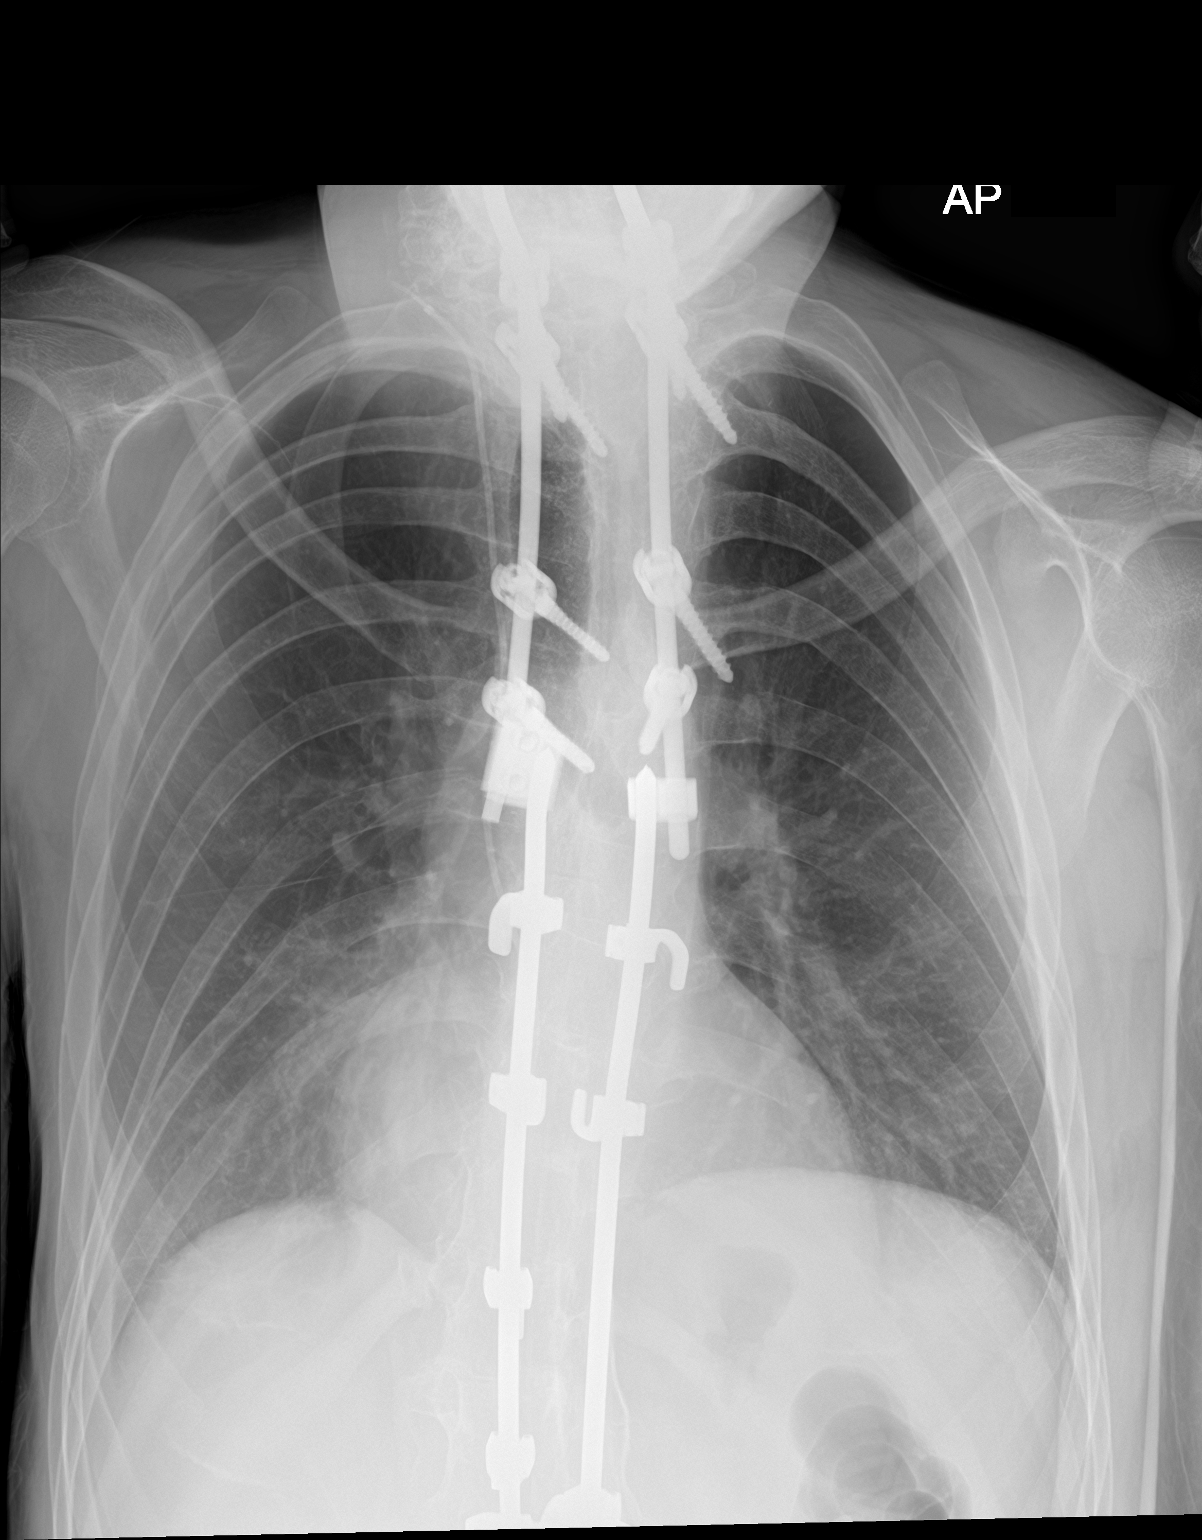

[2 of 2 positions shown; findings below may reference images not displayed]

FINDINGS: The heart size and mediastinal contours are within normal limits.
Both lungs are clear. Spinal rods are identified.
IMPRESSION: No focal pneumonia identified.

## 2019-07-07 MED ORDER — IOHEXOL 300 MG/ML  SOLN
50.0000 mL | Freq: Once | INTRAMUSCULAR | Status: AC | PRN
  Administered 2019-07-07: 12 mL

## 2019-07-11 ENCOUNTER — Telehealth: Payer: Self-pay | Admitting: Internal Medicine

## 2019-07-11 DIAGNOSIS — H1033 Unspecified acute conjunctivitis, bilateral: Secondary | ICD-10-CM | POA: Diagnosis not present

## 2019-07-11 NOTE — Telephone Encounter (Signed)
Pt's mother called to inform that pt has been having GERD symptoms and coughing.  She stated that it has been hard to feed them through G tube.  Please advise.

## 2019-07-12 ENCOUNTER — Other Ambulatory Visit: Payer: Self-pay | Admitting: Internal Medicine

## 2019-07-12 NOTE — Telephone Encounter (Signed)
Mother states there have been some changes with [name]. He is having more secretions and they have not been able to do night time feeds for a while. Gurgly cough and esophageal spasms at times. [facility] RN states lungs are clear, IR check tube and placement is good. Mother states over the weekend she started giving him carafate. Last night they were able to run the feedings at 30cc/hr and he tolerated them. Stated it has been several weeks since he had been able to do that. Discussed with her to continue giving him the carafate qid and he has also been taking the zegerid. He has improved on the carafate, mother wanted to know if Dr. Hilarie Fredrickson had any other recommendations. She knows he is currently out of the office, will call her back when he returns.

## 2019-07-17 ENCOUNTER — Encounter: Payer: Self-pay | Admitting: Family Medicine

## 2019-07-19 NOTE — Telephone Encounter (Signed)
Sorry about the delay as I was out of the office  I am certainly okay for her to continue the Zegerid and also the Carafate.   Given the passage of time please see if they are able to maintain tube feedings at goal at present Thank you

## 2019-07-19 NOTE — Telephone Encounter (Signed)
Left message for mother regarding Dr. Vena Rua recommendations. Requested she call back with any questions or concerns.

## 2019-07-21 NOTE — Telephone Encounter (Signed)
Spoke with patients mother regarding antibiotic.  Mother states coughing/gagging have improved slightly. Continues to hold night feeds.  She has contacted the dietician with [facility] who will be sending samples of plant based formulas to try.  Mother would like to hold off on antibiotic until after trying the new formula. Advised to call with updates and any issues.

## 2019-07-24 ENCOUNTER — Encounter: Payer: Self-pay | Admitting: *Deleted

## 2019-07-24 NOTE — Telephone Encounter (Signed)
Patient mom stated that she spoke with someone on Friday.  She feels like patient does not need antibiotic.  She spoke with Nurse at [facility] and they are going to give him some samples of plant based formula

## 2019-07-24 NOTE — Telephone Encounter (Signed)
Patient stated that she has talked with someone on Friday.  She stated that she feels that he does not need to be on an antibiotic because his lungs are clear.  She spoke with a nurse from [facility] who is going to bring some samples of plant based formula to see if that will help with the secretions, this has  Happened before with another patient.

## 2019-08-07 ENCOUNTER — Telehealth: Payer: Self-pay

## 2019-08-07 NOTE — Telephone Encounter (Signed)
Copied from [place] [id number]. Topic: General - Other >> Aug 07, 2019 11:59 AM Percell Belt A wrote: Reason for CRM:  Josh from [place] [facility], they had faxed over renewal paperwork for pt Catheter.  He would like to know if this has been rec'd. Return fax number  [phone] Return phone number  [phone]

## 2019-08-08 NOTE — Telephone Encounter (Signed)
Completed paperwork and forwarded to PCP for review

## 2019-08-08 NOTE — Telephone Encounter (Signed)
Have not seen paperwork as of yet

## 2019-08-15 DIAGNOSIS — F339 Major depressive disorder, recurrent, unspecified: Secondary | ICD-10-CM | POA: Diagnosis not present

## 2019-08-16 DIAGNOSIS — N1330 Unspecified hydronephrosis: Secondary | ICD-10-CM | POA: Diagnosis not present

## 2019-08-16 DIAGNOSIS — R350 Frequency of micturition: Secondary | ICD-10-CM | POA: Diagnosis not present

## 2019-08-30 ENCOUNTER — Encounter: Payer: Self-pay | Admitting: Physical Medicine & Rehabilitation

## 2019-08-30 ENCOUNTER — Encounter: Payer: Medicare Other | Admitting: Physical Medicine & Rehabilitation

## 2019-08-30 ENCOUNTER — Other Ambulatory Visit: Payer: Self-pay

## 2019-08-30 VITALS — BP 107/72 | HR 118 | Temp 100.7°F | Ht 60.0 in | Wt 105.0 lb

## 2019-08-30 NOTE — Progress Notes (Signed)
Visit cancelled.

## 2019-09-05 NOTE — Telephone Encounter (Signed)
Cary from [facility] called in checking on status of forms stating they still have not received them. Please advise and fax back to [phone].

## 2019-09-08 NOTE — Telephone Encounter (Signed)
I have looked for the form and have not yet located it

## 2019-09-11 ENCOUNTER — Other Ambulatory Visit (HOSPITAL_COMMUNITY): Payer: Self-pay | Admitting: Radiology

## 2019-09-11 DIAGNOSIS — R633 Feeding difficulties, unspecified: Secondary | ICD-10-CM

## 2019-09-12 ENCOUNTER — Encounter (HOSPITAL_COMMUNITY): Payer: Self-pay | Admitting: Interventional Radiology

## 2019-09-12 ENCOUNTER — Ambulatory Visit (HOSPITAL_COMMUNITY)
Admission: RE | Admit: 2019-09-12 | Discharge: 2019-09-12 | Disposition: A | Discharge status: Home / Self Care | Payer: Medicare Other | Source: Ambulatory Visit | Attending: Radiology | Admitting: Radiology

## 2019-09-12 ENCOUNTER — Other Ambulatory Visit: Payer: Self-pay

## 2019-09-12 DIAGNOSIS — R633 Feeding difficulties, unspecified: Secondary | ICD-10-CM

## 2019-09-12 DIAGNOSIS — Z431 Encounter for attention to gastrostomy: Secondary | ICD-10-CM | POA: Diagnosis not present

## 2019-09-12 HISTORY — PX: IR GJ TUBE CHANGE: IMG1440

## 2019-09-12 MED ORDER — IOHEXOL 300 MG/ML  SOLN
50.0000 mL | Freq: Once | INTRAMUSCULAR | Status: AC | PRN
  Administered 2019-09-12: 20 mL

## 2019-09-12 NOTE — Procedures (Signed)
Pre procedural Dx: Dysphagia, poorly functioning feeding tube. Post procedural Dx: Same  Successful fluoroscopic guided replacement of exisitng 24 Fr feeding gastrojejunostomy tube.   The feeding tube is ready for immediate use.  EBL: None  Complications: None immediate.  Ronny Bacon, MD Pager #: [phone]

## 2019-09-13 ENCOUNTER — Encounter: Payer: Medicare Other | Attending: Physical Medicine & Rehabilitation | Admitting: Physical Medicine & Rehabilitation

## 2019-09-13 ENCOUNTER — Other Ambulatory Visit: Payer: Self-pay

## 2019-09-13 ENCOUNTER — Encounter: Payer: Self-pay | Admitting: Physical Medicine & Rehabilitation

## 2019-09-13 VITALS — BP 109/73 | HR 79

## 2019-09-13 DIAGNOSIS — G8250 Quadriplegia, unspecified: Secondary | ICD-10-CM | POA: Diagnosis not present

## 2019-09-13 NOTE — Progress Notes (Signed)
Botox Injection for spasticity using needle EMG guidance Indication: Spastic tetraplegia (HCC)   Dilution: 100 Units/ml        Total Units Injected: 600 Indication: Severe spasticity which interferes with ADL,mobility and/or  hygiene and is unresponsive to medication management and other conservative care Informed consent was obtained after describing risks and benefits of the procedure with the patient. This includes bleeding, bruising, infection, excessive weakness, or medication side effects. A REMS form is on file and signed.  Needle: 67mm injectable monopolar needle electrode  Number of units per muscle Left SCM: 50 units Left trap: 50 units Pectoralis Major 0 units Pectoralis Minor 0 units Biceps 0 units Brachioradialis 0 units FCR 0 units FCU 0 units FDS 0 units FDP 0 units FPL 0 units Pronator Teres 0 units Pronator Quadratus 0 units Quadriceps 500 units divided between both lower ext Gastroc/soleus 0 units Hamstrings 0 units Tibialis Posterior 0 units EHL 0 units All injections were done after obtaining appropriate EMG activity and after negative drawback for blood. The patient tolerated the procedure well. Post procedure instructions were given. Return in about 3 months (around 12/14/2019) for f/u botox 600 units.

## 2019-09-18 ENCOUNTER — Other Ambulatory Visit: Payer: Self-pay

## 2019-09-18 ENCOUNTER — Other Ambulatory Visit (HOSPITAL_COMMUNITY): Payer: Self-pay | Admitting: Interventional Radiology

## 2019-09-18 ENCOUNTER — Encounter (HOSPITAL_COMMUNITY): Payer: Self-pay | Admitting: Interventional Radiology

## 2019-09-18 ENCOUNTER — Ambulatory Visit (HOSPITAL_COMMUNITY)
Admission: RE | Admit: 2019-09-18 | Discharge: 2019-09-18 | Disposition: A | Discharge status: Home / Self Care | Payer: Medicare Other | Source: Ambulatory Visit | Attending: Interventional Radiology | Admitting: Interventional Radiology

## 2019-09-18 DIAGNOSIS — Z431 Encounter for attention to gastrostomy: Secondary | ICD-10-CM | POA: Diagnosis not present

## 2019-09-18 DIAGNOSIS — R633 Feeding difficulties, unspecified: Secondary | ICD-10-CM

## 2019-09-18 HISTORY — PX: IR CM INJ ANY COLONIC TUBE W/FLUORO: IMG2336

## 2019-09-18 MED ORDER — IOHEXOL 300 MG/ML  SOLN
50.0000 mL | Freq: Once | INTRAMUSCULAR | Status: AC | PRN
  Administered 2019-09-18: 10 mL

## 2019-09-26 ENCOUNTER — Telehealth: Payer: Self-pay | Admitting: *Deleted

## 2019-09-26 DIAGNOSIS — Z23 Encounter for immunization: Secondary | ICD-10-CM | POA: Diagnosis not present

## 2019-09-26 NOTE — Telephone Encounter (Signed)
Copied from [place] [id number]. Topic: General - Inquiry >> Sep 22, 2019  8:42 AM Mathis Bud wrote: Reason for CRM: Maudie Mercury from [facility] called stating she sent over fax regarding new feeding tube for patient. Checking status.  Call back [phone] >> Sep 26, 2019  9:57 AM Jodie Echevaria wrote: Maudie Mercury from [facility] called stating she sent over fax on 11/17 and 11/20  regarding new feeding tube for patient. Checking status and asking for a call back please. Can be reached at Ph#  [phone]

## 2019-09-26 NOTE — Telephone Encounter (Signed)
Form completed and faxed back with confirmation.

## 2019-10-10 ENCOUNTER — Encounter: Payer: Self-pay | Admitting: Family Medicine

## 2019-10-12 ENCOUNTER — Ambulatory Visit: Payer: Self-pay

## 2019-10-12 NOTE — Telephone Encounter (Signed)
Pt.'s Mom reports pt. Had a direct COVID 19 exposure this past Tuesday. The caregiver worked with pt. For 6 hours and developed symptoms at that time. Caregiver did wear a mask. The pt. Does not currently have symptoms. Mom request a visit to discuss testing, how long to wait to test, etc. Warm transfer to [facility] in the practice for a virtual visit.  Answer Assessment - Initial Assessment Questions 1. COVID-19 CLOSE CONTACT: "Who is the person with the confirmed or suspected COVID-19 infection that you were exposed to?"     Caregiver 2. PLACE of CONTACT: "Where were you when you were exposed to COVID-19?" (e.g., home, school, medical waiting room; which city?)     Home 3. TYPE of CONTACT: "How much contact was there?" (e.g., sitting next to, live in same house, work in same office, same building)     In the home - 6 hours 4. DURATION of CONTACT: "How long were you in contact with the COVID-19 patient?" (e.g., a few seconds, passed by person, a few minutes, 15 minutes or longer, live with the patient)     6 hours 5. MASK: "Were you wearing a mask?" "Was the other person wearing a mask?" Note: wearing a mask reduces the risk of an  otherwise close contact.     Yes 6. DATE of CONTACT: "When did you have contact with a COVID-19 patient?" (e.g., how many days ago)     Tuesday 7. COMMUNITY SPREAD: "Are there lots of cases of COVID-19 (community spread) where you live?" (See public health department website, if unsure)       Yes 8. SYMPTOMS: "Do you have any symptoms?" (e.g., fever, cough, breathing difficulty, loss of taste or smell)     No 9. PREGNANCY OR POSTPARTUM: "Is there any chance you are pregnant?" "When was your last menstrual period?" "Did you deliver in the last 2 weeks?"     n/a 10. HIGH RISK: "Do you have any heart or lung problems? Do you have a weak immune system?" (e.g., heart failure, COPD, asthma, HIV positive, chemotherapy, renal failure, diabetes mellitus, sickle cell anemia,  obesity)       Yes 11.  TRAVEL: "Have you traveled out of the country recently?" If so, "When and where?"  Also ask about out-of-state travel, since the CDC has identified some high-risk cities for community spread in the [place].  Note: Travel becomes less relevant if there is widespread community transmission where the patient lives.       No  Protocols used: CORONAVIRUS (COVID-19) EXPOSURE-A-AH

## 2019-10-12 NOTE — Telephone Encounter (Signed)
Noted  

## 2019-10-13 ENCOUNTER — Encounter: Payer: Self-pay | Admitting: *Deleted

## 2019-10-13 ENCOUNTER — Other Ambulatory Visit: Payer: Self-pay

## 2019-10-13 ENCOUNTER — Ambulatory Visit (INDEPENDENT_AMBULATORY_CARE_PROVIDER_SITE_OTHER): Payer: Medicare Other | Admitting: Family Medicine

## 2019-10-13 DIAGNOSIS — Z20828 Contact with and (suspected) exposure to other viral communicable diseases: Secondary | ICD-10-CM | POA: Diagnosis not present

## 2019-10-13 DIAGNOSIS — Z20822 Contact with and (suspected) exposure to covid-19: Secondary | ICD-10-CM

## 2019-10-16 DIAGNOSIS — Z20822 Contact with and (suspected) exposure to covid-19: Secondary | ICD-10-CM | POA: Insufficient documentation

## 2019-10-16 DIAGNOSIS — Z20828 Contact with and (suspected) exposure to other viral communicable diseases: Secondary | ICD-10-CM | POA: Insufficient documentation

## 2019-10-16 NOTE — Assessment & Plan Note (Signed)
Patient requires total care and his personal care aide spent 6 hours with him and then was diagnosed with COVID later that day. Had just begun to cough. His mother gives the history and she reports he is not having any concerning symptoms. Will proceed with testing due to patients high risk status. They will report any concerning symptoms

## 2019-10-16 NOTE — Progress Notes (Signed)
Virtual Visit via Video Note  I connected with ROHAAN BONAWITZ on 10/13/19 at  9:20 AM EST by a video enabled telemedicine application and verified that I am speaking with the correct person using two identifiers.  Location: Patient: home Provider: home   I discussed the limitations of evaluation and management by telemedicine and the availability of in person appointments. The patient expressed understanding and agreed to proceed. CMA was able to get the patient and mother set up on a video visit   Subjective:    Patient ID: [name], male    DOB: 02-Oct-1986, 33 y.o.   MRN: [id number]  Chief Complaint  Patient presents with  . covid exposure    no symptoms, person aid had it and spent 6 hrs with patient    HPI Patient is in today for evaluation after being exposed to [place]. His mother gives the history for patient who is total care and gives quadriplegia and cerebral palsy. Recently his full time aide spent 6 hours caring for him and then was diagnosed with COVID. The aides was coughing some while he cared for the patient. The patient is not exhibiting any concerning new symptoms.  No fevers, cough, new GI concerns etc.   Past Medical History:  Diagnosis Date  . Cerebral palsy ([place])   . Dehydration 11/22/2013  . Depression with anxiety 08/01/2010   Qualifier: Diagnosis of  By: Nelson-Smith CMA (AAMA), Dottie    . Dyslipidemia 08/19/2017  . Esophagitis 2011  . Gastrostomy in place [facility]) 08/31/2013  . GERD (gastroesophageal reflux disease)   . Hyperlipidemia, mild 08/25/2015  . Hyperthyroidism   . Incontinence of feces   . Loss of weight 08/28/2014  . Medicare annual wellness visit, subsequent 08/25/2015  . Mildly underweight adult 03/16/2017  . Palpitations   . Skin lesion of right ear 03/16/2017  . Thyroid disease 08/01/2010   Qualifier: Diagnosis of  By: Harlon Ditty CMA (AAMA), Dottie      Past Surgical History:  Procedure Laterality Date  . baclofen trial    .  baslofen pump implant    . ears tubes    . EYE SURGERY    . FLEXIBLE SIGMOIDOSCOPY N/A 09/07/2014   Procedure: FLEXIBLE SIGMOIDOSCOPY;  Surgeon: [name], MD;  [facility] ENDOSCOPY;  Service: Endoscopy;  Laterality: N/A;  . g-tube insert  August 2006  . hamstring released     to treat contractures.   Marland Kitchen HIP SURGERY     x2 , side   . IR CM INJ ANY COLONIC TUBE W/FLUORO  05/28/2017  . IR CM INJ ANY COLONIC TUBE W/FLUORO  07/07/2019  . IR CM INJ ANY COLONIC TUBE W/FLUORO  09/18/2019  . IR GASTR TUBE CONVERT GASTR-JEJ PER W/FL MOD SED  03/14/2019  . IR GENERIC HISTORICAL  07/01/2016   IR GASTR TUBE CONVERT GASTR-JEJ PER W/FL MOD SED 07/01/2016 [name], MD [facility]-INTERV RAD  . IR GENERIC HISTORICAL  07/08/2016   IR PATIENT EVAL TECH 0-60 MINS 07/08/2016 [name], MD [facility]  . IR GENERIC HISTORICAL  07/14/2016   IR GJ TUBE CHANGE 07/14/2016 [name], MD [facility]  . IR GENERIC HISTORICAL  07/21/2016   IR PATIENT EVAL TECH 0-60 MINS [facility]  . IR GENERIC HISTORICAL  08/31/2016   IR [place] DUODEN/JEJUNO TUBE PERCUT W/FLUORO 08/31/2016 [name], MD [facility]  . IR GENERIC HISTORICAL  09/03/2016   IR GJ TUBE CHANGE 09/03/2016 [name], MD [facility]  . IR  GENERIC HISTORICAL  09/10/2016   IR GASTR TUBE CONVERT GASTR-JEJ PER W/FL MOD SED 09/10/2016 [facility]  . IR GENERIC HISTORICAL  09/16/2016   IR PATIENT EVAL TECH 0-60 MINS [facility]  . IR GENERIC HISTORICAL  09/29/2016   IR GJ TUBE CHANGE 09/29/2016 [name], MD [facility]  . IR GJ TUBE CHANGE  02/05/2017  . IR GJ TUBE CHANGE  05/21/2017  . IR GJ TUBE CHANGE  08/25/2017  . IR GJ TUBE CHANGE  01/18/2018  . IR GJ TUBE CHANGE  02/04/2018  . IR GJ TUBE CHANGE  04/21/2018  . IR GJ TUBE CHANGE  08/18/2018  . IR GJ TUBE CHANGE  09/12/2019  . IR MECH REMOV OBSTRUC MAT ANY COLON TUBE W/FLUORO  09/02/2018  . IR REPLC GASTRO/COLONIC TUBE PERCUT W/FLUORO  10/17/2018  . PEG PLACEMENT  10/21/2011   Procedure:  PERCUTANEOUS ENDOSCOPIC GASTROSTOMY (PEG) REPLACEMENT;  Surgeon: [name], MD;  Location: [facility] ENDOS[facility];  Service: Endoscopy;  Laterality: N/A;  . PEG PLACEMENT N/A 06/13/2013   Procedure: PERCUTANEOUS ENDOSCOPIC GASTROSTOMY (PEG) REPLACEMENT;  Surgeon: [name], MD;  Location: [facility] ENDOS[facility];  Service: Endoscopy;  Laterality: N/A;  . SPINAL FUSION    . spinal fusion to correct 70 degree kyphosis  11-2010  . spinal fusioncorrect 106 degree kyphosis    . SPINE SURGERY  ,11/20/2010, 2011   for correction of severe contracturing spinal kyphosis.   . TONSILLECTOMY      Family History  Problem Relation Age of Onset  . Asthma Mother   . Hyperlipidemia Mother   . COPD Mother   . Other Mother        bronchial stasis/ABPA  . Cancer Maternal Grandmother 55       breast  . Hyperlipidemia Maternal Grandmother   . Hypertension Maternal Grandmother   . Cancer Maternal Grandfather        prostate  . Heart disease Paternal Grandfather        CHF  . Osteoporosis Paternal Grandmother   . Arthritis Paternal Grandmother        rheumatoid    Social History   Socioeconomic History  . Marital status: Single    Spouse name: Not on file  . Number of children: 0  . Years of education: Not on file  . Highest education level: Not on file  Occupational History  . Occupation: disbaled  Tobacco Use  . Smoking status: Never Smoker  . Smokeless tobacco: Never Used  Substance and Sexual Activity  . Alcohol use: No  . Drug use: No  . Sexual activity: Never  Other Topics Concern  . Not on file  Social History Narrative  . Not on file   Social Determinants of Health   Financial Resource Strain:   . Difficulty of Paying Living Expenses: Not on file  Food Insecurity:   . Worried About Charity fundraiser in the Last Year: Not on file  . Ran Out of Food in the Last Year: Not on file  Transportation Needs:   . Lack of Transportation (Medical): Not on file  . Lack of Transportation  (Non-Medical): Not on file  Physical Activity:   . Days of Exercise per Week: Not on file  . Minutes of Exercise per Session: Not on file  Stress:   . Feeling of Stress : Not on file  Social Connections:   . Frequency of Communication with Friends and Family: Not on file  . Frequency of Social Gatherings with  Friends and Family: Not on file  . Attends Religious Services: Not on file  . Active Member of Clubs or Organizations: Not on file  . Attends Archivist Meetings: Not on file  . Marital Status: Not on file  Intimate Partner Violence:   . Fear of Current or Ex-Partner: Not on file  . Emotionally Abused: Not on file  . Physically Abused: Not on file  . Sexually Abused: Not on file    Outpatient Medications Prior to Visit  Medication Sig Dispense Refill  . AMBULATORY NON FORMULARY MEDICATION Medication Name: MIC gastrostomy/bolus feeding tube 24 French Part number 0110-24. #2 and 10 cc lurer lock syringe #2 Dx: 4 Device 2  . bacitracin 500 UNIT/GM ointment Apply 1 application topically 2 (two) times daily. 30 g 1  . clotrimazole-betamethasone (LOTRISONE) cream Apply 1 application topically 2 (two) times daily. 45 g 1  . dantrolene (DANTRIUM) 50 MG capsule Take 1 capsule (50 mg total) by mouth 3 (three) times daily. 90 capsule 6  . divalproex (DEPAKOTE SPRINKLE) 125 MG capsule Take 2 capsules in morning, 5 capsules at bedtime (Patient taking differently: 250-625 mg. Take 2 capsules in morning, 5 capsules at bedtime) 210 capsule 4  . Incontinence Supply Disposable (PREVAIL BREEZERS MEDIUM) MISC pkg of 16- size medium 32" to 44"  Breathable cloth-like outer fabric (can't use the plastic outer surgace  Item # [id number] 16 each 6  . LORazepam (ATIVAN) 1 MG tablet Take one tablet in evenings as needed for insomnia. (Patient taking differently: Take one tablet in evenings for insomnia.) 30 tablet 1  . Misc. Devices (ALL-BODY MASSAGE) MISC 1 Units/hr by Does not apply route as  needed. Full body massage for Muscle spasticity due to Cerebral palsy 99 each 99  . mupirocin ointment (BACTROBAN) 2 % Apply to area thin film twice daily if needed 22 g 0  . NON FORMULARY Bard Leg Bag Extension tubing w/Connector 18", Sterile, latex-free  Item# [id number]    . NON FORMULARY Colorplast Freedom Cath Latex Self-Adhering Male External Catheter 56mm Diameter Intermediate  Item# [id number]    . NONFORMULARY OR COMPOUNDED ITEM Covidien REF [id number] - [place] Joey Pump Set with Flush Bags - 1000 mL 1 each 0  . Nutritional Supplements (FEEDING SUPPLEMENT, JEVITY 1.5 CAL,) LIQD Run tube feed at 80 mL per hour over 15 hours. (3 cartons) 80 mL 11  . Nutritional Supplements (FEEDING SUPPLEMENT, VITAL 1.5 CAL,) LIQD Place 80 mLs into feeding tube continuous. 2 cartons 80 mL 11  . nystatin (MYCOSTATIN) 100000 UNIT/ML suspension 5 ml per tube qid 60 mL 0  . OLANZapine (ZYPREXA) 2.5 MG tablet Place 5 mg into feeding tube 2 (two) times daily.   3  . Omeprazole-Sodium Bicarbonate (ZEGERID) 20-1100 MG CAPS capsule Take 1 capsule by mouth daily before breakfast.    . Ostomy Supplies (PROTECTIVE BARRIER WIPES) MISC 1-1/4" X 3"  Item [id number] 75 each 6  . PARoxetine (PAXIL) 10 MG tablet Take 15 mg by mouth daily.    . sucralfate (CARAFATE) 1 g tablet TAKE 1 TABLET BY MOUTH 4 TIMES DAILY - WITH MEALS AND AT BEDTIME. 120 tablet 1   No facility-administered medications prior to visit.    Allergies  Allergen Reactions  . Ambien [Zolpidem Tartrate] Nausea Only  . Antihistamines, Chlorpheniramine-Type     Other reaction(s): Other (See Comments) Other Reaction: agitation  . Codeine Other (See Comments)    Makes patient too active after a few days.  . Metoclopramide Other (  See Comments)    Delusion, emotionality   . Baclofen Anxiety  . Pheniramine Rash    Other reaction(s): Other (See Comments) Other Reaction: agitation  . Sulfa Antibiotics Rash  . Sulfonamide Derivatives Rash    Review of  Systems  Constitutional: Negative for fever and malaise/fatigue.  HENT: Negative for congestion.   Respiratory: Negative for cough and shortness of breath.   Cardiovascular: Negative for chest pain, palpitations and leg swelling.  Gastrointestinal: Negative for abdominal pain, blood in stool and nausea.  Genitourinary: Negative for dysuria and frequency.  Musculoskeletal: Negative for falls.  Skin: Negative for rash.  Neurological: Negative for loss of consciousness and headaches.  Endo/Heme/Allergies: Negative for environmental allergies.  Psychiatric/Behavioral: The patient is not nervous/anxious.        Objective:    Physical Exam unable to obtain via phone  There were no vitals taken for this visit. Wt Readings from Last 3 Encounters:  08/30/19 105 lb (47.6 kg)  04/12/19 105 lb (47.6 kg)  01/17/19 103 lb (46.7 kg)    Diabetic Foot Exam - Simple   No data filed     Lab Results  Component Value Date   WBC 3.7 (L) 12/01/2018   HGB 15.1 12/01/2018   HCT 43.6 12/01/2018   PLT 132.0 (L) 12/01/2018   GLUCOSE 67 (L) 08/16/2018   CHOL 119 08/16/2018   TRIG 78.0 08/16/2018   HDL 32.70 (L) 08/16/2018   LDLCALC 70 08/16/2018   ALT 31 08/16/2018   AST 21 08/16/2018   NA 138 08/16/2018   K 4.1 08/16/2018   CL 101 08/16/2018   CREATININE 0.34 (L) 08/16/2018   BUN 8 08/16/2018   CO2 28 08/16/2018   TSH 2.22 08/16/2018   HGBA1C 5.0 04/29/2007    Lab Results  Component Value Date   TSH 2.22 08/16/2018   Lab Results  Component Value Date   WBC 3.7 (L) 12/01/2018   HGB 15.1 12/01/2018   HCT 43.6 12/01/2018   MCV 91.2 12/01/2018   PLT 132.0 (L) 12/01/2018   Lab Results  Component Value Date   NA 138 08/16/2018   K 4.1 08/16/2018   CO2 28 08/16/2018   GLUCOSE 67 (L) 08/16/2018   BUN 8 08/16/2018   CREATININE 0.34 (L) 08/16/2018   BILITOT 0.4 08/16/2018   ALKPHOS 90 08/16/2018   AST 21 08/16/2018   ALT 31 08/16/2018   PROT 7.4 08/16/2018   ALBUMIN 4.3  08/16/2018   CALCIUM 9.3 08/16/2018   ANIONGAP 11 09/18/2014   GFR 314.92 08/16/2018   Lab Results  Component Value Date   CHOL 119 08/16/2018   Lab Results  Component Value Date   HDL 32.70 (L) 08/16/2018   Lab Results  Component Value Date   LDLCALC 70 08/16/2018   Lab Results  Component Value Date   TRIG 78.0 08/16/2018   Lab Results  Component Value Date   CHOLHDL 4 08/16/2018   Lab Results  Component Value Date   HGBA1C 5.0 04/29/2007       Assessment & Plan:   Problem List Items Addressed This Visit    Close exposure to COVID-19 virus    Patient requires total care and his personal care aide spent 6 hours with him and then was diagnosed with COVID later that day. Had just begun to cough. His mother gives the history and she reports he is not having any concerning symptoms. Will proceed with testing due to patients high risk status. They will report any  concerning symptoms         I am having Trevione S. Candela "[name]" maintain his divalproex, clotrimazole-betamethasone, Omeprazole-Sodium Bicarbonate, NON FORMULARY, NON FORMULARY, Protective Barrier Wipes, Prevail Breezers Medium, bacitracin, NONFORMULARY OR COMPOUNDED ITEM, LORazepam, PARoxetine, AMBULATORY NON FORMULARY MEDICATION, OLANZapine, mupirocin ointment, feeding supplement (JEVITY 1.5 CAL), feeding supplement (VITAL 1.5 CAL), nystatin, All-Body Massage, dantrolene, and sucralfate.  No orders of the defined types were placed in this encounter.    I discussed the assessment and treatment plan with the patient. The patient was provided an opportunity to ask questions and all were answered. The patient agreed with the plan and demonstrated an understanding of the instructions.   The patient was advised to call back or seek an in-person evaluation if the symptoms worsen or if the condition fails to improve as anticipated.  I provided 10 minutes of non-face-to-face time during this encounter.   [name],  MD

## 2019-10-17 ENCOUNTER — Ambulatory Visit: Payer: Self-pay

## 2019-10-17 NOTE — Telephone Encounter (Signed)
understood

## 2019-10-17 NOTE — Telephone Encounter (Signed)
Mom called and states she "is uneasy about taking him to be tested. I don't want to expose him to anything. We are not going to get tested tomorrow." Reports pt. Does not have any symptoms. Would like Dr. Charlett Blake to know.

## 2019-10-18 ENCOUNTER — Other Ambulatory Visit: Payer: Medicare Other

## 2019-10-18 IMAGING — XA CONVERT G-TUBE TO G-JTUBE
2 series · 5 of 5 positions shown · non-contrast
Comparison: Multiple previous fluoroscopic guided gastrojejunostomy
catheter exchanges, most recently on 10/17/2018

INDICATION: Inadvertent removal of chronic feeding gastrojejunostomy catheter.
Please perform fluoroscopic guided exchange.

EXAM:
FLUOROSCOPIC GUIDED REPLACEMENT OF GASTROJEJUNOSTOMY TUBE

[Series 1: fl - angio · 4 of 51 frames shown]
[frame 8/51]
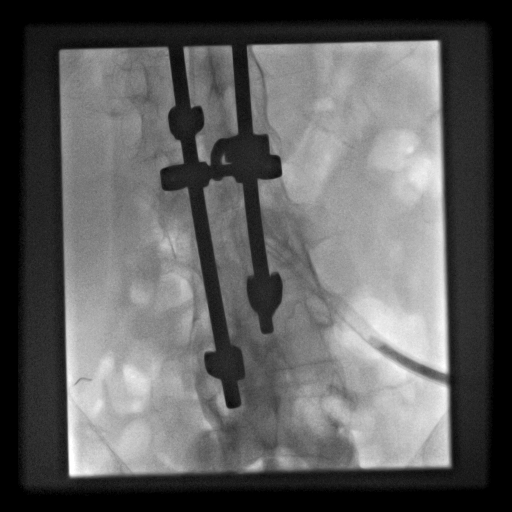
[frame 9/51]
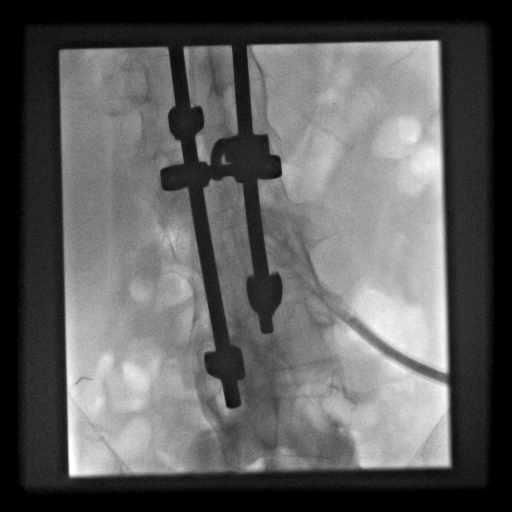
[frame 26/51]
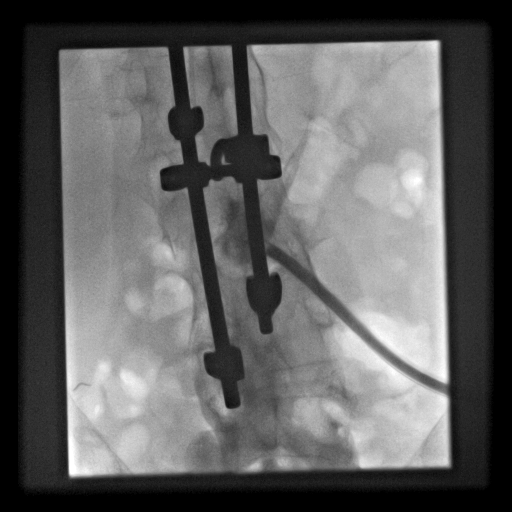
[frame 44/51]
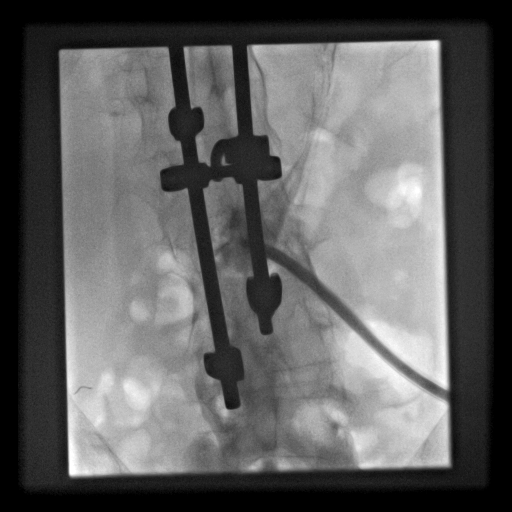

[Series 300: tube placements · 1 of 1 slices shown]
[im 1/1]
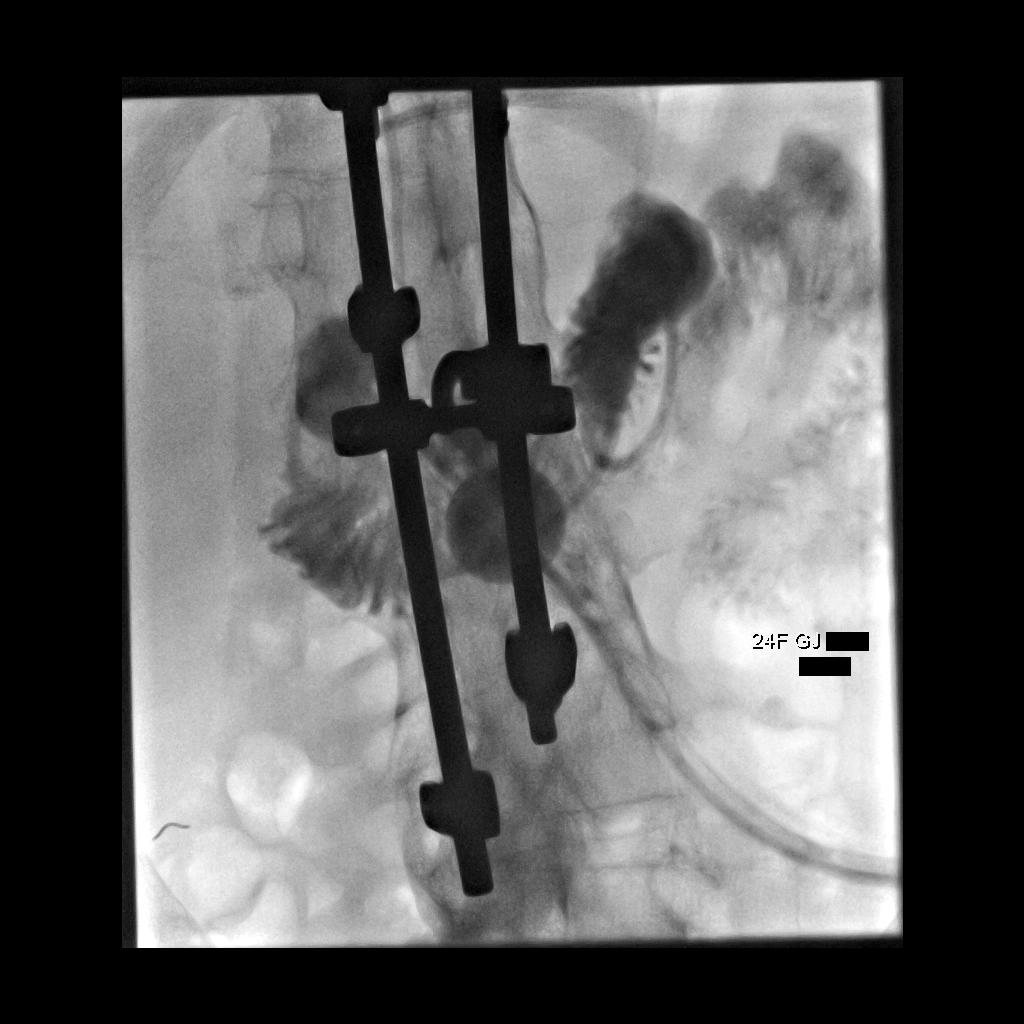

[5 of 5 positions shown; findings below may reference images not displayed]

MEDICATIONS:
None.

CONTRAST:  18 mL OMNIPAQUE IOHEXOL 300 MG/ML SOLN administered into
the stomach and proximal small bowel

FLUOROSCOPY TIME:  2 minutes (16 seconds)

COMPLICATIONS:
None.

PROCEDURE:
Informed written consent was obtained from the patient's mother
after a discussion of the risks and benefits. The upper abdomen and
the external portion of the existing gastrostomy was prepped and
draped in the usual sterile fashion, and a sterile drape was applied
covering the operative field. Maximum barrier sterile technique with
sterile gowns and gloves were used for the procedure. A timeout was
performed prior to the initiation of the procedure.

Limited contrast injection was performed via the existing
gastrostomy tube placed by the patient's mother for track
preservation purposes, demonstrating appropriate positioning within
the stomach lumen.

The external portion of the existing gastrostomy tube was cut and a
gastrostomy tube was removed intact.

Next, with the use of a stiff glidewire, a Kumpe catheter was
advanced to the level of the proximal small bowel. Contrast
injection confirmed appropriate positioning.

Next, a new 24 French gastrojejunostomy catheter was advanced over
the guidewire with tip terminating within the proximal jejunum.
Note, the distal approximately 6 inches of the gastrojejunostomy
catheter was trimmed.

The retention balloon was inflated with saline and dilute contrast
and the external disc was cinched.

Contrast injection via the jejunostomy and gastric lumens confirmed
appropriate functioning and positioning.

A dressing was placed. The patient tolerated procedure well without
immediate postprocedural complication.
IMPRESSION: Successful fluoroscopic guided replacement of new 24 French feeding
gastrojejunostomy catheter. The tip of the jejunostomy lumen lies
within the proximal jejunum. Both lumens ready for immediate use.

## 2019-12-07 ENCOUNTER — Telehealth: Payer: Self-pay

## 2019-12-07 NOTE — Telephone Encounter (Signed)
Patient mother Mrs. Walenta called in to see if Dr. Charlett Blake can send in a perscription for    clotrimazole-betamethasone Donalynn Furlong) cream [id number]  To: [facility], [address]  [place], [place] [address]  Phone:  [phone] Fax:  [phone]  DEA #:  --   Thanks,

## 2019-12-08 ENCOUNTER — Other Ambulatory Visit: Payer: Self-pay | Admitting: Family Medicine

## 2019-12-08 DIAGNOSIS — L309 Dermatitis, unspecified: Secondary | ICD-10-CM

## 2019-12-08 MED ORDER — CLOTRIMAZOLE-BETAMETHASONE 1-0.05 % EX CREA: 1.0000 "application " | TOPICAL_CREAM | Freq: Two times a day (BID) | CUTANEOUS | 1 refills | Status: DC

## 2019-12-08 NOTE — Telephone Encounter (Signed)
Please advise 

## 2019-12-08 NOTE — Telephone Encounter (Signed)
I  sent to [facility] drug

## 2019-12-13 ENCOUNTER — Encounter: Payer: Medicare Other | Admitting: Physical Medicine & Rehabilitation

## 2019-12-14 DIAGNOSIS — F339 Major depressive disorder, recurrent, unspecified: Secondary | ICD-10-CM | POA: Diagnosis not present

## 2019-12-28 ENCOUNTER — Ambulatory Visit: Payer: Medicare Other | Attending: Family

## 2019-12-28 ENCOUNTER — Other Ambulatory Visit (HOSPITAL_COMMUNITY): Payer: Self-pay | Admitting: Interventional Radiology

## 2019-12-28 ENCOUNTER — Other Ambulatory Visit (HOSPITAL_COMMUNITY): Payer: Medicare Other

## 2019-12-28 DIAGNOSIS — Z23 Encounter for immunization: Secondary | ICD-10-CM

## 2019-12-28 DIAGNOSIS — R633 Feeding difficulties, unspecified: Secondary | ICD-10-CM

## 2019-12-28 NOTE — Progress Notes (Signed)
   Covid-19 Vaccination Clinic  Name:  [name]    MRN: [id number] DOB: 10-20-1986  12/28/2019  [name] was observed post Covid-19 immunization for 15 minutes without incidence. He was provided with Vaccine Information Sheet and instruction to access the V-Safe system.   [name] was instructed to call 911 with any severe reactions post vaccine: Marland Kitchen Difficulty breathing  . Swelling of your face and throat  . A fast heartbeat  . A bad rash all over your body  . Dizziness and weakness    Immunizations Administered    Name Date Dose VIS Date Route   Moderna COVID-19 Vaccine 12/28/2019  4:15 PM 0.5 mL 10/03/2019 Intramuscular   Manufacturer: Moderna   [id number]   [id number]

## 2020-01-02 ENCOUNTER — Other Ambulatory Visit (HOSPITAL_COMMUNITY): Payer: Medicare Other

## 2020-01-03 ENCOUNTER — Other Ambulatory Visit: Payer: Self-pay

## 2020-01-03 ENCOUNTER — Ambulatory Visit (HOSPITAL_COMMUNITY)
Admission: RE | Admit: 2020-01-03 | Discharge: 2020-01-03 | Disposition: A | Discharge status: Home / Self Care | Payer: Medicare Other | Source: Ambulatory Visit | Attending: Interventional Radiology | Admitting: Interventional Radiology

## 2020-01-03 DIAGNOSIS — R633 Feeding difficulties, unspecified: Secondary | ICD-10-CM

## 2020-01-03 DIAGNOSIS — K9413 Enterostomy malfunction: Secondary | ICD-10-CM | POA: Diagnosis not present

## 2020-01-03 DIAGNOSIS — Z434 Encounter for attention to other artificial openings of digestive tract: Secondary | ICD-10-CM | POA: Insufficient documentation

## 2020-01-03 DIAGNOSIS — K9423 Gastrostomy malfunction: Secondary | ICD-10-CM | POA: Diagnosis not present

## 2020-01-03 HISTORY — PX: IR GJ TUBE CHANGE: IMG1440

## 2020-01-03 MED ORDER — IOHEXOL 300 MG/ML  SOLN
50.0000 mL | Freq: Once | INTRAMUSCULAR | Status: AC | PRN
  Administered 2020-01-03: 16:00:00 20 mL

## 2020-01-03 NOTE — Procedures (Signed)
Interventional Radiology Procedure Note  Procedure: Replacement of perc gastro-jejunostomy, with new 69F GJ, modified at the end.   .  Complications: None  Recommendations:  - Ok to use   - Routine  care   Signed,  Dulcy Fanny. Earleen Newport, DO

## 2020-01-17 ENCOUNTER — Ambulatory Visit: Payer: Medicare Other | Admitting: Physical Medicine & Rehabilitation

## 2020-01-17 ENCOUNTER — Encounter: Payer: Self-pay | Admitting: Internal Medicine

## 2020-01-30 ENCOUNTER — Ambulatory Visit: Payer: Medicare Other | Attending: Family

## 2020-01-30 DIAGNOSIS — Z23 Encounter for immunization: Secondary | ICD-10-CM

## 2020-01-30 NOTE — Progress Notes (Signed)
   Covid-19 Vaccination Clinic  Name:  ARTA BAZ    MRN: [id number] DOB: 10/23/1986  01/30/2020  Mr. Lorance was observed post Covid-19 immunization for 15 minutes without incident. He was provided with Vaccine Information Sheet and instruction to access the V-Safe system.   Mr. Korinek was instructed to call 911 with any severe reactions post vaccine: Marland Kitchen Difficulty breathing  . Swelling of face and throat  . A fast heartbeat  . A bad rash all over body  . Dizziness and weakness   Immunizations Administered    Name Date Dose VIS Date Route   Moderna COVID-19 Vaccine 01/30/2020 12:58 PM 0.5 mL 10/03/2019 Intramuscular   Manufacturer: Moderna   Lot: [id number]   [place][id number]

## 2020-02-08 DIAGNOSIS — F339 Major depressive disorder, recurrent, unspecified: Secondary | ICD-10-CM | POA: Diagnosis not present

## 2020-02-09 ENCOUNTER — Telehealth: Payer: Self-pay

## 2020-02-09 NOTE — Telephone Encounter (Signed)
Yes please use his August lab orders and add Valproic acid for patient and set up a lab appt for upcoming week

## 2020-02-09 NOTE — Telephone Encounter (Signed)
Pt's Mother, [name], called wanting to know if you could order a valporic acid blood test from Dr. Tamera Punt, pt's neuropsychiatrist in [place].  Per [name], his office was to send over the request via facsimile yesterday.  Pt has been having some issues and had a VOV with this doctor this week and mom would like to get this lab done as soon as possible because until they can figure out what is going on, his meds will need to be upped.  She would like to bring the pt in and do them in a combined lab visit and do the future orders that were placed by you from 06/29/19.

## 2020-02-09 NOTE — Telephone Encounter (Signed)
I did received the fax from Dr. Agapito Games at [facility].    They need Depakote level. Dx [id number].  And they would like for [place] to fax results to [phone]

## 2020-02-10 IMAGING — XA IR GI TUBE CONTRAST INJECTION
9 of 10 series · 13 of 16 positions shown · non-contrast
Comparison: none

INDICATION: 33-year-old with cerebral palsy and chronic GJ feeding tube. Concern
for increased oral secretions.

[Series 2: fl-  neuro · 3 of 98 frames shown (1 of 2)]
[frame 15/98]
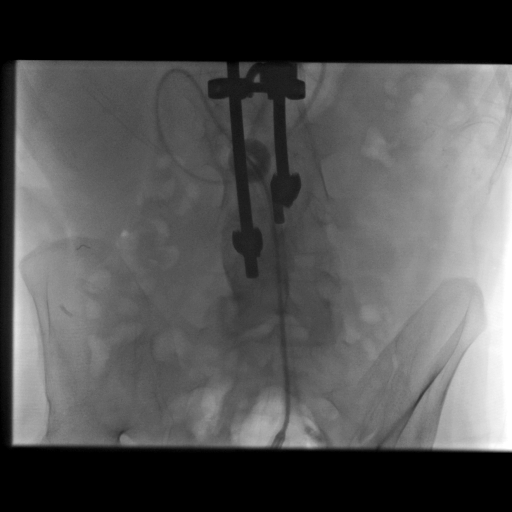
[frame 34/98]
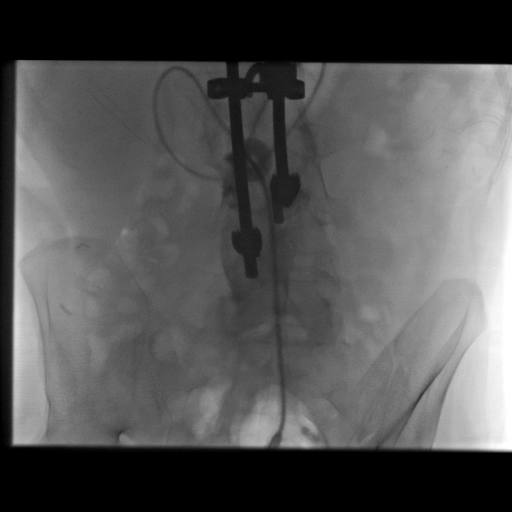
[frame 84/98]
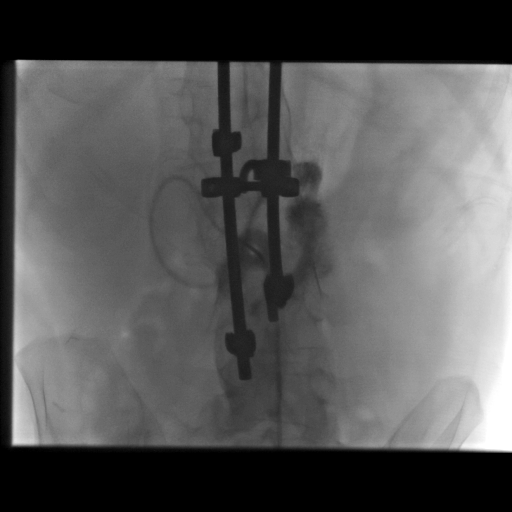

[Series 2: fl-  neuro · 3 of 98 frames shown (2 of 2)]
[frame 15/98]
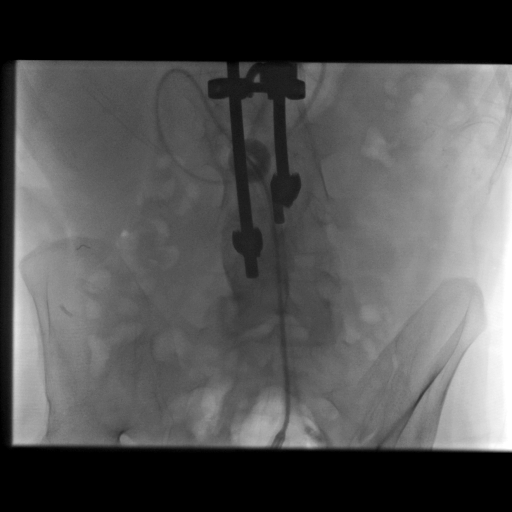
[frame 34/98]
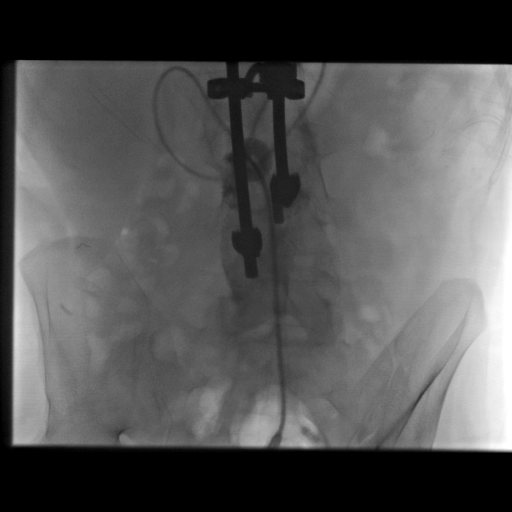
[frame 50/98]
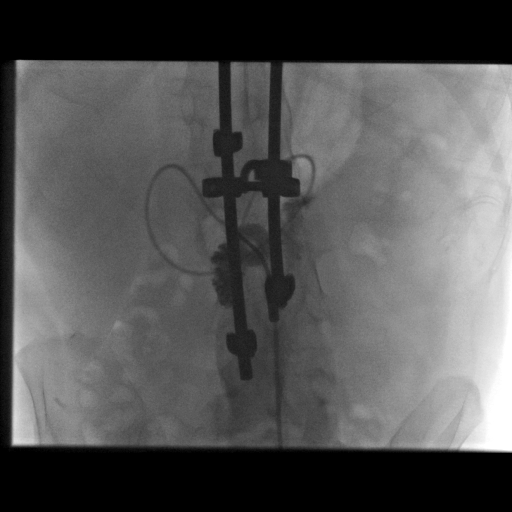

[Series 3: kp/vp · 1 of 1 slices shown (1 of 7)]
[im 1/1]
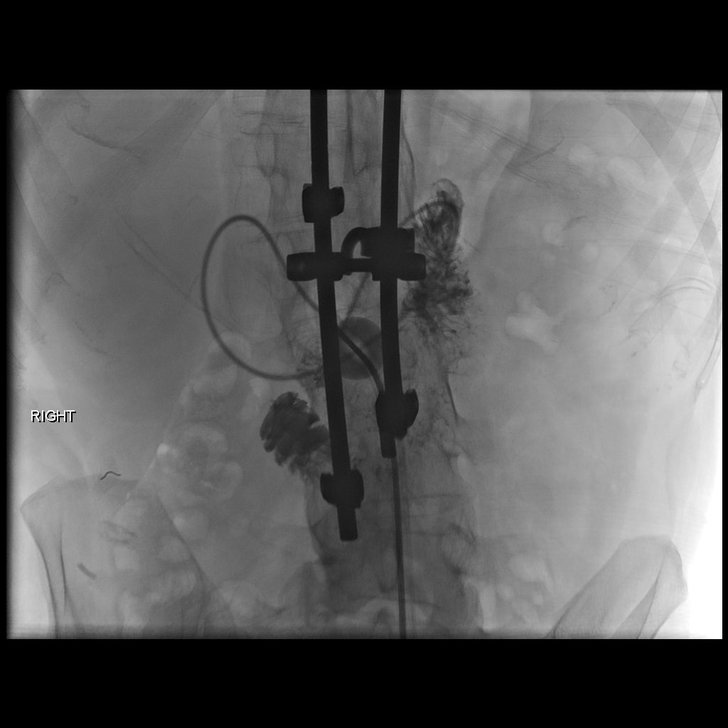

[Series 3: kp/vp · 1 of 1 slices shown (2 of 7)]
[im 1/1]
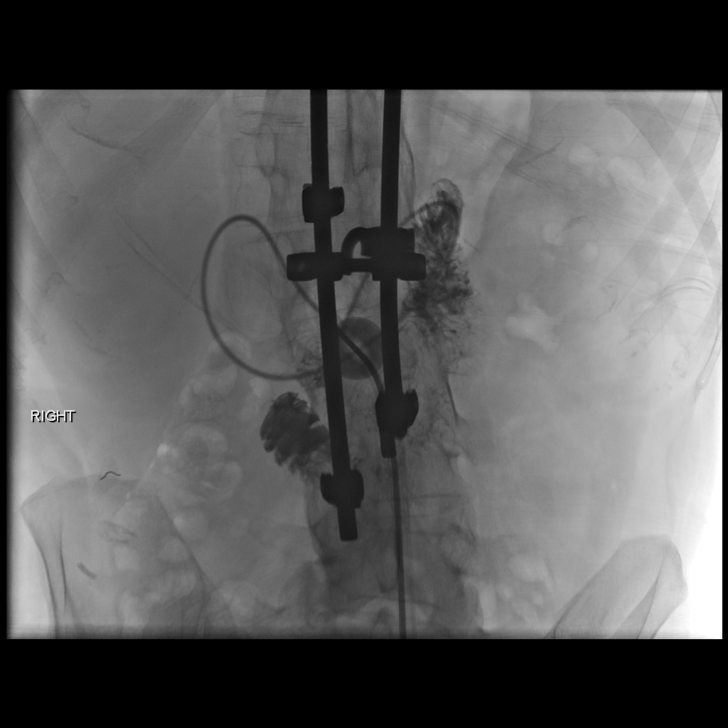

[Series 4: kp/vp · 1 of 1 slices shown (3 of 7)]
[im 1/1]
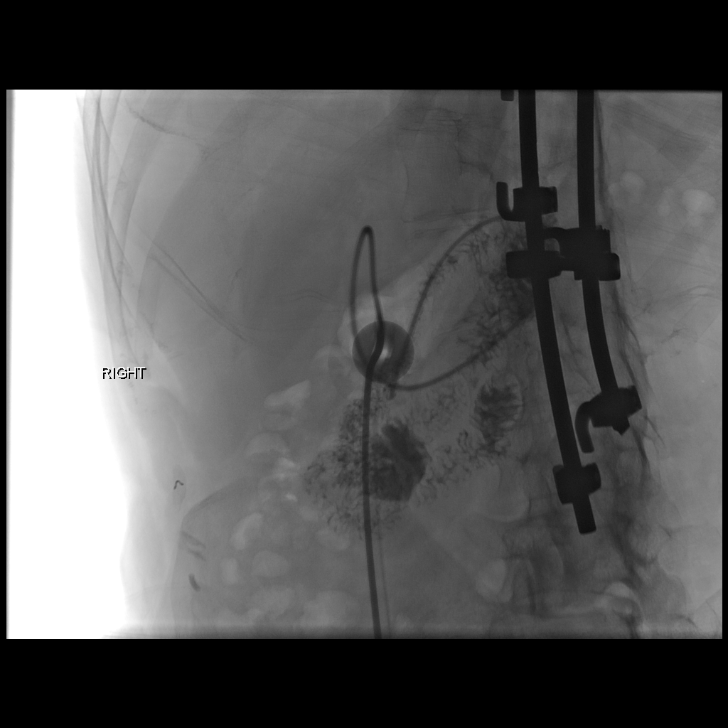

[Series 4: kp/vp · 1 of 1 slices shown (4 of 7)]
[im 1/1]
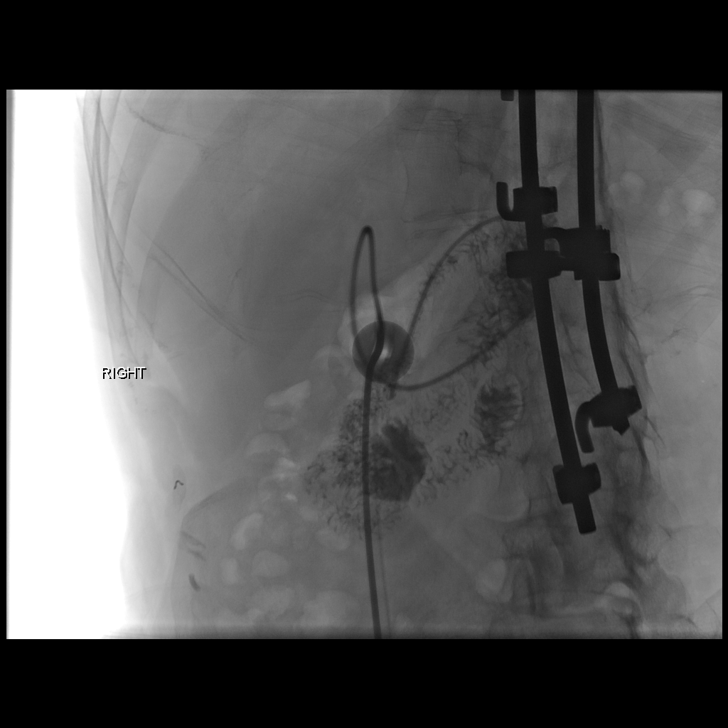

[Series 5: kp/vp · 1 of 1 slices shown (5 of 7)]
[im 1/1]
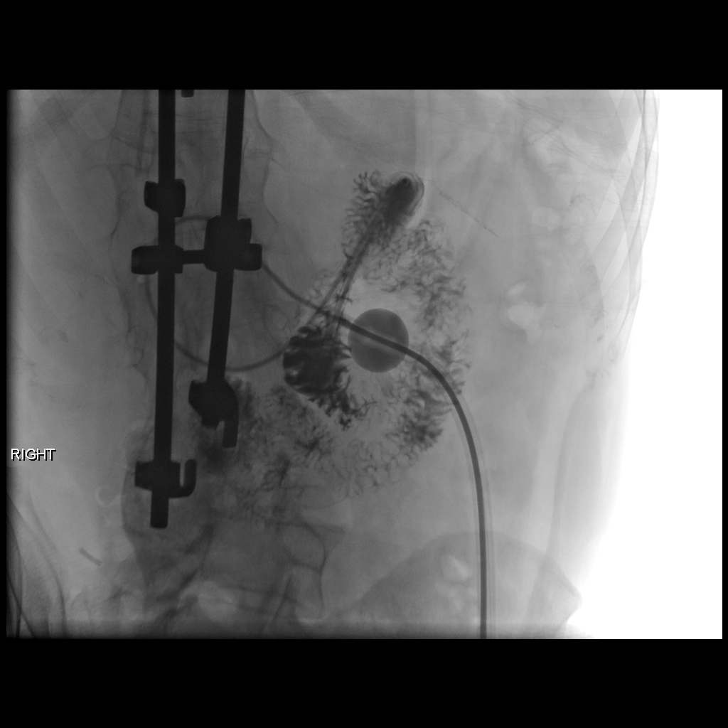

[Series 6: kp/vp · 1 of 1 slices shown (6 of 7)]
[im 1/1]
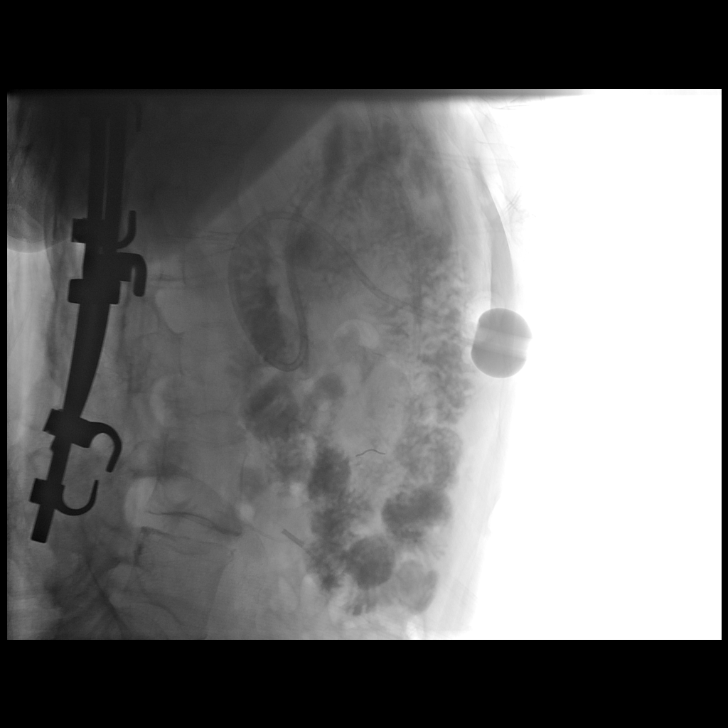

[Series 6: kp/vp · 1 of 1 slices shown (7 of 7)]
[im 1/1]
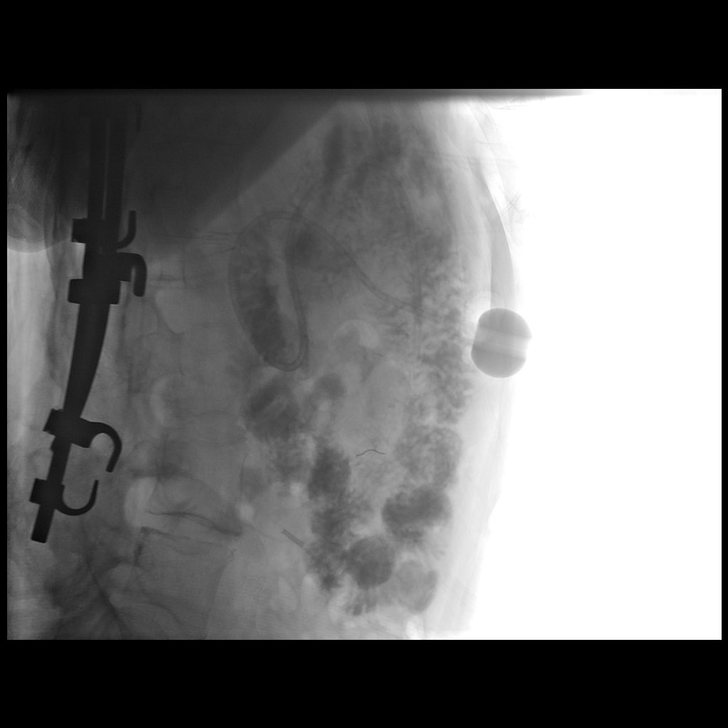

[13 of 16 positions shown; findings below may reference images not displayed]

EXAM:
INJECTION OF GJ FEEDING TUBE

MEDICATIONS:
None

ANESTHESIA/SEDATION:
None

CONTRAST:  12 ml Omnipaque 300-administered into the gastric lumen.

FLUOROSCOPY TIME:  Fluoroscopy Time: 30 seconds, 2.1 mGy

COMPLICATIONS:
None immediate.

PROCEDURE:
Patient was placed on the interventional table. Feeding tube was
injected contrast under fluoroscopic evaluation. The balloon was
moving freely within the stomach. Tube was flushed at the end of the
procedure.
FINDINGS: Retention balloon contains contrast and appears to be well
positioned in the stomach. The tip of the catheter extends into the
proximal jejunum. Contrast fills loops of small bowel. There is no
filling of the stomach.
IMPRESSION: GJ feeding tube is well positioned with the tip in the jejunum.

## 2020-02-13 ENCOUNTER — Other Ambulatory Visit (INDEPENDENT_AMBULATORY_CARE_PROVIDER_SITE_OTHER): Payer: Medicare Other

## 2020-02-13 DIAGNOSIS — E079 Disorder of thyroid, unspecified: Secondary | ICD-10-CM

## 2020-02-13 DIAGNOSIS — Z79899 Other long term (current) drug therapy: Secondary | ICD-10-CM | POA: Diagnosis not present

## 2020-02-13 DIAGNOSIS — K219 Gastro-esophageal reflux disease without esophagitis: Secondary | ICD-10-CM | POA: Diagnosis not present

## 2020-02-13 DIAGNOSIS — R946 Abnormal results of thyroid function studies: Secondary | ICD-10-CM

## 2020-02-13 DIAGNOSIS — E785 Hyperlipidemia, unspecified: Secondary | ICD-10-CM

## 2020-02-13 LAB — COMPREHENSIVE METABOLIC PANEL
ALT: 36 U/L (ref 0–53)
AST: 24 U/L (ref 0–37)
Albumin: 4.5 g/dL (ref 3.5–5.2)
Alkaline Phosphatase: 104 U/L (ref 39–117)
BUN: 9 mg/dL (ref 6–23)
CO2: 26 mEq/L (ref 19–32)
Calcium: 9.3 mg/dL (ref 8.4–10.5)
Chloride: 102 mEq/L (ref 96–112)
Creatinine, Ser: 0.33 mg/dL — ABNORMAL LOW (ref 0.40–1.50)
GFR: 304.05 mL/min (ref >60.00)
Glucose, Bld: 93 mg/dL (ref 70–99)
Potassium: 3.8 mEq/L (ref 3.5–5.1)
Sodium: 137 mEq/L (ref 135–145)
Total Bilirubin: 0.4 mg/dL (ref 0.2–1.2)
Total Protein: 7.6 g/dL (ref 6.0–8.3)

## 2020-02-13 LAB — LIPID PANEL
Cholesterol: 136 mg/dL (ref 0–200)
HDL: 31.8 mg/dL — ABNORMAL LOW (ref >39.00)
LDL Cholesterol: 89 mg/dL (ref 0–99)
NonHDL: 103.86
Total CHOL/HDL Ratio: 4
Triglycerides: 76 mg/dL (ref 0.0–149.0)
VLDL: 15.2 mg/dL (ref 0.0–40.0)

## 2020-02-13 LAB — CBC WITH DIFFERENTIAL/PLATELET
Basophils Absolute: 0 10*3/uL (ref 0.0–0.1)
Basophils Relative: 0.7 % (ref 0.0–3.0)
Eosinophils Absolute: 0.1 10*3/uL (ref 0.0–0.7)
Eosinophils Relative: 1 % (ref 0.0–5.0)
HCT: 44.5 % (ref 39.0–52.0)
Hemoglobin: 15.3 g/dL (ref 13.0–17.0)
Lymphocytes Relative: 33.9 % (ref 12.0–46.0)
Lymphs Abs: 1.9 10*3/uL (ref 0.7–4.0)
MCHC: 34.4 g/dL (ref 30.0–36.0)
MCV: 94.3 fl (ref 78.0–100.0)
Monocytes Absolute: 0.4 10*3/uL (ref 0.1–1.0)
Monocytes Relative: 7.8 % (ref 3.0–12.0)
Neutro Abs: 3.1 10*3/uL (ref 1.4–7.7)
Neutrophils Relative %: 56.6 % (ref 43.0–77.0)
Platelets: 231 10*3/uL (ref 150.0–400.0)
RBC: 4.72 Mil/uL (ref 4.22–5.81)
RDW: 12.3 % (ref 11.5–15.5)
WBC: 5.5 10*3/uL (ref 4.0–10.5)

## 2020-02-13 LAB — TSH: TSH: 1.61 u[IU]/mL (ref 0.35–4.50)

## 2020-02-13 NOTE — Addendum Note (Signed)
Addended by: Kem Boroughs D on: 02/13/2020 10:49 AM   Modules accepted: Orders

## 2020-02-13 NOTE — Telephone Encounter (Signed)
Order added and mom will have caretaker to take patient to [facility] lab.  Labs changed over.  Also appointment canceled for Friday lab.

## 2020-02-14 ENCOUNTER — Telehealth: Payer: Self-pay

## 2020-02-14 ENCOUNTER — Encounter: Payer: Self-pay | Admitting: *Deleted

## 2020-02-14 LAB — VALPROIC ACID LEVEL: Valproic Acid Lvl: 69.7 mg/L (ref 50.0–100.0)

## 2020-02-14 NOTE — Telephone Encounter (Signed)
Patients mother [name] called in to see if Dr. Charlett Blake or the nurse could give her a call to discuss her sons test results. Please call [name] at [phone]

## 2020-02-16 ENCOUNTER — Other Ambulatory Visit: Payer: Medicare Other

## 2020-02-19 NOTE — Telephone Encounter (Signed)
Called patients mom left message for patients mom to give me a call

## 2020-02-21 ENCOUNTER — Other Ambulatory Visit: Payer: Self-pay

## 2020-02-21 ENCOUNTER — Ambulatory Visit
Admission: RE | Admit: 2020-02-21 | Discharge: 2020-02-21 | Disposition: A | Discharge status: Home / Self Care | Payer: Medicare Other | Source: Ambulatory Visit | Attending: Physical Medicine & Rehabilitation | Admitting: Physical Medicine & Rehabilitation

## 2020-02-21 ENCOUNTER — Encounter: Payer: Medicare Other | Attending: Physical Medicine & Rehabilitation | Admitting: Physical Medicine & Rehabilitation

## 2020-02-21 ENCOUNTER — Encounter: Payer: Self-pay | Admitting: Physical Medicine & Rehabilitation

## 2020-02-21 VITALS — BP 112/75 | HR 58 | Temp 98.7°F

## 2020-02-21 DIAGNOSIS — R058 Other specified cough: Secondary | ICD-10-CM

## 2020-02-21 DIAGNOSIS — G809 Cerebral palsy, unspecified: Secondary | ICD-10-CM

## 2020-02-21 DIAGNOSIS — G243 Spasmodic torticollis: Secondary | ICD-10-CM | POA: Diagnosis not present

## 2020-02-21 DIAGNOSIS — R05 Cough: Secondary | ICD-10-CM

## 2020-02-21 DIAGNOSIS — G8250 Quadriplegia, unspecified: Secondary | ICD-10-CM

## 2020-02-21 MED ORDER — DANTROLENE SODIUM 50 MG PO CAPS: 50.0000 mg | ORAL_CAPSULE | Freq: Three times a day (TID) | ORAL | 6 refills | Status: DC

## 2020-02-21 NOTE — Progress Notes (Signed)
Botox Injection for spasticity using needle EMG guidance Indication: Spastic tetraplegia (HCC)  Cervical dystonia   Dilution: 100 Units/ml        Total Units Injected: 600 Indication: Severe spasticity which interferes with ADL,mobility and/or  hygiene and is unresponsive to medication management and other conservative care Informed consent was obtained after describing risks and benefits of the procedure with the patient. This includes bleeding, bruising, infection, excessive weakness, or medication side effects. A REMS form is on file and signed.  Left and right Needle: 45mm injectable monopolar needle electrode Left SCM 100u in 2 locations Left Trap, 100u in 3 locations Number of units per muscle Pectoralis Major 0 units Pectoralis Minor 0 units Biceps 100 units right, 100units left Brachioradialis 100 units right, 100 units left FCR 0 units FCU 0 units FDS 0 units FDP 0 units FPL 0 units Pronator Teres 0 units Pronator Quadratus 0 units Lumbricals 0 units Quadriceps 0 units Gastroc/soleus 0 units Hamstrings 0 units Tibialis Posterior 0 units Tibialis Anterior 0 units EHL 0 units All injections were done after obtaining appropriate EMG activity and after negative drawback for blood. The patient tolerated the procedure well. Post procedure instructions were given. Return in about 3 months (around 05/22/2020) for repeat botox 600u.    Also ordered cxr as pt with productive cough and decreased breath sounds, wheezing in right upper,lower lobes

## 2020-02-21 NOTE — Patient Instructions (Signed)
PLEASE FEEL FREE TO CALL OUR OFFICE WITH ANY PROBLEMS OR QUESTIONS ([phone])      

## 2020-03-12 ENCOUNTER — Ambulatory Visit (INDEPENDENT_AMBULATORY_CARE_PROVIDER_SITE_OTHER): Payer: Medicare Other | Admitting: Internal Medicine

## 2020-03-12 ENCOUNTER — Encounter: Payer: Self-pay | Admitting: Internal Medicine

## 2020-03-12 VITALS — BP 100/80 | HR 92 | Temp 97.5°F | Ht 60.0 in

## 2020-03-12 DIAGNOSIS — Z934 Other artificial openings of gastrointestinal tract status: Secondary | ICD-10-CM

## 2020-03-12 DIAGNOSIS — G800 Spastic quadriplegic cerebral palsy: Secondary | ICD-10-CM | POA: Diagnosis not present

## 2020-03-12 DIAGNOSIS — K5900 Constipation, unspecified: Secondary | ICD-10-CM | POA: Diagnosis not present

## 2020-03-12 NOTE — Patient Instructions (Signed)
Continue Zegerid.   Continue Miralax as needed.  Please follow up with Dr Hilarie Fredrickson in 1 year.  If you are age 36 or older, your body mass index should be between 23-30. Your Body mass index is 20.51 kg/m. If this is out of the aforementioned range listed, please consider follow up with your Primary Care Provider.  If you are age 45 or younger, your body mass index should be between 19-25. Your Body mass index is 20.51 kg/m. If this is out of the aformentioned range listed, please consider follow up with your Primary Care Provider.   Due to recent changes in healthcare laws, you may see the results of your imaging and laboratory studies on MyChart before your provider has had a chance to review them.  We understand that in some cases there may be results that are confusing or concerning to you. Not all laboratory results come back in the same time frame and the provider may be waiting for multiple results in order to interpret others.  Please give [place] 48 hours in order for your provider to thoroughly review all the results before contacting the office for clarification of your results.

## 2020-03-12 NOTE — Progress Notes (Signed)
Subjective:    Patient ID: [name], male    DOB: 02/07/1986, 34 y.o.   MRN: [id number]  HPI [name] is a 34 year old male with a history of cerebral palsy with quadriplegia/spasticity, severe scoliosis/kyphosis with prior spinal fusion, esophageal dysphagia and chronic GJ tube for medicine/feeding who is here for follow-up.  He is here today with his mother and was last seen on 04/19/2018.  Overall [name] is doing fairly well.  He was having issues with secretions and aspiration with pneumonia.  His tube feeding formula was switched on advice from his dietitian to a plant-based all organic formula called Dillard [place].  After a few weeks of this change his secretions have improved.  He still does have periods where his secretions seem to be more and he will require frequent suctioning.  Fortunately he has not had recent aspiration pneumonia.  He continues Zegerid daily but rarely uses Carafate.  His GJ tube is functioning well and was changed 4 to 6 weeks ago by IR.  His mother has used some MiraLAX on my advice for constipation with abdominal fullness and decrease in appetite.  She gave him 17 g for 2 days in a row and he had severe diarrhea and a purge type response.  No blood in stool or melena.  She was thinking of giving him a half dose of MiraLAX every other day but he has not really needed it of late his bowel movements have been more normal.  He is taking nothing by mouth.  His mother works diligently multiple times per day on oral care.  He recently got to go to [place] to see a hornets game and he will be going to [place] to see the [place] play in June.  He will turn 34 on Friday of this week.   Review of Systems As per HPI, otherwise negative  Current Medications, Allergies, Past Medical History, Past Surgical History, Family History and Social History were reviewed in [employer] record.     Objective:   Physical Exam BP 100/80   Pulse 92   Temp (!)  97.5 F (36.4 C)   Ht 5' (1.524 m)   BMI 20.51 kg/m  Gen: awake, alert, NAD, sitting in mechanical wheelchair HEENT: anicteric, op clear and dry without thrush Abd: soft, NT/ND, +BS throughout, GJ tube in place in the left mid abdomen without surrounding erythema Ext: Severe contractures bilateral upper extremities, decreased muscle bulk     Assessment & Plan:  34 year old male with a history of cerebral palsy with quadriplegia/spasticity, severe scoliosis/kyphosis with prior spinal fusion, esophageal dysphagia and chronic GJ tube for medicine/feeding who is here for follow-up  1.  Cerebral palsy with spasticity/GJ tube dependency --overall he is doing well.  We will continue him on Zegerid for acid suppression daily.  He is not really using or needing Carafate.  They will continue their current tube feeding formula.  We discussed secretions and how we could consider scopolamine patch if secretions become a problem.  However he already has a very dry mouth and this medication could worsen this symptom.  I still am open to giving scopolamine patch a try if secretions are problematic in the future.  He also continues Botox therapy. --Continue Zegerid daily --Scopolamine patch an option in the future for secretions if needed  2.  Intermittent constipation --certainly safe to use MiraLAX 17 g daily on an as-needed basis.  When he received MiraLAX for 2 days in a row  recently he had a bowel purge response with normalization of bowel movements thereafter.  He may need periodic bowel purge.  He can follow-up every 1 to 2 years, sooner if needed     30 minutes total spent today including patient facing time, coordination of care, reviewing medical history/procedures/pertinent radiology studies, and documentation of the encounter.

## 2020-03-24 ENCOUNTER — Encounter: Payer: Self-pay | Admitting: Family Medicine

## 2020-03-28 ENCOUNTER — Ambulatory Visit (HOSPITAL_BASED_OUTPATIENT_CLINIC_OR_DEPARTMENT_OTHER)
Admission: RE | Admit: 2020-03-28 | Discharge: 2020-03-28 | Disposition: A | Discharge status: Home / Self Care | Payer: Medicare Other | Source: Ambulatory Visit | Attending: Family Medicine | Admitting: Family Medicine

## 2020-03-28 ENCOUNTER — Telehealth (INDEPENDENT_AMBULATORY_CARE_PROVIDER_SITE_OTHER): Payer: Medicare Other | Admitting: Family Medicine

## 2020-03-28 ENCOUNTER — Other Ambulatory Visit: Payer: Self-pay

## 2020-03-28 VITALS — BP 96/67 | HR 103 | Temp 98.1°F | Wt 92.0 lb

## 2020-03-28 DIAGNOSIS — R079 Chest pain, unspecified: Secondary | ICD-10-CM

## 2020-03-28 DIAGNOSIS — J189 Pneumonia, unspecified organism: Secondary | ICD-10-CM | POA: Diagnosis not present

## 2020-03-28 DIAGNOSIS — J90 Pleural effusion, not elsewhere classified: Secondary | ICD-10-CM

## 2020-03-28 DIAGNOSIS — R05 Cough: Secondary | ICD-10-CM | POA: Diagnosis not present

## 2020-03-28 DIAGNOSIS — R32 Unspecified urinary incontinence: Secondary | ICD-10-CM | POA: Diagnosis not present

## 2020-03-28 DIAGNOSIS — F418 Other specified anxiety disorders: Secondary | ICD-10-CM | POA: Diagnosis not present

## 2020-03-28 DIAGNOSIS — R1312 Dysphagia, oropharyngeal phase: Secondary | ICD-10-CM | POA: Diagnosis not present

## 2020-03-29 ENCOUNTER — Telehealth: Payer: Self-pay | Admitting: *Deleted

## 2020-03-29 ENCOUNTER — Other Ambulatory Visit: Payer: Self-pay | Admitting: Family Medicine

## 2020-03-29 LAB — URINALYSIS
Bilirubin Urine: NEGATIVE
Hgb urine dipstick: NEGATIVE
Leukocytes,Ua: NEGATIVE
Nitrite: NEGATIVE
Specific Gravity, Urine: 1.015 (ref 1.000–1.030)
Total Protein, Urine: NEGATIVE
Urine Glucose: NEGATIVE
Urobilinogen, UA: 0.2 (ref 0.0–1.0)
pH: 8.5 — AB (ref 5.0–8.0)

## 2020-03-29 LAB — URINE CULTURE
MICRO NUMBER:: 10527389
Result:: NO GROWTH
SPECIMEN QUALITY:: ADEQUATE

## 2020-03-29 MED ORDER — AMOXICILLIN-POT CLAVULANATE 875-125 MG PO TABS: 1.0000 | ORAL_TABLET | Freq: Two times a day (BID) | ORAL | 0 refills | Status: DC

## 2020-03-29 NOTE — Telephone Encounter (Signed)
Left message on machine to call back to schedule appointment.    Need to schedule for follow up in person in 1 month.

## 2020-04-01 ENCOUNTER — Encounter: Payer: Self-pay | Admitting: Family Medicine

## 2020-04-01 NOTE — Assessment & Plan Note (Signed)
PEG tube in LUQ in place and functioning well

## 2020-04-01 NOTE — Progress Notes (Signed)
Virtual Visit via Video Note  I connected with [name] on 03/28/20 at 10:20 AM EDT by a video enabled telemedicine application and verified that I am speaking with the correct person using two identifiers.  Location: Patient: home, patient and his mother are in the visit Provider: office, provider in visit   I discussed the limitations of evaluation and management by telemedicine and the availability of in person appointments. The patient expressed understanding and agreed to proceed. Kem Boroughs, CMA was able to get the patient setup on a visit, video    Subjective:    Patient ID: [name], male    DOB: 1986-08-19, 34 y.o.   MRN: [id number]  No chief complaint on file.   HPI Patient is in today for evaluation of decline in patient status. His mother reports he has been declining over several months but has worsened in the past week. He has been sleeping more and coughing more. He has some gurgling and coughing. Mother has tried to switch his feeds temporarily to gatorade and agrees to discuss his change in status and weight loss. No obvious fevers or chills. Patient complains of upper abdominal/lower chest wall pain anterior location. Denies palp/SOB/HA/fevers/GI or GU c/o. Taking meds as prescribed  Past Medical History:  Diagnosis Date  . Cerebral palsy ([place])   . Dehydration 11/22/2013  . Depression with anxiety 08/01/2010   Qualifier: Diagnosis of  By: Nelson-Smith CMA (AAMA), Dottie    . Dyslipidemia 08/19/2017  . Esophagitis 2011  . Gastrostomy in place [facility]) 08/31/2013  . GERD (gastroesophageal reflux disease)   . Hyperlipidemia, mild 08/25/2015  . Hyperthyroidism   . Incontinence of feces   . Loss of weight 08/28/2014  . Medicare annual wellness visit, subsequent 08/25/2015  . Mildly underweight adult 03/16/2017  . Palpitations   . Skin lesion of right ear 03/16/2017  . Thyroid disease 08/01/2010   Qualifier: Diagnosis of  By: Harlon Ditty CMA  (AAMA), Dottie      Past Surgical History:  Procedure Laterality Date  . baclofen trial    . baslofen pump implant    . ears tubes    . EYE SURGERY    . FLEXIBLE SIGMOIDOSCOPY N/A 09/07/2014   Procedure: FLEXIBLE SIGMOIDOSCOPY;  Surgeon: [name], MD;  Location: [facility] ENDOSCOPY;  Service: Endoscopy;  Laterality: N/A;  . g-tube insert  August 2006  . hamstring released     to treat contractures.   Marland Kitchen HIP SURGERY     x2 , side   . IR CM INJ ANY COLONIC TUBE W/FLUORO  05/28/2017  . IR CM INJ ANY COLONIC TUBE W/FLUORO  07/07/2019  . IR CM INJ ANY COLONIC TUBE W/FLUORO  09/18/2019  . IR GASTR TUBE CONVERT GASTR-JEJ PER W/FL MOD SED  03/14/2019  . IR GENERIC HISTORICAL  07/01/2016   IR GASTR TUBE CONVERT GASTR-JEJ PER W/FL MOD SED 07/01/2016 [name], MD [facility]INTER[facility] RAD  . IR GENERIC HISTORICAL  07/08/2016   IR PATIENT EVAL TECH 0-60 MINS 07/08/2016 [name], MD [facility]  . IR GENERIC HISTORICAL  07/14/2016   IR GJ TUBE CHANGE 07/14/2016 [name], MD [facility]  . IR GENERIC HISTORICAL  07/21/2016   IR PATIENT EVAL TECH 0-60 MINS [facility]  . IR GENERIC HISTORICAL  08/31/2016   IR Dalton DUODEN/JEJUNO TUBE PERCUT W/FLUORO 08/31/2016 [name], MD [facility]  . IR GENERIC HISTORICAL  09/03/2016   IR GJ TUBE CHANGE 09/03/2016 [name], MD [facility]  .  IR GENERIC HISTORICAL  09/10/2016   IR GASTR TUBE CONVERT GASTR-JEJ PER W/FL MOD SED 09/10/2016 [facility]  . IR GENERIC HISTORICAL  09/16/2016   IR PATIENT EVAL TECH 0-60 MINS [facility]  . IR GENERIC HISTORICAL  09/29/2016   IR GJ TUBE CHANGE 09/29/2016 [name], MD [facility]  . IR GJ TUBE CHANGE  02/05/2017  . IR GJ TUBE CHANGE  05/21/2017  . IR GJ TUBE CHANGE  08/25/2017  . IR GJ TUBE CHANGE  01/18/2018  . IR GJ TUBE CHANGE  02/04/2018  . IR GJ TUBE CHANGE  04/21/2018  . IR GJ TUBE CHANGE  08/18/2018  . IR GJ TUBE CHANGE  09/12/2019  . IR GJ TUBE CHANGE  01/03/2020  . IR MECH REMOV OBSTRUC MAT ANY  COLON TUBE W/FLUORO  09/02/2018  . IR REPLC GASTRO/COLONIC TUBE PERCUT W/FLUORO  10/17/2018  . PEG PLACEMENT  10/21/2011   Procedure: PERCUTANEOUS ENDOSCOPIC GASTROSTOMY (PEG) REPLACEMENT;  Surgeon: [name], MD;  Location: [facility] ENDOSCOPY;  Service: Endoscopy;  Laterality: N/A;  . PEG PLACEMENT N/A 06/13/2013   Procedure: PERCUTANEOUS ENDOSCOPIC GASTROSTOMY (PEG) REPLACEMENT;  Surgeon: [name], MD;  Location: [facility] ENDOSCOPY;  Service: Endoscopy;  Laterality: N/A;  . SPINAL FUSION    . spinal fusion to correct 70 degree kyphosis  11-2010  . spinal fusioncorrect 106 degree kyphosis    . SPINE SURGERY  ,11/20/2010, 2011   for correction of severe contracturing spinal kyphosis.   . TONSILLECTOMY      Family History  Problem Relation Age of Onset  . Asthma Mother   . Hyperlipidemia Mother   . COPD Mother   . Other Mother        bronchial stasis/ABPA  . Cancer Maternal Grandmother 23       breast  . Hyperlipidemia Maternal Grandmother   . Hypertension Maternal Grandmother   . Cancer Maternal Grandfather        prostate  . Heart disease Paternal Grandfather        CHF  . Osteoporosis Paternal Grandmother   . Arthritis Paternal Grandmother        rheumatoid    Social History   Socioeconomic History  . Marital status: Single    Spouse name: Not on file  . Number of children: 0  . Years of education: Not on file  . Highest education level: Not on file  Occupational History  . Occupation: disbaled  Tobacco Use  . Smoking status: Never Smoker  . Smokeless tobacco: Never Used  Substance and Sexual Activity  . Alcohol use: No  . Drug use: No  . Sexual activity: Never  Other Topics Concern  . Not on file  Social History Narrative  . Not on file   Social Determinants of Health   Financial Resource Strain:   . Difficulty of Paying Living Expenses:   Food Insecurity:   . Worried About Charity fundraiser in the Last Year:   . Arboriculturist in the Last Year:     Transportation Needs:   . Film/video editor (Medical):   Marland Kitchen Lack of Transportation (Non-Medical):   Physical Activity:   . Days of Exercise per Week:   . Minutes of Exercise per Session:   Stress:   . Feeling of Stress :   Social Connections:   . Frequency of Communication with Friends and Family:   . Frequency of Social Gatherings with Friends and Family:   . Attends Religious  Services:   . Active Member of Clubs or Organizations:   . Attends Archivist Meetings:   Marland Kitchen Marital Status:   Intimate Partner Violence:   . Fear of Current or Ex-Partner:   . Emotionally Abused:   Marland Kitchen Physically Abused:   . Sexually Abused:     Outpatient Medications Prior to Visit  Medication Sig Dispense Refill  . AMBULATORY NON FORMULARY MEDICATION Medication Name: MIC gastrostomy/bolus feeding tube 24 French Part number 0110-24. #2 and 10 cc lurer lock syringe #2 Dx: 4 Device 2  . bacitracin 500 UNIT/GM ointment Apply 1 application topically 2 (two) times daily. (Patient taking differently: Apply 1 application topically as needed. ) 30 g 1  . clotrimazole-betamethasone (LOTRISONE) cream Apply 1 application topically 2 (two) times daily. (Patient taking differently: Apply 1 application topically as needed. ) 45 g 1  . dantrolene (DANTRIUM) 50 MG capsule Take 1 capsule (50 mg total) by mouth 3 (three) times daily. 90 capsule 6  . divalproex (DEPAKOTE SPRINKLE) 125 MG capsule Take 2 capsules in morning, 5 capsules at bedtime (Patient taking differently: 250-625 mg. Take 2 capsules in morning, 5 capsules at bedtime) 210 capsule 4  . Incontinence Supply Disposable (PREVAIL BREEZERS MEDIUM) MISC pkg of 16- size medium 32" to 44"  Breathable cloth-like outer fabric (can't use the plastic outer surgace  Item # [id number] 16 each 6  . LORazepam (ATIVAN) 1 MG tablet Take one tablet in evenings as needed for insomnia. (Patient taking differently: 1.5 mg. Take and one half tablet in evenings for  insomnia.) 30 tablet 1  . Misc. Devices (ALL-BODY MASSAGE) MISC 1 Units/hr by Does not apply route as needed. Full body massage for Muscle spasticity due to Cerebral palsy 99 each 99  . mupirocin ointment (BACTROBAN) 2 % Apply to area thin film twice daily if needed (Patient taking differently: as needed. Apply to area thin film twice daily if needed) 22 g 0  . NON FORMULARY Bard Leg Bag Extension tubing w/Connector 18", Sterile, latex-free  Item# [id number]    . NON FORMULARY Colorplast Freedom Cath Latex Self-Adhering Male External Catheter 76mm Diameter Intermediate  Item# [id number]    . NONFORMULARY OR COMPOUNDED ITEM Covidien REF [id number] - Kangaroo Joey Pump Set with Flush Bags - 1000 mL 1 each 0  . Nutritional Supplements (FEEDING SUPPLEMENT, KATE FARMS STANDARD 1.4,) LIQD liquid Take 325 mLs by mouth as directed. 3 carton over 16 hrs    . OLANZapine (ZYPREXA) 2.5 MG tablet Place 5 mg into feeding tube 2 (two) times daily. 2.5 mg in the am and 5 mg BID  3  . Omeprazole-Sodium Bicarbonate (ZEGERID) 20-1100 MG CAPS capsule Take 1 capsule by mouth daily before breakfast.    . Ostomy Supplies (PROTECTIVE BARRIER WIPES) MISC 1-1/4" X 3"  Item ID:[id number] 75 each 6  . PARoxetine (PAXIL) 10 MG tablet Take 15 mg by mouth daily.    . sucralfate (CARAFATE) 1 g tablet TAKE 1 TABLET BY MOUTH 4 TIMES DAILY - WITH MEALS AND AT BEDTIME. (Patient taking differently: as needed. ) 120 tablet 1   No facility-administered medications prior to visit.    Allergies  Allergen Reactions  . Ambien [Zolpidem Tartrate] Nausea Only  . Antihistamines, Chlorpheniramine-Type     Other reaction(s): Other (See Comments) Other Reaction: agitation  . Codeine Other (See Comments)    Makes patient too active after a few days.  . Metoclopramide Other (See Comments)    Delusion, emotionality   . Baclofen  Anxiety  . Pheniramine Rash    Other reaction(s): Other (See Comments) Other Reaction: agitation  . Sulfa Antibiotics  Rash  . Sulfonamide Derivatives Rash    Review of Systems  Constitutional: Positive for malaise/fatigue. Negative for fever.  HENT: Positive for congestion.   Eyes: Negative for blurred vision.  Respiratory: Positive for cough. Negative for shortness of breath.   Cardiovascular: Positive for chest pain. Negative for palpitations and leg swelling.  Gastrointestinal: Positive for abdominal pain. Negative for blood in stool, diarrhea and nausea.  Genitourinary: Negative for dysuria and frequency.  Musculoskeletal: Negative for falls.  Skin: Negative for rash.  Neurological: Negative for dizziness, loss of consciousness and headaches.  Endo/Heme/Allergies: Negative for environmental allergies.  Psychiatric/Behavioral: Negative for depression. The patient is not nervous/anxious.        Objective:    Physical Exam Constitutional:      Appearance: He is not ill-appearing.  HENT:     Head: Normocephalic and atraumatic.     Right Ear: External ear normal.     Left Ear: External ear normal.     Nose: Nose normal.  Pulmonary:     Effort: Pulmonary effort is normal.     Comments: No nasal flaring. Neurological:     Mental Status: He is alert.     Comments: In bed  Psychiatric:        Behavior: Behavior normal.     BP 96/67   Pulse (!) 103   Temp 98.1 F (36.7 C)   Wt 92 lb (41.7 kg)   SpO2 96%   BMI 17.97 kg/m  Wt Readings from Last 3 Encounters:  03/28/20 92 lb (41.7 kg)  08/30/19 105 lb (47.6 kg)  04/12/19 105 lb (47.6 kg)    Diabetic Foot Exam - Simple   No data filed     Lab Results  Component Value Date   WBC 5.5 02/13/2020   HGB 15.3 02/13/2020   HCT 44.5 02/13/2020   PLT 231.0 02/13/2020   GLUCOSE 93 02/13/2020   CHOL 136 02/13/2020   TRIG 76.0 02/13/2020   HDL 31.80 (L) 02/13/2020   LDLCALC 89 02/13/2020   ALT 36 02/13/2020   AST 24 02/13/2020   NA 137 02/13/2020   K 3.8 02/13/2020   CL 102 02/13/2020   CREATININE 0.33 (L) 02/13/2020   BUN 9  02/13/2020   CO2 26 02/13/2020   TSH 1.61 02/13/2020   HGBA1C 5.0 04/29/2007    Lab Results  Component Value Date   TSH 1.61 02/13/2020   Lab Results  Component Value Date   WBC 5.5 02/13/2020   HGB 15.3 02/13/2020   HCT 44.5 02/13/2020   MCV 94.3 02/13/2020   PLT 231.0 02/13/2020   Lab Results  Component Value Date   NA 137 02/13/2020   K 3.8 02/13/2020   CO2 26 02/13/2020   GLUCOSE 93 02/13/2020   BUN 9 02/13/2020   CREATININE 0.33 (L) 02/13/2020   BILITOT 0.4 02/13/2020   ALKPHOS 104 02/13/2020   AST 24 02/13/2020   ALT 36 02/13/2020   PROT 7.6 02/13/2020   ALBUMIN 4.5 02/13/2020   CALCIUM 9.3 02/13/2020   ANIONGAP 11 09/18/2014   GFR 304.05 02/13/2020   Lab Results  Component Value Date   CHOL 136 02/13/2020   Lab Results  Component Value Date   HDL 31.80 (L) 02/13/2020   Lab Results  Component Value Date   LDLCALC 89 02/13/2020   Lab Results  Component Value Date  TRIG 76.0 02/13/2020   Lab Results  Component Value Date   CHOLHDL 4 02/13/2020   Lab Results  Component Value Date   HGBA1C 5.0 04/29/2007       Assessment & Plan:   Problem List Items Addressed This Visit    Depression with anxiety    Mother reports he has been declining over several months but this has accelerated some.       Urinary incontinence - Primary   Relevant Orders   Urinalysis (Completed)   Urine Culture (Completed)   Dysphagia, oropharyngeal phase    PEG tube in LUQ in place and functioning well      Pneumonia    Patient complains of lower chest/upper abdominal pain and xray confirms pneumonia patient started on Augmentin and mother will let us know if no improvement.        Other Visit Diagnoses    Pleural effusion on right       Relevant Orders   DG Chest 2 View (Completed)   Chest pain, unspecified type       Relevant Orders   DG Chest 2 View (Completed)      I am having Chad S. Nordmeyer "[name]" maintain his divalproex, Omeprazole-Sodium  Bicarbonate, NON FORMULARY, NON FORMULARY, Protective Barrier Wipes, Prevail Breezers Medium, bacitracin, NONFORMULARY OR COMPOUNDED ITEM, LORazepam, PARoxetine, AMBULATORY NON FORMULARY MEDICATION, OLANZapine, mupirocin ointment, All-Body Massage, sucralfate, clotrimazole-betamethasone, dantrolene, and feeding supplement (KATE FARMS STANDARD 1.4).  No orders of the defined types were placed in this encounter.  I discussed the assessment and treatment plan with the patient. The patient was provided an opportunity to ask questions and all were answered. The patient agreed with the plan and demonstrated an understanding of the instructions.   The patient was advised to call back or seek an in-person evaluation if the symptoms worsen or if the condition fails to improve as anticipated.  I provided 20 minutes of non-face-to-face time during this encounter.   [name], MD

## 2020-04-01 NOTE — Assessment & Plan Note (Signed)
Mother reports he has been declining over several months but this has accelerated some.

## 2020-04-01 NOTE — Assessment & Plan Note (Signed)
Patient complains of lower chest/upper abdominal pain and xray confirms pneumonia patient started on Augmentin and mother will let us know if no improvement.

## 2020-04-02 ENCOUNTER — Telehealth: Payer: Self-pay | Admitting: Family Medicine

## 2020-04-02 MED ORDER — DOXYCYCLINE HYCLATE 100 MG PO TABS: ORAL_TABLET | ORAL | 0 refills | Status: DC

## 2020-04-02 NOTE — Telephone Encounter (Signed)
OK, d/c Augmentin and start Doxycycline via peg tube bid x 10 days

## 2020-04-02 NOTE — Telephone Encounter (Signed)
Mom notified of change.  New rx sent in.

## 2020-04-02 NOTE — Telephone Encounter (Signed)
Caller [name]  Call Back # [phone]   [name] states that patient was dx with pneumonia last week, per mother patient started Augmentin 875-125 mg. Patient watery diarrhea x time 3days. Per mother she has held medication yesterday.    Please Advise

## 2020-04-05 NOTE — Telephone Encounter (Signed)
Patient scheduled for next thursday

## 2020-04-11 ENCOUNTER — Telehealth (INDEPENDENT_AMBULATORY_CARE_PROVIDER_SITE_OTHER): Payer: Medicare Other | Admitting: Family Medicine

## 2020-04-11 ENCOUNTER — Other Ambulatory Visit: Payer: Self-pay

## 2020-04-11 VITALS — HR 93

## 2020-04-11 DIAGNOSIS — F418 Other specified anxiety disorders: Secondary | ICD-10-CM

## 2020-04-11 DIAGNOSIS — G809 Cerebral palsy, unspecified: Secondary | ICD-10-CM | POA: Diagnosis not present

## 2020-04-11 DIAGNOSIS — E43 Unspecified severe protein-calorie malnutrition: Secondary | ICD-10-CM

## 2020-04-11 DIAGNOSIS — J189 Pneumonia, unspecified organism: Secondary | ICD-10-CM

## 2020-04-15 ENCOUNTER — Ambulatory Visit (HOSPITAL_BASED_OUTPATIENT_CLINIC_OR_DEPARTMENT_OTHER)
Admission: RE | Admit: 2020-04-15 | Discharge: 2020-04-15 | Disposition: A | Discharge status: Home / Self Care | Payer: Medicare Other | Source: Ambulatory Visit | Attending: Family Medicine | Admitting: Family Medicine

## 2020-04-15 ENCOUNTER — Telehealth: Payer: Self-pay | Admitting: Family Medicine

## 2020-04-15 ENCOUNTER — Other Ambulatory Visit: Payer: Self-pay

## 2020-04-15 DIAGNOSIS — J90 Pleural effusion, not elsewhere classified: Secondary | ICD-10-CM | POA: Diagnosis not present

## 2020-04-15 DIAGNOSIS — J189 Pneumonia, unspecified organism: Secondary | ICD-10-CM | POA: Diagnosis not present

## 2020-04-15 NOTE — Assessment & Plan Note (Signed)
He needs a new assistive device for communication. He has not had a new device in years. They are ready to proceed with a new device and he will work with therapist to configure new device.

## 2020-04-15 NOTE — Assessment & Plan Note (Signed)
Has consultation with nutrition and feedings are being adjusted.

## 2020-04-15 NOTE — Telephone Encounter (Signed)
Caller : [name] .[name])   Call Back # [phone]  Per patient's mother .Marland KitchenMarland KitchenMarland KitchenMychart visit on June 10,2021. [name] states Charlett Blake stated that she would  Put a Xray in for July to see if Pneumonia has left.   [name] states patient is coughing and complaining of chest pain. Patient has completed medication antibiotics last Friday  . [name] is also coughing and has a sore throat.

## 2020-04-15 NOTE — Progress Notes (Signed)
Virtual Visit via Video Note  I connected with [name] on 04/11/20 at  3:00 PM EDT by a video enabled telemedicine application and verified that I am speaking with the correct person using two identifiers.  Location: Patient: home with mother Provider: office   I discussed the limitations of evaluation and management by telemedicine and the availability of in person appointments. The patient expressed understanding and agreed to proceed. Kem Boroughs, CMA was able to setup a video visit    Subjective:    Patient ID: [name], male    DOB: 06/12/1986, 34 y.o.   MRN: [id number]  Chief Complaint  Patient presents with  . Follow-up  . discuss communication device    HPI Patient is in today for follow up on pneumonia. He is at home with his mother and he is doing much better, he is sitting up in his wheelchair and much more interactive than he was in his last visit. Is tolerating his feeds. No fevers or chills. Denies CP/palp/SOB/HA/congestion/fevers/GI or GU c/o. Taking meds as prescribed  Past Medical History:  Diagnosis Date  . Cerebral palsy ([place])   . Dehydration 11/22/2013  . Depression with anxiety 08/01/2010   Qualifier: Diagnosis of  By: Nelson-Smith CMA (AAMA), Dottie    . Dyslipidemia 08/19/2017  . Esophagitis 2011  . 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 GERD (gastroesophageal reflux disease)   . Hyperlipidemia, mild 08/25/2015  . Hyperthyroidism   . Incontinence of feces   . Loss of weight 08/28/2014  . Medicare annual wellness visit, subsequent 08/25/2015  . Mildly underweight adult 03/16/2017  . Palpitations   . Skin lesion of right ear 03/16/2017  . Thyroid disease 08/01/2010   Qualifier: Diagnosis of  By: Harlon Ditty CMA (AAMA), Dottie      Past Surgical History:  Procedure Laterality Date  . baclofen trial    . baslofen pump implant    . ears tubes    . EYE SURGERY    . FLEXIBLE SIGMOIDOSCOPY N/A 09/07/2014   Procedure: FLEXIBLE  SIGMOIDOSCOPY;  Surgeon: [name], MD;  Location: [facility] ENDOSCOPY;  Service: Endoscopy;  Laterality: N/A;  . g-tube insert  August 2006  . hamstring released     to treat contractures.   Marland Kitchen HIP SURGERY     x2 , side   . IR CM INJ ANY COLONIC TUBE W/FLUORO  05/28/2017  . IR CM INJ ANY COLONIC TUBE W/FLUORO  07/07/2019  . IR CM INJ ANY COLONIC TUBE W/FLUORO  09/18/2019  . IR GASTR TUBE CONVERT GASTR-JEJ PER W/FL MOD SED  03/14/2019  . IR GENERIC HISTORICAL  07/01/2016   IR GASTR TUBE CONVERT GASTR-JEJ PER W/FL MOD SED 07/01/2016 [name], MD [facility]INTER[facility] RAD  . IR GENERIC HISTORICAL  07/08/2016   IR PATIENT EVAL TECH 0-60 MINS 07/08/2016 [name], MD [facility]  . IR GENERIC HISTORICAL  07/14/2016   IR GJ TUBE CHANGE 07/14/2016 [name], MD [facility]  . IR GENERIC HISTORICAL  07/21/2016   IR PATIENT EVAL TECH 0-60 MINS [facility]  . IR GENERIC HISTORICAL  08/31/2016   IR Harlan DUODEN/JEJUNO TUBE PERCUT W/FLUORO 08/31/2016 [name], MD [facility]  . IR GENERIC HISTORICAL  09/03/2016   IR GJ TUBE CHANGE 09/03/2016 [name], MD [facility]  . IR GENERIC HISTORICAL  09/10/2016   IR GASTR TUBE CONVERT GASTR-JEJ PER W/FL MOD SED 09/10/2016 [facility]  . IR GENERIC HISTORICAL  09/16/2016   IR PATIENT EVAL  TECH 0-60 MINS [facility]  . IR GENERIC HISTORICAL  09/29/2016   IR GJ TUBE CHANGE 09/29/2016 [name], MD [facility]  . IR GJ TUBE CHANGE  02/05/2017  . IR GJ TUBE CHANGE  05/21/2017  . IR GJ TUBE CHANGE  08/25/2017  . IR GJ TUBE CHANGE  01/18/2018  . IR GJ TUBE CHANGE  02/04/2018  . IR GJ TUBE CHANGE  04/21/2018  . IR GJ TUBE CHANGE  08/18/2018  . IR GJ TUBE CHANGE  09/12/2019  . IR GJ TUBE CHANGE  01/03/2020  . IR MECH REMOV OBSTRUC MAT ANY COLON TUBE W/FLUORO  09/02/2018  . IR REPLC GASTRO/COLONIC TUBE PERCUT W/FLUORO  10/17/2018  . PEG PLACEMENT  10/21/2011   Procedure: PERCUTANEOUS ENDOSCOPIC GASTROSTOMY (PEG) REPLACEMENT;  Surgeon: [name], MD;   Location: [facility] ENDOSCOPY;  Service: Endoscopy;  Laterality: N/A;  . PEG PLACEMENT N/A 06/13/2013   Procedure: PERCUTANEOUS ENDOSCOPIC GASTROSTOMY (PEG) REPLACEMENT;  Surgeon: [name], MD;  Location: [facility] ENDOSCOPY;  Service: Endoscopy;  Laterality: N/A;  . SPINAL FUSION    . spinal fusion to correct 70 degree kyphosis  11-2010  . spinal fusioncorrect 106 degree kyphosis    . SPINE SURGERY  ,11/20/2010, 2011   for correction of severe contracturing spinal kyphosis.   . TONSILLECTOMY      Family History  Problem Relation Age of Onset  . Asthma Mother   . Hyperlipidemia Mother   . COPD Mother   . Other Mother        bronchial stasis/ABPA  . Cancer Maternal Grandmother 23       breast  . Hyperlipidemia Maternal Grandmother   . Hypertension Maternal Grandmother   . Cancer Maternal Grandfather        prostate  . Heart disease Paternal Grandfather        CHF  . Osteoporosis Paternal Grandmother   . Arthritis Paternal Grandmother        rheumatoid    Social History   Socioeconomic History  . Marital status: Single    Spouse name: Not on file  . Number of children: 0  . Years of education: Not on file  . Highest education level: Not on file  Occupational History  . Occupation: disbaled  Tobacco Use  . Smoking status: Never Smoker  . Smokeless tobacco: Never Used  Substance and Sexual Activity  . Alcohol use: No  . Drug use: No  . Sexual activity: Never  Other Topics Concern  . Not on file  Social History Narrative  . Not on file   Social Determinants of Health   Financial Resource Strain:   . Difficulty of Paying Living Expenses:   Food Insecurity:   . Worried About Charity fundraiser in the Last Year:   . Arboriculturist in the Last Year:   Transportation Needs:   . Film/video editor (Medical):   Marland Kitchen Lack of Transportation (Non-Medical):   Physical Activity:   . Days of Exercise per Week:   . Minutes of Exercise per Session:   Stress:   . Feeling of  Stress :   Social Connections:   . Frequency of Communication with Friends and Family:   . Frequency of Social Gatherings with Friends and Family:   . Attends Religious Services:   . Active Member of Clubs or Organizations:   . Attends Archivist Meetings:   Marland Kitchen Marital Status:   Intimate Partner Violence:   . Fear  of Current or Ex-Partner:   . Emotionally Abused:   Marland Kitchen Physically Abused:   . Sexually Abused:     Outpatient Medications Prior to Visit  Medication Sig Dispense Refill  . AMBULATORY NON FORMULARY MEDICATION Medication Name: MIC gastrostomy/bolus feeding tube 24 French Part number 0110-24. #2 and 10 cc lurer lock syringe #2 Dx: 4 Device 2  . bacitracin 500 UNIT/GM ointment Apply 1 application topically 2 (two) times daily. (Patient taking differently: Apply 1 application topically as needed. ) 30 g 1  . clotrimazole-betamethasone (LOTRISONE) cream Apply 1 application topically 2 (two) times daily. (Patient taking differently: Apply 1 application topically as needed. ) 45 g 1  . dantrolene (DANTRIUM) 50 MG capsule Take 1 capsule (50 mg total) by mouth 3 (three) times daily. 90 capsule 6  . divalproex (DEPAKOTE SPRINKLE) 125 MG capsule Take 2 capsules in morning, 5 capsules at bedtime (Patient taking differently: 250-625 mg. Take 2 capsules in morning, 5 capsules at bedtime) 210 capsule 4  . doxycycline (VIBRA-TABS) 100 MG tablet Crush 1 tablet via peg tube twice a day for 10 days 20 tablet 0  . Incontinence Supply Disposable (PREVAIL BREEZERS MEDIUM) MISC pkg of 16- size medium 32" to 44"  Breathable cloth-like outer fabric (can't use the plastic outer surgace  Item # [id number] 16 each 6  . LORazepam (ATIVAN) 1 MG tablet Take one tablet in evenings as needed for insomnia. (Patient taking differently: 1.5 mg. Take and one half tablet in evenings for insomnia.) 30 tablet 1  . Misc. Devices (ALL-BODY MASSAGE) MISC 1 Units/hr by Does not apply route as needed. Full body  massage for Muscle spasticity due to Cerebral palsy 99 each 99  . mupirocin ointment (BACTROBAN) 2 % Apply to area thin film twice daily if needed (Patient taking differently: as needed. Apply to area thin film twice daily if needed) 22 g 0  . NON FORMULARY Bard Leg Bag Extension tubing w/Connector 18", Sterile, latex-free  Item# [id number]    . NON FORMULARY Colorplast Freedom Cath Latex Self-Adhering Male External Catheter 90mm Diameter Intermediate  Item# [id number]    . NONFORMULARY OR COMPOUNDED ITEM [employer] REF [id number] - Kangaroo Joey Pump Set with Flush Bags - 1000 mL 1 each 0  . Nutritional Supplements (FEEDING SUPPLEMENT, KATE FARMS STANDARD 1.4,) LIQD liquid Take 325 mLs by mouth as directed. 3 carton over 16 hrs    . OLANZapine (ZYPREXA) 2.5 MG tablet Place 5 mg into feeding tube 2 (two) times daily. 2.5 mg in the am and 5 mg BID  3  . Omeprazole-Sodium Bicarbonate (ZEGERID) 20-1100 MG CAPS capsule Take 1 capsule by mouth daily before breakfast.    . Ostomy Supplies (PROTECTIVE BARRIER WIPES) MISC 1-1/4" X 3"  Item #[id number] 75 each 6  . PARoxetine (PAXIL) 10 MG tablet Take 15 mg by mouth daily.    . sucralfate (CARAFATE) 1 g tablet TAKE 1 TABLET BY MOUTH 4 TIMES DAILY - WITH MEALS AND AT BEDTIME. (Patient taking differently: as needed. ) 120 tablet 1   No facility-administered medications prior to visit.    Allergies  Allergen Reactions  . Ambien [Zolpidem Tartrate] Nausea Only  . Antihistamines, Chlorpheniramine-Type     Other reaction(s): Other (See Comments) Other Reaction: agitation  . Augmentin [Amoxicillin-Pot Clavulanate] Diarrhea  . Codeine Other (See Comments)    Makes patient too active after a few days.  . Metoclopramide Other (See Comments)    Delusion, emotionality   . Baclofen Anxiety  . Pheniramine  Rash    Other reaction(s): Other (See Comments) Other Reaction: agitation  . Sulfa Antibiotics Rash  . Sulfonamide Derivatives Rash    Review of Systems    Constitutional: Positive for malaise/fatigue. Negative for fever.  HENT: Negative for congestion.   Eyes: Negative for blurred vision.  Respiratory: Positive for shortness of breath.   Cardiovascular: Negative for chest pain, palpitations and leg swelling.  Gastrointestinal: Negative for abdominal pain, blood in stool and nausea.  Genitourinary: Negative for dysuria and frequency.  Musculoskeletal: Negative for falls.  Skin: Negative for rash.  Neurological: Negative for dizziness, loss of consciousness and headaches.  Endo/Heme/Allergies: Negative for environmental allergies.  Psychiatric/Behavioral: Negative for depression. The patient is not nervous/anxious.        Objective:    Physical Exam Constitutional:      Appearance: Normal appearance. He is not ill-appearing.  HENT:     Head: Normocephalic and atraumatic.     Right Ear: External ear normal.     Left Ear: External ear normal.     Nose: Nose normal.  Eyes:     General:        Right eye: No discharge.        Left eye: No discharge.  Neurological:     Mental Status: He is alert and oriented to person, place, and time.     Comments: In wheelchair  Psychiatric:        Behavior: Behavior normal.     Pulse 93   SpO2 97%  Wt Readings from Last 3 Encounters:  03/28/20 92 lb (41.7 kg)  08/30/19 105 lb (47.6 kg)  04/12/19 105 lb (47.6 kg)    Diabetic Foot Exam - Simple   No data filed     Lab Results  Component Value Date   WBC 5.5 02/13/2020   HGB 15.3 02/13/2020   HCT 44.5 02/13/2020   PLT 231.0 02/13/2020   GLUCOSE 93 02/13/2020   CHOL 136 02/13/2020   TRIG 76.0 02/13/2020   HDL 31.80 (L) 02/13/2020   LDLCALC 89 02/13/2020   ALT 36 02/13/2020   AST 24 02/13/2020   NA 137 02/13/2020   K 3.8 02/13/2020   CL 102 02/13/2020   CREATININE 0.33 (L) 02/13/2020   BUN 9 02/13/2020   CO2 26 02/13/2020   TSH 1.61 02/13/2020   HGBA1C 5.0 04/29/2007    Lab Results  Component Value Date   TSH 1.61  02/13/2020   Lab Results  Component Value Date   WBC 5.5 02/13/2020   HGB 15.3 02/13/2020   HCT 44.5 02/13/2020   MCV 94.3 02/13/2020   PLT 231.0 02/13/2020   Lab Results  Component Value Date   NA 137 02/13/2020   K 3.8 02/13/2020   CO2 26 02/13/2020   GLUCOSE 93 02/13/2020   BUN 9 02/13/2020   CREATININE 0.33 (L) 02/13/2020   BILITOT 0.4 02/13/2020   ALKPHOS 104 02/13/2020   AST 24 02/13/2020   ALT 36 02/13/2020   PROT 7.6 02/13/2020   ALBUMIN 4.5 02/13/2020   CALCIUM 9.3 02/13/2020   ANIONGAP 11 09/18/2014   GFR 304.05 02/13/2020   Lab Results  Component Value Date   CHOL 136 02/13/2020   Lab Results  Component Value Date   HDL 31.80 (L) 02/13/2020   Lab Results  Component Value Date   LDLCALC 89 02/13/2020   Lab Results  Component Value Date   TRIG 76.0 02/13/2020   Lab Results  Component Value Date   CHOLHDL  4 02/13/2020   Lab Results  Component Value Date   HGBA1C 5.0 04/29/2007       Assessment & Plan:   Problem List Items Addressed This Visit    Depression with anxiety    He is doing well on current meds as he begins to feel better.       Infantile cerebral palsy [facility])    He needs a new assistive device for communication. He has not had a new device in years. They are ready to proceed with a new device and he will work with therapist to configure new device.       Protein-calorie malnutrition, severe ([place])    Has consultation with nutrition and feedings are being adjusted.       Pneumonia - Primary    Doing much better after starting antibiotics, is up and more interactive. Continue probiotics and report if symptoms return. Repeat CXR to assess resolution      Relevant Orders   DG Chest 2 View      I am having Chad Matte "[name]" maintain his divalproex, Omeprazole-Sodium Bicarbonate, NON FORMULARY, NON FORMULARY, Protective Barrier Wipes, Prevail Breezers Medium, bacitracin, NONFORMULARY OR COMPOUNDED ITEM, LORazepam,  PARoxetine, AMBULATORY NON FORMULARY MEDICATION, OLANZapine, mupirocin ointment, All-Body Massage, sucralfate, clotrimazole-betamethasone, dantrolene, feeding supplement (KATE FARMS STANDARD 1.4), and doxycycline.  No orders of the defined types were placed in this encounter. I discussed the assessment and treatment plan with the patient. The patient was provided an opportunity to ask questions and all were answered. The patient agreed with the plan and demonstrated an understanding of the instructions.   The patient was advised to call back or seek an in-person evaluation if the symptoms worsen or if the condition fails to improve as anticipated.  I provided 20 minutes of non-face-to-face time during this encounter.   [name], MD

## 2020-04-15 NOTE — Assessment & Plan Note (Signed)
He is doing well on current meds as he begins to feel better.

## 2020-04-15 NOTE — Assessment & Plan Note (Addendum)
Doing much better after starting antibiotics, is up and more interactive. Continue probiotics and report if symptoms return. Repeat CXR to assess resolution

## 2020-04-16 ENCOUNTER — Other Ambulatory Visit: Payer: Self-pay | Admitting: Family Medicine

## 2020-04-16 DIAGNOSIS — J189 Pneumonia, unspecified organism: Secondary | ICD-10-CM

## 2020-04-16 DIAGNOSIS — R059 Cough, unspecified: Secondary | ICD-10-CM

## 2020-04-16 MED ORDER — DOXYCYCLINE HYCLATE 100 MG PO TABS: ORAL_TABLET | ORAL | 0 refills | Status: DC

## 2020-04-16 NOTE — Telephone Encounter (Signed)
I went ahead and ordered a cxr for 05/13/20 but if his cough is worsening we can send in a refill on the antibiotic to take one more time since he was better on it. Please check with them

## 2020-04-16 NOTE — Telephone Encounter (Signed)
Patient mother notified, she would like another refill and refill for antibiotic sent in.

## 2020-04-16 NOTE — Telephone Encounter (Signed)
Looks like that he had xray yesterday

## 2020-04-17 IMAGING — XA IR REPLACE G/J TUBE W/ FLUORO
1 series · 7 of 7 positions shown · non-contrast
Comparison: Fluoroscopic guided gastrojejunostomy catheter
exchange-03/14/2019

INDICATION: History of cerebral palsy with chronic feeding gastrojejunostomy
catheter, now with concern for malfunction. Please perform
fluoroscopic guided exchange.

EXAM:
FLUOROSCOPIC GUIDED REPLACEMENT OF GASTROJEJUNOSTOMY TUBE

[Series 300: ir gj tube change · 7 of 7 slices shown]
[im 1/7]
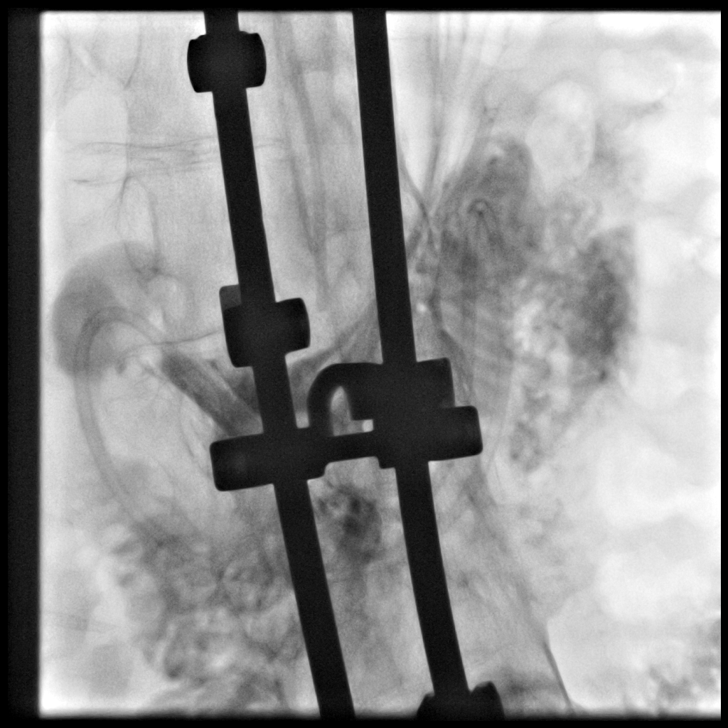
[im 2/7]
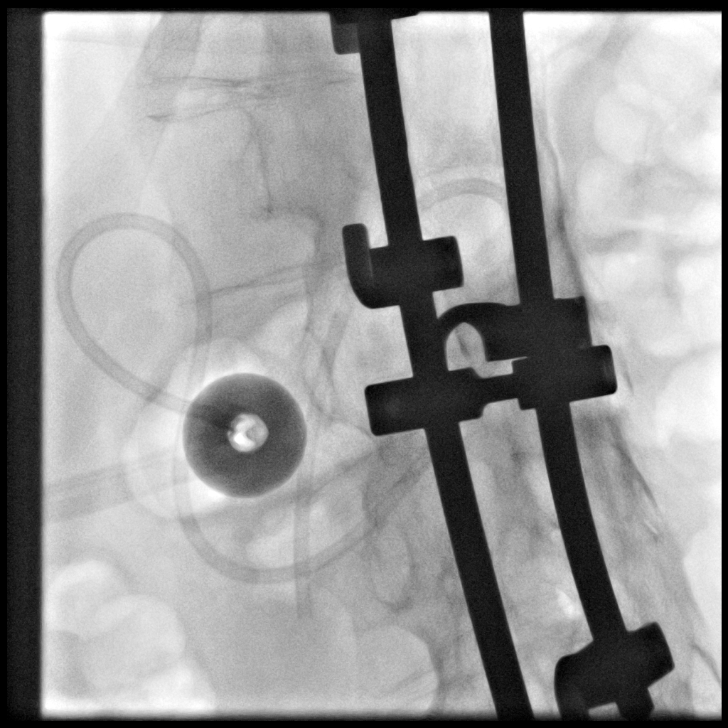
[im 3/7]
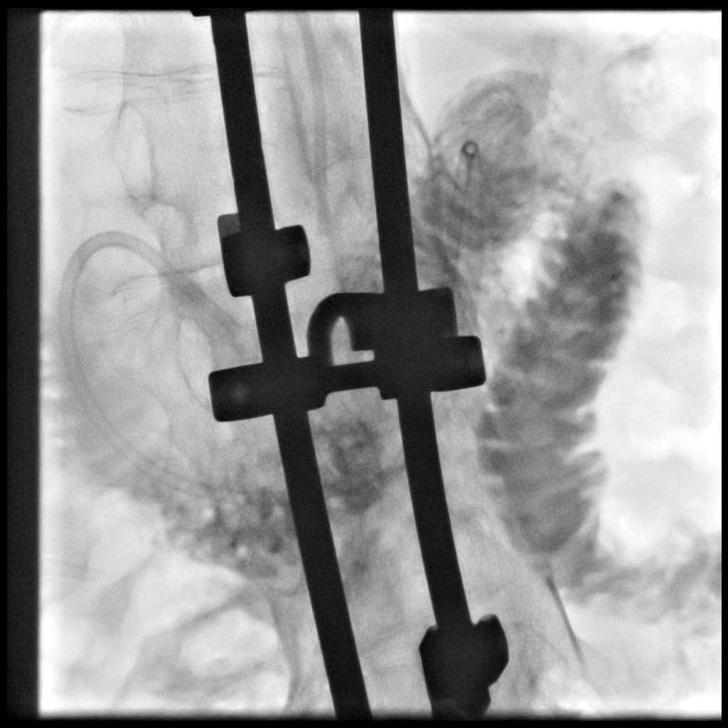
[im 4/7]
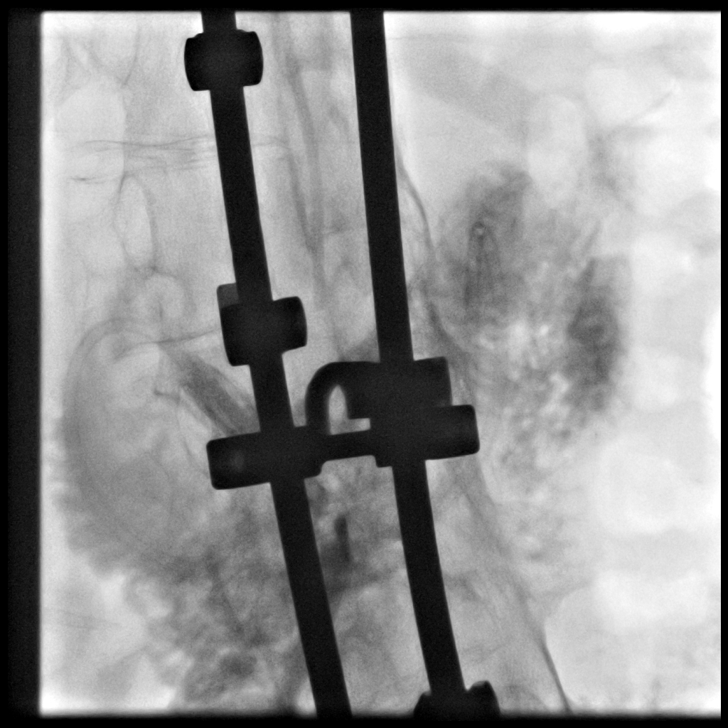
[im 5/7]
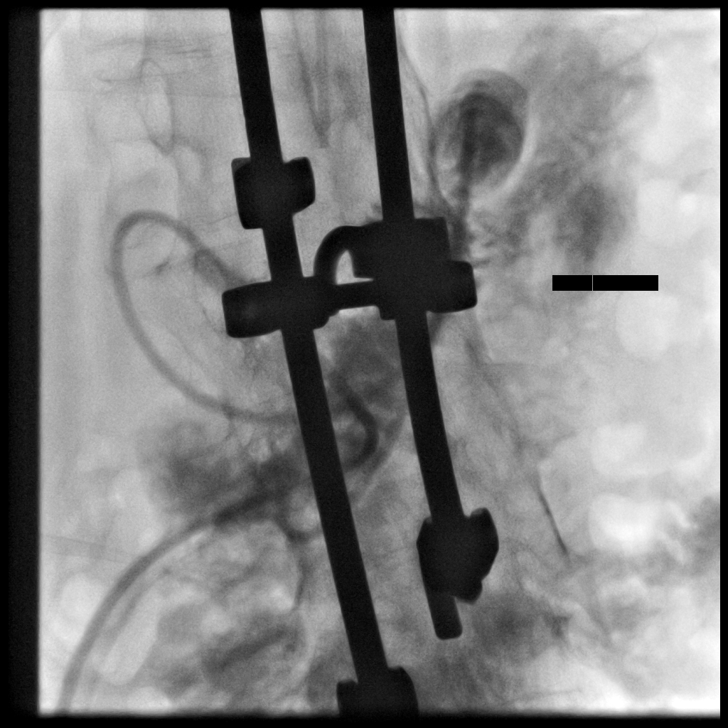
[im 6/7]
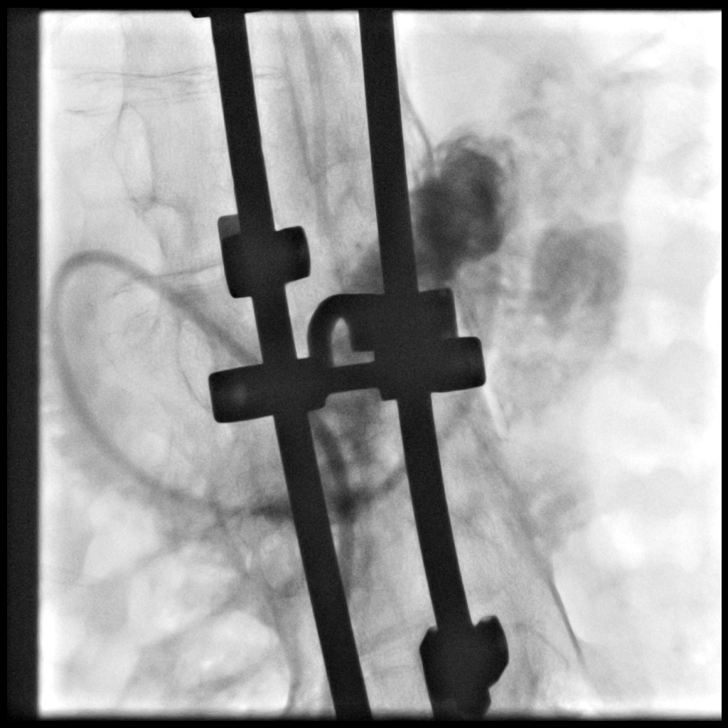
[im 7/7]
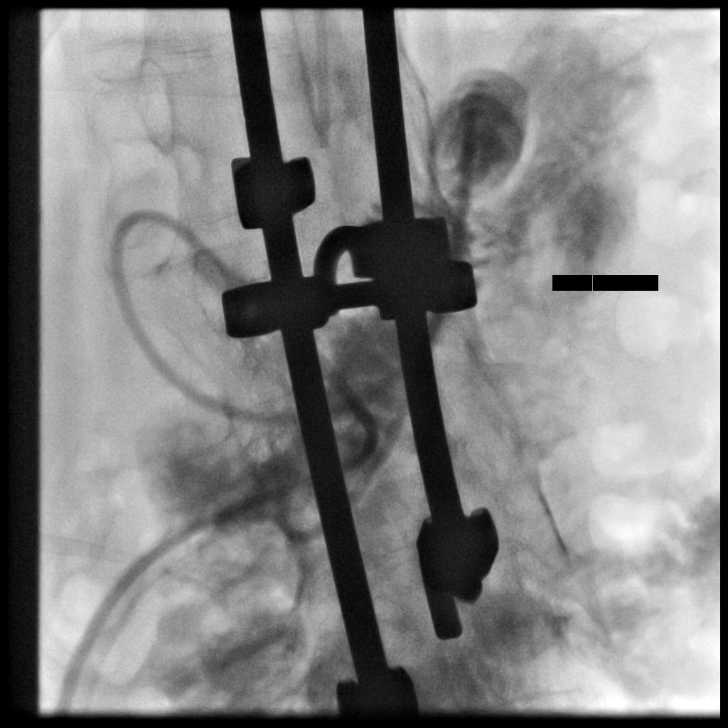

[7 of 7 positions shown; findings below may reference images not displayed]

MEDICATIONS:
None.

CONTRAST:  20mL OMNIPAQUE IOHEXOL 300 MG/ML SOLN - administered into
the gastric and jejunal lumens

FLUOROSCOPY TIME:  2 minutes (38.9 mGy)

COMPLICATIONS:
None.

PROCEDURE:
Informed written consent was obtained from patient's family after a
discussion of the risks and benefits. The upper abdomen and the
external portion of the existing gastrojejunostomy tube was prepped
and draped in the usual sterile fashion, and a sterile drape was
applied covering the operative field. Maximum barrier sterile
technique with sterile gowns and gloves were used for the procedure.
A timeout was performed prior to the initiation of the procedure.

Contrast injection of both the gastric and jejunal lumens
demonstrates appropriate position functionality.

External portion of the gastrojejunostomy catheter was cut. The
gastrojejunostomy catheter was then partially retracted and the
jejunal lumen was cut at its mid aspect and cannulated with a stiff
Glidewire which advanced through the remainder of the jejunostomy
lumen and coiled within proximal small bowel.

After the distal approximately 6 inches the catheter was trimmed,
under intermittent fluoroscopic guidance, a new 24 French
gastrojejunostomy catheter was advanced over the stiff Glidewire
with tip ultimately terminating within the proximal jejunum. The
balloon was insufflated with approximately 10 cc of saline and
dilute contrast, pulled against the ventral aspect the inter lumen
stomach and the external disc was cinched.

Contrast injection demonstrated appropriate position functionality
of both the gastrostomy and jejunostomy lumens.

A dressing was placed. The patient tolerated procedure well without
immediate postprocedural complication.
IMPRESSION: Successful fluoroscopic guided exchange of new 24 French
gastrojejunostomy catheter. Both lumens are ready for immediate
usage.

## 2020-04-18 ENCOUNTER — Other Ambulatory Visit: Payer: Self-pay | Admitting: Family Medicine

## 2020-04-18 MED ORDER — CEFDINIR 300 MG PO CAPS: 300.0000 mg | ORAL_CAPSULE | Freq: Two times a day (BID) | ORAL | 0 refills | Status: AC

## 2020-04-23 IMAGING — XA IR GI TUBE CONTRAST INJECTION
6 series · 12 of 23 positions shown · non-contrast
Comparison: none

INDICATION: 33-year-old male with cerebral palsy and a chronic indwelling
percutaneous gastrojejunostomy tube. Tube was most recently replaced
on 09/12/2019. Unfortunately, the balloon became deflated and the
tube backed out by several inches. The patient's mom advanced the
tube in reinflated the balloon. Patient now presents to ensure that
tube is in acceptable position and without issue.

[Series 3: care single · 1 of 1 slices shown]
[im 1/1]
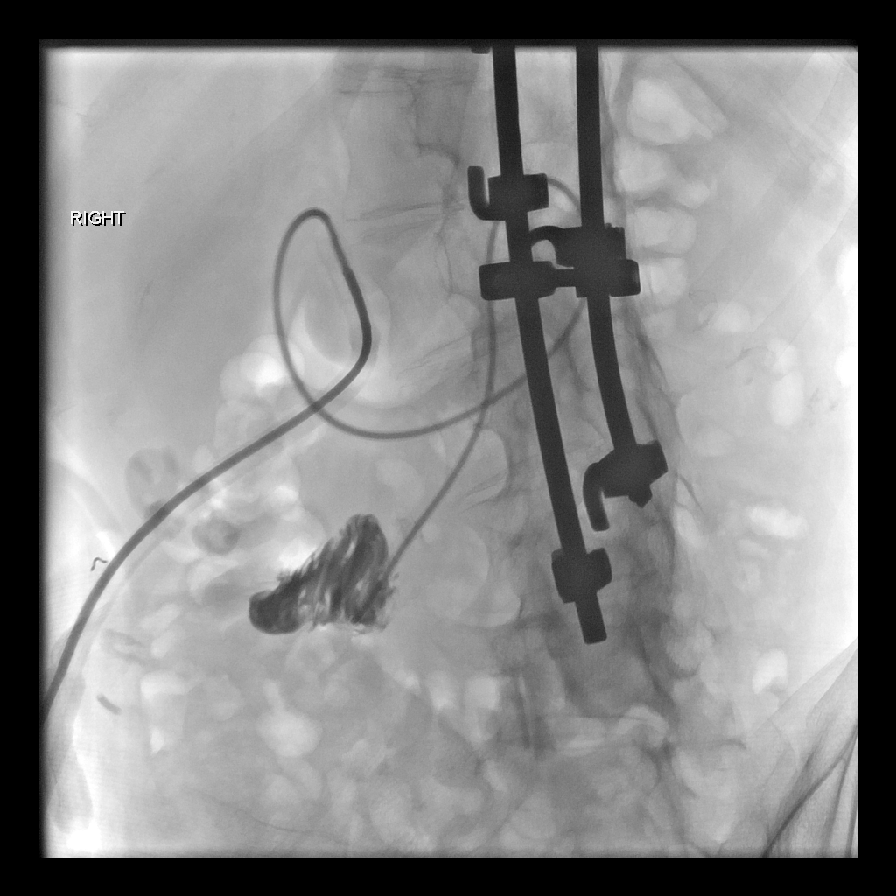

[Series 4: fl - angio · 2 of 100 frames shown (1 of 4)]
[frame 16/100]
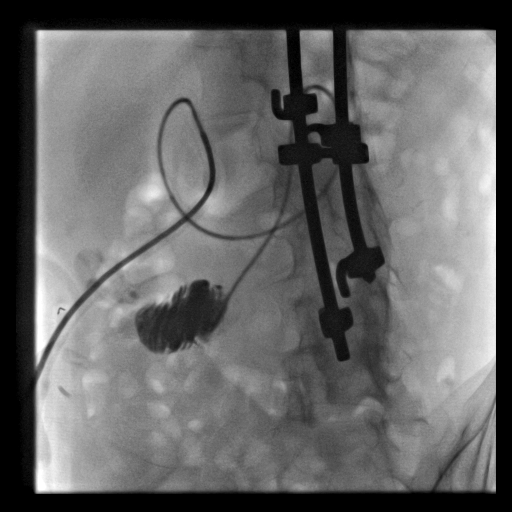
[frame 86/100]
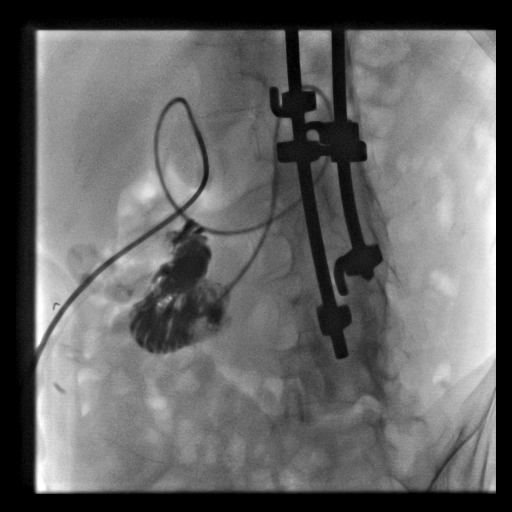

[Series 5: fl - angio · 2 of 69 frames shown (2 of 4)]
[frame 35/69]
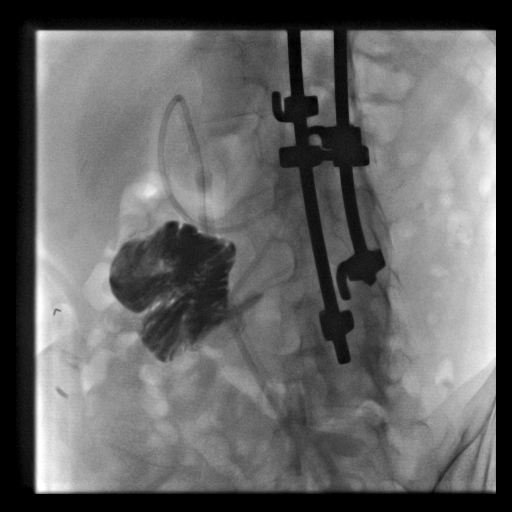
[frame 59/69]
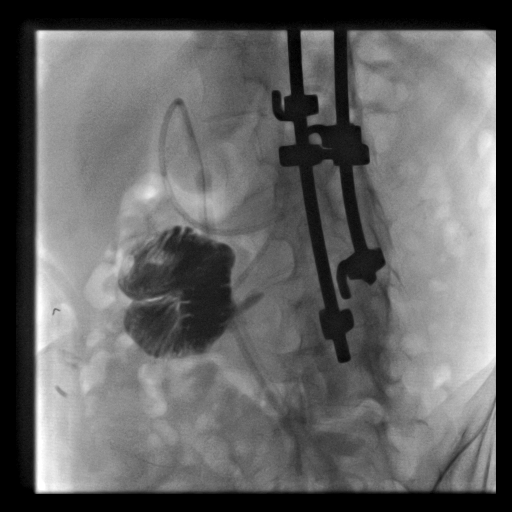

[Series 7: fl - angio · 2 of 88 frames shown (3 of 4)]
[frame 45/88]
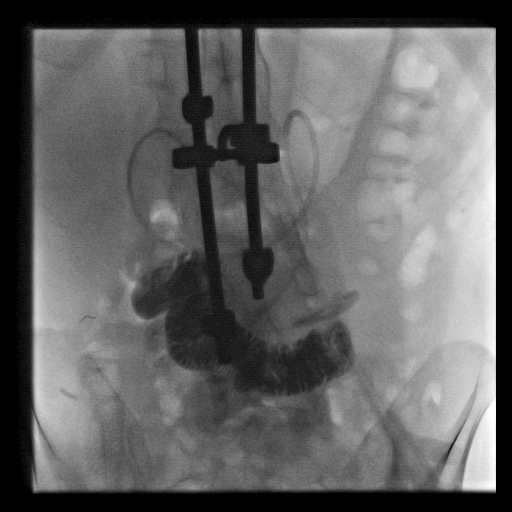
[frame 81/88]
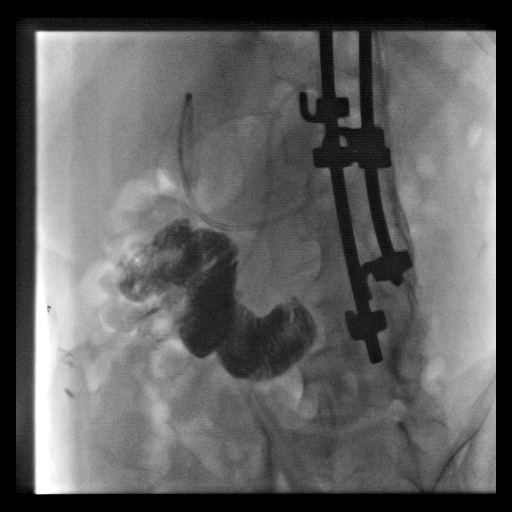

[Series 8: fl - angio · 2 of 18 frames shown (4 of 4)]
[frame 5/18]
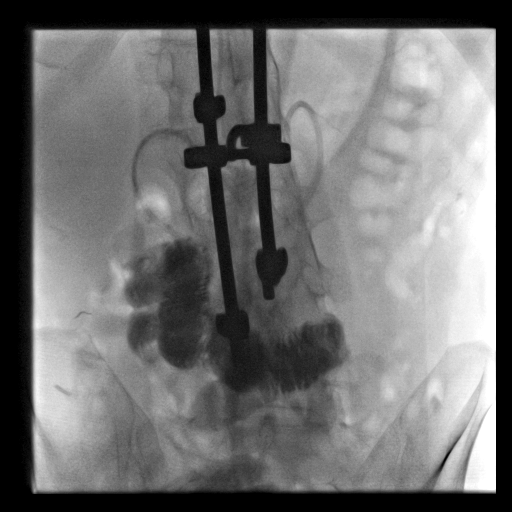
[frame 16/18]
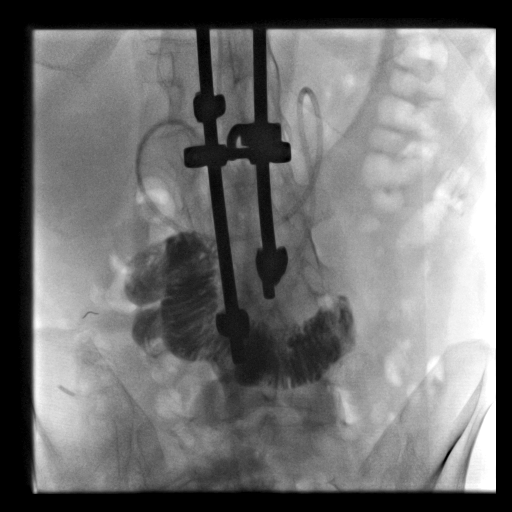

[Series 300: tube placements · 3 of 6 slices shown]
[im 2/6]
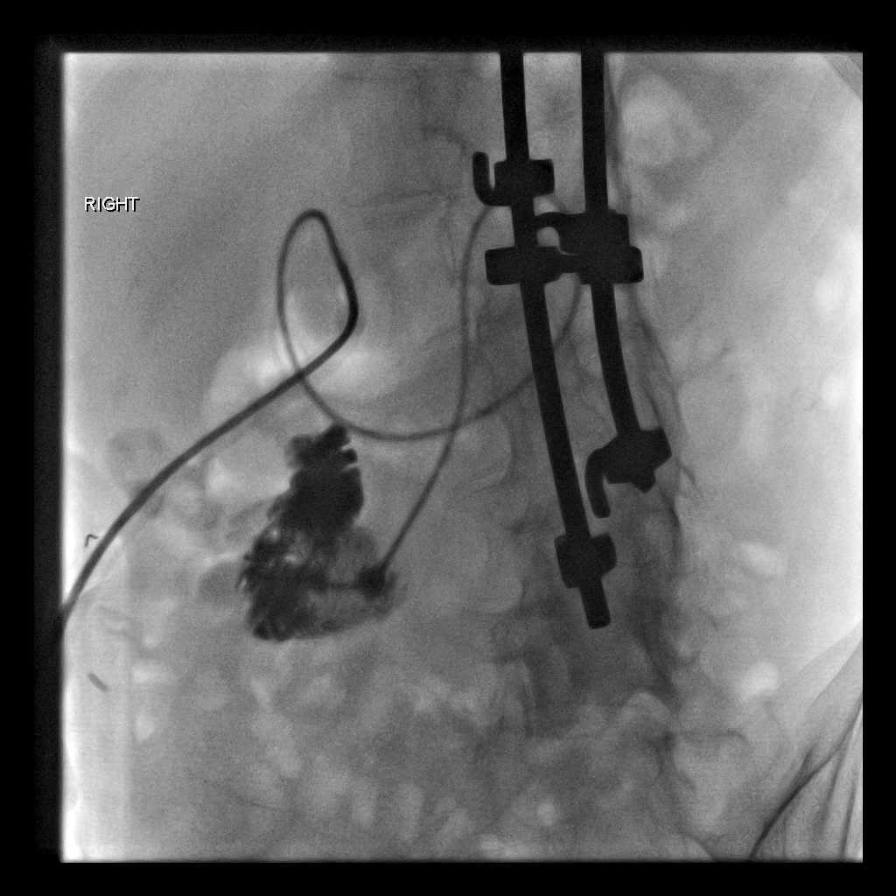
[im 4/6]
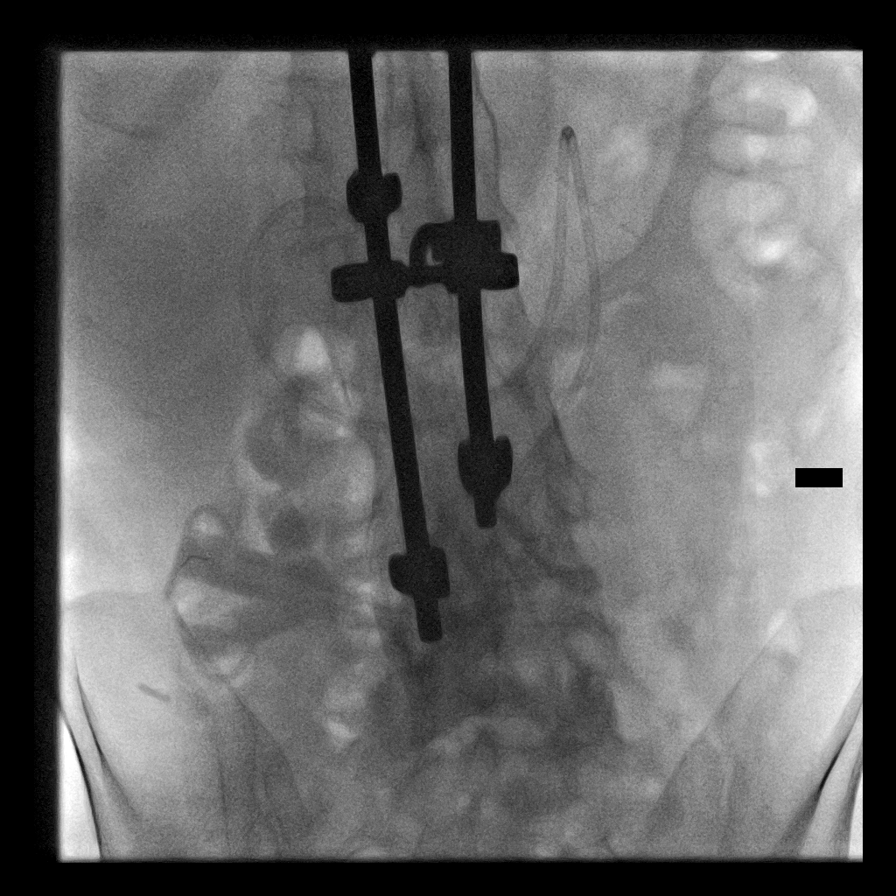
[im 6/6]
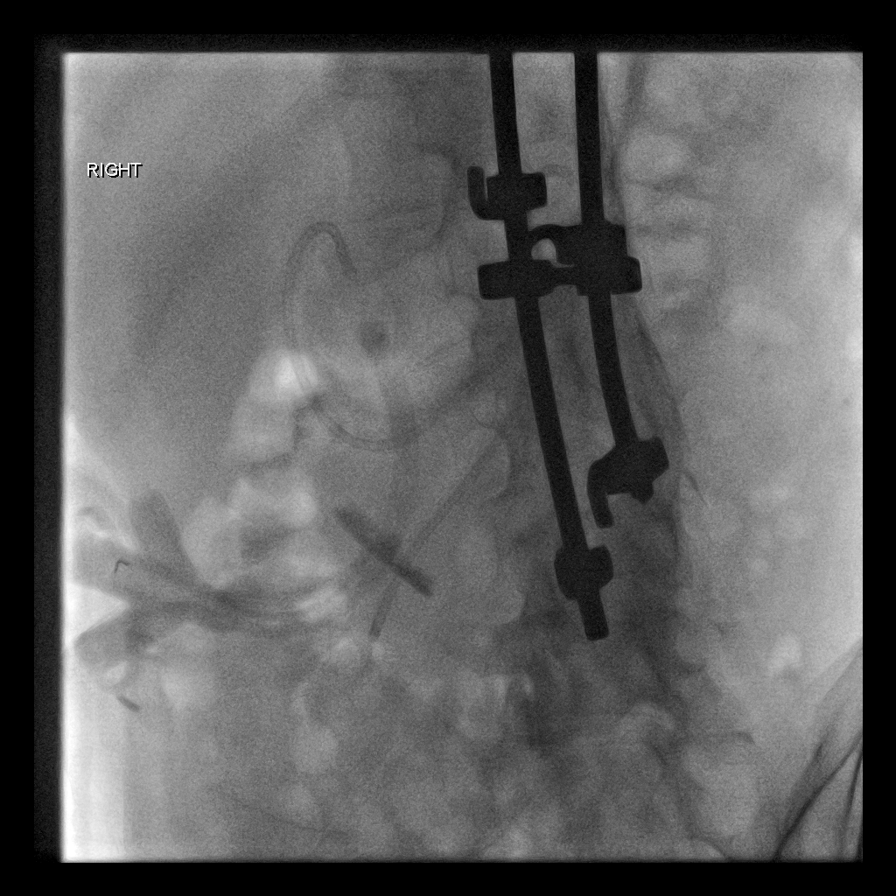

[12 of 23 positions shown; findings below may reference images not displayed]

EXAM:
GI TUBE INJECTION

MEDICATIONS:
None

ANESTHESIA/SEDATION:
None

CONTRAST:  10 mL Omnipaque 300-administered into the gastric lumen.

FLUOROSCOPY TIME:  Fluoroscopy Time: 1 minutes 0 seconds (5 mGy).

COMPLICATIONS:
None immediate.

PROCEDURE:
Informed written consent was obtained from the patient after a
thorough discussion of the procedural risks, benefits and
alternatives. All questions were addressed. A timeout was performed
prior to the initiation of the procedure.

The tube was evaluated fluoroscopically. The gastro jejunostomy tube
appears to be in good position. A gentle hand injection of contrast
material was performed through the tube. The tip of the tube is in
the horizontal duodenum. No evidence of kinking, extravasation or
other complicating feature.
IMPRESSION: Normally functioning gastro jejunostomy tube with the tip of the J
arm in the transverse duodenum.

## 2020-05-09 ENCOUNTER — Other Ambulatory Visit (HOSPITAL_COMMUNITY): Payer: Self-pay | Admitting: Interventional Radiology

## 2020-05-09 DIAGNOSIS — R633 Feeding difficulties, unspecified: Secondary | ICD-10-CM

## 2020-05-10 ENCOUNTER — Telehealth: Payer: Self-pay | Admitting: Family Medicine

## 2020-05-10 ENCOUNTER — Other Ambulatory Visit (HOSPITAL_COMMUNITY): Payer: Medicare Other

## 2020-05-10 NOTE — Telephone Encounter (Signed)
  Per patient's mother, patient is beginning to get a pressure sore. patient mother is requesting a gel pack.  Please Advise

## 2020-05-10 NOTE — Telephone Encounter (Signed)
We can write them for a gel pack but figure out if she has a certain one she has used before and where she wants to get her supply from.

## 2020-05-11 ENCOUNTER — Other Ambulatory Visit: Payer: Self-pay

## 2020-05-11 ENCOUNTER — Ambulatory Visit (HOSPITAL_BASED_OUTPATIENT_CLINIC_OR_DEPARTMENT_OTHER)
Admission: RE | Admit: 2020-05-11 | Discharge: 2020-05-11 | Disposition: A | Discharge status: Home / Self Care | Payer: Medicare Other | Source: Ambulatory Visit | Attending: Family Medicine | Admitting: Family Medicine

## 2020-05-11 DIAGNOSIS — J189 Pneumonia, unspecified organism: Secondary | ICD-10-CM | POA: Diagnosis present

## 2020-05-11 DIAGNOSIS — R05 Cough: Secondary | ICD-10-CM | POA: Diagnosis present

## 2020-05-11 DIAGNOSIS — R059 Cough, unspecified: Secondary | ICD-10-CM

## 2020-05-12 ENCOUNTER — Encounter: Payer: Self-pay | Admitting: Family Medicine

## 2020-05-13 ENCOUNTER — Ambulatory Visit (HOSPITAL_COMMUNITY)
Admission: RE | Admit: 2020-05-13 | Discharge: 2020-05-13 | Disposition: A | Discharge status: Home / Self Care | Payer: Medicare Other | Source: Ambulatory Visit | Attending: Interventional Radiology | Admitting: Interventional Radiology

## 2020-05-13 ENCOUNTER — Other Ambulatory Visit: Payer: Self-pay

## 2020-05-13 ENCOUNTER — Other Ambulatory Visit: Payer: Self-pay | Admitting: Family Medicine

## 2020-05-13 DIAGNOSIS — R633 Feeding difficulties, unspecified: Secondary | ICD-10-CM

## 2020-05-13 DIAGNOSIS — K9413 Enterostomy malfunction: Secondary | ICD-10-CM | POA: Diagnosis not present

## 2020-05-13 DIAGNOSIS — Z4659 Encounter for fitting and adjustment of other gastrointestinal appliance and device: Secondary | ICD-10-CM | POA: Insufficient documentation

## 2020-05-13 DIAGNOSIS — K9423 Gastrostomy malfunction: Secondary | ICD-10-CM | POA: Diagnosis not present

## 2020-05-13 HISTORY — PX: IR GJ TUBE CHANGE: IMG1440

## 2020-05-13 MED ORDER — IOHEXOL 300 MG/ML  SOLN
50.0000 mL | Freq: Once | INTRAMUSCULAR | Status: AC | PRN
  Administered 2020-05-13: 10 mL

## 2020-05-13 MED ORDER — LIDOCAINE VISCOUS HCL 2 % MT SOLN
OROMUCOSAL | Status: AC
  Filled 2020-05-13: qty 15

## 2020-05-13 MED ORDER — LEVOFLOXACIN 500 MG PO TABS: 500.0000 mg | ORAL_TABLET | Freq: Every day | ORAL | 0 refills | Status: DC

## 2020-05-13 NOTE — Telephone Encounter (Signed)
Left message on machine to call back with info

## 2020-05-14 ENCOUNTER — Telehealth: Payer: Self-pay | Admitting: Internal Medicine

## 2020-05-14 NOTE — Telephone Encounter (Signed)
Spoke with patient's mom [name]) regarding recommendations. Mrs. [name] verbalized understanding to report any concerns of ongoing bleeding to Dr. Geroge Baseman or Dr. Hilarie Fredrickson, as well as Carafate q 6 hours for the next 3-5 days and Zegerid twice daily for about 2 weeks, she stated that in the past patient was unable to take PPI while being treated for pneumonia with a certain antibiotic. Pt is currently on Levaquin, Mrs. [name] will find out if there are any contraindications for the two medications if not she will restart Zegerid for patient. Advised to call office with any other concerns or questions.

## 2020-05-14 NOTE — Telephone Encounter (Signed)
This is likely trauma from having his feeding tube exchanged/replaced Would monitor for ongoing bleeding, if this continues I would have her let Dr. Laurence Ferrari with IR or me know Would do the liquid Carafate or Carafate slurry 1 g about every 6 hours for the next 3 to 5 days Would also resume PPI, in the past he is used Zegerid and would do so twice daily for about 2 weeks Let me know if she has additional questions Thank you

## 2020-05-14 NOTE — Telephone Encounter (Signed)
Patient's mom reports bloody discharge running from g-j tube,tube was replaced yesterday and they were told that everything went well, but patient denied that, vomiting with blood today and yesterday. No coffee ground texture, pretty dark, gauze pad was saturated. Changed gauze pad about 2-3 times after getting home from yesterday, 2-3 times today , not sure how many times the aide changed gauze last night. Reports gas and hiccups all through the night, pt also has been recently diagnosed with pneumonia. Urine is darker, mom is going to try and hydrate patient with Gatorade and water if tolerable. Mom has not given patient any meds today except Carafate. Please advise on any further recommendations.

## 2020-05-14 NOTE — Telephone Encounter (Signed)
Pt's mother is requesting a call back from a nurse due to pt vomiting blood.

## 2020-05-15 ENCOUNTER — Other Ambulatory Visit: Payer: Self-pay | Admitting: *Deleted

## 2020-05-15 DIAGNOSIS — R059 Cough, unspecified: Secondary | ICD-10-CM

## 2020-05-15 DIAGNOSIS — J189 Pneumonia, unspecified organism: Secondary | ICD-10-CM

## 2020-05-28 ENCOUNTER — Ambulatory Visit (INDEPENDENT_AMBULATORY_CARE_PROVIDER_SITE_OTHER): Payer: Medicare Other | Admitting: Family Medicine

## 2020-05-28 ENCOUNTER — Other Ambulatory Visit: Payer: Self-pay

## 2020-05-28 VITALS — BP 107/69 | HR 86 | Temp 98.0°F | Resp 12 | Wt 92.2 lb

## 2020-05-28 DIAGNOSIS — G8250 Quadriplegia, unspecified: Secondary | ICD-10-CM | POA: Diagnosis not present

## 2020-05-28 DIAGNOSIS — J189 Pneumonia, unspecified organism: Secondary | ICD-10-CM

## 2020-05-28 DIAGNOSIS — E43 Unspecified severe protein-calorie malnutrition: Secondary | ICD-10-CM

## 2020-05-28 LAB — CBC WITH DIFFERENTIAL/PLATELET
Basophils Absolute: 0 10*3/uL (ref 0.0–0.1)
Basophils Relative: 0.6 % (ref 0.0–3.0)
Eosinophils Absolute: 0.1 10*3/uL (ref 0.0–0.7)
Eosinophils Relative: 1.3 % (ref 0.0–5.0)
HCT: 43.7 % (ref 39.0–52.0)
Hemoglobin: 14.8 g/dL (ref 13.0–17.0)
Lymphocytes Relative: 33.9 % (ref 12.0–46.0)
Lymphs Abs: 1.5 10*3/uL (ref 0.7–4.0)
MCHC: 33.8 g/dL (ref 30.0–36.0)
MCV: 94.7 fl (ref 78.0–100.0)
Monocytes Absolute: 0.3 10*3/uL (ref 0.1–1.0)
Monocytes Relative: 7.5 % (ref 3.0–12.0)
Neutro Abs: 2.5 10*3/uL (ref 1.4–7.7)
Neutrophils Relative %: 56.7 % (ref 43.0–77.0)
Platelets: 171 10*3/uL (ref 150.0–400.0)
RBC: 4.61 Mil/uL (ref 4.22–5.81)
RDW: 12.9 % (ref 11.5–15.5)
WBC: 4.5 10*3/uL (ref 4.0–10.5)

## 2020-05-28 LAB — COMPREHENSIVE METABOLIC PANEL
ALT: 19 U/L (ref 0–53)
AST: 20 U/L (ref 0–37)
Albumin: 4.2 g/dL (ref 3.5–5.2)
Alkaline Phosphatase: 70 U/L (ref 39–117)
BUN: 9 mg/dL (ref 6–23)
CO2: 26 mEq/L (ref 19–32)
Calcium: 9 mg/dL (ref 8.4–10.5)
Chloride: 103 mEq/L (ref 96–112)
Creatinine, Ser: 0.3 mg/dL — ABNORMAL LOW (ref 0.40–1.50)
GFR: 338.86 mL/min (ref >60.00)
Glucose, Bld: 83 mg/dL (ref 70–99)
Potassium: 4.1 mEq/L (ref 3.5–5.1)
Sodium: 137 mEq/L (ref 135–145)
Total Bilirubin: 0.4 mg/dL (ref 0.2–1.2)
Total Protein: 6.9 g/dL (ref 6.0–8.3)

## 2020-05-28 MED ORDER — CIPROFLOXACIN HCL 250 MG PO TABS: ORAL_TABLET | ORAL | 0 refills | Status: DC

## 2020-05-28 NOTE — Patient Instructions (Signed)
Aspiration Pneumonia Aspiration pneumonia is an infection in the lungs. It occurs when saliva or liquid contaminated with bacteria is inhaled (aspirated) into the lungs. When these things get into the lungs, swelling (inflammation) and infection can occur. This can make it difficult to breathe. Aspiration pneumonia is a serious condition and can be life threatening. What are the causes? This condition is caused when saliva or liquid from the mouth, throat, or stomach is inhaled into the lungs, and when those fluids are contaminated with bacteria. What increases the risk? The following factors may make you more likely to develop this condition:  A narrowing of the tube that carries food to the stomach (esophageal narrowing).  Having gastroesophageal reflux disease (GERD).  Having a weak immune system.  Having diabetes.  Having poor oral hygiene.  Being malnourished. The condition is more likely to occur when a person's cough (gag) reflex, or ability to swallow, has decreased. Some things that can cause this decrease include:  Having a brain injury or disease, such as stroke, seizures, Parkinson disease, dementia, or amyotrophic lateral sclerosis (ALS).  Being given a general anesthetic for procedures.  Drinking too much alcohol. If a person passes out and vomits, vomit can be inhaled into the lungs.  Taking certain medicines, such as tranquilizers or sedatives. What are the signs or symptoms? Symptoms of this condition include:  Fever.  A cough with secretions that are yellow, tan, or green.  Breathing problems, such as wheezing or shortness of breath.  Chest pain.  Being more tired than usual (fatigue).  Having a history of coughing while eating or drinking.  Bad breath.  Bluish color to the lips, skin, or fingers. How is this diagnosed? This condition may be diagnosed based on:  A physical exam.  Tests, such as: ? Chest X-ray. ? Sputum culture. Saliva and mucus  (sputum) are collected from the lungs or the tubes that carry air to the lungs (bronchi). The sputum is then tested for bacteria. ? Oximetry. A sensor or clip is placed on areas such as a finger, earlobe, or toe to measure the oxygen level in your blood. ? Blood tests. ? Swallowing study. This test looks at how food is swallowed and whether it goes into your breathing tube (trachea) or esophagus. ? Bronchoscopy. This test uses a flexible tube (bronchoscope) to see inside the lungs. How is this treated? This condition may be treated with:  Medicines. Antibiotic medicine will be given to kill the pneumonia bacteria. Other medicines may also be used to reduce fever or pain.  Breathing assistance and oxygen therapy. Depending on how well you are breathing, you may need to be given oxygen, or you may need breathing support from a breathing machine (ventilator).  Thoracentesis. This is a procedure to remove fluid that has built up in the space between the linings of the chest wall and the lungs.  Feeding tube and diet change. For people who have difficulty swallowing, a feeding tube might be placed in the stomach, or they may be asked to avoid certain food textures or liquids when eating. Follow these instructions at home: Medicines  Take over-the-counter and prescription medicines only as told by your health care provider. ? If you were prescribed an antibiotic medicine, take it as told by your health care provider. Do not stop taking the antibiotic even if you start to feel better. ? Take cough medicine only if you are losing sleep. Cough medicine can prevent your body's natural ability to remove mucus   from your lungs. General instructions  Carefully follow any eating instructions you were given, such as avoiding certain food textures or thickening your liquids. Thickening liquids reduces the risk of developing aspiration pneumonia again.  Use breathing exercises such as postural drainage, deep  breathing, and incentive spirometry to help expel secretions.  Rest as instructed by your health care provider.  Sleep in a semi-upright position at night. Try to sleep in a reclining chair, or place a few pillows under your head.  Do not use any products that contain nicotine or tobacco, such as cigarettes and e-cigarettes. If you need help quitting, ask your health care provider.  Keep all follow-up visits as told by your health care provider. This is important. Contact a health care provider if:  You have a fever.  You have a worsening cough with yellow, tan, or green secretions.  You have coughing while eating or drinking. Get help right away if:  You have worsening shortness of breath, wheezing, or difficulty breathing.  You have chest pain. Summary  Aspiration pneumonia is an infection in the lungs. It is caused when saliva or liquid from the mouth, throat, or stomach is inhaled into the lungs.  Aspiration pneumonia is more likely to occur when a person's cough reflex or ability to swallow has decreased.  Symptoms of aspiration pneumonia include coughing, breathing problems, fever, and chest pain.  Aspiration pneumonia may be treated with antibiotic medicine, other medicines to reduce pain or fever, and breathing assistance or oxygen therapy. This information is not intended to replace advice given to you by your health care provider. Make sure you discuss any questions you have with your health care provider. Document Revised: 10/01/2017 Document Reviewed: 11/24/2016 Elsevier Patient Education  2020 Elsevier Inc.  

## 2020-05-28 NOTE — Progress Notes (Signed)
ur

## 2020-05-29 NOTE — Progress Notes (Signed)
Patient ID: [name], male   DOB: 1986-01-09, 34 y.o.   MRN: [id number]   Subjective:    Patient ID: [name], male    DOB: 08-25-1986, 34 y.o.   MRN: [id number]  Chief Complaint  Patient presents with  . Follow-up    HPI Patient is in today for follow up on chronic medical concerns. No recent hospitalizatons. He has been treated with antibiotics for recurrent pneumonia the past couple of months. He has gotten better each time but when the antibiotics are complete the congestion increases again and he has more trouble managing his feeds. Denies CP/palp/SOB/HA/fevers/GI or GU c/o. Taking meds as prescribed  Past Medical History:  Diagnosis Date  . Cerebral palsy ([place])   . Dehydration 11/22/2013  . Depression with anxiety 08/01/2010   Qualifier: Diagnosis of  By: Nelson-Smith CMA (AAMA), Dottie    . Dyslipidemia 08/19/2017  . Esophagitis 2011  . Gastrostomy in place [facility]) 08/31/2013  . GERD (gastroesophageal reflux disease)   . Hyperlipidemia, mild 08/25/2015  . Hyperthyroidism   . Incontinence of feces   . Loss of weight 08/28/2014  . Medicare annual wellness visit, subsequent 08/25/2015  . Mildly underweight adult 03/16/2017  . Palpitations   . Skin lesion of right ear 03/16/2017  . Thyroid disease 08/01/2010   Qualifier: Diagnosis of  By: Harlon Ditty CMA (AAMA), Dottie      Past Surgical History:  Procedure Laterality Date  . baclofen trial    . baslofen pump implant    . ears tubes    . EYE SURGERY    . FLEXIBLE SIGMOIDOSCOPY N/A 09/07/2014   Procedure: FLEXIBLE SIGMOIDOSCOPY;  Surgeon: [name], MD;  Location: [facility] ENDOSCOPY;  Service: Endoscopy;  Laterality: N/A;  . g-tube insert  August 2006  . hamstring released     to treat contractures.   Marland Kitchen HIP SURGERY     x2 , side   . IR CM INJ ANY COLONIC TUBE W/FLUORO  05/28/2017  . IR CM INJ ANY COLONIC TUBE W/FLUORO  07/07/2019  . IR CM INJ ANY COLONIC TUBE W/FLUORO  09/18/2019  . IR GASTR TUBE CONVERT GASTR-JEJ  PER W/FL MOD SED  03/14/2019  . IR GENERIC HISTORICAL  07/01/2016   IR GASTR TUBE CONVERT GASTR-JEJ PER W/FL MOD SED 07/01/2016 [name], MD [facility]-INTERV RAD  . IR GENERIC HISTORICAL  07/08/2016   IR PATIENT EVAL TECH 0-60 MINS 07/08/2016 [name], MD [facility]  . IR GENERIC HISTORICAL  07/14/2016   IR GJ TUBE CHANGE 07/14/2016 [name], MD [facility]  . IR GENERIC HISTORICAL  07/21/2016   IR PATIENT EVAL TECH 0-60 MINS [facility]  . IR GENERIC HISTORICAL  08/31/2016   IR [place] DUODEN/JEJUNO TUBE PERCUT W/FLUORO 08/31/2016 [name], MD [facility]  . IR GENERIC HISTORICAL  09/03/2016   IR GJ TUBE CHANGE 09/03/2016 [name], MD [facility]  . IR GENERIC HISTORICAL  09/10/2016   IR GASTR TUBE CONVERT GASTR-JEJ PER W/FL MOD SED 09/10/2016 [facility]  . IR GENERIC HISTORICAL  09/16/2016   IR PATIENT EVAL TECH 0-60 MINS [facility]  . IR GENERIC HISTORICAL  09/29/2016   IR GJ TUBE CHANGE 09/29/2016 [name], MD [facility]  . IR GJ TUBE CHANGE  02/05/2017  . IR GJ TUBE CHANGE  05/21/2017  . IR GJ TUBE CHANGE  08/25/2017  . IR GJ TUBE CHANGE  01/18/2018  . IR GJ TUBE CHANGE  02/04/2018  . IR GJ TUBE  CHANGE  04/21/2018  . IR GJ TUBE CHANGE  08/18/2018  . IR GJ TUBE CHANGE  09/12/2019  . IR GJ TUBE CHANGE  01/03/2020  . IR GJ TUBE CHANGE  05/13/2020  . IR MECH REMOV OBSTRUC MAT ANY COLON TUBE W/FLUORO  09/02/2018  . IR REPLC GASTRO/COLONIC TUBE PERCUT W/FLUORO  10/17/2018  . PEG PLACEMENT  10/21/2011   Procedure: PERCUTANEOUS ENDOSCOPIC GASTROSTOMY (PEG) REPLACEMENT;  Surgeon: [name], MD;  Location: [facility] ENDOSCOPY;  Service: Endoscopy;  Laterality: N/A;  . PEG PLACEMENT N/A 06/13/2013   Procedure: PERCUTANEOUS ENDOSCOPIC GASTROSTOMY (PEG) REPLACEMENT;  Surgeon: [name], MD;  Location: [facility] ENDOSCOPY;  Service: Endoscopy;  Laterality: N/A;  . SPINAL FUSION    . spinal fusion to correct 70 degree kyphosis  11-2010  . spinal fusioncorrect 106 degree kyphosis    .  SPINE SURGERY  ,11/20/2010, 2011   for correction of severe contracturing spinal kyphosis.   . TONSILLECTOMY      Family History  Problem Relation Age of Onset  . Asthma Mother   . Hyperlipidemia Mother   . COPD Mother   . Other Mother        bronchial stasis/ABPA  . Cancer Maternal Grandmother 34       breast  . Hyperlipidemia Maternal Grandmother   . Hypertension Maternal Grandmother   . Cancer Maternal Grandfather        prostate  . Heart disease Paternal Grandfather        CHF  . Osteoporosis Paternal Grandmother   . Arthritis Paternal Grandmother        rheumatoid    Social History   Socioeconomic History  . Marital status: Single    Spouse name: Not on file  . Number of children: 0  . Years of education: Not on file  . Highest education level: Not on file  Occupational History  . Occupation: disbaled  Tobacco Use  . Smoking status: Never Smoker  . Smokeless tobacco: Never Used  Substance and Sexual Activity  . Alcohol use: No  . Drug use: No  . Sexual activity: Never  Other Topics Concern  . Not on file  Social History Narrative  . Not on file   Social Determinants of Health   Financial Resource Strain:   . Difficulty of Paying Living Expenses:   Food Insecurity:   . Worried About Charity fundraiser in the Last Year:   . Arboriculturist in the Last Year:   Transportation Needs:   . Film/video editor (Medical):   Marland Kitchen Lack of Transportation (Non-Medical):   Physical Activity:   . Days of Exercise per Week:   . Minutes of Exercise per Session:   Stress:   . Feeling of Stress :   Social Connections:   . Frequency of Communication with Friends and Family:   . Frequency of Social Gatherings with Friends and Family:   . Attends Religious Services:   . Active Member of Clubs or Organizations:   . Attends Archivist Meetings:   Marland Kitchen Marital Status:   Intimate Partner Violence:   . Fear of Current or Ex-Partner:   . Emotionally Abused:   Marland Kitchen  Physically Abused:   . Sexually Abused:     Outpatient Medications Prior to Visit  Medication Sig Dispense Refill  . AMBULATORY NON FORMULARY MEDICATION Medication Name: MIC gastrostomy/bolus feeding tube 24 French Part number 0110-24. #2 and 10 cc lurer lock syringe #2 Dx: 4 Device  2  . bacitracin 500 UNIT/GM ointment Apply 1 application topically 2 (two) times daily. (Patient taking differently: Apply 1 application topically as needed. ) 30 g 1  . clotrimazole-betamethasone (LOTRISONE) cream Apply 1 application topically 2 (two) times daily. (Patient taking differently: Apply 1 application topically as needed. ) 45 g 1  . dantrolene (DANTRIUM) 50 MG capsule Take 1 capsule (50 mg total) by mouth 3 (three) times daily. 90 capsule 6  . divalproex (DEPAKOTE SPRINKLE) 125 MG capsule Take 2 capsules in morning, 5 capsules at bedtime (Patient taking differently: 250-625 mg. Take 2 capsules in morning, 5 capsules at bedtime) 210 capsule 4  . Incontinence Supply Disposable (PREVAIL BREEZERS MEDIUM) MISC pkg of 16- size medium 32" to 44"  Breathable cloth-like outer fabric (can't use the plastic outer surgace  Item # [id number] 16 each 6  . LORazepam (ATIVAN) 1 MG tablet Take one tablet in evenings as needed for insomnia. (Patient taking differently: 1.5 mg. Take and one half tablet in evenings for insomnia.) 30 tablet 1  . Misc. Devices (ALL-BODY MASSAGE) MISC 1 Units/hr by Does not apply route as needed. Full body massage for Muscle spasticity due to Cerebral palsy 99 each 99  . mupirocin ointment (BACTROBAN) 2 % Apply to area thin film twice daily if needed (Patient taking differently: as needed. Apply to area thin film twice daily if needed) 22 g 0  . NON FORMULARY Bard Leg Bag Extension tubing w/Connector 18", Sterile, latex-free  Item# [id number]    . NON FORMULARY Colorplast Freedom Cath Latex Self-Adhering Male External Catheter 35mm Diameter Intermediate  Item# [id number]    . NONFORMULARY OR  COMPOUNDED ITEM Covidien REF [id number] - Kangaroo Joey Pump Set with Flush Bags - 1000 mL 1 each 0  . Nutritional Supplements (FEEDING SUPPLEMENT, KATE FARMS STANDARD 1.4,) LIQD liquid Take 325 mLs by mouth as directed. 3 carton over 16 hrs    . OLANZapine (ZYPREXA) 2.5 MG tablet Place 5 mg into feeding tube 2 (two) times daily. 2.5 mg in the am and 5 mg BID  3  . Omeprazole-Sodium Bicarbonate (ZEGERID) 20-1100 MG CAPS capsule Take 1 capsule by mouth daily before breakfast.    . Ostomy Supplies (PROTECTIVE BARRIER WIPES) MISC 1-1/4" X 3"  Item #[id number] 75 each 6  . PARoxetine (PAXIL) 10 MG tablet Take 15 mg by mouth daily.    . sucralfate (CARAFATE) 1 g tablet TAKE 1 TABLET BY MOUTH 4 TIMES DAILY - WITH MEALS AND AT BEDTIME. (Patient taking differently: as needed. ) 120 tablet 1  . doxycycline (VIBRA-TABS) 100 MG tablet Crush 1 tablet via peg tube twice a day for 10 days 20 tablet 0  . levofloxacin (LEVAQUIN) 500 MG tablet Take 1 tablet (500 mg total) by mouth daily. 7 tablet 0   No facility-administered medications prior to visit.    Allergies  Allergen Reactions  . Ambien [Zolpidem Tartrate] Nausea Only  . Antihistamines, Chlorpheniramine-Type     Other reaction(s): Other (See Comments) Other Reaction: agitation  . Augmentin [Amoxicillin-Pot Clavulanate] Diarrhea  . Codeine Other (See Comments)    Makes patient too active after a few days.  . Metoclopramide Other (See Comments)    Delusion, emotionality   . Baclofen Anxiety  . Pheniramine Rash    Other reaction(s): Other (See Comments) Other Reaction: agitation  . Sulfa Antibiotics Rash  . Sulfonamide Derivatives Rash    Review of Systems  Constitutional: Positive for malaise/fatigue. Negative for fever.  HENT: Positive for congestion.  Eyes: Negative for blurred vision.  Respiratory: Negative for shortness of breath.   Cardiovascular: Negative for chest pain, palpitations and leg swelling.  Gastrointestinal: Negative for  abdominal pain, blood in stool and nausea.  Genitourinary: Negative for dysuria and frequency.  Musculoskeletal: Negative for falls.  Skin: Negative for rash.  Neurological: Negative for dizziness, loss of consciousness and headaches.  Endo/Heme/Allergies: Negative for environmental allergies.  Psychiatric/Behavioral: Negative for depression. The patient is not nervous/anxious.        Objective:    Physical Exam Vitals and nursing note reviewed.  Constitutional:      General: He is not in acute distress.    Appearance: He is well-developed.  HENT:     Head: Normocephalic and atraumatic.     Nose: Nose normal.  Eyes:     General:        Right eye: No discharge.        Left eye: No discharge.  Cardiovascular:     Rate and Rhythm: Normal rate and regular rhythm.     Heart sounds: No murmur heard.   Pulmonary:     Effort: Pulmonary effort is normal.     Breath sounds: Normal breath sounds.  Abdominal:     General: Bowel sounds are normal.     Palpations: Abdomen is soft.     Tenderness: There is no abdominal tenderness.  Musculoskeletal:     Cervical back: Normal range of motion and neck supple.     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   Skin:    General: Skin is warm and dry.  Neurological:     Mental Status: He is alert and oriented to person, place, and time.     BP 107/69 (BP Location: Left Arm, Cuff Size: Normal)   Pulse 86   Temp 98 F (36.7 C) (Temporal)   Resp 12   Wt (!) 92 lb 3.2 oz (41.8 kg)   SpO2 98%   BMI 18.01 kg/m  Wt Readings from Last 3 Encounters:  05/28/20 (!) 92 lb 3.2 oz (41.8 kg)  03/28/20 92 lb (41.7 kg)  08/30/19 105 lb (47.6 kg)    Diabetic Foot Exam - Simple   No data filed     Lab Results  Component Value Date   WBC 4.5 05/28/2020   HGB 14.8 05/28/2020   HCT 43.7 05/28/2020   PLT 171.0 05/28/2020   GLUCOSE 83 05/28/2020   CHOL 136 02/13/2020   TRIG 76.0 02/13/2020   HDL 31.80 (L) 02/13/2020   LDLCALC 89  02/13/2020   ALT 19 05/28/2020   AST 20 05/28/2020   NA 137 05/28/2020   K 4.1 05/28/2020   CL 103 05/28/2020   CREATININE 0.30 (L) 05/28/2020   BUN 9 05/28/2020   CO2 26 05/28/2020   TSH 1.61 02/13/2020   HGBA1C 5.0 04/29/2007    Lab Results  Component Value Date   TSH 1.61 02/13/2020   Lab Results  Component Value Date   WBC 4.5 05/28/2020   HGB 14.8 05/28/2020   HCT 43.7 05/28/2020   MCV 94.7 05/28/2020   PLT 171.0 05/28/2020   Lab Results  Component Value Date   NA 137 05/28/2020   K 4.1 05/28/2020   CO2 26 05/28/2020   GLUCOSE 83 05/28/2020   BUN 9 05/28/2020   CREATININE 0.30 (L) 05/28/2020   BILITOT 0.4 05/28/2020   ALKPHOS 70 05/28/2020   AST 20 05/28/2020   ALT 19 05/28/2020   PROT 6.9 05/28/2020  ALBUMIN 4.2 05/28/2020   CALCIUM 9.0 05/28/2020   ANIONGAP 11 09/18/2014   GFR 338.86 05/28/2020   Lab Results  Component Value Date   CHOL 136 02/13/2020   Lab Results  Component Value Date   HDL 31.80 (L) 02/13/2020   Lab Results  Component Value Date   LDLCALC 89 02/13/2020   Lab Results  Component Value Date   TRIG 76.0 02/13/2020   Lab Results  Component Value Date   CHOLHDL 4 02/13/2020   Lab Results  Component Value Date   HGBA1C 5.0 04/29/2007       Assessment & Plan:   Problem List Items Addressed This Visit    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 He is given a course of Ciprofloxacin in case he worsens again.       Relevant Medications   ciprofloxacin (CIPRO) 250 MG tablet   Other Relevant Orders   CBC w/Diff (Completed)   Comprehensive metabolic panel (Completed)   QuantiFERON-TB Gold Plus      I have discontinued Lajuana Matte "[name]"'s doxycycline and levofloxacin. I am also having him start on ciprofloxacin. Additionally, I am having him maintain his divalproex, Omeprazole-Sodium Bicarbonate, NON FORMULARY, NON FORMULARY, Protective Barrier Wipes, Prevail Breezers Medium, bacitracin, NONFORMULARY OR COMPOUNDED ITEM, LORazepam, PARoxetine, AMBULATORY NON FORMULARY MEDICATION, OLANZapine, mupirocin ointment, All-Body Massage, sucralfate, clotrimazole-betamethasone, dantrolene, and feeding supplement (KATE FARMS STANDARD 1.4).  Meds ordered this encounter  Medications  . ciprofloxacin (CIPRO) 250 MG tablet    Sig: 1 tab via peg tube twice a day    Dispense:  20 tablet    Refill:  0    Only fill if patient requests     [name], MD

## 2020-05-29 NOTE — Assessment & Plan Note (Signed)
He has improved on antibiotics. He is given a course of Ciprofloxacin in case he worsens again.

## 2020-05-29 NOTE — Assessment & Plan Note (Signed)
They have changed his formula and they are hoping he will put weight back on

## 2020-05-29 NOTE — Assessment & Plan Note (Signed)
He needs forms completed for a day treatment program labs ordered including a Quantiferon for his camp paperwork

## 2020-05-31 LAB — QUANTIFERON-TB GOLD PLUS
Mitogen-NIL: 3.19 IU/mL
NIL: 7.52 IU/mL
QuantiFERON-TB Gold Plus: NEGATIVE
TB1-NIL: <0 IU/mL
TB2-NIL: <0 IU/mL

## 2020-06-04 ENCOUNTER — Telehealth: Payer: Self-pay | Admitting: *Deleted

## 2020-06-04 NOTE — Telephone Encounter (Signed)
Left message on machine that form is ready for pickup.  Form placed at front for pickup.

## 2020-06-05 ENCOUNTER — Encounter: Payer: Medicare Other | Attending: Physical Medicine & Rehabilitation | Admitting: Physical Medicine & Rehabilitation

## 2020-06-05 ENCOUNTER — Encounter: Payer: Self-pay | Admitting: Physical Medicine & Rehabilitation

## 2020-06-05 ENCOUNTER — Other Ambulatory Visit: Payer: Self-pay

## 2020-06-05 VITALS — BP 110/73 | HR 85 | Temp 96.3°F

## 2020-06-05 DIAGNOSIS — G243 Spasmodic torticollis: Secondary | ICD-10-CM | POA: Diagnosis not present

## 2020-06-05 DIAGNOSIS — G8250 Quadriplegia, unspecified: Secondary | ICD-10-CM | POA: Insufficient documentation

## 2020-06-05 NOTE — Progress Notes (Signed)
HPI: [name] is here in follow-up of his spastic tetraplegia in the setting of his cerebral palsy.  At her last visit we performed Botox injections to his sternocleidomastoid muscle, trapezius and bilateral biceps and brachioradialis muscles.  Him is not convinced that the injections are helping much anymore.  He is not anxious to pursue follow-up injections today.  At the time I saw him for injections he was beginning to have some pulmonary complications and received to have further respiratory infections for the next several weeks after we met.  He seems to be in the clear now.  He is tolerating his tube feeds.  Mom does report that is having increased pain in both hips as well as low back which is noticeable during transfers and when he gets into bed.    ROS: limited by speech, cognitoin          General: No acute distress HEENT: EOMI, oral membranes moist Cards: reg rate  Chest: normal effort Abdomen: Soft, NT, ND Skin: dry, intact Extremities: no edema Skin: Clean and intact without signs of breakdown Neuro:  Patient very alert.  Speech is very dysarthric but intelligible at times.  He has some simple movement in all four limbs but is limited by tone.     SCM 1-2/4,  Trap 1/4. Marland Kitchen Biceps are 1-2/4 bilaterally. Finger and wrist flexors 3/4 with contacture.   Quads 2-3/4 bilaterally.  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 He does sense pain in all 4 limbs.  Musculoskeletal:  Mild pain in the hips with internal and external rotation of either leg.  Hip AB the doctors are fairly loose.  His hip flexor muscles are a bit tight.  Hamstrings with a bit of tone as well.  Tone is around 1 out of 4 in both muscles.  Probably mild flexion contractures of both hips.  Head is rotated about 15 degrees from neutral to the left.  Traps are a bit tight. Psych:  pleasant and alert           Assessment & Plan:  1. 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 Cervical Dystonia 3.  Severe  dysarthria and dysphagia 4.  Bilateral hip pain/low back pain and hip flexor tightness     Plan:  1.  We will hold off on repeat Botox injections today.  Consider follow-up injections to the sternocleidomastoid and trapezius muscles.  I am not anxious to pursue further injections to his upper extremities.   2.  Made referral to [facility] outpatient neuro rehab for range of motion of both hips, modalities, assessment for wheelchair fit and establishment of a new home exercise program..   3. Will see him back in about 3 months. 15 minutes of face to face patient care time were spent during this visit. All questions were encouraged and answered.

## 2020-06-11 ENCOUNTER — Telehealth: Payer: Self-pay | Admitting: Family Medicine

## 2020-06-11 NOTE — Telephone Encounter (Signed)
  Caller : [email or web address]  Call back # [phone]  Amy from [facility] calling about  orders sent for feeding tube cleaning to our office.  Amy  States she has not received signed order back yet.  Please Advise

## 2020-06-12 NOTE — Telephone Encounter (Signed)
Form has been faxed again

## 2020-06-12 NOTE — Telephone Encounter (Signed)
I remember signing orders for him. If we cannot find them they will have to send another set over.

## 2020-06-25 ENCOUNTER — Telehealth: Payer: Self-pay | Admitting: *Deleted

## 2020-06-25 NOTE — Telephone Encounter (Signed)
Left message on machine that form has been mailed.

## 2020-07-17 ENCOUNTER — Telehealth (INDEPENDENT_AMBULATORY_CARE_PROVIDER_SITE_OTHER): Payer: Medicare Other | Admitting: Family Medicine

## 2020-07-17 ENCOUNTER — Other Ambulatory Visit: Payer: Self-pay

## 2020-07-17 DIAGNOSIS — G809 Cerebral palsy, unspecified: Secondary | ICD-10-CM

## 2020-07-17 DIAGNOSIS — R159 Full incontinence of feces: Secondary | ICD-10-CM

## 2020-07-17 DIAGNOSIS — R32 Unspecified urinary incontinence: Secondary | ICD-10-CM

## 2020-07-17 NOTE — Assessment & Plan Note (Signed)
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 Using external catheter with good management and no trouble with skin breakdown or complications.

## 2020-07-17 NOTE — Assessment & Plan Note (Signed)
Doing well with current regimen paperwork filled out to get the patient incontinence supplies needed for the year. Including wipes, external catheters and more.

## 2020-07-17 NOTE — Progress Notes (Signed)
Virtual Visit via Video Note  I connected with TOR TSUDA on 07/17/20 at  9:40 AM EDT by a video enabled telemedicine application and verified that I am speaking with the correct person using two identifiers.  Location: Patient: home, caregiver/mother, patient and provider in visit Provider: home   I discussed the limitations of evaluation and management by telemedicine and the availability of in person appointments. The patient expressed understanding and agreed to proceed. Nani Skillern, CMA was able to get the patient set up on video visit.    Subjective:    Patient ID: [name], male    DOB: 23-Apr-1986, 34 y.o.   MRN: [id number]  Chief Complaint  Patient presents with  . follow up papperwork    HPI Patient is in today for evaluation of his need for incontinence supplies. His mother reports that he continues to need his incontinence supplies given his lifelong spastic quadriplegia. He has had good care with current level of supplies. He is using external catheters and disposable undergarments to manage his urinary incontinence and he is able to manage his fecal incontinence well as well. No skin breakdown or other concerns. He is feeling well. Denies CP/palp/SOB/HA/congestion/fevers. Taking meds as prescribed  Past Medical History:  Diagnosis Date  . Cerebral palsy ([place])   . Dehydration 11/22/2013  . Depression with anxiety 08/01/2010   Qualifier: Diagnosis of  By: Nelson-Smith CMA (AAMA), Dottie    . Dyslipidemia 08/19/2017  . Esophagitis 2011  . Gastrostomy in place [facility] 08/31/2013  . GERD (gastroesophageal reflux disease)   . Hyperlipidemia, mild 08/25/2015  . Hyperthyroidism   . Incontinence of feces   . Loss of weight 08/28/2014  . Medicare annual wellness visit, subsequent 08/25/2015  . Mildly underweight adult 03/16/2017  . Palpitations   . Skin lesion of right ear 03/16/2017  . Thyroid disease 08/01/2010   Qualifier: Diagnosis of  By: Harlon Ditty CMA (AAMA),  Dottie      Past Surgical History:  Procedure Laterality Date  . baclofen trial    . baslofen pump implant    . ears tubes    . EYE SURGERY    . FLEXIBLE SIGMOIDOSCOPY N/A 09/07/2014   Procedure: FLEXIBLE SIGMOIDOSCOPY;  Surgeon: [name], MD;  Location: [facility] ENDOSCOPY;  Service: Endoscopy;  Laterality: N/A;  . g-tube insert  August 2006  . hamstring released     to treat contractures.   Marland Kitchen HIP SURGERY     x2 , side   . IR CM INJ ANY COLONIC TUBE W/FLUORO  05/28/2017  . IR CM INJ ANY COLONIC TUBE W/FLUORO  07/07/2019  . IR CM INJ ANY COLONIC TUBE W/FLUORO  09/18/2019  . IR GASTR TUBE CONVERT GASTR-JEJ PER W/FL MOD SED  03/14/2019  . IR GENERIC HISTORICAL  07/01/2016   IR GASTR TUBE CONVERT GASTR-JEJ PER W/FL MOD SED 07/01/2016 [name], MD [facility]INTER[facility]  . IR GENERIC HISTORICAL  07/08/2016   IR PATIENT EVAL TECH 0-60 MINS 07/08/2016 [name], MD [facility]  . IR GENERIC HISTORICAL  07/14/2016   IR GJ TUBE CHANGE 07/14/2016 [name], MD [facility]  . IR GENERIC HISTORICAL  07/21/2016   IR PATIENT EVAL TECH 0-60 MINS [facility]  . IR GENERIC HISTORICAL  08/31/2016   IR [place] DUODEN/JEJUNO TUBE PERCUT W/FLUORO 08/31/2016 [name], MD [facility]  . IR GENERIC HISTORICAL  09/03/2016   IR GJ TUBE CHANGE 09/03/2016 [name], MD [facility]  . IR GENERIC HISTORICAL  09/10/2016   IR GASTR TUBE CONVERT GASTR-JEJ PER W/FL MOD SED 09/10/2016 [facility]  . IR GENERIC HISTORICAL  09/16/2016   IR PATIENT EVAL TECH 0-60 MINS [facility]  . IR GENERIC HISTORICAL  09/29/2016   IR GJ TUBE CHANGE 09/29/2016 [name], MD [facility]  . IR GJ TUBE CHANGE  02/05/2017  . IR GJ TUBE CHANGE  05/21/2017  . IR GJ TUBE CHANGE  08/25/2017  . IR GJ TUBE CHANGE  01/18/2018  . IR GJ TUBE CHANGE  02/04/2018  . IR GJ TUBE CHANGE  04/21/2018  . IR GJ TUBE CHANGE  08/18/2018  . IR GJ TUBE CHANGE  09/12/2019  . IR GJ TUBE CHANGE  01/03/2020  . IR GJ TUBE CHANGE  05/13/2020  . IR MECH  REMOV OBSTRUC MAT ANY COLON TUBE W/FLUORO  09/02/2018  . IR REPLC GASTRO/COLONIC TUBE PERCUT W/FLUORO  10/17/2018  . PEG PLACEMENT  10/21/2011   Procedure: PERCUTANEOUS ENDOSCOPIC GASTROSTOMY (PEG) REPLACEMENT;  Surgeon: [name], MD;  Location: [facility] ENDOSCOPY;  Service: Endoscopy;  Laterality: N/A;  . PEG PLACEMENT N/A 06/13/2013   Procedure: PERCUTANEOUS ENDOSCOPIC GASTROSTOMY (PEG) REPLACEMENT;  Surgeon: [name], MD;  Location: [facility] ENDOSCOPY;  Service: Endoscopy;  Laterality: N/A;  . SPINAL FUSION    . spinal fusion to correct 70 degree kyphosis  11-2010  . spinal fusioncorrect 106 degree kyphosis    . SPINE SURGERY  ,11/20/2010, 2011   for correction of severe contracturing spinal kyphosis.   . TONSILLECTOMY      Family History  Problem Relation Age of Onset  . Asthma Mother   . Hyperlipidemia Mother   . COPD Mother   . Other Mother        bronchial stasis/ABPA  . Cancer Maternal Grandmother 67       breast  . Hyperlipidemia Maternal Grandmother   . Hypertension Maternal Grandmother   . Cancer Maternal Grandfather        prostate  . Heart disease Paternal Grandfather        CHF  . Osteoporosis Paternal Grandmother   . Arthritis Paternal Grandmother        rheumatoid    Social History   Socioeconomic History  . Marital status: Single    Spouse name: Not on file  . Number of children: 0  . Years of education: Not on file  . Highest education level: Not on file  Occupational History  . Occupation: disbaled  Tobacco Use  . Smoking status: Never Smoker  . Smokeless tobacco: Never Used  Substance and Sexual Activity  . Alcohol use: No  . Drug use: No  . Sexual activity: Never  Other Topics Concern  . Not on file  Social History Narrative  . Not on file   Social Determinants of Health   Financial Resource Strain:   . Difficulty of Paying Living Expenses: Not on file  Food Insecurity:   . Worried About Charity fundraiser in the Last Year: Not on file  .  Ran Out of Food in the Last Year: Not on file  Transportation Needs:   . Lack of Transportation (Medical): Not on file  . Lack of Transportation (Non-Medical): Not on file  Physical Activity:   . Days of Exercise per Week: Not on file  . Minutes of Exercise per Session: Not on file  Stress:   . Feeling of Stress : Not on file  Social Connections:   . Frequency of Communication with  Friends and Family: Not on file  . Frequency of Social Gatherings with Friends and Family: Not on file  . Attends Religious Services: Not on file  . Active Member of Clubs or Organizations: Not on file  . Attends Archivist Meetings: Not on file  . Marital Status: Not on file  Intimate Partner Violence:   . Fear of Current or Ex-Partner: Not on file  . Emotionally Abused: Not on file  . Physically Abused: Not on file  . Sexually Abused: Not on file    Outpatient Medications Prior to Visit  Medication Sig Dispense Refill  . AMBULATORY NON FORMULARY MEDICATION Medication Name: MIC gastrostomy/bolus feeding tube 24 French Part number 0110-24. #2 and 10 cc lurer lock syringe #2 Dx: 4 Device 2  . bacitracin 500 UNIT/GM ointment Apply 1 application topically 2 (two) times daily. (Patient taking differently: Apply 1 application topically as needed. ) 30 g 1  . ciprofloxacin (CIPRO) 250 MG tablet 1 tab via peg tube twice a day 20 tablet 0  . clotrimazole-betamethasone (LOTRISONE) cream Apply 1 application topically 2 (two) times daily. (Patient taking differently: Apply 1 application topically as needed. ) 45 g 1  . dantrolene (DANTRIUM) 50 MG capsule Take 1 capsule (50 mg total) by mouth 3 (three) times daily. 90 capsule 6  . divalproex (DEPAKOTE SPRINKLE) 125 MG capsule Take 2 capsules in morning, 5 capsules at bedtime (Patient taking differently: 250-625 mg. Take 2 capsules in morning, 5 capsules at bedtime) 210 capsule 4  . Incontinence Supply Disposable (PREVAIL BREEZERS MEDIUM) MISC pkg of 16- size  medium 32" to 44"  Breathable cloth-like outer fabric (can't use the plastic outer surgace  Item # [id number] 16 each 6  . LORazepam (ATIVAN) 1 MG tablet Take one tablet in evenings as needed for insomnia. (Patient taking differently: 1.5 mg. Take and one half tablet in evenings for insomnia.) 30 tablet 1  . Misc. Devices (ALL-BODY MASSAGE) MISC 1 Units/hr by Does not apply route as needed. Full body massage for Muscle spasticity due to Cerebral palsy 99 each 99  . mupirocin ointment (BACTROBAN) 2 % Apply to area thin film twice daily if needed (Patient taking differently: as needed. Apply to area thin film twice daily if needed) 22 g 0  . NON FORMULARY Bard Leg Bag Extension tubing w/Connector 18", Sterile, latex-free  Item# [id number]    . NON FORMULARY Colorplast Freedom Cath Latex Self-Adhering Male External Catheter 80mm Diameter Intermediate  Item# [id number]    . NONFORMULARY OR COMPOUNDED ITEM [employer] REF [id number] - [place] Joey Pump Set with Flush Bags - 1000 mL 1 each 0  . Nutritional Supplements (FEEDING SUPPLEMENT, KATE FARMS STANDARD 1.4,) LIQD liquid Take 325 mLs by mouth as directed. 3 carton over 16 hrs    . OLANZapine (ZYPREXA) 2.5 MG tablet Place 5 mg into feeding tube 2 (two) times daily. 2.5 mg in the am and 5 mg BID  3  . Omeprazole-Sodium Bicarbonate (ZEGERID) 20-1100 MG CAPS capsule Take 1 capsule by mouth daily before breakfast.    . Ostomy Supplies (PROTECTIVE BARRIER WIPES) MISC 1-1/4" X 3"  Item #[id number] 75 each 6  . PARoxetine (PAXIL) 10 MG tablet Take 15 mg by mouth daily.    . sucralfate (CARAFATE) 1 g tablet TAKE 1 TABLET BY MOUTH 4 TIMES DAILY - WITH MEALS AND AT BEDTIME. (Patient taking differently: as needed. ) 120 tablet 1   No facility-administered medications prior to visit.    Allergies  Allergen Reactions  . Ambien [Zolpidem Tartrate] Nausea Only  . Antihistamines, Chlorpheniramine-Type     Other reaction(s): Other (See Comments) Other Reaction:  agitation  . Augmentin [Amoxicillin-Pot Clavulanate] Diarrhea  . Codeine Other (See Comments)    Makes patient too active after a few days.  . Metoclopramide Other (See Comments)    Delusion, emotionality   . Baclofen Anxiety  . Pheniramine Rash    Other reaction(s): Other (See Comments) Other Reaction: agitation  . Sulfa Antibiotics Rash  . Sulfonamide Derivatives Rash    Review of Systems  Constitutional: Negative for fever and malaise/fatigue.  HENT: Negative for congestion.   Eyes: Negative for blurred vision.  Respiratory: Negative for shortness of breath.   Cardiovascular: Negative for chest pain, palpitations and leg swelling.  Gastrointestinal: Negative for abdominal pain, blood in stool and nausea.  Genitourinary: Positive for frequency and urgency. Negative for dysuria and hematuria.  Musculoskeletal: Negative for falls.  Skin: Negative for rash.  Neurological: Negative for dizziness, loss of consciousness and headaches.  Endo/Heme/Allergies: Negative for environmental allergies.  Psychiatric/Behavioral: Negative for depression. The patient is not nervous/anxious.        Objective:    Physical Exam unable to perform physical exam due to episode of incontinence and clean up  There were no vitals taken for this visit. Wt Readings from Last 3 Encounters:  05/28/20 (!) 92 lb 3.2 oz (41.8 kg)  03/28/20 92 lb (41.7 kg)  08/30/19 105 lb (47.6 kg)    Diabetic Foot Exam - Simple   No data filed     Lab Results  Component Value Date   WBC 4.5 05/28/2020   HGB 14.8 05/28/2020   HCT 43.7 05/28/2020   PLT 171.0 05/28/2020   GLUCOSE 83 05/28/2020   CHOL 136 02/13/2020   TRIG 76.0 02/13/2020   HDL 31.80 (L) 02/13/2020   LDLCALC 89 02/13/2020   ALT 19 05/28/2020   AST 20 05/28/2020   NA 137 05/28/2020   K 4.1 05/28/2020   CL 103 05/28/2020   CREATININE 0.30 (L) 05/28/2020   BUN 9 05/28/2020   CO2 26 05/28/2020   TSH 1.61 02/13/2020   HGBA1C 5.0 04/29/2007     Lab Results  Component Value Date   TSH 1.61 02/13/2020   Lab Results  Component Value Date   WBC 4.5 05/28/2020   HGB 14.8 05/28/2020   HCT 43.7 05/28/2020   MCV 94.7 05/28/2020   PLT 171.0 05/28/2020   Lab Results  Component Value Date   NA 137 05/28/2020   K 4.1 05/28/2020   CO2 26 05/28/2020   GLUCOSE 83 05/28/2020   BUN 9 05/28/2020   CREATININE 0.30 (L) 05/28/2020   BILITOT 0.4 05/28/2020   ALKPHOS 70 05/28/2020   AST 20 05/28/2020   ALT 19 05/28/2020   PROT 6.9 05/28/2020   ALBUMIN 4.2 05/28/2020   CALCIUM 9.0 05/28/2020   ANIONGAP 11 09/18/2014   GFR 338.86 05/28/2020   Lab Results  Component Value Date   CHOL 136 02/13/2020   Lab Results  Component Value Date   HDL 31.80 (L) 02/13/2020   Lab Results  Component Value Date   LDLCALC 89 02/13/2020   Lab Results  Component Value Date   TRIG 76.0 02/13/2020   Lab Results  Component Value Date   CHOLHDL 4 02/13/2020   Lab Results  Component Value Date   HGBA1C 5.0 04/29/2007       Assessment & Plan:   Problem List Items Addressed  This Visit    Infantile cerebral palsy ([place])    Lifelong spastic paraplegia resulting in ongoing need for incontinence supply. Using external catheter with good management and no trouble with skin breakdown or complications.       Incontinence of feces    Lifelong and well managed with current care. Filled out paperwork to keep patient supplied with up to 200 disposable undergarments monthly. Mother reports unless he is having diarrhea he has enough warned to let them get him to the toilet      Urinary incontinence    Doing well with current regimen paperwork filled out to get the patient incontinence supplies needed for the year. Including wipes, external catheters and more.          I am having Lajuana Matte "[name]" maintain his divalproex, Omeprazole-Sodium Bicarbonate, NON FORMULARY, NON FORMULARY, Protective Barrier Wipes, Prevail Breezers Medium,  bacitracin, NONFORMULARY OR COMPOUNDED ITEM, LORazepam, PARoxetine, AMBULATORY NON FORMULARY MEDICATION, OLANZapine, mupirocin ointment, All-Body Massage, sucralfate, clotrimazole-betamethasone, dantrolene, feeding supplement (KATE FARMS STANDARD 1.4), and ciprofloxacin.  No orders of the defined types were placed in this encounter.    I discussed the assessment and treatment plan with the patient. The patient was provided an opportunity to ask questions and all were answered. The patient agreed with the plan and demonstrated an understanding of the instructions.   The patient was advised to call back or seek an in-person evaluation if the symptoms worsen or if the condition fails to improve as anticipated.  I provided 20 minutes of non-face-to-face time during this encounter.   [name], MD

## 2020-07-17 NOTE — Assessment & Plan Note (Signed)
Lifelong and well managed with current care. Filled out paperwork to keep patient supplied with up to 200 disposable undergarments monthly. Mother reports unless he is having diarrhea he has enough warned to let them get him to the toilet

## 2020-07-26 ENCOUNTER — Telehealth: Payer: Self-pay | Admitting: Family Medicine

## 2020-07-26 NOTE — Telephone Encounter (Signed)
Caller name: Overton Mam ([employer] Call back number: [phone]  Overton Mam wants to know if you received the clinical note form.

## 2020-07-26 NOTE — Telephone Encounter (Signed)
Faxing Clinical Form.

## 2020-08-06 ENCOUNTER — Encounter: Payer: Self-pay | Admitting: Physical Therapy

## 2020-08-06 ENCOUNTER — Other Ambulatory Visit: Payer: Self-pay

## 2020-08-06 ENCOUNTER — Ambulatory Visit: Payer: Medicare Other | Attending: Physical Medicine & Rehabilitation | Admitting: Physical Therapy

## 2020-08-06 DIAGNOSIS — G800 Spastic quadriplegic cerebral palsy: Secondary | ICD-10-CM | POA: Diagnosis not present

## 2020-08-07 NOTE — Therapy (Signed)
[facility] [address] [place], [place], [address] Phone: [phone]   Fax:  [phone]  Physical Therapy Evaluation  Patient Details  Name: [name] MRN: [id number] Date of Birth: Mar 12, 1986 Referring Provider (PT): Dr. Alger Simons   Encounter Date: 08/06/2020   PT End of Session - 08/07/20 1638    Visit Number 1    Number of Visits 2    Date for PT Re-Evaluation 11/01/20    Authorization Type Medicare    Authorization Time Period 08-06-20 - 11-01-20    PT Start Time 1315    PT Stop Time 1402    PT Time Calculation (min) 47 min    Activity Tolerance Patient tolerated treatment well    Behavior During Therapy [facility] for tasks assessed/performed           Past Medical History:  Diagnosis Date  . Cerebral palsy ([place])   . Dehydration 11/22/2013  . Depression with anxiety 08/01/2010   Qualifier: Diagnosis of  By: Nelson-Smith CMA (AAMA), Dottie    . Dyslipidemia 08/19/2017  . Esophagitis 2011  . Gastrostomy in place [facility]) 08/31/2013  . GERD (gastroesophageal reflux disease)   . Hyperlipidemia, mild 08/25/2015  . Hyperthyroidism   . Incontinence of feces   . Loss of weight 08/28/2014  . Medicare annual wellness visit, subsequent 08/25/2015  . Mildly underweight adult 03/16/2017  . Palpitations   . Skin lesion of right ear 03/16/2017  . Thyroid disease 08/01/2010   Qualifier: Diagnosis of  By: Harlon Ditty CMA (AAMA), Dottie      Past Surgical History:  Procedure Laterality Date  . baclofen trial    . baslofen pump implant    . ears tubes    . EYE SURGERY    . FLEXIBLE SIGMOIDOSCOPY N/A 09/07/2014   Procedure: FLEXIBLE SIGMOIDOSCOPY;  Surgeon: [name], MD;  Location: [facility] ENDOSCOPY;  Service: Endoscopy;  Laterality: N/A;  . g-tube insert  August 2006  . hamstring released     to treat contractures.   Marland Kitchen HIP SURGERY     x2 , side   . IR CM INJ ANY COLONIC TUBE W/FLUORO  05/28/2017  . IR CM INJ  ANY COLONIC TUBE W/FLUORO  07/07/2019  . IR CM INJ ANY COLONIC TUBE W/FLUORO  09/18/2019  . IR GASTR TUBE CONVERT GASTR-JEJ PER W/FL MOD SED  03/14/2019  . IR GENERIC HISTORICAL  07/01/2016   IR GASTR TUBE CONVERT GASTR-JEJ PER W/FL MOD SED 07/01/2016 [name], MD [facility]  . IR GENERIC HISTORICAL  07/08/2016   IR PATIENT EVAL TECH 0-60 MINS 07/08/2016 [name], MD [facility]  . IR GENERIC HISTORICAL  07/14/2016   IR GJ TUBE CHANGE 07/14/2016 [name], MD [facility]  . IR GENERIC HISTORICAL  07/21/2016   IR PATIENT EVAL TECH 0-60 MINS [facility]  . IR GENERIC HISTORICAL  08/31/2016   IR [place] DUODEN/JEJUNO TUBE PERCUT W/FLUORO 08/31/2016 [name], MD [facility]  . IR GENERIC HISTORICAL  09/03/2016   IR GJ TUBE CHANGE 09/03/2016 [name], MD [facility]  . IR GENERIC HISTORICAL  09/10/2016   IR GASTR TUBE CONVERT GASTR-JEJ PER W/FL MOD SED 09/10/2016 [facility]  . IR GENERIC HISTORICAL  09/16/2016   IR PATIENT EVAL TECH 0-60 MINS [facility]  . IR GENERIC HISTORICAL  09/29/2016   IR GJ TUBE CHANGE 09/29/2016 [name], MD [facility]  . IR GJ TUBE CHANGE  02/05/2017  . IR GJ TUBE  CHANGE  05/21/2017  . IR GJ TUBE CHANGE  08/25/2017  . IR GJ TUBE CHANGE  01/18/2018  . IR GJ TUBE CHANGE  02/04/2018  . IR GJ TUBE CHANGE  04/21/2018  . IR GJ TUBE CHANGE  08/18/2018  . IR GJ TUBE CHANGE  09/12/2019  . IR GJ TUBE CHANGE  01/03/2020  . IR GJ TUBE CHANGE  05/13/2020  . IR MECH REMOV OBSTRUC MAT ANY COLON TUBE W/FLUORO  09/02/2018  . IR REPLC GASTRO/COLONIC TUBE PERCUT W/FLUORO  10/17/2018  . PEG PLACEMENT  10/21/2011   Procedure: PERCUTANEOUS ENDOSCOPIC GASTROSTOMY (PEG) REPLACEMENT;  Surgeon: [name], MD;  [facility];  Service: Endoscopy;  Laterality: N/A;  . PEG PLACEMENT N/A 06/13/2013   Procedure: PERCUTANEOUS ENDOSCOPIC GASTROSTOMY (PEG) REPLACEMENT;  Surgeon: [name], MD;  [facility] END[facility];  Service: Endoscopy;  Laterality: N/A;   . SPINAL FUSION    . spinal fusion to correct 70 degree kyphosis  11-2010  . spinal fusioncorrect 106 degree kyphosis    . SPINE SURGERY  ,11/20/2010, 2011   for correction of severe contracturing spinal kyphosis.   . TONSILLECTOMY      There were no vitals filed for this visit.    Subjective Assessment - 08/06/20 1330    Subjective Pt presents for PT eval - accompanied by mother and aide, Roselyn Reef; mother states pt was having pain at time the PT referral was made but says this pain has now resolved, and pt confirms this statement.  Mother states pt was unable to tolerate sitting in wheelchair - they did not know why - and then realized that the back had somewhat melted and was hard plastic without much cushioning in the pad and was pressing hard into pt's back/spine. Mother states she ordered a back cushion from [place] (is currently in w/c now) and pt states pain is resolved.  Pt denies having the hip and back pain as stated on initial PT referral.    Patient is accompained by: Family member    Pertinent History spastic quadriplegic CP, scoliosis with rod implant    Patient Stated Goals prevent me from sliding forward in the wheelchair (to increase comfort with sitting in wheelchair) -- informed pt & mother that new w/c would be needed in order for this to happen    Currently in Pain? No/denies              [facility] PT Assessment - 08/07/20 0001      Assessment   Medical Diagnosis Spastic quadriplegic CP    Referring Provider (PT) Dr. Alger Simons    Onset Date/Surgical Date --   congenital - increased pain onset July 2021   Prior Therapy pt has had PT at this facility several times - most recent was 2018      Precautions   Precautions [place]   Has the patient fallen in the past 6 months No    Has the patient had a decrease in activity level because of a fear of falling?  No    Is the patient reluctant to leave their home because of a fear of falling?  No       Prior Function   Level of Independence Needs assistance with transfers;Needs assistance with ADLs   dependent for all mobility and ADL's     Bed Mobility   Bed Mobility Supine to Sit    Supine to Sit Dependent - Patient equal 0%  Transfers   Transfers Stand Pivot Transfers    Stand Pivot Transfers 1: +1 Total assist      Ambulation/Gait   Ambulation/Gait No            Complete power wheelchair eval to be completed with vendor from [place] (mother's choice)          Objective measurements completed on examination: See above findings.               PT Education - 08/07/20 1635    Education Details discussed need for new power wheelchair so that pt can be comfortable with prolonged sitting and minimize sliding forward (due to extensor tone) in order to increase comfort with sitting by minimizing pressure of seat belt on pelvis    Person(s) Educated Patient;Parent(s)    Methods Explanation    Comprehension Verbalized understanding               PT Long Term Goals - 08/07/20 1652      PT LONG TERM GOAL #1   Title Complete wheelchair evaluation with vendor from [place], per mother's choice.    Time 4    Period Weeks    Status New    Target Date 09/06/20                  Plan - 08/07/20 1639    Clinical Impression Statement Pt is a 34 yr old gentleman with spastic quadriplegic CP; pt confirms that his hip and back pain as resolved since initial PT referral was made in early August 2021.  Mother states she ordered a wheelchair pad for pt's wheelchair which solved the back pain by eliminating the pressure that was being produced by the hard plastic back on wheelchair without cushioning.  Pt 's caregiver reports that stretching and PROM for bil. UE's and LE's is being done at least 2x/day.  Pt would benefit from new power wheelchair as his current wheelchair is no longer meeting his needs.    Personal Factors and  Comorbidities Past/Current Experience;Comorbidity 1;Time since onset of injury/illness/exacerbation    Comorbidities scoliosis with rod implant, dysphagia, G tube in place    Examination-Activity Limitations Bathing;Bed Mobility;Locomotion Level;Transfers;Toileting;Stairs;Squat;Self Feeding;Bend    Examination-Participation Restrictions Community Activity;Shop;Laundry;Meal Prep    Stability/Clinical Decision Making Stable/Uncomplicated    Clinical Decision Making Low    Rehab Potential Good    PT Frequency 1x / week    PT Duration 3 weeks    PT Treatment/Interventions ADLs/Self Care Home Management;Patient/family education;Other (comment)   wheelchair management   PT Next Visit Plan wheelchair eval with [employer] (mother's request of w/c companies)    Recommended Other Services w/c eval with [facility] and Agree with Plan of Care Patient;Family member/caregiver    Family Member Consulted mother [name]           Patient will benefit from skilled therapeutic intervention in order to improve the following deficits and impairments:  Difficulty walking, Decreased balance, Decreased mobility, Hypomobility, Impaired tone, Impaired UE functional use, Impaired flexibility, Decreased coordination, Decreased range of motion, Decreased endurance  Visit Diagnosis: Spastic quadriplegic cerebral palsy ([place]) - Plan: PT plan of care cert/re-cert     Problem List Patient Active Problem List   Diagnosis Date Noted  . Close exposure to COVID-19 virus 10/16/2019  . Mildly underweight adult 03/16/2017  . Skin lesion of right ear 03/16/2017  . Increased oropharyngeal secretions 04/14/2016  . Medicare annual wellness visit, subsequent  08/25/2015  . Hyperlipidemia, mild 08/25/2015  . Chronic GERD 12/17/2014  . CAP (community acquired pneumonia) 12/17/2014  . Rectal bleeding 09/06/2014  . Esophageal dysphagia 09/06/2014  . Dysphagia, pharyngoesophageal phase 09/02/2014   . Impetigo 09/02/2014  . Abnormal thyroid function test 06/10/2014  . Acute nonsuppurative otitis media of right ear 05/13/2014  . Pneumonia 02/27/2014  . Protein-calorie malnutrition, severe ([place]) 11/23/2013  . Decreased oral intake 11/22/2013  . 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 Cervical dystonia 08/23/2013  . Dysphagia, oropharyngeal phase 08/10/2013  . Low back pain 07/11/2013  . 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 Anemia 12/05/2010  . 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 GASTROSTOMY COMPLICATION 80/99/8338  . Infantile cerebral palsy ([place]) 08/04/2010  . Thyroid disease 08/01/2010  . Depression with anxiety 08/01/2010  . GERD 08/01/2010  . OTHER ACNE 10/16/2009  . PALPITATIONS 02/25/2009  . HIP PAIN, BILATERAL 11/06/2008  . NEVI, MULTIPLE 10/09/2008  . Incontinence of feces 04/02/2008  . Urinary incontinence 04/02/2008  . 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 FACIAL RASH 04/29/2007  . PALSY, INFANTILE CEREBRAL, 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    Alda Lea, PT 08/07/2020, 4:55 PM  [facility] [address] [place], [place], [address] Phone: [phone]   Fax:  [phone]  Name: [name] MRN: [id number] Date of Birth: 16-Feb-1986

## 2020-08-08 DIAGNOSIS — F339 Major depressive disorder, recurrent, unspecified: Secondary | ICD-10-CM | POA: Diagnosis not present

## 2020-08-08 IMAGING — XA IR REPLACE G/J TUBE W/ FLUORO
1 series · 4 of 4 positions shown · non-contrast
Comparison: none

INDICATION: 33-year-old male with cerebral palsy and a gastrojejunostomy
presents for replacement of a poorly functioning gastro J.

[Series 3: fl - angio · 4 of 17 frames shown]
[frame 3/17]
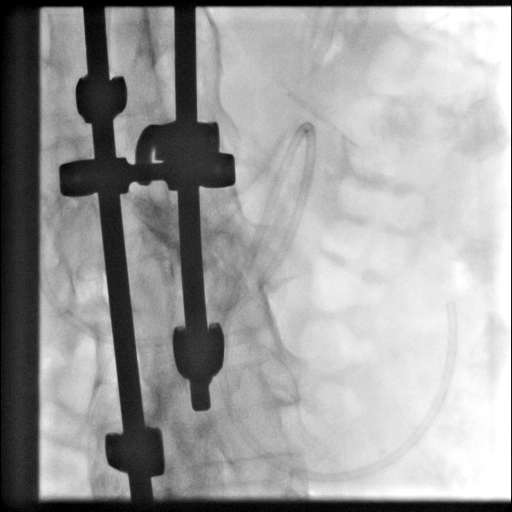
[frame 4/17]
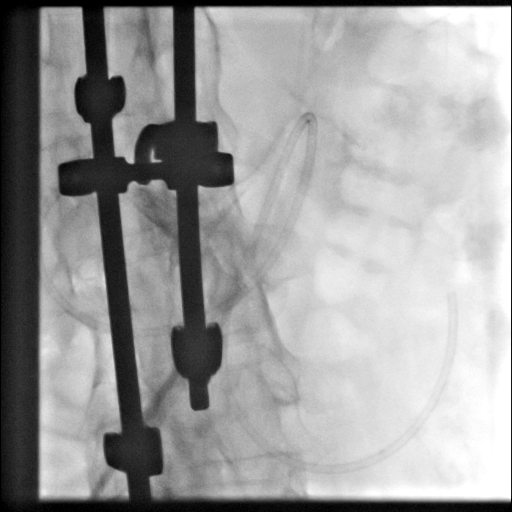
[frame 9/17]
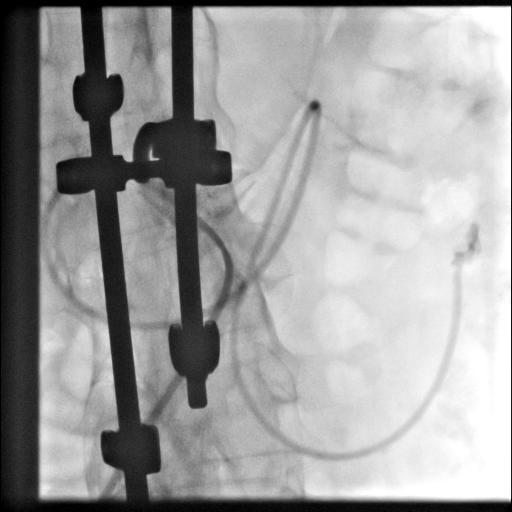
[frame 15/17]
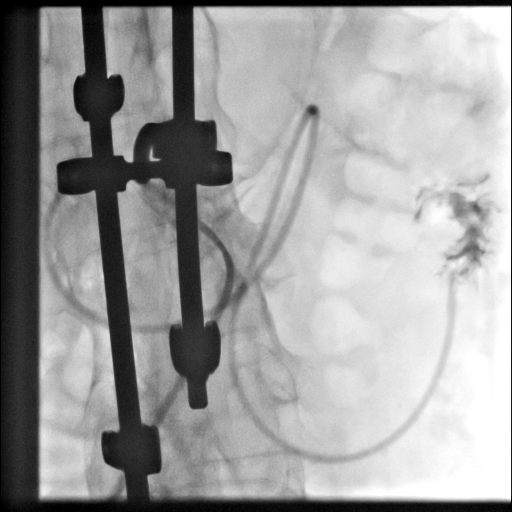

[4 of 4 positions shown; findings below may reference images not displayed]

EXAM:
JEJUNAL CATHETER REPLACEMENT

MEDICATIONS:
None

ANESTHESIA/SEDATION:
None

CONTRAST:  10 cc-administered into the gastric lumen.

FLUOROSCOPY TIME:  Fluoroscopy Time: 1 minutes 30 seconds (17 mGy).

COMPLICATIONS:
None

PROCEDURE:
Informed written consent was obtained from the patient and the
patient's family after a thorough discussion of the procedural
risks, benefits and alternatives. All questions were addressed.
Maximal Sterile Barrier Technique was utilized including caps, mask,
sterile gowns, sterile gloves, sterile drape, hand hygiene and skin
antiseptic. A timeout was performed prior to the initiation of the
procedure.

The epigastrium including the indwelling tube was prepped with
Betadine in a sterile fashion, and a sterile drape was applied
covering the operative field. A sterile gown and sterile gloves were
used for the procedure.

Contrast was injected confirming location.

Balloon was deflated and then the tube was ligated. A Kumpe the
catheter was advanced into the jejunal limb as far as the Kumpe the
would progress. A stiff Glidewire was then manipulated through the J
port, into the jejunum.

Modified Seldinger technique was then used to replace the feeding
tube with a new 24 French gastrojejunostomy. The distal aspect of
the J portion was ligated to match the previous, approximately 6
inches.

Contrast confirmed location and the balloon was inflated for
retention.

Patient tolerated the procedure well and remained hemodynamically
stable throughout.

No complications were encountered and no significant blood loss
encountered.
IMPRESSION: Status post fluoroscopic replacement of 24 French gastrojejunostomy
tube.

## 2020-08-13 ENCOUNTER — Telehealth: Payer: Self-pay | Admitting: Family Medicine

## 2020-08-13 NOTE — Telephone Encounter (Signed)
Duoderm patches apply to affected area qod or as needed. Please give VO

## 2020-08-13 NOTE — Telephone Encounter (Signed)
Mother states the patient has a pressure sore on left side below the hip. The [facility] nurse recommends Duoderm patches.   [place], [place]  [place], [place] [address]  Phone:  [phone] Fax:  [phone]

## 2020-08-15 MED ORDER — DUODERM CGF DRESSING EX MISC: CUTANEOUS | 0 refills | Status: DC

## 2020-08-15 NOTE — Telephone Encounter (Signed)
Mother notified.  Spoke with pharmacy and they advised to just send in rx and put note on it to pick appropriate size product on patient need.

## 2020-08-15 NOTE — Addendum Note (Signed)
Addended by: Kem Boroughs D on: 08/15/2020 09:21 AM   Modules accepted: Orders

## 2020-08-16 ENCOUNTER — Other Ambulatory Visit: Payer: Self-pay

## 2020-08-16 NOTE — Telephone Encounter (Signed)
Dressing approved 10/14

## 2020-08-22 ENCOUNTER — Other Ambulatory Visit: Payer: Self-pay

## 2020-08-22 ENCOUNTER — Ambulatory Visit: Payer: Medicare Other | Admitting: Physical Therapy

## 2020-08-22 DIAGNOSIS — G800 Spastic quadriplegic cerebral palsy: Secondary | ICD-10-CM

## 2020-08-23 ENCOUNTER — Encounter: Payer: Self-pay | Admitting: Physical Therapy

## 2020-08-23 NOTE — Therapy (Signed)
[facility] [address] [place], [place], [address] Phone: [phone]   Fax:  [phone]  Physical Therapy Treatment  Patient Details  Name: [name] MRN: [id number] Date of Birth: 21-Nov-1985 Referring Provider (PT): Dr. Alger Simons   Encounter Date: 08/22/2020   PT End of Session - 08/23/20 1615    Visit Number 2    Number of Visits 3    Date for PT Re-Evaluation 11/01/20    Authorization Type Medicare    Authorization Time Period 08-06-20 - 11-01-20    PT Start Time 1533    PT Stop Time 1650    PT Time Calculation (min) 77 min    Activity Tolerance Patient tolerated treatment well    Behavior During Therapy [facility] for tasks assessed/performed           Past Medical History:  Diagnosis Date  . Cerebral palsy ([place])   . Dehydration 11/22/2013  . Depression with anxiety 08/01/2010   Qualifier: Diagnosis of  By: Nelson-Smith CMA (AAMA), Dottie    . Dyslipidemia 08/19/2017  . Esophagitis 2011  . Gastrostomy in place [facility]) 08/31/2013  . GERD (gastroesophageal reflux disease)   . Hyperlipidemia, mild 08/25/2015  . Hyperthyroidism   . Incontinence of feces   . Loss of weight 08/28/2014  . Medicare annual wellness visit, subsequent 08/25/2015  . Mildly underweight adult 03/16/2017  . Palpitations   . Skin lesion of right ear 03/16/2017  . Thyroid disease 08/01/2010   Qualifier: Diagnosis of  By: Harlon Ditty CMA (AAMA), Dottie      Past Surgical History:  Procedure Laterality Date  . baclofen trial    . baslofen pump implant    . ears tubes    . EYE SURGERY    . FLEXIBLE SIGMOIDOSCOPY N/A 09/07/2014   Procedure: FLEXIBLE SIGMOIDOSCOPY;  Surgeon: [name], MD;  Location: [facility] ENDOSCOPY;  Service: Endoscopy;  Laterality: N/A;  . g-tube insert  August 2006  . hamstring released     to treat contractures.   Marland Kitchen HIP SURGERY     x2 , side   . IR CM INJ ANY COLONIC TUBE W/FLUORO  05/28/2017  . IR CM INJ  ANY COLONIC TUBE W/FLUORO  07/07/2019  . IR CM INJ ANY COLONIC TUBE W/FLUORO  09/18/2019  . IR GASTR TUBE CONVERT GASTR-JEJ PER W/FL MOD SED  03/14/2019  . IR GENERIC HISTORICAL  07/01/2016   IR GASTR TUBE CONVERT GASTR-JEJ PER W/FL MOD SED 07/01/2016 [name], MD [facility]  . IR GENERIC HISTORICAL  07/08/2016   IR PATIENT EVAL TECH 0-60 MINS 07/08/2016 [name], MD [facility]  . IR GENERIC HISTORICAL  07/14/2016   IR GJ TUBE CHANGE 07/14/2016 [name], MD [facility]  . IR GENERIC HISTORICAL  07/21/2016   IR PATIENT EVAL TECH 0-60 MINS [facility]  . IR GENERIC HISTORICAL  08/31/2016   IR [place] DUODEN/JEJUNO TUBE PERCUT W/FLUORO 08/31/2016 [name], MD [facility]  . IR GENERIC HISTORICAL  09/03/2016   IR GJ TUBE CHANGE 09/03/2016 [name], MD [facility]  . IR GENERIC HISTORICAL  09/10/2016   IR GASTR TUBE CONVERT GASTR-JEJ PER W/FL MOD SED 09/10/2016 [facility]  . IR GENERIC HISTORICAL  09/16/2016   IR PATIENT EVAL TECH 0-60 MINS [facility]  . IR GENERIC HISTORICAL  09/29/2016   IR GJ TUBE CHANGE 09/29/2016 [name], MD [facility]  . IR GJ TUBE CHANGE  02/05/2017  . IR GJ TUBE  CHANGE  05/21/2017  . IR GJ TUBE CHANGE  08/25/2017  . IR GJ TUBE CHANGE  01/18/2018  . IR GJ TUBE CHANGE  02/04/2018  . IR GJ TUBE CHANGE  04/21/2018  . IR GJ TUBE CHANGE  08/18/2018  . IR GJ TUBE CHANGE  09/12/2019  . IR GJ TUBE CHANGE  01/03/2020  . IR GJ TUBE CHANGE  05/13/2020  . IR MECH REMOV OBSTRUC MAT ANY COLON TUBE W/FLUORO  09/02/2018  . IR REPLC GASTRO/COLONIC TUBE PERCUT W/FLUORO  10/17/2018  . PEG PLACEMENT  10/21/2011   Procedure: PERCUTANEOUS ENDOSCOPIC GASTROSTOMY (PEG) REPLACEMENT;  Surgeon: [name], MD;  [facility];  Service: Endoscopy;  Laterality: N/A;  . PEG PLACEMENT N/A 06/13/2013   Procedure: PERCUTANEOUS ENDOSCOPIC GASTROSTOMY (PEG) REPLACEMENT;  Surgeon: [name], MD;  [facility];  Service: Endoscopy;  Laterality: N/A;    . SPINAL FUSION    . spinal fusion to correct 70 degree kyphosis  11-2010  . spinal fusioncorrect 106 degree kyphosis    . SPINE SURGERY  ,11/20/2010, 2011   for correction of severe contracturing spinal kyphosis.   . TONSILLECTOMY      There were no vitals filed for this visit.   Subjective Assessment - 08/23/20 1610    Subjective Pt presents for wheelchair eval with vendor Doug Cobb, [employer], with [employer]; discussed power vs. manual tilt in space - and features pt's mother requests on new wheelchair in regards to pt's current needs   pt's mother reports pt has had skin breakdown on Lt lateral hip since he was here a few weeks ago   Patient is accompained by: Family member    Pertinent History spastic quadriplegic CP, scoliosis with rod implant    Patient Stated Goals prevent me from sliding forward in the wheelchair (to increase comfort with sitting in wheelchair) -- informed pt & mother that new w/c would be needed in order for this to happen    Currently in Pain? No/denies              Vendor from [place], [employer] present to discuss wheelchair and features needed to properly position patient in  Wheelchair and accomodate his postural deformities.  Manual tilt in space vs. Power wheelchair was discussed - pt and mother prefer to obtain Power wheelchair if able to qualify (if pt able to safely drive and operate) with plan for "T" joystick to be used.  Home eval scheduled for 08-30-20; plan to do custom molded back to accommodate deformity; will continue to evaluate and assess for appropriate Seat cushion                        PT Education - 08/23/20 1613    Education Details Home visit for power wheelchair trial scheduled for Friday, Oct. 29 ([employer], operational) ; pt to trial driving a mid-wheel drive power wheelchair    Person(s) Educated Patient    Methods Explanation    Comprehension Verbalized understanding                PT Long Term Goals - 08/23/20 1621      PT LONG TERM GOAL #1   Title Complete wheelchair evaluation with vendor from [place], per mother's choice.    Time 4    Period Weeks    Status New                 Plan - 08/23/20  61    Clinical Impression Statement Pt was evaluated for power wheelchair (pt and mother request power wheelchair rather than manual tilt in space w/c) with vendor Lucita Ferrara, [employer] with [employer] present for eval.  Mother requests attendant control on w/c for ability to drive w/c for patient in community when needed.  Postural assessment was completed - pt's posture is mostly fixed due to spinal fusions - entire spine C1 - L5. W/c eval to be completed after trial of mid wheel drive power w/c and "T" joystick trial for pt to maneuver easier with use of his knuckles on Lt hand.    Personal Factors and Comorbidities Past/Current Experience;Comorbidity 1;Time since onset of injury/illness/exacerbation    Comorbidities scoliosis with rod implant, dysphagia, G tube in place    Examination-Activity Limitations Bathing;Bed Mobility;Locomotion Level;Transfers;Toileting;Stairs;Squat;Self Feeding;Bend    Examination-Participation Restrictions Community Activity;Shop;Laundry;Meal Prep    Stability/Clinical Decision Making Stable/Uncomplicated    Rehab Potential Good    PT Frequency 1x / week    PT Duration 3 weeks    PT Treatment/Interventions ADLs/Self Care Home Management;Patient/family education;Other (comment)   wheelchair management   PT Next Visit Plan wheelchair eval with [employer] (mother's request of w/c companies)    Consulted and Agree with Plan of Care Patient;Family member/caregiver    Family Member Consulted mother [name]           Patient will benefit from skilled therapeutic intervention in order to improve the following deficits and impairments:  Difficulty walking, Decreased balance, Decreased mobility,  Hypomobility, Impaired tone, Impaired UE functional use, Impaired flexibility, Decreased coordination, Decreased range of motion, Decreased endurance  Visit Diagnosis: Spastic quadriplegic cerebral palsy [facility])     Problem List Patient Active Problem List   Diagnosis Date Noted  . Close exposure to COVID-19 virus 10/16/2019  . Mildly underweight adult 03/16/2017  . Skin lesion of right ear 03/16/2017  . Increased oropharyngeal secretions 04/14/2016  . Medicare annual wellness visit, subsequent 08/25/2015  . Hyperlipidemia, mild 08/25/2015  . Chronic GERD 12/17/2014  . CAP (community acquired pneumonia) 12/17/2014  . Rectal bleeding 09/06/2014  . Esophageal dysphagia 09/06/2014  . Dysphagia, pharyngoesophageal phase 09/02/2014  . Impetigo 09/02/2014  . Abnormal thyroid function test 06/10/2014  . Acute nonsuppurative otitis media of right ear 05/13/2014  . Pneumonia 02/27/2014  . Protein-calorie malnutrition, severe ([place]) 11/23/2013  . Decreased oral intake 11/22/2013  . 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 Cervical dystonia 08/23/2013  . Dysphagia, oropharyngeal phase 08/10/2013  . Low back pain 07/11/2013  . 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 Anemia 12/05/2010  . 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 GASTROSTOMY COMPLICATION 79/39/0300  . Infantile cerebral palsy ([place]) 08/04/2010  . Thyroid disease 08/01/2010  . Depression with anxiety 08/01/2010  . GERD 08/01/2010  . OTHER ACNE 10/16/2009  . PALPITATIONS 02/25/2009  . HIP PAIN, BILATERAL 11/06/2008  . NEVI, MULTIPLE 10/09/2008  . Incontinence of feces 04/02/2008  . Urinary incontinence 04/02/2008  . 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 FACIAL RASH 04/29/2007  . PALSY, INFANTILE CEREBRAL, 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    Alda Lea, PT 08/23/2020, 4:23 PM  [facility] [address] [place], [place], [address] Phone: [phone]    Fax:  [phone]  Name: [name] MRN: [id number] Date of Birth: 1985-12-27

## 2020-09-02 ENCOUNTER — Other Ambulatory Visit: Payer: Self-pay

## 2020-09-02 ENCOUNTER — Ambulatory Visit (INDEPENDENT_AMBULATORY_CARE_PROVIDER_SITE_OTHER): Payer: Medicare Other | Admitting: Family Medicine

## 2020-09-02 ENCOUNTER — Encounter: Payer: Self-pay | Admitting: Family Medicine

## 2020-09-02 VITALS — BP 110/62 | HR 95 | Temp 98.4°F

## 2020-09-02 DIAGNOSIS — R002 Palpitations: Secondary | ICD-10-CM | POA: Diagnosis not present

## 2020-09-02 DIAGNOSIS — Z23 Encounter for immunization: Secondary | ICD-10-CM

## 2020-09-02 DIAGNOSIS — Z79899 Other long term (current) drug therapy: Secondary | ICD-10-CM | POA: Diagnosis not present

## 2020-09-02 DIAGNOSIS — G8250 Quadriplegia, unspecified: Secondary | ICD-10-CM | POA: Diagnosis not present

## 2020-09-02 DIAGNOSIS — R1312 Dysphagia, oropharyngeal phase: Secondary | ICD-10-CM

## 2020-09-02 NOTE — Progress Notes (Signed)
Subjective:    Patient ID: [name], male    DOB: [birth year], 34 y.o.   MRN: [id number]  Chief Complaint  Patient presents with  . Follow-up    HPI Patient is in today for follow up on chronic medical concerns. They are working on getting [name] a new wheelchair. The effort started with an increase in back pain due to his cushions being worn out. But now his chair is noted to be older than 5 years and it is difficult for it to work in his home whereas the newer chairs turn better and are shorter so they can fit in his home better. Denies CP/palp/SOB/HA/congestion/fevers/GI or GU c/o. Taking meds as prescribed. He did have a limited episode of tachycardia and increased RR about ten days ago but it was self limited and improved on it's own.  Past Medical History:  Diagnosis Date  . Cerebral palsy ([place])   . Dehydration 11/22/2013  . Depression with anxiety 08/01/2010   Qualifier: Diagnosis of  By: Nelson-Smith CMA (AAMA), Dottie    . Dyslipidemia 08/19/2017  . Esophagitis 2011  . Gastrostomy in place [facility]) 08/31/2013  . GERD (gastroesophageal reflux disease)   . Hyperlipidemia, mild 08/25/2015  . Hyperthyroidism   . Incontinence of feces   . Loss of weight 08/28/2014  . Medicare annual wellness visit, subsequent 08/25/2015  . Mildly underweight adult 03/16/2017  . Palpitations   . Skin lesion of right ear 03/16/2017  . Thyroid disease 08/01/2010   Qualifier: Diagnosis of  By: Harlon Ditty CMA (AAMA), Dottie      Past Surgical History:  Procedure Laterality Date  . baclofen trial    . baslofen pump implant    . ears tubes    . EYE SURGERY    . FLEXIBLE SIGMOIDOSCOPY N/A 09/07/2014   Procedure: FLEXIBLE SIGMOIDOSCOPY;  Surgeon: [name], MD;  Location: [facility] ENDOSCOPY;  Service: Endoscopy;  Laterality: N/A;  . g-tube insert  August 2006  . hamstring released     to treat contractures.   Marland Kitchen HIP SURGERY     x2 , side   . IR CM INJ ANY COLONIC TUBE W/FLUORO  05/28/2017  . IR CM  INJ ANY COLONIC TUBE W/FLUORO  07/07/2019  . IR CM INJ ANY COLONIC TUBE W/FLUORO  09/18/2019  . IR GASTR TUBE CONVERT GASTR-JEJ PER W/FL MOD SED  03/14/2019  . IR GENERIC HISTORICAL  07/01/2016   IR GASTR TUBE CONVERT GASTR-JEJ PER W/FL MOD SED 07/01/2016 [name], MD [facility]INTERV RAD  . IR GENERIC HISTORICAL  07/08/2016   IR PATIENT EVAL TECH 0-60 MINS 07/08/2016 [name], MD [facility]  . IR GENERIC HISTORICAL  07/14/2016   IR GJ TUBE CHANGE 07/14/2016 [name], MD [facility]  . IR GENERIC HISTORICAL  07/21/2016   IR PATIENT EVAL TECH 0-60 MINS [facility]  . IR GENERIC HISTORICAL  08/31/2016   IR [place] DUODEN/JEJUNO TUBE PERCUT W/FLUORO 08/31/2016 [name], MD [facility]  . IR GENERIC HISTORICAL  09/03/2016   IR GJ TUBE CHANGE 09/03/2016 [name], MD [facility]  . IR GENERIC HISTORICAL  09/10/2016   IR GASTR TUBE CONVERT GASTR-JEJ PER W/FL MOD SED 09/10/2016 [facility]  . IR GENERIC HISTORICAL  09/16/2016   IR PATIENT EVAL TECH 0-60 MINS [facility]  . IR GENERIC HISTORICAL  09/29/2016   IR GJ TUBE CHANGE 09/29/2016 [name], MD [facility]  . IR GJ TUBE CHANGE  02/05/2017  . IR GJ TUBE  CHANGE  05/21/2017  . IR GJ TUBE CHANGE  08/25/2017  . IR GJ TUBE CHANGE  01/18/2018  . IR GJ TUBE CHANGE  02/04/2018  . IR GJ TUBE CHANGE  04/21/2018  . IR GJ TUBE CHANGE  08/18/2018  . IR GJ TUBE CHANGE  09/12/2019  . IR GJ TUBE CHANGE  01/03/2020  . IR GJ TUBE CHANGE  05/13/2020  . IR MECH REMOV OBSTRUC MAT ANY COLON TUBE W/FLUORO  09/02/2018  . IR REPLC GASTRO/COLONIC TUBE PERCUT W/FLUORO  10/17/2018  . PEG PLACEMENT  10/21/2011   Procedure: PERCUTANEOUS ENDOSCOPIC GASTROSTOMY (PEG) REPLACEMENT;  Surgeon: [name], MD;  Location: [facility] ENDOSCOPY;  Service: Endoscopy;  Laterality: N/A;  . PEG PLACEMENT N/A 06/13/2013   Procedure: PERCUTANEOUS ENDOSCOPIC GASTROSTOMY (PEG) REPLACEMENT;  Surgeon: [name], MD;  Location: [facility] ENDOSCOPY;  Service: Endoscopy;  Laterality:  N/A;  . SPINAL FUSION    . spinal fusion to correct 70 degree kyphosis  11-2010  . spinal fusioncorrect 106 degree kyphosis    . SPINE SURGERY  ,11/20/2010, 2011   for correction of severe contracturing spinal kyphosis.   . TONSILLECTOMY      Family History  Problem Relation Age of Onset  . Asthma Mother   . Hyperlipidemia Mother   . COPD Mother   . Other Mother        bronchial stasis/ABPA  . Cancer Maternal Grandmother 41       breast  . Hyperlipidemia Maternal Grandmother   . Hypertension Maternal Grandmother   . Cancer Maternal Grandfather        prostate  . Heart disease Paternal Grandfather        CHF  . Osteoporosis Paternal Grandmother   . Arthritis Paternal Grandmother        rheumatoid    Social History   Socioeconomic History  . Marital status: Single    Spouse name: Not on file  . Number of children: 0  . Years of education: Not on file  . Highest education level: Not on file  Occupational History  . Occupation: disbaled  Tobacco Use  . Smoking status: Never Smoker  . Smokeless tobacco: Never Used  Substance and Sexual Activity  . Alcohol use: No  . Drug use: No  . Sexual activity: Never  Other Topics Concern  . Not on file  Social History Narrative  . Not on file   Social Determinants of Health   Financial Resource Strain:   . Difficulty of Paying Living Expenses: Not on file  Food Insecurity:   . Worried About Charity fundraiser in the Last Year: Not on file  . Ran Out of Food in the Last Year: Not on file  Transportation Needs:   . Lack of Transportation (Medical): Not on file  . Lack of Transportation (Non-Medical): Not on file  Physical Activity:   . Days of Exercise per Week: Not on file  . Minutes of Exercise per Session: Not on file  Stress:   . Feeling of Stress : Not on file  Social Connections:   . Frequency of Communication with Friends and Family: Not on file  . Frequency of Social Gatherings with Friends and Family: Not on  file  . Attends Religious Services: Not on file  . Active Member of Clubs or Organizations: Not on file  . Attends Archivist Meetings: Not on file  . Marital Status: Not on file  Intimate Partner Violence:   . Fear of  Current or Ex-Partner: Not on file  . Emotionally Abused: Not on file  . Physically Abused: Not on file  . Sexually Abused: Not on file    Outpatient Medications Prior to Visit  Medication Sig Dispense Refill  . AMBULATORY NON FORMULARY MEDICATION Medication Name: MIC gastrostomy/bolus feeding tube 24 French Part number 0110-24. #2 and 10 cc lurer lock syringe #2 Dx: 4 Device 2  . bacitracin 500 UNIT/GM ointment Apply 1 application topically 2 (two) times daily. (Patient taking differently: Apply 1 application topically as needed. ) 30 g 1  . clotrimazole-betamethasone (LOTRISONE) cream Apply 1 application topically 2 (two) times daily. (Patient taking differently: Apply 1 application topically as needed. ) 45 g 1  . Control Gel Formula Dressing (DUODERM CGF DRESSING) MISC Apply to affected area every other day or as needed. 30 each 0  . dantrolene (DANTRIUM) 50 MG capsule Take 1 capsule (50 mg total) by mouth 3 (three) times daily. 90 capsule 6  . divalproex (DEPAKOTE SPRINKLE) 125 MG capsule Take 2 capsules in morning, 5 capsules at bedtime (Patient taking differently: 250-625 mg. Take 2 capsules in morning, 5 capsules at bedtime) 210 capsule 4  . Incontinence Supply Disposable (PREVAIL BREEZERS MEDIUM) MISC pkg of 16- size medium 32" to 44"  Breathable cloth-like outer fabric (can't use the plastic outer surgace  Item # [id number] 16 each 6  . LORazepam (ATIVAN) 1 MG tablet Take one tablet in evenings as needed for insomnia. (Patient taking differently: 1.5 mg. Take and one half tablet in evenings for insomnia.) 30 tablet 1  . Misc. Devices (ALL-BODY MASSAGE) MISC 1 Units/hr by Does not apply route as needed. Full body massage for Muscle spasticity due to  Cerebral palsy 99 each 99  . mupirocin ointment (BACTROBAN) 2 % Apply to area thin film twice daily if needed (Patient taking differently: as needed. Apply to area thin film twice daily if needed) 22 g 0  . NON FORMULARY Bard Leg Bag Extension tubing w/Connector 18", Sterile, latex-free  Item# [id number]    . NON FORMULARY Colorplast Freedom Cath Latex Self-Adhering Male External Catheter 31mm Diameter Intermediate  Item# [id number]    . NONFORMULARY OR COMPOUNDED ITEM [employer] REF [id number] - [place] Joey Pump Set with Flush Bags - 1000 mL 1 each 0  . Nutritional Supplements (FEEDING SUPPLEMENT, KATE FARMS STANDARD 1.4,) LIQD liquid Take 325 mLs by mouth as directed. 3 carton over 16 hrs    . OLANZapine (ZYPREXA) 2.5 MG tablet Place 5 mg into feeding tube 2 (two) times daily. 2.5 mg in the am and 5 mg BID  3  . Omeprazole-Sodium Bicarbonate (ZEGERID) 20-1100 MG CAPS capsule Take 1 capsule by mouth daily before breakfast.    . Ostomy Supplies (PROTECTIVE BARRIER WIPES) MISC 1-1/4" X 3"  Item #[id number] 75 each 6  . PARoxetine (PAXIL) 10 MG tablet Take 15 mg by mouth daily.    . sucralfate (CARAFATE) 1 g tablet TAKE 1 TABLET BY MOUTH 4 TIMES DAILY - WITH MEALS AND AT BEDTIME. (Patient taking differently: as needed. ) 120 tablet 1  . ciprofloxacin (CIPRO) 250 MG tablet 1 tab via peg tube twice a day 20 tablet 0   No facility-administered medications prior to visit.    Allergies  Allergen Reactions  . Ambien [Zolpidem Tartrate] Nausea Only  . Antihistamines, Chlorpheniramine-Type     Other reaction(s): Other (See Comments) Other Reaction: agitation  . Augmentin [Amoxicillin-Pot Clavulanate] Diarrhea  . Codeine Other (See Comments)    Makes  patient too active after a few days.  . Metoclopramide Other (See Comments)    Delusion, emotionality   . Baclofen Anxiety  . Pheniramine Rash    Other reaction(s): Other (See Comments) Other Reaction: agitation  . Sulfa Antibiotics Rash  . Sulfonamide  Derivatives Rash    Review of Systems  Constitutional: Negative for fever and malaise/fatigue.  HENT: Negative for congestion.   Eyes: Negative for blurred vision.  Respiratory: Positive for cough. Negative for shortness of breath.   Cardiovascular: Positive for palpitations. Negative for chest pain and leg swelling.  Gastrointestinal: Negative for abdominal pain, blood in stool and nausea.  Genitourinary: Negative for dysuria and frequency.  Musculoskeletal: Positive for back pain. Negative for falls.  Skin: Negative for rash.  Neurological: Negative for dizziness, loss of consciousness and headaches.  Endo/Heme/Allergies: Negative for environmental allergies.  Psychiatric/Behavioral: Negative for depression. The patient is not nervous/anxious.        Objective:    Physical Exam Vitals and nursing note reviewed.  Constitutional:      General: He is not in acute distress.    Appearance: He is well-developed.     Comments: In WC  HENT:     Head: Normocephalic and atraumatic.     Nose: Nose normal.  Eyes:     General:        Right eye: No discharge.        Left eye: No discharge.  Cardiovascular:     Rate and Rhythm: Normal rate and regular rhythm.     Heart sounds: No murmur heard.   Pulmonary:     Effort: Pulmonary effort is normal.     Breath sounds: Normal breath sounds.  Abdominal:     General: Bowel sounds are normal.     Palpations: Abdomen is soft.     Tenderness: There is no abdominal tenderness.  Musculoskeletal:     Cervical back: Normal range of motion and neck supple.  Skin:    General: Skin is warm and dry.  Neurological:     Mental Status: He is alert and oriented to person, place, and time.     Coordination: Coordination normal.     Comments: Spastic Tetraplegia     BP 110/62   Pulse 95   Temp 98.4 F (36.9 C) (Temporal)   SpO2 97%  Wt Readings from Last 3 Encounters:  05/28/20 (!) 92 lb 3.2 oz (41.8 kg)  03/28/20 92 lb (41.7 kg)  08/30/19  105 lb (47.6 kg)    Diabetic Foot Exam - Simple   No data filed     Lab Results  Component Value Date   WBC 4.5 05/28/2020   HGB 14.8 05/28/2020   HCT 43.7 05/28/2020   PLT 171.0 05/28/2020   GLUCOSE 83 05/28/2020   CHOL 136 02/13/2020   TRIG 76.0 02/13/2020   HDL 31.80 (L) 02/13/2020   LDLCALC 89 02/13/2020   ALT 19 05/28/2020   AST 20 05/28/2020   NA 137 05/28/2020   K 4.1 05/28/2020   CL 103 05/28/2020   CREATININE 0.30 (L) 05/28/2020   BUN 9 05/28/2020   CO2 26 05/28/2020   TSH 1.61 02/13/2020   HGBA1C 5.0 04/29/2007    Lab Results  Component Value Date   TSH 1.61 02/13/2020   Lab Results  Component Value Date   WBC 4.5 05/28/2020   HGB 14.8 05/28/2020   HCT 43.7 05/28/2020   MCV 94.7 05/28/2020   PLT 171.0 05/28/2020   Lab Results  Component Value Date   NA 137 05/28/2020   K 4.1 05/28/2020   CO2 26 05/28/2020   GLUCOSE 83 05/28/2020   BUN 9 05/28/2020   CREATININE 0.30 (L) 05/28/2020   BILITOT 0.4 05/28/2020   ALKPHOS 70 05/28/2020   AST 20 05/28/2020   ALT 19 05/28/2020   PROT 6.9 05/28/2020   ALBUMIN 4.2 05/28/2020   CALCIUM 9.0 05/28/2020   ANIONGAP 11 09/18/2014   GFR 338.86 05/28/2020   Lab Results  Component Value Date   CHOL 136 02/13/2020   Lab Results  Component Value Date   HDL 31.80 (L) 02/13/2020   Lab Results  Component Value Date   LDLCALC 89 02/13/2020   Lab Results  Component Value Date   TRIG 76.0 02/13/2020   Lab Results  Component Value Date   CHOLHDL 4 02/13/2020   Lab Results  Component Value Date   HGBA1C 5.0 04/29/2007       Assessment & Plan:   Problem List Items Addressed This Visit    PALPITATIONS    Had one limited episode of this but he has done well since then. It was about 10 days ago when his pulse and respiratory both spiked.       Dysphagia, oropharyngeal phase    Continues to be NPO and is fed through his feeding tube.       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 The newer ones are shorter and turn tighter so they will fit in his home better. He has been evaluated by Guido Sander at [facility] and by Lucita [facility] of [facility] out of [place]. The posterior cushion on his wheelchair has worn a way and is hard and uncomfortable.        Other Visit Diagnoses    Encounter for long-term (current) use of high-risk medication    -  Primary   Relevant Orders   CBC with Differential/Platelet   Comprehensive metabolic panel   Lipid panel   TSH   Valproic Acid level   Need for influenza vaccination       Relevant Orders   Flu Vaccine QUAD 36+ mos IM (Fluarix, Fluzone & Afluria Quad PF (Completed)      I have discontinued Lajuana Matte "[name]"'s ciprofloxacin. I am also having him maintain his divalproex, Omeprazole-Sodium Bicarbonate, NON FORMULARY, NON FORMULARY, Protective Barrier Wipes, Prevail Breezers Medium, bacitracin, NONFORMULARY OR COMPOUNDED ITEM, LORazepam, PARoxetine, AMBULATORY NON FORMULARY MEDICATION, OLANZapine, mupirocin ointment, All-Body Massage, sucralfate, clotrimazole-betamethasone, dantrolene, feeding supplement (KATE FARMS STANDARD 1.4), and DuoDERM CGF Dressing.  No orders of the defined types were placed in this encounter.    [name], MD

## 2020-09-02 NOTE — Assessment & Plan Note (Signed)
Continues to be NPO and is fed through his feeding tube.

## 2020-09-02 NOTE — Assessment & Plan Note (Signed)
It has been more than 5 years since he got his last power wheelchair and they are in need of a new one. The newer ones are shorter and turn tighter so they will fit in his home better. He has been evaluated by Guido Sander at [facility] and by Lucita Ferrara of [employer] out of [place]. The posterior cushion on his wheelchair has worn a way and is hard and uncomfortable.

## 2020-09-02 NOTE — Assessment & Plan Note (Signed)
Had one limited episode of this but he has done well since then. It was about 10 days ago when his pulse and respiratory both spiked.

## 2020-09-05 NOTE — Addendum Note (Signed)
Addended by: Kelle Darting A on: 09/05/2020 02:22 PM   Modules accepted: Orders

## 2020-09-06 ENCOUNTER — Other Ambulatory Visit (HOSPITAL_COMMUNITY): Payer: Self-pay | Admitting: Student

## 2020-09-06 DIAGNOSIS — R633 Feeding difficulties, unspecified: Secondary | ICD-10-CM

## 2020-09-09 ENCOUNTER — Other Ambulatory Visit: Payer: Self-pay

## 2020-09-09 ENCOUNTER — Other Ambulatory Visit (INDEPENDENT_AMBULATORY_CARE_PROVIDER_SITE_OTHER): Payer: Medicare Other

## 2020-09-09 DIAGNOSIS — Z79899 Other long term (current) drug therapy: Secondary | ICD-10-CM

## 2020-09-09 LAB — COMPREHENSIVE METABOLIC PANEL
ALT: 20 U/L (ref 0–53)
AST: 18 U/L (ref 0–37)
Albumin: 4.6 g/dL (ref 3.5–5.2)
Alkaline Phosphatase: 79 U/L (ref 39–117)
BUN: 8 mg/dL (ref 6–23)
CO2: 29 mEq/L (ref 19–32)
Calcium: 9.3 mg/dL (ref 8.4–10.5)
Chloride: 102 mEq/L (ref 96–112)
Creatinine, Ser: 0.29 mg/dL — ABNORMAL LOW (ref 0.40–1.50)
GFR: 154.71 mL/min (ref >60.00)
Glucose, Bld: 80 mg/dL (ref 70–99)
Potassium: 3.9 mEq/L (ref 3.5–5.1)
Sodium: 141 mEq/L (ref 135–145)
Total Bilirubin: 0.4 mg/dL (ref 0.2–1.2)
Total Protein: 7.2 g/dL (ref 6.0–8.3)

## 2020-09-09 LAB — TSH: TSH: 2.14 u[IU]/mL (ref 0.35–4.50)

## 2020-09-09 LAB — CBC WITH DIFFERENTIAL/PLATELET
Basophils Absolute: 0 10*3/uL (ref 0.0–0.1)
Basophils Relative: 0.2 % (ref 0.0–3.0)
Eosinophils Absolute: 0 10*3/uL (ref 0.0–0.7)
Eosinophils Relative: 0.6 % (ref 0.0–5.0)
HCT: 47.6 % (ref 39.0–52.0)
Hemoglobin: 15.9 g/dL (ref 13.0–17.0)
Lymphocytes Relative: 23.5 % (ref 12.0–46.0)
Lymphs Abs: 1.6 10*3/uL (ref 0.7–4.0)
MCHC: 33.5 g/dL (ref 30.0–36.0)
MCV: 94.5 fl (ref 78.0–100.0)
Monocytes Absolute: 0.3 10*3/uL (ref 0.1–1.0)
Monocytes Relative: 4.6 % (ref 3.0–12.0)
Neutro Abs: 4.8 10*3/uL (ref 1.4–7.7)
Neutrophils Relative %: 71.1 % (ref 43.0–77.0)
Platelets: 185 10*3/uL (ref 150.0–400.0)
RBC: 5.03 Mil/uL (ref 4.22–5.81)
RDW: 12.6 % (ref 11.5–15.5)
WBC: 6.7 10*3/uL (ref 4.0–10.5)

## 2020-09-09 LAB — LIPID PANEL
Cholesterol: 131 mg/dL (ref 0–200)
HDL: 35.8 mg/dL — ABNORMAL LOW (ref >39.00)
LDL Cholesterol: 78 mg/dL (ref 0–99)
NonHDL: 94.89
Total CHOL/HDL Ratio: 4
Triglycerides: 84 mg/dL (ref 0.0–149.0)
VLDL: 16.8 mg/dL (ref 0.0–40.0)

## 2020-09-10 ENCOUNTER — Telehealth: Payer: Self-pay

## 2020-09-10 LAB — VALPROIC ACID LEVEL: Valproic Acid Lvl: 57.7 mg/L (ref 50.0–100.0)

## 2020-09-10 NOTE — Telephone Encounter (Signed)
Pt's mother is aware of lab results.

## 2020-09-11 ENCOUNTER — Other Ambulatory Visit: Payer: Self-pay

## 2020-09-11 ENCOUNTER — Encounter: Payer: Medicare Other | Admitting: Physical Medicine & Rehabilitation

## 2020-09-11 ENCOUNTER — Ambulatory Visit (HOSPITAL_COMMUNITY)
Admission: RE | Admit: 2020-09-11 | Discharge: 2020-09-11 | Disposition: A | Discharge status: Home / Self Care | Payer: Medicare Other | Source: Ambulatory Visit | Attending: Student | Admitting: Student

## 2020-09-11 DIAGNOSIS — Z431 Encounter for attention to gastrostomy: Secondary | ICD-10-CM | POA: Diagnosis not present

## 2020-09-11 DIAGNOSIS — R633 Feeding difficulties, unspecified: Secondary | ICD-10-CM

## 2020-09-11 DIAGNOSIS — R1310 Dysphagia, unspecified: Secondary | ICD-10-CM | POA: Insufficient documentation

## 2020-09-11 DIAGNOSIS — K9413 Enterostomy malfunction: Secondary | ICD-10-CM | POA: Diagnosis not present

## 2020-09-11 HISTORY — PX: IR GJ TUBE CHANGE: IMG1440

## 2020-09-11 MED ORDER — IOHEXOL 300 MG/ML  SOLN: 50.0000 mL | Freq: Once | INTRAMUSCULAR | Status: DC | PRN

## 2020-09-11 NOTE — Procedures (Signed)
Pre procedural Dx: Dysphagia, poorly functioning feeding tube. Post procedural Dx: Same  Successful fluoroscopic guided replacement of exisitng GJ tube. The feeding tube is ready for immediate use.  EBL: None Complications: None immediate.  Ronny Bacon, MD Pager #: [phone]

## 2020-09-26 IMAGING — CR DG CHEST 2V
2 series · 2 of 2 positions shown · non-contrast
Comparison: Radiograph 12/01/2018, CT 01/16/2015

CLINICAL DATA: Cough. Productive cough for 1 week.

EXAM:
CHEST - 2 VIEW

[x chest ap]
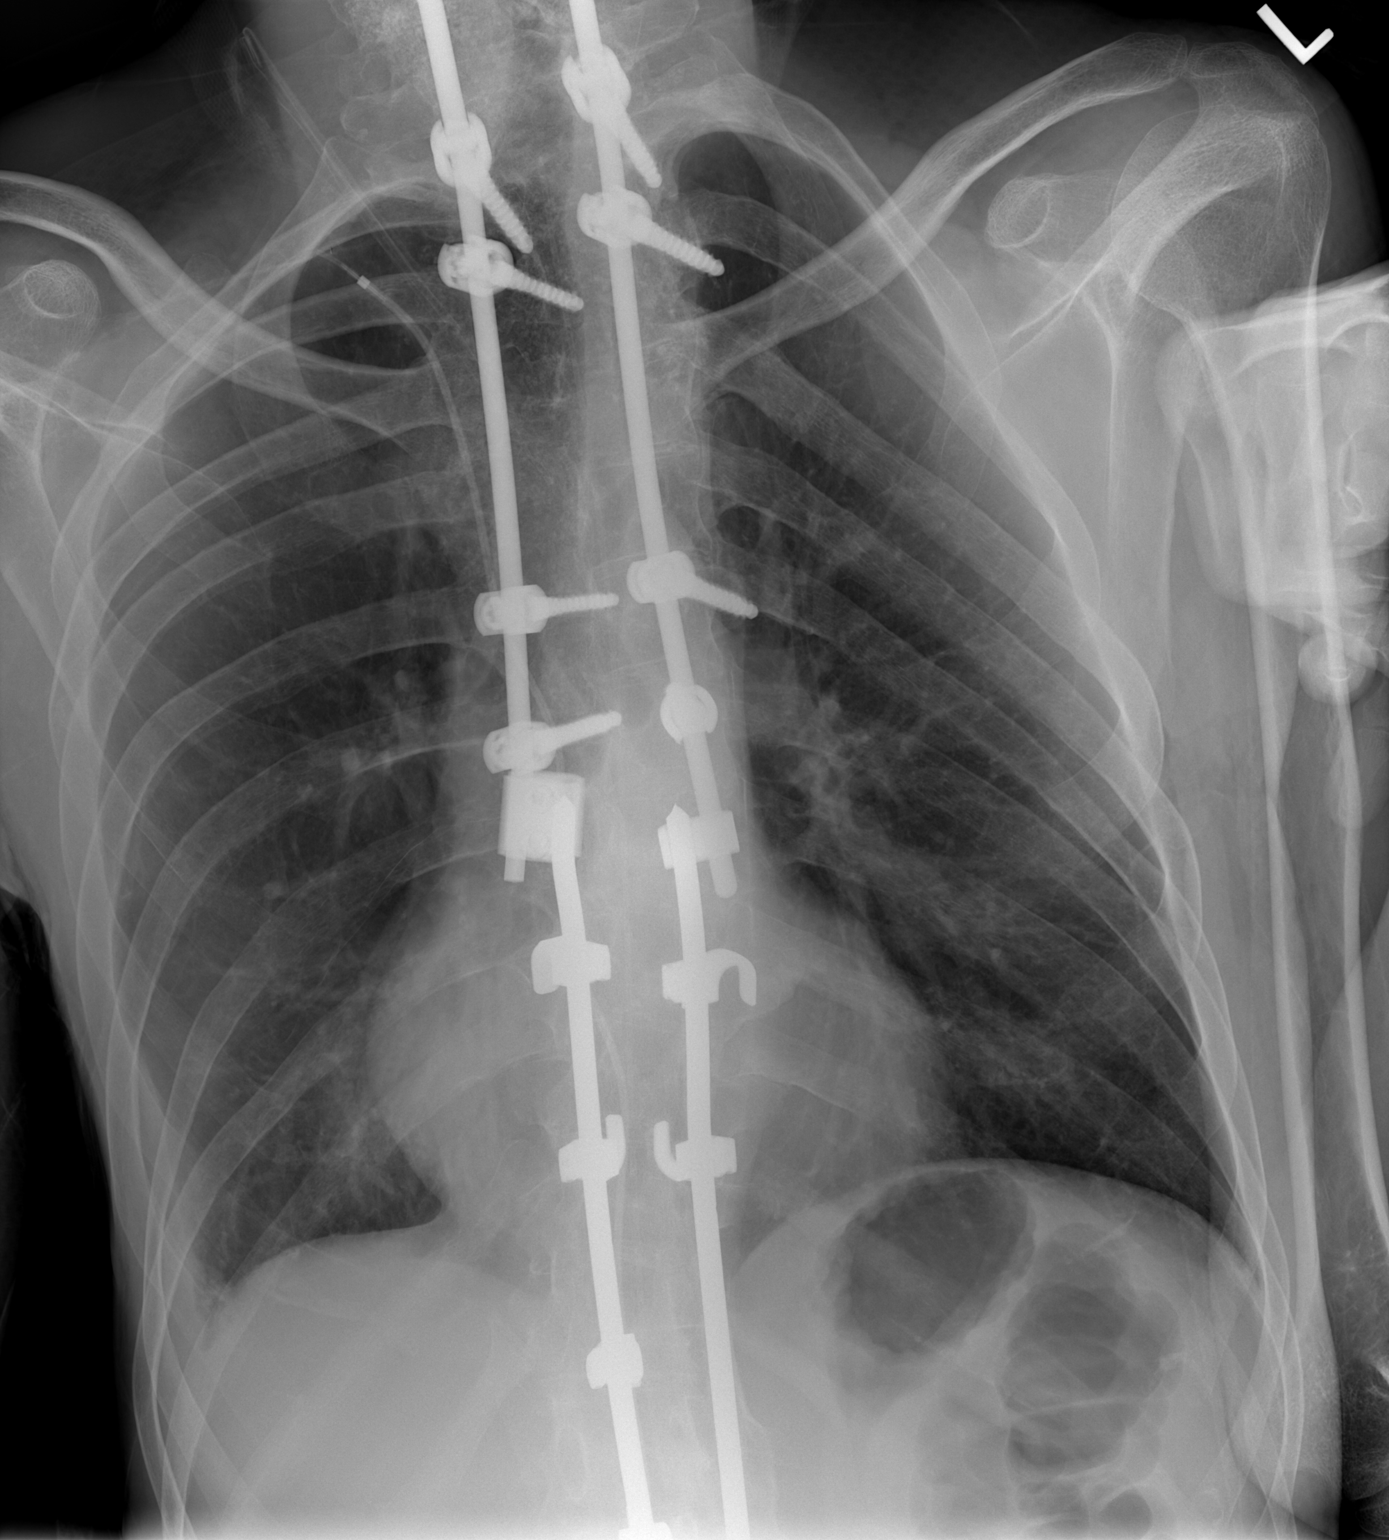

[w chest lat]
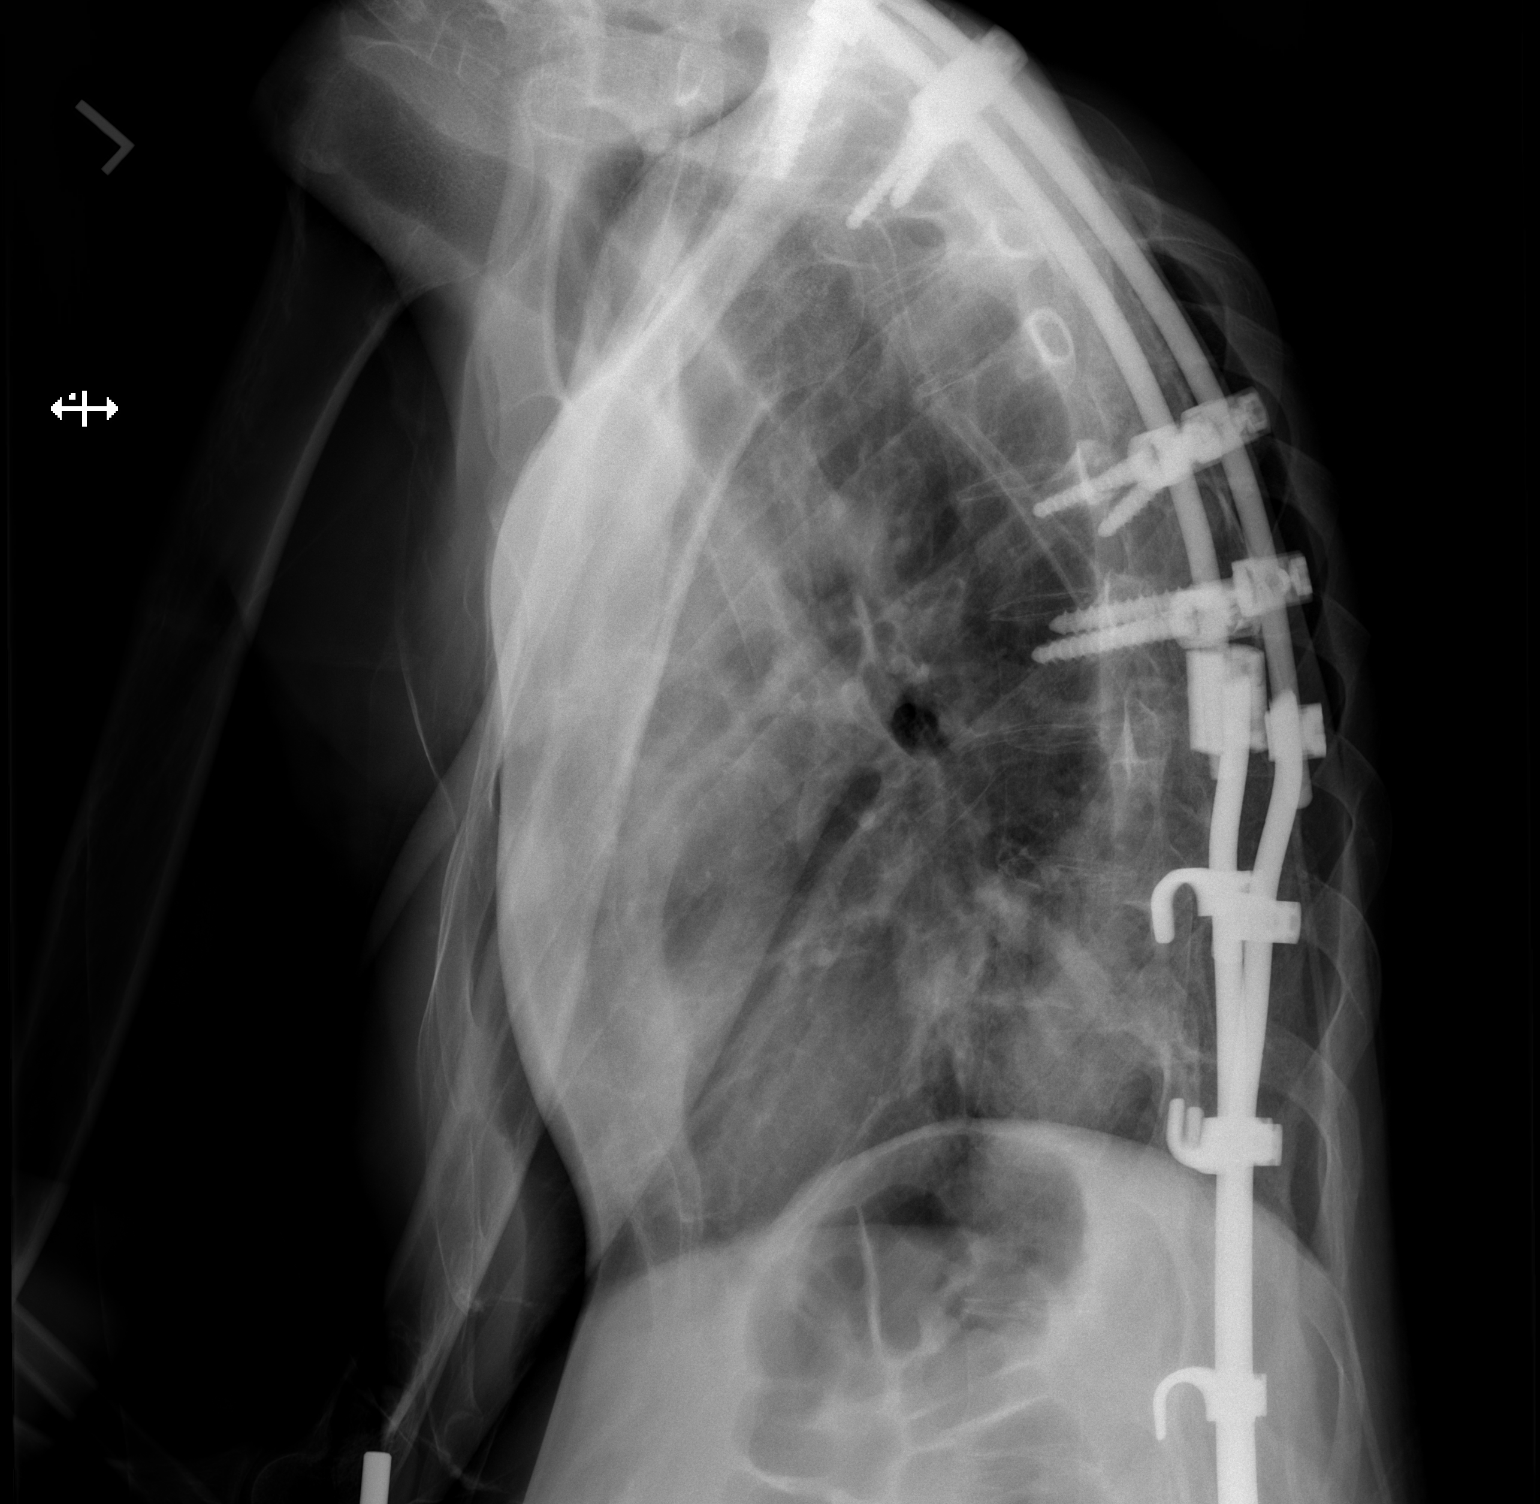

[2 of 2 positions shown; findings below may reference images not displayed]

FINDINGS: Wire/catheter tubing projecting over the right chest is within the
posterior subcutaneous tissues on prior CT. There is a small right
pleural effusion with adjacent airspace disease. Lungs are otherwise
clear. The heart is normal in size. No pulmonary edema or
pneumothorax. Bilateral Harrington rods traverse the included spine.
Bones are under mineralized.
IMPRESSION: Small right pleural effusion with adjacent airspace disease,
atelectasis versus pneumonia. Possibility of aspiration is raised.

## 2020-09-28 DIAGNOSIS — Z23 Encounter for immunization: Secondary | ICD-10-CM | POA: Diagnosis not present

## 2020-10-16 ENCOUNTER — Other Ambulatory Visit: Payer: Self-pay | Admitting: Physical Medicine & Rehabilitation

## 2020-10-16 DIAGNOSIS — G809 Cerebral palsy, unspecified: Secondary | ICD-10-CM

## 2020-10-16 DIAGNOSIS — G8250 Quadriplegia, unspecified: Secondary | ICD-10-CM

## 2020-10-19 ENCOUNTER — Other Ambulatory Visit: Payer: Self-pay

## 2020-10-19 ENCOUNTER — Other Ambulatory Visit: Payer: Self-pay | Admitting: Family Medicine

## 2020-10-19 ENCOUNTER — Ambulatory Visit
Admission: EM | Admit: 2020-10-19 | Discharge: 2020-10-19 | Discharge status: Against Medical Advice | Payer: Medicare Other

## 2020-10-20 ENCOUNTER — Other Ambulatory Visit: Payer: Self-pay

## 2020-10-20 ENCOUNTER — Ambulatory Visit
Admission: RE | Admit: 2020-10-20 | Discharge: 2020-10-20 | Disposition: A | Discharge status: Home / Self Care | Payer: Medicare Other | Source: Ambulatory Visit | Attending: Family Medicine | Admitting: Family Medicine

## 2020-10-20 VITALS — BP 104/59 | HR 110 | Temp 98.1°F | Resp 20

## 2020-10-20 DIAGNOSIS — L02419 Cutaneous abscess of limb, unspecified: Secondary | ICD-10-CM | POA: Diagnosis not present

## 2020-10-20 DIAGNOSIS — L03119 Cellulitis of unspecified part of limb: Secondary | ICD-10-CM | POA: Insufficient documentation

## 2020-10-20 MED ORDER — CLINDAMYCIN HCL 300 MG PO CAPS: 300.0000 mg | ORAL_CAPSULE | Freq: Three times a day (TID) | ORAL | 0 refills | Status: DC

## 2020-10-20 NOTE — ED Provider Notes (Signed)
[facility]    CSN: [id number] Arrival date & time: 10/20/20  0946      History   Chief Complaint Chief Complaint  Patient presents with  . Abscess    Since Tuesday    HPI MARKANTHONY GEDNEY is a 34 y.o. male.   He is presenting with a redness and sore on the right anterior thigh.  Symptoms been ongoing since last Tuesday.  They have gotten progressively worse.  Mother has been trying over-the-counter applications with limited improvement.  Seems to be getting worse.  He has had a abscess on his shin previously.  No injury or trauma.  No fevers or chills  HPI  Past Medical History:  Diagnosis Date  . Cerebral palsy ([place])   . Dehydration 11/22/2013  . Depression with anxiety 08/01/2010   Qualifier: Diagnosis of  By: Nelson-Smith CMA (AAMA), Dottie    . Dyslipidemia 08/19/2017  . Esophagitis 2011  . Gastrostomy in place [facility]) 08/31/2013  . GERD (gastroesophageal reflux disease)   . Hyperlipidemia, mild 08/25/2015  . Hyperthyroidism   . Incontinence of feces   . Loss of weight 08/28/2014  . Medicare annual wellness visit, subsequent 08/25/2015  . Mildly underweight adult 03/16/2017  . Palpitations   . Skin lesion of right ear 03/16/2017  . Thyroid disease 08/01/2010   Qualifier: Diagnosis of  By: Harlon Ditty CMA (AAMA), Dottie      Patient Active Problem List   Diagnosis Date Noted  . Close exposure to COVID-19 virus 10/16/2019  . Mildly underweight adult 03/16/2017  . Skin lesion of right ear 03/16/2017  . Increased oropharyngeal secretions 04/14/2016  . Medicare annual wellness visit, subsequent 08/25/2015  . Hyperlipidemia, mild 08/25/2015  . Chronic GERD 12/17/2014  . CAP (community acquired pneumonia) 12/17/2014  . Rectal bleeding 09/06/2014  . Esophageal dysphagia 09/06/2014  . Dysphagia, pharyngoesophageal phase 09/02/2014  . Impetigo 09/02/2014  . Abnormal thyroid function test 06/10/2014  . Acute nonsuppurative otitis media of right ear  05/13/2014  . Pneumonia 02/27/2014  . Protein-calorie malnutrition, severe ([place]) 11/23/2013  . Decreased oral intake 11/22/2013  . 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 Cervical dystonia 08/23/2013  . Dysphagia, oropharyngeal phase 08/10/2013  . Low back pain 07/11/2013  . 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 Anemia 12/05/2010  . 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 GASTROSTOMY COMPLICATION 69/48/5462  . Infantile cerebral palsy ([place]) 08/04/2010  . Thyroid disease 08/01/2010  . Depression with anxiety 08/01/2010  . GERD 08/01/2010  . OTHER ACNE 10/16/2009  . PALPITATIONS 02/25/2009  . HIP PAIN, BILATERAL 11/06/2008  . NEVI, MULTIPLE 10/09/2008  . Incontinence of feces 04/02/2008  . Urinary incontinence 04/02/2008  . 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 FACIAL RASH 04/29/2007  . PALSY, INFANTILE CEREBRAL, 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    Past Surgical History:  Procedure Laterality Date  . baclofen trial    . baslofen pump implant    . ears tubes    . EYE SURGERY    . FLEXIBLE SIGMOIDOSCOPY N/A 09/07/2014   Procedure: FLEXIBLE SIGMOIDOSCOPY;  Surgeon: [name], MD;  Location: [facility] ENDOSCOPY;  Service: Endoscopy;  Laterality: N/A;  . g-tube insert  August 2006  . hamstring released     to treat contractures.   Marland Kitchen HIP SURGERY     x2 , side   . IR CM INJ ANY COLONIC TUBE W/FLUORO  05/28/2017  . IR CM INJ ANY COLONIC TUBE W/FLUORO  07/07/2019  . IR CM INJ ANY COLONIC TUBE W/FLUORO  09/18/2019  . IR GASTR TUBE CONVERT GASTR-JEJ PER W/FL MOD SED  03/14/2019  . IR GENERIC HISTORICAL  07/01/2016   IR GASTR TUBE CONVERT GASTR-JEJ PER W/FL MOD SED 07/01/2016 [name], MD [facility]  . IR GENERIC HISTORICAL  07/08/2016   IR PATIENT EVAL TECH 0-60 MINS 07/08/2016 [name], MD [facility]  . IR GENERIC HISTORICAL  07/14/2016   IR GJ TUBE CHANGE 07/14/2016 [name], MD [facility]  . IR GENERIC HISTORICAL  07/21/2016   IR PATIENT EVAL TECH 0-60 MINS [facility]  .  IR GENERIC HISTORICAL  08/31/2016   IR [place] DUODEN/JEJUNO TUBE PERCUT W/FLUORO 08/31/2016 [name], MD [facility]  . IR GENERIC HISTORICAL  09/03/2016   IR GJ TUBE CHANGE 09/03/2016 [name], MD [facility]  . IR GENERIC HISTORICAL  09/10/2016   IR GASTR TUBE CONVERT GASTR-JEJ PER W/FL MOD SED 09/10/2016 [facility]  . IR GENERIC HISTORICAL  09/16/2016   IR PATIENT EVAL TECH 0-60 MINS [facility]  . IR GENERIC HISTORICAL  09/29/2016   IR GJ TUBE CHANGE 09/29/2016 [name], MD [facility]  . IR GJ TUBE CHANGE  02/05/2017  . IR GJ TUBE CHANGE  05/21/2017  . IR GJ TUBE CHANGE  08/25/2017  . IR GJ TUBE CHANGE  01/18/2018  . IR GJ TUBE CHANGE  02/04/2018  . IR GJ TUBE CHANGE  04/21/2018  . IR GJ TUBE CHANGE  08/18/2018  . IR GJ TUBE CHANGE  09/12/2019  . IR GJ TUBE CHANGE  01/03/2020  . IR GJ TUBE CHANGE  05/13/2020  . IR GJ TUBE CHANGE  09/11/2020  . IR MECH REMOV OBSTRUC MAT ANY COLON TUBE W/FLUORO  09/02/2018  . IR REPLC GASTRO/COLONIC TUBE PERCUT W/FLUORO  10/17/2018  . PEG PLACEMENT  10/21/2011   Procedure: PERCUTANEOUS ENDOSCOPIC GASTROSTOMY (PEG) REPLACEMENT;  Surgeon: [name], MD;  Location: [facility] ENDOSCOPY;  Service: Endoscopy;  Laterality: N/A;  . PEG PLACEMENT N/A 06/13/2013   Procedure: PERCUTANEOUS ENDOSCOPIC GASTROSTOMY (PEG) REPLACEMENT;  Surgeon: [name], MD;  Location: [facility] ENDOSCOPY;  Service: Endoscopy;  Laterality: N/A;  . SPINAL FUSION    . spinal fusion to correct 70 degree kyphosis  11-2010  . spinal fusioncorrect 106 degree kyphosis    . SPINE SURGERY  ,11/20/2010, 2011   for correction of severe contracturing spinal kyphosis.   . TONSILLECTOMY         Home Medications    Prior to Admission medications   Medication Sig Start Date End Date Taking? Authorizing Provider  AMBULATORY NON FORMULARY MEDICATION Medication Name: MIC gastrostomy/bolus feeding tube 24 French Part number 0110-24. #2 and 10 cc lurer lock syringe #2 Dx: 10/15/15    Pyrtle, Lajuan Lines, MD  clindamycin (CLEOCIN) 300 MG capsule Take 1 capsule (300 mg total) by mouth 3 (three) times daily. 10/20/20   Rosemarie Ax, MD  Control Gel Formula Dressing (DUODERM CGF DRESSING) MISC Apply to affected area every other day or as needed. 08/15/20   [name], MD  dantrolene (DANTRIUM) 50 MG capsule TAKE 1 CAPSULE BY MOUTH 3 TIMES DAILY. 10/17/20   Meredith Staggers, MD  divalproex (DEPAKOTE SPRINKLE) 125 MG capsule Take 2 capsules in morning, 5 capsules at bedtime Patient taking differently: 250-625 mg. Take 2 capsules in morning, 5 capsules at bedtime 12/11/13   Cameron Sprang, MD  Incontinence Supply Disposable (PREVAIL BREEZERS MEDIUM) MISC pkg of 16- size medium 32" to 44"  Breathable cloth-like outer  fabric (can't use the plastic outer surgace  Item # [id number] 07/30/14   [name], MD  LORazepam (ATIVAN) 1 MG tablet Take one tablet in evenings as needed for insomnia. Patient taking differently: 1.5 mg. Take and one half tablet in evenings for insomnia. 06/04/15   [name], MD  Misc. Devices (ALL-BODY MASSAGE) MISC 1 Units/hr by Does not apply route as needed. Full body massage for Muscle spasticity due to Cerebral palsy 03/14/19   [name], MD  mupirocin ointment (BACTROBAN) 2 % Apply to area thin film twice daily if needed Patient taking differently: as needed. Apply to area thin film twice daily if needed 09/02/16   Saguier, Percell Miller, PA-C  NON FORMULARY Bard Leg Bag Extension tubing w/Connector 18", Sterile, latex-free  Item# [id number]    [provider]  NON FORMULARY Colorplast Freedom Cath Latex Self-Adhering Male External Catheter 38mm Diameter Intermediate  Item# [id number]    [provider]  NONFORMULARY OR COMPOUNDED Suzy Bouchard REF [id number] - [place] Pump Set with Flush Bags - 1000 mL 06/04/15   [name], MD  Nutritional Supplements (FEEDING SUPPLEMENT, KATE FARMS STANDARD 1.4,) LIQD liquid Take 325 mLs by  mouth as directed. 3 carton over 16 hrs    [provider]  OLANZapine (ZYPREXA) 2.5 MG tablet Place 5 mg into feeding tube 2 (two) times daily. 2.5 mg in the am and 5 mg BID 03/26/16   [provider]  Omeprazole-Sodium Bicarbonate (ZEGERID) 20-1100 MG CAPS capsule Take 1 capsule by mouth daily before breakfast.    [provider]  Ostomy Supplies (PROTECTIVE BARRIER WIPES) [place] 1-1/4" X 3"  Item #[id number] 07/30/14   [name], MD  PARoxetine (PAXIL) 10 MG tablet Take 15 mg by mouth daily.    [provider]  sucralfate (CARAFATE) 1 g tablet TAKE 1 TABLET BY MOUTH 4 TIMES DAILY - WITH MEALS AND AT BEDTIME. Patient taking differently: as needed.  07/12/19   Pyrtle, Lajuan Lines, MD    Family History Family History  Problem Relation Age of Onset  . Asthma Mother   . Hyperlipidemia Mother   . COPD Mother   . Other Mother        bronchial stasis/ABPA  . Cancer Maternal Grandmother 21       breast  . Hyperlipidemia Maternal Grandmother   . Hypertension Maternal Grandmother   . Cancer Maternal Grandfather        prostate  . Heart disease Paternal Grandfather        CHF  . Osteoporosis Paternal Grandmother   . Arthritis Paternal Grandmother        rheumatoid    Social History Social History   Tobacco Use  . Smoking status: Never Smoker  . Smokeless tobacco: Never Used  Vaping Use  . Vaping Use: Never used  Substance Use Topics  . Alcohol use: No  . Drug use: No     Allergies   Ambien [zolpidem tartrate]; Antihistamines, chlorpheniramine-type; Augmentin [amoxicillin-pot clavulanate]; Codeine; Metoclopramide; Baclofen; Pheniramine; Sulfa antibiotics; and Sulfonamide derivatives   Review of Systems Review of Systems  See HPI  Physical Exam Triage Vital Signs ED Triage Vitals [10/20/20 1008]  Enc Vitals Group     BP (!) 104/59     Pulse Rate (!) 110     Resp 20     Temp 98.1 F (36.7 C)     Temp Source Oral     SpO2 96 %  Weight       Height      Head Circumference      Peak Flow      Pain Score      Pain Loc      Pain Edu?      Excl. in [place]?    No data found.  Updated Vital Signs BP (!) 104/59 (BP Location: Left Arm)   Pulse (!) 110   Temp 98.1 F (36.7 C) (Oral)   Resp 20   SpO2 96%   Visual Acuity Right Eye Distance:   Left Eye Distance:   Bilateral Distance:    Right Eye Near:   Left Eye Near:    Bilateral Near:     Physical Exam Gen: NAD, alert, cooperative with exam, ENT: normal lips, normal nasal mucosa,  Skin: Fluctuation and induration of an area on the right anterior mid thigh with no streaking and no active discharge  Incision and Drainage Procedure Note:  The affected area was cleaned and draped in a sterile fashion. Anesthesia was achieved using 4 mL of 2% Lidocaine without epinephrine injected around the wound area using a 25-guage 1.5 inch needle. An 11-blade scalpel was used to incise the wound. A culture was obtained. A hemostat was used to break any loculations that were present. Iodoform guaze was used to pack the wound. A sterile dressing was applied to the area. The patient tolerated the procedure well. No complications were encountered.    UC Treatments / Results  Labs (all labs ordered are listed, but only abnormal results are displayed) Labs Reviewed  AEROBIC CULTURE (SUPERFICIAL SPECIMEN)    EKG   Radiology No results found.  Procedures Procedures (including critical care time)  Medications Ordered in UC Medications - No data to display  Initial Impression / Assessment and Plan / UC Course  I have reviewed the triage vital signs and the nursing notes.  Pertinent labs & imaging results that were available during my care of the patient were reviewed by me and considered in my medical decision making (see chart for details).     Mr. Jeffries is a 34 year old male that is presenting with abscess and cellulitis.  Incision and drainage was achieved today.  Wound  culture was sent.  He was provided clindamycin and counseled on supportive care.  Given indications on follow-up.  Final Clinical Impressions(s) / UC Diagnoses   Final diagnoses:  Cellulitis and abscess of leg     Discharge Instructions     Please change the bandages when dirty  Please try soap and water  Please follow up if your symptoms fail to improve.     ED Prescriptions    Medication Sig Dispense Auth. Provider   clindamycin (CLEOCIN) 300 MG capsule Take 1 capsule (300 mg total) by mouth 3 (three) times daily. 21 capsule Rosemarie Ax, MD     PDMP not reviewed this encounter.   Rosemarie Ax, MD 10/20/20 1114

## 2020-10-20 NOTE — Discharge Instructions (Signed)
Please change the bandages when dirty  Please try soap and water  Please follow up if your symptoms fail to improve.

## 2020-10-20 NOTE — ED Triage Notes (Signed)
Parent states wound has been present since Monday or Tuesday. Pt has had the wound worsening. Pt is ao and ambulates at baseline due to chronic medical conditions.

## 2020-10-23 ENCOUNTER — Telehealth: Payer: Self-pay | Admitting: Family Medicine

## 2020-10-23 LAB — AEROBIC CULTURE W GRAM STAIN (SUPERFICIAL SPECIMEN)

## 2020-10-23 NOTE — Telephone Encounter (Signed)
Discussed with patient's mother about his symptoms. Will try clindamycin BID. If he is still symptomatic will try decreasing dose or he may need to be evaluated.   Rosemarie Ax, MD [facility] 10/23/2020, 2:27 PM

## 2020-10-23 NOTE — Telephone Encounter (Signed)
Informed of results.   Rosemarie Ax, MD [facility] 10/23/2020, 8:51 AM

## 2020-10-23 NOTE — Telephone Encounter (Signed)
Pt's mom called states he is very lethargic & is suffering w/ nausea & diarrhea possibly due to the type of antibiotic.  ---Parent request a callback w/ advise or to discuss.  --Forwarding message to Dr.Schmitz to call pt's mom.  --glh

## 2020-11-01 IMAGING — DX DG CHEST 2V
2 series · 2 of 2 positions shown · non-contrast
Comparison: 02/21/2020 chest radiograph.

CLINICAL DATA: Chest pain, right pleural effusion, chronic cough

EXAM:
CHEST - 2 VIEW

[chest lat]
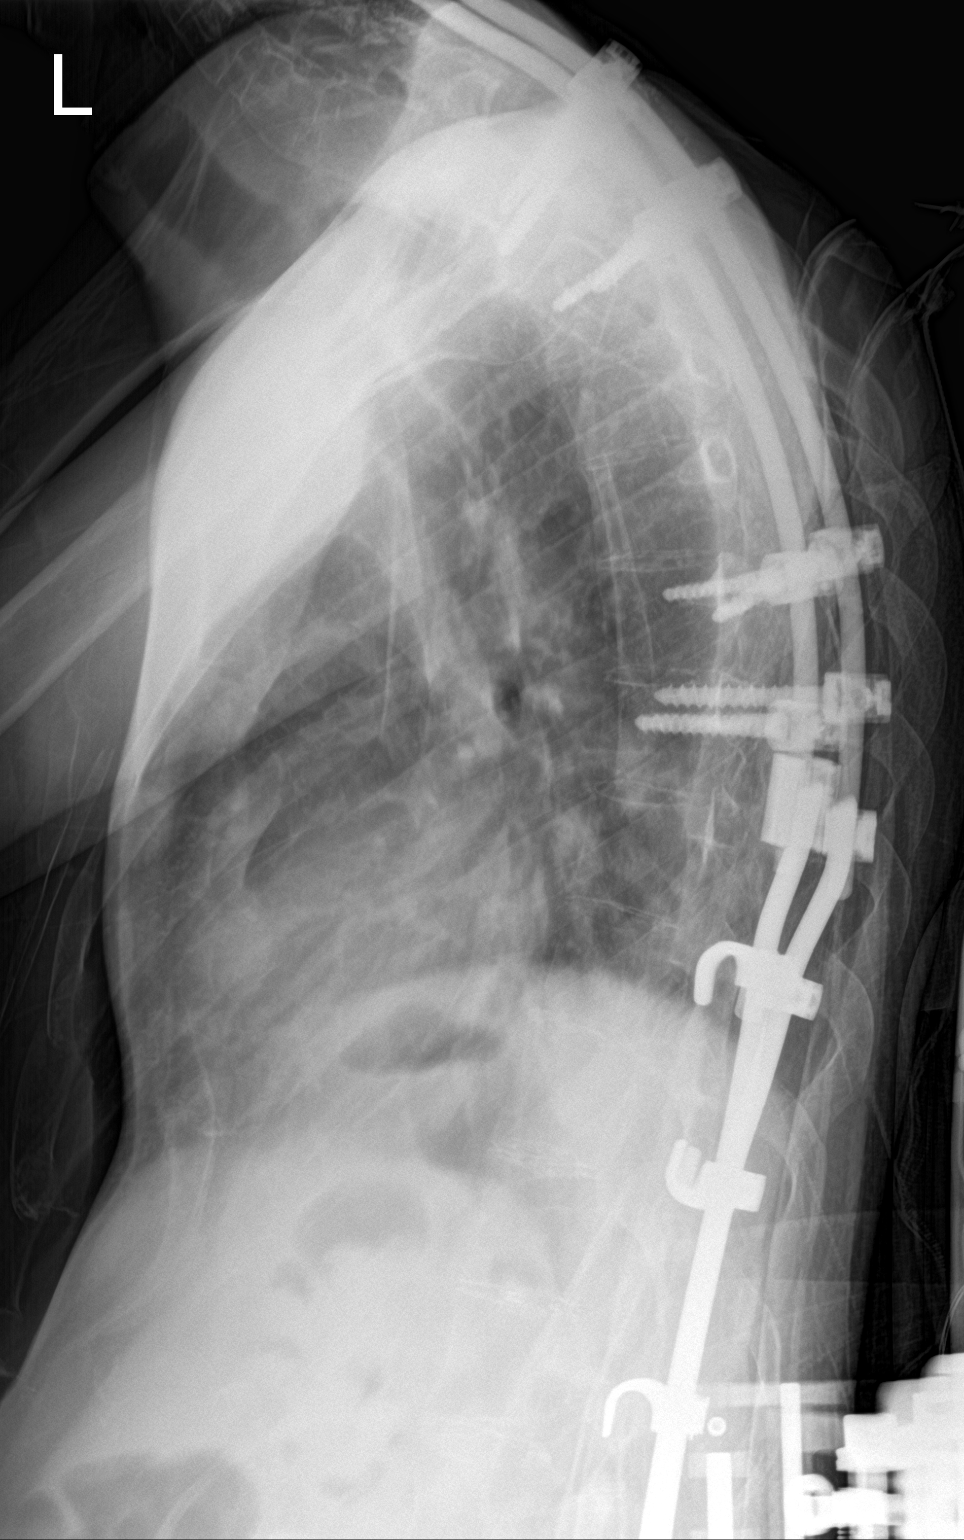

[chest ap]
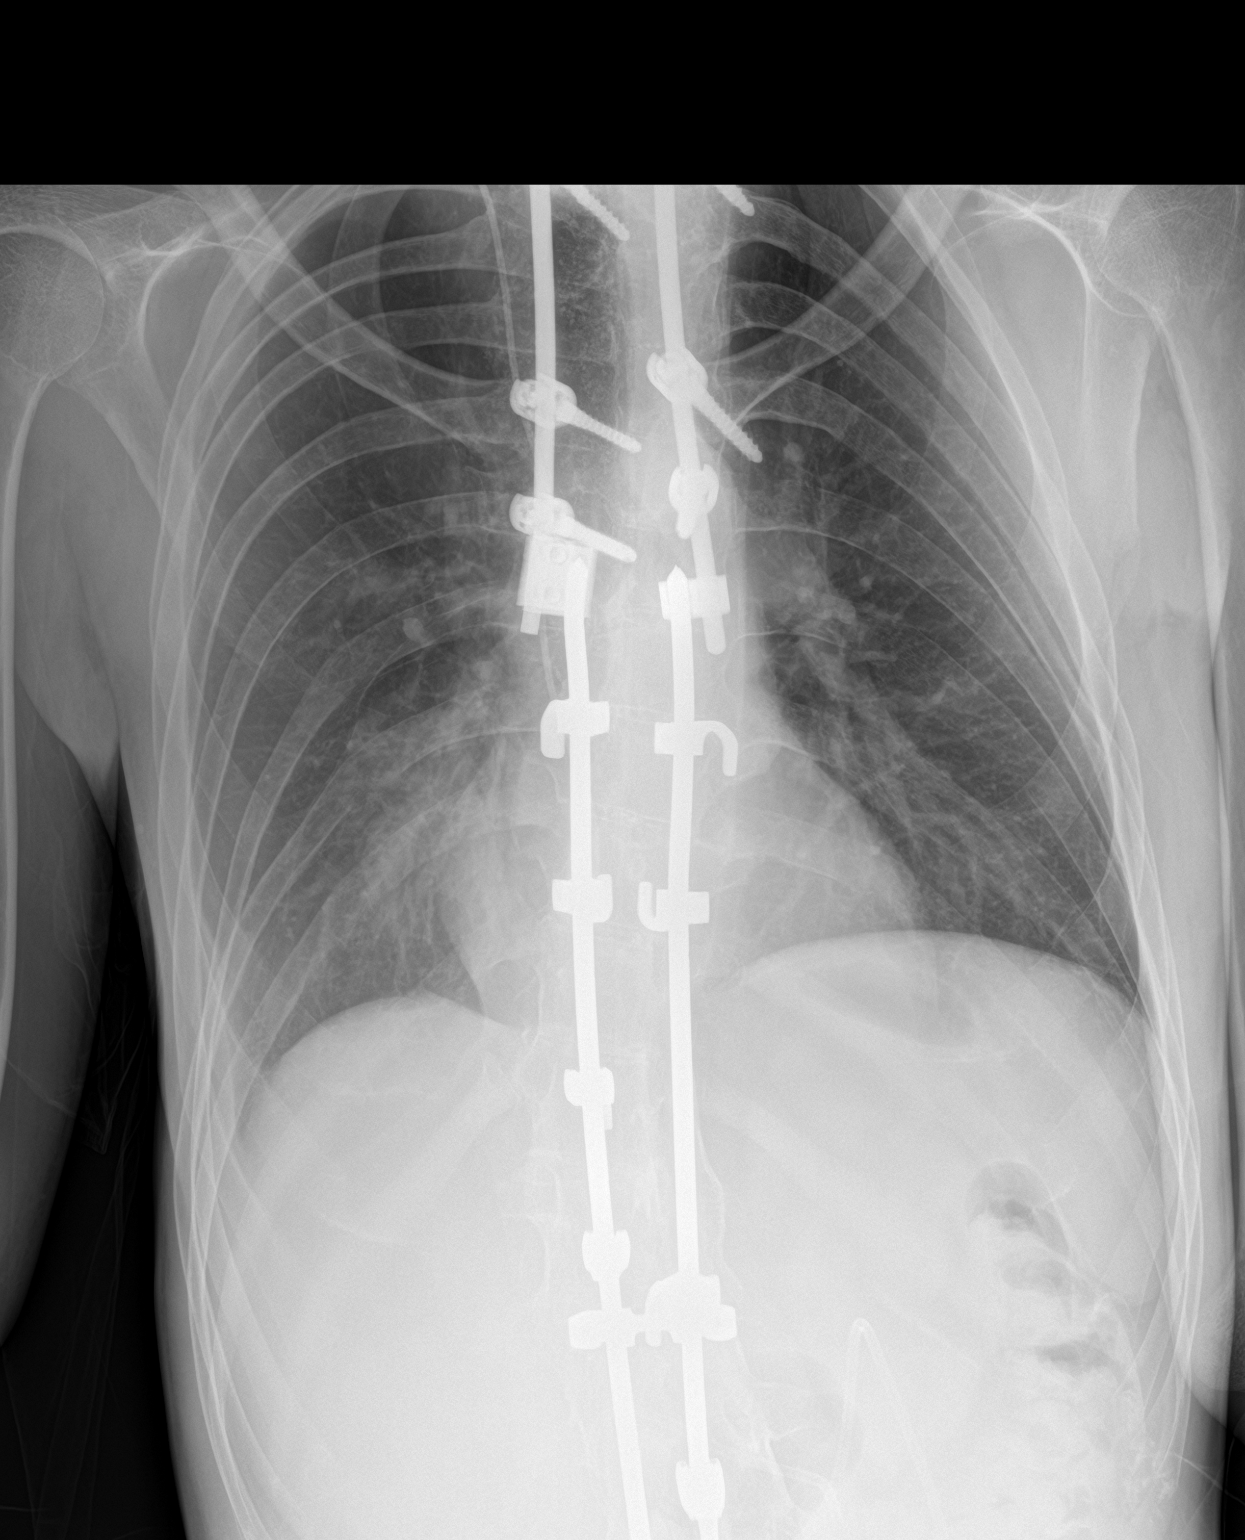

[2 of 2 positions shown; findings below may reference images not displayed]

FINDINGS: Vertically oriented catheter overlying the medial right chest is
unchanged. Bilateral posterior spinal fusion hardware is partially
visualized. Stable cardiomediastinal silhouette with normal heart
size. No pneumothorax. Stable mild blunting of the right
costophrenic angle. No left pleural effusion. No pulmonary edema.
Mild patchy right middle lobe opacity partially obscuring the right
heart border, new.
IMPRESSION: 1. Mild patchy right middle lobe opacity, new, suspicious for
pneumonia or aspiration. Chest radiograph follow-up advised.
2. Stable mild blunting of the right costophrenic angle, either
small right pleural effusion or pleuroparenchymal scarring.

## 2020-11-06 DIAGNOSIS — F339 Major depressive disorder, recurrent, unspecified: Secondary | ICD-10-CM | POA: Diagnosis not present

## 2020-11-13 ENCOUNTER — Encounter: Payer: Medicare Other | Admitting: Physical Medicine & Rehabilitation

## 2020-11-19 IMAGING — DX DG CHEST 2V
2 series · 2 of 2 positions shown · non-contrast
Comparison: 03/28/2020

CLINICAL DATA: Follow-up pneumonia.

EXAM:
CHEST - 2 VIEW

[chest lat]
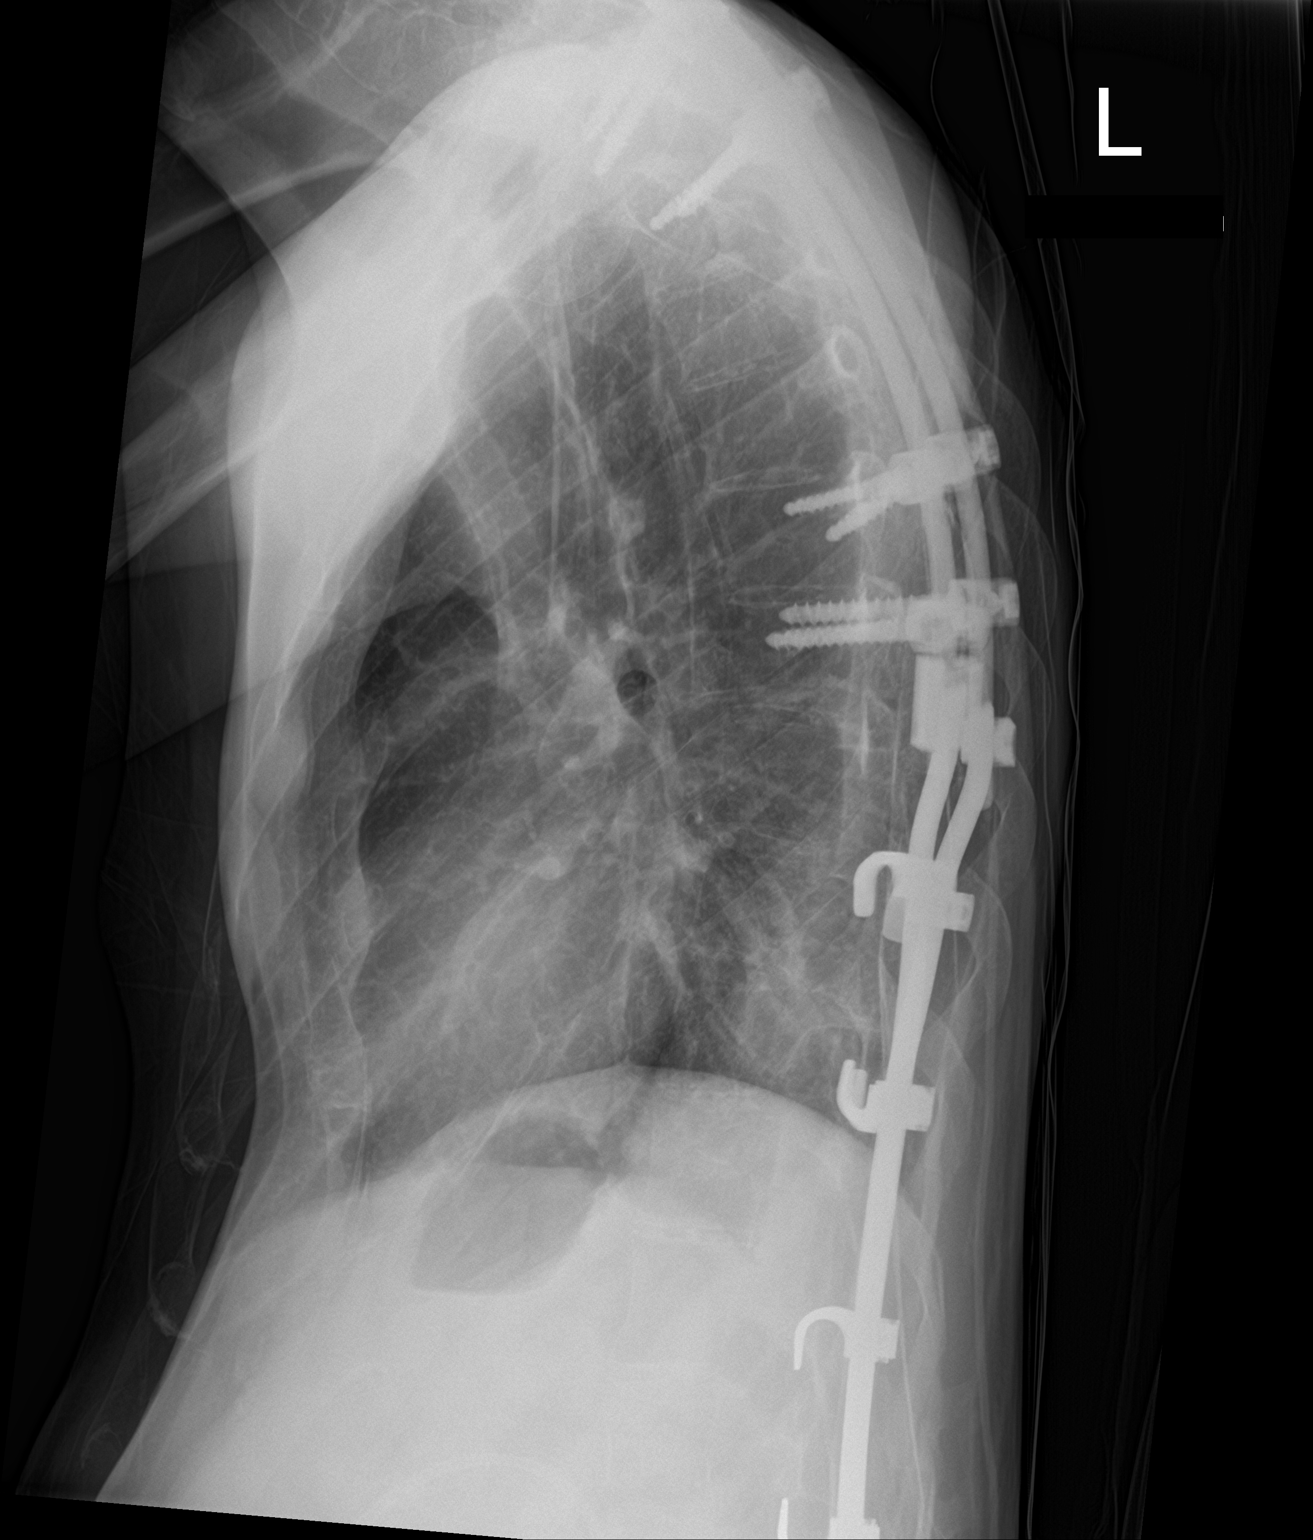

[chest ap]
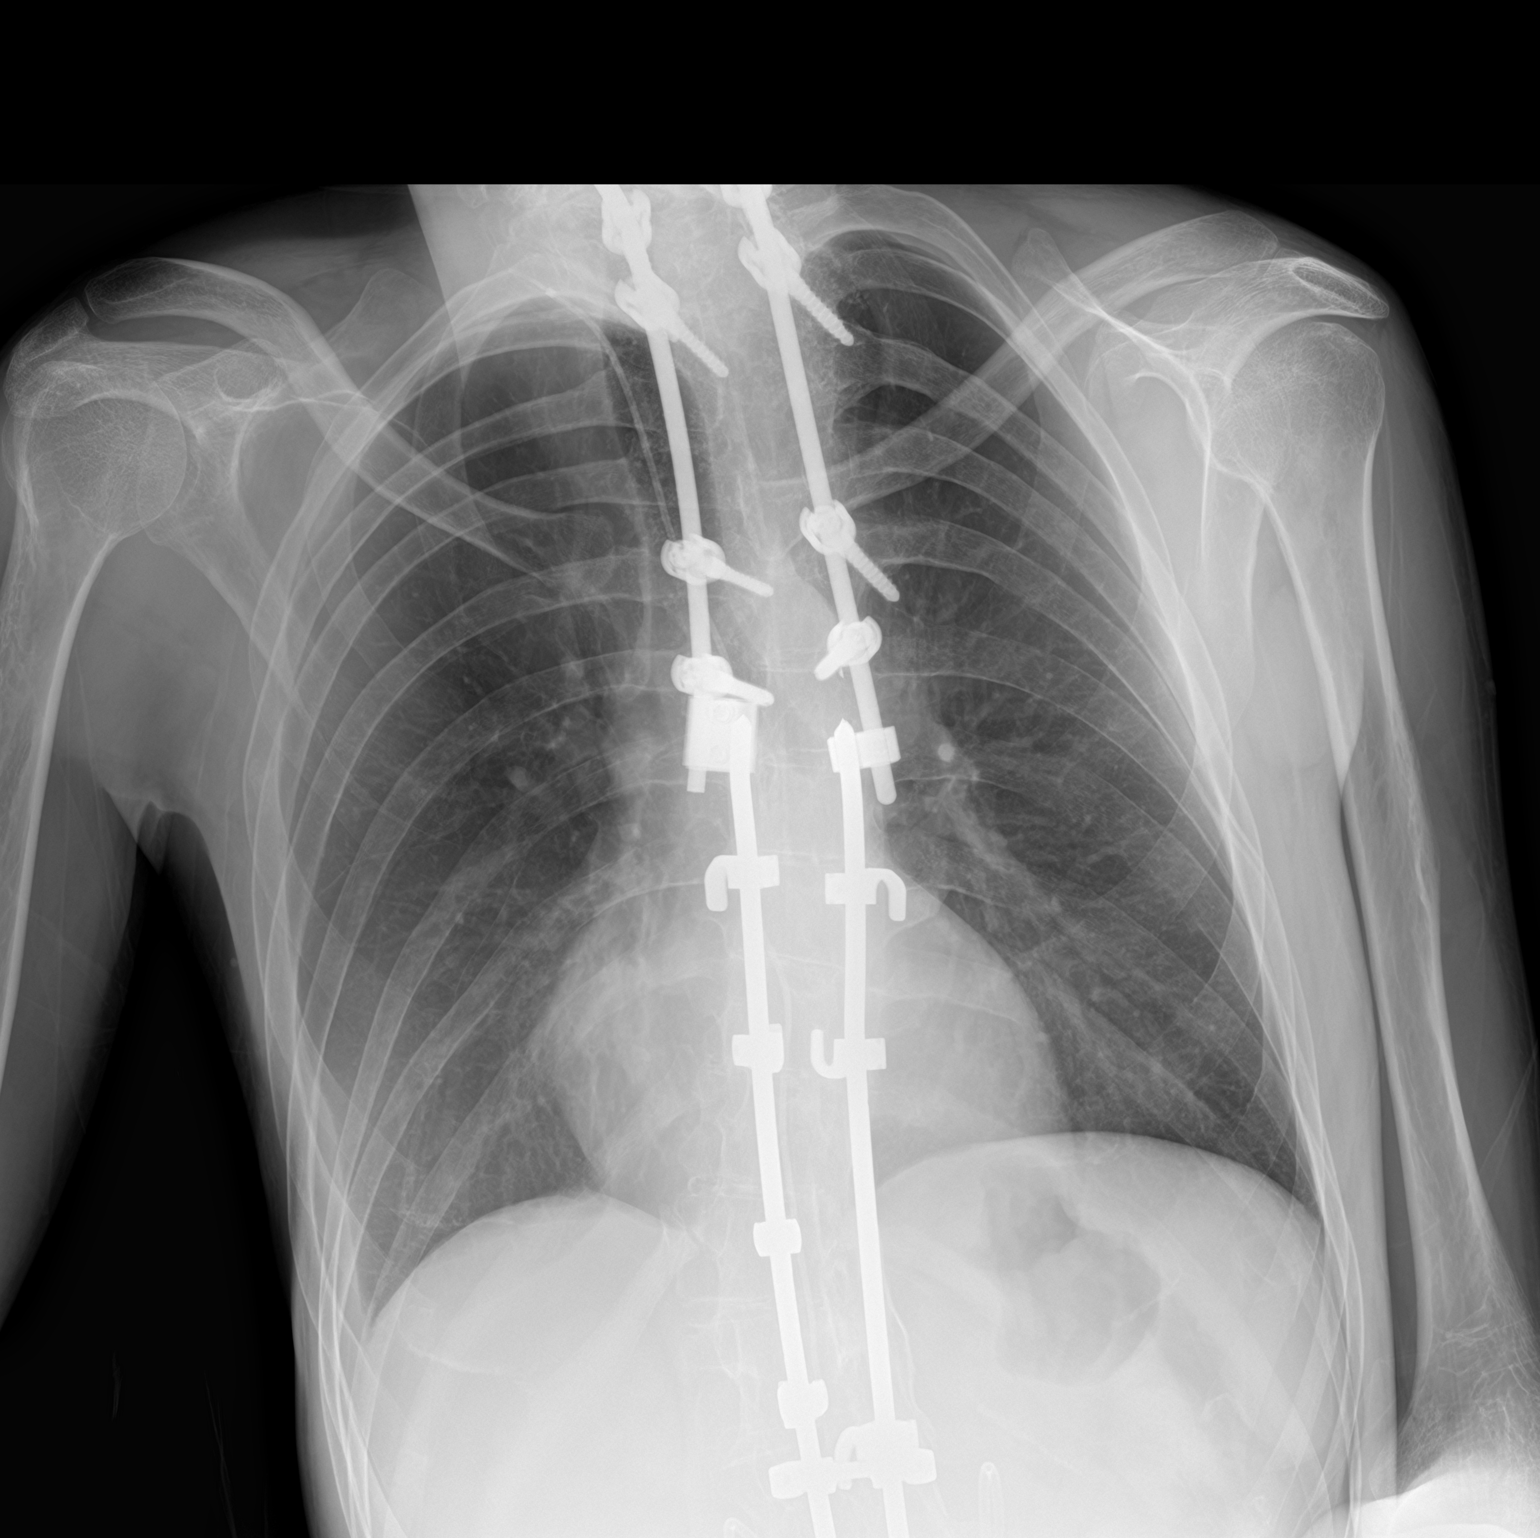

[2 of 2 positions shown; findings below may reference images not displayed]

FINDINGS: The heart size and mediastinal contours are within normal limits.
There has been resolution right middle lobe opacity along the right
heart border since previous study. Both lungs appear clear. No
evidence of pleural effusion. Posterior spinal fixation rods are
again seen.
IMPRESSION: Resolution of right middle lobe opacity since prior study. No active
cardiopulmonary disease.

## 2020-12-03 ENCOUNTER — Other Ambulatory Visit: Payer: Self-pay

## 2020-12-03 ENCOUNTER — Telehealth (INDEPENDENT_AMBULATORY_CARE_PROVIDER_SITE_OTHER): Payer: Medicare Other | Admitting: Family Medicine

## 2020-12-03 DIAGNOSIS — G8250 Quadriplegia, unspecified: Secondary | ICD-10-CM

## 2020-12-04 ENCOUNTER — Encounter: Payer: Self-pay | Admitting: Family Medicine

## 2020-12-04 NOTE — Assessment & Plan Note (Signed)
Patient and his mother are currently working with Dr. Naaman Plummer his physiatrist to obtain a new power wheelchair which he is dependent upon.  He requires total care for all activities of daily living.  He is unable to speak clearly due to his ongoing debility and thus has been dependent on a DynaVox since he was young.  He is currently in need of a new one as his 1 is very old and is unable to be updated.  He will also require a mount for his power wheelchair to help him utilize his DynaVox appropriately.  He requires the DynaVox to support him in all of his environments including at home, at physical therapy and more.  He has very limited fine motor skills and as a result requires an adaptive switch on his DynaVox to be located behind his left elbow.  He is able to activate his DynaVox only with a single movement in that location and he requires [place] scanning to help him communicate.  He is interactive with environment and understands conversation well.  He simply has trouble with vocalization due to his debility.

## 2020-12-04 NOTE — Progress Notes (Signed)
Virtual Visit via Video Note  I connected with [name] on 12/03/20 at  2:40 PM EST by a video enabled telemedicine application and verified that I am speaking with the correct person using two identifiers.  Location: Patient: home, patient, his mother and provider are in visit Provider: office   I discussed the limitations of evaluation and management by telemedicine and the availability of in person appointments. The patient expressed understanding and agreed to proceed. S Chism, CMA was able to get the patient set up with video visit   Subjective:    Patient ID: [name], male    DOB: 11-Jun-1986, 35 y.o.   MRN: [id number]  Chief Complaint  Patient presents with  . Follow-up    HPI Patient is in today for evaluation of need for new DynaVox. He has been using a DynaVox for years but his current device needs to be replaced. He is dependent on his DynaVox in all environments to control his surroundings and interact. 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 He has limited fine motor skills and thus requires a switch behind his left elbow to activate his DynaVox. No other acut concerns, recent febrile illness or changes since last visit.   Past Medical History:  Diagnosis Date  . Cerebral palsy ([place])   . Dehydration 11/22/2013  . Depression with anxiety 08/01/2010   Qualifier: Diagnosis of  By: Nelson-Smith CMA (AAMA), Dottie    . Dyslipidemia 08/19/2017  . Esophagitis 2011  . Gastrostomy in place [facility]) 08/31/2013  . GERD (gastroesophageal reflux disease)   . Hyperlipidemia, mild 08/25/2015  . Hyperthyroidism   . Incontinence of feces   . Loss of weight 08/28/2014  . Medicare annual wellness visit, subsequent 08/25/2015  . Mildly underweight adult 03/16/2017  . Palpitations   . Skin lesion of right ear 03/16/2017  . Thyroid disease 08/01/2010   Qualifier: Diagnosis of   By: Harlon Ditty CMA (AAMA), Dottie      Past Surgical History:  Procedure Laterality Date  . baclofen trial    . baslofen pump implant    . ears tubes    . EYE SURGERY    . FLEXIBLE SIGMOIDOSCOPY N/A 09/07/2014   Procedure: FLEXIBLE SIGMOIDOSCOPY;  Surgeon: [name], MD;  Location: [facility] ENDOSCOPY;  Service: Endoscopy;  Laterality: N/A;  . g-tube insert  August 2006  . hamstring released     to treat contractures.   Marland Kitchen HIP SURGERY     x2 , side   . IR CM INJ ANY COLONIC TUBE W/FLUORO  05/28/2017  . IR CM INJ ANY COLONIC TUBE W/FLUORO  07/07/2019  . IR CM INJ ANY COLONIC TUBE W/FLUORO  09/18/2019  . IR GASTR TUBE CONVERT GASTR-JEJ PER W/FL MOD SED  03/14/2019  . IR GENERIC HISTORICAL  07/01/2016   IR GASTR TUBE CONVERT GASTR-JEJ PER W/FL MOD SED 07/01/2016 [name], MD [facility]INTER[facility]  . IR GENERIC HISTORICAL  07/08/2016   IR PATIENT EVAL TECH 0-60 MINS 07/08/2016 [name], MD [facility]  . IR GENERIC HISTORICAL  07/14/2016   IR GJ TUBE CHANGE 07/14/2016 [name], MD [facility]  . IR GENERIC HISTORICAL  07/21/2016   IR PATIENT EVAL TECH 0-60 MINS [facility]  . IR GENERIC HISTORICAL  08/31/2016   IR [place] DUODEN/JEJUNO TUBE PERCUT W/FLUORO 08/31/2016 [name], MD [facility]  . IR GENERIC HISTORICAL  09/03/2016   IR GJ TUBE CHANGE 09/03/2016 [name], MD [facility]  . IR GENERIC HISTORICAL  09/10/2016   IR GASTR TUBE CONVERT GASTR-JEJ PER W/FL MOD SED 09/10/2016 [facility]  . IR GENERIC HISTORICAL  09/16/2016   IR PATIENT EVAL TECH 0-60 MINS [facility]  . IR GENERIC HISTORICAL  09/29/2016   IR GJ TUBE CHANGE 09/29/2016 [name], MD [facility]  . IR GJ TUBE CHANGE  02/05/2017  . IR GJ TUBE CHANGE  05/21/2017  . IR GJ TUBE CHANGE  08/25/2017  . IR GJ TUBE CHANGE  01/18/2018  . IR GJ TUBE CHANGE  02/04/2018  . IR GJ TUBE CHANGE  04/21/2018  . IR GJ TUBE CHANGE  08/18/2018  . IR GJ TUBE CHANGE  09/12/2019  . IR GJ TUBE CHANGE  01/03/2020  . IR GJ TUBE  CHANGE  05/13/2020  . IR GJ TUBE CHANGE  09/11/2020  . IR MECH REMOV OBSTRUC MAT ANY COLON TUBE W/FLUORO  09/02/2018  . IR REPLC GASTRO/COLONIC TUBE PERCUT W/FLUORO  10/17/2018  . PEG PLACEMENT  10/21/2011   Procedure: PERCUTANEOUS ENDOSCOPIC GASTROSTOMY (PEG) REPLACEMENT;  Surgeon: [name], MD;  Location: [facility] ENDOSCOPY;  Service: Endoscopy;  Laterality: N/A;  . PEG PLACEMENT N/A 06/13/2013   Procedure: PERCUTANEOUS ENDOSCOPIC GASTROSTOMY (PEG) REPLACEMENT;  Surgeon: [name], MD;  Location: [facility] ENDOSCOPY;  Service: Endoscopy;  Laterality: N/A;  . SPINAL FUSION    . spinal fusion to correct 70 degree kyphosis  11-2010  . spinal fusioncorrect 106 degree kyphosis    . SPINE SURGERY  ,11/20/2010, 2011   for correction of severe contracturing spinal kyphosis.   . TONSILLECTOMY      Family History  Problem Relation Age of Onset  . Asthma Mother   . Hyperlipidemia Mother   . COPD Mother   . Other Mother        bronchial stasis/ABPA  . Cancer Maternal Grandmother 60       breast  . Hyperlipidemia Maternal Grandmother   . Hypertension Maternal Grandmother   . Cancer Maternal Grandfather        prostate  . Heart disease Paternal Grandfather        CHF  . Osteoporosis Paternal Grandmother   . Arthritis Paternal Grandmother        rheumatoid    Social History   Socioeconomic History  . Marital status: Single    Spouse name: Not on file  . Number of children: 0  . Years of education: Not on file  . Highest education level: Not on file  Occupational History  . Occupation: disbaled  Tobacco Use  . Smoking status: Never Smoker  . Smokeless tobacco: Never Used  Vaping Use  . Vaping Use: Never used  Substance and Sexual Activity  . Alcohol use: No  . Drug use: No  . Sexual activity: Never  Other Topics Concern  . Not on file  Social History Narrative  . Not on file   Social Determinants of Health   Financial Resource Strain: Not on file  Food Insecurity: Not on file   Transportation Needs: Not on file  Physical Activity: Not on file  Stress: Not on file  Social Connections: Not on file  Intimate Partner Violence: Not on file    Outpatient Medications Prior to Visit  Medication Sig Dispense Refill  . AMBULATORY NON FORMULARY MEDICATION Medication Name: MIC gastrostomy/bolus feeding tube 24 French Part number 0110-24. #2 and 10 cc lurer lock  syringe #2 Dx: 4 Device 2  . clindamycin (CLEOCIN) 300 MG capsule Take 1 capsule (300 mg total) by mouth 3 (three) times daily. 21 capsule 0  . Control Gel Formula Dressing (DUODERM CGF DRESSING) MISC Apply to affected area every other day or as needed. 30 each 0  . dantrolene (DANTRIUM) 50 MG capsule TAKE 1 CAPSULE BY MOUTH 3 TIMES DAILY. 90 capsule 10  . divalproex (DEPAKOTE SPRINKLE) 125 MG capsule Take 2 capsules in morning, 5 capsules at bedtime (Patient taking differently: 250-625 mg. Take 2 capsules in morning, 5 capsules at bedtime) 210 capsule 4  . Incontinence Supply Disposable (PREVAIL BREEZERS MEDIUM) MISC pkg of 16- size medium 32" to 44"  Breathable cloth-like outer fabric (can't use the plastic outer surgace  Item # [id number] 16 each 6  . LORazepam (ATIVAN) 1 MG tablet Take one tablet in evenings as needed for insomnia. (Patient taking differently: 1.5 mg. Take and one half tablet in evenings for insomnia.) 30 tablet 1  . Misc. Devices (ALL-BODY MASSAGE) MISC 1 Units/hr by Does not apply route as needed. Full body massage for Muscle spasticity due to Cerebral palsy 99 each 99  . mupirocin ointment (BACTROBAN) 2 % Apply to area thin film twice daily if needed (Patient taking differently: as needed. Apply to area thin film twice daily if needed) 22 g 0  . NON FORMULARY Bard Leg Bag Extension tubing w/Connector 18", Sterile, latex-free  Item# [id number]    . NON FORMULARY Colorplast Freedom Cath Latex Self-Adhering Male External Catheter 34mm Diameter Intermediate  Item# [id number]    . NONFORMULARY OR  COMPOUNDED ITEM Cov[employer]ien REF [id number] - [place] Joey Pump Set with Flush Bags - 1000 mL 1 each 0  . Nutritional Supplements (FEEDING SUPPLEMENT, KATE FARMS STANDARD 1.4,) LIQD liquid Take 325 mLs by mouth as directed. 3 carton over 16 hrs    . OLANZapine (ZYPREXA) 2.5 MG tablet Place 5 mg into feeding tube 2 (two) times daily. 2.5 mg in the am and 5 mg BID  3  . Omeprazole-Sodium Bicarbonate (ZEGERID) 20-1100 MG CAPS capsule Take 1 capsule by mouth daily before breakfast.    . Ostomy Supplies (PROTECTIVE BARRIER WIPES) MISC 1-1/4" X 3"  Item [id number] 75 each 6  . PARoxetine (PAXIL) 10 MG tablet Take 15 mg by mouth daily.    . sucralfate (CARAFATE) 1 g tablet TAKE 1 TABLET BY MOUTH 4 TIMES DAILY - WITH MEALS AND AT BEDTIME. (Patient taking differently: as needed.) 120 tablet 1   No facility-administered medications prior to visit.    Allergies  Allergen Reactions  . Ambien [Zolpidem Tartrate] Nausea Only  . Antihistamines, Chlorpheniramine-Type     Other reaction(s): Other (See Comments) Other Reaction: agitation  . Augmentin [Amoxicillin-Pot Clavulanate] Diarrhea  . Codeine Other (See Comments)    Makes patient too active after a few days.  . Metoclopramide Other (See Comments)    Delusion, emotionality   . Baclofen Anxiety  . Pheniramine Rash    Other reaction(s): Other (See Comments) Other Reaction: agitation  . Sulfa Antibiotics Rash  . Sulfonamide Derivatives Rash    Review of Systems  Constitutional: Negative for fever and malaise/fatigue.  HENT: Negative for congestion.   Eyes: Negative for blurred vision.  Respiratory: Negative for cough.   Cardiovascular: Negative for leg swelling.  Gastrointestinal: Negative for abdominal pain, blood in stool and nausea.  Genitourinary: Negative for dysuria and frequency.  Musculoskeletal: Negative for falls.  Skin: Negative for rash.  Neurological: Negative for  dizziness, loss of consciousness and headaches.  Endo/Heme/Allergies:  Negative for environmental allergies.       Objective:    Physical Exam  There were no vitals taken for this visit. Wt Readings from Last 3 Encounters:  05/28/20 (!) 92 lb 3.2 oz (41.8 kg)  03/28/20 92 lb (41.7 kg)  08/30/19 105 lb (47.6 kg)    Diabetic Foot Exam - Simple   No data filed    Lab Results  Component Value Date   WBC 6.7 09/09/2020   HGB 15.9 09/09/2020   HCT 47.6 09/09/2020   PLT 185.0 09/09/2020   GLUCOSE 80 09/09/2020   CHOL 131 09/09/2020   TRIG 84.0 09/09/2020   HDL 35.80 (L) 09/09/2020   LDLCALC 78 09/09/2020   ALT 20 09/09/2020   AST 18 09/09/2020   NA 141 09/09/2020   K 3.9 09/09/2020   CL 102 09/09/2020   CREATININE 0.29 (L) 09/09/2020   BUN 8 09/09/2020   CO2 29 09/09/2020   TSH 2.14 09/09/2020   HGBA1C 5.0 04/29/2007    Lab Results  Component Value Date   TSH 2.14 09/09/2020   Lab Results  Component Value Date   WBC 6.7 09/09/2020   HGB 15.9 09/09/2020   HCT 47.6 09/09/2020   MCV 94.5 09/09/2020   PLT 185.0 09/09/2020   Lab Results  Component Value Date   NA 141 09/09/2020   K 3.9 09/09/2020   CO2 29 09/09/2020   GLUCOSE 80 09/09/2020   BUN 8 09/09/2020   CREATININE 0.29 (L) 09/09/2020   BILITOT 0.4 09/09/2020   ALKPHOS 79 09/09/2020   AST 18 09/09/2020   ALT 20 09/09/2020   PROT 7.2 09/09/2020   ALBUMIN 4.6 09/09/2020   CALCIUM 9.3 09/09/2020   ANIONGAP 11 09/18/2014   GFR 154.71 09/09/2020   Lab Results  Component Value Date   CHOL 131 09/09/2020   Lab Results  Component Value Date   HDL 35.80 (L) 09/09/2020   Lab Results  Component Value Date   LDLCALC 78 09/09/2020   Lab Results  Component Value Date   TRIG 84.0 09/09/2020   Lab Results  Component Value Date   CHOLHDL 4 09/09/2020   Lab Results  Component Value Date   HGBA1C 5.0 04/29/2007       Assessment & Plan:   Problem List Items Addressed This Visit    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 Naaman Plummer his physiatrist to obtain a new power wheelchair which he is dependent upon.  He requires total care for all activities of daily living.  He is unable to speak clearly due to his ongoing debility and thus has been dependent on a DynaVox since he was young.  He is currently in need of a new one as his 1 is very old and is unable to be updated.  He will also require a mount for his power wheelchair to help him utilize his DynaVox appropriately.  He requires the DynaVox to support him in all of his environments including at home, at physical therapy and more.  He has very limited fine motor skills and as a result requires an adaptive switch on his DynaVox to be located behind his left elbow.  He is able to activate his DynaVox only with a single movement in that location and he requires [place] scanning to help him communicate.  He is interactive with environment and understands conversation well.  He simply  has trouble with vocalization due to his debility.         I am having Chad Matte "[name]" maintain his divalproex, Omeprazole-Sodium Bicarbonate, NON FORMULARY, NON FORMULARY, Protective Barrier Wipes, Prevail Breezers Medium, NONFORMULARY OR COMPOUNDED ITEM, LORazepam, PARoxetine, AMBULATORY NON FORMULARY MEDICATION, OLANZapine, mupirocin ointment, All-Body Massage, sucralfate, feeding supplement (KATE FARMS STANDARD 1.4), DuoDERM CGF Dressing, dantrolene, and clindamycin.  No orders of the defined types were placed in this encounter.    [name], MD    I discussed the assessment and treatment plan with the patient. The patient was provided an opportunity to ask questions and all were answered. The patient agreed with the plan and demonstrated an understanding of the instructions.   The patient was advised to call back or seek an in-person evaluation if the symptoms worsen or if the condition fails to improve as anticipated.  I provided 15 minutes of non-face-to-face time  during this encounter.   [name], MD

## 2020-12-05 ENCOUNTER — Telehealth: Payer: Self-pay

## 2020-12-05 NOTE — Telephone Encounter (Signed)
Per Mrs. Bohman (mother) of Harish Bram :   Patient is in need of a new motorized wheelchair. She has been advised to make an appointment for assessment, ASAP. Or call his PCP.  Please advise if there another option.   Thank you

## 2020-12-09 NOTE — Telephone Encounter (Signed)
He will need a power w/c evaluation by therapy. Then after that he can come see me, and I review the PT orders and do the appropriate documentation in the [place] encounter to send it forward to the vendor.  If she would like me to get this process started, I can make a referral to neuro-rehab.

## 2020-12-10 NOTE — Telephone Encounter (Signed)
Please see Dr. Naaman Plummer reply.

## 2020-12-10 NOTE — Telephone Encounter (Signed)
He has already had the wheelchair eval with PT in August and [facility] back in October.  His mother is confused with all of this and if she had known he needed a face to face it probably could have been done at the Jan appt when Deigo did not want his botox.  Sounds like they need to set the appt with you for the face to face.

## 2020-12-10 NOTE — Telephone Encounter (Signed)
Please arrange for F2F visit with me. Put on cancellation list.  I left VM with mother

## 2020-12-11 ENCOUNTER — Encounter: Payer: Self-pay | Admitting: Physical Medicine & Rehabilitation

## 2020-12-11 ENCOUNTER — Encounter: Payer: Medicare Other | Attending: Physical Medicine & Rehabilitation | Admitting: Physical Medicine & Rehabilitation

## 2020-12-11 ENCOUNTER — Other Ambulatory Visit: Payer: Self-pay

## 2020-12-11 VITALS — BP 110/75 | HR 92 | Temp 98.3°F

## 2020-12-11 DIAGNOSIS — G243 Spasmodic torticollis: Secondary | ICD-10-CM

## 2020-12-11 DIAGNOSIS — G808 Other cerebral palsy: Secondary | ICD-10-CM | POA: Diagnosis not present

## 2020-12-11 NOTE — Progress Notes (Signed)
Subjective:    Patient ID: [name], male    DOB: 08/30/1986, 35 y.o.   MRN: [id number]  HPI   [name] is here in follow up of his CP and associated deficits. He is here for the primary purpose of a wheelchair evaluation. His current wheelchair is in [place]. He has elected not to pursue future botox given that he was not a big fan of the procedure and that the results did not last long enough. He is not having pain. Mom says that new caregivers are in the house which are really attentive to his needs, and [name] really likes him.   He's had some skin breakdown due to his old w/c. Mom has made some adlib adjustments to the chair, removing supports, adding cushioning, etc  He has not had any recent pulmonary or bladder infections. .    Pain Inventory Average Pain 0 Pain Right Now 0 My pain is no pain  In the last 24 hours, has pain interfered with the following? General activity 0 Relation with others 0 Enjoyment of life 0 What TIME of day is your pain at its worst? No pain  Sleep (in general) Fair  Pain is worse with: no pain Pain improves with: no pain Relief from Meds: no pain  Family History  Problem Relation Age of Onset  . Asthma Mother   . Hyperlipidemia Mother   . COPD Mother   . Other Mother        bronchial stasis/ABPA  . Cancer Maternal Grandmother 89       breast  . Hyperlipidemia Maternal Grandmother   . Hypertension Maternal Grandmother   . Cancer Maternal Grandfather        prostate  . Heart disease Paternal Grandfather        CHF  . Osteoporosis Paternal Grandmother   . Arthritis Paternal Grandmother        rheumatoid   Social History   Socioeconomic History  . Marital status: Single    Spouse name: Not on file  . Number of children: 0  . Years of education: Not on file  . Highest education level: Not on file  Occupational History  . Occupation: disbaled  Tobacco Use  . Smoking status: Never Smoker  . Smokeless tobacco: Never Used   Vaping Use  . Vaping Use: Never used  Substance and Sexual Activity  . Alcohol use: No  . Drug use: No  . Sexual activity: Never  Other Topics Concern  . Not on file  Social History Narrative  . Not on file   Social Determinants of Health   Financial Resource Strain: Not on file  Food Insecurity: Not on file  Transportation Needs: Not on file  Physical Activity: Not on file  Stress: Not on file  Social Connections: Not on file   Past Surgical History:  Procedure Laterality Date  . baclofen trial    . baslofen pump implant    . ears tubes    . EYE SURGERY    . FLEXIBLE SIGMOIDOSCOPY N/A 09/07/2014   Procedure: FLEXIBLE SIGMOIDOSCOPY;  Surgeon: [name], MD;  Location: [facility] ENDOSCOPY;  Service: Endoscopy;  Laterality: N/A;  . g-tube insert  August 2006  . hamstring released     to treat contractures.   Marland Kitchen HIP SURGERY     x2 , side   . IR CM INJ ANY COLONIC TUBE W/FLUORO  05/28/2017  . IR CM INJ ANY COLONIC TUBE W/FLUORO  07/07/2019  .  IR CM INJ ANY COLONIC TUBE W/FLUORO  09/18/2019  . IR GASTR TUBE CONVERT GASTR-JEJ PER W/FL MOD SED  03/14/2019  . IR GENERIC HISTORICAL  07/01/2016   IR GASTR TUBE CONVERT GASTR-JEJ PER W/FL MOD SED 07/01/2016 [name], MD [facility]  . IR GENERIC HISTORICAL  07/08/2016   IR PATIENT EVAL TECH 0-60 MINS 07/08/2016 [name], MD [facility]  . IR GENERIC HISTORICAL  07/14/2016   IR GJ TUBE CHANGE 07/14/2016 [name], MD [facility]  . IR GENERIC HISTORICAL  07/21/2016   IR PATIENT EVAL TECH 0-60 MINS [facility]  . IR GENERIC HISTORICAL  08/31/2016   IR [place] DUODEN/JEJUNO TUBE PERCUT W/FLUORO 08/31/2016 [name], MD [facility]  . IR GENERIC HISTORICAL  09/03/2016   IR GJ TUBE CHANGE 09/03/2016 [name], MD [facility]  . IR GENERIC HISTORICAL  09/10/2016   IR GASTR TUBE CONVERT GASTR-JEJ PER W/FL MOD SED 09/10/2016 [facility]  . IR GENERIC HISTORICAL  09/16/2016   IR PATIENT EVAL TECH 0-60 MINS [facility]  . IR  GENERIC HISTORICAL  09/29/2016   IR GJ TUBE CHANGE 09/29/2016 [name], MD [facility]  . IR GJ TUBE CHANGE  02/05/2017  . IR GJ TUBE CHANGE  05/21/2017  . IR GJ TUBE CHANGE  08/25/2017  . IR GJ TUBE CHANGE  01/18/2018  . IR GJ TUBE CHANGE  02/04/2018  . IR GJ TUBE CHANGE  04/21/2018  . IR GJ TUBE CHANGE  08/18/2018  . IR GJ TUBE CHANGE  09/12/2019  . IR GJ TUBE CHANGE  01/03/2020  . IR GJ TUBE CHANGE  05/13/2020  . IR GJ TUBE CHANGE  09/11/2020  . IR MECH REMOV OBSTRUC MAT ANY COLON TUBE W/FLUORO  09/02/2018  . IR REPLC GASTRO/COLONIC TUBE PERCUT W/FLUORO  10/17/2018  . PEG PLACEMENT  10/21/2011   Procedure: PERCUTANEOUS ENDOSCOPIC GASTROSTOMY (PEG) REPLACEMENT;  Surgeon: [name], MD;  Location: [facility];  Service: Endoscopy;  Laterality: N/A;  . PEG PLACEMENT N/A 06/13/2013   Procedure: PERCUTANEOUS ENDOSCOPIC GASTROSTOMY (PEG) REPLACEMENT;  Surgeon: [name], MD;  Location: [facility];  Service: Endoscopy;  Laterality: N/A;  . SPINAL FUSION    . spinal fusion to correct 70 degree kyphosis  11-2010  . spinal fusioncorrect 106 degree kyphosis    . SPINE SURGERY  ,11/20/2010, 2011   for correction of severe contracturing spinal kyphosis.   . TONSILLECTOMY     Past Surgical History:  Procedure Laterality Date  . baclofen trial    . baslofen pump implant    . ears tubes    . EYE SURGERY    . FLEXIBLE SIGMOIDOSCOPY N/A 09/07/2014   Procedure: FLEXIBLE SIGMOIDOSCOPY;  Surgeon: [name], MD;  Location: [facility] ENDOSCOPY;  Service: Endoscopy;  Laterality: N/A;  . g-tube insert  August 2006  . hamstring released     to treat contractures.   Marland Kitchen HIP SURGERY     x2 , side   . IR CM INJ ANY COLONIC TUBE W/FLUORO  05/28/2017  . IR CM INJ ANY COLONIC TUBE W/FLUORO  07/07/2019  . IR CM INJ ANY COLONIC TUBE W/FLUORO  09/18/2019  . IR GASTR TUBE CONVERT GASTR-JEJ PER W/FL MOD SED  03/14/2019  . IR GENERIC HISTORICAL  07/01/2016   IR GASTR TUBE CONVERT GASTR-JEJ PER W/FL MOD SED  07/01/2016 [name], MD [facility]  . IR GENERIC HISTORICAL  07/08/2016   IR PATIENT EVAL TECH 0-60 MINS 07/08/2016 Eulas Post  Kathlene Cote, MD [facility]  . IR GENERIC HISTORICAL  07/14/2016   IR GJ TUBE CHANGE 07/14/2016 [name], MD [facility]  . IR GENERIC HISTORICAL  07/21/2016   IR PATIENT EVAL TECH 0-60 MINS [facility]  . IR GENERIC HISTORICAL  08/31/2016   IR [place] DUODEN/JEJUNO TUBE PERCUT W/FLUORO 08/31/2016 [name], MD [facility]  . IR GENERIC HISTORICAL  09/03/2016   IR GJ TUBE CHANGE 09/03/2016 [name], MD [facility]  . IR GENERIC HISTORICAL  09/10/2016   IR GASTR TUBE CONVERT GASTR-JEJ PER W/FL MOD SED 09/10/2016 [facility]  . IR GENERIC HISTORICAL  09/16/2016   IR PATIENT EVAL TECH 0-60 MINS [facility]  . IR GENERIC HISTORICAL  09/29/2016   IR GJ TUBE CHANGE 09/29/2016 [name], MD [facility]  . IR GJ TUBE CHANGE  02/05/2017  . IR GJ TUBE CHANGE  05/21/2017  . IR GJ TUBE CHANGE  08/25/2017  . IR GJ TUBE CHANGE  01/18/2018  . IR GJ TUBE CHANGE  02/04/2018  . IR GJ TUBE CHANGE  04/21/2018  . IR GJ TUBE CHANGE  08/18/2018  . IR GJ TUBE CHANGE  09/12/2019  . IR GJ TUBE CHANGE  01/03/2020  . IR GJ TUBE CHANGE  05/13/2020  . IR GJ TUBE CHANGE  09/11/2020  . IR MECH REMOV OBSTRUC MAT ANY COLON TUBE W/FLUORO  09/02/2018  . IR REPLC GASTRO/COLONIC TUBE PERCUT W/FLUORO  10/17/2018  . PEG PLACEMENT  10/21/2011   Procedure: PERCUTANEOUS ENDOSCOPIC GASTROSTOMY (PEG) REPLACEMENT;  Surgeon: [name], MD;  Location[facility];  Service: Endoscopy;  Laterality: N/A;  . PEG PLACEMENT N/A 06/13/2013   Procedure: PERCUTANEOUS ENDOSCOPIC GASTROSTOMY (PEG) REPLACEMENT;  Surgeon: [name], MD;  Location: [facility];  Service: Endoscopy;  Laterality: N/A;  . SPINAL FUSION    . spinal fusion to correct 70 degree kyphosis  11-2010  . spinal fusioncorrect 106 degree kyphosis    . SPINE SURGERY  ,11/20/2010, 2011   for correction of severe contracturing spinal  kyphosis.   . TONSILLECTOMY     Past Medical History:  Diagnosis Date  . Cerebral palsy ([place])   . Dehydration 11/22/2013  . Depression with anxiety 08/01/2010   Qualifier: Diagnosis of  By: Nelson-Smith CMA (AAMA), Dottie    . Dyslipidemia 08/19/2017  . Esophagitis 2011  . Gastrostomy in place [facility]) 08/31/2013  . GERD (gastroesophageal reflux disease)   . Hyperlipidemia, mild 08/25/2015  . Hyperthyroidism   . Incontinence of feces   . Loss of weight 08/28/2014  . Medicare annual wellness visit, subsequent 08/25/2015  . Mildly underweight adult 03/16/2017  . Palpitations   . Skin lesion of right ear 03/16/2017  . Thyroid disease 08/01/2010   Qualifier: Diagnosis of  By: Nelson-Smith CMA (AAMA), Dottie     BP 110/75   Pulse 92   Temp 98.3 F (36.8 C) (Axillary)   SpO2 99%   Opioid Risk Score:   Fall Risk Score:  `1  Depression screen PHQ 2/9  Depression screen [facility] 2/9 02/23/2018 11/22/2017  Decreased Interest 0 0  Down, Depressed, Hopeless 0 0  PHQ - 2 Score 0 0  Some recent data might be hidden    Review of Systems  Constitutional: Negative.   HENT: Negative.   Eyes: Negative.   Respiratory: Negative.   Cardiovascular: Negative.   Gastrointestinal: Negative.   Endocrine: Negative.   Genitourinary: Negative.   Musculoskeletal: Negative.   Skin: Negative.   Allergic/Immunologic: Negative.  Neurological: Negative.   Hematological: Negative.   Psychiatric/Behavioral: Negative.   All other systems reviewed and are negative.      Objective:   Physical Exam  General: No acute distress HEENT: EOMI, oral membranes moist Cards: reg rate  Chest: normal effort Abdomen: Soft, NT, ND, PEG site CDI Skin: dry, intact Extremities: no edema Skin: Clean and intact without signs of breakdown Neuro:  Very alert. Dysarthric speech. Spastic tetraplegia. Fixed deformities in his upper extremities. DTR's 3+ .  Musculoskeletal: flexion contractures of hips, elbows,  wrists Psych:  pleasant and alert           Assessment & Plan:  1. 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 Cervical Dystonia 3.  Severe dysarthria and dysphagia 4.  Bilateral hip pain/low back pain and hip flexor tightness     Plan:  1.  Mobility Assessment  Reyaan Thoma was seen today for the purpose of a mobility assessment for a powered wheelchair. I have reviewed and agree with the detailed PT evaluation. He suffers from tetraplegia related to cerebral palsy. Due to his spastic tetraplegia, he is unable to utilize a cane, walker, manual wheelchair, or scooter. The patient is appropriate for a customized power wheelchair with a power tilt and recline seat.  His current chair is 37 years old and in disrepair. With this new power wheelchair  he can move independently at a household level and on a limited basis in the community. The chair will also allow the patient to perform ADL's more easily. The patient is competent to operate the recommended chair on his own and is motivated to utilize the chair on a daily basis.    2. Will see him back in about 6 months. 15 minutes of face to face patient care time were spent during this visit. All questions were encouraged and answered.

## 2020-12-11 NOTE — Patient Instructions (Signed)
PLEASE FEEL FREE TO CALL OUR OFFICE WITH ANY PROBLEMS OR QUESTIONS ([phone])      

## 2020-12-15 IMAGING — DX DG CHEST 2V
2 series · 2 of 2 positions shown · non-contrast
Comparison: April 15, 2020

CLINICAL DATA: Cough.  Pneumonia.

EXAM:
CHEST - 2 VIEW

[chest lat]
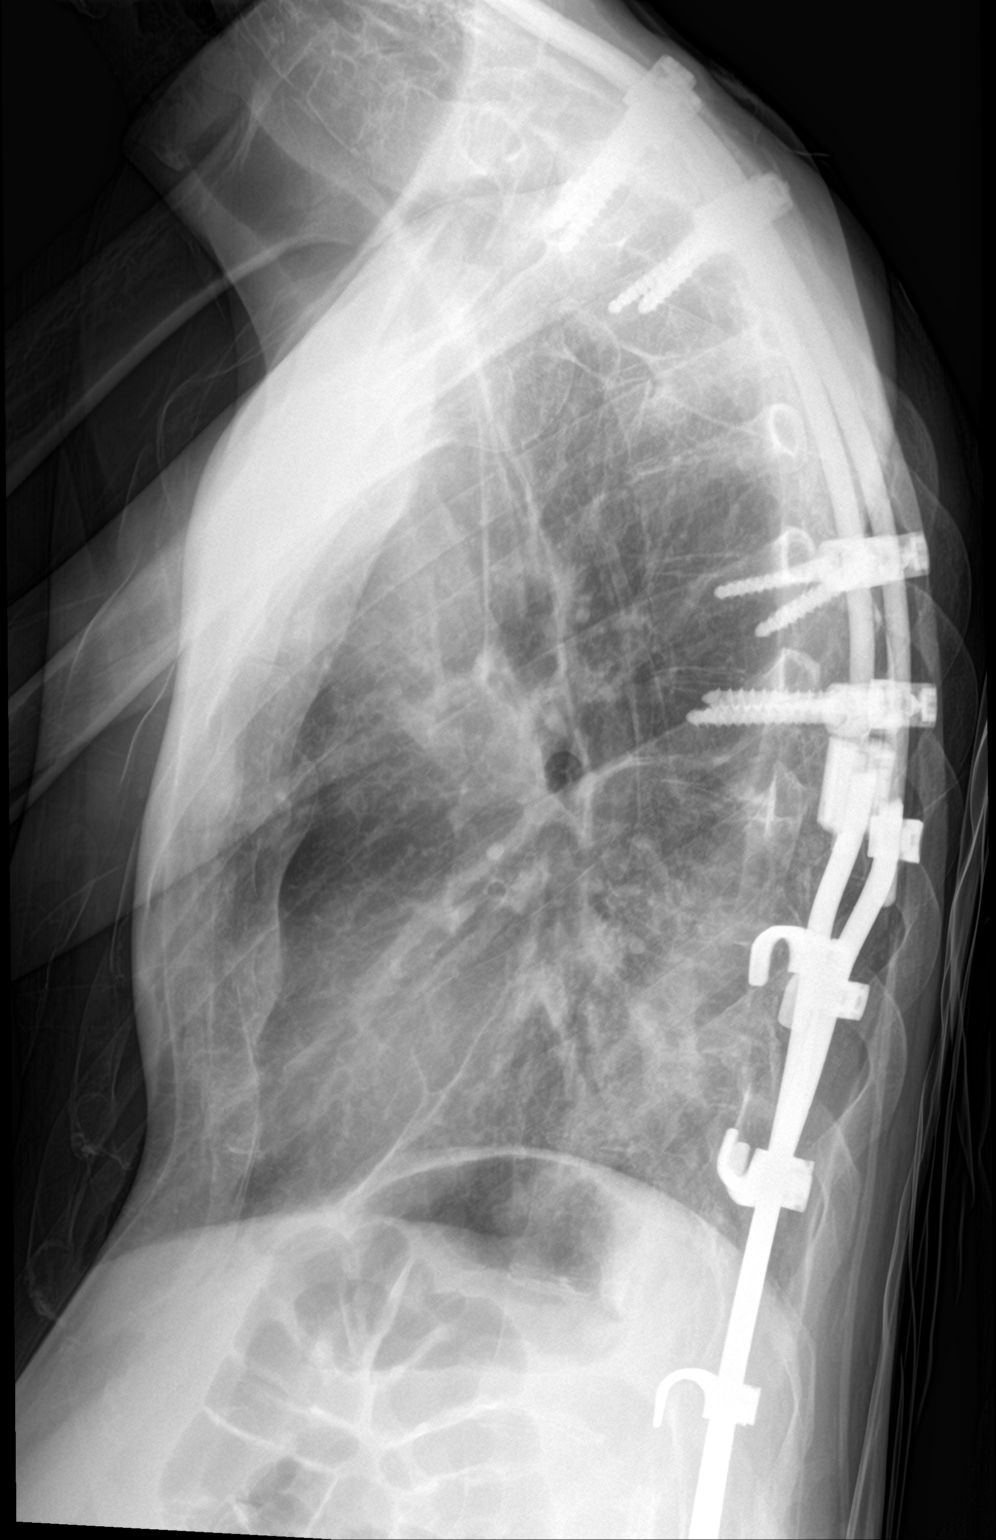

[chest ap strecther]
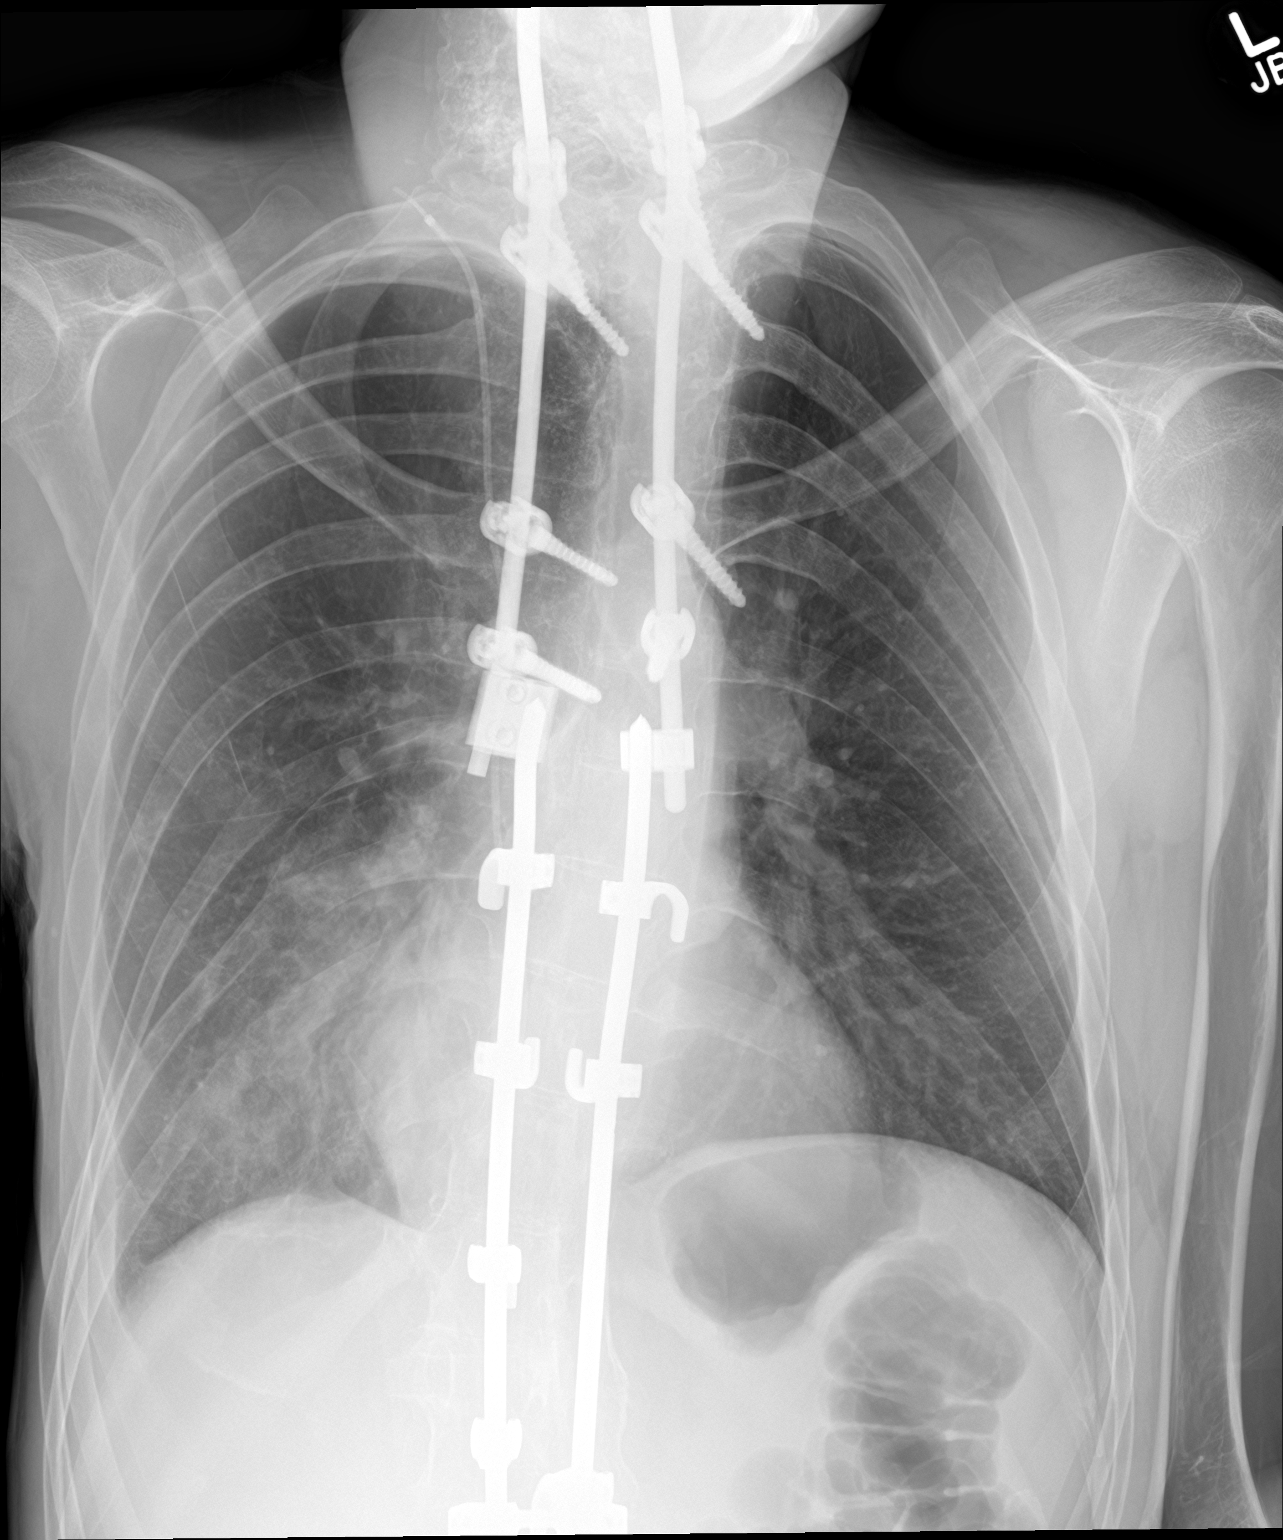

[2 of 2 positions shown; findings below may reference images not displayed]

FINDINGS: New infiltrate in the medial right lower lobe. The heart, hila,
mediastinum, lungs, and pleura are otherwise unremarkable and
unchanged. Surgical hardware remains in the thoracic spine.
IMPRESSION: New infiltrate in the medial right lower lobe consistent with
pneumonia given history. Recommend short-term follow-up imaging
after treatment to ensure resolution.

## 2020-12-17 IMAGING — XA IR REPLACE G/J TUBE W/ FLUORO
2 series · 6 of 6 positions shown · non-contrast
Comparison: none

INDICATION: 34-year-old male with cerebral palsy and a chronic indwelling
percutaneous gastrojejunostomy tube. The external portion of the
tube is cracked and leaking. Patient presents for exchange.

[Series 2: fl - angio · 4 of 33 frames shown]
[frame 5/33]
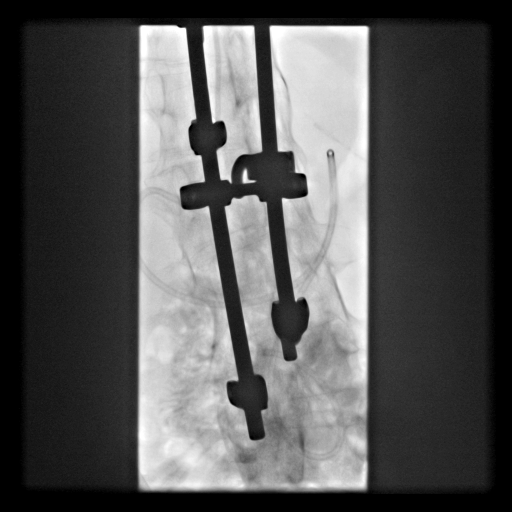
[frame 17/33]
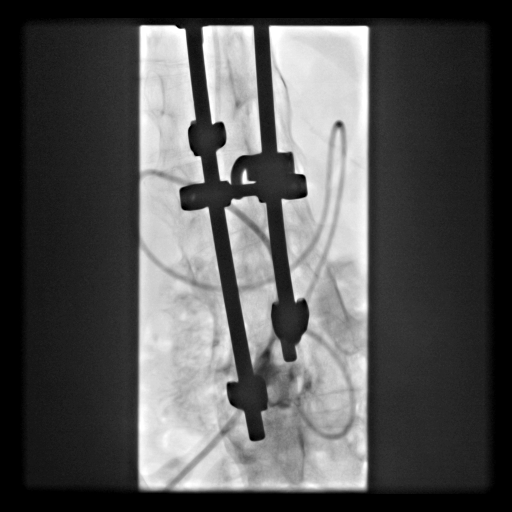
[frame 23/33]
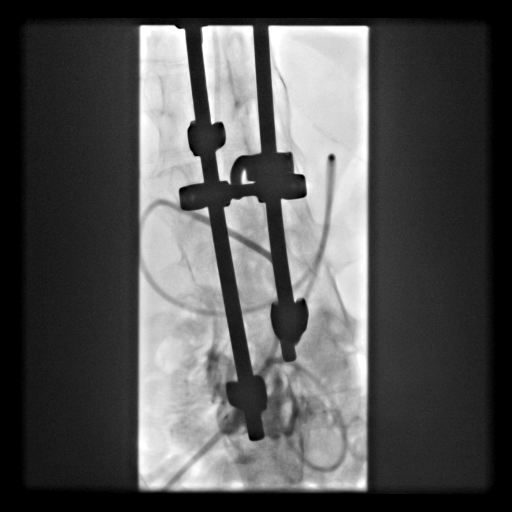
[frame 29/33]
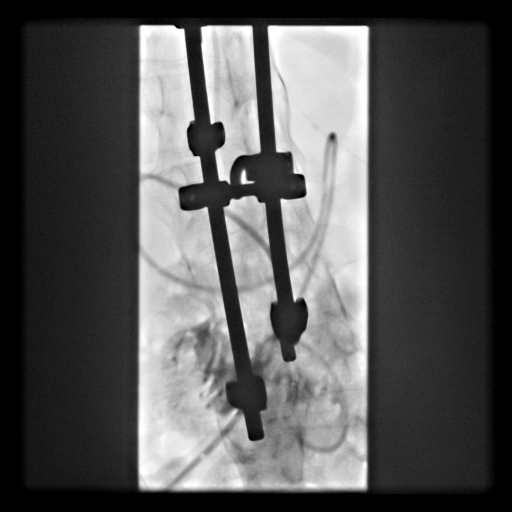

[Series 300: ir gj tube change · 2 of 2 slices shown]
[im 1/2]
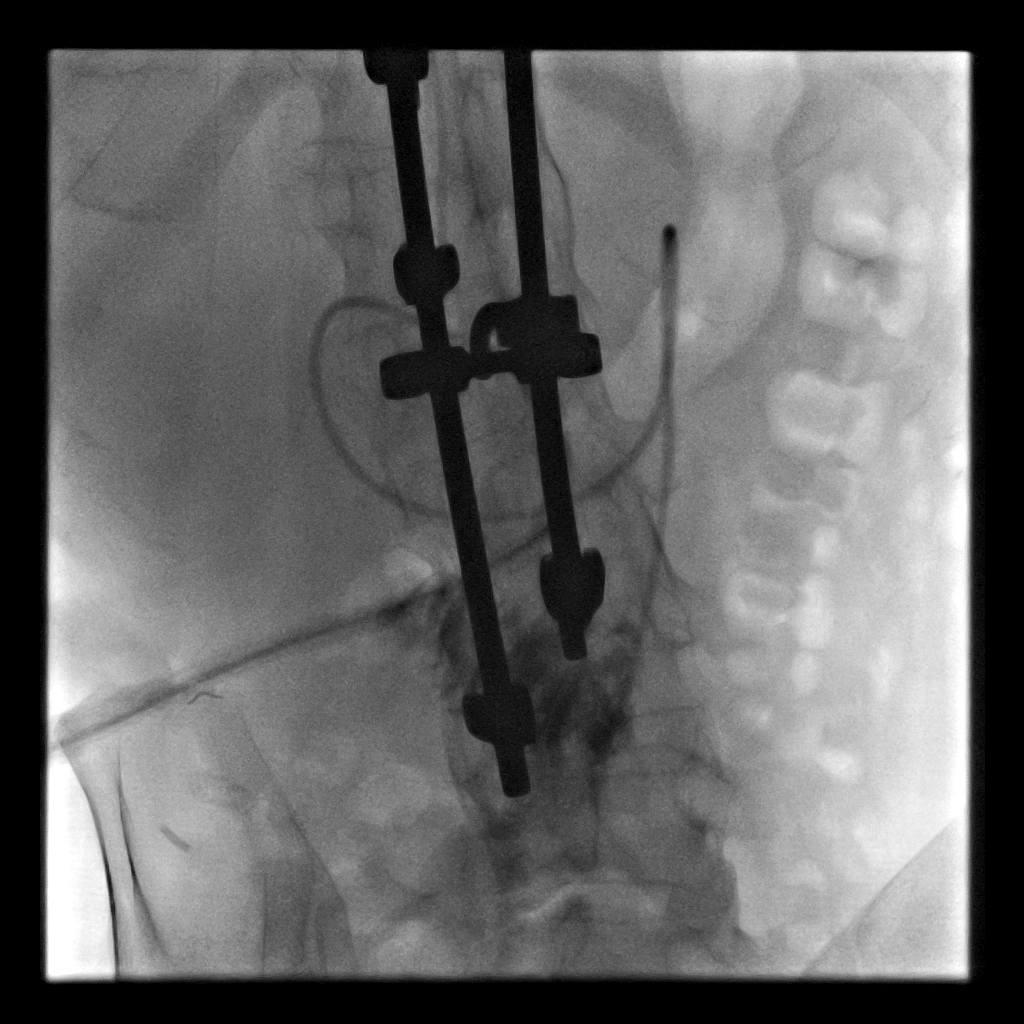
[im 2/2]
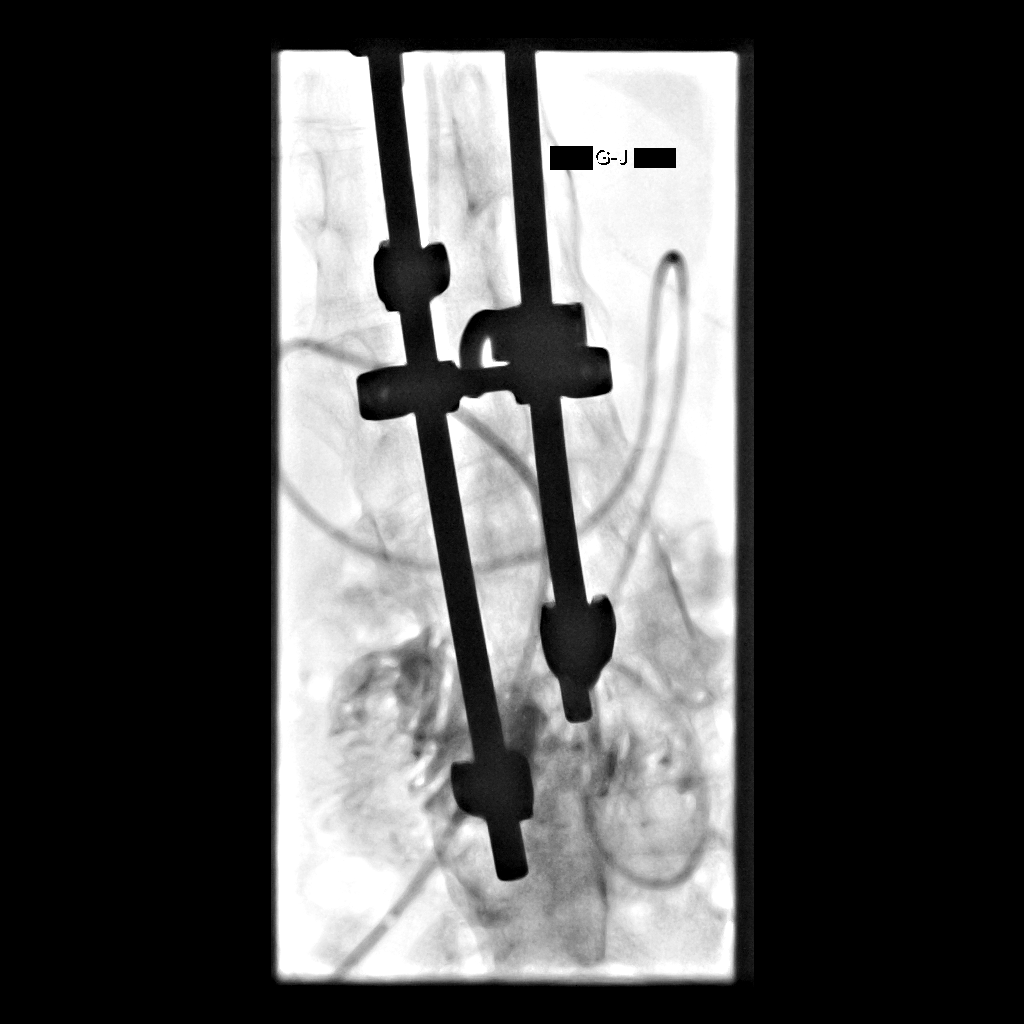

[6 of 6 positions shown; findings below may reference images not displayed]

EXAM:
JEJUNAL CATHETER REPLACEMENT

MEDICATIONS:
None

ANESTHESIA/SEDATION:
None

CONTRAST:  10mL OMNIPAQUE IOHEXOL 300 MG/ML SOLN - administered into
the gastric lumen.

FLUOROSCOPY TIME:  Fluoroscopy Time: 1 minutes 30 seconds (31 mGy).

COMPLICATIONS:
None immediate.

PROCEDURE:
Informed written consent was obtained from the patient after a
thorough discussion of the procedural risks, benefits and
alternatives. All questions were addressed. Maximal Sterile Barrier
Technique was utilized including caps, mask, sterile gowns, sterile
gloves, sterile drape, hand hygiene and skin antiseptic. A timeout
was performed prior to the initiation of the procedure.

Hand injection of contrast material was performed through the
existing tube. The tube tip is well positioned in the proximal
jejunum.

The retention balloon was deflated. The tube was partially removed
and the jejunal portion cut away from the gastric portion. The tube
was then successfully removed over a hydrophilic road runner wire. A
new 24 French gastro jejunostomy tube was then successfully advanced
over the wire until the tip was again present in the proximal
jejunum. The retention balloon was inflated with 8 mL sterile saline
and pulled snug against the anterior abdominal wall. The external
bumper was fixed in place. Repeat hand injection of contrast
material confirms that the tube is well positioned and in the
proximal jejunum.
IMPRESSION: Successful exchange for a new 24 French percutaneous gastro
jejunostomy tube.

## 2020-12-19 ENCOUNTER — Other Ambulatory Visit: Payer: Self-pay | Admitting: Physical Medicine & Rehabilitation

## 2020-12-19 DIAGNOSIS — G8250 Quadriplegia, unspecified: Secondary | ICD-10-CM

## 2020-12-19 DIAGNOSIS — G809 Cerebral palsy, unspecified: Secondary | ICD-10-CM

## 2021-01-12 ENCOUNTER — Other Ambulatory Visit: Payer: Self-pay | Admitting: Physical Medicine & Rehabilitation

## 2021-01-12 DIAGNOSIS — G809 Cerebral palsy, unspecified: Secondary | ICD-10-CM

## 2021-01-12 DIAGNOSIS — G8250 Quadriplegia, unspecified: Secondary | ICD-10-CM

## 2021-01-15 ENCOUNTER — Ambulatory Visit: Payer: Medicare Other | Admitting: Physical Medicine & Rehabilitation

## 2021-02-04 DIAGNOSIS — F339 Major depressive disorder, recurrent, unspecified: Secondary | ICD-10-CM | POA: Diagnosis not present

## 2021-02-10 ENCOUNTER — Telehealth: Payer: Self-pay | Admitting: Family Medicine

## 2021-02-10 NOTE — Telephone Encounter (Signed)
Advised that provider is out of the office.  He asked will another provider sign for her.  Will get Wendling to sign and fax back.

## 2021-02-10 NOTE — Telephone Encounter (Signed)
Caller: Eric Call Back @ [phone]  Randall Hiss from [facility] called in reference to order he faxed to our office last week. He has not received signed order back .  Please advise

## 2021-02-21 ENCOUNTER — Other Ambulatory Visit (HOSPITAL_COMMUNITY): Payer: Self-pay | Admitting: Radiology

## 2021-02-21 DIAGNOSIS — R633 Feeding difficulties, unspecified: Secondary | ICD-10-CM

## 2021-02-27 ENCOUNTER — Ambulatory Visit (HOSPITAL_COMMUNITY)
Admission: RE | Admit: 2021-02-27 | Discharge: 2021-02-27 | Disposition: A | Discharge status: Home / Self Care | Payer: Medicare Other | Source: Ambulatory Visit | Attending: Radiology | Admitting: Radiology

## 2021-02-27 ENCOUNTER — Other Ambulatory Visit: Payer: Self-pay

## 2021-02-27 DIAGNOSIS — R633 Feeding difficulties, unspecified: Secondary | ICD-10-CM | POA: Diagnosis not present

## 2021-02-27 DIAGNOSIS — Z4659 Encounter for fitting and adjustment of other gastrointestinal appliance and device: Secondary | ICD-10-CM | POA: Insufficient documentation

## 2021-02-27 DIAGNOSIS — Z434 Encounter for attention to other artificial openings of digestive tract: Secondary | ICD-10-CM | POA: Diagnosis not present

## 2021-02-27 HISTORY — PX: IR GJ TUBE CHANGE: IMG1440

## 2021-02-27 MED ORDER — IOHEXOL 300 MG/ML  SOLN
50.0000 mL | Freq: Once | INTRAMUSCULAR | Status: AC | PRN
  Administered 2021-02-27: 15 mL

## 2021-02-27 NOTE — Procedures (Signed)
Interventional Radiology Procedure:   Indications:Chronic GJ feeding tube  Procedure: GJ feeding tube exchange  Findings: 24 Fr GJ feeding tube, tip in distal duodenum  Complications: None      EBL: None  Plan: GJ feeding tube is ready to use.    [name] R. Anselm Pancoast, MD  Pager: [phone]

## 2021-03-06 ENCOUNTER — Ambulatory Visit (INDEPENDENT_AMBULATORY_CARE_PROVIDER_SITE_OTHER): Payer: Medicare Other | Admitting: Family Medicine

## 2021-03-06 ENCOUNTER — Encounter: Payer: Self-pay | Admitting: Family Medicine

## 2021-03-06 ENCOUNTER — Other Ambulatory Visit: Payer: Self-pay

## 2021-03-06 DIAGNOSIS — G809 Cerebral palsy, unspecified: Secondary | ICD-10-CM

## 2021-03-06 DIAGNOSIS — E43 Unspecified severe protein-calorie malnutrition: Secondary | ICD-10-CM | POA: Diagnosis not present

## 2021-03-06 DIAGNOSIS — E785 Hyperlipidemia, unspecified: Secondary | ICD-10-CM

## 2021-03-06 DIAGNOSIS — K9420 Gastrostomy complication, unspecified: Secondary | ICD-10-CM

## 2021-03-06 DIAGNOSIS — Z931 Gastrostomy status: Secondary | ICD-10-CM

## 2021-03-06 NOTE — Progress Notes (Signed)
Patient ID: [name], male    DOB: 11-20-1985  Age: 35 y.o. MRN: [id number]    Subjective:  Subjective  HPI [name] presents for office visit today for follow up and management on CP. His mother is speaking on behalf of the pt and she states her concern regarding his recent increased excretions and whether it is related to his acid reflux medication. She states that he feels lethargic sometimes. His mother denies any chest pain, SOB, fever, abdominal pain, cough, chills, sore throat, dysuria, back pain, HA, or VD. She expresses her concern regarding his recent weight measurement of 92 lbs, however the measurements might not be accurate and so she states her interest in getting another measurement here at the office. She reports that he get 3 daily formulas of 335 calories, but states concern that he is not gaining any weight. She states that the patient has not taken his Botox injections, because the injections are too painful for him.   Review of Systems  Constitutional: Negative for chills, fatigue and fever.  HENT: Negative for congestion, rhinorrhea, sinus pressure, sinus pain and sore throat.   Eyes: Negative for pain.  Respiratory: Negative for cough and shortness of breath.   Cardiovascular: Negative for chest pain, palpitations and leg swelling.  Gastrointestinal: Positive for nausea. Negative for abdominal pain, blood in stool, diarrhea and vomiting.  Genitourinary: Negative for flank pain, frequency and penile pain.  Musculoskeletal: Negative for back pain.  Neurological: Negative for headaches.    History Past Medical History:  Diagnosis Date  . Cerebral palsy ([place])   . Dehydration 11/22/2013  . Depression with anxiety 08/01/2010   Qualifier: Diagnosis of  By: Nelson-Smith CMA (AAMA), Dottie    . Dyslipidemia 08/19/2017  . Esophagitis 2011  . 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 GERD (gastroesophageal reflux disease)   . Hyperlipidemia, mild 08/25/2015  .  Hyperthyroidism   . Incontinence of feces   . Loss of weight 08/28/2014  . Medicare annual wellness visit, subsequent 08/25/2015  . Mildly underweight adult 03/16/2017  . Palpitations   . Skin lesion of right ear 03/16/2017  . Thyroid disease 08/01/2010   Qualifier: Diagnosis of  By: Nelson-Smith CMA (AAMA), Dottie      He has a past surgical history that includes Spine surgery (,11/20/2010, 2011); Eye surgery; ears tubes; hamstring released; baclofen trial; baslofen pump implant; Spinal fusion; g-tube insert (August 2006); spinal fusioncorrect 106 degree kyphosis; spinal fusion to correct 70 degree kyphosis (11-2010); Tonsillectomy; PEG placement (10/21/2011); PEG placement (N/A, 06/13/2013); Hip surgery; Flexible sigmoidoscopy (N/A, 09/07/2014); ir generic historical (07/01/2016); ir generic historical (07/08/2016); ir generic historical (07/14/2016); ir generic historical (07/21/2016); ir generic historical (08/31/2016); ir generic historical (09/03/2016); ir generic historical (09/10/2016); ir generic historical (09/16/2016); ir generic historical (09/29/2016); IR GJ Tube Change (02/05/2017); IR GJ Tube Change (05/21/2017); IR Cm Inj Any Colonic Tube W/Fluoro (05/28/2017); IR GJ Tube Change (08/25/2017); IR GJ Tube Change (01/18/2018); IR GJ Tube Change (02/04/2018); IR GJ Tube Change (04/21/2018); IR GJ Tube Change (08/18/2018); IR Mech Remov Obstruc Mat Any Colon Tube W/Fluoro (09/02/2018); IR Replc Gastro/Colonic Tube Percut W/Fluoro (10/17/2018); IR GASTR TUBE CONVERT GASTR-JEJ PER W/FL MOD SED (03/14/2019); IR Cm Inj Any Colonic Tube W/Fluoro (07/07/2019); IR GJ Tube Change (09/12/2019); IR Cm Inj Any Colonic Tube W/Fluoro (09/18/2019); IR GJ Tube Change (01/03/2020); IR GJ Tube Change (05/13/2020); IR GJ Tube Change (09/11/2020); and IR GJ Tube Change (02/27/2021).   His family history includes Arthritis in his  paternal grandmother; Asthma in his mother; COPD in his mother; Cancer in his maternal grandfather; Cancer (age  of onset: 28) in his maternal grandmother; Heart disease in his paternal grandfather; Hyperlipidemia in his maternal grandmother and mother; Hypertension in his maternal grandmother; Osteoporosis in his paternal grandmother; Other in his mother.He reports that he has never smoked. He has never used smokeless tobacco. He reports that he does not drink alcohol and does not use drugs.  Current Outpatient Medications on File Prior to Visit  Medication Sig Dispense Refill  . AMBULATORY NON FORMULARY MEDICATION Medication Name: MIC gastrostomy/bolus feeding tube 24 French Part number 0110-24. #2 and 10 cc lurer lock syringe #2 Dx: 4 Device 2  . Control Gel Formula Dressing (DUODERM CGF DRESSING) MISC Apply to affected area every other day or as needed. 30 each 0  . dantrolene (DANTRIUM) 50 MG capsule TAKE ONE CAPSULE BY MOUTH EVERY MORNING and TAKE ONE CAPSULE BY MOUTH EVERY EVENING and TAKE ONE CAPSULE BY MOUTH EVERYDAY AT BEDTIME 90 capsule 2  . divalproex (DEPAKOTE SPRINKLE) 125 MG capsule Take 2 capsules in morning, 5 capsules at bedtime (Patient taking differently: 250-625 mg. Take 2 capsules in morning, 5 capsules at bedtime) 210 capsule 4  . Incontinence Supply Disposable (PREVAIL BREEZERS MEDIUM) MISC pkg of 16- size medium 32" to 44"  Breathable cloth-like outer fabric (can't use the plastic outer surgace  Item # [id number] 16 each 6  . LORazepam (ATIVAN) 1 MG tablet Take one tablet in evenings as needed for insomnia. (Patient taking differently: 1.5 mg. Take and one half tablet in evenings for insomnia.) 30 tablet 1  . Misc. Devices (ALL-BODY MASSAGE) MISC 1 Units/hr by Does not apply route as needed. Full body massage for Muscle spasticity due to Cerebral palsy 99 each 99  . mupirocin ointment (BACTROBAN) 2 % Apply to area thin film twice daily if needed (Patient taking differently: as needed. Apply to area thin film twice daily if needed) 22 g 0  . NON FORMULARY Bard Leg Bag Extension tubing  w/Connector 18", Sterile, latex-free  Item# [id number]    . NON FORMULARY Colorplast Freedom Cath Latex Self-Adhering Male External Catheter 46mm Diameter Intermediate  Item# [id number]    . NONFORMULARY OR COMPOUNDED ITEM [employer] REF [id number] - Kangaroo Joey Pump Set with Flush Bags - 1000 mL 1 each 0  . Nutritional Supplements (FEEDING SUPPLEMENT, KATE FARMS STANDARD 1.4,) LIQD liquid Take 325 mLs by mouth as directed. 3 carton over 16 hrs    . OLANZapine (ZYPREXA) 2.5 MG tablet Place 5 mg into feeding tube 2 (two) times daily. 2.5 mg in the am and 5 mg BID  3  . Omeprazole-Sodium Bicarbonate (ZEGERID) 20-1100 MG CAPS capsule Take 1 capsule by mouth daily before breakfast.    . Ostomy Supplies (PROTECTIVE BARRIER WIPES) MISC 1-1/4" X 3"  Item #[id number] 75 each 6  . PARoxetine (PAXIL) 10 MG tablet Take 15 mg by mouth daily.    . sucralfate (CARAFATE) 1 g tablet TAKE 1 TABLET BY MOUTH 4 TIMES DAILY - WITH MEALS AND AT BEDTIME. (Patient taking differently: as needed.) 120 tablet 1   No current facility-administered medications on file prior to visit.     Objective:  Objective  Physical Exam Constitutional:      General: He is not in acute distress.    Appearance: Normal appearance. He is not ill-appearing or toxic-appearing.  HENT:     Head: Normocephalic and atraumatic.     Right Ear: Tympanic  membrane, ear canal and external ear normal.     Left Ear: Tympanic membrane, ear canal and external ear normal.     Nose: No congestion or rhinorrhea.  Eyes:     Extraocular Movements: Extraocular movements intact.     Pupils: Pupils are equal, round, and reactive to light.  Cardiovascular:     Rate and Rhythm: Normal rate and regular rhythm.     Pulses: Normal pulses.     Heart sounds: Normal heart sounds. No murmur heard.   Pulmonary:     Effort: Pulmonary effort is normal. No respiratory distress.     Breath sounds: Normal breath sounds. No wheezing, rhonchi or rales.  Abdominal:      General: Bowel sounds are normal.     Palpations: Abdomen is soft. There is no mass.     Tenderness: There is no abdominal tenderness. There is no guarding.     Hernia: No hernia is present.  Musculoskeletal:        General: Normal range of motion.     Cervical back: Normal range of motion and neck supple.  Skin:    General: Skin is warm and dry.  Neurological:     Mental Status: He is alert and oriented to person, place, and time.  Psychiatric:        Behavior: Behavior normal.    BP 110/72   Pulse 92   Temp 98 F (36.7 C)   Resp 16   SpO2 97%  Wt Readings from Last 3 Encounters:  05/28/20 (!) 92 lb 3.2 oz (41.8 kg)  03/28/20 92 lb (41.7 kg)  08/30/19 105 lb (47.6 kg)     Lab Results  Component Value Date   WBC 6.7 09/09/2020   HGB 15.9 09/09/2020   HCT 47.6 09/09/2020   PLT 185.0 09/09/2020   GLUCOSE 80 09/09/2020   CHOL 131 09/09/2020   TRIG 84.0 09/09/2020   HDL 35.80 (L) 09/09/2020   LDLCALC 78 09/09/2020   ALT 20 09/09/2020   AST 18 09/09/2020   NA 141 09/09/2020   K 3.9 09/09/2020   CL 102 09/09/2020   CREATININE 0.29 (L) 09/09/2020   BUN 8 09/09/2020   CO2 29 09/09/2020   TSH 2.14 09/09/2020   HGBA1C 5.0 04/29/2007    IR GJ Tube Change  Result Date: 02/27/2021 INDICATION: 34 year old with a chronic GJ feeding tube. Patient presents for routine exchange. EXAM: GJ FEEDING TUBE EXCHANGE WITH FLUOROSCOPY MEDICATIONS: None ANESTHESIA/SEDATION: None CONTRAST:  15 mL Omnipaque 300-administered into the gastric lumen. FLUOROSCOPY TIME:  Fluoroscopy Time: 5 minutes, 48 seconds, 39 mGy COMPLICATIONS: None immediate. PROCEDURE: Patient was placed supine on the fluoroscopic table. The existing tube and surrounding skin were prepped and draped in sterile fashion. Maximal barrier sterile technique was utilized including caps, mask, sterile gowns, sterile gloves, sterile drape, hand hygiene and skin antiseptic. Contrast injection confirmed that the feeding tube was in  the proximal jejunum. The balloon was deflated and the catheter was partially pulled out. Catheter was cut and stiff Glidewire was placed. Old catheter was removed over the wire. A new 24 French GJ feeding tube was cut to a similar length as the olf tube. The tube was advanced over the wire. Unfortunately, the tube coiled into the stomach and eventually access was completely lost. 65 cm, 5 [place] Kumpe catheter was used to advance a Roadrunner wire into the stomach and duodenum. Wire was placed in the proximal jejunum. 5 French catheter was removed over the wire  a nd the 67 [place] GJ feeding tube was successfully advanced over the wire. Catheter tip was coiled in the distal duodenum. Balloon was inflated with approximately 6 mL of very dilute contrast. Contrast injection was performed through the G and J lumens. FINDINGS: Tip of the new tube is coiled in the distal duodenum. IMPRESSION: Successful exchange of the GJ feeding tube. Catheter tip is in the distal duodenum. Both lumens are ready for use. Electronically Signed   By: Markus Daft M.D.   On: 02/27/2021 17:49     Assessment & Plan:  Plan    No orders of the defined types were placed in this encounter.   Problem List Items Addressed This Visit    Infantile cerebral palsy ([place])    He has been refusing his botox because the shots hurt but his spasms and contractures are getting worse and it is making it harder to be comfortable and for his mom to manage him he is encouraged to restart Botox injections      GASTROSTOMY COMPLICATION   Gastrostomy in place [facility])    He just had his gj tube replaced at [facility]. He is tolerating his tube feeding but only getting around 1100 kcal so they are going to reach out to nutrition and see if they can up his caloric intake.       Protein-calorie malnutrition, severe ([place])    His weight has remained stable over the past few months and his feedings are going well but they are encouraged to increase his caloric  intake.      Hyperlipidemia, mild    Encouraged heart healthy diet, increase exercise, avoid trans fats, consider a krill oil cap daily         Follow-up: Return in about 6 months (around 09/06/2021) for annual exam.   I,David Hanna,acting as a scribe for [name], MD.,have documented all relevant documentation on the behalf of [name], MD,as directed by  [name], MD while in the presence of [name], MD.  I, [name], MD personally performed the services described in this documentation. All medical record entries made by the scribe were at my direction and in my presence. I have reviewed the chart and agree that the record reflects my personal performance and is accurate and complete

## 2021-03-06 NOTE — Patient Instructions (Signed)
[email or web address]">  Heartburn Heartburn is a type of pain or discomfort that can happen in the throat or chest. It is often described as a burning pain. It may also cause a bad, acid-like taste in the mouth. Heartburn may feel worse when you lie down or bend over, and it is often worse at night. Heartburn may be caused by stomach contents that move back up into the esophagus (reflux). Follow these instructions at home: Eating and drinking  Avoid certain foods and drinks as told by your health care provider. This may include: ? Coffee and tea, with or without caffeine. ? Drinks that contain alcohol. ? Energy drinks and sports drinks. ? Carbonated drinks or sodas. ? Chocolate and cocoa. ? Peppermint and mint flavorings. ? Garlic and onions. ? Horseradish. ? Spicy and acidic foods, including peppers, chili powder, curry powder, vinegar, hot sauces, and barbecue sauce. ? Citrus fruit juices and citrus fruits, such as oranges, lemons, and limes. ? Tomato-based foods, such as red sauce, chili, salsa, and pizza with red sauce. ? Fried and fatty foods, such as donuts, french fries, potato chips, and high-fat dressings. ? High-fat meats, such as hot dogs and fatty cuts of red and white meats, such as rib eye steak, sausage, ham, and bacon. ? High-fat dairy items, such as whole milk, butter, and cream cheese.  Eat small, frequent meals instead of large meals.  Avoid drinking large amounts of liquid with your meals.  Avoid eating meals during the 2-3 hours before bedtime.  Avoid lying down right after you eat.  Do not exercise right after you eat.   Lifestyle  If you are overweight, reduce your weight to an amount that is healthy for you. Ask your health care provider for guidance about a safe weight loss goal.  Do not use any products that contain nicotine or tobacco. These products include cigarettes, chewing tobacco, and vaping devices, such as  e-cigarettes. These can make symptoms worse. If you need help quitting, ask your health care provider.  Wear loose-fitting clothing. Do not wear anything tight around your waist that causes pressure on your abdomen.  Raise (elevate) the head of your bed about 6 inches (15 cm) when you sleep. You can use a wedge to do this.  Try to reduce your stress, such as with yoga or meditation. If you need help reducing stress, ask your health care provider.      Medicines  Take over-the-counter and prescription medicines only as told by your health care provider.  Do not take aspirin or NSAIDs, such as ibuprofen, unless your health care provider told you to do so.  Stop medicines only as told by your health care provider. If you stop taking some medicines too quickly, your symptoms may get worse. General instructions  Pay attention to any changes in your symptoms.  Keep all follow-up visits. This is important. Contact a health care provider if:  You have new symptoms.  You have unexplained weight loss.  You have difficulty swallowing, or it hurts to swallow.  You have wheezing or a persistent cough.  Your symptoms do not improve with treatment.  You have frequent heartburn for more than 2 weeks. Get help right away if:  You suddenly have pain in your arms, neck, jaw, teeth, or back.  You suddenly feel sweaty, dizzy, or light-headed.  You have chest pain or shortness of breath.  You vomit and your vomit looks like blood or coffee grounds.  Your stool is bloody or black.   These symptoms may represent a serious problem that is an emergency. Do not wait to see if the symptoms will go away. Get medical help right away. Call your local emergency services (911 in the [place] Do not drive yourself to the hospital. Summary  Heartburn is a type of pain or discomfort that can happen in the throat or chest. It is often described as a burning pain. It may also cause a bad, acid-like taste in  the mouth.  Avoid certain foods and drinks as told by your health care provider.  Take over-the-counter and prescription medicines only as told by your health care provider. Do not take aspirin or NSAIDs, such as ibuprofen, unless your health care provider told you to do so.  Contact a health care provider if your symptoms do not improve or they get worse. This information is not intended to replace advice given to you by your health care provider. Make sure you discuss any questions you have with your health care provider. Document Revised: 04/24/2020 Document Reviewed: 04/24/2020 Elsevier Patient Education  2021 Elsevier Inc.  

## 2021-03-06 NOTE — Assessment & Plan Note (Signed)
Encouraged heart healthy diet, increase exercise, avoid trans fats, consider a krill oil cap daily 

## 2021-03-07 NOTE — Assessment & Plan Note (Signed)
His weight has remained stable over the past few months and his feedings are going well but they are encouraged to increase his caloric intake.

## 2021-03-07 NOTE — Assessment & Plan Note (Signed)
He just had his gj tube replaced at [facility]. He is tolerating his tube feeding but only getting around 1100 kcal so they are going to reach out to nutrition and see if they can up his caloric intake.

## 2021-03-07 NOTE — Assessment & Plan Note (Deleted)
He just had his gj tube replaced at [facility]. He is tolerating his tube feeding but only getting

## 2021-03-07 NOTE — Assessment & Plan Note (Signed)
He has been refusing his botox because the shots hurt but his spasms and contractures are getting worse and it is making it harder to be comfortable and for his mom to manage him he is encouraged to restart Botox injections

## 2021-03-09 ENCOUNTER — Encounter: Payer: Self-pay | Admitting: Family Medicine

## 2021-03-26 ENCOUNTER — Encounter: Payer: Medicare Other | Attending: Physical Medicine & Rehabilitation | Admitting: Physical Medicine & Rehabilitation

## 2021-03-26 ENCOUNTER — Encounter: Payer: Self-pay | Admitting: Physical Medicine & Rehabilitation

## 2021-03-26 ENCOUNTER — Other Ambulatory Visit: Payer: Self-pay

## 2021-03-26 VITALS — BP 124/86 | HR 59 | Temp 98.5°F | Ht 60.0 in | Wt 92.0 lb

## 2021-03-26 DIAGNOSIS — G8250 Quadriplegia, unspecified: Secondary | ICD-10-CM

## 2021-03-26 MED ORDER — BACLOFEN 10 MG PO TABS: 10.0000 mg | ORAL_TABLET | Freq: Two times a day (BID) | ORAL | 3 refills | Status: DC

## 2021-03-26 NOTE — Patient Instructions (Signed)
PLEASE FEEL FREE TO CALL OUR OFFICE WITH ANY PROBLEMS OR QUESTIONS ([phone])      

## 2021-03-26 NOTE — Progress Notes (Signed)
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 This includes bleeding, bruising, infection, excessive weakness, or medication side effects. A REMS form is on file and signed.  EMLA applied for comfort prior to injections for comfort  Needle: 88mm injectable monopolar needle electrode   bilateral Number of units per muscle  Right Quadriceps 200 units,  4 access points Left Quadriceps 200 units, 4 access points Gastroc/soleus 0 units Hamstrings 0 units Tibialis Posterior 0 units Tibialis Anterior 0 units EHL 0 units All injections were done after obtaining appropriate EMG activity and after negative drawback for blood. The patient tolerated the procedure well. Post procedure instructions were given. Return in about 4 months (around 07/27/2021) for bilateral quadriceps botox injections 400 units. Call ahead if you would like to postpone.

## 2021-04-01 ENCOUNTER — Telehealth: Payer: Self-pay | Admitting: Family Medicine

## 2021-04-01 ENCOUNTER — Other Ambulatory Visit: Payer: Self-pay

## 2021-04-01 ENCOUNTER — Telehealth (INDEPENDENT_AMBULATORY_CARE_PROVIDER_SITE_OTHER): Payer: Medicare Other | Admitting: Family Medicine

## 2021-04-01 ENCOUNTER — Encounter: Payer: Self-pay | Admitting: Family Medicine

## 2021-04-01 ENCOUNTER — Telehealth: Payer: Medicare Other | Admitting: Family Medicine

## 2021-04-01 VITALS — BP 99/68 | HR 74 | Temp 98.1°F

## 2021-04-01 DIAGNOSIS — U071 COVID-19: Secondary | ICD-10-CM

## 2021-04-01 DIAGNOSIS — G809 Cerebral palsy, unspecified: Secondary | ICD-10-CM

## 2021-04-01 NOTE — Telephone Encounter (Signed)
Patient scheduled for today at 320pm vv.

## 2021-04-01 NOTE — Telephone Encounter (Signed)
Mom states the patient tested positive for COVID this morning. The patient has a fever, cough, and pulse of 95. Mom informed [name]. They are doing what they normally do due to aspiration pneumonia. Please advise

## 2021-04-01 NOTE — Telephone Encounter (Signed)
Yes set up VV I can get him started on antiviral that way. Could do this afternoon or tomorrow morning

## 2021-04-01 NOTE — Telephone Encounter (Signed)
Left message on machine to call back  Can schedule for virtual later today or tomorrow morning.

## 2021-04-01 NOTE — Telephone Encounter (Signed)
Ill be happy to set up virtual visit but you usually see him most of time.

## 2021-04-02 ENCOUNTER — Ambulatory Visit (INDEPENDENT_AMBULATORY_CARE_PROVIDER_SITE_OTHER): Payer: Medicare Other

## 2021-04-02 DIAGNOSIS — U071 COVID-19: Secondary | ICD-10-CM | POA: Diagnosis not present

## 2021-04-02 MED ORDER — EPINEPHRINE 0.3 MG/0.3ML IJ SOAJ
0.3000 mg | Freq: Once | INTRAMUSCULAR | Status: AC | PRN
Start: 2021-04-02 — End: 2021-04-02

## 2021-04-02 MED ORDER — METHYLPREDNISOLONE SODIUM SUCC 125 MG IJ SOLR: 125.0000 mg | Freq: Once | INTRAMUSCULAR | Status: AC | PRN

## 2021-04-02 MED ORDER — FAMOTIDINE IN NACL 20-0.9 MG/50ML-% IV SOLN: 20.0000 mg | Freq: Once | INTRAVENOUS | Status: AC | PRN

## 2021-04-02 MED ORDER — SODIUM CHLORIDE 0.9 % IV SOLN
INTRAVENOUS | Status: DC | PRN
Start: 2021-04-02 — End: 2021-05-25

## 2021-04-02 MED ORDER — DIPHENHYDRAMINE HCL 50 MG/ML IJ SOLN: 50.0000 mg | Freq: Once | INTRAMUSCULAR | Status: AC | PRN

## 2021-04-02 MED ORDER — BEBTELOVIMAB 175 MG/2 ML IV (EUA)
175.0000 mg | Freq: Once | INTRAMUSCULAR | Status: AC
  Administered 2021-04-02: 175 mg via INTRAVENOUS

## 2021-04-02 MED ORDER — ALBUTEROL SULFATE HFA 108 (90 BASE) MCG/ACT IN AERS: 2.0000 | INHALATION_SPRAY | Freq: Once | RESPIRATORY_TRACT | Status: AC | PRN

## 2021-04-02 NOTE — Assessment & Plan Note (Signed)
With quadriplegia and NPO. Is referred for MAB infusion

## 2021-04-02 NOTE — Patient Instructions (Addendum)
10 Things You Can Do to Manage Your COVID-19 Symptoms at Home If you have possible or confirmed COVID-19: 1. Stay home except to get medical care. 2. Monitor your symptoms carefully. If your symptoms get worse, call your healthcare provider immediately. 3. Get rest and stay hydrated. 4. If you have a medical appointment, call the healthcare provider ahead of time and tell them that you have or may have COVID-19. 5. For medical emergencies, call 911 and notify the dispatch personnel that you have or may have COVID-19. 6. Cover your cough and sneezes with a tissue or use the inside of your elbow. 7. Wash your hands often with soap and water for at least 20 seconds or clean your hands with an alcohol-based hand sanitizer that contains at least 60% alcohol. 8. As much as possible, stay in a specific room and away from other people in your home. Also, you should use a separate bathroom, if available. If you need to be around other people in or outside of the home, wear a mask. 9. Avoid sharing personal items with other people in your household, like dishes, towels, and bedding. 10. Clean all surfaces that are touched often, like counters, tabletops, and doorknobs. Use household cleaning sprays or wipes according to the label instructions. cdc.gov/coronavirus 05/17/2020 This information is not intended to replace advice given to you by your health care provider. Make sure you discuss any questions you have with your health care provider. Document Revised: 09/02/2020 Document Reviewed: 09/02/2020 Elsevier Patient Education  2021 Elsevier Inc.  What types of side effects do monoclonal antibody drugs cause?  Common side effects  In general, the more common side effects caused by monoclonal antibody drugs include: . Allergic reactions, such as hives or itching . Flu-like signs and symptoms, including chills, fatigue, fever, and muscle aches and pains . Nausea, vomiting . Diarrhea . Skin  rashes . Low blood pressure   The CDC is recommending patients who receive monoclonal antibody treatments wait at least 90 days before being vaccinated.  Currently, there are no data on the safety and efficacy of mRNA COVID-19 vaccines in persons who received monoclonal antibodies or convalescent plasma as part of COVID-19 treatment. Based on the estimated half-life of such therapies as well as evidence suggesting that reinfection is uncommon in the 90 days after initial infection, vaccination should be deferred for at least 90 days, as a precautionary measure until additional information becomes available, to avoid interference of the antibody treatment with vaccine-induced immune responses.  

## 2021-04-02 NOTE — Progress Notes (Signed)
Diagnosis: COVID  Provider:  Marshell Garfinkel, MD  Procedure: Infusion  IV Type: Peripheral, IV Location: R Forearm  Bebtelovimab, Dose: 175 mg  Infusion Start Time: 9323  Infusion Stop Time: 1356  Post Infusion IV Care: Observation period completed and Peripheral IV Discontinued  Discharge: Condition: Good, Destination: Home . AVS provided to patient.   Performed by:  Koren Shiver, RN

## 2021-04-02 NOTE — Progress Notes (Signed)
MyChart Video Visit    Virtual Visit via Video Note   This visit type was conducted due to national recommendations for restrictions regarding the COVID-19 Pandemic (e.g. social distancing) in an effort to limit this patient's exposure and mitigate transmission in our community. This patient is at least at moderate risk for complications without adequate follow up. This format is felt to be most appropriate for this patient at this time. Physical exam was limited by quality of the video and audio technology used for the visit. [name], CMA was able to get the patient set up on a video visit.  Patient location: home Patient and mother and provider in visit Provider location: Office  I discussed the limitations of evaluation and management by telemedicine and the availability of in person appointments. The patient expressed understanding and agreed to proceed.  Visit Date: 04/01/2021  Today's healthcare provider: [name], MD     Subjective:    Patient ID: [name], male    DOB: 04-Dec-1985, 35 y.o.   MRN: [id number]  Chief Complaint  Patient presents with  . Fever  . Cough  . postitive home covid test    HPI Patient is in today for evaluation of positive COVID 19 test. His mother provides most of the history although patient is able to time and make it clear he does not want to go to the hospital during the conversation.  He became ill on Mar 31, 2021 with a fever to a T-max of 103.  He has cough and congestion.  They tested him this morning for COVID and the test was positive.  His temperature is down today and he is resting comfortably.  His oxygen status is in the high 90s.  No other acute complaints no GI concerns, increasing shortness of breath noted. No CP/palp attested to. Patient lying in bed and listening intently to the conversation and in no obvious distress. Due to his CP with quadriplegia and NPO status he is unable to swallow pills and is at high risk  of severe COVID illness.    Past Medical History:  Diagnosis Date  . Cerebral palsy ([place])   . Dehydration 11/22/2013  . Depression with anxiety 08/01/2010   Qualifier: Diagnosis of  By: Nelson-Smith CMA (AAMA), Dottie    . Dyslipidemia 08/19/2017  . Esophagitis 2011  . Gastrostomy in place [facility]) 08/31/2013  . GERD (gastroesophageal reflux disease)   . Hyperlipidemia, mild 08/25/2015  . Hyperthyroidism   . Incontinence of feces   . Loss of weight 08/28/2014  . Medicare annual wellness visit, subsequent 08/25/2015  . Mildly underweight adult 03/16/2017  . Palpitations   . Skin lesion of right ear 03/16/2017  . Thyroid disease 08/01/2010   Qualifier: Diagnosis of  By: Harlon Ditty CMA (AAMA), Dottie      Past Surgical History:  Procedure Laterality Date  . baclofen trial    . baslofen pump implant    . ears tubes    . EYE SURGERY    . FLEXIBLE SIGMOIDOSCOPY N/A 09/07/2014   Procedure: FLEXIBLE SIGMOIDOSCOPY;  Surgeon: [name], MD;  Location: [facility] ENDOSCOPY;  Service: Endoscopy;  Laterality: N/A;  . g-tube insert  August 2006  . hamstring released     to treat contractures.   Marland Kitchen HIP SURGERY     x2 , side   . IR CM INJ ANY COLONIC TUBE W/FLUORO  05/28/2017  . IR CM INJ ANY COLONIC TUBE W/FLUORO  07/07/2019  . IR  CM INJ ANY COLONIC TUBE W/FLUORO  09/18/2019  . IR GASTR TUBE CONVERT GASTR-JEJ PER W/FL MOD SED  03/14/2019  . IR GENERIC HISTORICAL  07/01/2016   IR GASTR TUBE CONVERT GASTR-JEJ PER W/FL MOD SED 07/01/2016 [name], MD [facility]-INTERV RAD  . IR GENERIC HISTORICAL  07/08/2016   IR PATIENT EVAL TECH 0-60 MINS 07/08/2016 [name], MD [facility]  . IR GENERIC HISTORICAL  07/14/2016   IR GJ TUBE CHANGE 07/14/2016 [name], MD [facility]  . IR GENERIC HISTORICAL  07/21/2016   IR PATIENT EVAL TECH 0-60 MINS [facility]  . IR GENERIC HISTORICAL  08/31/2016   IR [place] DUODEN/JEJUNO TUBE PERCUT W/FLUORO 08/31/2016 [name], MD [facility]  . IR GENERIC HISTORICAL   09/03/2016   IR GJ TUBE CHANGE 09/03/2016 [name], MD [facility]  . IR GENERIC HISTORICAL  09/10/2016   IR GASTR TUBE CONVERT GASTR-JEJ PER W/FL MOD SED 09/10/2016 [facility]  . IR GENERIC HISTORICAL  09/16/2016   IR PATIENT EVAL TECH 0-60 MINS [facility]  . IR GENERIC HISTORICAL  09/29/2016   IR GJ TUBE CHANGE 09/29/2016 [name], MD [facility]  . IR GJ TUBE CHANGE  02/05/2017  . IR GJ TUBE CHANGE  05/21/2017  . IR GJ TUBE CHANGE  08/25/2017  . IR GJ TUBE CHANGE  01/18/2018  . IR GJ TUBE CHANGE  02/04/2018  . IR GJ TUBE CHANGE  04/21/2018  . IR GJ TUBE CHANGE  08/18/2018  . IR GJ TUBE CHANGE  09/12/2019  . IR GJ TUBE CHANGE  01/03/2020  . IR GJ TUBE CHANGE  05/13/2020  . IR GJ TUBE CHANGE  09/11/2020  . IR GJ TUBE CHANGE  02/27/2021  . IR MECH REMOV OBSTRUC MAT ANY COLON TUBE W/FLUORO  09/02/2018  . IR REPLC GASTRO/COLONIC TUBE PERCUT W/FLUORO  10/17/2018  . PEG PLACEMENT  10/21/2011   Procedure: PERCUTANEOUS ENDOSCOPIC GASTROSTOMY (PEG) REPLACEMENT;  Surgeon: [name], MD;  Location: [facility] ENDOSCOPY;  Service: Endoscopy;  Laterality: N/A;  . PEG PLACEMENT N/A 06/13/2013   Procedure: PERCUTANEOUS ENDOSCOPIC GASTROSTOMY (PEG) REPLACEMENT;  Surgeon: [name], MD;  Location: [facility] ENDOSCOPY;  Service: Endoscopy;  Laterality: N/A;  . SPINAL FUSION    . spinal fusion to correct 70 degree kyphosis  11-2010  . spinal fusioncorrect 106 degree kyphosis    . SPINE SURGERY  ,11/20/2010, 2011   for correction of severe contracturing spinal kyphosis.   . TONSILLECTOMY      Family History  Problem Relation Age of Onset  . Asthma Mother   . Hyperlipidemia Mother   . COPD Mother   . Other Mother        bronchial stasis/ABPA  . Cancer Maternal Grandmother 47       breast  . Hyperlipidemia Maternal Grandmother   . Hypertension Maternal Grandmother   . Cancer Maternal Grandfather        prostate  . Heart disease Paternal Grandfather        CHF  . Osteoporosis Paternal Grandmother    . Arthritis Paternal Grandmother        rheumatoid    Social History   Socioeconomic History  . Marital status: Single    Spouse name: Not on file  . Number of children: 0  . Years of education: Not on file  . Highest education level: Not on file  Occupational History  . Occupation: disbaled  Tobacco Use  . Smoking status: Never Smoker  .  Smokeless tobacco: Never Used  Vaping Use  . Vaping Use: Never used  Substance and Sexual Activity  . Alcohol use: No  . Drug use: No  . Sexual activity: Never  Other Topics Concern  . Not on file  Social History Narrative  . Not on file   Social Determinants of Health   Financial Resource Strain: Not on file  Food Insecurity: Not on file  Transportation Needs: Not on file  Physical Activity: Not on file  Stress: Not on file  Social Connections: Not on file  Intimate Partner Violence: Not on file    Outpatient Medications Prior to Visit  Medication Sig Dispense Refill  . AMBULATORY NON FORMULARY MEDICATION Medication Name: MIC gastrostomy/bolus feeding tube 24 French Part number 0110-24. #2 and 10 cc lurer lock syringe #2 Dx: 4 Device 2  . baclofen (LIORESAL) 10 MG tablet Take 1 tablet (10 mg total) by mouth 2 (two) times daily. 1/2 tab twice daily for one week then 1 tab twice daily thereafter 60 tablet 3  . Control Gel Formula Dressing (DUODERM CGF DRESSING) MISC Apply to affected area every other day or as needed. 30 each 0  . dantrolene (DANTRIUM) 50 MG capsule TAKE ONE CAPSULE BY MOUTH EVERY MORNING and TAKE ONE CAPSULE BY MOUTH EVERY EVENING and TAKE ONE CAPSULE BY MOUTH EVERYDAY AT BEDTIME 90 capsule 2  . divalproex (DEPAKOTE SPRINKLE) 125 MG capsule Take 2 capsules in morning, 5 capsules at bedtime (Patient taking differently: 250-625 mg. Take 2 capsules in morning, 5 capsules at bedtime) 210 capsule 4  . Incontinence Supply Disposable (PREVAIL BREEZERS MEDIUM) MISC pkg of 16- size medium 32" to 44"  Breathable cloth-like  outer fabric (can't use the plastic outer surgace  Item # [id number] 16 each 6  . LORazepam (ATIVAN) 1 MG tablet Take one tablet in evenings as needed for insomnia. (Patient taking differently: 1.5 mg. Take and one half tablet in evenings for insomnia.) 30 tablet 1  . Misc. Devices (ALL-BODY MASSAGE) MISC 1 Units/hr by Does not apply route as needed. Full body massage for Muscle spasticity due to Cerebral palsy 99 each 99  . mupirocin ointment (BACTROBAN) 2 % Apply to area thin film twice daily if needed (Patient taking differently: as needed. Apply to area thin film twice daily if needed) 22 g 0  . NON FORMULARY Bard Leg Bag Extension tubing w/Connector 18", Sterile, latex-free  Item# [id number]    . NON FORMULARY Colorplast Freedom Cath Latex Self-Adhering Male External Catheter 46mm Diameter Intermediate  Item# [id number]    . NONFORMULARY OR COMPOUNDED ITEM [employer]ien REF [id number] - [place] Joey Pump Set with Flush Bags - 1000 mL 1 each 0  . Nutritional Supplements (FEEDING SUPPLEMENT, KATE FARMS STANDARD 1.4,) LIQD liquid Take 325 mLs by mouth as directed. 3 carton over 16 hrs    . OLANZapine (ZYPREXA) 5 MG tablet Take by mouth.    Earney Navy Bicarbonate (ZEGERID) 20-1100 MG CAPS capsule Take 1 capsule by mouth daily before breakfast.    . Ostomy Supplies (PROTECTIVE BARRIER WIPES) MISC 1-1/4" X 3"  Item #[id number] 75 each 6  . oxybutynin (DITROPAN) 5 MG/5ML syrup Take 5 mg by mouth 2 (two) times daily.    Marland Kitchen PARoxetine (PAXIL) 10 MG tablet Take 15 mg by mouth daily.    . sucralfate (CARAFATE) 1 g tablet TAKE 1 TABLET BY MOUTH 4 TIMES DAILY - WITH MEALS AND AT BEDTIME. (Patient taking differently: as needed.) 120 tablet 1   No  facility-administered medications prior to visit.    Allergies  Allergen Reactions  . Ambien [Zolpidem Tartrate] Nausea Only  . Antihistamines, Chlorpheniramine-Type     Other reaction(s): Other (See Comments) Other Reaction: agitation  . Augmentin  [Amoxicillin-Pot Clavulanate] Diarrhea  . Codeine Other (See Comments)    Makes patient too active after a few days.  . Metoclopramide Other (See Comments)    Delusion, emotionality   . Pheniramine Rash    Other reaction(s): Other (See Comments) Other Reaction: agitation  . Sulfa Antibiotics Rash  . Sulfonamide Derivatives Rash    Review of Systems  Constitutional: Positive for chills, fever and malaise/fatigue.  HENT: Positive for congestion.   Eyes: Negative for blurred vision.  Respiratory: Positive for cough. Negative for shortness of breath.   Cardiovascular: Negative for chest pain, palpitations and leg swelling.  Gastrointestinal: Negative for abdominal pain, blood in stool and nausea.  Genitourinary: Negative for dysuria and frequency.  Musculoskeletal: Positive for myalgias. Negative for falls.  Skin: Negative for rash.  Neurological: Negative for dizziness, loss of consciousness and headaches.  Endo/Heme/Allergies: Negative for environmental allergies.  Psychiatric/Behavioral: Negative for depression. The patient is not nervous/anxious.        Objective:    Physical Exam Constitutional:      General: He is not in acute distress.    Appearance: He is ill-appearing.  HENT:     Head: Normocephalic and atraumatic.     Nose: Nose normal.  Neurological:     Comments: Contractures all four extremities     BP 99/68   Pulse 74   Temp 98.1 F (36.7 C)   SpO2 99%  Wt Readings from Last 3 Encounters:  03/26/21 92 lb (41.7 kg)  05/28/20 (!) 92 lb 3.2 oz (41.8 kg)  03/28/20 92 lb (41.7 kg)    Diabetic Foot Exam - Simple   No data filed    Lab Results  Component Value Date   WBC 6.7 09/09/2020   HGB 15.9 09/09/2020   HCT 47.6 09/09/2020   PLT 185.0 09/09/2020   GLUCOSE 80 09/09/2020   CHOL 131 09/09/2020   TRIG 84.0 09/09/2020   HDL 35.80 (L) 09/09/2020   LDLCALC 78 09/09/2020   ALT 20 09/09/2020   AST 18 09/09/2020   NA 141 09/09/2020   K 3.9  09/09/2020   CL 102 09/09/2020   CREATININE 0.29 (L) 09/09/2020   BUN 8 09/09/2020   CO2 29 09/09/2020   TSH 2.14 09/09/2020   HGBA1C 5.0 04/29/2007    Lab Results  Component Value Date   TSH 2.14 09/09/2020   Lab Results  Component Value Date   WBC 6.7 09/09/2020   HGB 15.9 09/09/2020   HCT 47.6 09/09/2020   MCV 94.5 09/09/2020   PLT 185.0 09/09/2020   Lab Results  Component Value Date   NA 141 09/09/2020   K 3.9 09/09/2020   CO2 29 09/09/2020   GLUCOSE 80 09/09/2020   BUN 8 09/09/2020   CREATININE 0.29 (L) 09/09/2020   BILITOT 0.4 09/09/2020   ALKPHOS 79 09/09/2020   AST 18 09/09/2020   ALT 20 09/09/2020   PROT 7.2 09/09/2020   ALBUMIN 4.6 09/09/2020   CALCIUM 9.3 09/09/2020   ANIONGAP 11 09/18/2014   GFR 154.71 09/09/2020   Lab Results  Component Value Date   CHOL 131 09/09/2020   Lab Results  Component Value Date   HDL 35.80 (L) 09/09/2020   Lab Results  Component Value Date   LDLCALC 78  09/09/2020   Lab Results  Component Value Date   TRIG 84.0 09/09/2020   Lab Results  Component Value Date   CHOLHDL 4 09/09/2020   Lab Results  Component Value Date   HGBA1C 5.0 04/29/2007       Assessment & Plan:   Problem List Items Addressed This Visit    Infantile cerebral palsy ([place])    With quadriplegia and NPO. Is referred for MAB infusion      COVID-19 - Primary    Patient and mother report congestion and a temperature as high as 103 starting on 03/31/21. Tested positive for covid on 5/31. Patient is NPO due to quadriplegia from CP and dysphagia so he requires MAB infusion. Orders placed. Treat fevers monitor O2 status and seek care if he worsens. Spent 35 minutes.      Relevant Orders   Ambulatory referral for Covid Treatment      I am having Esteban S. Schamberger "[name]" maintain his divalproex, Omeprazole-Sodium Bicarbonate, NON FORMULARY, NON FORMULARY, Protective Barrier Wipes, Prevail Breezers Medium, NONFORMULARY OR COMPOUNDED ITEM,  LORazepam, PARoxetine, AMBULATORY NON FORMULARY MEDICATION, mupirocin ointment, All-Body Massage, sucralfate, feeding supplement (KATE FARMS STANDARD 1.4), DuoDERM CGF Dressing, dantrolene, OLANZapine, oxybutynin, and baclofen.  No orders of the defined types were placed in this encounter.   I discussed the assessment and treatment plan with the patient. The patient was provided an opportunity to ask questions and all were answered. The patient agreed with the plan and demonstrated an understanding of the instructions.   The patient was advised to call back or seek an in-person evaluation if the symptoms worsen or if the condition fails to improve as anticipated.  I provided 35 minutes of face-to-face time during this encounter.   [name], MD [place] [facility] [phone] (phone) [phone] (fax)  [place]

## 2021-04-02 NOTE — Assessment & Plan Note (Addendum)
Patient and mother report congestion and a temperature as high as 103 starting on 03/31/21. Tested positive for covid on 5/31. Patient is NPO due to quadriplegia from CP and dysphagia so he requires MAB infusion. Orders placed. Treat fevers monitor O2 status and seek care if he worsens. Spent 35 minutes.

## 2021-04-03 ENCOUNTER — Telehealth: Payer: Medicare Other | Admitting: Family Medicine

## 2021-04-03 ENCOUNTER — Ambulatory Visit: Payer: Medicare Other

## 2021-04-07 ENCOUNTER — Other Ambulatory Visit: Payer: Self-pay | Admitting: Physical Medicine & Rehabilitation

## 2021-04-07 DIAGNOSIS — G8250 Quadriplegia, unspecified: Secondary | ICD-10-CM

## 2021-04-07 DIAGNOSIS — G809 Cerebral palsy, unspecified: Secondary | ICD-10-CM

## 2021-04-17 IMAGING — XA IR REPLACE G/J TUBE W/ FLUORO
5 series · 18 of 20 positions shown · non-contrast
Comparison: Fluoroscopic guided gastrojejunostomy catheter
exchange-05/13/2020

INDICATION: History of cerebral palsy with chronic percutaneous
gastrojejunostomy catheter. Patient presents today for routine
fluoroscopic guided exchange.

EXAM:
FLUOROSCOPIC GUIDED REPLACEMENT OF GASTROJEJUNOSTOMY TUBE

[Series 1: fl - angio · 4 of 19 frames shown (1 of 5)]
[frame 3/19]
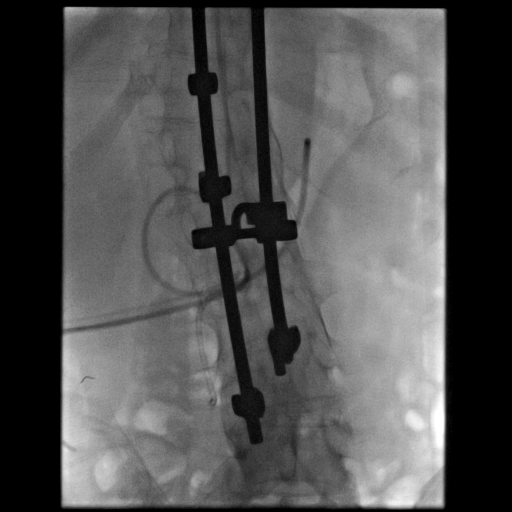
[frame 7/19]
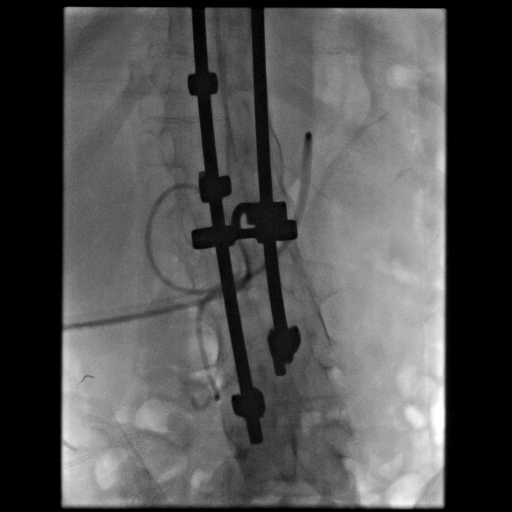
[frame 10/19]
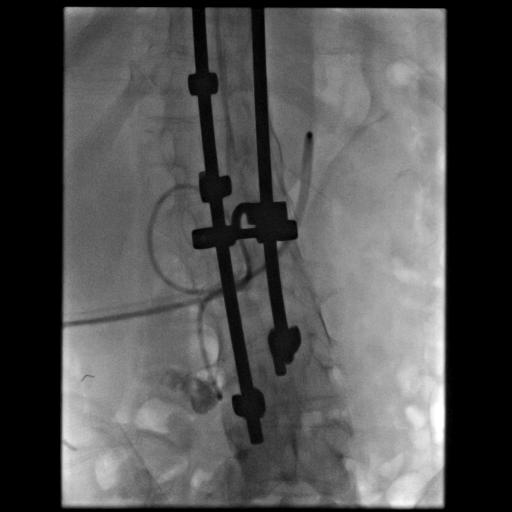
[frame 17/19]
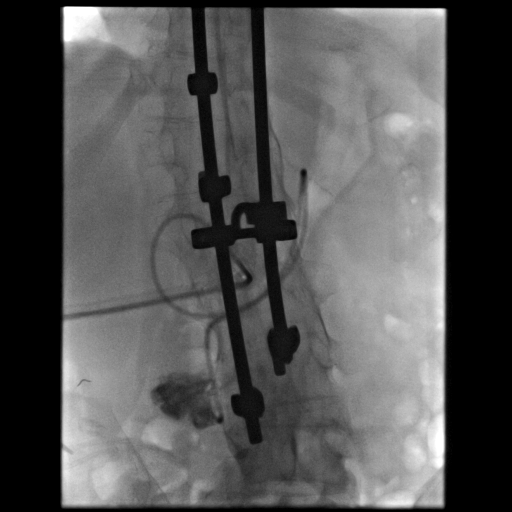

[Series 2: fl - angio · 3 of 17 frames shown (2 of 5)]
[frame 3/17]
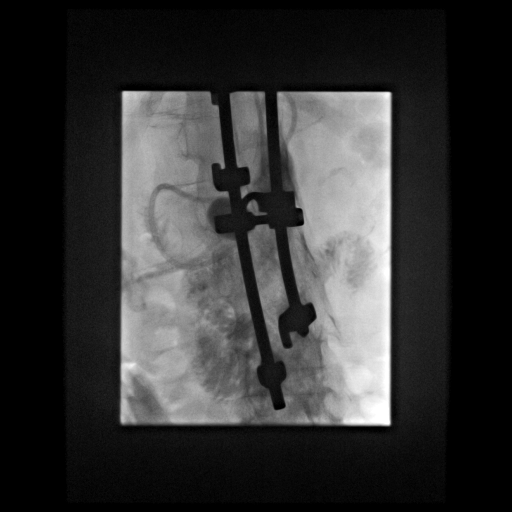
[frame 9/17]
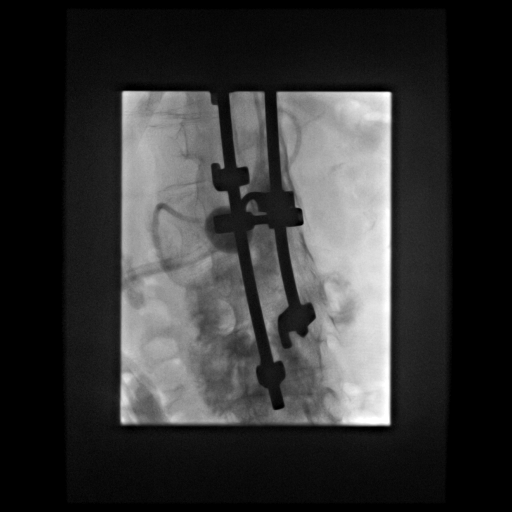
[frame 15/17]
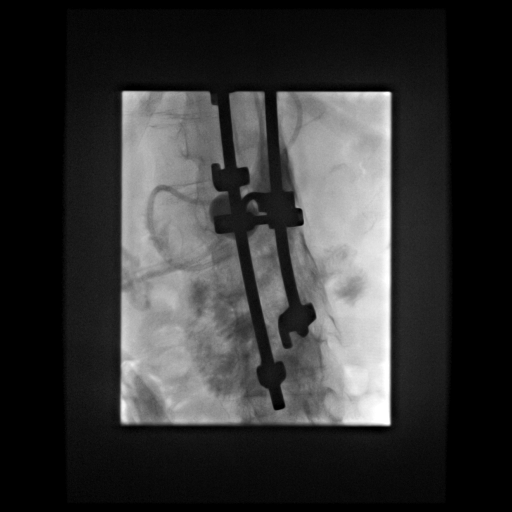

[Series 3: fl - angio · 4 of 44 frames shown (3 of 5)]
[frame 3/44]
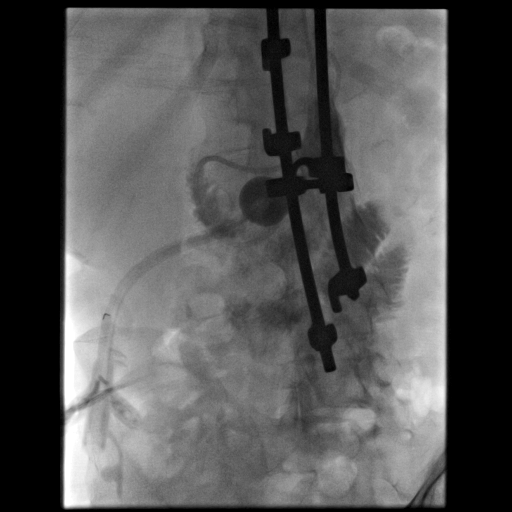
[frame 7/44]
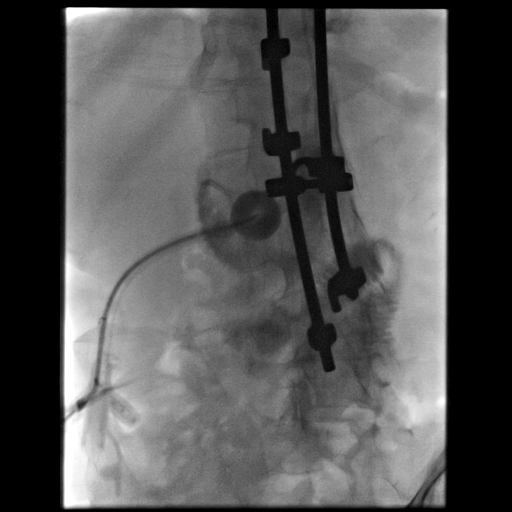
[frame 23/44]
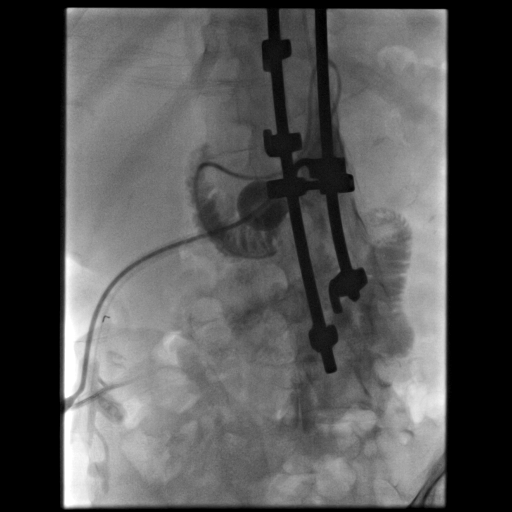
[frame 38/44]
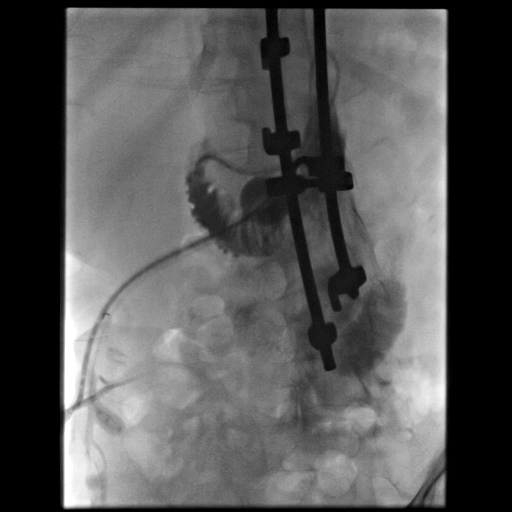

[Series 4: fl - angio · 3 of 22 frames shown (4 of 5)]
[frame 4/22]
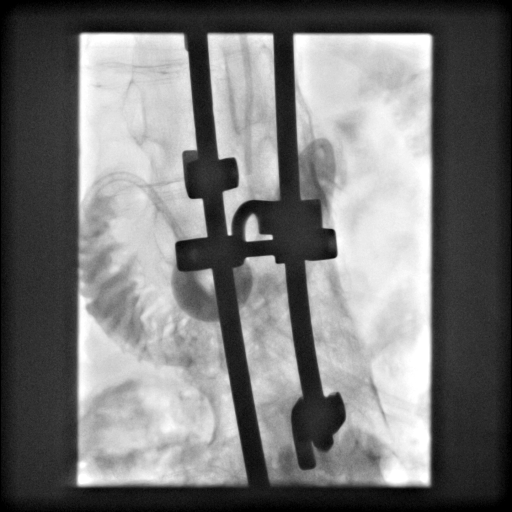
[frame 5/22]
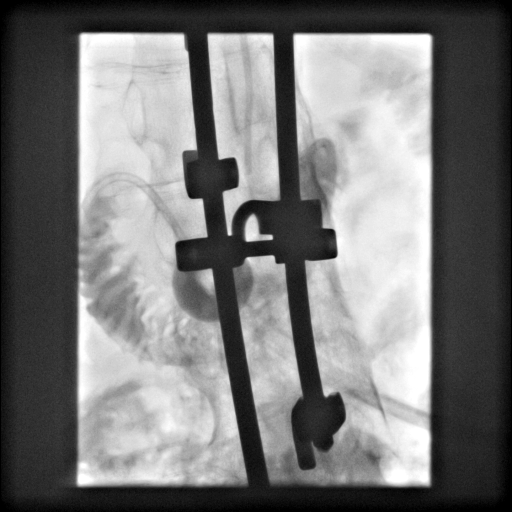
[frame 19/22]
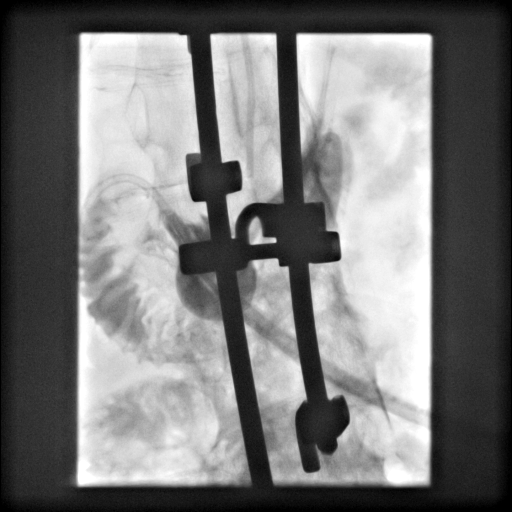

[Series 5: fl - angio · 4 of 30 frames shown (5 of 5)]
[frame 5/30]
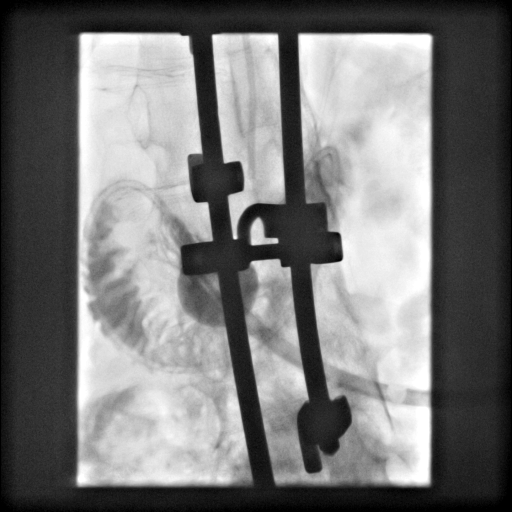
[frame 16/30]
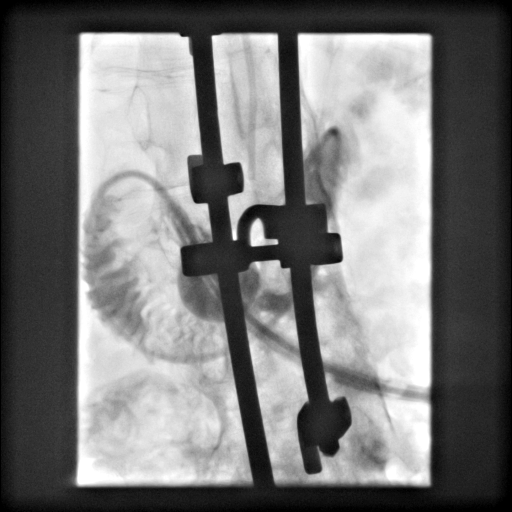
[frame 23/30]
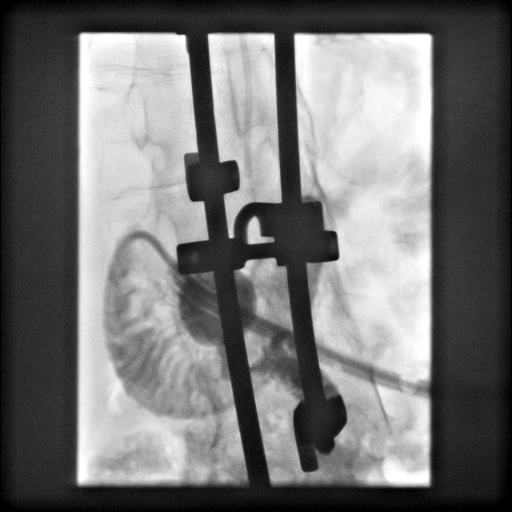
[frame 26/30]
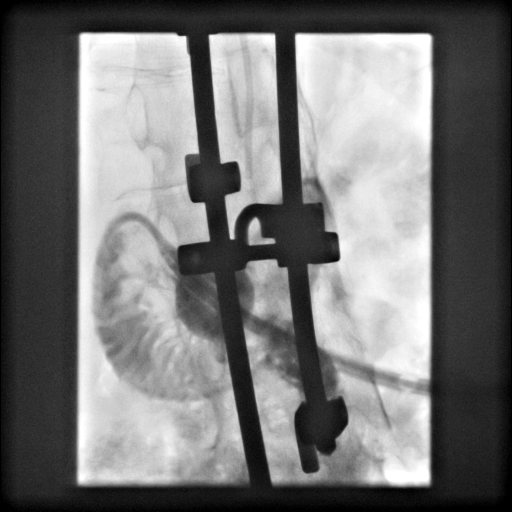

[18 of 20 positions shown; findings below may reference images not displayed]

MEDICATIONS:
None.

CONTRAST:  35 cc Omnipaque the stomach 300, administered into and
proximal small bowel.

FLUOROSCOPY TIME:  6 minutes, 48 seconds (112 mGy).

COMPLICATIONS:
None.

PROCEDURE:
The upper abdomen and the external portion of the existing
gastrojejunostomy tube was prepped and draped in the usual sterile
fashion, and a sterile drape was applied covering the operative
field. Maximum barrier sterile technique with sterile gowns and
gloves were used for the procedure. A timeout was performed prior to
the initiation of the procedure.

The external portion of the 24 French balloon retention GJ tube was
cut. Attempts were made to cannulate the jejunal lumen with a stiff
Glidewire with ultimately proved unsuccessful

As such, the existing GJ tube was partially retracted and the
jejunal lumen was cut and cannulated with a stiff Glidewire which
was advanced through the entirety of the remaining portion of the
jejunal limb and coiled within the proximal small bowel.

Under intermittent fluoroscopic guidance, the remaining jejunal limb
catheter tubing was exchanged for a new 24 French balloon retention
GJ tube however the tube inadvertently reflux into the gastric lumen

As such, the new GJ tube was removed. The gastrostomy site with can
with a Kumpe catheter which was utilized to manipulate a stiff
Glidewire to the level of the ligament of Treitz. Contrast injection
confirmed appropriate positioning

Next, under intermittent fluoroscopic guidance, the Kumpe catheter
was exchanged for a new 24 French balloon retention GJ tube with the
distal approximately half which trimmed as was done previously. The
retention balloon was inflated with approximately 10 cc of saline
and dilute contrast and the external disc was cinched.

Contrast injection demonstrated appropriate positioning and
functionality of both the gastric and jejunal lumens. Postprocedural
spot fluoroscopic image was obtained. A dressing was applied. The
patient tolerated the procedure well without immediate
postprocedural complication.
IMPRESSION: Successful fluoroscopic guided exchange of chronic balloon retention
24 French GJ tube with tip terminating within the proximal jejunum.
Both lumens ready for immediate use.

## 2021-05-13 DIAGNOSIS — F339 Major depressive disorder, recurrent, unspecified: Secondary | ICD-10-CM | POA: Diagnosis not present

## 2021-05-20 ENCOUNTER — Encounter: Payer: Self-pay | Admitting: Family Medicine

## 2021-05-22 NOTE — Telephone Encounter (Signed)
Patient mother called and they would like to do a virtual on Friday.  What time would you like for me to me them in.

## 2021-05-23 ENCOUNTER — Telehealth (INDEPENDENT_AMBULATORY_CARE_PROVIDER_SITE_OTHER): Payer: Medicare Other | Admitting: Family Medicine

## 2021-05-23 ENCOUNTER — Other Ambulatory Visit: Payer: Self-pay

## 2021-05-23 DIAGNOSIS — U071 COVID-19: Secondary | ICD-10-CM | POA: Diagnosis not present

## 2021-05-23 DIAGNOSIS — G808 Other cerebral palsy: Secondary | ICD-10-CM

## 2021-05-25 NOTE — Assessment & Plan Note (Signed)
He has fully recovered from [place] and they do not feel he has any residual concerns.

## 2021-05-25 NOTE — Assessment & Plan Note (Signed)
He struggles with communication but uses a communication board effectively but he needs a rote column scanning set up due to his quadriplegia. He needs his orders rewritten to get his board in process. This visit confirms he is still in need of the board and a good candidate. He has worked with [employer] for years.  He is also waiting on his new power wheelchair due to supply chain issues but that process is till moving forward.

## 2021-05-25 NOTE — Progress Notes (Signed)
MyChart Video Visit    Virtual Visit via Video Note   This visit type was conducted due to national recommendations for restrictions regarding the COVID-19 Pandemic (e.g. social distancing) in an effort to limit this patient's exposure and mitigate transmission in our community. This patient is at least at moderate risk for complications without adequate follow up. This format is felt to be most appropriate for this patient at this time. Physical exam was limited by quality of the video and audio technology used for the visit. [name], [name] was able to get the patient set up on a video visit.  Patient location: home Patient and provider in visit Provider location: Office  I discussed the limitations of evaluation and management by telemedicine and the availability of in person appointments. The patient expressed understanding and agreed to proceed.  Visit Date: 05/23/2021  Today's healthcare provider: [name], [name]     Subjective:    Patient ID: [name], male    DOB: June 11, 1986, 35 y.o.   MRN: [id number]  Chief Complaint  Patient presents with   follow up dme equipment    HPI Patient is in today for reevaluation of need for a new [employer] communication board. We had filled out the paperwork previously but there was a glitch at the [employer] level and they need the process completed again. He struggles with communication but uses a communication board effectively but he needs a rote column scanning set up due to his quadriplegia. He needs his orders rewritten to get his board in process. This visit confirms he is still in need of the board and a good candidate. He has worked with [employer] for years.  He is also waiting on his new power wheelchair due to supply chain issues but that process is till moving forward. He feels well. They feel he has completely recovered from his bout of COVID.   Past Medical History:  Diagnosis Date   Cerebral palsy ([place])    Dehydration  11/22/2013   Depression with anxiety 08/01/2010   Qualifier: Diagnosis of  By: [name] (AAMA), [name]     Dyslipidemia 08/19/2017   Esophagitis 2011   Gastrostomy in place [facility]) 08/31/2013   GERD (gastroesophageal reflux disease)    Hyperlipidemia, mild 08/25/2015   Hyperthyroidism    Incontinence of feces    Loss of weight 08/28/2014   Medicare annual wellness visit, subsequent 08/25/2015   Mildly underweight adult 03/16/2017   Palpitations    Skin lesion of right ear 03/16/2017   Thyroid disease 08/01/2010   Qualifier: Diagnosis of  By: [name] (AAMA), [name]      Past Surgical History:  Procedure Laterality Date   baclofen trial     baslofen pump implant     ears tubes     EYE SURGERY     FLEXIBLE SIGMOIDOSCOPY N/A 09/07/2014   Procedure: FLEXIBLE SIGMOIDOSCOPY;  Surgeon: [name];  Location: [facility];  Service: Endoscopy;  Laterality: N/A;   g-tube insert  August 2006   hamstring released     to treat contractures.    HIP SURGERY     x2 , side    IR CM INJ ANY COLONIC TUBE W/FLUORO  05/28/2017   IR CM INJ ANY COLONIC TUBE W/FLUORO  07/07/2019   IR CM INJ ANY COLONIC TUBE W/FLUORO  09/18/2019   IR GASTR TUBE CONVERT GASTR-JEJ PER W/FL MOD SED  03/14/2019   IR GENERIC HISTORICAL  07/01/2016   IR  GASTR TUBE CONVERT GASTR-JEJ PER W/FL MOD SED 07/01/2016 [name], [name] [facility]   IR GENERIC HISTORICAL  07/08/2016   IR PATIENT EVAL TECH 0-60 MINS 07/08/2016 [name], [name] [facility]   IR GENERIC HISTORICAL  07/14/2016   IR GJ TUBE CHANGE 07/14/2016 [name] [facility]   IR GENERIC HISTORICAL  07/21/2016   IR PATIENT EVAL TECH 0-60 MINS [facility]   IR GENERIC HISTORICAL  08/31/2016   IR REPLC DUODEN/JEJUNO TUBE PERCUT W/FLUORO 08/31/2016 [name] [facility]   IR GENERIC HISTORICAL  09/03/2016   IR GJ TUBE CHANGE 09/03/2016 [name] [facility]   IR GENERIC HISTORICAL  09/10/2016   IR GASTR TUBE CONVERT GASTR-JEJ  PER W/FL MOD SED 09/10/2016 [facility]   IR GENERIC HISTORICAL  09/16/2016   IR PATIENT EVAL TECH 0-60 MINS [facility]   IR GENERIC HISTORICAL  09/29/2016   IR GJ TUBE CHANGE 09/29/2016 [name] [facility]   IR GJ TUBE CHANGE  02/05/2017   IR GJ TUBE CHANGE  05/21/2017   IR GJ TUBE CHANGE  08/25/2017   IR GJ TUBE CHANGE  01/18/2018   IR GJ TUBE CHANGE  02/04/2018   IR GJ TUBE CHANGE  04/21/2018   IR GJ TUBE CHANGE  08/18/2018   IR GJ TUBE CHANGE  09/12/2019   IR GJ TUBE CHANGE  01/03/2020   IR GJ TUBE CHANGE  05/13/2020   IR GJ TUBE CHANGE  09/11/2020   IR GJ TUBE CHANGE  02/27/2021   IR MECH REMOV OBSTRUC MAT ANY COLON TUBE W/FLUORO  09/02/2018   IR REPLC GASTRO/COLONIC TUBE PERCUT W/FLUORO  10/17/2018   PEG PLACEMENT  10/21/2011   Procedure: PERCUTANEOUS ENDOSCOPIC GASTROSTOMY (PEG) REPLACEMENT;  Surgeon: [name];  Location: [facility];  Service: Endoscopy;  Laterality: N/A;   PEG PLACEMENT N/A 06/13/2013   Procedure: PERCUTANEOUS ENDOSCOPIC GASTROSTOMY (PEG) REPLACEMENT;  Surgeon: [name];  Location: [facility];  Service: Endoscopy;  Laterality: N/A;   SPINAL FUSION     spinal fusion to correct 70 degree kyphosis  11-2010   spinal fusioncorrect 106 degree kyphosis     SPINE SURGERY  ,11/20/2010, 2011   for correction of severe contracturing spinal kyphosis.    TONSILLECTOMY      Family History  Problem Relation Age of Onset   Asthma Mother    Hyperlipidemia Mother    COPD Mother    Other Mother        bronchial stasis/ABPA   Cancer Maternal Grandmother 67       breast   Hyperlipidemia Maternal Grandmother    Hypertension Maternal Grandmother    Cancer Maternal Grandfather        prostate   Heart disease Paternal Grandfather        CHF   Osteoporosis Paternal Grandmother    Arthritis Paternal Grandmother        rheumatoid    Social History   Socioeconomic History   Marital status: Single    Spouse name: Not on file   Number of  children: 0   Years of education: Not on file   Highest education level: Not on file  Occupational History   Occupation: disbaled  Tobacco Use   Smoking status: Never   Smokeless tobacco: Never  Vaping Use   Vaping Use: Never used  Substance and Sexual Activity   Alcohol use: No   Drug use: No   Sexual activity: Never  Other Topics Concern   Not on file  Social History Narrative   Not on file   Social Determinants of Health   Financial Resource Strain: Not on file  Food Insecurity: Not on file  Transportation Needs: Not on file  Physical Activity: Not on file  Stress: Not on file  Social Connections: Not on file  Intimate Partner Violence: Not on file    Outpatient Medications Prior to Visit  Medication Sig Dispense Refill   AMBULATORY NON [place] Medication Name: MIC gastrostomy/bolus feeding tube 24 French Part number 0110-24. #2 and 10 cc lurer lock syringe #2 Dx: 4 Device 2   baclofen (LIORESAL) 10 MG tablet Take 1 tablet (10 mg total) by mouth 2 (two) times daily. 1/2 tab twice daily for one week then 1 tab twice daily thereafter 60 tablet 3   Control Gel Formula Dressing (DUODERM CGF DRESSING) MISC Apply to affected area every other day or as needed. 30 each 0   dantrolene (DANTRIUM) 50 MG capsule TAKE ONE CAPSULE BY MOUTH EVERY MORNING and TAKE ONE CAPSULE BY MOUTH EVERY EVENING and TAKE ONE CAPSULE BY MOUTH EVERYDAY AT BEDTIME 90 capsule 2   divalproex (DEPAKOTE SPRINKLE) 125 MG capsule Take 2 capsules in morning, 5 capsules at bedtime (Patient taking differently: 250-625 mg. Take 2 capsules in morning, 5 capsules at bedtime) 210 capsule 4   Incontinence Supply Disposable (PREVAIL BREEZERS MEDIUM) MISC pkg of 16- size medium 32" to 44"  Breathable cloth-like outer fabric (can't use the plastic outer surgace  Item # [id number] 16 each 6   LORazepam (ATIVAN) 1 MG tablet Take one tablet in evenings as needed for insomnia. (Patient taking differently: 1.5  mg. Take and one half tablet in evenings for insomnia.) 30 tablet 1   Misc. Devices (ALL-BODY MASSAGE) MISC 1 Units/hr by Does not apply route as needed. Full body massage for Muscle spasticity due to Cerebral palsy 99 each 99   mupirocin ointment (BACTROBAN) 2 % Apply to area thin film twice daily if needed (Patient taking differently: as needed. Apply to area thin film twice daily if needed) 22 g 0   NON FORMULARY Bard Leg Bag Extension tubing w/Connector 18", Sterile, latex-free  Item# [id number]     NON FORMULARY Colorplast Freedom Cath Latex Self-Adhering Male External Catheter 53m Diameter Intermediate  Item# [id number]    NONFORMULARY OR COMPOUNDED ISuzy BouchardREF [id number]- Kangaroo Joey Pump Set with Flush Bags - 1000 mL 1 each 0   Nutritional Supplements (FEEDING SUPPLEMENT, KATE FARMS STANDARD 1.4,) LIQD liquid Take 325 mLs by mouth as directed. 3 carton over 16 hrs     OLANZapine (ZYPREXA) 5 MG tablet Take by mouth.     Omeprazole-Sodium Bicarbonate (ZEGERID) 20-1100 MG CAPS capsule Take 1 capsule by mouth daily before breakfast.     Ostomy Supplies (PROTECTIVE BARRIER WIPES) MISC 1-1/4" X 3"  Item #ID:[id number] each 6   oxybutynin (DITROPAN) 5 MG/5ML syrup Take 5 mg by mouth 2 (two) times daily.     PARoxetine (PAXIL) 10 MG tablet Take 15 mg by mouth daily.     sucralfate (CARAFATE) 1 g tablet TAKE 1 TABLET BY MOUTH 4 TIMES DAILY - WITH MEALS AND AT BEDTIME. (Patient taking differently: as needed.) 120 tablet 1   0.9 %  sodium chloride infusion      No facility-administered medications prior to visit.    Allergies  Allergen Reactions   Ambien [Zolpidem Tartrate] Nausea Only   Antihistamines, Chlorpheniramine-Type  Other reaction(s): Other (See Comments) Other Reaction: agitation   Augmentin [Amoxicillin-Pot Clavulanate] Diarrhea   Codeine Other (See Comments)    Makes patient too active after a few days.   Metoclopramide Other (See Comments)    Delusion, emotionality     Pheniramine Rash    Other reaction(s): Other (See Comments) Other Reaction: agitation   Sulfa Antibiotics Rash   Sulfonamide Derivatives Rash    Review of Systems  Constitutional:  Negative for fever and malaise/fatigue.  HENT:  Negative for congestion.   Eyes:  Negative for blurred vision.  Respiratory:  Negative for shortness of breath.   Cardiovascular:  Negative for chest pain, palpitations and leg swelling.  Gastrointestinal:  Negative for abdominal pain, blood in stool and nausea.  Genitourinary:  Negative for dysuria and frequency.  Musculoskeletal:  Negative for falls.  Skin:  Negative for rash.  Neurological:  Positive for focal weakness. Negative for dizziness, loss of consciousness and headaches.  Endo/Heme/Allergies:  Negative for environmental allergies.  Psychiatric/Behavioral:  Negative for depression. The patient is not nervous/anxious.       Objective:    Physical Exam Constitutional:      General: He is not in acute distress.    Appearance: Normal appearance. He is not ill-appearing or toxic-appearing.  HENT:     Head: Normocephalic and atraumatic.     Right Ear: External ear normal.     Left Ear: External ear normal.     Nose: Nose normal.  Eyes:     General:        Right eye: No discharge.        Left eye: No discharge.  Pulmonary:     Effort: Pulmonary effort is normal.  Skin:    Findings: No rash.  Neurological:     Mental Status: He is alert and oriented to person, place, and time.     Coordination: Coordination abnormal.     Gait: Gait abnormal.  Psychiatric:        Behavior: Behavior normal.    Pulse 91   Temp 99.3 F (37.4 C)   SpO2 98%  Wt Readings from Last 3 Encounters:  03/26/21 92 lb (41.7 kg)  05/28/20 (!) 92 lb 3.2 oz (41.8 kg)  03/28/20 92 lb (41.7 kg)    Diabetic Foot Exam - Simple   No data filed    Lab Results  Component Value Date   WBC 6.7 09/09/2020   HGB 15.9 09/09/2020   HCT 47.6 09/09/2020   PLT 185.0  09/09/2020   GLUCOSE 80 09/09/2020   CHOL 131 09/09/2020   TRIG 84.0 09/09/2020   HDL 35.80 (L) 09/09/2020   LDLCALC 78 09/09/2020   ALT 20 09/09/2020   AST 18 09/09/2020   NA 141 09/09/2020   K 3.9 09/09/2020   CL 102 09/09/2020   CREATININE 0.29 (L) 09/09/2020   BUN 8 09/09/2020   CO2 29 09/09/2020   TSH 2.14 09/09/2020   HGBA1C 5.0 04/29/2007    Lab Results  Component Value Date   TSH 2.14 09/09/2020   Lab Results  Component Value Date   WBC 6.7 09/09/2020   HGB 15.9 09/09/2020   HCT 47.6 09/09/2020   MCV 94.5 09/09/2020   PLT 185.0 09/09/2020   Lab Results  Component Value Date   NA 141 09/09/2020   K 3.9 09/09/2020   CO2 29 09/09/2020   GLUCOSE 80 09/09/2020   BUN 8 09/09/2020   CREATININE 0.29 (L) 09/09/2020   BILITOT 0.4  09/09/2020   ALKPHOS 79 09/09/2020   AST 18 09/09/2020   ALT 20 09/09/2020   PROT 7.2 09/09/2020   ALBUMIN 4.6 09/09/2020   CALCIUM 9.3 09/09/2020   ANIONGAP 11 09/18/2014   GFR 154.71 09/09/2020   Lab Results  Component Value Date   CHOL 131 09/09/2020   Lab Results  Component Value Date   HDL 35.80 (L) 09/09/2020   Lab Results  Component Value Date   LDLCALC 78 09/09/2020   Lab Results  Component Value Date   TRIG 84.0 09/09/2020   Lab Results  Component Value Date   CHOLHDL 4 09/09/2020   Lab Results  Component Value Date   HGBA1C 5.0 04/29/2007       Assessment & Plan:   Problem List Items Addressed This Visit     PALSY, INFANTILE CEREBRAL, QUADRIPLEGIC    He struggles with communication but uses a communication board effectively but he needs a rote column scanning set up due to his quadriplegia. He needs his orders rewritten to get his board in process. This visit confirms he is still in need of the board and a good candidate. He has worked with [employer] for years.  He is also waiting on his new power wheelchair due to supply chain issues but that process is till moving forward.        COVID-19    He has  fully recovered from [place] and they do not feel he has any residual concerns.         I am having Lajuana Matte "[name]" maintain his divalproex, Omeprazole-Sodium Bicarbonate, NON FORMULARY, NON FORMULARY, Protective Barrier Wipes, Prevail Breezers Medium, NONFORMULARY OR COMPOUNDED ITEM, LORazepam, PARoxetine, AMBULATORY NON FORMULARY MEDICATION, mupirocin ointment, All-Body Massage, sucralfate, feeding supplement (KATE FARMS STANDARD 1.4), DuoDERM CGF Dressing, OLANZapine, oxybutynin, baclofen, and dantrolene. We will stop administering sodium chloride.  No orders of the defined types were placed in this encounter.   I discussed the assessment and treatment plan with the patient. The patient was provided an opportunity to ask questions and all were answered. The patient agreed with the plan and demonstrated an understanding of the instructions.   The patient was advised to call back or seek an in-person evaluation if the symptoms worsen or if the condition fails to improve as anticipated.  I provided 15 minutes of face-to-face time during this encounter.   [name], [name] [facility] [phone] (phone) [phone] (fax)  [place]

## 2021-06-11 ENCOUNTER — Ambulatory Visit: Payer: Medicare Other | Admitting: Physical Medicine & Rehabilitation

## 2021-07-03 ENCOUNTER — Other Ambulatory Visit: Payer: Self-pay | Admitting: Physical Medicine & Rehabilitation

## 2021-07-03 DIAGNOSIS — G809 Cerebral palsy, unspecified: Secondary | ICD-10-CM

## 2021-07-03 DIAGNOSIS — G8250 Quadriplegia, unspecified: Secondary | ICD-10-CM

## 2021-07-14 ENCOUNTER — Other Ambulatory Visit (HOSPITAL_COMMUNITY): Payer: Self-pay | Admitting: Diagnostic Radiology

## 2021-07-14 DIAGNOSIS — R633 Feeding difficulties, unspecified: Secondary | ICD-10-CM

## 2021-07-23 ENCOUNTER — Encounter: Payer: Medicare Other | Attending: Physical Medicine & Rehabilitation | Admitting: Physical Medicine & Rehabilitation

## 2021-07-23 ENCOUNTER — Encounter: Payer: Self-pay | Admitting: Physical Medicine & Rehabilitation

## 2021-07-23 ENCOUNTER — Other Ambulatory Visit: Payer: Self-pay

## 2021-07-23 VITALS — BP 129/92 | HR 110 | Temp 99.4°F | Ht 60.0 in

## 2021-07-23 DIAGNOSIS — G8250 Quadriplegia, unspecified: Secondary | ICD-10-CM

## 2021-07-23 NOTE — Progress Notes (Signed)
.  Botox Injection for spasticity of lower extremity using needle EMG guidance Indication: Spastic quadriplegia (HCC)   Dilution: 100 Units/ml        Total Units Injected: 400 Indication: Severe spasticity which interferes with ADL,mobility and/or  hygiene and is unresponsive to medication management and other conservative care Informed consent was obtained after describing risks and benefits of the procedure with the patient. This includes bleeding, bruising, infection, excessive weakness, or medication side effects. A REMS form is on file and signed.  Needle: 44mm injectable monopolar needle electrode   bilateral Number of units per muscle  Quadriceps 200 units right 4 access pts, 200 units left 4 access points Gastroc/soleus   units Hamstrings  0 units Tibialis Posterior 0 units Tibialis Anterior 0 units EHL 0 units All injections were done after obtaining appropriate EMG activity and after negative drawback for blood. The patient tolerated the procedure well. Post procedure instructions were given. Return in about 3 months (around 10/22/2021) for botox 400u bilateral quads .

## 2021-07-23 NOTE — Patient Instructions (Signed)
PLEASE FEEL FREE TO CALL OUR OFFICE WITH ANY PROBLEMS OR QUESTIONS ([phone])      

## 2021-07-29 ENCOUNTER — Other Ambulatory Visit (HOSPITAL_COMMUNITY): Payer: Medicare Other

## 2021-07-31 ENCOUNTER — Telehealth: Payer: Self-pay | Admitting: Family Medicine

## 2021-07-31 NOTE — Telephone Encounter (Signed)
Last note was faxed to [phone]

## 2021-07-31 NOTE — Telephone Encounter (Signed)
Sharyn Lull from [facility] called to have patient chart office notes updated from 2021-2022 for enteral supplies. Sharyn Lull can be reached at [phone] ext [phone]. Please advise.

## 2021-07-31 NOTE — Telephone Encounter (Addendum)
LVM to get fax number

## 2021-08-01 ENCOUNTER — Other Ambulatory Visit: Payer: Self-pay

## 2021-08-01 ENCOUNTER — Ambulatory Visit (HOSPITAL_COMMUNITY)
Admission: RE | Admit: 2021-08-01 | Discharge: 2021-08-01 | Disposition: A | Discharge status: Home / Self Care | Payer: Medicare Other | Source: Ambulatory Visit | Attending: Diagnostic Radiology | Admitting: Diagnostic Radiology

## 2021-08-01 DIAGNOSIS — Z434 Encounter for attention to other artificial openings of digestive tract: Secondary | ICD-10-CM | POA: Insufficient documentation

## 2021-08-01 DIAGNOSIS — R633 Feeding difficulties, unspecified: Secondary | ICD-10-CM | POA: Insufficient documentation

## 2021-08-01 HISTORY — PX: IR GJ TUBE CHANGE: IMG1440

## 2021-08-01 MED ORDER — IOHEXOL 300 MG/ML  SOLN
25.0000 mL | Freq: Once | INTRAMUSCULAR | Status: AC | PRN
  Administered 2021-08-01: 25 mL

## 2021-08-01 MED ORDER — LIDOCAINE VISCOUS HCL 2 % MT SOLN
OROMUCOSAL | Status: AC
  Administered 2021-08-01: 4 mL via GASTROSTOMY
  Filled 2021-08-01: qty 15

## 2021-08-07 ENCOUNTER — Encounter: Payer: Self-pay | Admitting: Family Medicine

## 2021-08-07 ENCOUNTER — Ambulatory Visit (INDEPENDENT_AMBULATORY_CARE_PROVIDER_SITE_OTHER): Payer: Medicare Other | Admitting: Family Medicine

## 2021-08-07 ENCOUNTER — Other Ambulatory Visit: Payer: Self-pay

## 2021-08-07 VITALS — BP 121/86 | HR 65 | Temp 97.8°F

## 2021-08-07 DIAGNOSIS — D509 Iron deficiency anemia, unspecified: Secondary | ICD-10-CM | POA: Diagnosis not present

## 2021-08-07 DIAGNOSIS — E079 Disorder of thyroid, unspecified: Secondary | ICD-10-CM

## 2021-08-07 DIAGNOSIS — I739 Peripheral vascular disease, unspecified: Secondary | ICD-10-CM

## 2021-08-07 DIAGNOSIS — Z931 Gastrostomy status: Secondary | ICD-10-CM | POA: Diagnosis not present

## 2021-08-07 DIAGNOSIS — Z23 Encounter for immunization: Secondary | ICD-10-CM

## 2021-08-07 DIAGNOSIS — R32 Unspecified urinary incontinence: Secondary | ICD-10-CM

## 2021-08-07 DIAGNOSIS — K219 Gastro-esophageal reflux disease without esophagitis: Secondary | ICD-10-CM

## 2021-08-07 DIAGNOSIS — R002 Palpitations: Secondary | ICD-10-CM | POA: Diagnosis not present

## 2021-08-07 DIAGNOSIS — R159 Full incontinence of feces: Secondary | ICD-10-CM | POA: Diagnosis not present

## 2021-08-07 DIAGNOSIS — E785 Hyperlipidemia, unspecified: Secondary | ICD-10-CM | POA: Diagnosis not present

## 2021-08-07 NOTE — Progress Notes (Signed)
Patient ID: [name], male    DOB: [birth year]  Age: 35 y.o. MRN: [id number]    Subjective:   Chief Complaint  Patient presents with   Follow-up    Circulation    Subjective   HPI [name] is a nonverbal/nonambulatory patient who presents for office visit today for follow up on GERD and esophageal dysphagia. His mom and a nurse staff are present today speaking on his behalf. He started developing circulation issues with his bilateral UE's and LE's. [name] states that his current poor circulation does not cause him pain or discomfort. He is usually warm bodied and often sweats in bed easily, but since his changes in circulation he has been feeling colder. To help with circulation: He has an electric blanket and gets frequent massages on LE and UE bilaterally. He experiences poor circulation more than once daily. Denies CP/palp/SOB/HA/congestion/fevers or GU c/o. Taking meds as prescribed.  They were successful in getting his old wheelchair fixed and it is working out for him more than his new wheelchair. His GJ feeding tube was replaced recently. He still gets his magnesium supplements through applying CBD balm. He still has incontinence and switching from midline to barred catheters due to midline being out of stock caused a lot of spoiling accidents. His mother is hoping to switch to barred catheters once they become in stock again.   Review of Systems  Constitutional:  Negative for chills, fatigue and fever.  HENT:  Negative for congestion, rhinorrhea, sinus pressure, sinus pain and sore throat.   Eyes:  Negative for pain.  Respiratory:  Negative for cough and shortness of breath.   Cardiovascular:  Negative for chest pain, palpitations and leg swelling.  Gastrointestinal:  Negative for abdominal pain, blood in stool, diarrhea, nausea and vomiting.  Genitourinary:  Negative for flank pain, frequency and penile pain.  Musculoskeletal:  Negative for back pain.  Neurological:   Negative for headaches.   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   GERD (gastroesophageal reflux disease)    Hyperlipidemia, mild 08/25/2015   Hyperthyroidism    Incontinence of feces    Loss of weight 08/28/2014   Medicare annual wellness visit, subsequent 08/25/2015   Mildly underweight adult 03/16/2017   Palpitations    Skin lesion of right ear 03/16/2017   Thyroid disease 08/01/2010   Qualifier: Diagnosis of  By: Harlon Ditty CMA (AAMA), Dottie      He has a past surgical history that includes Spine surgery (,11/20/2010, 2011); Eye surgery; ears tubes; hamstring released; baclofen trial; baslofen pump implant; Spinal fusion; g-tube insert (August 2006); spinal fusioncorrect 106 degree kyphosis; spinal fusion to correct 70 degree kyphosis (11-2010); Tonsillectomy; PEG placement (10/21/2011); PEG placement (N/A, 06/13/2013); Hip surgery; Flexible sigmoidoscopy (N/A, 09/07/2014); ir generic historical (07/01/2016); ir generic historical (07/08/2016); ir generic historical (07/14/2016); ir generic historical (07/21/2016); ir generic historical (08/31/2016); ir generic historical (09/03/2016); ir generic historical (09/10/2016); ir generic historical (09/16/2016); ir generic historical (09/29/2016); IR GJ Tube Change (02/05/2017); IR GJ Tube Change (05/21/2017); IR Cm Inj Any Colonic Tube W/Fluoro (05/28/2017); IR GJ Tube Change (08/25/2017); IR GJ Tube Change (01/18/2018); IR GJ Tube Change (02/04/2018); IR GJ Tube Change (04/21/2018); IR GJ Tube Change (08/18/2018); IR Mech Remov Obstruc  Mat Any Colon Tube W/Fluoro (09/02/2018); IR Replc Gastro/Colonic Tube Percut W/Fluoro (10/17/2018); IR GASTR TUBE CONVERT GASTR-JEJ PER W/FL MOD SED (03/14/2019); IR Cm Inj Any Colonic Tube  W/Fluoro (07/07/2019); IR GJ Tube Change (09/12/2019); IR Cm Inj Any Colonic Tube W/Fluoro (09/18/2019); IR GJ Tube Change (01/03/2020); IR GJ Tube Change (05/13/2020); IR GJ Tube Change (09/11/2020); IR GJ Tube Change (02/27/2021); and IR GJ Tube Change (08/01/2021).   His family history includes Arthritis in his paternal grandmother; Asthma in his mother; COPD in his mother; Cancer in his maternal grandfather; Cancer (age of onset: 14) in his maternal grandmother; Heart disease in his paternal grandfather; Hyperlipidemia in his maternal grandmother and mother; Hypertension in his maternal grandmother; Osteoporosis in his paternal grandmother; Other in his mother.He reports that he has never smoked. He has never used smokeless tobacco. He reports that he does not drink alcohol and does not use drugs.  Current Outpatient Medications on File Prior to Visit  Medication Sig Dispense Refill   AMBULATORY NON FORMULARY MEDICATION Medication Name: MIC gastrostomy/bolus feeding tube 24 French Part number 0110-24. #2 and 10 cc lurer lock syringe #2 Dx: 4 Device 2   baclofen (LIORESAL) 10 MG tablet Take 1 tablet (10 mg total) by mouth 2 (two) times daily. 1/2 tab twice daily for one week then 1 tab twice daily thereafter 60 tablet 3   Control Gel Formula Dressing (DUODERM CGF DRESSING) MISC Apply to affected area every other day or as needed. 30 each 0   dantrolene (DANTRIUM) 50 MG capsule TAKE ONE CAPSULE BY MOUTH EVERY MORNING and TAKE ONE CAPSULE BY MOUTH EVERY EVENING and TAKE ONE CAPSULE BY MOUTH EVERYDAY AT BEDTIME 90 capsule 2   DEPAKOTE SPRINKLES 125 MG capsule Take by mouth.     Incontinence Supply Disposable (PREVAIL BREEZERS MEDIUM) MISC pkg of 16- size medium 32" to 44"  Breathable cloth-like outer fabric (can't use the plastic outer surgace  Item # [id number]/[id number] 16 each 6   LORazepam (ATIVAN) 1 MG tablet Take one tablet in evenings as needed for insomnia. (Patient taking differently: 1.5 mg. Take and one  half tablet in evenings for insomnia.) 30 tablet 1   Misc. Devices (ALL-BODY MASSAGE) MISC 1 Units/hr by Does not apply route as needed. Full body massage for Muscle spasticity due to Cerebral palsy 99 each 99   mupirocin ointment (BACTROBAN) 2 % Apply to area thin film twice daily if needed (Patient taking differently: as needed. Apply to area thin film twice daily if needed) 22 g 0   NON FORMULARY Bard Leg Bag Extension tubing w/Connector 18", Sterile, latex-free  Item# [id number]     NON FORMULARY Colorplast Freedom Cath Latex Self-Adhering Male External Catheter 109mm Diameter Intermediate  Item# [id number]     NONFORMULARY OR COMPOUNDED Suzy Bouchard REF [id number] - [place] Joey Pump Set with Flush Bags - 1000 mL 1 each 0   Nutritional Supplements (FEEDING SUPPLEMENT, KATE FARMS STANDARD 1.4,) LIQD liquid Take 325 mLs by mouth as directed. 3 carton over 16 hrs     OLANZapine (ZYPREXA) 5 MG tablet Take by mouth.     Omeprazole-Sodium Bicarbonate (ZEGERID) 20-1100 MG CAPS capsule Take 1 capsule by mouth daily before breakfast.     Ostomy Supplies (PROTECTIVE BARRIER WIPES) MISC 1-1/4" X 3"  Item #[id number] 75 each 6   oxybutynin (DITROPAN) 5 MG/5ML syrup Take 5 mg by mouth 2 (two) times daily.     PARoxetine (PAXIL) 10 MG tablet Take 15 mg by mouth daily.  sucralfate (CARAFATE) 1 g tablet TAKE 1 TABLET BY MOUTH 4 TIMES DAILY - WITH MEALS AND AT BEDTIME. (Patient taking differently: as needed.) 120 tablet 1   No current facility-administered medications on file prior to visit.     Objective:  Objective  Physical Exam Constitutional:      General: He is not in acute distress.    Appearance: Normal appearance. He is not ill-appearing or toxic-appearing.  HENT:     Head: Normocephalic and atraumatic.     Right Ear: Tympanic membrane, ear canal and external ear normal.     Left Ear: Tympanic membrane, ear canal and external ear normal.     Nose: No congestion or rhinorrhea.  Eyes:      Extraocular Movements: Extraocular movements intact.     Pupils: Pupils are equal, round, and reactive to light.  Cardiovascular:     Rate and Rhythm: Normal rate and regular rhythm.     Pulses: Normal pulses.     Heart sounds: Normal heart sounds. No murmur heard. Pulmonary:     Effort: Pulmonary effort is normal. No respiratory distress.     Breath sounds: Normal breath sounds. No wheezing, rhonchi or rales.  Abdominal:     General: Bowel sounds are normal.     Palpations: Abdomen is soft. There is no mass.     Tenderness: There is no abdominal tenderness. There is no guarding.     Hernia: No hernia is present.  Musculoskeletal:        General: Normal range of motion.     Cervical back: Normal range of motion and neck supple.  Skin:    General: Skin is warm and dry.  Neurological:     Mental Status: He is alert and oriented to person, place, and time.  Psychiatric:        Behavior: Behavior normal.   BP 121/86   Pulse 65   Temp 97.8 F (36.6 C)   SpO2 93%  Wt Readings from Last 3 Encounters:  03/26/21 92 lb (41.7 kg)  05/28/20 (!) 92 lb 3.2 oz (41.8 kg)  03/28/20 92 lb (41.7 kg)     Lab Results  Component Value Date   WBC 7.3 08/07/2021   HGB 15.5 08/07/2021   HCT 45.7 08/07/2021   PLT 179.0 Repeated and verified X2. 08/07/2021   GLUCOSE 75 08/07/2021   CHOL 129 08/07/2021   TRIG 80.0 08/07/2021   HDL 37.40 (L) 08/07/2021   LDLCALC 76 08/07/2021   ALT 24 08/07/2021   AST 24 08/07/2021   NA 140 08/07/2021   K 4.1 08/07/2021   CL 101 08/07/2021   CREATININE 0.30 (L) 08/07/2021   BUN 8 08/07/2021   CO2 28 08/07/2021   TSH 1.81 08/07/2021   HGBA1C 5.0 04/29/2007    IR GJ Tube Change  Result Date: 08/01/2021 INDICATION: Chronic gastrojejunostomy tube, need for routine exchange EXAM: Exchange of gastrojejunostomy tube using fluoroscopic guidance MEDICATIONS: None ANESTHESIA/SEDATION: Local analgesia CONTRAST:  10 mL-administered into the gastric lumen.  FLUOROSCOPY TIME:  Fluoroscopy Time: 3 minutes 18 seconds (13 mGy). COMPLICATIONS: None immediate. PROCEDURE: Informed written consent was obtained from the patient after a thorough discussion of the procedural risks, benefits and alternatives. All questions were addressed. Maximal Sterile Barrier Technique was utilized including caps, mask, sterile gowns, sterile gloves, sterile drape, hand hygiene and skin antiseptic. A timeout was performed prior to the initiation of the procedure. The patient was placed supine on the exam table. The mid abdomen was  prepped and draped in a standard sterile fashion with inclusion of the existing gastrojejunostomy tube within the sterile field. Injection of the existing gastrojejunostomy tube demonstrated appropriate position of the tip catheter within proximal jejunum past the ligament of Treitz. Decision was made to proceed with catheter exchange. Using combination of a stiff Glidewire and 4 French glide catheter, the wire was advanced into the mid jejunum. The retention balloon was deflated, and the existing gastrojejunostomy tube was exchanged for a new 24 French gastrojejunostomy tube. The new tube was cut to a similar length of the previous tube. The tip of the catheter was advanced into the proximal jejunum past the ligament of Treitz. The balloon was inflated with approximately 9 mL of sterile water. He was brought back to the gastric wall. The external bumper was brought down to the skin. Contrast injection of the new tube demonstrated appropriate position within the proximal jejunum past the ligament of Treitz. The patient tolerated the procedure well without immediate complication. IMPRESSION: Successful exchange of the gastrojejunostomy tube for a new, similar 24 French gastrojejunostomy tube with the tip in the proximal jejunum past the ligament Treitz. The tube is ready for immediate use. Electronically Signed   By: Albin Felling M.D.   On: 08/01/2021 12:44      Assessment & Plan:  Plan    No orders of the defined types were placed in this encounter.   Problem List Items Addressed This Visit     Thyroid disease - Primary   GERD   PALPITATIONS   Relevant Orders   CBC (Completed)   Comprehensive metabolic panel (Completed)   TSH (Completed)   Incontinence of feces    Continues to struggle with incontinence due to CP      Urinary incontinence    Due to CP, continues to use routinely and goes through roughly 2 packs per month. They prefer the Medline caths but has been required to use Bard catheters.       Anemia   Relevant Orders   CBC (Completed)   TSH (Completed)   Gastrostomy in place [facility])    Recently had his JG tube replaced. Continues to be NPO. He is doing well with feeds      Hyperlipidemia, mild    Encourage heart healthy diet such as MIND or DASH diet, increase exercise, avoid trans fats, simple carbohydrates and processed foods, consider a krill or fish or flaxseed oil cap daily.       Relevant Orders   Lipid panel (Completed)   TSH (Completed)   PVD (peripheral vascular disease) ([place])    He is having more trouble with circulation and being cold. He has trouble with his feet and legs turning more purple in the cold. It is bilateral and not present today. They will report if it worsens.       Other Visit Diagnoses     Need for influenza vaccination       Relevant Orders   Flu Vaccine QUAD 43mo+IM (Fluarix, Fluzone & Alfiuria Quad PF) (Completed)       Follow-up: Return in about 3 months (around 11/07/2021), or VV, for vv f/u.  I, Suezanne Jacquet, acting as a scribe for [name], MD, have documented all relevent documentation on behalf of [name], MD, as directed by [name], MD while in the presence of [name], MD. DO:08/10/21.  I, [name], MD personally performed the services described in this documentation. All medical record entries made by the scribe  were at my direction and in my  presence. I have reviewed the chart and agree that the record reflects my personal performance and is accurate and complete

## 2021-08-08 LAB — COMPREHENSIVE METABOLIC PANEL
ALT: 24 U/L (ref 0–53)
AST: 24 U/L (ref 0–37)
Albumin: 4.6 g/dL (ref 3.5–5.2)
Alkaline Phosphatase: 73 U/L (ref 39–117)
BUN: 8 mg/dL (ref 6–23)
CO2: 28 mEq/L (ref 19–32)
Calcium: 9.4 mg/dL (ref 8.4–10.5)
Chloride: 101 mEq/L (ref 96–112)
Creatinine, Ser: 0.3 mg/dL — ABNORMAL LOW (ref 0.40–1.50)
GFR: 152.15 mL/min (ref >60.00)
Glucose, Bld: 75 mg/dL (ref 70–99)
Potassium: 4.1 mEq/L (ref 3.5–5.1)
Sodium: 140 mEq/L (ref 135–145)
Total Bilirubin: 0.5 mg/dL (ref 0.2–1.2)
Total Protein: 7.4 g/dL (ref 6.0–8.3)

## 2021-08-08 LAB — CBC
HCT: 45.7 % (ref 39.0–52.0)
Hemoglobin: 15.5 g/dL (ref 13.0–17.0)
MCHC: 33.9 g/dL (ref 30.0–36.0)
MCV: 94.8 fl (ref 78.0–100.0)
Platelets: 179 10*3/uL (ref 150.0–400.0)
RBC: 4.82 Mil/uL (ref 4.22–5.81)
RDW: 12.7 % (ref 11.5–15.5)
WBC: 7.3 10*3/uL (ref 4.0–10.5)

## 2021-08-08 LAB — LIPID PANEL
Cholesterol: 129 mg/dL (ref 0–200)
HDL: 37.4 mg/dL — ABNORMAL LOW (ref >39.00)
LDL Cholesterol: 76 mg/dL (ref 0–99)
NonHDL: 91.58
Total CHOL/HDL Ratio: 3
Triglycerides: 80 mg/dL (ref 0.0–149.0)
VLDL: 16 mg/dL (ref 0.0–40.0)

## 2021-08-08 LAB — TSH: TSH: 1.81 u[IU]/mL (ref 0.35–5.50)

## 2021-08-10 DIAGNOSIS — I739 Peripheral vascular disease, unspecified: Secondary | ICD-10-CM | POA: Insufficient documentation

## 2021-08-10 NOTE — Assessment & Plan Note (Signed)
Due to CP, continues to use routinely and goes through roughly 2 packs per month. They prefer the Medline caths but has been required to use Bard catheters.

## 2021-08-10 NOTE — Assessment & Plan Note (Signed)
Encourage heart healthy diet such as MIND or DASH diet, increase exercise, avoid trans fats, simple carbohydrates and processed foods, consider a krill or fish or flaxseed oil cap daily.  °

## 2021-08-10 NOTE — Assessment & Plan Note (Signed)
He is having more trouble with circulation and being cold. He has trouble with his feet and legs turning more purple in the cold. It is bilateral and not present today. They will report if it worsens.

## 2021-08-10 NOTE — Assessment & Plan Note (Signed)
Recently had his JG tube replaced. Continues to be NPO. He is doing well with feeds

## 2021-08-10 NOTE — Assessment & Plan Note (Signed)
Continues to struggle with incontinence due to CP

## 2021-08-18 DIAGNOSIS — F339 Major depressive disorder, recurrent, unspecified: Secondary | ICD-10-CM | POA: Diagnosis not present

## 2021-08-29 DIAGNOSIS — Z23 Encounter for immunization: Secondary | ICD-10-CM | POA: Diagnosis not present

## 2021-09-17 DIAGNOSIS — R350 Frequency of micturition: Secondary | ICD-10-CM | POA: Diagnosis not present

## 2021-09-17 DIAGNOSIS — N1330 Unspecified hydronephrosis: Secondary | ICD-10-CM | POA: Diagnosis not present

## 2021-09-29 ENCOUNTER — Other Ambulatory Visit: Payer: Self-pay | Admitting: Internal Medicine

## 2021-10-03 IMAGING — XA IR REPLACE G/J TUBE W/ FLUORO
4 series · 12 of 15 positions shown · non-contrast
Comparison: none

INDICATION: 34-year-old with a chronic GJ feeding tube. Patient presents for
routine exchange.

[Series 2: fl - angio · 3 of 82 frames shown (1 of 3)]
[frame 1/82]
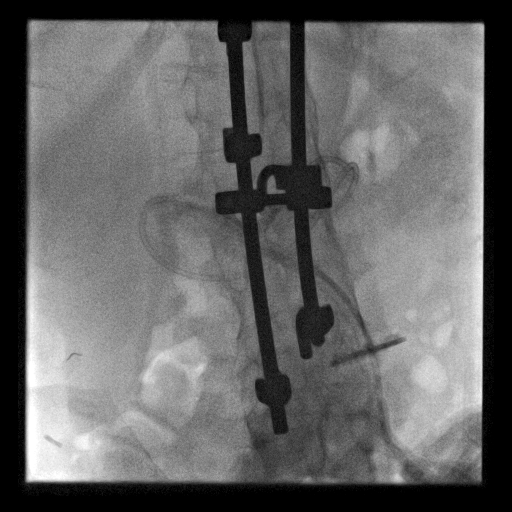
[frame 13/82]
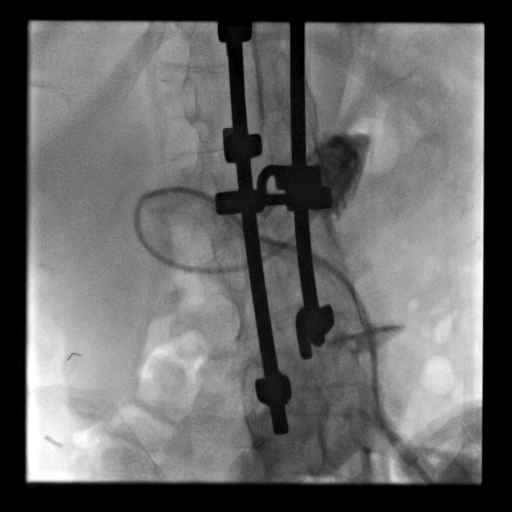
[frame 70/82]
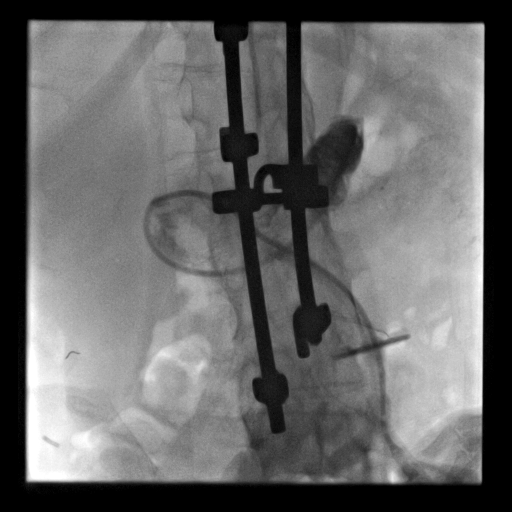

[Series 3: fl - angio · 3 of 59 frames shown (2 of 3)]
[frame 9/59]
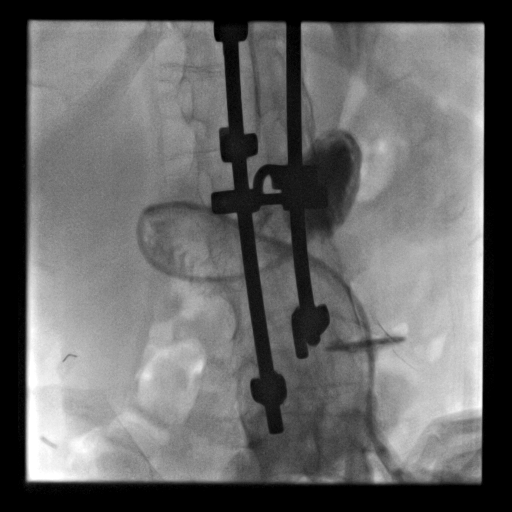
[frame 30/59]
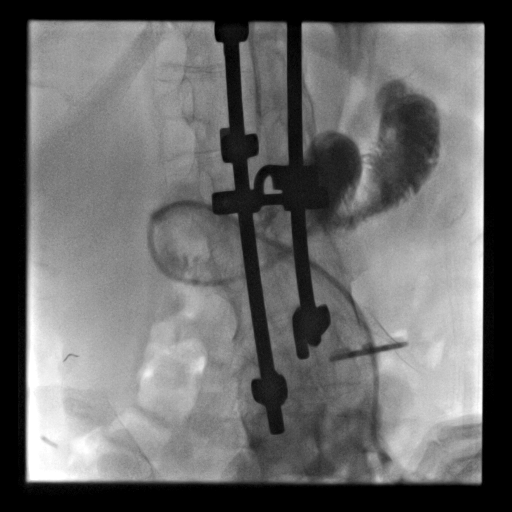
[frame 51/59]
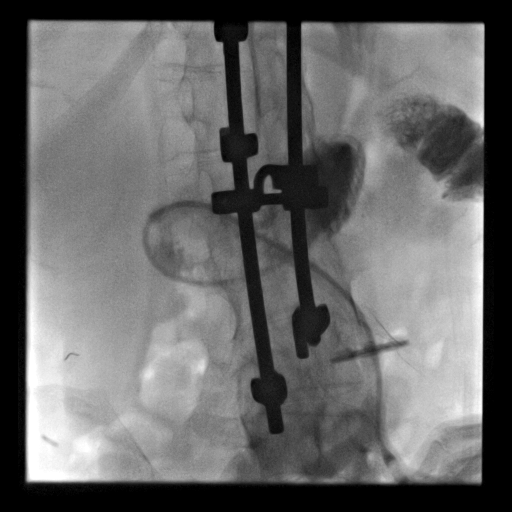

[Series 5: fl - angio · 4 of 58 frames shown (3 of 3)]
[frame 6/58]
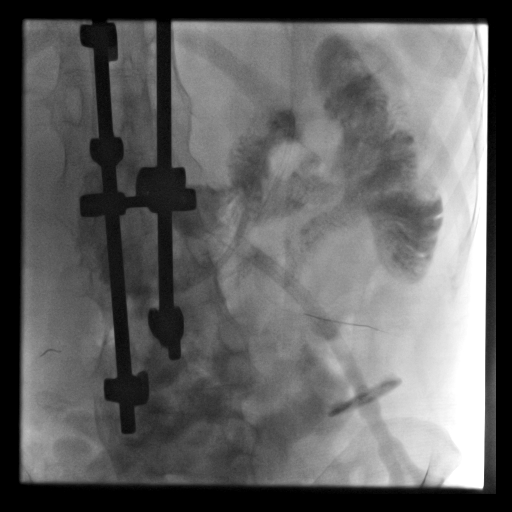
[frame 9/58]
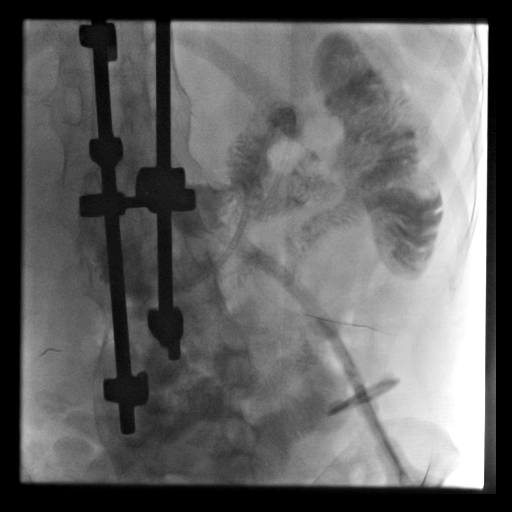
[frame 30/58]
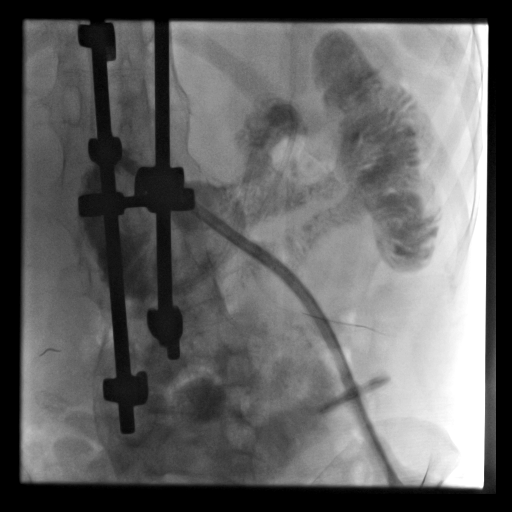
[frame 50/58]
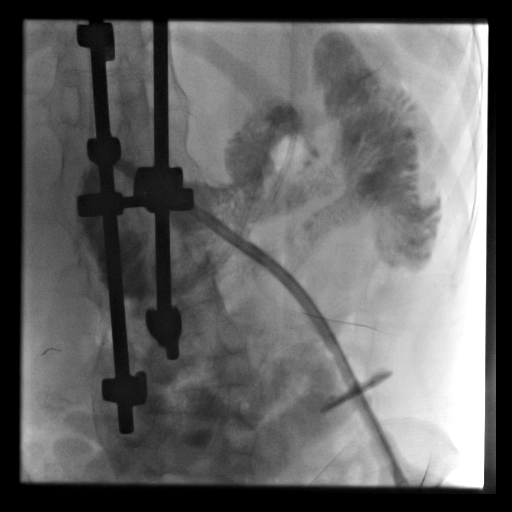

[Series 300: tube placements · 2 of 3 slices shown]
[im 2/3]
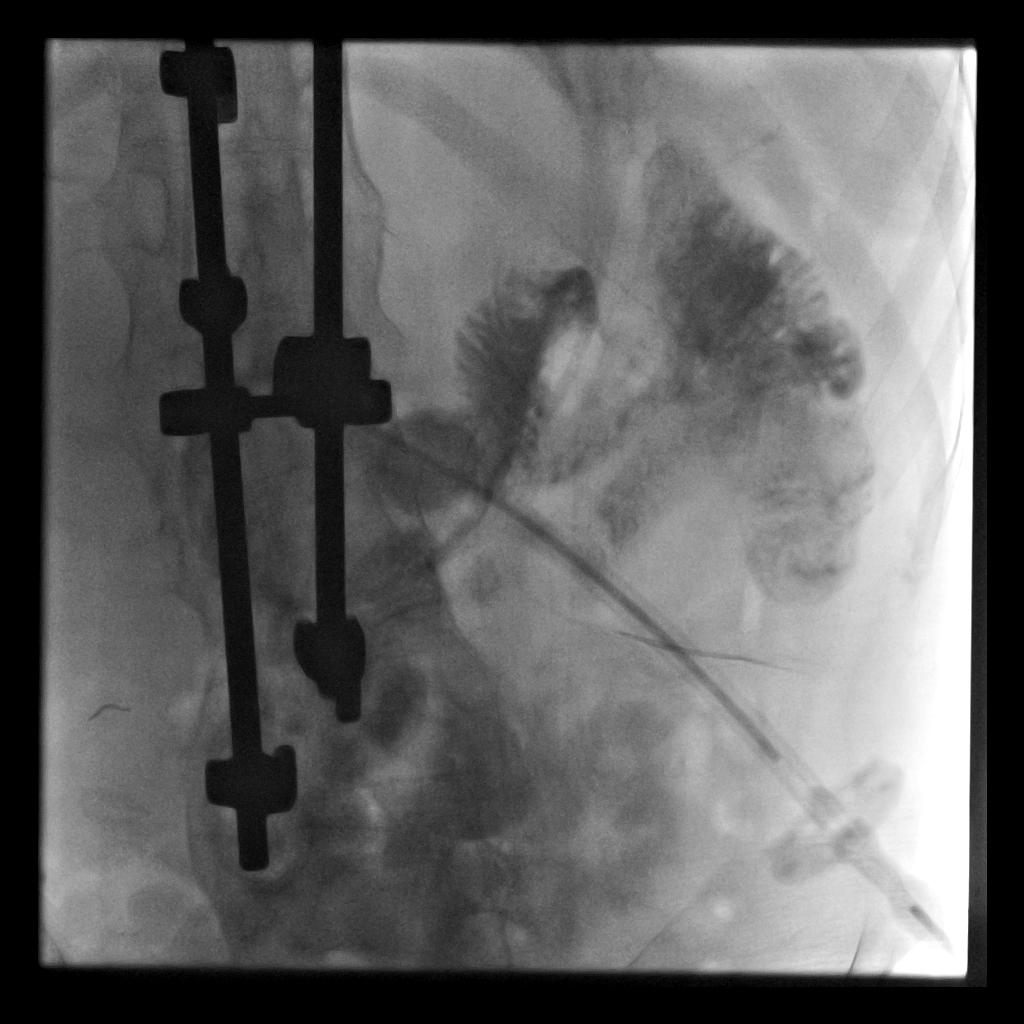
[im 3/3]
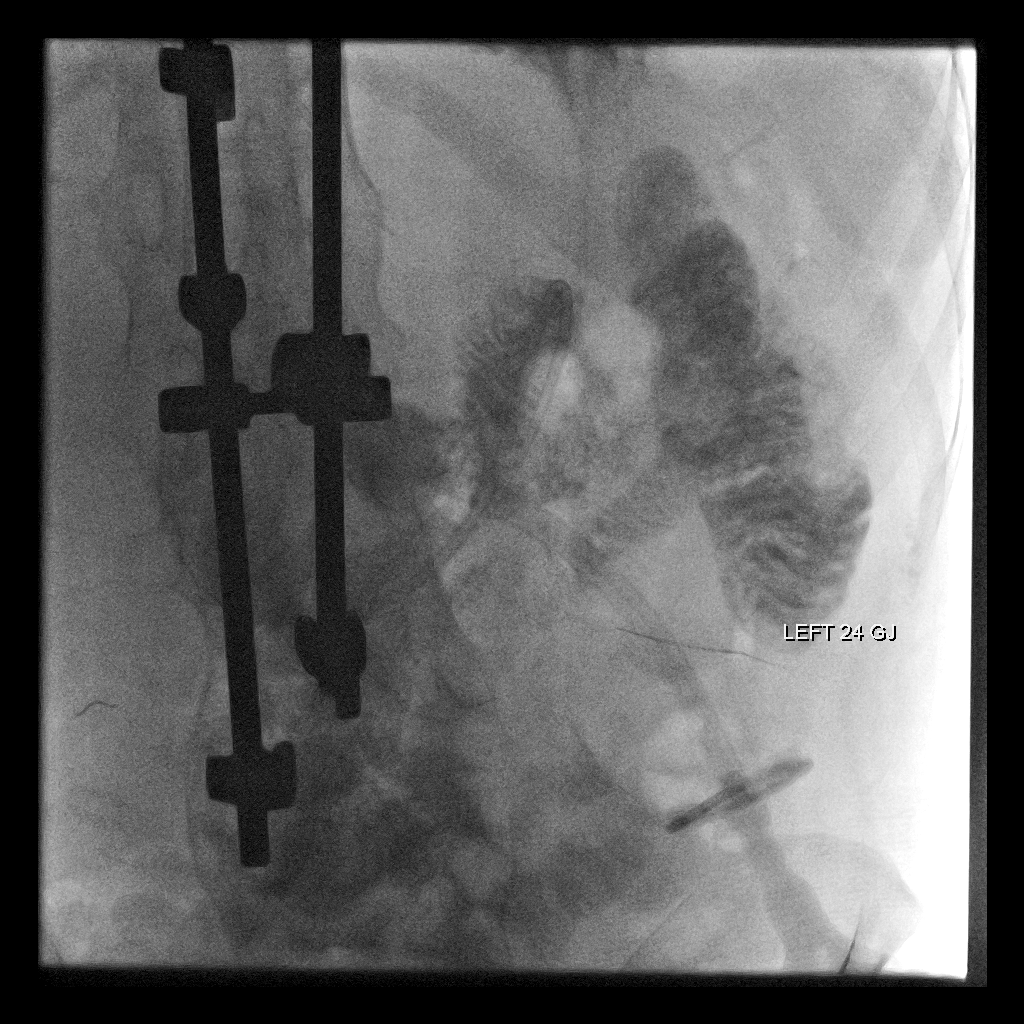

[12 of 15 positions shown; findings below may reference images not displayed]

EXAM:
GJ FEEDING TUBE EXCHANGE WITH FLUOROSCOPY

MEDICATIONS:
None

ANESTHESIA/SEDATION:
None

CONTRAST:  15 mL Omnipaque 300-administered into the gastric lumen.

FLUOROSCOPY TIME:  Fluoroscopy Time: 5 minutes, 48 seconds, 39 mGy

COMPLICATIONS:
None immediate.

PROCEDURE:
Patient was placed supine on the fluoroscopic table. The existing
tube and surrounding skin were prepped and draped in sterile
fashion. Maximal barrier sterile technique was utilized including
caps, mask, sterile gowns, sterile gloves, sterile drape, hand
hygiene and skin antiseptic.

Contrast injection confirmed that the feeding tube was in the
proximal jejunum. The balloon was deflated and the catheter was
partially pulled out. Catheter was cut and stiff Glidewire was
placed. Old catheter was removed over the wire. A new 24 French GJ
feeding tube was cut to a similar length as the Falkner tube. The tube
was advanced over the wire. Unfortunately, the tube coiled into the
stomach and eventually access was completely lost. 65 cm, 5 French
Kumpe catheter was used to advance a Roadrunner wire into the
stomach and duodenum. Wire was placed in the proximal jejunum. 5
French catheter was removed over the wire a nd the 24 French GJ
feeding tube was successfully advanced over the wire. Catheter tip
was coiled in the distal duodenum. Balloon was inflated with
approximately 6 mL of very dilute contrast. Contrast injection was
performed through the G and J lumens.
FINDINGS: Tip of the new tube is coiled in the distal duodenum.
IMPRESSION: Successful exchange of the GJ feeding tube. Catheter tip is in the
distal duodenum. Both lumens are ready for use.

## 2021-10-22 ENCOUNTER — Ambulatory Visit: Payer: Medicare Other | Admitting: Physical Medicine & Rehabilitation

## 2021-11-13 ENCOUNTER — Telehealth (INDEPENDENT_AMBULATORY_CARE_PROVIDER_SITE_OTHER): Payer: Medicare Other | Admitting: Family Medicine

## 2021-11-13 DIAGNOSIS — E43 Unspecified severe protein-calorie malnutrition: Secondary | ICD-10-CM | POA: Diagnosis not present

## 2021-11-13 DIAGNOSIS — F418 Other specified anxiety disorders: Secondary | ICD-10-CM | POA: Diagnosis not present

## 2021-11-14 ENCOUNTER — Telehealth: Payer: Self-pay

## 2021-11-14 NOTE — Assessment & Plan Note (Signed)
Is tolerating his feeds and doing well

## 2021-11-14 NOTE — Assessment & Plan Note (Signed)
His mother reports he is doing well on current meds

## 2021-11-14 NOTE — Progress Notes (Signed)
MyChart Video Visit    Virtual Visit via Video Note   This visit type was conducted due to national recommendations for restrictions regarding the COVID-19 Pandemic (e.g. social distancing) in an effort to limit this patient's exposure and mitigate transmission in our community. This patient is at least at moderate risk for complications without adequate follow up. This format is felt to be most appropriate for this patient at this time. Physical exam was limited by quality of the video and audio technology used for the visit. S Chism, CMA was able to get the patient set up on a video visit.  Patient location: home Patient, his mother and provider in visit Provider location: Office  I discussed the limitations of evaluation and management by telemedicine and the availability of in person appointments. The patient expressed understanding and agreed to proceed.  Visit Date: 11/13/2021  Today's healthcare provider: [name], MD     Subjective:    Patient ID: [name], male    DOB: [birth year], 36 y.o.   MRN: [id number]  Chief Complaint  Patient presents with   Follow-up    HPI Patient is in today for follow up on chronic medical concerns. His mother reports he is doing well. No recent febrile illness or concerns. His feeds are going well. Denies palp/SOB/HA/congestion/fevers/GI or GU c/o. Taking meds as prescribed   Past Medical History:  Diagnosis Date   Cerebral palsy ([place])    Dehydration 11/22/2013   Depression with anxiety 08/01/2010   Qualifier: Diagnosis of  By: Nelson-Smith CMA (AAMA), Dottie     Dyslipidemia 08/19/2017   Esophagitis 2011   Gastrostomy in place [facility]) 08/31/2013   GERD (gastroesophageal reflux disease)    Hyperlipidemia, mild 08/25/2015   Hyperthyroidism    Incontinence of feces    Loss of weight 08/28/2014   Medicare annual wellness visit, subsequent 08/25/2015   Mildly underweight adult 03/16/2017   Palpitations    Skin lesion of right  ear 03/16/2017   Thyroid disease 08/01/2010   Qualifier: Diagnosis of  By: Harlon Ditty CMA (AAMA), Dottie      Past Surgical History:  Procedure Laterality Date   baclofen trial     baslofen pump implant     ears tubes     EYE SURGERY     FLEXIBLE SIGMOIDOSCOPY N/A 09/07/2014   Procedure: FLEXIBLE SIGMOIDOSCOPY;  Surgeon: [name], MD;  Location: [facility];  Service: Endoscopy;  Laterality: N/A;   g-tube insert  August 2006   hamstring released     to treat contractures.    HIP SURGERY     x2 , side    IR CM INJ ANY COLONIC TUBE W/FLUORO  05/28/2017   IR CM INJ ANY COLONIC TUBE W/FLUORO  07/07/2019   IR CM INJ ANY COLONIC TUBE W/FLUORO  09/18/2019   IR GASTR TUBE CONVERT GASTR-JEJ PER W/FL MOD SED  03/14/2019   IR GENERIC HISTORICAL  07/01/2016   IR GASTR TUBE CONVERT GASTR-JEJ PER W/FL MOD SED 07/01/2016 [name], MD [facility]   IR GENERIC HISTORICAL  07/08/2016   IR PATIENT EVAL TECH 0-60 MINS 07/08/2016 [name], MD [facility]   IR GENERIC HISTORICAL  07/14/2016   IR GJ TUBE CHANGE 07/14/2016 [name], MD [facility]   IR GENERIC HISTORICAL  07/21/2016   IR PATIENT EVAL TECH 0-60 MINS [facility]   IR GENERIC HISTORICAL  08/31/2016   IR [place] DUODEN/JEJUNO TUBE PERCUT W/FLUORO 08/31/2016 [name], MD [facility]  IR GENERIC HISTORICAL  09/03/2016   IR GJ TUBE CHANGE 09/03/2016 [name], MD [facility]   IR GENERIC HISTORICAL  09/10/2016   IR GASTR TUBE CONVERT GASTR-JEJ PER W/FL MOD SED 09/10/2016 [facility]   IR GENERIC HISTORICAL  09/16/2016   IR PATIENT EVAL TECH 0-60 MINS [facility]   IR GENERIC HISTORICAL  09/29/2016   IR GJ TUBE CHANGE 09/29/2016 [name], MD [facility]   IR GJ TUBE CHANGE  02/05/2017   IR GJ TUBE CHANGE  05/21/2017   IR GJ TUBE CHANGE  08/25/2017   IR GJ TUBE CHANGE  01/18/2018   IR GJ TUBE CHANGE  02/04/2018   IR GJ TUBE CHANGE  04/21/2018   IR GJ TUBE CHANGE  08/18/2018   IR GJ TUBE CHANGE  09/12/2019   IR  GJ TUBE CHANGE  01/03/2020   IR GJ TUBE CHANGE  05/13/2020   IR GJ TUBE CHANGE  09/11/2020   IR GJ TUBE CHANGE  02/27/2021   IR GJ TUBE CHANGE  08/01/2021   IR MECH REMOV OBSTRUC MAT ANY COLON TUBE W/FLUORO  09/02/2018   IR REPLC GASTRO/COLONIC TUBE PERCUT W/FLUORO  10/17/2018   PEG PLACEMENT  10/21/2011   Procedure: PERCUTANEOUS ENDOSCOPIC GASTROSTOMY (PEG) REPLACEMENT;  Surgeon: [name], MD;  Location: [facility];  Service: Endoscopy;  Laterality: N/A;   PEG PLACEMENT N/A 06/13/2013   Procedure: PERCUTANEOUS ENDOSCOPIC GASTROSTOMY (PEG) REPLACEMENT;  Surgeon: [name], MD;  Location: [facility];  Service: Endoscopy;  Laterality: N/A;   SPINAL FUSION     spinal fusion to correct 70 degree kyphosis  11-2010   spinal fusioncorrect 106 degree kyphosis     SPINE SURGERY  ,11/20/2010, 2011   for correction of severe contracturing spinal kyphosis.    TONSILLECTOMY      Family History  Problem Relation Age of Onset   Asthma Mother    Hyperlipidemia Mother    COPD Mother    Other Mother        bronchial stasis/ABPA   Cancer Maternal Grandmother 14       breast   Hyperlipidemia Maternal Grandmother    Hypertension Maternal Grandmother    Cancer Maternal Grandfather        prostate   Heart disease Paternal Grandfather        CHF   Osteoporosis Paternal Grandmother    Arthritis Paternal Grandmother        rheumatoid    Social History   Socioeconomic History   Marital status: Single    Spouse name: Not on file   Number of children: 0   Years of education: Not on file   Highest education level: Not on file  Occupational History   Occupation: disbaled  Tobacco Use   Smoking status: Never   Smokeless tobacco: Never  Vaping Use   Vaping Use: Never used  Substance and Sexual Activity   Alcohol use: No   Drug use: No   Sexual activity: Never  Other Topics Concern   Not on file  Social History Narrative   Not on file   Social Determinants of Health   Financial  Resource Strain: Not on file  Food Insecurity: Not on file  Transportation Needs: Not on file  Physical Activity: Not on file  Stress: Not on file  Social Connections: Not on file  Intimate Partner Violence: Not on file    Outpatient Medications Prior to Visit  Medication Sig Dispense Refill   [place]  MEDICATION Medication Name: MIC gastrostomy/bolus feeding tube 24 French Part number 0110-24. #2 and 10 cc lurer lock syringe #2 Dx: 4 Device 2   baclofen (LIORESAL) 10 MG tablet Take 1 tablet (10 mg total) by mouth 2 (two) times daily. 1/2 tab twice daily for one week then 1 tab twice daily thereafter 60 tablet 3   Control Gel Formula Dressing (DUODERM CGF DRESSING) MISC Apply to affected area every other day or as needed. 30 each 0   dantrolene (DANTRIUM) 50 MG capsule TAKE ONE CAPSULE BY MOUTH EVERY MORNING and TAKE ONE CAPSULE BY MOUTH EVERY EVENING and TAKE ONE CAPSULE BY MOUTH EVERYDAY AT BEDTIME 90 capsule 2   DEPAKOTE SPRINKLES 125 MG capsule Take by mouth.     Incontinence Supply Disposable (PREVAIL BREEZERS MEDIUM) MISC pkg of 16- size medium 32" to 44"  Breathable cloth-like outer fabric (can't use the plastic outer surgace  Item # [id number]/[id number] 16 each 6   LORazepam (ATIVAN) 1 MG tablet Take one tablet in evenings as needed for insomnia. (Patient taking differently: 1.5 mg. Take and one half tablet in evenings for insomnia.) 30 tablet 1   Misc. Devices (ALL-BODY MASSAGE) MISC 1 Units/hr by Does not apply route as needed. Full body massage for Muscle spasticity due to Cerebral palsy 99 each 99   mupirocin ointment (BACTROBAN) 2 % Apply to area thin film twice daily if needed (Patient taking differently: as needed. Apply to area thin film twice daily if needed) 22 g 0   NON FORMULARY Bard Leg Bag Extension tubing w/Connector 18", Sterile, latex-free  Item# [id number]     NON FORMULARY Colorplast Freedom Cath Latex Self-Adhering Male External Catheter 48mm Diameter  Intermediate  Item# [id number]     NONFORMULARY OR COMPOUNDED Suzy Bouchard REF [id number] - [place] Pump Set with Flush Bags - 1000 mL 1 each 0   Nutritional Supplements (FEEDING SUPPLEMENT, KATE FARMS STANDARD 1.4,) LIQD liquid Take 325 mLs by mouth as directed. 3 carton over 16 hrs     OLANZapine (ZYPREXA) 5 MG tablet Take by mouth.     Omeprazole-Sodium Bicarbonate (ZEGERID) 20-1100 MG CAPS capsule Take 1 capsule by mouth daily before breakfast.     Ostomy Supplies (PROTECTIVE BARRIER WIPES) MISC 1-1/4" X 3"  Item #[id number] 75 each 6   oxybutynin (DITROPAN) 5 MG/5ML syrup Take 5 mg by mouth 2 (two) times daily.     PARoxetine (PAXIL) 10 MG tablet Take 15 mg by mouth daily.     sucralfate (CARAFATE) 1 g tablet TAKE 1 TABLET BY MOUTH 4 TIMES DAILY WITH MEALS AND AT BEDTIME 120 tablet 1   No facility-administered medications prior to visit.    Allergies  Allergen Reactions   Ambien [Zolpidem Tartrate] Nausea Only   Antihistamines, Chlorpheniramine-Type     Other reaction(s): Other (See Comments) Other Reaction: agitation   Augmentin [Amoxicillin-Pot Clavulanate] Diarrhea   Codeine Other (See Comments)    Makes patient too active after a few days.   Metoclopramide Other (See Comments)    Delusion, emotionality    Pheniramine Rash    Other reaction(s): Other (See Comments) Other Reaction: agitation   Sulfa Antibiotics Rash   Sulfonamide Derivatives Rash    Review of Systems  Constitutional:  Negative for fever and malaise/fatigue.  HENT:  Negative for congestion.   Respiratory:  Negative for shortness of breath.   Cardiovascular:  Negative for chest pain.  Gastrointestinal:  Negative for abdominal pain, blood in stool and nausea.  Skin:  Negative for rash.  Neurological:  Negative for loss of consciousness and headaches.  Endo/Heme/Allergies:  Negative for environmental allergies.  Psychiatric/Behavioral:  Negative for depression. The patient is not nervous/anxious.        Objective:    Physical Exam Musculoskeletal:     Right lower leg: No edema.     Left lower leg: No edema.  Neurological:     Mental Status: He is alert.     Coordination: Coordination abnormal.    There were no vitals taken for this visit. Wt Readings from Last 3 Encounters:  03/26/21 92 lb (41.7 kg)  05/28/20 (!) 92 lb 3.2 oz (41.8 kg)  03/28/20 92 lb (41.7 kg)    Diabetic Foot Exam - Simple   No data filed    Lab Results  Component Value Date   WBC 7.3 08/07/2021   HGB 15.5 08/07/2021   HCT 45.7 08/07/2021   PLT 179.0 Repeated and verified X2. 08/07/2021   GLUCOSE 75 08/07/2021   CHOL 129 08/07/2021   TRIG 80.0 08/07/2021   HDL 37.40 (L) 08/07/2021   LDLCALC 76 08/07/2021   ALT 24 08/07/2021   AST 24 08/07/2021   NA 140 08/07/2021   K 4.1 08/07/2021   CL 101 08/07/2021   CREATININE 0.30 (L) 08/07/2021   BUN 8 08/07/2021   CO2 28 08/07/2021   TSH 1.81 08/07/2021   HGBA1C 5.0 04/29/2007    Lab Results  Component Value Date   TSH 1.81 08/07/2021   Lab Results  Component Value Date   WBC 7.3 08/07/2021   HGB 15.5 08/07/2021   HCT 45.7 08/07/2021   MCV 94.8 08/07/2021   PLT 179.0 Repeated and verified X2. 08/07/2021   Lab Results  Component Value Date   NA 140 08/07/2021   K 4.1 08/07/2021   CO2 28 08/07/2021   GLUCOSE 75 08/07/2021   BUN 8 08/07/2021   CREATININE 0.30 (L) 08/07/2021   BILITOT 0.5 08/07/2021   ALKPHOS 73 08/07/2021   AST 24 08/07/2021   ALT 24 08/07/2021   PROT 7.4 08/07/2021   ALBUMIN 4.6 08/07/2021   CALCIUM 9.4 08/07/2021   ANIONGAP 11 09/18/2014   GFR 152.15 08/07/2021   Lab Results  Component Value Date   CHOL 129 08/07/2021   Lab Results  Component Value Date   HDL 37.40 (L) 08/07/2021   Lab Results  Component Value Date   LDLCALC 76 08/07/2021   Lab Results  Component Value Date   TRIG 80.0 08/07/2021   Lab Results  Component Value Date   CHOLHDL 3 08/07/2021   Lab Results  Component Value Date    HGBA1C 5.0 04/29/2007       Assessment & Plan:   Problem List Items Addressed This Visit     Depression with anxiety    His mother reports he is doing well on current meds      Protein-calorie malnutrition, severe ([place])    Is tolerating his feeds and doing well       I am having [name] "[name]" maintain his Omeprazole-Sodium Bicarbonate, NON FORMULARY, NON FORMULARY, Protective Barrier Wipes, Prevail Breezers Medium, NONFORMULARY OR COMPOUNDED ITEM, LORazepam, PARoxetine, AMBULATORY NON FORMULARY MEDICATION, mupirocin ointment, All-Body Massage, feeding supplement (KATE FARMS STANDARD 1.4), DuoDERM CGF Dressing, OLANZapine, oxybutynin, baclofen, dantrolene, Depakote Sprinkles, and sucralfate.  No orders of the defined types were placed in this encounter.   I discussed the assessment and treatment plan with the patient. The patient was provided an  opportunity to ask questions and all were answered. The patient agreed with the plan and demonstrated an understanding of the instructions.   The patient was advised to call back or seek an in-person evaluation if the symptoms worsen or if the condition fails to improve as anticipated.  I provided 10 minutes of face-to-face time during this encounter.   [name], MD [facility] [phone] (phone) [phone] (fax)  [place]

## 2021-11-14 NOTE — Telephone Encounter (Signed)
Spoke with patient's mother to inform her that the insurance company sent a letter stating they were no longer covering the Dantrolene. She stated that the patient has been having increased leg pain during the night. She has been giving him the massages and he still has the pain. She stated she called the pharmacy to get the Baclofen filled. She wants to know if it is okay for him to take the Baclofen and Dantrolene

## 2021-11-14 NOTE — Telephone Encounter (Signed)
The medications that insurance will cover are Baclofen, Cyclobenzaprine or Tizanidine

## 2021-11-15 ENCOUNTER — Encounter: Payer: Self-pay | Admitting: Physical Medicine & Rehabilitation

## 2021-11-26 ENCOUNTER — Other Ambulatory Visit: Payer: Self-pay

## 2021-11-26 ENCOUNTER — Encounter: Payer: Self-pay | Admitting: Physical Medicine & Rehabilitation

## 2021-11-26 ENCOUNTER — Encounter: Payer: Medicare Other | Attending: Physical Medicine & Rehabilitation | Admitting: Physical Medicine & Rehabilitation

## 2021-11-26 DIAGNOSIS — G8250 Quadriplegia, unspecified: Secondary | ICD-10-CM | POA: Diagnosis not present

## 2021-11-26 DIAGNOSIS — R252 Cramp and spasm: Secondary | ICD-10-CM | POA: Diagnosis not present

## 2021-11-26 NOTE — Patient Instructions (Signed)
PLEASE FEEL FREE TO CALL OUR OFFICE WITH ANY PROBLEMS OR QUESTIONS ([phone])      

## 2021-11-26 NOTE — Progress Notes (Signed)
Botox Injection for spasticity of upper extremity using needle EMG guidance Indication: Spastic quadriplegia (HCC)   Dilution: 100 Units/ml        Total Units Injected: 600 Indication: Severe spasticity which interferes with ADL,mobility and/or  hygiene and is unresponsive to medication management and other conservative care Informed consent was obtained after describing risks and benefits of the procedure with the patient. This includes bleeding, bruising, infection, excessive weakness, or medication side effects. A REMS form is on file and signed.  Needle: 31mm injectable monopolar needle electrode    Number of units per muscle Pectoralis Major 0 units Pectoralis Minor 0 units Biceps 100 units right 2 access points, left 100u 2 access points Brachioradialis 50 units 1 access point on right, 50 u left one access points FCR 20 units left, 20u right FCU 20 units left, 20u right FDS 45 units left, 45 u right FDP 45 units left, 45 u right FPL 0 units Palmaris Longus 20 units left, 20 units 4ight Pronator Teres 0 units Pronator Quadratus 0 units Lumbricals 0 units All injections were done after obtaining appropriate EMG activity and after negative drawback for blood. The patient tolerated the procedure well. Post procedure instructions were given. Return in about 3 months (around 02/24/2022).

## 2021-11-28 DIAGNOSIS — F339 Major depressive disorder, recurrent, unspecified: Secondary | ICD-10-CM | POA: Diagnosis not present

## 2021-12-01 ENCOUNTER — Other Ambulatory Visit: Payer: Self-pay | Admitting: Physical Medicine & Rehabilitation

## 2021-12-01 DIAGNOSIS — G8250 Quadriplegia, unspecified: Secondary | ICD-10-CM

## 2021-12-01 DIAGNOSIS — G809 Cerebral palsy, unspecified: Secondary | ICD-10-CM

## 2021-12-23 DIAGNOSIS — F339 Major depressive disorder, recurrent, unspecified: Secondary | ICD-10-CM | POA: Diagnosis not present

## 2021-12-24 ENCOUNTER — Other Ambulatory Visit (HOSPITAL_COMMUNITY): Payer: Self-pay | Admitting: Radiology

## 2021-12-24 DIAGNOSIS — R633 Feeding difficulties, unspecified: Secondary | ICD-10-CM

## 2021-12-31 ENCOUNTER — Other Ambulatory Visit: Payer: Self-pay

## 2021-12-31 ENCOUNTER — Ambulatory Visit (HOSPITAL_COMMUNITY)
Admission: RE | Admit: 2021-12-31 | Discharge: 2021-12-31 | Disposition: A | Discharge status: Home / Self Care | Payer: Medicare Other | Source: Ambulatory Visit | Attending: Radiology | Admitting: Radiology

## 2021-12-31 DIAGNOSIS — Z4659 Encounter for fitting and adjustment of other gastrointestinal appliance and device: Secondary | ICD-10-CM | POA: Diagnosis not present

## 2021-12-31 DIAGNOSIS — R633 Feeding difficulties, unspecified: Secondary | ICD-10-CM | POA: Insufficient documentation

## 2021-12-31 DIAGNOSIS — Z431 Encounter for attention to gastrostomy: Secondary | ICD-10-CM | POA: Diagnosis not present

## 2021-12-31 HISTORY — PX: IR GJ TUBE CHANGE: IMG1440

## 2021-12-31 MED ORDER — IOHEXOL 300 MG/ML  SOLN
50.0000 mL | Freq: Once | INTRAMUSCULAR | Status: AC | PRN
  Administered 2021-12-31: 15 mL

## 2021-12-31 MED ORDER — LIDOCAINE VISCOUS HCL 2 % MT SOLN
OROMUCOSAL | Status: AC
  Filled 2021-12-31: qty 15

## 2022-01-08 ENCOUNTER — Encounter: Payer: Self-pay | Admitting: Physical Medicine & Rehabilitation

## 2022-01-28 DIAGNOSIS — F339 Major depressive disorder, recurrent, unspecified: Secondary | ICD-10-CM | POA: Diagnosis not present

## 2022-01-30 ENCOUNTER — Telehealth: Payer: Self-pay | Admitting: Family Medicine

## 2022-01-30 NOTE — Telephone Encounter (Signed)
Pt's mom called to update Dr.Blyth on pt's current health status. There has been quite a change in pt's ability to notify his caregiver's when needing to go to the bathroom. They have started him on probiotics but it has only helped a little bit with the bowel incontinence. This is happening about 2-3 times a day. She is unsure if this is a part of the decline as he is getting older or what may be going on. She does not think he has any virus or anything like that at the moment. She would just like some advice on what Dr.Blyth would recommend, whether that is an OV, VV or what the case may be. All other concerns are currently being handled with a neuro psych. Please advise.  ?

## 2022-01-30 NOTE — Telephone Encounter (Signed)
Pt's mom advised of recommendations.  ?

## 2022-02-02 ENCOUNTER — Other Ambulatory Visit: Payer: Self-pay | Admitting: Physical Medicine & Rehabilitation

## 2022-02-02 DIAGNOSIS — G8250 Quadriplegia, unspecified: Secondary | ICD-10-CM

## 2022-02-02 DIAGNOSIS — G809 Cerebral palsy, unspecified: Secondary | ICD-10-CM

## 2022-02-03 ENCOUNTER — Encounter: Payer: Self-pay | Admitting: *Deleted

## 2022-02-03 ENCOUNTER — Telehealth: Payer: Self-pay | Admitting: *Deleted

## 2022-02-03 NOTE — Telephone Encounter (Signed)
Prior auth submitted for Dantrolene to [facility] via CoverMyMeds.  Approval received from 02/03/22 until further notice.  ?

## 2022-02-05 ENCOUNTER — Telehealth: Payer: Self-pay

## 2022-02-05 ENCOUNTER — Ambulatory Visit (INDEPENDENT_AMBULATORY_CARE_PROVIDER_SITE_OTHER): Payer: Medicare Other

## 2022-02-05 ENCOUNTER — Ambulatory Visit
Admission: EM | Admit: 2022-02-05 | Discharge: 2022-02-05 | Disposition: A | Discharge status: Home / Self Care | Payer: Medicare Other

## 2022-02-05 DIAGNOSIS — R918 Other nonspecific abnormal finding of lung field: Secondary | ICD-10-CM

## 2022-02-05 DIAGNOSIS — R0989 Other specified symptoms and signs involving the circulatory and respiratory systems: Secondary | ICD-10-CM

## 2022-02-05 MED ORDER — DOXYCYCLINE HYCLATE 100 MG PO CAPS: 100.0000 mg | ORAL_CAPSULE | Freq: Two times a day (BID) | ORAL | 0 refills | Status: DC

## 2022-02-05 MED ORDER — PREDNISONE 20 MG PO TABS: 20.0000 mg | ORAL_TABLET | Freq: Every day | ORAL | 0 refills | Status: DC

## 2022-02-05 NOTE — ED Provider Notes (Signed)
?[place] ? ? ? ?CSN: [id number] ?Arrival date & time: 02/05/22  1103 ? ? ?  ? ?History   ?Chief Complaint ?Chief Complaint  ?Patient presents with  ? Wheezing  ?  Coughing - [name] has quadriplegia cerebral palsy.  Our doctor office has no openings.  Need to load in Wheelchair van and drive over. - Entered by patient  ? ? ?HPI ?[name] is a 36 y.o. male.  ? ?Patient here today for evaluation of wheezing, increased congestion, and increased secretions that has been present for the last several days. He has been having chest pain and tightness.  ? ?The history is provided by the patient.  ?Wheezing ?Associated symptoms: cough   ?Associated symptoms: no fever   ? ?Past Medical History:  ?Diagnosis Date  ? Cerebral palsy ([place])   ? Dehydration 11/22/2013  ? Depression with anxiety 08/01/2010  ? Qualifier: Diagnosis of  By: Nelson-Smith CMA (AAMA), Dottie    ? Dyslipidemia 08/19/2017  ? Esophagitis 2011  ? Gastrostomy in place [facility]) 08/31/2013  ? GERD (gastroesophageal reflux disease)   ? Hyperlipidemia, mild 08/25/2015  ? Hyperthyroidism   ? Incontinence of feces   ? Loss of weight 08/28/2014  ? Medicare annual wellness visit, subsequent 08/25/2015  ? Mildly underweight adult 03/16/2017  ? Palpitations   ? Skin lesion of right ear 03/16/2017  ? Thyroid disease 08/01/2010  ? Qualifier: Diagnosis of  By: Nelson-Smith CMA (AAMA), Dottie    ? ? ?Patient Active Problem List  ? Diagnosis Date Noted  ? PVD (peripheral vascular disease) ([place]) 08/10/2021  ? Close exposure to COVID-19 virus 10/16/2019  ? Mildly underweight adult 03/16/2017  ? Skin lesion of right ear 03/16/2017  ? Increased oropharyngeal secretions 04/14/2016  ? Medicare annual wellness visit, subsequent 08/25/2015  ? Hyperlipidemia, mild 08/25/2015  ? Esophageal dysphagia 09/06/2014  ? Dysphagia, pharyngoesophageal phase 09/02/2014  ? Impetigo 09/02/2014  ? Abnormal thyroid function test 06/10/2014  ? Protein-calorie malnutrition, severe ([place])  11/23/2013  ? Decreased oral intake 11/22/2013  ? Gastrostomy in place [facility]) 08/31/2013  ? 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 Dysphagia, oropharyngeal phase 08/10/2013  ? Low back pain 07/11/2013  ? 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 Anemia 12/05/2010  ? 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 GASTROSTOMY COMPLICATION 61/95/0932  ? Infantile cerebral palsy ([place]) 08/04/2010  ? Thyroid disease 08/01/2010  ? Depression with anxiety 08/01/2010  ? GERD 08/01/2010  ? OTHER ACNE 10/16/2009  ? PALPITATIONS 02/25/2009  ? HIP PAIN, BILATERAL 11/06/2008  ? NEVI, MULTIPLE 10/09/2008  ? Incontinence of feces 04/02/2008  ? Urinary incontinence 04/02/2008  ? 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 FACIAL RASH 04/29/2007  ? PALSY, INFANTILE CEREBRAL, 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  ? ? ?Past Surgical History:  ?Procedure Laterality Date  ? baclofen trial    ? baslofen pump implant    ? ears tubes    ? EYE SURGERY    ? FLEXIBLE SIGMOIDOSCOPY N/A 09/07/2014  ? Procedure: FLEXIBLE SIGMOIDOSCOPY;  Surgeon: [name], MD;  Location: [facility] ENDOSCOPY;  Service: Endoscopy;  Laterality: N/A;  ? g-tube insert  August 2006  ? hamstring released    ? to treat contractures.   ? HIP SURGERY    ? x2 , side   ? IR CM INJ ANY COLONIC TUBE W/FLUORO  05/28/2017  ? IR CM INJ ANY COLONIC TUBE W/FLUORO  07/07/2019  ? IR CM INJ ANY COLONIC TUBE W/FLUORO  09/18/2019  ?  IR GASTR TUBE CONVERT GASTR-JEJ PER W/FL MOD SED  03/14/2019  ? IR GENERIC HISTORICAL  07/01/2016  ? IR GASTR TUBE CONVERT GASTR-JEJ PER W/FL MOD SED 07/01/2016 [name], MD [facility]  ? IR GENERIC HISTORICAL  07/08/2016  ? IR PATIENT EVAL TECH 0-60 MINS 07/08/2016 [name], MD [facility]  ? IR GENERIC HISTORICAL  07/14/2016  ? IR GJ TUBE CHANGE 07/14/2016 [name], MD [facility]  ? IR GENERIC HISTORICAL  07/21/2016  ? IR PATIENT EVAL TECH 0-60 MINS [facility]  ? IR GENERIC HISTORICAL  08/31/2016  ? IR REPLC DUODEN/JEJUNO TUBE PERCUT  W/FLUORO 08/31/2016 [name], MD [facility]  ? IR GENERIC HISTORICAL  09/03/2016  ? IR GJ TUBE CHANGE 09/03/2016 [name], MD [facility]  ? IR GENERIC HISTORICAL  09/10/2016  ? IR GASTR TUBE CONVERT GASTR-JEJ PER W/FL MOD SED 09/10/2016 [facility]  ? IR GENERIC HISTORICAL  09/16/2016  ? IR PATIENT EVAL TECH 0-60 MINS [facility]  ? IR GENERIC HISTORICAL  09/29/2016  ? IR GJ TUBE CHANGE 09/29/2016 [name], MD [facility]  ? IR GJ TUBE CHANGE  02/05/2017  ? IR GJ TUBE CHANGE  05/21/2017  ? IR GJ TUBE CHANGE  08/25/2017  ? IR GJ TUBE CHANGE  01/18/2018  ? IR GJ TUBE CHANGE  02/04/2018  ? IR GJ TUBE CHANGE  04/21/2018  ? IR GJ TUBE CHANGE  08/18/2018  ? IR GJ TUBE CHANGE  09/12/2019  ? IR GJ TUBE CHANGE  01/03/2020  ? IR GJ TUBE CHANGE  05/13/2020  ? IR GJ TUBE CHANGE  09/11/2020  ? IR GJ TUBE CHANGE  02/27/2021  ? IR GJ TUBE CHANGE  08/01/2021  ? IR [place] TUBE CHANGE  12/31/2021  ? IR MECH REMOV OBSTRUC MAT ANY COLON TUBE W/FLUORO  09/02/2018  ? IR REPLC GASTRO/COLONIC TUBE PERCUT W/FLUORO  10/17/2018  ? PEG PLACEMENT  10/21/2011  ? Procedure: PERCUTANEOUS ENDOSCOPIC GASTROSTOMY (PEG) REPLACEMENT;  Surgeon: [name], MD;  [facility] ENDOS[facility];  Service: Endoscopy;  Laterality: N/A;  ? PEG PLACEMENT N/A 06/13/2013  ? Procedure: PERCUTANEOUS ENDOSCOPIC GASTROSTOMY (PEG) REPLACEMENT;  Surgeon: [name], MD;  Location[facility] ENDOSCOPY;  Service: Endoscopy;  Laterality: N/A;  ? SPINAL FUSION    ? spinal fusion to correct 70 degree kyphosis  11-2010  ? spinal fusioncorrect 106 degree kyphosis    ? [place]  ,11/20/2010, 2011  ? for correction of severe contracturing spinal kyphosis.   ? TONSILLECTOMY    ? ? ? ? ? ?Home Medications   ? ?Prior to Admission medications   ?Medication Sig Start Date End Date Taking? Authorizing Provider  ?doxycycline (VIBRAMYCIN) 100 MG capsule Take 1 capsule (100 mg total) by mouth 2 (two) times daily. 02/05/22  Yes Francene Finders, PA-C  ?predniSONE (DELTASONE) 20 MG tablet Take  1 tablet (20 mg total) by mouth daily with breakfast for 5 days. 02/05/22 02/10/22 Yes Francene Finders, PA-C  ?AMBULATORY NON FORMULARY MEDICATION Medication Name: MIC gastrostomy/bolus feeding tube 24 French Part number 0110-24. #2 and ?10 cc lurer lock syringe #2 ?Dx: 10/15/15   [name], MD  ?baclofen (LIORESAL) 10 MG tablet Take 1 tablet (10 mg total) by mouth 2 (two) times daily. 1/2 tab twice daily for one week then 1 tab twice daily thereafter 03/26/21   Meredith Staggers, MD  ?Control Gel Formula Dressing (DUODERM CGF DRESSING) MISC Apply to affected area every other day or as  needed. 08/15/20   [name], MD  ?dantrolene (DANTRIUM) 50 MG capsule TAKE 1 CAPSULE BY MOUTH EVERY MORNING, 1 CAPSULE EVERY EVENING, AND 1 CAPSULE AT BEDTIME. 02/02/22   Meredith Staggers, MD  ?DEPAKOTE SPRINKLES 125 MG capsule Take by mouth. 06/13/21   [provider]  ?diazepam (VALIUM) 2 MG tablet Take 2 mg by mouth. 01/28/22   [provider]  ?diazepam (VALIUM) 5 MG tablet Take 5 mg by mouth 2 (two) times daily. 01/30/22   [provider]  ?DULoxetine (CYMBALTA) 20 MG capsule Take 20 mg by mouth every morning. 01/28/22   [provider]  ?Incontinence Supply Disposable (PREVAIL BREEZERS MEDIUM) MISC pkg of 16- size medium 32" to 44" ? ?Breathable cloth-like outer fabric (can't use the plastic outer surgace ? ?Item # [id number]/2 07/30/14   [name], MD  ?LORazepam (ATIVAN) 1 MG tablet Take one tablet in evenings as needed for insomnia. ?Patient taking differently: 1.5 mg. Take and one half tablet in evenings for insomnia. 06/04/15   [name], MD  ?Misc. Devices (ALL-BODY MASSAGE) MISC 1 Units/hr by Does not apply route as needed. Full body massage for Muscle spasticity due to Cerebral palsy 03/14/19   [name], MD  ?mupirocin ointment (BACTROBAN) 2 % Apply to area thin film twice daily if needed ?Patient taking differently: as needed. Apply to area thin film twice daily if  needed 09/02/16   Saguier, Percell Miller, PA-C  ?NON FORMULARY Bard Leg Bag Extension tubing w/Connector 18", Sterile, latex-free ? ?Item# [id number]    [provider]  ?NON FORMULARY Colorplast Freedom Cath Latex Se

## 2022-02-05 NOTE — ED Triage Notes (Signed)
Pt has been having increased congestion. Mother states that he has a airway vest and breasting treatment at home. Pt has had increased secretions and wheezing. Pt also st he has been having chest tightness and pain.  ?

## 2022-02-05 NOTE — Telephone Encounter (Signed)
Nurse Assessment ?Nurse: Thad Ranger RN, Denise Date/Time (Eastern Time): 02/05/2022 8:37:09 AM ?Confirm and document reason for call. If ?symptomatic, describe symptoms. ?---Caller states her son has nasal and chest congestion, ?cough, and was wheezing w/diff breathing last night. ?DENIES diff breathing this am. No fever. ?Does the patient have any new or worsening ?symptoms? ---Yes ?Will a triage be completed? ---Yes ?Related visit to physician within the last 2 weeks? ---Yes ?Does the PT have any chronic conditions? (i.e. ?diabetes, asthma, this includes High risk factors for ?pregnancy, etc.) ?---Yes ?List chronic conditions. ---CP, Quadraplegic, Anxiety (severe), G-Tube ?Is this a behavioral health or substance abuse call? ---No ?Guidelines ?Guideline Title Affirmed Question Affirmed Notes Nurse Date/Time (Eastern ?Time) ?Cough - Acute ?Productive ?Chest pain ?(Exception: MILD ?central chest pain, ?present only when ?coughing) ?Thad Ranger, RN, Langley Gauss 02/05/2022 8:40:44 AM ?Disp. Time (Eastern ?Time) Disposition Final User ?02/05/2022 8:34:22 AM Send to Urgent [place], [place] ?PLEASE NOTE: All timestamps contained within this report are represented as [place] Standard Time. ?CONFIDENTIALTY NOTICE: This fax transmission is intended only for the addressee. It contains information that is legally privileged, confidential or ?otherwise protected from use or disclosure. If you are not the intended recipient, you are strictly prohibited from reviewing, disclosing, copying using ?or disseminating any of this information or taking any action in reliance on or regarding this information. If you have received this fax in error, please ?notify us immediately by telephone so that we can arrange for its return to [place]. Phone: [phone], Toll-Free: [phone], Fax: [phone] ?Page: 2 of 2 ?Call Id: [id number] ?02/05/2022 8:42:40 AM Go to ED Now Yes Carmon, RN, Langley Gauss ?Caller Disagree/Comply Comply ?Caller Understands  Yes ?PreDisposition Call Doctor ?Care Advice Given Per Guideline ?GO TO ED NOW: ANOTHER ADULT SHOULD DRIVE: * It is better and safer if another adult drives instead of you. BRING ?MEDICINES: * Bring a list of your current medicines when you go to the Emergency Department (ER). CALL EMS 911 IF: * ?Severe difficulty breathing occurs CARE ADVICE given per Cough - Acute Productive (Adult) guideline. ?Referrals ?[facility] - ED ?

## 2022-02-05 NOTE — Telephone Encounter (Signed)
Pt currently being seen at urgent care. ?

## 2022-02-08 ENCOUNTER — Encounter: Payer: Self-pay | Admitting: Family Medicine

## 2022-02-10 DIAGNOSIS — F339 Major depressive disorder, recurrent, unspecified: Secondary | ICD-10-CM | POA: Diagnosis not present

## 2022-02-12 ENCOUNTER — Ambulatory Visit (INDEPENDENT_AMBULATORY_CARE_PROVIDER_SITE_OTHER): Payer: Medicare Other | Admitting: Family Medicine

## 2022-02-12 ENCOUNTER — Encounter: Payer: Self-pay | Admitting: Family Medicine

## 2022-02-12 VITALS — BP 117/96 | HR 90

## 2022-02-12 DIAGNOSIS — J189 Pneumonia, unspecified organism: Secondary | ICD-10-CM

## 2022-02-12 MED ORDER — AMOXICILLIN 875 MG PO TABS: 875.0000 mg | ORAL_TABLET | Freq: Two times a day (BID) | ORAL | 0 refills | Status: AC

## 2022-02-12 MED ORDER — PREDNISONE 20 MG PO TABS: ORAL_TABLET | ORAL | 0 refills | Status: AC

## 2022-02-12 NOTE — Progress Notes (Signed)
? ?Acute Office Visit ? ?Subjective:  ? ? Patient ID: [name], male    DOB: 06/21/1986, 36 y.o.   MRN: [id number] ? ?CC: urgent care follow-up ? ? ?HPI ?Patient is in today for urgent care follow-up ? ?02/05/22: Patient went to UC for cough and wheezing for several days.  ?CXR = new small opacity at left lung base.  ?He was treated with doxycycline and prednisone and told to follow-up with PCP or return to ED if worsening.  ? ?Today mom reports he is making progress, but continues to cough and wheeze more than baseline. He is finished with the prednisone, but has a few more days of doxycycline. They have noticed mild improvement, but not as much as she was expecting. His chest still sounds "rattly" to them and he needed 2 breathing treatments yesterday. They have an airway clearance vest and suction equipment at home if needed. He has not been wanting his tube feeds as often because of not feeling well, but has been staying hydrated.  ? ? ?Otherwise, mom reports he has been doing well. His special needs provider in [place] has been adjusting some of his medication and he seems to be getting responses from cymbalta and valium. They also mention that he has had some bowel incontinence the past several months, but seems to be slowly improving. Reports that usually he is able to request to get to the toilet. The past week has been much better despite being on antibiotics. Mom states that he has had a lot of anxiety the past several months, so that could be playing a role. They will continue monitoring and be sure he is making progress.  ? ? ? ? ?Past Medical History:  ?Diagnosis Date  ? Cerebral palsy ([place])   ? Dehydration 11/22/2013  ? Depression with anxiety 08/01/2010  ? Qualifier: Diagnosis of  By: Nelson-Smith CMA (AAMA), Dottie    ? Dyslipidemia 08/19/2017  ? Esophagitis 2011  ? 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 GERD (gastroesophageal reflux disease)   ? Hyperlipidemia, mild 08/25/2015  ? Hyperthyroidism    ? Incontinence of feces   ? Loss of weight 08/28/2014  ? Medicare annual wellness visit, subsequent 08/25/2015  ? Mildly underweight adult 03/16/2017  ? Palpitations   ? Skin lesion of right ear 03/16/2017  ? Thyroid disease 08/01/2010  ? Qualifier: Diagnosis of  By: Nelson-Smith CMA (AAMA), Dottie    ? ? ?Past Surgical History:  ?Procedure Laterality Date  ? baclofen trial    ? baslofen pump implant    ? ears tubes    ? EYE SURGERY    ? FLEXIBLE SIGMOIDOSCOPY N/A 09/07/2014  ? Procedure: FLEXIBLE SIGMOIDOSCOPY;  Surgeon: [name], MD;  Location: [facility] ENDOSCOPY;  Service: Endoscopy;  Laterality: N/A;  ? g-tube insert  August 2006  ? hamstring released    ? to treat contractures.   ? HIP SURGERY    ? x2 , side   ? IR CM INJ ANY COLONIC TUBE W/FLUORO  05/28/2017  ? IR CM INJ ANY COLONIC TUBE W/FLUORO  07/07/2019  ? IR CM INJ ANY COLONIC TUBE W/FLUORO  09/18/2019  ? IR GASTR TUBE CONVERT GASTR-JEJ PER W/FL MOD SED  03/14/2019  ? IR GENERIC HISTORICAL  07/01/2016  ? IR GASTR TUBE CONVERT GASTR-JEJ PER W/FL MOD SED 07/01/2016 [name], MD [facility]  ? IR GENERIC HISTORICAL  07/08/2016  ? IR PATIENT EVAL TECH 0-60 MINS 07/08/2016 [name], MD [facility]  ?  IR GENERIC HISTORICAL  07/14/2016  ? IR GJ TUBE CHANGE 07/14/2016 [name], MD [facility]  ? IR GENERIC HISTORICAL  07/21/2016  ? IR PATIENT EVAL TECH 0-60 MINS [facility]  ? IR GENERIC HISTORICAL  08/31/2016  ? IR REPLC DUODEN/JEJUNO TUBE PERCUT W/FLUORO 08/31/2016 [name], MD [facility]  ? IR GENERIC HISTORICAL  09/03/2016  ? IR GJ TUBE CHANGE 09/03/2016 [name], MD [facility]  ? IR GENERIC HISTORICAL  09/10/2016  ? IR GASTR TUBE CONVERT GASTR-JEJ PER W/FL MOD SED 09/10/2016 [facility]  ? IR GENERIC HISTORICAL  09/16/2016  ? IR PATIENT EVAL TECH 0-60 MINS [facility]  ? IR GENERIC HISTORICAL  09/29/2016  ? IR GJ TUBE CHANGE 09/29/2016 [name], MD [facility]  ? IR GJ TUBE CHANGE  02/05/2017  ? IR GJ TUBE CHANGE  05/21/2017  ? IR GJ  TUBE CHANGE  08/25/2017  ? IR GJ TUBE CHANGE  01/18/2018  ? IR GJ TUBE CHANGE  02/04/2018  ? IR GJ TUBE CHANGE  04/21/2018  ? IR GJ TUBE CHANGE  08/18/2018  ? IR GJ TUBE CHANGE  09/12/2019  ? IR GJ TUBE CHANGE  01/03/2020  ? IR GJ TUBE CHANGE  05/13/2020  ? IR GJ TUBE CHANGE  09/11/2020  ? IR GJ TUBE CHANGE  02/27/2021  ? IR GJ TUBE CHANGE  08/01/2021  ? IR [place] TUBE CHANGE  12/31/2021  ? IR MECH REMOV OBSTRUC MAT ANY COLON TUBE W/FLUORO  09/02/2018  ? IR REPLC GASTRO/COLONIC TUBE PERCUT W/FLUORO  10/17/2018  ? PEG PLACEMENT  10/21/2011  ? Procedure: PERCUTANEOUS ENDOSCOPIC GASTROSTOMY (PEG) REPLACEMENT;  Surgeon: [name], MD;  [facility] ENDOSCOPY;  Service: Endoscopy;  Laterality: N/A;  ? PEG PLACEMENT N/A 06/13/2013  ? Procedure: PERCUTANEOUS ENDOSCOPIC GASTROSTOMY (PEG) REPLACEMENT;  Surgeon: [name], MD;  [facility] ENDOSCOPY;  Service: Endoscopy;  Laterality: N/A;  ? SPINAL FUSION    ? spinal fusion to correct 70 degree kyphosis  11-2010  ? spinal fusioncorrect 106 degree kyphosis    ? [place]  ,11/20/2010, 2011  ? for correction of severe contracturing spinal kyphosis.   ? TONSILLECTOMY    ? ? ?Family History  ?Problem Relation Age of Onset  ? Asthma Mother   ? Hyperlipidemia Mother   ? COPD Mother   ? Other Mother   ?     bronchial stasis/ABPA  ? Cancer Maternal Grandmother 20  ?     breast  ? Hyperlipidemia Maternal Grandmother   ? Hypertension Maternal Grandmother   ? Cancer Maternal Grandfather   ?     prostate  ? Heart disease Paternal Grandfather   ?     CHF  ? Osteoporosis Paternal Grandmother   ? Arthritis Paternal Grandmother   ?     rheumatoid  ? ? ?Social History  ? ?Socioeconomic History  ? Marital status: Single  ?  Spouse name: Not on file  ? Number of children: 0  ? Years of education: Not on file  ? Highest education level: Not on file  ?Occupational History  ? Occupation: disbaled  ?Tobacco Use  ? Smoking status: Never  ? Smokeless tobacco: Never  ?Vaping Use  ? Vaping Use: Never used   ?Substance and Sexual Activity  ? Alcohol use: No  ? Drug use: No  ? Sexual activity: Never  ?Other Topics Concern  ? Not on file  ?Social History Narrative  ? Not on file  ? ?Social  Determinants of Health  ? ?Financial Resource Strain: Not on file  ?Food Insecurity: Not on file  ?Transportation Needs: Not on file  ?Physical Activity: Not on file  ?Stress: Not on file  ?Social Connections: Not on file  ?Intimate Partner Violence: Not on file  ? ? ?Outpatient Medications Prior to Visit  ?Medication Sig Dispense Refill  ? AMBULATORY NON FORMULARY MEDICATION Medication Name: MIC gastrostomy/bolus feeding tube 24 French Part number 0110-24. #2 and ?10 cc lurer lock syringe #2 ?Dx: 4 Device 2  ? baclofen (LIORESAL) 10 MG tablet Take 1 tablet (10 mg total) by mouth 2 (two) times daily. 1/2 tab twice daily for one week then 1 tab twice daily thereafter 60 tablet 3  ? Control Gel Formula Dressing (DUODERM CGF DRESSING) MISC Apply to affected area every other day or as needed. 30 each 0  ? dantrolene (DANTRIUM) 50 MG capsule TAKE 1 CAPSULE BY MOUTH EVERY MORNING, 1 CAPSULE EVERY EVENING, AND 1 CAPSULE AT BEDTIME. 90 capsule 1  ? DEPAKOTE SPRINKLES 125 MG capsule Take by mouth.    ? diazepam (VALIUM) 2 MG tablet Take 2 mg by mouth.    ? diazepam (VALIUM) 5 MG tablet Take 5 mg by mouth 2 (two) times daily.    ? doxycycline (VIBRAMYCIN) 100 MG capsule Take 1 capsule (100 mg total) by mouth 2 (two) times daily. 20 capsule 0  ? DULoxetine (CYMBALTA) 20 MG capsule Take 20 mg by mouth every morning.    ? Incontinence Supply Disposable (PREVAIL BREEZERS MEDIUM) MISC pkg of 16- size medium 32" to 44" ? ?Breathable cloth-like outer fabric (can't use the plastic outer surgace ? ?Item # PVB-012/2 16 each 6  ? LORazepam (ATIVAN) 1 MG tablet Take one tablet in evenings as needed for insomnia. (Patient taking differently: 1.5 mg. Take and one half tablet in evenings for insomnia.) 30 tablet 1  ? Misc. Devices (ALL-BODY MASSAGE) MISC 1  Units/hr by Does not apply route as needed. Full body massage for Muscle spasticity due to Cerebral palsy 99 each 99  ? mupirocin ointment (BACTROBAN) 2 % Apply to area thin film twice daily if needed [place]

## 2022-02-12 NOTE — Patient Instructions (Signed)
Will try adding a few more days of prednisone taper and add plain amoxicillin (not quite as strong as Augmentin, but he does not tolerate that well). ?Continue to monitor closely and follow-up if not improving.  ?

## 2022-02-16 ENCOUNTER — Telehealth: Payer: Medicare Other | Admitting: Family Medicine

## 2022-02-16 ENCOUNTER — Ambulatory Visit: Payer: Medicare Other | Admitting: Medical

## 2022-03-03 ENCOUNTER — Encounter: Payer: Self-pay | Admitting: Family Medicine

## 2022-03-03 ENCOUNTER — Ambulatory Visit (HOSPITAL_BASED_OUTPATIENT_CLINIC_OR_DEPARTMENT_OTHER)
Admission: RE | Admit: 2022-03-03 | Discharge: 2022-03-03 | Disposition: A | Discharge status: Home / Self Care | Payer: Medicare Other | Source: Ambulatory Visit | Attending: Physician Assistant | Admitting: Physician Assistant

## 2022-03-03 ENCOUNTER — Ambulatory Visit (INDEPENDENT_AMBULATORY_CARE_PROVIDER_SITE_OTHER): Payer: Medicare Other | Admitting: Family Medicine

## 2022-03-03 VITALS — BP 108/72 | HR 99 | Temp 98.0°F

## 2022-03-03 DIAGNOSIS — R058 Other specified cough: Secondary | ICD-10-CM

## 2022-03-03 DIAGNOSIS — R053 Chronic cough: Secondary | ICD-10-CM | POA: Insufficient documentation

## 2022-03-03 DIAGNOSIS — J189 Pneumonia, unspecified organism: Secondary | ICD-10-CM | POA: Diagnosis not present

## 2022-03-03 DIAGNOSIS — J690 Pneumonitis due to inhalation of food and vomit: Secondary | ICD-10-CM | POA: Diagnosis not present

## 2022-03-03 DIAGNOSIS — R059 Cough, unspecified: Secondary | ICD-10-CM | POA: Diagnosis not present

## 2022-03-03 MED ORDER — ALBUTEROL SULFATE (2.5 MG/3ML) 0.083% IN NEBU: 2.5000 mg | INHALATION_SOLUTION | Freq: Four times a day (QID) | RESPIRATORY_TRACT | 5 refills | Status: DC | PRN

## 2022-03-03 MED ORDER — AZITHROMYCIN 200 MG/5ML PO SUSR: 250.0000 mg | Freq: Every day | ORAL | 0 refills | Status: AC

## 2022-03-03 NOTE — Patient Instructions (Addendum)
Repeat chest xray today  ?Azithromycin per tube x7 days ?Albuterol nebs as needed  ?Continue vest twice daily ?Mucinex liquid per tube as needed ?Referral to reestablish with pulmonology  ? ?

## 2022-03-03 NOTE — Progress Notes (Signed)
? ?Acute Office Visit ? ?Subjective:  ? ?  ?Patient ID: [name], male    DOB: 1986-06-19, 36 y.o.   MRN: [id number] ? ?CC: productive cough ? ? ?HPI ?Patient is in today for productive cough and fevers.  ? ? ?02/05/22: Patient went to UC for cough and wheezing for several days.  ?CXR = new small opacity at left lung base.  ?He was treated with doxycycline and prednisone and told to follow-up with PCP or return to ED if worsening.  ? ?02/12/22: Patient saw me for PNA that was not resolving. He was given a prednisone taper and amoxicillin (could not tolerate Augmentin) and conitnue with home supportive measures.  ? ?Dad and caregiver present for visit today.  ? ?They state that after finishing last round of antibiotics and steroids, he was feeling much better and acting like himself again. Last week the nurse from [facility] said lungs sounded great. However, over this weekend they did start to notice a slight change in his sputum production although he continued to act normal. Yesterday around 2:00 pm he started with worsening cough and yellow sputum. He seemed to be going downhill - was needing more frequent suctioning and by that evening he was refusing his feeds and clearance vest and complaining of abdominal discomfort. Mom was able to give him carafate and gatorade and an albuterol neb. During the night he rand a 102.88F and was continuing with congested cough. Tylenol resolved the fever overnight, but by 8:00 am fever was back to 102F and they have continued increased suctioning of yellow secretions. He has not had any chest pain, audible wheezing, blood in sputum, diarrhea, constipation, nausea, vomiting, confusion.  ? ? ? ? ? ?ROS ?All review of systems negative except what is listed in the HPI ? ? ?   ?Objective:  ?  ?BP 108/72   Pulse 99   Temp 98 ?F (36.7 ?C) (Axillary)   SpO2 96%  ? ? ?Physical Exam ?Vitals reviewed.  ?Constitutional:   ?   General: He is not in acute distress. ?   Appearance: Normal  appearance. He is not ill-appearing.  ?Cardiovascular:  ?   Rate and Rhythm: Normal rate and regular rhythm.  ?Pulmonary:  ?   Effort: Pulmonary effort is normal.  ?   Breath sounds: No rales.  ?Abdominal:  ?   General: Abdomen is flat. Bowel sounds are normal. There is no distension.  ?   Palpations: Abdomen is soft.  ?   Tenderness: There is no guarding.  ?Skin: ?   General: Skin is warm and dry.  ?Neurological:  ?   Mental Status: He is alert. Mental status is at baseline.  ? ? ?No results found for any visits on 03/03/22. ? ? ?   ?Assessment & Plan:  ? ?1. Cough productive of purulent sputum ?Repeat chest xray today given such frequent recurrence of symptoms ?Azithromycin per tube x7 days ?Albuterol nebs as needed  ?Continue vest twice daily ?Mucinex liquid per tube as needed ?Referral to reestablish with pulmonology  ?No significant wheezing today - will try to hold off on steroids for now since he just had two round within the past month - encouraged them to monitor and let us know if anything changes.  ? ?- azithromycin (ZITHROMAX) 200 MG/5ML suspension; Place 6.3 mLs (250 mg total) into feeding tube daily for 7 days.  Dispense: 44.1 mL; Refill: 0 ?- Ambulatory referral to Pulmonology ?- DG Chest 2 View; Future ?-  albuterol (PROVENTIL) (2.5 MG/3ML) 0.083% nebulizer solution; Take 3 mLs (2.5 mg total) by nebulization every 6 (six) hours as needed for shortness of breath.  Dispense: 75 mL; Refill: 5 ? ? ?Patient aware of signs/symptoms requiring further/urgent evaluation.  ? ?Return if symptoms worsen or fail to improve. ? ?Terrilyn Saver, NP ? ? ?

## 2022-03-04 ENCOUNTER — Other Ambulatory Visit: Payer: Self-pay | Admitting: Family Medicine

## 2022-03-04 MED ORDER — CEFDINIR 250 MG/5ML PO SUSR: 600.0000 mg | Freq: Every day | ORAL | 0 refills | Status: AC

## 2022-03-07 IMAGING — XA IR REPLACE G/J TUBE W/ FLUORO
1 series · 4 of 4 positions shown · non-contrast
Comparison: none

INDICATION: Chronic gastrojejunostomy tube, need for routine exchange

[Series 1: processed: ir gj tube change · 4 of 24 frames shown]
[frame 4/24]
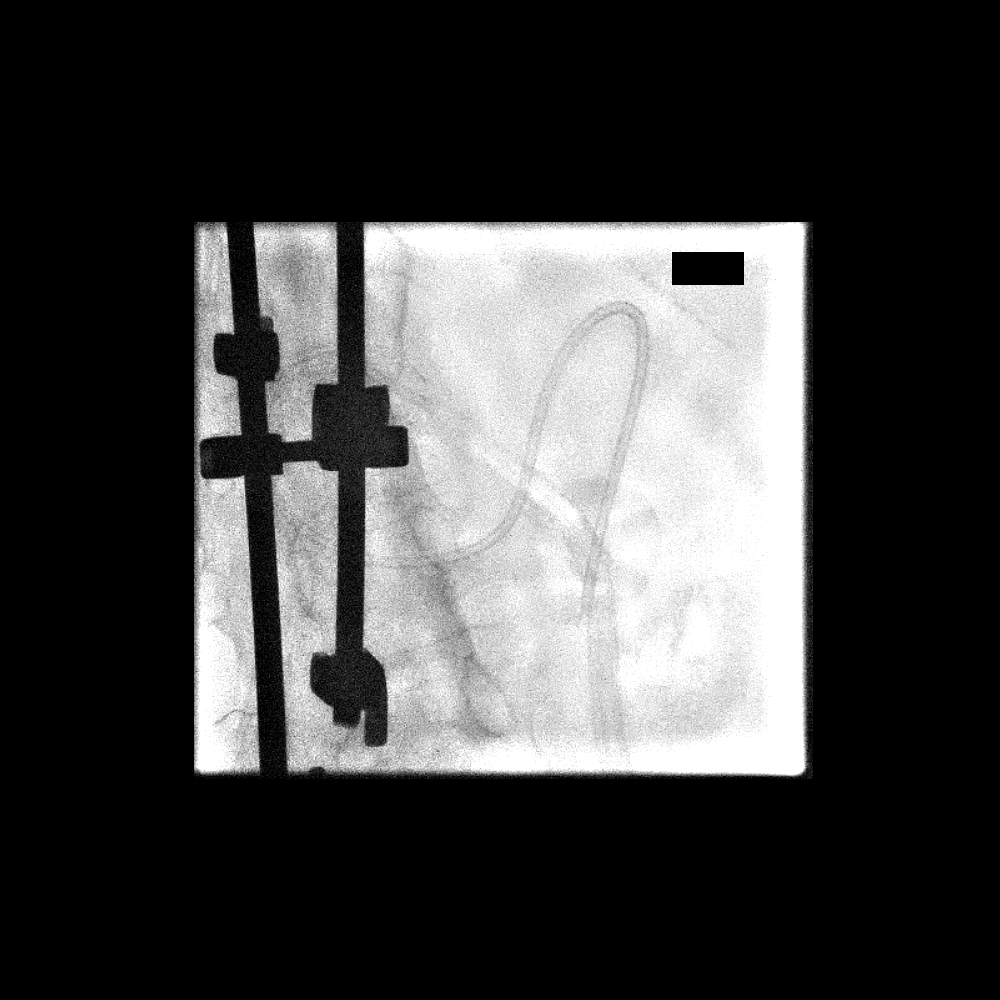
[frame 7/24]
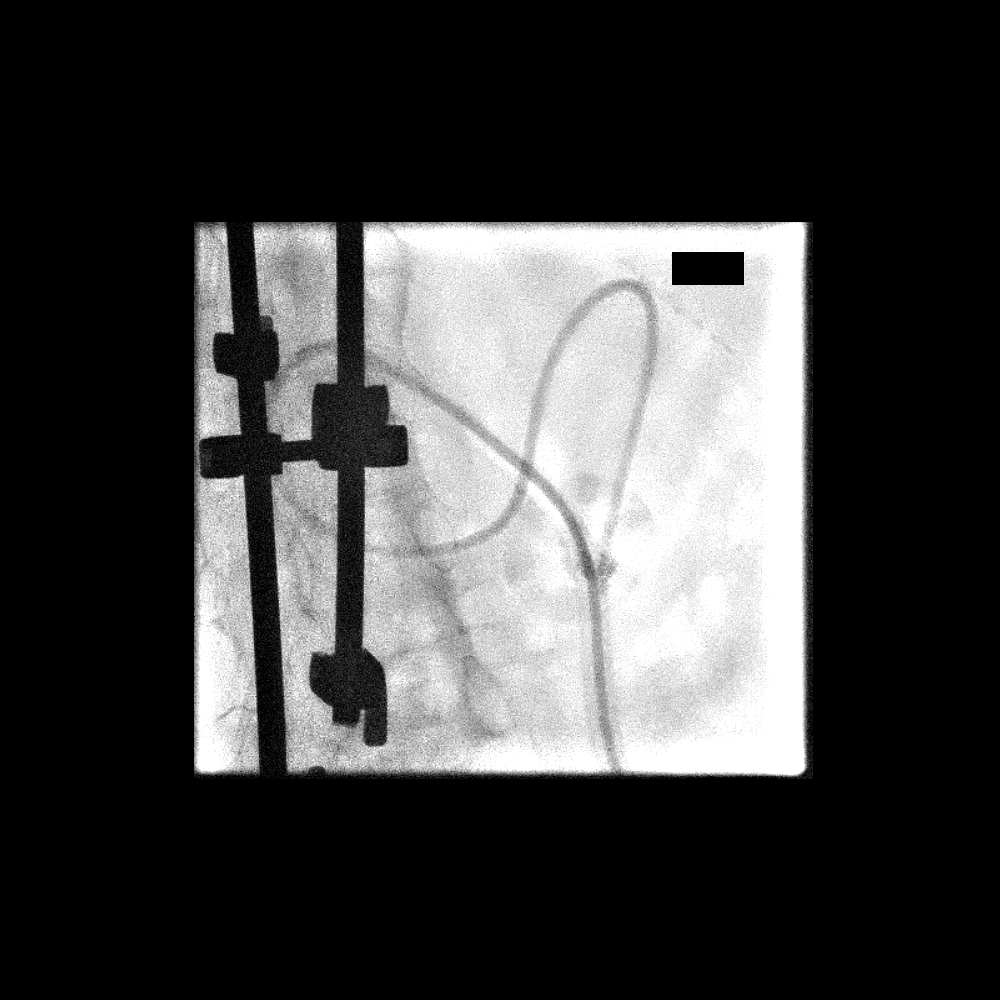
[frame 13/24]
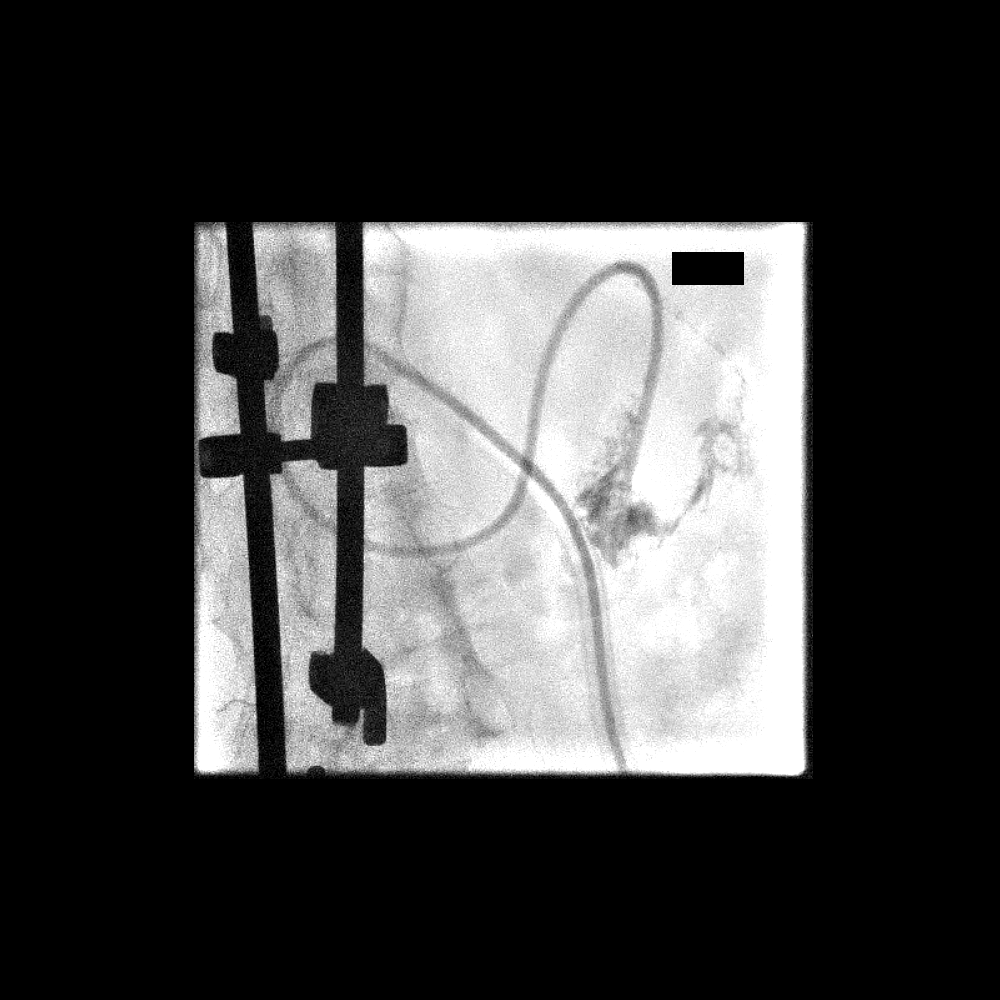
[frame 21/24]
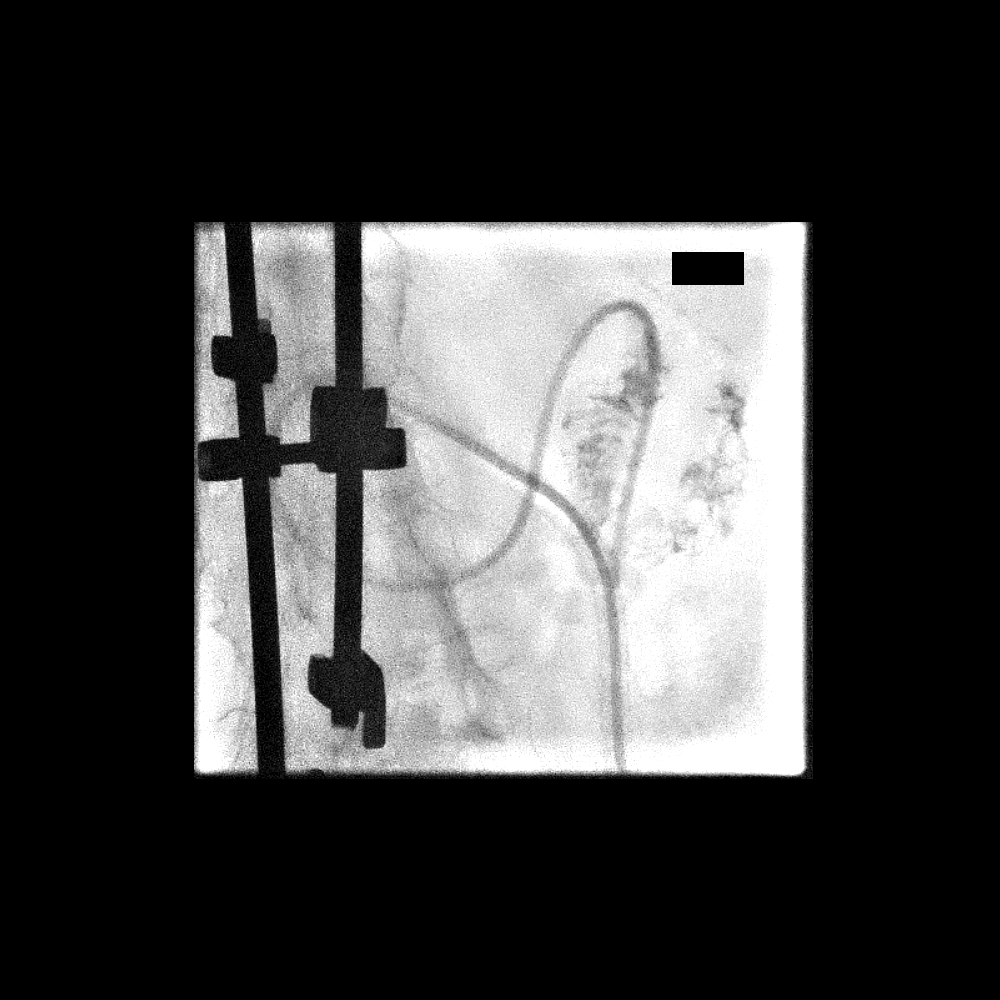

[4 of 4 positions shown; findings below may reference images not displayed]

EXAM:
Exchange of gastrojejunostomy tube using fluoroscopic guidance

MEDICATIONS:
None

ANESTHESIA/SEDATION:
Local analgesia

CONTRAST:  10 mL-administered into the gastric lumen.

FLUOROSCOPY TIME:  Fluoroscopy Time: 3 minutes 18 seconds (13 mGy).

COMPLICATIONS:
None immediate.

PROCEDURE:
Informed written consent was obtained from the patient after a
thorough discussion of the procedural risks, benefits and
alternatives. All questions were addressed. Maximal Sterile Barrier
Technique was utilized including caps, mask, sterile gowns, sterile
gloves, sterile drape, hand hygiene and skin antiseptic. A timeout
was performed prior to the initiation of the procedure.

The patient was placed supine on the exam table. The mid abdomen was
prepped and draped in a standard sterile fashion with inclusion of
the existing gastrojejunostomy tube within the sterile field.
Injection of the existing gastrojejunostomy tube demonstrated
appropriate position of the tip catheter within proximal jejunum
past the ligament of Treitz. Decision was made to proceed with
catheter exchange. Using combination of a stiff Glidewire and 4
French glide catheter, the wire was advanced into the mid jejunum.
The retention balloon was deflated, and the existing
gastrojejunostomy tube was exchanged for a new 24 French
gastrojejunostomy tube. The new tube was cut to a similar length of
the previous tube. The tip of the catheter was advanced into the
proximal jejunum past the ligament of Treitz. The balloon was
inflated with approximately 9 mL of sterile water. He was brought
back to the gastric wall. The external bumper was brought down to
the skin. Contrast injection of the new tube demonstrated
appropriate position within the proximal jejunum past the ligament
of Treitz. The patient tolerated the procedure well without
immediate complication.
IMPRESSION: Successful exchange of the gastrojejunostomy tube for a new, similar
24 French gastrojejunostomy tube with the tip in the proximal
jejunum past the ligament Treitz. The tube is ready for immediate
use.

## 2022-03-11 ENCOUNTER — Encounter: Payer: Self-pay | Admitting: Physical Medicine & Rehabilitation

## 2022-03-11 ENCOUNTER — Telehealth (INDEPENDENT_AMBULATORY_CARE_PROVIDER_SITE_OTHER): Payer: Medicare Other | Admitting: Family Medicine

## 2022-03-11 ENCOUNTER — Encounter: Payer: Medicare Other | Attending: Physical Medicine & Rehabilitation | Admitting: Physical Medicine & Rehabilitation

## 2022-03-11 ENCOUNTER — Encounter: Payer: Self-pay | Admitting: Family Medicine

## 2022-03-11 VITALS — BP 132/90 | HR 81 | Temp 98.4°F | Ht 60.0 in

## 2022-03-11 DIAGNOSIS — R058 Other specified cough: Secondary | ICD-10-CM | POA: Diagnosis not present

## 2022-03-11 DIAGNOSIS — M62838 Other muscle spasm: Secondary | ICD-10-CM

## 2022-03-11 DIAGNOSIS — G8250 Quadriplegia, unspecified: Secondary | ICD-10-CM | POA: Insufficient documentation

## 2022-03-11 NOTE — Progress Notes (Signed)
Virtual Video Visit via MyChart Note ? ?I connected with  [name] on 03/11/22 at  1:40 PM EDT by the video enabled telemedicine application for MyChart, and verified that I am speaking with the correct person using two identifiers. ?  ?I introduced myself as a Designer, jewellery with the practice. We discussed the limitations of evaluation and management by telemedicine and the availability of in person appointments. The patient expressed understanding and agreed to proceed. ? ?Participating parties in this visit include: The patient and the nurse practitioner listed; patient's mom present.  ?The patient is: At home ?I am: In the office - [facility] ? ?Subjective:   ? ?CC: cough ? ? ?HPI: [name] is a 36 y.o. year old male presenting today via [place] today for ongoing cough. ? ?Patient with recent history of pneumonia - has finished 2 courses of steroids and 4 antibiotics. Mom states that last night around 8pm his temp got up to 102F so they gave him some tylenol and extra hydration through feeding tube. He had also been continuing to cough up purulent sputum since most recent pneumonia infection. He finished antibiotics yesterday. Mom states that today he was able to use his airway clearance vest and do some suctioning and he has been feeling much better. He does not have any current complaints and has been without fever and tylenol since last night. He is not having any chest "rattling or wheezing" that is audible to family/caregivers. Energy/activity level is at baseline now.  ? ?They did hear back about the pulm referral and are planning to be seen at the end of this month.  ? ? ?Recently had a change to his mental health meds - he had been having more episodes of agitation - long episode of agitation yesterday. Neuropsych in [place] doubled cymbalta to 60 mg but he is doing much better with this and muscles are more relaxed already.  ? ? ? ?Past medical history,  Surgical history, Family history not pertinant except as noted below, Social history, Allergies, and medications have been entered into the medical record, reviewed, and corrections made.  ? ?Review of Systems:  ?All review of systems negative except what is listed in the HPI ? ? ?Objective:   ? ?General:  ?Speaking clearly in complete sentences. ?Absent shortness of breath noted.   ?Alert and oriented x3.   ?Normal judgment.  ?Absent acute distress. ? ? ?Impression and Recommendations:   ? ?1. Cough productive of purulent sputum ?No more fevers and acting/feeling significantly better since last night. Just finished PNA antibiotics yesterday. For now, recommending they keep him well hydrated and monitor for return of any new symptoms. Continue Mucinex, airway clearance vest, etc. If he continues coughing up purulent sputum, they can try collecting a sample so we can test to see if he needs more/different antibiotics. Will hold off for now given significant turn/progress today and recent abx/steroids.  ?Patient aware of signs/symptoms requiring further/urgent evaluation. ? ? ? ?Follow-up if symptoms worsen or fail to improve.  ?  ?I discussed the assessment and treatment plan with the patient. The patient was provided an opportunity to ask questions and all were answered. The patient agreed with the plan and demonstrated an understanding of the instructions. ?  ?The patient was advised to call back or seek an in-person evaluation if the symptoms worsen or if the condition fails to improve as anticipated. ? ?I spent 20 minutes dedicated to the  care of this patient on the date of this encounter to include pre-visit chart review of prior notes and results, face-to-face time with the patient, and post-visit ordering of testing as indicated.  ? ?Terrilyn Saver, NP  ? ?

## 2022-03-11 NOTE — Patient Instructions (Signed)
PLEASE FEEL FREE TO CALL OUR OFFICE WITH ANY PROBLEMS OR QUESTIONS ([phone])      

## 2022-03-11 NOTE — Progress Notes (Signed)
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 ?G82.50 ? ?Dilution: 100 Units/ml        Total Units Injected: 600 ?Indication: Severe spasticity which interferes with ADL,mobility and/or  hygiene and is unresponsive to medication management and other conservative care ?Informed consent was obtained after describing risks and benefits of the procedure with the patient. This includes bleeding, bruising, infection, excessive weakness, or medication side effects. A REMS form is on file and signed. ? ? ?Needle: 62m injectable monopolar needle electrode ? ? ?bilateral ?Number of units per muscle ? ?Quadriceps 300 units right and 300 units left 6 access each ?Gastroc/soleus 0 units ?Hamstrings 0 units ?Tibialis Posterior 0 units ?Tibialis Anterior 0 units ?EHL 0 units ?All injections were done after obtaining appropriate EMG activity and after negative drawback for blood. The patient tolerated the procedure well. Post procedure instructions were given. No follow-ups on file.  ?

## 2022-03-12 ENCOUNTER — Other Ambulatory Visit: Payer: Medicare Other

## 2022-03-12 ENCOUNTER — Ambulatory Visit: Payer: Medicare Other | Admitting: Family Medicine

## 2022-03-12 ENCOUNTER — Other Ambulatory Visit: Payer: Self-pay | Admitting: Family Medicine

## 2022-03-12 DIAGNOSIS — R058 Other specified cough: Secondary | ICD-10-CM

## 2022-03-12 DIAGNOSIS — J159 Unspecified bacterial pneumonia: Secondary | ICD-10-CM

## 2022-03-15 LAB — RESPIRATORY CULTURE OR RESPIRATORY AND SPUTUM CULTURE
MICRO NUMBER:: 13383104
SPECIMEN QUALITY:: ADEQUATE

## 2022-03-17 NOTE — Progress Notes (Signed)
Note sent to pulmonologist regarding sputum culture and upcoming appointment. Requesting advise on next steps given patient has already had 4 courses of antibiotics and 2 rounds of steroids. Have not yet heard back, so will go ahead and place an infectious disease referral to see if he can be seen and antibiotics adjusted soon.  ? ?Purcell Nails. Olevia Bowens, DNP, FNP-C ? ?

## 2022-03-17 NOTE — Addendum Note (Signed)
Addended by: Caleen Jobs B on: 03/17/2022 03:44 PM ? ? Modules accepted: Orders ? ?

## 2022-03-18 ENCOUNTER — Encounter: Payer: Self-pay | Admitting: Family Medicine

## 2022-03-24 DIAGNOSIS — F339 Major depressive disorder, recurrent, unspecified: Secondary | ICD-10-CM | POA: Diagnosis not present

## 2022-03-26 ENCOUNTER — Ambulatory Visit (INDEPENDENT_AMBULATORY_CARE_PROVIDER_SITE_OTHER): Payer: Medicare Other

## 2022-03-26 ENCOUNTER — Ambulatory Visit (INDEPENDENT_AMBULATORY_CARE_PROVIDER_SITE_OTHER): Payer: Medicare Other | Admitting: Pulmonary Disease

## 2022-03-26 ENCOUNTER — Encounter: Payer: Self-pay | Admitting: Pulmonary Disease

## 2022-03-26 VITALS — BP 124/64 | HR 88 | Wt 92.0 lb

## 2022-03-26 DIAGNOSIS — J189 Pneumonia, unspecified organism: Secondary | ICD-10-CM

## 2022-03-26 DIAGNOSIS — J984 Other disorders of lung: Secondary | ICD-10-CM | POA: Diagnosis not present

## 2022-03-26 DIAGNOSIS — R059 Cough, unspecified: Secondary | ICD-10-CM

## 2022-03-26 DIAGNOSIS — M419 Scoliosis, unspecified: Secondary | ICD-10-CM | POA: Diagnosis not present

## 2022-03-26 DIAGNOSIS — J90 Pleural effusion, not elsewhere classified: Secondary | ICD-10-CM | POA: Diagnosis not present

## 2022-03-26 MED ORDER — CIPROFLOXACIN HCL 750 MG PO TABS: 750.0000 mg | ORAL_TABLET | Freq: Two times a day (BID) | ORAL | 0 refills | Status: DC

## 2022-03-26 MED ORDER — HYDROCODONE BIT-HOMATROP MBR 5-1.5 MG/5ML PO SOLN: 5.0000 mL | Freq: Four times a day (QID) | ORAL | 0 refills | Status: DC | PRN

## 2022-03-26 NOTE — Patient Instructions (Signed)
We started on an antibiotic called ciprofloxacin 750 mg by mouth every 12 hours for seven days I will call in a cough syrup We will get a chest x-ray today Continue the percussion vest Recheck sputum cultures  Follow up in 3 months

## 2022-03-26 NOTE — Progress Notes (Signed)
[name]    [id number]    [birth year]  Primary Care Physician:Blyth, Bonnita Levan, MD  Referring Physician: Terrilyn Saver, NP [address] [place],  [place] [address]  Chief complaint: Consult for recurrent pneumonias  HPI: 36 year old with history of cerebral palsy, recurrent pneumonia is due to poor secretion clearance. He is wheelchair-bound and needs assistance for all activities of daily living. History notable for T9 - C6 spine fusion at [facility] in September 2011, peg tube placed in in 2012 for hydration.  He has previously followed with Dr. Elsworth Soho but not seen back in clinic for the past four years. He percussion vest at home and suction devices to help with mucus related clearance. He also uses Mucinex DS. Followed by palliative care at home with a DNR order. Family wishes that he stay out of the hospital as much as possible as he has difficult time with hospitalizations and would prefer to do as much of medical care at home  He has been struggling with pneumonia for the past month or two. He has taken 2 rounds of steroids, doxycycline, amoxicillin, azithromycin and omnicef from primary care office. Chest x-ray and early May showed right lower lobe infiltrate. Sputum cultures is growing pan sensitive Pseudomonas and streptococcus agalactiae  Continues to have recurrent cough with congestion and mucus production. No fevers or chills  Outpatient Encounter Medications as of 03/26/2022  Medication Sig   albuterol (PROVENTIL) (2.5 MG/3ML) 0.083% nebulizer solution Take 3 mLs (2.5 mg total) by nebulization every 6 (six) hours as needed for shortness of breath.   AMBULATORY NON FORMULARY MEDICATION Medication Name: MIC gastrostomy/bolus feeding tube 24 French Part number 0110-24. #2 and 10 cc lurer lock syringe #2 Dx:   CYMBALTA 30 MG capsule Take 30 mg by mouth every morning.   CYMBALTA 60 MG capsule Take 60 mg by mouth every morning.   dantrolene (DANTRIUM)  50 MG capsule TAKE 1 CAPSULE BY MOUTH EVERY MORNING, 1 CAPSULE EVERY EVENING, AND 1 CAPSULE AT BEDTIME.   DEPAKOTE SPRINKLES 125 MG capsule Take by mouth.   diazepam (VALIUM) 2 MG tablet Take 2 mg by mouth. As needed   diazepam (VALIUM) 5 MG tablet Take 5 mg by mouth 2 (two) times daily.   Nutritional Supplements (FEEDING SUPPLEMENT, KATE FARMS STANDARD 1.4,) LIQD liquid Take 325 mLs by mouth as directed. 3 carton over 16 hrs   OLANZapine (ZYPREXA) 5 MG tablet Take by mouth.   Omeprazole-Sodium Bicarbonate (ZEGERID) 20-1100 MG CAPS capsule Take 1 capsule by mouth daily before breakfast.   oxybutynin (DITROPAN) 5 MG/5ML syrup Take 5 mg by mouth 2 (two) times daily.   PARoxetine (PAXIL) 10 MG tablet Take 15 mg by mouth daily.   sucralfate (CARAFATE) 1 g tablet TAKE 1 TABLET BY MOUTH 4 TIMES DAILY WITH MEALS AND AT BEDTIME   Incontinence Supply Disposable (PREVAIL BREEZERS MEDIUM) MISC pkg of 16- size medium 32" to 44"  Breathable cloth-like outer fabric (can't use the plastic outer surgace  Item # [id number]/[id number]   Misc. Devices (ALL-BODY MASSAGE) MISC 1 Units/hr by Does not apply route as needed. Full body massage for Muscle spasticity due to Cerebral palsy   mupirocin ointment (BACTROBAN) 2 % Apply to area thin film twice daily if needed (Patient not taking: Reported on 03/26/2022)   NON FORMULARY Bard Leg Bag Extension tubing w/Connector 18", Sterile, latex-free  Item# [id number]   NON FORMULARY Colorplast Freedom Cath Latex  Self-Adhering Male External Catheter 76m Diameter Intermediate  Item# [id number]  NONFORMULARY OR COMPOUNDED ISuzy BouchardREF [id number]- Kangaroo Joey Pump Set with Flush Bags - 1000 mL   Ostomy Supplies (PROTECTIVE BARRIER WIPES) MISC 1-1/4" X 3"  Item ##[id number]  [DISCONTINUED] DULoxetine (CYMBALTA) 20 MG capsule Take 30 mg by mouth every morning. (Patient not taking: Reported on 03/11/2022)   [DISCONTINUED] LORazepam (ATIVAN) 1 MG tablet Take one tablet in evenings as needed  for insomnia. (Patient not taking: Reported on 03/11/2022)   [DISCONTINUED] PAXIL 20 MG tablet SMARTSIG:1 Tablet(s) By Mouth Every Evening (Patient not taking: Reported on 03/26/2022)   No facility-administered encounter medications on file as of 03/26/2022.    Allergies as of 03/26/2022 - Review Complete 03/26/2022  Allergen Reaction Noted   Ambien [zolpidem tartrate] Nausea Only 11/30/2013   Antihistamines, chlorpheniramine-type  04/14/2016   Augmentin [amoxicillin-pot clavulanate] Diarrhea 04/02/2020   Codeine Other (See Comments) 12/08/2013   Metoclopramide Other (See Comments) 05/13/2016   Baclofen Anxiety 04/01/2014   Pheniramine Rash 04/14/2016   Sulfa antibiotics Rash 01/21/2018   Sulfonamide derivatives Rash 05/21/2008    Past Medical History:  Diagnosis Date   Cerebral palsy ([place]    Dehydration 11/22/2013   Depression with anxiety 08/01/2010   Qualifier: Diagnosis of  By: Nelson-Smith CMA (AAMA), Dottie     Dyslipidemia 08/19/2017   Esophagitis 2011   [place] in [place] 08/31/2013   GERD (gastroesophageal reflux disease)    Hyperlipidemia, mild 08/25/2015   Hyperthyroidism    Incontinence of feces    Loss of weight 08/28/2014   Medicare annual wellness visit, subsequent 08/25/2015   Mildly underweight adult 03/16/2017   Palpitations    Skin lesion of right ear 03/16/2017   Thyroid disease 08/01/2010   Qualifier: Diagnosis of  By: NHarlon DittyCMA (AAMA), Dottie      Past Surgical History:  Procedure Laterality Date   baclofen trial     baslofen pump implant     ears tubes     EYE SURGERY     FLEXIBLE SIGMOIDOSCOPY N/A 09/07/2014   Procedure: FLEXIBLE SIGMOIDOSCOPY;  Surgeon: JJerene Bears MD;  [facility]  Service: Endoscopy;  Laterality: N/A;   g-tube insert  August 2006   hamstring released     to treat contractures.    HIP SURGERY     x2 , side    IR CM INJ ANY COLONIC TUBE W/FLUORO  05/28/2017   IR CM INJ ANY COLONIC TUBE W/FLUORO   07/07/2019   IR CM INJ ANY COLONIC TUBE W/FLUORO  09/18/2019   IR GASTR TUBE CONVERT GASTR-JEJ PER W/FL MOD SED  03/14/2019   IR GENERIC HISTORICAL  07/01/2016   IR GASTR TUBE CONVERT GASTR-JEJ PER W/FL MOD SED 07/01/2016 GAletta Edouard MD [facility]   IR GENERIC HISTORICAL  07/08/2016   IR PATIENT EVAL TECH 0-60 MINS 07/08/2016 GAletta Edouard MD [facility]   IR GENERIC HISTORICAL  07/14/2016   IR GJ TUBE CHANGE 07/14/2016 JSandi Mariscal MD [facility]   IR GENERIC HISTORICAL  07/21/2016   IR PATIENT EVAL TECH 0-60 MINS [facility]   IR GENERIC HISTORICAL  08/31/2016   IR REPLC DUODEN/JEJUNO TUBE PERCUT W/FLUORO 08/31/2016 MGreggory Keen MD [facility]   IR GENERIC HISTORICAL  09/03/2016   IR GJ TUBE CHANGE 09/03/2016 JSandi Mariscal MD [facility]   IR GENERIC HISTORICAL  09/10/2016   IR GASTR TUBE CONVERT GASTR-JEJ PER W/FL MOD SED 09/10/2016 [facility]  IR GENERIC HISTORICAL  09/16/2016   IR PATIENT EVAL TECH 0-60 MINS [facility]   IR GENERIC HISTORICAL  09/29/2016   IR GJ TUBE CHANGE 09/29/2016 [name], MD [facility]   IR GJ TUBE CHANGE  02/05/2017   IR GJ TUBE CHANGE  05/21/2017   IR GJ TUBE CHANGE  08/25/2017   IR GJ TUBE CHANGE  01/18/2018   IR GJ TUBE CHANGE  02/04/2018   IR GJ TUBE CHANGE  04/21/2018   IR GJ TUBE CHANGE  08/18/2018   IR GJ TUBE CHANGE  09/12/2019   IR GJ TUBE CHANGE  01/03/2020   IR GJ TUBE CHANGE  05/13/2020   IR GJ TUBE CHANGE  09/11/2020   IR GJ TUBE CHANGE  02/27/2021   IR GJ TUBE CHANGE  08/01/2021   IR GJ TUBE CHANGE  12/31/2021   IR MECH REMOV OBSTRUC MAT ANY COLON TUBE W/FLUORO  09/02/2018   IR REPLC GASTRO/COLONIC TUBE PERCUT W/FLUORO  10/17/2018   PEG PLACEMENT  10/21/2011   Procedure: PERCUTANEOUS ENDOSCOPIC GASTROSTOMY (PEG) REPLACEMENT;  Surgeon: [name], MD;  Location: [facility];  Service: Endoscopy;  Laterality: N/A;   PEG PLACEMENT N/A 06/13/2013   Procedure: PERCUTANEOUS ENDOSCOPIC GASTROSTOMY (PEG) REPLACEMENT;  Surgeon: [name], MD;  Location: [facility];  Service: Endoscopy;  Laterality: N/A;   SPINAL FUSION     spinal fusion to correct 70 degree kyphosis  11-2010   spinal fusioncorrect 106 degree kyphosis     SPINE SURGERY  ,11/20/2010, 2011   for correction of severe contracturing spinal kyphosis.    TONSILLECTOMY      Family History  Problem Relation Age of Onset   Asthma Mother    Hyperlipidemia Mother    COPD Mother    Other Mother        bronchial stasis/ABPA   Cancer Maternal Grandmother 93       breast   Hyperlipidemia Maternal Grandmother    Hypertension Maternal Grandmother    Cancer Maternal Grandfather        prostate   Heart disease Paternal Grandfather        CHF   Osteoporosis Paternal Grandmother    Arthritis Paternal Grandmother        rheumatoid    Social History   Socioeconomic History   Marital status: Single    Spouse name: Not on file   Number of children: 0   Years of education: Not on file   Highest education level: Not on file  Occupational History   Occupation: disbaled  Tobacco Use   Smoking status: Never   Smokeless tobacco: Never  Vaping Use   Vaping Use: Never used  Substance and Sexual Activity   Alcohol use: No   Drug use: No   Sexual activity: Never  Other Topics Concern   Not on file  Social History Narrative   Not on file   Social Determinants of Health   Financial Resource Strain: Not on file  Food Insecurity: Not on file  Transportation Needs: Not on file  Physical Activity: Not on file  Stress: Not on file  Social Connections: Not on file  Intimate Partner Violence: Not on file    Review of systems: Review of Systems  Constitutional: Negative for fever and chills.  HENT: Negative.   Eyes: Negative for blurred vision.  Respiratory: as per HPI  Cardiovascular: Negative for chest pain and palpitations.  Gastrointestinal: Negative for vomiting, diarrhea, blood per rectum. Genitourinary: Negative for  dysuria, urgency, frequency  and hematuria.  Musculoskeletal: Negative for myalgias, back pain and joint pain.  Skin: Negative for itching and rash.  Neurological: Negative for dizziness, tremors, focal weakness, seizures and loss of consciousness.  Endo/Heme/Allergies: Negative for environmental allergies.  Psychiatric/Behavioral: Negative for depression, suicidal ideas and hallucinations.  All other systems reviewed and are negative.  Physical Exam: Blood pressure 124/64, pulse 88, weight 92 lb (41.7 kg), SpO2 97 %. Gen:      No acute distress HEENT:  EOMI, sclera anicteric Neck:     No masses; no thyromegaly Lungs:    Clear to auscultation bilaterally; normal respiratory effort CV:         Regular rate and rhythm; no murmurs Abd:      + bowel sounds; soft, non-tender; no palpable masses, no distension Ext:    No edema; adequate peripheral perfusion Skin:      Warm and dry; no rash Neuro: alert and oriented x 3 Psych: normal mood and affect  Data Reviewed: Imaging: CT chest 01/16/15 - volume loss in the right lower lobe suggest affronted atelectasis, pleural fluid collection versus pleural fluid in the right hemothorax.  Chest x-ray 03/03/22 - new right lower lobe infiltrate. I have reviewed images personally.  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 His recent sputum cultures are growing Pseudomonas which is likely not covered with antibiotics he has received before. I will give him a round of ciprofloxacin and recheck CXR and sputum cultures.  Continue mucociliary clearance with percussion vest cough assist and mucinex  Plan/Recommendations: Cipro Hycodan cough syrup Chest x ray and sputum cultures  Marshell Garfinkel MD [facility] 03/26/2022, 2:37 PM  CC: Terrilyn Saver, NP

## 2022-03-27 ENCOUNTER — Encounter: Payer: Self-pay | Admitting: Pulmonary Disease

## 2022-03-31 ENCOUNTER — Encounter: Payer: Self-pay | Admitting: Family Medicine

## 2022-04-01 ENCOUNTER — Other Ambulatory Visit: Payer: Self-pay | Admitting: Physical Medicine & Rehabilitation

## 2022-04-01 DIAGNOSIS — G809 Cerebral palsy, unspecified: Secondary | ICD-10-CM

## 2022-04-01 DIAGNOSIS — G8250 Quadriplegia, unspecified: Secondary | ICD-10-CM

## 2022-04-01 NOTE — Telephone Encounter (Signed)
Patient's mom would like an update on her son's feeding medication. She states he is out and does not know what to do and all they are telling her is that [employer] is still waiting for forms from [place]. Please advise.

## 2022-04-08 NOTE — Progress Notes (Signed)
MyChart Video Visit    Virtual Visit via Video Note   This visit type was conducted due to national recommendations for restrictions regarding the COVID-19 Pandemic (e.g. social distancing) in an effort to limit this patient's exposure and mitigate transmission in our community. This patient is at least at moderate risk for complications without adequate follow up. This format is felt to be most appropriate for this patient at this time. Physical exam was limited by quality of the video and audio technology used for the visit. Verdell Carmine., CMA was able to get the patient set up on a video visit.  Patient location: Home Patient and provider in visit Provider location: Office  I discussed the limitations of evaluation and management by telemedicine and the availability of in person appointments. The patient expressed understanding and agreed to proceed.  Visit Date: 04/09/2022  Today's healthcare provider: [name], MD     Subjective:    Patient ID: [name], male    DOB: 09-09-1986, 36 y.o.   MRN: [id number]  Chief Complaint  Patient presents with   Cough   Insomnia    HPI Patient is in today for sleep problems, agitation and recurrent pneumonia. Has been struggling with more agitation lately and working with psychiatry.  Mom was really struggling with his poor sleep his irritability and his acting out with his aids but recently they increased his Cymbalta from 60 mg to 90 mg which she feels she noticed an almost immediate improvement.  He is sleeping better and much more settled. Is following closely with pulmonology.  Has just finished a course of ciprofloxacin and his mom reports while he was taking the antibiotic his secretions his cough all improved.  Now that he has been off a couple of days she already notes an increase in secretions and cough but not as bad as they were before.  They have collected a sputum culture and awaiting her results.  He is not experiencing  any fevers chills or significant behavior changes as a result.    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   GERD (gastroesophageal reflux disease)    Hyperlipidemia, mild 08/25/2015   Hyperthyroidism    Incontinence of feces    Loss of weight 08/28/2014   Medicare annual wellness visit, subsequent 08/25/2015   Mildly underweight adult 03/16/2017   Palpitations    Skin lesion of right ear 03/16/2017   Thyroid disease 08/01/2010   Qualifier: Diagnosis of  By: Harlon Ditty CMA (AAMA), Dottie      Past Surgical History:  Procedure Laterality Date   baclofen trial     baslofen pump implant     ears tubes     EYE SURGERY     FLEXIBLE SIGMOIDOSCOPY N/A 09/07/2014   Procedure: FLEXIBLE SIGMOIDOSCOPY;  Surgeon: [name], MD;  Location: [facility];  Service: Endoscopy;  Laterality: N/A;   g-tube insert  August 2006   hamstring released     to treat contractures.    HIP SURGERY     x2 , side    IR CM INJ ANY COLONIC TUBE W/FLUORO  05/28/2017   IR CM INJ ANY COLONIC TUBE W/FLUORO  07/07/2019   IR CM INJ ANY COLONIC TUBE W/FLUORO  09/18/2019   IR GASTR TUBE CONVERT GASTR-JEJ PER W/FL MOD SED  03/14/2019   IR GENERIC HISTORICAL  07/01/2016   IR GASTR TUBE CONVERT GASTR-JEJ PER W/FL MOD SED 07/01/2016 [name], MD [facility]   IR GENERIC HISTORICAL  07/08/2016   IR PATIENT EVAL TECH 0-60 MINS 07/08/2016 [name], MD [facility]   IR GENERIC HISTORICAL  07/14/2016   IR GJ TUBE CHANGE 07/14/2016 [name], MD [facility]   IR GENERIC HISTORICAL  07/21/2016   IR PATIENT EVAL TECH 0-60 MINS [facility]   IR GENERIC HISTORICAL  08/31/2016   IR REPLC DUODEN/JEJUNO TUBE PERCUT W/FLUORO 08/31/2016 [name], MD [facility]   IR GENERIC  HISTORICAL  09/03/2016   IR GJ TUBE CHANGE 09/03/2016 [name], MD [facility]   IR GENERIC HISTORICAL  09/10/2016   IR GASTR TUBE CONVERT GASTR-JEJ PER W/FL MOD SED 09/10/2016 [facility]   IR GENERIC HISTORICAL  09/16/2016   IR PATIENT EVAL TECH 0-60 MINS [facility]   IR GENERIC HISTORICAL  09/29/2016   IR GJ TUBE CHANGE 09/29/2016 [name], MD [facility]   IR GJ TUBE CHANGE  02/05/2017   IR GJ TUBE CHANGE  05/21/2017   IR GJ TUBE CHANGE  08/25/2017   IR GJ TUBE CHANGE  01/18/2018   IR GJ TUBE CHANGE  02/04/2018   IR GJ TUBE CHANGE  04/21/2018   IR GJ TUBE CHANGE  08/18/2018   IR GJ TUBE CHANGE  09/12/2019   IR GJ TUBE CHANGE  01/03/2020   IR GJ TUBE CHANGE  05/13/2020   IR GJ TUBE CHANGE  09/11/2020   IR GJ TUBE CHANGE  02/27/2021   IR GJ TUBE CHANGE  08/01/2021   IR GJ TUBE CHANGE  12/31/2021   IR MECH REMOV OBSTRUC MAT ANY COLON TUBE W/FLUORO  09/02/2018   IR [place] GASTRO/COLONIC TUBE PERCUT W/FLUORO  10/17/2018   PEG PLACEMENT  10/21/2011   Procedure: PERCUTANEOUS ENDOSCOPIC GASTROSTOMY (PEG) REPLACEMENT;  Surgeon: [name], MD;  Location: [facility];  Service: Endoscopy;  Laterality: N/A;   PEG PLACEMENT N/A 06/13/2013   Procedure: PERCUTANEOUS ENDOSCOPIC GASTROSTOMY (PEG) REPLACEMENT;  Surgeon: [name], MD;  Location: [facility];  Service: Endoscopy;  Laterality: N/A;   SPINAL FUSION     spinal fusion to correct 70 degree kyphosis  11-2010   spinal fusioncorrect 106 degree kyphosis     SPINE SURGERY  ,11/20/2010, 2011   for correction of severe contracturing spinal kyphosis.    TONSILLECTOMY      Family History  Problem Relation Age of Onset   Asthma Mother    Hyperlipidemia Mother    COPD Mother    Other Mother        bronchial stasis/ABPA   Cancer Maternal Grandmother 69       breast   Hyperlipidemia Maternal Grandmother    Hypertension Maternal Grandmother    Cancer Maternal Grandfather        prostate   Heart disease Paternal Grandfather         CHF   Osteoporosis Paternal Grandmother    Arthritis Paternal Grandmother        rheumatoid    Social History   Socioeconomic History   Marital status: Single    Spouse name: Not on file   Number of children: 0   Years of education: Not on file   Highest education level: Not on file  Occupational History   Occupation: disbaled  Tobacco  Use   Smoking status: Never   Smokeless tobacco: Never  Vaping Use   Vaping Use: Never used  Substance and Sexual Activity   Alcohol use: No   Drug use: No   Sexual activity: Never  Other Topics Concern   Not on file  Social History Narrative   Not on file   Social Determinants of Health   Financial Resource Strain: Not on file  Food Insecurity: Not on file  Transportation Needs: Not on file  Physical Activity: Not on file  Stress: Not on file  Social Connections: Not on file  Intimate Partner Violence: Not on file    Outpatient Medications Prior to Visit  Medication Sig Dispense Refill   albuterol (PROVENTIL) (2.5 MG/3ML) 0.083% nebulizer solution Take 3 mLs (2.5 mg total) by nebulization every 6 (six) hours as needed for shortness of breath. 75 mL 5   AMBULATORY NON FORMULARY MEDICATION Medication Name: MIC gastrostomy/bolus feeding tube 24 French Part number 0110-24. #2 and 10 cc lurer lock syringe #2 Dx: 4 Device 2   ciprofloxacin (CIPRO) 750 MG tablet Take 1 tablet (750 mg total) by mouth 2 (two) times daily. 14 tablet 0   CYMBALTA 30 MG capsule Take 30 mg by mouth every morning.     CYMBALTA 60 MG capsule Take 60 mg by mouth every morning.     dantrolene (DANTRIUM) 50 MG capsule TAKE 1 CAPSULE BY MOUTH EVERY MORNING, 1 CAPSULE EVERY EVENING, AND 1 CAPSULE AT BEDTIME. 90 capsule 1   DEPAKOTE SPRINKLES 125 MG capsule Take by mouth.     diazepam (VALIUM) 2 MG tablet Take 2 mg by mouth. As needed     diazepam (VALIUM) 5 MG tablet Take 5 mg by mouth 2 (two) times daily.     HYDROcodone bit-homatropine (HYCODAN) 5-1.5 MG/5ML syrup  Take 5 mLs by mouth every 6 (six) hours as needed for cough. 240 mL 0   Incontinence Supply Disposable (PREVAIL BREEZERS MEDIUM) MISC pkg of 16- size medium 32" to 44"  Breathable cloth-like outer fabric (can't use the plastic outer surgace  Item # [id number]/[id number] 16 each 6   Misc. Devices (ALL-BODY MASSAGE) MISC 1 Units/hr by Does not apply route as needed. Full body massage for Muscle spasticity due to Cerebral palsy 99 each 99   mupirocin ointment (BACTROBAN) 2 % Apply to area thin film twice daily if needed 22 g 0   NON FORMULARY Bard Leg Bag Extension tubing w/Connector 18", Sterile, latex-free  Item# [id number]     NON FORMULARY Colorplast Freedom Cath Latex Self-Adhering Male External Catheter 58m Diameter Intermediate  Item# [id number]    NONFORMULARY OR COMPOUNDED ITEM Cov[employer]ien REF [id number]- Kangaroo Joey Pump Set with Flush Bags - 1000 mL 1 each 0   Nutritional Supplements (FEEDING SUPPLEMENT, KATE FARMS STANDARD 1.4,) LIQD liquid Take 325 mLs by mouth as directed. 3 carton over 16 hrs     OLANZapine (ZYPREXA) 5 MG tablet Take by mouth.     Omeprazole-Sodium Bicarbonate (ZEGERID) 20-1100 MG CAPS capsule Take 1 capsule by mouth daily before breakfast.     Ostomy Supplies (PROTECTIVE BARRIER WIPES) MISC 1-1/4" X 3"  Item ##[id number] each 6   oxybutynin (DITROPAN) 5 MG/5ML syrup Take 5 mg by mouth 2 (two) times daily.     PARoxetine (PAXIL) 10 MG tablet Take 15 mg by mouth daily.     sucralfate (CARAFATE) 1 g tablet TAKE 1 TABLET BY MOUTH 4 TIMES DAILY WITH MEALS AND AT BEDTIME  120 tablet 1   No facility-administered medications prior to visit.    Allergies  Allergen Reactions   Ambien [Zolpidem Tartrate] Nausea Only   Antihistamines, Chlorpheniramine-Type     Other reaction(s): Other (See Comments) Other Reaction: agitation   Augmentin [Amoxicillin-Pot Clavulanate] Diarrhea   Codeine Other (See Comments)    Makes patient too active after a few days.   Metoclopramide Other (See  Comments)    Delusion, emotionality    Baclofen Anxiety    anxiety   Pheniramine Rash    Other reaction(s): Other (See Comments) Other Reaction: agitation   Sulfa Antibiotics Rash   Sulfonamide Derivatives Rash    Review of Systems  Constitutional:  Negative for fever and malaise/fatigue.  HENT:  Positive for congestion.   Eyes:  Negative for blurred vision.  Respiratory:  Positive for cough and sputum production. Negative for shortness of breath.   Cardiovascular:  Negative for chest pain, palpitations and leg swelling.  Gastrointestinal:  Negative for abdominal pain, blood in stool and nausea.  Genitourinary:  Negative for dysuria and frequency.  Musculoskeletal:  Negative for falls.  Skin:  Negative for rash.  Neurological:  Negative for dizziness, loss of consciousness and headaches.  Endo/Heme/Allergies:  Negative for environmental allergies.  Psychiatric/Behavioral:  Negative for depression. The patient is nervous/anxious and has insomnia.        Objective:    Physical Exam Constitutional:      Comments: slim  Neurological:     Mental Status: He is alert.     Coordination: Coordination abnormal.     Comments: Spastic paralysis     BP 117/87   Pulse (!) 104   Temp 98 F (36.7 C)   SpO2 96%  Wt Readings from Last 3 Encounters:  03/26/22 92 lb (41.7 kg)  02/05/22 95 lb (43.1 kg)  03/26/21 92 lb (41.7 kg)    Diabetic Foot Exam - Simple   No data filed    Lab Results  Component Value Date   WBC 7.3 08/07/2021   HGB 15.5 08/07/2021   HCT 45.7 08/07/2021   PLT 179.0 Repeated and verified X2. 08/07/2021   GLUCOSE 75 08/07/2021   CHOL 129 08/07/2021   TRIG 80.0 08/07/2021   HDL 37.40 (L) 08/07/2021   LDLCALC 76 08/07/2021   ALT 24 08/07/2021   AST 24 08/07/2021   NA 140 08/07/2021   K 4.1 08/07/2021   CL 101 08/07/2021   CREATININE 0.30 (L) 08/07/2021   BUN 8 08/07/2021   CO2 28 08/07/2021   TSH 1.81 08/07/2021   HGBA1C 5.0 04/29/2007    Lab  Results  Component Value Date   TSH 1.81 08/07/2021   Lab Results  Component Value Date   WBC 7.3 08/07/2021   HGB 15.5 08/07/2021   HCT 45.7 08/07/2021   MCV 94.8 08/07/2021   PLT 179.0 Repeated and verified X2. 08/07/2021   Lab Results  Component Value Date   NA 140 08/07/2021   K 4.1 08/07/2021   CO2 28 08/07/2021   GLUCOSE 75 08/07/2021   BUN 8 08/07/2021   CREATININE 0.30 (L) 08/07/2021   BILITOT 0.5 08/07/2021   ALKPHOS 73 08/07/2021   AST 24 08/07/2021   ALT 24 08/07/2021   PROT 7.4 08/07/2021   ALBUMIN 4.6 08/07/2021   CALCIUM 9.4 08/07/2021   ANIONGAP 11 09/18/2014   GFR 152.15 08/07/2021   Lab Results  Component Value Date   CHOL 129 08/07/2021   Lab Results  Component Value Date  HDL 37.40 (L) 08/07/2021   Lab Results  Component Value Date   LDLCALC 76 08/07/2021   Lab Results  Component Value Date   TRIG 80.0 08/07/2021   Lab Results  Component Value Date   CHOLHDL 3 08/07/2021   Lab Results  Component Value Date   HGBA1C 5.0 04/29/2007       Assessment & Plan:   Problem List Items Addressed This Visit     Depression with anxiety    Has been struggling with more agitation lately and working with psychiatry.  Mom was really struggling with his poor sleep his irritability and his acting out with his aids but recently they increased his Cymbalta from 60 mg to 90 mg which she feels she noticed an almost immediate improvement.  He is sleeping better and much more settled.  No further changes unless symptoms worsen at this time.      Gastrostomy in place [facility])    Continues to tolerate tube feeds and maintain NPO status      Protein-calorie malnutrition, severe ([place])    Continues NPO and tolerates G tube feeds.       Recurrent pneumonia    Is following closely with pulmonology.  Has just finished a course of ciprofloxacin and his mom reports while he was taking the antibiotic his secretions his cough all improved.  Now that he has been  off a couple of days she already notes an increase in secretions and cough but not as bad as they were before.  They have collected a sputum culture and awaiting her results.  He is not experiencing any fevers chills or significant behavior changes as a result.  They are given a course of Levaquin 500 mg daily x7 days to utilize only if symptoms flare significantly and they will follow-up with pulmonology.      Relevant Medications   levofloxacin (LEVAQUIN) 500 MG tablet    I am having Chad S. Hewlett "[name]" start on levofloxacin. I am also having him maintain his Omeprazole-Sodium Bicarbonate, NON FORMULARY, NON FORMULARY, Protective Barrier Wipes, Prevail Breezers Medium, NONFORMULARY OR COMPOUNDED ITEM, PARoxetine, AMBULATORY NON FORMULARY MEDICATION, mupirocin ointment, All-Body Massage, feeding supplement (KATE FARMS STANDARD 1.4), OLANZapine, oxybutynin, Depakote Sprinkles, sucralfate, diazepam, diazepam, albuterol, Cymbalta, Cymbalta, HYDROcodone bit-homatropine, ciprofloxacin, and dantrolene.  Meds ordered this encounter  Medications   levofloxacin (LEVAQUIN) 500 MG tablet    Sig: Take 1 tablet (500 mg total) by mouth daily for 7 days.    Dispense:  7 tablet    Refill:  0    Only fill if patient requests    I discussed the assessment and treatment plan with the patient. The patient was provided an opportunity to ask questions and all were answered. The patient agreed with the plan and demonstrated an understanding of the instructions.   The patient was advised to call back or seek an in-person evaluation if the symptoms worsen or if the condition fails to improve as anticipated.   [name], MD [facility] at [facility] [phone] (phone) [phone] (fax)  [place]

## 2022-04-09 ENCOUNTER — Telehealth (INDEPENDENT_AMBULATORY_CARE_PROVIDER_SITE_OTHER): Payer: Medicare Other | Admitting: Family Medicine

## 2022-04-09 ENCOUNTER — Other Ambulatory Visit: Payer: Medicare Other

## 2022-04-09 ENCOUNTER — Encounter: Payer: Self-pay | Admitting: Family Medicine

## 2022-04-09 DIAGNOSIS — E43 Unspecified severe protein-calorie malnutrition: Secondary | ICD-10-CM | POA: Diagnosis not present

## 2022-04-09 DIAGNOSIS — R059 Cough, unspecified: Secondary | ICD-10-CM

## 2022-04-09 DIAGNOSIS — Z931 Gastrostomy status: Secondary | ICD-10-CM | POA: Diagnosis not present

## 2022-04-09 DIAGNOSIS — F418 Other specified anxiety disorders: Secondary | ICD-10-CM | POA: Diagnosis not present

## 2022-04-09 DIAGNOSIS — J189 Pneumonia, unspecified organism: Secondary | ICD-10-CM | POA: Diagnosis not present

## 2022-04-09 MED ORDER — LEVOFLOXACIN 500 MG PO TABS: 500.0000 mg | ORAL_TABLET | Freq: Every day | ORAL | 0 refills | Status: AC

## 2022-04-09 NOTE — Assessment & Plan Note (Signed)
Continues NPO and tolerates G tube feeds.

## 2022-04-09 NOTE — Assessment & Plan Note (Addendum)
Has been struggling with more agitation lately and working with psychiatry.  Mom was really struggling with his poor sleep his irritability and his acting out with his aids but recently they increased his Cymbalta from 60 mg to 90 mg which she feels she noticed an almost immediate improvement.  He is sleeping better and much more settled.  No further changes unless symptoms worsen at this time.

## 2022-04-09 NOTE — Assessment & Plan Note (Signed)
Continues to tolerate tube feeds and maintain NPO status

## 2022-04-09 NOTE — Assessment & Plan Note (Signed)
Is following closely with pulmonology.  Has just finished a course of ciprofloxacin and his mom reports while he was taking the antibiotic his secretions his cough all improved.  Now that he has been off a couple of days she already notes an increase in secretions and cough but not as bad as they were before.  They have collected a sputum culture and awaiting her results.  He is not experiencing any fevers chills or significant behavior changes as a result.  They are given a course of Levaquin 500 mg daily x7 days to utilize only if symptoms flare significantly and they will follow-up with pulmonology.

## 2022-04-10 ENCOUNTER — Encounter: Payer: Self-pay | Admitting: Pulmonary Disease

## 2022-04-10 DIAGNOSIS — J189 Pneumonia, unspecified organism: Secondary | ICD-10-CM

## 2022-04-10 DIAGNOSIS — R059 Cough, unspecified: Secondary | ICD-10-CM

## 2022-04-10 NOTE — Telephone Encounter (Signed)
Received the following message from patient's mother:   "Just to let you know.....by the 6th & 7th days of the Ciprofloxin- June 1, [name] was sounding so much better and he was hardly coughing.   He had 4/5 days of really good.  And this Monday/Tuesday the coughing increased a lot, with a lot of suctioning and the return of thick yellow secretions!  He is noisy and when [facility] nurse listened to him right lung is crackly and left lung clear. So I dropped off a culture in a specimen container, since you put that order in at his visit.     Thank you, Marya Fossa"

## 2022-04-12 LAB — RESPIRATORY CULTURE OR RESPIRATORY AND SPUTUM CULTURE
MICRO NUMBER:: 13500775
SPECIMEN QUALITY:: ADEQUATE

## 2022-04-14 ENCOUNTER — Telehealth: Payer: Self-pay | Admitting: Pulmonary Disease

## 2022-04-14 MED ORDER — SODIUM CHLORIDE 3 % IN NEBU: INHALATION_SOLUTION | Freq: Two times a day (BID) | RESPIRATORY_TRACT | 12 refills | Status: DC

## 2022-04-14 NOTE — Telephone Encounter (Signed)
Called pt's pharmacy and provided a verbal of the amoxicillin abx and called pt's mother letting her know the info and that we also sent order for neb machine and sol for pt. Nothing further needed.

## 2022-04-14 NOTE — Telephone Encounter (Signed)
Please order Amoxicillin (liquid) 875 mg every 12 hours for 7 days

## 2022-04-14 NOTE — Telephone Encounter (Signed)
I called and spoke with Mom Please call in ampicillin (liquid formation preferred). 250 mg 4 times daily for 7 days Also call in a nebulizer machine and saline nebs bid for mucociliary clearance  Ok to cancel appointment with app tomorrow as I have spoke with mom

## 2022-04-14 NOTE — Telephone Encounter (Signed)
Order for neb machine and sol have been placed for pt.   Called pt's pharmacy to see if they had liquid ampicillin in stock and was told that they did not carry ampicillin in liquid form. With the capsules, they had '500mg'$  in stock but they said they could order the '250mg'$  capsules.  Asked what liquid abx that they had in stock and was told that they had azithromycin, amoxicillin, cefdinir, cephalexin, and augmentin. Pt has allergy/intolerance to augmentin.   Routing back to Dr. Vaughan Browner for review.

## 2022-04-14 NOTE — Telephone Encounter (Signed)
Received the following message from patient's mother:   "I saw culture results were back yesterday, so I thought I'd hear back from him today? latest update - still bringing up thick yellowy mucous mostly in the mostly mornings, and still coughs a lot and we suction a lot.  Had to give cough medicine Sunday evening because coughing so persistent.   Thank you, Marya Fossa"  Dr. Vaughan Browner, can you please advise about the culture results? Thanks!

## 2022-04-14 NOTE — Telephone Encounter (Signed)
See mychart message. Closing duplicate message.

## 2022-04-15 ENCOUNTER — Ambulatory Visit: Payer: Medicare Other | Admitting: Nurse Practitioner

## 2022-04-27 DIAGNOSIS — F339 Major depressive disorder, recurrent, unspecified: Secondary | ICD-10-CM | POA: Diagnosis not present

## 2022-05-07 ENCOUNTER — Other Ambulatory Visit (HOSPITAL_COMMUNITY): Payer: Self-pay | Admitting: Radiology

## 2022-05-07 DIAGNOSIS — R633 Feeding difficulties, unspecified: Secondary | ICD-10-CM

## 2022-05-11 ENCOUNTER — Encounter: Payer: Self-pay | Admitting: Family Medicine

## 2022-05-11 ENCOUNTER — Ambulatory Visit (HOSPITAL_COMMUNITY)
Admission: RE | Admit: 2022-05-11 | Discharge: 2022-05-11 | Disposition: A | Discharge status: Home / Self Care | Payer: Medicare Other | Source: Ambulatory Visit | Attending: Radiology | Admitting: Radiology

## 2022-05-11 DIAGNOSIS — R633 Feeding difficulties, unspecified: Secondary | ICD-10-CM | POA: Diagnosis not present

## 2022-05-11 DIAGNOSIS — K9413 Enterostomy malfunction: Secondary | ICD-10-CM | POA: Diagnosis not present

## 2022-05-11 DIAGNOSIS — Z431 Encounter for attention to gastrostomy: Secondary | ICD-10-CM | POA: Diagnosis not present

## 2022-05-11 HISTORY — PX: IR GJ TUBE CHANGE: IMG1440

## 2022-05-11 MED ORDER — IOHEXOL 300 MG/ML  SOLN
50.0000 mL | Freq: Once | INTRAMUSCULAR | Status: AC | PRN
  Administered 2022-05-11: 10 mL

## 2022-05-11 NOTE — Telephone Encounter (Signed)
Pt's mom spoke with Laila, and wanted to see if pcp could recommend anything to help.

## 2022-05-14 NOTE — Telephone Encounter (Signed)
Called pt's mom. No answer but left VM asking for return call regarding pt's status.

## 2022-05-27 DIAGNOSIS — F339 Major depressive disorder, recurrent, unspecified: Secondary | ICD-10-CM | POA: Diagnosis not present

## 2022-05-28 ENCOUNTER — Other Ambulatory Visit: Payer: Self-pay | Admitting: Physical Medicine & Rehabilitation

## 2022-05-28 ENCOUNTER — Telehealth: Payer: Self-pay | Admitting: Pulmonary Disease

## 2022-05-28 DIAGNOSIS — G809 Cerebral palsy, unspecified: Secondary | ICD-10-CM

## 2022-05-28 DIAGNOSIS — G8250 Quadriplegia, unspecified: Secondary | ICD-10-CM

## 2022-05-28 NOTE — Telephone Encounter (Signed)
Called mother and she states that patient is having cough with yellow mucus production. Only occurs in the morning. Suction with trach being done. Air quality is making it worse. Mother states that he is taking mucinex to help but he is still staying pretty congested. Mother states that his mouth is really dry and she is doing extra hydration but nothing is helping. Mother states that this has been occurring for 6 weeks now. Mother states that his blood pressure is getting low sometimes too. No other symptoms noted from mother.   Please advise sir

## 2022-05-29 ENCOUNTER — Encounter: Payer: Self-pay | Admitting: Pulmonary Disease

## 2022-05-29 NOTE — Telephone Encounter (Signed)
Can we get sputum cultures and a chest x-ray please.  We would want to avoid antibiotics as he has received so many rounds of therapy in the recent past Try over-the-counter Mucinex, Delsym, and may need extra sessions of percussion therapy clear secretion  He may need to be seen in clinic if his blood pressure is getting low or go to the emergency room

## 2022-05-29 NOTE — Telephone Encounter (Signed)
Called patient's mother but she did not answer. Left message for her to call back.

## 2022-05-29 NOTE — Telephone Encounter (Signed)
Dr. Vaughan Browner please address My Chart message and encounter from 05/28/22. Thank you

## 2022-06-02 NOTE — Telephone Encounter (Signed)
I addressed the call on the 28th.  Please see separate thread of patient call. Mom was called on 28th by Cherina but there was no answer and message was left for call back  My response was Can we get sputum cultures and a chest x-ray please.  We would want to avoid antibiotics as he has received so many rounds of therapy in the recent past Try over-the-counter Mucinex, Delsym, and may need extra sessions of percussion therapy clear secretion   He may need to be seen in clinic if his blood pressure is getting low or go to the emergency room

## 2022-06-03 ENCOUNTER — Other Ambulatory Visit: Payer: Medicare Other

## 2022-06-03 ENCOUNTER — Ambulatory Visit (HOSPITAL_BASED_OUTPATIENT_CLINIC_OR_DEPARTMENT_OTHER)
Admission: RE | Admit: 2022-06-03 | Discharge: 2022-06-03 | Disposition: A | Discharge status: Home / Self Care | Payer: Medicare Other | Source: Ambulatory Visit | Attending: Family Medicine | Admitting: Family Medicine

## 2022-06-03 ENCOUNTER — Encounter: Payer: Self-pay | Admitting: Family Medicine

## 2022-06-03 ENCOUNTER — Telehealth (INDEPENDENT_AMBULATORY_CARE_PROVIDER_SITE_OTHER): Payer: Medicare Other | Admitting: Family Medicine

## 2022-06-03 ENCOUNTER — Encounter (HOSPITAL_BASED_OUTPATIENT_CLINIC_OR_DEPARTMENT_OTHER): Payer: Self-pay

## 2022-06-03 DIAGNOSIS — R062 Wheezing: Secondary | ICD-10-CM | POA: Insufficient documentation

## 2022-06-03 DIAGNOSIS — R058 Other specified cough: Secondary | ICD-10-CM | POA: Insufficient documentation

## 2022-06-03 DIAGNOSIS — R059 Cough, unspecified: Secondary | ICD-10-CM | POA: Diagnosis not present

## 2022-06-03 MED ORDER — PREDNISONE 20 MG PO TABS: 20.0000 mg | ORAL_TABLET | Freq: Every day | ORAL | 0 refills | Status: AC

## 2022-06-03 NOTE — Addendum Note (Signed)
Addended by: Manuela Schwartz on: 06/03/2022 02:33 PM   Modules accepted: Orders

## 2022-06-03 NOTE — Progress Notes (Signed)
Virtual Video Visit via MyChart Note  I connected with  [name] on 06/03/22 at  1:00 PM EDT by the video enabled telemedicine application for MyChart, and verified that I am speaking with the correct person using two identifiers.   I introduced myself as a Designer, jewellery with the practice. We discussed the limitations of evaluation and management by telemedicine and the availability of in person appointments. The patient expressed understanding and agreed to proceed.  Participating parties in this visit include: The patient and the nurse practitioner listed. Mom present for visit as well. The patient is: At home I am: In the office - [facility]  Subjective:    CC: respiratory concerns   HPI: GAETAN SPIEKER is a 36 y.o. year old male presenting today via [place] today for respiratory concerns.  Mom reports [name] has been having some increasing respiratory symptoms for the past week or so. She tried to get in contact with pulmonology, but by the time they responded, he was doing much better. However, last night he had a very rough night and mom was close to bringing him to the ED. Reports he had some labored breathing, wheezing, purulent sputum (worse in the mornings). O2 sat got as low as 88% at one point last night, but otherwise has been staying >95% and he has only had temps as high 100.43F. He has been slightly more tired/quiet the past day or two, but otherwise acting normal. He has been doing chest PT and incentive spirometry. Nurse was over today while video visit was going on and reported that his lungs had been sounding pretty good lately, but were mildly diminished today in the upper airways today. Reports BP has been stable.       Past medical history, Surgical history, Family history not pertinant except as noted below, Social history, Allergies, and medications have been entered into the medical record, reviewed, and corrections made.    Review of Systems:  All review of systems negative except what is listed in the HPI   Objective:    General:  Speaking clearly in complete sentences. Absent shortness of breath noted.   Alert and oriented x3.   Normal judgment.  Absent acute distress.   Impression and Recommendations:    1. Wheezing 2. Cough productive of purulent sputum  After reviewing telephone notes with pulmonologist - ordering chest xray and sputum culture. If abnormal, will forward to him for further review. Mom is very concerned about the rough night with wheezing, purulent sputum last night. Holding off on ABX for now given he has had several recently, but will give steroid burst. Continue nebs, mucinex, hydrate, chest PT, incentive spirometry, etc. Patient aware of signs/symptoms requiring further/urgent evaluation.   - Respiratory or Resp and Sputum Culture - DG Chest 2 View; Future - predniSONE (DELTASONE) 20 MG tablet; Take 1 tablet (20 mg total) by mouth daily with breakfast for 5 days.  Dispense: 5 tablet; Refill: 0   Follow-up if symptoms worsen or fail to improve.    I discussed the assessment and treatment plan with the patient. The patient was provided an opportunity to ask questions and all were answered. The patient agreed with the plan and demonstrated an understanding of the instructions.   The patient was advised to call back or seek an in-person evaluation if the symptoms worsen or if the condition fails to improve as anticipated.    Terrilyn Saver, NP

## 2022-06-04 LAB — RESPIRATORY CULTURE OR RESPIRATORY AND SPUTUM CULTURE: MICRO NUMBER:: 13726478

## 2022-06-05 ENCOUNTER — Telehealth: Payer: Self-pay | Admitting: Family Medicine

## 2022-06-05 NOTE — Telephone Encounter (Signed)
[name] states that when she came in to give the sputum specimen with her son, they were concerned that it wasn't going to be adequate so they got sent home with two containers. On the way out of the lab before reaching their car, her son got a coughing spell and was able to fill the container they gave her so she ran it back to the lab. Pt's mom would like to know which specimen was submitted to testing, the first one or the second one she ran back to the lab. Please advise.

## 2022-06-05 NOTE — Telephone Encounter (Signed)
Reviewed result note: "specimen is unacceptable for culture due to oropharyngeal contamination".   Spoke with Lind Guest at [facility], advised her that we submitted both specimens from pt on 8/2 and we would like to know which specimen was tested. She is sending question to the micro department and someone will call me back to clarify.

## 2022-06-05 NOTE — Telephone Encounter (Signed)
Please see pt email from 7/28

## 2022-06-05 NOTE — Telephone Encounter (Signed)
Spoke with [name]  at [facility] [place] returning call to [name] in reference to recent call for up date. [name] informed me that she has spoke to someone in the lab and they are currently working on the issue with test question and will contact office with additional information once they find out which specimen was used for testing and will possible need a new order. [name] will be leaving in about 30 minutes and will make sure they update [name] with this information by the end of the day.

## 2022-06-08 LAB — RESPIRATORY CULTURE OR RESPIRATORY AND SPUTUM CULTURE: MICRO NUMBER:: 13737318

## 2022-06-09 NOTE — Telephone Encounter (Signed)
It looks like results have been posted on both specimens submitted that day and both say their is oropharyngeal contamination. I confirmed this with Gelon, CSR at [facility]. She is going to send an email to micro to get further collection tips / information for pt's mother and will call us back with that information.

## 2022-06-10 NOTE — Telephone Encounter (Signed)
Spoke with [facility].  They stated that both specimens was contamination because of too much saliva on the sputum, which made it not a good collection.   Advised Lovena Le about this and she stated that if patient is doing better we do not need to recollect and if the mother would like to recollect we can.  If they would like to recollect to make sure they collect first thing in the morning or use the chest vest to help get up a good specimen.    Spoke with mother and she stated that he was doing better.  He is taking the '20mg'$  of prednisone and is experiencing a little joint pain but he will continue to take.  She will call us back if she would like it recollected, but at this time she will just see how he does.

## 2022-06-11 ENCOUNTER — Telehealth: Payer: Self-pay | Admitting: Family Medicine

## 2022-06-11 ENCOUNTER — Ambulatory Visit (INDEPENDENT_AMBULATORY_CARE_PROVIDER_SITE_OTHER): Payer: Medicare Other | Admitting: Family

## 2022-06-11 ENCOUNTER — Ambulatory Visit: Payer: Medicare Other | Admitting: Family

## 2022-06-11 ENCOUNTER — Encounter: Payer: Self-pay | Admitting: Family

## 2022-06-11 ENCOUNTER — Ambulatory Visit (HOSPITAL_COMMUNITY)
Admission: RE | Admit: 2022-06-11 | Discharge: 2022-06-11 | Disposition: A | Discharge status: Home / Self Care | Payer: Medicare Other | Source: Ambulatory Visit | Attending: Family | Admitting: Family

## 2022-06-11 ENCOUNTER — Telehealth: Payer: Self-pay | Admitting: Family

## 2022-06-11 VITALS — BP 125/92 | HR 109 | Temp 97.2°F | Resp 18

## 2022-06-11 DIAGNOSIS — H1031 Unspecified acute conjunctivitis, right eye: Secondary | ICD-10-CM | POA: Insufficient documentation

## 2022-06-11 DIAGNOSIS — Z5181 Encounter for therapeutic drug level monitoring: Secondary | ICD-10-CM | POA: Insufficient documentation

## 2022-06-11 DIAGNOSIS — M79606 Pain in leg, unspecified: Secondary | ICD-10-CM | POA: Insufficient documentation

## 2022-06-11 DIAGNOSIS — H1032 Unspecified acute conjunctivitis, left eye: Secondary | ICD-10-CM | POA: Diagnosis not present

## 2022-06-11 DIAGNOSIS — M25561 Pain in right knee: Secondary | ICD-10-CM | POA: Insufficient documentation

## 2022-06-11 DIAGNOSIS — M79604 Pain in right leg: Secondary | ICD-10-CM

## 2022-06-11 LAB — COMPREHENSIVE METABOLIC PANEL
ALT: 16 U/L (ref 0–53)
AST: 13 U/L (ref 0–37)
Albumin: 4.5 g/dL (ref 3.5–5.2)
Alkaline Phosphatase: 73 U/L (ref 39–117)
BUN: 7 mg/dL (ref 6–23)
CO2: 28 mEq/L (ref 19–32)
Calcium: 9.1 mg/dL (ref 8.4–10.5)
Chloride: 98 mEq/L (ref 96–112)
Creatinine, Ser: 0.28 mg/dL — ABNORMAL LOW (ref 0.40–1.50)
GFR: 154.44 mL/min (ref >60.00)
Glucose, Bld: 84 mg/dL (ref 70–99)
Potassium: 4.2 mEq/L (ref 3.5–5.1)
Sodium: 136 mEq/L (ref 135–145)
Total Bilirubin: 0.4 mg/dL (ref 0.2–1.2)
Total Protein: 7.2 g/dL (ref 6.0–8.3)

## 2022-06-11 LAB — MAGNESIUM: Magnesium: 2.3 mg/dL (ref 1.5–2.5)

## 2022-06-11 LAB — CK: Total CK: 13 U/L (ref 7–232)

## 2022-06-11 MED ORDER — CIPROFLOXACIN HCL 0.3 % OP SOLN: 2.0000 [drp] | OPHTHALMIC | 0 refills | Status: DC

## 2022-06-11 NOTE — Progress Notes (Signed)
Subjective:     Patient ID: [name], male    DOB: [birth year], 36 y.o.   MRN: [id number]  Chief Complaint  Patient presents with   Conjunctivitis   Leg Pain    Complains of pain on both legs    HPI Patient is in today with mom and aid.  Mom noted redness and yellow d/c from left eye.    Leg pain- notes that he is has 3 weeks of leg pain.  Not sleeping well as a result. Mom and aid have been doing lots of massage and ROM exercises without improvement.   He is following with neuropsychiatry who recently increased his cymbalta dose due to increased anxiety. Mom thinks leg pain may be contributing.    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[place]) 08/31/2013   GERD (gastroesophageal reflux disease)    Hyperlipidemia, mild 08/25/2015   Hyperthyroidism    Incontinence of feces    Loss of weight 08/28/2014   Medicare annual wellness visit, subsequent 08/25/2015   Mildly underweight adult 03/16/2017   Palpitations    PALSY, INFANTILE CEREBRAL, 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   Qualifier: Diagnosis of  By: Jerold Coombe  Working with PMR Dr Tessa Lerner and tolerating Botox injections in hips, shoulders, etc with good results   Skin lesion of right ear 03/16/2017   Thyroid disease 08/01/2010   Qualifier: Diagnosis of  By: Harlon Ditty CMA (AAMA), Dottie      Past Surgical History:  Procedure Laterality Date   baclofen trial     baslofen pump implant     ears tubes     EYE SURGERY     FLEXIBLE SIGMOIDOSCOPY N/A 09/07/2014   Procedure: FLEXIBLE  SIGMOIDOSCOPY;  Surgeon: [name], MD;  Location: [facility];  Service: Endoscopy;  Laterality: N/A;   g-tube insert  August 2006   hamstring released     to treat contractures.    HIP SURGERY     x2 , side    IR CM INJ ANY COLONIC TUBE W/FLUORO  05/28/2017   IR CM INJ ANY COLONIC TUBE W/FLUORO  07/07/2019   IR CM INJ ANY COLONIC TUBE W/FLUORO  09/18/2019   IR GASTR TUBE CONVERT GASTR-JEJ PER W/FL MOD SED  03/14/2019   IR GENERIC HISTORICAL  07/01/2016   IR GASTR TUBE CONVERT GASTR-JEJ PER W/FL MOD SED 07/01/2016 [name], MD [facility]   IR GENERIC HISTORICAL  07/08/2016   IR PATIENT EVAL TECH 0-60 MINS 07/08/2016 [name], MD [facility]   IR GENERIC HISTORICAL  07/14/2016   IR GJ TUBE CHANGE 07/14/2016 [name], MD [facility]   IR GENERIC HISTORICAL  07/21/2016   IR PATIENT EVAL TECH 0-60 MINS [facility]   IR GENERIC HISTORICAL  08/31/2016   IR REPLC DUODEN/JEJUNO TUBE PERCUT W/FLUORO 08/31/2016 [name], MD [facility]   IR GENERIC HISTORICAL  09/03/2016   IR GJ TUBE CHANGE 09/03/2016 [name], MD [facility]   IR GENERIC HISTORICAL  09/10/2016   IR GASTR TUBE CONVERT GASTR-JEJ PER W/FL MOD SED 09/10/2016 [facility]   IR GENERIC HISTORICAL  09/16/2016   IR PATIENT EVAL TECH 0-60 MINS [facility]   IR GENERIC HISTORICAL  09/29/2016   IR GJ TUBE CHANGE 09/29/2016 [name], MD [facility]   IR GJ TUBE CHANGE  02/05/2017   IR GJ TUBE CHANGE  05/21/2017   IR GJ TUBE CHANGE  08/25/2017   IR GJ TUBE CHANGE  01/18/2018   IR GJ TUBE CHANGE  02/04/2018   IR GJ TUBE CHANGE  04/21/2018   IR GJ TUBE CHANGE  08/18/2018   IR GJ TUBE CHANGE  09/12/2019   IR GJ TUBE CHANGE  01/03/2020   IR GJ TUBE CHANGE  05/13/2020   IR GJ TUBE CHANGE  09/11/2020   IR GJ TUBE CHANGE  02/27/2021   IR GJ TUBE CHANGE  08/01/2021   IR GJ TUBE CHANGE  12/31/2021   IR GJ TUBE CHANGE  05/11/2022   IR MECH REMOV OBSTRUC MAT ANY COLON TUBE W/FLUORO  09/02/2018   IR REPLC GASTRO/COLONIC  TUBE PERCUT W/FLUORO  10/17/2018   PEG PLACEMENT  10/21/2011   Procedure: PERCUTANEOUS ENDOSCOPIC GASTROSTOMY (PEG) REPLACEMENT;  Surgeon: [name], MD;  Location: [facility];  Service: Endoscopy;  Laterality: N/A;   PEG PLACEMENT N/A 06/13/2013   Procedure: PERCUTANEOUS ENDOSCOPIC GASTROSTOMY (PEG) REPLACEMENT;  Surgeon: [name], MD;  Location: [facility];  Service: Endoscopy;  Laterality: N/A;   SPINAL FUSION     spinal fusion to correct 70 degree kyphosis  11-2010   spinal fusioncorrect 106 degree kyphosis     SPINE SURGERY  ,11/20/2010, 2011   for correction of severe contracturing spinal kyphosis.    TONSILLECTOMY      Family History  Problem Relation Age of Onset   Asthma Mother    Hyperlipidemia Mother    COPD Mother    Other Mother        bronchial stasis/ABPA   Cancer Maternal Grandmother 11       breast   Hyperlipidemia Maternal Grandmother    Hypertension Maternal Grandmother    Cancer Maternal Grandfather        prostate   Heart disease Paternal Grandfather        CHF   Osteoporosis Paternal Grandmother    Arthritis Paternal Grandmother        rheumatoid    Social History   Socioeconomic History   Marital status: Single    Spouse name: Not on file   Number of children: 0   Years of education: Not on file   Highest education level: Not on file  Occupational History   Occupation: disbaled  Tobacco Use   Smoking status: Never   Smokeless tobacco: Never  Vaping Use   Vaping Use: Never used  Substance and Sexual Activity   Alcohol use: No   Drug use: No   Sexual activity: Never  Other Topics Concern   Not on file  Social History Narrative   Not on file   Social Determinants of Health   Financial Resource Strain: Not on file  Food Insecurity: Not on file  Transportation Needs: Not on file  Physical Activity: Not on file  Stress: Not on file  Social Connections: Not on file  Intimate Partner Violence: Not on file    Outpatient  Medications Prior to Visit  Medication Sig Dispense Refill   albuterol (PROVENTIL) (2.5 MG/3ML) 0.083% nebulizer solution Take 3 mLs (  2.5 mg total) by nebulization every 6 (six) hours as needed for shortness of breath. 75 mL 5   AMBULATORY NON FORMULARY MEDICATION Medication Name: MIC gastrostomy/bolus feeding tube 24 French Part number 0110-24. #2 and 10 cc lurer lock syringe #2 Dx: 4 Device 2   CYMBALTA 30 MG capsule Take 30 mg by mouth every morning.     CYMBALTA 60 MG capsule Take 60 mg by mouth every morning.     dantrolene (DANTRIUM) 50 MG capsule TAKE 1 CAPSULE BY MOUTH EVERY MORNING, 1 CAPSULE EVERY EVENING, AND 1 CAPSULE AT BEDTIME. 90 capsule 1   DEPAKOTE SPRINKLES 125 MG capsule Take by mouth.     diazepam (VALIUM) 2 MG tablet Take 2 mg by mouth. As needed     diazepam (VALIUM) 5 MG tablet Take 5 mg by mouth 2 (two) times daily.     HYDROcodone bit-homatropine (HYCODAN) 5-1.5 MG/5ML syrup Take 5 mLs by mouth every 6 (six) hours as needed for cough. 240 mL 0   Incontinence Supply Disposable (PREVAIL BREEZERS MEDIUM) MISC pkg of 16- size medium 32" to 44"  Breathable cloth-like outer fabric (can't use the plastic outer surgace  Item # [id number]/[id number] 16 each 6   Misc. Devices (ALL-BODY MASSAGE) MISC 1 Units/hr by Does not apply route as needed. Full body massage for Muscle spasticity due to Cerebral palsy 99 each 99   mupirocin ointment (BACTROBAN) 2 % Apply to area thin film twice daily if needed 22 g 0   NON FORMULARY Bard Leg Bag Extension tubing w/Connector 18", Sterile, latex-free  Item# [id number]     NON FORMULARY Colorplast Freedom Cath Latex Self-Adhering Male External Catheter 29m Diameter Intermediate  Item# [id number]    NONFORMULARY OR COMPOUNDED ITEM Covidien REF [id number]- Kangaroo Joey Pump Set with Flush Bags - 1000 mL 1 each 0   Nutritional Supplements (FEEDING SUPPLEMENT, KATE FARMS STANDARD 1.4,) LIQD liquid Take 325 mLs by mouth as directed. 3 carton over 16 hrs      OLANZapine (ZYPREXA) 5 MG tablet Take by mouth.     Omeprazole-Sodium Bicarbonate (ZEGERID) 20-1100 MG CAPS capsule Take 1 capsule by mouth daily before breakfast.     Ostomy Supplies (PROTECTIVE BARRIER WIPES) MISC 1-1/4" X 3"  Item ##[id number] each 6   oxybutynin (DITROPAN) 5 MG/5ML syrup Take 5 mg by mouth 2 (two) times daily.     PARoxetine (PAXIL) 10 MG tablet Take 15 mg by mouth daily.     sodium chloride HYPERTONIC 3 % nebulizer solution Take by nebulization in the morning and at bedtime. 750 mL 12   sucralfate (CARAFATE) 1 g tablet TAKE 1 TABLET BY MOUTH 4 TIMES DAILY WITH MEALS AND AT BEDTIME 120 tablet 1   ciprofloxacin (CIPRO) 750 MG tablet Take 1 tablet (750 mg total) by mouth 2 (two) times daily. 14 tablet 0   No facility-administered medications prior to visit.    Allergies  Allergen Reactions   Ambien [Zolpidem Tartrate] Nausea Only   Antihistamines, Chlorpheniramine-Type     Other reaction(s): Other (See Comments) Other Reaction: agitation   Augmentin [Amoxicillin-Pot Clavulanate] Diarrhea   Codeine Other (See Comments)    Makes patient too active after a few days.   Metoclopramide Other (See Comments)    Delusion, emotionality    Baclofen Anxiety    anxiety   Pheniramine Rash    Other reaction(s): Other (See Comments) Other Reaction: agitation   Sulfa Antibiotics Rash   Sulfonamide Derivatives Rash  ROS    See HPI Objective:    Physical Exam Constitutional:      Comments: Seated in wheelchair  Cardiovascular:     Rate and Rhythm: Normal rate and regular rhythm.  Pulmonary:     Effort: Pulmonary effort is normal.     Breath sounds: Examination of the right-upper field reveals rhonchi. Examination of the left-upper field reveals rhonchi. Rhonchi present. No wheezing.  Musculoskeletal:     Comments: Muscular atrophy noted bilateral UE/LE   Slight swelling noted bilateral ankles  Neurological:     Mental Status: He is alert.  Psychiatric:      Comments: Alert, mildly agitated at times. Non-verbal     BP (!) 125/92 (BP Location: Left Arm, Patient Position: Sitting, Cuff Size: Small)   Pulse (!) 109   Temp (!) 97.2 F (36.2 C) (Temporal)   Resp 18   SpO2 99%  Wt Readings from Last 3 Encounters:  03/26/22 92 lb (41.7 kg)  02/05/22 95 lb (43.1 kg)  03/26/21 92 lb (41.7 kg)       Assessment & Plan:   Problem List Items Addressed This Visit       Unprioritized   Therapeutic drug monitoring   Relevant Orders   Valproic Acid level   Pain of lower extremity - Primary    Acute on chronic.  Will check labs as below. I would also like to rule out DVT.  Mom plans to bring him to see Dr. Tessa Lerner next week as scheduled for his botox injections.      Relevant Orders   CK (Creatine Kinase)   Comp Met (CMET)   Magnesium   VAS [place] LOWER EXTREMITY VENOUS (DVT)   Acute conjunctivitis of right eye    New. Will rx with ciloxan eye drops.        I have discontinued Lajuana Matte "[name]"'s ciprofloxacin. I am also having him start on ciprofloxacin. Additionally, I am having him maintain his Omeprazole-Sodium Bicarbonate, NON FORMULARY, NON FORMULARY, Protective Barrier Wipes, Prevail Breezers Medium, NONFORMULARY OR COMPOUNDED ITEM, PARoxetine, AMBULATORY NON FORMULARY MEDICATION, mupirocin ointment, All-Body Massage, feeding supplement (KATE FARMS STANDARD 1.4), OLANZapine, oxybutynin, Depakote Sprinkles, sucralfate, diazepam, diazepam, albuterol, Cymbalta, Cymbalta, HYDROcodone bit-homatropine, sodium chloride HYPERTONIC, and dantrolene.  Meds ordered this encounter  Medications   ciprofloxacin (CILOXAN) 0.3 % ophthalmic solution    Sig: Place 2 drops into the left eye every 2 (two) hours for 5 days. Administer 1 drop, every 2 hours, while awake, for 2 days. Then 1 drop, every 4 hours, while awake, for the next 5 days.    Dispense:  5 mL    Refill:  0    Order Specific Question:   Supervising Provider    Answer:   [name]  A [[id number]]

## 2022-06-11 NOTE — Assessment & Plan Note (Signed)
Acute on chronic.  Will check labs as below. I would also like to rule out DVT.  Mom plans to bring him to see Dr. Tessa Lerner next week as scheduled for his botox injections.

## 2022-06-11 NOTE — Telephone Encounter (Signed)
Pt's mother called and is hoping we can talk about possible next options given he was negative for blood clots. Pt has been very agitated and in a lot of pain, he has had about 4 meltdowns with mom and nurse aid since leaving cardiology and they do not know what to do. Pt's mom did contact his neuro psych to see about adjusting meds to help with the meltdowns, but stated pt is very adamant that is not what is wrong. She would like to know if there is anyway Lenna Sciara could order an x-ray of his legs and hips to check for possible fractures as soon as possible.

## 2022-06-11 NOTE — Telephone Encounter (Signed)
Spoke with mother. X-ray orders placed.  She is going to bring him for a neuropsych appointment tomorrow.

## 2022-06-11 NOTE — Assessment & Plan Note (Signed)
New. Will rx with ciloxan eye drops.

## 2022-06-11 NOTE — Telephone Encounter (Signed)
Left message on mother's voicemail that pt's [place] is negative for DVT.

## 2022-06-11 NOTE — Progress Notes (Signed)
Lower extremity venous has been completed.   Preliminary results in CV Proc.   Jinny Blossom Vivian Okelley 06/11/2022 1:39 PM

## 2022-06-11 NOTE — Patient Instructions (Signed)
Please complete lab work prior to leaving.  Complete ultrasound on the first floor. Keep your upcoming appointment with Dr. Tessa Lerner.

## 2022-06-12 ENCOUNTER — Ambulatory Visit (HOSPITAL_BASED_OUTPATIENT_CLINIC_OR_DEPARTMENT_OTHER)
Admission: RE | Admit: 2022-06-12 | Discharge: 2022-06-12 | Disposition: A | Discharge status: Home / Self Care | Payer: Medicare Other | Source: Ambulatory Visit | Attending: Family | Admitting: Family

## 2022-06-12 DIAGNOSIS — M79604 Pain in right leg: Secondary | ICD-10-CM | POA: Diagnosis not present

## 2022-06-12 DIAGNOSIS — M79605 Pain in left leg: Secondary | ICD-10-CM | POA: Diagnosis not present

## 2022-06-12 DIAGNOSIS — M25552 Pain in left hip: Secondary | ICD-10-CM | POA: Diagnosis not present

## 2022-06-12 DIAGNOSIS — M1712 Unilateral primary osteoarthritis, left knee: Secondary | ICD-10-CM | POA: Diagnosis not present

## 2022-06-12 DIAGNOSIS — M25551 Pain in right hip: Secondary | ICD-10-CM | POA: Diagnosis not present

## 2022-06-12 DIAGNOSIS — M1711 Unilateral primary osteoarthritis, right knee: Secondary | ICD-10-CM | POA: Diagnosis not present

## 2022-06-12 DIAGNOSIS — M79651 Pain in right thigh: Secondary | ICD-10-CM | POA: Diagnosis not present

## 2022-06-12 LAB — VALPROIC ACID LEVEL: Valproic Acid Lvl: 83.7 mg/L (ref 50.0–100.0)

## 2022-06-14 ENCOUNTER — Encounter: Payer: Self-pay | Admitting: Physical Medicine & Rehabilitation

## 2022-06-14 ENCOUNTER — Telehealth: Payer: Self-pay | Admitting: Family

## 2022-06-14 DIAGNOSIS — M879 Osteonecrosis, unspecified: Secondary | ICD-10-CM

## 2022-06-14 NOTE — Telephone Encounter (Addendum)
Dr. Charlett Blake, just wanted to keep you in the loop.  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  I am referring him to orthopedics.  I am on vacation this week in case the family has additional questions, I advised them to reach out to you.  Thanks!

## 2022-06-17 ENCOUNTER — Telehealth: Payer: Self-pay | Admitting: Family Medicine

## 2022-06-17 ENCOUNTER — Telehealth: Payer: Medicare Other | Admitting: Family Medicine

## 2022-06-17 ENCOUNTER — Encounter: Payer: Self-pay | Admitting: Physical Medicine & Rehabilitation

## 2022-06-17 ENCOUNTER — Encounter: Payer: Medicare Other | Attending: Physical Medicine & Rehabilitation | Admitting: Physical Medicine & Rehabilitation

## 2022-06-17 VITALS — BP 125/88 | HR 46 | Temp 97.5°F | Ht 60.0 in

## 2022-06-17 DIAGNOSIS — G8250 Quadriplegia, unspecified: Secondary | ICD-10-CM | POA: Diagnosis not present

## 2022-06-17 DIAGNOSIS — G808 Other cerebral palsy: Secondary | ICD-10-CM | POA: Insufficient documentation

## 2022-06-17 NOTE — Telephone Encounter (Signed)
[name] (mother) called stating that she wants to see if there is any way to work in a mychart visit to discuss a neuro referral. She stated that there hasn't been real improvement in the pt condition and wanted to look into other options for treatment.

## 2022-06-17 NOTE — Telephone Encounter (Signed)
Called pt mom and appt made for 8/18 at 9

## 2022-06-17 NOTE — Progress Notes (Signed)
Botox Injection for spasticity using needle EMG guidance Indication: Spastic quadriplegia (HCC)  PALSY, INFANTILE CEREBRAL, QUADRIPLEGIC   Dilution: 100 Units/ml        Total Units Injected: 600 Indication: Severe spasticity which interferes with ADL,mobility and/or  hygiene and is unresponsive to medication management and other conservative care Informed consent was obtained after describing risks and benefits of the procedure with the patient. This includes bleeding, bruising, infection, excessive weakness, or medication side effects. A REMS form is on file and signed.  bilateral Needle: 40m injectable monopolar needle electrode  Number of units per muscle Pectoralis Major 0 units Pectoralis Minor 0 units Biceps 0 units Brachioradialis 0 units FCR 0 units FCU 0 units FDS 0 units FDP 0 units FPL 0 units Pronator Teres 0 units Pronator Quadratus 0 units Lumbricals 0 units Quadriceps 300 units 4 access points each leg (600u total) Gastroc/soleus 0 units Hamstrings 0 units Tibialis Posterior 0 units Tibialis Anterior 0 units EHL 0 units All injections were done after obtaining appropriate EMG activity and after negative drawback for blood. The patient tolerated the procedure well. Post procedure instructions were given. Return in about 3 months (around 09/17/2022) for botox 600units BLE? .Marland Kitchen Recommend a trial of ibuprofen '400mg'$  q8 hours prn along with tylenol for knee pain. We discussed the area of bone infarct along the distal femur which is more an incidental finding than anything else. 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 The pain is more related to spasms, loss of rom, and behavioral decline.

## 2022-06-17 NOTE — Patient Instructions (Signed)
Ibuprofen '400mg'$  every 8 hours as needed back and forth with acetaminophen

## 2022-06-18 ENCOUNTER — Telehealth: Payer: Self-pay

## 2022-06-18 NOTE — Telephone Encounter (Signed)
done

## 2022-06-18 NOTE — Telephone Encounter (Signed)
Got it changed :)

## 2022-06-19 ENCOUNTER — Telehealth (INDEPENDENT_AMBULATORY_CARE_PROVIDER_SITE_OTHER): Payer: Medicare Other | Admitting: Family Medicine

## 2022-06-19 ENCOUNTER — Telehealth: Payer: Self-pay

## 2022-06-19 DIAGNOSIS — G809 Cerebral palsy, unspecified: Secondary | ICD-10-CM | POA: Diagnosis not present

## 2022-06-19 DIAGNOSIS — G4700 Insomnia, unspecified: Secondary | ICD-10-CM | POA: Diagnosis not present

## 2022-06-19 DIAGNOSIS — H1031 Unspecified acute conjunctivitis, right eye: Secondary | ICD-10-CM

## 2022-06-19 DIAGNOSIS — M79606 Pain in leg, unspecified: Secondary | ICD-10-CM

## 2022-06-19 DIAGNOSIS — G808 Other cerebral palsy: Secondary | ICD-10-CM

## 2022-06-19 DIAGNOSIS — F419 Anxiety disorder, unspecified: Secondary | ICD-10-CM | POA: Diagnosis not present

## 2022-06-19 NOTE — Assessment & Plan Note (Signed)
He was struggling with increased agitation and was becoming increasingly difficult with his caregivers. The increase in Zyprexa has helped to a great degree.

## 2022-06-19 NOTE — Telephone Encounter (Signed)
Done

## 2022-06-19 NOTE — Assessment & Plan Note (Signed)
He has previously been seen by neurology and has had a baclofen pump in the past. They are requesting a referral back to neurology for further consideration and consideration of new treatment options to help manage his escalating pain concerns.

## 2022-06-19 NOTE — Assessment & Plan Note (Addendum)
He has trouble falling to sleep due to pain and then if he does fall asleep he wakes within 3 hours. He had his Zyprexa increased to 10 mg by Dr Arn Medal a neuropsychologist and that has helped some. If they think he needs further medication adjustment they will discuss with Dr Tamera Punt.

## 2022-06-19 NOTE — Assessment & Plan Note (Signed)
He is struggling with worsening pain in both legs it is disrupting sleep and causing significant agitation. xrays have revealed degenerative changes in his hips and he is due for a consultation with orthopaedics, Dr Ninfa Linden. His pain is in both legs and his mother reports it is a different pain from with spasticity discomfort. Consider peripheral neuropathy and/or restless leg syndrome. For now alternate Advil 400 mg every 6 hours and tylenol every 6 hours as well as topical treatments and reevaluate in 2-4 weeks.

## 2022-06-19 NOTE — Progress Notes (Signed)
MyChart Video Visit    Virtual Visit via Video Note   This visit type was conducted due to national recommendations for restrictions regarding the COVID-19 Pandemic (e.g. social distancing) in an effort to limit this patient's exposure and mitigate transmission in our community. This patient is at least at moderate risk for complications without adequate follow up. This format is felt to be most appropriate for this patient at this time. Physical exam was limited by quality of the video and audio technology used for the visit. [name], CMA was able to get the patient set up on a video visit.  Patient location: home Patient and provider in visit Provider location: Office  I discussed the limitations of evaluation and management by telemedicine and the availability of in person appointments. The patient expressed understanding and agreed to proceed.  Visit Date: 06/19/2022  Today's healthcare provider: [name], MD     Subjective:    Patient ID: [name], male    DOB: November 29, 1985, 36 y.o.   MRN: [id number]  Chief Complaint  Patient presents with   Referral    Discuss Referral     HPI Patient is in today for evaluation of numerous concerns. His eyes are better with treatment or his conjunctivitis but his pain in hips and legs persists.  He is still struggling with pain but is sleeping some better since his neuropsychiatrist Dr. Tamera Punt increased his Zyprexa.  His pain occurs day and night but is definitely worse at night.  He has trouble getting comfortable and falling asleep.  Once he falls asleep he wakes in a few hours.  He was increasingly agitated and difficult with his caregivers but with the increase in Zyprexa that is improved.  No recent trauma or febrile illness.  They have an appointment with Dr. Ninfa Linden of orthopedics soon to discuss the pathology in his hips. Denies CP/palp/SOB/HA/congestion/fevers/GI or GU c/o. 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   GERD (gastroesophageal reflux disease)    Hyperlipidemia, mild 08/25/2015   Hyperthyroidism    Incontinence of feces    Loss of weight 08/28/2014   Medicare annual wellness visit, subsequent 08/25/2015   Mildly underweight adult 03/16/2017   Palpitations    PALSY, INFANTILE CEREBRAL, 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   Qualifier: Diagnosis of  By: Jerold Coombe  Working with PMR Dr Tessa Lerner and tolerating Botox injections in hips, shoulders, etc with good results   Skin lesion of right ear 03/16/2017   Thyroid disease 08/01/2010   Qualifier: Diagnosis of  By: Harlon Ditty CMA (AAMA), Dottie      Past Surgical History:  Procedure Laterality Date   baclofen trial     baslofen pump implant     ears tubes     EYE SURGERY     FLEXIBLE SIGMOIDOSCOPY N/A 09/07/2014   Procedure: FLEXIBLE SIGMOIDOSCOPY;  Surgeon: [name], MD;  Location: [facility] ENDOSCOPY;  Service: Endoscopy;  Laterality: N/A;   g-tube insert  August 2006   hamstring released     to treat contractures.    HIP SURGERY     x2 , side    IR CM INJ ANY COLONIC TUBE W/FLUORO  05/28/2017   IR CM INJ ANY  COLONIC TUBE W/FLUORO  07/07/2019   IR CM INJ ANY COLONIC TUBE W/FLUORO  09/18/2019   IR GASTR TUBE CONVERT GASTR-JEJ PER W/FL MOD SED  03/14/2019   IR GENERIC HISTORICAL  07/01/2016   IR GASTR TUBE CONVERT GASTR-JEJ PER W/FL MOD SED 07/01/2016 [name], MD [facility]   IR GENERIC HISTORICAL  07/08/2016   IR PATIENT EVAL TECH 0-60 MINS 07/08/2016 [name], MD [facility]   IR GENERIC HISTORICAL  07/14/2016   IR GJ TUBE CHANGE 07/14/2016 [name], MD [facility]   IR GENERIC HISTORICAL  07/21/2016   IR PATIENT EVAL TECH 0-60 MINS [facility]   IR GENERIC HISTORICAL   08/31/2016   IR REPLC DUODEN/JEJUNO TUBE PERCUT W/FLUORO 08/31/2016 [name], MD [facility]   IR GENERIC HISTORICAL  09/03/2016   IR GJ TUBE CHANGE 09/03/2016 [name], MD [facility]   IR GENERIC HISTORICAL  09/10/2016   IR GASTR TUBE CONVERT GASTR-JEJ PER W/FL MOD SED 09/10/2016 [facility]   IR GENERIC HISTORICAL  09/16/2016   IR PATIENT EVAL TECH 0-60 MINS [facility]   IR GENERIC HISTORICAL  09/29/2016   IR GJ TUBE CHANGE 09/29/2016 [name], MD [facility]   IR GJ TUBE CHANGE  02/05/2017   IR GJ TUBE CHANGE  05/21/2017   IR GJ TUBE CHANGE  08/25/2017   IR GJ TUBE CHANGE  01/18/2018   IR GJ TUBE CHANGE  02/04/2018   IR GJ TUBE CHANGE  04/21/2018   IR GJ TUBE CHANGE  08/18/2018   IR GJ TUBE CHANGE  09/12/2019   IR GJ TUBE CHANGE  01/03/2020   IR GJ TUBE CHANGE  05/13/2020   IR GJ TUBE CHANGE  09/11/2020   IR GJ TUBE CHANGE  02/27/2021   IR GJ TUBE CHANGE  08/01/2021   IR GJ TUBE CHANGE  12/31/2021   IR GJ TUBE CHANGE  05/11/2022   IR MECH REMOV OBSTRUC MAT ANY COLON TUBE W/FLUORO  09/02/2018   IR [place] GASTRO/COLONIC TUBE PERCUT W/FLUORO  10/17/2018   PEG PLACEMENT  10/21/2011   Procedure: PERCUTANEOUS ENDOSCOPIC GASTROSTOMY (PEG) REPLACEMENT;  Surgeon: [name], MD;  Location: [facility] ENDOS[facility];  Service: Endoscopy;  Laterality: N/A;   PEG PLACEMENT N/A 06/13/2013   Procedure: PERCUTANEOUS ENDOSCOPIC GASTROSTOMY (PEG) REPLACEMENT;  Surgeon: [name], MD;  Location: [facility] ENDOS[facility];  Service: Endoscopy;  Laterality: N/A;   SPINAL FUSION     spinal fusion to correct 70 degree kyphosis  11-2010   spinal fusioncorrect 106 degree kyphosis     SPINE SURGERY  ,11/20/2010, 2011   for correction of severe contracturing spinal kyphosis.    TONSILLECTOMY      Family History  Problem Relation Age of Onset   Asthma Mother    Hyperlipidemia Mother    COPD Mother    Other Mother        bronchial stasis/ABPA   Cancer Maternal Grandmother 41       breast   Hyperlipidemia  Maternal Grandmother    Hypertension Maternal Grandmother    Cancer Maternal Grandfather        prostate   Heart disease Paternal Grandfather        CHF   Osteoporosis Paternal Grandmother    Arthritis Paternal Grandmother        rheumatoid    Social History   Socioeconomic History   Marital status: Single    Spouse name: Not on file   Number of children: 0  Years of education: Not on file   Highest education level: Not on file  Occupational History   Occupation: disbaled  Tobacco Use   Smoking status: Never   Smokeless tobacco: Never  Vaping Use   Vaping Use: Never used  Substance and Sexual Activity   Alcohol use: No   Drug use: No   Sexual activity: Never  Other Topics Concern   Not on file  Social History Narrative   Not on file   Social Determinants of Health   Financial Resource Strain: Not on file  Food Insecurity: Not on file  Transportation Needs: Not on file  Physical Activity: Not on file  Stress: Not on file  Social Connections: Not on file  Intimate Partner Violence: Not on file    Outpatient Medications Prior to Visit  Medication Sig Dispense Refill   albuterol (PROVENTIL) (2.5 MG/3ML) 0.083% nebulizer solution Take 3 mLs (2.5 mg total) by nebulization every 6 (six) hours as needed for shortness of breath. 75 mL 5   AMBULATORY NON FORMULARY MEDICATION Medication Name: MIC gastrostomy/bolus feeding tube 24 French Part number 0110-24. #2 and 10 cc lurer lock syringe #2 Dx: 4 Device 2   CYMBALTA 30 MG capsule Take 30 mg by mouth every morning.     CYMBALTA 60 MG capsule Take 60 mg by mouth every morning.     dantrolene (DANTRIUM) 50 MG capsule TAKE 1 CAPSULE BY MOUTH EVERY MORNING, 1 CAPSULE EVERY EVENING, AND 1 CAPSULE AT BEDTIME. 90 capsule 1   DEPAKOTE SPRINKLES 125 MG capsule Take by mouth.     diazepam (VALIUM) 2 MG tablet Take 2 mg by mouth. As needed     diazepam (VALIUM) 5 MG tablet Take 5 mg by mouth 2 (two) times daily.     HYDROcodone  bit-homatropine (HYCODAN) 5-1.5 MG/5ML syrup Take 5 mLs by mouth every 6 (six) hours as needed for cough. 240 mL 0   Incontinence Supply Disposable (PREVAIL BREEZERS MEDIUM) MISC pkg of 16- size medium 32" to 44"  Breathable cloth-like outer fabric (can't use the plastic outer surgace  Item # [id number]/[id number] 16 each 6   Misc. Devices (ALL-BODY MASSAGE) MISC 1 Units/hr by Does not apply route as needed. Full body massage for Muscle spasticity due to Cerebral palsy 99 each 99   mupirocin ointment (BACTROBAN) 2 % Apply to area thin film twice daily if needed 22 g 0   NON FORMULARY Bard Leg Bag Extension tubing w/Connector 18", Sterile, latex-free  Item# [id number]     NON FORMULARY Colorplast Freedom Cath Latex Self-Adhering Male External Catheter 16m Diameter Intermediate  Item# [id number]    NONFORMULARY OR COMPOUNDED ITEM Cov[employer]ien REF [id number]- [place]oo Joey Pump Set with Flush Bags - 1000 mL 1 each 0   Nutritional Supplements (FEEDING SUPPLEMENT, KATE FARMS STANDARD 1.4,) LIQD liquid Take 325 mLs by mouth as directed. 3 carton over 16 hrs     OLANZapine (ZYPREXA) 10 MG tablet Take 10 mg by mouth at bedtime.     OLANZapine (ZYPREXA) 5 MG tablet Take by mouth.     Omeprazole-Sodium Bicarbonate (ZEGERID) 20-1100 MG CAPS capsule Take 1 capsule by mouth daily before breakfast.     Ostomy Supplies (PROTECTIVE BARRIER WIPES) MISC 1-1/4" X 3"  Item ##[id number] each 6   oxybutynin (DITROPAN) 5 MG tablet Take 5 mg by mouth 2 (two) times daily.     PARoxetine (PAXIL) 10 MG tablet Take 15 mg by mouth daily.     sodium  chloride HYPERTONIC 3 % nebulizer solution Take by nebulization in the morning and at bedtime. 750 mL 12   sucralfate (CARAFATE) 1 g tablet TAKE 1 TABLET BY MOUTH 4 TIMES DAILY WITH MEALS AND AT BEDTIME 120 tablet 1   No facility-administered medications prior to visit.    Allergies  Allergen Reactions   Ambien [Zolpidem Tartrate] Nausea Only   Antihistamines, Chlorpheniramine-Type      Other reaction(s): Other (See Comments) Other Reaction: agitation   Augmentin [Amoxicillin-Pot Clavulanate] Diarrhea   Codeine Other (See Comments)    Makes patient too active after a few days.   Metoclopramide Other (See Comments)    Delusion, emotionality    Baclofen Anxiety    anxiety   Pheniramine Rash    Other reaction(s): Other (See Comments) Other Reaction: agitation   Sulfa Antibiotics Rash   Sulfonamide Derivatives Rash    Review of Systems  Constitutional:  Positive for malaise/fatigue. Negative for fever.  HENT:  Negative for congestion.   Eyes:  Negative for blurred vision.  Respiratory:  Negative for shortness of breath.   Cardiovascular:  Negative for chest pain, palpitations and leg swelling.  Gastrointestinal:  Negative for abdominal pain, blood in stool and nausea.  Genitourinary:  Negative for dysuria and frequency.  Musculoskeletal:  Positive for back pain and myalgias. Negative for falls.  Skin:  Negative for rash.  Neurological:  Positive for weakness. Negative for dizziness, loss of consciousness and headaches.  Endo/Heme/Allergies:  Negative for environmental allergies.  Psychiatric/Behavioral:  Negative for depression. The patient is not nervous/anxious.        Objective:    Physical Exam Constitutional:      Appearance: Normal appearance. He is not ill-appearing or toxic-appearing.  HENT:     Head: Normocephalic and atraumatic.     Nose: Nose normal.  Pulmonary:     Effort: Pulmonary effort is normal.  Skin:    Findings: No rash.     There were no vitals taken for this visit. Wt Readings from Last 3 Encounters:  03/26/22 92 lb (41.7 kg)  02/05/22 95 lb (43.1 kg)  03/26/21 92 lb (41.7 kg)    Diabetic Foot Exam - Simple   No data filed    Lab Results  Component Value Date   WBC 7.3 08/07/2021   HGB 15.5 08/07/2021   HCT 45.7 08/07/2021   PLT 179.0 Repeated and verified X2. 08/07/2021   GLUCOSE 84 06/11/2022   CHOL 129 08/07/2021    TRIG 80.0 08/07/2021   HDL 37.40 (L) 08/07/2021   LDLCALC 76 08/07/2021   ALT 16 06/11/2022   AST 13 06/11/2022   NA 136 06/11/2022   K 4.2 06/11/2022   CL 98 06/11/2022   CREATININE 0.28 (L) 06/11/2022   BUN 7 06/11/2022   CO2 28 06/11/2022   TSH 1.81 08/07/2021   HGBA1C 5.0 04/29/2007    Lab Results  Component Value Date   TSH 1.81 08/07/2021   Lab Results  Component Value Date   WBC 7.3 08/07/2021   HGB 15.5 08/07/2021   HCT 45.7 08/07/2021   MCV 94.8 08/07/2021   PLT 179.0 Repeated and verified X2. 08/07/2021   Lab Results  Component Value Date   NA 136 06/11/2022   K 4.2 06/11/2022   CO2 28 06/11/2022   GLUCOSE 84 06/11/2022   BUN 7 06/11/2022   CREATININE 0.28 (L) 06/11/2022   BILITOT 0.4 06/11/2022   ALKPHOS 73 06/11/2022   AST 13 06/11/2022   ALT 16  06/11/2022   PROT 7.2 06/11/2022   ALBUMIN 4.5 06/11/2022   CALCIUM 9.1 06/11/2022   ANIONGAP 11 09/18/2014   GFR 154.44 06/11/2022   Lab Results  Component Value Date   CHOL 129 08/07/2021   Lab Results  Component Value Date   HDL 37.40 (L) 08/07/2021   Lab Results  Component Value Date   LDLCALC 76 08/07/2021   Lab Results  Component Value Date   TRIG 80.0 08/07/2021   Lab Results  Component Value Date   CHOLHDL 3 08/07/2021   Lab Results  Component Value Date   HGBA1C 5.0 04/29/2007       Assessment & Plan:   Problem List Items Addressed This Visit     Anxiety    He was struggling with increased agitation and was becoming increasingly difficult with his caregivers. The increase in Zyprexa has helped to a great degree.      Infantile cerebral palsy [facility])    He has previously been seen by neurology and has had a baclofen pump in the past. They are requesting a referral back to neurology for further consideration and consideration of new treatment options to help manage his escalating pain concerns.       Acute conjunctivitis of right eye    Improving with treatment       Pain of lower extremity    He is struggling with worsening pain in both legs it is disrupting sleep and causing significant agitation. xrays have revealed degenerative changes in his hips and he is due for a consultation with orthopaedics, Dr Ninfa Linden. His pain is in both legs and his mother reports it is a different pain from with spasticity discomfort. Consider peripheral neuropathy and/or restless leg syndrome. For now alternate Advil 400 mg every 6 hours and tylenol every 6 hours as well as topical treatments and reevaluate in 2-4 weeks.       Insomnia    He has trouble falling to sleep due to pain and then if he does fall asleep he wakes within 3 hours. He had his Zyprexa increased to 10 mg by Dr Arn Medal a neuropsychologist and that has helped some. If they think he needs further medication adjustment they will discuss with Dr Tamera Punt.       RESOLVED: PALSY, INFANTILE CEREBRAL, QUADRIPLEGIC - Primary   Relevant Orders   Ambulatory referral to Neurology    I am having Chad Matte "[name]" maintain his Omeprazole-Sodium Bicarbonate, NON FORMULARY, NON FORMULARY, Protective Barrier Wipes, Prevail Breezers Medium, NONFORMULARY OR COMPOUNDED ITEM, PARoxetine, AMBULATORY NON FORMULARY MEDICATION, mupirocin ointment, All-Body Massage, feeding supplement (KATE FARMS STANDARD 1.4), OLANZapine, Depakote Sprinkles, sucralfate, diazepam, diazepam, albuterol, Cymbalta, Cymbalta, HYDROcodone bit-homatropine, sodium chloride HYPERTONIC, dantrolene, oxybutynin, and OLANZapine.  No orders of the defined types were placed in this encounter.   I discussed the assessment and treatment plan with the patient. The patient was provided an opportunity to ask questions and all were answered. The patient agreed with the plan and demonstrated an understanding of the instructions.   The patient was advised to call back or seek an in-person evaluation if the symptoms worsen or if the condition fails to improve as  expected seek care.  [name], MD [facility] at [facility] [phone] (phone) [phone] (fax)  [place]

## 2022-06-19 NOTE — Assessment & Plan Note (Signed)
Improving with treatment.  ?

## 2022-06-25 ENCOUNTER — Ambulatory Visit (INDEPENDENT_AMBULATORY_CARE_PROVIDER_SITE_OTHER): Payer: Medicare Other | Admitting: Physician Assistant

## 2022-06-25 ENCOUNTER — Encounter: Payer: Self-pay | Admitting: Physician Assistant

## 2022-06-25 DIAGNOSIS — M25561 Pain in right knee: Secondary | ICD-10-CM | POA: Diagnosis not present

## 2022-06-25 DIAGNOSIS — M25562 Pain in left knee: Secondary | ICD-10-CM | POA: Diagnosis not present

## 2022-06-25 DIAGNOSIS — G8929 Other chronic pain: Secondary | ICD-10-CM | POA: Diagnosis not present

## 2022-06-25 MED ORDER — METHYLPREDNISOLONE ACETATE 40 MG/ML IJ SUSP
40.0000 mg | INTRAMUSCULAR | Status: AC | PRN
  Administered 2022-06-25: 40 mg via INTRA_ARTICULAR

## 2022-06-25 MED ORDER — LIDOCAINE HCL 1 % IJ SOLN
3.0000 mL | INTRAMUSCULAR | Status: AC | PRN
  Administered 2022-06-25: 3 mL

## 2022-06-25 NOTE — Progress Notes (Signed)
Office Visit Note   Patient: [name]           Date of Birth: 1986/08/15           MRN: [id number] Visit Date: 06/25/2022              Requested by: Debbrah Alar, NP [address] [place],  [place] [address] PCP: [name], MD   Assessment & Plan: Visit Diagnoses:  1. Chronic pain of right knee   2. Chronic pain of left knee     Plan: See him back in 6 months for repeat AP and lateral views right distal femur.  This is to evaluate the sclerotic changes in the distal femoral diaphysis.  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  In regards to his knee pain and arthritis he can have cortisone injections in both knees every 3 months if this is helpful.  Questions were encouraged and answered at length with his parents over present today throughout the exam.    Follow-Up Instructions: Return in about 6 months (around 12/26/2022).   Orders:  Orders Placed This Encounter  Procedures   Large Joint Inj   No orders of the defined types were placed in this encounter.     Procedures: Large Joint Inj: bilateral knee on 06/25/2022 5:38 PM Indications: pain Details: 22 G 1.5 in needle, anterolateral approach  Arthrogram: No  Medications (Right): 3 mL lidocaine 1 %; 40 mg methylPREDNISolone acetate 40 MG/ML Medications (Left): 3 mL lidocaine 1 %; 40 mg methylPREDNISolone acetate 40 MG/ML Outcome: tolerated well, no immediate complications Procedure, treatment alternatives, risks and benefits explained, specific risks discussed. Consent was given by the patient. Immediately prior to procedure a time out was called to verify the correct patient, procedure, equipment, support staff and site/side marked as required. Patient was prepped and draped in the usual sterile fashion.       Clinical Data: No additional findings.   Subjective: Chief Complaint  Patient presents with   Right Leg - Pain    HPI Patient is  36 year old male wheelchair-bound.  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  Seen today due to increasing knee pain bilaterally with an incidental finding of a sclerotic lesion in the distal right femur diaphysis.  He has had no new falls or injuries.  He presents with his mother and father.  They state that he is having increasing pain in both knees.  They are having difficulty sleeping as it is causing him extreme out of pain.  Pains been getting worse since March 2023. Left knee radiographs dated 06/12/2022 are reviewed and show sclerotic serpentine white changes in the distal femur diaphysis consistent with a bony infarct.  Bone appears demineralized/osteopenic.  Significant arthritis involving the right knee. Right knee 2 view: Shows demineralized bone no acute fractures.  Osteoarthritic changes in the knee medial lateral compartments. Right femur: Mild to moderate hip arthritis.  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  Osteoarthritis of the right knee.  No acute fractures or acute findings.  Review of Systems See HPI  Objective: Vital Signs: There were no vitals taken for this visit.  Physical Exam Constitutional:      Appearance: He is well-groomed and underweight.  Pulmonary:     Effort: Pulmonary effort is normal.  Neurological:     Mental Status: He is alert.     Ortho  Exam Bilateral knees very limited extension due to spasticity.  No abnormal warmth erythema or effusion of either knee. Specialty Comments:  No specialty comments available.  Imaging: No results found.   PMFS History: Patient Active Problem List   Diagnosis Date Noted   Insomnia 06/19/2022   Acute conjunctivitis of right eye 06/11/2022   Pain of lower extremity 06/11/2022   Therapeutic drug monitoring 06/11/2022   PVD (peripheral vascular disease) ([place]) 08/10/2021   Close exposure to COVID-19 virus 10/16/2019    Mildly underweight adult 03/16/2017   Skin lesion of right ear 03/16/2017   Increased oropharyngeal secretions 04/14/2016   Medicare annual wellness visit, subsequent 08/25/2015   Hyperlipidemia, mild 08/25/2015   Recurrent pneumonia 12/17/2014   Esophageal dysphagia 09/06/2014   Dysphagia, pharyngoesophageal phase 09/02/2014   Impetigo 09/02/2014   Abnormal thyroid function test 06/10/2014   Protein-calorie malnutrition, severe ([place]) 11/23/2013   Decreased oral intake 11/22/2013   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   Spastic quadriplegia ([place]) 08/23/2013   Cervical dystonia 08/23/2013   Dysphagia, oropharyngeal phase 08/10/2013   Low back pain 07/11/2013   PORTAL VEIN THROMBOSIS 12/12/2010   Anemia 12/05/2010   KYPHOSIS 11/11/2010   GASTROSTOMY COMPLICATION 94/49/6759   Infantile cerebral palsy ([place]) 08/04/2010   Thyroid disease 08/01/2010   Anxiety 08/01/2010   Depression with anxiety 08/01/2010   GERD 08/01/2010   OTHER ACNE 10/16/2009   PALPITATIONS 02/25/2009   HIP PAIN, BILATERAL 11/06/2008   NEVI, MULTIPLE 10/09/2008   Incontinence of feces 04/02/2008   Urinary incontinence 04/02/2008   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   Past Medical History:  Diagnosis Date   Cerebral palsy ([place])    Dehydration 11/22/2013   Depression with anxiety 08/01/2010   Qualifier: Diagnosis of  By: Harlon Ditty CMA (AAMA), [place]     Dyslipidemia 08/19/2017   Esophagitis 2011   Gastrostomy in place ([place]) 08/31/2013   GERD (gastroesophageal reflux disease)    Hyperlipidemia, mild 08/25/2015   Hyperthyroidism    Incontinence of feces    Loss of weight 08/28/2014   Medicare annual wellness visit, subsequent 08/25/2015   Mildly underweight adult 03/16/2017   Palpitations    PALSY, INFANTILE CEREBRAL, 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   Qualifier: Diagnosis of  By: Jerold Coombe  Working with PMR Dr Tessa Lerner and tolerating Botox injections in hips, shoulders, etc with  good results   Skin lesion of right ear 03/16/2017   Thyroid disease 08/01/2010   Qualifier: Diagnosis of  By: Harlon Ditty CMA (AAMA), Dottie      Family History  Problem Relation Age of Onset   Asthma Mother    Hyperlipidemia Mother    COPD Mother    Other Mother        bronchial stasis/ABPA   Cancer Maternal Grandmother 6       breast   Hyperlipidemia Maternal Grandmother    Hypertension Maternal Grandmother    Cancer Maternal Grandfather        prostate   Heart disease Paternal Grandfather        CHF   Osteoporosis Paternal Grandmother    Arthritis Paternal Grandmother        rheumatoid    Past Surgical History:  Procedure Laterality Date   baclofen trial     baslofen pump implant     ears tubes     EYE SURGERY     FLEXIBLE SIGMOIDOSCOPY N/A 09/07/2014   Procedure: FLEXIBLE SIGMOIDOSCOPY;  Surgeon: [name], MD;  Location: [facility];  Service: Endoscopy;  Laterality: N/A;   g-tube insert  August 2006   hamstring released     to treat contractures.    HIP SURGERY     x2 , side    IR CM INJ ANY COLONIC TUBE W/FLUORO  05/28/2017   IR CM INJ ANY COLONIC TUBE W/FLUORO  07/07/2019   IR CM INJ ANY COLONIC TUBE W/FLUORO  09/18/2019   IR GASTR TUBE CONVERT GASTR-JEJ PER W/FL MOD SED  03/14/2019   IR GENERIC HISTORICAL  07/01/2016   IR GASTR TUBE CONVERT GASTR-JEJ PER W/FL MOD SED 07/01/2016 [name], MD [facility]   IR GENERIC HISTORICAL  07/08/2016   IR PATIENT EVAL TECH 0-60 MINS 07/08/2016 [name], MD [facility]   IR GENERIC HISTORICAL  07/14/2016   IR GJ TUBE CHANGE 07/14/2016 [name], MD [facility]   IR GENERIC HISTORICAL  07/21/2016   IR PATIENT EVAL TECH 0-60 MINS [facility]   IR GENERIC HISTORICAL  08/31/2016   IR REPLC DUODEN/JEJUNO TUBE PERCUT W/FLUORO 08/31/2016 [name], MD [facility]   IR GENERIC HISTORICAL  09/03/2016   IR GJ TUBE CHANGE 09/03/2016 [name], MD [facility]   IR GENERIC HISTORICAL  09/10/2016   IR GASTR TUBE  CONVERT GASTR-JEJ PER W/FL MOD SED 09/10/2016 [facility]   IR GENERIC HISTORICAL  09/16/2016   IR PATIENT EVAL TECH 0-60 MINS [facility]   IR GENERIC HISTORICAL  09/29/2016   IR GJ TUBE CHANGE 09/29/2016 [name], MD [facility]   IR GJ TUBE CHANGE  02/05/2017   IR GJ TUBE CHANGE  05/21/2017   IR GJ TUBE CHANGE  08/25/2017   IR GJ TUBE CHANGE  01/18/2018   IR GJ TUBE CHANGE  02/04/2018   IR GJ TUBE CHANGE  04/21/2018   IR GJ TUBE CHANGE  08/18/2018   IR GJ TUBE CHANGE  09/12/2019   IR GJ TUBE CHANGE  01/03/2020   IR GJ TUBE CHANGE  05/13/2020   IR GJ TUBE CHANGE  09/11/2020   IR GJ TUBE CHANGE  02/27/2021   IR GJ TUBE CHANGE  08/01/2021   IR GJ TUBE CHANGE  12/31/2021   IR GJ TUBE CHANGE  05/11/2022   IR MECH REMOV OBSTRUC MAT ANY COLON TUBE W/FLUORO  09/02/2018   IR [place] GASTRO/COLONIC TUBE PERCUT W/FLUORO  10/17/2018   PEG PLACEMENT  10/21/2011   Procedure: PERCUTANEOUS ENDOSCOPIC GASTROSTOMY (PEG) REPLACEMENT;  Surgeon: [name], MD;  Location: [facility];  Service: Endoscopy;  Laterality: N/A;   PEG PLACEMENT N/A 06/13/2013   Procedure: PERCUTANEOUS ENDOSCOPIC GASTROSTOMY (PEG) REPLACEMENT;  Surgeon: [name], MD;  Location: [facility];  Service: Endoscopy;  Laterality: N/A;   SPINAL FUSION     spinal fusion to correct 70 degree kyphosis  11-2010   spinal fusioncorrect 106 degree kyphosis     SPINE SURGERY  ,11/20/2010, 2011   for correction of severe contracturing spinal kyphosis.    TONSILLECTOMY     Social History   Occupational History   Occupation: disbaled  Tobacco Use   Smoking status: Never   Smokeless tobacco: Never  Vaping Use   Vaping Use: Never used  Substance and Sexual Activity   Alcohol use: No   Drug use: No   Sexual activity: Never

## 2022-06-25 NOTE — Progress Notes (Signed)
   Procedure Note  Patient: [name]             Date of Birth: [birth year]           MRN: [id number]             Visit Date: 06/25/2022  Procedures: Visit Diagnoses:  1. Chronic pain of right knee   2. Chronic pain of left knee     Large Joint Inj: bilateral knee on 06/25/2022 5:51 PM Indications: pain Details: 22 G 1.5 in needle, anterolateral approach  Arthrogram: No  Medications (Right): 3 mL lidocaine 1 %; 40 mg methylPREDNISolone acetate 40 MG/ML Medications (Left): 3 mL lidocaine 1 %; 40 mg methylPREDNISolone acetate 40 MG/ML Outcome: tolerated well, no immediate complications Procedure, treatment alternatives, risks and benefits explained, specific risks discussed. Consent was given by the patient and parent. Immediately prior to procedure a time out was called to verify the correct patient, procedure, equipment, support staff and site/side marked as required. Patient was prepped and draped in the usual sterile fashion.

## 2022-06-30 ENCOUNTER — Other Ambulatory Visit (HOSPITAL_COMMUNITY): Payer: Self-pay | Admitting: Radiology

## 2022-06-30 DIAGNOSIS — R633 Feeding difficulties, unspecified: Secondary | ICD-10-CM

## 2022-07-01 ENCOUNTER — Ambulatory Visit (HOSPITAL_COMMUNITY)
Admission: RE | Admit: 2022-07-01 | Discharge: 2022-07-01 | Disposition: A | Discharge status: Home / Self Care | Payer: Medicare Other | Source: Ambulatory Visit | Attending: Radiology | Admitting: Radiology

## 2022-07-01 DIAGNOSIS — R633 Feeding difficulties, unspecified: Secondary | ICD-10-CM | POA: Insufficient documentation

## 2022-07-01 DIAGNOSIS — Z431 Encounter for attention to gastrostomy: Secondary | ICD-10-CM | POA: Diagnosis not present

## 2022-07-01 DIAGNOSIS — K9413 Enterostomy malfunction: Secondary | ICD-10-CM | POA: Diagnosis not present

## 2022-07-01 HISTORY — PX: IR GJ TUBE CHANGE: IMG1440

## 2022-07-01 MED ORDER — IOHEXOL 300 MG/ML  SOLN
50.0000 mL | Freq: Once | INTRAMUSCULAR | Status: AC | PRN
  Administered 2022-07-01: 15 mL

## 2022-07-01 NOTE — Telephone Encounter (Signed)
IC to work in for this afternoon but patient had procedure. I scheduled him for Dr Marlou Sa on 09/20

## 2022-07-10 ENCOUNTER — Ambulatory Visit: Payer: Medicare Other | Admitting: Pulmonary Disease

## 2022-07-13 ENCOUNTER — Telehealth: Payer: Self-pay | Admitting: Family Medicine

## 2022-07-13 NOTE — Telephone Encounter (Signed)
Sharyn Lull with [employer] (DME company) called to confirm receipt of the renewal paperwork but when we confirmed fax numbers, they were not correct. She will refax.

## 2022-07-17 ENCOUNTER — Telehealth (INDEPENDENT_AMBULATORY_CARE_PROVIDER_SITE_OTHER): Payer: Medicare Other | Admitting: Family Medicine

## 2022-07-17 ENCOUNTER — Ambulatory Visit: Payer: Medicare Other | Admitting: Pulmonary Disease

## 2022-07-17 DIAGNOSIS — M25561 Pain in right knee: Secondary | ICD-10-CM | POA: Diagnosis not present

## 2022-07-17 DIAGNOSIS — G809 Cerebral palsy, unspecified: Secondary | ICD-10-CM | POA: Diagnosis not present

## 2022-07-17 DIAGNOSIS — M25562 Pain in left knee: Secondary | ICD-10-CM | POA: Diagnosis not present

## 2022-07-17 DIAGNOSIS — M25559 Pain in unspecified hip: Secondary | ICD-10-CM | POA: Diagnosis not present

## 2022-07-17 DIAGNOSIS — G4700 Insomnia, unspecified: Secondary | ICD-10-CM

## 2022-07-19 NOTE — Assessment & Plan Note (Signed)
His psychiatrist Dr Tamera Punt has added Trazodone and his sleep is better but he indicates he is overly groggy in the morning. They will discuss with Dr Tamera Punt and consider decreasing dose from 50 mg down.

## 2022-07-19 NOTE — Assessment & Plan Note (Signed)
Has responded to injections and he is feeling much better.

## 2022-07-19 NOTE — Assessment & Plan Note (Signed)
He has been struggling with knee pain and they were going to try injections but he is doing better now so they are likely not going to proceed with injections.

## 2022-07-19 NOTE — Progress Notes (Addendum)
Virtual telephone visit    Virtual Visit via Telephone Note   This visit type was conducted due to national recommendations for restrictions regarding the COVID-19 Pandemic (e.g. social distancing) in an effort to limit this patient's exposure and mitigate transmission in our community. Due to his co-morbid illnesses, this patient is at least at moderate risk for complications without adequate follow up. This format is felt to be most appropriate for this patient at this time. The patient did not have access to video technology or had technical difficulties with video requiring transitioning to audio format only (telephone). Physical exam was limited to content and character of the telephone converstion. [name], CMA was able to get the patient set up on a telephone visit.   Patient location: home Patient, his aide, [name] and provider in visit Provider location: Office  I discussed the limitations of evaluation and management by telemedicine and the availability of in person appointments. The patient expressed understanding and agreed to proceed.   Visit Date: 07/17/2022  Today's healthcare provider: [name], MD     Subjective:    Patient ID: [name], male    DOB: 1985-12-23, 36 y.o.   MRN: [id number]  Chief Complaint  Patient presents with   Follow-up    Follow up     HPI Patient is in today for follow up on chronic medical concerns. He is seen with his aide, [name] is also discussed with his mother as well. His psychiatrist started Trazodone and that has helped him sleep but he notes he is left groggy in the morning. No other new concerns. No recent febrile illness or hospitalizations. Denies CP/palp/HA/congestion/fevers/GI or GU c/o. 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   GERD (gastroesophageal reflux disease)    Hyperlipidemia, mild 08/25/2015   Hyperthyroidism    Incontinence of feces    Loss of weight 08/28/2014   Medicare annual wellness visit, subsequent 08/25/2015   Mildly underweight adult 03/16/2017   Palpitations    PALSY, INFANTILE CEREBRAL, 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   Qualifier: Diagnosis of  By: Jerold Coombe  Working with PMR Dr Tessa Lerner and tolerating Botox injections in hips, shoulders, etc with good results   Skin lesion of right ear 03/16/2017   Thyroid disease 08/01/2010   Qualifier: Diagnosis of  By: Harlon Ditty CMA (AAMA), Dottie      Past Surgical History:  Procedure Laterality Date   baclofen trial     baslofen pump implant     ears tubes     EYE SURGERY     FLEXIBLE SIGMOIDOSCOPY N/A 09/07/2014   Procedure: FLEXIBLE SIGMOIDOSCOPY;  Surgeon: [name], MD;  Location: [facility] ENDOSCOPY;  Service: Endoscopy;  Laterality: N/A;   g-tube insert  August 2006   hamstring released     to treat contractures.    HIP SURGERY     x2 , side    IR CM INJ ANY COLONIC TUBE W/FLUORO  05/28/2017   IR CM INJ ANY COLONIC TUBE W/FLUORO  07/07/2019   IR CM INJ ANY COLONIC TUBE W/FLUORO  09/18/2019   IR GASTR TUBE CONVERT GASTR-JEJ PER W/FL MOD SED  03/14/2019   IR GENERIC HISTORICAL  07/01/2016  IR GASTR TUBE CONVERT GASTR-JEJ PER W/FL MOD SED 07/01/2016 [name], MD [facility]   IR GENERIC HISTORICAL  07/08/2016   IR PATIENT EVAL TECH 0-60 MINS 07/08/2016 [name], MD [facility]   IR GENERIC HISTORICAL  07/14/2016   IR GJ TUBE CHANGE 07/14/2016 [name], MD [facility]   IR GENERIC HISTORICAL  07/21/2016   IR PATIENT EVAL TECH 0-60 MINS [facility]   IR GENERIC HISTORICAL  08/31/2016   IR REPLC DUODEN/JEJUNO TUBE PERCUT W/FLUORO 08/31/2016 [name], MD [facility]   IR GENERIC HISTORICAL  09/03/2016   IR GJ TUBE CHANGE 09/03/2016 [name], MD [facility]   IR GENERIC HISTORICAL  09/10/2016   IR GASTR TUBE CONVERT GASTR-JEJ PER W/FL MOD SED 09/10/2016 [facility]   IR GENERIC HISTORICAL  09/16/2016   IR PATIENT EVAL TECH 0-60 MINS [facility]   IR GENERIC HISTORICAL  09/29/2016   IR GJ TUBE CHANGE 09/29/2016 [name], MD [facility]   IR GJ TUBE CHANGE  02/05/2017   IR GJ TUBE CHANGE  05/21/2017   IR GJ TUBE CHANGE  08/25/2017   IR GJ TUBE CHANGE  01/18/2018   IR GJ TUBE CHANGE  02/04/2018   IR GJ TUBE CHANGE  04/21/2018   IR GJ TUBE CHANGE  08/18/2018   IR GJ TUBE CHANGE  09/12/2019   IR GJ TUBE CHANGE  01/03/2020   IR GJ TUBE CHANGE  05/13/2020   IR GJ TUBE CHANGE  09/11/2020   IR GJ TUBE CHANGE  02/27/2021   IR GJ TUBE CHANGE  08/01/2021   IR GJ TUBE CHANGE  12/31/2021   IR GJ TUBE CHANGE  05/11/2022   IR GJ TUBE CHANGE  07/01/2022   IR MECH REMOV OBSTRUC MAT ANY COLON TUBE W/FLUORO  09/02/2018   IR Anna GASTRO/COLONIC TUBE PERCUT W/FLUORO  10/17/2018   PEG PLACEMENT  10/21/2011   Procedure: PERCUTANEOUS ENDOSCOPIC GASTROSTOMY (PEG) REPLACEMENT;  Surgeon: [name], MD;  Location: [facility];  Service: Endoscopy;  Laterality: N/A;   PEG PLACEMENT N/A 06/13/2013   Procedure: PERCUTANEOUS ENDOSCOPIC GASTROSTOMY (PEG) REPLACEMENT;  Surgeon: [name], MD;  Location: [facility];  Service: Endoscopy;  Laterality: N/A;   SPINAL FUSION     spinal fusion to correct 70 degree kyphosis  11-2010   spinal fusioncorrect 106 degree kyphosis     SPINE SURGERY  ,11/20/2010, 2011   for correction of severe contracturing spinal kyphosis.    TONSILLECTOMY      Family History  Problem Relation Age of Onset   Asthma Mother    Hyperlipidemia Mother    COPD Mother    Other Mother        bronchial stasis/ABPA   Cancer Maternal Grandmother 55       breast   Hyperlipidemia Maternal Grandmother    Hypertension Maternal Grandmother    Cancer Maternal Grandfather        prostate   Heart disease Paternal Grandfather         CHF   Osteoporosis Paternal Grandmother    Arthritis Paternal Grandmother        rheumatoid    Social History   Socioeconomic History   Marital status: Single    Spouse name: Not on file   Number of children: 0   Years of education: Not on file   Highest education level: Not on file  Occupational History   Occupation: disbaled  Tobacco Use   Smoking status: Never  Smokeless tobacco: Never  Vaping Use   Vaping Use: Never used  Substance and Sexual Activity   Alcohol use: No   Drug use: No   Sexual activity: Never  Other Topics Concern   Not on file  Social History Narrative   Not on file   Social Determinants of Health   Financial Resource Strain: Not on file  Food Insecurity: Not on file  Transportation Needs: Not on file  Physical Activity: Not on file  Stress: Not on file  Social Connections: Not on file  Intimate Partner Violence: Not on file    Outpatient Medications Prior to Visit  Medication Sig Dispense Refill   albuterol (PROVENTIL) (2.5 MG/3ML) 0.083% nebulizer solution Take 3 mLs (2.5 mg total) by nebulization every 6 (six) hours as needed for shortness of breath. 75 mL 5   AMBULATORY NON FORMULARY MEDICATION Medication Name: MIC gastrostomy/bolus feeding tube 24 French Part number 0110-24. #2 and 10 cc lurer lock syringe #2 Dx: 4 Device 2   CYMBALTA 30 MG capsule Take 30 mg by mouth every morning.     CYMBALTA 60 MG capsule Take 60 mg by mouth every morning.     DEPAKOTE SPRINKLES 125 MG capsule Take by mouth.     diazepam (VALIUM) 2 MG tablet Take 2 mg by mouth. As needed     diazepam (VALIUM) 5 MG tablet Take 5 mg by mouth 2 (two) times daily.     HYDROcodone bit-homatropine (HYCODAN) 5-1.5 MG/5ML syrup Take 5 mLs by mouth every 6 (six) hours as needed for cough. 240 mL 0   Incontinence Supply Disposable (PREVAIL BREEZERS MEDIUM) MISC pkg of 16- size medium 32" to 44"  Breathable cloth-like outer fabric (can't use the plastic outer  surgace  Item # [id number]/[id number] 16 each 6   Misc. Devices (ALL-BODY MASSAGE) MISC 1 Units/hr by Does not apply route as needed. Full body massage for Muscle spasticity due to Cerebral palsy 99 each 99   mupirocin ointment (BACTROBAN) 2 % Apply to area thin film twice daily if needed 22 g 0   NON FORMULARY Bard Leg Bag Extension tubing w/Connector 18", Sterile, latex-free  Item# [id number]     NON FORMULARY Colorplast Freedom Cath Latex Self-Adhering Male External Catheter 42m Diameter Intermediate  Item# [id number]    NONFORMULARY OR COMPOUNDED ITEM [employer] REF [id number] [place] Joey Pump Set with Flush Bags - 1000 mL 1 each 0   Nutritional Supplements (FEEDING SUPPLEMENT, KATE FARMS STANDARD 1.4,) LIQD liquid Take 325 mLs by mouth as directed. 3 carton over 16 hrs     OLANZapine (ZYPREXA) 10 MG tablet Take 10 mg by mouth at bedtime.     OLANZapine (ZYPREXA) 5 MG tablet Take by mouth.     Omeprazole-Sodium Bicarbonate (ZEGERID) 20-1100 MG CAPS capsule Take 1 capsule by mouth daily before breakfast.     Ostomy Supplies (PROTECTIVE BARRIER WIPES) MISC 1-1/4" X 3"  Item ##[id number] each 6   oxybutynin (DITROPAN) 5 MG tablet Take 5 mg by mouth 2 (two) times daily.     PARoxetine (PAXIL) 10 MG tablet Take 15 mg by mouth daily.     sodium chloride HYPERTONIC 3 % nebulizer solution Take by nebulization in the morning and at bedtime. 750 mL 12   sucralfate (CARAFATE) 1 g tablet TAKE 1 TABLET BY MOUTH 4 TIMES DAILY WITH MEALS AND AT BEDTIME 120 tablet 1   dantrolene (DANTRIUM) 50 MG capsule TAKE 1 CAPSULE BY MOUTH EVERY MORNING, 1 CAPSULE  EVERY EVENING, AND 1 CAPSULE AT BEDTIME. 90 capsule 1   No facility-administered medications prior to visit.    Allergies  Allergen Reactions   Ambien [Zolpidem Tartrate] Nausea Only   Antihistamines, Chlorpheniramine-Type     Other reaction(s): Other (See Comments) Other Reaction: agitation   Augmentin [Amoxicillin-Pot Clavulanate] Diarrhea   Codeine Other (See  Comments)    Makes patient too active after a few days.   Metoclopramide Other (See Comments)    Delusion, emotionality    Baclofen Anxiety    anxiety   Pheniramine Rash    Other reaction(s): Other (See Comments) Other Reaction: agitation   Sulfa Antibiotics Rash   Sulfonamide Derivatives Rash    Review of Systems  Constitutional:  Negative for fever and malaise/fatigue.  HENT:  Negative for congestion.   Eyes:  Negative for blurred vision.  Respiratory:  Negative for shortness of breath.   Cardiovascular:  Negative for chest pain, palpitations and leg swelling.  Gastrointestinal:  Negative for abdominal pain, blood in stool and nausea.  Genitourinary:  Negative for dysuria and frequency.  Musculoskeletal:  Positive for joint pain. Negative for falls.  Skin:  Negative for rash.  Neurological:  Negative for dizziness, loss of consciousness and headaches.  Endo/Heme/Allergies:  Negative for environmental allergies.  Psychiatric/Behavioral:  Negative for depression. The patient is nervous/anxious and has insomnia.        Objective:    Physical Exam unable to obtain via phone visit.   BP 117/81 (BP Location: Right Arm, Patient Position: Sitting, Cuff Size: Normal)   Pulse 98   Temp 98 F (36.7 C) (Oral)   SpO2 96%  Wt Readings from Last 3 Encounters:  03/26/22 92 lb (41.7 kg)  02/05/22 95 lb (43.1 kg)  03/26/21 92 lb (41.7 kg)    Diabetic Foot Exam - Simple   No data filed    Lab Results  Component Value Date   WBC 7.3 08/07/2021   HGB 15.5 08/07/2021   HCT 45.7 08/07/2021   PLT 179.0 Repeated and verified X2. 08/07/2021   GLUCOSE 84 06/11/2022   CHOL 129 08/07/2021   TRIG 80.0 08/07/2021   HDL 37.40 (L) 08/07/2021   LDLCALC 76 08/07/2021   ALT 16 06/11/2022   AST 13 06/11/2022   NA 136 06/11/2022   K 4.2 06/11/2022   CL 98 06/11/2022   CREATININE 0.28 (L) 06/11/2022   BUN 7 06/11/2022   CO2 28 06/11/2022   TSH 1.81 08/07/2021   HGBA1C 5.0 04/29/2007     Lab Results  Component Value Date   TSH 1.81 08/07/2021   Lab Results  Component Value Date   WBC 7.3 08/07/2021   HGB 15.5 08/07/2021   HCT 45.7 08/07/2021   MCV 94.8 08/07/2021   PLT 179.0 Repeated and verified X2. 08/07/2021   Lab Results  Component Value Date   NA 136 06/11/2022   K 4.2 06/11/2022   CO2 28 06/11/2022   GLUCOSE 84 06/11/2022   BUN 7 06/11/2022   CREATININE 0.28 (L) 06/11/2022   BILITOT 0.4 06/11/2022   ALKPHOS 73 06/11/2022   AST 13 06/11/2022   ALT 16 06/11/2022   PROT 7.2 06/11/2022   ALBUMIN 4.5 06/11/2022   CALCIUM 9.1 06/11/2022   ANIONGAP 11 09/18/2014   GFR 154.44 06/11/2022   Lab Results  Component Value Date   CHOL 129 08/07/2021   Lab Results  Component Value Date   HDL 37.40 (L) 08/07/2021   Lab Results  Component Value Date  [place] 76 08/07/2021   Lab Results  Component Value Date   TRIG 80.0 08/07/2021   Lab Results  Component Value Date   CHOLHDL 3 08/07/2021   Lab Results  Component Value Date   HGBA1C 5.0 04/29/2007       Assessment & Plan:   Problem List Items Addressed This Visit     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 Patient will continue to need external catheterization lifelong      HIP PAIN, BILATERAL    He has been struggling with knee pain and they were going to try injections but he is doing better now so they are likely not going to proceed with injections.       Bilateral knee pain    Has responded to injections and he is feeling much better.       Insomnia    His psychiatrist Dr Tamera Punt has added Trazodone and his sleep is better but he indicates he is overly groggy in the morning. They will discuss with Dr Tamera Punt and consider decreasing dose from 50 mg down.        I am having Chad Matte "[name]" maintain his Omeprazole-Sodium Bicarbonate, NON FORMULARY, NON FORMULARY, Protective Barrier Wipes, Prevail Breezers Medium, NONFORMULARY OR COMPOUNDED ITEM,  PARoxetine, AMBULATORY NON FORMULARY MEDICATION, mupirocin ointment, All-Body Massage, feeding supplement (KATE FARMS STANDARD 1.4), OLANZapine, Depakote Sprinkles, sucralfate, diazepam, diazepam, albuterol, Cymbalta, Cymbalta, HYDROcodone bit-homatropine, sodium chloride HYPERTONIC, oxybutynin, and OLANZapine.  No orders of the defined types were placed in this encounter.    I discussed the assessment and treatment plan with the patient. The patient was provided an opportunity to ask questions and all were answered. The patient agreed with the plan and demonstrated an understanding of the instructions.   The patient was advised to call back or seek an in-person evaluation if the symptoms worsen or if the condition fails to improve as anticipated.  I provided 35 minutes of non-face-to-face time during this encounter.   [name], MD [facility] [phone] (phone) [phone] (fax)  [place]

## 2022-07-20 ENCOUNTER — Other Ambulatory Visit: Payer: Self-pay | Admitting: Family Medicine

## 2022-07-20 ENCOUNTER — Telehealth: Payer: Self-pay | Admitting: Family Medicine

## 2022-07-20 ENCOUNTER — Other Ambulatory Visit: Payer: Self-pay | Admitting: Family

## 2022-07-20 DIAGNOSIS — F339 Major depressive disorder, recurrent, unspecified: Secondary | ICD-10-CM | POA: Diagnosis not present

## 2022-07-20 MED ORDER — CIPROFLOXACIN HCL 0.3 % OP SOLN: OPHTHALMIC | 0 refills | Status: DC

## 2022-07-20 NOTE — Telephone Encounter (Signed)
Spoke to mom and she reports symptoms. Rx sent

## 2022-07-20 NOTE — Telephone Encounter (Signed)
[name] called stating that pt pink eye has returned as of 9.18.23 and was wondering if the same Rx that was called in on 8.10.23 could be called in. [name] stated that it is not weeping and not as intense as the last time. Rx is as follows:  Medication:   ciprofloxacin (CILOXAN) 0.3 % ophthalmic solution [[id number]]  Has the patient contacted their pharmacy? No. (If no, request that the patient contact the pharmacy for the refill.) (If yes, when and what did the pharmacy advise?)  Preferred Pharmacy (with phone number or street name):   [place], [place] - [address]  [place], [place] [address]  Phone:  [phone]  Fax:  [phone]  Agent: Please be advised that RX refills may take up to 3 business days. We ask that you follow-up with your pharmacy.

## 2022-07-21 NOTE — Telephone Encounter (Signed)
Sent a message

## 2022-07-22 ENCOUNTER — Ambulatory Visit: Payer: Medicare Other | Admitting: Orthopedic Surgery

## 2022-07-27 ENCOUNTER — Telehealth: Payer: Self-pay | Admitting: Family Medicine

## 2022-07-27 NOTE — Telephone Encounter (Signed)
[employer] [facility] states they are still needing OV notes for renewal of incontinence supplies. Confirmed fax and they are sending over form again. Number listed in contacts for any questions.

## 2022-07-27 NOTE — Telephone Encounter (Signed)
[name] (mother DPR OK) called stating that she would like to have a virtual appt to discuss several things regarding pt plan of care. As of 9.25.23, no appts are available on the books till December.

## 2022-07-28 ENCOUNTER — Telehealth: Payer: Self-pay | Admitting: Pulmonary Disease

## 2022-07-28 ENCOUNTER — Other Ambulatory Visit: Payer: Self-pay | Admitting: Physical Medicine & Rehabilitation

## 2022-07-28 DIAGNOSIS — G8250 Quadriplegia, unspecified: Secondary | ICD-10-CM

## 2022-07-28 DIAGNOSIS — G809 Cerebral palsy, unspecified: Secondary | ICD-10-CM

## 2022-07-28 NOTE — Telephone Encounter (Signed)
Spoke with [name], pt's mother. She states that in the past 2 weeks patient has had a couple of episodes of blood tinged sputum when suctioning. The last time he had this was when he was dx with pneumonia. She is not sure If this is coming from his esophagus or his lungs. Denies any other increase in respiratory symptoms. Denies FCS or increase in cough or need to suction. PT has not been using Albuterol neb or Hypertonic solution recently. She did notice some dried blood in his mouth this morning after waking but not much. She wants Dr Matilde Bash advice before calling his GI Dr Hilarie Fredrickson.  Please advise.

## 2022-07-28 NOTE — Telephone Encounter (Signed)
Called pt and VV appt made 10/05 1:20

## 2022-07-28 NOTE — Telephone Encounter (Signed)
Mother states pt is coughing up blood when they suction him. Pt has cerebal palsy. She is unsure if this is a lung issue or not as pt does not have excessive coughing otherwise. Please advise.

## 2022-07-29 NOTE — Telephone Encounter (Signed)
If he is not having significant breathing issues or chest congestion we can observe for now.  If symptoms continue then we may consider CT chest.  Please have mom call us back with an update.

## 2022-07-29 NOTE — Telephone Encounter (Signed)
We have paperwork and in Dr. Charlett Blake bin to sign

## 2022-07-30 NOTE — Telephone Encounter (Signed)
Called patient's mother but she did not answer. Left message for her to call back.

## 2022-07-30 NOTE — Telephone Encounter (Signed)
Spoke with [name] and reviewed Dr. Matilde Bash advise. [name] states that cough had slightly increased but Mucinex seemed to help and no chest congestion. [name] stated understanding to observe and agreed. Nothing further needed at this time.    Routing to Dr. Vaughan Browner as Juluis Rainier

## 2022-07-30 NOTE — Telephone Encounter (Signed)
[name] mother is returning phone call. Linda phone number is [phone].

## 2022-08-01 ENCOUNTER — Other Ambulatory Visit: Payer: Self-pay | Admitting: Physical Medicine & Rehabilitation

## 2022-08-01 DIAGNOSIS — G8250 Quadriplegia, unspecified: Secondary | ICD-10-CM

## 2022-08-01 DIAGNOSIS — G809 Cerebral palsy, unspecified: Secondary | ICD-10-CM

## 2022-08-04 NOTE — Assessment & Plan Note (Signed)
With spastic Quadriplegia. Patient will continue to need external catheterization lifelong

## 2022-08-06 ENCOUNTER — Telehealth (INDEPENDENT_AMBULATORY_CARE_PROVIDER_SITE_OTHER): Payer: Medicare Other | Admitting: Family Medicine

## 2022-08-06 ENCOUNTER — Encounter: Payer: Self-pay | Admitting: Family Medicine

## 2022-08-06 DIAGNOSIS — Z931 Gastrostomy status: Secondary | ICD-10-CM | POA: Diagnosis not present

## 2022-08-06 DIAGNOSIS — R32 Unspecified urinary incontinence: Secondary | ICD-10-CM

## 2022-08-06 DIAGNOSIS — F418 Other specified anxiety disorders: Secondary | ICD-10-CM

## 2022-08-06 DIAGNOSIS — M25559 Pain in unspecified hip: Secondary | ICD-10-CM

## 2022-08-06 DIAGNOSIS — G8250 Quadriplegia, unspecified: Secondary | ICD-10-CM | POA: Diagnosis not present

## 2022-08-06 IMAGING — XA IR REPLACE G/J TUBE W/ FLUORO
1 series · 4 of 4 positions shown · non-contrast
Comparison: none

INDICATION: Routine exchange of gastrojejunostomy tube

[Series 1: processed: ir gj tube change · 4 of 25 frames shown]
[frame 4/25]
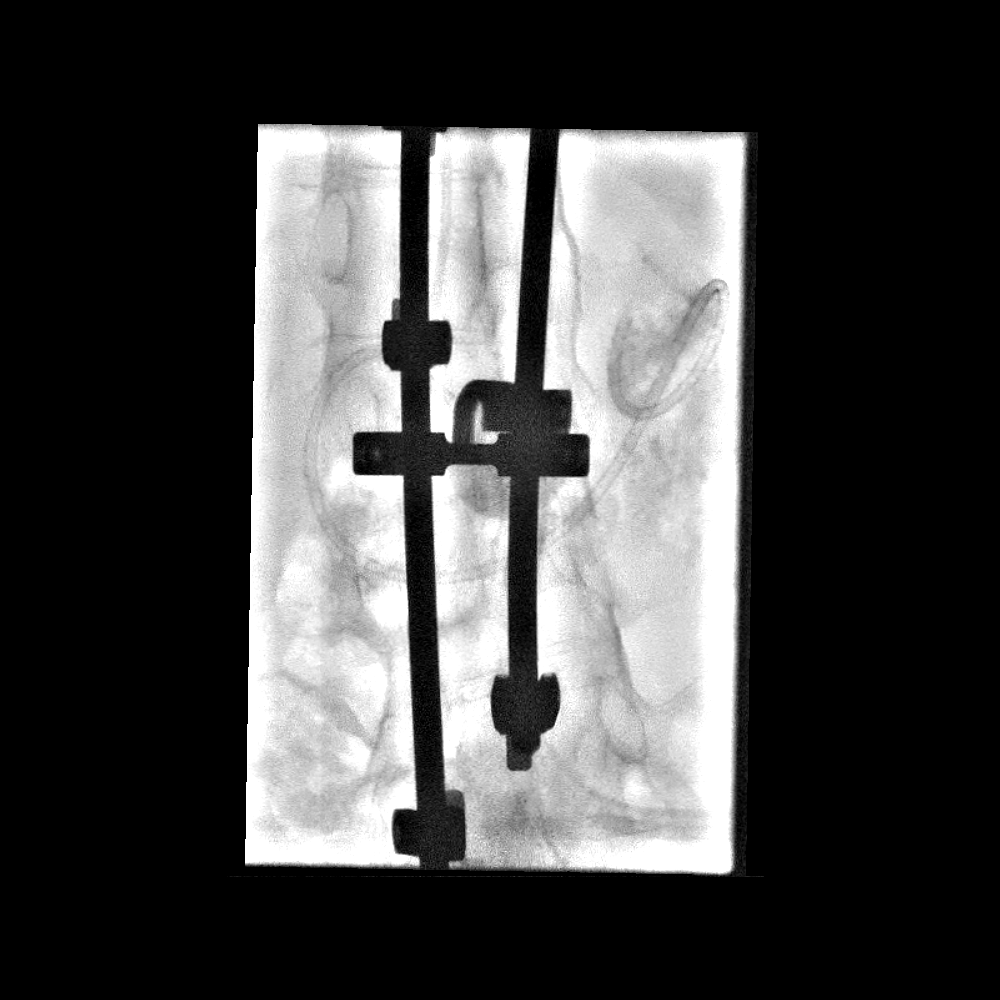
[frame 7/25]
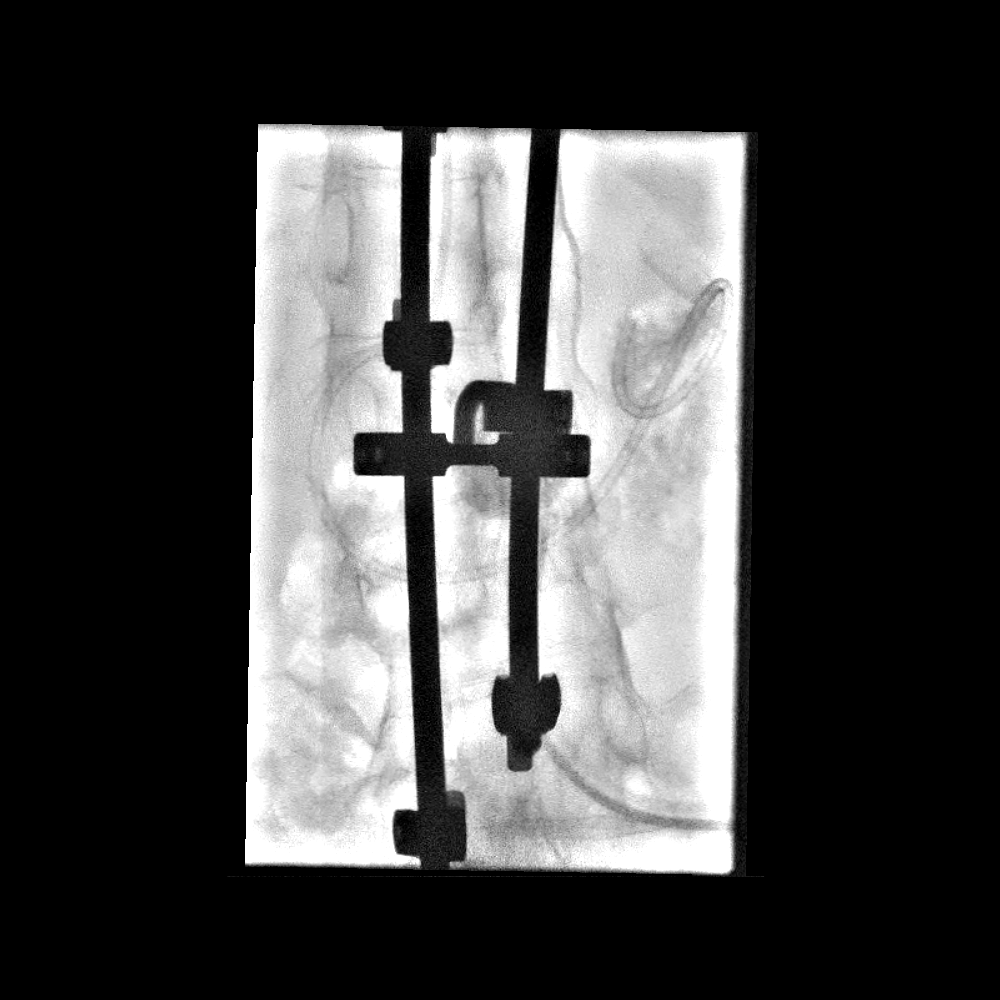
[frame 13/25]
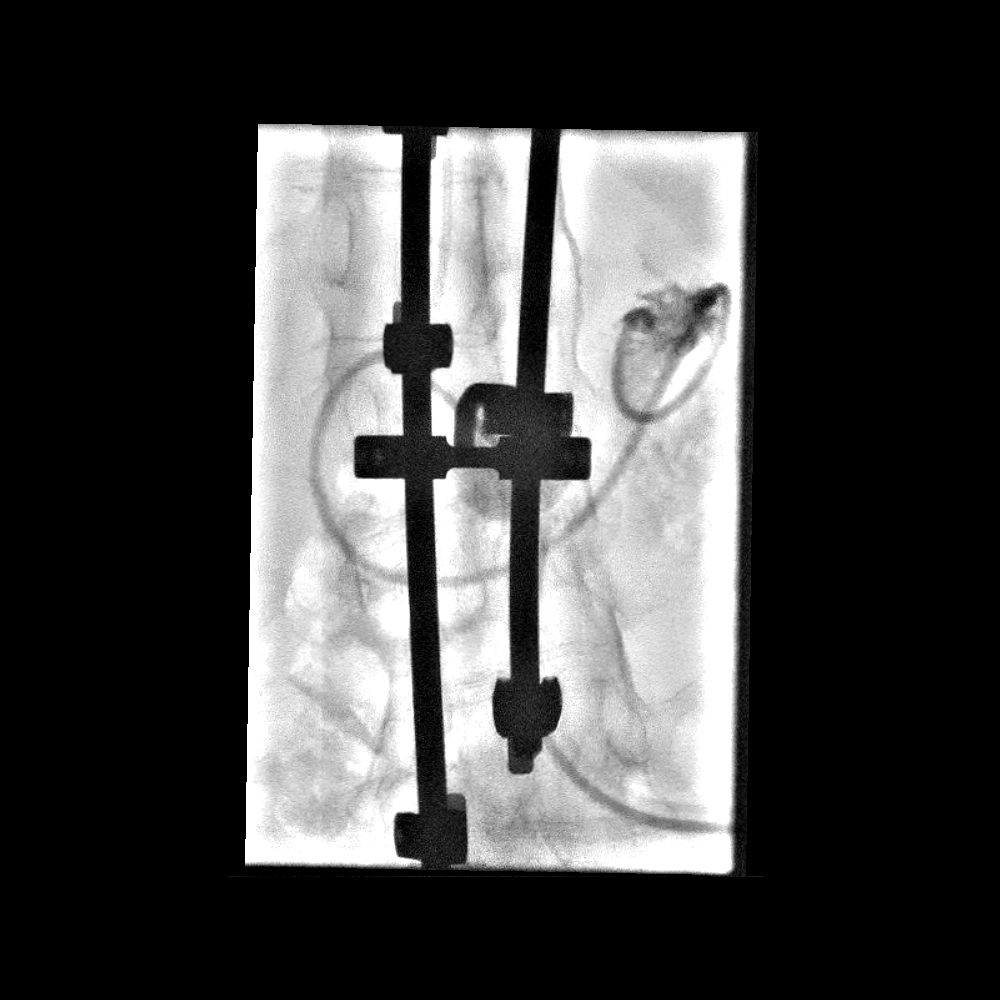
[frame 22/25]
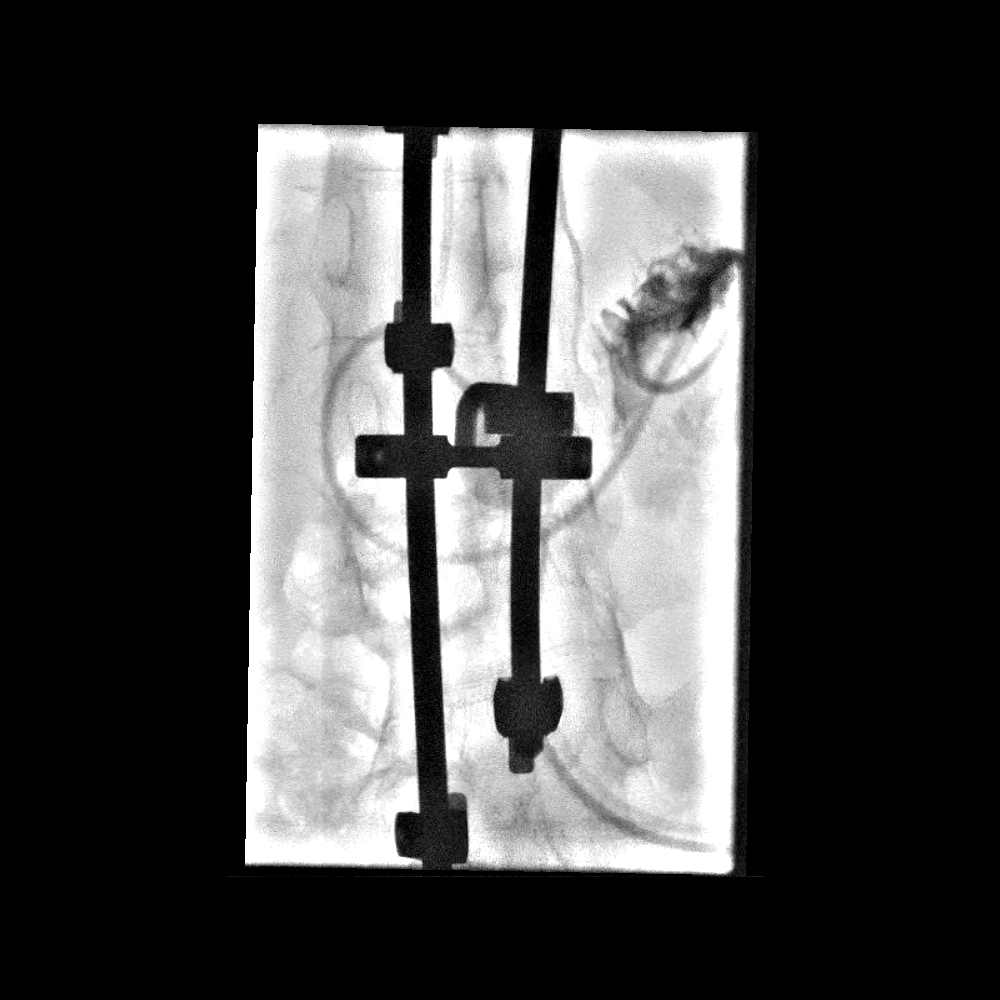

[4 of 4 positions shown; findings below may reference images not displayed]

EXAM:
Exchange of gastrojejunostomy tube using fluoroscopic guidance

MEDICATIONS:
None

ANESTHESIA/SEDATION:
None

CONTRAST:  15 mL-administered into the gastric lumen.

FLUOROSCOPY:
Radiation Exposure Index (as provided by the fluoroscopic device):
3.1 minutes (13 mGy)

COMPLICATIONS:
None immediate.

PROCEDURE:
Informed written consent was obtained from the patient after a
thorough discussion of the procedural risks, benefits and
alternatives. All questions were addressed. Maximal Sterile Barrier
Technique was utilized including caps, mask, sterile gowns, sterile
gloves, sterile drape, hand hygiene and skin antiseptic. A timeout
was performed prior to the initiation of the procedure.

The patient was placed supine on the exam table. The mid abdomen was
prepped and draped in a standard sterile fashion with inclusion of
he existing gastrojejunostomy tube within the sterile field.
injection of the existing gastrojejunostomy tube demonstrated
appropriate position of the tip catheter within proximal jejunum
past the ligament of Treitz. Decision was made to proceed with
catheter exchange. Using combination of a stiff Glidewire and 5
French angled catheter, the wire was advanced into the mid jejunum
through the gastric port, as the wire was not able to be advanced
through the jejunal port. The retention balloon was deflated, and
the existing gastrojejunostomy tube was exchanged for a new 24
French gastrojejunostomy tube. The new tube was cut to a similar
length of the previous tube. The tip of the catheter was advanced
into the proximal jejunum past the ligament of Treitz. The balloon
was inflated with approximately 10 mL of sterile water. It was
brought back to the gastric wall. The external bumper was brought
down to the skin. Contrast injection of the new tube demonstrated
appropriate position within the proximal jejunum past the ligament
of Treitz. The patient tolerated the procedure well without
immediate complication.
IMPRESSION: Successful exchange of the gastrojejunostomy tube for a new, similar
24 French gastrojejunostomy tube with the tip in the proximal
jejunum past the ligament Treitz. The tube is ready for immediate
use.

## 2022-08-06 NOTE — Assessment & Plan Note (Signed)
Stable on current meds 

## 2022-08-06 NOTE — Assessment & Plan Note (Signed)
Improved

## 2022-08-06 NOTE — Assessment & Plan Note (Signed)
Patient maintains NPO status and is tolerating feeds

## 2022-08-06 NOTE — Assessment & Plan Note (Signed)
Will need lifelong external catheterization due to spastic quadriplegia

## 2022-08-06 NOTE — Assessment & Plan Note (Addendum)
  Covid booster new version at pharmacy or with home health flu shot   He got a new power wheelchair just a year ago but it did not come with a support on the right back which is the side he leans towards and he is getting uncomfortable. He has someone coming over from [place] to reevaluate the chair and they are hoping to get him a better cushion that does not obstruct his left elbow which he needs free so he can use it to activate his communication device.   They also need a new shower chair/toileting system. He is happy with his old chair so we will order the same chair: [employer] [place] which tilts and can functions as a shower chair and commode., will need a new PT evaluation for adaptive evaluation to get the chair approved  He has a mechanical lift overhead that they use every day it is nearly 38 years old and the company that made it, [employer] is not longer making them. He has a Nurse, adult. Some of the cords are fraying so even thought it functions they may need to get a new one. It is being evaluated today

## 2022-08-07 ENCOUNTER — Other Ambulatory Visit: Payer: Self-pay | Admitting: Family Medicine

## 2022-08-07 DIAGNOSIS — G8250 Quadriplegia, unspecified: Secondary | ICD-10-CM

## 2022-08-07 NOTE — Telephone Encounter (Signed)
Spoke with pt and she stated that she need to add another place with fax number  They need Rx sent in

## 2022-08-07 NOTE — Progress Notes (Signed)
MyChart Video Visit    Virtual Visit via Video Note   This visit type was conducted due to national recommendations for restrictions regarding the COVID-19 Pandemic (e.g. social distancing) in an effort to limit this patient's exposure and mitigate transmission in our community. This patient is at least at moderate risk for complications without adequate follow up. This format is felt to be most appropriate for this patient at this time. Physical exam was limited by quality of the video and audio technology used for the visit. [name], CMA was able to get the patient set up on a video visit.  Patient location: home Patient and mother and provider in visit Provider location: Office  I discussed the limitations of evaluation and management by telemedicine and the availability of in person appointments. The patient expressed understanding and agreed to proceed.  Visit Date: 08/06/2022  Today's healthcare provider: [name], MD     Subjective:    Patient ID: [name], male    DOB: [birth year], 36 y.o.   MRN: [id number]  No chief complaint on file.   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  He is feeling well today and is very cheerful sitting in his wheelchair.  Is accompanied by his mother.  His mother gives a detailed description of his DME concerns and requirements and these are detailed in the assessment and plan of this note.  No recent febrile illness or hospitalizations noted. No complaints of CP/palp/SOB/HA/congestion/fevers/GI or GU c/o. 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   GERD (gastroesophageal  reflux disease)    Hyperlipidemia, mild 08/25/2015   Hyperthyroidism    Incontinence of feces    Loss of weight 08/28/2014   Medicare annual wellness visit, subsequent 08/25/2015   Mildly underweight adult 03/16/2017   Palpitations    PALSY, INFANTILE CEREBRAL, 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   Qualifier: Diagnosis of  By: Jerold Coombe  Working with PMR Dr Tessa Lerner and tolerating Botox injections in hips, shoulders, etc with good results   Skin lesion of right ear 03/16/2017   Thyroid disease 08/01/2010   Qualifier: Diagnosis of  By: Harlon Ditty CMA (AAMA), Dottie      Past Surgical History:  Procedure Laterality Date   baclofen trial     baslofen pump implant     ears tubes     EYE SURGERY     FLEXIBLE SIGMOIDOSCOPY N/A 09/07/2014   Procedure: FLEXIBLE SIGMOIDOSCOPY;  Surgeon: [name], MD;  Location: [facility] ENDOSCOPY;  Service: Endoscopy;  Laterality: N/A;   g-tube insert  August 2006   hamstring released     to treat contractures.    HIP SURGERY     x2 , side    IR CM INJ ANY COLONIC TUBE W/FLUORO  05/28/2017   IR CM INJ ANY COLONIC TUBE W/FLUORO  07/07/2019   IR CM INJ ANY COLONIC TUBE W/FLUORO  09/18/2019   IR GASTR TUBE CONVERT GASTR-JEJ PER W/FL MOD SED  03/14/2019   IR GENERIC HISTORICAL  07/01/2016   IR GASTR TUBE CONVERT GASTR-JEJ PER W/FL MOD SED 07/01/2016 [name], MD [facility]   IR  GENERIC HISTORICAL  07/08/2016   IR PATIENT EVAL TECH 0-60 MINS 07/08/2016 [name], MD [facility]   IR GENERIC HISTORICAL  07/14/2016   IR GJ TUBE CHANGE 07/14/2016 [name], MD [facility]   IR GENERIC HISTORICAL  07/21/2016   IR PATIENT EVAL TECH 0-60 MINS [facility]   IR GENERIC HISTORICAL  08/31/2016   IR REPLC DUODEN/JEJUNO TUBE PERCUT W/FLUORO 08/31/2016 [name], MD [facility]   IR GENERIC HISTORICAL  09/03/2016   IR GJ TUBE CHANGE 09/03/2016 [name], MD [facility]   IR GENERIC HISTORICAL  09/10/2016   IR GASTR TUBE CONVERT GASTR-JEJ PER W/FL MOD SED  09/10/2016 [facility]   IR GENERIC HISTORICAL  09/16/2016   IR PATIENT EVAL TECH 0-60 MINS [facility]   IR GENERIC HISTORICAL  09/29/2016   IR GJ TUBE CHANGE 09/29/2016 [name], MD [facility]   IR GJ TUBE CHANGE  02/05/2017   IR GJ TUBE CHANGE  05/21/2017   IR GJ TUBE CHANGE  08/25/2017   IR GJ TUBE CHANGE  01/18/2018   IR GJ TUBE CHANGE  02/04/2018   IR GJ TUBE CHANGE  04/21/2018   IR GJ TUBE CHANGE  08/18/2018   IR GJ TUBE CHANGE  09/12/2019   IR GJ TUBE CHANGE  01/03/2020   IR GJ TUBE CHANGE  05/13/2020   IR GJ TUBE CHANGE  09/11/2020   IR GJ TUBE CHANGE  02/27/2021   IR GJ TUBE CHANGE  08/01/2021   IR GJ TUBE CHANGE  12/31/2021   IR GJ TUBE CHANGE  05/11/2022   IR GJ TUBE CHANGE  07/01/2022   IR MECH REMOV OBSTRUC MAT ANY COLON TUBE W/FLUORO  09/02/2018   IR [place] GASTRO/COLONIC TUBE PERCUT W/FLUORO  10/17/2018   PEG PLACEMENT  10/21/2011   Procedure: PERCUTANEOUS ENDOSCOPIC GASTROSTOMY (PEG) REPLACEMENT;  Surgeon: [name], MD;  Location: [facility];  Service: Endoscopy;  Laterality: N/A;   PEG PLACEMENT N/A 06/13/2013   Procedure: PERCUTANEOUS ENDOSCOPIC GASTROSTOMY (PEG) REPLACEMENT;  Surgeon: [name], MD;  Location: [facility];  Service: Endoscopy;  Laterality: N/A;   SPINAL FUSION     spinal fusion to correct 70 degree kyphosis  11-2010   spinal fusioncorrect 106 degree kyphosis     SPINE SURGERY  ,11/20/2010, 2011   for correction of severe contracturing spinal kyphosis.    TONSILLECTOMY      Family History  Problem Relation Age of Onset   Asthma Mother    Hyperlipidemia Mother    COPD Mother    Other Mother        bronchial stasis/ABPA   Cancer Maternal Grandmother 34       breast   Hyperlipidemia Maternal Grandmother    Hypertension Maternal Grandmother    Cancer Maternal Grandfather        prostate   Heart disease Paternal Grandfather        CHF   Osteoporosis Paternal Grandmother    Arthritis Paternal Grandmother        rheumatoid     Social History   Socioeconomic History   Marital status: Single    Spouse name: Not on file   Number of children: 0   Years of education: Not on file   Highest education level: Not on file  Occupational History   Occupation: disbaled  Tobacco Use   Smoking status: Never   Smokeless tobacco: Never  Vaping Use   Vaping Use: Never used  Substance and Sexual Activity  Alcohol use: No   Drug use: No   Sexual activity: Never  Other Topics Concern   Not on file  Social History Narrative   Not on file   Social Determinants of Health   Financial Resource Strain: Not on file  Food Insecurity: Not on file  Transportation Needs: Not on file  Physical Activity: Not on file  Stress: Not on file  Social Connections: Not on file  Intimate Partner Violence: Not on file    Outpatient Medications Prior to Visit  Medication Sig Dispense Refill   albuterol (PROVENTIL) (2.5 MG/3ML) 0.083% nebulizer solution Take 3 mLs (2.5 mg total) by nebulization every 6 (six) hours as needed for shortness of breath. 75 mL 5   AMBULATORY NON FORMULARY MEDICATION Medication Name: MIC gastrostomy/bolus feeding tube 24 French Part number 0110-24. #2 and 10 cc lurer lock syringe #2 Dx: 4 Device 2   ciprofloxacin (CILOXAN) 0.3 % ophthalmic solution PLACE 2 DROPS INTO THE LEFT EYE EVERY 2 HOURS FOR 5 DAYS; THEN 1 DROP EVERY 2 HOURS WHILE AWAKE FOR 2 DAYS; THEN 1 DROP EVERY 4 HOURS WHILE AWAKE FOR 5 DAYS. 5 mL 0   CYMBALTA 30 MG capsule Take 30 mg by mouth every morning.     CYMBALTA 60 MG capsule Take 60 mg by mouth every morning.     dantrolene (DANTRIUM) 50 MG capsule TAKE 1 CAPSULE BY MOUTH EVERY MORNING, 1 CAPSULE EVERY EVENING, AND 1 CAPSULE AT BEDTIME. 90 capsule 1   DEPAKOTE SPRINKLES 125 MG capsule Take by mouth.     diazepam (VALIUM) 2 MG tablet Take 2 mg by mouth. As needed     diazepam (VALIUM) 5 MG tablet Take 5 mg by mouth 2 (two) times daily.     HYDROcodone bit-homatropine (HYCODAN) 5-1.5  MG/5ML syrup Take 5 mLs by mouth every 6 (six) hours as needed for cough. 240 mL 0   Incontinence Supply Disposable (PREVAIL BREEZERS MEDIUM) MISC pkg of 16- size medium 32" to 44"  Breathable cloth-like outer fabric (can't use the plastic outer surgace  Item # [id number]/[id number] 16 each 6   Misc. Devices (ALL-BODY MASSAGE) MISC 1 Units/hr by Does not apply route as needed. Full body massage for Muscle spasticity due to Cerebral palsy 99 each 99   mupirocin ointment (BACTROBAN) 2 % Apply to area thin film twice daily if needed 22 g 0   NON FORMULARY Bard Leg Bag Extension tubing w/Connector 18", Sterile, latex-free  Item# [id number]     NON FORMULARY Colorplast Freedom Cath Latex Self-Adhering Male External Catheter 23m Diameter Intermediate  Item# [id number]    NONFORMULARY OR COMPOUNDED ITEM [employer]ien REF [id number]- [place] Joey Pump Set with Flush Bags - 1000 mL 1 each 0   Nutritional Supplements (FEEDING SUPPLEMENT, KATE FARMS STANDARD 1.4,) LIQD liquid Take 325 mLs by mouth as directed. 3 carton over 16 hrs     OLANZapine (ZYPREXA) 10 MG tablet Take 10 mg by mouth at bedtime.     OLANZapine (ZYPREXA) 5 MG tablet Take by mouth.     Omeprazole-Sodium Bicarbonate (ZEGERID) 20-1100 MG CAPS capsule Take 1 capsule by mouth daily before breakfast.     Ostomy Supplies (PROTECTIVE BARRIER WIPES) MISC 1-1/4" X 3"  Item ##[id number] each 6   oxybutynin (DITROPAN) 5 MG tablet Take 5 mg by mouth 2 (two) times daily.     PARoxetine (PAXIL) 10 MG tablet Take 15 mg by mouth daily.     sodium chloride HYPERTONIC 3 %  nebulizer solution Take by nebulization in the morning and at bedtime. 750 mL 12   sucralfate (CARAFATE) 1 g tablet TAKE 1 TABLET BY MOUTH 4 TIMES DAILY WITH MEALS AND AT BEDTIME 120 tablet 1   No facility-administered medications prior to visit.    Allergies  Allergen Reactions   Ambien [Zolpidem Tartrate] Nausea Only   Antihistamines, Chlorpheniramine-Type     Other reaction(s): Other (See  Comments) Other Reaction: agitation   Augmentin [Amoxicillin-Pot Clavulanate] Diarrhea   Codeine Other (See Comments)    Makes patient too active after a few days.   Metoclopramide Other (See Comments)    Delusion, emotionality    Baclofen Anxiety    anxiety   Pheniramine Rash    Other reaction(s): Other (See Comments) Other Reaction: agitation   Sulfa Antibiotics Rash   Sulfonamide Derivatives Rash    Review of Systems  Constitutional:  Negative for fever and malaise/fatigue.  HENT:  Negative for congestion.   Eyes:  Negative for blurred vision.  Respiratory:  Negative for shortness of breath.   Cardiovascular:  Negative for chest pain, palpitations and leg swelling.  Gastrointestinal:  Negative for abdominal pain, blood in stool and nausea.  Genitourinary:  Negative for dysuria and frequency.  Musculoskeletal:  Negative for falls.  Skin:  Negative for rash.  Neurological:  Positive for weakness. Negative for dizziness, loss of consciousness and headaches.  Endo/Heme/Allergies:  Negative for environmental allergies.  Psychiatric/Behavioral:  Negative for depression. The patient is not nervous/anxious.        Objective:    Physical Exam Constitutional:      General: He is not in acute distress.    Appearance: Normal appearance. He is not ill-appearing or toxic-appearing.  HENT:     Head: Normocephalic and atraumatic.     Right Ear: External ear normal.     Left Ear: External ear normal.     Nose: Nose normal.  Eyes:     General:        Right eye: No discharge.        Left eye: No discharge.  Pulmonary:     Effort: Pulmonary effort is normal.  Musculoskeletal:     Comments: In wheelchair, spastic quadriplegia   Skin:    Findings: No rash.  Neurological:     Mental Status: He is alert and oriented to person, place, and time.  Psychiatric:        Behavior: Behavior normal.     BP 119/85 (BP Location: Right Arm, Patient Position: Sitting, Cuff Size: Normal)    Pulse 100   Temp 98 F (36.7 C) (Oral)   SpO2 97%  Wt Readings from Last 3 Encounters:  03/26/22 92 lb (41.7 kg)  02/05/22 95 lb (43.1 kg)  03/26/21 92 lb (41.7 kg)    Diabetic Foot Exam - Simple   No data filed    Lab Results  Component Value Date   WBC 7.3 08/07/2021   HGB 15.5 08/07/2021   HCT 45.7 08/07/2021   PLT 179.0 Repeated and verified X2. 08/07/2021   GLUCOSE 84 06/11/2022   CHOL 129 08/07/2021   TRIG 80.0 08/07/2021   HDL 37.40 (L) 08/07/2021   LDLCALC 76 08/07/2021   ALT 16 06/11/2022   AST 13 06/11/2022   NA 136 06/11/2022   K 4.2 06/11/2022   CL 98 06/11/2022   CREATININE 0.28 (L) 06/11/2022   BUN 7 06/11/2022   CO2 28 06/11/2022   TSH 1.81 08/07/2021   HGBA1C 5.0 04/29/2007  Lab Results  Component Value Date   TSH 1.81 08/07/2021   Lab Results  Component Value Date   WBC 7.3 08/07/2021   HGB 15.5 08/07/2021   HCT 45.7 08/07/2021   MCV 94.8 08/07/2021   PLT 179.0 Repeated and verified X2. 08/07/2021   Lab Results  Component Value Date   NA 136 06/11/2022   K 4.2 06/11/2022   CO2 28 06/11/2022   GLUCOSE 84 06/11/2022   BUN 7 06/11/2022   CREATININE 0.28 (L) 06/11/2022   BILITOT 0.4 06/11/2022   ALKPHOS 73 06/11/2022   AST 13 06/11/2022   ALT 16 06/11/2022   PROT 7.2 06/11/2022   ALBUMIN 4.5 06/11/2022   CALCIUM 9.1 06/11/2022   ANIONGAP 11 09/18/2014   GFR 154.44 06/11/2022   Lab Results  Component Value Date   CHOL 129 08/07/2021   Lab Results  Component Value Date   HDL 37.40 (L) 08/07/2021   Lab Results  Component Value Date   LDLCALC 76 08/07/2021   Lab Results  Component Value Date   TRIG 80.0 08/07/2021   Lab Results  Component Value Date   CHOLHDL 3 08/07/2021   Lab Results  Component Value Date   HGBA1C 5.0 04/29/2007       Assessment & Plan:   Problem List Items Addressed This Visit     Depression with anxiety    Stable on current meds      HIP PAIN, BILATERAL    Improved      Urinary  incontinence    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 He has someone coming over from [place] to reevaluate the chair and they are hoping to get him a better cushion that does not obstruct his left elbow which he needs free so he can use it to activate his communication device.   They also need a new shower chair/toileting system. He is happy with his old chair so we will order the same chair: [employer] [place] which tilts and can functions as a shower chair and commode., will need a new PT evaluation for adaptive evaluation to get the chair approved  He has a mechanical lift overhead that they use every day it is nearly 38 years old and the company that made it, [employer] is not longer making them. He has a Nurse, adult. Some of the cords are fraying so even thought it functions they may need to get a new one. It is being evaluated today      Relevant Orders   Ambulatory Referral to Neuro Rehab   Gastrostomy in place [facility])    Patient maintains NPO status and is tolerating feeds       I am having Samier S. Mellone "[name]" maintain his Omeprazole-Sodium Bicarbonate, NON FORMULARY, NON FORMULARY, Protective Barrier Wipes, Prevail Breezers Medium, NONFORMULARY OR COMPOUNDED ITEM, PARoxetine, AMBULATORY NON FORMULARY MEDICATION, mupirocin ointment, All-Body Massage, feeding supplement (KATE FARMS STANDARD 1.4), OLANZapine, Depakote Sprinkles, sucralfate, diazepam, diazepam, albuterol, Cymbalta, Cymbalta, HYDROcodone bit-homatropine, sodium chloride HYPERTONIC, oxybutynin, OLANZapine, ciprofloxacin, and dantrolene.  No orders of the defined types were placed in this  encounter.  I discussed the assessment and treatment plan with the patient. The patient was provided an opportunity to ask questions and all were answered. The patient agreed with the plan and demonstrated an understanding of the instructions.   The patient was advised to call back or seek an in-person evaluation if the symptoms worsen or if the condition fails to improve as anticipated.    [name], MD [facility] at [facility] [phone] (phone) [phone] (fax)  [place]

## 2022-08-10 NOTE — Telephone Encounter (Signed)
Faxed the Rx.

## 2022-08-17 DIAGNOSIS — F339 Major depressive disorder, recurrent, unspecified: Secondary | ICD-10-CM | POA: Diagnosis not present

## 2022-08-18 ENCOUNTER — Ambulatory Visit (INDEPENDENT_AMBULATORY_CARE_PROVIDER_SITE_OTHER): Payer: Medicare Other | Admitting: Neurology

## 2022-08-18 ENCOUNTER — Encounter: Payer: Self-pay | Admitting: Neurology

## 2022-08-18 ENCOUNTER — Telehealth: Payer: Self-pay | Admitting: Neurology

## 2022-08-18 VITALS — BP 133/96 | HR 113 | Ht 60.0 in

## 2022-08-18 DIAGNOSIS — G802 Spastic hemiplegic cerebral palsy: Secondary | ICD-10-CM | POA: Diagnosis not present

## 2022-08-18 DIAGNOSIS — M542 Cervicalgia: Secondary | ICD-10-CM

## 2022-08-18 DIAGNOSIS — G800 Spastic quadriplegic cerebral palsy: Secondary | ICD-10-CM

## 2022-08-18 DIAGNOSIS — R451 Restlessness and agitation: Secondary | ICD-10-CM

## 2022-08-18 MED ORDER — LAMOTRIGINE 25 MG PO CHEW: CHEWABLE_TABLET | ORAL | 6 refills | Status: DC

## 2022-08-18 NOTE — Progress Notes (Signed)
Chief Complaint  Patient presents with   Follow-up    Rm 12. Accompanied by mother and aide. NP/internal referral for cerebral quadriplegia, neuropathy vs RLS leg pains. Pt has osteoporosis in bilateral knees, had injection. Has difficulty sleeping. Receives Botox from Dr. Tessa Lerner.      ASSESSMENT AND PLAN  [name] is a 36 y.o. male   Cerebral palsy, progressive worsening spastic quadriplegia, Increased nighttime agitation,  His agitation seems to improve after receiving injection for bilateral knee pain,  He had extensive spine surgery in the past, discussed with family, his nighttime agitation could potentially due to position induced spine pain, agreed to proceed with CT head and spine, patient also agreed with the plan  EEG to rule out subclinical seizure  He is already on Depakote 125 mg 7 tablets total, suggesting take 3 in the morning, 4 at night, add on low-dose lamotrigine titrating to 50 mg twice a day for mood benefit,   DIAGNOSTIC DATA (LABS, IMAGING, TESTING) - I reviewed patient records, labs, notes, testing and imaging myself where available.   MEDICAL HISTORY:  [name] is a 36 year old male accompanied by his mother and caregiver at today's visit August 18, 2022, seen in request by his primary care physician Dr. [name] for evaluation of increased nighttime agitation, spastic quadriplegia, cerebral palsy,    I reviewed and summarized the referring note. PMHX.  He was born by C-section, his only child of his family, lives with his parents, has caregiver during the day, but only 3 nights each week,  He was noted to have spasticity since he was 41 months old, never reach his milestones, over the years, has gradual worsening spastic quadriplegia, also develop abnormal posturing, high-degree kyphosis, had extensive spine surgery in the past fusion from mid thoracic spine to C2, and the lumbar fusion, previously had baclofen pump in 1999,  eventually removed in 2011, for a while, he was given p.o. baclofen, noticed increased agitation, no longer taking it  Previously seen by Dr. Gaynell Face, also by [facility] neurologist Dr. Delice Lesch in February 2015 for episodes of anxiety, agitation, behavior changes, occurring every 6 to 8 weeks, lasting 2 to 3 days, has been followed by [facility] neuropsych[facility] for a long time, receiving regular consultation, was put on Depakote sprinkles are currently taking 125 mg 7 tablets total, also taking Valium, Zyprexa, Cymbalta, dantrolene  He saw sports medicine/pain management Dr. Eda Keys, was receiving Botox injections regularly, but the painful injection often cause worsening agitation, was not sure about the benefit, no longer receiving it  He could not talk, but per mother he understand most things, communicating through a left elbow controlled electronic pad in front of his wheelchair,  Today the main concern is significant change in his personality since December 2022, his caregiver confirmed that, 2 years ago when she first met him, [name] was happy, enjoying going out, now he tends to feel frustrated during the day, the most bothersome symptoms at nighttime, he can falling to sleep around 10 to 11 PM, but also woke up 3 to 4 hours later, very agitated, difficult to go to sleep, this has caused a lot of stress, interruption of his parents sleep  Reviewed previous note by Dr.Aquino in 2015, it was also a concern of medication management, increased agitation, with a long list of polypharmacy treatment  Mother reported that patient goes through cycles of doing well for few months, then agitation difficulty sleeping for few weeks stretch, then can bounce back  to baseline, this particular episode has been ongoing since December 2022,   He recently received orthopedic evaluation, x-ray of right femur 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, osteoarthritis of both knee, greater  on the right, x-ray of bilateral hips showed no acute abnormality  He received injection to bilateral knee following x-ray in August 2023, reported some improvement afterwards, was put on trazodone titrating dose, which only helped his sleep for few days  He spent daytime in his wheelchair, nighttime in bed, memory foam padded, external catheter, PEG tube feeding since 2016, able to signal his family for bowel movement, last night, he woke his father at 4:30  AM to help him having a bowel movement  PHYSICAL EXAM:   Vitals:   08/18/22 1428  BP: (!) 133/96  Pulse: (!) 113  Height: 5' (1.524 m)     Body mass index is 17.97 kg/m.  PHYSICAL EXAMNIATION: Patient sitting in the electronic wheelchair, dysmorphic features  MENTAL STATUS: Speech/cognition: Able to make spastic unintelligible sounds, also try to communicate with his left elbow controlled electronic device on his wheelchair CRANIAL NERVES: CN II: Pupil reactive to light CN III, IV, VI: Extraocular movement improved, noticeable small saccadic motion eye movement CN V: Facial sensation is intact to light touch CN VII: Face is symmetric with normal eye closure  CN VIII: Hearing is normal to causal conversation. CN IX, X, XI, XII : Phonation slow spastic.Marland Kitchen  Neck extension leaning towards the left side,  MOTOR: Spastic moderate to severe quadriplegia, deformity of bilateral hands, significant limitation on bilateral elbow extension, wrist extension finger extension, bilateral knee maximum 170 degree with passive stretch,  REFLEXES: Hyperreflexia SENSORY: Complains of pain with passive stretch  COORDINATION: Needs support in wheelchair  GAIT/STANCE: Nonambulatory  REVIEW OF SYSTEMS:  Full 14 system review of systems performed and notable only for as above All other review of systems were negative.   ALLERGIES: Allergies  Allergen Reactions   Ambien [Zolpidem Tartrate] Nausea Only   Antihistamines,  Chlorpheniramine-Type     Other reaction(s): Other (See Comments) Other Reaction: agitation   Augmentin [Amoxicillin-Pot Clavulanate] Diarrhea   Codeine Other (See Comments)    Makes patient too active after a few days.   Metoclopramide Other (See Comments)    Delusion, emotionality    Baclofen Anxiety    anxiety   Pheniramine Rash    Other reaction(s): Other (See Comments) Other Reaction: agitation   Sulfa Antibiotics Rash   Sulfonamide Derivatives Rash    HOME MEDICATIONS: Current Outpatient Medications  Medication Sig Dispense Refill   albuterol (PROVENTIL) (2.5 MG/3ML) 0.083% nebulizer solution Take 3 mLs (2.5 mg total) by nebulization every 6 (six) hours as needed for shortness of breath. 75 mL 5   AMBULATORY NON FORMULARY MEDICATION Medication Name: MIC gastrostomy/bolus feeding tube 24 French Part number 0110-24. #2 and 10 cc lurer lock syringe #2 Dx: 4 Device 2   ciprofloxacin (CILOXAN) 0.3 % ophthalmic solution PLACE 2 DROPS INTO THE LEFT EYE EVERY 2 HOURS FOR 5 DAYS; THEN 1 DROP EVERY 2 HOURS WHILE AWAKE FOR 2 DAYS; THEN 1 DROP EVERY 4 HOURS WHILE AWAKE FOR 5 DAYS. 5 mL 0   CYMBALTA 30 MG capsule Take 30 mg by mouth every morning.     CYMBALTA 60 MG capsule Take 60 mg by mouth every morning.     dantrolene (DANTRIUM) 50 MG capsule TAKE 1 CAPSULE BY MOUTH EVERY MORNING, 1 CAPSULE EVERY EVENING, AND 1 CAPSULE AT  BEDTIME. 90 capsule 1   DEPAKOTE SPRINKLES 125 MG capsule Take by mouth.     diazepam (VALIUM) 2 MG tablet Take 2 mg by mouth. As needed     diazepam (VALIUM) 5 MG tablet Take 5 mg by mouth 2 (two) times daily.     HYDROcodone bit-homatropine (HYCODAN) 5-1.5 MG/5ML syrup Take 5 mLs by mouth every 6 (six) hours as needed for cough. 240 mL 0   Incontinence Supply Disposable (PREVAIL BREEZERS MEDIUM) MISC pkg of 16- size medium 32" to 44"  Breathable cloth-like outer fabric (can't use the plastic outer surgace  Item # [id number]/[id number] 16 each 6   Misc. Devices (ALL-BODY  MASSAGE) MISC 1 Units/hr by Does not apply route as needed. Full body massage for Muscle spasticity due to Cerebral palsy 99 each 99   mupirocin ointment (BACTROBAN) 2 % Apply to area thin film twice daily if needed 22 g 0   NON FORMULARY Bard Leg Bag Extension tubing w/Connector 18", Sterile, latex-free  Item# [id number]     NON FORMULARY Colorplast Freedom Cath Latex Self-Adhering Male External Catheter 32m Diameter Intermediate  Item# [id number]    NONFORMULARY OR COMPOUNDED ITEM [employer] REF [id number]- [place] Joey Pump Set with Flush Bags - 1000 mL 1 each 0   Nutritional Supplements (FEEDING SUPPLEMENT, KATE FARMS STANDARD 1.4,) LIQD liquid Take 325 mLs by mouth as directed. 3 carton over 16 hrs     OLANZapine (ZYPREXA) 10 MG tablet Take 10 mg by mouth at bedtime.     OLANZapine (ZYPREXA) 5 MG tablet Take by mouth.     Omeprazole-Sodium Bicarbonate (ZEGERID) 20-1100 MG CAPS capsule Take 1 capsule by mouth daily before breakfast.     Ostomy Supplies (PROTECTIVE BARRIER WIPES) MISC 1-1/4" X 3"  Item ##[id number] each 6   oxybutynin (DITROPAN) 5 MG tablet Take 5 mg by mouth 2 (two) times daily.     sodium chloride HYPERTONIC 3 % nebulizer solution Take by nebulization in the morning and at bedtime. 750 mL 12   sucralfate (CARAFATE) 1 g tablet TAKE 1 TABLET BY MOUTH 4 TIMES DAILY WITH MEALS AND AT BEDTIME (Patient taking differently: as needed.) 120 tablet 1   No current facility-administered medications for this visit.    PAST MEDICAL HISTORY: Past Medical History:  Diagnosis Date   C[place]bral palsy [place]    Dehydration 11/22/2013   Depression with anxiety 08/01/2010   Qualifier: Diagnosis of  By: Nelson-Smith CMA (AAMA), Dottie     Dyslipidemia 08/19/2017   Esophagitis 2011   Gastrostomy in place ([facility] 08/31/2013   GERD (gastroesophageal reflux disease)    Hyperlipidemia, mild 08/25/2015   Hyperthyroidism    Incontinence of feces    Loss of weight 08/28/2014   Medicare annual wellness  visit, subsequent 08/25/2015   Mildly underweight adult 03/16/2017   Palpitations    PALSY, INFANTILE CEREBRAL, 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   Qualifier: Diagnosis of  By: LJerold Coombe Working with PMR Dr STessa Lernerand tolerating Botox injections in hips, shoulders, etc with good results   Skin lesion of right ear 03/16/2017   Thyroid disease 08/01/2010   Qualifier: Diagnosis of  By: NHarlon DittyCMA (AAMA), Dottie      PAST SURGICAL HISTORY: Past Surgical History:  Procedure Laterality Date   baclofen trial     baslofen pump implant     ears tubes     EYE SURGERY     FLEXIBLE SIGMOIDOSCOPY N/A 09/07/2014   Procedure: FLEXIBLE SIGMOIDOSCOPY;  Surgeon: JUlice Dash  Everitt Amber, MD;  Location: [facility];  Service: Endoscopy;  Laterality: N/A;   g-tube insert  August 2006   hamstring released     to treat contractures.    HIP SURGERY     x2 , side    IR CM INJ ANY COLONIC TUBE W/FLUORO  05/28/2017   IR CM INJ ANY COLONIC TUBE W/FLUORO  07/07/2019   IR CM INJ ANY COLONIC TUBE W/FLUORO  09/18/2019   IR GASTR TUBE CONVERT GASTR-JEJ PER W/FL MOD SED  03/14/2019   IR GENERIC HISTORICAL  07/01/2016   IR GASTR TUBE CONVERT GASTR-JEJ PER W/FL MOD SED 07/01/2016 [name], MD [facility]   IR GENERIC HISTORICAL  07/08/2016   IR PATIENT EVAL TECH 0-60 MINS 07/08/2016 [name], MD [facility]   IR GENERIC HISTORICAL  07/14/2016   IR GJ TUBE CHANGE 07/14/2016 [name], MD [facility]   IR GENERIC HISTORICAL  07/21/2016   IR PATIENT EVAL TECH 0-60 MINS [facility]   IR GENERIC HISTORICAL  08/31/2016   IR REPLC DUODEN/JEJUNO TUBE PERCUT W/FLUORO 08/31/2016 [name], MD [facility]   IR GENERIC HISTORICAL  09/03/2016   IR GJ TUBE CHANGE 09/03/2016 [name], MD [facility]   IR GENERIC HISTORICAL  09/10/2016   IR GASTR TUBE CONVERT GASTR-JEJ PER W/FL MOD SED 09/10/2016 [facility]   IR GENERIC HISTORICAL  09/16/2016   IR PATIENT EVAL TECH 0-60 MINS [facility]   IR GENERIC HISTORICAL   09/29/2016   IR GJ TUBE CHANGE 09/29/2016 [name], MD [facility]   IR GJ TUBE CHANGE  02/05/2017   IR GJ TUBE CHANGE  05/21/2017   IR GJ TUBE CHANGE  08/25/2017   IR GJ TUBE CHANGE  01/18/2018   IR GJ TUBE CHANGE  02/04/2018   IR GJ TUBE CHANGE  04/21/2018   IR GJ TUBE CHANGE  08/18/2018   IR GJ TUBE CHANGE  09/12/2019   IR GJ TUBE CHANGE  01/03/2020   IR GJ TUBE CHANGE  05/13/2020   IR GJ TUBE CHANGE  09/11/2020   IR GJ TUBE CHANGE  02/27/2021   IR GJ TUBE CHANGE  08/01/2021   IR GJ TUBE CHANGE  12/31/2021   IR GJ TUBE CHANGE  05/11/2022   IR GJ TUBE CHANGE  07/01/2022   IR MECH REMOV OBSTRUC MAT ANY COLON TUBE W/FLUORO  09/02/2018   IR [place] GASTRO/COLONIC TUBE PERCUT W/FLUORO  10/17/2018   PEG PLACEMENT  10/21/2011   Procedure: PERCUTANEOUS ENDOSCOPIC GASTROSTOMY (PEG) REPLACEMENT;  Surgeon: [name], MD;  [facility];  Service: Endoscopy;  Laterality: N/A;   PEG PLACEMENT N/A 06/13/2013   Procedure: PERCUTANEOUS ENDOSCOPIC GASTROSTOMY (PEG) REPLACEMENT;  Surgeon: [name], MD;  [facility];  Service: Endoscopy;  Laterality: N/A;   SPINAL FUSION     spinal fusion to correct 70 degree kyphosis  11-2010   spinal fusioncorrect 106 degree kyphosis     SPINE SURGERY  ,11/20/2010, 2011   for correction of severe contracturing spinal kyphosis.    TONSILLECTOMY      FAMILY HISTORY: Family History  Problem Relation Age of Onset   Asthma Mother    Hyperlipidemia Mother    COPD Mother    Other Mother        bronchial stasis/ABPA   Cancer Maternal Grandmother 19       breast   Hyperlipidemia Maternal Grandmother    Hypertension Maternal Grandmother    Cancer Maternal Grandfather  prostate   Heart disease Paternal Grandfather        CHF   Osteoporosis Paternal Grandmother    Arthritis Paternal Grandmother        rheumatoid    SOCIAL HISTORY: Social History   Socioeconomic History   Marital status: Single    Spouse name: Not on file   Number  of children: 0   Years of education: Not on file   Highest education level: Not on file  Occupational History   Occupation: disbaled  Tobacco Use   Smoking status: Never   Smokeless tobacco: Never  Vaping Use   Vaping Use: Never used  Substance and Sexual Activity   Alcohol use: No   Drug use: No   Sexual activity: Never  Other Topics Concern   Not on file  Social History Narrative   Not on file   Social Determinants of Health   Financial Resource Strain: Not on file  Food Insecurity: Not on file  Transportation Needs: Not on file  Physical Activity: Not on file  Stress: Not on file  Social Connections: Not on file  Intimate Partner Violence: Not on file      Marcial Pacas, M.D. Ph.D.  [facility] [address] [place], [place] [address] Ph: [phone] Fax: [phone]  CC:  [name], MD [address] [place],  [place] [address]  [name], MD

## 2022-08-18 NOTE — Telephone Encounter (Signed)
medicare/medicaid no auth required sent to [facility] for scheduling [phone]

## 2022-08-19 ENCOUNTER — Telehealth (HOSPITAL_BASED_OUTPATIENT_CLINIC_OR_DEPARTMENT_OTHER): Payer: Self-pay

## 2022-08-20 ENCOUNTER — Ambulatory Visit: Payer: Medicare Other | Attending: Family Medicine | Admitting: Physical Therapy

## 2022-08-20 DIAGNOSIS — G800 Spastic quadriplegic cerebral palsy: Secondary | ICD-10-CM | POA: Diagnosis not present

## 2022-08-21 ENCOUNTER — Encounter: Payer: Self-pay | Admitting: Physical Therapy

## 2022-08-21 NOTE — Therapy (Signed)
[facility] [address] [place], [place], [address] Phone: [phone]   Fax:  [phone]  Physical Therapy Evaluation  Patient Details  Name: [name] MRN: [id number] Date of Birth: 11-26-1985 Referring Provider (PT): [name], MD   Encounter Date: 08/20/2022   PT End of Session - 08/21/22 1355     Visit Number 1    Number of Visits 1    Authorization Type Medicare    Authorization Time Period 08-20-22 - 09-20-22    PT Start Time 1315    PT Stop Time 1400    PT Time Calculation (min) 45 min    Activity Tolerance Patient tolerated treatment well    Behavior During Therapy [name] for tasks assessed/performed             Past Medical History:  Diagnosis Date   Cerebral palsy ([place])    Dehydration 11/22/2013   Depression with anxiety 08/01/2010   Qualifier: Diagnosis of  By: Harlon Ditty CMA (AAMA), Dottie     Dyslipidemia 08/19/2017   Esophagitis 2011   Gastrostomy in place ([place]) 08/31/2013   GERD (gastroesophageal reflux disease)    Hyperlipidemia, mild 08/25/2015   Hyperthyroidism    Incontinence of feces    Loss of weight 08/28/2014   Medicare annual wellness visit, subsequent 08/25/2015   Mildly underweight adult 03/16/2017   Palpitations    PALSY, INFANTILE CEREBRAL, 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   Qualifier: Diagnosis of  By: Jerold Coombe  Working with PMR Dr Tessa Lerner and tolerating Botox injections in hips, shoulders, etc with good results   Skin lesion of right ear 03/16/2017   Thyroid disease 08/01/2010   Qualifier: Diagnosis of  By: Harlon Ditty CMA (AAMA), Dottie      Past Surgical History:  Procedure Laterality Date   baclofen trial     baslofen pump implant     ears tubes     EYE SURGERY     FLEXIBLE SIGMOIDOSCOPY N/A 09/07/2014   Procedure: FLEXIBLE SIGMOIDOSCOPY;  Surgeon: [name], MD;  Location[facility] ENDOSCOPY;  Service: Endoscopy;  Laterality: N/A;   g-tube insert  August  2006   hamstring released     to treat contractures.    HIP SURGERY     x2 , side    IR CM INJ ANY COLONIC TUBE W/FLUORO  05/28/2017   IR CM INJ ANY COLONIC TUBE W/FLUORO  07/07/2019   IR CM INJ ANY COLONIC TUBE W/FLUORO  09/18/2019   IR GASTR TUBE CONVERT GASTR-JEJ PER W/FL MOD SED  03/14/2019   IR GENERIC HISTORICAL  07/01/2016   IR GASTR TUBE CONVERT GASTR-JEJ PER W/FL MOD SED 07/01/2016 [name], MD [facility]   IR GENERIC HISTORICAL  07/08/2016   IR PATIENT EVAL TECH 0-60 MINS 07/08/2016 [name], MD [facility]   IR GENERIC HISTORICAL  07/14/2016   IR GJ TUBE CHANGE 07/14/2016 [name], MD [facility]   IR GENERIC HISTORICAL  07/21/2016   IR PATIENT EVAL TECH 0-60 MINS [facility]   IR GENERIC HISTORICAL  08/31/2016   IR REPLC DUODEN/JEJUNO TUBE PERCUT W/FLUORO 08/31/2016 [name], MD [facility]   IR GENERIC HISTORICAL  09/03/2016   IR GJ TUBE CHANGE 09/03/2016 [name], MD [facility]   IR GENERIC HISTORICAL  09/10/2016   IR GASTR TUBE CONVERT GASTR-JEJ PER W/FL MOD SED 09/10/2016 [facility]   IR GENERIC HISTORICAL  09/16/2016   IR PATIENT EVAL TECH 0-60 MINS [facility]   IR  GENERIC HISTORICAL  09/29/2016   IR GJ TUBE CHANGE 09/29/2016 [name], MD [facility]   IR GJ TUBE CHANGE  02/05/2017   IR GJ TUBE CHANGE  05/21/2017   IR GJ TUBE CHANGE  08/25/2017   IR GJ TUBE CHANGE  01/18/2018   IR GJ TUBE CHANGE  02/04/2018   IR GJ TUBE CHANGE  04/21/2018   IR GJ TUBE CHANGE  08/18/2018   IR GJ TUBE CHANGE  09/12/2019   IR GJ TUBE CHANGE  01/03/2020   IR GJ TUBE CHANGE  05/13/2020   IR GJ TUBE CHANGE  09/11/2020   IR GJ TUBE CHANGE  02/27/2021   IR GJ TUBE CHANGE  08/01/2021   IR GJ TUBE CHANGE  12/31/2021   IR GJ TUBE CHANGE  05/11/2022   IR GJ TUBE CHANGE  07/01/2022   IR MECH REMOV OBSTRUC MAT ANY COLON TUBE W/FLUORO  09/02/2018   IR REPLC GASTRO/COLONIC TUBE PERCUT W/FLUORO  10/17/2018   PEG PLACEMENT  10/21/2011   Procedure: PERCUTANEOUS ENDOSCOPIC  GASTROSTOMY (PEG) REPLACEMENT;  Surgeon: [name], MD;  [facility];  Service: Endoscopy;  Laterality: N/A;   PEG PLACEMENT N/A 06/13/2013   Procedure: PERCUTANEOUS ENDOSCOPIC GASTROSTOMY (PEG) REPLACEMENT;  Surgeon: [name], MD;  [facility];  Service: Endoscopy;  Laterality: N/A;   SPINAL FUSION     spinal fusion to correct 70 degree kyphosis  11-2010   spinal fusioncorrect 106 degree kyphosis     SPINE SURGERY  ,11/20/2010, 2011   for correction of severe contracturing spinal kyphosis.    TONSILLECTOMY      There were no vitals filed for this visit.    Subjective Assessment - 08/21/22 1351     Subjective Pt presents for equipment eval - new [employer] HTS is needed: pt accompanied by mother and PCA, Janett Billow   pt's mother reports pt has had skin breakdown on Lt lateral hip since he was here a few weeks ago   Patient is accompained by: Family member    Pertinent History spastic quadriplegic CP, scoliosis with rod implant    Patient Stated Goals obtain new [employer] HTS chair from [employer]    Currently in Pain? No/denies                [facility] PT Assessment - 08/21/22 0001       Assessment   Medical Diagnosis Spastic quadriplegic CP    Referring Provider (PT) [name], MD    Onset Date/Surgical Date --   congenital     Precautions   Precautions Fall;Other (comment)   Nonambulatory     Balance Screen   Has the patient fallen in the past 6 months No    Has the patient had a decrease in activity level because of a fear of falling?  No    Is the patient reluctant to leave their home because of a fear of falling?  No      Prior Function   Level of Independence Needs assistance with transfers;Needs assistance with ADLs   dependent for all mobility and ADL's                       Objective measurements completed on examination: See above findings.         LMN for [employer] HTS shower/commode chair to be completed -  will be faxed to [employer] (per pt's mother's request)  so that they may obtain this DME from  this company            PT Long Term Goals - 08/21/22 1400       PT LONG TERM GOAL #1   Title N/A - eval only; LMN to be written                    Plan - 08/21/22 1356     Clinical Impression Statement Pt is a 36 yr old male with spastic quadriplegia. He presents for equipment evaluation - mother is requesting a new [employer] HTS shower/commode chair.  LMN to be written - [employer] is company to be used in obtaining this DME.    Personal Factors and Comorbidities Past/Current Experience;Comorbidity 1;Time since onset of injury/illness/exacerbation    Comorbidities scoliosis with rod implant, dysphagia, G tube in place    Examination-Activity Limitations Bathing;Bed Mobility;Locomotion Level;Transfers;Toileting;Stairs;Squat;Self Feeding;Bend    Examination-Participation Restrictions Community Activity;Shop;Laundry;Meal Prep    Stability/Clinical Decision Making Stable/Uncomplicated    Clinical Decision Making Low    Rehab Potential Good    PT Frequency One time visit    PT Duration --   1 visit only - EVAL for DME   Recommended Other [place] to assist in obtaining new [employer] HTS shower chair    Consulted and Agree with Plan of Care Patient    Family Member Consulted mother [name]             Patient will benefit from skilled therapeutic intervention in order to improve the following deficits and impairments:  Difficulty walking, Decreased balance, Decreased mobility, Hypomobility, Impaired tone, Impaired UE functional use, Impaired flexibility, Decreased coordination, Decreased range of motion, Decreased endurance  Visit Diagnosis: Spastic quadriplegic cerebral palsy ([place]) - Plan: PT plan of care cert/re-cert     Problem List Patient Active Problem List   Diagnosis Date Noted   Spastic quadriplegic cerebral palsy ([place])  08/18/2022   Agitation 08/18/2022   Insomnia 06/19/2022   Acute conjunctivitis of right eye 06/11/2022   Bilateral knee pain 06/11/2022   Therapeutic drug monitoring 06/11/2022   PVD (peripheral vascular disease) ([place]) 08/10/2021   Close exposure to COVID-19 virus 10/16/2019   Mildly underweight adult 03/16/2017   Skin lesion of right ear 03/16/2017   Increased oropharyngeal secretions 04/14/2016   Medicare annual wellness visit, subsequent 08/25/2015   Hyperlipidemia, mild 08/25/2015   Recurrent pneumonia 12/17/2014   Esophageal dysphagia 09/06/2014   Dysphagia, pharyngoesophageal phase 09/02/2014   Impetigo 09/02/2014   Abnormal thyroid function test 06/10/2014   Protein-calorie malnutrition, severe ([place]) 11/23/2013   Gastrostomy in place [facility]) 08/31/2013   Spastic quadriplegia ([place]) 08/23/2013   Cervical dystonia 08/23/2013   Dysphagia, oropharyngeal phase 08/10/2013   Low back pain 07/11/2013   PORTAL VEIN THROMBOSIS 12/12/2010   Anemia 12/05/2010   KYPHOSIS 11/11/2010   GASTROSTOMY COMPLICATION 41/32/4401   Infantile cerebral palsy ([place]) 08/04/2010   Thyroid disease 08/01/2010   Anxiety 08/01/2010   Depression with anxiety 08/01/2010   GERD 08/01/2010   OTHER ACNE 10/16/2009   PALPITATIONS 02/25/2009   HIP PAIN, BILATERAL 11/06/2008   NEVI, MULTIPLE 10/09/2008   Incontinence of feces 04/02/2008   Urinary incontinence 04/02/2008   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

## 2022-08-24 ENCOUNTER — Telehealth: Payer: Self-pay | Admitting: Neurology

## 2022-08-24 NOTE — Telephone Encounter (Signed)
error 

## 2022-08-31 ENCOUNTER — Ambulatory Visit (INDEPENDENT_AMBULATORY_CARE_PROVIDER_SITE_OTHER): Payer: Medicare Other | Admitting: Neurology

## 2022-08-31 DIAGNOSIS — G800 Spastic quadriplegic cerebral palsy: Secondary | ICD-10-CM

## 2022-08-31 DIAGNOSIS — G802 Spastic hemiplegic cerebral palsy: Secondary | ICD-10-CM

## 2022-08-31 DIAGNOSIS — R451 Restlessness and agitation: Secondary | ICD-10-CM | POA: Diagnosis not present

## 2022-09-01 ENCOUNTER — Ambulatory Visit (HOSPITAL_BASED_OUTPATIENT_CLINIC_OR_DEPARTMENT_OTHER): Payer: Medicare Other

## 2022-09-01 ENCOUNTER — Ambulatory Visit (INDEPENDENT_AMBULATORY_CARE_PROVIDER_SITE_OTHER): Payer: Medicare Other | Admitting: Family Medicine

## 2022-09-01 VITALS — BP 118/79 | HR 109 | Temp 98.3°F | Resp 16 | Wt 92.0 lb

## 2022-09-01 DIAGNOSIS — G800 Spastic quadriplegic cerebral palsy: Secondary | ICD-10-CM | POA: Diagnosis not present

## 2022-09-01 DIAGNOSIS — Z23 Encounter for immunization: Secondary | ICD-10-CM | POA: Diagnosis not present

## 2022-09-01 DIAGNOSIS — Z931 Gastrostomy status: Secondary | ICD-10-CM | POA: Diagnosis not present

## 2022-09-01 DIAGNOSIS — R32 Unspecified urinary incontinence: Secondary | ICD-10-CM | POA: Diagnosis not present

## 2022-09-01 DIAGNOSIS — E785 Hyperlipidemia, unspecified: Secondary | ICD-10-CM

## 2022-09-01 NOTE — Progress Notes (Signed)
Subjective:   By signing my name below, I, [name], attest that this documentation has been prepared under the direction and in the presence of Chad Lukes, MD., 09/01/2022.     Patient ID: [name], male    DOB: 02/15/1986, 36 y.o.   MRN: [id number]  Chief Complaint  Patient presents with   Follow-up    Here for follow up   HPI Patient is in today for an office visit. [name] and caregiver are with him and speaking on his behalf.  Immunizations: He will receive the Flu immunization today.   Osteoarthritis: He received bilateral knee injections on 06/25/22 to manage his chronic knee pain and osteoarthritis.   Neurology: His mother reports that he recently saw his neurologist, Dr. Krista Blue, who has scheduled a CAT scan for 09/02/22. He is currently taking Lamotrigine 25 mg to control his agitation and his mother says that it has been effective.   Spastic Quadriplegic Cerebral Palsy: His mother reports that he is sleeping well and his temperament has been okay. He is making ineligible sounds during today's visit.  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   GERD (gastroesophageal reflux disease)    Hyperlipidemia, mild 08/25/2015   Hyperthyroidism    Incontinence of feces    Loss of weight 08/28/2014   Medicare annual wellness visit, subsequent 08/25/2015   Mildly underweight adult 03/16/2017   Palpitations    PALSY, INFANTILE CEREBRAL, 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   Qualifier: Diagnosis of  By: [name]  Working with PMR Dr [name] and tolerating Botox injections in hips, shoulders, etc with good results   Skin lesion of right ear 03/16/2017   Thyroid disease 08/01/2010   Qualifier: Diagnosis of  By: [name] (AAMA),  [name]     Past Surgical History:  Procedure Laterality Date   baclofen trial     baslofen pump implant     ears tubes     EYE SURGERY     FLEXIBLE SIGMOIDOSCOPY N/A 09/07/2014   Procedure: FLEXIBLE SIGMOIDOSCOPY;  Surgeon: [name], MD;  Location: [facility] ENDOSCOPY;  Service: Endoscopy;  Laterality: N/A;   g-tube insert  August 2006   hamstring released     to treat contractures.    HIP SURGERY     x2 , side    IR CM INJ ANY COLONIC TUBE W/FLUORO  05/28/2017   IR CM INJ ANY COLONIC TUBE W/FLUORO  07/07/2019   IR CM INJ ANY COLONIC TUBE W/FLUORO  09/18/2019   IR GASTR TUBE CONVERT GASTR-JEJ PER W/FL MOD SED  03/14/2019   IR GENERIC HISTORICAL  07/01/2016   IR GASTR TUBE CONVERT GASTR-JEJ PER W/FL MOD SED 07/01/2016 [name], MD [facility]   IR GENERIC HISTORICAL  07/08/2016   IR PATIENT EVAL TECH 0-60 MINS 07/08/2016 [name], MD [facility]   IR GENERIC HISTORICAL  07/14/2016   IR GJ TUBE CHANGE 07/14/2016 Chad Mariscal, MD [facility]   IR GENERIC HISTORICAL  07/21/2016   IR PATIENT EVAL TECH 0-60 MINS [facility]   IR GENERIC HISTORICAL  08/31/2016   IR [place] DUODEN/JEJUNO TUBE PERCUT W/FLUORO 08/31/2016 Chad Keen, MD [facility]   IR GENERIC HISTORICAL  09/03/2016   IR GJ TUBE  CHANGE 09/03/2016 Chad Mariscal, MD [facility]   IR GENERIC HISTORICAL  09/10/2016   IR GASTR TUBE CONVERT GASTR-JEJ PER W/FL MOD SED 09/10/2016 [facility]   IR GENERIC HISTORICAL  09/16/2016   IR PATIENT EVAL TECH 0-60 MINS [facility]   IR GENERIC HISTORICAL  09/29/2016   IR GJ TUBE CHANGE 09/29/2016 Chad Cleveland, MD [facility]   IR GJ TUBE CHANGE  02/05/2017   IR GJ TUBE CHANGE  05/21/2017   IR GJ TUBE CHANGE  08/25/2017   IR GJ TUBE CHANGE  01/18/2018   IR GJ TUBE CHANGE  02/04/2018   IR GJ TUBE CHANGE  04/21/2018   IR GJ TUBE CHANGE  08/18/2018   IR GJ TUBE CHANGE  09/12/2019   IR GJ TUBE CHANGE  01/03/2020   IR GJ TUBE CHANGE  05/13/2020   IR GJ TUBE CHANGE  09/11/2020   IR GJ TUBE  CHANGE  02/27/2021   IR GJ TUBE CHANGE  08/01/2021   IR GJ TUBE CHANGE  12/31/2021   IR GJ TUBE CHANGE  05/11/2022   IR GJ TUBE CHANGE  07/01/2022   IR MECH REMOV OBSTRUC MAT ANY COLON TUBE W/FLUORO  09/02/2018   IR REPLC GASTRO/COLONIC TUBE PERCUT W/FLUORO  10/17/2018   PEG PLACEMENT  10/21/2011   Procedure: PERCUTANEOUS ENDOSCOPIC GASTROSTOMY (PEG) REPLACEMENT;  Surgeon: Chad Dragon, MD;  Location: [facility] ENDOSCOPY;  Service: Endoscopy;  Laterality: N/A;   PEG PLACEMENT N/A 06/13/2013   Procedure: PERCUTANEOUS ENDOSCOPIC GASTROSTOMY (PEG) REPLACEMENT;  Surgeon: Chad Dragon, MD;  Location: [facility] ENDOSCOPY;  Service: Endoscopy;  Laterality: N/A;   SPINAL FUSION     spinal fusion to correct 70 degree kyphosis  11-2010   spinal fusioncorrect 106 degree kyphosis     SPINE SURGERY  ,11/20/2010, 2011   for correction of severe contracturing spinal kyphosis.    TONSILLECTOMY     Family History  Problem Relation Age of Onset   Asthma Mother    Hyperlipidemia Mother    COPD Mother    Other Mother        bronchial stasis/ABPA   Cancer Maternal Grandmother 65       breast   Hyperlipidemia Maternal Grandmother    Hypertension Maternal Grandmother    Cancer Maternal Grandfather        prostate   Heart disease Paternal Grandfather        CHF   Osteoporosis Paternal Grandmother    Arthritis Paternal Grandmother        rheumatoid   Social History   Socioeconomic History   Marital status: Single    Spouse name: Not on file   Number of children: 0   Years of education: Not on file   Highest education level: Not on file  Occupational History   Occupation: disbaled  Tobacco Use   Smoking status: Never   Smokeless tobacco: Never  Vaping Use   Vaping Use: Never used  Substance and Sexual Activity   Alcohol use: No   Drug use: No   Sexual activity: Never  Other Topics Concern   Not on file  Social History Narrative   Not on file   Social Determinants of Health   Financial Resource Strain:  Not on file  Food Insecurity: Not on file  Transportation Needs: Not on file  Physical Activity: Not on file  Stress: Not on file  Social Connections: Not on file  Intimate Partner Violence: Not on file   Outpatient Medications Prior to  Visit  Medication Sig Dispense Refill   albuterol (PROVENTIL) (2.5 MG/3ML) 0.083% nebulizer solution Take 3 mLs (2.5 mg total) by nebulization every 6 (six) hours as needed for shortness of breath. 75 mL 5   AMBULATORY NON FORMULARY MEDICATION Medication Name: MIC gastrostomy/bolus feeding tube 24 French Part number 0110-24. #2 and 10 cc lurer lock syringe #2 Dx: 4 Device 2   ciprofloxacin (CILOXAN) 0.3 % ophthalmic solution PLACE 2 DROPS INTO THE LEFT EYE EVERY 2 HOURS FOR 5 DAYS; THEN 1 DROP EVERY 2 HOURS WHILE AWAKE FOR 2 DAYS; THEN 1 DROP EVERY 4 HOURS WHILE AWAKE FOR 5 DAYS. 5 mL 0   CYMBALTA 30 MG capsule Take 30 mg by mouth every morning.     CYMBALTA 60 MG capsule Take 60 mg by mouth every morning.     dantrolene (DANTRIUM) 50 MG capsule TAKE 1 CAPSULE BY MOUTH EVERY MORNING, 1 CAPSULE EVERY EVENING, AND 1 CAPSULE AT BEDTIME. 90 capsule 1   DEPAKOTE SPRINKLES 125 MG capsule Take by mouth.     diazepam (VALIUM) 2 MG tablet Take 2 mg by mouth. As needed     diazepam (VALIUM) 5 MG tablet Take 5 mg by mouth 2 (two) times daily.     HYDROcodone bit-homatropine (HYCODAN) 5-1.5 MG/5ML syrup Take 5 mLs by mouth every 6 (six) hours as needed for cough. 240 mL 0   Incontinence Supply Disposable (PREVAIL BREEZERS MEDIUM) MISC pkg of 16- size medium 32" to 44"  Breathable cloth-like outer fabric (can't use the plastic outer surgace  Item # [id number]/[id number] 16 each 6   lamoTRIgine (LAMICTAL) 25 MG CHEW chewable tablet One every night x week One twice a day x 1 week One / 2 at night 2 tabs twice a day 120 tablet 6   Misc. Devices (ALL-BODY MASSAGE) MISC 1 Units/hr by Does not apply route as needed. Full body massage for Muscle spasticity due to Cerebral palsy 99  each 99   mupirocin ointment (BACTROBAN) 2 % Apply to area thin film twice daily if needed 22 g 0   NON FORMULARY Bard Leg Bag Extension tubing w/Connector 18", Sterile, latex-free  Item# [id number]     NON FORMULARY Colorplast Freedom Cath Latex Self-Adhering Male External Catheter 42m Diameter Intermediate  Item# [id number]    NONFORMULARY OR COMPOUNDED ITEM Covidien REF [id number]- Kangaroo Joey Pump Set with Flush Bags - 1000 mL 1 each 0   Nutritional Supplements (FEEDING SUPPLEMENT, KATE FARMS STANDARD 1.4,) LIQD liquid Take 325 mLs by mouth as directed. 3 carton over 16 hrs     OLANZapine (ZYPREXA) 10 MG tablet Take 10 mg by mouth at bedtime.     OLANZapine (ZYPREXA) 5 MG tablet Take by mouth.     Omeprazole-Sodium Bicarbonate (ZEGERID) 20-1100 MG CAPS capsule Take 1 capsule by mouth daily before breakfast.     Ostomy Supplies (PROTECTIVE BARRIER WIPES) MISC 1-1/4" X 3"  Item ##[id number] each 6   oxybutynin (DITROPAN) 5 MG tablet Take 5 mg by mouth 2 (two) times daily.     sodium chloride HYPERTONIC 3 % nebulizer solution Take by nebulization in the morning and at bedtime. 750 mL 12   sucralfate (CARAFATE) 1 g tablet TAKE 1 TABLET BY MOUTH 4 TIMES DAILY WITH MEALS AND AT BEDTIME (Patient taking differently: as needed.) 120 tablet 1   No facility-administered medications prior to visit.   Allergies  Allergen Reactions   Ambien [Zolpidem Tartrate] Nausea Only   Antihistamines, Chlorpheniramine-Type  Other reaction(s): Other (See Comments) Other Reaction: agitation   Augmentin [Amoxicillin-Pot Clavulanate] Diarrhea   Codeine Other (See Comments)    Makes patient too active after a few days.   Metoclopramide Other (See Comments)    Delusion, emotionality    Baclofen Anxiety    anxiety   Pheniramine Rash    Other reaction(s): Other (See Comments) Other Reaction: agitation   Sulfa Antibiotics Rash   Sulfonamide Derivatives Rash   ROS    Objective:    Physical  Exam Cardiovascular:     Rate and Rhythm: Normal rate and regular rhythm.     Pulses: Normal pulses.     Heart sounds: Normal heart sounds. No murmur heard.    No gallop.  Pulmonary:     Effort: Pulmonary effort is normal. No respiratory distress.     Breath sounds: Normal breath sounds. No wheezing or rales.  Abdominal:     General: Bowel sounds are normal.  Skin:    General: Skin is warm and dry.  Psychiatric:        Behavior: Behavior is slowed.        Cognition and Memory: Cognition is impaired.    BP 118/79 (BP Location: Right Arm, Patient Position: Sitting, Cuff Size: Small)   Pulse (!) 109   Temp 98.3 F (36.8 C) (Oral)   Resp 16   Wt 92 lb (41.7 kg)   SpO2 97%   BMI 17.97 kg/m  Wt Readings from Last 3 Encounters:  09/01/22 92 lb (41.7 kg)  03/26/22 92 lb (41.7 kg)  02/05/22 95 lb (43.1 kg)   Diabetic Foot Exam - Simple   No data filed    Lab Results  Component Value Date   WBC 7.3 08/07/2021   HGB 15.5 08/07/2021   HCT 45.7 08/07/2021   PLT 179.0 Repeated and verified X2. 08/07/2021   GLUCOSE 84 06/11/2022   CHOL 129 08/07/2021   TRIG 80.0 08/07/2021   HDL 37.40 (L) 08/07/2021   LDLCALC 76 08/07/2021   ALT 16 06/11/2022   AST 13 06/11/2022   NA 136 06/11/2022   K 4.2 06/11/2022   CL 98 06/11/2022   CREATININE 0.28 (L) 06/11/2022   BUN 7 06/11/2022   CO2 28 06/11/2022   TSH 1.81 08/07/2021   HGBA1C 5.0 04/29/2007   Lab Results  Component Value Date   TSH 1.81 08/07/2021   Lab Results  Component Value Date   WBC 7.3 08/07/2021   HGB 15.5 08/07/2021   HCT 45.7 08/07/2021   MCV 94.8 08/07/2021   PLT 179.0 Repeated and verified X2. 08/07/2021   Lab Results  Component Value Date   NA 136 06/11/2022   K 4.2 06/11/2022   CO2 28 06/11/2022   GLUCOSE 84 06/11/2022   BUN 7 06/11/2022   CREATININE 0.28 (L) 06/11/2022   BILITOT 0.4 06/11/2022   ALKPHOS 73 06/11/2022   AST 13 06/11/2022   ALT 16 06/11/2022   PROT 7.2 06/11/2022   ALBUMIN  4.5 06/11/2022   CALCIUM 9.1 06/11/2022   ANIONGAP 11 09/18/2014   GFR 154.44 06/11/2022   Lab Results  Component Value Date   CHOL 129 08/07/2021   Lab Results  Component Value Date   HDL 37.40 (L) 08/07/2021   Lab Results  Component Value Date   LDLCALC 76 08/07/2021   Lab Results  Component Value Date   TRIG 80.0 08/07/2021   Lab Results  Component Value Date   CHOLHDL 3 08/07/2021   Lab  Results  Component Value Date   HGBA1C 5.0 04/29/2007      Assessment & Plan:   Problem List Items Addressed This Visit     Urinary incontinence    Continues to need catheters and supplies      Gastrostomy in place [facility])    Continues to be NPO and they use tube feeds.      Hyperlipidemia, mild    Encourage heart healthy diet such as MIND or DASH diet, increase exercise, avoid trans fats, simple carbohydrates and processed foods, consider a krill or fish or flaxseed oil cap daily.       Spastic quadriplegic cerebral palsy [facility])    Here today with his mother and doing well no new concerns. They are working on getting him a new shower chair. A new motor for his hoyer lift and a new pillow support for his wheelchair      Other Visit Diagnoses     Need for influenza vaccination    -  Primary   Relevant Orders   Flu Vaccine QUAD 6+ mos PF IM (Fluarix Quad PF) (Completed)      No orders of the defined types were placed in this encounter.  I, [name], MD, personally preformed the services described in this documentation.  All medical record entries made by the scribe were at my direction and in my presence.  I have reviewed the chart and discharge instructions (if applicable) and agree that the record reflects my personal performance and is accurate and complete. 09/01/2022  I,Mohammed Iqbal,acting as a scribe for [name], MD.,have documented all relevant documentation on the behalf of [name], MD,as directed by  [name], MD while in the presence of [name], MD.  [name], MD

## 2022-09-01 NOTE — Assessment & Plan Note (Signed)
Continues to be NPO and they use tube feeds.

## 2022-09-01 NOTE — Assessment & Plan Note (Addendum)
Here today with his mother and doing well no new concerns. They are working on getting him a new shower chair. A new motor for his hoyer lift and a new pillow support for his wheelchair

## 2022-09-01 NOTE — Assessment & Plan Note (Signed)
Encourage heart healthy diet such as MIND or DASH diet, increase exercise, avoid trans fats, simple carbohydrates and processed foods, consider a krill or fish or flaxseed oil cap daily.  °

## 2022-09-01 NOTE — Assessment & Plan Note (Signed)
Continues to need catheters and supplies

## 2022-09-02 ENCOUNTER — Encounter: Payer: Self-pay | Admitting: Neurology

## 2022-09-02 NOTE — Procedures (Signed)
   HISTORY: 36 year old male with cerebral palsy, increased nighttime agitations,  TECHNIQUE:  This is a routine 16 channel EEG recording with one channel devoted to a limited EKG recording.  It was performed during wakefulness, drowsiness and asleep.   Photic stimulation were performed as activating procedures.  There are frequent muscle and eye movement artifact noted.  Upon maximum arousal, posterior dominant waking rhythm consistent of rhythmic alpha range activity . Activities are symmetric over the bilateral posterior derivations and attenuated with eye opening.   Photic stimulation did not alter the tracing.  During EEG recording, patient developed drowsiness and no deeper stage of sleep was achieved  EKG demonstrate sinus rhythm, with heart rate of 100 bpm  CONCLUSION: This is a  normal awake EEG.  There is no electrodiagnostic evidence of epileptiform discharge.  Marcial Pacas, M.D. Ph.D.  [facility] [place], [place] [address] Phone: [phone] Fax:      [phone]

## 2022-09-03 ENCOUNTER — Ambulatory Visit (HOSPITAL_BASED_OUTPATIENT_CLINIC_OR_DEPARTMENT_OTHER)
Admission: RE | Admit: 2022-09-03 | Discharge: 2022-09-03 | Disposition: A | Discharge status: Home / Self Care | Payer: Medicare Other | Source: Ambulatory Visit | Attending: Neurology | Admitting: Neurology

## 2022-09-03 DIAGNOSIS — R451 Restlessness and agitation: Secondary | ICD-10-CM | POA: Insufficient documentation

## 2022-09-03 DIAGNOSIS — G802 Spastic hemiplegic cerebral palsy: Secondary | ICD-10-CM | POA: Insufficient documentation

## 2022-09-03 DIAGNOSIS — G800 Spastic quadriplegic cerebral palsy: Secondary | ICD-10-CM | POA: Diagnosis not present

## 2022-09-03 DIAGNOSIS — G809 Cerebral palsy, unspecified: Secondary | ICD-10-CM | POA: Diagnosis not present

## 2022-09-03 DIAGNOSIS — M542 Cervicalgia: Secondary | ICD-10-CM | POA: Diagnosis not present

## 2022-09-03 DIAGNOSIS — M549 Dorsalgia, unspecified: Secondary | ICD-10-CM | POA: Diagnosis not present

## 2022-09-03 DIAGNOSIS — M546 Pain in thoracic spine: Secondary | ICD-10-CM | POA: Diagnosis not present

## 2022-09-03 DIAGNOSIS — Q040 Congenital malformations of corpus callosum: Secondary | ICD-10-CM | POA: Diagnosis not present

## 2022-09-09 DIAGNOSIS — F339 Major depressive disorder, recurrent, unspecified: Secondary | ICD-10-CM | POA: Diagnosis not present

## 2022-09-11 IMAGING — DX DG CHEST 1V
1 series · 1 of 1 positions shown · non-contrast
Comparison: May 2019

CLINICAL DATA: Congestion

EXAM:
CHEST  1 VIEW

[chest pa]
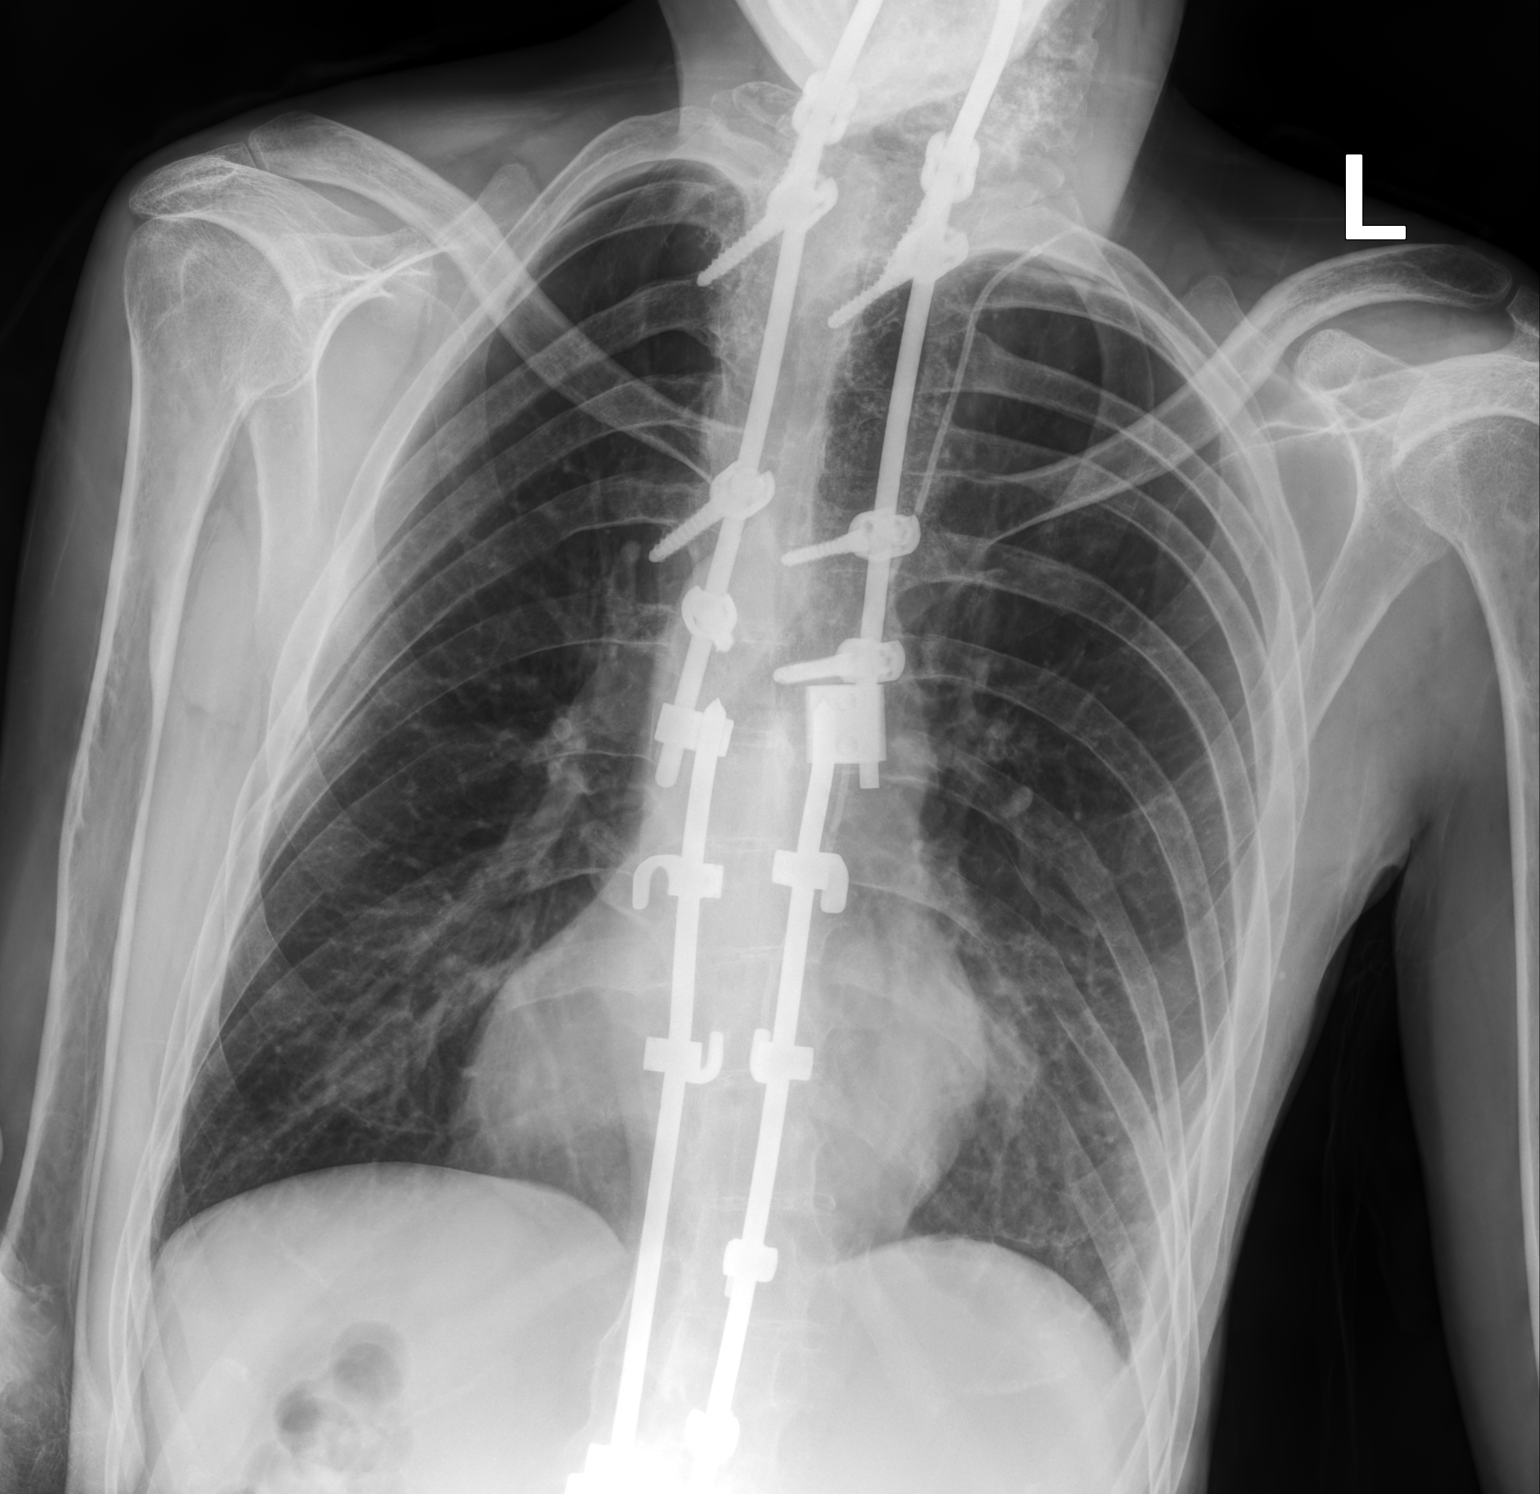

[1 of 1 positions shown; findings below may reference images not displayed]

FINDINGS: Patient is rotated. New small opacity at the left lung base. Normal
heart size. No acute osseous abnormality.
IMPRESSION: New small opacity at the left lung base may be pleural and reflect
scarring or effusion.

## 2022-09-23 ENCOUNTER — Other Ambulatory Visit: Payer: Self-pay | Admitting: Physical Medicine & Rehabilitation

## 2022-09-23 ENCOUNTER — Encounter: Payer: Medicare Other | Admitting: Physical Medicine & Rehabilitation

## 2022-09-23 DIAGNOSIS — G8250 Quadriplegia, unspecified: Secondary | ICD-10-CM

## 2022-09-23 DIAGNOSIS — G809 Cerebral palsy, unspecified: Secondary | ICD-10-CM

## 2022-10-01 ENCOUNTER — Encounter: Payer: Self-pay | Admitting: Neurology

## 2022-10-01 DIAGNOSIS — G800 Spastic quadriplegic cerebral palsy: Secondary | ICD-10-CM

## 2022-10-01 DIAGNOSIS — R451 Restlessness and agitation: Secondary | ICD-10-CM

## 2022-10-07 IMAGING — DX DG CHEST 2V
2 series · 2 of 2 positions shown · non-contrast
Comparison: 02/05/2022

CLINICAL DATA: Persistent cough.  Aspiration pneumonia.

EXAM:
CHEST - 2 VIEW

[chest lat]
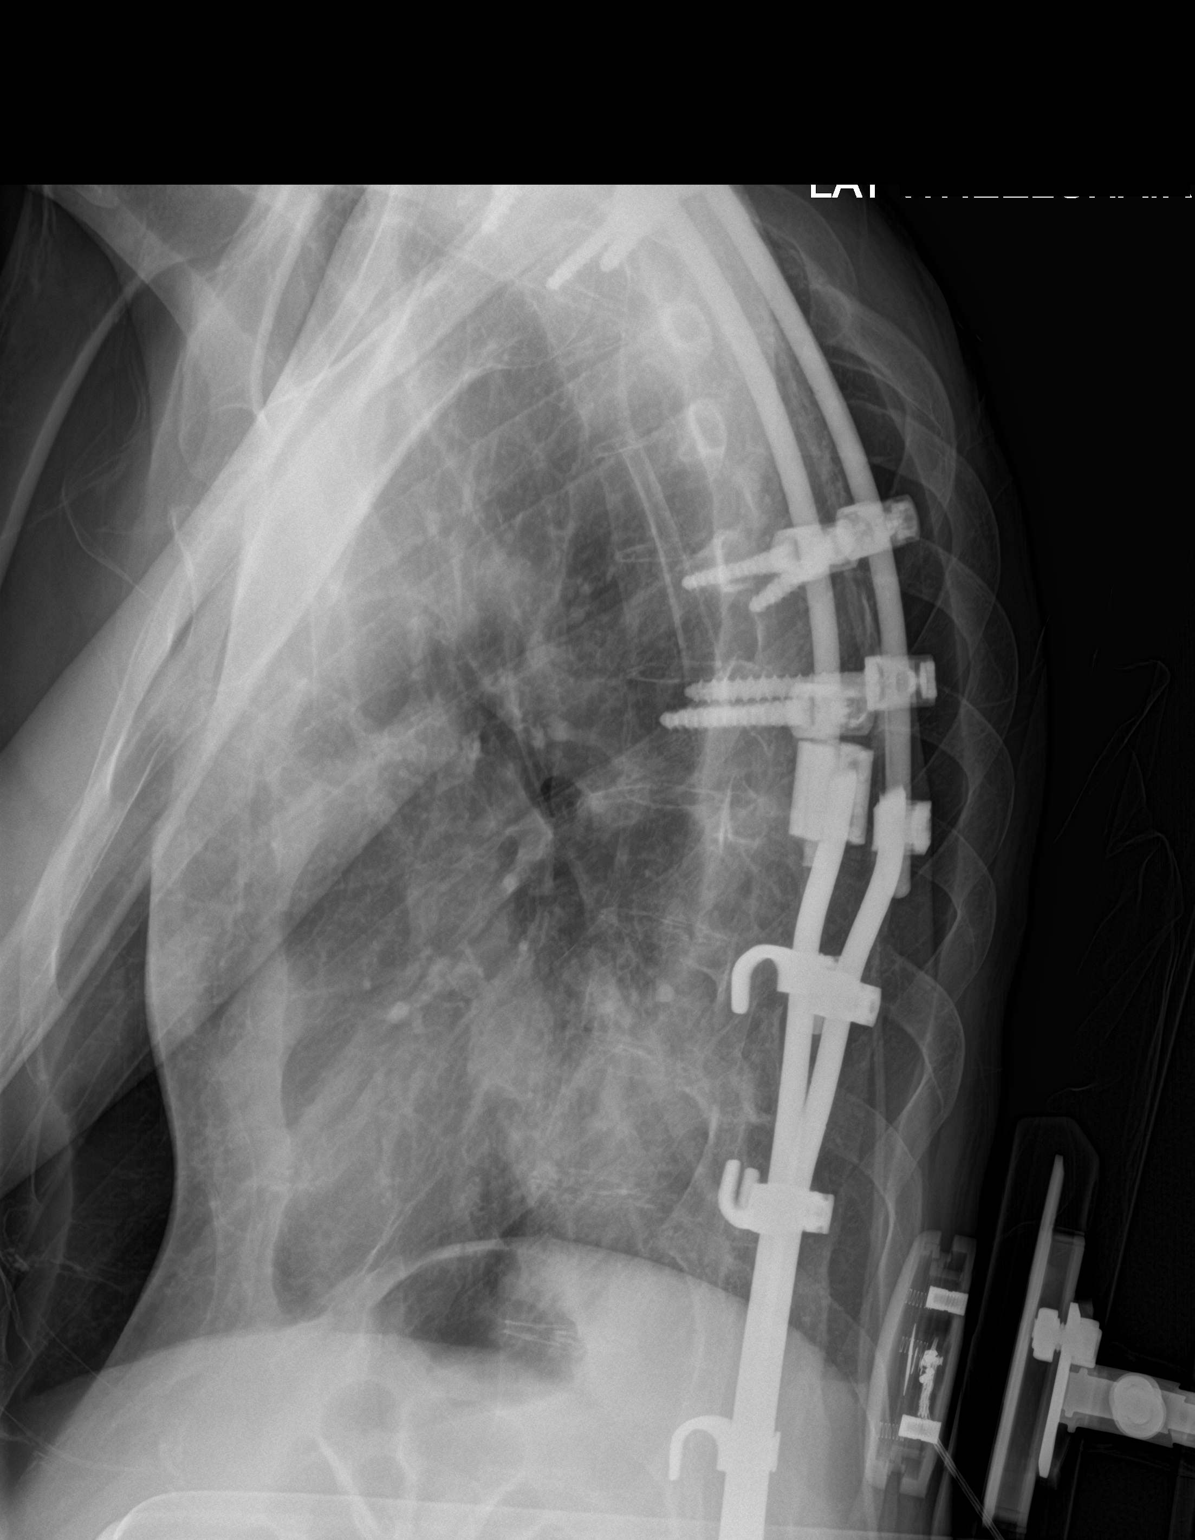

[chest ap strecther]
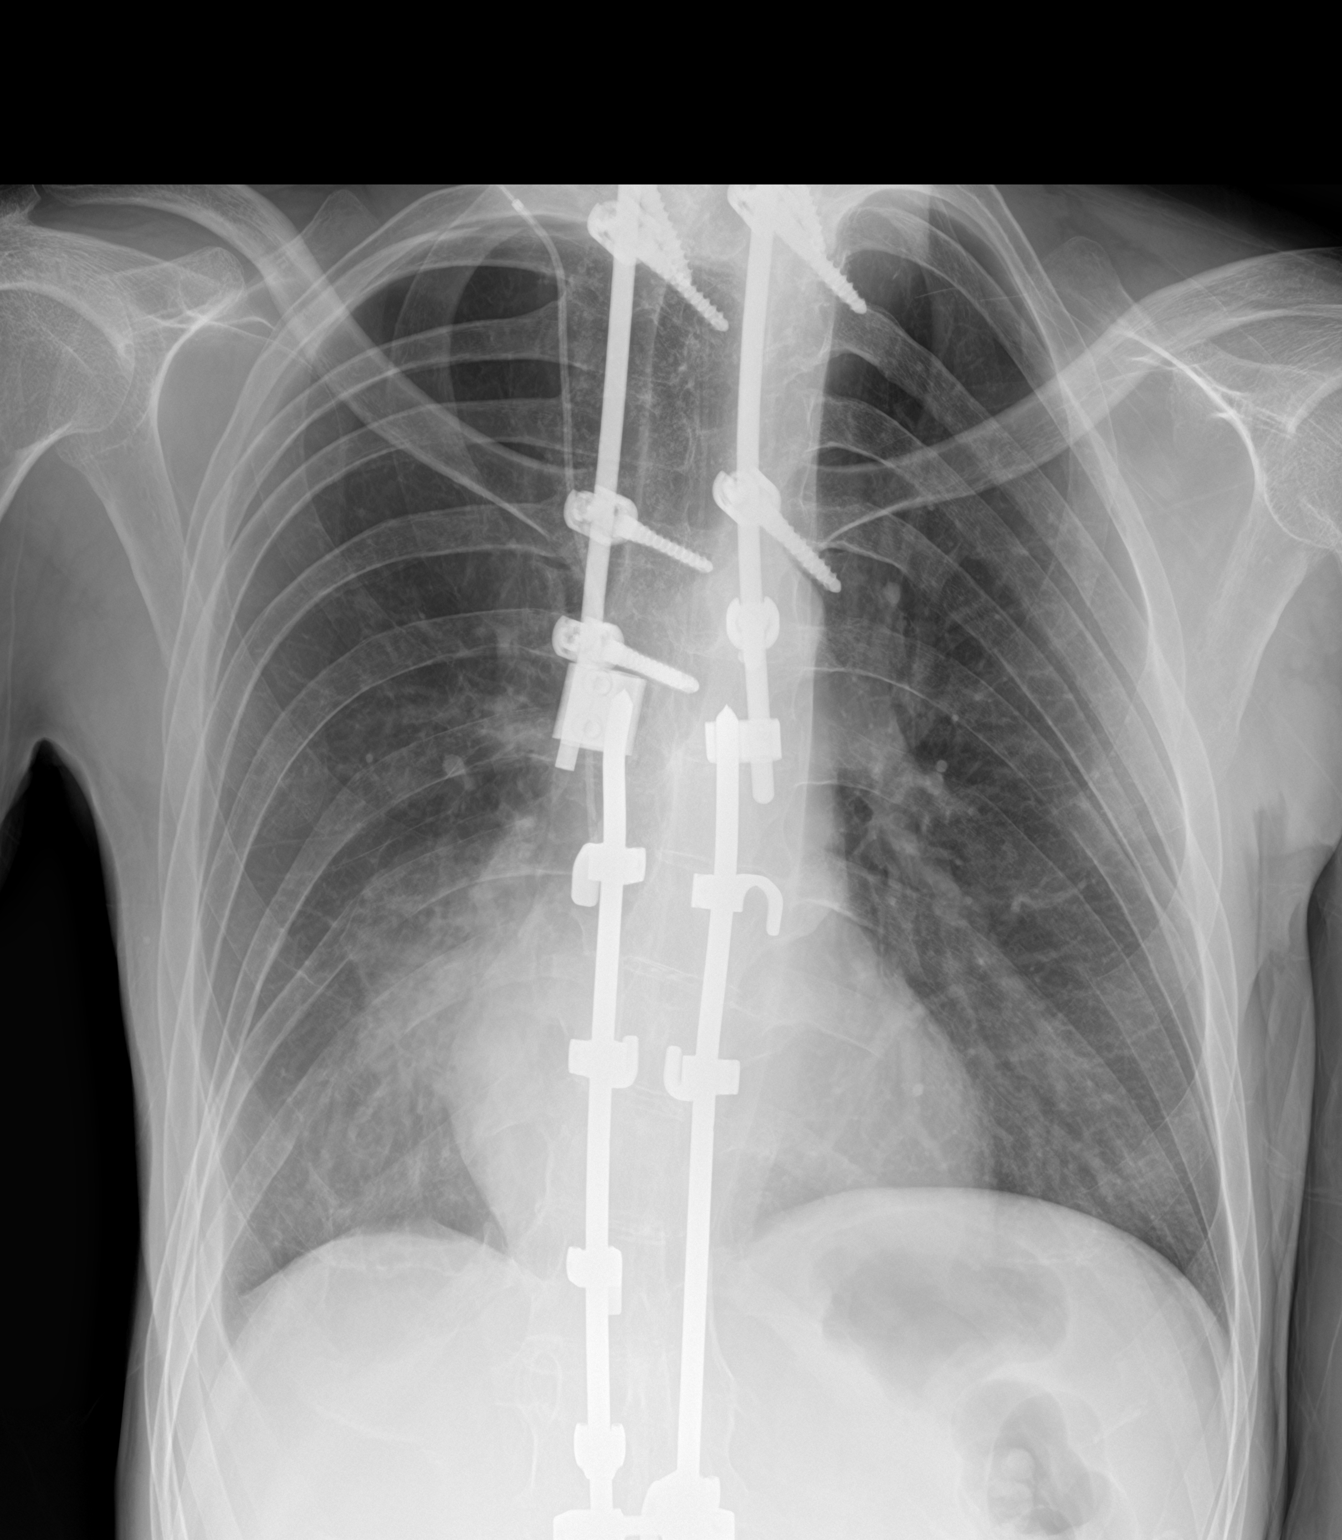

[2 of 2 positions shown; findings below may reference images not displayed]

FINDINGS: The heart size and mediastinal contours are within normal limits.
Right jugular central venous catheter remains in appropriate
position. Posterior spinal fixation rods are seen throughout the
cervical, thoracic, and lumbar spine.

New airspace opacity is seen in the medial right lung base, likely
within the lower lobe, consistent with pneumonia. Left lung remains
clear. No evidence of pleural effusion.
IMPRESSION: New right lower lobe airspace disease, consistent with pneumonia.
Recommend continued chest radiographic follow-up to confirm
resolution.

## 2022-10-13 ENCOUNTER — Encounter: Payer: Self-pay | Admitting: Physical Medicine & Rehabilitation

## 2022-10-19 ENCOUNTER — Ambulatory Visit (INDEPENDENT_AMBULATORY_CARE_PROVIDER_SITE_OTHER): Payer: Medicare Other | Admitting: Family Medicine

## 2022-10-19 ENCOUNTER — Encounter: Payer: Self-pay | Admitting: Family Medicine

## 2022-10-19 VITALS — Temp 97.5°F

## 2022-10-19 DIAGNOSIS — J40 Bronchitis, not specified as acute or chronic: Secondary | ICD-10-CM

## 2022-10-19 MED ORDER — DOXYCYCLINE CALCIUM 50 MG/5ML PO SYRP: 100.0000 mg | ORAL_SOLUTION | Freq: Two times a day (BID) | ORAL | 0 refills | Status: AC

## 2022-10-19 MED ORDER — PREDNISOLONE SODIUM PHOSPHATE 15 MG/5ML PO SOLN: 12.5000 mL | Freq: Every day | ORAL | 0 refills | Status: AC

## 2022-10-19 NOTE — Patient Instructions (Signed)
Continue to push fluids, practice good hand hygiene, and cover your mouth if you cough.  If you start having fevers, shaking or shortness of breath, seek immediate care.  We will send in the appropriate medication pending the results.   Let us know if you need anything.

## 2022-10-19 NOTE — Progress Notes (Signed)
Chief Complaint  Patient presents with   Cough    [name] reports productive cough that started Friday    Fever    Mother reports fever on and off since Friday, "up to 104"    [name] here for URI complaints. Here w aide, mom who provide hx.   Duration: 3 days  Associated symptoms: Fever (104 F), sinus congestion, wheezing, shortness of breath, and coughing, decreased energy. Denies: rhinorrhea and ear drainage Treatment to date: Tyelnol, Mucinex Sick contacts: Yes; mom   Past Medical History:  Diagnosis Date   Cerebral palsy (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   GERD (gastroesophageal reflux disease)    Hyperlipidemia, mild 08/25/2015   Hyperthyroidism    Incontinence of feces    Loss of weight 08/28/2014   Medicare annual wellness visit, subsequent 08/25/2015   Mildly underweight adult 03/16/2017   Palpitations    PALSY, INFANTILE CEREBRAL, 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   Qualifier: Diagnosis of  By: Jerold Coombe  Working with PMR Dr Tessa Lerner and tolerating Botox injections in hips, shoulders, etc with good results   Skin lesion of right ear 03/16/2017   Thyroid disease 08/01/2010   Qualifier: Diagnosis of  By: Harlon Ditty CMA (AAMA), Dottie      Objective Temp (!) 97.5 F (36.4 C) (Temporal)  General: Awake, alert, appears stated age HEENT: AT, , ears patent b/l and TM's neg, nares patent w/o discharge, pharynx pink and without exudates, MMD Neck: No masses or asymmetry Heart: RRR Lungs: TUN's heard throughout, diffuse exp wheezes, no accessory muscle use  Wheezy bronchitis - Plan: COVID-19, Flu A+B and RSV, doxycycline (VIBRAMYCIN) 50 MG/5ML SYRP  Await above results. 5 d burst of prednisolone and 7 d course of doxy per tube. Continue to push fluids. F/u prn. If starting  to experience fevers, shaking, or shortness of breath, seek immediate care. Pt's mom voiced understanding and agreement to the plan.  [place], DO 10/19/22 3:05 PM

## 2022-10-21 ENCOUNTER — Other Ambulatory Visit: Payer: Self-pay | Admitting: Family Medicine

## 2022-10-21 LAB — COVID-19, FLU A+B AND RSV
Influenza A, NAA: DETECTED — AB
Influenza B, NAA: NOT DETECTED
RSV, NAA: NOT DETECTED
SARS-CoV-2, NAA: NOT DETECTED

## 2022-10-21 MED ORDER — OSELTAMIVIR PHOSPHATE 6 MG/ML PO SUSR: 75.0000 mg | Freq: Two times a day (BID) | ORAL | 0 refills | Status: DC

## 2022-10-22 ENCOUNTER — Ambulatory Visit (HOSPITAL_BASED_OUTPATIENT_CLINIC_OR_DEPARTMENT_OTHER)
Admission: RE | Admit: 2022-10-22 | Discharge: 2022-10-22 | Disposition: A | Discharge status: Home / Self Care | Payer: Medicare Other | Source: Ambulatory Visit | Attending: Family Medicine | Admitting: Family Medicine

## 2022-10-22 ENCOUNTER — Telehealth: Payer: Self-pay | Admitting: Family Medicine

## 2022-10-22 ENCOUNTER — Other Ambulatory Visit: Payer: Self-pay | Admitting: Family Medicine

## 2022-10-22 DIAGNOSIS — R059 Cough, unspecified: Secondary | ICD-10-CM | POA: Insufficient documentation

## 2022-10-22 MED ORDER — HYDROCOD POLI-CHLORPHE POLI ER 10-8 MG/5ML PO SUER: 5.0000 mL | Freq: Two times a day (BID) | ORAL | 0 refills | Status: DC | PRN

## 2022-10-22 NOTE — Telephone Encounter (Signed)
Pt's mom states her and pt are still very sick. His fever is still going up to 104 and they are with a [place] nurse at the moment. They were wondering if either of them should be prescribed more medication or if [name] may need an x-ray. Please advise.

## 2022-10-22 NOTE — Telephone Encounter (Signed)
Called pt mother and stated will need to come in for  Cxr, will like Tussionex for cough

## 2022-10-23 ENCOUNTER — Encounter: Payer: Self-pay | Admitting: Family Medicine

## 2022-10-23 ENCOUNTER — Other Ambulatory Visit: Payer: Self-pay | Admitting: Family Medicine

## 2022-10-23 MED ORDER — LEVOFLOXACIN 25 MG/ML PO SOLN: 250.0000 mg | Freq: Every day | ORAL | 0 refills | Status: AC

## 2022-10-30 IMAGING — DX DG CHEST 2V
2 series · 2 of 2 positions shown · non-contrast
Comparison: 03/03/2022

CLINICAL DATA: Cough

EXAM:
CHEST - 2 VIEW

[chest ap]
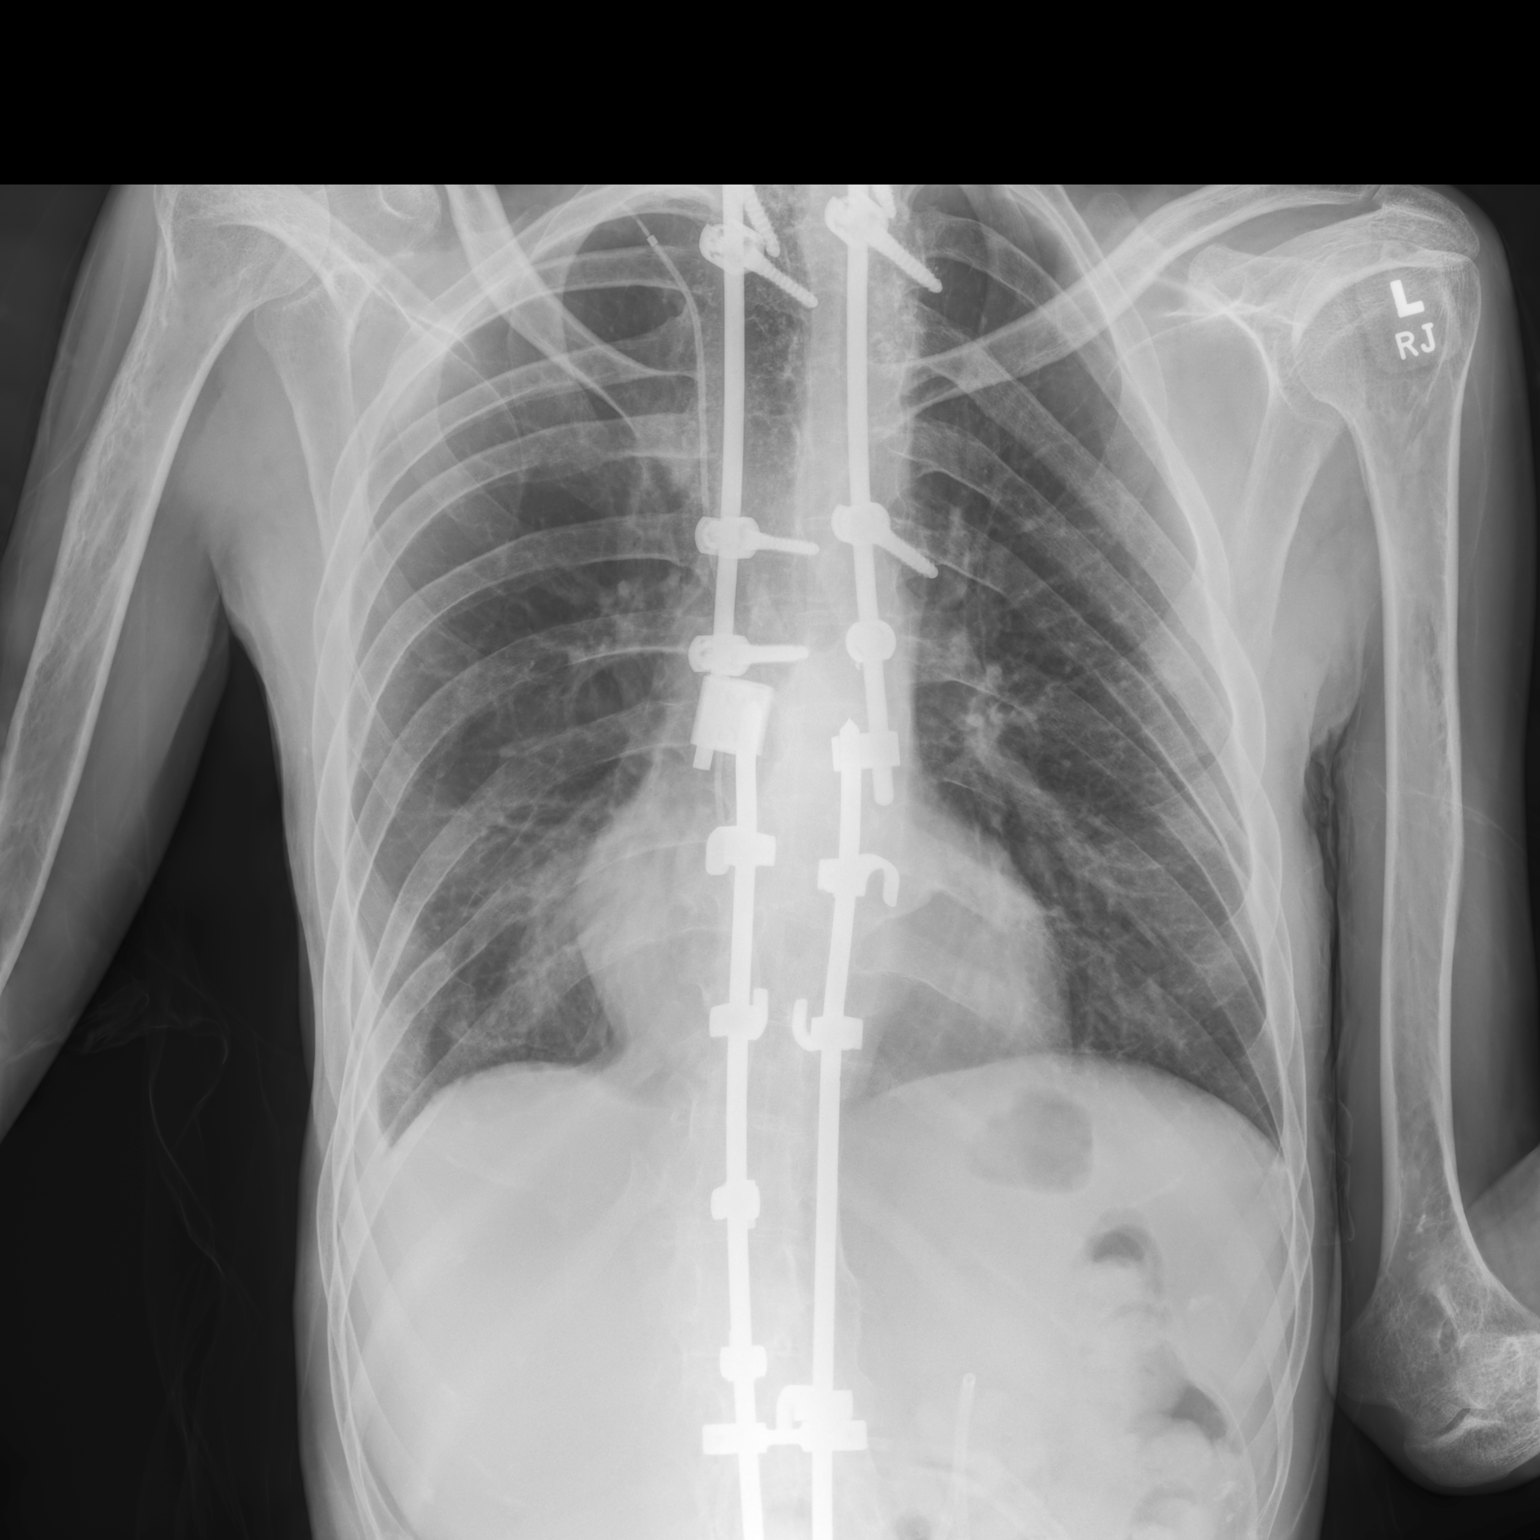

[chest lat]
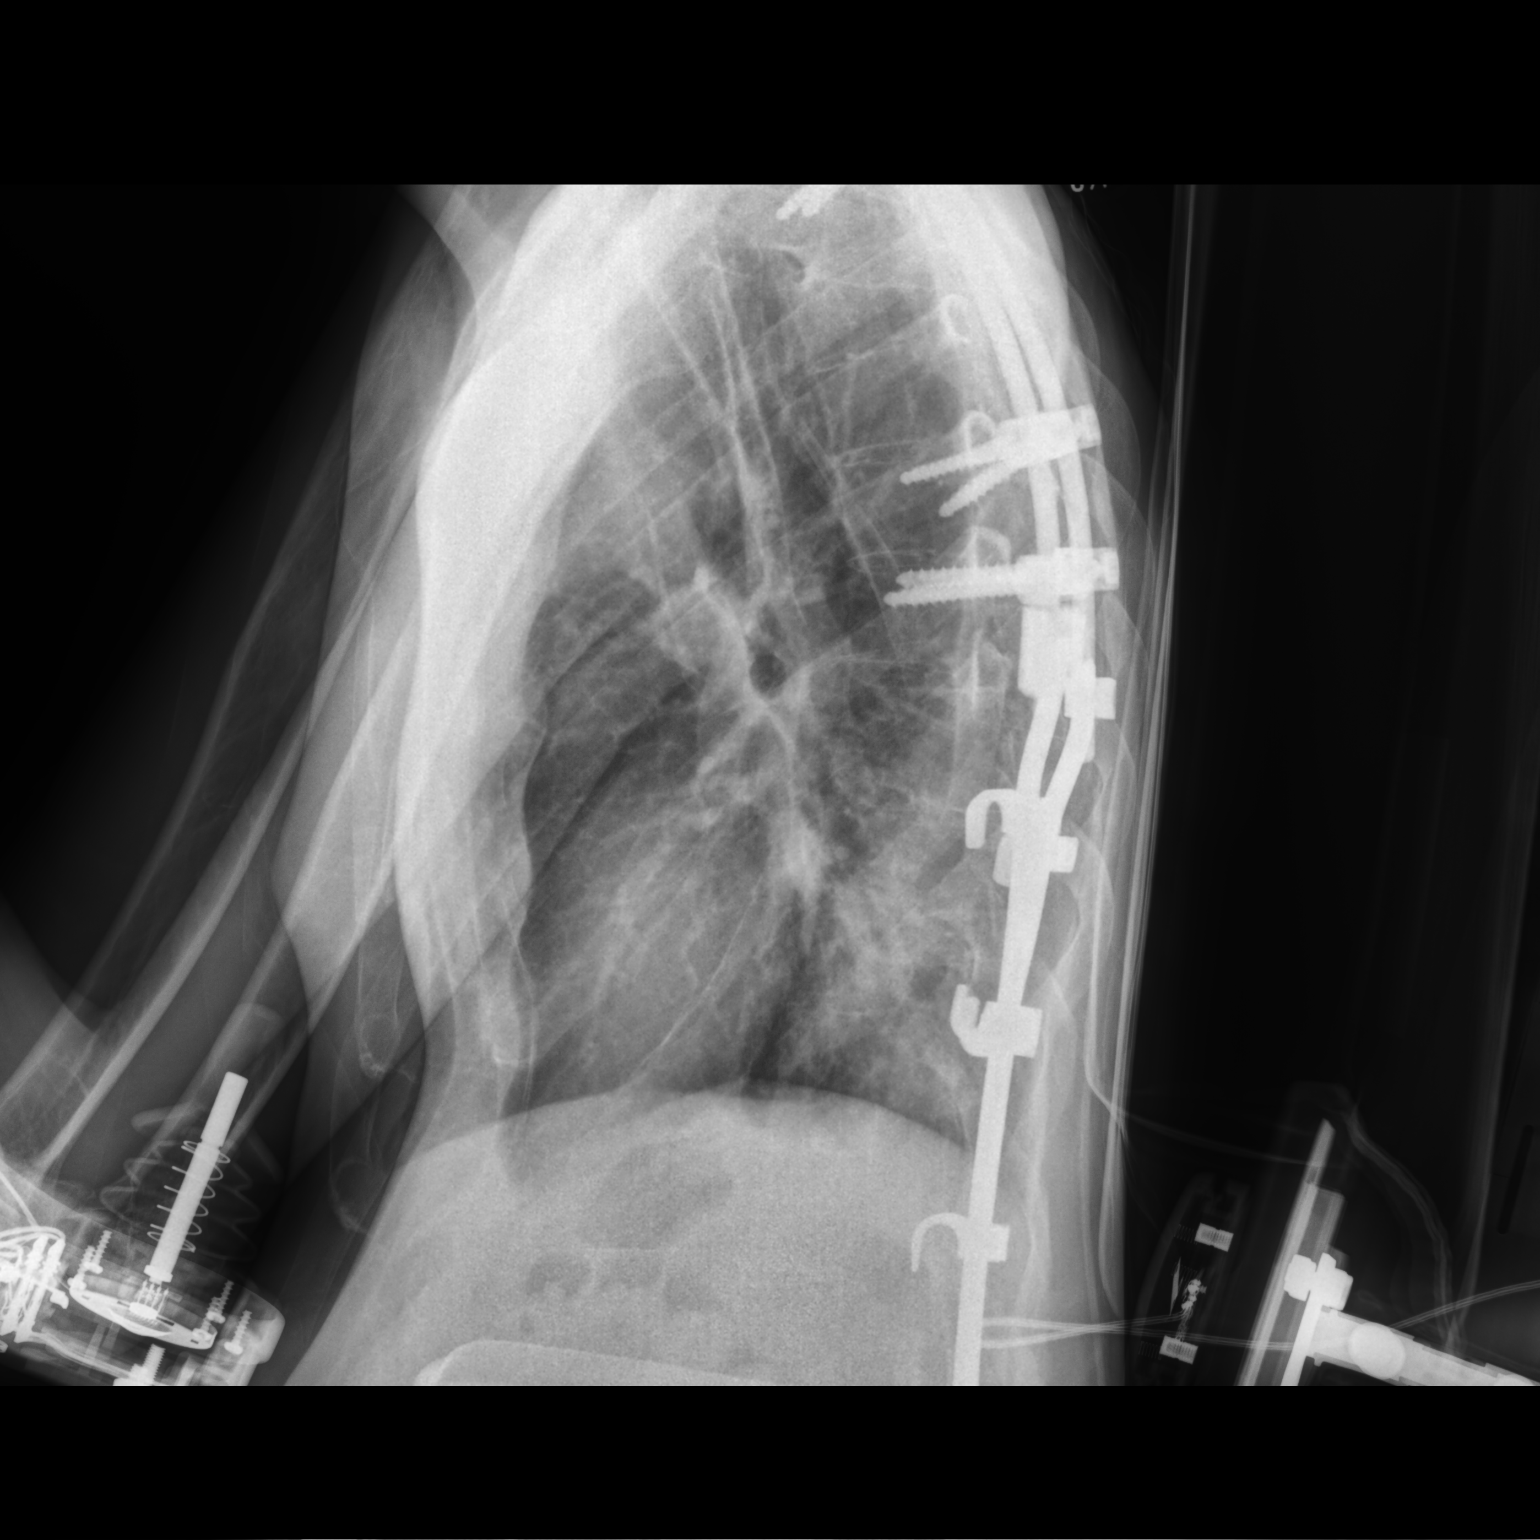

[2 of 2 positions shown; findings below may reference images not displayed]

FINDINGS: Cardiac size is within normal limits. There is interval decrease in
infiltrate in the right lower lung fields. There are no new
infiltrates or signs of pulmonary edema. There is blunting of right
lateral CP angle. There is no pneumothorax. Harrington rods are
noted in the thoracic and lumbar spine. Catheter is noted in the
course of right internal jugular innominate and superior vena cava
without definite extension outside the patient's body possibly
retained segment of central venous catheter.
IMPRESSION: There is partial clearing of infiltrate in the right lower lung
fields. Small right pleural effusion.

## 2022-11-13 DIAGNOSIS — F339 Major depressive disorder, recurrent, unspecified: Secondary | ICD-10-CM | POA: Diagnosis not present

## 2022-11-28 ENCOUNTER — Other Ambulatory Visit: Payer: Self-pay | Admitting: Physical Medicine & Rehabilitation

## 2022-11-28 DIAGNOSIS — G809 Cerebral palsy, unspecified: Secondary | ICD-10-CM

## 2022-11-28 DIAGNOSIS — G8250 Quadriplegia, unspecified: Secondary | ICD-10-CM

## 2022-12-01 ENCOUNTER — Telehealth (INDEPENDENT_AMBULATORY_CARE_PROVIDER_SITE_OTHER): Payer: Medicare Other | Admitting: Family Medicine

## 2022-12-01 DIAGNOSIS — J189 Pneumonia, unspecified organism: Secondary | ICD-10-CM | POA: Diagnosis not present

## 2022-12-01 DIAGNOSIS — Z931 Gastrostomy status: Secondary | ICD-10-CM | POA: Diagnosis not present

## 2022-12-01 DIAGNOSIS — R32 Unspecified urinary incontinence: Secondary | ICD-10-CM

## 2022-12-01 DIAGNOSIS — F418 Other specified anxiety disorders: Secondary | ICD-10-CM | POA: Diagnosis not present

## 2022-12-01 NOTE — Progress Notes (Addendum)
MyChart Video Visit Virtual Visit via Video Note   This visit type was conducted due to national recommendations for restrictions regarding the COVID-19 Pandemic (e.g. social distancing) in an effort to limit this patient's exposure and mitigate transmission in our community. This patient is at least at moderate risk for complications without adequate follow up. This format is felt to be most appropriate for this patient at this time. Physical exam was limited by quality of the video and audio technology used for the visit. [name], CMA, was able to get the patient set up on a video visit.  Patient location: Patient's home Provider location: Office  I discussed the limitations of evaluation and management by telemedicine and the availability of in person appointments. The patient expressed understanding and agreed to proceed.  Visit Date: 12/01/2022  Today's healthcare provider: [name], MD     Subjective:    Patient ID: [name], male    DOB: [birth year], 37 y.o.   MRN: [id number]  No chief complaint on file.  HPI Patient is in today for a virtual visit and his mother [name] is speaking on his behalf. denies CP/palpitations/SOB/HA/congestion/ fever/chills/GI or GU symptoms.  Pneumonia [name] reports that he has recovered from pneumonia which he acquired in 10/2022 and denies that he has any residual cough. Levaquin was tolerable. He is doing well otherwise and continues to receive treatment for his infantile cerebral palsy from his caregivers.  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   GERD (gastroesophageal reflux disease)    Hyperlipidemia, mild 08/25/2015   Hyperthyroidism    Incontinence of feces    Loss of weight  08/28/2014   Medicare annual wellness visit, subsequent 08/25/2015   Mildly underweight adult 03/16/2017   Palpitations    PALSY, INFANTILE CEREBRAL, 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   Qualifier: Diagnosis of  By: Jerold Coombe  Working with PMR Dr Tessa Lerner and tolerating Botox injections in hips, shoulders, etc with good results   Skin lesion of right ear 03/16/2017   Thyroid disease 08/01/2010   Qualifier: Diagnosis of  By: Harlon Ditty CMA (AAMA), Dottie      Past Surgical History:  Procedure Laterality Date   baclofen trial     baslofen pump implant     ears tubes     EYE SURGERY     FLEXIBLE SIGMOIDOSCOPY N/A 09/07/2014   Procedure: FLEXIBLE SIGMOIDOSCOPY;  Surgeon: [name], MD;  Location: [facility] ENDOSCOPY;  Service: Endoscopy;  Laterality: N/A;   g-tube insert  August 2006   hamstring released     to treat contractures.    HIP SURGERY     x2 , side    IR CM INJ ANY COLONIC TUBE W/FLUORO  05/28/2017   IR CM INJ ANY COLONIC TUBE W/FLUORO  07/07/2019   IR CM INJ ANY COLONIC TUBE W/FLUORO  09/18/2019   IR GASTR TUBE CONVERT GASTR-JEJ PER W/FL MOD SED  03/14/2019   IR GENERIC HISTORICAL  07/01/2016   IR GASTR TUBE CONVERT GASTR-JEJ PER W/FL MOD SED 07/01/2016 [name], MD [facility]   IR GENERIC HISTORICAL  07/08/2016   IR PATIENT EVAL TECH 0-60 MINS 07/08/2016 [name], MD [facility]   IR GENERIC HISTORICAL  07/14/2016   IR Beverely Low  TUBE CHANGE 07/14/2016 [name], MD [facility]   IR GENERIC HISTORICAL  07/21/2016   IR PATIENT EVAL TECH 0-60 MINS [facility]   IR GENERIC HISTORICAL  08/31/2016   IR REPLC DUODEN/JEJUNO TUBE PERCUT W/FLUORO 08/31/2016 [name], MD [facility]   IR GENERIC HISTORICAL  09/03/2016   IR GJ TUBE CHANGE 09/03/2016 [name], MD [facility]   IR GENERIC HISTORICAL  09/10/2016   IR GASTR TUBE CONVERT GASTR-JEJ PER W/FL MOD SED 09/10/2016 [facility]   IR GENERIC HISTORICAL  09/16/2016   IR PATIENT EVAL TECH 0-60 MINS [facility]   IR GENERIC  HISTORICAL  09/29/2016   IR GJ TUBE CHANGE 09/29/2016 [name], MD [facility]   IR GJ TUBE CHANGE  02/05/2017   IR GJ TUBE CHANGE  05/21/2017   IR GJ TUBE CHANGE  08/25/2017   IR GJ TUBE CHANGE  01/18/2018   IR GJ TUBE CHANGE  02/04/2018   IR GJ TUBE CHANGE  04/21/2018   IR GJ TUBE CHANGE  08/18/2018   IR GJ TUBE CHANGE  09/12/2019   IR GJ TUBE CHANGE  01/03/2020   IR GJ TUBE CHANGE  05/13/2020   IR GJ TUBE CHANGE  09/11/2020   IR GJ TUBE CHANGE  02/27/2021   IR GJ TUBE CHANGE  08/01/2021   IR GJ TUBE CHANGE  12/31/2021   IR GJ TUBE CHANGE  05/11/2022   IR GJ TUBE CHANGE  07/01/2022   IR MECH REMOV OBSTRUC MAT ANY COLON TUBE W/FLUORO  09/02/2018   IR [facility] GASTRO/COLONIC TUBE PERCUT W/FLUORO  10/17/2018   PEG PLACEMENT  10/21/2011   Procedure: PERCUTANEOUS ENDOSCOPIC GASTROSTOMY (PEG) REPLACEMENT;  Surgeon: [name], MD;  Location: [facility] ENDOSCOPY;  Service: Endoscopy;  Laterality: N/A;   PEG PLACEMENT N/A 06/13/2013   Procedure: PERCUTANEOUS ENDOSCOPIC GASTROSTOMY (PEG) REPLACEMENT;  Surgeon: [name], MD;  Location: [facility] ENDOSCOPY;  Service: Endoscopy;  Laterality: N/A;   SPINAL FUSION     spinal fusion to correct 70 degree kyphosis  11-2010   spinal fusioncorrect 106 degree kyphosis     SPINE SURGERY  ,11/20/2010, 2011   for correction of severe contracturing spinal kyphosis.    TONSILLECTOMY      Family History  Problem Relation Age of Onset   Asthma Mother    Hyperlipidemia Mother    COPD Mother    Other Mother        bronchial stasis/ABPA   Cancer Maternal Grandmother 73       breast   Hyperlipidemia Maternal Grandmother    Hypertension Maternal Grandmother    Cancer Maternal Grandfather        prostate   Heart disease Paternal Grandfather        CHF   Osteoporosis Paternal Grandmother    Arthritis Paternal Grandmother        rheumatoid    Social History   Socioeconomic History   Marital status: Single    Spouse name: Not on file   Number of children: 0    Years of education: Not on file   Highest education level: Not on file  Occupational History   Occupation: disbaled  Tobacco Use   Smoking status: Never   Smokeless tobacco: Never  Vaping Use   Vaping Use: Never used  Substance and Sexual Activity   Alcohol use: No   Drug use: No   Sexual activity: Never  Other Topics Concern   Not on file  Social History Narrative  Not on file   Social Determinants of Health   Financial Resource Strain: Not on file  Food Insecurity: Not on file  Transportation Needs: Not on file  Physical Activity: Not on file  Stress: Not on file  Social Connections: Not on file  Intimate Partner Violence: Not on file    Outpatient Medications Prior to Visit  Medication Sig Dispense Refill   chlorpheniramine-HYDROcodone (TUSSIONEX) 10-8 MG/5ML Take 5 mLs by mouth every 12 (twelve) hours as needed for cough. 180 mL 0   albuterol (PROVENTIL) (2.5 MG/3ML) 0.083% nebulizer solution Take 3 mLs (2.5 mg total) by nebulization every 6 (six) hours as needed for shortness of breath. 75 mL 5   AMBULATORY NON FORMULARY MEDICATION Medication Name: MIC gastrostomy/bolus feeding tube 24 French Part number 0110-24. #2 and 10 cc lurer lock syringe #2 Dx: 4 Device 2   ciprofloxacin (CILOXAN) 0.3 % ophthalmic solution PLACE 2 DROPS INTO THE LEFT EYE EVERY 2 HOURS FOR 5 DAYS; THEN 1 DROP EVERY 2 HOURS WHILE AWAKE FOR 2 DAYS; THEN 1 DROP EVERY 4 HOURS WHILE AWAKE FOR 5 DAYS. 5 mL 0   CYMBALTA 30 MG capsule Take 30 mg by mouth every morning.     CYMBALTA 60 MG capsule Take 60 mg by mouth every morning.     DEPAKOTE SPRINKLES 125 MG capsule Take by mouth.     diazepam (VALIUM) 2 MG tablet Take 2 mg by mouth. As needed     diazepam (VALIUM) 5 MG tablet Take 5 mg by mouth 2 (two) times daily.     HYDROcodone bit-homatropine (HYCODAN) 5-1.5 MG/5ML syrup Take 5 mLs by mouth every 6 (six) hours as needed for cough. 240 mL 0   Incontinence Supply Disposable (PREVAIL BREEZERS MEDIUM)  MISC pkg of 16- size medium 32" to 44"  Breathable cloth-like outer fabric (can't use the plastic outer surgace  Item # [id number]/[id number] 16 each 6   lamoTRIgine (LAMICTAL) 25 MG CHEW chewable tablet One every night x week One twice a day x 1 week One / 2 at night 2 tabs twice a day 120 tablet 6   Misc. Devices (ALL-BODY MASSAGE) MISC 1 Units/hr by Does not apply route as needed. Full body massage for Muscle spasticity due to Cerebral palsy 99 each 99   mupirocin ointment (BACTROBAN) 2 % Apply to area thin film twice daily if needed 22 g 0   NON FORMULARY Bard Leg Bag Extension tubing w/Connector 18", Sterile, latex-free  Item# [id number]     NON FORMULARY Colorplast Freedom Cath Latex Self-Adhering Male External Catheter 5m Diameter Intermediate  Item# [id number]    NONFORMULARY OR COMPOUNDED ITEM Cov[employer]ien REF [id number]- Kangaroo Joey Pump Set with Flush Bags - 1000 mL 1 each 0   Nutritional Supplements (FEEDING SUPPLEMENT, KATE FARMS STANDARD 1.4,) LIQD liquid Take 325 mLs by mouth as directed. 3 carton over 16 hrs     OLANZapine (ZYPREXA) 10 MG tablet Take 10 mg by mouth at bedtime.     OLANZapine (ZYPREXA) 5 MG tablet Take by mouth.     Omeprazole-Sodium Bicarbonate (ZEGERID) 20-1100 MG CAPS capsule Take 1 capsule by mouth daily before breakfast.     oseltamivir (TAMIFLU) 6 MG/ML SUSR suspension Place 12.5 mLs (75 mg total) into feeding tube 2 (two) times daily. 150 mL 0   Ostomy Supplies (PROTECTIVE BARRIER WIPES) MISC 1-1/4" X 3"  Item ##[id number] each 6   oxybutynin (DITROPAN) 5 MG tablet Take 5 mg by mouth 2 (two)  times daily.     sodium chloride HYPERTONIC 3 % nebulizer solution Take by nebulization in the morning and at bedtime. 750 mL 12   sucralfate (CARAFATE) 1 g tablet TAKE 1 TABLET BY MOUTH 4 TIMES DAILY WITH MEALS AND AT BEDTIME (Patient taking differently: as needed.) 120 tablet 1   dantrolene (DANTRIUM) 50 MG capsule TAKE 1 CAPSULE BY MOUTH EVERY MORNING, 1 CAPSULE EVERY EVENING,  AND 1 CAPSULE AT BEDTIME. 90 capsule 1   No facility-administered medications prior to visit.    Allergies  Allergen Reactions   Ambien [Zolpidem Tartrate] Nausea Only   Antihistamines, Chlorpheniramine-Type     Other reaction(s): Other (See Comments) Other Reaction: agitation   Augmentin [Amoxicillin-Pot Clavulanate] Diarrhea   Codeine Other (See Comments)    Makes patient too active after a few days.   Metoclopramide Other (See Comments)    Delusion, emotionality    Baclofen Anxiety    anxiety   Pheniramine Rash    Other reaction(s): Other (See Comments) Other Reaction: agitation   Sulfa Antibiotics Rash   Sulfonamide Derivatives Rash    ROS     Objective:    Physical Exam  There were no vitals taken for this visit. Wt Readings from Last 3 Encounters:  09/01/22 92 lb (41.7 kg)  03/26/22 92 lb (41.7 kg)  02/05/22 95 lb (43.1 kg)    Diabetic Foot Exam - Simple   No data filed    Lab Results  Component Value Date   WBC 7.3 08/07/2021   HGB 15.5 08/07/2021   HCT 45.7 08/07/2021   PLT 179.0 Repeated and verified X2. 08/07/2021   GLUCOSE 84 06/11/2022   CHOL 129 08/07/2021   TRIG 80.0 08/07/2021   HDL 37.40 (L) 08/07/2021   LDLCALC 76 08/07/2021   ALT 16 06/11/2022   AST 13 06/11/2022   NA 136 06/11/2022   K 4.2 06/11/2022   CL 98 06/11/2022   CREATININE 0.28 (L) 06/11/2022   BUN 7 06/11/2022   CO2 28 06/11/2022   TSH 1.81 08/07/2021   HGBA1C 5.0 04/29/2007    Lab Results  Component Value Date   TSH 1.81 08/07/2021   Lab Results  Component Value Date   WBC 7.3 08/07/2021   HGB 15.5 08/07/2021   HCT 45.7 08/07/2021   MCV 94.8 08/07/2021   PLT 179.0 Repeated and verified X2. 08/07/2021   Lab Results  Component Value Date   NA 136 06/11/2022   K 4.2 06/11/2022   CO2 28 06/11/2022   GLUCOSE 84 06/11/2022   BUN 7 06/11/2022   CREATININE 0.28 (L) 06/11/2022   BILITOT 0.4 06/11/2022   ALKPHOS 73 06/11/2022   AST 13 06/11/2022   ALT 16  06/11/2022   PROT 7.2 06/11/2022   ALBUMIN 4.5 06/11/2022   CALCIUM 9.1 06/11/2022   ANIONGAP 11 09/18/2014   GFR 154.44 06/11/2022   Lab Results  Component Value Date   CHOL 129 08/07/2021   Lab Results  Component Value Date   HDL 37.40 (L) 08/07/2021   Lab Results  Component Value Date   LDLCALC 76 08/07/2021   Lab Results  Component Value Date   TRIG 80.0 08/07/2021   Lab Results  Component Value Date   CHOLHDL 3 08/07/2021   Lab Results  Component Value Date   HGBA1C 5.0 04/29/2007      Assessment & Plan:   Problem List Items Addressed This Visit     Depression with anxiety - Primary    Is  in visit with his mother and new aide and is doing well at the current time      Gastrostomy in place [facility])    Will require lifelong tube feeds secondary 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       Recurrent pneumonia    He responded to Levaquin and is returned to his baseline without residual symptoms.      Urinary incontinence    Will require life long management with foley catheters due to his Quadriplegia      No orders of the defined types were placed in this encounter.  I discussed the assessment and treatment plan with the patient. The patient was provided an opportunity to ask questions and all were answered. The patient agreed with the plan and demonstrated an understanding of the instructions.   The patient was advised to call back or seek an in-person evaluation if the symptoms worsen or if the condition fails to improve as anticipated.  I provided 20 minutes of face-to-face time during this encounter.  I,Mohammed Iqbal,acting as a scribe for [name], MD.,have documented all relevant documentation on the behalf of [name], MD,as directed by  [name], MD while in the presence of [name], MD.  [name], MD [facility] at [facility] [phone] (phone) [phone] (fax)  [place]

## 2022-12-06 NOTE — Assessment & Plan Note (Signed)
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

## 2022-12-06 NOTE — Assessment & Plan Note (Signed)
Will require life long management with foley catheters due to his Quadriplegia

## 2022-12-06 NOTE — Assessment & Plan Note (Signed)
He responded to Levaquin and is returned to his baseline without residual symptoms.

## 2022-12-06 NOTE — Assessment & Plan Note (Signed)
Is in visit with his mother and new aide and is doing well at the current time

## 2022-12-20 ENCOUNTER — Ambulatory Visit (INDEPENDENT_AMBULATORY_CARE_PROVIDER_SITE_OTHER): Payer: Medicare Other

## 2022-12-20 ENCOUNTER — Ambulatory Visit
Admission: EM | Admit: 2022-12-20 | Discharge: 2022-12-20 | Disposition: A | Discharge status: Home / Self Care | Payer: Medicare Other | Attending: Urgent Care | Admitting: Urgent Care

## 2022-12-20 DIAGNOSIS — B349 Viral infection, unspecified: Secondary | ICD-10-CM | POA: Diagnosis not present

## 2022-12-20 DIAGNOSIS — R0789 Other chest pain: Secondary | ICD-10-CM

## 2022-12-20 DIAGNOSIS — R059 Cough, unspecified: Secondary | ICD-10-CM

## 2022-12-20 DIAGNOSIS — R079 Chest pain, unspecified: Secondary | ICD-10-CM | POA: Diagnosis not present

## 2022-12-20 DIAGNOSIS — G809 Cerebral palsy, unspecified: Secondary | ICD-10-CM | POA: Diagnosis not present

## 2022-12-20 MED ORDER — PREDNISONE 10 MG PO TABS: 30.0000 mg | ORAL_TABLET | Freq: Every day | ORAL | 0 refills | Status: DC

## 2022-12-20 MED ORDER — CETIRIZINE HCL 1 MG/ML PO SOLN: 10.0000 mg | Freq: Every day | ORAL | 0 refills | Status: DC

## 2022-12-20 MED ORDER — PROMETHAZINE-DM 6.25-15 MG/5ML PO SYRP: 5.0000 mL | ORAL_SOLUTION | Freq: Three times a day (TID) | ORAL | 0 refills | Status: DC | PRN

## 2022-12-20 NOTE — ED Triage Notes (Signed)
Mom states that pt has a cough and chest congestion. X1 week

## 2022-12-20 NOTE — ED Provider Notes (Signed)
[facility]  Note:  This document was prepared using Systems analyst and may include unintentional dictation errors.  MRN: [id number] DOB: 06-04-1986  Subjective:   [name] is a 37 y.o. male presenting for 1 week history of persistent and worsening chest congestion, coughing, coughing fits.  Patient's caregiver is requesting steroids to see usually responds very well to this.  She has been using over-the-counter medications for cold and also has been giving him Tussionex.  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  Has a history of cerebral palsy and esophageal dysphagia which can affect his recovery from illnesses such as what is undergoing.  No current facility-administered medications for this encounter.  Current Outpatient Medications:    albuterol (PROVENTIL) (2.5 MG/3ML) 0.083% nebulizer solution, Take 3 mLs (2.5 mg total) by nebulization every 6 (six) hours as needed for shortness of breath., Disp: 75 mL, Rfl: 5   AMBULATORY NON FORMULARY MEDICATION, Medication Name: MIC gastrostomy/bolus feeding tube 24 French Part number 0110-24. #2 and 10 cc lurer lock syringe #2 Dx:, Disp: 4 Device, Rfl: 2   chlorpheniramine-HYDROcodone (TUSSIONEX) 10-8 MG/5ML, Take 5 mLs by mouth every 12 (twelve) hours as needed for cough., Disp: 180 mL, Rfl: 0   ciprofloxacin (CILOXAN) 0.3 % ophthalmic solution, PLACE 2 DROPS INTO THE LEFT EYE EVERY 2 HOURS FOR 5 DAYS; THEN 1 DROP EVERY 2 HOURS WHILE AWAKE FOR 2 DAYS; THEN 1 DROP EVERY 4 HOURS WHILE AWAKE FOR 5 DAYS., Disp: 5 mL, Rfl: 0   CYMBALTA 30 MG capsule, Take 30 mg by mouth every morning., Disp: , Rfl:    CYMBALTA 60 MG capsule, Take 60 mg by mouth every morning., Disp: , Rfl:    dantrolene (DANTRIUM) 50 MG capsule, TAKE 1 CAPSULE BY MOUTH EVERY MORNING, 1 CAPSULE EVERY EVENING, AND 1 CAPSULE AT BEDTIME., Disp: 90 capsule, Rfl: 6   DEPAKOTE SPRINKLES 125 MG capsule, Take by mouth., Disp:  , Rfl:    diazepam (VALIUM) 2 MG tablet, Take 2 mg by mouth. As needed, Disp: , Rfl:    diazepam (VALIUM) 5 MG tablet, Take 5 mg by mouth 2 (two) times daily., Disp: , Rfl:    HYDROcodone bit-homatropine (HYCODAN) 5-1.5 MG/5ML syrup, Take 5 mLs by mouth every 6 (six) hours as needed for cough., Disp: 240 mL, Rfl: 0   Incontinence Supply Disposable (PREVAIL BREEZERS MEDIUM) MISC, pkg of 16- size medium 32" to 44"  Breathable cloth-like outer fabric (can't use the plastic outer surgace  Item # [id number], Disp: 16 each, Rfl: 6   lamoTRIgine (LAMICTAL) 25 MG CHEW chewable tablet, One every night x week One twice a day x 1 week One / 2 at night 2 tabs twice a day, Disp: 120 tablet, Rfl: 6   Misc. Devices (ALL-BODY MASSAGE) MISC, 1 Units/hr by Does not apply route as needed. Full body massage for Muscle spasticity due to Cerebral palsy, Disp: 99 each, Rfl: 99   mupirocin ointment (BACTROBAN) 2 %, Apply to area thin film twice daily if needed, Disp: 22 g, Rfl: 0   NON FORMULARY, Bard Leg Bag Extension tubing w/Connector 18", Sterile, latex-free  Item# [id number], Disp: , Rfl:    NON FORMULARY, Colorplast Freedom Cath Latex Self-Adhering Male External Catheter 37m Diameter Intermediate  Item# [id number] Disp: , Rfl:    NONFORMULARY OR COMPOUNDED ISuzy BouchardREF [id number]- Kangaroo Joey Pump Set with Flush Bags - 1000 mL, Disp: 1 each, Rfl: 0  Nutritional Supplements (FEEDING SUPPLEMENT, KATE FARMS STANDARD 1.4,) LIQD liquid, Take 325 mLs by mouth as directed. 3 carton over 16 hrs, Disp: , Rfl:    OLANZapine (ZYPREXA) 10 MG tablet, Take 10 mg by mouth at bedtime., Disp: , Rfl:    OLANZapine (ZYPREXA) 5 MG tablet, Take by mouth., Disp: , Rfl:    Omeprazole-Sodium Bicarbonate (ZEGERID) 20-1100 MG CAPS capsule, Take 1 capsule by mouth daily before breakfast., Disp: , Rfl:    oseltamivir (TAMIFLU) 6 MG/ML SUSR suspension, Place 12.5 mLs (75 mg total) into feeding tube 2 (two) times daily., Disp: 150 mL, Rfl: 0    Ostomy Supplies (PROTECTIVE BARRIER WIPES) MISC, 1-1/4" X 3"  Item [id number], Disp: 75 each, Rfl: 6   oxybutynin (DITROPAN) 5 MG tablet, Take 5 mg by mouth 2 (two) times daily., Disp: , Rfl:    sodium chloride HYPERTONIC 3 % nebulizer solution, Take by nebulization in the morning and at bedtime., Disp: 750 mL, Rfl: 12   sucralfate (CARAFATE) 1 g tablet, TAKE 1 TABLET BY MOUTH 4 TIMES DAILY WITH MEALS AND AT BEDTIME (Patient taking differently: as needed.), Disp: 120 tablet, Rfl: 1   Allergies  Allergen Reactions   Ambien [Zolpidem Tartrate] Nausea Only   Antihistamines, Chlorpheniramine-Type     Other reaction(s): Other (See Comments) Other Reaction: agitation   Augmentin [Amoxicillin-Pot Clavulanate] Diarrhea   Codeine Other (See Comments)    Makes patient too active after a few days.   Metoclopramide Other (See Comments)    Delusion, emotionality    Baclofen Anxiety    anxiety   Pheniramine Rash    Other reaction(s): Other (See Comments) Other Reaction: agitation   Sulfa Antibiotics Rash   Sulfonamide Derivatives Rash    Past Medical History:  Diagnosis Date   Cerebral palsy ([place])    Dehydration 11/22/2013   Depression with anxiety 08/01/2010   Qualifier: Diagnosis of  By: Nelson-Smith CMA (AAMA), Dottie     Dyslipidemia 08/19/2017   Esophagitis 2011   Gastrostomy in place ([place]) 08/31/2013   GERD (gastroesophageal reflux disease)    Hyperlipidemia, mild 08/25/2015   Hyperthyroidism    Incontinence of feces    Loss of weight 08/28/2014   Medicare annual wellness visit, subsequent 08/25/2015   Mildly underweight adult 03/16/2017   Palpitations    PALSY, INFANTILE CEREBRAL, 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   Qualifier: Diagnosis of  By: Jerold Coombe  Working with PMR Dr Tessa Lerner and tolerating Botox injections in hips, shoulders, etc with good results   Skin lesion of right ear 03/16/2017   Thyroid disease 08/01/2010   Qualifier: Diagnosis of  By: Harlon Ditty CMA (AAMA), Dottie        Past Surgical History:  Procedure Laterality Date   baclofen trial     baslofen pump implant     ears tubes     EYE SURGERY     FLEXIBLE SIGMOIDOSCOPY N/A 09/07/2014   Procedure: FLEXIBLE SIGMOIDOSCOPY;  Surgeon: [name], MD;  Location[facility] ENDOSCOPY;  Service: Endoscopy;  Laterality: N/A;   g-tube insert  August 2006   hamstring released     to treat contractures.    HIP SURGERY     x2 , side    IR CM INJ ANY COLONIC TUBE W/FLUORO  05/28/2017   IR CM INJ ANY COLONIC TUBE W/FLUORO  07/07/2019   IR CM INJ ANY COLONIC TUBE W/FLUORO  09/18/2019   IR GASTR TUBE CONVERT GASTR-JEJ PER W/FL MOD SED  03/14/2019   IR GENERIC HISTORICAL  07/01/2016   IR GASTR TUBE CONVERT GASTR-JEJ PER W/FL MOD SED 07/01/2016 [name], MD [facility]   IR GENERIC HISTORICAL  07/08/2016   IR PATIENT EVAL TECH 0-60 MINS 07/08/2016 [name], MD [facility]   IR GENERIC HISTORICAL  07/14/2016   IR GJ TUBE CHANGE 07/14/2016 [name], MD [facility]   IR GENERIC HISTORICAL  07/21/2016   IR PATIENT EVAL TECH 0-60 MINS [facility]   IR GENERIC HISTORICAL  08/31/2016   IR REPLC DUODEN/JEJUNO TUBE PERCUT W/FLUORO 08/31/2016 [name], MD [facility]   IR GENERIC HISTORICAL  09/03/2016   IR GJ TUBE CHANGE 09/03/2016 [name], MD [facility]   IR GENERIC HISTORICAL  09/10/2016   IR GASTR TUBE CONVERT GASTR-JEJ PER W/FL MOD SED 09/10/2016 [facility]   IR GENERIC HISTORICAL  09/16/2016   IR PATIENT EVAL TECH 0-60 MINS [facility]   IR GENERIC HISTORICAL  09/29/2016   IR GJ TUBE CHANGE 09/29/2016 [name], MD [facility]   IR GJ TUBE CHANGE  02/05/2017   IR GJ TUBE CHANGE  05/21/2017   IR GJ TUBE CHANGE  08/25/2017   IR GJ TUBE CHANGE  01/18/2018   IR GJ TUBE CHANGE  02/04/2018   IR GJ TUBE CHANGE  04/21/2018   IR GJ TUBE CHANGE  08/18/2018   IR GJ TUBE CHANGE  09/12/2019   IR GJ TUBE CHANGE  01/03/2020   IR GJ TUBE CHANGE  05/13/2020   IR GJ TUBE CHANGE  09/11/2020   IR GJ TUBE CHANGE   02/27/2021   IR GJ TUBE CHANGE  08/01/2021   IR GJ TUBE CHANGE  12/31/2021   IR GJ TUBE CHANGE  05/11/2022   IR GJ TUBE CHANGE  07/01/2022   IR MECH REMOV OBSTRUC MAT ANY COLON TUBE W/FLUORO  09/02/2018   IR [place] GASTRO/COLONIC TUBE PERCUT W/FLUORO  10/17/2018   PEG PLACEMENT  10/21/2011   Procedure: PERCUTANEOUS ENDOSCOPIC GASTROSTOMY (PEG) REPLACEMENT;  Surgeon: [name], MD;  Location: [facility];  Service: Endoscopy;  Laterality: N/A;   PEG PLACEMENT N/A 06/13/2013   Procedure: PERCUTANEOUS ENDOSCOPIC GASTROSTOMY (PEG) REPLACEMENT;  Surgeon: [name], MD;  Location: [facility] ENDOS[facility];  Service: Endoscopy;  Laterality: N/A;   SPINAL FUSION     spinal fusion to correct 70 degree kyphosis  11-2010   spinal fusioncorrect 106 degree kyphosis     SPINE SURGERY  ,11/20/2010, 2011   for correction of severe contracturing spinal kyphosis.    TONSILLECTOMY      Family History  Problem Relation Age of Onset   Asthma Mother    Hyperlipidemia Mother    COPD Mother    Other Mother        bronchial stasis/ABPA   Cancer Maternal Grandmother 28       breast   Hyperlipidemia Maternal Grandmother    Hypertension Maternal Grandmother    Cancer Maternal Grandfather        prostate   Heart disease Paternal Grandfather        CHF   Osteoporosis Paternal Grandmother    Arthritis Paternal Grandmother        rheumatoid    Social History   Tobacco Use   Smoking status: Never   Smokeless tobacco: Never  Vaping Use   Vaping Use: Never used  Substance Use Topics   Alcohol use: No   Drug use: No    ROS   Objective:   Vitals: BP (!) 134/90 (BP Location: Right  Arm)   Pulse (!) 122   Temp 98.8 F (37.1 C) (Temporal)   Resp 20   Ht 5' (1.524 m)   Wt 90 lb (40.8 kg)   SpO2 95%   BMI 17.58 kg/m   Physical Exam Constitutional:      General: He is not in acute distress.    Appearance: Normal appearance. He is well-developed. He is not ill-appearing, toxic-appearing or diaphoretic.   HENT:     Head: Normocephalic and atraumatic.     Right Ear: External ear normal.     Left Ear: External ear normal.     Nose: Nose normal.     Mouth/Throat:     Mouth: Mucous membranes are moist.  Eyes:     General: No scleral icterus.       Right eye: No discharge.        Left eye: No discharge.     Extraocular Movements: Extraocular movements intact.  Cardiovascular:     Rate and Rhythm: Normal rate and regular rhythm.     Heart sounds: Normal heart sounds. No murmur heard.    No friction rub. No gallop.  Pulmonary:     Effort: Pulmonary effort is normal. No respiratory distress.     Breath sounds: Normal breath sounds. No stridor. No wheezing, rhonchi or rales.     Comments: Persistent coughing throughout the entirety of his visit. Neurological:     Mental Status: He is alert and oriented to person, place, and time.  Psychiatric:        Mood and Affect: Mood normal.        Behavior: Behavior normal.        Thought Content: Thought content normal.     DG Chest 1 View  Result Date: 12/20/2022 CLINICAL DATA:  37 year old male with history of chest pain, cough and congestion. EXAM: CHEST  1 VIEW COMPARISON:  Chest x-ray 10/22/2022. FINDINGS: Lung volumes are normal. No consolidative airspace disease. No pleural effusions. No pneumothorax. No pulmonary nodule or mass noted. Pulmonary vasculature and the cardiomediastinal silhouette are within normal limits. Extensive orthopedic fixation hardware noted throughout the cervical, thoracic and visualized lumbar spine. IMPRESSION: No radiographic evidence of acute cardiopulmonary disease. Electronically Signed   By: Vinnie Langton M.D.   On: 12/20/2022 11:56     Assessment and Plan :   PDMP not reviewed this encounter.  1. Acute viral syndrome   2. Cerebral palsy, unspecified type ([place])     Will defer antibiotic use given the negative chest x-ray.  I was agreeable to using prednisone as patient has responded well to this in the  past and in the context of his restrictive lung disease.  Recommended supportive care otherwise. Counseled patient on potential for adverse effects with medications prescribed/recommended today, ER and return-to-clinic precautions discussed, patient verbalized understanding.    Jaynee Eagles, [place] 12/20/22 1327

## 2022-12-23 ENCOUNTER — Encounter: Payer: Self-pay | Admitting: Family Medicine

## 2022-12-25 ENCOUNTER — Other Ambulatory Visit: Payer: Self-pay | Admitting: Family

## 2022-12-25 MED ORDER — HYDROCODONE BIT-HOMATROP MBR 5-1.5 MG/5ML PO SOLN: 5.0000 mL | Freq: Four times a day (QID) | ORAL | 0 refills | Status: DC | PRN

## 2022-12-30 NOTE — Telephone Encounter (Signed)
Opened in error

## 2023-01-07 ENCOUNTER — Encounter: Payer: Self-pay | Admitting: Radiology

## 2023-01-07 DIAGNOSIS — G4700 Insomnia, unspecified: Secondary | ICD-10-CM | POA: Diagnosis not present

## 2023-01-07 DIAGNOSIS — F339 Major depressive disorder, recurrent, unspecified: Secondary | ICD-10-CM | POA: Diagnosis not present

## 2023-01-08 ENCOUNTER — Other Ambulatory Visit: Payer: Self-pay | Admitting: Internal Medicine

## 2023-01-08 NOTE — Telephone Encounter (Signed)
Per Dr Hilarie Fredrickson, patient may have refills of sucralfate.

## 2023-01-14 ENCOUNTER — Encounter: Payer: Self-pay | Admitting: Family Medicine

## 2023-01-14 ENCOUNTER — Other Ambulatory Visit: Payer: Self-pay | Admitting: Family Medicine

## 2023-01-14 MED ORDER — TOBRAMYCIN-DEXAMETHASONE 0.3-0.1 % OP SUSP: 1.0000 [drp] | Freq: Four times a day (QID) | OPHTHALMIC | 0 refills | Status: DC

## 2023-01-18 ENCOUNTER — Ambulatory Visit (INDEPENDENT_AMBULATORY_CARE_PROVIDER_SITE_OTHER): Payer: Medicare Other | Admitting: Family Medicine

## 2023-01-18 VITALS — BP 128/78 | HR 71 | Temp 98.0°F | Resp 16 | Ht 62.0 in | Wt 90.0 lb

## 2023-01-18 DIAGNOSIS — H00014 Hordeolum externum left upper eyelid: Secondary | ICD-10-CM | POA: Diagnosis not present

## 2023-01-18 DIAGNOSIS — L02412 Cutaneous abscess of left axilla: Secondary | ICD-10-CM | POA: Diagnosis not present

## 2023-01-18 MED ORDER — CEPHALEXIN 250 MG/5ML PO SUSR: 500.0000 mg | Freq: Four times a day (QID) | ORAL | 0 refills | Status: AC

## 2023-01-18 MED ORDER — MUPIROCIN 2 % EX OINT: 1.0000 | TOPICAL_OINTMENT | Freq: Every day | CUTANEOUS | 1 refills | Status: DC

## 2023-01-18 MED ORDER — CIPROFLOXACIN HCL 0.3 % OP SOLN: OPHTHALMIC | 0 refills | Status: DC

## 2023-01-18 NOTE — Assessment & Plan Note (Signed)
Switch to ciprodex drops, continue warm compresses and referred to ophthalmology for possible excision and drainage.

## 2023-01-18 NOTE — Progress Notes (Signed)
Subjective:    Patient ID: [name], male    DOB: 1985/12/22, 37 y.o.   MRN: [id number]  Chief Complaint  Patient presents with  . Eye Problem    Eye problem    HPI Patient is in today for ***  Past Medical History:  Diagnosis Date  . Cerebral palsy ([place])   . Dehydration 11/22/2013  . Depression with anxiety 08/01/2010   Qualifier: Diagnosis of  By: Nelson-Smith CMA (AAMA), Dottie    . Dyslipidemia 08/19/2017  . Esophagitis 2011  . 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 GERD (gastroesophageal reflux disease)   . Hyperlipidemia, mild 08/25/2015  . Hyperthyroidism   . Incontinence of feces   . Loss of weight 08/28/2014  . Medicare annual wellness visit, subsequent 08/25/2015  . Mildly underweight adult 03/16/2017  . Palpitations   . 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 Skin lesion of right ear 03/16/2017  . Thyroid disease 08/01/2010   Qualifier: Diagnosis of  By: Harlon Ditty CMA (AAMA), Dottie      Past Surgical History:  Procedure Laterality Date  . baclofen trial    . baslofen pump implant    . ears tubes    . EYE SURGERY    . FLEXIBLE SIGMOIDOSCOPY N/A 09/07/2014   Procedure: FLEXIBLE SIGMOIDOSCOPY;  Surgeon: [name], MD;  [facility];  Service: Endoscopy;  Laterality: N/A;  . g-tube insert  August 2006  . hamstring released     to treat contractures.   Marland Kitchen HIP SURGERY     x2 , side   . IR CM INJ ANY COLONIC TUBE W/FLUORO  05/28/2017  . IR CM INJ ANY COLONIC TUBE W/FLUORO  07/07/2019  . IR CM INJ ANY COLONIC TUBE W/FLUORO  09/18/2019  . IR GASTR TUBE CONVERT GASTR-JEJ PER W/FL MOD SED  03/14/2019  . IR GENERIC HISTORICAL  07/01/2016   IR GASTR TUBE CONVERT GASTR-JEJ PER W/FL MOD SED 07/01/2016 [name], MD [facility]INTER[facility]  . IR GENERIC HISTORICAL  07/08/2016   IR PATIENT  EVAL TECH 0-60 MINS 07/08/2016 [name], MD [facility]  . IR GENERIC HISTORICAL  07/14/2016   IR GJ TUBE CHANGE 07/14/2016 [name], MD [facility]  . IR GENERIC HISTORICAL  07/21/2016   IR PATIENT EVAL TECH 0-60 MINS [facility]  . IR GENERIC HISTORICAL  08/31/2016   IR [place] DUODEN/JEJUNO TUBE PERCUT W/FLUORO 08/31/2016 [name], MD [facility]  . IR GENERIC HISTORICAL  09/03/2016   IR GJ TUBE CHANGE 09/03/2016 [name], MD [facility]  . IR GENERIC HISTORICAL  09/10/2016   IR GASTR TUBE CONVERT GASTR-JEJ PER W/FL MOD SED 09/10/2016 [facility]  . IR GENERIC HISTORICAL  09/16/2016   IR PATIENT EVAL TECH 0-60 MINS [facility]  . IR GENERIC HISTORICAL  09/29/2016   IR GJ TUBE CHANGE 09/29/2016 [name], MD [facility]  . IR GJ TUBE CHANGE  02/05/2017  . IR GJ TUBE CHANGE  05/21/2017  . IR GJ TUBE CHANGE  08/25/2017  . IR GJ TUBE CHANGE  01/18/2018  . IR GJ TUBE CHANGE  02/04/2018  . IR GJ TUBE CHANGE  04/21/2018  . IR GJ TUBE CHANGE  08/18/2018  . IR GJ TUBE CHANGE  09/12/2019  . IR GJ TUBE CHANGE  01/03/2020  .  IR GJ TUBE CHANGE  05/13/2020  . IR GJ TUBE CHANGE  09/11/2020  . IR GJ TUBE CHANGE  02/27/2021  . IR GJ TUBE CHANGE  08/01/2021  . IR GJ TUBE CHANGE  12/31/2021  . IR GJ TUBE CHANGE  05/11/2022  . IR GJ TUBE CHANGE  07/01/2022  . IR MECH REMOV OBSTRUC MAT ANY COLON TUBE W/FLUORO  09/02/2018  . IR REPLC GASTRO/COLONIC TUBE PERCUT W/FLUORO  10/17/2018  . PEG PLACEMENT  10/21/2011   Procedure: PERCUTANEOUS ENDOSCOPIC GASTROSTOMY (PEG) REPLACEMENT;  Surgeon: [name], MD;  Location: [facility] ENDOS[facility];  Service: Endoscopy;  Laterality: N/A;  . PEG PLACEMENT N/A 06/13/2013   Procedure: PERCUTANEOUS ENDOSCOPIC GASTROSTOMY (PEG) REPLACEMENT;  Surgeon: [name], MD;  Location: [facility] ENDOS[facility];  Service: Endoscopy;  Laterality: N/A;  . SPINAL FUSION    . spinal fusion to correct 70 degree kyphosis  11-2010  . spinal fusioncorrect 106 degree kyphosis    . SPINE  SURGERY  ,11/20/2010, 2011   for correction of severe contracturing spinal kyphosis.   . TONSILLECTOMY      Family History  Problem Relation Age of Onset  . Asthma Mother   . Hyperlipidemia Mother   . COPD Mother   . Other Mother        bronchial stasis/ABPA  . Cancer Maternal Grandmother 92       breast  . Hyperlipidemia Maternal Grandmother   . Hypertension Maternal Grandmother   . Cancer Maternal Grandfather        prostate  . Heart disease Paternal Grandfather        CHF  . Osteoporosis Paternal Grandmother   . Arthritis Paternal Grandmother        rheumatoid    Social History   Socioeconomic History  . Marital status: Single    Spouse name: Not on file  . Number of children: 0  . Years of education: Not on file  . Highest education level: Not on file  Occupational History  . Occupation: disbaled  Tobacco Use  . Smoking status: Never  . Smokeless tobacco: Never  Vaping Use  . Vaping Use: Never used  Substance and Sexual Activity  . Alcohol use: No  . Drug use: No  . Sexual activity: Never  Other Topics Concern  . Not on file  Social History Narrative  . Not on file   Social Determinants of Health   Financial Resource Strain: Not on file  Food Insecurity: Not on file  Transportation Needs: Not on file  Physical Activity: Not on file  Stress: Not on file  Social Connections: Not on file  Intimate Partner Violence: Not on file    Outpatient Medications Prior to Visit  Medication Sig Dispense Refill  . albuterol (PROVENTIL) (2.5 MG/3ML) 0.083% nebulizer solution Take 3 mLs (2.5 mg total) by nebulization every 6 (six) hours as needed for shortness of breath. 75 mL 5  . AMBULATORY NON FORMULARY MEDICATION Medication Name: MIC gastrostomy/bolus feeding tube 24 French Part number 0110-24. #2 and 10 cc lurer lock syringe #2 Dx: 4 Device 2  . cetirizine HCl (ZYRTEC) 1 MG/ML solution Take 10 mLs (10 mg total) by mouth daily. 300 mL 0  .  chlorpheniramine-HYDROcodone (TUSSIONEX) 10-8 MG/5ML Take 5 mLs by mouth every 12 (twelve) hours as needed for cough. 180 mL 0  . CYMBALTA 30 MG capsule Take 30 mg by mouth every morning.    . CYMBALTA 60 MG capsule Take 60 mg by mouth every morning.    Marland Kitchen  dantrolene (DANTRIUM) 50 MG capsule TAKE 1 CAPSULE BY MOUTH EVERY MORNING, 1 CAPSULE EVERY EVENING, AND 1 CAPSULE AT BEDTIME. 90 capsule 6  . DEPAKOTE SPRINKLES 125 MG capsule Take by mouth.    . diazepam (VALIUM) 2 MG tablet Take 2 mg by mouth. As needed    . diazepam (VALIUM) 5 MG tablet Take 5 mg by mouth 2 (two) times daily.    Marland Kitchen HYDROcodone bit-homatropine (HYCODAN) 5-1.5 MG/5ML syrup Take 5 mLs by mouth every 6 (six) hours as needed for cough. 120 mL 0  . Incontinence Supply Disposable (PREVAIL BREEZERS MEDIUM) MISC pkg of 16- size medium 32" to 44"  Breathable cloth-like outer fabric (can't use the plastic outer surgace  Item # [id number] 16 each 6  . lamoTRIgine (LAMICTAL) 25 MG CHEW chewable tablet One every night x week One twice a day x 1 week One / 2 at night 2 tabs twice a day 120 tablet 6  . Misc. Devices (ALL-BODY MASSAGE) MISC 1 Units/hr by Does not apply route as needed. Full body massage for Muscle spasticity due to Cerebral palsy 99 each 99  . mupirocin ointment (BACTROBAN) 2 % Apply to area thin film twice daily if needed 22 g 0  . NON FORMULARY Bard Leg Bag Extension tubing w/Connector 18", Sterile, latex-free  Item# [id number]    . NON FORMULARY Colorplast Freedom Cath Latex Self-Adhering Male External Catheter 69mm Diameter Intermediate  Item# [id number]    . NONFORMULARY OR COMPOUNDED ITEM Covidien REF [id number] - [place] Pump Set with Flush Bags - 1000 mL 1 each 0  . Nutritional Supplements (FEEDING SUPPLEMENT, KATE FARMS STANDARD 1.4,) LIQD liquid Take 325 mLs by mouth as directed. 3 carton over 16 hrs    . OLANZapine (ZYPREXA) 10 MG tablet Take 10 mg by mouth at bedtime.    Marland Kitchen OLANZapine (ZYPREXA) 5 MG tablet Take  by mouth.    Earney Navy Bicarbonate (ZEGERID) 20-1100 MG CAPS capsule Take 1 capsule by mouth daily before breakfast.    . oseltamivir (TAMIFLU) 6 MG/ML SUSR suspension Place 12.5 mLs (75 mg total) into feeding tube 2 (two) times daily. 150 mL 0  . Ostomy Supplies (PROTECTIVE BARRIER WIPES) MISC 1-1/4" X 3"  Item ID:[id number] 75 each 6  . oxybutynin (DITROPAN) 5 MG tablet Take 5 mg by mouth 2 (two) times daily.    . predniSONE (DELTASONE) 10 MG tablet Take 3 tablets (30 mg total) by mouth daily with breakfast. 15 tablet 0  . sodium chloride HYPERTONIC 3 % nebulizer solution Take by nebulization in the morning and at bedtime. 750 mL 12  . sucralfate (CARAFATE) 1 g tablet TAKE 1 TABLET BY MOUTH 4 TIMES DAILY WITH MEALS AND AT BEDTIME 120 tablet 1  . ciprofloxacin (CILOXAN) 0.3 % ophthalmic solution PLACE 2 DROPS INTO THE LEFT EYE EVERY 2 HOURS FOR 5 DAYS; THEN 1 DROP EVERY 2 HOURS WHILE AWAKE FOR 2 DAYS; THEN 1 DROP EVERY 4 HOURS WHILE AWAKE FOR 5 DAYS. 5 mL 0  . tobramycin-dexamethasone (TOBRADEX) ophthalmic solution Place 1 drop into both eyes every 6 (six) hours. 5 mL 0   No facility-administered medications prior to visit.    Allergies  Allergen Reactions  . Ambien [Zolpidem Tartrate] Nausea Only  . Antihistamines, Chlorpheniramine-Type     Other reaction(s): Other (See Comments) Other Reaction: agitation  . Augmentin [Amoxicillin-Pot Clavulanate] Diarrhea  . Codeine Other (See Comments)    Makes patient too active after a few days.  . Metoclopramide Other (  See Comments)    Delusion, emotionality   . Baclofen Anxiety    anxiety  . Pheniramine Rash    Other reaction(s): Other (See Comments) Other Reaction: agitation  . Sulfa Antibiotics Rash  . Sulfonamide Derivatives Rash    Review of Systems  Constitutional:  Negative for fever and malaise/fatigue.  HENT:  Negative for congestion.   Eyes:  Negative for blurred vision, pain, discharge and redness.  Respiratory:   Negative for shortness of breath.   Cardiovascular:  Negative for chest pain, palpitations and leg swelling.  Gastrointestinal:  Negative for abdominal pain, blood in stool and nausea.  Genitourinary:  Negative for dysuria and frequency.  Musculoskeletal:  Negative for falls.  Skin:  Positive for rash.  Neurological:  Negative for dizziness, loss of consciousness and headaches.  Endo/Heme/Allergies:  Negative for environmental allergies.  Psychiatric/Behavioral:  Negative for depression. The patient is not nervous/anxious.        Objective:    Physical Exam Constitutional:      General: He is not in acute distress.    Appearance: Normal appearance. He is not ill-appearing or toxic-appearing.  HENT:     Head: Normocephalic and atraumatic.     Right Ear: External ear normal.     Left Ear: External ear normal.     Nose: Nose normal.  Eyes:     General:        Right eye: No discharge.        Left eye: No discharge.     Comments: Pustule on left upper eyelid with surrounding erythema and swelling  Pulmonary:     Effort: Pulmonary effort is normal.  Skin:    Findings: No rash.  Neurological:     Mental Status: He is alert and oriented to person, place, and time.  Psychiatric:        Behavior: Behavior normal.    BP 128/78 (BP Location: Right Arm, Patient Position: Sitting, Cuff Size: Small)   Pulse 71   Temp 98 F (36.7 C) (Oral)   Resp 16   Ht 5\' 2"  (1.575 m)   Wt 90 lb (40.8 kg)   SpO2 97%   BMI 16.46 kg/m  Wt Readings from Last 3 Encounters:  01/18/23 90 lb (40.8 kg)  12/20/22 90 lb (40.8 kg)  09/01/22 92 lb (41.7 kg)    Diabetic Foot Exam - Simple   No data filed    Lab Results  Component Value Date   WBC 7.3 08/07/2021   HGB 15.5 08/07/2021   HCT 45.7 08/07/2021   PLT 179.0 Repeated and verified X2. 08/07/2021   GLUCOSE 84 06/11/2022   CHOL 129 08/07/2021   TRIG 80.0 08/07/2021   HDL 37.40 (L) 08/07/2021   LDLCALC 76 08/07/2021   ALT 16 06/11/2022    AST 13 06/11/2022   NA 136 06/11/2022   K 4.2 06/11/2022   CL 98 06/11/2022   CREATININE 0.28 (L) 06/11/2022   BUN 7 06/11/2022   CO2 28 06/11/2022   TSH 1.81 08/07/2021   HGBA1C 5.0 04/29/2007    Lab Results  Component Value Date   TSH 1.81 08/07/2021   Lab Results  Component Value Date   WBC 7.3 08/07/2021   HGB 15.5 08/07/2021   HCT 45.7 08/07/2021   MCV 94.8 08/07/2021   PLT 179.0 Repeated and verified X2. 08/07/2021   Lab Results  Component Value Date   NA 136 06/11/2022   K 4.2 06/11/2022   CO2 28 06/11/2022  GLUCOSE 84 06/11/2022   BUN 7 06/11/2022   CREATININE 0.28 (L) 06/11/2022   BILITOT 0.4 06/11/2022   ALKPHOS 73 06/11/2022   AST 13 06/11/2022   ALT 16 06/11/2022   PROT 7.2 06/11/2022   ALBUMIN 4.5 06/11/2022   CALCIUM 9.1 06/11/2022   ANIONGAP 11 09/18/2014   GFR 154.44 06/11/2022   Lab Results  Component Value Date   CHOL 129 08/07/2021   Lab Results  Component Value Date   HDL 37.40 (L) 08/07/2021   Lab Results  Component Value Date   LDLCALC 76 08/07/2021   Lab Results  Component Value Date   TRIG 80.0 08/07/2021   Lab Results  Component Value Date   CHOLHDL 3 08/07/2021   Lab Results  Component Value Date   HGBA1C 5.0 04/29/2007       Assessment & Plan:  Abscess of axilla, left Assessment & Plan: Continue to use warm compresses and otherwise keep it clean and dry, suspect staph, started on Keflex 500 mg Per G tube qid and referred to general surgery for ID if does not improve  Orders: -     Ambulatory referral to General Surgery  Hordeolum externum left upper eyelid -     Ambulatory referral to Ophthalmology  Hordeolum externum of left upper eyelid Assessment & Plan: Switch to ciprodex drops, continue warm compresses and referred to ophthalmology for possible excision and drainage.    Other orders -     Mupirocin; Place 1 Application into the nose daily.  Dispense: 22 g; Refill: 1 -     Cephalexin; Place 10 mLs  (500 mg total) into feeding tube 4 (four) times daily for 10 days.  Dispense: 400 mL; Refill: 0 -     Ciprofloxacin HCl; PLACE 2 DROPS INTO THE LEFT EYE EVERY 2 HOURS FOR 5 DAYS; THEN 1 DROP EVERY 2 HOURS WHILE AWAKE FOR 2 DAYS; THEN 1 DROP EVERY 4 HOURS WHILE AWAKE FOR 5 DAYS.  Dispense: 5 mL; Refill: 0    [name], MD

## 2023-01-18 NOTE — Patient Instructions (Signed)
Stye ?A stye, also known as a hordeolum, is a bump that forms on an eyelid. It may look like a pimple next to the eyelash. A stye can form inside the eyelid (internal stye) or outside the eyelid (external stye). A stye can cause redness, swelling, and pain on the eyelid. ?Styes are very common. Anyone can get them at any age. They usually occur in just one eye at a time, but you may have more than one in either eye. ?What are the causes? ?A stye is caused by an infection. The infection is almost always caused by bacteria called Staphylococcus aureus. This is a common type of bacteria that lives on the skin. ?An internal stye may result from an infected oil-producing gland inside the eyelid. An external stye may be caused by an infection at the base of the eyelash (hair follicle). ?What increases the risk? ?You are more likely to develop a stye if: ?You have had a stye before. ?You have any of these conditions: ?Red, itchy, inflamed eyelids (blepharitis). ?A skin condition such as seborrheic dermatitis or rosacea. ?High fat levels in your blood (lipids). ?Dry eyes. ?What are the signs or symptoms? ?The most common symptom of a stye is eyelid pain. Internal styes are more painful than external styes. Other symptoms may include: ?Painful swelling of your eyelid. ?A scratchy feeling in your eye. ?Tearing and redness of your eye. ?A pimple-like bump on the edge of the eyelid. ?Pus draining from the stye. ?How is this diagnosed? ?Your health care provider may be able to diagnose a stye just by examining your eye. The health care provider may also check to make sure: ?You do not have a fever or other signs of a more serious infection. ?The infection has not spread to other parts of your eye or areas around your eye. ?How is this treated? ?Most styes will clear up in a few days without treatment or with warm compresses applied to the area. You may need to use antibiotic drops or ointment to treat an infection. Sometimes,  steroid drops or ointment are used in addition to antibiotics. ?In some cases, your health care provider may give you a small steroid injection in the eyelid. ?If your stye does not heal with routine treatment, your health care provider may drain pus from the stye using a thin blade or needle. This may be done if the stye is large, causing a lot of pain, or affecting your vision. ?Follow these instructions at home: ?Take over-the-counter and prescription medicines only as told by your health care provider. This includes eye drops or ointments. ?If you were prescribed an antibiotic medicine, steroid medicine, or both, apply or use them as told by your health care provider. Do not stop using the medicine even if your condition improves. ?Apply a warm, wet cloth (warm compress) to your eye for 5-10 minutes, 4 to 6 times a day. ?Clean the affected eyelid as directed by your health care provider. ?Do not wear contact lenses or eye makeup until your stye has healed and your health care provider says that it is safe. ?Do not try to pop or drain the stye. ?Do not rub your eye. ?Contact a health care provider if: ?You have chills or a fever. ?Your stye does not go away after several days. ?Your stye affects your vision. ?Your eyeball becomes swollen, red, or painful. ?Get help right away if: ?You have pain when moving your eye around. ?Summary ?A stye is a bump that forms   on an eyelid. It may look like a pimple next to the eyelash. ?A stye can form inside the eyelid (internal stye) or outside the eyelid (external stye). A stye can cause redness, swelling, and pain on the eyelid. ?Your health care provider may be able to diagnose a stye just by examining your eye. ?Apply a warm, wet cloth (warm compress) to your eye for 5-10 minutes, 4 to 6 times a day. ?This information is not intended to replace advice given to you by your health care provider. Make sure you discuss any questions you have with your health care  provider. ?Document Revised: 12/25/2020 Document Reviewed: 12/25/2020 ?Elsevier Patient Education ? 2023 Elsevier Inc. ? ?

## 2023-01-18 NOTE — Assessment & Plan Note (Signed)
Continue to use warm compresses and otherwise keep it clean and dry, suspect staph, started on Keflex 500 mg Per G tube qid and referred to general surgery for ID if does not improve

## 2023-01-27 ENCOUNTER — Other Ambulatory Visit (HOSPITAL_COMMUNITY): Payer: Self-pay | Admitting: Radiology

## 2023-01-27 DIAGNOSIS — R633 Feeding difficulties, unspecified: Secondary | ICD-10-CM

## 2023-01-28 ENCOUNTER — Ambulatory Visit (HOSPITAL_COMMUNITY)
Admission: RE | Admit: 2023-01-28 | Discharge: 2023-01-28 | Disposition: A | Discharge status: Home / Self Care | Payer: Medicare Other | Source: Ambulatory Visit | Attending: Radiology | Admitting: Radiology

## 2023-01-28 DIAGNOSIS — K9423 Gastrostomy malfunction: Secondary | ICD-10-CM | POA: Diagnosis not present

## 2023-01-28 DIAGNOSIS — R633 Feeding difficulties, unspecified: Secondary | ICD-10-CM | POA: Insufficient documentation

## 2023-01-28 DIAGNOSIS — K9413 Enterostomy malfunction: Secondary | ICD-10-CM | POA: Diagnosis not present

## 2023-01-28 HISTORY — PX: IR GJ TUBE CHANGE: IMG1440

## 2023-01-28 MED ORDER — LIDOCAINE VISCOUS HCL 2 % MT SOLN
OROMUCOSAL | Status: AC
  Filled 2023-01-28: qty 15

## 2023-01-28 MED ORDER — IOHEXOL 300 MG/ML  SOLN
50.0000 mL | Freq: Once | INTRAMUSCULAR | Status: AC | PRN
  Administered 2023-01-28: 20 mL

## 2023-01-28 MED ORDER — IOHEXOL 300 MG/ML  SOLN
50.0000 mL | Freq: Once | INTRAMUSCULAR | Status: AC | PRN
  Administered 2023-01-28: 15 mL

## 2023-01-28 NOTE — Procedures (Signed)
Interventional Radiology Procedure:   Indications: Leaking GJ tube  Procedure: Exchange of GJ tube  Findings: New 24 Fr GJ tube placed.  J tube was shortened.  Tip in small bowel and ready to be used.   Complications: None     EBL: Minimal  Plan: GJ tube is ready to be used.   Lola Lofaro R. Anselm Pancoast, MD  Pager: [phone]

## 2023-02-15 ENCOUNTER — Encounter: Payer: Self-pay | Admitting: *Deleted

## 2023-02-24 ENCOUNTER — Telehealth: Payer: Managed Care, Other (non HMO) | Admitting: Family Medicine

## 2023-02-25 ENCOUNTER — Ambulatory Visit: Payer: Medicare Other | Admitting: Family Medicine

## 2023-03-02 ENCOUNTER — Ambulatory Visit: Payer: Medicare Other | Admitting: Family Medicine

## 2023-03-10 ENCOUNTER — Telehealth: Payer: Self-pay | Admitting: Student

## 2023-03-10 ENCOUNTER — Encounter: Payer: Medicare Other | Attending: Physical Medicine & Rehabilitation | Admitting: Physical Medicine & Rehabilitation

## 2023-03-10 ENCOUNTER — Encounter: Payer: Self-pay | Admitting: Physical Medicine & Rehabilitation

## 2023-03-10 VITALS — BP 111/74 | HR 82 | Temp 98.3°F | Ht 62.0 in

## 2023-03-10 DIAGNOSIS — K9423 Gastrostomy malfunction: Secondary | ICD-10-CM

## 2023-03-10 DIAGNOSIS — G800 Spastic quadriplegic cerebral palsy: Secondary | ICD-10-CM | POA: Diagnosis not present

## 2023-03-10 DIAGNOSIS — G4700 Insomnia, unspecified: Secondary | ICD-10-CM | POA: Diagnosis not present

## 2023-03-10 DIAGNOSIS — F339 Major depressive disorder, recurrent, unspecified: Secondary | ICD-10-CM | POA: Diagnosis not present

## 2023-03-10 MED ORDER — ONABOTULINUMTOXINA 100 UNITS IJ SOLR
600.0000 [IU] | Freq: Once | INTRAMUSCULAR | Status: AC
  Administered 2023-03-10: 600 [IU] via INTRAMUSCULAR

## 2023-03-10 MED ORDER — SODIUM CHLORIDE (PF) 0.9 % IJ SOLN
6.0000 mL | Freq: Once | INTRAMUSCULAR | Status: AC
  Administered 2023-03-10: 6 mL via INTRAVENOUS

## 2023-03-10 NOTE — Patient Instructions (Signed)
ALWAYS FEEL FREE TO CALL OUR OFFICE WITH ANY PROBLEMS OR QUESTIONS ([phone])  **PLEASE NOTE** ALL MEDICATION REFILL REQUESTS (INCLUDING CONTROLLED SUBSTANCES) NEED TO BE MADE AT LEAST 7 DAYS PRIOR TO REFILL BEING DUE. ANY REFILL REQUESTS INSIDE THAT TIME FRAME MAY RESULT IN DELAYS IN RECEIVING YOUR PRESCRIPTION.                    

## 2023-03-10 NOTE — Addendum Note (Signed)
Addended by: Janean Sark on: 03/10/2023 12:58 PM   Modules accepted: Orders

## 2023-03-10 NOTE — Progress Notes (Signed)
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 This includes bleeding, bruising, infection, excessive weakness, or medication side effects. A REMS form is on file and signed.  Needle: 50mm injectable monopolar needle electrode   BILATERAL Number of units per muscle  Quadriceps 300 units left and 300 units right, 6 access pts each Gastroc/soleus 0 units Hamstrings 0 units Tibialis Posterior 0 units Tibialis Anterior 0 units EHL 0 units All injections were done after obtaining appropriate EMG activity and after negative drawback for blood. The patient tolerated the procedure well. Post procedure instructions were given. Return in about 3 months (around 06/10/2023) for 600 U BOTOX BILATERAL WRIST AND FINGER FLEXORS.

## 2023-03-10 NOTE — Telephone Encounter (Signed)
[name], Tawon Aurich, called regarding gastrostomy tube malfunction. Patient has existing GJ tube, and Ms Prevot reports progressive difficulty with administering medication through the gastrostomy tube portion over the last week. She states that the patient's medication has become increasingly difficult to flush through the tube, and that over the past day or two, the patient has been experiencing pain when medication is administered. Patient has subsequently been scheduled for GJ tube evaluation with possible exchange for 03/11/23.  Kennieth Francois, PA-C 03/10/2023

## 2023-03-11 ENCOUNTER — Other Ambulatory Visit (HOSPITAL_COMMUNITY): Payer: Medicare Other

## 2023-03-15 ENCOUNTER — Ambulatory Visit (INDEPENDENT_AMBULATORY_CARE_PROVIDER_SITE_OTHER): Payer: Medicare Other | Admitting: Family Medicine

## 2023-03-15 ENCOUNTER — Telehealth: Payer: Self-pay | Admitting: Family Medicine

## 2023-03-15 ENCOUNTER — Encounter: Payer: Self-pay | Admitting: Family Medicine

## 2023-03-15 ENCOUNTER — Other Ambulatory Visit: Payer: Self-pay | Admitting: Family Medicine

## 2023-03-15 VITALS — BP 128/78 | HR 57 | Temp 97.0°F | Ht 60.0 in | Wt 89.4 lb

## 2023-03-15 DIAGNOSIS — L03114 Cellulitis of left upper limb: Secondary | ICD-10-CM | POA: Diagnosis not present

## 2023-03-15 DIAGNOSIS — S51002A Unspecified open wound of left elbow, initial encounter: Secondary | ICD-10-CM

## 2023-03-15 MED ORDER — DOXYCYCLINE HYCLATE 100 MG PO TABS: 100.0000 mg | ORAL_TABLET | Freq: Two times a day (BID) | ORAL | 0 refills | Status: AC

## 2023-03-15 MED ORDER — DOXYCYCLINE CALCIUM 50 MG/5ML PO SYRP: 100.0000 mg | ORAL_SOLUTION | Freq: Two times a day (BID) | ORAL | 0 refills | Status: DC

## 2023-03-15 NOTE — Progress Notes (Signed)
Chief Complaint  Patient presents with   pressure sore    [name] is a 37 y.o. male here for a skin complaint. Here w mom who helps w hx.   Duration: a few days Location: Left elbow Pruritic? No Painful? Yes Drainage? No Fevers?  Yes- low grade Other associated symptoms: redness that is spreading, wound is getting bigger History of cerebral palsy with contractures and wheelchair-bound.  He puts a lot of pressure on his left elbow. 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   GERD (gastroesophageal reflux disease)    Hyperlipidemia, mild 08/25/2015   Hyperthyroidism    Incontinence of feces    Loss of weight 08/28/2014   Medicare annual wellness visit, subsequent 08/25/2015   Mildly underweight adult 03/16/2017   Palpitations    PALSY, INFANTILE CEREBRAL, 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 + Excessive warmth, erythema, TTP; no drainage, fluctuance, excoriation Psych: Age appropriate judgment and insight    Cellulitis of left upper extremity - Plan: doxycycline (VIBRAMYCIN) 50 MG/5ML SYRP  Open wound of left elbow, initial encounter  Liquid  doxycycline as he needs to take it through the G-tube.  7-day course.  Need to offload the elbow.  Given his high use of his upper extremity, will recommend using thicker dressings.  Send message if not improving in the next several days. F/u prn. The patient's parents voiced understanding and agreement to the plan.  [name] [place], DO 03/15/23 2:14 PM

## 2023-03-15 NOTE — Telephone Encounter (Signed)
[name], pt's mom, called to confirm it is okay to get the pill form

## 2023-03-15 NOTE — Telephone Encounter (Signed)
Called left detailed message pill form sent in

## 2023-03-15 NOTE — Telephone Encounter (Signed)
Judeth Cornfield from [facility] called to advise that they cannot get the doxycycline syrp in. She said it looks like the patient has used the 100 mg tablets in the past and it was put it in the feeding tube. Please send in the refill for the tablets or call the pharmacy to advise. [phone]

## 2023-03-15 NOTE — Patient Instructions (Signed)
We need to offload the elbow. Use lots of gauze to form a pillow.   After the 7 days, please transition to using triple antibiotic ointment/Neosporin.   Ice/cold pack over area for 10-15 min twice daily.  When you do wash it, use only soap and water. Do not vigorously scrub. Keep the area clean and dry.   Things to look out for: increasing pain not relieved by ibuprofen/acetaminophen, fevers >100.4 F on the antibiotic, spreading redness, drainage of pus, or foul odor.  Let us know if you need anything.

## 2023-03-16 ENCOUNTER — Telehealth: Payer: Medicare Other | Admitting: Family Medicine

## 2023-03-17 ENCOUNTER — Encounter: Payer: Self-pay | Admitting: Family Medicine

## 2023-03-18 ENCOUNTER — Encounter: Payer: Self-pay | Admitting: Family Medicine

## 2023-03-18 ENCOUNTER — Other Ambulatory Visit: Payer: Self-pay

## 2023-03-18 DIAGNOSIS — G800 Spastic quadriplegic cerebral palsy: Secondary | ICD-10-CM

## 2023-03-18 DIAGNOSIS — Z931 Gastrostomy status: Secondary | ICD-10-CM

## 2023-03-24 ENCOUNTER — Telehealth: Payer: Self-pay

## 2023-03-24 NOTE — Telephone Encounter (Signed)
Called pt mother Bonita Quin and see if she could help  With forms. Bonita Quin ask if I would mail a copy of form  To her she maybe able to answer the question. Pt mom stated it regarding new syringe and supplies.Mailed form it her.

## 2023-03-25 ENCOUNTER — Encounter: Payer: Self-pay | Admitting: Family Medicine

## 2023-03-25 NOTE — Telephone Encounter (Signed)
error 

## 2023-03-26 ENCOUNTER — Ambulatory Visit (INDEPENDENT_AMBULATORY_CARE_PROVIDER_SITE_OTHER): Payer: Medicare Other | Admitting: Family Medicine

## 2023-03-26 ENCOUNTER — Emergency Department (HOSPITAL_BASED_OUTPATIENT_CLINIC_OR_DEPARTMENT_OTHER)
Admission: EM | Admit: 2023-03-26 | Discharge: 2023-03-26 | Disposition: A | Discharge status: Home / Self Care | Payer: Medicare Other | Attending: Emergency Medicine | Admitting: Emergency Medicine

## 2023-03-26 ENCOUNTER — Other Ambulatory Visit: Payer: Self-pay

## 2023-03-26 ENCOUNTER — Encounter (HOSPITAL_BASED_OUTPATIENT_CLINIC_OR_DEPARTMENT_OTHER): Payer: Self-pay

## 2023-03-26 ENCOUNTER — Emergency Department (HOSPITAL_BASED_OUTPATIENT_CLINIC_OR_DEPARTMENT_OTHER): Payer: Medicare Other

## 2023-03-26 VITALS — BP 126/89 | HR 130 | Temp 97.4°F

## 2023-03-26 DIAGNOSIS — J90 Pleural effusion, not elsewhere classified: Secondary | ICD-10-CM | POA: Diagnosis not present

## 2023-03-26 DIAGNOSIS — R051 Acute cough: Secondary | ICD-10-CM

## 2023-03-26 DIAGNOSIS — J168 Pneumonia due to other specified infectious organisms: Secondary | ICD-10-CM | POA: Diagnosis not present

## 2023-03-26 DIAGNOSIS — D72829 Elevated white blood cell count, unspecified: Secondary | ICD-10-CM | POA: Insufficient documentation

## 2023-03-26 DIAGNOSIS — Z1152 Encounter for screening for COVID-19: Secondary | ICD-10-CM | POA: Insufficient documentation

## 2023-03-26 DIAGNOSIS — R0602 Shortness of breath: Secondary | ICD-10-CM | POA: Diagnosis not present

## 2023-03-26 DIAGNOSIS — R000 Tachycardia, unspecified: Secondary | ICD-10-CM | POA: Diagnosis not present

## 2023-03-26 DIAGNOSIS — R079 Chest pain, unspecified: Secondary | ICD-10-CM | POA: Diagnosis not present

## 2023-03-26 DIAGNOSIS — J189 Pneumonia, unspecified organism: Secondary | ICD-10-CM

## 2023-03-26 LAB — CBC WITH DIFFERENTIAL/PLATELET
Abs Immature Granulocytes: 0.06 10*3/uL (ref 0.00–0.07)
Basophils Absolute: 0 10*3/uL (ref 0.0–0.1)
Basophils Relative: 0 %
Eosinophils Absolute: 0 10*3/uL (ref 0.0–0.5)
Eosinophils Relative: 0 %
HCT: 39.4 % (ref 39.0–52.0)
Hemoglobin: 13.2 g/dL (ref 13.0–17.0)
Immature Granulocytes: 0 %
Lymphocytes Relative: 9 %
Lymphs Abs: 1.5 10*3/uL (ref 0.7–4.0)
MCH: 31.7 pg (ref 26.0–34.0)
MCHC: 33.5 g/dL (ref 30.0–36.0)
MCV: 94.5 fL (ref 80.0–100.0)
Monocytes Absolute: 0.7 10*3/uL (ref 0.1–1.0)
Monocytes Relative: 4 %
Neutro Abs: 13.8 10*3/uL — ABNORMAL HIGH (ref 1.7–7.7)
Neutrophils Relative %: 87 %
Platelets: 177 10*3/uL (ref 150–400)
RBC: 4.17 MIL/uL — ABNORMAL LOW (ref 4.22–5.81)
RDW: 12.4 % (ref 11.5–15.5)
WBC: 16.1 10*3/uL — ABNORMAL HIGH (ref 4.0–10.5)
nRBC: 0 % (ref 0.0–0.2)

## 2023-03-26 LAB — BASIC METABOLIC PANEL
Anion gap: 9 (ref 5–15)
BUN: 8 mg/dL (ref 6–20)
CO2: 28 mmol/L (ref 22–32)
Calcium: 8.7 mg/dL — ABNORMAL LOW (ref 8.9–10.3)
Chloride: 97 mmol/L — ABNORMAL LOW (ref 98–111)
Creatinine, Ser: 0.33 mg/dL — ABNORMAL LOW (ref 0.61–1.24)
GFR, Estimated: >60 mL/min (ref >60)
Glucose, Bld: 100 mg/dL — ABNORMAL HIGH (ref 70–99)
Potassium: 3.5 mmol/L (ref 3.5–5.1)
Sodium: 134 mmol/L — ABNORMAL LOW (ref 135–145)

## 2023-03-26 LAB — LACTIC ACID, PLASMA: Lactic Acid, Venous: 0.8 mmol/L (ref 0.5–1.9)

## 2023-03-26 LAB — SARS CORONAVIRUS 2 BY RT PCR: SARS Coronavirus 2 by RT PCR: NEGATIVE

## 2023-03-26 MED ORDER — ALBUTEROL SULFATE (5 MG/ML) 0.5% IN NEBU: 2.5000 mg | INHALATION_SOLUTION | Freq: Four times a day (QID) | RESPIRATORY_TRACT | 12 refills | Status: DC | PRN

## 2023-03-26 MED ORDER — ALBUTEROL SULFATE HFA 108 (90 BASE) MCG/ACT IN AERS: 2.0000 | INHALATION_SPRAY | RESPIRATORY_TRACT | Status: DC | PRN

## 2023-03-26 MED ORDER — SODIUM CHLORIDE 0.9 % IV BOLUS
1000.0000 mL | Freq: Once | INTRAVENOUS | Status: AC
  Administered 2023-03-26: 1000 mL via INTRAVENOUS

## 2023-03-26 MED ORDER — KETOROLAC TROMETHAMINE 30 MG/ML IJ SOLN
30.0000 mg | Freq: Once | INTRAMUSCULAR | Status: AC
  Administered 2023-03-26: 30 mg via INTRAVENOUS
  Filled 2023-03-26: qty 1

## 2023-03-26 MED ORDER — PREDNISONE 5 MG/5ML PO SOLN: 10.0000 mg | Freq: Every day | ORAL | 0 refills | Status: AC

## 2023-03-26 MED ORDER — CEFDINIR 250 MG/5ML PO SUSR: 300.0000 mg | Freq: Two times a day (BID) | ORAL | 0 refills | Status: AC

## 2023-03-26 NOTE — Discharge Instructions (Signed)
Take antibiotic and steroids as directed.  Use albuterol nebulizer solution as prescribed.  Follow-up with primary doctor for reevaluation.  If patient has any worsening symptoms, severe chest pain, difficulty breathing please return to emergency department for reevaluation.

## 2023-03-26 NOTE — ED Notes (Signed)
D/c paperwork reviewed with pts family at bedside, including prescriptions and follow up care.  All questions and/or concerns addressed at time of d/c.  No further needs expressed. . Pt verbalized understanding, Wheeled by family in personal electronic wheelchair to ED exit, NAD, on RA.

## 2023-03-26 NOTE — ED Triage Notes (Signed)
Patient has had congestion and shortness of breath since Wednesday. Patient came from his PCP on o2. He is not normally on o2. His heart rate has been increasing over the last few days.

## 2023-03-26 NOTE — Progress Notes (Signed)
   Acute Office Visit  Subjective:     Patient ID: [name], male    DOB: 04-29-1986, 37 y.o.   MRN: [id number]  Chief Complaint  Patient presents with   Cough     Patient is in today for cough. He is here with his mom and a caregiver/aid.   Mom reports that patient was recently struggling with changing from Cymbalta to Lexapro (due to feeding tube changes). States that he was getting very agitated and crying a lot. Mom thinks he may have aspirated. She reports that about 3 days ago he started having some wheezing, chest "rattling," shallow breathing, rapid respiratory rate, HR 130-140s, low grade temps responding to tylenol, productive cough with thick colored sputum. She reports he has been using is electronic communication device asking to "go to the hospital," "call 911," "get EKG." She reports it is very hard on him and the family when he is in the hospital, so she wanted to see if this could be addressed outpatient. He has been refusing his breathing treatments and not getting full tube feedings as usual.     All review of systems negative except what is listed in the HPI       Objective:    BP 126/89   Pulse (!) 130   Temp (!) 97.4 F (36.3 C) (Oral)   SpO2 93%    Physical Exam Vitals reviewed.  Constitutional:      Appearance: He is ill-appearing.  HENT:     Head: Normocephalic and atraumatic.  Cardiovascular:     Rate and Rhythm: Tachycardia present.  Pulmonary:     Comments: Pt loudly moaning during exam, difficult to assess breath sounds Skin:    Comments: clammy  Neurological:     Mental Status: He is alert. Mental status is at baseline.     Comments: Nonverbal, but using electronic device for some communication, requesting to go to hospital     No results found for any visits on 03/26/23.      Assessment & Plan:   Problem List Items Addressed This Visit   None Visit Diagnoses     Acute cough    -  Primary Thorough discussion with mom.  Advised that given his symptoms, presentation, tachycardia, he needed to be evaluated in the ED for prompt workup and management. She is hesitant but agreeable. Report called to EDP. Escorted downstairs to [facility] ED by CMA.          No orders of the defined types were placed in this encounter.   No follow-ups on file.  Clayborne Dana, NP

## 2023-03-26 NOTE — Progress Notes (Signed)
RT assessed patient on RA when we got him on room 6. SAT 96% on RA

## 2023-03-26 NOTE — ED Notes (Signed)
Pt in imaging

## 2023-03-26 NOTE — Progress Notes (Signed)
Patient sent from doctor on o2. Currently 98% on 2L. BBS rhonchi with congested cough. No distress noted

## 2023-03-26 NOTE — ED Provider Notes (Addendum)
[place] [facility]GENCY DEPARTMENT AT [facility] Provider Note   CSN: [id number] Arrival date & time: 03/26/23  1210     History  Chief Complaint  Patient presents with   Shortness of Breath    [name] is a 37 y.o. male.  HPI   37 year old male with past medical history of cerebral palsy with feeding tube in place presents to the emergency department accompanied by mom who is his primary caregiver for concern of tachycardia, chest pain and wet cough.  Prior to the gastrotomy tube patient was prone to aspiration pneumonia.  Mom denies any fever at home.  She states he has been complaining of chest pain and the cough sounds wet but has not produced any phlegm.  No history of blood clots, no swelling of his lower extremities.  Home Medications Prior to Admission medications   Medication Sig Start Date End Date Taking? Authorizing Provider  albuterol (PROVENTIL) (2.5 MG/3ML) 0.083% nebulizer solution Take 3 mLs (2.5 mg total) by nebulization every 6 (six) hours as needed for shortness of breath. 03/03/22   Clayborne Dana, NP  AMBULATORY NON FORMULARY MEDICATION Medication Name: MIC gastrostomy/bolus feeding tube 24 French Part number 0110-24. #2 and 10 cc lurer lock syringe #2 Dx: 10/15/15   Pyrtle, Carie Caddy, MD  cetirizine HCl (ZYRTEC) 1 MG/ML solution Take 10 mLs (10 mg total) by mouth daily. 12/20/22   Wallis Bamberg, PA-C  chlorpheniramine-HYDROcodone (TUSSIONEX) 10-8 MG/5ML Take 5 mLs by mouth every 12 (twelve) hours as needed for cough. 10/22/22   Bradd Canary, MD  ciprofloxacin (CILOXAN) 0.3 % ophthalmic solution PLACE 2 DROPS INTO THE LEFT EYE EVERY 2 HOURS FOR 5 DAYS; THEN 1 DROP EVERY 2 HOURS WHILE AWAKE FOR 2 DAYS; THEN 1 DROP EVERY 4 HOURS WHILE AWAKE FOR 5 DAYS. 01/18/23   Bradd Canary, MD  dantrolene (DANTRIUM) 50 MG capsule TAKE 1 CAPSULE BY MOUTH EVERY MORNING, 1 CAPSULE EVERY EVENING, AND 1 CAPSULE AT BEDTIME. 12/01/22   Ranelle Oyster, MD  DEPAKOTE  SPRINKLES 125 MG capsule Take by mouth. 06/13/21   [provider]  diazepam (VALIUM) 2 MG tablet Take 2 mg by mouth. As needed 01/28/22   [provider]  diazepam (VALIUM) 5 MG tablet Take 5 mg by mouth 2 (two) times daily. 01/30/22   [provider]  escitalopram (LEXAPRO) 5 MG/5ML solution Place 20 mLs into feeding tube daily. 03/24/23   [provider]  HYDROcodone bit-homatropine (HYCODAN) 5-1.5 MG/5ML syrup Take 5 mLs by mouth every 6 (six) hours as needed for cough. 12/25/22   Worthy Rancher B, FNP  Incontinence Supply Disposable (PREVAIL BREEZERS MEDIUM) MISC pkg of 16- size medium 32" to 44"  Breathable cloth-like outer fabric (can't use the plastic outer surgace  Item # [id number] 07/30/14   Bradd Canary, MD  lamoTRIgine (LAMICTAL) 25 MG CHEW chewable tablet One every night x week One twice a day x 1 week One / 2 at night 2 tabs twice a day 08/18/22   Levert Feinstein, MD  LORazepam (ATIVAN) 1 MG tablet Take 1 mg by mouth at bedtime. 02/24/23   [provider]  Misc. Devices (ALL-BODY MASSAGE) MISC 1 Units/hr by Does not apply route as needed. Full body massage for Muscle spasticity due to Cerebral palsy 03/14/19   Bradd Canary, MD  mupirocin ointment Idelle Jo) 2 % Apply to area thin film twice daily if needed 09/02/16   Saguier, Ramon Dredge, PA-C  NON FORMULARY  Bard Leg Bag Extension tubing w/Connector 18", Sterile, latex-free  Item# [id number]    [provider]  NON FORMULARY Colorplast Freedom Cath Latex Self-Adhering Male External Catheter 31mm Diameter Intermediate  Item# [id number]    [provider]  NONFORMULARY OR COMPOUNDED Audelia Hives REF [id number] - Kangaroo Joey Pump Set with Flush Bags - 1000 mL 06/04/15   Bradd Canary, MD  Nutritional Supplements (FEEDING SUPPLEMENT, KATE FARMS STANDARD 1.4,) LIQD liquid Take 325 mLs by mouth as directed. 3 carton over 16 hrs    [provider]  OLANZapine (ZYPREXA) 10 MG tablet  Take 10 mg by mouth at bedtime. 06/11/22   [provider]  OLANZapine (ZYPREXA) 5 MG tablet Take by mouth. 02/12/21   [provider]  Omeprazole-Sodium Bicarbonate (ZEGERID) 20-1100 MG CAPS capsule Take 1 capsule by mouth daily before breakfast.    [provider]  oseltamivir (TAMIFLU) 6 MG/ML SUSR suspension Place 12.5 mLs (75 mg total) into feeding tube 2 (two) times daily. 10/21/22   Sharlene Dory, DO  Ostomy Supplies (PROTECTIVE BARRIER WIPES) MISC 1-1/4" X 3"  Item #[id number] 07/30/14   Bradd Canary, MD  oxybutynin (DITROPAN) 5 MG tablet Take 5 mg by mouth 2 (two) times daily. 06/02/22   [provider]  predniSONE (DELTASONE) 10 MG tablet Take 3 tablets (30 mg total) by mouth daily with breakfast. 12/20/22   Wallis Bamberg, PA-C  sodium chloride HYPERTONIC 3 % nebulizer solution Take by nebulization in the morning and at bedtime. 04/14/22   Mannam, Colbert Coyer, MD  sucralfate (CARAFATE) 1 g tablet TAKE 1 TABLET BY MOUTH 4 TIMES DAILY WITH MEALS AND AT BEDTIME 01/08/23   Pyrtle, Carie Caddy, MD  traZODone (DESYREL) 100 MG tablet Take 100 mg by mouth at bedtime. 01/28/23   [provider]      Allergies    Ambien [zolpidem tartrate]; Antihistamines, chlorpheniramine-type; Augmentin [amoxicillin-pot clavulanate]; Codeine; Zolpidem; Baclofen; Metoclopramide; Pheniramine; Sulfa antibiotics; and Sulfonamide derivatives    Review of Systems   Review of Systems  Unable to perform ROS: Patient nonverbal    Physical Exam Updated Vital Signs BP 117/80   Pulse (!) 106   Temp 99.1 F (37.3 C) (Temporal)   Resp (!) 26   Ht 5' (1.524 m)   Wt 40.5 kg   SpO2 96%   BMI 17.44 kg/m  Physical Exam Vitals and nursing note reviewed.  Constitutional:      General: He is not in acute distress.    Appearance: Normal appearance.  HENT:     Head: Normocephalic.     Mouth/Throat:     Mouth: Mucous membranes are moist.  Cardiovascular:     Rate and Rhythm:  Tachycardia present.  Pulmonary:     Effort: Pulmonary effort is normal. Tachypnea present. No accessory muscle usage or respiratory distress.     Breath sounds: Examination of the right-lower field reveals decreased breath sounds. Examination of the left-lower field reveals decreased breath sounds. Decreased breath sounds and rhonchi present.  Abdominal:     Palpations: Abdomen is soft.     Tenderness: There is no abdominal tenderness.  Musculoskeletal:     Right lower leg: No edema.     Left lower leg: No edema.  Skin:    General: Skin is warm.  Neurological:     Mental Status: He is alert and oriented to person, place, and time. Mental status is at baseline.  Psychiatric:        Mood and  Affect: Mood normal.     ED Results / Procedures / Treatments   Labs (all labs ordered are listed, but only abnormal results are displayed) Labs Reviewed  SARS CORONAVIRUS 2 BY RT PCR  CBC WITH DIFFERENTIAL/PLATELET  BASIC METABOLIC PANEL    EKG EKG Interpretation  Date/Time:  Friday Mar 26 2023 12:25:40 EDT Ventricular Rate:  110 PR Interval:  116 QRS Duration: 77 QT Interval:  316 QTC Calculation: 428 R Axis:   94 Text Interpretation: Sinus tachycardia Multiple ventricular premature complexes Aberrant complex Probable left atrial enlargement Borderline right axis deviation ST elev, probable normal early repol pattern Similar to previous Confirmed by Coralee Pesa [phone]) on 03/26/2023 1:12:08 PM  Radiology DG Chest 2 View  Result Date: 03/26/2023 CLINICAL DATA:  Shortness of breath EXAM: CHEST - 2 VIEW COMPARISON:  Previous studies including the examination of 12/20/2022 FINDINGS: Cardiac size is within normal limits. There are no signs of alveolar pulmonary edema. There is blunting of right lateral CP angle. Increased markings are seen in right lower lung field. Rest of the lung fields are clear. Left lateral CP angle is clear. There is no pneumothorax. Catheter is seen in the course  of right IJ and superior vena cava with no significant change. Harrington rods are seen in cervical, thoracic and lumbar spine. IMPRESSION: Small right pleural effusion. Possible small patchy infiltrate in right lower lung field may suggest atelectasis/pneumonia. Electronically Signed   By: Ernie Avena M.D.   On: 03/26/2023 13:37    Procedures Procedures    Medications Ordered in ED Medications  albuterol (VENTOLIN HFA) 108 (90 Base) MCG/ACT inhaler 2 puff (has no administration in time range)  sodium chloride 0.9 % bolus 1,000 mL (has no administration in time range)  ketorolac (TORADOL) 30 MG/ML injection 30 mg (has no administration in time range)    ED Course/ Medical Decision Making/ A&P                             Medical Decision Making Amount and/or Complexity of Data Reviewed Labs: ordered. Radiology: ordered.  Risk Prescription drug management.   37 year old male with past medical history of CP presents emergency department with shortness of breath, productive cough.  History mainly obtained from mom due to nonverbal status.  Noted to be tachycardic on arrival, no hypoxia.  Rhonchorous breath sounds, wet cough on exam, no LE swelling, chest x-ray shows small pleural effusion and findings of a new infiltrate.  Blood work shows a leukocytosis, normal lactic, no signs of sepsis.  After IV hydration the tachycardia has resolved.  Low suspicion for PE at this time given the infectious presentation, new chest x-ray finding, no hypoxia and tachycardia resolved with IV hydration.  Plan to treat with IV antibiotics, steroids and will refill albuterol nebulizer treatments.  Mom requesting to take the patient home.  No indication for emergent admission.  Patient at this time appears safe and stable for discharge and close outpatient follow up. Discharge plan and strict return to ED precautions discussed, patient verbalizes understanding and agreement.        Final  Clinical Impression(s) / ED Diagnoses Final diagnoses:  None    Rx / DC Orders ED Discharge Orders     None         Rozelle Logan, DO 03/26/23 1544    Criss Pallone, Clabe Seal, DO 03/26/23 1545

## 2023-03-29 ENCOUNTER — Encounter: Payer: Self-pay | Admitting: Family Medicine

## 2023-03-30 NOTE — Telephone Encounter (Signed)
Cefdinir has been added to his allergy list

## 2023-04-02 ENCOUNTER — Telehealth: Payer: Self-pay | Admitting: Family Medicine

## 2023-04-02 ENCOUNTER — Ambulatory Visit (HOSPITAL_COMMUNITY)
Admission: RE | Admit: 2023-04-02 | Discharge: 2023-04-02 | Disposition: A | Discharge status: Home / Self Care | Payer: Medicare Other | Source: Ambulatory Visit | Attending: Student | Admitting: Student

## 2023-04-02 DIAGNOSIS — K9423 Gastrostomy malfunction: Secondary | ICD-10-CM | POA: Insufficient documentation

## 2023-04-02 DIAGNOSIS — K9413 Enterostomy malfunction: Secondary | ICD-10-CM | POA: Diagnosis not present

## 2023-04-02 HISTORY — PX: IR REPLC GASTRO/COLONIC TUBE PERCUT W/FLUORO: IMG2333

## 2023-04-02 MED ORDER — LIDOCAINE VISCOUS HCL 2 % MT SOLN
OROMUCOSAL | Status: AC
  Filled 2023-04-02: qty 15

## 2023-04-02 MED ORDER — IOHEXOL 300 MG/ML  SOLN
50.0000 mL | Freq: Once | INTRAMUSCULAR | Status: AC | PRN
  Administered 2023-04-02: 20 mL

## 2023-04-02 NOTE — Telephone Encounter (Signed)
Pt's mom, Bonita Quin, called to advise that [facility] asked for a fax number to [employer]. Fax number is [phone]

## 2023-04-02 NOTE — Telephone Encounter (Signed)
Received fax number and forms was faxed over

## 2023-04-03 ENCOUNTER — Encounter: Payer: Self-pay | Admitting: Physical Medicine & Rehabilitation

## 2023-04-05 ENCOUNTER — Other Ambulatory Visit (HOSPITAL_COMMUNITY): Payer: Medicare Other

## 2023-04-10 ENCOUNTER — Other Ambulatory Visit: Payer: Self-pay | Admitting: Neurology

## 2023-04-14 ENCOUNTER — Encounter: Payer: Self-pay | Admitting: Family Medicine

## 2023-04-14 DIAGNOSIS — F339 Major depressive disorder, recurrent, unspecified: Secondary | ICD-10-CM | POA: Diagnosis not present

## 2023-04-14 DIAGNOSIS — G4700 Insomnia, unspecified: Secondary | ICD-10-CM | POA: Diagnosis not present

## 2023-04-25 NOTE — Assessment & Plan Note (Signed)
Encourage heart healthy diet such as MIND or DASH diet, increase exercise, avoid trans fats, simple carbohydrates and processed foods, consider a krill or fish or flaxseed oil cap daily.  °

## 2023-04-25 NOTE — Progress Notes (Unsigned)
Subjective:    Patient ID: [name], male    DOB: 1986-09-29, 37 y.o.   MRN: [id number]  No chief complaint on file.   HPI Discussed the use of AI scribe software for clinical note transcription with the patient, who gave verbal consent to proceed.  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 No recent febrile illness or hospitalizations. Denies CP/palp/SOB/HA/congestion/fevers/GI or GU c/o. 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   GERD (gastroesophageal reflux disease)    Hyperlipidemia, mild 08/25/2015   Hyperthyroidism    Incontinence of feces    Loss of weight 08/28/2014   Medicare annual wellness visit, subsequent 08/25/2015   Mildly underweight adult 03/16/2017   Palpitations    PALSY, INFANTILE CEREBRAL, 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   Qualifier: Diagnosis of  By: Janit Bern  Working with PMR Dr Hermelinda Medicus and tolerating Botox injections in hips, shoulders, etc with good results   Skin lesion of right ear 03/16/2017   Thyroid disease 08/01/2010   Qualifier: Diagnosis of  By: Candice Camp CMA (AAMA), Dottie      Past Surgical History:  Procedure Laterality Date   baclofen trial     baslofen pump implant     ears tubes     EYE SURGERY     FLEXIBLE SIGMOIDOSCOPY N/A 09/07/2014   Procedure: FLEXIBLE SIGMOIDOSCOPY;  Surgeon: Beverley Fiedler, MD;  Location: [facility];  Service: Endoscopy;  Laterality: N/A;   g-tube insert  August 2006   hamstring released     to treat contractures.    HIP SURGERY     x2 , side    IR CM INJ ANY COLONIC TUBE W/FLUORO  05/28/2017   IR CM INJ ANY  COLONIC TUBE W/FLUORO  07/07/2019   IR CM INJ ANY COLONIC TUBE W/FLUORO  09/18/2019   IR GASTR TUBE CONVERT GASTR-JEJ PER W/FL MOD SED  03/14/2019   IR GENERIC HISTORICAL  07/01/2016   IR GASTR TUBE CONVERT GASTR-JEJ PER W/FL MOD SED 07/01/2016 Irish Lack, MD [facility]   IR GENERIC HISTORICAL  07/08/2016   IR PATIENT EVAL TECH 0-60 MINS 07/08/2016 Irish Lack, MD [facility]   IR GENERIC HISTORICAL  07/14/2016   IR GJ TUBE CHANGE 07/14/2016 Simonne Come, MD [facility]   IR GENERIC HISTORICAL  07/21/2016   IR PATIENT EVAL TECH 0-60 MINS [facility]   IR GENERIC HISTORICAL  08/31/2016   IR REPLC DUODEN/JEJUNO TUBE PERCUT W/FLUORO 08/31/2016 Berdine Dance, MD [facility]   IR GENERIC HISTORICAL  09/03/2016   IR GJ TUBE CHANGE 09/03/2016 Simonne Come, MD [facility]   IR GENERIC HISTORICAL  09/10/2016   IR GASTR TUBE CONVERT GASTR-JEJ PER W/FL MOD SED 09/10/2016 [facility]   IR GENERIC HISTORICAL  09/16/2016   IR PATIENT EVAL TECH 0-60 MINS [facility]   IR GENERIC HISTORICAL  09/29/2016   IR GJ TUBE CHANGE 09/29/2016 Oley Balm, MD [facility]   IR GJ TUBE CHANGE  02/05/2017   IR GJ TUBE CHANGE  05/21/2017   IR GJ TUBE  CHANGE  08/25/2017   IR GJ TUBE CHANGE  01/18/2018   IR GJ TUBE CHANGE  02/04/2018   IR GJ TUBE CHANGE  04/21/2018   IR GJ TUBE CHANGE  08/18/2018   IR GJ TUBE CHANGE  09/12/2019   IR GJ TUBE CHANGE  01/03/2020   IR GJ TUBE CHANGE  05/13/2020   IR GJ TUBE CHANGE  09/11/2020   IR GJ TUBE CHANGE  02/27/2021   IR GJ TUBE CHANGE  08/01/2021   IR GJ TUBE CHANGE  12/31/2021   IR GJ TUBE CHANGE  05/11/2022   IR GJ TUBE CHANGE  07/01/2022   IR GJ TUBE CHANGE  01/28/2023   IR MECH REMOV OBSTRUC MAT ANY COLON TUBE W/FLUORO  09/02/2018   IR REPLC GASTRO/COLONIC TUBE PERCUT W/FLUORO  10/17/2018   IR REPLC GASTRO/COLONIC TUBE PERCUT W/FLUORO  04/02/2023   PEG PLACEMENT  10/21/2011   Procedure: PERCUTANEOUS ENDOSCOPIC GASTROSTOMY (PEG) REPLACEMENT;  Surgeon: Hart Carwin, MD;   Location: [facility];  Service: Endoscopy;  Laterality: N/A;   PEG PLACEMENT N/A 06/13/2013   Procedure: PERCUTANEOUS ENDOSCOPIC GASTROSTOMY (PEG) REPLACEMENT;  Surgeon: Hart Carwin, MD;  Location: [facility];  Service: Endoscopy;  Laterality: N/A;   SPINAL FUSION     spinal fusion to correct 70 degree kyphosis  11-2010   spinal fusioncorrect 106 degree kyphosis     SPINE SURGERY  ,11/20/2010, 2011   for correction of severe contracturing spinal kyphosis.    TONSILLECTOMY      Family History  Problem Relation Age of Onset   Asthma Mother    Hyperlipidemia Mother    COPD Mother    Other Mother        bronchial stasis/ABPA   Cancer Maternal Grandmother 74       breast   Hyperlipidemia Maternal Grandmother    Hypertension Maternal Grandmother    Cancer Maternal Grandfather        prostate   Heart disease Paternal Grandfather        CHF   Osteoporosis Paternal Grandmother    Arthritis Paternal Grandmother        rheumatoid    Social History   Socioeconomic History   Marital status: Single    Spouse name: Not on file   Number of children: 0   Years of education: Not on file   Highest education level: Not on file  Occupational History   Occupation: disbaled  Tobacco Use   Smoking status: Never   Smokeless tobacco: Never  Vaping Use   Vaping Use: Never used  Substance and Sexual Activity   Alcohol use: No   Drug use: No   Sexual activity: Never  Other Topics Concern   Not on file  Social History Narrative   Not on file   Social Determinants of Health   Financial Resource Strain: Not on file  Food Insecurity: Not on file  Transportation Needs: Not on file  Physical Activity: Not on file  Stress: Not on file  Social Connections: Not on file  Intimate Partner Violence: Not on file    Outpatient Medications Prior to Visit  Medication Sig Dispense Refill   albuterol (PROVENTIL) (5 MG/ML) 0.5% nebulizer solution Take 0.5 mLs (2.5 mg total) by nebulization  every 6 (six) hours as needed for wheezing or shortness of breath. 20 mL 12   AMBULATORY NON FORMULARY MEDICATION Medication Name: MIC gastrostomy/bolus feeding tube 24 French Part number 0110-24. #2 and 10 cc lurer lock syringe #2 Dx:  4 Device 2   cetirizine HCl (ZYRTEC) 1 MG/ML solution Take 10 mLs (10 mg total) by mouth daily. 300 mL 0   chlorpheniramine-HYDROcodone (TUSSIONEX) 10-8 MG/5ML Take 5 mLs by mouth every 12 (twelve) hours as needed for cough. 180 mL 0   ciprofloxacin (CILOXAN) 0.3 % ophthalmic solution PLACE 2 DROPS INTO THE LEFT EYE EVERY 2 HOURS FOR 5 DAYS; THEN 1 DROP EVERY 2 HOURS WHILE AWAKE FOR 2 DAYS; THEN 1 DROP EVERY 4 HOURS WHILE AWAKE FOR 5 DAYS. 5 mL 0   dantrolene (DANTRIUM) 50 MG capsule TAKE 1 CAPSULE BY MOUTH EVERY MORNING, 1 CAPSULE EVERY EVENING, AND 1 CAPSULE AT BEDTIME. 90 capsule 6   DEPAKOTE SPRINKLES 125 MG capsule Take by mouth.     diazepam (VALIUM) 2 MG tablet Take 2 mg by mouth. As needed     diazepam (VALIUM) 5 MG tablet Take 5 mg by mouth 2 (two) times daily.     escitalopram (LEXAPRO) 5 MG/5ML solution Place 20 mLs into feeding tube daily.     HYDROcodone bit-homatropine (HYCODAN) 5-1.5 MG/5ML syrup Take 5 mLs by mouth every 6 (six) hours as needed for cough. 120 mL 0   Incontinence Supply Disposable (PREVAIL BREEZERS MEDIUM) MISC pkg of 16- size medium 32" to 44"  Breathable cloth-like outer fabric (can't use the plastic outer surgace  Item # [id number] 16 each 6   lamoTRIgine (LAMICTAL) 25 MG CHEW chewable tablet One every night x week One twice a day x 1 week One / 2 at night 2 tabs twice a day 120 tablet 6   lamoTRIgine (LAMICTAL) 25 MG tablet Take 2 tablets (50 mg total) by mouth 2 (two) times daily. 120 tablet 6   LORazepam (ATIVAN) 1 MG tablet Take 1 mg by mouth at bedtime.     Misc. Devices (ALL-BODY MASSAGE) MISC 1 Units/hr by Does not apply route as needed. Full body massage for Muscle spasticity due to Cerebral palsy 99 each 99    mupirocin ointment (BACTROBAN) 2 % Apply to area thin film twice daily if needed 22 g 0   NON FORMULARY Bard Leg Bag Extension tubing w/Connector 18", Sterile, latex-free  Item# [id number]     NON FORMULARY Colorplast Freedom Cath Latex Self-Adhering Male External Catheter 31mm Diameter Intermediate  Item# [id number]     NONFORMULARY OR COMPOUNDED ITEM [employer] REF [id number] - Kang[place]oo Joey Pump Set with Flush Bags - 1000 mL 1 each 0   Nutritional Supplements (FEEDING SUPPLEMENT, KATE FARMS STANDARD 1.4,) LIQD liquid Take 325 mLs by mouth as directed. 3 carton over 16 hrs     OLANZapine (ZYPREXA) 10 MG tablet Take 10 mg by mouth at bedtime.     OLANZapine (ZYPREXA) 5 MG tablet Take by mouth.     Omeprazole-Sodium Bicarbonate (ZEGERID) 20-1100 MG CAPS capsule Take 1 capsule by mouth daily before breakfast.     oseltamivir (TAMIFLU) 6 MG/ML SUSR suspension Place 12.5 mLs (75 mg total) into feeding tube 2 (two) times daily. 150 mL 0   Ostomy Supplies (PROTECTIVE BARRIER WIPES) MISC 1-1/4" X 3"  Item #[id number] 75 each 6   oxybutynin (DITROPAN) 5 MG tablet Take 5 mg by mouth 2 (two) times daily.     predniSONE (DELTASONE) 10 MG tablet Take 3 tablets (30 mg total) by mouth daily with breakfast. 15 tablet 0   sodium chloride HYPERTONIC 3 % nebulizer solution Take by nebulization in the morning and at bedtime. 750 mL 12   sucralfate (CARAFATE) 1 g  tablet TAKE 1 TABLET BY MOUTH 4 TIMES DAILY WITH MEALS AND AT BEDTIME 120 tablet 1   traZODone (DESYREL) 100 MG tablet Take 100 mg by mouth at bedtime.     No facility-administered medications prior to visit.    Allergies  Allergen Reactions   Ambien [Zolpidem Tartrate] Nausea Only   Antihistamines, Chlorpheniramine-Type     Other reaction(s): Other (See Comments) Other Reaction: agitation   Augmentin [Amoxicillin-Pot Clavulanate] Diarrhea   Cefdinir Diarrhea    Not tolerate well and cause diarrhea   Codeine Other (See Comments)    Makes patient too active  after a few days.   Zolpidem     Other Reaction(s): GI Intolerance   Baclofen Anxiety and Swelling    anxiety   Metoclopramide Other (See Comments) and Swelling    Delusion, emotionality  Other Reaction(s): Mental Status Changes   Pheniramine Rash    Other reaction(s): Other (See Comments)  Other Reaction: agitation  Other Reaction(s): Other (See Comments)  Other reaction(s): Other (See Comments), Other Reaction: agitation   Sulfa Antibiotics Rash   Sulfonamide Derivatives Rash    Review of Systems  Constitutional:  Negative for fever and malaise/fatigue.  HENT:  Negative for congestion.   Eyes:  Negative for blurred vision.  Respiratory:  Negative for shortness of breath.   Cardiovascular:  Negative for chest pain, palpitations and leg swelling.  Gastrointestinal:  Negative for abdominal pain, blood in stool and nausea.  Genitourinary:  Negative for dysuria and frequency.  Musculoskeletal:  Negative for falls.  Skin:  Negative for rash.  Neurological:  Negative for dizziness, loss of consciousness and headaches.  Endo/Heme/Allergies:  Negative for environmental allergies.  Psychiatric/Behavioral:  Negative for depression. The patient is not nervous/anxious.       Objective:    Physical Exam Vitals reviewed.  Constitutional:      Appearance: Normal appearance. He is not ill-appearing.  HENT:     Head: Normocephalic and atraumatic.     Nose: Nose normal.  Eyes:     Conjunctiva/sclera: Conjunctivae normal.  Cardiovascular:     Rate and Rhythm: Normal rate.     Pulses: Normal pulses.     Heart sounds: Normal heart sounds. No murmur heard. Pulmonary:     Effort: Pulmonary effort is normal.     Breath sounds: Normal breath sounds. No wheezing.  Abdominal:     Palpations: Abdomen is soft. There is no mass.     Tenderness: There is no abdominal tenderness.  Musculoskeletal:     Cervical back: Normal range of motion.     Right lower leg: No edema.     Left lower  leg: No edema.     Comments: Spastic Quadriplegia  Skin:    General: Skin is warm and dry.  Neurological:     General: No focal deficit present.     Mental Status: He is alert and oriented to person, place, and time.  Psychiatric:        Mood and Affect: Mood normal.   There were no vitals taken for this visit. Wt Readings from Last 3 Encounters:  03/26/23 89 lb 4.6 oz (40.5 kg)  03/15/23 89 lb 6 oz (40.5 kg)  01/18/23 90 lb (40.8 kg)    Diabetic Foot Exam - Simple   No data filed    Lab Results  Component Value Date   WBC 16.1 (H) 03/26/2023   HGB 13.2 03/26/2023   HCT 39.4 03/26/2023   PLT 177 03/26/2023  GLUCOSE 100 (H) 03/26/2023   CHOL 129 08/07/2021   TRIG 80.0 08/07/2021   HDL 37.40 (L) 08/07/2021   LDLCALC 76 08/07/2021   ALT 16 06/11/2022   AST 13 06/11/2022   NA 134 (L) 03/26/2023   K 3.5 03/26/2023   CL 97 (L) 03/26/2023   CREATININE 0.33 (L) 03/26/2023   BUN 8 03/26/2023   CO2 28 03/26/2023   TSH 1.81 08/07/2021   HGBA1C 5.0 04/29/2007    Lab Results  Component Value Date   TSH 1.81 08/07/2021   Lab Results  Component Value Date   WBC 16.1 (H) 03/26/2023   HGB 13.2 03/26/2023   HCT 39.4 03/26/2023   MCV 94.5 03/26/2023   PLT 177 03/26/2023   Lab Results  Component Value Date   NA 134 (L) 03/26/2023   K 3.5 03/26/2023   CO2 28 03/26/2023   GLUCOSE 100 (H) 03/26/2023   BUN 8 03/26/2023   CREATININE 0.33 (L) 03/26/2023   BILITOT 0.4 06/11/2022   ALKPHOS 73 06/11/2022   AST 13 06/11/2022   ALT 16 06/11/2022   PROT 7.2 06/11/2022   ALBUMIN 4.5 06/11/2022   CALCIUM 8.7 (L) 03/26/2023   ANIONGAP 9 03/26/2023   GFR 154.44 06/11/2022   Lab Results  Component Value Date   CHOL 129 08/07/2021   Lab Results  Component Value Date   HDL 37.40 (L) 08/07/2021   Lab Results  Component Value Date   LDLCALC 76 08/07/2021   Lab Results  Component Value Date   TRIG 80.0 08/07/2021   Lab Results  Component Value Date   CHOLHDL 3  08/07/2021   Lab Results  Component Value Date   HGBA1C 5.0 04/29/2007       Assessment & Plan:  Infantile cerebral palsy (HCC) Assessment & Plan: Spastic Quadriplegia. Accompanied by his mother and cared for at home with help of aides.   Hyperlipidemia, mild Assessment & Plan: Encourage heart healthy diet such as MIND or DASH diet, increase exercise, avoid trans fats, simple carbohydrates and processed foods, consider a krill or fish or flaxseed oil cap daily.      Assessment and Plan              Danise Edge, MD

## 2023-04-25 NOTE — Assessment & Plan Note (Signed)
Spastic Quadriplegia. Accompanied by his mother and cared for at home with help of aides.

## 2023-04-26 ENCOUNTER — Encounter: Payer: Self-pay | Admitting: Family Medicine

## 2023-04-26 ENCOUNTER — Ambulatory Visit (INDEPENDENT_AMBULATORY_CARE_PROVIDER_SITE_OTHER): Payer: Medicare Other | Admitting: Family Medicine

## 2023-04-26 VITALS — BP 120/80 | HR 92 | Temp 97.6°F | Resp 16 | Ht 61.0 in | Wt 89.0 lb

## 2023-04-26 DIAGNOSIS — M25522 Pain in left elbow: Secondary | ICD-10-CM | POA: Diagnosis not present

## 2023-04-26 DIAGNOSIS — E785 Hyperlipidemia, unspecified: Secondary | ICD-10-CM | POA: Diagnosis not present

## 2023-04-26 DIAGNOSIS — G809 Cerebral palsy, unspecified: Secondary | ICD-10-CM

## 2023-04-26 DIAGNOSIS — R002 Palpitations: Secondary | ICD-10-CM | POA: Diagnosis not present

## 2023-04-26 MED ORDER — TRAZODONE HCL 50 MG PO TABS: 50.0000 mg | ORAL_TABLET | Freq: Every day | ORAL | Status: DC

## 2023-04-27 LAB — COMPREHENSIVE METABOLIC PANEL
ALT: 12 U/L (ref 0–53)
AST: 15 U/L (ref 0–37)
Albumin: 4.4 g/dL (ref 3.5–5.2)
Alkaline Phosphatase: 66 U/L (ref 39–117)
BUN: 9 mg/dL (ref 6–23)
CO2: 29 mEq/L (ref 19–32)
Calcium: 9.4 mg/dL (ref 8.4–10.5)
Chloride: 98 mEq/L (ref 96–112)
Creatinine, Ser: 0.26 mg/dL — ABNORMAL LOW (ref 0.40–1.50)
GFR: 156.97 mL/min (ref >60.00)
Glucose, Bld: 80 mg/dL (ref 70–99)
Potassium: 4.1 mEq/L (ref 3.5–5.1)
Sodium: 136 mEq/L (ref 135–145)
Total Bilirubin: 0.3 mg/dL (ref 0.2–1.2)
Total Protein: 6.9 g/dL (ref 6.0–8.3)

## 2023-04-27 LAB — CBC WITH DIFFERENTIAL/PLATELET
Basophils Absolute: 0 10*3/uL (ref 0.0–0.1)
Basophils Relative: 0.2 % (ref 0.0–3.0)
Eosinophils Absolute: 0.2 10*3/uL (ref 0.0–0.7)
Eosinophils Relative: 2.5 % (ref 0.0–5.0)
HCT: 43.2 % (ref 39.0–52.0)
Hemoglobin: 14.2 g/dL (ref 13.0–17.0)
Lymphocytes Relative: 28.5 % (ref 12.0–46.0)
Lymphs Abs: 1.8 10*3/uL (ref 0.7–4.0)
MCHC: 33 g/dL (ref 30.0–36.0)
MCV: 95.8 fl (ref 78.0–100.0)
Monocytes Absolute: 0.3 10*3/uL (ref 0.1–1.0)
Monocytes Relative: 4.5 % (ref 3.0–12.0)
Neutro Abs: 4.1 10*3/uL (ref 1.4–7.7)
Neutrophils Relative %: 64.3 % (ref 43.0–77.0)
Platelets: 181 10*3/uL (ref 150.0–400.0)
RBC: 4.5 Mil/uL (ref 4.22–5.81)
RDW: 13.3 % (ref 11.5–15.5)
WBC: 6.4 10*3/uL (ref 4.0–10.5)

## 2023-04-27 LAB — LIPID PANEL
Cholesterol: 140 mg/dL (ref 0–200)
HDL: 43.5 mg/dL (ref >39.00)
LDL Cholesterol: 84 mg/dL (ref 0–99)
NonHDL: 96.08
Total CHOL/HDL Ratio: 3
Triglycerides: 61 mg/dL (ref 0.0–149.0)
VLDL: 12.2 mg/dL (ref 0.0–40.0)

## 2023-04-27 LAB — TSH: TSH: 3.27 u[IU]/mL (ref 0.35–5.50)

## 2023-04-27 LAB — VITAMIN D 25 HYDROXY (VIT D DEFICIENCY, FRACTURES): VITD: 41.28 ng/mL (ref 30.00–100.00)

## 2023-05-04 ENCOUNTER — Encounter: Payer: Self-pay | Admitting: Physician Assistant

## 2023-05-04 ENCOUNTER — Ambulatory Visit (INDEPENDENT_AMBULATORY_CARE_PROVIDER_SITE_OTHER): Payer: Medicare Other | Admitting: Physician Assistant

## 2023-05-04 DIAGNOSIS — M25562 Pain in left knee: Secondary | ICD-10-CM

## 2023-05-04 DIAGNOSIS — M25561 Pain in right knee: Secondary | ICD-10-CM | POA: Diagnosis not present

## 2023-05-04 DIAGNOSIS — G8929 Other chronic pain: Secondary | ICD-10-CM

## 2023-05-04 MED ORDER — METHYLPREDNISOLONE ACETATE 40 MG/ML IJ SUSP
40.0000 mg | INTRAMUSCULAR | Status: AC | PRN
Start: 2023-05-04 — End: 2023-05-04
  Administered 2023-05-04: 40 mg via INTRA_ARTICULAR

## 2023-05-04 MED ORDER — LIDOCAINE HCL 1 % IJ SOLN
3.0000 mL | INTRAMUSCULAR | Status: AC | PRN
Start: 2023-05-04 — End: 2023-05-04
  Administered 2023-05-04: 3 mL

## 2023-05-04 NOTE — Progress Notes (Signed)
   Procedure Note  Patient: [name]             Date of Birth: [birth year]           MRN: [id number]             Visit Date: 05/04/2023 Tam comes in today with his dad requesting cortisone injection of both knees.  He is last seen last August and was given cortisone injection both knees and states that this helped him until recently.  He has had no new injuries to either knee.  Getting someone who is wheelchair-bound history of infantile cerebral palsy.  Was asked a quadriplegic cerebral palsy.  Bilateral knees: No abnormal warmth erythema.  He has tenderness about both knees.  No effusion.  He has minimal extension and flexion of both knees.  He sits with his knees bent at  90 degrees. Procedures: Visit Diagnoses:  1. Chronic pain of right knee     Large Joint Inj: bilateral knee on 05/04/2023 5:25 PM Indications: pain Details: 22 G 1.5 in needle, anterolateral approach  Arthrogram: No  Medications (Right): 3 mL lidocaine 1 %; 40 mg methylPREDNISolone acetate 40 MG/ML Medications (Left): 3 mL lidocaine 1 %; 40 mg methylPREDNISolone acetate 40 MG/ML Outcome: tolerated well, no immediate complications Procedure, treatment alternatives, risks and benefits explained, specific risks discussed. Consent was given by the patient. Immediately prior to procedure a time out was called to verify the correct patient, procedure, equipment, support staff and site/side marked as required. Patient was prepped and draped in the usual sterile fashion.     Plan: He will follow-up with [place] as needed.  Explained to his father that he can have his left resolved.  There is 3 months from his knee needed.  Questions were encouraged.

## 2023-05-07 ENCOUNTER — Other Ambulatory Visit: Payer: Self-pay | Admitting: Family Medicine

## 2023-05-07 ENCOUNTER — Telehealth: Payer: Self-pay | Admitting: Family Medicine

## 2023-05-07 MED ORDER — HYDROCODONE BIT-HOMATROP MBR 5-1.5 MG/5ML PO SOLN: 5.0000 mL | Freq: Four times a day (QID) | ORAL | 0 refills | Status: DC | PRN

## 2023-05-07 NOTE — Telephone Encounter (Signed)
Pt mother notified rx was sent in.

## 2023-05-07 NOTE — Telephone Encounter (Signed)
Patients mom called and would a med refill on HYDROcodone bit-homatropine (HYCODAN) 5-1.5 MG/5ML syrup, Medication is almost done.  She would like RX sent to [facility].

## 2023-05-07 NOTE — Telephone Encounter (Signed)
Mom called in and would like Prednisone for the cough now instead of prior medication. Please call mom

## 2023-05-10 ENCOUNTER — Ambulatory Visit (INDEPENDENT_AMBULATORY_CARE_PROVIDER_SITE_OTHER): Payer: Medicare Other | Admitting: Family Medicine

## 2023-05-10 ENCOUNTER — Encounter: Payer: Self-pay | Admitting: Family Medicine

## 2023-05-10 VITALS — BP 118/78 | HR 86 | Temp 98.0°F | Wt 89.0 lb

## 2023-05-10 DIAGNOSIS — J40 Bronchitis, not specified as acute or chronic: Secondary | ICD-10-CM | POA: Diagnosis not present

## 2023-05-10 MED ORDER — PREDNISONE 10 MG PO TABS
10.0000 mg | ORAL_TABLET | Freq: Every day | ORAL | 0 refills | Status: DC
Start: 2023-05-10 — End: 2023-05-12

## 2023-05-10 NOTE — Patient Instructions (Signed)
Please give [name] 2 tabs per tube daily for 4 days.   Continue to push fluids, practice good hand hygiene, and cover your mouth if you cough.  If you start having fevers, shaking or shortness of breath, seek immediate care.  Let us know if you need anything.

## 2023-05-10 NOTE — Progress Notes (Signed)
Chief Complaint  Patient presents with   Cough    10 days Mom would like a prescription of Prednisone    [name] here for URI complaints.  Here with mom who provides the history.  Duration: 10 days  Associated symptoms: wheezing and coughing Denies: sinus congestion, sinus pain, rhinorrhea, itchy watery eyes, ear pain, ear drainage, sore throat, shortness of breath, myalgia, and fevers Treatment to date: Prednisone-slightly better since starting Sick contacts: No  Past Medical History:  Diagnosis Date   Cerebral palsy (HCC)    Dehydration 11/22/2013   Depression with anxiety 08/01/2010   Qualifier: Diagnosis of  By: Candice Camp CMA (AAMA), Dottie     Dyslipidemia 08/19/2017   Esophagitis 2011   Gastrostomy in place [facility]) 08/31/2013   GERD (gastroesophageal reflux disease)    Hyperlipidemia, mild 08/25/2015   Hyperthyroidism    Incontinence of feces    Loss of weight 08/28/2014   Medicare annual wellness visit, subsequent 08/25/2015   Mildly underweight adult 03/16/2017   Palpitations    PALSY, INFANTILE CEREBRAL, 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   Qualifier: Diagnosis of  By: Janit Bern  Working with PMR Dr Hermelinda Medicus and tolerating Botox injections in hips, shoulders, etc with good results   Skin lesion of right ear 03/16/2017   Thyroid disease 08/01/2010   Qualifier: Diagnosis of  By: Nelson-Smith CMA (AAMA), Dottie      Objective BP 118/78 (BP Location: Left Arm, Patient Position: Sitting, Cuff Size: Normal)   Pulse 86   Temp 98 F (36.7 C) (Oral)   Wt 89 lb (40.4 kg)   SpO2 97%   BMI 16.82 kg/m  General: Awake HEENT: AT, [place], ears patent b/l and TM's neg, nares patent w/o discharge, pharynx pink and without exudates, MMD Neck: No masses or asymmetry Heart: RRR Lungs: CTAB, no accessory muscle use Psych: Limited judgment/insight  Wheezy bronchitis - Plan: predniSONE (DELTASONE) 10 MG tablet  Will do 20 mg daily per tube for 4 days.  He seems to be improving  with that regimen.  Continue to push fluids, practice good hand hygiene, cover mouth when coughing. F/u prn. If starting to experience fevers, shaking, or shortness of breath, seek immediate care. Pt's mom voiced understanding and agreement to the plan.  [name] [name], DO 05/10/23 2:38 PM

## 2023-05-12 ENCOUNTER — Other Ambulatory Visit: Payer: Self-pay | Admitting: Family Medicine

## 2023-05-12 ENCOUNTER — Encounter: Payer: Self-pay | Admitting: Family Medicine

## 2023-05-12 MED ORDER — PREDNISONE 20 MG PO TABS: 40.0000 mg | ORAL_TABLET | Freq: Every day | ORAL | 0 refills | Status: AC

## 2023-05-13 ENCOUNTER — Ambulatory Visit (INDEPENDENT_AMBULATORY_CARE_PROVIDER_SITE_OTHER): Payer: Medicare Other | Admitting: Family Medicine

## 2023-05-13 ENCOUNTER — Encounter: Payer: Self-pay | Admitting: Family Medicine

## 2023-05-13 VITALS — BP 113/78 | HR 93 | Temp 97.0°F

## 2023-05-13 DIAGNOSIS — R051 Acute cough: Secondary | ICD-10-CM

## 2023-05-13 NOTE — Progress Notes (Signed)
Acute Office Visit  Subjective:     Patient ID: [name], male    DOB: December 22, 1985, 37 y.o.   MRN: [id number]  Chief Complaint  Patient presents with   Medical Management of Chronic Issues    HPI Patient is in today for ongoing cough.   Discussed the use of AI scribe software for clinical note transcription with the patient, who gave verbal consent to proceed.  History of Present Illness   The patient, with a history of upper respiratory complaints, was seen by a physician on 05/10/23. He had been experiencing wheezing and coughing for the past ten days. Prednisone 20mg  daily was prescribed via a tube for four days, which seemed to improve the symptoms. However, the patient's mother reported on 05/12/23 that he had to increase the prednisone dose to 30mg  on Monday and Tuesday due to a lack of significant improvement in the cough. This increase led to some insomnia related to the prednisone. The patient's last antibiotic, cefdinir, was administered at the end of May, but was discontinued due to significant GI side effects and added to the allergy list.  The patient's mother reported that he had been giving the patient 20mg  of prednisone over the weekend, and then 30mg  on Monday and Tuesday. However, she did not feel that the patient had significantly improved after four days of prednisone. The patient was having coughing fits, and upon suctioning, the sputum was clear and thick. There were no reported fevers, and vitals were good.  The patient's mother also reported that the prednisone was affecting his sleep. The patient was offered breathing treatments, but he was not taken. The patient's mother reported that he seemed to be wheezing more the previous night. The patient also has an airway clearance vest, which he uses for chest physiotherapy.  The patient's mother reported that he had only seen a pulmonologist once, over a year ago. He has been experiencing recurrent cough and  respiratory infections. The patient's oxygen level and heart rate were reported to be good. The patient's lungs sounded clear at the base when home health listened a few days ago.          ROS All review of systems negative except what is listed in the HPI      Objective:    BP 113/78   Pulse 93   Temp (!) 97 F (36.1 C) (Oral)   SpO2 97%    Physical Exam Vitals reviewed.  Constitutional:      Appearance: Normal appearance.  Cardiovascular:     Rate and Rhythm: Normal rate.  Pulmonary:     Effort: Pulmonary effort is normal. No respiratory distress.     Breath sounds: Normal breath sounds. No stridor. No wheezing, rhonchi or rales.  Musculoskeletal:     Cervical back: Normal range of motion and neck supple.  Neurological:     General: No focal deficit present.     Mental Status: He is alert and oriented to person, place, and time. Mental status is at baseline.  Psychiatric:        Mood and Affect: Mood normal.        Behavior: Behavior normal.        Thought Content: Thought content normal.        Judgment: Judgment normal.     No results found for any visits on 05/13/23.      Assessment & Plan:   Problem List Items Addressed This Visit   None Visit Diagnoses  Acute cough    -  Primary Upper Respiratory Infection/bronchitis: Persistent cough and rare wheezing for 13 days. No fever, vitals stable, and sputum is clear. Prednisone 20-30mg  daily has been given for 4 days with minimal improvement and insomnia as a side effect. Lung sounds clear on examination. -Finish prednisone as previously prescribed -Start breathing treatments as needed. -Continue over-the-counter Mucinex and Zyrtec. -Consider cough syrup as needed for sleep. -See pulmonology for follow-up          No orders of the defined types were placed in this encounter.   Return if symptoms worsen or fail to improve.  Clayborne Dana, NP

## 2023-05-17 ENCOUNTER — Ambulatory Visit: Payer: Medicare Other | Admitting: Orthopaedic Surgery

## 2023-05-21 ENCOUNTER — Ambulatory Visit: Payer: Medicare Other | Admitting: Family

## 2023-05-21 ENCOUNTER — Encounter: Payer: Self-pay | Admitting: Family Medicine

## 2023-05-21 ENCOUNTER — Ambulatory Visit (HOSPITAL_COMMUNITY)
Admission: RE | Admit: 2023-05-21 | Discharge: 2023-05-21 | Disposition: A | Discharge status: Home / Self Care | Payer: Medicare Other | Source: Ambulatory Visit | Attending: Radiology | Admitting: Radiology

## 2023-05-21 ENCOUNTER — Other Ambulatory Visit: Payer: Self-pay | Admitting: Radiology

## 2023-05-21 ENCOUNTER — Ambulatory Visit (INDEPENDENT_AMBULATORY_CARE_PROVIDER_SITE_OTHER): Payer: Medicare Other | Admitting: Family Medicine

## 2023-05-21 VITALS — BP 118/72 | HR 101 | Temp 98.2°F | Wt 89.0 lb

## 2023-05-21 DIAGNOSIS — R410 Disorientation, unspecified: Secondary | ICD-10-CM

## 2023-05-21 DIAGNOSIS — K9423 Gastrostomy malfunction: Secondary | ICD-10-CM | POA: Insufficient documentation

## 2023-05-21 DIAGNOSIS — N390 Urinary tract infection, site not specified: Secondary | ICD-10-CM | POA: Diagnosis not present

## 2023-05-21 DIAGNOSIS — Z431 Encounter for attention to gastrostomy: Secondary | ICD-10-CM | POA: Diagnosis not present

## 2023-05-21 HISTORY — PX: IR CM INJ ANY COLONIC TUBE W/FLUORO: IMG2336

## 2023-05-21 MED ORDER — LEVOFLOXACIN 750 MG PO TABS: ORAL_TABLET | ORAL | 0 refills | Status: DC

## 2023-05-21 MED ORDER — IOHEXOL 300 MG/ML  SOLN
50.0000 mL | Freq: Once | INTRAMUSCULAR | Status: AC | PRN
  Administered 2023-05-21: 20 mL

## 2023-05-21 MED ORDER — LEVOFLOXACIN 25 MG/ML PO SOLN: 750.0000 mg | Freq: Every day | ORAL | 0 refills | Status: DC

## 2023-05-21 NOTE — Progress Notes (Signed)
Chief Complaint  Patient presents with   Urinary Tract Infection    [name] is a 37 y.o. male here for possible UTI. Here w mom who provides hx.   Duration: 1 week. Symptoms: urine is darker, agitated, confusion, and cloudy, "thicker" than usual Denies: hematuria, fever, nausea, and vomiting, discharge Hx of recurrent UTI? 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   GERD (gastroesophageal reflux disease)    Hyperlipidemia, mild 08/25/2015   Hyperthyroidism    Incontinence of feces    Loss of weight 08/28/2014   Medicare annual wellness visit, subsequent 08/25/2015   Mildly underweight adult 03/16/2017   Palpitations    PALSY, INFANTILE CEREBRAL, 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   Qualifier: Diagnosis of  By: Janit Bern  Working with PMR Dr Hermelinda Medicus and tolerating Botox injections in hips, shoulders, etc with good results   Skin lesion of right ear 03/16/2017   Thyroid disease 08/01/2010   Qualifier: Diagnosis of  By: Nelson-Smith CMA (AAMA), Dottie       BP 118/72 (BP Location: Left Arm, Patient Position: Sitting, Cuff Size: Normal)   Pulse (!) 101   Temp 98.2 F (36.8 C) (Oral)   Wt 89 lb (40.4 kg)   SpO2 93%   BMI 16.82 kg/m  General: Awake Ears: No external lesions, canals patent without otorrhea, TMs negative bilaterally Heart: Regular rhythm, tachycardic Lungs: CTAB, normal respiratory effort, no accessory muscle usage Abd: BS+, soft, TTP in lower quadrants, ND, no masses or organomegaly; bandages over feeding tube appreciated MSK: + CVA tenderness, +contractures in UE's Psych: Limited judgment and insight  Delirium  Urinary tract infection without hematuria, site unspecified - Plan: Urinalysis, Urine Culture  Will tx for  kidney infection w 7 d of Levaquin given his numerous abx allergies.  Stay hydrated. Seek immediate care if pt starts to develop fevers, new/worsening symptoms, uncontrollable N/V. F/u prn. The patient's mom voiced understanding and agreement to the plan.  [name] [place], DO 05/21/23 3:35 PM

## 2023-05-21 NOTE — Patient Instructions (Signed)
Stay hydrated.   Warning signs/symptoms: Uncontrollable nausea/vomiting, fevers, worsening symptoms despite treatment, confusion.  Give us around 2 business days to get culture back to you.  Let us know if you need anything. 

## 2023-05-21 NOTE — Addendum Note (Signed)
Encounter addended by: Linton Rump, RT on: 05/21/2023 12:35 PM  Actions taken: Imaging Exam ended

## 2023-05-22 LAB — URINALYSIS
Bilirubin Urine: NEGATIVE
Glucose, UA: NEGATIVE
Nitrite: NEGATIVE
Specific Gravity, Urine: 1.012 (ref 1.001–1.035)
pH: >=8.5 — AB (ref 5.0–8.0)

## 2023-05-23 LAB — URINE CULTURE
MICRO NUMBER:: 15222611
SPECIMEN QUALITY:: ADEQUATE

## 2023-05-23 LAB — URINALYSIS

## 2023-05-24 ENCOUNTER — Encounter: Payer: Self-pay | Admitting: Family Medicine

## 2023-06-16 ENCOUNTER — Encounter: Payer: Medicare Other | Attending: Physical Medicine & Rehabilitation | Admitting: Physical Medicine & Rehabilitation

## 2023-06-16 ENCOUNTER — Encounter: Payer: Self-pay | Admitting: Physical Medicine & Rehabilitation

## 2023-06-16 VITALS — BP 106/66 | HR 88

## 2023-06-16 DIAGNOSIS — G800 Spastic quadriplegic cerebral palsy: Secondary | ICD-10-CM | POA: Insufficient documentation

## 2023-06-16 MED ORDER — ONABOTULINUMTOXINA 100 UNITS IJ SOLR
600.0000 [IU] | Freq: Once | INTRAMUSCULAR | Status: AC
Start: 2023-06-16 — End: 2023-06-16
  Administered 2023-06-16: 600 [IU] via INTRAMUSCULAR

## 2023-06-16 NOTE — Progress Notes (Signed)
Botox Injection for spasticity using needle EMG guidance Indication: Spastic quadriplegic cerebral palsy (HCC) - Plan: botulinum toxin Type A (BOTOX) injection 600 Units   Dilution: 100 Units/ml        Total Units Injected: 600 Indication: Severe spasticity which interferes with ADL,mobility and/or  hygiene and is unresponsive to medication management and other conservative care Informed consent was obtained after describing risks and benefits of the procedure with the patient. This includes bleeding, bruising, infection, excessive weakness, or medication side effects. A REMS form is on file and signed. G80.0  Bilateral. 1 Needle: 50mm injectable monopolar needle electrode  Number of units per muscle Pectoralis Major 0 units Pectoralis Minor 0 units Biceps 0 units Brachioradialis 0 units FCR 50  units right, 50 units left FCU 50 units right, 50 units left FDS 75 units right, 75 units left FDP 75 units right, 75 units left FPL 50 units right, 50 units left Pronator Teres 0 units Pronator Quadratus 0 units Lumbricals 0 units Quadriceps 0 units Gastroc/soleus 0 units Hamstrings 0 units Tibialis Posterior 0 units Tibialis Anterior 0 units EHL 0 units All injections were done after obtaining appropriate EMG activity and after negative drawback for blood. The patient tolerated the procedure well. Post procedure instructions were given. No follow-ups on file.

## 2023-06-16 NOTE — Patient Instructions (Signed)
ALWAYS FEEL FREE TO CALL OUR OFFICE WITH ANY PROBLEMS OR QUESTIONS ([phone])  **PLEASE NOTE** ALL MEDICATION REFILL REQUESTS (INCLUDING CONTROLLED SUBSTANCES) NEED TO BE MADE AT LEAST 7 DAYS PRIOR TO REFILL BEING DUE. ANY REFILL REQUESTS INSIDE THAT TIME FRAME MAY RESULT IN DELAYS IN RECEIVING YOUR PRESCRIPTION.                    

## 2023-06-18 ENCOUNTER — Ambulatory Visit: Payer: Medicare Other | Admitting: Physician Assistant

## 2023-06-18 ENCOUNTER — Encounter: Payer: Self-pay | Admitting: Physician Assistant

## 2023-06-18 VITALS — BP 98/66 | HR 93 | Temp 98.2°F

## 2023-06-18 DIAGNOSIS — J209 Acute bronchitis, unspecified: Secondary | ICD-10-CM | POA: Diagnosis not present

## 2023-06-18 DIAGNOSIS — H1013 Acute atopic conjunctivitis, bilateral: Secondary | ICD-10-CM

## 2023-06-18 MED ORDER — DOXYCYCLINE HYCLATE 100 MG PO TABS
100.0000 mg | ORAL_TABLET | Freq: Two times a day (BID) | ORAL | 0 refills | Status: AC
Start: 2023-06-18 — End: 2023-06-25

## 2023-06-18 MED ORDER — KETOTIFEN FUMARATE 0.035 % OP SOLN
1.0000 [drp] | Freq: Two times a day (BID) | OPHTHALMIC | 0 refills | Status: DC
Start: 2023-06-18 — End: 2024-02-06

## 2023-06-18 NOTE — Progress Notes (Signed)
Established patient visit   Patient: [name]   DOB: May 03, 1986   37 y.o. Male  MRN: [id number] Visit Date: 06/18/2023  Today's healthcare provider: Alfredia Ferguson, PA-C   Cc. Cough, increased sputum, eye itching  Subjective    HPI   Pt has a history of spastic, quadriplegic cerebral palsy, presents today with his mother. Concerns over increased yellow sputum, cough. Using the vibrating vest a few times a day. Pt also reports eye itching, with some yellow discharge.  Medications: Outpatient Medications Prior to Visit  Medication Sig   albuterol (PROVENTIL) (5 MG/ML) 0.5% nebulizer solution Take 0.5 mLs (2.5 mg total) by nebulization every 6 (six) hours as needed for wheezing or shortness of breath.   AMBULATORY NON FORMULARY MEDICATION Medication Name: MIC gastrostomy/bolus feeding tube 24 French Part number 0110-24. #2 and 10 cc lurer lock syringe #2 Dx:   cetirizine HCl (ZYRTEC) 1 MG/ML solution Take 10 mLs (10 mg total) by mouth daily.   chlorpheniramine-HYDROcodone (TUSSIONEX) 10-8 MG/5ML Take 5 mLs by mouth every 12 (twelve) hours as needed for cough.   ciprofloxacin (CILOXAN) 0.3 % ophthalmic solution PLACE 2 DROPS INTO THE LEFT EYE EVERY 2 HOURS FOR 5 DAYS; THEN 1 DROP EVERY 2 HOURS WHILE AWAKE FOR 2 DAYS; THEN 1 DROP EVERY 4 HOURS WHILE AWAKE FOR 5 DAYS.   dantrolene (DANTRIUM) 50 MG capsule TAKE 1 CAPSULE BY MOUTH EVERY MORNING, 1 CAPSULE EVERY EVENING, AND 1 CAPSULE AT BEDTIME.   DEPAKOTE SPRINKLES 125 MG capsule Take by mouth.   diazepam (VALIUM) 2 MG tablet Take 2 mg by mouth. As needed   diazepam (VALIUM) 5 MG tablet Take 5 mg by mouth 2 (two) times daily.   doxepin (SINEQUAN) 25 MG capsule Take 25 mg by mouth at bedtime.   escitalopram (LEXAPRO) 5 MG/5ML solution Place 20 mLs into feeding tube daily.   HYDROcodone bit-homatropine (HYCODAN) 5-1.5 MG/5ML syrup Take 5 mLs by mouth every 6 (six) hours as needed for cough.   Incontinence Supply Disposable  (PREVAIL BREEZERS MEDIUM) MISC pkg of 16- size medium 32" to 44"  Breathable cloth-like outer fabric (can't use the plastic outer surgace  Item # [id number]   lamoTRIgine (LAMICTAL) 25 MG tablet Take 2 tablets (50 mg total) by mouth 2 (two) times daily.   levofloxacin (LEVAQUIN) 750 MG tablet Crush 1 tablet daily for 7 days.   LORazepam (ATIVAN) 1 MG tablet Take 1 mg by mouth at bedtime.   Misc. Devices (ALL-BODY MASSAGE) MISC 1 Units/hr by Does not apply route as needed. Full body massage for Muscle spasticity due to Cerebral palsy   mupirocin ointment (BACTROBAN) 2 % Apply to area thin film twice daily if needed   NON FORMULARY Bard Leg Bag Extension tubing w/Connector 18", Sterile, latex-free  Item# [id number]   NON FORMULARY Colorplast Freedom Cath Latex Self-Adhering Male External Catheter 31mm Diameter Intermediate  Item# [id number]   NONFORMULARY OR COMPOUNDED Audelia Hives REF [id number] - [place] Joey Pump Set with Flush Bags - 1000 mL   Nutritional Supplements (FEEDING SUPPLEMENT, KATE FARMS STANDARD 1.4,) LIQD liquid Take 325 mLs by mouth as directed. 3 carton over 16 hrs   OLANZapine (ZYPREXA) 10 MG tablet Take 10 mg by mouth at bedtime.   OLANZapine (ZYPREXA) 5 MG tablet Take by mouth.   Omeprazole-Sodium Bicarbonate (ZEGERID) 20-1100 MG CAPS capsule Take 1 capsule by mouth daily before breakfast.   Ostomy Supplies (PROTECTIVE BARRIER WIPES) MISC 1-1/4" X 3"  Item #[id number]  oxyBUTYnin (DITROPAN) 5 MG/5ML solution Take 5 mg by mouth 2 (two) times daily.   sodium chloride HYPERTONIC 3 % nebulizer solution Take by nebulization in the morning and at bedtime.   sucralfate (CARAFATE) 1 g tablet TAKE 1 TABLET BY MOUTH 4 TIMES DAILY WITH MEALS AND AT BEDTIME   traZODone (DESYREL) 50 MG tablet Take 1 tablet (50 mg total) by mouth at bedtime.   No facility-administered medications prior to visit.    Review of Systems  Constitutional:  Negative for fatigue and fever.  Respiratory:   Positive for cough. Negative for shortness of breath.   Cardiovascular:  Negative for chest pain, palpitations and leg swelling.  Neurological:  Negative for dizziness and headaches.       Objective    BP 98/66   Pulse 93   Temp 98.2 F (36.8 C)   SpO2 96%   Physical Exam Vitals reviewed.  Constitutional:      Appearance: He is not ill-appearing.  HENT:     Head: Normocephalic.  Eyes:     Conjunctiva/sclera: Conjunctivae normal.     Comments: Some yellow/clear discharge b/l eyes  Cardiovascular:     Rate and Rhythm: Normal rate.  Pulmonary:     Effort: Pulmonary effort is normal. No respiratory distress.     Breath sounds: No wheezing, rhonchi or rales.  Neurological:     General: No focal deficit present.     Mental Status: He is alert and oriented to person, place, and time.  Psychiatric:        Mood and Affect: Mood normal.        Behavior: Behavior normal.      No results found for any visits on 06/18/23.  Assessment & Plan     1. Acute bronchitis, unspecified organism Given comorbidities, high likelihood of progressing.  Rx doxycyline bid x 7 days Continue w/ vest use to expel sputum, advised mom to watch fluid intake/urine output to determine hydration status No wheezing appreciated, but pt does have nebulize at home. Advised if symptoms worsen or change to call office.  - doxycycline (VIBRA-TABS) 100 MG tablet; Take 1 tablet (100 mg total) by mouth 2 (two) times daily for 7 days.  Dispense: 14 tablet; Refill: 0  2. Allergic conjunctivitis of both eyes Advised getting zaditor eye drops otc  - ketotifen (ZADITOR) 0.035 % ophthalmic solution; Place 1 drop into both eyes in the morning and at bedtime.  Dispense: 5 mL; Refill: 0   Return if symptoms worsen or fail to improve.      I, Alfredia Ferguson, PA-C have reviewed all documentation for this visit. The documentation on  06/18/23   for the exam, diagnosis, procedures, and orders are all accurate and  complete.    Alfredia Ferguson, PA-C  [facility] [phone] (phone) [phone] (fax)  [facility]

## 2023-06-29 ENCOUNTER — Ambulatory Visit (INDEPENDENT_AMBULATORY_CARE_PROVIDER_SITE_OTHER): Payer: Medicare Other | Admitting: Neurology

## 2023-06-29 ENCOUNTER — Telehealth: Payer: Self-pay | Admitting: Family Medicine

## 2023-06-29 ENCOUNTER — Encounter: Payer: Self-pay | Admitting: Neurology

## 2023-06-29 VITALS — BP 108/75 | HR 93 | Ht 60.0 in | Wt 89.0 lb

## 2023-06-29 DIAGNOSIS — R451 Restlessness and agitation: Secondary | ICD-10-CM | POA: Diagnosis not present

## 2023-06-29 DIAGNOSIS — G800 Spastic quadriplegic cerebral palsy: Secondary | ICD-10-CM

## 2023-06-29 MED ORDER — LAMOTRIGINE 50 MG PO TBDP: 50.0000 mg | ORAL_TABLET | Freq: Two times a day (BID) | ORAL | 3 refills | Status: DC

## 2023-06-29 NOTE — Telephone Encounter (Signed)
Forms has been faxed.

## 2023-06-29 NOTE — Telephone Encounter (Signed)
Huntley Dec with [facility] called to follow up on status of the letter of medical necessity that was faxed 06/25/23. It's for a toileting chair and ceiling lift. Please call her at [phone] to advise status.

## 2023-06-29 NOTE — Progress Notes (Signed)
Chief Complaint  Patient presents with   Follow-up    Rm14, mother present  Spastic quadriplegic cerebral palsy: mother stated he seemed to be doing good but having to learn how to deal w/constipation, but that has resolved w/addition of probiotics. Neuro psych change him to lexapro and she stated that has been better for him.       ASSESSMENT AND PLAN  [name] is a 37 y.o. male   Cerebral palsy, progressive worsening spastic quadriplegia, Depression,agitation,  His agitation much improved adding on lamotrigine 50 mg twice a day, also recent change of Cymbalta to liquid form of Lexapro, also on Depakote sprinkle, Zyprexa, Ativan, trazodone  Continue refills through his primary care physician, only return to clinic for new issues  DIAGNOSTIC DATA (LABS, IMAGING, TESTING) - I reviewed patient records, labs, notes, testing and imaging myself where available.   MEDICAL HISTORY:  [name] is a 37 year old male accompanied by his mother and caregiver at today's visit August 18, 2022, seen in request by his primary care physician Dr. Danise Edge for evaluation of increased nighttime agitation, spastic quadriplegia, cerebral palsy,    I reviewed and summarized the referring note. PMHX.  He was born by C-section, his only child of his family, lives with his parents, has caregiver during the day, but only 3 nights each week,  He was noted to have spasticity since he was 31 months old, never reach his milestones, over the years, has gradual worsening spastic quadriplegia, also develop abnormal posturing, high-degree kyphosis, had extensive spine surgery in the past fusion from mid thoracic spine to C2, and the lumbar fusion, previously had baclofen pump in 1999, eventually removed in 2011, for a while, he was given p.o. baclofen, noticed increased agitation, no longer taking it  Previously seen by Dr. Sharene Skeans, also by [facility] neurologist Dr. Karel Jarvis in February 2015 for  episodes of anxiety, agitation, behavior changes, occurring every 6 to 8 weeks, lasting 2 to 3 days, has been followed by [facility] for a long time, receiving regular consultation, was put on Depakote sprinkles are currently taking 125 mg 7 tablets total, also taking Valium, Zyprexa, Cymbalta, dantrolene  He saw sports medicine/pain management Dr. Caren Hazy, was receiving Botox injections regularly, but the painful injection often cause worsening agitation, was not sure about the benefit, no longer receiving it  He could not talk, but per mother he understand most things, communicating through a left elbow controlled electronic pad in front of his wheelchair,  Today the main concern is significant change in his personality since December 2022, his caregiver confirmed that, 2 years ago when she first met him, [name] was happy, enjoying going out, now he tends to feel frustrated during the day, the most bothersome symptoms at nighttime, he can falling to sleep around 10 to 11 PM, but also woke up 3 to 4 hours later, very agitated, difficult to go to sleep, this has caused a lot of stress, interruption of his parents sleep  Reviewed previous note by Dr.Aquino in 2015, it was also a concern of medication management, increased agitation, with a long list of polypharmacy treatment  Mother reported that patient goes through cycles of doing well for few months, then agitation difficulty sleeping for few weeks stretch, then can bounce back to baseline, this particular episode has been ongoing since December 2022,   He recently received orthopedic evaluation, x-ray of right femur 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, osteoarthritis of  both knee, greater on the right, x-ray of bilateral hips showed no acute abnormality  He received injection to bilateral knee following x-ray in August 2023, reported some improvement afterwards, was put on trazodone titrating  dose, which only helped his sleep for few days  He spent daytime in his wheelchair, nighttime in bed, memory foam padded, external catheter, PEG tube feeding since 2016, able to signal his family for bowel movement, last night, he woke his father at 4:30  AM to help him having a bowel movement  Update August 27th 2024: Lamictal was added on since last visit October 2023,  cymbalta was also changed to lexapro liquid form in Spring of 2024, he feels much better, less agitation, but when he get upper respiratory infections, he  can be more agitation, poor sleep at night  He sleeps in adjustable bed, he sounds congested today, was recently treated for upper respiratory infection,   Lab in 2024, normal lipid panel TSH vitamin D, CBC, CMP  CT spine November 2023, solid arthrodesis from C2-L5, no acute abnormality  EEG showed no seizure activity  PHYSICAL EXAM:   Vitals:   06/29/23 1307  BP: 108/75  Pulse: 93  Weight: 89 lb (40.4 kg)  Height: 5' (1.524 m)   Body mass index is 17.38 kg/m.  PHYSICAL EXAMNIATION: Patient sitting in the electronic wheelchair, dysmorphic features  MENTAL STATUS: Speech/cognition: Able to make spastic unintelligible sounds, also try to communicate with his left elbow controlled electronic device on his wheelchair CRANIAL NERVES: CN II: Pupil reactive to light,  CN III, IV, VI: Extraocular movement was full, noticeable small saccadic motion eye movement,spontaneous nystagmus CN V: Facial sensation is intact to light touch CN VII: Face is symmetric with normal eye closure  CN VIII: Hearing is normal to causal conversation. CN IX, X, XI, XII : Phonation slow spastic.Marland Kitchen  Neck extension leaning towards the left side,  MOTOR: Spastic moderate to severe quadriplegia, deformity of bilateral hands, significant limitation on bilateral elbow extension, wrist extension finger extension, bilateral knee maximum 170 degree with passive stretch,  REFLEXES:  Hyperreflexia  SENSORY: Complains of pain with passive stretch  COORDINATION: Needs support in wheelchair  GAIT/STANCE: Nonambulatory  REVIEW OF SYSTEMS:  Full 14 system review of systems performed and notable only for as above All other review of systems were negative.   ALLERGIES: Allergies  Allergen Reactions   Ambien [Zolpidem Tartrate] Nausea Only   Antihistamines, Chlorpheniramine-Type     Other reaction(s): Other (See Comments) Other Reaction: agitation   Augmentin [Amoxicillin-Pot Clavulanate] Diarrhea   Cefdinir Diarrhea    Not tolerate well and cause diarrhea   Codeine Other (See Comments)    Makes patient too active after a few days.   Zolpidem     Other Reaction(s): GI Intolerance   Baclofen Anxiety and Swelling    anxiety   Metoclopramide Other (See Comments) and Swelling    Delusion, emotionality  Other Reaction(s): Mental Status Changes   Pheniramine Rash    Other reaction(s): Other (See Comments)  Other Reaction: agitation  Other Reaction(s): Other (See Comments)  Other reaction(s): Other (See Comments), Other Reaction: agitation   Sulfa Antibiotics Rash   Sulfonamide Derivatives Rash    HOME MEDICATIONS: Current Outpatient Medications  Medication Sig Dispense Refill   albuterol (PROVENTIL) (5 MG/ML) 0.5% nebulizer solution Take 0.5 mLs (2.5 mg total) by nebulization every 6 (six) hours as needed for wheezing or shortness of breath. 20 mL 12   AMBULATORY NON FORMULARY MEDICATION Medication  Name: MIC gastrostomy/bolus feeding tube 24 French Part number 0110-24. #2 and 10 cc lurer lock syringe #2 Dx: 4 Device 2   cetirizine HCl (ZYRTEC) 1 MG/ML solution Take 10 mLs (10 mg total) by mouth daily. 300 mL 0   chlorpheniramine-HYDROcodone (TUSSIONEX) 10-8 MG/5ML Take 5 mLs by mouth every 12 (twelve) hours as needed for cough. 180 mL 0   ciprofloxacin (CILOXAN) 0.3 % ophthalmic solution PLACE 2 DROPS INTO THE LEFT EYE EVERY 2 HOURS FOR 5 DAYS; THEN 1 DROP  EVERY 2 HOURS WHILE AWAKE FOR 2 DAYS; THEN 1 DROP EVERY 4 HOURS WHILE AWAKE FOR 5 DAYS. 5 mL 0   dantrolene (DANTRIUM) 50 MG capsule TAKE 1 CAPSULE BY MOUTH EVERY MORNING, 1 CAPSULE EVERY EVENING, AND 1 CAPSULE AT BEDTIME. 90 capsule 6   DEPAKOTE SPRINKLES 125 MG capsule Take by mouth.     diazepam (VALIUM) 2 MG tablet Take 2 mg by mouth. As needed     diazepam (VALIUM) 5 MG tablet Take 5 mg by mouth 2 (two) times daily.     doxepin (SINEQUAN) 25 MG capsule Take 25 mg by mouth at bedtime.     escitalopram (LEXAPRO) 5 MG/5ML solution Place 20 mLs into feeding tube daily.     HYDROcodone bit-homatropine (HYCODAN) 5-1.5 MG/5ML syrup Take 5 mLs by mouth every 6 (six) hours as needed for cough. 120 mL 0   Incontinence Supply Disposable (PREVAIL BREEZERS MEDIUM) MISC pkg of 16- size medium 32" to 44"  Breathable cloth-like outer fabric (can't use the plastic outer surgace  Item # [id number]/[id number] 16 each 6   ketotifen (ZADITOR) 0.035 % ophthalmic solution Place 1 drop into both eyes in the morning and at bedtime. 5 mL 0   lamoTRIgine (LAMICTAL) 25 MG tablet Take 2 tablets (50 mg total) by mouth 2 (two) times daily. 120 tablet 6   levofloxacin (LEVAQUIN) 750 MG tablet Crush 1 tablet daily for 7 days. 7 tablet 0   LORazepam (ATIVAN) 1 MG tablet Take 1 mg by mouth at bedtime.     Misc. Devices (ALL-BODY MASSAGE) MISC 1 Units/hr by Does not apply route as needed. Full body massage for Muscle spasticity due to Cerebral palsy 99 each 99   mupirocin ointment (BACTROBAN) 2 % Apply to area thin film twice daily if needed 22 g 0   NON FORMULARY Bard Leg Bag Extension tubing w/Connector 18", Sterile, latex-free  Item# [id number]     NON FORMULARY Colorplast Freedom Cath Latex Self-Adhering Male External Catheter 31mm Diameter Intermediate  Item# [id number]     NONFORMULARY OR COMPOUNDED ITEM [employer] REF [id number] - Kangaroo Joey Pump Set with Flush Bags - 1000 mL 1 each 0   Nutritional Supplements (FEEDING SUPPLEMENT,  KATE FARMS STANDARD 1.4,) LIQD liquid Take 325 mLs by mouth as directed. 3 carton over 16 hrs     OLANZapine (ZYPREXA) 10 MG tablet Take 10 mg by mouth at bedtime.     OLANZapine (ZYPREXA) 5 MG tablet Take by mouth.     Omeprazole-Sodium Bicarbonate (ZEGERID) 20-1100 MG CAPS capsule Take 1 capsule by mouth daily before breakfast.     Ostomy Supplies (PROTECTIVE BARRIER WIPES) MISC 1-1/4" X 3"  Item #[id number] 75 each 6   oxyBUTYnin (DITROPAN) 5 MG/5ML solution Take 5 mg by mouth 2 (two) times daily.     sodium chloride HYPERTONIC 3 % nebulizer solution Take by nebulization in the morning and at bedtime. 750 mL 12   sucralfate (CARAFATE) 1 g tablet TAKE 1  TABLET BY MOUTH 4 TIMES DAILY WITH MEALS AND AT BEDTIME 120 tablet 1   traZODone (DESYREL) 50 MG tablet Take 1 tablet (50 mg total) by mouth at bedtime.     No current facility-administered medications for this visit.    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   GERD (gastroesophageal reflux disease)    Hyperlipidemia, mild 08/25/2015   Hyperthyroidism    Incontinence of feces    Loss of weight 08/28/2014   Medicare annual wellness visit, subsequent 08/25/2015   Mildly underweight adult 03/16/2017   Palpitations    PALSY, INFANTILE CEREBRAL, 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   Qualifier: Diagnosis of  By: Janit Bern  Working with PMR Dr Hermelinda Medicus and tolerating Botox injections in hips, shoulders, etc with good results   Skin lesion of right ear 03/16/2017   Thyroid disease 08/01/2010   Qualifier: Diagnosis of  By: Candice Camp CMA (AAMA), Dottie      PAST SURGICAL HISTORY: Past Surgical History:  Procedure Laterality Date   baclofen trial     baslofen pump implant     ears tubes      EYE SURGERY     FLEXIBLE SIGMOIDOSCOPY N/A 09/07/2014   Procedure: FLEXIBLE SIGMOIDOSCOPY;  Surgeon: Beverley Fiedler, MD;  Location: [facility];  Service: Endoscopy;  Laterality: N/A;   g-tube insert  August 2006   hamstring released     to treat contractures.    HIP SURGERY     x2 , side    IR CM INJ ANY COLONIC TUBE W/FLUORO  05/28/2017   IR CM INJ ANY COLONIC TUBE W/FLUORO  07/07/2019   IR CM INJ ANY COLONIC TUBE W/FLUORO  09/18/2019   IR GASTR TUBE CONVERT GASTR-JEJ PER W/FL MOD SED  03/14/2019   IR GENERIC HISTORICAL  07/01/2016   IR GASTR TUBE CONVERT GASTR-JEJ PER W/FL MOD SED 07/01/2016 Irish Lack, MD [facility]   IR GENERIC HISTORICAL  07/08/2016   IR PATIENT EVAL TECH 0-60 MINS 07/08/2016 Irish Lack, MD [facility]   IR GENERIC HISTORICAL  07/14/2016   IR GJ TUBE CHANGE 07/14/2016 Simonne Come, MD [facility]   IR GENERIC HISTORICAL  07/21/2016   IR PATIENT EVAL TECH 0-60 MINS [facility]   IR GENERIC HISTORICAL  08/31/2016   IR REPLC DUODEN/JEJUNO TUBE PERCUT W/FLUORO 08/31/2016 Berdine Dance, MD [facility]   IR GENERIC HISTORICAL  09/03/2016   IR GJ TUBE CHANGE 09/03/2016 Simonne Come, MD [facility]   IR GENERIC HISTORICAL  09/10/2016   IR GASTR TUBE CONVERT GASTR-JEJ PER W/FL MOD SED 09/10/2016 [facility]   IR GENERIC HISTORICAL  09/16/2016   IR PATIENT EVAL TECH 0-60 MINS [facility]   IR GENERIC HISTORICAL  09/29/2016   IR GJ TUBE CHANGE 09/29/2016 Oley Balm, MD [facility]   IR GJ TUBE CHANGE  02/05/2017   IR GJ TUBE CHANGE  05/21/2017   IR GJ TUBE CHANGE  08/25/2017   IR GJ TUBE CHANGE  01/18/2018   IR GJ TUBE CHANGE  02/04/2018   IR GJ TUBE CHANGE  04/21/2018   IR GJ TUBE CHANGE  08/18/2018   IR GJ TUBE CHANGE  09/12/2019   IR GJ TUBE CHANGE  01/03/2020   IR  GJ TUBE CHANGE  05/13/2020   IR GJ TUBE CHANGE  09/11/2020   IR GJ TUBE CHANGE  02/27/2021   IR GJ TUBE CHANGE  08/01/2021   IR GJ TUBE CHANGE  12/31/2021   IR GJ TUBE CHANGE  05/11/2022   IR GJ TUBE  CHANGE  07/01/2022   IR GJ TUBE CHANGE  01/28/2023   IR MECH REMOV OBSTRUC MAT ANY COLON TUBE W/FLUORO  09/02/2018   IR REPLC GASTRO/COLONIC TUBE PERCUT W/FLUORO  10/17/2018   IR REPLC GASTRO/COLONIC TUBE PERCUT W/FLUORO  04/02/2023   PEG PLACEMENT  10/21/2011   Procedure: PERCUTANEOUS ENDOSCOPIC GASTROSTOMY (PEG) REPLACEMENT;  Surgeon: Hart Carwin, MD;  Location: [facility];  Service: Endoscopy;  Laterality: N/A;   PEG PLACEMENT N/A 06/13/2013   Procedure: PERCUTANEOUS ENDOSCOPIC GASTROSTOMY (PEG) REPLACEMENT;  Surgeon: Hart Carwin, MD;  Location: [facility];  Service: Endoscopy;  Laterality: N/A;   SPINAL FUSION     spinal fusion to correct 70 degree kyphosis  11-2010   spinal fusioncorrect 106 degree kyphosis     SPINE SURGERY  ,11/20/2010, 2011   for correction of severe contracturing spinal kyphosis.    TONSILLECTOMY      FAMILY HISTORY: Family History  Problem Relation Age of Onset   Asthma Mother    Hyperlipidemia Mother    COPD Mother    Other Mother        bronchial stasis/ABPA   Cancer Maternal Grandmother 39       breast   Hyperlipidemia Maternal Grandmother    Hypertension Maternal Grandmother    Cancer Maternal Grandfather        prostate   Heart disease Paternal Grandfather        CHF   Osteoporosis Paternal Grandmother    Arthritis Paternal Grandmother        rheumatoid    SOCIAL HISTORY: Social History   Socioeconomic History   Marital status: Single    Spouse name: Not on file   Number of children: 0   Years of education: Not on file   Highest education level: Not on file  Occupational History   Occupation: disbaled  Tobacco Use   Smoking status: Never   Smokeless tobacco: Never  Vaping Use   Vaping status: Never Used  Substance and Sexual Activity   Alcohol use: No   Drug use: No   Sexual activity: Never  Other Topics Concern   Not on file  Social History Narrative   Not on file   Social Determinants of Health   Financial Resource  Strain: Not on file  Food Insecurity: Not on file  Transportation Needs: Not on file  Physical Activity: Not on file  Stress: Not on file  Social Connections: Not on file  Intimate Partner Violence: Not on file      Levert Feinstein, M.D. Ph.D.  [facility] [address], [address] [place], [place] [address] Ph: [phone] Fax: [phone]  CC:  Bradd Canary, MD [address] [place],  [place] [address]  Bradd Canary, MD

## 2023-06-30 ENCOUNTER — Telehealth: Payer: Self-pay | Admitting: Family Medicine

## 2023-06-30 ENCOUNTER — Ambulatory Visit: Payer: Medicare Other | Admitting: Physician Assistant

## 2023-06-30 NOTE — Telephone Encounter (Signed)
Called pt mother and they are coming in today for follow up with Lindsay,PA.

## 2023-06-30 NOTE — Progress Notes (Deleted)
Established patient visit   Patient: [name]   DOB: 05/09/1986   37 y.o. Male  MRN: [id number] Visit Date: 06/30/2023  Today's healthcare provider: Alfredia Ferguson, PA-C   No chief complaint on file.  Subjective    HPI  ***  Medications: Outpatient Medications Prior to Visit  Medication Sig   albuterol (PROVENTIL) (5 MG/ML) 0.5% nebulizer solution Take 0.5 mLs (2.5 mg total) by nebulization every 6 (six) hours as needed for wheezing or shortness of breath.   AMBULATORY NON FORMULARY MEDICATION Medication Name: MIC gastrostomy/bolus feeding tube 24 French Part number 0110-24. #2 and 10 cc lurer lock syringe #2 Dx:   cetirizine HCl (ZYRTEC) 1 MG/ML solution Take 10 mLs (10 mg total) by mouth daily.   chlorpheniramine-HYDROcodone (TUSSIONEX) 10-8 MG/5ML Take 5 mLs by mouth every 12 (twelve) hours as needed for cough.   ciprofloxacin (CILOXAN) 0.3 % ophthalmic solution PLACE 2 DROPS INTO THE LEFT EYE EVERY 2 HOURS FOR 5 DAYS; THEN 1 DROP EVERY 2 HOURS WHILE AWAKE FOR 2 DAYS; THEN 1 DROP EVERY 4 HOURS WHILE AWAKE FOR 5 DAYS.   dantrolene (DANTRIUM) 50 MG capsule TAKE 1 CAPSULE BY MOUTH EVERY MORNING, 1 CAPSULE EVERY EVENING, AND 1 CAPSULE AT BEDTIME.   DEPAKOTE SPRINKLES 125 MG capsule Take by mouth.   diazepam (VALIUM) 2 MG tablet Take 2 mg by mouth. As needed   diazepam (VALIUM) 5 MG tablet Take 5 mg by mouth 2 (two) times daily.   doxepin (SINEQUAN) 25 MG capsule Take 25 mg by mouth at bedtime.   escitalopram (LEXAPRO) 5 MG/5ML solution Place 20 mLs into feeding tube daily.   HYDROcodone bit-homatropine (HYCODAN) 5-1.5 MG/5ML syrup Take 5 mLs by mouth every 6 (six) hours as needed for cough.   Incontinence Supply Disposable (PREVAIL BREEZERS MEDIUM) MISC pkg of 16- size medium 32" to 44"  Breathable cloth-like outer fabric (can't use the plastic outer surgace  Item # PVB-012/2   ketotifen (ZADITOR) 0.035 % ophthalmic solution Place 1 drop into both eyes in the morning  and at bedtime.   lamoTRIgine (LAMICTAL) 25 MG tablet Take 2 tablets (50 mg total) by mouth 2 (two) times daily.   lamoTRIgine 50 MG TBDP Take 1 tablet (50 mg total) by mouth 2 (two) times daily.   levofloxacin (LEVAQUIN) 750 MG tablet Crush 1 tablet daily for 7 days.   LORazepam (ATIVAN) 1 MG tablet Take 1 mg by mouth at bedtime.   Misc. Devices (ALL-BODY MASSAGE) MISC 1 Units/hr by Does not apply route as needed. Full body massage for Muscle spasticity due to Cerebral palsy   mupirocin ointment (BACTROBAN) 2 % Apply to area thin film twice daily if needed   NON FORMULARY Bard Leg Bag Extension tubing w/Connector 18", Sterile, latex-free  Item# [id number]   NON FORMULARY Colorplast Freedom Cath Latex Self-Adhering Male External Catheter 31mm Diameter Intermediate  Item# [id number]   NONFORMULARY OR COMPOUNDED Audelia Hives REF [id number] - Kangaroo Joey Pump Set with Flush Bags - 1000 mL   Nutritional Supplements (FEEDING SUPPLEMENT, KATE FARMS STANDARD 1.4,) LIQD liquid Take 325 mLs by mouth as directed. 3 carton over 16 hrs   OLANZapine (ZYPREXA) 10 MG tablet Take 10 mg by mouth at bedtime.   OLANZapine (ZYPREXA) 5 MG tablet Take by mouth.   Omeprazole-Sodium Bicarbonate (ZEGERID) 20-1100 MG CAPS capsule Take 1 capsule by mouth daily before breakfast.   Ostomy Supplies (PROTECTIVE BARRIER WIPES) MISC 1-1/4" X 3"  Item #[id number]  oxyBUTYnin (DITROPAN) 5 MG/5ML solution Take 5 mg by mouth 2 (two) times daily.   sodium chloride HYPERTONIC 3 % nebulizer solution Take by nebulization in the morning and at bedtime.   sucralfate (CARAFATE) 1 g tablet TAKE 1 TABLET BY MOUTH 4 TIMES DAILY WITH MEALS AND AT BEDTIME   traZODone (DESYREL) 50 MG tablet Take 1 tablet (50 mg total) by mouth at bedtime.   No facility-administered medications prior to visit.    Review of Systems    Objective    There were no vitals taken for this visit.  Physical Exam  ***  No results found for any visits on  06/30/23.  Assessment & Plan     ***  No follow-ups on file.      I, Alfredia Ferguson, PA-C have reviewed all documentation for this visit. The documentation on  06/30/23   for the exam, diagnosis, procedures, and orders are all accurate and complete.    Alfredia Ferguson, PA-C  [facility] [phone] (phone) [phone] (fax)  [facility]

## 2023-06-30 NOTE — Telephone Encounter (Signed)
Pt's mother states his respiratory infection has come back and they are not sure what to do. She states if an appt if necessary virtual would be better. Saw Lillia Abed last.

## 2023-07-01 ENCOUNTER — Other Ambulatory Visit: Payer: Self-pay | Admitting: Physical Medicine & Rehabilitation

## 2023-07-01 DIAGNOSIS — G8250 Quadriplegia, unspecified: Secondary | ICD-10-CM

## 2023-07-01 DIAGNOSIS — G809 Cerebral palsy, unspecified: Secondary | ICD-10-CM

## 2023-07-13 ENCOUNTER — Telehealth: Payer: Self-pay

## 2023-07-13 NOTE — Telephone Encounter (Signed)
*  [facility]  Pharmacy Patient Advocate Encounter   Received notification from CoverMyMeds that prior authorization for lamoTRIgine 50MG  dispersible tablets  is required/requested.   Insurance verification completed.   The patient is insured through [facility] .   Per test claim: PA required; PA submitted to [facility] via CoverMyMeds Key/confirmation #/EOC [id number] Status is pending

## 2023-07-15 ENCOUNTER — Other Ambulatory Visit: Payer: Self-pay | Admitting: Internal Medicine

## 2023-07-16 ENCOUNTER — Telehealth: Payer: Self-pay | Admitting: Gastroenterology

## 2023-07-16 ENCOUNTER — Telehealth: Payer: Self-pay | Admitting: Internal Medicine

## 2023-07-16 MED ORDER — SUCRALFATE 1 G PO TABS: 1.0000 g | ORAL_TABLET | Freq: Three times a day (TID) | ORAL | 1 refills | Status: DC

## 2023-07-16 NOTE — Telephone Encounter (Signed)
Error

## 2023-07-16 NOTE — Telephone Encounter (Signed)
Prescription sent to the pharmacy.

## 2023-07-16 NOTE — Telephone Encounter (Signed)
Patients mother called to request a refill for Carafate also scheduled an appt to follow in December. Asking if a refill can be sent until then. Please advise.

## 2023-07-19 ENCOUNTER — Other Ambulatory Visit (HOSPITAL_COMMUNITY): Payer: Self-pay

## 2023-07-19 NOTE — Telephone Encounter (Signed)
Pharmacy Patient Advocate Encounter  Received notification from [facility] that Prior Authorization for lamoTRIgine 50MG  dispersible tabletshas been APPROVED from 07/14/2023 to 11/01/2098. Ran test claim, Copay is $0 per 90DS. This test claim was processed through [facility]- copay amounts may vary at other pharmacies due to pharmacy/plan contracts, or as the patient moves through the different stages of their insurance plan.   PA #/Case ID/Reference #: PA Case ID #: [id number]

## 2023-07-28 DIAGNOSIS — F339 Major depressive disorder, recurrent, unspecified: Secondary | ICD-10-CM | POA: Diagnosis not present

## 2023-07-28 DIAGNOSIS — G4700 Insomnia, unspecified: Secondary | ICD-10-CM | POA: Diagnosis not present

## 2023-07-29 ENCOUNTER — Ambulatory Visit: Payer: Medicare Other | Admitting: Physician Assistant

## 2023-07-29 ENCOUNTER — Encounter: Payer: Self-pay | Admitting: Physician Assistant

## 2023-07-29 VITALS — BP 102/70 | HR 91 | Temp 97.5°F | Resp 18 | Ht 60.0 in

## 2023-07-29 DIAGNOSIS — R0989 Other specified symptoms and signs involving the circulatory and respiratory systems: Secondary | ICD-10-CM | POA: Diagnosis not present

## 2023-07-29 MED ORDER — DOXYCYCLINE HYCLATE 100 MG PO TABS: 100.0000 mg | ORAL_TABLET | Freq: Two times a day (BID) | ORAL | 0 refills | Status: DC

## 2023-07-29 NOTE — Progress Notes (Signed)
Established patient visit   Patient: [name]   DOB: [birth year]   37 y.o. Male  MRN: [id number] Visit Date: 07/29/2023  Today's healthcare provider: Alfredia Ferguson, PA-C   Cc. Cough, congesiton x 1 day  Subjective    HPI   Pt is accompanied by his father today. He reports increased secretions, cough as of yesterday. Denies fever. Using mucinex.   Medications: Outpatient Medications Prior to Visit  Medication Sig   AMBULATORY NON FORMULARY MEDICATION Medication Name: MIC gastrostomy/bolus feeding tube 24 French Part number 0110-24. #2 and 10 cc lurer lock syringe #2 Dx:   cetirizine HCl (ZYRTEC) 1 MG/ML solution Take 10 mLs (10 mg total) by mouth daily.   dantrolene (DANTRIUM) 50 MG capsule TAKE 1 CAPSULE BY MOUTH EVERY MORNING, 1 CAPSULE EVERY EVENING, AND 1 CAPSULE AT BEDTIME.   DEPAKOTE SPRINKLES 125 MG capsule Take by mouth.   diazepam (VALIUM) 5 MG tablet Take 5 mg by mouth 2 (two) times daily.   doxepin (SINEQUAN) 25 MG capsule Take 25 mg by mouth at bedtime.   escitalopram (LEXAPRO) 5 MG/5ML solution Place 20 mLs into feeding tube daily.   Incontinence Supply Disposable (PREVAIL BREEZERS MEDIUM) MISC pkg of 16- size medium 32" to 44"  Breathable cloth-like outer fabric (can't use the plastic outer surgace  Item # PVB-[id number]   ketotifen (ZADITOR) 0.035 % ophthalmic solution Place 1 drop into both eyes in the morning and at bedtime.   lamoTRIgine (LAMICTAL) 25 MG tablet Take 2 tablets (50 mg total) by mouth 2 (two) times daily.   lamoTRIgine 50 MG TBDP Take 1 tablet (50 mg total) by mouth 2 (two) times daily.   LORazepam (ATIVAN) 1 MG tablet Take 1 mg by mouth at bedtime.   Misc. Devices (ALL-BODY MASSAGE) MISC 1 Units/hr by Does not apply route as needed. Full body massage for Muscle spasticity due to Cerebral palsy   mupirocin ointment (BACTROBAN) 2 % Apply to area thin film twice daily if needed   NON FORMULARY Bard Leg Bag Extension tubing w/Connector  18", Sterile, latex-free  Item# [id number]   NON FORMULARY Colorplast Freedom Cath Latex Self-Adhering Male External Catheter 31mm Diameter Intermediate  Item# [id number]   NONFORMULARY OR COMPOUNDED Audelia Hives REF [id number] - Kangaroo Joey Pump Set with Flush Bags - 1000 mL   Nutritional Supplements (FEEDING SUPPLEMENT, KATE FARMS STANDARD 1.4,) LIQD liquid Take 325 mLs by mouth as directed. 3 carton over 16 hrs   OLANZapine (ZYPREXA) 10 MG tablet Take 10 mg by mouth at bedtime.   OLANZapine (ZYPREXA) 5 MG tablet Take by mouth.   Omeprazole-Sodium Bicarbonate (ZEGERID) 20-1100 MG CAPS capsule Take 1 capsule by mouth daily before breakfast.   Ostomy Supplies (PROTECTIVE BARRIER WIPES) MISC 1-1/4" X 3"  Item #[id number]   oxyBUTYnin (DITROPAN) 5 MG/5ML solution Take 5 mg by mouth 2 (two) times daily.   sodium chloride HYPERTONIC 3 % nebulizer solution Take by nebulization in the morning and at bedtime.   sucralfate (CARAFATE) 1 g tablet Take 1 tablet (1 g total) by mouth 4 (four) times daily -  before meals and at bedtime. TAKE 1 TABLET BY MOUTH 4 TIMES DAILY WITH MEALS AND AT BEDTIME   traZODone (DESYREL) 50 MG tablet Take 1 tablet (50 mg total) by mouth at bedtime.   albuterol (PROVENTIL) (5 MG/ML) 0.5% nebulizer solution Take 0.5 mLs (2.5 mg total) by nebulization every 6 (six) hours as needed for wheezing or shortness of breath. (Patient  not taking: Reported on 07/29/2023)   levofloxacin (LEVAQUIN) 750 MG tablet Crush 1 tablet daily for 7 days. (Patient not taking: Reported on 07/29/2023)   [DISCONTINUED] chlorpheniramine-HYDROcodone (TUSSIONEX) 10-8 MG/5ML Take 5 mLs by mouth every 12 (twelve) hours as needed for cough.   [DISCONTINUED] ciprofloxacin (CILOXAN) 0.3 % ophthalmic solution PLACE 2 DROPS INTO THE LEFT EYE EVERY 2 HOURS FOR 5 DAYS; THEN 1 DROP EVERY 2 HOURS WHILE AWAKE FOR 2 DAYS; THEN 1 DROP EVERY 4 HOURS WHILE AWAKE FOR 5 DAYS.   [DISCONTINUED] diazepam (VALIUM) 2 MG tablet Take 2 mg by  mouth. As needed   [DISCONTINUED] HYDROcodone bit-homatropine (HYCODAN) 5-1.5 MG/5ML syrup Take 5 mLs by mouth every 6 (six) hours as needed for cough.   No facility-administered medications prior to visit.    Review of Systems  Constitutional:  Negative for fatigue and fever.  Respiratory:  Positive for cough. Negative for shortness of breath.   Cardiovascular:  Negative for chest pain, palpitations and leg swelling.  Neurological:  Negative for dizziness and headaches.      Objective    BP 102/70   Pulse 91   Temp (!) 97.5 F (36.4 C) (Axillary)   Resp 18   Ht 5' (1.524 m)   SpO2 97%   BMI 17.38 kg/m   Physical Exam Constitutional:      General: He is awake.     Appearance: He is well-developed.  HENT:     Head: Normocephalic.  Eyes:     Conjunctiva/sclera: Conjunctivae normal.  Cardiovascular:     Rate and Rhythm: Normal rate and regular rhythm.     Heart sounds: Normal heart sounds.  Pulmonary:     Effort: Pulmonary effort is normal.     Breath sounds: Examination of the right-upper field reveals decreased breath sounds. Decreased breath sounds present.     Comments: Slightly muffled breath sound right upper lung Skin:    General: Skin is warm.  Neurological:     Mental Status: He is alert and oriented to person, place, and time.  Psychiatric:        Attention and Perception: Attention normal.        Mood and Affect: Mood normal.        Speech: Speech normal.        Behavior: Behavior is cooperative.      No results found for any visits on 07/29/23.  Assessment & Plan     1. Chest congestion Vitals rechecked and stable  Gave strict ED guidelines,if no improvement through the weekend, but no worsening, would order cxr.  Rx doxycyline bid x 7 days , encouraged use of nebs, vest, mucinex  - doxycycline (VIBRA-TABS) 100 MG tablet; Take 1 tablet (100 mg total) by mouth 2 (two) times daily.  Dispense: 14 tablet; Refill: 0   No follow-ups on file.       I, Alfredia Ferguson, PA-C have reviewed all documentation for this visit. The documentation on  07/29/23   for the exam, diagnosis, procedures, and orders are all accurate and complete.    Alfredia Ferguson, PA-C  [facility] [phone] (phone) [phone] (fax)  [facility]

## 2023-08-02 NOTE — Assessment & Plan Note (Signed)
Continues NPO with gastrostomy tube feeds

## 2023-08-02 NOTE — Assessment & Plan Note (Signed)
Doing well on current meds 

## 2023-08-02 NOTE — Assessment & Plan Note (Signed)
Doing well at home cared for by his mother

## 2023-08-02 NOTE — Assessment & Plan Note (Signed)
Encourage heart healthy diet such as MIND or DASH diet, increase exercise, avoid trans fats, simple carbohydrates and processed foods, consider a krill or fish or flaxseed oil cap daily.  °

## 2023-08-02 NOTE — Assessment & Plan Note (Signed)
Tube managed well by his mother

## 2023-08-03 ENCOUNTER — Encounter: Payer: Self-pay | Admitting: Family Medicine

## 2023-08-03 ENCOUNTER — Ambulatory Visit (INDEPENDENT_AMBULATORY_CARE_PROVIDER_SITE_OTHER): Payer: Medicare Other | Admitting: Family Medicine

## 2023-08-03 VITALS — BP 112/80 | HR 81 | Temp 98.0°F | Resp 16 | Ht 61.0 in | Wt 89.0 lb

## 2023-08-03 DIAGNOSIS — G4700 Insomnia, unspecified: Secondary | ICD-10-CM | POA: Diagnosis not present

## 2023-08-03 DIAGNOSIS — Z931 Gastrostomy status: Secondary | ICD-10-CM | POA: Diagnosis not present

## 2023-08-03 DIAGNOSIS — E43 Unspecified severe protein-calorie malnutrition: Secondary | ICD-10-CM | POA: Diagnosis not present

## 2023-08-03 DIAGNOSIS — F418 Other specified anxiety disorders: Secondary | ICD-10-CM | POA: Diagnosis not present

## 2023-08-03 DIAGNOSIS — R300 Dysuria: Secondary | ICD-10-CM

## 2023-08-03 DIAGNOSIS — E785 Hyperlipidemia, unspecified: Secondary | ICD-10-CM

## 2023-08-03 DIAGNOSIS — G809 Cerebral palsy, unspecified: Secondary | ICD-10-CM

## 2023-08-03 DIAGNOSIS — J690 Pneumonitis due to inhalation of food and vomit: Secondary | ICD-10-CM

## 2023-08-03 MED ORDER — LEVOFLOXACIN 500 MG PO TABS: 500.0000 mg | ORAL_TABLET | Freq: Every day | ORAL | 0 refills | Status: DC

## 2023-08-03 NOTE — Patient Instructions (Signed)
Guaifenasin is plain Mucinex and plain Robitussin

## 2023-08-03 NOTE — Assessment & Plan Note (Signed)
Has been more agitated and coughing that has kept him from sleeping at night, can use Hydromet to help prn

## 2023-08-03 NOTE — Assessment & Plan Note (Signed)
Not responding to Doxycycline will stop and then start Levaquin 500 mg po daily. If no improvement will need a CXR

## 2023-08-03 NOTE — Progress Notes (Signed)
Subjective:    Patient ID: [name], male    DOB: 05/23/1986, 37 y.o.   MRN: [id number]  Chief Complaint  Patient presents with  . Follow-up    Follow up and dysuria    HPI Discussed the use of AI scribe software for clinical note transcription with the patient, who gave verbal consent to proceed.  History of Present Illness   The patient, with a history of respiratory issues and UTIs, has been experiencing worsening respiratory symptoms for the past 10 days. The symptoms include chest congestion, increased coughing, and sleep disturbances. Despite being on doxycycline for the past five days, there has been no improvement in his condition. The coughing has been persistent and the mucus has become thicker and colored. The patient also complains of a burning sensation with urination and abdominal pain. The caregiver reports that the patient's condition has been deteriorating since last week. The patient has been using Mucinex for symptom relief.        Past Medical History:  Diagnosis Date  . Cerebral palsy (HCC)   . Dehydration 11/22/2013  . Depression with anxiety 08/01/2010   Qualifier: Diagnosis of  By: Nelson-Smith CMA (AAMA), Dottie    . Dyslipidemia 08/19/2017  . Esophagitis 2011  . 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 GERD (gastroesophageal reflux disease)   . Hyperlipidemia, mild 08/25/2015  . Hyperthyroidism   . Incontinence of feces   . Loss of weight 08/28/2014  . Medicare annual wellness visit, subsequent 08/25/2015  . Mildly underweight adult 03/16/2017  . Palpitations   . 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 Skin lesion of right ear 03/16/2017  . Thyroid disease 08/01/2010   Qualifier: Diagnosis of  By: Candice Camp CMA (AAMA), Dottie      Past Surgical History:  Procedure Laterality Date   . baclofen trial    . baslofen pump implant    . ears tubes    . EYE SURGERY    . FLEXIBLE SIGMOIDOSCOPY N/A 09/07/2014   Procedure: FLEXIBLE SIGMOIDOSCOPY;  Surgeon: Beverley Fiedler, MD;  Location: [facility] ENDOSCOPY;  Service: Endoscopy;  Laterality: N/A;  . g-tube insert  August 2006  . hamstring released     to treat contractures.   Marland Kitchen HIP SURGERY     x2 , side   . IR CM INJ ANY COLONIC TUBE W/FLUORO  05/28/2017  . IR CM INJ ANY COLONIC TUBE W/FLUORO  07/07/2019  . IR CM INJ ANY COLONIC TUBE W/FLUORO  09/18/2019  . IR GASTR TUBE CONVERT GASTR-JEJ PER W/FL MOD SED  03/14/2019  . IR GENERIC HISTORICAL  07/01/2016   IR GASTR TUBE CONVERT GASTR-JEJ PER W/FL MOD SED 07/01/2016 Irish Lack, MD [facility]  . IR GENERIC HISTORICAL  07/08/2016   IR PATIENT EVAL TECH 0-60 MINS 07/08/2016 Irish Lack, MD [facility]  . IR GENERIC HISTORICAL  07/14/2016   IR GJ TUBE CHANGE 07/14/2016 Simonne Come, MD [facility]  . IR GENERIC HISTORICAL  07/21/2016   IR PATIENT EVAL TECH 0-60 MINS [facility]  . IR GENERIC HISTORICAL  08/31/2016   IR REPLC DUODEN/JEJUNO TUBE PERCUT W/FLUORO 08/31/2016 Berdine Dance, MD [facility]  . IR GENERIC HISTORICAL  09/03/2016   IR GJ TUBE CHANGE 09/03/2016 Simonne Come, MD [facility]  . IR GENERIC HISTORICAL  09/10/2016   IR  GASTR TUBE CONVERT GASTR-JEJ PER W/FL MOD SED 09/10/2016 [facility]  . IR GENERIC HISTORICAL  09/16/2016   IR PATIENT EVAL TECH 0-60 MINS [facility]  . IR GENERIC HISTORICAL  09/29/2016   IR GJ TUBE CHANGE 09/29/2016 Oley Balm, MD [facility]  . IR GJ TUBE CHANGE  02/05/2017  . IR GJ TUBE CHANGE  05/21/2017  . IR GJ TUBE CHANGE  08/25/2017  . IR GJ TUBE CHANGE  01/18/2018  . IR GJ TUBE CHANGE  02/04/2018  . IR GJ TUBE CHANGE  04/21/2018  . IR GJ TUBE CHANGE  08/18/2018  . IR GJ TUBE CHANGE  09/12/2019  . IR GJ TUBE CHANGE  01/03/2020  . IR GJ TUBE CHANGE  05/13/2020  . IR GJ TUBE CHANGE  09/11/2020  . IR GJ TUBE CHANGE  02/27/2021  . IR GJ  TUBE CHANGE  08/01/2021  . IR GJ TUBE CHANGE  12/31/2021  . IR GJ TUBE CHANGE  05/11/2022  . IR GJ TUBE CHANGE  07/01/2022  . IR GJ TUBE CHANGE  01/28/2023  . IR MECH REMOV OBSTRUC MAT ANY COLON TUBE W/FLUORO  09/02/2018  . IR REPLC GASTRO/COLONIC TUBE PERCUT W/FLUORO  10/17/2018  . IR REPLC GASTRO/COLONIC TUBE PERCUT W/FLUORO  04/02/2023  . PEG PLACEMENT  10/21/2011   Procedure: PERCUTANEOUS ENDOSCOPIC GASTROSTOMY (PEG) REPLACEMENT;  Surgeon: Hart Carwin, MD;  Location: [facility]OSCOPY;  Service: Endoscopy;  Laterality: N/A;  . PEG PLACEMENT N/A 06/13/2013   Procedure: PERCUTANEOUS ENDOSCOPIC GASTROSTOMY (PEG) REPLACEMENT;  Surgeon: Hart Carwin, MD;  Location: [facility];  Service: Endoscopy;  Laterality: N/A;  . SPINAL FUSION    . spinal fusion to correct 70 degree kyphosis  11-2010  . spinal fusioncorrect 106 degree kyphosis    . SPINE SURGERY  ,11/20/2010, 2011   for correction of severe contracturing spinal kyphosis.   . TONSILLECTOMY      Family History  Problem Relation Age of Onset  . Asthma Mother   . Hyperlipidemia Mother   . COPD Mother   . Other Mother        bronchial stasis/ABPA  . Cancer Maternal Grandmother 64       breast  . Hyperlipidemia Maternal Grandmother   . Hypertension Maternal Grandmother   . Cancer Maternal Grandfather        prostate  . Heart disease Paternal Grandfather        CHF  . Osteoporosis Paternal Grandmother   . Arthritis Paternal Grandmother        rheumatoid    Social History   Socioeconomic History  . Marital status: Single    Spouse name: Not on file  . Number of children: 0  . Years of education: Not on file  . Highest education level: Not on file  Occupational History  . Occupation: disbaled  Tobacco Use  . Smoking status: Never  . Smokeless tobacco: Never  Vaping Use  . Vaping status: Never Used  Substance and Sexual Activity  . Alcohol use: No  . Drug use: No  . Sexual activity: Never  Other Topics Concern  . Not on  file  Social History Narrative  . Not on file   Social Determinants of Health   Financial Resource Strain: Not on file  Food Insecurity: Not on file  Transportation Needs: Not on file  Physical Activity: Not on file  Stress: Not on file  Social Connections: Not on file  Intimate Partner Violence: Not on file  Outpatient Medications Prior to Visit  Medication Sig Dispense Refill  . albuterol (PROVENTIL) (5 MG/ML) 0.5% nebulizer solution Take 0.5 mLs (2.5 mg total) by nebulization every 6 (six) hours as needed for wheezing or shortness of breath. 20 mL 12  . AMBULATORY NON FORMULARY MEDICATION Medication Name: MIC gastrostomy/bolus feeding tube 24 French Part number 0110-24. #2 and 10 cc lurer lock syringe #2 Dx: 4 Device 2  . cetirizine HCl (ZYRTEC) 1 MG/ML solution Take 10 mLs (10 mg total) by mouth daily. 300 mL 0  . dantrolene (DANTRIUM) 50 MG capsule TAKE 1 CAPSULE BY MOUTH EVERY MORNING, 1 CAPSULE EVERY EVENING, AND 1 CAPSULE AT BEDTIME. 90 capsule 6  . DEPAKOTE SPRINKLES 125 MG capsule Take by mouth.    . diazepam (VALIUM) 5 MG tablet Take 5 mg by mouth 2 (two) times daily.    Marland Kitchen doxepin (SINEQUAN) 25 MG capsule Take 25 mg by mouth at bedtime.    Marland Kitchen doxycycline (VIBRA-TABS) 100 MG tablet Take 1 tablet (100 mg total) by mouth 2 (two) times daily. 14 tablet 0  . escitalopram (LEXAPRO) 5 MG/5ML solution Place 20 mLs into feeding tube daily.    . Incontinence Supply Disposable (PREVAIL BREEZERS MEDIUM) MISC pkg of 16- size medium 32" to 44"  Breathable cloth-like outer fabric (can't use the plastic outer surgace  Item # PVB-012/2 16 each 6  . ketotifen (ZADITOR) 0.035 % ophthalmic solution Place 1 drop into both eyes in the morning and at bedtime. 5 mL 0  . lamoTRIgine (LAMICTAL) 25 MG tablet Take 2 tablets (50 mg total) by mouth 2 (two) times daily. 120 tablet 6  . LORazepam (ATIVAN) 1 MG tablet Take 1 mg by mouth at bedtime.    . Misc. Devices (ALL-BODY MASSAGE) MISC 1 Units/hr  by Does not apply route as needed. Full body massage for Muscle spasticity due to Cerebral palsy 99 each 99  . mupirocin ointment (BACTROBAN) 2 % Apply to area thin film twice daily if needed 22 g 0  . NON FORMULARY Bard Leg Bag Extension tubing w/Connector 18", Sterile, latex-free  Item# [id number]    . NON FORMULARY Colorplast Freedom Cath Latex Self-Adhering Male External Catheter 31mm Diameter Intermediate  Item# [id number]    . NONFORMULARY OR COMPOUNDED ITEM [employer]ien REF [id number] - Kangaroo Joey Pump Set with Flush Bags - 1000 mL 1 each 0  . Nutritional Supplements (FEEDING SUPPLEMENT, KATE FARMS STANDARD 1.4,) LIQD liquid Take 325 mLs by mouth as directed. 3 carton over 16 hrs    . OLANZapine (ZYPREXA) 10 MG tablet Take 10 mg by mouth at bedtime.    Marland Kitchen OLANZapine (ZYPREXA) 5 MG tablet Take by mouth.    Maxwell Caul Bicarbonate (ZEGERID) 20-1100 MG CAPS capsule Take 1 capsule by mouth daily before breakfast.    . Ostomy Supplies (PROTECTIVE BARRIER WIPES) MISC 1-1/4" X 3"  Item #[id number] 75 each 6  . oxyBUTYnin (DITROPAN) 5 MG/5ML solution Take 5 mg by mouth 2 (two) times daily.    . sodium chloride HYPERTONIC 3 % nebulizer solution Take by nebulization in the morning and at bedtime. 750 mL 12  . sucralfate (CARAFATE) 1 g tablet Take 1 tablet (1 g total) by mouth 4 (four) times daily -  before meals and at bedtime. TAKE 1 TABLET BY MOUTH 4 TIMES DAILY WITH MEALS AND AT BEDTIME 120 tablet 1  . traZODone (DESYREL) 50 MG tablet Take 1 tablet (50 mg total) by mouth at bedtime.    Marland Kitchen levofloxacin (  LEVAQUIN) 750 MG tablet Crush 1 tablet daily for 7 days. 7 tablet 0  . lamoTRIgine 50 MG TBDP Take 1 tablet (50 mg total) by mouth 2 (two) times daily. 180 tablet 3   No facility-administered medications prior to visit.    Allergies  Allergen Reactions  . Ambien [Zolpidem Tartrate] Nausea Only  . Antihistamines, Chlorpheniramine-Type     Other reaction(s): Other (See Comments) Other Reaction:  agitation  . Augmentin [Amoxicillin-Pot Clavulanate] Diarrhea  . Cefdinir Diarrhea    Not tolerate well and cause diarrhea  . Codeine Other (See Comments)    Makes patient too active after a few days.  . Zolpidem     Other Reaction(s): GI Intolerance  . Baclofen Anxiety and Swelling    anxiety  . Metoclopramide Other (See Comments) and Swelling    Delusion, emotionality  Other Reaction(s): Mental Status Changes  . Pheniramine Rash    Other reaction(s): Other (See Comments)  Other Reaction: agitation  Other Reaction(s): Other (See Comments)  Other reaction(s): Other (See Comments), Other Reaction: agitation  . Sulfa Antibiotics Rash  . Sulfonamide Derivatives Rash    Review of Systems  Constitutional:  Negative for fever and malaise/fatigue.  HENT:  Positive for congestion.   Eyes:  Negative for blurred vision.  Respiratory:  Positive for cough and sputum production. Negative for shortness of breath.   Cardiovascular:  Negative for chest pain, palpitations and leg swelling.  Gastrointestinal:  Positive for abdominal pain. Negative for blood in stool and nausea.  Genitourinary:  Positive for dysuria. Negative for frequency.  Musculoskeletal:  Negative for falls.  Skin:  Negative for rash.  Neurological:  Negative for dizziness, loss of consciousness and headaches.  Endo/Heme/Allergies:  Negative for environmental allergies.  Psychiatric/Behavioral:  Negative for depression. The patient is not nervous/anxious.        Objective:    Physical Exam Vitals reviewed.  Constitutional:      Appearance: Normal appearance. He is not ill-appearing.  HENT:     Head: Normocephalic and atraumatic.     Nose: Nose normal.  Eyes:     Conjunctiva/sclera: Conjunctivae normal.  Cardiovascular:     Rate and Rhythm: Normal rate.     Pulses: Normal pulses.     Heart sounds: Normal heart sounds. No murmur heard. Pulmonary:     Effort: Pulmonary effort is normal.     Breath sounds:  Normal breath sounds. No wheezing.  Abdominal:     Palpations: Abdomen is soft. There is no mass.     Tenderness: There is no abdominal tenderness.     Comments: GTube LUQ Abdomen soft but tender to touch diffusely  Musculoskeletal:     Cervical back: Normal range of motion.     Right lower leg: No edema.     Left lower leg: No edema.  Skin:    General: Skin is warm and dry.  Neurological:     General: No focal deficit present.     Mental Status: He is alert and oriented to person, place, and time.  Psychiatric:        Mood and Affect: Mood normal.   BP 112/80 (BP Location: Left Arm, Patient Position: Sitting, Cuff Size: Normal)   Pulse 81   Temp 98 F (36.7 C) (Oral)   Resp 16   Ht 5\' 1"  (1.549 m)   Wt 89 lb (40.4 kg)   SpO2 95%   BMI 16.82 kg/m  Wt Readings from Last 3 Encounters:  08/03/23 89 lb (  40.4 kg)  06/29/23 89 lb (40.4 kg)  05/21/23 89 lb (40.4 kg)    Diabetic Foot Exam - Simple   No data filed    Lab Results  Component Value Date   WBC 6.4 04/26/2023   HGB 14.2 04/26/2023   HCT 43.2 04/26/2023   PLT 181.0 04/26/2023   GLUCOSE 80 04/26/2023   CHOL 140 04/26/2023   TRIG 61.0 04/26/2023   HDL 43.50 04/26/2023   LDLCALC 84 04/26/2023   ALT 12 04/26/2023   AST 15 04/26/2023   NA 136 04/26/2023   K 4.1 04/26/2023   CL 98 04/26/2023   CREATININE 0.26 (L) 04/26/2023   BUN 9 04/26/2023   CO2 29 04/26/2023   TSH 3.27 04/26/2023   HGBA1C 5.0 04/29/2007    Lab Results  Component Value Date   TSH 3.27 04/26/2023   Lab Results  Component Value Date   WBC 6.4 04/26/2023   HGB 14.2 04/26/2023   HCT 43.2 04/26/2023   MCV 95.8 04/26/2023   PLT 181.0 04/26/2023   Lab Results  Component Value Date   NA 136 04/26/2023   K 4.1 04/26/2023   CO2 29 04/26/2023   GLUCOSE 80 04/26/2023   BUN 9 04/26/2023   CREATININE 0.26 (L) 04/26/2023   BILITOT 0.3 04/26/2023   ALKPHOS 66 04/26/2023   AST 15 04/26/2023   ALT 12 04/26/2023   PROT 6.9 04/26/2023    ALBUMIN 4.4 04/26/2023   CALCIUM 9.4 04/26/2023   ANIONGAP 9 03/26/2023   GFR 156.97 04/26/2023   Lab Results  Component Value Date   CHOL 140 04/26/2023   Lab Results  Component Value Date   HDL 43.50 04/26/2023   Lab Results  Component Value Date   LDLCALC 84 04/26/2023   Lab Results  Component Value Date   TRIG 61.0 04/26/2023   Lab Results  Component Value Date   CHOLHDL 3 04/26/2023   Lab Results  Component Value Date   HGBA1C 5.0 04/29/2007       Assessment & Plan:  Hyperlipidemia, mild Assessment & Plan: Encourage heart healthy diet such as MIND or DASH diet, increase exercise, avoid trans fats, simple carbohydrates and processed foods, consider a krill or fish or flaxseed oil cap daily.    Infantile cerebral palsy (HCC) Assessment & Plan: Doing well at home cared for by his mother   Protein-calorie malnutrition, severe (HCC) Assessment & Plan: Continues NPO with gastrostomy tube feeds   Gastrostomy in place [facility]) Assessment & Plan: Tube managed well by his mother   Depression with anxiety Assessment & Plan: Doing well on current meds   Dysuria Assessment & Plan: And abdominal pain, will initiate Levaquin 500 mg daily x 7 days and check urine culture  Orders: -     Urinalysis -     Urine Culture  Insomnia, unspecified type Assessment & Plan: Has been more agitated and coughing that has kept him from sleeping at night, can use Hydromet to help prn   Recurrent aspiration pneumonia (HCC) Assessment & Plan: Not responding to Doxycycline will stop and then start Levaquin 500 mg po daily. If no improvement will need a CXR   Other orders -     levoFLOXacin; Take 1 tablet (500 mg total) by mouth daily for 7 days.  Dispense: 7 tablet; Refill: 0    Assessment and Plan    Respiratory Infection Persistent cough and chest congestion despite 5 days of Doxycycline. No improvement noted. Now reporting burning sensation and  increased  coughing. No fever or signs of dehydration. -Switch antibiotic to Levaquin 500mg , previously tolerated well in July. -Consider chest x-ray if no improvement with new antibiotic.  Cough Management Use of Mucinex , with Hydromet available if needed.   Follow-up No current appointments scheduled. -Schedule follow-up appointment for January. -Consider earlier or virtual appointment if needed.         Danise Edge, MD

## 2023-08-03 NOTE — Assessment & Plan Note (Signed)
And abdominal pain, will initiate Levaquin 500 mg daily x 7 days and check urine culture

## 2023-08-04 LAB — URINALYSIS
Bilirubin Urine: NEGATIVE
Hgb urine dipstick: NEGATIVE
Ketones, ur: NEGATIVE
Leukocytes,Ua: NEGATIVE
Nitrite: NEGATIVE
Specific Gravity, Urine: 1.01 (ref 1.000–1.030)
Total Protein, Urine: NEGATIVE
Urine Glucose: NEGATIVE
Urobilinogen, UA: 0.2 (ref 0.0–1.0)
pH: 7.5 (ref 5.0–8.0)

## 2023-08-04 LAB — URINE CULTURE
MICRO NUMBER:: 15536434
SPECIMEN QUALITY:: ADEQUATE

## 2023-08-05 ENCOUNTER — Telehealth: Payer: Self-pay | Admitting: Family Medicine

## 2023-08-05 ENCOUNTER — Telehealth: Payer: Self-pay

## 2023-08-05 ENCOUNTER — Other Ambulatory Visit: Payer: Self-pay | Admitting: Family

## 2023-08-05 ENCOUNTER — Ambulatory Visit (HOSPITAL_BASED_OUTPATIENT_CLINIC_OR_DEPARTMENT_OTHER)
Admission: RE | Admit: 2023-08-05 | Discharge: 2023-08-05 | Disposition: A | Discharge status: Home / Self Care | Payer: Medicare Other | Source: Ambulatory Visit | Attending: Family Medicine | Admitting: Family Medicine

## 2023-08-05 DIAGNOSIS — J90 Pleural effusion, not elsewhere classified: Secondary | ICD-10-CM | POA: Diagnosis not present

## 2023-08-05 DIAGNOSIS — R051 Acute cough: Secondary | ICD-10-CM | POA: Insufficient documentation

## 2023-08-05 DIAGNOSIS — J9811 Atelectasis: Secondary | ICD-10-CM | POA: Diagnosis not present

## 2023-08-05 DIAGNOSIS — R059 Cough, unspecified: Secondary | ICD-10-CM | POA: Diagnosis not present

## 2023-08-05 DIAGNOSIS — R918 Other nonspecific abnormal finding of lung field: Secondary | ICD-10-CM | POA: Diagnosis not present

## 2023-08-05 MED ORDER — METHYLPREDNISOLONE 4 MG PO TBPK: ORAL_TABLET | ORAL | 0 refills | Status: DC

## 2023-08-05 NOTE — Telephone Encounter (Signed)
Message sent to pt mother.

## 2023-08-05 NOTE — Telephone Encounter (Signed)
Done

## 2023-08-05 NOTE — Telephone Encounter (Signed)
Chest X-Ray was ordered per Dr.Blyth ok with   It and pt mother was advised.  Mother stated understand  Was advised also if pt symptoms worse will need to go  ER. Pt mother ask for prednisone.

## 2023-08-05 NOTE — Telephone Encounter (Signed)
Pt wanted to let Dr. Abner Greenspan know that her son says he is feeling worse and his chest is worse. He started running a fever of 100. This is the first time he has had a fever. Pt is requesting a chest xray. Ms. Seydel said that they are traveling next Monday and will be gone the whole week so they are trying to address this before they leave. Please call to advise.

## 2023-08-06 ENCOUNTER — Emergency Department (HOSPITAL_BASED_OUTPATIENT_CLINIC_OR_DEPARTMENT_OTHER): Payer: Medicare Other

## 2023-08-06 ENCOUNTER — Emergency Department (HOSPITAL_BASED_OUTPATIENT_CLINIC_OR_DEPARTMENT_OTHER)
Admission: EM | Admit: 2023-08-06 | Discharge: 2023-08-06 | Discharge status: Against Medical Advice | Payer: Medicare Other | Attending: Emergency Medicine | Admitting: Emergency Medicine

## 2023-08-06 ENCOUNTER — Other Ambulatory Visit: Payer: Self-pay

## 2023-08-06 ENCOUNTER — Encounter (HOSPITAL_BASED_OUTPATIENT_CLINIC_OR_DEPARTMENT_OTHER): Payer: Self-pay | Admitting: Emergency Medicine

## 2023-08-06 DIAGNOSIS — R052 Subacute cough: Secondary | ICD-10-CM | POA: Insufficient documentation

## 2023-08-06 DIAGNOSIS — R000 Tachycardia, unspecified: Secondary | ICD-10-CM | POA: Insufficient documentation

## 2023-08-06 DIAGNOSIS — R451 Restlessness and agitation: Secondary | ICD-10-CM | POA: Insufficient documentation

## 2023-08-06 DIAGNOSIS — R509 Fever, unspecified: Secondary | ICD-10-CM | POA: Diagnosis not present

## 2023-08-06 DIAGNOSIS — R079 Chest pain, unspecified: Secondary | ICD-10-CM | POA: Insufficient documentation

## 2023-08-06 DIAGNOSIS — J9811 Atelectasis: Secondary | ICD-10-CM | POA: Diagnosis not present

## 2023-08-06 DIAGNOSIS — J90 Pleural effusion, not elsewhere classified: Secondary | ICD-10-CM | POA: Diagnosis not present

## 2023-08-06 DIAGNOSIS — Z20822 Contact with and (suspected) exposure to covid-19: Secondary | ICD-10-CM | POA: Insufficient documentation

## 2023-08-06 DIAGNOSIS — R062 Wheezing: Secondary | ICD-10-CM | POA: Diagnosis not present

## 2023-08-06 DIAGNOSIS — R0789 Other chest pain: Secondary | ICD-10-CM | POA: Diagnosis not present

## 2023-08-06 DIAGNOSIS — R059 Cough, unspecified: Secondary | ICD-10-CM | POA: Diagnosis not present

## 2023-08-06 LAB — URINALYSIS, ROUTINE W REFLEX MICROSCOPIC
Bilirubin Urine: NEGATIVE
Glucose, UA: NEGATIVE mg/dL
Hgb urine dipstick: NEGATIVE
Ketones, ur: NEGATIVE mg/dL
Leukocytes,Ua: NEGATIVE
Nitrite: NEGATIVE
Protein, ur: NEGATIVE mg/dL
Specific Gravity, Urine: 1.015 (ref 1.005–1.030)
pH: 7.5 (ref 5.0–8.0)

## 2023-08-06 LAB — CBC
HCT: 41.6 % (ref 39.0–52.0)
Hemoglobin: 14.3 g/dL (ref 13.0–17.0)
MCH: 31.9 pg (ref 26.0–34.0)
MCHC: 34.4 g/dL (ref 30.0–36.0)
MCV: 92.9 fL (ref 80.0–100.0)
Platelets: 175 10*3/uL (ref 150–400)
RBC: 4.48 MIL/uL (ref 4.22–5.81)
RDW: 12.4 % (ref 11.5–15.5)
WBC: 4.6 10*3/uL (ref 4.0–10.5)
nRBC: 0 % (ref 0.0–0.2)

## 2023-08-06 LAB — TROPONIN I (HIGH SENSITIVITY)
Troponin I (High Sensitivity): <2 ng/L (ref <18)
Troponin I (High Sensitivity): 3 ng/L (ref <18)

## 2023-08-06 LAB — BASIC METABOLIC PANEL
Anion gap: 9 (ref 5–15)
BUN: 9 mg/dL (ref 6–20)
CO2: 29 mmol/L (ref 22–32)
Calcium: 9.1 mg/dL (ref 8.9–10.3)
Chloride: 98 mmol/L (ref 98–111)
Creatinine, Ser: 0.31 mg/dL — ABNORMAL LOW (ref 0.61–1.24)
GFR, Estimated: >60 mL/min (ref >60)
Glucose, Bld: 102 mg/dL — ABNORMAL HIGH (ref 70–99)
Potassium: 4.1 mmol/L (ref 3.5–5.1)
Sodium: 136 mmol/L (ref 135–145)

## 2023-08-06 LAB — LACTIC ACID, PLASMA: Lactic Acid, Venous: 1 mmol/L (ref 0.5–1.9)

## 2023-08-06 LAB — RESP PANEL BY RT-PCR (RSV, FLU A&B, COVID)  RVPGX2
Influenza A by PCR: NEGATIVE
Influenza B by PCR: NEGATIVE
Resp Syncytial Virus by PCR: NEGATIVE
SARS Coronavirus 2 by RT PCR: NEGATIVE

## 2023-08-06 MED ORDER — IOHEXOL 300 MG/ML  SOLN
75.0000 mL | Freq: Once | INTRAMUSCULAR | Status: AC | PRN
  Administered 2023-08-06: 75 mL via INTRAVENOUS

## 2023-08-06 NOTE — ED Provider Notes (Signed)
[place] [facility] AT [facility] Provider Note   CSN: [id number] Arrival date & time: 08/06/23  1545     History  Chief Complaint  Patient presents with   Chest Pain    [name] is a 37 y.o. male.  37 y.o. male presenting for tachycardia and cough.  Mom at bedside able to provide history.  She reports he has been worked up outpatient for UTI and pneumonia and was started on Levaquin 4 days ago.  On initial interview she reports he was clinically improving however last night he became tachycardic to the 130s and had a fever to 100.4 at home.  Presented today due to him continuing to be agitated and having a cough.  He has been tolerating his tube feeds well and has had normal urine output and bowel movements.  They have been suctioning intermittently with respiratory PT at home and report his secretions have improved since starting the antibiotic.  Mom is concerned due to his increased agitation and called his neuropsychologist she reports that his behavior has been difficult to manage at home.  They denies signs of respiratory distress.  They deny sick contacts however he has 3 health aides that assist him at home.  At baseline he is minimally verbal, has a G-tube which he receives tube feeds and medications, has external urinary catheter in place.  The history is provided by a parent. The history is limited by a developmental delay. No language interpreter was used.  Chest Pain Associated symptoms: cough and fever   Associated symptoms: no abdominal pain, no nausea, no shortness of breath, no vomiting and no weakness        Home Medications Prior to Admission medications   Medication Sig Start Date End Date Taking? Authorizing Provider  albuterol (PROVENTIL) (5 MG/ML) 0.5% nebulizer solution Take 0.5 mLs (2.5 mg total) by nebulization every 6 (six) hours as needed for wheezing or shortness of breath. 03/26/23   Horton, Clabe Seal, DO  AMBULATORY NON  FORMULARY MEDICATION Medication Name: MIC gastrostomy/bolus feeding tube 24 French Part number 0110-24. #2 and 10 cc lurer lock syringe #2 Dx: 10/15/15   Pyrtle, Carie Caddy, MD  cetirizine HCl (ZYRTEC) 1 MG/ML solution Take 10 mLs (10 mg total) by mouth daily. 12/20/22   Wallis Bamberg, PA-C  dantrolene (DANTRIUM) 50 MG capsule TAKE 1 CAPSULE BY MOUTH EVERY MORNING, 1 CAPSULE EVERY EVENING, AND 1 CAPSULE AT BEDTIME. 07/01/23   Ranelle Oyster, MD  DEPAKOTE SPRINKLES 125 MG capsule Take by mouth. 06/13/21   [provider]  diazepam (VALIUM) 5 MG tablet Take 5 mg by mouth 2 (two) times daily. 01/30/22   [provider]  doxepin (SINEQUAN) 25 MG capsule Take 25 mg by mouth at bedtime. 04/24/23   [provider]  doxycycline (VIBRA-TABS) 100 MG tablet Take 1 tablet (100 mg total) by mouth 2 (two) times daily. 07/29/23   Alfredia Ferguson, PA-C  escitalopram (LEXAPRO) 5 MG/5ML solution Place 20 mLs into feeding tube daily. 03/24/23   [provider]  Incontinence Supply Disposable (PREVAIL BREEZERS MEDIUM) MISC pkg of 16- size medium 32" to 44"  Breathable cloth-like outer fabric (can't use the plastic outer surgace  Item # [id number]/[id number] 07/30/14   Bradd Canary, MD  ketotifen (ZADITOR) 0.035 % ophthalmic solution Place 1 drop into both eyes in the morning and at bedtime. 06/18/23   Alfredia Ferguson, PA-C  lamoTRIgine (LAMICTAL) 25 MG tablet Take 2 tablets (50 mg total)  by mouth 2 (two) times daily. 04/12/23   Levert Feinstein, MD  levofloxacin (LEVAQUIN) 500 MG tablet Take 1 tablet (500 mg total) by mouth daily for 7 days. 08/03/23 08/10/23  Bradd Canary, MD  LORazepam (ATIVAN) 1 MG tablet Take 1 mg by mouth at bedtime. 02/24/23   [provider]  methylPREDNISolone (MEDROL DOSEPAK) 4 MG TBPK tablet As directed 08/05/23   Eulis Foster, FNP  Misc. Devices (ALL-BODY MASSAGE) MISC 1 Units/hr by Does not apply route as needed. 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 03/14/19   Bradd Canary, MD  mupirocin ointment Idelle Jo) 2 % Apply to area thin film twice daily if needed 09/02/16   Saguier, Ramon Dredge, PA-C  NON FORMULARY Bard Leg Bag Extension tubing w/Connector 18", Sterile, latex-free  Item# [id number]    [provider]  NON FORMULARY Colorplast Freedom Cath Latex Self-Adhering Male External Catheter 31mm Diameter Intermediate  Item# [id number]    [provider]  NONFORMULARY OR COMPOUNDED Audelia Hives REF [id number] - Kangaroo Joey Pump Set with Flush Bags - 1000 mL 06/04/15   Bradd Canary, MD  Nutritional Supplements (FEEDING SUPPLEMENT, KATE FARMS STANDARD 1.4,) LIQD liquid Take 325 mLs by mouth as directed. 3 carton over 16 hrs    [provider]  OLANZapine (ZYPREXA) 10 MG tablet Take 10 mg by mouth at bedtime. 06/11/22   [provider]  OLANZapine (ZYPREXA) 5 MG tablet Take by mouth. 02/12/21   [provider]  Omeprazole-Sodium Bicarbonate (ZEGERID) 20-1100 MG CAPS capsule Take 1 capsule by mouth daily before breakfast.    [provider]  Ostomy Supplies (PROTECTIVE BARRIER WIPES) MISC 1-1/4" X 3"  Item #[id number] 07/30/14   Bradd Canary, MD  oxyBUTYnin (DITROPAN) 5 MG/5ML solution Take 5 mg by mouth 2 (two) times daily. 03/31/23   [provider]  sodium chloride HYPERTONIC 3 % nebulizer solution Take by nebulization in the morning and at bedtime. 04/14/22   Mannam, Colbert Coyer, MD  sucralfate (CARAFATE) 1 g tablet Take 1 tablet (1 g total) by mouth 4 (four) times daily -  before meals and at bedtime. TAKE 1 TABLET BY MOUTH 4 TIMES DAILY WITH MEALS AND AT BEDTIME 07/16/23   Pyrtle, Carie Caddy, MD  traZODone (DESYREL) 50 MG tablet Take 1 tablet (50 mg total) by mouth at bedtime. 04/26/23   Bradd Canary, MD      Allergies    Ambien [zolpidem tartrate]; Antihistamines, chlorpheniramine-type; Augmentin [amoxicillin-pot clavulanate]; Cefdinir; Codeine; Zolpidem; Baclofen; Metoclopramide;  Pheniramine; Sulfa antibiotics; and Sulfonamide derivatives    Review of Systems   Review of Systems  Constitutional:  Positive for chills and fever. Negative for activity change and appetite change.  HENT:  Positive for congestion.   Respiratory:  Positive for cough. Negative for shortness of breath, wheezing and stridor.   Cardiovascular:  Positive for chest pain.  Gastrointestinal:  Negative for abdominal pain, constipation, diarrhea, nausea and vomiting.  Genitourinary:  Negative for difficulty urinating and frequency.  Musculoskeletal:  Negative for arthralgias.  Skin:  Negative for color change.  Neurological:  Negative for seizures, syncope and weakness.  Psychiatric/Behavioral:  Positive for agitation.     Physical Exam Updated Vital Signs BP (!) 122/97   Pulse (!) 110   Temp 97.7 F (36.5 C) (Axillary)   Resp (!) 21   SpO2 98%  Physical Exam Constitutional:      General: He is not in acute distress.  Appearance: He is ill-appearing.  HENT:     Head: Normocephalic.  Eyes:     Pupils: Pupils are equal, round, and reactive to light.  Cardiovascular:     Rate and Rhythm: Regular rhythm. Tachycardia present.     Heart sounds: No murmur heard. Pulmonary:     Effort: Pulmonary effort is normal.     Breath sounds: Normal breath sounds. No decreased breath sounds, wheezing or rhonchi.  Abdominal:     General: Bowel sounds are normal.     Palpations: Abdomen is soft.     Comments: G-tube present and site clean in good condition without signs of infection  Musculoskeletal:     Comments: Bilateral upper extremity contractures  Skin:    General: Skin is warm.     Capillary Refill: Capillary refill takes less than 2 seconds.     Coloration: Skin is not pale.     Findings: No erythema.  Neurological:     Mental Status: He is alert.     Comments: At neurological baseline     ED Results / Procedures / Treatments   Labs (all labs ordered are listed, but only abnormal  results are displayed) Labs Reviewed  BASIC METABOLIC PANEL - Abnormal; Notable for the following components:      Result Value   Glucose, Bld 102 (*)    Creatinine, Ser 0.31 (*)    All other components within normal limits  RESP PANEL BY RT-PCR (RSV, FLU A&B, COVID)  RVPGX2  CBC  URINALYSIS, ROUTINE W REFLEX MICROSCOPIC  LACTIC ACID, PLASMA  LACTIC ACID, PLASMA  TROPONIN I (HIGH SENSITIVITY)  TROPONIN I (HIGH SENSITIVITY)    EKG EKG Interpretation Date/Time:  Friday August 06 2023 16:02:05 EDT Ventricular Rate:  100 PR Interval:  146 QRS Duration:  79 QT Interval:  315 QTC Calculation: 407 R Axis:   83  Text Interpretation: Sinus tachycardia ST elev, probable normal early repol pattern when comapred to prior, more artifact. No STEMI Confirmed by Theda Belfast ([id number]) on 08/06/2023 5:04:00 PM  Radiology CT Chest W Contrast  Result Date: 08/06/2023 CLINICAL DATA:  Seen last week for upper respiratory symptoms and was placed on antibiotics. Increased congestion and wheezing and chest since yesterday. Decreased O2 saturations. Tachycardic. EXAM: CT CHEST WITH CONTRAST TECHNIQUE: Multidetector CT imaging of the chest was performed during intravenous contrast administration. RADIATION DOSE REDUCTION: This exam was performed according to the departmental dose-optimization program which includes automated exposure control, adjustment of the mA and/or kV according to patient size and/or use of iterative reconstruction technique. CONTRAST:  75mL OMNIPAQUE IOHEXOL 300 MG/ML  SOLN COMPARISON:  Chest radiograph 08/05/2023 and CT chest 01/16/2015 FINDINGS: Cardiovascular: Normal heart size.  No pericardial effusion. Mediastinum/Nodes: Frothy debris along the right wall of the trachea. Unremarkable esophagus. No thoracic adenopathy. Lungs/Pleura: The left lung is clear. Round atelectasis in the posterior right lower lobe is similar to 2016. Small right pleural effusion. No pneumothorax. Upper  Abdomen: No acute abnormality. Musculoskeletal: Spinal fusion hardware.  No acute fracture. IMPRESSION: 1. Frothy debris along the right wall of the trachea, query aspiration. 2. Round atelectasis in the posterior right lower lobe and small right pleural effusion are similar to 2016. Electronically Signed   By: Minerva Fester M.D.   On: 08/06/2023 21:39   DG Chest 2 View  Result Date: 08/06/2023 CLINICAL DATA:  Cough.  Respiratory discomfort and fever. EXAM: CHEST - 2 VIEW COMPARISON:  03/26/2023, CT 01/16/2015 FINDINGS: The heart is normal in size.  Stable mediastinal contours. Chronic hazy right infrahilar opacity and small right pleural effusion. Minimal retrocardiac atelectasis at the left lung base. No new airspace disease. No pulmonary edema or pneumothorax. Catheter in the posterior right subcutaneous soft tissues. Extensive cervicothoracic fusion hardware. IMPRESSION: Chronic hazy right infrahilar opacity and small right pleural effusion. Minimal retrocardiac atelectasis at the left lung base. No new airspace disease. Electronically Signed   By: Narda Rutherford M.D.   On: 08/06/2023 16:48    Procedures Procedures    Medications Ordered in ED Medications  iohexol (OMNIPAQUE) 300 MG/ML solution 75 mL (75 mLs Intravenous Contrast Given 08/06/23 1907)    ED Course/ Medical Decision Making/ A&P                                 Medical Decision Making 37 y.o. male presenting with worsening tachycardia and cough that has been present for >1 week.  On presentation vital signs significant for fever to 100.8 and tachycardia to 110s.  On exam lungs sound coarse and he is agitated.  Differential for this presentation includes pneumonia, sepsis, UTI, viral infection.  Shared decision making with mother to pursue further workup with lactic acid, CT chest with contrast, viral panel testing.  CBC and BMP within normal limits.  ACS workup with normal troponin x 2 and EKG similar to prior.  Lactic acid  within normal limits, respiratory panel resulted negative for COVID, flu, RSV.  While awaiting imaging results mom decided to take patient home.  She understands that she can be called back if CT scan shows worsening pneumonia for possible admission.  Mother is aware and would like to receive results via MyChart.   CT chest with contrast showing debris in the right while the trachea questionable for aspiration with round atelectasis in the right posterior lobe and small right pleural effusion that is similar to prior CT in 2016.  Low suspicion for sepsis or need for admission for IV antibiotics.  Patient instructed to follow-up with PCP outpatient to continue course of antibiotics prior to leaving AMA.  Amount and/or Complexity of Data Reviewed Labs: ordered. Radiology: ordered.  Risk Prescription drug management.          Final Clinical Impression(s) / ED Diagnoses Final diagnoses:  Subacute cough  Tachycardia    Rx / DC Orders ED Discharge Orders     None         Glendale Chard, DO 08/06/23 2155    Tegeler, Canary Brim, MD 08/07/23 1517

## 2023-08-06 NOTE — ED Notes (Signed)
Patients mother requesting to leave before CT results. MD informed and states she will go talk to family with update.

## 2023-08-06 NOTE — ED Notes (Signed)
[name] requested IV to be taken out and to take patient home. Risk of leaving explained to patient and mother (legal guardian). MD at bedside. AMA form signed. Patient left with mother.

## 2023-08-06 NOTE — ED Triage Notes (Addendum)
Was seen last week for URI symptoms and was placed on anbx.  Had follow up with PCP on Tuesday and anbx were changed to Levaquin and suspected a UTI.  Yesterday morning he had more congestion and wheezing in his chest per family.  Started on prednisone and was told to come to the ED if things worsened.  Mom reports increased HR last night and O2 at 93-94%.   Pt letting mom know that his chest hurts and is still tachycardic per mom.  Mom did bring in a clean urine specimen she states was obtained directly and not from the patient's catheter.

## 2023-08-07 ENCOUNTER — Other Ambulatory Visit: Payer: Self-pay | Admitting: Neurology

## 2023-08-08 ENCOUNTER — Encounter: Payer: Self-pay | Admitting: Family Medicine

## 2023-08-09 ENCOUNTER — Other Ambulatory Visit: Payer: Self-pay | Admitting: Family Medicine

## 2023-08-09 DIAGNOSIS — G4700 Insomnia, unspecified: Secondary | ICD-10-CM | POA: Diagnosis not present

## 2023-08-09 DIAGNOSIS — F339 Major depressive disorder, recurrent, unspecified: Secondary | ICD-10-CM | POA: Diagnosis not present

## 2023-08-09 MED ORDER — CEFUROXIME AXETIL 500 MG PO TABS: 500.0000 mg | ORAL_TABLET | Freq: Two times a day (BID) | ORAL | 0 refills | Status: DC

## 2023-08-09 MED ORDER — LEVOFLOXACIN 500 MG PO TABS: 500.0000 mg | ORAL_TABLET | Freq: Every day | ORAL | 0 refills | Status: AC

## 2023-08-09 MED ORDER — ATENOLOL 25 MG PO TABS: 25.0000 mg | ORAL_TABLET | Freq: Every day | ORAL | 1 refills | Status: DC

## 2023-08-09 NOTE — Telephone Encounter (Signed)
[name] called to follow up on any available info regarding this matter. Advised her that this had been routed back to Dr. Abner Greenspan for review and she should be taking a look into this shortly.

## 2023-08-10 NOTE — Addendum Note (Signed)
Encounter addended by: Arby Barrette on: 08/10/2023 3:30 PM  Actions taken: Imaging Exam ended

## 2023-09-13 ENCOUNTER — Encounter: Payer: Self-pay | Admitting: Family Medicine

## 2023-09-13 ENCOUNTER — Ambulatory Visit (HOSPITAL_BASED_OUTPATIENT_CLINIC_OR_DEPARTMENT_OTHER)
Admission: RE | Admit: 2023-09-13 | Discharge: 2023-09-13 | Disposition: A | Discharge status: Home / Self Care | Payer: Medicare Other | Source: Ambulatory Visit | Attending: Family Medicine | Admitting: Family Medicine

## 2023-09-13 ENCOUNTER — Ambulatory Visit (INDEPENDENT_AMBULATORY_CARE_PROVIDER_SITE_OTHER): Payer: Medicare Other | Admitting: Family Medicine

## 2023-09-13 VITALS — BP 93/55 | HR 96 | Temp 97.2°F

## 2023-09-13 DIAGNOSIS — J984 Other disorders of lung: Secondary | ICD-10-CM | POA: Diagnosis not present

## 2023-09-13 DIAGNOSIS — J189 Pneumonia, unspecified organism: Secondary | ICD-10-CM | POA: Diagnosis not present

## 2023-09-13 DIAGNOSIS — R051 Acute cough: Secondary | ICD-10-CM | POA: Insufficient documentation

## 2023-09-13 DIAGNOSIS — R058 Other specified cough: Secondary | ICD-10-CM | POA: Diagnosis not present

## 2023-09-13 DIAGNOSIS — R918 Other nonspecific abnormal finding of lung field: Secondary | ICD-10-CM | POA: Diagnosis not present

## 2023-09-13 MED ORDER — CEFUROXIME AXETIL 500 MG PO TABS
500.0000 mg | ORAL_TABLET | Freq: Two times a day (BID) | ORAL | 0 refills | Status: DC
Start: 2023-09-13 — End: 2023-09-14

## 2023-09-13 MED ORDER — AZITHROMYCIN 200 MG/5ML PO SUSR
250.0000 mg | Freq: Every day | ORAL | 0 refills | Status: DC
Start: 2023-09-13 — End: 2024-05-25

## 2023-09-13 MED ORDER — PREDNISONE 10 MG PO TABS: ORAL_TABLET | ORAL | 0 refills | Status: DC

## 2023-09-13 NOTE — Patient Instructions (Signed)
Possibly viral infection. Stable on exam today Chest xray today, right side diminished Continue airway vest, nebulizer treatments, Mucinex/Robitussin hydration, etc Short prednisone taper Will follow-up pending xray results Please schedule an appointment to get back in with pulmonology given frequency of respiratory infections Notify us for any new or worsening symptoms - ED for any severe symptoms.

## 2023-09-13 NOTE — Progress Notes (Signed)
Acute Office Visit  Subjective:     Patient ID: [name], male    DOB: 10-01-1986, 37 y.o.   MRN: [id number]  Chief Complaint  Patient presents with   Cough    Cough   Patient is in today for cough. Brought in by mother.   Discussed the use of AI scribe software for clinical note transcription with the patient, who gave verbal consent to proceed.  History of Present Illness   The patient, with a history of chronic respiratory issues, presents with a recurrent cough and increased sputum production that began five days ago. The cough is persistent and has been disturbing his sleep, leading to the use of lorazepam for relief. The patient denies any associated fever or blood in the sputum. The sputum is occasionally discolored, but not consistently. The patient has been using Mucinex DM for symptom management, but it is suspected to be causing insomnia. The patient has been using an airway clearance vest and has not been experiencing any wheezing. The patient's oxygen saturation level remains at 99%. The patient has not been on prednisone recently and has not been seen by pulmonology for over a year. The patient's blood pressure was noted to be 93/55, and he is not currently on any antihypertensive medication.            All review of systems negative except what is listed in the HPI      Objective:    BP (!) 93/55   Pulse 96   Temp (!) 97.2 F (36.2 C) (Oral)   SpO2 99%    Physical Exam Vitals reviewed.  Constitutional:      Appearance: Normal appearance.  Cardiovascular:     Rate and Rhythm: Normal rate.  Pulmonary:     Effort: Pulmonary effort is normal. No respiratory distress.     Breath sounds: No stridor. Examination of the right-middle field reveals decreased breath sounds. Examination of the right-lower field reveals decreased breath sounds. Decreased breath sounds present. No wheezing, rhonchi or rales.     Comments: No coughing observed during  appointment  Musculoskeletal:     Cervical back: Normal range of motion and neck supple.  Neurological:     General: No focal deficit present.     Mental Status: He is alert and oriented to person, place, and time. Mental status is at baseline.  Psychiatric:        Mood and Affect: Mood normal.        Behavior: Behavior normal.        Thought Content: Thought content normal.        Judgment: Judgment normal.     No results found for any visits on 09/13/23.      Assessment & Plan:   Problem List Items Addressed This Visit   None Visit Diagnoses     Acute cough    -  Primary   Relevant Medications   predniSONE (DELTASONE) 10 MG tablet   Other Relevant Orders   DG Chest 2 View      Frequent coughing episodes with increased sputum production, occurring every 5-6 weeks. No fever or discolored sputum. Diminished breath sounds on the right side. No recent contact with sick individuals. -Order chest x-ray to compare with previous imaging. -Start Prednisone taper as this has worked well in the past -Continue Mucinex, consider switching to plain Robitussin to avoid potential sleep disturbances. -Continue airway clearance vest and add breathing treatments. -Refer back to Pulmonology for preventative  treatment plan. Patient's mom to call their office to schedule.        Meds ordered this encounter  Medications   predniSONE (DELTASONE) 10 MG tablet    Sig: 3 po qd for 3 days then 2 po qd for 3 days the 1 po qd for 3 days    Dispense:  18 tablet    Refill:  0    Order Specific Question:   Supervising Provider    Answer:   Danise Edge A [[id number]]    Return if symptoms worsen or fail to improve.  Clayborne Dana, NP

## 2023-09-13 NOTE — Addendum Note (Signed)
Addended by: Hyman Hopes B on: 09/13/2023 05:19 PM   Modules accepted: Orders

## 2023-09-13 NOTE — Progress Notes (Signed)
Spoke to mom Given that he is high risk and has had recurrent PNA, treating with azithromycin and Ceftin (mom aware that same class is Omnicef which gives him diarrhea - she will monitor him closely). If not improving, he will likely need to go to the hospital for IV ABX.  Reminded mom to reestablish with pulmonology given frequent respiratory issues/infections.

## 2023-09-14 ENCOUNTER — Telehealth: Payer: Self-pay | Admitting: Family Medicine

## 2023-09-14 DIAGNOSIS — J189 Pneumonia, unspecified organism: Secondary | ICD-10-CM

## 2023-09-14 MED ORDER — CEFPODOXIME PROXETIL 100 MG/5ML PO SUSR
100.0000 mg | Freq: Three times a day (TID) | ORAL | 0 refills | Status: DC
Start: 2023-09-14 — End: 2023-11-25

## 2023-09-14 NOTE — Telephone Encounter (Signed)
Talked to Dr. Abner Greenspan. We are going to try liquid Vantin (same class as the Ceftin, Omnicef, etc). If he cannot tolerate or is not improving, then he needs to go to the hospital for IV antibiotics.

## 2023-09-14 NOTE — Telephone Encounter (Signed)
Bonita Quin called stating that the medication that was sent in is not covered by insurance and they would have to order it from a specialty pharmacy. Bonita Quin wanted to see about doing the Cephalexin instead.

## 2023-09-14 NOTE — Telephone Encounter (Signed)
Mom said to disregard previous message. She will go ahead and pay out of pocket because she does not want him to go to the hospital.

## 2023-09-14 NOTE — Addendum Note (Signed)
Addended by: Hyman Hopes B on: 09/14/2023 10:30 AM   Modules accepted: Orders

## 2023-09-14 NOTE — Telephone Encounter (Signed)
Please advise 

## 2023-09-14 NOTE — Telephone Encounter (Signed)
Spoke with patient's mother and advised of recommendations.

## 2023-09-14 NOTE — Telephone Encounter (Signed)
Bonita Quin (mother [facility]) called stating that the cefUROXime (CEFTIN) 500 MG tablet [[id number]] is unable to be crushed and cannot be administered by feeding tube. Bonita Quin also stated that Cefdinir causes a reaction in pt and he cannot take that but pt has been fine taking Cephalexin 250mg  suspension with no GI issues if Ladona Ridgel wants to change it to that.

## 2023-09-15 ENCOUNTER — Telehealth: Payer: Self-pay

## 2023-09-15 NOTE — Telephone Encounter (Signed)
PA initiated via Covermymeds; KEY: [id number]. Awaiting determination.

## 2023-09-15 NOTE — Telephone Encounter (Signed)
PA approved.   Approved. This drug has been approved under the Member's Medicare Part D benefit for CEFPODOXIME PROXETIL For Suspension 100MG /5ML. Approved quantity: 150 per 30 day(s). You may fill up to a 90 day supply except for those on Specialty Tier 5, which can be filled up to a 30 day supply. Please call the pharmacy to process the prescription claim. Authorization Expiration Date: 11/01/2098

## 2023-09-17 DIAGNOSIS — G4700 Insomnia, unspecified: Secondary | ICD-10-CM | POA: Diagnosis not present

## 2023-09-17 DIAGNOSIS — F339 Major depressive disorder, recurrent, unspecified: Secondary | ICD-10-CM | POA: Diagnosis not present

## 2023-09-22 ENCOUNTER — Encounter: Payer: Self-pay | Admitting: Family Medicine

## 2023-09-22 ENCOUNTER — Encounter: Payer: Self-pay | Admitting: Physical Medicine & Rehabilitation

## 2023-09-22 ENCOUNTER — Encounter: Payer: Medicare Other | Attending: Physical Medicine & Rehabilitation | Admitting: Physical Medicine & Rehabilitation

## 2023-09-22 VITALS — BP 101/71 | HR 84 | Ht 61.0 in

## 2023-09-22 DIAGNOSIS — G800 Spastic quadriplegic cerebral palsy: Secondary | ICD-10-CM | POA: Diagnosis not present

## 2023-09-22 MED ORDER — ONABOTULINUMTOXINA 100 UNITS IJ SOLR
600.0000 [IU] | Freq: Once | INTRAMUSCULAR | Status: AC
Start: 2023-09-22 — End: 2023-09-22
  Administered 2023-09-22: 600 [IU] via INTRAMUSCULAR

## 2023-09-22 NOTE — Patient Instructions (Signed)
ALWAYS FEEL FREE TO CALL OUR OFFICE WITH ANY PROBLEMS OR QUESTIONS ([phone])  **PLEASE NOTE** ALL MEDICATION REFILL REQUESTS (INCLUDING CONTROLLED SUBSTANCES) NEED TO BE MADE AT LEAST 7 DAYS PRIOR TO REFILL BEING DUE. ANY REFILL REQUESTS INSIDE THAT TIME FRAME MAY RESULT IN DELAYS IN RECEIVING YOUR PRESCRIPTION.                    

## 2023-09-22 NOTE — Progress Notes (Signed)
Botox Injection for spasticity using needle EMG guidance Indication: Spastic quadriplegic cerebral palsy (HCC) - Plan: botulinum toxin Type A (BOTOX) injection 600 Units G80.0  Dilution: 100 Units/ml        Total Units Injected: 600 Indication: Severe spasticity which interferes with ADL,mobility and/or  hygiene and is unresponsive to medication management and other conservative care Informed consent was obtained after describing risks and benefits of the procedure with the patient. This includes bleeding, bruising, infection, excessive weakness, or medication side effects. A REMS form is on file and signed.   Needle: 50mm injectable monopolar needle electrode  Number of units per muscle Pectoralis Major 0 units Pectoralis Minor 0 units Biceps 0 units Brachioradialis 0 units FCR 0 units FCU 0 units FDS 0 units FDP 0 units FPL 0 units Pronator Teres 0 units Pronator Quadratus 0 units Lumbricals 0 units Quadriceps 100 units right and 100 units left Gastroc/soleus 0 units Hamstrings 200 units right and 200 units left.  Tibialis Posterior 0 units Tibialis Anterior 0 units EHL 0 units All injections were done after obtaining appropriate EMG activity and after negative drawback for blood. The patient tolerated the procedure well. Post procedure instructions were given. Return in about 3 months (around 12/23/2023) for 600 units botox both upper/lower ext.

## 2023-10-04 ENCOUNTER — Ambulatory Visit: Payer: Medicare Other

## 2023-10-04 ENCOUNTER — Encounter: Payer: Self-pay | Admitting: Pulmonary Disease

## 2023-10-04 ENCOUNTER — Ambulatory Visit: Payer: Medicare Other | Admitting: Pulmonary Disease

## 2023-10-04 VITALS — BP 110/84 | HR 85 | Ht 60.0 in

## 2023-10-04 DIAGNOSIS — Z23 Encounter for immunization: Secondary | ICD-10-CM | POA: Diagnosis not present

## 2023-10-04 DIAGNOSIS — J189 Pneumonia, unspecified organism: Secondary | ICD-10-CM

## 2023-10-04 DIAGNOSIS — Z950 Presence of cardiac pacemaker: Secondary | ICD-10-CM | POA: Diagnosis not present

## 2023-10-04 NOTE — Progress Notes (Signed)
[name]    [id number]    1986/01/05  Primary Care Physician:Blyth, Bryon Lions, MD  Referring Physician: Bradd Canary, MD [address] [place],  [place] [address]  Chief complaint: Follow up for recurrent pneumonias  HPI: 37 year old with history of cerebral palsy, recurrent pneumonia is due to poor secretion clearance. He is wheelchair-bound and needs assistance for all activities of daily living. History notable for T9 - C6 spine fusion at [facility] in September 2011, peg tube placed in in 2012 for hydration.  He has previously followed with Dr. Vassie Loll but not seen back in clinic for the past four years. He percussion vest at home and suction devices to help with mucus related clearance. He also uses Mucinex DS. Followed by palliative care at home with a DNR order. Family wishes that he stay out of the hospital as much as possible as he has difficult time with hospitalizations and would prefer to do as much of medical care at home  He has been struggling with pneumonia for the past month or two. He has taken 2 rounds of steroids, doxycycline, amoxicillin, azithromycin and omnicef from primary care office. Chest x-ray and early May showed right lower lobe infiltrate. Sputum cultures is growing pan sensitive Pseudomonas and streptococcus agalactiae  Continues to have recurrent cough with congestion and mucus production. No fevers or chills  Interim history: Discussed the use of AI scribe software for clinical note transcription with the patient, who gave verbal consent to proceed.  The patient, with a history of recurrent pneumonia, recently completed a course of antibiotics for pneumonia. The caregiver reports that the patient had a cough for four days, prompting a visit to the doctor. Upon examination, decreased breath sounds were noted in the right lung, and a subsequent chest x-ray confirmed pneumonia. The patient was treated with azithromycin and vantin, a new antibiotic,  for nine days, along with eight days of prednisone. The patient tolerated the new antibiotic well and did not exhibit any severe symptoms such as fever. The patient has a history of not tolerating cephalosporins, but did not have any adverse reactions to the new antibiotic.  The patient also had an episode of increased heart rate in October, which led to a brief course of atenolol. However, the medication has since been discontinued. The patient's mental status was also affected during this time, with reports of agitation, sleep disturbances, and discomfort.  The patient has a history of recurrent respiratory infections, with intervals of approximately six to eight weeks between episodes. The patient does not eat orally due to aspiration risk, and receives nutrition via a jejunal feeding tube. Despite this, there is concern that the patient may be aspirating saliva, contributing to the recurrent respiratory infections.   Outpatient Encounter Medications as of 10/04/2023  Medication Sig   albuterol (PROVENTIL) (5 MG/ML) 0.5% nebulizer solution Take 0.5 mLs (2.5 mg total) by nebulization every 6 (six) hours as needed for wheezing or shortness of breath.   AMBULATORY NON FORMULARY MEDICATION Medication Name: MIC gastrostomy/bolus feeding tube 24 French Part number 0110-24. #2 and 10 cc lurer lock syringe #2 Dx:   cetirizine HCl (ZYRTEC) 1 MG/ML solution Take 10 mLs (10 mg total) by mouth daily.   dantrolene (DANTRIUM) 50 MG capsule TAKE 1 CAPSULE BY MOUTH EVERY MORNING, 1 CAPSULE EVERY EVENING, AND 1 CAPSULE AT BEDTIME.   DEPAKOTE SPRINKLES 125 MG capsule Take by mouth.   diazepam (VALIUM) 5 MG tablet Take 5 mg by  mouth 2 (two) times daily.   doxepin (SINEQUAN) 25 MG capsule Take 25 mg by mouth at bedtime.   escitalopram (LEXAPRO) 5 MG/5ML solution Place 20 mLs into feeding tube daily.   Incontinence Supply Disposable (PREVAIL BREEZERS MEDIUM) MISC pkg of 16- size medium 32" to 44"  Breathable  cloth-like outer fabric (can't use the plastic outer surgace  Item # [id number]/[id number]   ketotifen (ZADITOR) 0.035 % ophthalmic solution Place 1 drop into both eyes in the morning and at bedtime.   lamoTRIgine (LAMICTAL) 25 MG tablet TAKE 2 TABLETS BY MOUTH 2 TIMES DAILY.   LORazepam (ATIVAN) 1 MG tablet Take 1 mg by mouth at bedtime.   Misc. Devices (ALL-BODY MASSAGE) MISC 1 Units/hr by Does not apply route as needed. Full body massage for Muscle spasticity due to Cerebral palsy   mupirocin ointment (BACTROBAN) 2 % Apply to area thin film twice daily if needed   NON FORMULARY Bard Leg Bag Extension tubing w/Connector 18", Sterile, latex-free  Item# [id number]   NON FORMULARY Colorplast Freedom Cath Latex Self-Adhering Male External Catheter 31mm Diameter Intermediate  Item# [id number]   NONFORMULARY OR COMPOUNDED Audelia Hives REF 191478 - Kangaroo Joey Pump Set with Flush Bags - 1000 mL   Nutritional Supplements (FEEDING SUPPLEMENT, KATE FARMS STANDARD 1.4,) LIQD liquid Take 325 mLs by mouth as directed. 3 carton over 16 hrs   OLANZapine (ZYPREXA) 10 MG tablet Take 10 mg by mouth at bedtime.   OLANZapine (ZYPREXA) 5 MG tablet Take by mouth.   Omeprazole-Sodium Bicarbonate (ZEGERID) 20-1100 MG CAPS capsule Take 1 capsule by mouth daily before breakfast.   Ostomy Supplies (PROTECTIVE BARRIER WIPES) MISC 1-1/4" X 3"  Item #[id number]   oxyBUTYnin (DITROPAN) 5 MG/5ML solution Take 5 mg by mouth 2 (two) times daily.   sodium chloride HYPERTONIC 3 % nebulizer solution Take by nebulization in the morning and at bedtime.   sucralfate (CARAFATE) 1 g tablet Take 1 tablet (1 g total) by mouth 4 (four) times daily -  before meals and at bedtime. TAKE 1 TABLET BY MOUTH 4 TIMES DAILY WITH MEALS AND AT BEDTIME   traZODone (DESYREL) 50 MG tablet Take 1 tablet (50 mg total) by mouth at bedtime.   [DISCONTINUED] atenolol (TENORMIN) 25 MG tablet Take 1 tablet (25 mg total) by mouth daily.   [DISCONTINUED] predniSONE  (DELTASONE) 10 MG tablet 3 po qd for 3 days then 2 po qd for 3 days the 1 po qd for 3 days   No facility-administered encounter medications on file as of 10/04/2023.   Physical Exam: Blood pressure 124/64, pulse 88, weight 92 lb (41.7 kg), SpO2 97 %. Gen:      No acute distress HEENT:  EOMI, sclera anicteric Neck:     No masses; no thyromegaly Lungs:    Clear to auscultation bilaterally; normal respiratory effort CV:         Regular rate and rhythm; no murmurs Abd:      + bowel sounds; soft, non-tender; no palpable masses, no distension Ext:    No edema; adequate peripheral perfusion Skin:      Warm and dry; no rash Neuro: alert and oriented x 3 Psych: normal mood and affect  Data Reviewed: Imaging: CT chest 01/16/15 - volume loss in the right lower lobe suggest affronted atelectasis, pleural fluid collection versus pleural fluid in the right hemothorax.  Chest x-ray 03/03/22 - new right lower lobe infiltrate. Chest x-ray 08/22/2023-chronic right hilar opacity, small right effusion Chest x-ray 09/13/2023-new  right lower lobe airspace disease I have reviewed images personally.  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 Sputum cultures grew Pseudomonas in past. Recent episode treated with Azithromycin and Vantin. Currently asymptomatic but with residual cough. No fever or other signs of active infection.  -Order chest x-ray today to assess resolution of pneumonia. -No antibiotics needed at this time. -Follow-up in 6 months or sooner if symptoms recur. - Continue mucociliary clearance with percussion vest cough assist and mucinex  Aspiration Risk History of recurrent pneumonia, possibly secondary to aspiration. Patient is fed via jejunal tube due to risk of aspiration with oral  intake. -Continue jejunal feeding. -Monitor for signs of aspiration pneumonia.  Tachycardia (resolved) Previous episode of increased heart rate, treated with Atenolol but now discontinued. No current symptoms. -No action needed at this time.   Plan/Recommendations: Chest x ray  Chilton Greathouse MD [facility] 10/04/2023, 1:13 PM  CC: Bradd Canary, MD

## 2023-10-04 NOTE — Patient Instructions (Signed)
VISIT SUMMARY:  During today's visit, we reviewed your recent health concerns, including a recent episode of pneumonia, your risk of aspiration, and a past episode of increased heart rate. You have completed your antibiotics and are currently asymptomatic, though you still have a residual cough. We discussed your feeding method and the potential for aspiration, as well as your resolved heart rate issue.  YOUR PLAN:  -RECURRENT PNEUMONIA: Recurrent pneumonia means you have had multiple episodes of lung infection. Your recent pneumonia was treated with Azithromycin and Bantam, and you are currently without symptoms except for a residual cough. We will do a chest x-ray today to ensure the pneumonia has resolved. No further antibiotics are needed at this time. Please follow up in 6 months or sooner if symptoms return.  -ASPIRATION RISK: Aspiration risk means there is a chance that food, liquid, or saliva could enter your lungs, causing infections like pneumonia. You are fed through a jejunal tube to prevent this. We will continue with this feeding method and monitor for any signs of aspiration pneumonia.  -TACHYCARDIA (RESOLVED): Tachycardia is a condition where the heart beats faster than normal. You had an episode in October that was treated with Atenolol, but the medication has been discontinued as your heart rate has returned to normal. No further action is needed at this time.  INSTRUCTIONS:  Please get a chest x-ray today to check the resolution of your pneumonia. Follow up in 6 months or sooner if you experience any recurring symptoms.

## 2023-10-05 ENCOUNTER — Ambulatory Visit (INDEPENDENT_AMBULATORY_CARE_PROVIDER_SITE_OTHER): Payer: Medicare Other

## 2023-10-05 VITALS — Ht 60.0 in | Wt 92.0 lb

## 2023-10-05 DIAGNOSIS — Z0000 Encounter for general adult medical examination without abnormal findings: Secondary | ICD-10-CM

## 2023-10-05 NOTE — Patient Instructions (Addendum)
Mr. [name] , Thank you for taking time to come for your Medicare Wellness Visit. I appreciate your ongoing commitment to your health goals. Please review the following plan we discussed and let me know if I can assist you in the future.   Referrals/Orders/Follow-Ups/Clinician Recommendations:   This is a list of the screening recommended for you and due dates:  Health Maintenance  Topic Date Due   HIV Screening  Never done   Hepatitis C Screening  Never done   DTaP/Tdap/Td vaccine (3 - Tdap) 06/20/2019   COVID-19 Vaccine (5 - 2023-24 season) 07/04/2023   Medicare Annual Wellness Visit  10/04/2024   Flu Shot  Completed   HPV Vaccine  Aged Out    Advanced directives: (In Chart) A copy of your advanced directives are scanned into your chart should your provider ever need it.  Next Medicare Annual Wellness Visit scheduled for next year: Yes  Insert Preventive Care Attachment Reference

## 2023-10-05 NOTE — Progress Notes (Signed)
Subjective:   [name] is a 37 y.o. male who presents for Medicare Annual/Subsequent preventive examination.  Visit Complete: Virtual I connected with  [name] on 10/05/23 by a audio enabled telemedicine application and verified that I am speaking with the correct person using two identifiers.  Patient Location: Home  Provider Location: Home Office  I discussed the limitations of evaluation and management by telemedicine. The patient expressed understanding and agreed to proceed.  Vital Signs: Because this visit [name] a virtual/telehealth visit, some criteria may be missing or patient reported. Any vitals not documented were not able to be obtained and vitals that have been documented are patient reported.    Cardiac Risk Factors include: advanced age (>40men, >52 women);sedentary lifestyle;male gender     Objective:    Today's Vitals   10/05/23 1143  Weight: 92 lb (41.7 kg)  Height: 5' (1.524 m)   Body mass index is 17.97 kg/m.     10/05/2023   12:00 PM 08/06/2023    3:54 PM 03/26/2023   12:19 PM 08/07/2020    4:37 PM 06/15/2017    4:19 PM 05/18/2017    1:17 PM 05/04/2017    1:18 PM  Advanced Directives  Does Patient Have a Medical Advance Directive? Yes No Yes No No No No  Type of Estate agent of [place];Living will  Healthcare Power of Attorney      Does patient want to make changes to medical advance directive? No - Patient declined  No - Patient declined      Copy of Healthcare Power of Attorney in Chart? Yes - validated most recent copy scanned in chart (See row information)  Yes - validated most recent copy scanned in chart (See row information)      Would patient like information on creating a medical advance directive?    No - Patient declined No - Patient declined No - Patient declined No - Patient declined    Current Medications (verified) Outpatient Encounter Medications as of 10/05/2023  Medication Sig   albuterol (PROVENTIL)  (5 MG/ML) 0.5% nebulizer solution Take 0.5 mLs (2.5 mg total) by nebulization every 6 (six) hours as needed for wheezing or shortness of breath.   AMBULATORY NON FORMULARY MEDICATION Medication Name: MIC gastrostomy/bolus feeding tube 24 French Part number 0110-24. #2 and 10 cc lurer lock syringe #2 Dx:   cetirizine HCl (ZYRTEC) 1 MG/ML solution Take 10 mLs (10 mg total) by mouth daily.   dantrolene (DANTRIUM) 50 MG capsule TAKE 1 CAPSULE BY MOUTH EVERY MORNING, 1 CAPSULE EVERY EVENING, AND 1 CAPSULE AT BEDTIME.   DEPAKOTE SPRINKLES 125 MG capsule Take by mouth.   diazepam (VALIUM) 5 MG tablet Take 5 mg by mouth 2 (two) times daily.   doxepin (SINEQUAN) 25 MG capsule Take 25 mg by mouth at bedtime.   escitalopram (LEXAPRO) 5 MG/5ML solution Place 20 mLs into feeding tube daily.   Incontinence Supply Disposable (PREVAIL BREEZERS MEDIUM) MISC pkg of 16- size medium 32" to 44"  Breathable cloth-like outer fabric (can't use the plastic outer surgace  Item # [id number]/[id number]   ketotifen (ZADITOR) 0.035 % ophthalmic solution Place 1 drop into both eyes in the morning and at bedtime.   lamoTRIgine (LAMICTAL) 25 MG tablet TAKE 2 TABLETS BY MOUTH 2 TIMES DAILY.   LORazepam (ATIVAN) 1 MG tablet Take 1 mg by mouth at bedtime.   Misc. Devices (ALL-BODY MASSAGE) MISC 1 Units/hr by Does not apply route as needed. Full body  massage for Muscle spasticity due to Cerebral palsy   mupirocin ointment (BACTROBAN) 2 % Apply to area thin film twice daily if needed   NON FORMULARY Bard Leg Bag Extension tubing w/Connector 18", Sterile, latex-free  Item# [id number]   NON FORMULARY Colorplast Freedom Cath Latex Self-Adhering Male External Catheter 31mm Diameter Intermediate  Item# [id number]   NONFORMULARY OR COMPOUNDED Audelia Hives REF [id number] - Kangaroo Joey Pump Set with Flush Bags - 1000 mL   Nutritional Supplements (FEEDING SUPPLEMENT, KATE FARMS STANDARD 1.4,) LIQD liquid Take 325 mLs by mouth as directed. 3 carton over  16 hrs   OLANZapine (ZYPREXA) 10 MG tablet Take 10 mg by mouth at bedtime.   OLANZapine (ZYPREXA) 5 MG tablet Take by mouth.   Omeprazole-Sodium Bicarbonate (ZEGERID) 20-1100 MG CAPS capsule Take 1 capsule by mouth daily before breakfast.   Ostomy Supplies (PROTECTIVE BARRIER WIPES) MISC 1-1/4" X 3"  Item #[id number]   oxyBUTYnin (DITROPAN) 5 MG/5ML solution Take 5 mg by mouth 2 (two) times daily.   sodium chloride HYPERTONIC 3 % nebulizer solution Take by nebulization in the morning and at bedtime.   sucralfate (CARAFATE) 1 g tablet Take 1 tablet (1 g total) by mouth 4 (four) times daily -  before meals and at bedtime. TAKE 1 TABLET BY MOUTH 4 TIMES DAILY WITH MEALS AND AT BEDTIME   traZODone (DESYREL) 50 MG tablet Take 1 tablet (50 mg total) by mouth at bedtime.   No facility-administered encounter medications on file as of 10/05/2023.    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   GERD (gastroesophageal reflux disease)    Hyperlipidemia, mild 08/25/2015   Hyperthyroidism    Incontinence of feces    Loss of weight 08/28/2014   Medicare annual wellness visit, subsequent 08/25/2015   Mildly underweight adult 03/16/2017   Palpitations    PALSY, INFANTILE CEREBRAL, 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   Qualifier: Diagnosis of  By: Janit Bern  Working with PMR Dr Hermelinda Medicus and tolerating Botox injections in hips, shoulders, etc with good results   Skin lesion of right ear 03/16/2017   Thyroid disease 08/01/2010    Qualifier: Diagnosis of  By: Candice Camp CMA (AAMA), Dottie     Past Surgical History:  Procedure Laterality Date   baclofen trial     baslofen pump implant     ears tubes     EYE SURGERY     FLEXIBLE SIGMOIDOSCOPY N/A 09/07/2014   Procedure: FLEXIBLE SIGMOIDOSCOPY;  Surgeon: Beverley Fiedler, MD;  Location: [facility];  Service: Endoscopy;  Laterality: N/A;   g-tube insert  August 2006   hamstring released     to treat contractures.    HIP SURGERY     x2 , side    IR CM INJ ANY COLONIC TUBE W/FLUORO  05/28/2017   IR CM INJ ANY COLONIC TUBE W/FLUORO  07/07/2019   IR CM INJ ANY COLONIC TUBE W/FLUORO  09/18/2019   IR GASTR TUBE CONVERT GASTR-JEJ PER W/FL MOD SED  03/14/2019   IR GENERIC HISTORICAL  07/01/2016   IR GASTR TUBE CONVERT GASTR-JEJ PER W/FL MOD SED 07/01/2016  Irish Lack, MD [facility]   IR GENERIC HISTORICAL  07/08/2016   IR PATIENT EVAL TECH 0-60 MINS 07/08/2016 Irish Lack, MD [facility]   IR GENERIC HISTORICAL  07/14/2016   IR GJ TUBE CHANGE 07/14/2016 Simonne Come, MD [facility]   IR GENERIC HISTORICAL  07/21/2016   IR PATIENT EVAL TECH 0-60 MINS [facility]   IR GENERIC HISTORICAL  08/31/2016   IR REPLC DUODEN/JEJUNO TUBE PERCUT W/FLUORO 08/31/2016 Berdine Dance, MD [facility]   IR GENERIC HISTORICAL  09/03/2016   IR GJ TUBE CHANGE 09/03/2016 Simonne Come, MD [facility]   IR GENERIC HISTORICAL  09/10/2016   IR GASTR TUBE CONVERT GASTR-JEJ PER W/FL MOD SED 09/10/2016 [facility]   IR GENERIC HISTORICAL  09/16/2016   IR PATIENT EVAL TECH 0-60 MINS [facility]   IR GENERIC HISTORICAL  09/29/2016   IR GJ TUBE CHANGE 09/29/2016 Oley Balm, MD [facility]   IR GJ TUBE CHANGE  02/05/2017   IR GJ TUBE CHANGE  05/21/2017   IR GJ TUBE CHANGE  08/25/2017   IR GJ TUBE CHANGE  01/18/2018   IR GJ TUBE CHANGE  02/04/2018   IR GJ TUBE CHANGE  04/21/2018   IR GJ TUBE CHANGE  08/18/2018   IR GJ TUBE CHANGE  09/12/2019   IR GJ TUBE CHANGE  01/03/2020   IR GJ TUBE CHANGE   05/13/2020   IR GJ TUBE CHANGE  09/11/2020   IR GJ TUBE CHANGE  02/27/2021   IR GJ TUBE CHANGE  08/01/2021   IR GJ TUBE CHANGE  12/31/2021   IR GJ TUBE CHANGE  05/11/2022   IR GJ TUBE CHANGE  07/01/2022   IR GJ TUBE CHANGE  01/28/2023   IR MECH REMOV OBSTRUC MAT ANY COLON TUBE W/FLUORO  09/02/2018   IR REPLC GASTRO/COLONIC TUBE PERCUT W/FLUORO  10/17/2018   IR REPLC GASTRO/COLONIC TUBE PERCUT W/FLUORO  04/02/2023   PEG PLACEMENT  10/21/2011   Procedure: PERCUTANEOUS ENDOSCOPIC GASTROSTOMY (PEG) REPLACEMENT;  Surgeon: Hart Carwin, MD;  Location: [facility];  Service: Endoscopy;  Laterality: N/A;   PEG PLACEMENT N/A 06/13/2013   Procedure: PERCUTANEOUS ENDOSCOPIC GASTROSTOMY (PEG) REPLACEMENT;  Surgeon: Hart Carwin, MD;  Location: [facility];  Service: Endoscopy;  Laterality: N/A;   SPINAL FUSION     spinal fusion to correct 70 degree kyphosis  11-2010   spinal fusioncorrect 106 degree kyphosis     SPINE SURGERY  ,11/20/2010, 2011   for correction of severe contracturing spinal kyphosis.    TONSILLECTOMY     Family History  Problem Relation Age of Onset   Asthma Mother    Hyperlipidemia Mother    COPD Mother    Other Mother        bronchial stasis/ABPA   Cancer Maternal Grandmother 76       breast   Hyperlipidemia Maternal Grandmother    Hypertension Maternal Grandmother    Cancer Maternal Grandfather        prostate   Heart disease Paternal Grandfather        CHF   Osteoporosis Paternal Grandmother    Arthritis Paternal Grandmother        rheumatoid   Social History   Socioeconomic History   Marital status: Single    Spouse name: Not on file   Number of children: 0   Years of education: Not on file   Highest education level: Not on file  Occupational History   Occupation: disbaled  Tobacco Use  Smoking status: Never   Smokeless tobacco: Never  Vaping Use   Vaping status: Never Used  Substance and Sexual Activity   Alcohol use: No   Drug use: No   Sexual activity:  Never  Other Topics Concern   Not on file  Social History Narrative   Not on file   Social Determinants of Health   Financial Resource Strain: Low Risk  (10/05/2023)   Overall Financial Resource Strain (CARDIA)    Difficulty of Paying Living Expenses: Not hard at all  Food Insecurity: No Food Insecurity (10/05/2023)   Hunger Vital Sign    Worried About Running Out of Food in the Last Year: Never true    Ran Out of Food in the Last Year: Never true  Transportation Needs: No Transportation Needs (10/05/2023)   PRAPARE - Administrator, Civil Service (Medical): No    Lack of Transportation (Non-Medical): No  Physical Activity: Inactive (10/05/2023)   Exercise Vital Sign    Days of Exercise per Week: 0 days    Minutes of Exercise per Session: 0 min  Stress: No Stress Concern Present (10/05/2023)   [employer] of Occupational Health - Occupational Stress Questionnaire    Feeling of Stress : Not at all  Social Connections: Moderately Integrated (10/05/2023)   Social Connection and Isolation Panel [NHANES]    Frequency of Communication with Friends and Family: More than three times a week    Frequency of Social Gatherings with Friends and Family: More than three times a week    Attends Religious Services: More than 4 times per year    Active Member of [employer] or Organizations: Yes    Attends Engineer, structural: More than 4 times per year    Marital Status: Never married    Tobacco Counseling Counseling given: Not Answered   Clinical Intake:  Pre-visit preparation completed: Yes  Pain : No/denies pain     BMI - recorded: 17.97 Nutritional Status: BMI <19  Underweight Nutritional Risks: None Diabetes: No  How often do you need to have someone help you when you read instructions, pamphlets, or other written materials from your doctor or pharmacy?: 5 - Always (Mother assist)  Interpreter Needed?: No  Comments: Patient totally dependant. Mother and  aids assist Information entered by :: Theresa Mulligan LPN   Activities of Daily Living    10/05/2023   11:58 AM  In your present state of health, do you have any difficulty performing the following activities:  Hearing? 0  Vision? 0  Difficulty concentrating or making decisions? 1  Walking or climbing stairs? 1  Dressing or bathing? 1  Doing errands, shopping? 1  Preparing Food and eating ? Y  Using the Toilet? Y  In the past six months, have you accidently leaked urine? Y  Do you have problems with loss of bowel control? Y  Managing your Medications? Y  Managing your Finances? Y  Housekeeping or managing your Housekeeping? Y    Patient Care Team: Bradd Canary, MD as PCP - General (Family Medicine)  Indicate any recent Medical Services you may have received from other than [facility] providers in the past year (date may be approximate).     Assessment:   This is a routine wellness examination for [place].  Hearing/Vision screen Hearing Screening - Comments:: Denies hearing difficulties   Vision Screening - Comments:: - Not up to date with routine eye exams with  Pending appt Dr Hyacinth Meeker   Goals Addressed  This Visit's Progress     No current goals (pt-stated)         Depression Screen    10/05/2023   11:57 AM 09/22/2023   12:54 PM 08/03/2023    1:08 PM 07/29/2023    2:01 PM 04/26/2023    2:59 PM 03/10/2023   11:41 AM 01/18/2023    9:53 AM  PHQ 2/9 Scores  PHQ - 2 Score 0 0 0 0 0 0 0  PHQ- 9 Score   0 0   0    Fall Risk    10/05/2023   11:59 AM 09/22/2023   12:54 PM 08/03/2023    1:08 PM 07/29/2023    2:01 PM 04/26/2023    2:59 PM  Fall Risk   Falls in the past year? 0 0 0 Exclusion - non ambulatory 0  Number falls in past yr: 0  0  0  Injury with Fall? 0  0  0  Risk for fall due to : No Fall Risks      Follow up Falls prevention discussed  Falls evaluation completed Falls evaluation completed Falls evaluation completed    MEDICARE RISK AT  HOME: Medicare Risk at Home Any stairs in or around the home?: Yes If so, are there any without handrails?: No Home free of loose throw rugs in walkways, pet beds, electrical cords, etc?: Yes Adequate lighting in your home to reduce risk of falls?: Yes Life alert?: No Use of a cane, walker or w/c?: Yes Grab bars in the bathroom?: Yes Shower chair or bench in shower?: Yes Elevated toilet seat or a handicapped toilet?: Yes  TIMED UP AND GO:  [name] the test performed?  No    Cognitive Function:        10/05/2023   12:01 PM  6CIT Screen  What Year? --  What month? --  What time? --  Count back from 20 --  Months in reverse --  Repeat phrase --    Immunizations Immunization History  Administered Date(s) Administered   Hep A / Hep B 04/07/2011, 05/14/2011   Influenza Split 09/01/2011   Influenza Whole 10/11/2007, 08/22/2008, 08/15/2009, 08/05/2010, 08/11/2012   Influenza, Seasonal, Injecte, Preservative Fre 10/04/2023   Influenza,inj,Quad PF,6+ Mos 08/06/2013, 07/16/2014, 08/15/2015, 08/27/2016, 08/26/2017, 08/16/2018, 09/02/2020, 08/07/2021, 09/01/2022   Influenza-Unspecified 08/26/2017, 09/07/2023   Moderna Sars-Covid-2 Vaccination 12/28/2019, 01/30/2020, 09/28/2020   PPD Test 04/07/2011   Pfizer Covid-19 Vaccine Bivalent Booster 2yrs & up 08/29/2021   Pneumococcal Conjugate-13 08/27/2016   Pneumococcal Polysaccharide-23 08/15/2009   Td 06/14/2002, 06/19/2009    TDAP status: Due, Education has been provided regarding the importance of this vaccine. Advised may receive this vaccine at local pharmacy or Health Dept. Aware to provide a copy of the vaccination record if obtained from local pharmacy or Health Dept. Verbalized acceptance and understanding.  Flu Vaccine status: Up to date    Covid-19 vaccine status: Declined, Education has been provided regarding the importance of this vaccine but patient still declined. Advised may receive this vaccine at local pharmacy or  Health Dept.or vaccine clinic. Aware to provide a copy of the vaccination record if obtained from local pharmacy or Health Dept. Verbalized acceptance and understanding.    Screening Tests Health Maintenance  Topic Date Due   HIV Screening  Never done   Hepatitis C Screening  Never done   DTaP/Tdap/Td (3 - Tdap) 06/20/2019   COVID-19 Vaccine (5 - 2023-24 season) 07/04/2023   Medicare Annual Wellness (AWV)  10/04/2024  INFLUENZA VACCINE  Completed   HPV VACCINES  Aged Out    Health Maintenance  Health Maintenance Due  Topic Date Due   HIV Screening  Never done   Hepatitis C Screening  Never done   DTaP/Tdap/Td (3 - Tdap) 06/20/2019   COVID-19 Vaccine (5 - 2023-24 season) 07/04/2023        Additional Screening:  Hepatitis C Screening: does qualify;  Deferred  Vision Screening: Recommended annual ophthalmology exams for early detection of glaucoma and other disorders of the eye. Is the patient up to date with their annual eye exam?  No  Who is the provider or what is the name of the office in which the patient attends annual eye exams? Pending appt Dr Hyacinth Meeker  If pt is not established with a provider, would they like to be referred to a provider to establish care? No .   Dental Screening: Recommended annual dental exams for proper oral hygiene    Community Resource Referral / Chronic Care Management: CRR required this visit?  No   CCM required this visit?  No     Plan:     I have personally reviewed and noted the following in the patient's chart:   Medical and social history Use of alcohol, tobacco or illicit drugs  Current medications and supplements including opioid prescriptions. Patient is not currently taking opioid prescriptions. Functional ability and status Nutritional status Physical activity Advanced directives List of other physicians Hospitalizations, surgeries, and ER visits in previous 12 months Vitals Screenings to include cognitive,  depression, and falls Referrals and appointments  In addition, I have reviewed and discussed with patient certain preventive protocols, quality metrics, and best practice recommendations. A written personalized care plan for preventive services as well as general preventive health recommendations were provided to patient.     Tillie Rung, LPN   12/09/2534   After Visit Summary: (MyChart) Due to this being a telephonic visit, the after visit summary with patients personalized plan [name] offered to patient via MyChart   Nurse Notes: None

## 2023-10-08 ENCOUNTER — Ambulatory Visit (INDEPENDENT_AMBULATORY_CARE_PROVIDER_SITE_OTHER): Payer: Medicare Other | Admitting: Gastroenterology

## 2023-10-08 ENCOUNTER — Encounter: Payer: Self-pay | Admitting: Gastroenterology

## 2023-10-08 VITALS — BP 98/62 | HR 105 | Ht 60.0 in | Wt 92.0 lb

## 2023-10-08 DIAGNOSIS — G800 Spastic quadriplegic cerebral palsy: Secondary | ICD-10-CM

## 2023-10-08 DIAGNOSIS — Z931 Gastrostomy status: Secondary | ICD-10-CM | POA: Diagnosis not present

## 2023-10-08 DIAGNOSIS — K5909 Other constipation: Secondary | ICD-10-CM | POA: Diagnosis not present

## 2023-10-08 DIAGNOSIS — K219 Gastro-esophageal reflux disease without esophagitis: Secondary | ICD-10-CM | POA: Diagnosis not present

## 2023-10-08 MED ORDER — SUCRALFATE 1 G PO TABS: 1.0000 g | ORAL_TABLET | Freq: Every day | ORAL | 3 refills | Status: DC

## 2023-10-08 NOTE — Patient Instructions (Signed)
We have sent the following medications to your pharmacy for you to pick up at your convenience:  Carafate.  _______________________________________________________  If your blood pressure at your visit was 140/90 or greater, please contact your primary care physician to follow up on this.  _______________________________________________________  If you are age 39 or older, your body mass index should be between 23-30. Your Body mass index is 17.97 kg/m. If this is out of the aforementioned range listed, please consider follow up with your Primary Care Provider.  If you are age 68 or younger, your body mass index should be between 19-25. Your Body mass index is 17.97 kg/m. If this is out of the aformentioned range listed, please consider follow up with your Primary Care Provider.   ________________________________________________________  The [place] [facility] providers would like to encourage you to use [facility] to communicate with providers for non-urgent requests or questions.  Due to long hold times on the telephone, sending your provider a message by [facility] may be a faster and more efficient way to get a response.  Please allow 48 business hours for a response.  Please remember that this is for non-urgent requests.  _______________________________________________________

## 2023-10-08 NOTE — Progress Notes (Signed)
10/08/2023 EMMERSON STENSRUD [id number] 10/21/1986   HISTORY OF PRESENT ILLNESS:  Kai Furukawa is a 37 year old male with a history of cerebral palsy with quadriplegia/spasticity, severe scoliosis/kyphosis with prior spinal fusion, esophageal dysphagia and chronic GJ tube for medicine/feeding who is here for follow-up.  He is here today with his mother and father and was last seen on 03/12/2020.   Overall [name] is doing fairly well.  He was having issues with secretions and aspiration with pneumonia.  His tube feeding formula was switched on advice from his dietitian to a plant-based all organic formula called Molli Posey.  After a few weeks of this change his secretions have improved.  He still does have periods where his secretions seem to be more and he will require frequent suctioning.  He continues Zegerid daily and has been back on the carafate once daily for about the past year or so.  They need that refilled so this visit was made.  His GJ tube is functioning well.  They give him smooth move tea and probiotics to help him move his bowels.  They do not like the Miralax because they think that it makes is bowels too sticky.  He is taking nothing by mouth.  His mother works diligently multiple times per day on oral care.    Past Medical History:  Diagnosis Date   Cerebral palsy (HCC)    Dehydration 11/22/2013   Depression with anxiety 08/01/2010   Qualifier: Diagnosis of  By: Nelson-Smith CMA (AAMA), Dottie     Dyslipidemia 08/19/2017   Esophagitis 2011   Gastrostomy in place [facility]) 08/31/2013   GERD (gastroesophageal reflux disease)    Hyperlipidemia, mild 08/25/2015   Hyperthyroidism    Incontinence of feces    Loss of weight 08/28/2014   Medicare annual wellness visit, subsequent 08/25/2015   Mildly underweight adult 03/16/2017   Palpitations    PALSY, INFANTILE CEREBRAL, 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   Qualifier: Diagnosis of  By: Janit Bern  Working with PMR Dr Hermelinda Medicus and tolerating  Botox injections in hips, shoulders, etc with good results   Skin lesion of right ear 03/16/2017   Thyroid disease 08/01/2010   Qualifier: Diagnosis of  By: Candice Camp CMA (AAMA), Dottie     Past Surgical History:  Procedure Laterality Date   baclofen trial     baslofen pump implant     ears tubes     EYE SURGERY     FLEXIBLE SIGMOIDOSCOPY N/A 09/07/2014   Procedure: FLEXIBLE SIGMOIDOSCOPY;  Surgeon: Beverley Fiedler, MD;  Location: [facility] ENDOSCOPY;  Service: Endoscopy;  Laterality: N/A;   g-tube insert  August 2006   hamstring released     to treat contractures.    HIP SURGERY     x2 , side    IR CM INJ ANY COLONIC TUBE W/FLUORO  05/28/2017   IR CM INJ ANY COLONIC TUBE W/FLUORO  07/07/2019   IR CM INJ ANY COLONIC TUBE W/FLUORO  09/18/2019   IR CM INJ ANY COLONIC TUBE W/FLUORO  05/21/2023   IR GASTR TUBE CONVERT GASTR-JEJ PER W/FL MOD SED  03/14/2019   IR GENERIC HISTORICAL  07/01/2016   IR GASTR TUBE CONVERT GASTR-JEJ PER W/FL MOD SED 07/01/2016 Irish Lack, MD [facility]   IR GENERIC HISTORICAL  07/08/2016   IR PATIENT EVAL TECH 0-60 MINS 07/08/2016 Irish Lack, MD [facility]   IR GENERIC HISTORICAL  07/14/2016   IR GJ TUBE CHANGE 07/14/2016 Simonne Come, MD [facility]  IR GENERIC HISTORICAL  07/21/2016   IR PATIENT EVAL TECH 0-60 MINS [facility]   IR GENERIC HISTORICAL  08/31/2016   IR REPLC DUODEN/JEJUNO TUBE PERCUT W/FLUORO 08/31/2016 Berdine Dance, MD [facility]   IR GENERIC HISTORICAL  09/03/2016   IR GJ TUBE CHANGE 09/03/2016 Simonne Come, MD [facility]   IR GENERIC HISTORICAL  09/10/2016   IR GASTR TUBE CONVERT GASTR-JEJ PER W/FL MOD SED 09/10/2016 [facility]   IR GENERIC HISTORICAL  09/16/2016   IR PATIENT EVAL TECH 0-60 MINS [facility]   IR GENERIC HISTORICAL  09/29/2016   IR GJ TUBE CHANGE 09/29/2016 Oley Balm, MD [facility]   IR GJ TUBE CHANGE  02/05/2017   IR GJ TUBE CHANGE  05/21/2017   IR GJ TUBE CHANGE  08/25/2017   IR GJ TUBE CHANGE  01/18/2018   IR  GJ TUBE CHANGE  02/04/2018   IR GJ TUBE CHANGE  04/21/2018   IR GJ TUBE CHANGE  08/18/2018   IR GJ TUBE CHANGE  09/12/2019   IR GJ TUBE CHANGE  01/03/2020   IR GJ TUBE CHANGE  05/13/2020   IR GJ TUBE CHANGE  09/11/2020   IR GJ TUBE CHANGE  02/27/2021   IR GJ TUBE CHANGE  08/01/2021   IR GJ TUBE CHANGE  12/31/2021   IR GJ TUBE CHANGE  05/11/2022   IR GJ TUBE CHANGE  07/01/2022   IR GJ TUBE CHANGE  01/28/2023   IR MECH REMOV OBSTRUC MAT ANY COLON TUBE W/FLUORO  09/02/2018   IR REPLC GASTRO/COLONIC TUBE PERCUT W/FLUORO  10/17/2018   IR REPLC GASTRO/COLONIC TUBE PERCUT W/FLUORO  04/02/2023   PEG PLACEMENT  10/21/2011   Procedure: PERCUTANEOUS ENDOSCOPIC GASTROSTOMY (PEG) REPLACEMENT;  Surgeon: Hart Carwin, MD;  Location: [facility] ENDOSCOPY;  Service: Endoscopy;  Laterality: N/A;   PEG PLACEMENT N/A 06/13/2013   Procedure: PERCUTANEOUS ENDOSCOPIC GASTROSTOMY (PEG) REPLACEMENT;  Surgeon: Hart Carwin, MD;  Location: [facility]OS[facility];  Service: Endoscopy;  Laterality: N/A;   SPINAL FUSION     spinal fusion to correct 70 degree kyphosis  11-2010   spinal fusioncorrect 106 degree kyphosis     SPINE SURGERY  ,11/20/2010, 2011   for correction of severe contracturing spinal kyphosis.    TONSILLECTOMY      reports that he has never smoked. He has never used smokeless tobacco. He reports that he does not drink alcohol and does not use drugs. family history includes Arthritis in his paternal grandmother; Asthma in his mother; COPD in his mother; Cancer in his maternal grandfather; Cancer (age of onset: 75) in his maternal grandmother; Heart disease in his paternal grandfather; Hyperlipidemia in his maternal grandmother and mother; Hypertension in his maternal grandmother; Osteoporosis in his paternal grandmother; Other in his mother. Allergies  Allergen Reactions   Ambien [Zolpidem Tartrate] Nausea Only   Antihistamines, Chlorpheniramine-Type     Other reaction(s): Other (See Comments) Other Reaction: agitation    Augmentin [Amoxicillin-Pot Clavulanate] Diarrhea   Cefdinir Diarrhea    Not tolerate well and cause diarrhea   Codeine Other (See Comments)    Makes patient too active after a few days.   Zolpidem     Other Reaction(s): GI Intolerance   Baclofen Anxiety and Swelling    anxiety   Metoclopramide Other (See Comments) and Swelling    Delusion, emotionality  Other Reaction(s): Mental Status Changes   Pheniramine Rash    Other reaction(s): Other (See Comments)  Other Reaction: agitation  Other Reaction(s): Other (See  Comments)  Other reaction(s): Other (See Comments), Other Reaction: agitation   Sulfa Antibiotics Rash   Sulfonamide Derivatives Rash      Outpatient Encounter Medications as of 10/08/2023  Medication Sig   albuterol (PROVENTIL) (5 MG/ML) 0.5% nebulizer solution Take 0.5 mLs (2.5 mg total) by nebulization every 6 (six) hours as needed for wheezing or shortness of breath.   AMBULATORY NON FORMULARY MEDICATION Medication Name: MIC gastrostomy/bolus feeding tube 24 French Part number 0110-24. #2 and 10 cc lurer lock syringe #2 Dx:   dantrolene (DANTRIUM) 50 MG capsule TAKE 1 CAPSULE BY MOUTH EVERY MORNING, 1 CAPSULE EVERY EVENING, AND 1 CAPSULE AT BEDTIME.   DEPAKOTE SPRINKLES 125 MG capsule Take by mouth.   diazepam (VALIUM) 5 MG tablet Take 5 mg by mouth 2 (two) times daily.   doxepin (SINEQUAN) 25 MG capsule Take 25 mg by mouth at bedtime.   escitalopram (LEXAPRO) 5 MG/5ML solution Place 20 mLs into feeding tube daily.   Incontinence Supply Disposable (PREVAIL BREEZERS MEDIUM) MISC pkg of 16- size medium 32" to 44"  Breathable cloth-like outer fabric (can't use the plastic outer surgace  Item # [id number]-012/2   ketotifen (ZADITOR) 0.035 % ophthalmic solution Place 1 drop into both eyes in the morning and at bedtime.   lamoTRIgine (LAMICTAL) 25 MG tablet TAKE 2 TABLETS BY MOUTH 2 TIMES DAILY.   LORazepam (ATIVAN) 1 MG tablet Take 1 mg by mouth at bedtime.   Misc. Devices  (ALL-BODY MASSAGE) MISC 1 Units/hr by Does not apply route as needed. Full body massage for Muscle spasticity due to Cerebral palsy   mupirocin ointment (BACTROBAN) 2 % Apply to area thin film twice daily if needed   NON FORMULARY Bard Leg Bag Extension tubing w/Connector 18", Sterile, latex-free  Item# [id number]   NON FORMULARY Colorplast Freedom Cath Latex Self-Adhering Male External Catheter 31mm Diameter Intermediate  Item# [id number]   NONFORMULARY OR COMPOUNDED Audelia Hives REF [id number] - Kangaroo Joey Pump Set with Flush Bags - 1000 mL   Nutritional Supplements (FEEDING SUPPLEMENT, KATE FARMS STANDARD 1.4,) LIQD liquid Take 325 mLs by mouth as directed. 3 carton over 16 hrs   OLANZapine (ZYPREXA) 10 MG tablet Take 10 mg by mouth at bedtime.   OLANZapine (ZYPREXA) 5 MG tablet Take by mouth.   Omeprazole-Sodium Bicarbonate (ZEGERID) 20-1100 MG CAPS capsule Take 1 capsule by mouth daily before breakfast.   Ostomy Supplies (PROTECTIVE BARRIER WIPES) MISC 1-1/4" X 3"  Item #[id number]   oxyBUTYnin (DITROPAN) 5 MG/5ML solution Take 5 mg by mouth 2 (two) times daily.   sodium chloride HYPERTONIC 3 % nebulizer solution Take by nebulization in the morning and at bedtime.   traZODone (DESYREL) 50 MG tablet Take 1 tablet (50 mg total) by mouth at bedtime. (Patient taking differently: Take 150 mg by mouth at bedtime.)   [DISCONTINUED] sucralfate (CARAFATE) 1 g tablet Take 1 tablet (1 g total) by mouth 4 (four) times daily -  before meals and at bedtime. TAKE 1 TABLET BY MOUTH 4 TIMES DAILY WITH MEALS AND AT BEDTIME   sucralfate (CARAFATE) 1 g tablet Take 1 tablet (1 g total) by mouth daily. TAKE 1 TABLET BY MOUTH 4 TIMES DAILY WITH MEALS AND AT BEDTIME   [DISCONTINUED] cetirizine HCl (ZYRTEC) 1 MG/ML solution Take 10 mLs (10 mg total) by mouth daily.   No facility-administered encounter medications on file as of 10/08/2023.    REVIEW OF SYSTEMS  : All other systems reviewed and negative except where noted  in the History of Present Illness.   PHYSICAL EXAM: BP 98/62   Pulse (!) 105   Ht 5' (1.524 m)   Wt 92 lb (41.7 kg)   BMI 17.97 kg/m  Gen: awake, alert, NAD, sitting in mechanical wheelchair HEENT: anicteric, op clear and dry without thrush Abd: soft, NT/ND, +BS throughout, GJ tube in place in the left mid abdomen Ext: Severe contractures bilateral upper extremities, decreased muscle bulk  ASSESSMENT AND PLAN: 37 year old male with a history of cerebral palsy with quadriplegia/spasticity, severe scoliosis/kyphosis with prior spinal fusion, esophageal dysphagia and chronic GJ tube for medicine/feeding who is here for follow-up   1.  Cerebral palsy with spasticity/GJ tube dependency --overall he is doing well.  We will continue him on Zegerid for acid suppression daily.  They have him back on Carafate on a daily basis for about the past year and need that refilled.   --Continue Zegerid daily --Continue Carafate daily.  Will refill prescription.   2.  Intermittent constipation --they do not like the MiraLAX because they say it makes his stools sticky.  They give him probiotic and smooth move laxative tea.   He can follow-up every 1 to 2 years, sooner if needed.    CC:  Bradd Canary, MD

## 2023-10-18 DIAGNOSIS — F339 Major depressive disorder, recurrent, unspecified: Secondary | ICD-10-CM | POA: Diagnosis not present

## 2023-10-18 DIAGNOSIS — G4700 Insomnia, unspecified: Secondary | ICD-10-CM | POA: Diagnosis not present

## 2023-10-22 NOTE — Progress Notes (Signed)
Addendum: Reviewed and agree with assessment and management plan. Kadijah Shamoon M, MD  

## 2023-11-16 DIAGNOSIS — N1330 Unspecified hydronephrosis: Secondary | ICD-10-CM | POA: Diagnosis not present

## 2023-11-17 ENCOUNTER — Inpatient Hospital Stay (HOSPITAL_COMMUNITY): Admission: RE | Admit: 2023-11-17 | Payer: Medicare Other | Source: Ambulatory Visit

## 2023-11-17 ENCOUNTER — Other Ambulatory Visit (HOSPITAL_COMMUNITY): Payer: Self-pay | Admitting: Radiology

## 2023-11-17 DIAGNOSIS — R1310 Dysphagia, unspecified: Secondary | ICD-10-CM

## 2023-11-18 ENCOUNTER — Other Ambulatory Visit (HOSPITAL_COMMUNITY): Payer: Self-pay | Admitting: Radiology

## 2023-11-18 ENCOUNTER — Ambulatory Visit (HOSPITAL_COMMUNITY)
Admission: RE | Admit: 2023-11-18 | Discharge: 2023-11-18 | Disposition: A | Discharge status: Home / Self Care | Payer: Medicare Other | Source: Ambulatory Visit | Attending: Radiology | Admitting: Radiology

## 2023-11-18 DIAGNOSIS — Z431 Encounter for attention to gastrostomy: Secondary | ICD-10-CM | POA: Diagnosis not present

## 2023-11-18 DIAGNOSIS — K9413 Enterostomy malfunction: Secondary | ICD-10-CM | POA: Diagnosis not present

## 2023-11-18 DIAGNOSIS — R1310 Dysphagia, unspecified: Secondary | ICD-10-CM | POA: Diagnosis not present

## 2023-11-18 HISTORY — PX: IR REPLC GASTRO/COLONIC TUBE PERCUT W/FLUORO: IMG2333

## 2023-11-18 MED ORDER — LIDOCAINE HCL URETHRAL/MUCOSAL 2 % EX GEL: 1.0000 | Freq: Once | CUTANEOUS | Status: DC

## 2023-11-18 MED ORDER — LIDOCAINE VISCOUS HCL 2 % MT SOLN
OROMUCOSAL | Status: AC
  Filled 2023-11-18: qty 15

## 2023-11-18 MED ORDER — IOHEXOL 300 MG/ML  SOLN: 50.0000 mL | Freq: Once | INTRAMUSCULAR | Status: DC | PRN

## 2023-11-18 NOTE — Procedures (Signed)
Vascular and Interventional Radiology Procedure Note  Patient: [name] DOB: 1986/08/02 Medical Record Number: [id number] Note Date/Time: 11/18/23 4:18 PM   Performing Physician: Roanna Banning, MD Assistant(s): None  Diagnosis: Catheter malfunction.  Procedure: PERCUTANEOUS GASTROJEJUNOSTOMY TUBE EXCHANGE  Anesthesia: Local Anesthetic Complications: None Estimated Blood Loss: Minimal  Findings:  Successful exchange of a 39F gastrojejunostomy tube under fluoroscopy.   See detailed procedure note with images in PACS. The patient tolerated the procedure well without incident or complication and was returned to Recovery in stable condition.    Roanna Banning, MD [facility] Radiology   Pager. [phone] Clinic. [phone]

## 2023-11-21 NOTE — Assessment & Plan Note (Signed)
Using a high protein tube feeding product

## 2023-11-21 NOTE — Assessment & Plan Note (Addendum)
Permanent gastrostomy tube in place. Other manages his tube with supplies

## 2023-11-21 NOTE — Assessment & Plan Note (Signed)
They are working on finding him a permanent placement in a facility as his parents are getting older.

## 2023-11-21 NOTE — Assessment & Plan Note (Signed)
Is doing well currently

## 2023-11-23 ENCOUNTER — Encounter: Payer: Self-pay | Admitting: Family

## 2023-11-23 ENCOUNTER — Telehealth (INDEPENDENT_AMBULATORY_CARE_PROVIDER_SITE_OTHER): Payer: Medicare Other | Admitting: Family

## 2023-11-23 VITALS — HR 90 | Temp 98.8°F | Ht 60.0 in

## 2023-11-23 DIAGNOSIS — J690 Pneumonitis due to inhalation of food and vomit: Secondary | ICD-10-CM

## 2023-11-23 MED ORDER — AZITHROMYCIN 200 MG/5ML PO SUSR: ORAL | 0 refills | Status: DC

## 2023-11-23 NOTE — Progress Notes (Signed)
[name] is a 38 y.o. male with the following history as recorded in Epic[employer]:  Patient Active Problem List   Diagnosis Date Noted   Abscess of axilla, left 01/18/2023   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   Agitation 08/18/2022   Insomnia 06/19/2022   Bilateral knee pain 06/11/2022   Therapeutic drug monitoring 06/11/2022   PVD (peripheral vascular disease) (HCC) 08/10/2021   Close exposure to COVID-19 virus 10/16/2019   Mildly underweight adult 03/16/2017   Skin lesion of right ear 03/16/2017   Increased oropharyngeal secretions 04/14/2016   Medicare annual wellness visit, subsequent 08/25/2015   Hyperlipidemia, mild 08/25/2015   Recurrent aspiration pneumonia (HCC) 12/17/2014   Esophageal dysphagia 09/06/2014   Dysphagia, pharyngoesophageal phase 09/02/2014   Impetigo 09/02/2014   Abnormal thyroid function test 06/10/2014   Protein-calorie malnutrition, severe (HCC) 11/23/2013   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   Cervical dystonia 08/23/2013   Dysphagia, oropharyngeal phase 08/10/2013   Low back pain 07/11/2013   PORTAL VEIN THROMBOSIS 12/12/2010   Anemia 12/05/2010   Other constipation 12/05/2010   KYPHOSIS 11/11/2010   Infantile cerebral palsy (HCC) 08/04/2010   Thyroid disease 08/01/2010   Depression with anxiety 08/01/2010   GERD 08/01/2010   PALPITATIONS 02/25/2009   HIP PAIN, BILATERAL 11/06/2008   Benign neoplasm of skin 10/09/2008   Dysuria 10/09/2008   Incontinence of feces 04/02/2008   Urinary incontinence 04/02/2008   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 20 mL 12   AMBULATORY NON FORMULARY MEDICATION Medication Name: MIC gastrostomy/bolus feeding tube 24 French Part number  0110-24. #2 and 10 cc lurer lock syringe #2 Dx: 4 Device 2   dantrolene (DANTRIUM) 50 MG capsule TAKE 1 CAPSULE BY MOUTH EVERY MORNING, 1 CAPSULE EVERY EVENING, AND 1 CAPSULE AT BEDTIME. 90 capsule 6   DEPAKOTE SPRINKLES 125 MG capsule Take by mouth.     diazepam (VALIUM) 5 MG tablet Take 5 mg by mouth 2 (two) times daily.     doxepin (SINEQUAN) 25 MG capsule Take 25 mg by mouth at bedtime.     escitalopram (LEXAPRO) 5 MG/5ML solution Place 20 mLs into feeding tube daily.     Incontinence Supply Disposable (PREVAIL BREEZERS MEDIUM) MISC pkg of 16- size medium 32" to 44"  Breathable cloth-like outer fabric (can't use the plastic outer surgace  Item # PVB-012/2 16 each 6   ketotifen (ZADITOR) 0.035 % ophthalmic solution Place 1 drop into both eyes in the morning and at bedtime. 5 mL 0   lamoTRIgine (LAMICTAL) 25 MG tablet TAKE 2 TABLETS BY MOUTH 2 TIMES DAILY. 120 tablet 6   LORazepam (ATIVAN) 1 MG tablet Take 1 mg by mouth at bedtime.     Misc. Devices (ALL-BODY MASSAGE) MISC 1 Units/hr by Does not apply route as needed. Full body massage for Muscle spasticity due to Cerebral palsy 99 each 99   mupirocin ointment (BACTROBAN) 2 % Apply to area thin film twice daily if needed 22 g 0   NON FORMULARY Bard Leg Bag Extension tubing w/Connector 18", Sterile, latex-free  Item# [id number]     NON FORMULARY Colorplast Freedom Cath Latex Self-Adhering Male External Catheter 31mm Diameter Intermediate  Item# [id number]     NONFORMULARY OR COMPOUNDED ITEM [employer] REF [id number] -  Kangaroo Joey Pump Set with Flush Bags - 1000 mL 1 each 0   Nutritional Supplements (FEEDING SUPPLEMENT, KATE FARMS STANDARD 1.4,) LIQD liquid Take 325 mLs by mouth as directed. 3 carton over 16 hrs     OLANZapine (ZYPREXA) 10 MG tablet Take 10 mg by mouth at bedtime.     OLANZapine (ZYPREXA) 5 MG tablet Take by mouth.     Omeprazole-Sodium Bicarbonate (ZEGERID) 20-1100 MG CAPS capsule Take 1 capsule by mouth daily before breakfast.      Ostomy Supplies (PROTECTIVE BARRIER WIPES) MISC 1-1/4" X 3"  Item #[id number] 75 each 6   oxyBUTYnin (DITROPAN) 5 MG/5ML solution Take 5 mg by mouth 2 (two) times daily.     sodium chloride HYPERTONIC 3 % nebulizer solution Take by nebulization in the morning and at bedtime. 750 mL 12   sucralfate (CARAFATE) 1 g tablet Take 1 tablet (1 g total) by mouth daily. TAKE 1 TABLET BY MOUTH 4 TIMES DAILY WITH MEALS AND AT BEDTIME 90 tablet 3   traZODone (DESYREL) 50 MG tablet Take 1 tablet (50 mg total) by mouth at bedtime. (Patient taking differently: Take 150 mg by mouth at bedtime.)     azithromycin (ZITHROMAX) 200 MG/5ML suspension 12.5 ml po day 1 then 6 ml po x 4 days 22.5 mL 0   No current facility-administered medications for this visit.    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   GERD (gastroesophageal reflux disease)    Hyperlipidemia, mild 08/25/2015   Hyperthyroidism    Incontinence of feces    Loss of weight 08/28/2014   Medicare annual wellness visit, subsequent 08/25/2015   Mildly underweight adult 03/16/2017   Palpitations    PALSY, INFANTILE CEREBRAL, 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   Qualifier: Diagnosis of  By: Janit Bern  Working with PMR Dr Hermelinda Medicus and tolerating Botox injections in hips, shoulders, etc with good results   Skin lesion of right ear 03/16/2017   Thyroid disease 08/01/2010   Qualifier: Diagnosis of  By: Candice Camp CMA (AAMA), Dottie      Past Surgical History:  Procedure Laterality Date   baclofen  trial     baslofen pump implant     ears tubes     EYE SURGERY     FLEXIBLE SIGMOIDOSCOPY N/A 09/07/2014   Procedure: FLEXIBLE SIGMOIDOSCOPY;  Surgeon: Beverley Fiedler, MD;  Location: [facility] ENDOSCOPY;  Service: Endoscopy;  Laterality: N/A;   g-tube insert  August 2006   hamstring released     to treat contractures.    HIP SURGERY     x2 , side    IR CM INJ ANY COLONIC TUBE W/FLUORO  05/28/2017   IR CM INJ ANY COLONIC TUBE W/FLUORO  07/07/2019   IR CM INJ ANY COLONIC TUBE W/FLUORO  09/18/2019   IR CM INJ ANY COLONIC TUBE W/FLUORO  05/21/2023   IR GASTR TUBE CONVERT GASTR-JEJ PER W/FL MOD SED  03/14/2019   IR GENERIC HISTORICAL  07/01/2016   IR GASTR TUBE CONVERT GASTR-JEJ PER W/FL MOD SED 07/01/2016 Irish Lack, MD [facility]   IR GENERIC HISTORICAL  07/08/2016   IR  PATIENT EVAL TECH 0-60 MINS 07/08/2016 Irish Lack, MD [facility]   IR GENERIC HISTORICAL  07/14/2016   IR GJ TUBE CHANGE 07/14/2016 Simonne Come, MD [facility]   IR GENERIC HISTORICAL  07/21/2016   IR PATIENT EVAL TECH 0-60 MINS [facility]   IR GENERIC HISTORICAL  08/31/2016   IR REPLC DUODEN/JEJUNO TUBE PERCUT W/FLUORO 08/31/2016 Berdine Dance, MD [facility]   IR GENERIC HISTORICAL  09/03/2016   IR GJ TUBE CHANGE 09/03/2016 Simonne Come, MD [facility]   IR GENERIC HISTORICAL  09/10/2016   IR GASTR TUBE CONVERT GASTR-JEJ PER W/FL MOD SED 09/10/2016 [facility]   IR GENERIC HISTORICAL  09/16/2016   IR PATIENT EVAL TECH 0-60 MINS [facility]   IR GENERIC HISTORICAL  09/29/2016   IR GJ TUBE CHANGE 09/29/2016 Oley Balm, MD [facility]   IR GJ TUBE CHANGE  02/05/2017   IR GJ TUBE CHANGE  05/21/2017   IR GJ TUBE CHANGE  08/25/2017   IR GJ TUBE CHANGE  01/18/2018   IR GJ TUBE CHANGE  02/04/2018   IR GJ TUBE CHANGE  04/21/2018   IR GJ TUBE CHANGE  08/18/2018   IR GJ TUBE CHANGE  09/12/2019   IR GJ TUBE CHANGE  01/03/2020   IR GJ TUBE CHANGE  05/13/2020   IR GJ TUBE CHANGE  09/11/2020   IR GJ TUBE CHANGE  02/27/2021   IR  GJ TUBE CHANGE  08/01/2021   IR GJ TUBE CHANGE  12/31/2021   IR GJ TUBE CHANGE  05/11/2022   IR GJ TUBE CHANGE  07/01/2022   IR GJ TUBE CHANGE  01/28/2023   IR MECH REMOV OBSTRUC MAT ANY COLON TUBE W/FLUORO  09/02/2018   IR REPLC GASTRO/COLONIC TUBE PERCUT W/FLUORO  10/17/2018   IR REPLC GASTRO/COLONIC TUBE PERCUT W/FLUORO  04/02/2023   IR REPLC GASTRO/COLONIC TUBE PERCUT W/FLUORO  11/18/2023   PEG PLACEMENT  10/21/2011   Procedure: PERCUTANEOUS ENDOSCOPIC GASTROSTOMY (PEG) REPLACEMENT;  Surgeon: Hart Carwin, MD;  Location: [facility] ENDOSCOPY;  Service: Endoscopy;  Laterality: N/A;   PEG PLACEMENT N/A 06/13/2013   Procedure: PERCUTANEOUS ENDOSCOPIC GASTROSTOMY (PEG) REPLACEMENT;  Surgeon: Hart Carwin, MD;  Location: [facility];  Service: Endoscopy;  Laterality: N/A;   SPINAL FUSION     spinal fusion to correct 70 degree kyphosis  11-2010   spinal fusioncorrect 106 degree kyphosis     SPINE SURGERY  ,11/20/2010, 2011   for correction of severe contracturing spinal kyphosis.    TONSILLECTOMY      Family History  Problem Relation Age of Onset   Asthma Mother    Hyperlipidemia Mother    COPD Mother    Other Mother        bronchial stasis/ABPA   Cancer Maternal Grandmother 45       breast   Hyperlipidemia Maternal Grandmother    Hypertension Maternal Grandmother    Cancer Maternal Grandfather        prostate   Heart disease Paternal Grandfather        CHF   Osteoporosis Paternal Grandmother    Arthritis Paternal Grandmother        rheumatoid    Social History   Tobacco Use   Smoking status: Never   Smokeless tobacco: Never  Substance Use Topics   Alcohol use: No    Subjective:    I connected with [name] on 11/23/23 at  1:20 PM EST by a video enabled telemedicine application and verified that I  am speaking with the correct person using two identifiers.   I discussed the limitations of evaluation and management by telemedicine and the availability of in person appointments.  The patient expressed understanding and agreed to proceed. Provider in office/ patient is at home; provider and patient are only 2 people on video call.   Mother provides history; notes that started with cough/ congestion/ low grade fever over a week ago; gave him a breathing treatment and did seem to respond initially; per mother, he had been doing well all week until yesterday afternoon; notes that "he really got into distress yesterday." Notes that fever yesterday was up to 104 yesterday and 02 sats were at 91-92; per mother, she has been using Ibuprofen and does feel that improved today; mother is concerned about possible aspiration pneumonia;  Unfortunately transportation issues prevented patient from being able to be seen in the office in person today;    Objective:  Vitals:   11/23/23 1319  Pulse: 90  Temp: 98.8 F (37.1 C)  TempSrc: Oral  SpO2: 97%  Height: 5' (1.524 m)    General: Well developed, well nourished, in no acute distress  Skin : Warm and dry.  Head: Normocephalic and atraumatic  Lungs: Respirations unlabored;   Assessment:  1. Recurrent aspiration pneumonia (HCC)     Plan:  Will go ahead and start liquid Zithromax; patient has in person appointment with his PCP later this week and will keep this appointment; strict ER precautions discussed; increase fluids, rest and follow up with Dr. Abner Greenspan on Thursday.     No follow-ups on file.  No orders of the defined types were placed in this encounter.   Requested Prescriptions   Signed Prescriptions Disp Refills   azithromycin (ZITHROMAX) 200 MG/5ML suspension 22.5 mL 0    Sig: 12.5 ml po day 1 then 6 ml po x 4 days

## 2023-11-25 ENCOUNTER — Ambulatory Visit (INDEPENDENT_AMBULATORY_CARE_PROVIDER_SITE_OTHER): Payer: Medicare Other | Admitting: Family Medicine

## 2023-11-25 VITALS — BP 108/65 | HR 98 | Temp 98.2°F | Resp 18 | Ht 60.0 in | Wt 95.0 lb

## 2023-11-25 DIAGNOSIS — Z7985 Long-term (current) use of injectable non-insulin antidiabetic drugs: Secondary | ICD-10-CM

## 2023-11-25 DIAGNOSIS — J690 Pneumonitis due to inhalation of food and vomit: Secondary | ICD-10-CM

## 2023-11-25 DIAGNOSIS — J189 Pneumonia, unspecified organism: Secondary | ICD-10-CM | POA: Diagnosis not present

## 2023-11-25 DIAGNOSIS — R1319 Other dysphagia: Secondary | ICD-10-CM

## 2023-11-25 DIAGNOSIS — E43 Unspecified severe protein-calorie malnutrition: Secondary | ICD-10-CM

## 2023-11-25 DIAGNOSIS — G800 Spastic quadriplegic cerebral palsy: Secondary | ICD-10-CM

## 2023-11-25 MED ORDER — CEFPODOXIME PROXETIL 100 MG/5ML PO SUSR
100.0000 mg | Freq: Three times a day (TID) | ORAL | 0 refills | Status: DC
Start: 2023-11-25 — End: 2024-01-20

## 2023-11-25 NOTE — Patient Instructions (Addendum)
Tetanus booster at pharmacy  Covid 19 booster annually  Aspiration Pneumonia, Adult  Aspiration pneumonia is an infection that occurs after you breathe a large amount of food, liquid, stomach acid, or saliva into your lungs (pulmonary aspiration). This can cause inflammation and infection in the lungs. It can also make you cough and make it hard to breathe. This condition is serious and can be life-threatening. What are the causes? This condition may be caused by: Breathing the bacteria in food, liquid, stomach acid, or saliva into your lungs. Breathing material such as blood or a foreign body into your lungs. This can cause an infection even if the material does not originally have bacteria on it. What increases the risk? You are more likely to get aspiration pneumonia if you have a condition that makes it hard to breathe, swallow, cough, or gag, such as: A breathing disorder. This includes chronic obstructive pulmonary disease (COPD). A brain (neurologic) disorder. This may be a stroke, seizures, Parkinson's disease, dementia, amyotrophic lateral sclerosis (ALS), or a brain injury. Gastroesophageal reflux disease (GERD). Esophageal narrowing. This is a narrowing of the tube that carries food to the stomach. You may also be more likely to get this condition if: You are older than age 8 and frail. You are given anesthesia for a procedure. You drink too much alcohol and pass out. If you pass out and vomit, the vomit can be inhaled into your lungs. You take medicines to help you sleep. These include tranquilizers and sedatives. You take poor care of your mouth and teeth. You do not get enough nutrients (are malnourished). You have a weak disease-fighting system (immune system). What are the signs or symptoms? The main sign of this condition is an episode of choking or coughing while eating or drinking. Other symptoms may include: A cough that does not go away. Trouble breathing. This may  include shortness of breath. It may also involve high-pitched whistling sounds when you breathe, most often when you breathe out (wheezing). Fever. Chest pain. Being more tired than usual (fatigue). This condition may also be silent. This means that you may not have any symptoms. How is this diagnosed? This condition may be diagnosed based on a physical exam. You may also have tests, such as: Blood tests. Chest X-ray. Sputum culture. This is when mucus (sputum) is taken from the lungs or from the tubes that carry air to the lungs (bronchi). The sputum is tested for bacteria. Oximetry. This is when a sensor or clip is put on an area such as a finger, earlobe, or toe. It measures the level of oxygen in your blood. Swallowing study. This test looks at how food is swallowed. It is done to see whether food goes into your windpipe (trachea) or esophagus. Bronchoscopy. This test uses a flexible tube (bronchoscope) to see inside your lungs. How is this treated? This condition may be treated with: Medicines. Antibiotics will be given to kill the bacteria that are causing the pneumonia. Other medicines may also be used to reduce fever, pain, or inflammation. Breathing assistance and oxygen therapy. You may need to be given oxygen, or you may need breathing support from a machine (ventilator). Thoracentesis. This is a procedure to remove fluid that has built up in the space between the linings of the chest wall and the lungs. Changes in diet. If you get this condition often, you may need to have a feeding tube placed. This can help give you the nutrients you need. Follow these instructions  at home: Medicines Take over-the-counter and prescription medicines as told by your health care provider. Finish your antibiotics even if you start to feel better. Take cough medicine only if you are losing sleep. Cough medicine can stop your body from being able to remove mucus from your lungs. General  instructions Follow instructions from your health care provider about what you may eat and drink. You may need to: Avoid certain food textures. Thicken your liquids. This can lower your risk of getting this condition again. Sleep in a semi-upright position at night. Try to sleep in a reclining chair. You may also place a few pillows under your head in bed. Do not use any products that contain nicotine or tobacco. These products include cigarettes, chewing tobacco, and vaping devices, such as e-cigarettes. If you need help quitting, ask your health care provider. Return to your normal activities as told by your health care provider. Ask your health care provider what activities are safe for you. Keep all follow-up visits. Your health care provider may need to see how you are healing. Contact a health care provider if: You have a fever. You cough or choke when you eat or drink. You keep having signs or symptoms of this condition. Get help right away if: You have trouble breathing that gets worse. You have chest pain. These symptoms may be an emergency. Get help right away. Call 911. Do not wait to see if symptoms will go away. Do not drive yourself to the hospital. This information is not intended to replace advice given to you by your health care provider. Make sure you discuss any questions you have with your health care provider. Document Revised: 04/01/2022 Document Reviewed: 04/01/2022 Elsevier Patient Education  2024 [employer].

## 2023-11-27 ENCOUNTER — Encounter: Payer: Self-pay | Admitting: Family Medicine

## 2023-11-27 NOTE — Progress Notes (Signed)
Subjective:    Patient ID: [name], male    DOB: 08/11/1986, 38 y.o.   MRN: [id number]  Chief Complaint  Patient presents with  . Follow-up    3 month    HPI Discussed the use of AI scribe software for clinical note transcription with the patient, who gave verbal consent to proceed.  History of Present Illness   The patient, with a history of hypertension, has been experiencing a respiratory infection for the past week. The infection started with a fever and difficulty breathing, which was managed at home with breathing treatments. The fever resolved the next day, but the patient continued to have a cough and was not feeling like he usual self. The patient's oxygen levels dropped to 91-92 and he had a fever of 104.2, which was managed with acetaminophen. The patient was started on azithromycin by his primary care provider and has been improving since. However, the patient has been experiencing diarrhea, which the caregiver attributes to the azithromycin. The patient also recently had his PEG tube changed and the caregiver reports no issues with the new tube. The patient's caregiver also reports that the patient recently saw a cardiologist who was happy with the patient's test results and does not need to see the patient for another year. The patient is also on additional blood pressure medication, which the caregiver believes is helping to control the patient's blood pressure.        Past Medical History:  Diagnosis Date  . Cerebral palsy (HCC)   . Dehydration 11/22/2013  . Depression with anxiety 08/01/2010   Qualifier: Diagnosis of  By: Nelson-Smith CMA (AAMA), Dottie    . Dyslipidemia 08/19/2017  . Esophagitis 2011  . Gastrostomy in place [facility]) 08/31/2013  . GERD (gastroesophageal reflux disease)   . Hyperlipidemia, mild 08/25/2015  . Hyperthyroidism   . Incontinence of feces   . Loss of weight 08/28/2014  . Medicare annual wellness visit, subsequent 08/25/2015  . Mildly  underweight adult 03/16/2017  . Palpitations   . 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 Skin lesion of right ear 03/16/2017  . Thyroid disease 08/01/2010   Qualifier: Diagnosis of  By: Candice Camp CMA (AAMA), Dottie      Past Surgical History:  Procedure Laterality Date  . baclofen trial    . baslofen pump implant    . ears tubes    . EYE SURGERY    . FLEXIBLE SIGMOIDOSCOPY N/A 09/07/2014   Procedure: FLEXIBLE SIGMOIDOSCOPY;  Surgeon: Beverley Fiedler, MD;  Location: [facility] ENDOSCOPY;  Service: Endoscopy;  Laterality: N/A;  . g-tube insert  August 2006  . hamstring released     to treat contractures.   Marland Kitchen HIP SURGERY     x2 , side   . IR CM INJ ANY COLONIC TUBE W/FLUORO  05/28/2017  . IR CM INJ ANY COLONIC TUBE W/FLUORO  07/07/2019  . IR CM INJ ANY COLONIC TUBE W/FLUORO  09/18/2019  . IR CM INJ ANY COLONIC TUBE W/FLUORO  05/21/2023  . IR GASTR TUBE CONVERT GASTR-JEJ PER W/FL MOD SED  03/14/2019  . IR GENERIC HISTORICAL  07/01/2016   IR GASTR TUBE CONVERT GASTR-JEJ PER W/FL MOD SED 07/01/2016 Irish Lack, MD [facility]  . IR GENERIC HISTORICAL  07/08/2016   IR PATIENT EVAL TECH 0-60 MINS 07/08/2016 Irish Lack,  MD [facility]  . IR GENERIC HISTORICAL  07/14/2016   IR GJ TUBE CHANGE 07/14/2016 Simonne Come, MD [facility]  . IR GENERIC HISTORICAL  07/21/2016   IR PATIENT EVAL TECH 0-60 MINS [facility]  . IR GENERIC HISTORICAL  08/31/2016   IR REPLC DUODEN/JEJUNO TUBE PERCUT W/FLUORO 08/31/2016 Berdine Dance, MD [facility]  . IR GENERIC HISTORICAL  09/03/2016   IR GJ TUBE CHANGE 09/03/2016 Simonne Come, MD [facility]  . IR GENERIC HISTORICAL  09/10/2016   IR GASTR TUBE CONVERT GASTR-JEJ PER W/FL MOD SED 09/10/2016 [facility]  . IR GENERIC HISTORICAL  09/16/2016   IR PATIENT EVAL TECH 0-60 MINS [facility]  . IR GENERIC  HISTORICAL  09/29/2016   IR GJ TUBE CHANGE 09/29/2016 Oley Balm, MD [facility]  . IR GJ TUBE CHANGE  02/05/2017  . IR GJ TUBE CHANGE  05/21/2017  . IR GJ TUBE CHANGE  08/25/2017  . IR GJ TUBE CHANGE  01/18/2018  . IR GJ TUBE CHANGE  02/04/2018  . IR GJ TUBE CHANGE  04/21/2018  . IR GJ TUBE CHANGE  08/18/2018  . IR GJ TUBE CHANGE  09/12/2019  . IR GJ TUBE CHANGE  01/03/2020  . IR GJ TUBE CHANGE  05/13/2020  . IR GJ TUBE CHANGE  09/11/2020  . IR GJ TUBE CHANGE  02/27/2021  . IR GJ TUBE CHANGE  08/01/2021  . IR GJ TUBE CHANGE  12/31/2021  . IR GJ TUBE CHANGE  05/11/2022  . IR GJ TUBE CHANGE  07/01/2022  . IR GJ TUBE CHANGE  01/28/2023  . IR MECH REMOV OBSTRUC MAT ANY COLON TUBE W/FLUORO  09/02/2018  . IR REPLC GASTRO/COLONIC TUBE PERCUT W/FLUORO  10/17/2018  . IR REPLC GASTRO/COLONIC TUBE PERCUT W/FLUORO  04/02/2023  . IR REPLC GASTRO/COLONIC TUBE PERCUT W/FLUORO  11/18/2023  . PEG PLACEMENT  10/21/2011   Procedure: PERCUTANEOUS ENDOSCOPIC GASTROSTOMY (PEG) REPLACEMENT;  Surgeon: Hart Carwin, MD;  Location: [facility]OS[facility];  Service: Endoscopy;  Laterality: N/A;  . PEG PLACEMENT N/A 06/13/2013   Procedure: PERCUTANEOUS ENDOSCOPIC GASTROSTOMY (PEG) REPLACEMENT;  Surgeon: Hart Carwin, MD;  Location: [facility] ENDOSCOPY;  Service: Endoscopy;  Laterality: N/A;  . SPINAL FUSION    . spinal fusion to correct 70 degree kyphosis  11-2010  . spinal fusioncorrect 106 degree kyphosis    . SPINE SURGERY  ,11/20/2010, 2011   for correction of severe contracturing spinal kyphosis.   . TONSILLECTOMY      Family History  Problem Relation Age of Onset  . Asthma Mother   . Hyperlipidemia Mother   . COPD Mother   . Other Mother        bronchial stasis/ABPA  . Cancer Maternal Grandmother 25       breast  . Hyperlipidemia Maternal Grandmother   . Hypertension Maternal Grandmother   . Cancer Maternal Grandfather        prostate  . Heart disease Paternal Grandfather        CHF  . Osteoporosis Paternal Grandmother    . Arthritis Paternal Grandmother        rheumatoid    Social History   Socioeconomic History  . Marital status: Single    Spouse name: Not on file  . Number of children: 0  . Years of education: Not on file  . Highest education level: Not on file  Occupational History  . Occupation: disbaled  Tobacco Use  . Smoking status: Never  . Smokeless tobacco: Never  Vaping Use  . Vaping status: Never Used  Substance and Sexual Activity  . Alcohol use: No  . Drug use: No  . Sexual activity: Never  Other Topics Concern  . Not on file  Social History Narrative  . Not on file   Social Drivers of Health   Financial Resource Strain: Low Risk  (10/05/2023)   Overall Financial Resource Strain (CARDIA)   . Difficulty of Paying Living Expenses: Not hard at all  Food Insecurity: No Food Insecurity (10/05/2023)   Hunger Vital Sign   . Worried About Programme researcher, broadcasting/film/video in the Last Year: Never true   . Ran Out of Food in the Last Year: Never true  Transportation Needs: No Transportation Needs (10/05/2023)   PRAPARE - Transportation   . Lack of Transportation (Medical): No   . Lack of Transportation (Non-Medical): No  Physical Activity: Inactive (10/05/2023)   Exercise Vital Sign   . Days of Exercise per Week: 0 days   . Minutes of Exercise per Session: 0 min  Stress: No Stress Concern Present (10/05/2023)   [employer] of Occupational Health - Occupational Stress Questionnaire   . Feeling of Stress : Not at all  Social Connections: Moderately Integrated (10/05/2023)   Social Connection and Isolation Panel [NHANES]   . Frequency of Communication with Friends and Family: More than three times a week   . Frequency of Social Gatherings with Friends and Family: More than three times a week   . Attends Religious Services: More than 4 times per year   . Active Member of Clubs or Organizations: Yes   . Attends Banker Meetings: More than 4 times per year   . Marital Status:  Never married  Intimate Partner Violence: Not At Risk (10/05/2023)   Humiliation, Afraid, Rape, and Kick questionnaire   . Fear of Current or Ex-Partner: No   . Emotionally Abused: No   . Physically Abused: No   . Sexually Abused: No    Outpatient Medications Prior to Visit  Medication Sig Dispense Refill  . albuterol (PROVENTIL) (5 MG/ML) 0.5% nebulizer solution Take 0.5 mLs (2.5 mg total) by nebulization every 6 (six) hours as needed for wheezing or shortness of breath. 20 mL 12  . AMBULATORY NON FORMULARY MEDICATION Medication Name: MIC gastrostomy/bolus feeding tube 24 French Part number 0110-24. #2 and 10 cc lurer lock syringe #2 Dx: 4 Device 2  . azithromycin (ZITHROMAX) 200 MG/5ML suspension 12.5 ml po day 1 then 6 ml po x 4 days 22.5 mL 0  . dantrolene (DANTRIUM) 50 MG capsule TAKE 1 CAPSULE BY MOUTH EVERY MORNING, 1 CAPSULE EVERY EVENING, AND 1 CAPSULE AT BEDTIME. 90 capsule 6  . DEPAKOTE SPRINKLES 125 MG capsule Take by mouth.    . diazepam (VALIUM) 5 MG tablet Take 5 mg by mouth 2 (two) times daily.    Marland Kitchen doxepin (SINEQUAN) 25 MG capsule Take 25 mg by mouth at bedtime.    Marland Kitchen escitalopram (LEXAPRO) 5 MG/5ML solution Place 20 mLs into feeding tube daily.    . Incontinence Supply Disposable (PREVAIL BREEZERS MEDIUM) MISC pkg of 16- size medium 32" to 44"  Breathable cloth-like outer fabric (can't use the plastic outer surgace  Item # [id number] 16 each 6  . ketotifen (ZADITOR) 0.035 % ophthalmic solution Place 1 drop into both eyes in the morning and at bedtime. 5 mL 0  . lamoTRIgine (LAMICTAL) 25 MG tablet TAKE 2 TABLETS BY MOUTH 2 TIMES DAILY. 120 tablet 6  .  LORazepam (ATIVAN) 1 MG tablet Take 1 mg by mouth at bedtime.    . Misc. Devices (ALL-BODY MASSAGE) MISC 1 Units/hr by Does not apply route as needed. Full body massage for Muscle spasticity due to Cerebral palsy 99 each 99  . mupirocin ointment (BACTROBAN) 2 % Apply to area thin film twice daily if needed 22 g 0  . NON  FORMULARY Bard Leg Bag Extension tubing w/Connector 18", Sterile, latex-free  Item# [id number]    . NON FORMULARY Colorplast Freedom Cath Latex Self-Adhering Male External Catheter 31mm Diameter Intermediate  Item# [id number]    . NONFORMULARY OR COMPOUNDED ITEM Covidien REF [id number] - Kangaroo Joey Pump Set with Flush Bags - 1000 mL 1 each 0  . Nutritional Supplements (FEEDING SUPPLEMENT, KATE FARMS STANDARD 1.4,) LIQD liquid Take 325 mLs by mouth as directed. 3 carton over 16 hrs    . OLANZapine (ZYPREXA) 10 MG tablet Take 10 mg by mouth at bedtime.    Marland Kitchen OLANZapine (ZYPREXA) 5 MG tablet Take by mouth.    Maxwell Caul Bicarbonate (ZEGERID) 20-1100 MG CAPS capsule Take 1 capsule by mouth daily before breakfast.    . Ostomy Supplies (PROTECTIVE BARRIER WIPES) MISC 1-1/4" X 3"  Item #[id number] 75 each 6  . oxyBUTYnin (DITROPAN) 5 MG/5ML solution Take 5 mg by mouth 2 (two) times daily.    . sodium chloride HYPERTONIC 3 % nebulizer solution Take by nebulization in the morning and at bedtime. 750 mL 12  . sucralfate (CARAFATE) 1 g tablet Take 1 tablet (1 g total) by mouth daily. TAKE 1 TABLET BY MOUTH 4 TIMES DAILY WITH MEALS AND AT BEDTIME 90 tablet 3  . traZODone (DESYREL) 50 MG tablet Take 1 tablet (50 mg total) by mouth at bedtime. (Patient taking differently: Take 150 mg by mouth at bedtime.)     No facility-administered medications prior to visit.    Allergies  Allergen Reactions  . Ambien [Zolpidem Tartrate] Nausea Only  . Antihistamines, Chlorpheniramine-Type     Other reaction(s): Other (See Comments) Other Reaction: agitation  . Augmentin [Amoxicillin-Pot Clavulanate] Diarrhea  . Cefdinir Diarrhea    Not tolerate well and cause diarrhea  . Codeine Other (See Comments)    Makes patient too active after a few days.  . Zolpidem     Other Reaction(s): GI Intolerance  . Baclofen Anxiety and Swelling    anxiety  . Metoclopramide Other (See Comments) and Swelling    Delusion,  emotionality  Other Reaction(s): Mental Status Changes  . Pheniramine Rash    Other reaction(s): Other (See Comments)  Other Reaction: agitation  Other Reaction(s): Other (See Comments)  Other reaction(s): Other (See Comments), Other Reaction: agitation  . Sulfa Antibiotics Rash  . Sulfonamide Derivatives Rash    Review of Systems  Constitutional:  Positive for fever and malaise/fatigue.  HENT:  Positive for congestion.   Eyes:  Negative for blurred vision.  Respiratory:  Positive for cough. Negative for shortness of breath.   Cardiovascular:  Negative for chest pain, palpitations and leg swelling.  Gastrointestinal:  Negative for abdominal pain, blood in stool and nausea.  Genitourinary:  Negative for dysuria and frequency.  Musculoskeletal:  Negative for falls.  Skin:  Negative for rash.  Neurological:  Negative for dizziness, loss of consciousness and headaches.  Endo/Heme/Allergies:  Negative for environmental allergies.  Psychiatric/Behavioral:  Negative for depression. The patient is nervous/anxious.       Objective:    Physical Exam Vitals reviewed.  Constitutional:  Appearance: Normal appearance. He is not ill-appearing.  HENT:     Head: Normocephalic and atraumatic.     Nose: Nose normal.  Eyes:     Conjunctiva/sclera: Conjunctivae normal.  Cardiovascular:     Rate and Rhythm: Normal rate.     Pulses: Normal pulses.     Heart sounds: Normal heart sounds. No murmur heard. Pulmonary:     Effort: Pulmonary effort is normal.     Breath sounds: Normal breath sounds. No wheezing.  Abdominal:     General: Bowel sounds are normal.     Palpations: Abdomen is soft. There is no mass.     Tenderness: There is no abdominal tenderness.     Comments: G tube LUQ no surrounding fluctuance orerythema  Musculoskeletal:     Cervical back: Normal range of motion.     Right lower leg: No edema.     Left lower leg: No edema.  Skin:    General: Skin is warm and dry.   Neurological:     Mental Status: He is alert and oriented to person, place, and time.     Gait: Gait abnormal.     Deep Tendon Reflexes: Reflexes abnormal.     Comments: Spastic quadriplegia with contractures.  Psychiatric:        Mood and Affect: Mood normal.   BP 108/65 (BP Location: Right Arm, Patient Position: Sitting, Cuff Size: Normal)   Pulse 98   Temp 98.2 F (36.8 C) (Axillary)   Resp 18   Ht 5' (1.524 m) Comment: Family stated  Wt 95 lb (43.1 kg) Comment: Family stated  SpO2 96%   BMI 18.55 kg/m  Wt Readings from Last 3 Encounters:  11/25/23 95 lb (43.1 kg)  10/08/23 92 lb (41.7 kg)  10/05/23 92 lb (41.7 kg)    Diabetic Foot Exam - Simple   No data filed    Lab Results  Component Value Date   WBC 4.6 08/06/2023   HGB 14.3 08/06/2023   HCT 41.6 08/06/2023   PLT 175 08/06/2023   GLUCOSE 102 (H) 08/06/2023   CHOL 140 04/26/2023   TRIG 61.0 04/26/2023   HDL 43.50 04/26/2023   LDLCALC 84 04/26/2023   ALT 12 04/26/2023   AST 15 04/26/2023   NA 136 08/06/2023   K 4.1 08/06/2023   CL 98 08/06/2023   CREATININE 0.31 (L) 08/06/2023   BUN 9 08/06/2023   CO2 29 08/06/2023   TSH 3.27 04/26/2023   HGBA1C 5.0 04/29/2007    Lab Results  Component Value Date   TSH 3.27 04/26/2023   Lab Results  Component Value Date   WBC 4.6 08/06/2023   HGB 14.3 08/06/2023   HCT 41.6 08/06/2023   MCV 92.9 08/06/2023   PLT 175 08/06/2023   Lab Results  Component Value Date   NA 136 08/06/2023   K 4.1 08/06/2023   CO2 29 08/06/2023   GLUCOSE 102 (H) 08/06/2023   BUN 9 08/06/2023   CREATININE 0.31 (L) 08/06/2023   BILITOT 0.3 04/26/2023   ALKPHOS 66 04/26/2023   AST 15 04/26/2023   ALT 12 04/26/2023   PROT 6.9 04/26/2023   ALBUMIN 4.4 04/26/2023   CALCIUM 9.1 08/06/2023   ANIONGAP 9 08/06/2023   GFR 156.97 04/26/2023   Lab Results  Component Value Date   CHOL 140 04/26/2023   Lab Results  Component Value Date   HDL 43.50 04/26/2023   Lab Results   Component Value Date   LDLCALC 84 04/26/2023  Lab Results  Component Value Date   TRIG 61.0 04/26/2023   Lab Results  Component Value Date   CHOLHDL 3 04/26/2023   Lab Results  Component Value Date   HGBA1C 5.0 04/29/2007       Assessment & Plan:  Recurrent aspiration pneumonia [facility]) Assessment & Plan: Is doing well currently   Esophageal dysphagia Assessment & Plan: Permanent gastrostomy tube in place. 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    Protein-calorie malnutrition, severe (HCC) Assessment & Plan: Using a high protein tube feeding product   Pneumonia of right lower lobe due to infectious organism -     Cefpodoxime Proxetil; Take 5 mLs (100 mg total) by mouth in the morning, at noon, and at bedtime for 10 days.  Dispense: 150 mL; Refill: 0    Assessment and Plan    Respiratory Infection Recent onset of fever and cough, with decreased oxygen saturation. Fever managed with acetaminophen. Currently on azithromycin with some improvement noted. -Complete course of azithromycin. -Monitor for any worsening symptoms and consider alternative antibiotics if needed.  Gastrointestinal Distress Recent change in bowel habits with diarrhea reported. No clear cause identified. -Monitor symptoms and consider alternative causes if diarrhea persists.  Hypertension Managed with Lisinopril 10mg  twice daily and Amlodipine 5mg  daily. Recent cardiology assessment reported as satisfactory. -Continue current medication regimen.  Diabetes A1c at 6.1, indicating good control. Currently on Trulicity. -Continue Trulicity and maintain current diet and lifestyle modifications.  General Health Maintenance -Consider tetanus booster, COVID-19 booster, and annual flu shot at local pharmacy.         Danise Edge,  MD

## 2023-11-29 ENCOUNTER — Encounter: Payer: Self-pay | Admitting: Family Medicine

## 2023-12-02 DIAGNOSIS — F339 Major depressive disorder, recurrent, unspecified: Secondary | ICD-10-CM | POA: Diagnosis not present

## 2023-12-02 DIAGNOSIS — G4700 Insomnia, unspecified: Secondary | ICD-10-CM | POA: Diagnosis not present

## 2023-12-07 ENCOUNTER — Telehealth: Payer: Self-pay | Admitting: Family Medicine

## 2023-12-07 NOTE — Telephone Encounter (Signed)
Called and spoke with Ms [name]. She was informed that we received the paperwork from the facility. It has been signed and sent back via email scan. This CMA called the facility to confirm paperwork was received.

## 2023-12-07 NOTE — Telephone Encounter (Signed)
 Copied from CRM [id number]. Topic: General - Other >> Dec 07, 2023 12:53 PM Leotis ORN wrote: Reason for CRM: patients mother rock, calling in to confirm if fax was received  from [facility] resources for a patient lift chair and other DME parts for a shower chair,   linda stated the patient is being admitted to a home 01/01/24 and she does not want the paperwork to fall behind. Advised patient that once the paperwork is received they will reach out to her

## 2023-12-13 ENCOUNTER — Telehealth: Payer: Self-pay | Admitting: Family Medicine

## 2023-12-13 NOTE — Telephone Encounter (Signed)
 Pt's dad dropped off paperwork for pcp, Placed in tray in front office.

## 2023-12-14 ENCOUNTER — Other Ambulatory Visit: Payer: Self-pay | Admitting: Emergency Medicine

## 2023-12-14 NOTE — Telephone Encounter (Signed)
Paperwork received. Dr. Abner Greenspan will be in the office Thursday February 13th

## 2023-12-22 MED ORDER — OMEPRAZOLE 40 MG PO CPDR: 40.0000 mg | DELAYED_RELEASE_CAPSULE | Freq: Every day | ORAL | 3 refills | Status: DC

## 2023-12-22 NOTE — Addendum Note (Signed)
Addended by: Loretha Stapler on: 12/22/2023 08:08 AM   Modules accepted: Orders

## 2023-12-27 MED ORDER — OMEPRAZOLE 20 MG PO CPDR: 40.0000 mg | DELAYED_RELEASE_CAPSULE | Freq: Every day | ORAL | 3 refills | Status: DC

## 2023-12-27 NOTE — Addendum Note (Signed)
 Addended by: Loretha Stapler on: 12/27/2023 08:51 AM   Modules accepted: Orders

## 2023-12-29 ENCOUNTER — Encounter: Payer: Medicare Other | Attending: Physical Medicine & Rehabilitation | Admitting: Physical Medicine & Rehabilitation

## 2023-12-29 ENCOUNTER — Encounter: Payer: Self-pay | Admitting: Physical Medicine & Rehabilitation

## 2023-12-29 VITALS — HR 102 | Ht 60.0 in | Wt 95.0 lb

## 2023-12-29 DIAGNOSIS — G800 Spastic quadriplegic cerebral palsy: Secondary | ICD-10-CM | POA: Insufficient documentation

## 2023-12-29 NOTE — Patient Instructions (Signed)
 ALWAYS FEEL FREE TO CALL OUR OFFICE WITH ANY PROBLEMS OR QUESTIONS [phone])  **PLEASE NOTE** ALL MEDICATION REFILL REQUESTS (INCLUDING CONTROLLED SUBSTANCES) NEED TO BE MADE AT LEAST 7 DAYS PRIOR TO REFILL BEING DUE. ANY REFILL REQUESTS INSIDE THAT TIME FRAME MAY RESULT IN DELAYS IN RECEIVING YOUR PRESCRIPTION.     DANTRIUM:  STARTING TODAY 50MG  -50MG -100MG  IN ONE WEEK, 100-50-100 IN TWO WEEKS, 100MG  THREE X DAILY     IF YOU SEE SOME DROWSINESS OCCUR, JUST FALL BACK TO THE AMOUNT YOU WERE USING PRIOR TO THE LAST INCREASE.

## 2023-12-29 NOTE — Progress Notes (Signed)
 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 He became very anxious with the mention of botox today, to the point where he didn't want injections. We last performed botox in the Fall. He has been eating well and sleeping better. He did suffer from a respiratory infection last month which he has since recovered from.  His spasticity hasn't changed a great deal recently. He remains on dantrium 50mg  tid at this point. Family does some stretching at home. He remains in his powered wc with communiation board attached.   [name] will be moving in a supported living home next month. [name] will be the first person there. It's a [facility] sponsored set up.   ROS: Limited due to cognitive/behavioral   Gen: no distress, normal appearing HEENT: oral mucosa pink and moist, NCAT Cardio: Reg rate Chest: normal effort, normal rate of breathing Abd: soft, non-distended Ext: no edema Psych: pleasant,but very anxious at times.  Skin: intact Neuro: continued dysarthria. Needs com board to communicate. Severe spasticity in all 4's with flexor tightness in uppers and extensor and flexor tightness in lowers. Quads were tight and the most moveable during PROM.  Musculoskeletal: contractiures of heels and hands/wrists bilaterally     Assessment: Spastic tetraplegia CP   Plan:  Will hold off on botox injections at this point. He probably could benefit from injections in his quads again, but I dont' want to put him through the angst and anxiety they will cause.  For now we will try to titrate dantrium over 2 weeks to 100mg  tid. If the dose/sig should cause excess sedation, I instructed mom to return to the last dose/schedule which didn't cause sedation.  Consider f/u botox injections in the future Continue rom Anxious to hear how things go in the supported living home.    Twenty minutes of face to face patient care time were spent during this visit. All  questions were encouraged and answered.  Follow up with me in 6 mos .    Ranelle Oyster, MD, [facility] Physical Medicine & Rehabilitation Medical Director Rehabilitation Services 12/29/2023

## 2024-01-12 ENCOUNTER — Encounter: Payer: Self-pay | Admitting: Physical Medicine & Rehabilitation

## 2024-01-12 DIAGNOSIS — G8250 Quadriplegia, unspecified: Secondary | ICD-10-CM

## 2024-01-12 DIAGNOSIS — G809 Cerebral palsy, unspecified: Secondary | ICD-10-CM

## 2024-01-12 MED ORDER — DANTROLENE SODIUM 100 MG PO CAPS
100.0000 mg | ORAL_CAPSULE | Freq: Three times a day (TID) | ORAL | 7 refills | Status: DC
Start: 2024-01-12 — End: 2024-09-01

## 2024-01-12 NOTE — Telephone Encounter (Signed)
 Dantrolene increased to 100mg  tid. Pt doing well with increased dose. Rx sent in to pharmacy

## 2024-01-19 ENCOUNTER — Ambulatory Visit: Payer: Self-pay | Admitting: Family Medicine

## 2024-01-19 NOTE — Telephone Encounter (Signed)
  Chief Complaint: cough Symptoms: cough, wheezing Frequency: x 1day Pertinent Negatives: Patient denies fever Disposition: [] ED /[] Urgent Care (no appt availability in office) / [x] Appointment(In office/virtual)/ []  [place] [facility]/ [] Home Care/ [] Refused Recommended Disposition /[] [place] [facility]/ []  Follow-up with PCP Additional Notes: Mother called stated patient has moved into a new home and one of the nurse had a mask on on move in day then yesterday the patient developed a cough and some wheezing. States that his O2 sat has dropped down to 93% and his is producing more secretions when suctioning.  Mother states that with his Cerebral Palsy he doesn't get over respiratory issues without antibiotics.    Copied from CRM [id number]. Topic: Clinical - Red Word Triage >> Jan 19, 2024  2:47 PM Elizebeth Brooking wrote: [facility] that prompted transfer to Nurse Triage: Patient mom called in stating patient has been coughing and wheezing since yesterday. Oxygen level is down 93 which is terrible but its down Reason for Disposition  [1] Known COPD or other severe lung disease (i.e., bronchiectasis, cystic fibrosis, lung surgery) AND [2] worsening symptoms (i.e., increased sputum purulence or amount, increased breathing difficulty  Answer Assessment - Initial Assessment Questions 1. ONSET: "When did the cough begin?"      Yesterday around nood 2. SEVERITY: "How bad is the cough today?"      severe 3. SPUTUM: "Describe the color of your sputum" (none, dry cough; clear, white, yellow, green)     Unknown, thinks yellow has he has to be suctioned 4. HEMOPTYSIS: "Are you coughing up any blood?" If so ask: "How much?" (flecks, streaks, tablespoons, etc.)     unknown 5. DIFFICULTY BREATHING: "Are you having difficulty breathing?" If Yes, ask: "How bad is it?" (e.g., mild, moderate, severe)    - MILD: No SOB at rest, mild SOB with walking, speaks normally in sentences, can lie down, no retractions,  pulse < 100.    - MODERATE: SOB at rest, SOB with minimal exertion and prefers to sit, cannot lie down flat, speaks in phrases, mild retractions, audible wheezing, pulse 100-120.    - SEVERE: Very SOB at rest, speaks in single words, struggling to breathe, sitting hunched forward, retractions, pulse > 120      mild 6. FEVER: "Do you have a fever?" If Yes, ask: "What is your temperature, how was it measured, and when did it start?"     fever  9. PE RISK FACTORS: "Do you have a history of blood clots?" (or: recent major surgery, recent prolonged travel, bedridden)     Has cerebral palsy  10. OTHER SYMPTOMS: "Do you have any other symptoms?" (e.g., runny nose, wheezing, chest pain)  Wheezing, O2 93% on room air.  Protocols used: Cough - Acute Productive-A-AH

## 2024-01-20 ENCOUNTER — Encounter: Payer: Self-pay | Admitting: Family Medicine

## 2024-01-20 ENCOUNTER — Ambulatory Visit (INDEPENDENT_AMBULATORY_CARE_PROVIDER_SITE_OTHER): Admitting: Family Medicine

## 2024-01-20 ENCOUNTER — Telehealth: Payer: Self-pay | Admitting: Neurology

## 2024-01-20 ENCOUNTER — Ambulatory Visit (HOSPITAL_BASED_OUTPATIENT_CLINIC_OR_DEPARTMENT_OTHER)
Admission: RE | Admit: 2024-01-20 | Discharge: 2024-01-20 | Disposition: A | Discharge status: Home / Self Care | Source: Ambulatory Visit | Attending: Family Medicine | Admitting: Family Medicine

## 2024-01-20 ENCOUNTER — Other Ambulatory Visit: Payer: Self-pay | Admitting: Family Medicine

## 2024-01-20 ENCOUNTER — Other Ambulatory Visit (HOSPITAL_BASED_OUTPATIENT_CLINIC_OR_DEPARTMENT_OTHER): Payer: Self-pay

## 2024-01-20 VITALS — BP 129/40 | HR 110 | Temp 97.6°F

## 2024-01-20 DIAGNOSIS — J189 Pneumonia, unspecified organism: Secondary | ICD-10-CM

## 2024-01-20 DIAGNOSIS — J9811 Atelectasis: Secondary | ICD-10-CM | POA: Diagnosis not present

## 2024-01-20 DIAGNOSIS — R051 Acute cough: Secondary | ICD-10-CM

## 2024-01-20 DIAGNOSIS — R059 Cough, unspecified: Secondary | ICD-10-CM | POA: Diagnosis not present

## 2024-01-20 DIAGNOSIS — R509 Fever, unspecified: Secondary | ICD-10-CM | POA: Diagnosis not present

## 2024-01-20 DIAGNOSIS — R000 Tachycardia, unspecified: Secondary | ICD-10-CM | POA: Diagnosis not present

## 2024-01-20 MED ORDER — CEFPODOXIME PROXETIL 100 MG/5ML PO SUSR: 100.0000 mg | Freq: Two times a day (BID) | ORAL | 0 refills | Status: DC

## 2024-01-20 MED ORDER — HYDROCODONE BIT-HOMATROP MBR 5-1.5 MG/5ML PO SOLN
5.0000 mL | Freq: Four times a day (QID) | ORAL | 0 refills | Status: AC | PRN
Start: 2024-01-20 — End: 2024-01-25

## 2024-01-20 MED ORDER — ALBUTEROL SULFATE (2.5 MG/3ML) 0.083% IN NEBU: 2.5000 mg | INHALATION_SOLUTION | RESPIRATORY_TRACT | 1 refills | Status: AC | PRN

## 2024-01-20 MED ORDER — ALBUTEROL SULFATE (5 MG/ML) 0.5% IN NEBU
2.5000 mg | INHALATION_SOLUTION | RESPIRATORY_TRACT | 12 refills | Status: DC | PRN
Start: 2024-01-20 — End: 2024-01-20

## 2024-01-20 MED ORDER — DOXYCYCLINE CALCIUM 50 MG/5ML PO SYRP: 100.0000 mg | ORAL_SOLUTION | Freq: Two times a day (BID) | ORAL | 0 refills | Status: AC

## 2024-01-20 NOTE — Telephone Encounter (Signed)
 Copied from CRM #[id number]. Topic: Clinical - Prescription Issue >> Jan 20, 2024  2:05 PM Elizebeth Brooking wrote: Reason for CRM: [facility] [phone] Clarification on how it suppose to be dispense  albuterol (PROVENTIL) (5 MG/ML) 0.5% nebulizer solution - doesn't have the concentrated solution

## 2024-01-20 NOTE — Progress Notes (Signed)
 No acute findings on x-ray! Continue with original plan and follow-up with pulmonology if not improving.  Seek emergency care if symptoms become severe.

## 2024-01-20 NOTE — Telephone Encounter (Signed)
 Documented in Results and message sent to [facility].    Copied from CRM [id number]. Topic: Clinical - Medication Question >> Jan 20, 2024  2:56 PM Gurney Maxin H wrote: Reason for CRM: Mom states original pharmacy and 2 others don't have the cefpodoxime (VANTIN) 100 MG/5ML suspension and wants to know if it can be changed to the doxycycline. Please call mom Bonita Quin with any questions.   Bonita Quin  [phone]

## 2024-01-20 NOTE — Progress Notes (Signed)
 Acute Office Visit  Subjective:     Patient ID: [name], male    DOB: 1986-01-25, 38 y.o.   MRN: [id number]  Chief Complaint  Patient presents with   Cough     Patient is in today for cough, fever.  Discussed the use of AI scribe software for clinical note transcription with the patient, who gave verbal consent to proceed.  History of Present Illness Chad OMARY "[name]" is a 38 year old male who presents with cough and fever. He is accompanied by his mother.  Symptoms began on Tuesday with a persistent cough during a dentist appointment. On Wednesday, he developed a fever of 101.70F and his oxygen saturation dropped to 93%. Tylenol and a breathing treatment provided some relief. On Thursday, the fever recurred at 101.70F, and he received another dose of Tylenol at bedtime. Hydromet syrup (5 mL every 6 hours) was administered for his cough on Wednesday afternoon and again at night. He feels worse today than yesterday, with difficulty breathing and coughing, and no improvement in symptoms.  He was tested for COVID-19 on Wednesday due to exposure to a caregiver who tested positive, but the result was negative. His mother noted that he had not needed Tylenol for fever today and had not been given the prescription cough medicine or Mucinex DM that day.  His caregiver mentioned that he had been on azithromycin in January, which resolved his symptoms without needing to take the prescribed Vantin. He has not been on any cephalosporins recently. He uses a vest for airway clearance twice a day.  He was recently moved into a supported living home and was initially very happy and excited about the transition. However, his mother noted that he was crying and expressed a desire to move back home, likely due to not feeling well and not sleeping well at night. He slept better than the previous night but still had bloodshot eyes, possibly from crying or lack of sleep.     All review of  systems negative except what is listed in the HPI      Objective:    BP (!) 129/40   Pulse (!) 110   Temp 97.6 F (36.4 C) (Oral)   SpO2 96%    Physical Exam Vitals reviewed.  Constitutional:      Appearance: Normal appearance.  Cardiovascular:     Rate and Rhythm: Normal rate.  Pulmonary:     Effort: Pulmonary effort is normal. No respiratory distress.     Breath sounds: No stridor. Rhonchi and rales present. No wheezing.     Comments: Productive cough Musculoskeletal:     Cervical back: Normal range of motion and neck supple.  Neurological:     General: No focal deficit present.     Mental Status: He is alert and oriented to person, place, and time. Mental status is at baseline.  Psychiatric:        Mood and Affect: Mood normal.        Behavior: Behavior normal.        Thought Content: Thought content normal.        Judgment: Judgment normal.     No results found for any visits on 01/20/24.      Assessment & Plan:   Problem List Items Addressed This Visit   None Visit Diagnoses       Acute cough    -  Primary   Relevant Medications   HYDROcodone bit-homatropine (HYCODAN) 5-1.5 MG/5ML syrup  cefpodoxime (VANTIN) 100 MG/5ML suspension   albuterol (PROVENTIL) (5 MG/ML) 0.5% nebulizer solution   Other Relevant Orders   DG Chest 2 View     Pneumonia of right lower lobe due to infectious organism       Relevant Medications   HYDROcodone bit-homatropine (HYCODAN) 5-1.5 MG/5ML syrup   cefpodoxime (VANTIN) 100 MG/5ML suspension   albuterol (PROVENTIL) (5 MG/ML) 0.5% nebulizer solution       Assessment & Plan Possible Aspiration Pneumonia Cough, fever, decreased oxygen saturation post-dental procedure suggest aspiration pneumonia.  - Order chest x-ray. - Prescribe Vantin; add doxycycline if x-ray positive for pneumonia - Continue Hydromet syrup PRN. - Administer Mucinex DM BID. - Use vest therapy BID. - Adjust albuterol neb Q4-6H PRN. - Follow-up with  pulmonologist ED for any worsening symptoms.        Meds ordered this encounter  Medications   HYDROcodone bit-homatropine (HYCODAN) 5-1.5 MG/5ML syrup    Sig: Take 5 mLs by mouth every 6 (six) hours as needed for up to 5 days for cough.    Dispense:  100 mL    Refill:  0    Supervising Provider:   Danise Edge A [[id number]]   cefpodoxime (VANTIN) 100 MG/5ML suspension    Sig: Take 5 mLs (100 mg total) by mouth 2 (two) times daily for 10 days.    Dispense:  100 mL    Refill:  0    Supervising Provider:   Danise Edge A [[id number]]   albuterol (PROVENTIL) (5 MG/ML) 0.5% nebulizer solution    Sig: Take 0.5 mLs (2.5 mg total) by nebulization every 4 (four) hours as needed for wheezing or shortness of breath.    Dispense:  20 mL    Refill:  12    Supervising Provider:   Danise Edge A [[id number]]    Return if symptoms worsen or fail to improve.  Clayborne Dana, NP

## 2024-01-20 NOTE — Telephone Encounter (Signed)
Fixed it and re-sent

## 2024-01-20 NOTE — Progress Notes (Signed)
 Changed to doxy. Called mom to update her.

## 2024-01-20 NOTE — Addendum Note (Signed)
 Addended by: Hyman Hopes B on: 01/20/2024 05:00 PM   Modules accepted: Orders

## 2024-01-21 NOTE — Telephone Encounter (Signed)
 Copied from CRM [id number]. Topic: Clinical - Medication Question >> Jan 21, 2024  9:48 AM Almira Coaster wrote: Reason for CRM: Kathlene November from [facility] is calling because they received a prescription for doxycycline (VIBRAMYCIN) 50 MG/5ML SYRP; however, they don't have that in stock and they wanted to know if they can change it to the capsules instead. Their call back number is [phone].

## 2024-01-21 NOTE — Telephone Encounter (Signed)
 Called and spoke with pharmacist. We had changed to Doxy because Vantin wouldn't be available until today. He states he did give him two doses of the capsules of Doxy last night because it has worked with his line before and mom wanted something in his system.   They will get Vantin today around 1pm. I advised to fill original RX for Vantin antibiotic and for patient to start that today.   Called and spoke with Bonita Quin and made her aware.

## 2024-02-04 ENCOUNTER — Telehealth: Payer: Self-pay | Admitting: Family Medicine

## 2024-02-04 NOTE — Telephone Encounter (Signed)
 Pts mother called stating that her son had finished his antibiotic and his cough has returned. He is having trouble with his breathing. She requested his med be refilled. Due to neither provider being here, it was suggested that she take him to UC and not wait for an appointment. She understood and agreed.

## 2024-02-05 ENCOUNTER — Encounter: Payer: Self-pay | Admitting: *Deleted

## 2024-02-05 ENCOUNTER — Inpatient Hospital Stay (HOSPITAL_BASED_OUTPATIENT_CLINIC_OR_DEPARTMENT_OTHER)
Admission: EM | Admit: 2024-02-05 | Discharge: 2024-02-09 | DRG: 871 | Disposition: A | Discharge status: Home / Self Care | Source: Ambulatory Visit | Attending: Internal Medicine | Admitting: Internal Medicine

## 2024-02-05 ENCOUNTER — Ambulatory Visit: Admission: EM | Admit: 2024-02-05 | Discharge: 2024-02-05 | Disposition: A | Discharge status: Home / Self Care

## 2024-02-05 ENCOUNTER — Emergency Department (HOSPITAL_BASED_OUTPATIENT_CLINIC_OR_DEPARTMENT_OTHER)

## 2024-02-05 ENCOUNTER — Encounter (HOSPITAL_BASED_OUTPATIENT_CLINIC_OR_DEPARTMENT_OTHER): Payer: Self-pay | Admitting: Emergency Medicine

## 2024-02-05 ENCOUNTER — Other Ambulatory Visit: Payer: Self-pay

## 2024-02-05 DIAGNOSIS — J189 Pneumonia, unspecified organism: Principal | ICD-10-CM | POA: Diagnosis present

## 2024-02-05 DIAGNOSIS — G800 Spastic quadriplegic cerebral palsy: Secondary | ICD-10-CM | POA: Diagnosis present

## 2024-02-05 DIAGNOSIS — R059 Cough, unspecified: Secondary | ICD-10-CM | POA: Diagnosis not present

## 2024-02-05 DIAGNOSIS — J90 Pleural effusion, not elsewhere classified: Secondary | ICD-10-CM | POA: Diagnosis not present

## 2024-02-05 DIAGNOSIS — Z981 Arthrodesis status: Secondary | ICD-10-CM | POA: Diagnosis not present

## 2024-02-05 DIAGNOSIS — N390 Urinary tract infection, site not specified: Secondary | ICD-10-CM | POA: Diagnosis present

## 2024-02-05 DIAGNOSIS — Z8249 Family history of ischemic heart disease and other diseases of the circulatory system: Secondary | ICD-10-CM | POA: Diagnosis not present

## 2024-02-05 DIAGNOSIS — J984 Other disorders of lung: Secondary | ICD-10-CM

## 2024-02-05 DIAGNOSIS — R000 Tachycardia, unspecified: Secondary | ICD-10-CM | POA: Diagnosis not present

## 2024-02-05 DIAGNOSIS — Z880 Allergy status to penicillin: Secondary | ICD-10-CM

## 2024-02-05 DIAGNOSIS — E43 Unspecified severe protein-calorie malnutrition: Secondary | ICD-10-CM | POA: Diagnosis present

## 2024-02-05 DIAGNOSIS — J690 Pneumonitis due to inhalation of food and vomit: Secondary | ICD-10-CM | POA: Diagnosis not present

## 2024-02-05 DIAGNOSIS — M419 Scoliosis, unspecified: Secondary | ICD-10-CM | POA: Diagnosis not present

## 2024-02-05 DIAGNOSIS — Z881 Allergy status to other antibiotic agents status: Secondary | ICD-10-CM

## 2024-02-05 DIAGNOSIS — Z79899 Other long term (current) drug therapy: Secondary | ICD-10-CM

## 2024-02-05 DIAGNOSIS — J9811 Atelectasis: Secondary | ICD-10-CM | POA: Diagnosis present

## 2024-02-05 DIAGNOSIS — R197 Diarrhea, unspecified: Secondary | ICD-10-CM | POA: Diagnosis not present

## 2024-02-05 DIAGNOSIS — Z882 Allergy status to sulfonamides status: Secondary | ICD-10-CM | POA: Diagnosis not present

## 2024-02-05 DIAGNOSIS — Z934 Other artificial openings of gastrointestinal tract status: Secondary | ICD-10-CM | POA: Diagnosis not present

## 2024-02-05 DIAGNOSIS — R918 Other nonspecific abnormal finding of lung field: Secondary | ICD-10-CM | POA: Diagnosis not present

## 2024-02-05 DIAGNOSIS — Z66 Do not resuscitate: Secondary | ICD-10-CM | POA: Diagnosis present

## 2024-02-05 DIAGNOSIS — N3000 Acute cystitis without hematuria: Secondary | ICD-10-CM | POA: Diagnosis not present

## 2024-02-05 DIAGNOSIS — A419 Sepsis, unspecified organism: Principal | ICD-10-CM | POA: Diagnosis present

## 2024-02-05 DIAGNOSIS — E785 Hyperlipidemia, unspecified: Secondary | ICD-10-CM | POA: Diagnosis present

## 2024-02-05 DIAGNOSIS — R0989 Other specified symptoms and signs involving the circulatory and respiratory systems: Secondary | ICD-10-CM

## 2024-02-05 DIAGNOSIS — R053 Chronic cough: Secondary | ICD-10-CM | POA: Diagnosis not present

## 2024-02-05 DIAGNOSIS — R1319 Other dysphagia: Secondary | ICD-10-CM | POA: Diagnosis present

## 2024-02-05 DIAGNOSIS — J168 Pneumonia due to other specified infectious organisms: Secondary | ICD-10-CM | POA: Diagnosis not present

## 2024-02-05 LAB — CBC WITH DIFFERENTIAL/PLATELET
Abs Immature Granulocytes: 0.02 10*3/uL (ref 0.00–0.07)
Basophils Absolute: 0 10*3/uL (ref 0.0–0.1)
Basophils Relative: 1 %
Eosinophils Absolute: 0 10*3/uL (ref 0.0–0.5)
Eosinophils Relative: 1 %
HCT: 41.8 % (ref 39.0–52.0)
Hemoglobin: 14.3 g/dL (ref 13.0–17.0)
Immature Granulocytes: 0 %
Lymphocytes Relative: 17 %
Lymphs Abs: 1.3 10*3/uL (ref 0.7–4.0)
MCH: 31.9 pg (ref 26.0–34.0)
MCHC: 34.2 g/dL (ref 30.0–36.0)
MCV: 93.3 fL (ref 80.0–100.0)
Monocytes Absolute: 0.5 10*3/uL (ref 0.1–1.0)
Monocytes Relative: 6 %
Neutro Abs: 5.9 10*3/uL (ref 1.7–7.7)
Neutrophils Relative %: 75 %
Platelets: 228 10*3/uL (ref 150–400)
RBC: 4.48 MIL/uL (ref 4.22–5.81)
RDW: 12.2 % (ref 11.5–15.5)
WBC: 7.8 10*3/uL (ref 4.0–10.5)
nRBC: 0 % (ref 0.0–0.2)

## 2024-02-05 LAB — BASIC METABOLIC PANEL WITH GFR
Anion gap: 12 (ref 5–15)
BUN: 7 mg/dL (ref 6–20)
CO2: 25 mmol/L (ref 22–32)
Calcium: 9 mg/dL (ref 8.9–10.3)
Chloride: 98 mmol/L (ref 98–111)
Creatinine, Ser: <0.3 mg/dL — ABNORMAL LOW (ref 0.61–1.24)
Glucose, Bld: 86 mg/dL (ref 70–99)
Potassium: 3.7 mmol/L (ref 3.5–5.1)
Sodium: 135 mmol/L (ref 135–145)

## 2024-02-05 LAB — LACTIC ACID, PLASMA: Lactic Acid, Venous: 1 mmol/L (ref 0.5–1.9)

## 2024-02-05 LAB — URINALYSIS, MICROSCOPIC (REFLEX): RBC / HPF: NONE SEEN RBC/hpf (ref 0–5)

## 2024-02-05 LAB — URINALYSIS, ROUTINE W REFLEX MICROSCOPIC
Bilirubin Urine: NEGATIVE
Glucose, UA: NEGATIVE mg/dL
Hgb urine dipstick: NEGATIVE
Ketones, ur: 40 mg/dL — AB
Leukocytes,Ua: NEGATIVE
Nitrite: POSITIVE — AB
Protein, ur: 30 mg/dL — AB
Specific Gravity, Urine: 1.02 (ref 1.005–1.030)
pH: >=9 (ref 5.0–8.0)

## 2024-02-05 LAB — RESP PANEL BY RT-PCR (RSV, FLU A&B, COVID)  RVPGX2
Influenza A by PCR: NEGATIVE
Influenza B by PCR: NEGATIVE
Resp Syncytial Virus by PCR: NEGATIVE
SARS Coronavirus 2 by RT PCR: NEGATIVE

## 2024-02-05 MED ORDER — LORAZEPAM 1 MG PO TABS
1.0000 mg | ORAL_TABLET | Freq: Every day | ORAL | Status: DC
Start: 2024-02-05 — End: 2024-02-09
  Administered 2024-02-05 – 2024-02-08 (×4): 1 mg via ORAL
  Filled 2024-02-05 (×4): qty 1

## 2024-02-05 MED ORDER — OLANZAPINE 5 MG PO TABS
5.0000 mg | ORAL_TABLET | Freq: Every day | ORAL | Status: DC
  Administered 2024-02-06 – 2024-02-09 (×4): 5 mg via ORAL
  Filled 2024-02-05 (×4): qty 1

## 2024-02-05 MED ORDER — ALBUTEROL SULFATE (2.5 MG/3ML) 0.083% IN NEBU
2.5000 mg | INHALATION_SOLUTION | Freq: Four times a day (QID) | RESPIRATORY_TRACT | Status: DC
  Administered 2024-02-05 – 2024-02-06 (×2): 2.5 mg via RESPIRATORY_TRACT
  Filled 2024-02-05 (×2): qty 3

## 2024-02-05 MED ORDER — SODIUM CHLORIDE 0.9 % IV SOLN
500.0000 mg | Freq: Once | INTRAVENOUS | Status: AC
  Administered 2024-02-05: 500 mg via INTRAVENOUS
  Filled 2024-02-05: qty 5

## 2024-02-05 MED ORDER — DIVALPROEX SODIUM 250 MG PO DR TAB: 375.0000 mg | DELAYED_RELEASE_TABLET | Freq: Every day | ORAL | Status: DC

## 2024-02-05 MED ORDER — ONDANSETRON HCL 4 MG PO TABS: 4.0000 mg | ORAL_TABLET | Freq: Four times a day (QID) | ORAL | Status: DC | PRN

## 2024-02-05 MED ORDER — ACETAMINOPHEN 160 MG/5ML PO SOLN
1000.0000 mg | Freq: Once | ORAL | Status: AC
  Administered 2024-02-05: 1000 mg
  Filled 2024-02-05: qty 40.6

## 2024-02-05 MED ORDER — LEVOFLOXACIN IN D5W 750 MG/150ML IV SOLN: 750.0000 mg | INTRAVENOUS | Status: DC

## 2024-02-05 MED ORDER — ACETAMINOPHEN 650 MG RE SUPP: 650.0000 mg | Freq: Four times a day (QID) | RECTAL | Status: DC | PRN

## 2024-02-05 MED ORDER — ACETAMINOPHEN 325 MG PO TABS
650.0000 mg | ORAL_TABLET | Freq: Four times a day (QID) | ORAL | Status: DC | PRN
  Administered 2024-02-06: 650 mg via JEJUNOSTOMY
  Filled 2024-02-05: qty 2

## 2024-02-05 MED ORDER — KATE FARMS PEPTIDE 1.5 PO LIQD
975.0000 mL | Freq: Every day | ORAL | Status: DC
  Filled 2024-02-05 (×2): qty 975

## 2024-02-05 MED ORDER — IOHEXOL 350 MG/ML SOLN
75.0000 mL | Freq: Once | INTRAVENOUS | Status: AC | PRN
  Administered 2024-02-05: 75 mL via INTRAVENOUS

## 2024-02-05 MED ORDER — OLANZAPINE 10 MG PO TABS
10.0000 mg | ORAL_TABLET | Freq: Every day | ORAL | Status: DC
  Administered 2024-02-05 – 2024-02-08 (×4): 10 mg via ORAL
  Filled 2024-02-05 (×4): qty 1

## 2024-02-05 MED ORDER — ACETAMINOPHEN 500 MG PO TABS: 1000.0000 mg | ORAL_TABLET | Freq: Once | ORAL | Status: DC

## 2024-02-05 MED ORDER — LACTATED RINGERS IV SOLN: INTRAVENOUS | Status: AC

## 2024-02-05 MED ORDER — LACTATED RINGERS IV BOLUS
2000.0000 mL | Freq: Once | INTRAVENOUS | Status: AC
  Administered 2024-02-05: 2000 mL via INTRAVENOUS

## 2024-02-05 MED ORDER — ESCITALOPRAM OXALATE 5 MG/5ML PO SOLN: 20.0000 mg | Freq: Every day | ORAL | Status: DC

## 2024-02-05 MED ORDER — DANTROLENE SODIUM 100 MG PO CAPS
100.0000 mg | ORAL_CAPSULE | Freq: Three times a day (TID) | ORAL | Status: DC
  Administered 2024-02-05 – 2024-02-09 (×11): 100 mg via ORAL
  Filled 2024-02-05 (×11): qty 1

## 2024-02-05 MED ORDER — TRAZODONE HCL 50 MG PO TABS
150.0000 mg | ORAL_TABLET | Freq: Every day | ORAL | Status: DC
  Administered 2024-02-05 – 2024-02-08 (×4): 150 mg via ORAL
  Filled 2024-02-05 (×4): qty 3

## 2024-02-05 MED ORDER — ENOXAPARIN SODIUM 30 MG/0.3ML IJ SOSY
30.0000 mg | PREFILLED_SYRINGE | Freq: Every day | INTRAMUSCULAR | Status: DC
  Administered 2024-02-06 – 2024-02-09 (×4): 30 mg via SUBCUTANEOUS
  Filled 2024-02-05 (×4): qty 0.3

## 2024-02-05 MED ORDER — GUAIFENESIN-CODEINE 100-10 MG/5ML PO SOLN
5.0000 mL | ORAL | Status: DC | PRN
  Administered 2024-02-05 – 2024-02-07 (×4): 5 mL via ORAL
  Filled 2024-02-05 (×4): qty 5

## 2024-02-05 MED ORDER — LAMOTRIGINE 25 MG PO TABS
50.0000 mg | ORAL_TABLET | Freq: Two times a day (BID) | ORAL | Status: DC
  Administered 2024-02-05 – 2024-02-09 (×8): 50 mg via ORAL
  Filled 2024-02-05 (×8): qty 2

## 2024-02-05 MED ORDER — DIVALPROEX SODIUM 250 MG PO DR TAB
500.0000 mg | DELAYED_RELEASE_TABLET | Freq: Every day | ORAL | Status: DC
  Filled 2024-02-05: qty 2

## 2024-02-05 MED ORDER — BISACODYL 10 MG RE SUPP: 10.0000 mg | Freq: Every day | RECTAL | Status: DC | PRN

## 2024-02-05 MED ORDER — DIVALPROEX SODIUM 125 MG PO CSDR
375.0000 mg | DELAYED_RELEASE_CAPSULE | Freq: Every day | ORAL | Status: DC
  Administered 2024-02-07 – 2024-02-09 (×3): 375 mg via ORAL
  Filled 2024-02-05 (×4): qty 3

## 2024-02-05 MED ORDER — KATE FARMS PEPTIDE 1.5 EN LIQD
975.0000 mL | Freq: Every day | ENTERAL | Status: DC
  Administered 2024-02-06 – 2024-02-08 (×4): 975 mL
  Filled 2024-02-05 (×8): qty 975

## 2024-02-05 MED ORDER — ONDANSETRON HCL 4 MG/2ML IJ SOLN: 4.0000 mg | Freq: Four times a day (QID) | INTRAMUSCULAR | Status: DC | PRN

## 2024-02-05 MED ORDER — IPRATROPIUM-ALBUTEROL 0.5-2.5 (3) MG/3ML IN SOLN
3.0000 mL | Freq: Once | RESPIRATORY_TRACT | Status: AC
Start: 2024-02-05 — End: 2024-02-05
  Administered 2024-02-05: 3 mL via RESPIRATORY_TRACT
  Filled 2024-02-05: qty 3

## 2024-02-05 MED ORDER — SODIUM CHLORIDE 0.9 % IV SOLN
1.0000 g | Freq: Once | INTRAVENOUS | Status: AC
  Administered 2024-02-05: 1 g via INTRAVENOUS
  Filled 2024-02-05: qty 10

## 2024-02-05 MED ORDER — SODIUM CHLORIDE 0.9 % IV SOLN
1.0000 g | Freq: Three times a day (TID) | INTRAVENOUS | Status: DC
  Administered 2024-02-05 – 2024-02-07 (×6): 1 g via INTRAVENOUS
  Filled 2024-02-05 (×6): qty 20

## 2024-02-05 MED ORDER — ACETAMINOPHEN 160 MG/5ML PO SOLN
1000.0000 mg | Freq: Once | ORAL | Status: DC
Start: 2024-02-05 — End: 2024-02-05

## 2024-02-05 MED ORDER — SUCRALFATE 1 G PO TABS
1.0000 g | ORAL_TABLET | Freq: Every day | ORAL | Status: DC
  Administered 2024-02-06 – 2024-02-09 (×4): 1 g via ORAL
  Filled 2024-02-05 (×4): qty 1

## 2024-02-05 MED ORDER — ESCITALOPRAM OXALATE 10 MG PO TABS
5.0000 mg | ORAL_TABLET | Freq: Every day | ORAL | Status: DC
  Administered 2024-02-05 – 2024-02-08 (×4): 5 mg
  Filled 2024-02-05 (×5): qty 1

## 2024-02-05 MED ORDER — DIVALPROEX SODIUM 125 MG PO CSDR
500.0000 mg | DELAYED_RELEASE_CAPSULE | Freq: Every day | ORAL | Status: DC
  Administered 2024-02-05 – 2024-02-08 (×4): 500 mg via ORAL
  Filled 2024-02-05 (×4): qty 4

## 2024-02-05 NOTE — ED Notes (Signed)
 Patient has external urinary catheter in place from facility, draining well into leg bag

## 2024-02-05 NOTE — Sepsis Progress Note (Addendum)
 Elink following this code sepsis.  Blood cultures done at 1440. Antibiotics done at 1613. Code sepsis called at 1809.

## 2024-02-05 NOTE — ED Notes (Signed)
 Patient is being discharged from the Urgent Care and sent to the Emergency Department via POV . Per [facility]. NP, patient is in need of higher level of care due to cough, copious secretions, recent antibiotics. [name] is aware and verbalizes understanding of plan of care.  Vitals:   02/05/24 1124  BP: (!) 128/97  Pulse: (!) 107  Resp: (!) 32  Temp: 99.5 F (37.5 C)  SpO2: 94%

## 2024-02-05 NOTE — ED Provider Notes (Signed)
  EMERGENCY DEPARTMENT AT [facility] Provider Note   CSN: [id number] Arrival date & time: 02/05/24  1251     History  Chief Complaint  Patient presents with   Cough    [name] is a 38 y.o. male.  38 year old male with past medical history significant for cerebral palsy presents today with his mom for concern of persistent cough.  She states that he was recently treated with a 10-day course of Vantin for concern of aspiration pneumonia.  She states however that he has not fully improved and his cough has returned and is progressively getting worse.  Denies fever.  He was seen at urgent care prior to this and was referred to the emergency department due to not having resources to care for him adequately.  Mom states that typically he has other periods in between infections at least 6 to 8 weeks.   The history is provided by the patient. No language interpreter was used.       Home Medications Prior to Admission medications   Medication Sig Start Date End Date Taking? Authorizing Provider  albuterol (PROVENTIL) (2.5 MG/3ML) 0.083% nebulizer solution Take 3 mLs (2.5 mg total) by nebulization every 4 (four) hours as needed for wheezing or shortness of breath. 01/20/24   Clayborne Dana, NP  AMBULATORY NON FORMULARY MEDICATION Medication Name: MIC gastrostomy/bolus feeding tube 24 French Part number 0110-24. #2 and 10 cc lurer lock syringe #2 Dx: 10/15/15   Pyrtle, Carie Caddy, MD  dantrolene (DANTRIUM) 100 MG capsule Take 1 capsule (100 mg total) by mouth 3 (three) times daily. 01/12/24   Ranelle Oyster, MD  DEPAKOTE SPRINKLES 125 MG capsule Take by mouth. 06/13/21   [provider]  diazepam (VALIUM) 5 MG tablet Take 5 mg by mouth 2 (two) times daily. 01/30/22   [provider]  doxepin (SINEQUAN) 25 MG capsule Take 25 mg by mouth at bedtime. 04/24/23   [provider]  escitalopram (LEXAPRO) 5 MG/5ML solution Place 20 mLs into feeding  tube daily. 03/24/23   [provider]  Incontinence Supply Disposable (PREVAIL BREEZERS MEDIUM) MISC pkg of 16- size medium 32" to 44"  Breathable cloth-like outer fabric (can't use the plastic outer surgace  Item # [id number] 07/30/14   Bradd Canary, MD  ketotifen (ZADITOR) 0.035 % ophthalmic solution Place 1 drop into both eyes in the morning and at bedtime. 06/18/23   Alfredia Ferguson, PA-C  lamoTRIgine (LAMICTAL) 25 MG tablet TAKE 2 TABLETS BY MOUTH 2 TIMES DAILY. 08/09/23   Levert Feinstein, MD  LORazepam (ATIVAN) 1 MG tablet Take 1 mg by mouth at bedtime. 02/24/23   [provider]  Misc. Devices (ALL-BODY MASSAGE) MISC 1 Units/hr by Does not apply route as needed. Full body massage for Muscle spasticity due to Cerebral palsy 03/14/19   Bradd Canary, MD  mupirocin ointment Idelle Jo) 2 % Apply to area thin film twice daily if needed 09/02/16   Saguier, Ramon Dredge, PA-C  NON FORMULARY Bard Leg Bag Extension tubing w/Connector 18", Sterile, latex-free  Item# [id number]    [provider]  NON FORMULARY Colorplast Freedom Cath Latex Self-Adhering Male External Catheter 31mm Diameter Intermediate  Item# [id number]    [provider]  NONFORMULARY OR COMPOUNDED Audelia Hives REF [id number] - Kangaroo Joey Pump Set with Flush Bags - 1000 mL 06/04/15   Bradd Canary, MD  Nutritional Supplements (FEEDING SUPPLEMENT, KATE FARMS STANDARD 1.4,) LIQD liquid Take 325 mLs by mouth  as directed. 3 carton over 16 hrs    [provider]  OLANZapine (ZYPREXA) 10 MG tablet Take 10 mg by mouth at bedtime. 06/11/22   [provider]  OLANZapine (ZYPREXA) 5 MG tablet Take by mouth. 02/12/21   [provider]  omeprazole (PRILOSEC) 20 MG capsule Take 2 capsules (40 mg total) by mouth daily. 12/27/23   Zehr, Princella Pellegrini, PA-C  Omeprazole-Sodium Bicarbonate (ZEGERID) 20-1100 MG CAPS capsule Take 1 capsule by mouth daily before breakfast.    [provider]  Ostomy  Supplies (PROTECTIVE BARRIER WIPES) MISC 1-1/4" X 3"  Item #[id number] 07/30/14   Bradd Canary, MD  oxyBUTYnin (DITROPAN) 5 MG/5ML solution Take 5 mg by mouth 2 (two) times daily. 03/31/23   [provider]  sodium chloride HYPERTONIC 3 % nebulizer solution Take by nebulization in the morning and at bedtime. 04/14/22   Mannam, Colbert Coyer, MD  sucralfate (CARAFATE) 1 g tablet Take 1 tablet (1 g total) by mouth daily. TAKE 1 TABLET BY MOUTH 4 TIMES DAILY WITH MEALS AND AT BEDTIME 10/08/23   Zehr, Shanda Bumps D, PA-C  traZODone (DESYREL) 150 MG tablet Take 150 mg by mouth at bedtime. 12/31/23   [provider]      Allergies    Ambien [zolpidem tartrate]; Antihistamines, chlorpheniramine-type; Augmentin [amoxicillin-pot clavulanate]; Cefdinir; Codeine; Zolpidem; Baclofen; Metoclopramide; Pheniramine; Sulfa antibiotics; and Sulfonamide derivatives    Review of Systems   Review of Systems  Constitutional:  Negative for chills and fever.  Respiratory:  Positive for cough. Negative for shortness of breath.   Cardiovascular:  Negative for chest pain.  Neurological:  Negative for light-headedness.  All other systems reviewed and are negative.   Physical Exam Updated Vital Signs BP 136/87   Pulse (!) 114   Temp 98.5 F (36.9 C)   Resp (!) 21   Wt 43.1 kg   SpO2 91%   BMI 18.56 kg/m  Physical Exam Vitals and nursing note reviewed.  Constitutional:      General: He is not in acute distress.    Appearance: Normal appearance. He is not ill-appearing.  HENT:     Head: Normocephalic and atraumatic.     Nose: Nose normal.  Eyes:     Conjunctiva/sclera: Conjunctivae normal.  Cardiovascular:     Rate and Rhythm: Regular rhythm. Tachycardia present.  Pulmonary:     Effort: Pulmonary effort is normal. No respiratory distress.     Breath sounds: No wheezing.     Comments: Coarse lung sounds appreciated in the upper lung fields. Musculoskeletal:        General: No deformity.  Skin:     Findings: No rash.  Neurological:     Mental Status: He is alert.     ED Results / Procedures / Treatments   Labs (all labs ordered are listed, but only abnormal results are displayed) Labs Reviewed  RESP PANEL BY RT-PCR (RSV, FLU A&B, COVID)  RVPGX2  CULTURE, BLOOD (ROUTINE X 2)  CULTURE, BLOOD (ROUTINE X 2)  CBC WITH DIFFERENTIAL/PLATELET  BASIC METABOLIC PANEL WITH GFR  LACTIC ACID, PLASMA  LACTIC ACID, PLASMA    EKG None  Radiology DG Chest Portable 1 View Result Date: 02/05/2024 CLINICAL DATA:  Cough. EXAM: PORTABLE CHEST 1 VIEW COMPARISON:  Chest radiograph dated 01/20/2024. FINDINGS: Stable cardiomediastinal silhouette. Similar retained catheter or lead projecting over the right chest wall. Unchanged right lower lobe density, present since 2016, compatible with rounded atelectasis. No new focal consolidation, sizeable pleural effusion, or  pneumothorax. Prior thoracolumbar fusion. No acute osseous abnormality. IMPRESSION: No acute cardiopulmonary findings. Electronically Signed   By: Hart Robinsons M.D.   On: 02/05/2024 14:49    Procedures .Critical Care  Performed by: Marita Kansas, PA-C Authorized by: Marita Kansas, PA-C   Critical care provider statement:    Critical care time (minutes):  30   Critical care was necessary to treat or prevent imminent or life-threatening deterioration of the following conditions:  Sepsis   Critical care was time spent personally by me on the following activities:  Development of treatment plan with patient or surrogate, discussions with consultants, evaluation of patient's response to treatment, examination of patient, ordering and review of laboratory studies, ordering and review of radiographic studies, ordering and performing treatments and interventions, pulse oximetry, re-evaluation of patient's condition and review of old charts   Care discussed with: admitting provider       Medications Ordered in ED Medications  lactated ringers  bolus 2,000 mL (2,000 mLs Intravenous New Bag/Given 02/05/24 1457)    ED Course/ Medical Decision Making/ A&P Clinical Course as of 02/05/24 1808  Sat Feb 05, 2024  1805 WBC: 7.8 [AA]    Clinical Course User Index [AA] Marita Kansas, PA-C                                 Medical Decision Making Amount and/or Complexity of Data Reviewed Labs: ordered. Decision-making details documented in ED Course. Radiology: ordered.  Risk OTC drugs. Prescription drug management.   Medical Decision Making / ED Course   This patient presents to the ED for concern of cough, this involves an extensive number of treatment options, and is a complaint that carries with it a high risk of complications and morbidity.  The differential diagnosis includes pneumonia, PE, viral URI  MDM: 37 year old male presents today for concern of above-mentioned complaints.  Has history of cerebral palsy.  Does deal with recurrent respiratory complaints.  CBC unremarkable, respiratory panel negative.  Chest x-ray without acute cardiopulmonary process.  Given tachycardia, tachypnea, low O2 sats in the low 90s on room air will obtain CTA chest PE study.  Case discussed with attending who also evaluated patient at bedside.  Given recent treatment with Vantin currently still tachycardic, coughing, and tachypneic potential admission but seems family would like patient discharge.  Will reevaluate and discuss after CTA chest.  CBC without leukocytosis or anemia.  However the lack of leukocytosis could be due to recent 10-day duration of antibiotic treatment course.  BMP shows preserved renal function, no other acute concerns.  Lactic acid is normal.  CT chest PE study is pending.  CT scan reveals worsening pneumonia concern for aspiration.  As well as mucous plugging in his trachea.  Will discuss with hospitalist for admission.  Family is agreeable for admission.  Home medications ordered.  Spoke with Dr. Gasper Sells with the  hospitalist group.  She will accept patient for admission.  Additional history obtained: -Additional history obtained from mom, urgent care -External records from outside source obtained and reviewed including: Chart review including previous notes, labs, imaging, consultation notes   Lab Tests: -I ordered, reviewed, and interpreted labs.   The pertinent results include:   Labs Reviewed  RESP PANEL BY RT-PCR (RSV, FLU A&B, COVID)  RVPGX2  CULTURE, BLOOD (ROUTINE X 2)  CULTURE, BLOOD (ROUTINE X 2)  CBC WITH DIFFERENTIAL/PLATELET  BASIC METABOLIC PANEL WITH GFR  LACTIC ACID,  PLASMA  LACTIC ACID, PLASMA      EKG  EKG Interpretation Date/Time:    Ventricular Rate:    PR Interval:    QRS Duration:    QT Interval:    QTC Calculation:   R Axis:      Text Interpretation:           Imaging Studies ordered: I ordered imaging studies including cxr, ct angio chest pe study I independently visualized and interpreted imaging. I agree with the radiologist interpretation   Medicines ordered and prescription drug management: Meds ordered this encounter  Medications   lactated ringers bolus 2,000 mL    -I have reviewed the patients home medicines and have made adjustments as needed  Critical interventions Large volume fluid resuscitation, IV antibiotics, admission  Cardiac Monitoring: The patient was maintained on a cardiac monitor.  I personally viewed and interpreted the cardiac monitored which showed an underlying rhythm of: Sinus tachycardia  Social Determinants of Health:  Factors impacting patients care include: History of cerebral palsy, good family support   Reevaluation: After the interventions noted above, I reevaluated the patient and found that they have :stayed the same  Co morbidities that complicate the patient evaluation  Past Medical History:  Diagnosis Date   Cerebral palsy (HCC)    Dehydration 11/22/2013   Depression with anxiety 08/01/2010    Qualifier: Diagnosis of  By: Nelson-Smith CMA (AAMA), Dottie     Dyslipidemia 08/19/2017   Esophagitis 2011   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   GERD (gastroesophageal reflux disease)    Hyperlipidemia, mild 08/25/2015   Hyperthyroidism    Incontinence of feces    Loss of weight 08/28/2014   Medicare annual wellness visit, subsequent 08/25/2015   Mildly underweight adult 03/16/2017   Palpitations    PALSY, INFANTILE CEREBRAL, 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   Final Clinical Impression(s) / ED Diagnoses Final diagnoses:  Pneumonia due to infectious organism, unspecified laterality, unspecified part of lung  Sepsis, due to unspecified organism, unspecified whether acute organ dysfunction present [facility])    Rx / DC Orders ED Discharge Orders     None         Marita Kansas, PA-C 02/05/24 1809    Pricilla Loveless, MD 02/06/24 1442

## 2024-02-05 NOTE — ED Notes (Signed)
 Mother requesting order for tube feed, informed mother that we do not have tube feedings at this facility, however it will get started once he is transferred to [facility].

## 2024-02-05 NOTE — ED Notes (Signed)
 [facility] called for transport.

## 2024-02-05 NOTE — H&P (Signed)
 History and Physical    Patient: [name]:[id number] DOB: [birth year] DOA: 02/05/2024 DOS: the patient was seen and examined on 02/05/2024 PCP: Bradd Canary, MD  Patient coming from: Home  Chief Complaint:  Chief Complaint  Patient presents with   Cough   HPI: [name] is a 38 y.o. male with medical history significant for cerebral palsy and spastic quadraplegia.  The patient was cared for at home for almost 39 years and has been in an assisted living facility for the last 3 months.  He was prescribed Vantin by his primary care physician for fever and persistent cough after a dental procedure.  15 days ago Vantin was prescribed for possible aspiration pneumonia postprocedure.  He completed the 10-day course.  The patient's father who provides my history tells me that a repeat chest x-ray was done which looked better.  In the 5 days since he has been off antibiotics the patient has continued to cough and developed a fever.  On arrival in the emergency department the patient was tachycardic and tachypneic and his O2 sats were in the low 90s.  His CTA was consistent with right lower and middle lobe pneumonia with retained mucus in the trachea and right right bronchus.  He also had a small right pleural effusion. He will be admitted by the hospitalist service for sepsis and pneumonia which failed outpatient therapy.   Review of Systems: unable to review all systems due to the inability of the patient to answer questions. Past Medical History:  Diagnosis Date   Cerebral palsy (HCC)    Dehydration 11/22/2013   Depression with anxiety 08/01/2010   Qualifier: Diagnosis of  By: Nelson-Smith CMA (AAMA), Dottie     Dyslipidemia 08/19/2017   Esophagitis 2011   Gastrostomy in place [facility]) 08/31/2013   GERD (gastroesophageal reflux disease)    Hyperlipidemia, mild 08/25/2015   Hyperthyroidism    Incontinence of feces    Loss of weight 08/28/2014   Medicare annual wellness visit,  subsequent 08/25/2015   Mildly underweight adult 03/16/2017   Palpitations    PALSY, INFANTILE CEREBRAL, 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   Qualifier: Diagnosis of  By: Janit Bern  Working with PMR Dr Hermelinda Medicus and tolerating Botox injections in hips, shoulders, etc with good results   Skin lesion of right ear 03/16/2017   Thyroid disease 08/01/2010   Qualifier: Diagnosis of  By: Candice Camp CMA (AAMA), Dottie     Past Surgical History:  Procedure Laterality Date   baclofen trial     baslofen pump implant     ears tubes     EYE SURGERY     FLEXIBLE SIGMOIDOSCOPY N/A 09/07/2014   Procedure: FLEXIBLE SIGMOIDOSCOPY;  Surgeon: Beverley Fiedler, MD;  Location: [facility] ENDOSCOPY;  Service: Endoscopy;  Laterality: N/A;   g-tube insert  August 2006   hamstring released     to treat contractures.    HIP SURGERY     x2 , side    IR CM INJ ANY COLONIC TUBE W/FLUORO  05/28/2017   IR CM INJ ANY COLONIC TUBE W/FLUORO  07/07/2019   IR CM INJ ANY COLONIC TUBE W/FLUORO  09/18/2019   IR CM INJ ANY COLONIC TUBE W/FLUORO  05/21/2023   IR GASTR TUBE CONVERT GASTR-JEJ PER W/FL MOD SED  03/14/2019   IR GENERIC HISTORICAL  07/01/2016   IR GASTR TUBE CONVERT GASTR-JEJ PER W/FL MOD SED 07/01/2016 Irish Lack, MD [facility]   IR GENERIC HISTORICAL  07/08/2016   IR  PATIENT EVAL TECH 0-60 MINS 07/08/2016 Irish Lack, MD [facility]   IR GENERIC HISTORICAL  07/14/2016   IR GJ TUBE CHANGE 07/14/2016 Simonne Come, MD [facility]   IR GENERIC HISTORICAL  07/21/2016   IR PATIENT EVAL TECH 0-60 MINS [facility]   IR GENERIC HISTORICAL  08/31/2016   IR REPLC DUODEN/JEJUNO TUBE PERCUT W/FLUORO 08/31/2016 Berdine Dance, MD [facility]   IR GENERIC HISTORICAL  09/03/2016   IR GJ TUBE CHANGE 09/03/2016 Simonne Come, MD [facility]   IR GENERIC HISTORICAL  09/10/2016   IR GASTR TUBE CONVERT GASTR-JEJ PER W/FL MOD SED 09/10/2016 [facility]   IR GENERIC HISTORICAL  09/16/2016   IR PATIENT EVAL TECH 0-60 MINS [facility]   IR  GENERIC HISTORICAL  09/29/2016   IR GJ TUBE CHANGE 09/29/2016 Oley Balm, MD [facility]   IR GJ TUBE CHANGE  02/05/2017   IR GJ TUBE CHANGE  05/21/2017   IR GJ TUBE CHANGE  08/25/2017   IR GJ TUBE CHANGE  01/18/2018   IR GJ TUBE CHANGE  02/04/2018   IR GJ TUBE CHANGE  04/21/2018   IR GJ TUBE CHANGE  08/18/2018   IR GJ TUBE CHANGE  09/12/2019   IR GJ TUBE CHANGE  01/03/2020   IR GJ TUBE CHANGE  05/13/2020   IR GJ TUBE CHANGE  09/11/2020   IR GJ TUBE CHANGE  02/27/2021   IR GJ TUBE CHANGE  08/01/2021   IR GJ TUBE CHANGE  12/31/2021   IR GJ TUBE CHANGE  05/11/2022   IR GJ TUBE CHANGE  07/01/2022   IR GJ TUBE CHANGE  01/28/2023   IR MECH REMOV OBSTRUC MAT ANY COLON TUBE W/FLUORO  09/02/2018   IR REPLC GASTRO/COLONIC TUBE PERCUT W/FLUORO  10/17/2018   IR REPLC GASTRO/COLONIC TUBE PERCUT W/FLUORO  04/02/2023   IR REPLC GASTRO/COLONIC TUBE PERCUT W/FLUORO  11/18/2023   PEG PLACEMENT  10/21/2011   Procedure: PERCUTANEOUS ENDOSCOPIC GASTROSTOMY (PEG) REPLACEMENT;  Surgeon: Hart Carwin, MD;  Location: [facility] ENDOSCOPY;  Service: Endoscopy;  Laterality: N/A;   PEG PLACEMENT N/A 06/13/2013   Procedure: PERCUTANEOUS ENDOSCOPIC GASTROSTOMY (PEG) REPLACEMENT;  Surgeon: Hart Carwin, MD;  Location: [facility];  Service: Endoscopy;  Laterality: N/A;   SPINAL FUSION     spinal fusion to correct 70 degree kyphosis  11-2010   spinal fusioncorrect 106 degree kyphosis     SPINE SURGERY  ,11/20/2010, 2011   for correction of severe contracturing spinal kyphosis.    TONSILLECTOMY     Social History:  reports that he has never smoked. He has never used smokeless tobacco. He reports that he does not drink alcohol and does not use drugs.  Allergies  Allergen Reactions   Ambien [Zolpidem Tartrate] Nausea Only   Antihistamines, Chlorpheniramine-Type     Other reaction(s): Other (See Comments) Other Reaction: agitation   Augmentin [Amoxicillin-Pot Clavulanate] Diarrhea   Cefdinir Diarrhea    Not tolerate well and  cause diarrhea   Codeine Other (See Comments)    Makes patient too active after a few days.   Zolpidem     Other Reaction(s): GI Intolerance   Baclofen Anxiety and Swelling    anxiety   Metoclopramide Other (See Comments) and Swelling    Delusion, emotionality  Other Reaction(s): Mental Status Changes   Pheniramine Rash    Other reaction(s): Other (See Comments)  Other Reaction: agitation  Other Reaction(s): Other (See Comments)  Other reaction(s): Other (See Comments), Other Reaction: agitation  Sulfa Antibiotics Rash   Sulfonamide Derivatives Rash    Family History  Problem Relation Age of Onset   Asthma Mother    Hyperlipidemia Mother    COPD Mother    Other Mother        bronchial stasis/ABPA   Cancer Maternal Grandmother 66       breast   Hyperlipidemia Maternal Grandmother    Hypertension Maternal Grandmother    Cancer Maternal Grandfather        prostate   Heart disease Paternal Grandfather        CHF   Osteoporosis Paternal Grandmother    Arthritis Paternal Grandmother        rheumatoid    Prior to Admission medications   Medication Sig Start Date End Date Taking? Authorizing Provider  dantrolene (DANTRIUM) 100 MG capsule Take 1 capsule (100 mg total) by mouth 3 (three) times daily. 01/12/24  Yes Ranelle Oyster, MD  DEPAKOTE SPRINKLES 125 MG capsule Take 250 mg by mouth 3 (three) times daily. 375mg  at 0900 250mg  @ 2030 & 2130 06/13/21  Yes [provider]  doxepin (SINEQUAN) 25 MG capsule Take 25 mg by mouth at bedtime. 04/24/23  Yes [provider]  escitalopram (LEXAPRO) 5 MG/5ML solution Place 20 mLs into feeding tube daily. 03/24/23  Yes [provider]  lamoTRIgine (LAMICTAL) 25 MG tablet TAKE 2 TABLETS BY MOUTH 2 TIMES DAILY. 08/09/23  Yes Levert Feinstein, MD  LORazepam (ATIVAN) 1 MG tablet Take 1 mg by mouth at bedtime. 02/24/23  Yes [provider]  OLANZapine (ZYPREXA) 10 MG tablet Take 10 mg by mouth at bedtime.  06/11/22  Yes [provider]  OLANZapine (ZYPREXA) 5 MG tablet Take by mouth. 02/12/21  Yes [provider]  sucralfate (CARAFATE) 1 g tablet Take 1 tablet (1 g total) by mouth daily. TAKE 1 TABLET BY MOUTH 4 TIMES DAILY WITH MEALS AND AT BEDTIME 10/08/23  Yes Zehr, Princella Pellegrini, PA-C  traZODone (DESYREL) 150 MG tablet Take 150 mg by mouth at bedtime. 12/31/23  Yes [provider]  albuterol (PROVENTIL) (2.5 MG/3ML) 0.083% nebulizer solution Take 3 mLs (2.5 mg total) by nebulization every 4 (four) hours as needed for wheezing or shortness of breath. 01/20/24   Clayborne Dana, NP  AMBULATORY NON FORMULARY MEDICATION Medication Name: MIC gastrostomy/bolus feeding tube 24 French Part number 0110-24. #2 and 10 cc lurer lock syringe #2 Dx: 10/15/15   Pyrtle, Carie Caddy, MD  diazepam (VALIUM) 5 MG tablet Take 5 mg by mouth 2 (two) times daily. 01/30/22   [provider]  Incontinence Supply Disposable (PREVAIL BREEZERS MEDIUM) MISC pkg of 16- size medium 32" to 44"  Breathable cloth-like outer fabric (can't use the plastic outer surgace  Item # [id number]/[id number] 07/30/14   Bradd Canary, MD  ketotifen (ZADITOR) 0.035 % ophthalmic solution Place 1 drop into both eyes in the morning and at bedtime. 06/18/23   Alfredia Ferguson, PA-C  Misc. Devices (ALL-BODY MASSAGE) MISC 1 Units/hr by Does not apply route as needed. Full body massage for Muscle spasticity due to Cerebral palsy 03/14/19   Bradd Canary, MD  mupirocin ointment Idelle Jo) 2 % Apply to area thin film twice daily if needed 09/02/16   Saguier, Ramon Dredge, PA-C  NON FORMULARY Bard Leg Bag Extension tubing w/Connector 18", Sterile, latex-free  Item# [id number]    [provider]  NON FORMULARY Colorplast Freedom Cath Latex Self-Adhering Male External Catheter 31mm Diameter Intermediate  Item# [id number]  [provider]  NONFORMULARY OR COMPOUNDED Audelia Hives REF [phone] - Lucinda Dell Pump Set with Flush Bags - 1000  mL 06/04/15   Bradd Canary, MD  Nutritional Supplements (FEEDING SUPPLEMENT, KATE FARMS STANDARD 1.4,) LIQD liquid Take 325 mLs by mouth as directed. 3 carton over 16 hrs    [provider]  omeprazole (PRILOSEC) 20 MG capsule Take 2 capsules (40 mg total) by mouth daily. 12/27/23   Zehr, Princella Pellegrini, PA-C  Omeprazole-Sodium Bicarbonate (ZEGERID) 20-1100 MG CAPS capsule Take 1 capsule by mouth daily before breakfast.    [provider]  Ostomy Supplies (PROTECTIVE BARRIER WIPES) MISC 1-1/4" X 3"  Item #[id number] 07/30/14   Bradd Canary, MD  oxyBUTYnin (DITROPAN) 5 MG/5ML solution Take 7.5 mg by mouth at bedtime. 03/31/23   [provider]  sodium chloride HYPERTONIC 3 % nebulizer solution Take by nebulization in the morning and at bedtime. 04/14/22   Chilton Greathouse, MD    Physical Exam: Vitals:   02/05/24 1938 02/05/24 1950 02/05/24 2038 02/05/24 2053  BP: 113/88  122/81   Pulse: (!) 118  100 91  Resp: (!) 31  20 18   Temp:  99 F (37.2 C) 99.3 F (37.4 C)   TempSrc:  Oral Oral   SpO2: 95%  95% 95%  Weight:       Physical Exam:  General: No acute distress, frail cerebral palsy, severe malnutrition HEENT: Normocephalic, atraumatic, PERRL Cardiovascular: Normal rate and rhythm. Distal pulses intact. Pulmonary: Normal pulmonary effort, normal breath sounds Gastrointestinal: Distended abdomen, non-tender, normoactive bowel sounds, feeding tube present Musculoskeletal:No lower ext edema, contractured Skin: Skin is warm and dry. Neuro: interactive, smiles frequently says a few slurred words, eyes rove frequently no eye contact PSYCH: awake   Data Reviewed:  Results for orders placed or performed during the hospital encounter of 02/05/24 (from the past 24 hours)  Resp panel by RT-PCR (RSV, Flu A&B, Covid) Anterior Nasal Swab     Status: None   Collection Time: 02/05/24  1:06 PM   Specimen: Anterior Nasal Swab  Result Value Ref Range   SARS Coronavirus 2 by  RT PCR NEGATIVE NEGATIVE   Influenza A by PCR NEGATIVE NEGATIVE   Influenza B by PCR NEGATIVE NEGATIVE   Resp Syncytial Virus by PCR NEGATIVE NEGATIVE  CBC with Differential     Status: None   Collection Time: 02/05/24  2:49 PM  Result Value Ref Range   WBC 7.8 4.0 - 10.5 K/uL   RBC 4.48 4.22 - 5.81 MIL/uL   Hemoglobin 14.3 13.0 - 17.0 g/dL   HCT 46.9 62.9 - 52.8 %   MCV 93.3 80.0 - 100.0 fL   MCH 31.9 26.0 - 34.0 pg   MCHC 34.2 30.0 - 36.0 g/dL   RDW 41.3 24.4 - 01.0 %   Platelets 228 150 - 400 K/uL   nRBC 0.0 0.0 - 0.2 %   Neutrophils Relative % 75 %   Neutro Abs 5.9 1.7 - 7.7 K/uL   Lymphocytes Relative 17 %   Lymphs Abs 1.3 0.7 - 4.0 K/uL   Monocytes Relative 6 %   Monocytes Absolute 0.5 0.1 - 1.0 K/uL   Eosinophils Relative 1 %   Eosinophils Absolute 0.0 0.0 - 0.5 K/uL   Basophils Relative 1 %   Basophils Absolute 0.0 0.0 - 0.1 K/uL   Immature Granulocytes 0 %   Abs Immature Granulocytes 0.02 0.00 - 0.07 K/uL  Basic metabolic panel  Status: Abnormal   Collection Time: 02/05/24  2:49 PM  Result Value Ref Range   Sodium 135 135 - 145 mmol/L   Potassium 3.7 3.5 - 5.1 mmol/L   Chloride 98 98 - 111 mmol/L   CO2 25 22 - 32 mmol/L   Glucose, Bld 86 70 - 99 mg/dL   BUN 7 6 - 20 mg/dL   Creatinine, Ser <6.21 (L) 0.61 - 1.24 mg/dL   Calcium 9.0 8.9 - 30.8 mg/dL   GFR, Estimated NOT CALCULATED >60 mL/min   Anion gap 12 5 - 15  Lactic acid, plasma     Status: None   Collection Time: 02/05/24  2:49 PM  Result Value Ref Range   Lactic Acid, Venous 1.0 0.5 - 1.9 mmol/L  Urinalysis, Routine w reflex microscopic -Urine, Clean Catch     Status: Abnormal   Collection Time: 02/05/24  5:23 PM  Result Value Ref Range   Color, Urine YELLOW YELLOW   APPearance HAZY (A) CLEAR   Specific Gravity, Urine 1.020 1.005 - 1.030   pH >=9.0 5.0 - 8.0   Glucose, UA NEGATIVE NEGATIVE mg/dL   Hgb urine dipstick NEGATIVE NEGATIVE   Bilirubin Urine NEGATIVE NEGATIVE   Ketones, ur 40 (A)  NEGATIVE mg/dL   Protein, ur 30 (A) NEGATIVE mg/dL   Nitrite POSITIVE (A) NEGATIVE   Leukocytes,Ua NEGATIVE NEGATIVE  Urinalysis, Microscopic (reflex)     Status: Abnormal   Collection Time: 02/05/24  5:23 PM  Result Value Ref Range   RBC / HPF NONE SEEN 0 - 5 RBC/hpf   WBC, UA 0-5 0 - 5 WBC/hpf   Bacteria, UA MANY (A) NONE SEEN   Squamous Epithelial / HPF 0-5 0 - 5 /HPF   Triple Phosphate Crystal PRESENT    *Note: Due to a large number of results and/or encounters for the requested time period, some results have not been displayed. A complete set of results can be found in Results Review.     Assessment and Plan: Persistent pneumonia in 38 yo w Cerebral Palsy Sepsis  UTI - Merrem per ID recommendation - Increase his home percussion machine to q4 while awake  2. Continue tube feeds     Advance Care Planning:   Code Status: Do not attempt resuscitation (DNR) - Comfort care  Confirmed by the patient's father  Consults: ID  Family Communication: dad at bedside  Severity of Illness: The appropriate patient status for this patient is INPATIENT. Inpatient status is judged to be reasonable and necessary in order to provide the required intensity of service to ensure the patient's safety. The patient's presenting symptoms, physical exam findings, and initial radiographic and laboratory data in the context of their chronic comorbidities is felt to place them at high risk for further clinical deterioration. Furthermore, it is not anticipated that the patient will be medically stable for discharge from the hospital within 2 midnights of admission.   * I certify that at the point of admission it is my clinical judgment that the patient will require inpatient hospital care spanning beyond 2 midnights from the point of admission due to high intensity of service, high risk for further deterioration and high frequency of surveillance required.*  Author: Buena Irish, MD 02/05/2024 11:22  PM  For on call review [email or web address].

## 2024-02-05 NOTE — ED Notes (Signed)
   02/05/24 1305  Respiratory Assessment  $ RT Protocol Assessment  Yes  Assessment Type Pre-treatment  Respiratory Pattern Regular;Dyspnea at rest;Symmetrical  Chest Assessment Chest expansion symmetrical  Cough Productive  Sputum Color Yellow  R Upper  Breath Sounds Rhonchi  L Upper Breath Sounds Rhonchi  R Lower Breath Sounds Rhonchi  L Lower Breath Sounds Rhonchi  Oxygen Therapy/Pulse Ox  O2 Device Room Air  O2 Therapy Room air  SpO2 93 %   Rhonchi t/o R>L.  Suctioning pale yellow secretions.

## 2024-02-05 NOTE — ED Provider Notes (Signed)
 [name]    CSN: [id number] Arrival date & time: 02/05/24  1113      History   Chief Complaint Chief Complaint  Patient presents with   Cough    HPI [name] is a 38 y.o. male.   Patient presents with his mother who provides history given history of cerebral palsy as patient is wheelchair-bound.  Patient resides in a group home.  Reports persistent coughing.  Patient was seen by PCP on 01/20/2024 and had a chest x-ray which was unremarkable and was prescribed antibiotics.  Reports that they have completed a 10-day course of antibiotics and cough has returned.  Denies any chronic lung diseases.  Denies any fever at home.  Denies any obvious known sick contacts.  It appears that PCP was concerned about aspiration pneumonia after dental procedure.  He has also taken Hydromet cough syrup.   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   GERD (gastroesophageal reflux disease)    Hyperlipidemia, mild 08/25/2015   Hyperthyroidism    Incontinence of feces    Loss of weight 08/28/2014   Medicare annual wellness visit, subsequent 08/25/2015   Mildly underweight adult 03/16/2017   Palpitations    PALSY, INFANTILE CEREBRAL, 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   Qualifier: Diagnosis of  By: Janit Bern  Working with PMR Dr Hermelinda Medicus and tolerating Botox injections in hips, shoulders, etc with good results   Skin lesion of right ear 03/16/2017   Thyroid disease 08/01/2010   Qualifier: Diagnosis of  By: Candice Camp CMA (AAMA), Dottie      Patient Active Problem List   Diagnosis Date Noted   Abscess of axilla, left 01/18/2023   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   Agitation 08/18/2022   Insomnia 06/19/2022   Bilateral  knee pain 06/11/2022   Therapeutic drug monitoring 06/11/2022   PVD (peripheral vascular disease) (HCC) 08/10/2021   Close exposure to COVID-19 virus 10/16/2019   Mildly underweight adult 03/16/2017   Skin lesion of right ear 03/16/2017   Increased oropharyngeal secretions 04/14/2016   Medicare annual wellness visit, subsequent 08/25/2015   Hyperlipidemia, mild 08/25/2015   Recurrent aspiration pneumonia (HCC) 12/17/2014   Esophageal dysphagia 09/06/2014   Dysphagia, pharyngoesophageal phase 09/02/2014   Impetigo 09/02/2014   Abnormal thyroid function test 06/10/2014   Protein-calorie malnutrition, severe (HCC) 11/23/2013   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   Cervical dystonia 08/23/2013   Dysphagia, oropharyngeal phase 08/10/2013   Low back pain 07/11/2013   PORTAL VEIN THROMBOSIS 12/12/2010   Anemia 12/05/2010   Other constipation 12/05/2010   KYPHOSIS 11/11/2010   Infantile cerebral palsy (HCC) 08/04/2010   Thyroid disease 08/01/2010   Depression with anxiety 08/01/2010   GERD 08/01/2010   PALPITATIONS 02/25/2009   HIP PAIN, BILATERAL 11/06/2008   Benign neoplasm of skin 10/09/2008   Dysuria 10/09/2008   Incontinence of feces 04/02/2008   Urinary incontinence 04/02/2008   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    HIP SURGERY     x2 , side    IR CM INJ ANY COLONIC TUBE W/FLUORO  05/28/2017   IR CM INJ ANY COLONIC TUBE W/FLUORO  07/07/2019   IR CM INJ ANY COLONIC TUBE W/FLUORO  09/18/2019   IR CM INJ ANY COLONIC TUBE W/FLUORO  05/21/2023   IR GASTR TUBE CONVERT GASTR-JEJ PER  W/FL MOD SED  03/14/2019   IR GENERIC HISTORICAL  07/01/2016   IR GASTR TUBE CONVERT GASTR-JEJ PER W/FL MOD SED 07/01/2016 Irish Lack, MD [facility]   IR GENERIC HISTORICAL  07/08/2016   IR PATIENT EVAL TECH 0-60 MINS 07/08/2016 Irish Lack, MD [facility]   IR GENERIC HISTORICAL  07/14/2016   IR GJ TUBE CHANGE 07/14/2016 Simonne Come, MD [facility]   IR GENERIC HISTORICAL  07/21/2016   IR PATIENT EVAL TECH 0-60 MINS [facility]   IR GENERIC HISTORICAL  08/31/2016   IR REPLC DUODEN/JEJUNO TUBE PERCUT W/FLUORO 08/31/2016 Berdine Dance, MD [facility]   IR GENERIC HISTORICAL  09/03/2016   IR GJ TUBE CHANGE 09/03/2016 Simonne Come, MD [facility]   IR GENERIC HISTORICAL  09/10/2016   IR GASTR TUBE CONVERT GASTR-JEJ PER W/FL MOD SED 09/10/2016 [facility]   IR GENERIC HISTORICAL  09/16/2016   IR PATIENT EVAL TECH 0-60 MINS [facility]   IR GENERIC HISTORICAL  09/29/2016   IR GJ TUBE CHANGE 09/29/2016 Oley Balm, MD [facility]   IR GJ TUBE CHANGE  02/05/2017   IR GJ TUBE CHANGE  05/21/2017   IR GJ TUBE CHANGE  08/25/2017   IR GJ TUBE CHANGE  01/18/2018   IR GJ TUBE CHANGE  02/04/2018   IR GJ TUBE CHANGE  04/21/2018   IR GJ TUBE CHANGE  08/18/2018   IR GJ TUBE CHANGE  09/12/2019   IR GJ TUBE CHANGE  01/03/2020   IR GJ TUBE CHANGE  05/13/2020   IR GJ TUBE CHANGE  09/11/2020   IR GJ TUBE CHANGE  02/27/2021   IR GJ TUBE CHANGE  08/01/2021   IR GJ TUBE CHANGE  12/31/2021   IR GJ TUBE CHANGE  05/11/2022   IR GJ TUBE CHANGE  07/01/2022   IR GJ TUBE CHANGE  01/28/2023   IR MECH REMOV OBSTRUC MAT ANY COLON TUBE W/FLUORO  09/02/2018   IR REPLC GASTRO/COLONIC TUBE PERCUT W/FLUORO  10/17/2018   IR REPLC GASTRO/COLONIC TUBE PERCUT W/FLUORO  04/02/2023   IR REPLC GASTRO/COLONIC TUBE PERCUT W/FLUORO  11/18/2023   PEG PLACEMENT  10/21/2011   Procedure: PERCUTANEOUS ENDOSCOPIC GASTROSTOMY (PEG) REPLACEMENT;  Surgeon: Hart Carwin, MD;  Location: [facility];  Service: Endoscopy;  Laterality: N/A;    PEG PLACEMENT N/A 06/13/2013   Procedure: PERCUTANEOUS ENDOSCOPIC GASTROSTOMY (PEG) REPLACEMENT;  Surgeon: Hart Carwin, MD;  Location: [facility];  Service: Endoscopy;  Laterality: N/A;   SPINAL FUSION     spinal fusion to correct 70 degree kyphosis  11-2010   spinal fusioncorrect 106 degree kyphosis     SPINE SURGERY  ,11/20/2010, 2011   for correction of severe contracturing spinal kyphosis.    TONSILLECTOMY         Home Medications    Prior to Admission medications   Medication Sig Start Date End Date Taking? Authorizing Provider  albuterol (PROVENTIL) (2.5 MG/3ML) 0.083%  nebulizer solution Take 3 mLs (2.5 mg total) by nebulization every 4 (four) hours as needed for wheezing or shortness of breath. 01/20/24   Clayborne Dana, NP  AMBULATORY NON FORMULARY MEDICATION Medication Name: MIC gastrostomy/bolus feeding tube 24 French Part number 0110-24. #2 and 10 cc lurer lock syringe #2 Dx: 10/15/15   Pyrtle, Carie Caddy, MD  dantrolene (DANTRIUM) 100 MG capsule Take 1 capsule (100 mg total) by mouth 3 (three) times daily. 01/12/24   Ranelle Oyster, MD  DEPAKOTE SPRINKLES 125 MG capsule Take by mouth. 06/13/21   [provider]  diazepam (VALIUM) 5 MG tablet Take 5 mg by mouth 2 (two) times daily. 01/30/22   [provider]  doxepin (SINEQUAN) 25 MG capsule Take 25 mg by mouth at bedtime. 04/24/23   [provider]  escitalopram (LEXAPRO) 5 MG/5ML solution Place 20 mLs into feeding tube daily. 03/24/23   [provider]  Incontinence Supply Disposable (PREVAIL BREEZERS MEDIUM) MISC pkg of 16- size medium 32" to 44"  Breathable cloth-like outer fabric (can't use the plastic outer surgace  Item # [id number]/[id number] 07/30/14   Bradd Canary, MD  ketotifen (ZADITOR) 0.035 % ophthalmic solution Place 1 drop into both eyes in the morning and at bedtime. 06/18/23   Alfredia Ferguson, PA-C  lamoTRIgine (LAMICTAL) 25 MG tablet TAKE 2 TABLETS BY MOUTH 2 TIMES DAILY. 08/09/23   Levert Feinstein, MD  LORazepam (ATIVAN) 1 MG tablet Take 1 mg by mouth at bedtime. 02/24/23   [provider]  Misc. Devices (ALL-BODY MASSAGE) MISC 1 Units/hr by Does not apply route as needed. Full body massage for Muscle spasticity due to Cerebral palsy 03/14/19   Bradd Canary, MD  mupirocin ointment Idelle Jo) 2 % Apply to area thin film twice daily if needed 09/02/16   Saguier, Ramon Dredge, PA-C  NON FORMULARY Bard Leg Bag Extension tubing w/Connector 18", Sterile, latex-free  Item# [id number]    [provider]  NON FORMULARY Colorplast Freedom Cath Latex Self-Adhering Male External Catheter 31mm Diameter Intermediate  Item# [id number]    [provider]  NONFORMULARY OR COMPOUNDED Audelia Hives REF 191478 - Kangaroo Joey Pump Set with Flush Bags - 1000 mL 06/04/15   Bradd Canary, MD  Nutritional Supplements (FEEDING SUPPLEMENT, KATE FARMS STANDARD 1.4,) LIQD liquid Take 325 mLs by mouth as directed. 3 carton over 16 hrs    [provider]  OLANZapine (ZYPREXA) 10 MG tablet Take 10 mg by mouth at bedtime. 06/11/22   [provider]  OLANZapine (ZYPREXA) 5 MG tablet Take by mouth. 02/12/21   [provider]  omeprazole (PRILOSEC) 20 MG capsule Take 2 capsules (40 mg total) by mouth daily. 12/27/23   Zehr, Princella Pellegrini, PA-C  Omeprazole-Sodium Bicarbonate (ZEGERID) 20-1100 MG CAPS capsule Take 1 capsule by mouth daily before breakfast.    [provider]  Ostomy Supplies (PROTECTIVE BARRIER WIPES) MISC 1-1/4" X 3"  Item #[id number] 07/30/14   Bradd Canary, MD  oxyBUTYnin (DITROPAN) 5 MG/5ML solution Take 5 mg by mouth 2 (two) times daily. 03/31/23   [provider]  sodium chloride HYPERTONIC 3 % nebulizer solution Take by nebulization in the morning and at bedtime. 04/14/22   Mannam, Colbert Coyer, MD  sucralfate (CARAFATE) 1 g tablet Take 1 tablet (1 g total) by mouth daily. TAKE 1 TABLET BY MOUTH 4 TIMES DAILY WITH MEALS AND AT BEDTIME 10/08/23   Zehr,  Shanda Bumps D, PA-C  traZODone (DESYREL) 150 MG tablet Take  150 mg by mouth at bedtime. 12/31/23   [provider]    Family History Family History  Problem Relation Age of Onset   Asthma Mother    Hyperlipidemia Mother    COPD Mother    Other Mother        bronchial stasis/ABPA   Cancer Maternal Grandmother 80       breast   Hyperlipidemia Maternal Grandmother    Hypertension Maternal Grandmother    Cancer Maternal Grandfather        prostate   Heart disease Paternal Grandfather        CHF   Osteoporosis Paternal Grandmother    Arthritis Paternal Grandmother        rheumatoid    Social History Social History   Tobacco Use   Smoking status: Never   Smokeless tobacco: Never  Vaping Use   Vaping status: Never Used  Substance Use Topics   Alcohol use: No   Drug use: No     Allergies   Ambien [zolpidem tartrate]; Antihistamines, chlorpheniramine-type; Augmentin [amoxicillin-pot clavulanate]; Cefdinir; Codeine; Zolpidem; Baclofen; Metoclopramide; Pheniramine; Sulfa antibiotics; and Sulfonamide derivatives   Review of Systems Review of Systems Per HPI  Physical Exam Triage Vital Signs ED Triage Vitals [02/05/24 1124]  Encounter Vitals Group     BP (!) 128/97     Systolic BP Percentile      Diastolic BP Percentile      Pulse Rate (!) 107     Resp (!) 32     Temp 99.5 F (37.5 C)     Temp Source Temporal     SpO2 94 %     Weight      Height      Head Circumference      Peak Flow      Pain Score      Pain Loc      Pain Education      Exclude from Growth Chart    No data found.  Updated Vital Signs BP (!) 128/97 (BP Location: Left Arm)   Pulse (!) 107   Temp 99.5 F (37.5 C) (Temporal)   Resp (!) 32   SpO2 94%   Visual Acuity Right Eye Distance:   Left Eye Distance:   Bilateral Distance:    Right Eye Near:   Left Eye Near:    Bilateral Near:     Physical Exam Constitutional:      General: He is not in acute distress.    Appearance:  Normal appearance. He is ill-appearing. He is not toxic-appearing or diaphoretic.  HENT:     Head: Normocephalic and atraumatic.  Eyes:     Extraocular Movements: Extraocular movements intact.     Conjunctiva/sclera: Conjunctivae normal.  Cardiovascular:     Rate and Rhythm: Regular rhythm. Tachycardia present.     Pulses: Normal pulses.     Heart sounds: Normal heart sounds.  Pulmonary:     Effort: No respiratory distress.     Breath sounds: No stridor. Rhonchi present. No wheezing or rales.     Comments: Mildly tachpneic.  Neurological:     General: No focal deficit present.     Mental Status: He is alert and oriented to person, place, and time. Mental status is at baseline.  Psychiatric:        Mood and Affect: Mood normal.        Behavior: Behavior normal.        Thought Content: Thought content normal.  Judgment: Judgment normal.      UC Treatments / Results  Labs (all labs ordered are listed, but only abnormal results are displayed) Labs Reviewed - No data to display  EKG   Radiology No results found.  Procedures Procedures (including critical [name] time)  Medications Ordered in UC Medications - No data to display  Initial Impression / Assessment and Plan / UC Course  I have reviewed the triage vital signs and the nursing notes.  Pertinent labs & imaging results that were available during my [name] of the patient were reviewed by me and considered in my medical decision making (see chart for details).     Patient has extensive rhonchi and adventitious lung sounds on exam that can be heard without auscultation.  I do think that suction is necessary which cannot be provided here in urgent [name] given limited resources.  Patient may also need more advanced chest imaging as well which also cannot be provided here at urgent [name].  Oxygen is also borderline with very mild tachypnea.  Advised parent to take him to the hospital for further evaluation and management.   Parent was agreeable with plan and wished to self transport patient. Final Clinical Impressions(s) / UC Diagnoses   Final diagnoses:  None   Discharge Instructions   None    ED Prescriptions   None    PDMP not reviewed this encounter.   Gustavus Bryant, [place] 02/05/24 1218

## 2024-02-05 NOTE — ED Notes (Addendum)
 Patsy Lager , RN at [facility] 2W receiving nurse unavailable. Charge nurse received report.

## 2024-02-05 NOTE — ED Notes (Signed)
 Pt mother called out stating he felt warm. Rechecked axillary temp and has fever of 101.21f. Provider aware.

## 2024-02-05 NOTE — ED Notes (Signed)
 Patient transported to CT

## 2024-02-05 NOTE — Progress Notes (Signed)
 Pharmacy Antibiotic Note  [name] is a 38 y.o. male admitted on 02/05/2024 presenting with pna, tx PTA with Vantin, hx of cerebral palsy, per admitting MD ID had recommended Merrem.  Pharmacy has been consulted for Merrem dosing.  Plan: Merrem 1g IV q 8h Monitor renal function, Cx and ID recs to narrow  Weight: 43.1 kg (95 lb 0.3 oz)  Temp (24hrs), Avg:99.7 F (37.6 C), Min:98.5 F (36.9 C), Max:101.3 F (38.5 C)  Recent Labs  Lab 02/05/24 1449  WBC 7.8  CREATININE <0.30*  LATICACIDVEN 1.0    CrCl cannot be calculated (This lab value cannot be used to calculate CrCl because it is not a number: <0.30).    Allergies  Allergen Reactions   Ambien [Zolpidem Tartrate] Nausea Only   Antihistamines, Chlorpheniramine-Type     Other reaction(s): Other (See Comments) Other Reaction: agitation   Augmentin [Amoxicillin-Pot Clavulanate] Diarrhea   Cefdinir Diarrhea    Not tolerate well and cause diarrhea   Codeine Other (See Comments)    Makes patient too active after a few days.   Zolpidem     Other Reaction(s): GI Intolerance   Baclofen Anxiety and Swelling    anxiety   Metoclopramide Other (See Comments) and Swelling    Delusion, emotionality  Other Reaction(s): Mental Status Changes   Pheniramine Rash    Other reaction(s): Other (See Comments)  Other Reaction: agitation  Other Reaction(s): Other (See Comments)  Other reaction(s): Other (See Comments), Other Reaction: agitation   Sulfa Antibiotics Rash   Sulfonamide Derivatives Rash    Daylene Posey, PharmD, [facility] Clinical Pharmacist ED Pharmacist Phone # [phone] 02/05/2024 7:57 PM

## 2024-02-05 NOTE — ED Triage Notes (Signed)
 Pt brought in by family from his group home. States he has had productive cough. He recently finished a 10 day course of antibiotics prescribed by his PCP. Symptoms resumed a few days after completion of antibiotics. Pt coughing continuously. Family states they suction him at home

## 2024-02-05 NOTE — ED Notes (Signed)
 Patients urine cloudy, dark and malodorous. Requested order for urinalysis

## 2024-02-05 NOTE — ED Notes (Signed)
 Pt sitting up in wheelchair with family present

## 2024-02-05 NOTE — ED Notes (Signed)
 ED TO INPATIENT HANDOFF REPORT  ED Nurse Name and Phone #: Hayden Pedro Rn [phone]  S Name/Age/Gender [name] 38 y.o. male Room/Bed: MH11/MH11  Code Status   Code Status: Prior  Home/SNF/Other Home Patient oriented to: self and situation  Is this baseline? Yes y  Triage Complete: Triage complete  Chief Complaint Sepsis [facility]) [A41.9]  Triage Note No notes on file   Allergies Allergies  Allergen Reactions   Ambien [Zolpidem Tartrate] Nausea Only   Antihistamines, Chlorpheniramine-Type     Other reaction(s): Other (See Comments) Other Reaction: agitation   Augmentin [Amoxicillin-Pot Clavulanate] Diarrhea   Cefdinir Diarrhea    Not tolerate well and cause diarrhea   Codeine Other (See Comments)    Makes patient too active after a few days.   Zolpidem     Other Reaction(s): GI Intolerance   Baclofen Anxiety and Swelling    anxiety   Metoclopramide Other (See Comments) and Swelling    Delusion, emotionality  Other Reaction(s): Mental Status Changes   Pheniramine Rash    Other reaction(s): Other (See Comments)  Other Reaction: agitation  Other Reaction(s): Other (See Comments)  Other reaction(s): Other (See Comments), Other Reaction: agitation   Sulfa Antibiotics Rash   Sulfonamide Derivatives Rash    Level of Care/Admitting Diagnosis ED Disposition     ED Disposition  Admit   Condition  --   Comment  Hospital Area: [facility] [[id number]]  Level of Care: Telemetry Medical [[id number]]  May [facility] patient to [facility] or [facility] if equivalent level of care is available:: Yes  Interfacility transfer: Yes  Covid Evaluation: Confirmed COVID Negative  Diagnosis: Sepsis [facility]) [[id number]]  Admitting Physician: Buena Irish [[id number]]  Attending Physician: Buena Irish [[id number]]  Certification:: I certify this patient will need inpatient services for at least 2 midnights  Expected Medical Readiness: 02/08/2024           B Medical/Surgery History Past Medical History:  Diagnosis Date   Cerebral palsy (HCC)    Dehydration 11/22/2013   Depression with anxiety 08/01/2010   Qualifier: Diagnosis of  By: Nelson-Smith CMA (AAMA), Dottie     Dyslipidemia 08/19/2017   Esophagitis 2011   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   GERD (gastroesophageal reflux disease)    Hyperlipidemia, mild 08/25/2015   Hyperthyroidism    Incontinence of feces    Loss of weight 08/28/2014   Medicare annual wellness visit, subsequent 08/25/2015   Mildly underweight adult 03/16/2017   Palpitations    PALSY, INFANTILE CEREBRAL, 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   Qualifier: Diagnosis of  By: Janit Bern  Working with PMR Dr Hermelinda Medicus and tolerating Botox injections in hips, shoulders, etc with good results   Skin lesion of right ear 03/16/2017   Thyroid disease 08/01/2010   Qualifier: Diagnosis of  By: Candice Camp CMA (AAMA), Dottie     Past Surgical History:  Procedure Laterality Date   baclofen trial     baslofen pump implant     ears tubes     EYE SURGERY     FLEXIBLE SIGMOIDOSCOPY N/A 09/07/2014   Procedure: FLEXIBLE SIGMOIDOSCOPY;  Surgeon: Beverley Fiedler, MD;  Location: [facility] ENDOSCOPY;  Service: Endoscopy;  Laterality: N/A;   g-tube insert  August 2006   hamstring released     to treat contractures.    HIP SURGERY     x2 , side    IR CM INJ ANY COLONIC TUBE W/FLUORO  05/28/2017   IR CM INJ ANY COLONIC TUBE  W/FLUORO  07/07/2019   IR CM INJ ANY COLONIC TUBE W/FLUORO  09/18/2019   IR CM INJ ANY COLONIC TUBE W/FLUORO  05/21/2023   IR GASTR TUBE CONVERT GASTR-JEJ PER W/FL MOD SED  03/14/2019   IR GENERIC HISTORICAL  07/01/2016   IR GASTR TUBE CONVERT GASTR-JEJ PER W/FL MOD SED 07/01/2016 Irish Lack, MD [facility]   IR GENERIC HISTORICAL  07/08/2016   IR PATIENT EVAL TECH 0-60 MINS 07/08/2016 Irish Lack, MD [facility]   IR GENERIC HISTORICAL  07/14/2016   IR GJ TUBE CHANGE 07/14/2016 Simonne Come, MD [facility]   IR GENERIC  HISTORICAL  07/21/2016   IR PATIENT EVAL TECH 0-60 MINS [facility]   IR GENERIC HISTORICAL  08/31/2016   IR REPLC DUODEN/JEJUNO TUBE PERCUT W/FLUORO 08/31/2016 Berdine Dance, MD [facility]   IR GENERIC HISTORICAL  09/03/2016   IR GJ TUBE CHANGE 09/03/2016 Simonne Come, MD [facility]   IR GENERIC HISTORICAL  09/10/2016   IR GASTR TUBE CONVERT GASTR-JEJ PER W/FL MOD SED 09/10/2016 [facility]   IR GENERIC HISTORICAL  09/16/2016   IR PATIENT EVAL TECH 0-60 MINS [facility]   IR GENERIC HISTORICAL  09/29/2016   IR GJ TUBE CHANGE 09/29/2016 Oley Balm, MD [facility]   IR GJ TUBE CHANGE  02/05/2017   IR GJ TUBE CHANGE  05/21/2017   IR GJ TUBE CHANGE  08/25/2017   IR GJ TUBE CHANGE  01/18/2018   IR GJ TUBE CHANGE  02/04/2018   IR GJ TUBE CHANGE  04/21/2018   IR GJ TUBE CHANGE  08/18/2018   IR GJ TUBE CHANGE  09/12/2019   IR GJ TUBE CHANGE  01/03/2020   IR GJ TUBE CHANGE  05/13/2020   IR GJ TUBE CHANGE  09/11/2020   IR GJ TUBE CHANGE  02/27/2021   IR GJ TUBE CHANGE  08/01/2021   IR GJ TUBE CHANGE  12/31/2021   IR GJ TUBE CHANGE  05/11/2022   IR GJ TUBE CHANGE  07/01/2022   IR GJ TUBE CHANGE  01/28/2023   IR MECH REMOV OBSTRUC MAT ANY COLON TUBE W/FLUORO  09/02/2018   IR REPLC GASTRO/COLONIC TUBE PERCUT W/FLUORO  10/17/2018   IR REPLC GASTRO/COLONIC TUBE PERCUT W/FLUORO  04/02/2023   IR REPLC GASTRO/COLONIC TUBE PERCUT W/FLUORO  11/18/2023   PEG PLACEMENT  10/21/2011   Procedure: PERCUTANEOUS ENDOSCOPIC GASTROSTOMY (PEG) REPLACEMENT;  Surgeon: Hart Carwin, MD;  Location: [facility] ENDOS[facility];  Service: Endoscopy;  Laterality: N/A;   PEG PLACEMENT N/A 06/13/2013   Procedure: PERCUTANEOUS ENDOSCOPIC GASTROSTOMY (PEG) REPLACEMENT;  Surgeon: Hart Carwin, MD;  Location: [facility] ENDOS[facility];  Service: Endoscopy;  Laterality: N/A;   SPINAL FUSION     spinal fusion to correct 70 degree kyphosis  11-2010   spinal fusioncorrect 106 degree kyphosis     SPINE SURGERY  ,11/20/2010, 2011   for correction of severe  contracturing spinal kyphosis.    TONSILLECTOMY       A IV Location/Drains/Wounds Patient Lines/Drains/Airways Status     Active Line/Drains/Airways     Name Placement date Placement time Site Days   Peripheral IV 02/05/24 20 G Anterior;Left Forearm 02/05/24  1455  Forearm  less than 1   Gastrostomy/Enterostomy PEG-jejunostomy 24 Fr. LUQ 04/02/23  1525  LUQ  309            Intake/Output Last 24 hours  Intake/Output Summary (Last 24 hours) at 02/05/2024 1929 Last data filed at 02/05/2024 1830 Gross per 24 hour  Intake  2352.09 ml  Output 800 ml  Net 1552.09 ml    Labs/Imaging Results for orders placed or performed during the hospital encounter of 02/05/24 (from the past 48 hours)  Resp panel by RT-PCR (RSV, Flu A&B, Covid) Anterior Nasal Swab     Status: None   Collection Time: 02/05/24  1:06 PM   Specimen: Anterior Nasal Swab  Result Value Ref Range   SARS Coronavirus 2 by RT PCR NEGATIVE NEGATIVE    Comment: (NOTE) SARS-CoV-2 target nucleic acids are NOT DETECTED.  The SARS-CoV-2 RNA is generally detectable in upper respiratory specimens during the acute phase of infection. The lowest concentration of SARS-CoV-2 viral copies this assay can detect is 138 copies/mL. A negative result does not preclude SARS-Cov-2 infection and should not be used as the sole basis for treatment or other patient management decisions. A negative result may occur with  improper specimen collection/handling, submission of specimen other than nasopharyngeal swab, presence of viral mutation(s) within the areas targeted by this assay, and inadequate number of viral copies(<138 copies/mL). A negative result must be combined with clinical observations, patient history, and epidemiological information. The expected result is Negative.  Fact Sheet for Patients:  BloggerCourse.com  Fact Sheet for Healthcare Providers:  SeriousBroker.it  This test  is no t yet approved or cleared by the [place] FDA and  has been authorized for detection and/or diagnosis of SARS-CoV-2 by FDA under an Emergency Use Authorization (EUA). This EUA will remain  in effect (meaning this test can be used) for the duration of the COVID-19 declaration under Section 564(b)(1) of the Act, 21 U.S.C.section 360bbb-3(b)(1), unless the authorization is terminated  or revoked sooner.       Influenza A by PCR NEGATIVE NEGATIVE   Influenza B by PCR NEGATIVE NEGATIVE    Comment: (NOTE) The Xpert Xpress SARS-CoV-2/FLU/RSV plus assay is intended as an aid in the diagnosis of influenza from Nasopharyngeal swab specimens and should not be used as a sole basis for treatment. Nasal washings and aspirates are unacceptable for Xpert Xpress SARS-CoV-2/FLU/RSV testing.  Fact Sheet for Patients: BloggerCourse.com  Fact Sheet for Healthcare Providers: SeriousBroker.it  This test is not yet approved or cleared by the [place] FDA and has been authorized for detection and/or diagnosis of SARS-CoV-2 by FDA under an Emergency Use Authorization (EUA). This EUA will remain in effect (meaning this test can be used) for the duration of the COVID-19 declaration under Section 564(b)(1) of the Act, 21 U.S.C. section 360bbb-3(b)(1), unless the authorization is terminated or revoked.     Resp Syncytial Virus by PCR NEGATIVE NEGATIVE    Comment: (NOTE) Fact Sheet for Patients: BloggerCourse.com  Fact Sheet for Healthcare Providers: SeriousBroker.it  This test is not yet approved or cleared by the [place] FDA and has been authorized for detection and/or diagnosis of SARS-CoV-2 by FDA under an Emergency Use Authorization (EUA). This EUA will remain in effect (meaning this test can be used) for the duration of the COVID-19 declaration under Section 564(b)(1) of the  Act, 21 U.S.C. section 360bbb-3(b)(1), unless the authorization is terminated or revoked.  Performed at [facility], [address] [place], [place] [address]   CBC with Differential     Status: None   Collection Time: 02/05/24  2:49 PM  Result Value Ref Range   WBC 7.8 4.0 - 10.5 K/uL   RBC 4.48 4.22 - 5.81 MIL/uL   Hemoglobin 14.3 13.0 - 17.0 g/dL   HCT 60.4 54.0 -  52.0 %   MCV 93.3 80.0 - 100.0 fL   MCH 31.9 26.0 - 34.0 pg   MCHC 34.2 30.0 - 36.0 g/dL   RDW 16.1 09.6 - 04.5 %   Platelets 228 150 - 400 K/uL   nRBC 0.0 0.0 - 0.2 %   Neutrophils Relative % 75 %   Neutro Abs 5.9 1.7 - 7.7 K/uL   Lymphocytes Relative 17 %   Lymphs Abs 1.3 0.7 - 4.0 K/uL   Monocytes Relative 6 %   Monocytes Absolute 0.5 0.1 - 1.0 K/uL   Eosinophils Relative 1 %   Eosinophils Absolute 0.0 0.0 - 0.5 K/uL   Basophils Relative 1 %   Basophils Absolute 0.0 0.0 - 0.1 K/uL   Immature Granulocytes 0 %   Abs Immature Granulocytes 0.02 0.00 - 0.07 K/uL    Comment: Performed at [facility], [address] [place], [place] [address]  Basic metabolic panel     Status: Abnormal   Collection Time: 02/05/24  2:49 PM  Result Value Ref Range   Sodium 135 135 - 145 mmol/L   Potassium 3.7 3.5 - 5.1 mmol/L   Chloride 98 98 - 111 mmol/L   CO2 25 22 - 32 mmol/L   Glucose, Bld 86 70 - 99 mg/dL    Comment: Glucose reference range applies only to samples taken after fasting for at least 8 hours.   BUN 7 6 - 20 mg/dL   Creatinine, Ser <1.91 (L) 0.61 - 1.24 mg/dL   Calcium 9.0 8.9 - 47.8 mg/dL   GFR, Estimated NOT CALCULATED >60 mL/min    Comment: (NOTE) Calculated using the CKD-EPI Creatinine Equation (2021)    Anion gap 12 5 - 15    Comment: Performed at [facility], [address] [place], [place] [address]  Lactic acid, plasma     Status: None   Collection Time: 02/05/24  2:49 PM  Result Value Ref Range   Lactic Acid, Venous 1.0 0.5 - 1.9 mmol/L    Comment: Performed  at [facility], [address] [place], [place] [address]  Urinalysis, Routine w reflex microscopic -Urine, Clean Catch     Status: Abnormal   Collection Time: 02/05/24  5:23 PM  Result Value Ref Range   Color, Urine YELLOW YELLOW   APPearance HAZY (A) CLEAR   Specific Gravity, Urine 1.020 1.005 - 1.030   pH >=9.0 5.0 - 8.0   Glucose, UA NEGATIVE NEGATIVE mg/dL   Hgb urine dipstick NEGATIVE NEGATIVE   Bilirubin Urine NEGATIVE NEGATIVE   Ketones, ur 40 (A) NEGATIVE mg/dL   Protein, ur 30 (A) NEGATIVE mg/dL   Nitrite POSITIVE (A) NEGATIVE   Leukocytes,Ua NEGATIVE NEGATIVE    Comment: Performed at [facility], [address] [place], [place] [address]  Urinalysis, Microscopic (reflex)     Status: Abnormal   Collection Time: 02/05/24  5:23 PM  Result Value Ref Range   RBC / HPF NONE SEEN 0 - 5 RBC/hpf   WBC, UA 0-5 0 - 5 WBC/hpf   Bacteria, UA MANY (A) NONE SEEN   Squamous Epithelial / HPF 0-5 0 - 5 /HPF   Triple Phosphate Crystal PRESENT     Comment: Performed at [facility], [address] [place], [place] [address]   *Note: Due to a large number of results and/or encounters for the requested time period, some results have not been displayed. A  complete set of results can be found in Results Review.   CT Angio Chest PE W and/or Wo Contrast Result Date: 02/05/2024 CLINICAL DATA:  Pulmonary embolism (PE) suspected, high prob Cough. EXAM: CT ANGIOGRAPHY CHEST WITH CONTRAST TECHNIQUE: Multidetector CT imaging of the chest was performed using the standard protocol during bolus administration of intravenous contrast. Multiplanar CT image reconstructions and MIPs were obtained to evaluate the vascular anatomy. RADIATION DOSE REDUCTION: This exam was performed according to the departmental dose-optimization program which includes automated exposure control, adjustment of the mA and/or kV according to patient size and/or use of iterative reconstruction  technique. CONTRAST:  75mL OMNIPAQUE IOHEXOL 350 MG/ML SOLN COMPARISON:  Radiograph earlier today.  Chest CT 08/06/2023 FINDINGS: Cardiovascular: There are no filling defects within the pulmonary arteries to suggest pulmonary embolus. Thoracic aorta is normal in caliber without acute aortic findings. The heart is normal in size. No significant pericardial effusion. Mediastinum/Nodes: No mediastinal or hilar adenopathy. Decompressed esophagus. No thyroid nodule. Lungs/Pleura: Dependent mucus/debris within the trachea. There is subtotal filling of the right bronchus intermedius. Consolidation throughout the right lower lobe, increased from prior exam. This likely represents acute airspace disease superimposed on chronic round atelectasis. Additionally there is patchy ground-glass and tree-in-bud opacity in the right middle lobe. Small right pleural effusion. The left lung is clear. Upper Abdomen: No acute findings. Musculoskeletal: Scoliosis post spinal fixation. Pectus excavatum deformity of the thorax. No acute osseous findings. Small catheter canal with tip at the upper thoracic spine. No acute osseous findings. Review of the MIP images confirms the above findings. IMPRESSION: 1. No pulmonary embolus. 2. Consolidation throughout the right lower lobe, increased from prior exam. There is also retained mucus/debris within the trachea and right bronchus intermedius. Favor acute airspace disease/pneumonia (including aspiration) superimposed on chronic round atelectasis. 3. Patchy ground-glass and tree-in-bud opacity in the right middle lobe, likely infectious/inflammatory. 4. Small right pleural effusion. Electronically Signed   By: Narda Rutherford M.D.   On: 02/05/2024 17:33   DG Chest Portable 1 View Result Date: 02/05/2024 CLINICAL DATA:  Cough. EXAM: PORTABLE CHEST 1 VIEW COMPARISON:  Chest radiograph dated 01/20/2024. FINDINGS: Stable cardiomediastinal silhouette. Similar retained catheter or lead projecting  over the right chest wall. Unchanged right lower lobe density, present since 2016, compatible with rounded atelectasis. No new focal consolidation, sizeable pleural effusion, or pneumothorax. Prior thoracolumbar fusion. No acute osseous abnormality. IMPRESSION: No acute cardiopulmonary findings. Electronically Signed   By: Hart Robinsons M.D.   On: 02/05/2024 14:49    Pending Labs Unresulted Labs (From admission, onward)     Start     Ordered   02/05/24 1422  Blood culture (routine x 2)  BLOOD CULTURE X 2,   STAT      02/05/24 1422            Vitals/Pain Today's Vitals   02/05/24 1730 02/05/24 1815 02/05/24 1900 02/05/24 1901  BP: (!) 115/90 107/65 (!) 127/94   Pulse: (!) 116 (!) 121 (!) 114   Resp: (!) 32 (!) 29 (!) 35   Temp:    99.3 F (37.4 C)  TempSrc:    Axillary  SpO2: 96% 93% 91%   Weight:        Isolation Precautions No active isolations  Medications Medications  dantrolene (DANTRIUM) capsule 100 mg (100 mg Oral Not Given 02/05/24 1833)  traZODone (DESYREL) tablet 150 mg (has no administration in time range)  sucralfate (CARAFATE) tablet 1 g (1 g Oral Not Given 02/05/24 1834)  OLANZapine (ZYPREXA) tablet 5 mg (5 mg Oral Not Given 02/05/24 1834)  OLANZapine (ZYPREXA) tablet 10 mg (has no administration in time range)  LORazepam (ATIVAN) tablet 1 mg (has no administration in time range)  lamoTRIgine (LAMICTAL) tablet 50 mg (has no administration in time range)  escitalopram (LEXAPRO) 5 MG/5ML solution 20 mg (has no administration in time range)  divalproex (DEPAKOTE) DR tablet 375 mg (has no administration in time range)  divalproex (DEPAKOTE) DR tablet 500 mg (has no administration in time range)  lactated ringers infusion ( Intravenous New Bag/Given 02/05/24 1815)  albuterol (PROVENTIL) (2.5 MG/3ML) 0.083% nebulizer solution 2.5 mg (has no administration in time range)  lactated ringers bolus 2,000 mL ( Intravenous Stopped 02/05/24 1601)  ipratropium-albuterol (DUONEB)  0.5-2.5 (3) MG/3ML nebulizer solution 3 mL (3 mLs Nebulization Given by Other 02/05/24 1551)  cefTRIAXone (ROCEPHIN) 1 g in sodium chloride 0.9 % 100 mL IVPB (0 g Intravenous Stopped 02/05/24 1650)  azithromycin (ZITHROMAX) 500 mg in sodium chloride 0.9 % 250 mL IVPB (0 mg Intravenous Stopped 02/05/24 1812)  acetaminophen (TYLENOL) 160 MG/5ML solution 1,000 mg (1,000 mg Per Tube Given 02/05/24 1552)  iohexol (OMNIPAQUE) 350 MG/ML injection 75 mL (75 mLs Intravenous Contrast Given 02/05/24 1709)    Mobility manual wheelchair     Focused Assessments Pulmonary Assessment Handoff:  Lung sounds: Bilateral Breath Sounds: Rhonchi L Breath Sounds: Rhonchi R Breath Sounds: Rhonchi O2 Device: Room Air      R Recommendations: See Admitting Provider Note  Report given to:   Additional Notes:

## 2024-02-06 DIAGNOSIS — A419 Sepsis, unspecified organism: Secondary | ICD-10-CM | POA: Diagnosis not present

## 2024-02-06 LAB — BASIC METABOLIC PANEL WITH GFR
Anion gap: 12 (ref 5–15)
BUN: 6 mg/dL (ref 6–20)
CO2: 22 mmol/L (ref 22–32)
Calcium: 8.5 mg/dL — ABNORMAL LOW (ref 8.9–10.3)
Chloride: 102 mmol/L (ref 98–111)
Creatinine, Ser: <0.3 mg/dL — ABNORMAL LOW (ref 0.61–1.24)
Glucose, Bld: 115 mg/dL — ABNORMAL HIGH (ref 70–99)
Potassium: 3.9 mmol/L (ref 3.5–5.1)
Sodium: 136 mmol/L (ref 135–145)

## 2024-02-06 LAB — BLOOD CULTURE ID PANEL (REFLEXED) - BCID2

## 2024-02-06 LAB — CBC
HCT: 36.3 % — ABNORMAL LOW (ref 39.0–52.0)
Hemoglobin: 12.2 g/dL — ABNORMAL LOW (ref 13.0–17.0)
MCH: 31.9 pg (ref 26.0–34.0)
MCHC: 33.6 g/dL (ref 30.0–36.0)
MCV: 95 fL (ref 80.0–100.0)
Platelets: 175 10*3/uL (ref 150–400)
RBC: 3.82 MIL/uL — ABNORMAL LOW (ref 4.22–5.81)
RDW: 12.4 % (ref 11.5–15.5)
WBC: 3.6 10*3/uL — ABNORMAL LOW (ref 4.0–10.5)
nRBC: 0 % (ref 0.0–0.2)

## 2024-02-06 LAB — PROCALCITONIN: Procalcitonin: 0.11 ng/mL

## 2024-02-06 LAB — TSH: TSH: 1.395 u[IU]/mL (ref 0.350–4.500)

## 2024-02-06 MED ORDER — OXYBUTYNIN CHLORIDE 5 MG/5ML PO SOLN
7.5000 mg | Freq: Every day | ORAL | Status: DC
  Administered 2024-02-06 – 2024-02-08 (×3): 7.5 mg
  Filled 2024-02-06 (×4): qty 7.5

## 2024-02-06 MED ORDER — ALBUTEROL SULFATE (2.5 MG/3ML) 0.083% IN NEBU
2.5000 mg | INHALATION_SOLUTION | Freq: Four times a day (QID) | RESPIRATORY_TRACT | Status: DC
  Administered 2024-02-06 – 2024-02-09 (×9): 2.5 mg via RESPIRATORY_TRACT
  Filled 2024-02-06 (×11): qty 3

## 2024-02-06 MED ORDER — DIAZEPAM 5 MG PO TABS
5.0000 mg | ORAL_TABLET | Freq: Every day | ORAL | Status: DC | PRN
  Administered 2024-02-06: 5 mg
  Filled 2024-02-06: qty 1

## 2024-02-06 MED ORDER — KATE FARMS STANDARD 1.4 PO LIQD
975.0000 mL | ORAL | Status: DC
  Administered 2024-02-06: 975 mL
  Filled 2024-02-06 (×2): qty 975

## 2024-02-06 MED ORDER — DOXEPIN HCL 25 MG PO CAPS
25.0000 mg | ORAL_CAPSULE | Freq: Every day | ORAL | Status: DC
  Administered 2024-02-06 – 2024-02-08 (×3): 25 mg
  Filled 2024-02-06 (×3): qty 1

## 2024-02-06 NOTE — Plan of Care (Signed)

## 2024-02-06 NOTE — Progress Notes (Signed)
 PHARMACY - PHYSICIAN COMMUNICATION CRITICAL VALUE ALERT - BLOOD CULTURE IDENTIFICATION (BCID)  [name] is an 38 y.o. male who presented to [facility] on 02/05/2024 with a chief complaint of fever and cough.   Assessment:  37yo with cerebral palsy, s/p pneumonia treated with cefpodoxime x10days  Currently on Meropenem 1gm IV q8h This would cover staph species - changes to therapy can be discussed on am rounds   Name of physician (or Provider) Contacted: none  Leota Sauers Pharm.D. CPP, BCPS Clinical Pharmacist [phone] 02/06/2024 6:52 PM    Results for orders placed or performed during the hospital encounter of 02/05/24  Blood Culture ID Panel (Reflexed) (Collected: 02/05/2024  2:40 PM)  Result Value Ref Range   Enterococcus faecalis NOT DETECTED NOT DETECTED   Enterococcus Faecium NOT DETECTED NOT DETECTED   Listeria monocytogenes NOT DETECTED NOT DETECTED   Staphylococcus species DETECTED (A) NOT DETECTED   Staphylococcus aureus (BCID) NOT DETECTED NOT DETECTED   Staphylococcus epidermidis NOT DETECTED NOT DETECTED   Staphylococcus lugdunensis NOT DETECTED NOT DETECTED   Streptococcus species NOT DETECTED NOT DETECTED   Streptococcus agalactiae NOT DETECTED NOT DETECTED   Streptococcus pneumoniae NOT DETECTED NOT DETECTED   Streptococcus pyogenes NOT DETECTED NOT DETECTED   A.calcoaceticus-baumannii NOT DETECTED NOT DETECTED   Bacteroides fragilis NOT DETECTED NOT DETECTED   Enterobacterales NOT DETECTED NOT DETECTED   Enterobacter cloacae complex NOT DETECTED NOT DETECTED   Escherichia coli NOT DETECTED NOT DETECTED   Klebsiella aerogenes NOT DETECTED NOT DETECTED   Klebsiella oxytoca NOT DETECTED NOT DETECTED   Klebsiella pneumoniae NOT DETECTED NOT DETECTED   Proteus species NOT DETECTED NOT DETECTED   Salmonella species NOT DETECTED NOT DETECTED   Serratia marcescens NOT DETECTED NOT DETECTED   Haemophilus influenzae NOT DETECTED NOT DETECTED   Neisseria  meningitidis NOT DETECTED NOT DETECTED   Pseudomonas aeruginosa NOT DETECTED NOT DETECTED   Stenotrophomonas maltophilia NOT DETECTED NOT DETECTED   Candida albicans NOT DETECTED NOT DETECTED   Candida auris NOT DETECTED NOT DETECTED   Candida glabrata NOT DETECTED NOT DETECTED   Candida krusei NOT DETECTED NOT DETECTED   Candida parapsilosis NOT DETECTED NOT DETECTED   Candida tropicalis NOT DETECTED NOT DETECTED   Cryptococcus neoformans/gattii NOT DETECTED NOT DETECTED

## 2024-02-06 NOTE — Progress Notes (Signed)
 PROGRESS NOTE  [name]  DOB: [birth year]  PCP: Bradd Canary, MD ZOX:[id number]  DOA: 02/05/2024  LOS: 1 day  Hospital Day: 2  Brief narrative: [name] is a 38 y.o. male with PMH significant for cerebral palsy and spastic quadriplegia, also has hyperthyroidism, hyperlipidemia depression.   The patient was cared for at home for almost 39 years and has been in an assisted living facility for the last 3 months.  3 weeks ago, he had a dental procedure.  Postoperatively, he had fever and persistent cough.  Chest X-ray suspected aspiration pneumonia and he completed 10-day course of Vantin.  Repeat chest x-ray showed improvement.  However, in the 5 days since he has been off antibiotics, he has continued to cough and developed a fever and as he was brought to ED on 4/5.  In the ED, patient had a fever of 101.3, tachycardic to 100s, blood pressure in normal range, breathing on room air. Initial labs with unremarkable CBC, unremarkable BMP, lactic acid normal Respiratory virus panel unremarkable. Urinalysis showed hazy yellow urine with negative leukocytes, positive nitrite, many bacteria Blood culture was sent CT angio of chest did not show any pulm embolism.  It showed  -consolidation throughout the right lower lobe, increased from prior exam from October 2024.  -retained mucus/debris within the trachea and right bronchus intermedius -suspicious of pneumonia (including aspiration) superimposed on chronic round atelectasis. -Patchy ground-glass and tree-in-bud opacity in the right middle lobe, likely infectious/inflammatory.  Patient was started on IV meropenem Admitted to [facility]  Subjective: Patient was seen and examined this morning. Propped up in bed.  Has baseline physical deformities and mental disability Alert, awake.  Seems to be comprehending in limited capacity with this dad at bedside Chart reviewed. overnight, temperature remained less than 100, blood pressure in low  normal range, tachycardia improved to 90s, breathing on room air No repeat labs this morning  Assessment and plan: Sepsis POA Presented with persistent fever despite completion of a course of antibiotics Workup suggested right lower lobe pneumonia as well as UTI Blood culture sent Was started on IV meropenem Fever improving.  WBC count remains normal Continue to monitor Recent Labs  Lab 02/05/24 1449  WBC 7.8  LATICACIDVEN 1.0   Right lower lobe pneumonia Recently completed 10-day course of Vantin despite which continued to have fever Past sputum culture from 2023 grew Pseudomonas and strep agalactiae Sent sputum culture  UTI Urinalysis with positive nitrite Antibiotics as above   Chronic atelectasis Noted to have rounded atelectasis in the posterior right lower lobe and chest imaging since 2016.  Cerebral palsy and spastic quadriplegia PTA meds- Depakote, doxepin, Zyprexa, dantrolene, Lexapro, Lamictal, Ativan,Trazodone Resume all meds Supportive care PEG tube for feeding Continue chest vest while awake    Mobility: Electric power chair bound  Goals of care   Code Status: Do not attempt resuscitation (DNR) - Comfort care     DVT prophylaxis:  enoxaparin (LOVENOX) injection 30 mg Start: 02/06/24 1000   Antimicrobials: IV meropenem Fluid: LR at 100 mL/h Consultants: None Family Communication: Father at bedside  Status: Inpatient Level of care:  Telemetry Medical   Patient is from: Home Needs to continue in-hospital care: On IV antibiotics, pending culture report Anticipated d/c to: Back to assisted living facility      Diet:  Diet Order     None       Scheduled Meds:  albuterol  2.5 mg Nebulization Q6H   dantrolene  100 mg  Oral TID   divalproex  375 mg Oral Daily   divalproex  500 mg Oral QHS   doxepin  25 mg Per Tube QHS   enoxaparin (LOVENOX) injection  30 mg Subcutaneous Daily   escitalopram  5 mg Per Tube Daily   Molli Posey Peptide  1.5  975 mL Per Tube q1800   lamoTRIgine  50 mg Oral BID   LORazepam  1 mg Oral QHS   OLANZapine  10 mg Oral QHS   OLANZapine  5 mg Oral Daily   oxyBUTYnin  7.5 mg Per Tube QHS   sucralfate  1 g Oral Daily   traZODone  150 mg Oral QHS    PRN meds: acetaminophen **OR** acetaminophen, bisacodyl, diazepam, guaiFENesin-codeine, ondansetron **OR** ondansetron (ZOFRAN) IV   Infusions:   lactated ringers 100 mL/hr at 02/06/24 0624   meropenem (MERREM) IV 200 mL/hr at 02/06/24 0550    Antimicrobials: Anti-infectives (From admission, onward)    Start     Dose/Rate Route Frequency Ordered Stop   02/05/24 2200  meropenem (MERREM) 1 g in sodium chloride 0.9 % 100 mL IVPB        1 g 200 mL/hr over 30 Minutes Intravenous Every 8 hours 02/05/24 2023     02/05/24 1815  levofloxacin (LEVAQUIN) IVPB 750 mg  Status:  Discontinued        750 mg 100 mL/hr over 90 Minutes Intravenous Every 24 hours 02/05/24 1812 02/05/24 1816   02/05/24 1545  cefTRIAXone (ROCEPHIN) 1 g in sodium chloride 0.9 % 100 mL IVPB        1 g 200 mL/hr over 30 Minutes Intravenous  Once 02/05/24 1540 02/05/24 1650   02/05/24 1545  azithromycin (ZITHROMAX) 500 mg in sodium chloride 0.9 % 250 mL IVPB        500 mg 250 mL/hr over 60 Minutes Intravenous  Once 02/05/24 1540 02/05/24 1812       Objective: Vitals:   02/06/24 0749 02/06/24 0810  BP:  116/74  Pulse:  92  Resp:  17  Temp:    SpO2: 94% 91%    Intake/Output Summary (Last 24 hours) at 02/06/2024 1110 Last data filed at 02/06/2024 0553 Gross per 24 hour  Intake 3618.94 ml  Output 2300 ml  Net 1318.94 ml   Filed Weights   02/05/24 1302  Weight: 43.1 kg   Weight change:  Body mass index is 18.56 kg/m.   Physical Exam: General exam: Pleasant, young Caucasian male with cerebral palsy, baseline physical and mental limitations Skin: No rashes, lesions or ulcers. HEENT: Atraumatic, normocephalic, no obvious bleeding Lungs: Clear to auscultation bilaterally,   CVS: S1, S2, no murmur,   GI/Abd: Soft, nontender, nondistended, bowel sound present, PEG tube site intact CNS: Alert, awake, limited capacity to communicate Psychiatry: Mood appropriate,  Extremities: No pedal edema, no calf tenderness,   Data Review: I have personally reviewed the laboratory data and studies available.  F/u labs ordered Unresulted Labs (From admission, onward)     Start     Ordered   02/06/24 1018  Expectorated Sputum Assessment w Gram Stain, Rflx to Resp Cult  Once,   R       Question:  Patient immune status  Answer:  Normal   02/06/24 1017   02/06/24 0755  Procalcitonin  Add-on,   AD       References:    Procalcitonin Lower Respiratory Tract Infection AND Sepsis Procalcitonin Algorithm   02/06/24 0818   02/06/24 0500  Basic metabolic panel  Tomorrow morning,   R        02/05/24 2322   02/06/24 0500  CBC  Tomorrow morning,   R        02/05/24 2322   02/06/24 0500  TSH  Tomorrow morning,   R        02/05/24 2322            Total time spent in review of labs and imaging, patient evaluation, formulation of plan, documentation and communication with family: 55 minutes  Signed, Lorin Glass, MD [name] 02/06/2024

## 2024-02-07 DIAGNOSIS — A419 Sepsis, unspecified organism: Secondary | ICD-10-CM | POA: Diagnosis not present

## 2024-02-07 MED ORDER — SODIUM CHLORIDE 0.9 % IV SOLN
2.0000 g | Freq: Three times a day (TID) | INTRAVENOUS | Status: DC
  Administered 2024-02-07 – 2024-02-08 (×2): 2 g via INTRAVENOUS
  Filled 2024-02-07 (×2): qty 12.5

## 2024-02-07 NOTE — Telephone Encounter (Signed)
Patient currently admitted into the hospital

## 2024-02-07 NOTE — Plan of Care (Signed)

## 2024-02-07 NOTE — Progress Notes (Signed)
 PROGRESS NOTE  [name]  DOB: 09-Jun-1986  PCP: Bradd Canary, MD ZOX:[id number]  DOA: 02/05/2024  LOS: 2 days  Hospital Day: 3  Brief narrative: [name] is a 38 y.o. male with PMH significant for cerebral palsy and spastic quadriplegia, also has hyperthyroidism, hyperlipidemia depression.   The patient was cared for at home for almost 39 years and has been in an assisted living facility for the last 3 months.  3 weeks ago, he had a dental procedure.  Postoperatively, he had fever and persistent cough.  Chest X-ray suspected aspiration pneumonia and he completed 10-day course of Vantin.  Repeat chest x-ray showed improvement.  However, in the 5 days since he has been off antibiotics, he has continued to cough and developed a fever and as he was brought to ED on 4/5.  In the ED, patient had a fever of 101.3, tachycardic to 100s, blood pressure in normal range, breathing on room air. Initial labs with unremarkable CBC, unremarkable BMP, lactic acid normal Respiratory virus panel unremarkable. Urinalysis showed hazy yellow urine with negative leukocytes, positive nitrite, many bacteria Blood culture was sent CT angio of chest did not show any pulm embolism.  It showed  -consolidation throughout the right lower lobe, increased from prior exam from October 2024.  -retained mucus/debris within the trachea and right bronchus intermedius -suspicious of pneumonia (including aspiration) superimposed on chronic round atelectasis. -Patchy ground-glass and tree-in-bud opacity in the right middle lobe, likely infectious/inflammatory.  Patient was started on IV meropenem Admitted to [facility]  Subjective: Patient was seen and examined this morning. Propped up in bed. Dad at bedside. No fever in the last 24 hours. Blood culture is growing staph in 1 bottle, likely contamination  Assessment and plan: Sepsis POA Presented with persistent fever despite completion of a course of  antibiotics Workup suggested right lower lobe pneumonia as well as UTI Blood culture sent Currently on IV meropenem No fever last 24 hours.  WBC count remains normal Continue to monitor Recent Labs  Lab 02/05/24 1449 02/06/24 1330  WBC 7.8 3.6*  LATICACIDVEN 1.0  --   PROCALCITON  --  0.11   Right lower lobe pneumonia Recently completed 10-day course of Vantin despite which continued to have fever Past sputum culture from 2023 grew Pseudomonas and strep agalactiae Pending sputum culture report  UTI Urinalysis with positive nitrite Antibiotics as above It seems urine culture was not sent on admission  Staph hominis in blood culture 1 out of 4 bottle positive for Staph hominis.  Likely contaminant   Chronic atelectasis Noted to have rounded atelectasis in the posterior right lower lobe and chest imaging since 2016.  Cerebral palsy and spastic quadriplegia PTA meds- Depakote, doxepin, Zyprexa, dantrolene, Lexapro, Lamictal, Ativan,Trazodone Continue all meds Supportive care G-J tube for feeding.  Dad reports that the J-tube is clogged.  G-tube in use this morning.  I have requested IR to evaluate it. Continue chest vest while awake    Mobility: Electric power chair bound  Goals of care   Code Status: Limited: Do not attempt resuscitation (DNR) -DNR-LIMITED -Do Not Intubate/DNI      DVT prophylaxis:  enoxaparin (LOVENOX) injection 30 mg Start: 02/06/24 1000   Antimicrobials: IV meropenem Fluid: Can hold off IV fluid today Consultants: None Family Communication: Father at bedside  Status: Inpatient Level of care:  Telemetry Medical   Patient is from: Home Needs to continue in-hospital care: On IV meropenem, pending culture report.  Will discuss with  ID regarding final antibiotic choice Anticipated d/c to: Back to assisted living facility ultimately     Diet:  Diet Order     None       Scheduled Meds:  albuterol  2.5 mg Nebulization Q6H   dantrolene   100 mg Oral TID   divalproex  375 mg Oral Daily   divalproex  500 mg Oral QHS   doxepin  25 mg Per Tube QHS   enoxaparin (LOVENOX) injection  30 mg Subcutaneous Daily   escitalopram  5 mg Per Tube Daily   Molli Posey Peptide 1.5  975 mL Per Tube q1800   lamoTRIgine  50 mg Oral BID   LORazepam  1 mg Oral QHS   OLANZapine  10 mg Oral QHS   OLANZapine  5 mg Oral Daily   oxyBUTYnin  7.5 mg Per Tube QHS   sucralfate  1 g Oral Daily   traZODone  150 mg Oral QHS    PRN meds: acetaminophen **OR** acetaminophen, bisacodyl, diazepam, guaiFENesin-codeine, ondansetron **OR** ondansetron (ZOFRAN) IV   Infusions:   meropenem (MERREM) IV Stopped (02/07/24 0602)    Antimicrobials: Anti-infectives (From admission, onward)    Start     Dose/Rate Route Frequency Ordered Stop   02/05/24 2200  meropenem (MERREM) 1 g in sodium chloride 0.9 % 100 mL IVPB        1 g 200 mL/hr over 30 Minutes Intravenous Every 8 hours 02/05/24 2023     02/05/24 1815  levofloxacin (LEVAQUIN) IVPB 750 mg  Status:  Discontinued        750 mg 100 mL/hr over 90 Minutes Intravenous Every 24 hours 02/05/24 1812 02/05/24 1816   02/05/24 1545  cefTRIAXone (ROCEPHIN) 1 g in sodium chloride 0.9 % 100 mL IVPB        1 g 200 mL/hr over 30 Minutes Intravenous  Once 02/05/24 1540 02/05/24 1650   02/05/24 1545  azithromycin (ZITHROMAX) 500 mg in sodium chloride 0.9 % 250 mL IVPB        500 mg 250 mL/hr over 60 Minutes Intravenous  Once 02/05/24 1540 02/05/24 1812       Objective: Vitals:   02/07/24 0731 02/07/24 0833  BP: 111/75   Pulse: 75   Resp: 17   Temp:    SpO2: 98% 95%    Intake/Output Summary (Last 24 hours) at 02/07/2024 1133 Last data filed at 02/07/2024 0659 Gross per 24 hour  Intake 313.09 ml  Output 2850 ml  Net -2536.91 ml   Filed Weights   02/05/24 1302  Weight: 43.1 kg   Weight change:  Body mass index is 18.56 kg/m.   Physical Exam: General exam: Pleasant, young Caucasian male with cerebral  palsy, baseline physical and mental limitations Skin: No rashes, lesions or ulcers. HEENT: Atraumatic, normocephalic, no obvious bleeding Lungs: Clear to auscultation bilaterally,  CVS: S1, S2, no murmur,   GI/Abd: Soft, nontender, nondistended, bowel sound present, GJ tube site intact CNS: Alert, awake, limited capacity to communicate Psychiatry: Mood appropriate,  Extremities: No pedal edema, no calf tenderness,   Data Review: I have personally reviewed the laboratory data and studies available.  F/u labs ordered Unresulted Labs (From admission, onward)     Start     Ordered   02/06/24 1018  Expectorated Sputum Assessment w Gram Stain, Rflx to Resp Cult  Once,   R       Question:  Patient immune status  Answer:  Normal   02/06/24 1017  Total time spent in review of labs and imaging, patient evaluation, formulation of plan, documentation and communication with family: 45 minutes  Signed, Lorin Glass, MD [name] 02/07/2024

## 2024-02-08 DIAGNOSIS — A419 Sepsis, unspecified organism: Secondary | ICD-10-CM | POA: Diagnosis not present

## 2024-02-08 LAB — CULTURE, BLOOD (ROUTINE X 2): Special Requests: ADEQUATE

## 2024-02-08 MED ORDER — LEVOFLOXACIN 25 MG/ML PO SOLN
750.0000 mg | Freq: Every day | ORAL | Status: DC
  Administered 2024-02-08 – 2024-02-09 (×2): 750 mg via ORAL
  Filled 2024-02-08 (×2): qty 30

## 2024-02-08 NOTE — Plan of Care (Signed)
  Problem: Health Behavior/Discharge Planning: Goal: Ability to manage health-related needs will improve Outcome: Progressing   Problem: Education: Goal: Knowledge of General Education information will improve Description: Including pain rating scale, medication(s)/side effects and non-pharmacologic comfort measures Outcome: Progressing   Problem: Elimination: Goal: Will not experience complications related to bowel motility Outcome: Progressing   Problem: Elimination: Goal: Will not experience complications related to urinary retention Outcome: Progressing

## 2024-02-08 NOTE — Progress Notes (Signed)
 PROGRESS NOTE  SALIOU [name]  DOB: Mar 26, 1986  PCP: Bradd Canary, MD WUJ:[id number]  DOA: 02/05/2024  LOS: 3 days  Hospital Day: 4  Brief narrative: [name] is a 38 y.o. male with PMH significant for cerebral palsy and spastic quadriplegia, also has hyperthyroidism, hyperlipidemia depression.   The patient was cared for at home for almost 39 years and has been in an assisted living facility for the last 3 months.  3 weeks ago, he had a dental procedure.  Postoperatively, he had fever and persistent cough.  Chest X-ray suspected aspiration pneumonia and he completed 10-day course of Vantin.  Repeat chest x-ray showed improvement.  However, in the 5 days since he has been off antibiotics, he has continued to cough and developed a fever and as he was brought to ED on 4/5.  In the ED, patient had a fever of 101.3, tachycardic to 100s, blood pressure in normal range, breathing on room air. Initial labs with unremarkable CBC, unremarkable BMP, lactic acid normal Respiratory virus panel unremarkable. Urinalysis showed hazy yellow urine with negative leukocytes, positive nitrite, many bacteria Blood culture was sent CT angio of chest did not show any pulm embolism.  It showed  -consolidation throughout the right lower lobe, increased from prior exam from October 2024.  -retained mucus/debris within the trachea and right bronchus intermedius -suspicious of pneumonia (including aspiration) superimposed on chronic round atelectasis. -Patchy ground-glass and tree-in-bud opacity in the right middle lobe, likely infectious/inflammatory.  Patient was started on IV meropenem Admitted to [facility]  Subjective: Patient was seen and examined this morning. Propped up in bed.  Dad at bedside. No fever in the last 24 hours. Since antibiotic was changed from meropenem to cefepime yesterday, patient has had 3 watery bowel movements.  Dad is concerned about side effect.  Decided to switch to Levaquin  today.  Assessment and plan: Sepsis POA Diarrhea Presented with persistent fever despite completion of a course of antibiotics Workup suggested right lower lobe pneumonia as well as UTI Blood culture sent Was initially started on IV meropenem.  No true growth on culture.  Antibiotic was switched to cefepime yesterday.  Since then, patient had 3 watery bowel movements.  Dad is concerned about side effect.  He states patient has tolerated ciprofloxacin in the past.  We have decided to switch him to Levaquin. No fever last 24 hours.  WBC count remains normal Continue to monitor Recent Labs  Lab 02/05/24 1449 02/06/24 1330  WBC 7.8 3.6*  LATICACIDVEN 1.0  --   PROCALCITON  --  0.11   Right lower lobe pneumonia Recently completed 10-day course of Vantin despite which continued to have fever Past sputum culture from 2023 grew Pseudomonas and strep agalactiae Pending sputum culture report  UTI Urinalysis with positive nitrite Antibiotics as above It seems urine culture was not sent on admission  Staph hominis in blood culture 1 out of 4 bottle positive for Staph hominis.  Likely contaminant   Chronic atelectasis Noted to have rounded atelectasis in the posterior right lower lobe and chest imaging since 2016.  Cerebral palsy and spastic quadriplegia PTA meds- Depakote, doxepin, Zyprexa, dantrolene, Lexapro, Lamictal, Ativan,Trazodone Continue all meds Supportive care Has a GJ tube in place.  Family uses J-tube feeding.   Continue chest vest while awake    Mobility: Electric power chair bound  Goals of care   Code Status: Limited: Do not attempt resuscitation (DNR) -DNR-LIMITED -Do Not Intubate/DNI      DVT  prophylaxis:  enoxaparin (LOVENOX) injection 30 mg Start: 02/06/24 1000   Antimicrobials: Levaquin Fluid: Can hold off IV fluid today Consultants: None Family Communication: Father at bedside  Status: Inpatient Level of care:  Telemetry Medical   Patient is  from: Home Needs to continue in-hospital care: Monitor diarrhea. Anticipated d/c to: Back to assisted living facility tomorrow if diarrhea improves.  Repeat labs in a.m.     Diet:  Diet Order     None       Scheduled Meds:  albuterol  2.5 mg Nebulization Q6H   dantrolene  100 mg Oral TID   divalproex  375 mg Oral Daily   divalproex  500 mg Oral QHS   doxepin  25 mg Per Tube QHS   enoxaparin (LOVENOX) injection  30 mg Subcutaneous Daily   escitalopram  5 mg Per Tube Daily   Molli Posey Peptide 1.5  975 mL Per Tube q1800   lamoTRIgine  50 mg Oral BID   levofloxacin  750 mg Oral Daily   LORazepam  1 mg Oral QHS   OLANZapine  10 mg Oral QHS   OLANZapine  5 mg Oral Daily   oxyBUTYnin  7.5 mg Per Tube QHS   sucralfate  1 g Oral Daily   traZODone  150 mg Oral QHS    PRN meds: acetaminophen **OR** acetaminophen, diazepam, guaiFENesin-codeine, ondansetron **OR** ondansetron (ZOFRAN) IV   Infusions:     Antimicrobials: Anti-infectives (From admission, onward)    Start     Dose/Rate Route Frequency Ordered Stop   02/08/24 1415  levofloxacin (LEVAQUIN) 25 MG/ML solution 750 mg        750 mg Oral Daily 02/08/24 1330 02/11/24 0959   02/07/24 1530  ceFEPIme (MAXIPIME) 2 g in sodium chloride 0.9 % 100 mL IVPB  Status:  Discontinued        2 g 200 mL/hr over 30 Minutes Intravenous Every 8 hours 02/07/24 1442 02/08/24 1330   02/05/24 2200  meropenem (MERREM) 1 g in sodium chloride 0.9 % 100 mL IVPB  Status:  Discontinued        1 g 200 mL/hr over 30 Minutes Intravenous Every 8 hours 02/05/24 2023 02/07/24 1442   02/05/24 1815  levofloxacin (LEVAQUIN) IVPB 750 mg  Status:  Discontinued        750 mg 100 mL/hr over 90 Minutes Intravenous Every 24 hours 02/05/24 1812 02/05/24 1816   02/05/24 1545  cefTRIAXone (ROCEPHIN) 1 g in sodium chloride 0.9 % 100 mL IVPB        1 g 200 mL/hr over 30 Minutes Intravenous  Once 02/05/24 1540 02/05/24 1650   02/05/24 1545  azithromycin  (ZITHROMAX) 500 mg in sodium chloride 0.9 % 250 mL IVPB        500 mg 250 mL/hr over 60 Minutes Intravenous  Once 02/05/24 1540 02/05/24 1812       Objective: Vitals:   02/08/24 0921 02/08/24 1209  BP:  111/70  Pulse:  79  Resp:  20  Temp:  99.1 F (37.3 C)  SpO2: 99% 96%    Intake/Output Summary (Last 24 hours) at 02/08/2024 1501 Last data filed at 02/08/2024 0920 Gross per 24 hour  Intake 300 ml  Output 1825 ml  Net -1525 ml   Filed Weights   02/05/24 1302  Weight: 43.1 kg   Weight change:  Body mass index is 18.56 kg/m.   Physical Exam: General exam: Pleasant, young Caucasian male with cerebral palsy, baseline physical and  mental limitations Skin: No rashes, lesions or ulcers. HEENT: Atraumatic, normocephalic, no obvious bleeding Lungs: Clear to auscultation bilaterally,  CVS: S1, S2, no murmur,   GI/Abd: Soft, nontender, nondistended, bowel sound present, GJ tube site intact CNS: Alert, awake, limited capacity to communicate Psychiatry: Mood appropriate,  Extremities: No pedal edema, no calf tenderness,   Data Review: I have personally reviewed the laboratory data and studies available.  F/u labs ordered Unresulted Labs (From admission, onward)     Start     Ordered   02/09/24 0500  Basic metabolic panel with GFR  Tomorrow morning,   R        02/08/24 1501   02/09/24 0500  CBC with Differential/Platelet  Tomorrow morning,   R        02/08/24 1501   02/06/24 1018  Expectorated Sputum Assessment w Gram Stain, Rflx to Resp Cult  Once,   R       Question:  Patient immune status  Answer:  Normal   02/06/24 1017            Total time spent in review of labs and imaging, patient evaluation, formulation of plan, documentation and communication with family: 45 minutes  Signed, Lorin Glass, MD [name] 02/08/2024

## 2024-02-08 NOTE — Progress Notes (Signed)
 Pt Father notified nursing staff that the pt has had 2 episode of excessive diarrhea after antibiotic being changed. Notified Dahal, MD of pt family concerns with abx being changed and possible cause of diarrhea. MD indicated to hold ABX.

## 2024-02-08 NOTE — TOC Initial Note (Signed)
 Transition of Care [facility]) - Initial/Assessment Note    Patient Details  Name: [name] MRN: [id number] Date of Birth: 12-05-1985  Transition of [facility]) CM/SW Contact:    Chad Weide A [place], LCSW Phone Number: 02/08/2024, 2:19 PM  Clinical Narrative:                  CSW met with pt and pt's mother, [name] at bedside.  She stated that pt has been living at a new supportive community care service home, [facility],  for the last month. She said that she was planning to take pt home then back to his home where he has caregivers assisting, along with NP.   CSW to reach out to facility and update them of pt's pending DC and plan for family to take pt home before returning to his assisted living home.   CSW contacted Chad Folks, NP at the [facility], to let her know of possible plan, left VM with contact information to CSW.    TOC will continue to follow.  Expected Discharge Plan: Home/Self Care Barriers to Discharge: Continued Medical Work up   Patient Goals and CMS Choice            Expected Discharge Plan and Services       Living arrangements for the past 2 months: Assisted Living Facility                                      Prior Living Arrangements/Services Living arrangements for the past 2 months: Assisted Living Facility Lives with:: Self          Need for Family Participation in Patient Care: Yes (Comment) Care giver support system in place?: Yes (comment) (pt's parents) Current home services: Homehealth aide, Home RN    Activities of Daily Living   ADL Screening (condition at time of admission) Independently performs ADLs?: No Does the patient have a NEW difficulty with bathing/dressing/toileting/self-feeding that is expected to last >3 days?: No Does the patient have a NEW difficulty with getting in/out of bed, walking, or climbing stairs that is expected to last >3 days?: No Does the patient have a NEW difficulty with communication that is expected  to last >3 days?: No Is the patient deaf or have difficulty hearing?: Yes Does the patient have difficulty seeing, even when wearing glasses/contacts?: No Does the patient have difficulty concentrating, remembering, or making decisions?: Yes  Permission Sought/Granted                  Emotional Assessment   Attitude/Demeanor/Rapport:  (normal) Affect (typically observed): Appropriate Orientation: : Oriented to Self Alcohol / Substance Use: Not Applicable Psych Involvement: No (comment)  Admission diagnosis:  Sepsis (HCC) [A41.9] Pneumonia due to infectious organism, unspecified laterality, unspecified part of lung [J18.9] Sepsis, due to unspecified organism, unspecified whether acute organ dysfunction present [facility]) [A41.9] Patient Active Problem List   Diagnosis Date Noted   Sepsis (HCC) 02/05/2024   Abscess of axilla, left 01/18/2023   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   Agitation 08/18/2022   Insomnia 06/19/2022   Bilateral knee pain 06/11/2022   Therapeutic drug monitoring 06/11/2022   PVD (peripheral vascular disease) (HCC) 08/10/2021   Close exposure to COVID-19 virus 10/16/2019   Mildly underweight adult 03/16/2017   Skin lesion of right ear 03/16/2017   Increased oropharyngeal secretions 04/14/2016   Medicare annual wellness visit, subsequent 08/25/2015  Hyperlipidemia, mild 08/25/2015   Recurrent aspiration pneumonia (HCC) 12/17/2014   Esophageal dysphagia 09/06/2014   Aspiration pneumonia (HCC) 09/05/2014   Dysphagia, pharyngoesophageal phase 09/02/2014   Impetigo 09/02/2014   Abnormal thyroid function test 06/10/2014   Protein-calorie malnutrition, severe (HCC) 11/23/2013   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   PCP:  Bradd Canary, MD Pharmacy:   [facility] - [place], [place] - [address] [place] [address] Phone: [phone] Fax: [phone]     Social Drivers of Health (SDOH) Social History: SDOH Screenings   Food Insecurity: No Food Insecurity (02/05/2024)  Housing: Low Risk  (02/05/2024)  Transportation Needs: No Transportation Needs (02/05/2024)  Utilities: Not At Risk (02/05/2024)  Alcohol Screen: Low Risk  (10/05/2023)  Depression (PHQ2-9): Low Risk  (12/29/2023)  Financial Resource Strain: Low Risk  (01/20/2024)  Physical Activity: Inactive (01/20/2024)  Social Connections: Moderately Integrated (01/20/2024)  Stress: Stress Concern Present (01/20/2024)  Tobacco Use: Low Risk  (02/05/2024)  Health Literacy: Adequate Health Literacy (10/05/2023)   SDOH Interventions:     Readmission Risk Interventions    02/08/2024    2:17 PM  Readmission Risk Prevention Plan  Transportation Screening Complete  PCP or Specialist Appt within 5-7 Days Complete  Home Care Screening Complete  Medication Review (RN CM) Complete

## 2024-02-08 NOTE — Care Management Important Message (Signed)
 Important Message  Patient Details  Name: [name] MRN: [id number] Date of Birth: December 07, 1985   Important Message Given:  Yes - Medicare IM     [name] 02/08/2024, 2:26 PM

## 2024-02-08 NOTE — Plan of Care (Signed)

## 2024-02-08 NOTE — Telephone Encounter (Signed)
 Called and spoke with patient's mother. She said patient is still in the hospital. Father stayed overnight with him and they are waiting on doctor to discharge the patient. She said his cough still sounds bad and he is experiencing diarrhea from the IV antibiotics. Once the patient is discharged, mom plans on taking him home with her for 1 night before he goes back to the facility.

## 2024-02-09 ENCOUNTER — Encounter: Payer: Self-pay | Admitting: Family Medicine

## 2024-02-09 DIAGNOSIS — A419 Sepsis, unspecified organism: Secondary | ICD-10-CM | POA: Diagnosis not present

## 2024-02-09 LAB — BASIC METABOLIC PANEL WITH GFR
Anion gap: 9 (ref 5–15)
BUN: 10 mg/dL (ref 6–20)
CO2: 24 mmol/L (ref 22–32)
Calcium: 8.7 mg/dL — ABNORMAL LOW (ref 8.9–10.3)
Chloride: 101 mmol/L (ref 98–111)
Creatinine, Ser: <0.3 mg/dL — ABNORMAL LOW (ref 0.61–1.24)
Glucose, Bld: 99 mg/dL (ref 70–99)
Potassium: 4.5 mmol/L (ref 3.5–5.1)
Sodium: 134 mmol/L — ABNORMAL LOW (ref 135–145)

## 2024-02-09 LAB — CBC WITH DIFFERENTIAL/PLATELET
Abs Immature Granulocytes: 0.02 10*3/uL (ref 0.00–0.07)
Basophils Absolute: 0 10*3/uL (ref 0.0–0.1)
Basophils Relative: 1 %
Eosinophils Absolute: 0.2 10*3/uL (ref 0.0–0.5)
Eosinophils Relative: 6 %
HCT: 38.6 % — ABNORMAL LOW (ref 39.0–52.0)
Hemoglobin: 12.9 g/dL — ABNORMAL LOW (ref 13.0–17.0)
Immature Granulocytes: 1 %
Lymphocytes Relative: 35 %
Lymphs Abs: 1.4 10*3/uL (ref 0.7–4.0)
MCH: 31.7 pg (ref 26.0–34.0)
MCHC: 33.4 g/dL (ref 30.0–36.0)
MCV: 94.8 fL (ref 80.0–100.0)
Monocytes Absolute: 0.3 10*3/uL (ref 0.1–1.0)
Monocytes Relative: 8 %
Neutro Abs: 2 10*3/uL (ref 1.7–7.7)
Neutrophils Relative %: 49 %
Platelets: 226 10*3/uL (ref 150–400)
RBC: 4.07 MIL/uL — ABNORMAL LOW (ref 4.22–5.81)
RDW: 12 % (ref 11.5–15.5)
WBC: 3.9 10*3/uL — ABNORMAL LOW (ref 4.0–10.5)
nRBC: 0 % (ref 0.0–0.2)

## 2024-02-09 MED ORDER — LEVOFLOXACIN 25 MG/ML PO SOLN
750.0000 mg | Freq: Every day | ORAL | 0 refills | Status: AC
Start: 2024-02-09 — End: 2024-02-14

## 2024-02-09 NOTE — Discharge Summary (Signed)
 Physician Discharge Summary  [name] UVO:[id number] DOB: 1986-09-28 DOA: 02/05/2024  PCP: Bradd Canary, MD  Admit date: 02/05/2024 Discharge date: 02/09/2024  Admitted From: Assisted living facility Discharge disposition: Home.  Per dad, they are taking him home for few days before returning to ALF  Recommendations at discharge:  Complete the course of antibiotics with Levaquin for next 5 days along with probiotics Continue prior meds  Brief narrative: [name] is a 38 y.o. male with PMH significant for cerebral palsy and spastic quadriplegia, also has hyperthyroidism, hyperlipidemia depression.   The patient was cared for at home for almost 39 years and has been in an assisted living facility for the last 3 months.  3 weeks ago, he had a dental procedure.  Postoperatively, he had fever and persistent cough.  Chest X-ray suspected aspiration pneumonia and he completed 10-day course of Vantin.  Repeat chest x-ray showed improvement.  However, in the 5 days since he has been off antibiotics, he has continued to cough and developed a fever and as he was brought to ED on 4/5.  In the ED, patient had a fever of 101.3, tachycardic to 100s, blood pressure in normal range, breathing on room air. Initial labs with unremarkable CBC, unremarkable BMP, lactic acid normal Respiratory virus panel unremarkable. Urinalysis showed hazy yellow urine with negative leukocytes, positive nitrite, many bacteria Blood culture was sent CT angio of chest did not show any pulm embolism.  It showed  -consolidation throughout the right lower lobe, increased from prior exam from October 2024.  -retained mucus/debris within the trachea and right bronchus intermedius -suspicious of pneumonia (including aspiration) superimposed on chronic round atelectasis. -Patchy ground-glass and tree-in-bud opacity in the right middle lobe, likely infectious/inflammatory.  Patient was started on IV meropenem Admitted  to [facility]  Subjective: Patient was seen and examined this morning. Propped up in bed.  Dad at bedside. No fever in the last 24 hours. Diarrhea has improved.  Only 1 bowel movement in the last 24 hours.  Patient has tolerated levofloxacin without side effect  Hospital course: Sepsis POA Diarrhea Presented with persistent fever despite completion of a course of antibiotics Workup suggested right lower lobe pneumonia as well as UTI Blood culture sent Was initially started on IV meropenem.  No true growth on culture.  Antibiotic was switched to cefepime.  Since then, patient had 3 watery bowel movements.  Dad was concerned about side effect.  He stated patient has tolerated ciprofloxacin in the past.  We decided to switch him to Levaquin.  Since then, diarrhea has improved.  Patient has tolerated levofloxacin without side effect.  Will continue levofloxacin for next 5 days at discharge, with probiotics. Stable for discharge to home today Recent Labs  Lab 02/05/24 1449 02/06/24 1330 02/09/24 0614  WBC 7.8 3.6* 3.9*  LATICACIDVEN 1.0  --   --   PROCALCITON  --  0.11  --    Right lower lobe pneumonia Recently completed 10-day course of Vantin despite which continued to have fever Past sputum culture from 2023 grew Pseudomonas and strep agalactiae Sputum culture did not show any growth.  UTI Urinalysis with positive nitrite Antibiotics as above It seems urine culture was not sent on admission  Staph hominis in blood culture 1 out of 4 bottle positive for Staph hominis.  Likely contaminant   Chronic atelectasis Noted to have rounded atelectasis in the posterior right lower lobe and chest imaging since 2016.  Cerebral palsy and spastic quadriplegia  PTA meds- Depakote, doxepin, Zyprexa, dantrolene, Lexapro, Lamictal, Ativan,Trazodone Continue all meds Supportive care Has a GJ tube in place.  Family uses J-tube feeding.   Continue chest vest while awake    Mobility: Electric power  chair bound  Goals of care   Code Status: Limited: Do not attempt resuscitation (DNR) -DNR-LIMITED -Do Not Intubate/DNI    Diet:  Diet Order             Diet general                   Nutritional status:  Body mass index is 18.56 kg/m.       Wounds:  -    Discharge Exam:   Vitals:   02/08/24 2340 02/09/24 0541 02/09/24 0756 02/09/24 0828  BP: (!) 95/57 115/80 119/88   Pulse: 79 80 93 90  Resp: 18 18 20 18   Temp: 99 F (37.2 C) 97.6 F (36.4 C) 97.8 F (36.6 C)   TempSrc: Oral Oral Oral   SpO2: 97% 98% 96%   Weight:        Body mass index is 18.56 kg/m.  General exam: Pleasant, young Caucasian male with cerebral palsy, baseline physical and mental limitations Skin: No rashes, lesions or ulcers. HEENT: Atraumatic, normocephalic, no obvious bleeding Lungs: Clear to auscultation bilaterally,  CVS: S1, S2, no murmur,   GI/Abd: Soft, nontender, nondistended, bowel sound present, GJ tube site intact CNS: Alert, awake, limited capacity to communicate Psychiatry: Mood appropriate,  Extremities: No pedal edema, no calf tenderness,   Follow ups:    Follow-up Information     Bradd Canary, MD Follow up.   Specialty: Family Medicine Contact information: [address] [place] [address] [phone]                 Discharge Instructions:   Discharge Instructions     Call MD for:  difficulty breathing, headache or visual disturbances   Complete by: As directed    Call MD for:  extreme fatigue   Complete by: As directed    Call MD for:  hives   Complete by: As directed    Call MD for:  persistant dizziness or light-headedness   Complete by: As directed    Call MD for:  persistant nausea and vomiting   Complete by: As directed    Call MD for:  severe uncontrolled pain   Complete by: As directed    Call MD for:  temperature >100.4   Complete by: As directed    Diet general   Complete by: As directed    Continue tube  feeding as before.   Discharge instructions   Complete by: As directed    Recommendations at discharge:   Complete the course of antibiotics with Levaquin for next 5 days along with probiotics  Continue prior meds  General discharge instructions: Follow with Primary MD Bradd Canary, MD in 7 days  Please request your PCP  to go over your hospital tests, procedures, radiology results at the follow up. Please get your medicines reviewed and adjusted.  Your PCP may decide to repeat certain labs or tests as needed. Do not drive, operate heavy machinery, perform activities at heights, swimming or participation in water activities or provide baby sitting services if your were admitted for syncope or siezures until you have seen by Primary MD or a Neurologist and advised to do so again. [place] Controlled Substance Reporting System database was  reviewed. Do not drive, operate heavy machinery, perform activities at heights, swim, participate in water activities or provide baby-sitting services while on medications for pain, sleep and mood until your outpatient physician has reevaluated you and advised to do so again.  You are strongly recommended to comply with the dose, frequency and duration of prescribed medications. Activity: As tolerated with Full fall precautions use walker/cane & assistance as needed Avoid using any recreational substances like cigarette, tobacco, alcohol, or non-prescribed drug. If you experience worsening of your admission symptoms, develop shortness of breath, life threatening emergency, suicidal or homicidal thoughts you must seek medical attention immediately by calling 911 or calling your MD immediately  if symptoms less severe. You must read complete instructions/literature along with all the possible adverse reactions/side effects for all the medicines you take and that have been prescribed to you. Take any new medicine only after you have completely understood and  accepted all the possible adverse reactions/side effects.  Wear Seat belts while driving. You were cared for by a hospitalist during your hospital stay. If you have any questions about your discharge medications or the care you received while you were in the hospital after you are discharged, you can call the unit and ask to speak with the hospitalist or the covering physician. Once you are discharged, your primary care physician will handle any further medical issues. Please note that NO REFILLS for any discharge medications will be authorized once you are discharged, as it is imperative that you return to your primary care physician (or establish a relationship with a primary care physician if you do not have one).   Increase activity slowly   Complete by: As directed        Discharge Medications:   Allergies as of 02/09/2024       Reactions   Ambien [zolpidem Tartrate] Nausea Only   Antihistamines, Chlorpheniramine-type    Other reaction(s): Other (See Comments) Other Reaction: agitation   Augmentin [amoxicillin-pot Clavulanate] Diarrhea   Cefdinir Diarrhea   Not tolerate well and cause diarrhea   Codeine Other (See Comments)   Makes patient too active after a few days.   Zolpidem    Other Reaction(s): GI Intolerance   Baclofen Anxiety, Swelling   anxiety   Metoclopramide Other (See Comments), Swelling   Delusion, emotionality Other Reaction(s): Mental Status Changes   Pheniramine Rash   Other reaction(s): Other (See Comments) Other Reaction: agitation Other Reaction(s): Other (See Comments) Other reaction(s): Other (See Comments), Other Reaction: agitation   Sulfa Antibiotics Rash   Sulfonamide Derivatives Rash        Medication List     STOP taking these medications    cefpodoxime 100 MG/5ML suspension Commonly known as: VANTIN       TAKE these medications    albuterol (2.5 MG/3ML) 0.083% nebulizer solution Commonly known as: PROVENTIL Take 3 mLs (2.5 mg  total) by nebulization every 4 (four) hours as needed for wheezing or shortness of breath.   dantrolene 100 MG capsule Commonly known as: DANTRIUM Take 1 capsule (100 mg total) by mouth 3 (three) times daily. What changed: how to take this   Depakote Sprinkles 125 MG capsule Generic drug: divalproex Take 250-375 mg by mouth See admin instructions. Take 375mg  (3 capsules) by mouth at 0900, 250mg  (2 capsules) at 2030, and 250mg  (2 capsules) at 2130.   Dextromethorphan-guaiFENesin 5-100 MG/5ML Liqd Give 20 mLs by tube every 6 (six) hours as needed (Cough).   diazepam 5 MG tablet Commonly  known as: VALIUM Place 5 mg into feeding tube daily as needed for anxiety.   doxepin 25 MG capsule Commonly known as: SINEQUAN Place 25 mg into feeding tube at bedtime.   escitalopram 5 MG/5ML solution Commonly known as: LEXAPRO Place 20 mLs into feeding tube daily.   feeding supplement (KATE FARMS STANDARD 1.4) Liqd liquid Take 325 mLs by mouth as directed. 3 carton over 16 hrs   FLORASTOR PO Give 1 capsule by tube every evening.   Hydromet 5-1.5 MG/5ML syrup Generic drug: HYDROcodone bit-homatropine Place 5 mLs into feeding tube every 6 (six) hours as needed for cough.   lamoTRIgine 25 MG tablet Commonly known as: LAMICTAL TAKE 2 TABLETS BY MOUTH 2 TIMES DAILY. What changed: how to take this   levofloxacin 25 MG/ML solution Commonly known as: LEVAQUIN Take 30 mLs (750 mg total) by mouth daily for 5 days.   LORazepam 1 MG tablet Commonly known as: ATIVAN Place 1 mg into feeding tube at bedtime.   OLANZapine 5 MG tablet Commonly known as: ZYPREXA Place 5 mg into feeding tube daily. @1700    OLANZapine 10 MG tablet Commonly known as: ZYPREXA Place 10 mg into feeding tube at bedtime.   omeprazole 20 MG capsule Commonly known as: PRILOSEC Take 2 capsules (40 mg total) by mouth daily.   OVER THE COUNTER MEDICATION Give 1 capsule by tube 2 (two) times daily. Muscle Calm Formula    OVER THE COUNTER MEDICATION Give 1 capsule by tube at bedtime. CBD/Melaonin   oxyBUTYnin 5 MG/5ML solution Commonly known as: DITROPAN Place 7.5 mg into feeding tube at bedtime.   sucralfate 1 g tablet Commonly known as: CARAFATE Take 1 tablet (1 g total) by mouth daily. TAKE 1 TABLET BY MOUTH 4 TIMES DAILY WITH MEALS AND AT BEDTIME What changed:  how to take this additional instructions   traZODone 150 MG tablet Commonly known as: DESYREL Place 150 mg into feeding tube at bedtime.         The results of significant diagnostics from this hospitalization (including imaging, microbiology, ancillary and laboratory) are listed below for reference.    Procedures and Diagnostic Studies:   CT Angio Chest PE W and/or Wo Contrast Result Date: 02/05/2024 CLINICAL DATA:  Pulmonary embolism (PE) suspected, high prob Cough. EXAM: CT ANGIOGRAPHY CHEST WITH CONTRAST TECHNIQUE: Multidetector CT imaging of the chest was performed using the standard protocol during bolus administration of intravenous contrast. Multiplanar CT image reconstructions and MIPs were obtained to evaluate the vascular anatomy. RADIATION DOSE REDUCTION: This exam was performed according to the departmental dose-optimization program which includes automated exposure control, adjustment of the mA and/or kV according to patient size and/or use of iterative reconstruction technique. CONTRAST:  75mL OMNIPAQUE IOHEXOL 350 MG/ML SOLN COMPARISON:  Radiograph earlier today.  Chest CT 08/06/2023 FINDINGS: Cardiovascular: There are no filling defects within the pulmonary arteries to suggest pulmonary embolus. Thoracic aorta is normal in caliber without acute aortic findings. The heart is normal in size. No significant pericardial effusion. Mediastinum/Nodes: No mediastinal or hilar adenopathy. Decompressed esophagus. No thyroid nodule. Lungs/Pleura: Dependent mucus/debris within the trachea. There is subtotal filling of the right bronchus  intermedius. Consolidation throughout the right lower lobe, increased from prior exam. This likely represents acute airspace disease superimposed on chronic round atelectasis. Additionally there is patchy ground-glass and tree-in-bud opacity in the right middle lobe. Small right pleural effusion. The left lung is clear. Upper Abdomen: No acute findings. Musculoskeletal: Scoliosis post spinal fixation. Pectus excavatum deformity of  the thorax. No acute osseous findings. Small catheter canal with tip at the upper thoracic spine. No acute osseous findings. Review of the MIP images confirms the above findings. IMPRESSION: 1. No pulmonary embolus. 2. Consolidation throughout the right lower lobe, increased from prior exam. There is also retained mucus/debris within the trachea and right bronchus intermedius. Favor acute airspace disease/pneumonia (including aspiration) superimposed on chronic round atelectasis. 3. Patchy ground-glass and tree-in-bud opacity in the right middle lobe, likely infectious/inflammatory. 4. Small right pleural effusion. Electronically Signed   By: Narda Rutherford M.D.   On: 02/05/2024 17:33   DG Chest Portable 1 View Result Date: 02/05/2024 CLINICAL DATA:  Cough. EXAM: PORTABLE CHEST 1 VIEW COMPARISON:  Chest radiograph dated 01/20/2024. FINDINGS: Stable cardiomediastinal silhouette. Similar retained catheter or lead projecting over the right chest wall. Unchanged right lower lobe density, present since 2016, compatible with rounded atelectasis. No new focal consolidation, sizeable pleural effusion, or pneumothorax. Prior thoracolumbar fusion. No acute osseous abnormality. IMPRESSION: No acute cardiopulmonary findings. Electronically Signed   By: Hart Robinsons M.D.   On: 02/05/2024 14:49     Labs:   Basic Metabolic Panel: Recent Labs  Lab 02/05/24 1449 02/06/24 1330 02/09/24 0614  NA 135 136 134*  K 3.7 3.9 4.5  CL 98 102 101  CO2 25 22 24   GLUCOSE 86 115* 99  BUN 7 6 10    CREATININE <0.30* <0.30* <0.30*  CALCIUM 9.0 8.5* 8.7*   GFR CrCl cannot be calculated (This lab value cannot be used to calculate CrCl because it is not a number: <0.30). Liver Function Tests: No results for input(s): "AST", "ALT", "ALKPHOS", "BILITOT", "PROT", "ALBUMIN" in the last 168 hours. No results for input(s): "LIPASE", "AMYLASE" in the last 168 hours. No results for input(s): "AMMONIA" in the last 168 hours. Coagulation profile No results for input(s): "INR", "PROTIME" in the last 168 hours.  CBC: Recent Labs  Lab 02/05/24 1449 02/06/24 1330 02/09/24 0614  WBC 7.8 3.6* 3.9*  NEUTROABS 5.9  --  2.0  HGB 14.3 12.2* 12.9*  HCT 41.8 36.3* 38.6*  MCV 93.3 95.0 94.8  PLT 228 175 226   Cardiac Enzymes: No results for input(s): "CKTOTAL", "CKMB", "CKMBINDEX", "TROPONINI" in the last 168 hours. BNP: Invalid input(s): "POCBNP" CBG: No results for input(s): "GLUCAP" in the last 168 hours. D-Dimer No results for input(s): "DDIMER" in the last 72 hours. Hgb A1c No results for input(s): "HGBA1C" in the last 72 hours. Lipid Profile No results for input(s): "CHOL", "HDL", "LDLCALC", "TRIG", "CHOLHDL", "LDLDIRECT" in the last 72 hours. Thyroid function studies Recent Labs    02/06/24 1330  TSH 1.395   Anemia work up No results for input(s): "VITAMINB12", "FOLATE", "FERRITIN", "TIBC", "IRON", "RETICCTPCT" in the last 72 hours. Microbiology Recent Results (from the past 240 hours)  Resp panel by RT-PCR (RSV, Flu A&B, Covid) Anterior Nasal Swab     Status: None   Collection Time: 02/05/24  1:06 PM   Specimen: Anterior Nasal Swab  Result Value Ref Range Status   SARS Coronavirus 2 by RT PCR NEGATIVE NEGATIVE Final    Comment: (NOTE) SARS-CoV-2 target nucleic acids are NOT DETECTED.  The SARS-CoV-2 RNA is generally detectable in upper respiratory specimens during the acute phase of infection. The lowest concentration of SARS-CoV-2 viral copies this assay can detect  is 138 copies/mL. A negative result does not preclude SARS-Cov-2 infection and should not be used as the sole basis for treatment or other patient management decisions. A negative result  may occur with  improper specimen collection/handling, submission of specimen other than nasopharyngeal swab, presence of viral mutation(s) within the areas targeted by this assay, and inadequate number of viral copies(<138 copies/mL). A negative result must be combined with clinical observations, patient history, and epidemiological information. The expected result is Negative.  Fact Sheet for Patients:  BloggerCourse.com  Fact Sheet for Healthcare Providers:  SeriousBroker.it  This test is no t yet approved or cleared by the [place] FDA and  has been authorized for detection and/or diagnosis of SARS-CoV-2 by FDA under an Emergency Use Authorization (EUA). This EUA will remain  in effect (meaning this test can be used) for the duration of the COVID-19 declaration under Section 564(b)(1) of the Act, 21 U.S.C.section 360bbb-3(b)(1), unless the authorization is terminated  or revoked sooner.       Influenza A by PCR NEGATIVE NEGATIVE Final   Influenza B by PCR NEGATIVE NEGATIVE Final    Comment: (NOTE) The Xpert Xpress SARS-CoV-2/FLU/RSV plus assay is intended as an aid in the diagnosis of influenza from Nasopharyngeal swab specimens and should not be used as a sole basis for treatment. Nasal washings and aspirates are unacceptable for Xpert Xpress SARS-CoV-2/FLU/RSV testing.  Fact Sheet for Patients: BloggerCourse.com  Fact Sheet for Healthcare Providers: SeriousBroker.it  This test is not yet approved or cleared by the [place] FDA and has been authorized for detection and/or diagnosis of SARS-CoV-2 by FDA under an Emergency Use Authorization (EUA). This EUA will remain in effect  (meaning this test can be used) for the duration of the COVID-19 declaration under Section 564(b)(1) of the Act, 21 U.S.C. section 360bbb-3(b)(1), unless the authorization is terminated or revoked.     Resp Syncytial Virus by PCR NEGATIVE NEGATIVE Final    Comment: (NOTE) Fact Sheet for Patients: BloggerCourse.com  Fact Sheet for Healthcare Providers: SeriousBroker.it  This test is not yet approved or cleared by the [place] FDA and has been authorized for detection and/or diagnosis of SARS-CoV-2 by FDA under an Emergency Use Authorization (EUA). This EUA will remain in effect (meaning this test can be used) for the duration of the COVID-19 declaration under Section 564(b)(1) of the Act, 21 U.S.C. section 360bbb-3(b)(1), unless the authorization is terminated or revoked.  Performed at [facility], [address] [place], [place] [address]   Blood culture (routine x 2)     Status: Abnormal   Collection Time: 02/05/24  2:40 PM   Specimen: BLOOD  Result Value Ref Range Status   Specimen Description   Final    BLOOD LEFT ANTECUBITAL Performed at [facility], [address] [place], [place] [address]    Special Requests   Final    BOTTLES DRAWN AEROBIC AND ANAEROBIC Blood Culture adequate volume Performed at [facility], [address] [place], [place] [address]    Culture  Setup Time   Final    GRAM POSITIVE COCCI IN CLUSTERS ANAEROBIC BOTTLE ONLY CRITICAL RESULT CALLED TO, READ BACK BY AND VERIFIED WITH: PHARMD LISA Vassie Loll [id number] AT 1740 BY EC    Culture (A)  Final    STAPHYLOCOCCUS HOMINIS THE SIGNIFICANCE OF ISOLATING THIS ORGANISM FROM A SINGLE SET OF BLOOD CULTURES WHEN MULTIPLE SETS ARE DRAWN IS UNCERTAIN. PLEASE NOTIFY THE MICROBIOLOGY DEPARTMENT WITHIN ONE WEEK IF SPECIATION AND SENSITIVITIES ARE REQUIRED. Performed at [facility], [address], [place], [place]  [address]    Report Status 02/08/2024 FINAL  Final  Blood Culture ID Panel (Reflexed)     Status: Abnormal   Collection Time: 02/05/24  2:40 PM  Result Value Ref Range Status   Enterococcus faecalis NOT DETECTED NOT DETECTED Final   Enterococcus Faecium NOT DETECTED NOT DETECTED Final   Listeria monocytogenes NOT DETECTED NOT DETECTED Final   Staphylococcus species DETECTED (A) NOT DETECTED Final    Comment: CRITICAL RESULT CALLED TO, READ BACK BY AND VERIFIED WITH: PHARMD Leota Sauers [id number] AT 1740 BY EC    Staphylococcus aureus (BCID) NOT DETECTED NOT DETECTED Final   Staphylococcus epidermidis NOT DETECTED NOT DETECTED Final   Staphylococcus lugdunensis NOT DETECTED NOT DETECTED Final   Streptococcus species NOT DETECTED NOT DETECTED Final   Streptococcus agalactiae NOT DETECTED NOT DETECTED Final   Streptococcus pneumoniae NOT DETECTED NOT DETECTED Final   Streptococcus pyogenes NOT DETECTED NOT DETECTED Final   A.calcoaceticus-baumannii NOT DETECTED NOT DETECTED Final   Bacteroides fragilis NOT DETECTED NOT DETECTED Final   Enterobacterales NOT DETECTED NOT DETECTED Final   Enterobacter cloacae complex NOT DETECTED NOT DETECTED Final   Escherichia coli NOT DETECTED NOT DETECTED Final   Klebsiella aerogenes NOT DETECTED NOT DETECTED Final   Klebsiella oxytoca NOT DETECTED NOT DETECTED Final   Klebsiella pneumoniae NOT DETECTED NOT DETECTED Final   Proteus species NOT DETECTED NOT DETECTED Final   Salmonella species NOT DETECTED NOT DETECTED Final   Serratia marcescens NOT DETECTED NOT DETECTED Final   Haemophilus influenzae NOT DETECTED NOT DETECTED Final   Neisseria meningitidis NOT DETECTED NOT DETECTED Final   Pseudomonas aeruginosa NOT DETECTED NOT DETECTED Final   Stenotrophomonas maltophilia NOT DETECTED NOT DETECTED Final   Candida albicans NOT DETECTED NOT DETECTED Final   Candida auris NOT DETECTED NOT DETECTED Final   Candida glabrata NOT DETECTED NOT DETECTED Final    Candida krusei NOT DETECTED NOT DETECTED Final   Candida parapsilosis NOT DETECTED NOT DETECTED Final   Candida tropicalis NOT DETECTED NOT DETECTED Final   Cryptococcus neoformans/gattii NOT DETECTED NOT DETECTED Final    Comment: Performed at [facility], [address] [place], [place] [address]  Blood culture (routine x 2)     Status: None (Preliminary result)   Collection Time: 02/05/24  2:50 PM   Specimen: BLOOD LEFT HAND  Result Value Ref Range Status   Specimen Description   Final    BLOOD LEFT HAND Performed at [facility], [address] [place], [place] [address]    Special Requests   Final    BOTTLES DRAWN AEROBIC AND ANAEROBIC Blood Culture results may not be optimal due to an inadequate volume of blood received in culture bottles Performed at [facility], [address] [place], [place] [address]    Culture   Final    NO GROWTH 4 DAYS Performed at [facility], [address], [place], [place] [address]    Report Status PENDING  Incomplete    Time coordinating discharge: 45 minutes  Signed: Breeanna Galgano  [name] 02/09/2024, 9:26 AM

## 2024-02-10 ENCOUNTER — Telehealth: Payer: Self-pay | Admitting: *Deleted

## 2024-02-10 LAB — CULTURE, BLOOD (ROUTINE X 2): Culture: NO GROWTH

## 2024-02-10 NOTE — Transitions of Care (Post Inpatient/ED Visit) (Signed)
 02/10/2024  Name: [name] MRN: [id number] DOB: 12/25/1985  Today's TOC FU Call Status: Today's TOC FU Call Status:: Successful TOC FU Call Completed TOC FU Call Complete Date: 02/10/24 Patient's Name and Date of Birth confirmed.  Transition Care Management Follow-up Telephone Call Date of Discharge: 02/09/24 Discharge Facility: [facility]) Type of Discharge: Inpatient Admission Primary Inpatient Discharge Diagnosis:: aspiration pneumonia in setting of CP How have you been since you were released from the hospital?: Better (per mother/ caregiver: "He is better; last night was a little rough- but he is better today; we are taking back to the facility he lives in tomorrow.  Thanks for getting the appointment scheduled with Ladona Ridgel for next week.") Any questions or concerns?: No  Items Reviewed: Did you receive and understand the discharge instructions provided?: Yes (thoroughly reviewed with patient who verbalizes good understanding of same) Medications obtained,verified, and reconciled?: Partial Review Completed Reason for Partial Mediation Review: Caregiver/ mother declined full review: "his list is too long to go over; nothing changed except for the new antibiotic" Any new allergies since your discharge?: No Dietary orders reviewed?: Yes Type of Diet Ordered:: "Standard tube feeding" per caregiver/ mother report Do you have support at home?: Yes People in Home [RPT]: parent(s) Name of Support/Comfort Primary Source: Mother reports requires total care for all self-care activities; facility caregivers and parents assists with all care needs:  patient is to return to facility "tomorrow 02/10/24"  Medications Reviewed Today: Medications Reviewed Today     Reviewed by Michaela Corner, RN (Registered Nurse) on 02/10/24 at 1227  Med List Status: <None>   Medication Order Taking? Sig Documenting Provider Last Dose Status Informant  albuterol (PROVENTIL) (2.5 MG/3ML) 0.083%  nebulizer solution [id number]  Take 3 mLs (2.5 mg total) by nebulization every 4 (four) hours as needed for wheezing or shortness of breath. Clayborne Dana, NP  Active Mother, Pharmacy Records  dantrolene (DANTRIUM) 100 MG capsule [id number]  Take 1 capsule (100 mg total) by mouth 3 (three) times daily.  Patient taking differently: Place 100 mg into feeding tube 3 (three) times daily.   Ranelle Oyster, MD  Active Mother, Pharmacy Records  DEPAKOTE SPRINKLES 125 MG capsule [id number]  Take 250-375 mg by mouth See admin instructions. Take 375mg  (3 capsules) by mouth at 0900, 250mg  (2 capsules) at 2030, and 250mg  (2 capsules) at 2130. [provider]  Active Mother, Pharmacy Records  Dextromethorphan-guaiFENesin 5-100 MG/5ML LIQD [id number]  Give 20 mLs by tube every 6 (six) hours as needed (Cough). [provider]  Active Mother, Pharmacy Records  diazepam (VALIUM) 5 MG tablet [id number]  Place 5 mg into feeding tube daily as needed for anxiety. [provider]  Active Mother, Pharmacy Records  doxepin (SINEQUAN) 25 MG capsule [id number]  Place 25 mg into feeding tube at bedtime. [provider]  Active Mother, Pharmacy Records  escitalopram (LEXAPRO) 5 MG/5ML solution [id number]  Place 20 mLs into feeding tube daily. [provider]  Active Mother, Pharmacy Records  HYDROcodone bit-homatropine (HYDROMET) 5-1.5 MG/5ML syrup [id number]  Place 5 mLs into feeding tube every 6 (six) hours as needed for cough. [provider]  Active Mother, Pharmacy Records  lamoTRIgine (LAMICTAL) 25 MG tablet [id number]  TAKE 2 TABLETS BY MOUTH 2 TIMES DAILY.  Patient taking differently: Place 50 mg into feeding tube 2 (two) times daily.   Levert Feinstein, MD  Active Mother, Pharmacy Records  levofloxacin [facility]) 25 MG/ML solution [id number] Yes Take 30 mLs (  750 mg total) by mouth daily for 5 days. Lorin Glass, MD Taking Active   LORazepam (ATIVAN) 1 MG tablet [id number]  Place  1 mg into feeding tube at bedtime. [provider]  Active Mother, Pharmacy Records  Nutritional Supplements (FEEDING SUPPLEMENT, KATE FARMS STANDARD 1.4,) LIQD liquid [id number]  Take 325 mLs by mouth as directed. 3 carton over 16 hrs [provider]  Active Mother, Pharmacy Records  OLANZapine (ZYPREXA) 10 MG tablet [id number]  Place 10 mg into feeding tube at bedtime. [provider]  Active Mother, Pharmacy Records  OLANZapine [facility]) 5 MG tablet [id number]  Place 5 mg into feeding tube daily. @1700  [provider]  Active Mother, Pharmacy Records  omeprazole (PRILOSEC) 20 MG capsule [id number]  Take 2 capsules (40 mg total) by mouth daily.  Patient not taking: Reported on 02/06/2024   Wonda Amis  Active Mother, Pharmacy Records  OVER THE COUNTER MEDICATION [id number]  Give 1 capsule by tube 2 (two) times daily. Muscle Calm Formula [provider]  Active Mother, Pharmacy Records  OVER THE COUNTER MEDICATION [id number]  Give 1 capsule by tube at bedtime. CBD/Melaonin [provider]  Active Mother, Pharmacy Records  oxyBUTYnin [facility]) 5 MG/5ML solution [id number]  Place 7.5 mg into feeding tube at bedtime. [provider]  Active Mother, Pharmacy Records  Saccharomyces boulardii (FLORASTOR PO) [id number]  Give 1 capsule by tube every evening. [provider]  Active Mother, Pharmacy Records  sucralfate (CARAFATE) 1 g tablet [id number]  Take 1 tablet (1 g total) by mouth daily. TAKE 1 TABLET BY MOUTH 4 TIMES DAILY WITH MEALS AND AT BEDTIME  Patient taking differently: Place 1 g into feeding tube daily.   Zehr, Daine Floras  Active Mother, Pharmacy Records           Med Note Lum Keas Feb 06, 2024  7:46 AM) Mom confirmed 1QD.  traZODone (DESYREL) 150 MG tablet [id number]  Place 150 mg into feeding tube at bedtime. [provider]  Active Mother, Pharmacy Records           Home Care and  Equipment/Supplies: Were Home Health Services Ordered?: No Any new equipment or medical supplies ordered?: No  Functional Questionnaire: Do you need assistance with bathing/showering or dressing?: Yes (facility caregivers and parents provide all care for patient) Do you need assistance with meal preparation?: Yes (facility caregivers and parents provide all care for patient) Do you need assistance with eating?: Yes (facility caregivers and parents provide all care for patient) Do you have difficulty maintaining continence: Yes (facility caregivers and parents provide all care for patient) Do you need assistance with getting out of bed/getting out of a chair/moving?: Yes (facility caregivers and parents provide all care for patient) Do you have difficulty managing or taking your medications?: Yes (facility caregivers and parents provide all care for patient)  Follow up appointments reviewed: PCP Follow-up appointment confirmed?: Yes (care coordination outreach in real-time with scheduling care guide to successfully schedule hospital follow up PCP appointment 02/16/24) Date of PCP follow-up appointment?: 02/16/24 Follow-up Provider: PCP- covering provider/ Hyman Hopes, NP Specialist Hospital Follow-up appointment confirmed?: NA Do you need transportation to your follow-up appointment?: No Do you understand care options if your condition(s) worsen?: Yes-patient verbalized understanding  SDOH Interventions Today    Flowsheet Row Most Recent Value  SDOH Interventions   Food Insecurity Interventions Intervention Not Indicated  Housing Interventions Intervention Not Indicated  [lives in permanent facility for  CP patients per mother's report: "[facility]"]  Transportation Interventions Intervention Not Indicated  [parents provide transportation]  Utilities Interventions Intervention Not Indicated      Total time spent from review to signing of note/ including any care coordination interventions:   45 minutes  Pls call/ message for questions,  Caryl Pina, RN, BSN, Media planner  Transitions of Care  [facility] [phone]: direct office

## 2024-02-11 DIAGNOSIS — F339 Major depressive disorder, recurrent, unspecified: Secondary | ICD-10-CM | POA: Diagnosis not present

## 2024-02-11 DIAGNOSIS — G4700 Insomnia, unspecified: Secondary | ICD-10-CM | POA: Diagnosis not present

## 2024-02-16 ENCOUNTER — Ambulatory Visit (INDEPENDENT_AMBULATORY_CARE_PROVIDER_SITE_OTHER): Admitting: Family Medicine

## 2024-02-16 ENCOUNTER — Encounter: Payer: Self-pay | Admitting: Family Medicine

## 2024-02-16 VITALS — BP 123/73 | HR 99 | Temp 97.1°F

## 2024-02-16 DIAGNOSIS — Z09 Encounter for follow-up examination after completed treatment for conditions other than malignant neoplasm: Secondary | ICD-10-CM

## 2024-02-16 DIAGNOSIS — R052 Subacute cough: Secondary | ICD-10-CM

## 2024-02-16 DIAGNOSIS — J189 Pneumonia, unspecified organism: Secondary | ICD-10-CM | POA: Diagnosis not present

## 2024-02-16 MED ORDER — HYDROCODONE BIT-HOMATROP MBR 5-1.5 MG/5ML PO SOLN: 5.0000 mL | Freq: Four times a day (QID) | ORAL | 0 refills | Status: DC | PRN

## 2024-02-16 NOTE — Progress Notes (Signed)
 Acute Office Visit  Subjective:     Patient ID: [name], male    DOB: September 24, 1986, 38 y.o.   MRN: [id number]  Chief Complaint  Patient presents with   Hospitalization Follow-up    HPI Patient is in today for hospital follow-up.     Discussed the use of AI scribe software for clinical note transcription with the patient, who gave verbal consent to proceed.  History of Present Illness [name] "[name]" is a 38 year old male who presents for follow-up after hospitalization for pneumonia and a urinary tract infection.  He was hospitalized from April 5th to April 9th for pneumonia (likely aspiration following dental procedure) and a urinary tract infection. Initially, he received IV antibiotics and was discharged with additional five-day course of Levaquin, which he completed on Monday, April 14th. He spent two days at home recovering before returning to the assisted living facility.  Since returning home, he has been taking hydromet syrup in the morning to manage a persistent cough, which has improved his symptoms without causing excessive drowsiness. He also uses Mucinex DM at night, which has helped him. He has been sleeping well. He continues to use an airway clearance vest twice daily to aid in respiratory function. No breathing treatments have been necessary, although available if needed.  Reports he is feeling much better and is excited about his new roommate moving in tomorrow!     ROS All review of systems negative except what is listed in the HPI      Objective:    BP 123/73   Pulse 99   Temp (!) 97.1 F (36.2 C) (Temporal)   SpO2 97%    Physical Exam Vitals reviewed.  Constitutional:      General: He is not in acute distress.    Appearance: Normal appearance. He is not ill-appearing.  Cardiovascular:     Rate and Rhythm: Normal rate and regular rhythm.  Pulmonary:     Effort: Pulmonary effort is normal.     Breath sounds: Normal breath sounds.  No wheezing or rhonchi.  Skin:    General: Skin is warm and dry.  Neurological:     Mental Status: He is alert and oriented to person, place, and time.  Psychiatric:        Mood and Affect: Mood normal.        Behavior: Behavior normal.        Thought Content: Thought content normal.        Judgment: Judgment normal.         No results found for any visits on 02/16/24.      Assessment & Plan:   Problem List Items Addressed This Visit   None Visit Diagnoses       Subacute cough    -  Primary   Relevant Medications   HYDROcodone bit-homatropine (HYDROMET) 5-1.5 MG/5ML syrup     Hospital discharge follow-up       Relevant Orders   CBC with Differential/Platelet   Basic metabolic panel with GFR     Pneumonia of right lower lobe due to infectious organism       Relevant Medications   HYDROcodone bit-homatropine (HYDROMET) 5-1.5 MG/5ML syrup   Other Relevant Orders   CBC with Differential/Platelet   Basic metabolic panel with GFR      Assessment & Plan Pneumonia/Hospital Follow-up Symptoms improved post IV and oral antibiotics. Hydromet syrup effective for residual cough. - Continue Hydromet syrup as needed, taper as  symptoms improve. - Refill Hydromet syrup prescription. Caution for drowsiness.  - Continue airway clearance vest therapy twice daily. - Recheck metabolic panel and CBC to ensure returning to baseline.       Meds ordered this encounter  Medications   HYDROcodone bit-homatropine (HYDROMET) 5-1.5 MG/5ML syrup    Sig: Place 5 mLs into feeding tube every 6 (six) hours as needed for cough.    Dispense:  120 mL    Refill:  0    Supervising Provider:   Randie Bustle A [[id number]]    Return if symptoms worsen or fail to improve.  Everlina Hock, NP   I spent 30 minutes dedicated to the care of this patient on the date of this encounter to include pre-visit chart review of prior notes and results, face-to-face time with the patient performing a medically  appropriate exam, counseling/education regarding hospitalization for recent pneumonia, and post-visit documentation and ordering of meds and repeat labs as indicated.

## 2024-02-17 ENCOUNTER — Encounter: Payer: Self-pay | Admitting: Family Medicine

## 2024-02-17 LAB — CBC WITH DIFFERENTIAL/PLATELET
Basophils Absolute: 0.1 10*3/uL (ref 0.0–0.1)
Basophils Relative: 1.4 % (ref 0.0–3.0)
Eosinophils Absolute: 0.1 10*3/uL (ref 0.0–0.7)
Eosinophils Relative: 3.2 % (ref 0.0–5.0)
HCT: 42.3 % (ref 39.0–52.0)
Hemoglobin: 14.3 g/dL (ref 13.0–17.0)
Lymphocytes Relative: 38.2 % (ref 12.0–46.0)
Lymphs Abs: 1.7 10*3/uL (ref 0.7–4.0)
MCHC: 33.8 g/dL (ref 30.0–36.0)
MCV: 94.8 fl (ref 78.0–100.0)
Monocytes Absolute: 0.4 10*3/uL (ref 0.1–1.0)
Monocytes Relative: 8.6 % (ref 3.0–12.0)
Neutro Abs: 2.1 10*3/uL (ref 1.4–7.7)
Neutrophils Relative %: 48.6 % (ref 43.0–77.0)
Platelets: 278 10*3/uL (ref 150.0–400.0)
RBC: 4.45 Mil/uL (ref 4.22–5.81)
RDW: 13.1 % (ref 11.5–15.5)
WBC: 4.3 10*3/uL (ref 4.0–10.5)

## 2024-02-17 LAB — BASIC METABOLIC PANEL WITH GFR
BUN: 8 mg/dL (ref 6–23)
CO2: 27 meq/L (ref 19–32)
Calcium: 9.1 mg/dL (ref 8.4–10.5)
Chloride: 99 meq/L (ref 96–112)
Creatinine, Ser: 0.28 mg/dL — ABNORMAL LOW (ref 0.40–1.50)
GFR: 152.62 mL/min (ref >60.00)
Glucose, Bld: 86 mg/dL (ref 70–99)
Potassium: 4.3 meq/L (ref 3.5–5.1)
Sodium: 136 meq/L (ref 135–145)

## 2024-02-29 ENCOUNTER — Encounter: Payer: Self-pay | Admitting: Family Medicine

## 2024-02-29 DIAGNOSIS — L309 Dermatitis, unspecified: Secondary | ICD-10-CM

## 2024-03-01 ENCOUNTER — Emergency Department (HOSPITAL_BASED_OUTPATIENT_CLINIC_OR_DEPARTMENT_OTHER)
Admission: EM | Admit: 2024-03-01 | Discharge: 2024-03-01 | Disposition: A | Discharge status: Home / Self Care | Attending: Emergency Medicine | Admitting: Emergency Medicine

## 2024-03-01 ENCOUNTER — Other Ambulatory Visit: Payer: Self-pay

## 2024-03-01 ENCOUNTER — Telehealth: Payer: Self-pay | Admitting: Neurology

## 2024-03-01 ENCOUNTER — Emergency Department (HOSPITAL_BASED_OUTPATIENT_CLINIC_OR_DEPARTMENT_OTHER)

## 2024-03-01 ENCOUNTER — Encounter (HOSPITAL_BASED_OUTPATIENT_CLINIC_OR_DEPARTMENT_OTHER): Payer: Self-pay | Admitting: Emergency Medicine

## 2024-03-01 DIAGNOSIS — R54 Age-related physical debility: Secondary | ICD-10-CM | POA: Diagnosis present

## 2024-03-01 DIAGNOSIS — Z993 Dependence on wheelchair: Secondary | ICD-10-CM | POA: Diagnosis not present

## 2024-03-01 DIAGNOSIS — Z681 Body mass index (BMI) 19 or less, adult: Secondary | ICD-10-CM | POA: Diagnosis not present

## 2024-03-01 DIAGNOSIS — E43 Unspecified severe protein-calorie malnutrition: Secondary | ICD-10-CM | POA: Diagnosis not present

## 2024-03-01 DIAGNOSIS — E16A2 Hypoglycemia level 2: Secondary | ICD-10-CM | POA: Diagnosis present

## 2024-03-01 DIAGNOSIS — K9423 Gastrostomy malfunction: Secondary | ICD-10-CM | POA: Diagnosis present

## 2024-03-01 DIAGNOSIS — Z885 Allergy status to narcotic agent status: Secondary | ICD-10-CM | POA: Diagnosis not present

## 2024-03-01 DIAGNOSIS — Z881 Allergy status to other antibiotic agents status: Secondary | ICD-10-CM | POA: Diagnosis not present

## 2024-03-01 DIAGNOSIS — Z880 Allergy status to penicillin: Secondary | ICD-10-CM | POA: Diagnosis not present

## 2024-03-01 DIAGNOSIS — J690 Pneumonitis due to inhalation of food and vomit: Secondary | ICD-10-CM | POA: Diagnosis not present

## 2024-03-01 DIAGNOSIS — K219 Gastro-esophageal reflux disease without esophagitis: Secondary | ICD-10-CM | POA: Diagnosis present

## 2024-03-01 DIAGNOSIS — F32A Depression, unspecified: Secondary | ICD-10-CM | POA: Diagnosis present

## 2024-03-01 DIAGNOSIS — J189 Pneumonia, unspecified organism: Secondary | ICD-10-CM | POA: Diagnosis not present

## 2024-03-01 DIAGNOSIS — G800 Spastic quadriplegic cerebral palsy: Secondary | ICD-10-CM | POA: Diagnosis present

## 2024-03-01 DIAGNOSIS — R509 Fever, unspecified: Secondary | ICD-10-CM | POA: Diagnosis not present

## 2024-03-01 DIAGNOSIS — R1314 Dysphagia, pharyngoesophageal phase: Secondary | ICD-10-CM | POA: Diagnosis present

## 2024-03-01 DIAGNOSIS — R197 Diarrhea, unspecified: Secondary | ICD-10-CM | POA: Diagnosis present

## 2024-03-01 DIAGNOSIS — R0602 Shortness of breath: Secondary | ICD-10-CM | POA: Diagnosis not present

## 2024-03-01 DIAGNOSIS — K9419 Other complications of enterostomy: Secondary | ICD-10-CM | POA: Diagnosis not present

## 2024-03-01 DIAGNOSIS — F418 Other specified anxiety disorders: Secondary | ICD-10-CM | POA: Diagnosis not present

## 2024-03-01 DIAGNOSIS — R17 Unspecified jaundice: Secondary | ICD-10-CM | POA: Diagnosis present

## 2024-03-01 DIAGNOSIS — Z882 Allergy status to sulfonamides status: Secondary | ICD-10-CM | POA: Diagnosis not present

## 2024-03-01 DIAGNOSIS — A419 Sepsis, unspecified organism: Secondary | ICD-10-CM | POA: Diagnosis not present

## 2024-03-01 DIAGNOSIS — Z79899 Other long term (current) drug therapy: Secondary | ICD-10-CM | POA: Diagnosis not present

## 2024-03-01 DIAGNOSIS — Z66 Do not resuscitate: Secondary | ICD-10-CM | POA: Diagnosis present

## 2024-03-01 DIAGNOSIS — E785 Hyperlipidemia, unspecified: Secondary | ICD-10-CM | POA: Diagnosis present

## 2024-03-01 DIAGNOSIS — E876 Hypokalemia: Secondary | ICD-10-CM | POA: Diagnosis present

## 2024-03-01 DIAGNOSIS — E162 Hypoglycemia, unspecified: Secondary | ICD-10-CM | POA: Diagnosis not present

## 2024-03-01 DIAGNOSIS — E161 Other hypoglycemia: Secondary | ICD-10-CM | POA: Diagnosis not present

## 2024-03-01 DIAGNOSIS — E872 Acidosis, unspecified: Secondary | ICD-10-CM | POA: Diagnosis present

## 2024-03-01 LAB — CBC WITH DIFFERENTIAL/PLATELET
Abs Immature Granulocytes: 0.03 10*3/uL (ref 0.00–0.07)
Basophils Absolute: 0 10*3/uL (ref 0.0–0.1)
Basophils Relative: 0 %
Eosinophils Absolute: 0 10*3/uL (ref 0.0–0.5)
Eosinophils Relative: 1 %
HCT: 41.7 % (ref 39.0–52.0)
Hemoglobin: 14.3 g/dL (ref 13.0–17.0)
Immature Granulocytes: 0 %
Lymphocytes Relative: 24 %
Lymphs Abs: 2 10*3/uL (ref 0.7–4.0)
MCH: 31.6 pg (ref 26.0–34.0)
MCHC: 34.3 g/dL (ref 30.0–36.0)
MCV: 92.3 fL (ref 80.0–100.0)
Monocytes Absolute: 0.5 10*3/uL (ref 0.1–1.0)
Monocytes Relative: 6 %
Neutro Abs: 5.8 10*3/uL (ref 1.7–7.7)
Neutrophils Relative %: 69 %
Platelets: 220 10*3/uL (ref 150–400)
RBC: 4.52 MIL/uL (ref 4.22–5.81)
RDW: 12.3 % (ref 11.5–15.5)
WBC: 8.4 10*3/uL (ref 4.0–10.5)
nRBC: 0 % (ref 0.0–0.2)

## 2024-03-01 LAB — COMPREHENSIVE METABOLIC PANEL WITH GFR
ALT: 14 U/L (ref 0–44)
AST: 19 U/L (ref 15–41)
Albumin: 4.5 g/dL (ref 3.5–5.0)
Alkaline Phosphatase: 74 U/L (ref 38–126)
Anion gap: 16 — ABNORMAL HIGH (ref 5–15)
BUN: 6 mg/dL (ref 6–20)
CO2: 22 mmol/L (ref 22–32)
Calcium: 9.6 mg/dL (ref 8.9–10.3)
Chloride: 97 mmol/L — ABNORMAL LOW (ref 98–111)
Creatinine, Ser: <0.3 mg/dL — ABNORMAL LOW (ref 0.61–1.24)
Glucose, Bld: 88 mg/dL (ref 70–99)
Potassium: 3.9 mmol/L (ref 3.5–5.1)
Sodium: 135 mmol/L (ref 135–145)
Total Bilirubin: 0.4 mg/dL (ref 0.0–1.2)
Total Protein: 7.4 g/dL (ref 6.5–8.1)

## 2024-03-01 LAB — PROTIME-INR
INR: 1 (ref 0.8–1.2)
Prothrombin Time: 13.2 s (ref 11.4–15.2)

## 2024-03-01 LAB — URINALYSIS, W/ REFLEX TO CULTURE (INFECTION SUSPECTED)
Bilirubin Urine: NEGATIVE
Glucose, UA: NEGATIVE mg/dL
Hgb urine dipstick: NEGATIVE
Ketones, ur: >=80 mg/dL — AB
Leukocytes,Ua: NEGATIVE
Nitrite: NEGATIVE
Protein, ur: 30 mg/dL — AB
Specific Gravity, Urine: 1.02 (ref 1.005–1.030)
pH: 7 (ref 5.0–8.0)

## 2024-03-01 LAB — LACTIC ACID, PLASMA: Lactic Acid, Venous: 0.7 mmol/L (ref 0.5–1.9)

## 2024-03-01 MED ORDER — CLOTRIMAZOLE-BETAMETHASONE 1-0.05 % EX CREA: 1.0000 | TOPICAL_CREAM | Freq: Two times a day (BID) | CUTANEOUS | 1 refills | Status: AC | PRN

## 2024-03-01 MED ORDER — LIDOCAINE HCL URETHRAL/MUCOSAL 2 % EX GEL
1.0000 | Freq: Once | CUTANEOUS | Status: DC
  Filled 2024-03-01: qty 11

## 2024-03-01 MED ORDER — ONDANSETRON HCL 4 MG/2ML IJ SOLN
4.0000 mg | Freq: Once | INTRAMUSCULAR | Status: AC
  Administered 2024-03-01: 4 mg via INTRAVENOUS
  Filled 2024-03-01: qty 2

## 2024-03-01 MED ORDER — LEVOFLOXACIN 750 MG PO TABS: 750.0000 mg | ORAL_TABLET | Freq: Every day | ORAL | 0 refills | Status: DC

## 2024-03-01 MED ORDER — LACTATED RINGERS IV SOLN: INTRAVENOUS | Status: DC

## 2024-03-01 MED ORDER — ALBUTEROL SULFATE (2.5 MG/3ML) 0.083% IN NEBU
2.5000 mg | INHALATION_SOLUTION | Freq: Once | RESPIRATORY_TRACT | Status: AC
  Administered 2024-03-01: 2.5 mg via RESPIRATORY_TRACT
  Filled 2024-03-01: qty 3

## 2024-03-01 MED ORDER — LACTATED RINGERS IV BOLUS
1000.0000 mL | Freq: Once | INTRAVENOUS | Status: AC
  Administered 2024-03-01: 1000 mL via INTRAVENOUS

## 2024-03-01 MED ORDER — FENTANYL CITRATE PF 50 MCG/ML IJ SOSY
50.0000 ug | PREFILLED_SYRINGE | Freq: Once | INTRAMUSCULAR | Status: AC
  Administered 2024-03-01: 50 ug via INTRAVENOUS
  Filled 2024-03-01: qty 1

## 2024-03-01 MED ORDER — LEVOFLOXACIN 750 MG PO TABS
750.0000 mg | ORAL_TABLET | Freq: Every day | ORAL | 0 refills | Status: DC
Start: 2024-03-01 — End: 2024-03-01

## 2024-03-01 NOTE — ED Triage Notes (Signed)
 C/o cough, "dark brown mucus while suctioning, and fever since Sunday. Hx of cerebral palsy.   Tylenol  given around 1100.

## 2024-03-01 NOTE — ED Notes (Signed)
 Gtube dressing reinforced

## 2024-03-01 NOTE — ED Notes (Signed)
 RT Note: Mom states the patient gets Albuterol  breathing treatments at the home he lives in. The pient is having periods of gasping to breath . SPO2 is good. Patient was ordered an Albuterol ,neb treatment

## 2024-03-01 NOTE — Discharge Instructions (Signed)
 Contact a health care provider if: You have a fever. You cough or choke when you eat or drink. You keep having signs or symptoms of this condition. Get help right away if: You have trouble breathing that gets worse. You have chest pain. These symptoms may be an emergency. Get help right away. Call 911. Do not wait to see if symptoms will go away. Do not drive yourself to the hospital.

## 2024-03-01 NOTE — Telephone Encounter (Signed)
 Looks like patient is currently in the ED.   Copied from CRM [id number]. Topic: General - Other >> Mar 01, 2024 11:23 AM Bambi Bonine D wrote: Reason for CRM: [name] called in and stated that the patient has a fever of 101.1 and is on day 3. Patient and been coughing as well and she would like for Minna Amass, NP, to reach out regarding the concern.

## 2024-03-01 NOTE — Telephone Encounter (Signed)
 Thanks, agree he needs to be evaluated. Follow-up after discharge.

## 2024-03-01 NOTE — ED Notes (Signed)
 RT Note: Physician wanted patient deep suctioned due to audible rhonchi in back of throat. Patient was pre-oxygenated and deep suctioned thru left nare. 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 Patient tolerated well. He had a moderate amount of thick yellow, white secretions.  A Yankauer was then used and retrieved more secretions that the patient was able to cough up after the deep suction. Patient SPO2 98% through out procedure

## 2024-03-01 NOTE — ED Provider Notes (Signed)
 [place] [facility] Provider Note   CSN: [id number] Arrival date & time: 03/01/24  1405     History  Chief Complaint  Patient presents with   Shortness of Breath    [name] is a 38 y.o. male with a past history of cerebral palsy who is brought in by his mother for evaluation of fever and productive cough.  His mother states that the patient has had now 3 upper respiratory infections 1 of which resulted in admission with IV antibiotics.  He has been living at a new care home over the last 3 months and she is concerned because of his recurrent episodes of pneumonia.  He is fed through a PEG tube and she noticed some discharge around the PEG tube and the patient nods yes when asked if it is painful.  She states that he had a fever up to 101 Fahrenheit prior to arrival and was given liquid Tylenol  and his G-tube.  She states that the patient frequently has debris in his mouth and she feels like the facility has not been appropriately making sure to clear his airway is concerned he might be aspirating the food in his mouth.   Shortness of Breath      Home Medications Prior to Admission medications   Medication Sig Start Date End Date Taking? Authorizing Provider  albuterol  (PROVENTIL ) (2.5 MG/3ML) 0.083% nebulizer solution Take 3 mLs (2.5 mg total) by nebulization every 4 (four) hours as needed for wheezing or shortness of breath. 01/20/24   Everlina Hock, NP  clotrimazole -betamethasone  (LOTRISONE ) cream Apply 1 Application topically 2 (two) times daily as needed. 03/01/24   Everlina Hock, NP  dantrolene  (DANTRIUM ) 100 MG capsule Take 1 capsule (100 mg total) by mouth 3 (three) times daily. Patient taking differently: Place 100 mg into feeding tube 3 (three) times daily. 01/12/24   Rawland Caddy, MD  DEPAKOTE  SPRINKLES 125 MG capsule Take 250-375 mg by mouth See admin instructions. Take 375mg  (3 capsules) by mouth at 0900, 250mg  (2 capsules)  at 2030, and 250mg  (2 capsules) at 2130. 06/13/21   [provider]  Dextromethorphan -guaiFENesin  5-100 MG/5ML LIQD Give 20 mLs by tube every 6 (six) hours as needed (Cough).    [provider]  diazepam  (VALIUM ) 5 MG tablet Place 5 mg into feeding tube daily as needed for anxiety.    [provider]  doxepin  (SINEQUAN ) 25 MG capsule Place 25 mg into feeding tube at bedtime. 04/24/23   [provider]  escitalopram  (LEXAPRO ) 5 MG/5ML solution Place 20 mLs into feeding tube daily. 03/24/23   [provider]  HYDROcodone  bit-homatropine (HYDROMET) 5-1.5 MG/5ML syrup Place 5 mLs into feeding tube every 6 (six) hours as needed for cough. 02/16/24   Everlina Hock, NP  lamoTRIgine  (LAMICTAL ) 25 MG tablet TAKE 2 TABLETS BY MOUTH 2 TIMES DAILY. Patient taking differently: Place 50 mg into feeding tube 2 (two) times daily. 08/09/23   Phebe Brasil, MD  LORazepam  (ATIVAN ) 1 MG tablet Place 1 mg into feeding tube at bedtime. 02/24/23   [provider]  Nutritional Supplements (FEEDING SUPPLEMENT, KATE FARMS STANDARD 1.4,) LIQD liquid Take 325 mLs by mouth as directed. 3 carton over 16 hrs    [provider]  OLANZapine  (ZYPREXA ) 10 MG tablet Place 10 mg into feeding tube at bedtime. 06/11/22   [provider]  OLANZapine  (ZYPREXA ) 5 MG tablet Place 5 mg into feeding tube daily. @1700  02/12/21  [provider]  omeprazole  (PRILOSEC) 20 MG capsule Take 2 capsules (40 mg total) by mouth daily. 12/27/23   Zehr, Jessica D, PA-C  OVER THE COUNTER MEDICATION Give 1 capsule by tube 2 (two) times daily. Muscle Calm Formula    [provider]  OVER THE COUNTER MEDICATION Give 1 capsule by tube at bedtime. CBD/Melaonin    [provider]  oxyBUTYnin  (DITROPAN ) 5 MG/5ML solution Place 7.5 mg into feeding tube at bedtime. 03/31/23   [provider]  Saccharomyces boulardii (FLORASTOR PO) Give 1 capsule by tube every evening.     [provider]  sucralfate  (CARAFATE ) 1 g tablet Take 1 tablet (1 g total) by mouth daily. TAKE 1 TABLET BY MOUTH 4 TIMES DAILY WITH MEALS AND AT BEDTIME Patient taking differently: Place 1 g into feeding tube daily. 10/08/23   Zehr, Jessica D, PA-C  traZODone  (DESYREL ) 150 MG tablet Place 150 mg into feeding tube at bedtime. 12/31/23   [provider]      Allergies    Ambien  [zolpidem  tartrate]; Antihistamines, chlorpheniramine-type; Augmentin  [amoxicillin -pot clavulanate]; Cefdinir ; Codeine ; Zolpidem ; Baclofen ; Metoclopramide ; Pheniramine; Sulfa antibiotics; and Sulfonamide derivatives    Review of Systems   Review of Systems  Respiratory:  Positive for shortness of breath.     Physical Exam Updated Vital Signs BP 112/76   Pulse (!) 105   Temp 99.8 F (37.7 C)   Resp 16   SpO2 95%  Physical Exam Vitals and nursing note reviewed.  Constitutional:      General: He is not in acute distress.    Appearance: He is well-developed. He is not diaphoretic.  HENT:     Head: Normocephalic and atraumatic.  Eyes:     General: No scleral icterus.    Extraocular Movements: Extraocular movements intact.     Conjunctiva/sclera: Conjunctivae normal.     Pupils: Pupils are equal, round, and reactive to light.  Cardiovascular:     Rate and Rhythm: Normal rate and regular rhythm.     Heart sounds: Normal heart sounds.  Pulmonary:     Effort: Pulmonary effort is normal. No respiratory distress.     Breath sounds: Normal breath sounds.  Abdominal:     Palpations: Abdomen is soft.     Tenderness: There is no abdominal tenderness.     Comments: French catheter is present in the patient's left abdomen.  There is some dark fluid exuding around the catheter site.  It is tender to palpation.  Musculoskeletal:     Cervical back: Normal range of motion and neck supple.     Comments: Significant thoracic and lumbar scoliosis with likely some element of restrictive airway disease  secondary to deformity.  Skin:    General: Skin is warm and dry.  Neurological:     Mental Status: He is alert.     Comments: Obvious sequela of cerebral palsy with bilateral limb contractures, small stature, atrophy of upper and lower extremities.  Psychiatric:        Behavior: Behavior normal.     ED Results / Procedures / Treatments   Labs (all labs ordered are listed, but only abnormal results are displayed) Labs Reviewed - No data to display  EKG None  Radiology No results found.  Procedures Procedures    Medications Ordered in ED Medications - No data to display  ED Course/ Medical Decision Making/ A&P Clinical Course as of 03/01/24 1908  Wed Mar 01, 2024  1519 Patient is c/o headache and  chest pain. 8/10 [AH]  1654 WBC: 8.4 [AH]  1654 Sodium: 135 [AH]  1654 Creatinine(!): <0.30 [AH]  1700 DG ABD ACUTE 2+V W 1V CHEST Visualized and interpreted two-view chest x-ray.  Patient does have a large stool burden but no evidence of obvious pneumonia on patient's chest x-ray.  Still waiting urinalysis.  [AH]  1855 Pulse Rate(!): 126 [AH]  1855 ECG Heart Rate(!): 125 [AH]  1855 Resp(!): 28 Patient's HR and resp rate elevated after Neb tx. [AH]    Clinical Course User Index [AH] Tama Fails, PA-C                                 Medical Decision Making Amount and/or Complexity of Data Reviewed Labs: ordered. Decision-making details documented in ED Course. Radiology: ordered. Decision-making details documented in ED Course.  Risk Prescription drug management.   This patient presents to the ED for concern of fever and productive cough, this involves an extensive number of treatment options, and is a complaint that carries with it a high risk of complications and morbidity.   Differential diagnosis for emergent cause of cough includes but is not limited to upper respiratory infection, lower respiratory infection, allergies, asthma, irritants, foreign body,  medications such as ACE inhibitors, reflux, asthma, CHF, lung cancer, interstitial lung disease, psychiatric causes, postnasal drip and postinfectious bronchospasm.   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   Social Determinants of Health:   SDOH Screenings   Food Insecurity: No Food Insecurity (02/10/2024)  Housing: Low Risk  (02/10/2024)  Transportation Needs: No Transportation Needs (02/10/2024)  Utilities: Not At Risk (02/10/2024)  Alcohol Screen: Low Risk  (10/05/2023)  Depression (PHQ2-9): Low Risk  (12/29/2023)  Financial Resource Strain: Low Risk  (01/20/2024)  Physical Activity: Inactive (01/20/2024)  Social Connections: Moderately Integrated (01/20/2024)  Stress: Stress Concern Present (01/20/2024)  Tobacco Use: Low Risk  (03/01/2024)  Health Literacy: Adequate Health Literacy (10/05/2023)     Additional history:  {Additional history obtained from mother {External records from outside source obtained and reviewed including Care everywhere and previous outside admissions  Lab Tests:  I Ordered, and personally interpreted labs.  The pertinent results include:      I reviewed the patient's labs  Urine shows no evidence of infection he does have ketones.  CMP shows no acute findings.  White blood cell count within normal limits, normal lactic acid level.  Imaging Studies: As per ed course Cardiac Monitoring/ECG:  The patient was  maintained on a cardiac monitor.  I personally viewed and interpreted the cardiac monitored which showed an underlying rhythm of: Sinus tachycardia especially after use of DuoNeb  Medicines ordered and prescription drug management:  I ordered medication including  Medications  lactated ringers  infusion (0 mLs Intravenous Stopped 03/01/24 1830)  lidocaine  (XYLOCAINE ) 2 % jelly 1 Application (has no administration in time range)  fentaNYL  (SUBLIMAZE ) injection 50 mcg (50 mcg Intravenous Given 03/01/24 1523)  ondansetron  (ZOFRAN ) injection 4 mg (4 mg Intravenous Given 03/01/24 1522)  fentaNYL  (SUBLIMAZE ) injection 50 mcg (50 mcg Intravenous Given 03/01/24  1752)  lactated ringers  bolus 1,000 mL (0 mLs Intravenous Stopped 03/01/24 1843)  albuterol  (PROVENTIL ) (2.5 MG/3ML) 0.083% nebulizer solution 2.5 mg (2.5 mg Nebulization Given 03/01/24 1808)   for pain, headache and cough, regularly scheduled neb treatment Reevaluation of the patient after these medicines showed that the patient improved I have reviewed the patients home medicines and have made adjustments as needed  Test Considered:   I considered a chest CT however given patient's clinical picture, reported home fever, productive sounding cough and history of recurrent aspiration pneumonia suspect that he has recurrence of this diagnosis.  Critical Interventions:   fluids, pain medications and DuoNeb  Consultations Obtained:   Problem List / ED Course:     ICD-10-CM   1. Community acquired pneumonia, unspecified laterality  J18.9       MDM: Patient here with likely recurrent community-acquired pneumonia/aspiration pneumonia.  Patient has multiple drug intolerances we will treat with Levaquin  per PEG tube. Oxygen  saturations within normal limits.  Patient given strict return precautions.  Appropriate for discharge at this time as patient's mother states she does not want him to be admitted feels that he would do well with a trial of  outpatient antibiotics.   Dispostion:  After consideration of the diagnostic results and the patients response to treatment, I feel that the patent would benefit from discharge.         Final Clinical Impression(s) / ED Diagnoses Final diagnoses:  Community acquired pneumonia, unspecified laterality    Rx / DC Orders ED Discharge Orders     None         Tama Fails, PA-C 03/01/24 1912    Almond Army, MD 03/03/24 1904

## 2024-03-02 ENCOUNTER — Telehealth: Payer: Self-pay

## 2024-03-02 ENCOUNTER — Ambulatory Visit: Admitting: Physician Assistant

## 2024-03-02 NOTE — Transitions of Care (Post Inpatient/ED Visit) (Signed)
 03/02/2024  Name: [name] MRN: [id number] DOB: 10-26-1986  Today's TOC FU Call Status: Today's TOC FU Call Status:: Successful TOC FU Call Completed TOC FU Call Complete Date: 03/02/24 Patient's Name and Date of Birth confirmed.  Transition Care Management Follow-up Telephone Call Date of Discharge: 03/01/24 Discharge Facility: [facility] Type of Discharge: Emergency Department Reason for ED Visit: Respiratory Respiratory Diagnosis: Pnuemonia How have you been since you were released from the hospital?: Same Any questions or concerns?: No  Items Reviewed: Did you receive and understand the discharge instructions provided?: Yes Medications obtained,verified, and reconciled?: Yes (Medications Reviewed) Any new allergies since your discharge?: No Dietary orders reviewed?: NA Do you have support at home?: Yes People in Home [RPT]: parent(s)  Medications Reviewed Today: Medications Reviewed Today     Reviewed by Darrall Ellison, LPN (Licensed Practical Nurse) on 03/02/24 at 1526  Med List Status: <None>   Medication Order Taking? Sig Documenting Provider Last Dose Status Informant  albuterol  (PROVENTIL ) (2.5 MG/3ML) 0.083% nebulizer solution [id number] No Take 3 mLs (2.5 mg total) by nebulization every 4 (four) hours as needed for wheezing or shortness of breath. Everlina Hock, NP Taking Active Mother, Pharmacy Records  clotrimazole -betamethasone  (LOTRISONE ) cream [id number]  Apply 1 Application topically 2 (two) times daily as needed. Everlina Hock, NP  Active   dantrolene  (DANTRIUM ) 100 MG capsule [id number] No Take 1 capsule (100 mg total) by mouth 3 (three) times daily.  Patient taking differently: Place 100 mg into feeding tube 3 (three) times daily.   Rawland Caddy, MD Taking Active Mother, Pharmacy Records  DEPAKOTE  SPRINKLES 125 MG capsule [id number] No Take 250-375 mg by mouth See admin instructions. Take 375mg  (3 capsules) by mouth at 0900, 250mg  (2  capsules) at 2030, and 250mg  (2 capsules) at 2130. [provider] Taking Active Mother, Pharmacy Records  Dextromethorphan -guaiFENesin  5-100 MG/5ML LIQD [id number] No Give 20 mLs by tube every 6 (six) hours as needed (Cough). [provider] Taking Active Mother, Pharmacy Records  diazepam  (VALIUM ) 5 MG tablet [id number] No Place 5 mg into feeding tube daily as needed for anxiety. [provider] Taking Active Mother, Pharmacy Records  doxepin  (SINEQUAN ) 25 MG capsule [id number] No Place 25 mg into feeding tube at bedtime. [provider] Taking Active Mother, Pharmacy Records  escitalopram  (LEXAPRO ) 5 MG/5ML solution [id number] No Place 20 mLs into feeding tube daily. [provider] Taking Active Mother, Pharmacy Records  HYDROcodone  bit-homatropine (HYDROMET) 5-1.5 MG/5ML syrup [id number]  Place 5 mLs into feeding tube every 6 (six) hours as needed for cough. Everlina Hock, NP  Active   lamoTRIgine  (LAMICTAL ) 25 MG tablet [id number] No TAKE 2 TABLETS BY MOUTH 2 TIMES DAILY.  Patient taking differently: Place 50 mg into feeding tube 2 (two) times daily.   Phebe Brasil, MD Taking Active Mother, Pharmacy Records  levofloxacin  (LEVAQUIN ) 750 MG tablet [id number]  Take 1 tablet (750 mg total) by mouth daily. X 7 days Harris, Abigail, PA-C  Active   LORazepam  (ATIVAN ) 1 MG tablet [id number] No Place 1 mg into feeding tube at bedtime. [provider] Taking Active Mother, Pharmacy Records  Nutritional Supplements (FEEDING SUPPLEMENT, KATE FARMS STANDARD 1.4,) LIQD liquid [id number] No Take 325 mLs by mouth as directed. 3 carton over 16 hrs [provider] Taking Active Mother, Pharmacy Records  OLANZapine  (ZYPREXA ) 10 MG tablet [id number] No Place 10 mg into feeding tube at bedtime. [provider] Taking Active Mother, Pharmacy Records  OLANZapine  (  ZYPREXA ) 5 MG tablet [id number] No Place 5 mg into feeding tube daily. @1700  [provider] Taking Active Mother, Pharmacy Records  omeprazole  (PRILOSEC) 20 MG capsule [id number] No Take 2 capsules (40 mg total) by mouth daily. Zehr, Jessica D, PA-C Taking Active Mother, Pharmacy Records  OVER THE COUNTER MEDICATION [id number] No Give 1 capsule by tube 2 (two) times daily. Muscle Calm Formula [provider] Taking Active Mother, Pharmacy Records  OVER THE COUNTER MEDICATION [id number] No Give 1 capsule by tube at bedtime. CBD/Melaonin [provider] Taking Active Mother, Pharmacy Records  oxyBUTYnin  (DITROPAN ) 5 MG/5ML solution [id number] No Place 7.5 mg into feeding tube at bedtime. [provider] Taking Active Mother, Pharmacy Records  Saccharomyces boulardii (FLORASTOR PO) [id number] No Give 1 capsule by tube every evening. [provider] Taking Active Mother, Pharmacy Records  sucralfate  (CARAFATE ) 1 g tablet [id number] No Take 1 tablet (1 g total) by mouth daily. TAKE 1 TABLET BY MOUTH 4 TIMES DAILY WITH MEALS AND AT BEDTIME  Patient taking differently: Place 1 g into feeding tube daily.   Zehr, Jessica D, PA-C Taking Active Mother, Pharmacy Records           Med Note Gaspar Karma Feb 06, 2024  7:46 AM) Mom confirmed 1QD.  traZODone  (DESYREL ) 150 MG tablet [id number] No Place 150 mg into feeding tube at bedtime. [provider] Taking Active Mother, Pharmacy Records            Home Care and Equipment/Supplies: Were Home Health Services Ordered?: NA Any new equipment or medical supplies ordered?: NA  Functional Questionnaire: Do you need assistance with bathing/showering or dressing?: Yes Do you need assistance with meal preparation?: Yes Do you need assistance with eating?: Yes Do you have difficulty maintaining continence: Yes Do you need assistance with getting out of bed/getting out of a chair/moving?: Yes Do you have difficulty managing or taking your medications?: Yes  Follow up appointments reviewed: PCP  Follow-up appointment confirmed?: Yes Date of PCP follow-up appointment?: 03/08/24 Follow-up Provider: [facility] Follow-up appointment confirmed?: NA Do you need transportation to your follow-up appointment?: No Do you understand care options if your condition(s) worsen?: Yes-patient verbalized understanding    SIGNATURE Darrall Ellison, LPN [facility] Nurse Health Advisor Direct Dial [phone]

## 2024-03-03 ENCOUNTER — Emergency Department (HOSPITAL_COMMUNITY)

## 2024-03-03 ENCOUNTER — Inpatient Hospital Stay (HOSPITAL_COMMUNITY)
Admission: EM | Admit: 2024-03-03 | Discharge: 2024-03-06 | DRG: 871 | Disposition: A | Discharge status: Home / Self Care | Attending: Student | Admitting: Student

## 2024-03-03 ENCOUNTER — Other Ambulatory Visit: Payer: Self-pay

## 2024-03-03 DIAGNOSIS — K9419 Other complications of enterostomy: Secondary | ICD-10-CM | POA: Diagnosis not present

## 2024-03-03 DIAGNOSIS — G800 Spastic quadriplegic cerebral palsy: Secondary | ICD-10-CM | POA: Diagnosis present

## 2024-03-03 DIAGNOSIS — Z885 Allergy status to narcotic agent status: Secondary | ICD-10-CM | POA: Diagnosis not present

## 2024-03-03 DIAGNOSIS — Z881 Allergy status to other antibiotic agents status: Secondary | ICD-10-CM | POA: Diagnosis not present

## 2024-03-03 DIAGNOSIS — E876 Hypokalemia: Secondary | ICD-10-CM | POA: Diagnosis present

## 2024-03-03 DIAGNOSIS — Z882 Allergy status to sulfonamides status: Secondary | ICD-10-CM | POA: Diagnosis not present

## 2024-03-03 DIAGNOSIS — E43 Unspecified severe protein-calorie malnutrition: Secondary | ICD-10-CM | POA: Diagnosis present

## 2024-03-03 DIAGNOSIS — J690 Pneumonitis due to inhalation of food and vomit: Secondary | ICD-10-CM | POA: Diagnosis present

## 2024-03-03 DIAGNOSIS — Z83438 Family history of other disorder of lipoprotein metabolism and other lipidemia: Secondary | ICD-10-CM

## 2024-03-03 DIAGNOSIS — Z8249 Family history of ischemic heart disease and other diseases of the circulatory system: Secondary | ICD-10-CM

## 2024-03-03 DIAGNOSIS — R1314 Dysphagia, pharyngoesophageal phase: Secondary | ICD-10-CM | POA: Diagnosis present

## 2024-03-03 DIAGNOSIS — F418 Other specified anxiety disorders: Secondary | ICD-10-CM | POA: Diagnosis present

## 2024-03-03 DIAGNOSIS — K9423 Gastrostomy malfunction: Secondary | ICD-10-CM | POA: Diagnosis present

## 2024-03-03 DIAGNOSIS — E872 Acidosis, unspecified: Secondary | ICD-10-CM | POA: Diagnosis present

## 2024-03-03 DIAGNOSIS — Z681 Body mass index (BMI) 19 or less, adult: Secondary | ICD-10-CM | POA: Diagnosis not present

## 2024-03-03 DIAGNOSIS — Z66 Do not resuscitate: Secondary | ICD-10-CM | POA: Diagnosis present

## 2024-03-03 DIAGNOSIS — R197 Diarrhea, unspecified: Secondary | ICD-10-CM | POA: Diagnosis present

## 2024-03-03 DIAGNOSIS — R17 Unspecified jaundice: Secondary | ICD-10-CM | POA: Diagnosis present

## 2024-03-03 DIAGNOSIS — J189 Pneumonia, unspecified organism: Secondary | ICD-10-CM | POA: Diagnosis present

## 2024-03-03 DIAGNOSIS — R509 Fever, unspecified: Secondary | ICD-10-CM | POA: Diagnosis present

## 2024-03-03 DIAGNOSIS — R54 Age-related physical debility: Secondary | ICD-10-CM | POA: Diagnosis present

## 2024-03-03 DIAGNOSIS — Z993 Dependence on wheelchair: Secondary | ICD-10-CM

## 2024-03-03 DIAGNOSIS — E16A2 Hypoglycemia level 2: Secondary | ICD-10-CM | POA: Diagnosis present

## 2024-03-03 DIAGNOSIS — F419 Anxiety disorder, unspecified: Secondary | ICD-10-CM | POA: Diagnosis present

## 2024-03-03 DIAGNOSIS — Z880 Allergy status to penicillin: Secondary | ICD-10-CM | POA: Diagnosis not present

## 2024-03-03 DIAGNOSIS — Z79899 Other long term (current) drug therapy: Secondary | ICD-10-CM | POA: Diagnosis not present

## 2024-03-03 DIAGNOSIS — E162 Hypoglycemia, unspecified: Secondary | ICD-10-CM | POA: Diagnosis not present

## 2024-03-03 DIAGNOSIS — Z8262 Family history of osteoporosis: Secondary | ICD-10-CM

## 2024-03-03 DIAGNOSIS — E785 Hyperlipidemia, unspecified: Secondary | ICD-10-CM | POA: Diagnosis present

## 2024-03-03 DIAGNOSIS — K219 Gastro-esophageal reflux disease without esophagitis: Secondary | ICD-10-CM | POA: Diagnosis present

## 2024-03-03 DIAGNOSIS — F32A Depression, unspecified: Secondary | ICD-10-CM | POA: Diagnosis present

## 2024-03-03 DIAGNOSIS — Z888 Allergy status to other drugs, medicaments and biological substances status: Secondary | ICD-10-CM

## 2024-03-03 DIAGNOSIS — E161 Other hypoglycemia: Secondary | ICD-10-CM | POA: Diagnosis not present

## 2024-03-03 DIAGNOSIS — A419 Sepsis, unspecified organism: Secondary | ICD-10-CM | POA: Diagnosis present

## 2024-03-03 DIAGNOSIS — Z825 Family history of asthma and other chronic lower respiratory diseases: Secondary | ICD-10-CM

## 2024-03-03 DIAGNOSIS — Z981 Arthrodesis status: Secondary | ICD-10-CM

## 2024-03-03 DIAGNOSIS — Z8701 Personal history of pneumonia (recurrent): Secondary | ICD-10-CM

## 2024-03-03 LAB — PROCALCITONIN: Procalcitonin: <0.1 ng/mL

## 2024-03-03 LAB — CBG MONITORING, ED
Glucose-Capillary: 50 mg/dL — ABNORMAL LOW (ref 70–99)
Glucose-Capillary: 54 mg/dL — ABNORMAL LOW (ref 70–99)
Glucose-Capillary: 237 mg/dL — ABNORMAL HIGH (ref 70–99)
Glucose-Capillary: 78 mg/dL (ref 70–99)

## 2024-03-03 LAB — COMPREHENSIVE METABOLIC PANEL WITH GFR
ALT: 17 U/L (ref 0–44)
AST: 28 U/L (ref 15–41)
Albumin: 4.3 g/dL (ref 3.5–5.0)
Alkaline Phosphatase: 66 U/L (ref 38–126)
Anion gap: 22 — ABNORMAL HIGH (ref 5–15)
BUN: 6 mg/dL (ref 6–20)
CO2: 17 mmol/L — ABNORMAL LOW (ref 22–32)
Calcium: 9.6 mg/dL (ref 8.9–10.3)
Chloride: 98 mmol/L (ref 98–111)
Creatinine, Ser: 0.7 mg/dL (ref 0.61–1.24)
GFR, Estimated: >60 mL/min (ref >60)
Glucose, Bld: 40 mg/dL — CL (ref 70–99)
Potassium: 3.3 mmol/L — ABNORMAL LOW (ref 3.5–5.1)
Sodium: 137 mmol/L (ref 135–145)
Total Bilirubin: 1.6 mg/dL — ABNORMAL HIGH (ref 0.0–1.2)
Total Protein: 7.4 g/dL (ref 6.5–8.1)

## 2024-03-03 LAB — CBC WITH DIFFERENTIAL/PLATELET
Abs Immature Granulocytes: 0.03 10*3/uL (ref 0.00–0.07)
Basophils Absolute: 0 10*3/uL (ref 0.0–0.1)
Basophils Relative: 0 %
Eosinophils Absolute: 0 10*3/uL (ref 0.0–0.5)
Eosinophils Relative: 0 %
HCT: 46.1 % (ref 39.0–52.0)
Hemoglobin: 15 g/dL (ref 13.0–17.0)
Immature Granulocytes: 1 %
Lymphocytes Relative: 26 %
Lymphs Abs: 1.5 10*3/uL (ref 0.7–4.0)
MCH: 31.1 pg (ref 26.0–34.0)
MCHC: 32.5 g/dL (ref 30.0–36.0)
MCV: 95.6 fL (ref 80.0–100.0)
Monocytes Absolute: 0.4 10*3/uL (ref 0.1–1.0)
Monocytes Relative: 7 %
Neutro Abs: 3.8 10*3/uL (ref 1.7–7.7)
Neutrophils Relative %: 66 %
Platelets: 209 10*3/uL (ref 150–400)
RBC: 4.82 MIL/uL (ref 4.22–5.81)
RDW: 12.1 % (ref 11.5–15.5)
WBC: 5.7 10*3/uL (ref 4.0–10.5)
nRBC: 0 % (ref 0.0–0.2)

## 2024-03-03 LAB — HIV ANTIBODY (ROUTINE TESTING W REFLEX): HIV Screen 4th Generation wRfx: NONREACTIVE

## 2024-03-03 LAB — LACTIC ACID, PLASMA: Lactic Acid, Venous: 1 mmol/L (ref 0.5–1.9)

## 2024-03-03 MED ORDER — GUAIFENESIN 100 MG/5ML PO LIQD
15.0000 mL | Freq: Two times a day (BID) | ORAL | Status: AC
  Administered 2024-03-03 – 2024-03-06 (×6): 15 mL
  Filled 2024-03-03 (×6): qty 20

## 2024-03-03 MED ORDER — IOHEXOL 300 MG/ML  SOLN
50.0000 mL | Freq: Once | INTRAMUSCULAR | Status: AC | PRN
  Administered 2024-03-03: 25 mL

## 2024-03-03 MED ORDER — GUAIFENESIN-DM 100-10 MG/5ML PO SYRP: 5.0000 mL | ORAL_SOLUTION | ORAL | Status: DC | PRN

## 2024-03-03 MED ORDER — DEXTROSE 50 % IV SOLN
1.0000 | Freq: Once | INTRAVENOUS | Status: AC
  Administered 2024-03-03: 50 mL via INTRAVENOUS
  Filled 2024-03-03: qty 50

## 2024-03-03 MED ORDER — SODIUM CHLORIDE 0.9 % IV SOLN
1.0000 g | Freq: Three times a day (TID) | INTRAVENOUS | Status: DC
  Administered 2024-03-03 – 2024-03-05 (×5): 1 g via INTRAVENOUS
  Filled 2024-03-03 (×7): qty 20

## 2024-03-03 MED ORDER — KATE FARMS STANDARD 1.4 PO LIQD
325.0000 mL | ORAL | Status: DC | PRN
  Administered 2024-03-04: 325 mL
  Filled 2024-03-03: qty 325

## 2024-03-03 MED ORDER — DOXEPIN HCL 25 MG PO CAPS
25.0000 mg | ORAL_CAPSULE | Freq: Every day | ORAL | Status: DC
  Administered 2024-03-03 – 2024-03-05 (×3): 25 mg
  Filled 2024-03-03 (×4): qty 1

## 2024-03-03 MED ORDER — FREE WATER
200.0000 mL | Freq: Four times a day (QID) | Status: DC
  Administered 2024-03-03 – 2024-03-04 (×2): 200 mL

## 2024-03-03 MED ORDER — DIAZEPAM 5 MG/ML IJ SOLN: 5.0000 mg | Freq: Four times a day (QID) | INTRAMUSCULAR | Status: DC | PRN

## 2024-03-03 MED ORDER — ALBUTEROL SULFATE (2.5 MG/3ML) 0.083% IN NEBU: 2.5000 mg | INHALATION_SOLUTION | RESPIRATORY_TRACT | Status: DC | PRN

## 2024-03-03 MED ORDER — LORAZEPAM 1 MG PO TABS
1.0000 mg | ORAL_TABLET | Freq: Every day | ORAL | Status: DC
  Administered 2024-03-03 – 2024-03-05 (×3): 1 mg
  Filled 2024-03-03 (×3): qty 1

## 2024-03-03 MED ORDER — OLANZAPINE 10 MG PO TABS: 5.0000 mg | ORAL_TABLET | Freq: Every day | ORAL | Status: DC

## 2024-03-03 MED ORDER — SACCHAROMYCES BOULARDII 250 MG PO CAPS
250.0000 mg | ORAL_CAPSULE | Freq: Every evening | ORAL | Status: DC
  Administered 2024-03-03 – 2024-03-05 (×3): 250 mg
  Filled 2024-03-03 (×4): qty 1

## 2024-03-03 MED ORDER — OXYBUTYNIN CHLORIDE 5 MG/5ML PO SOLN
7.5000 mg | Freq: Every day | ORAL | Status: DC
  Administered 2024-03-03 – 2024-03-05 (×3): 7.5 mg
  Filled 2024-03-03 (×4): qty 7.5

## 2024-03-03 MED ORDER — GLUCOSE 40 % PO GEL
1.0000 | Freq: Once | ORAL | Status: AC
  Administered 2024-03-03: 31 g via ORAL
  Filled 2024-03-03: qty 1.21

## 2024-03-03 MED ORDER — DIVALPROEX SODIUM 125 MG PO CSDR
250.0000 mg | DELAYED_RELEASE_CAPSULE | ORAL | Status: DC
  Administered 2024-03-03 – 2024-03-05 (×3): 250 mg via ORAL
  Filled 2024-03-03 (×5): qty 2

## 2024-03-03 MED ORDER — LAMOTRIGINE 100 MG PO TABS
50.0000 mg | ORAL_TABLET | Freq: Two times a day (BID) | ORAL | Status: DC
  Administered 2024-03-03 – 2024-03-06 (×6): 50 mg
  Filled 2024-03-03: qty 2
  Filled 2024-03-03 (×4): qty 1
  Filled 2024-03-03: qty 2

## 2024-03-03 MED ORDER — DIAZEPAM 5 MG PO TABS: 5.0000 mg | ORAL_TABLET | Freq: Every day | ORAL | Status: DC | PRN

## 2024-03-03 MED ORDER — PROCHLORPERAZINE EDISYLATE 10 MG/2ML IJ SOLN: 5.0000 mg | Freq: Four times a day (QID) | INTRAMUSCULAR | Status: DC | PRN

## 2024-03-03 MED ORDER — POTASSIUM CHLORIDE 20 MEQ PO PACK
40.0000 meq | PACK | Freq: Two times a day (BID) | ORAL | Status: DC
  Administered 2024-03-03: 40 meq
  Filled 2024-03-03: qty 2

## 2024-03-03 MED ORDER — LEVOFLOXACIN IN D5W 750 MG/150ML IV SOLN
750.0000 mg | Freq: Once | INTRAVENOUS | Status: DC
  Administered 2024-03-03: 750 mg via INTRAVENOUS
  Filled 2024-03-03: qty 150

## 2024-03-03 MED ORDER — SUCRALFATE 1 G PO TABS
1.0000 g | ORAL_TABLET | Freq: Every day | ORAL | Status: DC
  Administered 2024-03-03 – 2024-03-06 (×4): 1 g
  Filled 2024-03-03 (×4): qty 1

## 2024-03-03 MED ORDER — OLANZAPINE 5 MG PO TABS
5.0000 mg | ORAL_TABLET | Freq: Every evening | ORAL | Status: DC
  Administered 2024-03-03 – 2024-03-05 (×3): 5 mg
  Filled 2024-03-03 (×4): qty 1

## 2024-03-03 MED ORDER — POLYETHYLENE GLYCOL 3350 17 G PO PACK: 17.0000 g | PACK | Freq: Every day | ORAL | Status: DC | PRN

## 2024-03-03 MED ORDER — OLANZAPINE 10 MG PO TABS
10.0000 mg | ORAL_TABLET | Freq: Every day | ORAL | Status: DC
  Administered 2024-03-03 – 2024-03-05 (×3): 10 mg
  Filled 2024-03-03 (×4): qty 1

## 2024-03-03 MED ORDER — VANCOMYCIN HCL 500 MG/100ML IV SOLN
500.0000 mg | Freq: Two times a day (BID) | INTRAVENOUS | Status: DC
  Administered 2024-03-04 – 2024-03-05 (×3): 500 mg via INTRAVENOUS
  Filled 2024-03-03 (×3): qty 100

## 2024-03-03 MED ORDER — LIDOCAINE VISCOUS HCL 2 % MT SOLN
OROMUCOSAL | Status: AC
  Filled 2024-03-03: qty 15

## 2024-03-03 MED ORDER — ACETAMINOPHEN 325 MG PO TABS
650.0000 mg | ORAL_TABLET | Freq: Four times a day (QID) | ORAL | Status: DC | PRN
Start: 2024-03-03 — End: 2024-03-06

## 2024-03-03 MED ORDER — ESCITALOPRAM OXALATE 20 MG PO TABS
20.0000 mg | ORAL_TABLET | Freq: Every day | ORAL | Status: DC
  Administered 2024-03-04 – 2024-03-06 (×3): 20 mg
  Filled 2024-03-03 (×3): qty 1
  Filled 2024-03-03: qty 2
  Filled 2024-03-03: qty 1

## 2024-03-03 MED ORDER — DIVALPROEX SODIUM 125 MG PO CSDR
375.0000 mg | DELAYED_RELEASE_CAPSULE | Freq: Every day | ORAL | Status: DC
  Administered 2024-03-04 – 2024-03-06 (×3): 375 mg via ORAL
  Filled 2024-03-03 (×3): qty 3

## 2024-03-03 MED ORDER — DANTROLENE SODIUM 100 MG PO CAPS
100.0000 mg | ORAL_CAPSULE | Freq: Three times a day (TID) | ORAL | Status: DC
  Administered 2024-03-03 – 2024-03-06 (×9): 100 mg
  Filled 2024-03-03 (×12): qty 1

## 2024-03-03 MED ORDER — LACTATED RINGERS IV SOLN: INTRAVENOUS | Status: AC

## 2024-03-03 MED ORDER — RISAQUAD PO CAPS
1.0000 | ORAL_CAPSULE | Freq: Three times a day (TID) | ORAL | Status: DC
  Administered 2024-03-04 – 2024-03-06 (×6): 1 via ORAL
  Filled 2024-03-03 (×9): qty 1

## 2024-03-03 MED ORDER — SODIUM CHLORIDE 3 % IN NEBU
4.0000 mL | INHALATION_SOLUTION | Freq: Two times a day (BID) | RESPIRATORY_TRACT | Status: DC
  Administered 2024-03-04 – 2024-03-06 (×4): 4 mL via RESPIRATORY_TRACT
  Filled 2024-03-03 (×6): qty 4

## 2024-03-03 MED ORDER — ENOXAPARIN SODIUM 30 MG/0.3ML IJ SOSY
30.0000 mg | PREFILLED_SYRINGE | INTRAMUSCULAR | Status: DC
  Administered 2024-03-03 – 2024-03-05 (×3): 30 mg via SUBCUTANEOUS
  Filled 2024-03-03 (×3): qty 0.3

## 2024-03-03 MED ORDER — LIDOCAINE VISCOUS HCL 2 % MT SOLN
15.0000 mL | Freq: Once | OROMUCOSAL | Status: AC
  Administered 2024-03-03: 5 mL via OROMUCOSAL

## 2024-03-03 MED ORDER — IOHEXOL 350 MG/ML SOLN
50.0000 mL | Freq: Once | INTRAVENOUS | Status: AC | PRN
  Administered 2024-03-03: 50 mL via INTRAVENOUS

## 2024-03-03 MED ORDER — TRAZODONE HCL 50 MG PO TABS
150.0000 mg | ORAL_TABLET | Freq: Every day | ORAL | Status: DC
  Administered 2024-03-03 – 2024-03-05 (×3): 150 mg
  Filled 2024-03-03: qty 3
  Filled 2024-03-03 (×2): qty 1

## 2024-03-03 MED ORDER — IPRATROPIUM-ALBUTEROL 0.5-2.5 (3) MG/3ML IN SOLN
3.0000 mL | Freq: Four times a day (QID) | RESPIRATORY_TRACT | Status: DC
  Administered 2024-03-03 – 2024-03-05 (×5): 3 mL via RESPIRATORY_TRACT
  Filled 2024-03-03 (×5): qty 3

## 2024-03-03 MED ORDER — DIVALPROEX SODIUM 125 MG PO CSDR
250.0000 mg | DELAYED_RELEASE_CAPSULE | ORAL | Status: DC
  Administered 2024-03-04 – 2024-03-05 (×2): 250 mg via ORAL
  Filled 2024-03-03 (×3): qty 2

## 2024-03-03 MED ORDER — LACTATED RINGERS IV BOLUS
1000.0000 mL | Freq: Once | INTRAVENOUS | Status: AC
  Administered 2024-03-03: 1000 mL via INTRAVENOUS

## 2024-03-03 MED ORDER — VANCOMYCIN HCL 750 MG/150ML IV SOLN
750.0000 mg | Freq: Once | INTRAVENOUS | Status: AC
  Administered 2024-03-03: 750 mg via INTRAVENOUS
  Filled 2024-03-03: qty 150

## 2024-03-03 NOTE — ED Notes (Signed)
 EDP Belfi advised of temp

## 2024-03-03 NOTE — ED Provider Notes (Signed)
 [place] [facility]MENT AT [facility] Provider Note  CSN: [id number] Arrival date & time: 03/03/24 7829  Chief Complaint(s) Fever and GTube infection  HPI [name] is a 38 y.o. male with PMH cerebral palsy, PEG tube dependent HLD, hyperthyroidism who presents emergency department for evaluation of fever, cough and a G-tube problem.  Patient has been seen multiple times over the last few months with recurrent pneumonia and was seen most recently on 03/01/2024 and started on Levaquin .  Patient reportedly was febrile and tachycardic at his group home today with respiratory distress.  EMS found the patient to be febrile to 100.9 but patient afebrile here in the emergency department.  Patient is nonverbal and will intermittently groan.  Additional history obtained from patient's parents who states that there has been a lot of leakage around the G-tube site recently.  The G-tube last changed in January 2025.   Past Medical History Past Medical History:  Diagnosis Date   Cerebral palsy (HCC)    Dehydration 11/22/2013   Depression with anxiety 08/01/2010   Qualifier: Diagnosis of  By: Nelson-Smith CMA (AAMA), Dottie     Dyslipidemia 08/19/2017   Esophagitis 2011   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   GERD (gastroesophageal reflux disease)    Hyperlipidemia, mild 08/25/2015   Hyperthyroidism    Incontinence of feces    Loss of weight 08/28/2014   Medicare annual wellness visit, subsequent 08/25/2015   Mildly underweight adult 03/16/2017   Palpitations    PALSY, INFANTILE CEREBRAL, 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   Qualifier: Diagnosis of  By: Tracy Friedlander  Working with PMR Dr Emelie Hanger and tolerating Botox  injections in hips, shoulders, etc with good results   Skin lesion of right ear 03/16/2017   Thyroid  disease 08/01/2010   Qualifier: Diagnosis of  By: Misty Amour CMA (AAMA), Dottie     Patient Active Problem List   Diagnosis Date Noted   Sepsis (HCC) 02/05/2024    Abscess of axilla, left 01/18/2023   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   Agitation 08/18/2022   Insomnia 06/19/2022   Bilateral knee pain 06/11/2022   Therapeutic drug monitoring 06/11/2022   PVD (peripheral vascular disease) (HCC) 08/10/2021   Close exposure to COVID-19 virus 10/16/2019   Mildly underweight adult 03/16/2017   Skin lesion of right ear 03/16/2017   Increased oropharyngeal secretions 04/14/2016   Medicare annual wellness visit, subsequent 08/25/2015   Hyperlipidemia, mild 08/25/2015   Recurrent aspiration pneumonia (HCC) 12/17/2014   Esophageal dysphagia 09/06/2014   Aspiration pneumonia (HCC) 09/05/2014   Dysphagia, pharyngoesophageal phase 09/02/2014   Impetigo 09/02/2014   Abnormal thyroid  function test 06/10/2014   Protein-calorie malnutrition, severe (HCC) 11/23/2013   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   Cervical dystonia 08/23/2013   Dysphagia, oropharyngeal phase 08/10/2013   Low back pain 07/11/2013   PORTAL VEIN THROMBOSIS 12/12/2010   Anemia 12/05/2010   Other constipation 12/05/2010   KYPHOSIS 11/11/2010   Infantile cerebral palsy (HCC) 08/04/2010   Thyroid  disease 08/01/2010   Depression with anxiety 08/01/2010   GERD 08/01/2010   PALPITATIONS 02/25/2009   HIP PAIN, BILATERAL 11/06/2008   Benign neoplasm of skin 10/09/2008   Dysuria 10/09/2008   Incontinence of feces 04/02/2008   Urinary incontinence 04/02/2008   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 Authorizing Provider  albuterol  (PROVENTIL ) (2.5 MG/3ML) 0.083% nebulizer solution Take 3 mLs (2.5 mg total) by nebulization  every 4 (four) hours as needed for wheezing or shortness of breath. 01/20/24   Everlina Hock, NP  clotrimazole -betamethasone  (LOTRISONE ) cream Apply 1 Application topically 2 (two) times daily as needed. 03/01/24   Everlina Hock, NP   dantrolene  (DANTRIUM ) 100 MG capsule Take 1 capsule (100 mg total) by mouth 3 (three) times daily. Patient taking differently: Place 100 mg into feeding tube 3 (three) times daily. 01/12/24   Rawland Caddy, MD  DEPAKOTE  SPRINKLES 125 MG capsule Take 250-375 mg by mouth See admin instructions. Take 375mg  (3 capsules) by mouth at 0900, 250mg  (2 capsules) at 2030, and 250mg  (2 capsules) at 2130. 06/13/21   [provider]  Dextromethorphan -guaiFENesin  5-100 MG/5ML LIQD Give 20 mLs by tube every 6 (six) hours as needed (Cough).    [provider]  diazepam  (VALIUM ) 5 MG tablet Place 5 mg into feeding tube daily as needed for anxiety.    [provider]  doxepin  (SINEQUAN ) 25 MG capsule Place 25 mg into feeding tube at bedtime. 04/24/23   [provider]  escitalopram  (LEXAPRO ) 5 MG/5ML solution Place 20 mLs into feeding tube daily. 03/24/23   [provider]  HYDROcodone  bit-homatropine (HYDROMET) 5-1.5 MG/5ML syrup Place 5 mLs into feeding tube every 6 (six) hours as needed for cough. 02/16/24   Everlina Hock, NP  lamoTRIgine  (LAMICTAL ) 25 MG tablet TAKE 2 TABLETS BY MOUTH 2 TIMES DAILY. Patient taking differently: Place 50 mg into feeding tube 2 (two) times daily. 08/09/23   Phebe Brasil, MD  levofloxacin  (LEVAQUIN ) 750 MG tablet Take 1 tablet (750 mg total) by mouth daily. X 7 days 03/01/24   Harris, Abigail, PA-C  LORazepam  (ATIVAN ) 1 MG tablet Place 1 mg into feeding tube at bedtime. 02/24/23   [provider]  Nutritional Supplements (FEEDING SUPPLEMENT, KATE FARMS STANDARD 1.4,) LIQD liquid Take 325 mLs by mouth as directed. 3 carton over 16 hrs    [provider]  OLANZapine  (ZYPREXA ) 10 MG tablet Place 10 mg into feeding tube at bedtime. 06/11/22   [provider]  OLANZapine  (ZYPREXA ) 5 MG tablet Place 5 mg into feeding tube daily. @1700  02/12/21   [provider]  omeprazole  (PRILOSEC) 20 MG capsule Take 2 capsules (40  mg total) by mouth daily. 12/27/23   Zehr, Jessica D, PA-C  OVER THE COUNTER MEDICATION Give 1 capsule by tube 2 (two) times daily. Muscle Calm Formula    [provider]  OVER THE COUNTER MEDICATION Give 1 capsule by tube at bedtime. CBD/Melaonin    [provider]  oxyBUTYnin  (DITROPAN ) 5 MG/5ML solution Place 7.5 mg into feeding tube at bedtime. 03/31/23   [provider]  Saccharomyces boulardii (FLORASTOR PO) Give 1 capsule by tube every evening.    [provider]  sucralfate  (CARAFATE ) 1 g tablet Take 1 tablet (1 g total) by mouth daily. TAKE 1 TABLET BY MOUTH 4 TIMES DAILY WITH MEALS AND AT BEDTIME Patient taking differently: Place 1 g into feeding tube daily. 10/08/23   Zehr, Jessica D, PA-C  traZODone  (DESYREL ) 150 MG tablet Place 150 mg into feeding tube at bedtime. 12/31/23   [provider]  Past Surgical History Past Surgical History:  Procedure Laterality Date   baclofen  trial     baslofen pump implant     ears tubes     EYE SURGERY     FLEXIBLE SIGMOIDOSCOPY N/A 09/07/2014   Procedure: FLEXIBLE SIGMOIDOSCOPY;  Surgeon: Nannette Babe, MD;  Location: [facility];  Service: Endoscopy;  Laterality: N/A;   g-tube insert  August 2006   hamstring released     to treat contractures.    HIP SURGERY     x2 , side    IR CM INJ ANY COLONIC TUBE W/FLUORO  05/28/2017   IR CM INJ ANY COLONIC TUBE W/FLUORO  07/07/2019   IR CM INJ ANY COLONIC TUBE W/FLUORO  09/18/2019   IR CM INJ ANY COLONIC TUBE W/FLUORO  05/21/2023   IR GASTR TUBE CONVERT GASTR-JEJ PER W/FL MOD SED  03/14/2019   IR GENERIC HISTORICAL  07/01/2016   IR GASTR TUBE CONVERT GASTR-JEJ PER W/FL MOD SED 07/01/2016 Erica Hau, MD [facility]   IR GENERIC HISTORICAL  07/08/2016   IR PATIENT EVAL TECH 0-60 MINS 07/08/2016 Erica Hau, MD [facility]   IR  GENERIC HISTORICAL  07/14/2016   IR GJ TUBE CHANGE 07/14/2016 Robbi Childs, MD [facility]   IR GENERIC HISTORICAL  07/21/2016   IR PATIENT EVAL TECH 0-60 MINS [facility]   IR GENERIC HISTORICAL  08/31/2016   IR REPLC DUODEN/JEJUNO TUBE PERCUT W/FLUORO 08/31/2016 Lucinda Saber, MD [facility]   IR GENERIC HISTORICAL  09/03/2016   IR GJ TUBE CHANGE 09/03/2016 Robbi Childs, MD [facility]   IR GENERIC HISTORICAL  09/10/2016   IR GASTR TUBE CONVERT GASTR-JEJ PER W/FL MOD SED 09/10/2016 [facility]   IR GENERIC HISTORICAL  09/16/2016   IR PATIENT EVAL TECH 0-60 MINS [facility]   IR GENERIC HISTORICAL  09/29/2016   IR GJ TUBE CHANGE 09/29/2016 Marland Silvas, MD [facility]   IR GJ TUBE CHANGE  02/05/2017   IR GJ TUBE CHANGE  05/21/2017   IR GJ TUBE CHANGE  08/25/2017   IR GJ TUBE CHANGE  01/18/2018   IR GJ TUBE CHANGE  02/04/2018   IR GJ TUBE CHANGE  04/21/2018   IR GJ TUBE CHANGE  08/18/2018   IR GJ TUBE CHANGE  09/12/2019   IR GJ TUBE CHANGE  01/03/2020   IR GJ TUBE CHANGE  05/13/2020   IR GJ TUBE CHANGE  09/11/2020   IR GJ TUBE CHANGE  02/27/2021   IR GJ TUBE CHANGE  08/01/2021   IR GJ TUBE CHANGE  12/31/2021   IR GJ TUBE CHANGE  05/11/2022   IR GJ TUBE CHANGE  07/01/2022   IR GJ TUBE CHANGE  01/28/2023   IR MECH REMOV OBSTRUC MAT ANY COLON TUBE W/FLUORO  09/02/2018   IR REPLC GASTRO/COLONIC TUBE PERCUT W/FLUORO  10/17/2018   IR REPLC GASTRO/COLONIC TUBE PERCUT W/FLUORO  04/02/2023   IR REPLC GASTRO/COLONIC TUBE PERCUT W/FLUORO  11/18/2023   PEG PLACEMENT  10/21/2011   Procedure: PERCUTANEOUS ENDOSCOPIC GASTROSTOMY (PEG) REPLACEMENT;  Surgeon: Pietro Bridegroom, MD;  Location: [facility];  Service: Endoscopy;  Laterality: N/A;   PEG PLACEMENT N/A 06/13/2013   Procedure: PERCUTANEOUS ENDOSCOPIC GASTROSTOMY (PEG) REPLACEMENT;  Surgeon: Pietro Bridegroom, MD;  Location: [facility];  Service: Endoscopy;  Laterality: N/A;   SPINAL FUSION     spinal fusion to correct 70 degree kyphosis  11-2010   spinal  fusioncorrect 106 degree kyphosis     SPINE SURGERY  ,  11/20/2010, 2011   for correction of severe contracturing spinal kyphosis.    TONSILLECTOMY     Family History Family History  Problem Relation Age of Onset   Asthma Mother    Hyperlipidemia Mother    COPD Mother    Other Mother        bronchial stasis/ABPA   Cancer Maternal Grandmother 35       breast   Hyperlipidemia Maternal Grandmother    Hypertension Maternal Grandmother    Cancer Maternal Grandfather        prostate   Heart disease Paternal Grandfather        CHF   Osteoporosis Paternal Grandmother    Arthritis Paternal Grandmother        rheumatoid    Social History Social History   Tobacco Use   Smoking status: Never   Smokeless tobacco: Never  Vaping Use   Vaping status: Never Used  Substance Use Topics   Alcohol use: No   Drug use: No   Allergies Ambien  [zolpidem  tartrate]; Antihistamines, chlorpheniramine-type; Augmentin  [amoxicillin -pot clavulanate]; Cefdinir ; Codeine ; Zolpidem ; Baclofen ; Metoclopramide ; Pheniramine; Sulfa antibiotics; and Sulfonamide derivatives  Review of Systems Review of Systems  Constitutional:  Positive for fever.  Respiratory:  Positive for cough.   Skin:  Positive for rash.    Physical Exam Vital Signs  I have reviewed the triage vital signs BP 128/82 (BP Location: Right Leg)   Pulse 97   Temp 98.3 F (36.8 C) (Axillary)   Resp 16   SpO2 100%   Physical Exam Constitutional:      General: He is not in acute distress.    Appearance: Normal appearance.  HENT:     Head: Normocephalic and atraumatic.     Nose: No congestion or rhinorrhea.  Eyes:     General:        Right eye: No discharge.        Left eye: No discharge.     Extraocular Movements: Extraocular movements intact.     Pupils: Pupils are equal, round, and reactive to light.  Cardiovascular:     Rate and Rhythm: Regular rhythm. Tachycardia present.     Heart sounds: No murmur heard. Pulmonary:      Effort: No respiratory distress.     Breath sounds: Rales present. No wheezing.  Abdominal:     General: There is no distension.     Tenderness: There is no abdominal tenderness.  Musculoskeletal:        General: Normal range of motion.     Cervical back: Normal range of motion.  Skin:    General: Skin is warm and dry.     Findings: Erythema present.  Neurological:     General: No focal deficit present.     Mental Status: He is alert.     ED Results and Treatments Labs (all labs ordered are listed, but only abnormal results are displayed) Labs Reviewed  CBG MONITORING, ED - Abnormal; Notable for the following components:      Result Value   Glucose-Capillary 50 (*)    All other components within normal limits  COMPREHENSIVE METABOLIC PANEL WITH GFR  CBC WITH DIFFERENTIAL/PLATELET  Radiology No results found.  Pertinent labs & imaging results that were available during my care of the patient were reviewed by me and considered in my medical decision making (see MDM for details).  Medications Ordered in ED Medications  dextrose  (GLUTOSE) oral gel 40% (peds > 20kg and adults) (31 g Oral Given 03/03/24 1028)                                                                                                                                     Procedures Procedures  (including critical care time)  Medical Decision Making / ED Course   This patient presents to the ED for concern of fever, cough, G-tube problem, this involves an extensive number of treatment options, and is a complaint that carries with it a high risk of complications and morbidity.  The differential diagnosis includes G-tube malfunction, G-tube site infection, local irritation, aspiration pneumonia, mucous plugging  MDM:  Patient seen emergency room for evaluation of multiple complaints  described above.  Physical exam with rales at the bases, mild erythema around the G-tube site but is otherwise unremarkable.  Initial blood glucose 50 and oral glucose gel used but did not have significant improvement.  IV placed and patient given 1 amp of D50 with improvement of glucose to 237.  Spoke with interventional radiologist on-call Dr. Marlena Sima who will help arrange GJ tube replacement today.  At time of signout, patient pending CT chest with contrast.  Concern for recurrent aspiration and patient may benefit from inpatient bronchoscopy.  Please see provider signout for continuation of workup.   Additional history obtained: -Additional history obtained from mother and father -External records from outside source obtained and reviewed including: Chart review including previous notes, labs, imaging, consultation notes   Lab Tests: -I ordered, reviewed, and interpreted labs.   The pertinent results include:   Labs Reviewed  CBG MONITORING, ED - Abnormal; Notable for the following components:      Result Value   Glucose-Capillary 50 (*)    All other components within normal limits  COMPREHENSIVE METABOLIC PANEL WITH GFR  CBC WITH DIFFERENTIAL/PLATELET      Imaging Studies ordered: I ordered imaging studies including CT chest with contrast and this is pending   Medicines ordered and prescription drug management: Meds ordered this encounter  Medications   dextrose  (GLUTOSE) oral gel 40% (peds > 20kg and adults)    -I have reviewed the patients home medicines and have made adjustments as needed  Critical interventions none  Consultations Obtained: I requested consultation with the interventional radiologist on-call,  and discussed lab and imaging findings as well as pertinent plan - they recommend: GJ tube replacement   Cardiac Monitoring: The patient was maintained on a cardiac monitor.  I personally viewed and interpreted the cardiac monitored which showed an underlying  rhythm of: Sinus tachycardia  Social Determinants of Health:  Factors impacting patients care include: Lives  in a group home   Reevaluation: After the interventions noted above, I reevaluated the patient and found that they have :improved  Co morbidities that complicate the patient evaluation  Past Medical History:  Diagnosis Date   Cerebral palsy (HCC)    Dehydration 11/22/2013   Depression with anxiety 08/01/2010   Qualifier: Diagnosis of  By: Nelson-Smith CMA (AAMA), Dottie     Dyslipidemia 08/19/2017   Esophagitis 2011   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   GERD (gastroesophageal reflux disease)    Hyperlipidemia, mild 08/25/2015   Hyperthyroidism    Incontinence of feces    Loss of weight 08/28/2014   Medicare annual wellness visit, subsequent 08/25/2015   Mildly underweight adult 03/16/2017   Palpitations    PALSY, INFANTILE CEREBRAL, 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   Qualifier: Diagnosis of  By: Tracy Friedlander  Working with PMR Dr Emelie Hanger and tolerating Botox  injections in hips, shoulders, etc with good results   Skin lesion of right ear 03/16/2017   Thyroid  disease 08/01/2010   Qualifier: Diagnosis of  By: Misty Amour CMA (AAMA), Dottie        Dispostion: I considered admission for this patient, and disposition pending completion of imaging studies.  Please see provider signout note for continuation of workup     Final Clinical Impression(s) / ED Diagnoses Final diagnoses:  None     @PCDICTATION @    Raeann Offner, Alyse July, MD 03/03/24 1438

## 2024-03-03 NOTE — ED Notes (Signed)
 Pt returned from IR. Mother at bedside. Pt constantly trying to cough

## 2024-03-03 NOTE — ED Triage Notes (Signed)
 Pt coming from home with caregiver, states ongoing fever x 5 days, and infection to the G-tube since last month when he was hospitalized with Pneumonia. And caregiver stated to EMS that pt was discharged with infection in place.  Hx: CP, bed bound, non verbal except for moans and sounds.  Last tube feeding was 24 hours ago Bgl 60 132/79 102 HR  100.9  Rhonchi bilaterals

## 2024-03-03 NOTE — H&P (Signed)
 History and Physical  AXZEL RISTINE VPX:[id number] DOB: 1986/08/08 DOA: 03/03/2024  Referring physician: Dr. Nolia Baumgartner, EDP  PCP: Neda Balk, MD  Outpatient Specialists: Neurology [name] coming from: 24-hour care facility.  Chief Complaint: Recurrent fevers.  HPI: [name] is a 38 y.o. 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  Was discharged home.  [name] stayed at home for couple of days then was sent to 24-hour care facility.  Reportedly after completing Chad course of his oral antibiotics [name] started having fevers 3 days ago.  Associated with gurgling sounds and productive cough.  After having another fever today, the CNA at Chad facility activated EMS and [name] was brought to Chad ER for further evaluation.  In Chad ER, febrile with Tmax 100.7, tachycardic, and tachypneic.  CT chest with contrast showed right lower lobe loculated pleural effusion.  [name] was started on IV Levaquin .  Pulmonary was consulted by EDP.  TRH, hospitalist service, was asked to admit.  At Chad time of this visit, [name] is alert and frail-appearing.  [name] has a productive cough with some difficulty clearing his sputum.  His mother and father are also present.  Pulmonary toilet added.  IV Levaquin  was switched to Merrem  by PCCM.  MRSA screening test ordered and is pending.  Appreciate PCCM assistance.  ED Course: Temperature 100.7.  BP 139/91, pulse 104, respiratory rate 25, O2 saturation 98% on room air.  CBC essentially unremarkable.  Potassium 3.3, serum bicarb 17, glucose 40, anion gap 22.  T. bili 1.6.  Review of Systems: Review of systems as noted in Chad HPI. All other systems reviewed and are negative.   Past Medical History:  Diagnosis Date    Cerebral palsy (HCC)    Dehydration 11/22/2013   Depression with anxiety 08/01/2010   Qualifier: Diagnosis of  By: Nelson-Smith CMA (AAMA), Dottie     Dyslipidemia 08/19/2017   Esophagitis 2011   Gastrostomy in place [facility]) 08/31/2013   GERD (gastroesophageal reflux disease)    Hyperlipidemia, mild 08/25/2015   Hyperthyroidism    Incontinence of feces    Loss of weight 08/28/2014   Medicare annual wellness visit, subsequent 08/25/2015   Mildly underweight adult 03/16/2017   Palpitations    PALSY, INFANTILE CEREBRAL, 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   Qualifier: Diagnosis of  By: Tracy Friedlander  Working with PMR Dr Emelie Hanger and tolerating Botox  injections in hips, shoulders, etc with good results   Skin lesion of right ear 03/16/2017   Thyroid  disease 08/01/2010   Qualifier: Diagnosis of  By: Misty Amour CMA (AAMA), Dottie     Past Surgical History:  Procedure Laterality Date   baclofen  trial     baslofen pump implant     ears tubes     EYE SURGERY     FLEXIBLE SIGMOIDOSCOPY N/A 09/07/2014   Procedure: FLEXIBLE SIGMOIDOSCOPY;  Surgeon: Nannette Babe, MD;  Location: [facility] ENDOSCOPY;  Service: Endoscopy;  Laterality: N/A;   g-tube insert  August 2006   hamstring released     to treat contractures.    HIP SURGERY     x2 , side    IR CM INJ ANY COLONIC TUBE W/FLUORO  05/28/2017   IR CM INJ ANY COLONIC TUBE W/FLUORO  07/07/2019   IR CM INJ ANY COLONIC TUBE W/FLUORO  09/18/2019   IR CM INJ ANY COLONIC TUBE W/FLUORO  05/21/2023   IR GASTR TUBE CONVERT GASTR-JEJ PER W/FL MOD SED  03/14/2019   IR GENERIC HISTORICAL  07/01/2016   IR GASTR TUBE CONVERT GASTR-JEJ PER W/FL MOD SED 07/01/2016 Erica Hau, MD [facility]   IR GENERIC HISTORICAL  07/08/2016   IR [name] EVAL TECH 0-60 MINS 07/08/2016 Erica Hau, MD [facility]   IR GENERIC HISTORICAL  07/14/2016   IR GJ TUBE CHANGE 07/14/2016 Robbi Childs, MD [facility]   IR GENERIC HISTORICAL  07/21/2016   IR [name] EVAL TECH 0-60 MINS [facility]    IR GENERIC HISTORICAL  08/31/2016   IR REPLC DUODEN/JEJUNO TUBE PERCUT W/FLUORO 08/31/2016 Lucinda Saber, MD [facility]   IR GENERIC HISTORICAL  09/03/2016   IR GJ TUBE CHANGE 09/03/2016 Robbi Childs, MD [facility]   IR GENERIC HISTORICAL  09/10/2016   IR GASTR TUBE CONVERT GASTR-JEJ PER W/FL MOD SED 09/10/2016 [facility]   IR GENERIC HISTORICAL  09/16/2016   IR [name] EVAL TECH 0-60 MINS [facility]   IR GENERIC HISTORICAL  09/29/2016   IR GJ TUBE CHANGE 09/29/2016 Marland Silvas, MD [facility]   IR GJ TUBE CHANGE  02/05/2017   IR GJ TUBE CHANGE  05/21/2017   IR GJ TUBE CHANGE  08/25/2017   IR GJ TUBE CHANGE  01/18/2018   IR GJ TUBE CHANGE  02/04/2018   IR GJ TUBE CHANGE  04/21/2018   IR GJ TUBE CHANGE  08/18/2018   IR GJ TUBE CHANGE  09/12/2019   IR GJ TUBE CHANGE  01/03/2020   IR GJ TUBE CHANGE  05/13/2020   IR GJ TUBE CHANGE  09/11/2020   IR GJ TUBE CHANGE  02/27/2021   IR GJ TUBE CHANGE  08/01/2021   IR GJ TUBE CHANGE  12/31/2021   IR GJ TUBE CHANGE  05/11/2022   IR GJ TUBE CHANGE  07/01/2022   IR GJ TUBE CHANGE  01/28/2023   IR MECH REMOV OBSTRUC MAT ANY COLON TUBE W/FLUORO  09/02/2018   IR REPLC GASTRO/COLONIC TUBE PERCUT W/FLUORO  10/17/2018   IR REPLC GASTRO/COLONIC TUBE PERCUT W/FLUORO  04/02/2023   IR REPLC GASTRO/COLONIC TUBE PERCUT W/FLUORO  11/18/2023   PEG PLACEMENT  10/21/2011   Procedure: PERCUTANEOUS ENDOSCOPIC GASTROSTOMY (PEG) REPLACEMENT;  Surgeon: Pietro Bridegroom, MD;  Location: [facility];  Service: Endoscopy;  Laterality: N/A;   PEG PLACEMENT N/A 06/13/2013   Procedure: PERCUTANEOUS ENDOSCOPIC GASTROSTOMY (PEG) REPLACEMENT;  Surgeon: Pietro Bridegroom, MD;  Location: [facility];  Service: Endoscopy;  Laterality: N/A;   SPINAL FUSION     spinal fusion to correct 70 degree kyphosis  11-2010   spinal fusioncorrect 106 degree kyphosis     SPINE SURGERY  ,11/20/2010, 2011   for correction of severe contracturing spinal kyphosis.    TONSILLECTOMY      Social History:   reports that [name] has never smoked. [name] has never used smokeless tobacco. [name] reports that [name] does not drink alcohol and does not use drugs.   Allergies  Allergen Reactions   Ambien  [Zolpidem  Tartrate] Nausea Only   Antihistamines, Chlorpheniramine-Type     Other reaction(s): Other (See Comments) Other Reaction: agitation   Augmentin  [Amoxicillin -Pot Clavulanate] Diarrhea   Cefdinir  Diarrhea    Not tolerate well and cause diarrhea   Codeine  Other (See Comments)    Makes [name] too active after a few days.   Zolpidem      Other Reaction(s): GI Intolerance  Baclofen  Anxiety and Swelling    anxiety   Metoclopramide  Other (See Comments) and Swelling    Delusion, emotionality  Other Reaction(s): Mental Status Changes   Pheniramine Rash    Other reaction(s): Other (See Comments)  Other Reaction: agitation  Other Reaction(s): Other (See Comments)  Other reaction(s): Other (See Comments), Other Reaction: agitation   Sulfa Antibiotics Rash   Sulfonamide Derivatives Rash    Family History  Problem Relation Age of Onset   Asthma Mother    Hyperlipidemia Mother    COPD Mother    Other Mother        bronchial stasis/ABPA   Cancer Maternal Grandmother 28       breast   Hyperlipidemia Maternal Grandmother    Hypertension Maternal Grandmother    Cancer Maternal Grandfather        prostate   Heart disease Paternal Grandfather        CHF   Osteoporosis Paternal Grandmother    Arthritis Paternal Grandmother        rheumatoid      Prior to Admission medications   Medication Sig Start Date End Date Taking? Authorizing Provider  albuterol  (PROVENTIL ) (2.5 MG/3ML) 0.083% nebulizer solution Take 3 mLs (2.5 mg total) by nebulization every 4 (four) hours as needed for wheezing or shortness of breath. 01/20/24  Yes Everlina Hock, NP  clotrimazole -betamethasone  (LOTRISONE ) cream Apply 1 Application topically 2 (two) times daily as needed. 03/01/24  Yes Everlina Hock, NP  dantrolene   (DANTRIUM ) 100 MG capsule Take 1 capsule (100 mg total) by mouth 3 (three) times daily. [name] taking differently: Place 100 mg into feeding tube 3 (three) times daily. 01/12/24  Yes Rawland Caddy, MD  DEPAKOTE  SPRINKLES 125 MG capsule Take 250-375 mg by mouth See admin instructions. Take 375mg  (3 capsules) by mouth at 0900, 250mg  (2 capsules) at 2030, and 250mg  (2 capsules) at 2130. G tube 06/13/21  Yes [provider]  Dextromethorphan -guaiFENesin  5-100 MG/5ML LIQD Give 20 mLs by tube every 6 (six) hours as needed (Cough).   Yes [provider]  diazepam  (VALIUM ) 5 MG tablet Place 5 mg into feeding tube daily as needed for anxiety.   Yes [provider]  doxepin  (SINEQUAN ) 25 MG capsule Place 25 mg into feeding tube at bedtime. 04/24/23  Yes [provider]  escitalopram  (LEXAPRO ) 5 MG/5ML solution Place 20 mLs into feeding tube daily. 03/24/23  Yes [provider]  lamoTRIgine  (LAMICTAL ) 25 MG tablet TAKE 2 TABLETS BY MOUTH 2 TIMES DAILY. [name] taking differently: Place 50 mg into feeding tube 2 (two) times daily. 08/09/23  Yes Phebe Brasil, MD  LORazepam  (ATIVAN ) 1 MG tablet Place 1 mg into feeding tube at bedtime. 02/24/23  Yes [provider]  Nutritional Supplements (FEEDING SUPPLEMENT, KATE FARMS STANDARD 1.4,) LIQD liquid Take 325 mLs by mouth as directed. 3 carton over 16 hrs   Yes [provider]  OLANZapine  (ZYPREXA ) 10 MG tablet Place 10 mg into feeding tube at bedtime. 06/11/22  Yes [provider]  OLANZapine  (ZYPREXA ) 5 MG tablet Place 5 mg into feeding tube daily. @1700  02/12/21  Yes [provider]  OVER Chad COUNTER MEDICATION Give 1 capsule by tube 2 (two) times daily. Muscle Calm Formula   Yes [provider]  OVER Chad COUNTER MEDICATION Give 1 capsule by tube at bedtime. CBD/Melaonin   Yes [provider]  oxyBUTYnin  (DITROPAN ) 5 MG/5ML solution Place 7.5 mg into feeding tube at  bedtime. 03/31/23  Yes [provider]  Saccharomyces boulardii (FLORASTOR PO) Give 1 capsule by tube every evening.   Yes [provider]  sucralfate  (CARAFATE ) 1 g tablet Take 1 tablet (1 g total) by mouth daily. TAKE 1 TABLET BY MOUTH 4 TIMES DAILY WITH MEALS AND AT BEDTIME [name] taking differently: Place 1 g into feeding tube daily. 10/08/23  Yes Zehr, Jessica D, PA-C  traZODone  (DESYREL ) 150 MG tablet Place 150 mg into feeding tube at bedtime. 12/31/23  Yes [provider]  HYDROcodone  bit-homatropine (HYDROMET) 5-1.5 MG/5ML syrup Place 5 mLs into feeding tube every 6 (six) hours as needed for cough. [name] not taking: Reported on 03/03/2024 02/16/24   Minna Amass B, NP  levofloxacin  (LEVAQUIN ) 750 MG tablet Take 1 tablet (750 mg total) by mouth daily. X 7 days [name] not taking: Reported on 03/03/2024 03/01/24   Harris, Abigail, PA-C  omeprazole  (PRILOSEC) 20 MG capsule Take 2 capsules (40 mg total) by mouth daily. [name] not taking: Reported on 03/03/2024 12/27/23   Zehr, Martina Sledge, PA-C    Physical Exam: BP (!) 140/88   Pulse (!) 107   Temp (!) 100.7 F (38.2 C) (Axillary)   Resp (!) 44   SpO2 96%   General: 38 y.o. year-old male frail-appearing in no acute distress.  Alert. Cardiovascular: Tachycardic with no rubs or gallops.  No thyromegaly or JVD noted.  No lower extremity edema. 2/4 pulses in all 4 extremities. Respiratory: Clear to auscultation with no wheezes or rales. Good inspiratory effort. Abdomen: Soft nontender nondistended with normal bowel sounds x4 quadrants.  GJ tube in place.  Left urostomy tube in place. Muskuloskeletal: No cyanosis, clubbing or edema noted bilaterally Neuro: Spastic quadriplegia Skin: No ulcerative lesions noted or rashes Psychiatry: Mood is appropriate for condition and setting          Labs on Admission:  Basic Metabolic Panel: Recent Labs  Lab 03/01/24 1510 03/03/24 1115  NA 135 137  K 3.9 3.3*  CL 97* 98   CO2 22 17*  GLUCOSE 88 40*  BUN 6 6  CREATININE <0.30* 0.70  CALCIUM  9.6 9.6   Liver Function Tests: Recent Labs  Lab 03/01/24 1510 03/03/24 1115  AST 19 28  ALT 14 17  ALKPHOS 74 66  BILITOT 0.4 1.6*  PROT 7.4 7.4  ALBUMIN 4.5 4.3   No results for input(s): "LIPASE", "AMYLASE" in Chad last 168 hours. No results for input(s): "AMMONIA" in Chad last 168 hours. CBC: Recent Labs  Lab 03/01/24 1510 03/03/24 1115  WBC 8.4 5.7  NEUTROABS 5.8 3.8  HGB 14.3 15.0  HCT 41.7 46.1  MCV 92.3 95.6  PLT 220 209   Cardiac Enzymes: No results for input(s): "CKTOTAL", "CKMB", "CKMBINDEX", "TROPONINI" in Chad last 168 hours.  BNP (last 3 results) No results for input(s): "BNP" in Chad last 8760 hours.  ProBNP (last 3 results) No results for input(s): "PROBNP" in Chad last 8760 hours.  CBG: Recent Labs  Lab 03/03/24 1018 03/03/24 1113 03/03/24 1154 03/03/24 1922  GLUCAP 50* 54* 237* 78    Radiological Exams on Admission: CT Chest W Contrast Result Date: 03/03/2024 CLINICAL DATA:  Pneumonia. EXAM: CT CHEST WITH CONTRAST TECHNIQUE: Multidetector CT imaging of Chad chest was performed during intravenous contrast administration. RADIATION DOSE REDUCTION: This exam was performed according to Chad departmental dose-optimization program which includes automated exposure control, adjustment of the mA and/or kV according to [name] size and/or use of iterative reconstruction technique. CONTRAST:  50mL OMNIPAQUE  IOHEXOL  350  MG/ML SOLN COMPARISON:  Chest CT dated 02/05/2024. FINDINGS: Evaluation is very limited due to respiratory motion as well as streak artifact caused by [name]'s arms and spinal hardware. Cardiovascular: There is no cardiomegaly or pericardial effusion. Chad thoracic aorta is unremarkable. Chad origins of Chad great vessels of Chad aortic arch and Chad central pulmonary arteries appear patent. Mediastinum/Nodes: No hilar or mediastinal adenopathy. Chad esophagus is grossly unremarkable.  No mediastinal fluid collection. Lungs/Pleura: Small loculated appearing right pleural effusion. Rounded consolidative changes in Chad right lower lobe likely round atelectasis. Pneumonia or mass is not excluded. Attention on follow-up imaging recommended. Upper Abdomen: Partially visualized percutaneous gastrostomy. Musculoskeletal: Osteopenia with scoliosis and degenerative changes. Spinal fusion hardware. No acute osseous pathology. IMPRESSION: 1. Small loculated appearing right pleural effusion. 2. Probably rounded atelectasis in Chad right lower lobe similar to prior CT. Attention on follow-up imaging recommended. Electronically Signed   By: Angus Bark M.D.   On: 03/03/2024 17:56    EKG: I independently viewed Chad EKG done and my findings are as followed: None available at Chad time of this visit.  Assessment/Plan Present on Admission:  Fever  Principal Problem:   Fever  Recurrent fevers History of recurrent right lower lobe pneumonia Sepsis secondary to right lower lobe pneumonia, POA Presented with fever 100.7, right lower lobe loculated pleural effusion seen on CT scan and personally reviewed, productive cough, tachycardia, and tachypnea Currently on Merrem  Continue IV fluid hydration Follow MRSA screening test, sputum culture, peripheral blood cultures x 2, lactic acid, baseline procalcitonin. Maintain MAP greater than 65 Closely monitor fever curve and WBCs Aspiration precautions Continue pulmonary toilette, guaifenesin  twice daily x 3 days, hyper saline nebs twice daily x 3 days, chest physiotherapy 3 times daily x 3 days, bronchodilators. Oral suctioning as needed  High anion gap metabolic acidosis Presented with serum bicarb of 17 with anion gap of 22 Follow lactic acid level Continue IV fluid hydration Repeat chemistry panel in Chad morning  Hyperbilirubinemia T. bili 1.6, nonspecific Repeat CMP in Chad morning  Cerebral palsy with spastic quadriplegia Resume home  regimen  Hypokalemia Replete electrolytes as needed through GJ-tube GJ tube was replaced today by IR on 03/03/24 Check magnesium level  Esophageal dysphagia Resume tube feeding Aspiration precautions Maintain head of bed greater than 35 degree during tube feeding  Hypoglycemia, resolved Presented with serum glucose of 40 Treated in Chad ER Resume tube feeding.   Critical care time: 65 minutes.   DVT prophylaxis: Subcu Lovenox  daily.  Code Status: DNR/DNI.  Family Communication: Updated [name]'s mother and father at bedside.  Disposition Plan: Admitted to progressive care unit.  Consults called: Pulmonary consulted by EDP.  Admission status: Inpatient status.   Status is: Inpatient [name] requires at least 2 midnights for further evaluation and treatment of present condition.   Bary Boss MD [name] Pager [phone]  If 7PM-7AM, please contact night-coverage [email or web address] Password [facility]  03/03/2024, 7:31 PM

## 2024-03-03 NOTE — ED Provider Notes (Signed)
 Care was taken over from Dr. Jeannie Milo.  Patient is a 38 year old with cerebral palsy who presents with recurrent pneumonia.  He is noted to be febrile here with a temp of 100.7.  CT scan shows ongoing consolidation/atelectasis in the right lower lung with loculated pleural effusion.  He was started on IV antibiotics.  He has had multiple side effects from medications so was started on Levaquin  for now.  I discussed with Dr. Del Favia who will admit the patient.  Also discussed with pulmonology who will put the patient on their consult list for possible need for bronchoscopy.   Chad Los, MD 03/03/24 [phone]

## 2024-03-03 NOTE — ED Notes (Addendum)
Patient in IR

## 2024-03-03 NOTE — ED Notes (Signed)
 IR has sent for patient

## 2024-03-03 NOTE — ED Notes (Signed)
 Patient transported to CT

## 2024-03-03 NOTE — ED Notes (Signed)
 EDP Belfi,MD notified of temp of 100.7

## 2024-03-03 NOTE — ED Notes (Addendum)
 Patient transported to IR

## 2024-03-03 NOTE — ED Notes (Signed)
 Pt rolled to left side with pillows behind his back to ease coughing. Coughing resolved

## 2024-03-03 NOTE — Progress Notes (Signed)
 Pharmacy Antibiotic Note  [name] is a 38 y.o. male for which pharmacy has been consulted for vancomycin  dosing for pneumonia.  Patient with a history of cerebral palsy and spastic quadriplegia, s/p GJ tube placement, post left urostomy tube placement, recurrent right lower lobe pneumonia, GERD . Patient with recent admission for pna. Patient is presenting after return of fever and cough after completing antibiotic course.  SCr 0.7 -- electric power chair bound WBC 5.7; T 100.7; HR 107; RR 44  Received levaquin  x 1 in the ED.  Plan: Meropenem  per MD Vancomycin  750 mg once then 500 mg q12hr (eAUC 511.7) unless change in renal function Monitor WBC, fever, renal function, cultures De-escalate when able F/u MRSA PCR     Temp (24hrs), Avg:99.6 F (37.6 C), Min:98.3 F (36.8 C), Max:100.7 F (38.2 C)  Recent Labs  Lab 03/01/24 1510 03/03/24 1115  WBC 8.4 5.7  CREATININE <0.30* 0.70  LATICACIDVEN 0.7  --     CrCl cannot be calculated (Unknown ideal weight.).    Allergies  Allergen Reactions   Ambien  [Zolpidem  Tartrate] Nausea Only   Antihistamines, Chlorpheniramine-Type     Other reaction(s): Other (See Comments) Other Reaction: agitation   Augmentin  [Amoxicillin -Pot Clavulanate] Diarrhea   Cefdinir  Diarrhea    Not tolerate well and cause diarrhea   Codeine  Other (See Comments)    Makes patient too active after a few days.   Zolpidem      Other Reaction(s): GI Intolerance   Baclofen  Anxiety and Swelling    anxiety   Metoclopramide  Other (See Comments) and Swelling    Delusion, emotionality  Other Reaction(s): Mental Status Changes   Pheniramine Rash    Other reaction(s): Other (See Comments)  Other Reaction: agitation  Other Reaction(s): Other (See Comments)  Other reaction(s): Other (See Comments), Other Reaction: agitation   Sulfa Antibiotics Rash   Sulfonamide Derivatives Rash   Microbiology results: Pending  Thank you for allowing pharmacy to be  a part of this patient's care.  Dionicio Fray, PharmD, BCPS 03/03/2024 7:46 PM ED Clinical Pharmacist -  [phone]

## 2024-03-04 DIAGNOSIS — R509 Fever, unspecified: Secondary | ICD-10-CM | POA: Diagnosis not present

## 2024-03-04 DIAGNOSIS — J189 Pneumonia, unspecified organism: Secondary | ICD-10-CM | POA: Diagnosis not present

## 2024-03-04 DIAGNOSIS — J690 Pneumonitis due to inhalation of food and vomit: Secondary | ICD-10-CM | POA: Diagnosis not present

## 2024-03-04 DIAGNOSIS — A419 Sepsis, unspecified organism: Secondary | ICD-10-CM

## 2024-03-04 LAB — CBC
HCT: 37.8 % — ABNORMAL LOW (ref 39.0–52.0)
Hemoglobin: 12.4 g/dL — ABNORMAL LOW (ref 13.0–17.0)
MCH: 31.2 pg (ref 26.0–34.0)
MCHC: 32.8 g/dL (ref 30.0–36.0)
MCV: 95.2 fL (ref 80.0–100.0)
Platelets: 191 10*3/uL (ref 150–400)
RBC: 3.97 MIL/uL — ABNORMAL LOW (ref 4.22–5.81)
RDW: 12.4 % (ref 11.5–15.5)
WBC: 4.9 10*3/uL (ref 4.0–10.5)
nRBC: 0 % (ref 0.0–0.2)

## 2024-03-04 LAB — COMPREHENSIVE METABOLIC PANEL WITH GFR
ALT: 17 U/L (ref 0–44)
AST: 23 U/L (ref 15–41)
Albumin: 3.2 g/dL — ABNORMAL LOW (ref 3.5–5.0)
Alkaline Phosphatase: 46 U/L (ref 38–126)
Anion gap: 13 (ref 5–15)
BUN: <5 mg/dL — ABNORMAL LOW (ref 6–20)
CO2: 21 mmol/L — ABNORMAL LOW (ref 22–32)
Calcium: 8.5 mg/dL — ABNORMAL LOW (ref 8.9–10.3)
Chloride: 105 mmol/L (ref 98–111)
Creatinine, Ser: 0.59 mg/dL — ABNORMAL LOW (ref 0.61–1.24)
GFR, Estimated: >60 mL/min (ref >60)
Glucose, Bld: 63 mg/dL — ABNORMAL LOW (ref 70–99)
Potassium: 3.3 mmol/L — ABNORMAL LOW (ref 3.5–5.1)
Sodium: 139 mmol/L (ref 135–145)
Total Bilirubin: 1.4 mg/dL — ABNORMAL HIGH (ref 0.0–1.2)
Total Protein: 5.8 g/dL — ABNORMAL LOW (ref 6.5–8.1)

## 2024-03-04 LAB — GLUCOSE, CAPILLARY
Glucose-Capillary: 136 mg/dL — ABNORMAL HIGH (ref 70–99)
Glucose-Capillary: 150 mg/dL — ABNORMAL HIGH (ref 70–99)

## 2024-03-04 LAB — CBG MONITORING, ED: Glucose-Capillary: 114 mg/dL — ABNORMAL HIGH (ref 70–99)

## 2024-03-04 LAB — MAGNESIUM: Magnesium: 1.9 mg/dL (ref 1.7–2.4)

## 2024-03-04 LAB — PHOSPHORUS: Phosphorus: 3.5 mg/dL (ref 2.5–4.6)

## 2024-03-04 LAB — MRSA NEXT GEN BY PCR, NASAL: MRSA by PCR Next Gen: NOT DETECTED

## 2024-03-04 LAB — LACTIC ACID, PLASMA: Lactic Acid, Venous: 0.9 mmol/L (ref 0.5–1.9)

## 2024-03-04 MED ORDER — KATE FARMS STANDARD 1.4 PO LIQD
960.0000 mL | ORAL | Status: AC
  Administered 2024-03-04: 975 mL
  Filled 2024-03-04: qty 975

## 2024-03-04 MED ORDER — POTASSIUM CHLORIDE 20 MEQ PO PACK
60.0000 meq | PACK | Freq: Once | ORAL | Status: AC
  Administered 2024-03-04: 60 meq
  Filled 2024-03-04: qty 3

## 2024-03-04 MED ORDER — KATE FARMS STANDARD 1.4 EN LIQD
1000.0000 mL | Freq: Every day | ENTERAL | Status: DC
  Administered 2024-03-05: 1000 mL
  Filled 2024-03-04 (×2): qty 1000

## 2024-03-04 MED ORDER — FREE WATER
200.0000 mL | Freq: Four times a day (QID) | Status: DC
  Administered 2024-03-04 – 2024-03-06 (×8): 200 mL

## 2024-03-04 MED ORDER — ADULT MULTIVITAMIN W/MINERALS CH
1.0000 | ORAL_TABLET | Freq: Every day | ORAL | Status: DC
  Administered 2024-03-04 – 2024-03-06 (×3): 1
  Filled 2024-03-04 (×3): qty 1

## 2024-03-04 MED ORDER — THIAMINE MONONITRATE 100 MG PO TABS
100.0000 mg | ORAL_TABLET | Freq: Every day | ORAL | Status: DC
  Administered 2024-03-04 – 2024-03-06 (×3): 100 mg
  Filled 2024-03-04 (×3): qty 1

## 2024-03-04 MED ORDER — KATE FARMS STANDARD 1.4 PO LIQD
990.0000 mL | ORAL | Status: DC
  Filled 2024-03-04: qty 1300

## 2024-03-04 NOTE — Progress Notes (Signed)
 Initial Nutrition Assessment  DOCUMENTATION CODES:   Underweight, Not applicable  INTERVENTION:   Initiate nocturnal tube feeding via J-tube: (1800-0600) [employer] 1.4 at 80 ml/h (960 ml per day) 3 Cartons total   Provides 1365 kcal, 60 gm protein, 693 ml free water daily  After nocturnal tube feeds resume normal home tube feed regimen of  Tube feeding via J-tube: Polly [employer]ink [employer] 1.4 at 90 ml/h (990 ml per day) from 0900-2000 3 Cartons per day total 200 ml FWF Q6H  Provides 1365 kcal, 60 gm protein, 693 ml free water daily (1493 ml water daily TF + FWF)  Monitor magnesium, potassium, and phosphorus BID for at least 3 days, MD to replete as needed, as pt is at risk for refeeding syndrome  Multivitamin with minerals daily 100 mg Thiamine x 7 days   NUTRITION DIAGNOSIS:   Inadequate oral intake related to dysphagia, chronic illness as evidenced by NPO status.   GOAL:   Patient will meet greater than or equal to 90% of their needs   MONITOR:   TF tolerance, Labs, I & O's  REASON FOR ASSESSMENT:   Consult Enteral/tube feeding initiation and management  ASSESSMENT:  38 y.o Male with hx of cerebral palsy and spastic quadriplegic-uses electric power chair, dysphagia s/p GJ tube placement, left urostomy tube, recurrent PNA, GERD. Presented with Fever, sepsis, cough and gurgling for 3 days.  5/2 - GJ tube exchange in IR   RD working remotely, called patients mother linda who was bale to provide history for patient. Additional information also obtained through chart review.   Patient has relied on tube feeds for years and has had recurrent issues with aspiration pneumonia for some time now. Patient was recently admitted for right lower lobe pneumonia and was discharged home then sent to a care facility. Mom reports since being at care facility his aspiration pneumonia episodes have been getting worse.   Mom reports he started having fevers 3-4 days ago and since then has been  refusing formula. Instead she shares that they have been administering Gatorade through his J-tube. Mom shares concern that patient is receiving bolus tube feeds but that is not what they do at home. Changed tube feeds to better align with home TF regimen. Mom wanted to start nocturnal tube feeds tonight due to patient being behind on his feedings. When TF started monitor lytes and add thiamine as Pt is at risk of refeeding. Noted patient has been hypoglycemic.   Mom reports weight has been stable however last weight she has on record was 1 month ago at 92 lbs. Recommend new weight. Suspect patient is malnourished but unable to determine at this time.   Home TF regimen: Johny Nap 1.4 at 100 ml/h typically from 0900-2000 or until 3 cartons are used 100 ml FWF every hour tube feeds are running   Provides 1365 kcal, 60 gm protein, 693 ml free water daily (1793 ml water daily TF + FWF)  Admit weight: 41.7 kg Current weight: 41.7 kg   Nutritionally Relevant Medications: Scheduled Meds:  escitalopram   20 mg Per Tube Daily   free water  200 mL Per Tube Q6H   guaiFENesin   15 mL Per Tube BID   ipratropium-albuterol   3 mL Nebulization Q6H   lamoTRIgine   50 mg Per Tube BID   LORazepam   1 mg Per Tube QHS   multivitamin with minerals  1 tablet Per Tube Daily   thiamine  100 mg Per Tube Daily   traZODone   150 mg Per Tube QHS   Continuous Infusions:  feeding supplement (KATE FARMS STANDARD 1.4)     [START ON 03/05/2024] feeding supplement (KATE FARMS STANDARD 1.4)     lactated ringers  75 mL/hr at 03/04/24 1600   meropenem  (MERREM ) IV Stopped (03/04/24 1422)   vancomycin  Stopped (03/04/24 1042)    Labs Reviewed: Potassium 3.3, BUN <5, Creatinine 0.59, Calcium  8.5, Total protein 5.8, Total bilirubin 1.4 CBG ranges from 50-114 mg/dL over the last 24 hours  NUTRITION - FOCUSED PHYSICAL EXAM:  -Deferred to follow up   Diet Order:   Diet Order             Diet NPO time specified  Diet  effective now                   EDUCATION NEEDS:   Not appropriate for education at this time  Skin:  Skin Assessment: Reviewed RN Assessment  Last BM:  PTA  Height:   Ht Readings from Last 1 Encounters:  12/29/23 5' (1.524 m)    Weight:   Wt Readings from Last 1 Encounters:  03/03/24 41.7 kg    BMI:  Body mass index is 17.97 kg/m.  Estimated Nutritional Needs:   Kcal:  1300-1500 kcal  Protein:  60-80 gm  Fluid:  >1.3L/day   Frederik Jansky, RD Registered Dietitian  See Amion for more information

## 2024-03-04 NOTE — ED Notes (Signed)
 Parent at bedside informs this RN pt usually gets continuous feeds at home & none at night & not sure how he will tol bolus feeds while here in the ED. He did tolerate the feed given by this RN & when returned to bedside to give the Potassium ordered (to spread out the amount of bolus given at once) he was sleeping & mother asked to not wake him up since he has not been sleeping well lately.

## 2024-03-04 NOTE — Progress Notes (Signed)
 PROGRESS NOTE  [name]:[id number] DOB: 04-29-1986   PCP: Neda Balk, MD  Patient is from: ALF  DOA: 03/03/2024 LOS: 1  Chief complaints Chief Complaint  Patient presents with   Fever   GTube infection     Brief Narrative / Interim history: 38 year old F with PMH of cerebral palsy with spastic quadriplegia, esophageal dysphagia on TF via J-tube, posterior left urostomy tube placement, GERD and recurrent RLL pneumonia returning with fever, productive cough and gurgling for about 3 days, and admitted with sepsis due to recurrent RLL pneumonia.    In ED, febrile to 100.7 with mild tachycardia and tachypnea.  Hypoglycemic to 54.  K3.3.  WBC 5.7.  IR replaced GJ tube.  CT chest showed small loculated appearing right pleural effusion with possible rounded atelectasis in RLL similar to prior CT. PCCM consulted.  Cultures ordered.  Patient was started on IV meropenem  and vancomycin , and admitted.   Subjective: Seen and examined earlier this morning.  Patient is nonverbal.  Father at bedside.  Father feels he is improving.   Objective: Vitals:   03/04/24 0930 03/04/24 0935 03/04/24 1000 03/04/24 1100  BP: (!) 142/89  (!) 143/87 (!) 159/108  Pulse: (!) 104  (!) 115 (!) 113  Resp: (!) 23  (!) 22 20  Temp:  98.1 F (36.7 C)    TempSrc:  Axillary    SpO2: 97%  97% 96%  Weight:        Examination:  GENERAL: No apparent distress.  Frail. HEENT: MMM.  Vision and hearing grossly intact.  NECK: Supple.  No apparent JVD.  RESP:  No IWOB.  Fair aeration bilaterally. CVS:  RRR. Heart sounds normal.  ABD/GI/GU: BS+. Abd soft.  GJ tube in place. MSK/EXT: Spastic quadriplegia with muscle mass and subcu fat loss. SKIN: no apparent skin lesion or wound NEURO: Awake.  Nonverbal.  Spastic quadriplegia PSYCH: Calm. Normal affect.   Consultants:  PCCM  Procedures: 5/2-GJ tube exchange  Microbiology summarized: Blood cultures NGTD Sputum culture pending MRSA PCR screen  pending  Assessment and plan: Sepsis secondary to right lower lobe pneumonia, POA: Concern for aspiration pneumonia/pneumonitis.  Recurrent issue.  Febrile to 100.7 with tachycardia and tachypnea.  CT chest as above.  No leukocytosis.  Pro-Cal negative. -Continue IV meropenem  and vancomycin  -Follow MRSA PCR screen and sputum culture -De-escalate antibiotics as appropriate. -Aspiration precaution and pulmonary toilet.   High anion gap metabolic acidosis: Improved -Continue monitoring   Cerebral palsy with spastic quadriplegia: Stable.  Total care. -Continue home meds -Supportive care   Hypokalemia -Monitor replenish as appropriate  Esophageal dysphagia: GJ tube replaced on 5/2 -Continue tube feed  Mood disorder/anxiety/depression - Continue home meds   Level 2 hypoglycemia: BMP glucose 40 on admission.  Resolved. -Continue tube feed -Monitor CBG every 6 hours -Dietitian consulted  Severe protein calorie malnutrition Body mass index is 17.97 kg/m.           DVT prophylaxis:  enoxaparin  (LOVENOX ) injection 30 mg Start: 03/03/24 2200  Code Status: DNR Family Communication: Updated patient's father at bedside Level of care: Progressive Status is: Inpatient Remains inpatient appropriate because: Aspiration pneumonia   Final disposition: To be determined   55 minutes with more than 50% spent in reviewing records, counseling patient/family and coordinating care.   Sch Meds:  Scheduled Meds:  acidophilus  1 capsule Oral TID WC   dantrolene   100 mg Per Tube TID   divalproex   250 mg Oral Q24H   divalproex   250 mg Oral Q24H   divalproex   375 mg Oral Daily   doxepin   25 mg Per Tube QHS   enoxaparin  (LOVENOX ) injection  30 mg Subcutaneous Q24H   escitalopram   20 mg Per Tube Daily   free water  200 mL Per Tube Q6H   guaiFENesin   15 mL Per Tube BID   ipratropium-albuterol   3 mL Nebulization Q6H   lamoTRIgine   50 mg Per Tube BID   LORazepam   1 mg Per Tube QHS    OLANZapine   10 mg Per Tube QHS   OLANZapine   5 mg Per Tube QPM   oxyBUTYnin   7.5 mg Per Tube QHS   potassium chloride   60 mEq Per Tube Once   saccharomyces boulardii  250 mg Per Tube QPM   sodium chloride  HYPERTONIC  4 mL Nebulization BID   sucralfate   1 g Per Tube Daily   traZODone   150 mg Per Tube QHS   Continuous Infusions:  lactated ringers  75 mL/hr at 03/04/24 0938   meropenem  (MERREM ) IV Stopped (03/04/24 0720)   vancomycin  Stopped (03/04/24 1042)   PRN Meds:.acetaminophen , albuterol , diazepam , diazepam , feeding supplement ([employer] STANDARD 1.4), guaiFENesin -dextromethorphan , polyethylene glycol, prochlorperazine  Antimicrobials: Anti-infectives (From admission, onward)    Start     Dose/Rate Route Frequency Ordered Stop   03/04/24 1000  vancomycin  (VANCOREADY) IVPB 500 mg/100 mL        500 mg 100 mL/hr over 60 Minutes Intravenous Every 12 hours 03/03/24 2031     03/03/24 2200  meropenem  (MERREM ) 1 g in sodium chloride  0.9 % 100 mL IVPB        1 g 200 mL/hr over 30 Minutes Intravenous Every 8 hours 03/03/24 1936     03/03/24 1945  vancomycin  (VANCOREADY) IVPB 750 mg/150 mL        750 mg 150 mL/hr over 60 Minutes Intravenous  Once 03/03/24 1936 03/03/24 2141   03/03/24 1845  levofloxacin  (LEVAQUIN ) IVPB 750 mg  Status:  Discontinued        750 mg 100 mL/hr over 90 Minutes Intravenous  Once 03/03/24 1831 03/03/24 2020        I have personally reviewed the following labs and images: CBC: Recent Labs  Lab 03/01/24 1510 03/03/24 1115 03/04/24 0513  WBC 8.4 5.7 4.9  NEUTROABS 5.8 3.8  --   HGB 14.3 15.0 12.4*  HCT 41.7 46.1 37.8*  MCV 92.3 95.6 95.2  PLT 220 209 191   BMP &GFR Recent Labs  Lab 03/01/24 1510 03/03/24 1115 03/04/24 0513  NA 135 137 139  K 3.9 3.3* 3.3*  CL 97* 98 105  CO2 22 17* 21*  GLUCOSE 88 40* 63*  BUN 6 6 <5*  CREATININE <0.30* 0.70 0.59*  CALCIUM  9.6 9.6 8.5*  MG  --   --  1.9  PHOS  --   --  3.5   Estimated Creatinine  Clearance: 73.1 mL/min (A) (by C-G formula based on SCr of 0.59 mg/dL (L)). Liver & Pancreas: Recent Labs  Lab 03/01/24 1510 03/03/24 1115 03/04/24 0513  AST 19 28 23   ALT 14 17 17   ALKPHOS 74 66 46  BILITOT 0.4 1.6* 1.4*  PROT 7.4 7.4 5.8*  ALBUMIN 4.5 4.3 3.2*   No results for input(s): "LIPASE", "AMYLASE" in the last 168 hours. No results for input(s): "AMMONIA" in the last 168 hours. Diabetic: No results for input(s): "HGBA1C" in the last 72 hours. Recent Labs  Lab 03/03/24 1018 03/03/24 1113 03/03/24 1154 03/03/24  1922  GLUCAP 50* 54* 237* 78   Cardiac Enzymes: No results for input(s): "CKTOTAL", "CKMB", "CKMBINDEX", "TROPONINI" in the last 168 hours. No results for input(s): "PROBNP" in the last 8760 hours. Coagulation Profile: Recent Labs  Lab 03/01/24 1510  INR 1.0   Thyroid  Function Tests: No results for input(s): "TSH", "T4TOTAL", "FREET4", "T3FREE", "THYROIDAB" in the last 72 hours. Lipid Profile: No results for input(s): "CHOL", "HDL", "LDLCALC", "TRIG", "CHOLHDL", "LDLDIRECT" in the last 72 hours. Anemia Panel: No results for input(s): "VITAMINB12", "FOLATE", "FERRITIN", "TIBC", "IRON", "RETICCTPCT" in the last 72 hours. Urine analysis:    Component Value Date/Time   COLORURINE YELLOW 03/01/2024 1800   APPEARANCEUR CLEAR 03/01/2024 1800   LABSPEC 1.020 03/01/2024 1800   PHURINE 7.0 03/01/2024 1800   GLUCOSEU NEGATIVE 03/01/2024 1800   GLUCOSEU NEGATIVE 08/03/2023 1431   HGBUR NEGATIVE 03/01/2024 1800   HGBUR negative 10/29/2009 1406   BILIRUBINUR NEGATIVE 03/01/2024 1800   BILIRUBINUR neg 01/08/2016 1407   KETONESUR >=80 (A) 03/01/2024 1800   PROTEINUR 30 (A) 03/01/2024 1800   UROBILINOGEN 0.2 08/03/2023 1431   NITRITE NEGATIVE 03/01/2024 1800   LEUKOCYTESUR NEGATIVE 03/01/2024 1800   Sepsis Labs: Invalid input(s): "PROCALCITONIN", "LACTICIDVEN"  Microbiology: Recent Results (from the past 240 hours)  Blood Culture (routine x 2)      Status: None (Preliminary result)   Collection Time: 03/01/24  2:29 PM   Specimen: BLOOD LEFT FOREARM  Result Value Ref Range Status   Specimen Description   Final    BLOOD LEFT FOREARM Performed at [facility], [address] [place], [place] [address]    Special Requests   Final    BOTTLES DRAWN AEROBIC AND ANAEROBIC Blood Culture adequate volume Performed at [facility], [address] [place], [place] [address]    Culture   Final    NO GROWTH 3 DAYS Performed at [facility], [address], [place], [place] [address]    Report Status PENDING  Incomplete  Culture, blood (Routine X 2) w Reflex to ID Panel     Status: None (Preliminary result)   Collection Time: 03/03/24  8:23 PM   Specimen: BLOOD RIGHT FOREARM  Result Value Ref Range Status   Specimen Description BLOOD RIGHT FOREARM  Final   Special Requests   Final    BOTTLES DRAWN AEROBIC AND ANAEROBIC Blood Culture adequate volume   Culture   Final    NO GROWTH < 12 HOURS Performed at [facility], [address], [place], [place] [address]    Report Status PENDING  Incomplete  Culture, blood (Routine X 2) w Reflex to ID Panel     Status: None (Preliminary result)   Collection Time: 03/03/24  8:28 PM   Specimen: BLOOD LEFT FOREARM  Result Value Ref Range Status   Specimen Description BLOOD LEFT FOREARM  Final   Special Requests   Final    BOTTLES DRAWN AEROBIC AND ANAEROBIC Blood Culture adequate volume   Culture   Final    NO GROWTH < 12 HOURS Performed at [facility], [address] [place], [place] [address]    Report Status PENDING  Incomplete    Radiology Studies: IR GJ Tube Change Result Date: 03/04/2024 CLINICAL DATA:  leaking around long-term indwelling gastrojejunostomy catheter EXAM: GASTRO JEJUNAL CATHETER REPLACEMENT UNDER FLUOROSCOPY ANESTHESIA/SEDATION: viscous lidocaine  topical MEDICATIONS: No periprocedural antibiotics were indicated CONTRAST:  25mL OMNIPAQUE   IOHEXOL  300 MG/ML SOLN, 25mL OMNIPAQUE  IOHEXOL  300  MG/ML SOLN PROCEDURE: GJ tube and surrounding skin were prepped and draped in usual sterile fashion. Small injection of contrast demonstrates good position of the GJ tube, tip beyond the ligament of Treitz. The retention balloon was deflated. 25 mL of clear red tinged fluid returned from the balloon rated for 10 mL. The catheter was exchanged over an angled Glidewire for a new 24 [place] dual lumen device. Jejunal tip position beyond ligament of Treitz. Gastrostomy lumen maintained at the stomach, with the balloon inflated with 10 mL sterile saline within the stomach. External bumper applied. FLUOROSCOPY TIME:  Radiation Exposure Index (as provided by the fluoroscopic device): 11 mGy air Kerma COMPLICATIONS: None immediate FINDINGS: The existing GJ tube was malpositioned with the balloon in the duodenal bulb, over inflated with 25 mL, possibly relating to outflow obstruction related to the symptoms. Catheter exchanged for a new 24 [place] dual lumen device, positioned appropriately as above. Okay for routine use. IMPRESSION: Successful replacement of gastrojejunostomy catheter. Electronically Signed   By: Nicoletta Barrier M.D.   On: 03/04/2024 07:54   CT Chest W Contrast Result Date: 03/03/2024 CLINICAL DATA:  Pneumonia. EXAM: CT CHEST WITH CONTRAST TECHNIQUE: Multidetector CT imaging of the chest was performed during intravenous contrast administration. RADIATION DOSE REDUCTION: This exam was performed according to the departmental dose-optimization program which includes automated exposure control, adjustment of the mA and/or kV according to patient size and/or use of iterative reconstruction technique. CONTRAST:  50mL OMNIPAQUE  IOHEXOL  350 MG/ML SOLN COMPARISON:  Chest CT dated 02/05/2024. FINDINGS: Evaluation is very limited due to respiratory motion as well as streak artifact caused by patient's arms and spinal hardware. Cardiovascular: There is no cardiomegaly or  pericardial effusion. The thoracic aorta is unremarkable. The origins of the great vessels of the aortic arch and the central pulmonary arteries appear patent. Mediastinum/Nodes: No hilar or mediastinal adenopathy. The esophagus is grossly unremarkable. No mediastinal fluid collection. Lungs/Pleura: Small loculated appearing right pleural effusion. Rounded consolidative changes in the right lower lobe likely round atelectasis. Pneumonia or mass is not excluded. Attention on follow-up imaging recommended. Upper Abdomen: Partially visualized percutaneous gastrostomy. Musculoskeletal: Osteopenia with scoliosis and degenerative changes. Spinal fusion hardware. No acute osseous pathology. IMPRESSION: 1. Small loculated appearing right pleural effusion. 2. Probably rounded atelectasis in the right lower lobe similar to prior CT. Attention on follow-up imaging recommended. Electronically Signed   By: Angus Bark M.D.   On: 03/03/2024 17:56      Joli Koob T. Wynston Romey [facility]  If 7PM-7AM, please contact night-coverage [email or web address] 03/04/2024, 11:26 AM

## 2024-03-04 NOTE — Progress Notes (Incomplete)
 03/04/2024 Reviewed case and imaging: effusion and rounded atelectasis is stable from October in appearance.  Too small for intervention nor likely the culprit for current symptoms which seems to be recurrent aspiration.  Can try to collect sputum if able.

## 2024-03-04 NOTE — ED Notes (Signed)
CBG 114 

## 2024-03-05 DIAGNOSIS — J690 Pneumonitis due to inhalation of food and vomit: Secondary | ICD-10-CM | POA: Diagnosis not present

## 2024-03-05 DIAGNOSIS — R509 Fever, unspecified: Secondary | ICD-10-CM | POA: Diagnosis not present

## 2024-03-05 DIAGNOSIS — J189 Pneumonia, unspecified organism: Secondary | ICD-10-CM | POA: Diagnosis not present

## 2024-03-05 DIAGNOSIS — A419 Sepsis, unspecified organism: Secondary | ICD-10-CM | POA: Diagnosis not present

## 2024-03-05 LAB — COMPREHENSIVE METABOLIC PANEL WITH GFR
ALT: 20 U/L (ref 0–44)
AST: 19 U/L (ref 15–41)
Albumin: 3 g/dL — ABNORMAL LOW (ref 3.5–5.0)
Alkaline Phosphatase: 44 U/L (ref 38–126)
Anion gap: 7 (ref 5–15)
BUN: 6 mg/dL (ref 6–20)
CO2: 28 mmol/L (ref 22–32)
Calcium: 8.3 mg/dL — ABNORMAL LOW (ref 8.9–10.3)
Chloride: 104 mmol/L (ref 98–111)
Creatinine, Ser: <0.3 mg/dL — ABNORMAL LOW (ref 0.61–1.24)
Glucose, Bld: 145 mg/dL — ABNORMAL HIGH (ref 70–99)
Potassium: 3.4 mmol/L — ABNORMAL LOW (ref 3.5–5.1)
Sodium: 139 mmol/L (ref 135–145)
Total Bilirubin: 0.6 mg/dL (ref 0.0–1.2)
Total Protein: 5.3 g/dL — ABNORMAL LOW (ref 6.5–8.1)

## 2024-03-05 LAB — GLUCOSE, CAPILLARY
Glucose-Capillary: 157 mg/dL — ABNORMAL HIGH (ref 70–99)
Glucose-Capillary: 148 mg/dL — ABNORMAL HIGH (ref 70–99)
Glucose-Capillary: 118 mg/dL — ABNORMAL HIGH (ref 70–99)
Glucose-Capillary: 113 mg/dL — ABNORMAL HIGH (ref 70–99)

## 2024-03-05 LAB — CBC
HCT: 34.7 % — ABNORMAL LOW (ref 39.0–52.0)
Hemoglobin: 11.9 g/dL — ABNORMAL LOW (ref 13.0–17.0)
MCH: 32 pg (ref 26.0–34.0)
MCHC: 34.3 g/dL (ref 30.0–36.0)
MCV: 93.3 fL (ref 80.0–100.0)
Platelets: 168 10*3/uL (ref 150–400)
RBC: 3.72 MIL/uL — ABNORMAL LOW (ref 4.22–5.81)
RDW: 12.6 % (ref 11.5–15.5)
WBC: 3.8 10*3/uL — ABNORMAL LOW (ref 4.0–10.5)
nRBC: 0 % (ref 0.0–0.2)

## 2024-03-05 LAB — PHOSPHORUS: Phosphorus: 2.9 mg/dL (ref 2.5–4.6)

## 2024-03-05 LAB — MAGNESIUM: Magnesium: 2 mg/dL (ref 1.7–2.4)

## 2024-03-05 MED ORDER — LEVOFLOXACIN IN D5W 750 MG/150ML IV SOLN
750.0000 mg | INTRAVENOUS | Status: DC
  Administered 2024-03-05 – 2024-03-06 (×2): 750 mg via INTRAVENOUS
  Filled 2024-03-05 (×2): qty 150

## 2024-03-05 MED ORDER — LOPERAMIDE HCL 1 MG/7.5ML PO SUSP
2.0000 mg | ORAL | Status: DC | PRN
  Administered 2024-03-05: 2 mg
  Filled 2024-03-05 (×2): qty 15

## 2024-03-05 MED ORDER — IPRATROPIUM-ALBUTEROL 0.5-2.5 (3) MG/3ML IN SOLN
3.0000 mL | Freq: Three times a day (TID) | RESPIRATORY_TRACT | Status: DC
  Administered 2024-03-05 – 2024-03-06 (×2): 3 mL via RESPIRATORY_TRACT
  Filled 2024-03-05 (×2): qty 3

## 2024-03-05 MED ORDER — POTASSIUM CHLORIDE 20 MEQ PO PACK
40.0000 meq | PACK | ORAL | Status: AC
  Administered 2024-03-05 (×2): 40 meq
  Filled 2024-03-05 (×2): qty 2

## 2024-03-05 NOTE — Progress Notes (Signed)
 PROGRESS NOTE  [name] ION:[id number] DOB: 1986/01/17   PCP: Neda Balk, MD  Patient is from: ALF  DOA: 03/03/2024 LOS: 2  Chief complaints Chief Complaint  Patient presents with   Fever   GTube infection     Brief Narrative / Interim history: 38 year old F with PMH of cerebral palsy with spastic quadriplegia, esophageal dysphagia on TF via J-tube, posterior left urostomy tube placement, GERD and recurrent RLL pneumonia returning with fever, productive cough and gurgling for about 3 days, and admitted with sepsis due to recurrent RLL pneumonia.    In ED, febrile to 100.7 with mild tachycardia and tachypnea.  Hypoglycemic to 54.  K3.3.  WBC 5.7.  IR replaced GJ tube.  CT chest showed small loculated appearing right pleural effusion with possible rounded atelectasis in RLL similar to prior CT. PCCM consulted.  Cultures ordered.  Patient was started on IV meropenem  and vancomycin , and admitted.  Patient improved.  PCCM did not feel pleural fluid is large enough to tap safely.  MRSA PCR screen negative.  Blood cultures negative.  After discussion with ID and patient's mother, de-escalated antibiotics to p.o. Levaquin  for total of 2 weeks.   Likely discharge home on 5/5 if stable  Subjective: Seen and examined earlier this morning.  Patient is nonverbal.  Father at bedside in the morning.  Reports diarrhea.  Tends to get diarrhea with antibiotics.  Also talked to patient's mother at bedside later in the morning.  Discussed about PCCM and ID recommendation.  [name] stated that he did well with Levaquin  in the past.   Objective: Vitals:   03/05/24 0722 03/05/24 0852 03/05/24 0857 03/05/24 1135  BP: 110/71 98/69  (!) 113/95  Pulse: 78 75  87  Resp: 20 20  20   Temp: 98.4 F (36.9 C) 98.4 F (36.9 C)  98.8 F (37.1 C)  TempSrc: Axillary Temporal  Axillary  SpO2: 99% 97% 98% 100%  Weight:        Examination:  GENERAL: No apparent distress.  Frail. HEENT: MMM.   Vision and hearing grossly intact.  NECK: Supple.  No apparent JVD.  RESP:  No IWOB.  Fair aeration bilaterally. CVS:  RRR. Heart sounds normal.  ABD/GI/GU: BS+. Abd soft.  GJ tube in place. MSK/EXT: Spastic quadriplegia with muscle mass and subcu fat loss. SKIN: no apparent skin lesion or wound NEURO: Awake.  Nonverbal but interactive.  Smiles.  Spastic quadriplegia PSYCH: Calm. Normal affect.   Consultants:  PCCM  Procedures: 5/2-GJ tube exchange  Microbiology summarized: Blood cultures NGTD Sputum culture-not able to provide sample. MRSA PCR screen negative  Assessment and plan: Sepsis secondary to right lower lobe pneumonia, POA: Concern for aspiration pneumonia/pneumonitis.  Recurrent issue.  Febrile to 100.7 with tachycardia and tachypnea.  CT chest as above.  No leukocytosis.  Pro-Cal negative. -Per PCCM, pleural fluid not large enough to tap safely. -Discussed with ID, recommended 2 weeks of antibiotics either with Levaquin  or Augmentin  -Discontinue vancomycin .  MRSA PCR screen negative. -Changed meropenem  to Levaquin .  Per mother, did well with Levaquin  in the past. -Aspiration precaution and pulmonary toilet. -Likely discharge home on 5/5  Cerebral palsy with spastic quadriplegia: Stable.  Total care but able to maneuver his electric wheelchair. -Continue home meds -Supportive care  Esophageal dysphagia: GJ tube replaced on 5/2 -Continue tube feed  Mood disorder/anxiety/depression - Continue home meds   Level 2 hypoglycemia: BMP glucose 40 on admission.  Resolved. -Continue tube feed -Monitor CBG every 12  hours. -Dietitian consulted   High anion gap metabolic acidosis: Resolved.   Hypokalemia -Monitor replenish as appropriate  Diarrhea: Likely due to tube feed and antibiotics.  Abdominal exam benign - Imodium - Continue probiotics  Inadequate oral intake Body mass index is 17.97 kg/m. Nutrition Problem: Inadequate oral intake Etiology: dysphagia,  chronic illness Signs/Symptoms: NPO status Interventions: Tube feeding, Refer to RD note for recommendations   DVT prophylaxis:  enoxaparin  (LOVENOX ) injection 30 mg Start: 03/03/24 2200  Code Status: DNR Family Communication: Updated patient's father and mother at bedside. Level of care: Progressive Status is: Inpatient Remains inpatient appropriate because: Aspiration pneumonia   Final disposition: Home on 5/5   55 minutes with more than 50% spent in reviewing records, counseling patient/family and coordinating care.   Sch Meds:  Scheduled Meds:  acidophilus  1 capsule Oral TID WC   dantrolene   100 mg Per Tube TID   divalproex   250 mg Oral Q24H   divalproex   250 mg Oral Q24H   divalproex   375 mg Oral Daily   doxepin   25 mg Per Tube QHS   enoxaparin  (LOVENOX ) injection  30 mg Subcutaneous Q24H   escitalopram   20 mg Per Tube Daily   feeding supplement ([employer] STANDARD ENT 1.4)  1,000 mL Per Tube Daily   free water  200 mL Per Tube Q6H   guaiFENesin   15 mL Per Tube BID   ipratropium-albuterol   3 mL Nebulization Q6H   lamoTRIgine   50 mg Per Tube BID   LORazepam   1 mg Per Tube QHS   multivitamin with minerals  1 tablet Per Tube Daily   OLANZapine   10 mg Per Tube QHS   OLANZapine   5 mg Per Tube QPM   oxyBUTYnin   7.5 mg Per Tube QHS   potassium chloride   40 mEq Per Tube Q4H   saccharomyces boulardii  250 mg Per Tube QPM   sodium chloride  HYPERTONIC  4 mL Nebulization BID   sucralfate   1 g Per Tube Daily   thiamine  100 mg Per Tube Daily   traZODone   150 mg Per Tube QHS   Continuous Infusions:  meropenem  (MERREM ) IV 1 g (03/05/24 0510)   PRN Meds:.acetaminophen , albuterol , diazepam , guaiFENesin -dextromethorphan , polyethylene glycol, prochlorperazine  Antimicrobials: Anti-infectives (From admission, onward)    Start     Dose/Rate Route Frequency Ordered Stop   03/04/24 1000  vancomycin  (VANCOREADY) IVPB 500 mg/100 mL  Status:  Discontinued        500 mg 100  mL/hr over 60 Minutes Intravenous Every 12 hours 03/03/24 2031 03/05/24 1051   03/03/24 2200  meropenem  (MERREM ) 1 g in sodium chloride  0.9 % 100 mL IVPB        1 g 200 mL/hr over 30 Minutes Intravenous Every 8 hours 03/03/24 1936     03/03/24 1945  vancomycin  (VANCOREADY) IVPB 750 mg/150 mL        750 mg 150 mL/hr over 60 Minutes Intravenous  Once 03/03/24 1936 03/03/24 2141   03/03/24 1845  levofloxacin  (LEVAQUIN ) IVPB 750 mg  Status:  Discontinued        750 mg 100 mL/hr over 90 Minutes Intravenous  Once 03/03/24 1831 03/03/24 2020        I have personally reviewed the following labs and images: CBC: Recent Labs  Lab 03/01/24 1510 03/03/24 1115 03/04/24 0513 03/05/24 0426  WBC 8.4 5.7 4.9 3.8*  NEUTROABS 5.8 3.8  --   --   HGB 14.3 15.0 12.4* 11.9*  HCT  41.7 46.1 37.8* 34.7*  MCV 92.3 95.6 95.2 93.3  PLT 220 209 191 168   BMP &GFR Recent Labs  Lab 03/01/24 1510 03/03/24 1115 03/04/24 0513 03/05/24 0426  NA 135 137 139 139  K 3.9 3.3* 3.3* 3.4*  CL 97* 98 105 104  CO2 22 17* 21* 28  GLUCOSE 88 40* 63* 145*  BUN 6 6 <5* 6  CREATININE <0.30* 0.70 0.59* <0.30*  CALCIUM  9.6 9.6 8.5* 8.3*  MG  --   --  1.9 2.0  PHOS  --   --  3.5 2.9   CrCl cannot be calculated (This lab value cannot be used to calculate CrCl because it is not a number: <0.30). Liver & Pancreas: Recent Labs  Lab 03/01/24 1510 03/03/24 1115 03/04/24 0513 03/05/24 0426  AST 19 28 23 19   ALT 14 17 17 20   ALKPHOS 74 66 46 44  BILITOT 0.4 1.6* 1.4* 0.6  PROT 7.4 7.4 5.8* 5.3*  ALBUMIN 4.5 4.3 3.2* 3.0*   No results for input(s): "LIPASE", "AMYLASE" in the last 168 hours. No results for input(s): "AMMONIA" in the last 168 hours. Diabetic: No results for input(s): "HGBA1C" in the last 72 hours. Recent Labs  Lab 03/04/24 1208 03/04/24 1621 03/04/24 2006 03/05/24 0205 03/05/24 0724  GLUCAP 114* 136* 150* 157* 148*   Cardiac Enzymes: No results for input(s): "CKTOTAL", "CKMB",  "CKMBINDEX", "TROPONINI" in the last 168 hours. No results for input(s): "PROBNP" in the last 8760 hours. Coagulation Profile: Recent Labs  Lab 03/01/24 1510  INR 1.0   Thyroid  Function Tests: No results for input(s): "TSH", "T4TOTAL", "FREET4", "T3FREE", "THYROIDAB" in the last 72 hours. Lipid Profile: No results for input(s): "CHOL", "HDL", "LDLCALC", "TRIG", "CHOLHDL", "LDLDIRECT" in the last 72 hours. Anemia Panel: No results for input(s): "VITAMINB12", "FOLATE", "FERRITIN", "TIBC", "IRON", "RETICCTPCT" in the last 72 hours. Urine analysis:    Component Value Date/Time   COLORURINE YELLOW 03/01/2024 1800   APPEARANCEUR CLEAR 03/01/2024 1800   LABSPEC 1.020 03/01/2024 1800   PHURINE 7.0 03/01/2024 1800   GLUCOSEU NEGATIVE 03/01/2024 1800   GLUCOSEU NEGATIVE 08/03/2023 1431   HGBUR NEGATIVE 03/01/2024 1800   HGBUR negative 10/29/2009 1406   BILIRUBINUR NEGATIVE 03/01/2024 1800   BILIRUBINUR neg 01/08/2016 1407   KETONESUR >=80 (A) 03/01/2024 1800   PROTEINUR 30 (A) 03/01/2024 1800   UROBILINOGEN 0.2 08/03/2023 1431   NITRITE NEGATIVE 03/01/2024 1800   LEUKOCYTESUR NEGATIVE 03/01/2024 1800   Sepsis Labs: Invalid input(s): "PROCALCITONIN", "LACTICIDVEN"  Microbiology: Recent Results (from the past 240 hours)  Blood Culture (routine x 2)     Status: None (Preliminary result)   Collection Time: 03/01/24  2:29 PM   Specimen: BLOOD LEFT FOREARM  Result Value Ref Range Status   Specimen Description   Final    BLOOD LEFT FOREARM Performed at [facility], [name], [place], [place] [address]    Special Requests   Final    BOTTLES DRAWN AEROBIC AND ANAEROBIC Blood Culture adequate volume Performed at [facility], [address] [place], [place] [address]    Culture   Final    NO GROWTH 4 DAYS Performed at [facility], [address], [place], [place] [address]    Report Status PENDING  Incomplete  Culture, blood (Routine X 2) w Reflex to  ID Panel     Status: None (Preliminary result)   Collection Time: 03/03/24  8:23 PM   Specimen: BLOOD RIGHT FOREARM  Result Value Ref Range Status   Specimen Description BLOOD RIGHT FOREARM  Final   Special Requests   Final    BOTTLES DRAWN AEROBIC AND ANAEROBIC Blood Culture adequate volume   Culture   Final    NO GROWTH 2 DAYS Performed at [facility], [address] [place], [place] [address]    Report Status PENDING  Incomplete  Culture, blood (Routine X 2) w Reflex to ID Panel     Status: None (Preliminary result)   Collection Time: 03/03/24  8:28 PM   Specimen: BLOOD LEFT FOREARM  Result Value Ref Range Status   Specimen Description BLOOD LEFT FOREARM  Final   Special Requests   Final    BOTTLES DRAWN AEROBIC AND ANAEROBIC Blood Culture adequate volume   Culture   Final    NO GROWTH 2 DAYS Performed at [facility], [address] [place], [place] [address]    Report Status PENDING  Incomplete  MRSA Next Gen by PCR, Nasal     Status: None   Collection Time: 03/04/24  4:41 PM   Specimen: Nasal Mucosa; Nasal Swab  Result Value Ref Range Status   MRSA by PCR Next Gen NOT DETECTED NOT DETECTED Final    Comment: (NOTE) The GeneXpert MRSA Assay (FDA approved for NASAL specimens only), is one component of a comprehensive MRSA colonization surveillance program. It is not intended to diagnose MRSA infection nor to guide or monitor treatment for MRSA infections. Test performance is not FDA approved in patients less than 44 years old. Performed at [facility], [address] [place], [place] [address]     Radiology Studies: No results found.     Jacobs Golab T. Rubye Strohmeyer [facility]  If 7PM-7AM, please contact night-coverage [email or web address] 03/05/2024, 12:06 PM

## 2024-03-05 NOTE — Plan of Care (Signed)

## 2024-03-05 NOTE — Progress Notes (Signed)
 Pharmacy Antibiotic Note  [name] is a 38 y.o. male with history of cerebral palsy and spastic quadriplegia, s/p GJ tube placement, and post left urostomy tube placement who was admitted with recurrent aspiration pnuemonia after completing antibiotic course. Pharmacy has been consulted for levaquin  dosing.  Patient with a history of cerebral palsy and spastic quadriplegia, s/p GJ tube placement, post left urostomy tube placement, recurrent right lower lobe pneumonia, GERD.  ID recommended switching from meropenem  to levaquin  to complete total of 14d duration.  SCr < 0.3 -- electric power chair bound No leukocytosis, fever resolved.  Plan: Discontinue meropenem  Start levofloxacin  750mg  IV Q24H (EOT 5/16) Monitor WBC, renal function, and cultures  Weight: 41.7 kg (92 lb)  Temp (24hrs), Avg:98.4 F (36.9 C), Min:97.5 F (36.4 C), Max:99.2 F (37.3 C)  Recent Labs  Lab 03/01/24 1510 03/03/24 1115 03/03/24 2200 03/04/24 0015 03/04/24 0513 03/05/24 0426  WBC 8.4 5.7  --   --  4.9 3.8*  CREATININE <0.30* 0.70  --   --  0.59* <0.30*  LATICACIDVEN 0.7  --  1.0 0.9  --   --     CrCl cannot be calculated (This lab value cannot be used to calculate CrCl because it is not a number: <0.30).    Allergies  Allergen Reactions   Ambien  [Zolpidem  Tartrate] Nausea Only   Antihistamines, Chlorpheniramine-Type     Other reaction(s): Other (See Comments) Other Reaction: agitation   Augmentin  [Amoxicillin -Pot Clavulanate] Diarrhea   Cefdinir  Diarrhea    Not tolerate well and cause diarrhea   Codeine  Other (See Comments)    Makes patient too active after a few days.   Zolpidem      Other Reaction(s): GI Intolerance   Baclofen  Anxiety and Swelling    anxiety   Metoclopramide  Other (See Comments) and Swelling    Delusion, emotionality  Other Reaction(s): Mental Status Changes   Pheniramine Rash    Other reaction(s): Other (See Comments)  Other Reaction: agitation  Other  Reaction(s): Other (See Comments)  Other reaction(s): Other (See Comments), Other Reaction: agitation   Sulfa Antibiotics Rash   Sulfonamide Derivatives Rash   Microbiology results: 5/2 BCX: NGTD2 5/3 MRSA swab: negative Sputum to be collected  Abelina Abide, PharmD PGY1 Pharmacy Resident 03/05/2024 12:34 PM

## 2024-03-06 ENCOUNTER — Other Ambulatory Visit (HOSPITAL_COMMUNITY): Payer: Self-pay

## 2024-03-06 DIAGNOSIS — R509 Fever, unspecified: Secondary | ICD-10-CM | POA: Diagnosis not present

## 2024-03-06 DIAGNOSIS — E43 Unspecified severe protein-calorie malnutrition: Secondary | ICD-10-CM

## 2024-03-06 DIAGNOSIS — F418 Other specified anxiety disorders: Secondary | ICD-10-CM

## 2024-03-06 DIAGNOSIS — J690 Pneumonitis due to inhalation of food and vomit: Secondary | ICD-10-CM | POA: Diagnosis not present

## 2024-03-06 LAB — CBC
HCT: 38.3 % — ABNORMAL LOW (ref 39.0–52.0)
Hemoglobin: 12.6 g/dL — ABNORMAL LOW (ref 13.0–17.0)
MCH: 31.7 pg (ref 26.0–34.0)
MCHC: 32.9 g/dL (ref 30.0–36.0)
MCV: 96.2 fL (ref 80.0–100.0)
Platelets: 157 10*3/uL (ref 150–400)
RBC: 3.98 MIL/uL — ABNORMAL LOW (ref 4.22–5.81)
RDW: 12.4 % (ref 11.5–15.5)
WBC: 3.5 10*3/uL — ABNORMAL LOW (ref 4.0–10.5)
nRBC: 0 % (ref 0.0–0.2)

## 2024-03-06 LAB — BASIC METABOLIC PANEL WITH GFR
Anion gap: 8 (ref 5–15)
BUN: 6 mg/dL (ref 6–20)
CO2: 24 mmol/L (ref 22–32)
Calcium: 8.7 mg/dL — ABNORMAL LOW (ref 8.9–10.3)
Chloride: 103 mmol/L (ref 98–111)
Creatinine, Ser: <0.3 mg/dL — ABNORMAL LOW (ref 0.61–1.24)
Glucose, Bld: 96 mg/dL (ref 70–99)
Potassium: 4.6 mmol/L (ref 3.5–5.1)
Sodium: 135 mmol/L (ref 135–145)

## 2024-03-06 LAB — CULTURE, BLOOD (ROUTINE X 2)
Culture: NO GROWTH
Special Requests: ADEQUATE

## 2024-03-06 LAB — MAGNESIUM: Magnesium: 2 mg/dL (ref 1.7–2.4)

## 2024-03-06 LAB — GLUCOSE, CAPILLARY: Glucose-Capillary: 101 mg/dL — ABNORMAL HIGH (ref 70–99)

## 2024-03-06 LAB — PHOSPHORUS: Phosphorus: 3.6 mg/dL (ref 2.5–4.6)

## 2024-03-06 MED ORDER — LOPERAMIDE HCL 1 MG/7.5ML PO SOLN: 2.0000 mg | ORAL | Status: AC | PRN

## 2024-03-06 MED ORDER — LEVOFLOXACIN 25 MG/ML PO SOLN
750.0000 mg | Freq: Every day | ORAL | 0 refills | Status: AC
  Filled 2024-03-06: qty 100, 3d supply, fill #0

## 2024-03-06 NOTE — Discharge Summary (Signed)
 Physician Discharge Summary  [name] QIO:[id number] DOB: 04/09/1986 DOA: 03/03/2024  PCP: Neda Balk, MD  Admit date: 03/03/2024 Discharge date: 03/06/24  Admitted From: ALF Disposition: Home Recommendations for Outpatient Follow-up:  Outpatient follow-up with PCP Check CMP and CBC at follow-up Please follow up on the following pending results: None  Home Health: No need identified Equipment/Devices: Patient has appropriate DME  Discharge Condition: Stable CODE STATUS: DNR-Limited  Follow-up Information     Neda Balk, MD. Schedule an appointment as soon as possible for a visit in 1 week(s).   Specialty: Family Medicine Contact information: [address] [place] [address] [phone]                 Hospital course 38 year old M with PMH of cerebral palsy with spastic quadriplegia, esophageal dysphagia on TF via J-tube, posterior left urostomy tube placement, GERD and recurrent RLL pneumonia returning with fever, productive cough and gurgling for about 3 days, and admitted with sepsis due to recurrent RLL pneumonia.     In ED, febrile to 100.7 with mild tachycardia and tachypnea.  Hypoglycemic to 54.  K3.3.  WBC 5.7.  IR replaced GJ tube.  CT chest showed small loculated appearing right pleural effusion with possible rounded atelectasis in RLL similar to prior CT. PCCM consulted.  Cultures ordered.  Patient was started on IV meropenem  and vancomycin , and admitted.   Patient improved back to his baseline.  PCCM did not feel pleural fluid is large enough to tap safely.  MRSA PCR screen negative.  Blood cultures negative.  After discussion with ID (Dr. Donnette Gal), antibiotic de-escalated to p.o. Levaquin  for a total of 2 weeks.    See individual problem list below for more.   Problems addressed during this hospitalization Sepsis secondary to right lower lobe pneumonia, POA: Concern for recurrent aspiration pneumonia/pneumonitis.   Febrile to 100.7 with tachycardia and tachypnea.  CT chest as above.  No leukocytosis.  Pro-Cal negative. -Per PCCM, pleural fluid not large enough to tap safely. -Started on vancomycin  and meropenem .  MRSA PCR screen negative.  Vancomycin  discontinued. -Discussed with ID, recommended either Levaquin  or Augmentin  to complete total of 14 days -Aspiration precaution and pulmonary toilet.   Cerebral palsy with spastic quadriplegia: Stable.  Total care but able to maneuver his electric wheelchair. -Continue home meds -Supportive care   Esophageal dysphagia: GJ tube replaced by IR on 5/2 -Continue tube feed   Mood disorder/anxiety/depression -Continue home meds   Level 2 hypoglycemia: BMP glucose 40 on admission.  Resolved. -Continue tube feed   High anion gap metabolic acidosis: Resolved.   Hypokalemia: Resolved   Diarrhea: Likely due to tube feed and antibiotics.  Abdominal exam benign - Imodium and probiotics   Inadequate oral intake Nutrition Problem: Inadequate oral intake Etiology: dysphagia, chronic illness Signs/Symptoms: NPO status Interventions: Tube feeding, Refer to RD note for recommendations     Time spent 35 minutes  Vital signs Vitals:   03/06/24 0421 03/06/24 0759 03/06/24 0800 03/06/24 1212  BP: (!) 86/60 126/88 126/88 129/89  Pulse: (!) 53 87 89   Temp: 98 F (36.7 C) 98.8 F (37.1 C)  98.7 F (37.1 C)  Resp: 20 (!) 25 (!) 25 20  Weight:      SpO2: 97% 96% 96%   TempSrc: Axillary Oral  Oral     Discharge exam  GENERAL: No apparent distress.  Frail. HEENT:  Vision and hearing grossly intact.  NECK: Supple.  No apparent JVD.  RESP:  No IWOB.  Fair aeration bilaterally. CVS:  RRR. Heart sounds normal.  ABD/GI/GU: BS+. Abd soft.  GJ tube in place. MSK/EXT: Spastic quadriplegia with muscle mass and subcu fat loss. SKIN: no apparent skin lesion or wound NEURO: Awake.  Nonverbal but interactive and happy.  Smiles.  Spastic quadriplegia PSYCH: Calm.  Normal affect.   Discharge Instructions Discharge Instructions     Discharge instructions   Complete by: As directed    It has been a pleasure taking care of you!  You were hospitalized due to recurrent aspiration pneumonia of right lung for which you have been started on antibiotics.  We are discharging you on more antibiotics to complete treatment course.  It is very important that you complete the whole course of antibiotics.  Follow-up with your primary care doctor in 1 to 2 weeks or sooner if needed.   Take care,   Increase activity slowly   Complete by: As directed       Allergies as of 03/06/2024       Reactions   Ambien  [zolpidem  Tartrate] Nausea Only   Antihistamines, Chlorpheniramine-type    Other reaction(s): Other (See Comments) Other Reaction: agitation   Augmentin  [amoxicillin -pot Clavulanate] Diarrhea   Cefdinir  Diarrhea   Not tolerate well and cause diarrhea   Codeine  Other (See Comments)   Makes patient too active after a few days.   Zolpidem     Other Reaction(s): GI Intolerance   Baclofen  Anxiety, Swelling   anxiety   Metoclopramide  Other (See Comments), Swelling   Delusion, emotionality Other Reaction(s): Mental Status Changes   Pheniramine Rash   Other reaction(s): Other (See Comments) Other Reaction: agitation Other Reaction(s): Other (See Comments) Other reaction(s): Other (See Comments), Other Reaction: agitation   Sulfa Antibiotics Rash   Sulfonamide Derivatives Rash        Medication List     STOP taking these medications    HYDROcodone  bit-homatropine 5-1.5 MG/5ML syrup Commonly known as: Hydromet   levofloxacin  750 MG tablet Commonly known as: Levaquin  Replaced by: levofloxacin  25 MG/ML solution   omeprazole  20 MG capsule Commonly known as: PRILOSEC       TAKE these medications    albuterol  (2.5 MG/3ML) 0.083% nebulizer solution Commonly known as: PROVENTIL  Take 3 mLs (2.5 mg total) by nebulization every 4 (four) hours as  needed for wheezing or shortness of breath.   clotrimazole -betamethasone  cream Commonly known as: Lotrisone  Apply 1 Application topically 2 (two) times daily as needed.   dantrolene  100 MG capsule Commonly known as: DANTRIUM  Take 1 capsule (100 mg total) by mouth 3 (three) times daily. What changed: how to take this   Depakote  Sprinkles 125 MG capsule Generic drug: divalproex  Take 250-375 mg by mouth See admin instructions. Take 375mg  (3 capsules) by mouth at 0900, 250mg  (2 capsules) at 2030, and 250mg  (2 capsules) at 2130. G tube   Dextromethorphan -guaiFENesin  5-100 MG/5ML Liqd Give 20 mLs by tube every 6 (six) hours as needed (Cough).   diazepam  5 MG tablet Commonly known as: VALIUM  Place 5 mg into feeding tube daily as needed for anxiety.   doxepin  25 MG capsule Commonly known as: SINEQUAN  Place 25 mg into feeding tube at bedtime.   escitalopram  5 MG/5ML solution Commonly known as: LEXAPRO  Place 20 mLs into feeding tube daily.   feeding supplement (KATE FARMS STANDARD 1.4) Liqd liquid Take 325 mLs by mouth as directed. 3 carton over 16 hrs   FLORASTOR PO Give 1 capsule by  tube every evening.   lamoTRIgine  25 MG tablet Commonly known as: LAMICTAL  TAKE 2 TABLETS BY MOUTH 2 TIMES DAILY. What changed: how to take this   levofloxacin  25 MG/ML solution Commonly known as: LEVAQUIN  Place 30 mLs (750 mg total) into feeding tube daily for 11 days. Replaces: levofloxacin  750 MG tablet   loperamide HCl 1 MG/7.5ML solution Commonly known as: Imodium A-D Place 15 mLs (2 mg total) into feeding tube as needed for diarrhea or loose stools.   LORazepam  1 MG tablet Commonly known as: ATIVAN  Place 1 mg into feeding tube at bedtime.   OLANZapine  5 MG tablet Commonly known as: ZYPREXA  Place 5 mg into feeding tube daily. @1700    OLANZapine  10 MG tablet Commonly known as: ZYPREXA  Place 10 mg into feeding tube at bedtime.   OVER THE COUNTER MEDICATION Give 1 capsule by tube 2  (two) times daily. Muscle Calm Formula   OVER THE COUNTER MEDICATION Give 1 capsule by tube at bedtime. CBD/Melaonin   oxyBUTYnin  5 MG/5ML solution Commonly known as: DITROPAN  Place 7.5 mg into feeding tube at bedtime.   sucralfate  1 g tablet Commonly known as: CARAFATE  Take 1 tablet (1 g total) by mouth daily. TAKE 1 TABLET BY MOUTH 4 TIMES DAILY WITH MEALS AND AT BEDTIME What changed:  how to take this additional instructions   traZODone  150 MG tablet Commonly known as: DESYREL  Place 150 mg into feeding tube at bedtime.        Consultations: Pulmonology Infectious disease  Procedures/Studies:   IR GJ Tube Change Result Date: 03/04/2024 CLINICAL DATA:  leaking around long-term indwelling gastrojejunostomy catheter EXAM: GASTRO JEJUNAL CATHETER REPLACEMENT UNDER FLUOROSCOPY ANESTHESIA/SEDATION: viscous lidocaine  topical MEDICATIONS: No periprocedural antibiotics were indicated CONTRAST:  25mL OMNIPAQUE  IOHEXOL  300 MG/ML SOLN, 25mL OMNIPAQUE  IOHEXOL  300 MG/ML SOLN PROCEDURE: GJ tube and surrounding skin were prepped and draped in usual sterile fashion. Small injection of contrast demonstrates good position of the GJ tube, tip beyond the ligament of Treitz. The retention balloon was deflated. 25 mL of clear red tinged fluid returned from the balloon rated for 10 mL. The catheter was exchanged over an angled Glidewire for a new 24 [place] dual lumen device. Jejunal tip position beyond ligament of Treitz. Gastrostomy lumen maintained at the stomach, with the balloon inflated with 10 mL sterile saline within the stomach. External bumper applied. FLUOROSCOPY TIME:  Radiation Exposure Index (as provided by the fluoroscopic device): 11 mGy air Kerma COMPLICATIONS: None immediate FINDINGS: The existing GJ tube was malpositioned with the balloon in the duodenal bulb, over inflated with 25 mL, possibly relating to outflow obstruction related to the symptoms. Catheter exchanged for a new 24 [place]  dual lumen device, positioned appropriately as above. Okay for routine use. IMPRESSION: Successful replacement of gastrojejunostomy catheter. Electronically Signed   By: Nicoletta Barrier M.D.   On: 03/04/2024 07:54   CT Chest W Contrast Result Date: 03/03/2024 CLINICAL DATA:  Pneumonia. EXAM: CT CHEST WITH CONTRAST TECHNIQUE: Multidetector CT imaging of the chest was performed during intravenous contrast administration. RADIATION DOSE REDUCTION: This exam was performed according to the departmental dose-optimization program which includes automated exposure control, adjustment of the mA and/or kV according to patient size and/or use of iterative reconstruction technique. CONTRAST:  50mL OMNIPAQUE  IOHEXOL  350 MG/ML SOLN COMPARISON:  Chest CT dated 02/05/2024. FINDINGS: Evaluation is very limited due to respiratory motion as well as streak artifact caused by patient's arms and spinal hardware. Cardiovascular: There is no cardiomegaly or pericardial effusion. The thoracic  aorta is unremarkable. The origins of the great vessels of the aortic arch and the central pulmonary arteries appear patent. Mediastinum/Nodes: No hilar or mediastinal adenopathy. The esophagus is grossly unremarkable. No mediastinal fluid collection. Lungs/Pleura: Small loculated appearing right pleural effusion. Rounded consolidative changes in the right lower lobe likely round atelectasis. Pneumonia or mass is not excluded. Attention on follow-up imaging recommended. Upper Abdomen: Partially visualized percutaneous gastrostomy. Musculoskeletal: Osteopenia with scoliosis and degenerative changes. Spinal fusion hardware. No acute osseous pathology. IMPRESSION: 1. Small loculated appearing right pleural effusion. 2. Probably rounded atelectasis in the right lower lobe similar to prior CT. Attention on follow-up imaging recommended. Electronically Signed   By: Angus Bark M.D.   On: 03/03/2024 17:56   DG ABD ACUTE 2+V W 1V CHEST Result Date:  03/01/2024 CLINICAL DATA:  Shortness of breath with dark brown mucus and fever. EXAM: DG ABDOMEN ACUTE WITH 1 VIEW CHEST COMPARISON:  February 05, 2024 FINDINGS: There is no evidence of dilated bowel loops or free intraperitoneal air. A very large stool burden is noted. A percutaneous gastrostomy tube is seen with its distal tip overlying the body of the stomach. No radiopaque calculi or other significant radiographic abnormality is seen. Heart size and mediastinal contours are within normal limits. Both lungs are clear. Stable postoperative changes are seen throughout the thoracolumbar spine. IMPRESSION: Very large stool burden without evidence of bowel obstruction. Electronically Signed   By: Virgle Grime M.D.   On: 03/01/2024 16:09       The results of significant diagnostics from this hospitalization (including imaging, microbiology, ancillary and laboratory) are listed below for reference.     Microbiology: Recent Results (from the past 240 hours)  Blood Culture (routine x 2)     Status: None   Collection Time: 03/01/24  2:29 PM   Specimen: BLOOD LEFT FOREARM  Result Value Ref Range Status   Specimen Description   Final    BLOOD LEFT FOREARM Performed at [facility], [address], [place], [place] [address]    Special Requests   Final    BOTTLES DRAWN AEROBIC AND ANAEROBIC Blood Culture adequate volume Performed at [facility], [address] [place], [place] [address]    Culture   Final    NO GROWTH 5 DAYS Performed at [facility], [address] [place], [place] [address]    Report Status 03/06/2024 FINAL  Final  Culture, blood (Routine X 2) w Reflex to ID Panel     Status: None (Preliminary result)   Collection Time: 03/03/24  8:23 PM   Specimen: BLOOD RIGHT FOREARM  Result Value Ref Range Status   Specimen Description BLOOD RIGHT FOREARM  Final   Special Requests   Final    BOTTLES DRAWN AEROBIC AND ANAEROBIC Blood Culture adequate volume    Culture   Final    NO GROWTH 3 DAYS Performed at [facility], [address] [place], [place] [address]    Report Status PENDING  Incomplete  Culture, blood (Routine X 2) w Reflex to ID Panel     Status: None (Preliminary result)   Collection Time: 03/03/24  8:28 PM   Specimen: BLOOD LEFT FOREARM  Result Value Ref Range Status   Specimen Description BLOOD LEFT FOREARM  Final   Special Requests   Final    BOTTLES DRAWN AEROBIC AND ANAEROBIC Blood Culture adequate volume   Culture   Final    NO GROWTH 3  DAYS Performed at [facility], [address], [place], [place] [address]    Report Status PENDING  Incomplete  MRSA Next Gen by PCR, Nasal     Status: None   Collection Time: 03/04/24  4:41 PM   Specimen: Nasal Mucosa; Nasal Swab  Result Value Ref Range Status   MRSA by PCR Next Gen NOT DETECTED NOT DETECTED Final    Comment: (NOTE) The GeneXpert MRSA Assay (FDA approved for NASAL specimens only), is one component of a comprehensive MRSA colonization surveillance program. It is not intended to diagnose MRSA infection nor to guide or monitor treatment for MRSA infections. Test performance is not FDA approved in patients less than 75 years old. Performed at [facility], [address], [place], [place] [address]      Labs:  CBC: Recent Labs  Lab 03/01/24 1510 03/03/24 1115 03/04/24 0513 03/05/24 0426 03/06/24 0911  WBC 8.4 5.7 4.9 3.8* 3.5*  NEUTROABS 5.8 3.8  --   --   --   HGB 14.3 15.0 12.4* 11.9* 12.6*  HCT 41.7 46.1 37.8* 34.7* 38.3*  MCV 92.3 95.6 95.2 93.3 96.2  PLT 220 209 191 168 157   BMP &GFR Recent Labs  Lab 03/01/24 1510 03/03/24 1115 03/04/24 0513 03/05/24 0426 03/06/24 0349 03/06/24 0911  NA 135 137 139 139  --  135  K 3.9 3.3* 3.3* 3.4*  --  4.6  CL 97* 98 105 104  --  103  CO2 22 17* 21* 28  --  24  GLUCOSE 88 40* 63* 145*  --  96  BUN 6 6 <5* 6  --  6  CREATININE <0.30* 0.70 0.59* <0.30*  --  <0.30*  CALCIUM  9.6 9.6  8.5* 8.3*  --  8.7*  MG  --   --  1.9 2.0 2.0  --   PHOS  --   --  3.5 2.9 3.6  --    CrCl cannot be calculated (This lab value cannot be used to calculate CrCl because it is not a number: <0.30). Liver & Pancreas: Recent Labs  Lab 03/01/24 1510 03/03/24 1115 03/04/24 0513 03/05/24 0426  AST 19 28 23 19   ALT 14 17 17 20   ALKPHOS 74 66 46 44  BILITOT 0.4 1.6* 1.4* 0.6  PROT 7.4 7.4 5.8* 5.3*  ALBUMIN 4.5 4.3 3.2* 3.0*   No results for input(s): "LIPASE", "AMYLASE" in the last 168 hours. No results for input(s): "AMMONIA" in the last 168 hours. Diabetic: No results for input(s): "HGBA1C" in the last 72 hours. Recent Labs  Lab 03/05/24 0205 03/05/24 0724 03/05/24 1344 03/05/24 2004 03/06/24 0756  GLUCAP 157* 148* 118* 113* 101*   Cardiac Enzymes: No results for input(s): "CKTOTAL", "CKMB", "CKMBINDEX", "TROPONINI" in the last 168 hours. No results for input(s): "PROBNP" in the last 8760 hours. Coagulation Profile: Recent Labs  Lab 03/01/24 1510  INR 1.0   Thyroid  Function Tests: No results for input(s): "TSH", "T4TOTAL", "FREET4", "T3FREE", "THYROIDAB" in the last 72 hours. Lipid Profile: No results for input(s): "CHOL", "HDL", "LDLCALC", "TRIG", "CHOLHDL", "LDLDIRECT" in the last 72 hours. Anemia Panel: No results for input(s): "VITAMINB12", "FOLATE", "FERRITIN", "TIBC", "IRON", "RETICCTPCT" in the last 72 hours. Urine analysis:    Component Value Date/Time   COLORURINE YELLOW 03/01/2024 1800   APPEARANCEUR CLEAR 03/01/2024 1800   LABSPEC 1.020 03/01/2024 1800   PHURINE 7.0 03/01/2024 1800   GLUCOSEU NEGATIVE 03/01/2024 1800   GLUCOSEU NEGATIVE 08/03/2023 1431  HGBUR NEGATIVE 03/01/2024 1800   HGBUR negative 10/29/2009 1406   BILIRUBINUR NEGATIVE 03/01/2024 1800   BILIRUBINUR neg 01/08/2016 1407   KETONESUR >=80 (A) 03/01/2024 1800   PROTEINUR 30 (A) 03/01/2024 1800   UROBILINOGEN 0.2 08/03/2023 1431   NITRITE NEGATIVE 03/01/2024 1800   LEUKOCYTESUR  NEGATIVE 03/01/2024 1800   Sepsis Labs: Invalid input(s): "PROCALCITONIN", "LACTICIDVEN"   SIGNED:  Theadore Finger, MD  [name] 03/06/2024, 10:26 PM

## 2024-03-06 NOTE — Care Management Obs Status (Signed)
 MEDICARE OBSERVATION STATUS NOTIFICATION   Patient Details  Name: [name] MRN: [id number] Date of Birth: 09/10/1986   Medicare Observation Status Notification Given:       [name] 03/06/2024, 11:12 AM

## 2024-03-06 NOTE — Progress Notes (Signed)
 Discharge instructions reviewed with [name], and his [name] (caregiver).  Copy of instructions given to [name]. [facility] pharmacy has filled Chad script and will be picked up on the way out for discharge. [name] has called her husband and he is on his way with Chad handicap [name] to pick up [name].  [name] getting [name] dressed at this time.  Changed and cleaned Chad PEG tube site, old blood and yellow drainage noted on dressing and around tube site, some pinkness of tissue around tube. Cleaned site area and new split gauze applied and secured with small amount hypafix tape. [name] tolerated well.  [name] will d/c'd via his wheelchair with belongings, with his [name] and father when he arrives and will be escorted by staff.   Jalissa Heinzelman,RN SWOT

## 2024-03-06 NOTE — Care Management Important Message (Signed)
 Important Message  Patient Details  Name: [name] MRN: [id number] Date of Birth: 12-17-1985   Important Message Given:        [name] 03/06/2024, 11:12 AM

## 2024-03-06 NOTE — Plan of Care (Signed)
  Problem: Education: Goal: Knowledge of General Education information will improve Description: Including pain rating scale, medication(s)/side effects and non-pharmacologic comfort measures Outcome: Adequate for Discharge   Problem: Health Behavior/Discharge Planning: Goal: Ability to manage health-related needs will improve Outcome: Adequate for Discharge   Problem: Clinical Measurements: Goal: Ability to maintain clinical measurements within normal limits will improve Outcome: Adequate for Discharge Goal: Will remain free from infection Outcome: Adequate for Discharge Goal: Diagnostic test results will improve Outcome: Adequate for Discharge Goal: Respiratory complications will improve Outcome: Adequate for Discharge Goal: Cardiovascular complication will be avoided Outcome: Adequate for Discharge   Problem: Activity: Goal: Risk for activity intolerance will decrease Outcome: Adequate for Discharge   Problem: Nutrition: Goal: Adequate nutrition will be maintained 03/06/2024 1121 by Franki Isles, RN Outcome: Adequate for Discharge 03/06/2024 0947 by Franki Isles, RN Outcome: Progressing   Problem: Coping: Goal: Level of anxiety will decrease Outcome: Adequate for Discharge   Problem: Elimination: Goal: Will not experience complications related to bowel motility Outcome: Adequate for Discharge Goal: Will not experience complications related to urinary retention Outcome: Adequate for Discharge   Problem: Pain Managment: Goal: General experience of comfort will improve and/or be controlled Outcome: Adequate for Discharge   Problem: Safety: Goal: Ability to remain free from injury will improve Outcome: Adequate for Discharge   Problem: Skin Integrity: Goal: Risk for impaired skin integrity will decrease Outcome: Adequate for Discharge   Problem: Inadequate Intake (NI-2.1) Goal: Food and/or nutrient delivery Description: Individualized approach  for food/nutrient provision. Outcome: Adequate for Discharge

## 2024-03-06 NOTE — Plan of Care (Signed)
   Problem: Nutrition: Goal: Adequate nutrition will be maintained Outcome: Progressing

## 2024-03-07 ENCOUNTER — Telehealth: Payer: Self-pay | Admitting: *Deleted

## 2024-03-07 ENCOUNTER — Other Ambulatory Visit (HOSPITAL_COMMUNITY): Payer: Self-pay

## 2024-03-07 NOTE — Transitions of Care (Post Inpatient/ED Visit) (Signed)
 03/07/2024  Name: [name] MRN: [id number] DOB: July 21, 1986  Today's TOC FU Call Status: Today's TOC FU Call Status:: Successful TOC FU Call Completed TOC FU Call Complete Date: 03/07/24 Patient's Name and Date of Birth confirmed.  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  He is doing fine, we got the antibiotic; he is at the same facility- [name]- they provide total care for him") Any questions or concerns?: No  Unable to determine from review of hospital discharge notes whether patient was discharged from hospital back to facility or to his home: mother clarified that she and patient's father transported patient back to facility where he resides and receives total care  Items Reviewed: Did you receive and understand the discharge instructions provided?: Yes (reviewed with patient' mother/ caregiver who verbalizes good understanding of same) Medications obtained,verified, and reconciled?: Partial Review Completed (Partial medication review completed; confirmed patient obtained/ is taking all newly Rx'd medications as instructed; Facility staff-manages medications and patient's mother denies questions/ concerns around medications today) Reason for Partial Mediation Review: patient's mother-caregiver declined full review: reports medications were reviewed at time of hospital discharge and that the facility that patient resides in manages all aspects of medication administration Any new allergies since your discharge?: No Dietary orders reviewed?: Yes Type of Diet Ordered:: "He  is still getting the tube feeding" Do you have support at home?: Yes People in Home [RPT]: facility resident Name of Support/Comfort Primary Source: Mother/ caregiver reports patient requires total care for all self-care activities; she reports patient has now been transported by she and her husband back to the facility he resides in at baseline  Medications Reviewed Today: Medications Reviewed Today     Reviewed by Rainy Rothman M, RN (Registered Nurse) on 03/07/24 at 1330  Med List Status: <None>   Medication Order Taking? Sig Documenting Provider Last Dose Status Informant  albuterol  (PROVENTIL ) (2.5 MG/3ML) 0.083% nebulizer solution [id number]  Take 3 mLs (2.5 mg total) by nebulization every 4 (four) hours as needed for wheezing or shortness of breath. Everlina Hock, NP  Active Mother, Care Giver  clotrimazole -betamethasone  (LOTRISONE ) cream [id number]  Apply 1 Application topically 2 (two) times daily as needed. Everlina Hock, NP  Active Mother, Care Giver  dantrolene  (DANTRIUM ) 100 MG capsule [id number]  Take 1 capsule (100 mg total) by mouth 3 (three) times daily.  Patient taking differently: Place 100 mg into feeding tube 3 (three) times daily.   Rawland Caddy, MD  Active Mother, Care Giver  DEPAKOTE  SPRINKLES 125 MG capsule [id number]  Take 250-375 mg by mouth See admin instructions. Take 375mg  (3 capsules) by mouth at 0900, 250mg  (2 capsules) at 2030, and 250mg  (2 capsules) at 2130. G tube [provider]  Active Mother, Care Giver  Dextromethorphan -guaiFENesin  5-100 MG/5ML LIQD [id number]  Give 20 mLs by tube every 6 (six) hours as needed (Cough). [provider]  Active Mother, Care Giver  diazepam  (VALIUM ) 5 MG tablet [id number]  Place 5 mg into feeding tube daily as needed for anxiety. [provider]  Active Mother, Care Giver  doxepin  (SINEQUAN ) 25 MG capsule [id number]  Place 25 mg into  feeding tube at bedtime. [provider]  Active Mother,  Care Giver  escitalopram  (LEXAPRO ) 5 MG/5ML solution [id number]  Place 20 mLs into feeding tube daily. [provider]  Active Mother, Care Giver  lamoTRIgine  (LAMICTAL ) 25 MG tablet [id number]  TAKE 2 TABLETS BY MOUTH 2 TIMES DAILY.  Patient taking differently: Place 50 mg into feeding tube 2 (two) times daily.   Phebe Brasil, MD  Active Mother, Care Giver  levofloxacin  (LEVAQUIN ) 25 MG/ML solution [id number]  Place 30 mLs (750 mg total) into feeding tube daily for 11 days. Gonfa, Taye T, MD  Active   loperamide HCl (IMODIUM A-D) 1 MG/7.5ML solution [id number]  Place 15 mLs (2 mg total) into feeding tube as needed for diarrhea or loose stools. Gonfa, Taye T, MD  Active   LORazepam  (ATIVAN ) 1 MG tablet [id number]  Place 1 mg into feeding tube at bedtime. [provider]  Active Mother, Care Giver  Nutritional Supplements (FEEDING SUPPLEMENT, KATE FARMS STANDARD 1.4,) LIQD liquid [id number]  Take 325 mLs by mouth as directed. 3 carton over 16 hrs [provider]  Active Mother, Care Giver  OLANZapine  (ZYPREXA ) 10 MG tablet [id number]  Place 10 mg into feeding tube at bedtime. [provider]  Active Mother, Care Giver           Med Note Drexel Gentles, [facility]   Fri Mar 03, 2024  3:50 PM)    OLANZapine  (ZYPREXA ) 5 MG tablet [id number]  Place 5 mg into feeding tube daily. @1700  [provider]  Active Mother, Care Giver  OVER THE COUNTER MEDICATION [id number]  Give 1 capsule by tube 2 (two) times daily. Muscle Calm Formula [provider]  Active Mother, Care Giver  OVER THE COUNTER MEDICATION [id number]  Give 1 capsule by tube at bedtime. CBD/Melaonin [provider]  Active Mother, Care Giver  oxyBUTYnin  (DITROPAN ) 5 MG/5ML solution [id number]  Place 7.5 mg into feeding tube at bedtime. [provider]  Active Mother, Care Giver  Saccharomyces boulardii (FLORASTOR PO) [id number]  Give 1 capsule by tube every evening. [provider]  Active  Mother, Care Giver  sucralfate  (CARAFATE ) 1 g tablet [id number]  Take 1 tablet (1 g total) by mouth daily. TAKE 1 TABLET BY MOUTH 4 TIMES DAILY WITH MEALS AND AT BEDTIME  Patient taking differently: Place 1 g into feeding tube daily.   Zehr, Jessica D, PA-C  Active Mother, Care Giver           Med Note Drexel Gentles, [facility]   Fri Mar 03, 2024  3:53 PM)    traZODone  (DESYREL ) 150 MG tablet [id number]  Place 150 mg into feeding tube at bedtime. [provider]  Active Mother, Care Giver           Home Care and Equipment/Supplies: Were Home Health Services Ordered?: No Any new equipment or medical supplies ordered?: No  Functional Questionnaire: Do you need assistance with bathing/showering or dressing?: Yes (mother reports patient is resident at facility where he is provided for "total care needs") Do you need assistance with meal preparation?: Yes (mother reports patient is resident at facility where he is provided for "total care needs") Do you need assistance with eating?: Yes (mother reports patient is resident at facility where he is provided for "total care needs") Do you have difficulty maintaining continence: Yes (mother reports patient is resident at facility where he is provided for "total care needs") Do you need assistance with getting out of bed/getting out of a chair/moving?:  Yes (mother reports patient is resident at facility where he is provided for "total care needs") Do you have difficulty managing or taking your medications?: Yes (mother reports patient is resident at facility where he is provided for "total care needs")  Follow up appointments reviewed: PCP Follow-up appointment confirmed?: Yes Date of PCP follow-up appointment?: 03/16/24 Follow-up Provider: PCP- covering provider Specialist Hospital Follow-up appointment confirmed?: Yes Date of Specialist follow-up appointment?: 04/10/24 Follow-Up Specialty Provider:: pulmonary provider Do you need transportation  to your follow-up appointment?: No Do you understand care options if your condition(s) worsen?: Yes-patient verbalized understanding  SDOH Interventions Today    Flowsheet Row Most Recent Value  SDOH Interventions   Food Insecurity Interventions Intervention Not Indicated  [per patient's mother: patient resides at permanent care facility and gets tube feedings- she denies food insecurity during [facility] call today]  Housing Interventions Intervention Not Indicated  [per patient's mother: patient resides at permanent care facility and gets "total care" there]  Transportation Interventions Intervention Not Indicated  [per patient's mother: patient resides at permanent care facility and she and her husband- patient's father- rovide all transportation as needed: reports have handicapped van]  Utilities Interventions Intervention Not Indicated  [per patient's mother: patient resides at permanent care facility - she denies concerns around facility]      See TOC assessment tabs for additional assessment/ TOC intervention information  Total time spent from review to signing of note/ including any care coordination interventions:  43 minutes  Pls call/ message for questions,  Relena Ivancic Mckinney Caoilainn Sacks, RN, BSN, Media planner  Transitions of Care  [facility] [phone]: direct office

## 2024-03-08 ENCOUNTER — Inpatient Hospital Stay: Admitting: Family Medicine

## 2024-03-08 LAB — CULTURE, BLOOD (ROUTINE X 2)
Culture: NO GROWTH
Culture: NO GROWTH
Special Requests: ADEQUATE
Special Requests: ADEQUATE

## 2024-03-09 ENCOUNTER — Encounter (HOSPITAL_COMMUNITY): Payer: Self-pay

## 2024-03-14 NOTE — Telephone Encounter (Signed)
Pyrtle pt  

## 2024-03-16 ENCOUNTER — Inpatient Hospital Stay: Admitting: Family Medicine

## 2024-04-04 ENCOUNTER — Ambulatory Visit (INDEPENDENT_AMBULATORY_CARE_PROVIDER_SITE_OTHER): Admitting: Physician Assistant

## 2024-04-04 DIAGNOSIS — M25562 Pain in left knee: Secondary | ICD-10-CM | POA: Diagnosis not present

## 2024-04-04 DIAGNOSIS — G8929 Other chronic pain: Secondary | ICD-10-CM | POA: Diagnosis not present

## 2024-04-04 DIAGNOSIS — M25561 Pain in right knee: Secondary | ICD-10-CM

## 2024-04-04 MED ORDER — LIDOCAINE HCL 1 % IJ SOLN
3.0000 mL | INTRAMUSCULAR | Status: AC | PRN
  Administered 2024-04-04: 3 mL

## 2024-04-04 MED ORDER — METHYLPREDNISOLONE ACETATE 40 MG/ML IJ SUSP
40.0000 mg | INTRAMUSCULAR | Status: AC | PRN
Start: 2024-04-04 — End: 2024-04-04
  Administered 2024-04-04: 40 mg via INTRA_ARTICULAR

## 2024-04-04 NOTE — Progress Notes (Signed)
   Procedure Note  Patient: [name]             Date of Birth: [birth year]           MRN: [id number]             Visit Date: 04/04/2024 HPI: [name] comes in today with his mother and father requesting cortisone injections both knees.  He has chronic bilateral knee pain.  He was seen last in July 2024 and was given bilateral knee injections they felt that these injections were quite helpful.  He has had no new injuries.  He is someone who is wheelchair-bound with a history of spastic quadriplegic cerebral palsy.  His father states that he has had no recent fevers or chills however he did have an upper respiratory infection in May that has been treated is no longer having any symptoms and is on no antibiotics.  Bilateral knees: Very limited extension due to spasticity.  No abnormal warmth erythema or effusion of either knee.  Procedures: Visit Diagnoses:  1. Chronic pain of right knee   2. Chronic pain of left knee     Large Joint Inj: bilateral knee on 04/04/2024 2:26 PM Indications: pain Details: 22 G 1.5 in needle, anterolateral approach  Arthrogram: No  Medications (Right): 3 mL lidocaine  1 %; 40 mg methylPREDNISolone  acetate 40 MG/ML Medications (Left): 3 mL lidocaine  1 %; 40 mg methylPREDNISolone  acetate 40 MG/ML Outcome: tolerated well, no immediate complications Procedure, treatment alternatives, risks and benefits explained, specific risks discussed. Consent was given by the patient. Immediately prior to procedure a time out was called to verify the correct patient, procedure, equipment, support staff and site/side marked as required. Patient was prepped and draped in the usual sterile fashion.     Plan: He can follow-up as needed.  Injections no more often than every 3 months.  Questions were encouraged and answered of his parents who are present throughout the exam and procedure.

## 2024-04-06 ENCOUNTER — Inpatient Hospital Stay: Admitting: Family Medicine

## 2024-04-10 ENCOUNTER — Ambulatory Visit (INDEPENDENT_AMBULATORY_CARE_PROVIDER_SITE_OTHER): Admitting: Pulmonary Disease

## 2024-04-10 ENCOUNTER — Encounter: Payer: Self-pay | Admitting: Pulmonary Disease

## 2024-04-10 VITALS — BP 104/62 | HR 89 | Ht 60.0 in | Wt 95.0 lb

## 2024-04-10 DIAGNOSIS — J189 Pneumonia, unspecified organism: Secondary | ICD-10-CM

## 2024-04-10 NOTE — Progress Notes (Unsigned)
 [name]    [id number]    1985-12-18  Primary Care Physician:Blyth, Bonita Bussing, MD  Referring Physician: Neda Balk, MD [address] [place],  [place] [address]  Chief complaint: Follow up for recurrent pneumonias  HPI: 38 year old with history of cerebral palsy, recurrent pneumonia is due to poor secretion clearance. He is wheelchair-bound and needs assistance for all activities of daily living. History notable for T9 - C6 spine fusion at [facility] in September 2011, peg tube placed in in 2012 for hydration.  He has previously followed with Dr. Villa Greaser but not seen back in clinic for the past four years. He percussion vest at home and suction devices to help with mucus related clearance. He also uses Mucinex  DS. Followed by palliative care at home with a DNR order. Family wishes that he stay out of the hospital as much as possible as he has difficult time with hospitalizations and would prefer to do as much of medical care at home  He has been struggling with pneumonia for the past month or two. He has taken 2 rounds of steroids, doxycycline , amoxicillin , azithromycin  and omnicef  from primary care office. Chest x-ray and early May showed right lower lobe infiltrate. Sputum cultures is growing pan sensitive Pseudomonas and streptococcus agalactiae  Continues to have recurrent cough with congestion and mucus production. No fevers or chills  Interim history: Discussed the use of AI scribe software for clinical note transcription with the patient, who gave verbal consent to proceed.  The patient, with a history of recurrent pneumonia, recently completed a course of antibiotics for pneumonia. The caregiver reports that the patient had a cough for four days, prompting a visit to the doctor. Upon examination, decreased breath sounds were noted in the right lung, and a subsequent chest x-ray confirmed pneumonia. The patient was treated with azithromycin  and vantin , a new antibiotic,  for nine days, along with eight days of prednisone . The patient tolerated the new antibiotic well and did not exhibit any severe symptoms such as fever. The patient has a history of not tolerating cephalosporins, but did not have any adverse reactions to the new antibiotic.  The patient also had an episode of increased heart rate in October, which led to a brief course of atenolol . However, the medication has since been discontinued. The patient's mental status was also affected during this time, with reports of agitation, sleep disturbances, and discomfort.  The patient has a history of recurrent respiratory infections, with intervals of approximately six to eight weeks between episodes. The patient does not eat orally due to aspiration risk, and receives nutrition via a jejunal feeding tube. Despite this, there is concern that the patient may be aspirating saliva, contributing to the recurrent respiratory infections.   Outpatient Encounter Medications as of 04/10/2024  Medication Sig   albuterol  (PROVENTIL ) (2.5 MG/3ML) 0.083% nebulizer solution Take 3 mLs (2.5 mg total) by nebulization every 4 (four) hours as needed for wheezing or shortness of breath.   clotrimazole -betamethasone  (LOTRISONE ) cream Apply 1 Application topically 2 (two) times daily as needed.   dantrolene  (DANTRIUM ) 100 MG capsule Take 1 capsule (100 mg total) by mouth 3 (three) times daily. (Patient taking differently: Place 100 mg into feeding tube 3 (three) times daily.)   DEPAKOTE  SPRINKLES 125 MG capsule Take 250-375 mg by mouth See admin instructions. Take 375mg  (3 capsules) by mouth at 0900, 250mg  (2 capsules) at 2030, and 250mg  (2 capsules) at 2130. G tube   Dextromethorphan -guaiFENesin  5-100  MG/5ML LIQD Give 20 mLs by tube every 6 (six) hours as needed (Cough).   diazepam  (VALIUM ) 5 MG tablet Place 5 mg into feeding tube daily as needed for anxiety.   doxepin  (SINEQUAN ) 25 MG capsule Place 25 mg into feeding tube at bedtime.    escitalopram  (LEXAPRO ) 5 MG/5ML solution Place 20 mLs into feeding tube daily.   lamoTRIgine  (LAMICTAL ) 25 MG tablet TAKE 2 TABLETS BY MOUTH 2 TIMES DAILY. (Patient taking differently: Place 50 mg into feeding tube 2 (two) times daily.)   LORazepam  (ATIVAN ) 1 MG tablet Place 1 mg into feeding tube at bedtime.   Nutritional Supplements (FEEDING SUPPLEMENT, KATE FARMS STANDARD 1.4,) LIQD liquid Take 325 mLs by mouth as directed. 3 carton over 16 hrs   OLANZapine  (ZYPREXA ) 10 MG tablet Place 10 mg into feeding tube at bedtime.   OLANZapine  (ZYPREXA ) 5 MG tablet Place 5 mg into feeding tube daily. @1700    OVER THE COUNTER MEDICATION Give 1 capsule by tube 2 (two) times daily. Muscle Calm Formula   OVER THE COUNTER MEDICATION Give 1 capsule by tube at bedtime. CBD/Melaonin   oxyBUTYnin  (DITROPAN ) 5 MG/5ML solution Place 7.5 mg into feeding tube at bedtime.   Saccharomyces boulardii (FLORASTOR PO) Give 1 capsule by tube every evening.   sucralfate  (CARAFATE ) 1 g tablet Take 1 tablet (1 g total) by mouth daily. TAKE 1 TABLET BY MOUTH 4 TIMES DAILY WITH MEALS AND AT BEDTIME (Patient taking differently: Place 1 g into feeding tube daily.)   traZODone  (DESYREL ) 150 MG tablet Place 150 mg into feeding tube at bedtime.   loperamide  HCl (IMODIUM  A-D) 1 MG/7.5ML solution Place 15 mLs (2 mg total) into feeding tube as needed for diarrhea or loose stools. (Patient not taking: Reported on 04/10/2024)   No facility-administered encounter medications on file as of 04/10/2024.   Physical Exam: Blood pressure 124/64, pulse 88, weight 92 lb (41.7 kg), SpO2 97 %. Gen:      No acute distress HEENT:  EOMI, sclera anicteric Neck:     No masses; no thyromegaly Lungs:    Clear to auscultation bilaterally; normal respiratory effort CV:         Regular rate and rhythm; no murmurs Abd:      + bowel sounds; soft, non-tender; no palpable masses, no distension Ext:    No edema; adequate peripheral perfusion Skin:      Warm  and dry; no rash Neuro: alert and oriented x 3 Psych: normal mood and affect  Data Reviewed: Imaging: CT chest 01/16/15 - volume loss in the right lower lobe suggest affronted atelectasis, pleural fluid collection versus pleural fluid in the right hemothorax.  Chest x-ray 03/03/22 - new right lower lobe infiltrate. Chest x-ray 08/22/2023-chronic right hilar opacity, small right effusion Chest x-ray 09/13/2023-new right lower lobe airspace disease I have reviewed images personally.  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 Sputum cultures grew Pseudomonas in past. Recent episode treated with Azithromycin  and Vantin . Currently asymptomatic but with residual cough. No fever or other signs of active infection.  -Order chest x-ray today to assess resolution of pneumonia. -No antibiotics needed at this time. -Follow-up in 6 months or sooner if symptoms recur. - Continue mucociliary clearance with percussion vest cough assist and mucinex   Aspiration Risk History of recurrent pneumonia, possibly secondary  to aspiration. Patient is fed via jejunal tube due to risk of aspiration with oral intake. -Continue jejunal feeding. -Monitor for signs of aspiration pneumonia.  Tachycardia (resolved) Previous episode of increased heart rate, treated with Atenolol  but now discontinued. No current symptoms. -No action needed at this time.   Plan/Recommendations: Chest x ray  Phyllis Breeze MD [facility] 04/10/2024, 1:12 PM  CC: Neda Balk, MD

## 2024-04-10 NOTE — Patient Instructions (Signed)
 We will order a CoughAssist as advised to help with airway clearance Continue percussion vest Follow-up in 6 months

## 2024-05-02 MED ORDER — SUCRALFATE 1 G PO TABS: 1.0000 g | ORAL_TABLET | Freq: Every day | ORAL | 3 refills | Status: DC

## 2024-05-15 ENCOUNTER — Encounter: Payer: Self-pay | Admitting: Family Medicine

## 2024-05-16 ENCOUNTER — Other Ambulatory Visit: Payer: Self-pay | Admitting: Family Medicine

## 2024-05-16 MED ORDER — HYDROCODONE BIT-HOMATROP MBR 5-1.5 MG/5ML PO SOLN: 5.0000 mL | Freq: Four times a day (QID) | ORAL | 0 refills | Status: DC | PRN

## 2024-05-18 ENCOUNTER — Other Ambulatory Visit (HOSPITAL_COMMUNITY): Payer: Medicare Other

## 2024-05-23 ENCOUNTER — Ambulatory Visit: Payer: Self-pay

## 2024-05-23 MED ORDER — AMOXICILLIN-POT CLAVULANATE 875-125 MG PO TABS
1.0000 | ORAL_TABLET | Freq: Two times a day (BID) | ORAL | 0 refills | Status: DC
Start: 2024-05-23 — End: 2024-05-25

## 2024-05-23 MED ORDER — AZITHROMYCIN 250 MG PO TABS: 250.0000 mg | ORAL_TABLET | Freq: Every day | ORAL | 0 refills | Status: DC

## 2024-05-23 NOTE — Telephone Encounter (Signed)
 I called and discussed with patient.  Cough with mucus appears clear with no fevers or chills Please send in prescription for Augmentin  for 7 days and Z-Pak for 5 days in case his symptoms worsen He has an appointment with his primary care physician in 2 days.

## 2024-05-23 NOTE — Telephone Encounter (Signed)
 Scripts have been sent

## 2024-05-23 NOTE — Telephone Encounter (Signed)
 FYI Only or Action Required?: Action required by provider: request for appointment and requesting RX.  Patient is followed in Pulmonology for PNA, last seen on 04/10/2024 by Mannam, Praveen, MD.  Called Nurse Triage reporting Cough.  Symptoms began several days ago.  Interventions attempted: OTC medications: mucinex  and Nebulizer treatments.  Symptoms are: gradually worsening.  Triage Disposition: See Physician Within 24 Hours  Patient/caregiver understands and will follow disposition?: Yes      Copied from CRM [id number]. Topic: Clinical - Red Word Triage >> May 23, 2024 10:07 AM [name] wrote: Red Word that prompted transfer to Nurse Triage: Worsening cough Reason for Disposition  [1] Continuous (nonstop) coughing interferes with work or school AND [2] no improvement using cough treatment per Care Advice  Answer Assessment - Initial Assessment Questions E2C2 Pulmonary Triage - Initial Assessment Questions Chief Complaint (e.g., cough, sob, wheezing, fever, chills, sweat or additional symptoms) *Go to specific symptom protocol after initial questions. Worsening/increased productive white cough  Endorses aspiration PNA x 2 this year Caregiver reports calling PCP without access  How long have symptoms been present? X few days  Have you tested for COVID or Flu? Note: If not, ask patient if a home test can be taken. If so, instruct patient to call back for positive results. No  MEDICINES:   Have you used any OTC meds to help with symptoms? Yes If yes, ask What medications? Mucinex   Have you used your inhalers/maintenance medication? No If yes, What medications? Albuterol  NEB PRN  If inhaler, ask How many puffs and how often? Note: Review instructions on medication in the chart. Hx of severe dysphagia  OXYGEN : Do you wear supplemental oxygen ? No If yes, How many liters are you supposed to use? N/a  Do you monitor your oxygen  levels? No If yes,  What is your reading (oxygen  level) today? Reports lives in a home and will check  What is your usual oxygen  saturation reading?  (Note: Pulmonary O2 sats should be 90% or greater) N/a     1. ONSET: When did the cough begin?      See above 2. SEVERITY: How bad is the cough today?      See above 3. SPUTUM: Describe the color of your sputum (e.g., none, dry cough; clear, white, yellow, green)     See above 4. HEMOPTYSIS: Are you coughing up any blood? If Yes, ask: How much? (e.g., flecks, streaks, tablespoons, etc.)     denies 5. DIFFICULTY BREATHING: Are you having difficulty breathing? If Yes, ask: How bad is it? (e.g., mild, moderate, severe)      denies 6. FEVER: Do you have a fever? If Yes, ask: What is your temperature, how was it measured, and when did it start?     unknown 7. CARDIAC HISTORY: Do you have any history of heart disease? (e.g., heart attack, congestive heart failure)      denies 8. LUNG HISTORY: Do you have any history of lung disease?  (e.g., pulmonary embolus, asthma, emphysema)    Restrictive lung disease 9. PE RISK FACTORS: Do you have a history of blood clots? (or: recent major surgery, recent prolonged travel, bedridden)     denies 10. OTHER SYMPTOMS: Do you have any other symptoms? (e.g., runny nose, wheezing, chest pain)       denies 11. PREGNANCY: Is there any chance you are pregnant? When was your last menstrual period?       N/a 12. TRAVEL: Have you traveled out of the country  in the last month? (e.g., travel history, exposures)       N/a    Parent reports that pt has hx of aspiration PNA x 2 this year and pt is now having worsening/increased sputum production. Pt is in group home and parents are scheduled to be out of town this coming weekend and is requesting acute appt tomorrow afternoon or Rx. Triager will forward encounter for Dr. Theophilus 's office to review and advise. Parent verbalized understanding and is  expecting call back from office for next steps. Triager also advised that if pt does not hear back from office, to follow disposition for further evaluation/treatment.  Of note, pt does have PCP appt on 7/24.  Protocols used: Cough - Acute Productive-A-AH

## 2024-05-25 ENCOUNTER — Encounter: Payer: Self-pay | Admitting: Family Medicine

## 2024-05-25 ENCOUNTER — Ambulatory Visit (INDEPENDENT_AMBULATORY_CARE_PROVIDER_SITE_OTHER): Admitting: Family Medicine

## 2024-05-25 ENCOUNTER — Ambulatory Visit: Payer: Self-pay | Admitting: Family Medicine

## 2024-05-25 ENCOUNTER — Ambulatory Visit (HOSPITAL_BASED_OUTPATIENT_CLINIC_OR_DEPARTMENT_OTHER)
Admission: RE | Admit: 2024-05-25 | Discharge: 2024-05-25 | Disposition: A | Discharge status: Home / Self Care | Source: Ambulatory Visit | Attending: Family Medicine | Admitting: Family Medicine

## 2024-05-25 VITALS — BP 108/61 | HR 110

## 2024-05-25 DIAGNOSIS — R059 Cough, unspecified: Secondary | ICD-10-CM | POA: Diagnosis not present

## 2024-05-25 DIAGNOSIS — J189 Pneumonia, unspecified organism: Secondary | ICD-10-CM | POA: Diagnosis not present

## 2024-05-25 DIAGNOSIS — R051 Acute cough: Secondary | ICD-10-CM

## 2024-05-25 DIAGNOSIS — F339 Major depressive disorder, recurrent, unspecified: Secondary | ICD-10-CM | POA: Diagnosis not present

## 2024-05-25 DIAGNOSIS — G4700 Insomnia, unspecified: Secondary | ICD-10-CM | POA: Diagnosis not present

## 2024-05-25 DIAGNOSIS — J690 Pneumonitis due to inhalation of food and vomit: Secondary | ICD-10-CM | POA: Diagnosis not present

## 2024-05-25 MED ORDER — AZITHROMYCIN 200 MG/5ML PO SUSR: 250.0000 mg | Freq: Every day | ORAL | 0 refills | Status: AC

## 2024-05-25 NOTE — Progress Notes (Signed)
 Acute Office Visit  Subjective:     Patient ID: [name], male    DOB: Sep 27, 1986, 38 y.o.   MRN: [id number]  Chief Complaint  Patient presents with   Cough    HPI Patient is in today for cough.   Discussed the use of AI scribe software for clinical note transcription with the patient, who gave verbal consent to proceed.  History of Present Illness Jacobe Study Harbert Fitterer is a 38 year old male with a history of pneumonia who presents with increased coughing and chest soreness. He is accompanied by his father.  He has been experiencing increased coughing since Thursday, with the last two nights being particularly severe. His chest pain is associated with the coughing, and he requires frequent suctioning. They called pulmonology regarding this a few days ago and report provider was going to send in ABX (Augmentin  and Zpak), but the pharmacy never received the scripts. Parents report Augmentin  causes diarrhea so they'd prefer to avoid that if possible.   He denies fever but feels generally unwell. He also has a headache. There is no nasal congestion or sneezing, but he has throat drainage. His father notes that his roommate recently passed away from aspiration pneumonia, which has been distressing for him.         ROS All review of systems negative except what is listed in the HPI      Objective:    BP 108/61   Pulse (!) 110   SpO2 98%    Physical Exam Vitals reviewed.  Constitutional:      General: He is not in acute distress.    Appearance: Normal appearance. He is not ill-appearing.  HENT:     Right Ear: Tympanic membrane normal.     Left Ear: Tympanic membrane normal.     Nose:     Comments: Postnasal drainage Cardiovascular:     Rate and Rhythm: Regular rhythm. Tachycardia present.  Pulmonary:     Effort: Pulmonary effort is normal. No respiratory distress.     Breath sounds: Normal breath sounds. No wheezing, rhonchi or rales.  Skin:    General:  Skin is warm and dry.  Neurological:     Mental Status: He is alert and oriented to person, place, and time.  Psychiatric:        Mood and Affect: Mood normal.        Behavior: Behavior normal.        Thought Content: Thought content normal.        Judgment: Judgment normal.     No results found for any visits on 05/25/24.      Assessment & Plan:   Problem List Items Addressed This Visit   None Visit Diagnoses       Acute cough    -  Primary   Relevant Medications   azithromycin  (ZITHROMAX ) 200 MG/5ML suspension   Other Relevant Orders   DG Chest 2 View (Completed)     Pneumonia of right lower lobe due to infectious organism       Relevant Medications   azithromycin  (ZITHROMAX ) 200 MG/5ML suspension      Stable on exam today. Not coughing during visit and no adventitious lung sounds. Mildly tachycardic, but gets anxious when we are checking vitals.  Will go ahead and send in azithromycin  given his complex history Chext xray today - if concerning for pneumonia, will add another antibiotic.  Continue supportive measures, Mucinex , chest PT vest, etc Discussed with PCP,  Dr. Domenica Patient/family agreeable to plan and aware of symptoms to monitor for.    Meds ordered this encounter  Medications   azithromycin  (ZITHROMAX ) 200 MG/5ML suspension    Sig: Take 6.3 mLs (250 mg total) by mouth daily for 5 doses.    Dispense:  31.5 mL    Refill:  0    Supervising Provider:   DOMENICA BLACKBIRD A [[id number]]    Return if symptoms worsen or fail to improve.  Waddell KATHEE Mon, NP

## 2024-05-25 NOTE — Patient Instructions (Signed)
 Sending in azithromycin  for you. Chest xray today - may add another antibiotic depending on what results we see.  Continue supportive measures including Mucinex , vest, etc.   Please contact office for follow-up if symptoms do not improve or worsen. Seek emergency care if symptoms become severe.

## 2024-06-08 ENCOUNTER — Telehealth: Payer: Self-pay | Admitting: Pulmonary Disease

## 2024-06-08 NOTE — Telephone Encounter (Signed)
 Jackson Avelina Jackson Avelina; Dale, Sherlean LENORA Joylene Adine; Tucker, Dolanda; Cain, [place]; 1 other This order needs to be signed. Its only verbally co-signed currently.  Hey Dr. Theophilus, this patient's DME order needs to be co-signed--it can't be verbally co-signed due to insurance.

## 2024-06-23 ENCOUNTER — Ambulatory Visit (HOSPITAL_BASED_OUTPATIENT_CLINIC_OR_DEPARTMENT_OTHER)
Admission: RE | Admit: 2024-06-23 | Discharge: 2024-06-23 | Disposition: A | Discharge status: Home / Self Care | Source: Ambulatory Visit | Attending: Student | Admitting: Student

## 2024-06-23 ENCOUNTER — Ambulatory Visit: Admitting: Student

## 2024-06-23 ENCOUNTER — Encounter: Payer: Self-pay | Admitting: Student

## 2024-06-23 ENCOUNTER — Ambulatory Visit: Payer: Self-pay | Admitting: Student

## 2024-06-23 VITALS — BP 116/70 | HR 88 | Temp 97.4°F | Resp 16 | Wt 92.0 lb

## 2024-06-23 DIAGNOSIS — Z0389 Encounter for observation for other suspected diseases and conditions ruled out: Secondary | ICD-10-CM | POA: Diagnosis not present

## 2024-06-23 DIAGNOSIS — Z4682 Encounter for fitting and adjustment of non-vascular catheter: Secondary | ICD-10-CM | POA: Diagnosis not present

## 2024-06-23 DIAGNOSIS — R052 Subacute cough: Secondary | ICD-10-CM | POA: Insufficient documentation

## 2024-06-23 DIAGNOSIS — Z8701 Personal history of pneumonia (recurrent): Secondary | ICD-10-CM

## 2024-06-23 DIAGNOSIS — R509 Fever, unspecified: Secondary | ICD-10-CM

## 2024-06-23 LAB — POC COVID19 BINAXNOW: SARS Coronavirus 2 Ag: NEGATIVE

## 2024-06-23 MED ORDER — HYDROCODONE BIT-HOMATROP MBR 5-1.5 MG/5ML PO SOLN: 5.0000 mL | Freq: Four times a day (QID) | ORAL | 0 refills | Status: DC | PRN

## 2024-06-23 MED ORDER — CEFPODOXIME PROXETIL 100 MG/5ML PO SUSR
200.0000 mg | Freq: Two times a day (BID) | ORAL | 0 refills | Status: AC
Start: 2024-06-23 — End: 2024-06-28

## 2024-06-23 MED ORDER — DOXYCYCLINE HYCLATE 100 MG PO CAPS: 100.0000 mg | ORAL_CAPSULE | Freq: Two times a day (BID) | ORAL | 0 refills | Status: AC

## 2024-06-23 MED ORDER — DOXYCYCLINE MONOHYDRATE 25 MG/5ML PO SUSR: 100.0000 mg | Freq: Two times a day (BID) | ORAL | 0 refills | Status: DC

## 2024-06-23 NOTE — Progress Notes (Signed)
 Chief Complaint  Patient presents with   Cough   Fever    [name] here for URI complaints.  Pt lives in group home setting, presents to OV with his mother and father.  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  Patient was hospitalized twice for pneumonia over the past year.   Improvement following abx initial treatment.  Over the past week, the patient has developed worsening symptoms, including intense coughing, increased mucus production, yellow/white thic mucus, and fevers up to 102F over the last 5-6 days. Tylenol  has been used for fever control, the patient is afebrile during today's visit though he did receive Tylenol  today per parents.  Reports significant sleep disturbance due to persistent coughing and request  refill of Hycodan cough syrup.  Duration: 5 days  Treatment to date: Tylenol  Sick contacts: No  Patient denies fever, chills, SOB, CP, palpitations, dyspnea, edema, HA, vision changes, N/V/D, abdominal pain, urinary symptoms, rash, weight changes, and recent illness or hospitalizations.   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   Qualifier: Diagnosis of  By: Antonio ROSALEA Rockers  Working with PMR Dr Arlana and tolerating Botox  injections in hips, shoulders, etc with good  results   Skin lesion of right ear 03/16/2017   Thyroid  disease 08/01/2010   Qualifier: Diagnosis of  By: Nelson-Smith CMA (AAMA), Dottie      Objective BP 116/70 (BP Location: Right Arm, Patient Position: Sitting, Cuff Size: Normal)   Pulse 88   Temp (!) 97.4 F (36.3 C) (Axillary)   Resp 16   Wt 92 lb (41.7 kg) Comment: in wheelchair  SpO2 94%   BMI 17.97 kg/m  General: Awake, alert, appears stated age HEENT: AT, [place], ears patent b/l and TM's neg, nares patent w/o discharge, pharynx pink and without exudates, MMM Neck: No masses or asymmetry Heart: RRR Lungs: CTAB, no accessory muscle use Psych: Age appropriate judgment and insight, normal mood and affect  Subacute cough - Plan: cefpodoxime  (VANTIN ) 100 MG/5ML suspension, DG Chest 2 View, POC COVID-19 BinaxNow, HYDROcodone  bit-homatropine (HYDROMET) 5-1.5 MG/5ML syrup, doxycycline  (VIBRAMYCIN ) 100 MG capsule, DISCONTINUED: doxycycline  (VIBRAMYCIN ) 25 MG/5ML SUSR  Fever, unspecified fever cause - Plan: POC COVID-19 BinaxNow  Hx: recurrent pneumonia CXR pending Covid- NEG. Treat empirically for PNA Rx- Cefpodoxime  + Doxycycline  x 5 days. Hycodan syrup refilled.  Continue to push fluids,  Mucinex  as needed. If starting to experience resistant or worsening symptoms, or shortness of breath, seek immediate care. Pt and parents voiced understanding and agreement to the plan.  [name], DNP, AGNP-C 06/23/24 2:41 PM

## 2024-06-28 ENCOUNTER — Encounter: Payer: Medicare Other | Attending: Physical Medicine & Rehabilitation | Admitting: Physical Medicine & Rehabilitation

## 2024-06-28 ENCOUNTER — Encounter: Payer: Self-pay | Admitting: Physical Medicine & Rehabilitation

## 2024-06-28 VITALS — BP 116/70 | HR 88 | Ht 60.0 in | Wt 92.0 lb

## 2024-06-28 DIAGNOSIS — G800 Spastic quadriplegic cerebral palsy: Secondary | ICD-10-CM | POA: Diagnosis not present

## 2024-06-28 MED ORDER — ONABOTULINUMTOXINA 100 UNITS IJ SOLR
600.0000 [IU] | Freq: Once | INTRAMUSCULAR | Status: AC
  Administered 2024-06-28: 600 [IU] via INTRAMUSCULAR

## 2024-06-28 MED ORDER — SODIUM CHLORIDE (PF) 0.9 % IJ SOLN
6.0000 mL | Freq: Once | INTRAMUSCULAR | Status: AC
  Administered 2024-06-28: 6 mL

## 2024-06-28 NOTE — Telephone Encounter (Signed)
 Please create another order that doesn't require a verbal co-signature.

## 2024-06-28 NOTE — Patient Instructions (Signed)
 ALWAYS FEEL FREE TO CALL OUR OFFICE WITH ANY PROBLEMS OR QUESTIONS [phone])  **PLEASE NOTE** ALL MEDICATION REFILL REQUESTS (INCLUDING CONTROLLED SUBSTANCES) NEED TO BE MADE AT LEAST 7 DAYS PRIOR TO REFILL BEING DUE. ANY REFILL REQUESTS INSIDE THAT TIME FRAME MAY RESULT IN DELAYS IN RECEIVING YOUR PRESCRIPTION.

## 2024-06-28 NOTE — Progress Notes (Signed)
 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 This includes bleeding, bruising, infection, excessive weakness, or medication side effects. A REMS form is on file and signed.  bilateral Needle: 50mm injectable monopolar needle electrode  Number of units per muscle Pectoralis Major 0 units Pectoralis Minor 0 units Biceps 125 units bilaterally Brachioradialis 75 units bilaterally FCR 0 units FCU 0 units FDS 0 units FDP 0 units FPL 0 units Pronator Teres 0 units Pronator Quadratus 0 units Lumbricals 0 units Quadriceps 100 units bilaterally Gastroc/soleus 0 units Hamstrings 0 units Tibialis Posterior 0 units Tibialis Anterior 0 units EHL 0 units All injections were done after obtaining appropriate EMG activity and after negative drawback for blood. The patient tolerated the procedure well. Post procedure instructions were given. Return per famiy discretion for further botox .

## 2024-07-18 NOTE — Progress Notes (Unsigned)
 No chief complaint on file.   [employer] here for URI complaints.  Patient was seen in the office 06/23/2024, chest x-ray was negative.  He was treated with cefpodoxime  and doxycycline  empirically for pneumonia.  Patient was hospitalized twice for pneumonia over the past year.   Improvement following abx initial treatment.  Over the past week, the patient has developed worsening symptoms, including intense coughing, increased mucus production, yellow/white thic mucus, and fevers up to 102F over the last 5-6 da    Duration: {Numbers; 1-10:13787} {Time; day/wk/mo/yr:20843}  Associated symptoms: {URI Symptoms :210800001} Denies: {URI Symptoms :210800001} Treatment to date: *** Sick contacts: {yes/no:20286}  Past Medical History:  Diagnosis Date   Cerebral palsy (HCC)    Dehydration 11/22/2013   Depression with anxiety 08/01/2010   Qualifier: Diagnosis of  By: Nelson-Smith CMA (AAMA), Dottie     Dyslipidemia 08/19/2017   Esophagitis 2011   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   GERD (gastroesophageal reflux disease)    Hyperlipidemia, mild 08/25/2015   Hyperthyroidism    Incontinence of feces    Loss of weight 08/28/2014   Medicare annual wellness visit, subsequent 08/25/2015   Mildly underweight adult 03/16/2017   Palpitations    PALSY, INFANTILE CEREBRAL, 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 General: Awake, alert, appears stated age HEENT: AT, [place], ears patent b/l and TM's neg, nares patent w/o discharge, pharynx pink and without exudates, MMM Neck: No masses or asymmetry Heart: RRR Lungs: CTAB, no accessory muscle use Psych: Age appropriate judgment and  insight, normal mood and affect  No diagnosis found.  Continue to push fluids, practice good hand hygiene, cover mouth when coughing. F/u prn. If starting to experience fevers, shaking, or shortness of breath, seek immediate care. Pt voiced understanding and agreement to the plan.  [name], DNP, AGNP-C 07/18/24 3:26 PM

## 2024-07-20 ENCOUNTER — Encounter: Payer: Self-pay | Admitting: Student

## 2024-07-20 ENCOUNTER — Ambulatory Visit: Admitting: Student

## 2024-07-20 ENCOUNTER — Ambulatory Visit (INDEPENDENT_AMBULATORY_CARE_PROVIDER_SITE_OTHER): Admitting: Student

## 2024-07-20 VITALS — BP 116/70 | HR 95 | Temp 98.1°F | Resp 16 | Wt 92.0 lb

## 2024-07-20 DIAGNOSIS — G4700 Insomnia, unspecified: Secondary | ICD-10-CM

## 2024-07-20 DIAGNOSIS — R052 Subacute cough: Secondary | ICD-10-CM

## 2024-07-20 DIAGNOSIS — K219 Gastro-esophageal reflux disease without esophagitis: Secondary | ICD-10-CM

## 2024-07-20 DIAGNOSIS — R682 Dry mouth, unspecified: Secondary | ICD-10-CM

## 2024-07-20 DIAGNOSIS — R053 Chronic cough: Secondary | ICD-10-CM

## 2024-07-20 MED ORDER — OMEPRAZOLE 20 MG PO CPDR: 20.0000 mg | DELAYED_RELEASE_CAPSULE | Freq: Every day | ORAL | 3 refills | Status: DC

## 2024-07-20 MED ORDER — HYDROCODONE BIT-HOMATROP MBR 5-1.5 MG/5ML PO SOLN: 5.0000 mL | Freq: Four times a day (QID) | ORAL | 0 refills | Status: DC | PRN

## 2024-07-26 DIAGNOSIS — F339 Major depressive disorder, recurrent, unspecified: Secondary | ICD-10-CM | POA: Diagnosis not present

## 2024-07-26 DIAGNOSIS — G4700 Insomnia, unspecified: Secondary | ICD-10-CM | POA: Diagnosis not present

## 2024-07-27 ENCOUNTER — Other Ambulatory Visit: Payer: Self-pay

## 2024-07-27 ENCOUNTER — Emergency Department (HOSPITAL_BASED_OUTPATIENT_CLINIC_OR_DEPARTMENT_OTHER)

## 2024-07-27 ENCOUNTER — Ambulatory Visit: Payer: Self-pay

## 2024-07-27 ENCOUNTER — Encounter (HOSPITAL_BASED_OUTPATIENT_CLINIC_OR_DEPARTMENT_OTHER): Payer: Self-pay | Admitting: Emergency Medicine

## 2024-07-27 ENCOUNTER — Emergency Department (HOSPITAL_BASED_OUTPATIENT_CLINIC_OR_DEPARTMENT_OTHER)
Admission: EM | Admit: 2024-07-27 | Discharge: 2024-07-27 | Disposition: A | Discharge status: Home / Self Care | Attending: Emergency Medicine | Admitting: Emergency Medicine

## 2024-07-27 DIAGNOSIS — W449XXA Unspecified foreign body entering into or through a natural orifice, initial encounter: Secondary | ICD-10-CM | POA: Diagnosis not present

## 2024-07-27 DIAGNOSIS — J9811 Atelectasis: Secondary | ICD-10-CM | POA: Diagnosis not present

## 2024-07-27 DIAGNOSIS — R0789 Other chest pain: Secondary | ICD-10-CM | POA: Diagnosis not present

## 2024-07-27 DIAGNOSIS — R1084 Generalized abdominal pain: Secondary | ICD-10-CM | POA: Diagnosis not present

## 2024-07-27 DIAGNOSIS — G4700 Insomnia, unspecified: Secondary | ICD-10-CM | POA: Diagnosis not present

## 2024-07-27 DIAGNOSIS — J90 Pleural effusion, not elsewhere classified: Secondary | ICD-10-CM | POA: Diagnosis not present

## 2024-07-27 DIAGNOSIS — Z934 Other artificial openings of gastrointestinal tract status: Secondary | ICD-10-CM | POA: Diagnosis not present

## 2024-07-27 DIAGNOSIS — T17908A Unspecified foreign body in respiratory tract, part unspecified causing other injury, initial encounter: Secondary | ICD-10-CM | POA: Diagnosis not present

## 2024-07-27 DIAGNOSIS — R109 Unspecified abdominal pain: Secondary | ICD-10-CM | POA: Diagnosis not present

## 2024-07-27 DIAGNOSIS — J690 Pneumonitis due to inhalation of food and vomit: Secondary | ICD-10-CM | POA: Diagnosis not present

## 2024-07-27 DIAGNOSIS — F339 Major depressive disorder, recurrent, unspecified: Secondary | ICD-10-CM | POA: Diagnosis not present

## 2024-07-27 LAB — URINALYSIS, W/ REFLEX TO CULTURE (INFECTION SUSPECTED)
Bilirubin Urine: NEGATIVE
Glucose, UA: NEGATIVE mg/dL
Hgb urine dipstick: NEGATIVE
Ketones, ur: 80 mg/dL — AB
Leukocytes,Ua: NEGATIVE
Nitrite: NEGATIVE
Protein, ur: NEGATIVE mg/dL
Specific Gravity, Urine: 1.02 (ref 1.005–1.030)
pH: 7 (ref 5.0–8.0)

## 2024-07-27 LAB — HEPATIC FUNCTION PANEL
ALT: 13 U/L (ref 0–44)
AST: 22 U/L (ref 15–41)
Albumin: 4.9 g/dL (ref 3.5–5.0)
Alkaline Phosphatase: 80 U/L (ref 38–126)
Bilirubin, Direct: 0.1 mg/dL (ref 0.0–0.2)
Indirect Bilirubin: 0.2 mg/dL — ABNORMAL LOW (ref 0.3–0.9)
Total Bilirubin: 0.3 mg/dL (ref 0.0–1.2)
Total Protein: 7.3 g/dL (ref 6.5–8.1)

## 2024-07-27 LAB — CBC
HCT: 43.3 % (ref 39.0–52.0)
Hemoglobin: 14.7 g/dL (ref 13.0–17.0)
MCH: 30.9 pg (ref 26.0–34.0)
MCHC: 33.9 g/dL (ref 30.0–36.0)
MCV: 91.2 fL (ref 80.0–100.0)
Platelets: 216 K/uL (ref 150–400)
RBC: 4.75 MIL/uL (ref 4.22–5.81)
RDW: 13.1 % (ref 11.5–15.5)
WBC: 8.1 K/uL (ref 4.0–10.5)
nRBC: 0 % (ref 0.0–0.2)

## 2024-07-27 LAB — BASIC METABOLIC PANEL WITH GFR
Anion gap: 13 (ref 5–15)
BUN: 8 mg/dL (ref 6–20)
CO2: 27 mmol/L (ref 22–32)
Calcium: 9.7 mg/dL (ref 8.9–10.3)
Chloride: 97 mmol/L — ABNORMAL LOW (ref 98–111)
Creatinine, Ser: <0.3 mg/dL — ABNORMAL LOW (ref 0.61–1.24)
Glucose, Bld: 84 mg/dL (ref 70–99)
Potassium: 3.7 mmol/L (ref 3.5–5.1)
Sodium: 136 mmol/L (ref 135–145)

## 2024-07-27 LAB — LIPASE, BLOOD: Lipase: 30 U/L (ref 11–51)

## 2024-07-27 LAB — LACTIC ACID, PLASMA: Lactic Acid, Venous: 0.7 mmol/L (ref 0.5–1.9)

## 2024-07-27 LAB — TROPONIN T, HIGH SENSITIVITY
Troponin T High Sensitivity: 18 ng/L (ref 0–19)
Troponin T High Sensitivity: 19 ng/L (ref 0–19)

## 2024-07-27 MED ORDER — FENTANYL CITRATE PF 50 MCG/ML IJ SOSY
50.0000 ug | PREFILLED_SYRINGE | Freq: Once | INTRAMUSCULAR | Status: AC
  Administered 2024-07-27: 50 ug via INTRAVENOUS
  Filled 2024-07-27: qty 1

## 2024-07-27 MED ORDER — IOHEXOL 300 MG/ML  SOLN
70.0000 mL | Freq: Once | INTRAMUSCULAR | Status: AC | PRN
  Administered 2024-07-27: 90 mL via INTRAVENOUS

## 2024-07-27 MED ORDER — SODIUM CHLORIDE 0.9 % IV BOLUS
500.0000 mL | Freq: Once | INTRAVENOUS | Status: AC
  Administered 2024-07-27: 500 mL via INTRAVENOUS

## 2024-07-27 NOTE — ED Notes (Addendum)
 Pt transported to CT at this time.

## 2024-07-27 NOTE — Discharge Instructions (Addendum)
 Make an appointment to have close follow-up with your primary care doctor.  You can discuss with your pulmonologist regarding the ongoing aspiration issues.  Keep a close eye out for any worsening symptoms such as fever, worsening cough, shortness of breath, vomiting or other worsening symptoms.  Return to the emergency room for any worsening symptoms.

## 2024-07-27 NOTE — ED Provider Notes (Signed)
  EMERGENCY DEPARTMENT AT [facility] Provider Note   CSN: [id number] Arrival date & time: 07/27/24  1501     Patient presents with: Chest Pain and Abdominal Pain   [name] is a 38 y.o. male.   Patient is a 38 year old male with a history of cerebral palsy with spastic paralysis, G-tube placement and recurrent pneumonia.  He presents with irritability and abdominal pain.  He is here with his mom who helps provide history.  He currently lives in a group home.  He has been crying at night for the last 2 days.  He has not been getting much sleep.  He has been complaining of some chest pain and also some abdominal pain.  He has had a little coughing but it seems to be ongoing congestion from recently treated pneumonia.  He has not had any recent fevers.  No vomiting.  He does have a condom cath in place and has had good urine output.  He has had some constipation without a bowel movement since Monday which is a little atypical for him.  Mom says he ran out of his probiotic and recently just got it in.  Per chart review, he was most recently treated for pneumonia on August 22 with Vantin  and doxycycline , he was treated previous to that in July 24 with Zithromax  and was admitted to the hospital for pneumonia in May of this year.       Prior to Admission medications   Medication Sig Start Date End Date Taking? Authorizing Provider  albuterol  (PROVENTIL ) (2.5 MG/3ML) 0.083% nebulizer solution Take 3 mLs (2.5 mg total) by nebulization every 4 (four) hours as needed for wheezing or shortness of breath. 01/20/24   Almarie Waddell NOVAK, NP  clotrimazole -betamethasone  (LOTRISONE ) cream Apply 1 Application topically 2 (two) times daily as needed. 03/01/24   Almarie Waddell NOVAK, NP  dantrolene  (DANTRIUM ) 100 MG capsule Take 1 capsule (100 mg total) by mouth 3 (three) times daily. Patient taking differently: Place 100 mg into feeding tube 3 (three) times daily. 01/12/24   Babs Arthea DASEN, MD   DEPAKOTE  SPRINKLES 125 MG capsule Take 250-375 mg by mouth See admin instructions. Take 375mg  (3 capsules) by mouth at 0900, 250mg  (2 capsules) at 2030, and 250mg  (2 capsules) at 2130. G tube 06/13/21   [provider]  Dextromethorphan -guaiFENesin  5-100 MG/5ML LIQD Give 20 mLs by tube every 6 (six) hours as needed (Cough).    [provider]  diazepam  (VALIUM ) 5 MG tablet Place 5 mg into feeding tube daily as needed for anxiety.    [provider]  doxepin  (SINEQUAN ) 25 MG capsule Place 25 mg into feeding tube at bedtime. 04/24/23   [provider]  escitalopram  (LEXAPRO ) 5 MG/5ML solution Place 20 mLs into feeding tube daily. 03/24/23   [provider]  HYDROcodone  bit-homatropine (HYDROMET) 5-1.5 MG/5ML syrup Take 5 mLs by mouth every 6 (six) hours as needed for cough. 07/20/24   Yacopino, Jessica L, NP  lamoTRIgine  (LAMICTAL ) 25 MG tablet TAKE 2 TABLETS BY MOUTH 2 TIMES DAILY. Patient taking differently: Place 50 mg into feeding tube 2 (two) times daily. 08/09/23   Onita Duos, MD  loperamide  HCl (IMODIUM  A-D) 1 MG/7.5ML solution Place 15 mLs (2 mg total) into feeding tube as needed for diarrhea or loose stools. 03/06/24   Gonfa, Taye T, MD  LORazepam  (ATIVAN ) 1 MG tablet Place 1 mg into feeding tube at bedtime. 02/24/23   [provider]  Nutritional  Supplements (FEEDING SUPPLEMENT, KATE FARMS STANDARD 1.4,) LIQD liquid Take 325 mLs by mouth as directed. 3 carton over 16 hrs    [provider]  OLANZapine  (ZYPREXA ) 10 MG tablet Place 10 mg into feeding tube at bedtime. 06/11/22   [provider]  OLANZapine  (ZYPREXA ) 5 MG tablet Place 5 mg into feeding tube daily. @1700  02/12/21   [provider]  omeprazole  (PRILOSEC) 20 MG capsule Take 1 capsule (20 mg total) by mouth daily. 07/20/24   Wheeler Harlene CROME, NP  OVER THE COUNTER MEDICATION Give 1 capsule by tube 2 (two) times daily. Muscle Calm Formula    [provider]  OVER THE COUNTER MEDICATION Give 1 capsule by tube at bedtime. CBD/Melaonin    [provider]  oxyBUTYnin  (DITROPAN ) 5 MG/5ML solution Place 7.5 mg into feeding tube at bedtime. 03/31/23   [provider]  Saccharomyces boulardii (FLORASTOR PO) Give 1 capsule by tube every evening.    [provider]  sucralfate  (CARAFATE ) 1 g tablet Place 1 tablet (1 g total) into feeding tube daily. 05/02/24   Pyrtle, Gordy HERO, MD  traZODone  (DESYREL ) 150 MG tablet Place 150 mg into feeding tube at bedtime. 12/31/23   [provider]    Allergies: Ambien  [zolpidem  tartrate]; Antihistamines, chlorpheniramine-type; Augmentin  [amoxicillin -pot clavulanate]; Cefdinir ; Codeine ; Zolpidem ; Baclofen ; Metoclopramide ; Pheniramine; Sulfa antibiotics; and Sulfonamide derivatives    Review of Systems  Unable to perform ROS: Patient nonverbal    Updated Vital Signs BP 122/82 (BP Location: Left Arm)   Pulse 89   Temp 98.3 F (36.8 C) (Oral)   Resp 20   Ht 5' (1.524 m)   Wt 41.7 kg   SpO2 100%   BMI 17.95 kg/m   Physical Exam Constitutional:      Appearance: He is well-developed.  HENT:     Head: Normocephalic and atraumatic.  Eyes:     Pupils: Pupils are equal, round, and reactive to light.  Cardiovascular:     Rate and Rhythm: Normal rate and regular rhythm.     Heart sounds: Normal heart sounds.  Pulmonary:     Effort: Pulmonary effort is normal. No respiratory distress.     Breath sounds: Normal breath sounds. No wheezing or rales.     Comments: Positive tenderness on palpation of the chest wall, he has an area of yellowing ecchymosis in the center of his chest over the sternum (he doesn't report any recent falls) Chest:     Chest wall: No tenderness.  Abdominal:     General: Bowel sounds are normal.     Palpations: Abdomen is soft.     Tenderness: There is abdominal tenderness (Generalized tenderness across the abdomen). There is no guarding or rebound.      Comments: G-tube in place, he has a little bit of erythema at the stoma  Genitourinary:    Comments: Condom catheter in place with yellow urine drainage Musculoskeletal:        General: Normal range of motion.     Cervical back: Normal range of motion and neck supple.  Lymphadenopathy:     Cervical: No cervical adenopathy.  Skin:    General: Skin is warm and dry.     Findings: No rash.  Neurological:     Mental Status: He is alert and oriented to person, place, and time.     (all labs ordered are listed, but only abnormal results are displayed) Labs Reviewed  BASIC METABOLIC PANEL WITH GFR - Abnormal; Notable  for the following components:      Result Value   Chloride 97 (*)    Creatinine, Ser <0.30 (*)    All other components within normal limits  HEPATIC FUNCTION PANEL - Abnormal; Notable for the following components:   Indirect Bilirubin 0.2 (*)    All other components within normal limits  URINALYSIS, W/ REFLEX TO CULTURE (INFECTION SUSPECTED) - Abnormal; Notable for the following components:   APPearance CLOUDY (*)    Ketones, ur 80 (*)    Bacteria, UA FEW (*)    All other components within normal limits  URINE CULTURE  CBC  LIPASE, BLOOD  LACTIC ACID, PLASMA  TROPONIN T, HIGH SENSITIVITY  TROPONIN T, HIGH SENSITIVITY    EKG: EKG Interpretation Date/Time:  Thursday July 27 2024 15:11:03 EDT Ventricular Rate:  87 PR Interval:  132 QRS Duration:  78 QT Interval:  334 QTC Calculation: 402 R Axis:   100  Text Interpretation: Sinus rhythm Probable lateral infarct, old Borderline ST elevation, anterior leads since last tracing no significant change Confirmed by Lenor Hollering [id number]) on 07/27/2024 3:31:19 PM  Radiology: CT CHEST ABDOMEN PELVIS W CONTRAST Result Date: 07/27/2024 CLINICAL DATA:  Chest and abdominal pain. History of cerebral palsy. Recurrent pneumonia. Abdominal pain. EXAM: CT CHEST, ABDOMEN, AND PELVIS WITH CONTRAST TECHNIQUE: Multidetector CT  imaging of the chest, abdomen and pelvis was performed following the standard protocol during bolus administration of intravenous contrast. RADIATION DOSE REDUCTION: This exam was performed according to the departmental dose-optimization program which includes automated exposure control, adjustment of the mA and/or kV according to patient size and/or use of iterative reconstruction technique. CONTRAST:  90mL OMNIPAQUE  IOHEXOL  300 MG/ML  SOLN COMPARISON:  CT chest 03/03/2024.  CT abdomen and pelvis 11/23/2014. FINDINGS: CT CHEST FINDINGS Cardiovascular: No significant vascular findings. Normal heart size. No pericardial effusion. Mediastinum/Nodes: No enlarged mediastinal, hilar, or axillary lymph nodes. Thyroid  gland, trachea, and esophagus demonstrate no significant findings. Lungs/Pleura: There some new occlusive secretions in the right mainstem bronchus extending into right lower lobe bronchi. There is new right lower lobe peribronchial wall thickening. Presumed rounded atelectasis in the right lower lobe appears unchanged from the prior examination. There is a small right pleural effusion which is also unchanged. No new focal lung infiltrate or pneumothorax. Musculoskeletal: Extensive thoracic spinal fusion hardware is again seen. Catheter seen in the soft tissues of the posterior right back, unchanged. There is scoliosis of the thoracic spine. No acute fractures are seen. CT ABDOMEN PELVIS FINDINGS Hepatobiliary: No focal liver abnormality is seen. No gallstones, gallbladder wall thickening, or biliary dilatation. Pancreas: Unremarkable. No pancreatic ductal dilatation or surrounding inflammatory changes. Spleen: Normal in size without focal abnormality. Adrenals/Urinary Tract: There are rounded hypodensities in both kidneys which are too small to characterize, likely cysts. Evaluation of the kidneys is limited secondary to streak artifact from the spine. There is no hydronephrosis or perinephric fluid. The  bilateral adrenal glands are within normal limits. Bladder is decompressed. Stomach/Bowel: Percutaneous gastro jejunostomy tube in place with distal catheter tip in mid jejunum. No dilated bowel loops are seen. There is no focal wall thickening or inflammation. The appendix is not visualized. There are few scattered sigmoid colon diverticula. Vascular/Lymphatic: No significant vascular findings are present. No enlarged abdominal or pelvic lymph nodes. Reproductive: Prostate is unremarkable. Other: No abdominal wall hernia or abnormality. No abdominopelvic ascites. Musculoskeletal: Extensive lumbar fusion hardware present. There is scoliosis of the lumbar spine. IMPRESSION: 1. New occlusive secretions in the right mainstem  bronchus extending into right lower lobe bronchi with new right lower lobe peribronchial wall thickening. Findings are compatible with aspiration. 2. Stable small right pleural effusion. 3. Stable rounded atelectasis in the right lower lobe. 4. No acute localizing process in the abdomen or pelvis. 5. Percutaneous gastro jejunostomy tube in place. Electronically Signed   By: Greig Pique M.D.   On: 07/27/2024 19:27     Procedures   Medications Ordered in the ED  fentaNYL  (SUBLIMAZE ) injection 50 mcg (50 mcg Intravenous Given 07/27/24 1650)  sodium chloride  0.9 % bolus 500 mL (0 mLs Intravenous Stopped 07/27/24 1750)  iohexol  (OMNIPAQUE ) 300 MG/ML solution 70 mL (90 mLs Intravenous Contrast Given 07/27/24 1848)  fentaNYL  (SUBLIMAZE ) injection 50 mcg (50 mcg Intravenous Given 07/27/24 1939)                                    Medical Decision Making Amount and/or Complexity of Data Reviewed Labs: ordered. Radiology: ordered.  Risk Prescription drug management.   This patient presents to the ED for concern of chest pain, abdominal pain, this involves an extensive number of treatment options, and is a complaint that carries with it a high risk of complications and morbidity.  I  considered the following differential and admission for this acute, potentially life threatening condition.  The differential diagnosis includes pneumonia, bowel obstruction, colitis, pancreatitis, cholecystitis, pyelonephritis/UTI  MDM:    Patient is a 38 year old with a history of cerebral palsy who presents with irritability and difficulty sleeping.  He has been indicating to his mom that he has pain in his chest and abdomen.  He has some communication abilities with his mom but is essentially nonverbal.  Labs are reviewed and are nonconcerning.  His urine which is from a condom cath had some mild bacteria but he is negative nitrate, negative leukocyte esterase.  Does not appear to be consistent with infection.  Will send for culture.  CT scan of the abdomen pelvis along with a chest showed some aspiration in his upper airways.  No new evidence of pneumonia.  Stable pleural effusion.  No other acute abnormality in his abdomen.  Discussed findings with mom.  Discussed that we could admit him overnight for observation and watch his lung status as well as to repeat abdominal exam.  He has some mild erythema around his G-tube but it does not overtly look infected.  There is no drainage.  Mom states that he has had similar episodes in the past and it does not look any more concerning than it has before.  She does not want him admitted to the hospital.  She says that he does better at home.  He is not febrile.  He is not hypoxic.  He has chronic aspiration issues and uses a vest to help him clear his secretions as well as suctioning.  This does not appear to be a new finding.  I do not see any source of infection that he would need to be started on antibiotics.  He has been on a lot of antibiotics recently and I am hesitant to just prophylactically prescribe antibiotics.  Discussed this with mom.  Also offered to consult with the pulmonologist on-call but she said that she can call his pulmonologist tomorrow and  they have an upcoming appointment with pulmonology.  She is comfortable with his care at home.  She said he has been through this in the  past and she will bring him back if he has any changes or worsening symptoms.  He was discharged home in good condition.  Will follow-up with his primary care doctor or pulmonologist.  (Labs, imaging, consults)  Labs: I Ordered, and personally interpreted labs.  The pertinent results include: Normal white count, urine had some bacteria but no other concerns for infection.  No signs of pancreatitis.  LFTs are normal.  Imaging Studies ordered: I ordered imaging studies including CT chest abdomen pelvis I independently visualized and interpreted imaging. I agree with the radiologist interpretation  Additional history obtained from mom.  External records from outside source obtained and reviewed including history  Cardiac Monitoring: The patient was maintained on a cardiac monitor.  If on the cardiac monitor, I personally viewed and interpreted the cardiac monitored which showed an underlying rhythm of: Sinus rhythm  Reevaluation: After the interventions noted above, I reevaluated the patient and found that they have :improved  Social Determinants of Health:  limited ability to communicate  Disposition: Discharged to home  Co morbidities that complicate the patient evaluation  Past Medical History:  Diagnosis Date   Cerebral palsy (HCC)    Dehydration 11/22/2013   Depression with anxiety 08/01/2010   Qualifier: Diagnosis of  By: Marcelo CMA (AAMA), Dottie     Dyslipidemia 08/19/2017   Esophagitis 2011   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   GERD (gastroesophageal reflux disease)    Hyperlipidemia, mild 08/25/2015   Hyperthyroidism    Incontinence of feces    Loss of weight 08/28/2014   Medicare annual wellness visit, subsequent 08/25/2015   Mildly underweight adult 03/16/2017   Palpitations    PALSY, INFANTILE CEREBRAL, 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   Qualifier: Diagnosis of  By: Antonio ROSALEA Rockers  Working with PMR Dr Arlana and tolerating Botox  injections in hips, shoulders, etc with good results   Skin lesion of right ear 03/16/2017   Thyroid  disease 08/01/2010   Qualifier: Diagnosis of  By: Marcelo CMA (AAMA), Dottie       Medicines Meds ordered this encounter  Medications   fentaNYL  (SUBLIMAZE ) injection 50 mcg   sodium chloride  0.9 % bolus 500 mL   iohexol  (OMNIPAQUE ) 300 MG/ML solution 70 mL   fentaNYL  (SUBLIMAZE ) injection 50 mcg    I have reviewed the patients home medicines and have made adjustments as needed  Problem List / ED Course: Problem List Items Addressed This Visit   None Visit Diagnoses       Generalized abdominal pain    -  Primary     Aspiration into airway, initial encounter                    Final diagnoses:  Generalized abdominal pain  Aspiration into airway, initial encounter    ED Discharge Orders     None          Lenor Hollering, MD 07/27/24 2046

## 2024-07-27 NOTE — Telephone Encounter (Signed)
 FYI Only or Action Required?: FYI only for provider.  Patient was last seen in primary care on 07/20/2024 by Wheeler Harlene CROME, NP.  Called Nurse Triage reporting Abdominal Pain.  Symptoms began several days ago.  Interventions attempted: Nothing.  Symptoms are: rapidly worsening.  Triage Disposition: Go to ED Now (Notify PCP)  Patient/caregiver understands and will follow disposition?: Yes     Copied from CRM [id number]. Topic: Clinical - Red Word Triage >> Jul 27, 2024  1:29 PM Rosina BIRCH wrote: Reason for CRM: patient POA called stating the patient is agitated, crying and screaming throughout the day and not sleeping. Patient complain of his lower abdomen hurting and it feels better when they stop his tube feed. Patient POA stated it has been a few days since he has had a bowel movement Reason for Disposition  [1] SEVERE pain (e.g., excruciating) AND [2] present > 1 hour  Answer Assessment - Initial Assessment Questions 1. LOCATION: Where does it hurt?      Lower abd 2. RADIATION: Does the pain shoot anywhere else? (e.g., chest, back)     Has been complaining chest hurts too. 3. ONSET: When did the pain begin? (Minutes, hours or days ago)      Last week 4. SUDDEN: Gradual or sudden onset?     Suddenly and he asked that tube feeding be stopped, states he keeps saying 911 5. PATTERN Does the pain come and go, or is it constant?     constant 6. SEVERITY: How bad is the pain?  (e.g., Scale 1-10; mild, moderate, or severe)     severe 7. RECURRENT SYMPTOM: Have you ever had this type of stomach pain before? If Yes, ask: When was the last time? and What happened that time?      denies 8. CAUSE: What do you think is causing the stomach pain? (e.g., gallstones, recent abdominal surgery)     unknown 9. RELIEVING/AGGRAVATING FACTORS: What makes it better or worse? (e.g., antacids, bending or twisting motion, bowel movement)     none 10. OTHER SYMPTOMS: Do you  have any other symptoms? (e.g., back pain, diarrhea, fever, urination pain, vomiting)       Severe pain  Protocols used: Abdominal Pain - Male-A-AH

## 2024-07-27 NOTE — ED Triage Notes (Signed)
 Pt with mother from [facility]- mother was informed pt was not sleeping Tuesday night, pt crying yesterday, c/o chest pain,   Also with lingering congested cough x 4 weeks.   C/o generalized abd pain, pain at insertion site of GJ tube today. Denies vomiting.

## 2024-07-27 NOTE — ED Notes (Signed)
 Repeat troponin level delayed d/t pt being at CT

## 2024-07-28 ENCOUNTER — Ambulatory Visit: Payer: Self-pay | Admitting: Pulmonary Disease

## 2024-07-28 MED ORDER — AMOXICILLIN-POT CLAVULANATE 875-125 MG PO TABS: 1.0000 | ORAL_TABLET | Freq: Two times a day (BID) | ORAL | 0 refills | Status: DC

## 2024-07-28 MED ORDER — CEFPODOXIME PROXETIL 200 MG PO TABS: 200.0000 mg | ORAL_TABLET | Freq: Two times a day (BID) | ORAL | 0 refills | Status: DC

## 2024-07-28 MED ORDER — AZITHROMYCIN 250 MG PO TABS: ORAL_TABLET | ORAL | 0 refills | Status: DC

## 2024-07-28 NOTE — Telephone Encounter (Signed)
 Copied from CRM #[id number]. Topic: Clinical - Prescription Issue >> Jul 28, 2024 11:09 AM Isabell A wrote: Reason for CRM: Corean from [facility] states amoxicillin -clavulanate (AUGMENTIN ) 875-125 MG tablet is on his allergy list - causes diarhhea. Corean would like to confirm if this is still ok to release to the patient.  Callback number: [phone]  Verbally spoke with Dr. Theophilus and he suggested not to take Augmentin  since it does cause side effects.  Per Dr. Theophilus he would like pt to take Vantin  instead. Called and spoke with the patients mother (DPR) and advised of this information and CT results, she advised she would not like him to take Augmentin . Pts mom is aware of rx change and sent to pharmacy. Called and spoke with [facility] pharmacists and cancelled rx for augmentin .  Nothing further needed.

## 2024-07-28 NOTE — Telephone Encounter (Signed)
 FYI Only or Action Required?: Action required by provider: was seen at the ED yesterday-XR showed aspiration but no PNA. Mother is concerned with patient's cough with yellow sputum. No antibiotic given to go home with from the ED. Mother is concerned going into the weekend with no antibiotic. Asking for an antibiotic to be sent into the pharmacy. Requested message be sent to both pulmonary and primary office. Mother would like a phone call back .  Patient is followed in Pulmonology for recurrent PNA, last seen on 04/10/2024 by Mannam, Praveen, MD.  Called Nurse Triage reporting Cough.  Symptoms began yesterday.  Interventions attempted: Nebulizer treatments.  Symptoms are: unchanged.  Triage Disposition: See Physician Within 24 Hours  Patient/caregiver understands and will follow disposition?:   Copied from CRM #[id number]. Topic: Clinical - Red Word Triage >> Jul 28, 2024  8:31 AM [name] wrote: Red Word that prompted transfer to Nurse Triage:   Mother [name] is calling Pt was in ER yesterday and today History of cerebral palsy and high risk for aspiration Chest x-ray showed aspiration but not pneuomnia Was not prescribed antibiotic Is coughing frequently and has yellow phlegm present Performing suction frequently  Pt of Dr. Theophilus Reason for Disposition  SEVERE coughing spells (e.g., whooping sound after coughing, vomiting after coughing)  Answer Assessment - Initial Assessment Questions Patient was seen in ED yesterday. XR showed aspiration but no pneumonia. Mother states patient has been coughing severely since yesterday. Yellow sputum. Mother states he wasn't given an antibiotic. Asking for an antibiotic to avoid ending up back in the ED. Requesting message be sent to both pulmonary and primary offices.   1. ONSET: When did the cough begin?      Started either Wednesday or Thursday per mother 2. SEVERITY: How bad is the cough today?      severe 3. SPUTUM: Describe the  color of your sputum (e.g., none, dry cough; clear, white, yellow, green)     yellow 4. HEMOPTYSIS: Are you coughing up any blood? If Yes, ask: How much? (e.g., flecks, streaks, tablespoons, etc.)     no 5. DIFFICULTY BREATHING: Are you having difficulty breathing? If Yes, ask: How bad is it? (e.g., mild, moderate, severe)      no 6. FEVER: Do you have a fever? If Yes, ask: What is your temperature, how was it measured, and when did it start?     no 7. CARDIAC HISTORY: Do you have any history of heart disease? (e.g., heart attack, congestive heart failure)      no 8. LUNG HISTORY: Do you have any history of lung disease?  (e.g., pulmonary embolus, asthma, emphysema)     PNA 9. PE RISK FACTORS: Do you have a history of blood clots? (or: recent major surgery, recent prolonged travel, bedridden)     no 10. OTHER SYMPTOMS: Do you have any other symptoms? (e.g., runny nose, wheezing, chest pain)       Having to be suctioned frequently.  12. TRAVEL: Have you traveled out of the country in the last month? (e.g., travel history, exposures)       no  Protocols used: Cough - Acute Productive-A-AH

## 2024-07-28 NOTE — Telephone Encounter (Signed)
 Reviewed the CT chest which does not show new pneumonia Advise to avoid antibiotics unless really needed as he can develop resistance from multiple rounds of treatment I have sent in a prescription for Augmentin  and Z-Pak.  Please use only if his symptoms get worse with fevers.

## 2024-07-29 LAB — URINE CULTURE: Culture: NO GROWTH

## 2024-07-31 ENCOUNTER — Telehealth: Payer: Self-pay

## 2024-07-31 DIAGNOSIS — G4700 Insomnia, unspecified: Secondary | ICD-10-CM | POA: Diagnosis not present

## 2024-07-31 DIAGNOSIS — F339 Major depressive disorder, recurrent, unspecified: Secondary | ICD-10-CM | POA: Diagnosis not present

## 2024-07-31 NOTE — Telephone Encounter (Signed)
 Copied from CRM [id number]. Topic: General - Other >> Jul 28, 2024  4:05 PM Roselie BROCKS wrote: Reason for CRM: [facility] is calling to confirm clinic has received the faxed paperwork for catheter  [facility] [phone] if have not received fax

## 2024-07-31 NOTE — Telephone Encounter (Signed)
 Forms was faxed back to [facility].

## 2024-08-03 ENCOUNTER — Encounter: Payer: Self-pay | Admitting: Pulmonary Disease

## 2024-08-03 ENCOUNTER — Ambulatory Visit: Admitting: Pulmonary Disease

## 2024-08-03 VITALS — BP 100/70 | HR 92 | Temp 98.1°F | Ht 60.0 in | Wt 91.0 lb

## 2024-08-03 DIAGNOSIS — J690 Pneumonitis due to inhalation of food and vomit: Secondary | ICD-10-CM

## 2024-08-03 DIAGNOSIS — R197 Diarrhea, unspecified: Secondary | ICD-10-CM

## 2024-08-03 DIAGNOSIS — F32A Depression, unspecified: Secondary | ICD-10-CM

## 2024-08-03 DIAGNOSIS — J189 Pneumonia, unspecified organism: Secondary | ICD-10-CM

## 2024-08-03 MED ORDER — AZITHROMYCIN 250 MG PO TABS: ORAL_TABLET | ORAL | 0 refills | Status: DC

## 2024-08-03 MED ORDER — CEFPODOXIME PROXETIL 200 MG PO TABS: 200.0000 mg | ORAL_TABLET | Freq: Two times a day (BID) | ORAL | 0 refills | Status: DC

## 2024-08-03 NOTE — Patient Instructions (Signed)
  VISIT SUMMARY: During your visit, we addressed your recurrent aspiration pneumonia, antibiotic-associated diarrhea, and depression management. We discussed your recent respiratory issues, medication management concerns, and the steps to prevent future complications.  YOUR PLAN: RECURRENT ASPIRATION PNEUMONIA: You have recurrent episodes of pneumonia due to aspiration, with recent symptoms including fever and yellow sputum. -We have sent prescriptions for cefpodoxime  and azithromycin  to keep on file for future use. -Continue using the airway clearance vest and suctioning after vest use. -Use the cough assist device regularly. -Monitor for signs of pneumonia and start antibiotics as needed.  ANTIBIOTIC-ASSOCIATED DIARRHEA: You developed diarrhea during your recent antibiotic treatment. -Continue taking probiotics and Imodium  as needed during antibiotic courses.  DEPRESSION: There was a medication error where you received the wrong medication, leading to increased distress and crying. -Ensure Lexapro  is dispensed in tablet form and crushed for administration via your feeding tube.                                 Contains text generated by [employer].

## 2024-08-03 NOTE — Progress Notes (Signed)
 [name]    [id number]    Feb 10, 1986  Primary Care Physician:Blyth, Harlene LABOR, MD  Referring Physician: Domenica Harlene LABOR, MD [address] [place],  [place] [address]  Chief complaint: Follow up for recurrent pneumonias  HPI: 38 year old with history of cerebral palsy, recurrent pneumonia is due to poor secretion clearance. He is wheelchair-bound and needs assistance for all activities of daily living. History notable for T9 - C6 spine fusion at [facility] in September 2011, peg tube placed in in 2012 for hydration.  He has previously followed with Dr. Jude but not seen back in clinic for the past four years. He percussion vest at home and suction devices to help with mucus related clearance. He also uses Mucinex  DS. Followed by palliative care at home with a DNR order. Family wishes that he stay out of the hospital as much as possible as he has difficult time with hospitalizations and would prefer to do as much of medical care at home  The patient has a history of recurrent respiratory infections, with intervals of approximately six to eight weeks between episodes. The patient does not eat orally due to aspiration risk, and receives nutrition via a jejunal feeding tube. Despite this, there is concern that the patient may be aspirating saliva, contributing to the recurrent respiratory infections.   He had hospitalization in April 2025 for right lower lobe pneumonia and UTI with staph hominis and blood culture and then readmitted in May for persistent right lower lobe infiltrate with CT scan showing small loculated right pleural effusion with rounded atelectasis.  Treated with IV meropenem  and vancomycin .  PCCM was consult started and felt that there was not enough fluid to safely do a thoracentesis.  Discharged on p.o. Levaquin  for 2 weeks.  Overall feeling better and back to baseline.  Interim history Discussed the use of AI scribe software for clinical note transcription with the  patient, who gave verbal consent to proceed.  History of Present Illness [name] is a 38 year old male with recurrent pneumonias due to aspiration who presents with recent respiratory issues and medication management concerns. He is accompanied by his caregivers, who are his parents.  Recurrent aspiration pneumonia and respiratory symptoms - Recurrent episodes of pneumonia attributed to aspiration. - Recent onset of fever and worsening respiratory distress. - Yellow sputum present, improved following antibiotic therapy. - CT scan on September 25th during an ED visit revealed secretions in the right main bronchus without evidence of consolidation. - Airway clearance managed with vest and cough assist device. - Regular suctioning performed by caregivers to manage secretions.  Antibiotic therapy and gastrointestinal side effects - Recent treatment with azithromycin  and cefpodoxime  for respiratory infection. - Development of diarrhea during antibiotic course. - Diarrhea managed with Imodium  and probiotics.  Medication administration error and neuropsychiatric symptoms - Received oxybutynin  instead of Lexapro  for three weeks due to medication error. - Increased agitation and crying during this period, which exacerbated aspiration and respiratory symptoms when upset. - Medication error resolved by switching to tablet forms.  Enteral feeding tube management - Feeding tube last changed in May. - Tube replacement scheduled for November. - Previous discomfort attributed to the feeding tube.   Outpatient Encounter Medications as of 08/03/2024  Medication Sig   albuterol  (PROVENTIL ) (2.5 MG/3ML) 0.083% nebulizer solution Take 3 mLs (2.5 mg total) by nebulization every 4 (four) hours as needed for wheezing or shortness of breath.   cefpodoxime  (VANTIN ) 200 MG tablet  Take 1 tablet (200 mg total) by mouth 2 (two) times daily.   dantrolene  (DANTRIUM ) 100 MG capsule Take 1 capsule (100 mg  total) by mouth 3 (three) times daily.   DEPAKOTE  SPRINKLES 125 MG capsule Take 250-375 mg by mouth See admin instructions. Take 375mg  (3 capsules) by mouth at 0900, 250mg  (2 capsules) at 2030, and 250mg  (2 capsules) at 2130. G tube   Dextromethorphan -guaiFENesin  5-100 MG/5ML LIQD Give 20 mLs by tube every 6 (six) hours as needed (Cough).   diazepam  (VALIUM ) 5 MG tablet Place 5 mg into feeding tube daily as needed for anxiety.   doxepin  (SINEQUAN ) 25 MG capsule Place 25 mg into feeding tube at bedtime.   escitalopram  (LEXAPRO ) 5 MG/5ML solution Place 20 mLs into feeding tube daily.   lamoTRIgine  (LAMICTAL ) 25 MG tablet TAKE 2 TABLETS BY MOUTH 2 TIMES DAILY.   loperamide  HCl (IMODIUM  A-D) 1 MG/7.5ML solution Place 15 mLs (2 mg total) into feeding tube as needed for diarrhea or loose stools.   LORazepam  (ATIVAN ) 1 MG tablet Place 1 mg into feeding tube at bedtime.   Nutritional Supplements (FEEDING SUPPLEMENT, KATE FARMS STANDARD 1.4,) LIQD liquid Take 325 mLs by mouth as directed. 3 carton over 16 hrs   OLANZapine  (ZYPREXA ) 10 MG tablet Place 10 mg into feeding tube at bedtime.   OLANZapine  (ZYPREXA ) 5 MG tablet Place 5 mg into feeding tube daily. @1700    omeprazole  (PRILOSEC) 20 MG capsule Take 1 capsule (20 mg total) by mouth daily.   OVER THE COUNTER MEDICATION Give 1 capsule by tube 2 (two) times daily. Muscle Calm Formula   OVER THE COUNTER MEDICATION Give 1 capsule by tube at bedtime. CBD/Melaonin   oxyBUTYnin  (DITROPAN ) 5 MG/5ML solution Place 7.5 mg into feeding tube at bedtime.   Saccharomyces boulardii (FLORASTOR PO) Give 1 capsule by tube every evening.   sucralfate  (CARAFATE ) 1 g tablet Place 1 tablet (1 g total) into feeding tube daily.   traZODone  (DESYREL ) 150 MG tablet Place 150 mg into feeding tube at bedtime. (Patient taking differently: Place 150 mg into feeding tube at bedtime. Taking 225)   amoxicillin -clavulanate (AUGMENTIN ) 875-125 MG tablet Take 1 tablet by mouth 2 (two)  times daily. (Patient not taking: Reported on 08/03/2024)   azithromycin  (ZITHROMAX ) 250 MG tablet Take 500 mg on day 1 and then 250 mg/day for the next 4 days (Patient not taking: Reported on 08/03/2024)   clotrimazole -betamethasone  (LOTRISONE ) cream Apply 1 Application topically 2 (two) times daily as needed. (Patient not taking: Reported on 08/03/2024)   HYDROcodone  bit-homatropine (HYDROMET) 5-1.5 MG/5ML syrup Take 5 mLs by mouth every 6 (six) hours as needed for cough. (Patient not taking: Reported on 08/03/2024)   No facility-administered encounter medications on file as of 08/03/2024.   Vitals:   08/03/24 1408  BP: 100/70  Pulse: 92  Temp: 98.1 F (36.7 C)  Height: 5' (1.524 m)  Weight: 91 lb (41.3 kg)  SpO2: 94%  TempSrc: Temporal  BMI (Calculated): 17.77     Physical Exam GEN: No acute distress CV: Regular rate and rhythm no murmurs LUNGS: Clear to auscultation bilaterally normal respiratory effort SKIN JOINTS: Warm and dry no rash    Data Reviewed: Imaging: CT chest 01/16/15 - volume loss in the right lower lobe suggest affronted atelectasis, pleural fluid collection versus pleural fluid in the right hemothorax.  Chest x-ray 03/03/22 - new right lower lobe infiltrate. Chest x-ray 08/22/2023-chronic right hilar opacity, small right effusion Chest x-ray 09/13/2023-new right lower lobe  airspace disease CTA 02/05/2024-consolidation in the right lower lobe CT chest 03/03/2024-small loculated right effusion, rounded atelectasis in the right lower lobe CT chest 07/27/2024-occlusive secretions in the right mainstem bronchus.  Small stable right pleural effusion.  Rounded atelectasis in the right lower lobe. I have reviewed images personally.  PFTs:  Labs: Sputum culture 03/11/22 - Pseudomonas, streptococcus agalactiae  Assessment & Plan Recurrent aspiration pneumonia Recurrent aspiration pneumonia due to secretions in the right main bronchus. Recent CT scan showed secretions but no  consolidation. Developed into pneumonia, treated with cefpodoxime  and azithromycin . Aspiration likely exacerbated by episodes of distress and crying, leading to increased coughing and secretions. - Send prescription for cefpodoxime  and azithromycin  to keep on file for future use. - Continue using airway clearance vest and suctioning after vest use. - Utilize cough assist device regularly. - Monitor for signs of pneumonia and initiate antibiotics as needed. - Due for PEG tube replacement in November 2025  Antibiotic-associated diarrhea Diarrhea associated with antibiotic use, specifically azithromycin  and previously with Augmentin . Managed with probiotics and Imodium , which have been effective in controlling symptoms. - Continue probiotics and Imodium  as needed during antibiotic courses.  Depression Depression management complicated by medication error, where oxybutynin  was dispensed instead of Lexapro , leading to a lapse in antidepressant therapy for three weeks. This resulted in increased distress and crying, potentially contributing to aspiration events. Medication administration has been adjusted to ensure proper dispensing and administration via feeding tube. - Ensure Lexapro  is dispensed in tablet form and crushed for administration via feeding tube.  Plan/Recommendations: Continue percussion vest, CoughAssist Antibiotics as needed for aspiration pneumonia  Lonna Coder MD [facility] 08/03/2024, 2:18 PM  CC: Domenica Harlene LABOR, MD

## 2024-08-20 NOTE — Progress Notes (Unsigned)
 Subjective:    Patient ID: [name], male    DOB: 02-Mar-1986, 38 y.o.   MRN: [id number]  No chief complaint on file.   HPI Discussed the use of AI scribe software for clinical note transcription with the patient, who gave verbal consent to proceed.  History of Present Illness [name] is a 38 year old male who presents for follow-up regarding respiratory and catheter management.  He has been experiencing coughing and chest pain, which led to a CT scan of his chest and abdomen. The scan showed aspiration in his upper airways. Initially, antibiotics were not prescribed, but he later developed a fever, prompting the use of antibiotics. He has a history of aspiration pneumonia and is currently using a nebulizer with albuterol  to manage respiratory symptoms.  He is currently on Zegerid  and has ongoing issues with esophageal dysphagia.  He has been experiencing issues with his catheter supplies, specifically with the adhesive properties of the Freedom catheters and barrier wipes, which have been discontinued. He is exploring alternatives such as Network engineer and Bard catheters. His caregiver reports that the last two shipments of Freedom catheters have not been effective due to poor adhesion, leading to increased use of Depends due to accidents. Additionally, the barrier wipes previously used have been discontinued, and the new brand is less effective.  He has a history of increased neurological tone. He is up as tolerated into his chair.    Past Medical History:  Diagnosis Date   Cerebral palsy (HCC)    Dehydration 11/22/2013   Depression with anxiety 08/01/2010   Qualifier: Diagnosis of  By: Nelson-Smith CMA (AAMA), Dottie     Dyslipidemia 08/19/2017   Esophagitis 2011   Gastrostomy in place [facility]) 08/31/2013   GERD (gastroesophageal reflux disease)    Hyperlipidemia, mild 08/25/2015   Hyperthyroidism    Incontinence of feces    Loss of weight 08/28/2014    Medicare annual wellness visit, subsequent 08/25/2015   Mildly underweight adult 03/16/2017   Palpitations    PALSY, INFANTILE CEREBRAL, 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   Qualifier: Diagnosis of  By: Antonio ROSALEA Rockers  Working with PMR Dr Arlana and tolerating Botox  injections in hips, shoulders, etc with good results   Skin lesion of right ear 03/16/2017   Thyroid  disease 08/01/2010   Qualifier: Diagnosis of  By: Marcelo CMA (AAMA), Dottie      Past Surgical History:  Procedure Laterality Date   baclofen  trial     baslofen pump implant     ears tubes     EYE SURGERY     FLEXIBLE SIGMOIDOSCOPY N/A 09/07/2014   Procedure: FLEXIBLE SIGMOIDOSCOPY;  Surgeon: Gordy CHRISTELLA Starch, MD;  Location: [facility] ENDOSCOPY;  Service: Endoscopy;  Laterality: N/A;   g-tube insert  August 2006   hamstring released     to treat contractures.    HIP SURGERY     x2 , side    IR CM INJ ANY COLONIC TUBE W/FLUORO  05/28/2017   IR CM INJ ANY COLONIC TUBE W/FLUORO  07/07/2019   IR CM INJ ANY COLONIC TUBE W/FLUORO  09/18/2019   IR CM INJ ANY COLONIC TUBE W/FLUORO  05/21/2023   IR GASTR TUBE CONVERT GASTR-JEJ PER W/FL MOD SED  03/14/2019   IR GENERIC HISTORICAL  07/01/2016   IR GASTR TUBE CONVERT GASTR-JEJ PER W/FL MOD SED 07/01/2016 Marcey Moan, MD [facility]   IR GENERIC HISTORICAL  07/08/2016   IR PATIENT EVAL TECH 0-60 MINS 07/08/2016 Marcey  Luverne, MD [facility]   IR GENERIC HISTORICAL  07/14/2016   IR GJ TUBE CHANGE 07/14/2016 Norleen Roulette, MD [facility]   IR GENERIC HISTORICAL  07/21/2016   IR PATIENT EVAL TECH 0-60 MINS [facility]   IR GENERIC HISTORICAL  08/31/2016   IR REPLC DUODEN/JEJUNO TUBE PERCUT W/FLUORO 08/31/2016 Ozell Specking, MD [facility]   IR GENERIC HISTORICAL  09/03/2016   IR GJ TUBE CHANGE 09/03/2016 Norleen Roulette, MD [facility]   IR GENERIC HISTORICAL  09/10/2016   IR GASTR TUBE CONVERT GASTR-JEJ PER W/FL MOD SED 09/10/2016 [facility]   IR GENERIC HISTORICAL  09/16/2016   IR PATIENT EVAL TECH  0-60 MINS [facility]   IR GENERIC HISTORICAL  09/29/2016   IR GJ TUBE CHANGE 09/29/2016 Toribio Faes, MD [facility]   IR GJ TUBE CHANGE  02/05/2017   IR GJ TUBE CHANGE  05/21/2017   IR GJ TUBE CHANGE  08/25/2017   IR GJ TUBE CHANGE  01/18/2018   IR GJ TUBE CHANGE  02/04/2018   IR GJ TUBE CHANGE  04/21/2018   IR GJ TUBE CHANGE  08/18/2018   IR GJ TUBE CHANGE  09/12/2019   IR GJ TUBE CHANGE  01/03/2020   IR GJ TUBE CHANGE  05/13/2020   IR GJ TUBE CHANGE  09/11/2020   IR GJ TUBE CHANGE  02/27/2021   IR GJ TUBE CHANGE  08/01/2021   IR GJ TUBE CHANGE  12/31/2021   IR GJ TUBE CHANGE  05/11/2022   IR GJ TUBE CHANGE  07/01/2022   IR GJ TUBE CHANGE  01/28/2023   IR GJ TUBE CHANGE  03/03/2024   IR MECH REMOV OBSTRUC MAT ANY COLON TUBE W/FLUORO  09/02/2018   IR REPLC GASTRO/COLONIC TUBE PERCUT W/FLUORO  10/17/2018   IR REPLC GASTRO/COLONIC TUBE PERCUT W/FLUORO  04/02/2023   IR REPLC GASTRO/COLONIC TUBE PERCUT W/FLUORO  11/18/2023   PEG PLACEMENT  10/21/2011   Procedure: PERCUTANEOUS ENDOSCOPIC GASTROSTOMY (PEG) REPLACEMENT;  Surgeon: Princella CHRISTELLA Nida, MD;  Location: [facility];  Service: Endoscopy;  Laterality: N/A;   PEG PLACEMENT N/A 06/13/2013   Procedure: PERCUTANEOUS ENDOSCOPIC GASTROSTOMY (PEG) REPLACEMENT;  Surgeon: Princella CHRISTELLA Nida, MD;  Location: [facility];  Service: Endoscopy;  Laterality: N/A;   SPINAL FUSION     spinal fusion to correct 70 degree kyphosis  11-2010   spinal fusioncorrect 106 degree kyphosis     SPINE SURGERY  ,11/20/2010, 2011   for correction of severe contracturing spinal kyphosis.    TONSILLECTOMY      Family History  Problem Relation Age of Onset   Asthma Mother    Hyperlipidemia Mother    COPD Mother    Other Mother        bronchial stasis/ABPA   Cancer Maternal Grandmother 40       breast   Hyperlipidemia Maternal Grandmother    Hypertension Maternal Grandmother    Cancer Maternal Grandfather        prostate   Heart disease Paternal Grandfather        CHF    Osteoporosis Paternal Grandmother    Arthritis Paternal Grandmother        rheumatoid    Social History   Socioeconomic History   Marital status: Single    Spouse name: Not on file   Number of children: 0   Years of education: Not on file   Highest education level: GED or equivalent  Occupational History   Occupation: disbaled  Tobacco Use  Smoking status: Never   Smokeless tobacco: Never  Vaping Use   Vaping status: Never Used  Substance and Sexual Activity   Alcohol use: No   Drug use: No   Sexual activity: Never  Other Topics Concern   Not on file  Social History Narrative   Not on file   Social Drivers of Health   Financial Resource Strain: Low Risk  (01/20/2024)   Overall Financial Resource Strain (CARDIA)    Difficulty of Paying Living Expenses: Not very hard  Food Insecurity: No Food Insecurity (03/07/2024)   Hunger Vital Sign    Worried About Running Out of Food in the Last Year: Never true    Ran Out of Food in the Last Year: Never true  Transportation Needs: No Transportation Needs (03/07/2024)   PRAPARE - Administrator, Civil Service (Medical): No    Lack of Transportation (Non-Medical): No  Physical Activity: Inactive (01/20/2024)   Exercise Vital Sign    Days of Exercise per Week: 0 days    Minutes of Exercise per Session: 0 min  Stress: Stress Concern Present (01/20/2024)   [employer] of Occupational Health - Occupational Stress Questionnaire    Feeling of Stress : To some extent  Social Connections: Moderately Integrated (01/20/2024)   Social Connection and Isolation Panel    Frequency of Communication with Friends and Family: Three times a week    Frequency of Social Gatherings with Friends and Family: Patient declined    Attends Religious Services: More than 4 times per year    Active Member of [employer] or Organizations: Yes    Attends Engineer, structural: More than 4 times per year    Marital Status: Never married  Intimate  Partner Violence: Patient Unable To Answer (03/07/2024)   Humiliation, Afraid, Rape, and Kick questionnaire    Fear of Current or Ex-Partner: Patient unable to answer    Emotionally Abused: Patient unable to answer    Physically Abused: Patient unable to answer    Sexually Abused: Patient unable to answer    Outpatient Medications Prior to Visit  Medication Sig Dispense Refill   albuterol  (PROVENTIL ) (2.5 MG/3ML) 0.083% nebulizer solution Take 3 mLs (2.5 mg total) by nebulization every 4 (four) hours as needed for wheezing or shortness of breath. 150 mL 1   amoxicillin -clavulanate (AUGMENTIN ) 875-125 MG tablet Take 1 tablet by mouth 2 (two) times daily. (Patient not taking: Reported on 08/03/2024) 14 tablet 0   azithromycin  (ZITHROMAX ) 250 MG tablet Take 500 mg on day 1 and then 250 mg/day for the next 4 days 6 tablet 0   cefpodoxime  (VANTIN ) 200 MG tablet Take 1 tablet (200 mg total) by mouth 2 (two) times daily. 14 tablet 0   clotrimazole -betamethasone  (LOTRISONE ) cream Apply 1 Application topically 2 (two) times daily as needed. (Patient not taking: Reported on 08/03/2024) 45 g 1   dantrolene  (DANTRIUM ) 100 MG capsule Take 1 capsule (100 mg total) by mouth 3 (three) times daily. 90 capsule 7   DEPAKOTE  SPRINKLES 125 MG capsule Take 250-375 mg by mouth See admin instructions. Take 375mg  (3 capsules) by mouth at 0900, 250mg  (2 capsules) at 2030, and 250mg  (2 capsules) at 2130. G tube     Dextromethorphan -guaiFENesin  5-100 MG/5ML LIQD Give 20 mLs by tube every 6 (six) hours as needed (Cough).     diazepam  (VALIUM ) 5 MG tablet Place 5 mg into feeding tube daily as needed for anxiety.     doxepin  (SINEQUAN ) 25 MG  capsule Place 25 mg into feeding tube at bedtime.     escitalopram  (LEXAPRO ) 5 MG/5ML solution Place 20 mLs into feeding tube daily.     HYDROcodone  bit-homatropine (HYDROMET) 5-1.5 MG/5ML syrup Take 5 mLs by mouth every 6 (six) hours as needed for cough. (Patient not taking: Reported on  08/03/2024) 120 mL 0   lamoTRIgine  (LAMICTAL ) 25 MG tablet TAKE 2 TABLETS BY MOUTH 2 TIMES DAILY. 120 tablet 6   loperamide  HCl (IMODIUM  A-D) 1 MG/7.5ML solution Place 15 mLs (2 mg total) into feeding tube as needed for diarrhea or loose stools.     LORazepam  (ATIVAN ) 1 MG tablet Place 1 mg into feeding tube at bedtime.     Nutritional Supplements (FEEDING SUPPLEMENT, KATE FARMS STANDARD 1.4,) LIQD liquid Take 325 mLs by mouth as directed. 3 carton over 16 hrs     OLANZapine  (ZYPREXA ) 10 MG tablet Place 10 mg into feeding tube at bedtime.     OLANZapine  (ZYPREXA ) 5 MG tablet Place 5 mg into feeding tube daily. @1700      omeprazole  (PRILOSEC) 20 MG capsule Take 1 capsule (20 mg total) by mouth daily. 30 capsule 3   OVER THE COUNTER MEDICATION Give 1 capsule by tube 2 (two) times daily. Muscle Calm Formula     OVER THE COUNTER MEDICATION Give 1 capsule by tube at bedtime. CBD/Melaonin     oxyBUTYnin  (DITROPAN ) 5 MG/5ML solution Place 7.5 mg into feeding tube at bedtime.     Saccharomyces boulardii (FLORASTOR PO) Give 1 capsule by tube every evening.     sucralfate  (CARAFATE ) 1 g tablet Place 1 tablet (1 g total) into feeding tube daily. 30 tablet 3   traZODone  (DESYREL ) 150 MG tablet Place 150 mg into feeding tube at bedtime. (Patient taking differently: Place 150 mg into feeding tube at bedtime. Taking 225)     No facility-administered medications prior to visit.    Allergies  Allergen Reactions   Ambien  [Zolpidem  Tartrate] Nausea Only   Antihistamines, Chlorpheniramine-Type     Other reaction(s): Other (See Comments) Other Reaction: agitation   Augmentin  [Amoxicillin -Pot Clavulanate] Diarrhea   Cefdinir  Diarrhea    Not tolerate well and cause diarrhea   Codeine  Other (See Comments)    Makes patient too active after a few days.   Zolpidem      Other Reaction(s): GI Intolerance   Baclofen  Anxiety and Swelling    anxiety   Metoclopramide  Other (See Comments) and Swelling    Delusion,  emotionality  Other Reaction(s): Mental Status Changes   Pheniramine Rash    Other reaction(s): Other (See Comments)  Other Reaction: agitation  Other Reaction(s): Other (See Comments)  Other reaction(s): Other (See Comments), Other Reaction: agitation   Sulfa Antibiotics Rash   Sulfonamide Derivatives Rash    Review of Systems  Constitutional:  Negative for fever and malaise/fatigue.  HENT:  Positive for congestion.   Eyes:  Negative for blurred vision.  Respiratory:  Positive for cough. Negative for shortness of breath.   Cardiovascular:  Positive for chest pain. Negative for palpitations and leg swelling.  Gastrointestinal:  Negative for abdominal pain, blood in stool and nausea.  Genitourinary:  Negative for dysuria and frequency.  Musculoskeletal:  Negative for falls.  Skin:  Negative for rash.  Neurological:  Negative for dizziness, loss of consciousness and headaches.  Endo/Heme/Allergies:  Negative for environmental allergies.  Psychiatric/Behavioral:  Negative for depression. The patient is not nervous/anxious.        Objective:    Physical Exam  Constitutional:      General: He is not in acute distress.    Appearance: Normal appearance. He is not ill-appearing or toxic-appearing.  HENT:     Head: Normocephalic and atraumatic.     Right Ear: External ear normal.     Left Ear: External ear normal.     Nose: Nose normal.  Eyes:     General:        Right eye: No discharge.        Left eye: No discharge.  Pulmonary:     Effort: Pulmonary effort is normal.  Skin:    Findings: No rash.  Neurological:     Mental Status: He is alert and oriented to person, place, and time.     Comments: In wheelchair, spastic quadriplegia.   Psychiatric:        Behavior: Behavior normal.     There were no vitals taken for this visit. Wt Readings from Last 3 Encounters:  08/03/24 91 lb (41.3 kg)  07/27/24 91 lb 14.9 oz (41.7 kg)  07/20/24 92 lb (41.7 kg)    Diabetic  Foot Exam - Simple   No data filed    Lab Results  Component Value Date   WBC 8.1 07/27/2024   HGB 14.7 07/27/2024   HCT 43.3 07/27/2024   PLT 216 07/27/2024   GLUCOSE 84 07/27/2024   CHOL 140 04/26/2023   TRIG 61.0 04/26/2023   HDL 43.50 04/26/2023   LDLCALC 84 04/26/2023   ALT 13 07/27/2024   AST 22 07/27/2024   NA 136 07/27/2024   K 3.7 07/27/2024   CL 97 (L) 07/27/2024   CREATININE <0.30 (L) 07/27/2024   BUN 8 07/27/2024   CO2 27 07/27/2024   TSH 1.395 02/06/2024   INR 1.0 03/01/2024   HGBA1C 5.0 04/29/2007    Lab Results  Component Value Date   TSH 1.395 02/06/2024   Lab Results  Component Value Date   WBC 8.1 07/27/2024   HGB 14.7 07/27/2024   HCT 43.3 07/27/2024   MCV 91.2 07/27/2024   PLT 216 07/27/2024   Lab Results  Component Value Date   NA 136 07/27/2024   K 3.7 07/27/2024   CO2 27 07/27/2024   GLUCOSE 84 07/27/2024   BUN 8 07/27/2024   CREATININE <0.30 (L) 07/27/2024   BILITOT 0.3 07/27/2024   ALKPHOS 80 07/27/2024   AST 22 07/27/2024   ALT 13 07/27/2024   PROT 7.3 07/27/2024   ALBUMIN 4.9 07/27/2024   CALCIUM  9.7 07/27/2024   ANIONGAP 13 07/27/2024   GFR 152.62 02/16/2024   Lab Results  Component Value Date   CHOL 140 04/26/2023   Lab Results  Component Value Date   HDL 43.50 04/26/2023   Lab Results  Component Value Date   LDLCALC 84 04/26/2023   Lab Results  Component Value Date   TRIG 61.0 04/26/2023   Lab Results  Component Value Date   CHOLHDL 3 04/26/2023   Lab Results  Component Value Date   HGBA1C 5.0 04/29/2007       Assessment & Plan:  Recurrent aspiration pneumonia (HCC) Assessment & Plan: Follows with pulmonary and is NPO but still struggles     Assessment and Plan Assessment & Plan Aspiration pneumonitis Recent CT scan showed aspiration in upper airways without pneumonia. Respiratory status is well-managed with no wheezing or crackling on auscultation. - Administer albuterol  nebulizer treatments  today and tomorrow to alleviate tightness. - Use antibiotics if symptoms worsen. They also note  he was exposed to some toxic fumes at his home today and notes some mild chest discomfort as a result they are encouraged to use albuterol  bid for next 2 days  Gastroesophageal reflux disease and esophageal dysphagia Current management includes Zegerid . Plan to switch to omeprazole  40 mg due to cost concerns with over-the-counter Zegerid . - Prescribe omeprazole  40 mg to be filled at [facility].  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 Freedom catheters are not adhering well and are being discontinued. Coloplast Optima samples are not satisfactory. Bard samples are being considered. Barrier wipes are less sticky than previous ones. - Order Bard catheter samples from [facility]. - Evaluate Bard catheter samples for suitability.  Urinary incontinence with external catheter use Current external catheter supplies are inadequate due to poor adhesion and discontinuation of products. New samples are being evaluated for better fit and adhesion. - Evaluate Bard catheter samples for suitability. Paperwork filled out for him to get his annual supplies   Recording duration: 29 minutes     Harlene Horton, MD

## 2024-08-20 NOTE — Assessment & Plan Note (Signed)
 Continues to be NPO and use his gastrostomy for feeds

## 2024-08-20 NOTE — Assessment & Plan Note (Signed)
 Follows with pulmonary and is NPO but still struggles

## 2024-08-21 ENCOUNTER — Encounter: Payer: Self-pay | Admitting: Family Medicine

## 2024-08-21 ENCOUNTER — Other Ambulatory Visit: Payer: Self-pay | Admitting: Internal Medicine

## 2024-08-21 ENCOUNTER — Ambulatory Visit (INDEPENDENT_AMBULATORY_CARE_PROVIDER_SITE_OTHER): Admitting: Family Medicine

## 2024-08-21 ENCOUNTER — Other Ambulatory Visit: Payer: Self-pay | Admitting: Physical Medicine & Rehabilitation

## 2024-08-21 VITALS — BP 90/62 | HR 81 | Temp 98.0°F | Resp 18 | Ht 60.0 in | Wt 91.0 lb

## 2024-08-21 DIAGNOSIS — Z931 Gastrostomy status: Secondary | ICD-10-CM

## 2024-08-21 DIAGNOSIS — G809 Cerebral palsy, unspecified: Secondary | ICD-10-CM

## 2024-08-21 DIAGNOSIS — J690 Pneumonitis due to inhalation of food and vomit: Secondary | ICD-10-CM | POA: Diagnosis not present

## 2024-08-21 DIAGNOSIS — G8250 Quadriplegia, unspecified: Secondary | ICD-10-CM

## 2024-08-21 DIAGNOSIS — Z23 Encounter for immunization: Secondary | ICD-10-CM | POA: Diagnosis not present

## 2024-08-21 MED ORDER — OMEPRAZOLE 40 MG PO CPDR: 40.0000 mg | DELAYED_RELEASE_CAPSULE | Freq: Every day | ORAL | 5 refills | Status: DC

## 2024-08-21 NOTE — Patient Instructions (Signed)
 Annual covid shot  RSV, REspiratory Syncitial Virus vaccine, Arexvy at pharmacy if he can have at his age

## 2024-08-30 ENCOUNTER — Other Ambulatory Visit: Payer: Self-pay | Admitting: Physical Medicine & Rehabilitation

## 2024-08-30 DIAGNOSIS — G809 Cerebral palsy, unspecified: Secondary | ICD-10-CM

## 2024-08-30 DIAGNOSIS — G8250 Quadriplegia, unspecified: Secondary | ICD-10-CM

## 2024-09-04 ENCOUNTER — Encounter: Payer: Self-pay | Admitting: Radiology

## 2024-09-04 ENCOUNTER — Other Ambulatory Visit (HOSPITAL_COMMUNITY): Payer: Self-pay | Admitting: Radiology

## 2024-09-04 ENCOUNTER — Ambulatory Visit (HOSPITAL_COMMUNITY)
Admission: RE | Admit: 2024-09-04 | Discharge: 2024-09-04 | Disposition: A | Discharge status: Home / Self Care | Source: Ambulatory Visit | Attending: Radiology | Admitting: Radiology

## 2024-09-04 DIAGNOSIS — Z434 Encounter for attention to other artificial openings of digestive tract: Secondary | ICD-10-CM | POA: Diagnosis not present

## 2024-09-04 DIAGNOSIS — R1319 Other dysphagia: Secondary | ICD-10-CM

## 2024-09-04 DIAGNOSIS — R1310 Dysphagia, unspecified: Secondary | ICD-10-CM

## 2024-09-04 MED ORDER — LIDOCAINE VISCOUS HCL 2 % MT SOLN
OROMUCOSAL | Status: AC
  Filled 2024-09-04: qty 15

## 2024-09-04 MED ORDER — IOHEXOL 300 MG/ML  SOLN
50.0000 mL | Freq: Once | INTRAMUSCULAR | Status: AC | PRN
  Administered 2024-09-04: 20 mL

## 2024-09-04 NOTE — Procedures (Signed)
 Interventional Radiology Procedure Note  Procedure: GJ tube exchange routine  Complications: None  Estimated Blood Loss: < 10 mL  Findings: GJ tube exchange.   Cordella DELENA Banner, MD

## 2024-09-09 ENCOUNTER — Encounter: Payer: Self-pay | Admitting: Pulmonary Disease

## 2024-09-11 NOTE — Telephone Encounter (Signed)
 Should we p refill these abx for them to have on hand

## 2024-09-15 MED ORDER — CEFPODOXIME PROXETIL 200 MG PO TABS: 200.0000 mg | ORAL_TABLET | Freq: Two times a day (BID) | ORAL | 0 refills | Status: DC

## 2024-09-15 MED ORDER — AZITHROMYCIN 250 MG PO TABS: ORAL_TABLET | ORAL | 0 refills | Status: DC

## 2024-09-25 ENCOUNTER — Ambulatory Visit (INDEPENDENT_AMBULATORY_CARE_PROVIDER_SITE_OTHER): Admitting: Physician Assistant

## 2024-09-25 ENCOUNTER — Ambulatory Visit: Admitting: Physician Assistant

## 2024-09-25 DIAGNOSIS — M25562 Pain in left knee: Secondary | ICD-10-CM

## 2024-09-25 DIAGNOSIS — M25561 Pain in right knee: Secondary | ICD-10-CM | POA: Diagnosis not present

## 2024-09-25 DIAGNOSIS — G8929 Other chronic pain: Secondary | ICD-10-CM

## 2024-09-25 MED ORDER — LIDOCAINE HCL 1 % IJ SOLN
3.0000 mL | INTRAMUSCULAR | Status: AC | PRN
  Administered 2024-09-25: 3 mL

## 2024-09-25 MED ORDER — METHYLPREDNISOLONE ACETATE 40 MG/ML IJ SUSP
40.0000 mg | INTRAMUSCULAR | Status: AC | PRN
  Administered 2024-09-25: 40 mg via INTRA_ARTICULAR

## 2024-09-25 NOTE — Progress Notes (Signed)
   Procedure Note  Patient: [name]             Date of Birth: Jan 09, 1986           MRN: [id number]             Visit Date: 09/25/2024 [name] comes in today with his mother and father.  They are requesting cortisone injections both knees.  Last injections were 04/04/2024 of both knees and states that he began complaining about knee pain in the last month or so.  No new injuries.  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  No abnormal warmth erythema or effusion of either knee.  No wound breakdown on either knee.  Procedures: Visit Diagnoses:  1. Chronic pain of right knee   2. Chronic pain of left knee     Large Joint Inj: bilateral knee on 09/25/2024 4:30 PM Indications: pain Details: 22 G 1.5 in needle, anterolateral approach  Arthrogram: No  Medications (Right): 3 mL lidocaine  1 %; 40 mg methylPREDNISolone  acetate 40 MG/ML Medications (Left): 3 mL lidocaine  1 %; 40 mg methylPREDNISolone  acetate 40 MG/ML Outcome: tolerated well, no immediate complications Procedure, treatment alternatives, risks and benefits explained, specific risks discussed. Consent was given by the patient. Immediately prior to procedure a time out was called to verify the correct patient, procedure, equipment, support staff and site/side marked as required. Patient was prepped and draped in the usual sterile fashion.     Plan: Follow-up as needed.  Patient tolerated injections well.

## 2024-10-10 ENCOUNTER — Ambulatory Visit: Payer: Medicare Other

## 2024-10-10 VITALS — Ht 60.0 in | Wt 91.0 lb

## 2024-10-10 DIAGNOSIS — Z0000 Encounter for general adult medical examination without abnormal findings: Secondary | ICD-10-CM

## 2024-10-10 NOTE — Patient Instructions (Addendum)
 [name],  Thank you for taking the time for your Medicare Wellness Visit. I appreciate your continued commitment to your health goals. Please review the care plan we discussed, and feel free to reach out if I can assist you further.  Please note that Annual Wellness Visits do not include a physical exam. Some assessments may be limited, especially if the visit was conducted virtually. If needed, we may recommend an in-person follow-up with your provider.  Ongoing Care Seeing your primary care provider every 3 to 6 months helps us  monitor your health and provide consistent, personalized care.   Referrals If a referral was made during today's visit and you haven't received any updates within two weeks, please contact the referred provider directly to check on the status.  Recommended Screenings:  Health Maintenance  Topic Date Due   Hepatitis C Screening  Never done   HPV Vaccine (1 - 3-dose SCDM series) Never done   Hepatitis B Vaccine (3 of 3 - 19+ 3-dose series) 10/07/2011   DTaP/Tdap/Td vaccine (3 - Tdap) 06/20/2019   COVID-19 Vaccine (5 - 2025-26 season) 07/03/2024   Medicare Annual Wellness Visit  10/10/2025   Flu Shot  Completed   HIV Screening  Completed   Pneumococcal Vaccine  Aged Out   Meningitis B Vaccine  Aged Out       10/10/2024    1:10 PM  Advanced Directives  Does Patient Have a Medical Advance Directive? Yes  Type of Estate Agent of [name];Living will  Does patient want to make changes to medical advance directive? No - Patient declined  Copy of Healthcare Power of Attorney in Chart? Yes - validated most recent copy scanned in chart (See row information)    Vision: Annual vision screenings are recommended for early detection of glaucoma, cataracts, and diabetic retinopathy. These exams can also reveal signs of chronic conditions such as diabetes and high blood pressure.  Dental: Annual dental screenings help detect early signs of oral  cancer, gum disease, and other conditions linked to overall health, including heart disease and diabetes.  Please see the attached documents for additional preventive care recommendations.

## 2024-10-10 NOTE — Progress Notes (Signed)
 Chief Complaint  Patient presents with   Medicare Wellness     Subjective:   [name] is a 38 y.o. male who presents for a Medicare Annual Wellness Visit.  Visit info / Clinical Intake: Medicare Wellness Visit Type:: Subsequent Annual Wellness Visit Persons participating in visit and providing information:: caregiver (with patient present) Medicare Wellness Visit Mode:: Video Since this visit was completed virtually, some vitals may be partially provided or unavailable. Missing vitals are due to the limitations of the virtual format.: Documented vitals are patient reported If Telephone or Video please confirm:: I connected with patient using audio/video enable telemedicine. I verified patient identity with two identifiers, discussed telehealth limitations, and patient agreed to proceed. Patient Location:: Home Provider Location:: Office Interpreter Needed?: No Pre-visit prep was completed: yes AWV questionnaire completed by patient prior to visit?: no Living arrangements:: other (Supported living  Home) Patient's Overall Health Status Rating: good Typical amount of pain: none Does pain affect daily life?: no Are you currently prescribed opioids?: no  Dietary Habits and Nutritional Risks How many meals a day?: other: (Feeding Pump) Eats fruit and vegetables daily?: (!) no (Feeding Pump) Most meals are obtained by: -- (Uses Feeding Pump) In the last 2 weeks, have you had any of the following?: none Diabetic:: no  Functional Status Activities of Daily Living (to include ambulation/medication): (!) Dependent (Total Care Aide assist) Feeding: Dependent (Feeding Pump) Dressing/Grooming: Dependent (Total Care Aids assist) Toileting: Dependent (Total assist Aides assist) Transfer: Dependent (Total assist Aides assist) Ambulation: Dependent Home Assistive Devices/Equipment: Feeding utensils; Cough assistive device (Peds); Hoyer Lift; [employer]; Communication  device (specify type); Nebulizers; Wheelchair; Shower/tub chair Medication Administration: Dependent (Aide assist) Is this a change from baseline?: Pre-admission baseline Home Management (perform basic housework or laundry): Dependent Manage your own finances?: (!) no (Mother assist) Primary transportation is: family / friends Concerns about vision?: no *vision screening is required for WT[facility]* Concerns about hearing?: no  Fall Screening Falls in the past year?: 0 Number of falls in past year: 0 Was there an injury with Fall?: 0 Fall Risk Category Calculator: 0 Patient Fall Risk Level: Low Fall Risk  Fall Risk Patient at Risk for Falls Due to: No Fall Risks Fall risk Follow up: Falls evaluation completed  Home and Transportation Safety: All rugs have non-skid backing?: N/A, no rugs All stairs or steps have railings?: N/A, no stairs (Wheelchair Ramp) Grab bars in the bathtub or shower?: (!) no It Consultant) Have non-skid surface in bathtub or shower?: (!) no It Consultant) Good home lighting?: yes Regular seat belt use?: yes Hospital stays in the last year:: (!) yes How many hospital stays:: 2 Reason: Pneumonia  Cognitive Assessment Difficulty concentrating, remembering, or making decisions? : yes (Mother assist) Will 6CIT or Mini Cog be Completed: no 6CIT or Mini Cog Declined: patient has a diagnosis of dementia or cognitive impairment  Advance Directives (For Healthcare) Does Patient Have a Medical Advance Directive?: Yes Does patient want to make changes to medical advance directive?: No - Patient declined Type of Advance Directive: Healthcare Power of [place]; Living will Copy of Healthcare Power of Attorney in Chart?: Yes - validated most recent copy scanned in chart (See row information) Copy of Living Will in Chart?: Yes - validated most recent copy scanned in chart (See row information)  Reviewed/Updated  Reviewed/Updated: Reviewed All (Medical, Surgical,  Family, Medications, Allergies, Care Teams, Patient Goals)    Allergies (verified) Ambien  [zolpidem  tartrate]; Antihistamines, chlorpheniramine-type; Augmentin  [amoxicillin -pot  clavulanate]; Cefdinir ; Codeine ; Zolpidem ; Baclofen ; Metoclopramide ; Pheniramine; Sulfa antibiotics; and Sulfonamide derivatives   Current Medications (verified) Outpatient Encounter Medications as of 10/10/2024  Medication Sig   albuterol  (PROVENTIL ) (2.5 MG/3ML) 0.083% nebulizer solution Take 3 mLs (2.5 mg total) by nebulization every 4 (four) hours as needed for wheezing or shortness of breath.   azithromycin  (ZITHROMAX ) 250 MG tablet Take 500 mg on day 1 and then 250 mg/day for the next 4 days   cefpodoxime  (VANTIN ) 200 MG tablet Take 1 tablet (200 mg total) by mouth 2 (two) times daily.   clotrimazole -betamethasone  (LOTRISONE ) cream Apply 1 Application topically 2 (two) times daily as needed.   dantrolene  (DANTRIUM ) 100 MG capsule TAKE 1 CAPSULE BY MOUTH 3 TIMES DAILY.   DEPAKOTE  SPRINKLES 125 MG capsule Take 250-375 mg by mouth See admin instructions. Take 375mg  (3 capsules) by mouth at 0900, 250mg  (2 capsules) at 2030, and 250mg  (2 capsules) at 2130. G tube   Dextromethorphan -guaiFENesin  5-100 MG/5ML LIQD Give 20 mLs by tube every 6 (six) hours as needed (Cough).   diazepam  (VALIUM ) 5 MG tablet Place 5 mg into feeding tube daily as needed for anxiety.   doxepin  (SINEQUAN ) 25 MG capsule Place 25 mg into feeding tube at bedtime.   escitalopram  (LEXAPRO ) 5 MG/5ML solution Place 20 mLs into feeding tube daily.   lamoTRIgine  (LAMICTAL ) 25 MG tablet TAKE 2 TABLETS BY MOUTH 2 TIMES DAILY.   loperamide  HCl (IMODIUM  A-D) 1 MG/7.5ML solution Place 15 mLs (2 mg total) into feeding tube as needed for diarrhea or loose stools.   LORazepam  (ATIVAN ) 1 MG tablet Place 1 mg into feeding tube at bedtime.   Nutritional Supplements (FEEDING SUPPLEMENT, KATE FARMS STANDARD 1.4,) LIQD liquid Take 325 mLs by mouth as directed. 3 carton  over 16 hrs   OLANZapine  (ZYPREXA ) 10 MG tablet Place 10 mg into feeding tube at bedtime.   OLANZapine  (ZYPREXA ) 5 MG tablet Place 5 mg into feeding tube daily. @1700    omeprazole  (PRILOSEC) 40 MG capsule Take 1 capsule (40 mg total) by mouth daily.   OVER THE COUNTER MEDICATION Give 1 capsule by tube 2 (two) times daily. Muscle Calm Formula   OVER THE COUNTER MEDICATION Give 1 capsule by tube at bedtime. CBD/Melaonin   oxyBUTYnin  (DITROPAN ) 5 MG/5ML solution Place 7.5 mg into feeding tube at bedtime.   Saccharomyces boulardii (FLORASTOR PO) Give 1 capsule by tube every evening.   sucralfate  (CARAFATE ) 1 g tablet PLACE 1 TABLET INTO FEEDING TUBE DAILY.   traZODone  (DESYREL ) 150 MG tablet Place 225 mg into feeding tube at bedtime.   No facility-administered encounter medications on file as of 10/10/2024.    History: Past Medical History:  Diagnosis Date   Cerebral palsy (HCC)    Dehydration 11/22/2013   Depression with anxiety 08/01/2010   Qualifier: Diagnosis of  By: Nelson-Smith CMA (AAMA), Dottie     Dyslipidemia 08/19/2017   Esophagitis 2011   Gastrostomy in place [facility]) 08/31/2013   GERD (gastroesophageal reflux disease)    Hyperlipidemia, mild 08/25/2015   Hyperthyroidism    Incontinence of feces    Loss of weight 08/28/2014   Medicare annual wellness visit, subsequent 08/25/2015   Mildly underweight adult 03/16/2017   Palpitations    PALSY, INFANTILE CEREBRAL, 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   Qualifier: Diagnosis of  By: Antonio ROSALEA Rockers  Working with PMR Dr Arlana and tolerating Botox  injections in hips, shoulders, etc with good results   Skin lesion of right ear 03/16/2017   Thyroid  disease 08/01/2010  Qualifier: Diagnosis of  By: Nelson-Smith CMA (AAMA), Dottie     Past Surgical History:  Procedure Laterality Date   baclofen  trial     baslofen pump implant     ears tubes     EYE SURGERY     FLEXIBLE SIGMOIDOSCOPY N/A 09/07/2014   Procedure: FLEXIBLE SIGMOIDOSCOPY;  Surgeon:  Gordy CHRISTELLA Starch, MD;  Location: [facility];  Service: Endoscopy;  Laterality: N/A;   g-tube insert  August 2006   hamstring released     to treat contractures.    HIP SURGERY     x2 , side    IR CM INJ ANY COLONIC TUBE W/FLUORO  05/28/2017   IR CM INJ ANY COLONIC TUBE W/FLUORO  07/07/2019   IR CM INJ ANY COLONIC TUBE W/FLUORO  09/18/2019   IR CM INJ ANY COLONIC TUBE W/FLUORO  05/21/2023   IR GASTR TUBE CONVERT GASTR-JEJ PER W/FL MOD SED  03/14/2019   IR GENERIC HISTORICAL  07/01/2016   IR GASTR TUBE CONVERT GASTR-JEJ PER W/FL MOD SED 07/01/2016 Marcey Moan, MD [facility]   IR GENERIC HISTORICAL  07/08/2016   IR PATIENT EVAL TECH 0-60 MINS 07/08/2016 Marcey Moan, MD [facility]   IR GENERIC HISTORICAL  07/14/2016   IR GJ TUBE CHANGE 07/14/2016 Norleen Roulette, MD [facility]   IR GENERIC HISTORICAL  07/21/2016   IR PATIENT EVAL TECH 0-60 MINS [facility]   IR GENERIC HISTORICAL  08/31/2016   IR REPLC DUODEN/JEJUNO TUBE PERCUT W/FLUORO 08/31/2016 Ozell Specking, MD [facility]   IR GENERIC HISTORICAL  09/03/2016   IR GJ TUBE CHANGE 09/03/2016 Norleen Roulette, MD [facility]   IR GENERIC HISTORICAL  09/10/2016   IR GASTR TUBE CONVERT GASTR-JEJ PER W/FL MOD SED 09/10/2016 [facility]   IR GENERIC HISTORICAL  09/16/2016   IR PATIENT EVAL TECH 0-60 MINS [facility]   IR GENERIC HISTORICAL  09/29/2016   IR GJ TUBE CHANGE 09/29/2016 Toribio Faes, MD [facility]   IR GJ TUBE CHANGE  02/05/2017   IR GJ TUBE CHANGE  05/21/2017   IR GJ TUBE CHANGE  08/25/2017   IR GJ TUBE CHANGE  01/18/2018   IR GJ TUBE CHANGE  02/04/2018   IR GJ TUBE CHANGE  04/21/2018   IR GJ TUBE CHANGE  08/18/2018   IR GJ TUBE CHANGE  09/12/2019   IR GJ TUBE CHANGE  01/03/2020   IR GJ TUBE CHANGE  05/13/2020   IR GJ TUBE CHANGE  09/11/2020   IR GJ TUBE CHANGE  02/27/2021   IR GJ TUBE CHANGE  08/01/2021   IR GJ TUBE CHANGE  12/31/2021   IR GJ TUBE CHANGE  05/11/2022   IR GJ TUBE CHANGE  07/01/2022   IR GJ TUBE CHANGE  01/28/2023    IR GJ TUBE CHANGE  03/03/2024   IR GJ TUBE CHANGE  09/04/2024   IR MECH REMOV OBSTRUC MAT ANY COLON TUBE W/FLUORO  09/02/2018   IR REPLC GASTRO/COLONIC TUBE PERCUT W/FLUORO  10/17/2018   IR REPLC GASTRO/COLONIC TUBE PERCUT W/FLUORO  04/02/2023   IR REPLC GASTRO/COLONIC TUBE PERCUT W/FLUORO  11/18/2023   PEG PLACEMENT  10/21/2011   Procedure: PERCUTANEOUS ENDOSCOPIC GASTROSTOMY (PEG) REPLACEMENT;  Surgeon: Princella CHRISTELLA Nida, MD;  Location: [facility];  Service: Endoscopy;  Laterality: N/A;   PEG PLACEMENT N/A 06/13/2013   Procedure: PERCUTANEOUS ENDOSCOPIC GASTROSTOMY (PEG) REPLACEMENT;  Surgeon: Princella CHRISTELLA Nida, MD;  Location: [facility];  Service: Endoscopy;  Laterality: N/A;   SPINAL FUSION  spinal fusion to correct 70 degree kyphosis  11-2010   spinal fusioncorrect 106 degree kyphosis     SPINE SURGERY  ,11/20/2010, 2011   for correction of severe contracturing spinal kyphosis.    TONSILLECTOMY     Family History  Problem Relation Age of Onset   Asthma Mother    Hyperlipidemia Mother    COPD Mother    Other Mother        bronchial stasis/ABPA   Cancer Maternal Grandmother 41       breast   Hyperlipidemia Maternal Grandmother    Hypertension Maternal Grandmother    Cancer Maternal Grandfather        prostate   Heart disease Paternal Grandfather        CHF   Osteoporosis Paternal Grandmother    Arthritis Paternal Grandmother        rheumatoid   Social History   Occupational History   Occupation: disbaled  Tobacco Use   Smoking status: Never   Smokeless tobacco: Never  Vaping Use   Vaping status: Never Used  Substance and Sexual Activity   Alcohol use: No   Drug use: No   Sexual activity: Never   Tobacco Counseling Counseling given: No  SDOH Screenings   Food Insecurity: No Food Insecurity (10/10/2024)  Housing: Unknown (10/10/2024)  Transportation Needs: No Transportation Needs (10/10/2024)  Utilities: Not At Risk (10/10/2024)  Alcohol Screen: Low Risk  (10/05/2023)   Depression (PHQ2-9): Low Risk  (10/10/2024)  Recent Concern: Depression (PHQ2-9) - Medium Risk (07/20/2024)  Financial Resource Strain: Low Risk  (01/20/2024)  Physical Activity: Insufficiently Active (10/10/2024)  Social Connections: Moderately Integrated (10/10/2024)  Stress: No Stress Concern Present (10/10/2024)  Tobacco Use: Low Risk  (10/10/2024)  Health Literacy: Inadequate Health Literacy (10/10/2024)   See flowsheets for full screening details  Depression Screen Depression Screening Exception Documentation Depression Screening Exception:: Patient refusal  PHQ 2 & 9 Depression Scale- Over the past 2 weeks, how often have you been bothered by any of the following problems? Little interest or pleasure in doing things: 0 Feeling down, depressed, or hopeless (PHQ Adolescent also includes...irritable): 0 PHQ-2 Total Score: 0 Trouble falling or staying asleep, or sleeping too much: 1 Feeling tired or having little energy: 0 Poor appetite or overeating (PHQ Adolescent also includes...weight loss): 2 Feeling bad about yourself - or that you are a failure or have let yourself or your family down: 0 Trouble concentrating on things, such as reading the newspaper or watching television (PHQ Adolescent also includes...like school work): 0 Moving or speaking so slowly that other people could have noticed. Or the opposite - being so fidgety or restless that you have been moving around a lot more than usual: 0 Thoughts that you would be better off dead, or of hurting yourself in some way: 0 PHQ-9 Total Score: 5     Goals Addressed               This Visit's Progress     No current goals (pt-stated)        Avoid hospital stays.             Objective:    Today's Vitals   10/10/24 1309  Weight: 91 lb (41.3 kg)  Height: 5' (1.524 m)   Body mass index is 17.77 kg/m.  Hearing/Vision screen Hearing Screening - Comments:: Denies hearing difficulties   Vision Screening - Comments::  Wears rx glasses - Not up to date with routine eye exams. Deferred  Immunizations and Health Maintenance Health Maintenance  Topic Date Due   Hepatitis C Screening  Never done   HPV VACCINES (1 - 3-dose SCDM series) Never done   Hepatitis B Vaccines 19-59 Average Risk (3 of 3 - 19+ 3-dose series) 10/07/2011   DTaP/Tdap/Td (3 - Tdap) 06/20/2019   COVID-19 Vaccine (5 - 2025-26 season) 07/03/2024   Medicare Annual Wellness (AWV)  10/10/2025   Influenza Vaccine  Completed   HIV Screening  Completed   Pneumococcal Vaccine  Aged Out   Meningococcal B Vaccine  Aged Out        Assessment/Plan:  This is a routine wellness examination for Dragon.  Patient Care Team: Domenica Harlene LABOR, MD as PCP - General (Family Medicine)  I have personally reviewed and noted the following in the patient's chart:   Medical and social history Use of alcohol, tobacco or illicit drugs  Current medications and supplements including opioid prescriptions. Functional ability and status Nutritional status Physical activity Advanced directives List of other physicians Hospitalizations, surgeries, and ER visits in previous 12 months Vitals Screenings to include cognitive, depression, and falls Referrals and appointments  No orders of the defined types were placed in this encounter.  In addition, I have reviewed and discussed with patient certain preventive protocols, quality metrics, and best practice recommendations. A written personalized care plan for preventive services as well as general preventive health recommendations were provided to patient.   Rojelio LELON Blush, LPN   [id number]   Return in 1 year on 10/19/25  After Visit Summary: (MyChart) Due to this being a telephonic visit, the after visit summary with patients personalized plan was offered to patient via MyChart   Nurse Notes: Patient has appointment 01/15/25 to address Health Maintenance

## 2024-11-07 ENCOUNTER — Encounter: Payer: Self-pay | Admitting: Pulmonary Disease

## 2024-11-14 MED ORDER — CEFPODOXIME PROXETIL 200 MG PO TABS: 200.0000 mg | ORAL_TABLET | Freq: Two times a day (BID) | ORAL | 0 refills | Status: AC

## 2024-11-14 MED ORDER — AZITHROMYCIN 250 MG PO TABS: ORAL_TABLET | ORAL | 0 refills | Status: AC

## 2024-11-29 ENCOUNTER — Encounter: Payer: Self-pay | Admitting: Family Medicine

## 2024-11-29 NOTE — Telephone Encounter (Signed)
 Pt's pcp will be changed when the see Dr.blyth for last time

## 2024-12-04 ENCOUNTER — Other Ambulatory Visit: Payer: Self-pay | Admitting: Family Medicine

## 2024-12-04 ENCOUNTER — Encounter: Payer: Self-pay | Admitting: Family Medicine

## 2024-12-04 MED ORDER — FAMOTIDINE 40 MG PO TABS: 40.0000 mg | ORAL_TABLET | Freq: Every day | ORAL | 1 refills | Status: AC

## 2024-12-06 ENCOUNTER — Encounter: Payer: Self-pay | Admitting: Family Medicine

## 2024-12-06 ENCOUNTER — Other Ambulatory Visit: Payer: Self-pay | Admitting: Family Medicine

## 2024-12-06 DIAGNOSIS — R052 Subacute cough: Secondary | ICD-10-CM

## 2024-12-06 MED ORDER — HYDROCODONE BIT-HOMATROP MBR 5-1.5 MG/5ML PO SOLN: 5.0000 mL | Freq: Four times a day (QID) | ORAL | 0 refills | Status: AC | PRN

## 2025-01-15 ENCOUNTER — Ambulatory Visit: Admitting: Family Medicine

## 2025-01-18 ENCOUNTER — Ambulatory Visit: Admitting: Pulmonary Disease

## 2025-01-25 ENCOUNTER — Ambulatory Visit: Admitting: Pulmonary Disease

## 2025-03-05 ENCOUNTER — Other Ambulatory Visit (HOSPITAL_COMMUNITY)

## 2025-10-19 ENCOUNTER — Ambulatory Visit
